# Patient Record
Sex: Male | Born: 1965 | Race: Black or African American | Hispanic: No | Smoking: Current some day smoker
Health system: Southern US, Community
[De-identification: ages and names within clinical notes are randomized; demographics above are authoritative.]

## PROBLEM LIST (undated history)

## (undated) DIAGNOSIS — M109 Gout, unspecified: Secondary | ICD-10-CM

## (undated) DIAGNOSIS — F1010 Alcohol abuse, uncomplicated: Secondary | ICD-10-CM

## (undated) DIAGNOSIS — G4733 Obstructive sleep apnea (adult) (pediatric): Secondary | ICD-10-CM

## (undated) DIAGNOSIS — Z91148 Patient's other noncompliance with medication regimen for other reason: Secondary | ICD-10-CM

## (undated) DIAGNOSIS — I2699 Other pulmonary embolism without acute cor pulmonale: Secondary | ICD-10-CM

## (undated) DIAGNOSIS — I4891 Unspecified atrial fibrillation: Secondary | ICD-10-CM

## (undated) DIAGNOSIS — F411 Generalized anxiety disorder: Secondary | ICD-10-CM

## (undated) DIAGNOSIS — E669 Obesity, unspecified: Secondary | ICD-10-CM

## (undated) DIAGNOSIS — E119 Type 2 diabetes mellitus without complications: Secondary | ICD-10-CM

## (undated) DIAGNOSIS — I5022 Chronic systolic (congestive) heart failure: Secondary | ICD-10-CM

## (undated) DIAGNOSIS — R609 Edema, unspecified: Secondary | ICD-10-CM

## (undated) DIAGNOSIS — R0602 Shortness of breath: Secondary | ICD-10-CM

## (undated) DIAGNOSIS — F99 Mental disorder, not otherwise specified: Secondary | ICD-10-CM

## (undated) DIAGNOSIS — I509 Heart failure, unspecified: Secondary | ICD-10-CM

## (undated) DIAGNOSIS — Z9114 Patient's other noncompliance with medication regimen: Secondary | ICD-10-CM

## (undated) HISTORY — DX: Chronic systolic (congestive) heart failure: I50.22

## (undated) HISTORY — DX: Patient's other noncompliance with medication regimen: Z91.14

## (undated) HISTORY — DX: Alcohol abuse, uncomplicated: F10.10

## (undated) HISTORY — DX: Edema, unspecified: R60.9

## (undated) HISTORY — DX: Obstructive sleep apnea (adult) (pediatric): G47.33

## (undated) HISTORY — DX: Unspecified atrial fibrillation: I48.91

## (undated) HISTORY — DX: Type 2 diabetes mellitus without complications: E11.9

## (undated) HISTORY — DX: Generalized anxiety disorder: F41.1

## (undated) HISTORY — DX: Mental disorder, not otherwise specified: F99

## (undated) HISTORY — DX: Obesity, unspecified: E66.9

## (undated) HISTORY — DX: Patient's other noncompliance with medication regimen for other reason: Z91.148

## (undated) HISTORY — PX: PACEMAKER INSERTION: SHX728

## (undated) HISTORY — DX: Shortness of breath: R06.02

## (undated) HISTORY — PX: TESTICLE SURGERY: SHX794

## (undated) HISTORY — PX: CARDIAC CATHETERIZATION: SHX172

---

## 2002-05-29 ENCOUNTER — Inpatient Hospital Stay (HOSPITAL_COMMUNITY): Admission: AD | Admit: 2002-05-29 | Discharge: 2002-05-30 | Payer: Self-pay | Admitting: General Surgery

## 2002-10-19 ENCOUNTER — Encounter: Payer: Self-pay | Admitting: Emergency Medicine

## 2002-10-19 ENCOUNTER — Observation Stay (HOSPITAL_COMMUNITY): Admission: EM | Admit: 2002-10-19 | Discharge: 2002-10-20 | Payer: Self-pay | Admitting: Emergency Medicine

## 2003-01-08 ENCOUNTER — Emergency Department (HOSPITAL_COMMUNITY): Admission: EM | Admit: 2003-01-08 | Discharge: 2003-01-08 | Payer: Self-pay | Admitting: Internal Medicine

## 2003-01-08 ENCOUNTER — Encounter: Payer: Self-pay | Admitting: Internal Medicine

## 2003-10-06 ENCOUNTER — Emergency Department (HOSPITAL_COMMUNITY): Admission: EM | Admit: 2003-10-06 | Discharge: 2003-10-06 | Payer: Self-pay | Admitting: *Deleted

## 2003-11-01 ENCOUNTER — Ambulatory Visit (HOSPITAL_COMMUNITY): Admission: RE | Admit: 2003-11-01 | Discharge: 2003-11-01 | Payer: Self-pay | Admitting: Family Medicine

## 2004-11-18 ENCOUNTER — Ambulatory Visit: Payer: Self-pay | Admitting: Family Medicine

## 2005-03-13 ENCOUNTER — Ambulatory Visit: Payer: Self-pay | Admitting: Family Medicine

## 2005-03-13 ENCOUNTER — Ambulatory Visit: Payer: Self-pay | Admitting: Gastroenterology

## 2005-03-13 ENCOUNTER — Observation Stay (HOSPITAL_COMMUNITY): Admission: EM | Admit: 2005-03-13 | Discharge: 2005-03-14 | Payer: Self-pay | Admitting: Emergency Medicine

## 2005-04-29 ENCOUNTER — Emergency Department (HOSPITAL_COMMUNITY): Admission: EM | Admit: 2005-04-29 | Discharge: 2005-04-29 | Payer: Self-pay | Admitting: Emergency Medicine

## 2005-06-25 ENCOUNTER — Ambulatory Visit: Payer: Self-pay | Admitting: Cardiology

## 2005-08-27 ENCOUNTER — Ambulatory Visit: Payer: Self-pay | Admitting: Cardiology

## 2005-09-17 ENCOUNTER — Ambulatory Visit: Payer: Self-pay | Admitting: Cardiology

## 2007-02-03 ENCOUNTER — Ambulatory Visit: Payer: Self-pay | Admitting: Cardiology

## 2007-02-09 ENCOUNTER — Ambulatory Visit: Payer: Self-pay | Admitting: Cardiology

## 2007-02-09 ENCOUNTER — Inpatient Hospital Stay (HOSPITAL_BASED_OUTPATIENT_CLINIC_OR_DEPARTMENT_OTHER): Admission: RE | Admit: 2007-02-09 | Discharge: 2007-02-09 | Payer: Self-pay | Admitting: Cardiology

## 2007-02-16 ENCOUNTER — Ambulatory Visit: Payer: Self-pay | Admitting: Cardiology

## 2007-05-23 ENCOUNTER — Encounter: Payer: Self-pay | Admitting: Cardiology

## 2007-05-24 ENCOUNTER — Encounter: Payer: Self-pay | Admitting: Physician Assistant

## 2007-05-24 ENCOUNTER — Ambulatory Visit: Payer: Self-pay | Admitting: Cardiology

## 2007-09-28 ENCOUNTER — Ambulatory Visit: Payer: Self-pay | Admitting: Cardiology

## 2007-10-08 ENCOUNTER — Ambulatory Visit: Payer: Self-pay | Admitting: Cardiology

## 2008-01-20 ENCOUNTER — Emergency Department (HOSPITAL_COMMUNITY): Admission: EM | Admit: 2008-01-20 | Discharge: 2008-01-21 | Payer: Self-pay | Admitting: Emergency Medicine

## 2008-01-27 ENCOUNTER — Ambulatory Visit (HOSPITAL_COMMUNITY): Payer: Self-pay | Admitting: Psychiatry

## 2008-02-03 ENCOUNTER — Ambulatory Visit (HOSPITAL_COMMUNITY): Payer: Self-pay | Admitting: Psychiatry

## 2008-02-09 ENCOUNTER — Ambulatory Visit (HOSPITAL_COMMUNITY): Payer: Self-pay | Admitting: Psychiatry

## 2008-02-10 ENCOUNTER — Ambulatory Visit (HOSPITAL_COMMUNITY): Payer: Self-pay | Admitting: Psychiatry

## 2008-02-16 ENCOUNTER — Ambulatory Visit (HOSPITAL_COMMUNITY): Payer: Self-pay | Admitting: Psychiatry

## 2008-02-23 ENCOUNTER — Ambulatory Visit (HOSPITAL_COMMUNITY): Payer: Self-pay | Admitting: Psychiatry

## 2008-02-24 ENCOUNTER — Ambulatory Visit (HOSPITAL_COMMUNITY): Payer: Self-pay | Admitting: Psychiatry

## 2008-03-01 ENCOUNTER — Ambulatory Visit (HOSPITAL_COMMUNITY): Payer: Self-pay | Admitting: Psychiatry

## 2008-03-23 ENCOUNTER — Ambulatory Visit (HOSPITAL_COMMUNITY): Payer: Self-pay | Admitting: Psychiatry

## 2008-04-06 ENCOUNTER — Ambulatory Visit (HOSPITAL_COMMUNITY): Payer: Self-pay | Admitting: Psychiatry

## 2008-06-20 ENCOUNTER — Encounter: Payer: Self-pay | Admitting: Cardiology

## 2009-02-20 ENCOUNTER — Encounter: Payer: Self-pay | Admitting: Cardiology

## 2009-11-20 ENCOUNTER — Encounter: Payer: Self-pay | Admitting: Family Medicine

## 2010-03-19 ENCOUNTER — Encounter: Payer: Self-pay | Admitting: Cardiology

## 2010-03-19 ENCOUNTER — Emergency Department (HOSPITAL_COMMUNITY): Admission: EM | Admit: 2010-03-19 | Discharge: 2010-03-19 | Payer: Self-pay | Admitting: Emergency Medicine

## 2010-03-20 ENCOUNTER — Inpatient Hospital Stay (HOSPITAL_COMMUNITY): Admission: EM | Admit: 2010-03-20 | Discharge: 2010-03-22 | Payer: Self-pay | Admitting: Emergency Medicine

## 2010-03-20 ENCOUNTER — Encounter: Payer: Self-pay | Admitting: Cardiology

## 2010-03-22 ENCOUNTER — Encounter: Payer: Self-pay | Admitting: Cardiology

## 2010-06-11 DIAGNOSIS — I5042 Chronic combined systolic (congestive) and diastolic (congestive) heart failure: Secondary | ICD-10-CM | POA: Insufficient documentation

## 2010-06-11 DIAGNOSIS — I10 Essential (primary) hypertension: Secondary | ICD-10-CM | POA: Insufficient documentation

## 2010-06-11 DIAGNOSIS — K219 Gastro-esophageal reflux disease without esophagitis: Secondary | ICD-10-CM | POA: Insufficient documentation

## 2010-06-11 DIAGNOSIS — G4733 Obstructive sleep apnea (adult) (pediatric): Secondary | ICD-10-CM | POA: Insufficient documentation

## 2010-06-11 DIAGNOSIS — R079 Chest pain, unspecified: Secondary | ICD-10-CM | POA: Insufficient documentation

## 2010-06-11 DIAGNOSIS — I5040 Unspecified combined systolic (congestive) and diastolic (congestive) heart failure: Secondary | ICD-10-CM | POA: Insufficient documentation

## 2010-06-12 ENCOUNTER — Ambulatory Visit: Payer: Self-pay | Admitting: Cardiology

## 2010-06-12 DIAGNOSIS — I5043 Acute on chronic combined systolic (congestive) and diastolic (congestive) heart failure: Secondary | ICD-10-CM | POA: Insufficient documentation

## 2010-06-12 DIAGNOSIS — F411 Generalized anxiety disorder: Secondary | ICD-10-CM | POA: Insufficient documentation

## 2010-06-14 ENCOUNTER — Telehealth (INDEPENDENT_AMBULATORY_CARE_PROVIDER_SITE_OTHER): Payer: Self-pay | Admitting: *Deleted

## 2010-06-14 ENCOUNTER — Ambulatory Visit: Payer: Self-pay | Admitting: Cardiology

## 2010-06-26 ENCOUNTER — Encounter: Payer: Self-pay | Admitting: Cardiology

## 2010-06-27 ENCOUNTER — Telehealth (INDEPENDENT_AMBULATORY_CARE_PROVIDER_SITE_OTHER): Payer: Self-pay | Admitting: *Deleted

## 2010-06-27 ENCOUNTER — Inpatient Hospital Stay (HOSPITAL_COMMUNITY): Admission: AD | Admit: 2010-06-27 | Discharge: 2010-07-02 | Payer: Self-pay | Admitting: Cardiology

## 2010-06-27 ENCOUNTER — Encounter: Payer: Self-pay | Admitting: Internal Medicine

## 2010-06-27 ENCOUNTER — Ambulatory Visit: Payer: Self-pay | Admitting: Cardiology

## 2010-07-02 ENCOUNTER — Encounter: Payer: Self-pay | Admitting: Internal Medicine

## 2010-07-08 ENCOUNTER — Encounter: Payer: Self-pay | Admitting: Cardiology

## 2010-07-19 ENCOUNTER — Ambulatory Visit: Payer: Self-pay | Admitting: Cardiology

## 2010-07-29 ENCOUNTER — Telehealth (INDEPENDENT_AMBULATORY_CARE_PROVIDER_SITE_OTHER): Payer: Self-pay | Admitting: *Deleted

## 2010-07-31 ENCOUNTER — Encounter: Payer: Self-pay | Admitting: Cardiology

## 2010-08-13 ENCOUNTER — Encounter: Payer: Self-pay | Admitting: Cardiology

## 2010-09-11 ENCOUNTER — Telehealth (INDEPENDENT_AMBULATORY_CARE_PROVIDER_SITE_OTHER): Payer: Self-pay | Admitting: *Deleted

## 2010-09-12 ENCOUNTER — Encounter: Payer: Self-pay | Admitting: Cardiology

## 2010-09-13 ENCOUNTER — Ambulatory Visit: Payer: Self-pay | Admitting: Cardiology

## 2010-09-14 ENCOUNTER — Encounter: Payer: Self-pay | Admitting: Cardiology

## 2010-09-16 ENCOUNTER — Telehealth (INDEPENDENT_AMBULATORY_CARE_PROVIDER_SITE_OTHER): Payer: Self-pay | Admitting: *Deleted

## 2010-09-18 ENCOUNTER — Ambulatory Visit: Payer: Self-pay | Admitting: Cardiology

## 2010-09-18 DIAGNOSIS — I4891 Unspecified atrial fibrillation: Secondary | ICD-10-CM | POA: Insufficient documentation

## 2010-10-30 ENCOUNTER — Ambulatory Visit: Payer: Self-pay | Admitting: Cardiology

## 2010-11-13 ENCOUNTER — Telehealth (INDEPENDENT_AMBULATORY_CARE_PROVIDER_SITE_OTHER): Payer: Self-pay | Admitting: *Deleted

## 2010-12-21 ENCOUNTER — Encounter: Payer: Self-pay | Admitting: Family Medicine

## 2010-12-22 ENCOUNTER — Encounter: Payer: Self-pay | Admitting: Family Medicine

## 2010-12-31 NOTE — Miscellaneous (Signed)
Summary: Orders Update - cardiac rehab  Clinical Lists Changes  Orders: Added new Referral order of Cardiac Rehabilitation (Cardiac Rehab) - Signed 

## 2010-12-31 NOTE — Progress Notes (Signed)
Summary: panic attacks  Phone Note Call from Patient   Summary of Call: States having really bad panic attacks lately over last 2 weeks.  Feelis like heart is racing.  Not sleeping good at night due to this.   Hoover Brunette, LPN  September 11, 2010 3:19 PM   Called back stating he was on his way to ER and wanted to let Dr. Andee Lineman know.  Having chest pain and sweating.  Does not have NTG, but did take ASA.  Wife driving him.  Advised him he was doing the correct thing and that I would let MD know.   Initial call taken by: Hoover Brunette, LPN,  September 12, 2010 3:53 PM  Follow-up for Phone Call        taken care of the hospital Follow-up by: Lewayne Bunting, MD, [facility],  September 13, 2010 11:59 AM

## 2010-12-31 NOTE — Assessment & Plan Note (Signed)
Summary: eph d/c [facility] 8-2   Visit Type:  Follow-up Primary Provider:  [name]   History of Present Illness: the patient was recently admitted to [facility] for increased shortness of breath in the setting of nonischemic cardio myopathy with an ejection fraction of 30%.  The patient was electively admitted for dobutamine infusion.  There were also some minor adjustments made in his medications.  Unfortunately patient has a history of medication nonadherence.  He states it is feeling much better after his recent hospitalization.  He denies any palpitations or syncope.  There is some improvement in his edema. is currently taking torsemide.  The patient wants a referral to cardiac rehab.  He also reports constipation.  Preventive Screening-Counseling & Management  Alcohol-Tobacco     Smoking Status: current     Smoking Cessation Counseling: yes     Packs/Day: 1 PPD  Current Medications (verified): 1)  Amlodipine Besylate 10 Mg Tabs (Amlodipine Besylate) .... Take 1 Tablet By Mouth Once A Day 2)  Aspir-Low 81 Mg Tbec (Aspirin) .... Take 1 Tablet By Mouth Once A Day 3)  Carvedilol 6.25 Mg Tabs (Carvedilol) .... Take 1 Tablet By Mouth Two Times A Day 4)  Diovan 80 Mg Tabs (Valsartan) .... Take 1 Tablet By Mouth Two Times A Day 5)  Excedrin Migraine 250-250-65 Mg Tabs (Aspirin-Acetaminophen-Caffeine) .... As Needed 6)  Pantoprazole Sodium 40 Mg Tbec (Pantoprazole Sodium) .... Take 1 Tablet By Mouth Once A Day 7)  Torsemide 20 Mg Tabs (Torsemide) .... Take 1 Tablet By Mouth Once A Day 8)  Clonazepam 0.5 Mg Tabs (Clonazepam) .... Take 1 Tablet By Mouth Two Times A Day 9)  Spironolactone 25 Mg Tabs (Spironolactone) .... Take 1 Tablet By Mouth Once A Day 10)  Clonazepam 0.5 Mg Tbdp (Clonazepam) .... Take 1 Tablet By Mouth Two Times A Day 11)  Miralax  Powd (Polyethylene Glycol 3350) .... Take 17gm Daily, Mix With 8oz Water  Allergies (verified): 1)  ! Ace Inhibitors 2)  ! *  Buspirone  Comments:  Nurse/Medical Assistant: The patient's medication bottles and allergies were reviewed with the patient and were updated in the Medication and Allergy Lists.  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  The patient denies fatigue, malaise, fever, vision loss, decreased hearing, hoarseness, chest pain, palpitations, prolonged cough, wheezing, sleep apnea, coughing up blood, abdominal pain, blood in stool, nausea, vomiting, diarrhea, heartburn, incontinence, blood in urine, muscle weakness, joint pain, rash, skin lesions, headache, fainting, dizziness, depression, anxiety, enlarged lymph nodes, easy bruising or bleeding, and environmental allergies.    Vital Signs:  Patient profile:   45 year old male Height:      68 inches Weight:      416 pounds Pulse rate:   83 / minute BP sitting:   133 / 83  (left arm) Cuff size:   large  Vitals Entered By: Carlye Grippe (July 19, 2010 1:26 PM)  Physical Exam  Additional Exam:  General: Well-developed, well-nourished in no distress head: Normocephalic and atraumatic eyes PERRLA/EOMI intact, conjunctiva and lids normal nose: No deformity or lesions mouth normal dentition, normal posterior pharynx neck: Supple,  no JVD.  No masses, thyromegaly or abnormal cervical nodes lungs: Normal breath sounds bilaterally without wheezing.  Normal percussion heart: regular rate and rhythm with normal S1 and S2, no S3 or S4.  PMI is normal.  No pathological murmurs abdomen: Normal bowel sounds, abdomen is  soft and nontender without masses, organomegaly or hernias noted.  No hepatosplenomegaly musculoskeletal: Back normal, normal gait muscle strength and tone normal pulsus: Pulse is normal in all 4 extremities Extremities: 2+ peripheral pitting edema neurologic: Alert and oriented x 3 skin: Intact without lesions or rashes cervical nodes: No significant adenopathy psychologic: Normal affect    Impression & Recommendations:  Problem # 1:  ACUTE ON CHRONIC SYSTOLIC HEART FAILURE (ICD-428.23) improved with intravenous dobutamine.  Patient NYHA class two.  Continue medical therapywill have discussions in the future whether the patient could be a candidate for ICD.  However he has history of significant noncompliance and we want to further evaluate improvement ejection fraction of medical therapy His updated medication list for this problem includes:    Amlodipine Besylate 10 Mg Tabs (Amlodipine besylate) .Marland Kitchen... Take 1 tablet by mouth once a day    Aspir-low 81 Mg Tbec (Aspirin) .Marland Kitchen... Take 1 tablet by mouth once a day    Carvedilol 6.25 Mg Tabs (Carvedilol) .Marland Kitchen... Take 1 tablet by mouth two times a day    Diovan 80 Mg Tabs (Valsartan) .Marland Kitchen... Take 1 tablet by mouth two times a day    Torsemide 20 Mg Tabs (Torsemide) .Marland Kitchen... Take 1 tablet by mouth once a day    Spironolactone 25 Mg Tabs (Spironolactone) .Marland Kitchen... Take 1 tablet by mouth once a day  Problem # 2:  UNSPECIFIED ESSENTIAL HYPERTENSION (ICD-401.9) blood pressure is controlled.  Continue current medical therapy His updated medication list for this problem includes:    Amlodipine Besylate 10 Mg Tabs (Amlodipine besylate) .Marland Kitchen... Take 1 tablet by mouth once a day    Aspir-low 81 Mg Tbec (Aspirin) .Marland Kitchen... Take 1 tablet by mouth once a day    Carvedilol 6.25 Mg Tabs (Carvedilol) .Marland Kitchen... Take 1 tablet by mouth two times a day    Diovan 80 Mg Tabs (Valsartan) .Marland Kitchen... Take 1 tablet by mouth two times a day    Torsemide 20 Mg Tabs (Torsemide) .Marland Kitchen... Take 1  tablet by mouth once a day    Spironolactone 25 Mg Tabs (Spironolactone) .Marland Kitchen... Take 1 tablet by mouth once a day  Problem # 3:  MORBID OBESITY (ICD-278.01) we discussed weight reduction.  Problem # 4:  UNSPECIFIED SLEEP APNEA (ICD-780.57) the patient needs to be compliant with his CPAP device.  Explained to him that this is contributing to his heart failure symptoms.  Problem # 5:  ANXIETY STATE, UNSPECIFIED (ICD-300.00) patient a refill on his clonazepam.  Patient Instructions: 1)  Clonazepam 0.5mg  two times a day (60 tabs + 1 refill) 2)  Miralax 17gm daily 3)  Referral to Cardiac Rehab 4)  Follow up in  6 months  Prescriptions: MIRALAX  POWD (POLYETHYLENE GLYCOL 3350) take 17gm daily, mix with 8oz water  #1 bottle x 6   Entered by:   Hoover Brunette, LPN   Authorized by:   Lewayne Bunting, MD, [facility]   Signed by:   Hoover Brunette, LPN on 16/09/9603   Method used:   Electronically to        [facility]  [id number] (retail)       [address]  [place], [place]  [address]       Ph: [phone] or [phone]       Fax: [phone]   RxID:   [id number]   Handout requested. CLONAZEPAM 0.5 MG TBDP (CLONAZEPAM) Take 1 tablet by mouth two times a day  #60 x 1   Entered by:   Hoover Brunette, LPN   Authorized by:   Lewayne Bunting, MD, [facility]   Signed by:   Hoover Brunette, LPN on 66/05/3015   Method used:   Print then Give to Patient   RxID:   [id number]   Handout requested.

## 2010-12-31 NOTE — Assessment & Plan Note (Signed)
Summary: f6w  --agh   Visit Type:  Follow-up Primary Provider:  [name]   History of Present Illness: the patient is a 45 year old male with history of nonischemic cardiomyopathy, ejection fraction approximately 25-30%. The patient was admitted to September 12, 2010 with rapid atrial fibrillation panic attacks. He has a CHADS2 of 2 but has been noncompliant with medical therapy and is not felt to be a particular good candidate for Coumadin. The patient is currently back in normal sinus rhythm.  the patient has been treated also for significant anxiety and has much improved with clonazepam and citalopram. Today however he complains of a severe headache which has been going on for several days on the left side of his face radiating to the back and neck associated with some blurred vision. The patient states that he has had migraine headaches in the past. This episode however typically severe.  The patient remains in normal sinus rhythm on EKG. From a heart failure standpoint is doing much better. He has no chest pain short of breath orthopnea PND.  Preventive Screening-Counseling & Management  Alcohol-Tobacco     Smoking Status: current     Smoking Cessation Counseling: yes     Packs/Day: 1/2 PPD  Current Medications (verified): 1)  Amlodipine Besylate 10 Mg Tabs (Amlodipine Besylate) .... Take 1 Tablet By Mouth Once A Day 2)  Aspir-Low 81 Mg Tbec (Aspirin) .... Take 1 Tablet By Mouth Once A Day 3)  Diovan 80 Mg Tabs (Valsartan) .... Take 1 Tablet By Mouth Two Times A Day 4)  Excedrin Migraine 250-250-65 Mg Tabs (Aspirin-Acetaminophen-Caffeine) .... As Needed 5)  Pantoprazole Sodium 40 Mg Tbec (Pantoprazole Sodium) .... Take 1 Tablet By Mouth Once A Day 6)  Torsemide 20 Mg Tabs (Torsemide) .... Take 1 Tablet By Mouth Once A Day 7)  Spironolactone 25 Mg Tabs (Spironolactone) .... Take 1 Tablet By Mouth Once A Day 8)  Clonazepam 1 Mg Tabs (Clonazepam) .... Take One Tab Three Times A Day 9)   Miralax  Powd (Polyethylene Glycol 3350) .... Take 17gm Daily, Mix With 8oz Water 10)  Metoprolol Succinate 50 Mg Xr24h-Tab (Metoprolol Succinate) .... Take 1 Tablet By Mouth Two Times A Day 11)  Citalopram Hydrobromide 20 Mg Tabs (Citalopram Hydrobromide) .... Take 1 Tablet By Mouth Once A Day 12)  Percocet 7.5-500 Mg Tabs (Oxycodone-Acetaminophen) .... One Tab Every 6 Hours As Needed For Severe Headache  Allergies (verified): 1)  ! Ace Inhibitors 2)  ! * Buspirone  Comments:  Nurse/Medical Assistant: The patient's medication bottles and allergies were reviewed with the patient and were updated in the Medication and Allergy Lists.  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      Social History: Last updated: 06/11/2010 Patient has 3 children and disabled Tobacco Use - Yes.  Alcohol Use - yes  Risk Factors: Smoking Status: current (10/30/2010) Packs/Day: 1/2 PPD (10/30/2010)  Social History: Packs/Day:  1/2 PPD  Review of Systems  The patient complains of weight gain/loss, headache, and anxiety.  The patient denies fatigue, malaise, fever, vision loss, decreased hearing, hoarseness, chest pain, palpitations, shortness of breath, prolonged cough, wheezing, sleep apnea, coughing up blood, abdominal pain, blood in  stool, nausea, vomiting, diarrhea, heartburn, incontinence, blood in urine, muscle weakness, joint pain, leg swelling, rash, skin lesions, fainting, depression, enlarged lymph nodes, easy bruising or bleeding, and environmental allergies.    Vital Signs:  Patient profile:   45 year old male Height:      68 inches Weight:      416 pounds Pulse rate:   66 / minute BP sitting:   135 / 90  (left arm) Cuff size:   large  Vitals Entered By: Carlye Grippe (October 30, 2010 2:12 PM)  Physical Exam  Additional Exam:  General: Well-developed, well-nourished in no distress head: Normocephalic and atraumatic eyes PERRLA/EOMI intact, conjunctiva and lids normal nose: No deformity or lesions mouth normal dentition, normal posterior pharynx neck: Supple, no JVD.  No masses, thyromegaly or abnormal cervical nodes lungs: Normal breath sounds bilaterally without wheezing.  Normal percussion heart: regular rate and rhythm with normal S1 and S2, no S3 or S4.  PMI is normal.  No pathological murmurs abdomen: Normal bowel sounds, abdomen is soft and nontender without masses, organomegaly or hernias noted.  No hepatosplenomegaly musculoskeletal: Back normal, normal gait muscle strength and tone normal pulsus: Pulse is normal in all 4 extremities Extremities: 1+ peripheral pitting edema neurologic: Alert and oriented x 3 skin: Intact without lesions or rashes cervical nodes: No significant adenopathy psychologic: Normal affect    EKG  Procedure date:  10/30/2010  Findings:      normal sinus rhythm. Left ventricle hypertrophy. Nonspecific T-wave changes. Heart rate 63 beats per minute  Impression & Recommendations:  Problem # 1:  ATRIAL FIBRILLATION, PAROXYSMAL (ICD-427.31) patient remains in normal sinus rhythm. If he remains compliant with his current medical regimen which he has been for a while now and we can consider the issue of Coumadin. His updated medication list for this problem  includes:    Aspir-low 81 Mg Tbec (Aspirin) .Marland Kitchen... Take 1 tablet by mouth once a day    Metoprolol Succinate 50 Mg Xr24h-tab (Metoprolol succinate) .Marland Kitchen... Take 1 tablet by mouth two times a day  Orders: EKG w/ Interpretation (93000)  Problem # 2:  ANXIETY STATE, UNSPECIFIED (ICD-300.00) anxiety is markedly improved. The patient will continue clonazepam and citalopram  Problem # 3:  ACUTE ON CHRONIC SYSTOLIC HEART FAILURE (ICD-428.23) ocular symptoms are stable. He is a good medical regimen. If he remains compliant with also readdress the issue of ICD placement. I encouraged the patient meanwhile to exercise and is able to walk 2 miles during several days of the week. His updated medication list for this problem includes:    Amlodipine Besylate 10 Mg Tabs (Amlodipine besylate) .Marland Kitchen... Take 1 tablet by mouth once a day    Aspir-low 81 Mg Tbec (Aspirin) .Marland Kitchen... Take 1 tablet by mouth once a day    Diovan 80 Mg Tabs (Valsartan) .Marland Kitchen... Take 1 tablet by mouth two times a day    Torsemide 20 Mg Tabs (Torsemide) .Marland Kitchen... Take 1 tablet by mouth once a day    Spironolactone 25 Mg Tabs (Spironolactone) .Marland Kitchen... Take 1 tablet by mouth once a day    Metoprolol Succinate 50 Mg Xr24h-tab (Metoprolol succinate) .Marland Kitchen... Take 1 tablet by mouth two times a day  Patient Instructions: 1)  Percocet 7.5/500mg  for migrain 2)  Follow up in  6 months Prescriptions: PERCOCET 7.5-500 MG TABS (OXYCODONE-ACETAMINOPHEN) one tab every 6 hours as needed for severe headache  #10 x 0   Entered by:   Hoover Brunette, LPN   Authorized by:   Lewayne Bunting, MD, [facility]   Signed by:   Hoover Brunette, LPN on 16/09/9603   Method used:   Print then Give to Patient   RxID:   [id number]

## 2010-12-31 NOTE — Progress Notes (Signed)
Summary: Order for cardiac rehab  Phone Note Call from Patient Call back at Home Phone [phone]   Reason for Call: Talk to Nurse Summary of Call: Patient said we were to refer him to cardiac rehab. He called to say they have not recieved the order. Let him know when it is sent. Initial call taken by: Dorise Hiss,  July 29, 2010 9:06 AM  Follow-up for Phone Call        Patient notified.    Hoover Brunette, LPN  July 31, 2010 12:15 PM

## 2010-12-31 NOTE — Consult Note (Signed)
Summary: CARDIOLOGY CONSULT/ [facility]  CARDIOLOGY CONSULT/ [facility]   Imported By: Zachary George 06/11/2010 17:55:04  _____________________________________________________________________  External Attachment:    Type:   Image     Comment:   External Document

## 2010-12-31 NOTE — Assessment & Plan Note (Signed)
Summary: 2 yr fu-was inpt [facility] april 2011   Visit Type:  Follow-up Primary Provider:  [name]   History of Present Illness: the patient is a 45 year old African-American male with history of nonischemic cardiomyopathy.  The patient had a normal prior cardiac catheterization.  He has a history of alcohol use and has been noncompliant in the past.  He also has a component of hypertrophic cardiomyopathy.  Ejection fraction is 30%.  The patient has morbid obesity and marked deconditioning.  He also has obstructive sleep apnea and is on CPAP.  The patient is currently on disability.  He reports significant anxiety.  His increased shortness of breath and evidence of increased volume overload.  However is very worried about his health and has developed episodes of agoraphobia and claustrophobia.  He is ruminating thoughts insomnia.  Reportedly has been tried on multiple medications by his primary care physician including Seroquel, Thorazine as well as citalopram the latter which made suicidal.  The patient now has mainly anxiety.  Cardiovascular standpoint is very short of breath on minimal exertion denies chest pain.  Preventive Screening-Counseling & Management  Alcohol-Tobacco     Smoking Status: current     Smoking Cessation Counseling: yes     Packs/Day: 1 PPD  Current Medications (verified): 1)  Amlodipine Besylate 10 Mg Tabs (Amlodipine Besylate) .... Take 1 Tablet By Mouth Once A Day 2)  Aspir-Low 81 Mg Tbec (Aspirin) .... Take 1 Tablet By Mouth Once A Day 3)  Carvedilol 12.5 Mg Tabs (Carvedilol) .... Take 1 Tablet By Mouth Two Times A Day 4)  Diovan Hct 160-25 Mg Tabs (Valsartan-Hydrochlorothiazide) .... Take 1 Tablet By Mouth Once A Day 5)  Excedrin Migraine 250-250-65 Mg Tabs (Aspirin-Acetaminophen-Caffeine) .... As Needed 6)  Klor-Con M20 20 Meq Cr-Tabs (Potassium Chloride Crys Cr) .... Take 1 Tablet By Mouth Once A Day 7)  Pantoprazole Sodium 40 Mg Tbec (Pantoprazole Sodium) ....  Take 1 Tablet By Mouth Once A Day 8)  Torsemide 20 Mg Tabs (Torsemide) .... Take 1 Tablet By Mouth Once A Day 9)  Clonazepam 0.5 Mg Tabs (Clonazepam) .... Take 1/2 Tab (0.25mg ) Two Times A Day  Allergies (verified): 1)  ! Ace Inhibitors 2)  ! * Buspirone  Comments:  Nurse/Medical Assistant: The patient's medication bottles and allergies were reviewed with the patient and were updated in the Medication and Allergy Lists.  Past History:  Past Medical History: Last updated: 06/11/2010 Severe nonischemic cardiomypathy EF 30% with CWP 19 Medication noncompliance Alcohol abuse Chronic systolic heart failure Hypertension Obstructive sleep apnea Morbid obesity Psychiatric disorder Hypertriglyceridemia chronic lower extremity edema  Past Surgical History: Last updated: 06/11/2010 Cardiac Catheterization 2008  Family History: Last updated: 06/11/2010   There is history of hypertension, stroke, and diabetes   runs in his family.      Social History: Last updated: 06/11/2010 Patient has 3 children and disabled Tobacco Use - Yes.  Alcohol Use - yes  Risk Factors: Smoking Status: current (06/12/2010) Packs/Day: 1 PPD (06/12/2010)  Social History: Packs/Day:  1 PPD  Review of Systems       The patient complains of weight gain/loss, shortness of breath, sleep apnea, and anxiety.  The patient denies fatigue, malaise, fever, vision loss, decreased hearing, hoarseness, chest pain, palpitations, prolonged cough, wheezing, coughing up blood, abdominal pain, blood in stool, nausea, vomiting, diarrhea, heartburn, incontinence, blood in urine, muscle weakness, joint pain, leg swelling, rash, skin lesions, headache, fainting, dizziness, depression, enlarged lymph nodes, easy bruising or bleeding, and environmental  allergies.    Vital Signs:  Patient profile:   45 year old male Height:      68 inches Weight:      415 pounds BMI:     63.33 Pulse rate:   80 / minute BP sitting:    120 / 78  (left arm) Cuff size:   large  Vitals Entered By: Carlye Grippe (June 12, 2010 2:11 PM)  Nutrition Counseling: Patient's BMI is greater than 25 and therefore counseled on weight management options.  Physical Exam  Additional Exam:  General: Well-developed, well-nourished in no distress head: Normocephalic and atraumatic eyes PERRLA/EOMI intact, conjunctiva and lids normal nose: No deformity or lesions mouth normal dentition, normal posterior pharynx neck: Supple, no JVD.  No masses, thyromegaly or abnormal cervical nodes lungs: Normal breath sounds bilaterally without wheezing.  Normal percussion heart: regular rate and rhythm with normal S1 and S2, no S3 or S4.  PMI is normal.  No pathological murmurs abdomen: Normal bowel sounds, abdomen is soft and nontender without masses, organomegaly or hernias noted.  No hepatosplenomegaly musculoskeletal: Back normal, normal gait muscle strength and tone normal pulsus: Pulse is normal in all 4 extremities Extremities: 2+ peripheral pitting edema neurologic: Alert and oriented x 3 skin: Intact without lesions or rashes cervical nodes: No significant adenopathy psychologic: Normal affect    Impression & Recommendations:  Problem # 1:  ACUTE ON CHRONIC SYSTOLIC HEART FAILURE (ICD-428.23) will increase Diovan hydrochlorothiazide and have the patient follow up quickly as he may require intravenous Lasix.  We will also get an echocardiogram to reassess his LV function.  He has not abstained from alcohol he may be a candidate for ICD implantation. His updated medication list for this problem includes:    Amlodipine Besylate 10 Mg Tabs (Amlodipine besylate) .Marland Kitchen... Take 1 tablet by mouth once a day    Aspir-low 81 Mg Tbec (Aspirin) .Marland Kitchen... Take 1 tablet by mouth once a day    Carvedilol 12.5 Mg Tabs (Carvedilol) .Marland Kitchen... Take 1 tablet by mouth two times a day    Diovan Hct 160-25 Mg Tabs (Valsartan-hydrochlorothiazide) .Marland Kitchen... Take 1 tablet by  mouth once a day    Torsemide 20 Mg Tabs (Torsemide) .Marland Kitchen... Take 1 tablet by mouth once a day  Orders: 2-D Echocardiogram (2D Echo) T-Basic Metabolic Panel ([address]-60454) T-BNP  (B Natriuretic Peptide) (09811-91478)  Problem # 2:  MORBID OBESITY (ICD-278.01) I counseled the patient regarding his weight.  Unfortunately continues to smoke also  Problem # 3:  ANXIETY STATE, UNSPECIFIED (ICD-300.00) again the patient a limited prescription for clonazepam.  Short-term only  Patient Instructions: 1)  Clonazepam 0.25mg  two times a day  2)  Increase Diovan to 160/25mg  daily 3)  2D Echo 4)  Labs - can do with Echo  5)  Follow up in  4 weeks. Prescriptions: DIOVAN HCT 160-25 MG TABS (VALSARTAN-HYDROCHLOROTHIAZIDE) Take 1 tablet by mouth once a day  #30 x 6   Entered by:   Hoover Brunette, LPN   Authorized by:   Lewayne Bunting, MD, [facility]   Signed by:   Hoover Brunette, LPN on 29/56/2130   Method used:   [facility]  [address] [address] [id number]       [address]       [place], [place]  [address]       Ph: [phone] or [phone]  Fax: [phone]   RxID:   [id number] CLONAZEPAM 0.5 MG TABS (CLONAZEPAM) take 1/2 tab (0.25mg ) two times a day  #15 x 0   Entered by:   Hoover Brunette, LPN   Authorized by:   Lewayne Bunting, MD, [facility]   Signed by:   Hoover Brunette, LPN on 30/86/5784   Method used:   Print then Give to Patient   RxID:   [id number]   Handout requested.   Appended Document: 2 yr fu-was in[facility] april 2011 due to worsening of the patient's clinical condition and worsening heart failure as well as significant anxiety associated with this and agoraphobia, the patient is unable to be present in public places. At least for the time being until treatment has taken effect. The patient reported to me that he has a court appearance. I feel given his medical condition that this would be contraindicated to be present and the exposed to the stress associated  with this. The patient will first need treatment for his heart failure and agoraphobia. He can be released for his appearance at a later date. This date will need to be determined.

## 2010-12-31 NOTE — Letter (Signed)
Summary: External Correspondence/ FAXED PULMONARY REHAB REFERRAL  External Correspondence/ FAXED PULMONARY REHAB REFERRAL   Imported By: Dorise Hiss 08/13/2010 14:01:07  _____________________________________________________________________  External Attachment:    Type:   Image     Comment:   External Document

## 2010-12-31 NOTE — Progress Notes (Signed)
Summary: request court letter/dictation  Phone Note Call from Patient   Summary of Call: Patient called checking to see if Dr. Andee Lineman had done a letter for court.  Stated that he told him during his office viisit that he would do one.  Advised pt. that his dictation was complete, but no formal letter done.  Will provide copy of dictation for pt.  If not sufficient, he will let us know so we can forward to MD to request formal letter to be dictated.  Patient verbalized understanding.  Hoover Brunette, LPN  June 14, 2010 2:33 PM

## 2010-12-31 NOTE — Letter (Signed)
Summary: External Correspondence/ FAXED [facility]  External Correspondence/ FAXED [facility]   Imported By: Dorise Hiss 07/03/2010 16:38:25  _____________________________________________________________________  External Attachment:    Type:   Image     Comment:   External Document

## 2010-12-31 NOTE — Assessment & Plan Note (Signed)
Summary: clarify meds  --agh   Visit Type:  Follow-up Primary [name]:  [name]   History of Present Illness: the patient is a 45 year old male with history of nonischemic cardiomyopathy, ejection fraction approximately 25-30%. The patient was admitted to September 12, 2010 with rapid atrial fibrillation panic attacks. He has a CHADS2 of 2 but has been noncompliant with medical therapy and is not felt to be a particular good candidate for Coumadin. The patient is currently back in normal sinus rhythm. Several medications adjustments were made during his hospitalization but unfortunately they were not executed upon discharge. The plan was to increase metoprolol increase clonazepam. and add citalopram  for panic attacks. Fortunately the patient has had no more palpitations. He monitors his heart rate with a heart rate monitor. His BNP level during hospitalization was only 61 and clinically he has no congestive heart failure he also has normal renal function. He presents now for followup to straighten out his medical regimen.  Preventive Screening-Counseling & Management  Alcohol-Tobacco     Smoking Status: current     Smoking Cessation Counseling: yes     Packs/Day: 1 PPD  Current Medications (verified): 1)  Amlodipine Besylate 10 Mg Tabs (Amlodipine Besylate) .... Take 1 Tablet By Mouth Once A Day 2)  Aspir-Low 81 Mg Tbec (Aspirin) .... Take 1 Tablet By Mouth Once A Day 3)  Diovan 80 Mg Tabs (Valsartan) .... Take 1 Tablet By Mouth Two Times A Day 4)  Excedrin Migraine 250-250-65 Mg Tabs (Aspirin-Acetaminophen-Caffeine) .... As Needed 5)  Pantoprazole Sodium 40 Mg Tbec (Pantoprazole Sodium) .... Take 1 Tablet By Mouth Once A Day 6)  Torsemide 20 Mg Tabs (Torsemide) .... Take 1 Tablet By Mouth Once A Day 7)  Spironolactone 25 Mg Tabs (Spironolactone) .... Take 1 Tablet By Mouth Once A Day 8)  Clonazepam 1 Mg Tabs (Clonazepam) .... Take One Tab Three Times A Day 9)  Miralax  Powd (Polyethylene  Glycol 3350) .... Take 17gm Daily, Mix With 8oz Water 10)  Metoprolol Succinate 50 Mg Xr24h-Tab (Metoprolol Succinate) .... Take 1 Tablet By Mouth Two Times A Day 11)  Citalopram Hydrobromide 20 Mg Tabs (Citalopram Hydrobromide) .... Take 1 Tablet By Mouth Once A Day  Allergies (verified): 1)  ! Ace Inhibitors 2)  ! * Buspirone  Comments:  Nurse/Medical Assistant: The patient's medication bottles and allergies were reviewed with the patient and were updated in the Medication and Allergy Lists.  Past History:  Past Surgical History: Last updated: 06/11/2010 Cardiac Catheterization 2008  Family History: Last updated: 06/11/2010   There is history of hypertension, stroke, and diabetes   runs in his family.      Social History: Last updated: 06/11/2010 Patient has 3 children and disabled Tobacco Use - Yes.  Alcohol Use - yes  Risk Factors: Smoking Status: current (09/18/2010) Packs/Day: 1 PPD (09/18/2010)  Past Medical History: Severe nonischemic cardiomypathy EF 30% with CWP 19 Medication noncompliance Alcohol abuse Chronic systolic heart failure Hypertension Obstructive sleep apnea Morbid obesity Psychiatric disorder Hypertriglyceridemia chronic lower extremity edema elective admission to [facility] for intravenous dobutamine August 2011 Admission with rapid atrial fibrillation October 2011 at [place], BNP level 61 Creatinine October 2011 1.07  Review of Systems       The patient complains of fatigue, palpitations, and anxiety.  The patient denies malaise, fever, weight gain/loss, vision loss, decreased hearing, hoarseness, chest pain, shortness of breath, prolonged cough, wheezing, sleep apnea, coughing up blood, abdominal pain, blood in stool, nausea, vomiting,  diarrhea, heartburn, incontinence, blood in urine, muscle weakness, joint pain, leg swelling, rash, skin lesions, headache, fainting, dizziness, depression, enlarged lymph nodes, easy bruising or  bleeding, and environmental allergies.    Vital Signs:  Patient profile:   45 year old male Height:      68 inches Weight:      416 pounds Pulse rate:   63 / minute BP sitting:   129 / 85  (left arm) Cuff size:   large  Vitals Entered By: Carlye Grippe (September 18, 2010 9:53 AM)  Physical Exam  Additional Exam:  General: Well-developed, well-nourished in no distress head: Normocephalic and atraumatic eyes PERRLA/EOMI intact, conjunctiva and lids normal nose: No deformity or lesions mouth normal dentition, normal posterior pharynx neck: Supple, no JVD.  No masses, thyromegaly or abnormal cervical nodes lungs: Normal breath sounds bilaterally without wheezing.  Normal percussion heart: regular rate and rhythm with normal S1 and S2, no S3 or S4.  PMI is normal.  No pathological murmurs abdomen: Normal bowel sounds, abdomen is soft and nontender without masses, organomegaly or hernias noted.  No hepatosplenomegaly musculoskeletal: Back normal, normal gait muscle strength and tone normal pulsus: Pulse is normal in all 4 extremities Extremities: 1+ peripheral pitting edema neurologic: Alert and oriented x 3 skin: Intact without lesions or rashes cervical nodes: No significant adenopathy psychologic: Normal affect    Impression & Recommendations:  Problem # 1:  ANXIETY STATE, UNSPECIFIED (ICD-300.00) the patient still has significant anxiety. Unfortunately he's never been started on a medical regimen. We will increase clonazepam to 1 mg p.o. t.i.d. and since start citalopram 20 minutes p.o. q. daily. We will also increase his Toprol to 50 minutes p.o. b.i.d. which could help also with anxiety. We will have to monitor his heart rate and he does not tolerate this regimen then we can add digoxin  Problem # 2:  ATRIAL FIBRILLATION, PAROXYSMAL (ICD-427.31) the patient has paroxysmal atrial ablation and ideally could benefit from Coumadin. However we've already instituted several  medication changes and the patient has a history of noncompliance. We make a definite decision during his next clinic visit in 4-6 weeks we need to start Coumadin. His CHADS2 is 2 The following medications were removed from the medication list:    Carvedilol 6.25 Mg Tabs (Carvedilol) .Marland Kitchen... Take 1 tablet by mouth two times a day His updated medication list for this problem includes:    Aspir-low 81 Mg Tbec (Aspirin) .Marland Kitchen... Take 1 tablet by mouth once a day    Metoprolol Succinate 50 Mg Xr24h-tab (Metoprolol succinate) .Marland Kitchen... Take 1 tablet by mouth two times a day  Problem # 3:  ACUTE ON CHRONIC SYSTOLIC HEART FAILURE (ICD-428.23) patient has chronic systolic heart failure. He is currently not volume overloaded. He has very little peripheral edema and his BNP level is 61. The following medications were removed from the medication list:    Carvedilol 6.25 Mg Tabs (Carvedilol) .Marland Kitchen... Take 1 tablet by mouth two times a day His updated medication list for this problem includes:    Amlodipine Besylate 10 Mg Tabs (Amlodipine besylate) .Marland Kitchen... Take 1 tablet by mouth once a day    Aspir-low 81 Mg Tbec (Aspirin) .Marland Kitchen... Take 1 tablet by mouth once a day    Diovan 80 Mg Tabs (Valsartan) .Marland Kitchen... Take 1 tablet by mouth two times a day    Torsemide 20 Mg Tabs (Torsemide) .Marland Kitchen... Take 1 tablet by mouth once a day    Spironolactone 25 Mg Tabs (Spironolactone) .Marland KitchenMarland KitchenMarland KitchenMarland Kitchen  Take 1 tablet by mouth once a day    Metoprolol Succinate 50 Mg Xr24h-tab (Metoprolol succinate) .Marland Kitchen... Take 1 tablet by mouth two times a day  Patient Instructions: 1)  Increase Metoprolol ER to 50mg  two times a day  2)  Citalopram 20mg  daily 3)  Increase Clonazepam to 1mg  to three times a day  4)  Follow up in  6 wks Prescriptions: CLONAZEPAM 1 MG TABS (CLONAZEPAM) take one tab three times a day  #90 x 2   Entered by:   Hoover Brunette, LPN   Authorized by:   Lewayne Bunting, MD, [facility]   Signed by:   Hoover Brunette, LPN on 16/09/9603   Method used:   Print then Give  to Patient   RxID:   [id number] CITALOPRAM HYDROBROMIDE 20 MG TABS (CITALOPRAM HYDROBROMIDE) Take 1 tablet by mouth once a day  #30 x 2   Entered by:   Hoover Brunette, LPN   Authorized by:   Lewayne Bunting, MD, [facility]   Signed by:   Hoover Brunette, LPN on 21/30/8657   Method used:   [facility]  [address] [facility]       [address]       [place], [place]  [address]       Ph: [phone] or [phone]       Fax: [phone]   RxID:   [id number] METOPROLOL SUCCINATE 50 MG XR24H-TAB (METOPROLOL SUCCINATE) Take 1 tablet by mouth two times a day  #60 x 6   Entered by:   Hoover Brunette, LPN   Authorized by:   Lewayne Bunting, MD, [facility]   Signed by:   Hoover Brunette, LPN on 84/16/6063   Method used:   [facility]  [address] [facility]       [address]       [place], [place]  [address]       Ph: [phone] or [phone]       Fax: [phone]   RxID:   [id number]

## 2010-12-31 NOTE — Consult Note (Signed)
Summary: Consultation Report/ CARDIOLOGY  Consultation Report/ CARDIOLOGY   Imported By: Dorise Hiss 09/16/2010 16:20:44  _____________________________________________________________________  External Attachment:    Type:   Image     Comment:   External Document

## 2010-12-31 NOTE — Miscellaneous (Signed)
Summary: Rehab Report/ FAXED [facility] REFERRAL  Rehab Report/ FAXED REHAB REFERRAL   Imported By: Dorise Hiss 08/13/2010 10:37:39  _____________________________________________________________________  External Attachment:    Type:   Image     Comment:   External Document

## 2010-12-31 NOTE — Progress Notes (Signed)
Summary: UNABLE TO HOLD BED PAST 4:00PM  Phone Note From Other Clinic Call back at [phone]   Caller: Shawn Call For: NURSE Summary of Call: called to inform office that patient still not there for admission. nurse call and left messgage on voicemail and also spoke with mother and she stated that patient was at hopsital at this time for admission. Shawn informed. Carlye Grippe  June 27, 2010 3:24 PM  Initial call taken by: Carlye Grippe,  June 27, 2010 3:09 PM

## 2010-12-31 NOTE — Progress Notes (Signed)
Summary: Pt did not go to [facility] for admission  Phone Note Other Incoming   Summary of Call: Call from Trish at [facility] that pt was not admitted to [facility] last evening as planned. Called pt but no answer. Called bed control. They will try to find out if pt was called for bed placement or what the situation is and contact our office back.   Initial call taken by: Cyril Loosen, RN, BSN,  June 27, 2010 9:49 AM  Follow-up for Phone Call        Shawn with Bed Control at [facility] called back today stating they attempted to reach pt yesterday evening but did not get an answer.  Spoke with pt who states he never received a call from [facility]. Will contact pt once bed is available today.  Follow-up by: Cyril Loosen, RN, BSN,  June 27, 2010 10:06 AM  Additional Follow-up for Phone Call Additional follow up Details #1::        Spoke with Shawn at bed control. Pt has bed available now. Pt notified. He is aware to go to [facility] now for admission. Trish notified. Additional Follow-up by: Cyril Loosen, RN, BSN,  June 27, 2010 10:17 AM

## 2010-12-31 NOTE — Progress Notes (Signed)
Summary: clarify meds  Phone Note Call from Patient   Summary of Call: Saw Dr. Andee Lineman on Friday while in hosp at [facility].  Went home on Saturday.   Said that GD was going to give him two new meds, but did not get scripts.  Dr. Olena Leatherwood d/c on Saturday.  Cardiology consult note states change Coreg to Metoprolol 50mg  two times a day, but was given rx for once daily.  Amlodipine 10mg  once daily, Digoxin - was not given, Citalopram - not given.  Clonazepam - no script.   Please review med list and advise what he needs to be on.  When do you want to see in office again?  ([facility]   Initial call taken by: Hoover Brunette, LPN,  September 16, 2010 4:12 PM  Follow-up for Phone Call        This is infuriating - he need to be on all meds from my consult note. Have him come tomorrow in the AM for me to see for 5 min and get clear on everything.  Follow-up by: Lewayne Bunting, MD, [facility],  September 17, 2010 1:23 PM  Additional Follow-up for Phone Call Additional follow up Details #1::        Patient notified.   Scheduled OV for Wednesday, 10/19 at 10:00.  Advised him to bring all meds and discharge papers given from last hosp. visit.    Additional Follow-up by: Hoover Brunette, LPN,  September 17, 2010 2:33 PM

## 2010-12-31 NOTE — Letter (Signed)
Summary: Discharge Summary  Discharge Summary   Imported By: Dorise Hiss 06/12/2010 09:19:51  _____________________________________________________________________  External Attachment:    Type:   Image     Comment:   External Document

## 2010-12-31 NOTE — Letter (Signed)
Summary: Discharge Summary/ [facility]  Discharge Summary/ [facility]   Imported By: Dorise Hiss 09/16/2010 16:22:01  _____________________________________________________________________  External Attachment:    Type:   Image     Comment:   External Document

## 2011-01-02 NOTE — Progress Notes (Signed)
Summary: cramping in legs  Phone Note Call from Patient   Caller: VOICEMAIL MESSAGE Summary of Call: Complain of having cramps in thighs and legs.  Feels like a charlie horse cramp.  Also, in abdomen last night.  Wonders if he needs to go back on his potassium .  Looks like it was stopped in August.   Hoover Brunette, LPN  November 13, 2010 12:17 PM   Follow-up for Phone Call        Check BMET ASAP, then will decide.  Follow-up by: Lewayne Bunting, MD, [facility],  November 19, 2010 5:07 AM  Additional Follow-up for Phone Call Additional follow up Details #1::        Patient notified.   Will go today.   Hoover Brunette, LPN  November 19, 2010 10:13 AM

## 2011-02-14 LAB — BASIC METABOLIC PANEL
BUN: 12 mg/dL (ref 6–23)
BUN: 14 mg/dL (ref 6–23)
Chloride: 104 mEq/L (ref 96–112)
Chloride: 107 mEq/L (ref 96–112)
Creatinine, Ser: 1.12 mg/dL (ref 0.4–1.5)
GFR calc Af Amer: >60 mL/min (ref >60)
GFR calc Af Amer: >60 mL/min (ref >60)
Glucose, Bld: 131 mg/dL — ABNORMAL HIGH (ref 70–99)
Potassium: 3.7 mEq/L (ref 3.5–5.1)
Sodium: 140 mEq/L (ref 135–145)

## 2011-02-14 LAB — CBC
MCH: 29.3 pg (ref 26.0–34.0)
MCHC: 34.3 g/dL (ref 30.0–36.0)
RBC: 5.08 MIL/uL (ref 4.22–5.81)
RDW: 14.8 % (ref 11.5–15.5)
WBC: 7.9 10*3/uL (ref 4.0–10.5)

## 2011-02-15 LAB — BASIC METABOLIC PANEL
BUN: 13 mg/dL (ref 6–23)
CO2: 27 mEq/L (ref 19–32)
Calcium: 8.6 mg/dL (ref 8.4–10.5)
Chloride: 102 mEq/L (ref 96–112)
Chloride: 103 mEq/L (ref 96–112)
Chloride: 105 mEq/L (ref 96–112)
GFR calc Af Amer: >60 mL/min (ref >60)
GFR calc Af Amer: >60 mL/min (ref >60)
GFR calc non Af Amer: >60 mL/min (ref >60)
GFR calc non Af Amer: >60 mL/min (ref >60)
Glucose, Bld: 123 mg/dL — ABNORMAL HIGH (ref 70–99)
Glucose, Bld: 90 mg/dL (ref 70–99)
Glucose, Bld: 93 mg/dL (ref 70–99)
Potassium: 3.4 mEq/L — ABNORMAL LOW (ref 3.5–5.1)
Potassium: 3.7 mEq/L (ref 3.5–5.1)
Potassium: 3.7 mEq/L (ref 3.5–5.1)
Sodium: 139 mEq/L (ref 135–145)
Sodium: 139 mEq/L (ref 135–145)
Sodium: 140 mEq/L (ref 135–145)
Sodium: 144 mEq/L (ref 135–145)

## 2011-02-15 LAB — CARDIAC PANEL(CRET KIN+CKTOT+MB+TROPI)
CK, MB: 1.2 ng/mL (ref 0.3–4.0)
CK, MB: 1.5 ng/mL (ref 0.3–4.0)
Relative Index: 0.7 (ref 0.0–2.5)
Relative Index: 0.8 (ref 0.0–2.5)
Troponin I: 0.07 ng/mL — ABNORMAL HIGH (ref 0.00–0.06)

## 2011-02-15 LAB — CBC
HCT: 44 % (ref 39.0–52.0)
HCT: 44.3 % (ref 39.0–52.0)
Hemoglobin: 14.7 g/dL (ref 13.0–17.0)
MCH: 28.9 pg (ref 26.0–34.0)
MCHC: 33.7 g/dL (ref 30.0–36.0)
MCV: 85.5 fL (ref 78.0–100.0)
MCV: 85.8 fL (ref 78.0–100.0)
Platelets: 209 10*3/uL (ref 150–400)
RBC: 5.12 MIL/uL (ref 4.22–5.81)
RBC: 5.19 MIL/uL (ref 4.22–5.81)
WBC: 8.4 10*3/uL (ref 4.0–10.5)

## 2011-02-15 LAB — COMPREHENSIVE METABOLIC PANEL
ALT: 21 U/L (ref 0–53)
CO2: 25 mEq/L (ref 19–32)
Chloride: 102 mEq/L (ref 96–112)
Creatinine, Ser: 1.16 mg/dL (ref 0.4–1.5)
GFR calc Af Amer: >60 mL/min (ref >60)
Potassium: 3.2 mEq/L — ABNORMAL LOW (ref 3.5–5.1)
Total Protein: 7 g/dL (ref 6.0–8.3)

## 2011-02-15 LAB — BRAIN NATRIURETIC PEPTIDE: Pro B Natriuretic peptide (BNP): 118 pg/mL — ABNORMAL HIGH (ref 0.0–100.0)

## 2011-02-15 LAB — GLUCOSE, CAPILLARY: Glucose-Capillary: 94 mg/dL (ref 70–99)

## 2011-02-15 LAB — PROTIME-INR: INR: 1.03 (ref 0.00–1.49)

## 2011-02-18 LAB — DIFFERENTIAL
Basophils Absolute: 0 10*3/uL (ref 0.0–0.1)
Basophils Relative: 1 % (ref 0–1)
Eosinophils Relative: 2 % (ref 0–5)
Lymphocytes Relative: 28 % (ref 12–46)
Lymphocytes Relative: 42 % (ref 12–46)
Lymphocytes Relative: 48 % — ABNORMAL HIGH (ref 12–46)
Lymphs Abs: 4 10*3/uL (ref 0.7–4.0)
Lymphs Abs: 4.6 10*3/uL — ABNORMAL HIGH (ref 0.7–4.0)
Monocytes Absolute: 0.7 10*3/uL (ref 0.1–1.0)
Monocytes Absolute: 0.8 10*3/uL (ref 0.1–1.0)
Monocytes Absolute: 0.8 10*3/uL (ref 0.1–1.0)
Monocytes Relative: 6 % (ref 3–12)
Monocytes Relative: 7 % (ref 3–12)
Monocytes Relative: 8 % (ref 3–12)
Neutro Abs: 4.3 10*3/uL (ref 1.7–7.7)
Neutro Abs: 7.8 10*3/uL — ABNORMAL HIGH (ref 1.7–7.7)
Neutrophils Relative %: 41 % — ABNORMAL LOW (ref 43–77)

## 2011-02-18 LAB — COMPREHENSIVE METABOLIC PANEL
Albumin: 3.7 g/dL (ref 3.5–5.2)
Alkaline Phosphatase: 81 U/L (ref 39–117)
BUN: 17 mg/dL (ref 6–23)
Calcium: 8.4 mg/dL (ref 8.4–10.5)
Potassium: 3.3 mEq/L — ABNORMAL LOW (ref 3.5–5.1)
Sodium: 135 mEq/L (ref 135–145)
Total Protein: 7.2 g/dL (ref 6.0–8.3)

## 2011-02-18 LAB — POCT I-STAT, CHEM 8
BUN: 19 mg/dL (ref 6–23)
Calcium, Ion: 0.98 mmol/L — ABNORMAL LOW (ref 1.12–1.32)
Chloride: 110 mEq/L (ref 96–112)
Glucose, Bld: 90 mg/dL (ref 70–99)
TCO2: 22 mmol/L (ref 0–100)

## 2011-02-18 LAB — URINE DRUGS OF ABUSE SCREEN W ALC, ROUTINE (REF LAB)
Barbiturate Quant, Ur: NEGATIVE
Cocaine Metabolites: NEGATIVE
Methadone: NEGATIVE
Phencyclidine (PCP): NEGATIVE
Propoxyphene: NEGATIVE

## 2011-02-18 LAB — URINE CULTURE: Culture: NO GROWTH

## 2011-02-18 LAB — BASIC METABOLIC PANEL
BUN: 13 mg/dL (ref 6–23)
CO2: 25 mEq/L (ref 19–32)
Chloride: 101 mEq/L (ref 96–112)
GFR calc Af Amer: >60 mL/min (ref >60)
GFR calc non Af Amer: >60 mL/min (ref >60)
Glucose, Bld: 100 mg/dL — ABNORMAL HIGH (ref 70–99)
Potassium: 3.6 mEq/L (ref 3.5–5.1)
Potassium: 3.7 mEq/L (ref 3.5–5.1)
Sodium: 138 mEq/L (ref 135–145)
Sodium: 139 mEq/L (ref 135–145)

## 2011-02-18 LAB — CBC
HCT: 43.1 % (ref 39.0–52.0)
HCT: 45.3 % (ref 39.0–52.0)
HCT: 47.6 % (ref 39.0–52.0)
Hemoglobin: 14.9 g/dL (ref 13.0–17.0)
Hemoglobin: 15.4 g/dL (ref 13.0–17.0)
Hemoglobin: 15.5 g/dL (ref 13.0–17.0)
MCHC: 34 g/dL (ref 30.0–36.0)
MCHC: 34.5 g/dL (ref 30.0–36.0)
MCV: 85.4 fL (ref 78.0–100.0)
MCV: 86.4 fL (ref 78.0–100.0)
Platelets: 210 10*3/uL (ref 150–400)
Platelets: 235 10*3/uL (ref 150–400)
RBC: 5.04 MIL/uL (ref 4.22–5.81)
RBC: 5.31 MIL/uL (ref 4.22–5.81)
RBC: 5.31 MIL/uL (ref 4.22–5.81)
RDW: 14.8 % (ref 11.5–15.5)
RDW: 15 % (ref 11.5–15.5)
RDW: 15.6 % — ABNORMAL HIGH (ref 11.5–15.5)
WBC: 7.7 10*3/uL (ref 4.0–10.5)
WBC: 9.6 10*3/uL (ref 4.0–10.5)

## 2011-02-18 LAB — LIPID PANEL
Triglycerides: 196 mg/dL — ABNORMAL HIGH (ref <150)
VLDL: 39 mg/dL (ref 0–40)

## 2011-02-18 LAB — PHOSPHORUS: Phosphorus: 3.1 mg/dL (ref 2.3–4.6)

## 2011-02-18 LAB — PROTIME-INR
INR: 1.01 (ref 0.00–1.49)
Prothrombin Time: 13.2 seconds (ref 11.6–15.2)

## 2011-02-18 LAB — TROPONIN I
Troponin I: 0.1 ng/mL — ABNORMAL HIGH (ref 0.00–0.06)
Troponin I: 0.1 ng/mL — ABNORMAL HIGH (ref 0.00–0.06)
Troponin I: 0.1 ng/mL — ABNORMAL HIGH (ref 0.00–0.06)
Troponin I: 0.11 ng/mL — ABNORMAL HIGH (ref 0.00–0.06)

## 2011-02-18 LAB — POCT CARDIAC MARKERS
CKMB, poc: <1 ng/mL — ABNORMAL LOW (ref 1.0–8.0)
Troponin i, poc: <0.05 ng/mL (ref 0.00–0.09)

## 2011-02-18 LAB — URINALYSIS, ROUTINE W REFLEX MICROSCOPIC
Ketones, ur: NEGATIVE mg/dL
Nitrite: NEGATIVE
Specific Gravity, Urine: 1.019 (ref 1.005–1.030)
Urobilinogen, UA: 0.2 mg/dL (ref 0.0–1.0)
pH: 5 (ref 5.0–8.0)

## 2011-02-18 LAB — BRAIN NATRIURETIC PEPTIDE: Pro B Natriuretic peptide (BNP): 308 pg/mL — ABNORMAL HIGH (ref 0.0–100.0)

## 2011-02-18 LAB — APTT: aPTT: 24 seconds (ref 24–37)

## 2011-02-18 LAB — CK TOTAL AND CKMB (NOT AT ARMC)
CK, MB: 1.2 ng/mL (ref 0.3–4.0)
Relative Index: 1 (ref 0.0–2.5)
Relative Index: 1 (ref 0.0–2.5)
Relative Index: 1.4 (ref 0.0–2.5)
Total CK: 100 U/L (ref 7–232)
Total CK: 116 U/L (ref 7–232)
Total CK: 122 U/L (ref 7–232)

## 2011-02-20 ENCOUNTER — Telehealth: Payer: Self-pay | Admitting: *Deleted

## 2011-02-20 NOTE — Telephone Encounter (Signed)
Patient wanting to know if okay to take the following to help lose weight:  Herbal Life Formula Healthy Meal Herbal Aloe Concentrate Herbal Tea Thermo Bond

## 2011-02-28 ENCOUNTER — Telehealth: Payer: Self-pay | Admitting: *Deleted

## 2011-02-28 DIAGNOSIS — F419 Anxiety disorder, unspecified: Secondary | ICD-10-CM

## 2011-02-28 NOTE — Telephone Encounter (Signed)
[facility] on Clonazepam 1mg  - take one tablet three times a day.

## 2011-03-04 MED ORDER — CLONAZEPAM 1 MG PO TABS: ORAL_TABLET | ORAL | Status: DC

## 2011-03-04 NOTE — Telephone Encounter (Signed)
Patient notified of okay on Clonazepam.  6 month f/u scheduled.

## 2011-03-04 NOTE — Telephone Encounter (Signed)
Okay to fill Clonazepam per Dr. Andee Lineman - 1 month + 1 refill.  Spoke with Rocky Link at [facility] - give verbal rx.

## 2011-03-11 NOTE — Telephone Encounter (Signed)
Yes should be ok

## 2011-03-15 NOTE — Telephone Encounter (Signed)
Letter mailed notifying pt okay per GD.  Number on file not in service.

## 2011-04-15 NOTE — Assessment & Plan Note (Signed)
[facility]                                 ON-CALL NOTE   NAME:[name], [name]                       MRN:          [id number]  DATE:09/26/2007                            DOB:          31-Aug-1966    CARDIOLOGIST:  Dr. Lewayne Bunting   TELEPHONE NUMBER:  [phone]   HISTORY:  [name] is a 45 year old male with non-ischemic  cardiomyopathy with an EF in 30% range by previous notes that has a  CardioNet event monitor on at this time. The monitoring tech called the  answering service because there was a 13 beat run of non-sustained  ventricular tachycardia. According to the monitoring tech, the patient  was asymptomatic. He did return to normal sinus rhythm. I tried to  contact the patient at home, but he is not available.   PLAN:  I explained to his wife that if he is having any problems, he is  to call our service and have me paged so that I can speak with him.  Otherwise, we will review his chart tomorrow in the office and decide on  appropriate follow up. I suspect that we will probably see him in the  next few days in the office.   DISPOSITION:  As noted above.      Tereso Newcomer, PA-C  Electronically Signed      Rollene Rotunda, MD, [facility]  Electronically Signed   SW/MedQ  DD: 09/26/2007  DT: 09/27/2007  Job #: [id number]

## 2011-04-15 NOTE — Assessment & Plan Note (Signed)
[facility]                          [facility] NOTE   NAME:Drzewiecki, GWENDOLYN NISHI                       MRN:          [id number]  DATE:09/28/2007                            DOB:          [birth year]    REFERRING PHYSICIAN:  Lia Hopping   HISTORY OF PRESENT ILLNESS:  The patient is a 45 year old male with a  history of nonischemic cardiomyopathy.  The patient underwent a  catheterization earlier this year and had normal coronary arteries.  He  had severe LV dysfunction, with an ejection fraction of 30%.  He also  had elevated PA pressures only in the moderate range.  Pulmonary wedge  pressure was 90 mmHg.  The patient's prior echocardiographic studies did  demonstrate morphological evidence for hypertrophic cardiomyopathy, with  at one point septal thickness of 3 cm.  This was recorded in September  2006.  The patient has been actively participating in his cardiac care.  He actually goes to cardiac rehab, although he states lately he has not  been able to go due to worsening fatigue and weakness.  He states that  he is quite depressed and is under significant social stress.  He was  recently hospitalized in June 2008 with substernal chest pain.  The  patient ruled out for myocardial infarction.  In retrospect, this seems  more GI in nature.  His BNP is also low at 18.  The patient states that  he feels drained most of the time.  He also feels that he urinates quite  a bit, but at the same time he also drinks quite a bit due to his  consistent sensation of dryness.  However, the patient did not follow my  recommendations from March 2008 when I told him to cut back on his  torsemide to 10 mg a day.  He has continued to take 20 mg a day.  The  patient also reports frequent dizziness, particularly on  exertion.  Sometimes, he states that he talks to his mother on the phone night, and  then he passes out, although it appears that he just falls asleep  at  that time.  He then also forgets to use his BiPAP on a regular basis.  Due to complaints of tachypalpitations in June 2008, the patient had a  CardioNet monitor applied.  This demonstrated frequent episodes of  nonsustained ventricular tachycardia.  He remains complaint with his  medical regimen, although he still drinks occasional alcohol and reports  some ongoing tobacco use.   MEDICATIONS:  1. Aspirin 81 mg a day.  2. Norvasc 10 mg a day.  3. Coreg 12.5 mg p.o. daily.  4. Torsemide 20 mg p.o. daily.  5. Protonix 40 mg a day.  6. Diovan/hydrochlorothiazide 80/12.5 daily.  7. Potassium supplement 20 mEq p.o. daily.   PHYSICAL EXAMINATION:  VITAL SIGNS:  Blood pressure 137/84, heart rate  76 beats per minute, weight not obtained, pulse oximetry 98%.  GENERAL:  Markedly obese African American male in no distress.  HEENT:  Pupils isocoric.  Conjunctiva clear.  NECK:  Supple.  Normal carotid  upstroke.  No carotid bruits.  LUNGS:  Clear breath sounds bilaterally.  HEART:  Regular rate and rhythm.  Normal S1, S2.  No evidence of  pathological outflow murmur.  ABDOMEN:  Soft, nontender. No rebound or guarding.  Good bowel sounds.  EXTREMITIES:  No cyanosis, clubbing, or edema.  NEUROLOGIC:  The patient is alert, oriented, and grossly nonfocal.   PROBLEM LIST:  1. Dyspnea.      a.     Nonischemic cardiomyopathy.      b.     History of alcohol use.      c.     Component of hypertrophic cardiomyopathy, with increased       septal thickness.  Rule out hypertrophic cardiomyopathy, but       without resting outflow gradient.      d.     Left ventricular dysfunction.  Ejection fraction 30%.  2. New York Heart Association class II-B/III.  3. Palpitations, with documented nonsustained ventricular tachycardia.  4. Alcohol use.  5. History of noncompliance.  6. Atypical chest pain, but normal coronary arteries.  7. Morbid obesity and marked deconditioning.  8. Obstructive sleep apnea,  on bilevel positive airway pressure, but      noncompliant.  9. Hypertension, controlled.   PLAN:  1. It is very hard to piece out the patient's symptoms.  He clearly is      depressed, has ongoing fatigue, and  feels he has no energy.  I      told him part of the problem is that he is dehydrated.  His BNP was      low during his recent admission, and I have asked him to cut his      torsemide to 10 mg p.o. daily, which I had already recommended in      March 2008.  2. I will also ask the patient to increase potassium to 20 mg p.o.      b.i.d.  3. Will report an echocardiographic study to reassess his ejection      fraction and wall thickness.  This is also in anticipation of the      patient seeing Dr. Graciela Husbands to evaluate the nonsustained ventricular      tachycardia in the setting of his nonischemic cardiomyopathy      (albeit with a component of significant hypertrophy).  I am not      sure that the patient really has an indication for a defibrillator      implant at this point in time, but will ask Dr. Graciela Husbands to provide [place]      with a second opinion.  4. The patient also is noncompliant with his CPAP but wants a second      opinion and help with management of his device, and I have referred      him to Dr. Benson Setting.  5. The patient will follow up with me in 3 months, and we will arrange      an appointment with Dr. Graciela Husbands in the interim.     Learta Codding, MD,FACC  Electronically Signed    GED/MedQ  DD: 09/28/2007  DT: 09/29/2007  Job #: [id number]   cc:   Lia Hopping

## 2011-04-18 NOTE — Assessment & Plan Note (Signed)
[facility]                          [facility] NOTE   NAME:[name], [name]                       MRN:          [id number]  DATE:02/03/2007                            DOB:          11-19-1966    REFERRING PHYSICIAN:  [facility]   HISTORY OF PRESENT ILLNESS:  The patient is a 45 year old black male  with a history of presumed nonischemic cardiomyopathy. The patient was  last seen in this office on September 29, 2005. He also had a  hospitalization at [facility] in September 2006. He presented  with significant dyspnea and he was a NYHA Class IV. The patient was  felt to have a nonischemic cardiomyopathy with an ejection fraction of  30 to 40%. It was not clear whether this was secondary to history of  alcohol use versus hypertensive heart disease. An echocardiogram,  however, done at that time, demonstrated the patient had a hypertrophic  cardiomyopathy with a septal thickness of 3 cm. He had mildly severe to  LV systolic dysfunction with an ejection fraction of 30%. This was  difficult to determine, however, even with Definity contrast. He had  mild mitral regurgitation and had evidence of significant elevation left  atrial pressure as evidenced by bowing of the atrial septum from left to  right. The patient, at that time, was diuresed and he was started on an  appropriate medical regimen for heart failure.  The patient does,  however, have a history of noncompliance. He did follow up a month later  in our office, on September 29, 2005. At that time, he was an NYHA Class  II to III. He complained of ongoing dyspnea but his blood pressure was  under better control. He also had ongoing tobacco and alcohol use.  Further optimization in medical therapy was attempted. The patient,  however, did not follow up for several months. He than was seen again on  February 03, 2007. The patient is now seen again today in the office. He  still complains of significant dyspnea on minimal exertion. The patient  also has chest pain on exertion. He feels that he has tightening of his  back upon exertion. He does report some pain radiating to the left arm.  He also reports chest pain when he gets agitated.  The patient states  that he is frustrated with his level of dyspnea and also with his  substernal chest pain. He states I want to get better. He also  complains of generalized weakness. He states that he has lots of gas  which makes him very uncomfortable.  The patient has a history of sleep  apnea and has been using is CPAP. His EKG today in the office  demonstrates normal sinus rhythm with significant left ventricular  hypertrophy and widened QRS.   MEDICATIONS:  Norvasc 10 mg p.o. daily, aspirin 81 mg daily, Coreg 12.5  b.i.d., K-Dur 20 mg p.o. b.i.d., torsemide 20 mg p.o. daily, Protonix 40  mg a day, Diovan hydrochlorothiazide 80/12.5 daily, and BiPAP nocturnal  treatment.   PHYSICAL EXAMINATION:  VITAL SIGNS:  Blood  pressure 157/95, heart rate  is 75 beats per minute, weight is 435 pounds.  IN GENERAL:  Obese African American male but in no apparent distress.  HEENT:  Pupils isochoric. Conjunctivae clear.  NECK:  Supple. Normal carotid upstroke and no carotid bruits.  LUNGS:  Clear, breath sounds bilaterally.  HEART:  Regular rate and rhythm with S3 and no pathological murmurs.  ABDOMEN:  Soft, nontender, no rebound or guarding, good bowel sounds.  EXTREMITY EXAM:  No clubbing, cyanosis. There is 1+ peripheral pitting  edema.   A 12-lead electrocardiogram is outlined above.   Creatinine 1.3, glucose 165, BUN is 90, potassium is 3.2, anion gap is  9.2, white count is 8.9, hemoglobin is 15, platelet count is 277.   A chest x-ray demonstrates no acute process with cardiomegaly.   PROBLEM LIST:  1. Dyspnea.      a.     Presumed nonischemic cardiomyopathy.      b.     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      c.     Hypertensive heart disease with marked increase in septal       thickness.      d.     LV dysfunction. Ejection fraction at 30%.      e.     NYHA Class III.  2. History of ventricular ectopy.  3. Substernal chest pain, rule out ischemic heart disease.  4. History of tobacco use.  5. History of sleep apnea, rule out pulmonary hypertension.  6. ACE INHIBITOR ALLERGY.  7. Obesity.  8. Psychiatric disorder, depression versus schizoaffective disorder.  9. Gastroesophageal reflux disease.  10.Severe noncompliance.  11.Left leg pain. Rule out neuropathy versus claudication (latter      unlikely).   PLAN:  1. The patient has never undergone cardiac catheterization before. He      continued to be rather dyspneic and now he is also complaining of      chest pain, the latter albeit atypical.  2. I have recommended the patient to proceed with a left and right      heart catheterization to define his coronary anatomy and also to      rule out pulmonary hypertension in and assess his intracardiac      pressures.  3. The patient has agreed and we will proceed with a JV      catheterization next week.  4. In the interim I have also increased the patient's Coreg as well as      upped his torsemide for further diuresis.  5. The blood pressure is poorly controlled and I have made above      measure as outlined above.  6. I counseled the patient regarding his tobacco use again.     Learta Codding, MD,FACC  Electronically Signed    GED/MedQ  DD: 02/09/2007  DT: 02/09/2007  Job #: [id number]

## 2011-04-18 NOTE — Discharge Summary (Signed)
[name], [name]                          ACCOUNT NO.:  [id number]   MEDICAL RECORD NO.:  [id number]                   PATIENT TYPE:  NP   LOCATION:  A214                                 FACILITY:  [facility]   PHYSICIAN:  Dirk Dress. Katrinka Blazing, M.D.                DATE OF BIRTH:  03-Jun-1966   DATE OF ADMISSION:  05/29/2002  DATE OF DISCHARGE:  05/30/2002                                 DISCHARGE SUMMARY   DISCHARGE DIAGNOSES:  1. Atypical chest pain probably musculoskeletal.  2. History of congestive heart failure.  3. Nonischemic cardiomyopathy.  4. Morbid obesity.  5. Hypertension.  6. History of sleep apnea.   DISPOSITION:  The patient is discharged home and will have a followup in  cardiac rehab.   DISCHARGE MEDICATIONS:  1. Advair one or two every 4 hours as needed.  2. Coreg 6.25 mg t.i.d.  3. Maxzide 25 q.d.  4. Demadex 20 mg q.d.  5. Norvasc 10 mg q.d.  6. Aspirin 81 mg q.d.  7. Docusate 30 mg q.d.   FOLLOW UP:  The patient will have follow up with Dr. Daleen Squibb and Dr. Lodema Hong on  July 10 and June 13, 2002, respectively.   HISTORY OF PRESENT ILLNESS:  The patient is a 45 year old male with a  history of acute onset of left-sided chest pain while at church.  He states  that the pain was in his left upper chest and subclavicular area radiating  through to his neck and down his left arm.  He had numbness in his arm.  There is a questionable history of altered mental status and loss of memory  though he did not have a syncopal episode.  He was taken to the emergency  room at [facility] where he was found to be in no acute distress.  Cardiac enzymes were negative for myocardial injury.  Chest x-ray showed  cardiomegaly with interstitial changes suggestive of early interstitial  edema.  The patient was treated and his symptoms improved but he still  complained of chest pain.  He was transferred to this hospital because of  need for continued care with his primary care  physician.  He was admitted  and plans were made for followup treatment.   PAST MEDICAL HISTORY:  History is significant for nonischemic cardiomyopathy  with ejection fraction of 35%.   SOCIAL HISTORY:  He is disabled because of massive obesity and his  cardiomyopathy.  He has a history of alcohol abuse at an early age.   PHYSICAL EXAMINATION:  GENERAL: He is in no acute distress.  VITAL SIGNS: Blood pressure 130/70, pulse 70, respirations 22, temperature  98, oxygen saturation 98%.  CHEST: Moderate tenderness of the left anterior chest over the pectoralis  major extending into the subclavicular area out to the area of  acromioclavicular joint.  LUNGS: Bibasilar rales but good movement.  EXTREMITIES: 2+ edema of the  lower extremities.   HOSPITAL COURSE:  It was felt that he had atypical musculoskeletal pain.  He  was admitted.  He had sleep apnea so he was continued on CPAP machine.  Followup cardiac enzymes were done and were negative.  He was started on  Indocin 50 mg three times a day and he was given Pepcid for ulcer  prophylaxis.  He was seen in consultation by Dr. Daleen Squibb who felt that his pain  actually was musculoskeletal in origin.  With his history of no previous  documentation of coronary artery disease, Dr. Daleen Squibb felt that Cardiolite  study was not indicated.  The patient was counseled and plans were made for  followup.  He remained stable overnight and the following afternoon.  He was  discharged home after he was evaluated by Dr. Daleen Squibb in satisfactory  condition.                                               Dirk Dress. Katrinka Blazing, M.D.    LCS/MEDQ  D:  07/09/2002  T:  07/14/2002  Job:  [id number]

## 2011-04-18 NOTE — H&P (Signed)
NAMEDESMAN, POLAK NO.:  [id number]   MEDICAL RECORD NO.:  [id number]          PATIENT TYPE:  EMS   LOCATION:  ED                            FACILITY:  [facility]   PHYSICIAN:  Vania Rea, M.D. DATE OF BIRTH:  03/06/1966   DATE OF ADMISSION:  03/13/2005  DATE OF DISCHARGE:  LH                                HISTORY & PHYSICAL   PRIMARY CARE PHYSICIAN:  Milus Mallick. Lodema Hong, M.D.  Cardiologist, Dr. Dani Gobble.   CHIEF COMPLAINT:  Chest tightness for the past 2 weeks.  Chest pains for the  past few months.  Feeling sick chronically.   HISTORY OF PRESENT ILLNESS:  This is a 45 year old, morbidly obese, African-  American man with a history of nonischemic cardiomyopathy, hypertension,  sleep apnea and GERD who for the past few months has been noticing a  progression of his respiratory symptoms from his baseline orthopnea,  increasing PND and increasing swelling of the lower extremities.  The  patient is on multiple medications, but is mostly noncompliant because of  financial issues and also because he says his Demodex, in particular, causes  muscle cramping and interference with his sex life.  The patient gives a  history of chest pain with getting gradually worse over the past 1-2 weeks  and now severe tightness in his chest for the past 1-2 days.  He feels the  pain is aggravated by increasing amounts of green leafy vegetables which he  is eating in his diet in order to lose weight.  The patient drinks alcohol  episodically and admits to drinking about 1/2 gallon of spirits over the  weekend.  He says because of feeling down about his medical problems and his  failure to lose weight is the cause.  The patient says he feels the world is  caving in on him.  He often feels that he wants to cry and often finds  himself crying spontaneously.  He was referred to a psychiatrist about 1-2  years ago, was treated for depression, but could only afford one visit  because of financial issues.  He says he is willing to be seen at the  [facility].   The patient describes his pain as being moving around his chest and abdomen  associated with shortness of breath, nausea and feeling as if he is going to  faint.   PAST MEDICAL HISTORY:  1.  Morbid obesity.  2.  Nonischemic cardiomyopathy.  3.  Hypertension.  4.  Sleep apnea.  5.  GERD.  6.  Depression.  7.  Hyperlipidemia.   MEDICATIONS:  1.  Maxzide 50 mg daily.  2.  Coreg 6.25 mg twice daily.  3.  K-Dur 20 mEq daily.  4.  Prevacid p.r.n.  5.  Reglan p.r.n.  6.  Demodex 1/4 to 1/2 of a 20 mg tablet daily.   The patient is very noncompliant with these medications partly because he  says the Demodex causes cramping and partly because he says he cannot afford  these medications since he lost Medicaid.   ALLERGIES:  ACE INHIBITORS cause angioedema and swelling of his throat.   SOCIAL HISTORY:  Smokes one pack a day of tobacco.  Binge drinks spirits on  occasion.  Last use the past weekend.  Denies illicit drug use.  He is  presently on Disability.   FAMILY HISTORY:  Both parents are living.  Mother with hypertension.  Father  with stroke and diabetes.  He has four sisters and eight brothers.  He has  twin brother and sister with diabetes and a younger brother with mental  health problems.  He has two teenage children who are in good healthy.   REVIEW OF SYSTEMS:  On a 10-point review of systems, other than noted in the  admission History and Physical, is positive only for headaches.   PHYSICAL EXAMINATION:  GENERAL:  Morbidly obese, African-American man lying  in bed, very talkative and rambling.  He displays signs of anxiety.  He does  not really want to be admitted.  VITAL SIGNS:  Temperature 97.9, pulse 84, blood pressure 151/90,  respirations 16.  Pain level is 6/10.  HEENT:  Pupils equal round and reactive.  Mucous membranes are moist.  CHEST:  Clear to auscultation  bilaterally.  CARDIAC:  Regular rate and rhythm.  ABDOMEN:  Obese, soft, nontender.  EXTREMITIES:  He has 1+ edema bilaterally.  NEUROLOGIC:  He is alert and oriented x3.  Has no focal neurological  deficits.   LABORATORY DATA AND X-RAY FINDINGS:  CBC is completely unremarkable with a  white count of 10.2, hemoglobin 15.4 and normal differential, platelets 286.  Serum chemistry is likewise remarkable only for a potassium of 3.5, BUN 14,  creatinine 1.1, glucose 92, calcium 8.7.  He has had two sets of points of  care enzymes which are completely normal.  His D-dimer is 0.62 and his B-  type natriuretic peptide is slightly elevated at 248.   His chest x-ray shows lower lung volumes, cardiomegaly, vascular congestion  with a question of mild edema.   ASSESSMENT:  Chest pain, question of cardiac origin, more likely gastric.   PLAN:  Will give him GI cocktail, but admit him on a rule out MI protocol.  Get GI consult and will probably benefit from endoscopy.  Will do an  ultrasound of his abdomen to rule out gallbladder disease and get liver  function tests.  Other plans as per orders.      LC/MEDQ  D:  03/13/2005  T:  03/13/2005  Job:  [id number]

## 2011-04-18 NOTE — H&P (Signed)
NAMEELY, BALLEN                          ACCOUNT NO.:  [id number]   MEDICAL RECORD NO.:  [id number]                   PATIENT TYPE:  OBV   LOCATION:  A207                                 FACILITY:  [facility]   PHYSICIAN:  Annia Friendly. Loleta Chance, M.D.                DATE OF BIRTH:  February 22, 1966   DATE OF ADMISSION:  10/19/2002  DATE OF DISCHARGE:                                HISTORY & PHYSICAL   The patient is a 45 year old married disabled black male from [place], [place].   HOSPITAL COURSE:  Chest pain.   HISTORY OF PRESENT ILLNESS:  Chest pain started around 2100 on October 18, 2002.  Pain was confined to left chest area.  Pain was described as dull.  Duration of pain was 10 minutes.  He experienced repeat episode of left  chest pain after lunch on the day of admission.  Home health nurse noticed  irregular heartbeat with complaint of shortness of breath and complaint of  chest pain.  Therefore, the nurse contacted his primary care taker, Dr.  Lodema Hong, about patient's symptoms.  Dr. Lodema Hong suggested that patient go to  the emergency room for evaluation.  History is also positive for nausea and  regurgitation of white phlegm within the past 24 hours.  The patient denies  syncope, left arm pain, neck pain with chest pain.   PAST MEDICAL HISTORY:  Positive for morbid obesity, nonischemic  cardiomyopathy, hypertension, sleep apnea, and GE reflux disease.  Medical  history is negative for diabetes, tuberculosis, cancer, sickle cell, asthma,  and seizure disorder.  Positive for hospitalization for atypical chest pain,  positive musculoskeletal in August 2003 at  [facility].  Surgery  for torsion of right testicle (status post orchiopexy) and hospitalization  for congestive heart failure February 2001.   HABITS:  Positive for cigarette smoking (one-half pack per day, started at  age 89) and ethanol use (former beer use, but current consumption of gin one  pint two to three  times per month).  The patient denied use of street drugs.  Sexually transmitted disease history is positive for gonorrhea infections x2  (treated at the emergency room at [facility] and at private physician  office).  Sexually transmitted disease history is negative for syphilis, HIV  infection, and herpes.   ALLERGIES:  The patient is not allergic to any known medication.   MEDICATIONS:  1. Demadex 20 mg p.o. q.d.  2. Norvasc 10 mg p.o. q.d.  3. Maxzide 25 mg p.o. q.d.  4. K-Dur 20 mEq p.o. q.d.  5. Prevacid 30 mg p.o. b.i.d.  6. One aspirin (325 mg) p.o. q.d.  7. Reglan 10 mg p.o. before meals and bedtime.   REVIEW OF SYSTEMS:  Positive for orthopnea, episodic swelling of leg,  chronic episodic chest wall pain, snoring, chronic episodic heartburn, and  chronic episodic regurgitation.  Review of systems is  negative for nose  bleed, bleeding gum, hematemesis, wheezing, melena, diarrhea, constipation,  gross hematuria, dysuria, leg ulcer, unexplained fever, hemoptysis, etc.   FAMILY HISTORY:  Revealed mother living age 19 with history of hypertension.  Father living age 60 history of stroke and diabetes.  Four sisters living,  age 23 good health, age 24 good health, age 43 good health, age 46 good  health.  Eight brothers living age 23 good health, age 40 with history of  diabetes mellitus, age 58 good health, age 47 with history of seizure  disorder, age 32 good health, age 22 good health, age 30 good health.   PHYSICAL EXAMINATION:  GENERAL:  General appearance revealed an overweight  middle aged, medium height, alert black male in no apparent respiratory  distress.  VITAL SIGNS:  Temperature 98.6, pulse 88, respirations 22, blood pressure  128/66.  SKIN:  Warm and dry.  HEAD:  Normocephalic.  Ears:  Normal auricle.  Right external canal some  cerumen.  Tympanic membrane pearly grey.  Eyes, lids:  Negative for ptosis.  Sclerae white.  Right eyeball positive slight right  lateral deviation.  Pupils round and equal.  Extraocular movement intact.  No negative  discharge.  Mouth:  No oral lesion.  No bleeding gum.  Posterior pharynx  benign.  NECK:  Negative lymphadenopathy or thyromegaly.  LUNGS:  Clear.  HEART:  Audible S1, S2 without murmur, rub, or gallop.  Regular rate and  rhythm.  BREASTS:  Positive for gynecomastia bilaterally due to obesity.  CHEST WALL:  No lesion.  Positive tenderness on palpation of left pectoral  area.  ABDOMEN:  Obese.  Negative for hepatosplenomegaly.  Positive for mild mid  epigastric tenderness.  GENITALIA:  Penis uncircumcised.  No penile lesion or discharge.  Scrotum:  Palpable testicle bilaterally.  Right slightly larger than left.  RECTAL:  Deferred.  EXTREMITIES:  Both arms positive for tattoos.  Lower extremity positive for  mild tibial edema.  Soles of feet are positive for cracks bilaterally.  Palpable dorsalis pedis bilaterally.  NEUROLOGIC:  Alert and oriented to person, place, and time.  Cranial nerves  II-XII appeared intact.   LABORATORIES:  EKG demonstrated normal sinus rhythm with rate of 88, PR  interval of 0.16, nonspecific inferior T-wave abnormality.  Chest x-ray:  No  active disease.  Total CPK 224, CK-MB 1.4, troponin I 0.03.  Potassium 3.2,  sodium 137, chloride 101, CO2 27, glucose 175, BUN 18, creatinine 1.3,  calcium 9.8.  Echocardiogram on May 30, 2002 was read as mild left atrial  enlargement, mild to moderate left ventricular enlargement with improvement  in left ventricular systolic function since previous echocardiogram,  ejection fraction of 45-50%, moderate left ventricular hypertrophy, normal  aortic valve, normal right-sided structure and function, no obvious  pericardial effusion as read by Dr. Jesse Sans. Wall.   IMPRESSION:  Primary acute chest pain.   SECONDARY DIAGNOSES:  1. Morbid obesity.  2. Mild hypokalemia. 3. Hypertension.  4. Moderate left ventricular hypertrophy.  5.  Chronic tobacco use.  6. Sleep apnea.  7. Gastroesophageal reflux disease.   PLAN:  Admit for observation, telemetry x24 hours.  Hep-Lock.  O2 saturation  q.6h. x4.  Fasting lipid profile.  Diet:  1800 ADA 2 g sodium.  Repeat MET-7  in a.m.  Urinalysis, urine drug screen, thyroid laboratories.   DISCHARGE MEDICATIONS:  1. Demadex 20 mg p.o. q.d.  2. Norvasc 10 mg p.o. q.d.  3. Maxzide 25 mg p.o. q.d.  4. K-Dur 20 mEq p.o. now and q.d.  5. Prevacid 30 mg p.o. b.i.d.  6. Aspirin 325 mg p.o. q.d.  7. Reglan 10 mg p.o. 30 minutes before meals and bedtime.                                               Annia Friendly. Loleta Chance, M.D.    Levonne Hubert  D:  10/19/2002  T:  10/19/2002  Job:  [id number]

## 2011-04-18 NOTE — Discharge Summary (Signed)
NAMESEIBERT, [name] NO.:  [id number]   MEDICAL RECORD NO.:  [id number]          PATIENT TYPE:  OBV   LOCATION:  A208                          FACILITY:  [facility]   PHYSICIAN:  Vania Rea, M.D. DATE OF BIRTH:  10-26-1966   DATE OF ADMISSION:  03/13/2005  DATE OF DISCHARGE:  04/14/2006LH                                 DISCHARGE SUMMARY   PCP: Syliva Overman, MD   Cardiologist: Dani Gobble, MD   DISCHARGE DIAGNOSIS:  1.  Chest pain, myocardial infarction ruled out.  2.  Congestive heart failure, compensated.  3.  Hypertension uncontrolled.  4.  Morbid obesity.  5.  Depression versus bipolar disorder.  6.  Hypokalemia, treated.  7.  History of gastroesophageal reflux disease, refusing upper endoscopy.  8.  History of nonischemic cardiomyopathy.  9.  History of sleep apnea.   DISPOSITION:  Discharged to home.   CONDITION ON DISCHARGE:  Stable.   DISCHARGE MEDICATIONS:  1.  Lexapro 10 mg daily (new).  2.  Coreg 6.25 mg twice daily.  3.  Prevacid 30 mg or Prilosec twice daily.  4.  Demadex 10 mg daily.  5.  K-Dur 40 mEq daily, with water.  6.  Cardizem LA 360 mg daily.   HOSPITAL COURSE:  Please refer to admission history and physical dictated  yesterday.  This is a 45 year old morbidly obese African-American man with a  history of nonischemic cardiomyopathy, possibly related to alcohol abuse,  who came in with a history of binge drinking about a week before and chest  pain and tightness.  The patient was admitted to rule out myocardial  infarction.  Had three sets of cardiac enzymes, which were not typical of  ischemia.  However, he also was noted to have a slightly BNP and pulmonary  edema on chest x-ray.  He was diuresed.  This morning he said he passed a  lot of urine, and he feels much better.  The pain is gone and that in the  past, whenever he has become fluid overloaded, he has developed similar  chest pains.  When he is diuresed, the pain  goes away.   During the admission process, the patient displayed severe pressure of  thought and also described himself as feeling very depressed and tearful  sometimes.  He admits that his doctors have attempted to treat him with  antidepressants, but he has been reluctant to take them.  He has made one  visit to a psychiatrist, Dr. Omelia Blackwater but did not return.  This morning,  patient also revealed that, in childhood, he was treated with Thorazine, for  psychosis, involving hallucinations and had to be confined to an  institution.   The patient was started on Lexapro and has been recommended to the mental  health clinic or to return to Dr. Omelia Blackwater.  The implications of his  depression versus bipolar disorder on his overall care was discussed.  Patient continues to drink occasionally and to smoke, and patient was also  recommended to discontinue both.  Patient, because of his mental illness and  also partly because of  finances, according to him, has not been very  compliant with the medication.  The importance of compliance with his  medication was discussed.  Because of reluctance to take Demadex, because of  excess of urination, the Demadex dose was reduced to 10 mg, and patient has  been advised to adopt a strict fluid restriction.   The patient's blood pressure was uncontrolled this admission, on Coreg.  He  does have samples of Cardizem LA, and we have elected to let him use this  and continue Coreg at 6.25 mg twice daily, in order to help him out  financially.   The patient was referred to Dr. Jena Gauss, for possible upper endoscopy, for his  chest pain.  Patient refused endoscopy because the pain had cleared.  However, they recommended that he have Prilosec twice daily and that he  comes to see them as an outpatient, if he deems it necessary.   The patient complained, during the admission process, that he suffers severe  muscle cramps when he takes Demadex.  He was found to be  hypokalemic this  morning, and his potassium dose has been increased.   Patient gives a history of sleep apnea and that he was transitioned from  CPAP to BiPAP.  He has not been using his BiPAP for some months.  An ABG was  checked, and he pCO2 was low at 35.  We suggested to him that he use the  CPAP rather than BiPAP, which is what he prefers.  This morning the patient  is alert and oriented, very talkative, displaying some pressure of thought  but is not suicidal.   OBJECTIVE:  VITALS:  Temp is 97.4, pulse 70, respirations 20, blood pressure  150/98.  His weight is recorded as 427.7 pounds.  CHEST:  Clear to auscultation bilaterally.  CARDIOVASCULAR:  Regular rhythm.  ABDOMEN:  Obese.  Soft.  Nontender.  EXTREMITIES:  He has 1+ edema bilaterally.  He is morbidly obese; it makes  it difficult to assess edema.   LABORATORY DATA:  ABG, on room air on admission, pH was 7.43, pCO2 35, pO2  93, saturating at 97%.  His white count was 10.  His hemoglobin 15.  His  platelets 286.  This morning, his sodium is 135, potassium 3.2, chloride  105, CO2 24, glucose 119, BUN 13, creatinine 1.1.  His calcium was 8.5.  His  liver function tests were unremarkable.  His BNP, on admission yesterday,  was 248.      LC/MEDQ  D:  03/14/2005  T:  03/14/2005  Job:  [id number]

## 2011-04-18 NOTE — Consult Note (Signed)
[facility]  Patient:    [name], [name] Visit Number: [id number] MRN: [id number]          Service Type: MED Location: 2A A214 01 Attending Physician:  Dessa Phi Dictated by:   Valera Castle, M.D. Proc. Date: 05/30/02 Admit Date:  05/29/2002 Discharge Date: 05/30/2002   CC:         Syliva Overman, M.D.   Consultation Report  We were asked by Dr. Lodema Hong to evaluate [name], patient well known to me for some chest discomfort in the left upper chest.  [name] is a 45 year old black male with a history of a dilated cardiomyopathy, presumably alcohol related, hypertension, morbid obesity, obstructive sleep apnea on CPAP, hyperlipidemia, and tobacco use.  He also has a history of noncompliance, but recently has been very compliant with his medications per his family and him based on the fact that he has full coverage with medicare and medicaid.  He presented to the [facility] ER with left anterior chest pain and shortness of breath.  Chest x-ray showed cardiomegaly with a question of some heart failure.  He was given 40 mg of IV Lasix and diuresed 1 L.  His cardiac enzymes have been negative.  His other blood work has been unremarkable.  He remembers no chest trauma.  He is disabled.  MEDICATIONS PRIOR TO ADMISSION: 1. Maxzide 25 mg q.d. 2. Coreg 6.25 b.i.d. 3. Norvasc 10 mg q.d. 4. Aspirin 325 q.d. 5. Tricor 160 q.d. 6. K-Dur 20 mEq q.d. 7. Demadex 20 mg p.o. q.a.m. p.r.n. 8. CPAP at night.  PAST MEDICAL HISTORY:  Also significant for tobacco use smoking 10-15 cigarettes a day.  He is not drinking any alcohol at present.  Lives with his wife in [place] with two children.  FAMILY HISTORY:  Noncontributory except for a father who may have had a stroke, but unknown age.  REVIEW OF SYSTEMS:  Unremarkable except for the HPI.  PHYSICAL EXAMINATION  VITAL SIGNS:  Blood pressure 145/69, pulse 58 in sinus bradycardia, respiratory rate 20  and unlabored, temperature 98.7.  GENERAL:  He is in great spirits.  NEUROLOGIC:  Grossly intact.  He is in no acute distress.  HEENT:  Unremarkable.  NECK:  Good carotid upstrokes without bruits.  Could not assess JVD.  Thyroid was difficult to assess as well.  LUNGS:  Clear to auscultation and percussion except for decreased breath sounds in the bases.  HEART:  Regular rate and rhythm without gallop.  There is no rub.  He has obvious chest wall tenderness to palpation of the left upper chest under the shoulder and clavicle.  It does not radiate.  ABDOMEN:  Protuberant.  Good bowel sounds.  Organomegaly could not be assessed.  EXTREMITIES:  Trace pedal edema, good pulses.  LABORATORIES:  EKG shows sinus bradycardia with LVH and strain pattern.  He has ST segment changes inferiorly.  Comparison with previous ECG:  There has been no significant change.  Troponins x2 are negative.  ASSESSMENT AND PLAN: 1. Chest wall pain probably musculoskeletal reproducible by palpation.  The    patient has no history of coronary artery disease, though does have risk    for such.  EKG is unchanged and he has negative cardiac enzymes x2. 2. Dilated cardiomyopathy, presumably alcohol related.  Hypertension probably    also plays a major role as well as his obesity. 3. Morbid obesity. 4. Severe obstructive sleep apnea. 5. Hypertension which is under great control now that he is  taking his    medications. 6. Tobacco use. 7. History of noncompliance.  RECOMMENDATIONS:  A 2-D echocardiogram today which has been done.  I will review this and if it is stable he can go home this afternoon.  I would give him a short course of nonsteroidals and heat with no exacerbating activities for his chest wall pain.  Will plan on seeing him back in the clinic as scheduled in July.  Thank you very much for the consultation. Dictated by:   Valera Castle, M.D. Attending Physician:  Dessa Phi DD:   05/30/02 TD:  05/31/02 Job: [id number]

## 2011-04-18 NOTE — Assessment & Plan Note (Signed)
[facility]                          [facility] OFFICE NOTE   NAME:[name], [name]                       MRN:          [id number]  DATE:02/16/2007                            DOB:          1966-05-02    HISTORY OF PRESENT ILLNESS:  The patient is a 45 year old African  American male with a history of nonischemic cardiomyopathy.  The patient  was recently referred for cardiac catheterization.  The patient had  normal coronaries.  The patient has severe LV dysfunction with an  ejection fraction of 30%.  The patient also had elevated PA pressures.  Today in the office the patient remains hypertensive with a blood  pressure 134/90.  The patient stated that he has got charlie horses  which he attributed to low potassium.  Indeed, prior to his  catheterization potassium was 3.2 and was not supplemented.  Overall the  patient states that he is an NYHA Class 2B/3 which is unchanged.  Dr.  Regino Schultze recommendation from his last cardiac catheterization was to  increase the patient's diuretic treatment and pursue further aggressive  medical treatment.   MEDICATIONS:  1. Aspirin 81 mg a day.  2. Norvasc 10 mg p.o. daily.  3. Coreg 12.5 b.i.d.  4. Potassium 20 mEq p.o. b.i.d.  5. Torsemide 20 mg p.o. daily.  6. Protonix 40 mg a day.  7. Diovan HCTZ 80/12.5 daily.  8. Oxygen with BiPAP at night.   PHYSICAL EXAMINATION:  VITAL SIGNS:  Blood pressure 134/90, heart rate  71 beats per minute.  GENERAL:  Overweight African American male but in no distress.  NECK:  Normal carotid upstroke, no carotid bruits.  LUNGS:  Diminished breath sounds bilaterally but no crackles.  HEART:  Regular rate and rhythm, normal S1, S2, very distant heart  sounds.  EXTREMITY:  No cyanosis, clubbing, there is 1+ peripheral pitting edema.   PROBLEM LIST:  1. Dyspnea (stable).      a.     Nonischemic cardiomyopathy.      b.     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      c.     Hypertensive heart disease with marked increase in septal       thickness.      d.     Left ventricular dysfunction, ejection fraction 30% by echo       and by cath recently.      e.     NYHA Class 2B/3.  2. History of __________.  3. Substernal chest pain, ruled out for ischemic heart disease.  4. Some tobacco use.  5. Sleep apnea, no definite pulmonary hypertension by right heart      cath,  6. ACE INHIBITOR ALLERGY.  7. Obesity.  8. Psychiatric disorder.  9. Gastroesophageal reflux disease.  10.Noncompliance.  11.Muscle cramps, rule out hypokalemia.   PLAN:  1. I have asked the patient to decrease Torsemide to 10 mg a day.  As      a matter of fact, the patient is not compliant with the 20 mg and  only takes this as needed.  Therefore I have asked him please to      take a daily dose of Torsemide and be compliant with his regimen.  2. I will increase Diovan HCTZ 160/25 mg p.o. daily.  This represents      2 tablets of his current dose.  3. I have also increased the patient's potassium to 40 mEq p.o. twice      a day.  4. The patient can follow up with [place] in the next couple of week and we      will draw a BMET in one week.  Will continue to pursue medical      therapy and if the patient is compliant will continue to monitor      his ejection fraction.  If it remains low we will need to consider      him for implantable cardioverter defibrillator implant in the      future.     Learta Codding, MD,FACC  Electronically Signed    GED/MedQ  DD: 02/16/2007  DT: 02/17/2007  Job #: [id number]

## 2011-04-18 NOTE — Cardiovascular Report (Signed)
[name], Adam                ACCOUNT NO.:  [id number]   MEDICAL RECORD NO.:  [id number]          PATIENT TYPE:  OIB   LOCATION:  1962                         FACILITY:  [facility]   PHYSICIAN:  Bruce R. Juanda Chance, MD, FACCDATE OF BIRTH:  [birth year]   DATE OF PROCEDURE:  02/09/2007  DATE OF DISCHARGE:                            CARDIAC CATHETERIZATION   PROCEDURE:  Right and left heart catheterization and coronary  angiography.   CLINICAL HISTORY:  [name] is 45 years old and has a history of cardiac  cardiomyopathy felt to probably be nonischemic with an ejection fraction  of 30% by echo and associated with LVH.  He has a history of  hypertension and excess weight.  He also has continued cigarette use and  sleep apnea treated with CPAP.  Dr. Andee Lineman saw him recently with  increasing symptoms of shortness of breath and volume overload, and  increase in his torsemide and put him on Coreg.  He scheduled him for  evaluation with angiography to rule out ischemic heart disease and also  to evaluate his pulmonary artery pressures.   PROCEDURE:  Right heart catheterization was performed percutaneously via  the right femoral vein using a venous sheath and Swan-Ganz  thermodilution catheter.  Left heart catheterization was performed  percutaneously via the right femoral artery using arterial sheath and 5-  [place] preformed coronary catheters.  We had to use an FL-5 to  selectively engage the left coronary artery.  The patient tolerated the  procedure well and left the laboratory in satisfactory condition.   RESULTS:  Left main coronary artery:  The left main coronary artery was  free of significant disease.   Left anterior descending artery:  The left anterior descending artery  gave rise to 2 diagonal branches and 3 septal perforators.  These and  the LAD proper were free of significant disease.   Circumflex artery:  The circumflex artery gave rise to a ramus branch,  an atrial branch  and a posterolateral branch.  These vessels were free  of significant disease.   Right coronary artery:  The right coronary was a moderately large vessel  that gave rise to a conus branch, a right ventricular branch, a  posterior descending branch, and a posterolateral branch.  These vessels  were free of significant disease.   LEFT VENTRICULOGRAM:  The left ventriculogram performed in the RAO  projection showed global hypokinesis with an estimated ejection fraction  of 30%.   HEMODYNAMIC DATA:  The right atrial pressure was 9 mean.  The pulmonary  artery pressure was 45/22 with a mean of 33.  The pulmonary wedge  pressure was 19 mean.  The left ventricular pressure is 145/32.  Aortic  pressure 145/93 with a mean of 115.  Cardiac output/cardiac index were  10.9 and 2.2 L per sq m by Fick.   CONCLUSION:  1. Normal coronary angiography.  2. Severe left ventricular dysfunction with an ejection fraction of      30% and a pulmonary wedge pressure of 19, indicative of a      nonischemic cardiomyopathy.  3. Moderate  elevation of pulmonary artery pressures.   RECOMMENDATIONS:  The patient has a nonischemic cardiomyopathy and we  will plan continued medical therapy.  We will encourage him with  compliance regarding cigarettes, weight reduction and a low-salt diet  and compliance with his medications as prescribed by Dr. Andee Lineman.  He  might have slight volume overload and I will defer further adjustments  in his diuretics and other medications to Dr. Andee Lineman in followup.      Bruce Elvera Lennox Juanda Chance, MD, [facility]  Electronically Signed     BRB/MEDQ  D:  02/09/2007  T:  02/11/2007  Job:  [id number]   cc:   Learta Codding, MD,FACC  [facility], [place], [place]  [place], [place] Wende Crease MD

## 2011-04-18 NOTE — Consult Note (Signed)
[name], [name] NO.:  [id number]   MEDICAL RECORD NO.:  [id number]          PATIENT TYPE:  EMS   LOCATION:  ED                            FACILITY:  [facility]   PHYSICIAN:  Vania Rea, M.D. DATE OF BIRTH:  1966/09/30   DATE OF CONSULTATION:  DATE OF DISCHARGE:                                   CONSULTATION   PHYSICIAN REQUEST THE CONSULT:  Rhae Lerner. Margretta Ditty, M.D.   PRIMARY CARE PHYSICIAN:  Milus Mallick. Lodema Hong, M.D.   CARDIOLOGIST:  Dani Gobble, M.D.   REASON FOR CONSULT:  Shortness of breath.   HISTORY OF PRESENT ILLNESS:  This is a 45 year old, morbidly obese, African  American man, discharged from this service about a month ago after being  admitted for chest pain and shortness of breath. He was treated for  uncontrolled hypertension, congestive heart failure,bipolar disorder versus  schizoaffective disorder; myocardial infarction was ruled out.  His chest  pain was felt to be gastroesophageal, nut he refused endoscopy. At the time  of discharge, the patient was advised to follow up with the mental health  service.  He was also advised to follow up with his primary care physician,  cardiologist and the gastrointestinal service.  To date, the patient has  only followed up with mental health.  His second appointment last week was  postponed by that service.  The patient apparently has not been taking  Lexapro, which was prescribed, but has been taken Maxzide along with other  prescribed medications.  The patient apparently had a quarrel with his wife  today.  His wife reports that he has been acting strange for some time,  worse today, being very argumentative.  He came to the emergency room and  complained of shortness of breath.  He has had blood work and chest x-ray.  He does not appear to be severely decompensated.  His BNP is only 240.  On  approaching this patient to take a history, he is accusing me of being  against him like everybody else,  including his wife.  He is currently  refusing to talk to his mother, who is trying to get him on the phone, and  generally acting very belligerent.  He seems to be displaying some  delusional activity. When discussing his medications, he seems to recall my  telling him that I was taking antipsychotic medication.   he describes a GERD-like chest pain, similar to his previous visit   PAST MEDICAL HISTORY:  1. Congestive heart failure.  2. Hypertension.  3. Morbid obesity.  4. 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  5. Recurrent hypokalemia.  6. History of GERD.  Refused endoscopy on the last admission.  7. History of nonischemic cardiomyopathy.  8. History of sleep apnea.     MEDICATIONS:  Prescribed:  1. Lexapro 10 mg daily.  2. Coreg 6.25 mg twice daily.  3. Prevacid or Prilosec twice daily.  4. Demadex 10 mg daily.  5. K-Dur 40 mEq daily.  6. Cardizem LA 360 mg daily.   The  patient appears in addition to be taking Maxzide, but apparently is not  taking Lexapro.   ALLERGIES:  ACE INHIBITORS cause angioedema.   SOCIAL HISTORY:  Smokes one pack a day.  Binge drinks alcohol on occasion.  Denies illicit drug use.  Presently on disability.   FAMILY HISTORY:  Significant for mother with hypertension, father with  diabetes and stroke and a younger brother with mental health problems.  He  has two teenage children who are in good health.   REVIEW OF SYSTEMS:  The 10-point review of systems is positive for episodic  chest pains, fullness, nausea, anorexia, dyspnea on exertion and feeling  sick all of the time.  PSYCHIATRIC:  He feels as if everybody is against him  and feels as if he would just end everything by killing himself.   PHYSICAL EXAMINATION:  GENERAL APPEARANCE:  A morbidly obese African  American gentleman sitting up in bed, very talkative.  His mood swings.  Sometimes he is friendly and sometimes he appears to be  very aggressive and  irritable.  VITAL SIGNS:  His temperature is 97 degrees, his pulse is 74, blood pressure  146/96, his respiratory rate is 20 and his pulse oximetry is 95% on room  air.  CHEST:  Clear to auscultation bilaterally.  CARDIOVASCULAR:  Regular rhythm.  ABDOMEN:  Obese, soft and nontender.  EXTREMITIES:  He has trace edema bilaterally.   LABORATORIES:  His white count is 8.4, hemoglobin 14.3, MCV 80 and platelets  283.  His sodium is 137, potassium 3.3, chloride 108, CO2 23, glucose 97,  BUN 13 creatinine 1.2, calcium 8.4, total protein 6.9, albumin 3.3, AST 14,  ALT 19, alkaline phosphatase 74 and total bilirubin 1.0.  Beta natruretic  peptide 241.  His urinalysis is remarkable only for proteins of 30.  It is  otherwise bland.  He has received 40 mg of Lasix intravenously and passed  out 650 mL of urine.  His chest x-ray shows cardiomegaly without acute  infiltrate or vascular congestion.  His EKG shows normal sinus rhythm.   ASSESSMENT:  1. Psychotic disorder with mild decompensation, and delusional ideation  2. Congestive heart failure with mild decompensation.  Can be managed as      an outpatient.  3. Persistent epigastric pain, probably needs endoscopy.  4. Hypertension, uncontrolled.  5. Noncompliance with medical followup.     PLAN:  Will recommend urine drug screen.  Will recommend evaluation by the  behavioral health team with a view to getting an early appointment.  Will  uptitrate his Demadex, as well as increase his potassium.  Will refer him  back to his cardiologist for further up titration of his antihypertensive  medications.  Will recommend once again that he goes to see Drs. Rehman or  Rourk for endoscopy.  Will emphasize the importance of continued PPI use.      LC/MEDQ  D:  04/29/2005  T:  04/29/2005  Job:  [id number]

## 2011-04-18 NOTE — H&P (Signed)
[facility]  Patient:    [name], [name] Visit Number: [id number] MRN: [id number]          Service Type: MED Location: 2A A214 01 Attending Physician:  Dessa Phi Dictated by:   Elpidio Anis, M.D. Admit Date:  05/29/2002                           History and Physical  HISTORY OF PRESENT ILLNESS:  This is a 45 year old male with history of acute onset of left-sided chest pain while in church.  He states that pain was in his left upper chest and subclavicular area radiating through to his neck and down his left arm.  He states that his arm became numb.  He had some altered mental status and loss of memory during this time.  He did not have a syncopal episode.  He was taken to the emergency room at [facility] where on evaluation, he was found to be in no acute distress.  He was treated with nitrates and given Lasix.  Cardiac enzymes were done and were negative for myocardial injury showing no acute changes with increased QRS pattern with LVH.  Chest x-ray showed cardiomegaly with some interstitial changes suggestive of early interstitial edema.  The patient was treated and symptoms improved.  He still complained of pain in his left chest.  He was transferred here because of his need for continued care was felt to be better facilitated because he has been followed here throughout his illness.  The patient is in no acute distress at this time, but he will be admitted and followed up with echocardiogram, stress test and cardiac enzymes.  PAST MEDICAL HISTORY: 1. Nonischemic cardiomyopathy which was documented in 2001.  Ejection fraction    of 35% at that time.  It was felt that the source of his cardiomyopathy is    probably due to long-term alcohol use, massive obesity and untreated    hypertension. 2. He is disabled because of his massive obesity and his cardiomyopathy. 3. History of testicular torsion, status post orchiopexy. 4. History of  alcohol abuse at an early age and only stopped about a year or    two ago.  There is no documented history of drug use.  MEDICATIONS: 1. Coreg 6.25 mg b.i.d. 2. Maxzide 25 mg q.d. 3. Demodex 20 mg q.d. 4. Norvasc 10 mg q.d. 5. Aspirin 325 mg q.d.  FAMILY HISTORY:  Positive for hypertension and stroke.  PHYSICAL EXAMINATION:  GENERAL:  Morbidly obese male who is in no acute distress.  VITAL SIGNS:  Blood pressure 130/70, pulse 70, respiratory rate 22, temperature 98, O2 saturations 98% on 2 L.  HEENT:  Unremarkable.  NECK:  Supple.  There is no tenderness of his neck, although there is some tenderness of the left supraclavicular space.  CHEST:  Moderate tenderness of the left anterior chest over the pectoralis major extending into the subclavicular area out to the acromioclavicular joint.  Pain with motion of the left shoulder is unremarkable.  LUNGS:  Good air movement, but with bibasilar rales.  HEART:  Regular rate and rhythm, no murmurs, rubs or gallops.  ABDOMEN:  Obese, soft, nontender, no masses.  EXTREMITIES:  2+ edema in the left lower extremity with less edema in the right lower extremity.  No major joint deformity.  NEUROLOGIC:  No focal motor or sensory deficits.  IMPRESSION: 1. Recurrent chest pain, possibly musculoskeletal in origin, but must  rule out    ischemic etiology. 2. Nonischemic cardiomyopathy with possible mild congestive heart failure. 3. Morbid obesity. 4. Hypertension.  PLAN: 1. The patient will be continued on his regular medications. 2. He will use his CPAP machine from home. 3. Will get an echocardiogram since he has not had one in 2-1/2 years. 4. He will have repeat stress test to rule out inducible ischemia. 5. Cardiac enzymes with troponin will be repeated. 6. Followup EKG will be repeated. 7. He will receive Indocin 50 mg three times a day for tenderness of his left    chest wall. 8. We will give him Pepcid for ulcer  prophylaxis. 9. Sedimentation rate will be checked and we will have cardiology evaluate him    in the morning. Dictated by:   Elpidio Anis, M.D. Attending Physician:  Dessa Phi DD:  05/29/02 TD:  05/29/02 Job: [id number]

## 2011-05-05 ENCOUNTER — Other Ambulatory Visit: Payer: Self-pay | Admitting: *Deleted

## 2011-05-05 MED ORDER — PANTOPRAZOLE SODIUM 40 MG PO TBEC: 40.0000 mg | DELAYED_RELEASE_TABLET | Freq: Every day | ORAL | Status: DC

## 2011-05-07 ENCOUNTER — Encounter: Payer: Self-pay | Admitting: *Deleted

## 2011-05-08 ENCOUNTER — Ambulatory Visit (INDEPENDENT_AMBULATORY_CARE_PROVIDER_SITE_OTHER): Payer: Medicare Other | Admitting: Cardiology

## 2011-05-08 ENCOUNTER — Encounter: Payer: Self-pay | Admitting: Cardiology

## 2011-05-08 VITALS — BP 176/118 | HR 65 | Ht 68.0 in | Wt >= 6400 oz

## 2011-05-08 DIAGNOSIS — F329 Major depressive disorder, single episode, unspecified: Secondary | ICD-10-CM | POA: Insufficient documentation

## 2011-05-08 DIAGNOSIS — I5023 Acute on chronic systolic (congestive) heart failure: Secondary | ICD-10-CM

## 2011-05-08 DIAGNOSIS — G4730 Sleep apnea, unspecified: Secondary | ICD-10-CM

## 2011-05-08 DIAGNOSIS — I10 Essential (primary) hypertension: Secondary | ICD-10-CM

## 2011-05-08 DIAGNOSIS — I5022 Chronic systolic (congestive) heart failure: Secondary | ICD-10-CM

## 2011-05-08 DIAGNOSIS — I509 Heart failure, unspecified: Secondary | ICD-10-CM

## 2011-05-08 DIAGNOSIS — F32A Depression, unspecified: Secondary | ICD-10-CM

## 2011-05-08 DIAGNOSIS — G4700 Insomnia, unspecified: Secondary | ICD-10-CM

## 2011-05-08 DIAGNOSIS — I4891 Unspecified atrial fibrillation: Secondary | ICD-10-CM

## 2011-05-08 MED ORDER — TRAZODONE HCL 100 MG PO TABS: ORAL_TABLET | ORAL | Status: DC

## 2011-05-08 MED ORDER — DIAZEPAM 10 MG PO TABS: ORAL_TABLET | ORAL | Status: DC

## 2011-05-08 MED ORDER — CITALOPRAM HYDROBROMIDE 20 MG PO TABS: ORAL_TABLET | ORAL | Status: DC

## 2011-05-08 NOTE — Assessment & Plan Note (Signed)
Noncompliance: Plans for ICD put on hold. History of elective admission to [facility] 2011 for intravenous dobutamine. At this point unless the patient's problems with his mental health are straightened out as well as as his noncompliance and tobacco use as well as alcohol use I do not consider him a candidate for ICD treatment

## 2011-05-08 NOTE — Progress Notes (Signed)
HPI the patient is a 45 year old male with history of nonischemic cardiomyopathy, ejection fraction approximately 25-30%. The patient was admitted to September 12, 2010 with rapid atrial fibrillation and  panic attacks. He has a CHADS2 of 2 but has been noncompliant with medical therapy and is not felt to be a particular good candidate for Coumadin. The patient is currently back in normal sinus rhythm.  the patient has been treated also for significant anxiety and has much improved with clonazepam and citalopram. The patient today complains of a lot of fatigue. He feels depressed. He states that he always nonsustained house and has now developed agoraphobia. He also has an crease distress eating. Has frequent palpitations he states that he can't focus and has lost interest in even the smallest things in life. He states that he just slipped through the channels on his TV but that doesn't even attention to anything that's going on. He has severe insomnia. He also has some difficulty with BiPAP waking him up at times. He states that many nights he stays up until 5:00 in the morning only to fall asleep and for a few hours. He states that he feels useless and without purpose in his life. He doesn't feel that his medications aren't working. Unfortunately he remains noncompliance and sometimes takes his clonazepam at other times he doesn't and then he goes through withdrawal. Also his Celexa is not taking always regularly and I warned him about withdrawal symptoms with this drug also making the situation much worse. From a cardiac perspective he has gained a lot of fluid again. We will ask him to increase his torsemide 20 mg twice a day twice a day to take for 7 days. I also asked him not to use any alcohol particularly in conjunction with his benzodiazepines which again will also make him more depressed. He also continues to smoke and asked him also to discontinue this. He reports palpitations tightness which seems to  be related to anxiety and depression. EKG normal sinus rhythm. Left ventricular hypertrophy nonspecific T-wave changes.  Allergies  Allergen Reactions  . Ace Inhibitors     REACTION: throat swelling  . Buspirone     dizziness    Current Outpatient Prescriptions on File Prior to Visit  Medication Sig Dispense Refill  . amLODipine (NORVASC) 10 MG tablet Take 10 mg by mouth daily.        Marland Kitchen aspirin 81 MG tablet Take 81 mg by mouth daily.        . clonazePAM (KLONOPIN) 1 MG tablet Take one table three times per day  90 tablet  1  . metoprolol (TOPROL-XL) 50 MG 24 hr tablet Take 50 mg by mouth 2 (two) times daily.        . pantoprazole (PROTONIX) 40 MG tablet Take 1 tablet (40 mg total) by mouth daily.  30 tablet  6  . spironolactone (ALDACTONE) 25 MG tablet Take 25 mg by mouth daily.        Marland Kitchen torsemide (DEMADEX) 20 MG tablet Take 20 mg by mouth daily.        . valsartan (DIOVAN) 80 MG tablet Take 80 mg by mouth 2 (two) times daily.        Marland Kitchen DISCONTD: citalopram (CELEXA) 20 MG tablet Take 20 mg by mouth daily.        Marland Kitchen DISCONTD: aspirin-acetaminophen-caffeine (EXCEDRIN MIGRAINE) 250-250-65 MG per tablet Take 1 tablet by mouth every 6 (six) hours as needed.        Marland Kitchen  DISCONTD: oxyCODONE-acetaminophen (PERCOCET) 7.5-500 MG per tablet Take 1 tablet by mouth every 6 (six) hours as needed.        Marland Kitchen DISCONTD: polyethylene glycol (MIRALAX / GLYCOLAX) packet Take 17 g by mouth daily.          Past Medical History  Diagnosis Date  . Chronic systolic heart failure   . Edema   . Atrial fibrillation   . Obesity, unspecified   . Anxiety state, unspecified   . Psychiatric disorder   . Obstructive sleep apnea   . History of medication noncompliance   . Alcohol abuse   . Shortness of breath     No past surgical history on file.  Family History  Problem Relation Age of Onset  . Hypertension      Family History  . Stroke      Family History  . Diabetes      Family History    History    Social History  . Marital Status: Married    Spouse Name: N/A    Number of Children: 3  . Years of Education: N/A   Occupational History  . DISABLED    Social History Main Topics  . Smoking status: Current Everyday Smoker -- 1.0 packs/day for 30 years    Types: Cigarettes  . Smokeless tobacco: Never Used   Comment:    Packs/Day: 1/2 PPD  . Alcohol Use: Yes  . Drug Use: No  . Sexually Active: Not on file   Other Topics Concern  . Not on file   Social History Narrative    He smokes about a pack per day and he has been smoking  since he was 45 years of age.  He drinks alcohol occasionally, but he  denies any illicit drug abuse.  He is presently on disability.      UEA:[id number] positives as outlined above. The remainder of the 18  point review of systems is negative   PHYSICAL EXAM BP 176/118  Pulse 65  Ht 5\' 8"  (1.727 m)  Wt 433 lb (196.408 kg)  BMI 65.84 kg/m2  General: Obese African American male  Head: Normocephalic and atraumatic Eyes:PERRLA/EOMI intact, conjunctiva and lids normal Ears: No deformity or lesions Mouth:normal dentition, normal posterior pharynx Neck: Supple, no JVD.  No masses, thyromegaly or abnormal cervical nodes Lungs: Normal breath sounds bilaterally without wheezing.  Normal percussion Cardiac: regular rate and rhythm with normal S1 and S2, no S3 or S4.  PMI is normal.  No pathological murmurs Abdomen: Normal bowel sounds, abdomen is soft and nontender without masses, organomegaly or hernias noted.  No hepatosplenomegaly MSK: Back normal, normal gait muscle strength and tone normal Vascular: Pulse is normal in all 4 extremities Extremities: 3+ pitting edema Neurologic: Alert and oriented x 3 Skin: Intact without lesions or rashes Lymphatics: No significant adenopathy Psychologic: Extremely depressed affect   ECG: Not available  ASSESSMENT AND PLAN

## 2011-05-08 NOTE — Assessment & Plan Note (Signed)
The patient remains in normal sinus rhythm.

## 2011-05-08 NOTE — Assessment & Plan Note (Signed)
Acute on chronic systolic heart failure: Ejection fraction 30%/nonischemic cardiomyopathy, will increase torsemide 20 mg by mouth twice a day x7 days then he can resume 20 mg a day.

## 2011-05-08 NOTE — Patient Instructions (Signed)
   Increase Citalopram to 30mg  daily  Trazodone 100mg  - may begin with 1/2 tab, can increase to whole tab if still have insomnia after 2-3 days  Increase Torsemide to twice a day  X 7 days, then resume previous dose  Finish Clonazepam, then begin Valium 20mg  three times per day  Your physician wants you to follow up in:  3 months.  You will receive a reminder letter in the mail one-two months in advance.  If you don't receive a letter, please call our office to schedule the follow up appointment.

## 2011-05-08 NOTE — Assessment & Plan Note (Signed)
Patient's blood pressure is poorly controlled but the patient did not take all his medications. He actually stopped his Norvasc but states that he will resume it.

## 2011-05-08 NOTE — Assessment & Plan Note (Signed)
Patient only uses intermittently his CPAP mask. He continues to have difficulty sleeping. I gave him a prescription of trazodone which should help with this depression at 50 mg by mouth each bedtime to increase to 100 mg by mouth each bedtime if needed.

## 2011-05-08 NOTE — Assessment & Plan Note (Signed)
Anxiety/depression. The patient has profound symptoms of depression. We will increase his Celexa to 30 mg a day and add trazodone as augmentation therapy up to 100 mg by mouth each bedtime also to help him with insomnia. I told the patient that I would really like for him to see a psychiatrist but he cannot afford this. He also does not want to go to mental health because he states that he doesn't feel that he is being help there. He has been given Thorazine and Seroquel before there and he states that her he did very poorly with his medications. He is also trying to look for new primary care physician. I also switched his clonazepam at equivalent dose of Valium 20  mg 3 times a day as this turned out to be $13 cheaper than his clonazepam.

## 2011-06-11 ENCOUNTER — Telehealth: Payer: Self-pay | Admitting: *Deleted

## 2011-06-11 DIAGNOSIS — F419 Anxiety disorder, unspecified: Secondary | ICD-10-CM

## 2011-06-11 NOTE — Telephone Encounter (Signed)
Patient left message on voicemail that he was unable to take the Valium & had to go back to his Clonazepam.  Also, request that we sign script for Pos-T-Vac System.    Discussed above with patient.  States that the Valium actually cost more than the Clonazepam so he only was able to buy half the prescription.  [place] Spaniel it just hit him too hard.  Made him feel nauseated and sick.  Is requesting refill on this.  Advised him that you would not be back in the office till 7/16.  Also, was requesting that we sign script for vacuum device for erectile dysfunction.  Strongly encouraged patient to establish with PMD to treat his issues of depression & erectile dysfunction.  Script for device was declined.  Advised patient that I would send message to MD to see if he would give refill till establish with PMD.

## 2011-06-16 MED ORDER — CLONAZEPAM 1 MG PO TABS: ORAL_TABLET | ORAL | Status: DC

## 2011-06-16 NOTE — Telephone Encounter (Signed)
Patient get a refill on his prior dosing regimen of clonazepam for 30 days. He needs to find a primary care physician however. I am giving him this prescription so that he does not develop withdrawal symptoms including seizures can happen at this high dose.

## 2011-06-16 NOTE — Telephone Encounter (Signed)
Patient notified of below.  Will print script for Clonazepam & he will pick up tomorrow afternoon.  Stated he did find PMD in [place].  Dr. Milinda Antis will be joining Dr. Syliva Overman in August.  States he has appointment scheduled for August 10.

## 2011-07-11 ENCOUNTER — Encounter: Payer: Self-pay | Admitting: Family Medicine

## 2011-07-11 ENCOUNTER — Ambulatory Visit (INDEPENDENT_AMBULATORY_CARE_PROVIDER_SITE_OTHER): Payer: Medicare Other | Admitting: Family Medicine

## 2011-07-11 VITALS — BP 120/90 | HR 63 | Ht 68.5 in | Wt >= 6400 oz

## 2011-07-11 DIAGNOSIS — G4700 Insomnia, unspecified: Secondary | ICD-10-CM | POA: Insufficient documentation

## 2011-07-11 DIAGNOSIS — F32A Depression, unspecified: Secondary | ICD-10-CM

## 2011-07-11 DIAGNOSIS — N529 Male erectile dysfunction, unspecified: Secondary | ICD-10-CM | POA: Insufficient documentation

## 2011-07-11 DIAGNOSIS — F329 Major depressive disorder, single episode, unspecified: Secondary | ICD-10-CM

## 2011-07-11 DIAGNOSIS — I509 Heart failure, unspecified: Secondary | ICD-10-CM

## 2011-07-11 DIAGNOSIS — I10 Essential (primary) hypertension: Secondary | ICD-10-CM

## 2011-07-11 MED ORDER — METOPROLOL SUCCINATE ER 50 MG PO TB24: 50.0000 mg | ORAL_TABLET | Freq: Two times a day (BID) | ORAL | Status: DC

## 2011-07-11 MED ORDER — CITALOPRAM HYDROBROMIDE 20 MG PO TABS: ORAL_TABLET | ORAL | Status: DC

## 2011-07-11 MED ORDER — AMLODIPINE BESYLATE 10 MG PO TABS: 10.0000 mg | ORAL_TABLET | Freq: Every day | ORAL | Status: DC

## 2011-07-11 MED ORDER — VALSARTAN 80 MG PO TABS: 80.0000 mg | ORAL_TABLET | Freq: Two times a day (BID) | ORAL | Status: DC

## 2011-07-11 MED ORDER — TORSEMIDE 20 MG PO TABS: 20.0000 mg | ORAL_TABLET | Freq: Every day | ORAL | Status: DC

## 2011-07-11 NOTE — Assessment & Plan Note (Signed)
Pt wants to loose weight, will work with him, discussed use of gym and pool, he states he is often afraid he will get sick while out walking

## 2011-07-11 NOTE — Assessment & Plan Note (Signed)
He appears to have severe depression which has been ongoing for some time. He truly needs a psychiatrist secondary to the multiple medications he has tried and with his cardiac complications medications would need to be followed very closely. I refilled his clonazepam for today. As well as the Celexa. I have advised him to use the trazodone for insomnia. He does not appear suicidal at today's visit. Our for him to psychiatry.

## 2011-07-11 NOTE — Progress Notes (Signed)
  Subjective:    Patient ID: [name], male    DOB: [birth year], 45 y.o.   MRN: [id number]  HPI Patient is here to establish care. He has multiple concerns and multiple comorbidities. He also needs his medications refilled today.  CHF- history of CHF and cardiomyopathy. His EF is 25-30% per his last cardiology note. He is currently maintained on Demadex but states he does not take it every day like he should. He also has not been taking spironolactone as prescribed. He gets swelling in his lower extremities worse on days when he has missed his medications. He he denies chest pain at this time but has shortness of breath with minimal exertion.  Hypertension- he's not taken most of his meds today. He knows he supposed to be on Norvasc but has not taken some time. His cardiologist has her regimen for him but he states sometimes he is scared to take all of his medications. He has not had his yearly blood work done by his cardiologist ordered.  A.fib- history of A. fib. He states he feels his heart going fast at times when he is panic attacks. His rate is being controlled with metoprolol. He is not on any blood thinner secondary to noncompliance from the previous cardiologist's note.  Depression/anxiety- patient has severe depression and anxiety. He also gives history of panic attacks. His cardiologist has been maintaining his medications. He has started him on Celexa at 30 mg total daily. He also has him on Klonopin 3 times a day. He has been on multiple medications in the past and has seen psychiatry in the past. His last cardiology note states that he is recommended psychiatrist for him again. He is having difficulty with his wife currently and is also undergoing a custody battle with his previous partner. He states he does not like living here in the [place] and will apply to return home but is unable to. He is on disability secondary to his sleep apnea and cardiovascular disease. He is at his heaviest  weight over 400 pounds. He is also noted that he has problems with libido and erectile dysfunction. He did bring in paper for a vacuum pump and asked me about this. He has difficulty sleeping. He was prescribed trazodone however he has not taken this yet. Of note his cardiologist did try him on thallium however this was too expensive and he states it made him drunk and wore off to quick.   Review of Systems GEN- + fatigue, fever, weight loss,weakness, recent illness HEENT- denies eye drainage, change in vision, nasal discharge, CVS- denies chest pain, +palpitations RESP- +SOB w/exertion, denies cough, wheeze ABD- denies N/V, change in stools, abd pain GU- denies dysuria, hematuria, dribbling, incontinence MSK- + joint pain, muscle aches, injury Neuro- + headache,denies dizziness, syncope, seizure activity       Objective:   Physical Exam GEN- NAD, alert and oriented x3, morbidly obese HEENT- PERRL, EOMI, non injected sclera, pink conjunctiva, MMM, oropharynx clear Neck- Supple, no bruit, unable to appreciate JVD (large neck) CVS- RRR, no murmur RESP-CTAB, decreased at bases and overall lung volume EXT- 1+ edema Pulses- Radial, DP- 2+ Psych- depressed appearing, not axious appearing, flat affect, good eye contact, no apparent hallucinations       Assessment & Plan:

## 2011-07-11 NOTE — Assessment & Plan Note (Signed)
We will discuss this in further detail next visit. I did state that his depression and anxiety so severe that this is likely the main cause for his erectile dysfunction. His libido is very low. I will obtain a testosterone level. His antidepressants also likely contributing but these are needed at this time.

## 2011-07-11 NOTE — Patient Instructions (Signed)
For your blood pressure take all medications as prescribed  I will send you to a psychiatrist  Start the trazodone 50mg , take 1 hour before bedtime I advise you to quit smoking  Try to look at a gym membership with a swimming pool Get your labs done before our next visit  Follow-up  in 4 weeks

## 2011-07-11 NOTE — Assessment & Plan Note (Signed)
Patient is willing to try the trazodone. I will discontinue his bedtime dose of clonazepam at this time. It started the trazodone at bedtime. Patient is very afraid of medications and therefore we'll be able to see how he does with the trazodone.

## 2011-07-11 NOTE — Assessment & Plan Note (Signed)
We'll on discussion on the importance of the medication somewhat they are for. He's had this discussion with his cardiologist as well. He currently appears to be compensated the best he can. I stressed the importance of his diuretics. I will obtain overdue for labs today including lipid panel.

## 2011-07-12 LAB — COMPREHENSIVE METABOLIC PANEL
ALT: 14 U/L (ref 0–53)
CO2: 25 mEq/L (ref 19–32)
Calcium: 9.2 mg/dL (ref 8.4–10.5)
Chloride: 107 mEq/L (ref 96–112)
Creat: 1.09 mg/dL (ref 0.50–1.35)
Glucose, Bld: 92 mg/dL (ref 70–99)
Total Bilirubin: 0.9 mg/dL (ref 0.3–1.2)

## 2011-07-12 LAB — TSH: TSH: 1.266 u[IU]/mL (ref 0.350–4.500)

## 2011-07-12 LAB — LIPID PANEL
Cholesterol: 205 mg/dL — ABNORMAL HIGH (ref 0–200)
HDL: 31 mg/dL — ABNORMAL LOW (ref >39)
LDL Cholesterol: 139 mg/dL — ABNORMAL HIGH (ref 0–99)
Triglycerides: 175 mg/dL — ABNORMAL HIGH (ref <150)
VLDL: 35 mg/dL (ref 0–40)

## 2011-07-12 LAB — CBC
HCT: 44.2 % (ref 39.0–52.0)
Hemoglobin: 15.5 g/dL (ref 13.0–17.0)
MCHC: 35.1 g/dL (ref 30.0–36.0)
RDW: 14.3 % (ref 11.5–15.5)
WBC: 7.5 10*3/uL (ref 4.0–10.5)

## 2011-07-14 LAB — TESTOSTERONE, FREE, TOTAL, SHBG
Testosterone, Free: 81.9 pg/mL (ref 47.0–244.0)
Testosterone-% Free: 2 % (ref 1.6–2.9)

## 2011-07-18 ENCOUNTER — Other Ambulatory Visit: Payer: Self-pay | Admitting: Family Medicine

## 2011-07-18 DIAGNOSIS — F329 Major depressive disorder, single episode, unspecified: Secondary | ICD-10-CM

## 2011-07-18 DIAGNOSIS — F419 Anxiety disorder, unspecified: Secondary | ICD-10-CM

## 2011-07-18 DIAGNOSIS — F32A Depression, unspecified: Secondary | ICD-10-CM

## 2011-07-18 DIAGNOSIS — F410 Panic disorder [episodic paroxysmal anxiety] without agoraphobia: Secondary | ICD-10-CM

## 2011-08-08 ENCOUNTER — Ambulatory Visit (INDEPENDENT_AMBULATORY_CARE_PROVIDER_SITE_OTHER): Payer: Medicare Other | Admitting: Family Medicine

## 2011-08-08 ENCOUNTER — Encounter: Payer: Self-pay | Admitting: Family Medicine

## 2011-08-08 DIAGNOSIS — F411 Generalized anxiety disorder: Secondary | ICD-10-CM

## 2011-08-08 DIAGNOSIS — G43909 Migraine, unspecified, not intractable, without status migrainosus: Secondary | ICD-10-CM

## 2011-08-08 DIAGNOSIS — F419 Anxiety disorder, unspecified: Secondary | ICD-10-CM

## 2011-08-08 DIAGNOSIS — F329 Major depressive disorder, single episode, unspecified: Secondary | ICD-10-CM

## 2011-08-08 MED ORDER — CLONAZEPAM 1 MG PO TABS: ORAL_TABLET | ORAL | Status: DC

## 2011-08-08 MED ORDER — BUTALBITAL-APAP-CAFFEINE 50-325-40 MG PO TABS: ORAL_TABLET | ORAL | Status: DC

## 2011-08-08 MED ORDER — PRAVASTATIN SODIUM 40 MG PO TABS: 40.0000 mg | ORAL_TABLET | Freq: Every day | ORAL | Status: DC

## 2011-08-08 NOTE — Progress Notes (Signed)
  Subjective:    Patient ID: [name], male    DOB: [birth year], 45 y.o.   MRN: [id number]  HPI  Migraine- has history of migraine, has a severe left sided headed, took CVS brand of migraine headache, feels a little disoriented, occ photophobia, no nasuea, no change in vision, has not had a migraine in 2 weeks, when he gets them they can last a few days, no specific aura  OSA- using BIpap but still feels sleepy  Stress- a lot of stress with his son's mother, had court appearance last week, wants to get full custody  Depression- stopped taking Celexa secondary to feeling cloudy and afraid to take the medication, has has SI in the past on this medication, last  Week he has some harmful thoughts, no plan, he is sure it was the medication, he has not heard back from psych,He feels his mood, concerns of Erectile dysfunction and cloudiness is all atributed to taking anti-depressants and at this time does not want to take anything else. He does feel his klonopin helps some .  Has not tried Trazadone  Review of Systems  Per above   GEN- no fever, no change in vision   CVS- no CP, Palpitations   Nuero- no weakness    Psych- denies current SI, denies hallucinations    Objective:   Physical Exam  GEN- NAD, alert and oriented x3, morbidly obese HEENT- PERRL, EOMI,  MMM, oropharynx clear, fundoscopic exam benign Neck- Supple,  CVS- RRR, no murmur Neuro- CNII-XII grossly in tact, no focal neurological deficits Psych- flat affect, depressed appearing, not anxious appearing, normal speech, no apparent hallucinations        Assessment & Plan:

## 2011-08-08 NOTE — Patient Instructions (Addendum)
Continue your current  Heart medications Continue your Klonopin Try the Fiorocet for your headaches Start the cholesterol pill F/U after you see the psychiatrist

## 2011-08-10 ENCOUNTER — Encounter: Payer: Self-pay | Admitting: Family Medicine

## 2011-08-10 DIAGNOSIS — G43909 Migraine, unspecified, not intractable, without status migrainosus: Secondary | ICD-10-CM | POA: Insufficient documentation

## 2011-08-10 NOTE — Assessment & Plan Note (Addendum)
Trial of Fiorect, currently on beta blocker, would not start topamax or TCA in him because of above with mental illness. Would prefer to avoid NSAIDS with CHF history Stress is a major trigger

## 2011-08-10 NOTE — Assessment & Plan Note (Signed)
Refilled meds,, f/u psych

## 2011-08-10 NOTE — Assessment & Plan Note (Signed)
Pt denies SI today, He needs some anti-depressant medication, with his fear of meds and history of multiple meds from even his childhood, I will await his visit with his psychiatrist and not start any new meds. Refilled the Klonopin.

## 2011-08-22 LAB — BASIC METABOLIC PANEL
BUN: 11
CO2: 29
Chloride: 105
Creatinine, Ser: 1.13
Glucose, Bld: 92
Potassium: 3.3 — ABNORMAL LOW

## 2011-08-22 LAB — CBC
HCT: 44.3
MCHC: 34.6
MCV: 83.3
Platelets: 295
RDW: 15.3
WBC: 11.1 — ABNORMAL HIGH

## 2011-08-22 LAB — DIFFERENTIAL
Basophils Absolute: 0.1
Basophils Relative: 1
Eosinophils Absolute: 0.2
Eosinophils Relative: 1
Neutrophils Relative %: 49

## 2011-08-22 LAB — RAPID URINE DRUG SCREEN, HOSP PERFORMED
Amphetamines: NOT DETECTED
Barbiturates: NOT DETECTED

## 2011-10-06 ENCOUNTER — Telehealth: Payer: Self-pay | Admitting: Internal Medicine

## 2011-10-10 ENCOUNTER — Telehealth: Payer: Self-pay | Admitting: Family Medicine

## 2011-10-17 NOTE — Telephone Encounter (Signed)
pls sched appt in office one day next week as a work in, he needs to bring his meds

## 2011-10-17 NOTE — Telephone Encounter (Signed)
Had a bad rash aon his bottom, legs and abdomen. He went to ER and they gave him steroids and itch pill and not helping, has been scratching so bad he is bleeding. He got a refill of klonopin and it was a different color and doesn't know if that had anything to do with it. Prednisone didn't help. Has been having bad heartburn also. Eczema runs in his family but he doesn't think that's what it is. He needs something done now because he said it looks like some kind of disease. Has been under a lot of stress. Please call him asap. Has been going on longer than 2 weeks

## 2011-10-17 NOTE — Telephone Encounter (Signed)
Has appt on Monday °

## 2011-10-17 NOTE — Telephone Encounter (Signed)
Luann to schedule  

## 2011-10-17 NOTE — Telephone Encounter (Signed)
Appointment  11.19.12 @ 9:45

## 2011-10-20 ENCOUNTER — Encounter: Payer: Self-pay | Admitting: Family Medicine

## 2011-10-20 ENCOUNTER — Ambulatory Visit (INDEPENDENT_AMBULATORY_CARE_PROVIDER_SITE_OTHER): Payer: Medicare Other | Admitting: Family Medicine

## 2011-10-20 VITALS — BP 140/80 | HR 70 | Resp 16 | Ht 68.0 in | Wt >= 6400 oz

## 2011-10-20 DIAGNOSIS — I10 Essential (primary) hypertension: Secondary | ICD-10-CM

## 2011-10-20 DIAGNOSIS — Z23 Encounter for immunization: Secondary | ICD-10-CM

## 2011-10-20 DIAGNOSIS — F419 Anxiety disorder, unspecified: Secondary | ICD-10-CM

## 2011-10-20 DIAGNOSIS — F411 Generalized anxiety disorder: Secondary | ICD-10-CM

## 2011-10-20 DIAGNOSIS — B369 Superficial mycosis, unspecified: Secondary | ICD-10-CM

## 2011-10-20 DIAGNOSIS — E669 Obesity, unspecified: Secondary | ICD-10-CM

## 2011-10-20 DIAGNOSIS — R7301 Impaired fasting glucose: Secondary | ICD-10-CM

## 2011-10-20 LAB — HEMOGLOBIN A1C
Hgb A1c MFr Bld: 6 % — ABNORMAL HIGH (ref <5.7)
Mean Plasma Glucose: 126 mg/dL — ABNORMAL HIGH (ref <117)

## 2011-10-20 MED ORDER — CLONAZEPAM 1 MG PO TABS: ORAL_TABLET | ORAL | Status: DC

## 2011-10-20 MED ORDER — KETOCONAZOLE 2 % EX SHAM: MEDICATED_SHAMPOO | CUTANEOUS | Status: DC

## 2011-10-20 MED ORDER — CEPHALEXIN 250 MG PO CAPS: 250.0000 mg | ORAL_CAPSULE | Freq: Three times a day (TID) | ORAL | Status: AC

## 2011-10-20 MED ORDER — KETOCONAZOLE 200 MG PO TABS: 200.0000 mg | ORAL_TABLET | Freq: Every day | ORAL | Status: DC

## 2011-10-20 MED ORDER — INFLUENZA VAC TYPES A & B PF IM SUSP: 0.5000 mL | Freq: Once | INTRAMUSCULAR | Status: DC

## 2011-10-20 NOTE — Assessment & Plan Note (Signed)
Unchanged. Patient re-educated about  the importance of commitment to a  minimum of 150 minutes of exercise per week. The importance of healthy food choices with portion control discussed. Encouraged to start a food diary, count calories and to consider  joining a support group. Sample diet sheets offered. Goals set by the patient for the next several months.    

## 2011-10-20 NOTE — Assessment & Plan Note (Signed)
Increased anxiety , due to social situation, medication as before

## 2011-10-20 NOTE — Patient Instructions (Addendum)
F/U with Dr Jeanice Lim in 3 months.  Hba1C today.  TdaP and flu vaccines today  You are being referred to dermatology regarding the rash.   Med sent in shampoo and 2 different tablets for therash.  Please use moisturizing lotion on your skin, eg cetaphil

## 2011-10-20 NOTE — Assessment & Plan Note (Signed)
Controlled, no change in medication  

## 2011-10-20 NOTE — Progress Notes (Signed)
  Subjective:    Patient ID: [name], male    DOB: 07/21/1966, 45 y.o.   MRN: [id number]  HPI 6 week h/o dry pruritic rash involving trunk and extremities, which is worsening. Pt was seen in the Ed 10/07, had steroids and ant itch pills situation has worsened. Thinks it may be due to the new klonopin he has, though this does not look at all like an allergic reaction. Denies fever , chills or purulent drainage form the rash, though high risk f bacterial superinfection due to excessive scratching. C/o excessive stress, trying to locate his 6 year old son who was last seen with the child's mother several weeks ago who has been missing ever since    Review of Systems See HPI Denies recent fever or chills. Denies sinus pressure, nasal congestion, ear pain or sore throat. Denies chest congestion, productive cough or wheezing. Denies chest pains, palpitations and leg swelling Denies abdominal pain, nausea, vomiting,diarrhea or constipation.   Denies dysuria, frequency, hesitancy or incontinence. Denies joint pain, swelling and limitation in mobility. Denies headaches, seizures, numbness, or tingling.     Objective:   Physical Exam  Patient alert and oriented and in no cardiopulmonary distress.  HEENT: No facial asymmetry, EOMI, no sinus tenderness,  oropharynx pink and moist.  Neck supple no adenopathy.  Chest: Clear to auscultation bilaterally.  CVS: S1, S2 no murmurs, no S3.  ABD: Soft non tender. Bowel sounds normal.  Ext: No edema  MS: Adequate ROM spine, shoulders, hips and knees.  Skin: Skin is dry,multiple scratch marks involving trunk and extremities  Psych: Good eye contact, normal affect. Memory intact, anxious not  depressed appearing.  CNS: CN 2-12 intact, power, tone and sensation normal throughout.       Assessment & Plan:

## 2011-12-08 ENCOUNTER — Other Ambulatory Visit: Payer: Self-pay | Admitting: Family Medicine

## 2011-12-09 ENCOUNTER — Ambulatory Visit: Payer: Medicare Other | Admitting: Cardiology

## 2011-12-18 ENCOUNTER — Encounter: Payer: Self-pay | Admitting: Family Medicine

## 2011-12-18 ENCOUNTER — Telehealth: Payer: Self-pay | Admitting: Family Medicine

## 2011-12-18 ENCOUNTER — Ambulatory Visit (INDEPENDENT_AMBULATORY_CARE_PROVIDER_SITE_OTHER): Payer: Medicare Other | Admitting: Family Medicine

## 2011-12-18 VITALS — BP 132/74 | HR 78 | Resp 18 | Ht 68.0 in | Wt >= 6400 oz

## 2011-12-18 DIAGNOSIS — R21 Rash and other nonspecific skin eruption: Secondary | ICD-10-CM | POA: Diagnosis not present

## 2011-12-18 DIAGNOSIS — R609 Edema, unspecified: Secondary | ICD-10-CM

## 2011-12-18 DIAGNOSIS — R600 Localized edema: Secondary | ICD-10-CM

## 2011-12-18 DIAGNOSIS — I509 Heart failure, unspecified: Secondary | ICD-10-CM

## 2011-12-18 DIAGNOSIS — I10 Essential (primary) hypertension: Secondary | ICD-10-CM

## 2011-12-18 DIAGNOSIS — I5022 Chronic systolic (congestive) heart failure: Secondary | ICD-10-CM

## 2011-12-18 DIAGNOSIS — F411 Generalized anxiety disorder: Secondary | ICD-10-CM

## 2011-12-18 DIAGNOSIS — F419 Anxiety disorder, unspecified: Secondary | ICD-10-CM

## 2011-12-18 DIAGNOSIS — F329 Major depressive disorder, single episode, unspecified: Secondary | ICD-10-CM | POA: Diagnosis not present

## 2011-12-18 DIAGNOSIS — G4730 Sleep apnea, unspecified: Secondary | ICD-10-CM

## 2011-12-18 DIAGNOSIS — L42 Pityriasis rosea: Secondary | ICD-10-CM | POA: Diagnosis not present

## 2011-12-18 MED ORDER — POTASSIUM CHLORIDE ER 10 MEQ PO CPCR: 10.0000 meq | ORAL_CAPSULE | Freq: Two times a day (BID) | ORAL | Status: DC

## 2011-12-18 MED ORDER — SULFAMETHOXAZOLE-TRIMETHOPRIM 800-160 MG PO TABS: 1.0000 | ORAL_TABLET | Freq: Two times a day (BID) | ORAL | Status: AC

## 2011-12-18 NOTE — Patient Instructions (Addendum)
For your leg swelling increase your Demadex to 20mg  twice a day Continue the spironolactone  For your skin- take the antibiotics as prescribed  Restart the celexa at 20mg  daily  Call the dermatologist back for a recheck  Take potassium pills  F/U in 1 week I will send a new prescription to [facility] for your sleep apnea machine

## 2011-12-18 NOTE — Telephone Encounter (Signed)
Noted  

## 2011-12-21 ENCOUNTER — Encounter: Payer: Self-pay | Admitting: Family Medicine

## 2011-12-21 DIAGNOSIS — R600 Localized edema: Secondary | ICD-10-CM | POA: Insufficient documentation

## 2011-12-21 NOTE — Assessment & Plan Note (Signed)
I am concerned about the pustular lesions and the erythema surrounding the biopsy site, will start Bactrim to cover MRSA, he will schedule f/u with dermatology

## 2011-12-21 NOTE — Progress Notes (Signed)
  Subjective:    Patient ID: [name], male    DOB: [birth year], 46 y.o.   MRN: [id number]  HPI  Pt here to f/u on his chronic medical problems, he is concerned about a rash that has been persistent for the past few weeks.  Rash- seen by Derm, treated with antibiotics and antifungals, prior to derm appt, biopsy done, path reveals non specific cells contact dermatitis vs eczematous rash, he is not sure what the dermatologist told him, using TAC 1% cream which has not helped a lot, seen in ED 2 weeks ago treated for scabies, rash over trunk and lower ext. Denies fever, peeling skin, muscle pain  Depression/anxiety- severe depression, his child is now on the missing persons list as he ex-wife took off with him after he recently gained custody, he knows he needs meds but fearful of taking them, has not heard back from psych and he did not call to let us know. Uses Klonopin  HTN- tolearting BP meds, no concerns, occ CP, difficulty breathing,  OSA- feels mask not helping very much, he is to have it serviced, continues to smoke  CHF- lower ext edema worse than normal, he does not take his Demadex on a regular basis  ABd pain a few days ago, no change in bowel or bladder, no blood in stool, no change with food, denies N/V  Review of Systems    GEN- +fatigue, fever, weight loss,weakness, recent illness HEENT- denies eye drainage, change in vision, nasal discharge, CVS- Occ chest pain, palpitations RESP- + SOB, denies cough, wheeze ABD- denies N/V, change in stools,+ abd pain GU- denies dysuria, hematuria, dribbling, incontinence MSK- denies joint pain, muscle aches, injury Neuro- denies headache, dizziness, syncope, seizure activity      Objective:   Physical Exam  GEN- NAD, alert and oriented x3, morbidly obese HEENT- PERRL, EOMI,  MMM, oropharynx clear,  Neck- Supple,  CVS- RRR, no murmur ABD- NABS, Soft, NT, no apparent distension, large pannus Skin- multiple dry patchy regions,  eczematous rash on back, scattered pustular lesions on sides, biopsy site with hyperpigmented keloid, open scab with surrounding erythema approx 0.5cm, no fluctuance, no pus expressed Psych- flat affect, depressed appearing, not anxious appearing, normal speech, no apparent hallucinations  Ext- 2+ pitting edema         Assessment & Plan:

## 2011-12-21 NOTE — Assessment & Plan Note (Signed)
Mood continues to deteriorate in setting of recent events with his son Pt agreed to restart Celexa at 20mg , he is very fearful of psych meds and has been on a couple in the past Refer to psych again, I am not sure what has happened with his referral.

## 2011-12-21 NOTE — Assessment & Plan Note (Addendum)
Continue use of BiPAP for OSA therapy,Will send a new script as his machine is out of date and he will continue to benefit from its use. His blood pressure is well controlled

## 2011-12-21 NOTE — Assessment & Plan Note (Signed)
Pt to take diuretics as prescribed

## 2011-12-21 NOTE — Assessment & Plan Note (Signed)
Increase Demadex to twice a day, add potassium, recheck in 1 week, labs to be done

## 2011-12-21 NOTE — Assessment & Plan Note (Signed)
Well controlled, his is to reschedule with Cardiology

## 2011-12-21 NOTE — Assessment & Plan Note (Signed)
Refer to psych, continue Klonopin prn

## 2011-12-21 NOTE — Assessment & Plan Note (Signed)
He continues to gain weight, this is worsened by his depression. We discussed changing diet, exercise which may help his mood as well, he has very critical medical problems for his young age

## 2011-12-22 ENCOUNTER — Telehealth: Payer: Self-pay

## 2011-12-22 NOTE — Telephone Encounter (Signed)
Said that the way you worded the notes (Encourages him to use his bipap) sounds like he hasn't been using it and insurance won't pay. Said to reword it to say He continues to use bipap and benefits from it and refax.

## 2011-12-23 NOTE — Telephone Encounter (Signed)
Noted  

## 2011-12-25 ENCOUNTER — Ambulatory Visit: Payer: Medicare Other | Admitting: Family Medicine

## 2011-12-30 ENCOUNTER — Other Ambulatory Visit: Payer: Self-pay | Admitting: Family Medicine

## 2011-12-30 ENCOUNTER — Ambulatory Visit: Payer: Medicare Other | Admitting: Family Medicine

## 2011-12-30 DIAGNOSIS — I10 Essential (primary) hypertension: Secondary | ICD-10-CM | POA: Diagnosis not present

## 2011-12-30 DIAGNOSIS — R7309 Other abnormal glucose: Secondary | ICD-10-CM | POA: Diagnosis not present

## 2011-12-30 LAB — COMPREHENSIVE METABOLIC PANEL
ALT: 21 U/L (ref 0–53)
AST: 14 U/L (ref 0–37)
BUN: 15 mg/dL (ref 6–23)
Calcium: 9.5 mg/dL (ref 8.4–10.5)
Chloride: 106 mEq/L (ref 96–112)
Creat: 1.03 mg/dL (ref 0.50–1.35)
Total Bilirubin: 0.6 mg/dL (ref 0.3–1.2)

## 2011-12-31 LAB — CBC
HCT: 41.4 % (ref 39.0–52.0)
Hemoglobin: 14.4 g/dL (ref 13.0–17.0)
MCHC: 34.8 g/dL (ref 30.0–36.0)
MCV: 82.1 fL (ref 78.0–100.0)
RDW: 13.9 % (ref 11.5–15.5)

## 2012-01-02 ENCOUNTER — Ambulatory Visit (INDEPENDENT_AMBULATORY_CARE_PROVIDER_SITE_OTHER): Payer: Medicare Other | Admitting: Family Medicine

## 2012-01-02 ENCOUNTER — Encounter: Payer: Self-pay | Admitting: Family Medicine

## 2012-01-02 DIAGNOSIS — F329 Major depressive disorder, single episode, unspecified: Secondary | ICD-10-CM | POA: Diagnosis not present

## 2012-01-02 DIAGNOSIS — I5022 Chronic systolic (congestive) heart failure: Secondary | ICD-10-CM

## 2012-01-02 DIAGNOSIS — R079 Chest pain, unspecified: Secondary | ICD-10-CM | POA: Diagnosis not present

## 2012-01-02 DIAGNOSIS — K219 Gastro-esophageal reflux disease without esophagitis: Secondary | ICD-10-CM | POA: Diagnosis not present

## 2012-01-02 DIAGNOSIS — F411 Generalized anxiety disorder: Secondary | ICD-10-CM

## 2012-01-02 DIAGNOSIS — R600 Localized edema: Secondary | ICD-10-CM

## 2012-01-02 DIAGNOSIS — R609 Edema, unspecified: Secondary | ICD-10-CM

## 2012-01-02 DIAGNOSIS — I10 Essential (primary) hypertension: Secondary | ICD-10-CM

## 2012-01-02 DIAGNOSIS — R7309 Other abnormal glucose: Secondary | ICD-10-CM

## 2012-01-02 DIAGNOSIS — R7303 Prediabetes: Secondary | ICD-10-CM

## 2012-01-02 DIAGNOSIS — G4700 Insomnia, unspecified: Secondary | ICD-10-CM

## 2012-01-02 DIAGNOSIS — I509 Heart failure, unspecified: Secondary | ICD-10-CM

## 2012-01-02 LAB — HEMOGLOBIN A1C: Mean Plasma Glucose: 140 mg/dL — ABNORMAL HIGH (ref <117)

## 2012-01-02 MED ORDER — SERTRALINE HCL 50 MG PO TABS: 50.0000 mg | ORAL_TABLET | Freq: Every day | ORAL | Status: DC

## 2012-01-02 MED ORDER — LORAZEPAM 1 MG PO TABS: 1.0000 mg | ORAL_TABLET | Freq: Three times a day (TID) | ORAL | Status: DC | PRN

## 2012-01-02 NOTE — Assessment & Plan Note (Signed)
Unchanged. Patient has not started the trazodone as previously prescribed. At this time we will hold off since we are switching to Zoloft.

## 2012-01-02 NOTE — Assessment & Plan Note (Signed)
Blood pressure at goal

## 2012-01-02 NOTE — Patient Instructions (Addendum)
Continue your blood pressure medications Increase your protonix to 40mg  twice a day if needed for acid reflux I will call about your diabetes lab Start the zoloft, take 50mg  at bedtime x 1 week, then increase to 100mg   I will change the Klonopin to lorazepam Your EKG was okay F/U in 2 weeks

## 2012-01-02 NOTE — Assessment & Plan Note (Signed)
Encouraged walking 

## 2012-01-02 NOTE — Progress Notes (Signed)
  Subjective:    Patient ID: [name], male    DOB: [birth year], 46 y.o.   MRN: [id number]  HPI Patient presents to followup his mood as well as lab results. His depression continues to worsen. He has many complaints. He states he has chest pain, difficulty breathing and tightness in the chest at different times during the day. He does not feel his CPAP machine works well ago he has a new one has already been ordered. He has abdominal pain he has increased heartburn. He has leg swelling that has improved however he does not want to continue taking the diuretic. He gets headaches and he is not sleeping well. He states he's not able to afford psychiatry at this time.   Review of Systems  GEN- +fatigue, fever, weight loss,weakness, recent illness HEENT- denies eye drainage, change in vision, nasal discharge, CVS- Occ chest pain, palpitations RESP- + SOB, denies cough, wheeze ABD- denies N/V, change in stools,+ abd pain GU- denies dysuria, hematuria, dribbling, incontinence MSK- denies joint pain, muscle aches, injury Neuro- denies headache, dizziness, syncope, seizure activ      Objective:   Physical Exam GEN- NAD, alert and oriented x3, morbidly obese HEENT- PERRL, EOMI,  MMM, oropharynx clear,  Neck- Supple,  CVS- RRR, no murmur Psych- flat affect, depressed appearing, not anxious appearing, normal speech, no apparent hallucinations  Ext- 1+ pitting edema   EKG- NSR,LVH, non specific T waves, no change from June 2012 comparison     Assessment & Plan:

## 2012-01-02 NOTE — Assessment & Plan Note (Signed)
Increase protonix. to twice a day as needed.

## 2012-01-02 NOTE — Assessment & Plan Note (Signed)
His chest pain is atypical, although he is high risk for CAD, he is compensated regarding his CHF, his has high anxiety and depression as well as increased GI symptoms EKG reassuring

## 2012-01-02 NOTE — Assessment & Plan Note (Signed)
Obtain repeat A1c. I'm concerned that his complaints of polydipsia, weight gain may be associated with a conversion to diabetes.

## 2012-01-02 NOTE — Assessment & Plan Note (Signed)
He is currently compensated although he complains of some chest discomfort. His EKG is unchanged.

## 2012-01-02 NOTE — Assessment & Plan Note (Signed)
[name] has severe depression- in setting of recent events with his son.Marland Kitchen He continues to deteriorate and I think this is overall effectiveness health. He continues to gain weight and has many somatic complaints. He states he is unable to afford to go to a psychiatrist at another location as he has not been accepted locally. This situation is very difficult as he is averse to taking medications. At this time I would change him to Zoloft 50 mg and increase to 100 mg daily. His Klonopin will be changed to Ativan if he feels this does not work for him. He will followup in 2 weeks

## 2012-01-02 NOTE — Assessment & Plan Note (Signed)
Discontinue Klonopin, start Ativan 3 times a day as needed

## 2012-01-02 NOTE — Assessment & Plan Note (Signed)
Leg edema is improved the patient states that the increased dose of demadex makes  in sick therefore he is back to 20 mg daily. He does note that he does not always take the diuretic as prescribed.

## 2012-01-13 ENCOUNTER — Encounter: Payer: Self-pay | Admitting: Family Medicine

## 2012-01-13 NOTE — Progress Notes (Signed)
Patient ID: [name], male   DOB: 30-Mar-1966, 46 y.o.   MRN: [id number] I received a letter from patient insurance. They do not cover lorazepam. The only cover Valium and Klonopin. He will have to change back to Klonopin as his anxiolytic.

## 2012-01-16 ENCOUNTER — Ambulatory Visit: Payer: Medicare Other | Admitting: Family Medicine

## 2012-01-20 ENCOUNTER — Ambulatory Visit (INDEPENDENT_AMBULATORY_CARE_PROVIDER_SITE_OTHER): Payer: Medicare Other | Admitting: Family Medicine

## 2012-01-20 ENCOUNTER — Encounter: Payer: Self-pay | Admitting: Family Medicine

## 2012-01-20 VITALS — BP 130/90 | HR 93 | Resp 16 | Ht 68.0 in | Wt >= 6400 oz

## 2012-01-20 DIAGNOSIS — I5022 Chronic systolic (congestive) heart failure: Secondary | ICD-10-CM

## 2012-01-20 DIAGNOSIS — F329 Major depressive disorder, single episode, unspecified: Secondary | ICD-10-CM

## 2012-01-20 DIAGNOSIS — F411 Generalized anxiety disorder: Secondary | ICD-10-CM

## 2012-01-20 DIAGNOSIS — I509 Heart failure, unspecified: Secondary | ICD-10-CM | POA: Diagnosis not present

## 2012-01-20 NOTE — Patient Instructions (Signed)
For your mood take the Zoloft 50mg  at bedtime and stay at this dose Take 1/2 tablet of your water pill twice a day Take all of your blood pressure medications Walk as much as you can Think hard about your life and what you want to accomplish  Get a pill box F/U with Dr. Natasha Bence  F/U in 1 month

## 2012-01-21 NOTE — Assessment & Plan Note (Signed)
Encouraged walking, discussed heart attack signs Also discussed importance of his medications

## 2012-01-21 NOTE — Assessment & Plan Note (Signed)
We had a long discussion > 15 minutes about his mental health, he believes things are better. Getting his son has improved his mood and overall outlook based on today's discussion. I hope he continues on a path to improve his health both mentally and physically. He will restart zoloft and stay at the low dose for now.

## 2012-01-21 NOTE — Assessment & Plan Note (Signed)
He will have to restart klonopin, he seems to go back in forth about what meds help, I wonder if he is taking his meds at all

## 2012-01-21 NOTE — Progress Notes (Signed)
  Subjective:    Patient ID: [name], male    DOB: 1966/03/28, 46 y.o.   MRN: [id number]  HPI Pt here to f/u depression and anxiety. He is in good spirits today as he found his son in [place] and now has custody. He only took the zoloft for a few days and did not titrate up. His insurance would not cover the change to Ativan. He states he wants to do better and get his life under control now that he has his son, he knows he needs medications but has fear of taking them.  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   Obesity-  He wants to try to loose weight, he has his son who is also overweight and daughter.      Review of Systems     Objective:   Physical Exam GEN- NAD, alert and oriented x 3, obese Psych- Happy and smiling today, but still has depressed affect underlying, not anxious appearing, normal speech, no apparent hallucinations        Assessment & Plan:

## 2012-01-21 NOTE — Assessment & Plan Note (Signed)
We discussed again today, his need to start walking and change his diet, both of his children are obese and this can be a family.

## 2012-02-07 ENCOUNTER — Other Ambulatory Visit: Payer: Self-pay | Admitting: Family Medicine

## 2012-02-17 ENCOUNTER — Ambulatory Visit (INDEPENDENT_AMBULATORY_CARE_PROVIDER_SITE_OTHER): Payer: Medicare Other | Admitting: Family Medicine

## 2012-02-17 ENCOUNTER — Encounter: Payer: Self-pay | Admitting: Family Medicine

## 2012-02-17 ENCOUNTER — Ambulatory Visit: Payer: Medicare Other | Admitting: Family Medicine

## 2012-02-17 VITALS — BP 120/80 | HR 70 | Resp 16 | Ht 68.0 in | Wt >= 6400 oz

## 2012-02-17 DIAGNOSIS — I10 Essential (primary) hypertension: Secondary | ICD-10-CM

## 2012-02-17 DIAGNOSIS — F17200 Nicotine dependence, unspecified, uncomplicated: Secondary | ICD-10-CM

## 2012-02-17 DIAGNOSIS — F411 Generalized anxiety disorder: Secondary | ICD-10-CM

## 2012-02-17 DIAGNOSIS — R0602 Shortness of breath: Secondary | ICD-10-CM

## 2012-02-17 DIAGNOSIS — F329 Major depressive disorder, single episode, unspecified: Secondary | ICD-10-CM

## 2012-02-17 DIAGNOSIS — I5022 Chronic systolic (congestive) heart failure: Secondary | ICD-10-CM

## 2012-02-17 DIAGNOSIS — Z720 Tobacco use: Secondary | ICD-10-CM

## 2012-02-17 DIAGNOSIS — I509 Heart failure, unspecified: Secondary | ICD-10-CM

## 2012-02-17 DIAGNOSIS — R109 Unspecified abdominal pain: Secondary | ICD-10-CM

## 2012-02-17 MED ORDER — CLONAZEPAM 1 MG PO TABS: 1.0000 mg | ORAL_TABLET | Freq: Three times a day (TID) | ORAL | Status: DC | PRN

## 2012-02-17 NOTE — Patient Instructions (Signed)
I will set you up for a CT scan and Chest x-ray I will refill Klonopin today Get the labs done before Friday  I will send a letter for the [facility] F/U 2 months

## 2012-02-18 ENCOUNTER — Telehealth: Payer: Self-pay | Admitting: Family Medicine

## 2012-02-18 DIAGNOSIS — R0602 Shortness of breath: Secondary | ICD-10-CM | POA: Insufficient documentation

## 2012-02-18 DIAGNOSIS — Z720 Tobacco use: Secondary | ICD-10-CM | POA: Insufficient documentation

## 2012-02-18 NOTE — Assessment & Plan Note (Signed)
Unchanged, at this time I will not attempt any new meds, he continues to have excuses for why he can not see psychiatry. He can use the klonopin at least for his panic attacks

## 2012-02-18 NOTE — Assessment & Plan Note (Signed)
He continues to wax and wane with symptoms, see above regarding anxiety,no change to meds

## 2012-02-18 NOTE — Assessment & Plan Note (Signed)
He has been using his diruretics, his weight is improving, will obtain labs, he has f/u with cardiology on Friday, I will obtain a CXR for the SOB but he looks compensated today.

## 2012-02-18 NOTE — Telephone Encounter (Signed)
Pt aware.

## 2012-02-18 NOTE — Assessment & Plan Note (Signed)
He is very difficult to exam because of habitus, will obtain CT abdomen/pelvis as he continue to complain. Will obtain lipase and H pylori as well because of epigastric pain though he is on PPI

## 2012-02-18 NOTE — Progress Notes (Signed)
  Subjective:    Patient ID: [name], male    DOB: 1966/11/30, 46 y.o.   MRN: [id number]  HPI  Abdominal pain- he has persistent abdominal pain in upper parts that sometimes moves all over, he also has bloating and heartburn. He is taking Protonix. Had loose stools a few times, no blood in stools. He also continues to catch cramps in his abdomen and legs  SOB- continues to have SOB, with little exertion, but wants to go back to cardiac rehab, has an appt with his cardiologist this Friday. +smoker  Depression- He stopped the zoloft and is only taking klonopin which he states helps with his panic attacks but not his anxiety, he doesn't think any meds work good for him. His father passed away last week, but feels okay, because he was surrounded with family   Review of Systems    GEN- +fatigue, fever,+ weight loss,weakness, recent illness HEENT- denies eye drainage, change in vision, nasal discharge, CVS- Occ chest pain, palpitations RESP- + SOB, denies cough, wheeze ABD- denies N/V, change in stools,+ abd pain GU- denies dysuria, hematuria, dribbling, incontinence MSK- denies joint pain, muscle aches, injury Neuro- denies headache, dizziness, syncope, seizure activ     Objective:   Physical Exam GEN- NAD, alert and oriented x3, morbidly obese weight down 5lbs HEENT- PERRL, EOMI,  MMM, oropharynx clear,  Neck- Supple,  CVS- RRR, no murmur RESP- CTAB ABD-NABS,large pannis, TTP RUQ, epigastrium, no rebound,no guarding, no CVA tenderness Psych- flat affect, depressed appearing,  Ext- 1+ pitting edema       Assessment & Plan:

## 2012-02-18 NOTE — Assessment & Plan Note (Signed)
Multifactorial- I believe, will obtain CXR

## 2012-02-18 NOTE — Telephone Encounter (Signed)
He can hold the norvasc today, then take tomorrow.

## 2012-02-20 ENCOUNTER — Encounter: Payer: Self-pay | Admitting: Cardiology

## 2012-02-20 ENCOUNTER — Telehealth: Payer: Self-pay | Admitting: Family Medicine

## 2012-02-20 ENCOUNTER — Ambulatory Visit (INDEPENDENT_AMBULATORY_CARE_PROVIDER_SITE_OTHER): Payer: Medicare Other | Admitting: Cardiology

## 2012-02-20 VITALS — BP 149/94 | HR 75 | Ht 68.0 in | Wt >= 6400 oz

## 2012-02-20 DIAGNOSIS — I509 Heart failure, unspecified: Secondary | ICD-10-CM

## 2012-02-20 DIAGNOSIS — F411 Generalized anxiety disorder: Secondary | ICD-10-CM

## 2012-02-20 DIAGNOSIS — I10 Essential (primary) hypertension: Secondary | ICD-10-CM | POA: Diagnosis not present

## 2012-02-20 DIAGNOSIS — I4891 Unspecified atrial fibrillation: Secondary | ICD-10-CM | POA: Diagnosis not present

## 2012-02-20 DIAGNOSIS — I5022 Chronic systolic (congestive) heart failure: Secondary | ICD-10-CM

## 2012-02-20 MED ORDER — AMLODIPINE BESYLATE 10 MG PO TABS: 10.0000 mg | ORAL_TABLET | Freq: Every day | ORAL | Status: DC

## 2012-02-20 NOTE — Patient Instructions (Signed)
Your physician wants you to follow-up in: 6 months. You will receive a reminder letter in the mail one-two months in advance. If you don't receive a letter, please call our office to schedule the follow-up appointment. Your physician recommends that you continue on your current medications as directed. Please refer to the Current Medication list given to you today. (Hold your amlodpine when top number of BP (SBP) </= 110.) Cardiac Rehab at [facility] will contact you.

## 2012-02-20 NOTE — Telephone Encounter (Signed)
FYI

## 2012-02-21 ENCOUNTER — Other Ambulatory Visit: Payer: Self-pay | Admitting: Family Medicine

## 2012-02-25 ENCOUNTER — Ambulatory Visit (HOSPITAL_COMMUNITY)
Admission: RE | Admit: 2012-02-25 | Discharge: 2012-02-25 | Disposition: A | Discharge status: Home / Self Care | Payer: Medicare Other | Source: Ambulatory Visit | Attending: Family Medicine | Admitting: Family Medicine

## 2012-02-25 ENCOUNTER — Other Ambulatory Visit (HOSPITAL_COMMUNITY): Payer: Medicare Other

## 2012-02-25 DIAGNOSIS — R109 Unspecified abdominal pain: Secondary | ICD-10-CM | POA: Diagnosis not present

## 2012-02-25 DIAGNOSIS — I10 Essential (primary) hypertension: Secondary | ICD-10-CM | POA: Diagnosis not present

## 2012-02-26 ENCOUNTER — Telehealth: Payer: Self-pay | Admitting: Cardiology

## 2012-02-26 LAB — COMPREHENSIVE METABOLIC PANEL
ALT: 17 U/L (ref 0–53)
CO2: 26 mEq/L (ref 19–32)
Calcium: 9.1 mg/dL (ref 8.4–10.5)
Chloride: 108 mEq/L (ref 96–112)
Glucose, Bld: 126 mg/dL — ABNORMAL HIGH (ref 70–99)
Sodium: 141 mEq/L (ref 135–145)
Total Protein: 6.7 g/dL (ref 6.0–8.3)

## 2012-02-26 LAB — H. PYLORI ANTIBODY, IGG: H Pylori IgG: 0.93 {ISR} — ABNORMAL HIGH

## 2012-02-26 LAB — CBC WITH DIFFERENTIAL/PLATELET
Basophils Absolute: 0 10*3/uL (ref 0.0–0.1)
Lymphocytes Relative: 41 % (ref 12–46)
Lymphs Abs: 3.5 10*3/uL (ref 0.7–4.0)
Neutro Abs: 3.8 10*3/uL (ref 1.7–7.7)
Neutrophils Relative %: 45 % (ref 43–77)
Platelets: 249 10*3/uL (ref 150–400)
RBC: 4.97 MIL/uL (ref 4.22–5.81)
RDW: 14 % (ref 11.5–15.5)
WBC: 8.4 10*3/uL (ref 4.0–10.5)

## 2012-02-26 LAB — LIPASE: Lipase: 47 U/L (ref 0–75)

## 2012-02-26 MED ORDER — CLARITHROMYCIN 500 MG PO TABS: 500.0000 mg | ORAL_TABLET | Freq: Two times a day (BID) | ORAL | Status: AC

## 2012-02-26 MED ORDER — AMOXICILLIN 500 MG PO CAPS: 1000.0000 mg | ORAL_CAPSULE | Freq: Two times a day (BID) | ORAL | Status: AC

## 2012-02-26 NOTE — Telephone Encounter (Signed)
Needs pulmonary test sent asap so that he can get into cardiac rehab -srs

## 2012-02-26 NOTE — Progress Notes (Signed)
Addended by: Milinda Antis F on: 02/26/2012 05:25 PM   Modules accepted: Orders

## 2012-03-02 ENCOUNTER — Other Ambulatory Visit: Payer: Self-pay | Admitting: *Deleted

## 2012-03-02 MED ORDER — PANTOPRAZOLE SODIUM 40 MG PO TBEC: 40.0000 mg | DELAYED_RELEASE_TABLET | Freq: Every day | ORAL | Status: DC

## 2012-03-12 ENCOUNTER — Other Ambulatory Visit: Payer: Self-pay | Admitting: Cardiology

## 2012-03-12 NOTE — Telephone Encounter (Signed)
Patient called to check on status of cardiac rehab referral. Patient state's that the order sent to them made mention of sob,therefore requiring additional results needing to be sent before rehab can be started. Nurse advised patient that cardiac rehab would be contacted to try and resolve issue.

## 2012-03-12 NOTE — Telephone Encounter (Signed)
Spoke with patient and informed him that referral sent to rehab weren't covered by insurance. MD would be informed upon arrival to office for advise. Patient verbalized understanding of plan.

## 2012-03-12 NOTE — Telephone Encounter (Signed)
Called Cardiac Rehab and spoke with Chales Abrahams, she stated that original order sent had CHF as diagnosis that would not cover through insurance. A second order was sent for pulmonary rehab, it too did not go through for insurance to cover due to diagnosis. Nurse called patient and left message for him to call office to discuss this.

## 2012-03-22 NOTE — Assessment & Plan Note (Signed)
Continues to be a significant problem.

## 2012-03-22 NOTE — Progress Notes (Signed)
Adam Bottoms, MD, [facility] ABIM Board Certified in Adult Cardiovascular Medicine,Internal Medicine and Critical Care Medicine    CC:   HPI:      PMH: reviewed and listed in Problem List in Electronic Records (and see below) Past Medical History  Diagnosis Date  . Chronic systolic heart failure   . Edema   . Atrial fibrillation   . Obesity, unspecified   . Anxiety state, unspecified   . Psychiatric disorder   . Obstructive sleep apnea   . History of medication noncompliance   . Alcohol abuse   . Shortness of breath   . Migraine    Past Surgical History  Procedure Date  . Testicle surgery   . Cardiac catheterization     Allergies/SH/FHX : available in Electronic Records for review  Allergies  Allergen Reactions  . Ace Inhibitors     REACTION: throat swelling  . Buspirone     dizziness   History   Social History  . Marital Status: Married    Spouse Name: N/A    Number of Children: 3  . Years of Education: N/A   Occupational History  . DISABLED    Social History Main Topics  . Smoking status: Current Everyday Smoker -- 0.5 packs/day for 30 years    Types: Cigarettes  . Smokeless tobacco: Never Used   Comment:    Packs/Day: 1/2 PPD  . Alcohol Use: Yes  . Drug Use: No  . Sexually Active: Not on file   Other Topics Concern  . Not on file   Social History Narrative    He smokes about a pack per day and he has been smoking  since he was 46 years of age.  He drinks alcohol occasionally, but he  denies any illicit drug abuse.  He is presently on disability.      Family History  Problem Relation Age of Onset  . Hypertension      Family History  . Stroke      Family History  . Diabetes      Family History  . Cancer Mother     brain tumor  . Hypertension Mother   . Diabetes Father   . Heart disease Maternal Grandmother     Medications: Current Outpatient Prescriptions  Medication Sig Dispense Refill  . amLODipine (NORVASC) 10 MG tablet Take 1  tablet (10 mg total) by mouth daily. Hold for SBP </= 110  30 tablet  3  . aspirin 81 MG tablet Take 81 mg by mouth daily.        . clonazePAM (KLONOPIN) 1 MG tablet Take 1 tablet (1 mg total) by mouth 3 (three) times daily as needed.  90 tablet  1  . metoprolol succinate (TOPROL-XL) 50 MG 24 hr tablet TAKE 1 TABLET TWICE A DAY  60 tablet  3  . potassium chloride (MICRO-K) 10 MEQ CR capsule Take 10 mEq by mouth as needed.      . torsemide (DEMADEX) 20 MG tablet Take 20 mg by mouth daily.       Marland Kitchen DIOVAN 80 MG tablet TAKE 1 TABLET TWICE A DAY  60 tablet  3  . pantoprazole (PROTONIX) 40 MG tablet Take 1 tablet (40 mg total) by mouth daily.  30 tablet  0  . spironolactone (ALDACTONE) 25 MG tablet TAKE 1 TABLET EVERY DAY  90 tablet  3  . DISCONTD: pravastatin (PRAVACHOL) 40 MG tablet Take 40 mg by mouth daily.      Marland Kitchen  DISCONTD: sertraline (ZOLOFT) 50 MG tablet Take 1 tablet (50 mg total) by mouth daily. Take  50mg  daily for 1 week then increase to 100mg  daily  60 tablet  2   Current Facility-Administered Medications  Medication Dose Route Frequency Provider Last Rate Last Dose  . Influenza (>/= 3 years) inactive virus vaccine (FLVIRIN/FLUZONE) injection SUSP 0.5 mL  0.5 mL Intramuscular Once Kerri Perches, MD        ROS: No nausea or vomiting. No fever or chills.No melena or hematochezia.No bleeding.No claudication  Physical Exam: BP 149/94  Pulse 75  Ht 5\' 8"  (1.727 m)  Wt 446 lb (202.304 kg)  BMI 67.81 kg/m2 General:overweight African American male in no distress Neck:normal carotid upstroke no carotid bruits.  No thyromegaly non-nodular thyroid.  JVP difficult to evaluate Lungs:clear breath sounds bilaterally without wheezing. Cardiac:regular rate and rhythm with normal S1-S2 no murmur or gallops Vascular:1+ pitting edema edema.  Normal distal pulses Skin:warm and dry Physcologic:normal affect  12lead ECG:not obtained Limited bedside ECHO:N/A No images are attached to the  encounter.   Assessment and Plan  UNSPECIFIED ESSENTIAL HYPERTENSION Blood pressure somewhat low at times.  ATRIAL FIBRILLATION, PAROXYSMAL Patient remains in normal sinus rhythm.  Systolic CHF, chronic volume status stable.  I made no changes in his medical regimen.patient will be enrolled in cardiac rehabilitation and be put in order.  ANXIETY STATE, UNSPECIFIED Continues to be a significant problem.    Patient Active Problem List  Diagnoses  . MORBID OBESITY  . ANXIETY STATE, UNSPECIFIED  . UNSPECIFIED ESSENTIAL HYPERTENSION  . ATRIAL FIBRILLATION, PAROXYSMAL  . Systolic CHF, chronic  . ESOPHAGEAL REFLUX  . UNSPECIFIED SLEEP APNEA  . Major depression  . ED (erectile dysfunction)  . Insomnia  . Migraine  . Rash and nonspecific skin eruption  . Bilateral leg edema  . Pre-diabetes  . Chest pain  . Abdominal pain  . SOB (shortness of breath)  . Tobacco user

## 2012-03-22 NOTE — Assessment & Plan Note (Addendum)
volume status stable.  I made no changes in his medical regimen.patient will be enrolled in cardiac rehabilitation and be put in order.

## 2012-03-22 NOTE — Assessment & Plan Note (Signed)
Patient remains in normal sinus rhythm. 

## 2012-03-22 NOTE — Assessment & Plan Note (Signed)
Blood pressure somewhat low at times.

## 2012-03-24 NOTE — Telephone Encounter (Signed)
I am not sure there are any there options.

## 2012-03-25 NOTE — Telephone Encounter (Signed)
[name] is requesting Dr. Andee Lineman to write a note saying it is OK for him to exercise. [name] was told that Medicare will not cover the expense so he is saying that if he has a note he will set up time with [facility] and pay out of pocket.

## 2012-04-12 ENCOUNTER — Telehealth: Payer: Self-pay | Admitting: Family Medicine

## 2012-04-12 ENCOUNTER — Ambulatory Visit (INDEPENDENT_AMBULATORY_CARE_PROVIDER_SITE_OTHER): Payer: Medicare Other | Admitting: Family Medicine

## 2012-04-12 ENCOUNTER — Encounter: Payer: Self-pay | Admitting: Family Medicine

## 2012-04-12 DIAGNOSIS — I10 Essential (primary) hypertension: Secondary | ICD-10-CM

## 2012-04-12 DIAGNOSIS — R600 Localized edema: Secondary | ICD-10-CM

## 2012-04-12 DIAGNOSIS — R7309 Other abnormal glucose: Secondary | ICD-10-CM | POA: Diagnosis not present

## 2012-04-12 DIAGNOSIS — E119 Type 2 diabetes mellitus without complications: Secondary | ICD-10-CM | POA: Diagnosis not present

## 2012-04-12 DIAGNOSIS — M549 Dorsalgia, unspecified: Secondary | ICD-10-CM

## 2012-04-12 DIAGNOSIS — R202 Paresthesia of skin: Secondary | ICD-10-CM

## 2012-04-12 DIAGNOSIS — R109 Unspecified abdominal pain: Secondary | ICD-10-CM

## 2012-04-12 DIAGNOSIS — F411 Generalized anxiety disorder: Secondary | ICD-10-CM | POA: Diagnosis not present

## 2012-04-12 DIAGNOSIS — R209 Unspecified disturbances of skin sensation: Secondary | ICD-10-CM

## 2012-04-12 DIAGNOSIS — F329 Major depressive disorder, single episode, unspecified: Secondary | ICD-10-CM

## 2012-04-12 DIAGNOSIS — M79609 Pain in unspecified limb: Secondary | ICD-10-CM

## 2012-04-12 DIAGNOSIS — R7303 Prediabetes: Secondary | ICD-10-CM

## 2012-04-12 DIAGNOSIS — R609 Edema, unspecified: Secondary | ICD-10-CM | POA: Diagnosis not present

## 2012-04-12 LAB — BASIC METABOLIC PANEL
CO2: 25 mEq/L (ref 19–32)
Chloride: 106 mEq/L (ref 96–112)
Creat: 1.05 mg/dL (ref 0.50–1.35)
Glucose, Bld: 230 mg/dL — ABNORMAL HIGH (ref 70–99)
Sodium: 138 mEq/L (ref 135–145)

## 2012-04-12 NOTE — Telephone Encounter (Signed)
Patient decided to come see Dr. Jeanice Lim

## 2012-04-12 NOTE — Patient Instructions (Addendum)
I will set you up for your CT scan of your abdomen and your MRI of your back Get the labs drawn for your diabetes You can start the exercise program  I will set you up for a nutritionist  Increase your demadex to twice a day for the next 5 days, take an extra potassium with the second pill in the evening F/U 2 months

## 2012-04-13 ENCOUNTER — Telehealth: Payer: Self-pay | Admitting: Family Medicine

## 2012-04-13 ENCOUNTER — Other Ambulatory Visit: Payer: Self-pay | Admitting: Family Medicine

## 2012-04-13 DIAGNOSIS — R202 Paresthesia of skin: Secondary | ICD-10-CM | POA: Insufficient documentation

## 2012-04-13 DIAGNOSIS — E118 Type 2 diabetes mellitus with unspecified complications: Secondary | ICD-10-CM | POA: Insufficient documentation

## 2012-04-13 DIAGNOSIS — M79609 Pain in unspecified limb: Secondary | ICD-10-CM | POA: Insufficient documentation

## 2012-04-13 DIAGNOSIS — M549 Dorsalgia, unspecified: Secondary | ICD-10-CM | POA: Insufficient documentation

## 2012-04-13 MED ORDER — METFORMIN HCL 500 MG PO TABS: 500.0000 mg | ORAL_TABLET | Freq: Two times a day (BID) | ORAL | Status: DC

## 2012-04-13 NOTE — Assessment & Plan Note (Signed)
Continues to gain weight, discussed proper diet, given letter to start exercise

## 2012-04-13 NOTE — Assessment & Plan Note (Signed)
It is quite difficult to examine him but he continues to complain about abdominal , I will set him up for CT at an imaging place that can hold is weight

## 2012-04-13 NOTE — Assessment & Plan Note (Signed)
Unchanged, he is reluctant to use other medications and does not want to see psych

## 2012-04-13 NOTE — Assessment & Plan Note (Signed)
At goal.  

## 2012-04-13 NOTE — Assessment & Plan Note (Signed)
With his paresthesia of lower ext, and weight it is very difficult to examine him for true pathology, will send for MRI

## 2012-04-13 NOTE — Assessment & Plan Note (Signed)
Pt now diabetic, will start Metformin twice a day.

## 2012-04-13 NOTE — Assessment & Plan Note (Signed)
Unchanged, this continues to be a problem but pt not willing to seek care

## 2012-04-13 NOTE — Progress Notes (Signed)
  Subjective:    Patient ID: [name], male    DOB: Apr 28, 1966, 46 y.o.   MRN: [id number]  HPI   Pt here with multiple concerns, continues to have SOB, seen by cardiology no acute issues noted, he takes 1/2 tab of his demadex on regular basis, but has increased fluid in his legs. Continues to have abdominal pain that did not resolve after treatment of H.Pylori, he is willing to go to [facility] to have scan done.  He has also had increased pain and numbness in his lower ext particulary his thighs, he also admits to radiation of pain to legs.He has had increased thrist, and polyuria. He is upset that he continues to gain weight and states he does not eat much or the wrong foods- 24 hour recall shows a gallon of pomagrante juice, nachos, shrimp and pepper, bacon and egg biscuit. He would like a letter today to allow him to work-out at the gym.   Review of Systems- per above    GEN- denies fatigue, fever, weight loss,weakness, recent illness HEENT- denies eye drainage, change in vision, nasal discharge, CVS- denies chest pain, palpitations RESP- + SOB, cough, wheeze ABD- denies N/V, +change in stools,+ abd pain GU- denies dysuria, hematuria, dribbling, incontinence MSK- +joint pain, muscle aches, injury Neuro- denies headache, dizziness, syncope, seizure activity      Objective:   Physical Exam  GEN- NAD, alert and oriented x3, morbidly obese +weight gain HEENT- PERRL, EOMI,  MMM, oropharynx clear,  Neck- Supple,  CVS- RRR, no murmur RESP- CTAB ABD-NABS,large pannis, TTP upper quadrants, no rebound,no guarding, no CVA tenderness Back- TTP lumbar region, SLR equivocal Neuro- decreased DTR bilat, sensation grossly in tact  Psych- flat affect, depressed appearing,   Ext- 2+ pitting edema      Assessment & Plan:

## 2012-04-13 NOTE — Assessment & Plan Note (Signed)
He is to increase his diuretic to twice a day, he often states he can not tolerate this but is willing to try

## 2012-04-14 NOTE — Telephone Encounter (Signed)
Sent!

## 2012-04-15 ENCOUNTER — Other Ambulatory Visit: Payer: Self-pay

## 2012-04-15 DIAGNOSIS — R209 Unspecified disturbances of skin sensation: Secondary | ICD-10-CM | POA: Diagnosis not present

## 2012-04-15 DIAGNOSIS — M47817 Spondylosis without myelopathy or radiculopathy, lumbosacral region: Secondary | ICD-10-CM | POA: Diagnosis not present

## 2012-04-15 MED ORDER — ONETOUCH DELICA LANCETS MISC: Status: DC

## 2012-04-15 MED ORDER — GLUCOSE BLOOD VI STRP: ORAL_STRIP | Status: DC

## 2012-04-16 ENCOUNTER — Telehealth: Payer: Self-pay | Admitting: Family Medicine

## 2012-04-16 NOTE — Telephone Encounter (Signed)
Spoke with pt, given MRI results, mild DDD an facet DKD, mild narrowing no large disc protrusion or nerve involvement. I do not see anything on the MRI to suggest his symptoms, I think this is more of his weight and his DM.  No further intervention at this time

## 2012-04-27 ENCOUNTER — Ambulatory Visit: Payer: Medicare Other | Admitting: Family Medicine

## 2012-04-27 ENCOUNTER — Encounter: Payer: Self-pay | Admitting: Family Medicine

## 2012-04-28 ENCOUNTER — Telehealth: Payer: Self-pay | Admitting: Family Medicine

## 2012-04-28 DIAGNOSIS — R635 Abnormal weight gain: Secondary | ICD-10-CM | POA: Diagnosis not present

## 2012-04-28 DIAGNOSIS — E119 Type 2 diabetes mellitus without complications: Secondary | ICD-10-CM | POA: Diagnosis not present

## 2012-04-28 DIAGNOSIS — Z713 Dietary counseling and surveillance: Secondary | ICD-10-CM | POA: Diagnosis not present

## 2012-04-29 MED ORDER — GLUCOSE BLOOD VI STRP: ORAL_STRIP | Status: DC

## 2012-04-29 MED ORDER — ONETOUCH DELICA LANCETS MISC: Status: DC

## 2012-04-29 NOTE — Telephone Encounter (Signed)
[name] has been in the 200's when he checks it (has been checking 30 mins after meals) Reminded him 2 hours after meals. Also he said he hasn't been taking the metformin because for about 30 mins after he takes it he feels dizzy, SOB and like he is going to pass out. Doesn't like the way it makes him feel. Wanted to know what he needed to do?

## 2012-04-29 NOTE — Telephone Encounter (Signed)
Please tell him to take 1/2 tablet twice a day instead, then try to increase to 1 tablet twice a day in 2 weeks

## 2012-04-30 NOTE — Telephone Encounter (Signed)
Called patient and left message for them to return call at the office   

## 2012-04-30 NOTE — Telephone Encounter (Signed)
Pt aware.

## 2012-05-26 ENCOUNTER — Telehealth: Payer: Self-pay | Admitting: Family Medicine

## 2012-05-26 NOTE — Telephone Encounter (Signed)
Hasn't been able to get his strips or lancets covered. Called [facility] and advised him to take his insurance info to them

## 2012-06-19 DIAGNOSIS — Z79899 Other long term (current) drug therapy: Secondary | ICD-10-CM | POA: Diagnosis not present

## 2012-06-19 DIAGNOSIS — I10 Essential (primary) hypertension: Secondary | ICD-10-CM | POA: Diagnosis not present

## 2012-06-19 DIAGNOSIS — J209 Acute bronchitis, unspecified: Secondary | ICD-10-CM | POA: Diagnosis not present

## 2012-06-19 DIAGNOSIS — Z7982 Long term (current) use of aspirin: Secondary | ICD-10-CM | POA: Diagnosis not present

## 2012-06-19 DIAGNOSIS — F17200 Nicotine dependence, unspecified, uncomplicated: Secondary | ICD-10-CM | POA: Diagnosis not present

## 2012-06-19 DIAGNOSIS — R0602 Shortness of breath: Secondary | ICD-10-CM | POA: Diagnosis not present

## 2012-06-19 DIAGNOSIS — I517 Cardiomegaly: Secondary | ICD-10-CM | POA: Diagnosis not present

## 2012-06-19 DIAGNOSIS — R509 Fever, unspecified: Secondary | ICD-10-CM | POA: Diagnosis not present

## 2012-09-01 ENCOUNTER — Other Ambulatory Visit: Payer: Self-pay | Admitting: Family Medicine

## 2012-09-02 ENCOUNTER — Other Ambulatory Visit: Payer: Self-pay | Admitting: Family Medicine

## 2012-09-06 ENCOUNTER — Ambulatory Visit (INDEPENDENT_AMBULATORY_CARE_PROVIDER_SITE_OTHER): Payer: Medicare Other | Admitting: Cardiology

## 2012-09-06 ENCOUNTER — Encounter: Payer: Self-pay | Admitting: Cardiology

## 2012-09-06 VITALS — BP 145/85 | HR 67 | Ht 68.5 in | Wt >= 6400 oz

## 2012-09-06 DIAGNOSIS — I509 Heart failure, unspecified: Secondary | ICD-10-CM

## 2012-09-06 DIAGNOSIS — Z720 Tobacco use: Secondary | ICD-10-CM

## 2012-09-06 DIAGNOSIS — F17200 Nicotine dependence, unspecified, uncomplicated: Secondary | ICD-10-CM | POA: Diagnosis not present

## 2012-09-06 DIAGNOSIS — I4891 Unspecified atrial fibrillation: Secondary | ICD-10-CM

## 2012-09-06 DIAGNOSIS — I5022 Chronic systolic (congestive) heart failure: Secondary | ICD-10-CM | POA: Diagnosis not present

## 2012-09-06 DIAGNOSIS — I480 Paroxysmal atrial fibrillation: Secondary | ICD-10-CM

## 2012-09-06 DIAGNOSIS — G4733 Obstructive sleep apnea (adult) (pediatric): Secondary | ICD-10-CM

## 2012-09-06 NOTE — Patient Instructions (Addendum)
Your physician recommends that you schedule a follow-up appointment in: 2 months. Your physician has recommended you make the following change in your medication: Stop norvasc(amlodipine) All other medications will remain the same.  Your physician has requested that you have an echocardiogram. Echocardiography is a painless test that uses sound waves to create images of your heart. It provides your doctor with information about the size and shape of your heart and how well your heart's chambers and valves are working. This procedure takes approximately one hour. There are no restrictions for this procedure. You have been referred to Dr. Shelle Iron for sleep apnea.

## 2012-09-06 NOTE — Progress Notes (Signed)
HPI This is the first time I meeting this gentleman with a history of cardiomyopathy. He thinks it was related to alcohol which he was drinking in the past but no longer does. He also has had hypertension and sleep apnea. He has morbid obesity. However, he seems to be a quite intelligent and motivated gentleman who has been exercising routinely recently. He's lost quite a bit of weight. He's describing no new shortness of breath, PND or orthopnea. He will get dyspneic when he is exercising. However, he is more concerned because he is limited by leg weakness which happens after he's been walking for a while. He's apparently had some imaging of his back demonstrating no acute abnormality. He does have some back pain. He denies any chest pressure, neck or arm discomfort. He's had no palpitations consistent with previous fibrillation. He says his blood pressure has been running low he doesn't take his Norvasc. He was taking his spironolactone only when necessary. He was recently put on metformin but hasn't been taking this because his sugars have gone down. He does say that he has sleepiness and doesn't think his CPAP is working.  Allergies  Allergen Reactions  . Ace Inhibitors     REACTION: throat swelling  . Buspirone     dizziness    Current Outpatient Prescriptions  Medication Sig Dispense Refill  . aspirin 81 MG tablet Take 81 mg by mouth daily.        Marland Kitchen aspirin-acetaminophen-caffeine (EXCEDRIN MIGRAINE) 250-250-65 MG per tablet Take 1 tablet by mouth every 6 (six) hours as needed.      . clonazePAM (KLONOPIN) 1 MG tablet TAKE 1 TABLET BY MOUTH THREE TIMES DAILY AS NEEDED  90 tablet  1  . DIOVAN 80 MG tablet TAKE 1 TABLET TWICE A DAY  60 tablet  3  . glucose blood test strip Use as instructed once daily  DX 250.00  50 each  5  . metoprolol succinate (TOPROL-XL) 50 MG 24 hr tablet TAKE 1 TABLET TWICE A DAY  60 tablet  3  . ONETOUCH DELICA LANCETS MISC To use with one touch ultra meter for  once daily testing  DX 250.00  100 each  5  . pantoprazole (PROTONIX) 40 MG tablet TAKE 1 TABLET BY MOUTH DAILY  30 tablet  3  . potassium chloride (MICRO-K) 10 MEQ CR capsule Take 10 mEq by mouth as needed.      Marland Kitchen spironolactone (ALDACTONE) 25 MG tablet TAKE 1 TABLET EVERY DAY  90 tablet  3  . torsemide (DEMADEX) 20 MG tablet TAKE 1 TABLET EVERY DAY  30 tablet  3  . amLODipine (NORVASC) 10 MG tablet Take 1 tablet (10 mg total) by mouth daily. Hold for SBP </= 110  30 tablet  3  . metFORMIN (GLUCOPHAGE) 500 MG tablet Take 1 tablet (500 mg total) by mouth 2 (two) times daily with a meal.  60 tablet  3  . DISCONTD: pravastatin (PRAVACHOL) 40 MG tablet Take 40 mg by mouth daily.      Marland Kitchen DISCONTD: sertraline (ZOLOFT) 50 MG tablet Take 1 tablet (50 mg total) by mouth daily. Take  50mg  daily for 1 week then increase to 100mg  daily  60 tablet  2  . DISCONTD: torsemide (DEMADEX) 20 MG tablet Take 20 mg by mouth daily.        Current Facility-Administered Medications  Medication Dose Route Frequency Provider Last Rate Last Dose  . Influenza (>/= 3 years) inactive virus vaccine (FLVIRIN/FLUZONE)  injection SUSP 0.5 mL  0.5 mL Intramuscular Once Kerri Perches, MD        Past Medical History  Diagnosis Date  . Chronic systolic heart failure   . Edema   . Atrial fibrillation   . Obesity, unspecified   . Anxiety state, unspecified   . Psychiatric disorder   . Obstructive sleep apnea   . History of medication noncompliance   . Alcohol abuse   . Shortness of breath   . Migraine     Past Surgical History  Procedure Date  . Testicle surgery   . Cardiac catheterization     ROS:   As stated in the HPI and negative for all other systems.  PHYSICAL EXAM BP 145/85  Pulse 67  Ht 5' 8.5" (1.74 m)  Wt 200.399 kg (441 lb 12.8 oz)  BMI 66.20 kg/m2 GENERAL:  Well appearing HEENT:  Pupils equal round and reactive, fundi not visualized, oral mucosa unremarkable NECK:  No jugular venous  distention, waveform within normal limits, carotid upstroke brisk and symmetric, no bruits, no thyromegaly LYMPHATICS:  No cervical, inguinal adenopathy LUNGS:  Clear to auscultation bilaterally BACK:  No CVA tenderness CHEST:  Unremarkable HEART:  PMI not displaced or sustained,S1 and S2 within normal limits, no S3, no S4, no clicks, no rubs, no murmurs ABD:  Flat, positive bowel sounds normal in frequency in pitch, no bruits, no rebound, no guarding, no midline pulsatile mass, no hepatomegaly, no splenomegaly EXT:  2 plus pulses throughout, no edema, no cyanosis no clubbing SKIN:  No rashes no nodules NEURO:  Cranial nerves II through XII grossly intact, motor grossly intact throughout PSYCH:  Cognitively intact, oriented to person place and time  EKG:  Sinus rhythm, rate 67, axis within normal limits, intervals within normal limits, no acute ST-T wave changes.  09/06/2012   ASSESSMENT AND PLAN  HYPERTENSION  Blood pressure somewhat low at times. He has not been taking his Norvasc and I think he can stay off of this.  ATRIAL FIBRILLATION, PAROXYSMAL  He has no paroxysms of atrial fibrillation. No change in therapy is indicated.  Systolic CHF, chronic  He seems to be euvolemic. I reviewed his medications and encouraged him to take the spironolactone every day which she's not been doing. He can continue taking the beta blocker and ARB as he has been doing.  He will take his Demadex when necessary and he's quite educated about this. He's avoiding salt. I will check another echocardiogram as I do not see one since 2011.   Obesity I applauded his weight loss we discussed this at length and I encourage more of the same.  Sleep apnea He does not think that his mask isn't working any longer. He's motivated to have this treated. I will have him see Dr. Shelle Iron as a new patient.   Diabetes He has not been taking his metformin because his sugars have been low as he has exercised and lost  weight. He will stay off of this and I encouraged followup with his primary provider.

## 2012-09-09 ENCOUNTER — Other Ambulatory Visit: Payer: Self-pay

## 2012-09-09 ENCOUNTER — Other Ambulatory Visit (INDEPENDENT_AMBULATORY_CARE_PROVIDER_SITE_OTHER): Payer: Medicare Other

## 2012-09-09 DIAGNOSIS — I5022 Chronic systolic (congestive) heart failure: Secondary | ICD-10-CM | POA: Diagnosis not present

## 2012-09-09 DIAGNOSIS — I4891 Unspecified atrial fibrillation: Secondary | ICD-10-CM

## 2012-09-09 DIAGNOSIS — I480 Paroxysmal atrial fibrillation: Secondary | ICD-10-CM

## 2012-09-09 DIAGNOSIS — I509 Heart failure, unspecified: Secondary | ICD-10-CM | POA: Diagnosis not present

## 2012-09-13 ENCOUNTER — Telehealth: Payer: Self-pay | Admitting: *Deleted

## 2012-09-13 NOTE — Telephone Encounter (Signed)
Patient informed and copy sent to PCP. 

## 2012-09-13 NOTE — Telephone Encounter (Signed)
Message copied by Eustace Moore on Mon Sep 13, 2012 11:08 AM ------      Message from: Rollene Rotunda      Created: Mon Sep 13, 2012  1:01 AM       The EF is about 35% which is better than the 25% previously recorded.  Continue current plans.  Call Mr. Guagliardo with the results and send results to Milinda Antis, MD

## 2012-09-17 ENCOUNTER — Institutional Professional Consult (permissible substitution): Payer: Medicare Other | Admitting: Pulmonary Disease

## 2012-10-14 ENCOUNTER — Encounter: Payer: Self-pay | Admitting: Pulmonary Disease

## 2012-10-14 ENCOUNTER — Ambulatory Visit (INDEPENDENT_AMBULATORY_CARE_PROVIDER_SITE_OTHER): Payer: Medicare Other | Admitting: Pulmonary Disease

## 2012-10-14 VITALS — BP 134/88 | HR 70 | Temp 97.4°F | Ht 68.5 in | Wt >= 6400 oz

## 2012-10-14 DIAGNOSIS — G4730 Sleep apnea, unspecified: Secondary | ICD-10-CM

## 2012-10-14 NOTE — Progress Notes (Signed)
Subjective:    Patient ID: [name], male    DOB: Sep 30, 1966, 46 y.o.   MRN: [id number]  HPI The patient is a 46 year old morbidly obese male who been asked to see for management of obstructive sleep apnea.  He apparently was diagnosed in 2001, and I do not have his sleep study available.  He was initially started on CPAP, and then changed over to bilevel because of intolerance.  He has been doing fairly well with this and told most recently, when he began to notice frequent awakenings at night as well as nonrestorative sleep.  He also has been having significant daytime sleepiness with periods of inactivity.  The patient states that his bilevel machine is more than 44 years old, but apparently his equipment company has checked the device and feels that it is in working order.  He uses a nasal CPAP mask, but it is due for replacement.  He denies any issues with mouth opening.  The patient states that his pressure has not been adjusted and years, but his weight has increased at least 25 pounds over the last one year.  The patient also wears oxygen at night.  His last set of blood work does not show a significantly elevated serum bicarbonate level.  His Epworth score today is 14.  Sleep Questionnaire: What time do you typically go to bed?( Between what hours) VARIES 11PM TO 4 AM How long does it take you to fall asleep? MOST OF THE TIME 5 HOURS OR MORE How many times during the night do you wake up? 1 What time do you get out of bed to start your day? Do you drive or operate heavy machinery in your occupation? No How much has your weight changed (up or down) over the past two years? (In pounds) 5 lb (2.268 kg) Have you ever had a sleep study before? Yes If yes, location of study? NOT SURE If yes, date of study? NOT SURE Do you currently use CPAP? Yes If so, what pressure? 13/10 14/10 Do you wear oxygen at any time? Yes O2 Flow Rate (L/min) 2 L/min     Review of Systems  Constitutional: Positive for  unexpected weight change. Negative for fever.  HENT: Negative for ear pain, nosebleeds, congestion, sore throat, rhinorrhea, sneezing, trouble swallowing, dental problem, postnasal drip and sinus pressure.   Eyes: Negative for redness and itching.  Respiratory: Negative for cough, chest tightness, shortness of breath and wheezing.   Cardiovascular: Positive for palpitations. Negative for leg swelling.  Gastrointestinal: Negative for nausea and vomiting.  Genitourinary: Negative for dysuria.  Musculoskeletal: Negative for joint swelling.  Skin: Negative for rash.  Neurological: Headaches:  EVERY DAY .NOTHING HELPING   Hematological: Does not bruise/bleed easily.  Psychiatric/Behavioral: Positive for dysphoric mood. The patient is nervous/anxious.        Objective:   Physical Exam Constitutional:  Morbidly obese male, no acute distress  HENT:  Nares patent without discharge, but mildly narrowed on left  Oropharynx without exudate, palate and uvula are significantly elongated.   Eyes:  Perrla, eomi, no scleral icterus.  +disconjugate gaze.   Neck:  No JVD, no TMG  Cardiovascular:  Normal rate, regular rhythm, no rubs or gallops.  No murmurs        Intact distal pulses  Pulmonary :  Normal breath sounds, no stridor or respiratory distress   No rales, rhonchi, or wheezing  Abdominal:  Soft, nondistended, bowel sounds present.  No tenderness noted.   Musculoskeletal:  1+ lower extremity edema noted.  Lymph Nodes:  No cervical lymphadenopathy noted  Skin:  No cyanosis noted  Neurologic:  Alert, appropriate, moves all 4 extremities without obvious deficit.         Assessment & Plan:

## 2012-10-14 NOTE — Patient Instructions (Addendum)
Will have your DME put you on a loaner bipap auto machine for the next 3 weeks to re- calibrated your pressure.  Will call you with results. You need to work on weight reduction. Will arrange followup with me once I see your download.

## 2012-10-14 NOTE — Assessment & Plan Note (Signed)
The patient has a history of obstructive sleep apnea for which he has been on bilevel.  He is now having significant issues with sleep disruption, nonrestorative sleep, and significant daytime sleepiness.  It is unclear whether his machine is functioning properly, whether his pressure may be at an adequate level, or whether his mask is fitting properly and not leaking.  Also had some concerns whether he may be developing obesity hypoventilation with chronic daytime hypercarbia.  However, his recent blood work in May did not show an elevated serum bicarbonate.  At this point, I would like to re\re optimize his pressure, and also make sure that his bilevel machine isn't working order.  We may also have to consider changing to a full face mask depending upon how he responds.  Finally, I have stressed to the patient the importance of aggressive weight loss.  If he is not able to do this, and continues to gain weight, he is probably going to need a tracheostomy with possible chronic nocturnal ventilation.

## 2012-11-03 ENCOUNTER — Other Ambulatory Visit: Payer: Self-pay | Admitting: Family Medicine

## 2012-11-08 ENCOUNTER — Other Ambulatory Visit: Payer: Self-pay | Admitting: Pulmonary Disease

## 2012-11-08 ENCOUNTER — Telehealth: Payer: Self-pay | Admitting: Pulmonary Disease

## 2012-11-08 ENCOUNTER — Encounter: Payer: Self-pay | Admitting: Pulmonary Disease

## 2012-11-08 DIAGNOSIS — G4733 Obstructive sleep apnea (adult) (pediatric): Secondary | ICD-10-CM

## 2012-11-08 NOTE — Telephone Encounter (Signed)
Pt is aware of BiPap settings for optimal pressure and will take his machine to [facility] to have this changed to the new setting. He also stated that his mask is probably a year old and he will get a new one when there. Pt will see KC back in 6 months unless having problems before then.

## 2012-11-08 NOTE — Telephone Encounter (Signed)
Still unable to contact the pt at numbers we have. I have called the pt's mother and she will get a msg to the pt and ask that he call [facility] Fiscal.

## 2012-11-08 NOTE — Telephone Encounter (Signed)
ATC x 3. NA. Voicemail on cell has not been set up. On home phone it sounded like someone picked up and then hung up. WCB.

## 2012-11-08 NOTE — Telephone Encounter (Signed)
BiPap titration report that was requested last OV is in folder for review.   Dr Shelle Iron please advise. Thanks.

## 2012-11-08 NOTE — Telephone Encounter (Signed)
Please let pt know that his optimal pressure appears to be 16/12.  His mask leak is a little above where I would like to see it, and make sure the seals are new/up to date.  He needs an ov with me in 6mos, but call if he is not doing well on this pressure.  An order has been sent to his dme to set his old machine.

## 2012-11-11 ENCOUNTER — Ambulatory Visit (INDEPENDENT_AMBULATORY_CARE_PROVIDER_SITE_OTHER): Payer: Medicare Other | Admitting: Cardiology

## 2012-11-11 ENCOUNTER — Encounter: Payer: Self-pay | Admitting: Cardiology

## 2012-11-11 VITALS — BP 143/91 | HR 75 | Ht 68.0 in | Wt >= 6400 oz

## 2012-11-11 DIAGNOSIS — R0602 Shortness of breath: Secondary | ICD-10-CM

## 2012-11-11 DIAGNOSIS — M549 Dorsalgia, unspecified: Secondary | ICD-10-CM

## 2012-11-11 DIAGNOSIS — E119 Type 2 diabetes mellitus without complications: Secondary | ICD-10-CM

## 2012-11-11 DIAGNOSIS — I480 Paroxysmal atrial fibrillation: Secondary | ICD-10-CM

## 2012-11-11 DIAGNOSIS — I4891 Unspecified atrial fibrillation: Secondary | ICD-10-CM

## 2012-11-11 DIAGNOSIS — Z79899 Other long term (current) drug therapy: Secondary | ICD-10-CM | POA: Diagnosis not present

## 2012-11-11 DIAGNOSIS — I509 Heart failure, unspecified: Secondary | ICD-10-CM

## 2012-11-11 DIAGNOSIS — R209 Unspecified disturbances of skin sensation: Secondary | ICD-10-CM

## 2012-11-11 DIAGNOSIS — F17200 Nicotine dependence, unspecified, uncomplicated: Secondary | ICD-10-CM

## 2012-11-11 DIAGNOSIS — M79609 Pain in unspecified limb: Secondary | ICD-10-CM

## 2012-11-11 DIAGNOSIS — Z720 Tobacco use: Secondary | ICD-10-CM

## 2012-11-11 DIAGNOSIS — R202 Paresthesia of skin: Secondary | ICD-10-CM

## 2012-11-11 DIAGNOSIS — I5022 Chronic systolic (congestive) heart failure: Secondary | ICD-10-CM

## 2012-11-11 MED ORDER — VALSARTAN 80 MG PO TABS: 120.0000 mg | ORAL_TABLET | Freq: Two times a day (BID) | ORAL | Status: DC

## 2012-11-11 MED ORDER — BUPROPION HCL ER (SR) 150 MG PO TB12: 150.0000 mg | ORAL_TABLET | Freq: Two times a day (BID) | ORAL | Status: DC

## 2012-11-11 NOTE — Patient Instructions (Addendum)
   Increase Diovan to 120mg  twice a day    Wellbutrin - only use if decide to stop smoking  Continue all other current medications. Labs - due in 1 week (around December 19) for BMET, BNP, HgA1C Office will contact with results Follow up in  2 months

## 2012-11-11 NOTE — Progress Notes (Signed)
Patient ID: [name], male   DOB: 1965/12/08, 46 y.o.   MRN: [id number] PCP: Dr. Jeanice Lim  46 yo with history of nonischemic cardiomyopathy presents for cardiology followup.  His last echo showed EF 35-40%, which is improved.  The cardiomyopathy is likely due to ETOH abuse, he has quit.  Past cath showed no significant coronary disease.  He is morbidly obese but has been working to lose weight.  He is dieting and going to cardiac rehab.  Weight is down 7 lbs compared to prior appointment.  He is walking about 4 miles a day on the treadmill and using the Nustep.  He is short of breath walking up steps but does well in general on flat ground.  Some fatigue.  He is still smoking and wants to quit.   ECG: NSR, IVCD, poor anterior R wave progression.   Labs (5/13): K 4.1, creatinine 1.05  PMH: 1. Nonischemic cardiomyopathy: Prior cath with no significant CAD.  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  Prior echoes with EF as low as 25%.  Last echo (9/13) with EF 35-40%, moderate to severe LV dilation, diffuse hypokinesis, mild MR.  2. HTN 3. OSA: on Bipap 4. Morbid obesity 5. Paroxysmal atrial fibrillation: Not documented recently.  He is not on anticoagulation.  6. Smoker.   SH: On disability, lives in [place], prior ETOH abuse but now dose not drink, no drugs, smokes 1 ppd.  Married with a son.   FH: No premature CAD.    ROS: All systems reviewed and negative except as per HPI.   BP 143/91  Pulse 75  Ht 5\' 8"  (1.727 m)  Wt 434 lb (196.861 kg)  BMI 65.99 kg/m2 General: NAD, obese.  Neck: Thick, no JVD, no thyromegaly or thyroid nodule.  Lungs: Clear to auscultation bilaterally with normal respiratory effort. CV: Nondisplaced PMI.  Heart regular S1/S2, no S3/S4, no murmur.  Trace ankle edema.  No carotid bruit.  Normal pedal pulses.  Abdomen: Soft, nontender, no hepatosplenomegaly, no distention.  Neurologic: Alert and oriented x 3.  Psych:  Normal affect. Extremities: No clubbing or cyanosis.   Assessment/Plan: 1. Chronic systolic CHF: NYHA class II symptoms.  Exam is difficult for volume but I do not think he is particularly volume overloaded.   - Continue current Toprol XL, spironolactone, and torsemide. - Increase valsartan to 120 mg bid.  - After Toprol and valsartan optimized, next step will be Bidil. - EF not in range for ICD at this point (not < 35%).  - BMET/BNP in 1 week.  - Continue to avoid ETOH.  2. Obesity: Working on weight loss.  Continue diet, cardiac rehab.  3. Smoking: Wants to quit.  I gave him a prescription for Wellbutrin today.   Marca Ancona 11/11/2012 1:08 PM

## 2012-11-29 ENCOUNTER — Telehealth: Payer: Self-pay | Admitting: Cardiology

## 2012-11-29 NOTE — Telephone Encounter (Signed)
[facility]

## 2012-12-02 ENCOUNTER — Ambulatory Visit: Payer: Medicare Other | Admitting: Cardiology

## 2012-12-03 ENCOUNTER — Telehealth: Payer: Self-pay | Admitting: Pulmonary Disease

## 2012-12-03 DIAGNOSIS — G4733 Obstructive sleep apnea (adult) (pediatric): Secondary | ICD-10-CM

## 2012-12-03 NOTE — Telephone Encounter (Signed)
No answer

## 2012-12-03 NOTE — Telephone Encounter (Signed)
We can try setting the ramp to the longest time period if he has not done this yet.   This will give him before it gets to the highest pressure.  The other option is to set his pressure between the old and new, and get him to work on weight loss.  As his weight comes down, his pressure needs come down.

## 2012-12-03 NOTE — Telephone Encounter (Signed)
I spoke with pt he states when his machine ramps up it makes him feel like he is choking and causes his heart to beat fast. He stated sometimes it causes him to have CP. He stated he has tried using the cpap without oxygen and it still happened. He states he is having to pull the mask off bc he can't breathe. He has tried just using the oxygen but can tell it's not helping either. Pt is currently bilevel 16/12 and before he was 13/10 and it was not enough. Pt aware this will be taking care of Monday since DME are closed. He was fine with this. Please advise Dr. Shelle Iron thanks

## 2012-12-03 NOTE — Telephone Encounter (Signed)
Informed patient med was not Chantix.  [facility]  Also, c/o some problems with new titration on his c-pap machine.  Advised him to call MD managing this (Dr. Shelle Iron).  Patient verbalized understanding.

## 2012-12-06 NOTE — Telephone Encounter (Signed)
[email or web address].Jennifer Castillo, CMA  

## 2012-12-07 NOTE — Telephone Encounter (Signed)
[email or web address]  

## 2012-12-07 NOTE — Telephone Encounter (Signed)
Pt returned triage's call.  Holly D Pryor ° °

## 2012-12-07 NOTE — Telephone Encounter (Signed)
Called, spoke with pt.  Informed him order has been placed to have bipap pressure changed to 14/11.  He verbalized understanding of this and states he will call back in a few weeks with feedback; sooner if needed.

## 2012-12-07 NOTE — Telephone Encounter (Signed)
Called, spoke with pt.  Informed him of below recs per Dr. Shelle Iron.  Pt states he is working on wt loss.  States he doesn't have a problem while it is ramping up but only has a problem when it gets to the full pressure.  He would like to try having the pressure changed.  He feels the current pressure is too high, but the old pressure was too low.  Dr. Shelle Iron, pls advise on the setting.  Thank you.  DME - [facility]

## 2012-12-07 NOTE — Telephone Encounter (Signed)
LMTCB x 1 

## 2012-12-07 NOTE — Telephone Encounter (Signed)
Lets try 14/11 and have him give some feedback in a few weeks.

## 2012-12-07 NOTE — Telephone Encounter (Signed)
Order placed -- [email or web address]

## 2012-12-07 NOTE — Telephone Encounter (Signed)
RETURNING CALL [phone]

## 2012-12-07 NOTE — Telephone Encounter (Signed)
Returning call can be reached at [phone].[name] ° °

## 2012-12-08 ENCOUNTER — Encounter: Payer: Self-pay | Admitting: *Deleted

## 2012-12-17 DIAGNOSIS — Z5181 Encounter for therapeutic drug level monitoring: Secondary | ICD-10-CM | POA: Diagnosis not present

## 2012-12-17 DIAGNOSIS — R0602 Shortness of breath: Secondary | ICD-10-CM | POA: Diagnosis not present

## 2012-12-17 DIAGNOSIS — E119 Type 2 diabetes mellitus without complications: Secondary | ICD-10-CM | POA: Diagnosis not present

## 2012-12-17 DIAGNOSIS — Z79899 Other long term (current) drug therapy: Secondary | ICD-10-CM | POA: Diagnosis not present

## 2012-12-31 ENCOUNTER — Other Ambulatory Visit: Payer: Self-pay | Admitting: Family Medicine

## 2013-01-11 ENCOUNTER — Telehealth: Payer: Self-pay | Admitting: Cardiology

## 2013-01-11 ENCOUNTER — Other Ambulatory Visit: Payer: Self-pay | Admitting: Family Medicine

## 2013-01-11 NOTE — Telephone Encounter (Signed)
Patient called requesting refill of Klonopin.  Advised patient this was handled by Dr. Jeanice Lim office (see was called in on 12-31-12).  Patient states he did not know why it was sent to Dr. Jeanice Lim.  Advised this is not a medication prescribed by cardiology. Advised I would ask nurse to contact him regarding this medication and explain if primary care physician would handled refills of this.  Patient claims he is out of this medication.

## 2013-01-11 NOTE — Telephone Encounter (Addendum)
States he has contacted PMD regarding below.  Confirmed that info below was correct.  States they did send it to pharmacy for him on 1/31, but pharm did not receive (needed to have rx faxed or phoned in).  Advised patient to follow up with PMD regarding filling of this medication.

## 2013-01-17 ENCOUNTER — Ambulatory Visit (INDEPENDENT_AMBULATORY_CARE_PROVIDER_SITE_OTHER): Payer: Medicare Other | Admitting: Cardiology

## 2013-01-17 ENCOUNTER — Encounter: Payer: Self-pay | Admitting: Family Medicine

## 2013-01-17 ENCOUNTER — Encounter: Payer: Self-pay | Admitting: Cardiology

## 2013-01-17 VITALS — BP 138/85 | Ht 68.5 in | Wt >= 6400 oz

## 2013-01-17 DIAGNOSIS — F411 Generalized anxiety disorder: Secondary | ICD-10-CM

## 2013-01-17 DIAGNOSIS — R109 Unspecified abdominal pain: Secondary | ICD-10-CM

## 2013-01-17 DIAGNOSIS — I517 Cardiomegaly: Secondary | ICD-10-CM | POA: Diagnosis not present

## 2013-01-17 DIAGNOSIS — I509 Heart failure, unspecified: Secondary | ICD-10-CM

## 2013-01-17 DIAGNOSIS — F17200 Nicotine dependence, unspecified, uncomplicated: Secondary | ICD-10-CM

## 2013-01-17 DIAGNOSIS — R0602 Shortness of breath: Secondary | ICD-10-CM | POA: Diagnosis not present

## 2013-01-17 DIAGNOSIS — I480 Paroxysmal atrial fibrillation: Secondary | ICD-10-CM

## 2013-01-17 DIAGNOSIS — Z79899 Other long term (current) drug therapy: Secondary | ICD-10-CM

## 2013-01-17 DIAGNOSIS — I4891 Unspecified atrial fibrillation: Secondary | ICD-10-CM | POA: Diagnosis not present

## 2013-01-17 DIAGNOSIS — I5022 Chronic systolic (congestive) heart failure: Secondary | ICD-10-CM

## 2013-01-17 DIAGNOSIS — Z720 Tobacco use: Secondary | ICD-10-CM

## 2013-01-17 MED ORDER — CLONAZEPAM 1 MG PO TABS: 1.0000 mg | ORAL_TABLET | Freq: Three times a day (TID) | ORAL | Status: DC | PRN

## 2013-01-17 MED ORDER — TORSEMIDE 20 MG PO TABS: 40.0000 mg | ORAL_TABLET | Freq: Every day | ORAL | Status: DC

## 2013-01-17 MED ORDER — POTASSIUM CHLORIDE CRYS ER 20 MEQ PO TBCR: 20.0000 meq | EXTENDED_RELEASE_TABLET | Freq: Every day | ORAL | Status: DC

## 2013-01-17 MED ORDER — VALSARTAN 80 MG PO TABS: ORAL_TABLET | ORAL | Status: DC

## 2013-01-17 NOTE — Patient Instructions (Addendum)
   Increase Valsartan to 120mg  twice a day (new sent to pharm)  Increase Torsemide to 40mg  daily (new sent to pharm)  Increase Potassium to daily (new sent to pharm)  One time refill given on Clonazepam (script printed, must hand deliver to pharm)  TODAY - chest x-ray & labs for BNP, CBC, BMET, LFT  Repeat labs just prior to next office visit for BMET, BNP (orders given today)  Office will notify of results   Follow up in one week

## 2013-01-17 NOTE — Progress Notes (Signed)
Patient ID: [name], male   DOB: 12-13-1965, 47 y.o.   MRN: [id number] I received a message from patient's cardiologist that he refilled his Klonopin for 30 day supply. Of note I sent in Ativan on February 1 #60 tablets. [name] has not been in my office since May.  He will need to schedule appointment for before further refills of medication. Of note [facility] and [facility] had not filled either the Ativan or the Klonopin since December which was his old prescription. There is nothing on narcotic data base since December

## 2013-01-17 NOTE — Progress Notes (Signed)
Patient ID: [name], male   DOB: 11-21-1966, 47 y.o.   MRN: [id number] PCP: Dr. Jeanice Lim  47 yo with history of nonischemic cardiomyopathy presents for cardiology followup.  His last echo showed EF 35-40% in 9/13, which was improved.  The cardiomyopathy is likely due to ETOH abuse, he has quit.  Past cath showed no significant coronary disease.  He is morbidly obese but has been working to lose weight. He had been in cardiac rehab but has not gone since December.  Patient is not doing as well as at last appointment.  Over the last week, he feels like his abdomen has become distended and tight.  He is not constipated.  He has no abdominal pain.  His appetite has been poor.  He has had significantly increased dyspnea recently.  He is short of breath just walking around his house.  Weight is up 1 lb only since last appointment (last weight was 427 - reading in the computer for that day is inaccurate - and today is 428).  No fever, cough, or congestion.  He has not been smoking because he does not feel good.  Never started Wellbutrin.  He also has been having "panic attacks" and has run out of his Klonopin.   Of note, he never increased his valsartan after last appointment and is still taking 80 mg bid.  He also is not taking metformin.    ECG: NSR, IVCD  Labs (5/13): K 4.1, creatinine 1.05 Labs (1/14): K 3.8, creatinine 1.16, BNP 54  PMH: 1. Nonischemic cardiomyopathy: Prior cath with no significant CAD.  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  Prior echoes with EF as low as 25%.  Last echo (9/13) with EF 35-40%, moderate to severe LV dilation, diffuse hypokinesis, mild MR.  2. HTN 3. OSA: on Bipap 4. Morbid obesity 5. Paroxysmal atrial fibrillation: Not documented recently.  He is not on anticoagulation.  6. Smoker.  7. Anxiety/panic attacks  SH: On disability, lives in [place], prior ETOH abuse but now dose not drink, no drugs, smokes 1 ppd.  Married  with a son.   FH: No premature CAD.    ROS: All systems reviewed and negative except as per HPI.   BP 138/85  Ht 5' 8.5" (1.74 m)  Wt 428 lb 6.4 oz (194.321 kg)  BMI 64.18 kg/m2 General: NAD, obese.  Neck: Thick, JVP difficult, possibly 8-9 cm, no thyromegaly or thyroid nodule.  Lungs: Clear to auscultation bilaterally with normal respiratory effort. CV: Nondisplaced PMI.  Heart regular S1/S2, no S3/S4, no murmur.  1+ edema to knees bilaterally.  No carotid bruit.  Normal pedal pulses.  Abdomen: Soft, nontender, unable to palpate liver edge, mild distention.  Neurologic: Alert and oriented x 3.  Psych: Normal affect. Extremities: No clubbing or cyanosis.   Assessment/Plan: 1. Chronic systolic CHF: NYHA class IIIb symptoms, worse than in the past for about a week.  He has abdominal distention and a bloating sensation.  Exam is difficult for volume but he does appear volume up compared to last appointment.  He says he has been taking all his medications.  Labs looked ok in 1/14.  - Continue current Toprol XL and spironolactone.  - Increase valsartan to 120 mg bid today.  - I will also increase torsemide to 40 mg daily for increased diuresis and add KCl 20 daily.  - Given dyspnea and abdominal bloating, will get CBC (for white count), LFTs, BMET, and  BNP today.  I am also going to get a CXR to make sure that he does not have PNA.   - EF not in range for ICD at this point (not < 35%).  - He will need followup appointment with BNP and BMET in 1 week.   - Continue to avoid ETOH.  2. Obesity: Continue efforts at weight loss.  When he is feeling better, needs to get back to cardiac rehab.   3. Smoking: Has not been smoking but says it is because he feels "bad."  He is going to try the Wellbutrin to hold down cravings.   4. Anxiety/Panic: He has run out of Klonopin and has been having episodes consistent with prior panic attacks.  I will arrange a one-time refill but would like him to get from  Dr. Jeanice Lim in the future.  5. Paroxysmal atrial fibrillation: No recent atrial fibrillation noted.  If has recurrence, would need anticoagulation.   Marca Ancona 01/17/2013 10:01 AM

## 2013-01-17 NOTE — Addendum Note (Signed)
Addended by: Lesle Chris on: 01/17/2013 10:09 AM   Modules accepted: Orders

## 2013-01-18 ENCOUNTER — Ambulatory Visit (INDEPENDENT_AMBULATORY_CARE_PROVIDER_SITE_OTHER): Payer: Medicare Other | Admitting: Family Medicine

## 2013-01-18 ENCOUNTER — Encounter: Payer: Self-pay | Admitting: Family Medicine

## 2013-01-18 ENCOUNTER — Telehealth: Payer: Self-pay | Admitting: Family Medicine

## 2013-01-18 VITALS — BP 140/94 | HR 82 | Resp 16 | Wt >= 6400 oz

## 2013-01-18 DIAGNOSIS — I10 Essential (primary) hypertension: Secondary | ICD-10-CM

## 2013-01-18 DIAGNOSIS — G43909 Migraine, unspecified, not intractable, without status migrainosus: Secondary | ICD-10-CM

## 2013-01-18 DIAGNOSIS — I509 Heart failure, unspecified: Secondary | ICD-10-CM

## 2013-01-18 DIAGNOSIS — I5022 Chronic systolic (congestive) heart failure: Secondary | ICD-10-CM

## 2013-01-18 DIAGNOSIS — R0602 Shortness of breath: Secondary | ICD-10-CM

## 2013-01-18 DIAGNOSIS — E119 Type 2 diabetes mellitus without complications: Secondary | ICD-10-CM

## 2013-01-18 DIAGNOSIS — F329 Major depressive disorder, single episode, unspecified: Secondary | ICD-10-CM

## 2013-01-18 DIAGNOSIS — M25519 Pain in unspecified shoulder: Secondary | ICD-10-CM | POA: Insufficient documentation

## 2013-01-18 DIAGNOSIS — F411 Generalized anxiety disorder: Secondary | ICD-10-CM

## 2013-01-18 MED ORDER — LOSARTAN POTASSIUM 50 MG PO TABS: ORAL_TABLET | ORAL | Status: DC

## 2013-01-18 MED ORDER — SPIRONOLACTONE 25 MG PO TABS: ORAL_TABLET | ORAL | Status: DC

## 2013-01-18 MED ORDER — CLONAZEPAM 1 MG PO TABS: 1.0000 mg | ORAL_TABLET | Freq: Three times a day (TID) | ORAL | Status: DC | PRN

## 2013-01-18 MED ORDER — PANTOPRAZOLE SODIUM 40 MG PO TBEC: 40.0000 mg | DELAYED_RELEASE_TABLET | Freq: Every day | ORAL | Status: DC

## 2013-01-18 NOTE — Assessment & Plan Note (Signed)
No current meds, he truly was non complaint when meds were prescribed in the past, he had recent A1C done, I will check this before deciding on therapy

## 2013-01-18 NOTE — Telephone Encounter (Signed)
Patient given results from yesterday his chest x-ray was normal there was no sign of volume overload or pneumonia. His blood work showed normal renal function he had an elevated blood sugar of 130 but he had had something to eat. His BNP was normal at 28 overall his labs looked pretty good I advised him to continue on the regimen that his heart doctor . set forth for him as we discussed at today's office visit he will have , labs rechecked next week with repeat labs by his heart doctor,  His blood pressure and LE could benefit from the increased diuretic

## 2013-01-18 NOTE — Patient Instructions (Addendum)
Continue the klonopin  Increase the Demadex to 40mg  a day New blood pressure medicine losartan  Take acetaminophen twice a day as needed Continue working out! Congratulations on the weight loss! Stop the excedrin  I will check labs for your diabetes  Referral to podiatrist  Xray to be obtained from [facility]  F/U 3 month

## 2013-01-18 NOTE — Assessment & Plan Note (Signed)
See chart, he never received ativan, will continue the xanax

## 2013-01-18 NOTE — Assessment & Plan Note (Signed)
He has lost some weight, he still needs considerable weight loss, advised to restart his exercise program

## 2013-01-18 NOTE — Assessment & Plan Note (Signed)
Untreated, he would benefit from wellbutrin if he were to start it

## 2013-01-18 NOTE — Assessment & Plan Note (Signed)
During her visit he does not appear to be in any, his dyspnea is likely multifactorial he's been followed by his heart doctor he had an chest x-ray and some blood work we will followup with this Obesity, OSA, CHF

## 2013-01-18 NOTE — Assessment & Plan Note (Signed)
No particular injury advised against NSAIDs because of his current heart and fluid status acetaminophen he is fearful  Of taking any other medication

## 2013-01-18 NOTE — Assessment & Plan Note (Signed)
Chronic history of headache and migraine, advised him to stop the Excedrin and take regular tylenol,   Often he is non compliant with suggestions given and has fear of taking medications which makes treating him very difficult

## 2013-01-18 NOTE — Assessment & Plan Note (Signed)
Discussed the importance of following through with his medication regimen by his heart Doctor He should take Demadex 40 mg daily continue the spironolactone

## 2013-01-18 NOTE — Assessment & Plan Note (Signed)
Blood pressure little elevated today he had recent medication changes however he has not been taking the increased dose of Diovan. When this runs out he will need to start losartan as his insurance will not cover Diovan. I did discuss this with his cardiologist

## 2013-01-18 NOTE — Progress Notes (Signed)
  Subjective:    Patient ID: [name], male    DOB: 1966-05-24, 47 y.o.   MRN: [id number]  HPI  Pt here to f/u chronic medical problems,he has multiple questions and concerns about his medications Followed by cardiology, has not been in the office since May of 2013 Heart failure he was seen by cardiologist yesterday there was concern about increased swelling in his abdomen and shortness of breath he had chest x-ray done which does not have the results of. He was instructed to increase his Diovan as well as his Demadex however he has not done this. He states he did take 1 and 1/2 tablets of his Demadex but thought the dose was too strong that was recommended. Diabetes mellitus he never took the metformin he states he had another A1c done by the heart Dr. which was 6 something however I do not have the results of this he has lost about 25 pounds or more since my last visit He continues to have increased anxiety and depressive symptoms he was given Wellbutrin but has not started this because he knows it is for depression as well as smoking cessation. He does uses Klonopin but states he does not sleep as well at bedtime Left shoulder pain on and off for the past few weeks denies any particular injury the pain is mostly under his shoulder blade, he also gets headaches with this    Review of Systems  GEN- denies fatigue, fever, weight loss,weakness, recent illness HEENT- denies eye drainage, change in vision, nasal discharge, CVS- denies chest pain, palpitations RESP- denies SOB, cough, wheeze ABD- denies N/V, change in stools, abd pain GU- denies dysuria, hematuria, dribbling, incontinence MSK- + joint pain, muscle aches, injury Neuro- + headache, dizziness, syncope, seizure activity      Objective:   Physical Exam GEN- NAD, alert and oriented x3, morbidly obese  HEENT- PERRL, EOMI,  MMM, oropharynx clear,  Neck- Supple, normal ROM CVS- RRR, no murmur RESP- CTAB ABD-NABS,large  pannis, NT,ND,no rebound Psych- not depresed or overly anxious  Ext- 1+ edema billat MSK- Rotator cuff in tact, biceps in tact, mild TTP above lateral olecranon, +spasm and tenderness beneath left shoulder blade, strength equal bilat UE Diabetic foot exam below       Assessment & Plan:

## 2013-01-21 ENCOUNTER — Telehealth: Payer: Self-pay | Admitting: Family Medicine

## 2013-01-25 DIAGNOSIS — R109 Unspecified abdominal pain: Secondary | ICD-10-CM | POA: Diagnosis not present

## 2013-01-25 DIAGNOSIS — R0602 Shortness of breath: Secondary | ICD-10-CM | POA: Diagnosis not present

## 2013-01-25 DIAGNOSIS — I4891 Unspecified atrial fibrillation: Secondary | ICD-10-CM | POA: Diagnosis not present

## 2013-01-25 DIAGNOSIS — Z79899 Other long term (current) drug therapy: Secondary | ICD-10-CM | POA: Diagnosis not present

## 2013-01-25 NOTE — Telephone Encounter (Signed)
I see where you said in discharge summary that you were going to check for diabetes but I don't see any lab results

## 2013-01-25 NOTE — Telephone Encounter (Signed)
Please let him know A1C is good at 6.1%, no diabetic medications needed, continue with the weight loss Take only 50mg  of the losartan once a day for his blood pressure

## 2013-01-26 ENCOUNTER — Other Ambulatory Visit: Payer: Self-pay | Admitting: Cardiology

## 2013-01-26 ENCOUNTER — Encounter: Payer: Self-pay | Admitting: Physician Assistant

## 2013-01-26 ENCOUNTER — Ambulatory Visit (INDEPENDENT_AMBULATORY_CARE_PROVIDER_SITE_OTHER): Payer: Medicare Other | Admitting: Physician Assistant

## 2013-01-26 VITALS — BP 118/71 | HR 86 | Ht 68.5 in | Wt >= 6400 oz

## 2013-01-26 DIAGNOSIS — I5022 Chronic systolic (congestive) heart failure: Secondary | ICD-10-CM | POA: Diagnosis not present

## 2013-01-26 DIAGNOSIS — Z79899 Other long term (current) drug therapy: Secondary | ICD-10-CM

## 2013-01-26 DIAGNOSIS — I428 Other cardiomyopathies: Secondary | ICD-10-CM

## 2013-01-26 DIAGNOSIS — I4891 Unspecified atrial fibrillation: Secondary | ICD-10-CM | POA: Diagnosis not present

## 2013-01-26 DIAGNOSIS — I10 Essential (primary) hypertension: Secondary | ICD-10-CM

## 2013-01-26 DIAGNOSIS — I509 Heart failure, unspecified: Secondary | ICD-10-CM

## 2013-01-26 MED ORDER — TORSEMIDE 20 MG PO TABS: 20.0000 mg | ORAL_TABLET | Freq: Two times a day (BID) | ORAL | Status: DC

## 2013-01-26 NOTE — Patient Instructions (Addendum)
   Change Demadex to 20mg  twice a day    Stop K-Dur (potassium) Continue all other current medications. Lab for BMET (Monday) 01/31/2013 Office will notify of results  Follow up in  1 month

## 2013-01-26 NOTE — Progress Notes (Signed)
Primary Cardiologist: Marca Ancona, MD   HPI: Scheduled 1 week followup.  When recently seen on February 17, Dr. Shirlee Latch adjust medications with increase in valsartan to 120 twice a day, torsemide to 40 daily, and K. Dur to 20 daily. She also allowed a one-time refill of Klonopin. Baseline BMET and repeat in one week essentially unchanged, as noted below:   - BUN 15, creatinine 1.1, and potassium 3.7. Slightly elevated baseline Indirect bilirubin 0.9, otherwise normal LFTs. BNP unchanged at 28. Baseline NL CBC.   - Baseline CXR: CM; no acute abnormality  Clinically, he reports improvement in his abdominal girth, following the recent increase in the Demadex. However, he also cited a recent BP of 117/57 at home, prompting him to call his primary M.D. He reported feeling sleepy, tired, and weak, and she advised that he cut back on the valsartan to 120 mg daily. He started this reduced dose today.  He otherwise reports compliance with his medications, refrains from added salt in his diet, and has even begun sessions with cardiac rehabilitation, [facility] at [facility].  Allergies  Allergen Reactions  . Ace Inhibitors     REACTION: throat swelling  . Buspirone     dizziness    Current Outpatient Prescriptions  Medication Sig Dispense Refill  . aspirin 81 MG tablet Take 81 mg by mouth daily.        Marland Kitchen aspirin-acetaminophen-caffeine (EXCEDRIN MIGRAINE) 250-250-65 MG per tablet Take 1 tablet by mouth every 6 (six) hours as needed.      Marland Kitchen buPROPion (WELLBUTRIN SR) 150 MG 12 hr tablet Take 1 tablet (150 mg total) by mouth 2 (two) times daily.  60 tablet  1  . clonazePAM (KLONOPIN) 1 MG tablet Take 1 tablet (1 mg total) by mouth 3 (three) times daily as needed for anxiety.  90 tablet  2  . glucose blood test strip Use as instructed once daily  DX 250.00  50 each  5  . metoprolol succinate (TOPROL-XL) 50 MG 24 hr tablet TAKE 1 TABLET TWICE A DAY  60 tablet  3  . NON FORMULARY Bipap, pressure  16/12      . ONETOUCH DELICA LANCETS MISC To use with one touch ultra meter for once daily testing  DX 250.00  100 each  5  . pantoprazole (PROTONIX) 40 MG tablet Take 1 tablet (40 mg total) by mouth daily.  30 tablet  6  . spironolactone (ALDACTONE) 25 MG tablet TAKE 1 TABLET EVERY DAY  90 tablet  1  . torsemide (DEMADEX) 20 MG tablet Take 1 tablet (20 mg total) by mouth 2 (two) times daily.      . valsartan (DIOVAN) 80 MG tablet Take 120 mg by mouth 2 (two) times daily.      . [DISCONTINUED] pravastatin (PRAVACHOL) 40 MG tablet Take 40 mg by mouth daily.      . [DISCONTINUED] sertraline (ZOLOFT) 50 MG tablet Take 1 tablet (50 mg total) by mouth daily. Take  50mg  daily for 1 week then increase to 100mg  daily  60 tablet  2   Current Facility-Administered Medications  Medication Dose Route Frequency Provider Last Rate Last Dose  . Influenza (>/= 3 years) inactive virus vaccine (FLVIRIN/FLUZONE) injection SUSP 0.5 mL  0.5 mL Intramuscular Once Kerri Perches, MD        Past Medical History  Diagnosis Date  . Chronic systolic heart failure   . Edema   . Atrial fibrillation   . Obesity, unspecified   .  Anxiety state, unspecified   . Psychiatric disorder   . Obstructive sleep apnea   . History of medication noncompliance   . Alcohol abuse   . Shortness of breath   . Migraine     Past Surgical History  Procedure Laterality Date  . Testicle surgery    . Cardiac catheterization      History   Social History  . Marital Status: Married    Spouse Name: N/A    Number of Children: 3  . Years of Education: N/A   Occupational History  . DISABLED    Social History Main Topics  . Smoking status: Current Every Day Smoker -- 1.00 packs/day for 30 years    Types: Cigarettes  . Smokeless tobacco: Never Used     Comment:    Packs/Day: 1/2 PPD  . Alcohol Use: Yes  . Drug Use: No  . Sexually Active: Not on file   Other Topics Concern  . Not on file   Social History Narrative     He smokes about a pack per day and he has been smoking     since he was 47 years of age.  He drinks alcohol occasionally, but he     denies any illicit drug abuse.  He is presently on disability.         Social History Narrative    He smokes about a pack per day and he has been smoking     since he was 47 years of age.  He drinks alcohol occasionally, but he     denies any illicit drug abuse.  He is presently on disability.          Problem Relation Age of Onset  . Hypertension      Family History  . Stroke      Family History  . Diabetes      Family History  . Cancer Mother     brain tumor  . Hypertension Mother   . Diabetes Father   . Heart disease Maternal Grandmother     ROS: no nausea, vomiting; no fever, chills; no melena, hematochezia; no claudication  PHYSICAL EXAM: BP 118/71  Pulse 86  Ht 5' 8.5" (1.74 m)  Wt 429 lb (194.593 kg)  BMI 64.27 kg/m2 GENERAL: 47 year-old male, morbidly obese; NAD HEENT: NCAT, PERRLA, EOMI; sclera clear; no xanthelasma NECK: palpable bilateral carotid pulses, no bruits; no JVD; no TM LUNGS: CTA bilaterally CARDIAC: RRR (S1, S2); no significant murmurs; no rubs or gallops ABDOMEN: soft, protuberant EXTREMETIES: 1+ bilateral, nonpitting peripheral edema SKIN: warm/dry; no obvious rash/lesions MUSCULOSKELETAL: no joint deformity NEURO: no focal deficit; NL affect   EKG:   ASSESSMENT & PLAN:  Systolic CHF, chronic Patient appears euvolemic by history and exam. Results of recent blood work were reviewed, including CXR and BNP levels which were negative for evidence of CHF. The patient is on BiPAP at night, is watching his sodium intake, and is even trying to increase his exercise tolerance. Recommend continuing the current medication regimen, but changing Demadex to 20 twice a day, for more even distribution. Will discontinue supplemental K., given that he is on Aldactone, and check followup BMET on Monday of next week. I also agree  with recent recommendation to decrease valsartan, given his improved BP, and mild associated symptoms of fatigue with the lower readings.  ATRIAL FIBRILLATION, PAROXYSMAL Quiescent by history and exam. Continue low-dose ASA, but with recommendation to initiate anticoagulation therapy, if he were to have recurrent  PAF.  UNSPECIFIED ESSENTIAL HYPERTENSION Much improved. Continue current antihypertensive regimen, but with decrease in valsartan dose to 120 mg daily, as outlined above.  Nonischemic cardiomyopathy EF improved to 35-40%, by echocardiogram, 08/2012 (history of EF 25%). Presumed secondary to EtOH abuse.    Gene Marcelus Dubberly, PAC

## 2013-01-26 NOTE — Telephone Encounter (Signed)
Patient aware.

## 2013-01-26 NOTE — Assessment & Plan Note (Signed)
EF improved to 35-40%, by echocardiogram, 08/2012 (history of EF 25%). Presumed secondary to EtOH abuse.

## 2013-01-26 NOTE — Assessment & Plan Note (Signed)
Much improved. Continue current antihypertensive regimen, but with decrease in valsartan dose to 120 mg daily, as outlined above.

## 2013-01-26 NOTE — Assessment & Plan Note (Signed)
Quiescent by history and exam. Continue low-dose ASA, but with recommendation to initiate anticoagulation therapy, if he were to have recurrent PAF.

## 2013-01-26 NOTE — Assessment & Plan Note (Signed)
Patient appears euvolemic by history and exam. Results of recent blood work were reviewed, including CXR and BNP levels which were negative for evidence of CHF. The patient is on BiPAP at night, is watching his sodium intake, and is even trying to increase his exercise tolerance. Recommend continuing the current medication regimen, but changing Demadex to 20 twice a day, for more even distribution. Will discontinue supplemental K., given that he is on Aldactone, and check followup BMET on Monday of next week. I also agree with recent recommendation to decrease valsartan, given his improved BP, and mild associated symptoms of fatigue with the lower readings.

## 2013-01-27 ENCOUNTER — Encounter: Payer: Self-pay | Admitting: *Deleted

## 2013-02-09 ENCOUNTER — Encounter: Payer: Self-pay | Admitting: Cardiology

## 2013-02-16 ENCOUNTER — Ambulatory Visit: Payer: Medicare Other | Admitting: Cardiology

## 2013-02-18 ENCOUNTER — Telehealth: Payer: Self-pay | Admitting: Family Medicine

## 2013-02-18 DIAGNOSIS — IMO0001 Reserved for inherently not codable concepts without codable children: Secondary | ICD-10-CM

## 2013-02-18 DIAGNOSIS — E119 Type 2 diabetes mellitus without complications: Secondary | ICD-10-CM

## 2013-02-21 NOTE — Telephone Encounter (Signed)
Referral sent 

## 2013-02-23 ENCOUNTER — Telehealth: Payer: Self-pay | Admitting: Family Medicine

## 2013-02-23 NOTE — Telephone Encounter (Signed)
Called and left message for patient to return call.  

## 2013-02-25 NOTE — Telephone Encounter (Signed)
Patient never returned call  

## 2013-04-01 DIAGNOSIS — B351 Tinea unguium: Secondary | ICD-10-CM | POA: Diagnosis not present

## 2013-04-01 DIAGNOSIS — M79609 Pain in unspecified limb: Secondary | ICD-10-CM | POA: Diagnosis not present

## 2013-04-13 ENCOUNTER — Other Ambulatory Visit: Payer: Self-pay | Admitting: Family Medicine

## 2013-04-13 ENCOUNTER — Encounter: Payer: Self-pay | Admitting: Cardiology

## 2013-04-13 ENCOUNTER — Ambulatory Visit (INDEPENDENT_AMBULATORY_CARE_PROVIDER_SITE_OTHER): Payer: Medicare Other | Admitting: Cardiology

## 2013-04-13 VITALS — BP 112/71 | HR 76 | Ht 68.5 in | Wt >= 6400 oz

## 2013-04-13 DIAGNOSIS — I4891 Unspecified atrial fibrillation: Secondary | ICD-10-CM

## 2013-04-13 DIAGNOSIS — I5022 Chronic systolic (congestive) heart failure: Secondary | ICD-10-CM | POA: Diagnosis not present

## 2013-04-13 DIAGNOSIS — F411 Generalized anxiety disorder: Secondary | ICD-10-CM | POA: Diagnosis not present

## 2013-04-13 DIAGNOSIS — I509 Heart failure, unspecified: Secondary | ICD-10-CM

## 2013-04-13 DIAGNOSIS — I10 Essential (primary) hypertension: Secondary | ICD-10-CM

## 2013-04-13 MED ORDER — LOSARTAN POTASSIUM 25 MG PO TABS: 25.0000 mg | ORAL_TABLET | Freq: Two times a day (BID) | ORAL | Status: DC

## 2013-04-13 NOTE — Patient Instructions (Signed)
   Decrease Losartan to 25mg  twice a day   Continue all other current medications. Follow up in  3 months

## 2013-04-14 NOTE — Progress Notes (Signed)
Patient ID: [name], male   DOB: Jul 25, 1966, 47 y.o.   MRN: [id number] PCP: Dr. Jeanice Lim  47 yo with history of nonischemic cardiomyopathy presents for cardiology followup.  His last echo showed EF 35-40% in 9/13, which was improved.  The cardiomyopathy is likely due to ETOH abuse, he has quit.  Past cath showed no significant coronary disease.  He is morbidly obese.  Main complaint today is that patient is "agitated."  He has been having panic attacks.  Klonopin is not helping as well as in the past.  He is very anxious.  He is tired and fatigued with no energy.  He has not been going to the gym.  No chest pain.  No dyspnea, just gets tired.  He is not taking losartan because he feels like his BP has been too low.  He denies lightheadedness.  He is still smoking.   ECG: NSR, borderline LBBB  Labs (5/13): K 4.1, creatinine 1.05 Labs (1/14): K 3.8, creatinine 1.16, BNP 54 Labs (2/14): K 3.7, creatinine 1.11, BNP 28  PMH: 1. Nonischemic cardiomyopathy: Prior cath with no significant CAD.  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  Prior echoes with EF as low as 25%.  Last echo (9/13) with EF 35-40%, moderate to severe LV dilation, diffuse hypokinesis, mild MR.  2. HTN 3. OSA: on Bipap 4. Morbid obesity 5. Paroxysmal atrial fibrillation: Not documented recently.  He is not on anticoagulation.  6. Smoker.  7. Anxiety/panic attacks  SH: On disability, lives in [place], prior ETOH abuse but now dose not drink, no drugs, smokes 1 ppd.  Married with a son.   FH: No premature CAD.    ROS: All systems reviewed and negative except as per HPI.   BP 112/71  Pulse 76  Ht 5' 8.5" (1.74 m)  Wt 432 lb 1.3 oz (195.99 kg)  BMI 64.73 kg/m2 General: NAD, obese.  Neck: Thick, JVP difficult, no thyromegaly or thyroid nodule.  Lungs: Clear to auscultation bilaterally with normal respiratory effort. CV: Nondisplaced PMI.  Heart regular S1/S2, no S3/S4, no murmur.   Trace ankle edema.  No carotid bruit.  Normal pedal pulses.  Abdomen: Soft, nontender, unable to palpate liver edge, mild distention.  Neurologic: Alert and oriented x 3.  Psych: Normal affect. Extremities: No clubbing or cyanosis.   Assessment/Plan: 1. Chronic systolic CHF: Stable volume on exam, stable weight.  He denies dyspnea but tires easily.  He is concerned that his BP runs too low.  It is reasonable in the office today. - Continue current Toprol XL and spironolactone.  - Instead of taking losartan 50 mg once daily, I will have him take 25 mg bid to minimize BP fluctuations.  - Continue current torsemide and KCl.   - EF not in range for ICD at this point (not < 35%).   - Continue to avoid ETOH.  2. Obesity: Continue efforts at weight loss.   3. Smoking: Still smoking.  Not using Wellbutrin. 4. Anxiety/Panic: This seems to be his major problem at this time.  He will followup with his PCP.  5. Paroxysmal atrial fibrillation: No recent atrial fibrillation noted.  If has recurrence, would need anticoagulation.   Marca Ancona 04/14/2013

## 2013-04-15 DIAGNOSIS — B351 Tinea unguium: Secondary | ICD-10-CM | POA: Diagnosis not present

## 2013-04-15 DIAGNOSIS — M79609 Pain in unspecified limb: Secondary | ICD-10-CM | POA: Diagnosis not present

## 2013-04-19 ENCOUNTER — Ambulatory Visit (INDEPENDENT_AMBULATORY_CARE_PROVIDER_SITE_OTHER): Payer: Medicare Other | Admitting: Family Medicine

## 2013-04-19 ENCOUNTER — Encounter: Payer: Self-pay | Admitting: Family Medicine

## 2013-04-19 VITALS — BP 140/84 | HR 80 | Resp 18 | Ht 68.0 in | Wt >= 6400 oz

## 2013-04-19 DIAGNOSIS — M549 Dorsalgia, unspecified: Secondary | ICD-10-CM

## 2013-04-19 DIAGNOSIS — F39 Unspecified mood [affective] disorder: Secondary | ICD-10-CM

## 2013-04-19 DIAGNOSIS — F17200 Nicotine dependence, unspecified, uncomplicated: Secondary | ICD-10-CM

## 2013-04-19 DIAGNOSIS — F329 Major depressive disorder, single episode, unspecified: Secondary | ICD-10-CM

## 2013-04-19 DIAGNOSIS — I10 Essential (primary) hypertension: Secondary | ICD-10-CM

## 2013-04-19 DIAGNOSIS — Z720 Tobacco use: Secondary | ICD-10-CM

## 2013-04-19 DIAGNOSIS — F411 Generalized anxiety disorder: Secondary | ICD-10-CM

## 2013-04-19 DIAGNOSIS — E119 Type 2 diabetes mellitus without complications: Secondary | ICD-10-CM | POA: Diagnosis not present

## 2013-04-19 MED ORDER — VENLAFAXINE HCL ER 37.5 MG PO CP24: 37.5000 mg | ORAL_CAPSULE | Freq: Every day | ORAL | Status: DC

## 2013-04-19 NOTE — Progress Notes (Signed)
  Subjective:    Patient ID: [name], male    DOB: 1966-01-24, 47 y.o.   MRN: [id number]  HPI  Patient here to follow chronic medical problems. He was sent to my office by his cardiologist because of his anxiety. He states he is more anxious and having more panic attacks he is very angry when asked about his wife he cursed and states that he doesn't care about her and they pretty much lives separate lives. His son is going through a lot of testing secondary to behavioral problems as well as delay and he is currently 45 years old. He's not want to get up and do anything he has not been exercising and that makes him more depressed. He is using Klonopin upwards of 3 times a day and he is upset about this. He tells me in the past he was on Thorazine and what sounds benztropine and some other medication since childhood he also recalls he was given this medication when he was in the mental observation when he was incarcerated in the past. He tells me that he was arrested multiple times for drug dealing and quit when he received custody of his son about a year and half ago.  Review of Systems   GEN- denies fatigue, fever, weight loss,weakness, recent illness HEENT- denies eye drainage, change in vision, nasal discharge, CVS- denies chest pain, palpitations RESP- denies SOB, cough, wheeze ABD- denies N/V, change in stools, abd pain GU- denies dysuria, hematuria, dribbling, incontinence MSK- + joint pain, muscle aches, injury Neuro- denies headache, dizziness, syncope, seizure activity      Objective:   Physical Exam  GEN- NAD, alert and oriented x3, morbidly obese HEENT- PERRL, EOMI, non injected sclera, pink conjunctiva, MMM, oropharynx clear Neck- Supple, good ROM CVS- RRR, no murmur RESP-CTAB Back- Spine NT, mild spasm thoracic region. Neg SLR, + lipoma center of back EXT-1+ pitting  edema Pulses- Radial, DP- 2+ Psych- Depressed affect, not overly anxious, no SI, good eye  contact        Assessment & Plan:

## 2013-04-19 NOTE — Assessment & Plan Note (Signed)
Needs to work on tobacco cessation

## 2013-04-19 NOTE — Patient Instructions (Signed)
Start effexor daily for anxiety Continue klonopin Continue all other medications Referral to psychiatry  F/U 4 weeks [employer]

## 2013-04-19 NOTE — Assessment & Plan Note (Signed)
Last A1c shows diet control in February 6.1%

## 2013-04-19 NOTE — Assessment & Plan Note (Signed)
He continues to have back and neck pain along with headaches. He declined any medications today we'll use a heating pad

## 2013-04-19 NOTE — Assessment & Plan Note (Signed)
Currently unmotivated to exercise or change his eating habits. He has gained weight

## 2013-04-19 NOTE — Assessment & Plan Note (Signed)
Per above, start effexor

## 2013-04-19 NOTE — Assessment & Plan Note (Signed)
Blood pressure looks okay today he is a little elevated however he has not taken any medication he had recent changes by his cardiologist

## 2013-04-19 NOTE — Assessment & Plan Note (Addendum)
He has a lot of anxiety describes panic attacks is also depressed. Question his history he may have some actual personality disorder as well with his anger. Will start him on Effexor we will have to go very slow with the medication and he often will stop his medications and take them how he sees fit. I will send him to psychiatry think he needs a complete evaluation for more proper diagnosis.He agrees to go to psychiatry and participate in therapy

## 2013-04-22 ENCOUNTER — Telehealth: Payer: Self-pay | Admitting: Pulmonary Disease

## 2013-04-22 NOTE — Telephone Encounter (Signed)
[name], pt needs ov with me to troubleshoot his bipap device. Have pt bring his machine with him for download.

## 2013-06-25 DIAGNOSIS — I509 Heart failure, unspecified: Secondary | ICD-10-CM | POA: Diagnosis not present

## 2013-06-25 DIAGNOSIS — M19079 Primary osteoarthritis, unspecified ankle and foot: Secondary | ICD-10-CM | POA: Diagnosis not present

## 2013-06-25 DIAGNOSIS — Z79899 Other long term (current) drug therapy: Secondary | ICD-10-CM | POA: Diagnosis not present

## 2013-06-25 DIAGNOSIS — F17200 Nicotine dependence, unspecified, uncomplicated: Secondary | ICD-10-CM | POA: Diagnosis not present

## 2013-06-25 DIAGNOSIS — M109 Gout, unspecified: Secondary | ICD-10-CM | POA: Diagnosis not present

## 2013-06-25 DIAGNOSIS — I10 Essential (primary) hypertension: Secondary | ICD-10-CM | POA: Diagnosis not present

## 2013-06-29 ENCOUNTER — Encounter: Payer: Self-pay | Admitting: Family Medicine

## 2013-06-29 ENCOUNTER — Inpatient Hospital Stay: Payer: Self-pay | Admitting: Family Medicine

## 2013-06-29 ENCOUNTER — Ambulatory Visit (INDEPENDENT_AMBULATORY_CARE_PROVIDER_SITE_OTHER): Payer: Medicare Other | Admitting: Family Medicine

## 2013-06-29 VITALS — BP 132/88 | HR 70 | Temp 97.1°F | Resp 18 | Wt >= 6400 oz

## 2013-06-29 DIAGNOSIS — F329 Major depressive disorder, single episode, unspecified: Secondary | ICD-10-CM

## 2013-06-29 DIAGNOSIS — F411 Generalized anxiety disorder: Secondary | ICD-10-CM

## 2013-06-29 DIAGNOSIS — E781 Pure hyperglyceridemia: Secondary | ICD-10-CM | POA: Insufficient documentation

## 2013-06-29 DIAGNOSIS — M109 Gout, unspecified: Secondary | ICD-10-CM | POA: Diagnosis not present

## 2013-06-29 DIAGNOSIS — I10 Essential (primary) hypertension: Secondary | ICD-10-CM | POA: Diagnosis not present

## 2013-06-29 MED ORDER — HYDROCODONE-ACETAMINOPHEN 5-325 MG PO TABS: 1.0000 | ORAL_TABLET | Freq: Four times a day (QID) | ORAL | Status: DC | PRN

## 2013-06-29 MED ORDER — VENLAFAXINE HCL ER 75 MG PO CP24: 150.0000 mg | ORAL_CAPSULE | Freq: Every day | ORAL | Status: DC

## 2013-06-29 MED ORDER — CLONAZEPAM 1 MG PO TABS: 1.0000 mg | ORAL_TABLET | Freq: Three times a day (TID) | ORAL | Status: DC | PRN

## 2013-06-29 NOTE — Assessment & Plan Note (Signed)
He has stopped ARB Will alert cardiology  For now continue BB, diuretic combination. He is often non compliant with medications. I think treating his anxiety would help this but he does not follow up on regular basis in order to do so

## 2013-06-29 NOTE — Progress Notes (Signed)
  Subjective:    Patient ID: [name], male    DOB: Jan 16, 1966, 47 y.o.   MRN: [id number]  HPI Pt here to f/u ER visit to [facility] for Gout prescribed naprosyn and percocet, has not taken naprosyn, took a few of the percocet he was afraid naprosyn would worsen his heart failure or cause Heart attack HTN/CHF- not taking losartan states it makes him nauseous and feel funny so stopped a few months ago. He is taking all other medications Headaches- Has tension headache past few days, he is separating from wife, also dealing with gout pain and his son is having some learning difficulties. No change in vision, no N/V associated Anxiety- unchanged, did not f/u after last visit, currently on effexor 37.5mg , took last pill yesterday , has not seen psychiatrist states he did not hear back   Review of Systems  GEN- denies fatigue, fever, weight loss,weakness, recent illness HEENT- denies eye drainage, change in vision, nasal discharge, CVS- denies chest pain, palpitations RESP- denies SOB, cough, wheeze ABD- denies N/V, change in stools, abd pain GU- denies dysuria, hematuria, dribbling, incontinence MSK- + joint pain, muscle aches, injury Neuro- + headache, dizziness, syncope, seizure activity      Objective:   Physical Exam GEN- NAD, alert and oriented x3, morbidly obese HEENT- PERRL, EOMI, non injected sclera, pink conjunctiva, MMM, oropharynx clear, no nystagmus, fundus benign Neck- Supple, good ROM CVS- RRR, no murmur RESP-CTAB EXT-1+ pitting  Edema, swelling and tenderness Left Great toe, no erythema of toe, decreased ROM Pulses- Radial, DP- 2+ Psych- Depressed affect, not overly anxious, no SI, well groomed Neuro- CNII-XII in tact,        Assessment & Plan:

## 2013-06-29 NOTE — Patient Instructions (Signed)
Refer to psychiatry  Increase effexor to 75mg  - take 1 tablet daily  Take the naprosyn twice a day  Take the pain medication New referral to psychiatry  F/U 6 weeks

## 2013-06-29 NOTE — Assessment & Plan Note (Signed)
Obtain records from [facility] the naprosyn BID, hydrocodone refilled short term He has been eating increase on red meats- discussed foods to avoid, avoid ETOH

## 2013-06-29 NOTE — Assessment & Plan Note (Signed)
Increase effexor to 75mg  ER daily , plan to taper up to 150mg , he is very reluctant to take medications, so I am trending upward slowly He still needs psychiatry and therapy, [facility] in [place] is no longer taking pt due to physician leaving, so I will send a new referral

## 2013-06-29 NOTE — Assessment & Plan Note (Signed)
Per above increase effexor

## 2013-07-05 ENCOUNTER — Telehealth: Payer: Self-pay | Admitting: Family Medicine

## 2013-07-05 MED ORDER — METOPROLOL SUCCINATE ER 50 MG PO TB24: ORAL_TABLET | ORAL | Status: DC

## 2013-07-05 MED ORDER — PANTOPRAZOLE SODIUM 40 MG PO TBEC: 40.0000 mg | DELAYED_RELEASE_TABLET | Freq: Every day | ORAL | Status: DC

## 2013-07-05 NOTE — Telephone Encounter (Signed)
Called pharmacy and pharmacist stated the reason it was not covered is because he is taking to capsules a day instead of one..pharmacist said that insurance would cover if it was 150mg  one capsule instead of two, I overrode and told him to do 150mg  1 capsule daily instead of two.

## 2013-07-05 NOTE — Telephone Encounter (Signed)
Meds refilled.

## 2013-07-05 NOTE — Telephone Encounter (Signed)
Please call pharmacy, we should not have any problem with his effexor being covered He should not start the wellbutrin

## 2013-07-06 ENCOUNTER — Other Ambulatory Visit: Payer: Self-pay | Admitting: *Deleted

## 2013-07-06 ENCOUNTER — Telehealth: Payer: Self-pay | Admitting: Family Medicine

## 2013-07-06 MED ORDER — TORSEMIDE 20 MG PO TABS: 20.0000 mg | ORAL_TABLET | Freq: Every day | ORAL | Status: DC

## 2013-07-06 NOTE — Telephone Encounter (Signed)
This was changed to 150 mg 1 QD. This was called to the pharmacy 07/05/13.

## 2013-07-27 DIAGNOSIS — F331 Major depressive disorder, recurrent, moderate: Secondary | ICD-10-CM | POA: Diagnosis not present

## 2013-08-10 ENCOUNTER — Ambulatory Visit (INDEPENDENT_AMBULATORY_CARE_PROVIDER_SITE_OTHER): Payer: Medicare Other | Admitting: Family Medicine

## 2013-08-10 ENCOUNTER — Encounter: Payer: Self-pay | Admitting: Family Medicine

## 2013-08-10 VITALS — BP 130/80 | HR 80 | Temp 97.9°F | Resp 18

## 2013-08-10 DIAGNOSIS — F329 Major depressive disorder, single episode, unspecified: Secondary | ICD-10-CM

## 2013-08-10 DIAGNOSIS — F411 Generalized anxiety disorder: Secondary | ICD-10-CM | POA: Diagnosis not present

## 2013-08-10 DIAGNOSIS — M109 Gout, unspecified: Secondary | ICD-10-CM

## 2013-08-10 DIAGNOSIS — I10 Essential (primary) hypertension: Secondary | ICD-10-CM | POA: Diagnosis not present

## 2013-08-10 DIAGNOSIS — F1010 Alcohol abuse, uncomplicated: Secondary | ICD-10-CM | POA: Insufficient documentation

## 2013-08-10 LAB — COMPREHENSIVE METABOLIC PANEL
Albumin: 3.9 g/dL (ref 3.5–5.2)
BUN: 15 mg/dL (ref 6–23)
Calcium: 9.1 mg/dL (ref 8.4–10.5)
Chloride: 105 mEq/L (ref 96–112)
Glucose, Bld: 111 mg/dL — ABNORMAL HIGH (ref 70–99)
Potassium: 3.9 mEq/L (ref 3.5–5.3)

## 2013-08-10 LAB — CBC WITH DIFFERENTIAL/PLATELET
HCT: 44.1 % (ref 39.0–52.0)
Hemoglobin: 15.5 g/dL (ref 13.0–17.0)
Lymphocytes Relative: 49 % — ABNORMAL HIGH (ref 12–46)
Lymphs Abs: 4.4 10*3/uL — ABNORMAL HIGH (ref 0.7–4.0)
Monocytes Absolute: 0.7 10*3/uL (ref 0.1–1.0)
Monocytes Relative: 8 % (ref 3–12)
Neutro Abs: 3.7 10*3/uL (ref 1.7–7.7)
WBC: 9.1 10*3/uL (ref 4.0–10.5)

## 2013-08-10 LAB — URIC ACID: Uric Acid, Serum: 8.8 mg/dL — ABNORMAL HIGH (ref 4.0–7.8)

## 2013-08-10 NOTE — Progress Notes (Signed)
  Subjective:    Patient ID: ESPN ZEMAN, male    DOB: Feb 05, 1966, 48 y.o.   MRN: [id number]  HPI  Patient here for 6 week followup. His Effexor was increased to 75 mg daily he was then given appointment with psychiatry and he was increased to 150 mg but he never did to the step. He is very upset with a psychiatrist he is seeing states that she did not listen to him or given any diagnosis. He also had his therapy appointment rescheduled. He tells me that the medication makes him feel worse and he is crazy thoughts and it caused him to drink a significant amount of alcohol this weekend as well as last night. Note that he did not tell the psychiatrist any of these side effects when he was at the appointment a few weeks ago.  He also continues to have left foot pain diagnosed with gout. He was seen by podiatry who prescribed him Medrol Dosepak one month ago however he still has not taken but continues to complain of pain.   Review of Systems  GEN- denies fatigue, fever, weight loss,weakness, recent illness HEENT- denies eye drainage, change in vision, nasal discharge, CVS- denies chest pain, palpitations RESP- denies SOB, cough, wheeze ABD- denies N/V, change in stools, abd pain MSK- + joint pain, muscle aches, injury Neuro- denies headache, dizziness, syncope, seizure activity      Objective:   Physical Exam GEN- NAD, alert and oriented x3, morbidly obese CVS- RRR, no murmur RESP-CTAB EXT-pedal  Edema, swelling and tenderness Left Great toe, no erythema of toe, Pulses- Radial, DP- 2+ Psych- Depressed affect, not  anxious, no SI,          Assessment & Plan:

## 2013-08-10 NOTE — Assessment & Plan Note (Signed)
Blood pressure is stable today no change the medication

## 2013-08-10 NOTE — Assessment & Plan Note (Signed)
Severe anxiety and depression I think that he has bipolar disease. I will speak with her psychiatrist regarding his recommendations and sees a she will start him on a mood stabilizer. Due to the side effects he is explaining I will go ahead and just stopped the Effexor

## 2013-08-10 NOTE — Assessment & Plan Note (Signed)
I've advised him  to take the prednisone given by podiatry whom he just saw 4 weeks ago. His ear gas will be checked again today

## 2013-08-10 NOTE — Assessment & Plan Note (Signed)
I discussed with him the importance of not drinking alcohol with his cardiomyopathy as well as his medications and his and treated mood disorder. He was given multiple excuses to why he was drinking also did describes situations where he was trying to get himself into trouble and driving while intoxicated. I honestly do not think I can truly reason with him

## 2013-08-10 NOTE — Assessment & Plan Note (Signed)
Uncontrolled, stop effexor, he is giving many excuses for his health and why he does not want to take meds and his behaviors.

## 2013-08-10 NOTE — Patient Instructions (Signed)
Start the prednisone Stop the effexor  Do not DRINK ALCOHOL We will call with lab results I will call Dr. Elba Barman F/U 3 months

## 2013-08-16 ENCOUNTER — Encounter: Payer: Self-pay | Admitting: Cardiology

## 2013-08-16 ENCOUNTER — Ambulatory Visit (INDEPENDENT_AMBULATORY_CARE_PROVIDER_SITE_OTHER): Payer: Medicare Other | Admitting: Cardiology

## 2013-08-16 VITALS — BP 127/85 | HR 70 | Ht 68.0 in | Wt >= 6400 oz

## 2013-08-16 DIAGNOSIS — I5022 Chronic systolic (congestive) heart failure: Secondary | ICD-10-CM

## 2013-08-16 DIAGNOSIS — I4891 Unspecified atrial fibrillation: Secondary | ICD-10-CM | POA: Diagnosis not present

## 2013-08-16 NOTE — Progress Notes (Signed)
Clinical Summary [name] is a 47 y.o.male   1. NICM: thought to be EtOH related. Previous cath w/ patent coronaries - Last echo 9/13 LVEF 35-40%, which was improved (previously 25%). - previously stopped taking his losartan b/c of low bps, at last visit was recommended to take losartan 25mg  bid  - denies any DOE. Was walking on treadmill, could walk 30 minutes. Chronic 3-4 pillow orthopnea, no PND. Occas LE edema - reports he started drinking again due to worsening depression - limiting sodium intake, avoiding NSAIDs - compliant w/ meds: ASA, Toprol (taking once a day though prescribed twice a day), spironlactione. Tried losartan 25mg  qday w/ HAs, stopped.     2. Afib - seems to have been isolated episode, review of clinic notes report no recurrence, has not been on anticoag.  - had an episode last week and today. Feels like heart is racing, w/ SOB, + lightheaded. Notes increase in frequency and severity over the last few weeks.   3. OSA: reports compliant w/ his bipap machine   Past Medical History  Diagnosis Date  . Chronic systolic heart failure   . Edema   . Atrial fibrillation   . Obesity, unspecified   . Anxiety state, unspecified   . Psychiatric disorder   . Obstructive sleep apnea   . History of medication noncompliance   . Alcohol abuse   . Shortness of breath   . Migraine      Allergies  Allergen Reactions  . Ace Inhibitors     REACTION: throat swelling  . Buspirone     dizziness     Current Outpatient Prescriptions  Medication Sig Dispense Refill  . aspirin 81 MG tablet Take 81 mg by mouth daily.        . clonazePAM (KLONOPIN) 1 MG tablet Take 1 tablet (1 mg total) by mouth 3 (three) times daily as needed for anxiety.  90 tablet  2  . HYDROcodone-acetaminophen (NORCO) 5-325 MG per tablet Take 1 tablet by mouth every 6 (six) hours as needed for pain.  30 tablet  0  . losartan (COZAAR) 25 MG tablet Take 1 tablet (25 mg total) by mouth 2 (two) times  daily.  60 tablet  6  . methylPREDNISolone (MEDROL) 4 MG tablet Take 4 mg by mouth daily.      . metoprolol succinate (TOPROL-XL) 50 MG 24 hr tablet TAKE 1 TABLET TWICE A DAY  180 tablet  2  . NON FORMULARY Bipap, pressure 16/12      . pantoprazole (PROTONIX) 40 MG tablet Take 1 tablet (40 mg total) by mouth daily.  90 tablet  1  . spironolactone (ALDACTONE) 25 MG tablet TAKE 1 TABLET EVERY DAY  90 tablet  1  . torsemide (DEMADEX) 20 MG tablet Take 1 tablet (20 mg total) by mouth daily.  90 tablet  3  . [DISCONTINUED] pravastatin (PRAVACHOL) 40 MG tablet Take 40 mg by mouth daily.      . [DISCONTINUED] sertraline (ZOLOFT) 50 MG tablet Take 1 tablet (50 mg total) by mouth daily. Take  50mg  daily for 1 week then increase to 100mg  daily  60 tablet  2   Current Facility-Administered Medications  Medication Dose Route Frequency Provider Last Rate Last Dose  . Influenza (>/= 3 years) inactive virus vaccine (FLVIRIN/FLUZONE) injection SUSP 0.5 mL  0.5 mL Intramuscular Once Kerri Perches, MD         Past Surgical History  Procedure Laterality Date  . Testicle surgery    .  Cardiac catheterization       Allergies  Allergen Reactions  . Ace Inhibitors     REACTION: throat swelling  . Buspirone     dizziness      Family History  Problem Relation Age of Onset  . Hypertension      Family History  . Stroke      Family History  . Diabetes      Family History  . Cancer Mother     brain tumor  . Hypertension Mother   . Diabetes Father   . Heart disease Maternal Grandmother      Social History [name] reports that he has been smoking Cigarettes.  He has a 30 pack-year smoking history. He has never used smokeless tobacco. [name] reports that  drinks alcohol.   Review of Systems 12 point ROS negative other than reported in HPI  Physical Examination p 70 bp 127/85  Gen: resting comfortably, NAD HEENT: no scleral icterus, pupils equal round and reactive, no palptable  cervical adenopathy CV: RRR, no m/r/g, no JVD, no carotid bruits Pulm: CTAB Abd: obese, soft, NT, ND NABS, no hepatosplenomegaly Ext: warm, no edema.  Skin: warm, no rash Neuro: A&Ox3, no focal deficits    Diagnostic Studies Echo 08/2012: LVEF 35-40%, moderate LVH, mild MR, mode LAE,     Assessment and Plan  1. NICM: thought to be possibly EtOH induced, LVEF initially increased by still mildly reduced. - patient endorses recent binge drinking episode due to worsening depression, this is being worked on by his PCP. Currently only having 1-2 beers per day - counseled extensively on risks of EtOH on his heart - counseled extensively on medication compliance, including taking his Toprol XL (taking bid to ease sideffects). He was not able to tolerate an ARB due to HAs per his report, and he has an ACE-I listed allergy - will follow his symptoms after increased compliance w/ beta blocker, goal would be to further titrate Toprol XL and consider starting bidil pending his medication tolerance  2. Afib - per notes and his report had prior episodes some years ago, but seems to have resolved. Not sure if was related to EtOH or not. Currently reporting palpitation episodes concerning for afib, could be exacerbated by recent EtOH and not taking his Toprol XL as prescribed -14 day event monitor to look for afib, if present will need anticoagulation and further titration of beta blocker  3. Obestiry: counseled on importance of exercise and aerobic exercise, less emphasis on strength training.  4. OSA: continue bipap  F/u 1 month   Antoine Poche, M.D., F.A.C.C.

## 2013-08-16 NOTE — Patient Instructions (Signed)
Your physician recommends that you schedule a follow-up appointment in: 1 month with Dr. Wyline Mood. This appointment will be scheduled today before you leave.  Your physician recommends that you continue on your current medications as directed. Please refer to the Current Medication list given to you today.  Your physician has recommended that you wear an event monitor for 14 days. Event monitors are medical devices that record the heart's electrical activity. Doctors most often [place] these monitors to diagnose arrhythmias. Arrhythmias are problems with the speed or rhythm of the heartbeat. The monitor is a small, portable device. You can wear one while you do your normal daily activities. This is usually used to diagnose what is causing palpitations/syncope (passing out). This monitor will be mailed to you at your home address.

## 2013-08-16 NOTE — Progress Notes (Signed)
Going to [employer] (therapist) in [place], referred by Dr. Jeanice Lim.

## 2013-08-17 DIAGNOSIS — F331 Major depressive disorder, recurrent, moderate: Secondary | ICD-10-CM | POA: Diagnosis not present

## 2013-08-18 ENCOUNTER — Telehealth: Payer: Self-pay | Admitting: Cardiology

## 2013-08-18 DIAGNOSIS — I4891 Unspecified atrial fibrillation: Secondary | ICD-10-CM

## 2013-08-18 NOTE — Telephone Encounter (Signed)
[name] needs to discuss a new medication that was given to him yesterday by another Doctor. Risperidone 1 mg . Wants to verify with Dr. Wyline Mood if it is ok to take this medication With his heart conditions.

## 2013-08-18 NOTE — Telephone Encounter (Signed)
Please advise 

## 2013-08-22 NOTE — Telephone Encounter (Signed)
Risperdal is ok to take from a heart standpoint. Please inform patient

## 2013-08-22 NOTE — Telephone Encounter (Signed)
Pt informed

## 2013-08-23 ENCOUNTER — Telehealth: Payer: Self-pay | Admitting: Cardiology

## 2013-08-23 DIAGNOSIS — F331 Major depressive disorder, recurrent, moderate: Secondary | ICD-10-CM | POA: Diagnosis not present

## 2013-08-23 NOTE — Telephone Encounter (Signed)
Received notice from [facility] for arrhythmia event 08/12/13 at 3pm, patient had 8 beat run of NSVT. He has a known history of non-ischemic cardiomyopathy. Called patient, he reports he has not been taking his Toprol bid as prescribed. Counseled him to take bid to help suppress these abnormal rhythms we are seeing. He states he will, and will continue event monitor.       Dina Rich MD

## 2013-09-02 ENCOUNTER — Telehealth: Payer: Self-pay | Admitting: Family Medicine

## 2013-09-02 MED ORDER — SPIRONOLACTONE 25 MG PO TABS: ORAL_TABLET | ORAL | Status: DC

## 2013-09-02 MED ORDER — TORSEMIDE 20 MG PO TABS: 20.0000 mg | ORAL_TABLET | Freq: Every day | ORAL | Status: DC

## 2013-09-02 MED ORDER — METOPROLOL SUCCINATE ER 50 MG PO TB24: ORAL_TABLET | ORAL | Status: DC

## 2013-09-02 NOTE — Telephone Encounter (Signed)
Pt is picking up script at nearby pharmacy

## 2013-09-02 NOTE — Telephone Encounter (Signed)
Pt called back he is at hospital with his mother in [place], he has left ALL his medications at home and wants to know if you would be ok with calling in 1 pill each to pharmacy where he is and he will pay out of pocket for them. Klonopin,,metoprolol, fluid pill, trazadone.

## 2013-09-02 NOTE — Telephone Encounter (Signed)
Left message with pt to return my call °

## 2013-09-02 NOTE — Telephone Encounter (Signed)
You will need to call which ever pharmacy he has close to him and see if they can do this for him.  You can go ahead and give him the supply

## 2013-09-07 DIAGNOSIS — F331 Major depressive disorder, recurrent, moderate: Secondary | ICD-10-CM | POA: Diagnosis not present

## 2013-09-16 DIAGNOSIS — M109 Gout, unspecified: Secondary | ICD-10-CM | POA: Diagnosis not present

## 2013-09-21 ENCOUNTER — Ambulatory Visit: Payer: Medicare Other | Admitting: Cardiology

## 2013-09-26 ENCOUNTER — Ambulatory Visit (INDEPENDENT_AMBULATORY_CARE_PROVIDER_SITE_OTHER): Payer: Medicare Other | Admitting: Cardiology

## 2013-09-26 ENCOUNTER — Encounter: Payer: Self-pay | Admitting: Cardiology

## 2013-09-26 VITALS — BP 117/79 | HR 71 | Ht 68.0 in | Wt >= 6400 oz

## 2013-09-26 DIAGNOSIS — I472 Ventricular tachycardia: Secondary | ICD-10-CM

## 2013-09-26 DIAGNOSIS — I428 Other cardiomyopathies: Secondary | ICD-10-CM

## 2013-09-26 DIAGNOSIS — R002 Palpitations: Secondary | ICD-10-CM | POA: Diagnosis not present

## 2013-09-26 MED ORDER — NICOTINE 21 MG/24HR TD PT24: 1.0000 | MEDICATED_PATCH | TRANSDERMAL | Status: DC

## 2013-09-26 MED ORDER — METOPROLOL SUCCINATE ER 50 MG PO TB24: ORAL_TABLET | ORAL | Status: DC

## 2013-09-26 MED ORDER — NICOTINE 7 MG/24HR TD PT24: 1.0000 | MEDICATED_PATCH | TRANSDERMAL | Status: DC

## 2013-09-26 MED ORDER — NICOTINE 14 MG/24HR TD PT24: 1.0000 | MEDICATED_PATCH | TRANSDERMAL | Status: DC

## 2013-09-26 MED ORDER — METOPROLOL SUCCINATE ER 25 MG PO TB24: 50.0000 mg | ORAL_TABLET | Freq: Two times a day (BID) | ORAL | Status: DC

## 2013-09-26 MED ORDER — METOPROLOL SUCCINATE ER 50 MG PO TB24: 75.0000 mg | ORAL_TABLET | Freq: Two times a day (BID) | ORAL | Status: DC

## 2013-09-26 NOTE — Patient Instructions (Signed)
Your physician recommends that you schedule a follow-up appointment in: 1 month fu with Dr. Wyline Mood. This appointment will be scheduled today before you leave.  Your physician has recommended you make the following change in your medication:  Start Nicotine patch. Step 1: 21 MG patch 1 patch every day for 6 weeks. Step 2: 14 MG patch 1 patch every day for 2 weeks. Step 3: 7 MG patch 1 patch every day for 2 weeks.  Increase Toprol XL 75 MG twice daily  Continue all other medications the same.

## 2013-09-26 NOTE — Progress Notes (Signed)
Clinical Summary Mr. Brumett is a 47 y.o.male seen today for follow up for the following problems.   1. NICM: thought to be EtOH related. Previous cath w/ patent coronaries  - Last echo 9/13 LVEF 35-40%, which was improved (previously 25%).  - previously stopped taking his losartan b/c of low bps, at last visit was recommended to take losartan 25mg  bid but caused headaches so stopped taking. Headaches resolved. Reports was valsartan and tolerated ok, but states insurance won't cover.  - mixed compliance with Toprol XL, now taking daily.   2. Afib  - seems to have been isolated episode, review of clinic notes report no recurrence, has not been on anticoag.  - no symptomatic episodes noted on recent event monitor  3. OSA: reports compliant w/ his bipap machine   4. Palpitations - monitor showed symptomatic PVCs, one 3 beat run and one 7 beat run of SVT. - no syncope or presyncope     Past Medical History  Diagnosis Date  . Chronic systolic heart failure   . Edema   . Atrial fibrillation   . Obesity, unspecified   . Anxiety state, unspecified   . Psychiatric disorder   . Obstructive sleep apnea   . History of medication noncompliance   . Alcohol abuse   . Shortness of breath   . Migraine      Allergies  Allergen Reactions  . Ace Inhibitors     REACTION: throat swelling  . Buspirone     dizziness     Current Outpatient Prescriptions  Medication Sig Dispense Refill  . aspirin 81 MG tablet Take 81 mg by mouth daily.        . clonazePAM (KLONOPIN) 1 MG tablet Take 1 tablet (1 mg total) by mouth 3 (three) times daily as needed for anxiety.  90 tablet  2  . HYDROcodone-acetaminophen (NORCO) 5-325 MG per tablet Take 1 tablet by mouth every 6 (six) hours as needed for pain.  30 tablet  0  . methylPREDNISolone (MEDROL) 4 MG tablet Take 4 mg by mouth daily.      . metoprolol succinate (TOPROL-XL) 50 MG 24 hr tablet TAKE 1 TABLET TWICE A DAY  1 tablet  0  . NON FORMULARY  Bipap, pressure 16/12      . pantoprazole (PROTONIX) 40 MG tablet Take 1 tablet (40 mg total) by mouth daily.  90 tablet  1  . spironolactone (ALDACTONE) 25 MG tablet TAKE 1 TABLET EVERY DAY  1 tablet  0  . torsemide (DEMADEX) 20 MG tablet Take 1 tablet (20 mg total) by mouth daily.  1 tablet  0  . [DISCONTINUED] pravastatin (PRAVACHOL) 40 MG tablet Take 40 mg by mouth daily.      . [DISCONTINUED] sertraline (ZOLOFT) 50 MG tablet Take 1 tablet (50 mg total) by mouth daily. Take  50mg  daily for 1 week then increase to 100mg  daily  60 tablet  2   Current Facility-Administered Medications  Medication Dose Route Frequency Provider Last Rate Last Dose  . Influenza (>/= 3 years) inactive virus vaccine (FLVIRIN/FLUZONE) injection SUSP 0.5 mL  0.5 mL Intramuscular Once Kerri Perches, MD         Past Surgical History  Procedure Laterality Date  . Testicle surgery    . Cardiac catheterization       Allergies  Allergen Reactions  . Ace Inhibitors     REACTION: throat swelling  . Buspirone     dizziness  Family History  Problem Relation Age of Onset  . Hypertension      Family History  . Stroke      Family History  . Diabetes      Family History  . Cancer Mother     brain tumor  . Hypertension Mother   . Diabetes Father   . Heart disease Maternal Grandmother      Social History Mr. Sartin reports that he has been smoking Cigarettes.  He has a 30 pack-year smoking history. He has never used smokeless tobacco. Mr. Blew reports that he drinks alcohol.   Review of Systems CONSTITUTIONAL: No weight loss, fever, chills, weakness or fatigue.  HEENT: Eyes: No visual loss, blurred vision, double vision or yellow sclerae.No hearing loss, sneezing, congestion, runny nose or sore throat.  SKIN: No rash or itching.  CARDIOVASCULAR: per HPI RESPIRATORY: No shortness of breath, cough or sputum.  GASTROINTESTINAL: No anorexia, nausea, vomiting or diarrhea. No abdominal pain or  blood.  GENITOURINARY: No burning on urination, no polyuria NEUROLOGICAL: No headache, dizziness, syncope, paralysis, ataxia, numbness or tingling in the extremities. No change in bowel or bladder control.  MUSCULOSKELETAL:back pain LYMPHATICS: No enlarged nodes. No history of splenectomy.  PSYCHIATRIC: No history of depression or anxiety.  ENDOCRINOLOGIC: No reports of sweating, cold or heat intolerance. No polyuria or polydipsia.  Marland Kitchen   Physical Examination p 71 bp 117/79 Wt 437 BMI 66 Gen: resting comfortably, no acute distress HEENT: no scleral icterus, pupils equal round and reactive, no palptable cervical adenopathy,  CV: RRR, no m/r/g, no JVD, no carotid bruits Resp: Clear to auscultation bilaterally GI: abdomen is soft, non-tender, non-distended, normal bowel sounds, no hepatosplenomegaly MSK: extremities are warm, no edema.  Skin: warm, no rash Neuro:  no focal deficits Psych: appropriate affect   Diagnostic Studies Echo 08/2012: LVEF 35-40%, moderate LVH, mild MR, mode LAE,   Event monitor 08/2013: PVCs, symptomatic 3 beat run of NSVT. Asymptomatic 8 beat run of NSVT. No symptomatic afib.   Assessment and Plan  1. NICM - last LVEF 35-40% 08/2012, up from previous studies - continuing to optimize medical therapy, has been limited by medication side effects and low blood pressures.  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 Tolerated valsartan but insurance no longer covers and not taking. Given potential risk for crossover reaction with angioedema on ARBs and the side effects he as already had, will not try to get him back on these meds - will increase Toprol XL, seems to tolerate bid dosing better than qday. Counseled to increased to 75mg  bid, if tolerates well without side effects after 2 weeks increase to 100mg  bid.  - will need repeat echo soon to reevaluate LVEF and establish any indications for ICD  2. Afib - remote unclear  history in the past, no repeat episodes documented. No symptomatic events noted on recent event monitor - continue to follow clinically. Does not have clear indication for anticoagulation at this time  3. Palpitations - symptoms correlated with PVCs, short runs of NSVT - continue to uptitrate beta blocker  4. OSA - continue home bipap   Antoine Poche, M.D., F.A.C.C.

## 2013-09-27 ENCOUNTER — Telehealth: Payer: Self-pay | Admitting: Cardiology

## 2013-09-27 NOTE — Telephone Encounter (Signed)
Message copied by Burnice Logan on Tue Sep 27, 2013 11:07 AM ------      Message from: [place] F      Created: Fri Sep 23, 2013  2:15 PM       There are 3 different patient's monitors scanned in under this result ------

## 2013-09-27 NOTE — Telephone Encounter (Signed)
Informed [name] of this error. She is going to contact necessary people to get this error fixed.

## 2013-10-24 ENCOUNTER — Telehealth: Payer: Self-pay | Admitting: Family Medicine

## 2013-10-24 ENCOUNTER — Ambulatory Visit (INDEPENDENT_AMBULATORY_CARE_PROVIDER_SITE_OTHER): Payer: Medicare Other | Admitting: Cardiology

## 2013-10-24 ENCOUNTER — Encounter: Payer: Self-pay | Admitting: Cardiology

## 2013-10-24 VITALS — BP 135/84 | HR 69 | Ht 68.0 in | Wt >= 6400 oz

## 2013-10-24 DIAGNOSIS — G4733 Obstructive sleep apnea (adult) (pediatric): Secondary | ICD-10-CM

## 2013-10-24 DIAGNOSIS — I428 Other cardiomyopathies: Secondary | ICD-10-CM | POA: Diagnosis not present

## 2013-10-24 NOTE — Patient Instructions (Signed)
Your physician recommends that you schedule a follow-up appointment in: 2 months with Dr. Wyline Mood. This appointment will be scheduled today before you leave.  Your physician recommends that you continue on your current medications as directed. Please refer to the Current Medication list given to you today.

## 2013-10-24 NOTE — Telephone Encounter (Signed)
[facility] has dropped him from the program because they changed apt and didn't get a letter about apt.  Call back number [phone]

## 2013-10-24 NOTE — Progress Notes (Signed)
Clinical Summary [name] is a 47 y.o.male seen today for follow up of the following medical problems.    1. NICM: thought to be EtOH related. Previous cath w/ patent coronaries  - Last echo 9/13 LVEF 35-40%, which was improved (previously 25%).  - his medication regimen has been limited due to side effects. He developed angioedema on ACE-I, and headaches on losartan.  - last visit tried to increase his Toprol XL, however he notes significant fatigue and sleepiness and thus has stayed on 50mg  bid, he is on bid dosing because he seems to tolerate it better. - he is working out at [employer], able to walk on the treadmill for 1/2 hour or so. Denies any orthopnea or PND, does have LE edema.   2. Afib  - seems to have been isolated episode, review of clinic notes report no recurrence, has not been on anticoag.  - no symptomatic episodes noted on recent event monitor  - no current symptoms  3. OSA: reports compliant w/ his bipap machine  - reports he feels his machine is old and worn out, concerned about function. Reports he has not followed up with sleep medicine in some time.   4. Depression - has had scheduling problems with his most recent psychiatrist, reports he was asked not to come back because he had missed too many appointments. - reports he is waiting for a follow up appointment with Dr Jeanice Lim to discuss a new referral.   Past Medical History  Diagnosis Date  . Chronic systolic heart failure   . Edema   . Atrial fibrillation   . Obesity, unspecified   . Anxiety state, unspecified   . Psychiatric disorder   . Obstructive sleep apnea   . History of medication noncompliance   . Alcohol abuse   . Shortness of breath   . Migraine      Allergies  Allergen Reactions  . Ace Inhibitors     REACTION: throat swelling  . Buspirone     dizziness     Current Outpatient Prescriptions  Medication Sig Dispense Refill  . aspirin 81 MG tablet Take 81 mg by mouth daily.          . clonazePAM (KLONOPIN) 1 MG tablet Take 1 tablet (1 mg total) by mouth 3 (three) times daily as needed for anxiety.  90 tablet  2  . HYDROcodone-acetaminophen (NORCO) 5-325 MG per tablet Take 1 tablet by mouth every 6 (six) hours as needed for pain.  30 tablet  0  . metoprolol succinate (TOPROL-XL) 50 MG 24 hr tablet Take 1 1/2 tablets by mouth twice daily  90 tablet  3  . nicotine (NICODERM CQ - DOSED IN MG/24 HOURS) 14 mg/24hr patch Place 1 patch onto the skin daily. For 2 weeks.  14 patch  0  . nicotine (NICODERM CQ - DOSED IN MG/24 HOURS) 21 mg/24hr patch Place 1 patch onto the skin daily. For 6 weeks.  42 patch  0  . nicotine (NICODERM CQ - DOSED IN MG/24 HR) 7 mg/24hr patch Place 1 patch onto the skin daily. For 2 weeks.  14 patch  0  . NON FORMULARY Bipap, pressure 16/12      . pantoprazole (PROTONIX) 40 MG tablet Take 1 tablet (40 mg total) by mouth daily.  90 tablet  1  . spironolactone (ALDACTONE) 25 MG tablet TAKE 1 TABLET EVERY DAY  1 tablet  0  . torsemide (DEMADEX) 20 MG tablet Take  1 tablet (20 mg total) by mouth daily.  1 tablet  0  . [DISCONTINUED] pravastatin (PRAVACHOL) 40 MG tablet Take 40 mg by mouth daily.      . [DISCONTINUED] sertraline (ZOLOFT) 50 MG tablet Take 1 tablet (50 mg total) by mouth daily. Take  50mg  daily for 1 week then increase to 100mg  daily  60 tablet  2   Current Facility-Administered Medications  Medication Dose Route Frequency Provider Last Rate Last Dose  . Influenza (>/= 3 years) inactive virus vaccine (FLVIRIN/FLUZONE) injection SUSP 0.5 mL  0.5 mL Intramuscular Once Kerri Perches, MD         Past Surgical History  Procedure Laterality Date  . Testicle surgery    . Cardiac catheterization       Allergies  Allergen Reactions  . Ace Inhibitors     REACTION: throat swelling  . Buspirone     dizziness      Family History  Problem Relation Age of Onset  . Hypertension      Family History  . Stroke      Family History  .  Diabetes      Family History  . Cancer Mother     brain tumor  . Hypertension Mother   . Diabetes Father   . Heart disease Maternal Grandmother      Social History [name] reports that he has been smoking Cigarettes.  He has a 30 pack-year smoking history. He has never used smokeless tobacco. [name] reports that he drinks alcohol.   Review of Systems CONSTITUTIONAL: No weight loss, fever, chills, weakness or fatigue.  HEENT: Eyes: No visual loss, blurred vision, double vision or yellow sclerae.No hearing loss, sneezing, congestion, runny nose or sore throat.  SKIN: No rash or itching.  CARDIOVASCULAR: per HPI RESPIRATORY: per HPI  GASTROINTESTINAL: No anorexia, nausea, vomiting or diarrhea. No abdominal pain or blood.  GENITOURINARY: No burning on urination, no polyuria NEUROLOGICAL: No headache, dizziness, syncope, paralysis, ataxia, numbness or tingling in the extremities. No change in bowel or bladder control.  MUSCULOSKELETAL: No muscle, back pain, joint pain or stiffness.  LYMPHATICS: No enlarged nodes. No history of splenectomy.  PSYCHIATRIC: Depression. No SI or HI. ENDOCRINOLOGIC: No reports of sweating, cold or heat intolerance. No polyuria or polydipsia.  Marland Kitchen   Physical Examination p 69 bp 135/84 Wt 439 lbs BMI 67 Gen: resting comfortably, no acute distress HEENT: no scleral icterus, pupils equal round and reactive, no palptable cervical adenopathy,  CV: RRR, no m/r/g, no JVD, no carotid bruits Resp: Clear to auscultation bilaterally GI: abdomen is soft, non-tender, non-distended, normal bowel sounds, no hepatosplenomegaly MSK: extremities are warm, 1+ bilateral edema Skin: warm, no rash Neuro:  no focal deficits Psych: appropriate affect   Diagnostic Studies Echo 08/2012: LVEF 35-40%, moderate LVH, mild MR, mode LAE,   Event monitor 08/2013: PVCs, symptomatic 3 beat run of NSVT. Asymptomatic 8 beat run of NSVT. No symptomatic afib.      Assessment and  Plan  1. NICM  - last LVEF 35-40% 08/2012, up from previous studies  - continuing to optimize medical therapy, has been limited by medication side effects and low blood pressures.  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 Tolerated valsartan but insurance no longer covers and not taking. Given potential risk for crossover reaction with angioedema on ARBs and the side effects he as already had, will not try to get him back on these meds.  The recent fatigue and somnolence he is having I am not sure is related to the beta blocker, he reports insomnia going to bed at 3AM, reports problems with his bipap machine, and also has depression which is currently not controlled. Continue beta blocker at current dose, consider attempting titrating again once other issues better controlled.  - will need repeat echo soon to reevaluate LVEF and establish any indications for ICD   2. Afib  - remote unclear history in the past, no repeat episodes documented. No symptomatic events noted on recent event monitor  - continue to follow clinically. Does not have clear indication for anticoagulation at this time   3. OSA  - continue home bipap, will review clinic notes and see if we can get him back into clinic for this.  4. Depression - denies any SI or HI, he reports he has a follow up with his PCP and at that time will discuss a new referral to psych.       Antoine Poche, M.D., F.A.C.C.

## 2013-10-26 NOTE — Telephone Encounter (Signed)
LMTRC

## 2013-11-09 ENCOUNTER — Ambulatory Visit (INDEPENDENT_AMBULATORY_CARE_PROVIDER_SITE_OTHER): Payer: Medicare Other | Admitting: Family Medicine

## 2013-11-09 ENCOUNTER — Encounter: Payer: Self-pay | Admitting: Family Medicine

## 2013-11-09 VITALS — BP 128/78 | HR 80 | Temp 97.3°F | Resp 18

## 2013-11-09 DIAGNOSIS — F411 Generalized anxiety disorder: Secondary | ICD-10-CM

## 2013-11-09 DIAGNOSIS — I10 Essential (primary) hypertension: Secondary | ICD-10-CM | POA: Diagnosis not present

## 2013-11-09 DIAGNOSIS — F329 Major depressive disorder, single episode, unspecified: Secondary | ICD-10-CM

## 2013-11-09 DIAGNOSIS — E119 Type 2 diabetes mellitus without complications: Secondary | ICD-10-CM | POA: Diagnosis not present

## 2013-11-09 DIAGNOSIS — G4733 Obstructive sleep apnea (adult) (pediatric): Secondary | ICD-10-CM

## 2013-11-09 DIAGNOSIS — F39 Unspecified mood [affective] disorder: Secondary | ICD-10-CM

## 2013-11-09 LAB — BASIC METABOLIC PANEL
CO2: 28 mEq/L (ref 19–32)
Calcium: 9.3 mg/dL (ref 8.4–10.5)
Creat: 1.04 mg/dL (ref 0.50–1.35)
Glucose, Bld: 123 mg/dL — ABNORMAL HIGH (ref 70–99)
Sodium: 140 mEq/L (ref 135–145)

## 2013-11-09 LAB — CBC
Hemoglobin: 15.5 g/dL (ref 13.0–17.0)
MCH: 29.2 pg (ref 26.0–34.0)
MCV: 82.1 fL (ref 78.0–100.0)
Platelets: 283 10*3/uL (ref 150–400)
RBC: 5.31 MIL/uL (ref 4.22–5.81)
RDW: 14.6 % (ref 11.5–15.5)
WBC: 8.7 10*3/uL (ref 4.0–10.5)

## 2013-11-09 LAB — HEMOGLOBIN A1C
Hgb A1c MFr Bld: 6.6 % — ABNORMAL HIGH (ref <5.7)
Mean Plasma Glucose: 143 mg/dL — ABNORMAL HIGH (ref <117)

## 2013-11-09 LAB — LIPID PANEL
Cholesterol: 173 mg/dL (ref 0–200)
HDL: 34 mg/dL — ABNORMAL LOW (ref >39)

## 2013-11-09 MED ORDER — PANTOPRAZOLE SODIUM 40 MG PO TBEC: 40.0000 mg | DELAYED_RELEASE_TABLET | Freq: Every day | ORAL | Status: DC

## 2013-11-09 MED ORDER — CLONAZEPAM 1 MG PO TABS: 1.0000 mg | ORAL_TABLET | Freq: Three times a day (TID) | ORAL | Status: DC | PRN

## 2013-11-09 MED ORDER — ESCITALOPRAM OXALATE 10 MG PO TABS: 10.0000 mg | ORAL_TABLET | Freq: Every day | ORAL | Status: DC

## 2013-11-09 NOTE — Patient Instructions (Addendum)
Referral to psychiatry Referral back to pulmonary  Labs done  Start lexapro  F/U 3 months

## 2013-11-10 ENCOUNTER — Encounter: Payer: Self-pay | Admitting: Family Medicine

## 2013-11-10 MED ORDER — METFORMIN HCL ER 500 MG PO TB24: 500.0000 mg | ORAL_TABLET | Freq: Every day | ORAL | Status: DC

## 2013-11-10 NOTE — Assessment & Plan Note (Signed)
This is an ongoing problem for patient. He is often noncompliant with medications. He declines returning to day [facility] mental health will try to see if there is another option with his Medicare. He agrees to start Lexapro 10 mg we'll continue Klonopin

## 2013-11-10 NOTE — Progress Notes (Signed)
   Subjective:    Patient ID: [name], male    DOB: 1966/07/14, 47 y.o.   MRN: [id number]  HPI Patient here to followup chronic medical problems. He has history of worsening depression and anxiety. He was being followed by [facility] but states that he was dismissed from the practice because he missed a couple of appointments. The only thing he is taking currently his Klonopin which is not controlling his symptoms.  Obstructive sleep apnea-he continues to have difficulty with his machine and take that the settings are incorrect. He has not followed up with his pulmonologist regarding his sleep apnea in greater than one year. He was recently evaluated by cardiology and there was some medication changes. He is taking his medications on a regular basis.  Morbid obesity-we are unable to weigh him in the office but he states he has been trying to go to the gym other he's been doing more weeks and has been very sore. He also did a 3 mile walk and is been having pain in his legs over the past 2 days.   Review of Systems   GEN- denies fatigue, fever, weight loss,weakness, recent illness HEENT- denies eye drainage, change in vision, nasal discharge, CVS- denies chest pain, palpitations RESP- denies SOB, cough, wheeze ABD- denies N/V, change in stools, abd pain GU- denies dysuria, hematuria, dribbling, incontinence MSK- denies joint pain, +muscle aches, injury Neuro- denies headache, dizziness, syncope, seizure activity      Objective:   Physical Exam GEN- NAD, alert and oriented x3, morbidly obese HEENT- PERRL, EOMI, non injected sclera, pink conjunctiva, MMM, oropharynx clear, CVS- RRR, no murmur RESP-CTAB EXT-1+ pitting  Edema,  Pulses- Radial, DP- 2+ Psych- Depressed affect, not overly anxious, no SI, well groomed, gets very upset when discussing the psychiatrist         Assessment & Plan:

## 2013-11-10 NOTE — Assessment & Plan Note (Addendum)
Recheck A1c he is currently diet controlled. He is on aspirin he is a allergic to ACE inhibitors He also needs lipid panel checked. Getting him to take medications on a regular basis is very difficult dECLINES IMMUNIZATIONS

## 2013-11-10 NOTE — Assessment & Plan Note (Signed)
Referral back to pulmonology to have his CPAP titrated

## 2013-11-10 NOTE — Assessment & Plan Note (Signed)
Per above we'll start Lexapro we'll try to find a different psychiatrist

## 2013-11-10 NOTE — Assessment & Plan Note (Signed)
Blood pressure is well controlled on current medication.

## 2013-11-11 ENCOUNTER — Telehealth: Payer: Self-pay | Admitting: Family Medicine

## 2013-11-11 NOTE — Telephone Encounter (Signed)
Pt wants to know about his A1C and his HIV test Call back number is [phone]

## 2013-11-15 DIAGNOSIS — F3110 Bipolar disorder, current episode manic without psychotic features, unspecified: Secondary | ICD-10-CM | POA: Diagnosis not present

## 2013-11-15 DIAGNOSIS — F319 Bipolar disorder, unspecified: Secondary | ICD-10-CM | POA: Diagnosis not present

## 2013-11-16 NOTE — Telephone Encounter (Signed)
Pt is aware of results. 

## 2013-11-24 DIAGNOSIS — J329 Chronic sinusitis, unspecified: Secondary | ICD-10-CM | POA: Diagnosis not present

## 2013-11-24 DIAGNOSIS — R059 Cough, unspecified: Secondary | ICD-10-CM | POA: Diagnosis not present

## 2013-11-24 DIAGNOSIS — G4730 Sleep apnea, unspecified: Secondary | ICD-10-CM | POA: Diagnosis not present

## 2013-11-24 DIAGNOSIS — R05 Cough: Secondary | ICD-10-CM | POA: Diagnosis not present

## 2013-11-24 DIAGNOSIS — F17200 Nicotine dependence, unspecified, uncomplicated: Secondary | ICD-10-CM | POA: Diagnosis not present

## 2013-11-24 DIAGNOSIS — R509 Fever, unspecified: Secondary | ICD-10-CM | POA: Diagnosis not present

## 2013-11-24 DIAGNOSIS — Z79899 Other long term (current) drug therapy: Secondary | ICD-10-CM | POA: Diagnosis not present

## 2013-11-24 DIAGNOSIS — Z7982 Long term (current) use of aspirin: Secondary | ICD-10-CM | POA: Diagnosis not present

## 2013-11-24 DIAGNOSIS — R42 Dizziness and giddiness: Secondary | ICD-10-CM | POA: Diagnosis not present

## 2013-11-24 DIAGNOSIS — R0789 Other chest pain: Secondary | ICD-10-CM | POA: Diagnosis not present

## 2013-11-24 DIAGNOSIS — I10 Essential (primary) hypertension: Secondary | ICD-10-CM | POA: Diagnosis not present

## 2013-12-05 ENCOUNTER — Ambulatory Visit (INDEPENDENT_AMBULATORY_CARE_PROVIDER_SITE_OTHER): Payer: Medicare Other | Admitting: Pulmonary Disease

## 2013-12-05 ENCOUNTER — Other Ambulatory Visit: Payer: Self-pay | Admitting: Pulmonary Disease

## 2013-12-05 ENCOUNTER — Encounter: Payer: Self-pay | Admitting: Pulmonary Disease

## 2013-12-05 VITALS — BP 132/88 | HR 68 | Temp 97.5°F | Ht 68.5 in | Wt >= 6400 oz

## 2013-12-05 DIAGNOSIS — G4733 Obstructive sleep apnea (adult) (pediatric): Secondary | ICD-10-CM

## 2013-12-05 NOTE — Patient Instructions (Signed)
Will get you a new bipap machine and supplies. Will get you referred to a new home care company Work on weight loss followup with me in one year if doing well.

## 2013-12-05 NOTE — Progress Notes (Signed)
   Subjective:    Patient ID: [name], male    DOB: Dec 29, 1965, 48 y.o.   MRN: [id number]  HPI The patient comes in today for followup of his known obstructive sleep apnea. He is been wearing BiPAP compliantly, but his machine is not working properly, and his mask is leaking. His current home care company no longer takes his insurance, and therefore he has had to pay for his mask and supplies out of pocket. He cannot afford this, and therefore has been using his same mask for years which he has taped. He is having poor sleep and daytime sleepiness because of his BiPAP issues.   Review of Systems  Constitutional: Positive for fever and fatigue. Negative for unexpected weight change.  HENT: Positive for congestion, postnasal drip, rhinorrhea and sore throat. Negative for dental problem, ear pain, nosebleeds, sinus pressure, sneezing and trouble swallowing.   Eyes: Negative for redness and itching.  Respiratory: Positive for cough. Negative for chest tightness, shortness of breath and wheezing.   Cardiovascular: Negative for palpitations and leg swelling.  Gastrointestinal: Negative for nausea and vomiting.  Genitourinary: Negative for dysuria.  Musculoskeletal: Negative for joint swelling.  Skin: Negative for rash.  Neurological: Negative for headaches.  Hematological: Does not bruise/bleed easily.  Psychiatric/Behavioral: Negative for dysphoric mood. The patient is not nervous/anxious.        Objective:   Physical Exam Morbidly obese male in no acute distress Nose without purulence or discharge noted No skin breakdown or pressure necrosis from the CPAP mask Neck without lymphadenopathy or thyromegaly Lower extremities with edema noted, no cyanosis Alert and oriented, moves all 4 extremities.       Assessment & Plan:

## 2013-12-05 NOTE — Assessment & Plan Note (Signed)
The patient has been wearing his BiPAP device compliantly, however the machine is not functioning properly, and he is using a mask that is taped up and continues to leak. His current home care company does not take Medicare or Medicaid, and he wishes to be referred to a new durable medical equipment company. I have also encouraged him to work aggressively on weight loss.

## 2013-12-13 DIAGNOSIS — F319 Bipolar disorder, unspecified: Secondary | ICD-10-CM | POA: Diagnosis not present

## 2013-12-13 DIAGNOSIS — F3110 Bipolar disorder, current episode manic without psychotic features, unspecified: Secondary | ICD-10-CM | POA: Diagnosis not present

## 2013-12-20 ENCOUNTER — Telehealth: Payer: Self-pay | Admitting: Pulmonary Disease

## 2013-12-20 DIAGNOSIS — G4733 Obstructive sleep apnea (adult) (pediatric): Secondary | ICD-10-CM

## 2013-12-20 NOTE — Telephone Encounter (Signed)
Did he get a new machine and new mask?? If so, let him know we set his device from the auto study done last year.  If he feels this is uncomfortable, can put his bipap machine on auto.  Set on pressure 5-20cm with pressure support of 4.  He is to call us in 2-3 weeks with response to the pressure change.

## 2013-12-20 NOTE — Telephone Encounter (Signed)
Called and spoke with pt. Pt received his machine last Monday. He reports he is waking up all through the night. He is feeling tired and sleepy during the day. He does not feel like the pressure may be high enough. He reports he is wearing this every night and wearing it all night. Please advise [place] thanks

## 2013-12-20 NOTE — Telephone Encounter (Signed)
Spoke with pt. He did get a new machine and mask. He would like his machine changed to auto. Order placed. Pt will call with update

## 2013-12-21 ENCOUNTER — Ambulatory Visit (INDEPENDENT_AMBULATORY_CARE_PROVIDER_SITE_OTHER): Payer: Medicaid Other | Admitting: Cardiology

## 2013-12-21 ENCOUNTER — Encounter: Payer: Self-pay | Admitting: Cardiology

## 2013-12-21 VITALS — BP 132/84 | HR 71 | Ht 68.0 in | Wt >= 6400 oz

## 2013-12-21 DIAGNOSIS — I428 Other cardiomyopathies: Secondary | ICD-10-CM

## 2013-12-21 DIAGNOSIS — I4891 Unspecified atrial fibrillation: Secondary | ICD-10-CM

## 2013-12-21 MED ORDER — HYDRALAZINE HCL 25 MG PO TABS: 25.0000 mg | ORAL_TABLET | Freq: Three times a day (TID) | ORAL | Status: DC

## 2013-12-21 MED ORDER — ISOSORBIDE MONONITRATE ER 30 MG PO TB24: 30.0000 mg | ORAL_TABLET | Freq: Every day | ORAL | Status: DC

## 2013-12-21 NOTE — Progress Notes (Signed)
Clinical Summary [name] is a 48 y.o.male seen today for follow up of the following medical problems.   1. NICM: thought to be EtOH related. Previous cath w/ patent coronaries  - Last echo 9/13 LVEF 35-40%, which was improved (previously 25%).  - his medication regimen has been limited due to side effects. He developed angioedema on ACE-I, and headaches on losartan.  - previously tried to increase his Toprol XL, however he notes significant fatigue and sleepiness and thus has stayed on 50mg  bid, he is on bid dosing because he seems to tolerate it better.  - he is working out at [employer], able to walk on the treadmill for 1/2 hour or so. Denies any orthopnea or PND, does have LE edema.   2. Afib  - seems to have been isolated episode, review of clinic notes report no recurrence, has not been on anticoag.  - no symptomatic episodes noted on recent event monitor  - no current symptoms   3. OSA - new machine recently, working on settings with Dr Gwenette Greet. - still has significant day time somnolence.   4. Depression  - started on lexapro by PCP, established with new therapist just recently    Past Medical History  Diagnosis Date  . Chronic systolic heart failure   . Edema   . Atrial fibrillation   . Obesity, unspecified   . Anxiety state, unspecified   . Psychiatric disorder   . Obstructive sleep apnea   . History of medication noncompliance   . Alcohol abuse   . Shortness of breath   . Migraine      Allergies  Allergen Reactions  . Ace Inhibitors     REACTION: throat swelling  . Buspirone     dizziness     Current Outpatient Prescriptions  Medication Sig Dispense Refill  . aspirin 81 MG tablet Take 81 mg by mouth daily.        . clonazePAM (KLONOPIN) 1 MG tablet Take 1 tablet (1 mg total) by mouth 3 (three) times daily as needed for anxiety.  90 tablet  2  . escitalopram (LEXAPRO) 10 MG tablet Take 1 tablet (10 mg total) by mouth daily.  30 tablet  1  .  HYDROcodone-acetaminophen (NORCO) 5-325 MG per tablet Take 1 tablet by mouth every 6 (six) hours as needed for pain.  30 tablet  0  . metFORMIN (GLUCOPHAGE XR) 500 MG 24 hr tablet Take 1 tablet (500 mg total) by mouth daily with breakfast.  30 tablet  6  . metoprolol succinate (TOPROL-XL) 50 MG 24 hr tablet Take 50 mg by mouth 2 (two) times daily.      . NON FORMULARY Bipap, pressure 16/12      . pantoprazole (PROTONIX) 40 MG tablet Take 1 tablet (40 mg total) by mouth daily.  90 tablet  1  . spironolactone (ALDACTONE) 25 MG tablet TAKE 1 TABLET EVERY DAY  1 tablet  0  . torsemide (DEMADEX) 20 MG tablet Take 20 mg by mouth daily. States is only taking as needed.      . [DISCONTINUED] pravastatin (PRAVACHOL) 40 MG tablet Take 40 mg by mouth daily.      . [DISCONTINUED] sertraline (ZOLOFT) 50 MG tablet Take 1 tablet (50 mg total) by mouth daily. Take  50mg  daily for 1 week then increase to 100mg  daily  60 tablet  2   No current facility-administered medications for this visit.     Past Surgical History  Procedure Laterality Date  . Testicle surgery    . Cardiac catheterization       Allergies  Allergen Reactions  . Ace Inhibitors     REACTION: throat swelling  . Buspirone     dizziness      Family History  Problem Relation Age of Onset  . Hypertension      Family History  . Stroke      Family History  . Diabetes      Family History  . Cancer Mother     brain tumor  . Hypertension Mother   . Diabetes Father   . Heart disease Maternal Grandmother      Social History [name] reports that he has been smoking Cigarettes.  He has a 30 pack-year smoking history. He has never used smokeless tobacco. [name] reports that he drinks alcohol.   Review of Systems CONSTITUTIONAL: No weight loss, fever, chills, weakness or fatigue.  HEENT: Eyes: No visual loss, blurred vision, double vision or yellow sclerae.No hearing loss, sneezing, congestion, runny nose or sore throat.    SKIN: No rash or itching.  CARDIOVASCULAR: per HPI RESPIRATORY: No shortness of breath, cough or sputum.  GASTROINTESTINAL: No anorexia, nausea, vomiting or diarrhea. No abdominal pain or blood.  GENITOURINARY: No burning on urination, no polyuria NEUROLOGICAL: No headache, dizziness, syncope, paralysis, ataxia, numbness or tingling in the extremities. No change in bowel or bladder control.  MUSCULOSKELETAL: No muscle, back pain, joint pain or stiffness.  LYMPHATICS: No enlarged nodes. No history of splenectomy.  PSYCHIATRIC: Depression ENDOCRINOLOGIC: No reports of sweating, cold or heat intolerance. No polyuria or polydipsia.  Marland Kitchen   Physical Examination p 71 bp 132/84 Wt 423 lbs BMI 64 Gen: resting comfortably, no acute distress HEENT: no scleral icterus, pupils equal round and reactive, no palptable cervical adenopathy,  CV: RRR, no m/r/g, no JVD, no carotid bruit Resp: Clear to auscultation bilaterally GI: abdomen is soft, non-tender, non-distended, normal bowel sounds, no hepatosplenomegaly MSK: extremities are warm, no edema.  Skin: warm, no rash Neuro:  no focal deficits Psych: appropriate affect   Diagnostic Studies Echo 08/2012: LVEF 35-40%, moderate LVH, mild MR, mode LAE,   Event monitor 08/2013: PVCs, symptomatic 3 beat run of NSVT. Asymptomatic 8 beat run of NSVT. No symptomatic afib.      Assessment and Plan  1. NICM  - last LVEF 35-40% 08/2012, up from previous studies  - continuing to optimize medical therapy, has been limited by medication side effects and low blood pressures as described above - will try starting low dose hydral/imdur - will need repeat echo soon to reevaluate LVEF and establish any indications for ICD    2. Afib  - remote unclear history in the past, no repeat episodes documented. No symptomatic events noted on recent event monitor  - continue to follow clinically. Does not have clear indication for anticoagulation at this time   3.  OSA  - continue home bipap, he has a new machine now that seems to work better  4. Depression  - recently established with new therapist, initiated on lexapro by pcp   Follow up 1 month    Arnoldo Lenis, M.D., F.A.C.C.

## 2013-12-21 NOTE — Patient Instructions (Signed)
   Begin Hydralazine 25mg  three times per day - new sent to pharm  Begin Imdur 30mg  daily - new sent to pharm Continue all other medications.   Follow up in  1 month

## 2013-12-27 ENCOUNTER — Telehealth: Payer: Self-pay | Admitting: Pulmonary Disease

## 2013-12-27 DIAGNOSIS — G4733 Obstructive sleep apnea (adult) (pediatric): Secondary | ICD-10-CM

## 2013-12-27 NOTE — Telephone Encounter (Signed)
I just spoke with him earlier in the month when he was on 14/11, and he felt he was not getting enough pressure and was still having apnea (see prior phone note).  Is the reason he is not tolerating the bipap on auto because of coughing and chest congestion such as a cold coming on??  We don't want to change bipap if he is getting a chest cold.  He may simply have to not use for a few nights until it improves??.  Find out what is going on with pt rather than going thru dme 3rd party.  Thanks.

## 2013-12-27 NOTE — Telephone Encounter (Signed)
Spoke with Yulee at [facility]. States that the pt called in the morning and let them know that he could not use his BiPap last night due to coughing and chest discomfort. Pt is currently on auto 5/20. He is original setting was 14/11 and was changed to 5/20 with support of 4. According to Corene Cornea he could not tolerated this setting and was changed to auto 5/20. [facility] is wondering if pt needs his pressure changed.  [place] - please advise. Thanks.

## 2013-12-28 NOTE — Telephone Encounter (Signed)
ATC call home # NA, no voicemail. LMTCBx1 on cell number. Sheridan Bing, CMA

## 2013-12-29 NOTE — Telephone Encounter (Signed)
LMTCBx2 on cell number. Called home number it rings twice, sounds like someone picks up, but then call is disconnected. Ferron Bing, CMA

## 2013-12-30 NOTE — Telephone Encounter (Signed)
Spoke with the pt and notified of recs per [facility]  I sent order to [facility] to have [place] work with him on mask fit  Will forward to [facility] so he is aware

## 2013-12-30 NOTE — Telephone Encounter (Signed)
Pt called back. He reports he is not getting a chest cold. He reports it is a mix up in the message that was delivered. He reports when he was on setting 14/11 it wasn't doing anything for him, he couldn't sleep and feeling tired all day. He is on auto now and finally last Thursday night and last night he slept great, did not wake up tired, woke up at 7:30 AM feeling great. I have printed off download for [facility] to review.please advise thanks

## 2013-12-30 NOTE — Telephone Encounter (Signed)
ATC x 2- someone picks up the phone and hangs up  [facility]

## 2013-12-30 NOTE — Telephone Encounter (Signed)
Here we go again.  The download shows that 15/11 is the optimal pressure.  Essentially the same as 14/11 that he says did nothing for him.  He did have a lot of mask leak as well.  I would recommend staying on auto vs going to 15/11.  He also needs to work on mask fit since he has excessive leak. I am ok with whatever he chooses.

## 2013-12-30 NOTE — Telephone Encounter (Signed)
ATC PT NA. He did mention he is having phone problems earlier. wcb

## 2013-12-30 NOTE — Telephone Encounter (Signed)
LMTCB

## 2013-12-30 NOTE — Telephone Encounter (Signed)
Pt is returning call & can be reached at [phone].  [name]

## 2013-12-30 NOTE — Telephone Encounter (Signed)
Called cell LMTCB

## 2013-12-30 NOTE — Telephone Encounter (Signed)
Returning call can be reached at [phone].Elnita Maxwell

## 2014-01-02 ENCOUNTER — Telehealth: Payer: Self-pay | Admitting: *Deleted

## 2014-01-02 MED ORDER — SPIRONOLACTONE 25 MG PO TABS: ORAL_TABLET | ORAL | Status: DC

## 2014-01-02 NOTE — Telephone Encounter (Signed)
Meds refilled.

## 2014-01-24 ENCOUNTER — Encounter: Payer: Medicare Other | Admitting: Cardiology

## 2014-01-24 NOTE — Progress Notes (Signed)
ERROR

## 2014-01-27 ENCOUNTER — Ambulatory Visit: Payer: Medicare Other | Admitting: Cardiology

## 2014-01-30 ENCOUNTER — Encounter: Payer: Self-pay | Admitting: Cardiology

## 2014-02-07 ENCOUNTER — Ambulatory Visit (INDEPENDENT_AMBULATORY_CARE_PROVIDER_SITE_OTHER): Payer: Medicare Other | Admitting: Family Medicine

## 2014-02-07 ENCOUNTER — Encounter: Payer: Self-pay | Admitting: Family Medicine

## 2014-02-07 ENCOUNTER — Other Ambulatory Visit: Payer: Self-pay | Admitting: *Deleted

## 2014-02-07 VITALS — BP 138/90 | HR 90 | Resp 18 | Ht 68.5 in | Wt >= 6400 oz

## 2014-02-07 DIAGNOSIS — Z9119 Patient's noncompliance with other medical treatment and regimen: Secondary | ICD-10-CM

## 2014-02-07 DIAGNOSIS — E119 Type 2 diabetes mellitus without complications: Secondary | ICD-10-CM | POA: Diagnosis not present

## 2014-02-07 DIAGNOSIS — I10 Essential (primary) hypertension: Secondary | ICD-10-CM

## 2014-02-07 DIAGNOSIS — Z91148 Patient's other noncompliance with medication regimen for other reason: Secondary | ICD-10-CM

## 2014-02-07 DIAGNOSIS — Z9114 Patient's other noncompliance with medication regimen: Secondary | ICD-10-CM

## 2014-02-07 DIAGNOSIS — Z91199 Patient's noncompliance with other medical treatment and regimen due to unspecified reason: Secondary | ICD-10-CM

## 2014-02-07 DIAGNOSIS — F411 Generalized anxiety disorder: Secondary | ICD-10-CM

## 2014-02-07 DIAGNOSIS — F329 Major depressive disorder, single episode, unspecified: Secondary | ICD-10-CM

## 2014-02-07 DIAGNOSIS — E785 Hyperlipidemia, unspecified: Secondary | ICD-10-CM | POA: Diagnosis not present

## 2014-02-07 DIAGNOSIS — Z20828 Contact with and (suspected) exposure to other viral communicable diseases: Secondary | ICD-10-CM

## 2014-02-07 LAB — MICROALBUMIN / CREATININE URINE RATIO
CREATININE, URINE: 352.6 mg/dL
Microalb Creat Ratio: 11 mg/g (ref 0.0–30.0)
Microalb, Ur: 3.88 mg/dL — ABNORMAL HIGH (ref 0.00–1.89)

## 2014-02-07 LAB — CBC WITH DIFFERENTIAL/PLATELET
Basophils Absolute: 0 10*3/uL (ref 0.0–0.1)
Basophils Relative: 0 % (ref 0–1)
Eosinophils Absolute: 0.2 10*3/uL (ref 0.0–0.7)
Eosinophils Relative: 3 % (ref 0–5)
HCT: 43.4 % (ref 39.0–52.0)
HEMOGLOBIN: 15.2 g/dL (ref 13.0–17.0)
LYMPHS ABS: 3.6 10*3/uL (ref 0.7–4.0)
LYMPHS PCT: 45 % (ref 12–46)
MCH: 28.8 pg (ref 26.0–34.0)
MCHC: 35 g/dL (ref 30.0–36.0)
MCV: 82.4 fL (ref 78.0–100.0)
Monocytes Absolute: 0.7 10*3/uL (ref 0.1–1.0)
Monocytes Relative: 9 % (ref 3–12)
NEUTROS PCT: 43 % (ref 43–77)
Neutro Abs: 3.4 10*3/uL (ref 1.7–7.7)
Platelets: 281 10*3/uL (ref 150–400)
RBC: 5.27 MIL/uL (ref 4.22–5.81)
RDW: 15.2 % (ref 11.5–15.5)
WBC: 7.9 10*3/uL (ref 4.0–10.5)

## 2014-02-07 LAB — HEMOGLOBIN A1C
Hgb A1c MFr Bld: 5.7 % — ABNORMAL HIGH (ref <5.7)
Mean Plasma Glucose: 117 mg/dL — ABNORMAL HIGH (ref <117)

## 2014-02-07 LAB — COMPREHENSIVE METABOLIC PANEL
ALBUMIN: 3.8 g/dL (ref 3.5–5.2)
ALK PHOS: 69 U/L (ref 39–117)
ALT: 16 U/L (ref 0–53)
AST: 11 U/L (ref 0–37)
BUN: 14 mg/dL (ref 6–23)
CO2: 27 meq/L (ref 19–32)
Calcium: 8.7 mg/dL (ref 8.4–10.5)
Chloride: 105 mEq/L (ref 96–112)
Creat: 1.05 mg/dL (ref 0.50–1.35)
GLUCOSE: 99 mg/dL (ref 70–99)
Potassium: 4 mEq/L (ref 3.5–5.3)
SODIUM: 140 meq/L (ref 135–145)
TOTAL PROTEIN: 6.9 g/dL (ref 6.0–8.3)
Total Bilirubin: 0.6 mg/dL (ref 0.2–1.2)

## 2014-02-07 LAB — LIPID PANEL
CHOL/HDL RATIO: 5.9 ratio
Cholesterol: 171 mg/dL (ref 0–200)
HDL: 29 mg/dL — AB (ref >39)
LDL Cholesterol: 107 mg/dL — ABNORMAL HIGH (ref 0–99)
TRIGLYCERIDES: 177 mg/dL — AB (ref <150)
VLDL: 35 mg/dL (ref 0–40)

## 2014-02-07 LAB — HIV ANTIBODY (ROUTINE TESTING W REFLEX): HIV: NONREACTIVE

## 2014-02-07 MED ORDER — SPIRONOLACTONE 25 MG PO TABS: ORAL_TABLET | ORAL | Status: DC

## 2014-02-07 MED ORDER — PANTOPRAZOLE SODIUM 40 MG PO TBEC: 40.0000 mg | DELAYED_RELEASE_TABLET | Freq: Every day | ORAL | Status: DC

## 2014-02-07 MED ORDER — ESCITALOPRAM OXALATE 10 MG PO TABS: 10.0000 mg | ORAL_TABLET | Freq: Every day | ORAL | Status: DC

## 2014-02-07 MED ORDER — ESCITALOPRAM OXALATE 20 MG PO TABS: 20.0000 mg | ORAL_TABLET | Freq: Every day | ORAL | Status: DC

## 2014-02-07 MED ORDER — METOPROLOL SUCCINATE ER 100 MG PO TB24: 100.0000 mg | ORAL_TABLET | Freq: Every day | ORAL | Status: DC

## 2014-02-07 NOTE — Assessment & Plan Note (Signed)
Blood pressure is uncontrolled due to his noncompliance. He can stop the indoor as a cause his symptoms though he typically has difficulties with most medications that we start him on. I have changed his metoprolol to the extended release to help with compliance. I've advised him to take this for 2 weeks straight if he does not have any difficulties with the extended release he can start the hydralazine that was prescribed by cardiology

## 2014-02-07 NOTE — Assessment & Plan Note (Signed)
Continue current meds, needs to f/u with psychiatry as I have him advised him

## 2014-02-07 NOTE — Patient Instructions (Signed)
Continue lexapro Call the psychiatrist for your appointment Toprol 100mg  extended release We will call with lab results F/U 3 months

## 2014-02-07 NOTE — Assessment & Plan Note (Signed)
Check fasting lipid panel 

## 2014-02-07 NOTE — Progress Notes (Signed)
Patient ID: [name], male   DOB: 1965-12-30, 48 y.o.   MRN: [id number]   Subjective:    Patient ID: [name], male    DOB: May 02, 1966, 48 y.o.   MRN: [id number]  Patient presents for 3 month F/U  patient to follow chronic medical problems. He has a history of noncompliance with his medications which continues. He was seen by cardiology secondary to hypertension as well as cardiomyopathy and CHF. He was started on Imdur were 30 mg daily he took 2 doses of this and states that he had a severe headache both times and he felt very ill therefore he stopped taking it. He never started hydralazine 25 mg 3 times a day. He has been taking metoprolol 50 mg which is dosed as twice a day however he forgets to take the evening dose. He does take his water pill only when he swells.  Anxiety and mood disorder-he was referred to a new psychiatrist at the last visit has I'm concerned for underlying bipolar with his mood disorder. He was seen by the psychiatrist once in the therapist [facility] recommended that he come off of the Klonopin but no other medications were given and he missed his followup appointment. He has been taking the Lexapro once a day initially states that he thought it was making him gain weight but then thinks that it may be helping after all. He also continues to take his Klonopin as needed    Review Of Systems:  GEN- denies fatigue, fever, weight loss,weakness, recent illness HEENT- denies eye drainage, change in vision, nasal discharge, CVS- denies chest pain, palpitations RESP- denies SOB, cough, wheeze ABD- denies N/V, change in stools, abd pain GU- denies dysuria, hematuria, dribbling, incontinence MSK- denies joint pain, muscle aches, injury Neuro- denies headache, dizziness, syncope, seizure activity       Objective:    BP 138/90  Pulse 90  Resp 18  Ht 5' 8.5" (1.74 m)  Wt 420 lb (190.511 kg)  BMI 62.92 kg/m2 GEN- NAD, alert and oriented x3, morbidly obese HEENT-  PERRL, EOMI, non injected sclera, pink conjunctiva, MMM, oropharynx clear CVS- RRR, no murmur RESP-CTAB EXT- trace edema Pulses- Radial, DP- 2+ Psych- depressed affect, not anxious, no SI, no hallucinations   Request HIV test     Assessment & Plan:      Problem List Items Addressed This Visit   UNSPECIFIED ESSENTIAL HYPERTENSION - Primary     Blood pressure is uncontrolled due to his noncompliance. He can stop the indoor as a cause his symptoms though he typically has difficulties with most medications that we start him on. I have changed his metoprolol to the extended release to help with compliance. I've advised him to take this for 2 weeks straight if he does not have any difficulties with the extended release he can start the hydralazine that was prescribed by cardiology    Relevant Medications      metoprolol succinate (TOPROL-XL) 24 hr tablet      spironolactone (ALDACTONE) tablet   Other and unspecified hyperlipidemia     Check fasting lipid panel    Relevant Orders      Lipid panel (Completed)      Microalbumin / creatinine urine ratio (Completed)   Non compliance w medication regimen   Major depression (Chronic)     Continue lexapro    Diabetes mellitus     Currently diet controlled given him a prescription for new glucometer check fasting glucose Urine micro-done  He declines immunizations    Relevant Orders      Hemoglobin A1c (Completed)      Comprehensive metabolic panel (Completed)      CBC with Differential (Completed)      HM DIABETES FOOT EXAM (Completed)   ANXIETY STATE, UNSPECIFIED     Continue current meds, needs to f/u with psychiatry as I have him advised him     Other Visit Diagnoses   Contact with or exposure to viral disease        Relevant Orders       HIV antibody (Completed)       Note: This dictation was prepared with Dragon dictation along with smaller phrase technology. Any transcriptional errors that result from this process are  unintentional.

## 2014-02-07 NOTE — Assessment & Plan Note (Signed)
Continue lexapro  ?

## 2014-02-07 NOTE — Assessment & Plan Note (Signed)
Currently diet controlled given him a prescription for new glucometer check fasting glucose Urine micro-done He declines immunizations

## 2014-02-07 NOTE — Telephone Encounter (Signed)
Refill appropriate and filled per protocol. 

## 2014-04-11 ENCOUNTER — Telehealth: Payer: Self-pay | Admitting: Pulmonary Disease

## 2014-04-11 NOTE — Telephone Encounter (Signed)
I spoke with the pt and he states he has been waking up feeling tired in the mornings and feeling very fatigued during the day. He states he is falling asleep during the day at random times and doesn't realize the he is doing it. Looks like at last OV bipap was set on auto. I advised the pt that I will get a download and send message to [facility] to advise on his return tomorrow. Pt states understanding. I called and requested download from [facility]. Will await fax. Chester Bing, CMA

## 2014-04-11 NOTE — Telephone Encounter (Signed)
[email or web address].Jennifer Castillo, CMA  

## 2014-04-12 NOTE — Telephone Encounter (Signed)
Download received and placed in [place] green folder. Please advise on message below, thanks. Atlas Bing, CMA

## 2014-04-13 NOTE — Telephone Encounter (Signed)
Spoke with pt and advised of Download results.  Pt  Does report having a tear in the rubber pice on his mask.  I told him we would contact [place] regarding this and check for a leak.  Pt states that he still feels extremely fatigued and still feels like he is not sleeping well at all.  The only med change was changing Metoprolol 50 mg bid to 100mg  QD.  Pt does have some increased stress due to moving.  Please advise if anything else to try.

## 2014-04-13 NOTE — Telephone Encounter (Signed)
Let pt know that his download shows good compliance with his bipap, and his sleep apnea is WELL controlled.  The only other thing is that he appears to be having a lot of mask leak.  We can have his dme work with him on fit if he thinks this is a problem.   His new symptoms may have nothing to do with his sleep apnea.  Is he getting enough sleep, has there been more stress or anxiety recently that can interfere with sleep .  Has he had medication changes??  Would not do anything different with bipap except check mask fit.

## 2014-04-13 NOTE — Telephone Encounter (Signed)
I feel confident this is not related to his sleep apnea if it does not improve with changing masks.  Needs to keep working on maintaining a regular sleep schedule, no napping during day.  Beta blockers such as metoprolol can make him feel tired and sleepy at higher doses.  If his symptoms occurred about the same time as med increase, he should consider this.

## 2014-04-14 NOTE — Telephone Encounter (Signed)
lmomtcb x1 for pt 

## 2014-04-18 NOTE — Telephone Encounter (Signed)
LMTCB again

## 2014-04-18 NOTE — Telephone Encounter (Signed)
Pt retuening call.[name] ° °

## 2014-04-18 NOTE — Telephone Encounter (Signed)
I spoke with the [name] and notified of recs per [facility]  [name] verbalized understanding  Nothing further needed

## 2014-04-18 NOTE — Telephone Encounter (Signed)
LMTCB

## 2014-04-26 DIAGNOSIS — E669 Obesity, unspecified: Secondary | ICD-10-CM | POA: Diagnosis not present

## 2014-04-26 DIAGNOSIS — R079 Chest pain, unspecified: Secondary | ICD-10-CM | POA: Diagnosis not present

## 2014-04-26 DIAGNOSIS — F411 Generalized anxiety disorder: Secondary | ICD-10-CM | POA: Diagnosis present

## 2014-04-26 DIAGNOSIS — Z91199 Patient's noncompliance with other medical treatment and regimen due to unspecified reason: Secondary | ICD-10-CM | POA: Diagnosis not present

## 2014-04-26 DIAGNOSIS — R0602 Shortness of breath: Secondary | ICD-10-CM | POA: Diagnosis not present

## 2014-04-26 DIAGNOSIS — J449 Chronic obstructive pulmonary disease, unspecified: Secondary | ICD-10-CM | POA: Diagnosis present

## 2014-04-26 DIAGNOSIS — Z6841 Body Mass Index (BMI) 40.0 and over, adult: Secondary | ICD-10-CM | POA: Diagnosis not present

## 2014-04-26 DIAGNOSIS — Z888 Allergy status to other drugs, medicaments and biological substances status: Secondary | ICD-10-CM | POA: Diagnosis not present

## 2014-04-26 DIAGNOSIS — E119 Type 2 diabetes mellitus without complications: Secondary | ICD-10-CM | POA: Diagnosis present

## 2014-04-26 DIAGNOSIS — J189 Pneumonia, unspecified organism: Secondary | ICD-10-CM | POA: Diagnosis not present

## 2014-04-26 DIAGNOSIS — Z79899 Other long term (current) drug therapy: Secondary | ICD-10-CM | POA: Diagnosis not present

## 2014-04-26 DIAGNOSIS — F329 Major depressive disorder, single episode, unspecified: Secondary | ICD-10-CM | POA: Diagnosis present

## 2014-04-26 DIAGNOSIS — F17200 Nicotine dependence, unspecified, uncomplicated: Secondary | ICD-10-CM | POA: Diagnosis present

## 2014-04-26 DIAGNOSIS — M79609 Pain in unspecified limb: Secondary | ICD-10-CM | POA: Diagnosis not present

## 2014-04-26 DIAGNOSIS — Z7982 Long term (current) use of aspirin: Secondary | ICD-10-CM | POA: Diagnosis not present

## 2014-04-26 DIAGNOSIS — I509 Heart failure, unspecified: Secondary | ICD-10-CM | POA: Diagnosis present

## 2014-04-26 DIAGNOSIS — F3289 Other specified depressive episodes: Secondary | ICD-10-CM | POA: Diagnosis present

## 2014-04-26 DIAGNOSIS — I2699 Other pulmonary embolism without acute cor pulmonale: Secondary | ICD-10-CM | POA: Diagnosis not present

## 2014-04-26 DIAGNOSIS — F410 Panic disorder [episodic paroxysmal anxiety] without agoraphobia: Secondary | ICD-10-CM | POA: Diagnosis present

## 2014-04-26 DIAGNOSIS — G4733 Obstructive sleep apnea (adult) (pediatric): Secondary | ICD-10-CM | POA: Diagnosis present

## 2014-04-26 DIAGNOSIS — Z883 Allergy status to other anti-infective agents status: Secondary | ICD-10-CM | POA: Diagnosis not present

## 2014-04-26 DIAGNOSIS — I10 Essential (primary) hypertension: Secondary | ICD-10-CM | POA: Diagnosis present

## 2014-04-26 DIAGNOSIS — R0789 Other chest pain: Secondary | ICD-10-CM | POA: Diagnosis not present

## 2014-04-26 DIAGNOSIS — I426 Alcoholic cardiomyopathy: Secondary | ICD-10-CM | POA: Diagnosis present

## 2014-04-26 DIAGNOSIS — Z9119 Patient's noncompliance with other medical treatment and regimen: Secondary | ICD-10-CM | POA: Diagnosis not present

## 2014-04-26 DIAGNOSIS — I4891 Unspecified atrial fibrillation: Secondary | ICD-10-CM | POA: Diagnosis present

## 2014-04-26 DIAGNOSIS — R748 Abnormal levels of other serum enzymes: Secondary | ICD-10-CM | POA: Diagnosis not present

## 2014-04-26 DIAGNOSIS — I5022 Chronic systolic (congestive) heart failure: Secondary | ICD-10-CM | POA: Diagnosis not present

## 2014-04-26 DIAGNOSIS — J441 Chronic obstructive pulmonary disease with (acute) exacerbation: Secondary | ICD-10-CM | POA: Diagnosis not present

## 2014-04-27 ENCOUNTER — Telehealth: Payer: Self-pay | Admitting: *Deleted

## 2014-04-27 DIAGNOSIS — R0789 Other chest pain: Secondary | ICD-10-CM | POA: Diagnosis not present

## 2014-04-27 DIAGNOSIS — R079 Chest pain, unspecified: Secondary | ICD-10-CM | POA: Diagnosis not present

## 2014-04-27 DIAGNOSIS — I2699 Other pulmonary embolism without acute cor pulmonale: Secondary | ICD-10-CM | POA: Diagnosis not present

## 2014-04-27 DIAGNOSIS — I5022 Chronic systolic (congestive) heart failure: Secondary | ICD-10-CM | POA: Diagnosis not present

## 2014-04-27 DIAGNOSIS — M79609 Pain in unspecified limb: Secondary | ICD-10-CM | POA: Diagnosis not present

## 2014-04-27 DIAGNOSIS — R748 Abnormal levels of other serum enzymes: Secondary | ICD-10-CM | POA: Diagnosis not present

## 2014-04-27 NOTE — Telephone Encounter (Signed)
Message copied by Sheral Flow on Thu Apr 27, 2014  8:22 AM ------      Message from: Devoria Glassing      Created: Thu Apr 27, 2014  8:00 AM       Patient is in the hospital for blood clots and would like dr Buelah Manis to call him at [phone] ------

## 2014-04-27 NOTE — Telephone Encounter (Signed)
Call returned to patient.   Patient reports that he is currently admitted to [facility]. States that he went to ER last night with chest pain that radiated to his back, which he thought was pneumonia. CXR revealed that he has clots in lungs.  MD to be made aware.

## 2014-04-27 NOTE — Telephone Encounter (Signed)
Noted, he can make hospital f/u

## 2014-04-28 DIAGNOSIS — R079 Chest pain, unspecified: Secondary | ICD-10-CM | POA: Diagnosis not present

## 2014-05-01 ENCOUNTER — Ambulatory Visit (INDEPENDENT_AMBULATORY_CARE_PROVIDER_SITE_OTHER): Payer: Medicare Other | Admitting: Family Medicine

## 2014-05-01 ENCOUNTER — Encounter: Payer: Self-pay | Admitting: Family Medicine

## 2014-05-01 VITALS — BP 132/74 | HR 64 | Temp 97.6°F | Resp 18 | Ht 68.5 in | Wt >= 6400 oz

## 2014-05-01 DIAGNOSIS — I2699 Other pulmonary embolism without acute cor pulmonale: Secondary | ICD-10-CM | POA: Insufficient documentation

## 2014-05-01 DIAGNOSIS — I509 Heart failure, unspecified: Secondary | ICD-10-CM

## 2014-05-01 DIAGNOSIS — Z9114 Patient's other noncompliance with medication regimen: Secondary | ICD-10-CM

## 2014-05-01 DIAGNOSIS — Z9119 Patient's noncompliance with other medical treatment and regimen: Secondary | ICD-10-CM | POA: Diagnosis not present

## 2014-05-01 DIAGNOSIS — Z91148 Patient's other noncompliance with medication regimen for other reason: Secondary | ICD-10-CM

## 2014-05-01 DIAGNOSIS — I10 Essential (primary) hypertension: Secondary | ICD-10-CM

## 2014-05-01 DIAGNOSIS — Z91199 Patient's noncompliance with other medical treatment and regimen due to unspecified reason: Secondary | ICD-10-CM | POA: Diagnosis not present

## 2014-05-01 DIAGNOSIS — I5022 Chronic systolic (congestive) heart failure: Secondary | ICD-10-CM

## 2014-05-01 DIAGNOSIS — J189 Pneumonia, unspecified organism: Secondary | ICD-10-CM

## 2014-05-01 DIAGNOSIS — I4891 Unspecified atrial fibrillation: Secondary | ICD-10-CM

## 2014-05-01 DIAGNOSIS — I428 Other cardiomyopathies: Secondary | ICD-10-CM

## 2014-05-01 LAB — CBC WITH DIFFERENTIAL/PLATELET
Basophils Absolute: 0 10*3/uL (ref 0.0–0.1)
Basophils Relative: 0 % (ref 0–1)
EOS PCT: 4 % (ref 0–5)
Eosinophils Absolute: 0.3 10*3/uL (ref 0.0–0.7)
HCT: 41.5 % (ref 39.0–52.0)
HEMOGLOBIN: 14.9 g/dL (ref 13.0–17.0)
Lymphocytes Relative: 41 % (ref 12–46)
Lymphs Abs: 3.6 10*3/uL (ref 0.7–4.0)
MCH: 29.2 pg (ref 26.0–34.0)
MCHC: 35.9 g/dL (ref 30.0–36.0)
MCV: 81.4 fL (ref 78.0–100.0)
MONOS PCT: 8 % (ref 3–12)
Monocytes Absolute: 0.7 10*3/uL (ref 0.1–1.0)
NEUTROS ABS: 4.1 10*3/uL (ref 1.7–7.7)
Neutrophils Relative %: 47 % (ref 43–77)
Platelets: 290 10*3/uL (ref 150–400)
RBC: 5.1 MIL/uL (ref 4.22–5.81)
RDW: 14.7 % (ref 11.5–15.5)
WBC: 8.7 10*3/uL (ref 4.0–10.5)

## 2014-05-01 LAB — COMPREHENSIVE METABOLIC PANEL
ALBUMIN: 3.8 g/dL (ref 3.5–5.2)
ALT: 24 U/L (ref 0–53)
AST: 17 U/L (ref 0–37)
Alkaline Phosphatase: 78 U/L (ref 39–117)
BUN: 19 mg/dL (ref 6–23)
CALCIUM: 9.4 mg/dL (ref 8.4–10.5)
CHLORIDE: 106 meq/L (ref 96–112)
CO2: 25 mEq/L (ref 19–32)
CREATININE: 1.15 mg/dL (ref 0.50–1.35)
GLUCOSE: 76 mg/dL (ref 70–99)
POTASSIUM: 4.2 meq/L (ref 3.5–5.3)
Sodium: 139 mEq/L (ref 135–145)
Total Bilirubin: 0.5 mg/dL (ref 0.2–1.2)
Total Protein: 7.3 g/dL (ref 6.0–8.3)

## 2014-05-01 NOTE — Assessment & Plan Note (Signed)
Complete Levaquin obtain records

## 2014-05-01 NOTE — Patient Instructions (Signed)
Continue current medication Take the blood thinner F/U as previous

## 2014-05-01 NOTE — Assessment & Plan Note (Signed)
There is a question if he has an acute pulmonary embolism. I will obtain the records it seems like a mattress cover him for PE if this cannot be ruled out completely.

## 2014-05-01 NOTE — Assessment & Plan Note (Signed)
He continues to have noncompliant to some of his medication regimen. He will be referred to a new cardiologist secondary to him  moving to the [place] area. His electrolytes were rechecked today. Advised him to take his diuretic as he has gained some fluid

## 2014-05-01 NOTE — Progress Notes (Signed)
Patient ID: [name], male   DOB: 1965-12-06, 48 y.o.   MRN: [id number]   Subjective:    Patient ID: [name], male    DOB: 1966/05/21, 48 y.o.   MRN: [id number]  Patient presents for hospital F/U  patient here for hospital followup. He was admitted to [facility] secondary to some shortness of breath and left-sided chest pain. States that he was diagnosed with pneumonia but there was possibility of blood clot therefore he was started on blood thinners. I do not have any discharge summary. He was seen by cardiology during the visit and they wanted to change some of his medications however he declined but needs a new cardiologist in [place] as he is now moved from [facility]. He's not had any further chest pain and no changes shortness of breath. Of note he had not been taking his Xarelto as prescribed since he was discharged because he was fearful of the medication which is always been a problem with this and his compliance with medications. He is completing his antibiotics regarding the pneumonia.  He tells me that he did continue taking the hydralazine as it was initially given to him in the hospital he did not have any difficulties he had not started this medication prior to being admitted. His EF was 35% per report    Review Of Systems:  GEN- denies fatigue, fever, weight loss,weakness, recent illness HEENT- denies eye drainage, change in vision, nasal discharge, CVS- denies chest pain, palpitations RESP- +SOB, cough, wheeze ABD- denies N/V, change in stools, abd pain GU- denies dysuria, hematuria, dribbling, incontinence MSK- denies joint pain, muscle aches, injury Neuro- denies headache, dizziness, syncope, seizure activity       Objective:    BP 132/74  Pulse 64  Temp(Src) 97.6 F (36.4 C) (Oral)  Resp 18  Ht 5' 8.5" (1.74 m)  Wt 409 lb (185.521 kg)  BMI 61.28 kg/m2 GEN- NAD, alert and oriented x3, obese  HEENT- PERRL, EOMI, non injected  sclera, pink conjunctiva, MMM, oropharynx clear CVS- RRR, no murmur RESP-decreased at bases, mild crackles right LL Skin- ecchymosis at sites of lovenex on abdomen EXT- Pedal edema Pulses- Radial 2+        Assessment & Plan:      Problem List Items Addressed This Visit   UNSPECIFIED ESSENTIAL HYPERTENSION   Relevant Medications      Rivaroxaban (XARELTO) 15 MG TABS tablet   Other Relevant Orders      CBC with Differential (Completed)      Comprehensive metabolic panel (Completed)   CAP (community acquired pneumonia) - Primary   Relevant Medications      levofloxacin (LEVAQUIN) 750 MG tablet   Other Relevant Orders      CBC with Differential (Completed)      Comprehensive metabolic panel (Completed)      Note: This dictation was prepared with Dragon dictation along with smaller phrase technology. Any transcriptional errors that result from this process are unintentional.

## 2014-05-05 ENCOUNTER — Other Ambulatory Visit: Payer: Self-pay | Admitting: *Deleted

## 2014-05-05 MED ORDER — RIVAROXABAN 20 MG PO TABS: 20.0000 mg | ORAL_TABLET | Freq: Every day | ORAL | Status: DC

## 2014-05-10 ENCOUNTER — Ambulatory Visit (INDEPENDENT_AMBULATORY_CARE_PROVIDER_SITE_OTHER): Payer: Medicare Other | Admitting: Family Medicine

## 2014-05-10 ENCOUNTER — Encounter: Payer: Self-pay | Admitting: Family Medicine

## 2014-05-10 VITALS — BP 136/82 | HR 67 | Temp 98.3°F | Resp 18

## 2014-05-10 DIAGNOSIS — F411 Generalized anxiety disorder: Secondary | ICD-10-CM | POA: Diagnosis not present

## 2014-05-10 DIAGNOSIS — G43909 Migraine, unspecified, not intractable, without status migrainosus: Secondary | ICD-10-CM

## 2014-05-10 DIAGNOSIS — J189 Pneumonia, unspecified organism: Secondary | ICD-10-CM

## 2014-05-10 DIAGNOSIS — F329 Major depressive disorder, single episode, unspecified: Secondary | ICD-10-CM

## 2014-05-10 DIAGNOSIS — I2699 Other pulmonary embolism without acute cor pulmonale: Secondary | ICD-10-CM

## 2014-05-10 MED ORDER — HYDROCODONE-ACETAMINOPHEN 5-325 MG PO TABS: 1.0000 | ORAL_TABLET | Freq: Four times a day (QID) | ORAL | Status: DC | PRN

## 2014-05-10 NOTE — Assessment & Plan Note (Signed)
With elevated d-dimer as well as VQ scan we'll treat for pulmonary embolism for 6 months on blood thinners

## 2014-05-10 NOTE — Assessment & Plan Note (Signed)
He has pain meds, declined other meds in past

## 2014-05-10 NOTE — Patient Instructions (Signed)
Take the klonopin once a day  After 3 weeks , start xarelto 20mg  once a day  F/U 3 months

## 2014-05-10 NOTE — Assessment & Plan Note (Signed)
Impression is unchanged he's been a multiple psychiatrist does not followup he has been taking his Lexapro I have advised him to use his Klonopin a least once a day to help with anxiety

## 2014-05-10 NOTE — Progress Notes (Signed)
Patient ID: [name], male   DOB: Nov 14, 1966, 48 y.o.   MRN: [id number]   Subjective:    Patient ID: [name], male    DOB: 10-06-1966, 48 y.o.   MRN: [id number]  Patient presents for 1 week F/U  patient here for interim followup on his medications and recent hospitalization. I reviewed his hospital discharge he elevated d-dimer as well as high risk VQ scan therefore is currently on treatment for pulmonary embolism and I agree with this. He will start  Xarelto 20 mg after he completes a 15 mg twice a day for a total of 3 weeks.  Pneumonia he will complete his Levaquin today. He continues to have some cough but is much improved she's not had any fever. He does continue to get some soreness on the right side of his chest which is when the reason that brought him into the hospital. He hasn't taken his hydrocodone for that as well as a headache and request a refill on this medication.  Panic attacks he's had panic attacks in the past couple weeks. He's been under a lot of stress when the hospitalization to he moved into a new home and has been trying to maintain work. He has been taking his Lexapro however taking his Klonopin only as needed. When he took the Klonopin during his last attack this week and all his symptoms resolved quickly   No meds taken today  Review Of Systems:  GEN- + fatigue, fever, weight loss,weakness, recent illness HEENT- denies eye drainage, change in vision, nasal discharge, CVS- + chest pain, palpitations RESP- denies SOB, cough, wheeze ABD- denies N/V, change in stools, abd pain GU- denies dysuria, hematuria, dribbling, incontinence MSK- denies joint pain, muscle aches, injury Neuro-+ headache, dizziness, syncope, seizure activity       Objective:    BP 136/82  Pulse 67  Temp(Src) 98.3 F (36.8 C) (Oral)  Resp 18  SpO2 98% GEN- NAD, alert and oriented x3, obese  HEENT- PERRL, EOMI, non injected sclera, pink conjunctiva, MMM, oropharynx  clear CVS- RRR, no murmur Chest wall- TTP lower right chest wall, no bruising noted RESP-decreased at bases,CTAB Psych- a little anxious appearing, not overly depressed, well grommed EXT- Pedal edema Pulses- Radial 2+         Assessment & Plan:      Problem List Items Addressed This Visit   None      Note: This dictation was prepared with Dragon dictation along with smaller phrase technology. Any transcriptional errors that result from this process are unintentional.

## 2014-05-10 NOTE — Assessment & Plan Note (Addendum)
His symptoms are much improved. Will complete antibiotics. I did check his labs last week his white count is normal Pain medication refilled #20 tablets for chest wall pain

## 2014-05-17 ENCOUNTER — Telehealth: Payer: Self-pay | Admitting: *Deleted

## 2014-05-17 NOTE — Telephone Encounter (Signed)
Patient called office in regards to blood noted in urine.   States that he has had no injury to area, and it is not painful to urinate.   Reports that he awoke from nap on couch and noted that penis was discolored and was darker than usual. States moderate amount bright red blood noted in urine. Denied c/o pain or discomfort.   Advised to go to ER to Urgent Care.   MD to be made aware.

## 2014-05-17 NOTE — Telephone Encounter (Signed)
Message copied by Sheral Flow on Wed May 17, 2014  4:46 PM ------      Message from: Devoria Glassing      Created: Wed May 17, 2014  3:56 PM       Patient is calling to speak to you about blood in urine       [phone] ------

## 2014-05-17 NOTE — Telephone Encounter (Signed)
Noted   , to ER

## 2014-05-20 ENCOUNTER — Other Ambulatory Visit: Payer: Self-pay | Admitting: Family Medicine

## 2014-05-22 ENCOUNTER — Encounter: Payer: Medicare Other | Admitting: Cardiology

## 2014-05-22 ENCOUNTER — Encounter: Payer: Self-pay | Admitting: Cardiology

## 2014-05-22 NOTE — Telephone Encounter (Signed)
Refill appropriate and filled per protocol. 

## 2014-05-22 NOTE — Progress Notes (Signed)
ERROR

## 2014-05-23 ENCOUNTER — Emergency Department (HOSPITAL_COMMUNITY): Payer: Medicare Other

## 2014-05-23 ENCOUNTER — Encounter (HOSPITAL_COMMUNITY): Payer: Self-pay | Admitting: Emergency Medicine

## 2014-05-23 DIAGNOSIS — J069 Acute upper respiratory infection, unspecified: Secondary | ICD-10-CM | POA: Insufficient documentation

## 2014-05-23 DIAGNOSIS — E669 Obesity, unspecified: Secondary | ICD-10-CM | POA: Insufficient documentation

## 2014-05-23 DIAGNOSIS — F17200 Nicotine dependence, unspecified, uncomplicated: Secondary | ICD-10-CM | POA: Insufficient documentation

## 2014-05-23 DIAGNOSIS — Z792 Long term (current) use of antibiotics: Secondary | ICD-10-CM | POA: Diagnosis not present

## 2014-05-23 DIAGNOSIS — R369 Urethral discharge, unspecified: Secondary | ICD-10-CM | POA: Diagnosis not present

## 2014-05-23 DIAGNOSIS — R0602 Shortness of breath: Secondary | ICD-10-CM | POA: Diagnosis not present

## 2014-05-23 DIAGNOSIS — I5022 Chronic systolic (congestive) heart failure: Secondary | ICD-10-CM | POA: Insufficient documentation

## 2014-05-23 DIAGNOSIS — F411 Generalized anxiety disorder: Secondary | ICD-10-CM | POA: Insufficient documentation

## 2014-05-23 DIAGNOSIS — R911 Solitary pulmonary nodule: Secondary | ICD-10-CM | POA: Diagnosis not present

## 2014-05-23 DIAGNOSIS — Z86711 Personal history of pulmonary embolism: Secondary | ICD-10-CM | POA: Insufficient documentation

## 2014-05-23 DIAGNOSIS — Z9889 Other specified postprocedural states: Secondary | ICD-10-CM | POA: Insufficient documentation

## 2014-05-23 DIAGNOSIS — Z79899 Other long term (current) drug therapy: Secondary | ICD-10-CM | POA: Diagnosis not present

## 2014-05-23 LAB — CBC WITH DIFFERENTIAL/PLATELET
Basophils Absolute: 0 10*3/uL (ref 0.0–0.1)
Basophils Relative: 0 % (ref 0–1)
EOS ABS: 0.4 10*3/uL (ref 0.0–0.7)
EOS PCT: 4 % (ref 0–5)
HCT: 40.3 % (ref 39.0–52.0)
HEMOGLOBIN: 14.3 g/dL (ref 13.0–17.0)
LYMPHS ABS: 4.9 10*3/uL — AB (ref 0.7–4.0)
LYMPHS PCT: 46 % (ref 12–46)
MCH: 29 pg (ref 26.0–34.0)
MCHC: 35.5 g/dL (ref 30.0–36.0)
MCV: 81.7 fL (ref 78.0–100.0)
MONOS PCT: 8 % (ref 3–12)
Monocytes Absolute: 0.8 10*3/uL (ref 0.1–1.0)
Neutro Abs: 4.4 10*3/uL (ref 1.7–7.7)
Neutrophils Relative %: 42 % — ABNORMAL LOW (ref 43–77)
PLATELETS: 232 10*3/uL (ref 150–400)
RBC: 4.93 MIL/uL (ref 4.22–5.81)
RDW: 14 % (ref 11.5–15.5)
WBC: 10.4 10*3/uL (ref 4.0–10.5)

## 2014-05-23 LAB — PROTIME-INR
INR: 1.31 (ref 0.00–1.49)
Prothrombin Time: 16 seconds — ABNORMAL HIGH (ref 11.6–15.2)

## 2014-05-23 LAB — APTT: APTT: 32 s (ref 24–37)

## 2014-05-23 NOTE — ED Notes (Signed)
Pt states that he has been SOB and gaining and loosing weight for the past month since the new medication of torsemide. Pt states that he has gained 10lbs in 10 days. Pt also states that he has known PE's and was treated for pneumonia (levofloxin) pt states that he is also having penile discharge since Friday.

## 2014-05-24 ENCOUNTER — Emergency Department (HOSPITAL_COMMUNITY)
Admission: EM | Admit: 2014-05-24 | Discharge: 2014-05-24 | Disposition: A | Discharge status: Home / Self Care | Payer: Medicare Other | Attending: Emergency Medicine | Admitting: Emergency Medicine

## 2014-05-24 DIAGNOSIS — R369 Urethral discharge, unspecified: Secondary | ICD-10-CM

## 2014-05-24 DIAGNOSIS — J069 Acute upper respiratory infection, unspecified: Secondary | ICD-10-CM

## 2014-05-24 HISTORY — DX: Other pulmonary embolism without acute cor pulmonale: I26.99

## 2014-05-24 HISTORY — DX: Heart failure, unspecified: I50.9

## 2014-05-24 LAB — URINALYSIS, ROUTINE W REFLEX MICROSCOPIC
BILIRUBIN URINE: NEGATIVE
Glucose, UA: NEGATIVE mg/dL
KETONES UR: NEGATIVE mg/dL
NITRITE: NEGATIVE
PROTEIN: NEGATIVE mg/dL
Specific Gravity, Urine: 1.015 (ref 1.005–1.030)
UROBILINOGEN UA: 0.2 mg/dL (ref 0.0–1.0)
pH: 6 (ref 5.0–8.0)

## 2014-05-24 LAB — COMPREHENSIVE METABOLIC PANEL
ALK PHOS: 88 U/L (ref 39–117)
ALT: 18 U/L (ref 0–53)
AST: 16 U/L (ref 0–37)
Albumin: 3.3 g/dL — ABNORMAL LOW (ref 3.5–5.2)
BUN: 15 mg/dL (ref 6–23)
CO2: 25 meq/L (ref 19–32)
Calcium: 9.2 mg/dL (ref 8.4–10.5)
Chloride: 102 mEq/L (ref 96–112)
Creatinine, Ser: 1.09 mg/dL (ref 0.50–1.35)
GFR, EST NON AFRICAN AMERICAN: 80 mL/min — AB (ref >90)
GLUCOSE: 90 mg/dL (ref 70–99)
Potassium: 3.7 mEq/L (ref 3.7–5.3)
SODIUM: 141 meq/L (ref 137–147)
Total Bilirubin: 0.2 mg/dL — ABNORMAL LOW (ref 0.3–1.2)
Total Protein: 7.2 g/dL (ref 6.0–8.3)

## 2014-05-24 LAB — URINE MICROSCOPIC-ADD ON

## 2014-05-24 LAB — GC/CHLAMYDIA PROBE AMP
CT PROBE, AMP APTIMA: NEGATIVE
GC Probe RNA: NEGATIVE

## 2014-05-24 LAB — PRO B NATRIURETIC PEPTIDE: Pro B Natriuretic peptide (BNP): 451.6 pg/mL — ABNORMAL HIGH (ref 0–125)

## 2014-05-24 MED ORDER — CEFTRIAXONE SODIUM 250 MG IJ SOLR
250.0000 mg | Freq: Once | INTRAMUSCULAR | Status: AC
  Administered 2014-05-24: 250 mg via INTRAMUSCULAR
  Filled 2014-05-24: qty 250

## 2014-05-24 MED ORDER — LIDOCAINE HCL (PF) 1 % IJ SOLN
INTRAMUSCULAR | Status: AC
  Filled 2014-05-24: qty 5

## 2014-05-24 MED ORDER — DOXYCYCLINE HYCLATE 100 MG PO CAPS: 100.0000 mg | ORAL_CAPSULE | Freq: Two times a day (BID) | ORAL | Status: DC

## 2014-05-24 MED ORDER — BENZONATATE 100 MG PO CAPS: 100.0000 mg | ORAL_CAPSULE | Freq: Three times a day (TID) | ORAL | Status: DC

## 2014-05-24 NOTE — ED Notes (Signed)
Pt reports copious amounts of penile discharge that is blood tinged, pt admits to possibility of unprotected sexual intercourse - pt admits to some "tingling" w/ urination. Pt admits to recently being treated for PE w/ anticoagulation therapy. Pt is A&Ox4, no acute distress, speaking complete sentences.

## 2014-05-24 NOTE — Discharge Instructions (Signed)
Upper Respiratory Infection, Adult An upper respiratory infection (URI) is also sometimes known as the common cold. The upper respiratory tract includes the nose, sinuses, throat, trachea, and bronchi. Bronchi are the airways leading to the lungs. Most people improve within 1 week, but symptoms can last up to 2 weeks. A residual cough may last even longer.  CAUSES Many different viruses can infect the tissues lining the upper respiratory tract. The tissues become irritated and inflamed and often become very moist. Mucus production is also common. A cold is contagious. You can easily spread the virus to others by oral contact. This includes kissing, sharing a glass, coughing, or sneezing. Touching your mouth or nose and then touching a surface, which is then touched by another person, can also spread the virus. SYMPTOMS  Symptoms typically develop 1 to 3 days after you come in contact with a cold virus. Symptoms vary from person to person. They may include:  Runny nose.  Sneezing.  Nasal congestion.  Sinus irritation.  Sore throat.  Loss of voice (laryngitis).  Cough.  Fatigue.  Muscle aches.  Loss of appetite.  Headache.  Low-grade fever. DIAGNOSIS  You might diagnose your own cold based on familiar symptoms, since most people get a cold 2 to 3 times a year. Your caregiver can confirm this based on your exam. Most importantly, your caregiver can check that your symptoms are not due to another disease such as strep throat, sinusitis, pneumonia, asthma, or epiglottitis. Blood tests, throat tests, and X-rays are not necessary to diagnose a common cold, but they may sometimes be helpful in excluding other more serious diseases. Your caregiver will decide if any further tests are required. RISKS AND COMPLICATIONS  You may be at risk for a more severe case of the common cold if you smoke cigarettes, have chronic heart disease (such as heart failure) or lung disease (such as asthma), or if  you have a weakened immune system. The very young and very old are also at risk for more serious infections. Bacterial sinusitis, middle ear infections, and bacterial pneumonia can complicate the common cold. The common cold can worsen asthma and chronic obstructive pulmonary disease (COPD). Sometimes, these complications can require emergency medical care and may be life-threatening. PREVENTION  The best way to protect against getting a cold is to practice good hygiene. Avoid oral or hand contact with people with cold symptoms. Wash your hands often if contact occurs. There is no clear evidence that vitamin C, vitamin E, echinacea, or exercise reduces the chance of developing a cold. However, it is always recommended to get plenty of rest and practice good nutrition. TREATMENT  Treatment is directed at relieving symptoms. There is no cure. Antibiotics are not effective, because the infection is caused by a virus, not by bacteria. Treatment may include:  Increased fluid intake. Sports drinks offer valuable electrolytes, sugars, and fluids.  Breathing heated mist or steam (vaporizer or shower).  Eating chicken soup or other clear broths, and maintaining good nutrition.  Getting plenty of rest.  Using gargles or lozenges for comfort.  Controlling fevers with ibuprofen or acetaminophen as directed by your caregiver.  Increasing usage of your inhaler if you have asthma. Zinc gel and zinc lozenges, taken in the first 24 hours of the common cold, can shorten the duration and lessen the severity of symptoms. Pain medicines may help with fever, muscle aches, and throat pain. A variety of non-prescription medicines are available to treat congestion and runny nose. Your caregiver  can make recommendations and may suggest nasal or lung inhalers for other symptoms.  HOME CARE INSTRUCTIONS   Only take over-the-counter or prescription medicines for pain, discomfort, or fever as directed by your  caregiver.  Use a warm mist humidifier or inhale steam from a shower to increase air moisture. This may keep secretions moist and make it easier to breathe.  Drink enough water and fluids to keep your urine clear or pale yellow.  Rest as needed.  Return to work when your temperature has returned to normal or as your caregiver advises. You may need to stay home longer to avoid infecting others. You can also use a face mask and careful hand washing to prevent spread of the virus. SEEK MEDICAL CARE IF:   After the first few days, you feel you are getting worse rather than better.  You need your caregiver's advice about medicines to control symptoms.  You develop chills, worsening shortness of breath, or brown or red sputum. These may be signs of pneumonia.  You develop yellow or brown nasal discharge or pain in the face, especially when you bend forward. These may be signs of sinusitis.  You develop a fever, swollen neck glands, pain with swallowing, or white areas in the back of your throat. These may be signs of strep throat. SEEK IMMEDIATE MEDICAL CARE IF:   You have a fever.  You develop severe or persistent headache, ear pain, sinus pain, or chest pain.  You develop wheezing, a prolonged cough, cough up blood, or have a change in your usual mucus (if you have chronic lung disease).  You develop sore muscles or a stiff neck. Document Released: 05/13/2001 Document Revised: 02/09/2012 Document Reviewed: 03/21/2011 [facility] Patient Information 2015 [place], [place]. This information is not intended to replace advice given to you by your health care provider. Make sure you discuss any questions you have with your health care provider. Gonorrhea Gonorrhea is an infection that can cause serious problems. If left untreated, may   Damage the male or male organs.   Cause women to be unable to have children (sterility).   Harm a fetus, if the infected woman is pregnant.  It is  important to get treatment for gonorrhea as soon as possible. It is also necessary that all your sexual partners be tested for the infection.  CAUSES  Gonorrhea is caused by bacteria called Neisseria gonorrhoeae. The infection is spread from person to person, usually by sexual contact (such as by anal, vaginal, or oral means). A newborn can contract the infection from his or her mother during birth.  SYMPTOMS  Some people with gonorrhea do not have symptoms. Symptoms may be different in females and males.  Females The most common symptoms are:   Pain in the lower abdomen.   Fever with or without chills.  Other symptoms include:   Abnormal vaginal discharge.   Painful intercourse.   Burning or itching of the vagina or lips of the vagina.   Abnormal vaginal bleeding.   Pain when urinating.   Long-lasting (chronic) pain in the lower abdomen, especially during menstruation or intercourse.   Inability to become pregnant.   Going into premature labor.   Irritation, pain, bleeding, or discharge from the rectum. This may occur if the infection was spread by anal sex.   Sore throat or swollen neck lymph nodes. This may occur if the infection was spread by oral sex.  Males The most common symptoms are:   Discharge from the penis.  Pain or burning during urination.   Pain or swelling in the testicles. Other symptoms may include:   Irritation, pain, bleeding, or discharge from the rectum. This may occur if the infection was spread by anal sex.   Sore throat, fever, or swollen neck lymph nodes. This may occur if the infection was spread by oral sex.  DIAGNOSIS  A diagnosis is made after a physical exam is done and a sample of discharge is examined under a microscope for the presence of the bacteria. The discharge may be taken from the urethra, cervix, throat, or rectum.  TREATMENT  Gonorrhea is treated with antibiotic medicines. It is important for treatment to  begin as soon as possible. Early treatment may prevent some problems from developing.  HOME CARE INSTRUCTIONS   Only take over-the-counter or prescription medicines for pain, fever, or discomfort as directed by your health care provider.   Take antibiotics as directed. Make sure you finish them even if you start to feel better. Incomplete treatment will put you at risk for continued infection.   Do not have sex until treatment is complete or as directed by your health care provider.   Follow up with your health care provider as directed.   Not all test results are available during your visit. If your test results are not back during the visit, make an appointment with your health care provider to find out the results. Do not assume everything is normal if you have not heard from your health care provider or the medical facility. It is important for you to follow up on all of your test results.   If you test positive for gonorrhea, inform your recent sexual partners. They need to be checked for gonorrhea even if they do not have symptoms. They may need treatment, even if they test negative for gonorrhea.  SEEK MEDICAL CARE IF:   You develop any bad reaction to the medicine you were prescribed. This may include:   A rash.   Nausea.   Vomiting.   Diarrhea.   Your symptoms do not improve after a few days of taking antibiotics.   Your symptoms get worse.   You develop increased pain, such as in the testicles (for males) or in the abdomen (for females).  SEEK IMMEDIATE MEDICAL CARE IF:  You have a fever or persistent symptoms for more than 2-3 days.   You have a fever and your symptoms suddenly get worse.  MAKE SURE YOU:   Understand these instructions.  Will watch your condition.  Will get help right away if you are not doing well or get worse. Document Released: 11/14/2000 Document Revised: 09/07/2013 Document Reviewed: 05/25/2013 [facility] Patient Information  2015 [place], [place]. This information is not intended to replace advice given to you by your health care provider. Make sure you discuss any questions you have with your health care provider.

## 2014-05-31 NOTE — ED Provider Notes (Signed)
CSN: [id number]     Arrival date & time 05/23/14  2314 History   First MD Initiated Contact with Patient 05/24/14 0252     Chief Complaint  Patient presents with  . Shortness of Breath  . URI  . Penile Discharge     (Consider location/radiation/quality/duration/timing/severity/associated sxs/prior Treatment) HPI Patient presents with penile discharge for several days. He denies any testicular pain or swelling. Has a history of gonorrhea. Denies any abdominal pain nausea or vomiting. Patient also complains of cough, mild shortness of breath and wheezing. Denies any chest pain. No lower extremity swelling or pain. No fever or chills Past Medical History  Diagnosis Date  . Chronic systolic heart failure   . Edema   . Atrial fibrillation   . Obesity, unspecified   . Anxiety state, unspecified   . Psychiatric disorder   . Obstructive sleep apnea   . History of medication noncompliance   . Alcohol abuse   . Shortness of breath   . Migraine   . Pulmonary embolism   . CHF (congestive heart failure)    Past Surgical History  Procedure Laterality Date  . Testicle surgery    . Cardiac catheterization     Family History  Problem Relation Age of Onset  . Hypertension      Family History  . Stroke      Family History  . Diabetes      Family History  . Cancer Mother     brain tumor  . Hypertension Mother   . Diabetes Father   . Heart disease Maternal Grandmother    History  Substance Use Topics  . Smoking status: Current Every Day Smoker -- 1.00 packs/day for 30 years    Types: Cigarettes  . Smokeless tobacco: Never Used     Comment: 8 ciggs per day +  . Alcohol Use: Yes     Comment: nothing to drink since last OV with Dr. Harl Bowie per patient    Review of Systems  Constitutional: Negative for fever and chills.  HENT: Positive for congestion and rhinorrhea. Negative for sinus pressure and sore throat.   Respiratory: Positive for cough, shortness of breath and wheezing.    Cardiovascular: Negative for chest pain, palpitations and leg swelling.  Gastrointestinal: Negative for nausea, vomiting, abdominal pain and diarrhea.  Genitourinary: Positive for discharge. Negative for penile swelling, scrotal swelling, penile pain and testicular pain.  Musculoskeletal: Negative for back pain, myalgias, neck pain and neck stiffness.  Skin: Negative for rash and wound.  Neurological: Negative for dizziness, weakness, light-headedness, numbness and headaches.  All other systems reviewed and are negative.     Allergies  Ace inhibitors and Buspirone  Home Medications   Prior to Admission medications   Medication Sig Start Date End Date Taking? Authorizing Provider  clonazePAM (KLONOPIN) 1 MG tablet Take 1 tablet (1 mg total) by mouth 3 (three) times daily as needed for anxiety. 11/09/13  Yes Alycia Rossetti, MD  escitalopram (LEXAPRO) 10 MG tablet Take 10 mg by mouth daily.   Yes Historical Provider, MD  hydrALAZINE (APRESOLINE) 25 MG tablet Take 1 tablet (25 mg total) by mouth 3 (three) times daily. 12/21/13  Yes Arnoldo Lenis, MD  HYDROcodone-acetaminophen (NORCO/VICODIN) 5-325 MG per tablet Take 1 tablet by mouth every 6 (six) hours as needed for moderate pain. 05/10/14  Yes Alycia Rossetti, MD  levofloxacin (LEVAQUIN) 750 MG tablet Take 750 mg by mouth daily.   Yes Historical Provider, MD  metoprolol succinate (  TOPROL XL) 100 MG 24 hr tablet Take 1 tablet (100 mg total) by mouth daily. Take with or immediately following a meal. 02/07/14  Yes Alycia Rossetti, MD  pantoprazole (PROTONIX) 40 MG tablet Take 1 tablet (40 mg total) by mouth daily. 02/07/14  Yes Alycia Rossetti, MD  Rivaroxaban (XARELTO) 15 MG TABS tablet Take 15 mg by mouth 2 (two) times daily with a meal.   Yes Historical Provider, MD  spironolactone (ALDACTONE) 25 MG tablet Take 25 mg by mouth daily.   Yes Historical Provider, MD  torsemide (DEMADEX) 20 MG tablet Take 20 mg by mouth daily.  09/02/13   Yes Alycia Rossetti, MD  benzonatate (TESSALON) 100 MG capsule Take 1 capsule (100 mg total) by mouth every 8 (eight) hours. 05/24/14   Julianne Rice, MD  doxycycline (VIBRAMYCIN) 100 MG capsule Take 1 capsule (100 mg total) by mouth 2 (two) times daily. One po bid x 7 days 05/24/14   Julianne Rice, MD  NON FORMULARY Bipap, pressure 16/12    Historical Provider, MD   BP 131/87  Pulse 63  Temp(Src) 97.7 F (36.5 C) (Oral)  Resp 24  Wt 414 lb (187.789 kg)  SpO2 100% Physical Exam  Nursing note and vitals reviewed. Constitutional: He is oriented to person, place, and time. He appears well-developed and well-nourished. No distress.  HENT:  Head: Normocephalic and atraumatic.  Mouth/Throat: Oropharynx is clear and moist.  Eyes: EOM are normal. Pupils are equal, round, and reactive to light.  Neck: Normal range of motion. Neck supple.  Cardiovascular: Normal rate and regular rhythm.   Pulmonary/Chest: Effort normal. No respiratory distress. He has wheezes (very mild end expiratory wheezing). He has no rales.  Speaking in full sentences. In no respiratory distress.  Abdominal: Soft. Bowel sounds are normal. He exhibits no distension and no mass. There is no tenderness. There is no rebound and no guarding.  Genitourinary:  Purulent penile discharge. No testicular tenderness. No testicular masses  Musculoskeletal: Normal range of motion. He exhibits no edema and no tenderness.  No calf swelling or tenderness.  Neurological: He is alert and oriented to person, place, and time.  Skin: Skin is warm and dry. No rash noted. No erythema.  Psychiatric: He has a normal mood and affect. His behavior is normal.    ED Course  Procedures (including critical care time) Labs Review Labs Reviewed  PROTIME-INR - Abnormal; Notable for the following:    Prothrombin Time 16.0 (*)    All other components within normal limits  CBC WITH DIFFERENTIAL - Abnormal; Notable for the following:    Neutrophils  Relative % 42 (*)    Lymphs Abs 4.9 (*)    All other components within normal limits  COMPREHENSIVE METABOLIC PANEL - Abnormal; Notable for the following:    Albumin 3.3 (*)    Total Bilirubin 0.2 (*)    GFR calc non Af Amer 80 (*)    All other components within normal limits  URINALYSIS, ROUTINE W REFLEX MICROSCOPIC - Abnormal; Notable for the following:    APPearance CLOUDY (*)    Hgb urine dipstick TRACE (*)    Leukocytes, UA LARGE (*)    All other components within normal limits  PRO B NATRIURETIC PEPTIDE - Abnormal; Notable for the following:    Pro B Natriuretic peptide (BNP) 451.6 (*)    All other components within normal limits  GC/CHLAMYDIA PROBE AMP  APTT  URINE MICROSCOPIC-ADD ON    Imaging Review No  results found.   EKG Interpretation   Date/Time:  Tuesday May 23 2014 23:22:48 EDT Ventricular Rate:  70 PR Interval:  170 QRS Duration: 120 QT Interval:  420 QTC Calculation: 453 R Axis:   4 Text Interpretation:  Sinus rhythm with Premature atrial complexes  Incomplete left bundle branch block Nonspecific T wave abnormality  Abnormal ECG ED PHYSICIAN INTERPRETATION AVAILABLE IN [facility]  Confirmed by TEST, Record ([id number]) on 05/25/2014 7:03:37 AM      MDM   Final diagnoses:  Penile discharge  URI (upper respiratory infection)    Patient's stress test is improved. Question URI. Patient is also treated for STD. He is advised to have all sexual partners treated. Return precautions given    Julianne Rice, MD 05/31/14 [phone]

## 2014-06-12 ENCOUNTER — Telehealth: Payer: Self-pay | Admitting: Family Medicine

## 2014-06-12 MED ORDER — CLONAZEPAM 1 MG PO TABS: 1.0000 mg | ORAL_TABLET | Freq: Three times a day (TID) | ORAL | Status: DC | PRN

## 2014-06-12 NOTE — Telephone Encounter (Signed)
Patient is calling to

## 2014-06-12 NOTE — Telephone Encounter (Signed)
ok 

## 2014-06-12 NOTE — Telephone Encounter (Signed)
Received call from patient.   Requested refill on Clonazepam.   Ok to refill??  Last office visit 05/10/2014.  Last refill 11/09/2013, #2 refills.  [facility], [place]

## 2014-06-12 NOTE — Telephone Encounter (Signed)
Medication called to pharmacy.  Call placed to patient and patient made aware.  

## 2014-06-27 ENCOUNTER — Other Ambulatory Visit: Payer: Self-pay | Admitting: Family Medicine

## 2014-06-27 NOTE — Telephone Encounter (Signed)
Refill appropriate and filled per protocol. 

## 2014-07-18 ENCOUNTER — Telehealth: Payer: Self-pay | Admitting: Family Medicine

## 2014-07-18 NOTE — Telephone Encounter (Signed)
Call returned to patient.   VM full.

## 2014-07-18 NOTE — Telephone Encounter (Signed)
[phone]  Pt is calling wanting to know about some test results

## 2014-07-19 NOTE — Telephone Encounter (Signed)
Call placed to patient.   VM full.

## 2014-07-19 NOTE — Telephone Encounter (Signed)
Call placed to patient.   Patient states that he has questions about getting son into practice.   MD states that patient was approved, we just need records to abstract and to schedule.   Father made aware.

## 2014-08-08 ENCOUNTER — Telehealth: Payer: Self-pay | Admitting: Cardiology

## 2014-08-08 NOTE — Telephone Encounter (Signed)
Moved. Per patient [place] office would be more convenient for him

## 2014-08-11 ENCOUNTER — Encounter: Payer: Self-pay | Admitting: Family Medicine

## 2014-08-11 ENCOUNTER — Ambulatory Visit (INDEPENDENT_AMBULATORY_CARE_PROVIDER_SITE_OTHER): Payer: Medicare Other | Admitting: Family Medicine

## 2014-08-11 VITALS — BP 138/74 | HR 78 | Temp 97.7°F | Resp 16

## 2014-08-11 DIAGNOSIS — I2699 Other pulmonary embolism without acute cor pulmonale: Secondary | ICD-10-CM

## 2014-08-11 DIAGNOSIS — E119 Type 2 diabetes mellitus without complications: Secondary | ICD-10-CM | POA: Diagnosis not present

## 2014-08-11 DIAGNOSIS — F411 Generalized anxiety disorder: Secondary | ICD-10-CM

## 2014-08-11 DIAGNOSIS — F331 Major depressive disorder, recurrent, moderate: Secondary | ICD-10-CM

## 2014-08-11 DIAGNOSIS — I10 Essential (primary) hypertension: Secondary | ICD-10-CM | POA: Diagnosis not present

## 2014-08-11 DIAGNOSIS — G43709 Chronic migraine without aura, not intractable, without status migrainosus: Secondary | ICD-10-CM

## 2014-08-11 DIAGNOSIS — Z23 Encounter for immunization: Secondary | ICD-10-CM

## 2014-08-11 LAB — CBC WITH DIFFERENTIAL/PLATELET
BASOS PCT: 0 % (ref 0–1)
Basophils Absolute: 0 10*3/uL (ref 0.0–0.1)
Eosinophils Absolute: 0.4 10*3/uL (ref 0.0–0.7)
Eosinophils Relative: 5 % (ref 0–5)
HCT: 41.6 % (ref 39.0–52.0)
HEMOGLOBIN: 14.8 g/dL (ref 13.0–17.0)
LYMPHS PCT: 47 % — AB (ref 12–46)
Lymphs Abs: 4 10*3/uL (ref 0.7–4.0)
MCH: 28.8 pg (ref 26.0–34.0)
MCHC: 35.6 g/dL (ref 30.0–36.0)
MCV: 80.9 fL (ref 78.0–100.0)
MONOS PCT: 8 % (ref 3–12)
Monocytes Absolute: 0.7 10*3/uL (ref 0.1–1.0)
NEUTROS ABS: 3.4 10*3/uL (ref 1.7–7.7)
Neutrophils Relative %: 40 % — ABNORMAL LOW (ref 43–77)
Platelets: 271 10*3/uL (ref 150–400)
RBC: 5.14 MIL/uL (ref 4.22–5.81)
RDW: 14.6 % (ref 11.5–15.5)
WBC: 8.6 10*3/uL (ref 4.0–10.5)

## 2014-08-11 LAB — COMPREHENSIVE METABOLIC PANEL
ALBUMIN: 3.9 g/dL (ref 3.5–5.2)
ALT: 15 U/L (ref 0–53)
AST: 12 U/L (ref 0–37)
Alkaline Phosphatase: 78 U/L (ref 39–117)
BUN: 14 mg/dL (ref 6–23)
CHLORIDE: 106 meq/L (ref 96–112)
CO2: 24 mEq/L (ref 19–32)
Calcium: 8.6 mg/dL (ref 8.4–10.5)
Creat: 1.11 mg/dL (ref 0.50–1.35)
GLUCOSE: 86 mg/dL (ref 70–99)
POTASSIUM: 4.1 meq/L (ref 3.5–5.3)
SODIUM: 137 meq/L (ref 135–145)
TOTAL PROTEIN: 7.1 g/dL (ref 6.0–8.3)
Total Bilirubin: 0.7 mg/dL (ref 0.2–1.2)

## 2014-08-11 LAB — HEMOGLOBIN A1C
Hgb A1c MFr Bld: 5.6 % (ref <5.7)
MEAN PLASMA GLUCOSE: 114 mg/dL (ref <117)

## 2014-08-11 MED ORDER — HYDROCODONE-ACETAMINOPHEN 5-325 MG PO TABS: 1.0000 | ORAL_TABLET | Freq: Four times a day (QID) | ORAL | Status: DC | PRN

## 2014-08-11 MED ORDER — ESCITALOPRAM OXALATE 20 MG PO TABS: 20.0000 mg | ORAL_TABLET | Freq: Every day | ORAL | Status: DC

## 2014-08-11 NOTE — Patient Instructions (Addendum)
Get the xray of your neck  Lexapro increased to 20mg  once a day  Continue klonopin Referral to psychiatry in [place]  Mucinex one tablet twice a day  We will call with lab results  F/U 6 weeks

## 2014-08-11 NOTE — Progress Notes (Signed)
Patient ID: [name], male   DOB: 23-May-1966, 48 y.o.   MRN: [id number]   Subjective:    Patient ID: [name], male    DOB: [birth year], 48 y.o.   MRN: [id number]  Patient presents for 3 month F/U, Insomnia, Low Back Pain, Fatigue, Congestion, Discuss Xarelto and Stress/ Anxiety  Patient here with multiple concerns. He's not been compliant with his Xarelto which was being used for probable pulmonary embolism found that his hospital visit back in May. He states that he saw a lot of things on TV and he was concerned about taking the medication therefore he's only been taking it maybe once or twice a week for the past couple months. He states he is fatigued all the time and he is stressed out he is having difficulties with his finances at home. He then went into multiple other complaints from congestion, headaches to back pain 2 insomnia foot and leg pain at times  Medications were reviewed    Review Of Systems:  GEN- denies fatigue, fever, weight loss,weakness, recent illness HEENT- denies eye drainage, change in vision, nasal discharge, CVS- denies chest pain, palpitations RESP- denies SOB, cough, wheeze ABD- denies N/V, change in stools, abd pain GU- denies dysuria, hematuria, dribbling, incontinence MSK- denies joint pain, muscle aches, injury Neuro- denies headache, dizziness, syncope, seizure activity       Objective:    BP 138/74  Pulse 78  Temp(Src) 97.7 F (36.5 C) (Oral)  Resp 16 GEN- NAD, alert and oriented x3, obese  HEENT- PERRL, EOMI, non injected sclera, pink conjunctiva, MMM, oropharynx clear CVS- RRR, no murmur RESP-decreased at bases,CTAB Psych- +anxious appearing, not overly depressed, well groomed, no SI, normal speech EXT- Pedal edema Pulses- Radial 2+      Assessment & Plan:      Problem List Items Addressed This Visit   UNSPECIFIED ESSENTIAL HYPERTENSION   Relevant Orders      Pneumococcal polysaccharide vaccine 23-valent greater than or  equal to 2yo subcutaneous/IM (Completed)   MORBID OBESITY   Relevant Orders      Pneumococcal polysaccharide vaccine 23-valent greater than or equal to 2yo subcutaneous/IM (Completed)   Major depression - Primary (Chronic)   Relevant Medications      escitalopram (LEXAPRO) tablet   Other Relevant Orders      Pneumococcal polysaccharide vaccine 23-valent greater than or equal to 2yo subcutaneous/IM (Completed)   Diabetes mellitus   Relevant Orders      CBC with Differential (Completed)      Comprehensive metabolic panel (Completed)      Hemoglobin A1c (Completed)      Pneumococcal polysaccharide vaccine 23-valent greater than or equal to 2yo subcutaneous/IM (Completed)   ANXIETY STATE, UNSPECIFIED   Relevant Medications      escitalopram (LEXAPRO) tablet   Other Relevant Orders      Pneumococcal polysaccharide vaccine 23-valent greater than or equal to 2yo subcutaneous/IM (Completed)    Other Visit Diagnoses   Need for prophylactic vaccination against Streptococcus pneumoniae (pneumococcus)        Relevant Orders       Pneumococcal polysaccharide vaccine 23-valent greater than or equal to 2yo subcutaneous/IM (Completed)       Note: This dictation was prepared with Dragon dictation along with smaller phrase technology. Any transcriptional errors that result from this process are unintentional.

## 2014-08-12 ENCOUNTER — Encounter: Payer: Self-pay | Admitting: Family Medicine

## 2014-08-12 NOTE — Assessment & Plan Note (Signed)
Overall looks okay, note he is only taking hydralazine once a day, advised this is to be taken three times a day Cardiology f/u coming up

## 2014-08-12 NOTE — Assessment & Plan Note (Signed)
Declines new meds, has chronic neck pain, migraine, back pain, his obesity, MDD, Anxiety is at the center of all of this Refilled hydrocodone, he rarely takes unless severe

## 2014-08-12 NOTE — Assessment & Plan Note (Signed)
Discussed blood thinners and his concerns about monitoring, plan was for 6 months of treatment He agrees to treat for another 2 months, then we will d/c Xarelto

## 2014-08-12 NOTE — Assessment & Plan Note (Signed)
Given reassurance he can start to exercise again, needs signifcant weight loss

## 2014-08-12 NOTE — Assessment & Plan Note (Signed)
Refer to a new psychiatrist in [place] Multiple meds tried, difficulty with compliance Increase lexapro to 20mg 

## 2014-08-12 NOTE — Assessment & Plan Note (Signed)
Diet controlled, recheck A1C  

## 2014-08-28 ENCOUNTER — Encounter: Payer: Self-pay | Admitting: Cardiology

## 2014-08-28 ENCOUNTER — Ambulatory Visit (INDEPENDENT_AMBULATORY_CARE_PROVIDER_SITE_OTHER): Payer: Medicare Other | Admitting: Cardiology

## 2014-08-28 VITALS — BP 140/84 | HR 58 | Ht 68.0 in | Wt >= 6400 oz

## 2014-08-28 DIAGNOSIS — G4733 Obstructive sleep apnea (adult) (pediatric): Secondary | ICD-10-CM | POA: Diagnosis not present

## 2014-08-28 DIAGNOSIS — I428 Other cardiomyopathies: Secondary | ICD-10-CM

## 2014-08-28 DIAGNOSIS — I4891 Unspecified atrial fibrillation: Secondary | ICD-10-CM | POA: Diagnosis not present

## 2014-08-28 DIAGNOSIS — Z86711 Personal history of pulmonary embolism: Secondary | ICD-10-CM

## 2014-08-28 DIAGNOSIS — I5022 Chronic systolic (congestive) heart failure: Secondary | ICD-10-CM | POA: Diagnosis not present

## 2014-08-28 MED ORDER — ISOSORBIDE DINITRATE 5 MG PO TABS: 5.0000 mg | ORAL_TABLET | Freq: Three times a day (TID) | ORAL | Status: DC

## 2014-08-28 MED ORDER — HYDRALAZINE HCL 25 MG PO TABS: 25.0000 mg | ORAL_TABLET | Freq: Three times a day (TID) | ORAL | Status: DC

## 2014-08-28 MED ORDER — ISOSORB DINITRATE-HYDRALAZINE 20-37.5 MG PO TABS: 1.0000 | ORAL_TABLET | Freq: Three times a day (TID) | ORAL | Status: DC

## 2014-08-28 NOTE — Patient Instructions (Addendum)
Your physician recommends that you schedule a follow-up appointment in: 1 month     Your physician has recommended you make the following change in your medication:     Add medication Isordil 5 mg three times a day     Continue hydralazine 25 mg three times a day          Thank you for choosing [facility] !

## 2014-08-28 NOTE — Addendum Note (Signed)
Addended by: Barbarann Ehlers A on: 08/28/2014 02:29 PM   Modules accepted: Orders, Medications

## 2014-08-28 NOTE — Progress Notes (Signed)
Clinical Summary Mr. Boen is a 48 y.o.male seen today for follow up of the following medical problems.   1. NICM: thought to be EtOH related. Previous cath w/ patent coronaries  - Last echo 03/2014  LVEF 30-35% - his medication regimen has been limited due to side effects and mixed compliance. He has missed his last few appointments with [place]. He reports he often forgets to take his medications. He developed angioedema on ACE-I, and headaches on losartan.  - previously tried to increase his Toprol XL, however he notes significant fatigue and sleepiness and thus has stayed on 100mg  daily -Denies any orthopnea or PND, does have LE edema.   2. Afib  - seems to have been isolated episode, review of clinic notes report no recurrence, has not been on anticoag.  - no symptomatic episodes noted on recent event monitor  - no current symptoms   3. OSA  - new machine recently, working on settings with Dr Gwenette Greet.  - still has significant day time somnolence.   4. Depression  - referred to new pyschiatrist by pcp, lexapro recently increased to 20mg  daily  5. PE  - diagnosed during recent admission at [facility] 03/2014 - recent VQ scan 03/2014 high probability PE, on anticoag by pcp - from pcp notes he has not been compliant with his xarelto   Past Medical History  Diagnosis Date  . Chronic systolic heart failure   . Edema   . Atrial fibrillation   . Obesity, unspecified   . Anxiety state, unspecified   . Psychiatric disorder   . Obstructive sleep apnea   . History of medication noncompliance   . Alcohol abuse   . Shortness of breath   . Migraine   . Pulmonary embolism   . CHF (congestive heart failure)      Allergies  Allergen Reactions  . Ace Inhibitors     REACTION: throat swelling  . Buspirone     dizziness     Current Outpatient Prescriptions  Medication Sig Dispense Refill  . clonazePAM (KLONOPIN) 1 MG tablet Take 1 tablet (1 mg total) by mouth 3 (three)  times daily as needed for anxiety.  90 tablet  2  . escitalopram (LEXAPRO) 20 MG tablet Take 1 tablet (20 mg total) by mouth daily.  30 tablet  3  . hydrALAZINE (APRESOLINE) 25 MG tablet Take 1 tablet (25 mg total) by mouth 3 (three) times daily.  90 tablet  6  . HYDROcodone-acetaminophen (NORCO/VICODIN) 5-325 MG per tablet Take 1 tablet by mouth every 6 (six) hours as needed for moderate pain.  20 tablet  0  . metoprolol succinate (TOPROL-XL) 100 MG 24 hr tablet TAKE 1 TABLET BY MOUTH DAILY WITH OR IMMEDIATELY FOLLOWING A MEAL **DOSE CHANGE**  30 tablet  3  . NON FORMULARY Bipap, pressure 16/12      . pantoprazole (PROTONIX) 40 MG tablet Take 1 tablet (40 mg total) by mouth daily.  90 tablet  1  . spironolactone (ALDACTONE) 25 MG tablet Take 25 mg by mouth daily.      Marland Kitchen torsemide (DEMADEX) 20 MG tablet Take 20 mg by mouth daily.       . [DISCONTINUED] pravastatin (PRAVACHOL) 40 MG tablet Take 40 mg by mouth daily.      . [DISCONTINUED] sertraline (ZOLOFT) 50 MG tablet Take 1 tablet (50 mg total) by mouth daily. Take  50mg  daily for 1 week then increase to 100mg  daily  60 tablet  2   No current facility-administered medications for this visit.     Past Surgical History  Procedure Laterality Date  . Testicle surgery    . Cardiac catheterization       Allergies  Allergen Reactions  . Ace Inhibitors     REACTION: throat swelling  . Buspirone     dizziness      Family History  Problem Relation Age of Onset  . Hypertension      Family History  . Stroke      Family History  . Diabetes      Family History  . Cancer Mother     brain tumor  . Hypertension Mother   . Diabetes Father   . Heart disease Maternal Grandmother      Social History Mr. Ratz reports that he has been smoking Cigarettes.  He has a 30 pack-year smoking history. He has never used smokeless tobacco. Mr. Limb reports that he drinks alcohol.   Review of Systems CONSTITUTIONAL: No weight loss, fever,  chills, weakness or fatigue.  HEENT: Eyes: No visual loss, blurred vision, double vision or yellow sclerae.No hearing loss, sneezing, congestion, runny nose or sore throat.  SKIN: No rash or itching.  CARDIOVASCULAR: per HPI RESPIRATORY: No shortness of breath, cough or sputum.  GASTROINTESTINAL: No anorexia, nausea, vomiting or diarrhea. No abdominal pain or blood.  GENITOURINARY: No burning on urination, no polyuria NEUROLOGICAL: No headache, dizziness, syncope, paralysis, ataxia, numbness or tingling in the extremities. No change in bowel or bladder control.  MUSCULOSKELETAL: No muscle, back pain, joint pain or stiffness.  LYMPHATICS: No enlarged nodes. No history of splenectomy.  PSYCHIATRIC: No history of depression or anxiety.  ENDOCRINOLOGIC: No reports of sweating, cold or heat intolerance. No polyuria or polydipsia.  Marland Kitchen   Physical Examination p 58 bp 140/84 Wt 407 lbs BMI 62 Gen: resting comfortably, no acute distress HEENT: no scleral icterus, pupils equal round and reactive, no palptable cervical adenopathy,  CV: RRR, no m/r/g, no JVD, no carotid bruits Resp: Clear to auscultation bilaterally GI: abdomen is soft, non-tender, non-distended, normal bowel sounds, no hepatosplenomegaly MSK: extremities are warm, no edema.  Skin: warm, no rash Neuro:  no focal deficits Psych: appropriate affect   Diagnostic Studies Echo 08/2012: LVEF 35-40%, moderate LVH, mild MR, mode LAE,   Event monitor 08/2013: PVCs, symptomatic 3 beat run of NSVT. Asymptomatic 8 beat run of NSVT. No symptomatic afib.   Echo 03/2014  LVEF 30-35%, abnormal diastolic function grade not described, normal RV   VQ scan 03/2014  Multiple perfusion defects right lung concerning for PE     Assessment and Plan  1. NICM/Chronic systolic hF - last 44-03% by echo 03/2014  - medical therapy has been limited by side effects and mixed compliance - will stop hydral, start bidil tid - encouraged him to get a pill  box to help him organize and remember to take his meds. Emphasized importance about complinance, and that ultimately pending response to medical therapy we will need to consider an ICD  2. Afib  - remote unclear history in the past, no repeat episodes documented. No symptomatic events noted on recent event monitor  - continue to follow clinically. Does not have clear indication for anticoagulation at this time   3. OSA  - continue home bipap, he has a new machine now that seems to work better   4. Depression  - followed by pcp, recently referred to new psych in [place].   5.  PE - will request records from [facility], at this time not clear if thought to be provoked or unprovoked PE. He has been on xarelto managed by his pcp    F/ 1 month    Arnoldo Lenis, M.D.

## 2014-09-04 ENCOUNTER — Other Ambulatory Visit: Payer: Self-pay | Admitting: *Deleted

## 2014-09-04 MED ORDER — TORSEMIDE 20 MG PO TABS: 20.0000 mg | ORAL_TABLET | Freq: Every day | ORAL | Status: DC | PRN

## 2014-09-04 NOTE — Telephone Encounter (Signed)
Received fax requesting refill on torsemide.   Prescription sent to pharmacy.

## 2014-09-19 ENCOUNTER — Ambulatory Visit (INDEPENDENT_AMBULATORY_CARE_PROVIDER_SITE_OTHER): Payer: Medicare Other | Admitting: Family Medicine

## 2014-09-19 ENCOUNTER — Encounter: Payer: Self-pay | Admitting: Family Medicine

## 2014-09-19 VITALS — BP 138/80 | HR 88 | Temp 98.2°F | Resp 16

## 2014-09-19 DIAGNOSIS — J069 Acute upper respiratory infection, unspecified: Secondary | ICD-10-CM

## 2014-09-19 MED ORDER — CLONAZEPAM 1 MG PO TABS: 1.0000 mg | ORAL_TABLET | Freq: Three times a day (TID) | ORAL | Status: DC | PRN

## 2014-09-19 MED ORDER — ESCITALOPRAM OXALATE 20 MG PO TABS: 20.0000 mg | ORAL_TABLET | Freq: Every day | ORAL | Status: DC

## 2014-09-19 NOTE — Progress Notes (Signed)
Patient ID: NAEL PETROSYAN, male   DOB: 08/06/1966, 48 y.o.   MRN: [id number]   Subjective:    Patient ID: LELON IKARD, male    DOB: [birth year], 48 y.o.   MRN: [id number]  Patient presents for Illness  patient here with scratchy throat cough nasal drainage and chills that started last night. Positive sick contact with his wife who has a cold. No fever. No shortness of breath no chest pain. He has not taken any over-the-counter medications.  Of note on medication review he stopped his isosorbide secondary to worsening headaches with the medications he is supposed to take this 3 times a day. He is taking all of the other medicines per report.    Review Of Systems:  GEN- + fatigue, fever, weight loss,weakness, recent illness HEENT- denies eye drainage, change in vision,+ nasal discharge, CVS- denies chest pain, palpitations RESP- denies SOB, +cough, wheeze ABD- denies N/V, change in stools, abd pain Neuro- denies headache, +dizziness, syncope, seizure activity       Objective:    BP 138/80  Pulse 88  Temp(Src) 98.2 F (36.8 C) (Oral)  Resp 16  SpO2 98% GEN- NAD, alert and oriented x3, non toxic appearing HEENT- PERRL, EOMI, non injected sclera, pink conjunctiva, MMM, oropharynx mild injection, TM clear bilat no effusion, no maxillary sinus tenderness,,  + clear Nasal drainage  Neck- Supple, no LAD CVS- RRR, no murmur RESP-clear no wheeze, no rales, no rhonchi, decreased at bases  EXT- trace pedal edema Pulses- Radial 2+         Assessment & Plan:      Problem List Items Addressed This Visit   None    Visit Diagnoses   Viral URI    -  Primary    Viral symptoms less than 24 hours, discussed course of illness, no antibiotics needed at this time, take mucinex DM, nasal saline, no decongestants       Note: This dictation was prepared with Dragon dictation along with smaller phrase technology. Any transcriptional errors that result from this process are unintentional.

## 2014-09-19 NOTE — Addendum Note (Signed)
Addended by: Vic Blackbird F on: 09/19/2014 12:25 PM   Modules accepted: Orders

## 2014-09-19 NOTE — Patient Instructions (Signed)
Mucinex DM Nasal saline Rest and fluids Call if not better F/U as previous

## 2014-09-25 ENCOUNTER — Ambulatory Visit: Payer: Medicare Other | Admitting: Family Medicine

## 2014-09-29 ENCOUNTER — Ambulatory Visit: Payer: Medicare Other | Admitting: Family Medicine

## 2014-10-10 ENCOUNTER — Ambulatory Visit (INDEPENDENT_AMBULATORY_CARE_PROVIDER_SITE_OTHER): Payer: Medicare Other | Admitting: Cardiology

## 2014-10-10 ENCOUNTER — Encounter: Payer: Self-pay | Admitting: Cardiology

## 2014-10-10 VITALS — BP 100/68 | HR 64 | Ht 68.0 in | Wt >= 6400 oz

## 2014-10-10 DIAGNOSIS — I5022 Chronic systolic (congestive) heart failure: Secondary | ICD-10-CM | POA: Diagnosis not present

## 2014-10-10 MED ORDER — FLUTICASONE PROPIONATE 50 MCG/ACT NA SUSP: 2.0000 | Freq: Every day | NASAL | Status: DC

## 2014-10-10 NOTE — Patient Instructions (Addendum)
Your physician recommends that you schedule a follow-up appointment in: 1 month   STOP Hydralazine  Take Flonase as directed   You have been referred to nutritionist   Your physician has requested that you have an echocardiogram. Echocardiography is a painless test that uses sound waves to create images of your heart. It provides your doctor with information about the size and shape of your heart and how well your heart's chambers and valves are working. This procedure takes approximately one hour. There are no restrictions for this procedure.             Thank you for choosing [facility] !

## 2014-10-10 NOTE — Progress Notes (Signed)
Clinical Summary Mr. Anglin is a 48 y.o.male seen today for follow up of the following medical problems. This is a focused visit on his chronic systolic heart failure, for more detailed medical history refer to prior clinic notes  1. NICM: thought to be EtOH related. Previous cath w/ patent coronaries  - Last echo 03/2014 LVEF 30-35% - his medication regimen has been limited due to side effects and mixed compliance, he missed some of our appointments which has slowed down our ability to titrate his medications. - He developed angioedema on ACE-I, and headaches on losartan.  - we previously tried to increase his Toprol XL, however he had significant fatigue and dose was cut back to 100mg  dialy. Hydral/nitrates causes headaches and he has stopped taking.  - currently stable and tolerating Toprol XL 100mg  and aldactone 25mg  daily - denies any significant weight gain, no LE edema. Can have some DOE.    Past Medical History  Diagnosis Date  . Chronic systolic heart failure   . Edema   . Atrial fibrillation   . Obesity, unspecified   . Anxiety state, unspecified   . Psychiatric disorder   . Obstructive sleep apnea   . History of medication noncompliance   . Alcohol abuse   . Shortness of breath   . Migraine   . Pulmonary embolism   . CHF (congestive heart failure)      Allergies  Allergen Reactions  . Ace Inhibitors     REACTION: throat swelling  . Buspirone     dizziness     Current Outpatient Prescriptions  Medication Sig Dispense Refill  . clonazePAM (KLONOPIN) 1 MG tablet Take 1 tablet (1 mg total) by mouth 3 (three) times daily as needed for anxiety. 90 tablet 2  . escitalopram (LEXAPRO) 20 MG tablet Take 1 tablet (20 mg total) by mouth daily. 30 tablet 3  . hydrALAZINE (APRESOLINE) 25 MG tablet Take 1 tablet (25 mg total) by mouth 3 (three) times daily. 270 tablet 3  . HYDROcodone-acetaminophen (NORCO/VICODIN) 5-325 MG per tablet Take 1 tablet by mouth every 6  (six) hours as needed for moderate pain. 20 tablet 0  . metoprolol succinate (TOPROL-XL) 100 MG 24 hr tablet TAKE 1 TABLET BY MOUTH DAILY WITH OR IMMEDIATELY FOLLOWING A MEAL **DOSE CHANGE** 30 tablet 3  . NON FORMULARY Bipap, pressure 16/12    . pantoprazole (PROTONIX) 40 MG tablet Take 1 tablet (40 mg total) by mouth daily. 90 tablet 1  . rivaroxaban (XARELTO) 20 MG TABS tablet Take 20 mg by mouth daily with supper.    Marland Kitchen spironolactone (ALDACTONE) 25 MG tablet Take 25 mg by mouth daily.    Marland Kitchen torsemide (DEMADEX) 20 MG tablet Take 1 tablet (20 mg total) by mouth daily as needed. 30 tablet 3  . [DISCONTINUED] pravastatin (PRAVACHOL) 40 MG tablet Take 40 mg by mouth daily.    . [DISCONTINUED] sertraline (ZOLOFT) 50 MG tablet Take 1 tablet (50 mg total) by mouth daily. Take  50mg  daily for 1 week then increase to 100mg  daily 60 tablet 2   No current facility-administered medications for this visit.     Past Surgical History  Procedure Laterality Date  . Testicle surgery    . Cardiac catheterization       Allergies  Allergen Reactions  . Ace Inhibitors     REACTION: throat swelling  . Buspirone     dizziness      Family History  Problem Relation Age of  Onset  . Hypertension      Family History  . Stroke      Family History  . Diabetes      Family History  . Cancer Mother     brain tumor  . Hypertension Mother   . Diabetes Father   . Heart disease Maternal Grandmother      Social History Mr. Dilauro reports that he has been smoking Cigarettes.  He has a 30 pack-year smoking history. He has never used smokeless tobacco. Mr. Egnor reports that he drinks alcohol.   Review of Systems CONSTITUTIONAL: No weight loss, fever, chills, weakness or fatigue.  HEENT: Eyes: No visual loss, blurred vision, double vision or yellow sclerae.No hearing loss, sneezing, congestion, runny nose or sore throat.  SKIN: No rash or itching.  CARDIOVASCULAR: per HPI RESPIRATORY: No shortness of  breath, cough or sputum.  GASTROINTESTINAL: No anorexia, nausea, vomiting or diarrhea. No abdominal pain or blood.  GENITOURINARY: No burning on urination, no polyuria NEUROLOGICAL: + headaches MUSCULOSKELETAL: No muscle, back pain, joint pain or stiffness.  LYMPHATICS: No enlarged nodes. No history of splenectomy.  PSYCHIATRIC: No history of depression or anxiety.  ENDOCRINOLOGIC: No reports of sweating, cold or heat intolerance. No polyuria or polydipsia.  Marland Kitchen   Physical Examination p 64 bp 100/68 Wt 406 lbs BMI 62 Gen: resting comfortably, no acute distress HEENT: no scleral icterus, pupils equal round and reactive, no palptable cervical adenopathy,  CV: RRR, no m/r/g, no JVD, no carotid bruits Resp: Clear to auscultation bilaterally GI: abdomen is soft, non-tender, non-distended, normal bowel sounds, no hepatosplenomegaly MSK: extremities are warm, no edema.  Skin: warm, no rash Neuro:  no focal deficits Psych: appropriate affect   Diagnostic Studies Echo 08/2012: LVEF 35-40%, moderate LVH, mild MR, mode LAE,   Event monitor 08/2013: PVCs, symptomatic 3 beat run of NSVT. Asymptomatic 8 beat run of NSVT. No symptomatic afib.   Echo 03/2014  LVEF 30-35%, abnormal diastolic function grade not described, normal RV   VQ scan 03/2014  Multiple perfusion defects right lung concerning for PE    Assessment and Plan   1. NICM/Chronic systolic hF - last 84-03% by echo 03/2014  - medical therapy has been limited by side effects and mixed compliance as described in detail above - he is on his maxmially tolerated medical therapy with Toprol XL 100mg  daily and aldactone 25mg  daily. Will repeat echo and consider referral for ICD pending results   2. Obesity - referred to nutrition   F/u 1 months       Arnoldo Lenis, M.D.

## 2014-10-18 ENCOUNTER — Ambulatory Visit (INDEPENDENT_AMBULATORY_CARE_PROVIDER_SITE_OTHER): Payer: Medicare Other | Admitting: Family Medicine

## 2014-10-18 ENCOUNTER — Other Ambulatory Visit: Payer: Self-pay | Admitting: Family Medicine

## 2014-10-18 ENCOUNTER — Encounter: Payer: Self-pay | Admitting: Family Medicine

## 2014-10-18 VITALS — BP 132/84 | HR 76 | Temp 98.2°F | Resp 16

## 2014-10-18 DIAGNOSIS — Z720 Tobacco use: Secondary | ICD-10-CM | POA: Diagnosis not present

## 2014-10-18 DIAGNOSIS — I10 Essential (primary) hypertension: Secondary | ICD-10-CM

## 2014-10-18 DIAGNOSIS — M546 Pain in thoracic spine: Secondary | ICD-10-CM

## 2014-10-18 DIAGNOSIS — F331 Major depressive disorder, recurrent, moderate: Secondary | ICD-10-CM

## 2014-10-18 DIAGNOSIS — F411 Generalized anxiety disorder: Secondary | ICD-10-CM | POA: Diagnosis not present

## 2014-10-18 MED ORDER — NICOTINE 7 MG/24HR TD PT24: 7.0000 mg | MEDICATED_PATCH | Freq: Every day | TRANSDERMAL | Status: DC

## 2014-10-18 MED ORDER — NICOTINE 14 MG/24HR TD PT24: 14.0000 mg | MEDICATED_PATCH | Freq: Every day | TRANSDERMAL | Status: DC

## 2014-10-18 MED ORDER — CLONAZEPAM 1 MG PO TABS: 1.0000 mg | ORAL_TABLET | Freq: Three times a day (TID) | ORAL | Status: DC | PRN

## 2014-10-18 NOTE — Assessment & Plan Note (Signed)
Symptoms unchanged, he has not followed through with psychiatry No change to meds, he is not taking lexapro on regular basis

## 2014-10-18 NOTE — Assessment & Plan Note (Signed)
BP improved, without hydralazine, no hypotension noted

## 2014-10-18 NOTE — Assessment & Plan Note (Signed)
Nicotine patches given

## 2014-10-18 NOTE — Progress Notes (Signed)
Patient ID: YERIEL MINEO, male   DOB: 04/21/1966, 48 y.o.   MRN: [id number]   Subjective:    Patient ID: DAMARIS GEERS, male    DOB: Dec 21, 1965, 48 y.o.   MRN: [id number]  Patient presents for Medication Review/ Refill  patient here to follow medications. He was seen by his cardiologist he already stopped the isosorbide hydralazine was also discontinued as his blood pressures on the lower in and it was not improving his symptoms. He is still taking his metoprolol and his spironolactone. Of note the psychiatrist was called multiple times he states he has been too busy to return the call but knows that he needs to help with his stress and anxiety. He has not been taking his Lexapro to regular basis. He states his Klonopin does not work as well but he still takes it.  He will like to quit smoking and he's been using nicotine patches and is now on the 14 mg patch and requests refills on this.  He also complains of mid back pain states that he feels knots which are tender today felt better when they are massage or he uses heating pad on them.    Review Of Systems:  GEN- denies fatigue, fever, weight loss,weakness, recent illness HEENT- denies eye drainage, change in vision, nasal discharge, CVS- denies chest pain, palpitations RESP- denies SOB, cough, wheeze ABD- denies N/V, change in stools, abd pain GU- denies dysuria, hematuria, dribbling, incontinence MSK- denies joint pain, muscle aches, injury Neuro- denies headache, dizziness, syncope, seizure activity       Objective:    BP 132/84 mmHg  Pulse 76  Temp(Src) 98.2 F (36.8 C) (Oral)  Resp 16 GEN- NAD, alert and oriented x3, obese CVS- RRR, no murmur RESP-CTAB MSK- Spine NT, spasm noted paraspinals in thoracic area, spasm of trapezius, fair ROm due to habitus EXT- trace edema Pulses- Radial 2+        Assessment & Plan:      Problem List Items Addressed This Visit    None      Note: This dictation was prepared  with Dragon dictation along with smaller phrase technology. Any transcriptional errors that result from this process are unintentional.

## 2014-10-18 NOTE — Patient Instructions (Addendum)
[facility] - phone number Nicotine patches as prescribed Continue current medications Use patches for your back or topical creams F/U 3 months

## 2014-10-18 NOTE — Assessment & Plan Note (Signed)
Muscle spasm, due to tension, difficult to evaluate spine no red flags, declines oral meds, topical OTC meds, salon patches

## 2014-10-18 NOTE — Telephone Encounter (Signed)
Refill appropriate and filled per protocol. 

## 2014-11-04 ENCOUNTER — Other Ambulatory Visit: Payer: Self-pay | Admitting: Family Medicine

## 2014-11-06 NOTE — Telephone Encounter (Signed)
Refill appropriate and filled per protocol. 

## 2014-11-08 ENCOUNTER — Other Ambulatory Visit: Payer: Self-pay | Admitting: *Deleted

## 2014-11-08 MED ORDER — PANTOPRAZOLE SODIUM 40 MG PO TBEC: 40.0000 mg | DELAYED_RELEASE_TABLET | Freq: Every day | ORAL | Status: DC

## 2014-11-08 NOTE — Telephone Encounter (Signed)
Patient in office with son.   Reports that he requires refill on Protonix.   Prescription sent to pharmacy.

## 2014-11-09 ENCOUNTER — Encounter: Payer: Medicare Other | Admitting: Adult Health

## 2014-11-15 ENCOUNTER — Ambulatory Visit: Payer: Medicare Other | Admitting: Dietician

## 2014-11-28 ENCOUNTER — Encounter: Payer: Self-pay | Admitting: *Deleted

## 2014-12-05 ENCOUNTER — Encounter: Payer: Self-pay | Admitting: Pulmonary Disease

## 2014-12-05 ENCOUNTER — Ambulatory Visit (INDEPENDENT_AMBULATORY_CARE_PROVIDER_SITE_OTHER): Payer: Medicare Other | Admitting: Pulmonary Disease

## 2014-12-05 VITALS — BP 138/80 | HR 66 | Temp 97.0°F | Ht 68.5 in | Wt >= 6400 oz

## 2014-12-05 DIAGNOSIS — G4733 Obstructive sleep apnea (adult) (pediatric): Secondary | ICD-10-CM | POA: Diagnosis not present

## 2014-12-05 NOTE — Assessment & Plan Note (Signed)
The patient has gotten his new BiPAP device, and feels that he is doing well with this overall. His download shows excellent compliance, and good control of his AHI. He is having a lot of mask leak, but just got a new mask from his home care company.  I have asked him to keep up with his mask cushion changes, and to continue working on weight loss. He is definitely making progress in his weight.

## 2014-12-05 NOTE — Patient Instructions (Signed)
Continue on bipap, and keep up with mask changes and supplies. Will check your oxygen level overnight on bipap but off your oxygen.  Will call you with results. Keep working on weight loss.  You are making progress. followup with me again in one year if you are doing well.

## 2014-12-05 NOTE — Progress Notes (Signed)
   Subjective:    Patient ID: [name], male    DOB: Jan 18, 1966, 49 y.o.   MRN: [id number]  HPI The patient comes in today for follow-up of his obstructive sleep apnea. He is wearing his bilevel compliantly by the download, with excellent control of his AHI. He is having a lot of mask leak issues, but just got a new mask from his home care company. I have reminded him that he needs to replace his mask cushion every 3 months. Overall he feels that he is doing well with the device. He is also lost 11 pounds since last visit.   Review of Systems  Constitutional: Negative for fever and unexpected weight change.  HENT: Negative for congestion, dental problem, ear pain, nosebleeds, postnasal drip, rhinorrhea, sinus pressure, sneezing, sore throat and trouble swallowing.   Eyes: Negative for redness and itching.  Respiratory: Negative for cough, chest tightness, shortness of breath and wheezing.   Cardiovascular: Negative for palpitations and leg swelling.  Gastrointestinal: Negative for nausea and vomiting.  Genitourinary: Negative for dysuria.  Musculoskeletal: Negative for joint swelling.  Skin: Negative for rash.  Neurological: Negative for headaches.  Hematological: Does not bruise/bleed easily.  Psychiatric/Behavioral: Negative for dysphoric mood. The patient is not nervous/anxious.        Objective:   Physical Exam Morbidly obese male in no acute distress Nose without purulence or discharge noted No skin breakdown or pressure necrosis from the C Pap mask Neck without lymphadenopathy or thyromegaly Lower extremities with edema noted, no cyanosis Alert and oriented, moves all 4 extremities.       Assessment & Plan:

## 2014-12-08 DIAGNOSIS — G4733 Obstructive sleep apnea (adult) (pediatric): Secondary | ICD-10-CM | POA: Diagnosis not present

## 2014-12-12 DIAGNOSIS — F332 Major depressive disorder, recurrent severe without psychotic features: Secondary | ICD-10-CM | POA: Diagnosis not present

## 2014-12-12 DIAGNOSIS — F250 Schizoaffective disorder, bipolar type: Secondary | ICD-10-CM | POA: Diagnosis not present

## 2014-12-13 DIAGNOSIS — F410 Panic disorder [episodic paroxysmal anxiety] without agoraphobia: Secondary | ICD-10-CM | POA: Diagnosis not present

## 2014-12-13 DIAGNOSIS — F250 Schizoaffective disorder, bipolar type: Secondary | ICD-10-CM | POA: Diagnosis not present

## 2014-12-14 ENCOUNTER — Telehealth: Payer: Self-pay | Admitting: *Deleted

## 2014-12-14 DIAGNOSIS — E119 Type 2 diabetes mellitus without complications: Secondary | ICD-10-CM | POA: Diagnosis not present

## 2014-12-14 LAB — HM DIABETES EYE EXAM

## 2014-12-14 NOTE — Telephone Encounter (Signed)
Pt called stating went to [facility] and was prescribed Gabapentin 600mg  takes X4/day and wants to know how you feel about the medication (says you know how he is), pt will not take medications until he hears from you what your thoughts were. Also wanted you to know the counselor there told him to wean back on the klonopin1mg  to take 1-2 tablets daily instead of three esp since he is taking the gabapentin.   Pt would like a call back from you please call.

## 2014-12-15 NOTE — Telephone Encounter (Signed)
Decrease klonopin to twice a day Gabapentin is fine to take, he needs to start with just twice a day with gabapentin and work his self up though, 600mg  four times a day is a lot to start with.

## 2014-12-15 NOTE — Telephone Encounter (Signed)
Call placed to patient and patient made aware.  

## 2014-12-27 ENCOUNTER — Emergency Department (HOSPITAL_COMMUNITY): Payer: Medicare Other

## 2014-12-27 ENCOUNTER — Observation Stay (HOSPITAL_COMMUNITY)
Admission: EM | Admit: 2014-12-27 | Discharge: 2014-12-27 | Disposition: A | Discharge status: Home / Self Care | Payer: Medicare Other | Attending: Emergency Medicine | Admitting: Emergency Medicine

## 2014-12-27 ENCOUNTER — Encounter (HOSPITAL_COMMUNITY): Payer: Self-pay | Admitting: Emergency Medicine

## 2014-12-27 DIAGNOSIS — I480 Paroxysmal atrial fibrillation: Secondary | ICD-10-CM | POA: Insufficient documentation

## 2014-12-27 DIAGNOSIS — Z720 Tobacco use: Secondary | ICD-10-CM | POA: Diagnosis present

## 2014-12-27 DIAGNOSIS — F1010 Alcohol abuse, uncomplicated: Secondary | ICD-10-CM | POA: Insufficient documentation

## 2014-12-27 DIAGNOSIS — F17210 Nicotine dependence, cigarettes, uncomplicated: Secondary | ICD-10-CM | POA: Insufficient documentation

## 2014-12-27 DIAGNOSIS — Z9114 Patient's other noncompliance with medication regimen: Secondary | ICD-10-CM | POA: Insufficient documentation

## 2014-12-27 DIAGNOSIS — Z7901 Long term (current) use of anticoagulants: Secondary | ICD-10-CM

## 2014-12-27 DIAGNOSIS — M25569 Pain in unspecified knee: Secondary | ICD-10-CM | POA: Diagnosis not present

## 2014-12-27 DIAGNOSIS — M1711 Unilateral primary osteoarthritis, right knee: Secondary | ICD-10-CM | POA: Diagnosis not present

## 2014-12-27 DIAGNOSIS — R9431 Abnormal electrocardiogram [ECG] [EKG]: Secondary | ICD-10-CM

## 2014-12-27 DIAGNOSIS — R609 Edema, unspecified: Secondary | ICD-10-CM | POA: Diagnosis not present

## 2014-12-27 DIAGNOSIS — F13930 Sedative, hypnotic or anxiolytic use, unspecified with withdrawal, uncomplicated: Secondary | ICD-10-CM

## 2014-12-27 DIAGNOSIS — Z86711 Personal history of pulmonary embolism: Secondary | ICD-10-CM | POA: Diagnosis not present

## 2014-12-27 DIAGNOSIS — F13230 Sedative, hypnotic or anxiolytic dependence with withdrawal, uncomplicated: Secondary | ICD-10-CM

## 2014-12-27 DIAGNOSIS — R0602 Shortness of breath: Principal | ICD-10-CM | POA: Insufficient documentation

## 2014-12-27 DIAGNOSIS — I5022 Chronic systolic (congestive) heart failure: Secondary | ICD-10-CM | POA: Diagnosis not present

## 2014-12-27 DIAGNOSIS — F419 Anxiety disorder, unspecified: Secondary | ICD-10-CM | POA: Diagnosis not present

## 2014-12-27 DIAGNOSIS — G4733 Obstructive sleep apnea (adult) (pediatric): Secondary | ICD-10-CM | POA: Diagnosis not present

## 2014-12-27 DIAGNOSIS — Z79899 Other long term (current) drug therapy: Secondary | ICD-10-CM | POA: Diagnosis not present

## 2014-12-27 DIAGNOSIS — I5042 Chronic combined systolic (congestive) and diastolic (congestive) heart failure: Secondary | ICD-10-CM | POA: Diagnosis present

## 2014-12-27 DIAGNOSIS — I428 Other cardiomyopathies: Secondary | ICD-10-CM

## 2014-12-27 DIAGNOSIS — Z888 Allergy status to other drugs, medicaments and biological substances status: Secondary | ICD-10-CM | POA: Diagnosis not present

## 2014-12-27 DIAGNOSIS — M170 Bilateral primary osteoarthritis of knee: Secondary | ICD-10-CM | POA: Diagnosis present

## 2014-12-27 DIAGNOSIS — R079 Chest pain, unspecified: Secondary | ICD-10-CM | POA: Diagnosis not present

## 2014-12-27 DIAGNOSIS — I10 Essential (primary) hypertension: Secondary | ICD-10-CM | POA: Diagnosis present

## 2014-12-27 DIAGNOSIS — F13939 Sedative, hypnotic or anxiolytic use, unspecified with withdrawal, unspecified: Secondary | ICD-10-CM | POA: Insufficient documentation

## 2014-12-27 DIAGNOSIS — F13239 Sedative, hypnotic or anxiolytic dependence with withdrawal, unspecified: Secondary | ICD-10-CM | POA: Diagnosis present

## 2014-12-27 DIAGNOSIS — R072 Precordial pain: Secondary | ICD-10-CM

## 2014-12-27 DIAGNOSIS — M25862 Other specified joint disorders, left knee: Secondary | ICD-10-CM | POA: Diagnosis not present

## 2014-12-27 LAB — BASIC METABOLIC PANEL
Anion gap: 7 (ref 5–15)
BUN: 15 mg/dL (ref 6–23)
CHLORIDE: 107 mmol/L (ref 96–112)
CO2: 26 mmol/L (ref 19–32)
CREATININE: 1.09 mg/dL (ref 0.50–1.35)
Calcium: 9 mg/dL (ref 8.4–10.5)
GFR, EST NON AFRICAN AMERICAN: 80 mL/min — AB (ref >90)
GLUCOSE: 120 mg/dL — AB (ref 70–99)
Potassium: 3.8 mmol/L (ref 3.5–5.1)
Sodium: 140 mmol/L (ref 135–145)

## 2014-12-27 LAB — CBC
HCT: 39.8 % (ref 39.0–52.0)
Hemoglobin: 14.1 g/dL (ref 13.0–17.0)
MCH: 28.7 pg (ref 26.0–34.0)
MCHC: 35.4 g/dL (ref 30.0–36.0)
MCV: 81.1 fL (ref 78.0–100.0)
Platelets: 228 10*3/uL (ref 150–400)
RBC: 4.91 MIL/uL (ref 4.22–5.81)
RDW: 13.9 % (ref 11.5–15.5)
WBC: 8.4 10*3/uL (ref 4.0–10.5)

## 2014-12-27 LAB — I-STAT TROPONIN, ED: TROPONIN I, POC: 0.05 ng/mL (ref 0.00–0.08)

## 2014-12-27 LAB — BRAIN NATRIURETIC PEPTIDE: B NATRIURETIC PEPTIDE 5: 64 pg/mL (ref 0.0–100.0)

## 2014-12-27 MED ORDER — TORSEMIDE 20 MG PO TABS
40.0000 mg | ORAL_TABLET | Freq: Every day | ORAL | Status: DC
  Filled 2014-12-27 (×2): qty 2

## 2014-12-27 MED ORDER — LOSARTAN POTASSIUM 25 MG PO TABS: 12.5000 mg | ORAL_TABLET | Freq: Every day | ORAL | Status: DC

## 2014-12-27 MED ORDER — TORSEMIDE 20 MG PO TABS: 40.0000 mg | ORAL_TABLET | Freq: Every day | ORAL | Status: DC

## 2014-12-27 MED ORDER — POTASSIUM CHLORIDE CRYS ER 20 MEQ PO TBCR: 20.0000 meq | EXTENDED_RELEASE_TABLET | Freq: Every day | ORAL | Status: DC

## 2014-12-27 MED ORDER — LORAZEPAM 1 MG PO TABS: ORAL_TABLET | ORAL | Status: DC

## 2014-12-27 MED ORDER — TORSEMIDE 20 MG PO TABS: 20.0000 mg | ORAL_TABLET | Freq: Every day | ORAL | Status: DC

## 2014-12-27 MED ORDER — LORAZEPAM 1 MG PO TABS
2.0000 mg | ORAL_TABLET | Freq: Once | ORAL | Status: AC
  Administered 2014-12-27: 2 mg via ORAL
  Filled 2014-12-27: qty 2

## 2014-12-27 MED ORDER — LOSARTAN POTASSIUM 25 MG PO TABS
12.5000 mg | ORAL_TABLET | Freq: Every day | ORAL | Status: DC
  Administered 2014-12-27: 12.5 mg via ORAL
  Filled 2014-12-27: qty 0.5

## 2014-12-27 MED ORDER — POTASSIUM CHLORIDE CRYS ER 20 MEQ PO TBCR
20.0000 meq | EXTENDED_RELEASE_TABLET | Freq: Every day | ORAL | Status: DC
Start: 2014-12-27 — End: 2015-01-22

## 2014-12-27 MED ORDER — TORSEMIDE 20 MG PO TABS
40.0000 mg | ORAL_TABLET | ORAL | Status: AC
  Administered 2014-12-27: 40 mg via ORAL
  Filled 2014-12-27: qty 2

## 2014-12-27 MED ORDER — POTASSIUM CHLORIDE CRYS ER 20 MEQ PO TBCR
20.0000 meq | EXTENDED_RELEASE_TABLET | Freq: Once | ORAL | Status: AC
  Administered 2014-12-27: 20 meq via ORAL
  Filled 2014-12-27: qty 1

## 2014-12-27 MED ORDER — HYDROCODONE-ACETAMINOPHEN 5-325 MG PO TABS: 2.0000 | ORAL_TABLET | ORAL | Status: DC | PRN

## 2014-12-27 MED ORDER — FUROSEMIDE 10 MG/ML IJ SOLN
40.0000 mg | Freq: Once | INTRAMUSCULAR | Status: AC
  Administered 2014-12-27: 40 mg via INTRAVENOUS
  Filled 2014-12-27: qty 4

## 2014-12-27 MED ORDER — HYDROCODONE-ACETAMINOPHEN 5-325 MG PO TABS
1.0000 | ORAL_TABLET | Freq: Once | ORAL | Status: AC
  Administered 2014-12-27: 1 via ORAL
  Filled 2014-12-27: qty 1

## 2014-12-27 NOTE — Consult Note (Signed)
[facility] Medical Consultation  [name] HWK:[id number] DOB: 1965/12/14 DOA: 12/27/2014 PCP: Vic Blackbird, MD   Requesting physician: Maryan Rued Date of consultation: 12/27/14 Reason for consultation: chest pain  Impression/Recommendations Principal Problem:   Chest pain: already evaluated by cardiology who feels patient does not need to be admitted from a cardiac perspective. Increase torsemide to 40 mg daily and resume cozaar 25 mg daily. F/u cardiology as outpatient  Active Problems:   Withdrawal from benzodiazepine:  Better after 2 mg ativan.  Will give rx for ativan taper    Osteoarthritis of both knees:  May f/u orthopedics. Xray without fracture, significant effusion    Essential hypertension    Systolic CHF, chronic, compensated  Patient feels much better and wants to go home. Already cleared by cardiology. No need for admission  Chief Complaint: chest pain, shaking, right greater than left knee pain  HPI:  49 yo black male with morbid obesity, chronic systolic heart failure, history of PE on xarelto presents to ED with multiple complaints as above.  Cardiology consulted by EDP and has already seen the patient.  Does not require admission from cardiac perspective.  Stopped klonipin "cold [place]" a few weeks ago. Felt tremulous, dyspneic, chest pain. Received 2 mg ativan and symptoms resolved.  Right greater than left knee pain. Patient unable to say when it started.  Review of Systems:  As per HPI. Complete Systems review otherwise negative  Past Medical History  Diagnosis Date  . Chronic systolic heart failure   . Edema   . Atrial fibrillation   . Obesity, unspecified   . Anxiety state, unspecified   . Psychiatric disorder   . Obstructive sleep apnea   . History of medication noncompliance   . Alcohol abuse   . Shortness of breath   . Migraine   . Pulmonary embolism   . CHF (congestive heart failure)    Past Surgical History  Procedure  Laterality Date  . Testicle surgery    . Cardiac catheterization     Social History:  reports that he has been smoking Cigarettes.  He has a 15 pack-year smoking history. He has never used smokeless tobacco. He reports that he drinks alcohol. He reports that he does not use illicit drugs.  Allergies  Allergen Reactions  . Ace Inhibitors Anaphylaxis  . Buspirone Other (See Comments)    dizziness   Family History  Problem Relation Age of Onset  . Hypertension      Family History  . Stroke      Family History  . Diabetes      Family History  . Cancer Mother     brain tumor  . Hypertension Mother   . Diabetes Father   . Heart disease Maternal Grandmother     Prior to Admission medications   Medication Sig Start Date End Date Taking? Authorizing Provider  fluticasone (FLONASE) 50 MCG/ACT nasal spray Place 2 sprays into both nostrils daily. Patient taking differently: Place 2 sprays into both nostrils as needed.  10/10/14  Yes Arnoldo Lenis, MD  gabapentin (NEURONTIN) 600 MG tablet Take 600 mg by mouth 4 (four) times daily.   Yes Historical Provider, MD  metoprolol succinate (TOPROL-XL) 100 MG 24 hr tablet TAKE 1 TABLET BY MOUTH DAILY WITH OR IMMEDIATELY FOLLOWING A MEAL 11/06/14  Yes Alycia Rossetti, MD  NON FORMULARY Bipap, pressure 16/12 with 2L of O2   Yes Historical Provider, MD  pantoprazole (PROTONIX) 40 MG tablet Take 1 tablet (40  mg total) by mouth daily. 11/08/14  Yes Alycia Rossetti, MD  rivaroxaban (XARELTO) 20 MG TABS tablet Take 20 mg by mouth daily with supper.   Yes Historical Provider, MD  spironolactone (ALDACTONE) 25 MG tablet TAKE 1 TABLET BY MOUTH DAILY 10/18/14  Yes Alycia Rossetti, MD  torsemide (DEMADEX) 20 MG tablet Take 20 mg by mouth daily. Can take another tablet as needed for edema   Yes Historical Provider, MD  clonazePAM (KLONOPIN) 1 MG tablet Take 1 tablet (1 mg total) by mouth 3 (three) times daily as needed for anxiety. Patient not taking:  Reported on 12/27/2014 10/18/14   Alycia Rossetti, MD  LORazepam (ATIVAN) 1 MG tablet 1 tablet daily for a week, then 1 tablet every other day for a week, then 1 tablet as needed 12/27/14   Delfina Redwood, MD  losartan (COZAAR) 25 MG tablet Take 0.5 tablets (12.5 mg total) by mouth daily. 12/27/14   Delfina Redwood, MD  potassium chloride SA (K-DUR,KLOR-CON) 20 MEQ tablet Take 1 tablet (20 mEq total) by mouth daily. 12/28/14 12/31/14  Isaiah Serge, NP  torsemide (DEMADEX) 20 MG tablet Take 1 tablet (20 mg total) by mouth daily. 12/31/14   Isaiah Serge, NP  torsemide (DEMADEX) 20 MG tablet Take 2 tablets (40 mg total) by mouth daily. 12/28/14 12/31/14  Isaiah Serge, NP   Physical Exam: Blood pressure 106/71, pulse 70, temperature 98.3 F (36.8 C), temperature source Oral, resp. rate 17, height 5' 8.5" (1.74 m), weight 190.511 kg (420 lb), SpO2 96 %. Filed Vitals:   12/27/14 1707  BP: 106/71  Pulse: 70  Temp:   Resp: 17   BP 108/63 mmHg  Pulse 69  Temp(Src) 98.3 F (36.8 C) (Oral)  Resp 21  Ht 5' 8.5" (1.74 m)  Wt 190.511 kg (420 lb)  BMI 62.92 kg/m2  SpO2 96%  General Appearance:    Morbidly obese AA male. Talkative. NAD  Head:    Normocephalic, without obvious abnormality, atraumatic  Eyes:    PERRL, conjunctiva/corneas clear, EOM's intact,        Nose:   Nares normal, septum midline, mucosa normal, no drainage   or sinus tenderness  Throat:   Lips, mucosa, and tongue normal; teeth and gums normal  Neck:   Supple, symmetrical, trachea midline, no adenopathy;       thyroid:  No enlargement/tenderness/nodules; no carotid   bruit or JVD  Back:     Symmetric, no curvature, ROM normal, no CVA tenderness  Lungs:     Clear to auscultation bilaterally, respirations unlabored  Chest wall:    No tenderness or deformity  Heart:    Regular rate and rhythm, S1 and S2 normal, no murmur, rub   or gallop  Abdomen:     Soft, non-tender, bowel sounds active. obese  Genitalia:     deferred  Rectal:    deferred  Extremities:   Extremities normal, atraumatic, no cyanosis or edema. Knees minimally tender. Not hot or red. Full ROM  Pulses:   2+ and symmetric all extremities  Skin:   Skin color, texture, turgor normal, no rashes or lesions  Lymph nodes:   Cervical, supraclavicular, and axillary nodes normal  Neurologic:   CNII-XII intact. Normal strength, sensation and reflexes      Throughout. No tremor    Psych: cooperative.   Labs on Admission:  Basic Metabolic Panel:  Recent Labs Lab 12/27/14 1131  NA 140  K 3.8  CL 107  CO2 26  GLUCOSE 120*  BUN 15  CREATININE 1.09  CALCIUM 9.0   Liver Function Tests: No results for input(s): AST, ALT, ALKPHOS, BILITOT, PROT, ALBUMIN in the last 168 hours. No results for input(s): LIPASE, AMYLASE in the last 168 hours. No results for input(s): AMMONIA in the last 168 hours. CBC:  Recent Labs Lab 12/27/14 1131  WBC 8.4  HGB 14.1  HCT 39.8  MCV 81.1  PLT 228   Cardiac Enzymes: No results for input(s): CKTOTAL, CKMB, CKMBINDEX, TROPONINI in the last 168 hours. BNP: Invalid input(s): POCBNP CBG: No results for input(s): GLUCAP in the last 168 hours.  Radiological Exams on Admission: Dg Chest 2 View  12/27/2014   CLINICAL DATA:  Chest pain, bilateral knee swelling  EXAM: CHEST  2 VIEW  COMPARISON:  05/23/2014  FINDINGS: Cardiomediastinal silhouette is stable. No acute infiltrate or pulmonary edema. Previous nodule in right midlung has resolved. Degenerative changes thoracic spine.  IMPRESSION: No active cardiopulmonary disease. Degenerative changes thoracic spine.   Electronically Signed   By: Lahoma Crocker M.D.   On: 12/27/2014 13:13   Dg Knee Complete 4 Views Left  12/27/2014   CLINICAL DATA:  Bilateral knee pain starting yesterday morning  EXAM: LEFT KNEE - COMPLETE 4+ VIEW  COMPARISON:  None.  FINDINGS: Four views of the left knee submitted. No acute fracture or subluxation. Diffuse mild narrowing of joint  space. No joint effusion.  IMPRESSION: No acute fracture or subluxation. Diffuse mild narrowing of joint space. No joint effusion.   Electronically Signed   By: Lahoma Crocker M.D.   On: 12/27/2014 13:20   Dg Knee Complete 4 Views Right  12/27/2014   CLINICAL DATA:  Bilateral knee pain starting yesterday morning  EXAM: RIGHT KNEE - COMPLETE 4+ VIEW  COMPARISON:  None.  FINDINGS: Four views of the right knee submitted. No acute fracture or subluxation. There is diffuse mild narrowing of joint space. Narrowing of patellofemoral joint space. Spurring of patella.  IMPRESSION: No acute fracture or subluxation. Osteoarthritic changes as described above.   Electronically Signed   By: Lahoma Crocker M.D.   On: 12/27/2014 13:21    EKG:  Tracing reviewed. NSR, IVCD.  [place] L [facility]   If 7PM-7AM, please contact night-coverage [email or web address] Password [facility] 12/27/2014, 6:06 PM

## 2014-12-27 NOTE — Discharge Instructions (Signed)
We would like your take 40 mg of torsemide for the 28th, 29th and 30th then beginning on the 31st go back to 20 mg daily  We added Kdur 20 meq 1 daily on the 28th, 29th and 30th only.  We added losartan 12.5 mg daily (half of 25 mg tablet) to your meds.  Other meds as before.  Watch salt in your diet.  Weigh daily Call [phone] if weight climbs more than 3 pounds in a day or 5 pounds in a week. No salt to very little salt in your diet.  No more than 2000 mg in a day. Call if increased shortness of breath or increased swelling.

## 2014-12-27 NOTE — Consult Note (Addendum)
Reason for Consult: chest pressure,  hx of NICM, SOB   Referring Physician: ER MD   PCP:  Vic Blackbird, MD  Primary Cardiologist:Dr. Pilar Grammes is an 49 y.o. male.    Chief Complaint: knee pain and with movement he becomes SOB   HPI: 49 year old male with hx of NICM with cath in 2008 with patent coronary arteries.  He has HTN, OSA with BiPap at night.  Previously on event monitor in 2014 he had brief runs of NSVT,  Pt was to have repeat echo but not yet done.   Last echo 03/2014 LVEF 30-35%  - his medication regimen has been limited due to side effects and mixed compliance, he developed angioedema on ACE-I, and headaches on losartan. We previously tried to increase his Toprol XL, however he had significant fatigue and dose was cut back to 188m dialy. Hydral/nitrates causes headaches and he has stopped taking.    He complains of palpitations 3 X week, sometimes it awakens from sleep.  He has some chest tightness he associates with a knot in his back that improves with massage.  Over last 2 days he has had trouble walking, due to knee pain.  When he attempts to walk he does become SOB but seems to be related to pain.  Difficult to get full picture because he changes subjects frequently.  He recently was [facility] and stopped [place]lon[place] TID.    Currently with chest tightness.  Wife is with pt in room.  Past Medical History  Diagnosis Date  . Chronic systolic heart failure   . Edema   . Atrial fibrillation   . Obesity, unspecified   . Anxiety state, unspecified   . Psychiatric disorder   . Obstructive sleep apnea   . History of medication noncompliance   . Alcohol abuse   . Shortness of breath   . Migraine   . Pulmonary embolism   . CHF (congestive heart failure)     Past Surgical History  Procedure Laterality Date  . Testicle surgery    . Cardiac catheterization      Family  History  Problem Relation Age of Onset  . Hypertension      Family History  . Stroke      Family History  . Diabetes      Family History  . Cancer Mother     brain tumor  . Hypertension Mother   . Diabetes Father   . Heart disease Maternal Grandmother    Social History:  reports that he has been smoking Cigarettes.  He has a 15 pack-year smoking history. He has never used smokeless tobacco. He reports that he drinks alcohol. He reports that he does not use illicit drugs.  He has not had alcohol since Nov.  He is married and has 2 children and 5 grandchildren.  Allergies:  Allergies  Allergen Reactions  . Ace Inhibitors     REACTION: throat swelling  . Buspirone     dizziness    OUTPATIENT Medications: Klonopin 1 mg TID with last dose 12/14/14 Torsemide 20 mg daily Spironolactone 25 mg daily toprol XL 100 mg daily  Xarelto 20 mg daily protonix 40 mg dialuy Gabapentin 600 mg Qid    Results for orders placed or performed during the hospital encounter of 12/27/14 (from the past 48 hour(s))  CBC  Status: None   Collection Time: 12/27/14 11:31 AM  Result Value Ref Range   WBC 8.4 4.0 - 10.5 K/uL   RBC 4.91 4.22 - 5.81 MIL/uL   Hemoglobin 14.1 13.0 - 17.0 g/dL   HCT 39.8 39.0 - 52.0 %   MCV 81.1 78.0 - 100.0 fL   MCH 28.7 26.0 - 34.0 pg   MCHC 35.4 30.0 - 36.0 g/dL   RDW 13.9 11.5 - 15.5 %   Platelets 228 150 - 400 K/uL  Basic metabolic panel     Status: Abnormal   Collection Time: 12/27/14 11:31 AM  Result Value Ref Range   Sodium 140 135 - 145 mmol/L   Potassium 3.8 3.5 - 5.1 mmol/L   Chloride 107 96 - 112 mmol/L   CO2 26 19 - 32 mmol/L   Glucose, Bld 120 (H) 70 - 99 mg/dL   BUN 15 6 - 23 mg/dL   Creatinine, Ser 1.09 0.50 - 1.35 mg/dL   Calcium 9.0 8.4 - 10.5 mg/dL   GFR calc non Af Amer 80 (L) >90 mL/min   GFR calc Af Amer >90 >90 mL/min    Comment: (NOTE) The eGFR has been calculated using the CKD EPI equation. This calculation has not been validated in  all clinical situations. eGFR's persistently <90 mL/min signify possible Chronic Kidney Disease.    Anion gap 7 5 - 15  BNP (order ONLY if patient complains of dyspnea/SOB AND you have documented it for THIS visit)     Status: None   Collection Time: 12/27/14 11:32 AM  Result Value Ref Range   B Natriuretic Peptide 64.0 0.0 - 100.0 pg/mL  I-stat troponin, ED (not at [facility])     Status: None   Collection Time: 12/27/14 11:50 AM  Result Value Ref Range   Troponin i, poc 0.05 0.00 - 0.08 ng/mL   Comment 3            Comment: Due to the release kinetics of cTnI, a negative result within the first hours of the onset of symptoms does not rule out myocardial infarction with certainty. If myocardial infarction is still suspected, repeat the test at appropriate intervals.    Dg Chest 2 View  12/27/2014   CLINICAL DATA:  Chest pain, bilateral knee swelling  EXAM: CHEST  2 VIEW  COMPARISON:  05/23/2014  FINDINGS: Cardiomediastinal silhouette is stable. No acute infiltrate or pulmonary edema. Previous nodule in right midlung has resolved. Degenerative changes thoracic spine.  IMPRESSION: No active cardiopulmonary disease. Degenerative changes thoracic spine.   Electronically Signed   By: Lahoma Crocker M.D.   On: 12/27/2014 13:13   Dg Knee Complete 4 Views Left  12/27/2014   CLINICAL DATA:  Bilateral knee pain starting yesterday morning  EXAM: LEFT KNEE - COMPLETE 4+ VIEW  COMPARISON:  None.  FINDINGS: Four views of the left knee submitted. No acute fracture or subluxation. Diffuse mild narrowing of joint space. No joint effusion.  IMPRESSION: No acute fracture or subluxation. Diffuse mild narrowing of joint space. No joint effusion.   Electronically Signed   By: Lahoma Crocker M.D.   On: 12/27/2014 13:20   Dg Knee Complete 4 Views Right  12/27/2014   CLINICAL DATA:  Bilateral knee pain starting yesterday morning  EXAM: RIGHT KNEE - COMPLETE 4+ VIEW  COMPARISON:  None.  FINDINGS: Four views of the right knee  submitted. No acute fracture or subluxation. There is diffuse mild narrowing of joint space. Narrowing of patellofemoral joint  space. Spurring of patella.  IMPRESSION: No acute fracture or subluxation. Osteoarthritic changes as described above.   Electronically Signed   By: Lahoma Crocker M.D.   On: 12/27/2014 13:21    ROS: General:no colds or fevers, no weight changes Skin:no rashes or ulcers HEENT:no blurred vision, no congestion CV:see HPI PUL:see HPI GI:no diarrhea constipation or melena, no indigestion GU:no hematuria, no dysuria MS:no joint pain, no claudication Neuro:no syncope, no lightheadedness Endo:no diabetes, no thyroid disease   Blood pressure 143/94, pulse 74, temperature 98.3 F (36.8 C), temperature source Oral, resp. rate 17, height 5' 8.5" (1.74 m), weight 420 lb (190.511 kg), SpO2 99 %.  Wt Readings from Last 3 Encounters:  12/27/14 420 lb (190.511 kg)  12/05/14 426 lb (193.232 kg)  10/10/14 406 lb (184.16 kg)    PE: General:Pleasant affect, NAD Skin:Warm and dry, brisk capillary refill HEENT:normocephalic, sclera clear, mucus membranes moist Neck: Thick, JVP 8 cm, no bruits  Heart:S1S2 RRR without murmur, gallup, rub or click Lungs:clear without rales, rhonchi, or wheezes MAU:QJFH, non tender, + BS, do not palpate liver spleen or masses Ext:no lower ext edema, 2+ pedal pulses, 2+ radial pulses Neuro:alert and oriented, MAE, follows commands, + facial symmetry    Assessment/Plan See below attending thoughts   [facility]  Nurse Practitioner Certified [place] Pager [phone] or after 5pm or weekends call [phone] 12/27/2014, 4:14 PM   Patient seen with NP, agree with the above note.  Patient has long histroy of nonischemic cardiomyopathy with NSVT, 1 prior episode of atrial fibrillation, and PE in 5/15.  He was admitted for knee pain and dyspnea.  The knee pain began acutely and seems to be his chief complaint.  He also has had  dyspnea and anxiety since stopping Klonopin abruptly and starting gabapentin earlier this month.  1. Dyspnea: He is not markedly volume overloaded on exam and BNP normal (though could be false normal given obesity).  His symptoms may be more related to benzo withdrawal than anything else.   2. Chronic systolic CHF: EF 38-93% on echo in 5/15, nonischemic cardiomyopathy.  Cannot rule out mild volume overload contributing to dyspnea.  But as above, think anxiety after stopping Klonopin abruptly may be contributing to this.  - Will give Lasix 40 mg IV x 1.  - Will increase torsemide to 40 mg daily x 3 days then back to 20 mg daily.  He will also take KDur 20 mEq daily x 3 days only.   - Continue Toprol XL and spironolactone.  - Angioedema with ACEI, headaches with Bidi.  ?headaches with losartan.  He wants to try losartan again, which I think is reasonable.  This risk of cross-reactivity in terms of angioedema between ACEI and ARB is not large.  Will start losartan 12.5 mg daily.  - Will need repeat echo, either done in the hospital now or as outpatient to determine candidacy for ICD.  QRS is not wide enough that CRT would be likely to be of any benefit.  - If he goes home today, followup in 1 week in CHF clinic.  He no longer lives in [place] and wants to be seen by cardiology in [place].  3. PE: He is on Xarelto.  Uncertain if this was provoked or not.  Occurred in 5/15.  Would plan on long-term anticoagulation.  4. Atrial fibrillation: Paroxysmal, only 1 documented episode.  5. NSVT: Patient has had some palpitations, follow on telemetry.   I do not think  he needs admission for cardiac issues.  Will let ER MD determine further disposition.   [name] 12/27/2014 5:05 PM

## 2014-12-27 NOTE — ED Notes (Addendum)
Pt c/o shortness of breath, chest pain, knee pain, both arms feel "like they have needles in them" -- uses BIPAP with O2 at night, woke up with chills. Pt states has a history of PE> taking xeralto for this

## 2014-12-27 NOTE — ED Provider Notes (Signed)
Cards has cleared pt for d/c, please see Dr. Rowe Pavy note.   Ernestina Patches, MD 12/28/14 1251

## 2014-12-27 NOTE — ED Provider Notes (Signed)
CSN: [id number]     Arrival date & time 12/27/14  1120 History   First MD Initiated Contact with Patient 12/27/14 1155     Chief Complaint  Patient presents with  . Shortness of Breath  . Knee Pain     (Consider location/radiation/quality/duration/timing/severity/associated sxs/prior Treatment) HPI Comments: Pt recently stopped clonipin and started gabapentin.  Stopped klonopin cold [place] and since that time has had worsening leg and arm cramps, feeling shaky and more chest tightness which he states he gets with his heart and also with his anxiety attacks.  Patient is a 49 y.o. male presenting with shortness of breath and knee pain. The history is provided by the patient.  Shortness of Breath Severity:  Moderate Onset quality:  Gradual Duration:  3 days Timing:  Constant Progression:  Worsening Chronicity:  Recurrent Context: activity   Context comment:  Lying down flat Relieved by: The BiPAP mask he wears at night he's been wearing during the day as well to catch his breath. Worsened by:  Activity Ineffective treatments:  Diuretics and oxygen Associated symptoms: chest pain and cough   Associated symptoms: no abdominal pain, no diaphoresis, no fever, no sputum production, no vomiting and no wheezing   Associated symptoms comment:  Chills Risk factors: alcohol use   Risk factors comment:  Recently stopped Klonopin and went on gabapentin Knee Pain Associated symptoms: no fever     Past Medical History  Diagnosis Date  . Chronic systolic heart failure   . Edema   . Atrial fibrillation   . Obesity, unspecified   . Anxiety state, unspecified   . Psychiatric disorder   . Obstructive sleep apnea   . History of medication noncompliance   . Alcohol abuse   . Shortness of breath   . Migraine   . Pulmonary embolism   . CHF (congestive heart failure)    Past Surgical History  Procedure Laterality Date  . Testicle surgery    . Cardiac catheterization     Family History   Problem Relation Age of Onset  . Hypertension      Family History  . Stroke      Family History  . Diabetes      Family History  . Cancer Mother     brain tumor  . Hypertension Mother   . Diabetes Father   . Heart disease Maternal Grandmother    History  Substance Use Topics  . Smoking status: Current Every Day Smoker -- 0.50 packs/day for 30 years    Types: Cigarettes  . Smokeless tobacco: Never Used     Comment: 10/2014- using patches  . Alcohol Use: 0.0 oz/week    0 Not specified per week     Comment: nothing to drink since last OV with Dr. Harl Bowie per patient    Review of Systems  Constitutional: Negative for fever and diaphoresis.  Respiratory: Positive for cough and shortness of breath. Negative for sputum production and wheezing.   Cardiovascular: Positive for chest pain.  Gastrointestinal: Negative for vomiting and abdominal pain.  Musculoskeletal:       New bilateral knee pain and right knee swelling  Neurological: Positive for numbness.       Tingling   All other systems reviewed and are negative.     Allergies  Ace inhibitors and Buspirone  Home Medications   Prior to Admission medications   Medication Sig Start Date End Date Taking? Authorizing Provider  clonazePAM (KLONOPIN) 1 MG tablet Take 1 tablet (1 mg  total) by mouth 3 (three) times daily as needed for anxiety. 10/18/14   Alycia Rossetti, MD  fluticasone (FLONASE) 50 MCG/ACT nasal spray Place 2 sprays into both nostrils daily. Patient taking differently: Place 2 sprays into both nostrils as needed.  10/10/14   Arnoldo Lenis, MD  metoprolol succinate (TOPROL-XL) 100 MG 24 hr tablet TAKE 1 TABLET BY MOUTH DAILY WITH OR IMMEDIATELY FOLLOWING A MEAL 11/06/14   Alycia Rossetti, MD  NON FORMULARY Bipap, pressure 16/12    Historical Provider, MD  pantoprazole (PROTONIX) 40 MG tablet Take 1 tablet (40 mg total) by mouth daily. 11/08/14   Alycia Rossetti, MD  rivaroxaban (XARELTO) 20 MG TABS tablet  Take 20 mg by mouth daily with supper.    Historical Provider, MD  spironolactone (ALDACTONE) 25 MG tablet TAKE 1 TABLET BY MOUTH DAILY 10/18/14   Alycia Rossetti, MD  torsemide (DEMADEX) 20 MG tablet Take 1 tablet (20 mg total) by mouth daily as needed. 09/04/14   Alycia Rossetti, MD   BP 106/71 mmHg  Pulse 56  Temp(Src) 98.3 F (36.8 C) (Oral)  Resp 13  Ht 5' 8.5" (1.74 m)  Wt 420 lb (190.511 kg)  BMI 62.92 kg/m2  SpO2 99% Physical Exam  Constitutional: He is oriented to person, place, and time. He appears well-developed and well-nourished. No distress.  Morbidly obese  HENT:  Head: Normocephalic and atraumatic.  Mouth/Throat: Oropharynx is clear and moist.  Eyes: Conjunctivae and EOM are normal. Pupils are equal, round, and reactive to light.  Neck: Normal range of motion. Neck supple.  Cardiovascular: Normal rate, regular rhythm and intact distal pulses.   No murmur heard. Pulmonary/Chest: Effort normal and breath sounds normal. No respiratory distress. He has no wheezes. He has no rales.  Globally decreased breath sounds which is most likely due to body habitus  Abdominal: Soft. He exhibits no distension. There is no tenderness. There is no rebound and no guarding.  Musculoskeletal: He exhibits no edema.       Right knee: He exhibits decreased range of motion and effusion. He exhibits no erythema. Tenderness found. Medial joint line and lateral joint line tenderness noted.       Left knee: He exhibits decreased range of motion. He exhibits no swelling and no effusion. Tenderness found. Medial joint line and lateral joint line tenderness noted.  Neurological: He is alert and oriented to person, place, and time.  Skin: Skin is warm and dry. No rash noted. No erythema.  Psychiatric: He has a normal mood and affect. His behavior is normal.  Nursing note and vitals reviewed.   ED Course  Procedures (including critical care time) Labs Review Labs Reviewed  BASIC METABOLIC PANEL  - Abnormal; Notable for the following:    Glucose, Bld 120 (*)    GFR calc non Af Amer 80 (*)    All other components within normal limits  CBC  BRAIN NATRIURETIC PEPTIDE  I-STAT TROPOININ, ED    Imaging Review Dg Chest 2 View  12/27/2014   CLINICAL DATA:  Chest pain, bilateral knee swelling  EXAM: CHEST  2 VIEW  COMPARISON:  05/23/2014  FINDINGS: Cardiomediastinal silhouette is stable. No acute infiltrate or pulmonary edema. Previous nodule in right midlung has resolved. Degenerative changes thoracic spine.  IMPRESSION: No active cardiopulmonary disease. Degenerative changes thoracic spine.   Electronically Signed   By: Lahoma Crocker M.D.   On: 12/27/2014 13:13   Dg Knee Complete 4 Views Left  12/27/2014   CLINICAL DATA:  Bilateral knee pain starting yesterday morning  EXAM: LEFT KNEE - COMPLETE 4+ VIEW  COMPARISON:  None.  FINDINGS: Four views of the left knee submitted. No acute fracture or subluxation. Diffuse mild narrowing of joint space. No joint effusion.  IMPRESSION: No acute fracture or subluxation. Diffuse mild narrowing of joint space. No joint effusion.   Electronically Signed   By: Lahoma Crocker M.D.   On: 12/27/2014 13:20   Dg Knee Complete 4 Views Right  12/27/2014   CLINICAL DATA:  Bilateral knee pain starting yesterday morning  EXAM: RIGHT KNEE - COMPLETE 4+ VIEW  COMPARISON:  None.  FINDINGS: Four views of the right knee submitted. No acute fracture or subluxation. There is diffuse mild narrowing of joint space. Narrowing of patellofemoral joint space. Spurring of patella.  IMPRESSION: No acute fracture or subluxation. Osteoarthritic changes as described above.   Electronically Signed   By: Lahoma Crocker M.D.   On: 12/27/2014 13:21     EKG Interpretation   Date/Time:  Wednesday December 27 2014 11:26:47 EST Ventricular Rate:  62 PR Interval:  176 QRS Duration: 118 QT Interval:  412 QTC Calculation: 418 R Axis:   99 Text Interpretation:  Normal sinus rhythm Rightward axis  Non-specific  intra-ventricular conduction delay ST \\T \ T wave abnormality, consider  inferolateral ischemia Abnormal ECG Confirmed by ZAVITZ  MD, JOSHUA ([id number])  on 12/27/2014 11:34:42 AM      MDM   Final diagnoses:  SOB (shortness of breath)  EKG abnormalities  Precordial pain  Benzodiazepine withdrawal, uncomplicated    Patient with multiple medical problems including heart failure, A. fib, morbid obesity, PE on xarelto and chronic benzo use for anxiety who presents today with multiple complaints including worsening shortness of breath, generalized pain and shaking. 12 days ago patient went off of his Klonopin cold [place] and started Neurontin per her doctors. His primary physician cautioned against this and wanted him to wean off the Klonopin but he just stopped it. This is most likely the cause of him feeling jittery and having intermittent muscle cramps and twitching.  Secondly is complaining of shortness of breath and chest pain. He does have a significant past medical history however he is also had a cardiac catheterization showing no acute lesions. Today patient does have swelling in the lower extremities as well as a right knee effusion which is new. No decreased breath sounds on exam. He's been using his BiPAP mask at home to help him catch his breath. He states he is still taking the blood thinner and has not missed any doses.  EKG is unchanged with no evidence of T-wave inversion in the inferior lateral leads. No signs of atrial fibrillation at this time. BMP within normal limits. BMP within normal limits. Initial troponin is 0.05. Patient saw cardiology within the last month and stated he was best to get a repeat echo due to known cardiomyopathy in the past but has not set echo up yet.  1:43 PM Initial labs within normal limits and x-rays without acute findings except for osteoarthritis of the knees. However with no EKG changes feel the patient most likely needs admission for rule  out and repeat echo  Blanchie Dessert, MD 12/27/14 1525

## 2014-12-27 NOTE — ED Notes (Signed)
Cardiology at bedside.

## 2015-01-04 ENCOUNTER — Ambulatory Visit (HOSPITAL_COMMUNITY)
Admission: RE | Admit: 2015-01-04 | Discharge: 2015-01-04 | Disposition: A | Discharge status: Home / Self Care | Payer: Medicare Other | Source: Ambulatory Visit | Attending: Cardiology | Admitting: Cardiology

## 2015-01-04 ENCOUNTER — Telehealth (HOSPITAL_COMMUNITY): Payer: Self-pay | Admitting: Vascular Surgery

## 2015-01-04 ENCOUNTER — Encounter (HOSPITAL_COMMUNITY): Payer: Self-pay

## 2015-01-04 VITALS — BP 138/69 | HR 87 | Resp 18 | Wt >= 6400 oz

## 2015-01-04 DIAGNOSIS — I480 Paroxysmal atrial fibrillation: Secondary | ICD-10-CM | POA: Insufficient documentation

## 2015-01-04 DIAGNOSIS — Z720 Tobacco use: Secondary | ICD-10-CM

## 2015-01-04 DIAGNOSIS — Z79899 Other long term (current) drug therapy: Secondary | ICD-10-CM | POA: Insufficient documentation

## 2015-01-04 DIAGNOSIS — I429 Cardiomyopathy, unspecified: Secondary | ICD-10-CM | POA: Diagnosis not present

## 2015-01-04 DIAGNOSIS — I5022 Chronic systolic (congestive) heart failure: Secondary | ICD-10-CM | POA: Insufficient documentation

## 2015-01-04 DIAGNOSIS — I10 Essential (primary) hypertension: Secondary | ICD-10-CM | POA: Insufficient documentation

## 2015-01-04 DIAGNOSIS — R0602 Shortness of breath: Secondary | ICD-10-CM

## 2015-01-04 DIAGNOSIS — Z7901 Long term (current) use of anticoagulants: Secondary | ICD-10-CM | POA: Diagnosis not present

## 2015-01-04 DIAGNOSIS — Z86711 Personal history of pulmonary embolism: Secondary | ICD-10-CM | POA: Diagnosis not present

## 2015-01-04 DIAGNOSIS — I2699 Other pulmonary embolism without acute cor pulmonale: Secondary | ICD-10-CM

## 2015-01-04 DIAGNOSIS — G4733 Obstructive sleep apnea (adult) (pediatric): Secondary | ICD-10-CM

## 2015-01-04 DIAGNOSIS — F410 Panic disorder [episodic paroxysmal anxiety] without agoraphobia: Secondary | ICD-10-CM | POA: Insufficient documentation

## 2015-01-04 DIAGNOSIS — F17210 Nicotine dependence, cigarettes, uncomplicated: Secondary | ICD-10-CM | POA: Insufficient documentation

## 2015-01-04 LAB — BASIC METABOLIC PANEL
Anion gap: 7 (ref 5–15)
BUN: 13 mg/dL (ref 6–23)
CO2: 25 mmol/L (ref 19–32)
Calcium: 9 mg/dL (ref 8.4–10.5)
Chloride: 106 mmol/L (ref 96–112)
Creatinine, Ser: 1.14 mg/dL (ref 0.50–1.35)
GFR, EST AFRICAN AMERICAN: 88 mL/min — AB (ref >90)
GFR, EST NON AFRICAN AMERICAN: 76 mL/min — AB (ref >90)
Glucose, Bld: 135 mg/dL — ABNORMAL HIGH (ref 70–99)
Potassium: 3.7 mmol/L (ref 3.5–5.1)
SODIUM: 138 mmol/L (ref 135–145)

## 2015-01-04 LAB — BRAIN NATRIURETIC PEPTIDE: B Natriuretic Peptide: 9.7 pg/mL (ref 0.0–100.0)

## 2015-01-04 MED ORDER — LOSARTAN POTASSIUM 25 MG PO TABS: 25.0000 mg | ORAL_TABLET | Freq: Every day | ORAL | Status: DC

## 2015-01-04 NOTE — Telephone Encounter (Signed)
Pt aware referral made, they will call him to schedule

## 2015-01-04 NOTE — Progress Notes (Signed)
Patient ID: [name], male   DOB: March 25, 1966, 49 y.o.   MRN: [id number] PCP: Dr. Buelah Manis  49 yo with history of nonischemic cardiomyopathy presents for cardiology followup.  I saw him in the past when I used to go to [place] but transferred his care to Dr. Harl Bowie when I no longer had office time there.  Recently, he has moved to [place] and wants to followup in [place].  His last echo showed EF 30-35% in 5/15.  The cardiomyopathy may be due to prior ETOH abuse, he now has quit.  Past cath showed no significant coronary disease.  He is morbidly obese.  He smokes 1/2 ppd.  He is on Xarelto for a PE in 5/15.   His dyspnea waxes and wanes.  He is short of breath walking up a flight of steps.  Usually he is able to walk on flat ground without dyspnea but sometimes is short of breath walking longer distances. He also has spells of rest dyspnea that seem to be associated with anxiety.  No chest pain.  No PND.  No tachypalpitations.   ECG: NSR, IVCD (118 msec), inferior and anterolateral T wave inversions, right axis deviation.   Labs (5/13): K 4.1, creatinine 1.05 Labs (1/14): K 3.8, creatinine 1.16, BNP 54 Labs (2/14): K 3.7, creatinine 1.11, BNP 28  PMH: 1. Nonischemic cardiomyopathy: Prior cath with no significant CAD.  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  Prior echoes with EF as low as 25%.  Echo (9/13) with EF 35-40%, moderate to severe LV dilation, diffuse hypokinesis, mild MR. Echo (5/15) with EF 30-35%, moderate to severe LAE, normal RV size and systolic function.  Angioedema with ACEI, headaches with hydralazine/nitrates.  2. HTN: angioedema with ACEI.  3. OSA: on Bipap 4. Morbid obesity 5. Paroxysmal atrial fibrillation: Not documented recently.  He is not on anticoagulation.  6. Smoker.  7. Anxiety/panic attacks 8. PE: 5/15, diagnosed by V/Q scan.  9. NSVT  SH: On disability, lives in [place], prior ETOH abuse but now dose not drink, no  drugs, smokes 1/2 ppd.  Has son and daughter.    FH: No premature CAD.    ROS: All systems reviewed and negative except as per HPI.   BP 138/69 mmHg  Pulse 87  Resp 18  Wt 431 lb 4 oz (195.614 kg)  SpO2 97% General: NAD, obese.  Neck: Thick, no JVD, no thyromegaly or thyroid nodule.  Lungs: Clear to auscultation bilaterally with normal respiratory effort. CV: Nondisplaced PMI.  Heart regular S1/S2, no S3/S4, no murmur.  Trace ankle edema.  No carotid bruit.  Normal pedal pulses.  Abdomen: Soft, nontender, unable to palpate liver edge, mild distention.  Neurologic: Alert and oriented x 3.  Psych: Normal affect. Extremities: No clubbing or cyanosis.   Assessment/Plan: 1. Chronic systolic CHF: He does not appear volume overloaded.  NYHA class II-III symptoms.  I suspect that weight plays a role in his exercise intolerance.  - Continue current Toprol XL and spironolactone.  - Increase losartan to 25 mg daily.  He had angioedema with ACEI, but the risk of angioedema in this situation with ARB use is low.   - Continue current torsemide (20 mg daily) and KCl.  BMET/BNP today.  - Needs repeat echo => ICD if EF < 35%.   - Continue to avoid ETOH.  2. Obesity: Continue efforts at weight loss.   3. Smoking: Still smoking.  I strongly encouraged him  to quit.  4. Anxiety/Panic: This is still an issue for him.  5. Paroxysmal atrial fibrillation: No recent atrial fibrillation noted.  He is on Xarelto for PE. 6. PE: 5/15, diagnosed by V/Q scan.  Now on Xarelto.  Would continue Xarelto long-term.  He is sedentary and obese, think that his risk for recurrent VTE would be significant.  [name] 01/04/2015

## 2015-01-04 NOTE — Patient Instructions (Signed)
INCREASE Losartan to 25mg  (1 tablet) once daily. 90 day supply sent to [facility] with refills.  Follow up 1 month with echocardiogram.  Do the following things EVERYDAY: 1) Weigh yourself in the morning before breakfast. Write it down and keep it in a log. 2) Take your medicines as prescribed 3) Eat low salt foods-Limit salt (sodium) to 2000 mg per day.  4) Stay as active as you can everyday 5) Limit all fluids for the day to less than 2 liters

## 2015-01-04 NOTE — Telephone Encounter (Signed)
Pt  Called he had an appt with [facility] today he want to let [facility] know that he does want to do Cardiac rehab

## 2015-01-22 ENCOUNTER — Encounter: Payer: Self-pay | Admitting: Family Medicine

## 2015-01-22 ENCOUNTER — Ambulatory Visit (INDEPENDENT_AMBULATORY_CARE_PROVIDER_SITE_OTHER): Payer: Medicare Other | Admitting: Family Medicine

## 2015-01-22 VITALS — BP 138/78 | HR 72 | Temp 98.1°F | Resp 16 | Ht 69.0 in | Wt >= 6400 oz

## 2015-01-22 DIAGNOSIS — F331 Major depressive disorder, recurrent, moderate: Secondary | ICD-10-CM

## 2015-01-22 DIAGNOSIS — F411 Generalized anxiety disorder: Secondary | ICD-10-CM | POA: Diagnosis not present

## 2015-01-22 DIAGNOSIS — E785 Hyperlipidemia, unspecified: Secondary | ICD-10-CM

## 2015-01-22 DIAGNOSIS — E119 Type 2 diabetes mellitus without complications: Secondary | ICD-10-CM

## 2015-01-22 DIAGNOSIS — I10 Essential (primary) hypertension: Secondary | ICD-10-CM | POA: Diagnosis not present

## 2015-01-22 DIAGNOSIS — J029 Acute pharyngitis, unspecified: Secondary | ICD-10-CM

## 2015-01-22 LAB — COMPREHENSIVE METABOLIC PANEL
ALK PHOS: 79 U/L (ref 39–117)
ALT: 18 U/L (ref 0–53)
AST: 34 U/L (ref 0–37)
Albumin: 3.6 g/dL (ref 3.5–5.2)
BILIRUBIN TOTAL: 0.7 mg/dL (ref 0.2–1.2)
BUN: 17 mg/dL (ref 6–23)
CHLORIDE: 107 meq/L (ref 96–112)
CO2: 21 mEq/L (ref 19–32)
CREATININE: 1.03 mg/dL (ref 0.50–1.35)
Calcium: 9.1 mg/dL (ref 8.4–10.5)
GLUCOSE: 125 mg/dL — AB (ref 70–99)
Potassium: 4.1 mEq/L (ref 3.5–5.3)
SODIUM: 138 meq/L (ref 135–145)
TOTAL PROTEIN: 7 g/dL (ref 6.0–8.3)

## 2015-01-22 LAB — CBC WITH DIFFERENTIAL/PLATELET
BASOS ABS: 0 10*3/uL (ref 0.0–0.1)
Basophils Relative: 0 % (ref 0–1)
EOS ABS: 0.3 10*3/uL (ref 0.0–0.7)
Eosinophils Relative: 3 % (ref 0–5)
HCT: 40 % (ref 39.0–52.0)
Hemoglobin: 14.1 g/dL (ref 13.0–17.0)
LYMPHS PCT: 45 % (ref 12–46)
Lymphs Abs: 3.8 10*3/uL (ref 0.7–4.0)
MCH: 29 pg (ref 26.0–34.0)
MCHC: 35.3 g/dL (ref 30.0–36.0)
MCV: 82.1 fL (ref 78.0–100.0)
MONOS PCT: 9 % (ref 3–12)
MPV: 10.7 fL (ref 8.6–12.4)
Monocytes Absolute: 0.8 10*3/uL (ref 0.1–1.0)
NEUTROS ABS: 3.6 10*3/uL (ref 1.7–7.7)
NEUTROS PCT: 43 % (ref 43–77)
Platelets: 254 10*3/uL (ref 150–400)
RBC: 4.87 MIL/uL (ref 4.22–5.81)
RDW: 14.1 % (ref 11.5–15.5)
WBC: 8.4 10*3/uL (ref 4.0–10.5)

## 2015-01-22 LAB — RAPID STREP SCREEN (MED CTR MEBANE ONLY): Streptococcus, Group A Screen (Direct): NEGATIVE

## 2015-01-22 LAB — LIPID PANEL
Cholesterol: 166 mg/dL (ref 0–200)
HDL: 28 mg/dL — ABNORMAL LOW (ref >=40)
LDL Cholesterol: 96 mg/dL (ref 0–99)
TRIGLYCERIDES: 208 mg/dL — AB (ref <150)
Total CHOL/HDL Ratio: 5.9 Ratio
VLDL: 42 mg/dL — ABNORMAL HIGH (ref 0–40)

## 2015-01-22 LAB — HEMOGLOBIN A1C
Hgb A1c MFr Bld: 6.5 % — ABNORMAL HIGH (ref <5.7)
Mean Plasma Glucose: 140 mg/dL — ABNORMAL HIGH (ref <117)

## 2015-01-22 MED ORDER — CARBAMIDE PEROXIDE 6.5 % OT SOLN
5.0000 [drp] | Freq: Two times a day (BID) | OTIC | Status: DC
Start: 2015-01-22 — End: 2015-03-26

## 2015-01-22 NOTE — Patient Instructions (Signed)
Continue current medications We will call with lab results  F/U 4 months  

## 2015-01-22 NOTE — Assessment & Plan Note (Signed)
Blood pressure is well-controlled and change in medication

## 2015-01-22 NOTE — Progress Notes (Signed)
Patient ID: [name], male   DOB: May 08, 1966, 49 y.o.   MRN: [id number]   Subjective:    Patient ID: [name], male    DOB: June 04, 1966, 50 y.o.   MRN: [id number]  Patient presents for 3 month F/U  patient here to follow-up medications. He has history of diabetes mellitus which is diet-controlled. He states that he has been trying to work out but he is very sore. He has not had any shortness of breath or chest pain while working out.  Severe anxiety panic attacks and depression. He was seen by one psychiatrist who thought because of his history and the complexity of his medical history and past that he would be better served at [facility]. He was seen by Beverly Sessions they wanted to get him off his Klonopin therefore they put him on gabapentin and took him off of Klonopin within a couple of days he had some rebound effects from this which cause some chest pain and increased anxiety therefore he did go to the emergency room at that time they put him back on Ativan. Ativan did not help therefore he refilled his last Klonopin prescription. He states that the gabapentin gave him weird hallucinations therefore he discontinued it. He has follow-up with his new psychiatrist at [facility] next week      Review Of Systems: per above  GEN- denies fatigue, fever, weight loss,weakness, recent illness HEENT- denies eye drainage, change in vision, nasal discharge, CVS- denies chest pain, palpitations RESP- denies SOB, cough, wheeze ABD- denies N/V, change in stools, abd pain GU- denies dysuria, hematuria, dribbling, incontinence MSK- + joint pain, muscle aches, injury Neuro- denies headache, dizziness, syncope, seizure activity       Objective:    BP 138/78 mmHg  Pulse 72  Temp(Src) 98.1 F (36.7 C) (Oral)  Resp 16  Ht 5\' 9"  (1.753 m)  Wt 421 lb (190.964 kg)  BMI 62.14 kg/m2 GEN- NAD, alert and oriented x3, obese HEENT- PERRL, EOMI, non injected sclera, pink conjunctiva, MMM, oropharynx mild  injection, TM obscured by wax bilaterally, nares clear, no maxillary sinus tenderness CVS- RRR, no murmur RESP-CTAB Psych- anxious mood, not depressed, good eye contact EXT- trace pedal edema Pulses- Radial, DP- 2+        Assessment & Plan:      Problem List Items Addressed This Visit      Unprioritized   Morbid obesity   Major depression (Chronic)    He has uncontrolled anxiety depression mood disorder. He is very reluctant to take any medications we have been do quite a few medications in the past. He is a very difficult and complex patient. He will just follow-up with his psychiatrist next week at this time he will continue on the Klonopin which she takes a half a tablet 2-3 times a day      Hyperlipidemia    Check lipid panel today      Relevant Orders   Lipid panel   Essential hypertension    Blood pressure is well-controlled and change in medication      Relevant Orders   CBC with Differential/Platelet   Comprehensive metabolic panel   Diabetes mellitus    Diet-controlled Will recheck his A1c      Relevant Orders   Hemoglobin A1c   Anxiety state - Primary    Other Visit Diagnoses    Sore throat        At end of visit he said that he had a sore throat  and he had some mild drainage strep was negative he has some sinus medicine but will not take it    Relevant Orders    Rapid Strep Screen (Completed)      cerumen impaction he was given Debox drops    Note: This dictation was prepared with Dragon dictation along with smaller phrase technology. Any transcriptional errors that result from this process are unintentional.

## 2015-01-22 NOTE — Assessment & Plan Note (Signed)
Check lipid panel today 

## 2015-01-22 NOTE — Assessment & Plan Note (Signed)
Diet-controlled Will recheck his A1c

## 2015-01-22 NOTE — Assessment & Plan Note (Signed)
He has uncontrolled anxiety depression mood disorder. He is very reluctant to take any medications we have been do quite a few medications in the past. He is a very difficult and complex patient. He will just follow-up with his psychiatrist next week at this time he will continue on the Klonopin which she takes a half a tablet 2-3 times a day

## 2015-01-30 DIAGNOSIS — F250 Schizoaffective disorder, bipolar type: Secondary | ICD-10-CM | POA: Diagnosis not present

## 2015-01-31 ENCOUNTER — Telehealth: Payer: Self-pay | Admitting: Cardiology

## 2015-01-31 NOTE — Telephone Encounter (Signed)
Numerous attempts to contact patient with recall letters with no success. Called [name] home # with no answer. User: Time: StatusChanda [name] [[id number]] 06/13/2014 10:24 AM New [10]   [name] [[id number]] 06/13/2014 10:25 AM Notification Sent [20]   [name] [[id number]] 08/03/2014 3:10 PM Notification Sent [20]   [name] [[id number]] 09/27/2014 2:28 PM Notification Sent [20]

## 2015-02-01 ENCOUNTER — Ambulatory Visit (HOSPITAL_BASED_OUTPATIENT_CLINIC_OR_DEPARTMENT_OTHER)
Admission: RE | Admit: 2015-02-01 | Discharge: 2015-02-01 | Disposition: A | Discharge status: Home / Self Care | Payer: Medicare Other | Source: Ambulatory Visit | Attending: Cardiology | Admitting: Cardiology

## 2015-02-01 ENCOUNTER — Encounter (HOSPITAL_COMMUNITY): Payer: Self-pay

## 2015-02-01 ENCOUNTER — Ambulatory Visit (HOSPITAL_COMMUNITY)
Admission: RE | Admit: 2015-02-01 | Discharge: 2015-02-01 | Disposition: A | Discharge status: Home / Self Care | Payer: Medicare Other | Source: Ambulatory Visit | Attending: Cardiology | Admitting: Cardiology

## 2015-02-01 VITALS — BP 112/68 | HR 92 | Resp 18 | Wt >= 6400 oz

## 2015-02-01 DIAGNOSIS — I5022 Chronic systolic (congestive) heart failure: Secondary | ICD-10-CM

## 2015-02-01 DIAGNOSIS — I480 Paroxysmal atrial fibrillation: Secondary | ICD-10-CM

## 2015-02-01 DIAGNOSIS — R0600 Dyspnea, unspecified: Secondary | ICD-10-CM

## 2015-02-01 LAB — BASIC METABOLIC PANEL
Anion gap: 7 (ref 5–15)
BUN: 12 mg/dL (ref 6–23)
CO2: 24 mmol/L (ref 19–32)
CREATININE: 1.15 mg/dL (ref 0.50–1.35)
Calcium: 8.8 mg/dL (ref 8.4–10.5)
Chloride: 109 mmol/L (ref 96–112)
GFR calc Af Amer: 87 mL/min — ABNORMAL LOW (ref >90)
GFR calc non Af Amer: 75 mL/min — ABNORMAL LOW (ref >90)
Glucose, Bld: 114 mg/dL — ABNORMAL HIGH (ref 70–99)
Potassium: 4 mmol/L (ref 3.5–5.1)
Sodium: 140 mmol/L (ref 135–145)

## 2015-02-01 LAB — BRAIN NATRIURETIC PEPTIDE: B Natriuretic Peptide: 79.2 pg/mL (ref 0.0–100.0)

## 2015-02-01 MED ORDER — LOSARTAN POTASSIUM 50 MG PO TABS: 50.0000 mg | ORAL_TABLET | Freq: Every day | ORAL | Status: DC

## 2015-02-01 NOTE — Progress Notes (Signed)
  Echocardiogram 2D Echocardiogram has been performed.  Darlina Sicilian M 02/01/2015, 1:31 PM

## 2015-02-01 NOTE — Patient Instructions (Signed)
INCREASE Losartan to 50mg  once daily.  We will refer you to Cardiac Rehab.  Return in 2 weeks for lab work.  Follow up 6 weeks for routine appointment.  Do the following things EVERYDAY: 1) Weigh yourself in the morning before breakfast. Write it down and keep it in a log. 2) Take your medicines as prescribed 3) Eat low salt foods-Limit salt (sodium) to 2000 mg per day.  4) Stay as active as you can everyday 5) Limit all fluids for the day to less than 2 liters

## 2015-02-02 NOTE — Progress Notes (Signed)
Patient ID: [name], male   DOB: 1966/11/03, 49 y.o.   MRN: [id number] PCP: Dr. Buelah Manis  49 yo with history of nonischemic cardiomyopathy presents for cardiology followup.  Echo in 5/15 showed EF 30-35%.  The cardiomyopathy may be due to prior ETOH abuse, he now has quit.  Past cath showed no significant coronary disease.  He is morbidly obese.  He smokes 1/2 ppd.  He is on Xarelto for a PE in 5/15.   His dyspnea waxes and wanes.  He is short of breath walking up a flight of steps.  Usually he is able to walk on flat ground without dyspnea but sometimes is short of breath walking longer distances. He also has spells of rest dyspnea that seem to be associated with anxiety.  No chest pain.  No PND.  Occasional palpitations lasting < 30 seconds.  He has been going to the gym and walks up to 2 miles at a time.  He is using torsemide only prn.  Echo was done today, showing EF 25-30% with severe LV dilation.  ECG: NSR, IVCD (118 msec), inferior and anterolateral T wave inversions, right axis deviation.   Labs (5/13): K 4.1, creatinine 1.05 Labs (1/14): K 3.8, creatinine 1.16, BNP 54 Labs (2/14): K 3.7, creatinine 1.11, BNP 28 Labs (2/16): K 4.1, creatinine 1.03, LDL 96, HCT 40  PMH: 1. Nonischemic cardiomyopathy: Prior cath with no significant CAD.  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  Prior echoes with EF as low as 25%.  Echo (9/13) with EF 35-40%, moderate to severe LV dilation, diffuse hypokinesis, mild MR. Echo (5/15) with EF 30-35%, moderate to severe LAE, normal RV size and systolic function.  Angioedema with ACEI, headaches with hydralazine/nitrates. Echo (3/16) with EF 25-30%, severe LV dilation, normal RV size and systolic function.  2. HTN: angioedema with ACEI.  3. OSA: on Bipap 4. Morbid obesity 5. Paroxysmal atrial fibrillation: Not documented recently.  He is not on anticoagulation.  6. Smoker.  7. Anxiety/panic attacks 8. PE: 5/15,  diagnosed by V/Q scan.  9. NSVT  SH: On disability, lives in [place], prior ETOH abuse but now dose not drink, no drugs, smokes 1/2 ppd.  Has son and daughter.    FH: No premature CAD.    ROS: All systems reviewed and negative except as per HPI.   BP 112/68 mmHg  Pulse 92  Resp 18  Wt 435 lb (197.315 kg)  SpO2 98% General: NAD, obese.  Neck: Thick, no JVD, no thyromegaly or thyroid nodule.  Lungs: Clear to auscultation bilaterally with normal respiratory effort. CV: Nondisplaced PMI.  Heart regular S1/S2, no S3/S4, no murmur.  Trace ankle edema.  No carotid bruit.  Normal pedal pulses.  Abdomen: Soft, nontender, unable to palpate liver edge, mild distention.  Neurologic: Alert and oriented x 3.  Psych: Normal affect. Extremities: No clubbing or cyanosis.   Assessment/Plan: 1. Chronic systolic CHF: He does not appear volume overloaded.  NYHA class II symptoms.  Echo (3/16) with EF 25-30%, severely dilated LV.   - We discussed ICD implantation.  He would not be CRT candidate.  He is not ready for ICD yet, wants to work more on medical management and repeat echo in 6 months.  If EF remains low, he is willing to get ICD.  - Continue current Toprol XL and spironolactone.  - Increase losartan to 50 mg daily.  He had angioedema with ACEI, but the risk of  angioedema in this situation with ARB use is low.   - He can continue to use torsemide prn.   - BMET/BNP in 2 wks.  - Continue to avoid ETOH.  2. Obesity: Continue efforts at weight loss.  I will refer him to cardiac rehab to get more exercise in a controlled setting.  3. Smoking: Still smoking.  I strongly encouraged him to quit. He will consider trying nicotine patches.  4. Anxiety/Panic: This is still an issue for him.  5. Paroxysmal atrial fibrillation: No recent atrial fibrillation noted.  He is on Xarelto for PE. 6. PE: 5/15, diagnosed by V/Q scan.  Now on Xarelto.  Would continue Xarelto long-term.  He is sedentary and obese, think  that his risk for recurrent VTE would be significant.  [name] 02/02/2015

## 2015-02-06 MED FILL — Perflutren Lipid Microsphere IV Susp 1.1 MG/ML: INTRAVENOUS | Qty: 10 | Status: AC

## 2015-02-08 ENCOUNTER — Telehealth (HOSPITAL_COMMUNITY): Payer: Self-pay | Admitting: *Deleted

## 2015-02-14 ENCOUNTER — Other Ambulatory Visit (HOSPITAL_COMMUNITY): Payer: Medicare Other

## 2015-02-22 ENCOUNTER — Encounter (HOSPITAL_COMMUNITY)
Admission: RE | Admit: 2015-02-22 | Discharge: 2015-02-22 | Disposition: A | Discharge status: Home / Self Care | Payer: Medicare Other | Source: Ambulatory Visit | Attending: Cardiology | Admitting: Cardiology

## 2015-02-22 ENCOUNTER — Ambulatory Visit (HOSPITAL_COMMUNITY)
Admission: RE | Admit: 2015-02-22 | Discharge: 2015-02-22 | Disposition: A | Discharge status: Home / Self Care | Payer: Medicare Other | Source: Ambulatory Visit | Attending: Internal Medicine | Admitting: Internal Medicine

## 2015-02-22 DIAGNOSIS — I5022 Chronic systolic (congestive) heart failure: Secondary | ICD-10-CM | POA: Insufficient documentation

## 2015-02-22 DIAGNOSIS — F17210 Nicotine dependence, cigarettes, uncomplicated: Secondary | ICD-10-CM | POA: Insufficient documentation

## 2015-02-22 DIAGNOSIS — Z79899 Other long term (current) drug therapy: Secondary | ICD-10-CM | POA: Insufficient documentation

## 2015-02-22 LAB — BASIC METABOLIC PANEL
Anion gap: 7 (ref 5–15)
BUN: 16 mg/dL (ref 6–23)
CHLORIDE: 106 mmol/L (ref 96–112)
CO2: 27 mmol/L (ref 19–32)
Calcium: 9.4 mg/dL (ref 8.4–10.5)
Creatinine, Ser: 1.13 mg/dL (ref 0.50–1.35)
GFR calc non Af Amer: 76 mL/min — ABNORMAL LOW (ref >90)
GFR, EST AFRICAN AMERICAN: 88 mL/min — AB (ref >90)
Glucose, Bld: 125 mg/dL — ABNORMAL HIGH (ref 70–99)
Potassium: 3.7 mmol/L (ref 3.5–5.1)
SODIUM: 140 mmol/L (ref 135–145)

## 2015-02-22 LAB — BRAIN NATRIURETIC PEPTIDE: B NATRIURETIC PEPTIDE 5: 36.7 pg/mL (ref 0.0–100.0)

## 2015-02-22 NOTE — Progress Notes (Signed)
Pt presents to office for labs\pt wanted to inform provider he has NOT increased losartan to 50 mg daily as prescribed at last office visit Pt states losartan continues to give him HA Pt states he will increase to 37.5 mg daily starting Monday 02/26/15  Weight down x 20 lbs, with some increased fatigue Advised pt to continue current medication regimen and will note provider with lab orders for further instructions if any

## 2015-02-22 NOTE — Progress Notes (Signed)
Cardiac Rehab Medication Review by a Pharmacist  Does the patient  feel that his/her medications are working for him/her?  yes  Has the patient been experiencing any side effects to the medications prescribed?  yes  Does the patient measure his/her own blood pressure or blood glucose at home?  yes   Does the patient have any problems obtaining medications due to transportation or finances?   no  Understanding of regimen: fair Understanding of indications: fair Potential of compliance: poor    Pharmacist comments: 61 YOM presenting to cardiac rehab.  He complains of migraines due to losartan.  He reports episodes of low blood pressure.  He is compliant with his metoprolol he states.  He thinks his potassium is "out of wack".  Of note he is on spironolactone and demedex.  He has cramps after taking his demedex is not compliant with this regimen.  He reports fluid building in his lower extremities however.  He is also not compliant with his Xarelto, due to seeing all the TV ads.  He discussed the importance of anticoagulation.  Measures his BP at home is very anxious about how low his BP gets.  Hassie Bruce, Pharm. D. Clinical Pharmacy Resident Pager: [phone] Ph: [phone] 02/22/2015 9:01 AM

## 2015-02-26 ENCOUNTER — Encounter (HOSPITAL_COMMUNITY): Payer: Medicare Other

## 2015-02-26 ENCOUNTER — Encounter (HOSPITAL_COMMUNITY): Payer: Self-pay

## 2015-02-26 ENCOUNTER — Encounter (HOSPITAL_COMMUNITY)
Admission: RE | Admit: 2015-02-26 | Discharge: 2015-02-26 | Disposition: A | Discharge status: Home / Self Care | Payer: Medicare Other | Source: Ambulatory Visit | Attending: Cardiology | Admitting: Cardiology

## 2015-02-26 DIAGNOSIS — I5022 Chronic systolic (congestive) heart failure: Secondary | ICD-10-CM | POA: Diagnosis not present

## 2015-02-26 DIAGNOSIS — Z79899 Other long term (current) drug therapy: Secondary | ICD-10-CM | POA: Diagnosis not present

## 2015-02-26 DIAGNOSIS — F17210 Nicotine dependence, cigarettes, uncomplicated: Secondary | ICD-10-CM | POA: Diagnosis not present

## 2015-02-26 LAB — GLUCOSE, CAPILLARY
Glucose-Capillary: 208 mg/dL — ABNORMAL HIGH (ref 70–99)
Glucose-Capillary: 127 mg/dL — ABNORMAL HIGH (ref 70–99)

## 2015-02-26 NOTE — Progress Notes (Addendum)
Pt started cardiac rehab today.  Pt tolerated light exercise without difficulty.  VSS, telemetry-NSR with occasional PVC and PAC.  Asymptomatic.  PHQ-11.  Pt is receiving treatment from [facility] with recent medication adjustments.  Pt reports a long time history of depression.  Pt feels satisfied with his current regimen.  Pt reports he has felt more depressed over past few months in which he reports he withdraws by "sheltering" himself at home.  Pt does feel comfortable with social interaction and admits group exercise setting is uncomfortable for him.  However pt had appropriate interaction with staff, volunteers and class members.  Pt is currently smoking 1ppd.  Pt recently quit for 30 days however suffered from anger management issues which he felt would be best controlled by resuming smoking.  Pt quit cold [place] and thought perhaps this method is what triggered his anxiety and anger issues.   Pt would like to quit and is interested in quit smart classes. Handout will be given to pt.  Pt also instructed to call [phone].  Pt immediate goal is to not smoke cigarettes prior to cardiac class and gradually cut back number of cigarettes smoked. Tobacco cessation counseling given. Pt also admits to occasional ETOH use.   Pt offered emotional support and reassurance.  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  Pt has recently moved to this area and closing on his house has been stressful. In addition, pt has 19 yo child at home.  Pt goals for cardiac rehab are to begin weight loss efforts, resume exercise routine, improved blood glucose levels and strengthen heart.  Pt oriented to exercise equipment and routine.  Understanding verbalized.

## 2015-02-28 ENCOUNTER — Encounter (HOSPITAL_COMMUNITY): Payer: Medicare Other

## 2015-02-28 ENCOUNTER — Encounter (HOSPITAL_COMMUNITY)
Admission: RE | Admit: 2015-02-28 | Discharge: 2015-02-28 | Disposition: A | Discharge status: Home / Self Care | Payer: Medicare Other | Source: Ambulatory Visit | Attending: Cardiology | Admitting: Cardiology

## 2015-02-28 DIAGNOSIS — F17210 Nicotine dependence, cigarettes, uncomplicated: Secondary | ICD-10-CM | POA: Diagnosis not present

## 2015-02-28 DIAGNOSIS — Z79899 Other long term (current) drug therapy: Secondary | ICD-10-CM | POA: Diagnosis not present

## 2015-02-28 DIAGNOSIS — I5022 Chronic systolic (congestive) heart failure: Secondary | ICD-10-CM | POA: Diagnosis not present

## 2015-02-28 LAB — GLUCOSE, CAPILLARY
Glucose-Capillary: 143 mg/dL — ABNORMAL HIGH (ref 70–99)
Glucose-Capillary: 168 mg/dL — ABNORMAL HIGH (ref 70–99)

## 2015-02-28 NOTE — Progress Notes (Signed)
PSYCHOSOCIAL ASSESSMENT  Pt psychosocial assessment reveals some barriers to rehab participation, with the greatest barrier being ineffective stress management techniques.  Pt is currently undergoing many areas of situational stress.  Pt is closing on new home.  Pt is single parent of 47 yo child which is stressful for him.  Pt depression sometimes makes it difficult to be in large group settings.  In addition, pt is making efforts to stop smoking and has not smoked any cigarettes today.  Pt does c/o mild nausea and anxiety however able to exercise without difficulty.    Despite the barriers pt does seem committed to working towards making necessary lifestyle modifications to improve his health.  Pt is concerned about his blood sugar readings and is willing to discuss treatment options with his PCP as necessary. Pt given information about upcoming  Diabetes and Obesity  Community education program. Pt also given dates of nutritional and risk factor reduction education classes offered by cardiac rehab. Pt offered emotional support and reassurance.  Pt praised for smoking cessation success today and encouraged to continue.   Interventions:   Diabetes Blitz and Diabetes Q&A  education class participation  Nutrition class participation Smoking cessation class participation Stress management class participation  Goals: Improved stress management  Help pt work toward returning to meaningful activities that improve patient's QOL that are attainable with his heart disease and obesity.

## 2015-03-02 ENCOUNTER — Encounter (HOSPITAL_COMMUNITY): Payer: Medicare Other

## 2015-03-05 ENCOUNTER — Encounter (HOSPITAL_COMMUNITY): Payer: Medicare Other

## 2015-03-07 ENCOUNTER — Encounter (HOSPITAL_COMMUNITY)
Admission: RE | Admit: 2015-03-07 | Discharge: 2015-03-07 | Disposition: A | Discharge status: Home / Self Care | Payer: Medicare Other | Source: Ambulatory Visit | Attending: Internal Medicine | Admitting: Internal Medicine

## 2015-03-07 ENCOUNTER — Encounter (HOSPITAL_COMMUNITY): Payer: Medicare Other

## 2015-03-07 DIAGNOSIS — Z79899 Other long term (current) drug therapy: Secondary | ICD-10-CM | POA: Diagnosis not present

## 2015-03-07 DIAGNOSIS — F17210 Nicotine dependence, cigarettes, uncomplicated: Secondary | ICD-10-CM | POA: Insufficient documentation

## 2015-03-07 DIAGNOSIS — I5022 Chronic systolic (congestive) heart failure: Secondary | ICD-10-CM | POA: Insufficient documentation

## 2015-03-07 LAB — GLUCOSE, CAPILLARY
Glucose-Capillary: 139 mg/dL — ABNORMAL HIGH (ref 70–99)
Glucose-Capillary: 215 mg/dL — ABNORMAL HIGH (ref 70–99)

## 2015-03-07 NOTE — Progress Notes (Signed)
Reviewed home exercise with pt today.  Pt plans to continue going to [employer] for exercise.  Talked about using treadmill, elliptical, and rower (when ready) for equipment.  Reviewed THR, pulse, RPE, sign and symptoms, and when to call 911 or MD.  Also discussed body weight exercises for resistance training to help with the weight loss. Pt voiced understanding. Alberteen Sam, MA, ACSM RCEP

## 2015-03-09 ENCOUNTER — Encounter (HOSPITAL_COMMUNITY)
Admission: RE | Admit: 2015-03-09 | Discharge: 2015-03-09 | Disposition: A | Discharge status: Home / Self Care | Payer: Medicare Other | Source: Ambulatory Visit | Attending: Cardiology | Admitting: Cardiology

## 2015-03-09 ENCOUNTER — Encounter (HOSPITAL_COMMUNITY): Payer: Medicare Other

## 2015-03-09 ENCOUNTER — Other Ambulatory Visit: Payer: Self-pay | Admitting: Family Medicine

## 2015-03-09 DIAGNOSIS — F17210 Nicotine dependence, cigarettes, uncomplicated: Secondary | ICD-10-CM | POA: Diagnosis not present

## 2015-03-09 DIAGNOSIS — I5022 Chronic systolic (congestive) heart failure: Secondary | ICD-10-CM | POA: Diagnosis not present

## 2015-03-09 DIAGNOSIS — Z79899 Other long term (current) drug therapy: Secondary | ICD-10-CM | POA: Diagnosis not present

## 2015-03-09 LAB — GLUCOSE, CAPILLARY
GLUCOSE-CAPILLARY: 143 mg/dL — AB (ref 70–99)
Glucose-Capillary: 119 mg/dL — ABNORMAL HIGH (ref 70–99)

## 2015-03-09 NOTE — Telephone Encounter (Signed)
Medication refilled per protocol. 

## 2015-03-12 ENCOUNTER — Encounter (HOSPITAL_COMMUNITY): Payer: Medicare Other

## 2015-03-12 ENCOUNTER — Encounter (HOSPITAL_COMMUNITY)
Admission: RE | Admit: 2015-03-12 | Discharge: 2015-03-12 | Disposition: A | Discharge status: Home / Self Care | Payer: Medicare Other | Source: Ambulatory Visit | Attending: Cardiology | Admitting: Cardiology

## 2015-03-12 DIAGNOSIS — I5022 Chronic systolic (congestive) heart failure: Secondary | ICD-10-CM | POA: Diagnosis not present

## 2015-03-12 DIAGNOSIS — Z79899 Other long term (current) drug therapy: Secondary | ICD-10-CM | POA: Diagnosis not present

## 2015-03-12 DIAGNOSIS — F17210 Nicotine dependence, cigarettes, uncomplicated: Secondary | ICD-10-CM | POA: Diagnosis not present

## 2015-03-12 LAB — GLUCOSE, CAPILLARY
Glucose-Capillary: 226 mg/dL — ABNORMAL HIGH (ref 70–99)
Glucose-Capillary: 88 mg/dL (ref 70–99)

## 2015-03-14 ENCOUNTER — Encounter (HOSPITAL_COMMUNITY): Payer: Medicare Other

## 2015-03-14 ENCOUNTER — Encounter (HOSPITAL_COMMUNITY)
Admission: RE | Admit: 2015-03-14 | Discharge: 2015-03-14 | Disposition: A | Discharge status: Home / Self Care | Payer: Medicare Other | Source: Ambulatory Visit | Attending: Cardiology | Admitting: Cardiology

## 2015-03-14 DIAGNOSIS — F17210 Nicotine dependence, cigarettes, uncomplicated: Secondary | ICD-10-CM | POA: Diagnosis not present

## 2015-03-14 DIAGNOSIS — Z79899 Other long term (current) drug therapy: Secondary | ICD-10-CM | POA: Diagnosis not present

## 2015-03-14 DIAGNOSIS — I5022 Chronic systolic (congestive) heart failure: Secondary | ICD-10-CM | POA: Diagnosis not present

## 2015-03-14 LAB — GLUCOSE, CAPILLARY
GLUCOSE-CAPILLARY: 165 mg/dL — AB (ref 70–99)
Glucose-Capillary: 85 mg/dL (ref 70–99)

## 2015-03-15 ENCOUNTER — Other Ambulatory Visit: Payer: Self-pay | Admitting: Family Medicine

## 2015-03-15 NOTE — Telephone Encounter (Signed)
Refill appropriate and filled per protocol. 

## 2015-03-16 ENCOUNTER — Encounter (HOSPITAL_COMMUNITY): Payer: Medicare Other

## 2015-03-16 ENCOUNTER — Encounter (HOSPITAL_COMMUNITY)
Admission: RE | Admit: 2015-03-16 | Discharge: 2015-03-16 | Disposition: A | Discharge status: Home / Self Care | Payer: Medicare Other | Source: Ambulatory Visit | Attending: Cardiology | Admitting: Cardiology

## 2015-03-16 DIAGNOSIS — I5022 Chronic systolic (congestive) heart failure: Secondary | ICD-10-CM | POA: Diagnosis not present

## 2015-03-16 DIAGNOSIS — F17210 Nicotine dependence, cigarettes, uncomplicated: Secondary | ICD-10-CM | POA: Diagnosis not present

## 2015-03-16 DIAGNOSIS — Z79899 Other long term (current) drug therapy: Secondary | ICD-10-CM | POA: Diagnosis not present

## 2015-03-16 LAB — GLUCOSE, CAPILLARY
GLUCOSE-CAPILLARY: 97 mg/dL (ref 70–99)
Glucose-Capillary: 124 mg/dL — ABNORMAL HIGH (ref 70–99)

## 2015-03-16 NOTE — Progress Notes (Signed)
[name] 49 y.o. male Nutrition Note Spoke with pt. Nutrition Plan reviewed with pt. Pt states he is no longer diabetic because he controls his blood sugar with wt loss and exercise. Pt educated re: diabetes dx. Last A1c indicates blood glucose well-controlled. Pre- and post-prandial recommended CBG ranges reviewed with pt. Pt expressed understanding of the information reviewed. Lab Results  Component Value Date   HGBA1C 6.5* 01/22/2015   Nutrition Diagnosis ? Food-and nutrition-related knowledge deficit related to lack of exposure to information as related to diagnosis of: ? CVD ? DM  ? Obesity related to excessive energy intake as evidenced by a BMI of 59.2  Nutrition RX/ Estimated Daily Nutrition Needs for: wt loss 2950-3200 Kcal, 80-89 gm fat, 19-21 gm sat fat, 2.9-3.2 gm trans-fat, <1500 mg sodium  Nutrition Intervention ? Pt's individual nutrition plan reviewed with pt. ? Benefits of adopting Therapeutic Lifestyle Changes discussed when Medficts reviewed. ? Pt to attend the Portion Distortion class ? Pt to attend the Diabetes Q & A class ? Pt to attend the   ? Nutrition I class                  ? Nutrition II class     ? Diabetes Blitz class    ? Pt given handouts for: ? Blood Glucose Goals and Hypo-/Hyperglycemia treatment ? 2900 kcal, 5 day menu ideas ? Continue client-centered nutrition education by RD, as part of interdisciplinary care. Goal(s) ? Pt to identify and limit food sources of saturated fat, trans fat, and cholesterol ? Pt to identify food quantities necessary to achieve: ? wt loss to a goal wt of 400-418 lb (181.7-189.9 kg) at graduation from cardiac rehab.  ? CBG concentrations in the normal range or as close to normal as is safely possible. Monitor and Evaluate progress toward nutrition goal with team. Derek Mound, M.Ed, RD, LDN, CDE 03/16/2015 12:15 PM

## 2015-03-16 NOTE — Progress Notes (Signed)
Quality of Life Survey Results - Pt completed Quality of Life survey as a participant in Cardiac Rehab.  Scores 21.0 or below are considered low.  Pt score very low in several areas Overall 15.31, Health and Function 10.40, physiological and spiritual 18.00, family 11.80. Pt is under the care of primary MD who manages his mental illness.  Pt is also in the process of seeing someone at [facility] who prescribed his medication.  He has not yet started counseling. Faxed Quality of Life survey for review by MD.  Pt asked if he would like to see someone here.  Pt declined and wished to pursue [facility] because he could get his medications there.  Continue to check in with pt for the establishment of counseling therapy.  Continue to emotional support pt and provide encouragement. Cherre Huger, BSN

## 2015-03-19 ENCOUNTER — Encounter (HOSPITAL_COMMUNITY): Payer: Medicare Other

## 2015-03-19 ENCOUNTER — Encounter (HOSPITAL_COMMUNITY)
Admission: RE | Admit: 2015-03-19 | Discharge: 2015-03-19 | Disposition: A | Discharge status: Home / Self Care | Payer: Medicare Other | Source: Ambulatory Visit | Attending: Cardiology | Admitting: Cardiology

## 2015-03-19 DIAGNOSIS — Z79899 Other long term (current) drug therapy: Secondary | ICD-10-CM | POA: Diagnosis not present

## 2015-03-19 DIAGNOSIS — F17210 Nicotine dependence, cigarettes, uncomplicated: Secondary | ICD-10-CM | POA: Diagnosis not present

## 2015-03-19 DIAGNOSIS — I5022 Chronic systolic (congestive) heart failure: Secondary | ICD-10-CM | POA: Diagnosis not present

## 2015-03-19 NOTE — Progress Notes (Signed)
Weight elevation noted 1.9kg from his last day of exercise - Friday 4/15.  Pt did not take his medication on Saturday due to feeling "depressed"  Pt didn't leave his home all weekend.  He had plans that fell through and he was upset about this.  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  Pt verbalized that he took his meds on Sunday and again today but did not take his diuretic medications until after exercise.  Pt will have follow up on May 2 in the heart failure clinic.  Will continue to monitor and encourage pt to be compliant with his medications.  Received phone call back from Dr. Buelah Manis office regarding his blood glucose.  Instructed him to eat protein prior to exercise to help with the post decreased blood glucose.  Pt presently is not on any medications for DM.  Pt has received long time care for depression and anxiety.  MD aware of his many complex issues. MD will continue to monitor pt closely. Continue to offer emotional support and encouragement. Cherre Huger, BSN

## 2015-03-21 ENCOUNTER — Encounter (HOSPITAL_COMMUNITY): Payer: Medicare Other

## 2015-03-21 ENCOUNTER — Encounter (HOSPITAL_COMMUNITY)
Admission: RE | Admit: 2015-03-21 | Discharge: 2015-03-21 | Disposition: A | Discharge status: Home / Self Care | Payer: Medicare Other | Source: Ambulatory Visit | Attending: Cardiology | Admitting: Cardiology

## 2015-03-21 DIAGNOSIS — Z79899 Other long term (current) drug therapy: Secondary | ICD-10-CM | POA: Diagnosis not present

## 2015-03-21 DIAGNOSIS — I5022 Chronic systolic (congestive) heart failure: Secondary | ICD-10-CM | POA: Diagnosis not present

## 2015-03-21 DIAGNOSIS — F17210 Nicotine dependence, cigarettes, uncomplicated: Secondary | ICD-10-CM | POA: Diagnosis not present

## 2015-03-21 LAB — GLUCOSE, CAPILLARY: Glucose-Capillary: 105 mg/dL — ABNORMAL HIGH (ref 70–99)

## 2015-03-23 ENCOUNTER — Encounter (HOSPITAL_COMMUNITY)
Admission: RE | Admit: 2015-03-23 | Discharge: 2015-03-23 | Disposition: A | Discharge status: Home / Self Care | Payer: Medicare Other | Source: Ambulatory Visit | Attending: Cardiology | Admitting: Cardiology

## 2015-03-23 ENCOUNTER — Encounter (HOSPITAL_COMMUNITY): Payer: Medicare Other

## 2015-03-23 DIAGNOSIS — I5022 Chronic systolic (congestive) heart failure: Secondary | ICD-10-CM | POA: Diagnosis not present

## 2015-03-23 DIAGNOSIS — F17210 Nicotine dependence, cigarettes, uncomplicated: Secondary | ICD-10-CM | POA: Diagnosis not present

## 2015-03-23 DIAGNOSIS — Z79899 Other long term (current) drug therapy: Secondary | ICD-10-CM | POA: Diagnosis not present

## 2015-03-23 NOTE — Progress Notes (Signed)
Rehab report faxed to heart failure clinic for review.  Upcoming appointment on Monday morning.  Elevation in weights noted on rehab report. Cherre Huger, BSN

## 2015-03-26 ENCOUNTER — Encounter (HOSPITAL_COMMUNITY): Payer: Medicare Other

## 2015-03-26 ENCOUNTER — Ambulatory Visit (HOSPITAL_COMMUNITY)
Admission: RE | Admit: 2015-03-26 | Discharge: 2015-03-26 | Disposition: A | Discharge status: Home / Self Care | Payer: Medicare Other | Source: Ambulatory Visit | Attending: Internal Medicine | Admitting: Internal Medicine

## 2015-03-26 VITALS — BP 118/74 | HR 63 | Wt >= 6400 oz

## 2015-03-26 DIAGNOSIS — Z7902 Long term (current) use of antithrombotics/antiplatelets: Secondary | ICD-10-CM | POA: Insufficient documentation

## 2015-03-26 DIAGNOSIS — Z86711 Personal history of pulmonary embolism: Secondary | ICD-10-CM | POA: Diagnosis not present

## 2015-03-26 DIAGNOSIS — Z720 Tobacco use: Secondary | ICD-10-CM | POA: Diagnosis not present

## 2015-03-26 DIAGNOSIS — E119 Type 2 diabetes mellitus without complications: Secondary | ICD-10-CM | POA: Diagnosis not present

## 2015-03-26 DIAGNOSIS — I429 Cardiomyopathy, unspecified: Secondary | ICD-10-CM | POA: Diagnosis not present

## 2015-03-26 DIAGNOSIS — I10 Essential (primary) hypertension: Secondary | ICD-10-CM | POA: Diagnosis not present

## 2015-03-26 DIAGNOSIS — G629 Polyneuropathy, unspecified: Secondary | ICD-10-CM

## 2015-03-26 DIAGNOSIS — E0821 Diabetes mellitus due to underlying condition with diabetic nephropathy: Secondary | ICD-10-CM

## 2015-03-26 DIAGNOSIS — F17210 Nicotine dependence, cigarettes, uncomplicated: Secondary | ICD-10-CM | POA: Diagnosis not present

## 2015-03-26 DIAGNOSIS — Z79899 Other long term (current) drug therapy: Secondary | ICD-10-CM | POA: Insufficient documentation

## 2015-03-26 DIAGNOSIS — R319 Hematuria, unspecified: Secondary | ICD-10-CM | POA: Diagnosis not present

## 2015-03-26 DIAGNOSIS — I480 Paroxysmal atrial fibrillation: Secondary | ICD-10-CM | POA: Insufficient documentation

## 2015-03-26 DIAGNOSIS — I5022 Chronic systolic (congestive) heart failure: Secondary | ICD-10-CM | POA: Insufficient documentation

## 2015-03-26 DIAGNOSIS — Z7901 Long term (current) use of anticoagulants: Secondary | ICD-10-CM

## 2015-03-26 DIAGNOSIS — M79609 Pain in unspecified limb: Secondary | ICD-10-CM

## 2015-03-26 DIAGNOSIS — R202 Paresthesia of skin: Secondary | ICD-10-CM | POA: Insufficient documentation

## 2015-03-26 LAB — VITAMIN B12: Vitamin B-12: 808 pg/mL (ref 211–911)

## 2015-03-26 MED ORDER — LOSARTAN POTASSIUM 50 MG PO TABS: 75.0000 mg | ORAL_TABLET | Freq: Every evening | ORAL | Status: DC

## 2015-03-26 NOTE — Progress Notes (Signed)
Patient ID: [name], male   DOB: Apr 26, 1966, 49 y.o.   MRN: [id number] PCP: Dr. Buelah Manis  49 yo with history of nonischemic cardiomyopathy presents for cardiology followup.  Echo in 5/15 showed EF 30-35%.  The cardiomyopathy may be due to prior ETOH abuse, he now has quit.  Past cath showed no significant coronary disease.  He is morbidly obese.  He smokes 1/2 ppd.  He is on Xarelto for a PE in 5/15. Repeat echo in 3/16 showed EF 25-30%.   [name] is now doing cardiac rehab.  He has not been getting significantly short of breath with exertion, but he does feel lightheaded and fatigued after taking his meds.  He takes all his medications together once a day.  He has been having some hematuria so is not taking his Xarelto regularly.  When he does not take Xarelto, he does not have hematuria.  He has been having leg weakness and tingling from the toes up to the thighs. He has also been getting tingling in his hands.  Diabetes has been well-controlled recently.  Weight is down 15 lbs.   ECG: NSR, IVCD (118 msec), inferior and anterolateral T wave inversions, right axis deviation.   Labs (5/13): K 4.1, creatinine 1.05 Labs (1/14): K 3.8, creatinine 1.16, BNP 54 Labs (2/14): K 3.7, creatinine 1.11, BNP 28 Labs (2/16): K 4.1, creatinine 1.03, LDL 96, HCT 40 Labs (3/16): K 3.7, K 1.13, BNP 367  PMH: 1. Nonischemic cardiomyopathy: Prior cath with no significant CAD.  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  Prior echoes with EF as low as 25%.  Echo (9/13) with EF 35-40%, moderate to severe LV dilation, diffuse hypokinesis, mild Adam. Echo (5/15) with EF 30-35%, moderate to severe LAE, normal RV size and systolic function.  Angioedema with ACEI, headaches with hydralazine/nitrates. Echo (3/16) with EF 25-30%, severe LV dilation, normal RV size and systolic function.  2. HTN: angioedema with ACEI.  3. OSA: on Bipap 4. Morbid obesity 5. Paroxysmal atrial  fibrillation: Not documented recently.   6. Smoker.  7. Anxiety/panic attacks 8. PE: 5/15, diagnosed by V/Q scan.  9. NSVT 10. Hematuria  SH: On disability, lives in [place], prior ETOH abuse but now dose not drink, no drugs, smokes 1/2 ppd.  Has son and daughter.    FH: No premature CAD.    ROS: All systems reviewed and negative except as per HPI.   BP 118/74 mmHg  Pulse 63  Wt 420 lb 8 oz (190.738 kg)  SpO2 97% General: NAD, obese.  Neck: Thick, no JVD, no thyromegaly or thyroid nodule.  Lungs: Clear to auscultation bilaterally with normal respiratory effort. CV: Nondisplaced PMI.  Heart regular S1/S2, no S3/S4, no murmur.  Trace ankle edema.  No carotid bruit.  Normal pedal pulses.  Abdomen: Soft, nontender, unable to palpate liver edge, mild distention.  Neurologic: Alert and oriented x 3.  Psych: Normal affect. Extremities: No clubbing or cyanosis.   Assessment/Plan: 1. Chronic systolic CHF: He does not appear volume overloaded.  NYHA class II symptoms.  Echo (3/16) with EF 25-30%, severely dilated LV.   - We have discussed ICD implantation.  He would not be CRT candidate.  He is not ready for ICD yet, wants to work more on medical management and repeat echo in 9/16.  If EF remains low, he is willing to get ICD.  - Continue current Toprol XL and spironolactone.  - Increase losartan to  75 mg daily.  He had angioedema with ACEI, but the risk of angioedema in this situation with ARB use is low.  BMET in 10 days.  - Continue torsemide 20 daily.    - Continue to avoid ETOH.  - He feels "bad" right after taking all his meds at once.  We discussed spacing out his medications.  2. Obesity: Weight coming down, seems to be doing well with cardiac rehab.  3. Smoking: Still smoking.  I strongly encouraged him to quit. He will consider trying nicotine patches.  4. Anxiety/Panic: This is still an issue for him.  5. Paroxysmal atrial fibrillation: No recent atrial fibrillation noted.  He is  on Xarelto for PE. 6. PE: 5/15, diagnosed by V/Q scan.  Now on Xarelto but not taking regularly because of hematuria.  Would like to continue Xarelto long-term as he is sedentary and obese, and I think that his risk for recurrent VTE would be significant.  I will get a UA today and will refer him to urology for hematuria evaluation.  7. Neuropathy: Tingling and weakness in legs from feet up and in hands is suggestive of peripheral neuropathy.  Surprising that this is worsening as his diabetes now seems to be under reasonable control.  This is quite bothersome.  - I will check B12 and hgbA1c today.  - Arrange for evaluation by neurology.   Followup in 6 wks.   [name] 03/26/2015

## 2015-03-26 NOTE — Patient Instructions (Signed)
Increase Losartan to 75 mg (1 and 1/2 tabs) daily, every evening  Labs today  Labs in 10 days (bmet)  You have been referred to [facility] for hematuria  You have been referred to [facility] for neuropathy  Your physician recommends that you schedule a follow-up appointment in: 6 weeks

## 2015-03-27 ENCOUNTER — Telehealth (HOSPITAL_COMMUNITY): Payer: Self-pay | Admitting: *Deleted

## 2015-03-27 ENCOUNTER — Telehealth (HOSPITAL_COMMUNITY): Payer: Self-pay | Admitting: Vascular Surgery

## 2015-03-27 LAB — HEMOGLOBIN A1C
Hgb A1c MFr Bld: 6.2 % — ABNORMAL HIGH (ref 4.8–5.6)
Mean Plasma Glucose: 131 mg/dL

## 2015-03-27 NOTE — Telephone Encounter (Signed)
-----   Message from [name], MD sent at 03/27/2015 10:11 AM EDT ----- Regarding: RE: High Intensity Training Change to Exercise Prescription That would be fine ----- Message -----    From: Clotilde Dieter    Sent: 03/27/2015   8:33 AM      To: [name], MD Subject: High Intensity Training Change to Exercise P#  Dr. Aundra Dubin,  Pt is interested in doing high intensity interval training (HIIT) in Cardiac Rehab.  They have been in program for 4 weeks and have been doing great.  We would like to change their exercise prescription to include HIIT.  Their current THR is 87-139 (50-80%) and we would like to increase it to 165 max (95 %) for HIIT.  Their RPE levels for the HIIT would reach up to 16-17 and active rest would be 11-13.  They would start at 2 min of active rest and 30 sec of high intensity and progress as tolerated. If you are agreeable to this change in exercise prescription please let us know.  Thanks so much for your help!  Alberteen Sam, MA, ACSM RCEP

## 2015-03-28 ENCOUNTER — Encounter (HOSPITAL_COMMUNITY): Payer: Medicare Other

## 2015-03-30 ENCOUNTER — Encounter (HOSPITAL_COMMUNITY): Payer: Medicare Other

## 2015-04-02 ENCOUNTER — Encounter (HOSPITAL_COMMUNITY): Payer: Medicare Other

## 2015-04-04 ENCOUNTER — Encounter (HOSPITAL_COMMUNITY)
Admission: RE | Admit: 2015-04-04 | Discharge: 2015-04-04 | Disposition: A | Discharge status: Home / Self Care | Payer: Medicare Other | Source: Ambulatory Visit | Attending: Internal Medicine | Admitting: Internal Medicine

## 2015-04-04 ENCOUNTER — Encounter (HOSPITAL_COMMUNITY): Payer: Medicare Other

## 2015-04-04 ENCOUNTER — Other Ambulatory Visit (HOSPITAL_COMMUNITY): Payer: Medicare Other

## 2015-04-04 DIAGNOSIS — Z79899 Other long term (current) drug therapy: Secondary | ICD-10-CM | POA: Insufficient documentation

## 2015-04-04 DIAGNOSIS — F17210 Nicotine dependence, cigarettes, uncomplicated: Secondary | ICD-10-CM | POA: Insufficient documentation

## 2015-04-04 DIAGNOSIS — I5022 Chronic systolic (congestive) heart failure: Secondary | ICD-10-CM | POA: Diagnosis not present

## 2015-04-04 NOTE — Progress Notes (Signed)
Pt returned to exercise after absence from cardiac rehab since 03/23/15.  Pt weight elevated 1.9 kg (4.28 pounds).  Pt has not taken his diuretic therapy due to traveling to [place] for family funeral. Pt has not made the changes in his medications as indicated by [name] on his office visit on 4/25.  Pt did not have lab work drawn since he decided not to increase the medication as advised.  Pt with history of non compliance with medical therapy. Pt indicated the reason he did not take his medication because he was traveling and did not want to stop. Strongly advised pt that he must be compliant with his medically therapy to continue in cardiac rehab.  Pt would like to participate in high intensity training.  Will hold on this until pt is successful with attendance and with compliance to medication.  Pt reluctantly agreed after arguing regarding his attendence. Will closely monitor pt for adherence to agreement. Cherre Huger, BSN

## 2015-04-06 ENCOUNTER — Encounter (HOSPITAL_COMMUNITY): Payer: Medicare Other

## 2015-04-09 ENCOUNTER — Encounter (HOSPITAL_COMMUNITY): Payer: Medicare Other

## 2015-04-09 ENCOUNTER — Encounter (HOSPITAL_COMMUNITY)
Admission: RE | Admit: 2015-04-09 | Discharge: 2015-04-09 | Disposition: A | Discharge status: Home / Self Care | Payer: Medicare Other | Source: Ambulatory Visit | Attending: Cardiology | Admitting: Cardiology

## 2015-04-09 DIAGNOSIS — I5022 Chronic systolic (congestive) heart failure: Secondary | ICD-10-CM | POA: Diagnosis not present

## 2015-04-09 DIAGNOSIS — F17210 Nicotine dependence, cigarettes, uncomplicated: Secondary | ICD-10-CM | POA: Diagnosis not present

## 2015-04-09 DIAGNOSIS — Z79899 Other long term (current) drug therapy: Secondary | ICD-10-CM | POA: Diagnosis not present

## 2015-04-11 ENCOUNTER — Encounter (HOSPITAL_COMMUNITY): Payer: Medicare Other

## 2015-04-11 ENCOUNTER — Encounter (HOSPITAL_COMMUNITY)
Admission: RE | Admit: 2015-04-11 | Discharge: 2015-04-11 | Disposition: A | Discharge status: Home / Self Care | Payer: Medicare Other | Source: Ambulatory Visit | Attending: Cardiology | Admitting: Cardiology

## 2015-04-11 DIAGNOSIS — F17210 Nicotine dependence, cigarettes, uncomplicated: Secondary | ICD-10-CM | POA: Diagnosis not present

## 2015-04-11 DIAGNOSIS — Z79899 Other long term (current) drug therapy: Secondary | ICD-10-CM | POA: Diagnosis not present

## 2015-04-11 DIAGNOSIS — I5022 Chronic systolic (congestive) heart failure: Secondary | ICD-10-CM | POA: Diagnosis not present

## 2015-04-13 ENCOUNTER — Encounter (HOSPITAL_COMMUNITY): Payer: Medicare Other

## 2015-04-16 ENCOUNTER — Encounter (HOSPITAL_COMMUNITY): Payer: Medicare Other

## 2015-04-16 ENCOUNTER — Telehealth (HOSPITAL_COMMUNITY): Payer: Self-pay

## 2015-04-16 NOTE — Telephone Encounter (Signed)
Drop losartan back to 50 mg daily.  Would stay off knee for now, if no improvement may need injection.

## 2015-04-16 NOTE — Telephone Encounter (Signed)
Patient called stating he never went up on his Losartan per Dr. Aundra Dubin from 50mg  once daily to 75mg  once daily.  States he tried it for a few days and felt horrible, and that his BP is low, especially during cardiac rehab (could not give me specific readings on BP, but states it runs low 100s).  Wants to stay on Losartan 50 once daily.  Also, patient is having severe swelling and pain in his knee and in unable to participate in cardiac rehab.  Wants to know from Dr. Aundra Dubin if anything can be done.  States when he took gabapentin for awhile it made his knee "act up", so he does not want to try this anymore, and wants to know if ordering a brace would help.  Will forward this to Dr. Aundra Dubin for review and instruction.  Renee Pain

## 2015-04-16 NOTE — Telephone Encounter (Signed)
Per Dr. Aundra Dubin, patient ok to decrease Losartan back down to 50mg  once daily.  Advised to stay off knee and refrain from Cardiac Rehab x 1 week.  If not much better will need to arrange to have injection in knee..  Patient aware and agreeable to plan.  Renee Pain

## 2015-04-18 ENCOUNTER — Encounter (HOSPITAL_COMMUNITY): Payer: Medicare Other

## 2015-04-19 ENCOUNTER — Encounter: Payer: Self-pay | Admitting: Cardiology

## 2015-04-20 ENCOUNTER — Encounter (HOSPITAL_COMMUNITY): Payer: Medicare Other

## 2015-04-23 ENCOUNTER — Encounter (HOSPITAL_COMMUNITY): Payer: Medicare Other

## 2015-04-25 ENCOUNTER — Encounter (HOSPITAL_COMMUNITY): Payer: Medicare Other

## 2015-04-26 ENCOUNTER — Telehealth (HOSPITAL_COMMUNITY): Payer: Self-pay | Admitting: *Deleted

## 2015-04-26 NOTE — Telephone Encounter (Signed)
Pt absent from cardiac rehab several sessions. Pt last exercise date was 5/11.  Pt plans to return to exercise on 5/27. "dealing with some things trying to get my head right." Plan to follow up with pt in person on tomorrow. Cherre Huger, BSN

## 2015-04-27 ENCOUNTER — Encounter (HOSPITAL_COMMUNITY)
Admission: RE | Admit: 2015-04-27 | Payer: Medicare Other | Source: Ambulatory Visit | Attending: Cardiology | Admitting: Cardiology

## 2015-04-27 ENCOUNTER — Encounter (HOSPITAL_COMMUNITY): Payer: Medicare Other

## 2015-04-30 ENCOUNTER — Encounter (HOSPITAL_COMMUNITY): Payer: Medicare Other

## 2015-05-02 ENCOUNTER — Encounter (HOSPITAL_COMMUNITY): Payer: Medicare Other

## 2015-05-02 ENCOUNTER — Encounter (HOSPITAL_COMMUNITY)
Admission: RE | Admit: 2015-05-02 | Discharge: 2015-05-02 | Disposition: A | Discharge status: Home / Self Care | Payer: Medicare Other | Source: Ambulatory Visit | Attending: Internal Medicine | Admitting: Internal Medicine

## 2015-05-02 ENCOUNTER — Other Ambulatory Visit: Payer: Self-pay | Admitting: Family Medicine

## 2015-05-02 DIAGNOSIS — F17210 Nicotine dependence, cigarettes, uncomplicated: Secondary | ICD-10-CM | POA: Insufficient documentation

## 2015-05-02 DIAGNOSIS — I5022 Chronic systolic (congestive) heart failure: Secondary | ICD-10-CM | POA: Insufficient documentation

## 2015-05-02 DIAGNOSIS — Z79899 Other long term (current) drug therapy: Secondary | ICD-10-CM | POA: Diagnosis not present

## 2015-05-02 NOTE — Progress Notes (Addendum)
Returned to exercise today.  Tolerated well.  Unclear of the compliance of pt medication.  Pt with multiple complaints regarding his medication and the "need to take them.  Advised pt to talk extensively with his MD on his next visit.  Discussed the medications an the indications for them. Pt continued to joke around and not stay on task.  Continue to monitor closely. 30 day Psychosocial assessment - Pt with sporadic attendance.  Pt did not follow through with the follow up appt at with [facility].  Pt remains closed off regarding how is doing mentally. Continue to try different approaches with pt to understanding how mentally he is doing.  Pt with upcoming appt with primary MD who is very familiar with his history later this month.  Continue to support and encourage pt. Cherre Huger, BSN

## 2015-05-03 ENCOUNTER — Other Ambulatory Visit: Payer: Self-pay | Admitting: Neurology

## 2015-05-03 ENCOUNTER — Encounter: Payer: Self-pay | Admitting: Neurology

## 2015-05-03 ENCOUNTER — Other Ambulatory Visit (INDEPENDENT_AMBULATORY_CARE_PROVIDER_SITE_OTHER): Payer: Medicare Other

## 2015-05-03 ENCOUNTER — Other Ambulatory Visit: Payer: Self-pay | Admitting: *Deleted

## 2015-05-03 ENCOUNTER — Ambulatory Visit (INDEPENDENT_AMBULATORY_CARE_PROVIDER_SITE_OTHER): Payer: Medicare Other | Admitting: Neurology

## 2015-05-03 VITALS — BP 124/78 | HR 54 | Ht 69.0 in | Wt >= 6400 oz

## 2015-05-03 DIAGNOSIS — R7303 Prediabetes: Secondary | ICD-10-CM

## 2015-05-03 DIAGNOSIS — R200 Anesthesia of skin: Secondary | ICD-10-CM

## 2015-05-03 DIAGNOSIS — M79604 Pain in right leg: Secondary | ICD-10-CM

## 2015-05-03 DIAGNOSIS — R7309 Other abnormal glucose: Secondary | ICD-10-CM | POA: Diagnosis not present

## 2015-05-03 DIAGNOSIS — R29898 Other symptoms and signs involving the musculoskeletal system: Secondary | ICD-10-CM

## 2015-05-03 DIAGNOSIS — Z720 Tobacco use: Secondary | ICD-10-CM

## 2015-05-03 DIAGNOSIS — R202 Paresthesia of skin: Secondary | ICD-10-CM

## 2015-05-03 DIAGNOSIS — F17200 Nicotine dependence, unspecified, uncomplicated: Secondary | ICD-10-CM

## 2015-05-03 DIAGNOSIS — M79605 Pain in left leg: Secondary | ICD-10-CM

## 2015-05-03 DIAGNOSIS — R209 Unspecified disturbances of skin sensation: Secondary | ICD-10-CM | POA: Diagnosis not present

## 2015-05-03 DIAGNOSIS — I739 Peripheral vascular disease, unspecified: Secondary | ICD-10-CM

## 2015-05-03 LAB — SEDIMENTATION RATE: Sed Rate: 32 mm/hr — ABNORMAL HIGH (ref 0–22)

## 2015-05-03 LAB — C-REACTIVE PROTEIN: CRP: 0.8 mg/dL (ref 0.5–20.0)

## 2015-05-03 LAB — RHEUMATOID FACTOR: Rhuematoid fact SerPl-aCnc: <10 IU/mL (ref <=14)

## 2015-05-03 MED ORDER — NORTRIPTYLINE HCL 10 MG PO CAPS: ORAL_CAPSULE | ORAL | Status: DC

## 2015-05-03 NOTE — Progress Notes (Signed)
[name]   Date: 05/03/2015   [name] MRN: [id number] DOB: Jun [name], [name]   Dear Dr. Aundra Dubin:  Thank you for your kind referral of [name] for consultation of bilateral leg paresthesias and weakness. Although his history is well known to you, please allow [place] to reiterate it for the purpose of our medical record. The patient was accompanied to the clinic by wife who also provides collateral information.     History of Present Illness: [name] is a 49 y.o. right-handed African American male with heart failure, atrial fibrillation, PE on xeralto, OSA on BiPAP with oxygen, tobacco use, prior alcohol use, hypertension, morbid obesity, and depression presenting for evaluation of bilateral leg paresthesias and weakness.    Starting around 2010, he started having tingling and numbness of the entire legs which is constant. He recalls having burning sensation which started in the side of his thighs.  It is worse with walking and improved with rest.  He is actively trying to loose weight and has already lost 40lb, but when he walks for greater than 10 minutes, his legs fall asleep and become weak.  Even when shopping at [employer], he recalls his legs getting fatigued.  During these spells of weakness, he says that he cannot even lift his legs.  On one occasion, his wife said that he was unable to climb the stairs down, so slid down them.    He also complains of bilateral knee pain and buckling sensation.  He saw Dr. Merlene Laughter who performed and NCS/EMG of the legs (results not available and unknown to patient) in [place]. He did not follow-up again.  Because of cocomitent problems with depression/anxiety, his psychiatry was hoping to taper his clonazepam and started high dose gabapentin 2443m daily, but he developed swelling of the legs and worsening knee pain, so this was stopped.  He has not tried any other medication for his  pain.   He reports falling about once every 281-month and is able to get up himself.  He has occasional numbness/tingling of the hands, but nothing quite as severe as his legs.    Previously drinking heavily for [name] which includes a fifth of liquor daily in addition to 4-5 beer daily, but quit in 2000.  He has prediabetes which is being managed by diet and exercise.    Out-side paper records, electronic medical record, and images have been reviewed where available and summarized as:  Labs 03/26/2015:  HbA1c 6.2, vitamin B12 808 Labs 02/07/2014:  HIV NR MRI lumbar spine wo contrast 04/16/2015:  Lower lumbar degenerative disc disease and facet joint DJD.  Mild bilateral L4-L5 foraminal narrowing.  No significant disc protrusion.   Past Medical History  Diagnosis Date  . Chronic systolic heart failure   . Edema   . Atrial fibrillation   . Obesity, unspecified   . Anxiety state, unspecified   . Psychiatric disorder   . Obstructive sleep apnea   . History of medication noncompliance   . Alcohol abuse   . Shortness of breath   . Migraine   . Pulmonary embolism   . CHF (congestive heart failure)     Past Surgical History  Procedure Laterality Date  . Testicle surgery    . Cardiac catheterization       Medications:  Current Outpatient Prescriptions on File Prior to [name]  Medication Sig Dispense Refill  . clonazePAM (KLONOPIN) 1 MG tablet Take 1 mg  by mouth 3 (three) times daily.     Marland Kitchen escitalopram (LEXAPRO) 10 MG tablet Take 10 mg by mouth daily.    . fluticasone (FLONASE) 50 MCG/ACT nasal spray Place 2 sprays into both nostrils daily. (Patient taking differently: Place 2 sprays into both nostrils as needed. ) 16 g 2  . losartan (COZAAR) 50 MG tablet Take 1.5 tablets (75 mg total) by mouth every evening. 45 tablet 3  . metoprolol succinate (TOPROL-XL) 100 MG 24 hr tablet TAKE 1 TABLET BY MOUTH DAILY WITH OR IMMEDIATELY FOLLOWING A MEAL 30 tablet 3  . NON FORMULARY Bipap,  pressure 16/12 with 2L of O2    . pantoprazole (PROTONIX) 40 MG tablet TAKE 1 TABLET (40 MG TOTAL) BY MOUTH DAILY. 90 tablet 1  . spironolactone (ALDACTONE) 25 MG tablet TAKE 1 TABLET BY MOUTH DAILY 90 tablet 6  . torsemide (DEMADEX) [name] MG tablet Take 1 tablet ([name] mg total) by mouth daily. 30 tablet 3  . XARELTO [name] MG TABS tablet TAKE 1 TABLET BY MOUTH DAILY WITH SUPPER 30 tablet 5  . [DISCONTINUED] pravastatin (PRAVACHOL) 40 MG tablet Take 40 mg by mouth daily.    . [DISCONTINUED] sertraline (ZOLOFT) 50 MG tablet Take 1 tablet (50 mg total) by mouth daily. Take  85m daily for 1 week then increase to 1069mdaily 60 tablet 2   No current facility-administered medications on file prior to [name].    Allergies:  Allergies  Allergen Reactions  . Ace Inhibitors Anaphylaxis and Swelling  . Buspirone Other (See Comments)    dizziness    Family History: Family History  Problem Relation Age of Onset  . Hypertension      Family History  . Stroke      Family History  . Diabetes      Family History  . Cancer Mother     brain tumor  . Hypertension Mother   . Diabetes Father   . Heart disease Maternal Grandmother     Social History: History   Social History  . Marital Status: Married    Spouse Name: N/A  . Number of Children: 3  . Years of Education: N/A   Occupational History  . DISABLED    Social History Main Topics  . Smoking status: Current Every Day Smoker -- 0.50 packs/day for 30 years    Types: Cigarettes  . Smokeless tobacco: Never Used     Comment: 10/2014- using patches  . Alcohol Use: 0.0 oz/week    0 Standard drinks or equivalent per week     Comment: nothing to drink since last OV with Dr. BrHarl Bowieer patient  . Drug Use: No  . Sexual Activity: Yes   Other Topics Concern  . Not on file   Social History Narrative    He smokes about a pack per day and he has been smoking     since he was [age]ears of age.  He drinks alcohol occasionally, but he     denies  any illicit drug abuse.  He is presently on disability.    Lives with wife in a 2 story home.  Has 2 children.         Review of Systems:  CONSTITUTIONAL: No fevers, chills, night sweats, or weight loss.   EYES: No visual changes or eye pain ENT: No hearing changes.  No history of nose bleeds.   RESPIRATORY: No cough, wheezing and shortness of breath.   CARDIOVASCULAR: Negative for chest pain, and palpitations.  GI: Negative for abdominal discomfort, blood in stools or black stools.  No recent change in bowel habits.   GU:  No history of incontinence.   MUSCLOSKELETAL: +No history of joint pain or swelling.  No myalgias.   SKIN: Negative for lesions, rash, and itching.   HEMATOLOGY/ONCOLOGY: Negative for prolonged bleeding, bruising easily, and swollen nodes.  No history of cancer.   ENDOCRINE: Negative for cold or heat intolerance, polydipsia or goiter.   PSYCH:  +depression or anxiety symptoms.   NEURO: As Above.   Vital Signs:  BP 124/78 mmHg  Pulse 54  Ht _0  (1.753 m)  Wt 421 lb 4 oz (191.078 kg)  BMI 62.18 kg/m2  SpO2 96%   General Medical Exam:   General:  Obese, comfortable appearing.   Eyes/ENT: see cranial nerve examination.   Neck: No masses appreciated.  Full range of motion without tenderness.  No carotid bruits. Respiratory:  Clear to auscultation, good air entry bilaterally.   Cardiac:  Regular rate and rhythm, no murmur.   Extremities:  No deformities, edema, or skin discoloration.  Skin:  No rashes or lesions.  Neurological Exam: MENTAL STATUS including orientation to time, place, person, recent and remote memory, attention span and concentration, language, and fund of knowledge is normal.  Speech is not dysarthric.  CRANIAL NERVES: II:  No visual field defects.  Unremarkable fundi.   III-IV-VI: Small pupils bilaterally, equal round and reactive to light.  Exotropia bilateral eyes (old), extra-ocular eye movements in all directions of gaze.  No  nystagmus.  No ptosis.   V:  Normal facial sensation.     VII:  Normal facial symmetry and movements.  No pathologic facial reflexes.  VIII:  Normal hearing and vestibular function.   IX-X:  Normal palatal movement.   XI:  Normal shoulder shrug and head rotation.   XII:  Normal tongue strength and range of motion, no deviation or fasciculation.  MOTOR:  No atrophy, fasciculations or abnormal movements.  No pronator drift.  Tone is normal.    Right Upper Extremity:    Left Upper Extremity:    Deltoid  5/5   Deltoid  5/5   Biceps  5/5   Biceps  5/5   Triceps  5/5   Triceps  5/5   Wrist extensors  5/5   Wrist extensors  5/5   Wrist flexors  5/5   Wrist flexors  5/5   Finger extensors  5/5   Finger extensors  5/5   Finger flexors  5/5   Finger flexors  5/5   Dorsal interossei  5/5   Dorsal interossei  5/5   Abductor pollicis  5/5   Abductor pollicis  5/5   Tone (Ashworth scale)  0  Tone (Ashworth scale)  0   Right Lower Extremity:    Left Lower Extremity:    Hip flexors  5/5   Hip flexors  5/5   Hip extensors  5/5   Hip extensors  5/5   Knee flexors  5/5   Knee flexors  5/5   Knee extensors  5/5   Knee extensors  5/5   Dorsiflexors  5/5   Dorsiflexors  5/5   Plantarflexors  5/5   Plantarflexors  5/5   Toe extensors  5-/5   Toe extensors  5-/5   Toe flexors  5/5   Toe flexors  5/5   Tone (Ashworth scale)  0  Tone (Ashworth scale)  0   MSRs:  Right  Left brachioradialis 2+  brachioradialis 2+  biceps 2+  biceps 2+  triceps 2+  triceps 2+  patellar 2+  patellar 2+  ankle jerk 0  ankle jerk 0  Hoffman no  Hoffman no  plantar response mute  plantar response mute   SENSORY:  Vibration markedly reduced at the left knee and barely present at the ankles bilaterally.  Pin prick and temperature reduced at the feet.  Impaired proprioception at the great toe. Romberg's sign shows mild sway.   COORDINATION/GAIT: Normal  finger-to- nose-finger and heel-to-shin.  Intact rapid alternating movements bilaterally.  Wide-based gait due to body habitus.  He is able to walk on heels, but unable to walk on toes.  Too unsteady to perform tandem gait.   IMPRESSION: [name] is a 49 year-old gentleman with a number of medicalcomorbidities as noted above presenting for evaluation of bilateral leg paresthesias and episodic weakness with exertion.  Based on his history, neuropathy although may account for his distal paresthesias, would not manifest with entire leg numbness/tingling and severe weakness.  Therefore, I want to be sure that circulation to his lower extremities is intact, especially as his bilateral leg weakness is triggered by exercise.  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  I will request his prior EMG results to review.  His MRI report of the lumbar spine from 2013 which does not show any nerve impingement to account for symptoms.   To further investigate, I will check for treatable causes of neuropathy, although his long history of alcohol use is most likely culprit.  Also, arterial studies to evaluate for peripheral vascular disease will be ordered.   Going forward, repeat NCS/EMG may be considered, but I will wait to see what his previous study showed.   PLAN/RECOMMENDATIONS:  1.  Check ESR, CRP, vitamin B1, copper, ceruloplasmin, zinc, SPEP/UPEP with IFE, ANA, ENA, RF 2.  Check arterial studies of the legs 3.  Prior EMG has been requested for review 4.  Start nortriptyline 34m at bedtime for 2 weeks, then increase to 2 tablets at bedtime 5.  Water exercises recommended for weight loss  6.  Smoking cessation instruction/counseling given:  counseled patient on the dangers of tobacco use, advised patient to stop smoking, and reviewed strategies to maximize success  7.  Return  to clinic in 2 months   The duration of this appointment [name] was 50 minutes of face-to-face time with the patient.  Greater than 50% of this time was spent in counseling, explanation of diagnosis, planning of further management, and coordination of care.   Thank you for allowing me to participate in patient's care.  If I can answer any additional questions, I would be pleased to do so.    Sincerely,    [name] K. PPosey Pronto DO

## 2015-05-03 NOTE — Patient Instructions (Signed)
1.  Check blood work 2.  Check arterial studies of the legs 3.  Your prior EMG has been requested and we will let you know if this needs to be repeated 4.  Start nortriptyline 10mg  at bedtime for 2 weeks, then increase to 2 tablets at bedtime 5.  Return to clinic in 2 months

## 2015-05-04 ENCOUNTER — Ambulatory Visit (HOSPITAL_COMMUNITY): Payer: Medicare Other | Attending: Internal Medicine

## 2015-05-04 ENCOUNTER — Encounter (HOSPITAL_COMMUNITY): Payer: Medicare Other

## 2015-05-04 DIAGNOSIS — R202 Paresthesia of skin: Secondary | ICD-10-CM | POA: Diagnosis not present

## 2015-05-04 DIAGNOSIS — R531 Weakness: Secondary | ICD-10-CM | POA: Insufficient documentation

## 2015-05-04 DIAGNOSIS — I739 Peripheral vascular disease, unspecified: Secondary | ICD-10-CM | POA: Diagnosis not present

## 2015-05-04 LAB — ENA 9 PANEL
CENTROMERE AB SCREEN: NEGATIVE
ENA SM AB SER-ACNC: NEGATIVE
JO-1 ANTIBODY, IGG: NEGATIVE
Ribosomal P Protein Ab: <1
SM/RNP: NEGATIVE
SSA (Ro) (ENA) Antibody, IgG: <1
SSB (La) (ENA) Antibody, IgG: <1
Scleroderma (Scl-70) (ENA) Antibody, IgG: <1
ds DNA Ab: 2 IU/mL

## 2015-05-04 LAB — ANA: Anti Nuclear Antibody(ANA): NEGATIVE

## 2015-05-05 ENCOUNTER — Other Ambulatory Visit: Payer: Self-pay | Admitting: Family Medicine

## 2015-05-07 ENCOUNTER — Encounter (HOSPITAL_COMMUNITY): Payer: Medicare Other

## 2015-05-07 ENCOUNTER — Telehealth: Payer: Self-pay | Admitting: Neurology

## 2015-05-07 LAB — SPEP & IFE WITH QIG
ALPHA-1-GLOBULIN: 0.3 g/dL (ref 0.2–0.3)
ALPHA-2-GLOBULIN: 0.6 g/dL (ref 0.5–0.9)
Albumin ELP: 3.6 g/dL — ABNORMAL LOW (ref 3.8–4.8)
BETA GLOBULIN: 0.5 g/dL (ref 0.4–0.6)
Beta 2: 0.5 g/dL (ref 0.2–0.5)
Gamma Globulin: 1.4 g/dL (ref 0.8–1.7)
IGA: 470 mg/dL — AB (ref 68–379)
IGG (IMMUNOGLOBIN G), SERUM: 1720 mg/dL — AB (ref 650–1600)
IgM, Serum: 119 mg/dL (ref 41–251)
TOTAL PROTEIN, SERUM ELECTROPHOR: 6.9 g/dL (ref 6.1–8.1)

## 2015-05-07 LAB — UIFE/LIGHT CHAINS/TP QN, 24-HR UR
ALPHA 1 UR: DETECTED — AB
Albumin, U: DETECTED
Alpha 2, Urine: DETECTED — AB
BETA UR: DETECTED — AB
GAMMA UR: DETECTED — AB
TOTAL PROTEIN, URINE-UPE24: 9 mg/dL (ref 5–25)

## 2015-05-07 LAB — CERULOPLASMIN: CERULOPLASMIN: 30 mg/dL (ref 18–36)

## 2015-05-07 NOTE — Telephone Encounter (Signed)
Okay to refill? 

## 2015-05-07 NOTE — Telephone Encounter (Signed)
Pt started on Nortriptyline HCL 10mg  at appt. Pt has read that this is an anti-depressant. He is concerned b/c he currently takes Lexapro 10mg  and Klonopin. Wants to confirm that he is to take both and to see what we are using the Nortriptyline for. Also wants lab results. Please call back at [phone] / Sherri S.

## 2015-05-07 NOTE — Telephone Encounter (Signed)
Informed patient that labs are not in yet and it is ok to take the nortriptyline.

## 2015-05-07 NOTE — Telephone Encounter (Signed)
Ok to refill??  Last office visit/ refill 01/22/2015.

## 2015-05-08 DIAGNOSIS — R310 Gross hematuria: Secondary | ICD-10-CM | POA: Diagnosis not present

## 2015-05-08 LAB — COPPER, SERUM: Copper: 134 ug/dL (ref 70–175)

## 2015-05-08 LAB — VITAMIN B1: Vitamin B1 (Thiamine): 7 nmol/L — ABNORMAL LOW (ref 8–30)

## 2015-05-08 LAB — ZINC: ZINC: 70 ug/dL (ref 60–130)

## 2015-05-08 NOTE — Telephone Encounter (Signed)
Script called in to pharmacy  

## 2015-05-09 ENCOUNTER — Encounter (HOSPITAL_COMMUNITY): Payer: Medicare Other

## 2015-05-10 NOTE — Telephone Encounter (Signed)
Open in error

## 2015-05-11 ENCOUNTER — Encounter (HOSPITAL_COMMUNITY): Payer: Medicare Other

## 2015-05-14 ENCOUNTER — Encounter (HOSPITAL_COMMUNITY): Payer: Medicare Other

## 2015-05-16 ENCOUNTER — Encounter (HOSPITAL_COMMUNITY): Payer: Medicare Other

## 2015-05-16 ENCOUNTER — Encounter (HOSPITAL_COMMUNITY)
Admission: RE | Admit: 2015-05-16 | Payer: Medicare Other | Source: Ambulatory Visit | Attending: Cardiology | Admitting: Cardiology

## 2015-05-17 ENCOUNTER — Telehealth (HOSPITAL_COMMUNITY): Payer: Self-pay | Admitting: *Deleted

## 2015-05-17 NOTE — Telephone Encounter (Signed)
Pt absent for several sessions. No calls received.  Pt with prior sporadic attendance.  Mailed letter to pt indicating he will need to return parking badge issued in February for new patients in the program to receive the benefit of reserved parking. Cherre Huger, BSN

## 2015-05-18 ENCOUNTER — Encounter (HOSPITAL_COMMUNITY): Payer: Medicare Other

## 2015-05-18 ENCOUNTER — Encounter (HOSPITAL_COMMUNITY)
Admission: RE | Admit: 2015-05-18 | Payer: Medicare Other | Source: Ambulatory Visit | Attending: Cardiology | Admitting: Cardiology

## 2015-05-21 ENCOUNTER — Encounter (HOSPITAL_COMMUNITY): Payer: Medicare Other

## 2015-05-23 ENCOUNTER — Ambulatory Visit (INDEPENDENT_AMBULATORY_CARE_PROVIDER_SITE_OTHER): Payer: Medicare Other | Admitting: Family Medicine

## 2015-05-23 ENCOUNTER — Encounter: Payer: Self-pay | Admitting: Family Medicine

## 2015-05-23 VITALS — BP 132/78 | HR 78 | Temp 97.9°F | Resp 16

## 2015-05-23 DIAGNOSIS — I10 Essential (primary) hypertension: Secondary | ICD-10-CM | POA: Diagnosis not present

## 2015-05-23 DIAGNOSIS — E785 Hyperlipidemia, unspecified: Secondary | ICD-10-CM | POA: Diagnosis not present

## 2015-05-23 DIAGNOSIS — F411 Generalized anxiety disorder: Secondary | ICD-10-CM

## 2015-05-23 DIAGNOSIS — E0821 Diabetes mellitus due to underlying condition with diabetic nephropathy: Secondary | ICD-10-CM | POA: Diagnosis not present

## 2015-05-23 DIAGNOSIS — I5022 Chronic systolic (congestive) heart failure: Secondary | ICD-10-CM | POA: Diagnosis not present

## 2015-05-23 DIAGNOSIS — F331 Major depressive disorder, recurrent, moderate: Secondary | ICD-10-CM

## 2015-05-23 DIAGNOSIS — J069 Acute upper respiratory infection, unspecified: Secondary | ICD-10-CM | POA: Diagnosis not present

## 2015-05-23 MED ORDER — AMOXICILLIN 875 MG PO TABS: 875.0000 mg | ORAL_TABLET | Freq: Two times a day (BID) | ORAL | Status: DC

## 2015-05-23 NOTE — Patient Instructions (Addendum)
Release of records- Dr. Merlene Laughter- Nerve Conduction Test from about 10 years ago  Increase nortriptyline to 2 tablets at bedtime Take the demadex- take 2 tablets for next 3 days  Mucinex DM- twice a day  Take the antibiotics if not better  Use nasal spray  Psychiatrist- referral F/u 4 MONTHS

## 2015-05-24 NOTE — Assessment & Plan Note (Signed)
Restart diuretics, does not appear to be decompensated but this can quickly change

## 2015-05-24 NOTE — Assessment & Plan Note (Signed)
Repeat A1C down to 6.2%, no meds at this time Note he will f/u with urology I recommend he have cystoscopy done Will also titrate his pamelor to 20mg  as recommended by neurology, we will attempt to find the old nerve conduction

## 2015-05-24 NOTE — Assessment & Plan Note (Signed)
Continue current meds, I query a diagnosis of bipolar in him- send to new psychiatrist he wants to see someone out in [place]

## 2015-05-24 NOTE — Progress Notes (Signed)
Patient ID: CHESLEY VEASEY, male   DOB: 1966-09-29, 49 y.o.   MRN: [id number]   Subjective:    Patient ID: DAYN BARICH, male    DOB: Jan 07, 1966, 49 y.o.   MRN: [id number]  Patient presents for 4 month F/U and Abdominal Bloating  Reviewed notes from cardiology, note has not had fluid pill in a few days feels bloated, unable to weight on our scale. Denies SOB, but has had cough with production, sore throat, nasla drainage for past week. Taking OTC mucinex D max. No fever.  Upset about urology appt because they did a rectal exam, now he does not want to have cystoscopy done for his hematuria.  No longer going to psychiatrist at [facility], states they would not call him back or give him appointments, taking most meds as prescribed  Neuropathy- seen by neurology, they recommended Pamelor titrated up to 20mg , he is only taking 10mg , trying to find his old nerve conduction because he does  Not want this test done again.       Review Of Systems:  GEN- denies fatigue, fever, weight loss,weakness, recent illness HEENT- denies eye drainage, change in vision,+ nasal discharge, CVS- denies chest pain, palpitations RESP- denies SOB, +cough, wheeze ABD- denies N/V, change in stools, abd pain GU- denies dysuria, hematuria, dribbling, incontinence MSK-+ joint pain, muscle aches, injury Neuro- denies headache, dizziness, syncope, seizure activity       Objective:    BP 132/78 mmHg  Pulse 78  Temp(Src) 97.9 F (36.6 C) (Oral)  Resp 16 GEN- NAD, alert and oriented x3 HEENT- PERRL, EOMI, non injected sclera, pink conjunctiva, MMM, oropharynx mild erythema, no exudates, nasal drainge noted, TM clear no effusion, no maxillary sinus tenderness Neck- Supple, no LAD CVS- RRR, no murmur RESP-CTAB, upper airway nasal congestion heard ABD-NABS,soft,NT,ND EXT- pedal edema Pulses- Radial  2+        Assessment & Plan:      Problem List Items Addressed This Visit    Systolic CHF, chronic   Restart diuretics, does not appear to be decompensated but this can quickly change      Morbid obesity   Major depression (Chronic)   Hyperlipidemia   Essential hypertension    Controlled no change to meds      Diabetes mellitus    Repeat A1C down to 6.2%, no meds at this time Note he will f/u with urology I recommend he have cystoscopy done Will also titrate his pamelor to 20mg  as recommended by neurology, we will attempt to find the old nerve conduction      Anxiety state    Continue current meds, I query a diagnosis of bipolar in him- send to new psychiatrist he wants to see someone out in [place]       Other Visit Diagnoses    Acute URI    -  Primary    Chane to mucinex DM, no decongestant due to cardiac history, nasal spray, if not improved then start amox this weekend       Note: This dictation was prepared with Dragon dictation along with smaller phrase technology. Any transcriptional errors that result from this process are unintentional.

## 2015-05-24 NOTE — Assessment & Plan Note (Signed)
Controlled no change to meds 

## 2015-05-28 ENCOUNTER — Ambulatory Visit (HOSPITAL_COMMUNITY): Payer: Medicare Other

## 2015-05-29 ENCOUNTER — Telehealth: Payer: Self-pay | Admitting: Neurology

## 2015-05-29 NOTE — Telephone Encounter (Signed)
Noted. I would recommend EMG of the legs as we discussed, if he is agreeable.

## 2015-05-29 NOTE — Telephone Encounter (Signed)
FYI

## 2015-05-29 NOTE — Telephone Encounter (Signed)
Pt old neuro office called and stated that he no showed the EMG so they dont have any test results to send

## 2015-05-30 ENCOUNTER — Ambulatory Visit (HOSPITAL_COMMUNITY): Payer: Medicare Other

## 2015-05-31 NOTE — Telephone Encounter (Signed)
Called patient and he would like a sooner appointment to discuss knot in the middle of his back and the EMG.

## 2015-05-31 NOTE — Telephone Encounter (Signed)
Patient coming in tomorrow at 3:30.

## 2015-06-01 ENCOUNTER — Ambulatory Visit: Payer: Medicare Other | Admitting: Neurology

## 2015-06-01 ENCOUNTER — Ambulatory Visit (HOSPITAL_COMMUNITY): Payer: Medicare Other

## 2015-06-01 ENCOUNTER — Ambulatory Visit (INDEPENDENT_AMBULATORY_CARE_PROVIDER_SITE_OTHER): Payer: Medicare Other | Admitting: Neurology

## 2015-06-01 ENCOUNTER — Encounter: Payer: Self-pay | Admitting: Neurology

## 2015-06-01 VITALS — BP 140/90 | HR 73 | Ht 69.0 in | Wt >= 6400 oz

## 2015-06-01 DIAGNOSIS — M79605 Pain in left leg: Secondary | ICD-10-CM | POA: Diagnosis not present

## 2015-06-01 DIAGNOSIS — R202 Paresthesia of skin: Secondary | ICD-10-CM | POA: Diagnosis not present

## 2015-06-01 DIAGNOSIS — G5710 Meralgia paresthetica, unspecified lower limb: Secondary | ICD-10-CM | POA: Diagnosis not present

## 2015-06-01 DIAGNOSIS — M79604 Pain in right leg: Secondary | ICD-10-CM | POA: Diagnosis not present

## 2015-06-01 DIAGNOSIS — Z720 Tobacco use: Secondary | ICD-10-CM | POA: Diagnosis not present

## 2015-06-01 DIAGNOSIS — F17200 Nicotine dependence, unspecified, uncomplicated: Secondary | ICD-10-CM

## 2015-06-01 NOTE — Progress Notes (Signed)
Follow-up Visit   Date: 06/01/2015    [name] MRN: [id number] DOB: 10-23-1966   Interim History: [name] is a 49 y.o. right-handed African American male with heart failure, atrial fibrillation, PE on xeralto, OSA on BiPAP with oxygen, tobacco use, prior alcohol use, hypertension, morbid obesity, and depression returning for follow-up of bilateral leg paresthesias and weakness.   History of present illness: Starting around 2010, he started having tingling and numbness of the entire legs which is constant. He recalls having burning sensation which started in the side of his thighs. It is worse with walking and improved with rest. He is actively trying to loose weight and has already lost 40lb, but when he walks for greater than 10 minutes, his legs fall asleep and become weak. Even when shopping at [employer], he recalls his legs getting fatigued. During these spells of weakness, he says that he cannot even lift his legs. On one occasion, his wife said that he was unable to climb the stairs down, so slid down them.   He also complains of bilateral knee pain and buckling sensation. He saw Dr. Merlene Laughter who performed and NCS/EMG of the legs (results not available and unknown to patient) in [place]. He did not follow-up again. Because of cocomitent problems with depression/anxiety, his psychiatry was hoping to taper his clonazepam and started high dose gabapentin 2464m daily, but he developed swelling of the legs and worsening knee pain, so this was stopped. He has not tried any other medication for his pain.   He reports falling about once every 219-month and is able to get up himself. He has occasional numbness/tingling of the hands, but nothing quite as severe as his legs.   Previously drinking heavily for 20 years which includes a fifth of liquor daily in addition to 4-5 beer daily, but quit in 2000. He has prediabetes which is being managed by diet and  exercise.   UPDATE 06/01/2015:  He called and scheduled sooner appointment because he started experiencing a "knot" in his back.  This is improved with massage and stretching, but he is concerned that something may be growing.  He continues to have bilateral leg weakness and numbness, especially when standing.  The numbness and pain is mostly involves his lateral thigh.  Arterial studies shows normal vessels. EMG report was requested from Dr. DoFreddie Apleyffice but we were told patient no-showed to the appointment, however patient is able to describe the test very well.  He started taking nortriptyline 1038mntermittently.  No side effects.    Medications:  Current Outpatient Prescriptions on File Prior to Visit  Medication Sig Dispense Refill  . clonazePAM (KLONOPIN) 1 MG tablet TAKE 1 TABLET BY MOUTH 3 TIMES A DAY AS NEEDED ANXIETY 90 tablet 0  . escitalopram (LEXAPRO) 10 MG tablet Take 10 mg by mouth daily.    . fluticasone (FLONASE) 50 MCG/ACT nasal spray Place 2 sprays into both nostrils daily. (Patient taking differently: Place 2 sprays into both nostrils as needed. ) 16 g 2  . losartan (COZAAR) 50 MG tablet Take 1.5 tablets (75 mg total) by mouth every evening. 45 tablet 3  . metoprolol succinate (TOPROL-XL) 100 MG 24 hr tablet TAKE 1 TABLET BY MOUTH DAILY WITH OR IMMEDIATELY FOLLOWING A MEAL 30 tablet 3  . NON FORMULARY Bipap, pressure 16/12 with 2L of O2    . nortriptyline (PAMELOR) 10 MG capsule Take 1 tablet at bedtime for 2 weeks, then increase to 2  tablets daily. 60 capsule 5  . pantoprazole (PROTONIX) 40 MG tablet TAKE 1 TABLET (40 MG TOTAL) BY MOUTH DAILY. 90 tablet 1  . Phenylephrine-APAP-Guaifenesin (MUCINEX FAST-MAX COLD & SINUS) 10-650-400 MG/20ML LIQD Take by mouth.    . spironolactone (ALDACTONE) 25 MG tablet TAKE 1 TABLET BY MOUTH DAILY 90 tablet 6  . thiamine (VITAMIN B-1) 100 MG tablet Take 100 mg by mouth daily.    Marland Kitchen torsemide (DEMADEX) 20 MG tablet Take 1 tablet (20 mg  total) by mouth daily. 30 tablet 3  . XARELTO 20 MG TABS tablet TAKE 1 TABLET BY MOUTH DAILY WITH SUPPER 30 tablet 5  . [DISCONTINUED] pravastatin (PRAVACHOL) 40 MG tablet Take 40 mg by mouth daily.    . [DISCONTINUED] sertraline (ZOLOFT) 50 MG tablet Take 1 tablet (50 mg total) by mouth daily. Take  69m daily for 1 week then increase to 1044mdaily 60 tablet 2   No current facility-administered medications on file prior to visit.    Allergies:  Allergies  Allergen Reactions  . Ace Inhibitors Anaphylaxis and Swelling  . Buspirone Other (See Comments)    dizziness    Review of Systems:  CONSTITUTIONAL: No fevers, chills, night sweats, or weight loss.  EYES: No visual changes or eye pain ENT: No hearing changes.  No history of nose bleeds.   RESPIRATORY: No cough, wheezing and shortness of breath.   CARDIOVASCULAR: Negative for chest pain, and palpitations.   GI: Negative for abdominal discomfort, blood in stools or black stools.  No recent change in bowel habits.   GU:  No history of incontinence.   MUSCLOSKELETAL: No history of joint pain or swelling.  No myalgias.   SKIN: Negative for lesions, rash, and itching.   ENDOCRINE: Negative for cold or heat intolerance, polydipsia or goiter.   PSYCH:  No depression or anxiety symptoms.   NEURO: As Above.   Vital Signs:  BP 140/90 mmHg  Pulse 73  Ht _0  (1.753 m)  Wt 426 lb 5 oz (193.374 kg)  BMI 62.93 kg/m2  SpO2 98%  Neurological Exam: MENTAL STATUS including orientation to time, place, person, recent and remote memory, attention span and concentration, language, and fund of knowledge is normal.  Speech is not dysarthric.  CRANIAL NERVES: No visual field defects. Pupils equal round and reactive to light.  Normal conjugate, extra-ocular eye movements in all directions of gaze. 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  No ptosis. Normal facial sensation.  Face is symmetric. Palate elevates symmetrically.  Tongue is midline.  MOTOR:   Motor strength is 5/5 in all extremities, except toe extensors are 5-/5.  No atrophy, fasciculations or abnormal movements.  No pronator drift.  Tone is normal.    MSRs:  Reflexes are 2+/4 throughout, except absent Achilles bilaterally.  SENSORY:  Reduced light touch over the anterolateral thigh bilaterally  COORDINATION/GAIT:  Wide-based gait  Data: Labs 05/03/2015:  ANA neg, copper 134, copper 30, ESR 32*, CRP 0.8, vitamin B1 7*, zinc 70, SPEP with IFE no M protein *, ENA neg, RF <10 Labs 03/26/2015: HbA1c 6.2, vitamin B12 808 Labs 02/07/2014: HIV NR  MRI lumbar spine wo contrast 04/15/2012: Lower lumbar degenerative disc disease and facet joint DJD. Mild bilateral L4-L5 foraminal narrowing. No significant disc protrusion.  Arterial studies of the legs:  Normal  IMPRESSION/PLAN: 1.  Bilateral leg paresthesias, most consistent with meralgia paresthetica  - Increase nortriptyline to 2025maily  - Previously tried:  neurontin  - Declined lidocaine ointment, offered OTC  Aspercream as an alternative  - Avoid tight clothing, weight loss encouraged and he is going to the gym daily  2.  Alcoholic peripheral neuropathy  - EMG of the right side  - Stressed alcohol cessation  3.  Episodic weakness with exertion  - Patent circulation as evidenced by normal arterial studies  - MRI report of the lumbar spine from 2013 which does not show any nerve impingement to account for symptoms  - Neuropathy labs are normal  - NCS/EMG of the right arm and leg  - Start PT   4.  Muscle spasm most likely represents the "knot" he experiences  - Encouraged him to use soft tissue massage, heat, or topical analgesic  - Consider muscle relaxant if worsens .  Return to clinic in 4-8 weeks   The duration of this appointment visit was 35 minutes of face-to-face time with the patient.  Greater than 50% of this time was spent in counseling, explanation of diagnosis, planning of further management, and  coordination of care.   Thank you for allowing me to participate in patient's care.  If I can answer any additional questions, I would be pleased to do so.    Sincerely,    Donika K. Posey Pronto, DO

## 2015-06-01 NOTE — Patient Instructions (Addendum)
Start taking nortriptyline 20mg  at bedime for two weeks, then increase to 3 tablets Asper cream is another over the counter cream you can apply Start physical therapy  EMG of the right arm and leg Return to clinic after EMG

## 2015-06-06 ENCOUNTER — Ambulatory Visit (HOSPITAL_COMMUNITY): Payer: Medicare Other

## 2015-06-08 ENCOUNTER — Ambulatory Visit (HOSPITAL_COMMUNITY): Payer: Medicare Other

## 2015-06-11 ENCOUNTER — Ambulatory Visit (HOSPITAL_COMMUNITY): Payer: Medicare Other

## 2015-06-12 DIAGNOSIS — R310 Gross hematuria: Secondary | ICD-10-CM | POA: Diagnosis not present

## 2015-06-12 DIAGNOSIS — D3502 Benign neoplasm of left adrenal gland: Secondary | ICD-10-CM | POA: Diagnosis not present

## 2015-06-12 DIAGNOSIS — K429 Umbilical hernia without obstruction or gangrene: Secondary | ICD-10-CM | POA: Diagnosis not present

## 2015-06-13 ENCOUNTER — Ambulatory Visit (HOSPITAL_COMMUNITY): Payer: Medicare Other

## 2015-06-15 ENCOUNTER — Ambulatory Visit (HOSPITAL_COMMUNITY): Payer: Medicare Other

## 2015-06-18 ENCOUNTER — Ambulatory Visit (HOSPITAL_COMMUNITY): Payer: Medicare Other

## 2015-06-20 ENCOUNTER — Ambulatory Visit (HOSPITAL_COMMUNITY): Payer: Medicare Other

## 2015-06-21 ENCOUNTER — Other Ambulatory Visit: Payer: Self-pay | Admitting: Family Medicine

## 2015-06-21 NOTE — Telephone Encounter (Signed)
LRF 05/08/15 #90  LOV 05/23/15  OK refill?

## 2015-06-22 ENCOUNTER — Ambulatory Visit (HOSPITAL_COMMUNITY): Payer: Medicare Other

## 2015-06-22 NOTE — Telephone Encounter (Signed)
okay

## 2015-06-22 NOTE — Telephone Encounter (Signed)
Medication refilled per protocol. 

## 2015-06-25 ENCOUNTER — Ambulatory Visit (HOSPITAL_COMMUNITY): Payer: Medicare Other

## 2015-06-26 ENCOUNTER — Encounter: Payer: Medicare Other | Admitting: Neurology

## 2015-06-27 ENCOUNTER — Ambulatory Visit (HOSPITAL_COMMUNITY): Payer: Medicare Other

## 2015-06-28 ENCOUNTER — Telehealth: Payer: Self-pay | Admitting: *Deleted

## 2015-06-28 ENCOUNTER — Encounter: Payer: Self-pay | Admitting: *Deleted

## 2015-06-28 NOTE — Telephone Encounter (Signed)
No show letter sent for missed EMG on 06/26/15

## 2015-06-29 ENCOUNTER — Ambulatory Visit (HOSPITAL_COMMUNITY): Payer: Medicare Other

## 2015-07-02 ENCOUNTER — Ambulatory Visit (HOSPITAL_COMMUNITY): Payer: Medicare Other

## 2015-07-04 ENCOUNTER — Ambulatory Visit (HOSPITAL_COMMUNITY): Payer: Medicare Other

## 2015-07-06 ENCOUNTER — Ambulatory Visit: Payer: Medicare Other | Admitting: Neurology

## 2015-07-06 ENCOUNTER — Ambulatory Visit (HOSPITAL_COMMUNITY): Payer: Medicare Other

## 2015-07-09 ENCOUNTER — Ambulatory Visit (HOSPITAL_COMMUNITY): Payer: Medicare Other

## 2015-07-11 ENCOUNTER — Ambulatory Visit (HOSPITAL_COMMUNITY): Payer: Medicare Other

## 2015-07-13 ENCOUNTER — Ambulatory Visit (HOSPITAL_COMMUNITY): Payer: Medicare Other

## 2015-07-16 ENCOUNTER — Ambulatory Visit (HOSPITAL_COMMUNITY): Payer: Medicare Other

## 2015-07-18 ENCOUNTER — Ambulatory Visit (HOSPITAL_COMMUNITY): Payer: Medicare Other

## 2015-07-20 ENCOUNTER — Other Ambulatory Visit: Payer: Self-pay | Admitting: Family Medicine

## 2015-07-20 ENCOUNTER — Ambulatory Visit (HOSPITAL_COMMUNITY): Payer: Medicare Other

## 2015-07-20 NOTE — Telephone Encounter (Signed)
Medication refilled per protocol. 

## 2015-07-23 ENCOUNTER — Ambulatory Visit (HOSPITAL_COMMUNITY): Payer: Medicare Other

## 2015-07-25 ENCOUNTER — Ambulatory Visit (HOSPITAL_COMMUNITY): Payer: Medicare Other

## 2015-07-27 ENCOUNTER — Telehealth (HOSPITAL_COMMUNITY): Payer: Self-pay | Admitting: Cardiology

## 2015-07-27 ENCOUNTER — Emergency Department (HOSPITAL_COMMUNITY): Payer: Medicare Other

## 2015-07-27 ENCOUNTER — Ambulatory Visit (HOSPITAL_COMMUNITY): Payer: Medicare Other

## 2015-07-27 ENCOUNTER — Telehealth (HOSPITAL_COMMUNITY): Payer: Self-pay | Admitting: Internal Medicine

## 2015-07-27 ENCOUNTER — Emergency Department (HOSPITAL_COMMUNITY)
Admission: EM | Admit: 2015-07-27 | Discharge: 2015-07-28 | Discharge status: Against Medical Advice | Payer: Medicare Other | Attending: Emergency Medicine | Admitting: Emergency Medicine

## 2015-07-27 ENCOUNTER — Encounter (HOSPITAL_COMMUNITY): Payer: Self-pay | Admitting: Emergency Medicine

## 2015-07-27 DIAGNOSIS — R1084 Generalized abdominal pain: Secondary | ICD-10-CM | POA: Insufficient documentation

## 2015-07-27 DIAGNOSIS — Z79899 Other long term (current) drug therapy: Secondary | ICD-10-CM | POA: Diagnosis not present

## 2015-07-27 DIAGNOSIS — Z8669 Personal history of other diseases of the nervous system and sense organs: Secondary | ICD-10-CM | POA: Insufficient documentation

## 2015-07-27 DIAGNOSIS — G43909 Migraine, unspecified, not intractable, without status migrainosus: Secondary | ICD-10-CM | POA: Insufficient documentation

## 2015-07-27 DIAGNOSIS — Z7901 Long term (current) use of anticoagulants: Secondary | ICD-10-CM | POA: Insufficient documentation

## 2015-07-27 DIAGNOSIS — R0602 Shortness of breath: Secondary | ICD-10-CM | POA: Diagnosis not present

## 2015-07-27 DIAGNOSIS — Z9889 Other specified postprocedural states: Secondary | ICD-10-CM | POA: Diagnosis not present

## 2015-07-27 DIAGNOSIS — E669 Obesity, unspecified: Secondary | ICD-10-CM | POA: Insufficient documentation

## 2015-07-27 DIAGNOSIS — F419 Anxiety disorder, unspecified: Secondary | ICD-10-CM | POA: Diagnosis not present

## 2015-07-27 DIAGNOSIS — I4891 Unspecified atrial fibrillation: Secondary | ICD-10-CM | POA: Insufficient documentation

## 2015-07-27 DIAGNOSIS — R778 Other specified abnormalities of plasma proteins: Secondary | ICD-10-CM

## 2015-07-27 DIAGNOSIS — Z720 Tobacco use: Secondary | ICD-10-CM | POA: Insufficient documentation

## 2015-07-27 DIAGNOSIS — R7989 Other specified abnormal findings of blood chemistry: Secondary | ICD-10-CM

## 2015-07-27 DIAGNOSIS — I5022 Chronic systolic (congestive) heart failure: Secondary | ICD-10-CM | POA: Insufficient documentation

## 2015-07-27 DIAGNOSIS — R042 Hemoptysis: Secondary | ICD-10-CM | POA: Insufficient documentation

## 2015-07-27 DIAGNOSIS — R740 Nonspecific elevation of levels of transaminase and lactic acid dehydrogenase [LDH]: Secondary | ICD-10-CM | POA: Diagnosis not present

## 2015-07-27 DIAGNOSIS — Z9119 Patient's noncompliance with other medical treatment and regimen: Secondary | ICD-10-CM | POA: Insufficient documentation

## 2015-07-27 DIAGNOSIS — Z86711 Personal history of pulmonary embolism: Secondary | ICD-10-CM | POA: Insufficient documentation

## 2015-07-27 DIAGNOSIS — R079 Chest pain, unspecified: Secondary | ICD-10-CM

## 2015-07-27 DIAGNOSIS — I517 Cardiomegaly: Secondary | ICD-10-CM | POA: Diagnosis not present

## 2015-07-27 LAB — CBC
HEMATOCRIT: 40.7 % (ref 39.0–52.0)
HEMOGLOBIN: 14.3 g/dL (ref 13.0–17.0)
MCH: 29.1 pg (ref 26.0–34.0)
MCHC: 35.1 g/dL (ref 30.0–36.0)
MCV: 82.7 fL (ref 78.0–100.0)
Platelets: 233 10*3/uL (ref 150–400)
RBC: 4.92 MIL/uL (ref 4.22–5.81)
RDW: 14.1 % (ref 11.5–15.5)
WBC: 9.4 10*3/uL (ref 4.0–10.5)

## 2015-07-27 LAB — BASIC METABOLIC PANEL
ANION GAP: 7 (ref 5–15)
BUN: 16 mg/dL (ref 6–20)
CO2: 27 mmol/L (ref 22–32)
Calcium: 9.2 mg/dL (ref 8.9–10.3)
Chloride: 106 mmol/L (ref 101–111)
Creatinine, Ser: 1.01 mg/dL (ref 0.61–1.24)
GLUCOSE: 122 mg/dL — AB (ref 65–99)
POTASSIUM: 3.5 mmol/L (ref 3.5–5.1)
Sodium: 140 mmol/L (ref 135–145)

## 2015-07-27 LAB — TROPONIN I: TROPONIN I: 0.07 ng/mL — AB (ref <0.031)

## 2015-07-27 MED ORDER — ASPIRIN 81 MG PO CHEW
324.0000 mg | CHEWABLE_TABLET | Freq: Once | ORAL | Status: AC
  Administered 2015-07-28: 324 mg via ORAL
  Filled 2015-07-27: qty 4

## 2015-07-27 MED ORDER — NITROGLYCERIN 2 % TD OINT
1.0000 [in_us] | TOPICAL_OINTMENT | Freq: Once | TRANSDERMAL | Status: AC
  Administered 2015-07-28: 1 [in_us] via TOPICAL
  Filled 2015-07-27: qty 1

## 2015-07-27 MED ORDER — IOHEXOL 350 MG/ML SOLN
100.0000 mL | Freq: Once | INTRAVENOUS | Status: AC | PRN
  Administered 2015-07-28: 100 mL via INTRAVENOUS

## 2015-07-27 NOTE — Telephone Encounter (Signed)
Patient called with concerns regarding low b/p  Pt states he took his b/p this morning before taking his meds - 116/54 and 108/71  Please advsie Pt does have increased fatigue and dizziness

## 2015-07-27 NOTE — Telephone Encounter (Signed)
I called patient. He feels fatigued and occasionally lightheaded. Says he has been sleeping a lot. SBP 116.   I told him if symptoms persist to cut his Toprol ain half. We will see him in clinic next week. If worse over weekend can come to ER.  Bensimhon, Daniel,MD 5:59 PM

## 2015-07-27 NOTE — ED Notes (Signed)
Patient complaining of chest pain and shortness of breath starting today at 1400.

## 2015-07-27 NOTE — ED Provider Notes (Signed)
CSN: [id number]     Arrival date & time 07/27/15  2038 History  This chart was scribed for Adam Porter, MD by Rayna Sexton, ED scribe. This patient was seen in room APA06/APA06 and the patient's care was started at 11:14 PM.   Chief Complaint  Patient presents with  . Chest Pain   The history is provided by the patient. No language interpreter was used.    HPI Comments: [name] is a 49 y.o. male who presents to the Emergency Department complaining of constant, moderate, SOB with onset at 2:30 PM which alleviated for some time and occurred again at 7:30 PM. He notes a sudden onset of his SOB with associated dizziness and chest pain he describes as a tightness that lasted for 20 minutes and alleviated until 7:30 PM. With his second episode he noticed a sudden onset of the same symptoms for the same duration of time while driving which caused him to come to the ED. He notes a hx of similar chest tightness that was dx was "fluid". He notes a hx of PE and during this event notes a feeling of SOB as well as coughing up a small amount of blood. Pt also notes a hx of mild swelling to his bilateral legs. He notes checking his BP initially which read 116/52. He notes taking his medications as prescribed and denies not have been adhering to his dietary guidelines. Pt denies any history of MI.  Pt also notes constant, moderate, diffuse, abd pain with onset 1 day ago. He describes the pain as a "tightness". He denies nausea, vomiting and diaphoresis. The chest tightness did not radiate. He states he still feels weak after the episodes. He denies feeling dizzy. He also complains of some right-sided sharp chest pains that accompanied the chest tightness and also last 20-25 minutes.  PCP Dr [facility] Cardiology [name]  Past Medical History  Diagnosis Date  . Chronic systolic heart failure   . Edema   . Atrial fibrillation   . Obesity, unspecified   . Anxiety state, unspecified   . Psychiatric disorder    . Obstructive sleep apnea   . History of medication noncompliance   . Alcohol abuse   . Shortness of breath   . Migraine   . Pulmonary embolism   . CHF (congestive heart failure)    Past Surgical History  Procedure Laterality Date  . Testicle surgery    . Cardiac catheterization     Family History  Problem Relation Age of Onset  . Hypertension      Family History  . Stroke      Family History  . Diabetes      Family History  . Cancer Mother     brain tumor  . Hypertension Mother   . Diabetes Father     Deceased, 18  . Heart disease Maternal Grandmother   . Diabetes Daughter    Social History  Substance Use Topics  . Smoking status: Current Every Day Smoker -- 0.50 packs/day for 30 years    Types: Cigarettes  . Smokeless tobacco: Never Used     Comment: 10/2014- using patches  . Alcohol Use: 0.0 oz/week    0 Standard drinks or equivalent per week     Comment: nothing to drink since last OV with Dr. Harl Bowie per patient    Review of Systems  Respiratory: Positive for chest tightness and shortness of breath.   Cardiovascular: Positive for chest pain.  Gastrointestinal: Positive for  abdominal pain.  Neurological: Positive for dizziness.  All other systems reviewed and are negative.  Allergies  Ace inhibitors and Buspirone  Home Medications   Prior to Admission medications   Medication Sig Start Date End Date Taking? Authorizing Provider  clonazePAM (KLONOPIN) 1 MG tablet TAKE 1 TABLET BY MOUTH 3 TIMES DAILY AS NEEDED 06/22/15   Alycia Rossetti, MD  losartan (COZAAR) 50 MG tablet Take 1.5 tablets (75 mg total) by mouth every evening. 03/26/15   [name], MD  metoprolol succinate (TOPROL-XL) 100 MG 24 hr tablet TAKE 1 TABLET BY MOUTH DAILY WITH OR IMMEDIATELY FOLLOWING A MEAL 07/20/15   Alycia Rossetti, MD  NON FORMULARY Bipap, pressure 16/12 with 2L of O2    Historical Provider, MD  nortriptyline (PAMELOR) 10 MG capsule Take 1 tablet at bedtime for 2 weeks,  then increase to 2 tablets daily. 05/03/15   Donika K Patel, DO  pantoprazole (PROTONIX) 40 MG tablet TAKE 1 TABLET (40 MG TOTAL) BY MOUTH DAILY. 05/02/15   Alycia Rossetti, MD  spironolactone (ALDACTONE) 25 MG tablet TAKE 1 TABLET BY MOUTH DAILY 10/18/14   Alycia Rossetti, MD  thiamine (VITAMIN B-1) 100 MG tablet Take 100 mg by mouth daily.    Historical Provider, MD  torsemide (DEMADEX) 20 MG tablet Take 1 tablet (20 mg total) by mouth daily. 12/31/14   Isaiah Serge, NP  XARELTO 20 MG TABS tablet TAKE 1 TABLET BY MOUTH DAILY WITH SUPPER 03/09/15   Alycia Rossetti, MD   BP 134/98 mmHg  Pulse 69  Temp(Src) 97.6 F (36.4 C) (Oral)  Resp 24  Ht 5\' 8"  (1.727 m)  Wt 429 lb (194.593 kg)  BMI 65.24 kg/m2  SpO2 100%  Vital signs normal   Physical Exam  Constitutional: He is oriented to person, place, and time. He appears well-developed and well-nourished.  Non-toxic appearance. He does not appear ill. No distress.  Morbidly obese  HENT:  Head: Normocephalic and atraumatic.  Right Ear: External ear normal.  Left Ear: External ear normal.  Nose: Nose normal. No mucosal edema or rhinorrhea.  Mouth/Throat: Oropharynx is clear and moist and mucous membranes are normal. No dental abscesses or uvula swelling.  Eyes: Conjunctivae and EOM are normal. Pupils are equal, round, and reactive to light.  Dysconjugate; left eye deviates laterally  Neck: Normal range of motion and full passive range of motion without pain. Neck supple.  Cardiovascular: Normal rate and regular rhythm.  Exam reveals no gallop and no friction rub.   No murmur heard. Heart tones hard to hear because of chest wall thickness  Pulmonary/Chest: Effort normal. No respiratory distress. He has no wheezes. He has no rhonchi. He has no rales. He exhibits no tenderness and no crepitus.  Breath sounds hard to hear due to chest wall thickness  Abdominal: Soft. Normal appearance and bowel sounds are normal. He exhibits no distension. There  is no tenderness. There is no rebound and no guarding.  Musculoskeletal: Normal range of motion. He exhibits no edema or tenderness.  Moves all extremities well.   Neurological: He is alert and oriented to person, place, and time. He has normal strength. No cranial nerve deficit.  Skin: Skin is warm, dry and intact. No rash noted. No erythema. No pallor.  Psychiatric: He has a normal mood and affect. His speech is normal and behavior is normal. His mood appears not anxious.  Nursing note and vitals reviewed.  ED Course  Procedures  Medications  nitroGLYCERIN (NITROGLYN) 2 % ointment 1 inch (1 inch Topical Given 07/28/15 0039)  aspirin chewable tablet 324 mg (324 mg Oral Given 07/28/15 0038)  iohexol (OMNIPAQUE) 350 MG/ML injection 100 mL (100 mLs Intravenous Contrast Given 07/28/15 0019)    DIAGNOSTIC STUDIES: Oxygen Saturation is 100% on RA, normal by my interpretation.    COORDINATION OF CARE: 11:25 PM Discussed treatment plan with pt at bedside and pt agreed to plan. We discussed getting a second troponin to see if his elevation of his troponin is stable or if it is rising. He was given nitroglycerin ointment and an aspirin to chew.  Patient was given his second troponin result. I told him I was going to talk to the cardiologist and decide what we need to do. He denies any more chest pain while in the ED. He c/o a left sided abdominal pain.   1:49 AM after reviewing patient's stable second troponin he was discussed with Dr.End, cardiology on-call at [facility]. We reviewed his echocardiogram which was done in March. He had a ejection fraction of 25-30%. He had severe systolic dysfunction and a grade 2 diastolic dysfunction. He also had a normal cardiac cath in 2012. Dr.End felt the patient should be admitted. There is no cardiology service here until August 29. He felt the hospitalists could admit him at [facility] and consult cardiology as needed.  Patient was given the recommendation by  cardiologist. He states he lives in [place] and he just happened to be driving up here with his son. He states he does not want to be transported by ambulance. He states "I will just go to [facility] myself". He was advised that he should be transported by ambulance. However he is insistent that he is going to leave AMA and drive himself to [place].  Labs Review Results for orders placed or performed during the hospital encounter of 70/62/37  Basic metabolic panel  Result Value Ref Range   Sodium 140 135 - 145 mmol/L   Potassium 3.5 3.5 - 5.1 mmol/L   Chloride 106 101 - 111 mmol/L   CO2 27 22 - 32 mmol/L   Glucose, Bld 122 (H) 65 - 99 mg/dL   BUN 16 6 - 20 mg/dL   Creatinine, Ser 1.01 0.61 - 1.24 mg/dL   Calcium 9.2 8.9 - 10.3 mg/dL   GFR calc non Af Amer >60 >60 mL/min   GFR calc Af Amer >60 >60 mL/min   Anion gap 7 5 - 15  CBC  Result Value Ref Range   WBC 9.4 4.0 - 10.5 K/uL   RBC 4.92 4.22 - 5.81 MIL/uL   Hemoglobin 14.3 13.0 - 17.0 g/dL   HCT 40.7 39.0 - 52.0 %   MCV 82.7 78.0 - 100.0 fL   MCH 29.1 26.0 - 34.0 pg   MCHC 35.1 30.0 - 36.0 g/dL   RDW 14.1 11.5 - 15.5 %   Platelets 233 150 - 400 K/uL  Troponin I  Result Value Ref Range   Troponin I 0.07 (H) <0.031 ng/mL  Troponin I  Result Value Ref Range   Troponin I 0.06 (H) <0.031 ng/mL  Brain natriuretic peptide  Result Value Ref Range   B Natriuretic Peptide 43.0 0.0 - 100.0 pg/mL  Lipase, blood  Result Value Ref Range   Lipase 28 22 - 51 U/L   Laboratory interpretation all normal except stable + delta troponins  Imaging Review Ct Angio Chest Pe W/cm &/or Wo Cm  07/28/2015  CLINICAL DATA:  Shortness of breath and chest pain today. History of pulmonary embolus.  EXAM: CT ANGIOGRAPHY CHEST WITH CONTRAST  TECHNIQUE: Multidetector CT imaging of the chest was performed using the standard protocol during bolus administration of intravenous contrast. Multiplanar CT image reconstructions and MIPs were obtained to evaluate  the vascular anatomy.  CONTRAST:  122mL OMNIPAQUE IOHEXOL 350 MG/ML SOLN  COMPARISON:  Ventilation perfusion scan 04/26/2014. No prior chest CT.  FINDINGS: No filling defects in the central pulmonary arteries to suggest pulmonary embolus. Detailed evaluation of the distal segmental, subsegmental branches particularly in the lower lobes is limited due to soft tissue attenuation from body habitus and breathing motion artifact.  Cardiomegaly with left ventricular hypertrophy no pericardial effusion. The thoracic aorta is normal in caliber. There is no pleural effusion. No mediastinal adenopathy.  No confluent airspace disease. No pulmonary mass. Detailed parenchymal evaluation of the lungs is limited by soft tissue attenuation.  No definite acute abnormality in the included upper abdomen.  Degenerative change throughout the thoracic spine.  Review of the MIP images confirms the above findings.  IMPRESSION: 1. No central pulmonary embolus. 2. Cardiomegaly with left ventricular hypertrophy.   Electronically Signed   By: Jeb Levering M.D.   On: 07/28/2015 00:46   Dg Chest Port 1 View  07/27/2015   CLINICAL DATA:  Chest pain and shortness of breath since 2 p.m. History of CHF, atrial fibrillation, PE.  EXAM: PORTABLE CHEST - 1 VIEW  COMPARISON:  12/27/2014  FINDINGS: Shallow inspiration. Atelectasis in the lung bases. Borderline heart size with normal pulmonary vascularity. Mediastinal contours appear intact. No focal consolidation in the lungs. No pneumothorax.  IMPRESSION: Shallow inspiration with atelectasis in the lung bases. Mild cardiac enlargement.   Electronically Signed   By: Lucienne Capers M.D.   On: 07/27/2015 22:08    I have personally reviewed and evaluated these images and lab results as part of my medical decision-making.   EKG Interpretation   Date/Time:  Friday July 27 2015 20:52:20 EDT Ventricular Rate:  67 PR Interval:  170 QRS Duration: 116 QT Interval:  404 QTC Calculation:  426 R Axis:   61 Text Interpretation:  Normal sinus rhythm Nonspecific T wave abnormality  Abnormal ECG No significant change since last tracing Confirmed by  ZACKOWSKI  MD, SCOTT ([id number]) on 07/27/2015 9:04:11 PM      MDM   Final diagnoses:  Chest pain    Pt left AMA  Adam Porter, MD, FACEP   I personally performed the services described in this documentation, which was scribed in my presence. The recorded information has been reviewed and considered.  Adam Porter, MD, Barbette Or, MD 07/28/15 [phone]

## 2015-07-28 DIAGNOSIS — I517 Cardiomegaly: Secondary | ICD-10-CM | POA: Diagnosis not present

## 2015-07-28 DIAGNOSIS — R0602 Shortness of breath: Secondary | ICD-10-CM | POA: Diagnosis not present

## 2015-07-28 DIAGNOSIS — Z86711 Personal history of pulmonary embolism: Secondary | ICD-10-CM | POA: Diagnosis not present

## 2015-07-28 LAB — LIPASE, BLOOD: LIPASE: 28 U/L (ref 22–51)

## 2015-07-28 LAB — BRAIN NATRIURETIC PEPTIDE: B NATRIURETIC PEPTIDE 5: 43 pg/mL (ref 0.0–100.0)

## 2015-07-28 LAB — TROPONIN I: TROPONIN I: 0.06 ng/mL — AB (ref <0.031)

## 2015-07-28 NOTE — ED Notes (Addendum)
Patient is refusing to be transferred by ambulance to [place] he wants to sign out AMA and drive to [facility] himself. States "I'm a single parent and I need to get my son somewhere before I go." Patient's son at bedside. Advised patient of risks of leaving AMA. Patient verbalized understanding.

## 2015-07-30 ENCOUNTER — Ambulatory Visit (HOSPITAL_COMMUNITY): Payer: Medicare Other

## 2015-08-01 ENCOUNTER — Encounter (HOSPITAL_COMMUNITY): Payer: Self-pay | Admitting: *Deleted

## 2015-08-01 ENCOUNTER — Encounter (HOSPITAL_COMMUNITY): Payer: Self-pay

## 2015-08-01 ENCOUNTER — Ambulatory Visit (HOSPITAL_COMMUNITY): Payer: Medicare Other

## 2015-08-01 ENCOUNTER — Ambulatory Visit (HOSPITAL_COMMUNITY)
Admission: RE | Admit: 2015-08-01 | Discharge: 2015-08-01 | Disposition: A | Discharge status: Home / Self Care | Payer: Medicare Other | Source: Ambulatory Visit | Attending: Cardiology | Admitting: Cardiology

## 2015-08-01 ENCOUNTER — Telehealth: Payer: Self-pay | Admitting: Pulmonary Disease

## 2015-08-01 ENCOUNTER — Other Ambulatory Visit: Payer: Self-pay | Admitting: *Deleted

## 2015-08-01 VITALS — BP 124/64 | HR 84 | Wt >= 6400 oz

## 2015-08-01 DIAGNOSIS — I429 Cardiomyopathy, unspecified: Secondary | ICD-10-CM | POA: Diagnosis not present

## 2015-08-01 DIAGNOSIS — G4733 Obstructive sleep apnea (adult) (pediatric): Secondary | ICD-10-CM | POA: Insufficient documentation

## 2015-08-01 DIAGNOSIS — I5022 Chronic systolic (congestive) heart failure: Secondary | ICD-10-CM | POA: Insufficient documentation

## 2015-08-01 DIAGNOSIS — G629 Polyneuropathy, unspecified: Secondary | ICD-10-CM | POA: Diagnosis not present

## 2015-08-01 DIAGNOSIS — Z720 Tobacco use: Secondary | ICD-10-CM

## 2015-08-01 DIAGNOSIS — I10 Essential (primary) hypertension: Secondary | ICD-10-CM | POA: Diagnosis not present

## 2015-08-01 DIAGNOSIS — F17210 Nicotine dependence, cigarettes, uncomplicated: Secondary | ICD-10-CM | POA: Insufficient documentation

## 2015-08-01 DIAGNOSIS — R5383 Other fatigue: Secondary | ICD-10-CM | POA: Insufficient documentation

## 2015-08-01 DIAGNOSIS — F410 Panic disorder [episodic paroxysmal anxiety] without agoraphobia: Secondary | ICD-10-CM | POA: Insufficient documentation

## 2015-08-01 DIAGNOSIS — R079 Chest pain, unspecified: Secondary | ICD-10-CM

## 2015-08-01 MED ORDER — ESCITALOPRAM OXALATE 10 MG PO TABS: 10.0000 mg | ORAL_TABLET | Freq: Every day | ORAL | Status: DC

## 2015-08-01 MED ORDER — POTASSIUM CHLORIDE CRYS ER 20 MEQ PO TBCR: 20.0000 meq | EXTENDED_RELEASE_TABLET | Freq: Every day | ORAL | Status: DC

## 2015-08-01 MED ORDER — MAGNESIUM OXIDE -MG SUPPLEMENT 200 MG PO TABS: 200.0000 mg | ORAL_TABLET | Freq: Every day | ORAL | Status: DC

## 2015-08-01 MED ORDER — ASPIRIN EC 81 MG PO TBEC: 81.0000 mg | DELAYED_RELEASE_TABLET | Freq: Every day | ORAL | Status: DC

## 2015-08-01 MED ORDER — TORSEMIDE 20 MG PO TABS: 40.0000 mg | ORAL_TABLET | Freq: Every day | ORAL | Status: DC

## 2015-08-01 NOTE — Patient Instructions (Addendum)
Increase Torsemide to 40 mg (2 tabs) daily  Start Potassium 20 meq daily  Start mag-ox 200 mg daily  Start Lexapro 10 mg daily  Start Aspirin 81 mg daily  Your physician has requested that you have an echocardiogram. Echocardiography is a painless test that uses sound waves to create images of your heart. It provides your doctor with information about the size and shape of your heart and how well your heart's chambers and valves are working. This procedure takes approximately one hour. There are no restrictions for this procedure.  Heart Catheterization is scheduled for 9/8, see instruction sheet  Your physician recommends that you schedule a follow-up appointment in: 3 weeks

## 2015-08-01 NOTE — Telephone Encounter (Signed)
Called and spoke with pt Pt stated that he is unhappy with the care that he has received at [facility] and wishes to switch all of his BIPAP supplies to [facility] Informed pt that since Dr Gwenette Greet is not with practice that message would be routed to another physician in office Pt ok with that  Pt last seen in office 12/2014 with Dr Gwenette Greet writing for yearly f/u appts  Dr Halford Chessman, are you ok with order for pt's BIPAP supplies being sent under your order?  Please advise, thank you

## 2015-08-01 NOTE — Progress Notes (Addendum)
Advanced Heart Failure Medication Review by a Pharmacist  Does the patient  feel that his/her medications are working for him/her?  yes  Has the patient been experiencing any side effects to the medications prescribed?  no  Does the patient measure his/her own blood pressure or blood glucose at home?  no   Does the patient have any problems obtaining medications due to transportation or finances?   no  Understanding of regimen: good Understanding of indications: good Potential of compliance: fair    Pharmacist comments:  [name] is a 49 yo M presenting without a medication list. He is complaining of increased fatigue and panic attacks. He does state that he ran out of his Lexapro about a month ago so it was discontinued abruptly. I encouraged him to make an appointment with the MD who initially prescribed his Lexapro and clonazepam. He also reports not taking his Xarelto x about one month since he states he no longer has a clot. He states good compliance with his medications and takes them daily in the early afternoon. All of his medications were reconciled.   [name], PharmD, BCPS, CPP Clinical Pharmacist Pager: [phone] Phone: [phone] 08/01/2015 10:36 AM

## 2015-08-01 NOTE — Progress Notes (Signed)
Patient ID: [name], male   DOB: 02/26/1966, 49 y.o.   MRN: [id number] PCP: Dr. Buelah Manis  49 yo with history of nonischemic cardiomyopathy presents for cardiology followup.  Echo in 5/15 showed EF 30-35%.  The cardiomyopathy may be due to prior ETOH abuse, he now has quit.  Past cath showed no significant coronary disease.  He is morbidly obese.  He is now smoking 1 cigarette/day.  He was on Xarelto for a PE in 5/15 for about a year and is now off. Repeat echo in 3/16 showed EF 25-30%.   [name] saw urology for hematuria, says that he has not been given any specific diagnosis.  Now off Xarelto. He has not been doing well generally.  Severe fatigue.  He has been using his Bipap with oxygen at night. He is more short of breath than in the past, now just with walking around the house.  No lightheadedness.  +Orthopnea, no PND.  Weight is up 6 lbs.  He went to the ER on 07/27/15 after developed upper abdominal and chest pain.  Troponin was mildly elevated at 0.06, BNP was not elevated.   CTA chest showed no PE.  Admission was recommended, but he left AMA.  He has had no further chest pain.    Labs (5/13): K 4.1, creatinine 1.05 Labs (1/14): K 3.8, creatinine 1.16, BNP 54 Labs (2/14): K 3.7, creatinine 1.11, BNP 28 Labs (2/16): K 4.1, creatinine 1.03, LDL 96, HCT 40 Labs (3/16): K 3.7, K 1.13, BNP 367 Labs (8/16): BNP 43, K 3.5, creatinine 1.01  ECG: NSR, narrow QRS, nonspecific T wave changes  PMH: 1. Nonischemic cardiomyopathy: Prior cath with no significant CAD.  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  Prior echoes with EF as low as 25%.  Echo (9/13) with EF 35-40%, moderate to severe LV dilation, diffuse hypokinesis, mild Adam. Echo (5/15) with EF 30-35%, moderate to severe LAE, normal RV size and systolic function.  Angioedema with ACEI, headaches with hydralazine/nitrates. Echo (3/16) with EF 25-30%, severe LV dilation, normal RV size and systolic  function.  2. HTN: angioedema with ACEI.  3. OSA: on Bipap 4. Morbid obesity 5. Paroxysmal atrial fibrillation: Not documented recently.   6. Smoker.  7. Anxiety/panic attacks 8. PE: 5/15, diagnosed by V/Q scan. CTA chest 8/16 negative for PE.  9. NSVT 10. Hematuria: Apparently had negative workup by urology.  11. ABIs (6/16) were normal 12. 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   SH: On disability, lives in [place], prior ETOH abuse but now dose not drink, no drugs, smokes 1 cig/day.  Has son and daughter.    FH: No premature CAD.    ROS: All systems reviewed and negative except as per HPI.   BP 124/64 mmHg  Pulse 84  Wt 426 lb (193.232 kg)  SpO2 100% General: NAD, obese.  Neck: Thick, JVP 8 cm, no thyromegaly or thyroid nodule.  Lungs: Clear to auscultation bilaterally with normal respiratory effort. CV: Nondisplaced PMI.  Heart regular S1/S2, no S3/S4, no murmur.  1+ ankle edema.  No carotid bruit.  Normal pedal pulses.  Abdomen: Soft, nontender, unable to palpate liver edge, mild distention.  Neurologic: Alert and oriented x 3.  Psych: Normal affect. Extremities: No clubbing or cyanosis.   Assessment/Plan: 1. Chronic systolic CHF: He is volume overloaded on exam and more symptomatic, NYHA class III.  Echo (3/16) with EF 25-30%, severely dilated LV.   - We  have discussed ICD implantation.  He would not be CRT candidate.  He has been ambivalent about ICD in the past.  I am going to repeat echo in 9/16.  If EF remains low, he is willing to get ICD.  - Continue current Toprol XL and spironolactone.  - Continue losartan 50 mg daily.  He had angioedema with ACEI, but the risk of angioedema in this situation with ARB use is low.  - Increase torsemide to 40 mg dailiy with KCl 20 daily.  BMET 2 wks.   - Continue to avoid ETOH.  - Given chest pain and mildly elevated troponin in the ER in 8/16 as well as volume overload on exam and profound fatigue, I will arrange for RHC/LHC to  rule out coronary disease, assess filling pressures, and assess cardiac output.  We discussed risks/benefits today and he agrees to proceed with the test.  He will take ASA 81 until we rule out CAD.  2. Obesity: Needs to continue to work on diet/exercise for weight loss.  3. Smoking: Still smoking but much less than in the past.  I strongly encouraged him to quit.  4. Anxiety/Panic: This is still an issue for him. I will refill his Lexapro. 5. Paroxysmal atrial fibrillation: Remote.  No recent atrial fibrillation noted.  He is no longer anticoagulated.  6. PE: 5/15, diagnosed by V/Q scan.  He was on Xarelto for about a year but stopped not long ago in the setting of hematuria.  CTA chest in 8/16 did not show a PE.   Followup 2 wks after cath.    [name] 08/01/2015

## 2015-08-02 NOTE — Telephone Encounter (Signed)
Called spoke with pt. Aware order placed. Nothing further needed 

## 2015-08-02 NOTE — Telephone Encounter (Signed)
Okay to send order to change DME to [facility] for BiPAP supplies.

## 2015-08-03 ENCOUNTER — Ambulatory Visit (HOSPITAL_COMMUNITY): Payer: Medicare Other

## 2015-08-08 ENCOUNTER — Ambulatory Visit (HOSPITAL_COMMUNITY): Payer: Medicare Other

## 2015-08-09 ENCOUNTER — Ambulatory Visit (HOSPITAL_COMMUNITY): Admission: RE | Admit: 2015-08-09 | Payer: Medicare Other | Source: Ambulatory Visit | Admitting: Cardiology

## 2015-08-09 ENCOUNTER — Telehealth (HOSPITAL_COMMUNITY): Payer: Self-pay | Admitting: *Deleted

## 2015-08-09 ENCOUNTER — Encounter (HOSPITAL_COMMUNITY): Admission: RE | Payer: Self-pay | Source: Ambulatory Visit

## 2015-08-09 SURGERY — RIGHT/LEFT HEART CATH AND CORONARY ANGIOGRAPHY

## 2015-08-09 NOTE — Telephone Encounter (Signed)
Spoke with pt and confirmed his L&R Heart cath 08/13/15 at 9am pt to arrive at main entrance at 7am

## 2015-08-10 ENCOUNTER — Ambulatory Visit (HOSPITAL_COMMUNITY): Payer: Medicare Other

## 2015-08-10 ENCOUNTER — Telehealth: Payer: Self-pay | Admitting: Pulmonary Disease

## 2015-08-10 DIAGNOSIS — G4733 Obstructive sleep apnea (adult) (pediatric): Secondary | ICD-10-CM

## 2015-08-10 NOTE — Telephone Encounter (Signed)
On 08/01/15 VS okay'd order for pt to switch to [facility] for bipap and supplies Called pt and he is now also wanting to change his O2 to them as well. I have placed order. Nothing further needed

## 2015-08-10 NOTE — Telephone Encounter (Signed)
Addressed at most recent office visit with CHF clinic Patient scheduled for L and R heart cath Cpt code [phone] With patients current insurance- medicaid/medicare [employer]

## 2015-08-13 ENCOUNTER — Ambulatory Visit (HOSPITAL_COMMUNITY): Payer: Medicare Other

## 2015-08-13 ENCOUNTER — Ambulatory Visit (HOSPITAL_COMMUNITY)
Admission: RE | Admit: 2015-08-13 | Discharge: 2015-08-13 | Disposition: A | Discharge status: Home / Self Care | Payer: Medicare Other | Source: Ambulatory Visit | Attending: Cardiology | Admitting: Cardiology

## 2015-08-13 ENCOUNTER — Encounter (HOSPITAL_COMMUNITY)
Admission: RE | Disposition: A | Discharge status: Home / Self Care | Payer: Self-pay | Source: Ambulatory Visit | Attending: Cardiology

## 2015-08-13 DIAGNOSIS — Z6841 Body Mass Index (BMI) 40.0 and over, adult: Secondary | ICD-10-CM | POA: Diagnosis not present

## 2015-08-13 DIAGNOSIS — R0789 Other chest pain: Secondary | ICD-10-CM | POA: Diagnosis not present

## 2015-08-13 DIAGNOSIS — Z7901 Long term (current) use of anticoagulants: Secondary | ICD-10-CM | POA: Insufficient documentation

## 2015-08-13 DIAGNOSIS — I480 Paroxysmal atrial fibrillation: Secondary | ICD-10-CM | POA: Insufficient documentation

## 2015-08-13 DIAGNOSIS — I429 Cardiomyopathy, unspecified: Secondary | ICD-10-CM | POA: Insufficient documentation

## 2015-08-13 DIAGNOSIS — I10 Essential (primary) hypertension: Secondary | ICD-10-CM | POA: Diagnosis not present

## 2015-08-13 DIAGNOSIS — F17210 Nicotine dependence, cigarettes, uncomplicated: Secondary | ICD-10-CM | POA: Insufficient documentation

## 2015-08-13 DIAGNOSIS — I5022 Chronic systolic (congestive) heart failure: Secondary | ICD-10-CM | POA: Insufficient documentation

## 2015-08-13 DIAGNOSIS — I2782 Chronic pulmonary embolism: Secondary | ICD-10-CM | POA: Diagnosis not present

## 2015-08-13 DIAGNOSIS — G4733 Obstructive sleep apnea (adult) (pediatric): Secondary | ICD-10-CM | POA: Diagnosis not present

## 2015-08-13 DIAGNOSIS — R079 Chest pain, unspecified: Secondary | ICD-10-CM | POA: Diagnosis not present

## 2015-08-13 HISTORY — PX: CARDIAC CATHETERIZATION: SHX172

## 2015-08-13 LAB — POCT I-STAT 3, VENOUS BLOOD GAS (G3P V)
BICARBONATE: 24.9 meq/L — AB (ref 20.0–24.0)
BICARBONATE: 25.9 meq/L — AB (ref 20.0–24.0)
O2 SAT: 62 %
O2 Saturation: 60 %
PCO2 VEN: 41.6 mmHg — AB (ref 45.0–50.0)
PH VEN: 7.38 — AB (ref 7.250–7.300)
PH VEN: 7.386 — AB (ref 7.250–7.300)
PO2 VEN: 32 mmHg (ref 30.0–45.0)
TCO2: 26 mmol/L (ref 0–100)
TCO2: 27 mmol/L (ref 0–100)
pCO2, Ven: 43.9 mmHg — ABNORMAL LOW (ref 45.0–50.0)
pO2, Ven: 33 mmHg (ref 30.0–45.0)

## 2015-08-13 LAB — CBC
HEMATOCRIT: 41.3 % (ref 39.0–52.0)
Hemoglobin: 14.5 g/dL (ref 13.0–17.0)
MCH: 29.2 pg (ref 26.0–34.0)
MCHC: 35.1 g/dL (ref 30.0–36.0)
MCV: 83.1 fL (ref 78.0–100.0)
PLATELETS: 221 10*3/uL (ref 150–400)
RBC: 4.97 MIL/uL (ref 4.22–5.81)
RDW: 14.2 % (ref 11.5–15.5)
WBC: 9.5 10*3/uL (ref 4.0–10.5)

## 2015-08-13 LAB — BASIC METABOLIC PANEL
Anion gap: 9 (ref 5–15)
BUN: 18 mg/dL (ref 6–20)
CHLORIDE: 105 mmol/L (ref 101–111)
CO2: 24 mmol/L (ref 22–32)
CREATININE: 1.18 mg/dL (ref 0.61–1.24)
Calcium: 8.6 mg/dL — ABNORMAL LOW (ref 8.9–10.3)
GFR calc Af Amer: >60 mL/min (ref >60)
GFR calc non Af Amer: >60 mL/min (ref >60)
Glucose, Bld: 109 mg/dL — ABNORMAL HIGH (ref 65–99)
POTASSIUM: 3.7 mmol/L (ref 3.5–5.1)
Sodium: 138 mmol/L (ref 135–145)

## 2015-08-13 LAB — PROTIME-INR
INR: 1.06 (ref 0.00–1.49)
Prothrombin Time: 14 seconds (ref 11.6–15.2)

## 2015-08-13 LAB — POCT ACTIVATED CLOTTING TIME: Activated Clotting Time: 146 seconds

## 2015-08-13 SURGERY — RIGHT/LEFT HEART CATH AND CORONARY ANGIOGRAPHY
Anesthesia: LOCAL

## 2015-08-13 MED ORDER — VERAPAMIL HCL 2.5 MG/ML IV SOLN
INTRAVENOUS | Status: AC
  Filled 2015-08-13: qty 2

## 2015-08-13 MED ORDER — IOHEXOL 350 MG/ML SOLN
INTRAVENOUS | Status: DC | PRN
  Administered 2015-08-13: 100 mL via INTRA_ARTERIAL

## 2015-08-13 MED ORDER — SODIUM CHLORIDE 0.9 % IV SOLN: INTRAVENOUS | Status: DC

## 2015-08-13 MED ORDER — HYDROMORPHONE HCL 1 MG/ML IJ SOLN
INTRAMUSCULAR | Status: AC
  Filled 2015-08-13: qty 1

## 2015-08-13 MED ORDER — SODIUM CHLORIDE 0.9 % IV SOLN: 250.0000 mL | INTRAVENOUS | Status: DC | PRN

## 2015-08-13 MED ORDER — FENTANYL CITRATE (PF) 100 MCG/2ML IJ SOLN
INTRAMUSCULAR | Status: AC
  Filled 2015-08-13: qty 4

## 2015-08-13 MED ORDER — MIDAZOLAM HCL 2 MG/2ML IJ SOLN
INTRAMUSCULAR | Status: DC | PRN
  Administered 2015-08-13 (×8): 1 mg via INTRAVENOUS

## 2015-08-13 MED ORDER — VERAPAMIL HCL 2.5 MG/ML IV SOLN
INTRAVENOUS | Status: DC | PRN
  Administered 2015-08-13 (×3): via INTRA_ARTERIAL

## 2015-08-13 MED ORDER — MIDAZOLAM HCL 2 MG/2ML IJ SOLN
INTRAMUSCULAR | Status: AC
  Filled 2015-08-13: qty 4

## 2015-08-13 MED ORDER — SODIUM CHLORIDE 0.9 % IJ SOLN: 3.0000 mL | Freq: Two times a day (BID) | INTRAMUSCULAR | Status: DC

## 2015-08-13 MED ORDER — HYDROMORPHONE HCL 1 MG/ML IJ SOLN
INTRAMUSCULAR | Status: DC | PRN
  Administered 2015-08-13 (×3): 0.5 mg via INTRAVENOUS

## 2015-08-13 MED ORDER — SODIUM CHLORIDE 0.9 % WEIGHT BASED INFUSION: 1.0000 mL/kg/h | INTRAVENOUS | Status: AC

## 2015-08-13 MED ORDER — ONDANSETRON HCL 4 MG/2ML IJ SOLN: 4.0000 mg | Freq: Four times a day (QID) | INTRAMUSCULAR | Status: DC | PRN

## 2015-08-13 MED ORDER — ACETAMINOPHEN 325 MG PO TABS: 650.0000 mg | ORAL_TABLET | ORAL | Status: DC | PRN

## 2015-08-13 MED ORDER — HEPARIN (PORCINE) IN NACL 2-0.9 UNIT/ML-% IJ SOLN
INTRAMUSCULAR | Status: AC
  Filled 2015-08-13: qty 1000

## 2015-08-13 MED ORDER — HEPARIN SODIUM (PORCINE) 1000 UNIT/ML IJ SOLN
INTRAMUSCULAR | Status: DC | PRN
  Administered 2015-08-13: 6000 [IU] via INTRAVENOUS

## 2015-08-13 MED ORDER — SODIUM CHLORIDE 0.9 % IJ SOLN: 3.0000 mL | INTRAMUSCULAR | Status: DC | PRN

## 2015-08-13 MED ORDER — LIDOCAINE HCL (PF) 1 % IJ SOLN
INTRAMUSCULAR | Status: AC
  Filled 2015-08-13: qty 30

## 2015-08-13 MED ORDER — FENTANYL CITRATE (PF) 100 MCG/2ML IJ SOLN
INTRAMUSCULAR | Status: DC | PRN
  Administered 2015-08-13 (×5): 25 ug via INTRAVENOUS

## 2015-08-13 MED ORDER — NITROGLYCERIN 1 MG/10 ML FOR IR/CATH LAB
INTRA_ARTERIAL | Status: AC
  Filled 2015-08-13: qty 10

## 2015-08-13 MED ORDER — NITROGLYCERIN 1 MG/10 ML FOR IR/CATH LAB
INTRA_ARTERIAL | Status: DC | PRN
  Administered 2015-08-13 (×3): 200 ug via INTRA_ARTERIAL

## 2015-08-13 MED ORDER — HEPARIN SODIUM (PORCINE) 1000 UNIT/ML IJ SOLN
INTRAMUSCULAR | Status: AC
  Filled 2015-08-13: qty 1

## 2015-08-13 MED ORDER — LIDOCAINE HCL (PF) 1 % IJ SOLN
INTRAMUSCULAR | Status: DC | PRN
  Administered 2015-08-13: 11:00:00

## 2015-08-13 SURGICAL SUPPLY — 18 items
CATH BALLN WEDGE 5F 110CM (CATHETERS) ×2 IMPLANT
CATH INFINITI 5 FR JL3.5 (CATHETERS) ×2 IMPLANT
CATH INFINITI 5FR ANG PIGTAIL (CATHETERS) ×2 IMPLANT
CATH INFINITI 5FR JL4 (CATHETERS) ×2 IMPLANT
CATH INFINITI JR4 5F (CATHETERS) ×2 IMPLANT
DEVICE RAD COMP TR BAND LRG (VASCULAR PRODUCTS) ×2 IMPLANT
GLIDESHEATH SLEND SS 6F .021 (SHEATH) ×2 IMPLANT
KIT HEART LEFT (KITS) ×2 IMPLANT
KIT HEART RIGHT NAMIC (KITS) ×2 IMPLANT
PACK CARDIAC CATHETERIZATION (CUSTOM PROCEDURE TRAY) ×2 IMPLANT
SHEATH FAST CATH BRACH 5F 5CM (SHEATH) ×2 IMPLANT
SHEATH PINNACLE 5F 10CM (SHEATH) ×2 IMPLANT
SYR MEDRAD MARK V 150ML (SYRINGE) ×2 IMPLANT
TRANSDUCER W/STOPCOCK (MISCELLANEOUS) ×4 IMPLANT
TUBING CIL FLEX 10 FLL-RA (TUBING) ×2 IMPLANT
WIRE EMERALD 3MM-J .035X150CM (WIRE) ×2 IMPLANT
WIRE HI TORQ VERSACORE-J 145CM (WIRE) ×2 IMPLANT
WIRE SAFE-T 1.5MM-J .035X260CM (WIRE) ×2 IMPLANT

## 2015-08-13 NOTE — Progress Notes (Signed)
Site area: rt ac venous sheath Site Prior to Removal:  Level 0 Pressure Applied For:  10 minutes Manual:   yes Patient Status During Pull:  stable Post Pull Site:  Level  0 Post Pull Instructions Given:  yes Post Pull Pulses Present: yes Dressing Applied:  Small tegaderm Bedrest begins @  Comments:   

## 2015-08-13 NOTE — Progress Notes (Signed)
Rt groin site level 0

## 2015-08-13 NOTE — H&P (View-Only) (Signed)
Patient ID: [name], male   DOB: Jan 18, 1966, 49 y.o.   MRN: [id number] PCP: Dr. Buelah Manis  49 yo with history of nonischemic cardiomyopathy presents for cardiology followup.  Echo in 5/15 showed EF 30-35%.  The cardiomyopathy may be due to prior ETOH abuse, he now has quit.  Past cath showed no significant coronary disease.  He is morbidly obese.  He is now smoking 1 cigarette/day.  He was on Xarelto for a PE in 5/15 for about a year and is now off. Repeat echo in 3/16 showed EF 25-30%.   [name] saw urology for hematuria, says that he has not been given any specific diagnosis.  Now off Xarelto. He has not been doing well generally.  Severe fatigue.  He has been using his Bipap with oxygen at night. He is more short of breath than in the past, now just with walking around the house.  No lightheadedness.  +Orthopnea, no PND.  Weight is up 6 lbs.  He went to the ER on 07/27/15 after developed upper abdominal and chest pain.  Troponin was mildly elevated at 0.06, BNP was not elevated.   CTA chest showed no PE.  Admission was recommended, but he left AMA.  He has had no further chest pain.    Labs (5/13): K 4.1, creatinine 1.05 Labs (1/14): K 3.8, creatinine 1.16, BNP 54 Labs (2/14): K 3.7, creatinine 1.11, BNP 28 Labs (2/16): K 4.1, creatinine 1.03, LDL 96, HCT 40 Labs (3/16): K 3.7, K 1.13, BNP 367 Labs (8/16): BNP 43, K 3.5, creatinine 1.01  ECG: NSR, narrow QRS, nonspecific T wave changes  PMH: 1. Nonischemic cardiomyopathy: Prior cath with no significant CAD.  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  Prior echoes with EF as low as 25%.  Echo (9/13) with EF 35-40%, moderate to severe LV dilation, diffuse hypokinesis, mild Adam. Echo (5/15) with EF 30-35%, moderate to severe LAE, normal RV size and systolic function.  Angioedema with ACEI, headaches with hydralazine/nitrates. Echo (3/16) with EF 25-30%, severe LV dilation, normal RV size and systolic  function.  2. HTN: angioedema with ACEI.  3. OSA: on Bipap 4. Morbid obesity 5. Paroxysmal atrial fibrillation: Not documented recently.   6. Smoker.  7. Anxiety/panic attacks 8. PE: 5/15, diagnosed by V/Q scan. CTA chest 8/16 negative for PE.  9. NSVT 10. Hematuria: Apparently had negative workup by urology.  11. ABIs (6/16) were normal 12. 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   SH: On disability, lives in [place], prior ETOH abuse but now dose not drink, no drugs, smokes 1 cig/day.  Has son and daughter.    FH: No premature CAD.    ROS: All systems reviewed and negative except as per HPI.   BP 124/64 mmHg  Pulse 84  Wt 426 lb (193.232 kg)  SpO2 100% General: NAD, obese.  Neck: Thick, JVP 8 cm, no thyromegaly or thyroid nodule.  Lungs: Clear to auscultation bilaterally with normal respiratory effort. CV: Nondisplaced PMI.  Heart regular S1/S2, no S3/S4, no murmur.  1+ ankle edema.  No carotid bruit.  Normal pedal pulses.  Abdomen: Soft, nontender, unable to palpate liver edge, mild distention.  Neurologic: Alert and oriented x 3.  Psych: Normal affect. Extremities: No clubbing or cyanosis.   Assessment/Plan: 1. Chronic systolic CHF: He is volume overloaded on exam and more symptomatic, NYHA class III.  Echo (3/16) with EF 25-30%, severely dilated LV.   - We  have discussed ICD implantation.  He would not be CRT candidate.  He has been ambivalent about ICD in the past.  I am going to repeat echo in 9/16.  If EF remains low, he is willing to get ICD.  - Continue current Toprol XL and spironolactone.  - Continue losartan 50 mg daily.  He had angioedema with ACEI, but the risk of angioedema in this situation with ARB use is low.  - Increase torsemide to 40 mg dailiy with KCl 20 daily.  BMET 2 wks.   - Continue to avoid ETOH.  - Given chest pain and mildly elevated troponin in the ER in 8/16 as well as volume overload on exam and profound fatigue, I will arrange for RHC/LHC to  rule out coronary disease, assess filling pressures, and assess cardiac output.  We discussed risks/benefits today and he agrees to proceed with the test.  He will take ASA 81 until we rule out CAD.  2. Obesity: Needs to continue to work on diet/exercise for weight loss.  3. Smoking: Still smoking but much less than in the past.  I strongly encouraged him to quit.  4. Anxiety/Panic: This is still an issue for him. I will refill his Lexapro. 5. Paroxysmal atrial fibrillation: Remote.  No recent atrial fibrillation noted.  He is no longer anticoagulated.  6. PE: 5/15, diagnosed by V/Q scan.  He was on Xarelto for about a year but stopped not long ago in the setting of hematuria.  CTA chest in 8/16 did not show a PE.   Followup 2 wks after cath.    [name] 08/01/2015

## 2015-08-13 NOTE — Discharge Instructions (Signed)
Venogram, Care After °Refer to this sheet in the next few weeks. These instructions provide you with information on caring for yourself after your procedure. Your health care provider may also give you more specific instructions. Your treatment has been planned according to current medical practices, but problems sometimes occur. Call your health care provider if you have any problems or questions after your procedure. °WHAT TO EXPECT AFTER THE PROCEDURE °After your procedure, it is typical to have the following sensations: °· Mild discomfort at the catheter insertion site. °HOME CARE INSTRUCTIONS  °· Take all medicines exactly as directed. °· Follow any prescribed diet. °· Follow instructions regarding both rest and physical activity. °· Drink more fluids for the first several days after the procedure in order to help flush dye from your kidneys. °SEEK MEDICAL CARE IF: °· You develop a rash. °· You have fever not controlled by medicine. °SEEK IMMEDIATE MEDICAL CARE IF: °· There is pain, drainage, bleeding, redness, swelling, warmth or a red streak at the site of the IV tube. °· The extremity where your IV tube was placed becomes discolored, numb, or cool. °· You have difficulty breathing or shortness of breath. °· You develop chest pain. °· You have excessive dizziness or fainting. °Document Released: 09/07/2013 Document Revised: 11/22/2013 Document Reviewed: 09/07/2013 °ExitCare® Patient Information ©2015 [employer]. This information is not intended to replace advice given to you by your health care provider. Make sure you discuss any questions you have with your health care provider. °Radial Site Care °Refer to this sheet in the next few weeks. These instructions provide you with information on caring for yourself after your procedure. Your caregiver may also give you more specific instructions. Your treatment has been planned according to current medical practices, but problems sometimes occur. Call your  caregiver if you have any problems or questions after your procedure. °HOME CARE INSTRUCTIONS °· You may shower the day after the procedure. Remove the bandage (dressing) and gently wash the site with plain soap and water. Gently pat the site dry. °· Do not apply powder or lotion to the site. °· Do not submerge the affected site in water for 3 to 5 days. °· Inspect the site at least twice daily. °· Do not flex or bend the affected arm for 24 hours. °· No lifting over 5 pounds (2.3 kg) for 5 days after your procedure. °· Do not drive home if you are discharged the same day of the procedure. Have someone else drive you. °· You may drive 24 hours after the procedure unless otherwise instructed by your caregiver. °· Do not operate machinery or power tools for 24 hours. °· A responsible adult should be with you for the first 24 hours after you arrive home. °What to expect: °· Any bruising will usually fade within 1 to 2 weeks. °· Blood that collects in the tissue (hematoma) may be painful to the touch. It should usually decrease in size and tenderness within 1 to 2 weeks. °SEEK IMMEDIATE MEDICAL CARE IF: °· You have unusual pain at the radial site. °· You have redness, warmth, swelling, or pain at the radial site. °· You have drainage (other than a small amount of blood on the dressing). °· You have chills. °· You have a fever or persistent symptoms for more than 72 hours. °· You have a fever and your symptoms suddenly get worse. °· Your arm becomes pale, cool, tingly, or numb. °· You have heavy bleeding from the site. Hold pressure on the site. °  Document Released: 12/20/2010 Document Revised: 02/09/2012 Document Reviewed: 12/20/2010 [facility] Patient Information 2015 [place], [place]. This information is not intended to replace advice given to you by your health care provider. Make sure you discuss any questions you have with your health care provider. Angiogram, Care After Refer to this sheet in the next few weeks.  These instructions provide you with information on caring for yourself after your procedure. Your health care provider may also give you more specific instructions. Your treatment has been planned according to current medical practices, but problems sometimes occur. Call your health care provider if you have any problems or questions after your procedure.  WHAT TO EXPECT AFTER THE PROCEDURE After your procedure, it is typical to have the following sensations:  Minor discomfort or tenderness and a small bump at the catheter insertion site. The bump should usually decrease in size and tenderness within 1 to 2 weeks.  Any bruising will usually fade within 2 to 4 weeks. HOME CARE INSTRUCTIONS   You may need to keep taking blood thinners if they were prescribed for you. Take medicines only as directed by your health care provider.  Do not apply powder or lotion to the site.  Do not take baths, swim, or use a hot tub until your health care provider approves.  You may shower 24 hours after the procedure. Remove the bandage (dressing) and gently wash the site with plain soap and water. Gently pat the site dry.  Inspect the site at least twice daily.  Limit your activity for the first 48 hours. Do not bend, squat, or lift anything over 20 lb (9 kg) or as directed by your health care provider.  Plan to have someone take you home after the procedure. Follow instructions about when you can drive or return to work. SEEK MEDICAL CARE IF:  You get light-headed when standing up.  You have drainage (other than a small amount of blood on the dressing).  You have chills.  You have a fever.  You have redness, warmth, swelling, or pain at the insertion site. SEEK IMMEDIATE MEDICAL CARE IF:   You develop chest pain or shortness of breath, feel faint, or pass out.  You have bleeding, swelling larger than a walnut, or drainage from the catheter insertion site.  You develop pain, discoloration,  coldness, or severe bruising in the leg or arm that held the catheter.  You develop bleeding from any other place, such as the bowels. You may see bright red blood in your urine or stools, or your stools may appear black and tarry.  You have heavy bleeding from the site. If this happens, hold pressure on the site. MAKE SURE YOU:  Understand these instructions.  Will watch your condition.  Will get help right away if you are not doing well or get worse. Document Released: 06/05/2005 Document Revised: 04/03/2014 Document Reviewed: 04/11/2013 [facility] Patient Information 2015 [place], [place]. This information is not intended to replace advice given to you by your health care provider. Make sure you discuss any questions you have with your health care provider.             Excuse from Work    __________________________________________________ needs to be excused from: __x___ Work _____ [employer] _____ Physical activity   Beginning 08/13/15 and through the following date: _____09/13/16_______________ to care for her husband who can not be left alone for 24 hours.    Caregiver's signature: ________________________________________  Date: _________________09/12/16_____________________________________ Document Released: 05/13/2001 Document Revised: 02/09/2012 Document  Reviewed: 11/17/2005 Exit[employer] Patient Information 2015 [place], [place]. This information is not intended to replace advice given to you by your health care provider. Make sure you discuss any questions you have with your health care provider.

## 2015-08-13 NOTE — Progress Notes (Signed)
Site area: rt groin fa sheath Site Prior to Removal:  Level 0. Sheath removed and pressure held 20 minutes by Alison Murray Pressure Applied For:  20 minutes Manual:   yes Patient Status During Pull:  stable Post Pull Site:  Level  yes Post Pull Instructions Given:  yes Post Pull Pulses Present: yes Dressing Applied:  yes Bedrest begins @ 6759 Comments:  0

## 2015-08-13 NOTE — Interval H&P Note (Signed)
Cath Lab Visit (complete for each Cath Lab visit)  Clinical Evaluation Leading to the Procedure:   ACS: No.  Non-ACS:    Anginal Classification: CCS III  Anti-ischemic medical therapy: Minimal Therapy (1 class of medications)  Non-Invasive Test Results: No non-invasive testing performed  Prior CABG: No previous CABG      History and Physical Interval Note:  08/13/2015 9:19 AM  [name]  has presented today for surgery, with the diagnosis of chf  The various methods of treatment have been discussed with the patient and family. After consideration of risks, benefits and other options for treatment, the patient has consented to  Procedure(s): Right/Left Heart Cath and Coronary Angiography (N/A) as a surgical intervention .  The patient's history has been reviewed, patient examined, no change in status, stable for surgery.  I have reviewed the patient's chart and labs.  Questions were answered to the patient's satisfaction.     [name] [employer]

## 2015-08-13 NOTE — Progress Notes (Signed)
Rt ac venous sheath level 0. Rt fa sheath level 0

## 2015-08-14 ENCOUNTER — Encounter (HOSPITAL_COMMUNITY): Payer: Self-pay | Admitting: Cardiology

## 2015-08-15 ENCOUNTER — Ambulatory Visit (HOSPITAL_COMMUNITY): Payer: Medicare Other

## 2015-08-17 ENCOUNTER — Ambulatory Visit (HOSPITAL_COMMUNITY): Payer: Medicare Other

## 2015-08-20 ENCOUNTER — Ambulatory Visit (HOSPITAL_COMMUNITY): Payer: Medicare Other

## 2015-08-21 ENCOUNTER — Ambulatory Visit (HOSPITAL_COMMUNITY)
Admission: RE | Admit: 2015-08-21 | Discharge: 2015-08-21 | Disposition: A | Discharge status: Home / Self Care | Payer: Medicare Other | Source: Ambulatory Visit | Attending: Cardiology | Admitting: Cardiology

## 2015-08-21 VITALS — BP 128/78 | HR 65 | Wt >= 6400 oz

## 2015-08-21 DIAGNOSIS — I5022 Chronic systolic (congestive) heart failure: Secondary | ICD-10-CM | POA: Diagnosis not present

## 2015-08-21 DIAGNOSIS — Z7982 Long term (current) use of aspirin: Secondary | ICD-10-CM | POA: Diagnosis not present

## 2015-08-21 DIAGNOSIS — G629 Polyneuropathy, unspecified: Secondary | ICD-10-CM | POA: Insufficient documentation

## 2015-08-21 DIAGNOSIS — F17210 Nicotine dependence, cigarettes, uncomplicated: Secondary | ICD-10-CM | POA: Insufficient documentation

## 2015-08-21 DIAGNOSIS — Z86711 Personal history of pulmonary embolism: Secondary | ICD-10-CM | POA: Diagnosis not present

## 2015-08-21 DIAGNOSIS — G4733 Obstructive sleep apnea (adult) (pediatric): Secondary | ICD-10-CM | POA: Insufficient documentation

## 2015-08-21 DIAGNOSIS — I428 Other cardiomyopathies: Secondary | ICD-10-CM | POA: Diagnosis not present

## 2015-08-21 DIAGNOSIS — Z7902 Long term (current) use of antithrombotics/antiplatelets: Secondary | ICD-10-CM | POA: Insufficient documentation

## 2015-08-21 DIAGNOSIS — Z79899 Other long term (current) drug therapy: Secondary | ICD-10-CM | POA: Insufficient documentation

## 2015-08-21 DIAGNOSIS — F410 Panic disorder [episodic paroxysmal anxiety] without agoraphobia: Secondary | ICD-10-CM | POA: Insufficient documentation

## 2015-08-21 DIAGNOSIS — I480 Paroxysmal atrial fibrillation: Secondary | ICD-10-CM | POA: Diagnosis not present

## 2015-08-21 DIAGNOSIS — Z720 Tobacco use: Secondary | ICD-10-CM | POA: Diagnosis not present

## 2015-08-21 DIAGNOSIS — I10 Essential (primary) hypertension: Secondary | ICD-10-CM | POA: Diagnosis not present

## 2015-08-21 MED ORDER — TORSEMIDE 20 MG PO TABS: 20.0000 mg | ORAL_TABLET | Freq: Every day | ORAL | Status: DC

## 2015-08-21 MED ORDER — DIGOXIN 125 MCG PO TABS: 0.1250 mg | ORAL_TABLET | Freq: Every day | ORAL | Status: DC

## 2015-08-21 MED ORDER — POTASSIUM CHLORIDE CRYS ER 20 MEQ PO TBCR: 20.0000 meq | EXTENDED_RELEASE_TABLET | Freq: Every day | ORAL | Status: DC

## 2015-08-21 NOTE — Progress Notes (Signed)
Advanced HF Clinic Note  Patient ID: [name], male   DOB: January 21, 1966, 49 y.o.   MRN: [id number] PCP: Dr. Buelah Manis HF Cardiology: [name]  49 yo with history of nonischemic cardiomyopathy presents for cardiology followup.  Echo in 5/15 showed EF 30-35%.  The cardiomyopathy may be due to prior ETOH abuse, he now has quit.  Past cath showed no significant coronary disease.  He is morbidly obese.  He is now smoking 1 cigarette/day.  He was on Xarelto for a PE in 5/15 for about a year and is now off. Repeat echo in 3/16 showed EF 25-30%.   He presents today for HF follow up s/p cath. He was started on digoxin 0.125 mg daily after cath. Cath showed no CAD and normal filling pressures. Fick CO slightly down but questionable due to pt size. He is down 7 lbs from last visit.  He is having pain in his L thigh, though R site was used for cath. Only taking part of half of his torsemide and is only taking potassium when he takes more. Not taking digoxin. Energy level has slightly improved, he felt bad for several days after his cath but feels better now. Using BiPap. Breathing has been fine. He can now make it up steps. No lightheadedness or dizziness.  No orthopnea or PND.  Weight at home 412-415. Still smoking.  Labs (5/13): K 4.1, creatinine 1.05 Labs (1/14): K 3.8, creatinine 1.16, BNP 54 Labs (2/14): K 3.7, creatinine 1.11, BNP 28 Labs (2/16): K 4.1, creatinine 1.03, LDL 96, HCT 40 Labs (3/16): K 3.7, K 1.13, BNP 367 Labs (8/16): BNP 43, K 3.5, creatinine 1.01 Labs (08/13/15): K 3.7, creatinine 1.18, HCT 41.3  PMH: 1. Nonischemic cardiomyopathy: Prior cath with no significant CAD.  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  Prior echoes with EF as low as 25%.  Echo (9/13) with EF 35-40%, moderate to severe LV dilation, diffuse hypokinesis, mild MR. Echo (5/15) with EF 30-35%, moderate to severe LAE, normal RV size and systolic function.  Angioedema with  ACEI, headaches with hydralazine/nitrates. Echo (3/16) with EF 25-30%, severe LV dilation, normal RV size and systolic function.  [facility] 08/13/15 showed no significant coronary disease; RA mean 6, PA 33/11 mean 23, PCWP mean 13, Fick CO/CI 4.75 /1.68 (difficult study, radial artery spasm, if needs future cath would use groin).  2. HTN: angioedema with ACEI.  3. OSA: on Bipap 4. Morbid obesity 5. Paroxysmal atrial fibrillation: Not documented recently.   6. Smoker.  7. Anxiety/panic attacks 8. PE: 5/15, diagnosed by V/Q scan. CTA chest 8/16 negative for PE.  9. NSVT 10. Hematuria: Apparently had negative workup by urology.  11. ABIs (6/16) were normal 12. 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   SH: On disability, lives in [place], prior ETOH abuse but now dose not drink, no drugs, smokes 1 cig/day.  Has son and daughter.    FH: No premature CAD.    ROS: All systems reviewed and negative except as per HPI.   Current Outpatient Prescriptions  Medication Sig Dispense Refill  . aspirin EC 81 MG tablet Take 1 tablet (81 mg total) by mouth daily. 90 tablet 3  . clonazePAM (KLONOPIN) 1 MG tablet Take 1 mg by mouth 3 (three) times daily as needed for anxiety.    Marland Kitchen escitalopram (LEXAPRO) 10 MG tablet Take 1 tablet (10 mg total) by mouth daily. 30 tablet 3  . losartan (COZAAR) 50  MG tablet Take 50 mg by mouth daily.    . Magnesium Oxide 200 MG TABS Take 1 tablet (200 mg total) by mouth daily. 30 tablet 3  . metoprolol succinate (TOPROL-XL) 100 MG 24 hr tablet TAKE 1 TABLET BY MOUTH DAILY WITH OR IMMEDIATELY FOLLOWING A MEAL 30 tablet 2  . NON FORMULARY Bipap, pressure 16/12 with 2L of O2    . nortriptyline (PAMELOR) 10 MG capsule Take 10 mg by mouth at bedtime.    . pantoprazole (PROTONIX) 40 MG tablet TAKE 1 TABLET (40 MG TOTAL) BY MOUTH DAILY. 90 tablet 1  . potassium chloride SA (K-DUR,KLOR-CON) 20 MEQ tablet Take 1 tablet (20 mEq total) by mouth daily.    Marland Kitchen spironolactone (ALDACTONE) 25 MG  tablet TAKE 1 TABLET BY MOUTH DAILY 90 tablet 6  . thiamine (VITAMIN B-1) 100 MG tablet Take 100 mg by mouth daily.    Marland Kitchen torsemide (DEMADEX) 20 MG tablet Take 1 tablet (20 mg total) by mouth daily.    . digoxin (LANOXIN) 0.125 MG tablet Take 1 tablet (0.125 mg total) by mouth daily. 90 tablet 3  . [DISCONTINUED] pravastatin (PRAVACHOL) 40 MG tablet Take 40 mg by mouth daily.    . [DISCONTINUED] sertraline (ZOLOFT) 50 MG tablet Take 1 tablet (50 mg total) by mouth daily. Take  50mg  daily for 1 week then increase to 100mg  daily 60 tablet 2   No current facility-administered medications for this encounter.     BP 128/78 mmHg  Pulse 65  Wt 419 lb 4 oz (190.171 kg)  SpO2 97%   General: NAD, obese.  Neck: Thick, JVP 7 cm, no thyromegaly or thyroid nodule.  Lungs: Clear to auscultation bilaterally with normal respiratory effort. CV: Nondisplaced PMI.  Heart regular S1/S2, no S3/S4, no murmur.  Trace ankle edema.  No carotid bruit.  Normal pedal pulses.  Abdomen: Soft, nontender, unable to palpate liver edge, mild distention.  Neurologic: Alert and oriented x 3.  Psych: Normal affect. Extremities: No clubbing or cyanosis. Right radial artery 2+ pulse.   Assessment/Plan: 1. Chronic systolic CHF: Nonischemic cardiomyopathy.  NYHA class III.  Echo (3/16) with EF 25-30%, severely dilated LV. Recent RHC showed normal filling pressures but decreased Fick cardiac output.  I am not sure the cardiac output was accurate. Volume status looks ok on exam.  NYHA class II symptoms.  - Echo scheduled 08/24/15. Willing to get ICD if EF remains low.  - Continue current Toprol XL and spironolactone.  - Continue losartan 50 mg daily.  He had angioedema with ACEI, but the risk of angioedema in this situation with ARB use is low.  - OK to continue taking Torsemide 20 daily as he has been doing.  Add back KCl 20 daily. BMET today.   - Continue to avoid ETOH.  - Will try and restart Digoxin 0.125, check level in a  week. 2. Obesity: Needs to continue to work on diet/exercise for weight loss.  3. Smoking:  Smoking more, says he has increased in the past few weeks with stress.  Smoking between 1/2 - 1 ppd. Strongly advised to quit.  4. Anxiety/Panic 5. Paroxysmal atrial fibrillation: Remote. He is no longer anticoagulated.  6. PE: 5/15, diagnosed by V/Q scan.  He was on Xarelto for about a year but stopped not long ago in the setting of hematuria.  CTA chest in 8/16 did not show a PE.   Followup 6 weeks  Shirley Friar PA-C 08/21/2015   Patient seen  with PA, agree with the above note. Recent cath showed normal coronaries and normal right and left heart filling pressures.  Cardiac output was low, but I am not sure how accurate Fick reading was in this patient.   - Add back digoxin 0.125 mg daily. - Echo for ?ICD.  - Quit smoking.  [name] 08/21/2015

## 2015-08-21 NOTE — Patient Instructions (Signed)
Restart Digoxin 0.125 mg daily  Take Torsemide 20 mg daily  Take Potassium 20 meq daily  Your physician recommends that you schedule a follow-up appointment in: 6 weeks

## 2015-08-22 ENCOUNTER — Ambulatory Visit (HOSPITAL_COMMUNITY): Payer: Medicare Other

## 2015-08-24 ENCOUNTER — Ambulatory Visit (HOSPITAL_COMMUNITY): Payer: Medicare Other | Attending: Cardiology

## 2015-08-24 ENCOUNTER — Ambulatory Visit (HOSPITAL_COMMUNITY): Payer: Medicare Other

## 2015-08-24 ENCOUNTER — Other Ambulatory Visit: Payer: Self-pay

## 2015-08-24 DIAGNOSIS — Z6841 Body Mass Index (BMI) 40.0 and over, adult: Secondary | ICD-10-CM | POA: Diagnosis not present

## 2015-08-24 DIAGNOSIS — I509 Heart failure, unspecified: Secondary | ICD-10-CM | POA: Diagnosis not present

## 2015-08-24 DIAGNOSIS — E785 Hyperlipidemia, unspecified: Secondary | ICD-10-CM | POA: Diagnosis not present

## 2015-08-24 DIAGNOSIS — E119 Type 2 diabetes mellitus without complications: Secondary | ICD-10-CM | POA: Insufficient documentation

## 2015-08-24 DIAGNOSIS — I517 Cardiomegaly: Secondary | ICD-10-CM | POA: Diagnosis not present

## 2015-08-24 DIAGNOSIS — I5022 Chronic systolic (congestive) heart failure: Secondary | ICD-10-CM | POA: Diagnosis not present

## 2015-08-24 DIAGNOSIS — I351 Nonrheumatic aortic (valve) insufficiency: Secondary | ICD-10-CM | POA: Diagnosis not present

## 2015-08-24 DIAGNOSIS — I10 Essential (primary) hypertension: Secondary | ICD-10-CM | POA: Insufficient documentation

## 2015-08-24 DIAGNOSIS — F17200 Nicotine dependence, unspecified, uncomplicated: Secondary | ICD-10-CM | POA: Insufficient documentation

## 2015-08-24 DIAGNOSIS — I77810 Thoracic aortic ectasia: Secondary | ICD-10-CM | POA: Diagnosis not present

## 2015-08-24 DIAGNOSIS — G4733 Obstructive sleep apnea (adult) (pediatric): Secondary | ICD-10-CM | POA: Diagnosis not present

## 2015-08-24 DIAGNOSIS — K219 Gastro-esophageal reflux disease without esophagitis: Secondary | ICD-10-CM | POA: Insufficient documentation

## 2015-08-24 DIAGNOSIS — I5189 Other ill-defined heart diseases: Secondary | ICD-10-CM | POA: Insufficient documentation

## 2015-08-24 NOTE — Progress Notes (Signed)
Patient did not want Definity. 08/24/2015

## 2015-08-27 ENCOUNTER — Ambulatory Visit (HOSPITAL_COMMUNITY): Payer: Medicare Other

## 2015-08-27 ENCOUNTER — Other Ambulatory Visit: Payer: Self-pay | Admitting: Family Medicine

## 2015-08-27 NOTE — Telephone Encounter (Signed)
Okay to refill give 3 

## 2015-08-27 NOTE — Telephone Encounter (Signed)
Medication refilled per protocol. 

## 2015-08-27 NOTE — Telephone Encounter (Signed)
Ok to refill??  Last office visit 06/22/2015.  Last refill 05/23/2015.

## 2015-08-29 ENCOUNTER — Ambulatory Visit (HOSPITAL_COMMUNITY): Payer: Medicare Other

## 2015-08-30 ENCOUNTER — Other Ambulatory Visit (HOSPITAL_COMMUNITY): Payer: Self-pay | Admitting: Cardiology

## 2015-08-30 DIAGNOSIS — I5022 Chronic systolic (congestive) heart failure: Secondary | ICD-10-CM

## 2015-08-31 ENCOUNTER — Ambulatory Visit (HOSPITAL_COMMUNITY): Payer: Medicare Other

## 2015-09-07 NOTE — Progress Notes (Signed)
ELECTROPHYSIOLOGY CONSULT NOTE  Patient ID: [name], MRN: [id number], DOB/AGE: 47/14/1967 47 y.o. Admit date: (Not on file) Date of Consult: 09/11/2015  Primary Physician: Vic Blackbird, MD Primary Cardiologist: DM  Chief Complaint: ICD    HPI [name] is a 49 y.o. male  \ Seen for consideration of an ICD  Hx on NICM  With depressed EF initially diagnosed 2013. There was moderate-severe LV dilatation at that point. Multiple echoes have confirmed persistent LV dysfunction most recently echo 9/16 demonstrated ejection fraction of 20-25% with moderate LAE  9/16   Cath >> normal filling pressures and no CAD   Echo 9/16  EF 20-25% with Mod LAE ECG QRS LVH with QRS widening at 120 msec  Difficulties with meds  Angioedema with ACE and HA with Hydral.nitrates Echo 9/16>> EF 20-25% with RV dysfunction and moderate LAE.  Hx of remote AFib not on anticoagulation   Also hx of PE for which took Rivaroxaban for some time  He has moderate ongoing problems with shortness of breath. He has some peripheral edema. He has sleep apnea on oxygen supported BiPAP.  He is reluctantly acknowledges at [facility]. ALTHOUGH HE ADMITS WANTING TO STAY ALIVE. HIS WIFE NOTES THAT HE HAS STRUGGLED WITH DEPRESSION ESPECIALLY WHEN HE HAS BEEN INACTIVE   Past Medical History  Diagnosis Date  . Chronic systolic heart failure ([place])   . Edema   . Atrial fibrillation ([place])   . Obesity, unspecified   . Anxiety state, unspecified   . Psychiatric disorder   . Obstructive sleep apnea   . History of medication noncompliance   . Alcohol abuse   . Shortness of breath   . Migraine   . Pulmonary embolism ([place])   . CHF (congestive heart failure) [facility])       Surgical History:  Past Surgical History  Procedure Laterality Date  . Testicle surgery    . Cardiac catheterization    . Cardiac catheterization N/A 08/13/2015    Procedure: Right/Left Heart Cath and  Coronary Angiography;  Surgeon: [name], MD;  Location: [facility];  Service: Cardiovascular;  Laterality: N/A;     Home Meds: Prior to Admission medications   Medication Sig Start Date End Date Taking? Authorizing Provider  aspirin EC 81 MG tablet Take 1 tablet (81 mg total) by mouth daily. 08/01/15   [name], MD  clonazePAM (KLONOPIN) 1 MG tablet TAKE 1 TABLET BY MOUTH 3 TIMES DAILY AS NEEDED 08/27/15   Alycia Rossetti, MD  digoxin (LANOXIN) 0.125 MG tablet Take 1 tablet (0.125 mg total) by mouth daily. 08/21/15   [name], MD  escitalopram (LEXAPRO) 10 MG tablet Take 1 tablet (10 mg total) by mouth daily. 08/01/15   [name], MD  losartan (COZAAR) 50 MG tablet Take 50 mg by mouth daily.    Historical Provider, MD  Magnesium Oxide 200 MG TABS Take 1 tablet (200 mg total) by mouth daily. 08/01/15   [name], MD  metoprolol succinate (TOPROL-XL) 100 MG 24 hr tablet TAKE 1 TABLET BY MOUTH DAILY WITH OR IMMEDIATELY FOLLOWING A MEAL 07/20/15   Alycia Rossetti, MD  NON FORMULARY Bipap, pressure 16/12 with 2L of O2    Historical Provider, MD  nortriptyline (PAMELOR) 10 MG capsule Take 10 mg by mouth at bedtime.    Historical Provider, MD  pantoprazole (PROTONIX) 40 MG tablet TAKE 1 TABLET (40  MG TOTAL) BY MOUTH DAILY. 05/02/15   Alycia Rossetti, MD  potassium chloride SA (K-DUR,KLOR-CON) 20 MEQ tablet Take 1 tablet (20 mEq total) by mouth daily. 08/21/15   [name], MD  spironolactone (ALDACTONE) 25 MG tablet TAKE 1 TABLET BY MOUTH DAILY 10/18/14   Alycia Rossetti, MD  thiamine (VITAMIN B-1) 100 MG tablet Take 100 mg by mouth daily.    Historical Provider, MD  torsemide (DEMADEX) 20 MG tablet Take 1 tablet (20 mg total) by mouth daily. 08/21/15   [name], MD    Allergies:  Allergies  Allergen Reactions  . Ace Inhibitors Anaphylaxis and Swelling  . Buspirone Other (See Comments)    dizziness    Social History   Social History  . Marital  Status: Married    Spouse Name: N/A  . Number of Children: 3  . Years of Education: N/A   Occupational History  . DISABLED    Social History Main Topics  . Smoking status: Current Every Day Smoker -- 0.50 packs/day for 30 years    Types: Cigarettes  . Smokeless tobacco: Never Used     Comment: 10/2014- using patches  . Alcohol Use: 0.0 oz/week    0 Standard drinks or equivalent per week     Comment: nothing to drink since last OV with Dr. Harl Bowie per patient  . Drug Use: No  . Sexual Activity: Yes   Other Topics Concern  . Not on file   Social History Narrative   He smokes about a pack per day and he has been smoking since he was 49 years of age.  He drinks alcohol occasionally, but he denies any illicit drug abuse.  He is presently on disability.    Lives with wife in a 2 story home.  Has 2 children.   Previously worked in [employer], last worked in 1998.   Highest level of education:  11th grade             Family History  Problem Relation Age of Onset  . Hypertension      Family History  . Stroke      Family History  . Diabetes      Family History  . Cancer Mother     brain tumor  . Hypertension Mother   . Diabetes Father     Deceased, 12  . Heart disease Maternal Grandmother   . Diabetes Daughter      ROS:  Please see the history of present illness.     All other systems reviewed and negative.    Physical Exam: Blood pressure 140/86, pulse 63, height 5' 8.5" (1.74 m), weight 414 lb (187.789 kg). General: Well developed, Morbidly obese Age appearing African-American  male in no acute distress. Head: Normocephalic, atraumatic, sclera non-icteric, no xanthomas, nares are without discharge. EENT: normal  Lymph Nodes:  none Neck: Negative for carotid bruits. JVD not elevated. Back:without scoliosis kyphosis Lungs: Clear bilaterally to auscultation without wheezes, rales, or rhonchi. Breathing is unlabored. Heart: RRR with S1 S2.  2/6 systolic  murmur . No  rubs, or gallops appreciated. Abdomen: Soft, non-tender, non-distended with normoactive bowel sounds. No hepatomegaly. No rebound/guarding. No obvious abdominal masses. Msk:  Strength and tone appear normal for age. Extremities: No clubbing or cyanosis.  1+  edema.  Distal pedal pulses are 2+ and equal bilaterally. Skin: Warm and Dry Neuro: Alert and oriented X 3. CN III-XII intact except esotropia Grossly normal sensory and motor function .  Psych:  Responds to questions appropriately with a normal affect.      Labs: Cardiac Enzymes No results for input(s): CKTOTAL, CKMB, TROPONINI in the last 72 hours. CBC Lab Results  Component Value Date   WBC 9.5 08/13/2015   HGB 14.5 08/13/2015   HCT 41.3 08/13/2015   MCV 83.1 08/13/2015   PLT 221 08/13/2015   PROTIME: No results for input(s): LABPROT, INR in the last 72 hours. Chemistry No results for input(s): NA, K, CL, CO2, BUN, CREATININE, CALCIUM, PROT, BILITOT, ALKPHOS, ALT, AST, GLUCOSE in the last 168 hours.  Invalid input(s): LABALBU Lipids Lab Results  Component Value Date   CHOL 166 01/22/2015   HDL 28* 01/22/2015   LDLCALC 96 01/22/2015   TRIG 208* 01/22/2015   BNP PRO B NATRIURETIC PEPTIDE (BNP)  Date/Time Value Ref Range Status  05/24/2014 03:46 AM 451.6* 0 - 125 pg/mL Final  06/27/2010 07:46 PM 118.0* 0.0 - 100.0 pg/mL Final  03/20/2010 05:23 PM 131.0* 0.0 - 100.0 pg/mL Final  03/19/2010 06:32 AM 308.0* 0.0 - 100.0 pg/mL Final   Thyroid Function Tests: No results for input(s): TSH, T4TOTAL, T3FREE, THYROIDAB in the last 72 hours.  Invalid input(s): FREET3 Miscellaneous No results found for: DDIMER  Radiology/Studies:  No results found.  EKG: Sinus at 63 Interval 17/13/43 Left bundle branch block   Assessment and Plan:  Nonischemic cardio myopathy  Congestive heart failure class III  Morbid obesity  Depression  Obstructive sleep apnea  Benzodiazepine use  Paroxysmal atrial  fibrillation  Nonsustained ventricular tachycardia   The patient has a persistent nonischemic cardiomyopathy despite guidelines directed medical therapy. He has class III symptoms. In the context of the [place] trial, he remains a reasonable thing in this young man to consider ICD implantation. I've reviewed the benefits and risks with him and his wife. He had this point remains reluctant to consider ICD implant.  Nonsustained ventricular tachycardia particularly in a group of nonischemic cardiomyopathy patient's his Ace predictor of increased overall risk but not an increase of sudden death risk. Is not particularly helpful as a risk stratify her for the use of ICD  He will continue on guideline directed therapy; it is noteworthy that he was changed from carvedilol to metoprolol following an episode of cold water-induced atrial fibrillation some years ago. It is his impression that fatigue has been considerably worse since his strokes which was made. He is currently also on losartan with considerations towards moving towards Entresto the drug intolerances been an issue.  I've encouraged him to continue exercising; he has lost 60 pounds (12-15%)  He is not on anticoagulation; I will defer this to his primary cardiology team.      Virl Axe

## 2015-09-11 ENCOUNTER — Encounter: Payer: Self-pay | Admitting: *Deleted

## 2015-09-11 ENCOUNTER — Encounter: Payer: Self-pay | Admitting: Internal Medicine

## 2015-09-11 ENCOUNTER — Ambulatory Visit (INDEPENDENT_AMBULATORY_CARE_PROVIDER_SITE_OTHER): Payer: Medicare Other | Admitting: Internal Medicine

## 2015-09-11 VITALS — BP 140/86 | HR 63 | Ht 68.5 in | Wt >= 6400 oz

## 2015-09-11 DIAGNOSIS — I5022 Chronic systolic (congestive) heart failure: Secondary | ICD-10-CM

## 2015-09-11 NOTE — Patient Instructions (Signed)
Medication Instructions: - no changes  Labwork: - none  Procedures/Testing: - none  Follow-Up: - Dr. Klein will see you back on an as needed basis.  Any Additional Special Instructions Will Be Listed Below (If Applicable). - none  

## 2015-09-26 ENCOUNTER — Telehealth (HOSPITAL_COMMUNITY): Payer: Self-pay | Admitting: *Deleted

## 2015-09-26 NOTE — Telephone Encounter (Signed)
Pt called stated he has a "bad cold" currently taking mucinex but was prescribed an antibiotic and was told to take that if his cold got worse. Pt stated his bp was low 90\52 ... Dr.McLean advised pt to stop all meds except mucinex for one day. Pt said he would stop mucinex and start antibiotics tomorrow. He has a follow up appt 10/28

## 2015-09-28 ENCOUNTER — Encounter (HOSPITAL_COMMUNITY): Payer: Medicare Other

## 2015-10-22 ENCOUNTER — Telehealth: Payer: Self-pay | Admitting: Internal Medicine

## 2015-10-22 ENCOUNTER — Other Ambulatory Visit: Payer: Self-pay | Admitting: Family Medicine

## 2015-10-22 NOTE — Telephone Encounter (Signed)
NewMessage  Pt calling to speakw / RN about device procedure. Please call back and discuss.

## 2015-10-22 NOTE — Telephone Encounter (Signed)
Medication filled x1 with no refills.   Requires office visit before any further refills can be given.   Letter sent.  

## 2015-10-22 NOTE — Telephone Encounter (Signed)
I called and spoke with the patient. He states he mixed up his MD appointments and missed his CHF clinic appointment with Dr. Aundra Dubin. He called to the CHF clinic to reschedule, but they have nothing until 12/30 per his report. He was advised to call here and have [place] call over to the CHF clinic to get him in sooner.  He is leaning toward getting an ICD, but needed to follow up with CHF clinic first.  I advised I will touch base with the CHF clinic and they will be in touch with him to schedule after I review with [name]. He is voices understanding.

## 2015-10-23 NOTE — Telephone Encounter (Signed)
Late entry- reviewed with [name] last night- he placed a call to Dr. Aundra Dubin who states he will call the patient. Per [name]- patient may be a candidate for S-ICD- he will need to speak with the patient after Dr. Aundra Dubin does.

## 2015-10-26 ENCOUNTER — Other Ambulatory Visit: Payer: Self-pay | Admitting: Family Medicine

## 2015-10-29 NOTE — Telephone Encounter (Signed)
Medication filled x1 with no refills.   Requires office visit before any further refills can be given.  

## 2015-10-29 NOTE — Telephone Encounter (Signed)
Have tried to call, have not gotten hold of him yet.  Will try again.

## 2015-10-29 NOTE — Telephone Encounter (Signed)
Dr. Aundra Dubin,  This is the patient [name] called you about. Just let me know if you speak with him and what he decides about an ICD.  Thanks!

## 2015-11-06 NOTE — Telephone Encounter (Signed)
I talked to [name].  He is ready to get the ICD implanted.  Will await a call from EP.

## 2015-11-08 NOTE — Telephone Encounter (Signed)
Reviewed again with [name]- he would like to see the patient back in the office prior to implant as he may be a candidate for S-ICD. Will notify patient and schedule for followup.

## 2015-11-09 NOTE — Telephone Encounter (Signed)
Scheduler called patient to arrange.   OV scheduled for 12/14.

## 2015-11-12 ENCOUNTER — Observation Stay (HOSPITAL_COMMUNITY)
Admission: EM | Admit: 2015-11-12 | Discharge: 2015-11-14 | Disposition: A | Discharge status: Home / Self Care | Payer: Medicare Other | Attending: Cardiovascular Disease | Admitting: Cardiovascular Disease

## 2015-11-12 ENCOUNTER — Emergency Department (HOSPITAL_COMMUNITY): Payer: Medicare Other

## 2015-11-12 ENCOUNTER — Encounter (HOSPITAL_COMMUNITY): Payer: Self-pay | Admitting: Emergency Medicine

## 2015-11-12 ENCOUNTER — Telehealth: Payer: Self-pay | Admitting: Internal Medicine

## 2015-11-12 DIAGNOSIS — Z79899 Other long term (current) drug therapy: Secondary | ICD-10-CM | POA: Insufficient documentation

## 2015-11-12 DIAGNOSIS — F419 Anxiety disorder, unspecified: Secondary | ICD-10-CM | POA: Insufficient documentation

## 2015-11-12 DIAGNOSIS — R42 Dizziness and giddiness: Secondary | ICD-10-CM

## 2015-11-12 DIAGNOSIS — I493 Ventricular premature depolarization: Secondary | ICD-10-CM | POA: Insufficient documentation

## 2015-11-12 DIAGNOSIS — I429 Cardiomyopathy, unspecified: Secondary | ICD-10-CM

## 2015-11-12 DIAGNOSIS — I447 Left bundle-branch block, unspecified: Secondary | ICD-10-CM | POA: Diagnosis not present

## 2015-11-12 DIAGNOSIS — Z86711 Personal history of pulmonary embolism: Secondary | ICD-10-CM | POA: Insufficient documentation

## 2015-11-12 DIAGNOSIS — E781 Pure hyperglyceridemia: Secondary | ICD-10-CM | POA: Diagnosis not present

## 2015-11-12 DIAGNOSIS — I480 Paroxysmal atrial fibrillation: Secondary | ICD-10-CM | POA: Diagnosis not present

## 2015-11-12 DIAGNOSIS — G4733 Obstructive sleep apnea (adult) (pediatric): Secondary | ICD-10-CM | POA: Diagnosis not present

## 2015-11-12 DIAGNOSIS — R002 Palpitations: Secondary | ICD-10-CM | POA: Diagnosis not present

## 2015-11-12 DIAGNOSIS — Z9114 Patient's other noncompliance with medication regimen: Secondary | ICD-10-CM | POA: Diagnosis not present

## 2015-11-12 DIAGNOSIS — F17210 Nicotine dependence, cigarettes, uncomplicated: Secondary | ICD-10-CM | POA: Insufficient documentation

## 2015-11-12 DIAGNOSIS — Z6841 Body Mass Index (BMI) 40.0 and over, adult: Secondary | ICD-10-CM | POA: Insufficient documentation

## 2015-11-12 DIAGNOSIS — I422 Other hypertrophic cardiomyopathy: Secondary | ICD-10-CM | POA: Diagnosis not present

## 2015-11-12 DIAGNOSIS — F1010 Alcohol abuse, uncomplicated: Secondary | ICD-10-CM | POA: Insufficient documentation

## 2015-11-12 DIAGNOSIS — Z7982 Long term (current) use of aspirin: Secondary | ICD-10-CM | POA: Diagnosis not present

## 2015-11-12 DIAGNOSIS — R0602 Shortness of breath: Secondary | ICD-10-CM | POA: Diagnosis not present

## 2015-11-12 DIAGNOSIS — I5022 Chronic systolic (congestive) heart failure: Secondary | ICD-10-CM | POA: Insufficient documentation

## 2015-11-12 DIAGNOSIS — Z8679 Personal history of other diseases of the circulatory system: Secondary | ICD-10-CM

## 2015-11-12 LAB — BASIC METABOLIC PANEL
Anion gap: 6 (ref 5–15)
BUN: 12 mg/dL (ref 6–20)
CALCIUM: 9.1 mg/dL (ref 8.9–10.3)
CO2: 28 mmol/L (ref 22–32)
CREATININE: 1.09 mg/dL (ref 0.61–1.24)
Chloride: 105 mmol/L (ref 101–111)
GFR calc non Af Amer: >60 mL/min (ref >60)
GLUCOSE: 189 mg/dL — AB (ref 65–99)
Potassium: 3.8 mmol/L (ref 3.5–5.1)
Sodium: 139 mmol/L (ref 135–145)

## 2015-11-12 LAB — I-STAT TROPONIN, ED: Troponin i, poc: 0.05 ng/mL (ref 0.00–0.08)

## 2015-11-12 LAB — CBC
HEMATOCRIT: 41.1 % (ref 39.0–52.0)
Hemoglobin: 14.4 g/dL (ref 13.0–17.0)
MCH: 29.1 pg (ref 26.0–34.0)
MCHC: 35 g/dL (ref 30.0–36.0)
MCV: 83.2 fL (ref 78.0–100.0)
PLATELETS: 216 10*3/uL (ref 150–400)
RBC: 4.94 MIL/uL (ref 4.22–5.81)
RDW: 13.9 % (ref 11.5–15.5)
WBC: 8.1 10*3/uL (ref 4.0–10.5)

## 2015-11-12 LAB — D-DIMER, QUANTITATIVE: D-Dimer, Quant: 0.69 ug/mL-FEU — ABNORMAL HIGH (ref 0.00–0.50)

## 2015-11-12 LAB — TSH: TSH: 1.788 u[IU]/mL (ref 0.350–4.500)

## 2015-11-12 LAB — BRAIN NATRIURETIC PEPTIDE: B Natriuretic Peptide: 77.8 pg/mL (ref 0.0–100.0)

## 2015-11-12 LAB — DIGOXIN LEVEL: Digoxin Level: <0.2 ng/mL — ABNORMAL LOW (ref 0.8–2.0)

## 2015-11-12 MED ORDER — SPIRONOLACTONE 25 MG PO TABS
25.0000 mg | ORAL_TABLET | Freq: Every day | ORAL | Status: DC
  Administered 2015-11-13 – 2015-11-14 (×2): 25 mg via ORAL
  Filled 2015-11-12 (×2): qty 1

## 2015-11-12 MED ORDER — LOSARTAN POTASSIUM 50 MG PO TABS
50.0000 mg | ORAL_TABLET | Freq: Every day | ORAL | Status: DC
  Administered 2015-11-12 – 2015-11-13 (×2): 50 mg via ORAL
  Filled 2015-11-12 (×2): qty 1

## 2015-11-12 MED ORDER — ASPIRIN EC 81 MG PO TBEC
81.0000 mg | DELAYED_RELEASE_TABLET | Freq: Every day | ORAL | Status: DC
  Administered 2015-11-13 – 2015-11-14 (×2): 81 mg via ORAL
  Filled 2015-11-12 (×2): qty 1

## 2015-11-12 MED ORDER — METOPROLOL SUCCINATE ER 100 MG PO TB24
100.0000 mg | ORAL_TABLET | Freq: Every day | ORAL | Status: DC
  Administered 2015-11-13 – 2015-11-14 (×2): 100 mg via ORAL
  Filled 2015-11-12 (×2): qty 1

## 2015-11-12 MED ORDER — VITAMIN B-1 100 MG PO TABS
100.0000 mg | ORAL_TABLET | Freq: Every day | ORAL | Status: DC
  Administered 2015-11-13 – 2015-11-14 (×2): 100 mg via ORAL
  Filled 2015-11-12 (×2): qty 1

## 2015-11-12 MED ORDER — TORSEMIDE 20 MG PO TABS
20.0000 mg | ORAL_TABLET | Freq: Every day | ORAL | Status: DC
  Administered 2015-11-13: 20 mg via ORAL
  Filled 2015-11-12 (×2): qty 1

## 2015-11-12 MED ORDER — PANTOPRAZOLE SODIUM 40 MG PO TBEC
40.0000 mg | DELAYED_RELEASE_TABLET | Freq: Every day | ORAL | Status: DC
  Administered 2015-11-13 – 2015-11-14 (×2): 40 mg via ORAL
  Filled 2015-11-12 (×3): qty 1

## 2015-11-12 MED ORDER — ONDANSETRON HCL 4 MG/2ML IJ SOLN: 4.0000 mg | Freq: Four times a day (QID) | INTRAMUSCULAR | Status: DC | PRN

## 2015-11-12 MED ORDER — POTASSIUM CHLORIDE CRYS ER 20 MEQ PO TBCR
20.0000 meq | EXTENDED_RELEASE_TABLET | Freq: Every day | ORAL | Status: DC
  Administered 2015-11-12 – 2015-11-14 (×3): 20 meq via ORAL
  Filled 2015-11-12 (×3): qty 1

## 2015-11-12 MED ORDER — DIGOXIN 125 MCG PO TABS: 0.1250 mg | ORAL_TABLET | Freq: Every day | ORAL | Status: DC

## 2015-11-12 MED ORDER — MAGNESIUM OXIDE 400 (241.3 MG) MG PO TABS
200.0000 mg | ORAL_TABLET | Freq: Every day | ORAL | Status: DC
  Administered 2015-11-13 – 2015-11-14 (×2): 200 mg via ORAL
  Filled 2015-11-12 (×2): qty 1

## 2015-11-12 MED ORDER — ACETAMINOPHEN 325 MG PO TABS: 650.0000 mg | ORAL_TABLET | ORAL | Status: DC | PRN

## 2015-11-12 MED ORDER — CLONAZEPAM 0.5 MG PO TABS
0.5000 mg | ORAL_TABLET | Freq: Every day | ORAL | Status: DC
  Administered 2015-11-12 – 2015-11-13 (×2): 0.5 mg via ORAL
  Filled 2015-11-12 (×2): qty 1

## 2015-11-12 MED ORDER — ESCITALOPRAM OXALATE 10 MG PO TABS
10.0000 mg | ORAL_TABLET | Freq: Every day | ORAL | Status: DC
  Administered 2015-11-13 – 2015-11-14 (×2): 10 mg via ORAL
  Filled 2015-11-12 (×2): qty 1

## 2015-11-12 MED ORDER — NORTRIPTYLINE HCL 10 MG PO CAPS: 10.0000 mg | ORAL_CAPSULE | Freq: Every day | ORAL | Status: DC

## 2015-11-12 NOTE — H&P (Addendum)
Patient ID: KOLBY GARRON MRN: [id number] DOB/AGE: 1966-05-01 49 y.o.  Admit date: 11/12/2015  Primary Physician   Vic Blackbird, MD Primary Cardiologist   Dr. Aundra Dubin   HPI: WALFRED FARSON is a 49 y.o. male with a history of NCIM, remote hx PAF, hx of PE 03/2014 for which took Rivaroxaban for year (now off), current tobacco smoker, OSA, who came to ED for evaluation of palpitations.   Multiple echoes have confirmed persistent LV dysfunction most recently echo 9/16 demonstrated ejection fraction of 20-25% with moderate LAE. Cath 08/2015 showed normal filling pressures and no CAD. He was evaluated by [name] for possible ICD placement, the patient has initially declined however called office 2 days ago and has a upcoming appointment with [name] 12/14 for reconsideration of ICD placement. Previously it was felt that his cardiomyopathy may be due to alcohol abuse, he has quit. However, used 2 beers and 3 shots of liquor 2 days ago.  Patient was presented with a one-day history of palpitations. The palpitation lasts for a few seconds that associated with severe shortness of breath, dizziness and somewhat blurred vision. Now improved becoming more frequent that is why he came for further evaluation. His wife was rubbing his back today  and he got a sudden onset of racing and fluttering of his heart. He took 100 mg dose of metoprolol and aspirin.   EKG showed normal sinus rhythm with incomplete left bundle branch block which appears chronic. Chest x-ray showed cardiomegaly without failure. Pending other labs.  Patient recently evaluated by urology because of urinary stream. He recommended cystoscopy was a however patient denied. Difficulties with meds   Angioedema with ACE and HA with Hydral.nitrates. Now tolerating ARB. The patient also complaining of left mid to lower back bump which he is thinking that it might brought of his palpitation.  Past Medical History  Diagnosis Date  .  Chronic systolic heart failure ([place])   . Edema   . Atrial fibrillation ([place])   . Obesity, unspecified   . Anxiety state, unspecified   . Psychiatric disorder   . Obstructive sleep apnea   . History of medication noncompliance   . Alcohol abuse   . Shortness of breath   . Migraine   . Pulmonary embolism ([place])   . CHF (congestive heart failure) [facility])      Past Surgical History  Procedure Laterality Date  . Testicle surgery    . Cardiac catheterization    . Cardiac catheterization N/A 08/13/2015    Procedure: Right/Left Heart Cath and Coronary Angiography;  Surgeon: [name], MD;  Location: [place];  Service: Cardiovascular;  Laterality: N/A;    Allergies  Allergen Reactions  . Ace Inhibitors Anaphylaxis and Swelling  . Buspirone Other (See Comments)    dizziness    I have reviewed the patient's current medications     Prior to Admission medications   Medication Sig Start Date End Date Taking? Authorizing Provider  aspirin EC 81 MG tablet Take 1 tablet (81 mg total) by mouth daily. 08/01/15  Yes [name], MD  clonazePAM (KLONOPIN) 1 MG tablet TAKE 1 TABLET BY MOUTH 3 TIMES DAILY AS NEEDED 08/27/15  Yes Alycia Rossetti, MD  escitalopram (LEXAPRO) 10 MG tablet Take 1 tablet (10 mg total) by mouth daily. 08/01/15  Yes [name], MD  losartan (COZAAR) 50 MG tablet Take 50 mg by mouth at bedtime.    Yes Historical  Provider, MD  Magnesium Oxide 200 MG TABS Take 1 tablet (200 mg total) by mouth daily. 08/01/15  Yes [name], MD  metoprolol succinate (TOPROL-XL) 100 MG 24 hr tablet TAKE 1 TABLET BY MOUTH DAILY WITH OR IMMEDIATELY FOLLOWING A MEAL 10/22/15  Yes Alycia Rossetti, MD  NON FORMULARY Bipap, pressure 16/12 with 2L of O2   Yes Historical Provider, MD  pantoprazole (PROTONIX) 40 MG tablet TAKE 1 TABLET (40 MG TOTAL) BY MOUTH DAILY. 10/29/15  Yes Alycia Rossetti, MD  potassium chloride SA (K-DUR,KLOR-CON) 20 MEQ tablet Take 1 tablet (20 mEq  total) by mouth daily. 08/21/15  Yes [name], MD  spironolactone (ALDACTONE) 25 MG tablet TAKE 1 TABLET BY MOUTH DAILY 10/18/14  Yes Alycia Rossetti, MD  thiamine (VITAMIN B-1) 100 MG tablet Take 100 mg by mouth daily.   Yes Historical Provider, MD  torsemide (DEMADEX) 20 MG tablet Take 1 tablet (20 mg total) by mouth daily. 08/21/15  Yes [name], MD  digoxin (LANOXIN) 0.125 MG tablet Take 1 tablet (0.125 mg total) by mouth daily. Patient not taking: Reported on 11/12/2015 08/21/15   [name], MD  nortriptyline (PAMELOR) 10 MG capsule Take 10 mg by mouth at bedtime.    Historical Provider, MD     Social History   Social History  . Marital Status: Married    Spouse Name: N/A  . Number of Children: 3  . Years of Education: N/A   Occupational History  . DISABLED    Social History Main Topics  . Smoking status: Current Every Day Smoker -- 0.50 packs/day for 30 years    Types: Cigarettes  . Smokeless tobacco: Never Used     Comment: 10/2014- using patches  . Alcohol Use: 0.0 oz/week    0 Standard drinks or equivalent per week     Comment: nothing to drink since last OV with Dr. Harl Bowie per patient  . Drug Use: No  . Sexual Activity: Yes   Other Topics Concern  . Not on file   Social History Narrative   He smokes about a pack per day and he has been smoking since he was 49 years of age.  He drinks alcohol occasionally, but he denies any illicit drug abuse.  He is presently on disability.    Lives with wife in a 2 story home.  Has 2 children.   Previously worked in [employer], last worked in 1998.   Highest level of education:  11th grade            Family Status  Relation Status Death Age  . Mother Alive   . Father Alive   . Maternal Grandmother Alive    Family History  Problem Relation Age of Onset  . Hypertension      Family History  . Stroke      Family History  . Diabetes      Family History  . Cancer Mother     brain tumor  . Hypertension  Mother   . Diabetes Father     Deceased, 47  . Heart disease Maternal Grandmother   . Diabetes Daughter        ROS:  Full 14 point review of systems complete and found to be negative unless listed above.  Physical Exam: Blood pressure 109/58, pulse 65, temperature 98.6 F (37 C), temperature source Oral, resp. rate 23, height 5\' 8"  (1.727 m), weight 427 lb (193.686 kg), SpO2 98 %.  General: Well developed, well nourished, severely obese  male in no acute distress Head: Eyes PERRLA, No xanthomas. Normocephalic and atraumatic, oropharynx without edema or exudate.  Lungs: Resp regular and unlabored. Diminished breath sound.  Heart: RRR no s3, s4, or murmurs..   Neck: No carotid bruits. No lymphadenopathy.  No JVD. Abdomen: Bowel sounds present, abdomen soft and non-tender without masses or hernias noted. Msk:  No spine or cva tenderness. No weakness, no joint deformities or effusions. Extremities: No clubbing, cyanosis or edema. DP/PT/Radials 2+ and equal bilaterally. Mid lower back has a mobile lump.  Neuro: Alert and oriented X 3. No focal deficits noted. Psych:  Good affect, responds appropriately Skin: No rashes or lesions noted.  Labs:   Lab Results  Component Value Date   WBC 9.5 08/13/2015   HGB 14.5 08/13/2015   HCT 41.3 08/13/2015   MCV 83.1 08/13/2015   PLT 221 08/13/2015   PRO B NATRIURETIC PEPTIDE (BNP)  Date/Time Value Ref Range Status  05/24/2014 03:46 AM 451.6* 0 - 125 pg/mL Final  06/27/2010 07:46 PM 118.0* 0.0 - 100.0 pg/mL Final   Lab Results  Component Value Date   CHOL 166 01/22/2015   HDL 28* 01/22/2015   LDLCALC 96 01/22/2015   TRIG 208* 01/22/2015    Echo: LV EF: 20% -  25%  ------------------------------------------------------------------- Indications:   CHF (I50.22).  ------------------------------------------------------------------- History:  PMH:  Atrial fibrillation. Congestive heart failure. Hypertrophic cardiomyopathy. Risk  factors: Migraine. NSVT. Pulmonary embolus. GERD. OSA on CPAP. Depression. Anxiety. Insomnia. Gout. ETOH. Anemia. Current tobacco use. Hypertension. Diabetes mellitus. Morbidly obese. Dyslipidemia.  ------------------------------------------------------------------- Study Conclusions  - Left ventricle: The cavity size was severely dilated. Wall thickness was increased in a pattern of mild LVH. There was mild focal basal hypertrophy of the septum. Systolic function was severely reduced. The estimated ejection fraction was in the range of 20% to 25%. Diffuse hypokinesis. Doppler parameters are consistent with abnormal left ventricular relaxation (grade 1 diastolic dysfunction). - Aortic valve: There was trivial regurgitation. - Aortic root: The aortic root was mildly dilated. - Ascending aorta: The ascending aorta was mildly dilated. - Left atrium: The atrium was moderately dilated. - Right ventricle: Systolic function was mildly reduced.  Impressions:  - Technically difficult; patient declined definity; severe global reduction in LV function; mild LVH with proximal septal thickening; severe LVE; grade 1 diastolic dysfunction; moderate LAE; trace AI and MR; mildly reduced RV function.  ECG:   Vent. rate 71 BPM PR interval 165 ms QRS duration 119 ms QT/QTc 449/488 ms P-R-T axes 58 47 6  Radiology:  Dg Chest 2 View  11/12/2015  CLINICAL DATA:  New onset of palpitations and chest pain. Shortness of breath. EXAM: CHEST - 2 VIEW COMPARISON:  CTA chest 07/27/2015 FINDINGS: The heart is mildly enlarged. There is no edema or effusion to suggest failure. Minimal bibasilar airspace disease likely reflects atelectasis. Multilevel degenerate changes are again noted within the thoracic spine. Fused anterior osteophytes compatible with DISH. IMPRESSION: 1. Cardiomegaly without failure. 2. Mild bibasilar airspace disease likely reflects atelectasis. Electronically Signed   By:  San Morelle M.D.   On: 11/12/2015 15:15    ASSESSMENT AND PLAN:     1. Chronic systolic heart failure/nonischemic cardiomyopathy - most recent echo 9/16 demonstrated ejection fraction of 20-25% with moderate LAE. Cath 08/2015 showed normal filling pressures and no CAD. He was evaluated by [name] for possible ICD placement, the patient has initially declined however called office 2 days ago and  has a upcoming appointment with [name] 12/14 for reconsideration of ICD placement. Previously it was felt that his cardiomyopathy may be due to alcohol abuse, he has quit. However, used 2 beers and 3 shots of liquor 2 days ago. - Will check BNP. He appears euvolemic. Will get EP evaluation during admission for ICD placement tomorrow.  - Continue digoxin, losartan, metoprolol, spironolactone and torsemid  2. Hx of PAF/flutter - CHADSVASCS score of 1 for CHF.  - Presented with the palpitation, no documented A. fib/A flutter.  Will admit him on a telemetry. May need event monitor at discharge. Will place him on DVT prophylaxis dose of Lovenox for now due to  possible ICD placement.  - Continue beta blocker  3. Tobacco abuse - Encouraged cessation. Education given.   4.Alcohol abuse -  previoulsy quit.   5. Urinary stream issue - Follow-up with urology as outpatient.  No hematuria per patient. States urinary steam in all direction.   6. History of pulmonary embolism 03/2014 - diagnosed by V/Q scan. He was on Xarelto for about a year but stopped  in the setting of hematuria. CTA chest in 8/16 did not show a PE.  - Will get D-dimer.    SignedLeanor Kail, [facility] 11/12/2015, 4:29 PM Pager [phone]  Co-Sign MD  Patient seen and examined. Agree with assessment and plan.  Mr. Renwick Piere is a 49 year old morbidly obese African-American gentleman who has a history of severe nonischemic cardiomyopathy.  A recent right and left heart catheterization in September 2016 revealed severe  radial artery spasm and no obstructive CAD.  The patient has a history of paroxysmal atrial fibrillation as well as remote history of PE in May 2015 for which he was on Xarelto for anticoagulation.  This ultimately was stopped secondary to development of hematuria.  A subsequent chest CT in August 2016 did not show any residual abnormality.  The patient had seen Dr. Virl Axe in October 2016 for consideration of possible ICD implantation  but at the time the patient was against an ICD.  He recently has begun to reconsider.  Today he experienced an episode where his heart began to race.  This was associated with dizziness and shortness of breath.  He believes it may have lasted several minutes and ultimately resolved spontaneously.  We do not have any documentation of what he had experienced, but it is suggestive of possible nonsustained ventricular tachycardia.  The patient has a prior significant EtOH history and had stopped, but did imbibe with several drinks 2 days ago.  His last echo Doppler study has shown an ejection fraction of approximately 20-25% with diffuse hypokinesis.  In the past he developed angioedema secondary to ACE-inhibition, but apparently has been able to tolerate ARB therapy with losartan without compromise.  He may be able to benefit from possible initiation of Entresto in the future.  I have discussed the benefits of ICD implantation in particular with reference to prophylaxis against sudden cardiac death.  We will admit the patient to the hospital, monitor his heart rhythm, and obtain admission laboratory including Mg, BMP and d-dimer.  Will  ask EP to reevaluate for reconsideration of ICD implantation during his hospitalization.  Will potentially keep NPO in the a.m. in the event this can be done tomorrow.  We will hold off initiation of anticoagulation presently.     Troy Sine, MD, [facility] 11/12/2015 5:28 PM

## 2015-11-12 NOTE — Telephone Encounter (Signed)
Patient wants [name] know that he is in the ED today complaining with a bout of A Flutter

## 2015-11-12 NOTE — ED Notes (Signed)
Attempted report to 2W °

## 2015-11-12 NOTE — Progress Notes (Signed)
RT set up non invasive for pt. Auto titration mode with 2L of oxygen. Pt. Places on mask when ready for bed.

## 2015-11-12 NOTE — Progress Notes (Signed)
Pt arrived to unit alert/oriented.  Pt oriented to unit, CCMD notified.  Pt resting comfortably.  Will cont to monitor pt closely.  Claudette Stapler, RN

## 2015-11-12 NOTE — Telephone Encounter (Signed)
New Message  Pt wanted to make office aware that he is in ED- after bout of AFlutter. Please call back and discuss.

## 2015-11-12 NOTE — ED Provider Notes (Addendum)
CSN: [id number]     Arrival date & time 11/12/15  1444 History   First MD Initiated Contact with Patient 11/12/15 1448     Chief Complaint  Patient presents with  . Palpitations     (Consider location/radiation/quality/duration/timing/severity/associated sxs/prior Treatment) HPI Patient reports that his wife was rubbing his back and he got a sudden onset of racing and fluttering of his heart. He has known heart disease and reports that he is due to get a defibrillator placed this week. He called his cardiologist and reports they instructed him to come the emergency department. He feels that episodes are triggered by certain point in his back as well as gaseous distention of his abdomen. These episodes have come on at rest. He reports that his heart was racing pretty fast and he felt a little lightheaded but did not have a syncopal episode. At this time the symptoms have improved and his heart is no longer racing. He reports that he took 100 mg dose of metoprolol and aspirin. Past Medical History  Diagnosis Date  . Chronic systolic heart failure ([place])   . Edema   . Atrial fibrillation ([place])   . Obesity, unspecified   . Anxiety state, unspecified   . Psychiatric disorder   . Obstructive sleep apnea   . History of medication noncompliance   . Alcohol abuse   . Shortness of breath   . Migraine   . Pulmonary embolism ([place])   . CHF (congestive heart failure) [facility])    Past Surgical History  Procedure Laterality Date  . Testicle surgery    . Cardiac catheterization    . Cardiac catheterization N/A 08/13/2015    Procedure: Right/Left Heart Cath and Coronary Angiography;  Surgeon: [name], MD;  Location: [facility];  Service: Cardiovascular;  Laterality: N/A;   Family History  Problem Relation Age of Onset  . Hypertension      Family History  . Stroke      Family History  . Diabetes      Family History  . Cancer Mother     brain tumor  . Hypertension Mother   .  Diabetes Father     Deceased, 97  . Heart disease Maternal Grandmother   . Diabetes Daughter    Social History  Substance Use Topics  . Smoking status: Current Every Day Smoker -- 0.50 packs/day for 30 years    Types: Cigarettes  . Smokeless tobacco: Never Used     Comment: 10/2014- using patches  . Alcohol Use: 0.0 oz/week    0 Standard drinks or equivalent per week     Comment: nothing to drink since last OV with Dr. Harl Bowie per patient    Review of Systems  10 Systems reviewed and are negative for acute change except as noted in the HPI.   Allergies  Ace inhibitors and Buspirone  Home Medications   Prior to Admission medications   Medication Sig Start Date End Date Taking? Authorizing Provider  aspirin EC 81 MG tablet Take 1 tablet (81 mg total) by mouth daily. 08/01/15  Yes [name], MD  clonazePAM (KLONOPIN) 1 MG tablet TAKE 1 TABLET BY MOUTH 3 TIMES DAILY AS NEEDED 08/27/15  Yes Alycia Rossetti, MD  escitalopram (LEXAPRO) 10 MG tablet Take 1 tablet (10 mg total) by mouth daily. 08/01/15  Yes [name], MD  losartan (COZAAR) 50 MG tablet Take 50 mg by mouth at bedtime.    Yes Historical Provider, MD  Magnesium Oxide 200 MG TABS Take 1 tablet (200 mg total) by mouth daily. 08/01/15  Yes [name], MD  metoprolol succinate (TOPROL-XL) 100 MG 24 hr tablet TAKE 1 TABLET BY MOUTH DAILY WITH OR IMMEDIATELY FOLLOWING A MEAL 10/22/15  Yes Alycia Rossetti, MD  NON FORMULARY Bipap, pressure 16/12 with 2L of O2   Yes Historical Provider, MD  pantoprazole (PROTONIX) 40 MG tablet TAKE 1 TABLET (40 MG TOTAL) BY MOUTH DAILY. 10/29/15  Yes Alycia Rossetti, MD  potassium chloride SA (K-DUR,KLOR-CON) 20 MEQ tablet Take 1 tablet (20 mEq total) by mouth daily. 08/21/15  Yes [name], MD  spironolactone (ALDACTONE) 25 MG tablet TAKE 1 TABLET BY MOUTH DAILY 10/18/14  Yes Alycia Rossetti, MD  thiamine (VITAMIN B-1) 100 MG tablet Take 100 mg by mouth daily.   Yes Historical  Provider, MD  torsemide (DEMADEX) 20 MG tablet Take 1 tablet (20 mg total) by mouth daily. 08/21/15  Yes [name], MD  digoxin (LANOXIN) 0.125 MG tablet Take 1 tablet (0.125 mg total) by mouth daily. Patient not taking: Reported on 11/12/2015 08/21/15   [name], MD  nortriptyline (PAMELOR) 10 MG capsule Take 10 mg by mouth at bedtime.    Historical Provider, MD   BP 109/58 mmHg  Pulse 65  Temp(Src) 98.6 F (37 C) (Oral)  Resp 23  Ht 5\' 8"  (1.727 m)  Wt 427 lb (193.686 kg)  BMI 64.94 kg/m2  SpO2 98% Physical Exam  Constitutional: He is oriented to person, place, and time.  Patient is morbidly obese. He is alert and nontoxic. No respiratory distress at rest.  HENT:  Head: Normocephalic and atraumatic.  Eyes: EOM are normal.  Neck: Neck supple.  Cardiovascular: Normal rate, regular rhythm, normal heart sounds and intact distal pulses.   Pulmonary/Chest: Effort normal and breath sounds normal.  Abdominal: Soft. Bowel sounds are normal. He exhibits no distension. There is no tenderness.  Musculoskeletal: Normal range of motion. He exhibits edema. He exhibits no tenderness.  Patient's lower extremity is a morbidly obese. There is some pitting present.  Neurological: He is alert and oriented to person, place, and time. He has normal strength. He exhibits normal muscle tone. Coordination normal. GCS eye subscore is 4. GCS verbal subscore is 5. GCS motor subscore is 6.  Skin: Skin is warm, dry and intact.  Psychiatric: He has a normal mood and affect.    ED Course  Procedures (including critical care time) Labs Review Labs Reviewed  CBC  BASIC METABOLIC PANEL  Randolm Idol, ED    Imaging Review Dg Chest 2 View  11/12/2015  CLINICAL DATA:  New onset of palpitations and chest pain. Shortness of breath. EXAM: CHEST - 2 VIEW COMPARISON:  CTA chest 07/27/2015 FINDINGS: The heart is mildly enlarged. There is no edema or effusion to suggest failure. Minimal bibasilar  airspace disease likely reflects atelectasis. Multilevel degenerate changes are again noted within the thoracic spine. Fused anterior osteophytes compatible with DISH. IMPRESSION: 1. Cardiomegaly without failure. 2. Mild bibasilar airspace disease likely reflects atelectasis. Electronically Signed   By: San Morelle M.D.   On: 11/12/2015 15:15   I have personally reviewed and evaluated these images and lab results as part of my medical decision-making.   EKG Interpretation   Date/Time:  Monday November 12 2015 14:48:08 EST Ventricular Rate:  71 PR Interval:  165 QRS Duration: 119 QT Interval:  449 QTC Calculation: 488 R Axis:   47 Text  Interpretation:  Sinus rhythm Incomplete left bundle branch block  Confirmed by Johnney Killian, MD, Jeannie Done [phone]) on 11/12/2015 4:51:59 PM     Consult: Cardiology has seen the patient and plans to admit for monitoring and observation. MDM   Final diagnoses:  Paroxysmal atrial fibrillation (HCC)  Palpitations  H/O ventricular tachycardia   Patient describes an episode of rapid heart rate at home. This is resolved prior to arriving in the emergency department. Patient does however have significant history of cardiomyopathy with prior episodes of nonsustained VT as well as atrial fibrillation. Cardiology will evaluate the patient in the emergency department for ongoing management. Patient states he is scheduled for defibrillator placement in 2 days.    Charlesetta Shanks, MD 11/12/15 1655  Charlesetta Shanks, MD 11/12/15 [phone]

## 2015-11-12 NOTE — ED Notes (Signed)
Onset today palpitations, chest pain, and shortness of breath. When patient felt palpitations took metoprolol 100 mg and one tab of 81 mg and EMS provided 3 tabs of 81 mg. Currently chest pain 3/10 soreness.

## 2015-11-13 ENCOUNTER — Encounter: Payer: Self-pay | Admitting: Nurse Practitioner

## 2015-11-13 ENCOUNTER — Ambulatory Visit (HOSPITAL_COMMUNITY): Payer: Medicare Other

## 2015-11-13 ENCOUNTER — Telehealth: Payer: Self-pay | Admitting: Cardiology

## 2015-11-13 DIAGNOSIS — I472 Ventricular tachycardia: Secondary | ICD-10-CM

## 2015-11-13 DIAGNOSIS — R0602 Shortness of breath: Secondary | ICD-10-CM

## 2015-11-13 DIAGNOSIS — R002 Palpitations: Secondary | ICD-10-CM | POA: Insufficient documentation

## 2015-11-13 DIAGNOSIS — I480 Paroxysmal atrial fibrillation: Secondary | ICD-10-CM | POA: Diagnosis not present

## 2015-11-13 DIAGNOSIS — I493 Ventricular premature depolarization: Secondary | ICD-10-CM | POA: Diagnosis not present

## 2015-11-13 DIAGNOSIS — I429 Cardiomyopathy, unspecified: Secondary | ICD-10-CM | POA: Diagnosis not present

## 2015-11-13 DIAGNOSIS — R000 Tachycardia, unspecified: Secondary | ICD-10-CM | POA: Diagnosis not present

## 2015-11-13 LAB — BASIC METABOLIC PANEL
Anion gap: 11 (ref 5–15)
BUN: 15 mg/dL (ref 6–20)
CALCIUM: 8.8 mg/dL — AB (ref 8.9–10.3)
CO2: 25 mmol/L (ref 22–32)
Chloride: 103 mmol/L (ref 101–111)
Creatinine, Ser: 1.14 mg/dL (ref 0.61–1.24)
GFR calc Af Amer: >60 mL/min (ref >60)
GLUCOSE: 174 mg/dL — AB (ref 65–99)
Potassium: 3.7 mmol/L (ref 3.5–5.1)
Sodium: 139 mmol/L (ref 135–145)

## 2015-11-13 LAB — CBC
HEMATOCRIT: 39 % (ref 39.0–52.0)
Hemoglobin: 13.4 g/dL (ref 13.0–17.0)
MCH: 28.8 pg (ref 26.0–34.0)
MCHC: 34.4 g/dL (ref 30.0–36.0)
MCV: 83.7 fL (ref 78.0–100.0)
PLATELETS: 221 10*3/uL (ref 150–400)
RBC: 4.66 MIL/uL (ref 4.22–5.81)
RDW: 13.9 % (ref 11.5–15.5)
WBC: 8.7 10*3/uL (ref 4.0–10.5)

## 2015-11-13 LAB — LIPID PANEL
Cholesterol: 170 mg/dL (ref 0–200)
HDL: 25 mg/dL — ABNORMAL LOW (ref >40)
LDL Cholesterol: UNDETERMINED mg/dL (ref 0–99)
Total CHOL/HDL Ratio: 6.8 RATIO
Triglycerides: 495 mg/dL — ABNORMAL HIGH (ref <150)
VLDL: UNDETERMINED mg/dL (ref 0–40)

## 2015-11-13 MED ORDER — TECHNETIUM TO 99M ALBUMIN AGGREGATED
4.0000 | Freq: Once | INTRAVENOUS | Status: AC | PRN
  Administered 2015-11-13: 4 via INTRAVENOUS

## 2015-11-13 MED ORDER — AMIODARONE HCL 200 MG PO TABS
200.0000 mg | ORAL_TABLET | Freq: Two times a day (BID) | ORAL | Status: DC
  Administered 2015-11-13 – 2015-11-14 (×2): 200 mg via ORAL
  Filled 2015-11-13 (×2): qty 1

## 2015-11-13 MED ORDER — TECHNETIUM TC 99M DIETHYLENETRIAME-PENTAACETIC ACID: 30.0000 | Freq: Once | INTRAVENOUS | Status: DC | PRN

## 2015-11-13 NOTE — Discharge Instructions (Signed)

## 2015-11-13 NOTE — Telephone Encounter (Signed)
°  New Prob   Pt is currently in the hospital and requesting to speak to a nurse regarding getting a work note for his wife. Please call.

## 2015-11-13 NOTE — Progress Notes (Signed)
  Patient seen by EP for consideration for ICD. Not a candidate at this time given his obesity. Patient would be at high risk for complication. EP recommended treatment of PVCs with amiodarone. I've discussed with Dr. Claiborne Billings, rounding MD, and Dr. Aundra Dubin, patient's primary cardiologist, regarding abnormal d-dimer and discharge timing. Per Dr. Aundra Dubin, his d-dimer is minimally elevated. As he is not having worrisome symptoms of recurrent PE, will not plan to anticoagulate at this time. We will plan to start PO amiodarone as advised by EP. Will monitor overnight. Dr. Aundra Dubin to round on tomorrow morning and will determine further plan of care.   SIMMONS, BRITTAINY

## 2015-11-13 NOTE — Consult Note (Signed)
ELECTROPHYSIOLOGY CONSULT NOTE    Patient ID: [name] MRN: [id number], DOB/AGE: 47-May-1967 47 y.o.  Admit date: 11/12/2015 Date of Consult: 11/13/2015  Primary Physician: Vic Blackbird, MD Primary Cardiologist: Aundra Dubin Electrophysiologist: Caryl Comes  Reason for Consultation: evaluate for ICD placement  HPI:  [name] is a 49 y.o. male with a past medical history significant for morbid obesity, non ischemic cardiomyopathy, paroxysmal atrial fibrillation, prior PE 03/2014, tobacco abuse and OSA (on BiPaP).  On the day of admission, he developed tachypalpitations associated with dizziness while sitting. He states that he felt "gassy" and this usually brings on his palpitations. He is unable to tell if they were like his previous episodes of atrial fibrillation.  He presented to the ER for evaluation and was in SR. Telemetry has demonstrated SR with occasional PVC's.  EP has been asked to evaluate for treatment options.  Last echo 08/2015 demonstrated EF 20-25%, mild LVH, grade 1 diastolic dysfunction, LA 54.   He currently states that he is feeling occasional PVC's. He denies recent chest pain, syncope, fevers, chills, nausea or vomiting.  He has stable shortness of breath  He is very sedentary at home and asks about a natural diet supplement to help lose weight. He is willing to consider bariatric surgery.   Past Medical History  Diagnosis Date  . Chronic systolic heart failure ([place])   . Edema   . Atrial fibrillation ([place])   . Obesity, unspecified   . Anxiety state, unspecified   . Psychiatric disorder   . Obstructive sleep apnea   . History of medication noncompliance   . Alcohol abuse   . Shortness of breath   . Migraine   . Pulmonary embolism ([place])   . CHF (congestive heart failure) [facility])      Surgical History:  Past Surgical History  Procedure Laterality Date  . Testicle surgery    . Cardiac catheterization    . Cardiac catheterization N/A 08/13/2015   Procedure: Right/Left Heart Cath and Coronary Angiography;  Surgeon: [name], MD;  Location: [place] [facility];  Service: Cardiovascular;  Laterality: N/A;     Prescriptions prior to admission  Medication Sig Dispense Refill Last Dose  . aspirin EC 81 MG tablet Take 1 tablet (81 mg total) by mouth daily. 90 tablet 3 11/12/2015 at Unknown time  . clonazePAM (KLONOPIN) 1 MG tablet TAKE 1 TABLET BY MOUTH 3 TIMES DAILY AS NEEDED 90 tablet 2 11/12/2015 at Unknown time  . escitalopram (LEXAPRO) 10 MG tablet Take 1 tablet (10 mg total) by mouth daily. 30 tablet 3 11/12/2015 at Unknown time  . losartan (COZAAR) 50 MG tablet Take 50 mg by mouth at bedtime.    11/11/2015 at Unknown time  . Magnesium Oxide 200 MG TABS Take 1 tablet (200 mg total) by mouth daily. 30 tablet 3 11/12/2015 at Unknown time  . metoprolol succinate (TOPROL-XL) 100 MG 24 hr tablet TAKE 1 TABLET BY MOUTH DAILY WITH OR IMMEDIATELY FOLLOWING A MEAL 30 tablet 0 11/12/2015 at 1200  . NON FORMULARY Bipap, pressure 16/12 with 2L of O2   unknown at unknown  . pantoprazole (PROTONIX) 40 MG tablet TAKE 1 TABLET (40 MG TOTAL) BY MOUTH DAILY. 30 tablet 0 11/12/2015 at Unknown time  . potassium chloride SA (K-DUR,KLOR-CON) 20 MEQ tablet Take 1 tablet (20 mEq total) by mouth daily.   11/12/2015 at Unknown time  . spironolactone (ALDACTONE) 25 MG tablet TAKE 1 TABLET BY MOUTH DAILY 90 tablet 6 11/12/2015  at Unknown time  . thiamine (VITAMIN B-1) 100 MG tablet Take 100 mg by mouth daily.   Past Week at Unknown time  . torsemide (DEMADEX) 20 MG tablet Take 1 tablet (20 mg total) by mouth daily.   11/12/2015 at Unknown time  . digoxin (LANOXIN) 0.125 MG tablet Take 1 tablet (0.125 mg total) by mouth daily. (Patient not taking: Reported on 11/12/2015) 90 tablet 3 Not Taking at Unknown time  . nortriptyline (PAMELOR) 10 MG capsule Take 10 mg by mouth at bedtime.   Not Taking at Unknown time    Inpatient Medications:  . aspirin EC  81 mg Oral  Daily  . clonazePAM  0.5 mg Oral QHS  . escitalopram  10 mg Oral Daily  . losartan  50 mg Oral QHS  . magnesium oxide  200 mg Oral Daily  . metoprolol succinate  100 mg Oral Daily  . pantoprazole  40 mg Oral Daily  . potassium chloride SA  20 mEq Oral Daily  . spironolactone  25 mg Oral Daily  . thiamine  100 mg Oral Daily  . torsemide  20 mg Oral Daily    Allergies:  Allergies  Allergen Reactions  . Ace Inhibitors Anaphylaxis and Swelling  . Buspirone Other (See Comments)    dizziness    Social History   Social History  . Marital Status: Married    Spouse Name: N/A  . Number of Children: 3  . Years of Education: N/A   Occupational History  . DISABLED    Social History Main Topics  . Smoking status: Current Every Day Smoker -- 0.50 packs/day for 30 years    Types: Cigarettes  . Smokeless tobacco: Never Used     Comment: 10/2014- using patches  . Alcohol Use: 0.0 oz/week    0 Standard drinks or equivalent per week     Comment: nothing to drink since last OV with Dr. Harl Bowie per patient  . Drug Use: No  . Sexual Activity: Yes   Other Topics Concern  . Not on file   Social History Narrative   He smokes about a pack per day and he has been smoking since he was 49 years of age.  He drinks alcohol occasionally, but he denies any illicit drug abuse.  He is presently on disability.    Lives with wife in a 2 story home.  Has 2 children.   Previously worked in [employer], last worked in 1998.   Highest level of education:  11th grade             Family History  Problem Relation Age of Onset  . Hypertension      Family History  . Stroke      Family History  . Diabetes      Family History  . Cancer Mother     brain tumor  . Hypertension Mother   . Diabetes Father     Deceased, 60  . Heart disease Maternal Grandmother   . Diabetes Daughter      Review of Systems: All other systems reviewed and are otherwise negative except as noted above.  Physical  Exam: Filed Vitals:   11/12/15 1845 11/12/15 1945 11/12/15 2256 11/13/15 0456  BP: 131/80 127/71  114/67  Pulse: 64 66  63  Temp:  98.3 F (36.8 C)  98 F (36.7 C)  TempSrc:  Oral  Oral  Resp: 15 18 18 18   Height:  5\' 8"  (1.727 m)    Weight:  425 lb 3.2 oz (192.869 kg)  420 lb (190.511 kg)  SpO2: 98% 100%  98%    GEN- The patient is massively obese, alert and oriented x 3 today.   HEENT: normocephalic, atraumatic; sclera clear, conjunctiva pink; hearing intact; oropharynx clear; neck supple  Lungs- Clear to ausculation bilaterally, normal work of breathing.  No wheezes, rales, rhonchi Heart- Regular rate and rhythm, distant heart sounds GI- soft, non-tender, non-distended, bowel sounds present  Extremities- no clubbing, cyanosis, or edema  MS- no significant deformity or atrophy Skin- warm and dry, no rash or lesion Psych- euthymic mood, full affect Neuro- strength and sensation are intact  Labs:   Lab Results  Component Value Date   WBC 8.7 11/13/2015   HGB 13.4 11/13/2015   HCT 39.0 11/13/2015   MCV 83.7 11/13/2015   PLT 221 11/13/2015    Recent Labs Lab 11/13/15 0526  NA 139  K 3.7  CL 103  CO2 25  BUN 15  CREATININE 1.14  CALCIUM 8.8*  GLUCOSE 174*      Radiology/Studies: Dg Chest 2 View 11/12/2015  CLINICAL DATA:  New onset of palpitations and chest pain. Shortness of breath. EXAM: CHEST - 2 VIEW COMPARISON:  CTA chest 07/27/2015 FINDINGS: The heart is mildly enlarged. There is no edema or effusion to suggest failure. Minimal bibasilar airspace disease likely reflects atelectasis. Multilevel degenerate changes are again noted within the thoracic spine. Fused anterior osteophytes compatible with DISH. IMPRESSION: 1. Cardiomegaly without failure. 2. Mild bibasilar airspace disease likely reflects atelectasis. Electronically Signed   By: San Morelle M.D.   On: 11/12/2015 15:15    [id number] rhythm, rate 71  TELEMETRY: sinus rhythm, occasional  PVC's  Assessment/Plan: 1.  Non ischemic cardiomyopathy The patient has a non ischemic cardiomyopathy despite guideline directed therapy.   While he qualifies for primary prevention ICD, his morbid obesity makes procedures prohibitive at this time.  2.  Palpitations Could be related to PVC's or atrial fibrillation. We only have PVC's documented this admission.  Could consider uptitration of BB or addition of amiodarone for suppression  3.  Morbid obesity Body mass index is 63.88 kg/(m^2). He is willing to consider bariatric surgery and understands that without significant lifestyle changes and weight loss his long term prognosis is very poor Will discuss with [name]  4.  Paroxysmal atrial fibrillation CHADS2VASC is 1 Not currently on [facility]  Dr Rayann Heman to see later today  Signed, Chanetta Marshall, NP 11/13/2015 11:17 AM  I have seen, examined the patient, and reviewed the above assessment and plan.  On exam, morbidly obese, NAD.  Changes to above are made where necessary.  Very difficult situation.  With weight of 425 lbs, he is at very high risks of any procedures.  I do not feel that I could safely sedate patient or implant an ICD.  Data for ICD implantation in the massively obese population is lacking.  I have advised major lifestyle modification.  I have also recommended that he establish with Dr Hassell Done in the bariatric surgery program.  Without significant weight reduction and lifestyle modification I feel that his mortality is very high.  I do not feel that this overall mortality would be reduced with a defibrillator implanted at this time.   Will add amiodarone for afib/ NSVT management.  Follow-up with Dr Caryl Comes in the office for further EP management.  Electrophysiology team to see as needed while here. Please call with questions.   Co Sign: Thompson Grayer, MD 11/13/2015  2:49 PM

## 2015-11-13 NOTE — Progress Notes (Signed)
Patient is up in the bedside reclining chair, his 47 year old son is asleep in the patient's bariatric bed. Asked the patient if there is a family member who could be contacted to come and be with is son - that he/ the patient will be leaving the floor soon for a procedure. He stated family was suppose to be here by now. Stated he would call "them" again. Stated he had told his son he would not need to go to school today. Was dialing with his cell phone when this RN left the patient's room.

## 2015-11-13 NOTE — Care Management Obs Status (Signed)
[place] NOTIFICATION   Patient Details  Name: [name] MRN: [id number] Date of Birth: 1966-01-12   Medicare Observation Status Notification Given:  Yes    Maryclare Labrador, RN 11/13/2015, 4:14 PM

## 2015-11-13 NOTE — Progress Notes (Addendum)
Patient Profile: [name] is a 49 y.o. male with a history of [place] with EF of 20-25% (initialy turned down ICD), remote hx PAF, hx of PE 03/2014 for which he took Rivaroxaban for a year (now off), current tobacco smoker and h/o OSA, who came to ED for evaluation of tachy palpitations.   Subjective: Still with intermittent tachy palpitations, lasting only a few seconds. Also with occasional "indigestion". No significant dyspnea.  Currently asymptomatic. Denies syncope/ near syncope.   Objective: Vital signs in last 24 hours: Temp:  [98 F (36.7 C)-98.6 F (37 C)] 98 F (36.7 C) (12/13 0456) Pulse Rate:  [62-71] 63 (12/13 0456) Resp:  [12-23] 18 (12/13 0456) BP: (109-131)/(58-89) 114/67 mmHg (12/13 0456) SpO2:  [97 %-100 %] 98 % (12/13 0456) Weight:  [420 lb (190.511 kg)-427 lb (193.686 kg)] 420 lb (190.511 kg) (12/13 0456) Last BM Date: 11/12/15  Intake/Output from previous day: 12/12 0701 - 12/13 0700 In: -  Out: 750 [Urine:750] Intake/Output this shift:    Medications Current Facility-Administered Medications  Medication Dose Route Frequency Provider Last Rate Last Dose  . acetaminophen (TYLENOL) tablet 650 mg  650 mg Oral Q4H PRN Bhavinkumar Bhagat, PA      . aspirin EC tablet 81 mg  81 mg Oral Daily Bhavinkumar Bhagat, PA      . clonazePAM (KLONOPIN) tablet 0.5 mg  0.5 mg Oral QHS Bhavinkumar Bhagat, PA   0.5 mg at 11/12/15 2210  . escitalopram (LEXAPRO) tablet 10 mg  10 mg Oral Daily Bhavinkumar Bhagat, PA      . losartan (COZAAR) tablet 50 mg  50 mg Oral QHS Bhavinkumar Bhagat, PA   50 mg at 11/12/15 2210  . magnesium oxide (MAG-OX) tablet 200 mg  200 mg Oral Daily Bhavinkumar Bhagat, PA      . metoprolol succinate (TOPROL-XL) 24 hr tablet 100 mg  100 mg Oral Daily Bhavinkumar Bhagat, PA      . ondansetron (ZOFRAN) injection 4 mg  4 mg Intravenous Q6H PRN Bhavinkumar Bhagat, PA      . pantoprazole (PROTONIX) EC tablet 40 mg  40 mg Oral Daily Bhavinkumar Bhagat, PA       . potassium chloride SA (K-DUR,KLOR-CON) CR tablet 20 mEq  20 mEq Oral Daily Bhavinkumar Bhagat, PA   20 mEq at 11/12/15 2210  . spironolactone (ALDACTONE) tablet 25 mg  25 mg Oral Daily Bhavinkumar Bhagat, PA      . thiamine (VITAMIN B-1) tablet 100 mg  100 mg Oral Daily Bhavinkumar Bhagat, PA      . torsemide (DEMADEX) tablet 20 mg  20 mg Oral Daily Bhavinkumar Bhagat, PA        PE: General appearance: alert, cooperative, no distress and morbidly obese Neck: no carotid bruit and no JVD Lungs: clear to auscultation bilaterally Heart: regular rate and rhythm, S1, S2 normal, no murmur, click, rub or gallop Extremities: no LEE Pulses: 2+ and symmetric Skin: Skin color, texture, turgor normal. No rashes or lesions Neurologic: Grossly normal  Lab Results:   Recent Labs  11/12/15 1633 11/13/15 0526  WBC 8.1 8.7  HGB 14.4 13.4  HCT 41.1 39.0  PLT 216 221   BMET  Recent Labs  11/12/15 1633 11/13/15 0526  NA 139 139  K 3.8 3.7  CL 105 103  CO2 28 25  GLUCOSE 189* 174*  BUN 12 15  CREATININE 1.09 1.14  CALCIUM 9.1 8.8*   PT/INR No results for input(s): LABPROT, INR in the  last 72 hours. Cholesterol  Recent Labs  11/13/15 0526  CHOL 170   Cardiac Panel (last 3 results) No results for input(s): CKTOTAL, CKMB, TROPONINI, RELINDX in the last 72 hours.  D-dimer 0.69  BNP    Component Value Date/Time   BNP 77.8 11/12/2015 2041    ProBNP    Component Value Date/Time   PROBNP 451.6* 05/24/2014 0346     Assessment/Plan  Active Problems:   Palpitation   1. Chronic systolic heart failure/nonischemic cardiomyopathy - most recent echo 9/16 demonstrated ejection fraction of 20-25% with moderate LAE. Cath 08/2015 showed normal filling pressures and no CAD. He was evaluated by [name] for possible ICD placement, the patient has initially declined however called office 2 days ago and has a upcoming appointment with [name] 12/14 for reconsideration of ICD  placement. Previously it was felt that his cardiomyopathy may be due to alcohol abuse, he has quit. However, used 2 beers and 3 shots of liquor 2 days ago. -  BNP is WNL. He appears euvolemic. Will get EP evaluation during admission for ICD placement. - Continue losartan, metoprolol, spironolactone and torsemide  2. Hx of PAF/flutter - CHADSVASCS score of 1 for CHF.  - Presented with tachy palpitationas, no documented A. fib/A flutter. PVCs noted on telemetry.  - Continue beta blocker  3. Tobacco abuse - Encouraged cessation. Education given.   4.Alcohol abuse - previoulsy quit.   6. History of pulmonary embolism 03/2014 - diagnosed by V/Q scan. He was on Xarelto for about a year but stopped in the setting of hematuria. CTA of chest in 8/16 did not show a PE. D-dimer now elevated at 0.69. ? Repeat imaging study. Will defer to MD.  Will hold restarting anticoagulation until seen by EP, as he may receive ICD implant this admission.    Brittainy M. Ladoris Gene 11/13/2015 8:41 AM   Patient seen and examined. Agree with assessment and plan. Feels better but notes vague chest sensation. PVC's on telemetry.  Pt developed a hematoma left fore-arm with iv ? Cause of minimal d-dimer elevation with mild clot. I notified [name] (who had seen the patient in October) last night of patient's admission. I again discussed ICD rationale. EP to see this am.    Troy Sine, MD, [facility] 11/13/2015 9:01 AM

## 2015-11-13 NOTE — Care Management Note (Signed)
Case Management Note  Patient Details  Name: [name] MRN: [id number] Date of Birth: [birth year]  Subjective/Objective:    Pt admitted with palpitation                Action/Plan:  Pt is independent from home with wife.  CM will continue to monitor for disposition needs   Expected Discharge Date:                  Expected Discharge Plan:  Home/Self Care  In-House Referral:     Discharge planning Services  CM Consult  Post Acute Care Choice:    Choice offered to:     DME Arranged:    DME Agency:     HH Arranged:    HH Agency:     Status of Service:  In process, will continue to follow  Medicare Important Message Given:    Date Medicare IM Given:    Medicare IM give by:    Date Additional Medicare IM Given:    Additional Medicare Important Message give by:     If discussed at [place] of Stay Meetings, dates discussed:    Additional Comments:  Maryclare Labrador, RN 11/13/2015, 3:37 PM

## 2015-11-13 NOTE — Progress Notes (Addendum)
MD Meda Coffee notified of pt ddimer results.  Pt also requests bipap for sleep and says he uses it at home every night.  New orders received.  Will cont to monitor pt closely.  Claudette Stapler, RN

## 2015-11-14 ENCOUNTER — Ambulatory Visit: Payer: Medicare Other | Admitting: Internal Medicine

## 2015-11-14 ENCOUNTER — Telehealth (HOSPITAL_COMMUNITY): Payer: Self-pay | Admitting: *Deleted

## 2015-11-14 ENCOUNTER — Encounter: Payer: Self-pay | Admitting: *Deleted

## 2015-11-14 ENCOUNTER — Other Ambulatory Visit (HOSPITAL_COMMUNITY): Payer: Self-pay | Admitting: Student

## 2015-11-14 ENCOUNTER — Ambulatory Visit (INDEPENDENT_AMBULATORY_CARE_PROVIDER_SITE_OTHER): Payer: Medicare Other

## 2015-11-14 DIAGNOSIS — I493 Ventricular premature depolarization: Secondary | ICD-10-CM

## 2015-11-14 DIAGNOSIS — I5022 Chronic systolic (congestive) heart failure: Secondary | ICD-10-CM

## 2015-11-14 DIAGNOSIS — I480 Paroxysmal atrial fibrillation: Secondary | ICD-10-CM | POA: Diagnosis not present

## 2015-11-14 DIAGNOSIS — R002 Palpitations: Secondary | ICD-10-CM | POA: Diagnosis not present

## 2015-11-14 DIAGNOSIS — R42 Dizziness and giddiness: Secondary | ICD-10-CM | POA: Diagnosis not present

## 2015-11-14 MED ORDER — AMIODARONE HCL 200 MG PO TABS: ORAL_TABLET | ORAL | Status: DC

## 2015-11-14 MED ORDER — FENOFIBRATE 54 MG PO TABS: 54.0000 mg | ORAL_TABLET | Freq: Every day | ORAL | Status: DC

## 2015-11-14 MED ORDER — LOSARTAN POTASSIUM 25 MG PO TABS: 75.0000 mg | ORAL_TABLET | Freq: Every day | ORAL | Status: DC

## 2015-11-14 MED ORDER — FENOFIBRATE 54 MG PO TABS
54.0000 mg | ORAL_TABLET | Freq: Every day | ORAL | Status: DC
  Filled 2015-11-14: qty 1

## 2015-11-14 NOTE — Discharge Summary (Signed)
Advanced Heart Failure Team  Discharge Summary   Patient ID: [name] MRN: [id number], DOB/AGE: 47-Jul-1967 47 y.o. Admit date: 11/12/2015 D/C date:     11/14/2015   Primary Discharge Diagnoses:  1. Palpitations - started on amio for possible VT vs afib. Will get 30 day event monitor 2. Morbid obesity - refer to bariatric clinic at [facility] long 3. Chronic systolic CHF EF 0000000 - stable. Morbid obesity currently prohibitive for ICD placement. 4. Paroxysmal afib - none documented on tele this admission.  5. Hx of PE - Off Xarelto with hematuria.  6. Hypertriglyceridemia  Consults: EP 11/13/15  Hospital Course:   [name] is a 49 y.o. male with hx of NCIM, remote hx PAF, hx of PE 03/2014 for which took Rivaroxaban for year (now off), current tobacco smoker, OSA, who presented to [facility] for evaluation of palpitations x 1 day.  Of note, he is followed in the HF clinic and at last visit had refused ICD placement for low EF, but is scheduled for follow up with Dr Caryl Comes for reconsideration.    He did report alcohol abuse (previously had reported cessation) in the days leading up to his palpitations, but no other known inciting factors. 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 Placed on telemetry which showed only PVCs for the duration of his stay.  Pt also started on amiodarone for ?VT vs afib at home.  No anticoagulation started with no documented afib. Pt has distant hx of afib and was also on Xarelto for a previous PE, though had to stop 2/2 hematuria.  No PE by CTA in 07/2015 and no suggestive symptoms this admission.  He will be placed on a 30 day event monitor on discharge.   He was seen by Dr Rayann Heman 11/13/15 for further evaluation for ICD, but unfortunately his marked obesity is prohibitive at this time.  Has agreed to follow up in bariatric clinic.   Volume status was stable.  No changes were made to his home diuretic regimen.  His triglycerides were noted to  be markedly elevated this admission, so he will be started on Fenofibrate. He had not been taking digoxin with side effects, so will keep off.   Overall, weight showed up 3 lbs despite 1.1 L diuresis on home torsemide.  He will be discharged to home in stable condition with follow up in the HF clinic as below.  He will also see Dr Caryl Comes in January.  Pt will stop at [facility] after discharge to have 30 day event monitor placed.   Discharge Weight : 428 lbs Discharge Vitals: Blood pressure 117/87, pulse 54, temperature 97.7 F (36.5 C), temperature source Oral, resp. rate 20, height 5\' 8"  (1.727 m), weight 428 lb 5.7 oz (194.3 kg), SpO2 100 %.  Labs: Lab Results  Component Value Date   WBC 8.7 11/13/2015   HGB 13.4 11/13/2015   HCT 39.0 11/13/2015   MCV 83.7 11/13/2015   PLT 221 11/13/2015    Recent Labs Lab 11/13/15 0526  NA 139  K 3.7  CL 103  CO2 25  BUN 15  CREATININE 1.14  CALCIUM 8.8*  GLUCOSE 174*   Lab Results  Component Value Date   CHOL 170 11/13/2015   HDL 25* 11/13/2015   LDLCALC UNABLE TO CALCULATE IF TRIGLYCERIDE OVER 400 mg/dL 11/13/2015   TRIG 495* 11/13/2015   BNP (last 3 results)  Recent Labs  02/22/15 1100 07/27/15 2329 11/12/15 2041  BNP 36.7 43.0 77.8    ProBNP (last 3 results) No results for input(s): PROBNP in the last 8760 hours.   Diagnostic Studies/Procedures   Dg Chest 2 View  11/12/2015  CLINICAL DATA:  New onset of palpitations and chest pain. Shortness of breath. EXAM: CHEST - 2 VIEW COMPARISON:  CTA chest 07/27/2015 FINDINGS: The heart is mildly enlarged. There is no edema or effusion to suggest failure. Minimal bibasilar airspace disease likely reflects atelectasis. Multilevel degenerate changes are again noted within the thoracic spine. Fused anterior osteophytes compatible with DISH. IMPRESSION: 1. Cardiomegaly without failure. 2. Mild bibasilar airspace disease likely reflects atelectasis. Electronically Signed   By:  San Morelle M.D.   On: 11/12/2015 15:15   Nm Pulmonary Perf And Vent  11/13/2015  CLINICAL DATA:  Tachycardia. History of pulmonary embolus and May 2015. Current smoker. EXAM: NUCLEAR MEDICINE VENTILATION - PERFUSION LUNG SCAN TECHNIQUE: Ventilation images were obtained in multiple projections using inhaled aerosol Tc-106m DTPA. Perfusion images were obtained in multiple projections after intravenous injection of Tc-31m MAA. RADIOPHARMACEUTICALS:  4.1 millicuries of AB-123456789 DTPA aerosol inhalation and 123456 millicuries of AB-[id number] MAA IV COMPARISON:  Chest radiograph, 11/12/2015.  Chest CTA, 07/27/2015. FINDINGS: Ventilation: No focal ventilation defect. Perfusion: No wedge shaped peripheral perfusion defects to suggest acute pulmonary embolism. IMPRESSION: Normal ventilation-perfusion study. No evidence of pulmonary thromboembolism. Electronically Signed   By: Lajean Manes M.D.   On: 11/13/2015 14:13    Discharge Medications     Medication List    STOP taking these medications        digoxin 0.125 MG tablet  Commonly known as:  LANOXIN      TAKE these medications        amiodarone 200 MG tablet  Commonly known as:  PACERONE  Take 1 tablet (200 mg total) twice daily for 1 week, then decrease to 1 tablet (200 mg) daily on 11/21/15     aspirin EC 81 MG tablet  Take 1 tablet (81 mg total) by mouth daily.     clonazePAM 1 MG tablet  Commonly known as:  KLONOPIN  TAKE 1 TABLET BY MOUTH 3 TIMES DAILY AS NEEDED     escitalopram 10 MG tablet  Commonly known as:  LEXAPRO  Take 1 tablet (10 mg total) by mouth daily.     fenofibrate 54 MG tablet  Take 1 tablet (54 mg total) by mouth daily.     losartan 25 MG tablet  Commonly known as:  COZAAR  Take 3 tablets (75 mg total) by mouth at bedtime.     Magnesium Oxide 200 MG Tabs  Take 1 tablet (200 mg total) by mouth daily.     metoprolol succinate 100 MG 24 hr tablet  Commonly known as:  TOPROL-XL  TAKE 1 TABLET  BY MOUTH DAILY WITH OR IMMEDIATELY FOLLOWING A MEAL     NON FORMULARY  Bipap, pressure 16/12 with 2L of O2     nortriptyline 10 MG capsule  Commonly known as:  PAMELOR  Take 10 mg by mouth at bedtime.     pantoprazole 40 MG tablet  Commonly known as:  PROTONIX  TAKE 1 TABLET (40 MG TOTAL) BY MOUTH DAILY.     potassium chloride SA 20 MEQ tablet  Commonly known as:  K-DUR,KLOR-CON  Take 1 tablet (20 mEq total) by mouth daily.     spironolactone 25 MG tablet  Commonly known as:  ALDACTONE  TAKE 1 TABLET BY MOUTH DAILY  thiamine 100 MG tablet  Commonly known as:  VITAMIN B-1  Take 100 mg by mouth daily.     torsemide 20 MG tablet  Commonly known as:  DEMADEX  Take 1 tablet (20 mg total) by mouth daily.        Disposition   The patient will be discharged in stable condition to home.     Discharge Instructions    Diet - low sodium heart healthy    Complete by:  As directed      Heart Failure patients record your daily weight using the same scale at the same time of day    Complete by:  As directed      Increase activity slowly    Complete by:  As directed           Follow-up Information    Follow up with Virl Axe, MD On 12/05/2015.   Specialty:  Cardiology   Why:  at 9:45AM   Contact information:   [address] [address] [phone]       Follow up with [name], MD On 12/10/2015.   Specialty:  Cardiology   Why:  at 1120 for post hospital appointment. Please bring all of your medications to your visit.  The code for pt parking is 1000.   Contact information:   [place] [address] [phone]       Follow up with [facility] Today.   Specialty:  Cardiology   Why:  at 1230 for Event monitor placement.    Contact information:   [address], [place] [phone]        Duration of Discharge Encounter: Greater than 35  minutes   Signed, Shirley Friar PA-C 11/14/2015, 10:11 AM

## 2015-11-14 NOTE — Progress Notes (Signed)
Patient ID: [name], male   DOB: [birth year], 49 y.o.   MRN: [id number] Patient rescheduled for 30 day cardiac event monitor to 4:00 PM 11/14/2015.

## 2015-11-14 NOTE — Progress Notes (Signed)
Patient ID: [name], male   DOB: 1966/09/27, 49 y.o.   MRN: [id number]   SUBJECTIVE: Occasional palpitations, otherwise ok.  Will not be getting ICD.  Scheduled Meds: . amiodarone  200 mg Oral BID  . aspirin EC  81 mg Oral Daily  . clonazePAM  0.5 mg Oral QHS  . escitalopram  10 mg Oral Daily  . losartan  75 mg Oral QHS  . magnesium oxide  200 mg Oral Daily  . metoprolol succinate  100 mg Oral Daily  . pantoprazole  40 mg Oral Daily  . potassium chloride SA  20 mEq Oral Daily  . spironolactone  25 mg Oral Daily  . thiamine  100 mg Oral Daily  . torsemide  20 mg Oral Daily   Continuous Infusions:  PRN Meds:.acetaminophen, ondansetron (ZOFRAN) IV, technetium TC 49M diethylenetriame-pentaacetic acid    Filed Vitals:   11/13/15 0456 11/13/15 1612 11/13/15 2137 11/14/15 0444  BP: 114/67 140/59 138/71 117/87  Pulse: 63 63 65 54  Temp: 98 F (36.7 C) 98.1 F (36.7 C) 98.6 F (37 C) 97.7 F (36.5 C)  TempSrc: Oral Oral Oral Oral  Resp: 18 18 18 20   Height:      Weight: 420 lb (190.511 kg)   428 lb 5.7 oz (194.3 kg)  SpO2: 98% 100% 100% 100%    Intake/Output Summary (Last 24 hours) at 11/14/15 0902 Last data filed at 11/13/15 1630  Gross per 24 hour  Intake    720 ml  Output   1150 ml  Net   -430 ml    LABS: Basic Metabolic Panel:  Recent Labs  11/12/15 1633 11/13/15 0526  NA 139 139  K 3.8 3.7  CL 105 103  CO2 28 25  GLUCOSE 189* 174*  BUN 12 15  CREATININE 1.09 1.14  CALCIUM 9.1 8.8*   Liver Function Tests: No results for input(s): AST, ALT, ALKPHOS, BILITOT, PROT, ALBUMIN in the last 72 hours. No results for input(s): LIPASE, AMYLASE in the last 72 hours. CBC:  Recent Labs  11/12/15 1633 11/13/15 0526  WBC 8.1 8.7  HGB 14.4 13.4  HCT 41.1 39.0  MCV 83.2 83.7  PLT 216 221   Cardiac Enzymes: No results for input(s): CKTOTAL, CKMB, CKMBINDEX, TROPONINI in the last 72 hours. BNP: Invalid input(s): POCBNP D-Dimer:  Recent Labs   11/12/15 2041  DDIMER 0.69*   Hemoglobin A1C: No results for input(s): HGBA1C in the last 72 hours. Fasting Lipid Panel:  Recent Labs  11/13/15 0526  CHOL 170  HDL 25*  LDLCALC UNABLE TO CALCULATE IF TRIGLYCERIDE OVER 400 mg/dL  TRIG 495*  CHOLHDL 6.8   Thyroid Function Tests:  Recent Labs  11/12/15 2041  TSH 1.788   Anemia Panel: No results for input(s): VITAMINB12, FOLATE, FERRITIN, TIBC, IRON, RETICCTPCT in the last 72 hours.  RADIOLOGY: Dg Chest 2 View  11/12/2015  CLINICAL DATA:  New onset of palpitations and chest pain. Shortness of breath. EXAM: CHEST - 2 VIEW COMPARISON:  CTA chest 07/27/2015 FINDINGS: The heart is mildly enlarged. There is no edema or effusion to suggest failure. Minimal bibasilar airspace disease likely reflects atelectasis. Multilevel degenerate changes are again noted within the thoracic spine. Fused anterior osteophytes compatible with DISH. IMPRESSION: 1. Cardiomegaly without failure. 2. Mild bibasilar airspace disease likely reflects atelectasis. Electronically Signed   By: San Morelle M.D.   On: 11/12/2015 15:15   Nm Pulmonary Perf And Vent  11/13/2015  CLINICAL DATA:  Tachycardia. History  of pulmonary embolus and May 2015. Current smoker. EXAM: NUCLEAR MEDICINE VENTILATION - PERFUSION LUNG SCAN TECHNIQUE: Ventilation images were obtained in multiple projections using inhaled aerosol Tc-84m DTPA. Perfusion images were obtained in multiple projections after intravenous injection of Tc-58m MAA. RADIOPHARMACEUTICALS:  4.1 millicuries of AB-123456789 DTPA aerosol inhalation and 123456 millicuries of [id number] MAA IV COMPARISON:  Chest radiograph, 11/12/2015.  Chest CTA, 07/27/2015. FINDINGS: Ventilation: No focal ventilation defect. Perfusion: No wedge shaped peripheral perfusion defects to suggest acute pulmonary embolism. IMPRESSION: Normal ventilation-perfusion study. No evidence of pulmonary thromboembolism. Electronically Signed   By:  Lajean Manes M.D.   On: 11/13/2015 14:13    PHYSICAL EXAM General: NAD, obese Neck: No JVD, no thyromegaly or thyroid nodule.  Lungs: Clear to auscultation bilaterally with normal respiratory effort. CV: Nondisplaced PMI.  Heart regular S1/S2, no S3/S4, no murmur. Trace ankle edema.   Abdomen: Soft, nontender, no hepatosplenomegaly, no distention.  Neurologic: Alert and oriented x 3.  Psych: Normal affect. Extremities: No clubbing or cyanosis.   TELEMETRY: Reviewed telemetry pt in NSR with PVCs  ASSESSMENT AND PLAN: 49 yo with history of nonischemic cardiomyopathy was admitted with palpitations.  1. Palpitations:  Prolonged episodes at home.  Telemetry here has shown only PVCs.  He has remote history of atrial fibrillation but this has not been detected.  He was seen by EP.  EF is low, there was initial plan for ICD but was seen by Dr Rayann Heman and given marked obesity, decided against ICD (doubted it would be of significant benefit) . He was started on amiodarone for ?VT versus atrial fibrillation at home. - Continue amiodarone 200 mg bid x 1 week then 200 mg daily.  Will need to follow LFTs/TSH. - He was not anticoagulated as no atrial fibrillation has been documented yet.  Would have him wear 30 day monitor at discharge.  2. Morbid obesity: Discussion about this today.  Will refer him to bariatric program at [facility].  3. Chronic systolic CHF: Nonischemic cardiomyopathy. NYHA class III. Echo (9/16) with EF 20-25%. He does not appear volume overloaded today. - Continue current Toprol XL and spironolactone.  - Increase losartan to 75 mg daily. He had angioedema with ACEI, but the risk of angioedema in this situation with ARB use is low and he has tolerated it so far.  - OK to continue taking Torsemide 20 daily as he has been doing. Continue KCl 20 daily.  - Not taking digoxin due to side effects. 4. Paroxysmal atrial fibrillation: Remote. He is no longer anticoagulated. Will  restart anticoagulation if atrial fibrillation noted on 30 day monitor.  5. PE: 5/15, diagnosed by V/Q scan. He was on Xarelto for about a year but stopped it due to hematuria. CTA chest in 8/16 did not show a PE.  D dimer was mildly elevated this admission but no symptoms suggestive of PE.  6. Hypertriglyceridemia: TGs very high this admission, add fenofibrate 54 mg daily.  7. Disposition: Home today.  He will need 30 day monitor. Provide him with information on bariatric program.  Followup CHF clinic 2-3 weeks.  Meds: Amiodarone 200 mg bid x 1 week then 200 mg daily, Toprol XL 100 daily, torsemide 20 daily, KCl 20 daily, spironolactone 25 daily, losartan 75 daily, fenofibrate 54 daily.   [name] 11/14/2015 9:11 AM

## 2015-11-14 NOTE — Progress Notes (Signed)
Patient ID: [name], male   DOB: December 11, 1965, 49 y.o.   MRN: [id number] Patient did not show up for 11/14/2015 12:30 PM appointment to have a 30 day cardiac event monitor applied.

## 2015-11-14 NOTE — Telephone Encounter (Signed)
This was addressed in the hospital

## 2015-11-14 NOTE — Care Management Note (Signed)
Case Management Note  Patient Details  Name: [name] MRN: [id number] Date of Birth: [birth year]  Subjective/Objective:    Pt admitted with palpitation                Action/Plan:  Pt is independent from home with wife.  CM will continue to monitor for disposition needs   Expected Discharge Date:                  Expected Discharge Plan:  Home/Self Care  In-House Referral:     Discharge planning Services  CM Consult  Post Acute Care Choice:    Choice offered to:     DME Arranged:    DME Agency:     HH Arranged:    [place] Agency:     Status of Service:  Complete, will sign off  Medicare Important Message Given:    Date Medicare IM Given:    Medicare IM give by:    Date Additional Medicare IM Given:    Additional Medicare Important Message give by:     If discussed at [place] of Stay Meetings, dates discussed:    Additional Comments: Pt will discharge home today 11/14/15 Maryclare Labrador, RN 11/14/2015, 10:14 AM

## 2015-11-14 NOTE — Telephone Encounter (Signed)
Pt called very upset and concerned about his Amiodarone.  He states he was suppose to be started on this in the hospital last night to be monitored while starting as it is a new med for him.  However he states today before he left the hospital the RN came in to give him the Amio and he states it was a big, round yellow pill and that he had never been given this pill the day before.  He states he even called the charge nurse in to discuss this and is absolutely adamant that he had never been given this med on 12/13 and he was told they would notify [name].    I discussed this issue with [name], he states he did want this med started while pt was being monitored at the hospital and he was never made aware of this incident prior to now.  Since pt has already been d/c'd home is currently at [facility] office getting Monitor placed he is ok with pt just getting medication from pharmacy and starting it tonight.  Pt is aware and agreeable, if he has any issues with Amio he will let us know

## 2015-11-16 ENCOUNTER — Telehealth (HOSPITAL_COMMUNITY): Payer: Self-pay | Admitting: Pharmacist

## 2015-11-16 NOTE — Telephone Encounter (Signed)
Losartan 75 mg PA approved by [employer] through 11/30/2016.   Ruta Hinds. Velva Harman, PharmD, BCPS, CPP Clinical Pharmacist Pager: [phone] Phone: [phone] 11/16/2015 4:10 PM

## 2015-11-19 ENCOUNTER — Other Ambulatory Visit (HOSPITAL_COMMUNITY): Payer: Self-pay | Admitting: Cardiology

## 2015-11-19 ENCOUNTER — Other Ambulatory Visit: Payer: Self-pay | Admitting: Family Medicine

## 2015-11-19 NOTE — Progress Notes (Signed)
Late entry post discharge: Gave the patient's Pacerone 200 mg dose to patient and he refused to take the dose. Stated he wanted to talk to someone in charge before he took his Pacerone. This RN requested the charge nurse go to room 14 and talk with the patient, that he wanted to talk with someone in charge. Per later report from the CN, the patient refused to take the medication and then discharged via w/c with his wife.

## 2015-12-05 ENCOUNTER — Ambulatory Visit: Payer: Medicare Other | Admitting: Internal Medicine

## 2015-12-06 ENCOUNTER — Ambulatory Visit: Payer: Medicare Other | Admitting: Pulmonary Disease

## 2015-12-10 ENCOUNTER — Inpatient Hospital Stay (HOSPITAL_COMMUNITY): Admit: 2015-12-10 | Payer: Medicare Other

## 2015-12-12 ENCOUNTER — Other Ambulatory Visit: Payer: Self-pay | Admitting: Family Medicine

## 2015-12-12 ENCOUNTER — Encounter: Payer: Self-pay | Admitting: *Deleted

## 2015-12-12 NOTE — Telephone Encounter (Signed)
Medication filled x1 with no refills.   Requires office visit before any further refills can be given.   Letter sent.  

## 2015-12-18 ENCOUNTER — Ambulatory Visit: Payer: Medicare Other | Admitting: Pulmonary Disease

## 2015-12-25 ENCOUNTER — Encounter: Payer: Self-pay | Admitting: Family Medicine

## 2015-12-25 ENCOUNTER — Ambulatory Visit (INDEPENDENT_AMBULATORY_CARE_PROVIDER_SITE_OTHER): Payer: Medicare Other | Admitting: Family Medicine

## 2015-12-25 ENCOUNTER — Other Ambulatory Visit: Payer: Self-pay | Admitting: Family Medicine

## 2015-12-25 VITALS — BP 128/82 | HR 80 | Temp 97.7°F | Resp 16

## 2015-12-25 DIAGNOSIS — E785 Hyperlipidemia, unspecified: Secondary | ICD-10-CM

## 2015-12-25 DIAGNOSIS — F411 Generalized anxiety disorder: Secondary | ICD-10-CM

## 2015-12-25 DIAGNOSIS — E0821 Diabetes mellitus due to underlying condition with diabetic nephropathy: Secondary | ICD-10-CM | POA: Diagnosis not present

## 2015-12-25 DIAGNOSIS — I5022 Chronic systolic (congestive) heart failure: Secondary | ICD-10-CM | POA: Diagnosis not present

## 2015-12-25 DIAGNOSIS — I10 Essential (primary) hypertension: Secondary | ICD-10-CM

## 2015-12-25 DIAGNOSIS — F332 Major depressive disorder, recurrent severe without psychotic features: Secondary | ICD-10-CM | POA: Diagnosis not present

## 2015-12-25 LAB — HEMOGLOBIN A1C, FINGERSTICK: Hgb A1C (fingerstick): 6.9 % — ABNORMAL HIGH (ref <5.7)

## 2015-12-25 MED ORDER — PANTOPRAZOLE SODIUM 40 MG PO TBEC: DELAYED_RELEASE_TABLET | ORAL | Status: DC

## 2015-12-25 MED ORDER — ESCITALOPRAM OXALATE 20 MG PO TABS: 20.0000 mg | ORAL_TABLET | Freq: Every day | ORAL | Status: DC

## 2015-12-25 MED ORDER — POTASSIUM CHLORIDE CRYS ER 20 MEQ PO TBCR: 20.0000 meq | EXTENDED_RELEASE_TABLET | Freq: Every day | ORAL | Status: DC

## 2015-12-25 MED ORDER — METOPROLOL SUCCINATE ER 100 MG PO TB24: 100.0000 mg | ORAL_TABLET | Freq: Every day | ORAL | Status: DC

## 2015-12-25 MED ORDER — SPIRONOLACTONE 25 MG PO TABS: 25.0000 mg | ORAL_TABLET | Freq: Every day | ORAL | Status: DC

## 2015-12-25 NOTE — Patient Instructions (Addendum)
Take your cholesterol medication- Fenofibrate  Referral to bariatric  Go back to [facility]- referral  Lexapro increased to 20mg   Give Release of records- for his Son- [name] F/U 6 weeks

## 2015-12-25 NOTE — Progress Notes (Signed)
Patient ID: [name], male   DOB: 04-28-1966, 50 y.o.   MRN: [id number]   Subjective:    Patient ID: [name], male    DOB: May 07, 1966, 50 y.o.   MRN: [id number]  Patient presents for F/U   she here for follow-up. He was admitted to the hospital in December secondary to recurrent palpitations concerning for ventricular tachycardia versus atrial fibrillation. He was admitted by his cardiologist. He also has chronic systolic congestive heart failure with an ejection fraction of 20-25% which she would benefit from defibrillator however he is reluctant to do this.  review of discharge summary also notes that he been abusing alcohol when he was having a palpitations as well.   he is still noncompliant with his medications. He is only taking losartan 50 mg he takes his to Ro some mild once a day. He never started the amiodarone prescribed after his last hospitalization he states that the medication label states that it can cause death. He was taken off the digoxin in order to start him on amiodarone. He is also not taking the fenofibrate which was prescribed for his significant hypertriglyceridemia    With regards to his mood disorder/major depression he is supposed to be on Lexapro and clonazepam as needed however he states he is always depressed, he is now in 2 relationships with women, he is not following with psychiatrist    Diabetes mellitus he has neuropathy that is not taking amitriptyline he is now using vitamin B complex.  His last A1c was 6.2% in April 2016 his cholesterol was recently done which showed significantly high triglycerides and unable to calculate his LDL.  His renal function is fairly well preserved.  his weight is up from previous he is now at 428 pounds per the hospital   Review Of Systems:  GEN- denies fatigue, fever, weight loss,weakness, recent illness HEENT- denies eye drainage, change in vision, nasal discharge, CVS- denies chest pain, palpitations RESP-  denies SOB, cough, wheeze ABD- denies N/V, change in stools, abd pain GU- denies dysuria, hematuria, dribbling, incontinence MSK- denies joint pain, muscle aches, injury Neuro- denies headache, dizziness, syncope, seizure activity       Objective:    BP 128/82 mmHg  Pulse 80  Temp(Src) 97.7 F (36.5 C) (Oral)  Resp 16 GEN- NAD, alert and oriented x3 HEENT- PERRL, EOMI, non injected sclera, pink conjunctiva, MMM, oropharynx clear,obese  Neck- Supple,  CVS- RRR, no murmur RESP-CTAB EXT- No edema Psych- normal affect and mood Pulses- Radial - 2+        Assessment & Plan:      Problem List Items Addressed This Visit    None      Note: This dictation was prepared with Dragon dictation along with smaller phrase technology. Any transcriptional errors that result from this process are unintentional.

## 2015-12-26 ENCOUNTER — Encounter: Payer: Self-pay | Admitting: Family Medicine

## 2015-12-26 NOTE — Assessment & Plan Note (Signed)
Reiterated need for his cholesterol medication he agrees to start the fenofibrate

## 2015-12-26 NOTE — Assessment & Plan Note (Signed)
Referring into bariatric surgery for consultation however I do think that he is high risk for surgery with his current cardiac status.

## 2015-12-26 NOTE — Assessment & Plan Note (Signed)
Controlled despite varying doses of medications

## 2015-12-26 NOTE — Assessment & Plan Note (Signed)
Increase lexapro to 20mg  Re-establish with [facility] center

## 2015-12-26 NOTE — Assessment & Plan Note (Signed)
He has a very poor ejection fraction and a home in underlying arrhythmia. Discussed the importance of him taking his medicines per the cardiologist's orders he'll have had this conversation with him multiple times. Had a frank conversation with him if he does not want to die early from some type of cardiovascular event then he needs to get on board with change in his lifestyle following through with recommendations from his specialist. He agrees to start the amiodarone one tablet a day.

## 2015-12-26 NOTE — Assessment & Plan Note (Signed)
A1C is up a little, but with his non compliance and reluctance to take meds before I will not start any today  If he will commit to his health and weight loss, things would improve significantly

## 2016-01-02 ENCOUNTER — Encounter: Payer: Self-pay | Admitting: Internal Medicine

## 2016-01-02 ENCOUNTER — Ambulatory Visit (INDEPENDENT_AMBULATORY_CARE_PROVIDER_SITE_OTHER): Payer: Medicare Other | Admitting: Internal Medicine

## 2016-01-02 VITALS — BP 126/82 | HR 80 | Ht 68.5 in | Wt >= 6400 oz

## 2016-01-02 DIAGNOSIS — I5022 Chronic systolic (congestive) heart failure: Secondary | ICD-10-CM | POA: Diagnosis not present

## 2016-01-02 DIAGNOSIS — I428 Other cardiomyopathies: Secondary | ICD-10-CM

## 2016-01-02 DIAGNOSIS — I429 Cardiomyopathy, unspecified: Secondary | ICD-10-CM | POA: Diagnosis not present

## 2016-01-02 MED ORDER — MAGNESIUM OXIDE 400 MG PO TABS: 400.0000 mg | ORAL_TABLET | Freq: Every day | ORAL | Status: DC

## 2016-01-02 NOTE — Patient Instructions (Addendum)
Medication Instructions: 1) Increase Magnesium oxide to 400 mg one tablet by mouth once daily  Labwork: - none  Procedures/Testing: - none  Follow-Up: - [name] will see you back on an as needed basis.  Any Additional Special Instructions Will Be Listed Below (If Applicable).     If you need a refill on your cardiac medications before your next appointment, please call your pharmacy.

## 2016-01-02 NOTE — Progress Notes (Signed)
.      Patient Care Team: Alycia Rossetti, MD as PCP - General (Family Medicine) Arnoldo Lenis, MD as Consulting Physician (Cardiology)   HPI  [name] is a 50 y.o. male Seen in followup for NICM and prior discussions of ICD; they have been reluctant to proceed.  On Guideline directed medical therapy   He was recently hospitalized with tachycardia. He was seen by Dr. Greggory Brandy. The feeling was that his weight was preclusive for an ICD. He had nonsustained VT in the put on amiodarone which he has not filled. They switched carvedilol--metoprolol.for rate control  He has not started taking amio  He has many questions about prog  He admits depression  Weight issues remain a big problem  Records and Results [facility] hospital records  Past Medical History  Diagnosis Date  . Chronic systolic heart failure ([place])   . Edema   . Atrial fibrillation ([place])   . Obesity, unspecified   . Anxiety state, unspecified   . Psychiatric disorder   . Obstructive sleep apnea   . History of medication noncompliance   . Alcohol abuse   . Shortness of breath   . Migraine   . Pulmonary embolism ([place])   . CHF (congestive heart failure) [facility])     Past Surgical History  Procedure Laterality Date  . Testicle surgery    . Cardiac catheterization    . Cardiac catheterization N/A 08/13/2015    Procedure: Right/Left Heart Cath and Coronary Angiography;  Surgeon: [name], MD;  Location: [facility];  Service: Cardiovascular;  Laterality: N/A;    Current Outpatient Prescriptions  Medication Sig Dispense Refill  . aspirin EC 81 MG tablet Take 1 tablet (81 mg total) by mouth daily. 90 tablet 3  . clonazePAM (KLONOPIN) 1 MG tablet TAKE 1 TABLET BY MOUTH 3 TIMES DAILY AS NEEDED 90 tablet 2  . escitalopram (LEXAPRO) 20 MG tablet Take 1 tablet (20 mg total) by mouth daily. 30 tablet 6  . fenofibrate 54 MG tablet Take 1 tablet (54 mg total) by mouth daily. 90 tablet 3  . losartan  (COZAAR) 25 MG tablet Take 3 tablets (75 mg total) by mouth at bedtime. 270 tablet 3  . Magnesium Oxide 200 MG TABS Take 1 tablet (200 mg total) by mouth daily. 30 tablet 3  . metoprolol succinate (TOPROL-XL) 100 MG 24 hr tablet Take 1 tablet (100 mg total) by mouth daily. 30 tablet 6  . NON FORMULARY Bipap, pressure 16/12 with 2L of O2    . pantoprazole (PROTONIX) 40 MG tablet TAKE 1 TABLET (40 MG TOTAL) BY MOUTH DAILY. 30 tablet 6  . potassium chloride SA (K-DUR,KLOR-CON) 20 MEQ tablet Take 1 tablet (20 mEq total) by mouth daily. 30 tablet 6  . spironolactone (ALDACTONE) 25 MG tablet Take 1 tablet (25 mg total) by mouth daily. 30 tablet 6  . thiamine (VITAMIN B-1) 100 MG tablet Take 100 mg by mouth daily.    Marland Kitchen torsemide (DEMADEX) 20 MG tablet Take 1 tablet (20 mg total) by mouth daily.    Marland Kitchen amiodarone (PACERONE) 200 MG tablet Take 1 tablet (200 mg total) twice daily for 1 week, then decrease to 1 tablet (200 mg) daily on 11/21/15 105 tablet 3  . [DISCONTINUED] pravastatin (PRAVACHOL) 40 MG tablet Take 40 mg by mouth daily.    . [DISCONTINUED] sertraline (ZOLOFT) 50 MG tablet Take 1 tablet (50 mg total) by mouth daily. Take  50mg  daily for  1 week then increase to 100mg  daily 60 tablet 2   No current facility-administered medications for this visit.    Allergies  Allergen Reactions  . Ace Inhibitors Anaphylaxis and Swelling  . Buspirone Other (See Comments)    dizziness      Review of Systems negative except from HPI and PMH  Physical Exam BP 126/82 mmHg  Pulse 80  Ht 5' 8.5" (1.74 m)  Wt 415 lb (188.243 kg)  BMI 62.18 kg/m2 Well developed and well nourished in no acute distress HENT normal E scleral and icterus clear Neck Supple JVP flat; carotids brisk and full Clear to ausculation  Regular rate and rhythm, no murmurs gallops or rub Soft with active bowel sounds No clubbing cyanosis  Edema Alert and oriented, grossly normal motor and sensory function Skin Warm and  Dry  Sinus rhythm at 98 18/09/36 Axis left -60  Event recorder was reviewed and demonstrated a number of runs of nonsustained ventricular tachycardia  Assessment and  Plan  Nonischemic cardiomyopathy  Nonsustained ventricular tachycardia  Congestive heart failure-chronic-systolic  Obstructive sleep apnea  Narcolepsy  Morbidly obese     Given the modest benefit associated with ICD implantation and nonischemic patient's. We have spent a long time today reviewing his medications and the importance of weight loss.  He is now on metoprolol. He'll continue his losartan and his Aldactone. I would wonder whether he wouldn't be a candidate for switch to [facility] and the addition of BiDil. I will defer these decisions to Dr. DM.  We have also discussed the role of a low-carb diet. I've given the [employer]. I've encouraged him to continue his exercise. He started working on this.  He has not followed up with his sleep apnea doctor following the disappearance of Dr. Gwenette Greet. We will talk to Dr. Radford Pax as to whether she might be able to follow him along with his CPAP.   More than 50% of 45 min was spent in counseling related to the above

## 2016-01-05 ENCOUNTER — Other Ambulatory Visit: Payer: Self-pay | Admitting: Family Medicine

## 2016-01-07 ENCOUNTER — Other Ambulatory Visit: Payer: Self-pay | Admitting: *Deleted

## 2016-01-07 ENCOUNTER — Ambulatory Visit (INDEPENDENT_AMBULATORY_CARE_PROVIDER_SITE_OTHER): Payer: Medicare Other | Admitting: Family Medicine

## 2016-01-07 ENCOUNTER — Encounter: Payer: Self-pay | Admitting: Family Medicine

## 2016-01-07 VITALS — BP 132/82 | HR 88 | Temp 98.6°F | Resp 16 | Ht 68.5 in

## 2016-01-07 DIAGNOSIS — M109 Gout, unspecified: Secondary | ICD-10-CM

## 2016-01-07 DIAGNOSIS — M10071 Idiopathic gout, right ankle and foot: Secondary | ICD-10-CM | POA: Diagnosis not present

## 2016-01-07 MED ORDER — OXYCODONE-ACETAMINOPHEN 5-325 MG PO TABS: 1.0000 | ORAL_TABLET | Freq: Three times a day (TID) | ORAL | Status: DC | PRN

## 2016-01-07 MED ORDER — PREDNISONE 20 MG PO TABS: ORAL_TABLET | ORAL | Status: DC

## 2016-01-07 NOTE — Telephone Encounter (Signed)
Medication called to pharmacy. 

## 2016-01-07 NOTE — Patient Instructions (Signed)
Take prednisone -start today with taper Take pain medication as prescribed F/U as previous  Gout Gout is when your joints become red, sore, and swell (inflamed). This is caused by the buildup of uric acid crystals in the joints. Uric acid is a chemical that is normally in the blood. If the level of uric acid gets too high in the blood, these crystals form in your joints and tissues. Over time, these crystals can form into masses near the joints and tissues. These masses can destroy bone and cause the bone to look misshapen (deformed). HOME CARE   Do not take aspirin for pain.  Only take medicine as told by your doctor.  Rest the joint as much as you can. When in bed, keep sheets and blankets off painful areas.  Keep the sore joints raised (elevated).  Put warm or cold packs on painful joints. Use of warm or cold packs depends on which works best for you.  Use crutches if the painful joint is in your leg.  Drink enough fluids to keep your pee (urine) clear or pale yellow. Limit alcohol, sugary drinks, and drinks with fructose in them.  Follow your diet instructions. Pay careful attention to how much protein you eat. Include fruits, vegetables, whole grains, and fat-free or low-fat milk products in your daily diet. Talk to your doctor or dietitian about the use of coffee, vitamin C, and cherries. These may help lower uric acid levels.  Keep a healthy body weight. GET HELP RIGHT AWAY IF:   You have watery poop (diarrhea), throw up (vomit), or have any side effects from medicines.  You do not feel better in 24 hours, or you are getting worse.  Your joint becomes suddenly more tender, and you have chills or a fever. MAKE SURE YOU:   Understand these instructions.  Will watch your condition.  Will get help right away if you are not doing well or get worse.   This information is not intended to replace advice given to you by your health care provider. Make sure you discuss any  questions you have with your health care provider.   Document Released: 08/26/2008 Document Revised: 12/08/2014 Document Reviewed: 06/30/2012 Elsevier Interactive Patient Education [employer].

## 2016-01-07 NOTE — Telephone Encounter (Signed)
okay

## 2016-01-07 NOTE — Progress Notes (Signed)
Patient ID: JENNIS BOTTE, male   DOB: 1966-06-14, 50 y.o.   MRN: [id number]    Subjective:    Patient ID: DRACEN FLUHARTY, male    DOB: [birth year], 50 y.o.   MRN: [id number]  Patient presents for R Great Toe Pain   Pt here with pain in right great toe, similar to previous gout flares, started 2 days ago. Has increased fish and other purines in diet, trying to eat healthy. Has tried OTC meds and Voltaren gel  no improvement, Soaks as well. No injury to toe    Review Of Systems:  GEN- denies fatigue, fever, weight loss,weakness, recent illness, CVS- denies chest pain, palpitations RESP- denies SOB, cough, wheeze MSK- +joint pain, muscle aches, injury       Objective:    BP 132/82 mmHg  Pulse 88  Temp(Src) 98.6 F (37 C) (Oral)  Resp 16  Ht 5' 8.5" (1.74 m) GEN- NAD, alert and oriented x3 EXT- No edema MSK- swelling of Right great toe, mild erythema , TTP at PIP, pain with ROM and to mild touch  Pulses- Radial, DP- 2+        Assessment & Plan:      Problem List Items Addressed This Visit    None    Visit Diagnoses    Acute gout of right foot, unspecified cause    -  Primary    With Heart failure, other comorboidites, treat with prednisone taper, Percocet for pain    Relevant Medications    predniSONE (DELTASONE) 20 MG tablet    oxyCODONE-acetaminophen (ROXICET) 5-325 MG tablet       Note: This dictation was prepared with Dragon dictation along with smaller phrase technology. Any transcriptional errors that result from this process are unintentional.

## 2016-01-07 NOTE — Telephone Encounter (Signed)
Ok to refill??  Last office visit 12/25/2015.  Last refill 08/27/2015, #2 refills.

## 2016-01-11 ENCOUNTER — Ambulatory Visit (HOSPITAL_COMMUNITY)
Admission: RE | Admit: 2016-01-11 | Discharge: 2016-01-11 | Disposition: A | Discharge status: Home / Self Care | Payer: Medicare Other | Source: Ambulatory Visit | Attending: Cardiology | Admitting: Cardiology

## 2016-01-11 ENCOUNTER — Telehealth (HOSPITAL_COMMUNITY): Payer: Self-pay | Admitting: Cardiology

## 2016-01-11 ENCOUNTER — Encounter (HOSPITAL_COMMUNITY): Payer: Self-pay

## 2016-01-11 VITALS — BP 126/76 | HR 62 | Wt >= 6400 oz

## 2016-01-11 DIAGNOSIS — I5022 Chronic systolic (congestive) heart failure: Secondary | ICD-10-CM | POA: Insufficient documentation

## 2016-01-11 DIAGNOSIS — R002 Palpitations: Secondary | ICD-10-CM

## 2016-01-11 DIAGNOSIS — F410 Panic disorder [episodic paroxysmal anxiety] without agoraphobia: Secondary | ICD-10-CM | POA: Diagnosis not present

## 2016-01-11 DIAGNOSIS — F17210 Nicotine dependence, cigarettes, uncomplicated: Secondary | ICD-10-CM | POA: Insufficient documentation

## 2016-01-11 DIAGNOSIS — G4733 Obstructive sleep apnea (adult) (pediatric): Secondary | ICD-10-CM | POA: Insufficient documentation

## 2016-01-11 DIAGNOSIS — I480 Paroxysmal atrial fibrillation: Secondary | ICD-10-CM | POA: Insufficient documentation

## 2016-01-11 DIAGNOSIS — Z79899 Other long term (current) drug therapy: Secondary | ICD-10-CM | POA: Insufficient documentation

## 2016-01-11 DIAGNOSIS — N529 Male erectile dysfunction, unspecified: Secondary | ICD-10-CM | POA: Diagnosis not present

## 2016-01-11 DIAGNOSIS — G629 Polyneuropathy, unspecified: Secondary | ICD-10-CM | POA: Diagnosis not present

## 2016-01-11 DIAGNOSIS — I428 Other cardiomyopathies: Secondary | ICD-10-CM | POA: Diagnosis not present

## 2016-01-11 DIAGNOSIS — Z7982 Long term (current) use of aspirin: Secondary | ICD-10-CM | POA: Diagnosis not present

## 2016-01-11 DIAGNOSIS — I110 Hypertensive heart disease with heart failure: Secondary | ICD-10-CM | POA: Diagnosis not present

## 2016-01-11 LAB — LIPID PANEL
Cholesterol: 186 mg/dL (ref 0–200)
HDL: 35 mg/dL — ABNORMAL LOW (ref >40)
LDL CALC: 107 mg/dL — AB (ref 0–99)
TRIGLYCERIDES: 222 mg/dL — AB (ref <150)
Total CHOL/HDL Ratio: 5.3 RATIO
VLDL: 44 mg/dL — AB (ref 0–40)

## 2016-01-11 LAB — BASIC METABOLIC PANEL
ANION GAP: 12 (ref 5–15)
BUN: 18 mg/dL (ref 6–20)
CHLORIDE: 104 mmol/L (ref 101–111)
CO2: 25 mmol/L (ref 22–32)
Calcium: 9.5 mg/dL (ref 8.9–10.3)
Creatinine, Ser: 1.14 mg/dL (ref 0.61–1.24)
Glucose, Bld: 104 mg/dL — ABNORMAL HIGH (ref 65–99)
POTASSIUM: 3.9 mmol/L (ref 3.5–5.1)
SODIUM: 141 mmol/L (ref 135–145)

## 2016-01-11 LAB — URIC ACID: URIC ACID, SERUM: 7.2 mg/dL (ref 4.4–7.6)

## 2016-01-11 LAB — BRAIN NATRIURETIC PEPTIDE: B Natriuretic Peptide: 44.8 pg/mL (ref 0.0–100.0)

## 2016-01-11 MED ORDER — LOSARTAN POTASSIUM 50 MG PO TABS: 50.0000 mg | ORAL_TABLET | Freq: Two times a day (BID) | ORAL | Status: DC

## 2016-01-11 MED ORDER — SILDENAFIL CITRATE 25 MG PO TABS: 25.0000 mg | ORAL_TABLET | Freq: Every day | ORAL | Status: DC | PRN

## 2016-01-11 MED ORDER — COLCHICINE 0.6 MG PO TABS: 0.6000 mg | ORAL_TABLET | Freq: Every day | ORAL | Status: DC | PRN

## 2016-01-11 NOTE — Patient Instructions (Signed)
INCREASE Losartan to 50mg  twice daily.  START Colchicine 0.6mg  tablet once daily as needed for gout pain.  START Viagra 25 mg tablet once daily as needed for erectile dysfunction.  Routine lab work today. Will notify you of abnormal results, otherwise no news is good news!  Return in 2 weeks for labs.  Follow up 2 months.  Do the following things EVERYDAY: 1) Weigh yourself in the morning before breakfast. Write it down and keep it in a log. 2) Take your medicines as prescribed 3) Eat low salt foods-Limit salt (sodium) to 2000 mg per day.  4) Stay as active as you can everyday 5) Limit all fluids for the day to less than 2 liters

## 2016-01-11 NOTE — Telephone Encounter (Signed)
Patient called to inquire about PAP for sidenifil Advised CHF pharmD not available will have her look into this and return call asap

## 2016-01-13 NOTE — Progress Notes (Signed)
Patient ID: [name], male   DOB: [birth year], 50 y.o.   MRN: [id number]   Advanced HF Clinic Note PCP: Dr. Buelah Manis HF Cardiology: [name]  50 yo with history of nonischemic cardiomyopathy presents for cardiology followup.  Echo in 5/15 showed EF 30-35%.  The cardiomyopathy may be due to prior ETOH abuse, he now has quit.  Past cath showed no significant coronary disease.  He is morbidly obese.  He was on Xarelto for a PE in 5/15 for about a year and is now off. Repeat echo in 9/16 showed EF 20-25%.   He was admitted in 12/16 with palpitations => he was found to have PVCs. Initially, it was recommended that he start on amiodarone but he did not want to take it and is having only rare PVCs now.   He is still smoking rare cigarettes but not drinking ETOH.  He has seen EP, have decided against ICD in setting of nonischemic CMP and morbid obesity.  He is short of breath walking up hills but no problems on flat ground.  No chest pain.  No orthopnea/PND.  He has a gout flare in his right 1st MTP joint. He is on prednisone but says that it isn't helping much.   ECG: NSR at 54, QRS 120 msec (iLBBB), nonspecific T wave flattening.   Labs (5/13): K 4.1, creatinine 1.05 Labs (1/14): K 3.8, creatinine 1.16, BNP 54 Labs (2/14): K 3.7, creatinine 1.11, BNP 28 Labs (2/16): K 4.1, creatinine 1.03, LDL 96, HCT 40 Labs (3/16): K 3.7, K 1.13, BNP 367 Labs (8/16): BNP 43, K 3.5, creatinine 1.01 Labs (08/13/15): K 3.7, creatinine 1.18, HCT 41.3 Labs (12/16): K 3.7, creatinine 1.14, TGs 495, digoxin < 0.2  PMH: 1. Nonischemic cardiomyopathy: Prior cath with no significant CAD.  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  Prior echoes with EF as low as 25%.  Echo (9/13) with EF 35-40%, moderate to severe LV dilation, diffuse hypokinesis, mild MR. Echo (5/15) with EF 30-35%, moderate to severe LAE, normal RV size and systolic function.  Angioedema with ACEI, headaches with  hydralazine/nitrates. Echo (3/16) with EF 25-30%, severe LV dilation, normal RV size and systolic function.  [facility] 08/13/15 showed no significant coronary disease; RA mean 6, PA 33/11 mean 23, PCWP mean 13, Fick CO/CI 4.75 /1.68 (difficult study, radial artery spasm, if needs future cath would use groin).  Echo (9/16) showed EF 20-25%.   2. HTN: angioedema with ACEI.  3. OSA: on Bipap 4. Morbid obesity 5. Paroxysmal atrial fibrillation: Not documented recently.   6. Smoker.  7. Anxiety/panic attacks 8. PE: 5/15, diagnosed by V/Q scan. CTA chest 8/16 negative for PE.  9. NSVT, PVCs: 30 day monitor (12/16) with PVCs, PACs, no atrial fibrillation.  10. Hematuria: Apparently had negative workup by urology.  11. ABIs (6/16) were normal 12. 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   SH: On disability, lives in [place], prior ETOH abuse but now dose not drink, no drugs, smokes 1 cig/day.  Has son and daughter.    FH: No premature CAD.    ROS: All systems reviewed and negative except as per HPI.   Current Outpatient Prescriptions  Medication Sig Dispense Refill  . aspirin EC 81 MG tablet Take 1 tablet (81 mg total) by mouth daily. 90 tablet 3  . clonazePAM (KLONOPIN) 1 MG tablet TAKE 1 TABLET BY MOUTH 3 TIMES DAILY AS NEEDED 90 tablet 2  . escitalopram (  LEXAPRO) 20 MG tablet Take 1 tablet (20 mg total) by mouth daily. 30 tablet 6  . fenofibrate 54 MG tablet Take 1 tablet (54 mg total) by mouth daily. 90 tablet 3  . losartan (COZAAR) 50 MG tablet Take 1 tablet (50 mg total) by mouth 2 (two) times daily. 60 tablet 6  . magnesium oxide (MAG-OX) 400 MG tablet Take 1 tablet (400 mg total) by mouth daily. 30 tablet 11  . metoprolol succinate (TOPROL-XL) 100 MG 24 hr tablet Take 1 tablet (100 mg total) by mouth daily. 30 tablet 6  . NON FORMULARY Bipap, pressure 16/12 with 2L of O2    . oxyCODONE-acetaminophen (ROXICET) 5-325 MG tablet Take 1 tablet by mouth every 8 (eight) hours as needed for severe  pain. 20 tablet 0  . pantoprazole (PROTONIX) 40 MG tablet TAKE 1 TABLET (40 MG TOTAL) BY MOUTH DAILY. 30 tablet 6  . potassium chloride SA (K-DUR,KLOR-CON) 20 MEQ tablet Take 1 tablet (20 mEq total) by mouth daily. 30 tablet 6  . predniSONE (DELTASONE) 20 MG tablet Take 40mg  x 2 days, then 20mg  x 2 days, then 10 days x 2 day 7 tablet 0  . spironolactone (ALDACTONE) 25 MG tablet Take 1 tablet (25 mg total) by mouth daily. 30 tablet 6  . thiamine (VITAMIN B-1) 100 MG tablet Take 100 mg by mouth daily.    Marland Kitchen torsemide (DEMADEX) 20 MG tablet Take 1 tablet (20 mg total) by mouth daily.    . colchicine 0.6 MG tablet Take 1 tablet (0.6 mg total) by mouth daily as needed. 30 tablet 1  . sildenafil (VIAGRA) 25 MG tablet Take 1 tablet (25 mg total) by mouth daily as needed for erectile dysfunction. 30 tablet 1  . [DISCONTINUED] pravastatin (PRAVACHOL) 40 MG tablet Take 40 mg by mouth daily.    . [DISCONTINUED] sertraline (ZOLOFT) 50 MG tablet Take 1 tablet (50 mg total) by mouth daily. Take  50mg  daily for 1 week then increase to 100mg  daily 60 tablet 2   No current facility-administered medications for this encounter.     BP 126/76 mmHg  Pulse 62  Wt 418 lb 1.9 oz (189.658 kg)  SpO2 97%   General: NAD, obese.  Neck: Thick, JVP 7 cm, no thyromegaly or thyroid nodule.  Lungs: Clear to auscultation bilaterally with normal respiratory effort. CV: Nondisplaced PMI.  Heart regular S1/S2, no S3/S4, no murmur.  Trace ankle edema.  No carotid bruit.  Normal pedal pulses.  Abdomen: Soft, nontender, unable to palpate liver edge, mild distention.  Neurologic: Alert and oriented x 3.  Psych: Normal affect. Extremities: No clubbing or cyanosis. R 1st MTP warm/tender.   Assessment/Plan: 1. Chronic systolic CHF: Nonischemic cardiomyopathy. Echo (9/16) with EF 20-25%. RHC in 9/16 showed normal filling pressures but decreased Fick cardiac output.  I am not sure the cardiac output was accurate. Volume status looks  ok on exam.  NYHA class II symptoms.  He was seen by EP and decided against ICD given marked obesity.  QRS not wide enough for CRT.   - Continue current Toprol XL and spironolactone.  - Increase losartan to 50 mg bid.  He had angioedema with ACEI, but the risk of angioedema in this situation with ARB use is low.  I think risk of recurrent angioedema with Delene Loll would be higher, so will not switch ARB to entresto.  - OK to continue taking Torsemide 20 daily as he has been doing.  BMET today and again in  2 wks with increase in losartan. - Continue to avoid ETOH.  - Had side effects from digoxin, does not want to take. - Headaches with Bidil, cannot take.  2. Obesity: Needs to continue to work on diet/exercise for weight loss.  3. Smoking:  Needs to quit smoking altogether.   4. Anxiety/Panic 5. Paroxysmal atrial fibrillation: Remote. He is no longer anticoagulated. Event monitor in 12/16 showed no atrial fibrillation.  6. PE: 5/15, diagnosed by V/Q scan.  He was on Xarelto for about a year but stopped in the setting of hematuria.  CTA chest in 8/16 did not show a PE.  7. Erectile dysfunction: May try low dose Viagra.  He is not on nitrates.   [name] 01/13/2016

## 2016-01-21 ENCOUNTER — Encounter (HOSPITAL_COMMUNITY): Payer: Self-pay | Admitting: *Deleted

## 2016-01-25 ENCOUNTER — Ambulatory Visit (HOSPITAL_COMMUNITY)
Admission: RE | Admit: 2016-01-25 | Discharge: 2016-01-25 | Disposition: A | Discharge status: Home / Self Care | Payer: Medicare Other | Source: Ambulatory Visit | Attending: Internal Medicine | Admitting: Internal Medicine

## 2016-01-25 DIAGNOSIS — R9431 Abnormal electrocardiogram [ECG] [EKG]: Secondary | ICD-10-CM | POA: Diagnosis not present

## 2016-01-25 DIAGNOSIS — I5022 Chronic systolic (congestive) heart failure: Secondary | ICD-10-CM | POA: Diagnosis not present

## 2016-01-25 LAB — BASIC METABOLIC PANEL
Anion gap: 10 (ref 5–15)
BUN: 13 mg/dL (ref 6–20)
CALCIUM: 8.9 mg/dL (ref 8.9–10.3)
CO2: 22 mmol/L (ref 22–32)
Chloride: 108 mmol/L (ref 101–111)
Creatinine, Ser: 0.99 mg/dL (ref 0.61–1.24)
GFR calc Af Amer: >60 mL/min (ref >60)
GLUCOSE: 163 mg/dL — AB (ref 65–99)
Potassium: 3.8 mmol/L (ref 3.5–5.1)
Sodium: 140 mmol/L (ref 135–145)

## 2016-01-25 NOTE — Addendum Note (Signed)
Encounter addended by: Harvie Junior, CMA on: 01/25/2016 10:20 AM<BR>     Documentation filed: [employer], Orders

## 2016-02-08 ENCOUNTER — Ambulatory Visit: Payer: Medicare Other | Admitting: Family Medicine

## 2016-02-11 ENCOUNTER — Encounter: Payer: Self-pay | Admitting: Family Medicine

## 2016-02-12 ENCOUNTER — Telehealth (HOSPITAL_COMMUNITY): Payer: Self-pay

## 2016-02-12 ENCOUNTER — Ambulatory Visit: Payer: Medicare Other | Admitting: Family Medicine

## 2016-02-12 ENCOUNTER — Emergency Department (HOSPITAL_COMMUNITY): Payer: Medicare Other

## 2016-02-12 ENCOUNTER — Encounter (HOSPITAL_COMMUNITY): Payer: Self-pay

## 2016-02-12 ENCOUNTER — Emergency Department (HOSPITAL_COMMUNITY)
Admission: EM | Admit: 2016-02-12 | Discharge: 2016-02-12 | Disposition: A | Discharge status: Home / Self Care | Payer: Medicare Other | Attending: Emergency Medicine | Admitting: Emergency Medicine

## 2016-02-12 DIAGNOSIS — M19071 Primary osteoarthritis, right ankle and foot: Secondary | ICD-10-CM | POA: Insufficient documentation

## 2016-02-12 DIAGNOSIS — B353 Tinea pedis: Secondary | ICD-10-CM | POA: Diagnosis not present

## 2016-02-12 DIAGNOSIS — Z7982 Long term (current) use of aspirin: Secondary | ICD-10-CM | POA: Insufficient documentation

## 2016-02-12 DIAGNOSIS — M79674 Pain in right toe(s): Secondary | ICD-10-CM | POA: Diagnosis not present

## 2016-02-12 DIAGNOSIS — Z9114 Patient's other noncompliance with medication regimen: Secondary | ICD-10-CM | POA: Diagnosis not present

## 2016-02-12 DIAGNOSIS — G43909 Migraine, unspecified, not intractable, without status migrainosus: Secondary | ICD-10-CM | POA: Insufficient documentation

## 2016-02-12 DIAGNOSIS — I4891 Unspecified atrial fibrillation: Secondary | ICD-10-CM | POA: Diagnosis not present

## 2016-02-12 DIAGNOSIS — Z9889 Other specified postprocedural states: Secondary | ICD-10-CM | POA: Diagnosis not present

## 2016-02-12 DIAGNOSIS — Z86711 Personal history of pulmonary embolism: Secondary | ICD-10-CM | POA: Insufficient documentation

## 2016-02-12 DIAGNOSIS — M19079 Primary osteoarthritis, unspecified ankle and foot: Secondary | ICD-10-CM

## 2016-02-12 DIAGNOSIS — I5022 Chronic systolic (congestive) heart failure: Secondary | ICD-10-CM | POA: Diagnosis not present

## 2016-02-12 DIAGNOSIS — Z79899 Other long term (current) drug therapy: Secondary | ICD-10-CM | POA: Diagnosis not present

## 2016-02-12 DIAGNOSIS — M79671 Pain in right foot: Secondary | ICD-10-CM | POA: Diagnosis present

## 2016-02-12 DIAGNOSIS — Z8669 Personal history of other diseases of the nervous system and sense organs: Secondary | ICD-10-CM | POA: Diagnosis not present

## 2016-02-12 DIAGNOSIS — F419 Anxiety disorder, unspecified: Secondary | ICD-10-CM | POA: Insufficient documentation

## 2016-02-12 DIAGNOSIS — F17210 Nicotine dependence, cigarettes, uncomplicated: Secondary | ICD-10-CM | POA: Insufficient documentation

## 2016-02-12 NOTE — Discharge Instructions (Signed)

## 2016-02-12 NOTE — ED Notes (Signed)
Patient here with ongoing right toe pain since walking on treadmill, has been treated for gout but no better. Is uric acid was reported normal

## 2016-02-12 NOTE — ED Provider Notes (Signed)
CSN: [id number]     Arrival date & time 02/12/16  1123 History   First MD Initiated Contact with Patient 02/12/16 1243     Chief Complaint  Patient presents with  . toe pain    HPI Comments: 50 year old male presents with R great toe pain for the past month and a half. He was walking on a treadmill at the onset. He states he went to his PCP who prescribed him a 7 day course of steroids for presumed gout flare with no relief. He then saw his cardiologist who prescribed him Colchicine which he is currently taking and has been on for the past month. The pain is sharp and stabbing, worse with movement. Associated skin and nail changes. Denies numbness and tingling. The history is provided by the patient.    Past Medical History  Diagnosis Date  . Chronic systolic heart failure ([place])   . Edema   . Atrial fibrillation ([place])   . Obesity, unspecified   . Anxiety state, unspecified   . Psychiatric disorder   . Obstructive sleep apnea   . History of medication noncompliance   . Alcohol abuse   . Shortness of breath   . Migraine   . Pulmonary embolism ([place])   . CHF (congestive heart failure) [facility])    Past Surgical History  Procedure Laterality Date  . Testicle surgery    . Cardiac catheterization    . Cardiac catheterization N/A 08/13/2015    Procedure: Right/Left Heart Cath and Coronary Angiography;  Surgeon: [name], MD;  Location: [facility];  Service: Cardiovascular;  Laterality: N/A;   Family History  Problem Relation Age of Onset  . Hypertension      Family History  . Stroke      Family History  . Diabetes      Family History  . Cancer Mother     brain tumor  . Hypertension Mother   . Diabetes Father     Deceased, 92  . Heart disease Maternal Grandmother   . Diabetes Daughter    Social History  Substance Use Topics  . Smoking status: Current Every Day Smoker -- 0.50 packs/day for 30 years    Types: Cigarettes  . Smokeless tobacco: Never Used   Comment: 10/2014- using patches  . Alcohol Use: 0.0 oz/week    0 Standard drinks or equivalent per week     Comment: nothing to drink since last OV with Dr. Harl Bowie per patient    Review of Systems  Constitutional: Negative.   Musculoskeletal: Positive for joint swelling, arthralgias and gait problem.  Skin: Positive for color change.    Allergies  Ace inhibitors and Buspirone  Home Medications   Prior to Admission medications   Medication Sig Start Date End Date Taking? Authorizing Provider  aspirin EC 81 MG tablet Take 1 tablet (81 mg total) by mouth daily. 08/01/15   [name], MD  clonazePAM (KLONOPIN) 1 MG tablet TAKE 1 TABLET BY MOUTH 3 TIMES DAILY AS NEEDED 01/07/16   Alycia Rossetti, MD  colchicine 0.6 MG tablet Take 1 tablet (0.6 mg total) by mouth daily as needed. 01/11/16   [name], MD  escitalopram (LEXAPRO) 20 MG tablet Take 1 tablet (20 mg total) by mouth daily. 12/25/15   Alycia Rossetti, MD  fenofibrate 54 MG tablet Take 1 tablet (54 mg total) by mouth daily. 11/14/15   Shirley Friar, PA-C  losartan (COZAAR) 50 MG tablet Take  1 tablet (50 mg total) by mouth 2 (two) times daily. 01/11/16   [name], MD  magnesium oxide (MAG-OX) 400 MG tablet Take 1 tablet (400 mg total) by mouth daily. 01/02/16   Deboraha Sprang, MD  metoprolol succinate (TOPROL-XL) 100 MG 24 hr tablet Take 1 tablet (100 mg total) by mouth daily. 12/25/15   Alycia Rossetti, MD  NON FORMULARY Bipap, pressure 16/12 with 2L of O2    Historical Provider, MD  oxyCODONE-acetaminophen (ROXICET) 5-325 MG tablet Take 1 tablet by mouth every 8 (eight) hours as needed for severe pain. 01/07/16   Alycia Rossetti, MD  pantoprazole (PROTONIX) 40 MG tablet TAKE 1 TABLET (40 MG TOTAL) BY MOUTH DAILY. 12/25/15   Alycia Rossetti, MD  potassium chloride SA (K-DUR,KLOR-CON) 20 MEQ tablet Take 1 tablet (20 mEq total) by mouth daily. 12/25/15   Alycia Rossetti, MD  predniSONE (DELTASONE) 20 MG tablet Take  40mg  x 2 days, then 20mg  x 2 days, then 10 days x 2 day 01/07/16   Alycia Rossetti, MD  sildenafil (VIAGRA) 25 MG tablet Take 1 tablet (25 mg total) by mouth daily as needed for erectile dysfunction. 01/11/16   [name], MD  spironolactone (ALDACTONE) 25 MG tablet Take 1 tablet (25 mg total) by mouth daily. 12/25/15   Alycia Rossetti, MD  thiamine (VITAMIN B-1) 100 MG tablet Take 100 mg by mouth daily.    Historical Provider, MD  torsemide (DEMADEX) 20 MG tablet Take 1 tablet (20 mg total) by mouth daily. 08/21/15   [name], MD   BP 117/71 mmHg  Pulse 82  Temp(Src) 98.9 F (37.2 C) (Oral)  Resp 18  SpO2 98% Physical Exam  Constitutional: He is oriented to person, place, and time. No distress.  Morbidly obese  HENT:  Head: Normocephalic and atraumatic.  Eyes: Pupils are equal, round, and reactive to light.  Neurological: He is alert and oriented to person, place, and time.  Skin: Skin is warm and dry.  Circumferential tenderness to palpation of the R first metatarsal joint Decreased ROM Fungal infection of all toes of the R foot.   Vitals reviewed.   ED Course  Procedures (including critical care time) Labs Review Labs Reviewed - No data to display  Imaging Review Dg Toe Great Right  02/12/2016  CLINICAL DATA:  Great toe pain for 1 month. EXAM: RIGHT GREAT TOE COMPARISON:  None. FINDINGS: There are moderate degenerative changes involving the first metatarsal phalangeal joint and interphalangeal joint with joint space narrowing and osteophytic spurring. No obvious erosive changes or soft tissue calcifications. No acute fracture. IMPRESSION: Degenerative changes but no obvious plain film findings for gout. No acute fracture. Electronically Signed   By: Marijo Sanes M.D.   On: 02/12/2016 11:57   I have personally reviewed and evaluated these images and lab results as part of my medical decision-making.   MDM   Final diagnoses:  Arthritis of big toe   Discussed with  patient that his symptoms are most likely coming from arthritis due to findings on xray, lack of redness, swelling, and minimal relief from steroids and colchicine. Advised pt to stop colchicine and to follow up with his PCP for management. Patient informed of clinical course, understand medical decision-making process, and agree with plan.   Recardo Evangelist, PA-C 02/12/16 1405  Sherwood Gambler, MD 02/16/16 [phone]

## 2016-02-12 NOTE — Telephone Encounter (Signed)
Patient called complaining of continued toe pain.  States this has gone on for a month, and Dr. Aundra Dubin prescribed prednisone taper after a recent CHF OV for possible gout flare.  Patient states he does not think this is gout.  Foot is not swollen, and only single toe has sharp pain with bruising.  Advised this may be a toe fracture and to contact PCP for xray/eval.  Renee Pain

## 2016-02-25 ENCOUNTER — Encounter: Payer: Self-pay | Admitting: Family Medicine

## 2016-02-25 ENCOUNTER — Ambulatory Visit (INDEPENDENT_AMBULATORY_CARE_PROVIDER_SITE_OTHER): Payer: Medicare Other | Admitting: Family Medicine

## 2016-02-25 ENCOUNTER — Other Ambulatory Visit: Payer: Self-pay | Admitting: Family Medicine

## 2016-02-25 VITALS — BP 128/78 | HR 80 | Temp 98.9°F | Resp 16

## 2016-02-25 DIAGNOSIS — M19079 Primary osteoarthritis, unspecified ankle and foot: Secondary | ICD-10-CM | POA: Insufficient documentation

## 2016-02-25 DIAGNOSIS — I5022 Chronic systolic (congestive) heart failure: Secondary | ICD-10-CM

## 2016-02-25 DIAGNOSIS — M19071 Primary osteoarthritis, right ankle and foot: Secondary | ICD-10-CM

## 2016-02-25 DIAGNOSIS — M10071 Idiopathic gout, right ankle and foot: Secondary | ICD-10-CM | POA: Diagnosis not present

## 2016-02-25 DIAGNOSIS — R300 Dysuria: Secondary | ICD-10-CM | POA: Diagnosis not present

## 2016-02-25 LAB — URINALYSIS, MICROSCOPIC ONLY
CRYSTALS: NONE SEEN [HPF]
Casts: NONE SEEN [LPF]
YEAST: NONE SEEN [HPF]

## 2016-02-25 LAB — URINALYSIS, ROUTINE W REFLEX MICROSCOPIC
BILIRUBIN URINE: NEGATIVE
Glucose, UA: NEGATIVE
Hgb urine dipstick: NEGATIVE
Ketones, ur: NEGATIVE
Nitrite: NEGATIVE
PH: 6 (ref 5.0–8.0)
Protein, ur: NEGATIVE
Specific Gravity, Urine: 1.02 (ref 1.001–1.035)

## 2016-02-25 MED ORDER — OXYCODONE-ACETAMINOPHEN 5-325 MG PO TABS: 1.0000 | ORAL_TABLET | Freq: Three times a day (TID) | ORAL | Status: DC | PRN

## 2016-02-25 NOTE — Progress Notes (Signed)
Patient ID: [name], male   DOB: [birth year], 50 y.o.   MRN: [id number]    Subjective:    Patient ID: [name], male    DOB: 1966-05-15, 50 y.o.   MRN: [id number]  Patient presents for Toe Pain  Patient here with recurrent right great toe pain. I diagnosed him with gout in the past his uric acid was a little elevated at 7.2 upper normal limits. He went to the emergency room a few days ago x-ray was done which showed arthritis of the toe. He cannot take any anti-inflammatories because of his CHF and his medications. I did treat him back in February with prednisone he was then switched the culture seen by his cardiologist but none of these have completely taken away the pain. He has been up on his feet for the past 3 days with his job. Note he also has not been taking his Lasix as prescribed.  On review of medications he is only taking losartan once a day. There is also question about his potassium. He's had hypokalemia despite his spironolactone and his losartan in the past. He takes his along with his diuretic. His possible that his last hospitalization with arrhythmia was due to hypokalemia. His cardiologist actually recommended that his losartan be increased to 50 mg twice a day but he has not started this yet.  At the end of the visit he he stated he had some burning with urination. He also admits to being sexually active with more than one partner. Denies penile discharge   Review Of Systems:  GEN- denies fatigue, fever, weight loss,weakness, recent illness HEENT- denies eye drainage, change in vision, nasal discharge, CVS- denies chest pain, palpitations RESP- denies SOB, cough, wheeze ABD- denies N/V, change in stools, abd pain GU- denies dysuria, hematuria, dribbling, incontinence MSK+ joint pain, muscle aches, injury Neuro- denies headache, dizziness, syncope, seizure activity       Objective:    BP 128/78 mmHg  Pulse 80  Temp(Src) 98.9 F (37.2 C) (Oral)  Resp  16 GEN- NAD, alert and oriented x3 HEENT- PERRL, EOMI, non injected sclera, pink conjunctiva, MMM, oropharynx clear CVS- RRR, no murmur RESP-CTAB Ext- 1 + edema  MSK- swelling of Right great toe peeling skin on feet , TTP at PIP Pulses- Radial, DP- 2+        Assessment & Plan:    With regards to the hypokalemia and his medications. He is on potassium sparing drugs and has had hypokalemia despite this however true compliance of medications has been out ongoing issue. I'm making the assumption that when his cardiologist was increase in the losartan that this was also to help with his potassium along with this aldactone.  I will advise him to decrease his potassium to one half tablet 83meq the lens along with his diuretic. He is also to take his diuretic as prescribed. He then was to wait a few days before starting the twice a day losartan. Once he started twice a day was started nothing he could stop the potassium and his levels can be rechecked at his cardiology visit on the 18th   Problem List Items Addressed This Visit    Osteoarthritis of toe joint   Relevant Medications   oxyCODONE-acetaminophen (ROXICET) 5-325 MG tablet    Other Visit Diagnoses    Dysuria    -  Primary    Check UA and urine GC, he is sexally active multiple partners    Relevant Orders  GC/chlamydia probe amp, urine    Urinalysis, Routine w reflex microscopic (not at [facility]) (Completed)    Urine culture    Acute idiopathic gout of right foot        Based on his history of think this is gout he does have some arthritis that is showing up ON xray but this flares on and off. Due to cardiac history no NSAIDS, He is also diabetic so I like to avoid prednisone unless truly necessary. I will send him to podiatry so they can evaluate him and help was maybe distinct which between this and the arthritis and the best treatment plan.     Relevant Medications    oxyCODONE-acetaminophen (ROXICET) 5-325 MG tablet       Note:  This dictation was prepared with Dragon dictation along with smaller phrase technology. Any transcriptional errors that result from this process are unintentional.

## 2016-02-25 NOTE — Patient Instructions (Signed)
Referral to foot doctor Take pain medication  Take your water pill Take 1/2 tablet potassium with water pill  F/U 4 months

## 2016-02-26 LAB — GC/CHLAMYDIA PROBE AMP
CT PROBE, AMP APTIMA: NOT DETECTED
GC PROBE AMP APTIMA: NOT DETECTED

## 2016-02-27 LAB — URINE CULTURE: Colony Count: 50000

## 2016-03-18 ENCOUNTER — Encounter: Payer: Self-pay | Admitting: Sports Medicine

## 2016-03-18 ENCOUNTER — Ambulatory Visit (HOSPITAL_COMMUNITY)
Admission: RE | Admit: 2016-03-18 | Discharge: 2016-03-18 | Disposition: A | Discharge status: Home / Self Care | Payer: Medicare Other | Source: Ambulatory Visit | Attending: Adult Health | Admitting: Adult Health

## 2016-03-18 ENCOUNTER — Ambulatory Visit (INDEPENDENT_AMBULATORY_CARE_PROVIDER_SITE_OTHER): Payer: Medicare Other | Admitting: Sports Medicine

## 2016-03-18 VITALS — Ht 68.5 in | Wt >= 6400 oz

## 2016-03-18 VITALS — BP 114/76 | HR 73 | Wt >= 6400 oz

## 2016-03-18 DIAGNOSIS — I110 Hypertensive heart disease with heart failure: Secondary | ICD-10-CM | POA: Diagnosis not present

## 2016-03-18 DIAGNOSIS — Z79899 Other long term (current) drug therapy: Secondary | ICD-10-CM | POA: Insufficient documentation

## 2016-03-18 DIAGNOSIS — Z720 Tobacco use: Secondary | ICD-10-CM

## 2016-03-18 DIAGNOSIS — G629 Polyneuropathy, unspecified: Secondary | ICD-10-CM | POA: Diagnosis not present

## 2016-03-18 DIAGNOSIS — F419 Anxiety disorder, unspecified: Secondary | ICD-10-CM | POA: Insufficient documentation

## 2016-03-18 DIAGNOSIS — B353 Tinea pedis: Secondary | ICD-10-CM | POA: Diagnosis not present

## 2016-03-18 DIAGNOSIS — M2141 Flat foot [pes planus] (acquired), right foot: Secondary | ICD-10-CM | POA: Diagnosis not present

## 2016-03-18 DIAGNOSIS — Z86718 Personal history of other venous thrombosis and embolism: Secondary | ICD-10-CM | POA: Diagnosis not present

## 2016-03-18 DIAGNOSIS — Z7982 Long term (current) use of aspirin: Secondary | ICD-10-CM | POA: Diagnosis not present

## 2016-03-18 DIAGNOSIS — G4733 Obstructive sleep apnea (adult) (pediatric): Secondary | ICD-10-CM | POA: Insufficient documentation

## 2016-03-18 DIAGNOSIS — I480 Paroxysmal atrial fibrillation: Secondary | ICD-10-CM | POA: Diagnosis not present

## 2016-03-18 DIAGNOSIS — M2142 Flat foot [pes planus] (acquired), left foot: Secondary | ICD-10-CM | POA: Diagnosis not present

## 2016-03-18 DIAGNOSIS — F17210 Nicotine dependence, cigarettes, uncomplicated: Secondary | ICD-10-CM | POA: Insufficient documentation

## 2016-03-18 DIAGNOSIS — I428 Other cardiomyopathies: Secondary | ICD-10-CM | POA: Diagnosis not present

## 2016-03-18 DIAGNOSIS — I5022 Chronic systolic (congestive) heart failure: Secondary | ICD-10-CM | POA: Insufficient documentation

## 2016-03-18 DIAGNOSIS — B351 Tinea unguium: Secondary | ICD-10-CM

## 2016-03-18 DIAGNOSIS — M79673 Pain in unspecified foot: Secondary | ICD-10-CM | POA: Diagnosis not present

## 2016-03-18 LAB — BASIC METABOLIC PANEL
Anion gap: 11 (ref 5–15)
BUN: 21 mg/dL — ABNORMAL HIGH (ref 6–20)
CHLORIDE: 107 mmol/L (ref 101–111)
CO2: 22 mmol/L (ref 22–32)
CREATININE: 1.19 mg/dL (ref 0.61–1.24)
Calcium: 9.2 mg/dL (ref 8.9–10.3)
Glucose, Bld: 94 mg/dL (ref 65–99)
POTASSIUM: 3.8 mmol/L (ref 3.5–5.1)
SODIUM: 140 mmol/L (ref 135–145)

## 2016-03-18 LAB — BRAIN NATRIURETIC PEPTIDE: B NATRIURETIC PEPTIDE 5: 27.4 pg/mL (ref 0.0–100.0)

## 2016-03-18 MED ORDER — POTASSIUM CHLORIDE CRYS ER 20 MEQ PO TBCR: 40.0000 meq | EXTENDED_RELEASE_TABLET | Freq: Every day | ORAL | Status: DC

## 2016-03-18 MED ORDER — TORSEMIDE 20 MG PO TABS: 40.0000 mg | ORAL_TABLET | Freq: Every day | ORAL | Status: DC

## 2016-03-18 MED ORDER — CLOTRIMAZOLE 1 % EX CREA: 1.0000 "application " | TOPICAL_CREAM | Freq: Two times a day (BID) | CUTANEOUS | Status: DC

## 2016-03-18 MED ORDER — LOSARTAN POTASSIUM 50 MG PO TABS: ORAL_TABLET | ORAL | Status: DC

## 2016-03-18 NOTE — Progress Notes (Signed)
Patient ID: [name], male   DOB: 1966-10-14, 50 y.o.   MRN: [id number] Subjective: [name] is a 49 y.o. male patient seen today in office with complaint of painful thickened and discolored nails. Patient is desiring treatment for nail changes; has tried OTC topicals in the past with no improvement. Reports that nails are becoming difficult to manage because of the thickness. Patient also is concerned about the dry skin at both feet and heels; patient has no other pedal complaints at this time.   Patient Active Problem List   Diagnosis Date Noted  . Osteoarthritis of toe joint 02/25/2016  . Palpitations   . Paroxysmal atrial fibrillation (HCC)   . Morbid obesity due to excess calories ([place])   . Palpitation 11/12/2015  . Chronic anticoagulation, secondary to PE 03/2014 12/27/2014  . Osteoarthritis of both knees 12/27/2014  . Chronic systolic CHF (congestive heart failure) ([place])   . Acute pulmonary embolism ([place]) 05/01/2014  . Hyperlipidemia 02/07/2014  . Non compliance w medication regimen 02/07/2014  . NSVT (nonsustained ventricular tachycardia) ([place]) 09/26/2013  . Alcohol abuse 08/10/2013  . Gout attack 06/29/2013  . Nonischemic cardiomyopathy ([place]) 01/26/2013  . Pain in joint, shoulder region 01/18/2013  . Diabetes mellitus ([place]) 04/13/2012  . Paresthesia and pain of extremity 04/13/2012  . Back pain 04/13/2012  . SOB (shortness of breath) 02/18/2012  . Tobacco user 02/18/2012  . Bilateral leg edema 12/21/2011  . Migraine 08/10/2011  . ED (erectile dysfunction) 07/11/2011  . Insomnia 07/11/2011  . Major depression ([place]) 05/08/2011  . ATRIAL FIBRILLATION, PAROXYSMAL 09/18/2010  . Anxiety state 06/12/2010  . Morbid obesity ([place]) 06/11/2010  . Essential hypertension 06/11/2010  . Systolic CHF, chronic ([place]) 06/11/2010  . ESOPHAGEAL REFLUX 06/11/2010  . OSA (obstructive sleep apnea) with BiPap and oxygen 06/11/2010    Current Outpatient Prescriptions on File Prior to  Visit  Medication Sig Dispense Refill  . aspirin EC 81 MG tablet Take 1 tablet (81 mg total) by mouth daily. 90 tablet 3  . clonazePAM (KLONOPIN) 1 MG tablet Take 1 mg by mouth 3 (three) times daily as needed for anxiety.    . fenofibrate 54 MG tablet Take 1 tablet (54 mg total) by mouth daily. 90 tablet 3  . losartan (COZAAR) 50 MG tablet Take 1 tab in AM and 1/2 tab in PM 45 tablet 3  . magnesium oxide (MAG-OX) 400 MG tablet Take 1 tablet (400 mg total) by mouth daily. 30 tablet 11  . metoprolol succinate (TOPROL-XL) 100 MG 24 hr tablet Take 1 tablet (100 mg total) by mouth daily. 30 tablet 6  . NON FORMULARY Bipap, pressure 16/12 with 2L of O2    . oxyCODONE-acetaminophen (ROXICET) 5-325 MG tablet Take 1 tablet by mouth every 8 (eight) hours as needed for severe pain. 20 tablet 0  . pantoprazole (PROTONIX) 40 MG tablet TAKE 1 TABLET (40 MG TOTAL) BY MOUTH DAILY. 30 tablet 6  . potassium chloride SA (K-DUR,KLOR-CON) 20 MEQ tablet Take 2 tablets (40 mEq total) by mouth daily. 60 tablet 6  . sildenafil (VIAGRA) 25 MG tablet Take 1 tablet (25 mg total) by mouth daily as needed for erectile dysfunction. (Patient not taking: Reported on 03/18/2016) 30 tablet 1  . spironolactone (ALDACTONE) 25 MG tablet Take 1 tablet (25 mg total) by mouth daily. 30 tablet 6  . thiamine (VITAMIN B-1) 100 MG tablet Take 100 mg by mouth daily.    Marland Kitchen torsemide (DEMADEX) 20 MG tablet Take 2  tablets (40 mg total) by mouth daily. 60 tablet 3  . [DISCONTINUED] pravastatin (PRAVACHOL) 40 MG tablet Take 40 mg by mouth daily.    . [DISCONTINUED] sertraline (ZOLOFT) 50 MG tablet Take 1 tablet (50 mg total) by mouth daily. Take  50mg  daily for 1 week then increase to 100mg  daily 60 tablet 2   No current facility-administered medications on file prior to visit.    Allergies  Allergen Reactions  . Ace Inhibitors Anaphylaxis and Swelling  . Buspirone Other (See Comments)    dizziness    Objective: Physical Exam  General:  Well developed, nourished, no acute distress, awake, alert and oriented x 3  Vascular: Dorsalis pedis artery 2/4 bilateral, Posterior tibial artery 1/4 bilateral, skin temperature warm to warm proximal to distal bilateral lower extremities, no varicosities, pedal hair present bilateral.  Neurological: Gross sensation present via light touch bilateral.   Dermatological: Skin is warm, dry, and supple bilateral, Nails 1-10 are tender, short thick, and discolored with moderate subungal debris, no webspace macerations present bilateral, no open lesions present bilateral, no callus/corns/hyperkeratotic tissue present bilateral. + scaly skin in annular fashion and dry skin at heels with no acute signs of infection bilateral.  Musculoskeletal:Pes planus foot type noted bilateral. Muscular strength within normal limits without painon range of motion. No pain with calf compression bilateral.  Assessment and Plan:  Problem List Items Addressed This Visit    None    Visit Diagnoses    Dermatophytosis of nail    -  Primary    Relevant Medications    clotrimazole (LOTRIMIN) 1 % cream    Other Relevant Orders    Fungus Culture with Smear    Foot pain, unspecified laterality        Pes planus of both feet        Tinea pedis of both feet        Relevant Medications    clotrimazole (LOTRIMIN) 1 % cream       -Examined patient -Discussed treatment options for painful dystrophic nails and tinea pedis  -Fungal culture was obtained and sent to [facility] lab -Rx Clotrimazole cream to use daily and recommended Okeeffe's healthy feet to heels -Patient to return in 4 weeks for follow up evaluation and discussion of fungal culture results or sooner if symptoms worsen.  Landis Martins, DPM

## 2016-03-18 NOTE — Progress Notes (Signed)
Patient ID: [name], male   DOB: 07/26/1966, 50 y.o.   MRN: [id number]   Advanced HF Clinic Note PCP: Dr. Buelah Manis Cardiology: [name]  50 yo with history of nonischemic cardiomyopathy presents for cardiology followup.  Echo in 5/15 showed EF 30-35%.  The cardiomyopathy may be due to prior ETOH abuse, he now has quit.  Past cath showed no significant coronary disease.  He is morbidly obese.  He was on Xarelto for a PE in 5/15 for about a year and is now off. Repeat echo in 9/16 showed EF 20-25%.   He was admitted in 12/16 with palpitations => he was found to have PVCs. Initially, it was recommended that he start on amiodarone but he did not want to take it and is having only rare PVCs now.   He is still smoking rare cigarettes but not drinking ETOH.  He has seen EP, have decided against ICD in setting of nonischemic CMP and morbid obesity.    He feels like his breathing has been getting worse for the last few weeks.  Weight is up 6 lbs.  He is only taking losartan 50 mg once daily. He is short of breath walking 100-200 feet or going up an incline. He is using CPAP at night.  No chest pain.  Having a lot of low back pain.  Also with pain in his left foot, going to see podiatry soon.  Noting right > left leg edema.    Labs (5/13): K 4.1, creatinine 1.05 Labs (1/14): K 3.8, creatinine 1.16, BNP 54 Labs (2/14): K 3.7, creatinine 1.11, BNP 28 Labs (2/16): K 4.1, creatinine 1.03, LDL 96, HCT 40 Labs (3/16): K 3.7, K 1.13, BNP 367 Labs (8/16): BNP 43, K 3.5, creatinine 1.01 Labs (08/13/15): K 3.7, creatinine 1.18, HCT 41.3 Labs (12/16): K 3.7, creatinine 1.14, TGs 495, digoxin < 0.2 Labs (2/17): K 3.8, creatinine 0.99, LDL 107, TGs 222  PMH: 1. Nonischemic cardiomyopathy: Prior cath with no significant CAD.  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  Prior echoes with EF as low as 25%.  Echo (9/13) with EF 35-40%, moderate to severe LV dilation, diffuse  hypokinesis, mild MR. Echo (5/15) with EF 30-35%, moderate to severe LAE, normal RV size and systolic function.  Angioedema with ACEI, headaches with hydralazine/nitrates. Echo (3/16) with EF 25-30%, severe LV dilation, normal RV size and systolic function.  [facility] 08/13/15 showed no significant coronary disease; RA mean 6, PA 33/11 mean 23, PCWP mean 13, Fick CO/CI 4.75 /1.68 (difficult study, radial artery spasm, if needs future cath would use groin).  Echo (9/16) showed EF 20-25%.   2. HTN: angioedema with ACEI.  3. OSA: on Bipap 4. Morbid obesity 5. Paroxysmal atrial fibrillation: Not documented recently.   6. Smoker.  7. Anxiety/panic attacks 8. PE: 5/15, diagnosed by V/Q scan. CTA chest 8/16 negative for PE.  9. NSVT, PVCs: 30 day monitor (12/16) with PVCs, PACs, no atrial fibrillation.  10. Hematuria: Apparently had negative workup by urology.  11. ABIs (6/16) were normal 12. 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  13. Gout 14. Low back pain.   SH: On disability, lives in [place], prior ETOH abuse but now dose not drink, no drugs, smokes 1 cig/day.  Has son and daughter.    FH: No premature CAD.    ROS: All systems reviewed and negative except as per HPI.   Current Outpatient Prescriptions  Medication Sig Dispense Refill  .  aspirin EC 81 MG tablet Take 1 tablet (81 mg total) by mouth daily. 90 tablet 3  . clonazePAM (KLONOPIN) 1 MG tablet Take 1 mg by mouth 3 (three) times daily as needed for anxiety.    . fenofibrate 54 MG tablet Take 1 tablet (54 mg total) by mouth daily. 90 tablet 3  . losartan (COZAAR) 50 MG tablet Take 1 tab in AM and 1/2 tab in PM 45 tablet 3  . magnesium oxide (MAG-OX) 400 MG tablet Take 1 tablet (400 mg total) by mouth daily. 30 tablet 11  . metoprolol succinate (TOPROL-XL) 100 MG 24 hr tablet Take 1 tablet (100 mg total) by mouth daily. 30 tablet 6  . NON FORMULARY Bipap, pressure 16/12 with 2L of O2    . oxyCODONE-acetaminophen (ROXICET) 5-325 MG tablet  Take 1 tablet by mouth every 8 (eight) hours as needed for severe pain. 20 tablet 0  . pantoprazole (PROTONIX) 40 MG tablet TAKE 1 TABLET (40 MG TOTAL) BY MOUTH DAILY. 30 tablet 6  . potassium chloride SA (K-DUR,KLOR-CON) 20 MEQ tablet Take 2 tablets (40 mEq total) by mouth daily. 60 tablet 6  . spironolactone (ALDACTONE) 25 MG tablet Take 1 tablet (25 mg total) by mouth daily. 30 tablet 6  . thiamine (VITAMIN B-1) 100 MG tablet Take 100 mg by mouth daily.    Marland Kitchen torsemide (DEMADEX) 20 MG tablet Take 2 tablets (40 mg total) by mouth daily. 60 tablet 3  . clotrimazole (LOTRIMIN) 1 % cream Apply 1 application topically 2 (two) times daily. 30 g 0  . sildenafil (VIAGRA) 25 MG tablet Take 1 tablet (25 mg total) by mouth daily as needed for erectile dysfunction. (Patient not taking: Reported on 03/18/2016) 30 tablet 1  . [DISCONTINUED] pravastatin (PRAVACHOL) 40 MG tablet Take 40 mg by mouth daily.    . [DISCONTINUED] sertraline (ZOLOFT) 50 MG tablet Take 1 tablet (50 mg total) by mouth daily. Take  50mg  daily for 1 week then increase to 100mg  daily 60 tablet 2   No current facility-administered medications for this encounter.     BP 114/76 mmHg  Pulse 73  Wt 424 lb (192.325 kg)  SpO2 95%   General: NAD, obese.  Neck: Thick, JVP 8 cm, no thyromegaly or thyroid nodule.  Lungs: Clear to auscultation bilaterally with normal respiratory effort. CV: Nondisplaced PMI.  Heart regular S1/S2, no S3/S4, no murmur.  1+ edema lower legs R>L.  No carotid bruit.  Normal pedal pulses.  Abdomen: Soft, nontender, unable to palpate liver edge, mild distention.  Neurologic: Alert and oriented x 3.  Psych: Normal affect. Extremities: No clubbing or cyanosis. R 1st MTP warm/tender.   Assessment/Plan: 1. Chronic systolic CHF: Nonischemic cardiomyopathy. Echo (9/16) with EF 20-25%. He was seen by EP and decided against ICD given marked obesity.  QRS not wide enough for CRT.  Feeling worse over the last few weeks.   NYHA class III symptoms.  Probably volume overloaded on exam (difficult exam) and weight is up 6 lbs.  - Increase torsemide to 40 mg daily.  Increase KCl to 40 daily. BMET today and in 10 days.  - Continue current Toprol XL and spironolactone.  - Increase losartan to 50 mg qam/25 mg qpm.  He had angioedema with ACEI, but the risk of angioedema in this situation with ARB use is low.  I think risk of recurrent angioedema with Delene Loll would be higher, so will not switch ARB to entresto.  - Continue to avoid ETOH.  -  Had side effects from digoxin, does not want to take. - Headaches with Bidil, cannot take.  2. Obesity: Needs to continue to work on diet/exercise for weight loss.  3. Smoking:  Needs to quit smoking altogether.   4. OSA: Continue CPAP.  5. Paroxysmal atrial fibrillation: Remote. He is no longer anticoagulated. Event monitor in 12/16 showed no atrial fibrillation.  6. PE: 5/15, diagnosed by V/Q scan.  He was on Xarelto for about a year but stopped in the setting of hematuria.  CTA chest in 8/16 did not show a PE.  Given history of venous thromboembolism and current R>L lower leg swelling, he should have a lower extremity venous doppler to r/o DVT.  I will arrange.  7. Low back pain: Avoid NSAIDs.  I will give him a small supply of tramadol for use until he can get back in with his PCP.   [name] 03/18/2016

## 2016-03-18 NOTE — Patient Instructions (Signed)
Okeeffe's healthy feet cream daily

## 2016-03-18 NOTE — Patient Instructions (Signed)
Increase Losartan to 50 mg (1 tab) in AM and 25 mg (1/2 tab) in PM  Increase Torsemide to 40 mg (2 tabs) daily  Increase Potassium to 40 meq (2 tabs) daily  Labs today  Labs in 10 days  Your physician has requested that you have a lower or upper extremity venous duplex. This test is an ultrasound of the veins in the legs or arms. It looks at venous blood flow that carries blood from the heart to the legs or arms. Allow one hour for a Lower Venous exam. Allow thirty minutes for an Upper Venous exam. There are no restrictions or special instructions.  Your physician recommends that you schedule a follow-up appointment in: 3 weeks

## 2016-03-18 NOTE — Progress Notes (Signed)
Advanced Heart Failure Medication Review by a Pharmacist  Does the patient  feel that his/her medications are working for him/her?  yes  Has the patient been experiencing any side effects to the medications prescribed?  no  Does the patient measure his/her own blood pressure or blood glucose at home?  no   Does the patient have any problems obtaining medications due to transportation or finances?   no  Understanding of regimen: fair Understanding of indications: fair Potential of compliance: fair Patient understands to avoid NSAIDs. Patient understands to avoid decongestants.  Issues to address at subsequent visits: None   Pharmacist comments: Mr. Tomson is a pleasant 50 yo M presenting without a medication list. He reports fair compliance with his medications. He also asked about assistance with the cost of his Viagra. Unfortunately, most insurance companies will not cover ED meds so have provided him with a coupon card.   Ruta Hinds. Velva Harman, PharmD, BCPS, CPP Clinical Pharmacist Pager: [phone] Phone: [phone] 03/18/2016 12:20 PM      Time with patient: 10 minutes Preparation and documentation time: 2 minutes Total time: 12 minutes

## 2016-03-19 ENCOUNTER — Ambulatory Visit (HOSPITAL_COMMUNITY)
Admission: RE | Admit: 2016-03-19 | Discharge: 2016-03-19 | Disposition: A | Discharge status: Home / Self Care | Payer: Medicare Other | Source: Ambulatory Visit | Attending: Cardiology | Admitting: Cardiology

## 2016-03-19 ENCOUNTER — Telehealth: Payer: Self-pay | Admitting: Cardiology

## 2016-03-19 DIAGNOSIS — L603 Nail dystrophy: Secondary | ICD-10-CM | POA: Diagnosis not present

## 2016-03-19 DIAGNOSIS — I5022 Chronic systolic (congestive) heart failure: Secondary | ICD-10-CM | POA: Diagnosis not present

## 2016-03-19 DIAGNOSIS — R609 Edema, unspecified: Secondary | ICD-10-CM | POA: Diagnosis not present

## 2016-03-19 DIAGNOSIS — L608 Other nail disorders: Secondary | ICD-10-CM | POA: Diagnosis not present

## 2016-03-19 NOTE — Telephone Encounter (Signed)
PER HELENE WITH [facility], TEST WAS NEGATIVE FOR DVT/CLOT

## 2016-03-19 NOTE — Progress Notes (Signed)
VASCULAR LAB PRELIMINARY  PRELIMINARY  PRELIMINARY  PRELIMINARY  Right lower extremity venous duplex  completed.    Preliminary report:  Right:  No evidence of DVT, superficial thrombosis, or Baker's cyst. Called doctor's office @3 :00pm  Vianca Bracher, RVT, RDMS 03/19/2016, 3:00 PM

## 2016-03-19 NOTE — Telephone Encounter (Signed)
[name] with the vascular lab at [facility] calling for results of LE-pls call

## 2016-03-28 ENCOUNTER — Other Ambulatory Visit (HOSPITAL_COMMUNITY): Payer: Medicare Other

## 2016-03-31 ENCOUNTER — Ambulatory Visit (HOSPITAL_COMMUNITY)
Admission: RE | Admit: 2016-03-31 | Discharge: 2016-03-31 | Disposition: A | Discharge status: Home / Self Care | Payer: Medicare Other | Source: Ambulatory Visit | Attending: Internal Medicine | Admitting: Internal Medicine

## 2016-03-31 DIAGNOSIS — I5022 Chronic systolic (congestive) heart failure: Secondary | ICD-10-CM | POA: Diagnosis not present

## 2016-03-31 DIAGNOSIS — M109 Gout, unspecified: Secondary | ICD-10-CM | POA: Diagnosis not present

## 2016-03-31 LAB — BASIC METABOLIC PANEL
ANION GAP: 11 (ref 5–15)
BUN: 21 mg/dL — ABNORMAL HIGH (ref 6–20)
CALCIUM: 9 mg/dL (ref 8.9–10.3)
CHLORIDE: 106 mmol/L (ref 101–111)
CO2: 23 mmol/L (ref 22–32)
CREATININE: 1.47 mg/dL — AB (ref 0.61–1.24)
GFR calc non Af Amer: 55 mL/min — ABNORMAL LOW (ref >60)
Glucose, Bld: 139 mg/dL — ABNORMAL HIGH (ref 65–99)
Potassium: 4 mmol/L (ref 3.5–5.1)
SODIUM: 140 mmol/L (ref 135–145)

## 2016-03-31 LAB — URIC ACID: URIC ACID, SERUM: 8.8 mg/dL — AB (ref 4.4–7.6)

## 2016-03-31 NOTE — Addendum Note (Signed)
Encounter addended by: Harvie Junior, CMA on: 03/31/2016 10:27 AM<BR>     Documentation filed: Visit Diagnoses, Dx Association, Orders

## 2016-04-01 LAB — HEMOGLOBIN A1C
Hgb A1c MFr Bld: 6.7 % — ABNORMAL HIGH (ref 4.8–5.6)
Mean Plasma Glucose: 146 mg/dL

## 2016-04-08 ENCOUNTER — Encounter (HOSPITAL_COMMUNITY): Payer: Self-pay

## 2016-04-08 ENCOUNTER — Ambulatory Visit (HOSPITAL_COMMUNITY)
Admission: RE | Admit: 2016-04-08 | Discharge: 2016-04-08 | Disposition: A | Discharge status: Home / Self Care | Payer: Medicare Other | Source: Ambulatory Visit | Attending: Cardiology | Admitting: Cardiology

## 2016-04-08 VITALS — BP 134/84 | HR 70 | Wt >= 6400 oz

## 2016-04-08 DIAGNOSIS — I5022 Chronic systolic (congestive) heart failure: Secondary | ICD-10-CM | POA: Diagnosis not present

## 2016-04-08 DIAGNOSIS — I110 Hypertensive heart disease with heart failure: Secondary | ICD-10-CM | POA: Diagnosis not present

## 2016-04-08 DIAGNOSIS — G4733 Obstructive sleep apnea (adult) (pediatric): Secondary | ICD-10-CM | POA: Insufficient documentation

## 2016-04-08 DIAGNOSIS — F17210 Nicotine dependence, cigarettes, uncomplicated: Secondary | ICD-10-CM | POA: Insufficient documentation

## 2016-04-08 DIAGNOSIS — Z79899 Other long term (current) drug therapy: Secondary | ICD-10-CM | POA: Insufficient documentation

## 2016-04-08 DIAGNOSIS — Z7982 Long term (current) use of aspirin: Secondary | ICD-10-CM | POA: Insufficient documentation

## 2016-04-08 DIAGNOSIS — I480 Paroxysmal atrial fibrillation: Secondary | ICD-10-CM | POA: Diagnosis not present

## 2016-04-08 DIAGNOSIS — G629 Polyneuropathy, unspecified: Secondary | ICD-10-CM | POA: Diagnosis not present

## 2016-04-08 DIAGNOSIS — M545 Low back pain: Secondary | ICD-10-CM | POA: Diagnosis not present

## 2016-04-08 DIAGNOSIS — I428 Other cardiomyopathies: Secondary | ICD-10-CM | POA: Diagnosis not present

## 2016-04-08 LAB — BASIC METABOLIC PANEL
ANION GAP: 11 (ref 5–15)
BUN: 18 mg/dL (ref 6–20)
CALCIUM: 9.5 mg/dL (ref 8.9–10.3)
CO2: 23 mmol/L (ref 22–32)
CREATININE: 1.3 mg/dL — AB (ref 0.61–1.24)
Chloride: 106 mmol/L (ref 101–111)
GFR calc non Af Amer: >60 mL/min (ref >60)
Glucose, Bld: 128 mg/dL — ABNORMAL HIGH (ref 65–99)
Potassium: 3.9 mmol/L (ref 3.5–5.1)
SODIUM: 140 mmol/L (ref 135–145)

## 2016-04-08 LAB — BRAIN NATRIURETIC PEPTIDE: B NATRIURETIC PEPTIDE 5: 24.5 pg/mL (ref 0.0–100.0)

## 2016-04-08 MED ORDER — METOPROLOL SUCCINATE ER 100 MG PO TB24: ORAL_TABLET | ORAL | Status: DC

## 2016-04-08 MED ORDER — LOSARTAN POTASSIUM 50 MG PO TABS: 50.0000 mg | ORAL_TABLET | Freq: Every day | ORAL | Status: DC

## 2016-04-08 MED ORDER — TORSEMIDE 20 MG PO TABS: 30.0000 mg | ORAL_TABLET | Freq: Every day | ORAL | Status: DC

## 2016-04-08 NOTE — Progress Notes (Signed)
Patient ID: [name], male   DOB: 03-01-1966, 50 y.o.   MRN: [id number]   Advanced HF Clinic Note PCP: Dr. Buelah Manis Cardiology: [name]  50 yo with history of nonischemic cardiomyopathy presents for cardiology followup.  Echo in 5/15 showed EF 30-35%.  The cardiomyopathy may be due to prior ETOH abuse, he now has quit.  Past cath showed no significant coronary disease.  He is morbidly obese.  He was on Xarelto for a PE in 5/15 for about a year and is now off. Repeat echo in 9/16 showed EF 20-25%.   He was admitted in 12/16 with palpitations => he was found to have PVCs. Initially, it was recommended that he start on amiodarone but he did not want to take it and is having only rare PVCs now.   He is still smoking rare cigarettes but not drinking ETOH.  He has seen EP, have decided against ICD in setting of nonischemic CMP and morbid obesity.    At last appointment, I thought he was volume overloaded.  I increased torsemide to 40 mg daily, but he is only taking 30 mg daily.  Weight is down 5 lbs.  He also did not increase losartan and is still taking 50 mg daily.  He is going to the gym most days.  He walks on treadmill for 30 minutes and uses weights.  Breathing is getting better, can walk on flat ground without dyspnea now.  No orthopnea/PND.     Labs (5/13): K 4.1, creatinine 1.05 Labs (1/14): K 3.8, creatinine 1.16, BNP 54 Labs (2/14): K 3.7, creatinine 1.11, BNP 28 Labs (2/16): K 4.1, creatinine 1.03, LDL 96, HCT 40 Labs (3/16): K 3.7, K 1.13, BNP 367 Labs (8/16): BNP 43, K 3.5, creatinine 1.01 Labs (08/13/15): K 3.7, creatinine 1.18, HCT 41.3 Labs (12/16): K 3.7, creatinine 1.14, TGs 495, digoxin < 0.2 Labs (2/17): K 3.8, creatinine 0.99, LDL 107, TGs 222 Labs (5/17): K 4, creatinine 1.47  PMH: 1. Nonischemic cardiomyopathy: Prior cath with no significant CAD.  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  Prior echoes with EF as low as 25%.   Echo (9/13) with EF 35-40%, moderate to severe LV dilation, diffuse hypokinesis, mild MR. Echo (5/15) with EF 30-35%, moderate to severe LAE, normal RV size and systolic function.  Angioedema with ACEI, headaches with hydralazine/nitrates. Echo (3/16) with EF 25-30%, severe LV dilation, normal RV size and systolic function.  [facility] 08/13/15 showed no significant coronary disease; RA mean 6, PA 33/11 mean 23, PCWP mean 13, Fick CO/CI 4.75 /1.68 (difficult study, radial artery spasm, if needs future cath would use groin).  Echo (9/16) showed EF 20-25%.   2. HTN: angioedema with ACEI.  3. OSA: on Bipap 4. Morbid obesity 5. Paroxysmal atrial fibrillation: Not documented recently.   6. Smoker.  7. Anxiety/panic attacks 8. PE: 5/15, diagnosed by V/Q scan. CTA chest 8/16 negative for PE.  9. NSVT, PVCs: 30 day monitor (12/16) with PVCs, PACs, no atrial fibrillation.  10. Hematuria: Apparently had negative workup by urology.  11. ABIs (6/16) were normal 12. 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  13. Gout 14. Low back pain.   SH: On disability, lives in [place], prior ETOH abuse but now dose not drink, no drugs, smokes 1 cig/day.  Has son and daughter.    FH: No premature CAD.    ROS: All systems reviewed and negative except as per HPI.   Current  Outpatient Prescriptions  Medication Sig Dispense Refill  . aspirin EC 81 MG tablet Take 1 tablet (81 mg total) by mouth daily. 90 tablet 3  . clonazePAM (KLONOPIN) 1 MG tablet Take 1 mg by mouth 3 (three) times daily as needed for anxiety.    . clotrimazole (LOTRIMIN) 1 % cream Apply 1 application topically 2 (two) times daily. 30 g 0  . fenofibrate 54 MG tablet Take 1 tablet (54 mg total) by mouth daily. 90 tablet 3  . losartan (COZAAR) 50 MG tablet Take 1 tablet (50 mg total) by mouth daily. Take 1 tab in AM and 1/2 tab in PM 30 tablet 3  . magnesium oxide (MAG-OX) 400 MG tablet Take 1 tablet (400 mg total) by mouth daily. 30 tablet 11  . metoprolol  succinate (TOPROL-XL) 100 MG 24 hr tablet Take 100mg  (1 tablet) in the morning and 50mg  (1/2) tablet in the evening 45 tablet 6  . NON FORMULARY Bipap, pressure 16/12 with 2L of O2    . oxyCODONE-acetaminophen (ROXICET) 5-325 MG tablet Take 1 tablet by mouth every 8 (eight) hours as needed for severe pain. 20 tablet 0  . pantoprazole (PROTONIX) 40 MG tablet TAKE 1 TABLET (40 MG TOTAL) BY MOUTH DAILY. 30 tablet 6  . potassium chloride SA (K-DUR,KLOR-CON) 20 MEQ tablet Take 2 tablets (40 mEq total) by mouth daily. 60 tablet 6  . sildenafil (VIAGRA) 25 MG tablet Take 1 tablet (25 mg total) by mouth daily as needed for erectile dysfunction. 30 tablet 1  . spironolactone (ALDACTONE) 25 MG tablet Take 1 tablet (25 mg total) by mouth daily. 30 tablet 6  . thiamine (VITAMIN B-1) 100 MG tablet Take 100 mg by mouth daily.    Marland Kitchen torsemide (DEMADEX) 20 MG tablet Take 1.5 tablets (30 mg total) by mouth daily. 60 tablet 3  . [DISCONTINUED] pravastatin (PRAVACHOL) 40 MG tablet Take 40 mg by mouth daily.    . [DISCONTINUED] sertraline (ZOLOFT) 50 MG tablet Take 1 tablet (50 mg total) by mouth daily. Take  50mg  daily for 1 week then increase to 100mg  daily 60 tablet 2   No current facility-administered medications for this encounter.     BP 134/84 mmHg  Pulse 70  Wt 417 lb 8 oz (189.377 kg)  SpO2 99%   General: NAD, obese.  Neck: Thick, JVP 7 cm, no thyromegaly or thyroid nodule.  Lungs: Clear to auscultation bilaterally with normal respiratory effort. CV: Nondisplaced PMI.  Heart regular S1/S2, no S3/S4, no murmur.  No edema.  No carotid bruit.  Normal pedal pulses.  Abdomen: Soft, nontender, unable to palpate liver edge, mild distention.  Neurologic: Alert and oriented x 3.  Psych: Normal affect. Extremities: No clubbing or cyanosis. R 1st MTP warm/tender.   Assessment/Plan: 1. Chronic systolic CHF: Nonischemic cardiomyopathy. Echo (9/16) with EF 20-25%. He was seen by EP and decided against ICD given  marked obesity.  QRS not wide enough for CRT.  Seems to be doing better overall.  NYHA class II symptoms.  Weight down.  Working out daily.  Not volume overloaded on exam.  - Can continue torsemide at 30 mg daily. BMET/BNP today.  - Continue current spironolactone and continue losartan at 50 mg daily.  I will no increase given rise in creatinine when most recently checked. He had angioedema with ACEI, but the risk of angioedema in this situation with ARB use is low.  I think risk of recurrent angioedema with Delene Loll would be higher, so will  not switch ARB to entresto.  - Increase Toprol XL to 100 qam, 50 qpm.  - Continue to avoid ETOH.  - Had side effects from digoxin, does not want to take. - Headaches with Bidil, cannot take.  2. Obesity: Needs to continue to work on diet/exercise for weight loss.  3. Smoking:  Needs to quit smoking altogether.   4. OSA: Continue CPAP.  5. Paroxysmal atrial fibrillation: Remote. He is no longer anticoagulated. Event monitor in 12/16 showed no atrial fibrillation.  6. PE: 5/15, diagnosed by V/Q scan.  He was on Xarelto for about a year but stopped in the setting of hematuria.  CTA chest in 8/16 did not show a PE.  Lower extremity venous US in 4/17 when his legs were swelling did not show a DVT.   [name] 04/08/2016

## 2016-04-08 NOTE — Patient Instructions (Signed)
Take Metoprolol 71mmg (1 tablet) in the morning and 50mg  (1/2 tablet) in the evening.  Take Losartan 50mg  daily.  Take Torsemide 30mg  daily.  Routine lab work today. Will notify you of abnormal results  Follow up with Dr.McLean in 3 months

## 2016-04-09 ENCOUNTER — Encounter: Payer: Self-pay | Admitting: Pulmonary Disease

## 2016-04-15 ENCOUNTER — Telehealth: Payer: Self-pay | Admitting: *Deleted

## 2016-04-15 ENCOUNTER — Ambulatory Visit (INDEPENDENT_AMBULATORY_CARE_PROVIDER_SITE_OTHER): Payer: Medicare Other | Admitting: Sports Medicine

## 2016-04-15 ENCOUNTER — Telehealth (HOSPITAL_COMMUNITY): Payer: Self-pay | Admitting: *Deleted

## 2016-04-15 ENCOUNTER — Encounter: Payer: Self-pay | Admitting: Sports Medicine

## 2016-04-15 DIAGNOSIS — B353 Tinea pedis: Secondary | ICD-10-CM | POA: Diagnosis not present

## 2016-04-15 DIAGNOSIS — M79673 Pain in unspecified foot: Secondary | ICD-10-CM

## 2016-04-15 DIAGNOSIS — L603 Nail dystrophy: Secondary | ICD-10-CM

## 2016-04-15 DIAGNOSIS — M2142 Flat foot [pes planus] (acquired), left foot: Secondary | ICD-10-CM | POA: Diagnosis not present

## 2016-04-15 DIAGNOSIS — M2141 Flat foot [pes planus] (acquired), right foot: Secondary | ICD-10-CM

## 2016-04-15 MED ORDER — NUVAIL EX SOLN: 1.0000 [drp] | Freq: Every day | CUTANEOUS | Status: DC

## 2016-04-15 NOTE — Telephone Encounter (Signed)
Pt called to report increased SOB, he states his scale is broke so he can not weight himself but does not feel like he is swollen any.  He states this started yesterday and has really been bad today, he was at the gym and had to stop treadmil to catch his breath which is unlike him.  He states he is also stressed and feels like he is on edge of panic attack, he has taken Klonopin to help this.  He will go ahead and take 2 Torsemide tabs instead of his 1.5 tabs today and will call back in AM with update

## 2016-04-15 NOTE — Patient Instructions (Signed)
Okeeffe's healthy feet cream daily to tops and bottom of both feet  Will send Nuvail topical to apply to all nails daily; after 1 week fine with nail file

## 2016-04-15 NOTE — Telephone Encounter (Addendum)
-----   Message from Landis Martins, [place] sent at 04/15/2016  2:49 PM EDT ----- Regarding: Nuvail Please Rx Nuvail  Thanks Dr. Cannon Kettle. Orders to [facility] ofr Nuvail.

## 2016-04-15 NOTE — Progress Notes (Signed)
Patient ID: [name], male   DOB: [birth year], 50 y.o.   MRN: [id number] Subjective: [name] is a 50 y.o. male patient seen today in office for fungal culture results. Patient has no other pedal complaints at this time.   Patient Active Problem List   Diagnosis Date Noted  . Osteoarthritis of toe joint 02/25/2016  . Palpitations   . Paroxysmal atrial fibrillation (HCC)   . Morbid obesity due to excess calories ([place])   . Palpitation 11/12/2015  . Chronic anticoagulation, secondary to PE 03/2014 12/27/2014  . Osteoarthritis of both knees 12/27/2014  . Chronic systolic CHF (congestive heart failure) ([place])   . Acute pulmonary embolism ([place]) 05/01/2014  . Hyperlipidemia 02/07/2014  . Non compliance w medication regimen 02/07/2014  . NSVT (nonsustained ventricular tachycardia) ([place]) 09/26/2013  . Alcohol abuse 08/10/2013  . Gout attack 06/29/2013  . Nonischemic cardiomyopathy ([place]) 01/26/2013  . Pain in joint, shoulder region 01/18/2013  . Diabetes mellitus ([place]) 04/13/2012  . Paresthesia and pain of extremity 04/13/2012  . Back pain 04/13/2012  . SOB (shortness of breath) 02/18/2012  . Tobacco user 02/18/2012  . Bilateral leg edema 12/21/2011  . Migraine 08/10/2011  . ED (erectile dysfunction) 07/11/2011  . Insomnia 07/11/2011  . Major depression ([place]) 05/08/2011  . ATRIAL FIBRILLATION, PAROXYSMAL 09/18/2010  . Anxiety state 06/12/2010  . Morbid obesity ([place]) 06/11/2010  . Essential hypertension 06/11/2010  . Systolic CHF, chronic ([place]) 06/11/2010  . ESOPHAGEAL REFLUX 06/11/2010  . OSA (obstructive sleep apnea) with BiPap and oxygen 06/11/2010    Current Outpatient Prescriptions on File Prior to Visit  Medication Sig Dispense Refill  . aspirin EC 81 MG tablet Take 1 tablet (81 mg total) by mouth daily. 90 tablet 3  . clonazePAM (KLONOPIN) 1 MG tablet Take 1 mg by mouth 3 (three) times daily as needed for anxiety.    . clotrimazole (LOTRIMIN) 1 % cream Apply 1  application topically 2 (two) times daily. 30 g 0  . fenofibrate 54 MG tablet Take 1 tablet (54 mg total) by mouth daily. 90 tablet 3  . losartan (COZAAR) 50 MG tablet Take 1 tablet (50 mg total) by mouth daily. Take 1 tab in AM and 1/2 tab in PM 30 tablet 3  . magnesium oxide (MAG-OX) 400 MG tablet Take 1 tablet (400 mg total) by mouth daily. 30 tablet 11  . metoprolol succinate (TOPROL-XL) 100 MG 24 hr tablet Take 100mg  (1 tablet) in the morning and 50mg  (1/2) tablet in the evening 45 tablet 6  . NON FORMULARY Bipap, pressure 16/12 with 2L of O2    . oxyCODONE-acetaminophen (ROXICET) 5-325 MG tablet Take 1 tablet by mouth every 8 (eight) hours as needed for severe pain. 20 tablet 0  . pantoprazole (PROTONIX) 40 MG tablet TAKE 1 TABLET (40 MG TOTAL) BY MOUTH DAILY. 30 tablet 6  . potassium chloride SA (K-DUR,KLOR-CON) 20 MEQ tablet Take 2 tablets (40 mEq total) by mouth daily. 60 tablet 6  . sildenafil (VIAGRA) 25 MG tablet Take 1 tablet (25 mg total) by mouth daily as needed for erectile dysfunction. 30 tablet 1  . spironolactone (ALDACTONE) 25 MG tablet Take 1 tablet (25 mg total) by mouth daily. 30 tablet 6  . thiamine (VITAMIN B-1) 100 MG tablet Take 100 mg by mouth daily.    Marland Kitchen torsemide (DEMADEX) 20 MG tablet Take 1.5 tablets (30 mg total) by mouth daily. 60 tablet 3  . [DISCONTINUED] pravastatin (PRAVACHOL) 40 MG tablet Take 40  mg by mouth daily.    . [DISCONTINUED] sertraline (ZOLOFT) 50 MG tablet Take 1 tablet (50 mg total) by mouth daily. Take  50mg  daily for 1 week then increase to 100mg  daily 60 tablet 2   No current facility-administered medications on file prior to visit.    Allergies  Allergen Reactions  . Ace Inhibitors Anaphylaxis and Swelling  . Buspirone Other (See Comments)    dizziness    Objective: Physical Exam  General: Well developed, nourished, no acute distress, awake, alert and oriented x 3  Vascular: Dorsalis pedis artery 2/4 bilateral, Posterior tibial  artery 1/4 bilateral, skin temperature warm to warm proximal to distal bilateral lower extremities, no varicosities, pedal hair present bilateral.  Neurological: Gross sensation present via light touch bilateral.   Dermatological: Skin is warm, dry, and supple bilateral, Nails 1-10 are tender, short thick, and discolored with mild subungal debris, no webspace macerations present bilateral, + scaly skin in annular fashion improved in nature and dry skin to heels, no open lesions present bilateral, no callus/corns/hyperkeratotic tissue present bilateral. No signs of infection bilateral.  Musculoskeletal: Pes planus foot type noted bilateral. Muscular strength within normal limits without painon range of motion. No pain with calf compression bilateral.  Fungal culture: microtrauma  Assessment and Plan:  Problem List Items Addressed This Visit    None    Visit Diagnoses    Nail dystrophy    -  Primary    Fungal culture: microtrauma    Tinea pedis of both feet        Pes planus of both feet        Foot pain, unspecified laterality           -Examined patient -Discussed treatment options for painful dystrophic nails -Patient opt for Nuvail; Rx requested -Advised to make sure shoes are properly fitting to prevent bio-mechancial microtrauma to nails -Advised patient to cont with Lotrimin cream daily -Advised good hygiene habits -Encouraged patient to use Okeeffe healthy feet cream to both heels -Patient to return as needed for follow up evaluation or sooner if symptoms worsen.  Landis Martins, DPM

## 2016-04-22 ENCOUNTER — Encounter: Payer: Self-pay | Admitting: Sports Medicine

## 2016-05-03 ENCOUNTER — Other Ambulatory Visit: Payer: Self-pay | Admitting: Family Medicine

## 2016-05-05 NOTE — Telephone Encounter (Signed)
Clonazepam refill request.  Last seen 02/25/2016, last filled 01/07/2016.  Please advise.

## 2016-05-05 NOTE — Telephone Encounter (Signed)
Okay to refill? 

## 2016-06-13 ENCOUNTER — Other Ambulatory Visit: Payer: Self-pay | Admitting: *Deleted

## 2016-06-13 MED ORDER — METOPROLOL SUCCINATE ER 100 MG PO TB24: ORAL_TABLET | ORAL | Status: DC

## 2016-06-13 NOTE — Telephone Encounter (Signed)
Received fax requesting refill on Metoprolol.   Refill appropriate and filled per protocol. 

## 2016-06-27 ENCOUNTER — Telehealth (HOSPITAL_COMMUNITY): Payer: Self-pay | Admitting: *Deleted

## 2016-06-27 ENCOUNTER — Other Ambulatory Visit: Payer: Self-pay | Admitting: *Deleted

## 2016-06-27 MED ORDER — METOPROLOL SUCCINATE ER 100 MG PO TB24: ORAL_TABLET | ORAL | 6 refills | Status: DC

## 2016-06-27 MED ORDER — SILDENAFIL CITRATE 20 MG PO TABS: 20.0000 mg | ORAL_TABLET | Freq: Every day | ORAL | 0 refills | Status: DC | PRN

## 2016-06-27 NOTE — Telephone Encounter (Signed)
Pt called to ask if we could change his viagra rx to generic 20 mg per his pharmacy it would be cheaper.  Ask Amy NP and she said its ok.  RX in epic and sent over to [facility]. Pt aware, only gave 15 tablets.  No refills.

## 2016-06-27 NOTE — Telephone Encounter (Signed)
Received fax requesting refill on Toprol.   Refill appropriate and filled per protocol. 

## 2016-07-16 ENCOUNTER — Ambulatory Visit (INDEPENDENT_AMBULATORY_CARE_PROVIDER_SITE_OTHER): Payer: Medicare Other | Admitting: Family Medicine

## 2016-07-16 ENCOUNTER — Encounter: Payer: Self-pay | Admitting: Family Medicine

## 2016-07-16 VITALS — BP 130/80 | HR 80 | Temp 98.5°F | Resp 22

## 2016-07-16 DIAGNOSIS — R229 Localized swelling, mass and lump, unspecified: Secondary | ICD-10-CM

## 2016-07-16 DIAGNOSIS — E0821 Diabetes mellitus due to underlying condition with diabetic nephropathy: Secondary | ICD-10-CM

## 2016-07-16 DIAGNOSIS — G609 Hereditary and idiopathic neuropathy, unspecified: Secondary | ICD-10-CM | POA: Diagnosis not present

## 2016-07-16 DIAGNOSIS — R5383 Other fatigue: Secondary | ICD-10-CM | POA: Diagnosis not present

## 2016-07-16 DIAGNOSIS — I10 Essential (primary) hypertension: Secondary | ICD-10-CM

## 2016-07-16 DIAGNOSIS — Z720 Tobacco use: Secondary | ICD-10-CM

## 2016-07-16 NOTE — Assessment & Plan Note (Signed)
Continues to smoke, not interested in quittting

## 2016-07-16 NOTE — Assessment & Plan Note (Signed)
I think his morbid obesity that the center of all of his chronic medical prostate along with his mood disorder and anxiety which she does not want treated. He has not taken any ownership of his medical problems as well as his noncompliance with his medical regimen. Chronically do not the house I truly can't help him if he is not considered changing any of his legs. I'll recheck his levels today with regards to the diabetes mellitus in his renal function. I do not see any reason to get a vitamin D level advised him that this may not be covered. 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 We'll check a B-12 level as well.  The importance of him changing his diet and trying to lose weight I do not think that he is interested in either of these. He is also back to drinking again which he seemed boastful about

## 2016-07-16 NOTE — Patient Instructions (Addendum)
Ultrasound to be done on your back We will call with lab results  Take topical cortisone over the counter 1%  F/U 4 months
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
## 2016-07-16 NOTE — Assessment & Plan Note (Signed)
Bp looks okay

## 2016-07-17 LAB — CBC WITH DIFFERENTIAL/PLATELET
Basophils Absolute: 0 cells/uL (ref 0–200)
Basophils Relative: 0 %
EOS PCT: 4 %
Eosinophils Absolute: 328 cells/uL (ref 15–500)
HCT: 42.5 % (ref 38.5–50.0)
HEMOGLOBIN: 14.4 g/dL (ref 13.0–17.0)
LYMPHS ABS: 4100 {cells}/uL — AB (ref 850–3900)
Lymphocytes Relative: 50 %
MCH: 28.9 pg (ref 27.0–33.0)
MCHC: 33.9 g/dL (ref 32.0–36.0)
MCV: 85.3 fL (ref 80.0–100.0)
MONOS PCT: 8 %
MPV: 10.1 fL (ref 7.5–12.5)
Monocytes Absolute: 656 cells/uL (ref 200–950)
NEUTROS ABS: 3116 {cells}/uL (ref 1500–7800)
NEUTROS PCT: 38 %
PLATELETS: 268 10*3/uL (ref 140–400)
RBC: 4.98 MIL/uL (ref 4.20–5.80)
RDW: 14.5 % (ref 11.0–15.0)
WBC: 8.2 10*3/uL (ref 3.8–10.8)

## 2016-07-17 LAB — COMPREHENSIVE METABOLIC PANEL
ALBUMIN: 3.7 g/dL (ref 3.6–5.1)
ALT: 18 U/L (ref 9–46)
AST: 14 U/L (ref 10–40)
Alkaline Phosphatase: 62 U/L (ref 40–115)
BILIRUBIN TOTAL: 0.6 mg/dL (ref 0.2–1.2)
BUN: 17 mg/dL (ref 7–25)
CO2: 22 mmol/L (ref 20–31)
CREATININE: 1.27 mg/dL (ref 0.60–1.35)
Calcium: 8.9 mg/dL (ref 8.6–10.3)
Chloride: 107 mmol/L (ref 98–110)
Glucose, Bld: 144 mg/dL — ABNORMAL HIGH (ref 70–99)
Potassium: 3.8 mmol/L (ref 3.5–5.3)
SODIUM: 139 mmol/L (ref 135–146)
Total Protein: 7.1 g/dL (ref 6.1–8.1)

## 2016-07-17 LAB — VITAMIN B12: Vitamin B-12: 1088 pg/mL (ref 200–1100)

## 2016-07-17 LAB — HEMOGLOBIN A1C
Hgb A1c MFr Bld: 6.1 % — ABNORMAL HIGH (ref <5.7)
MEAN PLASMA GLUCOSE: 128 mg/dL

## 2016-07-19 ENCOUNTER — Other Ambulatory Visit (HOSPITAL_COMMUNITY): Payer: Self-pay | Admitting: Cardiology

## 2016-07-30 ENCOUNTER — Other Ambulatory Visit (HOSPITAL_COMMUNITY): Payer: Self-pay | Admitting: Adult Health

## 2016-07-31 ENCOUNTER — Ambulatory Visit
Admission: RE | Admit: 2016-07-31 | Discharge: 2016-07-31 | Disposition: A | Discharge status: Home / Self Care | Payer: Medicare Other | Source: Ambulatory Visit | Attending: Family Medicine | Admitting: Family Medicine

## 2016-07-31 DIAGNOSIS — R222 Localized swelling, mass and lump, trunk: Secondary | ICD-10-CM | POA: Diagnosis not present

## 2016-07-31 DIAGNOSIS — R229 Localized swelling, mass and lump, unspecified: Secondary | ICD-10-CM

## 2016-08-07 ENCOUNTER — Other Ambulatory Visit: Payer: Self-pay | Admitting: Family Medicine

## 2016-08-07 ENCOUNTER — Telehealth: Payer: Self-pay | Admitting: Family Medicine

## 2016-08-07 DIAGNOSIS — R222 Localized swelling, mass and lump, trunk: Secondary | ICD-10-CM

## 2016-08-07 NOTE — Telephone Encounter (Signed)
Pt aware of ultrasound result.  MRI has been ordered.

## 2016-08-07 NOTE — Telephone Encounter (Signed)
-----   Message from Susy Frizzle, MD sent at 08/01/2016  7:29 AM EDT ----- Ultrasound does not confirm lipoma. They are not sure what the mass is. They recommend MRI with contrast of the chest wall. Please schedule

## 2016-08-21 ENCOUNTER — Ambulatory Visit
Admission: RE | Admit: 2016-08-21 | Discharge: 2016-08-21 | Disposition: A | Discharge status: Home / Self Care | Payer: Medicare Other | Source: Ambulatory Visit | Attending: Family Medicine | Admitting: Family Medicine

## 2016-08-21 DIAGNOSIS — R222 Localized swelling, mass and lump, trunk: Secondary | ICD-10-CM

## 2016-08-21 MED ORDER — GADOBENATE DIMEGLUMINE 529 MG/ML IV SOLN
20.0000 mL | Freq: Once | INTRAVENOUS | Status: AC | PRN
  Administered 2016-08-21: 20 mL via INTRAVENOUS

## 2016-08-22 ENCOUNTER — Ambulatory Visit (INDEPENDENT_AMBULATORY_CARE_PROVIDER_SITE_OTHER): Payer: Medicare Other | Admitting: Family Medicine

## 2016-08-22 VITALS — BP 142/68 | HR 88 | Temp 98.6°F | Resp 16

## 2016-08-22 DIAGNOSIS — D171 Benign lipomatous neoplasm of skin and subcutaneous tissue of trunk: Secondary | ICD-10-CM | POA: Diagnosis not present

## 2016-08-22 NOTE — Progress Notes (Signed)
   Subjective:    Patient ID: [name], male    DOB: [birth year], 50 y.o.   MRN: [id number]  Patient presents for Follow-up (MRI results) For follow-up on his MRI. He had a palpable subcutaneous mass to the right of his thoracic spine ultrasound was done as I was concerned for lipoma the read was Soft tissue US IMPRESSION: 3.7 x 1.6 x 3.4 cm rounded isoechoic mass in the region of palpable abnormality to the right spine. This does not have the characteristic hyperechoic pattern seen with lipomas. To further evaluate gadolinium-enhanced MRI of the chest wall should be considered.  It was then recommended that an MRI was done IMPRESSION: 1. No soft tissue mass at the site of clinical palpable abnormality as marked by skin markers.  I actually called and spoke with the radiologist and had repeated both images. He states that the calories were placed around what look like a fatty lobule and there is no evidence of any mass or abnormality just fat/subcutaneous tissue   He was told this on the phone but was anxious and wanted to come in  Review Of Systems:  GEN- denies fatigue, fever, weight loss,weakness, recent illness HEENT- denies eye drainage, change in vision, nasal discharge, CVS- denies chest pain, palpitations RESP- denies SOB, cough, wheeze Neuro- denies headache, dizziness, syncope, seizure activity       Objective:    BP (!) 142/68 (BP Location: Left Arm, Patient Position: Sitting, Cuff Size: Large)   Pulse 88   Temp 98.6 F (37 C) (Oral)   Resp 16  GEN- NAD, alert and oriented x3         Assessment & Plan:      Problem List Items Addressed This Visit    Morbid obesity ([place])    Note he was given info about bariatric clinic at [facility] again, with number to call, seminar to schedule        Other Visit Diagnoses    Lipoma of back    -  Primary   Fatty lobule noted on MRI/ultrasound benign       Note: This dictation was prepared with Dragon dictation  along with smaller phrase technology. Any transcriptional errors that result from this process are unintentional.

## 2016-08-22 NOTE — Patient Instructions (Addendum)
F/U as previous [place] for a weight-loss seminar in [place], or watch our online webinar; for more information, call [phone]. [facility], [address] [place] [place] [address], [place] [address]  Main: [phone]

## 2016-08-24 NOTE — Assessment & Plan Note (Signed)
Note he was given info about bariatric clinic at [facility] again, with number to call, seminar to schedule

## 2016-09-04 ENCOUNTER — Encounter (HOSPITAL_COMMUNITY): Payer: Self-pay | Admitting: Emergency Medicine

## 2016-09-04 ENCOUNTER — Emergency Department (HOSPITAL_COMMUNITY)
Admission: EM | Admit: 2016-09-04 | Discharge: 2016-09-04 | Disposition: A | Discharge status: Home / Self Care | Payer: Medicare Other | Attending: Emergency Medicine | Admitting: Emergency Medicine

## 2016-09-04 ENCOUNTER — Emergency Department (HOSPITAL_COMMUNITY): Payer: Medicare Other

## 2016-09-04 DIAGNOSIS — Z7901 Long term (current) use of anticoagulants: Secondary | ICD-10-CM | POA: Insufficient documentation

## 2016-09-04 DIAGNOSIS — R079 Chest pain, unspecified: Secondary | ICD-10-CM

## 2016-09-04 DIAGNOSIS — F17210 Nicotine dependence, cigarettes, uncomplicated: Secondary | ICD-10-CM | POA: Insufficient documentation

## 2016-09-04 DIAGNOSIS — R0600 Dyspnea, unspecified: Secondary | ICD-10-CM | POA: Diagnosis present

## 2016-09-04 DIAGNOSIS — E119 Type 2 diabetes mellitus without complications: Secondary | ICD-10-CM | POA: Diagnosis not present

## 2016-09-04 DIAGNOSIS — R0789 Other chest pain: Secondary | ICD-10-CM | POA: Diagnosis not present

## 2016-09-04 DIAGNOSIS — I5022 Chronic systolic (congestive) heart failure: Secondary | ICD-10-CM | POA: Diagnosis not present

## 2016-09-04 DIAGNOSIS — I110 Hypertensive heart disease with heart failure: Secondary | ICD-10-CM | POA: Insufficient documentation

## 2016-09-04 DIAGNOSIS — Z7982 Long term (current) use of aspirin: Secondary | ICD-10-CM | POA: Diagnosis not present

## 2016-09-04 DIAGNOSIS — R0602 Shortness of breath: Secondary | ICD-10-CM | POA: Diagnosis not present

## 2016-09-04 DIAGNOSIS — R072 Precordial pain: Secondary | ICD-10-CM | POA: Diagnosis not present

## 2016-09-04 LAB — BASIC METABOLIC PANEL
ANION GAP: 9 (ref 5–15)
BUN: 12 mg/dL (ref 6–20)
CALCIUM: 8.8 mg/dL — AB (ref 8.9–10.3)
CO2: 26 mmol/L (ref 22–32)
CREATININE: 1.2 mg/dL (ref 0.61–1.24)
Chloride: 105 mmol/L (ref 101–111)
Glucose, Bld: 117 mg/dL — ABNORMAL HIGH (ref 65–99)
Potassium: 3.7 mmol/L (ref 3.5–5.1)
SODIUM: 140 mmol/L (ref 135–145)

## 2016-09-04 LAB — BRAIN NATRIURETIC PEPTIDE: B NATRIURETIC PEPTIDE 5: 41.8 pg/mL (ref 0.0–100.0)

## 2016-09-04 LAB — I-STAT ARTERIAL BLOOD GAS, ED
ACID-BASE DEFICIT: 2 mmol/L (ref 0.0–2.0)
Bicarbonate: 22 mmol/L (ref 20.0–28.0)
O2 SAT: 99 %
PH ART: 7.419 (ref 7.350–7.450)
PO2 ART: 128 mmHg — AB (ref 83.0–108.0)
TCO2: 23 mmol/L (ref 0–100)
pCO2 arterial: 34 mmHg (ref 32.0–48.0)

## 2016-09-04 LAB — CBC
HCT: 41.7 % (ref 39.0–52.0)
HEMOGLOBIN: 14.4 g/dL (ref 13.0–17.0)
MCH: 29.2 pg (ref 26.0–34.0)
MCHC: 34.5 g/dL (ref 30.0–36.0)
MCV: 84.6 fL (ref 78.0–100.0)
PLATELETS: 242 10*3/uL (ref 150–400)
RBC: 4.93 MIL/uL (ref 4.22–5.81)
RDW: 13.9 % (ref 11.5–15.5)
WBC: 13.2 10*3/uL — AB (ref 4.0–10.5)

## 2016-09-04 LAB — I-STAT TROPONIN, ED
TROPONIN I, POC: 0.05 ng/mL (ref 0.00–0.08)
TROPONIN I, POC: 0.04 ng/mL (ref 0.00–0.08)

## 2016-09-04 MED ORDER — FUROSEMIDE 10 MG/ML IJ SOLN
40.0000 mg | Freq: Once | INTRAMUSCULAR | Status: AC
  Administered 2016-09-04: 40 mg via INTRAVENOUS
  Filled 2016-09-04: qty 4

## 2016-09-04 MED ORDER — LORAZEPAM 2 MG/ML IJ SOLN
1.0000 mg | Freq: Once | INTRAMUSCULAR | Status: AC
  Administered 2016-09-04: 1 mg via INTRAVENOUS
  Filled 2016-09-04: qty 1

## 2016-09-04 MED ORDER — POTASSIUM CHLORIDE CRYS ER 20 MEQ PO TBCR
20.0000 meq | EXTENDED_RELEASE_TABLET | Freq: Once | ORAL | Status: AC
  Administered 2016-09-04: 20 meq via ORAL
  Filled 2016-09-04: qty 2

## 2016-09-04 MED ORDER — KETOROLAC TROMETHAMINE 30 MG/ML IJ SOLN
30.0000 mg | Freq: Once | INTRAMUSCULAR | Status: AC
  Administered 2016-09-04: 30 mg via INTRAVENOUS
  Filled 2016-09-04: qty 1

## 2016-09-04 MED ORDER — IOPAMIDOL (ISOVUE-370) INJECTION 76%
INTRAVENOUS | Status: AC
  Administered 2016-09-04: 100 mL
  Filled 2016-09-04: qty 100

## 2016-09-04 NOTE — Discharge Instructions (Signed)
Take ibuprofen 600 mg every 6 hours as needed for pain.  Return to the emergency department if you develop worsening pain, difficulty breathing, high fever, or other new and concerning symptoms. 

## 2016-09-04 NOTE — ED Notes (Signed)
Portable xray at bedside.

## 2016-09-04 NOTE — ED Notes (Signed)
Pt is on 2L [place] at this time. BiPap removed at this time. Pt tolerating well. o2 sat's 100% respirations 20. Breathing easily.

## 2016-09-04 NOTE — ED Provider Notes (Signed)
[facility] DEPT Provider Note   CSN: [id number] Arrival date & time: 09/04/16  [id number]     History   Chief Complaint Chief Complaint  Patient presents with  . Respiratory Distress    HPI [name] is a 50 y.o. male.  Patient is a 50 year old male with extensive past medical history including cardiomyopathy with CHF, prior pulmonary embolism, obesity, obstructive sleep apnea, and diabetes. He presents for evaluation of difficulty breathing that started during the night. He reports using BiPAP at night due to his OSA. He reports having difficulty taking a deep breath secondary to pain in the left side of his chest and left shoulder blade. He denies any fevers, chills, or productive cough. He does admit to working under his truck trying to change the oil yesterday, but denies any specific injury.  He has been off of his Xarelto since earlier this year, he believes January.    Shortness of Breath  This is a new problem. The average episode lasts 8 hours. The problem occurs continuously.The problem has been gradually worsening. Associated symptoms include chest pain. Pertinent negatives include no fever, no cough and no sputum production. He has tried nothing for the symptoms. He has had prior hospitalizations. Associated medical issues include PE and heart failure.    Past Medical History:  Diagnosis Date  . Alcohol abuse   . Anxiety state, unspecified   . Atrial fibrillation ([place])   . CHF (congestive heart failure) ([place])   . Chronic systolic heart failure ([place])   . Edema   . History of medication noncompliance   . Migraine   . Obesity, unspecified   . Obstructive sleep apnea   . Psychiatric disorder   . Pulmonary embolism ([place])   . Shortness of breath     Patient Active Problem List   Diagnosis Date Noted  . Fatigue 07/16/2016  . Hereditary and idiopathic peripheral neuropathy 07/16/2016  . Osteoarthritis of toe joint 02/25/2016  . Palpitations   . Paroxysmal  atrial fibrillation (H[facility])   . Morbid obesity due to excess calories ([place])   . Palpitation 11/12/2015  . Chronic anticoagulation, secondary to PE 03/2014 12/27/2014  . Osteoarthritis of both knees 12/27/2014  . Chronic systolic CHF (congestive heart failure) ([place])   . Acute pulmonary embolism ([place]) 05/01/2014  . Hyperlipidemia 02/07/2014  . Non compliance w medication regimen 02/07/2014  . NSVT (nonsustained ventricular tachycardia) ([place]) 09/26/2013  . Alcohol abuse 08/10/2013  . Gout attack 06/29/2013  . Nonischemic cardiomyopathy ([place]) 01/26/2013  . Pain in joint, shoulder region 01/18/2013  . Diabetes mellitus ([place]) 04/13/2012  . Paresthesia and pain of extremity 04/13/2012  . Back pain 04/13/2012  . SOB (shortness of breath) 02/18/2012  . Tobacco user 02/18/2012  . Bilateral leg edema 12/21/2011  . Migraine 08/10/2011  . ED (erectile dysfunction) 07/11/2011  . Insomnia 07/11/2011  . Major depression 05/08/2011  . ATRIAL FIBRILLATION, PAROXYSMAL 09/18/2010  . Anxiety state 06/12/2010  . Morbid obesity ([place]) 06/11/2010  . Essential hypertension 06/11/2010  . Systolic CHF, chronic ([place]) 06/11/2010  . ESOPHAGEAL REFLUX 06/11/2010  . OSA (obstructive sleep apnea) with BiPap and oxygen 06/11/2010    Past Surgical History:  Procedure Laterality Date  . CARDIAC CATHETERIZATION    . CARDIAC CATHETERIZATION N/A 08/13/2015   Procedure: Right/Left Heart Cath and Coronary Angiography;  Surgeon: [name], MD;  Location: [facility];  Service: Cardiovascular;  Laterality: N/A;  . TESTICLE SURGERY  Home Medications    Prior to Admission medications   Medication Sig Start Date End Date Taking? Authorizing Provider  aspirin EC 81 MG tablet Take 1 tablet (81 mg total) by mouth daily. 08/01/15   [name], MD  clonazePAM (KLONOPIN) 1 MG tablet TAKE 1 TABLET BY MOUTH 3 TIMES A DAY AS NEEDED FOR ANXIETY** MUST LAST 30 DAYS** 05/05/16   Alycia Rossetti, MD    clotrimazole (LOTRIMIN) 1 % cream Apply 1 application topically 2 (two) times daily. 03/18/16   Titorya Stover, DPM  fenofibrate 54 MG tablet Take 1 tablet (54 mg total) by mouth daily. 11/14/15   Shirley Friar, PA-C  losartan (COZAAR) 50 MG tablet TAKE 1 TABLET BY MOUTH IN THE MORNING AND 1/2 TABLET BY MOUTH IN THE EVENING 07/21/16   [name], MD  magnesium oxide (MAG-OX) 400 MG tablet Take 1 tablet (400 mg total) by mouth daily. 01/02/16   Deboraha Sprang, MD  metoprolol succinate (TOPROL-XL) 100 MG 24 hr tablet Take 100mg  (1 tablet) in the morning and 50mg  (1/2) tablet in the evening 06/27/16   Alycia Rossetti, MD  NON FORMULARY Bipap, pressure 16/12 with 2L of O2    Historical Provider, MD  oxyCODONE-acetaminophen (ROXICET) 5-325 MG tablet Take 1 tablet by mouth every 8 (eight) hours as needed for severe pain. 02/25/16   Alycia Rossetti, MD  pantoprazole (PROTONIX) 40 MG tablet TAKE 1 TABLET (40 MG TOTAL) BY MOUTH DAILY. 12/25/15   Alycia Rossetti, MD  potassium chloride SA (K-DUR,KLOR-CON) 20 MEQ tablet Take 2 tablets (40 mEq total) by mouth daily. 03/18/16   [name], MD  sildenafil (REVATIO) 20 MG tablet TAKE 1 TABLET (20 MG TOTAL) BY MOUTH DAILY AS NEEDED. 07/30/16   [name], NP  spironolactone (ALDACTONE) 25 MG tablet Take 1 tablet (25 mg total) by mouth daily. 12/25/15   Alycia Rossetti, MD  thiamine (VITAMIN B-1) 100 MG tablet Take 100 mg by mouth daily.    Historical Provider, MD  torsemide (DEMADEX) 20 MG tablet Take 1.5 tablets (30 mg total) by mouth daily. 04/08/16   [name], MD    Family History Family History  Problem Relation Age of Onset  . Cancer Mother     brain tumor  . Hypertension Mother   . Diabetes Father     Deceased, 52  . Heart disease Maternal Grandmother   . Hypertension      Family History  . Stroke      Family History  . Diabetes      Family History  . Diabetes Daughter     Social History Social History  Substance Use  Topics  . Smoking status: Current Every Day Smoker    Packs/day: 0.50    Years: 30.00    Types: Cigarettes  . Smokeless tobacco: Never Used     Comment: 10/2014- using patches  . Alcohol use 0.0 oz/week     Comment: nothing to drink since last OV with Dr. Harl Bowie per patient     Allergies   Ace inhibitors and Buspirone   Review of Systems Review of Systems  Constitutional: Negative for fever.  Respiratory: Positive for shortness of breath. Negative for cough and sputum production.   Cardiovascular: Positive for chest pain.  All other systems reviewed and are negative.    Physical Exam Updated Vital Signs BP 133/86 (BP Location: Left Arm)   Pulse 76   Temp 98.3 F (36.8  C) (Axillary)   Resp 22   SpO2 100%   Physical Exam  Constitutional: He is oriented to person, place, and time. He appears well-developed and well-nourished. No distress.  HENT:  Head: Normocephalic and atraumatic.  Mouth/Throat: Oropharynx is clear and moist.  Neck: Normal range of motion. Neck supple.  Cardiovascular: Normal rate and regular rhythm.  Exam reveals no friction rub.   No murmur heard. Pulmonary/Chest: Effort normal and breath sounds normal. No respiratory distress. He has no wheezes. He has no rales.  Abdominal: Soft. Bowel sounds are normal. He exhibits no distension. There is no tenderness.  Musculoskeletal: Normal range of motion. He exhibits edema.  Patient appears of chronic lymphedema.  Neurological: He is alert and oriented to person, place, and time. Coordination normal.  Skin: Skin is warm and dry. He is not diaphoretic.  Nursing note and vitals reviewed.    ED Treatments / Results  Labs (all labs ordered are listed, but only abnormal results are displayed) Labs Reviewed  BASIC METABOLIC PANEL  CBC  BRAIN NATRIURETIC PEPTIDE  BLOOD GAS, ARTERIAL  I-STAT [place], ED    EKG  EKG Interpretation  Date/Time:  Thursday September 04 2016 07:20:00 EDT Ventricular Rate:    77 PR Interval:    QRS Duration: 116 QT Interval:  376 QTC Calculation: 426 R Axis:   72 Text Interpretation:  Sinus rhythm Incomplete left bundle branch block Confirmed by Dezi Schaner  MD, Crystal Scarberry ([id number]) on 09/04/2016 7:26:45 AM       Radiology No results found.  Procedures Procedures (including critical care time)  Medications Ordered in ED Medications  furosemide (LASIX) injection 40 mg (not administered)     Initial Impression / Assessment and Plan / ED Course  I have reviewed the triage vital signs and the nursing notes.  Pertinent labs & imaging results that were available during my care of the patient were reviewed by me and considered in my medical decision making (see chart for details).  Clinical Course    Patient is a 50 year old male with the above medical history. He presents with pain in his left chest that began in the night. It feels sharp, but also feels as though something is sitting on his chest. It is worse with movement, palpation, and change in position. His pain is also worse with deep breathing, however there is no fever or cough.  His initial EKG is unchanged and troponin 2 are both negative. A CT scan of the chest to rule out pulmonary embolism was obtained. This revealed no evidence for clot within the major pulmonary arteries. There was suboptimal visualization of the more distal arteries. There is no hypoxia and I doubt pulmonary embolism. He will be discharged with instructions to take ibuprofen, rest, and return as needed.  His oxygen saturations while I have interacted with him have been 100% and respiratory rate is normal. There is no tachycardia.   He does understand to return if his symptoms worsen or change.  Final Clinical Impressions(s) / ED Diagnoses   Final diagnoses:  None    New Prescriptions New Prescriptions   No medications on file     Veryl Speak, MD 09/04/16 1209

## 2016-09-04 NOTE — ED Notes (Signed)
Transported pt to CT with RT. Pt back in room in ED at this time with wife at bedside.

## 2016-09-04 NOTE — ED Triage Notes (Signed)
Pt from home by [facility] after waking up at 630am with substernal chest back radiating into his back and down left arm. Pt wears BiPap at night and was still experiencing sob and chest pain. Pt took 324mg  ASA at home and ems gave 1 nitro tablet and pt arrives to ED on Cpap. Respiratory at bedside and placed pt on bipap on arrival. Pt is able to speak in complete sentences on bipap.

## 2016-09-04 NOTE — ED Notes (Signed)
MD at bedside. [name]

## 2016-09-06 NOTE — ED Notes (Signed)
Ativan wasted in sink with [facility] RN 1mg . Medication accidentally not wasted by this RN on day of pt care.

## 2016-09-14 ENCOUNTER — Other Ambulatory Visit (HOSPITAL_COMMUNITY): Payer: Self-pay | Admitting: Cardiology

## 2016-09-14 ENCOUNTER — Other Ambulatory Visit: Payer: Self-pay | Admitting: Family Medicine

## 2016-09-16 ENCOUNTER — Other Ambulatory Visit: Payer: Self-pay | Admitting: Family Medicine

## 2016-09-16 NOTE — Telephone Encounter (Signed)
Ok to refill??  Last office visit 08/22/2016.  Last refill 05/05/2016, #2 refills.

## 2016-09-16 NOTE — Telephone Encounter (Signed)
okay

## 2016-09-17 NOTE — Telephone Encounter (Signed)
Medication called to pharmacy. 

## 2016-09-20 ENCOUNTER — Other Ambulatory Visit: Payer: Self-pay | Admitting: Family Medicine

## 2017-01-13 ENCOUNTER — Telehealth (HOSPITAL_COMMUNITY): Payer: Self-pay | Admitting: Vascular Surgery

## 2017-01-13 NOTE — Telephone Encounter (Signed)
Returned pt call to make f/u appt 

## 2017-01-15 ENCOUNTER — Ambulatory Visit (INDEPENDENT_AMBULATORY_CARE_PROVIDER_SITE_OTHER): Payer: Medicare Other | Admitting: Family Medicine

## 2017-01-15 ENCOUNTER — Encounter: Payer: Self-pay | Admitting: Family Medicine

## 2017-01-15 VITALS — BP 148/90 | HR 74 | Temp 98.3°F | Resp 18 | Ht 68.5 in | Wt >= 6400 oz

## 2017-01-15 DIAGNOSIS — F430 Acute stress reaction: Secondary | ICD-10-CM

## 2017-01-15 DIAGNOSIS — F331 Major depressive disorder, recurrent, moderate: Secondary | ICD-10-CM

## 2017-01-15 DIAGNOSIS — I10 Essential (primary) hypertension: Secondary | ICD-10-CM | POA: Diagnosis not present

## 2017-01-15 DIAGNOSIS — E0821 Diabetes mellitus due to underlying condition with diabetic nephropathy: Secondary | ICD-10-CM | POA: Diagnosis not present

## 2017-01-15 DIAGNOSIS — F410 Panic disorder [episodic paroxysmal anxiety] without agoraphobia: Secondary | ICD-10-CM | POA: Diagnosis not present

## 2017-01-15 DIAGNOSIS — F411 Generalized anxiety disorder: Secondary | ICD-10-CM | POA: Diagnosis not present

## 2017-01-15 DIAGNOSIS — J069 Acute upper respiratory infection, unspecified: Secondary | ICD-10-CM

## 2017-01-15 DIAGNOSIS — I5022 Chronic systolic (congestive) heart failure: Secondary | ICD-10-CM

## 2017-01-15 LAB — CBC WITH DIFFERENTIAL/PLATELET
Basophils Absolute: 86 cells/uL (ref 0–200)
Basophils Relative: 1 %
EOS ABS: 258 {cells}/uL (ref 15–500)
Eosinophils Relative: 3 %
HCT: 43.8 % (ref 38.5–50.0)
HEMOGLOBIN: 15.1 g/dL (ref 13.0–17.0)
LYMPHS ABS: 3956 {cells}/uL — AB (ref 850–3900)
Lymphocytes Relative: 46 %
MCH: 29.2 pg (ref 27.0–33.0)
MCHC: 34.5 g/dL (ref 32.0–36.0)
MCV: 84.6 fL (ref 80.0–100.0)
MONOS PCT: 10 %
MPV: 10.2 fL (ref 7.5–12.5)
Monocytes Absolute: 860 cells/uL (ref 200–950)
NEUTROS ABS: 3440 {cells}/uL (ref 1500–7800)
Neutrophils Relative %: 40 %
Platelets: 261 10*3/uL (ref 140–400)
RBC: 5.18 MIL/uL (ref 4.20–5.80)
RDW: 15 % (ref 11.0–15.0)
WBC: 8.6 10*3/uL (ref 3.8–10.8)

## 2017-01-15 LAB — COMPREHENSIVE METABOLIC PANEL
ALBUMIN: 3.7 g/dL (ref 3.6–5.1)
ALT: 17 U/L (ref 9–46)
AST: 14 U/L (ref 10–40)
Alkaline Phosphatase: 75 U/L (ref 40–115)
BUN: 15 mg/dL (ref 7–25)
CALCIUM: 8.6 mg/dL (ref 8.6–10.3)
CHLORIDE: 108 mmol/L (ref 98–110)
CO2: 22 mmol/L (ref 20–31)
Creat: 1.06 mg/dL (ref 0.60–1.35)
Glucose, Bld: 109 mg/dL — ABNORMAL HIGH (ref 70–99)
POTASSIUM: 3.9 mmol/L (ref 3.5–5.3)
Sodium: 139 mmol/L (ref 135–146)
TOTAL PROTEIN: 7.2 g/dL (ref 6.1–8.1)
Total Bilirubin: 0.7 mg/dL (ref 0.2–1.2)

## 2017-01-15 LAB — LIPID PANEL
CHOLESTEROL: 150 mg/dL (ref <200)
HDL: 28 mg/dL — ABNORMAL LOW (ref >40)
LDL Cholesterol: 89 mg/dL (ref <100)
Total CHOL/HDL Ratio: 5.4 Ratio — ABNORMAL HIGH (ref <5.0)
Triglycerides: 163 mg/dL — ABNORMAL HIGH (ref <150)
VLDL: 33 mg/dL — ABNORMAL HIGH (ref <30)

## 2017-01-15 LAB — TSH: TSH: 1.76 mIU/L (ref 0.40–4.50)

## 2017-01-15 MED ORDER — CLONAZEPAM 1 MG PO TABS: ORAL_TABLET | ORAL | 2 refills | Status: DC

## 2017-01-15 MED ORDER — AZITHROMYCIN 250 MG PO TABS: ORAL_TABLET | ORAL | 0 refills | Status: DC

## 2017-01-15 MED ORDER — ESCITALOPRAM OXALATE 20 MG PO TABS: 20.0000 mg | ORAL_TABLET | Freq: Every day | ORAL | 6 refills | Status: DC

## 2017-01-15 NOTE — Assessment & Plan Note (Signed)
Currently diet-controlled check A1c

## 2017-01-15 NOTE — Assessment & Plan Note (Signed)
BP elevated. He states he lives with his medications. I will obtain his fasting labs today will schedule appointment with his cardiologist. He appears to be compensated with regards to the heart failure. He has been intentionally trying to lose weight
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
## 2017-01-15 NOTE — Patient Instructions (Addendum)
Increase Lexparo  Take antibiotics  Call Dr. Aundra Dubin for appointment  Use mucinex DM F/U 2 months

## 2017-01-16 ENCOUNTER — Other Ambulatory Visit (HOSPITAL_COMMUNITY): Payer: Self-pay | Admitting: Cardiology

## 2017-01-16 ENCOUNTER — Ambulatory Visit (HOSPITAL_COMMUNITY)
Admission: RE | Admit: 2017-01-16 | Discharge: 2017-01-16 | Disposition: A | Discharge status: Home / Self Care | Payer: Medicare Other | Source: Ambulatory Visit | Attending: Cardiology | Admitting: Cardiology

## 2017-01-16 DIAGNOSIS — G4733 Obstructive sleep apnea (adult) (pediatric): Secondary | ICD-10-CM | POA: Diagnosis not present

## 2017-01-16 DIAGNOSIS — T783XXA Angioneurotic edema, initial encounter: Secondary | ICD-10-CM | POA: Diagnosis not present

## 2017-01-16 DIAGNOSIS — I110 Hypertensive heart disease with heart failure: Secondary | ICD-10-CM | POA: Insufficient documentation

## 2017-01-16 DIAGNOSIS — F17210 Nicotine dependence, cigarettes, uncomplicated: Secondary | ICD-10-CM | POA: Diagnosis not present

## 2017-01-16 DIAGNOSIS — Z7982 Long term (current) use of aspirin: Secondary | ICD-10-CM | POA: Diagnosis not present

## 2017-01-16 DIAGNOSIS — F419 Anxiety disorder, unspecified: Secondary | ICD-10-CM | POA: Diagnosis not present

## 2017-01-16 DIAGNOSIS — I5022 Chronic systolic (congestive) heart failure: Secondary | ICD-10-CM

## 2017-01-16 DIAGNOSIS — I480 Paroxysmal atrial fibrillation: Secondary | ICD-10-CM | POA: Insufficient documentation

## 2017-01-16 DIAGNOSIS — R51 Headache: Secondary | ICD-10-CM | POA: Insufficient documentation

## 2017-01-16 DIAGNOSIS — I429 Cardiomyopathy, unspecified: Secondary | ICD-10-CM | POA: Insufficient documentation

## 2017-01-16 LAB — HEMOGLOBIN A1C
HEMOGLOBIN A1C: 6.1 % — AB (ref <5.7)
MEAN PLASMA GLUCOSE: 128 mg/dL

## 2017-01-16 MED ORDER — LOSARTAN POTASSIUM 50 MG PO TABS: 50.0000 mg | ORAL_TABLET | Freq: Every day | ORAL | 6 refills | Status: DC

## 2017-01-16 MED ORDER — TORSEMIDE 20 MG PO TABS: 20.0000 mg | ORAL_TABLET | Freq: Every day | ORAL | 6 refills | Status: DC

## 2017-01-16 NOTE — Patient Instructions (Signed)
Take Toprol XL 100 mg (1 tab) in am and 50 mg (1/2 tab) in pm.  Take Losartan 50 mg tablet once daily at bedtime.  Take Torsemide 20 mg tablet once every morning.  Will schedule you for an echocardiogram at [facility]. Address: [address] ([place]), [place], [place] [address]  Phone: [phone]  _____________________________________________________  _____________________________________________________  Follow up with Dr. Aundra Dubin in 2 months.  Do the following things EVERYDAY: 1) Weigh yourself in the morning before breakfast. Write it down and keep it in a log. 2) Take your medicines as prescribed 3) Eat low salt foods-Limit salt (sodium) to 2000 mg per day.  4) Stay as active as you can everyday 5) Limit all fluids for the day to less than 2 liters

## 2017-01-18 NOTE — Progress Notes (Signed)
Patient ID: [name], male   DOB: [birth year], 51 y.o.   MRN: [id number]   Advanced HF Clinic Note PCP: Dr. Buelah Manis Cardiology: [name]  51 yo with history of nonischemic cardiomyopathy presents for cardiology followup.  Echo in 5/15 showed EF 30-35%.  The cardiomyopathy may be due to prior ETOH abuse, he now has quit.  Past cath showed no significant coronary disease.  He is morbidly obese.  He was on Xarelto for a PE in 5/15 for about a year and is now off. Repeat echo in 9/16 showed EF 20-25%.   He was admitted in 12/16 with palpitations => he was found to have PVCs. Initially, it was recommended that he start on amiodarone but he did not want to take it and is having only rare PVCs now.   He is still smoking rare cigarettes but not drinking ETOH.  He has seen EP, have decided against ICD in setting of nonischemic CMP and morbid obesity.    He is not fully compliant with meds.  Not taking torsemide regularly.  Says it makes him urinate too much.  Not always taking the pm dose of Toprol XL.  Using Bipap every night for OSA.  He has been under a lot of stress recently.  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  He has significant anxiety.  Lexapro was recently increased.  He gets chest pain when he gets anxious. Weight is actually down about 2 lbs compared to prior appointment here. He gets short of breath but seems more related to stress/anxiety than exertion.    Labs (5/13): K 4.1, creatinine 1.05 Labs (1/14): K 3.8, creatinine 1.16, BNP 54 Labs (2/14): K 3.7, creatinine 1.11, BNP 28 Labs (2/16): K 4.1, creatinine 1.03, LDL 96, HCT 40 Labs (3/16): K 3.7, K 1.13, BNP 367 Labs (8/16): BNP 43, K 3.5, creatinine 1.01 Labs (08/13/15): K 3.7, creatinine 1.18, HCT 41.3 Labs (12/16): K 3.7, creatinine 1.14, TGs 495, digoxin < 0.2 Labs (2/17): K 3.8, creatinine 0.99, LDL 107, TGs 222 Labs (5/17): K 4, creatinine 1.47 Labs (2/18): K 3.9, creatinine 1.06, hgb 15.1, TGs  163, LDL 89, HDL 28, TSH normal   PMH: 1. Nonischemic cardiomyopathy: Prior cath with no significant CAD.  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  Prior echoes with EF as low as 25%.  Echo (9/13) with EF 35-40%, moderate to severe LV dilation, diffuse hypokinesis, mild MR. Echo (5/15) with EF 30-35%, moderate to severe LAE, normal RV size and systolic function.  Angioedema with ACEI, headaches with hydralazine/nitrates. Echo (3/16) with EF 25-30%, severe LV dilation, normal RV size and systolic function.  [facility] 08/13/15 showed no significant coronary disease; RA mean 6, PA 33/11 mean 23, PCWP mean 13, Fick CO/CI 4.75 /1.68 (difficult study, radial artery spasm, if needs future cath would use groin).  Echo (9/16) showed EF 20-25%.   2. HTN: angioedema with ACEI.  3. OSA: on Bipap 4. Morbid obesity 5. Paroxysmal atrial fibrillation: Not documented recently.   6. Smoker.  7. Anxiety/panic attacks 8. PE: 5/15, diagnosed by V/Q scan. CTA chest 8/16 negative for PE.  9. NSVT, PVCs: 30 day monitor (12/16) with PVCs, PACs, no atrial fibrillation.  10. Hematuria: Apparently had negative workup by urology.  11. ABIs (6/16) were normal 12. 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  13. Gout 14. Low back pain.   SH: On disability, lives in  [place], prior ETOH abuse but now dose not drink, no drugs, smokes 1 cig/day.  Has son and daughter.    FH: No premature CAD.    ROS: All systems reviewed and negative except as per HPI.   Current Outpatient Prescriptions  Medication Sig Dispense Refill  . aspirin EC 81 MG tablet Take 1 tablet (81 mg total) by mouth daily. 90 tablet 3  . azithromycin (ZITHROMAX) 250 MG tablet Take 2 tablets x 1 day, then 1 tab daily for 4 days 6 tablet 0  . clonazePAM (KLONOPIN) 1 MG tablet TAKE 1 TABLET BY MOUTH 3 TIMES DAILY AS NEEDED FOR ANXIETY 90 tablet 2  . clotrimazole (LOTRIMIN) 1 % cream Apply 1 application topically 2 (two) times  daily. 30 g 0  . escitalopram (LEXAPRO) 20 MG tablet Take 1 tablet (20 mg total) by mouth daily. 30 tablet 6  . fenofibrate 54 MG tablet Take 1 tablet (54 mg total) by mouth daily. 90 tablet 3  . magnesium oxide (MAG-OX) 400 MG tablet Take 1 tablet (400 mg total) by mouth daily. (Patient taking differently: Take 400 mg by mouth daily as needed (supplement). ) 30 tablet 11  . metoprolol succinate (TOPROL-XL) 100 MG 24 hr tablet Take 100mg  (1 tablet) in the morning and 50mg  (1/2) tablet in the evening 45 tablet 6  . NON FORMULARY Bipap, pressure 16/12 with 2L of O2    . oxyCODONE-acetaminophen (ROXICET) 5-325 MG tablet Take 1 tablet by mouth every 8 (eight) hours as needed for severe pain. 20 tablet 0  . pantoprazole (PROTONIX) 40 MG tablet TAKE 1 TABLET (40 MG TOTAL) BY MOUTH DAILY. 30 tablet 6  . potassium chloride SA (K-DUR,KLOR-CON) 20 MEQ tablet Take 20 mEq by mouth daily.    . sildenafil (REVATIO) 20 MG tablet TAKE 1 TABLET (20 MG TOTAL) BY MOUTH DAILY AS NEEDED. (Patient taking differently: Take 20 mg by mouth daily as needed (ED). ) 15 tablet 3  . spironolactone (ALDACTONE) 25 MG tablet TAKE 1 TABLET (25 MG TOTAL) BY MOUTH DAILY. 30 tablet 6  . thiamine (VITAMIN B-1) 100 MG tablet Take 100 mg by mouth daily.    Marland Kitchen torsemide (DEMADEX) 20 MG tablet Take 1 tablet (20 mg total) by mouth daily. 30 tablet 6  . losartan (COZAAR) 50 MG tablet Take 1 tablet (50 mg total) by mouth at bedtime. 30 tablet 6   No current facility-administered medications for this encounter.      There were no vitals taken for this visit.   General: NAD, obese.  Neck: Thick, JVP 8 cm, no thyromegaly or thyroid nodule.  Lungs: Clear to auscultation bilaterally with normal respiratory effort. CV: Nondisplaced PMI.  Heart regular S1/S2, no S3/S4, no murmur.  Trace ankle edema.  No carotid bruit.  Normal pedal pulses.  Abdomen: Soft, nontender, unable to palpate liver edge, mild distention.  Neurologic: Alert and oriented  x 3.  Psych: Normal affect. Extremities: No clubbing or cyanosis.    Assessment/Plan: 1. Chronic systolic CHF: Nonischemic cardiomyopathy. Echo (9/16) with EF 20-25%. He was seen by EP and decided against ICD given marked obesity.  QRS not wide enough for CRT.  NYHA class II symptoms. Anxiety seems to play a major role in his symptoms.  Exam difficult for volume but suspect mild volume overload. - I would like him to take torsemide daily, will have him take 20 mg daily rather than the 30 mg that was prescribed before.  BMET in 2 wks.  -  Continue current spironolactone and continue losartan at 50 mg daily.  I will no increase given rise in creatinine when most recently checked. He had angioedema with ACEI, but the risk of angioedema in this situation with ARB use is low.  I think risk of recurrent angioedema with Delene Loll would be higher, so will not switch ARB to entresto.  - I asked him to try to take Toprol XL as ordered, 100 qam, 50 qpm.  - Continue to avoid ETOH.  - Had side effects from digoxin, does not want to take. - Headaches with Bidil, cannot take.  - Due for repeat echo, will arrange.  2. Obesity: Needs to continue to work on diet/exercise for weight loss.  3. Smoking:  Needs to quit smoking altogether.   4. OSA: Continue Bipap.  5. Paroxysmal atrial fibrillation: Remote. He is no longer anticoagulated. Event monitor in 12/16 showed no atrial fibrillation.  6. PE: 5/15, diagnosed by V/Q scan.  He was on Xarelto for about a year but stopped in the setting of hematuria.  CTA chest in 8/16 did not show a PE.  Lower extremity venous US in 4/17 when his legs were swelling did not show a DVT.   Followup in 2 months.   [name] 01/18/2017

## 2017-01-26 ENCOUNTER — Other Ambulatory Visit (HOSPITAL_COMMUNITY): Payer: Self-pay | Admitting: Student

## 2017-02-06 ENCOUNTER — Other Ambulatory Visit (HOSPITAL_COMMUNITY): Payer: Self-pay | Admitting: Cardiology

## 2017-02-11 ENCOUNTER — Ambulatory Visit (HOSPITAL_COMMUNITY): Payer: Medicare Other

## 2017-02-25 ENCOUNTER — Ambulatory Visit (HOSPITAL_COMMUNITY): Payer: Medicare Other | Attending: Cardiovascular Disease

## 2017-02-25 ENCOUNTER — Other Ambulatory Visit: Payer: Self-pay

## 2017-02-25 DIAGNOSIS — E119 Type 2 diabetes mellitus without complications: Secondary | ICD-10-CM | POA: Diagnosis not present

## 2017-02-25 DIAGNOSIS — Z720 Tobacco use: Secondary | ICD-10-CM | POA: Insufficient documentation

## 2017-02-25 DIAGNOSIS — E669 Obesity, unspecified: Secondary | ICD-10-CM | POA: Diagnosis not present

## 2017-02-25 DIAGNOSIS — I071 Rheumatic tricuspid insufficiency: Secondary | ICD-10-CM | POA: Insufficient documentation

## 2017-02-25 DIAGNOSIS — I5022 Chronic systolic (congestive) heart failure: Secondary | ICD-10-CM | POA: Diagnosis not present

## 2017-02-25 DIAGNOSIS — Z8249 Family history of ischemic heart disease and other diseases of the circulatory system: Secondary | ICD-10-CM | POA: Insufficient documentation

## 2017-02-25 DIAGNOSIS — G4733 Obstructive sleep apnea (adult) (pediatric): Secondary | ICD-10-CM | POA: Insufficient documentation

## 2017-02-25 DIAGNOSIS — I371 Nonrheumatic pulmonary valve insufficiency: Secondary | ICD-10-CM | POA: Diagnosis not present

## 2017-02-25 DIAGNOSIS — I5043 Acute on chronic combined systolic (congestive) and diastolic (congestive) heart failure: Secondary | ICD-10-CM

## 2017-02-25 DIAGNOSIS — I4891 Unspecified atrial fibrillation: Secondary | ICD-10-CM | POA: Insufficient documentation

## 2017-02-25 MED ORDER — PERFLUTREN LIPID MICROSPHERE
1.0000 mL | INTRAVENOUS | Status: AC | PRN
  Administered 2017-02-25: 1.5 mL via INTRAVENOUS

## 2017-03-16 ENCOUNTER — Ambulatory Visit: Payer: Medicare Other | Admitting: Family Medicine

## 2017-03-18 ENCOUNTER — Ambulatory Visit (HOSPITAL_COMMUNITY)
Admission: RE | Admit: 2017-03-18 | Discharge: 2017-03-18 | Disposition: A | Discharge status: Home / Self Care | Payer: Medicare Other | Source: Ambulatory Visit | Attending: Cardiology | Admitting: Cardiology

## 2017-03-18 ENCOUNTER — Encounter (HOSPITAL_COMMUNITY): Payer: Self-pay

## 2017-03-18 VITALS — BP 130/82 | HR 74 | Wt >= 6400 oz

## 2017-03-18 DIAGNOSIS — Z6841 Body Mass Index (BMI) 40.0 and over, adult: Secondary | ICD-10-CM | POA: Diagnosis not present

## 2017-03-18 DIAGNOSIS — Z7982 Long term (current) use of aspirin: Secondary | ICD-10-CM | POA: Diagnosis not present

## 2017-03-18 DIAGNOSIS — F17200 Nicotine dependence, unspecified, uncomplicated: Secondary | ICD-10-CM | POA: Diagnosis not present

## 2017-03-18 DIAGNOSIS — G4733 Obstructive sleep apnea (adult) (pediatric): Secondary | ICD-10-CM | POA: Diagnosis not present

## 2017-03-18 DIAGNOSIS — F419 Anxiety disorder, unspecified: Secondary | ICD-10-CM | POA: Insufficient documentation

## 2017-03-18 DIAGNOSIS — I429 Cardiomyopathy, unspecified: Secondary | ICD-10-CM | POA: Insufficient documentation

## 2017-03-18 DIAGNOSIS — I480 Paroxysmal atrial fibrillation: Secondary | ICD-10-CM | POA: Insufficient documentation

## 2017-03-18 DIAGNOSIS — I5022 Chronic systolic (congestive) heart failure: Secondary | ICD-10-CM | POA: Diagnosis not present

## 2017-03-18 DIAGNOSIS — R5383 Other fatigue: Secondary | ICD-10-CM | POA: Insufficient documentation

## 2017-03-18 DIAGNOSIS — I428 Other cardiomyopathies: Secondary | ICD-10-CM

## 2017-03-18 DIAGNOSIS — I110 Hypertensive heart disease with heart failure: Secondary | ICD-10-CM | POA: Insufficient documentation

## 2017-03-18 DIAGNOSIS — T783XXA Angioneurotic edema, initial encounter: Secondary | ICD-10-CM | POA: Diagnosis not present

## 2017-03-18 DIAGNOSIS — R51 Headache: Secondary | ICD-10-CM | POA: Insufficient documentation

## 2017-03-18 LAB — CBC
HEMATOCRIT: 43.5 % (ref 39.0–52.0)
HEMOGLOBIN: 15.4 g/dL (ref 13.0–17.0)
MCH: 29.3 pg (ref 26.0–34.0)
MCHC: 35.4 g/dL (ref 30.0–36.0)
MCV: 82.7 fL (ref 78.0–100.0)
Platelets: 234 10*3/uL (ref 150–400)
RBC: 5.26 MIL/uL (ref 4.22–5.81)
RDW: 14.7 % (ref 11.5–15.5)
WBC: 8.3 10*3/uL (ref 4.0–10.5)

## 2017-03-18 LAB — TSH: TSH: 2.542 u[IU]/mL (ref 0.350–4.500)

## 2017-03-18 LAB — BRAIN NATRIURETIC PEPTIDE: B Natriuretic Peptide: 52.6 pg/mL (ref 0.0–100.0)

## 2017-03-18 LAB — BASIC METABOLIC PANEL
Anion gap: 9 (ref 5–15)
BUN: 14 mg/dL (ref 6–20)
CALCIUM: 8.8 mg/dL — AB (ref 8.9–10.3)
CHLORIDE: 105 mmol/L (ref 101–111)
CO2: 26 mmol/L (ref 22–32)
CREATININE: 1.11 mg/dL (ref 0.61–1.24)
GFR calc Af Amer: >60 mL/min (ref >60)
GFR calc non Af Amer: >60 mL/min (ref >60)
GLUCOSE: 118 mg/dL — AB (ref 65–99)
Potassium: 3.5 mmol/L (ref 3.5–5.1)
Sodium: 140 mmol/L (ref 135–145)

## 2017-03-18 MED ORDER — METOPROLOL SUCCINATE ER 100 MG PO TB24
100.0000 mg | ORAL_TABLET | Freq: Two times a day (BID) | ORAL | 6 refills | Status: DC
Start: 2017-03-18 — End: 2017-04-15

## 2017-03-18 NOTE — Patient Instructions (Signed)
Routine lab work today. Will notify you of abnormal results, otherwise no news is good news!  INCREASE Toprol XL to 100 mg (1 tablet) twice daily.  Will refer you to [facility] at [facility] for new provider appointment. [address], [place] Phone: [phone]  _________________________________________________  _________________________________________________   Follow up with Dr. Aundra Dubin in 3 months.  Do the following things EVERYDAY: 1) Weigh yourself in the morning before breakfast. Write it down and keep it in a log. 2) Take your medicines as prescribed 3) Eat low salt foods-Limit salt (sodium) to 2000 mg per day.  4) Stay as active as you can everyday 5) Limit all fluids for the day to less than 2 liters

## 2017-03-19 NOTE — Progress Notes (Signed)
Patient ID: [name], male   DOB: [birth year], 51 y.o.   MRN: [id number]   Advanced HF Clinic Note PCP: Dr. Buelah Manis Cardiology: [name]  51 yo with history of nonischemic cardiomyopathy presents for cardiology followup.  Echo in 5/15 showed EF 30-35%.  The cardiomyopathy may be due to prior ETOH abuse, he now has quit.  Past cath showed no significant coronary disease.  He is morbidly obese.  He was on Xarelto for a PE in 5/15 for about a year and is now off. Repeat echo in 9/16 showed EF 20-25%.   He was admitted in 12/16 with palpitations => he was found to have PVCs. Initially, it was recommended that he start on amiodarone but he did not want to take it and is having only rare PVCs now.   He is still smoking rare cigarettes and unfortunately has started drinking more.  He has seen EP, have decided against ICD in setting of nonischemic CMP and morbid obesity.   Echo (3/18) with EF 25-30%.   He is now taking all his meds.  Using Bipap every night for OSA.  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  He has significant anxiety, this seems to be driving his increased ETOH intake.  No chest pain.  Weight is actually down about 4 lbs compared to prior appointment here. Dyspnea with stairs, ok on flat ground.   Labs (5/13): K 4.1, creatinine 1.05 Labs (1/14): K 3.8, creatinine 1.16, BNP 54 Labs (2/14): K 3.7, creatinine 1.11, BNP 28 Labs (2/16): K 4.1, creatinine 1.03, LDL 96, HCT 40 Labs (3/16): K 3.7, K 1.13, BNP 367 Labs (8/16): BNP 43, K 3.5, creatinine 1.01 Labs (08/13/15): K 3.7, creatinine 1.18, HCT 41.3 Labs (12/16): K 3.7, creatinine 1.14, TGs 495, digoxin < 0.2 Labs (2/17): K 3.8, creatinine 0.99, LDL 107, TGs 222 Labs (5/17): K 4, creatinine 1.47 Labs (2/18): K 3.9, creatinine 1.06, hgb 15.1, TGs 163, LDL 89, HDL 28, TSH normal   PMH: 1. Nonischemic cardiomyopathy: Prior cath with no significant CAD.  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  Prior echoes with EF as low as 25%.  Echo (9/13) with EF 35-40%, moderate to severe LV dilation, diffuse hypokinesis, mild MR. Echo (5/15) with EF 30-35%, moderate to severe LAE, normal RV size and systolic function.  Angioedema with ACEI, headaches with hydralazine/nitrates. Echo (3/16) with EF 25-30%, severe LV dilation, normal RV size and systolic function.  [facility] 08/13/15 showed no significant coronary disease; RA mean 6, PA 33/11 mean 23, PCWP mean 13, Fick CO/CI 4.75 /1.68 (difficult study, radial artery spasm, if needs future cath would use groin).  Echo (9/16) showed EF 20-25%.   - Echo (3/18): EF 25-30%, moderate LAE 2. HTN: angioedema with ACEI.  3. OSA: on Bipap 4. Morbid obesity 5. Paroxysmal atrial fibrillation: Not documented recently.   6. Smoker.  7. Anxiety/panic attacks 8. PE: 5/15, diagnosed by V/Q scan. CTA chest 8/16 negative for PE.  9. NSVT, PVCs: 30 day monitor (12/16) with PVCs, PACs, no atrial fibrillation.  10. Hematuria: Apparently had negative workup by urology.  11. ABIs (6/16) were normal 12. 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  13. Gout 14. Low back pain.   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  Has son and daughter.    FH: No premature CAD.  ROS: All systems reviewed and negative except as per HPI.   Current Outpatient Prescriptions  Medication Sig Dispense Refill  . aspirin EC 81 MG tablet Take 1 tablet (81 mg total) by mouth daily. 90 tablet 3  . clonazePAM (KLONOPIN) 1 MG tablet TAKE 1 TABLET BY MOUTH 3 TIMES DAILY AS NEEDED FOR ANXIETY 90 tablet 2  . clotrimazole (LOTRIMIN) 1 % cream Apply 1 application topically 2 (two) times daily. 30 g 0  . escitalopram (LEXAPRO) 20 MG tablet Take 1 tablet (20 mg total) by mouth daily. 30 tablet 6  . fenofibrate 54 MG tablet TAKE 1 TABLET (54 MG TOTAL) BY MOUTH DAILY. 90 tablet 3  . losartan (COZAAR) 50 MG tablet Take 1  tablet (50 mg total) by mouth at bedtime. 30 tablet 6  . magnesium oxide (MAG-OX) 400 MG tablet Take 1 tablet (400 mg total) by mouth daily. (Patient taking differently: Take 400 mg by mouth daily as needed (supplement). ) 30 tablet 11  . metoprolol succinate (TOPROL-XL) 100 MG 24 hr tablet Take 1 tablet (100 mg total) by mouth 2 (two) times daily. 60 tablet 6  . NON FORMULARY Bipap, pressure 16/12 with 2L of O2    . oxyCODONE-acetaminophen (ROXICET) 5-325 MG tablet Take 1 tablet by mouth every 8 (eight) hours as needed for severe pain. 20 tablet 0  . pantoprazole (PROTONIX) 40 MG tablet TAKE 1 TABLET (40 MG TOTAL) BY MOUTH DAILY. 30 tablet 6  . potassium chloride SA (K-DUR,KLOR-CON) 20 MEQ tablet Take 20 mEq by mouth daily.    . sildenafil (REVATIO) 20 MG tablet TAKE 1 TABLET (20 MG TOTAL) BY MOUTH DAILY AS NEEDED. (Patient taking differently: Take 20 mg by mouth daily as needed (ED). ) 15 tablet 3  . spironolactone (ALDACTONE) 25 MG tablet TAKE 1 TABLET (25 MG TOTAL) BY MOUTH DAILY. 30 tablet 6  . thiamine (VITAMIN B-1) 100 MG tablet Take 100 mg by mouth daily.    Marland Kitchen torsemide (DEMADEX) 20 MG tablet Take 1 tablet (20 mg total) by mouth daily. 30 tablet 6  . torsemide (DEMADEX) 20 MG tablet Take 1 tablet (20 mg total) by mouth daily. 90 tablet 3   No current facility-administered medications for this encounter.      BP 130/82 (BP Location: Right Wrist, Patient Position: Sitting, Cuff Size: Normal)   Pulse 74   Wt (!) 411 lb 8 oz (186.7 kg)   SpO2 98%   BMI 61.66 kg/m    General: NAD, obese.  Neck: Thick, JVP 7 cm, no thyromegaly or thyroid nodule.  Lungs: Clear to auscultation bilaterally with normal respiratory effort. CV: Nondisplaced PMI.  Heart regular S1/S2, no S3/S4, no murmur.  Trace ankle edema with lower leg varicosities.  No carotid bruit.  Normal pedal pulses.  Abdomen: Soft, nontender, no HSM, nondistended.  Neurologic: Alert and oriented x 3.  Psych: Normal  affect. Extremities: No clubbing or cyanosis.    Assessment/Plan: 1. Chronic systolic CHF: Nonischemic cardiomyopathy. Echo (3/18) with EF 25-30%. He was seen by EP and decided against ICD given marked obesity.  QRS not wide enough for CRT.  NYHA class II symptoms. Anxiety seems to play a major role in his symptoms.  Exam difficult for volume but suspect volume status is ok.   - Needs to cut back on ETOH.  - Continue torsemide 20 daily.  BMET/BNP today. - Continue current spironolactone and continue losartan at 50 mg daily. He had angioedema with ACEI, but the risk of  angioedema in this situation with ARB use is low.  Risk of recurrent angioedema with Delene Loll would be higher, so will not switch ARB to entresto.  - Increase Toprol XL to 100 mg bid. - Had side effects from digoxin, does not want to take. - Headaches with Bidil, cannot take.  2. Obesity: Needs to continue to work on diet/exercise for weight loss.  3. Smoking:  Needs to quit smoking altogether.   4. OSA: Continue Bipap.  5. Paroxysmal atrial fibrillation: Remote. He is no longer anticoagulated. Event monitor in 12/16 showed no atrial fibrillation.  6. PE: 5/15, diagnosed by V/Q scan.  He was on Xarelto for about a year but stopped in the setting of hematuria.  CTA chest in 8/16 did not show a PE.  Lower extremity venous US in 4/17 when his legs were swelling did not show a DVT.  7. Fatigue: Think this is due to lack of sleep and stress.  Will check CBC and TSH.   Followup in 3 months.   [name] 03/19/2017

## 2017-03-25 ENCOUNTER — Encounter: Payer: Self-pay | Admitting: Family Medicine

## 2017-04-02 ENCOUNTER — Other Ambulatory Visit: Payer: Self-pay | Admitting: Family Medicine

## 2017-04-12 ENCOUNTER — Inpatient Hospital Stay (HOSPITAL_COMMUNITY)
Admission: EM | Admit: 2017-04-12 | Discharge: 2017-04-15 | DRG: 175 | Disposition: A | Discharge status: Home / Self Care | Payer: Medicare Other | Attending: Family Medicine | Admitting: Family Medicine

## 2017-04-12 ENCOUNTER — Encounter (HOSPITAL_COMMUNITY): Payer: Self-pay | Admitting: *Deleted

## 2017-04-12 ENCOUNTER — Emergency Department (HOSPITAL_COMMUNITY): Payer: Medicare Other

## 2017-04-12 DIAGNOSIS — K219 Gastro-esophageal reflux disease without esophagitis: Secondary | ICD-10-CM | POA: Diagnosis not present

## 2017-04-12 DIAGNOSIS — I428 Other cardiomyopathies: Secondary | ICD-10-CM | POA: Diagnosis present

## 2017-04-12 DIAGNOSIS — F17210 Nicotine dependence, cigarettes, uncomplicated: Secondary | ICD-10-CM | POA: Diagnosis present

## 2017-04-12 DIAGNOSIS — I110 Hypertensive heart disease with heart failure: Secondary | ICD-10-CM | POA: Diagnosis present

## 2017-04-12 DIAGNOSIS — Z6841 Body Mass Index (BMI) 40.0 and over, adult: Secondary | ICD-10-CM

## 2017-04-12 DIAGNOSIS — I2699 Other pulmonary embolism without acute cor pulmonale: Secondary | ICD-10-CM | POA: Diagnosis not present

## 2017-04-12 DIAGNOSIS — R7989 Other specified abnormal findings of blood chemistry: Secondary | ICD-10-CM | POA: Diagnosis not present

## 2017-04-12 DIAGNOSIS — Z720 Tobacco use: Secondary | ICD-10-CM | POA: Diagnosis present

## 2017-04-12 DIAGNOSIS — E785 Hyperlipidemia, unspecified: Secondary | ICD-10-CM | POA: Diagnosis present

## 2017-04-12 DIAGNOSIS — R0789 Other chest pain: Secondary | ICD-10-CM | POA: Diagnosis not present

## 2017-04-12 DIAGNOSIS — F329 Major depressive disorder, single episode, unspecified: Secondary | ICD-10-CM | POA: Diagnosis present

## 2017-04-12 DIAGNOSIS — F1010 Alcohol abuse, uncomplicated: Secondary | ICD-10-CM | POA: Diagnosis present

## 2017-04-12 DIAGNOSIS — R600 Localized edema: Secondary | ICD-10-CM | POA: Diagnosis not present

## 2017-04-12 DIAGNOSIS — N179 Acute kidney failure, unspecified: Secondary | ICD-10-CM | POA: Diagnosis present

## 2017-04-12 DIAGNOSIS — R0602 Shortness of breath: Secondary | ICD-10-CM | POA: Diagnosis not present

## 2017-04-12 DIAGNOSIS — E119 Type 2 diabetes mellitus without complications: Secondary | ICD-10-CM | POA: Diagnosis present

## 2017-04-12 DIAGNOSIS — M170 Bilateral primary osteoarthritis of knee: Secondary | ICD-10-CM | POA: Diagnosis present

## 2017-04-12 DIAGNOSIS — Z888 Allergy status to other drugs, medicaments and biological substances status: Secondary | ICD-10-CM

## 2017-04-12 DIAGNOSIS — I10 Essential (primary) hypertension: Secondary | ICD-10-CM | POA: Diagnosis present

## 2017-04-12 DIAGNOSIS — F410 Panic disorder [episodic paroxysmal anxiety] without agoraphobia: Secondary | ICD-10-CM | POA: Diagnosis present

## 2017-04-12 DIAGNOSIS — F411 Generalized anxiety disorder: Secondary | ICD-10-CM | POA: Diagnosis present

## 2017-04-12 DIAGNOSIS — Z79899 Other long term (current) drug therapy: Secondary | ICD-10-CM

## 2017-04-12 DIAGNOSIS — R778 Other specified abnormalities of plasma proteins: Secondary | ICD-10-CM

## 2017-04-12 DIAGNOSIS — R079 Chest pain, unspecified: Secondary | ICD-10-CM | POA: Diagnosis not present

## 2017-04-12 DIAGNOSIS — Z7901 Long term (current) use of anticoagulants: Secondary | ICD-10-CM

## 2017-04-12 DIAGNOSIS — I214 Non-ST elevation (NSTEMI) myocardial infarction: Secondary | ICD-10-CM

## 2017-04-12 DIAGNOSIS — Z9114 Patient's other noncompliance with medication regimen: Secondary | ICD-10-CM

## 2017-04-12 DIAGNOSIS — Z7982 Long term (current) use of aspirin: Secondary | ICD-10-CM

## 2017-04-12 DIAGNOSIS — E118 Type 2 diabetes mellitus with unspecified complications: Secondary | ICD-10-CM

## 2017-04-12 DIAGNOSIS — I5043 Acute on chronic combined systolic (congestive) and diastolic (congestive) heart failure: Secondary | ICD-10-CM | POA: Diagnosis present

## 2017-04-12 DIAGNOSIS — Z9111 Patient's noncompliance with dietary regimen: Secondary | ICD-10-CM

## 2017-04-12 DIAGNOSIS — Z86718 Personal history of other venous thrombosis and embolism: Secondary | ICD-10-CM

## 2017-04-12 DIAGNOSIS — I5022 Chronic systolic (congestive) heart failure: Secondary | ICD-10-CM | POA: Diagnosis present

## 2017-04-12 DIAGNOSIS — I480 Paroxysmal atrial fibrillation: Secondary | ICD-10-CM | POA: Diagnosis present

## 2017-04-12 DIAGNOSIS — G4733 Obstructive sleep apnea (adult) (pediatric): Secondary | ICD-10-CM | POA: Diagnosis present

## 2017-04-12 DIAGNOSIS — I447 Left bundle-branch block, unspecified: Secondary | ICD-10-CM | POA: Diagnosis present

## 2017-04-12 DIAGNOSIS — I313 Pericardial effusion (noninflammatory): Secondary | ICD-10-CM | POA: Diagnosis present

## 2017-04-12 DIAGNOSIS — M542 Cervicalgia: Secondary | ICD-10-CM | POA: Diagnosis present

## 2017-04-12 LAB — BRAIN NATRIURETIC PEPTIDE: B Natriuretic Peptide: 158.1 pg/mL — ABNORMAL HIGH (ref 0.0–100.0)

## 2017-04-12 LAB — BASIC METABOLIC PANEL
ANION GAP: 8 (ref 5–15)
BUN: 17 mg/dL (ref 6–20)
CALCIUM: 9.1 mg/dL (ref 8.9–10.3)
CHLORIDE: 104 mmol/L (ref 101–111)
CO2: 26 mmol/L (ref 22–32)
Creatinine, Ser: 1.34 mg/dL — ABNORMAL HIGH (ref 0.61–1.24)
GFR calc Af Amer: >60 mL/min (ref >60)
GFR calc non Af Amer: >60 mL/min (ref >60)
Glucose, Bld: 108 mg/dL — ABNORMAL HIGH (ref 65–99)
Potassium: 3.6 mmol/L (ref 3.5–5.1)
Sodium: 138 mmol/L (ref 135–145)

## 2017-04-12 LAB — CBC
HEMATOCRIT: 42.5 % (ref 39.0–52.0)
HEMOGLOBIN: 15.1 g/dL (ref 13.0–17.0)
MCH: 29.6 pg (ref 26.0–34.0)
MCHC: 35.5 g/dL (ref 30.0–36.0)
MCV: 83.3 fL (ref 78.0–100.0)
PLATELETS: 220 10*3/uL (ref 150–400)
RBC: 5.1 MIL/uL (ref 4.22–5.81)
RDW: 14.9 % (ref 11.5–15.5)
WBC: 11.5 10*3/uL — ABNORMAL HIGH (ref 4.0–10.5)

## 2017-04-12 LAB — I-STAT TROPONIN, ED: Troponin i, poc: 0.05 ng/mL (ref 0.00–0.08)

## 2017-04-12 LAB — RAPID URINE DRUG SCREEN, HOSP PERFORMED
AMPHETAMINES: NOT DETECTED
BARBITURATES: NOT DETECTED
Benzodiazepines: NOT DETECTED
Cocaine: NOT DETECTED
Opiates: POSITIVE — AB
Tetrahydrocannabinol: NOT DETECTED

## 2017-04-12 LAB — TROPONIN I
Troponin I: 0.05 ng/mL (ref <0.03)
Troponin I: 0.29 ng/mL (ref <0.03)

## 2017-04-12 LAB — D-DIMER, QUANTITATIVE: D-Dimer, Quant: 1.16 ug/mL-FEU — ABNORMAL HIGH (ref 0.00–0.50)

## 2017-04-12 LAB — HEPARIN LEVEL (UNFRACTIONATED): Heparin Unfractionated: 0.26 IU/mL — ABNORMAL LOW (ref 0.30–0.70)

## 2017-04-12 MED ORDER — ONDANSETRON HCL 4 MG/2ML IJ SOLN
4.0000 mg | Freq: Once | INTRAMUSCULAR | Status: AC
  Administered 2017-04-12: 4 mg via INTRAVENOUS
  Filled 2017-04-12: qty 2

## 2017-04-12 MED ORDER — ACETAMINOPHEN 325 MG PO TABS
650.0000 mg | ORAL_TABLET | Freq: Four times a day (QID) | ORAL | Status: DC | PRN
  Administered 2017-04-12: 650 mg via ORAL
  Filled 2017-04-12: qty 2

## 2017-04-12 MED ORDER — ESCITALOPRAM OXALATE 10 MG PO TABS
20.0000 mg | ORAL_TABLET | Freq: Every day | ORAL | Status: DC
  Administered 2017-04-12 – 2017-04-15 (×4): 20 mg via ORAL
  Filled 2017-04-12 (×4): qty 2

## 2017-04-12 MED ORDER — HYDROMORPHONE HCL 1 MG/ML IJ SOLN
1.0000 mg | Freq: Once | INTRAMUSCULAR | Status: AC
  Administered 2017-04-12: 1 mg via INTRAVENOUS
  Filled 2017-04-12: qty 1

## 2017-04-12 MED ORDER — HEPARIN (PORCINE) IN NACL 100-0.45 UNIT/ML-% IJ SOLN
2550.0000 [IU]/h | INTRAMUSCULAR | Status: DC
  Administered 2017-04-12: 1950 [IU]/h via INTRAVENOUS
  Administered 2017-04-12: 2150 [IU]/h via INTRAVENOUS
  Administered 2017-04-13: 2400 [IU]/h via INTRAVENOUS
  Administered 2017-04-13: 2250 [IU]/h via INTRAVENOUS
  Filled 2017-04-12 (×4): qty 250

## 2017-04-12 MED ORDER — IOPAMIDOL (ISOVUE-370) INJECTION 76%
INTRAVENOUS | Status: AC
  Administered 2017-04-12: 100 mL
  Filled 2017-04-12: qty 100

## 2017-04-12 MED ORDER — FOLIC ACID 1 MG PO TABS
1.0000 mg | ORAL_TABLET | Freq: Every day | ORAL | Status: DC
  Administered 2017-04-12 – 2017-04-15 (×4): 1 mg via ORAL
  Filled 2017-04-12 (×4): qty 1

## 2017-04-12 MED ORDER — SODIUM CHLORIDE 0.9% FLUSH
3.0000 mL | Freq: Two times a day (BID) | INTRAVENOUS | Status: DC
  Administered 2017-04-12 – 2017-04-15 (×5): 3 mL via INTRAVENOUS

## 2017-04-12 MED ORDER — SPIRONOLACTONE 25 MG PO TABS
25.0000 mg | ORAL_TABLET | Freq: Every day | ORAL | Status: DC
  Administered 2017-04-12 – 2017-04-15 (×3): 25 mg via ORAL
  Filled 2017-04-12 (×4): qty 1

## 2017-04-12 MED ORDER — GI COCKTAIL ~~LOC~~
30.0000 mL | Freq: Once | ORAL | Status: AC
  Administered 2017-04-12: 30 mL via ORAL
  Filled 2017-04-12: qty 30

## 2017-04-12 MED ORDER — LORAZEPAM 2 MG/ML IJ SOLN: 1.0000 mg | Freq: Four times a day (QID) | INTRAMUSCULAR | Status: AC | PRN

## 2017-04-12 MED ORDER — PANTOPRAZOLE SODIUM 40 MG PO TBEC
40.0000 mg | DELAYED_RELEASE_TABLET | Freq: Every day | ORAL | Status: DC
  Administered 2017-04-12 – 2017-04-15 (×4): 40 mg via ORAL
  Filled 2017-04-12 (×4): qty 1

## 2017-04-12 MED ORDER — VITAMIN B-1 100 MG PO TABS
100.0000 mg | ORAL_TABLET | Freq: Every day | ORAL | Status: DC
  Administered 2017-04-12 – 2017-04-15 (×3): 100 mg via ORAL
  Filled 2017-04-12 (×4): qty 1

## 2017-04-12 MED ORDER — HEPARIN BOLUS VIA INFUSION
6000.0000 [IU] | Freq: Once | INTRAVENOUS | Status: AC
  Administered 2017-04-12: 6000 [IU] via INTRAVENOUS
  Filled 2017-04-12: qty 6000

## 2017-04-12 MED ORDER — ADULT MULTIVITAMIN W/MINERALS CH
1.0000 | ORAL_TABLET | Freq: Every day | ORAL | Status: DC
  Administered 2017-04-12 – 2017-04-15 (×4): 1 via ORAL
  Filled 2017-04-12 (×4): qty 1

## 2017-04-12 MED ORDER — NICOTINE 14 MG/24HR TD PT24
14.0000 mg | MEDICATED_PATCH | Freq: Every day | TRANSDERMAL | Status: DC
  Administered 2017-04-12 – 2017-04-15 (×4): 14 mg via TRANSDERMAL
  Filled 2017-04-12 (×4): qty 1

## 2017-04-12 MED ORDER — LOSARTAN POTASSIUM 50 MG PO TABS
50.0000 mg | ORAL_TABLET | Freq: Every day | ORAL | Status: DC
  Administered 2017-04-12 – 2017-04-13 (×2): 50 mg via ORAL
  Filled 2017-04-12 (×3): qty 1

## 2017-04-12 MED ORDER — ASPIRIN EC 81 MG PO TBEC
81.0000 mg | DELAYED_RELEASE_TABLET | Freq: Every day | ORAL | Status: DC
  Administered 2017-04-12 – 2017-04-15 (×4): 81 mg via ORAL
  Filled 2017-04-12 (×4): qty 1

## 2017-04-12 MED ORDER — MORPHINE SULFATE (PF) 4 MG/ML IV SOLN
4.0000 mg | Freq: Once | INTRAVENOUS | Status: AC
Start: 2017-04-12 — End: 2017-04-12
  Administered 2017-04-12: 4 mg via INTRAVENOUS
  Filled 2017-04-12: qty 1

## 2017-04-12 MED ORDER — HEPARIN SODIUM (PORCINE) 5000 UNIT/ML IJ SOLN: 60.0000 [IU]/kg | Freq: Once | INTRAMUSCULAR | Status: DC

## 2017-04-12 MED ORDER — HEPARIN BOLUS VIA INFUSION
4000.0000 [IU] | Freq: Once | INTRAVENOUS | Status: AC
  Administered 2017-04-12: 4000 [IU] via INTRAVENOUS
  Filled 2017-04-12: qty 4000

## 2017-04-12 MED ORDER — LORAZEPAM 1 MG PO TABS
1.0000 mg | ORAL_TABLET | Freq: Four times a day (QID) | ORAL | Status: AC | PRN
Start: 2017-04-12 — End: 2017-04-15

## 2017-04-12 MED ORDER — CLONAZEPAM 0.5 MG PO TABS
1.0000 mg | ORAL_TABLET | Freq: Three times a day (TID) | ORAL | Status: DC | PRN
  Administered 2017-04-13 – 2017-04-14 (×5): 1 mg via ORAL
  Filled 2017-04-12 (×5): qty 2

## 2017-04-12 MED ORDER — THIAMINE HCL 100 MG/ML IJ SOLN
100.0000 mg | Freq: Every day | INTRAMUSCULAR | Status: DC
  Administered 2017-04-14: 100 mg via INTRAVENOUS
  Filled 2017-04-12 (×2): qty 2

## 2017-04-12 MED ORDER — TORSEMIDE 20 MG PO TABS
20.0000 mg | ORAL_TABLET | Freq: Every day | ORAL | Status: DC
  Administered 2017-04-12 – 2017-04-15 (×3): 20 mg via ORAL
  Filled 2017-04-12 (×4): qty 1

## 2017-04-12 MED ORDER — METOPROLOL SUCCINATE ER 100 MG PO TB24
100.0000 mg | ORAL_TABLET | Freq: Two times a day (BID) | ORAL | Status: DC
  Administered 2017-04-12 – 2017-04-13 (×3): 100 mg via ORAL
  Filled 2017-04-12 (×6): qty 1

## 2017-04-12 NOTE — Progress Notes (Signed)
[facility] for Heparin Indication: pulmonary embolus  Allergies  Allergen Reactions  . Ace Inhibitors Anaphylaxis and Swelling  . Buspirone Other (See Comments)    dizziness    Patient Measurements: Height: 5\' 9"  (175.3 cm) Weight: (!) 410 lb 11.2 oz (186.3 kg) IBW/kg (Calculated) : 70.7  Ideal body weight: 69.6 kg Heparin Dosing Weight: ~115 kg  Vital Signs: BP: 128/81 (05/13 1257) Pulse Rate: 81 (05/13 1257)  Labs:  Recent Labs  04/12/17 0553 04/12/17 1322 04/12/17 1813  HGB 15.1  --   --   HCT 42.5  --   --   PLT 220  --   --   HEPARINUNFRC  --   --  0.26*  CREATININE 1.34*  --   --   TROPONINI  --  0.05*  --     Estimated Creatinine Clearance: 111.5 mL/min (A) (by C-G formula based on SCr of 1.34 mg/dL (H)).  Assessment: 51 year old male with history of PE and now probable new PE in setting of elevated D-dimer and inconclusive CT Angio (unable to rule out segmental PE), awaiting dopplers.  States has not been on anticoagulants recently but did receive Xarelto with prior PE a couple of years ago.  Started on IV heparin- initial level slightly low at 0.26 units/mL. No bleeding noted.   Goal of Therapy:  Heparin level 0.3-0.7 units/ml Monitor platelets by anticoagulation protocol: Yes   Plan:  -Rebolus with Heparin 4000 unit IV x1 (~35units/kg), then increase infusion to 2150 units/hr (~3unit/kh/hr increase) -Heparin level in 6 hours -Daily heparin level and CBC while on therapy  Marrio Scribner D. Vannary Greening, PharmD, BCPS Clinical Pharmacist Pager: [phone] 04/12/2017 7:09 PM

## 2017-04-12 NOTE — ED Notes (Signed)
Spoke to floor and informed them the need for bariatric bed

## 2017-04-12 NOTE — Progress Notes (Signed)
CRITICAL VALUE ALERT  Critical value received:  Troponin 0.05  Date of notification:  04/12/17   Time of notification:  9532  Critical value read back:yes  Nurse who received alert:  Glean Hess  MD notified (1st page):  Family Medicine  Time of first page:  65  MD notified (2nd page):  Time of second page:  Responding MD:    Time MD responded:

## 2017-04-12 NOTE — ED Provider Notes (Signed)
[facility] DEPT Provider Note   CSN: [id number] Arrival date & time: 04/12/17  0539     History   Chief Complaint Chief Complaint  Patient presents with  . Chest Pain  . Shortness of Breath    HPI [name] is a 51 y.o. male.  HPI  51 y.o. male with a hx of CHF, Paroxysmal A Fib, Obesity, presents to the Emergency Department today complaining of increasing shortness of breath and chest pain x 3 hours ago. Pt states that he was standing outside of his club when he felt a pressure sensation mid sternum that radiates into back. Notes ongoing pressure without relief from his Demadex 30mg , which he was told to take if he felt that he was fluid overloaded. Normally is 20mg  QD. Pt states that he never felt a pain like this before. Has worsening symptoms lying flat on his back. Notes no N/V. No diaphoresis. No fevers. No cough or URI symptoms. Took BP medications this AM thinking it would help symptoms, but it hasn't. Noted hx of PE last year, but has been off of anticoagulation for a while now. No other symptoms noted.   Cardiology- Dr. Aundra Dubin  Echocardiogram 02-25-17 LV EF: 25-30%  Cath 08-13-15 1. No angiographic coronary disease.  2. Normal filling pressures.  3. Low cardiac output by Fick, may not be accurate with body size and fluctuating arterial saturation.  4. He should not have cath by radial approach in the future given severe radial vasospasm.   Past Medical History:  Diagnosis Date  . Alcohol abuse   . Anxiety state, unspecified   . Atrial fibrillation ([place])   . CHF (congestive heart failure) ([place])   . Chronic systolic heart failure ([place])   . Edema   . History of medication noncompliance   . Migraine   . Obesity, unspecified   . Obstructive sleep apnea   . Psychiatric disorder   . Pulmonary embolism ([place])   . Shortness of breath     Patient Active Problem List   Diagnosis Date Noted  . Panic attack as reaction to stress 01/15/2017  . Fatigue 07/16/2016   . Hereditary and idiopathic peripheral neuropathy 07/16/2016  . Osteoarthritis of toe joint 02/25/2016  . Palpitations   . Paroxysmal atrial fibrillation (H[facility])   . Morbid obesity due to excess calories ([place])   . Palpitation 11/12/2015  . Chronic anticoagulation, secondary to PE 03/2014 12/27/2014  . Osteoarthritis of both knees 12/27/2014  . Chronic systolic CHF (congestive heart failure) ([place])   . Acute pulmonary embolism ([place]) 05/01/2014  . Hyperlipidemia 02/07/2014  . Non compliance w medication regimen 02/07/2014  . NSVT (nonsustained ventricular tachycardia) ([place]) 09/26/2013  . Alcohol abuse 08/10/2013  . Gout attack 06/29/2013  . Nonischemic cardiomyopathy ([place]) 01/26/2013  . Pain in joint, shoulder region 01/18/2013  . Diabetes mellitus ([place]) 04/13/2012  . Paresthesia and pain of extremity 04/13/2012  . Back pain 04/13/2012  . SOB (shortness of breath) 02/18/2012  . Tobacco user 02/18/2012  . Bilateral leg edema 12/21/2011  . Migraine 08/10/2011  . ED (erectile dysfunction) 07/11/2011  . Insomnia 07/11/2011  . Major depression 05/08/2011  . ATRIAL FIBRILLATION, PAROXYSMAL 09/18/2010  . Anxiety state 06/12/2010  . Morbid obesity ([place]) 06/11/2010  . Essential hypertension 06/11/2010  . Systolic CHF, chronic ([place]) 06/11/2010  . ESOPHAGEAL REFLUX 06/11/2010  . OSA (obstructive sleep apnea) with BiPap and oxygen 06/11/2010    Past Surgical History:  Procedure Laterality Date  . CARDIAC CATHETERIZATION    .  CARDIAC CATHETERIZATION N/A 08/13/2015   Procedure: Right/Left Heart Cath and Coronary Angiography;  Surgeon: [name], MD;  Location: [facility];  Service: Cardiovascular;  Laterality: N/A;  . TESTICLE SURGERY         Home Medications    Prior to Admission medications   Medication Sig Start Date End Date Taking? Authorizing Provider  aspirin EC 81 MG tablet Take 1 tablet (81 mg total) by mouth daily. 08/01/15   [name], MD  clonazePAM  (KLONOPIN) 1 MG tablet TAKE 1 TABLET BY MOUTH 3 TIMES DAILY AS NEEDED FOR ANXIETY 01/15/17   Alycia Rossetti, MD  clotrimazole (LOTRIMIN) 1 % cream Apply 1 application topically 2 (two) times daily. 03/18/16   Landis Martins, DPM  escitalopram (LEXAPRO) 20 MG tablet Take 1 tablet (20 mg total) by mouth daily. 01/15/17   Destrehan, Modena Nunnery, MD  fenofibrate 54 MG tablet TAKE 1 TABLET (54 MG TOTAL) BY MOUTH DAILY. 01/27/17   Shirley Friar, PA-C  KLOR-CON M20 20 MEQ tablet TAKE 1 TABLET (20 MEQ TOTAL) BY MOUTH DAILY. 04/02/17   Alycia Rossetti, MD  losartan (COZAAR) 50 MG tablet Take 1 tablet (50 mg total) by mouth at bedtime. 01/16/17   [name], MD  magnesium oxide (MAG-OX) 400 MG tablet Take 1 tablet (400 mg total) by mouth daily. Patient taking differently: Take 400 mg by mouth daily as needed (supplement).  01/02/16   Deboraha Sprang, MD  metoprolol succinate (TOPROL-XL) 100 MG 24 hr tablet Take 1 tablet (100 mg total) by mouth 2 (two) times daily. 03/18/17   [name], MD  NON FORMULARY Bipap, pressure 16/12 with 2L of O2    [provider]  oxyCODONE-acetaminophen (ROXICET) 5-325 MG tablet Take 1 tablet by mouth every 8 (eight) hours as needed for severe pain. 02/25/16   [place], Modena Nunnery, MD  pantoprazole (PROTONIX) 40 MG tablet TAKE 1 TABLET (40 MG TOTAL) BY MOUTH DAILY. 09/15/16   [place], Modena Nunnery, MD  potassium chloride SA (K-DUR,KLOR-CON) 20 MEQ tablet Take 20 mEq by mouth daily.    [provider]  sildenafil (REVATIO) 20 MG tablet TAKE 1 TABLET (20 MG TOTAL) BY MOUTH DAILY AS NEEDED. Patient taking differently: Take 20 mg by mouth daily as needed (ED).  07/30/16   Clegg, Amy D, NP  spironolactone (ALDACTONE) 25 MG tablet TAKE 1 TABLET (25 MG TOTAL) BY MOUTH DAILY. 09/22/16   Alycia Rossetti, MD  thiamine (VITAMIN B-1) 100 MG tablet Take 100 mg by mouth daily.    [provider]  torsemide (DEMADEX) 20 MG tablet Take 1 tablet (20 mg total) by  mouth daily. 01/16/17   [name], MD  torsemide (DEMADEX) 20 MG tablet Take 1 tablet (20 mg total) by mouth daily. 02/06/17   Bensimhon, Shaune Pascal, MD    Family History Family History  Problem Relation Age of Onset  . Cancer Mother        brain tumor  . Hypertension Mother   . Diabetes Father        Deceased, 31  . Heart disease Maternal Grandmother   . Hypertension Unknown        Family History  . Stroke Unknown        Family History  . Diabetes Unknown        Family History  . Diabetes Daughter     Social History Social History  Substance Use Topics  . Smoking  status: Current Every Day Smoker    Packs/day: 0.50    Years: 30.00    Types: Cigarettes  . Smokeless tobacco: Never Used     Comment: 10/2014- using patches  . Alcohol use 0.0 oz/week     Comment: nothing to drink since last OV with Dr. Harl Bowie per patient     Allergies   Ace inhibitors and Buspirone   Review of Systems Review of Systems ROS reviewed and all are negative for acute change except as noted in the HPI.  Physical Exam Updated Vital Signs BP 121/78 (BP Location: Left Arm)   Pulse 80   Temp 99.6 F (37.6 C) (Oral)   Resp (!) 26   SpO2 99%   Physical Exam  Constitutional: He is oriented to person, place, and time. Vital signs are normal. He appears well-developed and well-nourished.  Appears uncomfortable. Sitting uprightt   HENT:  Head: Normocephalic and atraumatic.  Right Ear: Hearing normal.  Left Ear: Hearing normal.  Eyes: Conjunctivae and EOM are normal. Pupils are equal, round, and reactive to light.  Neck: Normal range of motion. Neck supple.  Cardiovascular: Normal rate, regular rhythm, normal heart sounds and intact distal pulses.   BP equal BUE. Distal pulses appreciated   Pulmonary/Chest: Effort normal and breath sounds normal.  Abdominal: Soft. There is no tenderness.  Musculoskeletal: Normal range of motion.  Neurological: He is alert and oriented to person, place,  and time.  Skin: Skin is warm and dry.  Psychiatric: He has a normal mood and affect. His speech is normal and behavior is normal. Thought content normal.  Nursing note and vitals reviewed.  ED Treatments / Results  Labs (all labs ordered are listed, but only abnormal results are displayed) Labs Reviewed  BASIC METABOLIC PANEL - Abnormal; Notable for the following:       Result Value   Glucose, Bld 108 (*)    Creatinine, Ser 1.34 (*)    All other components within normal limits  CBC - Abnormal; Notable for the following:    WBC 11.5 (*)    All other components within normal limits  BRAIN NATRIURETIC PEPTIDE - Abnormal; Notable for the following:    B Natriuretic Peptide 158.1 (*)    All other components within normal limits  D-DIMER, QUANTITATIVE (NOT AT [facility]) - Abnormal; Notable for the following:    D-Dimer, Quant 1.16 (*)    All other components within normal limits  I-STAT TROPOININ, ED    EKG  EKG Interpretation  Date/Time:  Sunday Apr 12 2017 05:48:18 EDT Ventricular Rate:  78 PR Interval:  160 QRS Duration: 118 QT Interval:  390 QTC Calculation: 444 R Axis:   36 Text Interpretation:  Normal sinus rhythm Non-specific intra-ventricular conduction delay Borderline ECG No significant change was found Confirmed by CAMPOS  MD, Lennette Bihari ([id number]) on 04/12/2017 6:43:46 AM       Radiology Dg Chest 2 View  Result Date: 04/12/2017 CLINICAL DATA:  51 y/o  M; chest pain and shortness of breath. EXAM: CHEST  2 VIEW COMPARISON:  09/04/2016 chest radiograph FINDINGS: Stable mild cardiomegaly. Stable pulmonary venous hypertension. No focal consolidation, effusion, or pneumothorax identified. No acute osseous abnormality. IMPRESSION: Stable mild cardiomegaly and pulmonary venous hypertension. Electronically Signed   By: Kristine Garbe M.D.   On: 04/12/2017 06:14   Ct Angio Chest Pe W Or Wo Contrast  Result Date: 04/12/2017 CLINICAL DATA:  Pt c/o increasing shortness of breath  and chest pain. Patient states that the  pain is nothing like he has ever felt before. Patient describes the pain that radiates all around chest, into neck and into back. History of CHF. EXAM: CT ANGIOGRAPHY CHEST WITH CONTRAST TECHNIQUE: Multidetector CT imaging of the chest was performed using the standard protocol during bolus administration of intravenous contrast. Multiplanar CT image reconstructions and MIPs were obtained to evaluate the vascular anatomy. CONTRAST:  100 cc Isovue 370 COMPARISON:  CTA chest dated 09/04/2016. FINDINGS: Cardiovascular: Characterization of the peripheral segmental and subsegmental pulmonary arteries is markedly limited due to body habitus and associated quantum mottle. There is no central obstructing pulmonary embolism within the main, lobar or central segmental pulmonary arteries bilaterally. Thoracic aorta is normal in caliber and configuration. No aortic aneurysm or dissection. Cardiomegaly.  No pericardial effusion. Mediastinum/Nodes: No mass or enlarged lymph nodes within the mediastinum or perihilar regions. Esophagus appears normal. Trachea and central bronchi are unremarkable. Lungs/Pleura: Lungs are clear.  No pleural effusion or pneumothorax. Upper Abdomen: Limited images of the upper abdomen are unremarkable. Musculoskeletal: Mild degenerative change throughout the thoracic spine. No acute or suspicious osseous finding. Probable bilateral gynecomastia. Review of the MIP images confirms the above findings. IMPRESSION: 1. No central obstructing pulmonary embolism. More peripheral segmental and subsegmental pulmonary artery branches cannot be confidently evaluated due to patient body habitus and associated quantum mottle. As such, small peripheral nonocclusive pulmonary embolism cannot be excluded on this exam. 2. Cardiomegaly.  No pericardial effusion. 3. No aortic aneurysm or dissection. 4. Lungs are clear. No pneumonia or pulmonary edema. No pneumothorax. Electronically  Signed   By: Franki Cabot M.D.   On: 04/12/2017 08:06    Procedures Procedures (including critical care time)  Medications Ordered in ED Medications  morphine 4 MG/ML injection 4 mg (4 mg Intravenous Given 04/12/17 0649)  iopamidol (ISOVUE-370) 76 % injection (100 mLs  Contrast Given 04/12/17 0732)  ondansetron (ZOFRAN) injection 4 mg (4 mg Intravenous Given 04/12/17 0726)  gi cocktail (Maalox,Lidocaine,Donnatal) (30 mLs Oral Given 04/12/17 0804)  HYDROmorphone (DILAUDID) injection 1 mg (1 mg Intravenous Given 04/12/17 0815)    Initial Impression / Assessment and Plan / ED Course  I have reviewed the triage vital signs and the nursing notes.  Pertinent labs & imaging results that were available during my care of the patient were reviewed by me and considered in my medical decision making (see chart for details).  Final Clinical Impressions(s) / ED Diagnoses  {I have reviewed and evaluated the relevant laboratory values. {I have reviewed and evaluated the relevant imaging studies. {I have interpreted the relevant EKG. {I have reviewed the relevant previous healthcare records.  {I obtained HPI from historian. {Patient discussed with supervising physician.  ED Course:  Assessment: Pt is a 51 y.o. male presents with CP x 3 hours. Constant pressure in chest with radiation into back. No N/V. No diaphoresis. Risk Factors for ACS: HTN, Obesity. Given analegsia in ED. Patient is to be discharged with recommendation to follow up with Cardiology in regards to today's hospital visit. Chest pain is not likely of cardiac or pulmonary etiology d/t presentation VSS, no tracheal deviation, no JVD or new murmur, RRR, breath sounds equal bilaterally, EKG without acute abnormalities, negative troponin x2, and negative CXR. Due to hx of PE, ordered a CT angio, which showed no central obstructing pulmonary embolism. More segmental branches could not be excluded due to body habitus. Discussed with attending physician.  Well's Score 4.5. Will add on D Dimer. If elevated, will likely require VQ scan.  10:29 AM- Dimer elevated. Likely PE. Will plan on admission to family medicine for follow up VQ scan and start on Heparin.   Disposition/Plan:  Admit Pt acknowledges and agrees with plan  Supervising Physician Forde Dandy, MD  Final diagnoses:  Chest pressure  Other acute pulmonary embolism without acute cor pulmonale [facility])    New Prescriptions New Prescriptions   No medications on file     Shary Decamp, Hershal Coria 04/12/17 [facility]    Forde Dandy, MD 04/12/17 [phone]

## 2017-04-12 NOTE — H&P (Signed)
[facility] Admission History and Physical Service Pager: [phone]  Patient name: [name] Medical record number: [id number] Date of birth: 1966-05-22 Age: 51 y.o. Gender: male  Primary Care Provider: Buelah Manis, Modena Nunnery, MD Consultants: None Code Status: FULL   Chief Complaint: chest tightness, neck pain   Assessment and Plan: [name] is a 51 y.o. male presenting with chest tightness radiating to neck.  PMH is significant for HTN, CHF, T2DM, sleep apnea, tobacco use, alcohol use, history of PE, panic attacks and depression.   Chest tightness.  Currently stable on RA w/ normal VS.  Radiating to back of neck and worse when lying on his back. No associated nausea, vomiting or diaphoresis.   Patient presented to ED after no improvement in symptoms with taking his home Demadex or [place]. In ED received morphine, dilaudid and GI cocktail for pain.  istat troponin in ED negative and EKG with normal findings.  BNP elevated to 158.1 and white count mildly elevated to 11.5.  CXR showed stable mild cardiomegaly and pulmonary venous hypertension.  Differentials include ACS, PE, vs MSK etiology.  ACS considered iTrop negative but sTrop 0.05.  However, slightly elevated trop more likely related to respiratory status, as EKG without evidence of ischemia.  D-dimer elevated to 1.16. No history of recent travel or immobility however given large habitus and sedentary lifestyle, PE must be considered.   In setting of elevated d-dimer, CTA ordered which revealed no central obstructing PE, however more peripheral segmental and subsegmental pulmonary artery branches cannot be confidently evaluated due to patient body habitus, cannot exclude small peripheral nonocclusive PE.  Patient started on heparin drip to treat for presumed PE.  Chest and neck pain reproducible on palpation, suggestive of some possible MSK component vs referred pain 2/2 presumed PE.   -Admit to FPTS, attending  Dr. Nori Riis -continuous cardiac monitoring  -continuous pulse ox  -Heparin gtt for presumed PE >> possible transition to Xarelto 5/14  -Consider V/Q scan. -Trend troponins   -AM EKG -Vasc doppler LE [place] ordered  -AM BMET, CBC -HIV pending  -UDS pending  -vitals per unit routine   HTN.  Stable. Normotensive on admission with BP 133/75.  At home on Losartan 50 mg, Metoprolol XL 100 mg BID.  -Resume home meds   CHF.  Follows with Cardiology, Dr. Aundra Dubin.  Last Echo 01/2017 with EF 25-30% and diffuse hypokinesis.  Per chart review, cath in 2106 essentially normal.  On admission, BNP elevated to 158.1.  With no signs of fluid overload on exam.  Lung exam clear with no crackles appreciated and no significant LE edema noted.  At home on Metoprolol XL 100 mg BID,  Spironolactone 25 mg daily, and Demadex 20 mg.   -Discussed with cards on admission, ok to continue Metoprolol XL BID -Could consider formal consult in am given slight bump in Trop 0.05. -Continue home Torsemide  -I's and O's  -daily weights   T2DM.  Stable.  Last A1c in 01/2017 was 6.1%.  Reports not taking Metformin as his A1c is well controlled.  -A1c pending  -will monitor CBGs with daily labs  Sleep apnea.  Reports compliance with home Bipap with 2L at night.   -Continue Bipap nightly   Depression/Anxiety.  Stable.  At home on Lexapro 20 mg daily and Klonopin 1 mg TID.  -Continue home Lexapro -Klonopin 1 mg TID PRN for anxiety  Morbid obesity.  Weight 410 lbs.   -Heart healthy-carb modified diet -Encourage  weight loss   Tobacco use.  Patient is a current everyday smoker. Smokes about 1/3 to 1 PPD.   -Nicotine 14 mg transdermal patch daily  -smoking cessation counseling  Alcohol use.  Reports occasional use however he notes he binges when he does drink.  Reports drinking up to 14 shots of liquor during binges.  No drug use.  Placed on CIWA protocol -UDS pending  -monitor on CIWA    FEN/GI: HH Carb modified  Prophylaxis:  Heparin gtt (treatment dose for PE), Protonix  Disposition: Admit to FPTS, attending Dr. Nori Riis    History of Present Illness:  [name] is a 51 y.o. male presenting with chest tightness/pain. Reports chest tightness, onset yesterday evening. Later on worsening CP with lying down.  He notes he took his Demadex with no improvement.  He used TUMS with no improvement.  He reports CP radiates from left upper chest to left back.  He reports pleuritic pain.   He came to hospital because symptoms did not improve.  He reports PE previously was asymptomatic, he did have some hemoptysis at that time but does not currently.  Last PE was in 03/2014.  He reports he discontinued Xarelto a couple of years ago, per cardiology.   He reports associated SOB and fatigue.  He reports LE edema.  Patient reports compliance with BiPAP w. 2L at night.   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  He smokes 1/3-1 ppd.  He reports up to 14 shots of liquor regularly. No drug use.  Review Of Systems: Per HPI with the following additions:   Review of Systems  Constitutional: Negative for chills and fever.  HENT: Negative for congestion.   Respiratory: Positive for shortness of breath. Negative for cough, hemoptysis, sputum production and wheezing.   Cardiovascular: Positive for chest pain and leg swelling. Negative for palpitations and orthopnea.  Gastrointestinal: Negative for abdominal pain, diarrhea, heartburn, nausea and vomiting.  Genitourinary: Negative for dysuria, frequency and urgency.  Musculoskeletal: Positive for neck pain.  Neurological: Negative for weakness.   Patient Active Problem List   Diagnosis Date Noted  . Pulmonary emboli ([facility]) 04/12/2017  . Elevated serum creatinine 04/12/2017  . Chest pressure   . Panic attack as reaction to stress 01/15/2017  . Fatigue 07/16/2016  . Hereditary and idiopathic peripheral neuropathy 07/16/2016  . Osteoarthritis of toe joint 02/25/2016  . Palpitations   . Paroxysmal  atrial fibrillation (HCC)   . Morbid obesity due to excess calories ([place])   . Palpitation 11/12/2015  . Chronic anticoagulation, secondary to PE 03/2014 12/27/2014  . Osteoarthritis of both knees 12/27/2014  . Chronic systolic CHF (congestive heart failure) ([place])   . Acute pulmonary embolism ([place]) 05/01/2014  . Hyperlipidemia 02/07/2014  . Non compliance w medication regimen 02/07/2014  . NSVT (nonsustained ventricular tachycardia) ([place]) 09/26/2013  . Alcohol abuse 08/10/2013  . Gout attack 06/29/2013  . Nonischemic cardiomyopathy ([place]) 01/26/2013  . Pain in joint, shoulder region 01/18/2013  . Diabetes mellitus ([place]) 04/13/2012  . Paresthesia and pain of extremity 04/13/2012  . Back pain 04/13/2012  . SOB (shortness of breath) 02/18/2012  . Tobacco user 02/18/2012  . Bilateral leg edema 12/21/2011  . Migraine 08/10/2011  . ED (erectile dysfunction) 07/11/2011  . Insomnia 07/11/2011  . Major depression 05/08/2011  . ATRIAL FIBRILLATION, PAROXYSMAL 09/18/2010  . Anxiety state 06/12/2010  . Morbid obesity ([place]) 06/11/2010  . Essential hypertension 06/11/2010  . Systolic CHF, chronic ([place]) 06/11/2010  . Esophageal reflux  06/11/2010  . OSA (obstructive sleep apnea) with BiPap and oxygen 06/11/2010   Past Medical History: Past Medical History:  Diagnosis Date  . Alcohol abuse   . Anxiety state, unspecified   . Atrial fibrillation ([place])   . CHF (congestive heart failure) ([place])   . Chronic systolic heart failure ([place])   . Edema   . History of medication noncompliance   . Migraine   . Obesity, unspecified   . Obstructive sleep apnea   . Psychiatric disorder   . Pulmonary embolism ([place])   . Shortness of breath    Past Surgical History: Past Surgical History:  Procedure Laterality Date  . CARDIAC CATHETERIZATION    . CARDIAC CATHETERIZATION N/A 08/13/2015   Procedure: Right/Left Heart Cath and Coronary Angiography;  Surgeon: [name], MD;  Location: [facility];  Service: Cardiovascular;  Laterality: N/A;  . TESTICLE SURGERY     Social History: Social History  Substance Use Topics  . Smoking status: Current Every Day Smoker    Packs/day: 0.50    Years: 30.00    Types: Cigarettes  . Smokeless tobacco: Never Used     Comment: 10/2014- using patches  . Alcohol use 0.0 oz/week     Comment: nothing to drink since last OV with Dr. 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  Denies alcohol use, denies drug use.  Please also refer to relevant sections of EMR.  Family History: Family History  Problem Relation Age of Onset  . Cancer Mother        brain tumor  . Hypertension Mother   . Diabetes Father        Deceased, 45  . Heart disease Maternal Grandmother   . Hypertension Unknown        Family History  . Stroke Unknown        Family History  . Diabetes Unknown        Family History  . Diabetes Daughter    Allergies and Medications: Allergies  Allergen Reactions  . Ace Inhibitors Anaphylaxis and Swelling  . Buspirone Other (See Comments)    dizziness   No current facility-administered medications on file prior to encounter.    Current Outpatient Prescriptions on File Prior to Encounter  Medication Sig Dispense Refill  . aspirin EC 81 MG tablet Take 1 tablet (81 mg total) by mouth daily. 90 tablet 3  . clonazePAM (KLONOPIN) 1 MG tablet TAKE 1 TABLET BY MOUTH 3 TIMES DAILY AS NEEDED FOR ANXIETY (Patient taking differently: Take 1 mg by mouth 3 (three) times daily as needed for anxiety. ) 90 tablet 2  . escitalopram (LEXAPRO) 20 MG tablet Take 1 tablet (20 mg total) by mouth daily. 30 tablet 6  . fenofibrate 54 MG tablet TAKE 1 TABLET (54 MG TOTAL) BY MOUTH DAILY. 90 tablet 3  . KLOR-CON M20 20 MEQ tablet TAKE 1 TABLET (20 MEQ TOTAL) BY MOUTH DAILY. (Patient taking differently: Take 20 mEq by mouth daily as needed (for cramps). ) 30 tablet 1  . losartan (COZAAR) 50 MG tablet Take 1 tablet (50  mg total) by mouth at bedtime. (Patient taking differently: Take 50 mg by mouth daily. ) 30 tablet 6  . magnesium oxide (MAG-OX) 400 MG tablet Take 1 tablet (400 mg total) by mouth daily. (Patient taking differently: Take 400 mg by mouth daily as needed (supplement). ) 30 tablet 11  . metoprolol succinate (TOPROL-XL) 100 MG 24  hr tablet Take 1 tablet (100 mg total) by mouth 2 (two) times daily. (Patient taking differently: Take 150 mg by mouth 2 (two) times daily. ) 60 tablet 6  . NON FORMULARY Bipap, pressure 16/12 with 2L of O2    . pantoprazole (PROTONIX) 40 MG tablet TAKE 1 TABLET (40 MG TOTAL) BY MOUTH DAILY. 30 tablet 6  . spironolactone (ALDACTONE) 25 MG tablet TAKE 1 TABLET (25 MG TOTAL) BY MOUTH DAILY. 30 tablet 6  . thiamine (VITAMIN B-1) 100 MG tablet Take 100 mg by mouth daily.    Marland Kitchen torsemide (DEMADEX) 20 MG tablet Take 1 tablet (20 mg total) by mouth daily. (Patient taking differently: Take 10-20 mg by mouth daily as needed (for fluid). ) 30 tablet 6  . clotrimazole (LOTRIMIN) 1 % cream Apply 1 application topically 2 (two) times daily. (Patient not taking: Reported on 04/12/2017) 30 g 0  . sildenafil (REVATIO) 20 MG tablet TAKE 1 TABLET (20 MG TOTAL) BY MOUTH DAILY AS NEEDED. (Patient taking differently: Take 20 mg by mouth daily as needed (ED). ) 15 tablet 3  . torsemide (DEMADEX) 20 MG tablet Take 1 tablet (20 mg total) by mouth daily. (Patient not taking: Reported on 04/12/2017) 90 tablet 3  . [DISCONTINUED] pravastatin (PRAVACHOL) 40 MG tablet Take 40 mg by mouth daily.    . [DISCONTINUED] sertraline (ZOLOFT) 50 MG tablet Take 1 tablet (50 mg total) by mouth daily. Take  50mg  daily for 1 week then increase to 100mg  daily 60 tablet 2   Objective: BP 128/81 (BP Location: Left Arm)   Pulse 81   Temp 99.4 F (37.4 C) (Oral)   Resp 16   Ht 5\' 9"  (1.753 m)   Wt (!) 410 lb 11.2 oz (186.3 kg)   SpO2 99%   BMI 60.65 kg/m  Exam: General: 51 yo M, morbidly obese, sitting on edge of  bed, NAD  Eyes: EOMI, PERRL, sclera white ENTM: MMM, o/p clear  Neck: supple, no JVD  Cardiovascular: RRR no MRG, palpable pulses Respiratory: CTAB, comfortable work of breathing on room air, no wheezing or crackles appreciated  Gastrointestinal: soft, NTND, +bs  MSK: mild pedal edema bilaterally, no tenderness noted  Derm: skin is warm and dry  Neuro: AOx3, no focal deficits; follows commands Psych: normal mood and affect; comments somewhat socially inappropriate.  However, patient non-combative.   Labs and Imaging: CBC BMET   Recent Labs Lab 04/12/17 0553  WBC 11.5*  HGB 15.1  HCT 42.5  PLT 220    Recent Labs Lab 04/12/17 0553  NA 138  K 3.6  CL 104  CO2 26  BUN 17  CREATININE 1.34*  GLUCOSE 108*  CALCIUM 9.1     Dg Chest 2 View  Result Date: 04/12/2017 CLINICAL DATA:  51 y/o  M; chest pain and shortness of breath. EXAM: CHEST  2 VIEW COMPARISON:  09/04/2016 chest radiograph FINDINGS: Stable mild cardiomegaly. Stable pulmonary venous hypertension. No focal consolidation, effusion, or pneumothorax identified. No acute osseous abnormality. IMPRESSION: Stable mild cardiomegaly and pulmonary venous hypertension. Electronically Signed   By: Kristine Garbe M.D.   On: 04/12/2017 06:14   Ct Angio Chest Pe W Or Wo Contrast  Result Date: 04/12/2017 CLINICAL DATA:  Pt c/o increasing shortness of breath and chest pain. Patient states that the pain is nothing like he has ever felt before. Patient describes the pain that radiates all around chest, into neck and into back. History of CHF. EXAM: CT ANGIOGRAPHY CHEST WITH  CONTRAST TECHNIQUE: Multidetector CT imaging of the chest was performed using the standard protocol during bolus administration of intravenous contrast. Multiplanar CT image reconstructions and MIPs were obtained to evaluate the vascular anatomy. CONTRAST:  100 cc Isovue 370 COMPARISON:  CTA chest dated 09/04/2016. FINDINGS: Cardiovascular: Characterization of  the peripheral segmental and subsegmental pulmonary arteries is markedly limited due to body habitus and associated [place]ottle. There is no central obstructing pulmonary embolism within the main, lobar or central segmental pulmonary arteries bilaterally. Thoracic aorta is normal in caliber and configuration. No aortic aneurysm or dissection. Cardiomegaly.  No pericardial effusion. Mediastinum/Nodes: No mass or enlarged lymph nodes within the mediastinum or perihilar regions. Esophagus appears normal. Trachea and central bronchi are unremarkable. Lungs/Pleura: Lungs are clear.  No pleural effusion or pneumothorax. Upper Abdomen: Limited images of the upper abdomen are unremarkable. Musculoskeletal: Mild degenerative change throughout the thoracic spine. No acute or suspicious osseous finding. Probable bilateral gynecomastia. Review of the MIP images confirms the above findings. IMPRESSION: 1. No central obstructing pulmonary embolism. More peripheral segmental and subsegmental pulmonary artery branches cannot be confidently evaluated due to patient body habitus and associated [place]ottle. As such, small peripheral nonocclusive pulmonary embolism cannot be excluded on this exam. 2. Cardiomegaly.  No pericardial effusion. 3. No aortic aneurysm or dissection. 4. Lungs are clear. No pneumonia or pulmonary edema. No pneumothorax. Electronically Signed   By: Franki Cabot M.D.   On: 04/12/2017 08:06    Lovenia Kim, MD 04/12/2017, 5:28 PM PGY-1, [facility] pager: [phone], text pages welcome  I have separately seen and examined the patient. I have discussed the findings and exam with Dr Reesa Chew and agree with the above note.  My changes/additions are outlined in BLUE.    Ashly M. Lajuana Ripple, DO PGY-3, [facility]

## 2017-04-12 NOTE — ED Notes (Signed)
Pt states he started to get chills

## 2017-04-12 NOTE — Progress Notes (Signed)
Pt states he is not sure right now if he wants to wear BIPAP QHS. Pt agreed to call RT if he changes his mind.

## 2017-04-12 NOTE — ED Notes (Signed)
Patient transported to CT 

## 2017-04-12 NOTE — Progress Notes (Signed)
ANTICOAGULATION CONSULT NOTE - Initial Consult  Pharmacy Consult for Heparin Indication: pulmonary embolus  Allergies  Allergen Reactions  . Ace Inhibitors Anaphylaxis and Swelling  . Buspirone Other (See Comments)    dizziness    Patient Measurements: Weight: (!) 400 lb (181.4 kg) (verbal per patient - will need to reweigh)  Ideal body weight: 69.6 kg Heparin Dosing Weight: ~115 kg  Vital Signs: Temp: 99.4 F (37.4 C) (05/13 0650) Temp Source: Oral (05/13 0650) BP: 129/86 (05/13 1030) Pulse Rate: 73 (05/13 1030)  Labs:  Recent Labs  04/12/17 0553  HGB 15.1  HCT 42.5  PLT 220  CREATININE 1.34*    Estimated Creatinine Clearance: 109 mL/min (A) (by C-G formula based on SCr of 1.34 mg/dL (H)).   Medical History: Past Medical History:  Diagnosis Date  . Alcohol abuse   . Anxiety state, unspecified   . Atrial fibrillation ([place])   . CHF (congestive heart failure) ([place])   . Chronic systolic heart failure ([place])   . Edema   . History of medication noncompliance   . Migraine   . Obesity, unspecified   . Obstructive sleep apnea   . Psychiatric disorder   . Pulmonary embolism ([place])   . Shortness of breath     Assessment: 51 year old male with history of PE and now probable new PE in setting of elevated D-dimer and inconclusive CT Angio (unable to rule out segmental PE) to start IV heparin per pharmacy consult.   Patient reports weight of 400 lbs (181kg ) so will base calculations of heparin dosing wight off of this for now and request RN to re-weigh when able. CBC is within normal limits. Trop is mildly up at 0.05. INR at baseline. States has not been on anticoagulants recently but did receive Xarelto with prior PE a couple of years ago. Patient is agreeable to heparin.   Goal of Therapy:  Heparin level 0.3-0.7 units/ml Monitor platelets by anticoagulation protocol: Yes   Plan:  Heparin 6000 unit IV x1 Then Heparin drip at 1950 units/hr. Heparin level in 6  hours.  Daily heparin level and CBC while on therapy.   Sloan Leiter, PharmD, BCPS Clinical Pharmacist Clinical phone 04/12/2017 until 3:30 PM- [phone] After hours, please call [phone] 04/12/2017,11:07 AM

## 2017-04-12 NOTE — ED Triage Notes (Signed)
Patient with increasing shortness of breath and chest pain.  Patient took 30 mg of Demadex around 0330 this morning thinking that he had a fluid build up.  Patient states that the pain is nothing like he has ever felt before.  Patient describes the pain that radiates all around chest, into neck and into back.

## 2017-04-12 NOTE — ED Notes (Signed)
Lunch tray ordered; heart healthy, carb modified

## 2017-04-13 ENCOUNTER — Other Ambulatory Visit (HOSPITAL_COMMUNITY): Payer: Medicare Other

## 2017-04-13 ENCOUNTER — Observation Stay (HOSPITAL_COMMUNITY): Payer: Medicare Other

## 2017-04-13 ENCOUNTER — Observation Stay (HOSPITAL_BASED_OUTPATIENT_CLINIC_OR_DEPARTMENT_OTHER): Payer: Medicare Other

## 2017-04-13 DIAGNOSIS — R0602 Shortness of breath: Secondary | ICD-10-CM | POA: Diagnosis not present

## 2017-04-13 DIAGNOSIS — R0789 Other chest pain: Secondary | ICD-10-CM | POA: Diagnosis not present

## 2017-04-13 DIAGNOSIS — I2699 Other pulmonary embolism without acute cor pulmonale: Secondary | ICD-10-CM | POA: Diagnosis not present

## 2017-04-13 DIAGNOSIS — Z9111 Patient's noncompliance with dietary regimen: Secondary | ICD-10-CM | POA: Diagnosis not present

## 2017-04-13 DIAGNOSIS — K219 Gastro-esophageal reflux disease without esophagitis: Secondary | ICD-10-CM | POA: Diagnosis not present

## 2017-04-13 DIAGNOSIS — R7989 Other specified abnormal findings of blood chemistry: Secondary | ICD-10-CM | POA: Diagnosis not present

## 2017-04-13 DIAGNOSIS — R079 Chest pain, unspecified: Secondary | ICD-10-CM | POA: Diagnosis not present

## 2017-04-13 DIAGNOSIS — I313 Pericardial effusion (noninflammatory): Secondary | ICD-10-CM | POA: Diagnosis not present

## 2017-04-13 DIAGNOSIS — I214 Non-ST elevation (NSTEMI) myocardial infarction: Secondary | ICD-10-CM | POA: Diagnosis not present

## 2017-04-13 DIAGNOSIS — E0821 Diabetes mellitus due to underlying condition with diabetic nephropathy: Secondary | ICD-10-CM | POA: Diagnosis not present

## 2017-04-13 DIAGNOSIS — M542 Cervicalgia: Secondary | ICD-10-CM | POA: Diagnosis present

## 2017-04-13 DIAGNOSIS — I5043 Acute on chronic combined systolic (congestive) and diastolic (congestive) heart failure: Secondary | ICD-10-CM | POA: Diagnosis not present

## 2017-04-13 DIAGNOSIS — I428 Other cardiomyopathies: Secondary | ICD-10-CM | POA: Diagnosis not present

## 2017-04-13 DIAGNOSIS — I5022 Chronic systolic (congestive) heart failure: Secondary | ICD-10-CM | POA: Diagnosis not present

## 2017-04-13 DIAGNOSIS — I5021 Acute systolic (congestive) heart failure: Secondary | ICD-10-CM | POA: Diagnosis not present

## 2017-04-13 DIAGNOSIS — F17210 Nicotine dependence, cigarettes, uncomplicated: Secondary | ICD-10-CM | POA: Diagnosis present

## 2017-04-13 DIAGNOSIS — F410 Panic disorder [episodic paroxysmal anxiety] without agoraphobia: Secondary | ICD-10-CM | POA: Diagnosis present

## 2017-04-13 DIAGNOSIS — I447 Left bundle-branch block, unspecified: Secondary | ICD-10-CM | POA: Diagnosis not present

## 2017-04-13 DIAGNOSIS — I110 Hypertensive heart disease with heart failure: Secondary | ICD-10-CM | POA: Diagnosis not present

## 2017-04-13 DIAGNOSIS — E119 Type 2 diabetes mellitus without complications: Secondary | ICD-10-CM | POA: Diagnosis present

## 2017-04-13 DIAGNOSIS — I10 Essential (primary) hypertension: Secondary | ICD-10-CM | POA: Diagnosis not present

## 2017-04-13 DIAGNOSIS — F1010 Alcohol abuse, uncomplicated: Secondary | ICD-10-CM | POA: Diagnosis not present

## 2017-04-13 DIAGNOSIS — Z7982 Long term (current) use of aspirin: Secondary | ICD-10-CM | POA: Diagnosis not present

## 2017-04-13 DIAGNOSIS — E785 Hyperlipidemia, unspecified: Secondary | ICD-10-CM | POA: Diagnosis present

## 2017-04-13 DIAGNOSIS — F411 Generalized anxiety disorder: Secondary | ICD-10-CM | POA: Diagnosis not present

## 2017-04-13 DIAGNOSIS — Z7901 Long term (current) use of anticoagulants: Secondary | ICD-10-CM | POA: Diagnosis not present

## 2017-04-13 DIAGNOSIS — Z888 Allergy status to other drugs, medicaments and biological substances status: Secondary | ICD-10-CM | POA: Diagnosis not present

## 2017-04-13 DIAGNOSIS — Z720 Tobacco use: Secondary | ICD-10-CM | POA: Diagnosis not present

## 2017-04-13 DIAGNOSIS — F329 Major depressive disorder, single episode, unspecified: Secondary | ICD-10-CM | POA: Diagnosis present

## 2017-04-13 DIAGNOSIS — Z6841 Body Mass Index (BMI) 40.0 and over, adult: Secondary | ICD-10-CM | POA: Diagnosis not present

## 2017-04-13 DIAGNOSIS — Z79899 Other long term (current) drug therapy: Secondary | ICD-10-CM | POA: Diagnosis not present

## 2017-04-13 DIAGNOSIS — Z86718 Personal history of other venous thrombosis and embolism: Secondary | ICD-10-CM | POA: Diagnosis not present

## 2017-04-13 DIAGNOSIS — N179 Acute kidney failure, unspecified: Secondary | ICD-10-CM | POA: Diagnosis not present

## 2017-04-13 DIAGNOSIS — R600 Localized edema: Secondary | ICD-10-CM | POA: Diagnosis not present

## 2017-04-13 LAB — CBC
HCT: 41.5 % (ref 39.0–52.0)
Hemoglobin: 14.5 g/dL (ref 13.0–17.0)
MCH: 29.1 pg (ref 26.0–34.0)
MCHC: 34.9 g/dL (ref 30.0–36.0)
MCV: 83.3 fL (ref 78.0–100.0)
PLATELETS: 192 10*3/uL (ref 150–400)
RBC: 4.98 MIL/uL (ref 4.22–5.81)
RDW: 14.8 % (ref 11.5–15.5)
WBC: 10.2 10*3/uL (ref 4.0–10.5)

## 2017-04-13 LAB — PROTIME-INR
INR: 1.1
PROTHROMBIN TIME: 14.2 s (ref 11.4–15.2)

## 2017-04-13 LAB — BASIC METABOLIC PANEL
ANION GAP: 9 (ref 5–15)
BUN: 22 mg/dL — ABNORMAL HIGH (ref 6–20)
CHLORIDE: 102 mmol/L (ref 101–111)
CO2: 28 mmol/L (ref 22–32)
CREATININE: 1.5 mg/dL — AB (ref 0.61–1.24)
Calcium: 8.8 mg/dL — ABNORMAL LOW (ref 8.9–10.3)
GFR calc non Af Amer: 53 mL/min — ABNORMAL LOW (ref >60)
Glucose, Bld: 111 mg/dL — ABNORMAL HIGH (ref 65–99)
Potassium: 3.6 mmol/L (ref 3.5–5.1)
SODIUM: 139 mmol/L (ref 135–145)

## 2017-04-13 LAB — HEMOGLOBIN A1C
HEMOGLOBIN A1C: 6.1 % — AB (ref 4.8–5.6)
Mean Plasma Glucose: 128 mg/dL

## 2017-04-13 LAB — HEPARIN LEVEL (UNFRACTIONATED)
HEPARIN UNFRACTIONATED: 0.29 [IU]/mL — AB (ref 0.30–0.70)
HEPARIN UNFRACTIONATED: 0.35 [IU]/mL (ref 0.30–0.70)

## 2017-04-13 LAB — GLUCOSE, CAPILLARY: Glucose-Capillary: 118 mg/dL — ABNORMAL HIGH (ref 65–99)

## 2017-04-13 LAB — SEDIMENTATION RATE: Sed Rate: 25 mm/hr — ABNORMAL HIGH (ref 0–16)

## 2017-04-13 LAB — TROPONIN I
TROPONIN I: 3.28 ng/mL — AB (ref <0.03)
Troponin I: 1.08 ng/mL (ref <0.03)
Troponin I: 1.99 ng/mL (ref <0.03)
Troponin I: 3.37 ng/mL (ref <0.03)

## 2017-04-13 LAB — HIV ANTIBODY (ROUTINE TESTING W REFLEX): HIV SCREEN 4TH GENERATION: NONREACTIVE

## 2017-04-13 MED ORDER — SODIUM CHLORIDE 0.9% FLUSH
3.0000 mL | Freq: Two times a day (BID) | INTRAVENOUS | Status: DC
  Administered 2017-04-13: 3 mL via INTRAVENOUS

## 2017-04-13 MED ORDER — SODIUM CHLORIDE 0.9% FLUSH: 3.0000 mL | INTRAVENOUS | Status: DC | PRN

## 2017-04-13 MED ORDER — SODIUM CHLORIDE 0.9 % IV SOLN: 250.0000 mL | INTRAVENOUS | Status: DC | PRN

## 2017-04-13 MED ORDER — SODIUM CHLORIDE 0.9 % IV SOLN
INTRAVENOUS | Status: DC
  Administered 2017-04-14: 07:00:00 via INTRAVENOUS

## 2017-04-13 MED ORDER — TECHNETIUM TO 99M ALBUMIN AGGREGATED
4.0000 | Freq: Once | INTRAVENOUS | Status: AC | PRN
  Administered 2017-04-13: 4 via INTRAVENOUS

## 2017-04-13 MED ORDER — NITROGLYCERIN 0.4 MG SL SUBL: 0.4000 mg | SUBLINGUAL_TABLET | SUBLINGUAL | Status: DC | PRN

## 2017-04-13 MED ORDER — ASPIRIN 81 MG PO CHEW
81.0000 mg | CHEWABLE_TABLET | ORAL | Status: AC
  Administered 2017-04-14: 81 mg via ORAL
  Filled 2017-04-13: qty 1

## 2017-04-13 MED ORDER — TECHNETIUM TC 99M DIETHYLENETRIAME-PENTAACETIC ACID: 30.0000 | Freq: Once | INTRAVENOUS | Status: DC | PRN

## 2017-04-13 NOTE — Progress Notes (Signed)
VASCULAR LAB PRELIMINARY  PRELIMINARY  PRELIMINARY  PRELIMINARY  Bilateral lower extremity venous duplex completed.    Preliminary report:  There is no obvious evidence of DVT or SVT noted in the bilateral lower extremities.   Neldon Shepard, RVT 04/13/2017, 11:46 AM

## 2017-04-13 NOTE — Progress Notes (Addendum)
Notified attending physicians that wife of pt has requested a letter stating that pt is here in hospital.  [phone] Completed and signed letter has been printed and given to pt.

## 2017-04-13 NOTE — Progress Notes (Signed)
Bipap was set up at bedside for patient with 2L bled in and water in chamber. Patient is set on Bipap auto 20/6. Patient states he can place himself on when ready. RT will continue to monitor.

## 2017-04-13 NOTE — Progress Notes (Signed)
ANTICOAGULATION CONSULT NOTE - Follow Up Consult  Pharmacy Consult for Heparin Indication: pulmonary embolus, probable  Allergies  Allergen Reactions  . Ace Inhibitors Anaphylaxis and Swelling  . Buspirone Other (See Comments)    dizziness    Patient Measurements: Height: 5\' 9"  (175.3 cm) Weight: (!) 410 lb 11.2 oz (186.3 kg) IBW/kg (Calculated) : 70.7 Vital Signs: Temp: 97.9 F (36.6 C) (05/13 2357) Temp Source: Oral (05/13 2357) BP: 118/48 (05/13 2357) Pulse Rate: 66 (05/13 2357)  Labs:  Recent Labs  04/12/17 0553 04/12/17 1322 04/12/17 1813 04/13/17 0035  HGB 15.1  --   --  14.5  HCT 42.5  --   --  41.5  PLT 220  --   --  192  HEPARINUNFRC  --   --  0.26* 0.35  CREATININE 1.34*  --   --  1.50*  TROPONINI  --  0.05* 0.29* 1.08*    Estimated Creatinine Clearance: 99.6 mL/min (A) (by C-G formula based on SCr of 1.5 mg/dL (H)).    Assessment: Hx of PE, probable new PE, awaiting dopplers, heparin level on low end of therapeutic range after rate increase  Goal of Therapy:  Heparin level 0.3-0.7 units/ml Monitor platelets by anticoagulation protocol: Yes   Plan:  -Inc heparin to 2250 units/hr to prevent sub-therapeutic level in setting of possible PE -1000 HL  Narda Bonds 04/13/2017,1:52 AM

## 2017-04-13 NOTE — Progress Notes (Addendum)
On 04/12/17, I reported off to PM nurse to secure a BiPap HS for pt.  Presently, pt has NOT received BiPap.  I will report off again to PM nurse of same.  Spoke with Angela Nevin, Respiratory, and requested for her to report to PM respiratory staff to make sure that pt received his BiPap tonight.  She assures me that it will get done.

## 2017-04-13 NOTE — Progress Notes (Signed)
Family Medicine Teaching Service Daily Progress Note Intern Pager: [phone]  Patient name: [name] Medical record number: [id number] Date of birth: 11/08/1966 Age: 51 y.o. Gender: male  Primary Care Provider: Alycia Rossetti, MD Consultants: Cardiology Code Status: Full  Pt Overview and Major Events to Date:  5/13 Admit FPTS for likely repeat PE  Assessment and Plan: [name] is a 51 y.o. male presenting with chest tightness radiating to neck.  PMH is significant for HTN, HFrEF (Last Echo 01/2017 with EF 25-30% and diffuse hypokinesis.), T2DM ( Last A1c in 01/2017 was 6.1%), sleep apnea, tobacco use, alcohol use, history of PE, panic attacks and depression.   Chest tightness/dyspnea  - Considering PE, ACS, MSK as differential. No associated nausea, vomiting or diaphoresis and initial EKG without evidence of ischemia, however troponins elevated from 0.05>0.29>1.08 overnight and repeat EKG notable for ST-T wave changes. Initially treated as PE with heparin gtt due to history of PE, elevated D-dimer, and CTA inconclusive/could not rule out a peripheral segmental or subsegmental PE due to large body habitus. Now with elevating troponin and EKG must consider ACS component. Cardiology on board. - follow up cards recs - continue telemetry and continuous pulse ox - heparin gtt for presumed PE (consider xarelto at DC as he has tolerated this in past for previous PE) - continue to trend troponin - CXR, repeat EKG given rise in troponin - Vasc doppler LE [place] ordered  - Consider V/Q scan - will make NPO pending cardiology recs   AKI - Cr 1.5 from 1.33 on admission.  Can consider holding torsemide but will follow up cardiology recs. Monitor kidney function carefully in particular if cardiology recommends cardiac cath - AM BMP - follow up cards recs  HTN,  Stable. Continues to be normotensive.  At home on Losartan 50 mg, Metoprolol XL 100 mg BID, torsemeide 20 mg PO daily - Continue  home meds   HFrEF, stable. Follows with Cardiology, Dr. Aundra Dubin.  On admission, BNP elevated to 158.1, on exam euvolemic.  At home on Metoprolol XL 100 mg BID,  Spironolactone 25 mg daily, and Demadex 20 mg.   -Discussed with cards on admission, ok to continue Metoprolol XL BID -Continue home Torsemide  -I's and O's  -daily weights  - reaching out to cards this AM given rise in troponin  T2DM.  Stable.  Last A1c in 01/2017 was 6.1%.  Reports not taking Metformin as his A1c is well controlled.  - A1c pending  - will monitor CBGs with daily labs  Sleep apnea.  Reports compliance with home Bipap with 2L at night.   -Continue Bipap nightly   Depression/Anxiety.  Stable.  At home on Lexapro 20 mg daily and Klonopin 1 mg TID.  - Continue home Lexapro - Klonopin 1 mg TID PRN for anxiety  Morbid obesity.  Weight 410 lbs.   - Heart healthy-carb modified diet - Encourage weight loss   Tobacco use.  Patient is a current everyday smoker. Smokes about 1/3 to 1 PPD.   -Nicotine 14 mg transdermal patch daily  -smoking cessation counseling  Alcohol use.  Reports occasional use however he notes he binges when he does drink.  Denies drug use. Placed on CIWA protocol - UDS pending  - monitor on CIWA   FEN/GI: HH Carb modified  Prophylaxis: Heparin gtt (treatment dose for PE), Protonix  Disposition: ?Home pending workup for dyspnea  Subjective:  Patient sitting up in chair. Endorsing central chest pain worse with  deep breathing, also endorses tightness in the back of his neck and radiation to neck. States pain is unchanged from admission.  Objective: Temp:  [97.6 F (36.4 C)-99 F (37.2 C)] 98.9 F (37.2 C) (05/14 9150) Pulse Rate:  [65-81] 73 (05/14 0632) Resp:  [13-18] 18 (05/14 0454) BP: (118-148)/(48-89) 128/55 (05/14 0454) SpO2:  [91 %-99 %] 96 % (05/14 0632) Weight:  [181.4 kg (400 lb)-186.3 kg (410 lb 11.2 oz)] 184.8 kg (407 lb 8 oz) (05/14 0454) Physical Exam: General:  NAD, sits in chair, nontoxic appearing Cardiovascular:  RRR, no m/r/g, +tenderness to palpation over left chest wall Respiratory: CTA bil, distant breath sounds throughout due to body habitus Abdomen: soft and nontender Extremities: no LE edema bil  Laboratory:  UDS with opiates (rec'd dilaudid) Troponin trend 0.05>0.29>1.08 >1.99 EKG on admission NSR without ST-T changes EKG 5/14 with ST changes in lateral leads, cards on board   Recent Labs Lab 04/12/17 0553 04/13/17 0035  WBC 11.5* 10.2  HGB 15.1 14.5  HCT 42.5 41.5  PLT 220 192    Recent Labs Lab 04/12/17 0553 04/13/17 0035  NA 138 139  K 3.6 3.6  CL 104 102  CO2 26 28  BUN 17 22*  CREATININE 1.34* 1.50*  CALCIUM 9.1 8.8*  GLUCOSE 108* 111*    Imaging/Diagnostic Tests: Ct Angio Chest Pe W Or Wo Contrast 04/12/2017 FINDINGS: Cardiovascular: Characterization of the peripheral segmental and subsegmental pulmonary arteries is markedly limited due to body habitus and associated quantum mottle. There is no central obstructing pulmonary embolism within the main, lobar or central segmental pulmonary arteries bilaterally. Thoracic aorta is normal in caliber and configuration. No aortic aneurysm or dissection. Cardiomegaly.  No pericardial effusion. Mediastinum/Nodes: No mass or enlarged lymph nodes within the mediastinum or perihilar regions. Esophagus appears normal. Trachea and central bronchi are unremarkable. Lungs/Pleura: Lungs are clear.  No pleural effusion or pneumothorax. Upper Abdomen: Limited images of the upper abdomen are unremarkable. Musculoskeletal: Mild degenerative change throughout the thoracic spine. No acute or suspicious osseous finding. Probable bilateral gynecomastia. Review of the MIP images confirms the above findings. IMPRESSION:  1. No central obstructing pulmonary embolism. More peripheral segmental and subsegmental pulmonary artery branches cannot be confidently evaluated due to patient body habitus  and associated quantum mottle. As such, small peripheral nonocclusive pulmonary embolism cannot be excluded on this exam.  2. Cardiomegaly.  No pericardial effusion.  3. No aortic aneurysm or dissection.  4. Lungs are clear. No pneumonia or pulmonary edema. No pneumothorax.  CXR 5/14 FINDINGS: Stable cardiomediastinal silhouette and central pulmonary vascular congestion is noted. No pneumothorax or pleural effusion is noted. Bony thorax is unremarkable. No consolidative process is noted. IMPRESSION: Stable central pulmonary vascular congestion.    [name], MD 04/13/2017, 9:19 AM PGY-1, [place] Intern pager: [phone], text pages welcome

## 2017-04-13 NOTE — Progress Notes (Signed)
VQ scan negative for PE, will plan for [facility] tomorrow. Continue heparin gtt. Pre cath orders placed.

## 2017-04-13 NOTE — Progress Notes (Signed)
[place] for Heparin Indication: pulmonary embolus  Allergies  Allergen Reactions  . Ace Inhibitors Anaphylaxis and Swelling  . Buspirone Other (See Comments)    dizziness    Patient Measurements: Height: 5\' 9"  (175.3 cm) Weight: (!) 407 lb 8 oz (184.8 kg) IBW/kg (Calculated) : 70.7  Ideal body weight: 69.6 kg Heparin Dosing Weight: ~115 kg  Vital Signs: Temp: 98.6 F (37 C) (05/14 0900) Temp Source: Oral (05/14 0900) BP: 112/67 (05/14 0900) Pulse Rate: 72 (05/14 0900)  Labs:  Recent Labs  04/12/17 0553  04/12/17 1813 04/13/17 0035 04/13/17 0631 04/13/17 0945  HGB 15.1  --   --  14.5  --   --   HCT 42.5  --   --  41.5  --   --   PLT 220  --   --  192  --   --   HEPARINUNFRC  --   --  0.26* 0.35  --  0.29*  CREATININE 1.34*  --   --  1.50*  --   --   TROPONINI  --   < > 0.29* 1.08* 1.99*  --   < > = values in this interval not displayed.  Estimated Creatinine Clearance: 99.1 mL/min (A) (by C-G formula based on SCr of 1.5 mg/dL (H)).  Assessment: 51 year old male with history of PE and now probable new PE in setting of elevated D-dimer and inconclusive CT Angio (unable to rule out segmental PE), awaiting dopplers and possible VQ scan.  States has not been on anticoagulants recently but did receive Xarelto with prior PE a couple of years ago.  Started on IV heparin drip 2250 units/hr with heparin level 0.35 last pm but fell to 0.29 this am. No bleeding noted. CBC ok   Goal of Therapy:  Heparin level 0.3-0.7 units/ml Monitor platelets by anticoagulation protocol: Yes   Plan:  Increase heparin drip slightly 2400 uts/hr -Daily heparin level and CBC while on therapy  Bonnita Nasuti Pharm.D. CPP, BCPS Clinical Pharmacist [phone] 04/13/2017 11:38 AM

## 2017-04-13 NOTE — Progress Notes (Signed)
Notified Dr. Aundra Dubin of most recent troponin = 3.37.

## 2017-04-13 NOTE — Consult Note (Signed)
Advanced Heart Failure Team Consult Note   Primary Physician: Dr. Buelah [name], Adam Nunnery, MD Primary Cardiologist:  Dr. Aundra [name]   Reason for Consultation: acute on chronic combined systolic and diastolic CHF.  HPI:    [name] is seen today for evaluation of acute on chronic combined systolic and diastolic CHF. at the request of Dr. Nori [name] .   [name] is a 51 year old male with a past medical history of NICM, EF 25-30% in March 2018, felt to be related to prior ETOH abuse. He also has a history of PE 03/2014 completed a years course of Xarelto, morbid obesity, OSA, and tobacco abuse.   He was admitted 11/12/15-11/14/15 with acute on chronic systolic CHF and palpitations. He wore a 30 day event monitor at discharge as he had frequent PVC's and questionable Afib on telemetry. Also with some NSVT, so he was started on Amiodaone, but at follow up had not started taking it. He had previously refused ICD and was seen inpatient by Dr. Rayann [name] who felt that his morbid obesity was a prohibitive factor.   He has followed up in the CHF clinic, last seen last month in April 2018. He had started drinking ETOH again. Volume status was stable, he is not an Entresto candidate due to angioedema with lisinopril. Weight was 411 pounds.   Admitted 04/12/17 with SOB, chest pain. D- dimer was 1.16, chest CT without central obstructing PE, however more peripheral and subsegmental pulmonary artery branches were not confidently evaluated due to his body habitus. He was started on a heparin gtt for presumed PE. Pertinent admission labs - creatinine 1.50, BUN 22. Troponin 1.08 ->1.40.   [name] drank heavily on Friday night 04/10/17, ate high salt meal of chicken wings and fries.  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 He ate spicy chicken for dinner that night. Around 3 am He  developed chest pain that radiated to his back, diaphoresis and acute SOB. He took 76m of torsemide around 3 am, he had moderate urine output but not like he usually does when he takes torsemide. Noted increased orthopnea and diffuse diaphoresis.     Review of Systems: [y] = yes, [ ]  = no   General: Weight gain [Blue.Reese]; Weight loss [ ] ; Anorexia [ ] ; Fatigue [Blue.Reese]; Fever [ ] ; Chills [ ] ; Weakness [ ]   Cardiac: Chest pain/pressure [ ] ; Resting SOB [ y]; Exertional SOB [Blue.Reese]; Orthopnea [Blue.Reese]; Pedal Edema [Blue.Reese]; Palpitations [ ] ; Syncope [ ] ; Presyncope [ ] ; Paroxysmal nocturnal dyspnea[ ]   Pulmonary: Cough [ ] ; Wheezing[ ] ; Hemoptysis[ ] ; Sputum [ ] ; Snoring [ ]   GI: Vomiting[ ] ; Dysphagia[ ] ; Melena[ ] ; Hematochezia [ ] ; Heartburn[ ] ; Abdominal pain [ ] ; Constipation [ ] ; Diarrhea [ ] ; BRBPR [ ]   GU: Hematuria[ ] ; Dysuria [ ] ; Nocturia[ ]   Vascular: Pain in legs with walking [ ] ; Pain in feet with lying flat [ ] ; Non-healing sores [ ] ; Stroke [ ] ; TIA [ ] ; Slurred speech [ ] ;  Neuro: Headaches[y ]; Vertigo[ ] ; Seizures[ ] ; Paresthesias[ ] ;Blurred vision [ ] ; Diplopia [ ] ; Vision changes [ ]   Ortho/Skin: Arthritis [ ] ; Joint pain [ ] ; Muscle pain [ ] ; Joint swelling [ ] ; Back Pain [ ] ; Rash [ ]   Psych: Depression[ ] ; Anxiety[ ]   Heme: Bleeding problems [ ] ; Clotting disorders [ ] ; Anemia [ ]   Endocrine: Diabetes [ ] ; Thyroid dysfunction[ ]   Home Medications Prior to Admission medications   Medication Sig Start Date End Date Taking? Authorizing Provider  aspirin EC 81 MG tablet Take 1 tablet (81 mg total) by mouth daily. 08/01/15  Yes [name], MD  clonazePAM (KLONOPIN) 1 MG tablet TAKE 1 TABLET BY MOUTH 3 TIMES DAILY AS NEEDED FOR ANXIETY Patient taking differently: Take 1 mg by mouth 3 (three) times daily as needed for anxiety.  01/15/17  Yes [place], Adam Nunnery, MD  escitalopram (LEXAPRO) 20 MG tablet Take 1 tablet (20 mg total) by mouth daily. 01/15/17  Yes [place], Adam Nunnery, MD  fenofibrate  54 MG tablet TAKE 1 TABLET (54 MG TOTAL) BY MOUTH DAILY. 01/27/17  Yes Shirley Friar, PA-C  KLOR-CON M20 20 MEQ tablet TAKE 1 TABLET (20 MEQ TOTAL) BY MOUTH DAILY. Patient taking differently: Take 20 mEq by mouth daily as needed (for cramps).  04/02/17  Yes [place], Adam Nunnery, MD  losartan (COZAAR) 50 MG tablet Take 1 tablet (50 mg total) by mouth at bedtime. Patient taking differently: Take 50 mg by mouth daily.  01/16/17  Yes [name], MD  magnesium oxide (MAG-OX) 400 MG tablet Take 1 tablet (400 mg total) by mouth daily. Patient taking differently: Take 400 mg by mouth daily as needed (supplement).  01/02/16  Yes Deboraha Sprang, MD  metoprolol succinate (TOPROL-XL) 100 MG 24 hr tablet Take 1 tablet (100 mg total) by mouth 2 (two) times daily. Patient taking differently: Take 150 mg by mouth 2 (two) times daily.  03/18/17  Yes [name], MD  Multiple Vitamins-Minerals (MULTIVITAMIN PO) Take 1 tablet by mouth daily.   Yes [provider]  NON FORMULARY Bipap, pressure 16/12 with 2L of O2   Yes [provider]  pantoprazole (PROTONIX) 40 MG tablet TAKE 1 TABLET (40 MG TOTAL) BY MOUTH DAILY. 09/15/16  Yes [place], Adam Nunnery, MD  spironolactone (ALDACTONE) 25 MG tablet TAKE 1 TABLET (25 MG TOTAL) BY MOUTH DAILY. 09/22/16  Yes [place], Adam Nunnery, MD  thiamine (VITAMIN B-1) 100 MG tablet Take 100 mg by mouth daily.   Yes [provider]  torsemide (DEMADEX) 20 MG tablet Take 1 tablet (20 mg total) by mouth daily. Patient taking differently: Take 10-20 mg by mouth daily as needed (for fluid).  01/16/17  Yes [name], MD  clotrimazole (LOTRIMIN) 1 % cream Apply 1 application topically 2 (two) times daily. Patient not taking: Reported on 04/12/2017 03/18/16   Landis Martins, DPM  sildenafil (REVATIO) 20 MG tablet TAKE 1 TABLET (20 MG TOTAL) BY MOUTH DAILY AS NEEDED. Patient taking differently: Take 20 mg by mouth daily as needed (ED).  07/30/16   Clegg, Amy  D, NP  torsemide (DEMADEX) 20 MG tablet Take 1 tablet (20 mg total) by mouth daily. Patient not taking: Reported on 04/12/2017 02/06/17   Bensimhon, Shaune Pascal, MD    Past Medical History: Past Medical History:  Diagnosis Date  . Alcohol abuse   . Anxiety state, unspecified   . Atrial fibrillation ([place])   . CHF (congestive heart failure) ([place])   . Chronic systolic heart failure ([place])   . Edema   . History of medication noncompliance   . Migraine   . Obesity, unspecified   . Obstructive sleep apnea   . Psychiatric disorder   . Pulmonary embolism ([place])   .  Shortness of breath     Past Surgical History: Past Surgical History:  Procedure Laterality Date  . CARDIAC CATHETERIZATION    . CARDIAC CATHETERIZATION N/A 08/13/2015   Procedure: Right/Left Heart Cath and Coronary Angiography;  Surgeon: [name], MD;  Location: [place] [facility];  Service: Cardiovascular;  Laterality: N/A;  . TESTICLE SURGERY      Family History: Family History  Problem Relation Age of Onset  . Cancer Mother        brain tumor  . Hypertension Mother   . Diabetes Father        Deceased, 46  . Heart disease Maternal Grandmother   . Hypertension Unknown        Family History  . Stroke Unknown        Family History  . Diabetes Unknown        Family History  . Diabetes Daughter     Social History: Social History   Social History  . Marital status: Married    Spouse name: N/A  . Number of children: 3  . Years of education: N/A   Occupational History  . DISABLED    Social History Main Topics  . Smoking status: Current Every Day Smoker    Packs/day: 0.50    Years: 30.00    Types: Cigarettes  . Smokeless tobacco: Never Used     Comment: 10/2014- using patches  . Alcohol use 0.0 oz/week     Comment: nothing to drink since last OV with Dr. Harl Bowie per patient  . Drug use: No  . Sexual activity: Yes   Other Topics Concern  . None   Social History Narrative   He smokes about a pack  per day and he has been smoking since he was 51 years of age.  He drinks alcohol occasionally, but he denies any illicit drug abuse.  He is presently on disability.    Lives with wife in a 2 story home.  Has 2 children.   Previously worked in [employer], last worked in 1998.   Highest level of education:  11th grade            Allergies:  Allergies  Allergen Reactions  . Ace Inhibitors Anaphylaxis and Swelling  . Buspirone Other (See Comments)    dizziness    Objective:    Vital Signs:   Temp:  [97.6 F (36.4 C)-99 F (37.2 C)] 98.9 F (37.2 C) (05/14 4287) Pulse Rate:  [65-81] 73 (05/14 0632) Resp:  [13-18] 18 (05/14 0454) BP: (118-148)/(48-89) 128/55 (05/14 0454) SpO2:  [91 %-99 %] 96 % (05/14 6811) Weight:  [400 lb (181.4 kg)-410 lb 11.2 oz (186.3 kg)] 407 lb 8 oz (184.8 kg) (05/14 0454) Last BM Date: 04/11/17  Weight change: Filed Weights   04/12/17 1100 04/12/17 1620 04/13/17 0454  Weight: (!) 400 lb (181.4 kg) (!) 410 lb 11.2 oz (186.3 kg) (!) 407 lb 8 oz (184.8 kg)    Intake/Output:   Intake/Output Summary (Last 24 hours) at 04/13/17 0858 Last data filed at 04/13/17 0856  Gross per 24 hour  Intake          1531.87 ml  Output             2475 ml  Net          -943.13 ml     Physical Exam: General:  Obese male, NAD. Resting in bed.  HEENT: normal Neck: supple. JVP to jaw . Carotids 2+ bilat; no bruits.  No lymphadenopathy or thyromegaly appreciated. Cor: PMI nondisplaced. Regular rate & rhythm. No rubs, gallops or murmurs. Lungs: Diminished throughout.  Abdomen: soft, nontender, nondistended. No hepatosplenomegaly. No bruits or masses. Good bowel sounds. Extremities: no cyanosis, clubbing, rash. 1+ pedal and pretibial edema.  Neuro: alert & orientedx3, cranial nerves grossly intact. moves all 4 extremities w/o difficulty. Affect pleasant  Telemetry: NSR   Labs: Basic Metabolic Panel:  Recent Labs Lab 04/12/17 0553 04/13/17 0035  NA 138 139  K 3.6  3.6  CL 104 102  CO2 26 28  GLUCOSE 108* 111*  BUN 17 22*  CREATININE 1.34* 1.50*  CALCIUM 9.1 8.8*    Liver Function Tests: No results for input(s): AST, ALT, ALKPHOS, BILITOT, PROT, ALBUMIN in the last 168 hours. No results for input(s): LIPASE, AMYLASE in the last 168 hours. No results for input(s): AMMONIA in the last 168 hours.  CBC:  Recent Labs Lab 04/12/17 0553 04/13/17 0035  WBC 11.5* 10.2  HGB 15.1 14.5  HCT 42.5 41.5  MCV 83.3 83.3  PLT 220 192    Cardiac Enzymes:  Recent Labs Lab 04/12/17 1322 04/12/17 1813 04/13/17 0035 04/13/17 0631  TROPONINI 0.05* 0.29* 1.08* 1.99*    BNP: BNP (last 3 results)  Recent Labs  09/04/16 0726 03/18/17 1130 04/12/17 0553  BNP 41.8 52.6 158.1*    ProBNP (last 3 results) No results for input(s): PROBNP in the last 8760 hours.   CBG:  Recent Labs Lab 04/13/17 0817  GLUCAP 118*    Coagulation Studies: No results for input(s): LABPROT, INR in the last 72 hours.  Other results: EKG: normal sinus rhythm, T wave inversion in V5-V6, consistent with prior EKG.   Imaging: Dg Chest 1 View  Result Date: 04/13/2017 CLINICAL DATA:  Shortness of breath. EXAM: CHEST 1 VIEW COMPARISON:  Radiographs of Apr 12, 2017. FINDINGS: Stable cardiomediastinal silhouette and central pulmonary vascular congestion is noted. No pneumothorax or pleural effusion is noted. Bony thorax is unremarkable. No consolidative process is noted. IMPRESSION: Stable central pulmonary vascular congestion. Electronically Signed   By: Marijo Conception, M.D.   On: 04/13/2017 07:59      Medications:     Current Medications: . aspirin EC  81 mg Oral Daily  . escitalopram  20 mg Oral Daily  . folic acid  1 mg Oral Daily  . losartan  50 mg Oral QHS  . metoprolol succinate  100 mg Oral BID  . multivitamin with minerals  1 tablet Oral Daily  . nicotine  14 mg Transdermal Daily  . pantoprazole  40 mg Oral Daily  . sodium chloride flush  3 mL  Intravenous Q12H  . spironolactone  25 mg Oral Daily  . thiamine  100 mg Oral Daily   Or  . thiamine  100 mg Intravenous Daily  . torsemide  20 mg Oral Daily     Infusions: . heparin 2,250 Units/hr (04/13/17 0448)      Assessment/Plan   1. Acute on chronic combined systolic and diastolic CHF: EF 09-81% in April 2018, NICM. ETOH vs. OSA.  - NYHA II-III - Recent ETOH abuse and dietary noncompliance.  - Weight stable from last office visit, had 3L out with po torsemide yesterday.  - Continue po torsemide 58m daily. He has intermittent compliance with this at home. We talked about medication compliance.  - Continue spiro 277mdaily - Continue losartan  - Not a candidate for Entresto with angioedema with lisinopril, he has tolerated losartan without  issues.  - Continue metoprolol XL 100 mg BID.  - Has not tolerated digoxin or Bidil in the past.  2. Acute PE: With history of PE in 2015, completed Xarelto. Will probably need lifelong anticoagulation.  - Hemodynamically stable.  - VQ scan pending.  - Continue heparin gtt - lower extremity dopplers pending.  3. Chest pain:  - Normal cors in 2016 - Cannot rule out ACS, if VQ scan is negative for PE will do LHC tomorrow.  4. AKI:  - Creatinine 1.34->1.50.  - Follow with [facility] 5. ETOH abuse - Discussion today about stopping ETOH completely.  6. Tobacco abuse.   Length of Stay: [place], NP  04/13/2017, 8:58 AM Arbutus Leas, NP-C  Pager [phone] M-F 7am-4pm.  Please contact [facility] for night-coverage after hours (4p -7a ) and weekends on [email or web address]  Patient seen with NP, agree with the above note.  1. Chest pain with some pleuritic character, diaphoresis, dyspnea: I am not totally clear what the etiology of his symptoms was. His ECG really does not look markedly different from 10/17.  His D dimer was elevated but peripheral venous dopplers showed no DVT and CTA chest showed no PE (though study not technically  adequate to comment on a more peripheral PE).  He was diagnosed with possible PE apparently at [facility] in 5/15, but from looking at the notes, I am not sure that it was actually a certain diagnosis.  On exam, he does not appear particularly volume overloaded today and his weight is lower than at last appointment.  Troponin is up to 1.99.  He had a cath in 2016 without significant coronary disease but he continues to smoke. Finally, cannot rule out acute pericarditis with his pleuritic symptoms.  - Will get V/Q scan.  If this does not show a PE, I think he will need cardiac cath with rise in troponin.  We discussed this, will arrange left and right heart cath (use right groin for LHC given prior severe vasospasm with cath through arm) if V/Q does not show a PE.  - Continue heparin gtt for now.  - Will get echo and ESR given pleuritic symptoms, ?pericardial effusion/pericarditis.  - As above, he does not seem significantly volume overloaded today.  He diuresed well with torsemide 20 mg daily, will continue.  2. Chronic systolic CHF: As above, he looks fairly optimized (though difficult exam).  His weight is lower.  Continue current HF meds.   3. ETOH abuse: Needs to stop, discussed.  4. Smoking: Needs to stop, discussed.   [name] 04/13/2017 1:01 PM

## 2017-04-13 NOTE — Progress Notes (Signed)
During AM med pass, educated pt to only take "sips with meds;"  However, pt continued to intake apprx 180mL, even though he is NPO.

## 2017-04-14 ENCOUNTER — Inpatient Hospital Stay (HOSPITAL_COMMUNITY): Payer: Medicare Other

## 2017-04-14 ENCOUNTER — Inpatient Hospital Stay (HOSPITAL_COMMUNITY)
Admission: EM | Disposition: A | Discharge status: Home / Self Care | Payer: Self-pay | Source: Home / Self Care | Attending: Family Medicine

## 2017-04-14 DIAGNOSIS — E785 Hyperlipidemia, unspecified: Secondary | ICD-10-CM

## 2017-04-14 DIAGNOSIS — E0821 Diabetes mellitus due to underlying condition with diabetic nephropathy: Secondary | ICD-10-CM

## 2017-04-14 DIAGNOSIS — I5043 Acute on chronic combined systolic (congestive) and diastolic (congestive) heart failure: Secondary | ICD-10-CM

## 2017-04-14 DIAGNOSIS — I5022 Chronic systolic (congestive) heart failure: Secondary | ICD-10-CM

## 2017-04-14 DIAGNOSIS — I2699 Other pulmonary embolism without acute cor pulmonale: Principal | ICD-10-CM

## 2017-04-14 DIAGNOSIS — R079 Chest pain, unspecified: Secondary | ICD-10-CM

## 2017-04-14 HISTORY — PX: RIGHT/LEFT HEART CATH AND CORONARY ANGIOGRAPHY: CATH118266

## 2017-04-14 LAB — GLUCOSE, CAPILLARY: Glucose-Capillary: 107 mg/dL — ABNORMAL HIGH (ref 65–99)

## 2017-04-14 LAB — CBC
HCT: 40.5 % (ref 39.0–52.0)
HEMOGLOBIN: 14.3 g/dL (ref 13.0–17.0)
MCH: 29.5 pg (ref 26.0–34.0)
MCHC: 35.3 g/dL (ref 30.0–36.0)
MCV: 83.5 fL (ref 78.0–100.0)
PLATELETS: 214 10*3/uL (ref 150–400)
RBC: 4.85 MIL/uL (ref 4.22–5.81)
RDW: 14.4 % (ref 11.5–15.5)
WBC: 9.3 10*3/uL (ref 4.0–10.5)

## 2017-04-14 LAB — BASIC METABOLIC PANEL
ANION GAP: 9 (ref 5–15)
BUN: 24 mg/dL — ABNORMAL HIGH (ref 6–20)
CALCIUM: 8.7 mg/dL — AB (ref 8.9–10.3)
CO2: 28 mmol/L (ref 22–32)
Chloride: 103 mmol/L (ref 101–111)
Creatinine, Ser: 1.32 mg/dL — ABNORMAL HIGH (ref 0.61–1.24)
Glucose, Bld: 112 mg/dL — ABNORMAL HIGH (ref 65–99)
POTASSIUM: 3.4 mmol/L — AB (ref 3.5–5.1)
Sodium: 140 mmol/L (ref 135–145)

## 2017-04-14 LAB — POCT I-STAT 3, ART BLOOD GAS (G3+)
ACID-BASE EXCESS: 2 mmol/L (ref 0.0–2.0)
BICARBONATE: 25.1 mmol/L (ref 20.0–28.0)
BICARBONATE: 27.1 mmol/L (ref 20.0–28.0)
O2 SAT: 65 %
O2 Saturation: 68 %
PCO2 ART: 40.4 mmHg (ref 32.0–48.0)
PH ART: 7.402 (ref 7.350–7.450)
PH ART: 7.409 (ref 7.350–7.450)
TCO2: 26 mmol/L (ref 0–100)
TCO2: 28 mmol/L (ref 0–100)
pCO2 arterial: 42.8 mmHg (ref 32.0–48.0)
pO2, Arterial: 34 mmHg — CL (ref 83.0–108.0)
pO2, Arterial: 35 mmHg — CL (ref 83.0–108.0)

## 2017-04-14 LAB — HEPARIN LEVEL (UNFRACTIONATED): HEPARIN UNFRACTIONATED: 0.28 [IU]/mL — AB (ref 0.30–0.70)

## 2017-04-14 LAB — POCT ACTIVATED CLOTTING TIME: Activated Clotting Time: 125 seconds

## 2017-04-14 SURGERY — RIGHT/LEFT HEART CATH AND CORONARY ANGIOGRAPHY
Anesthesia: LOCAL

## 2017-04-14 MED ORDER — MIDAZOLAM HCL 2 MG/2ML IJ SOLN
INTRAMUSCULAR | Status: DC | PRN
  Administered 2017-04-14 (×4): 1 mg via INTRAVENOUS

## 2017-04-14 MED ORDER — MIDAZOLAM HCL 2 MG/2ML IJ SOLN
INTRAMUSCULAR | Status: AC
  Filled 2017-04-14: qty 2

## 2017-04-14 MED ORDER — SODIUM CHLORIDE 0.9% FLUSH
3.0000 mL | Freq: Two times a day (BID) | INTRAVENOUS | Status: DC
  Administered 2017-04-14 – 2017-04-15 (×3): 3 mL via INTRAVENOUS

## 2017-04-14 MED ORDER — SODIUM CHLORIDE 0.9 % IV SOLN: 250.0000 mL | INTRAVENOUS | Status: DC | PRN

## 2017-04-14 MED ORDER — ACETAMINOPHEN 325 MG PO TABS: 650.0000 mg | ORAL_TABLET | ORAL | Status: DC | PRN

## 2017-04-14 MED ORDER — ROSUVASTATIN CALCIUM 10 MG PO TABS
5.0000 mg | ORAL_TABLET | Freq: Every day | ORAL | Status: DC
  Administered 2017-04-14 – 2017-04-15 (×2): 5 mg via ORAL
  Filled 2017-04-14 (×2): qty 1

## 2017-04-14 MED ORDER — HEPARIN SODIUM (PORCINE) 5000 UNIT/ML IJ SOLN
5000.0000 [IU] | Freq: Three times a day (TID) | INTRAMUSCULAR | Status: DC
  Administered 2017-04-14: 5000 [IU] via SUBCUTANEOUS
  Filled 2017-04-14 (×3): qty 1

## 2017-04-14 MED ORDER — POTASSIUM CHLORIDE CRYS ER 20 MEQ PO TBCR
40.0000 meq | EXTENDED_RELEASE_TABLET | Freq: Once | ORAL | Status: AC
  Administered 2017-04-14: 40 meq via ORAL
  Filled 2017-04-14: qty 2

## 2017-04-14 MED ORDER — NITROGLYCERIN 1 MG/10 ML FOR IR/CATH LAB
INTRA_ARTERIAL | Status: AC
  Filled 2017-04-14: qty 10

## 2017-04-14 MED ORDER — SODIUM CHLORIDE 0.9% FLUSH: 3.0000 mL | INTRAVENOUS | Status: DC | PRN

## 2017-04-14 MED ORDER — IOPAMIDOL (ISOVUE-370) INJECTION 76%
INTRAVENOUS | Status: DC | PRN
  Administered 2017-04-14: 90 mL via INTRA_ARTERIAL

## 2017-04-14 MED ORDER — LIDOCAINE HCL 1 % IJ SOLN
INTRAMUSCULAR | Status: AC
  Filled 2017-04-14: qty 20

## 2017-04-14 MED ORDER — FENTANYL CITRATE (PF) 100 MCG/2ML IJ SOLN
INTRAMUSCULAR | Status: AC
  Filled 2017-04-14: qty 2

## 2017-04-14 MED ORDER — LIDOCAINE HCL (PF) 1 % IJ SOLN
INTRAMUSCULAR | Status: DC | PRN
  Administered 2017-04-14: 2 mL via INTRADERMAL
  Administered 2017-04-14: 15 mL via INTRADERMAL

## 2017-04-14 MED ORDER — HEPARIN (PORCINE) IN NACL 2-0.9 UNIT/ML-% IJ SOLN
INTRAMUSCULAR | Status: AC
  Filled 2017-04-14: qty 1000

## 2017-04-14 MED ORDER — HEPARIN (PORCINE) IN NACL 2-0.9 UNIT/ML-% IJ SOLN
INTRAMUSCULAR | Status: AC | PRN
  Administered 2017-04-14: 1000 mL

## 2017-04-14 MED ORDER — FENTANYL CITRATE (PF) 100 MCG/2ML IJ SOLN
INTRAMUSCULAR | Status: DC | PRN
  Administered 2017-04-14 (×4): 25 ug via INTRAVENOUS

## 2017-04-14 MED ORDER — ONDANSETRON HCL 4 MG/2ML IJ SOLN: 4.0000 mg | Freq: Four times a day (QID) | INTRAMUSCULAR | Status: DC | PRN

## 2017-04-14 SURGICAL SUPPLY — 11 items
CATH BALLN WEDGE 5F 110CM (CATHETERS) ×2 IMPLANT
CATH INFINITI 5FR MULTPACK ANG (CATHETERS) ×2 IMPLANT
GUIDEWIRE 3MM J TIP .035 145 (WIRE) ×2 IMPLANT
HOVERMATT SINGLE USE (MISCELLANEOUS) ×2 IMPLANT
KIT HEART LEFT (KITS) ×2 IMPLANT
PACK CARDIAC CATHETERIZATION (CUSTOM PROCEDURE TRAY) ×2 IMPLANT
SHEATH GLIDE SLENDER 4/5FR (SHEATH) ×2 IMPLANT
SHEATH PINNACLE 5F 10CM (SHEATH) ×2 IMPLANT
SYR MEDRAD MARK V 150ML (SYRINGE) ×2 IMPLANT
TRANSDUCER W/STOPCOCK (MISCELLANEOUS) ×2 IMPLANT
TUBING CIL FLEX 10 FLL-RA (TUBING) ×2 IMPLANT

## 2017-04-14 NOTE — Progress Notes (Signed)
Pt is alert and oriented with daughter with wife and spouse at bedside walking in room pt started to feel tightening and in back and then chest. Perform a EKG.  And  Transportation arrived to go to cath Lab. Stated to he got upset with spouse and would usually take a clonopin  When he gets anxious but wanted to wait until cath Lab to get something to relax.

## 2017-04-14 NOTE — Discharge Summary (Signed)
[facility] Discharge Summary  Patient name: [name] Medical record number: [id number] Date of birth: [birth year] Age: 51 y.o. Gender: male Date of Admission: 04/12/2017  Date of Discharge: 04/15/2017 Admitting Physician: Dickie La, MD  Primary Care Provider: Alycia Rossetti, MD Consultants: cardiology  Indication for Hospitalization: chest pain and dyspnea  Discharge Diagnoses/Problem List:  has Morbid obesity ([place]); Anxiety state; Essential hypertension; ATRIAL FIBRILLATION, PAROXYSMAL; Systolic CHF, chronic ([place]); Acute on chronic combined systolic and diastolic CHF (congestive heart failure) ([place]); Esophageal reflux; OSA (obstructive sleep apnea) with BiPap and oxygen; Major depression; ED (erectile dysfunction); Insomnia; Migraine; Bilateral leg edema; SOB (shortness of breath); Tobacco user; Diabetes mellitus ([place]); Paresthesia and pain of extremity; Back pain; Pain in joint, shoulder region; Nonischemic cardiomyopathy ([place]); Gout attack; Alcohol abuse; NSVT (nonsustained ventricular tachycardia) ([place]); Hyperlipidemia; Non compliance w medication regimen; Acute pulmonary embolism ([place]); Chronic systolic CHF (congestive heart failure) ([place]); Chronic anticoagulation, secondary to PE 03/2014; Osteoarthritis of both knees; Palpitation; Palpitations; Paroxysmal atrial fibrillation ([place]); Morbid obesity due to excess calories ([place]); Osteoarthritis of toe joint; Fatigue; Hereditary and idiopathic peripheral neuropathy; Panic attack as reaction to stress; Chest pressure; PE (pulmonary thromboembolism) ([place]); Elevated serum creatinine; Non-ST elevation (NSTEMI) myocardial infarction ([place]); and Troponin I above reference range on his problem list.   Disposition: Home   Discharge Condition: Guarded   Discharge Exam:  General: NAD, sits next to the bed eating breakfast, nontoxic Cardiovascular:  RRR, no m/r/g Respiratory: CTA bil, distant breath sounds throughout  due to body habitus Abdomen: soft and nontender Extremities: no LE edema bil  Brief Hospital Course:  [name] a 50 y.o.malepresenting with chest tightness radiating to neck. PMH is significant for HTN, HFrEF (Last Echo 01/2017 with EF 25-30% and diffuse hypokinesis.), T2DM (Last A1c in 01/2017 was 6.1%), sleep apnea, tobacco use, alcohol use, history of PE, panic attacks and depression.   For the patient's presentation with chest tightness and dyspnea, initially PE was at the top of the differential. Patient had a history of PE in the past and CTA was inconclusive due to body habitus and could not rule out subsegemental PE. (Was negative for large central PE.) Patient was started initially on heparin gtt for presumed PE. Troponin was collected and found to be elevated, so was trended. Troponins trended up to 3.37 and there was concern that perhaps ACS was the primary cause of chest tightness and dyspnea.  Cardiology was consulted and patient was taken for cardiac cath which showed no significant CAD, mildly elevated R heart filling pressure and low but not markedly low cardiac output.  Cardiology recommended continuing torsemide, spiro, losartan. Cardiology also recommended decreasing metoprolol to 50 mg once nightly as multiple doses of metoprolol were causing the patient anxiety.   There was concern for pericarditis as cause for the patient's presentation and so cardiac echo was ordered, which showed severely dilated LV with EF 15%. Moderate diastolic dysfunction. Normal RV size with mildly decreased systolic function. No significant valvular abnormalities. This was a change from patient's previous ECHO which showed EF of 25-30%. Discussed with cardiology, Dr. Aundra Dubin who stated that this heart function was within expected range and there was no further needed for hospitalization. Patient was discharged with plans to continue outpatient follow up with cardiology.   Issues for Follow Up:   1. At follow up, consider adding buspar to current lexapro regimen for anxiety to help titrate down benzo 2. DC with nicotine patch  Significant Procedures:  ECHO 03/2017  - Left ventricle: The cavity size was severely dilated. Wall   thickness was increased in a pattern of mild LVH. The estimated   ejection fraction was 15%. Diffuse hypokinesis. Features are   consistent with a pseudonormal left ventricular filling pattern,   with concomitant abnormal relaxation and increased filling   pressure (grade 2 diastolic dysfunction). - Aortic valve: There was no stenosis. There was trivial   regurgitation. - Mitral valve: There was trivial regurgitation. - Left atrium: The atrium was mildly to moderately dilated. - Right ventricle: Poorly visualized. The cavity size was normal.   Systolic function was mildly reduced. - Right atrium: The atrium was mildly dilated. - Pulmonary arteries: No complete TR doppler jet so unable to   estimate PA systolic pressure. - Systemic veins: IVC measured 2.3 cm with > 50% respirophasic   variation, suggesting RA pressure 8 mmHg. - Pericardium, extracardiac: There was no pericardial effusion.  Significant Labs and Imaging:   Recent Labs Lab 04/14/17 0403 04/15/17 0525  WBC 9.3 7.4  HGB 14.3 14.7  HCT 40.5 41.7  PLT 214 205    Recent Labs Lab 04/14/17 0403 04/15/17 0525  NA 140 138  K 3.4* 3.9  CL 103 106  CO2 28 24  GLUCOSE 112* 118*  BUN 24* 18  CREATININE 1.32* 1.12  CALCIUM 8.7* 8.9    Results/Tests Pending at Time of Discharge: None  Discharge Medications:  Allergies as of 04/15/2017      Reactions   Ace Inhibitors Anaphylaxis, Swelling   Buspirone Other (See Comments)   dizziness      Medication List    STOP taking these medications   clotrimazole 1 % cream Commonly known as:  LOTRIMIN     TAKE these medications   aspirin EC 81 MG tablet Take 1 tablet (81 mg total) by mouth daily.   clonazePAM 1 MG tablet Commonly  known as:  KLONOPIN TAKE 1 TABLET BY MOUTH 3 TIMES DAILY AS NEEDED FOR ANXIETY What changed:  how much to take  how to take this  when to take this  reasons to take this  additional instructions   escitalopram 20 MG tablet Commonly known as:  LEXAPRO Take 1 tablet (20 mg total) by mouth daily.   fenofibrate 54 MG tablet TAKE 1 TABLET (54 MG TOTAL) BY MOUTH DAILY.   KLOR-CON M20 20 MEQ tablet Generic drug:  potassium chloride SA TAKE 1 TABLET (20 MEQ TOTAL) BY MOUTH DAILY. What changed:  when to take this  reasons to take this   losartan 50 MG tablet Commonly known as:  COZAAR Take 1 tablet (50 mg total) by mouth at bedtime. What changed:  when to take this   magnesium oxide 400 MG tablet Commonly known as:  MAG-OX Take 1 tablet (400 mg total) by mouth daily. What changed:  when to take this  reasons to take this   metoprolol succinate 100 MG 24 hr tablet Commonly known as:  TOPROL-XL Take 100 mg in the AM and 50 mg in the PM What changed:  how much to take  how to take this  when to take this  additional instructions   MULTIVITAMIN PO Take 1 tablet by mouth daily.   NON FORMULARY Bipap, pressure 16/12 with 2L of O2   pantoprazole 40 MG tablet Commonly known as:  PROTONIX TAKE 1 TABLET (40 MG TOTAL) BY MOUTH DAILY.   rosuvastatin 5 MG tablet Commonly known as:  CRESTOR Take 1 tablet (  5 mg total) by mouth daily at 6 PM.   sildenafil 20 MG tablet Commonly known as:  REVATIO TAKE 1 TABLET (20 MG TOTAL) BY MOUTH DAILY AS NEEDED. What changed:  reasons to take this   spironolactone 25 MG tablet Commonly known as:  ALDACTONE TAKE 1 TABLET (25 MG TOTAL) BY MOUTH DAILY.   thiamine 100 MG tablet Commonly known as:  VITAMIN B-1 Take 100 mg by mouth daily.   torsemide 20 MG tablet Commonly known as:  DEMADEX Take 1 tablet (20 mg total) by mouth daily. What changed:  how much to take  when to take this  reasons to take this  Another  medication with the same name was removed. Continue taking this medication, and follow the directions you see here.       Discharge Instructions: Please refer to Patient Instructions section of EMR for full details.  Patient was counseled important signs and symptoms that should prompt return to medical care, changes in medications, dietary instructions, activity restrictions, and follow up appointments.   Follow-Up Appointments: Follow-up Information    [facility] Follow up on 04/23/2017.   Specialty:  Cardiology Why:  at 12:30 pm for hospital follow up with Dr. Claris Gladden NP.  Garage code [id number].  Contact information: [address] [id number] [place]ice G[place] [address] [phone]       Alycia Rossetti, MD. Schedule an appointment as soon as possible for a visit in 1 week(s).   Specialty:  Family Medicine Contact information: [address] [place] [address] [phone]           Tonette Bihari, MD 04/20/2017, 8:10 AM PGY-2, [place]

## 2017-04-14 NOTE — H&P (View-Only) (Signed)
Advanced Heart Failure Rounding Note  PCP: Dr. Buelah Manis, Modena Nunnery, MD Primary Cardiologist: Dr. Aundra Dubin   Subjective:    Feels well today, lying mostly flat without orthopnea. Denies chest pain and SOB.  Troponin peaked at 3.37.   For R/L heart cath today.    Objective:   Weight Range: (!) 405 lb 4.8 oz (183.8 kg) Body mass index is 59.85 kg/m.   Vital Signs:   Temp:  [97.4 F (36.3 C)-98.7 F (37.1 C)] 97.8 F (36.6 C) (05/15 0655) Pulse Rate:  [59-73] 59 (05/15 0655) Resp:  [18-20] 20 (05/15 0655) BP: (109-124)/(52-76) 109/52 (05/15 0655) SpO2:  [96 %-100 %] 100 % (05/15 0655) Weight:  [405 lb 4.8 oz (183.8 kg)] 405 lb 4.8 oz (183.8 kg) (05/15 0655) Last BM Date: 04/12/17  Weight change: Filed Weights   04/12/17 1620 04/13/17 0454 04/14/17 0655  Weight: (!) 410 lb 11.2 oz (186.3 kg) (!) 407 lb 8 oz (184.8 kg) (!) 405 lb 4.8 oz (183.8 kg)    Intake/Output:   Intake/Output Summary (Last 24 hours) at 04/14/17 0734 Last data filed at 04/14/17 0700  Gross per 24 hour  Intake          1157.93 ml  Output             2900 ml  Net         -1742.07 ml     Physical Exam: General:  Obese male, NAD. Lying in bed.  HEENT: normal Neck: supple. JVP 6 cm.  . Carotids 2+ bilat; no bruits. No lymphadenopathy or thyromegaly appreciated. Cor: PMI nondisplaced. Regular rate & rhythm. No rubs, gallops or murmurs. Lungs: clear in upper lobes, diminished in bases.  Abdomen: soft, nontender, nondistended. No hepatosplenomegaly. No bruits or masses. Good bowel sounds. Extremities: no cyanosis, clubbing, rash, edema Neuro: alert & orientedx3, cranial nerves grossly intact. moves all 4 extremities w/o difficulty. Affect pleasant   Telemetry: NSR  Labs: CBC  Recent Labs  04/13/17 0035 04/14/17 0403  WBC 10.2 9.3  HGB 14.5 14.3  HCT 41.5 40.5  MCV 83.3 83.5  PLT 192 349   Basic Metabolic Panel  Recent Labs  04/13/17 0035 04/14/17 0403  NA 139 140  K 3.6 3.4*  CL  102 103  CO2 28 28  GLUCOSE 111* 112*  BUN 22* 24*  CREATININE 1.50* 1.32*  CALCIUM 8.8* 8.7*   Cardiac Enzymes  Recent Labs  04/13/17 0631 04/13/17 1229 04/13/17 1839  TROPONINI 1.99* 3.37* 3.28*    BNP: BNP (last 3 results)  Recent Labs  09/04/16 0726 03/18/17 1130 04/12/17 0553  BNP 41.8 52.6 158.1*    ProBNP (last 3 results) No results for input(s): PROBNP in the last 8760 hours.   D-Dimer  Recent Labs  04/12/17 0830  DDIMER 1.16*   Hemoglobin A1C  Recent Labs  04/12/17 1521  HGBA1C 6.1*   Fasting Lipid Panel No results for input(s): CHOL, HDL, LDLCALC, TRIG, CHOLHDL, LDLDIRECT in the last 72 hours. Thyroid Function Tests No results for input(s): TSH, T4TOTAL, T3FREE, THYROIDAB in the last 72 hours.  Invalid input(s): FREET3  Other results: EXAM: NUCLEAR MEDICINE VENTILATION - PERFUSION LUNG SCAN IMPRESSION: Negative VQ scan. No mismatched perfusion defects to suggest acute pulmonary embolism identified.     Imaging/Studies:  Dg Chest 1 View  Result Date: 04/13/2017 CLINICAL DATA:  Shortness of breath. EXAM: CHEST 1 VIEW COMPARISON:  Radiographs of Apr 12, 2017. FINDINGS: Stable cardiomediastinal silhouette and central pulmonary vascular congestion  is noted. No pneumothorax or pleural effusion is noted. Bony thorax is unremarkable. No consolidative process is noted. IMPRESSION: Stable central pulmonary vascular congestion. Electronically Signed   By: Marijo Conception, M.D.   On: 04/13/2017 07:59   Nm Pulmonary Perf And Vent  Result Date: 04/13/2017 CLINICAL DATA:  Initial evaluation for anterior chest pain with reading for 3 days. EXAM: NUCLEAR MEDICINE VENTILATION - PERFUSION LUNG SCAN TECHNIQUE: Ventilation images were obtained in multiple projections using inhaled aerosol Tc-35mDTPA. Perfusion images were obtained in multiple projections after intravenous injection of Tc-922mAA. RADIOPHARMACEUTICALS:  31.5 mCi Technetium-9962mPA  aerosol inhalation and 4.3 mCi Technetium-33m33m IV COMPARISON:  Comparison made with chest radiograph from earlier the same day. FINDINGS: Ventilation: No focal ventilation defect. Perfusion: No wedge shaped peripheral perfusion defects to suggest acute pulmonary embolism. Please note study is somewhat limited due to patient body habitus. Additionally, patient could only be scan with a single head camera due to large body size. IMPRESSION: Negative VQ scan. No mismatched perfusion defects to suggest acute pulmonary embolism identified. Electronically Signed   By: BenjJeannine Boga.   On: 04/13/2017 16:38       Medications:     Scheduled Medications: . aspirin EC  81 mg Oral Daily  . escitalopram  20 mg Oral Daily  . folic acid  1 mg Oral Daily  . losartan  50 mg Oral QHS  . metoprolol succinate  100 mg Oral BID  . multivitamin with minerals  1 tablet Oral Daily  . nicotine  14 mg Transdermal Daily  . pantoprazole  40 mg Oral Daily  . sodium chloride flush  3 mL Intravenous Q12H  . sodium chloride flush  3 mL Intravenous Q12H  . spironolactone  25 mg Oral Daily  . thiamine  100 mg Oral Daily   Or  . thiamine  100 mg Intravenous Daily  . torsemide  20 mg Oral Daily     Infusions: . sodium chloride    . sodium chloride 10 mL/hr at 04/14/17 0645  . heparin 2,400 Units/hr (04/13/17 2204)     PRN Medications:  sodium chloride, acetaminophen, clonazePAM, LORazepam **OR** LORazepam, nitroGLYCERIN, sodium chloride flush, technetium TC 26M diethylenetriame-pentaacetic acid   Assessment/Plan   1. Acute on chronic combined systolic and diastolic CHF: EF 25-376-28%April 2018, NICM. ETOH vs. OSA.  - NYHA II-III - Recent ETOH abuse and dietary noncompliance.  - Weight down 2 pounds.  -3.3L - Continue po torsemide 20mg64mly.  - Will give 40mEq29m today.  - Continue spiro 25mg d43m - Continue losartan  - Not a candidate for Entresto with angioedema with lisinopril, he has  tolerated losartan without issues.  - Continue metoprolol XL 100 mg BID.  - Has not tolerated digoxin or Bidil in the past.  2. NSTEMI: troponin 1.99->3.37->3.28 - continue heparin gtt - For R and L heart cath today.  - Continue ASA - LDL 89 in Feb. 2018  - ASCVD risk 11.2%, start Crestor 10mg  -42mst pain free 3. AKI  - creatinine improved today.   Length of Stay: 1  Erin Madison15/2018, 7:34 AM  Advanced Heart Failure Team Pager [phone] - 4p)  Please contact [facility] for night-coverage after hours (4p -7a ) and weekends on [email or web address]  Patient seen with NP, agree with the above note.  1. Chest pain with some pleuritic character, diaphoresis, dyspnea: I am not totally clear what the  etiology of his symptoms was. His ECG really does not look markedly different from 10/17.  His D dimer was elevated but peripheral venous dopplers showed no DVT, CTA chest showed no PE (though study not technically adequate to comment on a more peripheral PE), and V/Q scan was negative => doubt PE.  On exam, he has not appeared markedly volume overloaded but has diuresed some with po torsemide and is breathing better.  Troponin peaked at 3.37.  He had a cath in 2016 without significant coronary disease but he continues to smoke, so NSTEMI is definitely a concern. Finally, cannot rule out acute pericarditis with his pleuritic symptoms.  - Will plan R/LHC today.  Will use brachial access for RHC and femoral for left (had severe radial vasospasm with previous cath).  Discussed risks/benefits of procedure and he agrees to it.  - Continue heparin gtt for now.  - Will get echo given pleuritic symptoms, ?pericardial effusion/pericarditis.  ESR mildly elevated.   2. ?Acute on chronic systolic CHF: As above, he looks fairly optimized (though difficult exam).  His weight is lower.  Continue po torsemide but will assess filling pressures on RHC.  3. ETOH abuse: Needs to stop, discussed.  4.  Smoking: Needs to stop, discussed. Wearing nicotine patch.   [name] 04/14/2017 7:58 AM

## 2017-04-14 NOTE — Progress Notes (Signed)
Pt is alert and oriented with no complications with Cath, [place] Md Ordered to Stop Heparin.

## 2017-04-14 NOTE — Progress Notes (Signed)
RT placed patient on CPAP with 2L O2 bled into circuit. Patient tolerating well at this time. RT will monitor as needed. 

## 2017-04-14 NOTE — Interval H&P Note (Signed)
History and Physical Interval Note:  04/14/2017 1:36 PM  [name]  has presented today for surgery, with the diagnosis of chf, elevated troponin  The various methods of treatment have been discussed with the patient and family. After consideration of risks, benefits and other options for treatment, the patient has consented to  Procedure(s): Right/Left Heart Cath and Coronary Angiography (N/A) as a surgical intervention .  The patient's history has been reviewed, patient examined, no change in status, stable for surgery.  I have reviewed the patient's chart and labs.  Questions were answered to the patient's satisfaction.     [place] [employer]

## 2017-04-14 NOTE — Progress Notes (Signed)
Advanced Heart Failure Rounding Note  PCP: Dr. Buelah Manis, Modena Nunnery, MD Primary Cardiologist: Dr. Aundra Dubin   Subjective:    Feels well today, lying mostly flat without orthopnea. Denies chest pain and SOB.  Troponin peaked at 3.37.   For R/L heart cath today.    Objective:   Weight Range: (!) 405 lb 4.8 oz (183.8 kg) Body mass index is 59.85 kg/m.   Vital Signs:   Temp:  [97.4 F (36.3 C)-98.7 F (37.1 C)] 97.8 F (36.6 C) (05/15 0655) Pulse Rate:  [59-73] 59 (05/15 0655) Resp:  [18-20] 20 (05/15 0655) BP: (109-124)/(52-76) 109/52 (05/15 0655) SpO2:  [96 %-100 %] 100 % (05/15 0655) Weight:  [405 lb 4.8 oz (183.8 kg)] 405 lb 4.8 oz (183.8 kg) (05/15 0655) Last BM Date: 04/12/17  Weight change: Filed Weights   04/12/17 1620 04/13/17 0454 04/14/17 0655  Weight: (!) 410 lb 11.2 oz (186.3 kg) (!) 407 lb 8 oz (184.8 kg) (!) 405 lb 4.8 oz (183.8 kg)    Intake/Output:   Intake/Output Summary (Last 24 hours) at 04/14/17 0734 Last data filed at 04/14/17 0700  Gross per 24 hour  Intake          1157.93 ml  Output             2900 ml  Net         -1742.07 ml     Physical Exam: General:  Obese male, NAD. Lying in bed.  HEENT: normal Neck: supple. JVP 6 cm.  . Carotids 2+ bilat; no bruits. No lymphadenopathy or thyromegaly appreciated. Cor: PMI nondisplaced. Regular rate & rhythm. No rubs, gallops or murmurs. Lungs: clear in upper lobes, diminished in bases.  Abdomen: soft, nontender, nondistended. No hepatosplenomegaly. No bruits or masses. Good bowel sounds. Extremities: no cyanosis, clubbing, rash, edema Neuro: alert & orientedx3, cranial nerves grossly intact. moves all 4 extremities w/o difficulty. Affect pleasant   Telemetry: NSR  Labs: CBC  Recent Labs  04/13/17 0035 04/14/17 0403  WBC 10.2 9.3  HGB 14.5 14.3  HCT 41.5 40.5  MCV 83.3 83.5  PLT 192 349   Basic Metabolic Panel  Recent Labs  04/13/17 0035 04/14/17 0403  NA 139 140  K 3.6 3.4*  CL  102 103  CO2 28 28  GLUCOSE 111* 112*  BUN 22* 24*  CREATININE 1.50* 1.32*  CALCIUM 8.8* 8.7*   Cardiac Enzymes  Recent Labs  04/13/17 0631 04/13/17 1229 04/13/17 1839  TROPONINI 1.99* 3.37* 3.28*    BNP: BNP (last 3 results)  Recent Labs  09/04/16 0726 03/18/17 1130 04/12/17 0553  BNP 41.8 52.6 158.1*    ProBNP (last 3 results) No results for input(s): PROBNP in the last 8760 hours.   D-Dimer  Recent Labs  04/12/17 0830  DDIMER 1.16*   Hemoglobin A1C  Recent Labs  04/12/17 1521  HGBA1C 6.1*   Fasting Lipid Panel No results for input(s): CHOL, HDL, LDLCALC, TRIG, CHOLHDL, LDLDIRECT in the last 72 hours. Thyroid Function Tests No results for input(s): TSH, T4TOTAL, T3FREE, THYROIDAB in the last 72 hours.  Invalid input(s): FREET3  Other results: EXAM: NUCLEAR MEDICINE VENTILATION - PERFUSION LUNG SCAN IMPRESSION: Negative VQ scan. No mismatched perfusion defects to suggest acute pulmonary embolism identified.     Imaging/Studies:  Dg Chest 1 View  Result Date: 04/13/2017 CLINICAL DATA:  Shortness of breath. EXAM: CHEST 1 VIEW COMPARISON:  Radiographs of Apr 12, 2017. FINDINGS: Stable cardiomediastinal silhouette and central pulmonary vascular congestion  is noted. No pneumothorax or pleural effusion is noted. Bony thorax is unremarkable. No consolidative process is noted. IMPRESSION: Stable central pulmonary vascular congestion. Electronically Signed   By: Marijo Conception, M.D.   On: 04/13/2017 07:59   Nm Pulmonary Perf And Vent  Result Date: 04/13/2017 CLINICAL DATA:  Initial evaluation for anterior chest pain with reading for 3 days. EXAM: NUCLEAR MEDICINE VENTILATION - PERFUSION LUNG SCAN TECHNIQUE: Ventilation images were obtained in multiple projections using inhaled aerosol Tc-35mDTPA. Perfusion images were obtained in multiple projections after intravenous injection of Tc-922mAA. RADIOPHARMACEUTICALS:  31.5 mCi Technetium-9962mPA  aerosol inhalation and 4.3 mCi Technetium-33m33m IV COMPARISON:  Comparison made with chest radiograph from earlier the same day. FINDINGS: Ventilation: No focal ventilation defect. Perfusion: No wedge shaped peripheral perfusion defects to suggest acute pulmonary embolism. Please note study is somewhat limited due to patient body habitus. Additionally, patient could only be scan with a single head camera due to large body size. IMPRESSION: Negative VQ scan. No mismatched perfusion defects to suggest acute pulmonary embolism identified. Electronically Signed   By: BenjJeannine Boga.   On: 04/13/2017 16:38       Medications:     Scheduled Medications: . aspirin EC  81 mg Oral Daily  . escitalopram  20 mg Oral Daily  . folic acid  1 mg Oral Daily  . losartan  50 mg Oral QHS  . metoprolol succinate  100 mg Oral BID  . multivitamin with minerals  1 tablet Oral Daily  . nicotine  14 mg Transdermal Daily  . pantoprazole  40 mg Oral Daily  . sodium chloride flush  3 mL Intravenous Q12H  . sodium chloride flush  3 mL Intravenous Q12H  . spironolactone  25 mg Oral Daily  . thiamine  100 mg Oral Daily   Or  . thiamine  100 mg Intravenous Daily  . torsemide  20 mg Oral Daily     Infusions: . sodium chloride    . sodium chloride 10 mL/hr at 04/14/17 0645  . heparin 2,400 Units/hr (04/13/17 2204)     PRN Medications:  sodium chloride, acetaminophen, clonazePAM, LORazepam **OR** LORazepam, nitroGLYCERIN, sodium chloride flush, technetium TC 26M diethylenetriame-pentaacetic acid   Assessment/Plan   1. Acute on chronic combined systolic and diastolic CHF: EF 25-376-28%April 2018, NICM. ETOH vs. OSA.  - NYHA II-III - Recent ETOH abuse and dietary noncompliance.  - Weight down 2 pounds.  -3.3L - Continue po torsemide 20mg64mly.  - Will give 40mEq29m today.  - Continue spiro 25mg d43m - Continue losartan  - Not a candidate for Entresto with angioedema with lisinopril, he has  tolerated losartan without issues.  - Continue metoprolol XL 100 mg BID.  - Has not tolerated digoxin or Bidil in the past.  2. NSTEMI: troponin 1.99->3.37->3.28 - continue heparin gtt - For R and L heart cath today.  - Continue ASA - LDL 89 in Feb. 2018  - ASCVD risk 11.2%, start Crestor 10mg  -42mst pain free 3. AKI  - creatinine improved today.   Length of Stay: 1  Erin Madison15/2018, 7:34 AM  Advanced Heart Failure Team Pager [phone] - 4p)  Please contact [facility] for night-coverage after hours (4p -7a ) and weekends on [email or web address]  Patient seen with NP, agree with the above note.  1. Chest pain with some pleuritic character, diaphoresis, dyspnea: I am not totally clear what the  etiology of his symptoms was. His ECG really does not look markedly different from 10/17.  His D dimer was elevated but peripheral venous dopplers showed no DVT, CTA chest showed no PE (though study not technically adequate to comment on a more peripheral PE), and V/Q scan was negative => doubt PE.  On exam, he has not appeared markedly volume overloaded but has diuresed some with po torsemide and is breathing better.  Troponin peaked at 3.37.  He had a cath in 2016 without significant coronary disease but he continues to smoke, so NSTEMI is definitely a concern. Finally, cannot rule out acute pericarditis with his pleuritic symptoms.  - Will plan R/LHC today.  Will use brachial access for RHC and femoral for left (had severe radial vasospasm with previous cath).  Discussed risks/benefits of procedure and he agrees to it.  - Continue heparin gtt for now.  - Will get echo given pleuritic symptoms, ?pericardial effusion/pericarditis.  ESR mildly elevated.   2. ?Acute on chronic systolic CHF: As above, he looks fairly optimized (though difficult exam).  His weight is lower.  Continue po torsemide but will assess filling pressures on RHC.  3. ETOH abuse: Needs to stop, discussed.  4.  Smoking: Needs to stop, discussed. Wearing nicotine patch.   [name] 04/14/2017 7:58 AM

## 2017-04-14 NOTE — Progress Notes (Signed)
[place] for Heparin Indication: pulmonary embolus / ACS  Allergies  Allergen Reactions  . Ace Inhibitors Anaphylaxis and Swelling  . Buspirone Other (See Comments)    dizziness    Patient Measurements: Height: 5\' 9"  (175.3 cm) Weight: (!) 405 lb 4.8 oz (183.8 kg) IBW/kg (Calculated) : 70.7  Ideal body weight: 69.6 kg Heparin Dosing Weight: ~115 kg  Vital Signs: Temp: 97.8 F (36.6 C) (05/15 0655) Temp Source: Oral (05/15 0655) BP: 109/52 (05/15 0655) Pulse Rate: 59 (05/15 0655)  Labs:  Recent Labs  04/12/17 0553  04/13/17 0035 04/13/17 0631 04/13/17 0945 04/13/17 1229 04/13/17 1839 04/14/17 0403  HGB 15.1  --  14.5  --   --   --   --  14.3  HCT 42.5  --  41.5  --   --   --   --  40.5  PLT 220  --  192  --   --   --   --  214  LABPROT  --   --   --   --   --   --  14.2  --   INR  --   --   --   --   --   --  1.10  --   HEPARINUNFRC  --   < > 0.35  --  0.29*  --   --  0.28*  CREATININE 1.34*  --  1.50*  --   --   --   --  1.32*  TROPONINI  --   < > 1.08* 1.99*  --  3.37* 3.28*  --   < > = values in this interval not displayed.  Estimated Creatinine Clearance: 112.2 mL/min (A) (by C-G formula based on SCr of 1.32 mg/dL (H)).  Assessment: 51 year old male with history of PE and now possible new PE in setting of elevated D-dimer and inconclusive CT Angio (unable to rule out segmental PE), LE  Dopplers were negative, and VQ scan low probability.   Plan cath today as Troponin rose to 3.9  States has not been on anticoagulants recently but did receive Xarelto with prior PE a couple of years ago.  Started on IV heparin drip  And increased to 2400 units/hr but  heparin level falling to 0.28  No bleeding noted. CBC ok   Goal of Therapy:  Heparin level 0.3-0.7 units/ml Monitor platelets by anticoagulation protocol: Yes   Plan:  Increase heparin drip slightly 2550 uts/hr -Daily heparin level and CBC while on therapy Follow up  [facility] plan after cath  Bonnita Nasuti Pharm.D. CPP, BCPS Clinical Pharmacist [phone] 04/14/2017 7:35 AM

## 2017-04-14 NOTE — Progress Notes (Signed)
Family Medicine Teaching Service Daily Progress Note Intern Pager: [phone]  Patient name: [name] Medical record number: [id number] Date of birth: 1965-12-10 Age: 51 y.o. Gender: male  Primary Care Provider: Alycia Rossetti, MD Consultants: Cardiology Code Status: Full  Pt Overview and Major Events to Date:  5/13 Admit FPTS for likely repeat PE  Assessment and Plan: [name] is a 51 y.o. male presenting with chest tightness radiating to neck.  PMH is significant for HTN, HFrEF (Last Echo 01/2017 with EF 25-30% and diffuse hypokinesis.), T2DM ( Last A1c in 01/2017 was 6.1%), sleep apnea, tobacco use, alcohol use, history of PE, panic attacks and depression.   Chest tightness/dyspnea  - Most likely ACS. Troponin trend 1.08>>> 3.37. On heparin gtt, cardiology following, cards planning for cath today.  PE less likely with negative CTA (though inconclusive due to body habitus) and negative VQ scan. LE dopplers negative. - cards recs - R/L heart cath today - cardiac echo pending - heparin gtt - NPO for cath  AKI - Cr  Improved at 1.33 from 1.5 yesterday.   - AM BMP - follow up cards recs  HTN,  Stable. Continues to be normotensive.  At home on Losartan 50 mg, Metoprolol XL 100 mg BID, torsemeide 20 mg PO daily - Continue home meds   HFrEF, stable. Follows with Cardiology, Dr. Aundra Dubin.  BNP elevated to 158.1, on exam euvolemic.  At home on Metoprolol XL 100 mg BID,  Spironolactone 25 mg daily, and Demadex 20 mg.   - continue home meds - I's and O's  - daily weights (183.8lb  from 181 on admit) - cards on board  T2DM.  Stable.  Last A1c in 01/2017 was 6.1%.  Reports not taking Metformin as his A1c is well controlled.  - A1c pending  - will monitor CBGs with daily labs  Sleep apnea.  Reports compliance with home Bipap with 2L at night.   -Continue Bipap nightly   Depression/Anxiety.  Stable.  At home on Lexapro 20 mg daily and Klonopin 1 mg TID.  - Continue home  Lexapro - Klonopin 1 mg TID PRN for anxiety  Morbid obesity.  Weight 410 lbs.   - Heart healthy-carb modified diet - Encourage weight loss   Tobacco use.  Patient is a current everyday smoker. Smokes about 1/3 to 1 PPD.   -Nicotine 14 mg transdermal patch daily  -smoking cessation counseling  Alcohol use.  Reports occasional use however he notes he binges when he does drink.  Denies drug use. Placed on CIWA protocol - UDS pending  - monitor on CIWA   FEN/GI: HH Carb modified  Prophylaxis: Heparin gtt (treatment dose for PE), Protonix  Disposition: ?Home pending workup for dyspnea  Subjective:  Patient doing well this AM, denies any chest pain. No events overnight, vitals stable, agreeable to cath today.  Objective: Temp:  [97.4 F (36.3 C)-98.7 F (37.1 C)] 97.8 F (36.6 C) (05/15 0655) Pulse Rate:  [59-73] 59 (05/15 0655) Resp:  [18-20] 20 (05/15 0655) BP: (109-124)/(52-76) 109/52 (05/15 0655) SpO2:  [96 %-100 %] 100 % (05/15 0655) Weight:  [183.8 kg (405 lb 4.8 oz)] 183.8 kg (405 lb 4.8 oz) (05/15 9924) Physical Exam: General: NAD, rests in bed, nontoxic Cardiovascular:  RRR, no m/r/g Respiratory: CTA bil, distant breath sounds throughout due to body habitus Abdomen: soft and nontender Extremities: no LE edema bil  Laboratory:  UDS with opiates (rec'd dilaudid) Troponin trend 0.05>0.29>1.08 >1.99 EKG on admission NSR  without ST-T changes EKG 5/14 with ST changes in lateral leads, cards on board  LE Dopplers Summary: No obvious evidence of deep vein or superficial thrombosis involving the visualized veins of the right lower extremity and left lower extremity.   Recent Labs Lab 04/12/17 0553 04/13/17 0035 04/14/17 0403  WBC 11.5* 10.2 9.3  HGB 15.1 14.5 14.3  HCT 42.5 41.5 40.5  PLT 220 192 214    Recent Labs Lab 04/12/17 0553 04/13/17 0035 04/14/17 0403  NA 138 139 140  K 3.6 3.6 3.4*  CL 104 102 103  CO2 26 28 28   BUN 17 22* 24*   CREATININE 1.34* 1.50* 1.32*  CALCIUM 9.1 8.8* 8.7*  GLUCOSE 108* 111* 112*    Imaging/Diagnostic Tests: Ct Angio Chest Pe W Or Wo Contrast 04/12/2017 FINDINGS: Cardiovascular: Characterization of the peripheral segmental and subsegmental pulmonary arteries is markedly limited due to body habitus and associated quantum mottle. There is no central obstructing pulmonary embolism within the main, lobar or central segmental pulmonary arteries bilaterally. Thoracic aorta is normal in caliber and configuration. No aortic aneurysm or dissection. Cardiomegaly.  No pericardial effusion. Mediastinum/Nodes: No mass or enlarged lymph nodes within the mediastinum or perihilar regions. Esophagus appears normal. Trachea and central bronchi are unremarkable. Lungs/Pleura: Lungs are clear.  No pleural effusion or pneumothorax. Upper Abdomen: Limited images of the upper abdomen are unremarkable. Musculoskeletal: Mild degenerative change throughout the thoracic spine. No acute or suspicious osseous finding. Probable bilateral gynecomastia. Review of the MIP images confirms the above findings. IMPRESSION:  1. No central obstructing pulmonary embolism. More peripheral segmental and subsegmental pulmonary artery branches cannot be confidently evaluated due to patient body habitus and associated quantum mottle. As such, small peripheral nonocclusive pulmonary embolism cannot be excluded on this exam.  2. Cardiomegaly.  No pericardial effusion.  3. No aortic aneurysm or dissection.  4. Lungs are clear. No pneumonia or pulmonary edema. No pneumothorax.  CXR 5/14 FINDINGS: Stable cardiomediastinal silhouette and central pulmonary vascular congestion is noted. No pneumothorax or pleural effusion is noted. Bony thorax is unremarkable. No consolidative process is noted. IMPRESSION: Stable central pulmonary vascular congestion.    Adam Coombe, MD 04/14/2017, 9:41 AM PGY-1, [place] Intern  pager: [phone], text pages welcome

## 2017-04-14 NOTE — Progress Notes (Signed)
Pt in cath lab report received from Cath Lab.

## 2017-04-14 NOTE — Progress Notes (Signed)
10fr sheath aspirated and removed from right venous forearm. Manual pressure applied for 10 minutes , tegaderm applied, site level 0.  54fr sheath aspirated and removed from rfa manual pressure applied for 20 minutes. Groin level 0 , tegaderm dressing applied, bedrest instructions given.   Bilateral dp pulses palpable.  Bedrest begins at 15:45:00

## 2017-04-15 ENCOUNTER — Inpatient Hospital Stay (HOSPITAL_COMMUNITY): Payer: Medicare Other

## 2017-04-15 ENCOUNTER — Other Ambulatory Visit (HOSPITAL_COMMUNITY): Payer: Medicare Other

## 2017-04-15 ENCOUNTER — Encounter (HOSPITAL_COMMUNITY): Payer: Self-pay | Admitting: Cardiology

## 2017-04-15 DIAGNOSIS — R748 Abnormal levels of other serum enzymes: Secondary | ICD-10-CM

## 2017-04-15 DIAGNOSIS — R7989 Other specified abnormal findings of blood chemistry: Secondary | ICD-10-CM

## 2017-04-15 DIAGNOSIS — Z720 Tobacco use: Secondary | ICD-10-CM

## 2017-04-15 DIAGNOSIS — R778 Other specified abnormalities of plasma proteins: Secondary | ICD-10-CM

## 2017-04-15 DIAGNOSIS — I5021 Acute systolic (congestive) heart failure: Secondary | ICD-10-CM

## 2017-04-15 LAB — BASIC METABOLIC PANEL
Anion gap: 8 (ref 5–15)
BUN: 18 mg/dL (ref 6–20)
CALCIUM: 8.9 mg/dL (ref 8.9–10.3)
CO2: 24 mmol/L (ref 22–32)
CREATININE: 1.12 mg/dL (ref 0.61–1.24)
Chloride: 106 mmol/L (ref 101–111)
GFR calc Af Amer: >60 mL/min (ref >60)
Glucose, Bld: 118 mg/dL — ABNORMAL HIGH (ref 65–99)
Potassium: 3.9 mmol/L (ref 3.5–5.1)
SODIUM: 138 mmol/L (ref 135–145)

## 2017-04-15 LAB — CBC
HCT: 41.7 % (ref 39.0–52.0)
HEMOGLOBIN: 14.7 g/dL (ref 13.0–17.0)
MCH: 29.5 pg (ref 26.0–34.0)
MCHC: 35.3 g/dL (ref 30.0–36.0)
MCV: 83.7 fL (ref 78.0–100.0)
Platelets: 205 10*3/uL (ref 150–400)
RBC: 4.98 MIL/uL (ref 4.22–5.81)
RDW: 14.7 % (ref 11.5–15.5)
WBC: 7.4 10*3/uL (ref 4.0–10.5)

## 2017-04-15 LAB — ECHOCARDIOGRAM COMPLETE
HEIGHTINCHES: 69 in
WEIGHTICAEL: 6483.2 [oz_av]

## 2017-04-15 LAB — HEPARIN LEVEL (UNFRACTIONATED)

## 2017-04-15 MED ORDER — METOPROLOL SUCCINATE ER 50 MG PO TB24: 50.0000 mg | ORAL_TABLET | Freq: Every day | ORAL | Status: DC

## 2017-04-15 MED ORDER — METOPROLOL SUCCINATE ER 100 MG PO TB24
100.0000 mg | ORAL_TABLET | Freq: Every day | ORAL | Status: DC
Start: 2017-04-15 — End: 2017-04-15
  Administered 2017-04-15: 100 mg via ORAL
  Filled 2017-04-15: qty 1

## 2017-04-15 MED ORDER — PERFLUTREN LIPID MICROSPHERE
1.0000 mL | INTRAVENOUS | Status: AC | PRN
  Administered 2017-04-15: 2 mL via INTRAVENOUS
  Filled 2017-04-15: qty 10

## 2017-04-15 MED ORDER — ROSUVASTATIN CALCIUM 5 MG PO TABS: 5.0000 mg | ORAL_TABLET | Freq: Every day | ORAL | 0 refills | Status: DC

## 2017-04-15 MED ORDER — METOPROLOL SUCCINATE ER 100 MG PO TB24: ORAL_TABLET | ORAL | 6 refills | Status: DC

## 2017-04-15 MED ORDER — GI COCKTAIL ~~LOC~~
30.0000 mL | Freq: Once | ORAL | Status: AC
  Administered 2017-04-15: 30 mL via ORAL
  Filled 2017-04-15: qty 30

## 2017-04-15 MED ORDER — TORSEMIDE 20 MG PO TABS: 20.0000 mg | ORAL_TABLET | Freq: Every day | ORAL | 6 refills | Status: DC

## 2017-04-15 MED FILL — Nitroglycerin IV Soln 100 MCG/ML in D5W: INTRA_ARTERIAL | Qty: 10 | Status: AC

## 2017-04-15 MED FILL — Lidocaine HCl Local Inj 1%: INTRAMUSCULAR | Qty: 20 | Status: AC

## 2017-04-15 NOTE — Progress Notes (Signed)
Transitions of Care Pharmacy Note  Plan:  Educated on importance of daily weights and fluid control with heart failure, changes to medications (Toprol XL, torsemide), and smoking/alcohol cessation   Addressed concerns regarding: - Dieting and exercise  - Anxiety   Outpatient follow-up: - Trial of different antidepressant for anxiety - Possible benefit from a nutritional consult (patient had many questions)  --------------------------------------------- [name] is an 51 y.o. male who presents with a chief complaint of CP. In anticipation of discharge, pharmacy has reviewed this patient's prior to admission medication history, as well as current inpatient medications listed per the [facility].  Current medication indications, dosing, frequency, and notable side effects reviewed with patient. Patient verbalized understanding of current inpatient medication regimen and is aware that the After Visit Summary when presented, will represent the most accurate medication list at discharge.   [name] expressed concerns regarding: - Poor control of anxiety despite being on Lexapro 20 mg daily for ~3 months with no change in symptoms. He is using Klonopin 1 mg bid-tid pretty consistently. Recommended patient follows up with PCP about trial of a different agent - Dieting and exercising - he is actively trying to lose weight and admits to having a poor appetite at home. We had a lengthy discussion this evening over his multiple questions and interests. I feel that he might benefit from an outpatient nutrition consult.    Assessment: Understanding of regimen: good Understanding of indications: good Potential of compliance: good Barriers to Obtaining Medications: No  Patient instructed to contact inpatient pharmacy team with further questions or concerns if needed.    Time spent preparing for discharge counseling: 10 min  Time spent counseling patient: 45 min   Thank you for allowing pharmacy  to be a part of this patient's care.  Belia Heman, PharmD PGY1 Resident 04/15/2017 5:46 PM

## 2017-04-15 NOTE — Progress Notes (Signed)
  Echocardiogram 2D Echocardiogram has been performed.  [name] 04/15/2017, 1:56 PM

## 2017-04-15 NOTE — Progress Notes (Addendum)
Advanced Heart Failure Rounding Note  PCP: Dr. Buelah Manis, [place] Nunnery, MD Primary Cardiologist: Dr. Aundra Dubin   Subjective:    Anxious, wants to go home. Left heart cath with no CAD, right heart cath with cardiac index 2.0.   Continues to have pain in his back with radiation to his neck. Weight stable.    Objective:   Weight Range: (!) 405 lb 3.2 oz (183.8 kg) Body mass index is 59.84 kg/m.   Vital Signs:   Temp:  [97.8 F (36.6 C)-98.5 F (36.9 C)] 98 F (36.7 C) (05/16 0543) Pulse Rate:  [0-115] 61 (05/16 0543) Resp:  [0-18] 18 (05/16 0543) BP: (84-135)/(49-92) 128/82 (05/16 0543) SpO2:  [0 %-100 %] 97 % (05/16 0543) Weight:  [405 lb 3.2 oz (183.8 kg)] 405 lb 3.2 oz (183.8 kg) (05/16 0543) Last BM Date: 04/11/17  Weight change: Filed Weights   04/13/17 0454 04/14/17 0655 04/15/17 0543  Weight: (!) 407 lb 8 oz (184.8 kg) (!) 405 lb 4.8 oz (183.8 kg) (!) 405 lb 3.2 oz (183.8 kg)    Intake/Output:   Intake/Output Summary (Last 24 hours) at 04/15/17 0750 Last data filed at 04/15/17 0552  Gross per 24 hour  Intake              840 ml  Output                0 ml  Net              840 ml     Physical Exam: General:  Anxious obese male, sitting up in chair.  HEENT: normal Neck: thick, supple. No JVP. Carotids 2+ bilat; no bruits. No lymphadenopathy or thyromegaly appreciated. Cor: PMI nondisplaced. Heart regular rate and rhythm. No rubs, gallops or murmurs. Lungs: Clear in all lobes, no wheeze. Normal effort.  Abdomen: Soft, nontender. nondistended. No hepatosplenomegaly. No bruits or masses. Good bowel sounds. Extremities: no cyanosis, clubbing, rash. No edema.  Neuro: alert & orientedx3, cranial nerves grossly intact. moves all 4 extremities w/o difficulty. Anxious.    Telemetry: NSR, sinus brady 50-60.   Labs: CBC  Recent Labs  04/14/17 0403 04/15/17 0525  WBC 9.3 7.4  HGB 14.3 14.7  HCT 40.5 41.7  MCV 83.5 83.7  PLT 214 545   Basic Metabolic  Panel  Recent Labs  04/14/17 0403 04/15/17 0525  NA 140 138  K 3.4* 3.9  CL 103 106  CO2 28 24  GLUCOSE 112* 118*  BUN 24* 18  CREATININE 1.32* 1.12  CALCIUM 8.7* 8.9   Cardiac Enzymes  Recent Labs  04/13/17 0631 04/13/17 1229 04/13/17 1839  TROPONINI 1.99* 3.37* 3.28*    BNP: BNP (last 3 results)  Recent Labs  09/04/16 0726 03/18/17 1130 04/12/17 0553  BNP 41.8 52.6 158.1*    ProBNP (last 3 results) No results for input(s): PROBNP in the last 8760 hours.   D-Dimer  Recent Labs  04/12/17 0830  DDIMER 1.16*   Hemoglobin A1C  Recent Labs  04/12/17 1521  HGBA1C 6.1*   Fasting Lipid Panel No results for input(s): CHOL, HDL, LDLCALC, TRIG, CHOLHDL, LDLDIRECT in the last 72 hours. Thyroid Function Tests No results for input(s): TSH, T4TOTAL, T3FREE, THYROIDAB in the last 72 hours.  Invalid input(s): FREET3  Other results: EXAM: NUCLEAR MEDICINE VENTILATION - PERFUSION LUNG SCAN IMPRESSION: Negative VQ scan. No mismatched perfusion defects to suggest acute pulmonary embolism identified.  Right/Left Heart Cath and Coronary Angiography 04/14/17 1. No significant coronary  disease.  2. Mildly elevated right heart filling pressure, PWCP normal.  3. Low, but not markedly low, cardiac output  Right Heart Pressures [facility] Procedural Findings: Hemodynamics (mmHg) RA mean 13 RV 28/8 PA 31/13, mean 20 PCWP mean 12 LV 120/17 AO 121/79  Oxygen saturations: PA 66% AO 100%  Cardiac Output (Fick) 5.53  Cardiac Index (Fick) 2.0      Medications:     Scheduled Medications: . aspirin EC  81 mg Oral Daily  . escitalopram  20 mg Oral Daily  . folic acid  1 mg Oral Daily  . heparin  5,000 Units Subcutaneous Q8H  . losartan  50 mg Oral QHS  . metoprolol succinate  100 mg Oral BID  . multivitamin with minerals  1 tablet Oral Daily  . nicotine  14 mg Transdermal Daily  . pantoprazole  40 mg Oral Daily  . rosuvastatin  5 mg Oral q1800  . sodium  chloride flush  3 mL Intravenous Q12H  . sodium chloride flush  3 mL Intravenous Q12H  . spironolactone  25 mg Oral Daily  . thiamine  100 mg Oral Daily   Or  . thiamine  100 mg Intravenous Daily  . torsemide  20 mg Oral Daily    Infusions: . sodium chloride      PRN Medications: sodium chloride, acetaminophen, clonazePAM, LORazepam **OR** LORazepam, nitroGLYCERIN, ondansetron (ZOFRAN) IV, sodium chloride flush, technetium TC 29M diethylenetriame-pentaacetic acid   Assessment/Plan   1. Acute on chronic combined systolic and diastolic CHF: EF 67-59% in April 2018, NICM. ETOH vs. OSA.  - NYHA II  - Recent ETOH abuse and dietary noncompliance.  - Weight stable, continue daily torsemide 68m  - Continue spiro 237mdaily - Continue losartan 5018maily.  - Not a candidate for Entresto with angioedema with lisinopril, he has tolerated losartan without issues.  - He is worried about his HR, he was told last night that his HR was in the 50's and his metoprolol was held, this gives him a great deal of anxiety. He prefers to go back to taking 77m59m night which was a previous home dose. Will order 77mg29mnight.  - Has not tolerated digoxin or Bidil in the past.  2. Elevated troponin/Chest pain: troponin 1.99->3.37->3.28 - Chest pain free - No CAD by cath.  - Continue ASA + statin. - Echo pending, worry about pericarditis with elevated ESR and pleuritic pain.  3. AKI  - Improved.  4. Anxiety:  - Expresses a great deal of anxiety, and this drives his alcohol use. I encouraged him to seek counseling and talk to his PCP about medication adjustments.    Length of Stay: 2  ErLoomis 04/15/2017, 7:50 AM Advanced Heart Failure Team  Pager [phone]-4pm.  Please contact [facility] for night-coverage after hours (4p -7a ) and weekends on [email or web address]  Patient seen with NP, agree with the above note.  Cath yesterday with no significant CAD and normal PCWP.  I do not  have a great explanation for this episode, may be acute on chronic systolic CHF as he has felt better getting torsemide (was not taking regularly at home) though would not have expected such a significant rise in troponin with a mild CHF exacerbation.  No evidence for PE with extensive workup.  He will be getting an echo (?myopericarditis, look for pericardial effusion).  If echo stable, can go home on his prior to admission med regimen, but needs to  make sure to take torsemide regularly (every day).   He needs to cut out ETOH and cigarettes.   [name] 04/15/2017 1:41 PM

## 2017-04-15 NOTE — Progress Notes (Signed)
Family Medicine Teaching Service Daily Progress Note Intern Pager: [phone]  Patient name: [name] Medical record number: [id number] Date of birth: Oct 28, 1966 Age: 51 y.o. Gender: male  Primary Care Provider: Alycia Rossetti, MD Consultants: Cardiology Code Status: Full  Pt Overview and Major Events to Date:  5/13 Admit FPTS for likely repeat PE  Assessment and Plan: [name] is a 51 y.o. male presenting with chest tightness radiating to neck.  PMH is significant for HTN, HFrEF (Last Echo 01/2017 with EF 25-30% and diffuse hypokinesis.), T2DM ( Last A1c in 01/2017 was 6.1%), sleep apnea, tobacco use, alcohol use, history of PE, panic attacks and depression.   Chest tightness/dyspnea  - considered ACS. Troponin trend 1.08>>> 3.37. On heparin gtt, cardiology following, cards planning for cath today.  PE less likely with negative CTA (though inconclusive due to body habitus) and negative VQ scan. LE dopplers negative. Right/left heart cath showed no significatn CAD, mildly elevated R heart filling pressure and low but not markedly low cardiac output. - cards recs - continue torsemide, spiro, losartan. Go to 50 mg metop qnightly - cardiac echo pending - eval for pericarditis  AKI - Cr  Improved at 1.12 from 1.33. - AM BMP - follow up cards recs  HTN,  Stable. Continues to be normotensive.  At home on Losartan 50 mg, Metoprolol XL 100 mg BID, torsemide 20 mg PO daily - Continue home meds   HFrEF, stable. Follows with Cardiology, Dr. Aundra Dubin.  BNP elevated to 158.1, on exam euvolemic.  At home on Metoprolol XL 100 mg BID,  Spironolactone 25 mg daily, and Demadex 20 mg.   - continue home meds - I's and O's  - daily weights - cards on board  T2DM.  Stable.  A1c 6.1% this admission.  Reports not taking Metformin as his A1c is well controlled.   - monitor CBGs with daily labs  Sleep apnea.  Reports compliance with home Bipap with 2L at night.   -Continue Bipap nightly    Depression/Anxiety.  Stable.  At home on Lexapro 20 mg daily and Klonopin 1 mg TID.  - Continue home Lexapro - Klonopin 1 mg TID PRN for anxiety  Morbid obesity.  Weight 410 lbs.   - Heart healthy-carb modified diet - Encourage weight loss   Tobacco use.  Patient is a current everyday smoker. Smokes about 1/3 to 1 PPD.   -Nicotine 14 mg transdermal patch daily  -smoking cessation counseling  Alcohol use.  Reports occasional use however he notes he binges when he does drink.  Denies drug use. Placed on CIWA protocol - UDS pending  - monitor on CIWA   FEN/GI: HH Carb modified  Prophylaxis: Heparin gtt (treatment dose for PE), Protonix  Disposition: ?Home pending workup for dyspnea  Subjective:  Patient doing well this AM. Endorses episodes of chest pain overnight which he notes were associated with anxiety and dyspnea. No chest pain this AM. States he feels well this morning with no current chest pain.  Objective: Temp:  [97.8 F (36.6 C)-98.5 F (36.9 C)] 98 F (36.7 C) (05/16 0543) Pulse Rate:  [0-115] 61 (05/16 0543) Resp:  [0-18] 18 (05/16 0543) BP: (84-135)/(49-92) 128/82 (05/16 0543) SpO2:  [0 %-100 %] 97 % (05/16 0543) Weight:  [183.8 kg (405 lb 3.2 oz)] 183.8 kg (405 lb 3.2 oz) (05/16 0543) Physical Exam:  General: NAD, sits next to the bed eating breakfast, nontoxic Cardiovascular:  RRR, no m/r/g Respiratory: CTA bil, distant breath  sounds throughout due to body habitus Abdomen: soft and nontender Extremities: no LE edema bil  Laboratory: UDS with opiates (rec'd dilaudid) Troponin trend 0.05>0.29>1.08 >1.99 EKG on admission NSR without ST-T changes EKG 5/14 with ST changes in lateral leads, cards on board A1c 6.1   LE Dopplers Summary: No obvious evidence of deep vein or superficial thrombosis involving the visualized veins of the right lower extremity and left lower extremity.   Recent Labs Lab 04/13/17 0035 04/14/17 0403 04/15/17 0525  WBC  10.2 9.3 7.4  HGB 14.5 14.3 14.7  HCT 41.5 40.5 41.7  PLT 192 214 205    Recent Labs Lab 04/13/17 0035 04/14/17 0403 04/15/17 0525  NA 139 140 138  K 3.6 3.4* 3.9  CL 102 103 106  CO2 28 28 24   BUN 22* 24* 18  CREATININE 1.50* 1.32* 1.12  CALCIUM 8.8* 8.7* 8.9  GLUCOSE 111* 112* 118*    Imaging/Diagnostic Tests: Ct Angio Chest Pe W Or Wo Contrast 04/12/2017 FINDINGS: Cardiovascular: Characterization of the peripheral segmental and subsegmental pulmonary arteries is markedly limited due to body habitus and associated quantum mottle. There is no central obstructing pulmonary embolism within the main, lobar or central segmental pulmonary arteries bilaterally. Thoracic aorta is normal in caliber and configuration. No aortic aneurysm or dissection. Cardiomegaly.  No pericardial effusion. Mediastinum/Nodes: No mass or enlarged lymph nodes within the mediastinum or perihilar regions. Esophagus appears normal. Trachea and central bronchi are unremarkable. Lungs/Pleura: Lungs are clear.  No pleural effusion or pneumothorax. Upper Abdomen: Limited images of the upper abdomen are unremarkable. Musculoskeletal: Mild degenerative change throughout the thoracic spine. No acute or suspicious osseous finding. Probable bilateral gynecomastia. Review of the MIP images confirms the above findings. IMPRESSION:  1. No central obstructing pulmonary embolism. More peripheral segmental and subsegmental pulmonary artery branches cannot be confidently evaluated due to patient body habitus and associated quantum mottle. As such, small peripheral nonocclusive pulmonary embolism cannot be excluded on this exam.  2. Cardiomegaly.  No pericardial effusion.  3. No aortic aneurysm or dissection.  4. Lungs are clear. No pneumonia or pulmonary edema. No pneumothorax.  CXR 5/14 FINDINGS: Stable cardiomediastinal silhouette and central pulmonary vascular congestion is noted. No pneumothorax or pleural effusion is  noted. Bony thorax is unremarkable. No consolidative process is noted. IMPRESSION: Stable central pulmonary vascular congestion.   [name], MD 04/15/2017, 11:03 AM PGY-1, [employer] Intern pager: [phone], text pages welcome

## 2017-04-15 NOTE — Discharge Instructions (Signed)
You were seen for your chest pain. We believed you had pericarditis. Please make sure to follow up with your cardiologist as well as your primary care physician. During this admission your were started on a medication for your cholesterol, please continue taking this. Please make sure to take your torsemide every day this will help keep fluid off your heart.

## 2017-04-15 NOTE — Progress Notes (Signed)
Pt given discharge instructions, IV and tele removed. Questions answered. Taken out via wheelchair.

## 2017-04-21 ENCOUNTER — Telehealth: Payer: Self-pay | Admitting: *Deleted

## 2017-04-21 NOTE — Telephone Encounter (Signed)
Send to his cardiologist

## 2017-04-21 NOTE — Telephone Encounter (Signed)
Received fax from pharmacy.   Advised that Crestor 5mg  is not covered by insurance. Formulary alternatives are as follows: Pravastatin 40mg  Simvastatin 20mg  Lovastatin 40mg  Atorvastatin 10mg    Of note, patient recently in hospital for elevated troponin. Requires HFU.

## 2017-04-21 NOTE — Telephone Encounter (Signed)
Can change to atorvastatin 10 mg daily.

## 2017-04-22 MED ORDER — ATORVASTATIN CALCIUM 10 MG PO TABS: 10.0000 mg | ORAL_TABLET | Freq: Every day | ORAL | 3 refills | Status: DC

## 2017-04-22 NOTE — Telephone Encounter (Signed)
Sent Rx for atorvastatin 10 mg daily to pharmacy.   Ruta Hinds. Velva Harman, PharmD, BCPS, CPP Clinical Pharmacist Pager: [phone] Phone: [phone] 04/22/2017 11:21 AM

## 2017-04-22 NOTE — Addendum Note (Signed)
Addended by: Adora Fridge on: 04/22/2017 11:22 AM   Modules accepted: Orders

## 2017-04-23 ENCOUNTER — Ambulatory Visit (HOSPITAL_COMMUNITY)
Admit: 2017-04-23 | Discharge: 2017-04-23 | Disposition: A | Discharge status: Home / Self Care | Payer: Medicare Other | Attending: Internal Medicine | Admitting: Internal Medicine

## 2017-04-23 ENCOUNTER — Encounter (HOSPITAL_COMMUNITY): Payer: Self-pay

## 2017-04-23 VITALS — BP 130/90 | HR 76 | Wt >= 6400 oz

## 2017-04-23 DIAGNOSIS — K219 Gastro-esophageal reflux disease without esophagitis: Secondary | ICD-10-CM

## 2017-04-23 DIAGNOSIS — F419 Anxiety disorder, unspecified: Secondary | ICD-10-CM

## 2017-04-23 DIAGNOSIS — G4733 Obstructive sleep apnea (adult) (pediatric): Secondary | ICD-10-CM | POA: Diagnosis not present

## 2017-04-23 DIAGNOSIS — I429 Cardiomyopathy, unspecified: Secondary | ICD-10-CM | POA: Diagnosis not present

## 2017-04-23 DIAGNOSIS — F17210 Nicotine dependence, cigarettes, uncomplicated: Secondary | ICD-10-CM | POA: Insufficient documentation

## 2017-04-23 DIAGNOSIS — Z720 Tobacco use: Secondary | ICD-10-CM | POA: Diagnosis not present

## 2017-04-23 DIAGNOSIS — I5022 Chronic systolic (congestive) heart failure: Secondary | ICD-10-CM | POA: Diagnosis not present

## 2017-04-23 DIAGNOSIS — I110 Hypertensive heart disease with heart failure: Secondary | ICD-10-CM | POA: Diagnosis not present

## 2017-04-23 DIAGNOSIS — I428 Other cardiomyopathies: Secondary | ICD-10-CM | POA: Diagnosis not present

## 2017-04-23 DIAGNOSIS — I480 Paroxysmal atrial fibrillation: Secondary | ICD-10-CM | POA: Diagnosis not present

## 2017-04-23 DIAGNOSIS — Z86711 Personal history of pulmonary embolism: Secondary | ICD-10-CM | POA: Diagnosis not present

## 2017-04-23 LAB — BASIC METABOLIC PANEL
Anion gap: 9 (ref 5–15)
BUN: 14 mg/dL (ref 6–20)
CHLORIDE: 105 mmol/L (ref 101–111)
CO2: 24 mmol/L (ref 22–32)
Calcium: 9 mg/dL (ref 8.9–10.3)
Creatinine, Ser: 1.07 mg/dL (ref 0.61–1.24)
GFR calc non Af Amer: >60 mL/min (ref >60)
Glucose, Bld: 139 mg/dL — ABNORMAL HIGH (ref 65–99)
Potassium: 3.5 mmol/L (ref 3.5–5.1)
SODIUM: 138 mmol/L (ref 135–145)

## 2017-04-23 MED ORDER — OMEPRAZOLE 20 MG PO CPDR: 20.0000 mg | DELAYED_RELEASE_CAPSULE | Freq: Every day | ORAL | 11 refills | Status: DC

## 2017-04-23 MED ORDER — LOSARTAN POTASSIUM 50 MG PO TABS: 75.0000 mg | ORAL_TABLET | Freq: Every day | ORAL | 6 refills | Status: DC

## 2017-04-23 NOTE — Progress Notes (Signed)
Advanced Heart Failure Clinic Note   Primary Care:Dr. Buelah Manis, Modena Nunnery, MD Primary Cardiologist: Dr. Aundra Dubin   HPI: [name] is a 51 year old male with a past medical history of NICM, EF 25-30% in March 2018, felt to be related to prior ETOH abuse. He also has a history of PE 03/2014 completed a years course of Xarelto, morbid obesity, OSA, and tobacco abuse.   He was admitted 11/12/15-11/14/15 with acute on chronic systolic CHF and palpitations. He wore a 30 day event monitor at discharge as he had frequent PVC's and questionable Afib on telemetry. Also with some NSVT, so he was started on Amiodaone, but at follow up had not started taking it. He had previously refused ICD and was seen inpatient by Dr. Rayann Heman who felt that his morbid obesity was a prohibitive factor.   He was seen in the clinic in April 2018. He had started drinking ETOH again. Volume status was stable, he is not an Entresto candidate due to angioedema with lisinopril. Weight was 411 pounds.   Admitted 04/12/17 with SOB, chest pain. D- dimer was 1.16, chest CT without central obstructing PE, however more peripheral and subsegmental pulmonary artery branches were not confidently evaluated due to his body habitus. He was started on a heparin gtt for presumed PE, however VQ scan showed no PE. Troponin was elevated, peaked at 3.37. LHC showed no CAD. Echo showed an EF of 15%, grade 2 DD, no pericarditis. He was diuresed with IV lasix, and started on torsemide 20mg  at discharge. Discharge weight was 405 pounds.   He returns today for HF follow up. Complaining of acid reflux and headache today. 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 He has been eating high salt foods, mostly potato chips and nuts. He drinks more than 2L a day, he says he has abstained from alcohol since his last hospitalization. He is taking his medications with compliance, although sometimes he takes them at  varying times. He does not weigh himself at home, but notices when his weight is up because his abdomen becomes more swollen. He then takes an extra 20mg  torsemide and it resolves. He has been wearing his CPAP nightly.   Labs (5/13): K 4.1, creatinine 1.05 Labs (1/14): K 3.8, creatinine 1.16, BNP 54 Labs (2/14): K 3.7, creatinine 1.11, BNP 28 Labs (2/16): K 4.1, creatinine 1.03, LDL 96, HCT 40 Labs (3/16): K 3.7, K 1.13, BNP 367 Labs (8/16): BNP 43, K 3.5, creatinine 1.01 Labs (08/13/15): K 3.7, creatinine 1.18, HCT 41.3 Labs (12/16): K 3.7, creatinine 1.14, TGs 495, digoxin < 0.2 Labs (2/17): K 3.8, creatinine 0.99, LDL 107, TGs 222 Labs (5/17): K 4, creatinine 1.47 Labs (2/18): K 3.9, creatinine 1.06, hgb 15.1, TGs 163, LDL 89, HDL 28, TSH normal  Labs (5/18): K 3.8, creatinine 1.12.  PMH: 1. Nonischemic cardiomyopathy: Prior cath with no significant CAD.  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  Prior echoes with EF as low as 25%.  Echo (9/13) with EF 35-40%, moderate to severe LV dilation, diffuse hypokinesis, mild MR. Echo (5/15) with EF 30-35%, moderate to severe LAE, normal RV size and systolic function.  Angioedema with ACEI, headaches with hydralazine/nitrates. Echo (3/16) with EF 25-30%, severe LV dilation, normal RV size and systolic function.  [facility] 08/13/15 showed no significant coronary disease; RA mean 6, PA 33/11 mean 23, PCWP mean 13, Fick CO/CI 4.75 /  1.68 (difficult study, radial artery spasm, if needs future cath would use groin).  Echo (9/16) showed EF 20-25%.   - Echo (3/18): EF 25-30%, moderate LAE, Echo 4/18 EF 15%  2. HTN: angioedema with ACEI.  3. OSA: on Bipap 4. Morbid obesity 5. Paroxysmal atrial fibrillation: Not documented recently.   6. Smoker.  7. Anxiety/panic attacks 8. PE: 5/15, diagnosed by V/Q scan. CTA chest 8/16 negative for PE.  9. NSVT, PVCs: 30 day monitor (12/16) with PVCs, PACs, no atrial fibrillation.  10.  Hematuria: Apparently had negative workup by urology.  11. ABIs (6/16) were normal 12. 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  13. Gout 14. Low back pain.   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  Has son and daughter.    FH: No premature CAD.    ROS: All systems reviewed and negative except as per HPI.   Past Medical History:  Diagnosis Date  . Alcohol abuse   . Anxiety state, unspecified   . Atrial fibrillation ([place])   . CHF (congestive heart failure) ([place])   . Chronic systolic heart failure ([place])   . Edema   . History of medication noncompliance   . Migraine   . Obesity, unspecified   . Obstructive sleep apnea   . Psychiatric disorder   . Pulmonary embolism ([place])   . Shortness of breath     Current Outpatient Prescriptions  Medication Sig Dispense Refill  . aspirin EC 81 MG tablet Take 1 tablet (81 mg total) by mouth daily. 90 tablet 3  . atorvastatin (LIPITOR) 10 MG tablet Take 1 tablet (10 mg total) by mouth daily. 90 tablet 3  . clonazePAM (KLONOPIN) 1 MG tablet TAKE 1 TABLET BY MOUTH 3 TIMES DAILY AS NEEDED FOR ANXIETY 90 tablet 2  . escitalopram (LEXAPRO) 20 MG tablet Take 1 tablet (20 mg total) by mouth daily. 30 tablet 6  . fenofibrate 54 MG tablet TAKE 1 TABLET (54 MG TOTAL) BY MOUTH DAILY. 90 tablet 3  . KLOR-CON M20 20 MEQ tablet TAKE 1 TABLET (20 MEQ TOTAL) BY MOUTH DAILY. (Patient taking differently: Take 20 mEq by mouth daily as needed (for cramps). ) 30 tablet 1  . losartan (COZAAR) 50 MG tablet Take 1 tablet (50 mg total) by mouth at bedtime. (Patient taking differently: Take 50 mg by mouth daily. ) 30 tablet 6  . magnesium oxide (MAG-OX) 400 MG tablet Take 1 tablet (400 mg total) by mouth daily. (Patient taking differently: Take 400 mg by mouth daily as needed (supplement). ) 30 tablet 11  . metoprolol succinate (TOPROL-XL) 100 MG 24 hr tablet Take 100 mg in the AM and 50 mg in the PM 60 tablet 6  .  Multiple Vitamins-Minerals (MULTIVITAMIN PO) Take 1 tablet by mouth daily.    . NON FORMULARY Bipap, pressure 16/12 with 2L of O2    . pantoprazole (PROTONIX) 40 MG tablet TAKE 1 TABLET (40 MG TOTAL) BY MOUTH DAILY. 30 tablet 6  . sildenafil (REVATIO) 20 MG tablet TAKE 1 TABLET (20 MG TOTAL) BY MOUTH DAILY AS NEEDED. (Patient taking differently: Take 20 mg by mouth daily as needed (ED). ) 15 tablet 3  . spironolactone (ALDACTONE) 25 MG tablet TAKE 1 TABLET (25 MG TOTAL) BY MOUTH DAILY. 30 tablet 6  . thiamine (VITAMIN B-1) 100 MG tablet Take 100 mg by mouth daily.    Marland Kitchen torsemide (DEMADEX) 20 MG tablet Take 1 tablet (  20 mg total) by mouth daily. 30 tablet 6   No current facility-administered medications for this encounter.     Allergies  Allergen Reactions  . Ace Inhibitors Anaphylaxis and Swelling  . Buspirone Other (See Comments)    dizziness      Social History   Social History  . Marital status: Married    Spouse name: N/A  . Number of children: 3  . Years of education: N/A   Occupational History  . DISABLED    Social History Main Topics  . Smoking status: Current Every Day Smoker    Packs/day: 0.50    Years: 30.00    Types: Cigarettes  . Smokeless tobacco: Never Used     Comment: 10/2014- using patches  . Alcohol use 0.0 oz/week     Comment: nothing to drink since last OV with Dr. Harl Bowie per patient  . Drug use: No  . Sexual activity: Yes   Other Topics Concern  . Not on file   Social History Narrative   He smokes about a pack per day and he has been smoking since he was 51 years of age.  He drinks alcohol occasionally, but he denies any illicit drug abuse.  He is presently on disability.    Lives with wife in a 2 story home.  Has 2 children.   Previously worked in [employer], last worked in 1998.   Highest level of education:  11th grade              Family History  Problem Relation Age of Onset  . Cancer Mother        brain tumor  . Hypertension Mother     . Diabetes Father        Deceased, 61  . Heart disease Maternal Grandmother   . Hypertension Unknown        Family History  . Stroke Unknown        Family History  . Diabetes Unknown        Family History  . Diabetes Daughter     Vitals:   04/23/17 1208  BP: 130/90  Pulse: 76  SpO2: 95%  Weight: (!) 411 lb 6.4 oz (186.6 kg)     PHYSICAL EXAM: General:  Obese male, NAD.  HEENT: normal Neck: supple. Hard to assess JVD, does not appear elevated. Carotids 2+ bilat; no bruits. No lymphadenopathy or thyromegaly appreciated. Cor: PMI nondisplaced. Regular rate & rhythm. No rubs, gallops or murmurs. Lungs: clear bilaterally. No wheeze. Normal effort.  Abdomen: soft, nontender, nondistended. No hepatosplenomegaly. No bruits or masses. Good bowel sounds. Extremities: no cyanosis, clubbing, rash, edema Neuro: alert & oriented x 3, cranial nerves grossly intact. moves all 4 extremities w/o difficulty. Affect pleasant.    ASSESSMENT & PLAN: 1. Chronic systolic CHF: Nonischemic cardiomyopathy. Echo (3/18) with EF 25-30%. He was seen by EP and decided against ICD given marked obesity.  QRS not wide enough for CRT. Echo 4/18 EF 15%.  - NYHA class II symptoms.  - Volume status stable on exam. Continue torsemide 20mg  daily.  - Continue Spiro 25 mg daily - He had angioedema with ACEI, but the risk of angioedema in this situation with ARB use is low.  Risk of recurrent angioedema with Delene Loll would be higher, so will not switch ARB to entresto.  - Will increase losartan to 75mg  hs.  - Continue Toprol XL 100mg  in the am and 50 mg in the pm. We have tried increasing it in the  past and he did not like the way it made him feel.  - Had side effects from digoxin, does not want to take. - Headaches with Bidil, cannot take.  2. Obesity:  - Encouraged him to start an exercise routine.  - Decrease portion size.   3. Smoking:  - Encouraged cessation.   4. OSA:  - Continue Bipap.  5. Paroxysmal  atrial fibrillation:  - Remote. He is no longer anticoagulated. Event monitor in 12/16 showed no atrial fibrillation.  6. PE: 5/15, diagnosed by V/Q scan.  He was on Xarelto for about a year but stopped in the setting of hematuria.  - Recent hospitalization without PE on CT and VQ scan. Not on anticoagulation.  7. Anxiety - A lot of his symptoms can be attributed to this. He is on Lexapro 20mg  daily, but does not feel like this does much for him. He is going to see his PCP next week and is going to ask for an additional anti-anxiety med or switch to a different med.  - Encouraged him to use exercise and activity as an outlet as opposed to alcohol.  8. GERD - Changed protonix to prilosec to see if this would help.   BMET today. Follow up in 2 weeks to check BP.      Arbutus Leas, NP 04/23/17

## 2017-04-23 NOTE — Patient Instructions (Addendum)
STOP Protonix.  START Prilosec 20 mg tablet once daily.  INCREASE Losartan to 75 mg (1.5 tabs) once daily.  Routine lab work today. Will notify you of abnormal results, otherwise no news is good news!  Follow up 2 weeks.  Do the following things EVERYDAY: 1) Weigh yourself in the morning before breakfast. Write it down and keep it in a log. 2) Take your medicines as prescribed 3) Eat low salt foods-Limit salt (sodium) to 2000 mg per day.  4) Stay as active as you can everyday 5) Limit all fluids for the day to less than 2 liters

## 2017-04-28 ENCOUNTER — Inpatient Hospital Stay: Payer: Medicare Other | Admitting: Family Medicine

## 2017-05-05 ENCOUNTER — Inpatient Hospital Stay (HOSPITAL_COMMUNITY): Admission: RE | Admit: 2017-05-05 | Payer: Medicare Other | Source: Ambulatory Visit

## 2017-05-20 ENCOUNTER — Other Ambulatory Visit: Payer: Self-pay | Admitting: Family Medicine

## 2017-06-03 ENCOUNTER — Other Ambulatory Visit: Payer: Self-pay | Admitting: Family Medicine

## 2017-06-15 ENCOUNTER — Telehealth: Payer: Self-pay | Admitting: *Deleted

## 2017-06-15 NOTE — Telephone Encounter (Signed)
Patient spouse, Hoyle Sauer in office. [phone].  States that a letter for [facility] is required for [place]. The letter is required for proof of use for oxygen machine in home.   Copy of request forwarded to MD.

## 2017-06-16 ENCOUNTER — Encounter: Payer: Self-pay | Admitting: Family Medicine

## 2017-06-16 NOTE — Telephone Encounter (Signed)
Pt wife here in person, given letter Advised we need 5 days for forms

## 2017-06-16 NOTE — Telephone Encounter (Signed)
Received call from patient.   States that he is calling to inquire about letter request.   Advised that MD is reviewing request and patient will be made aware of provider recommendations.

## 2017-07-01 DIAGNOSIS — I509 Heart failure, unspecified: Secondary | ICD-10-CM | POA: Diagnosis not present

## 2017-07-01 DIAGNOSIS — I10 Essential (primary) hypertension: Secondary | ICD-10-CM | POA: Diagnosis not present

## 2017-07-01 DIAGNOSIS — G4733 Obstructive sleep apnea (adult) (pediatric): Secondary | ICD-10-CM | POA: Diagnosis not present

## 2017-08-01 DIAGNOSIS — I10 Essential (primary) hypertension: Secondary | ICD-10-CM | POA: Diagnosis not present

## 2017-08-01 DIAGNOSIS — I509 Heart failure, unspecified: Secondary | ICD-10-CM | POA: Diagnosis not present

## 2017-08-01 DIAGNOSIS — G4733 Obstructive sleep apnea (adult) (pediatric): Secondary | ICD-10-CM | POA: Diagnosis not present

## 2017-08-06 ENCOUNTER — Encounter: Payer: Self-pay | Admitting: Family Medicine

## 2017-08-12 ENCOUNTER — Inpatient Hospital Stay (HOSPITAL_COMMUNITY): Admission: RE | Admit: 2017-08-12 | Payer: Medicare Other | Source: Ambulatory Visit | Admitting: Cardiology

## 2017-08-24 ENCOUNTER — Ambulatory Visit (INDEPENDENT_AMBULATORY_CARE_PROVIDER_SITE_OTHER): Payer: Medicare HMO | Admitting: Family Medicine

## 2017-08-24 ENCOUNTER — Encounter: Payer: Self-pay | Admitting: Family Medicine

## 2017-08-24 VITALS — BP 130/78 | HR 64 | Temp 98.4°F | Resp 20 | Wt >= 6400 oz

## 2017-08-24 DIAGNOSIS — J069 Acute upper respiratory infection, unspecified: Secondary | ICD-10-CM | POA: Diagnosis not present

## 2017-08-24 DIAGNOSIS — F411 Generalized anxiety disorder: Secondary | ICD-10-CM

## 2017-08-24 DIAGNOSIS — Z91148 Patient's other noncompliance with medication regimen for other reason: Secondary | ICD-10-CM

## 2017-08-24 DIAGNOSIS — I5022 Chronic systolic (congestive) heart failure: Secondary | ICD-10-CM

## 2017-08-24 DIAGNOSIS — Z9114 Patient's other noncompliance with medication regimen: Secondary | ICD-10-CM

## 2017-08-24 DIAGNOSIS — E0821 Diabetes mellitus due to underlying condition with diabetic nephropathy: Secondary | ICD-10-CM

## 2017-08-24 DIAGNOSIS — R600 Localized edema: Secondary | ICD-10-CM | POA: Diagnosis not present

## 2017-08-24 DIAGNOSIS — R69 Illness, unspecified: Secondary | ICD-10-CM | POA: Diagnosis not present

## 2017-08-24 DIAGNOSIS — F1010 Alcohol abuse, uncomplicated: Secondary | ICD-10-CM

## 2017-08-24 MED ORDER — AZITHROMYCIN 250 MG PO TABS: ORAL_TABLET | ORAL | 0 refills | Status: DC

## 2017-08-24 MED ORDER — CLONAZEPAM 1 MG PO TABS: ORAL_TABLET | ORAL | 2 refills | Status: DC

## 2017-08-24 MED ORDER — SILDENAFIL CITRATE 20 MG PO TABS: 20.0000 mg | ORAL_TABLET | Freq: Every day | ORAL | 3 refills | Status: DC | PRN

## 2017-08-24 NOTE — Patient Instructions (Addendum)
Schedule with Dr. Aundra Dubin  Mucinex DM, take antibiotics  We will call with lab results Referral to psychiatry  F/U  4 months

## 2017-08-24 NOTE — Progress Notes (Signed)
Subjective:    Patient ID: [name], male    DOB: 1966/11/12, 51 y.o.   MRN: [id number]  Patient presents for URI (cough/congestion x 2 weeks)  Pt here with URI symptoms x 2 weeks, cough with production, taking mucinex DM, cough keeps him up at night. He has not had any fever or chills   Note revieed chart he has no showed last 2 cardiology appointments He was admitted back in [facility] for possibble PE, torponoin also elevated, cardiology constulted he had cardaic cath no blockage but continued on medications. Initially also concern for pericarditis but Echo showed his baseline low cardiac function  - Has not been taking his fluid medication regulary His weight is up 7 pounds from May when he was last hospitalized. He states that he soak one yesterday and he did urinate a lot and noticed some swelling going down his thighs.   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 States he is not sleeping because of that. He has still been taking his clonazepam and his Lexapro when he needs it. He was told the past that he needs to follow with psychiatry because of his history his complexity and he often start and stop his medications. He states he will not go back to [facility] or the previous psychiatrist but is willing to go to a different one   Review Of Systems:  GEN- denies fatigue, fever, weight loss,weakness, recent illness HEENT- denies eye drainage, change in vision, nasal discharge, CVS- denies chest pain, palpitations RESP- denies SOB, +cough, wheeze ABD- denies N/V, change in stools, abd pain GU- denies dysuria, hematuria, dribbling, incontinence MSK- denies joint pain, muscle aches, injury Neuro- denies headache, dizziness, syncope, seizure activity       Objective:     BP 130/78 (BP Location: Right Arm, Patient Position: Sitting, Cuff Size: Large)   Pulse 64   Temp 98.4 F (36.9 C) (Oral)   Resp 20   Wt (!) 418 lb 9.6 oz (189.9 kg)   BMI 61.82 kg/m  GEN- NAD, alert and oriented x3 HEENT- PERRL, EOMI, non injected sclera, pink conjunctiva, MMM, oropharynx clear, no maxillary sinus tenderness Neck- Supple, no LAD CVS- RRR, no murmur RESP-distant BS, mild upper aiway congestion, no rales, normal WOB,sat 96% ABD-NABS,soft,NT,ND EXT- Peripheral edema to shins 1+ non pitting  Psych- normal affect and mood  Pulses- Radial  2+        Assessment & Plan:      Problem List Items Addressed This Visit      Unprioritized   Non compliance w medication regimen   Diabetes mellitus ([place])   Relevant Orders   CBC with Differential/Platelet   Comprehensive metabolic panel   Hemoglobin A1c   Bilateral leg edema - Primary   Alcohol abuse   GAD (generalized anxiety disorder)   Chronic systolic CHF (congestive heart failure) ([facility])    Think that he does have upper respiratory infection he denies any smoking but does admit to alcohol abuse. He has had pneumonia and complications in the past on that a cup or him with azithromycin. He can use Mucinex DM. Some of his cough could be due to some fluid as he has not been taking his diuretics. Discussed taking his diuretics as  prescribed also discussed that he is high risk for hospitalization. I will check a BNP. He is to reschedule with cardiology as soon as possible.  For his anxiety this has been a long-standing issues please see the previous multiple notes about his compliance and need to see a psychiatrist and a therapist. I'm not any changes medications we have done this multiple times he is noncompliant. I do not one a stop his benzo diazepam as he has been taking it as I think he will go in withdrawal and with his cardiac history this will cause complications. Discussed importance of not drinking while using his  benzo diazepam. He is taking the Lexapro per her report. I honestly  think that he does not want help most the time. He does not follow any instructions from his specialist or myself.  Recheck his labs for diabetes mellitus well with his weight gain.      Relevant Medications   sildenafil (REVATIO) 20 MG tablet   Other Relevant Orders   Brain natriuretic peptide    Other Visit Diagnoses    Upper respiratory tract infection, unspecified type       Relevant Medications   azithromycin (ZITHROMAX) 250 MG tablet      Note: This dictation was prepared with Dragon dictation along with smaller phrase technology. Any transcriptional errors that result from this process are unintentional.

## 2017-08-24 NOTE — Assessment & Plan Note (Signed)
Think that he does have upper respiratory infection he denies any smoking but does admit to alcohol abuse. He has had pneumonia and complications in the past on that a cup or him with azithromycin. He can use Mucinex DM. Some of his cough could be due to some fluid as he has not been taking his diuretics. Discussed taking his diuretics as prescribed also discussed that he is high risk for hospitalization. I will check a BNP. He is to reschedule with cardiology as soon as possible.  For his anxiety this has been a long-standing issues please see the previous multiple notes about his compliance and need to see a psychiatrist and a therapist. I'm not any changes medications we have done this multiple times he is noncompliant. I do not one a stop his benzo diazepam as he has been taking it as I think he will go in withdrawal and with his cardiac history this will cause complications. Discussed importance of not drinking while using his benzo diazepam. He is taking the Lexapro per her report. I honestly  think that he does not want help most the time. He does not follow any instructions from his specialist or myself.  Recheck his labs for diabetes mellitus well with his weight gain.

## 2017-08-25 LAB — CBC WITH DIFFERENTIAL/PLATELET
Basophils Absolute: 42 cells/uL (ref 0–200)
Basophils Relative: 0.5 %
Eosinophils Absolute: 332 cells/uL (ref 15–500)
Eosinophils Relative: 4 %
HCT: 43.3 % (ref 38.5–50.0)
Hemoglobin: 14.8 g/dL (ref 13.2–17.1)
Lymphs Abs: 3735 cells/uL (ref 850–3900)
MCH: 28.7 pg (ref 27.0–33.0)
MCHC: 34.2 g/dL (ref 32.0–36.0)
MCV: 83.9 fL (ref 80.0–100.0)
MPV: 10.9 fL (ref 7.5–12.5)
Monocytes Relative: 9 %
Neutro Abs: 3445 cells/uL (ref 1500–7800)
Neutrophils Relative %: 41.5 %
Platelets: 246 10*3/uL (ref 140–400)
RBC: 5.16 10*6/uL (ref 4.20–5.80)
RDW: 14 % (ref 11.0–15.0)
Total Lymphocyte: 45 %
WBC mixed population: 747 cells/uL (ref 200–950)
WBC: 8.3 10*3/uL (ref 3.8–10.8)

## 2017-08-25 LAB — COMPREHENSIVE METABOLIC PANEL
AG RATIO: 1.1 (calc) (ref 1.0–2.5)
ALT: 15 U/L (ref 9–46)
AST: 12 U/L (ref 10–40)
Albumin: 3.7 g/dL (ref 3.6–5.1)
Alkaline phosphatase (APISO): 79 U/L (ref 40–115)
BUN: 18 mg/dL (ref 7–25)
CO2: 25 mmol/L (ref 20–32)
Calcium: 8.8 mg/dL (ref 8.6–10.3)
Chloride: 107 mmol/L (ref 98–110)
Creat: 1.18 mg/dL (ref 0.60–1.35)
GLUCOSE: 135 mg/dL — AB (ref 65–99)
Globulin: 3.3 g/dL (calc) (ref 1.9–3.7)
Potassium: 3.7 mmol/L (ref 3.5–5.3)
SODIUM: 139 mmol/L (ref 135–146)
TOTAL PROTEIN: 7 g/dL (ref 6.1–8.1)
Total Bilirubin: 0.7 mg/dL (ref 0.2–1.2)

## 2017-08-25 LAB — BRAIN NATRIURETIC PEPTIDE: Brain Natriuretic Peptide: 73 pg/mL (ref <100)

## 2017-08-25 LAB — HEMOGLOBIN A1C
Hgb A1c MFr Bld: 6 % of total Hgb — ABNORMAL HIGH (ref <5.7)
Mean Plasma Glucose: 126 (calc)
eAG (mmol/L): 7 (calc)

## 2017-08-31 ENCOUNTER — Ambulatory Visit (HOSPITAL_COMMUNITY): Payer: Medicare Other | Admitting: Licensed Clinical Social Worker

## 2017-08-31 DIAGNOSIS — G4733 Obstructive sleep apnea (adult) (pediatric): Secondary | ICD-10-CM | POA: Diagnosis not present

## 2017-08-31 DIAGNOSIS — I509 Heart failure, unspecified: Secondary | ICD-10-CM | POA: Diagnosis not present

## 2017-08-31 DIAGNOSIS — I10 Essential (primary) hypertension: Secondary | ICD-10-CM | POA: Diagnosis not present

## 2017-09-15 ENCOUNTER — Encounter: Payer: Self-pay | Admitting: Family Medicine

## 2017-09-18 DIAGNOSIS — G4733 Obstructive sleep apnea (adult) (pediatric): Secondary | ICD-10-CM | POA: Diagnosis not present

## 2017-09-20 ENCOUNTER — Other Ambulatory Visit: Payer: Self-pay | Admitting: Family Medicine

## 2017-10-01 DIAGNOSIS — I10 Essential (primary) hypertension: Secondary | ICD-10-CM | POA: Diagnosis not present

## 2017-10-01 DIAGNOSIS — G4733 Obstructive sleep apnea (adult) (pediatric): Secondary | ICD-10-CM | POA: Diagnosis not present

## 2017-10-01 DIAGNOSIS — I509 Heart failure, unspecified: Secondary | ICD-10-CM | POA: Diagnosis not present

## 2017-10-18 ENCOUNTER — Other Ambulatory Visit: Payer: Self-pay | Admitting: Family Medicine

## 2017-10-31 DIAGNOSIS — I10 Essential (primary) hypertension: Secondary | ICD-10-CM | POA: Diagnosis not present

## 2017-10-31 DIAGNOSIS — G4733 Obstructive sleep apnea (adult) (pediatric): Secondary | ICD-10-CM | POA: Diagnosis not present

## 2017-10-31 DIAGNOSIS — I509 Heart failure, unspecified: Secondary | ICD-10-CM | POA: Diagnosis not present

## 2017-12-01 DIAGNOSIS — I10 Essential (primary) hypertension: Secondary | ICD-10-CM | POA: Diagnosis not present

## 2017-12-01 DIAGNOSIS — I509 Heart failure, unspecified: Secondary | ICD-10-CM | POA: Diagnosis not present

## 2017-12-01 DIAGNOSIS — G4733 Obstructive sleep apnea (adult) (pediatric): Secondary | ICD-10-CM | POA: Diagnosis not present

## 2017-12-07 ENCOUNTER — Other Ambulatory Visit: Payer: Self-pay | Admitting: Family Medicine

## 2017-12-14 ENCOUNTER — Ambulatory Visit (INDEPENDENT_AMBULATORY_CARE_PROVIDER_SITE_OTHER): Payer: Medicare HMO | Admitting: Psychiatry

## 2017-12-14 ENCOUNTER — Encounter (HOSPITAL_COMMUNITY): Payer: Self-pay | Admitting: Psychiatry

## 2017-12-14 DIAGNOSIS — F17210 Nicotine dependence, cigarettes, uncomplicated: Secondary | ICD-10-CM

## 2017-12-14 DIAGNOSIS — G4700 Insomnia, unspecified: Secondary | ICD-10-CM

## 2017-12-14 DIAGNOSIS — R69 Illness, unspecified: Secondary | ICD-10-CM | POA: Diagnosis not present

## 2017-12-14 DIAGNOSIS — M549 Dorsalgia, unspecified: Secondary | ICD-10-CM

## 2017-12-14 DIAGNOSIS — F3162 Bipolar disorder, current episode mixed, moderate: Secondary | ICD-10-CM | POA: Diagnosis not present

## 2017-12-14 DIAGNOSIS — R450 Nervousness: Secondary | ICD-10-CM

## 2017-12-14 DIAGNOSIS — F4010 Social phobia, unspecified: Secondary | ICD-10-CM | POA: Diagnosis not present

## 2017-12-14 DIAGNOSIS — F411 Generalized anxiety disorder: Secondary | ICD-10-CM

## 2017-12-14 DIAGNOSIS — M2550 Pain in unspecified joint: Secondary | ICD-10-CM | POA: Diagnosis not present

## 2017-12-14 DIAGNOSIS — F6089 Other specific personality disorders: Secondary | ICD-10-CM

## 2017-12-14 DIAGNOSIS — F1910 Other psychoactive substance abuse, uncomplicated: Secondary | ICD-10-CM | POA: Diagnosis not present

## 2017-12-14 DIAGNOSIS — F609 Personality disorder, unspecified: Secondary | ICD-10-CM

## 2017-12-14 MED ORDER — LURASIDONE HCL 40 MG PO TABS: 40.0000 mg | ORAL_TABLET | Freq: Every day | ORAL | 1 refills | Status: DC

## 2017-12-14 MED ORDER — CLONAZEPAM 1 MG PO TABS: ORAL_TABLET | ORAL | 2 refills | Status: DC
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
## 2017-12-14 NOTE — Patient Instructions (Signed)
START latuda daily Continue clonazepam

## 2017-12-22 ENCOUNTER — Other Ambulatory Visit: Payer: Self-pay | Admitting: Family Medicine

## 2017-12-23 ENCOUNTER — Other Ambulatory Visit (HOSPITAL_COMMUNITY): Payer: Self-pay | Admitting: *Deleted

## 2017-12-24 ENCOUNTER — Telehealth (HOSPITAL_COMMUNITY): Payer: Self-pay

## 2017-12-24 MED ORDER — RISPERIDONE 1 MG PO TABS: ORAL_TABLET | ORAL | 2 refills | Status: DC

## 2017-12-24 NOTE — Telephone Encounter (Signed)
We can start him on risperidone 1 mg nightly, and increase to 2 mg nightly in about 2 weeks. It is in the same class as latuda and has the associated risks/benefits that I previously discussed with him

## 2017-12-24 NOTE — Telephone Encounter (Signed)
I sent new order to the pharmacy and called the patient and let him know.

## 2017-12-24 NOTE — Telephone Encounter (Signed)
Patient is calling to let you know that the Anette Guarneri is too expensive, he has medicare so the copay card does not work. Please review and advise, thank you

## 2017-12-25 ENCOUNTER — Ambulatory Visit (INDEPENDENT_AMBULATORY_CARE_PROVIDER_SITE_OTHER): Payer: Medicare HMO | Admitting: Licensed Clinical Social Worker

## 2017-12-25 ENCOUNTER — Ambulatory Visit: Payer: Medicare HMO | Admitting: Family Medicine

## 2017-12-25 DIAGNOSIS — F3162 Bipolar disorder, current episode mixed, moderate: Secondary | ICD-10-CM

## 2017-12-25 DIAGNOSIS — F6089 Other specific personality disorders: Secondary | ICD-10-CM

## 2017-12-25 DIAGNOSIS — F609 Personality disorder, unspecified: Secondary | ICD-10-CM

## 2017-12-25 DIAGNOSIS — R69 Illness, unspecified: Secondary | ICD-10-CM | POA: Diagnosis not present

## 2017-12-25 DIAGNOSIS — F411 Generalized anxiety disorder: Secondary | ICD-10-CM | POA: Diagnosis not present

## 2017-12-29 ENCOUNTER — Encounter (HOSPITAL_COMMUNITY): Payer: Self-pay | Admitting: Licensed Clinical Social Worker
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
## 2017-12-30 ENCOUNTER — Encounter (HOSPITAL_COMMUNITY): Payer: Self-pay

## 2017-12-30 ENCOUNTER — Ambulatory Visit (HOSPITAL_COMMUNITY)
Admission: RE | Admit: 2017-12-30 | Discharge: 2017-12-30 | Disposition: A | Discharge status: Home / Self Care | Payer: Medicare HMO | Source: Ambulatory Visit | Attending: Cardiology | Admitting: Cardiology

## 2017-12-30 VITALS — BP 126/76 | HR 81 | Wt >= 6400 oz

## 2017-12-30 DIAGNOSIS — I4891 Unspecified atrial fibrillation: Secondary | ICD-10-CM

## 2017-12-30 DIAGNOSIS — I5022 Chronic systolic (congestive) heart failure: Secondary | ICD-10-CM | POA: Diagnosis not present

## 2017-12-30 DIAGNOSIS — K219 Gastro-esophageal reflux disease without esophagitis: Secondary | ICD-10-CM | POA: Insufficient documentation

## 2017-12-30 DIAGNOSIS — Z823 Family history of stroke: Secondary | ICD-10-CM | POA: Insufficient documentation

## 2017-12-30 DIAGNOSIS — Z7982 Long term (current) use of aspirin: Secondary | ICD-10-CM | POA: Diagnosis not present

## 2017-12-30 DIAGNOSIS — I428 Other cardiomyopathies: Secondary | ICD-10-CM | POA: Diagnosis not present

## 2017-12-30 DIAGNOSIS — F17210 Nicotine dependence, cigarettes, uncomplicated: Secondary | ICD-10-CM | POA: Insufficient documentation

## 2017-12-30 DIAGNOSIS — Z86711 Personal history of pulmonary embolism: Secondary | ICD-10-CM | POA: Insufficient documentation

## 2017-12-30 DIAGNOSIS — Z888 Allergy status to other drugs, medicaments and biological substances status: Secondary | ICD-10-CM | POA: Insufficient documentation

## 2017-12-30 DIAGNOSIS — G4733 Obstructive sleep apnea (adult) (pediatric): Secondary | ICD-10-CM | POA: Diagnosis not present

## 2017-12-30 DIAGNOSIS — G43909 Migraine, unspecified, not intractable, without status migrainosus: Secondary | ICD-10-CM | POA: Diagnosis not present

## 2017-12-30 DIAGNOSIS — Z6841 Body Mass Index (BMI) 40.0 and over, adult: Secondary | ICD-10-CM | POA: Diagnosis not present

## 2017-12-30 DIAGNOSIS — Z833 Family history of diabetes mellitus: Secondary | ICD-10-CM | POA: Insufficient documentation

## 2017-12-30 DIAGNOSIS — Z8249 Family history of ischemic heart disease and other diseases of the circulatory system: Secondary | ICD-10-CM | POA: Diagnosis not present

## 2017-12-30 DIAGNOSIS — I110 Hypertensive heart disease with heart failure: Secondary | ICD-10-CM | POA: Diagnosis not present

## 2017-12-30 DIAGNOSIS — Z808 Family history of malignant neoplasm of other organs or systems: Secondary | ICD-10-CM | POA: Diagnosis not present

## 2017-12-30 DIAGNOSIS — I429 Cardiomyopathy, unspecified: Secondary | ICD-10-CM | POA: Diagnosis not present

## 2017-12-30 DIAGNOSIS — I472 Ventricular tachycardia: Secondary | ICD-10-CM | POA: Insufficient documentation

## 2017-12-30 DIAGNOSIS — Z720 Tobacco use: Secondary | ICD-10-CM

## 2017-12-30 DIAGNOSIS — R69 Illness, unspecified: Secondary | ICD-10-CM | POA: Diagnosis not present

## 2017-12-30 DIAGNOSIS — F1011 Alcohol abuse, in remission: Secondary | ICD-10-CM | POA: Insufficient documentation

## 2017-12-30 DIAGNOSIS — F419 Anxiety disorder, unspecified: Secondary | ICD-10-CM | POA: Insufficient documentation

## 2017-12-30 DIAGNOSIS — Z79899 Other long term (current) drug therapy: Secondary | ICD-10-CM | POA: Insufficient documentation

## 2017-12-30 DIAGNOSIS — I480 Paroxysmal atrial fibrillation: Secondary | ICD-10-CM

## 2017-12-30 LAB — BASIC METABOLIC PANEL
ANION GAP: 9 (ref 5–15)
BUN: 17 mg/dL (ref 6–20)
CHLORIDE: 108 mmol/L (ref 101–111)
CO2: 23 mmol/L (ref 22–32)
Calcium: 8.7 mg/dL — ABNORMAL LOW (ref 8.9–10.3)
Creatinine, Ser: 1.25 mg/dL — ABNORMAL HIGH (ref 0.61–1.24)
GFR calc non Af Amer: >60 mL/min (ref >60)
GLUCOSE: 142 mg/dL — AB (ref 65–99)
POTASSIUM: 3.8 mmol/L (ref 3.5–5.1)
Sodium: 140 mmol/L (ref 135–145)

## 2017-12-30 LAB — BRAIN NATRIURETIC PEPTIDE: B Natriuretic Peptide: 125.7 pg/mL — ABNORMAL HIGH (ref 0.0–100.0)

## 2017-12-30 MED ORDER — TORSEMIDE 20 MG PO TABS: 40.0000 mg | ORAL_TABLET | Freq: Every day | ORAL | 11 refills | Status: DC

## 2017-12-30 MED ORDER — PANTOPRAZOLE SODIUM 40 MG PO TBEC: DELAYED_RELEASE_TABLET | ORAL | 3 refills | Status: DC

## 2017-12-30 MED ORDER — SILDENAFIL CITRATE 20 MG PO TABS: 20.0000 mg | ORAL_TABLET | Freq: Every day | ORAL | 11 refills | Status: DC | PRN

## 2017-12-30 NOTE — Progress Notes (Signed)
Advanced Heart Failure Clinic Note   Primary Care:Dr. Buelah Manis, Modena Nunnery, MD Primary Cardiologist: Dr. Aundra Dubin   HPI: [name] is a 52 year old male with a past medical history of NICM, EF 25-30% in March 2018, felt to be related to prior ETOH abuse. He also has a history of PE 03/2014 completed a years course of Xarelto, morbid obesity, OSA, and tobacco abuse.   He was admitted 11/12/15-11/14/15 with acute on chronic systolic CHF and palpitations. He wore a 30 day event monitor at discharge as he had frequent PVC's and questionable Afib on telemetry. Also with some NSVT, so he was started on Amiodaone, but at follow up had not started taking it. He had previously refused ICD and was seen inpatient by Dr. Rayann Heman who felt that his morbid obesity was a prohibitive factor.   He was seen in the clinic in April 2018. He had started drinking ETOH again. Volume status was stable, he is not an Entresto candidate due to angioedema with lisinopril. Weight was 411 pounds.   Admitted 04/12/17 with SOB, chest pain. D- dimer was 1.16, chest CT without central obstructing PE, however more peripheral and subsegmental pulmonary artery branches were not confidently evaluated due to his body habitus. He was started on a heparin gtt for presumed PE, however VQ scan showed no PE. Troponin was elevated, peaked at 3.37. LHC showed no CAD. Echo showed an EF of 15%, grade 2 DD, no pericarditis. He was diuresed with IV lasix, and started on torsemide 20mg  at discharge. Discharge weight was 405 pounds.   Today he returns for HF follow up. He has not been seen in over 6 months. SOB with exertion. + Orthopnea. Denies PND. Drinking >2 liters per day. Eating out every day. Drinking 4 days a week multiple liquor drinks. Using Bipap. Continues to work at [employer]. Smokes 1 PPD .  Labs (5/13): K 4.1, creatinine 1.05 Labs (1/14): K 3.8, creatinine 1.16, BNP 54 Labs (2/14): K 3.7, creatinine 1.11, BNP 28 Labs (2/16): K 4.1,  creatinine 1.03, LDL 96, HCT 40 Labs (3/16): K 3.7, K 1.13, BNP 367 Labs (8/16): BNP 43, K 3.5, creatinine 1.01 Labs (08/13/15): K 3.7, creatinine 1.18, HCT 41.3 Labs (12/16): K 3.7, creatinine 1.14, TGs 495, digoxin < 0.2 Labs (2/17): K 3.8, creatinine 0.99, LDL 107, TGs 222 Labs (5/17): K 4, creatinine 1.47 Labs (2/18): K 3.9, creatinine 1.06, hgb 15.1, TGs 163, LDL 89, HDL 28, TSH normal  Labs (5/18): K 3.8, creatinine 1.12.  PMH: 1. Nonischemic cardiomyopathy: Prior cath with no significant CAD.  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  Prior echoes with EF as low as 25%.  Echo (9/13) with EF 35-40%, moderate to severe LV dilation, diffuse hypokinesis, mild MR. Echo (5/15) with EF 30-35%, moderate to severe LAE, normal RV size and systolic function.  Angioedema with ACEI, headaches with hydralazine/nitrates. Echo (3/16) with EF 25-30%, severe LV dilation, normal RV size and systolic function.  [facility] 08/13/15 showed no significant coronary disease; RA mean 6, PA 33/11 mean 23, PCWP mean 13, Fick CO/CI 4.75 /1.68 (difficult study, radial artery spasm, if needs future cath would use groin).  Echo (9/16) showed EF 20-25%.   - Echo (3/18): EF 25-30%, moderate LAE, Echo 4/18 EF 15%  2. HTN: angioedema with ACEI.  3. OSA: on Bipap 4. Morbid obesity 5. Paroxysmal atrial fibrillation: Not documented recently.   6. Smoker.  7. Anxiety/panic attacks 8. PE: 5/15,  diagnosed by V/Q scan. CTA chest 8/16 negative for PE.  9. NSVT, PVCs: 30 day monitor (12/16) with PVCs, PACs, no atrial fibrillation.  10. Hematuria: Apparently had negative workup by urology.  11. ABIs (6/16) were normal 12. 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  13. Gout 14. Low back pain.   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  Has son and daughter.    FH: No premature CAD.    ROS: All systems reviewed and negative except as per  HPI.   Past Medical History:  Diagnosis Date  . Alcohol abuse   . Anxiety state, unspecified   . Atrial fibrillation ([place])   . CHF (congestive heart failure) ([place])   . Chronic systolic heart failure ([place])   . Edema   . History of medication noncompliance   . Migraine   . Obesity, unspecified   . Obstructive sleep apnea   . Psychiatric disorder   . Pulmonary embolism ([place])   . Shortness of breath     Current Outpatient Medications  Medication Sig Dispense Refill  . aspirin EC 81 MG tablet Take 1 tablet (81 mg total) by mouth daily. 90 tablet 3  . clonazePAM (KLONOPIN) 1 MG tablet TAKE 1 TABLET BY MOUTH 3 TIMES DAILY AS NEEDED FOR ANXIETY 90 tablet 2  . losartan (COZAAR) 50 MG tablet Take 50 mg by mouth daily.    . magnesium oxide (MAG-OX) 400 MG tablet Take 400 mg by mouth daily as needed (cramping).    . metoprolol succinate (TOPROL-XL) 100 MG 24 hr tablet Take 100 mg by mouth daily. Take with or immediately following a meal.    . NON FORMULARY Bipap, pressure 16/12 with 2L of O2    . potassium chloride SA (K-DUR,KLOR-CON) 20 MEQ tablet Take 20 mEq by mouth daily as needed (cramping).    . risperiDONE (RISPERDAL) 1 MG tablet Take one tablet (1 mg) by mouth at bedtime for 2 weeks then increase to two tablets (2 mg) at bedtime 60 tablet 2  . spironolactone (ALDACTONE) 25 MG tablet TAKE 1 TABLET (25 MG TOTAL) BY MOUTH DAILY. 30 tablet 6  . torsemide (DEMADEX) 20 MG tablet Take 1 tablet (20 mg total) by mouth daily. 30 tablet 6  . pantoprazole (PROTONIX) 40 MG tablet TAKE 1 TABLET (40 MG TOTAL) BY MOUTH DAILY. (Patient not taking: Reported on 12/30/2017) 30 tablet 5   No current facility-administered medications for this encounter.     Allergies  Allergen Reactions  . Ace Inhibitors Anaphylaxis and Swelling  . Buspirone Other (See Comments)    dizziness      Social History   Socioeconomic History  . Marital status: Married    Spouse name: Not on file  . Number of  children: 3  . Years of education: Not on file  . Highest education level: Not on file  Social Needs  . Financial resource strain: Not on file  . Food insecurity - worry: Not on file  . Food insecurity - inability: Not on file  . Transportation needs - medical: Not on file  . Transportation needs - non-medical: Not on file  Occupational History  . Occupation: DISABLED  Tobacco Use  . Smoking status: Current Every Day Smoker    Packs/day: 0.50    Years: 30.00    Pack years: 15.00    Types: Cigarettes  . Smokeless tobacco: Never Used  . Tobacco comment: 10/2014- using patches  Substance and Sexual Activity  . Alcohol use: Yes    Alcohol/week: 0.0 oz    Comment: nothing to drink since last OV with Dr. Harl Bowie per patient  . Drug use: No  . Sexual activity: Yes  Other Topics Concern  . Not on file  Social History Narrative   He smokes about a pack per day and he has been smoking since he was 52 years of age.  He drinks alcohol occasionally, but he denies any illicit drug abuse.  He is presently on disability.    Lives with wife in a 2 story home.  Has 2 children.   Previously worked in [employer], last worked in 1998.   Highest level of education:  11th grade              Family History  Problem Relation Age of Onset  . Cancer Mother        brain tumor  . Hypertension Mother   . Diabetes Father        Deceased, 25  . Heart disease Maternal Grandmother   . Hypertension Unknown        Family History  . Stroke Unknown        Family History  . Diabetes Unknown        Family History  . Diabetes Daughter     Vitals:   12/30/17 0908  BP: 126/76  Pulse: 81  SpO2: 97%  Weight: (!) 430 lb (195 kg)   Filed Weights   12/30/17 0908  Weight: (!) 430 lb (195 kg)      PHYSICAL EXAM: General:  Appears fatigued.  No resp difficulty HEENT: normal Neck: supple.JVP 10-11. Carotids 2+ bilat; no bruits. No lymphadenopathy or thryomegaly appreciated. Cor: PMI nondisplaced.  Regular rate & rhythm. No rubs, gallops or murmurs. Lungs: clear Abdomen: obese, soft, nontender, nondistended. No hepatosplenomegaly. No bruits or masses. Good bowel sounds. Extremities: no cyanosis, clubbing, rash, R and LLE 1+ edema Neuro: alert & orientedx3, cranial nerves grossly intact. moves all 4 extremities w/o difficulty. Affect pleasant  EKG: NSR 71 bpm   ASSESSMENT & PLAN: 1. Chronic systolic CHF: Nonischemic cardiomyopathy. Echo (3/18) with EF 25-30%. He was seen by EP and decided against ICD given marked obesity.  QRS not wide enough for CRT. Echo 4/18 EF 15%. NYHA IIIb. Volume status elevated in the setting of high salt diet and excessive fluid intake. . Increase torsemide 40 mg daily.   - Continue Spiro 25 mg daily -- Continue Toprol XL 100mg -  Had side effects from digoxin, does not want to take. - Headaches with Bidil, cannot take.  2. Obesity:  Body mass index is 63.5 kg/m. Discussed portion control.  3. Smoking:  - discussed smoking cessation.  4. OSA:  - Continue nightly Bipap 5. Paroxysmal atrial fibrillation:  - Maintiaining NSR.  Has not been anticoagulated in 3 years. .  6. PE: 5/15, diagnosed by V/Q scan.  He was on Xarelto for about a year but stopped in the setting of hematuria.  - Recent hospitalization without PE on CT and VQ scan. Not on anticoagulation.  7. Anxiety - Has follow up with psychiatry.   8. GERD  Greater than 50% of the (total minutes 26) visit spent in counseling/coordination of care regarding medication changes, tobacco cessation, limiting fluid intake to < 2 liters per day, and limiting high salt food choices.      Follow up in 2 weeks.   [name], NP 12/30/17

## 2017-12-30 NOTE — Patient Instructions (Signed)
INCREASE Torsemide to 40 mg (2 tabs) once daily.  Refills sent for protonix and sildenafil.  Routine lab work today. Will notify you of abnormal results, otherwise no news is good news!  Follow up 2 weeks with Amy Clegg NP-C.  Take all medication as prescribed the day of your appointment. Bring all medications with you to your appointment.  Do the following things EVERYDAY: 1) Weigh yourself in the morning before breakfast. Write it down and keep it in a log. 2) Take your medicines as prescribed 3) Eat low salt foods-Limit salt (sodium) to 2000 mg per day.  4) Stay as active as you can everyday 5) Limit all fluids for the day to less than 2 liters

## 2018-01-01 DIAGNOSIS — I10 Essential (primary) hypertension: Secondary | ICD-10-CM | POA: Diagnosis not present

## 2018-01-01 DIAGNOSIS — G4733 Obstructive sleep apnea (adult) (pediatric): Secondary | ICD-10-CM | POA: Diagnosis not present

## 2018-01-01 DIAGNOSIS — I509 Heart failure, unspecified: Secondary | ICD-10-CM | POA: Diagnosis not present

## 2018-01-12 ENCOUNTER — Ambulatory Visit (HOSPITAL_COMMUNITY): Payer: Self-pay | Admitting: Licensed Clinical Social Worker

## 2018-01-14 ENCOUNTER — Encounter (HOSPITAL_COMMUNITY): Payer: Self-pay

## 2018-01-14 ENCOUNTER — Ambulatory Visit (HOSPITAL_COMMUNITY)
Admission: RE | Admit: 2018-01-14 | Discharge: 2018-01-14 | Disposition: A | Discharge status: Home / Self Care | Payer: Medicare HMO | Source: Ambulatory Visit | Attending: Internal Medicine | Admitting: Internal Medicine

## 2018-01-14 VITALS — BP 144/88 | HR 75 | Wt >= 6400 oz

## 2018-01-14 DIAGNOSIS — F1021 Alcohol dependence, in remission: Secondary | ICD-10-CM | POA: Insufficient documentation

## 2018-01-14 DIAGNOSIS — F419 Anxiety disorder, unspecified: Secondary | ICD-10-CM | POA: Diagnosis not present

## 2018-01-14 DIAGNOSIS — F17200 Nicotine dependence, unspecified, uncomplicated: Secondary | ICD-10-CM | POA: Diagnosis not present

## 2018-01-14 DIAGNOSIS — I429 Cardiomyopathy, unspecified: Secondary | ICD-10-CM | POA: Insufficient documentation

## 2018-01-14 DIAGNOSIS — I5022 Chronic systolic (congestive) heart failure: Secondary | ICD-10-CM | POA: Diagnosis not present

## 2018-01-14 DIAGNOSIS — I480 Paroxysmal atrial fibrillation: Secondary | ICD-10-CM | POA: Insufficient documentation

## 2018-01-14 DIAGNOSIS — Z6841 Body Mass Index (BMI) 40.0 and over, adult: Secondary | ICD-10-CM | POA: Diagnosis not present

## 2018-01-14 DIAGNOSIS — Z86711 Personal history of pulmonary embolism: Secondary | ICD-10-CM | POA: Insufficient documentation

## 2018-01-14 DIAGNOSIS — F17210 Nicotine dependence, cigarettes, uncomplicated: Secondary | ICD-10-CM | POA: Insufficient documentation

## 2018-01-14 DIAGNOSIS — Z7982 Long term (current) use of aspirin: Secondary | ICD-10-CM | POA: Insufficient documentation

## 2018-01-14 DIAGNOSIS — I110 Hypertensive heart disease with heart failure: Secondary | ICD-10-CM | POA: Insufficient documentation

## 2018-01-14 DIAGNOSIS — G4733 Obstructive sleep apnea (adult) (pediatric): Secondary | ICD-10-CM | POA: Diagnosis not present

## 2018-01-14 DIAGNOSIS — R69 Illness, unspecified: Secondary | ICD-10-CM | POA: Diagnosis not present

## 2018-01-14 DIAGNOSIS — Z79899 Other long term (current) drug therapy: Secondary | ICD-10-CM | POA: Insufficient documentation

## 2018-01-14 DIAGNOSIS — R51 Headache: Secondary | ICD-10-CM | POA: Diagnosis present

## 2018-01-14 DIAGNOSIS — I428 Other cardiomyopathies: Secondary | ICD-10-CM

## 2018-01-14 MED ORDER — TORSEMIDE 20 MG PO TABS: 40.0000 mg | ORAL_TABLET | Freq: Every day | ORAL | 11 refills | Status: DC

## 2018-01-14 MED ORDER — POTASSIUM CHLORIDE CRYS ER 20 MEQ PO TBCR: 20.0000 meq | EXTENDED_RELEASE_TABLET | Freq: Every day | ORAL | 11 refills | Status: DC

## 2018-01-14 NOTE — Patient Instructions (Addendum)
INCREASE Torsemide to 40 mg (2 tabs) once daily.  START Potassium 20 meq (1 tab) once daily.  Will refer you to our Heart Failure Paramedicine Program. This program is designed to help support you at home with medication management, vital sign and weight measurements, education about heart failure, and any other needs to support you. A certified EMT will call you to set up your initial appointment in the home, and will be available to you at a weekly basis, free of charge.  Follow up 2 weeks with Amy Clegg NP-C.  ______________________________________________________________ [name] Code:  Take all medication as prescribed the day of your appointment. Bring all medications with you to your appointment.  Do the following things EVERYDAY: 1) Weigh yourself in the morning before breakfast. Write it down and keep it in a log. 2) Take your medicines as prescribed 3) Eat low salt foods-Limit salt (sodium) to 2000 mg per day.  4) Stay as active as you can everyday 5) Limit all fluids for the day to less than 2 liters

## 2018-01-14 NOTE — Progress Notes (Signed)
Advanced Heart Failure Clinic Note   Primary Care:Dr. Buelah Manis, [place] Nunnery, MD Primary Cardiologist: Dr. Aundra Dubin   HPI: [name] is a 52 year old male with a past medical history of NICM, EF 25-30% in March 2018, felt to be related to prior ETOH abuse. He also has a history of PE 03/2014 completed a years course of Xarelto, morbid obesity, OSA, and tobacco abuse.   He was admitted 11/12/15-11/14/15 with acute on chronic systolic CHF and palpitations. He wore a 30 day event monitor at discharge as he had frequent PVC's and questionable Afib on telemetry. Also with some NSVT, so he was started on Amiodaone, but at follow up had not started taking it. He had previously refused ICD and was seen inpatient by Dr. Rayann Heman who felt that his morbid obesity was a prohibitive factor.   He was seen in the clinic in April 2018. He had started drinking ETOH again. Volume status was stable, he is not an Entresto candidate due to angioedema with lisinopril. Weight was 411 pounds.   Admitted 04/12/17 with SOB, chest pain. D- dimer was 1.16, chest CT without central obstructing PE, however more peripheral and subsegmental pulmonary artery branches were not confidently evaluated due to his body habitus. He was started on a heparin gtt for presumed PE, however VQ scan showed no PE. Troponin was elevated, peaked at 3.37. LHC showed no CAD. Echo showed an EF of 15%, grade 2 DD, no pericarditis. He was diuresed with IV lasix, and started on torsemide 20mg  at discharge. Discharge weight was 405 pounds.   Today he returns for HF follow up . Last visit torsemide was increased to 40 mg daily however he has been taking 30 mg daily. Overall feeling fine. SOB min activity. Denies PND/Orthopnea. Appetite ok. Has a large appetite. Not following low salt diet. Trying to cut back alcohol. No fever or chills. Not weighing at home. Smoking 1PPD. Taking all medications but did not take medication before he came to the appointment.    Labs  (5/13): K 4.1, creatinine 1.05 Labs (1/14): K 3.8, creatinine 1.16, BNP 54 Labs (2/14): K 3.7, creatinine 1.11, BNP 28 Labs (2/16): K 4.1, creatinine 1.03, LDL 96, HCT 40 Labs (3/16): K 3.7, K 1.13, BNP 367 Labs (8/16): BNP 43, K 3.5, creatinine 1.01 Labs (08/13/15): K 3.7, creatinine 1.18, HCT 41.3 Labs (12/16): K 3.7, creatinine 1.14, TGs 495, digoxin < 0.2 Labs (2/17): K 3.8, creatinine 0.99, LDL 107, TGs 222 Labs (5/17): K 4, creatinine 1.47 Labs (2/18): K 3.9, creatinine 1.06, hgb 15.1, TGs 163, LDL 89, HDL 28, TSH normal  Labs (5/18): K 3.8, creatinine 1.12.  Labs (12/30/2017): K 3.8 Creatinine 1.25   PMH: 1. Nonischemic cardiomyopathy: Prior cath with no significant CAD.  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  Prior echoes with EF as low as 25%.  Echo (9/13) with EF 35-40%, moderate to severe LV dilation, diffuse hypokinesis, mild MR. Echo (5/15) with EF 30-35%, moderate to severe LAE, normal RV size and systolic function.  Angioedema with ACEI, headaches with hydralazine/nitrates. Echo (3/16) with EF 25-30%, severe LV dilation, normal RV size and systolic function.  [facility] 08/13/15 showed no significant coronary disease; RA mean 6, PA 33/11 mean 23, PCWP mean 13, Fick CO/CI 4.75 /1.68 (difficult study, radial artery spasm, if needs future cath would use groin).  Echo (9/16) showed EF 20-25%.   - Echo (3/18): EF 25-30%, moderate LAE, Echo 4/18 EF 15%  2. HTN: angioedema with ACEI.  3. OSA: on Bipap 4. Morbid obesity 5. Paroxysmal atrial fibrillation: Not documented recently.   6. Smoker.  7. Anxiety/panic attacks 8. PE: 5/15, diagnosed by V/Q scan. CTA chest 8/16 negative for PE.  9. NSVT, PVCs: 30 day monitor (12/16) with PVCs, PACs, no atrial fibrillation.  10. Hematuria: Apparently had negative workup by urology.  11. ABIs (6/16) were normal 12. 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  13. Gout 14. Low back pain.   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  Has son and daughter.    FH: No premature CAD.    ROS: All systems reviewed and negative except as per HPI.   Past Medical History:  Diagnosis Date  . Alcohol abuse   . Anxiety state, unspecified   . Atrial fibrillation ([place])   . CHF (congestive heart failure) ([place])   . Chronic systolic heart failure ([place])   . Edema   . History of medication noncompliance   . Migraine   . Obesity, unspecified   . Obstructive sleep apnea   . Psychiatric disorder   . Pulmonary embolism ([place])   . Shortness of breath     Current Outpatient Medications  Medication Sig Dispense Refill  . aspirin EC 81 MG tablet Take 1 tablet (81 mg total) by mouth daily. 90 tablet 3  . clonazePAM (KLONOPIN) 1 MG tablet TAKE 1 TABLET BY MOUTH 3 TIMES DAILY AS NEEDED FOR ANXIETY 90 tablet 2  . losartan (COZAAR) 50 MG tablet Take 50 mg by mouth daily.    . magnesium oxide (MAG-OX) 400 MG tablet Take 400 mg by mouth daily as needed (cramping).    . metoprolol succinate (TOPROL-XL) 100 MG 24 hr tablet Take 100 mg by mouth daily. Take with or immediately following a meal.    . NON FORMULARY Bipap, pressure 16/12 with 2L of O2    . pantoprazole (PROTONIX) 40 MG tablet TAKE 1 TABLET (40 MG TOTAL) BY MOUTH DAILY. 90 tablet 3  . potassium chloride SA (K-DUR,KLOR-CON) 20 MEQ tablet Take 20 mEq by mouth daily as needed (cramping).    . risperiDONE (RISPERDAL) 1 MG tablet Take one tablet (1 mg) by mouth at bedtime for 2 weeks then increase to two tablets (2 mg) at bedtime 60 tablet 2  . sildenafil (REVATIO) 20 MG tablet Take 1 tablet (20 mg total) by mouth daily as needed. 15 tablet 11  . spironolactone (ALDACTONE) 25 MG tablet TAKE 1 TABLET (25 MG TOTAL) BY MOUTH DAILY. 30 tablet 6  . torsemide (DEMADEX) 20 MG tablet Take 30 mg by mouth daily.     No current facility-administered medications for this encounter.     Allergies  Allergen Reactions  .  Ace Inhibitors Anaphylaxis and Swelling  . Buspirone Other (See Comments)    dizziness      Social History   Socioeconomic History  . Marital status: Married    Spouse name: Not on file  . Number of children: 3  . Years of education: Not on file  . Highest education level: Not on file  Social Needs  . Financial resource strain: Not on file  . Food insecurity - worry: Not on file  . Food insecurity - inability: Not on file  . Transportation needs - medical: Not on file  . Transportation needs - non-medical: Not on file  Occupational History  . Occupation: DISABLED  Tobacco Use  .  Smoking status: Current Every Day Smoker    Packs/day: 0.50    Years: 30.00    Pack years: 15.00    Types: Cigarettes  . Smokeless tobacco: Never Used  . Tobacco comment: 10/2014- using patches  Substance and Sexual Activity  . Alcohol use: Yes    Alcohol/week: 0.0 oz    Comment: nothing to drink since last OV with Dr. Harl Bowie per patient  . Drug use: No  . Sexual activity: Yes  Other Topics Concern  . Not on file  Social History Narrative   He smokes about a pack per day and he has been smoking since he was 52 years of age.  He drinks alcohol occasionally, but he denies any illicit drug abuse.  He is presently on disability.    Lives with wife in a 2 story home.  Has 2 children.   Previously worked in [employer], last worked in 1998.   Highest level of education:  11th grade              Family History  Problem Relation Age of Onset  . Cancer Mother        brain tumor  . Hypertension Mother   . Diabetes Father        Deceased, 69  . Heart disease Maternal Grandmother   . Hypertension Unknown        Family History  . Stroke Unknown        Family History  . Diabetes Unknown        Family History  . Diabetes Daughter     Vitals:   01/14/18 1144  BP: (!) 144/88  Pulse: 75  SpO2: 99%  Weight: (!) 437 lb 12.8 oz (198.6 kg)   Filed Weights   01/14/18 1144  Weight: (!) 437 lb  12.8 oz (198.6 kg)      PHYSICAL EXAM: General:  Well appearing. No resp difficulty HEENT: normal Neck: supple. JVP ~10 no JVD. Carotids 2+ bilat; no bruits. No lymphadenopathy or thryomegaly appreciated. Cor: PMI nondisplaced. Regular rate & rhythm. No rubs, gallops or murmurs. Lungs: clear Abdomen: obese, soft, nontender, nondistended. No hepatosplenomegaly. No bruits or masses. Good bowel sounds. Extremities: no cyanosis, clubbing, rash, R and LLE 1+ edema Neuro: alert & orientedx3, cranial nerves grossly intact. moves all 4 extremities w/o difficulty. Affect pleasant  ASSESSMENT & PLAN: 1. Chronic systolic CHF: Nonischemic cardiomyopathy. Echo (3/18) with EF 25-30%. He was seen by EP and decided against ICD given marked obesity.  QRS not wide enough for CRT. Echo 4/18 EF 15%. NYHA IIIb. Volume status elevated. Increase torsemide 40 mg daily.  Add 20 meq potassium.  - Continue Spiro 25 mg daily -- Continue Toprol XL 100mg  Had side effects from digoxin, does not want to take. - Headaches with Bidil, cannot take.  2. Obesity:  Body mass index is 64.65 kg/m.  Discussed portion control.  3. Smoking:  - discussed smoking cessation.  4. OSA:  - Continue nightly Bipap 5. Paroxysmal atrial fibrillation:  - Maintiaining NSR.  Has not been anticoagulated in 3 years.  6. PE: 5/15, diagnosed by V/Q scan.  He was on Xarelto for about a year but stopped in the setting of hematuria.  - Recent hospitalization without PE on CT and VQ scan. Not on anticoagulation. Does not want to  7. Anxiety - Has follow up with psychiatry.   8. GERD  Follow up in 2 weeks. Check BMET today.  Greater than 50% of the (total minutes  25) visit spent in counseling/coordination of care regarding low salt food choices and medication changes. Refer to Paramedicine.     [name], NP 01/14/18

## 2018-01-19 ENCOUNTER — Ambulatory Visit (HOSPITAL_COMMUNITY): Payer: Self-pay | Admitting: Licensed Clinical Social Worker

## 2018-01-20 ENCOUNTER — Telehealth: Payer: Self-pay | Admitting: Internal Medicine

## 2018-01-20 NOTE — Telephone Encounter (Signed)
[name], would you be okay seeing this patient to establish care?

## 2018-01-20 NOTE — Telephone Encounter (Signed)
Yes, I will see him.

## 2018-01-20 NOTE — Telephone Encounter (Signed)
Copied from [place] [id number]. Topic: Appointment Scheduling - Prior Auth Required for Appointment >> Jan 20, 2018  1:06 PM [name], [place] C wrote: No appointment has been scheduled. Patient is requesting to establish care with Dr. Ronnald Ramp if possible. Pt says that he sees pulmonary in the office and also behavioral medicine in the [place] building. Pt says that he think that Dr. Ronnald Ramp would be a good fit. Please advise on scheduling.    Per scheduling protocol, this appointment requires a prior authorization prior to scheduling.  Route to department's PEC pool.

## 2018-01-20 NOTE — Telephone Encounter (Signed)
Appointment scheduled.

## 2018-01-28 ENCOUNTER — Encounter (HOSPITAL_COMMUNITY): Payer: Self-pay

## 2018-01-28 ENCOUNTER — Ambulatory Visit (HOSPITAL_COMMUNITY)
Admission: RE | Admit: 2018-01-28 | Discharge: 2018-01-28 | Disposition: A | Discharge status: Home / Self Care | Payer: Medicare HMO | Source: Ambulatory Visit | Attending: Internal Medicine | Admitting: Internal Medicine

## 2018-01-28 VITALS — BP 120/76 | HR 78 | Wt >= 6400 oz

## 2018-01-28 DIAGNOSIS — I428 Other cardiomyopathies: Secondary | ICD-10-CM

## 2018-01-28 DIAGNOSIS — R0602 Shortness of breath: Secondary | ICD-10-CM | POA: Diagnosis not present

## 2018-01-28 DIAGNOSIS — G4733 Obstructive sleep apnea (adult) (pediatric): Secondary | ICD-10-CM | POA: Diagnosis not present

## 2018-01-28 DIAGNOSIS — R51 Headache: Secondary | ICD-10-CM | POA: Insufficient documentation

## 2018-01-28 DIAGNOSIS — I472 Ventricular tachycardia: Secondary | ICD-10-CM | POA: Diagnosis not present

## 2018-01-28 DIAGNOSIS — R69 Illness, unspecified: Secondary | ICD-10-CM | POA: Diagnosis not present

## 2018-01-28 DIAGNOSIS — I429 Cardiomyopathy, unspecified: Secondary | ICD-10-CM | POA: Diagnosis present

## 2018-01-28 DIAGNOSIS — I110 Hypertensive heart disease with heart failure: Secondary | ICD-10-CM | POA: Insufficient documentation

## 2018-01-28 DIAGNOSIS — F17210 Nicotine dependence, cigarettes, uncomplicated: Secondary | ICD-10-CM | POA: Diagnosis not present

## 2018-01-28 DIAGNOSIS — Z79899 Other long term (current) drug therapy: Secondary | ICD-10-CM | POA: Insufficient documentation

## 2018-01-28 DIAGNOSIS — F17200 Nicotine dependence, unspecified, uncomplicated: Secondary | ICD-10-CM

## 2018-01-28 DIAGNOSIS — I480 Paroxysmal atrial fibrillation: Secondary | ICD-10-CM

## 2018-01-28 DIAGNOSIS — Z6841 Body Mass Index (BMI) 40.0 and over, adult: Secondary | ICD-10-CM | POA: Insufficient documentation

## 2018-01-28 DIAGNOSIS — Z86711 Personal history of pulmonary embolism: Secondary | ICD-10-CM | POA: Insufficient documentation

## 2018-01-28 DIAGNOSIS — Z720 Tobacco use: Secondary | ICD-10-CM | POA: Diagnosis not present

## 2018-01-28 DIAGNOSIS — F419 Anxiety disorder, unspecified: Secondary | ICD-10-CM | POA: Diagnosis not present

## 2018-01-28 DIAGNOSIS — Z7982 Long term (current) use of aspirin: Secondary | ICD-10-CM | POA: Insufficient documentation

## 2018-01-28 DIAGNOSIS — F1010 Alcohol abuse, uncomplicated: Secondary | ICD-10-CM | POA: Diagnosis not present

## 2018-01-28 DIAGNOSIS — F410 Panic disorder [episodic paroxysmal anxiety] without agoraphobia: Secondary | ICD-10-CM | POA: Insufficient documentation

## 2018-01-28 DIAGNOSIS — I5022 Chronic systolic (congestive) heart failure: Secondary | ICD-10-CM

## 2018-01-28 LAB — BASIC METABOLIC PANEL
ANION GAP: 10 (ref 5–15)
BUN: 12 mg/dL (ref 6–20)
CALCIUM: 8.9 mg/dL (ref 8.9–10.3)
CO2: 23 mmol/L (ref 22–32)
Chloride: 103 mmol/L (ref 101–111)
Creatinine, Ser: 1.08 mg/dL (ref 0.61–1.24)
Glucose, Bld: 188 mg/dL — ABNORMAL HIGH (ref 65–99)
Potassium: 3.8 mmol/L (ref 3.5–5.1)
Sodium: 136 mmol/L (ref 135–145)

## 2018-01-28 LAB — BRAIN NATRIURETIC PEPTIDE: B Natriuretic Peptide: 69.6 pg/mL (ref 0.0–100.0)

## 2018-01-28 MED ORDER — TORSEMIDE 20 MG PO TABS: 40.0000 mg | ORAL_TABLET | Freq: Every day | ORAL | 11 refills | Status: DC

## 2018-01-28 MED ORDER — LOSARTAN POTASSIUM 50 MG PO TABS: 50.0000 mg | ORAL_TABLET | Freq: Two times a day (BID) | ORAL | 11 refills | Status: DC

## 2018-01-28 NOTE — Progress Notes (Signed)
Advanced Heart Failure Clinic Note   Primary Care:[name], Adam Nunnery, [name] Primary Cardiologist: [name]   HPI: [name] is a 52 year old male with a past medical history of NICM, EF 25-30% in March 2018, felt to be related to prior ETOH abuse. He also has a history of PE 03/2014 completed a years course of Xarelto, morbid obesity, OSA, and tobacco abuse.   He was admitted 11/12/15-11/14/15 with acute on chronic systolic CHF and palpitations. He wore a 30 day event monitor at discharge as he had frequent PVC's and questionable Afib on telemetry. Also with some NSVT, so he was started on Amiodaone, but at follow up had not started taking it. He had previously refused ICD and was seen inpatient by [name] who felt that his morbid obesity was a prohibitive factor.   He was seen in the clinic in April 2018. He had started drinking ETOH again. Volume status was stable, he is not an Entresto candidate due to angioedema with lisinopril. Weight was 411 pounds.   Admitted 04/12/17 with SOB, chest pain. D- dimer was 1.16, chest CT without central obstructing PE, however more peripheral and subsegmental pulmonary artery branches were not confidently evaluated due to his body habitus. He was started on a heparin gtt for presumed PE, however VQ scan showed no PE. Troponin was elevated, peaked at 3.37. LHC showed no CAD. Echo showed an EF of 15%, grade 2 DD, no pericarditis. He was diuresed with IV lasix, and started on torsemide 20mg  at discharge. Discharge weight was 405 pounds.   Today he returns for HF follow up. Overall feeling fair. Complaining of fatigue. Says he is not moving around much. SOB with exertion. Denies PND. + Orthopnea. Eating fast food and drinking extra fluids.  No fever or chills. Not weighing at home. Taking all medications but rarely take torsemide as ordered. Says he takes 1 torsemide or 1 1/2 daily.Not using bipap.  Has not been drinking alcohol.    Labs (5/13): K 4.1,  creatinine 1.05 Labs (1/14): K 3.8, creatinine 1.16, BNP 54 Labs (2/14): K 3.7, creatinine 1.11, BNP 28 Labs (2/16): K 4.1, creatinine 1.03, LDL 96, HCT 40 Labs (3/16): K 3.7, K 1.13, BNP 367 Labs (8/16): BNP 43, K 3.5, creatinine 1.01 Labs (08/13/15): K 3.7, creatinine 1.18, HCT 41.3 Labs (12/16): K 3.7, creatinine 1.14, TGs 495, digoxin < 0.2 Labs (2/17): K 3.8, creatinine 0.99, LDL 107, TGs 222 Labs (5/17): K 4, creatinine 1.47 Labs (2/18): K 3.9, creatinine 1.06, hgb 15.1, TGs 163, LDL 89, HDL 28, TSH normal  Labs (5/18): K 3.8, creatinine 1.12.  Labs (12/30/2017): K 3.8 Creatinine 1.25   PMH: 1. Nonischemic cardiomyopathy: Prior cath with no significant CAD.  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  Prior echoes with EF as low as 25%.  Echo (9/13) with EF 35-40%, moderate to severe LV dilation, diffuse hypokinesis, mild MR. Echo (5/15) with EF 30-35%, moderate to severe LAE, normal RV size and systolic function.  Angioedema with ACEI, headaches with hydralazine/nitrates. Echo (3/16) with EF 25-30%, severe LV dilation, normal RV size and systolic function.  [name] 08/13/15 showed no significant coronary disease; RA mean 6, PA 33/11 mean 23, PCWP mean 13, Fick CO/CI 4.75 /1.68 (difficult study, radial artery spasm, if needs future cath would use groin).  Echo (9/16) showed EF 20-25%.   - Echo (3/18): EF 25-30%, moderate LAE, Echo 4/18 EF 15%  2. HTN: angioedema with  ACEI.  3. OSA: on Bipap 4. Morbid obesity 5. Paroxysmal atrial fibrillation: Not documented recently.   6. Smoker.  7. Anxiety/panic attacks 8. PE: 5/15, diagnosed by V/Q scan. CTA chest 8/16 negative for PE.  9. NSVT, PVCs: 30 day monitor (12/16) with PVCs, PACs, no atrial fibrillation.  10. Hematuria: Apparently had negative workup by urology.  11. ABIs (6/16) were normal 12. 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  13. Gout 14. Low back pain.   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  Has son and daughter.    FH: No premature CAD.    ROS: All systems reviewed and negative except as per HPI.   Past Medical History:  Diagnosis Date  . Alcohol abuse   . Anxiety state, unspecified   . Atrial fibrillation ([name])   . CHF (congestive heart failure) ([name])   . Chronic systolic heart failure ([name])   . Edema   . History of medication noncompliance   . Migraine   . Obesity, unspecified   . Obstructive sleep apnea   . Psychiatric disorder   . Pulmonary embolism ([name])   . Shortness of breath     Current Outpatient Medications  Medication Sig Dispense Refill  . aspirin EC 81 MG tablet Take 1 tablet (81 mg total) by mouth daily. 90 tablet 3  . clonazePAM (KLONOPIN) 1 MG tablet TAKE 1 TABLET BY MOUTH 3 TIMES DAILY AS NEEDED FOR ANXIETY 90 tablet 2  . losartan (COZAAR) 50 MG tablet Take 50 mg by mouth daily.    . magnesium oxide (MAG-OX) 400 MG tablet Take 400 mg by mouth daily as needed (cramping).    . metoprolol succinate (TOPROL-XL) 100 MG 24 hr tablet Take 100 mg by mouth daily. Take with or immediately following a meal.    . NON FORMULARY Bipap, pressure 16/12 with 2L of O2    . pantoprazole (PROTONIX) 40 MG tablet TAKE 1 TABLET (40 MG TOTAL) BY MOUTH DAILY. 90 tablet 3  . potassium chloride SA (K-DUR,KLOR-CON) 20 MEQ tablet Take 20 mEq by mouth daily as needed (cramping).    . risperiDONE (RISPERDAL) 1 MG tablet Take one tablet (1 mg) by mouth at bedtime for 2 weeks then increase to two tablets (2 mg) at bedtime 60 tablet 2  . sildenafil (REVATIO) 20 MG tablet Take 1 tablet (20 mg total) by mouth daily as needed. 15 tablet 11  . spironolactone (ALDACTONE) 25 MG tablet TAKE 1 TABLET (25 MG TOTAL) BY MOUTH DAILY. 30 tablet 6  . torsemide (DEMADEX) 20 MG tablet Take 2 tablets (40 mg total) by mouth daily. 60 tablet 11   No current facility-administered medications for this encounter.     Allergies  Allergen  Reactions  . Ace Inhibitors Anaphylaxis and Swelling  . Buspirone Other (See Comments)    dizziness      Social History   Socioeconomic History  . Marital status: Married    Spouse name: Not on file  . Number of children: 3  . Years of education: Not on file  . Highest education level: Not on file  Social Needs  . Financial resource strain: Not on file  . Food insecurity - worry: Not on file  . Food insecurity - inability: Not on file  . Transportation needs - medical: Not on file  . Transportation needs - non-medical: Not on file  Occupational History  . Occupation: DISABLED  Tobacco Use  .  Smoking status: Current Every Day Smoker    Packs/day: 0.50    Years: 30.00    Pack years: 15.00    Types: Cigarettes  . Smokeless tobacco: Never Used  . Tobacco comment: 10/2014- using patches  Substance and Sexual Activity  . Alcohol use: Yes    Alcohol/week: 0.0 oz    Comment: nothing to drink since last OV with Dr. Harl Bowie per patient  . Drug use: No  . Sexual activity: Yes  Other Topics Concern  . Not on file  Social History Narrative   He smokes about a pack per day and he has been smoking since he was 52 years of age.  He drinks alcohol occasionally, but he denies any illicit drug abuse.  He is presently on disability.    Lives with wife in a 2 story home.  Has 2 children.   Previously worked in [employer], last worked in 1998.   Highest level of education:  11th grade              Family History  Problem Relation Age of Onset  . Cancer Mother        brain tumor  . Hypertension Mother   . Diabetes Father        Deceased, 30  . Heart disease Maternal Grandmother   . Hypertension Unknown        Family History  . Stroke Unknown        Family History  . Diabetes Unknown        Family History  . Diabetes Daughter     Vitals:   01/28/18 0943  BP: 120/76  Pulse: 78  SpO2: 98%  Weight: (!) 437 lb 9.6 oz (198.5 kg)   Filed Weights   01/28/18 0943  Weight: (!)  437 lb 9.6 oz (198.5 kg)      PHYSICAL EXAM: General:  No resp difficulty HEENT: normal Neck: supple. JVP ~10. Carotids 2+ bilat; no bruits. No lymphadenopathy or thryomegaly appreciated. Cor: PMI nondisplaced. Regular rate & rhythm. No rubs, gallops or murmurs. Lungs: clear Abdomen: obese, soft, nontender, nondistended. No hepatosplenomegaly. No bruits or masses. Good bowel sounds. Extremities: no cyanosis, clubbing, rash, R and LLE 1+ edema Neuro: alert & orientedx3, cranial nerves grossly intact. moves all 4 extremities w/o difficulty. Affect pleasant  ASSESSMENT & PLAN: 1. Chronic systolic CHF: Nonischemic cardiomyopathy. Echo (3/18) with EF 25-30%. He was seen by EP and decided against ICD given marked obesity.  QRS not wide enough for CRT. Echo 4/18 EF 15%. NYHA IIIb. Volume status elevated In the setting of medications and diet.  -Increase torsemide to 40 mg daily.  - Continue Spiro 25 mg daily - Continue Toprol XL 100mg  -Increase losartan to 100 mg daily  Had side effects from digoxin, does not want to take. - Headaches with Bidil, cannot take.  2. Obesity:  Body mass index is 64.62 kg/m.  Discussed portion control. He is considering weight loss surgery and would like additional information. He is going to set up a meeting.  3. Smoking:  - discussed smoking cessation.  4. OSA:  - Reinforced daily use.  5. Paroxysmal atrial fibrillation:  - Maintiaining NSR.  Has not been anticoagulated in 3 years.  6. PE: 5/15, diagnosed by V/Q scan.  He was on Xarelto for about a year but stopped in the setting of hematuria.  - Recent hospitalization without PE on CT and VQ scan. Not on anticoagulation. Does not want to anticoagulants.  7. Anxiety -  Has follow up with psychiatry.   8. GERD  Follow up in 3-4 weeks. He was referred to paramedicine. Check BMET in 10 days.   [name], NP 01/28/18

## 2018-01-28 NOTE — Patient Instructions (Addendum)
Routine lab work today. Will notify you of abnormal results, otherwise no news is good news!  INCREASE Losartan to 50 mg TWICE daily.  INCREASE Torsemide to 40 mg (2 tabs) once daily.  Follow up 4-6 weeks with Amy Clegg NP-C. MARCH 28TH AT 10:30 GARAGE CODE: 9002  Visit the following website on information and registration for our Bariatric Program! [email or web address]  Take all medication as prescribed the day of your appointment. Bring all medications with you to your appointment.  Do the following things EVERYDAY: 1) Weigh yourself in the morning before breakfast. Write it down and keep it in a log. 2) Take your medicines as prescribed 3) Eat low salt foods-Limit salt (sodium) to 2000 mg per day.  4) Stay as active as you can everyday 5) Limit all fluids for the day to less than 2 liters

## 2018-01-29 ENCOUNTER — Telehealth (HOSPITAL_COMMUNITY): Payer: Self-pay | Admitting: Surgery

## 2018-01-29 DIAGNOSIS — G4733 Obstructive sleep apnea (adult) (pediatric): Secondary | ICD-10-CM | POA: Diagnosis not present

## 2018-01-29 DIAGNOSIS — I509 Heart failure, unspecified: Secondary | ICD-10-CM | POA: Diagnosis not present

## 2018-01-29 DIAGNOSIS — I10 Essential (primary) hypertension: Secondary | ICD-10-CM | POA: Diagnosis not present

## 2018-01-29 NOTE — Telephone Encounter (Signed)
Patient has been referred to the [facility].  The appropriate paperwork has been sent via secure email to the paramedic team. 

## 2018-02-02 ENCOUNTER — Ambulatory Visit (INDEPENDENT_AMBULATORY_CARE_PROVIDER_SITE_OTHER): Payer: Medicare HMO | Admitting: Internal Medicine

## 2018-02-02 ENCOUNTER — Encounter: Payer: Self-pay | Admitting: Internal Medicine

## 2018-02-02 ENCOUNTER — Other Ambulatory Visit (INDEPENDENT_AMBULATORY_CARE_PROVIDER_SITE_OTHER): Payer: Medicare HMO

## 2018-02-02 ENCOUNTER — Ambulatory Visit (INDEPENDENT_AMBULATORY_CARE_PROVIDER_SITE_OTHER)
Admission: RE | Admit: 2018-02-02 | Discharge: 2018-02-02 | Disposition: A | Discharge status: Home / Self Care | Payer: Medicare HMO | Source: Ambulatory Visit | Attending: Internal Medicine | Admitting: Internal Medicine

## 2018-02-02 VITALS — BP 124/60 | HR 71 | Temp 97.5°F | Resp 20 | Ht 69.0 in | Wt >= 6400 oz

## 2018-02-02 DIAGNOSIS — Z0000 Encounter for general adult medical examination without abnormal findings: Secondary | ICD-10-CM

## 2018-02-02 DIAGNOSIS — I480 Paroxysmal atrial fibrillation: Secondary | ICD-10-CM

## 2018-02-02 DIAGNOSIS — E785 Hyperlipidemia, unspecified: Secondary | ICD-10-CM

## 2018-02-02 DIAGNOSIS — R05 Cough: Secondary | ICD-10-CM

## 2018-02-02 DIAGNOSIS — M1000 Idiopathic gout, unspecified site: Secondary | ICD-10-CM

## 2018-02-02 DIAGNOSIS — J988 Other specified respiratory disorders: Secondary | ICD-10-CM

## 2018-02-02 DIAGNOSIS — E118 Type 2 diabetes mellitus with unspecified complications: Secondary | ICD-10-CM | POA: Diagnosis not present

## 2018-02-02 DIAGNOSIS — N400 Enlarged prostate without lower urinary tract symptoms: Secondary | ICD-10-CM | POA: Diagnosis not present

## 2018-02-02 DIAGNOSIS — I10 Essential (primary) hypertension: Secondary | ICD-10-CM | POA: Diagnosis not present

## 2018-02-02 DIAGNOSIS — R8281 Pyuria: Secondary | ICD-10-CM

## 2018-02-02 DIAGNOSIS — R059 Cough, unspecified: Secondary | ICD-10-CM

## 2018-02-02 DIAGNOSIS — R0602 Shortness of breath: Secondary | ICD-10-CM | POA: Diagnosis not present

## 2018-02-02 DIAGNOSIS — N390 Urinary tract infection, site not specified: Secondary | ICD-10-CM | POA: Diagnosis not present

## 2018-02-02 LAB — CBC WITH DIFFERENTIAL/PLATELET
BASOS PCT: 1.4 % (ref 0.0–3.0)
Basophils Absolute: 0.1 10*3/uL (ref 0.0–0.1)
EOS PCT: 2.7 % (ref 0.0–5.0)
Eosinophils Absolute: 0.3 10*3/uL (ref 0.0–0.7)
HEMATOCRIT: 41.9 % (ref 39.0–52.0)
HEMOGLOBIN: 14.6 g/dL (ref 13.0–17.0)
Lymphocytes Relative: 46.2 % — ABNORMAL HIGH (ref 12.0–46.0)
Lymphs Abs: 4.4 10*3/uL — ABNORMAL HIGH (ref 0.7–4.0)
MCHC: 34.8 g/dL (ref 30.0–36.0)
MCV: 83.6 fl (ref 78.0–100.0)
MONOS PCT: 9 % (ref 3.0–12.0)
Monocytes Absolute: 0.9 10*3/uL (ref 0.1–1.0)
NEUTROS ABS: 3.9 10*3/uL (ref 1.4–7.7)
Neutrophils Relative %: 40.7 % — ABNORMAL LOW (ref 43.0–77.0)
Platelets: 248 10*3/uL (ref 150.0–400.0)
RBC: 5.02 Mil/uL (ref 4.22–5.81)
RDW: 14.7 % (ref 11.5–15.5)
WBC: 9.6 10*3/uL (ref 4.0–10.5)

## 2018-02-02 LAB — LDL CHOLESTEROL, DIRECT: Direct LDL: 107 mg/dL

## 2018-02-02 LAB — MICROALBUMIN / CREATININE URINE RATIO
Creatinine,U: 218.3 mg/dL
Microalb Creat Ratio: 0.3 mg/g (ref 0.0–30.0)
Microalb, Ur: 0.8 mg/dL (ref 0.0–1.9)

## 2018-02-02 LAB — URINALYSIS, ROUTINE W REFLEX MICROSCOPIC
BILIRUBIN URINE: NEGATIVE
HGB URINE DIPSTICK: NEGATIVE
KETONES UR: NEGATIVE
NITRITE: NEGATIVE
PH: 6 (ref 5.0–8.0)
RBC / HPF: NONE SEEN (ref 0–?)
Specific Gravity, Urine: 1.025 (ref 1.000–1.030)
TOTAL PROTEIN, URINE-UPE24: NEGATIVE
UROBILINOGEN UA: 1 (ref 0.0–1.0)
Urine Glucose: NEGATIVE

## 2018-02-02 LAB — LIPID PANEL
CHOLESTEROL: 173 mg/dL (ref 0–200)
HDL: 33.6 mg/dL — ABNORMAL LOW (ref >39.00)
NonHDL: 139.22
Total CHOL/HDL Ratio: 5
Triglycerides: 224 mg/dL — ABNORMAL HIGH (ref 0.0–149.0)
VLDL: 44.8 mg/dL — ABNORMAL HIGH (ref 0.0–40.0)

## 2018-02-02 LAB — URIC ACID: URIC ACID, SERUM: 8.8 mg/dL — AB (ref 4.0–7.8)

## 2018-02-02 LAB — HEMOGLOBIN A1C: HEMOGLOBIN A1C: 7.3 % — AB (ref 4.6–6.5)

## 2018-02-02 LAB — PSA: PSA: 0.42 ng/mL (ref 0.10–4.00)

## 2018-02-02 MED ORDER — AMOXICILLIN-POT CLAVULANATE 875-125 MG PO TABS: 1.0000 | ORAL_TABLET | Freq: Two times a day (BID) | ORAL | 0 refills | Status: AC

## 2018-02-02 NOTE — Patient Instructions (Signed)

## 2018-02-02 NOTE — Progress Notes (Signed)
Subjective:  Patient ID: [name], male    DOB: 20-Oct-1966  Age: 52 y.o. MRN: [id number]  CC: Annual Exam; Hyperlipidemia; Diabetes; and Hypertension  NEW TO ME  HPI [name] presents for a CPX.  He complains of a 3-week history of cough that is productive of thick yellow phlegm.  He has chronic shortness of breath.  He denies chest pain, hemoptysis, night sweats, fever, or chills.  He has chronic but non-worsening lower extremity edema.  He complains of weight gain and dry skin.  He has a history of prediabetes but does not monitor his blood sugar.  He denies any recent episodes of polyuria, polydipsia, or polyphagia.  He also has a history of gout which is not being treated.  History [name] has a past medical history of Alcohol abuse, Anxiety state, unspecified, Atrial fibrillation ([place]), CHF (congestive heart failure) ([place]), Chronic systolic heart failure ([place]), Edema, History of medication noncompliance, Migraine, Obesity, unspecified, Obstructive sleep apnea, Psychiatric disorder, Pulmonary embolism ([place]), and Shortness of breath.   He has a past surgical history that includes Testicle surgery; Cardiac catheterization; Cardiac catheterization (N/A, 08/13/2015); and RIGHT/LEFT HEART CATH AND CORONARY ANGIOGRAPHY (N/A, 04/14/2017).   His family history includes Cancer in his mother; Diabetes in his daughter, father, and unknown relative; Heart disease in his maternal grandmother; Hypertension in his mother and unknown relative; Stroke in his unknown relative.He reports that he has been smoking cigarettes.  He has a 15.00 pack-year smoking history. he has never used smokeless tobacco. He reports that he drinks alcohol. He reports that he does not use drugs.  Outpatient Medications Prior to Visit  Medication Sig Dispense Refill  . aspirin EC 81 MG tablet Take 1 tablet (81 mg total) by mouth daily. 90 tablet 3  . clonazePAM (KLONOPIN) 1 MG tablet TAKE 1 TABLET BY MOUTH 3 TIMES  DAILY AS NEEDED FOR ANXIETY 90 tablet 2  . losartan (COZAAR) 50 MG tablet Take 1 tablet (50 mg total) by mouth 2 (two) times daily. 60 tablet 11  . magnesium oxide (MAG-OX) 400 MG tablet Take 400 mg by mouth daily as needed (cramping).    . metoprolol succinate (TOPROL-XL) 100 MG 24 hr tablet Take 100 mg by mouth daily. Take with or immediately following a meal.    . NON FORMULARY Bipap, pressure 16/12 with 2L of O2    . pantoprazole (PROTONIX) 40 MG tablet TAKE 1 TABLET (40 MG TOTAL) BY MOUTH DAILY. 90 tablet 3  . potassium chloride SA (K-DUR,KLOR-CON) 20 MEQ tablet Take 20 mEq by mouth daily as needed (cramping).    . risperiDONE (RISPERDAL) 1 MG tablet Take one tablet (1 mg) by mouth at bedtime for 2 weeks then increase to two tablets (2 mg) at bedtime 60 tablet 2  . sildenafil (REVATIO) 20 MG tablet Take 1 tablet (20 mg total) by mouth daily as needed. 15 tablet 11  . spironolactone (ALDACTONE) 25 MG tablet TAKE 1 TABLET (25 MG TOTAL) BY MOUTH DAILY. 30 tablet 6  . torsemide (DEMADEX) 20 MG tablet Take 2 tablets (40 mg total) by mouth daily. 60 tablet 11   No facility-administered medications prior to visit.     ROS Review of Systems  Constitutional: Positive for unexpected weight change. Negative for appetite change, diaphoresis and fatigue.  HENT: Negative.  Negative for facial swelling, sinus pressure, sore throat and trouble swallowing.   Eyes: Negative.  Negative for visual disturbance.  Respiratory: Positive for apnea, cough and shortness of  breath. Negative for chest tightness.   Cardiovascular: Negative for chest pain, palpitations and leg swelling.  Gastrointestinal: Negative for abdominal pain, constipation, diarrhea, nausea and vomiting.  Endocrine: Negative.  Negative for polydipsia, polyphagia and polyuria.  Genitourinary: Negative.  Negative for decreased urine volume, difficulty urinating, dysuria, flank pain, frequency, hematuria and urgency.  Musculoskeletal: Negative.   Negative for back pain, myalgias and neck pain.  Skin: Negative for color change.  Allergic/Immunologic: Negative.   Neurological: Negative.  Negative for dizziness, weakness and light-headedness.  Hematological: Negative for adenopathy. Does not bruise/bleed easily.  Psychiatric/Behavioral: Negative.     Objective:  BP 124/60 (BP Location: Left Arm, Patient Position: Sitting, Cuff Size: Large)   Pulse 71   Temp (!) 97.5 F (36.4 C) (Oral)   Resp 20   Ht 5\' 9"  (1.753 m)   Wt (!) 444 lb 4 oz (201.5 kg)   SpO2 94%   BMI 65.60 kg/m   Physical Exam  Constitutional: He is oriented to person, place, and time. No distress.  HENT:  Mouth/Throat: Oropharynx is clear and moist. No oropharyngeal exudate.  Eyes: Conjunctivae are normal. Left eye exhibits no discharge. No scleral icterus.  Neck: Normal range of motion. Neck supple. No JVD present. No thyromegaly present.  Cardiovascular: Normal rate, regular rhythm and intact distal pulses. Exam reveals no gallop and no friction rub.  No murmur heard. Pulmonary/Chest: Effort normal and breath sounds normal. No respiratory distress. He has no wheezes. He has no rales.  Abdominal: Soft. Bowel sounds are normal. He exhibits no distension and no mass. There is no tenderness. There is no guarding. Hernia confirmed negative in the right inguinal area and confirmed negative in the left inguinal area.  Genitourinary: Testes normal and penis normal. Rectal exam shows no external hemorrhoid, no internal hemorrhoid, no fissure, no mass, no tenderness, anal tone normal and guaiac negative stool. Prostate is enlarged (1+ smooth symm BPH). Prostate is not tender. Right testis shows no mass, no swelling and no tenderness. Left testis shows no mass, no swelling and no tenderness. Circumcised. No penile erythema or penile tenderness. No discharge found.  Genitourinary Comments: The left testicle is smaller than the right  Musculoskeletal: Normal range of motion.  He exhibits edema (trace pitting edema in BLE). He exhibits no tenderness or deformity.  Lymphadenopathy:    He has no cervical adenopathy.       Right: No inguinal adenopathy present.       Left: No inguinal adenopathy present.  Neurological: He is alert and oriented to person, place, and time.  Skin: Skin is warm and dry. No rash noted. He is not diaphoretic. No erythema. No pallor.  Psychiatric: He has a normal mood and affect. His behavior is normal. Judgment and thought content normal.  Vitals reviewed.   Lab Results  Component Value Date   WBC 9.6 02/02/2018   HGB 14.6 02/02/2018   HCT 41.9 02/02/2018   PLT 248.0 02/02/2018   GLUCOSE 188 (H) 01/28/2018   CHOL 173 02/02/2018   TRIG 224.0 (H) 02/02/2018   HDL 33.60 (L) 02/02/2018   LDLDIRECT 107.0 02/02/2018   LDLCALC 89 01/15/2017   ALT 15 08/24/2017   AST 12 08/24/2017   NA 136 01/28/2018   K 3.8 01/28/2018   CL 103 01/28/2018   CREATININE 1.08 01/28/2018   BUN 12 01/28/2018   CO2 23 01/28/2018   TSH 1.89 02/02/2018   PSA 0.42 02/02/2018   INR 1.10 04/13/2017   HGBA1C 7.3 (H)  02/02/2018   MICROALBUR 0.8 02/02/2018    Assessment & Plan:   Carzell was seen today for annual exam, hyperlipidemia, diabetes and hypertension.  Diagnoses and all orders for this visit:  Paroxysmal atrial fibrillation ([place])- He has good rate and rhythm control.  He tells me his cardiologist told him that he did not need to be anticoagulated. -     Thyroid Panel With TSH; Future  Essential hypertension- His blood pressure is adequately well controlled.  Electrolytes and renal function are normal. -     CBC with Differential/Platelet; Future -     Urinalysis, Routine w reflex microscopic; Future  Type 2 diabetes mellitus with complication, without long-term current use of insulin ([place])- His A1c is up to 7.3%.  Will start treating this with metformin. -     Hemoglobin A1c; Future -     Urinalysis, Routine w reflex microscopic; Future -      Ambulatory referral to Ophthalmology -     Microalbumin / creatinine urine ratio; Future  Hyperlipidemia with target LDL less than 100- He has achieved his LDL goal and is doing well on the statin. -     Lipid panel; Future  Idiopathic gout, unspecified chronicity, unspecified site- His uric acid is elevated at 8.8.  He is at risk of having a gout attack.  Will start allopurinol to reduce the risk of gouty arthropathy. -     Uric acid; Future  Cough- His chest x-ray is negative for mass or infiltrate. -     Cancel: DG Chest 2 View; Future -     Cancel: DG Chest 2 View; Future -     DG Chest 2 View; Future  RTI (respiratory tract infection)- I will treat the infection with Augmentin. -     amoxicillin-clavulanate (AUGMENTIN) 875-125 MG tablet; Take 1 tablet by mouth 2 (two) times daily for 10 days.  Benign prostatic hyperplasia without lower urinary tract symptoms- His PSA is low which is reassuring that he does not prostate cancer.  He has no symptoms that need to be treated. -     PSA; Future  Pyuria- His labs show mild pyuria for several years.  He is asymptomatic.  Will screen for gonorrhea and chlamydia. -     GC/Chlamydia Probe Amp; Future   I am having [name] start on amoxicillin-clavulanate, metFORMIN, and allopurinol. I am also having him maintain his NON FORMULARY, aspirin EC, potassium chloride SA, magnesium oxide, spironolactone, clonazePAM, risperiDONE, metoprolol succinate, sildenafil, pantoprazole, losartan, and torsemide.  Meds ordered this encounter  Medications  . amoxicillin-clavulanate (AUGMENTIN) 875-125 MG tablet    Sig: Take 1 tablet by mouth 2 (two) times daily for 10 days.    Dispense:  20 tablet    Refill:  0  . metFORMIN (GLUCOPHAGE-XR) 750 MG 24 hr tablet    Sig: Take 1 tablet (750 mg total) by mouth daily with breakfast.    Dispense:  180 tablet    Refill:  1  . allopurinol (ZYLOPRIM) 100 MG tablet    Sig: Take 1 tablet (100 mg total) by  mouth daily.    Dispense:  90 tablet    Refill:  1     Follow-up: Return in about 4 months (around 06/04/2018).  Scarlette Calico, MD

## 2018-02-03 ENCOUNTER — Encounter: Payer: Self-pay | Admitting: Internal Medicine

## 2018-02-03 ENCOUNTER — Telehealth (HOSPITAL_COMMUNITY): Payer: Self-pay

## 2018-02-03 DIAGNOSIS — Z0000 Encounter for general adult medical examination without abnormal findings: Secondary | ICD-10-CM | POA: Insufficient documentation

## 2018-02-03 LAB — THYROID PANEL WITH TSH
Free Thyroxine Index: 2.3 (ref 1.4–3.8)
T3 UPTAKE: 31 % (ref 22–35)
T4, Total: 7.3 ug/dL (ref 4.9–10.5)
TSH: 1.89 mIU/L (ref 0.40–4.50)

## 2018-02-03 MED ORDER — ALLOPURINOL 100 MG PO TABS: 100.0000 mg | ORAL_TABLET | Freq: Every day | ORAL | 1 refills | Status: DC

## 2018-02-03 MED ORDER — METFORMIN HCL ER 750 MG PO TB24: 750.0000 mg | ORAL_TABLET | Freq: Every day | ORAL | 1 refills | Status: DC

## 2018-02-03 NOTE — Assessment & Plan Note (Signed)

## 2018-02-03 NOTE — Telephone Encounter (Signed)
I called [name] to schedule an appointment. He did not answer so I left a voicemail stating my name and purpose, purpose for calling and requested that he call me back.

## 2018-02-04 ENCOUNTER — Other Ambulatory Visit: Payer: Medicare HMO

## 2018-02-04 ENCOUNTER — Encounter (HOSPITAL_COMMUNITY): Payer: Self-pay | Admitting: Licensed Clinical Social Worker

## 2018-02-04 ENCOUNTER — Ambulatory Visit (INDEPENDENT_AMBULATORY_CARE_PROVIDER_SITE_OTHER): Payer: Medicare HMO | Admitting: Licensed Clinical Social Worker

## 2018-02-04 DIAGNOSIS — F3162 Bipolar disorder, current episode mixed, moderate: Secondary | ICD-10-CM

## 2018-02-04 DIAGNOSIS — F411 Generalized anxiety disorder: Secondary | ICD-10-CM

## 2018-02-04 DIAGNOSIS — F609 Personality disorder, unspecified: Secondary | ICD-10-CM

## 2018-02-04 DIAGNOSIS — R8281 Pyuria: Secondary | ICD-10-CM

## 2018-02-04 DIAGNOSIS — N390 Urinary tract infection, site not specified: Secondary | ICD-10-CM | POA: Diagnosis not present

## 2018-02-04 DIAGNOSIS — R69 Illness, unspecified: Secondary | ICD-10-CM | POA: Diagnosis not present

## 2018-02-04 DIAGNOSIS — F6089 Other specific personality disorders: Secondary | ICD-10-CM
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
## 2018-02-04 NOTE — Addendum Note (Signed)
Addended by: Trenda Moots on: 0/12/7492 10:32 AM   Modules accepted: Orders

## 2018-02-05 ENCOUNTER — Other Ambulatory Visit: Payer: Self-pay | Admitting: Family Medicine

## 2018-02-07 LAB — GC/CHLAMYDIA PROBE AMP
CHLAMYDIA, DNA PROBE: NEGATIVE
Neisseria gonorrhoeae by PCR: NEGATIVE

## 2018-02-08 ENCOUNTER — Encounter: Payer: Self-pay | Admitting: Internal Medicine

## 2018-02-11 ENCOUNTER — Telehealth (HOSPITAL_COMMUNITY): Payer: Self-pay

## 2018-02-12 ENCOUNTER — Other Ambulatory Visit (HOSPITAL_COMMUNITY): Payer: Self-pay

## 2018-02-12 ENCOUNTER — Telehealth (HOSPITAL_COMMUNITY): Payer: Self-pay

## 2018-02-12 NOTE — Telephone Encounter (Signed)
I called [name] today to try and come out to see him. He was not home and did not know how soon he could be back. We decided to wait until next week to meet.

## 2018-02-12 NOTE — Progress Notes (Signed)
I went to Mr Boley home for our scheduled meeting. He stated he had forgotten I was coming and was not prepared to have the visit. He requested I contact him on Friday and we would meet then.

## 2018-02-12 NOTE — Telephone Encounter (Signed)
I called [name] to schedule an appointment. He stated he could meet Thursday at 16:00.

## 2018-02-18 ENCOUNTER — Ambulatory Visit: Payer: Self-pay

## 2018-02-18 NOTE — Telephone Encounter (Signed)
Pt calling with c/o of severe heartburn. Pt stated that he took 450 mg of apple cider vinegar capsule yesterday and had no problems. Pt stated that he took another apple cider capsule at 0200 and he immediately started having severe heartburn. Pt states he drank some milk and he drank some orange juice and that made it worse. Pt advised to take any tums while on the phone. Advised pt to take milk of magnesia to see if can get any relief. Within 5 minutes he stated that his heartburn was getting better and he began burping. Called and spoke to Sam at his PCP office. She agreed that if pt is not better to send to [facility] or ED. Plan to call pt back to check on condition. Additional Information . Commented on: [1] Patient claims chest pain is same as previously diagnosed "heartburn" AND [2] describes burning in chest AND [3] accompanying sour taste in mouth    Took 450 mg Apple cider vinegar and after wards having moderate heart burn and has pain to the base of neck to the top breast bone.  Answer Assessment - Initial Assessment Questions 1. LOCATION: "Where does it hurt?"      Throat and top of breast bone 2. RADIATION: "Does the pain go anywhere else?" (e.g., into neck, jaw, arms, back)     no 3. ONSET: "When did the chest pain begin?" (Minutes, hours or days)      This am its a burning pain 4. PATTERN "Does the pain come and go, or has it been constant since it started?"  "Does it get worse with exertion?"      Comes and goes- drank milk and it came back after drinking orange juice 5. DURATION: "How long does it last" (e.g., seconds, minutes, hours)     The burning pain is continuous 6. SEVERITY: "How bad is the pain?"  (e.g., Scale 1-10; mild, moderate, or severe)    - MILD (1-3): doesn't interfere with normal activities     - MODERATE (4-7): interferes with normal activities or awakens from sleep    - SEVERE (8-10): excruciating pain, unable to do any normal activities       moderate 7. CARDIAC  RISK FACTORS: "Do you have any history of heart problems or risk factors for heart disease?" (e.g., prior heart attack, angina; high blood pressure, diabetes, being overweight, high cholesterol, smoking, or strong family history of heart disease)     CHF, diabetes, overweight, smoking 8. PULMONARY RISK FACTORS: "Do you have any history of lung disease?"  (e.g., blood clots in lung, asthma, emphysema, birth control pills)   H/o blood clots to lungs 9. CAUSE: "What do you think is causing the chest pain?"     Apple cider vinegar capsules 10. OTHER SYMPTOMS: "Do you have any other symptoms?" (e.g., dizziness, nausea, vomiting, sweating, fever, difficulty breathing, cough)       cough 11. PREGNANCY: "Is there any chance you are pregnant?" "When was your last menstrual period?"       n/a  Protocols used: CHEST PAIN-A-AH

## 2018-02-18 NOTE — Telephone Encounter (Signed)
Called pt back and he stated he is much better. Advised pt to eat a light dinner no fried foods or salads. Wife is out to get him Maalox or Mylanta and told pt no more apple cider vinegar capsules.

## 2018-02-24 ENCOUNTER — Telehealth (HOSPITAL_COMMUNITY): Payer: Self-pay

## 2018-02-24 NOTE — Telephone Encounter (Signed)
CHF Clinic appointment reminder call placed to patient.  Does understand purpose of this appointment and where CHF Clinic is located? Yes  How is patient feeling? C/o more weakness in legs walking around [place]. States weight stable and no other s/s.  Taking meds as prescribed and sticking with diet and fluid restrictions.  Will place note on appt note to f/u and make provider aware.  Does patient have all of their medications? Yes  Patient also reminded to take all medications as prescribed on the day of his/her appointment and to bring all medications to this appointment. Advised to call our office for tardiness or cancellations/rescheduling needs.     (For clinic use)   ___________ Completed appointment   ___________ Evelina Bucy medications as prescribed before coming to appointment   ___________ Brought medications and/or medication list (whichever they go by) to their appointment    Vilinda Blanks, [place], BSN, CHFN

## 2018-02-25 ENCOUNTER — Ambulatory Visit (HOSPITAL_COMMUNITY)
Admission: RE | Admit: 2018-02-25 | Discharge: 2018-02-25 | Disposition: A | Discharge status: Home / Self Care | Payer: Medicare HMO | Source: Ambulatory Visit | Attending: Cardiology | Admitting: Cardiology

## 2018-02-25 ENCOUNTER — Other Ambulatory Visit (HOSPITAL_COMMUNITY): Payer: Self-pay

## 2018-02-25 VITALS — BP 92/68 | HR 91 | Wt >= 6400 oz

## 2018-02-25 DIAGNOSIS — Z79899 Other long term (current) drug therapy: Secondary | ICD-10-CM | POA: Diagnosis not present

## 2018-02-25 DIAGNOSIS — Z720 Tobacco use: Secondary | ICD-10-CM | POA: Diagnosis not present

## 2018-02-25 DIAGNOSIS — Z86711 Personal history of pulmonary embolism: Secondary | ICD-10-CM | POA: Diagnosis not present

## 2018-02-25 DIAGNOSIS — I428 Other cardiomyopathies: Secondary | ICD-10-CM

## 2018-02-25 DIAGNOSIS — I5022 Chronic systolic (congestive) heart failure: Secondary | ICD-10-CM | POA: Insufficient documentation

## 2018-02-25 DIAGNOSIS — Z7982 Long term (current) use of aspirin: Secondary | ICD-10-CM | POA: Diagnosis not present

## 2018-02-25 DIAGNOSIS — I110 Hypertensive heart disease with heart failure: Secondary | ICD-10-CM | POA: Diagnosis present

## 2018-02-25 DIAGNOSIS — Z833 Family history of diabetes mellitus: Secondary | ICD-10-CM | POA: Insufficient documentation

## 2018-02-25 DIAGNOSIS — Z8249 Family history of ischemic heart disease and other diseases of the circulatory system: Secondary | ICD-10-CM | POA: Diagnosis not present

## 2018-02-25 DIAGNOSIS — I429 Cardiomyopathy, unspecified: Secondary | ICD-10-CM | POA: Diagnosis not present

## 2018-02-25 DIAGNOSIS — F17210 Nicotine dependence, cigarettes, uncomplicated: Secondary | ICD-10-CM | POA: Insufficient documentation

## 2018-02-25 DIAGNOSIS — Z888 Allergy status to other drugs, medicaments and biological substances status: Secondary | ICD-10-CM | POA: Insufficient documentation

## 2018-02-25 DIAGNOSIS — I493 Ventricular premature depolarization: Secondary | ICD-10-CM | POA: Diagnosis not present

## 2018-02-25 DIAGNOSIS — Z6841 Body Mass Index (BMI) 40.0 and over, adult: Secondary | ICD-10-CM | POA: Insufficient documentation

## 2018-02-25 DIAGNOSIS — R51 Headache: Secondary | ICD-10-CM | POA: Diagnosis not present

## 2018-02-25 DIAGNOSIS — G4733 Obstructive sleep apnea (adult) (pediatric): Secondary | ICD-10-CM | POA: Diagnosis not present

## 2018-02-25 DIAGNOSIS — K219 Gastro-esophageal reflux disease without esophagitis: Secondary | ICD-10-CM | POA: Diagnosis not present

## 2018-02-25 DIAGNOSIS — R69 Illness, unspecified: Secondary | ICD-10-CM | POA: Diagnosis not present

## 2018-02-25 DIAGNOSIS — F419 Anxiety disorder, unspecified: Secondary | ICD-10-CM | POA: Insufficient documentation

## 2018-02-25 DIAGNOSIS — Z09 Encounter for follow-up examination after completed treatment for conditions other than malignant neoplasm: Secondary | ICD-10-CM | POA: Insufficient documentation

## 2018-02-25 DIAGNOSIS — R42 Dizziness and giddiness: Secondary | ICD-10-CM | POA: Diagnosis not present

## 2018-02-25 DIAGNOSIS — I472 Ventricular tachycardia: Secondary | ICD-10-CM | POA: Insufficient documentation

## 2018-02-25 DIAGNOSIS — I480 Paroxysmal atrial fibrillation: Secondary | ICD-10-CM | POA: Diagnosis not present

## 2018-02-25 DIAGNOSIS — Z823 Family history of stroke: Secondary | ICD-10-CM | POA: Diagnosis not present

## 2018-02-25 DIAGNOSIS — Z7984 Long term (current) use of oral hypoglycemic drugs: Secondary | ICD-10-CM | POA: Diagnosis not present

## 2018-02-25 DIAGNOSIS — Z808 Family history of malignant neoplasm of other organs or systems: Secondary | ICD-10-CM | POA: Insufficient documentation

## 2018-02-25 DIAGNOSIS — R0602 Shortness of breath: Secondary | ICD-10-CM | POA: Insufficient documentation

## 2018-02-25 LAB — BASIC METABOLIC PANEL
Anion gap: 8 (ref 5–15)
BUN: 15 mg/dL (ref 6–20)
CALCIUM: 8.6 mg/dL — AB (ref 8.9–10.3)
CO2: 23 mmol/L (ref 22–32)
CREATININE: 1.06 mg/dL (ref 0.61–1.24)
Chloride: 106 mmol/L (ref 101–111)
GFR calc Af Amer: >60 mL/min (ref >60)
Glucose, Bld: 261 mg/dL — ABNORMAL HIGH (ref 65–99)
Potassium: 4.1 mmol/L (ref 3.5–5.1)
SODIUM: 137 mmol/L (ref 135–145)

## 2018-02-25 LAB — BRAIN NATRIURETIC PEPTIDE: B NATRIURETIC PEPTIDE 5: 106.5 pg/mL — AB (ref 0.0–100.0)

## 2018-02-25 MED ORDER — TORSEMIDE 20 MG PO TABS: 20.0000 mg | ORAL_TABLET | Freq: Every day | ORAL | 11 refills | Status: DC

## 2018-02-25 MED ORDER — LOSARTAN POTASSIUM 50 MG PO TABS: 50.0000 mg | ORAL_TABLET | Freq: Two times a day (BID) | ORAL | 3 refills | Status: DC

## 2018-02-25 MED ORDER — LOSARTAN POTASSIUM 100 MG PO TABS: 100.0000 mg | ORAL_TABLET | Freq: Every day | ORAL | 11 refills | Status: DC

## 2018-02-25 NOTE — Patient Instructions (Addendum)
Routine lab work today. Will notify you of abnormal results, otherwise no news is good news!  Stay off Metformin.  DECREASE Torsemide to 20 mg once daily.  Follow up 4 weeks with Amy Clegg NP-C.  Take all medication as prescribed the day of your appointment. Bring all medications with you to your appointment.  Do the following things EVERYDAY: 1) Weigh yourself in the morning before breakfast. Write it down and keep it in a log. 2) Take your medicines as prescribed 3) Eat low salt foods-Limit salt (sodium) to 2000 mg per day.  4) Stay as active as you can everyday 5) Limit all fluids for the day to less than 2 liters

## 2018-02-25 NOTE — Progress Notes (Signed)
Advanced Heart Failure Clinic Note   Primary Care:Dr. Buelah Manis, Modena Nunnery, MD Primary Cardiologist: Dr. Aundra Dubin   HPI: [name] is a 52 year old male with a past medical history of NICM, EF 25-30% in March 2018, felt to be related to prior ETOH abuse. He also has a history of PE 03/2014 completed a years course of Xarelto, morbid obesity, OSA, and tobacco abuse.   He was admitted 11/12/15-11/14/15 with acute on chronic systolic CHF and palpitations. He wore a 30 day event monitor at discharge as he had frequent PVC's and questionable Afib on telemetry. Also with some NSVT, so he was started on Amiodaone, but at follow up had not started taking it. He had previously refused ICD and was seen inpatient by Dr. Rayann Heman who felt that his morbid obesity was a prohibitive factor.   He was seen in the clinic in April 2018. He had started drinking ETOH again. Volume status was stable, he is not an Entresto candidate due to angioedema with lisinopril. Weight was 411 pounds.   Admitted 04/12/17 with SOB, chest pain. D- dimer was 1.16, chest CT without central obstructing PE, however more peripheral and subsegmental pulmonary artery branches were not confidently evaluated due to his body habitus. He was started on a heparin gtt for presumed PE, however VQ scan showed no PE. Troponin was elevated, peaked at 3.37. LHC showed no CAD. Echo showed an EF of 15%, grade 2 DD, no pericarditis. He was diuresed with IV lasix, and started on torsemide 20mg  at discharge. Discharge weight was 405 pounds.   Today he returns for HF follow up. Paramedicine trying to establish with him but he has cancelled. Last visit losartan and torsemide increased. He did not increase losartan. Overall feeling fair. Ongoing SOB with exertion. Denies PND/orthopnea.Having some dizziness. Appetite ok. No fever or chills. Weight at home pounds. Smoking 5-6 cigarettes per day. Taking all medications.   Labs (5/13): K 4.1, creatinine 1.05 Labs  (1/14): K 3.8, creatinine 1.16, BNP 54 Labs (2/14): K 3.7, creatinine 1.11, BNP 28 Labs (2/16): K 4.1, creatinine 1.03, LDL 96, HCT 40 Labs (3/16): K 3.7, K 1.13, BNP 367 Labs (8/16): BNP 43, K 3.5, creatinine 1.01 Labs (08/13/15): K 3.7, creatinine 1.18, HCT 41.3 Labs (12/16): K 3.7, creatinine 1.14, TGs 495, digoxin < 0.2 Labs (2/17): K 3.8, creatinine 0.99, LDL 107, TGs 222 Labs (5/17): K 4, creatinine 1.47 Labs (2/18): K 3.9, creatinine 1.06, hgb 15.1, TGs 163, LDL 89, HDL 28, TSH normal  Labs (5/18): K 3.8, creatinine 1.12.  Labs (12/30/2017): K 3.8 Creatinine 1.25  Labs (01/28/2018): K 3.8 Creatinine 1.08  PMH: 1. Nonischemic cardiomyopathy: Prior cath with no significant CAD.  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  Prior echoes with EF as low as 25%.  Echo (9/13) with EF 35-40%, moderate to severe LV dilation, diffuse hypokinesis, mild MR. Echo (5/15) with EF 30-35%, moderate to severe LAE, normal RV size and systolic function.  Angioedema with ACEI, headaches with hydralazine/nitrates. Echo (3/16) with EF 25-30%, severe LV dilation, normal RV size and systolic function.  [facility] 08/13/15 showed no significant coronary disease; RA mean 6, PA 33/11 mean 23, PCWP mean 13, Fick CO/CI 4.75 /1.68 (difficult study, radial artery spasm, if needs future cath would use groin).  Echo (9/16) showed EF 20-25%.   - Echo (3/18): EF 25-30%, moderate LAE, Echo 4/18 EF 15%  2. HTN: angioedema with ACEI.  3. OSA: on Bipap 4.  Morbid obesity 5. Paroxysmal atrial fibrillation: Not documented recently.   6. Smoker.  7. Anxiety/panic attacks 8. PE: 5/15, diagnosed by V/Q scan. CTA chest 8/16 negative for PE.  9. NSVT, PVCs: 30 day monitor (12/16) with PVCs, PACs, no atrial fibrillation.  10. Hematuria: Apparently had negative workup by urology.  11. ABIs (6/16) were normal 12. 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  13. Gout 14. Low back pain.   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  Has son and daughter.    FH: No premature CAD.    ROS: All systems reviewed and negative except as per HPI.   Past Medical History:  Diagnosis Date  . Alcohol abuse   . Anxiety state, unspecified   . Atrial fibrillation ([place])   . CHF (congestive heart failure) ([place])   . Chronic systolic heart failure ([place])   . Edema   . History of medication noncompliance   . Migraine   . Obesity, unspecified   . Obstructive sleep apnea   . Psychiatric disorder   . Pulmonary embolism ([place])   . Shortness of breath     Current Outpatient Medications  Medication Sig Dispense Refill  . allopurinol (ZYLOPRIM) 100 MG tablet Take 1 tablet (100 mg total) by mouth daily. 90 tablet 1  . aspirin EC 81 MG tablet Take 1 tablet (81 mg total) by mouth daily. 90 tablet 3  . clonazePAM (KLONOPIN) 1 MG tablet TAKE 1 TABLET BY MOUTH 3 TIMES DAILY AS NEEDED FOR ANXIETY 90 tablet 2  . losartan (COZAAR) 50 MG tablet Take 1 tablet (50 mg total) by mouth 2 (two) times daily. 60 tablet 11  . magnesium oxide (MAG-OX) 400 MG tablet Take 400 mg by mouth daily as needed (cramping).    . metFORMIN (GLUCOPHAGE-XR) 750 MG 24 hr tablet Take 1 tablet (750 mg total) by mouth daily with breakfast. 180 tablet 1  . metoprolol succinate (TOPROL-XL) 100 MG 24 hr tablet Take 100 mg by mouth daily. Take with or immediately following a meal.    . NON FORMULARY Bipap, pressure 16/12 with 2L of O2    . pantoprazole (PROTONIX) 40 MG tablet TAKE 1 TABLET (40 MG TOTAL) BY MOUTH DAILY. 90 tablet 3  . potassium chloride SA (K-DUR,KLOR-CON) 20 MEQ tablet Take 20 mEq by mouth daily as needed (cramping).    . risperiDONE (RISPERDAL) 1 MG tablet Take one tablet (1 mg) by mouth at bedtime for 2 weeks then increase to two tablets (2 mg) at bedtime 60 tablet 2  . sildenafil (REVATIO) 20 MG tablet Take 1 tablet (20 mg total) by mouth daily as needed. 15 tablet 11  .  spironolactone (ALDACTONE) 25 MG tablet TAKE 1 TABLET (25 MG TOTAL) BY MOUTH DAILY. 30 tablet 6  . torsemide (DEMADEX) 20 MG tablet Take 2 tablets (40 mg total) by mouth daily. 60 tablet 11   No current facility-administered medications for this encounter.     Allergies  Allergen Reactions  . Ace Inhibitors Anaphylaxis and Swelling  . Buspirone Other (See Comments)    dizziness      Social History   Socioeconomic History  . Marital status: Married    Spouse name: Not on file  . Number of children: 3  . Years of education: Not on file  . Highest education level: Not on file  Occupational History  . Occupation: DISABLED  Social Needs  . Financial resource strain: Not  on file  . Food insecurity:    Worry: Not on file    Inability: Not on file  . Transportation needs:    Medical: Not on file    Non-medical: Not on file  Tobacco Use  . Smoking status: Current Every Day Smoker    Packs/day: 0.50    Years: 30.00    Pack years: 15.00    Types: Cigarettes  . Smokeless tobacco: Never Used  . Tobacco comment: 10/2014- using patches  Substance and Sexual Activity  . Alcohol use: Yes    Alcohol/week: 1.8 oz    Types: 3 Cans of beer per week    Comment: nothing to drink since last OV with Dr. Harl Bowie per patient  . Drug use: No  . Sexual activity: Yes  Lifestyle  . Physical activity:    Days per week: Not on file    Minutes per session: Not on file  . Stress: Not on file  Relationships  . Social connections:    Talks on phone: Not on file    Gets together: Not on file    Attends religious service: Not on file    Active member of club or organization: Not on file    Attends meetings of clubs or organizations: Not on file    Relationship status: Not on file  . Intimate partner violence:    Fear of current or ex partner: Not on file    Emotionally abused: Not on file    Physically abused: Not on file    Forced sexual activity: Not on file  Other Topics Concern  . Not  on file  Social History Narrative   He smokes about a pack per day and he has been smoking since he was 52 years of age.  He drinks alcohol occasionally, but he denies any illicit drug abuse.  He is presently on disability.    Lives with wife in a 2 story home.  Has 2 children.   Previously worked in [employer], last worked in 1998.   Highest level of education:  11th grade              Family History  Problem Relation Age of Onset  . Cancer Mother        brain tumor  . Hypertension Mother   . Diabetes Father        Deceased, 16  . Heart disease Maternal Grandmother   . Hypertension Unknown        Family History  . Stroke Unknown        Family History  . Diabetes Unknown        Family History  . Diabetes Daughter     Vitals:   02/25/18 1018 02/25/18 1019  BP: 102/70 92/68  Pulse: 91   SpO2: 97%   Weight: (!) 445 lb (201.9 kg)    Filed Weights   02/25/18 1018  Weight: (!) 445 lb (201.9 kg)      PHYSICAL EXAM: General:  No resp difficulty. Walked slowly in the clinic.  HEENT: normal Neck: supple. JVP 5-6 . Carotids 2+ bilat; no bruits. No lymphadenopathy or thryomegaly appreciated. Cor: PMI nondisplaced. Regular rate & rhythm. No rubs, gallops or murmurs. Lungs: clear Abdomen: obese, soft, nontender, nondistended. No hepatosplenomegaly. No bruits or masses. Good bowel sounds. Extremities: no cyanosis, clubbing, rash, edema Neuro: alert & orientedx3, cranial nerves grossly intact. moves all 4 extremities w/o difficulty. Affect pleasant  ASSESSMENT & PLAN: 1. Chronic systolic CHF: Nonischemic cardiomyopathy.  Echo (3/18) with EF 25-30%. He was seen by EP and decided against ICD given marked obesity.  QRS not wide enough for CRT. Echo 4/18 EF 15%.  NYHA IIIb. Volume status low. Cut back torsemide to 20 mg daily.  - Continue Spiro 25 mg daily - Continue Toprol XL 100mg  will not increase with dizziness.  -Continue losartan 50 mg twice a day. He does not want to take  once a day. No entresto with h/o angioedema.  Had side effects from digoxin, does not want to take. - Headaches with Bidil, cannot take.  2. Obesity:  Body mass index is 65.72 kg/m.  He has information to pursue weight loss surgery. 3. Smoking:  - discussed cessation.   4. OSA:  - Continue nightly 5. Paroxysmal atrial fibrillation:  Regular pulse.  Has not been anticoagulated in 3 years. He does not want start anticoagulatns f 6. PE: 5/15, diagnosed by V/Q scan.  He was on Xarelto for about a year but stopped in the setting of hematuria.  - Recent hospitalization without PE on CT and VQ scan. Not on anticoagulation. Does not want to anticoagulants.  7. Anxiety - Has follow up with psychiatry.   8. GERD   Follow up in 4 weeks. Continue Paramedicine. Set up ECHo at next visit.    [name], NP 02/25/18

## 2018-02-25 NOTE — Progress Notes (Signed)
Paramedicine Encounter   Patient ID: [name] , male,   DOB: 06-04-1966,51 y.o.,  MRN: [id number]  [name] was seen in the clinic today and reported feeling unwell. He reports having dizziness and weakness over the past several days. He stated he had not taken torsemide in the past 2 days. Amy told him to hold off his medications for today and is changing losartan to 2 tablets at bedtime and decreasing torsemide to 20 mg once daily. Due to some scheduling issues, we have been previously unable to get together but he reports that Thursday's are the best time for him to meet. We have scheduled a meeting for next Thursday at 15:00. He has a return appointment at the clinic in one month.  Time spent with patient: 24 minutes  Jacquiline Doe, EMT 02/25/2018   ACTION: Next visit planned for 1 week

## 2018-03-01 DIAGNOSIS — G4733 Obstructive sleep apnea (adult) (pediatric): Secondary | ICD-10-CM | POA: Diagnosis not present

## 2018-03-01 DIAGNOSIS — I10 Essential (primary) hypertension: Secondary | ICD-10-CM | POA: Diagnosis not present

## 2018-03-01 DIAGNOSIS — I509 Heart failure, unspecified: Secondary | ICD-10-CM | POA: Diagnosis not present

## 2018-03-04 ENCOUNTER — Telehealth (HOSPITAL_COMMUNITY): Payer: Self-pay | Admitting: Pharmacist

## 2018-03-04 ENCOUNTER — Telehealth (HOSPITAL_COMMUNITY): Payer: Self-pay

## 2018-03-04 ENCOUNTER — Other Ambulatory Visit (HOSPITAL_COMMUNITY): Payer: Self-pay | Admitting: Pharmacist

## 2018-03-04 ENCOUNTER — Other Ambulatory Visit (HOSPITAL_COMMUNITY): Payer: Self-pay

## 2018-03-04 MED ORDER — TORSEMIDE 20 MG PO TABS: 40.0000 mg | ORAL_TABLET | Freq: Every day | ORAL | 11 refills | Status: DC

## 2018-03-04 NOTE — Progress Notes (Signed)
Paramedicine Encounter    Patient ID: [name], male    DOB: [birth year], 52 y.o.   MRN: [id number]   Patient Care Team: [name], MD as PCP - General (Internal Medicine) [name], MD as Consulting Physician (Cardiology)  Patient Active Problem List   Diagnosis Date Noted  . Routine general medical examination at a health care facility 02/03/2018  . Idiopathic gout 02/02/2018  . Cough 02/02/2018  . RTI (respiratory tract infection) 02/02/2018  . Pyuria 02/02/2018  . PE (pulmonary thromboembolism) ([place]) 04/12/2017  . Hereditary and idiopathic peripheral neuropathy 07/16/2016  . Paroxysmal atrial fibrillation ([facility])   . Chronic anticoagulation, secondary to PE 03/2014 12/27/2014  . Osteoarthritis of both knees 12/27/2014  . Chronic systolic CHF (congestive heart failure) ([place])   . NSVT (nonsustained ventricular tachycardia) ([place]) 09/26/2013  . Alcohol abuse 08/10/2013  . Hyperlipidemia with target LDL less than 100 06/29/2013  . Nonischemic cardiomyopathy ([place]) 01/26/2013  . Type II diabetes mellitus with manifestations ([place]) 04/13/2012  . Tobacco user 02/18/2012  . Migraine 08/10/2011  . ED (erectile dysfunction) 07/11/2011  . GAD (generalized anxiety disorder) 06/12/2010  . Morbid obesity ([place]) 06/11/2010  . Essential hypertension 06/11/2010  . Systolic CHF, chronic ([place]) 06/11/2010  . Esophageal reflux 06/11/2010  . OSA (obstructive sleep apnea) with BiPap and oxygen 06/11/2010    Current Outpatient Medications:  .  allopurinol (ZYLOPRIM) 100 MG tablet, Take 1 tablet (100 mg total) by mouth daily., Disp: 90 tablet, Rfl: 1 .  aspirin EC 81 MG tablet, Take 1 tablet (81 mg total) by mouth daily., Disp: 90 tablet, Rfl: 3 .  clonazePAM (KLONOPIN) 1 MG tablet, TAKE 1 TABLET BY MOUTH 3 TIMES DAILY AS NEEDED FOR ANXIETY, Disp: 90 tablet, Rfl: 2 .  losartan (COZAAR) 50 MG tablet, Take 1 tablet (50 mg total) by mouth 2 (two) times daily., Disp: 60 tablet, Rfl:  3 .  magnesium oxide (MAG-OX) 400 MG tablet, Take 400 mg by mouth daily as needed (cramping)., Disp: , Rfl:  .  metoprolol succinate (TOPROL-XL) 100 MG 24 hr tablet, Take 100 mg by mouth daily. Take with or immediately following a meal., Disp: , Rfl:  .  NON FORMULARY, Bipap, pressure 16/12 with 2L of O2, Disp: , Rfl:  .  pantoprazole (PROTONIX) 40 MG tablet, TAKE 1 TABLET (40 MG TOTAL) BY MOUTH DAILY., Disp: 90 tablet, Rfl: 3 .  potassium chloride SA (K-DUR,KLOR-CON) 20 MEQ tablet, Take 20 mEq by mouth daily as needed (cramping)., Disp: , Rfl:  .  risperiDONE (RISPERDAL) 1 MG tablet, Take one tablet (1 mg) by mouth at bedtime for 2 weeks then increase to two tablets (2 mg) at bedtime, Disp: 60 tablet, Rfl: 2 .  sildenafil (REVATIO) 20 MG tablet, Take 1 tablet (20 mg total) by mouth daily as needed., Disp: 15 tablet, Rfl: 11 .  spironolactone (ALDACTONE) 25 MG tablet, TAKE 1 TABLET (25 MG TOTAL) BY MOUTH DAILY., Disp: 30 tablet, Rfl: 6 .  torsemide (DEMADEX) 20 MG tablet, Take 1 tablet (20 mg total) by mouth daily., Disp: 30 tablet, Rfl: 11 .  metFORMIN (GLUCOPHAGE-XR) 750 MG 24 hr tablet, Take 750 mg by mouth daily with breakfast., Disp: , Rfl:  Allergies  Allergen Reactions  . Ace Inhibitors Anaphylaxis and Swelling  . Buspirone Other (See Comments)    dizziness      Social History   Socioeconomic History  . Marital status: Married    Spouse name: Not on file  .  Number of children: 3  . Years of education: Not on file  . Highest education level: Not on file  Occupational History  . Occupation: DISABLED  Social Needs  . Financial resource strain: Not on file  . Food insecurity:    Worry: Not on file    Inability: Not on file  . Transportation needs:    Medical: Not on file    Non-medical: Not on file  Tobacco Use  . Smoking status: Current Every Day Smoker    Packs/day: 0.50    Years: 30.00    Pack years: 15.00    Types: Cigarettes  . Smokeless tobacco: Never Used  .  Tobacco comment: 10/2014- using patches  Substance and Sexual Activity  . Alcohol use: Yes    Alcohol/week: 1.8 oz    Types: 3 Cans of beer per week    Comment: nothing to drink since last OV with Dr. Harl Bowie per patient  . Drug use: No  . Sexual activity: Yes  Lifestyle  . Physical activity:    Days per week: Not on file    Minutes per session: Not on file  . Stress: Not on file  Relationships  . Social connections:    Talks on phone: Not on file    Gets together: Not on file    Attends religious service: Not on file    Active member of club or organization: Not on file    Attends meetings of clubs or organizations: Not on file    Relationship status: Not on file  . Intimate partner violence:    Fear of current or ex partner: Not on file    Emotionally abused: Not on file    Physically abused: Not on file    Forced sexual activity: Not on file  Other Topics Concern  . Not on file  Social History Narrative   He smokes about a pack per day and he has been smoking since he was 52 years of age.  He drinks alcohol occasionally, but he denies any illicit drug abuse.  He is presently on disability.    Lives with wife in a 2 story home.  Has 2 children.   Previously worked in [employer], last worked in 1998.   Highest level of education:  11th grade            Physical Exam  Constitutional: He is oriented to person, place, and time.  Cardiovascular: Normal rate and regular rhythm.  Pulmonary/Chest: Breath sounds normal. He is in respiratory distress. He has no wheezes. He has no rales.  Abdominal: Soft.  Musculoskeletal: Normal range of motion. He exhibits edema.  Neurological: He is alert and oriented to person, place, and time.  Skin: Skin is warm and dry.        Future Appointments  Date Time Provider [place]  03/08/2018 11:00 AM Daron Offer, [name], MD [facility] None  03/25/2018 11:00 AM [facility] PA/NP [facility] None    BP 130/90 (BP Location: Left Arm, Patient  Position: Sitting, Cuff Size: Large)   Pulse 91   Resp 18   Wt (!) 446 lb (202.3 kg)   SpO2 96%   BMI 65.86 kg/m   Weight yesterday- 433 lb Last visit weight- n/a CBG- 351 mg/dL  [name] was seen at home today and reported feeling tired. He complained of mild SOB and orthopnea but denied headache or dizziness. When asked how many pillows he is sleeping with under his head, he replied "four or five."  He stated he could tell he had gained weight since yesterday because his legs were swollen but had no weighed himself prior to my arrival. Upon weighing himself, his weight had increased 13 lb from what he said it was yesterday. I contacted the clinic who advised to have him take 60 mg or torsemide for the next 3 days and then cut back to 40 mg daily. [name] was agreeable to this. His blood glucose was noted to be elevated as well and he said he had not started his metformin yet but he would begin today. I have contacted the clinic and requested they add this to his medication list. I will follow up next week to see if his weights are improving.   Time spent with patient: 32 minutes  Jacquiline Doe, EMT 03/04/18  ACTION: Home visit completed Next visit planned for 1 week

## 2018-03-04 NOTE — Telephone Encounter (Signed)
Advanced Heart Failure Triage Encounter  Patient Name: [name]  Date of Call: 03/04/18  Problem:  Zack (paramed) called due to pt gaining 13 lbs overnight, Pt has edema and dyspnea. Pt reported not taking torsemide yesterday.   Plan:  Per Jonni Sanger, [place] pt is to take 60 mg (3 tabs) Torsemide for 3 days and then take 40 mg (2 tabs) daily, and call if not better on Monday. Zack aware who will notify patient.   Shirley Muscat, RN

## 2018-03-04 NOTE — Telephone Encounter (Signed)
Received a message from [place] (paramedicine) that patient was started on metformin 750 mg daily. Will add to med list.   Doroteo Bradford K. Velva Harman, PharmD, BCPS, CPP Clinical Pharmacist Phone: [phone] 03/04/2018 4:39 PM

## 2018-03-08 ENCOUNTER — Ambulatory Visit (HOSPITAL_COMMUNITY): Payer: Medicare HMO | Admitting: Psychiatry

## 2018-03-09 ENCOUNTER — Ambulatory Visit (INDEPENDENT_AMBULATORY_CARE_PROVIDER_SITE_OTHER): Payer: Medicare HMO | Admitting: Psychiatry

## 2018-03-09 ENCOUNTER — Encounter (HOSPITAL_COMMUNITY): Payer: Self-pay | Admitting: Psychiatry

## 2018-03-09 VITALS — BP 106/70 | HR 93 | Ht 70.0 in | Wt >= 6400 oz

## 2018-03-09 DIAGNOSIS — F3162 Bipolar disorder, current episode mixed, moderate: Secondary | ICD-10-CM | POA: Diagnosis not present

## 2018-03-09 DIAGNOSIS — F411 Generalized anxiety disorder: Secondary | ICD-10-CM

## 2018-03-09 DIAGNOSIS — F17210 Nicotine dependence, cigarettes, uncomplicated: Secondary | ICD-10-CM

## 2018-03-09 DIAGNOSIS — F4312 Post-traumatic stress disorder, chronic: Secondary | ICD-10-CM | POA: Diagnosis not present

## 2018-03-09 DIAGNOSIS — R69 Illness, unspecified: Secondary | ICD-10-CM | POA: Diagnosis not present

## 2018-03-09 DIAGNOSIS — Z736 Limitation of activities due to disability: Secondary | ICD-10-CM | POA: Diagnosis not present

## 2018-03-09 MED ORDER — CLONAZEPAM 1 MG PO TABS: ORAL_TABLET | ORAL | 2 refills | Status: DC

## 2018-03-09 MED ORDER — SERTRALINE HCL 100 MG PO TABS: ORAL_TABLET | ORAL | 2 refills | Status: DC

## 2018-03-09 NOTE — Progress Notes (Signed)
[facility] MD/PA/NP OP Progress Note  03/09/2018 3:55 PM [name]  MRN:  [id number]  Chief Complaint: med management  HPI: [name] reports risperidone is not helping. Still depressed and feeling discouraged. Denies any aggressive thoughts towards others. Agrees to discontinue risperidone given lack of benefit. Feels that the lexapro was helpful but still not as much as he needed.  We agree to start trial of zoloft and increase to 100 mg in 10 days.  Continues clonazepam 1 mg TID for panic, anxiety, agitation.  Visit Diagnosis:    ICD-10-CM   1. GAD (generalized anxiety disorder) F41.1   2. Bipolar disorder, current episode mixed, moderate (HCC) F31.62 clonazePAM (KLONOPIN) 1 MG tablet  3. Chronic post-traumatic stress disorder (PTSD) F43.12     Past Psychiatric History: See intake H&P for full details. Reviewed, with no updates at this time.  Past Medical History:  Past Medical History:  Diagnosis Date  . Alcohol abuse   . Anxiety state, unspecified   . Atrial fibrillation ([place])   . CHF (congestive heart failure) ([place])   . Chronic systolic heart failure ([place])   . Diabetes mellitus, type II ([place])   . Edema   . History of medication noncompliance   . Migraine   . Obesity, unspecified   . Obstructive sleep apnea   . Psychiatric disorder   . Pulmonary embolism ([place])   . Shortness of breath     Past Surgical History:  Procedure Laterality Date  . CARDIAC CATHETERIZATION    . CARDIAC CATHETERIZATION N/A 08/13/2015   Procedure: Right/Left Heart Cath and Coronary Angiography;  Surgeon: [name], MD;  Location: [facility];  Service: Cardiovascular;  Laterality: N/A;  . RIGHT/LEFT HEART CATH AND CORONARY ANGIOGRAPHY N/A 04/14/2017   Procedure: Right/Left Heart Cath and Coronary Angiography;  Surgeon: [name], MD;  Location: [facility];  Service: Cardiovascular;  Laterality: N/A;  . TESTICLE SURGERY      Family Psychiatric History: See intake H&P  for full details. Reviewed, with no updates at this time.   Family History:  Family History  Problem Relation Age of Onset  . Cancer Mother        brain tumor  . Hypertension Mother   . Diabetes Father        Deceased, 66  . Heart disease Maternal Grandmother   . Hypertension Unknown        Family History  . Stroke Unknown        Family History  . Diabetes Unknown        Family History  . Diabetes Daughter     Social History:  Social History   Socioeconomic History  . Marital status: Married    Spouse name: Not on file  . Number of children: 3  . Years of education: Not on file  . Highest education level: Not on file  Occupational History  . Occupation: DISABLED  Social Needs  . Financial resource strain: Not on file  . Food insecurity:    Worry: Not on file    Inability: Not on file  . Transportation needs:    Medical: Not on file    Non-medical: Not on file  Tobacco Use  . Smoking status: Current Every Day Smoker    Packs/day: 0.50    Years: 30.00    Pack years: 15.00    Types: Cigarettes  . Smokeless tobacco: Never Used  . Tobacco comment: Took information today to call for  help with support to quit   Substance and Sexual Activity  . Alcohol use: Yes    Alcohol/week: 0.0 oz    Comment: Reports none in over a month, trying to quit   . Drug use: No  . Sexual activity: Not Currently  Lifestyle  . Physical activity:    Days per week: Not on file    Minutes per session: Not on file  . Stress: Not on file  Relationships  . Social connections:    Talks on phone: Not on file    Gets together: Not on file    Attends religious service: Not on file    Active member of club or organization: Not on file    Attends meetings of clubs or organizations: Not on file    Relationship status: Not on file  Other Topics Concern  . Not on file  Social History Narrative   He smokes about a pack per day and he has been smoking since he was 52 years of age.  He drinks  alcohol occasionally, but he denies any illicit drug abuse.  He is presently on disability.    Lives with wife in a 2 story home.  Has 2 children.   Previously worked in [employer], last worked in 1998.   Highest level of education:  11th grade            Allergies:  Allergies  Allergen Reactions  . Ace Inhibitors Anaphylaxis and Swelling  . Buspirone Other (See Comments)    dizziness    Metabolic Disorder Labs: Lab Results  Component Value Date   HGBA1C 7.3 (H) 02/02/2018   MPG 126 08/24/2017   MPG 128 04/12/2017   No results found for: PROLACTIN Lab Results  Component Value Date   CHOL 173 02/02/2018   TRIG 224.0 (H) 02/02/2018   HDL 33.60 (L) 02/02/2018   CHOLHDL 5 02/02/2018   VLDL 44.8 (H) 02/02/2018   LDLCALC 89 01/15/2017   LDLCALC 107 (H) 01/11/2016   Lab Results  Component Value Date   TSH 1.89 02/02/2018   TSH 2.542 03/18/2017    Therapeutic Level Labs: No results found for: LITHIUM No results found for: VALPROATE No components found for:  CBMZ  Current Medications: Current Outpatient Medications  Medication Sig Dispense Refill  . allopurinol (ZYLOPRIM) 100 MG tablet Take 1 tablet (100 mg total) by mouth daily. 90 tablet 1  . aspirin EC 81 MG tablet Take 1 tablet (81 mg total) by mouth daily. 90 tablet 3  . clonazePAM (KLONOPIN) 1 MG tablet TAKE 1 TABLET BY MOUTH 3 TIMES DAILY AS NEEDED FOR ANXIETY 90 tablet 2  . losartan (COZAAR) 50 MG tablet Take 1 tablet (50 mg total) by mouth 2 (two) times daily. 60 tablet 3  . magnesium oxide (MAG-OX) 400 MG tablet Take 400 mg by mouth daily as needed (cramping).    . metFORMIN (GLUCOPHAGE-XR) 750 MG 24 hr tablet Take 750 mg by mouth daily with breakfast.    . metoprolol succinate (TOPROL-XL) 100 MG 24 hr tablet Take 100 mg by mouth daily. Take with or immediately following a meal.    . NON FORMULARY Bipap, pressure 16/12 with 2L of O2    . pantoprazole (PROTONIX) 40 MG tablet TAKE 1 TABLET (40 MG TOTAL) BY MOUTH  DAILY. 90 tablet 3  . potassium chloride SA (K-DUR,KLOR-CON) 20 MEQ tablet Take 20 mEq by mouth daily as needed (cramping).    . sildenafil (REVATIO) 20 MG tablet Take 1  tablet (20 mg total) by mouth daily as needed. 15 tablet 11  . spironolactone (ALDACTONE) 25 MG tablet TAKE 1 TABLET (25 MG TOTAL) BY MOUTH DAILY. 30 tablet 6  . torsemide (DEMADEX) 20 MG tablet Take 2 tablets (40 mg total) by mouth daily. 60 tablet 11  . sertraline (ZOLOFT) 100 MG tablet Take 0.5 tablets (50 mg total) by mouth daily for 10 days, THEN 1 tablet (100 mg total) daily. 60 tablet 2   No current facility-administered medications for this visit.      Musculoskeletal: Strength & Muscle Tone: within normal limits Gait & Station: normal Patient leans: N/A  Psychiatric Specialty Exam: ROS  Blood pressure 106/70, pulse 93, height 5\' 10"  (1.778 m), weight (!) 447 lb (202.8 kg).Body mass index is 64.14 kg/m.  General Appearance: Casual and Fairly Groomed  Eye Contact:  Fair  Speech:  Clear and Coherent and Normal Rate  Volume:  Normal  Mood:  Depressed and Dysphoric  Affect:  Appropriate and Congruent  Thought Process:  Goal Directed and Descriptions of Associations: Intact  Orientation:  Full (Time, Place, and Person)  Thought Content: Logical   Suicidal Thoughts:  No  Homicidal Thoughts:  No  Memory:  Immediate;   Fair  Judgement:  Fair  Insight:  Fair  Psychomotor Activity:  Normal  Concentration:  Concentration: Fair  Recall:  [employer] of Knowledge: Fair  Language: Fair  Akathisia:  Negative  Handed:  Right  AIMS (if indicated): not done  Assets:  Communication Skills Desire for Improvement Financial Resources/Insurance Housing  ADL's:  Intact  Cognition: WNL  Sleep:  Fair   Screenings: PHQ2-9     Office Visit from 02/02/2018 in [facility] from 02/26/2015 in [facility]  PHQ-2 Total Score  0  5  PHQ-9  Total Score  -  11       Assessment and Plan:  [name] presents with ongoing anxiety and irritability.  Feels that the Lexapro was more helpful than the risperidone, but still not enough.  We agreed to initiate Zoloft and discontinue risperidone given lack of benefit.  No acute safety issues at this time, he continues on clonazepam for anxiety and agitation.  Spent time with him educating him on PTSD and how this can alter personality characteristics and mood characteristics of an individual.  He was receptive to this education.  1. GAD (generalized anxiety disorder)   2. Bipolar disorder, current episode mixed, moderate ([place])   3. Chronic post-traumatic stress disorder (PTSD)     Status of current problems: unchanged  Labs Ordered: No orders of the defined types were placed in this encounter.   Labs Reviewed: n/a  Collateral Obtained/Records Reviewed: n/a  Plan:  Clonazepam 1 mg 3 times daily Discontinue risperidone given lack of benefit Zoloft 50 mg x 10 days, then increase to 100 mg daily  I spent 20 minutes with the patient in direct face-to-face clinical care.  Greater than 50% of this time was spent in counseling and coordination of care with the patient.    Aundra Dubin, MD 03/09/2018, 3:55 PM

## 2018-03-10 ENCOUNTER — Telehealth (HOSPITAL_COMMUNITY): Payer: Self-pay

## 2018-03-10 NOTE — Telephone Encounter (Signed)
I called [name] to schedule an appointment. He stated that he was at home at the moment but was going to be leaving soo and asked if we could meet tomorrow around 13:45. That time fit my schedule so we agreed to meet then.

## 2018-03-11 ENCOUNTER — Other Ambulatory Visit (HOSPITAL_COMMUNITY): Payer: Self-pay

## 2018-03-11 ENCOUNTER — Other Ambulatory Visit (HOSPITAL_COMMUNITY): Payer: Self-pay | Admitting: Pharmacist

## 2018-03-11 MED ORDER — SPIRONOLACTONE 25 MG PO TABS: 25.0000 mg | ORAL_TABLET | Freq: Every day | ORAL | 6 refills | Status: DC

## 2018-03-11 NOTE — Progress Notes (Signed)
Paramedicine Encounter    Patient ID: [name], male    DOB: 1966-04-28, 52 y.o.   MRN: [id number]   Patient Care Team: [name], MD as PCP - General (Internal Medicine) [name], MD as Consulting Physician (Cardiology)  Patient Active Problem List   Diagnosis Date Noted  . Routine general medical examination at a health care facility 02/03/2018  . Idiopathic gout 02/02/2018  . Cough 02/02/2018  . RTI (respiratory tract infection) 02/02/2018  . Pyuria 02/02/2018  . PE (pulmonary thromboembolism) ([place]) 04/12/2017  . Hereditary and idiopathic peripheral neuropathy 07/16/2016  . Paroxysmal atrial fibrillation (HCC)   . Chronic anticoagulation, secondary to PE 03/2014 12/27/2014  . Osteoarthritis of both knees 12/27/2014  . Chronic systolic CHF (congestive heart failure) ([place])   . NSVT (nonsustained ventricular tachycardia) ([place]) 09/26/2013  . Alcohol abuse 08/10/2013  . Hyperlipidemia with target LDL less than 100 06/29/2013  . Nonischemic cardiomyopathy ([place]) 01/26/2013  . Type II diabetes mellitus with manifestations ([place]) 04/13/2012  . Tobacco user 02/18/2012  . Migraine 08/10/2011  . ED (erectile dysfunction) 07/11/2011  . GAD (generalized anxiety disorder) 06/12/2010  . Morbid obesity ([place]) 06/11/2010  . Essential hypertension 06/11/2010  . Systolic CHF, chronic ([place]) 06/11/2010  . Esophageal reflux 06/11/2010  . OSA (obstructive sleep apnea) with BiPap and oxygen 06/11/2010    Current Outpatient Medications:  .  allopurinol (ZYLOPRIM) 100 MG tablet, Take 1 tablet (100 mg total) by mouth daily., Disp: 90 tablet, Rfl: 1 .  aspirin EC 81 MG tablet, Take 1 tablet (81 mg total) by mouth daily., Disp: 90 tablet, Rfl: 3 .  clonazePAM (KLONOPIN) 1 MG tablet, TAKE 1 TABLET BY MOUTH 3 TIMES DAILY AS NEEDED FOR ANXIETY, Disp: 90 tablet, Rfl: 2 .  losartan (COZAAR) 50 MG tablet, Take 1 tablet (50 mg total) by mouth 2 (two) times daily., Disp: 60 tablet, Rfl:  3 .  magnesium oxide (MAG-OX) 400 MG tablet, Take 400 mg by mouth daily as needed (cramping)., Disp: , Rfl:  .  metFORMIN (GLUCOPHAGE-XR) 750 MG 24 hr tablet, Take 750 mg by mouth daily with breakfast., Disp: , Rfl:  .  metoprolol succinate (TOPROL-XL) 100 MG 24 hr tablet, Take 100 mg by mouth daily. Take with or immediately following a meal., Disp: , Rfl:  .  NON FORMULARY, Bipap, pressure 16/12 with 2L of O2, Disp: , Rfl:  .  pantoprazole (PROTONIX) 40 MG tablet, TAKE 1 TABLET (40 MG TOTAL) BY MOUTH DAILY., Disp: 90 tablet, Rfl: 3 .  potassium chloride SA (K-DUR,KLOR-CON) 20 MEQ tablet, Take 20 mEq by mouth daily as needed (cramping)., Disp: , Rfl:  .  sertraline (ZOLOFT) 100 MG tablet, Take 0.5 tablets (50 mg total) by mouth daily for 10 days, THEN 1 tablet (100 mg total) daily., Disp: 60 tablet, Rfl: 2 .  spironolactone (ALDACTONE) 25 MG tablet, TAKE 1 TABLET (25 MG TOTAL) BY MOUTH DAILY., Disp: 30 tablet, Rfl: 6 .  torsemide (DEMADEX) 20 MG tablet, Take 2 tablets (40 mg total) by mouth daily., Disp: 60 tablet, Rfl: 11 .  sildenafil (REVATIO) 20 MG tablet, Take 1 tablet (20 mg total) by mouth daily as needed. (Patient not taking: Reported on 03/11/2018), Disp: 15 tablet, Rfl: 11 Allergies  Allergen Reactions  . Ace Inhibitors Anaphylaxis and Swelling  . Buspirone Other (See Comments)    dizziness      Social History   Socioeconomic History  . Marital status: Married    Spouse name: Not  on file  . Number of children: 3  . Years of education: Not on file  . Highest education level: Not on file  Occupational History  . Occupation: DISABLED  Social Needs  . Financial resource strain: Not on file  . Food insecurity:    Worry: Not on file    Inability: Not on file  . Transportation needs:    Medical: Not on file    Non-medical: Not on file  Tobacco Use  . Smoking status: Current Every Day Smoker    Packs/day: 0.50    Years: 30.00    Pack years: 15.00    Types: Cigarettes  .  Smokeless tobacco: Never Used  . Tobacco comment: Took information today to call for help with support to quit   Substance and Sexual Activity  . Alcohol use: Yes    Alcohol/week: 0.0 oz    Comment: Reports none in over a month, trying to quit   . Drug use: No  . Sexual activity: Not Currently  Lifestyle  . Physical activity:    Days per week: Not on file    Minutes per session: Not on file  . Stress: Not on file  Relationships  . Social connections:    Talks on phone: Not on file    Gets together: Not on file    Attends religious service: Not on file    Active member of club or organization: Not on file    Attends meetings of clubs or organizations: Not on file    Relationship status: Not on file  . Intimate partner violence:    Fear of current or ex partner: Not on file    Emotionally abused: Not on file    Physically abused: Not on file    Forced sexual activity: Not on file  Other Topics Concern  . Not on file  Social History Narrative   He smokes about a pack per day and he has been smoking since he was 52 years of age.  He drinks alcohol occasionally, but he denies any illicit drug abuse.  He is presently on disability.    Lives with wife in a 2 story home.  Has 2 children.   Previously worked in [employer], last worked in 1998.   Highest level of education:  11th grade            Physical Exam  Constitutional: He is oriented to person, place, and time.  Cardiovascular: Normal rate and regular rhythm.  Pulmonary/Chest: Effort normal.  Abdominal: Soft.  Musculoskeletal: Normal range of motion. He exhibits edema.  Neurological: He is alert and oriented to person, place, and time.  Skin: Skin is warm and dry.  Psychiatric: He has a normal mood and affect.        Future Appointments  Date Time Provider [place]  03/25/2018 11:00 AM [facility] PA/NP [facility] None  03/30/2018 11:00 AM Archie Balboa, LCAS-A [facility] None  05/11/2018 10:30 AM [name], [name], MD [facility] None    BP 110/70 (BP Location: Left Arm, Patient Position: Sitting, Cuff Size: Large)   Pulse 88   Resp 18   Wt (!) 446 lb (202.3 kg)   SpO2 98%   BMI 63.99 kg/m   Weight yesterday- 448 lb Last visit weight- 446 lb  [name] was seen at home today and reported feeling well. He denied headache and dizziness but said he has had a few episodes of SOB. He says he struggles with generalized anxiety disorder  so he thinks these episodes are related to that. He continues to drink copious amounts of water but says he sweats a lot so he needs more water. I considered suggesting that he freeze water bottles and drink them through the day as they melt, however he said that very cold water has put him into SVT three times and a doctor told him to not drink ice cold water. I'm not sure of the full history surrounding these events but I gathered that he would not be receptive to the idea of frozen water bottles. His medications were verified and he has added vitamin B1 to his regimen. When we filled his pillbox, he wanted to fill it in a different way. He put morning medications in the first three rows and his night time meds in the bottom row and said he would refill the night time row weekly but would refill the top three rows every three weeks. I asked if this would be confusing and he said that it makes it simpler to him than refilling everything weekly. He ran out of spironolactone and was out of refills so I contacted [facility] to send in a new Rx. No other changes were noted.   Time spent with patient: 55 minutes  Jacquiline Doe, EMT 03/11/18  ACTION: Home visit completed Next visit planned for 1 week

## 2018-03-17 ENCOUNTER — Telehealth (HOSPITAL_COMMUNITY): Payer: Self-pay

## 2018-03-18 ENCOUNTER — Telehealth (HOSPITAL_COMMUNITY): Payer: Self-pay | Admitting: *Deleted

## 2018-03-18 ENCOUNTER — Other Ambulatory Visit (HOSPITAL_COMMUNITY): Payer: Self-pay

## 2018-03-18 DIAGNOSIS — I5022 Chronic systolic (congestive) heart failure: Secondary | ICD-10-CM

## 2018-03-18 NOTE — Telephone Encounter (Signed)
[name] called to report patient held his Torsemide because he has burning when he urinates patient said he has not been sexually active so does not think he has an STI. Pt weight and edema down. Per Jonni Sanger patient needs bmet/bnp tomorrow and we will make med changes based off of those results. Pt aware and agreeable on lab schedule tomorrow at 11:45am

## 2018-03-18 NOTE — Telephone Encounter (Signed)
I called Mr [name] to  Schedule an appointment. He stated he would be available for a visit tomorrow at 14:00. This time fit my schedule so we agreed to meet then.

## 2018-03-18 NOTE — Progress Notes (Signed)
Paramedicine Encounter    Patient ID: [name], male    DOB: [birth year], 52 y.o.   MRN: [id number]   Patient Care Team: [name], MD as PCP - General (Internal Medicine) [name], MD as Consulting Physician (Cardiology)  Patient Active Problem List   Diagnosis Date Noted  . Routine general medical examination at a health care facility 02/03/2018  . Idiopathic gout 02/02/2018  . Cough 02/02/2018  . RTI (respiratory tract infection) 02/02/2018  . Pyuria 02/02/2018  . PE (pulmonary thromboembolism) ([place]) 04/12/2017  . Hereditary and idiopathic peripheral neuropathy 07/16/2016  . Paroxysmal atrial fibrillation (HCC)   . Chronic anticoagulation, secondary to PE 03/2014 12/27/2014  . Osteoarthritis of both knees 12/27/2014  . Chronic systolic CHF (congestive heart failure) ([place])   . NSVT (nonsustained ventricular tachycardia) ([place]) 09/26/2013  . Alcohol abuse 08/10/2013  . Hyperlipidemia with target LDL less than 100 06/29/2013  . Nonischemic cardiomyopathy ([place]) 01/26/2013  . Type II diabetes mellitus with manifestations ([place]) 04/13/2012  . Tobacco user 02/18/2012  . Migraine 08/10/2011  . ED (erectile dysfunction) 07/11/2011  . GAD (generalized anxiety disorder) 06/12/2010  . Morbid obesity ([place]) 06/11/2010  . Essential hypertension 06/11/2010  . Systolic CHF, chronic ([place]) 06/11/2010  . Esophageal reflux 06/11/2010  . OSA (obstructive sleep apnea) with BiPap and oxygen 06/11/2010    Current Outpatient Medications:  .  allopurinol (ZYLOPRIM) 100 MG tablet, Take 1 tablet (100 mg total) by mouth daily., Disp: 90 tablet, Rfl: 1 .  aspirin EC 81 MG tablet, Take 1 tablet (81 mg total) by mouth daily., Disp: 90 tablet, Rfl: 3 .  clonazePAM (KLONOPIN) 1 MG tablet, TAKE 1 TABLET BY MOUTH 3 TIMES DAILY AS NEEDED FOR ANXIETY, Disp: 90 tablet, Rfl: 2 .  losartan (COZAAR) 50 MG tablet, Take 1 tablet (50 mg total) by mouth 2 (two) times daily., Disp: 60 tablet, Rfl:  3 .  magnesium oxide (MAG-OX) 400 MG tablet, Take 400 mg by mouth daily as needed (cramping)., Disp: , Rfl:  .  metFORMIN (GLUCOPHAGE-XR) 750 MG 24 hr tablet, Take 750 mg by mouth daily with breakfast., Disp: , Rfl:  .  metoprolol succinate (TOPROL-XL) 100 MG 24 hr tablet, Take 100 mg by mouth daily. Take with or immediately following a meal., Disp: , Rfl:  .  NON FORMULARY, Bipap, pressure 16/12 with 2L of O2, Disp: , Rfl:  .  pantoprazole (PROTONIX) 40 MG tablet, TAKE 1 TABLET (40 MG TOTAL) BY MOUTH DAILY., Disp: 90 tablet, Rfl: 3 .  potassium chloride SA (K-DUR,KLOR-CON) 20 MEQ tablet, Take 20 mEq by mouth daily as needed (cramping)., Disp: , Rfl:  .  sertraline (ZOLOFT) 100 MG tablet, Take 0.5 tablets (50 mg total) by mouth daily for 10 days, THEN 1 tablet (100 mg total) daily., Disp: 60 tablet, Rfl: 2 .  sildenafil (REVATIO) 20 MG tablet, Take 1 tablet (20 mg total) by mouth daily as needed. (Patient not taking: Reported on 03/11/2018), Disp: 15 tablet, Rfl: 11 .  spironolactone (ALDACTONE) 25 MG tablet, Take 1 tablet (25 mg total) by mouth daily., Disp: 30 tablet, Rfl: 6 .  torsemide (DEMADEX) 20 MG tablet, Take 2 tablets (40 mg total) by mouth daily., Disp: 60 tablet, Rfl: 11 Allergies  Allergen Reactions  . Ace Inhibitors Anaphylaxis and Swelling  . Buspirone Other (See Comments)    dizziness      Social History   Socioeconomic History  . Marital status: Married    Spouse name: Not  on file  . Number of children: 3  . Years of education: Not on file  . Highest education level: Not on file  Occupational History  . Occupation: DISABLED  Social Needs  . Financial resource strain: Not on file  . Food insecurity:    Worry: Not on file    Inability: Not on file  . Transportation needs:    Medical: Not on file    Non-medical: Not on file  Tobacco Use  . Smoking status: Current Every Day Smoker    Packs/day: 0.50    Years: 30.00    Pack years: 15.00    Types: Cigarettes  .  Smokeless tobacco: Never Used  . Tobacco comment: Took information today to call for help with support to quit   Substance and Sexual Activity  . Alcohol use: Yes    Alcohol/week: 0.0 oz    Comment: Reports none in over a month, trying to quit   . Drug use: No  . Sexual activity: Not Currently  Lifestyle  . Physical activity:    Days per week: Not on file    Minutes per session: Not on file  . Stress: Not on file  Relationships  . Social connections:    Talks on phone: Not on file    Gets together: Not on file    Attends religious service: Not on file    Active member of club or organization: Not on file    Attends meetings of clubs or organizations: Not on file    Relationship status: Not on file  . Intimate partner violence:    Fear of current or ex partner: Not on file    Emotionally abused: Not on file    Physically abused: Not on file    Forced sexual activity: Not on file  Other Topics Concern  . Not on file  Social History Narrative   He smokes about a pack per day and he has been smoking since he was 52 years of age.  He drinks alcohol occasionally, but he denies any illicit drug abuse.  He is presently on disability.    Lives with wife in a 2 story home.  Has 2 children.   Previously worked in [employer], last worked in 1998.   Highest level of education:  11th grade            Physical Exam  Constitutional: He is oriented to person, place, and time.  Cardiovascular: Normal rate and regular rhythm.  Pulmonary/Chest: Effort normal and breath sounds normal.  Abdominal: Soft. He exhibits no distension.  Musculoskeletal: Normal range of motion. He exhibits no edema.  Neurological: He is alert and oriented to person, place, and time.  Skin: Skin is warm and dry.  Psychiatric: He has a normal mood and affect.        Future Appointments  Date Time Provider [place]  03/19/2018 11:45 AM [facility] None  03/25/2018 11:00 AM [facility] PA/NP [facility]  None  03/30/2018 11:00 AM Archie Balboa, LCAS-A [facility] None  05/11/2018 10:30 AM [name], [name], MD [facility] None    BP 114/80 (BP Location: Left Arm, Patient Position: Sitting, Cuff Size: Large)   Pulse 73   Resp 16   Wt (!) 432 lb (196 kg)   SpO2 97%   BMI 61.99 kg/m   Weight yesterday- 434 lb Last visit weight- 446 lb  Mr Towell was see at home today and reported feeling tired, complained of headaches and dizziness. He  stated that he had been taking his medications as prescribed until the last couple of days when the aforementioned symptoms began. He said at that point he started only taking metoprolol and his BH medications. He also stated he was not urinating very much. I contacted the clinic who stated they would like see him tomorrow for blood work. I passed this along to his and he said he would be there.   Time spent with patient: 24 minutes  Jacquiline Doe, EMT 03/18/18  ACTION: Home visit completed Next visit planned for 1 week

## 2018-03-19 ENCOUNTER — Ambulatory Visit (HOSPITAL_COMMUNITY)
Admission: RE | Admit: 2018-03-19 | Discharge: 2018-03-19 | Disposition: A | Discharge status: Home / Self Care | Payer: Medicare HMO | Source: Ambulatory Visit | Attending: Cardiology | Admitting: Cardiology

## 2018-03-19 DIAGNOSIS — I5022 Chronic systolic (congestive) heart failure: Secondary | ICD-10-CM | POA: Diagnosis not present

## 2018-03-19 LAB — BASIC METABOLIC PANEL
ANION GAP: 9 (ref 5–15)
BUN: 13 mg/dL (ref 6–20)
CO2: 23 mmol/L (ref 22–32)
Calcium: 9.1 mg/dL (ref 8.9–10.3)
Chloride: 107 mmol/L (ref 101–111)
Creatinine, Ser: 1.15 mg/dL (ref 0.61–1.24)
GFR calc Af Amer: >60 mL/min (ref >60)
GLUCOSE: 133 mg/dL — AB (ref 65–99)
Potassium: 4.1 mmol/L (ref 3.5–5.1)
Sodium: 139 mmol/L (ref 135–145)

## 2018-03-19 LAB — BRAIN NATRIURETIC PEPTIDE: B NATRIURETIC PEPTIDE 5: 212.1 pg/mL — AB (ref 0.0–100.0)

## 2018-03-25 ENCOUNTER — Other Ambulatory Visit (HOSPITAL_COMMUNITY): Payer: Self-pay

## 2018-03-25 ENCOUNTER — Encounter (HOSPITAL_COMMUNITY): Payer: Self-pay

## 2018-03-25 ENCOUNTER — Encounter (HOSPITAL_COMMUNITY): Payer: Self-pay | Admitting: *Deleted

## 2018-03-25 ENCOUNTER — Ambulatory Visit (HOSPITAL_COMMUNITY)
Admission: RE | Admit: 2018-03-25 | Discharge: 2018-03-25 | Disposition: A | Discharge status: Home / Self Care | Payer: Medicare HMO | Source: Ambulatory Visit | Attending: Internal Medicine | Admitting: Internal Medicine

## 2018-03-25 VITALS — BP 120/80 | HR 72 | Wt >= 6400 oz

## 2018-03-25 DIAGNOSIS — F17210 Nicotine dependence, cigarettes, uncomplicated: Secondary | ICD-10-CM | POA: Insufficient documentation

## 2018-03-25 DIAGNOSIS — E669 Obesity, unspecified: Secondary | ICD-10-CM | POA: Diagnosis not present

## 2018-03-25 DIAGNOSIS — F419 Anxiety disorder, unspecified: Secondary | ICD-10-CM | POA: Diagnosis not present

## 2018-03-25 DIAGNOSIS — Z7984 Long term (current) use of oral hypoglycemic drugs: Secondary | ICD-10-CM | POA: Diagnosis not present

## 2018-03-25 DIAGNOSIS — I5022 Chronic systolic (congestive) heart failure: Secondary | ICD-10-CM | POA: Diagnosis not present

## 2018-03-25 DIAGNOSIS — E119 Type 2 diabetes mellitus without complications: Secondary | ICD-10-CM | POA: Diagnosis not present

## 2018-03-25 DIAGNOSIS — Z6841 Body Mass Index (BMI) 40.0 and over, adult: Secondary | ICD-10-CM | POA: Diagnosis not present

## 2018-03-25 DIAGNOSIS — K219 Gastro-esophageal reflux disease without esophagitis: Secondary | ICD-10-CM | POA: Diagnosis not present

## 2018-03-25 DIAGNOSIS — F17200 Nicotine dependence, unspecified, uncomplicated: Secondary | ICD-10-CM | POA: Diagnosis not present

## 2018-03-25 DIAGNOSIS — I480 Paroxysmal atrial fibrillation: Secondary | ICD-10-CM | POA: Diagnosis not present

## 2018-03-25 DIAGNOSIS — I110 Hypertensive heart disease with heart failure: Secondary | ICD-10-CM | POA: Insufficient documentation

## 2018-03-25 DIAGNOSIS — I429 Cardiomyopathy, unspecified: Secondary | ICD-10-CM | POA: Diagnosis not present

## 2018-03-25 DIAGNOSIS — I428 Other cardiomyopathies: Secondary | ICD-10-CM

## 2018-03-25 DIAGNOSIS — Z7982 Long term (current) use of aspirin: Secondary | ICD-10-CM | POA: Diagnosis not present

## 2018-03-25 DIAGNOSIS — E118 Type 2 diabetes mellitus with unspecified complications: Secondary | ICD-10-CM

## 2018-03-25 DIAGNOSIS — R69 Illness, unspecified: Secondary | ICD-10-CM | POA: Diagnosis not present

## 2018-03-25 DIAGNOSIS — G4733 Obstructive sleep apnea (adult) (pediatric): Secondary | ICD-10-CM | POA: Diagnosis not present

## 2018-03-25 DIAGNOSIS — Z79899 Other long term (current) drug therapy: Secondary | ICD-10-CM | POA: Insufficient documentation

## 2018-03-25 LAB — BASIC METABOLIC PANEL
Anion gap: 11 (ref 5–15)
BUN: 13 mg/dL (ref 6–20)
CHLORIDE: 105 mmol/L (ref 101–111)
CO2: 23 mmol/L (ref 22–32)
CREATININE: 1.14 mg/dL (ref 0.61–1.24)
Calcium: 8.8 mg/dL — ABNORMAL LOW (ref 8.9–10.3)
GFR calc Af Amer: >60 mL/min (ref >60)
GFR calc non Af Amer: >60 mL/min (ref >60)
GLUCOSE: 144 mg/dL — AB (ref 65–99)
POTASSIUM: 3.5 mmol/L (ref 3.5–5.1)
Sodium: 139 mmol/L (ref 135–145)

## 2018-03-25 LAB — HEMOGLOBIN A1C
HEMOGLOBIN A1C: 7.6 % — AB (ref 4.8–5.6)
Mean Plasma Glucose: 171.42 mg/dL

## 2018-03-25 NOTE — Progress Notes (Addendum)
Patient referred to Clinical Exercise Physiologist by [name] for guidance and discussion about safe home exercises and/or starting an exercise program. Exercises were demonstrated and detailed with safety precautions to patient and accompanying caregiver (if present). Patient was presented with an information packet including demonstrations of the exercises discussed. All patient's questions were answered and patient was given contact information for further questions or concerns regarding their exercise.     Adam Martins, MS, ACSM-RCEP Clinical Exercise Physiologist

## 2018-03-25 NOTE — Patient Instructions (Signed)
Routine lab work today. Will notify you of abnormal results, otherwise no news is good news!  Will refer you to Cardiac Rehab at [facility]. They will call you to set up initial appointment.  Follow up 8 weeks with echocardiogram and appointment with Dr. Aundra Dubin.  ______________________________________________________________ [name] Code:  Take all medication as prescribed the day of your appointment. Bring all medications with you to your appointment.  Do the following things EVERYDAY: 1) Weigh yourself in the morning before breakfast. Write it down and keep it in a log. 2) Take your medicines as prescribed 3) Eat low salt foods-Limit salt (sodium) to 2000 mg per day.  4) Stay as active as you can everyday 5) Limit all fluids for the day to less than 2 liters

## 2018-03-25 NOTE — Progress Notes (Signed)
Advanced Heart Failure Clinic Note   Primary Care:Dr. Buelah Manis, Modena Nunnery, MD Primary Cardiologist: Dr. Aundra Dubin   HPI: Mr. Vanwart is a 52 year old male with a past medical history of NICM, EF 25-30% in March 2018, felt to be related to prior ETOH abuse. He also has a history of PE 03/2014 completed a years course of Xarelto, morbid obesity, OSA, and tobacco abuse.   He was admitted 11/12/15-11/14/15 with acute on chronic systolic CHF and palpitations. He wore a 30 day event monitor at discharge as he had frequent PVC's and questionable Afib on telemetry. Also with some NSVT, so he was started on Amiodaone, but at follow up had not started taking it. He had previously refused ICD and was seen inpatient by Dr. Rayann Heman who felt that his morbid obesity was a prohibitive factor.   He was seen in the clinic in April 2018. He had started drinking ETOH again. Volume status was stable, he is not an Entresto candidate due to angioedema with lisinopril. Weight was 411 pounds.   Admitted 04/12/17 with SOB, chest pain. D- dimer was 1.16, chest CT without central obstructing PE, however more peripheral and subsegmental pulmonary artery branches were not confidently evaluated due to his body habitus. He was started on a heparin gtt for presumed PE, however VQ scan showed no PE. Troponin was elevated, peaked at 3.37. LHC showed no CAD. Echo showed an EF of 15%, grade 2 DD, no pericarditis. He was diuresed with IV lasix, and started on torsemide 42m at discharge. Discharge weight was 405 pounds.   Today he returns for HF follow up. Last visit torsemide was cut back to 20 mg daily. Overall feeling better. SOB with exertion. Denies PND/orthopnea.  Appetite ok. Says he is trying eat more salads.No fever or chills. Weight at home has been going down. Taking medications intermittently.   Labs (5/13): K 4.1, creatinine 1.05 Labs (1/14): K 3.8, creatinine 1.16, BNP 54 Labs (2/14): K 3.7, creatinine 1.11, BNP 28 Labs  (2/16): K 4.1, creatinine 1.03, LDL 96, HCT 40 Labs (3/16): K 3.7, K 1.13, BNP 367 Labs (8/16): BNP 43, K 3.5, creatinine 1.01 Labs (08/13/15): K 3.7, creatinine 1.18, HCT 41.3 Labs (12/16): K 3.7, creatinine 1.14, TGs 495, digoxin < 0.2 Labs (2/17): K 3.8, creatinine 0.99, LDL 107, TGs 222 Labs (5/17): K 4, creatinine 1.47 Labs (2/18): K 3.9, creatinine 1.06, hgb 15.1, TGs 163, LDL 89, HDL 28, TSH normal  Labs (5/18): K 3.8, creatinine 1.12.  Labs (12/30/2017): K 3.8 Creatinine 1.25  Labs (01/28/2018): K 3.8 Creatinine 1.08 Labs 03/19/2018: K 4.1 Creatinine 1/15 BNP 212   PMH: 1. Nonischemic cardiomyopathy: Prior cath with no significant CAD.  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  Prior echoes with EF as low as 25%.  Echo (9/13) with EF 35-40%, moderate to severe LV dilation, diffuse hypokinesis, mild MR. Echo (5/15) with EF 30-35%, moderate to severe LAE, normal RV size and systolic function.  Angioedema with ACEI, headaches with hydralazine/nitrates. Echo (3/16) with EF 25-30%, severe LV dilation, normal RV size and systolic function.  [facility]; RA mean 6, PA 33/11 mean 23, PCWP mean 13, Fick CO/CI 4.75 /1.68 (difficult study, radial artery spasm, if needs future cath would use groin).  Echo (9/16) showed EF 20-25%.   - Echo (3/18): EF 25-30%, moderate LAE, Echo 4/18 EF 15%  2. HTN: angioedema with ACEI.  3. OSA: on Bipap 4.  Morbid obesity 5. Paroxysmal atrial fibrillation: Not documented recently.   6. Smoker.  7. Anxiety/panic attacks 8. PE: 5/15, diagnosed by V/Q scan. CTA chest 8/16 negative for PE.  9. NSVT, PVCs: 30 day monitor (12/16) with PVCs, PACs, no atrial fibrillation.  10. Hematuria: Apparently had negative workup by urology.  11. ABIs (6/16) were normal 12. 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  13. Gout 14. Low back pain.   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  Has son and daughter.    FH: No premature CAD.    ROS: All systems reviewed and negative except as per HPI.   Past Medical History:  Diagnosis Date  . Alcohol abuse   . Anxiety state, unspecified   . Atrial fibrillation ([place])   . CHF (congestive heart failure) ([place])   . Chronic systolic heart failure ([place])   . Diabetes mellitus, type II ([place])   . Edema   . History of medication noncompliance   . Migraine   . Obesity, unspecified   . Obstructive sleep apnea   . Psychiatric disorder   . Pulmonary embolism ([place])   . Shortness of breath     Current Outpatient Medications  Medication Sig Dispense Refill  . allopurinol (ZYLOPRIM) 100 MG tablet Take 1 tablet (100 mg total) by mouth daily. 90 tablet 1  . aspirin EC 81 MG tablet Take 1 tablet (81 mg total) by mouth daily. 90 tablet 3  . clonazePAM (KLONOPIN) 1 MG tablet TAKE 1 TABLET BY MOUTH 3 TIMES DAILY AS NEEDED FOR ANXIETY 90 tablet 2  . losartan (COZAAR) 50 MG tablet Take 1 tablet (50 mg total) by mouth 2 (two) times daily. 60 tablet 3  . magnesium oxide (MAG-OX) 400 MG tablet Take 400 mg by mouth daily as needed (cramping).    . metFORMIN (GLUCOPHAGE-XR) 750 MG 24 hr tablet Take 750 mg by mouth daily with breakfast.    . metoprolol succinate (TOPROL-XL) 100 MG 24 hr tablet Take 100 mg by mouth daily. Take with or immediately following a meal.    . NON FORMULARY Bipap, pressure 16/12 with 2L of O2    . pantoprazole (PROTONIX) 40 MG tablet TAKE 1 TABLET (40 MG TOTAL) BY MOUTH DAILY. 90 tablet 3  . potassium chloride SA (K-DUR,KLOR-CON) 20 MEQ tablet Take 20 mEq by mouth daily as needed (cramping).    . sertraline (ZOLOFT) 100 MG tablet Take 0.5 tablets (50 mg total) by mouth daily for 10 days, THEN 1 tablet (100 mg total) daily. 60 tablet 2  . sildenafil (REVATIO) 20 MG tablet Take 1 tablet (20 mg total) by mouth daily as needed. (Patient not taking: Reported on 03/11/2018) 15 tablet 11    . spironolactone (ALDACTONE) 25 MG tablet Take 1 tablet (25 mg total) by mouth daily. 30 tablet 6  . torsemide (DEMADEX) 20 MG tablet Take 2 tablets (40 mg total) by mouth daily. 60 tablet 11   No current facility-administered medications for this encounter.     Allergies  Allergen Reactions  . Ace Inhibitors Anaphylaxis and Swelling  . Buspirone Other (See Comments)    dizziness      Social History   Socioeconomic History  . Marital status: Married    Spouse name: Not on file  . Number of children: 3  . Years of education: Not on file  . Highest education level: Not on file  Occupational History  . Occupation: DISABLED  Social Needs  . Financial resource strain: Not on file  . Food insecurity:    Worry: Not on file    Inability: Not on file  . Transportation needs:    Medical: Not on file    Non-medical: Not on file  Tobacco Use  . Smoking status: Current Every Day Smoker    Packs/day: 0.50    Years: 30.00    Pack years: 15.00    Types: Cigarettes  . Smokeless tobacco: Never Used  . Tobacco comment: Took information today to call for help with support to quit   Substance and Sexual Activity  . Alcohol use: Yes    Alcohol/week: 0.0 oz    Comment: Reports none in over a month, trying to quit   . Drug use: No  . Sexual activity: Not Currently  Lifestyle  . Physical activity:    Days per week: Not on file    Minutes per session: Not on file  . Stress: Not on file  Relationships  . Social connections:    Talks on phone: Not on file    Gets together: Not on file    Attends religious service: Not on file    Active member of club or organization: Not on file    Attends meetings of clubs or organizations: Not on file    Relationship status: Not on file  . Intimate partner violence:    Fear of current or ex partner: Not on file    Emotionally abused: Not on file    Physically abused: Not on file    Forced sexual activity: Not on file  Other Topics Concern  .  Not on file  Social History Narrative   He smokes about a pack per day and he has been smoking since he was 52 years of age.  He drinks alcohol occasionally, but he denies any illicit drug abuse.  He is presently on disability.    Lives with wife in a 2 story home.  Has 2 children.   Previously worked in [employer], last worked in 1998.   Highest level of education:  11th grade              Family History  Problem Relation Age of Onset  . Cancer Mother        brain tumor  . Hypertension Mother   . Diabetes Father        Deceased, 34  . Heart disease Maternal Grandmother   . Hypertension Unknown        Family History  . Stroke Unknown        Family History  . Diabetes Unknown        Family History  . Diabetes Daughter     Vitals:   03/25/18 1106  BP: 120/80  Pulse: 72  SpO2: 96%  Weight: (!) 428 lb 6.4 oz (194.3 kg)   Filed Weights   03/25/18 1106  Weight: (!) 428 lb 6.4 oz (194.3 kg)   Wt Readings from Last 3 Encounters:  03/25/18 (!) 428 lb 6.4 oz (194.3 kg)  03/18/18 (!) 432 lb (196 kg)  03/11/18 (!) 446 lb (202.3 kg)      PHYSICAL EXAM: General:  Well appearing. No resp difficulty> walked in the clinic without difficulty.  HEENT: normal Neck: supple. no JVD. Carotids 2+ bilat; no bruits. No lymphadenopathy or thryomegaly appreciated. Cor: PMI nondisplaced. Regular rate & rhythm. No rubs, gallops or murmurs. Lungs: clear Abdomen: obese, soft, nontender, nondistended. No hepatosplenomegaly. No  bruits or masses. Good bowel sounds. Extremities: no cyanosis, clubbing, rash, edema Neuro: alert & orientedx3, cranial nerves grossly intact. moves all 4 extremities w/o difficulty. Affect pleasant  ASSESSMENT & PLAN: 1. Chronic systolic CHF: Nonischemic cardiomyopathy. Echo (3/18) with EF 25-30%. He was seen by EP and decided against ICD given marked obesity.  QRS not wide enough for CRT. Echo 4/18 EF 15%.  NYHA II-III. Volume status stable. Continue torsemide 20 mg  daily.  - Continue Spiro 25 mg daily - Continue Toprol XL 169m will not increase with dizziness.  -Continue losartan 50 mg twice a day. He does not want to take once a day.  -No entresto with h/o angioedema.  Had side effects from digoxin, does not want to take. - Headaches with Bidil, cannot take.  - Check BMET today.  2. Obesity:  Body mass index is 61.47 kg/m.  He has information to pursue weight loss surgery. Today he met with our exercise physiologist for home exercise program.  3. Smoking:  - discussed cessation.   4. OSA:  - Continue nightly 5. Paroxysmal atrial fibrillation:  Regular pulse.  Has not been anticoagulated in 3 years. He does not want start anticoagulatns f 6. PE: 5/15, diagnosed by V/Q scan.  He was on Xarelto for about a year but stopped in the setting of hematuria.  - Recent hospitalization without PE on CT and VQ scan. Not on anticoagulation. Does not want to anticoagulants.  7. Anxiety - Has follow up with psychiatry.   8. GERD   Follow up in 8 weeks with an ECHO and Dr MAundra Dubin Discussed medication compliance. I did not make medication changes because he is not taking medications as directed. He seems to take intermittently. He is hard to manage.    ADarrick Grinder NP 03/25/18

## 2018-03-25 NOTE — Progress Notes (Signed)
Paramedicine Encounter   Patient ID: [name] , male,   DOB: [birth year],51 y.o.,  MRN: [id number]  Mr Gindlesperger was seen at the HF clinic today and reported feeling well. He stated he is losing weight by eating only salads however he is not compliant with his medications. He stated that he was taking his medicine they way he thinks he should based on how he feels. Amy advised he should stick to taking the medications as prescribed but he seems reluctant to change what he is doing. No changes were made to his medications at todays appointment because it is difficult to see what is working and what need to be chanced since he is altering his medications without consultation.   Time spent with patient: 27 minutes  Jacquiline Doe, EMT 03/25/2018   ACTION: Next visit planned for 1 week

## 2018-03-29 DIAGNOSIS — I509 Heart failure, unspecified: Secondary | ICD-10-CM | POA: Diagnosis not present

## 2018-03-29 DIAGNOSIS — I110 Hypertensive heart disease with heart failure: Secondary | ICD-10-CM | POA: Diagnosis not present

## 2018-03-29 DIAGNOSIS — I4891 Unspecified atrial fibrillation: Secondary | ICD-10-CM | POA: Diagnosis not present

## 2018-03-29 DIAGNOSIS — E119 Type 2 diabetes mellitus without complications: Secondary | ICD-10-CM | POA: Diagnosis not present

## 2018-03-29 DIAGNOSIS — Z6841 Body Mass Index (BMI) 40.0 and over, adult: Secondary | ICD-10-CM | POA: Diagnosis not present

## 2018-03-29 DIAGNOSIS — G4730 Sleep apnea, unspecified: Secondary | ICD-10-CM | POA: Diagnosis not present

## 2018-03-29 DIAGNOSIS — R69 Illness, unspecified: Secondary | ICD-10-CM | POA: Diagnosis not present

## 2018-03-30 ENCOUNTER — Ambulatory Visit (HOSPITAL_COMMUNITY): Payer: Self-pay | Admitting: Licensed Clinical Social Worker

## 2018-03-31 DIAGNOSIS — I10 Essential (primary) hypertension: Secondary | ICD-10-CM | POA: Diagnosis not present

## 2018-03-31 DIAGNOSIS — I509 Heart failure, unspecified: Secondary | ICD-10-CM | POA: Diagnosis not present

## 2018-03-31 DIAGNOSIS — G4733 Obstructive sleep apnea (adult) (pediatric): Secondary | ICD-10-CM | POA: Diagnosis not present

## 2018-04-01 ENCOUNTER — Telehealth (HOSPITAL_COMMUNITY): Payer: Self-pay

## 2018-04-01 DIAGNOSIS — F411 Generalized anxiety disorder: Secondary | ICD-10-CM

## 2018-04-01 MED ORDER — SERTRALINE HCL 100 MG PO TABS: 100.0000 mg | ORAL_TABLET | Freq: Every day | ORAL | 0 refills | Status: DC

## 2018-04-01 NOTE — Telephone Encounter (Signed)
Medication refill request - fax received from [facility]'s [facility] in [place] requesting a 90 day order for patient's prescribed Sertraline 100 mg.

## 2018-04-01 NOTE — Telephone Encounter (Signed)
A new 90 day order of Sertraline 100 mg, one a day e-scribed to [facility]'s [facility] in [place] as authorized by Dr. Daron Offer this date.

## 2018-04-01 NOTE — Telephone Encounter (Signed)
I called [name] to schedule an appointment. He stated that he was not available today but could meet tomorrow morning. We discussed times and agreed upon 11:30 to meet at his house.

## 2018-04-01 NOTE — Telephone Encounter (Signed)
Oh yes that would be fine 

## 2018-04-02 ENCOUNTER — Other Ambulatory Visit (HOSPITAL_COMMUNITY): Payer: Self-pay

## 2018-04-02 NOTE — Progress Notes (Signed)
Paramedicine Encounter    Patient ID: LONG BRIMAGE, male    DOB: September 10, 1966, 52 y.o.   MRN: [id number]   Patient Care Team: [name], MD as PCP - General (Internal Medicine) [name], MD as Consulting Physician (Cardiology)  Patient Active Problem List   Diagnosis Date Noted  . Routine general medical examination at a health care facility 02/03/2018  . Idiopathic gout 02/02/2018  . Cough 02/02/2018  . RTI (respiratory tract infection) 02/02/2018  . Pyuria 02/02/2018  . PE (pulmonary thromboembolism) ([place]) 04/12/2017  . Hereditary and idiopathic peripheral neuropathy 07/16/2016  . Paroxysmal atrial fibrillation ([facility])   . Chronic anticoagulation, secondary to PE 03/2014 12/27/2014  . Osteoarthritis of both knees 12/27/2014  . Chronic systolic CHF (congestive heart failure) ([place])   . NSVT (nonsustained ventricular tachycardia) ([place]) 09/26/2013  . Alcohol abuse 08/10/2013  . Hyperlipidemia with target LDL less than 100 06/29/2013  . Nonischemic cardiomyopathy ([place]) 01/26/2013  . Type II diabetes mellitus with manifestations ([place]) 04/13/2012  . Tobacco user 02/18/2012  . Migraine 08/10/2011  . ED (erectile dysfunction) 07/11/2011  . GAD (generalized anxiety disorder) 06/12/2010  . Morbid obesity ([place]) 06/11/2010  . Essential hypertension 06/11/2010  . Systolic CHF, chronic ([place]) 06/11/2010  . Esophageal reflux 06/11/2010  . OSA (obstructive sleep apnea) with BiPap and oxygen 06/11/2010    Current Outpatient Medications:  .  aspirin EC 81 MG tablet, Take 1 tablet (81 mg total) by mouth daily., Disp: 90 tablet, Rfl: 3 .  clonazePAM (KLONOPIN) 1 MG tablet, TAKE 1 TABLET BY MOUTH 3 TIMES DAILY AS NEEDED FOR ANXIETY, Disp: 90 tablet, Rfl: 2 .  losartan (COZAAR) 50 MG tablet, Take 1 tablet (50 mg total) by mouth 2 (two) times daily., Disp: 60 tablet, Rfl: 3 .  metFORMIN (GLUCOPHAGE-XR) 750 MG 24 hr tablet, Take 750 mg by mouth daily with breakfast., Disp: , Rfl:  .   metoprolol succinate (TOPROL-XL) 100 MG 24 hr tablet, Take 100 mg by mouth daily. Take with or immediately following a meal., Disp: , Rfl:  .  NON FORMULARY, Bipap, pressure 16/12 with 2L of O2, Disp: , Rfl:  .  pantoprazole (PROTONIX) 40 MG tablet, TAKE 1 TABLET (40 MG TOTAL) BY MOUTH DAILY., Disp: 90 tablet, Rfl: 3 .  potassium chloride SA (K-DUR,KLOR-CON) 20 MEQ tablet, Take 20 mEq by mouth daily as needed (cramping)., Disp: , Rfl:  .  sertraline (ZOLOFT) 100 MG tablet, Take 1 tablet (100 mg total) by mouth daily., Disp: 90 tablet, Rfl: 0 .  spironolactone (ALDACTONE) 25 MG tablet, Take 1 tablet (25 mg total) by mouth daily., Disp: 30 tablet, Rfl: 6 .  torsemide (DEMADEX) 20 MG tablet, Take 2 tablets (40 mg total) by mouth daily., Disp: 60 tablet, Rfl: 11 .  allopurinol (ZYLOPRIM) 100 MG tablet, Take 1 tablet (100 mg total) by mouth daily. (Patient not taking: Reported on 04/02/2018), Disp: 90 tablet, Rfl: 1 .  magnesium oxide (MAG-OX) 400 MG tablet, Take 400 mg by mouth daily as needed (cramping)., Disp: , Rfl:  .  sildenafil (REVATIO) 20 MG tablet, Take 1 tablet (20 mg total) by mouth daily as needed. (Patient not taking: Reported on 04/02/2018), Disp: 15 tablet, Rfl: 11 Allergies  Allergen Reactions  . Ace Inhibitors Anaphylaxis and Swelling  . Buspirone Other (See Comments)    dizziness      Social History   Socioeconomic History  . Marital status: Married    Spouse name: Not on file  .  Number of children: 3  . Years of education: Not on file  . Highest education level: Not on file  Occupational History  . Occupation: DISABLED  Social Needs  . Financial resource strain: Not on file  . Food insecurity:    Worry: Not on file    Inability: Not on file  . Transportation needs:    Medical: Not on file    Non-medical: Not on file  Tobacco Use  . Smoking status: Current Every Day Smoker    Packs/day: 0.50    Years: 30.00    Pack years: 15.00    Types: Cigarettes  . Smokeless  tobacco: Never Used  . Tobacco comment: Took information today to call for help with support to quit   Substance and Sexual Activity  . Alcohol use: Yes    Alcohol/week: 0.0 oz    Comment: Reports none in over a month, trying to quit   . Drug use: No  . Sexual activity: Not Currently  Lifestyle  . Physical activity:    Days per week: Not on file    Minutes per session: Not on file  . Stress: Not on file  Relationships  . Social connections:    Talks on phone: Not on file    Gets together: Not on file    Attends religious service: Not on file    Active member of club or organization: Not on file    Attends meetings of clubs or organizations: Not on file    Relationship status: Not on file  . Intimate partner violence:    Fear of current or ex partner: Not on file    Emotionally abused: Not on file    Physically abused: Not on file    Forced sexual activity: Not on file  Other Topics Concern  . Not on file  Social History Narrative   He smokes about a pack per day and he has been smoking since he was 52 years of age.  He drinks alcohol occasionally, but he denies any illicit drug abuse.  He is presently on disability.    Lives with wife in a 2 story home.  Has 2 children.   Previously worked in [employer], last worked in 1998.   Highest level of education:  11th grade            Physical Exam  Constitutional: He is oriented to person, place, and time.  Cardiovascular: Normal rate and regular rhythm.  Pulmonary/Chest: Effort normal and breath sounds normal.  Musculoskeletal: Normal range of motion. He exhibits edema.  Neurological: He is alert and oriented to person, place, and time.  Skin: Skin is warm and dry.  Psychiatric: He has a normal mood and affect.        Future Appointments  Date Time Provider [place]  05/11/2018 10:30 AM Daron Offer, [name], MD [facility] None  06/07/2018 11:00 AM [facility] ECHO 1-BU[facility]  06/07/2018 12:00 PM [name], MD  [facility] None    BP 106/78 (BP Location: Left Arm, Patient Position: Sitting, Cuff Size: Large)   Pulse 75   Resp 16   Wt (!) 435 lb (197.3 kg)   SpO2 98%   BMI 62.42 kg/m   Weight yesterday- 435 lb Last visit weight- 428 lb  Mr Laskaris was seen at home today and reported feeling SOB and tired. He said he woke up this morning and felt like he was not able to get a full breath. He reported 10 lb  weight gain since last week and said he is only taking his torsemide if he thinks he needs to. He said prior to this morning, the last time he took a torsemide was Monday and only took 20 mg at that time. He said he tool it today when he woke up because of his SOB but again only took 20 mg. He said he is also having intermittent episodes where his "heart flutters" and when this happens he takes a potassium tablet. He also reported that he has only been taking Losartan once daily instead of twice daily as prescribed. I stressed the importance of taking his medications as prescribed and broke down how each of his medications work to explain why they are so important. He said that he feels "weak on the medicine" and wasn't able to lift his speakers. I then explained to him that he is likely weak because his heart is so sick and that his heart cannot take much more stress. I explained the seriousness of his disease to the best of my ability and stressed to him that an early death is the likely outcome if he does not start taking the medications. He asked me to speak with his wife about this so I gave her the same information. Both said the understood. I pleaded with him to take his medications every day so we could hope for an improved EF in July when he has his next echocardiogram.   Time spent with patient: 76 minutes  Jacquiline Doe, EMT 04/02/18  ACTION: Home visit completed Next visit planned for 1 week

## 2018-04-08 ENCOUNTER — Telehealth (HOSPITAL_COMMUNITY): Payer: Self-pay

## 2018-04-08 NOTE — Telephone Encounter (Signed)
I called [name] to set up an appointment for today or tomorrow. He stated he was about to leave his house for a funeral but would be available tomorrow. We agreed to meet at 11:00 tomorrow at his house.

## 2018-04-09 ENCOUNTER — Telehealth (HOSPITAL_COMMUNITY): Payer: Self-pay

## 2018-04-09 NOTE — Telephone Encounter (Signed)
I called [name] while standing on his front step because he was not answering the door after I knocked and rang the doorbell. We had a scheduled appointment for this morning at 11:00, however it is not unlike him to miss our appointments. He did not answer the phone so I left a voicemail requesting he call me and let me know what happened and when he could make up the appointment.

## 2018-04-15 ENCOUNTER — Telehealth (HOSPITAL_COMMUNITY): Payer: Self-pay

## 2018-04-15 NOTE — Telephone Encounter (Signed)
I called [name] to schedule an appointment. He did not answer the phone so I left a voicemail expressing concern for his wellbeing considering our missed appointment and that he has not returned my phone call. I requested he call me back so the we can reschedule a visit ASAP.

## 2018-04-21 ENCOUNTER — Encounter (HOSPITAL_COMMUNITY): Payer: Self-pay

## 2018-04-21 ENCOUNTER — Other Ambulatory Visit: Payer: Self-pay

## 2018-04-21 ENCOUNTER — Ambulatory Visit (INDEPENDENT_AMBULATORY_CARE_PROVIDER_SITE_OTHER): Payer: Medicare HMO | Admitting: Family

## 2018-04-21 ENCOUNTER — Ambulatory Visit: Payer: Self-pay | Admitting: *Deleted

## 2018-04-21 ENCOUNTER — Encounter: Payer: Self-pay | Admitting: Family

## 2018-04-21 ENCOUNTER — Inpatient Hospital Stay (HOSPITAL_COMMUNITY)
Admission: EM | Admit: 2018-04-21 | Discharge: 2018-04-24 | DRG: 445 | Disposition: A | Discharge status: Home / Self Care | Payer: Medicare HMO | Attending: Internal Medicine | Admitting: Internal Medicine

## 2018-04-21 ENCOUNTER — Other Ambulatory Visit (INDEPENDENT_AMBULATORY_CARE_PROVIDER_SITE_OTHER): Payer: Medicare HMO

## 2018-04-21 VITALS — BP 120/88 | HR 66 | Temp 97.7°F | Ht 70.0 in | Wt >= 6400 oz

## 2018-04-21 DIAGNOSIS — I5042 Chronic combined systolic (congestive) and diastolic (congestive) heart failure: Secondary | ICD-10-CM | POA: Diagnosis not present

## 2018-04-21 DIAGNOSIS — Z9114 Patient's other noncompliance with medication regimen: Secondary | ICD-10-CM

## 2018-04-21 DIAGNOSIS — R1110 Vomiting, unspecified: Secondary | ICD-10-CM | POA: Diagnosis present

## 2018-04-21 DIAGNOSIS — R935 Abnormal findings on diagnostic imaging of other abdominal regions, including retroperitoneum: Secondary | ICD-10-CM | POA: Diagnosis not present

## 2018-04-21 DIAGNOSIS — E1122 Type 2 diabetes mellitus with diabetic chronic kidney disease: Secondary | ICD-10-CM | POA: Diagnosis present

## 2018-04-21 DIAGNOSIS — E790 Hyperuricemia without signs of inflammatory arthritis and tophaceous disease: Secondary | ICD-10-CM

## 2018-04-21 DIAGNOSIS — E118 Type 2 diabetes mellitus with unspecified complications: Secondary | ICD-10-CM | POA: Diagnosis not present

## 2018-04-21 DIAGNOSIS — Z7982 Long term (current) use of aspirin: Secondary | ICD-10-CM

## 2018-04-21 DIAGNOSIS — R945 Abnormal results of liver function studies: Secondary | ICD-10-CM

## 2018-04-21 DIAGNOSIS — N179 Acute kidney failure, unspecified: Secondary | ICD-10-CM | POA: Diagnosis present

## 2018-04-21 DIAGNOSIS — R17 Unspecified jaundice: Secondary | ICD-10-CM | POA: Diagnosis not present

## 2018-04-21 DIAGNOSIS — Z833 Family history of diabetes mellitus: Secondary | ICD-10-CM

## 2018-04-21 DIAGNOSIS — E876 Hypokalemia: Secondary | ICD-10-CM | POA: Diagnosis present

## 2018-04-21 DIAGNOSIS — I4891 Unspecified atrial fibrillation: Secondary | ICD-10-CM | POA: Diagnosis present

## 2018-04-21 DIAGNOSIS — K8020 Calculus of gallbladder without cholecystitis without obstruction: Secondary | ICD-10-CM | POA: Diagnosis present

## 2018-04-21 DIAGNOSIS — I10 Essential (primary) hypertension: Secondary | ICD-10-CM | POA: Diagnosis not present

## 2018-04-21 DIAGNOSIS — R8290 Unspecified abnormal findings in urine: Secondary | ICD-10-CM | POA: Diagnosis not present

## 2018-04-21 DIAGNOSIS — K8051 Calculus of bile duct without cholangitis or cholecystitis with obstruction: Secondary | ICD-10-CM | POA: Diagnosis not present

## 2018-04-21 DIAGNOSIS — I429 Cardiomyopathy, unspecified: Secondary | ICD-10-CM | POA: Diagnosis present

## 2018-04-21 DIAGNOSIS — R5381 Other malaise: Secondary | ICD-10-CM | POA: Diagnosis present

## 2018-04-21 DIAGNOSIS — N182 Chronic kidney disease, stage 2 (mild): Secondary | ICD-10-CM | POA: Diagnosis not present

## 2018-04-21 DIAGNOSIS — Z8249 Family history of ischemic heart disease and other diseases of the circulatory system: Secondary | ICD-10-CM

## 2018-04-21 DIAGNOSIS — R69 Illness, unspecified: Secondary | ICD-10-CM | POA: Diagnosis not present

## 2018-04-21 DIAGNOSIS — R740 Nonspecific elevation of levels of transaminase and lactic acid dehydrogenase [LDH]: Secondary | ICD-10-CM | POA: Diagnosis not present

## 2018-04-21 DIAGNOSIS — L299 Pruritus, unspecified: Secondary | ICD-10-CM

## 2018-04-21 DIAGNOSIS — Z6841 Body Mass Index (BMI) 40.0 and over, adult: Secondary | ICD-10-CM

## 2018-04-21 DIAGNOSIS — D3502 Benign neoplasm of left adrenal gland: Secondary | ICD-10-CM | POA: Diagnosis present

## 2018-04-21 DIAGNOSIS — Z86711 Personal history of pulmonary embolism: Secondary | ICD-10-CM

## 2018-04-21 DIAGNOSIS — Z79899 Other long term (current) drug therapy: Secondary | ICD-10-CM

## 2018-04-21 DIAGNOSIS — K8021 Calculus of gallbladder without cholecystitis with obstruction: Secondary | ICD-10-CM

## 2018-04-21 DIAGNOSIS — F411 Generalized anxiety disorder: Secondary | ICD-10-CM | POA: Diagnosis present

## 2018-04-21 DIAGNOSIS — I2699 Other pulmonary embolism without acute cor pulmonale: Secondary | ICD-10-CM | POA: Diagnosis present

## 2018-04-21 DIAGNOSIS — K828 Other specified diseases of gallbladder: Secondary | ICD-10-CM | POA: Diagnosis present

## 2018-04-21 DIAGNOSIS — F1010 Alcohol abuse, uncomplicated: Secondary | ICD-10-CM | POA: Diagnosis present

## 2018-04-21 DIAGNOSIS — G4733 Obstructive sleep apnea (adult) (pediatric): Secondary | ICD-10-CM | POA: Diagnosis present

## 2018-04-21 DIAGNOSIS — F17210 Nicotine dependence, cigarettes, uncomplicated: Secondary | ICD-10-CM | POA: Diagnosis present

## 2018-04-21 DIAGNOSIS — K8081 Other cholelithiasis with obstruction: Secondary | ICD-10-CM | POA: Diagnosis not present

## 2018-04-21 DIAGNOSIS — R7989 Other specified abnormal findings of blood chemistry: Secondary | ICD-10-CM

## 2018-04-21 DIAGNOSIS — I130 Hypertensive heart and chronic kidney disease with heart failure and stage 1 through stage 4 chronic kidney disease, or unspecified chronic kidney disease: Secondary | ICD-10-CM | POA: Diagnosis present

## 2018-04-21 DIAGNOSIS — Z888 Allergy status to other drugs, medicaments and biological substances status: Secondary | ICD-10-CM

## 2018-04-21 LAB — COMPREHENSIVE METABOLIC PANEL
ALT: 178 U/L — ABNORMAL HIGH (ref 0–53)
AST: 84 U/L — ABNORMAL HIGH (ref 0–37)
Albumin: 4.1 g/dL (ref 3.5–5.2)
Alkaline Phosphatase: 356 U/L — ABNORMAL HIGH (ref 39–117)
BUN: 15 mg/dL (ref 6–23)
CALCIUM: 9.6 mg/dL (ref 8.4–10.5)
CO2: 24 mEq/L (ref 19–32)
Chloride: 102 mEq/L (ref 96–112)
Creatinine, Ser: 1.06 mg/dL (ref 0.40–1.50)
GFR: 95.27 mL/min (ref >60.00)
GLUCOSE: 230 mg/dL — AB (ref 70–99)
POTASSIUM: 3.6 meq/L (ref 3.5–5.1)
Sodium: 138 mEq/L (ref 135–145)
TOTAL PROTEIN: 8.1 g/dL (ref 6.0–8.3)
Total Bilirubin: 4.6 mg/dL — ABNORMAL HIGH (ref 0.2–1.2)

## 2018-04-21 LAB — CBC WITH DIFFERENTIAL/PLATELET
Basophils Absolute: 0.1 10*3/uL (ref 0.0–0.1)
Basophils Relative: 1.1 % (ref 0.0–3.0)
EOS PCT: 4.2 % (ref 0.0–5.0)
Eosinophils Absolute: 0.3 10*3/uL (ref 0.0–0.7)
HEMATOCRIT: 43.8 % (ref 39.0–52.0)
HEMOGLOBIN: 15.4 g/dL (ref 13.0–17.0)
LYMPHS PCT: 44.3 % (ref 12.0–46.0)
Lymphs Abs: 3.1 10*3/uL (ref 0.7–4.0)
MCHC: 35 g/dL (ref 30.0–36.0)
MCV: 85.7 fl (ref 78.0–100.0)
MONOS PCT: 11.1 % (ref 3.0–12.0)
Monocytes Absolute: 0.8 10*3/uL (ref 0.1–1.0)
Neutro Abs: 2.7 10*3/uL (ref 1.4–7.7)
Neutrophils Relative %: 39.3 % — ABNORMAL LOW (ref 43.0–77.0)
Platelets: 280 10*3/uL (ref 150.0–400.0)
RBC: 5.11 Mil/uL (ref 4.22–5.81)
RDW: 15.3 % (ref 11.5–15.5)
WBC: 6.9 10*3/uL (ref 4.0–10.5)

## 2018-04-21 LAB — URINALYSIS, ROUTINE W REFLEX MICROSCOPIC
BILIRUBIN URINE: NEGATIVE
Hgb urine dipstick: NEGATIVE
Ketones, ur: NEGATIVE
NITRITE: NEGATIVE
PH: 6 (ref 5.0–8.0)
RBC / HPF: NONE SEEN (ref 0–?)
Specific Gravity, Urine: 1.01 (ref 1.000–1.030)
Total Protein, Urine: NEGATIVE
UROBILINOGEN UA: 1 (ref 0.0–1.0)
Urine Glucose: NEGATIVE

## 2018-04-21 LAB — URIC ACID: URIC ACID, SERUM: 8.9 mg/dL — AB (ref 4.0–7.8)

## 2018-04-21 LAB — VITAMIN B12: VITAMIN B 12: 1198 pg/mL — AB (ref 211–911)

## 2018-04-21 LAB — PSA: PSA: 0.38 ng/mL (ref 0.10–4.00)

## 2018-04-21 MED ORDER — AMMONIUM LACTATE 12 % EX CREA: TOPICAL_CREAM | CUTANEOUS | 0 refills | Status: DC | PRN

## 2018-04-21 MED ORDER — HYDROXYZINE HCL 25 MG PO TABS: 25.0000 mg | ORAL_TABLET | Freq: Three times a day (TID) | ORAL | 0 refills | Status: DC | PRN

## 2018-04-21 NOTE — Telephone Encounter (Addendum)
Pt called with complaints of itching all over for a week; thought it was since his laundry detergent changed; he states that he started metformin but stopped it on Friday 5/ and he has been feeling sick and his blood sugar has been in the 110s; the pt says it a deep itch; on Sunday his blood sugar was 128; he also states that he has lost 30 pounds; he says that he had 1 episode of emesis yesterday; the pt also reports that his urine is a dark yellow and it clears up when he takes his demedex;; also his BP 108/64 on 04/20/18 before taking his medication; recommendations made per nurse triage protocol to include seeing a physician within 24 hours;  pt normally sees Dr Scarlette Calico but he has no availability;  discussed with pt the need for 2 separate appointments (1 for itching and 1 for medications and other issues); pt normally  sees pt offered and accepted an appointment today with Jodi Mourning, [facility] to discuss his itching, at 1120 today; he verbalizes understanding; pt also offered and accepted appointment with Dr Scarlette Calico, per [place] at [facility], on Thursday 04/29/18 at 1030; he also verbalizes understanding; will route to office for notification of this upcoming appointment.   Reason for Disposition . [1] MODERATE-SEVERE widespread itching (i.e., interferes with sleep, normal activities or school) AND [2] not improved after 24 hours of itching Care Advice  Answer Assessment - Initial Assessment Questions 1. DESCRIPTION: "Describe the itching you are having."     "deep itch" 2. SEVERITY: "How bad is it?"    - MILD - doesn't interfere with normal activities   - MODERATE-SEVERE: interferes with work, school, sleep, or other activities      severe 3. SCRATCHING: "Are there any scratch marks? Bleeding?"     no 4. ONSET: "When did this begin?"      04/14/18 5. CAUSE: "What do you think is causing the itching?" (ask about swimming pools, pollen, animals, soaps, etc.)     unsure 6. OTHER SYMPTOMS: "Do  you have any other symptoms?"      Family history of excema 7. PREGNANCY: "Is there any chance you are pregnant?" "When was your last menstrual period?"     no  Protocols used: ITCHING [facility]

## 2018-04-21 NOTE — Patient Instructions (Signed)
You can try using Aveeno Oatmeal Baths;

## 2018-04-21 NOTE — Progress Notes (Signed)
Reviewed with patient; due to extreme itching, questionable jaundice and labs, recommend ER evaluation for imaging/ fluids and possible admission.

## 2018-04-21 NOTE — Progress Notes (Signed)
[name] is a 52 y.o. male with the following history as recorded in EpicCare:  Patient Active Problem List   Diagnosis Date Noted  . Routine general medical examination at a health care facility 02/03/2018  . Idiopathic gout 02/02/2018  . Cough 02/02/2018  . RTI (respiratory tract infection) 02/02/2018  . Pyuria 02/02/2018  . PE (pulmonary thromboembolism) ([place]) 04/12/2017  . Hereditary and idiopathic peripheral neuropathy 07/16/2016  . Paroxysmal atrial fibrillation (HCC)   . Chronic anticoagulation, secondary to PE 03/2014 12/27/2014  . Osteoarthritis of both knees 12/27/2014  . Chronic systolic CHF (congestive heart failure) ([place])   . NSVT (nonsustained ventricular tachycardia) ([place]) 09/26/2013  . Alcohol abuse 08/10/2013  . Hyperlipidemia with target LDL less than 100 06/29/2013  . Nonischemic cardiomyopathy ([place]) 01/26/2013  . Type II diabetes mellitus with manifestations ([place]) 04/13/2012  . Tobacco user 02/18/2012  . Migraine 08/10/2011  . ED (erectile dysfunction) 07/11/2011  . GAD (generalized anxiety disorder) 06/12/2010  . Morbid obesity ([place]) 06/11/2010  . Essential hypertension 06/11/2010  . Systolic CHF, chronic ([place]) 06/11/2010  . Esophageal reflux 06/11/2010  . OSA (obstructive sleep apnea) with BiPap and oxygen 06/11/2010    Current Outpatient Medications  Medication Sig Dispense Refill  . allopurinol (ZYLOPRIM) 100 MG tablet Take 1 tablet (100 mg total) by mouth daily. 90 tablet 1  . aspirin EC 81 MG tablet Take 1 tablet (81 mg total) by mouth daily. 90 tablet 3  . clonazePAM (KLONOPIN) 1 MG tablet TAKE 1 TABLET BY MOUTH 3 TIMES DAILY AS NEEDED FOR ANXIETY 90 tablet 2  . losartan (COZAAR) 50 MG tablet Take 1 tablet (50 mg total) by mouth 2 (two) times daily. 60 tablet 3  . magnesium oxide (MAG-OX) 400 MG tablet Take 400 mg by mouth daily as needed (cramping).    . metFORMIN (GLUCOPHAGE-XR) 750 MG 24 hr tablet Take 750 mg by mouth daily with breakfast.     . metoprolol succinate (TOPROL-XL) 100 MG 24 hr tablet Take 100 mg by mouth daily. Take with or immediately following a meal.    . NON FORMULARY Bipap, pressure 16/12 with 2L of O2    . pantoprazole (PROTONIX) 40 MG tablet TAKE 1 TABLET (40 MG TOTAL) BY MOUTH DAILY. 90 tablet 3  . potassium chloride SA (K-DUR,KLOR-CON) 20 MEQ tablet Take 20 mEq by mouth daily as needed (cramping).    . sertraline (ZOLOFT) 100 MG tablet Take 1 tablet (100 mg total) by mouth daily. 90 tablet 0  . sildenafil (REVATIO) 20 MG tablet Take 1 tablet (20 mg total) by mouth daily as needed. 15 tablet 11  . spironolactone (ALDACTONE) 25 MG tablet Take 1 tablet (25 mg total) by mouth daily. 30 tablet 6  . torsemide (DEMADEX) 20 MG tablet Take 2 tablets (40 mg total) by mouth daily. 60 tablet 11  . ammonium lactate (LAC-HYDRIN) 12 % cream Apply topically as needed for dry skin. 385 g 0  . hydrOXYzine (ATARAX/VISTARIL) 25 MG tablet Take 1 tablet (25 mg total) by mouth 3 (three) times daily as needed for itching. 30 tablet 0   No current facility-administered medications for this visit.     Allergies: Ace inhibitors and Buspirone  Past Medical History:  Diagnosis Date  . Alcohol abuse   . Anxiety state, unspecified   . Atrial fibrillation ([place])   . CHF (congestive heart failure) ([place])   . Chronic systolic heart failure ([place])   . Diabetes mellitus, type II ([place])   .  Edema   . History of medication noncompliance   . Migraine   . Obesity, unspecified   . Obstructive sleep apnea   . Psychiatric disorder   . Pulmonary embolism ([place])   . Shortness of breath     Past Surgical History:  Procedure Laterality Date  . CARDIAC CATHETERIZATION    . CARDIAC CATHETERIZATION N/A 08/13/2015   Procedure: Right/Left Heart Cath and Coronary Angiography;  Surgeon: [name], MD;  Location: [facility];  Service: Cardiovascular;  Laterality: N/A;  . RIGHT/LEFT HEART CATH AND CORONARY ANGIOGRAPHY N/A 04/14/2017    Procedure: Right/Left Heart Cath and Coronary Angiography;  Surgeon: [name], MD;  Location: [facility];  Service: Cardiovascular;  Laterality: N/A;  . TESTICLE SURGERY      Family History  Problem Relation Age of Onset  . Cancer Mother        brain tumor  . Hypertension Mother   . Diabetes Father        Deceased, 53  . Heart disease Maternal Grandmother   . Hypertension Unknown        Family History  . Stroke Unknown        Family History  . Diabetes Unknown        Family History  . Diabetes Daughter     Social History   Tobacco Use  . Smoking status: Current Every Day Smoker    Packs/day: 0.50    Years: 30.00    Pack years: 15.00    Types: Cigarettes  . Smokeless tobacco: Never Used  . Tobacco comment: Took information today to call for help with support to quit   Substance Use Topics  . Alcohol use: Yes    Alcohol/week: 0.0 oz    Comment: Reports none in over a month, trying to quit     Subjective:  Patient presents with concerns for progressively worsening "itching" all over; notes symptoms have been present x 1.5 weeks; originally thought it was due to new laundry detergent; however, has changed back to old detergent and re-washed all his clothes with no change in symptoms; denies any new soaps or medications otherwise; has been on Zoloft x 1 month and notes his fluid pill was increased recently; he does have documented gout and is not taking the Allopurinol prescribed by his PCP; denies any abdominal pain or changes in his bowel movements; weight loss noted today is intentional- working on better eating habits; has noticed that his urine looks darker recently and has a "strong ammonia smell." Does have 79 yo son and wife in same home- neither of them have similar symptoms; patient does not have any type of rash on hands, in between fingers;   Objective:  Vitals:   04/21/18 1114  BP: 120/88  Pulse: 66  Temp: 97.7 F (36.5 C)  TempSrc: Oral  SpO2: 96%   Weight: (!) 411 lb (186.4 kg)  Height: 5' 10"  (1.778 m)    General: Well developed, well nourished, in no acute distress; patient is continually scratching during course of exam Skin : Warm and dry. No abnormalities noted between web spacing of hand Head: Normocephalic and atraumatic  Eyes: Sclera and conjunctiva appear cloudy/ yellowish; pupils round and reactive to light; extraocular movements intact  Ears: External normal; canals clear; tympanic membranes normal  Oropharynx: Pink, supple. No suspicious lesions  Neck: Supple without thyromegaly, adenopathy  Lungs: Respirations unlabored; clear to auscultation bilaterally without wheeze, rales, rhonchi  CVS exam: normal rate  and regular rhythm.  Abdomen: Soft; nontender; nondistended; normoactive bowel sounds; no masses or hepatosplenomegaly; unable to assess easily due to body habitus  Musculoskeletal: No deformities; no active joint inflammation  Extremities: No edema, cyanosis, clubbing  Vessels: Symmetric bilaterally  Neurologic: Alert and oriented; speech intact; face symmetrical; moves all extremities well; CNII-XII intact without focal deficit  Assessment:  1. Itching   2. Bad odor of urine   3. Elevated blood uric acid level     Plan:  Am concerned for liver issue as source of symptoms; will update labs and urine today; I did contact patient as soon as labs were received and recommended ER evaluation due to marked elevation in his LFTs and concerns for jaundice; He agrees and will go to [facility]; Follow-up to be determined; No follow-ups on file.  Orders Placed This Encounter  Procedures  . Urine Culture    Standing Status:   Future    Number of Occurrences:   1    Standing Expiration Date:   04/21/2019  . Comp Met (CMET)    Standing Status:   Future    Number of Occurrences:   1    Standing Expiration Date:   04/21/2019  . B12    Standing Status:   Future    Number of Occurrences:   1    Standing Expiration Date:    04/21/2019  . Vitamin B1    Standing Status:   Future    Number of Occurrences:   1    Standing Expiration Date:   04/22/2019  . CBC w/Diff    Standing Status:   Future    Number of Occurrences:   1    Standing Expiration Date:   04/21/2019  . Urinalysis    Standing Status:   Future    Number of Occurrences:   1    Standing Expiration Date:   04/21/2019  . Uric acid    Standing Status:   Future    Number of Occurrences:   1    Standing Expiration Date:   04/21/2019  . PSA    Standing Status:   Future    Number of Occurrences:   1    Standing Expiration Date:   04/21/2019    Requested Prescriptions   Signed Prescriptions Disp Refills  . hydrOXYzine (ATARAX/VISTARIL) 25 MG tablet 30 tablet 0    Sig: Take 1 tablet (25 mg total) by mouth 3 (three) times daily as needed for itching.  Marland Kitchen ammonium lactate (LAC-HYDRIN) 12 % cream 385 g 0    Sig: Apply topically as needed for dry skin.

## 2018-04-21 NOTE — ED Triage Notes (Addendum)
Pt sent by Dr for elevated liver enzymes and left sided abd pain. Pt had episode of vomiting yesterday. Pt originally went to dr for itching and headache x 2 weeks that the pt thought was an allergic reaction to medications he takes. VSS

## 2018-04-21 NOTE — Telephone Encounter (Signed)
FYI

## 2018-04-22 ENCOUNTER — Ambulatory Visit: Payer: Self-pay | Admitting: Family

## 2018-04-22 ENCOUNTER — Inpatient Hospital Stay (HOSPITAL_COMMUNITY): Payer: Medicare HMO

## 2018-04-22 ENCOUNTER — Emergency Department (HOSPITAL_COMMUNITY): Payer: Medicare HMO

## 2018-04-22 ENCOUNTER — Encounter (HOSPITAL_COMMUNITY): Payer: Self-pay | Admitting: Emergency Medicine

## 2018-04-22 DIAGNOSIS — L299 Pruritus, unspecified: Secondary | ICD-10-CM | POA: Diagnosis present

## 2018-04-22 DIAGNOSIS — I4891 Unspecified atrial fibrillation: Secondary | ICD-10-CM | POA: Diagnosis not present

## 2018-04-22 DIAGNOSIS — I5042 Chronic combined systolic (congestive) and diastolic (congestive) heart failure: Secondary | ICD-10-CM

## 2018-04-22 DIAGNOSIS — Z9114 Patient's other noncompliance with medication regimen: Secondary | ICD-10-CM | POA: Diagnosis not present

## 2018-04-22 DIAGNOSIS — R5381 Other malaise: Secondary | ICD-10-CM | POA: Diagnosis present

## 2018-04-22 DIAGNOSIS — Z7982 Long term (current) use of aspirin: Secondary | ICD-10-CM | POA: Diagnosis not present

## 2018-04-22 DIAGNOSIS — R1110 Vomiting, unspecified: Secondary | ICD-10-CM | POA: Diagnosis not present

## 2018-04-22 DIAGNOSIS — F411 Generalized anxiety disorder: Secondary | ICD-10-CM | POA: Diagnosis present

## 2018-04-22 DIAGNOSIS — I130 Hypertensive heart and chronic kidney disease with heart failure and stage 1 through stage 4 chronic kidney disease, or unspecified chronic kidney disease: Secondary | ICD-10-CM | POA: Diagnosis not present

## 2018-04-22 DIAGNOSIS — Z833 Family history of diabetes mellitus: Secondary | ICD-10-CM | POA: Diagnosis not present

## 2018-04-22 DIAGNOSIS — R17 Unspecified jaundice: Secondary | ICD-10-CM | POA: Diagnosis not present

## 2018-04-22 DIAGNOSIS — F1010 Alcohol abuse, uncomplicated: Secondary | ICD-10-CM | POA: Diagnosis present

## 2018-04-22 DIAGNOSIS — I10 Essential (primary) hypertension: Secondary | ICD-10-CM

## 2018-04-22 DIAGNOSIS — G4733 Obstructive sleep apnea (adult) (pediatric): Secondary | ICD-10-CM | POA: Diagnosis not present

## 2018-04-22 DIAGNOSIS — F17210 Nicotine dependence, cigarettes, uncomplicated: Secondary | ICD-10-CM | POA: Diagnosis present

## 2018-04-22 DIAGNOSIS — N182 Chronic kidney disease, stage 2 (mild): Secondary | ICD-10-CM | POA: Diagnosis not present

## 2018-04-22 DIAGNOSIS — R935 Abnormal findings on diagnostic imaging of other abdominal regions, including retroperitoneum: Secondary | ICD-10-CM | POA: Diagnosis not present

## 2018-04-22 DIAGNOSIS — K8051 Calculus of bile duct without cholangitis or cholecystitis with obstruction: Secondary | ICD-10-CM | POA: Diagnosis not present

## 2018-04-22 DIAGNOSIS — E118 Type 2 diabetes mellitus with unspecified complications: Secondary | ICD-10-CM | POA: Diagnosis not present

## 2018-04-22 DIAGNOSIS — K828 Other specified diseases of gallbladder: Secondary | ICD-10-CM | POA: Diagnosis present

## 2018-04-22 DIAGNOSIS — R69 Illness, unspecified: Secondary | ICD-10-CM | POA: Diagnosis not present

## 2018-04-22 DIAGNOSIS — K8020 Calculus of gallbladder without cholecystitis without obstruction: Secondary | ICD-10-CM | POA: Diagnosis not present

## 2018-04-22 DIAGNOSIS — Z86711 Personal history of pulmonary embolism: Secondary | ICD-10-CM | POA: Diagnosis not present

## 2018-04-22 DIAGNOSIS — R740 Nonspecific elevation of levels of transaminase and lactic acid dehydrogenase [LDH]: Secondary | ICD-10-CM | POA: Diagnosis not present

## 2018-04-22 DIAGNOSIS — I429 Cardiomyopathy, unspecified: Secondary | ICD-10-CM | POA: Diagnosis not present

## 2018-04-22 DIAGNOSIS — Z888 Allergy status to other drugs, medicaments and biological substances status: Secondary | ICD-10-CM | POA: Diagnosis not present

## 2018-04-22 DIAGNOSIS — R932 Abnormal findings on diagnostic imaging of liver and biliary tract: Secondary | ICD-10-CM | POA: Diagnosis not present

## 2018-04-22 DIAGNOSIS — Z8249 Family history of ischemic heart disease and other diseases of the circulatory system: Secondary | ICD-10-CM | POA: Diagnosis not present

## 2018-04-22 DIAGNOSIS — N179 Acute kidney failure, unspecified: Secondary | ICD-10-CM | POA: Diagnosis not present

## 2018-04-22 DIAGNOSIS — Z6841 Body Mass Index (BMI) 40.0 and over, adult: Secondary | ICD-10-CM | POA: Diagnosis not present

## 2018-04-22 DIAGNOSIS — R1084 Generalized abdominal pain: Secondary | ICD-10-CM | POA: Diagnosis not present

## 2018-04-22 DIAGNOSIS — E1122 Type 2 diabetes mellitus with diabetic chronic kidney disease: Secondary | ICD-10-CM | POA: Diagnosis present

## 2018-04-22 LAB — URINALYSIS, ROUTINE W REFLEX MICROSCOPIC
BACTERIA UA: NONE SEEN
Glucose, UA: NEGATIVE mg/dL
Hgb urine dipstick: NEGATIVE
KETONES UR: NEGATIVE mg/dL
Nitrite: NEGATIVE
PH: 5 (ref 5.0–8.0)
PROTEIN: NEGATIVE mg/dL
Specific Gravity, Urine: 1.015 (ref 1.005–1.030)

## 2018-04-22 LAB — PROTIME-INR
INR: 1
Prothrombin Time: 13.1 seconds (ref 11.4–15.2)

## 2018-04-22 LAB — CBG MONITORING, ED
Glucose-Capillary: 190 mg/dL — ABNORMAL HIGH (ref 65–99)
Glucose-Capillary: 190 mg/dL — ABNORMAL HIGH (ref 65–99)
Glucose-Capillary: 201 mg/dL — ABNORMAL HIGH (ref 65–99)
Glucose-Capillary: 167 mg/dL — ABNORMAL HIGH (ref 65–99)

## 2018-04-22 LAB — BILIRUBIN, FRACTIONATED(TOT/DIR/INDIR)
BILIRUBIN TOTAL: 4.9 mg/dL — AB (ref 0.3–1.2)
Bilirubin, Direct: 2.5 mg/dL — ABNORMAL HIGH (ref 0.1–0.5)
Indirect Bilirubin: 2.4 mg/dL — ABNORMAL HIGH (ref 0.3–0.9)

## 2018-04-22 LAB — COMPREHENSIVE METABOLIC PANEL
ALT: 182 U/L — AB (ref 17–63)
AST: 96 U/L — AB (ref 15–41)
Albumin: 3.8 g/dL (ref 3.5–5.0)
Alkaline Phosphatase: 335 U/L — ABNORMAL HIGH (ref 38–126)
Anion gap: 11 (ref 5–15)
BUN: 15 mg/dL (ref 6–20)
CHLORIDE: 102 mmol/L (ref 101–111)
CO2: 27 mmol/L (ref 22–32)
CREATININE: 1.32 mg/dL — AB (ref 0.61–1.24)
Calcium: 9.4 mg/dL (ref 8.9–10.3)
GFR calc Af Amer: >60 mL/min (ref >60)
GFR calc non Af Amer: >60 mL/min (ref >60)
Glucose, Bld: 196 mg/dL — ABNORMAL HIGH (ref 65–99)
Potassium: 3.5 mmol/L (ref 3.5–5.1)
SODIUM: 140 mmol/L (ref 135–145)
Total Bilirubin: 5 mg/dL — ABNORMAL HIGH (ref 0.3–1.2)
Total Protein: 8.2 g/dL — ABNORMAL HIGH (ref 6.5–8.1)

## 2018-04-22 LAB — CBC
HCT: 42.7 % (ref 39.0–52.0)
Hemoglobin: 15 g/dL (ref 13.0–17.0)
MCH: 28.7 pg (ref 26.0–34.0)
MCHC: 35.1 g/dL (ref 30.0–36.0)
MCV: 81.6 fL (ref 78.0–100.0)
Platelets: 285 10*3/uL (ref 150–400)
RBC: 5.23 MIL/uL (ref 4.22–5.81)
RDW: 14.6 % (ref 11.5–15.5)
WBC: 9.6 10*3/uL (ref 4.0–10.5)

## 2018-04-22 LAB — GLUCOSE, CAPILLARY
GLUCOSE-CAPILLARY: 156 mg/dL — AB (ref 65–99)
Glucose-Capillary: 200 mg/dL — ABNORMAL HIGH (ref 65–99)
Glucose-Capillary: 158 mg/dL — ABNORMAL HIGH (ref 65–99)

## 2018-04-22 LAB — FERRITIN: FERRITIN: 448 ng/mL — AB (ref 24–336)

## 2018-04-22 LAB — LIPASE, BLOOD: Lipase: 102 U/L — ABNORMAL HIGH (ref 11–51)

## 2018-04-22 MED ORDER — SODIUM CHLORIDE 0.9% FLUSH
3.0000 mL | Freq: Two times a day (BID) | INTRAVENOUS | Status: DC
  Administered 2018-04-22 – 2018-04-23 (×4): 3 mL via INTRAVENOUS

## 2018-04-22 MED ORDER — NICOTINE 21 MG/24HR TD PT24
21.0000 mg | MEDICATED_PATCH | Freq: Every day | TRANSDERMAL | Status: DC
  Administered 2018-04-22 – 2018-04-24 (×3): 21 mg via TRANSDERMAL
  Filled 2018-04-22 (×3): qty 1

## 2018-04-22 MED ORDER — CLONAZEPAM 0.5 MG PO TABS
0.5000 mg | ORAL_TABLET | Freq: Two times a day (BID) | ORAL | Status: DC
  Administered 2018-04-22 – 2018-04-24 (×5): 0.5 mg via ORAL
  Filled 2018-04-22 (×5): qty 1

## 2018-04-22 MED ORDER — SODIUM CHLORIDE 0.9 % IV SOLN
250.0000 mL | INTRAVENOUS | Status: DC | PRN
Start: 2018-04-22 — End: 2018-04-24

## 2018-04-22 MED ORDER — LOPERAMIDE HCL 2 MG PO CAPS: 2.0000 mg | ORAL_CAPSULE | ORAL | Status: DC | PRN

## 2018-04-22 MED ORDER — SODIUM CHLORIDE 0.9% FLUSH: 3.0000 mL | INTRAVENOUS | Status: DC | PRN

## 2018-04-22 MED ORDER — ENOXAPARIN SODIUM 100 MG/ML ~~LOC~~ SOLN
90.0000 mg | Freq: Every day | SUBCUTANEOUS | Status: DC
  Administered 2018-04-22 – 2018-04-24 (×3): 90 mg via SUBCUTANEOUS
  Filled 2018-04-22 (×3): qty 0.9

## 2018-04-22 MED ORDER — ONDANSETRON HCL 4 MG PO TABS: 4.0000 mg | ORAL_TABLET | Freq: Four times a day (QID) | ORAL | Status: DC | PRN

## 2018-04-22 MED ORDER — METOPROLOL SUCCINATE ER 100 MG PO TB24
100.0000 mg | ORAL_TABLET | Freq: Every day | ORAL | Status: DC
  Administered 2018-04-23 – 2018-04-24 (×2): 100 mg via ORAL
  Filled 2018-04-22: qty 1
  Filled 2018-04-22: qty 4
  Filled 2018-04-22 (×2): qty 1

## 2018-04-22 MED ORDER — GADOBENATE DIMEGLUMINE 529 MG/ML IV SOLN
20.0000 mL | Freq: Once | INTRAVENOUS | Status: AC
  Administered 2018-04-22: 20 mL via INTRAVENOUS

## 2018-04-22 MED ORDER — FENTANYL CITRATE (PF) 100 MCG/2ML IJ SOLN: 25.0000 ug | INTRAMUSCULAR | Status: DC | PRN

## 2018-04-22 MED ORDER — INSULIN ASPART 100 UNIT/ML ~~LOC~~ SOLN
0.0000 [IU] | SUBCUTANEOUS | Status: DC
  Administered 2018-04-22 – 2018-04-23 (×5): 2 [IU] via SUBCUTANEOUS
  Administered 2018-04-23: 3 [IU] via SUBCUTANEOUS
  Administered 2018-04-24 (×3): 2 [IU] via SUBCUTANEOUS
  Filled 2018-04-22 (×3): qty 1

## 2018-04-22 MED ORDER — HYDROXYZINE HCL 25 MG PO TABS
25.0000 mg | ORAL_TABLET | Freq: Four times a day (QID) | ORAL | Status: DC | PRN
  Administered 2018-04-23 (×2): 25 mg via ORAL
  Filled 2018-04-22 (×2): qty 1

## 2018-04-22 MED ORDER — ONDANSETRON HCL 4 MG/2ML IJ SOLN: 4.0000 mg | Freq: Four times a day (QID) | INTRAMUSCULAR | Status: DC | PRN

## 2018-04-22 NOTE — ED Notes (Signed)
MD at [facility]

## 2018-04-22 NOTE — Consult Note (Addendum)
Cardiology Consultation:   Patient ID: DREAM NODAL; [id number]; 1966-09-13   Admit date: 04/21/2018 Date of Consult: 04/22/2018  Primary Care Provider: [name], MD Primary Cardiologist: [name], MD  Primary Electrophysiologist:  [name]   Patient Profile:   [name] is a 52 y.o. male with a hx of NICM with EF 15% in 03/2017, felt to be related to prior ETOH abuse, PE in 2015 (complete a year of Xarelto), morbid obesity, OSA and tobacco abuse who is being seen today for preop evaluation at the request of Dr. [name] Labs .  History of Present Illness:   [name] has chronic systolic heart failure with EF 25-30% in 01/2017 and 15% in 03/2017. A L&R heart cath done in 03/2017 showed no significant CAD, mildly elevated right heart filling pressure, PWCP normal, low but not markedly low cardiac output.   With his admission in 03/2017 he had SOB and chest pain. Troponin was elevated but LHC showed no CAD. He was discharged on Torsemide. He is not on Entresto due to angioedema with lisinopril.  He was last seen in the heart failure clinic on 03/25/18 by [name], NP. He was doing well but only taking his meds intermittently. Today he says that he has now been taking his diuretic regularly. He has also been eating  Mostly salads and water. He says he has lost about 30 pounds. At his visit on 4/25 his wt was 428 lbs. Today he is 409.5 lbs. He denies any chest pain, dyspnea, orthopnea, edema or PND. He has been trying to be more active. He push mowed his front yard and washed his car this week. He was fatigued from this but not short of breath.   He has been considered for ICD in the past by Dr. Rayann Heman, but pt refused and he was felt not to be a good candidate due to his morbid obesity.   The patient has been found to have elevated liver enzymes and gallstone with itching. He is being considered for possible gallbladder surgery. The patient states that he really does not want surgery.     Past Medical History:  Diagnosis Date  . Alcohol abuse   . Anxiety state, unspecified   . Atrial fibrillation ([name])   . CHF (congestive heart failure) ([place])   . Chronic systolic heart failure ([place])   . Diabetes mellitus, type II ([name])   . Edema   . History of medication noncompliance   . Migraine   . Obesity, unspecified   . Obstructive sleep apnea   . Psychiatric disorder   . Pulmonary embolism ([name])   . Shortness of breath     Past Surgical History:  Procedure Laterality Date  . CARDIAC CATHETERIZATION    . CARDIAC CATHETERIZATION N/A 08/13/2015   Procedure: Right/Left Heart Cath and Coronary Angiography;  Surgeon: [name], MD;  Location: [facility];  Service: Cardiovascular;  Laterality: N/A;  . RIGHT/LEFT HEART CATH AND CORONARY ANGIOGRAPHY N/A 04/14/2017   Procedure: Right/Left Heart Cath and Coronary Angiography;  Surgeon: [name], MD;  Location: [facility];  Service: Cardiovascular;  Laterality: N/A;  . TESTICLE SURGERY       Home Medications:  Prior to Admission medications   Medication Sig Start Date End Date Taking? Authorizing Provider  allopurinol (ZYLOPRIM) 100 MG tablet Take 1 tablet (100 mg total) by mouth daily. Patient taking differently: Take 100 mg by mouth daily as needed (gout).  02/03/18  Yes [name], MD  ammonium lactate (LAC-HYDRIN) 12 % cream Apply topically as needed for dry skin. Patient taking differently: Apply 1 g topically daily as needed for dry skin.  04/21/18  Yes [name], FNP  aspirin EC 81 MG tablet Take 1 tablet (81 mg total) by mouth daily. 08/01/15  Yes [name], MD  clonazePAM (KLONOPIN) 1 MG tablet TAKE 1 TABLET BY MOUTH 3 TIMES DAILY AS NEEDED FOR ANXIETY Patient taking differently: Take 1 mg by mouth 2 (two) times daily. TAKE 1 TABLET BY MOUTH 2 TIMES DAILY CAN TAKE ANOTHER TABLET AS NEEDED FOR ANXIETY 03/09/18  Yes [name], [name], MD  hydrOXYzine (ATARAX/VISTARIL) 25  MG tablet Take 1 tablet (25 mg total) by mouth 3 (three) times daily as needed for itching. 04/21/18  Yes [name], FNP  losartan (COZAAR) 50 MG tablet Take 1 tablet (50 mg total) by mouth 2 (two) times daily. Patient taking differently: Take 50 mg by mouth daily.  02/25/18  Yes Clegg, Amy D, NP  magnesium oxide (MAG-OX) 400 MG tablet Take 400 mg by mouth daily as needed (cramping).   Yes [provider]  metoprolol succinate (TOPROL-XL) 100 MG 24 hr tablet Take 100 mg by mouth daily. Take with or immediately following a meal.   Yes [provider]  NON FORMULARY Bipap, pressure 16/12 with 2L of O2   Yes [provider]  pantoprazole (PROTONIX) 40 MG tablet TAKE 1 TABLET (40 MG TOTAL) BY MOUTH DAILY. 12/30/17  Yes Clegg, Amy D, NP  potassium chloride SA (K-DUR,KLOR-CON) 20 MEQ tablet Take 20 mEq by mouth daily as needed (cramping).   Yes [provider]  sertraline (ZOLOFT) 100 MG tablet Take 1 tablet (100 mg total) by mouth daily. 04/01/18  Yes [name], [name], MD  sildenafil (REVATIO) 20 MG tablet Take 1 tablet (20 mg total) by mouth daily as needed. Patient taking differently: Take 20 mg by mouth daily as needed (ED).  12/30/17  Yes Clegg, Amy D, NP  spironolactone (ALDACTONE) 25 MG tablet Take 1 tablet (25 mg total) by mouth daily. 03/11/18  Yes [name], MD  torsemide (DEMADEX) 20 MG tablet Take 2 tablets (40 mg total) by mouth daily. 03/04/18  Yes [name], MD  pravastatin (PRAVACHOL) 40 MG tablet Take 40 mg by mouth daily.  02/20/12  [provider]    Inpatient Medications: Scheduled Meds: . clonazePAM  0.5 mg Oral BID  . enoxaparin (LOVENOX) injection  90 mg Subcutaneous Daily  . insulin aspart  0-9 Units Subcutaneous Q4H  . metoprolol succinate  100 mg Oral Daily  . nicotine  21 mg Transdermal Daily  . sodium chloride flush  3 mL Intravenous Q12H   Continuous Infusions: . sodium chloride     PRN Meds: sodium  chloride, fentaNYL (SUBLIMAZE) injection, hydrOXYzine, ondansetron **OR** ondansetron (ZOFRAN) IV, sodium chloride flush  Allergies:    Allergies  Allergen Reactions  . Ace Inhibitors Anaphylaxis and Swelling  . Buspirone Other (See Comments)    dizziness    Social History:   Social History   Socioeconomic History  . Marital status: Married    Spouse name: Not on file  . Number of children: 3  . Years of education: Not on file  . Highest education level: Not on file  Occupational History  . Occupation: DISABLED  Social Needs  . Financial resource strain: Not on file  . Food insecurity:    Worry:  Not on file    Inability: Not on file  . Transportation needs:    Medical: Not on file    Non-medical: Not on file  Tobacco Use  . Smoking status: Current Every Day Smoker    Packs/day: 0.50    Years: 30.00    Pack years: 15.00    Types: Cigarettes  . Smokeless tobacco: Never Used  . Tobacco comment: Took information today to call for help with support to quit   Substance and Sexual Activity  . Alcohol use: Yes    Alcohol/week: 0.0 oz    Comment: Reports none in over a month, trying to quit   . Drug use: No  . Sexual activity: Not Currently  Lifestyle  . Physical activity:    Days per week: Not on file    Minutes per session: Not on file  . Stress: Not on file  Relationships  . Social connections:    Talks on phone: Not on file    Gets together: Not on file    Attends religious service: Not on file    Active member of club or organization: Not on file    Attends meetings of clubs or organizations: Not on file    Relationship status: Not on file  . Intimate partner violence:    Fear of current or ex partner: Not on file    Emotionally abused: Not on file    Physically abused: Not on file    Forced sexual activity: Not on file  Other Topics Concern  . Not on file  Social History Narrative   He smokes about a pack per day and he has been smoking since he was 51  years of age.  He drinks alcohol occasionally, but he denies any illicit drug abuse.  He is presently on disability.    Lives with wife in a 2 story home.  Has 2 children.   Previously worked in [employer], last worked in 1998.   Highest level of education:  11th grade            Family History:    Family History  Problem Relation Age of Onset  . Cancer Mother        brain tumor  . Hypertension Mother   . Diabetes Father        Deceased, 68  . Heart disease Maternal Grandmother   . Hypertension Unknown        Family History  . Stroke Unknown        Family History  . Diabetes Unknown        Family History  . Diabetes Daughter      ROS:  Please see the history of present illness.   All other ROS reviewed and negative.     Physical Exam/Data:   Vitals:   04/22/18 1124 04/22/18 1130 04/22/18 1236 04/22/18 1456  BP:  100/85 114/78 113/70  Pulse:   (!) 57 61  Resp:  16 17 18   Temp: 97.8 F (36.6 C) 97.8 F (36.6 C) 97.7 F (36.5 C) 98.7 F (37.1 C)  TempSrc: Oral Oral Oral Oral  SpO2:  97% 97% 96%  Weight:      Height:        Intake/Output Summary (Last 24 hours) at 04/22/2018 1651 Last data filed at 04/22/2018 1129 Gross per 24 hour  Intake 3 ml  Output 200 ml  Net -197 ml   Filed Weights   04/21/18 1807  Weight: (!) 411 lb (186.4  kg)   Body mass index is 61.58 kg/m.  General:  Well nourished, well developed, in no acute distress HEENT: normal Lymph: no adenopathy Neck: no JVD Endocrine:  No thryomegaly Vascular: No carotid bruits; FA pulses 2+ bilaterally without bruits  Cardiac:  normal S1, S2; RRR; no murmur  Lungs:  clear to auscultation bilaterally, no wheezing, rhonchi or rales  Abd: soft, nontender, no hepatomegaly  Ext: no edema Musculoskeletal:  No deformities, BUE and BLE strength normal and equal Skin: warm and dry  Neuro:  CNs 2-12 intact, no focal abnormalities noted Psych:  Normal affect   EKG:  Not done Telemetry:   Non-tele  Relevant CV Studies:  Echo 04/15/17 Study Conclusions - Left ventricle: The cavity size was severely dilated. Wall   thickness was increased in a pattern of mild LVH. The estimated   ejection fraction was 15%. Diffuse hypokinesis. Features are   consistent with a pseudonormal left ventricular filling pattern,   with concomitant abnormal relaxation and increased filling   pressure (grade 2 diastolic dysfunction). - Aortic valve: There was no stenosis. There was trivial   regurgitation. - Mitral valve: There was trivial regurgitation. - Left atrium: The atrium was mildly to moderately dilated. - Right ventricle: Poorly visualized. The cavity size was normal.   Systolic function was mildly reduced. - Right atrium: The atrium was mildly dilated. - Pulmonary arteries: No complete TR doppler jet so unable to   estimate PA systolic pressure. - Systemic veins: IVC measured 2.3 cm with > 50% respirophasic   variation, suggesting RA pressure 8 mmHg. - Pericardium, extracardiac: There was no pericardial effusion.  Impressions: - Severely dilated LV with EF 15%. Moderate diastolic dysfunction.   Normal RV size with mildly decreased systolic function. No   significant valvular abnormalities.  Right/Left Heart Cath and Coronary Angiography 04/14/17  Conclusion   1. No significant coronary disease.  2. Mildly elevated right heart filling pressure, PWCP normal.  3. Low, but not markedly low, cardiac output.    Echo 02/25/17: EF 25-30%, diffuse hypokinesis  Laboratory Data:  Chemistry Recent Labs  Lab 04/21/18 1150 04/22/18 0242  NA 138 140  K 3.6 3.5  CL 102 102  CO2 24 27  GLUCOSE 230* 196*  BUN 15 15  CREATININE 1.06 1.32*  CALCIUM 9.6 9.4  GFRNONAA  --  >60  GFRAA  --  >60  ANIONGAP  --  11    Recent Labs  Lab 04/21/18 1150 04/22/18 0242 04/22/18 0623  PROT 8.1 8.2*  --   ALBUMIN 4.1 3.8  --   AST 84* 96*  --   ALT 178* 182*  --   ALKPHOS 356* 335*  --    BILITOT 4.6* 5.0* 4.9*   Hematology Recent Labs  Lab 04/21/18 1150 04/22/18 0242  WBC 6.9 9.6  RBC 5.11 5.23  HGB 15.4 15.0  HCT 43.8 42.7  MCV 85.7 81.6  MCH  --  28.7  MCHC 35.0 35.1  RDW 15.3 14.6  PLT 280.0 285   Cardiac EnzymesNo results for input(s): TROPONINI in the last 168 hours. No results for input(s): TROPIPOC in the last 168 hours.  BNPNo results for input(s): BNP, PROBNP in the last 168 hours.  DDimer No results for input(s): DDIMER in the last 168 hours.  Radiology/Studies:  US Abdomen Complete  Result Date: 04/22/2018 CLINICAL DATA:  Elevated liver function tests, nausea and postprandial pain. EXAM: ABDOMEN ULTRASOUND COMPLETE COMPARISON:  None. FINDINGS: Limited assessment due to large body  habitus. Gallbladder: Multiple echogenic gallstones measuring to 18 mm with acoustic shadowing. Gallbladder sludge. No gallbladder wall thickening or pericholecystic fluid. No sonographic Murphy's sign elicited. Common bile duct: Diameter: 3 mm Liver: No focal lesion identified. Increased parenchymal echogenicity. Portal vein is patent on color Doppler imaging with normal direction of blood flow towards the liver. IVC: No abnormality visualized. Pancreas: Pancreatic head and included body are normal. Spleen: Limited evaluation, no splenomegaly. Right Kidney: Length: 10.6 cm. Echogenicity within normal limits. No mass or hydronephrosis visualized. Left Kidney: Not sonographically identified. Abdominal aorta: No aneurysm visualized. Other findings: None. IMPRESSION: 1. Habitus limited examination. 2. Cholelithiasis without sonographic findings of acute cholecystitis. 3. Hepatic steatosis/hepatocellular disease. Electronically Signed   By: Elon Alas M.D.   On: 04/22/2018 04:47   Mr 3d Recon At Scanner  Result Date: 04/22/2018 CLINICAL DATA:  Postprandial nausea and vomiting. Congestive heart failure. Poor appetite. Vomiting. Dark urine and abnormal blood work. Malaise. Abnormal  elevated liver function tests. Cholelithiasis. Hepatic steatosis suspected on ultrasound. EXAM: MRI ABDOMEN WITHOUT AND WITH CONTRAST (INCLUDING MRCP) TECHNIQUE: Multiplanar multisequence MR imaging of the abdomen was performed both before and after the administration of intravenous contrast. Heavily T2-weighted images of the biliary and pancreatic ducts were obtained, and three-dimensional MRCP images were rendered by post processing. CONTRAST:  70mL MULTIHANCE GADOBENATE DIMEGLUMINE 529 MG/ML IV SOLN COMPARISON:  Multiple exams, including 04/22/2018 and 06/12/2015 FINDINGS: Despite efforts by the technologist and patient, motion artifact is present on today's exam and could not be eliminated. This reduces exam sensitivity and specificity. Body habitus reduces diagnostic sensitivity and specificity. Lower chest: Moderate cardiomegaly. Hepatobiliary: 1.1 cm gallstone in the gallbladder. No appreciable degree of steatosis. No focal abnormal hepatic lesion. No biliary dilatation or significant irregularity or normal enhancement along the biliary tree. Pancreas:  Unremarkable Spleen:  Unremarkable Adrenals/Urinary Tract: 1.5 by 1.7 cm nodule of the left adrenal gland appears to demonstrates signal dropout on out of phase images favoring adenoma. This is not appreciably changed in size from 06/12/2015. Several tiny T2 hyperintense renal lesions are likely cysts but technically too small to characterize given the degree of motion artifact. Stomach/Bowel: Unremarkable Vascular/Lymphatic:  Unremarkable Other:  No supplemental non-categorized findings. Musculoskeletal: Unremarkable IMPRESSION: 1. There is 1.1 cm gallstone in the gallbladder, but no appreciable hepatic steatosis or focal hepatic lesion to further explain the patient's abnormal liver enzymes. 2. Small chronically stable left adrenal nodule has an appearance favoring adrenal adenoma. 3. Cardiomegaly. Electronically Signed   By: Van Clines M.D.   On:  04/22/2018 15:16   Mr Abdomen Mrcp Moise Boring Contast  Result Date: 04/22/2018 CLINICAL DATA:  Postprandial nausea and vomiting. Congestive heart failure. Poor appetite. Vomiting. Dark urine and abnormal blood work. Malaise. Abnormal elevated liver function tests. Cholelithiasis. Hepatic steatosis suspected on ultrasound. EXAM: MRI ABDOMEN WITHOUT AND WITH CONTRAST (INCLUDING MRCP) TECHNIQUE: Multiplanar multisequence MR imaging of the abdomen was performed both before and after the administration of intravenous contrast. Heavily T2-weighted images of the biliary and pancreatic ducts were obtained, and three-dimensional MRCP images were rendered by post processing. CONTRAST:  68mL MULTIHANCE GADOBENATE DIMEGLUMINE 529 MG/ML IV SOLN COMPARISON:  Multiple exams, including 04/22/2018 and 06/12/2015 FINDINGS: Despite efforts by the technologist and patient, motion artifact is present on today's exam and could not be eliminated. This reduces exam sensitivity and specificity. Body habitus reduces diagnostic sensitivity and specificity. Lower chest: Moderate cardiomegaly. Hepatobiliary: 1.1 cm gallstone in the gallbladder. No appreciable degree of steatosis. No focal abnormal hepatic  lesion. No biliary dilatation or significant irregularity or normal enhancement along the biliary tree. Pancreas:  Unremarkable Spleen:  Unremarkable Adrenals/Urinary Tract: 1.5 by 1.7 cm nodule of the left adrenal gland appears to demonstrates signal dropout on out of phase images favoring adenoma. This is not appreciably changed in size from 06/12/2015. Several tiny T2 hyperintense renal lesions are likely cysts but technically too small to characterize given the degree of motion artifact. Stomach/Bowel: Unremarkable Vascular/Lymphatic:  Unremarkable Other:  No supplemental non-categorized findings. Musculoskeletal: Unremarkable IMPRESSION: 1. There is 1.1 cm gallstone in the gallbladder, but no appreciable hepatic steatosis or focal hepatic  lesion to further explain the patient's abnormal liver enzymes. 2. Small chronically stable left adrenal nodule has an appearance favoring adrenal adenoma. 3. Cardiomegaly. Electronically Signed   By: Van Clines M.D.   On: 04/22/2018 15:16    Assessment and Plan:   Chronic systolic CHF, non-ischemic CM -EF 15% by echo in 03/2017. No CAD by cath in 2018. Likely related to prior heavy alcohol use. He was seen by EP and decided against ICD given marked obesity. QRS not wide enough for CRT. -Medical therapy includes Spironolacotne 25 mg daily, Toprol XL 100 mg, losartan 50 mg BID. No Entresto due to angioedema with ACE-I. He had side effects from digoxin in the past and does not want to take. Headaches with Bidil and does not want to take.  -Today he is stable with no dyspnea or edema. He has actually been losing wt.   Obesity -Body mass index is 61.33 kg/m. Wt is down from 428 lbs at office visit on 03/25/18 to 409.5 today. He is eating lots of salads and is taking his diuretics regularly (he was taking sporadically before).  -He has been considering bariatric surgery, but feels that he is doing well with dietary wt loss for now.   Smoking -Advise cessation   OSA -Uses CPAP  Hx Afib -No known afib recently. Has not been anticoagulated in 3 years and reportedly does not want to be.   Pre-operative assessment: According to the RCRI this patient is a class II risk with 0.9% risk of major cardiac event peri-operatively, however he has a slightly elevated risk due to his severe cardiomyopathy. He has been active lately at about 4 mets. He has been stable from a heart failure standpoint for several years. He had no ACD by cath in 2018. Therefore, he is at acceptable risk without need for further cardiac testing.  He will need to be monitored closely and avoid volume overload.   For questions or updates, please contact [place] Please consult [email or web address] for contact info under  Cardiology/STEMI.   Signed, Daune Perch, NP  04/22/2018 4:51 PM  Patient seen, examined. Available data reviewed. Agree with findings, assessment, and plan as outlined by Daune Perch, NP.   The patient is independently interviewed and examined.  He is a very nice, morbidly obese male in no distress.  JVP is normal, lungs are clear, HEENT is normal, heart is regular rate and rhythm with no murmur or gallop, abdomen is soft, obese, nontender.  Extremities have trace pretibial edema.  Skin is warm and dry without rash.  The patient is here with symptomatic cholelithiasis and elevated liver function tests.  He has been evaluated by general surgery and gastroenterology and there is consideration for cholecystectomy.  While he has severe LV systolic dysfunction with LVEF 15%, he appears very well compensated on exam with minimal cardiac symptoms.  He has normal coronary  arteries by catheterization last year.  His overall risk of anesthesia and gallbladder surgery is mildly increased based on his severe LV dysfunction, but his risk is certainly not prohibitive.  He can proceed with surgery if needed without further cardiac evaluation at this time.  Recommend continuing his cardiac medicines without change.  Please call if any acute cardiac or perioperative problems arise.   Sherren Mocha, M.D. 04/22/2018 6:40 PM

## 2018-04-22 NOTE — ED Notes (Signed)
Patient NPO at this time, No Diet was ordered for Lunch. 

## 2018-04-22 NOTE — ED Notes (Signed)
Pt given blanket and socks 

## 2018-04-22 NOTE — ED Notes (Signed)
Dr. Thomes Dinning at bedside

## 2018-04-22 NOTE — ED Notes (Addendum)
Pt returns from MRI and sits in chair beside bed. Pt  requesting [employer]. Ice chips given.Pt reqwuest to wait for lovenox injection.

## 2018-04-22 NOTE — Progress Notes (Signed)
Patient does not know what his home BIPAP settings are. The BIPAP was set to autotitrating mode at 20/6. The patient complained that the pressure is not high enough. The lower limit was increased to 13 as the patient thinks this is his home setting.

## 2018-04-22 NOTE — ED Notes (Signed)
Taken to US.

## 2018-04-22 NOTE — Consult Note (Signed)
Reason for Consult:Increased liver test test Referring Physician: Hospital team  [name] is an 52 y.o. male.  HPI: patient seen and examined and hospital computer chart reviewed and he's only had one day of some mild nausea and mild upper abdominal discomfort and lately he's been watching his diet and trying to lose weight and he does not drink every day and he has never been told he had a liver problem but about a month ago he was started on antibiotics metformin and Zoloft and about 2 weeks ago began itching and having dark urine and his liver tests were elevated and he was recommended to come to the emergency room and he has not had any fever and other than one bout of constipation about a week ago has had no lower bowel complaints and his never donated blood and he  never used IV drugs but does have tattoos and his family history is negative for any liver disease and currently he is hungry and wants to eat and his ultrasound and MRI were reviewed as well  Past Medical History:  Diagnosis Date  . Alcohol abuse   . Anxiety state, unspecified   . Atrial fibrillation ([place])   . CHF (congestive heart failure) ([place])   . Chronic systolic heart failure ([place])   . Diabetes mellitus, type II ([place])   . Edema   . History of medication noncompliance   . Migraine   . Obesity, unspecified   . Obstructive sleep apnea   . Psychiatric disorder   . Pulmonary embolism ([place])   . Shortness of breath     Past Surgical History:  Procedure Laterality Date  . CARDIAC CATHETERIZATION    . CARDIAC CATHETERIZATION N/A 08/13/2015   Procedure: Right/Left Heart Cath and Coronary Angiography;  Surgeon: [name], MD;  Location: [facility];  Service: Cardiovascular;  Laterality: N/A;  . RIGHT/LEFT HEART CATH AND CORONARY ANGIOGRAPHY N/A 04/14/2017   Procedure: Right/Left Heart Cath and Coronary Angiography;  Surgeon: [name], MD;  Location: [facility];  Service: Cardiovascular;   Laterality: N/A;  . TESTICLE SURGERY      Family History  Problem Relation Age of Onset  . Cancer Mother        brain tumor  . Hypertension Mother   . Diabetes Father        Deceased, 22  . Heart disease Maternal Grandmother   . Hypertension Unknown        Family History  . Stroke Unknown        Family History  . Diabetes Unknown        Family History  . Diabetes Daughter     Social History:  reports that he has been smoking cigarettes.  He has a 15.00 pack-year smoking history. He has never used smokeless tobacco. He reports that he drinks alcohol. He reports that he does not use drugs.  Allergies:  Allergies  Allergen Reactions  . Ace Inhibitors Anaphylaxis and Swelling  . Buspirone Other (See Comments)    dizziness    Medications: I have reviewed the patient's current medications.  Results for orders placed or performed during the hospital encounter of 04/21/18 (from the past 48 hour(s))  CBC     Status: None   Collection Time: 04/22/18  2:42 AM  Result Value Ref Range   WBC 9.6 4.0 - 10.5 K/uL   RBC 5.23 4.22 - 5.81 MIL/uL   Hemoglobin 15.0 13.0 - 17.0 g/dL  HCT 42.7 39.0 - 52.0 %   MCV 81.6 78.0 - 100.0 fL   MCH 28.7 26.0 - 34.0 pg   MCHC 35.1 30.0 - 36.0 g/dL   RDW 14.6 11.5 - 15.5 %   Platelets 285 150 - 400 K/uL    Comment: Performed at [facility], [place] [address], [place], [place] [address]  Comprehensive metabolic panel     Status: Abnormal   Collection Time: 04/22/18  2:42 AM  Result Value Ref Range   Sodium 140 135 - 145 mmol/L   Potassium 3.5 3.5 - 5.1 mmol/L   Chloride 102 101 - 111 mmol/L   CO2 27 22 - 32 mmol/L   Glucose, Bld 196 (H) 65 - 99 mg/dL   BUN 15 6 - 20 mg/dL   Creatinine, Ser 1.32 (H) 0.61 - 1.24 mg/dL   Calcium 9.4 8.9 - 10.3 mg/dL   Total Protein 8.2 (H) 6.5 - 8.1 g/dL   Albumin 3.8 3.5 - 5.0 g/dL   AST 96 (H) 15 - 41 U/L   ALT 182 (H) 17 - 63 U/L   Alkaline Phosphatase 335 (H) 38 - 126 U/L   Total Bilirubin 5.0 (H)  0.3 - 1.2 mg/dL   GFR calc non Af Amer >60 >60 mL/min   GFR calc Af Amer >60 >60 mL/min    Comment: (NOTE) The eGFR has been calculated using the CKD EPI equation. This calculation has not been validated in all clinical situations. eGFR's persistently <60 mL/min signify possible Chronic Kidney Disease.    Anion gap 11 5 - 15    Comment: Performed at [facility] [address] [place], [place] [address]  Lipase, blood     Status: Abnormal   Collection Time: 04/22/18  2:42 AM  Result Value Ref Range   Lipase 102 (H) 11 - 51 U/L    Comment: Performed at [facility], [place] [address], [place], [place] [address]  Urinalysis, Routine w reflex microscopic     Status: Abnormal   Collection Time: 04/22/18  3:45 AM  Result Value Ref Range   Color, Urine AMBER (A) YELLOW    Comment: BIOCHEMICALS MAY BE AFFECTED BY COLOR   APPearance HAZY (A) CLEAR   Specific Gravity, Urine 1.015 1.005 - 1.030   pH 5.0 5.0 - 8.0   Glucose, UA NEGATIVE NEGATIVE mg/dL   Hgb urine dipstick NEGATIVE NEGATIVE   Bilirubin Urine MODERATE (A) NEGATIVE   Ketones, ur NEGATIVE NEGATIVE mg/dL   Protein, ur NEGATIVE NEGATIVE mg/dL   Nitrite NEGATIVE NEGATIVE   Leukocytes, UA SMALL (A) NEGATIVE   RBC / HPF 0-5 0 - 5 RBC/hpf   WBC, UA 21-50 0 - 5 WBC/hpf   Bacteria, UA NONE SEEN NONE SEEN   Squamous Epithelial / LPF 0-5 0 - 5   Mucus PRESENT    Hyaline Casts, UA PRESENT     Comment: Performed at [facility], [place] [address] [place], [place] [address]  Protime-INR     Status: None   Collection Time: 04/22/18  6:23 AM  Result Value Ref Range   Prothrombin Time 13.1 11.4 - 15.2 seconds   INR 1.00     Comment: Performed at [facility] [address], [place], [place] [address]  Bilirubin, fractionated(tot/dir/indir)     Status: Abnormal   Collection Time: 04/22/18  6:23 AM  Result Value Ref Range   Total Bilirubin 4.9 (H) 0.3 - 1.2 mg/dL  Bilirubin, Direct 2.5 (H) 0.1 - 0.5 mg/dL    Indirect Bilirubin 2.4 (H) 0.3 - 0.9 mg/dL    Comment: Performed at [facility] [address], [place], [place] [address]  CBG monitoring, ED     Status: Abnormal   Collection Time: 04/22/18  7:15 AM  Result Value Ref Range   Glucose-Capillary 190 (H) 65 - 99 mg/dL  CBG monitoring, ED     Status: Abnormal   Collection Time: 04/22/18 11:35 AM  Result Value Ref Range   Glucose-Capillary 190 (H) 65 - 99 mg/dL  CBG monitoring, ED     Status: Abnormal   Collection Time: 04/22/18  1:13 PM  Result Value Ref Range   Glucose-Capillary 201 (H) 65 - 99 mg/dL  CBG monitoring, ED     Status: Abnormal   Collection Time: 04/22/18  4:13 PM  Result Value Ref Range   Glucose-Capillary 167 (H) 65 - 99 mg/dL    US Abdomen Complete  Result Date: 04/22/2018 CLINICAL DATA:  Elevated liver function tests, nausea and postprandial pain. EXAM: ABDOMEN ULTRASOUND COMPLETE COMPARISON:  None. FINDINGS: Limited assessment due to large body habitus. Gallbladder: Multiple echogenic gallstones measuring to 18 mm with acoustic shadowing. Gallbladder sludge. No gallbladder wall thickening or pericholecystic fluid. No sonographic Murphy's sign elicited. Common bile duct: Diameter: 3 mm Liver: No focal lesion identified. Increased parenchymal echogenicity. Portal vein is patent on color Doppler imaging with normal direction of blood flow towards the liver. IVC: No abnormality visualized. Pancreas: Pancreatic head and included body are normal. Spleen: Limited evaluation, no splenomegaly. Right Kidney: Length: 10.6 cm. Echogenicity within normal limits. No mass or hydronephrosis visualized. Left Kidney: Not sonographically identified. Abdominal aorta: No aneurysm visualized. Other findings: None. IMPRESSION: 1. Habitus limited examination. 2. Cholelithiasis without sonographic findings of acute cholecystitis. 3. Hepatic steatosis/hepatocellular disease. Electronically Signed   By: Elon Alas M.D.   On: 04/22/2018  04:47   Mr 3d Recon At Scanner  Result Date: 04/22/2018 CLINICAL DATA:  Postprandial nausea and vomiting. Congestive heart failure. Poor appetite. Vomiting. Dark urine and abnormal blood work. Malaise. Abnormal elevated liver function tests. Cholelithiasis. Hepatic steatosis suspected on ultrasound. EXAM: MRI ABDOMEN WITHOUT AND WITH CONTRAST (INCLUDING MRCP) TECHNIQUE: Multiplanar multisequence MR imaging of the abdomen was performed both before and after the administration of intravenous contrast. Heavily T2-weighted images of the biliary and pancreatic ducts were obtained, and three-dimensional MRCP images were rendered by post processing. CONTRAST:  65m MULTIHANCE GADOBENATE DIMEGLUMINE 529 MG/ML IV SOLN COMPARISON:  Multiple exams, including 04/22/2018 and 06/12/2015 FINDINGS: Despite efforts by the technologist and patient, motion artifact is present on today's exam and could not be eliminated. This reduces exam sensitivity and specificity. Body habitus reduces diagnostic sensitivity and specificity. Lower chest: Moderate cardiomegaly. Hepatobiliary: 1.1 cm gallstone in the gallbladder. No appreciable degree of steatosis. No focal abnormal hepatic lesion. No biliary dilatation or significant irregularity or normal enhancement along the biliary tree. Pancreas:  Unremarkable Spleen:  Unremarkable Adrenals/Urinary Tract: 1.5 by 1.7 cm nodule of the left adrenal gland appears to demonstrates signal dropout on out of phase images favoring adenoma. This is not appreciably changed in size from 06/12/2015. Several tiny T2 hyperintense renal lesions are likely cysts but technically too small to characterize given the degree of motion artifact. Stomach/Bowel: Unremarkable Vascular/Lymphatic:  Unremarkable Other:  No supplemental non-categorized findings. Musculoskeletal: Unremarkable IMPRESSION: 1. There is 1.1 cm gallstone in the gallbladder, but no appreciable hepatic steatosis or focal hepatic lesion to further  explain the patient's abnormal liver enzymes. 2. Small chronically stable left adrenal nodule has an appearance favoring adrenal adenoma. 3. Cardiomegaly. Electronically Signed   By: Van Clines M.D.   On: 04/22/2018 15:16   Mr Abdomen Mrcp Moise Boring Contast  Result Date: 04/22/2018 CLINICAL DATA:  Postprandial nausea and vomiting. Congestive heart failure. Poor appetite. Vomiting. Dark urine and abnormal blood work. Malaise. Abnormal elevated liver function tests. Cholelithiasis. Hepatic steatosis suspected on ultrasound. EXAM: MRI ABDOMEN WITHOUT AND WITH CONTRAST (INCLUDING MRCP) TECHNIQUE: Multiplanar multisequence MR imaging of the abdomen was performed both before and after the administration of intravenous contrast. Heavily T2-weighted images of the biliary and pancreatic ducts were obtained, and three-dimensional MRCP images were rendered by post processing. CONTRAST:  74m MULTIHANCE GADOBENATE DIMEGLUMINE 529 MG/ML IV SOLN COMPARISON:  Multiple exams, including 04/22/2018 and 06/12/2015 FINDINGS: Despite efforts by the technologist and patient, motion artifact is present on today's exam and could not be eliminated. This reduces exam sensitivity and specificity. Body habitus reduces diagnostic sensitivity and specificity. Lower chest: Moderate cardiomegaly. Hepatobiliary: 1.1 cm gallstone in the gallbladder. No appreciable degree of steatosis. No focal abnormal hepatic lesion. No biliary dilatation or significant irregularity or normal enhancement along the biliary tree. Pancreas:  Unremarkable Spleen:  Unremarkable Adrenals/Urinary Tract: 1.5 by 1.7 cm nodule of the left adrenal gland appears to demonstrates signal dropout on out of phase images favoring adenoma. This is not appreciably changed in size from 06/12/2015. Several tiny T2 hyperintense renal lesions are likely cysts but technically too small to characterize given the degree of motion artifact. Stomach/Bowel: Unremarkable  Vascular/Lymphatic:  Unremarkable Other:  No supplemental non-categorized findings. Musculoskeletal: Unremarkable IMPRESSION: 1. There is 1.1 cm gallstone in the gallbladder, but no appreciable hepatic steatosis or focal hepatic lesion to further explain the patient's abnormal liver enzymes. 2. Small chronically stable left adrenal nodule has an appearance favoring adrenal adenoma. 3. Cardiomegaly. Electronically Signed   By: WVan ClinesM.D.   On: 04/22/2018 15:16    ROSnegative except above Blood pressure 113/70, pulse 61, temperature 98.7 F (37.1 C), temperature source Oral, resp. rate 18, height 5' 8.5" (1.74 m), weight (!) 186.4 kg (411 lb), SpO2 96 %. Physical Exam no acute distress lying comfortably in the bed vital signs stable afebrile lungs are clear regular rate and rhythm abdomen is soft nontender labs reviewed platelets normal coags normal Assessment/Plan: Elevated liver tests and gallstones and itching doubt CBD stones probably does have a liver process causing his problems Plan: I think one of his medicines is the most likely cause But we'll work him up for chronic liver disease with the customary serologies and labs and will stop all unnecessary medicines particularly the new onset and allow him to eat tonight and we will check on tomorrow  [facility] 04/22/2018, 5:15 PM

## 2018-04-22 NOTE — ED Notes (Signed)
Clear Liquid Diet was ordered for Dinner.

## 2018-04-22 NOTE — ED Notes (Signed)
Patient transported to Ultrasound 

## 2018-04-22 NOTE — ED Notes (Signed)
Patient transported to MRI 

## 2018-04-22 NOTE — Progress Notes (Signed)
PROGRESS NOTE                                                                                                                                                                                                             Patient Demographics:    [name], is a 52 y.o. male, DOB - 1966-10-20, EQA:[id number]  Admit date - 04/21/2018   Admitting Physician [name], MD  Outpatient Primary MD for the patient is [name], MD  LOS - 0   Chief Complaint  Patient presents with  . Abnormal Lab       Brief Narrative   52 y.o. 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  His work-up significant for Cholelithiasis    Subjective:    [name] today has, No headache, No chest pain, denies any nausea, vomiting or abdominal pain since admission .   Assessment  & Plan :    Principal Problem:   Jaundice Active Problems:   Morbid obesity (HCC)   GAD (generalized anxiety disorder)   Essential hypertension   Chronic combined systolic and diastolic CHF (congestive heart failure) (HCC)   OSA (obstructive sleep apnea) with BiPap and oxygen   Type II diabetes mellitus with manifestations (HCC)   Alcohol abuse   PE (pulmonary thromboembolism) (HCC)   Cholelithiasis   CKD (chronic kidney disease), stage II   Jaundice   - Presents with several weeks of postprandial nausea and more recent pruritis, dark urine, and N/V  - Found to have alk phos 335, AST 96, ALT 182, and total bili 5.0  - Abdomen mildly tender in epigastrium; no fever or leukocytosis  - Abd [place] with cholelithiasis, but no evidence for  cholecystitis and no CBD dilation , MRCP with 1.1 cm stone, but with dense of choledocholithiasis. -We will repeat LFT, BMP in a.m., this is most likely due to passed stone.  Chronic combined systolic & diastolic CHF  - Appears well-compensated  - EF only 15% on TTE from one yr ago, follows with cardiology, refused ICD  -  Hold diuretics initially given recent anorexia and vomiting  - Follow daily wt and I/O's, SLIV, continue beta-blocker as tolerated and hold losartan in light of increased creatinine    Hypertension  - BP at goal  - Continue Toprol as tolerated, hold losartan in light increased creatinine    Type II DM  - A1c was 7.6% in September  - Diet-controlled currently  - Follow CBG's and use SSI with Novolog as needed    Generalized anxiety disorder  - Stable, continue Klonopin with dose-reduction and hold Zoloft in light of #1     OSA  - Continue BiPAP qHS         Code Status : Full code  Family Communication  : None at bedside  Disposition Plan  : Home once stable  Consults  : GI, general surgery  Procedures  : None  DVT Prophylaxis  : Subcu Lovenox  Lab Results  Component Value Date   PLT 285 04/22/2018    Antibiotics  :    Anti-infectives (From admission, onward)   None        Objective:   Vitals:   04/22/18 1124 04/22/18 1130 04/22/18 1236 04/22/18 1456  BP:  100/85 114/78 113/70  Pulse:   (!) 57 61  Resp:  16 17 18   Temp: 97.8 F (36.6 C) 97.8 F (36.6 C) 97.7 F (36.5 C) 98.7 F (37.1 C)  TempSrc: Oral Oral Oral Oral  SpO2:  97% 97% 96%  Weight:      Height:        Wt Readings from Last 3 Encounters:  04/21/18 (!) 186.4 kg (411 lb)  04/21/18 (!) 186.4 kg (411 lb)  04/02/18 (!) 197.3 kg (435 lb)     Intake/Output Summary (Last 24 hours) at 04/22/2018 1550 Last data filed at 04/22/2018 1129 Gross per 24 hour  Intake 3 ml  Output 200 ml  Net -197 ml     Physical Exam  Awake Alert, Oriented X 3, No new F.N  deficits, Normal affect  Symmetrical Chest wall movement, Good air movement bilaterally, CTAB RRR,No Gallops,Rubs or new Murmurs, No Parasternal Heave +ve B.Sounds, Abd Soft, some epigastric tenderness to palpation no rebound - guarding or rigidity. No Cyanosis, Clubbing or edema, No new Rash or bruise      Data Review:    CBC Recent Labs  Lab 04/21/18 1150 04/22/18 0242  WBC 6.9 9.6  HGB 15.4 15.0  HCT 43.8 42.7  PLT 280.0 285  MCV 85.7 81.6  MCH  --  28.7  MCHC 35.0 35.1  RDW 15.3 14.6  LYMPHSABS 3.1  --   MONOABS 0.8  --   EOSABS 0.3  --   BASOSABS 0.1  --     Chemistries  Recent Labs  Lab 04/21/18 1150 04/22/18 0242 04/22/18 0623  NA 138 140  --   K 3.6 3.5  --   CL 102 102  --   CO2 24 27  --   GLUCOSE 230* 196*  --   BUN 15 15  --   CREATININE 1.06 1.32*  --   CALCIUM 9.6 9.4  --   AST 84* 96*  --   ALT 178* 182*  --   ALKPHOS 356* 335*  --   BILITOT 4.6* 5.0* 4.9*   ------------------------------------------------------------------------------------------------------------------ No results for input(s): CHOL, HDL, LDLCALC, TRIG, CHOLHDL, LDLDIRECT in the last 72 hours.  Lab Results  Component Value Date   HGBA1C 7.6 (H) 03/25/2018   ------------------------------------------------------------------------------------------------------------------ No  results for input(s): TSH, T4TOTAL, T3FREE, THYROIDAB in the last 72 hours.  Invalid input(s): FREET3 ------------------------------------------------------------------------------------------------------------------ Recent Labs    04/21/18 1150  VITAMINB12 1,198*    Coagulation profile Recent Labs  Lab 04/22/18 0623  INR 1.00    No results for input(s): DDIMER in the last 72 hours.  Cardiac Enzymes No results for input(s): CKMB, TROPONINI, MYOGLOBIN in the last 168 hours.  Invalid input(s):  CK ------------------------------------------------------------------------------------------------------------------    Component Value Date/Time   BNP 212.1 (H) 03/19/2018 1411   BNP 73 08/24/2017 1059    Inpatient Medications  Scheduled Meds: . clonazePAM  0.5 mg Oral BID  . enoxaparin (LOVENOX) injection  90 mg Subcutaneous Daily  . insulin aspart  0-9 Units Subcutaneous Q4H  . metoprolol succinate  100 mg Oral Daily  . nicotine  21 mg Transdermal Daily  . sodium chloride flush  3 mL Intravenous Q12H   Continuous Infusions: . sodium chloride     PRN Meds:.sodium chloride, fentaNYL (SUBLIMAZE) injection, hydrOXYzine, ondansetron **OR** ondansetron (ZOFRAN) IV, sodium chloride flush  Micro Results No results found for this or any previous visit (from the past 240 hour(s)).  Radiology Reports US Abdomen Complete  Result Date: 04/22/2018 CLINICAL DATA:  Elevated liver function tests, nausea and postprandial pain. EXAM: ABDOMEN ULTRASOUND COMPLETE COMPARISON:  None. FINDINGS: Limited assessment due to large body habitus. Gallbladder: Multiple echogenic gallstones measuring to 18 mm with acoustic shadowing. Gallbladder sludge. No gallbladder wall thickening or pericholecystic fluid. No sonographic Murphy's sign elicited. Common bile duct: Diameter: 3 mm Liver: No focal lesion identified. Increased parenchymal echogenicity. Portal vein is patent on color Doppler imaging with normal direction of blood flow towards the liver. IVC: No abnormality visualized. Pancreas: Pancreatic head and included body are normal. Spleen: Limited evaluation, no splenomegaly. Right Kidney: Length: 10.6 cm. Echogenicity within normal limits. No mass or hydronephrosis visualized. Left Kidney: Not sonographically identified. Abdominal aorta: No aneurysm visualized. Other findings: None. IMPRESSION: 1. Habitus limited examination. 2. Cholelithiasis without sonographic findings of acute cholecystitis. 3. Hepatic  steatosis/hepatocellular disease. Electronically Signed   By: Elon Alas M.D.   On: 04/22/2018 04:47   Mr 3d Recon At Scanner  Result Date: 04/22/2018 CLINICAL DATA:  Postprandial nausea and vomiting. Congestive heart failure. Poor appetite. Vomiting. Dark urine and abnormal blood work. Malaise. Abnormal elevated liver function tests. Cholelithiasis. Hepatic steatosis suspected on ultrasound. EXAM: MRI ABDOMEN WITHOUT AND WITH CONTRAST (INCLUDING MRCP) TECHNIQUE: Multiplanar multisequence MR imaging of the abdomen was performed both before and after the administration of intravenous contrast. Heavily T2-weighted images of the biliary and pancreatic ducts were obtained, and three-dimensional MRCP images were rendered by post processing. CONTRAST:  37m MULTIHANCE GADOBENATE DIMEGLUMINE 529 MG/ML IV SOLN COMPARISON:  Multiple exams, including 04/22/2018 and 06/12/2015 FINDINGS: Despite efforts by the technologist and patient, motion artifact is present on today's exam and could not be eliminated. This reduces exam sensitivity and specificity. Body habitus reduces diagnostic sensitivity and specificity. Lower chest: Moderate cardiomegaly. Hepatobiliary: 1.1 cm gallstone in the gallbladder. No appreciable degree of steatosis. No focal abnormal hepatic lesion. No biliary dilatation or significant irregularity or normal enhancement along the biliary tree. Pancreas:  Unremarkable Spleen:  Unremarkable Adrenals/Urinary Tract: 1.5 by 1.7 cm nodule of the left adrenal gland appears to demonstrates signal dropout on out of phase images favoring adenoma. This is not appreciably changed in size from 06/12/2015. Several tiny T2 hyperintense renal lesions are likely cysts but technically too small to characterize given the degree of motion  artifact. Stomach/Bowel: Unremarkable Vascular/Lymphatic:  Unremarkable Other:  No supplemental non-categorized findings. Musculoskeletal: Unremarkable IMPRESSION: 1. There is 1.1 cm  gallstone in the gallbladder, but no appreciable hepatic steatosis or focal hepatic lesion to further explain the patient's abnormal liver enzymes. 2. Small chronically stable left adrenal nodule has an appearance favoring adrenal adenoma. 3. Cardiomegaly. Electronically Signed   By: Van Clines M.D.   On: 04/22/2018 15:16   Mr Abdomen Mrcp Moise Boring Contast  Result Date: 04/22/2018 CLINICAL DATA:  Postprandial nausea and vomiting. Congestive heart failure. Poor appetite. Vomiting. Dark urine and abnormal blood work. Malaise. Abnormal elevated liver function tests. Cholelithiasis. Hepatic steatosis suspected on ultrasound. EXAM: MRI ABDOMEN WITHOUT AND WITH CONTRAST (INCLUDING MRCP) TECHNIQUE: Multiplanar multisequence MR imaging of the abdomen was performed both before and after the administration of intravenous contrast. Heavily T2-weighted images of the biliary and pancreatic ducts were obtained, and three-dimensional MRCP images were rendered by post processing. CONTRAST:  26m MULTIHANCE GADOBENATE DIMEGLUMINE 529 MG/ML IV SOLN COMPARISON:  Multiple exams, including 04/22/2018 and 06/12/2015 FINDINGS: Despite efforts by the technologist and patient, motion artifact is present on today's exam and could not be eliminated. This reduces exam sensitivity and specificity. Body habitus reduces diagnostic sensitivity and specificity. Lower chest: Moderate cardiomegaly. Hepatobiliary: 1.1 cm gallstone in the gallbladder. No appreciable degree of steatosis. No focal abnormal hepatic lesion. No biliary dilatation or significant irregularity or normal enhancement along the biliary tree. Pancreas:  Unremarkable Spleen:  Unremarkable Adrenals/Urinary Tract: 1.5 by 1.7 cm nodule of the left adrenal gland appears to demonstrates signal dropout on out of phase images favoring adenoma. This is not appreciably changed in size from 06/12/2015. Several tiny T2 hyperintense renal lesions are likely cysts but technically too  small to characterize given the degree of motion artifact. Stomach/Bowel: Unremarkable Vascular/Lymphatic:  Unremarkable Other:  No supplemental non-categorized findings. Musculoskeletal: Unremarkable IMPRESSION: 1. There is 1.1 cm gallstone in the gallbladder, but no appreciable hepatic steatosis or focal hepatic lesion to further explain the patient's abnormal liver enzymes. 2. Small chronically stable left adrenal nodule has an appearance favoring adrenal adenoma. 3. Cardiomegaly. Electronically Signed   By: WVan ClinesM.D.   On: 04/22/2018 15:16    Time Spent in minutes  No CHARGE   DPhillips ClimesM.D on 04/22/2018 at 3:50 PM  Between 7am to 7pm - Pager - [phone] After 7pm go to [email or web address] - password [email or web address] -  Office  [phone]

## 2018-04-22 NOTE — ED Provider Notes (Signed)
[facility] EMERGENCY DEPARTMENT Provider Note   CSN: [id number] Arrival date & time: 04/21/18  1746     History   Chief Complaint Chief Complaint  Patient presents with  . Abnormal Lab    HPI [name] is a 52 y.o. male.  52yo M w/ PMH including A fib, CHF, T2DM, PE, OSA, morbid obesity, alcohol abuse who p/w abnormal labs.  The patient went to his PCP office today to be evaluated for itching that has been bothering him for the past 1.5 weeks.  His PCP sent blood work and then called him his evening to tell him he should go to the ER due to elevated liver enzymes.  He notes that he has been dieting recently and has intentionally lost some weight.  However, over the past week he has noted nausea and epigastric pain when he tries to eat.  He states that it feels like he has a hangover.  He denies any heavy alcohol use, drink 2 beers yesterday and none today.  No heavy Tylenol use.  He has had occasional vomiting.  He reports constipation, last bowel movement was today. No fevers or SOB.  The history is provided by the patient.  Abnormal Lab    Past Medical History:  Diagnosis Date  . Alcohol abuse   . Anxiety state, unspecified   . Atrial fibrillation ([place])   . CHF (congestive heart failure) ([place])   . Chronic systolic heart failure ([place])   . Diabetes mellitus, type II ([place])   . Edema   . History of medication noncompliance   . Migraine   . Obesity, unspecified   . Obstructive sleep apnea   . Psychiatric disorder   . Pulmonary embolism ([place])   . Shortness of breath     Patient Active Problem List   Diagnosis Date Noted  . Jaundice 04/22/2018  . Cholelithiasis 04/22/2018  . CKD (chronic kidney disease), stage II 04/22/2018  . Routine general medical examination at a health care facility 02/03/2018  . Idiopathic gout 02/02/2018  . Cough 02/02/2018  . RTI (respiratory tract infection) 02/02/2018  . Pyuria 02/02/2018  . PE (pulmonary  thromboembolism) ([place]) 04/12/2017  . Hereditary and idiopathic peripheral neuropathy 07/16/2016  . Paroxysmal atrial fibrillation (HCC)   . Chronic anticoagulation, secondary to PE 03/2014 12/27/2014  . Osteoarthritis of both knees 12/27/2014  . NSVT (nonsustained ventricular tachycardia) ([place]) 09/26/2013  . Alcohol abuse 08/10/2013  . Hyperlipidemia with target LDL less than 100 06/29/2013  . Nonischemic cardiomyopathy ([place]) 01/26/2013  . Type II diabetes mellitus with manifestations ([place]) 04/13/2012  . Tobacco user 02/18/2012  . Migraine 08/10/2011  . ED (erectile dysfunction) 07/11/2011  . GAD (generalized anxiety disorder) 06/12/2010  . Morbid obesity ([place]) 06/11/2010  . Essential hypertension 06/11/2010  . Chronic combined systolic and diastolic CHF (congestive heart failure) ([place]) 06/11/2010  . Esophageal reflux 06/11/2010  . OSA (obstructive sleep apnea) with BiPap and oxygen 06/11/2010    Past Surgical History:  Procedure Laterality Date  . CARDIAC CATHETERIZATION    . CARDIAC CATHETERIZATION N/A 08/13/2015   Procedure: Right/Left Heart Cath and Coronary Angiography;  Surgeon: [name], MD;  Location: [facility];  Service: Cardiovascular;  Laterality: N/A;  . RIGHT/LEFT HEART CATH AND CORONARY ANGIOGRAPHY N/A 04/14/2017   Procedure: Right/Left Heart Cath and Coronary Angiography;  Surgeon: [name], MD;  Location: [facility];  Service: Cardiovascular;  Laterality: N/A;  . TESTICLE SURGERY  Home Medications    Prior to Admission medications   Medication Sig Start Date End Date Taking? Authorizing Provider  allopurinol (ZYLOPRIM) 100 MG tablet Take 1 tablet (100 mg total) by mouth daily. Patient taking differently: Take 100 mg by mouth daily as needed (gout).  02/03/18  Yes [name], MD  ammonium lactate (LAC-HYDRIN) 12 % cream Apply topically as needed for dry skin. Patient taking differently: Apply 1 g topically daily as needed  for dry skin.  04/21/18  Yes [name], FNP  aspirin EC 81 MG tablet Take 1 tablet (81 mg total) by mouth daily. 08/01/15  Yes [name], MD  clonazePAM (KLONOPIN) 1 MG tablet TAKE 1 TABLET BY MOUTH 3 TIMES DAILY AS NEEDED FOR ANXIETY Patient taking differently: Take 1 mg by mouth 2 (two) times daily. TAKE 1 TABLET BY MOUTH 2 TIMES DAILY CAN TAKE ANOTHER TABLET AS NEEDED FOR ANXIETY 03/09/18  Yes [name], [name], MD  hydrOXYzine (ATARAX/VISTARIL) 25 MG tablet Take 1 tablet (25 mg total) by mouth 3 (three) times daily as needed for itching. 04/21/18  Yes [name], FNP  losartan (COZAAR) 50 MG tablet Take 1 tablet (50 mg total) by mouth 2 (two) times daily. Patient taking differently: Take 50 mg by mouth daily.  02/25/18  Yes Clegg, Amy D, NP  magnesium oxide (MAG-OX) 400 MG tablet Take 400 mg by mouth daily as needed (cramping).   Yes [provider]  metoprolol succinate (TOPROL-XL) 100 MG 24 hr tablet Take 100 mg by mouth daily. Take with or immediately following a meal.   Yes [provider]  NON FORMULARY Bipap, pressure 16/12 with 2L of O2   Yes [provider]  pantoprazole (PROTONIX) 40 MG tablet TAKE 1 TABLET (40 MG TOTAL) BY MOUTH DAILY. 12/30/17  Yes Clegg, Amy D, NP  potassium chloride SA (K-DUR,KLOR-CON) 20 MEQ tablet Take 20 mEq by mouth daily as needed (cramping).   Yes [provider]  sertraline (ZOLOFT) 100 MG tablet Take 1 tablet (100 mg total) by mouth daily. 04/01/18  Yes [name], [name], MD  sildenafil (REVATIO) 20 MG tablet Take 1 tablet (20 mg total) by mouth daily as needed. Patient taking differently: Take 20 mg by mouth daily as needed (ED).  12/30/17  Yes Clegg, Amy D, NP  spironolactone (ALDACTONE) 25 MG tablet Take 1 tablet (25 mg total) by mouth daily. 03/11/18  Yes [name], MD  torsemide (DEMADEX) 20 MG tablet Take 2 tablets (40 mg total) by mouth daily. 03/04/18  Yes [name], MD    pravastatin (PRAVACHOL) 40 MG tablet Take 40 mg by mouth daily.  02/20/12  [provider]    Family History Family History  Problem Relation Age of Onset  . Cancer Mother        brain tumor  . Hypertension Mother   . Diabetes Father        Deceased, 69  . Heart disease Maternal Grandmother   . Hypertension Unknown        Family History  . Stroke Unknown        Family History  . Diabetes Unknown        Family History  . Diabetes Daughter     Social History Social History   Tobacco Use  . Smoking status: Current Every Day Smoker    Packs/day: 0.50    Years: 30.00    Pack years: 15.00    Types: Cigarettes  .  Smokeless tobacco: Never Used  . Tobacco comment: Took information today to call for help with support to quit   Substance Use Topics  . Alcohol use: Yes    Alcohol/week: 0.0 oz    Comment: Reports none in over a month, trying to quit   . Drug use: No     Allergies   Ace inhibitors and Buspirone   Review of Systems Review of Systems All other systems reviewed and are negative except that which was mentioned in HPI   Physical Exam Updated Vital Signs BP 121/80 (BP Location: Right Arm)   Pulse 64   Temp 98.5 F (36.9 C) (Oral)   Resp 16   Ht 5' 8.5" (1.74 m)   Wt (!) 186.4 kg (411 lb)   SpO2 99%   BMI 61.58 kg/m   Physical Exam  Constitutional: He is oriented to person, place, and time. He appears well-developed and well-nourished. No distress.  HENT:  Head: Normocephalic and atraumatic.  Moist mucous membranes  Eyes: Pupils are equal, round, and reactive to light. Scleral icterus is present.  Neck: Neck supple.  Cardiovascular: Normal rate, regular rhythm and normal heart sounds.  No murmur heard. Pulmonary/Chest: Effort normal and breath sounds normal.  Abdominal: Soft. Bowel sounds are normal. He exhibits no distension. There is no hepatomegaly. There is tenderness in the epigastric area. There is no rebound, no guarding and  negative Murphy's sign.  Musculoskeletal: He exhibits edema (mild BLE).  Neurological: He is alert and oriented to person, place, and time.  Fluent speech  Skin: Skin is warm and dry.  Psychiatric: He has a normal mood and affect. Judgment normal.  Nursing note and vitals reviewed.    ED Treatments / Results  Labs (all labs ordered are listed, but only abnormal results are displayed) Labs Reviewed  COMPREHENSIVE METABOLIC PANEL - Abnormal; Notable for the following components:      Result Value   Glucose, Bld 196 (*)    Creatinine, Ser 1.32 (*)    Total Protein 8.2 (*)    AST 96 (*)    ALT 182 (*)    Alkaline Phosphatase 335 (*)    Total Bilirubin 5.0 (*)    All other components within normal limits  LIPASE, BLOOD - Abnormal; Notable for the following components:   Lipase 102 (*)    All other components within normal limits  URINALYSIS, ROUTINE W REFLEX MICROSCOPIC - Abnormal; Notable for the following components:   Color, Urine AMBER (*)    APPearance HAZY (*)    Bilirubin Urine MODERATE (*)    Leukocytes, UA SMALL (*)    All other components within normal limits  CBC    EKG None  Radiology US Abdomen Complete  Result Date: 04/22/2018 CLINICAL DATA:  Elevated liver function tests, nausea and postprandial pain. EXAM: ABDOMEN ULTRASOUND COMPLETE COMPARISON:  None. FINDINGS: Limited assessment due to large body habitus. Gallbladder: Multiple echogenic gallstones measuring to 18 mm with acoustic shadowing. Gallbladder sludge. No gallbladder wall thickening or pericholecystic fluid. No sonographic Murphy's sign elicited. Common bile duct: Diameter: 3 mm Liver: No focal lesion identified. Increased parenchymal echogenicity. Portal vein is patent on color Doppler imaging with normal direction of blood flow towards the liver. IVC: No abnormality visualized. Pancreas: Pancreatic head and included body are normal. Spleen: Limited evaluation, no splenomegaly. Right Kidney: Length:  10.6 cm. Echogenicity within normal limits. No mass or hydronephrosis visualized. Left Kidney: Not sonographically identified. Abdominal aorta: No aneurysm visualized. Other findings: None.  IMPRESSION: 1. Habitus limited examination. 2. Cholelithiasis without sonographic findings of acute cholecystitis. 3. Hepatic steatosis/hepatocellular disease. Electronically Signed   By: Elon Alas M.D.   On: 04/22/2018 04:47    Procedures Procedures (including critical care time)  Medications Ordered in ED Medications - No data to display   Initial Impression / Assessment and Plan / ED Course  I have reviewed the triage vital signs and the nursing notes.  Pertinent labs & imaging results that were available during my care of the patient were reviewed by me and considered in my medical decision making (see chart for details).    PT non-toxic on exam w/ reassuring VS. Epigastric tenderness on exam without RUQ tenderness.  Labs show glucose 196, creatinine 1.3, AST 96, ALT 182, alk phos 335, total bilirubin 5 lipase 102.  Obtained abdominal ultrasound which shows cholelithiasis without findings of cholecystitis. AST/ALT ratio argues against alcoholic hepatitis as cause of lab abnormalities. Concern for possible obstruction given elevated bilirubin and lipase in conjunction w/ epigastric tenderness and gallstones. Discussed admission w/ [facility], Dr. Myna Hidalgo, for GI consultation. Pt admitted for further work up and treatment.   Final Clinical Impressions(s) / ED Diagnoses   Final diagnoses:  Hyperbilirubinemia  Calculus of gallbladder with biliary obstruction but without cholecystitis  AKI (acute kidney injury) [facility])    ED Discharge Orders    None       Saraya Tirey, Wenda Overland, MD 04/22/18 0600

## 2018-04-22 NOTE — H&P (Signed)
History and Physical    [name] ZHG:[id number] DOB: [birth year] DOA: 04/21/2018  PCP: [name], MD   Patient coming from: Home  Chief Complaint: Postprandial N/V, pruritis, dark urine   HPI: [name] is a 52 y.o. 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  Patient reports several weeks of general malaise with poor appetite and post prandial nausea.  This has worsened significantly over the past week, over which interval he is also been experiencing intense pruritus and darkened urine.  He reports some mild tenderness in the epigastrium.  Denies fevers or chills.  Denies any significant alcohol use, and denies use of acetaminophen.  He was evaluated by his PCP for these complaints, blood work was obtained, and he was noted to have abnormal LFTs.  ED Course: Upon arrival to the ED, patient is found to be afebrile, saturating well on room air, bradycardic in the mid 50s, and with stable blood pressure.  Chemistry panel is notable for serum creatinine 1.32, slightly higher than recent priors, glucose 196, alkaline phosphatase 335, AST 96, ALT 182, and total bilirubin 5.0.  CBC is unremarkable.  Abdominal ultrasound was performed and notable for cholelithiasis without acute cholecystitis, without CBD dilation, and with findings consistent with hepatic steatosis.  Hospitalist were asked to admit.   Review of Systems:  All other systems reviewed and apart from HPI, are negative.  Past Medical History:  Diagnosis Date  . Alcohol abuse   . Anxiety state, unspecified   . Atrial fibrillation ([place])   . CHF (congestive heart failure) ([place])   .  Chronic systolic heart failure ([place])   . Diabetes mellitus, type II ([place])   . Edema   . History of medication noncompliance   . Migraine   . Obesity, unspecified   . Obstructive sleep apnea   . Psychiatric disorder   . Pulmonary embolism ([place])   . Shortness of breath     Past Surgical History:  Procedure Laterality Date  . CARDIAC CATHETERIZATION    . CARDIAC CATHETERIZATION N/A 08/13/2015   Procedure: Right/Left Heart Cath and Coronary Angiography;  Surgeon: [name], MD;  Location: [facility];  Service: Cardiovascular;  Laterality: N/A;  . RIGHT/LEFT HEART CATH AND CORONARY ANGIOGRAPHY N/A 04/14/2017   Procedure: Right/Left Heart Cath and Coronary Angiography;  Surgeon: [name], MD;  Location: [facility];  Service: Cardiovascular;  Laterality: N/A;  . TESTICLE SURGERY       reports that he has been smoking cigarettes.  He has a 15.00 pack-year smoking history. He has never used smokeless tobacco. He reports that he drinks alcohol. He reports that he does not use drugs.  Allergies  Allergen Reactions  . Ace Inhibitors Anaphylaxis and Swelling  . Buspirone Other (See Comments)    dizziness    Family History  Problem Relation Age of Onset  . Cancer Mother        brain tumor  . Hypertension Mother   . Diabetes Father        Deceased, 61  . Heart disease Maternal Grandmother   . Hypertension Unknown        Family History  . Stroke Unknown  Family History  . Diabetes Unknown        Family History  . Diabetes Daughter      Prior to Admission medications   Medication Sig Start Date End Date Taking? Authorizing Provider  allopurinol (ZYLOPRIM) 100 MG tablet Take 1 tablet (100 mg total) by mouth daily. Patient taking differently: Take 100 mg by mouth daily as needed (gout).  02/03/18  Yes [name], MD  ammonium lactate (LAC-HYDRIN) 12 % cream Apply topically as needed for dry skin. Patient taking differently: Apply 1 g topically  daily as needed for dry skin.  04/21/18  Yes [name], FNP  aspirin EC 81 MG tablet Take 1 tablet (81 mg total) by mouth daily. 08/01/15  Yes [name], MD  clonazePAM (KLONOPIN) 1 MG tablet TAKE 1 TABLET BY MOUTH 3 TIMES DAILY AS NEEDED FOR ANXIETY Patient taking differently: Take 1 mg by mouth 2 (two) times daily. TAKE 1 TABLET BY MOUTH 2 TIMES DAILY CAN TAKE ANOTHER TABLET AS NEEDED FOR ANXIETY 03/09/18  Yes [name], [name], MD  hydrOXYzine (ATARAX/VISTARIL) 25 MG tablet Take 1 tablet (25 mg total) by mouth 3 (three) times daily as needed for itching. 04/21/18  Yes [name], FNP  losartan (COZAAR) 50 MG tablet Take 1 tablet (50 mg total) by mouth 2 (two) times daily. Patient taking differently: Take 50 mg by mouth daily.  02/25/18  Yes Clegg, Amy D, NP  magnesium oxide (MAG-OX) 400 MG tablet Take 400 mg by mouth daily as needed (cramping).   Yes [provider]  metoprolol succinate (TOPROL-XL) 100 MG 24 hr tablet Take 100 mg by mouth daily. Take with or immediately following a meal.   Yes [provider]  NON FORMULARY Bipap, pressure 16/12 with 2L of O2   Yes [provider]  pantoprazole (PROTONIX) 40 MG tablet TAKE 1 TABLET (40 MG TOTAL) BY MOUTH DAILY. 12/30/17  Yes Clegg, Amy D, NP  potassium chloride SA (K-DUR,KLOR-CON) 20 MEQ tablet Take 20 mEq by mouth daily as needed (cramping).   Yes [provider]  sertraline (ZOLOFT) 100 MG tablet Take 1 tablet (100 mg total) by mouth daily. 04/01/18  Yes [name], [name], MD  sildenafil (REVATIO) 20 MG tablet Take 1 tablet (20 mg total) by mouth daily as needed. Patient taking differently: Take 20 mg by mouth daily as needed (ED).  12/30/17  Yes Clegg, Amy D, NP  spironolactone (ALDACTONE) 25 MG tablet Take 1 tablet (25 mg total) by mouth daily. 03/11/18  Yes [name], MD  torsemide (DEMADEX) 20 MG tablet Take 2 tablets (40 mg total) by mouth daily. 03/04/18  Yes [name], MD  pravastatin (PRAVACHOL) 40 MG tablet Take 40 mg by mouth daily.  02/20/12  [provider]    Physical Exam: Vitals:   04/22/18 0400 04/22/18 0415 04/22/18 0430 04/22/18 0540  BP: 119/78 116/85 115/84 121/80  Pulse: (!) 59 61 (!) 53 64  Resp: _0 Temp:      TempSrc:      SpO2: 92% 100% 98% 99%  Weight:      Height:          Constitutional: NAD, calm, obese Eyes: PERTLA, lids and conjunctivae normal ENMT: Mucous membranes are moist. Posterior pharynx clear of any exudate or lesions.   Neck: normal, supple, no masses, no thyromegaly Respiratory: clear to auscultation bilaterally, no wheezing, no crackles. Normal respiratory effort.  Cardiovascular: S1 & S2 heard, regular rate and rhythm. No extremity edema.   Abdomen: No distension, soft, tender in epigastrium and RUQ, no rebound pain or guarding. Bowel sounds active.  Musculoskeletal: no clubbing / cyanosis. No joint deformity upper and lower extremities.    Skin: no significant rashes, lesions, ulcers. Warm, dry, well-perfused. Neurologic: Dysconjugate gaze, CN II-XII intact otherwise. Sensation intact. Strength 5/5 in all 4 limbs.  Psychiatric: Alert and oriented x 3. Calm, cooperative.     Labs on Admission: I have personally reviewed following labs and imaging studies  CBC: Recent Labs  Lab 04/21/18 1150 04/22/18 0242  WBC 6.9 9.6  NEUTROABS 2.7  --   HGB 15.4 15.0  HCT 43.8 42.7  MCV 85.7 81.6  PLT 280.0 665   Basic Metabolic Panel: Recent Labs  Lab 04/21/18 1150 04/22/18 0242  NA 138 140  K 3.6 3.5  CL 102 102  CO2 24 27  GLUCOSE 230* 196*  BUN 15 15  CREATININE 1.06 1.32*  CALCIUM 9.6 9.4   GFR: Estimated Creatinine Clearance: 111.4 mL/min (A) (by C-G formula based on SCr of 1.32 mg/dL (H)). Liver Function Tests: Recent Labs  Lab 04/21/18 1150 04/22/18 0242  AST 84* 96*  ALT 178* 182*  ALKPHOS 356* 335*  BILITOT 4.6* 5.0*  PROT 8.1 8.2*  ALBUMIN 4.1 3.8    Recent Labs  Lab 04/22/18 0242  LIPASE 102*   No results for input(s): AMMONIA in the last 168 hours. Coagulation Profile: No results for input(s): INR, PROTIME in the last 168 hours. Cardiac Enzymes: No results for input(s): CKTOTAL, CKMB, CKMBINDEX, TROPONINI in the last 168 hours. BNP (last 3 results) No results for input(s): PROBNP in the last 8760 hours. HbA1C: No results for input(s): HGBA1C in the last 72 hours. CBG: No results for input(s): GLUCAP in the last 168 hours. Lipid Profile: No results for input(s): CHOL, HDL, LDLCALC, TRIG, CHOLHDL, LDLDIRECT in the last 72 hours. Thyroid Function Tests: No results for input(s): TSH, T4TOTAL, FREET4, T3FREE, THYROIDAB in the last 72 hours. Anemia Panel: Recent Labs    04/21/18 1150  VITAMINB12 1,198*   Urine analysis:    Component Value Date/Time   COLORURINE AMBER (A) 04/22/2018 0345   APPEARANCEUR HAZY (A) 04/22/2018 0345   LABSPEC 1.015 04/22/2018 0345   PHURINE 5.0 04/22/2018 0345   GLUCOSEU NEGATIVE 04/22/2018 0345   GLUCOSEU NEGATIVE 04/21/2018 1150   HGBUR NEGATIVE 04/22/2018 0345   BILIRUBINUR MODERATE (A) 04/22/2018 0345   KETONESUR NEGATIVE 04/22/2018 0345   PROTEINUR NEGATIVE 04/22/2018 0345   UROBILINOGEN 1.0 04/21/2018 1150   NITRITE NEGATIVE 04/22/2018 0345   LEUKOCYTESUR SMALL (A) 04/22/2018 0345   Sepsis Labs: _0 (procalcitonin:4,lacticidven:4) )No results found for this or any previous visit (from the past 240 hour(s)).   Radiological Exams on Admission: US Abdomen Complete  Result Date: 04/22/2018 CLINICAL DATA:  Elevated liver function tests, nausea and postprandial pain. EXAM: ABDOMEN ULTRASOUND COMPLETE COMPARISON:  None. FINDINGS: Limited assessment due to large body habitus. Gallbladder: Multiple echogenic gallstones measuring to 18 mm with acoustic shadowing. Gallbladder sludge. No gallbladder wall thickening or pericholecystic fluid. No sonographic Murphy's sign elicited. Common  bile duct: Diameter: 3 mm Liver: No focal lesion identified. Increased parenchymal echogenicity. Portal vein is patent on color Doppler imaging with normal direction of blood flow towards the liver. IVC: No abnormality visualized. Pancreas: Pancreatic head and included body are normal. Spleen: Limited evaluation, no splenomegaly. Right Kidney: Length: 10.6 cm. Echogenicity within normal limits. No mass  or hydronephrosis visualized. Left Kidney: Not sonographically identified. Abdominal aorta: No aneurysm visualized. Other findings: None. IMPRESSION: 1. Habitus limited examination. 2. Cholelithiasis without sonographic findings of acute cholecystitis. 3. Hepatic steatosis/hepatocellular disease. Electronically Signed   By: Elon Alas M.D.   On: 04/22/2018 04:47    EKG: Not performed.   Assessment/Plan   1. Jaundice   - Presents with several weeks of postprandial nausea and more recent pruritis, dark urine, and N/V  - Found to have alk phos 335, AST 96, ALT 182, and total bili 5.0  - Abdomen mildly tender in epigastrium; no fever or leukocytosis  - Abd [place] with cholelithiasis, but no evidence for cholecystitis and no CBD dilation  - Plan to check INR and fractionate bilirubin as next step in eval  - Continue supportive care   2. Chronic combined systolic & diastolic CHF  - Appears well-compensated  - EF only 15% on TTE from one yr ago, follows with cardiology, refused ICD  - Hold diuretics initially given recent anorexia and vomiting  - Follow daily wt and I/O's, SLIV, continue beta-blocker as tolerated and hold losartan in light of increased creatinine    3. Hypertension  - BP at goal  - Continue Toprol as tolerated, hold losartan in light increased creatinine    4. Type II DM  - A1c was 7.6% in September  - Diet-controlled currently  - Follow CBG's and use SSI with Novolog as needed    5. Generalized anxiety disorder  - Stable, continue Klonopin with dose-reduction and hold  Zoloft in light of #1    6. OSA  - Continue BiPAP qHS     DVT prophylaxis: Lovenox Code Status: Full  Family Communication: Discussed with patient Consults called: None Admission status: Inpatient    [name], MD [facility] Pager [phone]  If 7PM-7AM, please contact night-coverage [email or web address] Password [facility]  04/22/2018, 6:04 AM

## 2018-04-22 NOTE — Consult Note (Addendum)
[facility] Surgery Consult Note  [name] 10/05/1966  [id number].    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 Patient states that 2 weeks ago he started noticing the itching. He was constipated for several days and then yesterday he started having diarrhea. Denies fever or chills. States that he really hasn't had much abdominal pain, but he noticed a suppressed appetite and if he did eat he would get full quickly. Reports 1 episode of n/v. States that he has never felt like this before. Went to PCP who did blood work and advised that he go to the ED due to abnormal LFTs.  Lab work revealed AST 96, ALT 182, alk phos 335, bilirubin 5.0, lipase 102, WBC 9.6, creatinine 1.32. U/s showed gallstones without signs of cholecystitis. MRCP shows 1.1 cm stone but no choledocholithiasis. General surgery asked to see.  Abdominal surgical history: none ECHO 03/31/17: EF 15% Anticoagulants: none Smokes 1/2 to 1 PPD Employment: DJ  ROS: Review of Systems  Constitutional: Positive for malaise/fatigue.  HENT: Negative.   Eyes: Negative.   Cardiovascular: Negative.   Gastrointestinal: Positive for abdominal pain, constipation, diarrhea, nausea and vomiting.  Genitourinary: Negative.   Musculoskeletal: Negative.   Skin: Positive for itching.  Neurological: Negative.    All systems reviewed and otherwise negative except for as above  Family History  Problem Relation Age of Onset  . Cancer Mother        brain tumor  . Hypertension Mother   . Diabetes Father        Deceased, 15  . Heart disease Maternal Grandmother   .  Hypertension Unknown        Family History  . Stroke Unknown        Family History  . Diabetes Unknown        Family History  . Diabetes Daughter     Past Medical History:  Diagnosis Date  . Alcohol abuse   . Anxiety state, unspecified   . Atrial fibrillation ([place])   . CHF (congestive heart failure) ([place])   . Chronic systolic heart failure ([place])   . Diabetes mellitus, type II ([place])   . Edema   . History of medication noncompliance   . Migraine   . Obesity, unspecified   . Obstructive sleep apnea   . Psychiatric disorder   . Pulmonary embolism ([place])   . Shortness of breath     Past Surgical History:  Procedure Laterality Date  . CARDIAC CATHETERIZATION    . CARDIAC CATHETERIZATION N/A 08/13/2015   Procedure: Right/Left Heart Cath and Coronary Angiography;  Surgeon: [name], MD;  Location: [facility];  Service: Cardiovascular;  Laterality: N/A;  . RIGHT/LEFT HEART CATH AND CORONARY ANGIOGRAPHY N/A 04/14/2017   Procedure: Right/Left Heart Cath and Coronary Angiography;  Surgeon: [name], MD;  Location: [facility];  Service: Cardiovascular;  Laterality: N/A;  . TESTICLE SURGERY      Social History:  reports that he has been smoking cigarettes.  He has a 15.00 pack-year smoking history. He has never used smokeless tobacco. He reports that he drinks alcohol. He reports that he does  not use drugs.  Allergies:  Allergies  Allergen Reactions  . Ace Inhibitors Anaphylaxis and Swelling  . Buspirone Other (See Comments)    dizziness     (Not in a hospital admission)  Prior to Admission medications   Medication Sig Start Date End Date Taking? Authorizing Provider  allopurinol (ZYLOPRIM) 100 MG tablet Take 1 tablet (100 mg total) by mouth daily. Patient taking differently: Take 100 mg by mouth daily as needed (gout).  02/03/18  Yes [name], MD  ammonium lactate (LAC-HYDRIN) 12 % cream Apply topically as needed for dry skin. Patient taking  differently: Apply 1 g topically daily as needed for dry skin.  04/21/18  Yes [name], FNP  aspirin EC 81 MG tablet Take 1 tablet (81 mg total) by mouth daily. 08/01/15  Yes [name], MD  clonazePAM (KLONOPIN) 1 MG tablet TAKE 1 TABLET BY MOUTH 3 TIMES DAILY AS NEEDED FOR ANXIETY Patient taking differently: Take 1 mg by mouth 2 (two) times daily. TAKE 1 TABLET BY MOUTH 2 TIMES DAILY CAN TAKE ANOTHER TABLET AS NEEDED FOR ANXIETY 03/09/18  Yes [name], [name], MD  hydrOXYzine (ATARAX/VISTARIL) 25 MG tablet Take 1 tablet (25 mg total) by mouth 3 (three) times daily as needed for itching. 04/21/18  Yes [name], FNP  losartan (COZAAR) 50 MG tablet Take 1 tablet (50 mg total) by mouth 2 (two) times daily. Patient taking differently: Take 50 mg by mouth daily.  02/25/18  Yes Clegg, Amy D, NP  magnesium oxide (MAG-OX) 400 MG tablet Take 400 mg by mouth daily as needed (cramping).   Yes [provider]  metoprolol succinate (TOPROL-XL) 100 MG 24 hr tablet Take 100 mg by mouth daily. Take with or immediately following a meal.   Yes [provider]  NON FORMULARY Bipap, pressure 16/12 with 2L of O2   Yes [provider]  pantoprazole (PROTONIX) 40 MG tablet TAKE 1 TABLET (40 MG TOTAL) BY MOUTH DAILY. 12/30/17  Yes Clegg, Amy D, NP  potassium chloride SA (K-DUR,KLOR-CON) 20 MEQ tablet Take 20 mEq by mouth daily as needed (cramping).   Yes [provider]  sertraline (ZOLOFT) 100 MG tablet Take 1 tablet (100 mg total) by mouth daily. 04/01/18  Yes [name], [name], MD  sildenafil (REVATIO) 20 MG tablet Take 1 tablet (20 mg total) by mouth daily as needed. Patient taking differently: Take 20 mg by mouth daily as needed (ED).  12/30/17  Yes Clegg, Amy D, NP  spironolactone (ALDACTONE) 25 MG tablet Take 1 tablet (25 mg total) by mouth daily. 03/11/18  Yes [name], MD  torsemide (DEMADEX) 20 MG tablet Take 2 tablets (40 mg total) by  mouth daily. 03/04/18  Yes [name], MD  pravastatin (PRAVACHOL) 40 MG tablet Take 40 mg by mouth daily.  02/20/12  [provider]    Blood pressure 113/70, pulse 61, temperature 98.7 F (37.1 C), temperature source Oral, resp. rate 18, height 5' 8.5" (1.74 m), weight (!) 186.4 kg (411 lb), SpO2 96 %. Physical Exam: General: obese AA male who is laying in bed in NAD HEENT: head is normocephalic, atraumatic.  +scleral icterus.  Pupils equal and round.  Ears and nose without any masses or lesions.  Mouth is pink and moist. Dentition fair Heart: regular, rate, and rhythm.  No obvious murmurs, gallops, or rubs noted.  Palpable pedal pulses bilaterally Lungs: CTAB, no wheezes, rhonchi, or rales noted.  Respiratory effort  nonlabored Abd: obese, soft, nondistended, mild TTP RUQ/RLQ without rebound or guarding, +BS, no masses, hernias, or organomegaly MS: all 4 extremities are symmetrical with no cyanosis, clubbing, or edema. Skin: warm and dry with no masses, lesions, or rashes Psych: A&Ox3 with an appropriate affect. Neuro: cranial nerves grossly intact, extremity CSM intact bilaterally, normal speech  Results for orders placed or performed during the hospital encounter of 04/21/18 (from the past 48 hour(s))  CBC     Status: None   Collection Time: 04/22/18  2:42 AM  Result Value Ref Range   WBC 9.6 4.0 - 10.5 K/uL   RBC 5.23 4.22 - 5.81 MIL/uL   Hemoglobin 15.0 13.0 - 17.0 g/dL   HCT 42.7 39.0 - 52.0 %   MCV 81.6 78.0 - 100.0 fL   MCH 28.7 26.0 - 34.0 pg   MCHC 35.1 30.0 - 36.0 g/dL   RDW 14.6 11.5 - 15.5 %   Platelets 285 150 - 400 K/uL    Comment: Performed at [facility], [place] [address] [place], [place] [address]  Comprehensive metabolic panel     Status: Abnormal   Collection Time: 04/22/18  2:42 AM  Result Value Ref Range   Sodium 140 135 - 145 mmol/L   Potassium 3.5 3.5 - 5.1 mmol/L   Chloride 102 101 - 111 mmol/L   CO2 27 22 - 32 mmol/L   Glucose, Bld  196 (H) 65 - 99 mg/dL   BUN 15 6 - 20 mg/dL   Creatinine, Ser 1.32 (H) 0.61 - 1.24 mg/dL   Calcium 9.4 8.9 - 10.3 mg/dL   Total Protein 8.2 (H) 6.5 - 8.1 g/dL   Albumin 3.8 3.5 - 5.0 g/dL   AST 96 (H) 15 - 41 U/L   ALT 182 (H) 17 - 63 U/L   Alkaline Phosphatase 335 (H) 38 - 126 U/L   Total Bilirubin 5.0 (H) 0.3 - 1.2 mg/dL   GFR calc non Af Amer >60 >60 mL/min   GFR calc Af Amer >60 >60 mL/min    Comment: (NOTE) The eGFR has been calculated using the CKD EPI equation. This calculation has not been validated in all clinical situations. eGFR's persistently <60 mL/min signify possible Chronic Kidney Disease.    Anion gap 11 5 - 15    Comment: Performed at [facility] [address] [place], [place] [address]  Lipase, blood     Status: Abnormal   Collection Time: 04/22/18  2:42 AM  Result Value Ref Range   Lipase 102 (H) 11 - 51 U/L    Comment: Performed at [facility], [place] [address], [place], [place] [address]  Urinalysis, Routine w reflex microscopic     Status: Abnormal   Collection Time: 04/22/18  3:45 AM  Result Value Ref Range   Color, Urine AMBER (A) YELLOW    Comment: BIOCHEMICALS MAY BE AFFECTED BY COLOR   APPearance HAZY (A) CLEAR   Specific Gravity, Urine 1.015 1.005 - 1.030   pH 5.0 5.0 - 8.0   Glucose, UA NEGATIVE NEGATIVE mg/dL   Hgb urine dipstick NEGATIVE NEGATIVE   Bilirubin Urine MODERATE (A) NEGATIVE   Ketones, ur NEGATIVE NEGATIVE mg/dL   Protein, ur NEGATIVE NEGATIVE mg/dL   Nitrite NEGATIVE NEGATIVE   Leukocytes, UA SMALL (A) NEGATIVE   RBC / HPF 0-5 0 - 5 RBC/hpf   WBC, UA 21-50 0 - 5 WBC/hpf   Bacteria, UA NONE SEEN NONE SEEN  Squamous Epithelial / LPF 0-5 0 - 5   Mucus PRESENT    Hyaline Casts, UA PRESENT     Comment: Performed at [facility], [place] [address] [place], [place] [address]  Protime-INR     Status: None   Collection Time: 04/22/18  6:23 AM  Result Value Ref Range   Prothrombin Time 13.1 11.4 - 15.2 seconds    INR 1.00     Comment: Performed at [facility] [address], [place], [place] [address]  Bilirubin, fractionated(tot/dir/indir)     Status: Abnormal   Collection Time: 04/22/18  6:23 AM  Result Value Ref Range   Total Bilirubin 4.9 (H) 0.3 - 1.2 mg/dL   Bilirubin, Direct 2.5 (H) 0.1 - 0.5 mg/dL   Indirect Bilirubin 2.4 (H) 0.3 - 0.9 mg/dL    Comment: Performed at [facility] [address], [place], [place] [address]  CBG monitoring, ED     Status: Abnormal   Collection Time: 04/22/18  7:15 AM  Result Value Ref Range   Glucose-Capillary 190 (H) 65 - 99 mg/dL  CBG monitoring, ED     Status: Abnormal   Collection Time: 04/22/18 11:35 AM  Result Value Ref Range   Glucose-Capillary 190 (H) 65 - 99 mg/dL  CBG monitoring, ED     Status: Abnormal   Collection Time: 04/22/18  1:13 PM  Result Value Ref Range   Glucose-Capillary 201 (H) 65 - 99 mg/dL   US Abdomen Complete  Result Date: 04/22/2018 CLINICAL DATA:  Elevated liver function tests, nausea and postprandial pain. EXAM: ABDOMEN ULTRASOUND COMPLETE COMPARISON:  None. FINDINGS: Limited assessment due to large body habitus. Gallbladder: Multiple echogenic gallstones measuring to 18 mm with acoustic shadowing. Gallbladder sludge. No gallbladder wall thickening or pericholecystic fluid. No sonographic Murphy's sign elicited. Common bile duct: Diameter: 3 mm Liver: No focal lesion identified. Increased parenchymal echogenicity. Portal vein is patent on color Doppler imaging with normal direction of blood flow towards the liver. IVC: No abnormality visualized. Pancreas: Pancreatic head and included body are normal. Spleen: Limited evaluation, no splenomegaly. Right Kidney: Length: 10.6 cm. Echogenicity within normal limits. No mass or hydronephrosis visualized. Left Kidney: Not sonographically identified. Abdominal aorta: No aneurysm visualized. Other findings: None. IMPRESSION: 1. Habitus limited examination. 2. Cholelithiasis  without sonographic findings of acute cholecystitis. 3. Hepatic steatosis/hepatocellular disease. Electronically Signed   By: Elon Alas M.D.   On: 04/22/2018 04:47   Mr 3d Recon At Scanner  Result Date: 04/22/2018 CLINICAL DATA:  Postprandial nausea and vomiting. Congestive heart failure. Poor appetite. Vomiting. Dark urine and abnormal blood work. Malaise. Abnormal elevated liver function tests. Cholelithiasis. Hepatic steatosis suspected on ultrasound. EXAM: MRI ABDOMEN WITHOUT AND WITH CONTRAST (INCLUDING MRCP) TECHNIQUE: Multiplanar multisequence MR imaging of the abdomen was performed both before and after the administration of intravenous contrast. Heavily T2-weighted images of the biliary and pancreatic ducts were obtained, and three-dimensional MRCP images were rendered by post processing. CONTRAST:  54m MULTIHANCE GADOBENATE DIMEGLUMINE 529 MG/ML IV SOLN COMPARISON:  Multiple exams, including 04/22/2018 and 06/12/2015 FINDINGS: Despite efforts by the technologist and patient, motion artifact is present on today's exam and could not be eliminated. This reduces exam sensitivity and specificity. Body habitus reduces diagnostic sensitivity and specificity. Lower chest: Moderate cardiomegaly. Hepatobiliary: 1.1 cm gallstone in the gallbladder. No appreciable degree of steatosis. No focal abnormal hepatic lesion. No biliary dilatation or significant irregularity or normal enhancement along the biliary tree. Pancreas:  Unremarkable Spleen:  Unremarkable Adrenals/Urinary Tract: 1.5 by 1.7 cm nodule of the left adrenal gland appears to demonstrates signal dropout on out of phase images favoring adenoma. This is not appreciably changed in size from 06/12/2015. Several tiny T2 hyperintense renal lesions are likely cysts but technically too small to characterize given the degree of motion artifact. Stomach/Bowel: Unremarkable Vascular/Lymphatic:  Unremarkable Other:  No supplemental non-categorized  findings. Musculoskeletal: Unremarkable IMPRESSION: 1. There is 1.1 cm gallstone in the gallbladder, but no appreciable hepatic steatosis or focal hepatic lesion to further explain the patient's abnormal liver enzymes. 2. Small chronically stable left adrenal nodule has an appearance favoring adrenal adenoma. 3. Cardiomegaly. Electronically Signed   By: Van Clines M.D.   On: 04/22/2018 15:16   Mr Abdomen Mrcp Moise Boring Contast  Result Date: 04/22/2018 CLINICAL DATA:  Postprandial nausea and vomiting. Congestive heart failure. Poor appetite. Vomiting. Dark urine and abnormal blood work. Malaise. Abnormal elevated liver function tests. Cholelithiasis. Hepatic steatosis suspected on ultrasound. EXAM: MRI ABDOMEN WITHOUT AND WITH CONTRAST (INCLUDING MRCP) TECHNIQUE: Multiplanar multisequence MR imaging of the abdomen was performed both before and after the administration of intravenous contrast. Heavily T2-weighted images of the biliary and pancreatic ducts were obtained, and three-dimensional MRCP images were rendered by post processing. CONTRAST:  45m MULTIHANCE GADOBENATE DIMEGLUMINE 529 MG/ML IV SOLN COMPARISON:  Multiple exams, including 04/22/2018 and 06/12/2015 FINDINGS: Despite efforts by the technologist and patient, motion artifact is present on today's exam and could not be eliminated. This reduces exam sensitivity and specificity. Body habitus reduces diagnostic sensitivity and specificity. Lower chest: Moderate cardiomegaly. Hepatobiliary: 1.1 cm gallstone in the gallbladder. No appreciable degree of steatosis. No focal abnormal hepatic lesion. No biliary dilatation or significant irregularity or normal enhancement along the biliary tree. Pancreas:  Unremarkable Spleen:  Unremarkable Adrenals/Urinary Tract: 1.5 by 1.7 cm nodule of the left adrenal gland appears to demonstrates signal dropout on out of phase images favoring adenoma. This is not appreciably changed in size from 06/12/2015. Several  tiny T2 hyperintense renal lesions are likely cysts but technically too small to characterize given the degree of motion artifact. Stomach/Bowel: Unremarkable Vascular/Lymphatic:  Unremarkable Other:  No supplemental non-categorized findings. Musculoskeletal: Unremarkable IMPRESSION: 1. There is 1.1 cm gallstone in the gallbladder, but no appreciable hepatic steatosis or focal hepatic lesion to further explain the patient's abnormal liver enzymes. 2. Small chronically stable left adrenal nodule has an appearance favoring adrenal adenoma. 3. Cardiomegaly. Electronically Signed   By: WVan ClinesM.D.   On: 04/22/2018 15:16    Anti-infectives (From admission, onward)   None      Assessment/Plan Obesity (BMI 62) Chronic combined CHF - ECHO 03/31/17: EF 15% - cardiologist Dr. MMarigene EhlersDM HTN H/o PE - no longer on anticoagulation Generalized anxiety disorder AKI - creatinine 1.32  Elevated bilirubin Cholelithiasis ?Choledocholithiasis - AST 96, ALT 182, alk phos 335, bilirubin 5.0, lipase 102 - WBC 9.6, afebril - U/s showed gallstones without signs of cholecystitis - MRCP shows 1.1 cm stone but no choledocholithiasis  ID - none VTE - SCDs, lovenox FEN - IVF, CLD, NPO after MN Foley - none Follow up - TBD  Plan - Patient with cholelithiasis, elevated LFTs, and bilirubin 5 with jaundice. Could have choledocholithiasis, but also concerned that he may have a liver process causing LFT abnormalities. GI to see. Admit to medicine due to multiple comorbidities. I have called cardiology for consult/surgical clearance. [place] today. NPO after midnight. Repeat labs in AM. Will follow.  Michae Grimley  Callie Fielding, [facility] Surgery 04/22/2018, 4:04 PM Pager: [phone] Consults: [phone] Mon-Fri 7:00 am-4:30 pm Sat-Sun 7:00 am-11:30 am

## 2018-04-23 DIAGNOSIS — R740 Nonspecific elevation of levels of transaminase and lactic acid dehydrogenase [LDH]: Secondary | ICD-10-CM

## 2018-04-23 LAB — LIPASE, BLOOD: LIPASE: 90 U/L — AB (ref 11–51)

## 2018-04-23 LAB — BASIC METABOLIC PANEL
Anion gap: 12 (ref 5–15)
BUN: 19 mg/dL (ref 6–20)
CHLORIDE: 102 mmol/L (ref 101–111)
CO2: 25 mmol/L (ref 22–32)
Calcium: 9.1 mg/dL (ref 8.9–10.3)
Creatinine, Ser: 1.22 mg/dL (ref 0.61–1.24)
Glucose, Bld: 201 mg/dL — ABNORMAL HIGH (ref 65–99)
POTASSIUM: 3.3 mmol/L — AB (ref 3.5–5.1)
SODIUM: 139 mmol/L (ref 135–145)

## 2018-04-23 LAB — GLUCOSE, CAPILLARY
GLUCOSE-CAPILLARY: 191 mg/dL — AB (ref 65–99)
GLUCOSE-CAPILLARY: 204 mg/dL — AB (ref 65–99)
Glucose-Capillary: 183 mg/dL — ABNORMAL HIGH (ref 65–99)
Glucose-Capillary: 186 mg/dL — ABNORMAL HIGH (ref 65–99)
Glucose-Capillary: 192 mg/dL — ABNORMAL HIGH (ref 65–99)

## 2018-04-23 LAB — HEPATIC FUNCTION PANEL
ALT: 137 U/L — ABNORMAL HIGH (ref 17–63)
AST: 69 U/L — ABNORMAL HIGH (ref 15–41)
Albumin: 3.2 g/dL — ABNORMAL LOW (ref 3.5–5.0)
Alkaline Phosphatase: 274 U/L — ABNORMAL HIGH (ref 38–126)
Bilirubin, Direct: 2.7 mg/dL — ABNORMAL HIGH (ref 0.1–0.5)
Indirect Bilirubin: 2 mg/dL — ABNORMAL HIGH (ref 0.3–0.9)
TOTAL PROTEIN: 7 g/dL (ref 6.5–8.1)
Total Bilirubin: 4.7 mg/dL — ABNORMAL HIGH (ref 0.3–1.2)

## 2018-04-23 LAB — HEPATITIS PANEL, ACUTE
HEP A IGM: NEGATIVE
HEP B C IGM: NEGATIVE
HEP B S AG: NEGATIVE

## 2018-04-23 LAB — CBC
HCT: 38.5 % — ABNORMAL LOW (ref 39.0–52.0)
HEMOGLOBIN: 13.7 g/dL (ref 13.0–17.0)
MCH: 29.1 pg (ref 26.0–34.0)
MCHC: 35.6 g/dL (ref 30.0–36.0)
MCV: 81.7 fL (ref 78.0–100.0)
PLATELETS: 254 10*3/uL (ref 150–400)
RBC: 4.71 MIL/uL (ref 4.22–5.81)
RDW: 14.3 % (ref 11.5–15.5)
WBC: 6.7 10*3/uL (ref 4.0–10.5)

## 2018-04-23 LAB — HIV ANTIBODY (ROUTINE TESTING W REFLEX): HIV SCREEN 4TH GENERATION: NONREACTIVE

## 2018-04-23 MED ORDER — TORSEMIDE 20 MG PO TABS
40.0000 mg | ORAL_TABLET | Freq: Every day | ORAL | Status: DC
  Administered 2018-04-24: 40 mg via ORAL
  Filled 2018-04-23: qty 2

## 2018-04-23 MED ORDER — LOSARTAN POTASSIUM 50 MG PO TABS
50.0000 mg | ORAL_TABLET | Freq: Two times a day (BID) | ORAL | Status: DC
  Administered 2018-04-23 – 2018-04-24 (×2): 50 mg via ORAL
  Filled 2018-04-23 (×2): qty 1

## 2018-04-23 NOTE — Progress Notes (Signed)
CC: Nausea, vomiting, itching and headache for 2 weeks; elevated LFT's  Subjective: He still feels bad, no abdominal pain. Has not had abdominal pain.    Objective: Vital signs in last 24 hours: Temp:  [97.7 F (36.5 C)-98.7 F (37.1 C)] 97.7 F (36.5 C) (05/24 0450) Pulse Rate:  [52-94] 67 (05/24 0450) Resp:  [14-20] 20 (05/23 2259) BP: (76-146)/(57-96) 120/82 (05/24 0450) SpO2:  [93 %-100 %] 96 % (05/24 0450) Weight:  [185.7 kg (409 lb 5 oz)-187.3 kg (413 lb)] 187.3 kg (413 lb) (05/24 0500) Last BM Date: 04/21/18 PO 240 Urine 200 Afebrile, VSS LFT's stable, Bil still up to 4.7 Normal CBC WBC 6.7 [place]:  Multiple echogenic gallstones measuring to 18 mm with acoustic shadowing. Gallbladder sludge. No gallbladder wall thickening or pericholecystic fluid. No sonographic Murphy's sign Elicited.CBD is normal MRI:  There is 1.1 cm gallstone in the gallbladder, but no appreciable hepatic steatosis or focal hepatic lesion to further explain the patient's abnormal liver enzymes. 2. Small chronically stable left adrenal nodule has an appearance favoring adrenal adenoma. 3. Cardiomegaly   Intake/Output from previous day: 05/23 0701 - 05/24 0700 In: 243 [P.O.:240; I.V.:3] Out: 200 [Urine:200] Intake/Output this shift: No intake/output data recorded.  General appearance: alert, cooperative and no distress GI: soft, non-tender; bowel sounds normal; no masses,  no organomegaly  Lab Results:  Recent Labs    04/22/18 0242 04/23/18 0428  WBC 9.6 6.7  HGB 15.0 13.7  HCT 42.7 38.5*  PLT 285 254    BMET Recent Labs    04/22/18 0242 04/23/18 0428  NA 140 139  K 3.5 3.3*  CL 102 102  CO2 27 25  GLUCOSE 196* 201*  BUN 15 19  CREATININE 1.32* 1.22  CALCIUM 9.4 9.1   PT/INR Recent Labs    04/22/18 0623  LABPROT 13.1  INR 1.00    Recent Labs  Lab 04/21/18 1150 04/22/18 0242 04/22/18 0623 04/23/18 0428  AST 84* 96*  --  69*  ALT 178* 182*  --  137*   ALKPHOS 356* 335*  --  274*  BILITOT 4.6* 5.0* 4.9* 4.7*  PROT 8.1 8.2*  --  7.0  ALBUMIN 4.1 3.8  --  3.2*     Lipase     Component Value Date/Time   LIPASE 90 (H) 04/23/2018 0428     Medications: . clonazePAM  0.5 mg Oral BID  . enoxaparin (LOVENOX) injection  90 mg Subcutaneous Daily  . insulin aspart  0-9 Units Subcutaneous Q4H  . metoprolol succinate  100 mg Oral Daily  . nicotine  21 mg Transdermal Daily  . sodium chloride flush  3 mL Intravenous Q12H    Assessment/Plan Obesity (BMI 62) Chronic combined CHF - ECHO 03/31/17: EF 15% - cardiologist Dr. Marigene Ehlers DM HTN H/o PE - no longer on anticoagulation Generalized anxiety disorder AKI - creatinine 1.32  Elevated bilirubin Cholelithiasis ?Choledocholithiasis - AST 96, ALT 182, alk phos 335, bilirubin 5.0, lipase 102 - WBC 9.6, afebril - U/s showed gallstones without signs of cholecystitis - MRCP shows 1.1 cm stone but no choledocholithiasis  ID - none VTE - SCDs, lovenox FEN - IVF, CLD, NPO after MN Foley - none Follow up - TBD   Plan:  Reviewed with Dr. Watt Climes last PM and he thinks the issue is not his gallbladder at all.  He is working him up for other liver processes that may cause the LFT elevation. No signs of cholecystitis.  He is also  a very poor candidate for surgery.  We will follow with you but he has no surgical needs currently.         LOS: 1 day    Ramaj Frangos 04/23/2018 [phone]

## 2018-04-23 NOTE — Progress Notes (Signed)
[name] 10:02 AM  Subjective: Patient doing fine from a GI standpoint and tolerated regular food last night and his itching is controlled on his medicines and he has no new complaints and case discussed with the surgical team  Objective: Vital signs stable afebrile no acute distress abdomen is soft nontender  Assessment: Abnormal liver tests questionably medicine-induced in patient with gallstones LFTs slightly lower  Plan: Await serologies probably could go home soon and follow-up when results are back with close outpatient follow-up  [facility]  Pager [phone] After 5PM or if no answer call [phone]

## 2018-04-23 NOTE — Progress Notes (Addendum)
Advanced Heart Failure Rounding Note  PCP-Cardiologist: [name], MD   Subjective:    [place] 04/22/18:  Multiple echogenic gallstones measuring to 18 mm with acoustic shadowing. Gallbladder sludge. No gallbladder wall thickening or pericholecystic fluid. No sonographic Murphy's sign Elicited.CBD is normal MR Abdomen 04/22/18:  There is 1.1 cm gallstone in the gallbladder, but no appreciable hepatic steatosis or focal hepatic lesion to further explain the patient's abnormal liver enzymes. 2. Small chronically stable left adrenal nodule has an appearance favoring adrenal adenoma. 3. Cardiomegaly  Denies abdominal pain this am. Appetite slowly improving.  Thinks this was related to change in "manufacturer" of his medicines at the pharmacy.   LFTs and Bili trending down. No abdominal pain. MRCP negative.  Per surgery no concern for acute cholecystitis at this time.   Objective:   Weight Range: (!) 413 lb (187.3 kg) Body mass index is 61.88 kg/m.   Vital Signs:   Temp:  [97.7 F (36.5 C)-98.7 F (37.1 C)] 97.7 F (36.5 C) (05/24 0450) Pulse Rate:  [57-94] 67 (05/24 0450) Resp:  [16-20] 20 (05/23 2259) BP: (100-146)/(66-96) 120/82 (05/24 0450) SpO2:  [93 %-100 %] 96 % (05/24 0450) Weight:  [409 lb 5 oz (185.7 kg)-413 lb (187.3 kg)] 413 lb (187.3 kg) (05/24 0500) Last BM Date: 04/21/18  Weight change: Filed Weights   04/21/18 1807 04/22/18 1721 04/23/18 0500  Weight: (!) 411 lb (186.4 kg) (!) 409 lb 5 oz (185.7 kg) (!) 413 lb (187.3 kg)    Intake/Output:   Intake/Output Summary (Last 24 hours) at 04/23/2018 0957 Last data filed at 04/23/2018 0500 Gross per 24 hour  Intake 243 ml  Output -  Net 243 ml      Physical Exam    General:  Seated in chair. NAD.  HEENT: Normal Neck: Supple. JVP does not appear elevated. Carotids 2+ bilat; no bruits. No lymphadenopathy or thyromegaly appreciated. Cor: PMI nondisplaced. Regular rate & rhythm. No rubs, gallops or murmurs. Lungs:  Clear Abdomen: Soft, nontender, nondistended. No hepatosplenomegaly. No bruits or masses. Good bowel sounds. Extremities: No cyanosis, clubbing, or rash. No edema.  Neuro: Alert & orientedx3, cranial nerves grossly intact. moves all 4 extremities w/o difficulty. Affect pleasant  Telemetry   NSR 60-70s, personally reviewed.   EKG    No new tracings.    Labs    CBC Recent Labs    04/21/18 1150 04/22/18 0242 04/23/18 0428  WBC 6.9 9.6 6.7  NEUTROABS 2.7  --   --   HGB 15.4 15.0 13.7  HCT 43.8 42.7 38.5*  MCV 85.7 81.6 81.7  PLT 280.0 285 440   Basic Metabolic Panel Recent Labs    04/22/18 0242 04/23/18 0428  NA 140 139  K 3.5 3.3*  CL 102 102  CO2 27 25  GLUCOSE 196* 201*  BUN 15 19  CREATININE 1.32* 1.22  CALCIUM 9.4 9.1   Liver Function Tests Recent Labs    04/22/18 0242 04/22/18 0623 04/23/18 0428  AST 96*  --  69*  ALT 182*  --  137*  ALKPHOS 335*  --  274*  BILITOT 5.0* 4.9* 4.7*  PROT 8.2*  --  7.0  ALBUMIN 3.8  --  3.2*   Recent Labs    04/22/18 0242 04/23/18 0428  LIPASE 102* 90*   Cardiac Enzymes No results for input(s): CKTOTAL, CKMB, CKMBINDEX, TROPONINI in the last 72 hours.  BNP: BNP (last 3 results) Recent Labs    01/28/18 1013 02/25/18 1047  03/19/18 1411  BNP 69.6 106.5* 212.1*    ProBNP (last 3 results) No results for input(s): PROBNP in the last 8760 hours.   D-Dimer No results for input(s): DDIMER in the last 72 hours. Hemoglobin A1C No results for input(s): HGBA1C in the last 72 hours. Fasting Lipid Panel No results for input(s): CHOL, HDL, LDLCALC, TRIG, CHOLHDL, LDLDIRECT in the last 72 hours. Thyroid Function Tests No results for input(s): TSH, T4TOTAL, T3FREE, THYROIDAB in the last 72 hours.  Invalid input(s): FREET3  Other results:   Imaging    Mr 3d Recon At Scanner  Result Date: 04/22/2018 CLINICAL DATA:  Postprandial nausea and vomiting. Congestive heart failure. Poor appetite. Vomiting. Dark  urine and abnormal blood work. Malaise. Abnormal elevated liver function tests. Cholelithiasis. Hepatic steatosis suspected on ultrasound. EXAM: MRI ABDOMEN WITHOUT AND WITH CONTRAST (INCLUDING MRCP) TECHNIQUE: Multiplanar multisequence MR imaging of the abdomen was performed both before and after the administration of intravenous contrast. Heavily T2-weighted images of the biliary and pancreatic ducts were obtained, and three-dimensional MRCP images were rendered by post processing. CONTRAST:  6mL MULTIHANCE GADOBENATE DIMEGLUMINE 529 MG/ML IV SOLN COMPARISON:  Multiple exams, including 04/22/2018 and 06/12/2015 FINDINGS: Despite efforts by the technologist and patient, motion artifact is present on today's exam and could not be eliminated. This reduces exam sensitivity and specificity. Body habitus reduces diagnostic sensitivity and specificity. Lower chest: Moderate cardiomegaly. Hepatobiliary: 1.1 cm gallstone in the gallbladder. No appreciable degree of steatosis. No focal abnormal hepatic lesion. No biliary dilatation or significant irregularity or normal enhancement along the biliary tree. Pancreas:  Unremarkable Spleen:  Unremarkable Adrenals/Urinary Tract: 1.5 by 1.7 cm nodule of the left adrenal gland appears to demonstrates signal dropout on out of phase images favoring adenoma. This is not appreciably changed in size from 06/12/2015. Several tiny T2 hyperintense renal lesions are likely cysts but technically too small to characterize given the degree of motion artifact. Stomach/Bowel: Unremarkable Vascular/Lymphatic:  Unremarkable Other:  No supplemental non-categorized findings. Musculoskeletal: Unremarkable IMPRESSION: 1. There is 1.1 cm gallstone in the gallbladder, but no appreciable hepatic steatosis or focal hepatic lesion to further explain the patient's abnormal liver enzymes. 2. Small chronically stable left adrenal nodule has an appearance favoring adrenal adenoma. 3. Cardiomegaly.  Electronically Signed   By: Van Clines M.D.   On: 04/22/2018 15:16   Mr Abdomen Mrcp Moise Boring Contast  Result Date: 04/22/2018 CLINICAL DATA:  Postprandial nausea and vomiting. Congestive heart failure. Poor appetite. Vomiting. Dark urine and abnormal blood work. Malaise. Abnormal elevated liver function tests. Cholelithiasis. Hepatic steatosis suspected on ultrasound. EXAM: MRI ABDOMEN WITHOUT AND WITH CONTRAST (INCLUDING MRCP) TECHNIQUE: Multiplanar multisequence MR imaging of the abdomen was performed both before and after the administration of intravenous contrast. Heavily T2-weighted images of the biliary and pancreatic ducts were obtained, and three-dimensional MRCP images were rendered by post processing. CONTRAST:  72mL MULTIHANCE GADOBENATE DIMEGLUMINE 529 MG/ML IV SOLN COMPARISON:  Multiple exams, including 04/22/2018 and 06/12/2015 FINDINGS: Despite efforts by the technologist and patient, motion artifact is present on today's exam and could not be eliminated. This reduces exam sensitivity and specificity. Body habitus reduces diagnostic sensitivity and specificity. Lower chest: Moderate cardiomegaly. Hepatobiliary: 1.1 cm gallstone in the gallbladder. No appreciable degree of steatosis. No focal abnormal hepatic lesion. No biliary dilatation or significant irregularity or normal enhancement along the biliary tree. Pancreas:  Unremarkable Spleen:  Unremarkable Adrenals/Urinary Tract: 1.5 by 1.7 cm nodule of the left adrenal gland appears to demonstrates signal dropout on  out of phase images favoring adenoma. This is not appreciably changed in size from 06/12/2015. Several tiny T2 hyperintense renal lesions are likely cysts but technically too small to characterize given the degree of motion artifact. Stomach/Bowel: Unremarkable Vascular/Lymphatic:  Unremarkable Other:  No supplemental non-categorized findings. Musculoskeletal: Unremarkable IMPRESSION: 1. There is 1.1 cm gallstone in the  gallbladder, but no appreciable hepatic steatosis or focal hepatic lesion to further explain the patient's abnormal liver enzymes. 2. Small chronically stable left adrenal nodule has an appearance favoring adrenal adenoma. 3. Cardiomegaly. Electronically Signed   By: Van Clines M.D.   On: 04/22/2018 15:16      Medications:     Scheduled Medications: . clonazePAM  0.5 mg Oral BID  . enoxaparin (LOVENOX) injection  90 mg Subcutaneous Daily  . insulin aspart  0-9 Units Subcutaneous Q4H  . metoprolol succinate  100 mg Oral Daily  . nicotine  21 mg Transdermal Daily  . sodium chloride flush  3 mL Intravenous Q12H     Infusions: . sodium chloride       PRN Medications:  sodium chloride, fentaNYL (SUBLIMAZE) injection, hydrOXYzine, ondansetron **OR** ondansetron (ZOFRAN) IV, sodium chloride flush    Patient Profile   DAYVIN ABER is a 52 y.o. male with a past medical history of NICM, EF 25-30% in March 2018, felt to be related to prior ETOH abuse. He also has a history of PE 03/2014 completed a years course of Xarelto, morbid obesity, OSA, and tobacco abuse.   Admitted 04/22/18 with abnormal LFTs, itching, and N/V/D.   Assessment/Plan   1. Transaminitis - T bili trending down. 4.7 today. - LFTs trending down.  - Of note, his weight is down approx 30 lbs in around a month. He states he has made drastic diet changes and ? If this has been contributor  2. Chronic systolic CHF, non-ischemic CM -EF 15% by echo in 03/2017. No CAD by cath in 2018.  - ? ETOH etiology - Pt refused ICD.  - Continue Toprol 100 mg daily - Would resume spiro at 12.5 mg this evening - Would resume losartan this evening if pressures tolerated.  - Would resume torsemide as his appetite improves. Volume status looks OK today.  - Refuses bidil with HAs.   3. Obesity - Body mass index is 61.88 kg/m.  - Dieting and considering bariatric surgery.   4. Smoking - Encouraged complete cessation.     5. OSA - Reports compliance.   6. Hx Afib - Quiescent.  - Refuses AC.  - CHA2DS2/VASc is at least 3   7. Pre-operative assessment:  - Pt is likely at least Moderate Risk to peri-operative event due to severe LV dysfunction.  - Not prohibitive if deemed necessary, but it appears conservative approach without surgery for now.   Would aim to get him back on his PTA cardiac meds for discharge.  Will schedule HF follow up in case he goes over weekend, as it looks like no surgery at this time.   Medication concerns reviewed with patient and pharmacy team. Barriers identified: Compliance  Length of Stay: 1  Shirley Friar, Hershal Coria  04/23/2018, 9:57 AM  Advanced Heart Failure Team Pager [phone] (M-F; 7a - 4p)  Please contact [facility] for night-coverage after hours (4p -7a ) and weekends on [email or web address]  Patient seen with PA, agree with the above note.  He feels much better, back to normal today.  He has eaten breakfast.  Cause of LFT rise is still  somewhat unclear but they are coming down.  GI following.   Volume status looks ok, he can go back on his cardiac meds as prior to admission, start torsemide back tomorrow.   [name] 04/23/2018 12:57 PM

## 2018-04-23 NOTE — Progress Notes (Addendum)
PROGRESS NOTE                                                                                                                                                                                                             Patient Demographics:    [name], is a 52 y.o. male, DOB - 1966/02/11, LFY:[id number]  Admit date - 04/21/2018   Admitting Physician [name], MD  Outpatient Primary MD for the patient is [name], MD  LOS - 1   Chief Complaint  Patient presents with  . Abnormal Lab       Brief Narrative   52 y.o. 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  His work-up significant for Cholelithiasis, no evidence of gallstones and CBD, was seen by general surgery and GI, gallstone thought unlikely cause of his transaminitis.    Subjective:    [name] today has, No headache, No chest pain, denies any nausea, vomiting or abdominal pain since admission .   Assessment  & Plan :    Principal Problem:   Jaundice Active Problems:   Morbid obesity (HCC)   GAD (generalized anxiety disorder)   Essential hypertension   Chronic combined systolic and diastolic CHF (congestive heart failure) (HCC)   OSA (obstructive sleep apnea) with BiPap and oxygen   Type II diabetes mellitus with manifestations (HCC)   Alcohol abuse   PE (pulmonary thromboembolism) (HCC)   Cholelithiasis   CKD (chronic kidney disease), stage II   Dermatitis/jaundice   - Presents with several weeks of postprandial nausea and more recent pruritis, dark urine, and N/V  - Found to have alk phos 335, AST 96, ALT 182, and total  bili 5.0  on admission -Right upper quadrant ultrasound significant for cholelithiasis, MRCP with no evidence of choledocholithiasis. -General surgery input greatly appreciated, unlikely cholecystitis, even his heart condition, and this point not a surgical candidate -GI input greatly appreciated, autoimmune and infectious etiology work-up has been sent, waiting results. 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 -Continue to trend his LFTs -Reports recent alcohol binge where he drank half 1/5 of hard liquor 2 days ago, this is most likely contributing as well  Chronic combined systolic & diastolic CHF  - Appears well-compensated  - EF only 15% on TTE from one yr ago, follows with cardiology, refused ICD  - Hold diuretics initially given recent anorexia and vomiting  - Follow daily wt and I/O's, SLIV, continue beta-blocker as tolerated, blood pressure acceptable, will resume losartan, renal function is stable we will resume on Aldactone.  Hypertension  - BP at goal  - Continue Toprol as tolerated, hold losartan in light increased creatinine    Type II DM  - A1c was 7.6% in September  - Diet-controlled currently  - Follow CBG's and use SSI with Novolog as needed    Generalized anxiety disorder  - Stable, continue Klonopin with dose-reduction and hold Zoloft in light of #1     OSA  - Continue BiPAP qHS         Code Status : Full code  Family Communication  : None at bedside  Disposition Plan  : Home once stable  Consults  : GI, general surgery  Procedures  : None  DVT Prophylaxis  : Subcu Lovenox  Lab Results  Component Value Date   PLT 254 04/23/2018    Antibiotics  :    Anti-infectives (From admission, onward)   None        Objective:   Vitals:   04/22/18 2259 04/23/18 0450 04/23/18 0500 04/23/18 1306  BP:  120/82  117/80    Pulse: 68 67  (!) 59  Resp: 20   18  Temp:  97.7 F (36.5 C)  98.1 F (36.7 C)  TempSrc:  Oral  Oral  SpO2: 98% 96%  98%  Weight:   (!) 187.3 kg (413 lb)   Height:        Wt Readings from Last 3 Encounters:  04/23/18 (!) 187.3 kg (413 lb)  04/21/18 (!) 186.4 kg (411 lb)  04/02/18 (!) 197.3 kg (435 lb)     Intake/Output Summary (Last 24 hours) at 04/23/2018 1401 Last data filed at 04/23/2018 1300 Gross per 24 hour  Intake 740 ml  Output -  Net 740 ml     Physical Exam  Awake Alert, Oriented X 3, No new F.N deficits, Normal affect Symmetrical Chest wall movement, Good air movement bilaterally, CTAB RRR,No Gallops,Rubs or new Murmurs, No Parasternal Heave +ve B.Sounds, Abd Soft, No tenderness,No rebound - guarding or rigidity. No Cyanosis, Clubbing or edema, No new Rash or bruise       Data Review:    CBC Recent Labs  Lab 04/21/18 1150 04/22/18 0242 04/23/18 0428  WBC 6.9 9.6 6.7  HGB 15.4 15.0 13.7  HCT 43.8 42.7 38.5*  PLT 280.0 285 254  MCV 85.7 81.6 81.7  MCH  --  28.7 29.1  MCHC 35.0 35.1 35.6  RDW 15.3 14.6 14.3  LYMPHSABS 3.1  --   --   MONOABS 0.8  --   --   EOSABS 0.3  --   --   BASOSABS 0.1  --   --     Chemistries  Recent Labs  Lab 04/21/18 1150 04/22/18 0242 04/22/18 0623 04/23/18 0428  NA 138 140  --  139  K 3.6 3.5  --  3.3*  CL 102 102  --  102  CO2 24 27  --  25  GLUCOSE 230* 196*  --  201*  BUN 15 15  --  19  CREATININE 1.06 1.32*  --  1.22  CALCIUM 9.6 9.4  --  9.1  AST 84* 96*  --  69*  ALT 178* 182*  --  137*  ALKPHOS 356* 335*  --  274*  BILITOT 4.6* 5.0* 4.9* 4.7*   ------------------------------------------------------------------------------------------------------------------ No results for input(s): CHOL, HDL, LDLCALC, TRIG, CHOLHDL, LDLDIRECT in the last 72 hours.  Lab Results  Component Value Date   HGBA1C 7.6 (H) 03/25/2018    ------------------------------------------------------------------------------------------------------------------ No results for input(s): TSH, T4TOTAL, T3FREE, THYROIDAB in the last 72 hours.  Invalid input(s): FREET3 ------------------------------------------------------------------------------------------------------------------ Recent Labs    04/21/18 1150 04/22/18 1756  VITAMINB12 1,198*  --   FERRITIN  --  448*    Coagulation profile Recent Labs  Lab 04/22/18 0623  INR 1.00    No results for input(s): DDIMER in the last 72 hours.  Cardiac Enzymes No results for input(s): CKMB, TROPONINI, MYOGLOBIN in the last 168 hours.  Invalid input(s): CK ------------------------------------------------------------------------------------------------------------------    Component Value Date/Time   BNP 212.1 (H) 03/19/2018 1411   BNP 73 08/24/2017 1059    Inpatient Medications  Scheduled Meds: . clonazePAM  0.5 mg Oral BID  . enoxaparin (LOVENOX) injection  90 mg Subcutaneous Daily  . insulin aspart  0-9 Units Subcutaneous Q4H  . losartan  50 mg Oral BID  . metoprolol succinate  100 mg Oral Daily  . nicotine  21 mg Transdermal Daily  . sodium chloride flush  3 mL Intravenous Q12H  . [START ON 04/24/2018] torsemide  40 mg Oral Daily   Continuous Infusions: . sodium chloride     PRN Meds:.sodium chloride, fentaNYL (SUBLIMAZE) injection, hydrOXYzine, ondansetron **OR** ondansetron (ZOFRAN) IV, sodium chloride flush  Micro Results Recent Results (from the past 240 hour(s))  Urine Culture     Status: None   Collection Time: 04/21/18 11:50 AM  Result Value Ref Range Status   MICRO NUMBER: [id number]  Final   SPECIMEN QUALITY: ADEQUATE  Final   Sample Source NOT GIVEN  Final   STATUS: FINAL  Final   Result: No Growth  Final    Radiology Reports US Abdomen Complete  Result Date: 04/22/2018 CLINICAL DATA:  Elevated liver function tests, nausea and postprandial pain.  EXAM: ABDOMEN ULTRASOUND COMPLETE COMPARISON:  None. FINDINGS: Limited assessment due to large body habitus. Gallbladder: Multiple echogenic gallstones measuring to 18 mm with acoustic shadowing. Gallbladder sludge. No gallbladder wall thickening or pericholecystic fluid. No sonographic Murphy's sign elicited. Common bile duct: Diameter: 3 mm Liver: No focal lesion identified. Increased parenchymal echogenicity. Portal vein is patent on color Doppler imaging with normal direction of blood flow towards the liver. IVC: No abnormality visualized. Pancreas: Pancreatic head and included body are normal. Spleen: Limited evaluation, no splenomegaly. Right Kidney: Length: 10.6 cm. Echogenicity within normal limits. No mass or hydronephrosis visualized. Left Kidney: Not sonographically identified. Abdominal aorta: No aneurysm visualized. Other findings: None. IMPRESSION: 1. Habitus limited examination. 2. Cholelithiasis without sonographic findings of acute cholecystitis. 3. Hepatic steatosis/hepatocellular disease. Electronically Signed   By: Elon Alas M.D.   On: 04/22/2018 04:47   Mr 3d Recon At Scanner  Result Date: 04/22/2018 CLINICAL DATA:  Postprandial nausea and vomiting. Congestive heart failure. Poor appetite. Vomiting. Dark urine and abnormal blood work. Malaise. Abnormal elevated liver function tests. Cholelithiasis. Hepatic steatosis suspected on ultrasound. EXAM: MRI ABDOMEN WITHOUT AND WITH  CONTRAST (INCLUDING MRCP) TECHNIQUE: Multiplanar multisequence MR imaging of the abdomen was performed both before and after the administration of intravenous contrast. Heavily T2-weighted images of the biliary and pancreatic ducts were obtained, and three-dimensional MRCP images were rendered by post processing. CONTRAST:  49m MULTIHANCE GADOBENATE DIMEGLUMINE 529 MG/ML IV SOLN COMPARISON:  Multiple exams, including 04/22/2018 and 06/12/2015 FINDINGS: Despite efforts by the technologist and patient, motion  artifact is present on today's exam and could not be eliminated. This reduces exam sensitivity and specificity. Body habitus reduces diagnostic sensitivity and specificity. Lower chest: Moderate cardiomegaly. Hepatobiliary: 1.1 cm gallstone in the gallbladder. No appreciable degree of steatosis. No focal abnormal hepatic lesion. No biliary dilatation or significant irregularity or normal enhancement along the biliary tree. Pancreas:  Unremarkable Spleen:  Unremarkable Adrenals/Urinary Tract: 1.5 by 1.7 cm nodule of the left adrenal gland appears to demonstrates signal dropout on out of phase images favoring adenoma. This is not appreciably changed in size from 06/12/2015. Several tiny T2 hyperintense renal lesions are likely cysts but technically too small to characterize given the degree of motion artifact. Stomach/Bowel: Unremarkable Vascular/Lymphatic:  Unremarkable Other:  No supplemental non-categorized findings. Musculoskeletal: Unremarkable IMPRESSION: 1. There is 1.1 cm gallstone in the gallbladder, but no appreciable hepatic steatosis or focal hepatic lesion to further explain the patient's abnormal liver enzymes. 2. Small chronically stable left adrenal nodule has an appearance favoring adrenal adenoma. 3. Cardiomegaly. Electronically Signed   By: WVan ClinesM.D.   On: 04/22/2018 15:16   Mr Abdomen Mrcp WMoise BoringContast  Result Date: 04/22/2018 CLINICAL DATA:  Postprandial nausea and vomiting. Congestive heart failure. Poor appetite. Vomiting. Dark urine and abnormal blood work. Malaise. Abnormal elevated liver function tests. Cholelithiasis. Hepatic steatosis suspected on ultrasound. EXAM: MRI ABDOMEN WITHOUT AND WITH CONTRAST (INCLUDING MRCP) TECHNIQUE: Multiplanar multisequence MR imaging of the abdomen was performed both before and after the administration of intravenous contrast. Heavily T2-weighted images of the biliary and pancreatic ducts were obtained, and three-dimensional MRCP images  were rendered by post processing. CONTRAST:  227mMULTIHANCE GADOBENATE DIMEGLUMINE 529 MG/ML IV SOLN COMPARISON:  Multiple exams, including 04/22/2018 and 06/12/2015 FINDINGS: Despite efforts by the technologist and patient, motion artifact is present on today's exam and could not be eliminated. This reduces exam sensitivity and specificity. Body habitus reduces diagnostic sensitivity and specificity. Lower chest: Moderate cardiomegaly. Hepatobiliary: 1.1 cm gallstone in the gallbladder. No appreciable degree of steatosis. No focal abnormal hepatic lesion. No biliary dilatation or significant irregularity or normal enhancement along the biliary tree. Pancreas:  Unremarkable Spleen:  Unremarkable Adrenals/Urinary Tract: 1.5 by 1.7 cm nodule of the left adrenal gland appears to demonstrates signal dropout on out of phase images favoring adenoma. This is not appreciably changed in size from 06/12/2015. Several tiny T2 hyperintense renal lesions are likely cysts but technically too small to characterize given the degree of motion artifact. Stomach/Bowel: Unremarkable Vascular/Lymphatic:  Unremarkable Other:  No supplemental non-categorized findings. Musculoskeletal: Unremarkable IMPRESSION: 1. There is 1.1 cm gallstone in the gallbladder, but no appreciable hepatic steatosis or focal hepatic lesion to further explain the patient's abnormal liver enzymes. 2. Small chronically stable left adrenal nodule has an appearance favoring adrenal adenoma. 3. Cardiomegaly. Electronically Signed   By: WaVan Clines.D.   On: 04/22/2018 15:16     DaPhillips Climes.D on 04/23/2018 at 2:01 PM  Between 7am to 7pm - Pager - [phone] 7pm go to [email or web address] - password [email or web address]  [facility] -  Office  [phone]

## 2018-04-24 LAB — BASIC METABOLIC PANEL
ANION GAP: 8 (ref 5–15)
BUN: 15 mg/dL (ref 6–20)
CHLORIDE: 105 mmol/L (ref 101–111)
CO2: 26 mmol/L (ref 22–32)
CREATININE: 1.07 mg/dL (ref 0.61–1.24)
Calcium: 9.1 mg/dL (ref 8.9–10.3)
GFR calc non Af Amer: >60 mL/min (ref >60)
Glucose, Bld: 167 mg/dL — ABNORMAL HIGH (ref 65–99)
POTASSIUM: 3.4 mmol/L — AB (ref 3.5–5.1)
SODIUM: 139 mmol/L (ref 135–145)

## 2018-04-24 LAB — HEPATIC FUNCTION PANEL
ALT: 136 U/L — AB (ref 17–63)
AST: 79 U/L — AB (ref 15–41)
Albumin: 3.1 g/dL — ABNORMAL LOW (ref 3.5–5.0)
Alkaline Phosphatase: 261 U/L — ABNORMAL HIGH (ref 38–126)
BILIRUBIN INDIRECT: 1.9 mg/dL — AB (ref 0.3–0.9)
Bilirubin, Direct: 2.5 mg/dL — ABNORMAL HIGH (ref 0.1–0.5)
TOTAL PROTEIN: 7 g/dL (ref 6.5–8.1)
Total Bilirubin: 4.4 mg/dL — ABNORMAL HIGH (ref 0.3–1.2)

## 2018-04-24 LAB — GLUCOSE, CAPILLARY
GLUCOSE-CAPILLARY: 170 mg/dL — AB (ref 65–99)
Glucose-Capillary: 187 mg/dL — ABNORMAL HIGH (ref 65–99)
Glucose-Capillary: 186 mg/dL — ABNORMAL HIGH (ref 65–99)

## 2018-04-24 LAB — CBC
HEMATOCRIT: 38.6 % — AB (ref 39.0–52.0)
HEMOGLOBIN: 13.8 g/dL (ref 13.0–17.0)
MCH: 29 pg (ref 26.0–34.0)
MCHC: 35.8 g/dL (ref 30.0–36.0)
MCV: 81.1 fL (ref 78.0–100.0)
Platelets: 236 10*3/uL (ref 150–400)
RBC: 4.76 MIL/uL (ref 4.22–5.81)
RDW: 14.4 % (ref 11.5–15.5)
WBC: 6.1 10*3/uL (ref 4.0–10.5)

## 2018-04-24 LAB — CERULOPLASMIN: CERULOPLASMIN: 36.7 mg/dL — AB (ref 16.0–31.0)

## 2018-04-24 LAB — URINE CULTURE
MICRO NUMBER: 90622173
Result:: NO GROWTH
SPECIMEN QUALITY: ADEQUATE

## 2018-04-24 LAB — VITAMIN B1: Vitamin B1 (Thiamine): <6 nmol/L — ABNORMAL LOW (ref 8–30)

## 2018-04-24 LAB — ALPHA-1-ANTITRYPSIN: A1 ANTITRYPSIN SER: 143 mg/dL (ref 90–200)

## 2018-04-24 MED ORDER — POTASSIUM CHLORIDE CRYS ER 20 MEQ PO TBCR
40.0000 meq | EXTENDED_RELEASE_TABLET | Freq: Once | ORAL | Status: AC
  Administered 2018-04-24: 40 meq via ORAL
  Filled 2018-04-24: qty 2

## 2018-04-24 NOTE — Progress Notes (Signed)
Patient refused IV insertion

## 2018-04-24 NOTE — Discharge Instructions (Signed)
Follow with Primary MD [name], MD in 7 days   Get CBC, CMP,checked  by Primary MD next visit.    Activity: As tolerated with Full fall precautions use walker/cane & assistance as needed   Disposition Home    Diet: Heart Healthy , with feeding assistance and aspiration precautions.  For Heart failure patients - Check your Weight same time everyday, if you gain over 2 pounds, or you develop in leg swelling, experience more shortness of breath or chest pain, call your Primary MD immediately. Follow Cardiac Low Salt Diet and 1.5 lit/day fluid restriction.   On your next visit with your primary care physician please Get Medicines reviewed and adjusted.   Please request your Prim.MD to go over all Hospital Tests and Procedure/Radiological results at the follow up, please get all Hospital records sent to your Prim MD by signing hospital release before you go home.   If you experience worsening of your admission symptoms, develop shortness of breath, life threatening emergency, suicidal or homicidal thoughts you must seek medical attention immediately by calling 911 or calling your MD immediately  if symptoms less severe.  You Must read complete instructions/literature along with all the possible adverse reactions/side effects for all the Medicines you take and that have been prescribed to you. Take any new Medicines after you have completely understood and accpet all the possible adverse reactions/side effects.   Do not drive, operating heavy machinery, perform activities at heights, swimming or participation in water activities or provide baby sitting services if your were admitted for syncope or siezures until you have seen by Primary MD or a Neurologist and advised to do so again.  Do not drive when taking Pain medications.    Do not take more than prescribed Pain, Sleep and Anxiety Medications  Special Instructions: If you have smoked or chewed Tobacco  in the last 2 yrs please  stop smoking, stop any regular Alcohol  and or any Recreational drug use.  Wear Seat belts while driving.   Please note  You were cared for by a hospitalist during your hospital stay. If you have any questions about your discharge medications or the care you received while you were in the hospital after you are discharged, you can call the unit and asked to speak with the hospitalist on call if the hospitalist that took care of you is not available. Once you are discharged, your primary care physician will handle any further medical issues. Please note that NO REFILLS for any discharge medications will be authorized once you are discharged, as it is imperative that you return to your primary care physician (or establish a relationship with a primary care physician if you do not have one) for your aftercare needs so that they can reassess your need for medications and monitor your lab values.

## 2018-04-24 NOTE — Progress Notes (Addendum)
[name] to be D/C'd Home per MD order.  Discussed prescriptions and follow up appointments with the patient. Prescriptions given to patient, medication list explained in detail. Pt verbalized understanding.  Allergies as of 04/24/2018      Reactions   Ace Inhibitors Anaphylaxis, Swelling   Buspirone Other (See Comments)   dizziness      Medication List    TAKE these medications   allopurinol 100 MG tablet Commonly known as:  ZYLOPRIM Take 1 tablet (100 mg total) by mouth daily. What changed:    when to take this  reasons to take this   ammonium lactate 12 % cream Commonly known as:  LAC-HYDRIN Apply topically as needed for dry skin. What changed:    how much to take  when to take this   aspirin EC 81 MG tablet Take 1 tablet (81 mg total) by mouth daily.   clonazePAM 1 MG tablet Commonly known as:  KLONOPIN TAKE 1 TABLET BY MOUTH 3 TIMES DAILY AS NEEDED FOR ANXIETY What changed:    how much to take  how to take this  when to take this  additional instructions   hydrOXYzine 25 MG tablet Commonly known as:  ATARAX/VISTARIL Take 1 tablet (25 mg total) by mouth 3 (three) times daily as needed for itching.   losartan 50 MG tablet Commonly known as:  COZAAR Take 1 tablet (50 mg total) by mouth 2 (two) times daily. What changed:  when to take this   magnesium oxide 400 MG tablet Commonly known as:  MAG-OX Take 400 mg by mouth daily as needed (cramping).   metoprolol succinate 100 MG 24 hr tablet Commonly known as:  TOPROL-XL Take 100 mg by mouth daily. Take with or immediately following a meal.   NON FORMULARY Bipap, pressure 16/12 with 2L of O2   pantoprazole 40 MG tablet Commonly known as:  PROTONIX TAKE 1 TABLET (40 MG TOTAL) BY MOUTH DAILY.   potassium chloride SA 20 MEQ tablet Commonly known as:  K-DUR,KLOR-CON Take 20 mEq by mouth daily as needed (cramping).   sertraline 100 MG tablet Commonly known as:  ZOLOFT Take 1 tablet (100 mg  total) by mouth daily.   sildenafil 20 MG tablet Commonly known as:  REVATIO Take 1 tablet (20 mg total) by mouth daily as needed. What changed:  reasons to take this   spironolactone 25 MG tablet Commonly known as:  ALDACTONE Take 1 tablet (25 mg total) by mouth daily.   torsemide 20 MG tablet Commonly known as:  DEMADEX Take 2 tablets (40 mg total) by mouth daily.       Vitals:   04/24/18 0939 04/24/18 1246  BP: 119/69 120/89  Pulse: (!) 58 62  Resp:  20  Temp:  97.8 F (36.6 C)  SpO2: 99% 100%    Skin clean, dry and intact without evidence of skin break down, no evidence of skin tears noted. Pt denies pain at this time. No complaints noted.  An After Visit Summary was printed and given to the patient. Patient escorted via [place], and D/C home via private auto.  Emilio Math, RN [facility] Phone [phone]

## 2018-04-24 NOTE — Discharge Summary (Signed)
[name], is a 52 y.o. male  DOB [birth year]  MRN [id number].  Admission date:  04/21/2018  Admitting Physician  [name], MD  Discharge Date:  04/24/2018   Primary MD  [name], MD  Recommendations for primary care physician for things to follow:  -Please follow on final work-up of autoimmune hepatitis -Please check CBC, BMP, teas during next visit to ensure continued improvement of liver function  Admission Diagnosis  Hyperbilirubinemia [E80.6] Elevated LFTs [R94.5] AKI (acute kidney injury) ([place]) [N17.9] Calculus of gallbladder with biliary obstruction but without cholecystitis [K80.21]   Discharge Diagnosis  Hyperbilirubinemia [E80.6] Elevated LFTs [R94.5] AKI (acute kidney injury) ([place]) [N17.9] Calculus of gallbladder with biliary obstruction but without cholecystitis [K80.21]    Principal Problem:   Jaundice Active Problems:   Morbid obesity (HCC)   GAD (generalized anxiety disorder)   Essential hypertension   Chronic combined systolic and diastolic CHF (congestive heart failure) (HCC)   OSA (obstructive sleep apnea) with BiPap and oxygen   Type II diabetes mellitus with manifestations (HCC)   Alcohol abuse   PE (pulmonary thromboembolism) ([place])   Cholelithiasis   CKD (chronic kidney disease), stage II      Past Medical History:  Diagnosis Date  . Alcohol abuse   . Anxiety state, unspecified   . Atrial fibrillation ([place])   . CHF (congestive heart failure) ([place])   . Chronic systolic heart failure ([place])   . Diabetes mellitus, type II ([place])   . Edema   . History of medication noncompliance   . Migraine   . Obesity, unspecified   . Obstructive sleep apnea   . Psychiatric disorder   . Pulmonary embolism ([place])   . Shortness of breath     Past Surgical History:  Procedure Laterality Date  . CARDIAC CATHETERIZATION    . CARDIAC CATHETERIZATION N/A 08/13/2015     Procedure: Right/Left Heart Cath and Coronary Angiography;  Surgeon: [name], MD;  Location: [facility];  Service: Cardiovascular;  Laterality: N/A;  . RIGHT/LEFT HEART CATH AND CORONARY ANGIOGRAPHY N/A 04/14/2017   Procedure: Right/Left Heart Cath and Coronary Angiography;  Surgeon: [name], MD;  Location: [facility];  Service: Cardiovascular;  Laterality: N/A;  . TESTICLE SURGERY         History of present illness and  Hospital Course:     Kindly see H&P for history of present illness and admission details, please review complete Labs, Consult reports and Test reports for all details in brief  HPI  from the history and physical done on the day of admission   HPI: [name] is a 52 y.o. 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  Patient reports  several weeks of general malaise with poor appetite and post prandial nausea.  This has worsened significantly over the past week, over which interval he is also been experiencing intense pruritus and darkened urine.  He reports some mild tenderness in the epigastrium.  Denies fevers or chills.  Denies any significant alcohol use, and denies use of acetaminophen.  He was evaluated by his PCP for these complaints, blood work was obtained, and he was noted to have abnormal LFTs.  ED Course: Upon arrival to the ED, patient is found to be afebrile, saturating well on room air, bradycardic in the mid 50s, and with stable blood pressure.  Chemistry panel is notable for serum creatinine 1.32, slightly higher than recent priors, glucose 196, alkaline phosphatase 335, AST 96, ALT 182,  and total bilirubin 5.0.  CBC is unremarkable.  Abdominal ultrasound was performed and notable for cholelithiasis without acute cholecystitis, without CBD dilation, and with findings consistent with hepatic steatosis.  Hospitalist were asked to admit.      Hospital Course  52 y.o.malewith medical history significant forobesity (BMI 62), chronic combined CHF with EF 15%, type 2 diabetes mellitus, hypertension, history of PE no longer on anticoagulation, andgeneralized anxiety disorder, now presenting to the emergency department for evaluation of poor appetite, postprandial nausea with vomiting, pruritus, darkened urine, and abnormal blood work with his PCP.  His work-up significant for Cholelithiasis, no evidence of gallstones and CBD, was seen by general surgery and GI, gallstone thought unlikely cause of his transaminitis, hepatitis panel is negative, autoimmune work-up remains pending, to follow with GI as an outpatient   Transaminitis/jaundice -Presents with several weeks of postprandial nausea and more recent pruritis, dark urine, and N/V -Found to have alk phos 335, AST 96, ALT 182, and total bili 5.0 on admission, had continued to trend down steadily during hospital stay. -Right upper quadrant ultrasound significant for cholelithiasis, MRCP with no evidence of choledocholithiasis. -General surgery input greatly appreciated, unlikely cholecystitis, as well give his heart condition, and this point he is not a surgical candidate, as well his abnormal liver enzymes felt unlikely related to cholelithiasis as well -GI input greatly appreciated, autoimmune and infectious etiology work-up has been sent, waiting results.  To follow results as an outpatient -LFTs trending down, he is tolerating his diet, no nausea, no vomiting, he will be discharged home today as discussed with GI with outpatient follow-up with Dr. Watt Climes from [place] . -Reports recent alcohol binge where he drank half 1/5 of  hard liquor 2 days ago, this is most likely contributing as well  Chronic combined systolic & diastolic CHF -Appears well-compensated -EF only 15% on TTE from one yr ago, follows with cardiology, refused ICD -Hold diuretics initially given recent anorexia and vomiting -Him Aldactone, beta-blockers and losartan  Hypertension -Resume home meds  Type II DM -A1c was 7.6% in September -Diet-controlled currently  Generalized anxiety disorder -resume home meds  OSA -Continue BiPAP qHS  Hypokalemia -Repleted   Discharge Condition:  stable   Follow UP  Follow-up Information    Clegg, Amy D, NP Follow up on 05/04/2018.   Specialty:  Cardiology Why:  Heart Failure Follow up-12:00-Garage Code: 1300.  Take all am meds and bring all med bottles with you to appointment. Contact information: [address] [place] [address] [phone]        Clarene Essex, MD Follow up in 1 week(s).   Specialty:  Gastroenterology Contact information: [address] [place] [address] [phone]  Discharge Instructions  and  Discharge Medications    Discharge Instructions    Diet - low sodium heart healthy   Complete by:  As directed    Increase activity slowly   Complete by:  As directed      Allergies as of 04/24/2018      Reactions   Ace Inhibitors Anaphylaxis, Swelling   Buspirone Other (See Comments)   dizziness      Medication List    TAKE these medications   allopurinol 100 MG tablet Commonly known as:  ZYLOPRIM Take 1 tablet (100 mg total) by mouth daily. What changed:    when to take this  reasons to take this   ammonium lactate 12 % cream Commonly known as:  LAC-HYDRIN Apply topically as needed for dry skin. What changed:    how much to take  when to take this   aspirin EC 81 MG tablet Take 1 tablet (81 mg total) by mouth daily.   clonazePAM 1 MG tablet Commonly known as:   KLONOPIN TAKE 1 TABLET BY MOUTH 3 TIMES DAILY AS NEEDED FOR ANXIETY What changed:    how much to take  how to take this  when to take this  additional instructions   hydrOXYzine 25 MG tablet Commonly known as:  ATARAX/VISTARIL Take 1 tablet (25 mg total) by mouth 3 (three) times daily as needed for itching.   losartan 50 MG tablet Commonly known as:  COZAAR Take 1 tablet (50 mg total) by mouth 2 (two) times daily. What changed:  when to take this   magnesium oxide 400 MG tablet Commonly known as:  MAG-OX Take 400 mg by mouth daily as needed (cramping).   metoprolol succinate 100 MG 24 hr tablet Commonly known as:  TOPROL-XL Take 100 mg by mouth daily. Take with or immediately following a meal.   NON FORMULARY Bipap, pressure 16/12 with 2L of O2   pantoprazole 40 MG tablet Commonly known as:  PROTONIX TAKE 1 TABLET (40 MG TOTAL) BY MOUTH DAILY.   potassium chloride SA 20 MEQ tablet Commonly known as:  K-DUR,KLOR-CON Take 20 mEq by mouth daily as needed (cramping).   sertraline 100 MG tablet Commonly known as:  ZOLOFT Take 1 tablet (100 mg total) by mouth daily.   sildenafil 20 MG tablet Commonly known as:  REVATIO Take 1 tablet (20 mg total) by mouth daily as needed. What changed:  reasons to take this   spironolactone 25 MG tablet Commonly known as:  ALDACTONE Take 1 tablet (25 mg total) by mouth daily.   torsemide 20 MG tablet Commonly known as:  DEMADEX Take 2 tablets (40 mg total) by mouth daily.         Diet and Activity recommendation: See Discharge Instructions above   Consults obtained -  GI  General surgery   Major procedures and Radiology Reports - PLEASE review detailed and final reports for all details, in brief -    US Abdomen Complete  Result Date: 04/22/2018 CLINICAL DATA:  Elevated liver function tests, nausea and postprandial pain. EXAM: ABDOMEN ULTRASOUND COMPLETE COMPARISON:  None. FINDINGS: Limited assessment due to large  body habitus. Gallbladder: Multiple echogenic gallstones measuring to 18 mm with acoustic shadowing. Gallbladder sludge. No gallbladder wall thickening or pericholecystic fluid. No sonographic Murphy's sign elicited. Common bile duct: Diameter: 3 mm Liver: No focal lesion identified. Increased parenchymal echogenicity. Portal vein is patent on color Doppler imaging with normal direction of blood flow towards the liver. IVC:  No abnormality visualized. Pancreas: Pancreatic head and included body are normal. Spleen: Limited evaluation, no splenomegaly. Right Kidney: Length: 10.6 cm. Echogenicity within normal limits. No mass or hydronephrosis visualized. Left Kidney: Not sonographically identified. Abdominal aorta: No aneurysm visualized. Other findings: None. IMPRESSION: 1. Habitus limited examination. 2. Cholelithiasis without sonographic findings of acute cholecystitis. 3. Hepatic steatosis/hepatocellular disease. Electronically Signed   By: Elon Alas M.D.   On: 04/22/2018 04:47   Mr 3d Recon At Scanner  Result Date: 04/22/2018 CLINICAL DATA:  Postprandial nausea and vomiting. Congestive heart failure. Poor appetite. Vomiting. Dark urine and abnormal blood work. Malaise. Abnormal elevated liver function tests. Cholelithiasis. Hepatic steatosis suspected on ultrasound. EXAM: MRI ABDOMEN WITHOUT AND WITH CONTRAST (INCLUDING MRCP) TECHNIQUE: Multiplanar multisequence MR imaging of the abdomen was performed both before and after the administration of intravenous contrast. Heavily T2-weighted images of the biliary and pancreatic ducts were obtained, and three-dimensional MRCP images were rendered by post processing. CONTRAST:  59m MULTIHANCE GADOBENATE DIMEGLUMINE 529 MG/ML IV SOLN COMPARISON:  Multiple exams, including 04/22/2018 and 06/12/2015 FINDINGS: Despite efforts by the technologist and patient, motion artifact is present on today's exam and could not be eliminated. This reduces exam sensitivity and  specificity. Body habitus reduces diagnostic sensitivity and specificity. Lower chest: Moderate cardiomegaly. Hepatobiliary: 1.1 cm gallstone in the gallbladder. No appreciable degree of steatosis. No focal abnormal hepatic lesion. No biliary dilatation or significant irregularity or normal enhancement along the biliary tree. Pancreas:  Unremarkable Spleen:  Unremarkable Adrenals/Urinary Tract: 1.5 by 1.7 cm nodule of the left adrenal gland appears to demonstrates signal dropout on out of phase images favoring adenoma. This is not appreciably changed in size from 06/12/2015. Several tiny T2 hyperintense renal lesions are likely cysts but technically too small to characterize given the degree of motion artifact. Stomach/Bowel: Unremarkable Vascular/Lymphatic:  Unremarkable Other:  No supplemental non-categorized findings. Musculoskeletal: Unremarkable IMPRESSION: 1. There is 1.1 cm gallstone in the gallbladder, but no appreciable hepatic steatosis or focal hepatic lesion to further explain the patient's abnormal liver enzymes. 2. Small chronically stable left adrenal nodule has an appearance favoring adrenal adenoma. 3. Cardiomegaly. Electronically Signed   By: WVan ClinesM.D.   On: 04/22/2018 15:16   Mr Abdomen Mrcp WMoise BoringContast  Result Date: 04/22/2018 CLINICAL DATA:  Postprandial nausea and vomiting. Congestive heart failure. Poor appetite. Vomiting. Dark urine and abnormal blood work. Malaise. Abnormal elevated liver function tests. Cholelithiasis. Hepatic steatosis suspected on ultrasound. EXAM: MRI ABDOMEN WITHOUT AND WITH CONTRAST (INCLUDING MRCP) TECHNIQUE: Multiplanar multisequence MR imaging of the abdomen was performed both before and after the administration of intravenous contrast. Heavily T2-weighted images of the biliary and pancreatic ducts were obtained, and three-dimensional MRCP images were rendered by post processing. CONTRAST:  290mMULTIHANCE GADOBENATE DIMEGLUMINE 529 MG/ML IV SOLN  COMPARISON:  Multiple exams, including 04/22/2018 and 06/12/2015 FINDINGS: Despite efforts by the technologist and patient, motion artifact is present on today's exam and could not be eliminated. This reduces exam sensitivity and specificity. Body habitus reduces diagnostic sensitivity and specificity. Lower chest: Moderate cardiomegaly. Hepatobiliary: 1.1 cm gallstone in the gallbladder. No appreciable degree of steatosis. No focal abnormal hepatic lesion. No biliary dilatation or significant irregularity or normal enhancement along the biliary tree. Pancreas:  Unremarkable Spleen:  Unremarkable Adrenals/Urinary Tract: 1.5 by 1.7 cm nodule of the left adrenal gland appears to demonstrates signal dropout on out of phase images favoring adenoma. This is not appreciably changed in size from 06/12/2015. Several tiny T2 hyperintense renal  lesions are likely cysts but technically too small to characterize given the degree of motion artifact. Stomach/Bowel: Unremarkable Vascular/Lymphatic:  Unremarkable Other:  No supplemental non-categorized findings. Musculoskeletal: Unremarkable IMPRESSION: 1. There is 1.1 cm gallstone in the gallbladder, but no appreciable hepatic steatosis or focal hepatic lesion to further explain the patient's abnormal liver enzymes. 2. Small chronically stable left adrenal nodule has an appearance favoring adrenal adenoma. 3. Cardiomegaly. Electronically Signed   By: Van Clines M.D.   On: 04/22/2018 15:16    Micro Results     Recent Results (from the past 240 hour(s))  Urine Culture     Status: None   Collection Time: 04/21/18 11:50 AM  Result Value Ref Range Status   MICRO NUMBER: [id number]  Final   SPECIMEN QUALITY: ADEQUATE  Final   Sample Source NOT GIVEN  Final   STATUS: FINAL  Final   Result: No Growth  Final       Today   Subjective:   [name] today has no headache,no chest or abdominal pain, feels much better wants to go home today.   Objective:    Blood pressure 119/69, pulse (!) 58, temperature 97.7 F (36.5 C), temperature source Oral, resp. rate 18, height 5' 8.5" (1.74 m), weight (!) 189.5 kg (417 lb 12.4 oz), SpO2 99 %.   Intake/Output Summary (Last 24 hours) at 04/24/2018 1212 Last data filed at 04/23/2018 1300 Gross per 24 hour  Intake 260 ml  Output -  Net 260 ml    Exam Awake Alert, Oriented x 3, No new F.N deficits, Normal affect Symmetrical Chest wall movement, Good air movement bilaterally, CTAB RRR,No Gallops,Rubs or new Murmurs, No Parasternal Heave +ve B.Sounds, Abd Soft, Non tender, No rebound -guarding or rigidity. No Cyanosis, Clubbing or edema, No new Rash or bruise  Data Review   CBC w Diff:  Lab Results  Component Value Date   WBC 6.1 04/24/2018   HGB 13.8 04/24/2018   HCT 38.6 (L) 04/24/2018   PLT 236 04/24/2018   LYMPHOPCT 44.3 04/21/2018   MONOPCT 11.1 04/21/2018   EOSPCT 4.2 04/21/2018   BASOPCT 1.1 04/21/2018    CMP:  Lab Results  Component Value Date   NA 139 04/24/2018   K 3.4 (L) 04/24/2018   CL 105 04/24/2018   CO2 26 04/24/2018   BUN 15 04/24/2018   CREATININE 1.07 04/24/2018   CREATININE 1.18 08/24/2017   PROT 7.0 04/24/2018   ALBUMIN 3.1 (L) 04/24/2018   BILITOT 4.4 (H) 04/24/2018   ALKPHOS 261 (H) 04/24/2018   AST 79 (H) 04/24/2018   ALT 136 (H) 04/24/2018  .   Total Time in preparing paper work, data evaluation and todays exam - 54 minutes  Phillips Climes M.D on 04/24/2018 at 12:12 PM  [facility]   Office  [phone]

## 2018-04-24 NOTE — Plan of Care (Signed)
  Problem: Education: Goal: Knowledge of General Education information will improve Outcome: Progressing   Problem: Clinical Measurements: Goal: Diagnostic test results will improve Outcome: Progressing   Problem: Clinical Measurements: Goal: Respiratory complications will improve Outcome: Progressing   Problem: Clinical Measurements: Goal: Cardiovascular complication will be avoided Outcome: Progressing

## 2018-04-24 NOTE — Progress Notes (Signed)
Notified MD in person that pt had k=3.4. MD stated he would order supplementation.

## 2018-04-25 LAB — ANTI-SMOOTH MUSCLE ANTIBODY, IGG: F-ACTIN AB IGG: 71 U — AB (ref 0–19)

## 2018-04-25 LAB — MITOCHONDRIAL ANTIBODIES

## 2018-04-26 LAB — ANTINUCLEAR ANTIBODIES, IFA: ANA Ab, IFA: NEGATIVE

## 2018-04-29 ENCOUNTER — Encounter: Payer: Self-pay | Admitting: Internal Medicine

## 2018-04-29 ENCOUNTER — Telehealth (HOSPITAL_COMMUNITY): Payer: Self-pay

## 2018-04-29 ENCOUNTER — Ambulatory Visit (INDEPENDENT_AMBULATORY_CARE_PROVIDER_SITE_OTHER): Payer: Medicare HMO | Admitting: Internal Medicine

## 2018-04-29 ENCOUNTER — Other Ambulatory Visit (INDEPENDENT_AMBULATORY_CARE_PROVIDER_SITE_OTHER): Payer: Medicare HMO

## 2018-04-29 VITALS — BP 110/80 | HR 70 | Temp 98.3°F | Ht 68.5 in | Wt >= 6400 oz

## 2018-04-29 DIAGNOSIS — E519 Thiamine deficiency, unspecified: Secondary | ICD-10-CM | POA: Diagnosis not present

## 2018-04-29 DIAGNOSIS — E118 Type 2 diabetes mellitus with unspecified complications: Secondary | ICD-10-CM

## 2018-04-29 DIAGNOSIS — R17 Unspecified jaundice: Secondary | ICD-10-CM

## 2018-04-29 DIAGNOSIS — I10 Essential (primary) hypertension: Secondary | ICD-10-CM

## 2018-04-29 DIAGNOSIS — Z794 Long term (current) use of insulin: Secondary | ICD-10-CM | POA: Diagnosis not present

## 2018-04-29 LAB — CBC WITH DIFFERENTIAL/PLATELET
BASOS PCT: 1.2 % (ref 0.0–3.0)
Basophils Absolute: 0.1 10*3/uL (ref 0.0–0.1)
EOS ABS: 0.2 10*3/uL (ref 0.0–0.7)
EOS PCT: 3.1 % (ref 0.0–5.0)
HCT: 43.1 % (ref 39.0–52.0)
HEMOGLOBIN: 14.9 g/dL (ref 13.0–17.0)
Lymphocytes Relative: 47.1 % — ABNORMAL HIGH (ref 12.0–46.0)
Lymphs Abs: 3.1 10*3/uL (ref 0.7–4.0)
MCHC: 34.7 g/dL (ref 30.0–36.0)
MCV: 86.4 fl (ref 78.0–100.0)
MONO ABS: 0.8 10*3/uL (ref 0.1–1.0)
Monocytes Relative: 11.9 % (ref 3.0–12.0)
NEUTROS ABS: 2.4 10*3/uL (ref 1.4–7.7)
Neutrophils Relative %: 36.7 % — ABNORMAL LOW (ref 43.0–77.0)
PLATELETS: 244 10*3/uL (ref 150.0–400.0)
RBC: 4.99 Mil/uL (ref 4.22–5.81)
RDW: 14.9 % (ref 11.5–15.5)
WBC: 6.5 10*3/uL (ref 4.0–10.5)

## 2018-04-29 LAB — BASIC METABOLIC PANEL
BUN: 16 mg/dL (ref 6–23)
CALCIUM: 9.5 mg/dL (ref 8.4–10.5)
CO2: 27 mEq/L (ref 19–32)
Chloride: 100 mEq/L (ref 96–112)
Creatinine, Ser: 1.06 mg/dL (ref 0.40–1.50)
GFR: 95.26 mL/min (ref >60.00)
GLUCOSE: 215 mg/dL — AB (ref 70–99)
POTASSIUM: 3.8 meq/L (ref 3.5–5.1)
SODIUM: 135 meq/L (ref 135–145)

## 2018-04-29 LAB — POCT GLUCOSE (DEVICE FOR HOME USE): Glucose Fasting, POC: 218 mg/dL — AB (ref 70–99)

## 2018-04-29 LAB — HEPATIC FUNCTION PANEL
ALT: 123 U/L — AB (ref 0–53)
AST: 54 U/L — ABNORMAL HIGH (ref 0–37)
Albumin: 4.1 g/dL (ref 3.5–5.2)
Alkaline Phosphatase: 254 U/L — ABNORMAL HIGH (ref 39–117)
BILIRUBIN DIRECT: 1.5 mg/dL — AB (ref 0.0–0.3)
BILIRUBIN TOTAL: 3 mg/dL — AB (ref 0.2–1.2)
TOTAL PROTEIN: 8.4 g/dL — AB (ref 6.0–8.3)

## 2018-04-29 LAB — PROTIME-INR
INR: 1 ratio (ref 0.8–1.0)
PROTHROMBIN TIME: 11.6 s (ref 9.6–13.1)

## 2018-04-29 LAB — GAMMA GT: GGT: 656 U/L — AB (ref 7–51)

## 2018-04-29 MED ORDER — INSULIN DEGLUDEC 100 UNIT/ML ~~LOC~~ SOPN: 30.0000 [IU] | PEN_INJECTOR | Freq: Every day | SUBCUTANEOUS | 1 refills | Status: DC

## 2018-04-29 MED ORDER — INSULIN PEN NEEDLE 32G X 6 MM MISC: 1.0000 | Freq: Every day | 1 refills | Status: DC

## 2018-04-29 MED ORDER — FREESTYLE LIBRE 14 DAY READER DEVI: 1.0000 | Freq: Every day | 11 refills | Status: DC

## 2018-04-29 MED ORDER — VITAMIN B-1 100 MG PO TABS: 100.0000 mg | ORAL_TABLET | Freq: Every day | ORAL | 1 refills | Status: DC

## 2018-04-29 MED ORDER — INSULIN DEGLUDEC 200 UNIT/ML ~~LOC~~ SOPN: 30.0000 [IU] | PEN_INJECTOR | Freq: Every day | SUBCUTANEOUS | 1 refills | Status: DC

## 2018-04-29 MED ORDER — FREESTYLE LIBRE 14 DAY SENSOR MISC: 1.0000 | Freq: Every day | 11 refills | Status: DC

## 2018-04-29 NOTE — Patient Instructions (Signed)

## 2018-04-29 NOTE — Progress Notes (Signed)
Subjective:  Patient ID: [name], male    DOB: 09/06/1966  Age: 52 y.o. MRN: [id number]  CC: Anemia and Hypertension   HPI [name] presents for f/up -he was recently admitted for hyperbilirubinemia, significantly elevated LFTs, and acute kidney injury.  His gallbladder was thoroughly evaluated and it was felt that that was not the cause of his abnormal labs.  About a month prior to the symptoms a psychiatrist had started sertraline.  He had also been taking allopurinol for gout.  He tells me he is feeling much better.  He still has mild nausea, early satiety and a decrease in appetite but he denies abdominal pain or vomiting.  His itching has improved.  He has not started taking hydroxyzine or using the ammonium lactate lotion because he thought they were too expensive.  He does admit to alcohol intake.   He was told to stop taking metformin and since then has noticed that his blood sugars have been too high usually around 200.  He complains that his blood pressure is low and he has dizziness and lightheadedness but he denies polys.  Outpatient Medications Prior to Visit  Medication Sig Dispense Refill  . aspirin EC 81 MG tablet Take 1 tablet (81 mg total) by mouth daily. 90 tablet 3  . clonazePAM (KLONOPIN) 1 MG tablet TAKE 1 TABLET BY MOUTH 3 TIMES DAILY AS NEEDED FOR ANXIETY (Patient taking differently: Take 1 mg by mouth 2 (two) times daily. TAKE 1 TABLET BY MOUTH 2 TIMES DAILY CAN TAKE ANOTHER TABLET AS NEEDED FOR ANXIETY) 90 tablet 2  . hydrOXYzine (ATARAX/VISTARIL) 25 MG tablet Take 1 tablet (25 mg total) by mouth 3 (three) times daily as needed for itching. 30 tablet 0  . magnesium oxide (MAG-OX) 400 MG tablet Take 400 mg by mouth daily as needed (cramping).    . metoprolol succinate (TOPROL-XL) 100 MG 24 hr tablet Take 100 mg by mouth daily. Take with or immediately following a meal.    . NON FORMULARY Bipap, pressure 16/12 with 2L of O2    . pantoprazole (PROTONIX) 40 MG  tablet TAKE 1 TABLET (40 MG TOTAL) BY MOUTH DAILY. 90 tablet 3  . potassium chloride SA (K-DUR,KLOR-CON) 20 MEQ tablet Take 20 mEq by mouth daily as needed (cramping).    Marland Kitchen spironolactone (ALDACTONE) 25 MG tablet Take 1 tablet (25 mg total) by mouth daily. 30 tablet 6  . torsemide (DEMADEX) 20 MG tablet Take 2 tablets (40 mg total) by mouth daily. 60 tablet 11  . allopurinol (ZYLOPRIM) 100 MG tablet Take 1 tablet (100 mg total) by mouth daily. (Patient taking differently: Take 100 mg by mouth daily as needed (gout). ) 90 tablet 1  . ammonium lactate (LAC-HYDRIN) 12 % cream Apply topically as needed for dry skin. (Patient taking differently: Apply 1 g topically daily as needed for dry skin. ) 385 g 0  . losartan (COZAAR) 50 MG tablet Take 1 tablet (50 mg total) by mouth 2 (two) times daily. (Patient taking differently: Take 50 mg by mouth daily. ) 60 tablet 3  . sertraline (ZOLOFT) 100 MG tablet Take 1 tablet (100 mg total) by mouth daily. 90 tablet 0  . sildenafil (REVATIO) 20 MG tablet Take 1 tablet (20 mg total) by mouth daily as needed. (Patient taking differently: Take 20 mg by mouth daily as needed (ED). ) 15 tablet 11   No facility-administered medications prior to visit.     ROS Review of Systems  Constitutional: Positive for appetite change, fatigue and unexpected weight change. Negative for activity change, chills, diaphoresis and fever.  HENT: Negative.  Negative for trouble swallowing.   Respiratory: Negative for cough, chest tightness, shortness of breath and wheezing.   Cardiovascular: Negative for chest pain, palpitations and leg swelling.  Gastrointestinal: Positive for nausea. Negative for abdominal pain, blood in stool, constipation, diarrhea, rectal pain and vomiting.  Endocrine: Negative.  Negative for polydipsia, polyphagia and polyuria.  Genitourinary: Negative.  Negative for difficulty urinating.  Musculoskeletal: Negative.  Negative for arthralgias and myalgias.  Skin:  Negative.  Negative for color change, pallor and rash.  Neurological: Positive for dizziness and light-headedness. Negative for facial asymmetry, weakness and numbness.  Hematological: Negative for adenopathy. Does not bruise/bleed easily.  Psychiatric/Behavioral: Negative.     Objective:  BP 110/80 (BP Location: Left Arm, Patient Position: Sitting, Cuff Size: Large)   Pulse 70   Temp 98.3 F (36.8 C) (Oral)   Ht 5' 8.5" (1.74 m)   Wt (!) 413 lb 4 oz (187.4 kg)   SpO2 95%   BMI 61.92 kg/m   BP Readings from Last 3 Encounters:  04/29/18 110/80  04/24/18 120/89  04/21/18 120/88    Wt Readings from Last 3 Encounters:  04/29/18 (!) 413 lb 4 oz (187.4 kg)  04/24/18 (!) 417 lb 12.4 oz (189.5 kg)  04/21/18 (!) 411 lb (186.4 kg)    Physical Exam  Constitutional: He is oriented to person, place, and time. No distress.  HENT:  Mouth/Throat: Oropharynx is clear and moist. No oropharyngeal exudate.  Eyes: Conjunctivae are normal. Scleral icterus (very mild icterus) is present.  Neck: Normal range of motion. Neck supple. No JVD present.  Cardiovascular: Normal rate, regular rhythm and normal heart sounds.  No murmur heard. Pulmonary/Chest: Effort normal and breath sounds normal. No tachypnea. No respiratory distress. He has no decreased breath sounds. He has no wheezes. He has no rhonchi. He has no rales.  Abdominal: Soft. Bowel sounds are normal. He exhibits no distension and no mass. There is no tenderness.  I cannot detect the size of the liver and spleen due to a large abdominal pannus  Lymphadenopathy:    He has no cervical adenopathy.  Neurological: He is alert and oriented to person, place, and time.  Skin: Skin is warm and dry. No rash noted. He is not diaphoretic. No erythema. No pallor.  Skin shows xerosis and a few excoriations but no discrete lesions or ulcers.  Psychiatric: He has a normal mood and affect. His behavior is normal. Judgment and thought content normal.    Vitals reviewed.   Lab Results  Component Value Date   WBC 6.5 04/29/2018   HGB 14.9 04/29/2018   HCT 43.1 04/29/2018   PLT 244.0 04/29/2018   GLUCOSE 215 (H) 04/29/2018   CHOL 173 02/02/2018   TRIG 224.0 (H) 02/02/2018   HDL 33.60 (L) 02/02/2018   LDLDIRECT 107.0 02/02/2018   LDLCALC 89 01/15/2017   ALT 123 (H) 04/29/2018   AST 54 (H) 04/29/2018   NA 135 04/29/2018   K 3.8 04/29/2018   CL 100 04/29/2018   CREATININE 1.06 04/29/2018   BUN 16 04/29/2018   CO2 27 04/29/2018   TSH 1.89 02/02/2018   PSA 0.38 04/21/2018   INR 1.0 04/29/2018   HGBA1C 7.6 (H) 03/25/2018   MICROALBUR 0.8 02/02/2018   F-Actin IgG 0 - 19 Units 71High    Comment: (NOTE)  Negative           0 - 19          Weak positive        20 - 30          Moderate to strong positive   >30     Component 6d ago  ANA Ab, IFA Negative   Comment: (NOTE)    Mitochondrial M2 Ab, IgG 0.0 - 20.0 Units <20.0   Comment: (NOTE)      US Abdomen Complete  Result Date: 04/22/2018 CLINICAL DATA:  Elevated liver function tests, nausea and postprandial pain. EXAM: ABDOMEN ULTRASOUND COMPLETE COMPARISON:  None. FINDINGS: Limited assessment due to large body habitus. Gallbladder: Multiple echogenic gallstones measuring to 18 mm with acoustic shadowing. Gallbladder sludge. No gallbladder wall thickening or pericholecystic fluid. No sonographic Murphy's sign elicited. Common bile duct: Diameter: 3 mm Liver: No focal lesion identified. Increased parenchymal echogenicity. Portal vein is patent on color Doppler imaging with normal direction of blood flow towards the liver. IVC: No abnormality visualized. Pancreas: Pancreatic head and included body are normal. Spleen: Limited evaluation, no splenomegaly. Right Kidney: Length: 10.6 cm. Echogenicity within normal limits. No mass or hydronephrosis visualized. Left Kidney: Not sonographically identified. Abdominal aorta: No aneurysm  visualized. Other findings: None. IMPRESSION: 1. Habitus limited examination. 2. Cholelithiasis without sonographic findings of acute cholecystitis. 3. Hepatic steatosis/hepatocellular disease. Electronically Signed   By: Elon Alas M.D.   On: 04/22/2018 04:47   Mr 3d Recon At Scanner  Result Date: 04/22/2018 CLINICAL DATA:  Postprandial nausea and vomiting. Congestive heart failure. Poor appetite. Vomiting. Dark urine and abnormal blood work. Malaise. Abnormal elevated liver function tests. Cholelithiasis. Hepatic steatosis suspected on ultrasound. EXAM: MRI ABDOMEN WITHOUT AND WITH CONTRAST (INCLUDING MRCP) TECHNIQUE: Multiplanar multisequence MR imaging of the abdomen was performed both before and after the administration of intravenous contrast. Heavily T2-weighted images of the biliary and pancreatic ducts were obtained, and three-dimensional MRCP images were rendered by post processing. CONTRAST:  50m MULTIHANCE GADOBENATE DIMEGLUMINE 529 MG/ML IV SOLN COMPARISON:  Multiple exams, including 04/22/2018 and 06/12/2015 FINDINGS: Despite efforts by the technologist and patient, motion artifact is present on today's exam and could not be eliminated. This reduces exam sensitivity and specificity. Body habitus reduces diagnostic sensitivity and specificity. Lower chest: Moderate cardiomegaly. Hepatobiliary: 1.1 cm gallstone in the gallbladder. No appreciable degree of steatosis. No focal abnormal hepatic lesion. No biliary dilatation or significant irregularity or normal enhancement along the biliary tree. Pancreas:  Unremarkable Spleen:  Unremarkable Adrenals/Urinary Tract: 1.5 by 1.7 cm nodule of the left adrenal gland appears to demonstrates signal dropout on out of phase images favoring adenoma. This is not appreciably changed in size from 06/12/2015. Several tiny T2 hyperintense renal lesions are likely cysts but technically too small to characterize given the degree of motion artifact.  Stomach/Bowel: Unremarkable Vascular/Lymphatic:  Unremarkable Other:  No supplemental non-categorized findings. Musculoskeletal: Unremarkable IMPRESSION: 1. There is 1.1 cm gallstone in the gallbladder, but no appreciable hepatic steatosis or focal hepatic lesion to further explain the patient's abnormal liver enzymes. 2. Small chronically stable left adrenal nodule has an appearance favoring adrenal adenoma. 3. Cardiomegaly. Electronically Signed   By: WVan ClinesM.D.   On: 04/22/2018 15:16   Mr Abdomen Mrcp WMoise BoringContast  Result Date: 04/22/2018 CLINICAL DATA:  Postprandial nausea and vomiting. Congestive heart failure. Poor appetite. Vomiting. Dark urine and abnormal blood work. Malaise. Abnormal elevated liver function tests. Cholelithiasis. Hepatic steatosis suspected on  ultrasound. EXAM: MRI ABDOMEN WITHOUT AND WITH CONTRAST (INCLUDING MRCP) TECHNIQUE: Multiplanar multisequence MR imaging of the abdomen was performed both before and after the administration of intravenous contrast. Heavily T2-weighted images of the biliary and pancreatic ducts were obtained, and three-dimensional MRCP images were rendered by post processing. CONTRAST:  76m MULTIHANCE GADOBENATE DIMEGLUMINE 529 MG/ML IV SOLN COMPARISON:  Multiple exams, including 04/22/2018 and 06/12/2015 FINDINGS: Despite efforts by the technologist and patient, motion artifact is present on today's exam and could not be eliminated. This reduces exam sensitivity and specificity. Body habitus reduces diagnostic sensitivity and specificity. Lower chest: Moderate cardiomegaly. Hepatobiliary: 1.1 cm gallstone in the gallbladder. No appreciable degree of steatosis. No focal abnormal hepatic lesion. No biliary dilatation or significant irregularity or normal enhancement along the biliary tree. Pancreas:  Unremarkable Spleen:  Unremarkable Adrenals/Urinary Tract: 1.5 by 1.7 cm nodule of the left adrenal gland appears to demonstrates signal dropout on out  of phase images favoring adenoma. This is not appreciably changed in size from 06/12/2015. Several tiny T2 hyperintense renal lesions are likely cysts but technically too small to characterize given the degree of motion artifact. Stomach/Bowel: Unremarkable Vascular/Lymphatic:  Unremarkable Other:  No supplemental non-categorized findings. Musculoskeletal: Unremarkable IMPRESSION: 1. There is 1.1 cm gallstone in the gallbladder, but no appreciable hepatic steatosis or focal hepatic lesion to further explain the patient's abnormal liver enzymes. 2. Small chronically stable left adrenal nodule has an appearance favoring adrenal adenoma. 3. Cardiomegaly. Electronically Signed   By: WVan ClinesM.D.   On: 04/22/2018 15:16    Assessment & Plan:   DJesaiahwas seen today for anemia and hypertension.  Diagnoses and all orders for this visit:  Jaundice- All of his LFTs have improved and his INR remains normal which indicates he is not in hepatic failure.  Based on his symptoms and exam he is improving.  Of all the titers that were drawn the only one that is positive is the F-actin IgG which raises suspicion that he may have autoimmune hepatitis or drug injury.  Of the medications that he takes I am most suspicious about allopurinol, sertraline, and metformin so I have asked him to stop taking all of these.  If he needs a new antidepressant he will contact his psychiatrist.  Also, his GGT is significantly elevated which raises concern that this may be related to alcohol abuse.  He was strongly encouraged to abstain from alcohol intake. -     Protime-INR; Future -     Basic metabolic panel; Future -     Gamma GT; Future -     Hepatic function panel; Future -     CBC with Differential/Platelet; Future -     Hepatitis A antibody, total; Future -     Hepatitis B core antibody, total; Future -     Hepatitis B surface antibody; Future  Essential hypertension- His blood pressure is low and he is  symptomatic.  I have asked him to stop taking losartan.  Hyperbilirubinemia - as above -     Protime-INR; Future -     Basic metabolic panel; Future -     Gamma GT; Future -     Hepatic function panel; Future -     CBC with Differential/Platelet; Future -     Hepatitis A antibody, total; Future -     Hepatitis B core antibody, total; Future -     Hepatitis B surface antibody; Future  Type 2 diabetes mellitus with complication, with long-term current use  of insulin ([place])- He has had to stop taking metformin and is having trouble controlling his blood sugar so I have asked him to start using a basal insulin. -     Continuous Blood Gluc Receiver (FREESTYLE LIBRE 14 DAY READER) DEVI; 1 Act by Does not apply route daily. -     Continuous Blood Gluc Sensor (FREESTYLE LIBRE 14 DAY SENSOR) MISC; 1 Act by Does not apply route daily. -     POCT Glucose (Device for Home Use) -     Discontinue: insulin degludec (TRESIBA FLEXTOUCH) 100 UNIT/ML SOPN FlexTouch Pen; Inject 0.3 mLs (30 Units total) into the skin daily at 10 pm. -     Amb Referral to Nutrition and Diabetic E -     Insulin Pen Needle (NOVOFINE) 32G X 6 MM MISC; 1 Act by Does not apply route daily. -     Insulin Degludec (TRESIBA FLEXTOUCH) 200 UNIT/ML SOPN; Inject 30 Units into the skin daily.  Thiamin deficiency- He was encouraged to be compliant with the thiamine replacement therapy. -     thiamine (VITAMIN B-1) 100 MG tablet; Take 1 tablet (100 mg total) by mouth daily.   I have discontinued [name]'s sildenafil, allopurinol, losartan, sertraline, ammonium lactate, and insulin degludec. I am also having him start on FREESTYLE LIBRE 14 DAY READER, FREESTYLE LIBRE 14 DAY SENSOR, thiamine, Insulin Pen Needle, Insulin Degludec, blood glucose meter kit and supplies, glucose blood, and Lancets. Additionally, I am having him maintain his NON FORMULARY, aspirin EC, potassium chloride SA, magnesium oxide, metoprolol succinate, pantoprazole,  torsemide, clonazePAM, spironolactone, and hydrOXYzine.  Meds ordered this encounter  Medications  . Continuous Blood Gluc Receiver (FREESTYLE LIBRE 14 DAY READER) DEVI    Sig: 1 Act by Does not apply route daily.    Dispense:  1 Device    Refill:  11  . Continuous Blood Gluc Sensor (FREESTYLE LIBRE 14 DAY SENSOR) MISC    Sig: 1 Act by Does not apply route daily.    Dispense:  1 each    Refill:  11  . thiamine (VITAMIN B-1) 100 MG tablet    Sig: Take 1 tablet (100 mg total) by mouth daily.    Dispense:  90 tablet    Refill:  1  . DISCONTD: insulin degludec (TRESIBA FLEXTOUCH) 100 UNIT/ML SOPN FlexTouch Pen    Sig: Inject 0.3 mLs (30 Units total) into the skin daily at 10 pm.    Dispense:  9 mL    Refill:  1  . Insulin Pen Needle (NOVOFINE) 32G X 6 MM MISC    Sig: 1 Act by Does not apply route daily.    Dispense:  100 each    Refill:  1  . Insulin Degludec (TRESIBA FLEXTOUCH) 200 UNIT/ML SOPN    Sig: Inject 30 Units into the skin daily.    Dispense:  9 mL    Refill:  1  . blood glucose meter kit and supplies KIT    Sig: Use to check blood sugar. DX E11.9    Dispense:  1 each    Refill:  0    Dispense what insurance will cover.    Order Specific Question:   Number of strips    Answer:   10    Order Specific Question:   Number of lancets    Answer:   10  . glucose blood test strip    Sig: Use as instructed to check sugar daily. DX E11.9  Dispense:  100 each    Refill:  3  . Lancets MISC    Sig: Use to check blood sugar daily. DX E11.9    Dispense:  100 each    Refill:  3    Dispense lancet and/or device available.     Follow-up: Return in about 3 weeks (around 05/20/2018).  Scarlette Calico, MD
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
## 2018-04-30 DIAGNOSIS — R69 Illness, unspecified: Secondary | ICD-10-CM | POA: Diagnosis not present

## 2018-04-30 LAB — HEPATITIS B SURFACE ANTIBODY,QUALITATIVE: HEP B S AB: NONREACTIVE

## 2018-04-30 LAB — HEPATITIS B CORE ANTIBODY, TOTAL: HEP B C TOTAL AB: NONREACTIVE

## 2018-04-30 LAB — HEPATITIS A ANTIBODY, TOTAL: HEPATITIS A AB,TOTAL: NONREACTIVE

## 2018-04-30 MED ORDER — GLUCOSE BLOOD VI STRP: ORAL_STRIP | 3 refills | Status: DC

## 2018-04-30 MED ORDER — BLOOD GLUCOSE MONITOR KIT: PACK | 0 refills | Status: DC

## 2018-04-30 MED ORDER — LANCETS MISC: 3 refills | Status: DC

## 2018-05-01 ENCOUNTER — Encounter: Payer: Self-pay | Admitting: Internal Medicine

## 2018-05-01 DIAGNOSIS — I509 Heart failure, unspecified: Secondary | ICD-10-CM | POA: Diagnosis not present

## 2018-05-01 DIAGNOSIS — G4733 Obstructive sleep apnea (adult) (pediatric): Secondary | ICD-10-CM | POA: Diagnosis not present

## 2018-05-01 DIAGNOSIS — I10 Essential (primary) hypertension: Secondary | ICD-10-CM | POA: Diagnosis not present

## 2018-05-03 ENCOUNTER — Telehealth: Payer: Self-pay

## 2018-05-03 NOTE — Telephone Encounter (Signed)
Copied from [place] [id number]. Topic: General - Other >> Apr 30, 2018 12:21 PM Oneta Rack wrote: Osvaldo Human name: Richmond Campbell  Relation to pt: Pharmacist Pharmacy: [id number] [place], [phone](Phone) [phone](Fax)   Reason for call:  [facility] not indicating brand name but only reflecting "glucose meter", "test strips" and "lancets" kit, please advise  >> Apr 30, 2018 12:29 PM BOneta Rackwrote: COsvaldo Humanname: DRichmond Campbell Relation to pt: Pharmacist Pharmacy: [id number] [place], [phone]) [phone])   Reason for call:  [facility] not indicating brand name but only reflecting "glucose meter", "test strips" and "lancets" kit, please advise

## 2018-05-03 NOTE — Telephone Encounter (Signed)
Rx has been faxed to pharmacy.

## 2018-05-04 ENCOUNTER — Other Ambulatory Visit (HOSPITAL_COMMUNITY): Payer: Self-pay

## 2018-05-04 ENCOUNTER — Encounter (HOSPITAL_COMMUNITY): Payer: Self-pay

## 2018-05-04 ENCOUNTER — Ambulatory Visit (HOSPITAL_COMMUNITY)
Admission: RE | Admit: 2018-05-04 | Discharge: 2018-05-04 | Disposition: A | Discharge status: Home / Self Care | Payer: Medicare HMO | Source: Ambulatory Visit | Attending: Cardiology | Admitting: Cardiology

## 2018-05-04 ENCOUNTER — Other Ambulatory Visit: Payer: Self-pay

## 2018-05-04 VITALS — BP 138/80 | HR 67 | Wt >= 6400 oz

## 2018-05-04 DIAGNOSIS — I429 Cardiomyopathy, unspecified: Secondary | ICD-10-CM | POA: Insufficient documentation

## 2018-05-04 DIAGNOSIS — I5022 Chronic systolic (congestive) heart failure: Secondary | ICD-10-CM | POA: Diagnosis not present

## 2018-05-04 DIAGNOSIS — Z794 Long term (current) use of insulin: Secondary | ICD-10-CM | POA: Insufficient documentation

## 2018-05-04 DIAGNOSIS — Z79899 Other long term (current) drug therapy: Secondary | ICD-10-CM | POA: Insufficient documentation

## 2018-05-04 DIAGNOSIS — Z7982 Long term (current) use of aspirin: Secondary | ICD-10-CM | POA: Diagnosis not present

## 2018-05-04 DIAGNOSIS — I110 Hypertensive heart disease with heart failure: Secondary | ICD-10-CM | POA: Insufficient documentation

## 2018-05-04 DIAGNOSIS — E119 Type 2 diabetes mellitus without complications: Secondary | ICD-10-CM | POA: Insufficient documentation

## 2018-05-04 DIAGNOSIS — F17200 Nicotine dependence, unspecified, uncomplicated: Secondary | ICD-10-CM | POA: Diagnosis not present

## 2018-05-04 DIAGNOSIS — F17210 Nicotine dependence, cigarettes, uncomplicated: Secondary | ICD-10-CM | POA: Diagnosis not present

## 2018-05-04 DIAGNOSIS — E669 Obesity, unspecified: Secondary | ICD-10-CM | POA: Insufficient documentation

## 2018-05-04 DIAGNOSIS — I480 Paroxysmal atrial fibrillation: Secondary | ICD-10-CM | POA: Insufficient documentation

## 2018-05-04 DIAGNOSIS — F419 Anxiety disorder, unspecified: Secondary | ICD-10-CM | POA: Diagnosis not present

## 2018-05-04 DIAGNOSIS — Z6841 Body Mass Index (BMI) 40.0 and over, adult: Secondary | ICD-10-CM | POA: Diagnosis not present

## 2018-05-04 DIAGNOSIS — Z86711 Personal history of pulmonary embolism: Secondary | ICD-10-CM | POA: Diagnosis not present

## 2018-05-04 DIAGNOSIS — K219 Gastro-esophageal reflux disease without esophagitis: Secondary | ICD-10-CM | POA: Diagnosis not present

## 2018-05-04 DIAGNOSIS — G4733 Obstructive sleep apnea (adult) (pediatric): Secondary | ICD-10-CM | POA: Insufficient documentation

## 2018-05-04 DIAGNOSIS — R69 Illness, unspecified: Secondary | ICD-10-CM | POA: Diagnosis not present

## 2018-05-04 NOTE — Patient Instructions (Signed)
Your physician recommends that you schedule a follow-up appointment in: 6 weeks   Do the following things EVERYDAY: 1) Weigh yourself in the morning before breakfast. Write it down and keep it in a log. 2) Take your medicines as prescribed 3) Eat low salt foods-Limit salt (sodium) to 2000 mg per day.  4) Stay as active as you can everyday 5) Limit all fluids for the day to less than 2 liters

## 2018-05-04 NOTE — Progress Notes (Signed)
Advanced Heart Failure Clinic Note   Primary Care:Dr. Scarlette Calico Primary Cardiologist: Dr. Aundra Dubin   HPI: [name] is a 52 year old male with a past medical history of NICM, EF 25-30% in March 2018, felt to be related to prior ETOH abuse. He also has a history of PE 03/2014 completed a years course of Xarelto, morbid obesity, OSA, and tobacco abuse.   He was admitted 11/12/15-11/14/15 with acute on chronic systolic CHF and palpitations. He wore a 30 day event monitor at discharge as he had frequent PVC's and questionable Afib on telemetry. Also with some NSVT, so he was started on Amiodaone, but at follow up had not started taking it. He had previously refused ICD and was seen inpatient by Dr. Rayann Heman who felt that his morbid obesity was a prohibitive factor.   He was seen in the clinic in April 2018. He had started drinking ETOH again. Volume status was stable, he is not an Entresto candidate due to angioedema with lisinopril. Weight was 411 pounds.   Admitted 04/12/17 with SOB, chest pain. D- dimer was 1.16, chest CT without central obstructing PE, however more peripheral and subsegmental pulmonary artery branches were not confidently evaluated due to his body habitus. He was started on a heparin gtt for presumed PE, however VQ scan showed no PE. Troponin was elevated, peaked at 3.37. LHC showed no CAD. Echo showed an EF of 15%, grade 2 DD, no pericarditis. He was diuresed with IV lasix, and started on torsemide 56m at discharge. Discharge weight was 405 pounds.   Admitted 5/22 through 04/24/2018 with abdominal pain, nausea, and vomiting. 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 Did not require surgical intervention. He will have follow up with GI.   Today he returns for HF follow up. He saw PCP on 04/29/2018 and losartan was stopped. He also started insulin. Overall feeling fine. Denies SOB/PND/Orthopnea. Appetite ok. No fever or chills. Weight at home 410 pounds. Taking all medications.  Labs (5/13):  K 4.1, creatinine 1.05 Labs (1/14): K 3.8, creatinine 1.16, BNP 54 Labs (2/14): K 3.7, creatinine 1.11, BNP 28 Labs (2/16): K 4.1, creatinine 1.03, LDL 96, HCT 40 Labs (3/16): K 3.7, K 1.13, BNP 367 Labs (8/16): BNP 43, K 3.5, creatinine 1.01 Labs (08/13/15): K 3.7, creatinine 1.18, HCT 41.3 Labs (12/16): K 3.7, creatinine 1.14, TGs 495, digoxin < 0.2 Labs (2/17): K 3.8, creatinine 0.99, LDL 107, TGs 222 Labs (5/17): K 4, creatinine 1.47 Labs (2/18): K 3.9, creatinine 1.06, hgb 15.1, TGs 163, LDL 89, HDL 28, TSH normal  Labs (5/18): K 3.8, creatinine 1.12.  Labs (12/30/2017): K 3.8 Creatinine 1.25  Labs (01/28/2018): K 3.8 Creatinine 1.08 Labs 03/19/2018: K 4.1 Creatinine 1/15 BNP 212  Labs 04/29/3018: Creatinine 1.1   PMH: 1. Nonischemic cardiomyopathy: Prior cath with no significant CAD.  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  Prior echoes with EF as low as 25%.  Echo (9/13) with EF 35-40%, moderate to severe LV dilation, diffuse hypokinesis, mild MR. Echo (5/15) with EF 30-35%, moderate to severe LAE, normal RV size and systolic function.  Angioedema with ACEI, headaches with hydralazine/nitrates. Echo (3/16) with EF 25-30%, severe LV dilation, normal RV size and systolic function.  [facility]; RA mean 6, PA 33/11 mean 23, PCWP mean 13, Fick CO/CI 4.75 /1.68 (difficult study, radial artery spasm, if needs future cath would use groin).  Echo (9/16) showed EF 20-25%.   - Echo (  3/18): EF 25-30%, moderate LAE, Echo 4/18 EF 15%  2. HTN: angioedema with ACEI.  3. OSA: on Bipap 4. Morbid obesity 5. Paroxysmal atrial fibrillation: Not documented recently.   6. Smoker.  7. Anxiety/panic attacks 8. PE: 5/15, diagnosed by V/Q scan. CTA chest 8/16 negative for PE.  9. NSVT, PVCs: 30 day monitor (12/16) with PVCs, PACs, no atrial fibrillation.  10. Hematuria: Apparently had negative workup by urology.  11. ABIs  (6/16) were normal 12. 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  13. Gout 14. Low back pain.   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  Has son and daughter.    FH: No premature CAD.    ROS: All systems reviewed and negative except as per HPI.   Past Medical History:  Diagnosis Date  . Alcohol abuse   . Anxiety state, unspecified   . Atrial fibrillation ([place])   . CHF (congestive heart failure) ([place])   . Chronic systolic heart failure ([place])   . Diabetes mellitus, type II ([place])   . Edema   . History of medication noncompliance   . Migraine   . Obesity, unspecified   . Obstructive sleep apnea   . Psychiatric disorder   . Pulmonary embolism ([place])   . Shortness of breath     Current Outpatient Medications  Medication Sig Dispense Refill  . aspirin EC 81 MG tablet Take 1 tablet (81 mg total) by mouth daily. 90 tablet 3  . clonazePAM (KLONOPIN) 1 MG tablet TAKE 1 TABLET BY MOUTH 3 TIMES DAILY AS NEEDED FOR ANXIETY (Patient taking differently: Take 1 mg by mouth 2 (two) times daily. TAKE 1 TABLET BY MOUTH 2 TIMES DAILY CAN TAKE ANOTHER TABLET AS NEEDED FOR ANXIETY) 90 tablet 2  . Continuous Blood Gluc Receiver (FREESTYLE LIBRE 14 DAY READER) DEVI 1 Act by Does not apply route daily. 1 Device 11  . Continuous Blood Gluc Sensor (FREESTYLE LIBRE 14 DAY SENSOR) MISC 1 Act by Does not apply route daily. 1 each 11  . glucose blood test strip Use as instructed to check sugar daily. DX E11.9 100 each 3  . hydrOXYzine (ATARAX/VISTARIL) 25 MG tablet Take 1 tablet (25 mg total) by mouth 3 (three) times daily as needed for itching. 30 tablet 0  . Insulin Degludec (TRESIBA FLEXTOUCH) 200 UNIT/ML SOPN Inject 30 Units into the skin daily. 9 mL 1  . Insulin Pen Needle (NOVOFINE) 32G X 6 MM MISC 1 Act by Does not apply route daily. 100 each 1  . Lancets MISC Use to check blood sugar daily. DX E11.9 100 each 3  . magnesium oxide (MAG-OX) 400 MG  tablet Take 400 mg by mouth daily as needed (cramping).    . metoprolol succinate (TOPROL-XL) 100 MG 24 hr tablet Take 100 mg by mouth daily. Take with or immediately following a meal.    . NON FORMULARY Bipap, pressure 16/12 with 2L of O2    . pantoprazole (PROTONIX) 40 MG tablet TAKE 1 TABLET (40 MG TOTAL) BY MOUTH DAILY. 90 tablet 3  . potassium chloride SA (K-DUR,KLOR-CON) 20 MEQ tablet Take 20 mEq by mouth daily as needed (cramping).    Marland Kitchen spironolactone (ALDACTONE) 25 MG tablet Take 1 tablet (25 mg total) by mouth daily. 30 tablet 6  . thiamine (VITAMIN B-1) 100 MG tablet Take 1 tablet (100 mg total) by mouth daily. 90 tablet 1  . torsemide (DEMADEX) 20 MG tablet Take  2 tablets (40 mg total) by mouth daily. (Patient taking differently: Take 20 mg by mouth daily. ) 60 tablet 11  . blood glucose meter kit and supplies KIT Use to check blood sugar. DX E11.9 1 each 0   No current facility-administered medications for this encounter.     Allergies  Allergen Reactions  . Ace Inhibitors Anaphylaxis and Swelling  . Buspirone Other (See Comments)    dizziness      Social History   Socioeconomic History  . Marital status: Married    Spouse name: Not on file  . Number of children: 3  . Years of education: Not on file  . Highest education level: Not on file  Occupational History  . Occupation: DISABLED  Social Needs  . Financial resource strain: Not on file  . Food insecurity:    Worry: Not on file    Inability: Not on file  . Transportation needs:    Medical: Not on file    Non-medical: Not on file  Tobacco Use  . Smoking status: Current Every Day Smoker    Packs/day: 0.50    Years: 30.00    Pack years: 15.00    Types: Cigarettes  . Smokeless tobacco: Never Used  . Tobacco comment: Took information today to call for help with support to quit   Substance and Sexual Activity  . Alcohol use: Yes    Alcohol/week: 0.0 oz    Comment: Reports none in over a month, trying to quit    . Drug use: No  . Sexual activity: Not Currently  Lifestyle  . Physical activity:    Days per week: Not on file    Minutes per session: Not on file  . Stress: Not on file  Relationships  . Social connections:    Talks on phone: Not on file    Gets together: Not on file    Attends religious service: Not on file    Active member of club or organization: Not on file    Attends meetings of clubs or organizations: Not on file    Relationship status: Not on file  . Intimate partner violence:    Fear of current or ex partner: Not on file    Emotionally abused: Not on file    Physically abused: Not on file    Forced sexual activity: Not on file  Other Topics Concern  . Not on file  Social History Narrative   He smokes about a pack per day and he has been smoking since he was 52 years of age.  He drinks alcohol occasionally, but he denies any illicit drug abuse.  He is presently on disability.    Lives with wife in a 2 story home.  Has 2 children.   Previously worked in [employer], last worked in 1998.   Highest level of education:  11th grade              Family History  Problem Relation Age of Onset  . Cancer Mother        brain tumor  . Hypertension Mother   . Diabetes Father        Deceased, 36  . Heart disease Maternal Grandmother   . Hypertension Unknown        Family History  . Stroke Unknown        Family History  . Diabetes Unknown        Family History  . Diabetes Daughter     Vitals:  05/04/18 1154  BP: 138/80  Pulse: 67  SpO2: 98%  Weight: (!) 418 lb 12.8 oz (190 kg)   Filed Weights   05/04/18 1154  Weight: (!) 418 lb 12.8 oz (190 kg)   Wt Readings from Last 3 Encounters:  05/04/18 (!) 418 lb 12.8 oz (190 kg)  04/29/18 (!) 413 lb 4 oz (187.4 kg)  04/24/18 (!) 417 lb 12.4 oz (189.5 kg)      PHYSICAL EXAM: General:  Well appearing. No resp difficulty HEENT: normal Neck: supple. no JVD. Carotids 2+ bilat; no bruits. No lymphadenopathy or  thryomegaly appreciated. Cor: PMI nondisplaced. Regular rate & rhythm. No rubs, gallops or murmurs. Lungs: clear Abdomen: obese, soft, nontender, nondistended. No hepatosplenomegaly. No bruits or masses. Good bowel sounds. Extremities: no cyanosis, clubbing, rash, edema Neuro: alert & orientedx3, cranial nerves grossly intact. moves all 4 extremities w/o difficulty. Affect pleasant  ASSESSMENT & PLAN: 1. Chronic systolic CHF: Nonischemic cardiomyopathy. Echo (3/18) with EF 25-30%. He was seen by EP and decided against ICD given marked obesity.  QRS not wide enough for CRT. Echo 4/18 EF 15%.  NYHA II-III. Volume status stable. Continue torsemide 20 mg daily.  - Continue Spiro 25 mg daily - Continue Toprol XL 183m  -PCP stopped losartan and he refuses to re challenge.  -No entresto with h/o angioedema.  Had side effects from digoxin, does not want to take. - Headaches with Bidil, cannot take.   2. Obesity:  Body mass index is 62.75 kg/m.  He has information to pursue weight loss surgery. Today he met with our exercise physiologist for home exercise program.  3. Smoking:  -discussed smoking cessation.  4. OSA:  - Continue nightly 5. Paroxysmal atrial fibrillation:  Regular pulse.  Has not been anticoagulated in 3 years. He does not want start anticoagulatns f 6. PE: 04/14/2018 , diagnosed by V/Q scan.  He was on Xarelto for about a year but stopped in the setting of hematuria.  - Recent hospitalization without PE on CT and VQ scan. Not on anticoagulation. Does not want to anticoagulants.  7. Anxiety - Has follow up with psychiatry.   8. GERD  He does not want to rechallenge losartan for now. Continue Paramedicine. Difficult to manage due to poor insight.   Follow up in 6 weeks     ADarrick Grinder NP 05/04/18

## 2018-05-04 NOTE — Progress Notes (Signed)
Paramedicine Encounter   Patient ID: [name] , male,   DOB: 08/04/1966,51 y.o.,  MRN: [id number]  Adam [name] was seen at the clinic today with [name], NP. He reported that his PCP has taken him off Losartan because of "elevated liver enzymes." They also stopped his metformin and put him on insulin injections which he stated he was not planning on being complaint with because he does not want to "stick needles in [his] stomach." Amy warned him about non compliance with his diabetic medications and he seemed dismissive. Amy also stated she wanted to get him back on Losartan since it does not have an impact on his liver but [name] advised that he was not going to take it for at least the 6 weeks that his PCP advised him. Amy ensured him that the Losartan was not to blame for the problems with his liver but he insisted that he would not take it.  No changes were able to made during this visit due to [name] reluctance to follow Amy's instructions.   Time spent with patient: 33 minutes  Jacquiline Doe, EMT 05/04/2018   ACTION: Next visit planned for 1 week

## 2018-05-07 DIAGNOSIS — G4733 Obstructive sleep apnea (adult) (pediatric): Secondary | ICD-10-CM | POA: Diagnosis not present

## 2018-05-11 ENCOUNTER — Telehealth (HOSPITAL_COMMUNITY): Payer: Self-pay

## 2018-05-11 ENCOUNTER — Ambulatory Visit (HOSPITAL_COMMUNITY): Payer: Medicare HMO | Admitting: Psychiatry

## 2018-05-12 ENCOUNTER — Telehealth (HOSPITAL_COMMUNITY): Payer: Self-pay

## 2018-05-12 NOTE — Telephone Encounter (Signed)
I called Mr [name] to schedule an appointment. He did not answer and no voicemail was available to leave a message.

## 2018-05-12 NOTE — Telephone Encounter (Signed)
I called [name] to schedule an appointment. He did not answer the phone and no voicemail was available to leave a message.

## 2018-05-13 ENCOUNTER — Telehealth (HOSPITAL_COMMUNITY): Payer: Self-pay

## 2018-05-13 NOTE — Telephone Encounter (Signed)
I called [name] to schedule an appointment. He did not answer and his voicemail box was full so no message could be left.

## 2018-05-17 ENCOUNTER — Telehealth: Payer: Self-pay | Admitting: Internal Medicine

## 2018-05-17 NOTE — Telephone Encounter (Signed)
Copied from [place] [phone]. Topic: Quick Communication - See Telephone Encounter >> May 17, 2018  1:43 PM Synthia Innocent wrote: CRM for notification. See Telephone encounter for: 05/17/18.patient calling and stating that hydrOXYzine (ATARAX/VISTARIL) 25 MG tablet is not working, still itching, more so at night .  Has tried soaps and creams provider suggest, nothing working

## 2018-05-18 ENCOUNTER — Other Ambulatory Visit: Payer: Self-pay | Admitting: Internal Medicine

## 2018-05-18 ENCOUNTER — Other Ambulatory Visit: Payer: Self-pay | Admitting: Family

## 2018-05-18 MED ORDER — CHOLESTYRAMINE 4 GM/DOSE PO POWD: 4.0000 g | Freq: Two times a day (BID) | ORAL | 3 refills | Status: DC

## 2018-05-18 MED ORDER — CHOLESTYRAMINE LIGHT 4 G PO PACK: 4.0000 g | PACK | Freq: Two times a day (BID) | ORAL | 3 refills | Status: DC

## 2018-05-18 NOTE — Telephone Encounter (Signed)
Start cholestyramine RX sent

## 2018-05-18 NOTE — Telephone Encounter (Signed)
Try again

## 2018-05-18 NOTE — Telephone Encounter (Signed)
Is there an alternative medication for itching? Pt is not getting much relief with the hydroxyzine.

## 2018-05-18 NOTE — Telephone Encounter (Signed)
Called pt to inform rx has been sent and pt stated that the rx was too expensive.

## 2018-05-19 ENCOUNTER — Other Ambulatory Visit (HOSPITAL_COMMUNITY): Payer: Self-pay

## 2018-05-19 ENCOUNTER — Ambulatory Visit: Payer: Self-pay | Admitting: Internal Medicine

## 2018-05-19 ENCOUNTER — Telehealth (HOSPITAL_COMMUNITY): Payer: Self-pay

## 2018-05-19 DIAGNOSIS — R748 Abnormal levels of other serum enzymes: Secondary | ICD-10-CM | POA: Diagnosis not present

## 2018-05-19 DIAGNOSIS — R932 Abnormal findings on diagnostic imaging of liver and biliary tract: Secondary | ICD-10-CM | POA: Diagnosis not present

## 2018-05-19 MED ORDER — METOPROLOL SUCCINATE ER 100 MG PO TB24: 100.0000 mg | ORAL_TABLET | Freq: Every day | ORAL | 3 refills | Status: DC

## 2018-05-19 NOTE — Telephone Encounter (Signed)
While agent giving me transfer information he hung up/got disconnected.

## 2018-05-19 NOTE — Telephone Encounter (Signed)
I called [name] to see if he was available for an appointment. He did not answer and his voicemail was full so no message was able to be left.

## 2018-05-19 NOTE — Telephone Encounter (Signed)
I called Mr [name] to schedule an appointment. He stated that today he was going to be busy with a doctor's appointment until 14:00 and I could call him then. He stated he has not been calling me back or answering my calls lately because he was feeling down. He said this is because  When his PCP told him to stop a number of medications, one of them was zoloft, which he had recently started for depression. I asked that he contact his PCP and left him know that he is feeling down and see what they advise regarding his antidepressant. Mr [name] also stated his blood sugar was running high lately so I told him to tell his PCP about this as well.

## 2018-05-19 NOTE — Telephone Encounter (Signed)
Mr Cadden called me back and stated he had fallen asleep after cpoming home from his doctors appointment which is why he missed my phone call. He said he would be free tomorrow so we agreed to meet at 12:30.

## 2018-05-20 ENCOUNTER — Other Ambulatory Visit (HOSPITAL_COMMUNITY): Payer: Self-pay

## 2018-05-20 NOTE — Progress Notes (Signed)
Paramedicine Encounter    Patient ID: [name], male    DOB: 1966/09/25, 52 y.o.   MRN: [id number]   Patient Care Team: [name], MD as PCP - General (Internal Medicine) [name], MD as PCP - Cardiology (Cardiology) [name], MD as Consulting Physician (Cardiology)  Patient Active Problem List   Diagnosis Date Noted  . Thiamin deficiency 04/29/2018  . Jaundice 04/22/2018  . Cholelithiasis 04/22/2018  . CKD (chronic kidney disease), stage II 04/22/2018  . Hyperbilirubinemia   . Routine general medical examination at a health care facility 02/03/2018  . Idiopathic gout 02/02/2018  . PE (pulmonary thromboembolism) ([place]) 04/12/2017  . Hereditary and idiopathic peripheral neuropathy 07/16/2016  . Paroxysmal atrial fibrillation (H[facility])   . Chronic anticoagulation, secondary to PE 03/2014 12/27/2014  . Osteoarthritis of both knees 12/27/2014  . NSVT (nonsustained ventricular tachycardia) ([place]) 09/26/2013  . Alcohol abuse 08/10/2013  . Hyperlipidemia with target LDL less than 100 06/29/2013  . Nonischemic cardiomyopathy ([place]) 01/26/2013  . Type II diabetes mellitus with manifestations ([place]) 04/13/2012  . Tobacco user 02/18/2012  . Migraine 08/10/2011  . ED (erectile dysfunction) 07/11/2011  . GAD (generalized anxiety disorder) 06/12/2010  . Morbid obesity ([place]) 06/11/2010  . Essential hypertension 06/11/2010  . Chronic combined systolic and diastolic CHF (congestive heart failure) ([place]) 06/11/2010  . Esophageal reflux 06/11/2010  . OSA (obstructive sleep apnea) with BiPap and oxygen 06/11/2010    Current Outpatient Medications:  .  aspirin EC 81 MG tablet, Take 1 tablet (81 mg total) by mouth daily., Disp: 90 tablet, Rfl: 3 .  blood glucose meter kit and supplies KIT, Use to check blood sugar. DX E11.9, Disp: 1 each, Rfl: 0 .  clonazePAM (KLONOPIN) 1 MG tablet, TAKE 1 TABLET BY MOUTH 3 TIMES DAILY AS NEEDED FOR ANXIETY (Patient taking differently: Take 1  mg by mouth 2 (two) times daily. TAKE 1 TABLET BY MOUTH 2 TIMES DAILY CAN TAKE ANOTHER TABLET AS NEEDED FOR ANXIETY), Disp: 90 tablet, Rfl: 2 .  glucose blood test strip, Use as instructed to check sugar daily. DX E11.9, Disp: 100 each, Rfl: 3 .  hydrOXYzine (ATARAX/VISTARIL) 25 MG tablet, Take 1 tablet (25 mg total) by mouth 3 (three) times daily as needed for itching., Disp: 30 tablet, Rfl: 0 .  Insulin Degludec (TRESIBA FLEXTOUCH) 200 UNIT/ML SOPN, Inject 30 Units into the skin daily., Disp: 9 mL, Rfl: 1 .  Insulin Pen Needle (NOVOFINE) 32G X 6 MM MISC, 1 Act by Does not apply route daily., Disp: 100 each, Rfl: 1 .  Lancets MISC, Use to check blood sugar daily. DX E11.9, Disp: 100 each, Rfl: 3 .  metoprolol succinate (TOPROL-XL) 100 MG 24 hr tablet, Take 1 tablet (100 mg total) by mouth daily. Take with or immediately following a meal., Disp: 90 tablet, Rfl: 3 .  NON FORMULARY, Bipap, pressure 16/12 with 2L of O2, Disp: , Rfl:  .  pantoprazole (PROTONIX) 40 MG tablet, TAKE 1 TABLET (40 MG TOTAL) BY MOUTH DAILY., Disp: 90 tablet, Rfl: 3 .  potassium chloride SA (K-DUR,KLOR-CON) 20 MEQ tablet, Take 20 mEq by mouth daily as needed (cramping)., Disp: , Rfl:  .  spironolactone (ALDACTONE) 25 MG tablet, Take 1 tablet (25 mg total) by mouth daily., Disp: 30 tablet, Rfl: 6 .  thiamine (VITAMIN B-1) 100 MG tablet, Take 1 tablet (100 mg total) by mouth daily., Disp: 90 tablet, Rfl: 1 .  torsemide (DEMADEX) 20 MG tablet, Take 2 tablets (  40 mg total) by mouth daily. (Patient taking differently: Take 20 mg by mouth daily. ), Disp: 60 tablet, Rfl: 11 .  cholestyramine (QUESTRAN) 4 GM/DOSE powder, Take 1 packet (4 g total) by mouth 2 (two) times daily with a meal. (Patient not taking: Reported on 05/20/2018), Disp: 378 g, Rfl: 3 .  Continuous Blood Gluc Receiver (FREESTYLE LIBRE 14 DAY READER) DEVI, 1 Act by Does not apply route daily., Disp: 1 Device, Rfl: 11 .  Continuous Blood Gluc Sensor (FREESTYLE LIBRE 14  DAY SENSOR) MISC, 1 Act by Does not apply route daily., Disp: 1 each, Rfl: 11 .  magnesium oxide (MAG-OX) 400 MG tablet, Take 400 mg by mouth daily as needed (cramping)., Disp: , Rfl:  Allergies  Allergen Reactions  . Ace Inhibitors Anaphylaxis and Swelling  . Buspirone Other (See Comments)    dizziness      Social History   Socioeconomic History  . Marital status: Married    Spouse name: Not on file  . Number of children: 3  . Years of education: Not on file  . Highest education level: Not on file  Occupational History  . Occupation: DISABLED  Social Needs  . Financial resource strain: Not on file  . Food insecurity:    Worry: Not on file    Inability: Not on file  . Transportation needs:    Medical: Not on file    Non-medical: Not on file  Tobacco Use  . Smoking status: Current Every Day Smoker    Packs/day: 0.50    Years: 30.00    Pack years: 15.00    Types: Cigarettes  . Smokeless tobacco: Never Used  . Tobacco comment: Took information today to call for help with support to quit   Substance and Sexual Activity  . Alcohol use: Yes    Alcohol/week: 0.0 oz    Comment: Reports none in over a month, trying to quit   . Drug use: No  . Sexual activity: Not Currently  Lifestyle  . Physical activity:    Days per week: Not on file    Minutes per session: Not on file  . Stress: Not on file  Relationships  . Social connections:    Talks on phone: Not on file    Gets together: Not on file    Attends religious service: Not on file    Active member of club or organization: Not on file    Attends meetings of clubs or organizations: Not on file    Relationship status: Not on file  . Intimate partner violence:    Fear of current or ex partner: Not on file    Emotionally abused: Not on file    Physically abused: Not on file    Forced sexual activity: Not on file  Other Topics Concern  . Not on file  Social History Narrative   He smokes about a pack per day and he has  been smoking since he was 52 years of age.  He drinks alcohol occasionally, but he denies any illicit drug abuse.  He is presently on disability.    Lives with wife in a 2 story home.  Has 2 children.   Previously worked in [employer], last worked in 1998.   Highest level of education:  11th grade            Physical Exam  Constitutional: He is oriented to person, place, and time.  Cardiovascular: Normal rate and regular rhythm.  Pulmonary/Chest: Effort normal and  breath sounds normal.  Abdominal: Soft.  Musculoskeletal: Normal range of motion. He exhibits edema.  Neurological: He is alert and oriented to person, place, and time.  Skin: Skin is warm and dry.  Psychiatric: He has a normal mood and affect.        Future Appointments  Date Time Provider [place]  05/25/2018 11:00 AM [name], MD [facility]  06/07/2018 11:00 AM [facility] 1-BU[facility]  06/07/2018 12:00 PM [name], MD [facility] None  06/23/2018 11:30 AM [facility] PA/NP [facility] None    BP 110/90 (BP Location: Left Arm, Patient Position: Sitting, Cuff Size: Large)   Pulse 70   Resp 16   Wt (!) 402 lb 12.8 oz (182.7 kg)   SpO2 98%   BMI 60.35 kg/m   Weight yesterday- 405 lb Last visit weight- 418.8 lb  Mr Liddy was seen at home today. Upon my arrival he was sitting in the garage smoking a cigarette and drinking a brown liquid out of a bottle. He stated the liquid was a mixture of ginger root, lemon juice, cinnamon, and apple cider vinegar. I asked why he was drinking it and he said it was to lower his blood pressure. He is still not taking Losartan. He also is not taking metformin or zoloft. He stated his PCP advised him to stop these medications when his liver enzymes were found to be elevated. He told me that he is taking everything else on his medication list but does not want to use a pillbox because he can keep up with his pills without it. He seems to be depressed which I explained was  likely because he was taken off the antidepressant however he could help himself by going outside and sitting in the sun instead of his living room with the shades drawn. He did not find this to be a satisfactory answer to his depression so I encouraged him to speak to his PCP when he sees them next week. He also complained of an uneven urinary stream and incomplete emptying of his bladder. I advised he speak to his PCP about this and request a urology consult. He said he has seen a urologist in the past but "they wanted to stick a tube up [his penis]" so he did not want to go that route. I explained to him that if he has a complaint but is not willing to go through with the diagnostic testing then it is impossible to offer him assistance with said complaint. He said he understood and asked that I write down the questions he should ask his PCP. His medications were verified and he was asked to contact me after he speaks to his doctor next week.   Jacquiline Doe, EMT 05/20/18  ACTION: Home visit completed Next visit planned for 1 week

## 2018-05-20 NOTE — Telephone Encounter (Signed)
Tried to call pt but mailbox is full.

## 2018-05-21 NOTE — Telephone Encounter (Signed)
Pt contacted. Pt stated that the rx is still too expensive but he is not itching as bad.

## 2018-05-25 ENCOUNTER — Ambulatory Visit (INDEPENDENT_AMBULATORY_CARE_PROVIDER_SITE_OTHER): Payer: Medicare HMO | Admitting: Internal Medicine

## 2018-05-25 ENCOUNTER — Encounter: Payer: Self-pay | Admitting: Internal Medicine

## 2018-05-25 VITALS — BP 128/80 | HR 63 | Temp 97.8°F | Resp 16 | Ht 68.5 in | Wt >= 6400 oz

## 2018-05-25 DIAGNOSIS — Z794 Long term (current) use of insulin: Secondary | ICD-10-CM | POA: Diagnosis not present

## 2018-05-25 DIAGNOSIS — B309 Viral conjunctivitis, unspecified: Secondary | ICD-10-CM

## 2018-05-25 DIAGNOSIS — Z23 Encounter for immunization: Secondary | ICD-10-CM

## 2018-05-25 DIAGNOSIS — E118 Type 2 diabetes mellitus with unspecified complications: Secondary | ICD-10-CM | POA: Diagnosis not present

## 2018-05-25 LAB — POCT GLYCOSYLATED HEMOGLOBIN (HGB A1C): HEMOGLOBIN A1C: 8.7 % — AB (ref 4.0–5.6)

## 2018-05-25 LAB — POCT GLUCOSE (DEVICE FOR HOME USE): Glucose Fasting, POC: 287 mg/dL — AB (ref 70–99)

## 2018-05-25 LAB — HEMOGLOBIN A1C
HEMOGLOBIN A1C: 8.7
HEMOGLOBIN A1C: 8.7

## 2018-05-25 LAB — BASIC METABOLIC PANEL: GLUCOSE: 287

## 2018-05-25 MED ORDER — TOBRAMYCIN-DEXAMETHASONE 0.3-0.05 % OP SUSP: 1.0000 [drp] | Freq: Three times a day (TID) | OPHTHALMIC | 0 refills | Status: DC

## 2018-05-25 MED ORDER — SITAGLIP PHOS-METFORMIN HCL ER 50-1000 MG PO TB24: 2.0000 | ORAL_TABLET | Freq: Every day | ORAL | 1 refills | Status: DC

## 2018-05-25 NOTE — Progress Notes (Signed)
Subjective:  Patient ID: [name], male    DOB: 19-Aug-1966  Age: 52 y.o. MRN: [id number]  CC: Diabetes and Conjunctivitis   HPI [name] presents for f/up -  He saw a liver doctor a week ago and was told that his liver enzymes were normalizing, now down into the 56s.  He is feeling much better.  He has had a normal appetite and has gained some weight.  He denies abdominal pain, icterus, nausea, vomiting, fever, or chills.  He said the liver doctor told him he is not sure yet what caused his liver damage from a month ago.  He complains of a one-week history of itchy, red, watery eyes with matting.  He also complains that he is having a hard time keeping his blood sugars down.  He has had a few spikes up into the 300s.  He is using the basal insulin.  Outpatient Medications Prior to Visit  Medication Sig Dispense Refill  . aspirin EC 81 MG tablet Take 1 tablet (81 mg total) by mouth daily. 90 tablet 3  . blood glucose meter kit and supplies KIT Use to check blood sugar. DX E11.9 1 each 0  . clonazePAM (KLONOPIN) 1 MG tablet TAKE 1 TABLET BY MOUTH 3 TIMES DAILY AS NEEDED FOR ANXIETY (Patient taking differently: Take 1 mg by mouth 2 (two) times daily. TAKE 1 TABLET BY MOUTH 2 TIMES DAILY CAN TAKE ANOTHER TABLET AS NEEDED FOR ANXIETY) 90 tablet 2  . glucose blood test strip Use as instructed to check sugar daily. DX E11.9 100 each 3  . Insulin Degludec (TRESIBA FLEXTOUCH) 200 UNIT/ML SOPN Inject 30 Units into the skin daily. 9 mL 1  . Insulin Pen Needle (NOVOFINE) 32G X 6 MM MISC 1 Act by Does not apply route daily. 100 each 1  . Lancets MISC Use to check blood sugar daily. DX E11.9 100 each 3  . magnesium oxide (MAG-OX) 400 MG tablet Take 400 mg by mouth daily as needed (cramping).    . metoprolol succinate (TOPROL-XL) 100 MG 24 hr tablet Take 1 tablet (100 mg total) by mouth daily. Take with or immediately following a meal. 90 tablet 3  . NON FORMULARY Bipap, pressure 16/12 with  2L of O2    . pantoprazole (PROTONIX) 40 MG tablet TAKE 1 TABLET (40 MG TOTAL) BY MOUTH DAILY. 90 tablet 3  . potassium chloride SA (K-DUR,KLOR-CON) 20 MEQ tablet Take 20 mEq by mouth daily as needed (cramping).    Marland Kitchen spironolactone (ALDACTONE) 25 MG tablet Take 1 tablet (25 mg total) by mouth daily. 30 tablet 6  . thiamine (VITAMIN B-1) 100 MG tablet Take 1 tablet (100 mg total) by mouth daily. 90 tablet 1  . torsemide (DEMADEX) 20 MG tablet Take 2 tablets (40 mg total) by mouth daily. (Patient taking differently: Take 20 mg by mouth daily. ) 60 tablet 11  . cholestyramine (QUESTRAN) 4 GM/DOSE powder Take 1 packet (4 g total) by mouth 2 (two) times daily with a meal. (Patient not taking: Reported on 05/20/2018) 378 g 3  . Continuous Blood Gluc Receiver (FREESTYLE LIBRE 14 DAY READER) DEVI 1 Act by Does not apply route daily. 1 Device 11  . Continuous Blood Gluc Sensor (FREESTYLE LIBRE 14 DAY SENSOR) MISC 1 Act by Does not apply route daily. 1 each 11  . hydrOXYzine (ATARAX/VISTARIL) 25 MG tablet Take 1 tablet (25 mg total) by mouth 3 (three) times daily as needed for itching. [place]  tablet 0   No facility-administered medications prior to visit.     ROS Review of Systems  Constitutional: Negative.  Negative for appetite change, chills, diaphoresis and fatigue.  HENT: Negative.   Eyes: Positive for discharge, redness and itching. Negative for photophobia, pain and visual disturbance.  Respiratory: Negative for cough, chest tightness, shortness of breath and wheezing.   Cardiovascular: Negative for chest pain, palpitations and leg swelling.  Gastrointestinal: Negative for abdominal pain, constipation, diarrhea, nausea and vomiting.  Endocrine: Positive for polydipsia and polyphagia. Negative for polyuria.  Genitourinary: Negative.  Negative for difficulty urinating.  Musculoskeletal: Negative.  Negative for arthralgias and myalgias.  Skin: Negative.  Negative for color change and rash.    Neurological: Negative.  Negative for dizziness, weakness and light-headedness.  Hematological: Negative for adenopathy. Does not bruise/bleed easily.  Psychiatric/Behavioral: Negative.     Objective:  BP 128/80 (BP Location: Left Arm, Patient Position: Sitting, Cuff Size: Large)   Pulse 63   Temp 97.8 F (36.6 C) (Oral)   Resp 16   Ht 5' 8.5" (1.74 m)   Wt (!) 411 lb 4 oz (186.5 kg)   SpO2 97%   BMI 61.62 kg/m   BP Readings from Last 3 Encounters:  05/25/18 128/80  05/20/18 110/90  05/04/18 138/80    Wt Readings from Last 3 Encounters:  05/25/18 (!) 411 lb 4 oz (186.5 kg)  05/20/18 (!) 402 lb 12.8 oz (182.7 kg)  05/04/18 (!) 418 lb 12.8 oz (190 kg)    Physical Exam  Constitutional: He is oriented to person, place, and time. No distress.  HENT:  Mouth/Throat: Oropharynx is clear and moist. No oropharyngeal exudate.  Eyes: Lids are normal. Lids are everted and swept, no foreign bodies found. Right eye exhibits no chemosis, no discharge, no exudate and no hordeolum. No foreign body present in the right eye. Left eye exhibits no chemosis, no discharge, no exudate and no hordeolum. No foreign body present in the left eye. Right conjunctiva is injected. Right conjunctiva has no hemorrhage. Left conjunctiva is injected. Left conjunctiva has no hemorrhage. No scleral icterus.    Neck: Normal range of motion. Neck supple. No JVD present. No thyromegaly present.  Cardiovascular: Normal rate, regular rhythm and normal heart sounds. Exam reveals no friction rub.  No murmur heard. Pulmonary/Chest: Effort normal and breath sounds normal. No respiratory distress. He has no wheezes. He has no rales.  Abdominal: Soft. Normal appearance and bowel sounds are normal. He exhibits no mass. There is no hepatosplenomegaly. There is no tenderness. There is no rigidity and no guarding. No hernia.  Musculoskeletal: Normal range of motion. He exhibits no edema, tenderness or deformity.   Lymphadenopathy:    He has no cervical adenopathy.  Neurological: He is alert and oriented to person, place, and time.  Skin: Skin is warm and dry. No rash noted. He is not diaphoretic.  Vitals reviewed.   Lab Results  Component Value Date   WBC 6.5 04/29/2018   HGB 14.9 04/29/2018   HCT 43.1 04/29/2018   PLT 244.0 04/29/2018   GLUCOSE 215 (H) 04/29/2018   CHOL 173 02/02/2018   TRIG 224.0 (H) 02/02/2018   HDL 33.60 (L) 02/02/2018   LDLDIRECT 107.0 02/02/2018   LDLCALC 89 01/15/2017   ALT 123 (H) 04/29/2018   AST 54 (H) 04/29/2018   NA 135 04/29/2018   K 3.8 04/29/2018   CL 100 04/29/2018   CREATININE 1.06 04/29/2018   BUN 16 04/29/2018   CO2 27  04/29/2018   TSH 1.89 02/02/2018   PSA 0.38 04/21/2018   INR 1.0 04/29/2018   HGBA1C 8.7 (A) 05/25/2018   MICROALBUR 0.8 02/02/2018    No results found.  Assessment & Plan:   [name] was seen today for diabetes and conjunctivitis.  Diagnoses and all orders for this visit:  Type 2 diabetes mellitus with complication, with long-term current use of insulin ([place])- His A1c is up to 8.7%.  Will continue the basal insulin.  Will add back a DPP4 inhibitor and metformin as I do not think these contributed to his hepatitis. -     POCT Glucose (Device for Home Use) -     SitaGLIPtin-MetFORMIN HCl (JANUMET XR) 50-1000 MG TB24; Take 2 tablets by mouth daily. -     POCT glycosylated hemoglobin (Hb A1C)  Acute viral conjunctivitis of both eyes -     Tobramycin-Dexamethasone (TOBRADEX ST) 0.3-0.05 % SUSP; Apply 1 drop to eye 3 (three) times daily.  Need for hepatitis A and B vaccination -     Hepatitis A hepatitis B combined vaccine IM   I have discontinued Devontae L. Righetti's hydrOXYzine, FREESTYLE LIBRE 14 DAY READER, FREESTYLE LIBRE 14 DAY SENSOR, and cholestyramine. I am also having him start on Tobramycin-Dexamethasone and SitaGLIPtin-MetFORMIN HCl. Additionally, I am having him maintain his NON FORMULARY, aspirin EC, potassium  chloride SA, magnesium oxide, pantoprazole, torsemide, clonazePAM, spironolactone, thiamine, Insulin Pen Needle, Insulin Degludec, blood glucose meter kit and supplies, glucose blood, Lancets, and metoprolol succinate.  Meds ordered this encounter  Medications  . Tobramycin-Dexamethasone (TOBRADEX ST) 0.3-0.05 % SUSP    Sig: Apply 1 drop to eye 3 (three) times daily.    Dispense:  5 mL    Refill:  0  . SitaGLIPtin-MetFORMIN HCl (JANUMET XR) 50-1000 MG TB24    Sig: Take 2 tablets by mouth daily.    Dispense:  180 tablet    Refill:  1     Follow-up: Return in about 4 months (around 09/24/2018).  Scarlette Calico, MD

## 2018-05-25 NOTE — Patient Instructions (Signed)

## 2018-05-27 ENCOUNTER — Other Ambulatory Visit (HOSPITAL_COMMUNITY): Payer: Self-pay

## 2018-05-27 NOTE — Progress Notes (Signed)
Paramedicine Encounter    Patient ID: [name], male    DOB: 28-Nov-1966, 52 y.o.   MRN: [id number]   Patient Care Team: [name], MD as PCP - General (Internal Medicine) [name], MD as PCP - Cardiology (Cardiology) [name], MD as Consulting Physician (Cardiology)  Patient Active Problem List   Diagnosis Date Noted  . Acute viral conjunctivitis of both eyes 05/25/2018  . Thiamin deficiency 04/29/2018  . Jaundice 04/22/2018  . Cholelithiasis 04/22/2018  . CKD (chronic kidney disease), stage II 04/22/2018  . Hyperbilirubinemia   . Routine general medical examination at a health care facility 02/03/2018  . Idiopathic gout 02/02/2018  . PE (pulmonary thromboembolism) ([place]) 04/12/2017  . Hereditary and idiopathic peripheral neuropathy 07/16/2016  . Paroxysmal atrial fibrillation (HCC)   . Chronic anticoagulation, secondary to PE 03/2014 12/27/2014  . Osteoarthritis of both knees 12/27/2014  . NSVT (nonsustained ventricular tachycardia) ([place]) 09/26/2013  . Alcohol abuse 08/10/2013  . Hyperlipidemia with target LDL less than 100 06/29/2013  . Nonischemic cardiomyopathy ([place]) 01/26/2013  . Type II diabetes mellitus with manifestations ([place]) 04/13/2012  . Tobacco user 02/18/2012  . Migraine 08/10/2011  . ED (erectile dysfunction) 07/11/2011  . GAD (generalized anxiety disorder) 06/12/2010  . Morbid obesity ([place]) 06/11/2010  . Essential hypertension 06/11/2010  . Chronic combined systolic and diastolic CHF (congestive heart failure) ([place]) 06/11/2010  . Esophageal reflux 06/11/2010  . OSA (obstructive sleep apnea) with BiPap and oxygen 06/11/2010    Current Outpatient Medications:  .  aspirin EC 81 MG tablet, Take 1 tablet (81 mg total) by mouth daily., Disp: 90 tablet, Rfl: 3 .  blood glucose meter kit and supplies KIT, Use to check blood sugar. DX E11.9, Disp: 1 each, Rfl: 0 .  clonazePAM (KLONOPIN) 1 MG tablet, TAKE 1 TABLET BY MOUTH 3 TIMES DAILY AS  NEEDED FOR ANXIETY (Patient taking differently: Take 1 mg by mouth 2 (two) times daily. TAKE 1 TABLET BY MOUTH 2 TIMES DAILY CAN TAKE ANOTHER TABLET AS NEEDED FOR ANXIETY), Disp: 90 tablet, Rfl: 2 .  glucose blood test strip, Use as instructed to check sugar daily. DX E11.9, Disp: 100 each, Rfl: 3 .  Insulin Degludec (TRESIBA FLEXTOUCH) 200 UNIT/ML SOPN, Inject 30 Units into the skin daily., Disp: 9 mL, Rfl: 1 .  Insulin Pen Needle (NOVOFINE) 32G X 6 MM MISC, 1 Act by Does not apply route daily., Disp: 100 each, Rfl: 1 .  Lancets MISC, Use to check blood sugar daily. DX E11.9, Disp: 100 each, Rfl: 3 .  magnesium oxide (MAG-OX) 400 MG tablet, Take 400 mg by mouth daily as needed (cramping)., Disp: , Rfl:  .  metoprolol succinate (TOPROL-XL) 100 MG 24 hr tablet, Take 1 tablet (100 mg total) by mouth daily. Take with or immediately following a meal., Disp: 90 tablet, Rfl: 3 .  NON FORMULARY, Bipap, pressure 16/12 with 2L of O2, Disp: , Rfl:  .  pantoprazole (PROTONIX) 40 MG tablet, TAKE 1 TABLET (40 MG TOTAL) BY MOUTH DAILY., Disp: 90 tablet, Rfl: 3 .  potassium chloride SA (K-DUR,KLOR-CON) 20 MEQ tablet, Take 20 mEq by mouth daily as needed (cramping)., Disp: , Rfl:  .  SitaGLIPtin-MetFORMIN HCl (JANUMET XR) 50-1000 MG TB24, Take 2 tablets by mouth daily., Disp: 180 tablet, Rfl: 1 .  spironolactone (ALDACTONE) 25 MG tablet, Take 1 tablet (25 mg total) by mouth daily., Disp: 30 tablet, Rfl: 6 .  thiamine (VITAMIN B-1) 100 MG tablet, Take 1  tablet (100 mg total) by mouth daily., Disp: 90 tablet, Rfl: 1 .  torsemide (DEMADEX) 20 MG tablet, Take 2 tablets (40 mg total) by mouth daily. (Patient taking differently: Take 20 mg by mouth daily. ), Disp: 60 tablet, Rfl: 11 .  Tobramycin-Dexamethasone (TOBRADEX ST) 0.3-0.05 % SUSP, Apply 1 drop to eye 3 (three) times daily. (Patient not taking: Reported on 05/27/2018), Disp: 5 mL, Rfl: 0 Allergies  Allergen Reactions  . Ace Inhibitors Anaphylaxis and Swelling  .  Buspirone Other (See Comments)    dizziness      Social History   Socioeconomic History  . Marital status: Married    Spouse name: Not on file  . Number of children: 3  . Years of education: Not on file  . Highest education level: Not on file  Occupational History  . Occupation: DISABLED  Social Needs  . Financial resource strain: Not on file  . Food insecurity:    Worry: Not on file    Inability: Not on file  . Transportation needs:    Medical: Not on file    Non-medical: Not on file  Tobacco Use  . Smoking status: Current Every Day Smoker    Packs/day: 0.50    Years: 30.00    Pack years: 15.00    Types: Cigarettes  . Smokeless tobacco: Never Used  . Tobacco comment: Took information today to call for help with support to quit   Substance and Sexual Activity  . Alcohol use: Yes    Alcohol/week: 0.0 oz    Comment: Reports none in over a month, trying to quit   . Drug use: No  . Sexual activity: Not Currently  Lifestyle  . Physical activity:    Days per week: Not on file    Minutes per session: Not on file  . Stress: Not on file  Relationships  . Social connections:    Talks on phone: Not on file    Gets together: Not on file    Attends religious service: Not on file    Active member of club or organization: Not on file    Attends meetings of clubs or organizations: Not on file    Relationship status: Not on file  . Intimate partner violence:    Fear of current or ex partner: Not on file    Emotionally abused: Not on file    Physically abused: Not on file    Forced sexual activity: Not on file  Other Topics Concern  . Not on file  Social History Narrative   He smokes about a pack per day and he has been smoking since he was 52 years of age.  He drinks alcohol occasionally, but he denies any illicit drug abuse.  He is presently on disability.    Lives with wife in a 2 story home.  Has 2 children.   Previously worked in [employer], last worked in 1998.    Highest level of education:  11th grade            Physical Exam      Future Appointments  Date Time Provider [place]  06/07/2018 11:00 AM MC ECHO 1-BUZZ [facility]  06/07/2018 12:00 PM [name], MD [facility]SC None  06/23/2018 11:30 AM MC-[facility] PA/NP [facility] None    BP 118/78   Pulse 68   Resp 14   Wt (!) 409 lb (185.5 kg)   SpO2 98%   BMI 61.28 kg/m   Weight yesterday-412 Last visit  weight-405 CBG PTA-219   Pt reports he has been feeling eh---his weight is up 7lbs from last visit. He states he misses a lot of his meds due to the increased urination and he goes out to do a lot and tries to work and cant do what he needs to do while taking the diuretics. He recently was placed on new med janumet. zack has been working with him however I will start seeing him as he is located in [place]. He states he has had some trouble financially obtaining some of his meds and he is going to be switching to [facility] soon.  His magnesium is expired- He takes that as needed along with potassium as needed.  He is taking the janumet one tab daily-he wanted to see how if affects his CBG before he starts the 2. He self-doses with his torsemide. He suffering from depression and the fluid pill keeps him inside and adds to his depression. He is agreeing to start the losartan again and take the 64m BID.  He questions about whether or not he should start the zoloft back---he states his PCP the zoloft due to his liver issues and feels like he needs to be back on it.  I have asked him to try to take the meds at same time daily.  Pt denies any increased sob, most of the time he does ok climbing the stairs but sometimes gets sob when he gets to the top. Still smoking, I encouraged him to quit smoking. SMichela Pitcherhe would try.    KMarylouise Stacks EAmanaCMadonna Rehabilitation Specialty HospitalParamedic  05/27/18

## 2018-05-31 ENCOUNTER — Telehealth: Payer: Self-pay

## 2018-05-31 DIAGNOSIS — G4733 Obstructive sleep apnea (adult) (pediatric): Secondary | ICD-10-CM | POA: Diagnosis not present

## 2018-05-31 DIAGNOSIS — I509 Heart failure, unspecified: Secondary | ICD-10-CM | POA: Diagnosis not present

## 2018-05-31 DIAGNOSIS — I10 Essential (primary) hypertension: Secondary | ICD-10-CM | POA: Diagnosis not present

## 2018-05-31 NOTE — Telephone Encounter (Signed)
Fax from pharmacy asking for a cheaper alternative to [place] [employer]rops. PCP wanted to know if pt is feeling any better.  Called pt, no answer. lvm for pt to call back.   Please ask if he is feeling any better.

## 2018-06-07 ENCOUNTER — Ambulatory Visit (HOSPITAL_COMMUNITY): Admission: RE | Admit: 2018-06-07 | Payer: Medicare HMO | Source: Ambulatory Visit

## 2018-06-07 ENCOUNTER — Encounter (HOSPITAL_COMMUNITY): Payer: Self-pay | Admitting: Cardiology

## 2018-06-22 NOTE — Progress Notes (Addendum)
Advanced Heart Failure Clinic Note   Primary Care:Dr. Scarlette Calico Primary Cardiologist: Dr. Aundra Dubin   HPI: [name] is a 52 year old male with a past medical history of NICM, EF 25-30% in March 2018, felt to be related to prior ETOH abuse. He also has a history of PE 03/2014 completed a years course of Xarelto, morbid obesity, OSA, and tobacco abuse.   He was admitted 11/12/15-11/14/15 with acute on chronic systolic CHF and palpitations. He wore a 30 day event monitor at discharge as he had frequent PVC's and questionable Afib on telemetry. Also with some NSVT, so he was started on Amiodaone, but at follow up had not started taking it. He had previously refused ICD and was seen inpatient by Dr. Rayann Heman who felt that his morbid obesity was a prohibitive factor.   He was seen in the clinic in April 2018. He had started drinking ETOH again. Volume status was stable, he is not an Entresto candidate due to angioedema with lisinopril. Weight was 411 pounds.   Admitted 04/12/17 with SOB, chest pain. D- dimer was 1.16, chest CT without central obstructing PE, however more peripheral and subsegmental pulmonary artery branches were not confidently evaluated due to his body habitus. He was started on a heparin gtt for presumed PE, however VQ scan showed no PE. Troponin was elevated, peaked at 3.37. LHC showed no CAD. Echo showed an EF of 15%, grade 2 DD, no pericarditis. He was diuresed with IV lasix, and started on torsemide 38m at discharge. Discharge weight was 405 pounds.   Admitted 5/22 through 04/24/2018 with abdominal pain, nausea, and vomiting. 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 Did not require surgical intervention. He will have follow up with GI.   Today he returns for HF follow up. Last visit, he declined any changes. Overall doing fine. He decided to start taking losartan 50 mg daily after last visit. He is occasionally SOB when he has extra fluid. Activity is more limited by body stiffness. He has  chronic orthopnea. Wearing BiPAP qHS. Denies edema. He is fatigued at times. Occasional dizziness with rapid standing. Weights fluctuate at home 404-410 lbs. SBP 110s at home. He continues to smoke 1 ppd. Eats high salt foods and drinks a gallon of water most days. Followed by HF paramedicine.   Labs (5/13): K 4.1, creatinine 1.05 Labs (1/14): K 3.8, creatinine 1.16, BNP 54 Labs (2/14): K 3.7, creatinine 1.11, BNP 28 Labs (2/16): K 4.1, creatinine 1.03, LDL 96, HCT 40 Labs (3/16): K 3.7, K 1.13, BNP 367 Labs (8/16): BNP 43, K 3.5, creatinine 1.01 Labs (08/13/15): K 3.7, creatinine 1.18, HCT 41.3 Labs (12/16): K 3.7, creatinine 1.14, TGs 495, digoxin < 0.2 Labs (2/17): K 3.8, creatinine 0.99, LDL 107, TGs 222 Labs (5/17): K 4, creatinine 1.47 Labs (2/18): K 3.9, creatinine 1.06, hgb 15.1, TGs 163, LDL 89, HDL 28, TSH normal  Labs (5/18): K 3.8, creatinine 1.12.  Labs (12/30/2017): K 3.8 Creatinine 1.25  Labs (01/28/2018): K 3.8 Creatinine 1.08 Labs 03/19/2018: K 4.1 Creatinine 1/15 BNP 212  Labs 04/29/3018: Creatinine 1.1   PMH: 1. Nonischemic cardiomyopathy: Prior cath with no significant CAD.  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  Prior echoes with EF as low as 25%.  Echo (9/13) with EF 35-40%, moderate to severe LV dilation, diffuse hypokinesis, mild MR. Echo (5/15) with EF 30-35%, moderate to severe LAE, normal RV size and systolic function.  Angioedema with ACEI, headaches with hydralazine/nitrates. Echo (  3/16) with EF 25-30%, severe LV dilation, normal RV size and systolic function.  [facility] 08/13/15 showed no significant coronary disease; RA mean 6, PA 33/11 mean 23, PCWP mean 13, Fick CO/CI 4.75 /1.68 (difficult study, radial artery spasm, if needs future cath would use groin).  Echo (9/16) showed EF 20-25%.   - Echo (3/18): EF 25-30%, moderate LAE, Echo 4/18 EF 15%  2. HTN: angioedema with ACEI.  3. OSA: on Bipap 4. Morbid obesity 5. Paroxysmal  atrial fibrillation: Not documented recently.   6. Smoker.  7. Anxiety/panic attacks 8. PE: 5/15, diagnosed by V/Q scan. CTA chest 8/16 negative for PE.  9. NSVT, PVCs: 30 day monitor (12/16) with PVCs, PACs, no atrial fibrillation.  10. Hematuria: Apparently had negative workup by urology.  11. ABIs (6/16) were normal 12. 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  13. Gout 14. Low back pain.   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  Has son and daughter.    FH: No premature CAD.    Review of systems complete and found to be negative unless listed in HPI.   Past Medical History:  Diagnosis Date  . Alcohol abuse   . Anxiety state, unspecified   . Atrial fibrillation ([place])   . CHF (congestive heart failure) ([place])   . Chronic systolic heart failure ([place])   . Diabetes mellitus, type II ([place])   . Edema   . History of medication noncompliance   . Migraine   . Obesity, unspecified   . Obstructive sleep apnea   . Psychiatric disorder   . Pulmonary embolism ([place])   . Shortness of breath     Current Outpatient Medications  Medication Sig Dispense Refill  . aspirin EC 81 MG tablet Take 1 tablet (81 mg total) by mouth daily. 90 tablet 3  . blood glucose meter kit and supplies KIT Use to check blood sugar. DX E11.9 1 each 0  . clonazePAM (KLONOPIN) 1 MG tablet TAKE 1 TABLET BY MOUTH 3 TIMES DAILY AS NEEDED FOR ANXIETY (Patient taking differently: Take 1 mg by mouth 2 (two) times daily. TAKE 1 TABLET BY MOUTH 2 TIMES DAILY CAN TAKE ANOTHER TABLET AS NEEDED FOR ANXIETY) 90 tablet 2  . losartan (COZAAR) 50 MG tablet Take 50 mg by mouth daily.    . magnesium oxide (MAG-OX) 400 MG tablet Take 400 mg by mouth daily as needed (cramping).    . metoprolol succinate (TOPROL-XL) 100 MG 24 hr tablet Take 1 tablet (100 mg total) by mouth daily. Take with or immediately following a meal. 90 tablet 3  . NON FORMULARY Bipap, pressure 16/12 with 2L of  O2    . pantoprazole (PROTONIX) 40 MG tablet TAKE 1 TABLET (40 MG TOTAL) BY MOUTH DAILY. 90 tablet 3  . potassium chloride SA (K-DUR,KLOR-CON) 20 MEQ tablet Take 20 mEq by mouth daily as needed (cramping).    [place] Kitchen spironolactone (ALDACTONE) 25 MG tablet Take 1 tablet (25 mg total) by mouth daily. 30 tablet 6  . thiamine (VITAMIN B-1) 100 MG tablet Take 1 tablet (100 mg total) by mouth daily. 90 tablet 1  . Tobramycin-Dexamethasone (TOBRADEX ST) 0.3-0.05 % SUSP Apply 1 drop to eye 3 (three) times daily. 5 mL 0  . torsemide (DEMADEX) 20 MG tablet Take 30 mg by mouth daily.    . SitaGLIPtin-MetFORMIN HCl (JANUMET XR) 50-1000 MG TB24 Take 2 tablets by mouth daily. (Patient not taking: Reported on 06/23/2018)  180 tablet 1   No current facility-administered medications for this encounter.     Allergies  Allergen Reactions  . Ace Inhibitors Anaphylaxis and Swelling  . Buspirone Other (See Comments)    dizziness      Social History   Socioeconomic History  . Marital status: Married    Spouse name: Not on file  . Number of children: 3  . Years of education: Not on file  . Highest education level: Not on file  Occupational History  . Occupation: DISABLED  Social Needs  . Financial resource strain: Not on file  . Food insecurity:    Worry: Not on file    Inability: Not on file  . Transportation needs:    Medical: Not on file    Non-medical: Not on file  Tobacco Use  . Smoking status: Current Every Day Smoker    Packs/day: 0.50    Years: 30.00    Pack years: 15.00    Types: Cigarettes  . Smokeless tobacco: Never Used  . Tobacco comment: Took information today to call for help with support to quit   Substance and Sexual Activity  . Alcohol use: Yes    Alcohol/week: 0.0 oz    Comment: Reports none in over a month, trying to quit   . Drug use: No  . Sexual activity: Not Currently  Lifestyle  . Physical activity:    Days per week: Not on file    Minutes per session: Not on file  .  Stress: Not on file  Relationships  . Social connections:    Talks on phone: Not on file    Gets together: Not on file    Attends religious service: Not on file    Active member of club or organization: Not on file    Attends meetings of clubs or organizations: Not on file    Relationship status: Not on file  . Intimate partner violence:    Fear of current or ex partner: Not on file    Emotionally abused: Not on file    Physically abused: Not on file    Forced sexual activity: Not on file  Other Topics Concern  . Not on file  Social History Narrative   He smokes about a pack per day and he has been smoking since he was 52 years of age.  He drinks alcohol occasionally, but he denies any illicit drug abuse.  He is presently on disability.    Lives with wife in a 2 story home.  Has 2 children.   Previously worked in [employer], last worked in 1998.   Highest level of education:  11th grade              Family History  Problem Relation Age of Onset  . Cancer Mother        brain tumor  . Hypertension Mother   . Diabetes Father        Deceased, 67  . Heart disease Maternal Grandmother   . Hypertension Unknown        Family History  . Stroke Unknown        Family History  . Diabetes Unknown        Family History  . Diabetes Daughter     Vitals:   06/23/18 1132  BP: 128/82  Pulse: 71  SpO2: 96%  Weight: (!) 410 lb 9.6 oz (186.2 kg)   Filed Weights   06/23/18 1132  Weight: (!) 410 lb 9.6 oz (186.2  kg)   Wt Readings from Last 3 Encounters:  06/23/18 (!) 410 lb 9.6 oz (186.2 kg)  05/27/18 (!) 409 lb (185.5 kg)  05/25/18 (!) 411 lb 4 oz (186.5 kg)    PHYSICAL EXAM: General: Obese. No resp difficulty. HEENT: Normal Neck: Supple. JVP ~8. Carotids 2+ bilat; no bruits. No thyromegaly or nodule noted. Cor: PMI nondisplaced. RRR, No M/G/R noted Lungs: CTAB, normal effort. Abdomen: Soft, non-tender, non-distended, no HSM. No bruits or masses. +BS  Extremities: No  cyanosis, clubbing, or rash. R and LLE no edema.  Neuro: Alert & orientedx3, cranial nerves grossly intact. moves all 4 extremities w/o difficulty. Affect pleasant  ASSESSMENT & PLAN: 1. Chronic systolic CHF: Nonischemic cardiomyopathy. Echo (3/18) with EF 25-30%. He was seen by EP and decided against ICD given marked obesity.  QRS not wide enough for CRT. Echo 4/18 EF 15%. Missed echo appointment 03/2018. Will reschedule.  NYHA II-III. Volume status mildly elevated. Continue torsemide 30 mg daily. (ordered for 40 mg daily, but declines increasing). He has not taken torsemide yet today and does not want to take extra. - Continue Spiro 25 mg daily - Continue Toprol XL 19m  - Continue losartan 50 mg daily. He restarted this after last visit.  - No entresto with h/o angioedema.  - Had side effects from digoxin, does not want to take. - Headaches with Bidil, cannot take.  - Refer back to cardiac rehab maintenance program - Discussed limiting fluid and salt intake.  - Paramedicine is signing off because pt refuses to be compliant with medications or diet, despite their efforts.   2. Obesity:  Body mass index is 61.52 kg/m.  - He has information to pursue weight loss surgery. - Last visit, met with our exercise physiologist for home exercise program.   3. Smoking:  - Smoking 1 ppd. Encouraged cessation  4. OSA:  - Continue nightly BiPAP. No change. He is compliant with this.   5. Paroxysmal atrial fibrillation:  - Regular on exam - Refuses anticoagulation. Has not been anticoagulated in 3 years.   6. PE: 04/14/2018 , diagnosed by V/Q scan.  He was on Xarelto for about a year but stopped in the setting of hematuria.  - Recent hospitalization without PE on CT and VQ scan. Not on anticoagulation. Does not want to anticoagulants. No change.  7. Anxiety - Has follow up with psychiatry.    8. GERD  Refuses any medication changes.  CMET today Reschedule echo Follow up 8 weeks Refer to  cardiac rehab maintenance program  AGeorgiana Shore NP 06/23/18   Greater than 50% of the 25 minute visit was spent in counseling/coordination of care regarding disease state education, salt/fluid restriction, sliding scale diuretics, and medication compliance.

## 2018-06-23 ENCOUNTER — Telehealth (HOSPITAL_COMMUNITY): Payer: Self-pay | Admitting: Surgery

## 2018-06-23 ENCOUNTER — Other Ambulatory Visit (HOSPITAL_COMMUNITY): Payer: Self-pay

## 2018-06-23 ENCOUNTER — Ambulatory Visit (HOSPITAL_COMMUNITY)
Admission: RE | Admit: 2018-06-23 | Discharge: 2018-06-23 | Disposition: A | Discharge status: Home / Self Care | Payer: Medicare HMO | Source: Ambulatory Visit | Attending: Internal Medicine | Admitting: Internal Medicine

## 2018-06-23 ENCOUNTER — Encounter (HOSPITAL_COMMUNITY): Payer: Self-pay

## 2018-06-23 VITALS — BP 128/82 | HR 71 | Wt >= 6400 oz

## 2018-06-23 DIAGNOSIS — T783XXA Angioneurotic edema, initial encounter: Secondary | ICD-10-CM | POA: Insufficient documentation

## 2018-06-23 DIAGNOSIS — G4733 Obstructive sleep apnea (adult) (pediatric): Secondary | ICD-10-CM

## 2018-06-23 DIAGNOSIS — F17200 Nicotine dependence, unspecified, uncomplicated: Secondary | ICD-10-CM

## 2018-06-23 DIAGNOSIS — I5042 Chronic combined systolic (congestive) and diastolic (congestive) heart failure: Secondary | ICD-10-CM

## 2018-06-23 DIAGNOSIS — K219 Gastro-esophageal reflux disease without esophagitis: Secondary | ICD-10-CM | POA: Diagnosis not present

## 2018-06-23 DIAGNOSIS — I472 Ventricular tachycardia: Secondary | ICD-10-CM | POA: Diagnosis not present

## 2018-06-23 DIAGNOSIS — Z86711 Personal history of pulmonary embolism: Secondary | ICD-10-CM | POA: Diagnosis not present

## 2018-06-23 DIAGNOSIS — F419 Anxiety disorder, unspecified: Secondary | ICD-10-CM | POA: Insufficient documentation

## 2018-06-23 DIAGNOSIS — Z7982 Long term (current) use of aspirin: Secondary | ICD-10-CM | POA: Insufficient documentation

## 2018-06-23 DIAGNOSIS — Z6841 Body Mass Index (BMI) 40.0 and over, adult: Secondary | ICD-10-CM | POA: Diagnosis not present

## 2018-06-23 DIAGNOSIS — I110 Hypertensive heart disease with heart failure: Secondary | ICD-10-CM | POA: Insufficient documentation

## 2018-06-23 DIAGNOSIS — X58XXXA Exposure to other specified factors, initial encounter: Secondary | ICD-10-CM | POA: Diagnosis not present

## 2018-06-23 DIAGNOSIS — M109 Gout, unspecified: Secondary | ICD-10-CM | POA: Insufficient documentation

## 2018-06-23 DIAGNOSIS — I493 Ventricular premature depolarization: Secondary | ICD-10-CM | POA: Insufficient documentation

## 2018-06-23 DIAGNOSIS — I5022 Chronic systolic (congestive) heart failure: Secondary | ICD-10-CM | POA: Diagnosis not present

## 2018-06-23 DIAGNOSIS — Z79899 Other long term (current) drug therapy: Secondary | ICD-10-CM | POA: Insufficient documentation

## 2018-06-23 DIAGNOSIS — I480 Paroxysmal atrial fibrillation: Secondary | ICD-10-CM | POA: Diagnosis not present

## 2018-06-23 DIAGNOSIS — E119 Type 2 diabetes mellitus without complications: Secondary | ICD-10-CM | POA: Diagnosis not present

## 2018-06-23 DIAGNOSIS — R69 Illness, unspecified: Secondary | ICD-10-CM | POA: Diagnosis not present

## 2018-06-23 DIAGNOSIS — F17210 Nicotine dependence, cigarettes, uncomplicated: Secondary | ICD-10-CM | POA: Diagnosis not present

## 2018-06-23 DIAGNOSIS — I429 Cardiomyopathy, unspecified: Secondary | ICD-10-CM | POA: Insufficient documentation

## 2018-06-23 LAB — COMPREHENSIVE METABOLIC PANEL
ALBUMIN: 3.3 g/dL — AB (ref 3.5–5.0)
ALT: 22 U/L (ref 0–44)
AST: 16 U/L (ref 15–41)
Alkaline Phosphatase: 107 U/L (ref 38–126)
Anion gap: 9 (ref 5–15)
BUN: 17 mg/dL (ref 6–20)
CO2: 25 mmol/L (ref 22–32)
Calcium: 8.8 mg/dL — ABNORMAL LOW (ref 8.9–10.3)
Chloride: 106 mmol/L (ref 98–111)
Creatinine, Ser: 1.15 mg/dL (ref 0.61–1.24)
GFR calc Af Amer: >60 mL/min (ref >60)
GFR calc non Af Amer: >60 mL/min (ref >60)
Glucose, Bld: 151 mg/dL — ABNORMAL HIGH (ref 70–99)
POTASSIUM: 3.6 mmol/L (ref 3.5–5.1)
Sodium: 140 mmol/L (ref 135–145)
Total Bilirubin: 1.2 mg/dL (ref 0.3–1.2)
Total Protein: 7 g/dL (ref 6.5–8.1)

## 2018-06-23 NOTE — Progress Notes (Signed)
Paramedicine Encounter   Patient ID: [name] , male,   DOB: 11/03/1966,51 y.o.,  MRN: [id number]   Met patient in clinic today with provider.  Time spent with patient 45min  Weight @ home-404 Weight @ clinic-410  Pt reports he has been taking his losartan 50mg again since our last visit. Once a day. He reports his CBG's have been in the 130-140 range without taking his diabetic meds. Susie advised him to let his PCP aware of his medicine changes. He reports he has been working out with light weights. Potassium as needed. He self doses a lot.  He missed his last echo and clinic visit--he is going to have to resch his echo.  He reports eating a pot of beans that is seasoned with turkey necks.           Pt is still smoking. He states he plans to quit every day but its hard as he started when he was 17. Unable to lay flat, using bipap sometimes. He states his 02 has been dropping, he has sp02 at home. Blood work today, he will be referred back to cardiac rehab and resch for his echo. Discussed d/c'ing paramedicine with pt as he is independent, does his own meds, he does self dose and doesn't really take heed of much advice. Does not feel like paramedicine is making any changes. Advised him if he decided he needed more support in the future then he could always call back.    , EMT-Paramedic [phone] 06/23/2018   ACTION: Home visit completed      

## 2018-06-23 NOTE — Patient Instructions (Signed)
Routine lab work today. Will notify you of abnormal results, otherwise no news is good news!  Will refer you to Cardiac Rehab at [facility]. They will call you to set up initial appointment.  Echocardiogram at [facility] (same check in you did today).  _________________________________________________________________ [name] Code:   Follow up 8 weeks.  __________________________________________________________________ [name] Code: 1518  Take all medication as prescribed the day of your appointment. Bring all medications with you to your appointment.  Do the following things EVERYDAY: 1) Weigh yourself in the morning before breakfast. Write it down and keep it in a log. 2) Take your medicines as prescribed 3) Eat low salt foods-Limit salt (sodium) to 2000 mg per day.  4) Stay as active as you can everyday 5) Limit all fluids for the day to less than 2 liters

## 2018-06-23 NOTE — Telephone Encounter (Signed)
Patient to be discharged from the HF Community Paramedicine program.  He is successfully independently manages his HF at home.  He will continue to follow in the AHF clinic and is aware of when to contact the HF Clinic with any worsening symptoms.

## 2018-06-23 NOTE — Addendum Note (Signed)
Encounter addended by: Georgiana Shore, NP on: 06/23/2018 12:26 PM  Actions taken: Sign clinical note

## 2018-06-25 ENCOUNTER — Telehealth (HOSPITAL_COMMUNITY): Payer: Self-pay

## 2018-06-25 NOTE — Telephone Encounter (Signed)
Patients insurance is active and benefits verified through [place] - $45.00 co-pay, no deductible, out of pocket amount of $4,200/$943.09 has been met, no co-insurance, and no pre-authorization is required. Passport/reference [phone]

## 2018-06-30 ENCOUNTER — Ambulatory Visit (HOSPITAL_COMMUNITY)
Admission: RE | Admit: 2018-06-30 | Discharge: 2018-06-30 | Disposition: A | Discharge status: Home / Self Care | Payer: Medicare HMO | Source: Ambulatory Visit | Attending: Internal Medicine | Admitting: Internal Medicine

## 2018-06-30 DIAGNOSIS — I5042 Chronic combined systolic (congestive) and diastolic (congestive) heart failure: Secondary | ICD-10-CM | POA: Diagnosis not present

## 2018-06-30 NOTE — Progress Notes (Signed)
  Echocardiogram 2D Echocardiogram has been performed.  Madelaine Etienne 06/30/2018, 11:55 AM

## 2018-07-01 DIAGNOSIS — I509 Heart failure, unspecified: Secondary | ICD-10-CM | POA: Diagnosis not present

## 2018-07-01 DIAGNOSIS — I10 Essential (primary) hypertension: Secondary | ICD-10-CM | POA: Diagnosis not present

## 2018-07-01 DIAGNOSIS — G4733 Obstructive sleep apnea (adult) (pediatric): Secondary | ICD-10-CM | POA: Diagnosis not present

## 2018-07-02 ENCOUNTER — Telehealth (HOSPITAL_COMMUNITY): Payer: Self-pay | Admitting: *Deleted

## 2018-07-26 ENCOUNTER — Other Ambulatory Visit: Payer: Self-pay | Admitting: Internal Medicine

## 2018-07-26 DIAGNOSIS — M1000 Idiopathic gout, unspecified site: Secondary | ICD-10-CM

## 2018-08-01 DIAGNOSIS — I10 Essential (primary) hypertension: Secondary | ICD-10-CM | POA: Diagnosis not present

## 2018-08-01 DIAGNOSIS — G4733 Obstructive sleep apnea (adult) (pediatric): Secondary | ICD-10-CM | POA: Diagnosis not present

## 2018-08-01 DIAGNOSIS — I509 Heart failure, unspecified: Secondary | ICD-10-CM | POA: Diagnosis not present

## 2018-08-23 NOTE — Progress Notes (Signed)
Advanced Heart Failure Clinic Note   Primary Care:Dr. Scarlette Calico Primary Cardiologist: Dr. Aundra Dubin   HPI: [name] is a 52 year old male with a past medical history of NICM, EF 25-30% in March 2018, felt to be related to prior ETOH abuse. He also has a history of PE 03/2014 completed a years course of Xarelto, morbid obesity, OSA, and tobacco abuse.   He was admitted 11/12/15-11/14/15 with acute on chronic systolic CHF and palpitations. He wore a 30 day event monitor at discharge as he had frequent PVC's and questionable Afib on telemetry. Also with some NSVT, so he was started on Amiodaone, but at follow up had not started taking it. He had previously refused ICD and was seen inpatient by Dr. Rayann Heman who felt that his morbid obesity was a prohibitive factor.   He was seen in the clinic in April 2018. He had started drinking ETOH again. Volume status was stable, he is not an Entresto candidate due to angioedema with lisinopril. Weight was 411 pounds.   Admitted 04/12/17 with SOB, chest pain. D- dimer was 1.16, chest CT without central obstructing PE, however more peripheral and subsegmental pulmonary artery branches were not confidently evaluated due to his body habitus. He was started on a heparin gtt for presumed PE, however VQ scan showed no PE. Troponin was elevated, peaked at 3.37. LHC showed no CAD. Echo showed an EF of 15%, grade 2 DD, no pericarditis. He was diuresed with IV lasix, and started on torsemide 88m at discharge. Discharge weight was 405 pounds.   Admitted 5/22 through 04/24/2018 with abdominal pain, nausea, and vomiting. 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 Did not require surgical intervention. He will have follow up with GI.   Today he returns for HF follow up. He refused medication changes last visit. Has been discharged from paramedicine due to noncompliance. Was referred to cardiac rehab last visit. Overall doing poorly. He is having more frequent panic attacks, so he  restarted his zoloft. He has been having dizziness and weight loss since restarting. He also is unable to have an orgasm. He is not having CP or SOB with activity, but is having SOB, CP, and palpitations at rest and associates this with panic attacks. Wears BiPAP qHS. Appetite is poor. He is more fatigued and is very concerned about his weight loss. Weights have gone down from 410 to 388 on his home scale, sometimes dropping 5 lbs in one night. SBP 90s at home. Smoking 1 ppd. Discharged from HF paramedicine due to noncompliance .  Labs (5/13): K 4.1, creatinine 1.05 Labs (1/14): K 3.8, creatinine 1.16, BNP 54 Labs (2/14): K 3.7, creatinine 1.11, BNP 28 Labs (2/16): K 4.1, creatinine 1.03, LDL 96, HCT 40 Labs (3/16): K 3.7, K 1.13, BNP 367 Labs (8/16): BNP 43, K 3.5, creatinine 1.01 Labs (08/13/15): K 3.7, creatinine 1.18, HCT 41.3 Labs (12/16): K 3.7, creatinine 1.14, TGs 495, digoxin < 0.2 Labs (2/17): K 3.8, creatinine 0.99, LDL 107, TGs 222 Labs (5/17): K 4, creatinine 1.47 Labs (2/18): K 3.9, creatinine 1.06, hgb 15.1, TGs 163, LDL 89, HDL 28, TSH normal  Labs (5/18): K 3.8, creatinine 1.12.  Labs (12/30/2017): K 3.8 Creatinine 1.25  Labs (01/28/2018): K 3.8 Creatinine 1.08 Labs 03/19/2018: K 4.1 Creatinine 1/15 BNP 212  Labs 04/29/3018: Creatinine 1.1   PMH: 1. Nonischemic cardiomyopathy: Prior cath with no significant CAD.  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  Prior echoes with EF as  low as 25%.  Echo (9/13) with EF 35-40%, moderate to severe LV dilation, diffuse hypokinesis, mild MR. Echo (5/15) with EF 30-35%, moderate to severe LAE, normal RV size and systolic function.  Angioedema with ACEI, headaches with hydralazine/nitrates. Echo (3/16) with EF 25-30%, severe LV dilation, normal RV size and systolic function.  [facility] 08/13/15 showed no significant coronary disease; RA mean 6, PA 33/11 mean 23, PCWP mean 13, Fick CO/CI 4.75 /1.68 (difficult  study, radial artery spasm, if needs future cath would use groin).  Echo (9/16) showed EF 20-25%.   - Echo (3/18): EF 25-30%, moderate LAE, Echo 4/18 EF 15%, Echo 7/19 EF 20-25% 2. HTN: angioedema with ACEI.  3. OSA: on Bipap 4. Morbid obesity 5. Paroxysmal atrial fibrillation: Not documented recently.   6. Smoker.  7. Anxiety/panic attacks 8. PE: 5/15, diagnosed by V/Q scan. CTA chest 8/16 negative for PE.  9. NSVT, PVCs: 30 day monitor (12/16) with PVCs, PACs, no atrial fibrillation.  10. Hematuria: Apparently had negative workup by urology.  11. ABIs (6/16) were normal 12. 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  13. Gout 14. Low back pain.   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  Has son and daughter.    FH: No premature CAD.    Review of systems complete and found to be negative unless listed in HPI.   Past Medical History:  Diagnosis Date  . Alcohol abuse   . Anxiety state, unspecified   . Atrial fibrillation ([place])   . CHF (congestive heart failure) ([place])   . Chronic systolic heart failure ([place])   . Diabetes mellitus, type II ([place])   . Edema   . History of medication noncompliance   . Migraine   . Obesity, unspecified   . Obstructive sleep apnea   . Psychiatric disorder   . Pulmonary embolism ([place])   . Shortness of breath     Current Outpatient Medications  Medication Sig Dispense Refill  . allopurinol (ZYLOPRIM) 100 MG tablet TAKE 1 TABLET BY MOUTH EVERY DAY 90 tablet 1  . aspirin EC 81 MG tablet Take 1 tablet (81 mg total) by mouth daily. 90 tablet 3  . blood glucose meter kit and supplies KIT Use to check blood sugar. DX E11.9 1 each 0  . clonazePAM (KLONOPIN) 1 MG tablet TAKE 1 TABLET BY MOUTH 3 TIMES DAILY AS NEEDED FOR ANXIETY (Patient taking differently: Take 1 mg by mouth 2 (two) times daily. TAKE 1 TABLET BY MOUTH 2 TIMES DAILY CAN TAKE ANOTHER TABLET AS NEEDED FOR ANXIETY) 90 tablet 2  . losartan  (COZAAR) 50 MG tablet Take 50 mg by mouth daily.    . metoprolol succinate (TOPROL-XL) 100 MG 24 hr tablet Take 1 tablet (100 mg total) by mouth daily. Take with or immediately following a meal. 90 tablet 3  . NON FORMULARY Bipap, pressure 16/12 with 2L of O2    . pantoprazole (PROTONIX) 40 MG tablet TAKE 1 TABLET (40 MG TOTAL) BY MOUTH DAILY. 90 tablet 3  . potassium chloride SA (K-DUR,KLOR-CON) 20 MEQ tablet Take 20 mEq by mouth daily as needed (cramping).    . sertraline (ZOLOFT) 100 MG tablet Take 100 mg by mouth daily.    . SitaGLIPtin-MetFORMIN HCl (JANUMET XR) 50-1000 MG TB24 Take 2 tablets by mouth daily. 180 tablet 1  . spironolactone (ALDACTONE) 25 MG tablet Take 1 tablet (25 mg total) by mouth daily. 30 tablet 6  .  thiamine (VITAMIN B-1) 100 MG tablet Take 1 tablet (100 mg total) by mouth daily. 90 tablet 1  . Tobramycin-Dexamethasone (TOBRADEX ST) 0.3-0.05 % SUSP Apply 1 drop to eye 3 (three) times daily. 5 mL 0  . torsemide (DEMADEX) 20 MG tablet Take 40 mg by mouth daily.    . magnesium oxide (MAG-OX) 400 MG tablet Take 400 mg by mouth daily as needed (cramping).     No current facility-administered medications for this encounter.     Allergies  Allergen Reactions  . Ace Inhibitors Anaphylaxis and Swelling  . Buspirone Other (See Comments)    dizziness      Social History   Socioeconomic History  . Marital status: Married    Spouse name: Not on file  . Number of children: 3  . Years of education: Not on file  . Highest education level: Not on file  Occupational History  . Occupation: DISABLED  Social Needs  . Financial resource strain: Not on file  . Food insecurity:    Worry: Not on file    Inability: Not on file  . Transportation needs:    Medical: Not on file    Non-medical: Not on file  Tobacco Use  . Smoking status: Current Every Day Smoker    Packs/day: 0.50    Years: 30.00    Pack years: 15.00    Types: Cigarettes  . Smokeless tobacco: Never Used    . Tobacco comment: Took information today to call for help with support to quit   Substance and Sexual Activity  . Alcohol use: Yes    Alcohol/week: 0.0 standard drinks    Comment: Reports none in over a month, trying to quit   . Drug use: No  . Sexual activity: Not Currently  Lifestyle  . Physical activity:    Days per week: Not on file    Minutes per session: Not on file  . Stress: Not on file  Relationships  . Social connections:    Talks on phone: Not on file    Gets together: Not on file    Attends religious service: Not on file    Active member of club or organization: Not on file    Attends meetings of clubs or organizations: Not on file    Relationship status: Not on file  . Intimate partner violence:    Fear of current or ex partner: Not on file    Emotionally abused: Not on file    Physically abused: Not on file    Forced sexual activity: Not on file  Other Topics Concern  . Not on file  Social History Narrative   He smokes about a pack per day and he has been smoking since he was 52 years of age.  He drinks alcohol occasionally, but he denies any illicit drug abuse.  He is presently on disability.    Lives with wife in a 2 story home.  Has 2 children.   Previously worked in [employer], last worked in 1998.   Highest level of education:  11th grade              Family History  Problem Relation Age of Onset  . Cancer Mother        brain tumor  . Hypertension Mother   . Diabetes Father        Deceased, 99  . Heart disease Maternal Grandmother   . Hypertension Unknown        Family History  .  Stroke Unknown        Family History  . Diabetes Unknown        Family History  . Diabetes Daughter     Vitals:   08/24/18 1101  BP: 116/72  Pulse: 71  SpO2: 98%  Weight: (!) 178.7 kg (394 lb)   Filed Weights   08/24/18 1101  Weight: (!) 178.7 kg (394 lb)   Wt Readings from Last 3 Encounters:  08/24/18 (!) 178.7 kg (394 lb)  06/23/18 (!) 186.2 kg (410 lb  9.6 oz)  05/27/18 (!) 185.5 kg (409 lb)    PHYSICAL EXAM: General: Obese. No resp difficulty. HEENT: Normal Neck: Supple. JVP flat. Carotids 2+ bilat; no bruits. No thyromegaly or nodule noted. Cor: PMI nondisplaced. RRR, No M/G/R noted Lungs: CTAB, normal effort. +dry cough Abdomen: obese, soft, non-tender, non-distended, no HSM. No bruits or masses. +BS  Extremities: No cyanosis, clubbing, or rash. R and LLE no edema.  Neuro: Alert & orientedx3, cranial nerves grossly intact. moves all 4 extremities w/o difficulty. Affect pleasant   ASSESSMENT & PLAN: 1. Chronic systolic CHF: Nonischemic cardiomyopathy. Echo (3/18) with EF 25-30%. He was seen by EP and decided against ICD given marked obesity.  QRS not wide enough for CRT. Echo 4/18 EF 15%.  - Echo 05/2018: EF 20-25%. Not an ICD candidate with marked obesity. - NYHA II-III.  - Volume status stable. Continue torsemide 40 mg daily. (most days takes 30 mg) - Continue Spiro 25 mg daily - Continue Toprol XL 151m  - Continue losartan 50 mg daily. He restarted this after last visit.  - No entresto with h/o angioedema.  - Had side effects from digoxin, does not want to take. - Headaches with Bidil, cannot take.  - Refer back to cardiac rehab maintenance program. Cannot afford $61/month copay. Plans to start Silver Sneakers - Discussed limiting fluid and salt intake.  - Paramedicine has signed off because pt refuses to be compliant with medications or diet, despite their efforts.   2. Obesity:  Body mass index is 59.04 kg/m.  - He has information to pursue weight loss surgery.  3. Smoking:  - Smoking 1 ppd. Encouraged cessation. No change.  4. OSA:  - Continue nightly BiPAP. Compliant.   5. Paroxysmal atrial fibrillation:  - Regular on exam.  No change. Ziopatch as below. - Refuses anticoagulation. Has not been anticoagulated in 3 years.   6. PE: 04/14/2018 , diagnosed by V/Q scan.  He was on Xarelto for about a year but  stopped in the setting of hematuria.  - Recent hospitalization without PE on CT and VQ scan. Not on anticoagulation. Does not want to anticoagulants. No change.   7. Anxiety/Panic attacks - Now back on zoloft, but having a lot of side effects. - Encouraged him to follow up with pysch. Placed Dr EJoycelyn Schmidnumber on AVS so he can call for follow up.   8. GERD  9. Palpitations - Sounds like this is related to anxiety/panic attacks. - Check 7 day ziopatch. BMET today.  CMET 7 day Ziopatch Follow up in 2 months with Dr MMatilde Bash NP 08/24/18   Greater than 50% of the 25 minute visit was spent in counseling/coordination of care regarding disease state education, salt/fluid restriction, sliding scale diuretics, and medication compliance.  Patient seen with NP, agree with the above note.   Weight is down 22 lbs since last appointment, he does not look volume overloaded on exam.  He has not  tolerated higher medication doses well, says he gets lightheaded.  - Continue torsemide 20 mg daily.  - Continue spironolactone 25 mg daily, Toprol XL 100 mg/day, and losartan 50 mg daily.  - BMET today.   He is not drinking any ETOH but still smokes. I encouraged him eo quit.   He has episodes of heart "racing" when he is under stress/anxious.  I will have him wear a Zio patch for 7 days to investigate.  He does not have an ICD, this was deferred due to his size.   [name] 08/24/2018

## 2018-08-24 ENCOUNTER — Ambulatory Visit (HOSPITAL_COMMUNITY)
Admission: RE | Admit: 2018-08-24 | Discharge: 2018-08-24 | Disposition: A | Discharge status: Home / Self Care | Payer: Medicare HMO | Source: Ambulatory Visit | Attending: Cardiology | Admitting: Cardiology

## 2018-08-24 ENCOUNTER — Encounter (HOSPITAL_COMMUNITY): Payer: Self-pay

## 2018-08-24 ENCOUNTER — Telehealth (HOSPITAL_COMMUNITY): Payer: Self-pay | Admitting: Surgery

## 2018-08-24 VITALS — BP 116/72 | HR 71 | Wt 394.0 lb

## 2018-08-24 DIAGNOSIS — F419 Anxiety disorder, unspecified: Secondary | ICD-10-CM | POA: Diagnosis not present

## 2018-08-24 DIAGNOSIS — Z8249 Family history of ischemic heart disease and other diseases of the circulatory system: Secondary | ICD-10-CM | POA: Insufficient documentation

## 2018-08-24 DIAGNOSIS — I480 Paroxysmal atrial fibrillation: Secondary | ICD-10-CM | POA: Insufficient documentation

## 2018-08-24 DIAGNOSIS — I110 Hypertensive heart disease with heart failure: Secondary | ICD-10-CM | POA: Insufficient documentation

## 2018-08-24 DIAGNOSIS — F17200 Nicotine dependence, unspecified, uncomplicated: Secondary | ICD-10-CM

## 2018-08-24 DIAGNOSIS — E119 Type 2 diabetes mellitus without complications: Secondary | ICD-10-CM | POA: Diagnosis not present

## 2018-08-24 DIAGNOSIS — Z86711 Personal history of pulmonary embolism: Secondary | ICD-10-CM | POA: Insufficient documentation

## 2018-08-24 DIAGNOSIS — R69 Illness, unspecified: Secondary | ICD-10-CM | POA: Diagnosis not present

## 2018-08-24 DIAGNOSIS — K219 Gastro-esophageal reflux disease without esophagitis: Secondary | ICD-10-CM | POA: Insufficient documentation

## 2018-08-24 DIAGNOSIS — I429 Cardiomyopathy, unspecified: Secondary | ICD-10-CM | POA: Diagnosis not present

## 2018-08-24 DIAGNOSIS — Z79899 Other long term (current) drug therapy: Secondary | ICD-10-CM | POA: Insufficient documentation

## 2018-08-24 DIAGNOSIS — I5042 Chronic combined systolic (congestive) and diastolic (congestive) heart failure: Secondary | ICD-10-CM

## 2018-08-24 DIAGNOSIS — G4733 Obstructive sleep apnea (adult) (pediatric): Secondary | ICD-10-CM | POA: Diagnosis not present

## 2018-08-24 DIAGNOSIS — I5022 Chronic systolic (congestive) heart failure: Secondary | ICD-10-CM | POA: Insufficient documentation

## 2018-08-24 DIAGNOSIS — Z7982 Long term (current) use of aspirin: Secondary | ICD-10-CM | POA: Insufficient documentation

## 2018-08-24 DIAGNOSIS — I428 Other cardiomyopathies: Secondary | ICD-10-CM

## 2018-08-24 DIAGNOSIS — F17210 Nicotine dependence, cigarettes, uncomplicated: Secondary | ICD-10-CM | POA: Insufficient documentation

## 2018-08-24 DIAGNOSIS — R002 Palpitations: Secondary | ICD-10-CM

## 2018-08-24 DIAGNOSIS — Z6841 Body Mass Index (BMI) 40.0 and over, adult: Secondary | ICD-10-CM | POA: Diagnosis not present

## 2018-08-24 DIAGNOSIS — E669 Obesity, unspecified: Secondary | ICD-10-CM | POA: Insufficient documentation

## 2018-08-24 LAB — COMPREHENSIVE METABOLIC PANEL
ALBUMIN: 3.4 g/dL — AB (ref 3.5–5.0)
ALT: 125 U/L — AB (ref 0–44)
AST: 102 U/L — AB (ref 15–41)
Alkaline Phosphatase: 232 U/L — ABNORMAL HIGH (ref 38–126)
Anion gap: 10 (ref 5–15)
BUN: 11 mg/dL (ref 6–20)
CHLORIDE: 102 mmol/L (ref 98–111)
CO2: 27 mmol/L (ref 22–32)
CREATININE: 1.16 mg/dL (ref 0.61–1.24)
Calcium: 8.8 mg/dL — ABNORMAL LOW (ref 8.9–10.3)
GFR calc Af Amer: >60 mL/min (ref >60)
GFR calc non Af Amer: >60 mL/min (ref >60)
GLUCOSE: 122 mg/dL — AB (ref 70–99)
Potassium: 3.3 mmol/L — ABNORMAL LOW (ref 3.5–5.1)
SODIUM: 139 mmol/L (ref 135–145)
Total Bilirubin: 1.6 mg/dL — ABNORMAL HIGH (ref 0.3–1.2)
Total Protein: 7.8 g/dL (ref 6.5–8.1)

## 2018-08-24 NOTE — Telephone Encounter (Signed)
I spoke with [name] regarding his upcoming appt with Dr. Aundra Dubin in the [facility] on October 1.  He actually has an appt today in the AHF APP clinic and is happy to cancel next weeks appt with hopes to see Dr. Aundra Dubin briefly today as well.  Plan communicated to Lillia Mountain NP and she is in agreement.

## 2018-08-24 NOTE — Progress Notes (Addendum)
Advanced Heart Failure Clinic Note   Primary Care:Dr. Scarlette Calico Primary Cardiologist: Dr. Aundra Dubin   HPI: [name] is a 52 year old male with a past medical history of NICM, EF 25-30% in March 2018, felt to be related to prior ETOH abuse. He also has a history of PE 03/2014 completed a years course of Xarelto, morbid obesity, OSA, and tobacco abuse.   He was admitted 11/12/15-11/14/15 with acute on chronic systolic CHF and palpitations. He wore a 30 day event monitor at discharge as he had frequent PVC's and questionable Afib on telemetry. Also with some NSVT, so he was started on Amiodaone, but at follow up had not started taking it. He had previously refused ICD and was seen inpatient by Dr. Rayann Heman who felt that his morbid obesity was a prohibitive factor.   He was seen in the clinic in April 2018. He had started drinking ETOH again. Volume status was stable, he is not an Entresto candidate due to angioedema with lisinopril. Weight was 411 pounds.   Admitted 04/12/17 with SOB, chest pain. D- dimer was 1.16, chest CT without central obstructing PE, however more peripheral and subsegmental pulmonary artery branches were not confidently evaluated due to his body habitus. He was started on a heparin gtt for presumed PE, however VQ scan showed no PE. Troponin was elevated, peaked at 3.37. LHC showed no CAD. Echo showed an EF of 15%, grade 2 DD, no pericarditis. He was diuresed with IV lasix, and started on torsemide 88m at discharge. Discharge weight was 405 pounds.   Admitted 5/22 through 04/24/2018 with abdominal pain, nausea, and vomiting. 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 Did not require surgical intervention. He will have follow up with GI.   Today he returns for HF follow up. He refused medication changes last visit. Has been discharged from paramedicine due to noncompliance. Was referred to cardiac rehab last visit. Overall doing poorly. He is having more frequent panic attacks, so he  restarted his zoloft. He has been having dizziness and weight loss since restarting. He also is unable to have an orgasm. He is not having CP or SOB with activity, but is having SOB, CP, and palpitations at rest and associates this with panic attacks. Wears BiPAP qHS. Appetite is poor. He is more fatigued and is very concerned about his weight loss. Weights have gone down from 410 to 388 on his home scale, sometimes dropping 5 lbs in one night. SBP 90s at home. Smoking 1 ppd. Discharged from HF paramedicine due to noncompliance .  Labs (5/13): K 4.1, creatinine 1.05 Labs (1/14): K 3.8, creatinine 1.16, BNP 54 Labs (2/14): K 3.7, creatinine 1.11, BNP 28 Labs (2/16): K 4.1, creatinine 1.03, LDL 96, HCT 40 Labs (3/16): K 3.7, K 1.13, BNP 367 Labs (8/16): BNP 43, K 3.5, creatinine 1.01 Labs (08/13/15): K 3.7, creatinine 1.18, HCT 41.3 Labs (12/16): K 3.7, creatinine 1.14, TGs 495, digoxin < 0.2 Labs (2/17): K 3.8, creatinine 0.99, LDL 107, TGs 222 Labs (5/17): K 4, creatinine 1.47 Labs (2/18): K 3.9, creatinine 1.06, hgb 15.1, TGs 163, LDL 89, HDL 28, TSH normal  Labs (5/18): K 3.8, creatinine 1.12.  Labs (12/30/2017): K 3.8 Creatinine 1.25  Labs (01/28/2018): K 3.8 Creatinine 1.08 Labs 03/19/2018: K 4.1 Creatinine 1/15 BNP 212  Labs 04/29/3018: Creatinine 1.1   PMH: 1. Nonischemic cardiomyopathy: Prior cath with no significant CAD.  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  Prior echoes with EF as  low as 25%.  Echo (9/13) with EF 35-40%, moderate to severe LV dilation, diffuse hypokinesis, mild MR. Echo (5/15) with EF 30-35%, moderate to severe LAE, normal RV size and systolic function.  Angioedema with ACEI, headaches with hydralazine/nitrates. Echo (3/16) with EF 25-30%, severe LV dilation, normal RV size and systolic function.  [facility] 08/13/15 showed no significant coronary disease; RA mean 6, PA 33/11 mean 23, PCWP mean 13, Fick CO/CI 4.75 /1.68 (difficult  study, radial artery spasm, if needs future cath would use groin).  Echo (9/16) showed EF 20-25%.   - Echo (3/18): EF 25-30%, moderate LAE - Echo 4/18 EF 15% - Echo 7/19 EF 20-25% 2. HTN: angioedema with ACEI.  3. OSA: on Bipap 4. Morbid obesity 5. Paroxysmal atrial fibrillation: Not documented recently.   6. Smoker.  7. Anxiety/panic attacks 8. PE: 5/15, diagnosed by V/Q scan. CTA chest 8/16 negative for PE.  9. NSVT, PVCs: 30 day monitor (12/16) with PVCs, PACs, no atrial fibrillation.  10. Hematuria: Apparently had negative workup by urology.  11. ABIs (6/16) were normal 12. 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  13. Gout 14. Low back pain.   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  Has son and daughter.    FH: No premature CAD.    Review of systems complete and found to be negative unless listed in HPI.    Current Outpatient Medications  Medication Sig Dispense Refill  . allopurinol (ZYLOPRIM) 100 MG tablet TAKE 1 TABLET BY MOUTH EVERY DAY 90 tablet 1  . aspirin EC 81 MG tablet Take 1 tablet (81 mg total) by mouth daily. 90 tablet 3  . blood glucose meter kit and supplies KIT Use to check blood sugar. DX E11.9 1 each 0  . clonazePAM (KLONOPIN) 1 MG tablet TAKE 1 TABLET BY MOUTH 3 TIMES DAILY AS NEEDED FOR ANXIETY (Patient taking differently: Take 1 mg by mouth 2 (two) times daily. TAKE 1 TABLET BY MOUTH 2 TIMES DAILY CAN TAKE ANOTHER TABLET AS NEEDED FOR ANXIETY) 90 tablet 2  . losartan (COZAAR) 50 MG tablet Take 50 mg by mouth daily.    . magnesium oxide (MAG-OX) 400 MG tablet Take 400 mg by mouth daily as needed (cramping).    . metoprolol succinate (TOPROL-XL) 100 MG 24 hr tablet Take 1 tablet (100 mg total) by mouth daily. Take with or immediately following a meal. 90 tablet 3  . NON FORMULARY Bipap, pressure 16/12 with 2L of O2    . pantoprazole (PROTONIX) 40 MG tablet TAKE 1 TABLET (40 MG TOTAL) BY MOUTH DAILY. 90  tablet 3  . potassium chloride SA (K-DUR,KLOR-CON) 20 MEQ tablet Take 20 mEq by mouth daily as needed (cramping).    . sertraline (ZOLOFT) 100 MG tablet Take 100 mg by mouth daily.    . SitaGLIPtin-MetFORMIN HCl (JANUMET XR) 50-1000 MG TB24 Take 2 tablets by mouth daily. 180 tablet 1  . spironolactone (ALDACTONE) 25 MG tablet Take 1 tablet (25 mg total) by mouth daily. 30 tablet 6  . thiamine (VITAMIN B-1) 100 MG tablet Take 1 tablet (100 mg total) by mouth daily. 90 tablet 1  . Tobramycin-Dexamethasone (TOBRADEX ST) 0.3-0.05 % SUSP Apply 1 drop to eye 3 (three) times daily. 5 mL 0  . torsemide (DEMADEX) 20 MG tablet Take 40 mg by mouth daily.     No current facility-administered medications for this encounter.     Allergies  Allergen Reactions  .  Ace Inhibitors Anaphylaxis and Swelling  . Buspirone Other (See Comments)    dizziness      Social History   Socioeconomic History  . Marital status: Married    Spouse name: Not on file  . Number of children: 3  . Years of education: Not on file  . Highest education level: Not on file  Occupational History  . Occupation: DISABLED  Social Needs  . Financial resource strain: Not on file  . Food insecurity:    Worry: Not on file    Inability: Not on file  . Transportation needs:    Medical: Not on file    Non-medical: Not on file  Tobacco Use  . Smoking status: Current Every Day Smoker    Packs/day: 0.50    Years: 30.00    Pack years: 15.00    Types: Cigarettes  . Smokeless tobacco: Never Used  . Tobacco comment: Took information today to call for help with support to quit   Substance and Sexual Activity  . Alcohol use: Yes    Alcohol/week: 0.0 standard drinks    Comment: Reports none in over a month, trying to quit   . Drug use: No  . Sexual activity: Not Currently  Lifestyle  . Physical activity:    Days per week: Not on file    Minutes per session: Not on file  . Stress: Not on file  Relationships  . Social  connections:    Talks on phone: Not on file    Gets together: Not on file    Attends religious service: Not on file    Active member of club or organization: Not on file    Attends meetings of clubs or organizations: Not on file    Relationship status: Not on file  . Intimate partner violence:    Fear of current or ex partner: Not on file    Emotionally abused: Not on file    Physically abused: Not on file    Forced sexual activity: Not on file  Other Topics Concern  . Not on file  Social History Narrative   He smokes about a pack per day and he has been smoking since he was 52 years of age.  He drinks alcohol occasionally, but he denies any illicit drug abuse.  He is presently on disability.    Lives with wife in a 2 story home.  Has 2 children.   Previously worked in [employer], last worked in 1998.   Highest level of education:  11th grade              Family History  Problem Relation Age of Onset  . Cancer Mother        brain tumor  . Hypertension Mother   . Diabetes Father        Deceased, 60  . Heart disease Maternal Grandmother   . Hypertension Unknown        Family History  . Stroke Unknown        Family History  . Diabetes Unknown        Family History  . Diabetes Daughter     There were no vitals filed for this visit. There were no vitals filed for this visit. Wt Readings from Last 3 Encounters:  08/24/18 (!) 178.7 kg (394 lb)  06/23/18 (!) 186.2 kg (410 lb 9.6 oz)  05/27/18 (!) 185.5 kg (409 lb)    PHYSICAL EXAM: General: Obese. No resp difficulty. HEENT: Normal Neck: Supple. JVP  flat. Carotids 2+ bilat; no bruits. No thyromegaly or nodule noted. Cor: PMI nondisplaced. RRR, No M/G/R noted Lungs: CTAB, normal effort. +dry cough Abdomen: obese, soft, non-tender, non-distended, no HSM. No bruits or masses. +BS  Extremities: No cyanosis, clubbing, or rash. R and LLE no edema.  Neuro: Alert & orientedx3, cranial nerves grossly intact. moves all 4  extremities w/o difficulty. Affect pleasant   ASSESSMENT & PLAN: 1. Chronic systolic CHF: Nonischemic cardiomyopathy. Echo (3/18) with EF 25-30%. He was seen by EP and decided against ICD given marked obesity.  QRS not wide enough for CRT. Echo 4/18 EF 15%.  - Echo 05/2018: EF 20-25%. Not an ICD candidate with marked obesity. - NYHA II-III.  - Volume status stable. Continue torsemide 40 mg daily. (most days takes 30 mg) - Continue Spiro 25 mg daily - Continue Toprol XL 152m  - Continue losartan 50 mg daily. He restarted this after last visit.  - No entresto with h/o angioedema.  - Had side effects from digoxin, does not want to take. - Headaches with Bidil, cannot take.  - Refer back to cardiac rehab maintenance program. Cannot afford $61/month copay. Plans to start Silver Sneakers - Discussed limiting fluid and salt intake.  - Paramedicine has signed off because pt refuses to be compliant with medications or diet, despite their efforts.   2. Obesity:  There is no height or weight on file to calculate BMI.  - He has information to pursue weight loss surgery.  3. Smoking:  - Smoking 1 ppd. Encouraged cessation. No change.  4. OSA:  - Continue nightly BiPAP. Compliant.   5. Paroxysmal atrial fibrillation:  - Regular on exam.  No change. Ziopatch as below. - Refuses anticoagulation. Has not been anticoagulated in 3 years.   6. PE: 04/14/2018 , diagnosed by V/Q scan.  He was on Xarelto for about a year but stopped in the setting of hematuria.  - Recent hospitalization without PE on CT and VQ scan. Not on anticoagulation. Does not want to anticoagulants. No change.   7. Anxiety/Panic attacks - Now back on zoloft, but having a lot of side effects. - Encouraged him to follow up with pysch. Placed Dr EJoycelyn Schmidnumber on AVS so he can call for follow up.   8. GERD  9. Palpitations - Sounds like this is related to anxiety/panic attacks. - Check 7 day ziopatch. BMET today.  CMET 7 day  Ziopatch Follow up in 2 months with Dr MLinus Mako NP 08/24/2018  Greater than 50% of the 25 minute visit was spent in counseling/coordination of care regarding disease state education, salt/fluid restriction, sliding scale diuretics, and medication compliance.  Patient seen with NP, agree with the above note.   Weight is down 22 lbs since last appointment, he does not look volume overloaded on exam.  He has not tolerated higher medication doses well, says he gets lightheaded.  - Continue torsemide 20 mg daily.  - Continue spironolactone 25 mg daily, Toprol XL 100 mg/day, and losartan 50 mg daily.  - BMET today.   He is not drinking any ETOH but still smokes. I encouraged him eo quit.   He has episodes of heart "racing" when he is under stress/anxious.  I will have him wear a Zio patch for 7 days to investigate.  He does not have an ICD, this was deferred due to his size.   DLoralie Champagne9/24/2019

## 2018-08-24 NOTE — Patient Instructions (Signed)
Routine lab work today. Will notify you of abnormal results, otherwise no news is good news!  Wear Zio patch for 7 days. Remove 08/31/2018 and mail back in provided packaging. We will call you 1-2 weeks after to give results.  Follow up 2 months with Dr. Aundra Dubin.  _________________________________________________________________ [name] Code:  1800  Take all medication as prescribed the day of your appointment. Bring all medications with you to your appointment.  Do the following things EVERYDAY: 1) Weigh yourself in the morning before breakfast. Write it down and keep it in a log. 2) Take your medicines as prescribed 3) Eat low salt foods-Limit salt (sodium) to 2000 mg per day.  4) Stay as active as you can everyday 5) Limit all fluids for the day to less than 2 liters

## 2018-08-31 ENCOUNTER — Encounter (HOSPITAL_COMMUNITY): Payer: Self-pay | Admitting: Cardiology

## 2018-08-31 ENCOUNTER — Telehealth (HOSPITAL_COMMUNITY): Payer: Self-pay | Admitting: Cardiology

## 2018-08-31 DIAGNOSIS — I509 Heart failure, unspecified: Secondary | ICD-10-CM | POA: Diagnosis not present

## 2018-08-31 DIAGNOSIS — I10 Essential (primary) hypertension: Secondary | ICD-10-CM | POA: Diagnosis not present

## 2018-08-31 DIAGNOSIS — G4733 Obstructive sleep apnea (adult) (pediatric): Secondary | ICD-10-CM | POA: Diagnosis not present

## 2018-08-31 NOTE — Telephone Encounter (Signed)
Incoming fax stating, patient must contact [facility] @ [phone] before they can schedule an appointment.  Letter mailed to patient to make him aware.   [facility] referral

## 2018-09-10 ENCOUNTER — Telehealth (HOSPITAL_COMMUNITY): Payer: Self-pay

## 2018-09-10 MED ORDER — METOPROLOL SUCCINATE ER 100 MG PO TB24: ORAL_TABLET | ORAL | 3 refills | Status: DC

## 2018-09-10 NOTE — Telephone Encounter (Signed)
Few short NSVT runs, may cause palpitations. Would try increasing Toprol XL to 100 qam/50 qpm

## 2018-09-14 ENCOUNTER — Other Ambulatory Visit (HOSPITAL_COMMUNITY): Payer: Self-pay | Admitting: Cardiology

## 2018-09-22 ENCOUNTER — Other Ambulatory Visit (HOSPITAL_COMMUNITY): Payer: Self-pay | Admitting: Student

## 2018-09-22 ENCOUNTER — Ambulatory Visit (INDEPENDENT_AMBULATORY_CARE_PROVIDER_SITE_OTHER): Payer: Medicare HMO | Admitting: Internal Medicine

## 2018-09-22 ENCOUNTER — Encounter: Payer: Self-pay | Admitting: Internal Medicine

## 2018-09-22 ENCOUNTER — Encounter

## 2018-09-22 ENCOUNTER — Other Ambulatory Visit (INDEPENDENT_AMBULATORY_CARE_PROVIDER_SITE_OTHER): Payer: Medicare HMO

## 2018-09-22 VITALS — BP 124/78 | HR 69 | Temp 98.2°F | Resp 16 | Ht 68.5 in | Wt >= 6400 oz

## 2018-09-22 DIAGNOSIS — F1010 Alcohol abuse, uncomplicated: Secondary | ICD-10-CM

## 2018-09-22 DIAGNOSIS — E785 Hyperlipidemia, unspecified: Secondary | ICD-10-CM | POA: Diagnosis not present

## 2018-09-22 DIAGNOSIS — E118 Type 2 diabetes mellitus with unspecified complications: Secondary | ICD-10-CM | POA: Diagnosis not present

## 2018-09-22 DIAGNOSIS — M1000 Idiopathic gout, unspecified site: Secondary | ICD-10-CM | POA: Diagnosis not present

## 2018-09-22 DIAGNOSIS — E781 Pure hyperglyceridemia: Secondary | ICD-10-CM

## 2018-09-22 DIAGNOSIS — R945 Abnormal results of liver function studies: Secondary | ICD-10-CM

## 2018-09-22 DIAGNOSIS — R69 Illness, unspecified: Secondary | ICD-10-CM | POA: Diagnosis not present

## 2018-09-22 DIAGNOSIS — L299 Pruritus, unspecified: Secondary | ICD-10-CM

## 2018-09-22 DIAGNOSIS — E519 Thiamine deficiency, unspecified: Secondary | ICD-10-CM

## 2018-09-22 DIAGNOSIS — K760 Fatty (change of) liver, not elsewhere classified: Secondary | ICD-10-CM | POA: Diagnosis not present

## 2018-09-22 DIAGNOSIS — N182 Chronic kidney disease, stage 2 (mild): Secondary | ICD-10-CM | POA: Diagnosis not present

## 2018-09-22 DIAGNOSIS — I10 Essential (primary) hypertension: Secondary | ICD-10-CM | POA: Diagnosis not present

## 2018-09-22 DIAGNOSIS — R7989 Other specified abnormal findings of blood chemistry: Secondary | ICD-10-CM

## 2018-09-22 LAB — COMPREHENSIVE METABOLIC PANEL
ALT: 37 U/L (ref 0–53)
AST: 25 U/L (ref 0–37)
Albumin: 4 g/dL (ref 3.5–5.2)
Alkaline Phosphatase: 233 U/L — ABNORMAL HIGH (ref 39–117)
BUN: 17 mg/dL (ref 6–23)
CHLORIDE: 102 meq/L (ref 96–112)
CO2: 26 mEq/L (ref 19–32)
Calcium: 9.5 mg/dL (ref 8.4–10.5)
Creatinine, Ser: 1.01 mg/dL (ref 0.40–1.50)
GFR: 100.56 mL/min (ref >60.00)
GLUCOSE: 201 mg/dL — AB (ref 70–99)
Potassium: 3.8 mEq/L (ref 3.5–5.1)
Sodium: 136 mEq/L (ref 135–145)
Total Bilirubin: 0.7 mg/dL (ref 0.2–1.2)
Total Protein: 8.4 g/dL — ABNORMAL HIGH (ref 6.0–8.3)

## 2018-09-22 LAB — POCT GLYCOSYLATED HEMOGLOBIN (HGB A1C): Hemoglobin A1C: 7 % — AB (ref 4.0–5.6)

## 2018-09-22 LAB — GAMMA GT: GGT: 378 U/L — ABNORMAL HIGH (ref 7–51)

## 2018-09-22 LAB — AMMONIA: AMMONIA: 47 umol/L — AB (ref 11–35)

## 2018-09-22 LAB — CBC WITH DIFFERENTIAL/PLATELET
BASOS PCT: 0.4 % (ref 0.0–3.0)
Basophils Absolute: 0 10*3/uL (ref 0.0–0.1)
EOS ABS: 0.4 10*3/uL (ref 0.0–0.7)
Eosinophils Relative: 4.2 % (ref 0.0–5.0)
HCT: 44.8 % (ref 39.0–52.0)
Hemoglobin: 15.7 g/dL (ref 13.0–17.0)
Lymphocytes Relative: 41.3 % (ref 12.0–46.0)
Lymphs Abs: 4 10*3/uL (ref 0.7–4.0)
MCHC: 35 g/dL (ref 30.0–36.0)
MCV: 86.4 fl (ref 78.0–100.0)
Monocytes Absolute: 0.8 10*3/uL (ref 0.1–1.0)
Monocytes Relative: 8.4 % (ref 3.0–12.0)
NEUTROS ABS: 4.4 10*3/uL (ref 1.4–7.7)
Neutrophils Relative %: 45.7 % (ref 43.0–77.0)
PLATELETS: 257 10*3/uL (ref 150.0–400.0)
RBC: 5.19 Mil/uL (ref 4.22–5.81)
RDW: 15 % (ref 11.5–15.5)
WBC: 9.7 10*3/uL (ref 4.0–10.5)

## 2018-09-22 LAB — LIPID PANEL
Cholesterol: 241 mg/dL — ABNORMAL HIGH (ref 0–200)
HDL: 28.9 mg/dL — AB (ref >39.00)
NONHDL: 212.4
Total CHOL/HDL Ratio: 8
Triglycerides: 332 mg/dL — ABNORMAL HIGH (ref 0.0–149.0)
VLDL: 66.4 mg/dL — AB (ref 0.0–40.0)

## 2018-09-22 LAB — PROTIME-INR
INR: 1 ratio (ref 0.8–1.0)
Prothrombin Time: 11.9 s (ref 9.6–13.1)

## 2018-09-22 LAB — POCT GLUCOSE (DEVICE FOR HOME USE): GLUCOSE FASTING, POC: 210 mg/dL — AB (ref 70–99)

## 2018-09-22 LAB — LDL CHOLESTEROL, DIRECT: LDL DIRECT: 166 mg/dL

## 2018-09-22 MED ORDER — ATORVASTATIN CALCIUM 40 MG PO TABS: 40.0000 mg | ORAL_TABLET | Freq: Every day | ORAL | 1 refills | Status: DC

## 2018-09-22 MED ORDER — METHYLPREDNISOLONE 4 MG PO TBPK: ORAL_TABLET | ORAL | 0 refills | Status: DC

## 2018-09-22 MED ORDER — HYDROXYZINE HCL 10 MG PO TABS: 10.0000 mg | ORAL_TABLET | Freq: Four times a day (QID) | ORAL | 2 refills | Status: DC | PRN

## 2018-09-22 MED ORDER — ICOSAPENT ETHYL 1 G PO CAPS: 2.0000 | ORAL_CAPSULE | Freq: Two times a day (BID) | ORAL | 1 refills | Status: DC

## 2018-09-22 NOTE — Patient Instructions (Signed)

## 2018-09-22 NOTE — Progress Notes (Signed)
Subjective:  Patient ID: [name], male    DOB: 04-16-1966  Age: 52 y.o. MRN: [id number]  CC: Hypertension; Diabetes; and Hyperlipidemia   HPI CAYETANO MIKITA presents for f/up- He saw his cardiologist about a month ago and had elevated liver enzymes.  He was concerned it might be related to the losartan or sertraline so he stopped taking both.  He also tells me he has abstained from alcohol for several months.  He complains of unexplained weight loss but denies abdominal pain, nausea, vomiting, or icterus.  He does complain of fatigue and itching with dry skin.  He has intermittent lower extremity edema which responds well to diuretics.  He was previously told by GI that he needed to undergo a liver biopsy but he is not willing to do that.  Instead he wants to do an MRI to see what his liver looks like.  Outpatient Medications Prior to Visit  Medication Sig Dispense Refill  . blood glucose meter kit and supplies KIT Use to check blood sugar. DX E11.9 1 each 0  . clonazePAM (KLONOPIN) 1 MG tablet TAKE 1 TABLET BY MOUTH 3 TIMES DAILY AS NEEDED FOR ANXIETY (Patient taking differently: Take 1 mg by mouth 2 (two) times daily. TAKE 1 TABLET BY MOUTH 2 TIMES DAILY CAN TAKE ANOTHER TABLET AS NEEDED FOR ANXIETY) 90 tablet 2  . magnesium oxide (MAG-OX) 400 MG tablet Take 400 mg by mouth daily as needed (cramping).    . metoprolol succinate (TOPROL-XL) 100 MG 24 hr tablet Take 182m in the am and take 50 mg in the pm. Take with or immediately following a meal. 135 tablet 3  . NON FORMULARY Bipap, pressure 16/12 with 2L of O2    . pantoprazole (PROTONIX) 40 MG tablet TAKE 1 TABLET (40 MG TOTAL) BY MOUTH DAILY. 90 tablet 3  . potassium chloride SA (K-DUR,KLOR-CON) 20 MEQ tablet Take 20 mEq by mouth daily as needed (cramping).    . sildenafil (REVATIO) 20 MG tablet Take 20 mg by mouth daily as needed.  3  . spironolactone (ALDACTONE) 25 MG tablet TAKE 1 TABLET BY MOUTH EVERY DAY 90 tablet 3  .  thiamine (VITAMIN B-1) 100 MG tablet Take 1 tablet (100 mg total) by mouth daily. 90 tablet 1  . torsemide (DEMADEX) 20 MG tablet Take 40 mg by mouth daily.    .Marland Kitchenaspirin EC 81 MG tablet Take 1 tablet (81 mg total) by mouth daily. 90 tablet 3  . Tobramycin-Dexamethasone (TOBRADEX ST) 0.3-0.05 % SUSP Apply 1 drop to eye 3 (three) times daily. 5 mL 0  . allopurinol (ZYLOPRIM) 100 MG tablet TAKE 1 TABLET BY MOUTH EVERY DAY (Patient not taking: Reported on 09/22/2018) 90 tablet 1  . losartan (COZAAR) 50 MG tablet Take 50 mg by mouth daily.    . sertraline (ZOLOFT) 100 MG tablet Take 100 mg by mouth daily.    . SitaGLIPtin-MetFORMIN HCl (JANUMET XR) 50-1000 MG TB24 Take 2 tablets by mouth daily. (Patient not taking: Reported on 09/22/2018) 180 tablet 1   No facility-administered medications prior to visit.     ROS Review of Systems  Constitutional: Positive for appetite change, fatigue and unexpected weight change (wt loss). Negative for activity change.  Eyes: Negative for visual disturbance.  Respiratory: Negative for cough, chest tightness, shortness of breath and wheezing.   Cardiovascular: Positive for leg swelling. Negative for chest pain and palpitations.  Gastrointestinal: Negative for abdominal pain, blood in stool, constipation, diarrhea, nausea  and vomiting.  Genitourinary: Negative.  Negative for difficulty urinating.  Musculoskeletal: Positive for arthralgias. Negative for back pain and myalgias.       Pain and swelling in left foot at the base of the great toe. No recent injury.  Skin: Negative.  Negative for color change, pallor and rash.  Neurological: Negative for dizziness, weakness, light-headedness and headaches.  Hematological: Negative for adenopathy. Does not bruise/bleed easily.  Psychiatric/Behavioral: Negative.  Negative for dysphoric mood and sleep disturbance. The patient is not nervous/anxious.     Objective:  BP 124/78 (BP Location: Left Arm, Patient Position:  Sitting, Cuff Size: Normal)   Pulse 69   Temp 98.2 F (36.8 C) (Oral)   Resp 16   Ht 5' 8.5" (1.74 m)   Wt (!) 400 lb (181.4 kg)   SpO2 95%   BMI 59.94 kg/m   BP Readings from Last 3 Encounters:  09/22/18 124/78  08/24/18 116/72  06/23/18 128/82    Wt Readings from Last 3 Encounters:  09/22/18 (!) 400 lb (181.4 kg)  08/24/18 (!) 394 lb (178.7 kg)  06/23/18 (!) 410 lb 9.6 oz (186.2 kg)    Physical Exam  Constitutional: He is oriented to person, place, and time. No distress.  HENT:  Mouth/Throat: Oropharynx is clear and moist.  Eyes: Conjunctivae are normal. No scleral icterus.  Neck: Normal range of motion. Neck supple. No JVD present. No thyromegaly present.  Cardiovascular: Normal rate, regular rhythm and normal heart sounds. Exam reveals no gallop.  No murmur heard. Pulmonary/Chest: Effort normal and breath sounds normal. No respiratory distress. He has no wheezes. He has no rhonchi. He has no rales.  Abdominal: Soft. Bowel sounds are normal. He exhibits no mass. There is no hepatosplenomegaly. There is no tenderness. No hernia.  Musculoskeletal: Normal range of motion. He exhibits no edema, tenderness or deformity.       Feet:  There is no edema today  Neurological: He is alert and oriented to person, place, and time.  Skin: Skin is warm and dry. No rash noted. He is not diaphoretic.  There is xerosis but no epidermal lesions  Psychiatric: He has a normal mood and affect. His behavior is normal. Judgment and thought content normal.  Vitals reviewed.   Lab Results  Component Value Date   WBC 9.7 09/22/2018   HGB 15.7 09/22/2018   HCT 44.8 09/22/2018   PLT 257.0 09/22/2018   GLUCOSE 201 (H) 09/22/2018   CHOL 241 (H) 09/22/2018   TRIG 332.0 (H) 09/22/2018   HDL 28.90 (L) 09/22/2018   LDLDIRECT 166.0 09/22/2018   LDLCALC 89 01/15/2017   ALT 37 09/22/2018   AST 25 09/22/2018   NA 136 09/22/2018   K 3.8 09/22/2018   CL 102 09/22/2018   CREATININE 1.01  09/22/2018   BUN 17 09/22/2018   CO2 26 09/22/2018   TSH 1.89 02/02/2018   PSA 0.38 04/21/2018   INR 1.0 09/22/2018   HGBA1C 7.0 (A) 09/22/2018   MICROALBUR 0.8 02/02/2018    No results found.  Assessment & Plan:   Jettie was seen today for hypertension, diabetes and hyperlipidemia.  Diagnoses and all orders for this visit:  Essential hypertension- His BP is adequately well controlled. -     Comprehensive metabolic panel; Future -     Urine drugs of abuse scrn w alc, routine (Ref Lab); Future  Type II diabetes mellitus with manifestations ([place])- His A1c is at 7% on no medical therapy.  His blood sugars are  adequately well controlled. -     Comprehensive metabolic panel; Future -     Cancel: Hemoglobin A1c; Future -     HM Diabetes Foot Exam -     POCT glycosylated hemoglobin (Hb A1C) -     POCT Glucose (Device for Home Use)  Thiamin deficiency- I will monitor his H&H and his B1 level. -     CBC with Differential/Platelet; Future -     Vitamin B1; Future  Alcohol abuse- His LFT's are better.  He was praised for abstaining from alcohol.  Will check his urine to confirm that there has been no recent alcohol intake. -     Comprehensive metabolic panel; Future -     Gamma GT; Future -     Urine drugs of abuse scrn w alc, routine (Ref Lab); Future  Fatty liver disease, nonalcoholic- His LFTs are much better.  I have asked him to undergo an MRI to confirm the presence of fatty liver disease and to screen for fibrosis, an obstructive process, mass, retained stone. -     Comprehensive metabolic panel; Future -     Gamma GT; Future -     Ammonia; Future -     Protime-INR; Future -     MR LIVER W WO CONTRAST; Future -     Urine drugs of abuse scrn w alc, routine (Ref Lab); Future  Hyperlipidemia with target LDL less than 100- He has a significantly elevated ASCVD risk score.  I have asked him to start taking a statin for CV risk reduction. -     Lipid panel; Future -      atorvastatin (LIPITOR) 40 MG tablet; Take 1 tablet (40 mg total) by mouth daily.  Hypertriglyceridemia- He has a very elevated ASCVD risk score so I have asked him to start taking VASCEPA to reduce the complications of pancreatitis and for CV risk reduction. -     Lipid panel; Future -     Icosapent Ethyl (VASCEPA) 1 g CAPS; Take 2 capsules (2 g total) by mouth 2 (two) times daily.  CKD (chronic kidney disease), stage II- Based on the C-G formula his renal function is normal. -     Urine drugs of abuse scrn w alc, routine (Ref Lab); Future  Elevated LFTs- His LFTs are normal with the exception of a mildly elevated GGT.  His ammonia level is slightly elevated, PT/INR are normal.  He does not appear at this time to have hepatic encephalopathy.  I will screen his urine for alcohol exposure. -     Ammonia; Future -     Protime-INR; Future -     MR LIVER W WO CONTRAST; Future  Acute idiopathic gout, unspecified site -     methylPREDNISolone (MEDROL DOSEPAK) 4 MG TBPK tablet; TAKE AS DIRECTED  Itching -     hydrOXYzine (ATARAX/VISTARIL) 10 MG tablet; Take 1 tablet (10 mg total) by mouth every 6 (six) hours as needed.   I have discontinued Maddoxx L. Finnicum's aspirin EC, Tobramycin-Dexamethasone, SitaGLIPtin-MetFORMIN HCl, losartan, and sertraline. I am also having him start on methylPREDNISolone, hydrOXYzine, atorvastatin, and Icosapent Ethyl. Additionally, I am having him maintain his NON FORMULARY, potassium chloride SA, magnesium oxide, pantoprazole, clonazePAM, thiamine, blood glucose meter kit and supplies, allopurinol, torsemide, metoprolol succinate, spironolactone, and sildenafil.  Meds ordered this encounter  Medications  . methylPREDNISolone (MEDROL DOSEPAK) 4 MG TBPK tablet    Sig: TAKE AS DIRECTED    Dispense:  21 tablet  Refill:  0  . hydrOXYzine (ATARAX/VISTARIL) 10 MG tablet    Sig: Take 1 tablet (10 mg total) by mouth every 6 (six) hours as needed.    Dispense:  100 tablet     Refill:  2  . atorvastatin (LIPITOR) 40 MG tablet    Sig: Take 1 tablet (40 mg total) by mouth daily.    Dispense:  90 tablet    Refill:  1  . Icosapent Ethyl (VASCEPA) 1 g CAPS    Sig: Take 2 capsules (2 g total) by mouth 2 (two) times daily.    Dispense:  360 capsule    Refill:  1     Follow-up: Return in about 1 week (around 09/29/2018).  Scarlette Calico, MD

## 2018-09-23 LAB — URINE DRUGS OF ABUSE SCREEN W ALC, ROUTINE (REF LAB)
Amphetamines, Urine: NEGATIVE ng/mL
Barbiturate Quant, Ur: NEGATIVE ng/mL
Benzodiazepine Quant, Ur: NEGATIVE ng/mL
Cannabinoid Quant, Ur: NEGATIVE ng/mL
Cocaine (Metab.): NEGATIVE ng/mL
Ethanol, Urine: NEGATIVE %
METHADONE SCREEN, URINE: NEGATIVE ng/mL
Opiate Quant, Ur: NEGATIVE ng/mL
PCP QUANT UR: NEGATIVE ng/mL
PROPOXYPHENE: NEGATIVE ng/mL

## 2018-09-24 ENCOUNTER — Encounter: Payer: Self-pay | Admitting: Internal Medicine

## 2018-09-28 LAB — VITAMIN B1: Vitamin B1 (Thiamine): 12 nmol/L (ref 8–30)

## 2018-09-29 ENCOUNTER — Ambulatory Visit (INDEPENDENT_AMBULATORY_CARE_PROVIDER_SITE_OTHER): Payer: Medicare HMO | Admitting: Internal Medicine

## 2018-09-29 ENCOUNTER — Other Ambulatory Visit: Payer: Medicare HMO

## 2018-09-29 ENCOUNTER — Telehealth: Payer: Self-pay | Admitting: Internal Medicine

## 2018-09-29 ENCOUNTER — Encounter: Payer: Self-pay | Admitting: Internal Medicine

## 2018-09-29 VITALS — BP 120/80 | HR 73 | Temp 98.0°F | Resp 16 | Ht 68.5 in | Wt 397.0 lb

## 2018-09-29 DIAGNOSIS — L02412 Cutaneous abscess of left axilla: Secondary | ICD-10-CM | POA: Diagnosis not present

## 2018-09-29 DIAGNOSIS — R69 Illness, unspecified: Secondary | ICD-10-CM | POA: Diagnosis not present

## 2018-09-29 DIAGNOSIS — F411 Generalized anxiety disorder: Secondary | ICD-10-CM

## 2018-09-29 DIAGNOSIS — F3162 Bipolar disorder, current episode mixed, moderate: Secondary | ICD-10-CM

## 2018-09-29 MED ORDER — DOXYCYCLINE HYCLATE 100 MG PO TBEC: 100.0000 mg | DELAYED_RELEASE_TABLET | Freq: Two times a day (BID) | ORAL | 0 refills | Status: DC

## 2018-09-29 MED ORDER — TRAZODONE HCL 50 MG PO TABS: 50.0000 mg | ORAL_TABLET | Freq: Every day | ORAL | 1 refills | Status: DC

## 2018-09-29 MED ORDER — DOXYCYCLINE HYCLATE 100 MG PO TABS: 100.0000 mg | ORAL_TABLET | Freq: Two times a day (BID) | ORAL | 0 refills | Status: DC

## 2018-09-29 MED ORDER — CLONAZEPAM 1 MG PO TABS: ORAL_TABLET | ORAL | 3 refills | Status: DC

## 2018-09-29 NOTE — Telephone Encounter (Signed)
Copied from [place] [id number]. Topic: Quick Communication - Rx Refill/Question >> Sep 29, 2018 11:21 AM Burchel, Abbi R wrote: Medication: doxycycline (DORYX) 100 MG EC tablet  Preferred Pharmacy: [id number] - [place], [place] [address] Phone: [phone] Fax: [phone]  Pt states his insurance will not cover this medication.  Can [name] Substitute?  Pt:  [phone]

## 2018-09-29 NOTE — Progress Notes (Signed)
Subjective:  Patient ID: [name], male    DOB: 10/09/1966  Age: 52 y.o. MRN: [id number]  CC: Recurrent Skin Infections   HPI [name] presents for concerns about an area of pain and swelling in his left armpit that is been present for several days.  Outpatient Medications Prior to Visit  Medication Sig Dispense Refill  . allopurinol (ZYLOPRIM) 100 MG tablet TAKE 1 TABLET BY MOUTH EVERY DAY 90 tablet 1  . atorvastatin (LIPITOR) 40 MG tablet Take 1 tablet (40 mg total) by mouth daily. 90 tablet 1  . blood glucose meter kit and supplies KIT Use to check blood sugar. DX E11.9 1 each 0  . fenofibrate 54 MG tablet TAKE 1 TABLET (54 MG TOTAL) BY MOUTH DAILY. 90 tablet 3  . hydrOXYzine (ATARAX/VISTARIL) 10 MG tablet Take 1 tablet (10 mg total) by mouth every 6 (six) hours as needed. 100 tablet 2  . Icosapent Ethyl (VASCEPA) 1 g CAPS Take 2 capsules (2 g total) by mouth 2 (two) times daily. 360 capsule 1  . magnesium oxide (MAG-OX) 400 MG tablet Take 400 mg by mouth daily as needed (cramping).    . metoprolol succinate (TOPROL-XL) 100 MG 24 hr tablet Take 134m in the am and take 50 mg in the pm. Take with or immediately following a meal. 135 tablet 3  . NON FORMULARY Bipap, pressure 16/12 with 2L of O2    . pantoprazole (PROTONIX) 40 MG tablet TAKE 1 TABLET (40 MG TOTAL) BY MOUTH DAILY. 90 tablet 3  . potassium chloride SA (K-DUR,KLOR-CON) 20 MEQ tablet Take 20 mEq by mouth daily as needed (cramping).    . sildenafil (REVATIO) 20 MG tablet Take 20 mg by mouth daily as needed.  3  . spironolactone (ALDACTONE) 25 MG tablet TAKE 1 TABLET BY MOUTH EVERY DAY 90 tablet 3  . thiamine (VITAMIN B-1) 100 MG tablet Take 1 tablet (100 mg total) by mouth daily. 90 tablet 1  . torsemide (DEMADEX) 20 MG tablet Take 40 mg by mouth daily.    . clonazePAM (KLONOPIN) 1 MG tablet TAKE 1 TABLET BY MOUTH 3 TIMES DAILY AS NEEDED FOR ANXIETY (Patient taking differently: Take 1 mg by mouth 2 (two) times  daily. TAKE 1 TABLET BY MOUTH 2 TIMES DAILY CAN TAKE ANOTHER TABLET AS NEEDED FOR ANXIETY) 90 tablet 2  . methylPREDNISolone (MEDROL DOSEPAK) 4 MG TBPK tablet TAKE AS DIRECTED 21 tablet 0   No facility-administered medications prior to visit.     ROS Review of Systems  Constitutional: Negative for chills and fever.  HENT: Negative.   Eyes: Negative for visual disturbance.  Respiratory: Negative for cough, chest tightness, shortness of breath and wheezing.   Cardiovascular: Negative for chest pain, palpitations and leg swelling.  Gastrointestinal: Negative for abdominal pain, diarrhea, nausea and vomiting.  Genitourinary: Negative.   Musculoskeletal: Negative.  Negative for arthralgias and myalgias.  Skin: Positive for wound.  Hematological: Negative for adenopathy. Does not bruise/bleed easily.  Psychiatric/Behavioral: Positive for sleep disturbance. Negative for confusion, decreased concentration, dysphoric mood, self-injury and suicidal ideas. The patient is nervous/anxious. The patient is not hyperactive.        + panic attacks    Objective:  BP 120/80 (BP Location: Left Arm, Patient Position: Sitting, Cuff Size: Large)   Pulse 73   Temp 98 F (36.7 C) (Oral)   Resp 16   Ht 5' 8.5" (1.74 m)   Wt (!) 397 lb (180.1 kg)  SpO2 96%   BMI 59.49 kg/m   BP Readings from Last 3 Encounters:  09/29/18 120/80  09/22/18 124/78  08/24/18 116/72    Wt Readings from Last 3 Encounters:  09/29/18 (!) 397 lb (180.1 kg)  09/22/18 (!) 400 lb (181.4 kg)  08/24/18 (!) 394 lb (178.7 kg)    Physical Exam  Constitutional: No distress.  HENT:  Mouth/Throat: Oropharynx is clear and moist. No oropharyngeal exudate.  Eyes: Conjunctivae are normal.  Neck: Normal range of motion. Neck supple. No JVD present. No thyromegaly present.  Cardiovascular: Normal rate, regular rhythm and normal heart sounds. Exam reveals no gallop.  No murmur heard. Pulmonary/Chest: Effort normal and breath sounds  normal. No respiratory distress. He has no wheezes. He has no rales.  Abdominal: Soft. Bowel sounds are normal. He exhibits no mass. There is no hepatosplenomegaly. There is no tenderness.  Musculoskeletal: Normal range of motion. He exhibits no edema, tenderness or deformity.  Lymphadenopathy:    He has no cervical adenopathy.  Skin: He is not diaphoretic.  Left axilla, lateral aspect reveals a 1.5 cm area of subcutaneous induration and fluctuance.  Palpation produces a copious amount of thick purulent exudate.  A culture is sent.  Vitals reviewed.   Lab Results  Component Value Date   WBC 9.7 09/22/2018   HGB 15.7 09/22/2018   HCT 44.8 09/22/2018   PLT 257.0 09/22/2018   GLUCOSE 201 (H) 09/22/2018   CHOL 241 (H) 09/22/2018   TRIG 332.0 (H) 09/22/2018   HDL 28.90 (L) 09/22/2018   LDLDIRECT 166.0 09/22/2018   LDLCALC 89 01/15/2017   ALT 37 09/22/2018   AST 25 09/22/2018   NA 136 09/22/2018   K 3.8 09/22/2018   CL 102 09/22/2018   CREATININE 1.01 09/22/2018   BUN 17 09/22/2018   CO2 26 09/22/2018   TSH 1.89 02/02/2018   PSA 0.38 04/21/2018   INR 1.0 09/22/2018   HGBA1C 7.0 (A) 09/22/2018   MICROALBUR 0.8 02/02/2018    No results found.  Assessment & Plan:   [name] was seen today for recurrent skin infections.  Diagnoses and all orders for this visit:  Abscess of left axilla- An adequate I&D was performed.  The cavity is too small to be packed with iodoform.  This looks like MRSA.  Will culture for confirmation and in the meantime empirically treat with doxycycline. -     WOUND CULTURE; Future -     Discontinue: doxycycline (DORYX) 100 MG EC tablet; Take 1 tablet (100 mg total) by mouth 2 (two) times daily for 10 days.  GAD (generalized anxiety disorder) -     clonazePAM (KLONOPIN) 1 MG tablet; TAKE 1 TABLET BY MOUTH 3 TIMES DAILY AS NEEDED FOR ANXIETY -     traZODone (DESYREL) 50 MG tablet; Take 1 tablet (50 mg total) by mouth at bedtime.  Bipolar disorder,  current episode mixed, moderate ([name])   I have discontinued [name]'s methylPREDNISolone. I am also having him start on traZODone. Additionally, I am having him maintain his NON FORMULARY, potassium chloride SA, magnesium oxide, pantoprazole, thiamine, blood glucose meter kit and supplies, allopurinol, torsemide, metoprolol succinate, spironolactone, sildenafil, hydrOXYzine, atorvastatin, Icosapent Ethyl, fenofibrate, and clonazePAM.  Meds ordered this encounter  Medications  . DISCONTD: doxycycline (DORYX) 100 MG EC tablet    Sig: Take 1 tablet (100 mg total) by mouth 2 (two) times daily for 10 days.    Dispense:  20 tablet    Refill:  0  .  clonazePAM (KLONOPIN) 1 MG tablet    Sig: TAKE 1 TABLET BY MOUTH 3 TIMES DAILY AS NEEDED FOR ANXIETY    Dispense:  90 tablet    Refill:  3  . traZODone (DESYREL) 50 MG tablet    Sig: Take 1 tablet (50 mg total) by mouth at bedtime.    Dispense:  90 tablet    Refill:  1     Follow-up: Return if symptoms worsen or fail to improve.  Scarlette Calico, MD

## 2018-09-29 NOTE — Patient Instructions (Signed)

## 2018-09-29 NOTE — Telephone Encounter (Signed)
Contacted pharmacy and confirmed rx was for the extended release doxy (Doryx) and the vibra tabs are the immediate release and insurance will cover that. Erx sent.

## 2018-10-01 DIAGNOSIS — I509 Heart failure, unspecified: Secondary | ICD-10-CM | POA: Diagnosis not present

## 2018-10-01 DIAGNOSIS — I10 Essential (primary) hypertension: Secondary | ICD-10-CM | POA: Diagnosis not present

## 2018-10-01 DIAGNOSIS — G4733 Obstructive sleep apnea (adult) (pediatric): Secondary | ICD-10-CM | POA: Diagnosis not present

## 2018-10-01 LAB — WOUND CULTURE
MICRO NUMBER:: 91306032
SPECIMEN QUALITY: ADEQUATE

## 2018-10-04 ENCOUNTER — Other Ambulatory Visit: Payer: Self-pay | Admitting: Internal Medicine

## 2018-10-04 DIAGNOSIS — R945 Abnormal results of liver function studies: Principal | ICD-10-CM

## 2018-10-04 DIAGNOSIS — R7989 Other specified abnormal findings of blood chemistry: Secondary | ICD-10-CM

## 2018-10-05 ENCOUNTER — Encounter: Payer: Self-pay | Admitting: Gastroenterology

## 2018-10-06 ENCOUNTER — Other Ambulatory Visit: Payer: Self-pay

## 2018-10-13 ENCOUNTER — Other Ambulatory Visit: Payer: Self-pay | Admitting: Family Medicine

## 2018-10-18 ENCOUNTER — Encounter (HOSPITAL_COMMUNITY): Payer: Self-pay | Admitting: Cardiology

## 2018-10-18 ENCOUNTER — Ambulatory Visit (HOSPITAL_COMMUNITY)
Admission: RE | Admit: 2018-10-18 | Discharge: 2018-10-18 | Disposition: A | Discharge status: Home / Self Care | Payer: Medicare HMO | Source: Ambulatory Visit | Attending: Cardiology | Admitting: Cardiology

## 2018-10-18 VITALS — BP 120/90 | HR 66 | Wt >= 6400 oz

## 2018-10-18 DIAGNOSIS — Z79899 Other long term (current) drug therapy: Secondary | ICD-10-CM | POA: Insufficient documentation

## 2018-10-18 DIAGNOSIS — G4733 Obstructive sleep apnea (adult) (pediatric): Secondary | ICD-10-CM | POA: Insufficient documentation

## 2018-10-18 DIAGNOSIS — F419 Anxiety disorder, unspecified: Secondary | ICD-10-CM

## 2018-10-18 DIAGNOSIS — Z7982 Long term (current) use of aspirin: Secondary | ICD-10-CM | POA: Insufficient documentation

## 2018-10-18 DIAGNOSIS — I110 Hypertensive heart disease with heart failure: Secondary | ICD-10-CM | POA: Insufficient documentation

## 2018-10-18 DIAGNOSIS — I428 Other cardiomyopathies: Secondary | ICD-10-CM | POA: Diagnosis not present

## 2018-10-18 DIAGNOSIS — F17210 Nicotine dependence, cigarettes, uncomplicated: Secondary | ICD-10-CM | POA: Diagnosis not present

## 2018-10-18 DIAGNOSIS — I5042 Chronic combined systolic (congestive) and diastolic (congestive) heart failure: Secondary | ICD-10-CM | POA: Diagnosis not present

## 2018-10-18 DIAGNOSIS — I5022 Chronic systolic (congestive) heart failure: Secondary | ICD-10-CM | POA: Diagnosis not present

## 2018-10-18 DIAGNOSIS — F17200 Nicotine dependence, unspecified, uncomplicated: Secondary | ICD-10-CM | POA: Diagnosis not present

## 2018-10-18 DIAGNOSIS — Z86711 Personal history of pulmonary embolism: Secondary | ICD-10-CM | POA: Diagnosis not present

## 2018-10-18 DIAGNOSIS — R69 Illness, unspecified: Secondary | ICD-10-CM | POA: Diagnosis not present

## 2018-10-18 DIAGNOSIS — I480 Paroxysmal atrial fibrillation: Secondary | ICD-10-CM | POA: Insufficient documentation

## 2018-10-18 LAB — COMPREHENSIVE METABOLIC PANEL
ALK PHOS: 109 U/L (ref 38–126)
ALT: 23 U/L (ref 0–44)
AST: 18 U/L (ref 15–41)
Albumin: 3.5 g/dL (ref 3.5–5.0)
Anion gap: 6 (ref 5–15)
BILIRUBIN TOTAL: 0.9 mg/dL (ref 0.3–1.2)
BUN: 13 mg/dL (ref 6–20)
CALCIUM: 9 mg/dL (ref 8.9–10.3)
CHLORIDE: 107 mmol/L (ref 98–111)
CO2: 24 mmol/L (ref 22–32)
CREATININE: 1.02 mg/dL (ref 0.61–1.24)
Glucose, Bld: 149 mg/dL — ABNORMAL HIGH (ref 70–99)
Potassium: 3.9 mmol/L (ref 3.5–5.1)
Sodium: 137 mmol/L (ref 135–145)
TOTAL PROTEIN: 7.4 g/dL (ref 6.5–8.1)

## 2018-10-18 MED ORDER — METOPROLOL SUCCINATE ER 100 MG PO TB24: ORAL_TABLET | ORAL | 3 refills | Status: DC

## 2018-10-18 MED ORDER — LOSARTAN POTASSIUM 50 MG PO TABS: 50.0000 mg | ORAL_TABLET | Freq: Every day | ORAL | 3 refills | Status: DC

## 2018-10-18 MED ORDER — METOPROLOL SUCCINATE ER 50 MG PO TB24: ORAL_TABLET | ORAL | 6 refills | Status: DC

## 2018-10-18 NOTE — Patient Instructions (Signed)
INCREASE Toprol XL 100mg  in the morning and 50mg  in the evening  RESTART Losartan 50mg  daily.   Labs today We will only contact you if something comes back abnormal or we need to make some changes. Otherwise no news is good news!  Your physician recommends that you schedule a follow-up appointment in: 2 months with Dr. Aundra Dubin

## 2018-10-19 ENCOUNTER — Telehealth (HOSPITAL_COMMUNITY): Payer: Self-pay

## 2018-10-19 NOTE — Telephone Encounter (Signed)
Pt called and informed to restart atorvastatin, pt verbalized understanding

## 2018-10-19 NOTE — Progress Notes (Signed)
Advanced Heart Failure Clinic Note   Primary Care:Dr. Scarlette Calico Primary Cardiologist: Dr. Aundra Dubin   HPI: [name] is a 52 y.o. male with a past medical history of NICM, EF 25-30% in March 2018, felt to be related to prior ETOH abuse. He also has a history of PE 03/2014 completed a years course of Xarelto, morbid obesity, OSA, and tobacco abuse.   He was admitted 11/12/15-11/14/15 with acute on chronic systolic CHF and palpitations. He wore a 30 day event monitor at discharge as he had frequent PVC's and questionable Afib on telemetry. Also with some NSVT, so he was started on Amiodaone, but at follow up had not started taking it. He had previously refused ICD and was seen inpatient by Dr. Rayann Heman who felt that his morbid obesity was a prohibitive factor.   He was seen in the clinic in April 2018. He had started drinking ETOH again. Volume status was stable, he is not an Entresto candidate due to angioedema with lisinopril. Weight was 411 pounds.   Admitted 04/12/17 with SOB, chest pain. D- dimer was 1.16, chest CT without central obstructing PE, however more peripheral and subsegmental pulmonary artery branches were not confidently evaluated due to his body habitus. He was started on a heparin gtt for presumed PE, however VQ scan showed no PE. Troponin was elevated, peaked at 3.37. LHC showed no CAD. Echo showed an EF of 15%, grade 2 DD, no pericarditis. He was diuresed with IV lasix, and started on torsemide 6m at discharge. Discharge weight was 405 pounds.   Admitted 5/22 through 04/24/2018 with abdominal pain, nausea, and vomiting. 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 Did not require surgical intervention. He will have follow up with GI.   Today he returns for HF follow up. Had recent rise in LFTs that appears to have resolved on today's labs.  He feels weak at times.  He is short of breath with heavy exertion but not usual ADLs.  Not drinking any ETOH. No orthopnea/PND.  No chest pain. Still  smoking 1 ppd.   ECG (personally reviewed): NSR, IVCD 132 msec  Labs (5/13): K 4.1, creatinine 1.05 Labs (1/14): K 3.8, creatinine 1.16, BNP 54 Labs (2/14): K 3.7, creatinine 1.11, BNP 28 Labs (2/16): K 4.1, creatinine 1.03, LDL 96, HCT 40 Labs (3/16): K 3.7, K 1.13, BNP 367 Labs (8/16): BNP 43, K 3.5, creatinine 1.01 Labs (08/13/15): K 3.7, creatinine 1.18, HCT 41.3 Labs (12/16): K 3.7, creatinine 1.14, TGs 495, digoxin < 0.2 Labs (2/17): K 3.8, creatinine 0.99, LDL 107, TGs 222 Labs (5/17): K 4, creatinine 1.47 Labs (2/18): K 3.9, creatinine 1.06, hgb 15.1, TGs 163, LDL 89, HDL 28, TSH normal  Labs (5/18): K 3.8, creatinine 1.12.  Labs (12/30/2017): K 3.8 Creatinine 1.25  Labs (01/28/2018): K 3.8 Creatinine 1.08 Labs 03/19/2018: K 4.1 Creatinine 1/15 BNP 212  Labs 04/29/3018: Creatinine 1.1  Labs (10/19): LDL 166, K 3.8, creatinine 1.01, AST 102 => 25, ALT 125 => 37, alkaline phosphatase 233  PMH: 1. Nonischemic cardiomyopathy: Prior cath with no significant CAD.  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  Prior echoes with EF as low as 25%.  Echo (9/13) with EF 35-40%, moderate to severe LV dilation, diffuse hypokinesis, mild MR. Echo (5/15) with EF 30-35%, moderate to severe LAE, normal RV size and systolic function.  Angioedema with ACEI, headaches with hydralazine/nitrates. Echo (3/16) with EF 25-30%, severe LV dilation, normal RV size and systolic function.  R/LHC  08/13/15 showed no significant coronary disease; RA mean 6, PA 33/11 mean 23, PCWP mean 13, Fick CO/CI 4.75 /1.68 (difficult study, radial artery spasm, if needs future cath would use groin).  Echo (9/16) showed EF 20-25%.   - Echo (3/18): EF 25-30%, moderate LAE - Echo 4/18 EF 15% - Echo 7/19 EF 20-25% 2. HTN: angioedema with ACEI.  3. OSA: on Bipap 4. Morbid obesity 5. Paroxysmal atrial fibrillation: Not documented recently.   6. Smoker.  7. Anxiety/panic attacks 8. PE: 5/15,  diagnosed by V/Q scan. CTA chest 8/16 negative for PE.  9. NSVT, PVCs: 30 day monitor (12/16) with PVCs, PACs, no atrial fibrillation.  - Zio patch (9/19): few short NSVT runs, no atrial fibrillation, 1.1% PVCs 10. Hematuria: Apparently had negative workup by urology.  11. ABIs (6/16) were normal 12. 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  13. Gout 14. Low back pain.   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  Has son and daughter.    FH: No premature CAD.    Review of systems complete and found to be negative unless listed in HPI.    Current Outpatient Medications  Medication Sig Dispense Refill  . aspirin 81 MG tablet Take 81 mg by mouth daily.    . blood glucose meter kit and supplies KIT Use to check blood sugar. DX E11.9 1 each 0  . clonazePAM (KLONOPIN) 1 MG tablet TAKE 1 TABLET BY MOUTH 3 TIMES DAILY AS NEEDED FOR ANXIETY 90 tablet 3  . magnesium oxide (MAG-OX) 400 MG tablet Take 400 mg by mouth daily as needed (cramping).    . metoprolol succinate (TOPROL-XL) 100 MG 24 hr tablet Take 1 tab in the morning and take 0.5 tab in the evening 45 tablet 3  . NON FORMULARY Bipap, pressure 16/12 with 2L of O2    . pantoprazole (PROTONIX) 40 MG tablet TAKE 1 TABLET (40 MG TOTAL) BY MOUTH DAILY. 90 tablet 3  . potassium chloride SA (K-DUR,KLOR-CON) 20 MEQ tablet Take 20 mEq by mouth daily as needed (cramping).    . sildenafil (REVATIO) 20 MG tablet Take 20 mg by mouth daily as needed.  3  . spironolactone (ALDACTONE) 25 MG tablet TAKE 1 TABLET BY MOUTH EVERY DAY 90 tablet 3  . thiamine (VITAMIN B-1) 100 MG tablet Take 1 tablet (100 mg total) by mouth daily. 90 tablet 1  . torsemide (DEMADEX) 20 MG tablet Take 40 mg by mouth daily. Taking 1 and 1/2 tablet    . traZODone (DESYREL) 50 MG tablet Take 1 tablet (50 mg total) by mouth at bedtime. 90 tablet 1  . atorvastatin (LIPITOR) 40 MG tablet Take 1 tablet (40 mg total) by mouth daily.  (Patient not taking: Reported on 10/18/2018) 90 tablet 1  . losartan (COZAAR) 50 MG tablet Take 1 tablet (50 mg total) by mouth daily. 90 tablet 3   No current facility-administered medications for this encounter.     Allergies  Allergen Reactions  . Ace Inhibitors Anaphylaxis and Swelling  . Buspirone Other (See Comments)    dizziness      Social History   Socioeconomic History  . Marital status: Married    Spouse name: Not on file  . Number of children: 3  . Years of education: Not on file  . Highest education level: Not on file  Occupational History  . Occupation: DISABLED  Social Needs  . Financial resource strain: Not on  file  . Food insecurity:    Worry: Not on file    Inability: Not on file  . Transportation needs:    Medical: Not on file    Non-medical: Not on file  Tobacco Use  . Smoking status: Current Every Day Smoker    Packs/day: 0.50    Years: 30.00    Pack years: 15.00    Types: Cigarettes  . Smokeless tobacco: Never Used  . Tobacco comment: Took information today to call for help with support to quit   Substance and Sexual Activity  . Alcohol use: Yes    Alcohol/week: 0.0 standard drinks    Comment: Reports none in over a month, trying to quit   . Drug use: No  . Sexual activity: Not Currently  Lifestyle  . Physical activity:    Days per week: Not on file    Minutes per session: Not on file  . Stress: Not on file  Relationships  . Social connections:    Talks on phone: Not on file    Gets together: Not on file    Attends religious service: Not on file    Active member of club or organization: Not on file    Attends meetings of clubs or organizations: Not on file    Relationship status: Not on file  . Intimate partner violence:    Fear of current or ex partner: Not on file    Emotionally abused: Not on file    Physically abused: Not on file    Forced sexual activity: Not on file  Other Topics Concern  . Not on file  Social History  Narrative   He smokes about a pack per day and he has been smoking since he was 52 years of age.  He drinks alcohol occasionally, but he denies any illicit drug abuse.  He is presently on disability.    Lives with wife in a 2 story home.  Has 2 children.   Previously worked in [employer], last worked in 1998.   Highest level of education:  11th grade              Family History  Problem Relation Age of Onset  . Cancer Mother        brain tumor  . Hypertension Mother   . Diabetes Father        Deceased, 84  . Heart disease Maternal Grandmother   . Hypertension Unknown        Family History  . Stroke Unknown        Family History  . Diabetes Unknown        Family History  . Diabetes Daughter     Vitals:   10/18/18 1142  BP: 120/90  Pulse: 66  SpO2: 99%  Weight: (!) 182.5 kg (402 lb 6.4 oz)   Filed Weights   10/18/18 1142  Weight: (!) 182.5 kg (402 lb 6.4 oz)   Wt Readings from Last 3 Encounters:  10/18/18 (!) 182.5 kg (402 lb 6.4 oz)  09/29/18 (!) 180.1 kg (397 lb)  09/22/18 (!) 181.4 kg (400 lb)    PHYSICAL EXAM: General: NAD, obese Neck: No JVD, no thyromegaly or thyroid nodule.  Lungs: Clear to auscultation bilaterally with normal respiratory effort. CV: Nondisplaced PMI.  Heart regular S1/S2, no S3/S4, no murmur.  Trace ankle edema.  No carotid bruit.  Normal pedal pulses.  Abdomen: Soft, nontender, no hepatosplenomegaly, no distention.  Skin: Intact without lesions or rashes.  Neurologic: Alert  and oriented x 3.  Psych: Normal affect. Extremities: No clubbing or cyanosis.  HEENT: Normal.   ASSESSMENT & PLAN: 1. Chronic systolic CHF: Nonischemic cardiomyopathy. Echo (3/18) with EF 25-30%. He was seen by EP and decided against ICD given marked obesity.  QRS not wide enough for CRT. Echo 05/2018 with EF 20-25%. NYHA class II-III symptoms. He does not look volume overloaded on exam.  - Continue torsemide 30 mg daily. BMET today.  - Continue Spiro 25 mg daily -  Continue Toprol XL 100 qam/50 qpm.  - Restart losartan 50 mg daily.  He stopped it as he was concerned it was affecting his liver function (not likely).  - No entresto with h/o angioedema.  - Had side effects from digoxin, does not want to take. - Headaches with Bidil, cannot take.  2. Obesity: Continue to work on weight loss.  3. Smoking: Smoking 1 ppd. Encouraged cessation. No change. 4. OSA: Continue nightly BiPAP. 5. Paroxysmal atrial fibrillation: None documented recently.  He has refused anticoagulation.  Would re-address if atrial fibrillation recurs.  6. PE: 04/14/2018, diagnosed by V/Q scan.  He was on Xarelto for about a year but stopped in the setting of hematuria.  7. Anxiety/Panic attacks: Stopped Zoloft as afraid it was affecting his LFTs.  - Followup with PCP. 8. Elevated LFTs: uncertain etiology.  LFTs done today were completely normal.  - He has followup with GI.  - Would recommend restarting on atorvastatin.   [name] 10/19/2018

## 2018-10-19 NOTE — Telephone Encounter (Signed)
-----   Message from [name], MD sent at 10/19/2018  1:43 AM EST ----- Please have Mr Kosik restart his atorvastatin.  His LFTs are completely back to normal.

## 2018-10-31 DIAGNOSIS — I509 Heart failure, unspecified: Secondary | ICD-10-CM | POA: Diagnosis not present

## 2018-10-31 DIAGNOSIS — G4733 Obstructive sleep apnea (adult) (pediatric): Secondary | ICD-10-CM | POA: Diagnosis not present

## 2018-10-31 DIAGNOSIS — I10 Essential (primary) hypertension: Secondary | ICD-10-CM | POA: Diagnosis not present

## 2018-11-02 ENCOUNTER — Telehealth: Payer: Self-pay | Admitting: Gastroenterology

## 2018-11-02 ENCOUNTER — Ambulatory Visit: Payer: Self-pay | Admitting: Gastroenterology

## 2018-11-02 NOTE — Telephone Encounter (Signed)
Cancelled his office appt today

## 2018-11-20 ENCOUNTER — Observation Stay (HOSPITAL_COMMUNITY)
Admission: EM | Admit: 2018-11-20 | Discharge: 2018-11-23 | Disposition: A | Discharge status: Home / Self Care | Payer: Medicare HMO | Attending: Internal Medicine | Admitting: Internal Medicine

## 2018-11-20 ENCOUNTER — Emergency Department (HOSPITAL_COMMUNITY): Payer: Medicare HMO

## 2018-11-20 ENCOUNTER — Encounter (HOSPITAL_COMMUNITY): Payer: Self-pay

## 2018-11-20 DIAGNOSIS — I110 Hypertensive heart disease with heart failure: Secondary | ICD-10-CM | POA: Diagnosis not present

## 2018-11-20 DIAGNOSIS — I480 Paroxysmal atrial fibrillation: Secondary | ICD-10-CM | POA: Diagnosis not present

## 2018-11-20 DIAGNOSIS — G4733 Obstructive sleep apnea (adult) (pediatric): Secondary | ICD-10-CM | POA: Diagnosis not present

## 2018-11-20 DIAGNOSIS — I10 Essential (primary) hypertension: Secondary | ICD-10-CM | POA: Diagnosis not present

## 2018-11-20 DIAGNOSIS — R52 Pain, unspecified: Secondary | ICD-10-CM | POA: Diagnosis not present

## 2018-11-20 DIAGNOSIS — E119 Type 2 diabetes mellitus without complications: Secondary | ICD-10-CM | POA: Insufficient documentation

## 2018-11-20 DIAGNOSIS — I517 Cardiomegaly: Secondary | ICD-10-CM | POA: Diagnosis not present

## 2018-11-20 DIAGNOSIS — R600 Localized edema: Secondary | ICD-10-CM | POA: Diagnosis not present

## 2018-11-20 DIAGNOSIS — R079 Chest pain, unspecified: Secondary | ICD-10-CM | POA: Diagnosis not present

## 2018-11-20 DIAGNOSIS — E118 Type 2 diabetes mellitus with unspecified complications: Secondary | ICD-10-CM | POA: Diagnosis present

## 2018-11-20 DIAGNOSIS — L0390 Cellulitis, unspecified: Secondary | ICD-10-CM | POA: Diagnosis present

## 2018-11-20 DIAGNOSIS — L03115 Cellulitis of right lower limb: Secondary | ICD-10-CM | POA: Diagnosis not present

## 2018-11-20 DIAGNOSIS — M25475 Effusion, left foot: Secondary | ICD-10-CM | POA: Diagnosis not present

## 2018-11-20 DIAGNOSIS — I5042 Chronic combined systolic (congestive) and diastolic (congestive) heart failure: Secondary | ICD-10-CM | POA: Diagnosis not present

## 2018-11-20 DIAGNOSIS — M109 Gout, unspecified: Secondary | ICD-10-CM | POA: Insufficient documentation

## 2018-11-20 HISTORY — DX: Gout, unspecified: M10.9

## 2018-11-20 LAB — COMPREHENSIVE METABOLIC PANEL
ALK PHOS: 108 U/L (ref 38–126)
ALT: 26 U/L (ref 0–44)
AST: 18 U/L (ref 15–41)
Albumin: 3.5 g/dL (ref 3.5–5.0)
Anion gap: 11 (ref 5–15)
BUN: 10 mg/dL (ref 6–20)
CO2: 24 mmol/L (ref 22–32)
Calcium: 9.1 mg/dL (ref 8.9–10.3)
Chloride: 104 mmol/L (ref 98–111)
Creatinine, Ser: 1.2 mg/dL (ref 0.61–1.24)
GFR calc Af Amer: >60 mL/min (ref >60)
GFR calc non Af Amer: >60 mL/min (ref >60)
Glucose, Bld: 120 mg/dL — ABNORMAL HIGH (ref 70–99)
Potassium: 4.2 mmol/L (ref 3.5–5.1)
Sodium: 139 mmol/L (ref 135–145)
Total Bilirubin: 0.7 mg/dL (ref 0.3–1.2)
Total Protein: 8.4 g/dL — ABNORMAL HIGH (ref 6.5–8.1)

## 2018-11-20 LAB — I-STAT CG4 LACTIC ACID, ED: Lactic Acid, Venous: 1.57 mmol/L (ref 0.5–1.9)

## 2018-11-20 LAB — SEDIMENTATION RATE: Sed Rate: 22 mm/hr — ABNORMAL HIGH (ref 0–16)

## 2018-11-20 LAB — CBC
HCT: 45.6 % (ref 39.0–52.0)
HEMOGLOBIN: 15.9 g/dL (ref 13.0–17.0)
MCH: 30 pg (ref 26.0–34.0)
MCHC: 34.9 g/dL (ref 30.0–36.0)
MCV: 86 fL (ref 80.0–100.0)
Platelets: 280 10*3/uL (ref 150–400)
RBC: 5.3 MIL/uL (ref 4.22–5.81)
RDW: 13.1 % (ref 11.5–15.5)
WBC: 12.5 10*3/uL — ABNORMAL HIGH (ref 4.0–10.5)
nRBC: 0 % (ref 0.0–0.2)

## 2018-11-20 LAB — C-REACTIVE PROTEIN: CRP: 5.9 mg/dL — ABNORMAL HIGH (ref <1.0)

## 2018-11-20 LAB — I-STAT TROPONIN, ED: Troponin i, poc: 0.07 ng/mL (ref 0.00–0.08)

## 2018-11-20 LAB — BRAIN NATRIURETIC PEPTIDE: B Natriuretic Peptide: 76 pg/mL (ref 0.0–100.0)

## 2018-11-20 MED ORDER — HYDROMORPHONE HCL 1 MG/ML IJ SOLN
1.0000 mg | Freq: Once | INTRAMUSCULAR | Status: AC
  Administered 2018-11-20: 1 mg via INTRAVENOUS
  Filled 2018-11-20: qty 1

## 2018-11-20 MED ORDER — HYDROMORPHONE HCL 1 MG/ML IJ SOLN
1.0000 mg | Freq: Once | INTRAMUSCULAR | Status: AC
  Administered 2018-11-21: 1 mg via INTRAVENOUS
  Filled 2018-11-20: qty 1

## 2018-11-20 MED ORDER — SODIUM CHLORIDE 0.9 % IV SOLN
2.0000 g | Freq: Once | INTRAVENOUS | Status: AC
  Administered 2018-11-21: 2 g via INTRAVENOUS
  Filled 2018-11-20: qty 20

## 2018-11-20 MED ORDER — VANCOMYCIN HCL 10 G IV SOLR
2000.0000 mg | Freq: Once | INTRAVENOUS | Status: AC
  Administered 2018-11-21: 2000 mg via INTRAVENOUS
  Filled 2018-11-20: qty 2000

## 2018-11-20 MED ORDER — SODIUM CHLORIDE 0.9 % IV BOLUS: 500.0000 mL | Freq: Once | INTRAVENOUS | Status: DC

## 2018-11-20 MED ORDER — IOHEXOL 300 MG/ML  SOLN: 100.0000 mL | Freq: Once | INTRAMUSCULAR | Status: DC | PRN

## 2018-11-20 NOTE — ED Notes (Signed)
Patient transported to CT 

## 2018-11-20 NOTE — ED Provider Notes (Signed)
[facility] Provider Note MRN:  [id number]  Arrival date & time: 11/21/18     Chief Complaint   Gout   History of Present Illness   [name] is a 52 y.o. year-old male with a history of CHF, diabetes, pulmonary balloon presenting to the ED with chief complaint of gout.  Patient has been having significant pain of the left great toe for 2 days, has been improving today.  This morning, began having severe right foot pain, which has progressed and is sending up the leg.  Denies trauma.  Feels feverish today.  Endorsing shortness of breath due to the pain today.  Denies chest pain, no abdominal pain.  Pain is worse with movement or palpation, constant, 10 out of 10 in severity.  Review of Systems  A complete 10 system review of systems was obtained and all systems are negative except as noted in the HPI and PMH.   Patient's Health History    Past Medical History:  Diagnosis Date  . Alcohol abuse   . Anxiety state, unspecified   . Atrial fibrillation ([place])   . CHF (congestive heart failure) ([place])   . Chronic systolic heart failure ([place])   . Diabetes mellitus, type II ([place])   . Edema   . Gout   . History of medication noncompliance   . Migraine   . Obesity, unspecified   . Obstructive sleep apnea   . Psychiatric disorder   . Pulmonary embolism ([place])   . Shortness of breath     Past Surgical History:  Procedure Laterality Date  . CARDIAC CATHETERIZATION    . CARDIAC CATHETERIZATION N/A 08/13/2015   Procedure: Right/Left Heart Cath and Coronary Angiography;  Surgeon: [name], MD;  Location: [place] [facility];  Service: Cardiovascular;  Laterality: N/A;  . RIGHT/LEFT HEART CATH AND CORONARY ANGIOGRAPHY N/A 04/14/2017   Procedure: Right/Left Heart Cath and Coronary Angiography;  Surgeon: [name], MD;  Location: [place] [facility];  Service: Cardiovascular;  Laterality: N/A;  . TESTICLE SURGERY      Family History    Problem Relation Age of Onset  . Cancer Mother        brain tumor  . Hypertension Mother   . Diabetes Father        Deceased, 40  . Heart disease Maternal Grandmother   . Hypertension Other        Family History  . Stroke Other        Family History  . Diabetes Other        Family History  . Diabetes Daughter     Social History   Socioeconomic History  . Marital status: Married    Spouse name: Not on file  . Number of children: 3  . Years of education: Not on file  . Highest education level: Not on file  Occupational History  . Occupation: DISABLED  Social Needs  . Financial resource strain: Not on file  . Food insecurity:    Worry: Not on file    Inability: Not on file  . Transportation needs:    Medical: Not on file    Non-medical: Not on file  Tobacco Use  . Smoking status: Current Every Day Smoker    Packs/day: 0.50    Years: 30.00    Pack years: 15.00    Types: Cigarettes  . Smokeless tobacco: Never Used  . Tobacco comment: Took information today to call for help with  support to quit   Substance and Sexual Activity  . Alcohol use: Not Currently    Alcohol/week: 0.0 standard drinks    Comment: Reports none in over a month, trying to quit   . Drug use: No  . Sexual activity: Not Currently  Lifestyle  . Physical activity:    Days per week: Not on file    Minutes per session: Not on file  . Stress: Not on file  Relationships  . Social connections:    Talks on phone: Not on file    Gets together: Not on file    Attends religious service: Not on file    Active member of club or organization: Not on file    Attends meetings of clubs or organizations: Not on file    Relationship status: Not on file  . Intimate partner violence:    Fear of current or ex partner: Not on file    Emotionally abused: Not on file    Physically abused: Not on file    Forced sexual activity: Not on file  Other Topics Concern  . Not on file  Social History Narrative   He  smokes about a pack per day and he has been smoking since he was 52 years of age.  He drinks alcohol occasionally, but he denies any illicit drug abuse.  He is presently on disability.    Lives with wife in a 2 story home.  Has 2 children.   Previously worked in [employer], last worked in 1998.   Highest level of education:  11th grade             Physical Exam  Vital Signs and Nursing Notes reviewed Vitals:   11/20/18 2200 11/20/18 2230  BP: (!) 141/88 129/88  Pulse: 82 79  Resp: 18 17  Temp:    SpO2: 96% 96%    CONSTITUTIONAL: Well-appearing, NAD NEURO:  Alert and oriented x 3, no focal deficits EYES:  eyes equal and reactive ENT/NECK:  no LAD, no JVD CARDIO: Regular rate, well-perfused, normal S1 and S2 PULM:  CTAB no wheezing or rhonchi GI/GU:  normal bowel sounds, non-distended, non-tender MSK/SPINE:  No gross deformities, moderate tenderness to palpation to the left first MTP joint; diffuse erythema, edema, tenderness palpation to the right foot and right distal tibia/fibula, pain out of proportion SKIN:  no rash, atraumatic PSYCH:  Appropriate speech and behavior  Diagnostic and Interventional Summary    EKG Interpretation  Date/Time:  Saturday November 20 2018 20:31:31 EST Ventricular Rate:  89 PR Interval:    QRS Duration: 123 QT Interval:  368 QTC Calculation: 448 R Axis:   74 Text Interpretation:  Sinus rhythm Nonspecific intraventricular conduction delay Borderline repolarization abnormality ST elevation, consider anterior injury Confirmed by Gerlene Fee [phone]) on 11/20/2018 11:49:25 PM      Labs Reviewed  CBC - Abnormal; Notable for the following components:      Result Value   WBC 12.5 (*)    All other components within normal limits  COMPREHENSIVE METABOLIC PANEL - Abnormal; Notable for the following components:   Glucose, Bld 120 (*)    Total Protein 8.4 (*)    All other components within normal limits  C-REACTIVE PROTEIN - Abnormal; Notable for  the following components:   CRP 5.9 (*)    All other components within normal limits  SEDIMENTATION RATE - Abnormal; Notable for the following components:   Sed Rate 22 (*)    All other components within normal  limits  CULTURE, BLOOD (ROUTINE X 2)  CULTURE, BLOOD (ROUTINE X 2)  BRAIN NATRIURETIC PEPTIDE  I-STAT TROPONIN, ED  I-STAT CG4 LACTIC ACID, ED    CT TIBIA FIBULA RIGHT WO CONTRAST  Final Result    DG Chest Port 1 View  Final Result    CT FOOT RIGHT WO CONTRAST    (Results Pending)    Medications  iohexol (OMNIPAQUE) 300 MG/ML solution 100 mL (100 mLs Intravenous Canceled Entry 11/20/18 2239)  vancomycin (VANCOCIN) 2,000 mg in sodium chloride 0.9 % 500 mL IVPB (has no administration in time range)  cefTRIAXone (ROCEPHIN) 2 g in sodium chloride 0.9 % 100 mL IVPB (has no administration in time range)  HYDROmorphone (DILAUDID) injection 1 mg (has no administration in time range)  HYDROmorphone (DILAUDID) injection 1 mg (1 mg Intravenous Given 11/20/18 2152)     Procedures Critical Care  ED Course and Medical Decision Making  I have reviewed the triage vital signs and the nursing notes.  Pertinent labs & imaging results that were available during my care of the patient were reviewed by me and considered in my medical decision making (see below for details).  Concern for necrotizing infection in this 52 year old male with history of diabetes here with severe right foot pain, erythema, edema, subjective fevers.  Also considering gout, cellulitis, less likely DVT.  CT is pending.  Labs reveal leukocytosis, elevated inflammatory markers.  Patient continues to require an IV opioids for pain control.  CT reveals nonspecific edema of the soft tissues, no evidence of neck rash, no evidence of osteomyelitis, that we were unable to do IV contrasted studies given his kidney function.  Will admit to hospitalist service for cellulitis.  Barth Kirks. Sedonia Small, [place] [email or web address]  Final Clinical Impressions(s) / ED Diagnoses     ICD-10-CM   1. Cellulitis of right foot L03.115   2. Chest pain R07.9 DG Chest [facility] 1 View    DG Chest [facility]    ED Discharge Orders    None         Maudie Flakes, MD 11/21/18 [phone]

## 2018-11-20 NOTE — ED Triage Notes (Signed)
Pt coming by ems due to gout attack to both legs. Pt states it started in the left toe then went to the right toe and slowly went to both feet.

## 2018-11-20 NOTE — ED Notes (Signed)
Pt feet are both swollen and painful to touch.

## 2018-11-21 ENCOUNTER — Other Ambulatory Visit: Payer: Self-pay

## 2018-11-21 DIAGNOSIS — I10 Essential (primary) hypertension: Secondary | ICD-10-CM

## 2018-11-21 DIAGNOSIS — M109 Gout, unspecified: Secondary | ICD-10-CM

## 2018-11-21 DIAGNOSIS — L0390 Cellulitis, unspecified: Secondary | ICD-10-CM | POA: Diagnosis not present

## 2018-11-21 DIAGNOSIS — E118 Type 2 diabetes mellitus with unspecified complications: Secondary | ICD-10-CM

## 2018-11-21 DIAGNOSIS — L03115 Cellulitis of right lower limb: Secondary | ICD-10-CM

## 2018-11-21 LAB — CBC
HCT: 41.9 % (ref 39.0–52.0)
Hemoglobin: 14.4 g/dL (ref 13.0–17.0)
MCH: 28.9 pg (ref 26.0–34.0)
MCHC: 34.4 g/dL (ref 30.0–36.0)
MCV: 84.1 fL (ref 80.0–100.0)
PLATELETS: 244 10*3/uL (ref 150–400)
RBC: 4.98 MIL/uL (ref 4.22–5.81)
RDW: 12.8 % (ref 11.5–15.5)
WBC: 11.1 10*3/uL — ABNORMAL HIGH (ref 4.0–10.5)
nRBC: 0 % (ref 0.0–0.2)

## 2018-11-21 LAB — GLUCOSE, CAPILLARY
Glucose-Capillary: 132 mg/dL — ABNORMAL HIGH (ref 70–99)
Glucose-Capillary: 118 mg/dL — ABNORMAL HIGH (ref 70–99)
Glucose-Capillary: 129 mg/dL — ABNORMAL HIGH (ref 70–99)
Glucose-Capillary: 116 mg/dL — ABNORMAL HIGH (ref 70–99)

## 2018-11-21 LAB — HEMOGLOBIN A1C
Hgb A1c MFr Bld: 6.6 % — ABNORMAL HIGH (ref 4.8–5.6)
Mean Plasma Glucose: 142.72 mg/dL

## 2018-11-21 LAB — URIC ACID
Uric Acid, Serum: 7.4 mg/dL (ref 3.7–8.6)
Uric Acid, Serum: 7.6 mg/dL (ref 3.7–8.6)

## 2018-11-21 MED ORDER — METOPROLOL SUCCINATE ER 100 MG PO TB24
100.0000 mg | ORAL_TABLET | Freq: Every day | ORAL | Status: DC
  Administered 2018-11-21 – 2018-11-23 (×3): 100 mg via ORAL
  Filled 2018-11-21 (×3): qty 1

## 2018-11-21 MED ORDER — TORSEMIDE 20 MG PO TABS
20.0000 mg | ORAL_TABLET | Freq: Once | ORAL | Status: AC
  Administered 2018-11-21: 20 mg via ORAL
  Filled 2018-11-21: qty 1

## 2018-11-21 MED ORDER — ASPIRIN EC 81 MG PO TBEC
81.0000 mg | DELAYED_RELEASE_TABLET | Freq: Every day | ORAL | Status: DC
  Administered 2018-11-21 – 2018-11-22 (×2): 81 mg via ORAL
  Filled 2018-11-21 (×2): qty 1

## 2018-11-21 MED ORDER — OMEGA-3-ACID ETHYL ESTERS 1 G PO CAPS
1000.0000 mg | ORAL_CAPSULE | Freq: Every day | ORAL | Status: DC
  Administered 2018-11-21 – 2018-11-22 (×2): 1000 mg via ORAL
  Filled 2018-11-21 (×2): qty 1

## 2018-11-21 MED ORDER — NAPROXEN 250 MG PO TABS
500.0000 mg | ORAL_TABLET | Freq: Two times a day (BID) | ORAL | Status: DC
  Administered 2018-11-21 (×2): 500 mg via ORAL
  Filled 2018-11-21 (×4): qty 2

## 2018-11-21 MED ORDER — TRAZODONE HCL 50 MG PO TABS: 50.0000 mg | ORAL_TABLET | Freq: Every evening | ORAL | Status: DC | PRN

## 2018-11-21 MED ORDER — ENOXAPARIN SODIUM 40 MG/0.4ML ~~LOC~~ SOLN
40.0000 mg | Freq: Every day | SUBCUTANEOUS | Status: DC
  Administered 2018-11-21 – 2018-11-23 (×3): 40 mg via SUBCUTANEOUS
  Filled 2018-11-21 (×2): qty 0.4

## 2018-11-21 MED ORDER — METOPROLOL SUCCINATE ER 50 MG PO TB24: 50.0000 mg | ORAL_TABLET | ORAL | Status: DC

## 2018-11-21 MED ORDER — HYDROCERIN EX CREA
TOPICAL_CREAM | Freq: Two times a day (BID) | CUTANEOUS | Status: DC
  Administered 2018-11-21 – 2018-11-23 (×5): via TOPICAL
  Filled 2018-11-21: qty 113

## 2018-11-21 MED ORDER — TRAMADOL HCL 50 MG PO TABS
50.0000 mg | ORAL_TABLET | Freq: Four times a day (QID) | ORAL | Status: DC | PRN
  Administered 2018-11-21 – 2018-11-22 (×4): 50 mg via ORAL
  Filled 2018-11-21 (×5): qty 1

## 2018-11-21 MED ORDER — METOPROLOL SUCCINATE ER 50 MG PO TB24
50.0000 mg | ORAL_TABLET | Freq: Every day | ORAL | Status: DC
  Administered 2018-11-21 – 2018-11-22 (×2): 50 mg via ORAL
  Filled 2018-11-21 (×2): qty 1

## 2018-11-21 MED ORDER — CEFAZOLIN SODIUM-DEXTROSE 2-4 GM/100ML-% IV SOLN
2.0000 g | Freq: Three times a day (TID) | INTRAVENOUS | Status: DC
  Administered 2018-11-21 – 2018-11-22 (×5): 2 g via INTRAVENOUS
  Filled 2018-11-21 (×8): qty 100

## 2018-11-21 MED ORDER — CLONAZEPAM 1 MG PO TABS
1.0000 mg | ORAL_TABLET | Freq: Three times a day (TID) | ORAL | Status: DC | PRN
  Administered 2018-11-21 – 2018-11-22 (×6): 1 mg via ORAL
  Filled 2018-11-21 (×6): qty 1

## 2018-11-21 MED ORDER — PANTOPRAZOLE SODIUM 40 MG PO TBEC
40.0000 mg | DELAYED_RELEASE_TABLET | Freq: Every day | ORAL | Status: DC
  Administered 2018-11-21 – 2018-11-22 (×2): 40 mg via ORAL
  Filled 2018-11-21 (×2): qty 1

## 2018-11-21 MED ORDER — MAGNESIUM OXIDE 400 (241.3 MG) MG PO TABS: 400.0000 mg | ORAL_TABLET | Freq: Every day | ORAL | Status: DC | PRN

## 2018-11-21 MED ORDER — INSULIN ASPART 100 UNIT/ML ~~LOC~~ SOLN
0.0000 [IU] | Freq: Three times a day (TID) | SUBCUTANEOUS | Status: DC
  Administered 2018-11-21 – 2018-11-22 (×2): 1 [IU] via SUBCUTANEOUS
  Administered 2018-11-22: 2 [IU] via SUBCUTANEOUS
  Administered 2018-11-23: 5 [IU] via SUBCUTANEOUS

## 2018-11-21 MED ORDER — KETOROLAC TROMETHAMINE 30 MG/ML IJ SOLN
30.0000 mg | Freq: Four times a day (QID) | INTRAMUSCULAR | Status: DC | PRN
  Administered 2018-11-21: 30 mg via INTRAVENOUS
  Filled 2018-11-21: qty 1

## 2018-11-21 MED ORDER — B COMPLEX-C PO TABS
1.0000 | ORAL_TABLET | Freq: Every day | ORAL | Status: DC
  Administered 2018-11-21 – 2018-11-22 (×2): 1 via ORAL
  Filled 2018-11-21 (×3): qty 1

## 2018-11-21 NOTE — Plan of Care (Signed)
  Problem: Health Behavior/Discharge Planning: Goal: Ability to manage health-related needs will improve Outcome: Progressing   Problem: Activity: Goal: Risk for activity intolerance will decrease Outcome: Progressing   

## 2018-11-21 NOTE — H&P (Signed)
History and Physical    [name]:[id number] DOB: 1966/05/03 DOA: 11/20/2018  PCP: [name], MD Patient coming from: Home  Chief Complaint: Foot pain  HPI: [name] is a 52 y.o. male with medical history significant of A. fib, CHF, type 2 diabetes, gout, OSA presenting to the hospital for evaluation of foot pain.  Patient reports having swelling, pain, and burning of the big toe of his left foot for the past 2 months.  He believes symptoms have been stable.  States his doctor previously gave him a course of steroids which did not help.  He was advised to stop using allopurinol a few months ago because of problems with his liver function.  States he does not drink alcohol and does not eat much red meat.  Patient is primarily complaining of pain in his right foot at present.  States today he noticed that his right big toe became painful and swollen.  Pain and swelling then spread to the top of his foot and finally to his ankle.  Denies any skin tear or injury to this foot.  Reports having chills today.  Review of Systems: As per HPI otherwise 10 point review of systems negative.  Past Medical History:  Diagnosis Date  . Alcohol abuse   . Anxiety state, unspecified   . Atrial fibrillation ([place])   . CHF (congestive heart failure) ([place])   . Chronic systolic heart failure ([place])   . Diabetes mellitus, type II ([place])   . Edema   . Gout   . History of medication noncompliance   . Migraine   . Obesity, unspecified   . Obstructive sleep apnea   . Psychiatric disorder   . Pulmonary embolism ([place])   . Shortness of breath     Past Surgical History:  Procedure Laterality Date  . CARDIAC CATHETERIZATION    . CARDIAC CATHETERIZATION N/A 08/13/2015   Procedure: Right/Left Heart Cath and Coronary Angiography;  Surgeon: [name], MD;  Location: [place] [facility];  Service: Cardiovascular;  Laterality: N/A;  . RIGHT/LEFT HEART CATH AND CORONARY ANGIOGRAPHY N/A 04/14/2017     Procedure: Right/Left Heart Cath and Coronary Angiography;  Surgeon: [name], MD;  Location: [place] [facility];  Service: Cardiovascular;  Laterality: N/A;  . TESTICLE SURGERY       reports that he has been smoking cigarettes. He has a 15.00 pack-year smoking history. He has never used smokeless tobacco. He reports previous alcohol use. He reports that he does not use drugs.  Allergies  Allergen Reactions  . Ace Inhibitors Anaphylaxis and Swelling  . Buspirone Other (See Comments)    dizziness    Family History  Problem Relation Age of Onset  . Cancer Mother        brain tumor  . Hypertension Mother   . Diabetes Father        Deceased, 70  . Heart disease Maternal Grandmother   . Hypertension Other        Family History  . Stroke Other        Family History  . Diabetes Other        Family History  . Diabetes Daughter     Prior to Admission medications   Medication Sig Start Date End Date Taking? Authorizing Provider  aspirin EC 81 MG tablet Take 81 mg by mouth daily at 12 noon.   Yes [provider]  b complex vitamins tablet Take 1 tablet by mouth  daily at 12 noon.   Yes [provider]  clonazePAM (KLONOPIN) 1 MG tablet TAKE 1 TABLET BY MOUTH 3 TIMES DAILY AS NEEDED FOR ANXIETY Patient taking differently: Take 1 mg by mouth 3 (three) times daily.  09/29/18  Yes [name], MD  losartan (COZAAR) 50 MG tablet Take 1 tablet (50 mg total) by mouth daily. Patient taking differently: Take 50 mg by mouth daily at 12 noon.  10/18/18 01/16/19 Yes [name], MD  magnesium oxide (MAG-OX) 400 MG tablet Take 400 mg by mouth daily as needed (leg cramps).    Yes [provider]  metoprolol succinate (TOPROL-XL) 100 MG 24 hr tablet Take 1 tab in the morning and take 0.5 tab in the evening Patient taking differently: Take 50-100 mg by mouth See admin instructions. Take one tablet (100 mg) by mouth daily at noon and 1/2 tablet (50 mg) at  night 10/18/18  Yes [name], MD  Misc Natural Products [facility] ADVANCED) CAPS Take 2 capsules by mouth daily at 12 noon. 1 capsule - 1200 mg   Yes [provider]  Omega-3 Fatty Acids (FISH OIL) 1000 MG CAPS Take 1,000 mg by mouth daily at 12 noon.   Yes [provider]  pantoprazole (PROTONIX) 40 MG tablet TAKE 1 TABLET (40 MG TOTAL) BY MOUTH DAILY. Patient taking differently: Take 40 mg by mouth daily at 12 noon.  12/30/17  Yes Clegg, Amy D, NP  potassium chloride SA (K-DUR,KLOR-CON) 20 MEQ tablet Take 20 mEq by mouth daily as needed (cramping).   Yes [provider]  PRESCRIPTION MEDICATION Inhale into the lungs See admin instructions. Bipap, pressure 16/12 with 2L of O2 - use whenever sleeping   Yes [provider]  sildenafil (REVATIO) 20 MG tablet Take 20 mg by mouth daily as needed (erectile dysfunction).  07/21/18  Yes [provider]  spironolactone (ALDACTONE) 25 MG tablet TAKE 1 TABLET BY MOUTH EVERY DAY Patient taking differently: Take 25 mg by mouth daily at 12 noon.  09/14/18  Yes [name], MD  torsemide (DEMADEX) 20 MG tablet Take 20-40 mg by mouth daily at 12 noon.    Yes [provider]  traZODone (DESYREL) 50 MG tablet Take 1 tablet (50 mg total) by mouth at bedtime. Patient taking differently: Take 50 mg by mouth at bedtime as needed for sleep.  09/29/18  Yes [name], MD  atorvastatin (LIPITOR) 40 MG tablet Take 1 tablet (40 mg total) by mouth daily. Patient not taking: Reported on 10/18/2018 09/22/18   [name], MD  blood glucose meter kit and supplies KIT Use to check blood sugar. DX E11.9 04/30/18   [name], FNP  thiamine (VITAMIN B-1) 100 MG tablet Take 1 tablet (100 mg total) by mouth daily. Patient not taking: Reported on 11/20/2018 04/29/18   [name], MD  pravastatin (PRAVACHOL) 40 MG tablet Take 40 mg by mouth daily.  02/20/12  [provider]     Physical Exam: Vitals:   11/20/18 2130 11/20/18 2200 11/20/18 2230 11/21/18 0210  BP: 126/81 (!) 141/88 129/88 125/83  Pulse: 81 82 79 78  Resp: 15 18 17 18   Temp:    99.8 F (37.7 C)  TempSrc:    Oral  SpO2: 98% 96% 96% 97%  Weight:    (!) 179.5 kg  Height:    5' 8"  (1.727 m)    Physical Exam  Constitutional: He is oriented to  person, place, and time. He appears well-developed and well-nourished. No distress.  HENT:  Head: Normocephalic.  Mouth/Throat: Oropharynx is clear and moist.  Eyes: Right eye exhibits no discharge. Left eye exhibits no discharge.  Neck: Neck supple.  Cardiovascular: Normal rate, regular rhythm and intact distal pulses.  Pulmonary/Chest: Effort normal and breath sounds normal. No respiratory distress. He has no wheezes. He has no rales.  Abdominal: Soft. Bowel sounds are normal. He exhibits no distension. There is no abdominal tenderness.  Musculoskeletal:        General: No edema.     Comments: Left foot: First MTP joint appears swollen.  No erythema or increased warmth. Right foot: Edema of first toe and the entire dorsum of the foot.  Skin appears mildly erythematous.  Neurological: He is alert and oriented to person, place, and time.  Skin: Skin is warm and dry. He is not diaphoretic.     Labs on Admission: I have personally reviewed following labs and imaging studies  CBC: Recent Labs  Lab 11/20/18 2131  WBC 12.5*  HGB 15.9  HCT 45.6  MCV 86.0  PLT 976   Basic Metabolic Panel: Recent Labs  Lab 11/20/18 2131  NA 139  K 4.2  CL 104  CO2 24  GLUCOSE 120*  BUN 10  CREATININE 1.20  CALCIUM 9.1   GFR: Estimated Creatinine Clearance: 117.5 mL/min (by C-G formula based on SCr of 1.2 mg/dL). Liver Function Tests: Recent Labs  Lab 11/20/18 2131  AST 18  ALT 26  ALKPHOS 108  BILITOT 0.7  PROT 8.4*  ALBUMIN 3.5   No results for input(s): LIPASE, AMYLASE in the last 168 hours. No results for input(s): AMMONIA in the last  168 hours. Coagulation Profile: No results for input(s): INR, PROTIME in the last 168 hours. Cardiac Enzymes: No results for input(s): CKTOTAL, CKMB, CKMBINDEX, TROPONINI in the last 168 hours. BNP (last 3 results) No results for input(s): PROBNP in the last 8760 hours. HbA1C: No results for input(s): HGBA1C in the last 72 hours. CBG: No results for input(s): GLUCAP in the last 168 hours. Lipid Profile: No results for input(s): CHOL, HDL, LDLCALC, TRIG, CHOLHDL, LDLDIRECT in the last 72 hours. Thyroid Function Tests: No results for input(s): TSH, T4TOTAL, FREET4, T3FREE, THYROIDAB in the last 72 hours. Anemia Panel: No results for input(s): VITAMINB12, FOLATE, FERRITIN, TIBC, IRON, RETICCTPCT in the last 72 hours. Urine analysis:    Component Value Date/Time   COLORURINE AMBER (A) 04/22/2018 0345   APPEARANCEUR HAZY (A) 04/22/2018 0345   LABSPEC 1.015 04/22/2018 0345   PHURINE 5.0 04/22/2018 0345   GLUCOSEU NEGATIVE 04/22/2018 0345   GLUCOSEU NEGATIVE 04/21/2018 1150   HGBUR NEGATIVE 04/22/2018 0345   BILIRUBINUR MODERATE (A) 04/22/2018 0345   KETONESUR NEGATIVE 04/22/2018 0345   PROTEINUR NEGATIVE 04/22/2018 0345   UROBILINOGEN 1.0 04/21/2018 1150   NITRITE NEGATIVE 04/22/2018 0345   LEUKOCYTESUR SMALL (A) 04/22/2018 0345    Radiological Exams on Admission: Ct Tibia Fibula Right Wo Contrast  Result Date: 11/21/2018 CLINICAL DATA:  Lower extremity pain EXAM: CT OF THE LOWER RIGHT EXTREMITY WITHOUT CONTRAST TECHNIQUE: Multidetector CT imaging of the right lower extremity was performed according to the standard protocol. COMPARISON:  None. FINDINGS: Bones/Joint/Cartilage No acute fracture or malalignment. No periostitis or bone destruction. Circumscribed lucent lesion in the talus, possibly a cyst. Deformity of the posterior malleolus of the distal tibia, suspect for old fracture deformity. Distal interosseous membrane calcification. Shallow trochlear groove at the femur with  mild lateral deviation of the patella. Ligaments Suboptimally assessed by CT. Muscles and Tendons No significant muscular atrophy. No focal fluid collections. Medial and lateral ankle tendons grossly normal in position. Soft tissues Mild nonspecific subcutaneous edema. No soft tissue emphysema. Negative for radiopaque foreign body. IMPRESSION: 1. No acute osseous abnormality. Negative for bone destruction or periostitis. Negative for soft tissue emphysema or focal fluid collection allowing for absence of contrast 2. Nonspecific mild edema within the subcutaneous soft tissues of the distal lower leg 3. Probable old fracture deformity of the posterior malleolus of the tibia. Electronically Signed   By: Donavan Foil M.D.   On: 11/21/2018 00:01   Ct Foot Right Wo Contrast  Result Date: 11/21/2018 CLINICAL DATA:  Acute onset of right foot pain. Assess for osteomyelitis or necrotizing fasciitis. EXAM: CT OF THE RIGHT FOOT WITHOUT CONTRAST TECHNIQUE: Multidetector CT imaging of the right foot was performed according to the standard protocol. Multiplanar CT image reconstructions were also generated. COMPARISON:  Right great toe radiographs performed 02/12/2016 FINDINGS: Bones/Joint/Cartilage There is no evidence of fracture or dislocation. No definite osseous erosions are seen to suggest osteomyelitis, though evaluation for osteomyelitis is somewhat suboptimal on CT. Visualized joint spaces are grossly preserved. Chronic cystic change is noted at the mid talus. The subtalar joint is unremarkable in appearance. There is mild chronic deformity about the distal tibia and fibula, reflecting remote injury. The cartilage is not well assessed on CT. A small ankle joint effusion is noted. An os naviculare is noted. An os peroneum is seen. Ligaments Suboptimally assessed by CT. Muscles and Tendons The visualized flexor and extensor tendons are grossly unremarkable. The Achilles tendon is unremarkable in appearance. The  peroneal tendons appear grossly intact. The nodularity of musculature along the dorsum of the midfoot may reflect underlying degenerative change. Soft tissues The soft tissues are otherwise unremarkable in appearance. IMPRESSION: 1. No evidence of fracture or dislocation. No definite osseous erosions seen to suggest osteomyelitis, though evaluation for osteomyelitis is somewhat suboptimal on CT. 2. Small ankle joint effusion noted. 3. Mild chronic deformity about the distal tibia and fibula, reflecting remote injury. Chronic cystic change at the mid talus. Electronically Signed   By: Garald Balding M.D.   On: 11/21/2018 00:11   Dg Chest Port 1 View  Result Date: 11/20/2018 CLINICAL DATA:  Gout within both legs. Diabetes and high blood pressure. EXAM: PORTABLE CHEST 1 VIEW COMPARISON:  02/02/2018 FINDINGS: Mildly degraded exam due to AP portable technique and patient body habitus. Numerous leads and wires project over the chest. Apical lordotic positioning. Midline trachea. Moderate cardiomegaly. No right and no gross left-sided pleural effusion. No pneumothorax. No congestive failure. Clear lungs. IMPRESSION: 1. Cardiomegaly without congestive failure. 2. Decreased sensitivity and specificity exam due to technique related factors. Electronically Signed   By: Abigail Miyamoto M.D.   On: 11/20/2018 21:45    EKG: Independently reviewed.  Sinus rhythm (heart rate 89), nonspecific T wave abnormality.  No acute change.  Assessment/Plan Principal Problem:   Cellulitis Active Problems:   Essential hypertension   Chronic combined systolic and diastolic CHF (congestive heart failure) (HCC)   OSA (obstructive sleep apnea) with BiPap and oxygen   Type II diabetes mellitus with manifestations (HCC)   AF (paroxysmal atrial fibrillation) (H[facility])   Gout flare   Right foot cellulitis, possible gout flare  -Suspect cellulitis of right foot.  Gout is also on the differential. Left foot first MTP joint appears swollen.  Right foot with edema of  first toe and the entire dorsum of the foot; skin appears mildly erythematous. Afebrile. White count 12.5.  Lactic acid negative.  ESR and CRP elevated.  Uric acid 7.4. -CT of right tibia/fibula showing nonspecific mild edema within the subcutaneous soft tissues of the distal lower leg; negative for acute osseous abnormality.  CT of right foot without contrast showing small ankle joint effusion; negative for acute osseous abnormality. -Received a dose of vancomycin and ceftriaxone in the ED. Continue antibiotic coverage with cefazolin. -Patient reports previously taking steroids with no improvement.  Received a dose of Toradol.  Give naproxen 500 mg BID.  -Repeat CBC in a.m.  Type 2 diabetes Blood glucose 120 on admission.  Currently diet controlled.  Patient is refusing carb modified diet.  -Check A1c -Sliding scale insulin sensitive -CBG checks   GERD -Continue PPI  Hypertension -Continue home metoprolol  Chronic combined systolic and diastolic congestive heart failure -BNP normal and chest x-ray without evidence of overt pulmonary edema. -Continue home metoprolol. -Hold home losartan, spironolactone, torsemide at this time.  Paroxysmal A. Fib CHA2DS2VASc 5. Currently in sinus rhythm.   -Per cardiology note from Apr 22, 2018, patient has not been anticoagulated in 3 years and reportedly does not want to be.  Consider discussing this further with the patient during this hospitalization. -Continue home beta-blocker  Anxiety, insomnia -Continue home Klonopin as needed, trazodone as needed   OSA -Continue BiPAP at night  DVT prophylaxis: Lovenox Code Status: Full code Family Communication: Family at bedside. Disposition Plan: Anticipate discharge after clinical improvement. Consults called: None Admission status: Observation   Shela Leff MD [facility] Pager [phone]  If 7PM-7AM, please contact  night-coverage [email or web address] Password [facility]  11/21/2018, 4:55 AM

## 2018-11-21 NOTE — Progress Notes (Signed)
PT Cancellation Note  Patient Details Name: [name] MRN: [id number] DOB: 23-Apr-1966   Cancelled Treatment:    Reason Eval/Treat Not Completed: Other (comment)(Pt does not feel he needs PT)  Lengthy discussion with pt. [place] [facility] [place]. He reports the only reason he is having difficulty now is from the pain in his right foot. He has been getting up in the room by himself and with his friend and has  Walked to [employer] with RW. He does not feel he needs a PT consult and hopes that as pain improves he will be walking much better.  If pt still having significant pain when he discharges he may need a bariatric RW.  If there is a change in status or pt changes his mind please reorder PT.  PT will sign off at this time.  Lavonia Dana, PT   Acute Rehabilitation Services  Pager [phone] Office [phone] 11/21/2018     [name] 11/21/2018, 11:01 AM

## 2018-11-21 NOTE — ED Notes (Signed)
Pt given [place] sandwich and water. EDP Tyrone Nine stated pt can eat and drink.)

## 2018-11-21 NOTE — Progress Notes (Signed)
Patient admitted after midnight, please see H&P.  Here with cellulitis.  Plan to continue IV abx overnight and possible d/c in AM.  A1c: 6.6.  Asked patient to elevated extremity.  B/L dry skin on feet suspect skin cracking is how cellulitis started. Eulogio Bear DO

## 2018-11-22 ENCOUNTER — Observation Stay (HOSPITAL_BASED_OUTPATIENT_CLINIC_OR_DEPARTMENT_OTHER): Payer: Medicare HMO

## 2018-11-22 DIAGNOSIS — I480 Paroxysmal atrial fibrillation: Secondary | ICD-10-CM | POA: Diagnosis not present

## 2018-11-22 DIAGNOSIS — L0390 Cellulitis, unspecified: Secondary | ICD-10-CM | POA: Diagnosis not present

## 2018-11-22 DIAGNOSIS — M7989 Other specified soft tissue disorders: Secondary | ICD-10-CM

## 2018-11-22 DIAGNOSIS — G4733 Obstructive sleep apnea (adult) (pediatric): Secondary | ICD-10-CM

## 2018-11-22 DIAGNOSIS — I5042 Chronic combined systolic (congestive) and diastolic (congestive) heart failure: Secondary | ICD-10-CM | POA: Diagnosis not present

## 2018-11-22 DIAGNOSIS — I10 Essential (primary) hypertension: Secondary | ICD-10-CM | POA: Diagnosis not present

## 2018-11-22 LAB — BASIC METABOLIC PANEL
ANION GAP: 10 (ref 5–15)
BUN: 19 mg/dL (ref 6–20)
CO2: 24 mmol/L (ref 22–32)
Calcium: 8.6 mg/dL — ABNORMAL LOW (ref 8.9–10.3)
Chloride: 103 mmol/L (ref 98–111)
Creatinine, Ser: 1.49 mg/dL — ABNORMAL HIGH (ref 0.61–1.24)
GFR calc Af Amer: >60 mL/min (ref >60)
GFR calc non Af Amer: 54 mL/min — ABNORMAL LOW (ref >60)
Glucose, Bld: 115 mg/dL — ABNORMAL HIGH (ref 70–99)
Potassium: 3.8 mmol/L (ref 3.5–5.1)
Sodium: 137 mmol/L (ref 135–145)

## 2018-11-22 LAB — GLUCOSE, CAPILLARY
GLUCOSE-CAPILLARY: 141 mg/dL — AB (ref 70–99)
GLUCOSE-CAPILLARY: 245 mg/dL — AB (ref 70–99)
Glucose-Capillary: 112 mg/dL — ABNORMAL HIGH (ref 70–99)
Glucose-Capillary: 134 mg/dL — ABNORMAL HIGH (ref 70–99)
Glucose-Capillary: 167 mg/dL — ABNORMAL HIGH (ref 70–99)

## 2018-11-22 MED ORDER — SPIRONOLACTONE 25 MG PO TABS
25.0000 mg | ORAL_TABLET | Freq: Every day | ORAL | Status: DC
  Administered 2018-11-22: 25 mg via ORAL
  Filled 2018-11-22: qty 1

## 2018-11-22 MED ORDER — NICOTINE 21 MG/24HR TD PT24
21.0000 mg | MEDICATED_PATCH | Freq: Every day | TRANSDERMAL | Status: DC
  Filled 2018-11-22 (×2): qty 1

## 2018-11-22 MED ORDER — LOSARTAN POTASSIUM 50 MG PO TABS
50.0000 mg | ORAL_TABLET | Freq: Every day | ORAL | Status: DC
  Administered 2018-11-22: 50 mg via ORAL
  Filled 2018-11-22: qty 1

## 2018-11-22 MED ORDER — METHYLPREDNISOLONE SODIUM SUCC 125 MG IJ SOLR
80.0000 mg | Freq: Every day | INTRAMUSCULAR | Status: AC
  Administered 2018-11-22: 80 mg via INTRAVENOUS
  Filled 2018-11-22: qty 2

## 2018-11-22 NOTE — Progress Notes (Signed)
Bilateral lower extremities venous duplex exam completed. Please see preliminary notes on CV PROC under chart review.  Paulmichael Schreck H Lakelyn Straus(RDMS RVT) 11/22/18 5:03 PM

## 2018-11-22 NOTE — Progress Notes (Addendum)
PROGRESS NOTE        PATIENT DETAILS Name: [name] Age: 52 y.o. Sex: male Date of Birth: Jul 26, 1966 Admit Date: 11/20/2018 Admitting Physician Shela Leff, MD HAL:[id number], Arvid Right, MD  Brief Narrative: Patient is a 52 y.o. male with history of chronic systolic heart failure, gout (claims no longer on allopurinol as LFTs were increased a few months back) DM-2, GERD, PAF-presented to the hospital for evaluation of right great toe pain and swelling-thought to have either soft tissue infection/cellulitis or a acute gouty arthritis-started on empiric IV antimicrobial therapy, and empiric NSAIDs for gout.  Unfortunately even with these measures, patient continues to have significant pain and swelling of the right dorsal foot area/and right ankle-hence trying IV steroids today.  See below for further details  Subjective: Claims continues to have significant pain in the right great toe and adjacent dorsum of the foot area,.  Claims also has pain in the right ankle area.  Claims that the pain is severe-requiring frequent dosing of tramadol.  He is unable to bear weight and ambulate due to pain.  Assessment/Plan: Right foot/ankle pain and swelling: Very hard to discern whether this is infectious etiology (leukocytosis present) or from acute gouty arthritis.  Continues to have significant pain and swelling in spite of IV Ancef.  Patient was placed on naproxen on admission for acute gouty arthritis-without any significant relief.  We will give 1 dose of IV Solu-Medrol-continue empiric antimicrobial therapy-and reassess tomorrow.  If has a dramatic response to IV steroids-may be reasonable to consider discontinuing antimicrobial therapy and discharging home on a short tapering course of prednisone.  If no response to steroids, then may need to change to a alternate antimicrobial agent.  Do I doubt DVT-I suspect it is prudent to get a lower extremity Doppler as well.  On  exam-he does not have any fluctuant area that is suggestive of a surgical issue/abscess.  CT scan and right leg were negative for abscess/osteomyelitis.    Chronic systolic heart failure (EF 20-25% by TTE on July 2019): Well compensated-continue torsemide Aldactone, losartan and beta-blocker-not a candidate for Entresto due to angioedema with lisinopril.  PAF: Sinus rhythm on admit EKG-continue metoprolol.  Reviewed prior outpatient CHF clinic note-has refused anticoagulation in the past.  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  DM-2: CBG stable with SSI-now that patient will be given IV steroids-watch closely.  OSA: Continue BiPAP nightly  Anxiety/insomnia: Stable-continue Klonopin and trazodone as needed.  GERD: Continue PPI  Tobacco use: Counseled-start transdermal nicotine.  DVT Prophylaxis: Prophylactic Lovenox   Code Status: Full code  Family Communication: Significant other at bedside  Disposition Plan: Remain inpatient  Antimicrobial agents: Anti-infectives (From admission, onward)   Start     Dose/Rate Route Frequency Ordered Stop   11/21/18 0600  ceFAZolin (ANCEF) IVPB 2g/100 mL premix     2 g 200 mL/hr over 30 Minutes Intravenous Every 8 hours 11/21/18 0440     11/21/18 0000  vancomycin (VANCOCIN) 2,000 mg in sodium chloride 0.9 % 500 mL IVPB     2,000 mg 250 mL/hr over 120 Minutes Intravenous  Once 11/20/18 2356 11/21/18 0620   11/21/18 0000  cefTRIAXone (ROCEPHIN) 2 g in sodium chloride 0.9 % 100 mL IVPB     2 g 200 mL/hr over 30 Minutes Intravenous  Once 11/20/18 2356  11/21/18 0106      Procedures: None  CONSULTS: None  Time spent: 25- minutes-Greater than 50% of this time was spent in counseling, explanation of diagnosis, planning of further management, and coordination of care.  MEDICATIONS: Scheduled Meds: . aspirin EC  81 mg Oral Q1200  . B-complex with vitamin C  1 tablet Oral Q1200  . enoxaparin  (LOVENOX) injection  40 mg Subcutaneous Daily  . hydrocerin   Topical BID  . insulin aspart  0-9 Units Subcutaneous TID WC  . methylPREDNISolone (SOLU-MEDROL) injection  80 mg Intravenous Daily  . metoprolol succinate  100 mg Oral Daily   And  . metoprolol succinate  50 mg Oral QHS  . omega-3 acid ethyl esters  1,000 mg Oral Q1200  . pantoprazole  40 mg Oral Q1200   Continuous Infusions: .  ceFAZolin (ANCEF) IV 2 g (11/22/18 0503)   PRN Meds:.clonazePAM, iohexol, magnesium oxide, traMADol, traZODone   PHYSICAL EXAM: Vital signs: Vitals:   11/21/18 2141 11/22/18 0130 11/22/18 0552 11/22/18 1001  BP:   113/74 107/75  Pulse: 68 60 (!) 52 66  Resp:  18 18   Temp:   98.1 F (36.7 C)   TempSrc:   Oral   SpO2: 100% 100% 100%   Weight:      Height:       Filed Weights   11/20/18 2033 11/21/18 0210  Weight: (!) 176.9 kg (!) 179.5 kg   Body mass index is 60.17 kg/m.   General appearance :Awake, alert, not in any distress.  Eyes:Pink conjunctiva HEENT: Atraumatic and Normocephalic Neck: supple Resp:Good air entry bilaterally CVS: S1 S2 regular GI: Bowel sounds present, Non tender and not distended with no gaurding, rigidity or rebound.No organomegaly Extremities: B/L Lower Ext shows no edema, both legs are warm to touch.  Right ankle is tender but without any obvious effusion.  Right dorsum foot-anterior aspect is very tender-slightly erythematous.  Significant tenderness to the right great toe area. Neurology:  speech clear,Non focal, sensation is grossly intact. Psychiatric: Normal judgment and insight. Alert and oriented x 3. Normal mood. Musculoskeletal:No digital cyanosis Skin:No Rash, warm and dry Wounds:N/A  I have personally reviewed following labs and imaging studies  LABORATORY DATA: CBC: Recent Labs  Lab 11/20/18 2131 11/21/18 0949  WBC 12.5* 11.1*  HGB 15.9 14.4  HCT 45.6 41.9  MCV 86.0 84.1  PLT 280 161    Basic Metabolic Panel: Recent Labs  Lab  11/20/18 2131 11/22/18 0708  NA 139 137  K 4.2 3.8  CL 104 103  CO2 24 24  GLUCOSE 120* 115*  BUN 10 19  CREATININE 1.20 1.49*  CALCIUM 9.1 8.6*    GFR: Estimated Creatinine Clearance: 94.6 mL/min (A) (by C-G formula based on SCr of 1.49 mg/dL (H)).  Liver Function Tests: Recent Labs  Lab 11/20/18 2131  AST 18  ALT 26  ALKPHOS 108  BILITOT 0.7  PROT 8.4*  ALBUMIN 3.5   No results for input(s): LIPASE, AMYLASE in the last 168 hours. No results for input(s): AMMONIA in the last 168 hours.  Coagulation Profile: No results for input(s): INR, PROTIME in the last 168 hours.  Cardiac Enzymes: No results for input(s): CKTOTAL, CKMB, CKMBINDEX, TROPONINI in the last 168 hours.  BNP (last 3 results) No results for input(s): PROBNP in the last 8760 hours.  HbA1C: Recent Labs    11/21/18 0949  HGBA1C 6.6*    CBG: Recent Labs  Lab 11/21/18 1650 11/21/18 2236 11/22/18 0654  11/22/18 1127 11/22/18 1129  GLUCAP 129* 116* 112* 134* 141*    Lipid Profile: No results for input(s): CHOL, HDL, LDLCALC, TRIG, CHOLHDL, LDLDIRECT in the last 72 hours.  Thyroid Function Tests: No results for input(s): TSH, T4TOTAL, FREET4, T3FREE, THYROIDAB in the last 72 hours.  Anemia Panel: No results for input(s): VITAMINB12, FOLATE, FERRITIN, TIBC, IRON, RETICCTPCT in the last 72 hours.  Urine analysis:    Component Value Date/Time   COLORURINE AMBER (A) 04/22/2018 0345   APPEARANCEUR HAZY (A) 04/22/2018 0345   LABSPEC 1.015 04/22/2018 0345   PHURINE 5.0 04/22/2018 0345   GLUCOSEU NEGATIVE 04/22/2018 0345   GLUCOSEU NEGATIVE 04/21/2018 1150   HGBUR NEGATIVE 04/22/2018 0345   BILIRUBINUR MODERATE (A) 04/22/2018 0345   KETONESUR NEGATIVE 04/22/2018 0345   PROTEINUR NEGATIVE 04/22/2018 0345   UROBILINOGEN 1.0 04/21/2018 1150   NITRITE NEGATIVE 04/22/2018 0345   LEUKOCYTESUR SMALL (A) 04/22/2018 0345    Sepsis Labs: Lactic Acid, Venous    Component Value Date/Time    LATICACIDVEN 1.57 11/20/2018 2150    MICROBIOLOGY: Recent Results (from the past 240 hour(s))  Blood culture (routine x 2)     Status: None (Preliminary result)   Collection Time: 11/20/18  9:35 PM  Result Value Ref Range Status   Specimen Description BLOOD RIGHT ANTECUBITAL  Final   Special Requests   Final    BOTTLES DRAWN AEROBIC AND ANAEROBIC Blood Culture adequate volume   Culture   Final    NO GROWTH 1 DAY Performed at [facility], [place] [address] [place], [place] [address]    Report Status PENDING  Incomplete  Blood culture (routine x 2)     Status: None (Preliminary result)   Collection Time: 11/21/18 12:30 AM  Result Value Ref Range Status   Specimen Description BLOOD LEFT HAND  Final   Special Requests   Final    BOTTLES DRAWN AEROBIC AND ANAEROBIC Blood Culture results may not be optimal due to an inadequate volume of blood received in culture bottles   Culture   Final    NO GROWTH 1 DAY Performed at [facility], [place] [address] [place], [place] [address]    Report Status PENDING  Incomplete    RADIOLOGY STUDIES/RESULTS: Ct Tibia Fibula Right Wo Contrast  Result Date: 11/21/2018 CLINICAL DATA:  Lower extremity pain EXAM: CT OF THE LOWER RIGHT EXTREMITY WITHOUT CONTRAST TECHNIQUE: Multidetector CT imaging of the right lower extremity was performed according to the standard protocol. COMPARISON:  None. FINDINGS: Bones/Joint/Cartilage No acute fracture or malalignment. No periostitis or bone destruction. Circumscribed lucent lesion in the talus, possibly a cyst. Deformity of the posterior malleolus of the distal tibia, suspect for old fracture deformity. Distal interosseous membrane calcification. Shallow trochlear groove at the femur with mild lateral deviation of the patella. Ligaments Suboptimally assessed by CT. Muscles and Tendons No significant muscular atrophy. No focal fluid collections. Medial and lateral ankle tendons grossly normal in position. Soft  tissues Mild nonspecific subcutaneous edema. No soft tissue emphysema. Negative for radiopaque foreign body. IMPRESSION: 1. No acute osseous abnormality. Negative for bone destruction or periostitis. Negative for soft tissue emphysema or focal fluid collection allowing for absence of contrast 2. Nonspecific mild edema within the subcutaneous soft tissues of the distal lower leg 3. Probable old fracture deformity of the posterior malleolus of the tibia. Electronically Signed   By: Donavan Foil M.D.   On: 11/21/2018 00:01   Ct Foot Right Wo Contrast  Result  Date: 11/21/2018 CLINICAL DATA:  Acute onset of right foot pain. Assess for osteomyelitis or necrotizing fasciitis. EXAM: CT OF THE RIGHT FOOT WITHOUT CONTRAST TECHNIQUE: Multidetector CT imaging of the right foot was performed according to the standard protocol. Multiplanar CT image reconstructions were also generated. COMPARISON:  Right great toe radiographs performed 02/12/2016 FINDINGS: Bones/Joint/Cartilage There is no evidence of fracture or dislocation. No definite osseous erosions are seen to suggest osteomyelitis, though evaluation for osteomyelitis is somewhat suboptimal on CT. Visualized joint spaces are grossly preserved. Chronic cystic change is noted at the mid talus. The subtalar joint is unremarkable in appearance. There is mild chronic deformity about the distal tibia and fibula, reflecting remote injury. The cartilage is not well assessed on CT. A small ankle joint effusion is noted. An os naviculare is noted. An os peroneum is seen. Ligaments Suboptimally assessed by CT. Muscles and Tendons The visualized flexor and extensor tendons are grossly unremarkable. The Achilles tendon is unremarkable in appearance. The peroneal tendons appear grossly intact. The nodularity of musculature along the dorsum of the midfoot may reflect underlying degenerative change. Soft tissues The soft tissues are otherwise unremarkable in appearance. IMPRESSION: 1.  No evidence of fracture or dislocation. No definite osseous erosions seen to suggest osteomyelitis, though evaluation for osteomyelitis is somewhat suboptimal on CT. 2. Small ankle joint effusion noted. 3. Mild chronic deformity about the distal tibia and fibula, reflecting remote injury. Chronic cystic change at the mid talus. Electronically Signed   By: Garald Balding M.D.   On: 11/21/2018 00:11   Dg Chest Port 1 View  Result Date: 11/20/2018 CLINICAL DATA:  Gout within both legs. Diabetes and high blood pressure. EXAM: PORTABLE CHEST 1 VIEW COMPARISON:  02/02/2018 FINDINGS: Mildly degraded exam due to AP portable technique and patient body habitus. Numerous leads and wires project over the chest. Apical lordotic positioning. Midline trachea. Moderate cardiomegaly. No right and no gross left-sided pleural effusion. No pneumothorax. No congestive failure. Clear lungs. IMPRESSION: 1. Cardiomegaly without congestive failure. 2. Decreased sensitivity and specificity exam due to technique related factors. Electronically Signed   By: Abigail Miyamoto M.D.   On: 11/20/2018 21:45     LOS: 0 days   Oren Binet, MD  [facility]  If 7PM-7AM, please contact night-coverage  Please page via [email or web address] [email or web address]-click on MD name and type text message  11/22/2018, 2:29 PM

## 2018-11-22 NOTE — Progress Notes (Addendum)
Pt went downstairs to smoke with spouse. MD notified.  0930: Pt returned to floor with spouse.

## 2018-11-23 ENCOUNTER — Ambulatory Visit: Payer: Self-pay | Admitting: Gastroenterology

## 2018-11-23 DIAGNOSIS — I480 Paroxysmal atrial fibrillation: Secondary | ICD-10-CM | POA: Diagnosis not present

## 2018-11-23 DIAGNOSIS — I10 Essential (primary) hypertension: Secondary | ICD-10-CM | POA: Diagnosis not present

## 2018-11-23 DIAGNOSIS — I5042 Chronic combined systolic (congestive) and diastolic (congestive) heart failure: Secondary | ICD-10-CM | POA: Diagnosis not present

## 2018-11-23 DIAGNOSIS — G4733 Obstructive sleep apnea (adult) (pediatric): Secondary | ICD-10-CM | POA: Diagnosis not present

## 2018-11-23 DIAGNOSIS — L0390 Cellulitis, unspecified: Secondary | ICD-10-CM | POA: Diagnosis not present

## 2018-11-23 LAB — GLUCOSE, CAPILLARY
Glucose-Capillary: 266 mg/dL — ABNORMAL HIGH (ref 70–99)
Glucose-Capillary: 263 mg/dL — ABNORMAL HIGH (ref 70–99)
Glucose-Capillary: 247 mg/dL — ABNORMAL HIGH (ref 70–99)

## 2018-11-23 LAB — BASIC METABOLIC PANEL
Anion gap: 10 (ref 5–15)
BUN: 23 mg/dL — AB (ref 6–20)
CO2: 21 mmol/L — ABNORMAL LOW (ref 22–32)
Calcium: 8.9 mg/dL (ref 8.9–10.3)
Chloride: 104 mmol/L (ref 98–111)
Creatinine, Ser: 1.6 mg/dL — ABNORMAL HIGH (ref 0.61–1.24)
GFR calc Af Amer: 57 mL/min — ABNORMAL LOW (ref >60)
GFR calc non Af Amer: 49 mL/min — ABNORMAL LOW (ref >60)
GLUCOSE: 262 mg/dL — AB (ref 70–99)
Potassium: 4.3 mmol/L (ref 3.5–5.1)
Sodium: 135 mmol/L (ref 135–145)

## 2018-11-23 MED ORDER — INSULIN ASPART 100 UNIT/ML FLEXPEN: PEN_INJECTOR | SUBCUTANEOUS | 0 refills | Status: DC

## 2018-11-23 MED ORDER — LOSARTAN POTASSIUM 50 MG PO TABS: 50.0000 mg | ORAL_TABLET | Freq: Every day | ORAL | 3 refills | Status: DC

## 2018-11-23 MED ORDER — METHYLPREDNISOLONE SODIUM SUCC 125 MG IJ SOLR
60.0000 mg | Freq: Once | INTRAMUSCULAR | Status: DC
  Filled 2018-11-23: qty 2

## 2018-11-23 MED ORDER — SPIRONOLACTONE 25 MG PO TABS: 25.0000 mg | ORAL_TABLET | Freq: Every day | ORAL | Status: DC

## 2018-11-23 MED ORDER — PREDNISONE 10 MG PO TABS: ORAL_TABLET | ORAL | 0 refills | Status: DC

## 2018-11-23 MED ORDER — INSULIN PEN NEEDLE 32G X 8 MM MISC: 0 refills | Status: DC

## 2018-11-23 MED ORDER — LOSARTAN POTASSIUM 50 MG PO TABS: 50.0000 mg | ORAL_TABLET | Freq: Every day | ORAL | Status: DC

## 2018-11-23 MED ORDER — METHYLPREDNISOLONE SODIUM SUCC 125 MG IJ SOLR
INTRAMUSCULAR | Status: AC
  Filled 2018-11-23: qty 2

## 2018-11-23 NOTE — Progress Notes (Signed)
Pt given discharge instructions and prescriptions. Instructions gone over with him, answered all questions. All belongings gathered. Awaiting ride.

## 2018-11-23 NOTE — Discharge Summary (Signed)
PATIENT DETAILS Name: [name] Age: 52 y.o. Sex: male Date of Birth: 20-Jan-1966 MRN: [id number]. Admitting Physician: Shela Leff, MD [id number], Arvid Right, MD  Admit Date: 11/20/2018 Discharge date: 11/23/2018  Recommendations for Outpatient Follow-up:  1. Follow up with PCP in 1-2 weeks 2. Please obtain BMP in one week 3. Please obtain a repeat left lower extremity Doppler in 1 week (see below)  Admitted From:  Home  Disposition: [place]: No  Equipment/Devices: None  Discharge Condition: Stable  CODE STATUS: FULL CODE  Diet recommendation:  Heart Healthy / Carb Modified   Brief Summary: See H&P, Labs, Consult and Test reports for all details in brief, Patient is a 52 y.o. male with history of chronic systolic heart failure, gout (claims no longer on allopurinol as LFTs were increased a few months back) DM-2, GERD, PAF-presented to the hospital for evaluation of right great toe pain and swelling-thought to have either soft tissue infection/cellulitis or a acute gouty arthritis-started on empiric IV antimicrobial therapy, and empiric NSAIDs for gout.  Unfortunately even with these measures, patient was then given IV Solu-Medrol on 12/23 with significant improvement of his symptoms on 12/24.  See below for further details.    Brief Hospital Course: Acute gouty arthritis: Initially treated with NSAIDs without any improvement of her symptoms, was also placed on empiric antimicrobial therapy due to concern for soft tissue infection.  However in spite of these measures patient failed to improve, he was subsequently given IV Solu-Medrol on 12/23 with significant improvement in his symptoms-he is now able to ambulate, pain is very minimal-and is anxious to go home.  We will give him another dose of IV Solu-Medrol, and put him on a short tapering course of prednisone.  He previously was on allopurinol-but apparently that was stopped due to concerns of  elevated LFTs.  I have asked him to follow-up with his PCP, and to ask about restarting a alternative anti-uric acid agent when his acute gouty arthritis resolves.  Do not think patient has had infection hence will not be discharged on antimicrobial therapy.  He did have a CT scan of his right foot and right leg that did not show any acute abnormalities.  Mild acute kidney injury: I suspect this is secondary to NSAID use-this has been discontinued as of 12/23.  He will continue on his regular diuretic regimen-we will hold his losartan for a few days-hopefully in a few days his electrolytes will stabilize and will improve.  I have asked him to follow-up with the CHF clinic in the next few days for repeat blood work.  Chronic systolic heart failure (EF 20-25% by TTE on July 2019): Well compensated-continue torsemide Aldactone, losartan and beta-blocker-not a candidate for Entresto due to angioedema with lisinopril.  PAF: Sinus rhythm on admit EKG-continue metoprolol.  Reviewed prior outpatient CHF clinic note-has refused anticoagulation in the past.  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 Lissa Morales surgery on call who reviewed the Doppler images, he does not think the patient has a DVT in the left popliteal vein, things the changes are [name] likely secondary to his body habitus.  He advises to continue on aspirin, and recheck a Doppler within 1 week.  This recommendation was discussed with the patient, I have asked him to follow-up with his primary care practitioner in 1 week.  DM-2: CBG stable with  SSI-CBGs are slightly elevated as the patient is on steroids.  Per patient in the past he was on a oral hypoglycemic agent that was discontinued after his liver enzymes went up.  After discussion with the patient-he is okay with being discharged on sliding  scale insulin for a few days-he will continue to follow his sugars-and once he is off the steroids and if his sugars are still high-he will follow-up with his PCP for further continued care.   OSA: Continue BiPAP nightly  Anxiety/insomnia: Stable-continue Klonopin and trazodone as needed.  GERD: Continue PPI  Tobacco use: Counseled  Procedures/Studies: None  Discharge Diagnoses:  Principal Problem:   Cellulitis Active Problems:   Essential hypertension   Chronic combined systolic and diastolic CHF (congestive heart failure) (HCC)   OSA (obstructive sleep apnea) with BiPap and oxygen   Type II diabetes mellitus with manifestations (HCC)   AF (paroxysmal atrial fibrillation) (HCC)   Gout flare   Discharge Instructions:  Activity:  As tolerated    Discharge Instructions    Diet - low sodium heart healthy   Complete by:  As directed    Discharge instructions   Complete by:  As directed    Follow with Primary MD  [name], MD in 1 week  Follow with the CHF clinic within 1 week for repeat electrolyte check.  Follow with your primary care MD for a repeat left lower extremity Doppler ultrasound  Please get a complete blood count and chemistry panel checked by your Primary MD at your next visit, and again as instructed by your Primary MD.  Get Medicines reviewed and adjusted: Please take all your medications with you for your next visit with your Primary MD  Laboratory/radiological data: Please request your Primary MD to go over all hospital tests and procedure/radiological results at the follow up, please ask your Primary MD to get all Hospital records sent to his/her office.  In some cases, they will be blood work, cultures and biopsy results pending at the time of your discharge. Please request that your primary care M.D. follows up on these results.  Also Note the following: If you experience worsening of your admission symptoms, develop shortness of  breath, life threatening emergency, suicidal or homicidal thoughts you must seek medical attention immediately by calling 911 or calling your MD immediately  if symptoms less severe.  You must read complete instructions/literature along with all the possible adverse reactions/side effects for all the Medicines you take and that have been prescribed to you. Take any new Medicines after you have completely understood and accpet all the possible adverse reactions/side effects.   Do not drive when taking Pain medications or sleeping medications (Benzodaizepines)  Do not take [name] than prescribed Pain, Sleep and Anxiety Medications. It is not advisable to combine anxiety,sleep and pain medications without talking with your primary care practitioner  Special Instructions: If you have smoked or chewed Tobacco  in the last 2 yrs please stop smoking, stop any regular Alcohol  and or any Recreational drug use.  Wear Seat belts while driving.  Please note: You were cared for by a hospitalist during your hospital stay. Once you are discharged, your primary care physician will handle any further medical issues. Please note that NO REFILLS for any discharge medications will be authorized once you are discharged, as it is imperative that you return to your primary care physician (or establish a relationship with a primary care physician if you do not have one) for  your post hospital discharge needs so that they can reassess your need for medications and monitor your lab values.   Increase activity slowly   Complete by:  As directed      Allergies as of 11/23/2018      Reactions   Ace Inhibitors Anaphylaxis, Swelling   Buspirone Other (See Comments)   dizziness      Medication List    TAKE these medications   aspirin EC 81 MG tablet Take 81 mg by mouth daily at 12 noon.   atorvastatin 40 MG tablet Commonly known as:  LIPITOR Take 1 tablet (40 mg total) by mouth daily.   b complex vitamins  tablet Take 1 tablet by mouth daily at 12 noon.   blood glucose meter kit and supplies Kit Use to check blood sugar. DX E11.9   clonazePAM 1 MG tablet Commonly known as:  KLONOPIN TAKE 1 TABLET BY MOUTH 3 TIMES DAILY AS NEEDED FOR ANXIETY What changed:    how much to take  how to take this  when to take this  additional instructions   Fish Oil 1000 MG Caps Take 1,000 mg by mouth daily at 12 noon.   insulin aspart 100 UNIT/ML FlexPen Commonly known as:  NOVOLOG FLEXPEN 0-15 Units, Subcutaneous, 3 times daily with meals CBG < 70: implement hypoglycemia protocol-call MD CBG 70 - 120: 0 units CBG 121 - 150: 2 units CBG 151 - 200: 3 units CBG 201 - 250: 5 units CBG 251 - 300: 8 units CBG 301 - 350: 11 units CBG 351 - 400: 15 units CBG > 400:   Insulin Pen Needle 32G X 8 MM Misc Use as directed   losartan 50 MG tablet Commonly known as:  COZAAR Take 1 tablet (50 mg total) by mouth daily. Start taking on:  November 25, 2018 What changed:  These instructions start on November 25, 2018. If you are unsure what to do until then, ask your doctor or other care provider.   magnesium oxide 400 MG tablet Commonly known as:  MAG-OX Take 400 mg by mouth daily as needed (leg cramps).   metoprolol succinate 100 MG 24 hr tablet Commonly known as:  TOPROL-XL Take 1 tab in the morning and take 0.5 tab in the evening What changed:    how much to take  how to take this  when to take this  additional instructions   pantoprazole 40 MG tablet Commonly known as:  PROTONIX TAKE 1 TABLET (40 MG TOTAL) BY MOUTH DAILY. What changed:    how much to take  how to take this  when to take this  additional instructions   potassium chloride SA 20 MEQ tablet Commonly known as:  K-DUR,KLOR-CON Take 20 mEq by mouth daily as needed (cramping).   predniSONE 10 MG tablet Commonly known as:  DELTASONE Take 4 tablets (40 mg) daily for 1 day, then, Take 3 tablets (30 mg) daily for 1  day, then, Take 2 tablets (20 mg) daily for 1 day, then, Take 1 tablets (10 mg) daily for 1 day, then stop   PRESCRIPTION MEDICATION Inhale into the lungs See admin instructions. Bipap, pressure 16/12 with 2L of O2 - use whenever sleeping   sildenafil 20 MG tablet Commonly known as:  REVATIO Take 20 mg by mouth daily as needed (erectile dysfunction).   spironolactone 25 MG tablet Commonly known as:  ALDACTONE TAKE 1 TABLET BY MOUTH EVERY DAY What changed:  when to take this  TART CHERRY ADVANCED Caps Take 2 capsules by mouth daily at 12 noon. 1 capsule - 1200 mg   thiamine 100 MG tablet Commonly known as:  VITAMIN B-1 Take 1 tablet (100 mg total) by mouth daily.   torsemide 20 MG tablet Commonly known as:  DEMADEX Take 20-40 mg by mouth daily at 12 noon.   traZODone 50 MG tablet Commonly known as:  DESYREL Take 1 tablet (50 mg total) by mouth at bedtime. What changed:    when to take this  reasons to take this      Follow-up Information    [name], MD. Schedule an appointment as soon as possible for a visit in 1 week(s).   Specialty:  Internal Medicine Contact information: [address] [place] [address] [phone]        [name], MD. Schedule an appointment as soon as possible for a visit in 1 week(s).   Specialty:  Cardiology Contact information: [place] [address] [phone]          Allergies  Allergen Reactions  . Ace Inhibitors Anaphylaxis and Swelling  . Buspirone Other (See Comments)    dizziness    Consultations:   None   Other Procedures/Studies: Ct Tibia Fibula Right Wo Contrast  Result Date: 11/21/2018 CLINICAL DATA:  Lower extremity pain EXAM: CT OF THE LOWER RIGHT EXTREMITY WITHOUT CONTRAST TECHNIQUE: Multidetector CT imaging of the right lower extremity was performed according to the standard protocol. COMPARISON:  None. FINDINGS: Bones/Joint/Cartilage No acute  fracture or malalignment. No periostitis or bone destruction. Circumscribed lucent lesion in the talus, possibly a cyst. Deformity of the posterior malleolus of the distal tibia, suspect for old fracture deformity. Distal interosseous membrane calcification. Shallow trochlear groove at the femur with mild lateral deviation of the patella. Ligaments Suboptimally assessed by CT. Muscles and Tendons No significant muscular atrophy. No focal fluid collections. Medial and lateral ankle tendons grossly normal in position. Soft tissues Mild nonspecific subcutaneous edema. No soft tissue emphysema. Negative for radiopaque foreign body. IMPRESSION: 1. No acute osseous abnormality. Negative for bone destruction or periostitis. Negative for soft tissue emphysema or focal fluid collection allowing for absence of contrast 2. Nonspecific mild edema within the subcutaneous soft tissues of the distal lower leg 3. Probable old fracture deformity of the posterior malleolus of the tibia. Electronically Signed   By: Donavan Foil M.D.   On: 11/21/2018 00:01   Ct Foot Right Wo Contrast  Result Date: 11/21/2018 CLINICAL DATA:  Acute onset of right foot pain. Assess for osteomyelitis or necrotizing fasciitis. EXAM: CT OF THE RIGHT FOOT WITHOUT CONTRAST TECHNIQUE: Multidetector CT imaging of the right foot was performed according to the standard protocol. Multiplanar CT image reconstructions were also generated. COMPARISON:  Right great toe radiographs performed 02/12/2016 FINDINGS: Bones/Joint/Cartilage There is no evidence of fracture or dislocation. No definite osseous erosions are seen to suggest osteomyelitis, though evaluation for osteomyelitis is somewhat suboptimal on CT. Visualized joint spaces are grossly preserved. Chronic cystic change is noted at the mid talus. The subtalar joint is unremarkable in appearance. There is mild chronic deformity about the distal tibia and fibula, reflecting remote injury. The cartilage is not  well assessed on CT. A small ankle joint effusion is noted. An os naviculare is noted. An os peroneum is seen. Ligaments Suboptimally assessed by CT. Muscles and Tendons The visualized flexor and extensor tendons are grossly unremarkable. The Achilles tendon is unremarkable in appearance.  The peroneal tendons appear grossly intact. The nodularity of musculature along the dorsum of the midfoot may reflect underlying degenerative change. Soft tissues The soft tissues are otherwise unremarkable in appearance. IMPRESSION: 1. No evidence of fracture or dislocation. No definite osseous erosions seen to suggest osteomyelitis, though evaluation for osteomyelitis is somewhat suboptimal on CT. 2. Small ankle joint effusion noted. 3. Mild chronic deformity about the distal tibia and fibula, reflecting remote injury. Chronic cystic change at the mid talus. Electronically Signed   By: Garald Balding M.D.   On: 11/21/2018 00:11   Dg Chest Port 1 View  Result Date: 11/20/2018 CLINICAL DATA:  Gout within both legs. Diabetes and high blood pressure. EXAM: PORTABLE CHEST 1 VIEW COMPARISON:  02/02/2018 FINDINGS: Mildly degraded exam due to AP portable technique and patient body habitus. Numerous leads and wires project over the chest. Apical lordotic positioning. Midline trachea. Moderate cardiomegaly. No right and no gross left-sided pleural effusion. No pneumothorax. No congestive failure. Clear lungs. IMPRESSION: 1. Cardiomegaly without congestive failure. 2. Decreased sensitivity and specificity exam due to technique related factors. Electronically Signed   By: Abigail Miyamoto M.D.   On: 11/20/2018 21:45   Vas [place] Lower Extremity Venous (dvt)  Result Date: 11/22/2018  Lower Venous Study Indications: Pain, Swelling, Edema, SOB, and history of PE.  Limitations: Body habitus and musculoskeletal features. Comparison Study: Lower extremity venous duplex exam on 04/13/2017 Performing Technologist: Rudell Cobb  Examination  Guidelines: A complete evaluation includes B-mode imaging, spectral Doppler, color Doppler, and power Doppler as needed of all accessible portions of each vessel. Bilateral testing is considered an integral part of a complete examination. Limited examinations for reoccurring indications may be performed as noted.  Right Venous Findings: +--------+---------------+---------+-----------+----------+--------------------+         CompressibilityPhasicitySpontaneityPropertiesSummary              +--------+---------------+---------+-----------+----------+--------------------+ CFV     Full           Yes      Yes                                       +--------+---------------+---------+-----------+----------+--------------------+ SFJ     Full                                                              +--------+---------------+---------+-----------+----------+--------------------+ FV Prox Full                                                              +--------+---------------+---------+-----------+----------+--------------------+ FV Mid  Full                                                              +--------+---------------+---------+-----------+----------+--------------------+ FV      Full  Distal                                                                    +--------+---------------+---------+-----------+----------+--------------------+ PFV     Full                                                              +--------+---------------+---------+-----------+----------+--------------------+ POP     Full           Yes      Yes                                       +--------+---------------+---------+-----------+----------+--------------------+ PTV                                                  very poor                                                                  visualization        +--------+---------------+---------+-----------+----------+--------------------+ PERO                                                 very poor                                                                 visualization        +--------+---------------+---------+-----------+----------+--------------------+  Left Venous Findings: +---------+---------------+---------+-----------+----------+-------------------+          CompressibilityPhasicitySpontaneityPropertiesSummary             +---------+---------------+---------+-----------+----------+-------------------+ CFV      Full           Yes      Yes                                      +---------+---------------+---------+-----------+----------+-------------------+ SFJ      Full                                                             +---------+---------------+---------+-----------+----------+-------------------+  FV Prox  Full                                                             +---------+---------------+---------+-----------+----------+-------------------+ FV Mid   Full                                                             +---------+---------------+---------+-----------+----------+-------------------+ FV DistalFull                                                             +---------+---------------+---------+-----------+----------+-------------------+ PFV      Full                                                             +---------+---------------+---------+-----------+----------+-------------------+ POP                     Yes      Yes                  a short segment                                                           cannot compress     +---------+---------------+---------+-----------+----------+-------------------+ PTV                                                   difficult to see     +---------+---------------+---------+-----------+----------+-------------------+ PERO                                                  difficult to see    +---------+---------------+---------+-----------+----------+-------------------+ GSV      Full                                                             +---------+---------------+---------+-----------+----------+-------------------+ Left popliteal vein has a short segment cannot compress with good flow.    Summary: Bilateral calf veins with very poor visualization, study cannot completely exclude the possibility of calf veins thrombosis. Right: No cystic structure found in the popliteal  fossa. Left: popliteal vein: good flow with a short segment cannot compress, maybe due to body habitus vs questionable thrombosis. No cystic structure found in the popliteal fossa.  *See table(s) above for measurements and observations.    Preliminary      TODAY-DAY OF DISCHARGE:  Subjective:   [name] today has no headache,no chest abdominal pain,no new weakness tingling or numbness, feels much better wants to go home today.   Objective:   Blood pressure 117/67, pulse 74, temperature (!) 95.9 F (35.5 C), temperature source Rectal, resp. rate (!) 22, height 5' 8"  (1.727 m), weight (!) 179.5 kg, SpO2 94 %.  Intake/Output Summary (Last 24 hours) at 11/23/2018 0943 Last data filed at 11/22/2018 1700 Gross per 24 hour  Intake 960 ml  Output 1 ml  Net 959 ml   Filed Weights   11/20/18 2033 11/21/18 0210  Weight: (!) 176.9 kg (!) 179.5 kg    Exam: Awake Alert, Oriented *3, No new F.N deficits, Normal affect [place].AT,PERRAL Supple Neck,No JVD, No cervical lymphadenopathy appriciated.  Symmetrical Chest wall movement, Good air movement bilaterally, CTAB RRR,No Gallops,Rubs or new Murmurs, No Parasternal Heave +ve B.Sounds, Abd Soft, Non tender, No organomegaly appriciated, No rebound -guarding or rigidity. No Cyanosis, Clubbing or  edema, No new Rash or bruise   PERTINENT RADIOLOGIC STUDIES: Ct Tibia Fibula Right Wo Contrast  Result Date: 11/21/2018 CLINICAL DATA:  Lower extremity pain EXAM: CT OF THE LOWER RIGHT EXTREMITY WITHOUT CONTRAST TECHNIQUE: Multidetector CT imaging of the right lower extremity was performed according to the standard protocol. COMPARISON:  None. FINDINGS: Bones/Joint/Cartilage No acute fracture or malalignment. No periostitis or bone destruction. Circumscribed lucent lesion in the talus, possibly a cyst. Deformity of the posterior malleolus of the distal tibia, suspect for old fracture deformity. Distal interosseous membrane calcification. Shallow trochlear groove at the femur with mild lateral deviation of the patella. Ligaments Suboptimally assessed by CT. Muscles and Tendons No significant muscular atrophy. No focal fluid collections. Medial and lateral ankle tendons grossly normal in position. Soft tissues Mild nonspecific subcutaneous edema. No soft tissue emphysema. Negative for radiopaque foreign body. IMPRESSION: 1. No acute osseous abnormality. Negative for bone destruction or periostitis. Negative for soft tissue emphysema or focal fluid collection allowing for absence of contrast 2. Nonspecific mild edema within the subcutaneous soft tissues of the distal lower leg 3. Probable old fracture deformity of the posterior malleolus of the tibia. Electronically Signed   By: Donavan Foil M.D.   On: 11/21/2018 00:01   Ct Foot Right Wo Contrast  Result Date: 11/21/2018 CLINICAL DATA:  Acute onset of right foot pain. Assess for osteomyelitis or necrotizing fasciitis. EXAM: CT OF THE RIGHT FOOT WITHOUT CONTRAST TECHNIQUE: Multidetector CT imaging of the right foot was performed according to the standard protocol. Multiplanar CT image reconstructions were also generated. COMPARISON:  Right great toe radiographs performed 02/12/2016 FINDINGS: Bones/Joint/Cartilage There is no evidence of fracture or  dislocation. No definite osseous erosions are seen to suggest osteomyelitis, though evaluation for osteomyelitis is somewhat suboptimal on CT. Visualized joint spaces are grossly preserved. Chronic cystic change is noted at the mid talus. The subtalar joint is unremarkable in appearance. There is mild chronic deformity about the distal tibia and fibula, reflecting remote injury. The cartilage is not well assessed on CT. A small ankle joint effusion is noted. An os naviculare is noted. An os peroneum is seen. Ligaments Suboptimally assessed by CT. Muscles and Tendons The visualized flexor and extensor tendons are grossly  unremarkable. The Achilles tendon is unremarkable in appearance. The peroneal tendons appear grossly intact. The nodularity of musculature along the dorsum of the midfoot may reflect underlying degenerative change. Soft tissues The soft tissues are otherwise unremarkable in appearance. IMPRESSION: 1. No evidence of fracture or dislocation. No definite osseous erosions seen to suggest osteomyelitis, though evaluation for osteomyelitis is somewhat suboptimal on CT. 2. Small ankle joint effusion noted. 3. Mild chronic deformity about the distal tibia and fibula, reflecting remote injury. Chronic cystic change at the mid talus. Electronically Signed   By: Garald Balding M.D.   On: 11/21/2018 00:11   Dg Chest Port 1 View  Result Date: 11/20/2018 CLINICAL DATA:  Gout within both legs. Diabetes and high blood pressure. EXAM: PORTABLE CHEST 1 VIEW COMPARISON:  02/02/2018 FINDINGS: Mildly degraded exam due to AP portable technique and patient body habitus. Numerous leads and wires project over the chest. Apical lordotic positioning. Midline trachea. Moderate cardiomegaly. No right and no gross left-sided pleural effusion. No pneumothorax. No congestive failure. Clear lungs. IMPRESSION: 1. Cardiomegaly without congestive failure. 2. Decreased sensitivity and specificity exam due to technique related  factors. Electronically Signed   By: Abigail Miyamoto M.D.   On: 11/20/2018 21:45   Vas [place] Lower Extremity Venous (dvt)  Result Date: 11/22/2018  Lower Venous Study Indications: Pain, Swelling, Edema, SOB, and history of PE.  Limitations: Body habitus and musculoskeletal features. Comparison Study: Lower extremity venous duplex exam on 04/13/2017 Performing Technologist: Rudell Cobb  Examination Guidelines: A complete evaluation includes B-mode imaging, spectral Doppler, color Doppler, and power Doppler as needed of all accessible portions of each vessel. Bilateral testing is considered an integral part of a complete examination. Limited examinations for reoccurring indications may be performed as noted.  Right Venous Findings: +--------+---------------+---------+-----------+----------+--------------------+         CompressibilityPhasicitySpontaneityPropertiesSummary              +--------+---------------+---------+-----------+----------+--------------------+ CFV     Full           Yes      Yes                                       +--------+---------------+---------+-----------+----------+--------------------+ SFJ     Full                                                              +--------+---------------+---------+-----------+----------+--------------------+ FV Prox Full                                                              +--------+---------------+---------+-----------+----------+--------------------+ FV Mid  Full                                                              +--------+---------------+---------+-----------+----------+--------------------+ FV      Full  Distal                                                                    +--------+---------------+---------+-----------+----------+--------------------+ PFV     Full                                                               +--------+---------------+---------+-----------+----------+--------------------+ POP     Full           Yes      Yes                                       +--------+---------------+---------+-----------+----------+--------------------+ PTV                                                  very poor                                                                 visualization        +--------+---------------+---------+-----------+----------+--------------------+ PERO                                                 very poor                                                                 visualization        +--------+---------------+---------+-----------+----------+--------------------+  Left Venous Findings: +---------+---------------+---------+-----------+----------+-------------------+          CompressibilityPhasicitySpontaneityPropertiesSummary             +---------+---------------+---------+-----------+----------+-------------------+ CFV      Full           Yes      Yes                                      +---------+---------------+---------+-----------+----------+-------------------+ SFJ      Full                                                             +---------+---------------+---------+-----------+----------+-------------------+  FV Prox  Full                                                             +---------+---------------+---------+-----------+----------+-------------------+ FV Mid   Full                                                             +---------+---------------+---------+-----------+----------+-------------------+ FV DistalFull                                                             +---------+---------------+---------+-----------+----------+-------------------+ PFV      Full                                                              +---------+---------------+---------+-----------+----------+-------------------+ POP                     Yes      Yes                  a short segment                                                           cannot compress     +---------+---------------+---------+-----------+----------+-------------------+ PTV                                                   difficult to see    +---------+---------------+---------+-----------+----------+-------------------+ PERO                                                  difficult to see    +---------+---------------+---------+-----------+----------+-------------------+ GSV      Full                                                             +---------+---------------+---------+-----------+----------+-------------------+ Left popliteal vein has a short segment cannot compress with good flow.    Summary: Bilateral calf veins with very poor visualization, study cannot completely exclude the possibility of calf veins thrombosis. Right: No cystic structure found in the popliteal  fossa. Left: popliteal vein: good flow with a short segment cannot compress, maybe due to body habitus vs questionable thrombosis. No cystic structure found in the popliteal fossa.  *See table(s) above for measurements and observations.    Preliminary      PERTINENT LAB RESULTS: CBC: Recent Labs    11/20/18 2131 11/21/18 0949  WBC 12.5* 11.1*  HGB 15.9 14.4  HCT 45.6 41.9  PLT 280 244   CMET CMP     Component Value Date/Time   NA 135 11/23/2018 0344   K 4.3 11/23/2018 0344   CL 104 11/23/2018 0344   CO2 21 (L) 11/23/2018 0344   GLUCOSE 262 (H) 11/23/2018 0344   BUN 23 (H) 11/23/2018 0344   CREATININE 1.60 (H) 11/23/2018 0344   CREATININE 1.18 08/24/2017 1059   CALCIUM 8.9 11/23/2018 0344   PROT 8.4 (H) 11/20/2018 2131   ALBUMIN 3.5 11/20/2018 2131   AST 18 11/20/2018 2131   ALT 26 11/20/2018 2131   ALKPHOS 108 11/20/2018 2131   BILITOT  0.7 11/20/2018 2131   GFRNONAA 49 (L) 11/23/2018 0344   GFRAA 57 (L) 11/23/2018 0344    GFR Estimated Creatinine Clearance: 88.1 mL/min (A) (by C-G formula based on SCr of 1.6 mg/dL (H)). No results for input(s): LIPASE, AMYLASE in the last 72 hours. No results for input(s): CKTOTAL, CKMB, CKMBINDEX, TROPONINI in the last 72 hours. Invalid input(s): POCBNP No results for input(s): DDIMER in the last 72 hours. Recent Labs    11/21/18 0949  HGBA1C 6.6*   No results for input(s): CHOL, HDL, LDLCALC, TRIG, CHOLHDL, LDLDIRECT in the last 72 hours. No results for input(s): TSH, T4TOTAL, T3FREE, THYROIDAB in the last 72 hours.  Invalid input(s): FREET3 No results for input(s): VITAMINB12, FOLATE, FERRITIN, TIBC, IRON, RETICCTPCT in the last 72 hours. Coags: No results for input(s): INR in the last 72 hours.  Invalid input(s): PT Microbiology: Recent Results (from the past 240 hour(s))  Blood culture (routine x 2)     Status: None (Preliminary result)   Collection Time: 11/20/18  9:35 PM  Result Value Ref Range Status   Specimen Description BLOOD RIGHT ANTECUBITAL  Final   Special Requests   Final    BOTTLES DRAWN AEROBIC AND ANAEROBIC Blood Culture adequate volume   Culture   Final    NO GROWTH 2 DAYS Performed at [facility], [address] [place], [place] [address]    Report Status PENDING  Incomplete  Blood culture (routine x 2)     Status: None (Preliminary result)   Collection Time: 11/21/18 12:30 AM  Result Value Ref Range Status   Specimen Description BLOOD LEFT HAND  Final   Special Requests   Final    BOTTLES DRAWN AEROBIC AND ANAEROBIC Blood Culture results may not be optimal due to an inadequate volume of blood received in culture bottles   Culture   Final    NO GROWTH 2 DAYS Performed at [facility], [address], [place],  [address]    Report Status PENDING  Incomplete    FURTHER DISCHARGE INSTRUCTIONS:  Get Medicines reviewed and  adjusted: Please take all your medications with you for your next visit with your Primary MD  Laboratory/radiological data: Please request your Primary MD to go over all hospital tests and procedure/radiological results at the follow up, please ask your Primary MD to get all Hospital records sent to his/her office.  In some cases, they will be blood work, cultures  and biopsy results pending at the time of your discharge. Please request that your primary care M.D. goes through all the records of your hospital data and follows up on these results.  Also Note the following: If you experience worsening of your admission symptoms, develop shortness of breath, life threatening emergency, suicidal or homicidal thoughts you must seek medical attention immediately by calling 911 or calling your MD immediately  if symptoms less severe.  You must read complete instructions/literature along with all the possible adverse reactions/side effects for all the Medicines you take and that have been prescribed to you. Take any new Medicines after you have completely understood and accpet all the possible adverse reactions/side effects.   Do not drive when taking Pain medications or sleeping medications (Benzodaizepines)  Do not take [name] than prescribed Pain, Sleep and Anxiety Medications. It is not advisable to combine anxiety,sleep and pain medications without talking with your primary care practitioner  Special Instructions: If you have smoked or chewed Tobacco  in the last 2 yrs please stop smoking, stop any regular Alcohol  and or any Recreational drug use.  Wear Seat belts while driving.  Please note: You were cared for by a hospitalist during your hospital stay. Once you are discharged, your primary care physician will handle any further medical issues. Please note that NO REFILLS for any discharge medications will be authorized once you are discharged, as it is imperative that you return to your primary  care physician (or establish a relationship with a primary care physician if you do not have one) for your post hospital discharge needs so that they can reassess your need for medications and monitor your lab values.  Total Time spent coordinating discharge including counseling, education and face to face time equals 35 minutes.  SignedOren Binet 11/23/2018 9:43 AM

## 2018-11-23 NOTE — Progress Notes (Signed)
Marked response to IV Solu-Medrol-right foot pain is 90% better-he is now able to ambulate.  We will give him another shot of Solu-Medrol before discharge him on a short course of prednisone taper.  Have asked patient to follow-up with his PCP to start prophylactic gout treatment after this flare is resolved.  Have asked him to follow-up with his primary cardiologist within 1 week to recheck his electrolytes.  See discharge summary for further details

## 2018-11-25 ENCOUNTER — Encounter: Payer: Self-pay | Admitting: Gastroenterology

## 2018-11-25 ENCOUNTER — Telehealth: Payer: Self-pay

## 2018-11-25 NOTE — Telephone Encounter (Addendum)
Transition Care Management Follow-up Telephone Call   Date discharged? 11/23/18   How have you been since you were released from the hospital? Pain is starting again in great R toe. Pain scale 0/10 he reports 4. Appetite has decreased. Monitoring blood sugars; averaging 118.     Do you understand why you were in the hospital? Yes, pain in great R toe which spread across the foot.   Do you understand the discharge instructions? Yes, take all medications as prescribed. Increase activity as tolerated.  Schedule and keep all appointments. Bring all medications to pcp appointment, repeat Left lower leg doppler ultrasound, CBC and CMP.    Where were you discharged to? Home.    Items Reviewed:  Medications reviewed: Yes.  He has not started the prednisone taper yet, plans to pick up medication today.  Taking all other schedule medications without issues.   Allergies reviewed: Yes.  Dietary changes reviewed:  Yes. Low sodium, heart healthy/carb modified.  Referrals reviewed: Yes. CHF clinic appointment scheduled 12/07/2018.  Repeat electrolyte check.    Functional Questionnaire:   Activities of Daily Living (ADLs):   He states they are independent in the following: Independent in all ADLs.   States they require assistance with the following: Requires no assistance with ADLs. He paces himself when the pain increases.    Any transportation issues/concerns?: No. He is able to drive as long as he is not in pain.    Any patient concerns? Patient requests to discuss restarting alternative anti-uric acid agent when his gouty arthritis resolve and physician to CALL IN same dose of prednisone the hospital prescribed (10mg  taper) to his local pharmacy today so he does not have to physically hand deliver since the pain in great R toe is starting to flare again.    Confirmed importance and date/time of follow-up visits scheduled YES 12/02/18 at 1030.  Provider Appointment booked with Scarlette Calico, MD (pcp).  Confirmed with patient if condition begins to worsen call PCP or go to the ER.  Patient was given the office number and encouraged to call back with question or concerns.  : Yes.

## 2018-11-26 LAB — CULTURE, BLOOD (ROUTINE X 2)
Culture: NO GROWTH
Culture: NO GROWTH
Special Requests: ADEQUATE

## 2018-11-30 ENCOUNTER — Ambulatory Visit (INDEPENDENT_AMBULATORY_CARE_PROVIDER_SITE_OTHER): Payer: Medicare HMO | Admitting: Internal Medicine

## 2018-11-30 ENCOUNTER — Encounter: Payer: Self-pay | Admitting: Internal Medicine

## 2018-11-30 ENCOUNTER — Other Ambulatory Visit: Payer: Medicare HMO

## 2018-11-30 VITALS — BP 128/84 | HR 69 | Temp 98.1°F | Ht 68.0 in | Wt 398.0 lb

## 2018-11-30 DIAGNOSIS — I2699 Other pulmonary embolism without acute cor pulmonale: Secondary | ICD-10-CM

## 2018-11-30 DIAGNOSIS — M19072 Primary osteoarthritis, left ankle and foot: Secondary | ICD-10-CM

## 2018-11-30 DIAGNOSIS — I480 Paroxysmal atrial fibrillation: Secondary | ICD-10-CM | POA: Diagnosis not present

## 2018-11-30 DIAGNOSIS — N182 Chronic kidney disease, stage 2 (mild): Secondary | ICD-10-CM

## 2018-11-30 DIAGNOSIS — E1140 Type 2 diabetes mellitus with diabetic neuropathy, unspecified: Secondary | ICD-10-CM

## 2018-11-30 DIAGNOSIS — M170 Bilateral primary osteoarthritis of knee: Secondary | ICD-10-CM

## 2018-11-30 DIAGNOSIS — M19071 Primary osteoarthritis, right ankle and foot: Secondary | ICD-10-CM

## 2018-11-30 LAB — D-DIMER, QUANTITATIVE: D-Dimer, Quant: 0.8 mcg/mL FEU — ABNORMAL HIGH (ref <0.50)

## 2018-11-30 MED ORDER — RIVAROXABAN 20 MG PO TABS: 20.0000 mg | ORAL_TABLET | Freq: Every day | ORAL | 1 refills | Status: DC

## 2018-11-30 MED ORDER — TRAMADOL HCL 50 MG PO TABS: 50.0000 mg | ORAL_TABLET | Freq: Four times a day (QID) | ORAL | 3 refills | Status: DC | PRN

## 2018-11-30 NOTE — Patient Instructions (Signed)

## 2018-11-30 NOTE — Progress Notes (Signed)
Subjective:  Patient ID: [name], male    DOB: [birth year]  Age: 52 y.o. MRN: [id number]  CC: Osteoarthritis   HPI [name] presents for f/up - He was recently admitted for evaluation and treatment of foot pain.  He was at home and had foot pain that was so severe he called EMS and was taken to the hospital.  He describes the pain as aching in his ankle joints and 1st MTP joints.  He also complains of a stabbing and stinging sensation on the dorsum of his toes and feet on both sides.  His work-up was relatively unremarkable.  A CT of both ankles and feet showed degenerative changes but no evidence of cellulitis, osteomyelitis, or erosive joint disease.  Steroids were prescribed and he says they initially helped for a day or 2 but then his blood sugar spiked up into the 300s so he became concerned and has stopped taking the steroids.  He also had an ultrasound of the lower extremities that raised some concern for thrombosis in his left calf.  He has a history of pulmonary embolus and atrial fibrillation.  To date, he has not been willing to take an anticoagulant.  Outpatient Medications Prior to Visit  Medication Sig Dispense Refill  . atorvastatin (LIPITOR) 40 MG tablet Take 1 tablet (40 mg total) by mouth daily. 90 tablet 1  . b complex vitamins tablet Take 1 tablet by mouth daily at 12 noon.    . blood glucose meter kit and supplies KIT Use to check blood sugar. DX E11.9 1 each 0  . insulin aspart (NOVOLOG FLEXPEN) 100 UNIT/ML FlexPen 0-15 Units, Subcutaneous, 3 times daily with meals CBG < 70: implement hypoglycemia protocol-call MD CBG 70 - 120: 0 units CBG 121 - 150: 2 units CBG 151 - 200: 3 units CBG 201 - 250: 5 units CBG 251 - 300: 8 units CBG 301 - 350: 11 units CBG 351 - 400: 15 units CBG > 400: 15 mL 0  . Insulin Pen Needle 32G X 8 MM MISC Use as directed 100 each 0  . losartan (COZAAR) 50 MG tablet Take 1 tablet (50 mg total) by mouth daily. 90 tablet 3  .  magnesium oxide (MAG-OX) 400 MG tablet Take 400 mg by mouth daily as needed (leg cramps).     . metoprolol succinate (TOPROL-XL) 100 MG 24 hr tablet Take 1 tab in the morning and take 0.5 tab in the evening (Patient taking differently: Take 50-100 mg by mouth See admin instructions. Take one tablet (100 mg) by mouth daily at noon and 1/2 tablet (50 mg) at night) 45 tablet 3  . Misc Natural Products (TART CHERRY ADVANCED) CAPS Take 2 capsules by mouth daily at 12 noon. 1 capsule - 1200 mg    . Omega-3 Fatty Acids (FISH OIL) 1000 MG CAPS Take 1,000 mg by mouth daily at 12 noon.    . pantoprazole (PROTONIX) 40 MG tablet TAKE 1 TABLET (40 MG TOTAL) BY MOUTH DAILY. (Patient taking differently: Take 40 mg by mouth daily at 12 noon. ) 90 tablet 3  . potassium chloride SA (K-DUR,KLOR-CON) 20 MEQ tablet Take 20 mEq by mouth daily as needed (cramping).    Marland Kitchen PRESCRIPTION MEDICATION Inhale into the lungs See admin instructions. Bipap, pressure 16/12 with 2L of O2 - use whenever sleeping    . spironolactone (ALDACTONE) 25 MG tablet TAKE 1 TABLET BY MOUTH EVERY DAY (Patient taking differently: Take 25 mg by mouth  daily at 12 noon. ) 90 tablet 3  . thiamine (VITAMIN B-1) 100 MG tablet Take 1 tablet (100 mg total) by mouth daily. 90 tablet 1  . torsemide (DEMADEX) 20 MG tablet Take 20-40 mg by mouth daily at 12 noon.     . traZODone (DESYREL) 50 MG tablet Take 1 tablet (50 mg total) by mouth at bedtime. (Patient taking differently: Take 50 mg by mouth at bedtime as needed for sleep. ) 90 tablet 1  . aspirin EC 81 MG tablet Take 81 mg by mouth daily at 12 noon.    . clonazePAM (KLONOPIN) 1 MG tablet TAKE 1 TABLET BY MOUTH 3 TIMES DAILY AS NEEDED FOR ANXIETY (Patient taking differently: Take 1 mg by mouth 3 (three) times daily. ) 90 tablet 3  . sildenafil (REVATIO) 20 MG tablet Take 20 mg by mouth daily as needed (erectile dysfunction).   3  . predniSONE (DELTASONE) 10 MG tablet Take 4 tablets (40 mg) daily for 1 day,  then, Take 3 tablets (30 mg) daily for 1 day, then, Take 2 tablets (20 mg) daily for 1 day, then, Take 1 tablets (10 mg) daily for 1 day, then stop (Patient not taking: Reported on 11/30/2018) 10 tablet 0   No facility-administered medications prior to visit.     ROS Review of Systems  Constitutional: Negative.  Negative for appetite change, chills, diaphoresis, fatigue and fever.  HENT: Negative.   Eyes: Negative for visual disturbance.  Respiratory: Negative for cough, chest tightness, shortness of breath and wheezing.   Cardiovascular: Negative for chest pain, palpitations and leg swelling.  Gastrointestinal: Negative for abdominal pain, constipation, diarrhea, nausea and vomiting.  Endocrine: Negative.  Negative for polydipsia, polyphagia and polyuria.  Genitourinary: Negative.  Negative for difficulty urinating.  Musculoskeletal: Positive for arthralgias. Negative for back pain, joint swelling and myalgias.  Skin: Negative.  Negative for color change and pallor.  Neurological: Negative.  Negative for dizziness, weakness, light-headedness and numbness.  Hematological: Negative for adenopathy. Does not bruise/bleed easily.  Psychiatric/Behavioral: Negative.     Objective:  BP 128/84   Pulse 69   Temp 98.1 F (36.7 C) (Oral)   Ht 5' 8"  (1.727 m)   Wt (!) 398 lb (180.5 kg)   SpO2 97%   BMI 60.52 kg/m   BP Readings from Last 3 Encounters:  11/30/18 128/84  11/23/18 117/67  10/18/18 120/90    Wt Readings from Last 3 Encounters:  11/30/18 (!) 398 lb (180.5 kg)  11/21/18 (!) 395 lb 11.6 oz (179.5 kg)  10/18/18 (!) 402 lb 6.4 oz (182.5 kg)    Physical Exam Constitutional:      General: He is not in acute distress.    Appearance: He is obese. He is not ill-appearing, toxic-appearing or diaphoretic.  HENT:     Nose: Nose normal.     Mouth/Throat:     Mouth: Mucous membranes are moist.     Pharynx: No oropharyngeal exudate or posterior oropharyngeal erythema.  Eyes:       Conjunctiva/sclera: Conjunctivae normal.  Neck:     Musculoskeletal: Normal range of motion and neck supple.  Cardiovascular:     Rate and Rhythm: Normal rate and regular rhythm.     Heart sounds: No murmur. No gallop.   Pulmonary:     Effort: Pulmonary effort is normal.     Breath sounds: Normal breath sounds. No stridor. No wheezing, rhonchi or rales.  Abdominal:     General: Bowel sounds are normal.  Palpations: There is no hepatomegaly, splenomegaly or mass.     Tenderness: There is no abdominal tenderness. There is no guarding.     Hernia: No hernia is present.  Musculoskeletal:     Right foot: Normal range of motion.     Left foot: Normal range of motion.  Feet:     Right foot:     Skin integrity: Dry skin present. No ulcer, blister, skin breakdown, erythema, warmth, callus or fissure.     Toenail Condition: Right toenails are normal.     Left foot:     Skin integrity: Dry skin present. No ulcer, blister, skin breakdown, erythema, warmth, callus or fissure.     Toenail Condition: Left toenails are normal.     Lab Results  Component Value Date   WBC 11.1 (H) 11/21/2018   HGB 14.4 11/21/2018   HCT 41.9 11/21/2018   PLT 244 11/21/2018   GLUCOSE 262 (H) 11/23/2018   CHOL 241 (H) 09/22/2018   TRIG 332.0 (H) 09/22/2018   HDL 28.90 (L) 09/22/2018   LDLDIRECT 166.0 09/22/2018   LDLCALC 89 01/15/2017   ALT 26 11/20/2018   AST 18 11/20/2018   NA 135 11/23/2018   K 4.3 11/23/2018   CL 104 11/23/2018   CREATININE 1.60 (H) 11/23/2018   BUN 23 (H) 11/23/2018   CO2 21 (L) 11/23/2018   TSH 1.89 02/02/2018   PSA 0.38 04/21/2018   INR 1.0 09/22/2018   HGBA1C 6.6 (H) 11/21/2018   MICROALBUR 0.8 02/02/2018    Ct Tibia Fibula Right Wo Contrast  Result Date: 11/21/2018 CLINICAL DATA:  Lower extremity pain EXAM: CT OF THE LOWER RIGHT EXTREMITY WITHOUT CONTRAST TECHNIQUE: Multidetector CT imaging of the right lower extremity was performed according to the standard  protocol. COMPARISON:  None. FINDINGS: Bones/Joint/Cartilage No acute fracture or malalignment. No periostitis or bone destruction. Circumscribed lucent lesion in the talus, possibly a cyst. Deformity of the posterior malleolus of the distal tibia, suspect for old fracture deformity. Distal interosseous membrane calcification. Shallow trochlear groove at the femur with mild lateral deviation of the patella. Ligaments Suboptimally assessed by CT. Muscles and Tendons No significant muscular atrophy. No focal fluid collections. Medial and lateral ankle tendons grossly normal in position. Soft tissues Mild nonspecific subcutaneous edema. No soft tissue emphysema. Negative for radiopaque foreign body. IMPRESSION: 1. No acute osseous abnormality. Negative for bone destruction or periostitis. Negative for soft tissue emphysema or focal fluid collection allowing for absence of contrast 2. Nonspecific mild edema within the subcutaneous soft tissues of the distal lower leg 3. Probable old fracture deformity of the posterior malleolus of the tibia. Electronically Signed   By: Donavan Foil M.D.   On: 11/21/2018 00:01   Ct Foot Right Wo Contrast  Result Date: 11/21/2018 CLINICAL DATA:  Acute onset of right foot pain. Assess for osteomyelitis or necrotizing fasciitis. EXAM: CT OF THE RIGHT FOOT WITHOUT CONTRAST TECHNIQUE: Multidetector CT imaging of the right foot was performed according to the standard protocol. Multiplanar CT image reconstructions were also generated. COMPARISON:  Right great toe radiographs performed 02/12/2016 FINDINGS: Bones/Joint/Cartilage There is no evidence of fracture or dislocation. No definite osseous erosions are seen to suggest osteomyelitis, though evaluation for osteomyelitis is somewhat suboptimal on CT. Visualized joint spaces are grossly preserved. Chronic cystic change is noted at the mid talus. The subtalar joint is unremarkable in appearance. There is mild chronic deformity about the  distal tibia and fibula, reflecting remote injury. The cartilage is not well assessed  on CT. A small ankle joint effusion is noted. An os naviculare is noted. An os peroneum is seen. Ligaments Suboptimally assessed by CT. Muscles and Tendons The visualized flexor and extensor tendons are grossly unremarkable. The Achilles tendon is unremarkable in appearance. The peroneal tendons appear grossly intact. The nodularity of musculature along the dorsum of the midfoot may reflect underlying degenerative change. Soft tissues The soft tissues are otherwise unremarkable in appearance. IMPRESSION: 1. No evidence of fracture or dislocation. No definite osseous erosions seen to suggest osteomyelitis, though evaluation for osteomyelitis is somewhat suboptimal on CT. 2. Small ankle joint effusion noted. 3. Mild chronic deformity about the distal tibia and fibula, reflecting remote injury. Chronic cystic change at the mid talus. Electronically Signed   By: Garald Balding M.D.   On: 11/21/2018 00:11   Dg Chest Port 1 View  Result Date: 11/20/2018 CLINICAL DATA:  Gout within both legs. Diabetes and high blood pressure. EXAM: PORTABLE CHEST 1 VIEW COMPARISON:  02/02/2018 FINDINGS: Mildly degraded exam due to AP portable technique and patient body habitus. Numerous leads and wires project over the chest. Apical lordotic positioning. Midline trachea. Moderate cardiomegaly. No right and no gross left-sided pleural effusion. No pneumothorax. No congestive failure. Clear lungs. IMPRESSION: 1. Cardiomegaly without congestive failure. 2. Decreased sensitivity and specificity exam due to technique related factors. Electronically Signed   By: Abigail Miyamoto M.D.   On: 11/20/2018 21:45    Assessment & Plan:   Leor was seen today for osteoarthritis.  Diagnoses and all orders for this visit:  Osteoarthritis of both ankles and feet -     traMADol (ULTRAM) 50 MG tablet; Take 1 tablet (50 mg total) by mouth every 6 (six) hours as  needed.  Diabetic neuropathy, painful (HCC) -     traMADol (ULTRAM) 50 MG tablet; Take 1 tablet (50 mg total) by mouth every 6 (six) hours as needed.  Primary osteoarthritis of both knees -     traMADol (ULTRAM) 50 MG tablet; Take 1 tablet (50 mg total) by mouth every 6 (six) hours as needed.  PE (pulmonary thromboembolism) ([place])- His d-dimer is not significantly elevated so I do not think he has active clotting indicative of a PE or DVT.  He has risk factors for recurrent thrombosis so I have asked him to start taking Xarelto. -     D-dimer, quantitative (not at [facility]); Future -     rivaroxaban (XARELTO) 20 MG TABS tablet; Take 1 tablet (20 mg total) by mouth daily with supper.  CKD (chronic kidney disease), stage II- His creatinine clearance is normal at 88.4 mL/minute.  AF (paroxysmal atrial fibrillation) ([place])- His CHA2DS2-VASc score is 5. Will start anticoagulation with Xarelto. -     rivaroxaban (XARELTO) 20 MG TABS tablet; Take 1 tablet (20 mg total) by mouth daily with supper.   I have discontinued Kahner L. Thelen's sildenafil, clonazePAM, aspirin EC, and predniSONE. I am also having him start on traMADol and rivaroxaban. Additionally, I am having him maintain his potassium chloride SA, magnesium oxide, pantoprazole, thiamine, blood glucose meter kit and supplies, torsemide, spironolactone, atorvastatin, traZODone, metoprolol succinate, Fish Oil, PRESCRIPTION MEDICATION, b complex vitamins, TART CHERRY ADVANCED, losartan, Insulin Pen Needle, and insulin aspart.  Meds ordered this encounter  Medications  . traMADol (ULTRAM) 50 MG tablet    Sig: Take 1 tablet (50 mg total) by mouth every 6 (six) hours as needed.    Dispense:  90 tablet    Refill:  3  .  rivaroxaban (XARELTO) 20 MG TABS tablet    Sig: Take 1 tablet (20 mg total) by mouth daily with supper.    Dispense:  90 tablet    Refill:  1     Follow-up: No follow-ups on file.  Scarlette Calico, MD

## 2018-12-01 ENCOUNTER — Encounter: Payer: Self-pay | Admitting: Internal Medicine

## 2018-12-01 DIAGNOSIS — I10 Essential (primary) hypertension: Secondary | ICD-10-CM | POA: Diagnosis not present

## 2018-12-01 DIAGNOSIS — I509 Heart failure, unspecified: Secondary | ICD-10-CM | POA: Diagnosis not present

## 2018-12-01 DIAGNOSIS — G4733 Obstructive sleep apnea (adult) (pediatric): Secondary | ICD-10-CM | POA: Diagnosis not present

## 2018-12-02 ENCOUNTER — Inpatient Hospital Stay: Payer: Self-pay | Admitting: Internal Medicine

## 2018-12-06 NOTE — Progress Notes (Signed)
Advanced Heart Failure Clinic Note   Primary Care:Dr. Scarlette Calico Primary Cardiologist: Dr. Aundra Dubin   HPI: [name] is a 53 y.o. male with a past medical history of NICM, EF 25-30% in March 2018, felt to be related to prior ETOH abuse. He also has a history of PE 03/2014 completed a years course of Xarelto, morbid obesity, OSA, and tobacco abuse.   He was admitted 11/12/15-11/14/15 with acute on chronic systolic CHF and palpitations. He wore a 30 day event monitor at discharge as he had frequent PVC's and questionable Afib on telemetry. Also with some NSVT, so he was started on Amiodaone, but at follow up had not started taking it. He had previously refused ICD and was seen inpatient by Dr. Rayann Heman who felt that his morbid obesity was a prohibitive factor.   He was seen in the clinic in April 2018. He had started drinking ETOH again. Volume status was stable, he is not an Entresto candidate due to angioedema with lisinopril. Weight was 411 pounds.   Admitted 04/12/17 with SOB, chest pain. D- dimer was 1.16, chest CT without central obstructing PE, however more peripheral and subsegmental pulmonary artery branches were not confidently evaluated due to his body habitus. He was started on a heparin gtt for presumed PE, however VQ scan showed no PE. Troponin was elevated, peaked at 3.37. LHC showed no CAD. Echo showed an EF of 15%, grade 2 DD, no pericarditis. He was diuresed with IV lasix, and started on torsemide 94m at discharge. Discharge weight was 405 pounds.   Admitted 5/22 through 04/24/2018 with abdominal pain, nausea, and vomiting. 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 Did not require surgical intervention. He will have follow up with GI.   Admitted 12/21-12/24/19 with severe foot pain. Uric acid was normal. CT of RLE showed degenerative changes, but no acute abnormalities. Pain improved with IV solu-medrol.  He had mild AKI thought to be secondary to NSAID use. Losartan was held for a few days  and resumed at discharge. He was discharged with prednisone taper.   He saw PCP on 11/30/18 and was started on Xarelto (has previously refused). He was given tramadol for osteoarthritis.  He returns today for post hospital follow up. Last visit atorvastatin was restarted with normalization of LFTs. Overall doing okay. He is feeling depressed about his recent hospitalization. He continues to have pain in his feet. He has not been very active due to pain. Denies SOB. No orthopnea, PND, or edema. No bleeding on Xarelto. No CP. He has palpitations that he says is related to stress. He has not been wearing BiPAP. He has been eating healthier. Weight is down to 391 lbs at home. Taking torsemide 20-30 mg daily. Has been taking all meds other than atorvastatin. He has applied for Medicaid.  ECG (personally reviewed): NSR, IVCD 132 msec  Labs (5/13): K 4.1, creatinine 1.05 Labs (1/14): K 3.8, creatinine 1.16, BNP 54 Labs (2/14): K 3.7, creatinine 1.11, BNP 28 Labs (2/16): K 4.1, creatinine 1.03, LDL 96, HCT 40 Labs (3/16): K 3.7, K 1.13, BNP 367 Labs (8/16): BNP 43, K 3.5, creatinine 1.01 Labs (08/13/15): K 3.7, creatinine 1.18, HCT 41.3 Labs (12/16): K 3.7, creatinine 1.14, TGs 495, digoxin < 0.2 Labs (2/17): K 3.8, creatinine 0.99, LDL 107, TGs 222 Labs (5/17): K 4, creatinine 1.47 Labs (2/18): K 3.9, creatinine 1.06, hgb 15.1, TGs 163, LDL 89, HDL 28, TSH normal  Labs (5/18): K 3.8, creatinine 1.12.  Labs (12/30/2017): K 3.8 Creatinine 1.25  Labs (01/28/2018): K 3.8 Creatinine 1.08 Labs 03/19/2018: K 4.1 Creatinine 1/15 BNP 212  Labs 04/29/3018: Creatinine 1.1  Labs (10/19): LDL 166, K 3.8, creatinine 1.01, AST 102 => 25, ALT 125 => 37, alkaline phosphatase 233  PMH: 1. Nonischemic cardiomyopathy: Prior cath with no significant CAD.  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  Prior echoes with EF as low as 25%.  Echo (9/13) with EF 35-40%, moderate to severe  LV dilation, diffuse hypokinesis, mild MR. Echo (5/15) with EF 30-35%, moderate to severe LAE, normal RV size and systolic function.  Angioedema with ACEI, headaches with hydralazine/nitrates. Echo (3/16) with EF 25-30%, severe LV dilation, normal RV size and systolic function.  [facility] 08/13/15 showed no significant coronary disease; RA mean 6, PA 33/11 mean 23, PCWP mean 13, Fick CO/CI 4.75 /1.68 (difficult study, radial artery spasm, if needs future cath would use groin).  Echo (9/16) showed EF 20-25%.   - Echo (3/18): EF 25-30%, moderate LAE - Echo 4/18 EF 15% - Echo 7/19 EF 20-25% 2. HTN: angioedema with ACEI.  3. OSA: on Bipap 4. Morbid obesity 5. Paroxysmal atrial fibrillation: Not documented recently.   6. Smoker.  7. Anxiety/panic attacks 8. PE: 5/15, diagnosed by V/Q scan. CTA chest 8/16 negative for PE.  9. NSVT, PVCs: 30 day monitor (12/16) with PVCs, PACs, no atrial fibrillation.  - Zio patch (9/19): few short NSVT runs, no atrial fibrillation, 1.1% PVCs 10. Hematuria: Apparently had negative workup by urology.  11. ABIs (6/16) were normal 12. 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  13. Gout 14. Low back pain.   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  Has son and daughter.    FH: No premature CAD.    Review of systems complete and found to be negative unless listed in HPI.    Current Outpatient Medications  Medication Sig Dispense Refill  . atorvastatin (LIPITOR) 40 MG tablet Take 1 tablet (40 mg total) by mouth daily. 90 tablet 1  . b complex vitamins tablet Take 1 tablet by mouth daily at 12 noon.    . blood glucose meter kit and supplies KIT Use to check blood sugar. DX E11.9 1 each 0  . insulin aspart (NOVOLOG FLEXPEN) 100 UNIT/ML FlexPen 0-15 Units, Subcutaneous, 3 times daily with meals CBG < 70: implement hypoglycemia protocol-call MD CBG 70 - 120: 0 units CBG 121 - 150: 2 units CBG 151 - 200: 3 units CBG 201  - 250: 5 units CBG 251 - 300: 8 units CBG 301 - 350: 11 units CBG 351 - 400: 15 units CBG > 400: 15 mL 0  . Insulin Pen Needle 32G X 8 MM MISC Use as directed 100 each 0  . losartan (COZAAR) 50 MG tablet Take 1 tablet (50 mg total) by mouth daily. 90 tablet 3  . magnesium oxide (MAG-OX) 400 MG tablet Take 400 mg by mouth daily as needed (leg cramps).     . metoprolol succinate (TOPROL-XL) 100 MG 24 hr tablet Take 1 tab in the morning and take 0.5 tab in the evening 45 tablet 3  . Misc Natural Products (TART CHERRY ADVANCED) CAPS Take 2 capsules by mouth daily at 12 noon. 1 capsule - 1200 mg    . Omega-3 Fatty Acids (FISH OIL) 1000 MG CAPS Take 1,000 mg by mouth daily at 12 noon.    . pantoprazole (PROTONIX) 40 MG  tablet TAKE 1 TABLET (40 MG TOTAL) BY MOUTH DAILY. 90 tablet 3  . potassium chloride SA (K-DUR,KLOR-CON) 20 MEQ tablet Take 20 mEq by mouth daily as needed (cramping).    Marland Kitchen PRESCRIPTION MEDICATION Inhale into the lungs See admin instructions. Bipap, pressure 16/12 with 2L of O2 - use whenever sleeping    . rivaroxaban (XARELTO) 20 MG TABS tablet Take 1 tablet (20 mg total) by mouth daily with supper. 90 tablet 1  . spironolactone (ALDACTONE) 25 MG tablet TAKE 1 TABLET BY MOUTH EVERY DAY 90 tablet 3  . thiamine (VITAMIN B-1) 100 MG tablet Take 1 tablet (100 mg total) by mouth daily. 90 tablet 1  . torsemide (DEMADEX) 20 MG tablet Take 20-40 mg by mouth daily at 12 noon.     . traMADol (ULTRAM) 50 MG tablet Take 1 tablet (50 mg total) by mouth every 6 (six) hours as needed. 90 tablet 3  . traZODone (DESYREL) 50 MG tablet Take 1 tablet (50 mg total) by mouth at bedtime. 90 tablet 1   No current facility-administered medications for this encounter.     Allergies  Allergen Reactions  . Ace Inhibitors Anaphylaxis and Swelling  . Buspirone Other (See Comments)    dizziness      Social History   Socioeconomic History  . Marital status: Married    Spouse name: Not on file  .  Number of children: 3  . Years of education: Not on file  . Highest education level: Not on file  Occupational History  . Occupation: DISABLED  Social Needs  . Financial resource strain: Not on file  . Food insecurity:    Worry: Not on file    Inability: Not on file  . Transportation needs:    Medical: Not on file    Non-medical: Not on file  Tobacco Use  . Smoking status: Current Every Day Smoker    Packs/day: 0.50    Years: 30.00    Pack years: 15.00    Types: Cigarettes  . Smokeless tobacco: Never Used  . Tobacco comment: Took information today to call for help with support to quit   Substance and Sexual Activity  . Alcohol use: Not Currently    Alcohol/week: 0.0 standard drinks    Comment: Reports none in over a month, trying to quit   . Drug use: No  . Sexual activity: Not Currently  Lifestyle  . Physical activity:    Days per week: Not on file    Minutes per session: Not on file  . Stress: Not on file  Relationships  . Social connections:    Talks on phone: Not on file    Gets together: Not on file    Attends religious service: Not on file    Active member of club or organization: Not on file    Attends meetings of clubs or organizations: Not on file    Relationship status: Not on file  . Intimate partner violence:    Fear of current or ex partner: Not on file    Emotionally abused: Not on file    Physically abused: Not on file    Forced sexual activity: Not on file  Other Topics Concern  . Not on file  Social History Narrative   He smokes about a pack per day and he has been smoking since he was 53 years of age.  He drinks alcohol occasionally, but he denies any illicit drug abuse.  He is presently on disability.  Lives with wife in a 2 story home.  Has 2 children.   Previously worked in [employer], last worked in 1998.   Highest level of education:  11th grade              Family History  Problem Relation Age of Onset  . Cancer Mother        brain  tumor  . Hypertension Mother   . Diabetes Father        Deceased, 53  . Heart disease Maternal Grandmother   . Hypertension Other        Family History  . Stroke Other        Family History  . Diabetes Other        Family History  . Diabetes Daughter     Vitals:   12/07/18 1401  BP: (!) 142/90  Pulse: 75  SpO2: 98%  Weight: (!) 180.2 kg (397 lb 6 oz)   Filed Weights   12/07/18 1401  Weight: (!) 180.2 kg (397 lb 6 oz)   Wt Readings from Last 3 Encounters:  12/07/18 (!) 180.2 kg (397 lb 6 oz)  11/30/18 (!) 180.5 kg (398 lb)  11/21/18 (!) 179.5 kg (395 lb 11.6 oz)    PHYSICAL EXAM: General: Obese. No resp difficulty. HEENT: Normal Neck: Supple. JVP 5-6. Carotids 2+ bilat; no bruits. No thyromegaly or nodule noted. Cor: PMI nondisplaced. RRR, No M/G/R noted Lungs: CTAB, normal effort. Abdomen: Soft, non-tender, non-distended, no HSM. No bruits or masses. +BS  Extremities: No cyanosis, clubbing, or rash. R and LLE no edema.  Neuro: Alert & orientedx3, cranial nerves grossly intact. moves all 4 extremities w/o difficulty. Affect pleasant   ASSESSMENT & PLAN: 1. Chronic systolic CHF: Nonischemic cardiomyopathy. Echo (3/18) with EF 25-30%. He was seen by EP and decided against ICD given marked obesity.  QRS not wide enough for CRT. Echo 05/2018 with EF 20-25%. NYHA class II-III symptoms. Volume stable on exam. Weight down 5 lbs since last visit.  - Continue torsemide 30 mg daily. Some days he does 20 and some days does 40  - Continue Spiro 25 mg daily - Continue Toprol XL 100 qam/50 qpm.  - Continue losartan 50 mg daily.  Check BMET today. - No entresto with h/o angioedema.  - Had side effects from digoxin, does not want to take. - Headaches with Bidil, cannot take.  2. Obesity: Continue to work on weight loss.  3. Smoking: Smoking 1 ppd. Encouraged cessation. No change. 4. OSA: Continue nightly BiPAP. Encouraged compliance.  5. Paroxysmal atrial fibrillation: None  documented recently.  Regular on exam. - Now on Xarelto. Continue. We discussed the reason for him being on Xarelto today.   6. PE: 04/14/2018, diagnosed by V/Q scan.  He was on Xarelto for about a year but stopped in the setting of hematuria.  - Now back on Xarelto. Denies any bleeding.  7. Anxiety/Panic attacks/Depression: Stopped Zoloft as afraid it was affecting his LFTs.  - He has been more depressed lately. Encouraged him to speak to PCP about medications.  8. Elevated LFTs: uncertain etiology.  LFTs normal 11/20/18. - He has followup with GI.  - Okay to resume statin, but he hasn't.  9. Osteoarthritis - Per PCP. Recommended trying the pool at the [facility]. 10. HTN - Elevated today. He declines med adjustments. He will monitor BP at home.  11. Hyperlipidemia - LDL 166 on 09/22/18 - Encouraged him to take atorvastatin. He is taking fish oil and  has made a lot of diet changes. - Recheck lipid panel today. He has not yet eaten.   He is stable from HF standpoint. BMET and lipid panel today. Follow up in 3 months with Dr Gwenith Daily Koren Bound 12/07/2018  Greater than 50% of the 25 minute visit was spent in counseling/coordination of care regarding disease state education, salt/fluid restriction, sliding scale diuretics, and medication compliance.

## 2018-12-07 ENCOUNTER — Encounter (HOSPITAL_COMMUNITY): Payer: Self-pay

## 2018-12-07 ENCOUNTER — Other Ambulatory Visit: Payer: Self-pay

## 2018-12-07 ENCOUNTER — Ambulatory Visit (HOSPITAL_COMMUNITY)
Admission: RE | Admit: 2018-12-07 | Discharge: 2018-12-07 | Disposition: A | Discharge status: Home / Self Care | Payer: Medicare HMO | Source: Ambulatory Visit | Attending: Cardiology | Admitting: Cardiology

## 2018-12-07 VITALS — BP 142/90 | HR 75 | Wt 397.4 lb

## 2018-12-07 DIAGNOSIS — G629 Polyneuropathy, unspecified: Secondary | ICD-10-CM | POA: Insufficient documentation

## 2018-12-07 DIAGNOSIS — Z86711 Personal history of pulmonary embolism: Secondary | ICD-10-CM | POA: Insufficient documentation

## 2018-12-07 DIAGNOSIS — I110 Hypertensive heart disease with heart failure: Secondary | ICD-10-CM | POA: Insufficient documentation

## 2018-12-07 DIAGNOSIS — R69 Illness, unspecified: Secondary | ICD-10-CM | POA: Diagnosis not present

## 2018-12-07 DIAGNOSIS — F410 Panic disorder [episodic paroxysmal anxiety] without agoraphobia: Secondary | ICD-10-CM | POA: Diagnosis not present

## 2018-12-07 DIAGNOSIS — F329 Major depressive disorder, single episode, unspecified: Secondary | ICD-10-CM | POA: Diagnosis not present

## 2018-12-07 DIAGNOSIS — I428 Other cardiomyopathies: Secondary | ICD-10-CM | POA: Insufficient documentation

## 2018-12-07 DIAGNOSIS — I5042 Chronic combined systolic (congestive) and diastolic (congestive) heart failure: Secondary | ICD-10-CM

## 2018-12-07 DIAGNOSIS — I10 Essential (primary) hypertension: Secondary | ICD-10-CM | POA: Diagnosis not present

## 2018-12-07 DIAGNOSIS — F17200 Nicotine dependence, unspecified, uncomplicated: Secondary | ICD-10-CM

## 2018-12-07 DIAGNOSIS — F17210 Nicotine dependence, cigarettes, uncomplicated: Secondary | ICD-10-CM | POA: Insufficient documentation

## 2018-12-07 DIAGNOSIS — Z833 Family history of diabetes mellitus: Secondary | ICD-10-CM | POA: Diagnosis not present

## 2018-12-07 DIAGNOSIS — M109 Gout, unspecified: Secondary | ICD-10-CM | POA: Diagnosis not present

## 2018-12-07 DIAGNOSIS — Z888 Allergy status to other drugs, medicaments and biological substances status: Secondary | ICD-10-CM | POA: Insufficient documentation

## 2018-12-07 DIAGNOSIS — E785 Hyperlipidemia, unspecified: Secondary | ICD-10-CM | POA: Insufficient documentation

## 2018-12-07 DIAGNOSIS — R7989 Other specified abnormal findings of blood chemistry: Secondary | ICD-10-CM | POA: Diagnosis not present

## 2018-12-07 DIAGNOSIS — I480 Paroxysmal atrial fibrillation: Secondary | ICD-10-CM | POA: Diagnosis not present

## 2018-12-07 DIAGNOSIS — I5022 Chronic systolic (congestive) heart failure: Secondary | ICD-10-CM | POA: Insufficient documentation

## 2018-12-07 DIAGNOSIS — M1990 Unspecified osteoarthritis, unspecified site: Secondary | ICD-10-CM | POA: Diagnosis not present

## 2018-12-07 DIAGNOSIS — Z794 Long term (current) use of insulin: Secondary | ICD-10-CM | POA: Diagnosis not present

## 2018-12-07 DIAGNOSIS — G4733 Obstructive sleep apnea (adult) (pediatric): Secondary | ICD-10-CM | POA: Diagnosis not present

## 2018-12-07 DIAGNOSIS — Z79899 Other long term (current) drug therapy: Secondary | ICD-10-CM | POA: Diagnosis not present

## 2018-12-07 DIAGNOSIS — F419 Anxiety disorder, unspecified: Secondary | ICD-10-CM | POA: Diagnosis not present

## 2018-12-07 DIAGNOSIS — Z7901 Long term (current) use of anticoagulants: Secondary | ICD-10-CM | POA: Insufficient documentation

## 2018-12-07 DIAGNOSIS — Z8249 Family history of ischemic heart disease and other diseases of the circulatory system: Secondary | ICD-10-CM | POA: Insufficient documentation

## 2018-12-07 LAB — BASIC METABOLIC PANEL
Anion gap: 8 (ref 5–15)
BUN: 8 mg/dL (ref 6–20)
CALCIUM: 9.2 mg/dL (ref 8.9–10.3)
CO2: 26 mmol/L (ref 22–32)
Chloride: 104 mmol/L (ref 98–111)
Creatinine, Ser: 1.13 mg/dL (ref 0.61–1.24)
GFR calc Af Amer: >60 mL/min (ref >60)
GFR calc non Af Amer: >60 mL/min (ref >60)
Glucose, Bld: 125 mg/dL — ABNORMAL HIGH (ref 70–99)
Potassium: 3.5 mmol/L (ref 3.5–5.1)
Sodium: 138 mmol/L (ref 135–145)

## 2018-12-07 LAB — LIPID PANEL
CHOLESTEROL: 193 mg/dL (ref 0–200)
HDL: 28 mg/dL — ABNORMAL LOW (ref >40)
LDL Cholesterol: 128 mg/dL — ABNORMAL HIGH (ref 0–99)
Total CHOL/HDL Ratio: 6.9 RATIO
Triglycerides: 185 mg/dL — ABNORMAL HIGH (ref <150)
VLDL: 37 mg/dL (ref 0–40)

## 2018-12-07 LAB — MAGNESIUM: Magnesium: 1.9 mg/dL (ref 1.7–2.4)

## 2018-12-07 NOTE — Patient Instructions (Signed)
Labs done today  Follow up with Dr. McLean in 3 months 

## 2018-12-16 ENCOUNTER — Encounter: Payer: Self-pay | Admitting: Internal Medicine

## 2018-12-16 ENCOUNTER — Other Ambulatory Visit (HOSPITAL_COMMUNITY): Payer: Self-pay | Admitting: Adult Health

## 2018-12-16 ENCOUNTER — Ambulatory Visit (INDEPENDENT_AMBULATORY_CARE_PROVIDER_SITE_OTHER): Payer: Medicare HMO | Admitting: Internal Medicine

## 2018-12-16 VITALS — BP 128/70 | HR 78 | Temp 98.3°F | Ht 68.0 in | Wt 384.5 lb

## 2018-12-16 DIAGNOSIS — R69 Illness, unspecified: Secondary | ICD-10-CM | POA: Diagnosis not present

## 2018-12-16 DIAGNOSIS — F333 Major depressive disorder, recurrent, severe with psychotic symptoms: Secondary | ICD-10-CM | POA: Insufficient documentation

## 2018-12-16 DIAGNOSIS — M170 Bilateral primary osteoarthritis of knee: Secondary | ICD-10-CM

## 2018-12-16 DIAGNOSIS — F319 Bipolar disorder, unspecified: Secondary | ICD-10-CM | POA: Diagnosis not present

## 2018-12-16 DIAGNOSIS — E1140 Type 2 diabetes mellitus with diabetic neuropathy, unspecified: Secondary | ICD-10-CM

## 2018-12-16 MED ORDER — DULOXETINE HCL 30 MG PO CPEP: 30.0000 mg | ORAL_CAPSULE | Freq: Every day | ORAL | 0 refills | Status: DC

## 2018-12-16 MED ORDER — CARIPRAZINE HCL 1.5 MG PO CAPS: 1.5000 mg | ORAL_CAPSULE | Freq: Every day | ORAL | 0 refills | Status: DC

## 2018-12-16 NOTE — Progress Notes (Signed)
 Subjective:  Patient ID: [name], male    DOB: 05/24/1966  Age: 53 y.o. MRN: [id number]  CC: Depression   HPI Jourden L Hedlund presents for f/up - He complains of worsening signs and symptoms of depression with anhedonia, ruminations, weight loss, insomnia, anxiety, and loss of appetite.  He denies SI or HI.  He was previously treated for bipolar type I.  He did not get much symptom relief with BuSpar, Lexapro, or Zoloft.  Outpatient Medications Prior to Visit  Medication Sig Dispense Refill  . atorvastatin (LIPITOR) 40 MG tablet Take 1 tablet (40 mg total) by mouth daily. 90 tablet 1  . b complex vitamins tablet Take 1 tablet by mouth daily at 12 noon.    . blood glucose meter kit and supplies KIT Use to check blood sugar. DX E11.9 1 each 0  . insulin aspart (NOVOLOG FLEXPEN) 100 UNIT/ML FlexPen 0-15 Units, Subcutaneous, 3 times daily with meals CBG < 70: implement hypoglycemia protocol-call MD CBG 70 - 120: 0 units CBG 121 - 150: 2 units CBG 151 - 200: 3 units CBG 201 - 250: 5 units CBG 251 - 300: 8 units CBG 301 - 350: 11 units CBG 351 - 400: 15 units CBG > 400: 15 mL 0  . Insulin Pen Needle 32G X 8 MM MISC Use as directed 100 each 0  . losartan (COZAAR) 50 MG tablet Take 1 tablet (50 mg total) by mouth daily. 90 tablet 3  . magnesium oxide (MAG-OX) 400 MG tablet Take 400 mg by mouth daily as needed (leg cramps).     . metoprolol succinate (TOPROL-XL) 100 MG 24 hr tablet Take 1 tab in the morning and take 0.5 tab in the evening 45 tablet 3  . Misc Natural Products (TART CHERRY ADVANCED) CAPS Take 2 capsules by mouth daily at 12 noon. 1 capsule - 1200 mg    . Omega-3 Fatty Acids (FISH OIL) 1000 MG CAPS Take 1,000 mg by mouth daily at 12 noon.    . pantoprazole (PROTONIX) 40 MG tablet TAKE 1 TABLET (40 MG TOTAL) BY MOUTH DAILY. 90 tablet 3  . PRESCRIPTION MEDICATION Inhale into the lungs See admin instructions. Bipap, pressure 16/12 with 2L of O2 - use whenever sleeping    .  rivaroxaban (XARELTO) 20 MG TABS tablet Take 1 tablet (20 mg total) by mouth daily with supper. 90 tablet 1  . spironolactone (ALDACTONE) 25 MG tablet TAKE 1 TABLET BY MOUTH EVERY DAY 90 tablet 3  . thiamine (VITAMIN B-1) 100 MG tablet Take 1 tablet (100 mg total) by mouth daily. 90 tablet 1  . torsemide (DEMADEX) 20 MG tablet Take 20-40 mg by mouth daily at 12 noon.     . traMADol (ULTRAM) 50 MG tablet Take 1 tablet (50 mg total) by mouth every 6 (six) hours as needed. 90 tablet 3  . traZODone (DESYREL) 50 MG tablet Take 1 tablet (50 mg total) by mouth at bedtime. 90 tablet 1  . potassium chloride SA (K-DUR,KLOR-CON) 20 MEQ tablet Take 20 mEq by mouth daily as needed (cramping).     No facility-administered medications prior to visit.     ROS Review of Systems  Constitutional: Positive for appetite change and unexpected weight change. Negative for diaphoresis and fatigue.  HENT: Negative.   Respiratory: Negative.  Negative for cough, chest tightness, shortness of breath and wheezing.   Cardiovascular: Negative for chest pain, palpitations and leg swelling.  Gastrointestinal: Negative for abdominal pain, constipation,   diarrhea, nausea and vomiting.  Endocrine: Negative.   Genitourinary: Negative.  Negative for difficulty urinating.  Musculoskeletal: Positive for arthralgias. Negative for back pain and neck pain.  Skin: Negative.  Negative for color change.  Hematological: Negative for adenopathy. Does not bruise/bleed easily.  Psychiatric/Behavioral: Positive for dysphoric mood and sleep disturbance. Negative for agitation, behavioral problems, confusion, decreased concentration, hallucinations, self-injury and suicidal ideas. The patient is nervous/anxious.     Objective:  BP 128/70 (BP Location: Left Arm, Patient Position: Sitting, Cuff Size: Large)   Pulse 78   Temp 98.3 F (36.8 C) (Oral)   Ht 5' 8" (1.727 m)   Wt (!) 384 lb 8 oz (174.4 kg)   SpO2 96%   BMI 58.46 kg/m   BP  Readings from Last 3 Encounters:  12/16/18 128/70  12/07/18 (!) 142/90  11/30/18 128/84    Wt Readings from Last 3 Encounters:  12/16/18 (!) 384 lb 8 oz (174.4 kg)  12/07/18 (!) 397 lb 6 oz (180.2 kg)  11/30/18 (!) 398 lb (180.5 kg)    Physical Exam Vitals signs reviewed.  Constitutional:      Appearance: He is obese. He is not ill-appearing or diaphoretic.  HENT:     Nose: Nose normal. No congestion or rhinorrhea.     Mouth/Throat:     Mouth: Mucous membranes are moist.     Pharynx: Oropharynx is clear. No oropharyngeal exudate or posterior oropharyngeal erythema.  Eyes:     General: No scleral icterus.    Conjunctiva/sclera: Conjunctivae normal.  Neck:     Musculoskeletal: Normal range of motion and neck supple. No muscular tenderness.  Cardiovascular:     Rate and Rhythm: Normal rate and regular rhythm.     Heart sounds: No murmur. No gallop.   Pulmonary:     Breath sounds: No stridor. No wheezing, rhonchi or rales.  Abdominal:     General: Abdomen is flat. Bowel sounds are normal.     Palpations: There is no hepatomegaly, splenomegaly or mass.     Tenderness: There is no abdominal tenderness. There is no guarding.     Hernia: No hernia is present.  Musculoskeletal: Normal range of motion.        General: No swelling.     Right lower leg: No edema.     Left lower leg: No edema.  Lymphadenopathy:     Cervical: No cervical adenopathy.  Skin:    General: Skin is warm and dry.     Coloration: Skin is not pale.     Findings: No rash.  Neurological:     General: No focal deficit present.     Mental Status: He is oriented to person, place, and time. Mental status is at baseline.  Psychiatric:        Attention and Perception: Attention normal.        Mood and Affect: Mood is depressed. Mood is not anxious or elated. Affect is angry and tearful. Affect is not labile, flat or inappropriate.        Speech: Speech is delayed. Speech is not rapid and pressured, slurred or  tangential.        Behavior: Behavior normal. Behavior is not agitated, slowed, aggressive, withdrawn, hyperactive or combative. Behavior is cooperative.        Thought Content: Thought content normal. Thought content is not paranoid or delusional. Thought content does not include homicidal or suicidal ideation. Thought content does not include homicidal or suicidal plan.          Cognition and Memory: Cognition and memory normal.        Judgment: Judgment normal.     Lab Results  Component Value Date   WBC 11.1 (H) 11/21/2018   HGB 14.4 11/21/2018   HCT 41.9 11/21/2018   PLT 244 11/21/2018   GLUCOSE 125 (H) 12/07/2018   CHOL 193 12/07/2018   TRIG 185 (H) 12/07/2018   HDL 28 (L) 12/07/2018   LDLDIRECT 166.0 09/22/2018   LDLCALC 128 (H) 12/07/2018   ALT 26 11/20/2018   AST 18 11/20/2018   NA 138 12/07/2018   K 3.5 12/07/2018   CL 104 12/07/2018   CREATININE 1.13 12/07/2018   BUN 8 12/07/2018   CO2 26 12/07/2018   TSH 1.89 02/02/2018   PSA 0.38 04/21/2018   INR 1.0 09/22/2018   HGBA1C 6.6 (H) 11/21/2018   MICROALBUR 0.8 02/02/2018    No results found.  Assessment & Plan:   Gumecindo was seen today for depression.  Diagnoses and all orders for this visit:  Severe episode of recurrent major depressive disorder, with psychotic features ([place])- Will start Cymbalta at 30 mg a day.  I anticipate increasing the dose to 60 mg a day in the next 3 to 4 weeks. -     DULoxetine (CYMBALTA) 30 MG capsule; Take 1 capsule (30 mg total) by mouth daily.  Bipolar 1 disorder, depressed (HCC) -     cariprazine (VRAYLAR) capsule; Take 1 capsule (1.5 mg total) by mouth daily.  Primary osteoarthritis of both knees- Will start Cymbalta at 30 mg a day.  The therapeutic dose for this is 60 mg a day so after couple of weeks I anticipate increasing the dose to 60 mg a day. -     DULoxetine (CYMBALTA) 30 MG capsule; Take 1 capsule (30 mg total) by mouth daily.  Diabetic neuropathy, painful ([place])- As  above -     DULoxetine (CYMBALTA) 30 MG capsule; Take 1 capsule (30 mg total) by mouth daily.   I am having Alif L. Altieri start on cariprazine and DULoxetine. I am also having him maintain his magnesium oxide, pantoprazole, thiamine, blood glucose meter kit and supplies, torsemide, spironolactone, atorvastatin, traZODone, metoprolol succinate, Fish Oil, PRESCRIPTION MEDICATION, b complex vitamins, TART CHERRY ADVANCED, losartan, Insulin Pen Needle, insulin aspart, traMADol, and rivaroxaban.  Meds ordered this encounter  Medications  . cariprazine (VRAYLAR) capsule    Sig: Take 1 capsule (1.5 mg total) by mouth daily.    Dispense:  105 capsule    Refill:  0  . DULoxetine (CYMBALTA) 30 MG capsule    Sig: Take 1 capsule (30 mg total) by mouth daily.    Dispense:  30 capsule    Refill:  0     Follow-up: Return in about 4 weeks (around 01/13/2019).  Scarlette Calico, MD

## 2018-12-16 NOTE — Patient Instructions (Signed)
Major Depressive Disorder, Adult  Major depressive disorder (MDD) is a mental health condition. It may also be called clinical depression or unipolar depression. MDD usually causes feelings of sadness, hopelessness, or helplessness. MDD can also cause physical symptoms. It can interfere with work, school, relationships, and other everyday activities. MDD may be mild, moderate, or severe. It may occur once (single episode major depressive disorder) or it may occur multiple times (recurrent major depressive disorder).  What are the causes?  The exact cause of this condition is not known. MDD is most likely caused by a combination of things, which may include:   Genetic factors. These are traits that are passed along from parent to child.   Individual factors. Your personality, your behavior, and the way you handle your thoughts and feelings may contribute to MDD. This includes personality traits and behaviors learned from others.   Physical factors, such as:  ? Differences in the part of your brain that controls emotion. This part of your brain may be different than it is in people who do not have MDD.  ? Long-term (chronic) medical or psychiatric illnesses.   Social factors. Traumatic experiences or major life changes may play a role in the development of MDD.  What increases the risk?  This condition is more likely to develop in women. The following factors may also make you more likely to develop MDD:   A family history of depression.   Troubled family relationships.   Abnormally low levels of certain brain chemicals.   Traumatic events in childhood, especially abuse or the loss of a parent.   Being under a lot of stress, or long-term stress, especially from upsetting life experiences or losses.   A history of:  ? Chronic physical illness.  ? Other mental health disorders.  ? Substance abuse.   Poor living conditions.   Experiencing social exclusion or discrimination on a regular basis.  What are the  signs or symptoms?  The main symptoms of MDD typically include:   Constant depressed or irritable mood.   Loss of interest in things and activities.  MDD symptoms may also include:   Sleeping or eating too much or too little.   Unexplained weight change.   Fatigue or low energy.   Feelings of worthlessness or guilt.   Difficulty thinking clearly or making decisions.   Thoughts of suicide or of harming others.   Physical agitation or weakness.   Isolation.  Severe cases of MDD may also occur with other symptoms, such as:   Delusions or hallucinations, in which you imagine things that are not real (psychotic depression).   Low-level depression that lasts at least a year (chronic depression or persistent depressive disorder).   Extreme sadness and hopelessness (melancholic depression).   Trouble speaking and moving (catatonic depression).  How is this diagnosed?  This condition may be diagnosed based on:   Your symptoms.   Your medical history, including your mental health history. This may involve tests to evaluate your mental health. You may be asked questions about your lifestyle, including any drug and alcohol use, and how long you have had symptoms of MDD.   A physical exam.   Blood tests to rule out other conditions.  You must have a depressed mood and at least four other MDD symptoms most of the day, nearly every day in the same 2-week timeframe before your health care provider can confirm a diagnosis of MDD.  How is this treated?  This condition is   usually treated by mental health professionals, such as psychologists, psychiatrists, and clinical social workers. You may need more than one type of treatment. Treatment may include:   Psychotherapy. This is also called talk therapy or counseling. Types of psychotherapy include:  ? Cognitive behavioral therapy (CBT). This type of therapy teaches you to recognize unhealthy feelings, thoughts, and behaviors, and replace them with positive thoughts  and actions.  ? Interpersonal therapy (IPT). This helps you to improve the way you relate to and communicate with others.  ? Family therapy. This treatment includes members of your family.   Medicine to treat anxiety and depression, or to help you control certain emotions and behaviors.   Lifestyle changes, such as:  ? Limiting alcohol and drug use.  ? Exercising regularly.  ? Getting plenty of sleep.  ? Making healthy eating choices.  ? Spending more time outdoors.    Treatments involving stimulation of the brain can be used in situations with extremely severe symptoms, or when medicine or other therapies do not work over time. These treatments include electroconvulsive therapy, transcranial magnetic stimulation, and vagal nerve stimulation.  Follow these instructions at home:  Activity   Return to your normal activities as told by your health care provider.   Exercise regularly and spend time outdoors as told by your health care provider.  General instructions   Take over-the-counter and prescription medicines only as told by your health care provider.   Do not drink alcohol. If you drink alcohol, limit your alcohol intake to no more than 1 drink a day for nonpregnant women and 2 drinks a day for men. One drink equals 12 oz of beer, 5 oz of wine, or 1 oz of hard liquor. Alcohol can affect any antidepressant medicines you are taking. Talk to your health care provider about your alcohol use.   Eat a healthy diet and get plenty of sleep.   Find activities that you enjoy doing, and make time to do them.   Consider joining a support group. Your health care provider may be able to recommend a support group.   Keep all follow-up visits as told by your health care provider. This is important.  Where to find more information  National Alliance on Mental Illness   www.nami.org  [place] National Institute of Mental Health   www.nimh.nih.gov  National Suicide Prevention Lifeline   1-800-273-TALK (8255). This is  free, 24-hour help.  Contact a health care provider if:   Your symptoms get worse.   You develop new symptoms.  Get help right away if:   You self-harm.   You have serious thoughts about hurting yourself or others.   You see, hear, taste, smell, or feel things that are not present (hallucinate).  This information is not intended to replace advice given to you by your health care provider. Make sure you discuss any questions you have with your health care provider.  Document Released: 03/14/2013 Document Revised: 07/24/2016 Document Reviewed: 05/28/2016  Elsevier Interactive Patient Education  2019 [employer] Inc[employer]

## 2018-12-17 ENCOUNTER — Other Ambulatory Visit: Payer: Self-pay | Admitting: Internal Medicine

## 2018-12-17 DIAGNOSIS — M170 Bilateral primary osteoarthritis of knee: Secondary | ICD-10-CM

## 2018-12-17 DIAGNOSIS — E1140 Type 2 diabetes mellitus with diabetic neuropathy, unspecified: Secondary | ICD-10-CM

## 2018-12-17 DIAGNOSIS — F333 Major depressive disorder, recurrent, severe with psychotic symptoms: Secondary | ICD-10-CM

## 2018-12-21 ENCOUNTER — Other Ambulatory Visit: Payer: Self-pay | Admitting: Internal Medicine

## 2018-12-21 DIAGNOSIS — E1140 Type 2 diabetes mellitus with diabetic neuropathy, unspecified: Secondary | ICD-10-CM

## 2018-12-21 DIAGNOSIS — F333 Major depressive disorder, recurrent, severe with psychotic symptoms: Secondary | ICD-10-CM

## 2018-12-21 DIAGNOSIS — M170 Bilateral primary osteoarthritis of knee: Secondary | ICD-10-CM

## 2018-12-27 ENCOUNTER — Encounter (HOSPITAL_COMMUNITY): Payer: Self-pay | Admitting: Cardiology

## 2019-01-01 DIAGNOSIS — I10 Essential (primary) hypertension: Secondary | ICD-10-CM | POA: Diagnosis not present

## 2019-01-01 DIAGNOSIS — G4733 Obstructive sleep apnea (adult) (pediatric): Secondary | ICD-10-CM | POA: Diagnosis not present

## 2019-01-01 DIAGNOSIS — I509 Heart failure, unspecified: Secondary | ICD-10-CM | POA: Diagnosis not present

## 2019-01-05 ENCOUNTER — Ambulatory Visit: Payer: Self-pay | Admitting: Gastroenterology

## 2019-01-10 ENCOUNTER — Encounter: Payer: Self-pay | Admitting: Internal Medicine

## 2019-01-12 ENCOUNTER — Telehealth: Payer: Self-pay | Admitting: Internal Medicine

## 2019-01-12 ENCOUNTER — Other Ambulatory Visit (HOSPITAL_COMMUNITY): Payer: Self-pay | Admitting: Adult Health

## 2019-01-12 NOTE — Telephone Encounter (Signed)
Copied from [place] [id number]. Topic: Quick Communication - See Telephone Encounter >> Jan 12, 2019  4:18 PM Sheran Luz wrote: CRM for notification. See Telephone encounter for: 01/12/19.   Cliff from [employer] requesting a call back regarding Urine drugs of abuse scrn w alc, routine  lab from 09-22-18, as it was denied by insurance and they need a diagnosis associated.   CB# [phone] ARE#[id number]

## 2019-01-12 NOTE — Telephone Encounter (Signed)
Refills for this medication should be sent to PCP

## 2019-01-14 ENCOUNTER — Other Ambulatory Visit (HOSPITAL_COMMUNITY): Payer: Self-pay | Admitting: Adult Health

## 2019-01-14 NOTE — Telephone Encounter (Signed)
refills for this med should be sent to PCP

## 2019-01-17 NOTE — Telephone Encounter (Signed)
Called the number and they gave me a different number to call. [phone]  Called the new number - they transferred me to patient billing department.   Spoke to rep and they stated that there is a $0 bill for the date of service and to disregard the message.   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

## 2019-01-18 ENCOUNTER — Telehealth (HOSPITAL_COMMUNITY): Payer: Self-pay

## 2019-01-18 NOTE — Telephone Encounter (Signed)
Left VM for pt 

## 2019-01-18 NOTE — Telephone Encounter (Signed)
Have him hold torsemide and potassium for 2-3 days and see if that improves symptoms. Thanks

## 2019-01-18 NOTE — Telephone Encounter (Signed)
Pt called to report dizziness, weakness, and fluttering for the past week. Pt states that his wt is down 12 lbs due to taking torsemide and K. Pt is currently taking torsemide 20 mg (Take 20-40 mg by mouth daily at 12 noon). Please advise.

## 2019-01-19 ENCOUNTER — Encounter: Payer: Self-pay | Admitting: Internal Medicine

## 2019-01-19 ENCOUNTER — Ambulatory Visit (INDEPENDENT_AMBULATORY_CARE_PROVIDER_SITE_OTHER): Payer: Medicare HMO | Admitting: Internal Medicine

## 2019-01-19 ENCOUNTER — Other Ambulatory Visit (INDEPENDENT_AMBULATORY_CARE_PROVIDER_SITE_OTHER): Payer: Medicare HMO

## 2019-01-19 VITALS — BP 120/80 | HR 70 | Temp 97.8°F | Ht 68.0 in | Wt 395.5 lb

## 2019-01-19 DIAGNOSIS — G5712 Meralgia paresthetica, left lower limb: Secondary | ICD-10-CM | POA: Diagnosis not present

## 2019-01-19 DIAGNOSIS — N182 Chronic kidney disease, stage 2 (mild): Secondary | ICD-10-CM

## 2019-01-19 DIAGNOSIS — E118 Type 2 diabetes mellitus with unspecified complications: Secondary | ICD-10-CM | POA: Diagnosis not present

## 2019-01-19 DIAGNOSIS — R69 Illness, unspecified: Secondary | ICD-10-CM | POA: Diagnosis not present

## 2019-01-19 DIAGNOSIS — I10 Essential (primary) hypertension: Secondary | ICD-10-CM | POA: Diagnosis not present

## 2019-01-19 DIAGNOSIS — K219 Gastro-esophageal reflux disease without esophagitis: Secondary | ICD-10-CM | POA: Diagnosis not present

## 2019-01-19 DIAGNOSIS — E785 Hyperlipidemia, unspecified: Secondary | ICD-10-CM

## 2019-01-19 DIAGNOSIS — K760 Fatty (change of) liver, not elsewhere classified: Secondary | ICD-10-CM

## 2019-01-19 DIAGNOSIS — E519 Thiamine deficiency, unspecified: Secondary | ICD-10-CM

## 2019-01-19 DIAGNOSIS — F333 Major depressive disorder, recurrent, severe with psychotic symptoms: Secondary | ICD-10-CM

## 2019-01-19 DIAGNOSIS — E559 Vitamin D deficiency, unspecified: Secondary | ICD-10-CM

## 2019-01-19 LAB — URINALYSIS, ROUTINE W REFLEX MICROSCOPIC
Bilirubin Urine: NEGATIVE
HGB URINE DIPSTICK: NEGATIVE
Ketones, ur: NEGATIVE
NITRITE: NEGATIVE
Specific Gravity, Urine: 1.025 (ref 1.000–1.030)
Total Protein, Urine: NEGATIVE
Urine Glucose: NEGATIVE
Urobilinogen, UA: 1 (ref 0.0–1.0)
pH: 6 (ref 5.0–8.0)

## 2019-01-19 LAB — COMPREHENSIVE METABOLIC PANEL
ALK PHOS: 116 U/L (ref 39–117)
ALT: 25 U/L (ref 0–53)
AST: 14 U/L (ref 0–37)
Albumin: 3.8 g/dL (ref 3.5–5.2)
BUN: 15 mg/dL (ref 6–23)
CO2: 25 mEq/L (ref 19–32)
Calcium: 8.9 mg/dL (ref 8.4–10.5)
Chloride: 104 mEq/L (ref 96–112)
Creatinine, Ser: 1.04 mg/dL (ref 0.40–1.50)
GFR: 91.35 mL/min (ref >60.00)
Glucose, Bld: 92 mg/dL (ref 70–99)
POTASSIUM: 3.7 meq/L (ref 3.5–5.1)
SODIUM: 138 meq/L (ref 135–145)
TOTAL PROTEIN: 7.7 g/dL (ref 6.0–8.3)
Total Bilirubin: 0.5 mg/dL (ref 0.2–1.2)

## 2019-01-19 LAB — CBC WITH DIFFERENTIAL/PLATELET
Basophils Absolute: 0.1 K/uL (ref 0.0–0.1)
Basophils Relative: 1.1 % (ref 0.0–3.0)
Eosinophils Absolute: 0.3 K/uL (ref 0.0–0.7)
Eosinophils Relative: 2.6 % (ref 0.0–5.0)
HCT: 43.9 % (ref 39.0–52.0)
Hemoglobin: 15.2 g/dL (ref 13.0–17.0)
Lymphocytes Relative: 46.6 % — ABNORMAL HIGH (ref 12.0–46.0)
Lymphs Abs: 5.5 K/uL — ABNORMAL HIGH (ref 0.7–4.0)
MCHC: 34.6 g/dL (ref 30.0–36.0)
MCV: 84.6 fl (ref 78.0–100.0)
Monocytes Absolute: 1 K/uL (ref 0.1–1.0)
Monocytes Relative: 8.7 % (ref 3.0–12.0)
Neutro Abs: 4.9 K/uL (ref 1.4–7.7)
Neutrophils Relative %: 41 % — ABNORMAL LOW (ref 43.0–77.0)
Platelets: 266 K/uL (ref 150.0–400.0)
RBC: 5.19 Mil/uL (ref 4.22–5.81)
RDW: 14.7 % (ref 11.5–15.5)
WBC: 11.9 K/uL — ABNORMAL HIGH (ref 4.0–10.5)

## 2019-01-19 LAB — MICROALBUMIN / CREATININE URINE RATIO
Creatinine,U: 212.5 mg/dL
MICROALB UR: 0.9 mg/dL (ref 0.0–1.9)
Microalb Creat Ratio: 0.4 mg/g (ref 0.0–30.0)

## 2019-01-19 LAB — TSH: TSH: 1.54 u[IU]/mL (ref 0.35–4.50)

## 2019-01-19 MED ORDER — DULOXETINE HCL 60 MG PO CPEP: 60.0000 mg | ORAL_CAPSULE | Freq: Every day | ORAL | 1 refills | Status: DC

## 2019-01-19 MED ORDER — PANTOPRAZOLE SODIUM 40 MG PO TBEC: DELAYED_RELEASE_TABLET | ORAL | 1 refills | Status: DC

## 2019-01-19 MED ORDER — BUPROPION HCL 75 MG PO TABS: 75.0000 mg | ORAL_TABLET | Freq: Two times a day (BID) | ORAL | 1 refills | Status: DC

## 2019-01-19 NOTE — Progress Notes (Signed)
Subjective:  Patient ID: [name], male    DOB: 1966-11-03  Age: 53 y.o. MRN: [id number]  CC: Gastroesophageal Reflux; Diabetes; and Depression   HPI [name] presents for f/up - He continues to complain of depression, anhedonia, and feeling helpless and hopeless.  He denies SI or HI.    For several years he has had soreness and numbness on the lateral aspect of his left thigh. He does not experience low back pain.  Outpatient Medications Prior to Visit  Medication Sig Dispense Refill  . b complex vitamins tablet Take 1 tablet by mouth daily at 12 noon.    . blood glucose meter kit and supplies KIT Use to check blood sugar. DX E11.9 1 each 0  . cariprazine (VRAYLAR) capsule Take 1 capsule (1.5 mg total) by mouth daily. 105 capsule 0  . insulin aspart (NOVOLOG FLEXPEN) 100 UNIT/ML FlexPen 0-15 Units, Subcutaneous, 3 times daily with meals CBG < 70: implement hypoglycemia protocol-call MD CBG 70 - 120: 0 units CBG 121 - 150: 2 units CBG 151 - 200: 3 units CBG 201 - 250: 5 units CBG 251 - 300: 8 units CBG 301 - 350: 11 units CBG 351 - 400: 15 units CBG > 400: 15 mL 0  . Insulin Pen Needle 32G X 8 MM MISC Use as directed 100 each 0  . KLOR-CON M20 20 MEQ tablet TAKE 1 TABLET BY MOUTH EVERY DAY 90 tablet 3  . losartan (COZAAR) 50 MG tablet Take 1 tablet (50 mg total) by mouth daily. 90 tablet 3  . magnesium oxide (MAG-OX) 400 MG tablet Take 400 mg by mouth daily as needed (leg cramps).     . metoprolol succinate (TOPROL-XL) 100 MG 24 hr tablet Take 1 tab in the morning and take 0.5 tab in the evening 45 tablet 3  . Omega-3 Fatty Acids (FISH OIL) 1000 MG CAPS Take 1,000 mg by mouth daily at 12 noon.    Marland Kitchen PRESCRIPTION MEDICATION Inhale into the lungs See admin instructions. Bipap, pressure 16/12 with 2L of O2 - use whenever sleeping    . rivaroxaban (XARELTO) 20 MG TABS tablet Take 1 tablet (20 mg total) by mouth daily with supper. 90 tablet 1  . spironolactone (ALDACTONE)  25 MG tablet TAKE 1 TABLET BY MOUTH EVERY DAY 90 tablet 3  . thiamine (VITAMIN B-1) 100 MG tablet Take 1 tablet (100 mg total) by mouth daily. 90 tablet 1  . torsemide (DEMADEX) 20 MG tablet Take 20-40 mg by mouth daily at 12 noon.     . traZODone (DESYREL) 50 MG tablet Take 1 tablet (50 mg total) by mouth at bedtime. 90 tablet 1  . atorvastatin (LIPITOR) 40 MG tablet Take 1 tablet (40 mg total) by mouth daily. 90 tablet 1  . DULoxetine (CYMBALTA) 30 MG capsule Take 1 capsule (30 mg total) by mouth daily. 30 capsule 0  . Misc Natural Products (TART CHERRY ADVANCED) CAPS Take 2 capsules by mouth daily at 12 noon. 1 capsule - 1200 mg    . pantoprazole (PROTONIX) 40 MG tablet TAKE 1 TABLET (40 MG TOTAL) BY MOUTH DAILY. 90 tablet 3  . traMADol (ULTRAM) 50 MG tablet Take 1 tablet (50 mg total) by mouth every 6 (six) hours as needed. 90 tablet 3   No facility-administered medications prior to visit.     ROS Review of Systems  Constitutional: Positive for fatigue and unexpected weight change (wt gain). Negative for appetite change, chills and  diaphoresis.  HENT: Negative.   Eyes: Negative for visual disturbance.  Respiratory: Negative for cough, chest tightness, shortness of breath and wheezing.   Cardiovascular: Negative for chest pain and palpitations.  Gastrointestinal: Negative for abdominal pain, constipation, diarrhea, nausea and vomiting.  Endocrine: Negative for polydipsia, polyphagia and polyuria.  Genitourinary: Negative.  Negative for difficulty urinating, dysuria and hematuria.  Musculoskeletal: Positive for arthralgias. Negative for myalgias.  Skin: Negative for color change, pallor and rash.  Neurological: Negative.  Negative for dizziness, weakness, light-headedness and headaches.  Hematological: Negative for adenopathy. Does not bruise/bleed easily.  Psychiatric/Behavioral: Positive for decreased concentration, dysphoric mood and sleep disturbance. Negative for agitation,  behavioral problems, confusion, hallucinations, self-injury and suicidal ideas. The patient is nervous/anxious. The patient is not hyperactive.     Objective:  BP 120/80 (BP Location: Left Arm, Patient Position: Sitting, Cuff Size: Large)   Pulse 70   Temp 97.8 F (36.6 C) (Oral)   Ht 5' 8"  (1.727 m)   Wt (!) 395 lb 8 oz (179.4 kg)   SpO2 97%   BMI 60.14 kg/m   BP Readings from Last 3 Encounters:  01/19/19 120/80  12/16/18 128/70  12/07/18 (!) 142/90    Wt Readings from Last 3 Encounters:  01/19/19 (!) 395 lb 8 oz (179.4 kg)  12/16/18 (!) 384 lb 8 oz (174.4 kg)  12/07/18 (!) 397 lb 6 oz (180.2 kg)    Physical Exam Vitals signs reviewed.  Constitutional:      Appearance: He is obese. He is not ill-appearing or diaphoretic.  HENT:     Nose: No congestion or rhinorrhea.     Mouth/Throat:     Mouth: Mucous membranes are moist.     Pharynx: Oropharynx is clear. No oropharyngeal exudate.  Eyes:     General: No scleral icterus.    Conjunctiva/sclera: Conjunctivae normal.  Neck:     Musculoskeletal: Normal range of motion and neck supple. No muscular tenderness.  Cardiovascular:     Rate and Rhythm: Normal rate and regular rhythm.     Pulses: Normal pulses.     Heart sounds: No murmur. No gallop.   Pulmonary:     Effort: Pulmonary effort is normal. No respiratory distress.     Breath sounds: No stridor. No wheezing, rhonchi or rales.  Abdominal:     General: Abdomen is flat. Bowel sounds are normal. There is no distension.     Palpations: There is no hepatomegaly, splenomegaly or mass.     Tenderness: There is no abdominal tenderness. There is no guarding.  Musculoskeletal: Normal range of motion.        General: No swelling.     Left upper leg: Normal. He exhibits no tenderness, no bony tenderness, no swelling, no edema, no deformity and no laceration.     Right lower leg: No edema.     Left lower leg: No edema.  Lymphadenopathy:     Cervical: No cervical adenopathy.   Skin:    General: Skin is warm and dry.     Coloration: Skin is not pale.     Findings: No erythema.  Neurological:     General: No focal deficit present.     Mental Status: He is oriented to person, place, and time. Mental status is at baseline.  Psychiatric:        Attention and Perception: Attention normal.        Mood and Affect: Mood is anxious and depressed. Affect is flat.  Speech: Speech normal.        Behavior: Behavior normal. Behavior is not agitated, slowed, aggressive or withdrawn. Behavior is cooperative.        Thought Content: Thought content normal. Thought content is not paranoid or delusional. Thought content does not include homicidal or suicidal ideation.        Cognition and Memory: Cognition normal.        Judgment: Judgment normal.     Lab Results  Component Value Date   WBC 11.9 (H) 01/19/2019   HGB 15.2 01/19/2019   HCT 43.9 01/19/2019   PLT 266.0 01/19/2019   GLUCOSE 92 01/19/2019   CHOL 193 12/07/2018   TRIG 185 (H) 12/07/2018   HDL 28 (L) 12/07/2018   LDLDIRECT 166.0 09/22/2018   LDLCALC 128 (H) 12/07/2018   ALT 25 01/19/2019   AST 14 01/19/2019   NA 138 01/19/2019   K 3.7 01/19/2019   CL 104 01/19/2019   CREATININE 1.04 01/19/2019   BUN 15 01/19/2019   CO2 25 01/19/2019   TSH 1.54 01/19/2019   PSA 0.38 04/21/2018   INR 1.0 09/22/2018   HGBA1C 6.6 (H) 11/21/2018   MICROALBUR 0.9 01/19/2019    No results found.  Assessment & Plan:   Amarion was seen today for gastroesophageal reflux, diabetes and depression.  Diagnoses and all orders for this visit:  Essential hypertension- His blood pressure is adequately well controlled. -     CBC with Differential/Platelet; Future -     TSH; Future -     Vitamin D (25 hydroxy); Future  Fatty liver disease, nonalcoholic - His liver enzymes are normal now. -     Comprehensive metabolic panel; Future  Type II diabetes mellitus with manifestations ([place])- His blood sugars have been well  controlled. -     Comprehensive metabolic panel; Future -     Microalbumin / creatinine urine ratio; Future  CKD (chronic kidney disease), stage II- His renal function is normal now. -     Comprehensive metabolic panel; Future -     Urinalysis, Routine w reflex microscopic; Future  Meralgia paresthetica of left side-I have asked him to start feet PT for this. -     Ambulatory referral to Physical Therapy  Severe episode of recurrent major depressive disorder, with psychotic features ([place])- I recommended that he increase the dose of duloxetine to 60 mg and to start bupropion. -     Discontinue: DULoxetine (CYMBALTA) 60 MG capsule; Take 1 capsule (60 mg total) by mouth daily. -     Discontinue: buPROPion (WELLBUTRIN) 75 MG tablet; Take 1 tablet (75 mg total) by mouth 2 (two) times daily. -     DULoxetine (CYMBALTA) 60 MG capsule; Take 1 capsule (60 mg total) by mouth daily. -     buPROPion (WELLBUTRIN) 75 MG tablet; Take 1 tablet (75 mg total) by mouth 2 (two) times daily.  Thiamin deficiency- I will monitor his thiamine level. -     CBC with Differential/Platelet; Future -     Vitamin B1; Future  Gastroesophageal reflux disease without esophagitis -     pantoprazole (PROTONIX) 40 MG tablet; TAKE 1 TABLET (40 MG TOTAL) BY MOUTH DAILY.  Vitamin D deficiency disease -     Cholecalciferol 1.25 MG (50000 UT) capsule; Take 1 capsule (50,000 Units total) by mouth once a week.  Hyperlipidemia with target LDL less than 100- He has not achieved his LDL goal.  I have asked him to restart the statin. -  atorvastatin (LIPITOR) 40 MG tablet; Take 1 tablet (40 mg total) by mouth daily.   I have discontinued Nygel L. Liskey's TART CHERRY ADVANCED and traMADol. I am also having him start on Cholecalciferol. Additionally, I am having him maintain his magnesium oxide, thiamine, blood glucose meter kit and supplies, torsemide, spironolactone, traZODone, metoprolol succinate, Fish Oil, PRESCRIPTION  MEDICATION, b complex vitamins, losartan, Insulin Pen Needle, insulin aspart, rivaroxaban, KLOR-CON M20, cariprazine, DULoxetine, buPROPion, pantoprazole, and atorvastatin.  Meds ordered this encounter  Medications  . DISCONTD: DULoxetine (CYMBALTA) 60 MG capsule    Sig: Take 1 capsule (60 mg total) by mouth daily.    Dispense:  90 capsule    Refill:  1  . DISCONTD: buPROPion (WELLBUTRIN) 75 MG tablet    Sig: Take 1 tablet (75 mg total) by mouth 2 (two) times daily.    Dispense:  180 tablet    Refill:  1  . DULoxetine (CYMBALTA) 60 MG capsule    Sig: Take 1 capsule (60 mg total) by mouth daily.    Dispense:  90 capsule    Refill:  1  . buPROPion (WELLBUTRIN) 75 MG tablet    Sig: Take 1 tablet (75 mg total) by mouth 2 (two) times daily.    Dispense:  180 tablet    Refill:  1  . pantoprazole (PROTONIX) 40 MG tablet    Sig: TAKE 1 TABLET (40 MG TOTAL) BY MOUTH DAILY.    Dispense:  90 tablet    Refill:  1  . Cholecalciferol 1.25 MG (50000 UT) capsule    Sig: Take 1 capsule (50,000 Units total) by mouth once a week.    Dispense:  12 capsule    Refill:  0  . atorvastatin (LIPITOR) 40 MG tablet    Sig: Take 1 tablet (40 mg total) by mouth daily.    Dispense:  90 tablet    Refill:  1     Follow-up: Return in about 3 months (around 04/19/2019).  Scarlette Calico, MD

## 2019-01-19 NOTE — Patient Instructions (Signed)
Major Depressive Disorder, Adult  Major depressive disorder (MDD) is a mental health condition. It may also be called clinical depression or unipolar depression. MDD usually causes feelings of sadness, hopelessness, or helplessness. MDD can also cause physical symptoms. It can interfere with work, school, relationships, and other everyday activities. MDD may be mild, moderate, or severe. It may occur once (single episode major depressive disorder) or it may occur multiple times (recurrent major depressive disorder).  What are the causes?  The exact cause of this condition is not known. MDD is most likely caused by a combination of things, which may include:   Genetic factors. These are traits that are passed along from parent to child.   Individual factors. Your personality, your behavior, and the way you handle your thoughts and feelings may contribute to MDD. This includes personality traits and behaviors learned from others.   Physical factors, such as:  ? Differences in the part of your brain that controls emotion. This part of your brain may be different than it is in people who do not have MDD.  ? Long-term (chronic) medical or psychiatric illnesses.   Social factors. Traumatic experiences or major life changes may play a role in the development of MDD.  What increases the risk?  This condition is more likely to develop in women. The following factors may also make you more likely to develop MDD:   A family history of depression.   Troubled family relationships.   Abnormally low levels of certain brain chemicals.   Traumatic events in childhood, especially abuse or the loss of a parent.   Being under a lot of stress, or long-term stress, especially from upsetting life experiences or losses.   A history of:  ? Chronic physical illness.  ? Other mental health disorders.  ? Substance abuse.   Poor living conditions.   Experiencing social exclusion or discrimination on a regular basis.  What are the  signs or symptoms?  The main symptoms of MDD typically include:   Constant depressed or irritable mood.   Loss of interest in things and activities.  MDD symptoms may also include:   Sleeping or eating too much or too little.   Unexplained weight change.   Fatigue or low energy.   Feelings of worthlessness or guilt.   Difficulty thinking clearly or making decisions.   Thoughts of suicide or of harming others.   Physical agitation or weakness.   Isolation.  Severe cases of MDD may also occur with other symptoms, such as:   Delusions or hallucinations, in which you imagine things that are not real (psychotic depression).   Low-level depression that lasts at least a year (chronic depression or persistent depressive disorder).   Extreme sadness and hopelessness (melancholic depression).   Trouble speaking and moving (catatonic depression).  How is this diagnosed?  This condition may be diagnosed based on:   Your symptoms.   Your medical history, including your mental health history. This may involve tests to evaluate your mental health. You may be asked questions about your lifestyle, including any drug and alcohol use, and how long you have had symptoms of MDD.   A physical exam.   Blood tests to rule out other conditions.  You must have a depressed mood and at least four other MDD symptoms most of the day, nearly every day in the same 2-week timeframe before your health care provider can confirm a diagnosis of MDD.  How is this treated?  This condition is   usually treated by mental health professionals, such as psychologists, psychiatrists, and clinical social workers. You may need more than one type of treatment. Treatment may include:   Psychotherapy. This is also called talk therapy or counseling. Types of psychotherapy include:  ? Cognitive behavioral therapy (CBT). This type of therapy teaches you to recognize unhealthy feelings, thoughts, and behaviors, and replace them with positive thoughts  and actions.  ? Interpersonal therapy (IPT). This helps you to improve the way you relate to and communicate with others.  ? Family therapy. This treatment includes members of your family.   Medicine to treat anxiety and depression, or to help you control certain emotions and behaviors.   Lifestyle changes, such as:  ? Limiting alcohol and drug use.  ? Exercising regularly.  ? Getting plenty of sleep.  ? Making healthy eating choices.  ? Spending more time outdoors.    Treatments involving stimulation of the brain can be used in situations with extremely severe symptoms, or when medicine or other therapies do not work over time. These treatments include electroconvulsive therapy, transcranial magnetic stimulation, and vagal nerve stimulation.  Follow these instructions at home:  Activity   Return to your normal activities as told by your health care provider.   Exercise regularly and spend time outdoors as told by your health care provider.  General instructions   Take over-the-counter and prescription medicines only as told by your health care provider.   Do not drink alcohol. If you drink alcohol, limit your alcohol intake to no more than 1 drink a day for nonpregnant women and 2 drinks a day for men. One drink equals 12 oz of beer, 5 oz of wine, or 1 oz of hard liquor. Alcohol can affect any antidepressant medicines you are taking. Talk to your health care provider about your alcohol use.   Eat a healthy diet and get plenty of sleep.   Find activities that you enjoy doing, and make time to do them.   Consider joining a support group. Your health care provider may be able to recommend a support group.   Keep all follow-up visits as told by your health care provider. This is important.  Where to find more information  National Alliance on Mental Illness   www.nami.org  [place] National Institute of Mental Health   www.nimh.nih.gov  National Suicide Prevention Lifeline   1-800-273-TALK (8255). This is  free, 24-hour help.  Contact a health care provider if:   Your symptoms get worse.   You develop new symptoms.  Get help right away if:   You self-harm.   You have serious thoughts about hurting yourself or others.   You see, hear, taste, smell, or feel things that are not present (hallucinate).  This information is not intended to replace advice given to you by your health care provider. Make sure you discuss any questions you have with your health care provider.  Document Released: 03/14/2013 Document Revised: 07/24/2016 Document Reviewed: 05/28/2016  Elsevier Interactive Patient Education  2019 [employer] Inc[employer]

## 2019-01-19 NOTE — Progress Notes (Unsigned)
vit

## 2019-01-20 ENCOUNTER — Encounter: Payer: Self-pay | Admitting: Internal Medicine

## 2019-01-20 DIAGNOSIS — E559 Vitamin D deficiency, unspecified: Secondary | ICD-10-CM | POA: Insufficient documentation

## 2019-01-20 LAB — VITAMIN D 25 HYDROXY (VIT D DEFICIENCY, FRACTURES): VITD: 9.05 ng/mL — ABNORMAL LOW (ref 30.00–100.00)

## 2019-01-20 MED ORDER — ATORVASTATIN CALCIUM 40 MG PO TABS: 40.0000 mg | ORAL_TABLET | Freq: Every day | ORAL | 1 refills | Status: DC

## 2019-01-20 MED ORDER — CHOLECALCIFEROL 1.25 MG (50000 UT) PO CAPS: 50000.0000 [IU] | ORAL_CAPSULE | ORAL | 0 refills | Status: DC

## 2019-01-22 LAB — VITAMIN B1: Vitamin B1 (Thiamine): 38 nmol/L — ABNORMAL HIGH (ref 8–30)

## 2019-01-23 ENCOUNTER — Encounter: Payer: Self-pay | Admitting: Internal Medicine

## 2019-01-26 DIAGNOSIS — E119 Type 2 diabetes mellitus without complications: Secondary | ICD-10-CM | POA: Diagnosis not present

## 2019-01-26 LAB — HM DIABETES EYE EXAM

## 2019-01-27 ENCOUNTER — Encounter: Payer: Self-pay | Admitting: Internal Medicine

## 2019-01-30 DIAGNOSIS — I10 Essential (primary) hypertension: Secondary | ICD-10-CM | POA: Diagnosis not present

## 2019-01-30 DIAGNOSIS — I509 Heart failure, unspecified: Secondary | ICD-10-CM | POA: Diagnosis not present

## 2019-01-30 DIAGNOSIS — G4733 Obstructive sleep apnea (adult) (pediatric): Secondary | ICD-10-CM | POA: Diagnosis not present

## 2019-01-31 ENCOUNTER — Ambulatory Visit: Payer: Medicare HMO | Attending: Internal Medicine

## 2019-02-17 ENCOUNTER — Ambulatory Visit: Payer: Medicare HMO

## 2019-02-17 ENCOUNTER — Ambulatory Visit: Payer: Self-pay

## 2019-02-17 NOTE — Telephone Encounter (Signed)
Pt c/o large boil to right groin and pt stated that it spreading to the midline. Pt stated that it sore to touch. No fever. Pt noticed boil last week and stated it has grown larger in size.  Pt stated he has had boils before. No drainage noted. Care advice given and pt verbalized understanding. Pt given appt tomorrow morning with Jodi Mourning NP. Reason for Disposition . Boil > 2 inches across (> 5 cm; larger than a golf ball or ping pong ball)  Answer Assessment - Initial Assessment Questions 1. APPEARANCE of BOIL: "What does the boil look like?"      Raised boil  size  of cigarette pack to right groin and spreading closer to midline 2. LOCATION: "Where is the boil located?"  Right groin 3. NUMBER: "How many boils are there?"    1  4. SIZE: "How big is the boil?" (e.g., inches, cm; compare to size of a coin or other object)   Size of cigarette pack 5. ONSET: "When did the boil start?"  started last week 6. PAIN: "Is there any pain?" If so, ask: "How bad is the pain?"   (Scale 1-10; or mild, moderate, severe)   Soreness to touch and feels tight-mild only to touch 7. FEVER: "Do you have a fever?" If so, ask: "What is it, how was it measured, and when did it start?"      no 8. SOURCE: "Have you been around anyone with boils or other Staph infections?" "Have you ever had boils before?"   No-yes 9. OTHER SYMPTOMS: "Do you have any other symptoms?" (e.g., shaking chills, weakness, rash elsewhere on body)    no 10. PREGNANCY: "Is there any chance you are pregnant?" "When was your last menstrual period?" no  Protocols used: BOIL (SKIN ABSCESS)-A-AH

## 2019-02-18 ENCOUNTER — Encounter: Payer: Self-pay | Admitting: Family

## 2019-02-18 ENCOUNTER — Other Ambulatory Visit: Payer: Self-pay

## 2019-02-18 ENCOUNTER — Ambulatory Visit (INDEPENDENT_AMBULATORY_CARE_PROVIDER_SITE_OTHER): Payer: Medicare HMO | Admitting: Family

## 2019-02-18 VITALS — BP 122/80 | HR 80 | Temp 98.3°F | Ht 68.0 in | Wt >= 6400 oz

## 2019-02-18 DIAGNOSIS — I5042 Chronic combined systolic (congestive) and diastolic (congestive) heart failure: Secondary | ICD-10-CM

## 2019-02-18 DIAGNOSIS — L0292 Furuncle, unspecified: Secondary | ICD-10-CM

## 2019-02-18 DIAGNOSIS — I2699 Other pulmonary embolism without acute cor pulmonale: Secondary | ICD-10-CM

## 2019-02-18 MED ORDER — DOXYCYCLINE HYCLATE 100 MG PO TABS: 100.0000 mg | ORAL_TABLET | Freq: Two times a day (BID) | ORAL | 0 refills | Status: DC

## 2019-02-18 NOTE — Progress Notes (Signed)
[name] is a 53 y.o. male with the following history as recorded in EpicCare:  Patient Active Problem List   Diagnosis Date Noted  . Vitamin D deficiency disease 01/20/2019  . Meralgia paresthetica of left side 01/19/2019  . Severe episode of recurrent major depressive disorder, with psychotic features ([place]) 12/16/2018  . Bipolar 1 disorder, depressed ([place]) 12/16/2018  . Osteoarthritis of both ankles and feet 11/30/2018  . Diabetic neuropathy, painful ([place]) 11/30/2018  . Fatty liver disease, nonalcoholic 35/46/5681  . Thiamin deficiency 04/29/2018  . Cholelithiasis 04/22/2018  . CKD (chronic kidney disease), stage II 04/22/2018  . Hyperbilirubinemia   . Routine general medical examination at a health care facility 02/03/2018  . PE (pulmonary thromboembolism) ([place]) 04/12/2017  . Hereditary and idiopathic peripheral neuropathy 07/16/2016  . AF (paroxysmal atrial fibrillation) ([place])   . Osteoarthritis of both knees 12/27/2014  . NSVT (nonsustained ventricular tachycardia) ([place]) 09/26/2013  . Alcohol abuse 08/10/2013  . Hypertriglyceridemia 06/29/2013  . Nonischemic cardiomyopathy ([place]) 01/26/2013  . Type II diabetes mellitus with manifestations ([place]) 04/13/2012  . Tobacco user 02/18/2012  . ED (erectile dysfunction) 07/11/2011  . Morbid obesity ([place]) 06/11/2010  . Essential hypertension 06/11/2010  . Chronic combined systolic and diastolic CHF (congestive heart failure) ([place]) 06/11/2010  . Esophageal reflux 06/11/2010  . OSA (obstructive sleep apnea) with BiPap and oxygen 06/11/2010    Current Outpatient Medications  Medication Sig Dispense Refill  . atorvastatin (LIPITOR) 40 MG tablet Take 1 tablet (40 mg total) by mouth daily. 90 tablet 1  . b complex vitamins tablet Take 1 tablet by mouth daily at 12 noon.    . blood glucose meter kit and supplies KIT Use to check blood sugar. DX E11.9 1 each 0  . buPROPion (WELLBUTRIN) 75 MG tablet Take 1 tablet (75 mg total) by mouth 2  (two) times daily. 180 tablet 1  . cariprazine (VRAYLAR) capsule Take 1 capsule (1.5 mg total) by mouth daily. 105 capsule 0  . Cholecalciferol 1.25 MG (50000 UT) capsule Take 1 capsule (50,000 Units total) by mouth once a week. 12 capsule 0  . clonazePAM (KLONOPIN) 1 MG tablet     . insulin aspart (NOVOLOG FLEXPEN) 100 UNIT/ML FlexPen 0-15 Units, Subcutaneous, 3 times daily with meals CBG < 70: implement hypoglycemia protocol-call MD CBG 70 - 120: 0 units CBG 121 - 150: 2 units CBG 151 - 200: 3 units CBG 201 - 250: 5 units CBG 251 - 300: 8 units CBG 301 - 350: 11 units CBG 351 - 400: 15 units CBG > 400: 15 mL 0  . Insulin Pen Needle 32G X 8 MM MISC Use as directed 100 each 0  . KLOR-CON M20 20 MEQ tablet TAKE 1 TABLET BY MOUTH EVERY DAY 90 tablet 3  . losartan (COZAAR) 50 MG tablet Take 1 tablet (50 mg total) by mouth daily. 90 tablet 3  . magnesium oxide (MAG-OX) 400 MG tablet Take 400 mg by mouth daily as needed (leg cramps).     . metoprolol succinate (TOPROL-XL) 100 MG 24 hr tablet Take 1 tab in the morning and take 0.5 tab in the evening 45 tablet 3  . Omega-3 Fatty Acids (FISH OIL) 1000 MG CAPS Take 1,000 mg by mouth daily at 12 noon.    . pantoprazole (PROTONIX) 40 MG tablet TAKE 1 TABLET (40 MG TOTAL) BY MOUTH DAILY. 90 tablet 1  . PRESCRIPTION MEDICATION Inhale into the lungs See admin instructions. Bipap, pressure 16/12 with 2L of  O2 - use whenever sleeping    . rivaroxaban (XARELTO) 20 MG TABS tablet Take 1 tablet (20 mg total) by mouth daily with supper. 90 tablet 1  . spironolactone (ALDACTONE) 25 MG tablet TAKE 1 TABLET BY MOUTH EVERY DAY 90 tablet 3  . thiamine (VITAMIN B-1) 100 MG tablet Take 1 tablet (100 mg total) by mouth daily. 90 tablet 1  . torsemide (DEMADEX) 20 MG tablet Take 20-40 mg by mouth daily at 12 noon.     . traZODone (DESYREL) 50 MG tablet Take 1 tablet (50 mg total) by mouth at bedtime. 90 tablet 1  . doxycycline (VIBRA-TABS) 100 MG tablet Take 1 tablet  (100 mg total) by mouth 2 (two) times daily. 20 tablet 0   No current facility-administered medications for this visit.     Allergies: Ace inhibitors and Buspirone  Past Medical History:  Diagnosis Date  . Alcohol abuse   . Anxiety state, unspecified   . Atrial fibrillation ([place])   . CHF (congestive heart failure) ([place])   . Chronic systolic heart failure ([place])   . Diabetes mellitus, type II ([place])   . Edema   . Gout   . History of medication noncompliance   . Migraine   . Obesity, unspecified   . Obstructive sleep apnea   . Psychiatric disorder   . Pulmonary embolism ([place])   . Shortness of breath     Past Surgical History:  Procedure Laterality Date  . CARDIAC CATHETERIZATION    . CARDIAC CATHETERIZATION N/A 08/13/2015   Procedure: Right/Left Heart Cath and Coronary Angiography;  Surgeon: [name], MD;  Location: [facility];  Service: Cardiovascular;  Laterality: N/A;  . RIGHT/LEFT HEART CATH AND CORONARY ANGIOGRAPHY N/A 04/14/2017   Procedure: Right/Left Heart Cath and Coronary Angiography;  Surgeon: [name], MD;  Location: [facility];  Service: Cardiovascular;  Laterality: N/A;  . TESTICLE SURGERY      Family History  Problem Relation Age of Onset  . Cancer Mother        brain tumor  . Hypertension Mother   . Diabetes Father        Deceased, 27  . Heart disease Maternal Grandmother   . Hypertension Other        Family History  . Stroke Other        Family History  . Diabetes Other        Family History  . Diabetes Daughter     Social History   Tobacco Use  . Smoking status: Current Every Day Smoker    Packs/day: 0.50    Years: 30.00    Pack years: 15.00    Types: Cigarettes  . Smokeless tobacco: Never Used  . Tobacco comment: Took information today to call for help with support to quit   Substance Use Topics  . Alcohol use: Not Currently    Alcohol/week: 0.0 standard drinks    Comment: Reports none in over a month, trying to  quit     Subjective:  Patient presents with concerns for suspected boil in right lower abdomen x 1 week; no fever, no drainage; has not been apply moist heat to the area; Also concerned about his weight today- feels like he is carrying extra fluid; admits not taking Metoprolol as prescribed; has only been taking 1 Demadex daily recently- has the option to take 2/ day;  Requesting samples of Xarelto if possible- cost is problematic at this time;   Objective:  Vitals:   02/18/19 0939  BP: 122/80  Pulse: 80  Temp: 98.3 F (36.8 C)  TempSrc: Oral  SpO2: 95%  Weight: (!) 409 lb (185.5 kg)  Height: 5' 8" (1.727 m)    General: Well developed, well nourished, in no acute distress  Skin : Warm and dry. Localized area of erythema in right lower abdomen c/w boil Head: Normocephalic and atraumatic  Eyes: Sclera and conjunctiva clear; pupils round and reactive to light; extraocular movements intact  Ears: External normal; canals clear; tympanic membranes normal  Oropharynx: Pink, supple. No suspicious lesions  Neck: Supple without thyromegaly, adenopathy  Lungs: Respirations unlabored;  Neurologic: Alert and oriented; speech intact; face symmetrical; moves all extremities well; CNII-XII intact without focal deficit   Assessment:  1. Boil   2. Chronic combined systolic and diastolic CHF (congestive heart failure) ([place])   3. PE (pulmonary thromboembolism) ([place])     Plan:  1. Encouraged patient to apply moist heat; Rx for Doxycycline 100 mg bid x 10 days; 2. Encouraged to take medications as prescribed; follow-up with cardiology if weight gain persists; 3. Samples of Xarelto 20 mg given;   No follow-ups on file.  No orders of the defined types were placed in this encounter.   Requested Prescriptions   Signed Prescriptions Disp Refills  . doxycycline (VIBRA-TABS) 100 MG tablet 20 tablet 0    Sig: Take 1 tablet (100 mg total) by mouth 2 (two) times daily.

## 2019-02-24 ENCOUNTER — Telehealth: Payer: Self-pay | Admitting: Internal Medicine

## 2019-02-24 NOTE — Telephone Encounter (Signed)
Copied from [place] [id number]. Topic: Quick Communication - Rx Refill/Question >> Feb 24, 2019  4:44 PM Izola Price, [place] A wrote: Medication: Sildenafil 20mg  (Patient requesting refill on medication. Patient did state that his previous PCP did prescribe this medication.  Has the patient contacted their pharmacy? No    Preferred Pharmacy (with phone number or street name): [facility] [address] [place], [place] [address] Phone: [phone]  Agent: Please be advised that RX refills may take up to 3 business days. We ask that you follow-up with your pharmacy.

## 2019-02-28 ENCOUNTER — Other Ambulatory Visit: Payer: Self-pay | Admitting: Internal Medicine

## 2019-02-28 DIAGNOSIS — N522 Drug-induced erectile dysfunction: Secondary | ICD-10-CM | POA: Insufficient documentation

## 2019-02-28 MED ORDER — SILDENAFIL CITRATE 20 MG PO TABS: 80.0000 mg | ORAL_TABLET | Freq: Every day | ORAL | 5 refills | Status: DC | PRN

## 2019-03-02 DIAGNOSIS — G4733 Obstructive sleep apnea (adult) (pediatric): Secondary | ICD-10-CM | POA: Diagnosis not present

## 2019-03-02 DIAGNOSIS — I509 Heart failure, unspecified: Secondary | ICD-10-CM | POA: Diagnosis not present

## 2019-03-02 DIAGNOSIS — I10 Essential (primary) hypertension: Secondary | ICD-10-CM | POA: Diagnosis not present

## 2019-03-03 ENCOUNTER — Encounter (HOSPITAL_COMMUNITY): Payer: Self-pay

## 2019-03-03 ENCOUNTER — Other Ambulatory Visit (HOSPITAL_COMMUNITY): Payer: Self-pay | Admitting: *Deleted

## 2019-03-03 DIAGNOSIS — N522 Drug-induced erectile dysfunction: Secondary | ICD-10-CM

## 2019-03-03 MED ORDER — SILDENAFIL CITRATE 20 MG PO TABS: 80.0000 mg | ORAL_TABLET | Freq: Every day | ORAL | 5 refills | Status: DC | PRN

## 2019-04-01 DIAGNOSIS — I10 Essential (primary) hypertension: Secondary | ICD-10-CM | POA: Diagnosis not present

## 2019-04-01 DIAGNOSIS — I509 Heart failure, unspecified: Secondary | ICD-10-CM | POA: Diagnosis not present

## 2019-04-01 DIAGNOSIS — G4733 Obstructive sleep apnea (adult) (pediatric): Secondary | ICD-10-CM | POA: Diagnosis not present

## 2019-04-11 ENCOUNTER — Ambulatory Visit: Payer: Self-pay

## 2019-04-11 NOTE — Telephone Encounter (Signed)
Incoming call from Patient with a complain tof having " charley horses"  And swelling in diaphragm area.  Complaint of voiding in different directions.  Stream goes everywhere.  Stomach pain radiates to chest area.  Onset was about 2 weeks ago.  Gradually. Rates it moderate.  Tried Tums  No  relief  Noted. Patient reports that he stays  thirsty.  Reviewed protocol and provided care advice  To Patient.  Patient voiced understanding.  Transferred Patient to [place] off to schedule virtual visit.     Reason for Disposition . [1] MODERATE pain (e.g., interferes with normal activities) AND [2] pain comes and goes (cramps) AND [3] present > 24 hours  (Exception: pain with Vomiting or Diarrhea - see that Guideline)  Answer Assessment - Initial Assessment Questions 1. LOCATION: "Where does it hurt?"      Stomach Pain  2. RADIATION: "Does the pain shoot anywhere else?" (e.g., chest, back)    Above to chest 3. ONSET: "When did the pain begin?" (Minutes, hours or days ago)     2 weeks ago 4. SUDDEN: "Gradual or sudden onset?"     gradual 5. PATTERN "Does the pain come and go, or is it constant?"    - If constant: "Is it getting better, staying the same, or worsening?"      (Note: Constant means the pain never goes away completely; most serious pain is constant and it progresses)     - If intermittent: "How long does it last?" "Do you have pain now?"     (Note: Intermittent means the pain goes away completely between bouts)    Come and goes 6. SEVERITY: "How bad is the pain?"  (e.g., Scale 1-10; mild, moderate, or severe)    - MILD (1-3): doesn't interfere with normal activities, abdomen soft and not tender to touch     - MODERATE (4-7): interferes with normal activities or awakens from sleep, tender to touch     - SEVERE (8-10): excruciating pain, doubled over, unable to do any normal activities       moderate 7. RECURRENT SYMPTOM: "Have you ever had this type of abdominal pain before?" If so, ask: "When  was the last time?" and "What happened that time?"      no 8. CAUSE: "What do you think is causing the abdominal pain?"      9. RELIEVING/AGGRAVATING FACTORS: "What makes it better or worse?" (e.g., movement, antacids, bowel movement)    tums no releif 10. OTHER SYMPTOMS: "Has there been any vomiting, diarrhea, constipation, or urine problems?"       Not urinated full streeam Not a straigt stream   Stay thirsty  Protocols used: ABDOMINAL PAIN - MALE-A-AH

## 2019-04-12 ENCOUNTER — Ambulatory Visit (INDEPENDENT_AMBULATORY_CARE_PROVIDER_SITE_OTHER): Payer: Medicare HMO | Admitting: Internal Medicine

## 2019-04-12 ENCOUNTER — Encounter: Payer: Self-pay | Admitting: Internal Medicine

## 2019-04-12 ENCOUNTER — Ambulatory Visit (INDEPENDENT_AMBULATORY_CARE_PROVIDER_SITE_OTHER)
Admission: RE | Admit: 2019-04-12 | Discharge: 2019-04-12 | Disposition: A | Discharge status: Home / Self Care | Payer: Medicare HMO | Source: Ambulatory Visit | Attending: Internal Medicine | Admitting: Internal Medicine

## 2019-04-12 ENCOUNTER — Other Ambulatory Visit: Payer: Self-pay

## 2019-04-12 ENCOUNTER — Other Ambulatory Visit (INDEPENDENT_AMBULATORY_CARE_PROVIDER_SITE_OTHER): Payer: Medicare HMO

## 2019-04-12 VITALS — BP 116/76 | HR 65 | Temp 98.2°F | Resp 16 | Ht 68.0 in | Wt >= 6400 oz

## 2019-04-12 DIAGNOSIS — R739 Hyperglycemia, unspecified: Secondary | ICD-10-CM

## 2019-04-12 DIAGNOSIS — E559 Vitamin D deficiency, unspecified: Secondary | ICD-10-CM

## 2019-04-12 DIAGNOSIS — R10817 Generalized abdominal tenderness: Secondary | ICD-10-CM | POA: Insufficient documentation

## 2019-04-12 DIAGNOSIS — K760 Fatty (change of) liver, not elsewhere classified: Secondary | ICD-10-CM

## 2019-04-12 DIAGNOSIS — E781 Pure hyperglyceridemia: Secondary | ICD-10-CM | POA: Diagnosis not present

## 2019-04-12 DIAGNOSIS — I5042 Chronic combined systolic (congestive) and diastolic (congestive) heart failure: Secondary | ICD-10-CM

## 2019-04-12 DIAGNOSIS — E118 Type 2 diabetes mellitus with unspecified complications: Secondary | ICD-10-CM

## 2019-04-12 DIAGNOSIS — R1013 Epigastric pain: Secondary | ICD-10-CM | POA: Diagnosis not present

## 2019-04-12 DIAGNOSIS — N182 Chronic kidney disease, stage 2 (mild): Secondary | ICD-10-CM

## 2019-04-12 DIAGNOSIS — I10 Essential (primary) hypertension: Secondary | ICD-10-CM

## 2019-04-12 LAB — COMPREHENSIVE METABOLIC PANEL
ALT: 25 U/L (ref 0–53)
AST: 16 U/L (ref 0–37)
Albumin: 3.9 g/dL (ref 3.5–5.2)
Alkaline Phosphatase: 103 U/L (ref 39–117)
BUN: 24 mg/dL — ABNORMAL HIGH (ref 6–23)
CO2: 27 mEq/L (ref 19–32)
Calcium: 9.1 mg/dL (ref 8.4–10.5)
Chloride: 96 mEq/L (ref 96–112)
Creatinine, Ser: 1.28 mg/dL (ref 0.40–1.50)
GFR: 71.82 mL/min (ref >60.00)
Glucose, Bld: 406 mg/dL — ABNORMAL HIGH (ref 70–99)
Potassium: 3.9 mEq/L (ref 3.5–5.1)
Sodium: 133 mEq/L — ABNORMAL LOW (ref 135–145)
Total Bilirubin: 0.8 mg/dL (ref 0.2–1.2)
Total Protein: 7.9 g/dL (ref 6.0–8.3)

## 2019-04-12 LAB — CBC WITH DIFFERENTIAL/PLATELET
Basophils Absolute: 0.1 10*3/uL (ref 0.0–0.1)
Basophils Relative: 0.8 % (ref 0.0–3.0)
Eosinophils Absolute: 0.2 10*3/uL (ref 0.0–0.7)
Eosinophils Relative: 2.9 % (ref 0.0–5.0)
HCT: 41.6 % (ref 39.0–52.0)
Hemoglobin: 14.8 g/dL (ref 13.0–17.0)
Lymphocytes Relative: 47.4 % — ABNORMAL HIGH (ref 12.0–46.0)
Lymphs Abs: 3.8 10*3/uL (ref 0.7–4.0)
MCHC: 35.6 g/dL (ref 30.0–36.0)
MCV: 84.2 fl (ref 78.0–100.0)
Monocytes Absolute: 0.7 10*3/uL (ref 0.1–1.0)
Monocytes Relative: 8.6 % (ref 3.0–12.0)
Neutro Abs: 3.3 10*3/uL (ref 1.4–7.7)
Neutrophils Relative %: 40.3 % — ABNORMAL LOW (ref 43.0–77.0)
Platelets: 221 10*3/uL (ref 150.0–400.0)
RBC: 4.95 Mil/uL (ref 4.22–5.81)
RDW: 13.6 % (ref 11.5–15.5)
WBC: 8.1 10*3/uL (ref 4.0–10.5)

## 2019-04-12 LAB — URINALYSIS, ROUTINE W REFLEX MICROSCOPIC
Bilirubin Urine: NEGATIVE
Hgb urine dipstick: NEGATIVE
Ketones, ur: NEGATIVE
Leukocytes,Ua: NEGATIVE
Nitrite: NEGATIVE
RBC / HPF: NONE SEEN (ref 0–?)
Specific Gravity, Urine: 1.02 (ref 1.000–1.030)
Total Protein, Urine: NEGATIVE
Urine Glucose: >=1000 — AB
Urobilinogen, UA: 0.2 (ref 0.0–1.0)
pH: 5.5 (ref 5.0–8.0)

## 2019-04-12 LAB — MAGNESIUM: Magnesium: 1.8 mg/dL (ref 1.5–2.5)

## 2019-04-12 LAB — TRIGLYCERIDES: Triglycerides: 750 mg/dL — ABNORMAL HIGH (ref 0.0–149.0)

## 2019-04-12 LAB — PROTIME-INR
INR: 1.1 ratio — ABNORMAL HIGH (ref 0.8–1.0)
Prothrombin Time: 12.5 s (ref 9.6–13.1)

## 2019-04-12 LAB — LIPASE: Lipase: 63 U/L — ABNORMAL HIGH (ref 11.0–59.0)

## 2019-04-12 LAB — VITAMIN D 25 HYDROXY (VIT D DEFICIENCY, FRACTURES): VITD: 48.62 ng/mL (ref 30.00–100.00)

## 2019-04-12 LAB — AMYLASE: Amylase: 28 U/L (ref 27–131)

## 2019-04-12 MED ORDER — ICOSAPENT ETHYL 1 G PO CAPS: 2.0000 | ORAL_CAPSULE | Freq: Two times a day (BID) | ORAL | 1 refills | Status: DC

## 2019-04-12 MED ORDER — CHOLECALCIFEROL 1.25 MG (50000 UT) PO CAPS: 50000.0000 [IU] | ORAL_CAPSULE | ORAL | 0 refills | Status: DC

## 2019-04-12 NOTE — Progress Notes (Signed)
Subjective:  Patient ID: [name], male    DOB: 1966/01/11  Age: 53 y.o. MRN: [id number]  CC: Abdominal Pain   HPI [name] presents for concerns about a 5-day history of abdominal pain primarily in his left upper quadrant but he has also had some discomfort in his epigastrium and left lower quadrant.  He describes it as a burning and spasm sensation with a slight decrease in his appetite.  He denies nausea, vomiting, fever, chills, diarrhea, constipation, melena, bloody stool, dysuria, or hematuria.  He thought his symptoms might be from fluid accumulation so he has recently increased his Demadex from 20 a day to 40 a day.  This has caused dizziness and lightheadedness.  Outpatient Medications Prior to Visit  Medication Sig Dispense Refill  . atorvastatin (LIPITOR) 40 MG tablet Take 1 tablet (40 mg total) by mouth daily. 90 tablet 1  . b complex vitamins tablet Take 1 tablet by mouth daily at 12 noon.    . blood glucose meter kit and supplies KIT Use to check blood sugar. DX E11.9 1 each 0  . buPROPion (WELLBUTRIN) 75 MG tablet Take 1 tablet (75 mg total) by mouth 2 (two) times daily. 180 tablet 1  . cariprazine (VRAYLAR) capsule Take 1 capsule (1.5 mg total) by mouth daily. 105 capsule 0  . clonazePAM (KLONOPIN) 1 MG tablet     . insulin aspart (NOVOLOG FLEXPEN) 100 UNIT/ML FlexPen 0-15 Units, Subcutaneous, 3 times daily with meals CBG < 70: implement hypoglycemia protocol-call MD CBG 70 - 120: 0 units CBG 121 - 150: 2 units CBG 151 - 200: 3 units CBG 201 - 250: 5 units CBG 251 - 300: 8 units CBG 301 - 350: 11 units CBG 351 - 400: 15 units CBG > 400: 15 mL 0  . Insulin Pen Needle 32G X 8 MM MISC Use as directed 100 each 0  . KLOR-CON M20 20 MEQ tablet TAKE 1 TABLET BY MOUTH EVERY DAY 90 tablet 3  . magnesium oxide (MAG-OX) 400 MG tablet Take 400 mg by mouth daily as needed (leg cramps).     . metoprolol succinate (TOPROL-XL) 100 MG 24 hr tablet Take 1 tab in the morning  and take 0.5 tab in the evening 45 tablet 3  . pantoprazole (PROTONIX) 40 MG tablet TAKE 1 TABLET (40 MG TOTAL) BY MOUTH DAILY. 90 tablet 1  . PRESCRIPTION MEDICATION Inhale into the lungs See admin instructions. Bipap, pressure 16/12 with 2L of O2 - use whenever sleeping    . rivaroxaban (XARELTO) 20 MG TABS tablet Take 1 tablet (20 mg total) by mouth daily with supper. 90 tablet 1  . sildenafil (REVATIO) 20 MG tablet Take 4 tablets (80 mg total) by mouth daily as needed. 60 tablet 5  . spironolactone (ALDACTONE) 25 MG tablet TAKE 1 TABLET BY MOUTH EVERY DAY 90 tablet 3  . thiamine (VITAMIN B-1) 100 MG tablet Take 1 tablet (100 mg total) by mouth daily. 90 tablet 1  . torsemide (DEMADEX) 20 MG tablet Take 20-40 mg by mouth daily at 12 noon.     . traZODone (DESYREL) 50 MG tablet Take 1 tablet (50 mg total) by mouth at bedtime. 90 tablet 1  . Cholecalciferol 1.25 MG (50000 UT) capsule Take 1 capsule (50,000 Units total) by mouth once a week. 12 capsule 0  . Omega-3 Fatty Acids (FISH OIL) 1000 MG CAPS Take 1,000 mg by mouth daily at 12 noon.    Marland Kitchen losartan (  COZAAR) 50 MG tablet Take 1 tablet (50 mg total) by mouth daily. 90 tablet 3   No facility-administered medications prior to visit.     ROS Review of Systems  Constitutional: Positive for appetite change and unexpected weight change (wt gain). Negative for chills, diaphoresis, fatigue and fever.  Respiratory: Positive for shortness of breath. Negative for cough, chest tightness, wheezing and stridor.   Cardiovascular: Negative for chest pain, palpitations and leg swelling.  Gastrointestinal: Positive for abdominal pain. Negative for abdominal distention, blood in stool, constipation, diarrhea, nausea and vomiting.  Endocrine: Negative.   Genitourinary: Negative for decreased urine volume, difficulty urinating, discharge, dysuria, flank pain, hematuria and urgency.  Musculoskeletal: Negative.   Skin: Negative.  Negative for rash.   Neurological: Positive for dizziness. Negative for weakness and light-headedness.  Hematological: Negative for adenopathy. Does not bruise/bleed easily.  Psychiatric/Behavioral: Negative.     Objective:  BP 116/76 (BP Location: Left Arm, Patient Position: Sitting, Cuff Size: Large)   Pulse 65   Temp 98.2 F (36.8 C) (Oral)   Resp 16   Ht 5' 8"  (1.727 m)   Wt (!) 411 lb (186.4 kg)   SpO2 95%   BMI 62.49 kg/m   BP Readings from Last 3 Encounters:  04/12/19 116/76  02/18/19 122/80  01/19/19 120/80    Wt Readings from Last 3 Encounters:  04/12/19 (!) 411 lb (186.4 kg)  02/18/19 (!) 409 lb (185.5 kg)  01/19/19 (!) 395 lb 8 oz (179.4 kg)    Physical Exam Constitutional:      General: He is not in acute distress.    Appearance: He is obese. He is not ill-appearing, toxic-appearing or diaphoretic.  Eyes:     General: No scleral icterus. Cardiovascular:     Rate and Rhythm: Normal rate and regular rhythm.     Heart sounds: Normal heart sounds. No murmur. No gallop.   Pulmonary:     Effort: Pulmonary effort is normal. No respiratory distress.     Breath sounds: No stridor. No wheezing, rhonchi or rales.  Abdominal:     General: Abdomen is protuberant. Bowel sounds are decreased. There is no distension.     Palpations: Abdomen is soft. There is no shifting dullness, fluid wave, hepatomegaly, splenomegaly or mass.     Tenderness: There is generalized abdominal tenderness and tenderness in the left upper quadrant and left lower quadrant. There is no guarding or rebound. Negative signs include Murphy's sign and McBurney's sign.     Hernia: No hernia is present.  Neurological:     General: No focal deficit present.  Psychiatric:        Mood and Affect: Mood normal.        Behavior: Behavior normal.     Lab Results  Component Value Date   WBC 8.1 04/12/2019   HGB 14.8 04/12/2019   HCT 41.6 04/12/2019   PLT 221.0 04/12/2019   GLUCOSE 406 (H) 04/12/2019   CHOL 193  12/07/2018   TRIG 750.0 (H) 04/12/2019   HDL 28 (L) 12/07/2018   LDLDIRECT 166.0 09/22/2018   LDLCALC 128 (H) 12/07/2018   ALT 25 04/12/2019   AST 16 04/12/2019   NA 133 (L) 04/12/2019   K 3.9 04/12/2019   CL 96 04/12/2019   CREATININE 1.28 04/12/2019   BUN 24 (H) 04/12/2019   CO2 27 04/12/2019   TSH 1.54 01/19/2019   PSA 0.38 04/21/2018   INR 1.1 (H) 04/12/2019   HGBA1C 11.2 (H) 04/12/2019   MICROALBUR  0.9 01/19/2019    No results found.  Assessment & Plan:   [place] was seen today for abdominal pain.  Diagnoses and all orders for this visit:  Generalized abdominal tenderness without rebound tenderness- His triglycerides are elevated at 750 and he has a very mildly elevated lipase.  Examination of the abdomen is consistent with an acute process and plain films of the abdomen are normal. I am concerned he may have pancreatitis caused by hypertriglyceridemia.  I have asked him to work on his lifestyle modifications but to also start taking a fish oil supplement as well as a basal insulin. -     DG ABD ACUTE 2+V W 1V CHEST; Future -     CBC with Differential/Platelet; Future -     Amylase; Future -     Lipase; Future -     Urinalysis, Routine w reflex microscopic; Future -     Comprehensive metabolic panel; Future  Chronic combined systolic and diastolic CHF (congestive heart failure) ([place])- He has a normal volume status.  His systolic blood pressure is soft and he complains of dizziness on 40 of Demadex per day.  I have therefore asked him to decrease his dose back down to 20 mg a day.  I also think he should start taking for [employer] for CV risk reduction. -     Comprehensive metabolic panel; Future -     dapagliflozin propanediol (FARXIGA) 10 MG TABS tablet; Take 10 mg by mouth daily.  Essential hypertension- His blood pressure is adequately well controlled. -     Magnesium; Future  Fatty liver disease, nonalcoholic- His liver enzymes are normal now. -     Comprehensive  metabolic panel; Future -     Protime-INR; Future  Type II diabetes mellitus with manifestations ([place])- His A1c is up to 11.2%.  His blood sugars are not adequately well controlled.  His renal function is normal.  I have asked him to start using a basal insulin as well as metformin and an SGLT2 inhibitor.  I have also asked him to be seen by diabetic education and endocrinology. -     Hemoglobin A1c; Future -     dapagliflozin propanediol (FARXIGA) 10 MG TABS tablet; Take 10 mg by mouth daily. -     metFORMIN (GLUCOPHAGE-XR) 750 MG 24 hr tablet; Take 2 tablets (1,500 mg total) by mouth daily with breakfast. -     Amb Referral to Nutrition and Diabetic E -     Ambulatory referral to Endocrinology  CKD (chronic kidney disease), stage II  Hypertriglyceridemia -     Triglycerides; Future -     Icosapent Ethyl (VASCEPA) 1 g CAPS; Take 2 capsules (2 g total) by mouth 2 (two) times daily.  Vitamin D deficiency disease -     VITAMIN D 25 Hydroxy (Vit-D Deficiency, Fractures); Future -     Cholecalciferol 1.25 MG (50000 UT) capsule; Take 1 capsule (50,000 Units total) by mouth once a week.   I have discontinued [employer] Dumas's Fish Oil. I am also having him start on Icosapent Ethyl, dapagliflozin propanediol, and metFORMIN. Additionally, I am having him maintain his magnesium oxide, thiamine, blood glucose meter kit and supplies, torsemide, spironolactone, traZODone, metoprolol succinate, PRESCRIPTION MEDICATION, b complex vitamins, losartan, Insulin Pen Needle, insulin aspart, rivaroxaban, [place]-Con M20, cariprazine, buPROPion, pantoprazole, atorvastatin, clonazePAM, sildenafil, and Cholecalciferol.  Meds ordered this encounter  Medications  . Icosapent Ethyl (VASCEPA) 1 g CAPS    Sig: Take 2 capsules (2 g  total) by mouth 2 (two) times daily.    Dispense:  360 capsule    Refill:  1  . Cholecalciferol 1.25 MG (50000 UT) capsule    Sig: Take 1 capsule (50,000 Units total) by mouth once a week.     Dispense:  12 capsule    Refill:  0  . dapagliflozin propanediol (FARXIGA) 10 MG TABS tablet    Sig: Take 10 mg by mouth daily.    Dispense:  90 tablet    Refill:  1  . metFORMIN (GLUCOPHAGE-XR) 750 MG 24 hr tablet    Sig: Take 2 tablets (1,500 mg total) by mouth daily with breakfast.    Dispense:  180 tablet    Refill:  1     Follow-up: Return if symptoms worsen or fail to improve.  Scarlette Calico, MD

## 2019-04-12 NOTE — Patient Instructions (Signed)
Abdominal Pain, Adult  Abdominal pain can be caused by many things. Often, abdominal pain is not serious and it gets better with no treatment or by being treated at home. However, sometimes abdominal pain is serious. Your health care provider will do a medical history and a physical exam to try to determine the cause of your abdominal pain.  Follow these instructions at home:   Take over-the-counter and prescription medicines only as told by your health care provider. Do not take a laxative unless told by your health care provider.   Drink enough fluid to keep your urine clear or pale yellow.   Watch your condition for any changes.   Keep all follow-up visits as told by your health care provider. This is important.  Contact a health care provider if:   Your abdominal pain changes or gets worse.   You are not hungry or you lose weight without trying.   You are constipated or have diarrhea for more than 2-3 days.   You have pain when you urinate or have a bowel movement.   Your abdominal pain wakes you up at night.   Your pain gets worse with meals, after eating, or with certain foods.   You are throwing up and cannot keep anything down.   You have a fever.  Get help right away if:   Your pain does not go away as soon as your health care provider told you to expect.   You cannot stop throwing up.   Your pain is only in areas of the abdomen, such as the right side or the left lower portion of the abdomen.   You have bloody or black stools, or stools that look like tar.   You have severe pain, cramping, or bloating in your abdomen.   You have signs of dehydration, such as:  ? Dark urine, very little urine, or no urine.  ? Cracked lips.  ? Dry mouth.  ? Sunken eyes.  ? Sleepiness.  ? Weakness.  This information is not intended to replace advice given to you by your health care provider. Make sure you discuss any questions you have with your health care provider.  Document Released: 08/27/2005 Document  Revised: 06/06/2016 Document Reviewed: 04/30/2016  Elsevier Interactive Patient Education  2019 [employer]

## 2019-04-13 ENCOUNTER — Ambulatory Visit: Payer: Self-pay | Admitting: Internal Medicine

## 2019-04-13 LAB — HEMOGLOBIN A1C
Hgb A1c MFr Bld: 11.2 % of total Hgb — ABNORMAL HIGH (ref <5.7)
Mean Plasma Glucose: 275 (calc)
eAG (mmol/L): 15.2 (calc)

## 2019-04-13 NOTE — Telephone Encounter (Signed)
Dr. Ronnald Ramp is out of the office.   Patient called and stated that he is concerned about his labs. Mainly the glucose and the triglycericdes. Pt stated that he has Janumet XR 50-1000 mg and Tresiba still at the house. These medications were dc'ed due to the patient stopped taking them. Pt wants to know if he can restart taking the medication.   Please advise in PCP absence.

## 2019-04-13 NOTE — Telephone Encounter (Signed)
Sent a new note to Dr. Quay Burow for assistance while PCP is out of office.

## 2019-04-13 NOTE — Telephone Encounter (Signed)
Pt contacted and informed of same.  

## 2019-04-13 NOTE — Telephone Encounter (Signed)
Pt called in concerned about his lab results that Dr. Ronnald Ramp sent him via Blue. See triage notes.  I transferred his call to [facility] in Dr. Ronnald Ramp' office since that's who he dealt with yesterday while in the office.  I sent these triage notes over to the office.    Reason for Disposition . Blood glucose > 400 mg/dL (22.2 mmol/L)  Answer Assessment - Initial Assessment Questions 1. BLOOD GLUCOSE: "What is your blood glucose level?"      437 this morning.  I did not eat yet.   I saw Dr. Ronnald Ramp yesterday.   He sent me a message that my triglyerides  are in the 700's and my glucose was in the 400's.  I'm off all my medications several months ago.   I was in the hospital on steroids which ran my sugar up.    2. ONSET: "When did you check the blood glucose?"     This morning 3. USUAL RANGE: "What is your glucose level usually?" (e.g., usual fasting morning value, usual evening value)     It's like 118 after I eat. 4. KETONES: "Do you check for ketones (urine or blood test strips)?" If yes, ask: "What does the test show now?"      I have ketones because I can see the flakes in my urine. 5. TYPE 1 or 2:  "Do you know what type of diabetes you have?"  (e.g., Type 1, Type 2, Gestational; doesn't know)      Don't know   Probably type II 6. INSULIN: "Do you take insulin?" "What type of insulin(s) do you use? What is the mode of delivery? (syringe, pen (e.g., injection or  pump)?"      No insulin just while I was in the hospital on steroids.    7. DIABETES PILLS: "Do you take any pills for your diabetes?" If yes, ask: "Have you missed taking any pills recently?"     See chart 8. OTHER SYMPTOMS: "Do you have any symptoms?" (e.g., fever, frequent urination, difficulty breathing, dizziness, weakness, vomiting)     I'm cholesterol level is very high.   I have CHF.    When I saw Dr. Ronnald Ramp yesterday I told him I was dizzy.   9. PREGNANCY: "Is there any chance you are pregnant?" "When was your last  menstrual period?"     N/A  Protocols used: DIABETES - HIGH BLOOD SUGAR-A-AH

## 2019-04-13 NOTE — Telephone Encounter (Signed)
Yes, ok to restart

## 2019-04-14 ENCOUNTER — Encounter: Payer: Self-pay | Admitting: Internal Medicine

## 2019-04-14 MED ORDER — DAPAGLIFLOZIN PROPANEDIOL 10 MG PO TABS: 10.0000 mg | ORAL_TABLET | Freq: Every day | ORAL | 1 refills | Status: DC

## 2019-04-14 MED ORDER — INSULIN DEGLUDEC 200 UNIT/ML ~~LOC~~ SOPN: 50.0000 [IU] | PEN_INJECTOR | Freq: Every day | SUBCUTANEOUS | 1 refills | Status: DC

## 2019-04-14 MED ORDER — METFORMIN HCL ER 750 MG PO TB24: 1500.0000 mg | ORAL_TABLET | Freq: Every day | ORAL | 1 refills | Status: DC

## 2019-04-14 MED ORDER — TORSEMIDE 20 MG PO TABS: 20.0000 mg | ORAL_TABLET | Freq: Every day | ORAL | 1 refills | Status: DC

## 2019-04-15 NOTE — Addendum Note (Signed)
Addended by: [name] on: 04/15/2019 05:15 PM   Modules accepted: Orders

## 2019-04-20 ENCOUNTER — Ambulatory Visit (INDEPENDENT_AMBULATORY_CARE_PROVIDER_SITE_OTHER): Payer: Medicare HMO | Admitting: Internal Medicine

## 2019-04-20 ENCOUNTER — Other Ambulatory Visit: Payer: Self-pay

## 2019-04-20 ENCOUNTER — Other Ambulatory Visit (INDEPENDENT_AMBULATORY_CARE_PROVIDER_SITE_OTHER): Payer: Medicare HMO

## 2019-04-20 ENCOUNTER — Encounter: Payer: Self-pay | Admitting: Internal Medicine

## 2019-04-20 VITALS — BP 116/68 | HR 80 | Temp 98.2°F | Resp 16 | Ht 68.0 in | Wt >= 6400 oz

## 2019-04-20 DIAGNOSIS — I739 Peripheral vascular disease, unspecified: Secondary | ICD-10-CM | POA: Diagnosis not present

## 2019-04-20 DIAGNOSIS — M79661 Pain in right lower leg: Secondary | ICD-10-CM | POA: Insufficient documentation

## 2019-04-20 DIAGNOSIS — E118 Type 2 diabetes mellitus with unspecified complications: Secondary | ICD-10-CM | POA: Diagnosis not present

## 2019-04-20 DIAGNOSIS — M79662 Pain in left lower leg: Secondary | ICD-10-CM

## 2019-04-20 DIAGNOSIS — R10817 Generalized abdominal tenderness: Secondary | ICD-10-CM | POA: Diagnosis not present

## 2019-04-20 LAB — POCT GLUCOSE (DEVICE FOR HOME USE): Glucose Fasting, POC: 189 mg/dL — AB (ref 70–99)

## 2019-04-20 LAB — D-DIMER, QUANTITATIVE: D-Dimer, Quant: 0.41 mcg/mL FEU (ref <0.50)

## 2019-04-20 LAB — BASIC METABOLIC PANEL
BUN: 18 mg/dL (ref 6–23)
CO2: 24 mEq/L (ref 19–32)
Calcium: 8.8 mg/dL (ref 8.4–10.5)
Chloride: 104 mEq/L (ref 96–112)
Creatinine, Ser: 1.18 mg/dL (ref 0.40–1.50)
GFR: 78.88 mL/min (ref >60.00)
Glucose, Bld: 183 mg/dL — ABNORMAL HIGH (ref 70–99)
Potassium: 3.8 mEq/L (ref 3.5–5.1)
Sodium: 137 mEq/L (ref 135–145)

## 2019-04-20 LAB — CK: Total CK: 178 U/L (ref 7–232)

## 2019-04-20 NOTE — Progress Notes (Signed)
Subjective:  Patient ID: [name], male    DOB: 22-May-1966  Age: 53 y.o. MRN: [id number]  CC: Hypertension; Diabetes; Hyperlipidemia; and Abdominal Pain   HPI ARNOL MCGIBBON presents for f/up - He tells me the that his abdominal pain has resolved.  He reveals to me that when I saw him last time and he had abdominal pain, mild pancreatitis, and elevated triglycerides that he had been drinking liquor and wine again.  He tells me he only drinks sporadically and has not had any more alcohol in the last few days.  He denies nausea, vomiting, or loss of appetite.  He has not started taking FARXIGA yet.  Since I last saw him he has developed pain in his calves.  He says the pain radiates into his feet.  There is also some component of claudication.  Outpatient Medications Prior to Visit  Medication Sig Dispense Refill  . atorvastatin (LIPITOR) 40 MG tablet Take 1 tablet (40 mg total) by mouth daily. 90 tablet 1  . b complex vitamins tablet Take 1 tablet by mouth daily at 12 noon.    . blood glucose meter kit and supplies KIT Use to check blood sugar. DX E11.9 1 each 0  . cariprazine (VRAYLAR) capsule Take 1 capsule (1.5 mg total) by mouth daily. 105 capsule 0  . Cholecalciferol 1.25 MG (50000 UT) capsule Take 1 capsule (50,000 Units total) by mouth once a week. 12 capsule 0  . clonazePAM (KLONOPIN) 1 MG tablet     . dapagliflozin propanediol (FARXIGA) 10 MG TABS tablet Take 10 mg by mouth daily. 90 tablet 1  . Icosapent Ethyl (VASCEPA) 1 g CAPS Take 2 capsules (2 g total) by mouth 2 (two) times daily. 360 capsule 1  . Insulin Degludec (TRESIBA FLEXTOUCH) 200 UNIT/ML SOPN Inject 50 Units into the skin daily. 9 mL 1  . Insulin Pen Needle 32G X 8 MM MISC Use as directed 100 each 0  . KLOR-CON M20 20 MEQ tablet TAKE 1 TABLET BY MOUTH EVERY DAY 90 tablet 3  . magnesium oxide (MAG-OX) 400 MG tablet Take 400 mg by mouth daily as needed (leg cramps).     . metoprolol succinate (TOPROL-XL) 100 MG 24  hr tablet Take 1 tab in the morning and take 0.5 tab in the evening 45 tablet 3  . pantoprazole (PROTONIX) 40 MG tablet TAKE 1 TABLET (40 MG TOTAL) BY MOUTH DAILY. 90 tablet 1  . PRESCRIPTION MEDICATION Inhale into the lungs See admin instructions. Bipap, pressure 16/12 with 2L of O2 - use whenever sleeping    . rivaroxaban (XARELTO) 20 MG TABS tablet Take 1 tablet (20 mg total) by mouth daily with supper. 90 tablet 1  . sildenafil (REVATIO) 20 MG tablet Take 4 tablets (80 mg total) by mouth daily as needed. 60 tablet 5  . spironolactone (ALDACTONE) 25 MG tablet TAKE 1 TABLET BY MOUTH EVERY DAY 90 tablet 3  . torsemide (DEMADEX) 20 MG tablet Take 1 tablet (20 mg total) by mouth daily. 90 tablet 1  . losartan (COZAAR) 50 MG tablet Take 1 tablet (50 mg total) by mouth daily. 90 tablet 3  . buPROPion (WELLBUTRIN) 75 MG tablet Take 1 tablet (75 mg total) by mouth 2 (two) times daily. 180 tablet 1  . insulin aspart (NOVOLOG FLEXPEN) 100 UNIT/ML FlexPen 0-15 Units, Subcutaneous, 3 times daily with meals CBG < 70: implement hypoglycemia protocol-call MD CBG 70 - 120: 0 units CBG 121 - 150: 2  units CBG 151 - 200: 3 units CBG 201 - 250: 5 units CBG 251 - 300: 8 units CBG 301 - 350: 11 units CBG 351 - 400: 15 units CBG > 400: 15 mL 0  . metFORMIN (GLUCOPHAGE-XR) 750 MG 24 hr tablet Take 2 tablets (1,500 mg total) by mouth daily with breakfast. 180 tablet 1  . thiamine (VITAMIN B-1) 100 MG tablet Take 1 tablet (100 mg total) by mouth daily. 90 tablet 1  . traZODone (DESYREL) 50 MG tablet Take 1 tablet (50 mg total) by mouth at bedtime. 90 tablet 1   No facility-administered medications prior to visit.     ROS Review of Systems  Constitutional: Negative.  Negative for diaphoresis and fever.  HENT: Negative.   Eyes: Negative.   Respiratory: Negative for cough, chest tightness and wheezing.   Cardiovascular: Negative for chest pain, palpitations and leg swelling.  Gastrointestinal: Negative for  abdominal pain, constipation, diarrhea, nausea and vomiting.  Genitourinary: Negative for difficulty urinating.  Musculoskeletal: Positive for myalgias. Negative for arthralgias.  Skin: Negative.  Negative for color change and pallor.  Neurological: Negative.  Negative for dizziness and light-headedness.  Hematological: Negative for adenopathy. Does not bruise/bleed easily.  Psychiatric/Behavioral: Negative.     Objective:  BP 116/68 (BP Location: Left Arm, Patient Position: Sitting, Cuff Size: Large)   Pulse 80   Temp 98.2 F (36.8 C) (Oral)   Resp 16   Ht 5' 8"  (1.727 m)   Wt (!) 411 lb (186.4 kg)   SpO2 98%   BMI 62.49 kg/m   BP Readings from Last 3 Encounters:  04/20/19 116/68  04/12/19 116/76  02/18/19 122/80    Wt Readings from Last 3 Encounters:  04/20/19 (!) 411 lb (186.4 kg)  04/12/19 (!) 411 lb (186.4 kg)  02/18/19 (!) 409 lb (185.5 kg)    Physical Exam Constitutional:      Appearance: He is obese. He is not ill-appearing or diaphoretic.  HENT:     Mouth/Throat:     Mouth: Mucous membranes are moist.     Pharynx: No oropharyngeal exudate.  Eyes:     General: No scleral icterus. Cardiovascular:     Rate and Rhythm: Normal rate and regular rhythm.     Heart sounds: Normal heart sounds. No murmur. No gallop.   Pulmonary:     Effort: Pulmonary effort is normal. No respiratory distress.     Breath sounds: No stridor. No wheezing, rhonchi or rales.  Abdominal:     General: Abdomen is protuberant. Bowel sounds are normal.     Palpations: Abdomen is soft. There is no hepatomegaly or splenomegaly.     Tenderness: There is no abdominal tenderness.  Musculoskeletal: Normal range of motion.        General: No swelling.     Right lower leg: Normal. He exhibits no swelling and no deformity. No edema.     Left lower leg: Normal. He exhibits no swelling and no deformity. No edema.  Skin:    General: Skin is warm.     Findings: No rash.  Neurological:     General:  No focal deficit present.     Mental Status: He is alert.  Psychiatric:        Mood and Affect: Mood normal.        Behavior: Behavior normal.     Lab Results  Component Value Date   WBC 8.1 04/12/2019   HGB 14.8 04/12/2019   HCT 41.6 04/12/2019  PLT 221.0 04/12/2019   GLUCOSE 183 (H) 04/20/2019   CHOL 193 12/07/2018   TRIG 750.0 (H) 04/12/2019   HDL 28 (L) 12/07/2018   LDLDIRECT 166.0 09/22/2018   LDLCALC 128 (H) 12/07/2018   ALT 25 04/12/2019   AST 16 04/12/2019   NA 137 04/20/2019   K 3.8 04/20/2019   CL 104 04/20/2019   CREATININE 1.18 04/20/2019   BUN 18 04/20/2019   CO2 24 04/20/2019   TSH 1.54 01/19/2019   PSA 0.38 04/21/2018   INR 1.1 (H) 04/12/2019   HGBA1C 11.2 (H) 04/12/2019   MICROALBUR 0.9 01/19/2019    Dg Abd Acute 2+v W 1v Chest  Result Date: 04/12/2019 CLINICAL DATA:  Epigastric pain and bloating. Generalized abdominal tenderness. EXAM: DG ABDOMEN ACUTE W/ 1V CHEST COMPARISON:  Chest x-ray dated 11/20/2018 and MRI of the abdomen dated 04/22/2018 and CT scan of the chest dated 04/12/2017 FINDINGS: There is no evidence of dilated bowel loops or free intraperitoneal air. No radiopaque calculi or other significant radiographic abnormality is seen. Heart size and mediastinal contours are within normal limits. Both lungs are clear except for a tiny area of linear atelectasis at the left lung base laterally. IMPRESSION: Negative abdominal radiographs.  No acute cardiopulmonary disease. Electronically Signed   By: Lorriane Shire M.D.   On: 04/12/2019 11:05    Assessment & Plan:   [facility] was seen today for hypertension, diabetes, hyperlipidemia and abdominal pain.  Diagnoses and all orders for this visit:  Bilateral calf pain- The exam is unremarkable.  His d-dimer is normal, his CK is normal, and there is no evidence of acidosis on his basic metabolic panel.  I am concerned that this may be related to PAD so I have asked him to undergo arterial flow studies. -      Basic metabolic panel; Future -     D-dimer, quantitative (not at [facility]); Future -     CK; Future -     VAS [place] ABI WITH/WO TBI; Future  Claudication of both lower extremities ([place])- He has symptoms and diminished pulses but there is no evidence of limb threatening ischemia. -     VAS [place] ABI WITH/WO TBI; Future  Type II diabetes mellitus with manifestations ([place])- He agrees to be compliant with the prescribed medicines to control his blood sugar. -     POCT Glucose (Device for Home Use) -     HM Diabetes Foot Exam  Generalized abdominal tenderness without rebound tenderness- The pain was most consistent with an episode of mild pancreatitis.  The pain has resolved since he has abstained from alcohol intake and has started treating his high triglycerides and sugar.   I have discontinued Romello L. Cortina's thiamine, traZODone, insulin aspart, buPROPion, and metFORMIN. I am also having him maintain his magnesium oxide, blood glucose meter kit and supplies, spironolactone, metoprolol succinate, PRESCRIPTION MEDICATION, b complex vitamins, losartan, Insulin Pen Needle, rivaroxaban, Klor-Con M20, cariprazine, pantoprazole, atorvastatin, clonazePAM, sildenafil, Icosapent Ethyl, Cholecalciferol, dapagliflozin propanediol, Insulin Degludec, and torsemide.  No orders of the defined types were placed in this encounter.    Follow-up: No follow-ups on file.  Scarlette Calico, MD

## 2019-04-22 ENCOUNTER — Ambulatory Visit (HOSPITAL_COMMUNITY)
Admission: RE | Admit: 2019-04-22 | Discharge: 2019-04-22 | Disposition: A | Discharge status: Home / Self Care | Payer: Medicare HMO | Source: Ambulatory Visit | Attending: Internal Medicine | Admitting: Internal Medicine

## 2019-04-22 ENCOUNTER — Telehealth (HOSPITAL_COMMUNITY): Payer: Self-pay | Admitting: Rehabilitation

## 2019-04-22 ENCOUNTER — Other Ambulatory Visit: Payer: Self-pay

## 2019-04-22 DIAGNOSIS — M79661 Pain in right lower leg: Secondary | ICD-10-CM

## 2019-04-22 DIAGNOSIS — I739 Peripheral vascular disease, unspecified: Secondary | ICD-10-CM

## 2019-04-22 DIAGNOSIS — M79662 Pain in left lower leg: Secondary | ICD-10-CM | POA: Insufficient documentation

## 2019-04-22 NOTE — Telephone Encounter (Signed)
The above patient or their representative was contacted and gave the following answers to these questions:         Do you have any of the following symptoms? No  Fever                    Cough                   Shortness of breath  Do  you have any of the following other symptoms? No   muscle pain         vomiting,        diarrhea        rash         weakness        red eye        abdominal pain         bruising          bruising or bleeding              joint pain           severe headache    Have you been in contact with someone who was or has been sick in the past 2 weeks? No  Yes                 Unsure                         Unable to assess   Does the person that you were in contact with have any of the following symptoms?   Cough         shortness of breath           muscle pain         vomiting,            diarrhea            rash            weakness           fever            red eye           abdominal pain           bruising  or  bleeding                joint pain                severe headache               Have you  or someone you have been in contact with traveled internationally in th last month? No        If yes, which countries?   Have you  or someone you have been in contact with traveled outside [place] in th last month? No         If yes, which state and city?   COMMENTS OR ACTION PLAN FOR THIS PATIENT:          

## 2019-04-27 ENCOUNTER — Other Ambulatory Visit: Payer: Self-pay | Admitting: Internal Medicine

## 2019-04-27 ENCOUNTER — Encounter: Payer: Self-pay | Admitting: Internal Medicine

## 2019-04-28 ENCOUNTER — Telehealth: Payer: Self-pay | Admitting: Internal Medicine

## 2019-04-28 ENCOUNTER — Other Ambulatory Visit: Payer: Self-pay | Admitting: Internal Medicine

## 2019-04-28 ENCOUNTER — Encounter: Payer: Self-pay | Admitting: Internal Medicine

## 2019-04-28 NOTE — Telephone Encounter (Signed)
I don't want him to take this med anymore  [name]

## 2019-04-28 NOTE — Telephone Encounter (Unsigned)
Copied from [place] [id number]. Topic: Quick Communication - Rx Refill/Question >> Apr 28, 2019  3:17 PM [name] wrote: Medication: clonazePAM (KLONOPIN) 1 MG tablet   Has the patient contacted their pharmacy? Yes.  Pt is requesting [name] call back regarding why the medication was denied. Please advise.  (Agent: If no, request that the patient contact the pharmacy for the refill.) (Agent: If yes, when and what did the pharmacy advise?)  Preferred Pharmacy (with phone number or street name): [id number] [place], [address] [place] [address] Phone: [phone] Fax: [phone] Not [name] 24 hour pharmacy; exact hours not known.    Agent: Please be advised that RX refills may take up to 3 business days. We ask that you follow-up with your pharmacy.

## 2019-04-29 ENCOUNTER — Encounter (HOSPITAL_COMMUNITY): Payer: Self-pay

## 2019-04-29 ENCOUNTER — Other Ambulatory Visit: Payer: Self-pay | Admitting: Family

## 2019-04-29 NOTE — Telephone Encounter (Signed)
Pt called regarding this denied medication, I told him I do not know the reason as to why you were taken off, he states he was put on it by his Cardiologists for his panic attacks because he has a heart condition. He said this issue is causing him an attack right now. He said he needs the med, I explained we have to follow Dr. Ronnald Ramp orders and right now he has taken you off of it and he needs to approve for you to take it and he is out of the office today and he will be back on Monday and we could give him a furthur explanation then. He said I need to talk to someone today and I told him this medication had to be approved by Dr. Ronnald Ramp, he said so yall are going to leave me like this and I started to explain he can go to an urgent care or ED if needed and before I could finish the patient hung up on me.

## 2019-05-02 ENCOUNTER — Other Ambulatory Visit: Payer: Self-pay | Admitting: Internal Medicine

## 2019-05-02 ENCOUNTER — Telehealth: Payer: Self-pay | Admitting: Internal Medicine

## 2019-05-02 DIAGNOSIS — G4733 Obstructive sleep apnea (adult) (pediatric): Secondary | ICD-10-CM | POA: Diagnosis not present

## 2019-05-02 DIAGNOSIS — I10 Essential (primary) hypertension: Secondary | ICD-10-CM | POA: Diagnosis not present

## 2019-05-02 DIAGNOSIS — I509 Heart failure, unspecified: Secondary | ICD-10-CM | POA: Diagnosis not present

## 2019-05-02 NOTE — Telephone Encounter (Signed)
I have talked with patient and explicitly explained that patient is at extremely high risk for overdose if he is drinking and taking Klonopin at the same time--patient also has chronic liver condition, fatty liver disease and needs to keep liver from further damage so drinking ANY alcohol at all is not good idea and is the reason [name] prefers he not take this medication---patient stated he was originally put on this medication by his cardiologist for severe panic attacks and since he has not been taking this medication continues to experience severe panic/anxiety attacks every day--states his chest tightens and he feels being too anxious that panic attacks will  worsen his heart and/or cause heart condition-=-patient states he is not drinking and will not drink while taking this medication, but he cannot take care of his son appropriately if he is this anxious and overwhelmed----routing back to [name], please advise, thanks

## 2019-05-02 NOTE — Telephone Encounter (Signed)
Copied from [place] [id number]. Topic: Quick Communication - See Telephone Encounter >> May 02, 2019  9:52 AM Vernona Rieger wrote: CRM for notification. See Telephone encounter for: 05/02/19. Patient would like to know why his clonazePAM (KLONOPIN) 1 MG tablet keeps being denied. He said he has been on them for years. Please Advise

## 2019-05-05 ENCOUNTER — Other Ambulatory Visit: Payer: Self-pay

## 2019-05-05 NOTE — Patient Outreach (Signed)
[facility]) Care Management  05/05/2019  [name] [birth year] [id number]  TELEPHONE SCREENING Referral date: 04/18/19 Referral source: primary MD  Referral reason: diabetes Insurance: [employer]  Attempt #1  Telephone call to patient regarding.primary MD referral. Unable to reach patient. HIPAA compliant voice message left with call back phone number.   PLAN: RNCM will attempt 2nd telephone call to patient within 4 business days.  RNCM will send outreach letter to attempt contact.   Quinn Plowman RN,BSN,CCM [facility] Telephonic  [phone]

## 2019-05-09 ENCOUNTER — Other Ambulatory Visit: Payer: Self-pay

## 2019-05-09 NOTE — Patient Outreach (Signed)
[facility]) Care Management  05/09/2019  [name] [birth year] [id number]  TELEPHONE SCREENING Referral date: 04/18/19 Referral source: primary MD  Referral reason: diabetes Insurance: [employer]  Attempt #2  Telephone call to patient regarding primary MD referral. HIPAA verified with patient. RNCM introduced herself and explained reason for call.  RNCM discussed and offered [facility] care management services. Patient verbally agreed. Patient states he is unable to complete screening at this time due to being involved with something at work.  Patient request return call to complete screening.   PLAN: RNCM will attempt call to patient within 2 business days.   Quinn Plowman RN,BSN,CCM [facility] Telephonic  [phone]

## 2019-05-10 ENCOUNTER — Other Ambulatory Visit: Payer: Self-pay

## 2019-05-10 NOTE — Patient Outreach (Signed)
[facility]) Care Management  05/10/2019  [name] 1966-11-25 [id number]  TELEPHONE SCREENING Referral date:04/18/19 Referral source:primary MD Referral reason:diabetes Insurance:[employer]  Attempt #3  Telephone call to patient regarding primary MD referral. Unable to reach patient. HIPAA compliant voice message left with call back phone number.   PLAN; If no return call will proceed with closure.   Quinn Plowman RN,BSN,CCM [facility] Telephonic  [phone]

## 2019-05-13 ENCOUNTER — Ambulatory Visit: Payer: Self-pay

## 2019-05-18 ENCOUNTER — Other Ambulatory Visit: Payer: Self-pay

## 2019-05-18 NOTE — Patient Outreach (Signed)
[facility] Care Management  05/18/2019  [name] Jun 01, 1966 [id number]  TELEPHONE SCREENING Referral date:04/18/19 Referral source:primary MD Referral reason:diabetes Insurance:[employer]na  Case closure  No response after 3 telephone calls and outreach letter attempt.  PLAN: RNCM will close patient due to being unable to reach.  RNCM will send closure notification to patient's primary MD   Quinn Plowman RN,BSN,CCM [facility] Telephonic  [phone]

## 2019-05-23 ENCOUNTER — Encounter: Payer: Self-pay | Admitting: Internal Medicine

## 2019-05-30 ENCOUNTER — Telehealth: Payer: Self-pay | Admitting: Internal Medicine

## 2019-05-30 NOTE — Telephone Encounter (Signed)
Patient is requesting more of the samples he got for his diabetes back in may from PCP. Patient does not know what the samples were called.  Patient states he really likes the samples PCP gave and does not hurt his stomach like the metformin did.  Patient call back # [phone]   [id number] [place], [place] [phone] (Phone) [phone] (Fax)

## 2019-05-30 NOTE — Telephone Encounter (Addendum)
Pt is requesting to change the Farxiga to Janumet XR 50-1000mg .   Informed pt that the Farixga and Tyler Aas is on his med list and that he should take those medications. I informed that the Janumet is not covered under his insurance and that is why it was changed.

## 2019-06-01 DIAGNOSIS — I10 Essential (primary) hypertension: Secondary | ICD-10-CM | POA: Diagnosis not present

## 2019-06-01 DIAGNOSIS — G4733 Obstructive sleep apnea (adult) (pediatric): Secondary | ICD-10-CM | POA: Diagnosis not present

## 2019-06-01 DIAGNOSIS — I509 Heart failure, unspecified: Secondary | ICD-10-CM | POA: Diagnosis not present

## 2019-06-02 ENCOUNTER — Other Ambulatory Visit (HOSPITAL_COMMUNITY): Payer: Self-pay | Admitting: Adult Health

## 2019-06-02 ENCOUNTER — Other Ambulatory Visit (HOSPITAL_COMMUNITY): Payer: Self-pay | Admitting: Cardiology

## 2019-06-02 DIAGNOSIS — I5042 Chronic combined systolic (congestive) and diastolic (congestive) heart failure: Secondary | ICD-10-CM

## 2019-06-20 DIAGNOSIS — G4733 Obstructive sleep apnea (adult) (pediatric): Secondary | ICD-10-CM | POA: Diagnosis not present

## 2019-06-29 ENCOUNTER — Other Ambulatory Visit: Payer: Self-pay | Admitting: Internal Medicine

## 2019-06-29 DIAGNOSIS — E559 Vitamin D deficiency, unspecified: Secondary | ICD-10-CM

## 2019-07-02 DIAGNOSIS — I10 Essential (primary) hypertension: Secondary | ICD-10-CM | POA: Diagnosis not present

## 2019-07-02 DIAGNOSIS — I509 Heart failure, unspecified: Secondary | ICD-10-CM | POA: Diagnosis not present

## 2019-07-02 DIAGNOSIS — G4733 Obstructive sleep apnea (adult) (pediatric): Secondary | ICD-10-CM | POA: Diagnosis not present

## 2019-07-21 DIAGNOSIS — G4733 Obstructive sleep apnea (adult) (pediatric): Secondary | ICD-10-CM | POA: Diagnosis not present

## 2019-08-02 DIAGNOSIS — I10 Essential (primary) hypertension: Secondary | ICD-10-CM | POA: Diagnosis not present

## 2019-08-02 DIAGNOSIS — G4733 Obstructive sleep apnea (adult) (pediatric): Secondary | ICD-10-CM | POA: Diagnosis not present

## 2019-08-02 DIAGNOSIS — I509 Heart failure, unspecified: Secondary | ICD-10-CM | POA: Diagnosis not present

## 2019-08-16 ENCOUNTER — Other Ambulatory Visit (HOSPITAL_COMMUNITY): Payer: Self-pay | Admitting: Adult Health

## 2019-08-16 DIAGNOSIS — K219 Gastro-esophageal reflux disease without esophagitis: Secondary | ICD-10-CM

## 2019-08-18 ENCOUNTER — Telehealth: Payer: Self-pay | Admitting: Internal Medicine

## 2019-08-18 ENCOUNTER — Other Ambulatory Visit: Payer: Self-pay | Admitting: Internal Medicine

## 2019-08-18 DIAGNOSIS — K219 Gastro-esophageal reflux disease without esophagitis: Secondary | ICD-10-CM

## 2019-08-18 MED ORDER — PANTOPRAZOLE SODIUM 40 MG PO TBEC: DELAYED_RELEASE_TABLET | ORAL | 1 refills | Status: DC

## 2019-08-18 NOTE — Telephone Encounter (Signed)
[facility] [place] Please advise.

## 2019-08-18 NOTE — Telephone Encounter (Signed)
°  Relation to pt: self  Call back number: [phone]  [id number] [place], [place] [phone] (Phone) [phone] (Fax)     Reason for call:  Patient requesting pantoprazole (PROTONIX) 40 MG tablet stating pharmacy contacted the wrong Doctor office, patient completely out, informed patient please allow 48 to 72 hour turn around time.

## 2019-08-21 DIAGNOSIS — G4733 Obstructive sleep apnea (adult) (pediatric): Secondary | ICD-10-CM | POA: Diagnosis not present

## 2019-09-01 DIAGNOSIS — I509 Heart failure, unspecified: Secondary | ICD-10-CM | POA: Diagnosis not present

## 2019-09-01 DIAGNOSIS — I10 Essential (primary) hypertension: Secondary | ICD-10-CM | POA: Diagnosis not present

## 2019-09-01 DIAGNOSIS — G4733 Obstructive sleep apnea (adult) (pediatric): Secondary | ICD-10-CM | POA: Diagnosis not present

## 2019-09-04 ENCOUNTER — Other Ambulatory Visit (HOSPITAL_COMMUNITY): Payer: Self-pay | Admitting: Cardiology

## 2019-09-04 ENCOUNTER — Other Ambulatory Visit: Payer: Self-pay | Admitting: Internal Medicine

## 2019-09-04 DIAGNOSIS — E559 Vitamin D deficiency, unspecified: Secondary | ICD-10-CM

## 2019-10-02 DIAGNOSIS — G4733 Obstructive sleep apnea (adult) (pediatric): Secondary | ICD-10-CM | POA: Diagnosis not present

## 2019-10-02 DIAGNOSIS — I509 Heart failure, unspecified: Secondary | ICD-10-CM | POA: Diagnosis not present

## 2019-10-02 DIAGNOSIS — I10 Essential (primary) hypertension: Secondary | ICD-10-CM | POA: Diagnosis not present

## 2019-10-03 ENCOUNTER — Other Ambulatory Visit: Payer: Self-pay | Admitting: Internal Medicine

## 2019-10-03 DIAGNOSIS — I480 Paroxysmal atrial fibrillation: Secondary | ICD-10-CM

## 2019-10-03 DIAGNOSIS — I2699 Other pulmonary embolism without acute cor pulmonale: Secondary | ICD-10-CM

## 2019-10-11 ENCOUNTER — Ambulatory Visit (HOSPITAL_COMMUNITY)
Admission: RE | Admit: 2019-10-11 | Discharge: 2019-10-11 | Disposition: A | Discharge status: Home / Self Care | Payer: Medicare HMO | Source: Ambulatory Visit | Attending: Cardiology | Admitting: Cardiology

## 2019-10-11 ENCOUNTER — Other Ambulatory Visit: Payer: Self-pay

## 2019-10-11 ENCOUNTER — Encounter (HOSPITAL_COMMUNITY): Payer: Self-pay

## 2019-10-11 VITALS — BP 138/90 | HR 65 | Wt >= 6400 oz

## 2019-10-11 DIAGNOSIS — F419 Anxiety disorder, unspecified: Secondary | ICD-10-CM | POA: Diagnosis not present

## 2019-10-11 DIAGNOSIS — Z79899 Other long term (current) drug therapy: Secondary | ICD-10-CM | POA: Insufficient documentation

## 2019-10-11 DIAGNOSIS — G4733 Obstructive sleep apnea (adult) (pediatric): Secondary | ICD-10-CM | POA: Diagnosis not present

## 2019-10-11 DIAGNOSIS — Z6841 Body Mass Index (BMI) 40.0 and over, adult: Secondary | ICD-10-CM | POA: Diagnosis not present

## 2019-10-11 DIAGNOSIS — Z8249 Family history of ischemic heart disease and other diseases of the circulatory system: Secondary | ICD-10-CM | POA: Insufficient documentation

## 2019-10-11 DIAGNOSIS — Z86711 Personal history of pulmonary embolism: Secondary | ICD-10-CM | POA: Diagnosis not present

## 2019-10-11 DIAGNOSIS — Z794 Long term (current) use of insulin: Secondary | ICD-10-CM | POA: Insufficient documentation

## 2019-10-11 DIAGNOSIS — F17210 Nicotine dependence, cigarettes, uncomplicated: Secondary | ICD-10-CM | POA: Insufficient documentation

## 2019-10-11 DIAGNOSIS — I480 Paroxysmal atrial fibrillation: Secondary | ICD-10-CM | POA: Insufficient documentation

## 2019-10-11 DIAGNOSIS — F1010 Alcohol abuse, uncomplicated: Secondary | ICD-10-CM | POA: Insufficient documentation

## 2019-10-11 DIAGNOSIS — I428 Other cardiomyopathies: Secondary | ICD-10-CM | POA: Insufficient documentation

## 2019-10-11 DIAGNOSIS — R400 Somnolence: Secondary | ICD-10-CM | POA: Diagnosis not present

## 2019-10-11 DIAGNOSIS — I110 Hypertensive heart disease with heart failure: Secondary | ICD-10-CM | POA: Insufficient documentation

## 2019-10-11 DIAGNOSIS — I5042 Chronic combined systolic (congestive) and diastolic (congestive) heart failure: Secondary | ICD-10-CM | POA: Diagnosis not present

## 2019-10-11 DIAGNOSIS — Z7901 Long term (current) use of anticoagulants: Secondary | ICD-10-CM | POA: Insufficient documentation

## 2019-10-11 DIAGNOSIS — R69 Illness, unspecified: Secondary | ICD-10-CM | POA: Diagnosis not present

## 2019-10-11 DIAGNOSIS — I5022 Chronic systolic (congestive) heart failure: Secondary | ICD-10-CM | POA: Insufficient documentation

## 2019-10-11 LAB — COMPREHENSIVE METABOLIC PANEL
ALT: 24 U/L (ref 0–44)
AST: 16 U/L (ref 15–41)
Albumin: 3.1 g/dL — ABNORMAL LOW (ref 3.5–5.0)
Alkaline Phosphatase: 73 U/L (ref 38–126)
Anion gap: 8 (ref 5–15)
BUN: 13 mg/dL (ref 6–20)
CO2: 24 mmol/L (ref 22–32)
Calcium: 9 mg/dL (ref 8.9–10.3)
Chloride: 110 mmol/L (ref 98–111)
Creatinine, Ser: 1.1 mg/dL (ref 0.61–1.24)
GFR calc Af Amer: >60 mL/min (ref >60)
GFR calc non Af Amer: >60 mL/min (ref >60)
Glucose, Bld: 132 mg/dL — ABNORMAL HIGH (ref 70–99)
Potassium: 3.4 mmol/L — ABNORMAL LOW (ref 3.5–5.1)
Sodium: 142 mmol/L (ref 135–145)
Total Bilirubin: 0.7 mg/dL (ref 0.3–1.2)
Total Protein: 6.8 g/dL (ref 6.5–8.1)

## 2019-10-11 LAB — CBC
HCT: 42.7 % (ref 39.0–52.0)
Hemoglobin: 14.8 g/dL (ref 13.0–17.0)
MCH: 28.5 pg (ref 26.0–34.0)
MCHC: 34.7 g/dL (ref 30.0–36.0)
MCV: 82.1 fL (ref 80.0–100.0)
Platelets: 251 10*3/uL (ref 150–400)
RBC: 5.2 MIL/uL (ref 4.22–5.81)
RDW: 14.3 % (ref 11.5–15.5)
WBC: 9.3 10*3/uL (ref 4.0–10.5)
nRBC: 0 % (ref 0.0–0.2)

## 2019-10-11 NOTE — Patient Instructions (Signed)
It was great to see you today! No medication changes are needed at this time.  Labs today We will only contact you if something comes back abnormal or we need to make some changes. Otherwise no news is good news! -Fasting Lipid Panel is needed 10/12/19 at your follow up  You have been referred to [facility]- [name] Address: [id number], [place] [address] Phone: [phone] - they will be in contact with you for an appointment  Your physician recommends that you schedule a follow-up appointment in: 3 months with [name]  Do the following things EVERYDAY: 1) Weigh yourself in the morning before breakfast. Write it down and keep it in a log. 2) Take your medicines as prescribed 3) Eat low salt foods-Limit salt (sodium) to 2000 mg per day.  4) Stay as active as you can everyday 5) Limit all fluids for the day to less than 2 liters   At the [facility], you and your health needs are our priority. As part of our continuing mission to provide you with exceptional heart care, we have created designated Provider Care Teams. These Care Teams include your primary Cardiologist (physician) and Advanced Practice Providers (APPs- Physician Assistants and Nurse Practitioners) who all work together to provide you with the care you need, when you need it.   You may see any of the following providers on your designated Care Team at your next follow up: Marland Kitchen Dr Glori Bickers . Dr [name] . [name], NP . Lyda Jester, PA   Please be sure to bring in all your medications bottles to every appointment.

## 2019-10-11 NOTE — Progress Notes (Signed)
Advanced Heart Failure Clinic Note   Primary Care:Dr. Scarlette Calico Primary Cardiologist: Dr. Aundra Dubin   HPI: [name] is a 53 y.o. male with a past medical history of NICM, EF 25-30% in March 2018, felt to be related to prior ETOH abuse. He also has a history of PE 03/2014 completed a years course of Xarelto, morbid obesity, OSA, and tobacco abuse.   He was admitted 11/12/15-11/14/15 with acute on chronic systolic CHF and palpitations. He wore a 30 day event monitor at discharge as he had frequent PVC's and questionable Afib on telemetry. Also with some NSVT, so he was started on Amiodaone, but at follow up had not started taking it. He had previously refused ICD and was seen inpatient by Dr. Rayann Heman who felt that his morbid obesity was a prohibitive factor.   He was seen in the clinic in April 2018. He had started drinking ETOH again. Volume status was stable, he is not an Entresto candidate due to angioedema with lisinopril. Weight was 411 pounds.   Admitted 04/12/17 with SOB, chest pain. D- dimer was 1.16, chest CT without central obstructing PE, however more peripheral and subsegmental pulmonary artery branches were not confidently evaluated due to his body habitus. He was started on a heparin gtt for presumed PE, however VQ scan showed no PE. Troponin was elevated, peaked at 3.37. LHC showed no CAD. Echo showed an EF of 15%, grade 2 DD, no pericarditis. He was diuresed with IV lasix, and started on torsemide 81m at discharge. Discharge weight was 405 pounds.   Admitted 5/22 through 04/24/2018 with abdominal pain, nausea, and vomiting. 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 Did not require surgical intervention.   Admitted 12/21-12/24/19 with severe foot pain. Uric acid was normal. CT of RLE showed degenerative changes, but no acute abnormalities. Pain improved with IV solu-medrol.  He had mild AKI thought to be secondary to NSAID use. Losartan was held for a few days and resumed at discharge. He was  discharged with prednisone taper.   He saw PCP on 11/30/18 and was started on Xarelto (has previously refused). He was given tramadol for osteoarthritis.  He returns today for post hospital follow up. Last OV was 12/2018. He reports that he has been doing fairly well. His weight has increased since last visit from 396 >>411 lb but he attributes this to being more sedentary due to the pandemic. Has been staying home more and moving less. He has chronic exertional dyspnea with moderate activity but no significant dyspnea w/ ADLs. No LEE. Reports compliance w/ BiPAP but feels drowsy often during the day. Has not seen a sleep medicine provider in many years and thinks that his device needs to be readjusted.   ECG (personally reviewed): NSR 64 bpm  Labs (5/13): K 4.1, creatinine 1.05 Labs (1/14): K 3.8, creatinine 1.16, BNP 54 Labs (2/14): K 3.7, creatinine 1.11, BNP 28 Labs (2/16): K 4.1, creatinine 1.03, LDL 96, HCT 40 Labs (3/16): K 3.7, K 1.13, BNP 367 Labs (8/16): BNP 43, K 3.5, creatinine 1.01 Labs (08/13/15): K 3.7, creatinine 1.18, HCT 41.3 Labs (12/16): K 3.7, creatinine 1.14, TGs 495, digoxin < 0.2 Labs (2/17): K 3.8, creatinine 0.99, LDL 107, TGs 222 Labs (5/17): K 4, creatinine 1.47 Labs (2/18): K 3.9, creatinine 1.06, hgb 15.1, TGs 163, LDL 89, HDL 28, TSH normal  Labs (5/18): K 3.8, creatinine 1.12.  Labs (12/30/2017): K 3.8 Creatinine 1.25  Labs (01/28/2018): K 3.8 Creatinine 1.08 Labs 03/19/2018: K 4.1 Creatinine 1/15 BNP 212  Labs 04/29/3018: Creatinine 1.1  Labs (10/19): LDL 166, K 3.8, creatinine 1.01, AST 102 => 25, ALT 125 => 37, alkaline phosphatase 233  PMH: 1. Nonischemic cardiomyopathy: Prior cath with no significant CAD.  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  Prior echoes with EF as low as 25%.  Echo (9/13) with EF 35-40%, moderate to severe LV dilation, diffuse hypokinesis, mild MR. Echo (5/15) with EF 30-35%, moderate to severe  LAE, normal RV size and systolic function.  Angioedema with ACEI, headaches with hydralazine/nitrates. Echo (3/16) with EF 25-30%, severe LV dilation, normal RV size and systolic function.  [facility] 08/13/15 showed no significant coronary disease; RA mean 6, PA 33/11 mean 23, PCWP mean 13, Fick CO/CI 4.75 /1.68 (difficult study, radial artery spasm, if needs future cath would use groin).  Echo (9/16) showed EF 20-25%.   - Echo (3/18): EF 25-30%, moderate LAE - Echo 4/18 EF 15% - Echo 7/19 EF 20-25% 2. HTN: angioedema with ACEI.  3. OSA: on Bipap 4. Morbid obesity 5. Paroxysmal atrial fibrillation: Not documented recently.   6. Smoker.  7. Anxiety/panic attacks 8. PE: 5/15, diagnosed by V/Q scan. CTA chest 8/16 negative for PE.  9. NSVT, PVCs: 30 day monitor (12/16) with PVCs, PACs, no atrial fibrillation.  - Zio patch (9/19): few short NSVT runs, no atrial fibrillation, 1.1% PVCs 10. Hematuria: Apparently had negative workup by urology.  11. ABIs (6/16) were normal 12. 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  13. Gout 14. Low back pain.   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  Has son and daughter.    FH: No premature CAD.    Review of systems complete and found to be negative unless listed in HPI.    Current Outpatient Medications  Medication Sig Dispense Refill  . b complex vitamins tablet Take 1 tablet by mouth daily at 12 noon.    . blood glucose meter kit and supplies KIT Use to check blood sugar. DX E11.9 1 each 0  . Cholecalciferol (VITAMIN D3) 1.25 MG (50000 UT) CAPS TAKE 1 CAPSULE BY MOUTH ONE TIME PER WEEK 12 capsule 0  . clonazePAM (KLONOPIN) 1 MG tablet TAKE 1 TABLET (1 MG TOTAL) BY MOUTH 2 (TWO) TIMES DAILY AS NEEDED FOR ANXIETY. 60 tablet 3  . dapagliflozin propanediol (FARXIGA) 10 MG TABS tablet Take 10 mg by mouth daily. 90 tablet 1  . Icosapent Ethyl (VASCEPA) 1 g CAPS Take 2 capsules (2 g total) by mouth 2 (two)  times daily. 360 capsule 1  . Insulin Degludec (TRESIBA FLEXTOUCH) 200 UNIT/ML SOPN Inject 50 Units into the skin daily. 9 mL 1  . Insulin Pen Needle 32G X 8 MM MISC Use as directed 100 each 0  . KLOR-CON M20 20 MEQ tablet TAKE 1 TABLET BY MOUTH EVERY DAY 90 tablet 3  . losartan (COZAAR) 50 MG tablet Take 1 tablet (50 mg total) by mouth daily. 90 tablet 3  . magnesium oxide (MAG-OX) 400 MG tablet Take 400 mg by mouth daily as needed (leg cramps).     . metoprolol succinate (TOPROL-XL) 100 MG 24 hr tablet TAKE 1 TABLET (100 MG TOTAL) BY MOUTH DAILY. TAKE WITH OR IMMEDIATELY FOLLOWING A MEAL. 90 tablet 3  . pantoprazole (PROTONIX) 40 MG tablet TAKE 1 TABLET (40 MG TOTAL) BY MOUTH DAILY. 90 tablet 1  . PRESCRIPTION MEDICATION Inhale into the lungs See admin instructions.  Bipap, pressure 16/12 with 2L of O2 - use whenever sleeping    . sildenafil (REVATIO) 20 MG tablet Take 4 tablets (80 mg total) by mouth daily as needed. 60 tablet 5  . spironolactone (ALDACTONE) 25 MG tablet TAKE 1 TABLET BY MOUTH EVERY DAY 90 tablet 3  . torsemide (DEMADEX) 20 MG tablet TAKE 2 TABLETS BY MOUTH EVERY DAY 180 tablet 1  . XARELTO 20 MG TABS tablet TAKE 1 TABLET BY MOUTH EVERY DAY 30 tablet 5   No current facility-administered medications for this encounter.     Allergies  Allergen Reactions  . Ace Inhibitors Anaphylaxis and Swelling  . Buspirone Other (See Comments)    dizziness      Social History   Socioeconomic History  . Marital status: Married    Spouse name: Not on file  . Number of children: 3  . Years of education: Not on file  . Highest education level: Not on file  Occupational History  . Occupation: DISABLED  Social Needs  . Financial resource strain: Not on file  . Food insecurity    Worry: Not on file    Inability: Not on file  . Transportation needs    Medical: Not on file    Non-medical: Not on file  Tobacco Use  . Smoking status: Current Every Day Smoker    Packs/day: 0.50     Years: 30.00    Pack years: 15.00    Types: Cigarettes  . Smokeless tobacco: Never Used  . Tobacco comment: Took information today to call for help with support to quit   Substance and Sexual Activity  . Alcohol use: Not Currently    Alcohol/week: 0.0 standard drinks    Comment: Reports none in over a month, trying to quit   . Drug use: No  . Sexual activity: Not Currently  Lifestyle  . Physical activity    Days per week: Not on file    Minutes per session: Not on file  . Stress: Not on file  Relationships  . Social Herbalist on phone: Not on file    Gets together: Not on file    Attends religious service: Not on file    Active member of club or organization: Not on file    Attends meetings of clubs or organizations: Not on file    Relationship status: Not on file  . Intimate partner violence    Fear of current or ex partner: Not on file    Emotionally abused: Not on file    Physically abused: Not on file    Forced sexual activity: Not on file  Other Topics Concern  . Not on file  Social History Narrative   He smokes about a pack per day and he has been smoking since he was 53 years of age.  He drinks alcohol occasionally, but he denies any illicit drug abuse.  He is presently on disability.    Lives with wife in a 2 story home.  Has 2 children.   Previously worked in [employer], last worked in 1998.   Highest level of education:  11th grade              Family History  Problem Relation Age of Onset  . Cancer Mother        brain tumor  . Hypertension Mother   . Diabetes Father        Deceased, 88  . Heart disease Maternal Grandmother   .  Hypertension Other        Family History  . Stroke Other        Family History  . Diabetes Other        Family History  . Diabetes Daughter     Vitals:   10/11/19 1058  BP: 138/90  Pulse: 65  SpO2: 98%  Weight: (!) 186.7 kg (411 lb 9.6 oz)   Filed Weights   10/11/19 1058  Weight: (!) 186.7 kg (411 lb 9.6 oz)    Wt Readings from Last 3 Encounters:  10/11/19 (!) 186.7 kg (411 lb 9.6 oz)  04/20/19 (!) 186.4 kg (411 lb)  04/12/19 (!) 186.4 kg (411 lb)    PHYSICAL EXAM: PHYSICAL EXAM: General:  Super morbidly obese AAM, Well appearing. No respiratory difficulty HEENT: normal Neck: thick neck. no JVD. Carotids 2+ bilat; no bruits. No lymphadenopathy or thyromegaly appreciated. Cor: PMI nondisplaced. Regular rate & rhythm. No rubs, gallops or murmurs. Lungs: clear Abdomen: soft, nontender, nondistended. No hepatosplenomegaly. No bruits or masses. Good bowel sounds. Extremities: no cyanosis, clubbing, rash, edema Neuro: alert & oriented x 3, cranial nerves grossly intact. moves all 4 extremities w/o difficulty. Affect pleasant.   ASSESSMENT & PLAN: 1. Chronic systolic CHF: Nonischemic cardiomyopathy, felt to be related to prior ETOH abuse. Echo (3/18) with EF 25-30%. He was seen by EP and decided against ICD given marked obesity.  QRS not wide enough for CRT. Echo 05/2018 with EF 20-25%.  - Stable NYHA class II symptoms. He has had progressive wt gain since last OV 10 months ago but due to being more sedentary from covid pandemic. No peripheral edema on exam. Denies resting dyspnea and no exertional dyspnea w basic ADLs. - Continue torsemide 20 mg daily. He knows to adjust dose and take an extra 20 mg tablet if wt gain >3 lb in 24 hrs or > 5 lb in 1 week  - Continue Spiro 25 mg daily - Continue Toprol XL 100 mg daily - Continue losartan 50 mg daily.   - Check BMET today. - No entresto with h/o angioedema.  - Had side effects from digoxin, does not want to take. - Headaches with Bidil, cannot take.  - We discussed low salt diet and daily wts  2. Obesity: Body mass index is 62.58 kg/m.  We discussed increasing physical activity, which he is motivated to try.   3. OSA: has BiPAP at home but notes daytime fatigue. Not followed by sleep specialist currently. May need repeat study and titration of  Bipap. Will refer to [facility] (Dr. Radford Pax or Dr. Claiborne Billings) for treatment  4. Paroxysmal atrial fibrillation: EKG shows NSR. HR controlled w/ metoprolol.   - Continue Xarelto. Check CBC today   5. PE: 04/14/2018, diagnosed by V/Q scan.  reports compliance w/ Xarelto    6. HTN - Controlled on current regimen   7. Hyperlipidemia - LDL 166 on 09/22/18 - Continue atorvastatin 80 mg - he is not fasting today. He will see PCP tomorrow and will plan to get FLP done at visit.    Follow up in 3 months with Dr Esmeralda Links 10/11/2019

## 2019-10-12 ENCOUNTER — Other Ambulatory Visit (INDEPENDENT_AMBULATORY_CARE_PROVIDER_SITE_OTHER): Payer: Medicare HMO

## 2019-10-12 ENCOUNTER — Other Ambulatory Visit: Payer: Self-pay | Admitting: Internal Medicine

## 2019-10-12 ENCOUNTER — Ambulatory Visit (INDEPENDENT_AMBULATORY_CARE_PROVIDER_SITE_OTHER): Payer: Medicare HMO | Admitting: Internal Medicine

## 2019-10-12 ENCOUNTER — Encounter: Payer: Self-pay | Admitting: Internal Medicine

## 2019-10-12 ENCOUNTER — Encounter: Payer: Self-pay | Admitting: Gastroenterology

## 2019-10-12 VITALS — BP 114/70 | HR 72 | Temp 98.1°F | Resp 16 | Ht 68.0 in | Wt >= 6400 oz

## 2019-10-12 DIAGNOSIS — E781 Pure hyperglyceridemia: Secondary | ICD-10-CM

## 2019-10-12 DIAGNOSIS — E519 Thiamine deficiency, unspecified: Secondary | ICD-10-CM | POA: Diagnosis not present

## 2019-10-12 DIAGNOSIS — Z1211 Encounter for screening for malignant neoplasm of colon: Secondary | ICD-10-CM | POA: Insufficient documentation

## 2019-10-12 DIAGNOSIS — E118 Type 2 diabetes mellitus with unspecified complications: Secondary | ICD-10-CM

## 2019-10-12 DIAGNOSIS — L732 Hidradenitis suppurativa: Secondary | ICD-10-CM | POA: Diagnosis not present

## 2019-10-12 DIAGNOSIS — E785 Hyperlipidemia, unspecified: Secondary | ICD-10-CM | POA: Insufficient documentation

## 2019-10-12 DIAGNOSIS — Z0000 Encounter for general adult medical examination without abnormal findings: Secondary | ICD-10-CM

## 2019-10-12 DIAGNOSIS — N400 Enlarged prostate without lower urinary tract symptoms: Secondary | ICD-10-CM | POA: Diagnosis not present

## 2019-10-12 LAB — URINALYSIS, ROUTINE W REFLEX MICROSCOPIC
Bilirubin Urine: NEGATIVE
Hgb urine dipstick: NEGATIVE
Ketones, ur: NEGATIVE
Nitrite: NEGATIVE
Specific Gravity, Urine: 1.025 (ref 1.000–1.030)
Total Protein, Urine: NEGATIVE
Urine Glucose: >=1000 — AB
Urobilinogen, UA: 0.2 (ref 0.0–1.0)
pH: 5.5 (ref 5.0–8.0)

## 2019-10-12 LAB — LIPID PANEL
Cholesterol: 183 mg/dL (ref 0–200)
HDL: 31.8 mg/dL — ABNORMAL LOW (ref >39.00)
NonHDL: 151.17
Total CHOL/HDL Ratio: 6
Triglycerides: 259 mg/dL — ABNORMAL HIGH (ref 0.0–149.0)
VLDL: 51.8 mg/dL — ABNORMAL HIGH (ref 0.0–40.0)

## 2019-10-12 LAB — LDL CHOLESTEROL, DIRECT: Direct LDL: 106 mg/dL

## 2019-10-12 LAB — HEMOGLOBIN A1C: Hgb A1c MFr Bld: 6 % (ref 4.6–6.5)

## 2019-10-12 LAB — PSA: PSA: 0.63 ng/mL (ref 0.10–4.00)

## 2019-10-12 MED ORDER — SAXENDA 18 MG/3ML ~~LOC~~ SOPN: 3.0000 mg | PEN_INJECTOR | Freq: Every day | SUBCUTANEOUS | 5 refills | Status: DC

## 2019-10-12 MED ORDER — DOXYCYCLINE HYCLATE 100 MG PO TABS: 100.0000 mg | ORAL_TABLET | Freq: Two times a day (BID) | ORAL | 0 refills | Status: AC

## 2019-10-12 MED ORDER — TRESIBA FLEXTOUCH 200 UNIT/ML ~~LOC~~ SOPN: 25.0000 [IU] | PEN_INJECTOR | Freq: Every day | SUBCUTANEOUS | 1 refills | Status: DC

## 2019-10-12 NOTE — Progress Notes (Signed)
Subjective:  Patient ID: [name], male    DOB: March 23, 1966  Age: 53 y.o. MRN: [id number]  CC: Annual Exam, Hyperlipidemia, and Diabetes   HPI [name] presents for a CPX.  He complains of boils in both armpits for several weeks.  He complains of fatigue but denies chest pain, shortness of breath, palpitations, or edema.  Outpatient Medications Prior to Visit  Medication Sig Dispense Refill   b complex vitamins tablet Take 1 tablet by mouth daily at 12 noon.     blood glucose meter kit and supplies KIT Use to check blood sugar. DX E11.9 1 each 0   Cholecalciferol (VITAMIN D3) 1.25 MG (50000 UT) CAPS TAKE 1 CAPSULE BY MOUTH ONE TIME PER WEEK 12 capsule 0   clonazePAM (KLONOPIN) 1 MG tablet TAKE 1 TABLET (1 MG TOTAL) BY MOUTH 2 (TWO) TIMES DAILY AS NEEDED FOR ANXIETY. 60 tablet 3   dapagliflozin propanediol (FARXIGA) 10 MG TABS tablet Take 10 mg by mouth daily. 90 tablet 1   Insulin Pen Needle 32G X 8 MM MISC Use as directed 100 each 0   magnesium oxide (MAG-OX) 400 MG tablet Take 400 mg by mouth daily as needed (leg cramps).      metoprolol succinate (TOPROL-XL) 100 MG 24 hr tablet TAKE 1 TABLET (100 MG TOTAL) BY MOUTH DAILY. TAKE WITH OR IMMEDIATELY FOLLOWING A MEAL. 90 tablet 3   pantoprazole (PROTONIX) 40 MG tablet TAKE 1 TABLET (40 MG TOTAL) BY MOUTH DAILY. 90 tablet 1   PRESCRIPTION MEDICATION Inhale into the lungs See admin instructions. Bipap, pressure 16/12 with 2L of O2 - use whenever sleeping     sildenafil (REVATIO) 20 MG tablet Take 4 tablets (80 mg total) by mouth daily as needed. 60 tablet 5   spironolactone (ALDACTONE) 25 MG tablet TAKE 1 TABLET BY MOUTH EVERY DAY 90 tablet 3   torsemide (DEMADEX) 20 MG tablet TAKE 2 TABLETS BY MOUTH EVERY DAY 180 tablet 1   XARELTO 20 MG TABS tablet TAKE 1 TABLET BY MOUTH EVERY DAY 30 tablet 5   Icosapent Ethyl (VASCEPA) 1 g CAPS Take 2 capsules (2 g total) by mouth 2 (two) times daily. 360 capsule 1    Insulin Degludec (TRESIBA FLEXTOUCH) 200 UNIT/ML SOPN Inject 50 Units into the skin daily. 9 mL 1   KLOR-CON M20 20 MEQ tablet TAKE 1 TABLET BY MOUTH EVERY DAY 90 tablet 3   losartan (COZAAR) 50 MG tablet Take 1 tablet (50 mg total) by mouth daily. 90 tablet 3   No facility-administered medications prior to visit.     ROS Review of Systems  Constitutional: Positive for fatigue. Negative for appetite change, chills, fever and unexpected weight change.  HENT: Negative.   Eyes: Negative for visual disturbance.  Respiratory: Positive for apnea. Negative for cough, shortness of breath and wheezing.   Cardiovascular: Negative for chest pain, palpitations and leg swelling.  Gastrointestinal: Negative for abdominal pain, blood in stool, constipation, diarrhea, nausea and vomiting.  Endocrine: Negative.   Genitourinary: Negative.  Negative for difficulty urinating, dysuria, penile swelling, scrotal swelling, testicular pain and urgency.  Musculoskeletal: Negative.  Negative for arthralgias and myalgias.  Skin: Positive for color change. Negative for rash.  Neurological: Negative for dizziness, weakness, light-headedness and headaches.  Hematological: Negative for adenopathy. Does not bruise/bleed easily.  Psychiatric/Behavioral: Positive for sleep disturbance. Negative for confusion, decreased concentration, dysphoric mood, self-injury and suicidal ideas. The patient is nervous/anxious.     Objective:  BP  114/70 (BP Location: Left Arm, Patient Position: Sitting, Cuff Size: Large)    Pulse 72    Temp 98.1 F (36.7 C) (Oral)    Resp 16    Ht 5' 8"  (1.727 m)    Wt (!) 411 lb (186.4 kg)    SpO2 97%    BMI 62.49 kg/m   BP Readings from Last 3 Encounters:  10/12/19 114/70  10/11/19 138/90  04/20/19 116/68    Wt Readings from Last 3 Encounters:  10/12/19 (!) 411 lb (186.4 kg)  10/11/19 (!) 411 lb 9.6 oz (186.7 kg)  04/20/19 (!) 411 lb (186.4 kg)    Physical Exam Vitals signs reviewed.    Constitutional:      General: He is not in acute distress.    Appearance: He is obese. He is ill-appearing. He is not toxic-appearing or diaphoretic.  HENT:     Nose: Nose normal.     Mouth/Throat:     Mouth: Mucous membranes are moist.  Eyes:     General: No scleral icterus.    Conjunctiva/sclera: Conjunctivae normal.  Neck:     Musculoskeletal: Neck supple.  Cardiovascular:     Rate and Rhythm: Normal rate and regular rhythm.     Heart sounds: No murmur.  Pulmonary:     Effort: Pulmonary effort is normal.     Breath sounds: No stridor. No wheezing, rhonchi or rales.  Abdominal:     General: Abdomen is protuberant. Bowel sounds are normal. There is no distension.     Palpations: Abdomen is soft. There is no hepatomegaly or splenomegaly.     Tenderness: There is no abdominal tenderness.     Hernia: There is no hernia in the left inguinal area or right inguinal area.  Genitourinary:    Pubic Area: No rash.      Penis: Normal and circumcised. No discharge, swelling or lesions.      Scrotum/Testes: Normal.        Right: Mass, tenderness or swelling not present.        Left: Mass, tenderness or swelling not present.     Epididymis:     Right: Normal. Not inflamed or enlarged. No mass.     Left: Not inflamed or enlarged. No mass.     Prostate: Enlarged (2+ smooth symm BPH). Not tender and no nodules present.     Rectum: Normal. Guaiac result negative. No mass, tenderness, anal fissure, external hemorrhoid or internal hemorrhoid. Normal anal tone.  Musculoskeletal: Normal range of motion.     Right lower leg: No edema.     Left lower leg: No edema.  Lymphadenopathy:     Cervical: No cervical adenopathy.     Lower Body: No right inguinal adenopathy. No left inguinal adenopathy.  Skin:    General: Skin is warm and dry.     Comments: In both axilla, more prominently on the left than the right, there are tracts of induration, hyperpigmentation, and subcutaneous fluctuance.  Some of  these tracts measure up to several centimeters.  There are no formed areas of abscess.  Neurological:     General: No focal deficit present.  Psychiatric:        Mood and Affect: Mood normal.        Behavior: Behavior normal.     Lab Results  Component Value Date   WBC 9.3 10/11/2019   HGB 14.8 10/11/2019   HCT 42.7 10/11/2019   PLT 251 10/11/2019   GLUCOSE 132 (H) 10/11/2019  CHOL 183 10/12/2019   TRIG 259.0 (H) 10/12/2019   HDL 31.80 (L) 10/12/2019   LDLDIRECT 106.0 10/12/2019   LDLCALC 128 (H) 12/07/2018   ALT 24 10/11/2019   AST 16 10/11/2019   NA 142 10/11/2019   K 3.4 (L) 10/11/2019   CL 110 10/11/2019   CREATININE 1.10 10/11/2019   BUN 13 10/11/2019   CO2 24 10/11/2019   TSH 1.54 01/19/2019   PSA 0.63 10/12/2019   INR 1.1 (H) 04/12/2019   HGBA1C 6.0 10/12/2019   MICROALBUR 0.9 01/19/2019    No results found.  Assessment & Plan:   [name] was seen today for annual exam, hyperlipidemia and diabetes.  Diagnoses and all orders for this visit:  Type II diabetes mellitus with manifestations ([place])- His A1c is down to 6.0%.  He will be using Saxenda to treat obesity so I recommended that he decrease his basal insulin dose by 50%. -     Hemoglobin A1c; Future -     HM Diabetes Foot Exam -     Insulin Degludec (TRESIBA FLEXTOUCH) 200 UNIT/ML SOPN; Inject 26 Units into the skin daily.  Hypertriglyceridemia- Will continue icosapent ethyl and will continue to work on obesity and other lifestyle modifications. -     Cancel: Triglycerides; Future -     Lipid panel; Future -     Insulin Degludec (TRESIBA FLEXTOUCH) 200 UNIT/ML SOPN; Inject 26 Units into the skin daily.  Thiamin deficiency- I will monitor his thiamine level. -     Vitamin B1; Future  Hyperlipidemia with target LDL less than 100- He has achieved his LDL goal and is doing well on the statin. -     Lipid panel; Future  Screen for colon cancer -     Ambulatory referral to  Gastroenterology  Hidradenitis suppurativa of left axilla -     doxycycline (VIBRA-TABS) 100 MG tablet; Take 1 tablet (100 mg total) by mouth 2 (two) times daily for 21 days.  Benign prostatic hyperplasia without lower urinary tract symptoms- His PSA is low which is reassuring that he does not have prostate cancer.  He has no symptoms that need to be treated. -     Urinalysis, Routine w reflex microscopic; Future -     PSA; Future  Morbid obesity (HCC) -     Liraglutide -Weight Management (SAXENDA) 18 MG/3ML SOPN; Inject 3 mg into the skin daily.   I have changed [name]'s Insulin Degludec to [employer]. I am also having him start on doxycycline, Saxenda, and atorvastatin. Additionally, I am having him maintain his magnesium oxide, blood glucose meter kit and supplies, PRESCRIPTION MEDICATION, b complex vitamins, losartan, Insulin Pen Needle, sildenafil, dapagliflozin propanediol, metoprolol succinate, torsemide, pantoprazole, spironolactone, Vitamin D3, clonazePAM, Xarelto, and Vascepa.  Meds ordered this encounter  Medications   doxycycline (VIBRA-TABS) 100 MG tablet    Sig: Take 1 tablet (100 mg total) by mouth 2 (two) times daily for 21 days.    Dispense:  42 tablet    Refill:  0   Liraglutide -Weight Management (SAXENDA) 18 MG/3ML SOPN    Sig: Inject 3 mg into the skin daily.    Dispense:  5 pen    Refill:  5   Insulin Degludec (TRESIBA FLEXTOUCH) 200 UNIT/ML SOPN    Sig: Inject 26 Units into the skin daily.    Dispense:  9 mL    Refill:  1   Icosapent Ethyl (VASCEPA) 1 g CAPS    Sig: Take 2  capsules (2 g total) by mouth 2 (two) times daily.    Dispense:  360 capsule    Refill:  1   atorvastatin (LIPITOR) 40 MG tablet    Sig: Take 1 tablet (40 mg total) by mouth daily.    Dispense:  90 tablet    Refill:  1     Follow-up: Return in about 4 months (around 02/09/2020).  Scarlette Calico, MD

## 2019-10-12 NOTE — Patient Instructions (Signed)

## 2019-10-13 ENCOUNTER — Telehealth (HOSPITAL_COMMUNITY): Payer: Self-pay | Admitting: Cardiology

## 2019-10-13 DIAGNOSIS — I5042 Chronic combined systolic (congestive) and diastolic (congestive) heart failure: Secondary | ICD-10-CM

## 2019-10-13 MED ORDER — POTASSIUM CHLORIDE CRYS ER 20 MEQ PO TBCR: 40.0000 meq | EXTENDED_RELEASE_TABLET | Freq: Every day | ORAL | 3 refills | Status: DC

## 2019-10-13 MED ORDER — ATORVASTATIN CALCIUM 40 MG PO TABS: 40.0000 mg | ORAL_TABLET | Freq: Every day | ORAL | 1 refills | Status: DC

## 2019-10-13 MED ORDER — VASCEPA 1 G PO CAPS: 2.0000 | ORAL_CAPSULE | Freq: Two times a day (BID) | ORAL | 1 refills | Status: DC

## 2019-10-13 NOTE — Telephone Encounter (Signed)
-----   Message from Consuelo Pandy, [place] sent at 10/11/2019  8:15 PM EST ----- K is low. Increase kdur to 40 meq daily. Renal function normal and CBC normal. Plan to repeat BMP in 1 week to recheck K level

## 2019-10-13 NOTE — Telephone Encounter (Signed)
Notes recorded by Kerry Dory, CMA on 10/13/2019 at 9:57 AM EST  Patient aware. Patient voiced understanding   ------   Notes recorded by Consuelo Pandy, PA-C on 10/11/2019 at 8:15 PM EST  K is low. Increase kdur to 40 meq daily. Renal function normal and CBC normal. Plan to repeat BMP in 1 week to recheck K level

## 2019-10-13 NOTE — Assessment & Plan Note (Signed)
Exam completed. Labs reviewed. He refused a flu vaccine and pneumonia vaccine today. He is referred for colon cancer screening. Patient education was given.

## 2019-10-16 LAB — VITAMIN B1: Vitamin B1 (Thiamine): 9 nmol/L (ref 8–30)

## 2019-10-20 ENCOUNTER — Other Ambulatory Visit (HOSPITAL_COMMUNITY): Payer: Self-pay | Admitting: Cardiology

## 2019-10-20 ENCOUNTER — Other Ambulatory Visit: Payer: Self-pay

## 2019-10-20 ENCOUNTER — Ambulatory Visit (HOSPITAL_COMMUNITY)
Admission: RE | Admit: 2019-10-20 | Discharge: 2019-10-20 | Disposition: A | Discharge status: Home / Self Care | Payer: Medicare HMO | Source: Ambulatory Visit | Attending: Internal Medicine | Admitting: Internal Medicine

## 2019-10-20 DIAGNOSIS — I5042 Chronic combined systolic (congestive) and diastolic (congestive) heart failure: Secondary | ICD-10-CM | POA: Insufficient documentation

## 2019-10-20 LAB — BASIC METABOLIC PANEL WITH GFR
Anion gap: 9 (ref 5–15)
BUN: 15 mg/dL (ref 6–20)
CO2: 27 mmol/L (ref 22–32)
Calcium: 9.3 mg/dL (ref 8.9–10.3)
Chloride: 105 mmol/L (ref 98–111)
Creatinine, Ser: 1.21 mg/dL (ref 0.61–1.24)
GFR calc Af Amer: >60 mL/min
GFR calc non Af Amer: >60 mL/min
Glucose, Bld: 152 mg/dL — ABNORMAL HIGH (ref 70–99)
Potassium: 3.3 mmol/L — ABNORMAL LOW (ref 3.5–5.1)
Sodium: 141 mmol/L (ref 135–145)

## 2019-10-25 ENCOUNTER — Other Ambulatory Visit: Payer: Self-pay

## 2019-10-25 ENCOUNTER — Ambulatory Visit (HOSPITAL_COMMUNITY)
Admission: RE | Admit: 2019-10-25 | Discharge: 2019-10-25 | Disposition: A | Discharge status: Home / Self Care | Payer: Medicare HMO | Source: Ambulatory Visit | Attending: Internal Medicine | Admitting: Internal Medicine

## 2019-10-25 ENCOUNTER — Other Ambulatory Visit (HOSPITAL_COMMUNITY): Payer: Self-pay

## 2019-10-25 DIAGNOSIS — I5042 Chronic combined systolic (congestive) and diastolic (congestive) heart failure: Secondary | ICD-10-CM | POA: Diagnosis not present

## 2019-10-25 LAB — BASIC METABOLIC PANEL
Anion gap: 11 (ref 5–15)
BUN: 16 mg/dL (ref 6–20)
CO2: 25 mmol/L (ref 22–32)
Calcium: 9.6 mg/dL (ref 8.9–10.3)
Chloride: 102 mmol/L (ref 98–111)
Creatinine, Ser: 1.32 mg/dL — ABNORMAL HIGH (ref 0.61–1.24)
GFR calc Af Amer: >60 mL/min (ref >60)
GFR calc non Af Amer: >60 mL/min (ref >60)
Glucose, Bld: 106 mg/dL — ABNORMAL HIGH (ref 70–99)
Potassium: 3.7 mmol/L (ref 3.5–5.1)
Sodium: 138 mmol/L (ref 135–145)

## 2019-10-26 DIAGNOSIS — G4733 Obstructive sleep apnea (adult) (pediatric): Secondary | ICD-10-CM | POA: Diagnosis not present

## 2019-11-01 ENCOUNTER — Telehealth: Payer: Self-pay | Admitting: *Deleted

## 2019-11-01 DIAGNOSIS — I10 Essential (primary) hypertension: Secondary | ICD-10-CM | POA: Diagnosis not present

## 2019-11-01 DIAGNOSIS — I509 Heart failure, unspecified: Secondary | ICD-10-CM | POA: Diagnosis not present

## 2019-11-01 DIAGNOSIS — G4733 Obstructive sleep apnea (adult) (pediatric): Secondary | ICD-10-CM | POA: Diagnosis not present

## 2019-11-01 NOTE — Telephone Encounter (Signed)
Patient has an appointment for Friday 11/04/19 at 10 am.

## 2019-11-01 NOTE — Telephone Encounter (Deleted)
3. OSA: has BiPAP at home but notes daytime fatigue. Not followed by sleep specialist currently. May need repeat study and titration of Bipap. Will refer to [facility] (Dr. Radford Pax or Dr. Claiborne Billings) for treatment

## 2019-11-01 NOTE — Telephone Encounter (Signed)
RE: Appointment [name], MD  Freada Bergeron, CMA        Get in with Dr. Claiborne Billings or myself at next available [place]

## 2019-11-01 NOTE — Telephone Encounter (Signed)
-----   Message from Rivka Barbara sent at 10/26/2019 11:03 AM EST ----- Regarding: Appointment Patient has a new patient referral in the system to see Dr. Radford Pax for sleep

## 2019-11-04 ENCOUNTER — Telehealth: Payer: Self-pay

## 2019-11-04 ENCOUNTER — Telehealth: Payer: Self-pay | Admitting: *Deleted

## 2019-11-04 ENCOUNTER — Encounter: Payer: Self-pay | Admitting: Cardiology

## 2019-11-04 ENCOUNTER — Other Ambulatory Visit: Payer: Self-pay

## 2019-11-04 ENCOUNTER — Telehealth (INDEPENDENT_AMBULATORY_CARE_PROVIDER_SITE_OTHER): Payer: Medicare HMO | Admitting: Cardiology

## 2019-11-04 VITALS — BP 127/77 | HR 78 | Ht 68.5 in | Wt 396.0 lb

## 2019-11-04 DIAGNOSIS — G4733 Obstructive sleep apnea (adult) (pediatric): Secondary | ICD-10-CM | POA: Diagnosis not present

## 2019-11-04 DIAGNOSIS — I10 Essential (primary) hypertension: Secondary | ICD-10-CM | POA: Diagnosis not present

## 2019-11-04 NOTE — Telephone Encounter (Signed)
Patient is on O2 at 2L via BiPAP which is managed by [facility] has been cancelled and he has switched his O2 to [employer].    Order has been placed for a new Respironics BiPAP with heated humidity and mask of choice on auto BIPAP with IPAP max 20cm H2O, EPAP min 6 cm H2O and PS of 5cm H2O.  I will get a download in 2 weeks and he will see me back 8-10 weeks

## 2019-11-04 NOTE — Telephone Encounter (Addendum)
-----   Message from [name], MD sent at 11/04/2019 10:23 AM EST ----- [name]  Patient has been using [facility] and his device is > 51 years old.  The SD card is not working there is no way to get a download.  He is also on O2 at 2L via BiPAP which is managed by [employer] but he is very frustrated with them as they do not service his O2 but charge him monthly. He would like to switch his O2 to [facility]  so please call [facility] and cancel his O2 with them and then order 2L O2 via BiPAP through [facility].   I am going to order a new Respironics BiPAP with heated humidity and mask of choice on auto BIPAP with IPAP max 20cm H2O, EPAP min 6 cm H2O and PS of 5cm H2O.  I will get a download in 2 weeks and he will see me back 8-10 weeks after that.  He will call me with any issues in the mean time.

## 2019-11-04 NOTE — Telephone Encounter (Signed)
[facility] will send the forms for patient to sign to his home and once received back they will contact [employer] to switch over the oxygen.

## 2019-11-04 NOTE — Progress Notes (Signed)
Virtual Visit via Telephone Note   This visit type was conducted due to national recommendations for restrictions regarding the COVID-19 Pandemic (e.g. social distancing) in an effort to limit this patient's exposure and mitigate transmission in our community.  Due to his co-morbid illnesses, this patient is at least at moderate risk for complications without adequate follow up.  This format is felt to be most appropriate for this patient at this time.  All issues noted in this document were discussed and addressed.  A limited physical exam was performed with this format.  Please refer to the patient's chart for his consent to telehealth for [facility].   Evaluation Performed:  Follow-up visit  This visit type was conducted due to national recommendations for restrictions regarding the COVID-19 Pandemic (e.g. social distancing).  This format is felt to be most appropriate for this patient at this time.  All issues noted in this document were discussed and addressed.  No physical exam was performed (except for noted visual exam findings with Video Visits).  Please refer to the patient's chart (MyChart message for video visits and phone note for telephone visits) for the patient's consent to telehealth for [facility].  Date:  11/04/2019   ID:  [name], DOB 12-22-1965, MRN [id number]  Patient Location:  Home  Provider location:   [place]  PCP:  [name], MD  Sleep medicine:  Fransico Him, MD (NEW) Electrophysiologist:  None   Chief Complaint:  OSA  History of Present Illness:    [name] is a 53 y.o. male who presents via audio/video conferencing for a telehealth visit today.    This is a 53yo morbidly obese AAM with a hx of NICM suspected to be ETOH related, HTN, PAF and hx of OSA and has been on BiPAP.  He is referred to me to establish sleep care.  I do not have any records of his last sleep study or BiPAP titration.  He tells me that his last sleep study  showed no REM sleep and an AHI of > 70/hr.  He was initially started on CPAP and then changed to BiPAP.  He has had several sleep studies done in [place] and then [place] and he thinks also in [place].   He uses [facility].  He says that he has no modem on the machine to monitor his compliance and his PAP machine is > 39 years old.  He is also on 2L of O2 with his BiPAP.  He recently got a new mask that was not the correct mask and just got another mask in the mail yesterday.  He is also frustrated that his O2 comes from [facility] in [place] and they have not been servicing the O2 and is very frustrated with them.  They charge him but never service his O2 device.  He would like to switch O2 supplier to [facility].  He feels the pressure is not as much as it was before.  Lately he has felt more fatigued in the am and sleepy throughout the day.  He has been waking up with headaches in the am as well.  He occasionally has some mouth.    The patient does not have symptoms concerning for COVID-19 infection (fever, chills, cough, or new shortness of breath).   Prior CV studies:   The following studies were reviewed today:  none  Past Medical History:  Diagnosis Date  . Alcohol abuse   . Anxiety state, unspecified   .  Atrial fibrillation ([place])   . CHF (congestive heart failure) ([place])   . Chronic systolic heart failure ([place])   . Diabetes mellitus, type II ([place])   . Edema   . Gout   . History of medication noncompliance   . Migraine   . Obesity, unspecified   . Obstructive sleep apnea   . Psychiatric disorder   . Pulmonary embolism ([place])   . Shortness of breath    Past Surgical History:  Procedure Laterality Date  . CARDIAC CATHETERIZATION    . CARDIAC CATHETERIZATION N/A 08/13/2015   Procedure: Right/Left Heart Cath and Coronary Angiography;  Surgeon: [name], MD;  Location: [place] [facility];  Service: Cardiovascular;  Laterality: N/A;  . RIGHT/LEFT HEART CATH AND  CORONARY ANGIOGRAPHY N/A 04/14/2017   Procedure: Right/Left Heart Cath and Coronary Angiography;  Surgeon: [name], MD;  Location: [place] [facility];  Service: Cardiovascular;  Laterality: N/A;  . TESTICLE SURGERY       Current Meds  Medication Sig  . atorvastatin (LIPITOR) 40 MG tablet Take 1 tablet (40 mg total) by mouth daily.  Marland Kitchen b complex vitamins tablet Take 1 tablet by mouth daily at 12 noon.  . blood glucose meter kit and supplies KIT Use to check blood sugar. DX E11.9  . Cholecalciferol (VITAMIN D3) 1.25 MG (50000 UT) CAPS TAKE 1 CAPSULE BY MOUTH ONE TIME PER WEEK  . clonazePAM (KLONOPIN) 1 MG tablet TAKE 1 TABLET (1 MG TOTAL) BY MOUTH 2 (TWO) TIMES DAILY AS NEEDED FOR ANXIETY.  . dapagliflozin propanediol (FARXIGA) 10 MG TABS tablet Take 10 mg by mouth daily.  Vanessa Kick Ethyl (VASCEPA) 1 g CAPS Take 2 capsules (2 g total) by mouth 2 (two) times daily.  . Insulin Degludec (TRESIBA FLEXTOUCH) 200 UNIT/ML SOPN Inject 26 Units into the skin daily. (Patient taking differently: Inject 25 Units into the skin daily as needed. )  . Insulin Pen Needle 32G X 8 MM MISC Use as directed  . losartan (COZAAR) 50 MG tablet TAKE 1 TABLET BY MOUTH EVERY DAY  . magnesium oxide (MAG-OX) 400 MG tablet Take 400 mg by mouth daily as needed (leg cramps).   . metoprolol succinate (TOPROL-XL) 100 MG 24 hr tablet TAKE 1 TABLET (100 MG TOTAL) BY MOUTH DAILY. TAKE WITH OR IMMEDIATELY FOLLOWING A MEAL.  . pantoprazole (PROTONIX) 40 MG tablet TAKE 1 TABLET (40 MG TOTAL) BY MOUTH DAILY.  Marland Kitchen potassium chloride SA (KLOR-CON M20) 20 MEQ tablet Take 2 tablets (40 mEq total) by mouth daily.  Marland Kitchen PRESCRIPTION MEDICATION Inhale into the lungs See admin instructions. Bipap, pressure 16/12 with 2L of O2 - use whenever sleeping  . sildenafil (REVATIO) 20 MG tablet Take 4 tablets (80 mg total) by mouth daily as needed.  Marland Kitchen spironolactone (ALDACTONE) 25 MG tablet TAKE 1 TABLET BY MOUTH EVERY DAY  . torsemide (DEMADEX) 20  MG tablet TAKE 2 TABLETS BY MOUTH EVERY DAY (Patient taking differently: As directed)  . XARELTO 20 MG TABS tablet TAKE 1 TABLET BY MOUTH EVERY DAY     Allergies:   Ace inhibitors and Buspirone   Social History   Tobacco Use  . Smoking status: Current Every Day Smoker    Packs/day: 0.50    Years: 30.00    Pack years: 15.00    Types: Cigarettes  . Smokeless tobacco: Never Used  . Tobacco comment: Took information today to call for help with support to quit   Substance Use Topics  .  Alcohol use: Not Currently    Alcohol/week: 0.0 standard drinks    Comment: Reports none in over a month, trying to quit   . Drug use: No     Family Hx: The patient's family history includes Cancer in his mother; Diabetes in his daughter, father, and another family member; Heart disease in his maternal grandmother; Hypertension in his mother and another family member; Stroke in an other family member.  ROS:   Please see the history of present illness.     All other systems reviewed and are negative.   Labs/Other Tests and Data Reviewed:    Recent Labs: 11/20/2018: B Natriuretic Peptide 76.0 01/19/2019: TSH 1.54 04/12/2019: Magnesium 1.8 10/11/2019: ALT 24; Hemoglobin 14.8; Platelets 251 10/25/2019: BUN 16; Creatinine, Ser 1.32; Potassium 3.7; Sodium 138   Recent Lipid Panel Lab Results  Component Value Date/Time   CHOL 183 10/12/2019 11:58 AM   TRIG 259.0 (H) 10/12/2019 11:58 AM   HDL 31.80 (L) 10/12/2019 11:58 AM   CHOLHDL 6 10/12/2019 11:58 AM   LDLCALC 128 (H) 12/07/2018 04:14 PM   LDLDIRECT 106.0 10/12/2019 11:58 AM    Wt Readings from Last 3 Encounters:  11/04/19 (!) 396 lb (179.6 kg)  10/12/19 (!) 411 lb (186.4 kg)  10/11/19 (!) 411 lb 9.6 oz (186.7 kg)     Objective:    Vital Signs:  BP 127/77   Pulse 78   Ht 5' 8.5" (1.74 m)   Wt (!) 396 lb (179.6 kg)   BMI 59.34 kg/m    ASSESSMENT & PLAN:    1.  OSA -he has a hx of severe OSA with an AHI > 70/hr.  He has been on  BiPAP for years and was followed by Dr. Gwenette Greet until 2016 when Dr. Gwenette Greet left.  He has been using [facility] and his device is > 53 years old.  The SD card is not working there is no way to get a download.  He is also on O2 at 2L via BiPAP which is managed by [employer] but he is very frustrated with them as they do not service his O2 but charge him monthly. He would like to switch his O2 to [facility] which we will arrange.  I am going to order a new Respironics BiPAP with heated humidity and mask of choice on auto BIPAP with IPAP max 20cm H2O, EPAP min 6 cm H2O and PS of 5cm H2O.  I will get a download in 2 weeks and he will see me back 8-10 weeks after that.  He will call me with any issues in the mean time.   2.  HTN -BP controlled -continue Losartan 65m daily, Toprol XL 1028mdaily and spiro 2556maily  3.  Morbid Obesity -his weight is down and he is working on diet and exercise -he has lost 15lbs since November OV in HF clinic  COVID-19 Education: The signs and symptoms of COVID-19 were discussed with the patient and how to seek care for testing (follow up with PCP or arrange E-visit).  The importance of social distancing was discussed today.  Patient Risk:   After full review of this patient's clinical status, I feel that they are at least moderate risk at this time.  Time:   Today, I have spent 20 minutes directly with the patient on telemedicine discussing medical problems including OSA, HTN, .  We also reviewed the symptoms of COVID 19 and the ways to protect against contracting the virus with  Hospital doctor.  I spent an additional 5 minutes reviewing patient's chart including OSA, morbid obesity and HTN.  Medication Adjustments/Labs and Tests Ordered: Current medicines are reviewed at length with the patient today.  Concerns regarding medicines are outlined above.  Tests Ordered: No orders of the defined types were placed in this encounter.  Medication Changes: No  orders of the defined types were placed in this encounter.   Disposition:  Follow up in 10 week(s) from start of new PAP device  Signed, Fransico Him, MD  11/04/2019 10:03 AM    [place]

## 2019-11-04 NOTE — Patient Instructions (Addendum)
Medication Instructions:  Your physician recommends that you continue on your current medications as directed. Please refer to the Current Medication list given to you today.  *If you need a refill on your cardiac medications before your next appointment, please call your pharmacy*  Follow-Up: At [facility], you and your health needs are our priority.  As part of our continuing mission to provide you with exceptional heart care, we have created designated Provider Care Teams.  These Care Teams include your primary Cardiologist (physician) and Advanced Practice Providers (APPs -  Physician Assistants and Nurse Practitioners) who all work together to provide you with the care you need, when you need it.  Follow up with Dr. Radford Pax 10 weeks after starting new PAP device.   Other Instructions Dr. Theodosia Blender assistant, Gae Bon, will be in contact with you regarding new orders for PAP and follow-up.

## 2019-11-04 NOTE — Telephone Encounter (Signed)

## 2019-11-15 ENCOUNTER — Encounter: Payer: Self-pay | Admitting: Gastroenterology

## 2019-11-15 ENCOUNTER — Ambulatory Visit (INDEPENDENT_AMBULATORY_CARE_PROVIDER_SITE_OTHER): Payer: Medicare HMO | Admitting: Gastroenterology

## 2019-11-15 VITALS — BP 104/68 | HR 72 | Ht 71.0 in | Wt >= 6400 oz

## 2019-11-15 DIAGNOSIS — I428 Other cardiomyopathies: Secondary | ICD-10-CM | POA: Diagnosis not present

## 2019-11-15 DIAGNOSIS — Z1211 Encounter for screening for malignant neoplasm of colon: Secondary | ICD-10-CM

## 2019-11-15 NOTE — Telephone Encounter (Signed)
Patient DOES NOT want to stay with the same DME for his 02.

## 2019-11-15 NOTE — Patient Instructions (Signed)
If you are age 51 or older, your body mass index should be between 23-30. Your Body mass index is 56.07 kg/m. If this is out of the aforementioned range listed, please consider follow up with your Primary Care Provider.  If you are age 46 or younger, your body mass index should be between 19-25. Your Body mass index is 56.07 kg/m. If this is out of the aformentioned range listed, please consider follow up with your Primary Care Provider.   It was a pleasure to see you today!  Dr. Loletha Carrow

## 2019-11-15 NOTE — Telephone Encounter (Signed)
Pleaese find out if he needs an overnight oximetry on CPAP with no O2 before we can order O2

## 2019-11-15 NOTE — Telephone Encounter (Signed)
RE: Bipap and 02 order sent to [facility], Eber Hong, MD  Freada Bergeron, CMA  Please order a split night sleep study and we will go from there. Please ask patient if he wants to keep same DME for his O2   Traci

## 2019-11-15 NOTE — Telephone Encounter (Signed)
[name], the only study that I can find in Epic is a baseline sleep study from 2001. There is nothing that qualifies the patient for a BIPAP (titration study). He will need to have a study done starting with CPAP, failing that and then moving to BIPAP or he will need to stay with the current company he is with for the Replacement BIPAP, unless he is willing to private pay.

## 2019-11-15 NOTE — Telephone Encounter (Signed)
Split night study sent to sleep pool.

## 2019-11-15 NOTE — Progress Notes (Signed)
z        [facility] Consult Note:  History: [name] 11/15/2019  Referring provider: [name], MD  Reason for consult/chief complaint: Colon Cancer Screening (patient is on Xarelto) and Bloated   Subjective  HPI:  This is a 53 year old man referred by primary care for consideration of colon cancer screening. He has no family history of colon cancer and has had no prior screening. Extensive review of chart was performed given his multiple complex medical issues.  Reviewed primary care and cardiology notes and test results.  Recent Cardiology office note reviewed - outlines this patient's complex cardiac condition. Essentially, he has severe nonischemic cardiomyopathy felt likely to be from prior alcohol abuse.  Most recent ejection fraction 25%.  Patient was considered for AICD due to low EF, but apparently not a candidate for that due to his morbid obesity. He has obstructive sleep apnea requiring BiPAP and supplemental oxygen at night. A. fib on chronic oral anticoagulation.  He denies chronic abdominal pain, constipation or rectal bleeding.  Intermittent bloating and gas that he feels are probably diet related.  ROS:  Review of Systems  Constitutional: Positive for fatigue. Negative for appetite change and unexpected weight change.  HENT: Negative for mouth sores and voice change.   Eyes: Negative for pain and redness.  Respiratory: Positive for shortness of breath. Negative for cough.   Cardiovascular: Positive for leg swelling. Negative for chest pain and palpitations.  Genitourinary: Negative for dysuria and hematuria.  Musculoskeletal: Negative for arthralgias and myalgias.  Skin: Negative for pallor and rash.  Neurological: Negative for weakness and headaches.  Hematological: Negative for adenopathy.  Psychiatric/Behavioral:       Anxiety     Past Medical History: Past Medical History:  Diagnosis Date  . Alcohol abuse   . Anxiety  state, unspecified   . Atrial fibrillation ([place])   . CHF (congestive heart failure) ([place])   . Chronic systolic heart failure ([place])   . Diabetes mellitus, type II ([place])   . Edema   . Gout   . History of medication noncompliance   . Migraine   . Obesity, unspecified   . Obstructive sleep apnea   . Psychiatric disorder   . Pulmonary embolism ([place])   . Shortness of breath      Past Surgical History: Past Surgical History:  Procedure Laterality Date  . CARDIAC CATHETERIZATION    . CARDIAC CATHETERIZATION N/A 08/13/2015   Procedure: Right/Left Heart Cath and Coronary Angiography;  Surgeon: [name], MD;  Location: [facility];  Service: Cardiovascular;  Laterality: N/A;  . RIGHT/LEFT HEART CATH AND CORONARY ANGIOGRAPHY N/A 04/14/2017   Procedure: Right/Left Heart Cath and Coronary Angiography;  Surgeon: [name], MD;  Location: [facility];  Service: Cardiovascular;  Laterality: N/A;  . TESTICLE SURGERY       Family History: Family History  Problem Relation Age of Onset  . Cancer Mother        brain tumor  . Hypertension Mother   . Diabetes Father        Deceased, 21  . Heart disease Maternal Grandmother   . Hypertension Other        Family History  . Stroke Other        Family History  . Diabetes Other        Family History  . Diabetes Daughter     Social History: Social History   Socioeconomic History  . Marital  status: Married    Spouse name: Not on file  . Number of children: 3  . Years of education: Not on file  . Highest education level: Not on file  Occupational History  . Occupation: DISABLED  Tobacco Use  . Smoking status: Current Every Day Smoker    Packs/day: 0.50    Years: 30.00    Pack years: 15.00    Types: Cigarettes  . Smokeless tobacco: Never Used  Substance and Sexual Activity  . Alcohol use: Not Currently    Alcohol/week: 0.0 standard drinks  . Drug use: No  . Sexual activity: Not Currently  Other Topics  Concern  . Not on file  Social History Narrative   He smokes about a pack per day and he has been smoking since he was 53 years of age.  He drinks alcohol occasionally, but he denies any illicit drug abuse.  He is presently on disability.    Lives with wife in a 2 story home.  Has 2 children.   Previously worked in [employer], last worked in 1998.   Highest level of education:  11th grade           Social Determinants of Health   Financial Resource Strain:   . Difficulty of Paying Living Expenses: Not on file  Food Insecurity:   . Worried About Charity fundraiser in the Last Year: Not on file  . Ran Out of Food in the Last Year: Not on file  Transportation Needs:   . Lack of Transportation (Medical): Not on file  . Lack of Transportation (Non-Medical): Not on file  Physical Activity:   . Days of Exercise per Week: Not on file  . Minutes of Exercise per Session: Not on file  Stress:   . Feeling of Stress : Not on file  Social Connections:   . Frequency of Communication with Friends and Family: Not on file  . Frequency of Social Gatherings with Friends and Family: Not on file  . Attends Religious Services: Not on file  . Active Member of Clubs or Organizations: Not on file  . Attends Archivist Meetings: Not on file  . Marital Status: Not on file    Allergies: Allergies  Allergen Reactions  . Ace Inhibitors Anaphylaxis and Swelling  . Buspirone Other (See Comments)    dizziness    Outpatient Meds: Current Outpatient Medications  Medication Sig Dispense Refill  . atorvastatin (LIPITOR) 40 MG tablet Take 1 tablet (40 mg total) by mouth daily. 90 tablet 1  . b complex vitamins tablet Take 1 tablet by mouth daily at 12 noon.    . blood glucose meter kit and supplies KIT Use to check blood sugar. DX E11.9 1 each 0  . Cholecalciferol (VITAMIN D3) 1.25 MG (50000 UT) CAPS TAKE 1 CAPSULE BY MOUTH ONE TIME PER WEEK 12 capsule 0  . clonazePAM (KLONOPIN) 1 MG tablet TAKE 1  TABLET (1 MG TOTAL) BY MOUTH 2 (TWO) TIMES DAILY AS NEEDED FOR ANXIETY. 60 tablet 3  . dapagliflozin propanediol (FARXIGA) 10 MG TABS tablet Take 10 mg by mouth daily. 90 tablet 1  . Icosapent Ethyl (VASCEPA) 1 g CAPS Take 2 capsules (2 g total) by mouth 2 (two) times daily. (Patient taking differently: Take 2 capsules by mouth daily. ) 360 capsule 1  . Insulin Degludec (TRESIBA FLEXTOUCH) 200 UNIT/ML SOPN Inject 26 Units into the skin daily. (Patient taking differently: Inject 25 Units into the skin as needed. ) 9  mL 1  . Insulin Pen Needle 32G X 8 MM MISC Use as directed 100 each 0  . losartan (COZAAR) 50 MG tablet TAKE 1 TABLET BY MOUTH EVERY DAY 90 tablet 3  . metoprolol succinate (TOPROL-XL) 100 MG 24 hr tablet TAKE 1 TABLET (100 MG TOTAL) BY MOUTH DAILY. TAKE WITH OR IMMEDIATELY FOLLOWING A MEAL. 90 tablet 3  . pantoprazole (PROTONIX) 40 MG tablet TAKE 1 TABLET (40 MG TOTAL) BY MOUTH DAILY. 90 tablet 1  . potassium chloride SA (KLOR-CON M20) 20 MEQ tablet Take 2 tablets (40 mEq total) by mouth daily. (Patient taking differently: Take 40 mEq by mouth daily as needed. ) 60 tablet 3  . PRESCRIPTION MEDICATION Inhale into the lungs See admin instructions. Bipap, pressure 16/12 with 2L of O2 - use whenever sleeping    . sildenafil (REVATIO) 20 MG tablet Take 4 tablets (80 mg total) by mouth daily as needed. 60 tablet 5  . spironolactone (ALDACTONE) 25 MG tablet TAKE 1 TABLET BY MOUTH EVERY DAY 90 tablet 3  . torsemide (DEMADEX) 20 MG tablet Take 20 mg by mouth daily.    Alveda Reasons 20 MG TABS tablet TAKE 1 TABLET BY MOUTH EVERY DAY 30 tablet 5   No current facility-administered medications for this visit.      ___________________________________________________________________ Objective   Exam:  BP 104/68 (BP Location: Right Arm, Patient Position: Sitting, Cuff Size: Large)   Pulse 72   Ht _0  (1.803 m)   Wt (!) 402 lb (182.3 kg)   BMI 56.07 kg/m    General: Morbidly obese man,  ambulates, gets on exam table by himself.  Alert, conversational, pleasant  Eyes: sclera anicteric, no redness.  Right-sided lateral strabismus  ENT: oral mucosa moist without lesions, no cervical or supraclavicular lymphadenopathy  CV: RRR without murmur, heart sounds distant, no JVD, + peripheral edema  Resp: clear to auscultation bilaterally, normal RR and effort noted  GI: soft, no tenderness, with active bowel sounds.  Cannot assess hepatosplenomegaly or mass due to abdominal girth and large pannus  Skin; warm and dry, no rash or jaundice noted  Neuro: awake, alert and oriented x 3. Normal gross motor function and fluent speech  Labs:  CBC Latest Ref Rng & Units 10/11/2019 04/12/2019 01/19/2019  WBC 4.0 - 10.5 K/uL 9.3 8.1 11.9(H)  Hemoglobin 13.0 - 17.0 g/dL 14.8 14.8 15.2  Hematocrit 39.0 - 52.0 % 42.7 41.6 43.9  Platelets 150 - 400 K/uL 251 221.0 266.0   CMP Latest Ref Rng & Units 10/25/2019 10/20/2019 10/11/2019  Glucose 70 - 99 mg/dL 106(H) 152(H) 132(H)  BUN 6 - 20 mg/dL _1 Creatinine 0.61 - 1.24 mg/dL 1.32(H) 1.21 1.10  Sodium 135 - 145 mmol/L 138 141 142  Potassium 3.5 - 5.1 mmol/L 3.7 3.3(L) 3.4(L)  Chloride 98 - 111 mmol/L 102 105 110  CO2 22 - 32 mmol/L _2 Calcium 8.9 - 10.3 mg/dL 9.6 9.3 9.0  Total Protein 6.5 - 8.1 g/dL - - 6.8  Total Bilirubin 0.3 - 1.2 mg/dL - - 0.7  Alkaline Phos 38 - 126 U/L - - 73  AST 15 - 41 U/L - - 16  ALT 0 - 44 U/L - - 24     Radiologic Studies:  Recent cardiac echo reviewed  Assessment: Encounter Diagnoses  Name Primary?  . Special screening for malignant neoplasms, colon Yes  . Nonischemic cardiomyopathy ([place])   . Morbid obesity ([place])   Obstructive sleep  apnea  Mr. Brem and I discussed colon cancer screening at length, nature of available testing along with risks and benefits.  While colon cancer screening is advised in appropriate patients in order to decrease the risk of colorectal cancer, it still may  not be advisable in some patients. It is not clear to me that he has a 10-year life expectancy, which is a prerequisite for any colon cancer screening.  This patient's multiple severe chronic medical conditions put him at very high risk for complications related to endoscopic procedures.  As such, I think the risks of screening colonoscopy outweigh the expected benefits.   As such, I would not pursue alternate testing such as Cologuard at this point since it could be positive from microscopic occult GI blood while he is on anticoagulation, and if positive, he would still be at very high risk for this endoscopic procedure.  Should he develop symptoms of concern, or his health status improves enough that this risk-benefit ratio changes, then he can be referred back to [place].  Total 30-minute visit, over half spent in discussion with patient.  Thank you for the courtesy of this consult.  Please call me with any questions or concerns.  Nelida Meuse III  CC: Referring provider noted above

## 2019-11-15 NOTE — Addendum Note (Signed)
Addended by: Freada Bergeron on: 11/15/2019 03:20 PM   Modules accepted: Orders

## 2019-11-16 ENCOUNTER — Telehealth: Payer: Self-pay | Admitting: *Deleted

## 2019-11-16 NOTE — Telephone Encounter (Signed)
PA submitted to [facility] for split night titration.

## 2019-11-17 NOTE — Addendum Note (Signed)
Addended by: Freada Bergeron on: 11/17/2019 01:41 PM   Modules accepted: Orders

## 2019-11-19 ENCOUNTER — Other Ambulatory Visit: Payer: Self-pay | Admitting: Internal Medicine

## 2019-11-19 DIAGNOSIS — L732 Hidradenitis suppurativa: Secondary | ICD-10-CM

## 2019-11-20 ENCOUNTER — Other Ambulatory Visit: Payer: Self-pay | Admitting: Internal Medicine

## 2019-11-20 DIAGNOSIS — I5042 Chronic combined systolic (congestive) and diastolic (congestive) heart failure: Secondary | ICD-10-CM

## 2019-11-20 DIAGNOSIS — E118 Type 2 diabetes mellitus with unspecified complications: Secondary | ICD-10-CM

## 2019-11-23 ENCOUNTER — Telehealth: Payer: Self-pay | Admitting: *Deleted

## 2019-11-23 NOTE — Telephone Encounter (Signed)
Staff message sent to [facility] ok to schedule sleep study. Holli Humbles received. Auth # [id number]. Valid dates 11/19/19 to 05/17/20.

## 2019-11-25 DIAGNOSIS — G4733 Obstructive sleep apnea (adult) (pediatric): Secondary | ICD-10-CM | POA: Diagnosis not present

## 2019-12-01 ENCOUNTER — Telehealth: Payer: Self-pay | Admitting: *Deleted

## 2019-12-01 NOTE — Telephone Encounter (Signed)
Patient is scheduled for BiPAP Titration on 12/19/19. Pt is scheduled for COVID screening on 12/16/19 2:30 prior to titration.   Patient understands his titration study will be done at [facility] sleep lab. Patient understands he will receive a letter in a week or so detailing appointment, date, time, and location. Patient understands to call if he does not receive the letter  in a timely manner. Patient agrees with treatment and thanked me for call.

## 2019-12-01 NOTE — Telephone Encounter (Signed)
-----   Message from Lauralee Evener, CMA sent at 11/23/2019 10:48 AM EST ----- Received [facility] Auth.for split night. OK TO SCHEDULE. AUTH # [id number]. Valid dates 11/19/19 to 05/17/20.

## 2019-12-02 DIAGNOSIS — I509 Heart failure, unspecified: Secondary | ICD-10-CM | POA: Diagnosis not present

## 2019-12-02 DIAGNOSIS — I10 Essential (primary) hypertension: Secondary | ICD-10-CM | POA: Diagnosis not present

## 2019-12-02 DIAGNOSIS — G4733 Obstructive sleep apnea (adult) (pediatric): Secondary | ICD-10-CM | POA: Diagnosis not present

## 2019-12-06 ENCOUNTER — Other Ambulatory Visit (HOSPITAL_COMMUNITY): Payer: Self-pay | Admitting: Cardiology

## 2019-12-06 DIAGNOSIS — I5042 Chronic combined systolic (congestive) and diastolic (congestive) heart failure: Secondary | ICD-10-CM

## 2019-12-12 IMAGING — CT CT FOOT*R* W/O CM
3 series · 12 of 35 positions shown, 14 images · non-contrast
Comparison: Right great toe radiographs performed 02/12/2016

CLINICAL DATA: Acute onset of right foot pain. Assess for
osteomyelitis or necrotizing fasciitis.

EXAM:
CT OF THE RIGHT FOOT WITHOUT CONTRAST
TECHNIQUE: Multidetector CT imaging of the right foot was performed according
to the standard protocol. Multiplanar CT image reconstructions were
also generated.

[Series 4: lower ext 1.5 st · axial · 0.64mm/px · z∈[+26,+161]mm · 4 of 132 slices shown, 5 images]
[im 21/132  soft-tissue]
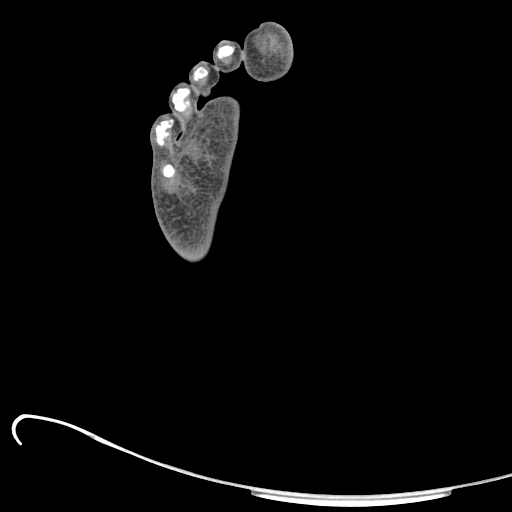
[im 21/132  bone]
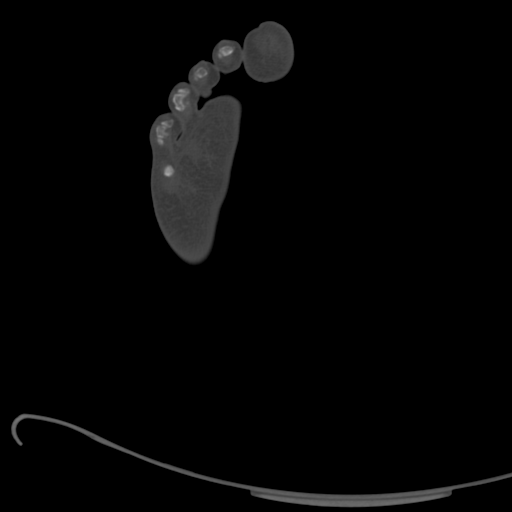
[im 51/132  bone]
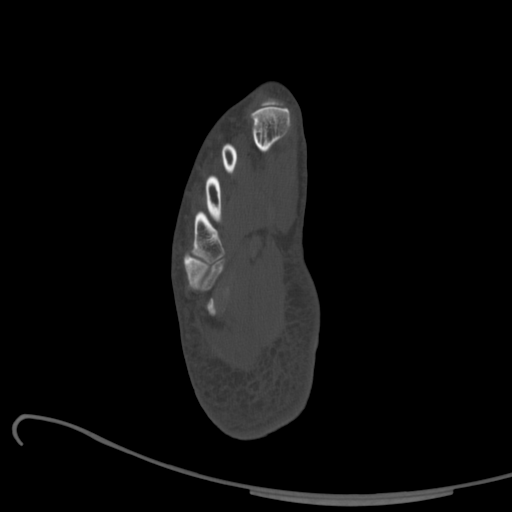
[im 81/132  bone]
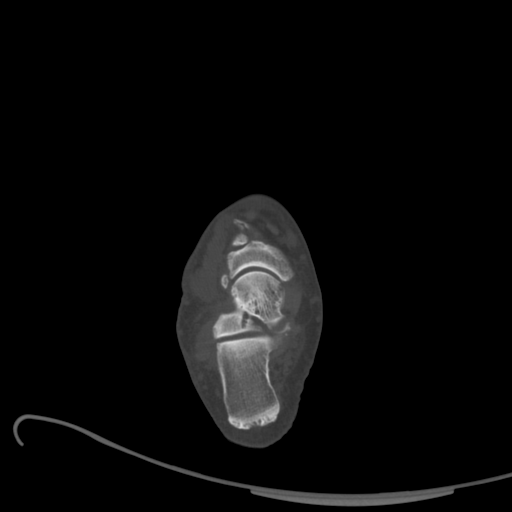
[im 111/132  bone]
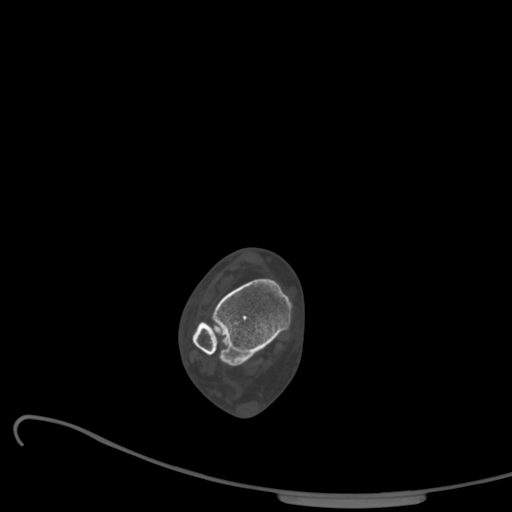

[Series 9: lower ext cor st · coronal · 0.30mm/px · 3 of 191 slices shown]
[im 51/191  bone]
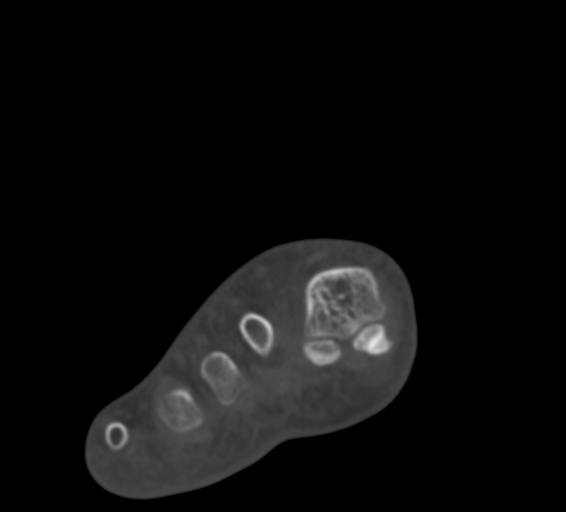
[im 81/191  bone]
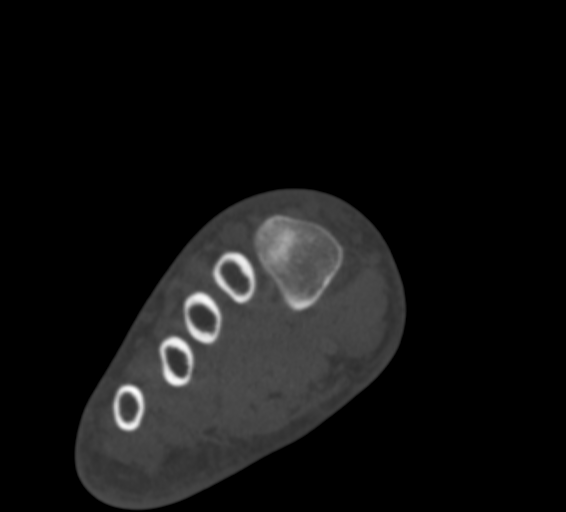
[im 111/191  bone]
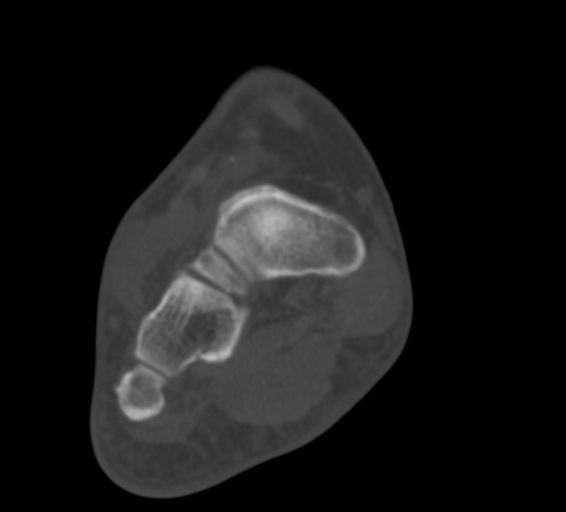

[Series 10: lower ext sag st · sagittal · 0.33mm/px · 5 of 84 slices shown, 6 images]
[im 28/84  bone]
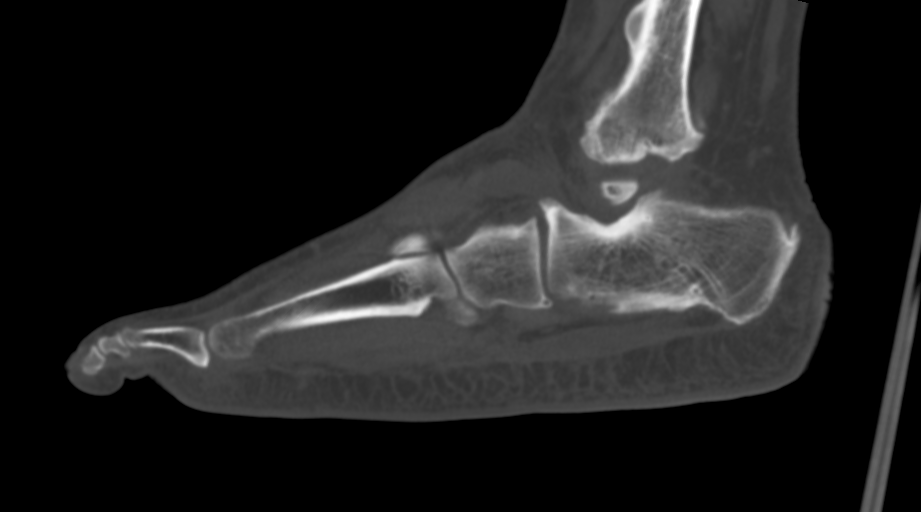
[im 35/84  bone]
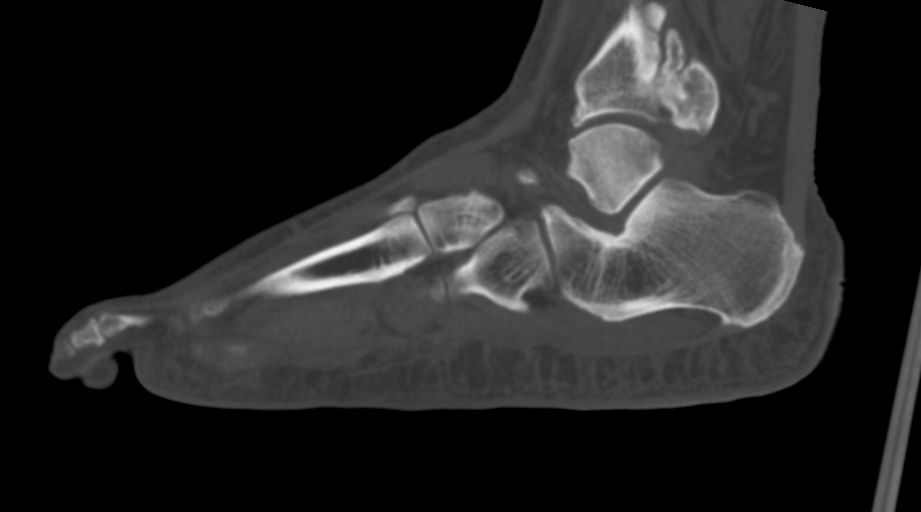
[im 42/84  soft-tissue]
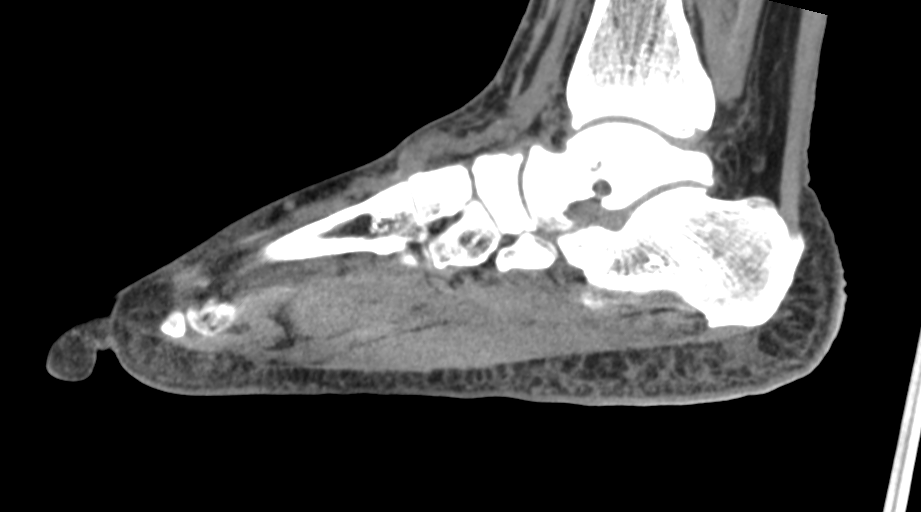
[im 42/84  bone]
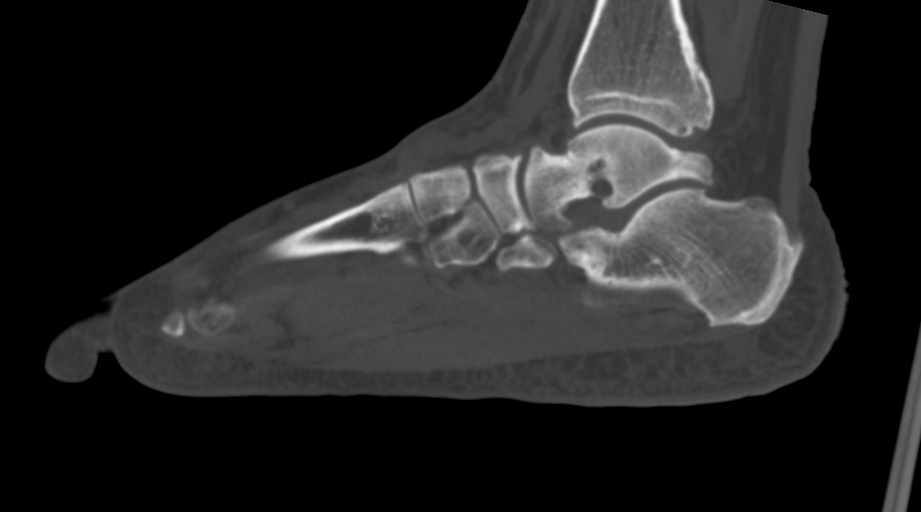
[im 49/84  bone]
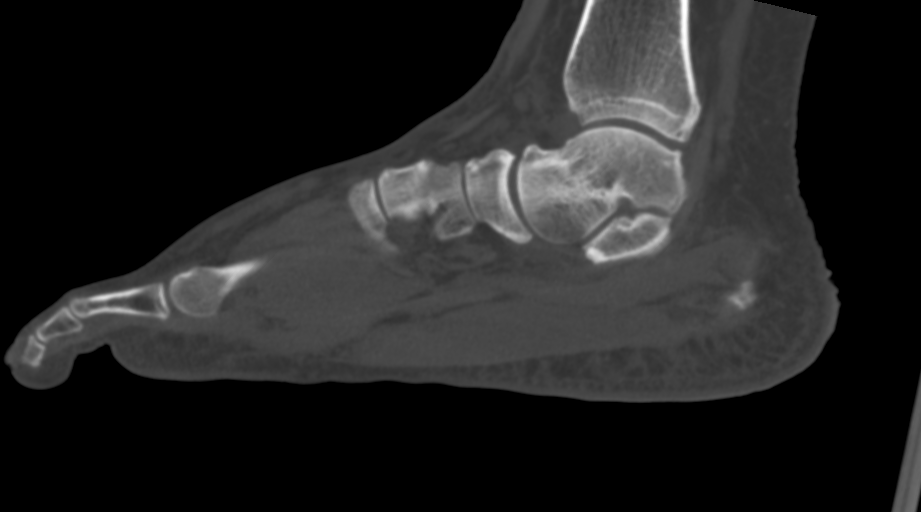
[im 56/84  bone]
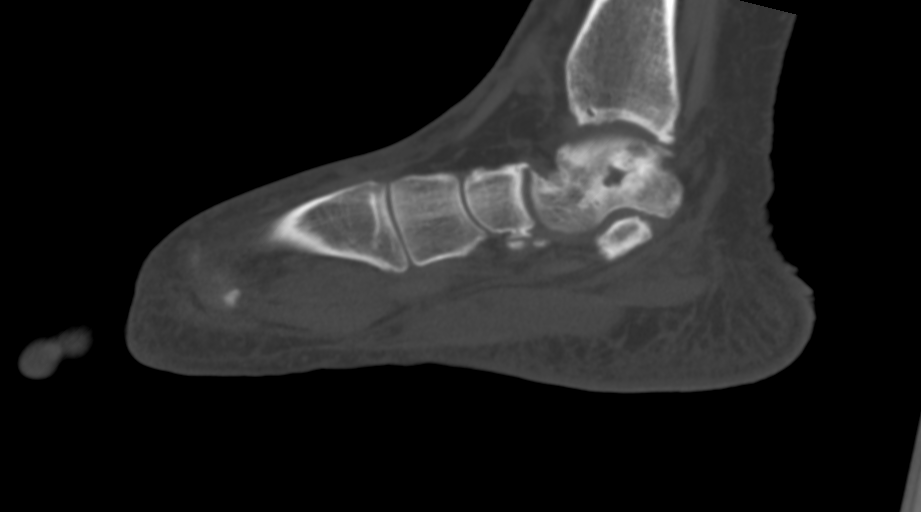

[12 of 35 positions shown; findings below may reference images not displayed]

FINDINGS: Bones/Joint/Cartilage

There is no evidence of fracture or dislocation. No definite osseous
erosions are seen to suggest osteomyelitis, though evaluation for
osteomyelitis is somewhat suboptimal on CT. Visualized joint spaces
are grossly preserved.

Chronic cystic change is noted at the mid talus. The subtalar joint
is unremarkable in appearance. There is mild chronic deformity about
the distal tibia and fibula, reflecting remote injury.

The cartilage is not well assessed on CT. A small ankle joint
effusion is noted. An os naviculare is noted. An os peroneum is
seen.

Ligaments

Suboptimally assessed by CT.

Muscles and Tendons

The visualized flexor and extensor tendons are grossly unremarkable.
The Achilles tendon is unremarkable in appearance. The peroneal
tendons appear grossly intact. The nodularity of musculature along
the dorsum of the midfoot may reflect underlying degenerative
change.

Soft tissues

The soft tissues are otherwise unremarkable in appearance.
IMPRESSION: 1. No evidence of fracture or dislocation. No definite osseous
erosions seen to suggest osteomyelitis, though evaluation for
osteomyelitis is somewhat suboptimal on CT.
2. Small ankle joint effusion noted.
3. Mild chronic deformity about the distal tibia and fibula,
reflecting remote injury. Chronic cystic change at the mid talus.

## 2019-12-16 ENCOUNTER — Other Ambulatory Visit (HOSPITAL_COMMUNITY)
Admission: RE | Admit: 2019-12-16 | Discharge: 2019-12-16 | Disposition: A | Discharge status: Home / Self Care | Payer: Medicare HMO | Source: Ambulatory Visit | Attending: Cardiology | Admitting: Cardiology

## 2019-12-16 DIAGNOSIS — Z01812 Encounter for preprocedural laboratory examination: Secondary | ICD-10-CM | POA: Insufficient documentation

## 2019-12-16 DIAGNOSIS — Z20822 Contact with and (suspected) exposure to covid-19: Secondary | ICD-10-CM | POA: Diagnosis not present

## 2019-12-16 LAB — SARS CORONAVIRUS 2 (TAT 6-24 HRS): SARS Coronavirus 2: NEGATIVE

## 2019-12-19 ENCOUNTER — Other Ambulatory Visit: Payer: Self-pay

## 2019-12-19 ENCOUNTER — Ambulatory Visit (HOSPITAL_BASED_OUTPATIENT_CLINIC_OR_DEPARTMENT_OTHER): Payer: Medicare HMO | Attending: Cardiology | Admitting: Cardiology

## 2019-12-19 VITALS — Ht 69.0 in | Wt 385.0 lb

## 2019-12-19 DIAGNOSIS — G4733 Obstructive sleep apnea (adult) (pediatric): Secondary | ICD-10-CM | POA: Insufficient documentation

## 2019-12-20 ENCOUNTER — Other Ambulatory Visit (HOSPITAL_BASED_OUTPATIENT_CLINIC_OR_DEPARTMENT_OTHER): Payer: Self-pay

## 2019-12-20 ENCOUNTER — Telehealth: Payer: Self-pay | Admitting: *Deleted

## 2019-12-20 DIAGNOSIS — G4733 Obstructive sleep apnea (adult) (pediatric): Secondary | ICD-10-CM

## 2019-12-20 NOTE — Telephone Encounter (Signed)
Per Melissa ADAPT ordered the ONO without 02 to try to qualify the patient for 02 on Bipap.

## 2019-12-20 NOTE — Procedures (Signed)
   Patient Name: [name], Grief Date: 12/19/2019 Gender: Male D.O.B: 01/11/1966 Age (years): 16 Referring Provider: Fransico Him MD, ABSM Height (inches): 69 Interpreting Physician: Fransico Him MD, ABSM Weight (lbs): 385 RPSGT: Gwenyth Allegra BMI: 57 MRN: [id number] Neck Size: 18.00  CLINICAL INFORMATION The patient is referred for a BiPAP titration to treat sleep apnea.  SLEEP STUDY TECHNIQUE As per the AASM Manual for the Scoring of Sleep and Associated Events v2.3 (April 2016) with a hypopnea requiring 4% desaturations.  The channels recorded and monitored were frontal, central and occipital EEG, electrooculogram (EOG), submentalis EMG (chin), nasal and oral airflow, thoracic and abdominal wall motion, anterior tibialis EMG, snore microphone, electrocardiogram, and pulse oximetry. Bilevel positive airway pressure (BPAP) was initiated at the beginning of the study and titrated to treat sleep-disordered breathing.  MEDICATIONS Medications self-administered by patient taken the night of the study : N/A  RESPIRATORY PARAMETERS Optimal IPAP Pressure (cm): 18  AHI at Optimal Pressure (/hr) 0.0 Optimal EPAP Pressure (cm):14  Overall Minimal O2 (%):91.0  Minimal O2 at Optimal Pressure (%): 94.0  SLEEP ARCHITECTURE Start Time:10:19:15 PM  top Time:4:20:24 AM  Total Time (min):361.1  Total Sleep Time (min):272 Sleep Latency (min): 22.9  Sleep Efficiency (%): 75.3%  EM Latency (min):3.0  WASO (min): 66.2 Stage N1 (%): 5.0%  Stage N2 (%): 65.1%  Stage N3 (%): 0.0% Stage R (%):30 Supine (%):35.66  Arousal Index (/hr):1.8   CARDIAC DATA The 2 lead EKG demonstrated sinus rhythm. The mean heart rate was 53.1 beats per minute. Other EKG findings include: PVCs and PACs  LEG MOVEMENT DATA The total Periodic Limb Movements of Sleep (PLMS) were 0. The PLMS index was 0.0. A PLMS index of <15 is considered normal in adults.  IMPRESSIONS - An optimal PAP pressure was selected  for this patient ( 18 / cm of water) - Central sleep apnea was not noted during this titration (CAI = 1.3/h). - Significant oxygen desaturations were not observed during this titration (min O2 = 91.0%). - The patient snored with soft snoring volume. - 2-lead EKG demonstrated: PVCs and PACs - Clinically significant periodic limb movements were not noted during this study. Arousals associated with PLMs were rare.  DIAGNOSIS - Obstructive Sleep Apnea (327.23 [G47.33 ICD-10])  RECOMMENDATIONS - Trial of BiPAP therapy on Auto BiPAP as patient fell optimum pressure was too high.  Recommend IPAP max 20cm H2O, EPAP min 5cm H2O and PS 4cm H2O with a Large size Fisher&Paykel Full Face Mask Simplus mask and heated humidification. - Avoid alcohol, sedatives and other CNS depressants that may worsen sleep apnea and disrupt normal sleep architecture. - Sleep hygiene should be reviewed to assess factors that may improve sleep quality. - Weight management and regular exercise should be initiated or continued. - Return to Sleep Center for re-evaluation after 10 weeks of therapy  [Electronically signed] 12/20/2019 12:42 PM  Fransico Him MD, ABSM Diplomate, American Board of Sleep Medicine

## 2019-12-20 NOTE — Telephone Encounter (Signed)
Informed patient of sleep study results and patient understanding was verbalized. Patient understands his sleep study showed they had a successful PAP titration and let DME know that orders are in EPIC. Please set up 10 week OV with me.   Upon patient request DME selection is [facility] Patient understands he will be contacted by [facility] to set up his cpap.  Patient has expressed that he does not want to get a new unit at this time but wants to keep his old Bipap with the current settings. He states his old unit still works. I was not able to find him in [place] and neither did [facility] because he is under the old [place] and they are not able to access those records anymore. The new Bipap order has not been sent to [facility] per patient request.

## 2019-12-20 NOTE — Telephone Encounter (Signed)
Please let patient know that we do not know old settings and therefore I cannot set it at the old settings.  To get the benefit of the PAP we need to place him on the settings determined on his PAP titration - this was the reason for the titration.  I have no way of following him to see if he is adequately treated if we cannot get download data.

## 2019-12-20 NOTE — Telephone Encounter (Signed)
-----   Message from [name], MD sent at 12/20/2019 12:48 PM EST ----- Please let patient know that they had a successful PAP titration and let DME know that orders are in EPIC.  Please set up 10 week OV with me.

## 2019-12-21 ENCOUNTER — Encounter: Payer: Self-pay | Admitting: Internal Medicine

## 2019-12-21 ENCOUNTER — Telehealth: Payer: Self-pay | Admitting: Internal Medicine

## 2019-12-21 ENCOUNTER — Ambulatory Visit (INDEPENDENT_AMBULATORY_CARE_PROVIDER_SITE_OTHER): Payer: Medicare HMO | Admitting: Internal Medicine

## 2019-12-21 ENCOUNTER — Other Ambulatory Visit: Payer: Self-pay

## 2019-12-21 VITALS — BP 124/78 | HR 71 | Temp 98.7°F | Resp 16 | Ht 69.0 in | Wt 383.2 lb

## 2019-12-21 DIAGNOSIS — F319 Bipolar disorder, unspecified: Secondary | ICD-10-CM | POA: Diagnosis not present

## 2019-12-21 DIAGNOSIS — R69 Illness, unspecified: Secondary | ICD-10-CM | POA: Diagnosis not present

## 2019-12-21 MED ORDER — CARIPRAZINE HCL 1.5 MG PO CAPS: 1.5000 mg | ORAL_CAPSULE | Freq: Every day | ORAL | 0 refills | Status: AC

## 2019-12-21 MED ORDER — CARIPRAZINE HCL 3 MG PO CAPS: 3.0000 mg | ORAL_CAPSULE | Freq: Every day | ORAL | 0 refills | Status: AC

## 2019-12-21 NOTE — Progress Notes (Signed)
Subjective:  Patient ID: [name], male    DOB: [birth year]  Age: 54 y.o. MRN: [id number]  CC: Manic Behavior  This visit occurred during the SARS-CoV-2 public health emergency.  Safety protocols were in place, including screening questions prior to the visit, additional usage of staff PPE, and extensive cleaning of exam room while observing appropriate contact time as indicated for disinfecting solutions.    HPI [name] presents for f/up - He has a history of bipolar disorder and has not recently been compliant with any medications for this.  He tells me that over the last 2 weeks he has had racing thoughts, irritability, anxiety, and depressive thoughts.  He denies feeling hopeless, helpless, suicidal, or homicidal.  He has previously tried Zoloft, Wellbutrin, BuSpar, and Dietitian.  He tells me that [name] was the most beneficial for him.  He continues to smoke cigarettes but tells me he is abstaining from alcohol intake.  Outpatient Medications Prior to Visit  Medication Sig Dispense Refill  . atorvastatin (LIPITOR) 40 MG tablet Take 1 tablet (40 mg total) by mouth daily. 90 tablet 1  . b complex vitamins tablet Take 1 tablet by mouth daily at 12 noon.    . blood glucose meter kit and supplies KIT Use to check blood sugar. DX E11.9 1 each 0  . Cholecalciferol (VITAMIN D3) 1.25 MG (50000 UT) CAPS TAKE 1 CAPSULE BY MOUTH ONE TIME PER WEEK 12 capsule 0  . clonazePAM (KLONOPIN) 1 MG tablet TAKE 1 TABLET (1 MG TOTAL) BY MOUTH 2 (TWO) TIMES DAILY AS NEEDED FOR ANXIETY. 60 tablet 3  . FARXIGA 10 MG TABS tablet TAKE 1 TABLET BY MOUTH EVERY DAY 90 tablet 1  . Icosapent Ethyl (VASCEPA) 1 g CAPS Take 2 capsules (2 g total) by mouth 2 (two) times daily. (Patient taking differently: Take 2 capsules by mouth daily. ) 360 capsule 1  . losartan (COZAAR) 50 MG tablet TAKE 1 TABLET BY MOUTH EVERY DAY 90 tablet 3  . metoprolol succinate (TOPROL-XL) 100 MG 24 hr tablet TAKE 1 TABLET (100 MG TOTAL)  BY MOUTH DAILY. TAKE WITH OR IMMEDIATELY FOLLOWING A MEAL. 90 tablet 3  . pantoprazole (PROTONIX) 40 MG tablet TAKE 1 TABLET (40 MG TOTAL) BY MOUTH DAILY. 90 tablet 1  . potassium chloride SA (KLOR-CON M20) 20 MEQ tablet Take 2 tablets (40 mEq total) by mouth daily. (Patient taking differently: Take 40 mEq by mouth daily as needed. ) 60 tablet 3  . sildenafil (REVATIO) 20 MG tablet Take 4 tablets (80 mg total) by mouth daily as needed. 60 tablet 5  . spironolactone (ALDACTONE) 25 MG tablet TAKE 1 TABLET BY MOUTH EVERY DAY 90 tablet 3  . torsemide (DEMADEX) 20 MG tablet TAKE 2 TABLETS BY MOUTH EVERY DAY 180 tablet 1  . XARELTO 20 MG TABS tablet TAKE 1 TABLET BY MOUTH EVERY DAY 30 tablet 5  . Insulin Degludec (TRESIBA FLEXTOUCH) 200 UNIT/ML SOPN Inject 26 Units into the skin daily. (Patient not taking: Reported on 12/21/2019) 9 mL 1  . Insulin Pen Needle 32G X 8 MM MISC Use as directed (Patient not taking: Reported on 12/21/2019) 100 each 0  . PRESCRIPTION MEDICATION Inhale into the lungs See admin instructions. Bipap, pressure 16/12 with 2L of O2 - use whenever sleeping     No facility-administered medications prior to visit.    ROS Review of Systems  Constitutional: Negative for diaphoresis, fatigue and unexpected weight change.  HENT: Negative.  Eyes: Negative for visual disturbance.  Respiratory: Negative for cough, chest tightness, shortness of breath and wheezing.   Cardiovascular: Negative for chest pain, palpitations and leg swelling.  Gastrointestinal: Negative for abdominal pain, constipation, diarrhea, nausea and vomiting.  Endocrine: Negative.   Genitourinary: Negative.   Musculoskeletal: Negative for arthralgias and myalgias.  Skin: Negative.   Neurological: Negative.  Negative for dizziness, weakness and light-headedness.  Hematological: Negative.   Psychiatric/Behavioral: Positive for agitation, dysphoric mood and sleep disturbance. Negative for behavioral problems, confusion,  decreased concentration, hallucinations, self-injury and suicidal ideas. The patient is nervous/anxious. The patient is not hyperactive.     Objective:  BP 124/78 (BP Location: Left Arm, Patient Position: Sitting, Cuff Size: Large)   Pulse 71   Temp 98.7 F (37.1 C) (Oral)   Resp 16   Ht 5' 9"  (1.753 m)   Wt (!) 383 lb 4 oz (173.8 kg)   SpO2 98%   BMI 56.60 kg/m   BP Readings from Last 3 Encounters:  12/21/19 124/78  11/15/19 104/68  11/04/19 127/77    Wt Readings from Last 3 Encounters:  12/21/19 (!) 383 lb 4 oz (173.8 kg)  12/19/19 (!) 385 lb (174.6 kg)  11/15/19 (!) 402 lb (182.3 kg)    Physical Exam Vitals reviewed.  Constitutional:      Appearance: He is obese. He is not diaphoretic.  HENT:     Nose: Nose normal.     Mouth/Throat:     Mouth: Mucous membranes are moist.  Eyes:     General: No scleral icterus.    Conjunctiva/sclera: Conjunctivae normal.  Cardiovascular:     Rate and Rhythm: Normal rate and regular rhythm.     Heart sounds: No murmur.  Pulmonary:     Effort: Pulmonary effort is normal.     Breath sounds: No stridor. No wheezing, rhonchi or rales.  Abdominal:     General: Abdomen is protuberant. Bowel sounds are normal. There is no distension.     Palpations: Abdomen is soft. There is no hepatomegaly or splenomegaly.     Tenderness: There is no abdominal tenderness.  Musculoskeletal:        General: Normal range of motion.     Cervical back: Neck supple.     Right lower leg: No edema.     Left lower leg: No edema.  Lymphadenopathy:     Cervical: No cervical adenopathy.  Skin:    General: Skin is warm and dry.  Neurological:     General: No focal deficit present.     Mental Status: He is alert.  Psychiatric:        Attention and Perception: Attention and perception normal.        Mood and Affect: Mood is depressed. Mood is not anxious or elated. Affect is flat and angry. Affect is not labile, blunt, tearful or inappropriate.         Speech: Speech normal. Speech is not rapid and pressured, delayed, slurred or tangential.        Behavior: Behavior is not agitated, slowed, aggressive, withdrawn or hyperactive. Behavior is cooperative.        Thought Content: Thought content normal. Thought content is not paranoid or delusional. Thought content does not include homicidal or suicidal ideation. Thought content does not include homicidal or suicidal plan.        Cognition and Memory: Cognition normal.        Judgment: Judgment normal.     Lab Results  Component Value  Date   WBC 9.3 10/11/2019   HGB 14.8 10/11/2019   HCT 42.7 10/11/2019   PLT 251 10/11/2019   GLUCOSE 106 (H) 10/25/2019   CHOL 183 10/12/2019   TRIG 259.0 (H) 10/12/2019   HDL 31.80 (L) 10/12/2019   LDLDIRECT 106.0 10/12/2019   LDLCALC 128 (H) 12/07/2018   ALT 24 10/11/2019   AST 16 10/11/2019   NA 138 10/25/2019   K 3.7 10/25/2019   CL 102 10/25/2019   CREATININE 1.32 (H) 10/25/2019   BUN 16 10/25/2019   CO2 25 10/25/2019   TSH 1.54 01/19/2019   PSA 0.63 10/12/2019   INR 1.1 (H) 04/12/2019   HGBA1C 6.0 10/12/2019   MICROALBUR 0.9 01/19/2019    No results found.  Assessment & Plan:   Westly was seen today for manic behavior.  Diagnoses and all orders for this visit:  Bipolar 1 disorder, depressed ([place])- Will start cariprazine at 1.5 mg a day.  Will slowly increase the dose over the next few weeks. -     cariprazine (VRAYLAR) capsule; Take 1 capsule (1.5 mg total) by mouth daily for 7 days. -     cariprazine (VRAYLAR) capsule; Take 1 capsule (3 mg total) by mouth daily.   I am having Loomis L. Milroy start on cariprazine and cariprazine. I am also having him maintain his blood glucose meter kit and supplies, PRESCRIPTION MEDICATION, b complex vitamins, Insulin Pen Needle, sildenafil, metoprolol succinate, pantoprazole, spironolactone, Vitamin D3, clonazePAM, Xarelto, Tresiba FlexTouch, potassium chloride SA, Vascepa, atorvastatin, losartan,  Farxiga, and torsemide.  Meds ordered this encounter  Medications  . cariprazine (VRAYLAR) capsule    Sig: Take 1 capsule (1.5 mg total) by mouth daily for 7 days.    Dispense:  7 capsule    Refill:  0  . cariprazine (VRAYLAR) capsule    Sig: Take 1 capsule (3 mg total) by mouth daily.    Dispense:  56 capsule    Refill:  0     Follow-up: Return in about 2 months (around 02/18/2020).  Scarlette Calico, MD

## 2019-12-21 NOTE — Patient Instructions (Signed)
Bipolar 1 Disorder Bipolar 1 disorder is a mental health disorder in which a person has episodes of emotional highs, or mania, and may also have episodes of lows, or depression. Bipolar 1 disorder is different from other bipolar disorders in that it involves extreme episodes of mania (manic episodes). These episodes last at least one week or involve symptoms that are so severe that hospitalization is needed to keep the person safe. What are the causes? The cause of this condition is not known. What increases the risk? The following factors may make you more likely to develop this condition:  Having a family member with the disorder.  Having an imbalance of certain chemicals in the brain (neurotransmitters).  Experiencing stress, such as illness, financial problems, or a death.  Having certain conditions that affect the brain or spinal cord (neurologic conditions).  Having had a brain injury (trauma). What are the signs or symptoms? Symptoms of mania include:  Very high self-esteem or self-confidence.  Decreased need for sleep.  Unusual talkativeness. Speech may be very fast.  Racing thoughts with quick shifts between topics that may or may not be related (flight of ideas).  Ability to concentrate either greatly improved or decreased.  Increased purposeful activity, such as work, studies, or social activity.  Increased agitation. This could be pacing, squirming, fidgeting, or finger and toe tapping.  Impulsive behavior and poor judgment. This may result in high-risk activities that are sexual, financial, or physical. Symptoms of depression include:  Extreme degrees of sadness, uncontrollable crying, hopelessness, worthlessness, or numbness.  Sleep problems, such as insomnia, waking early, or sleeping too much.  No longer enjoying things you used to enjoy.  Isolation. You may often spend time alone.  Lack of energy or motivation, and moving more slowly than  normal.  Trouble making decisions.  Increased appetite or loss of appetite.  Thoughts of death, or wanting to harm yourself. Sometimes, you may have a mixed mood. This means having symptoms of mania and depression at the same time. Stress can often trigger these symptoms. How is this diagnosed? This condition may be diagnosed based on:  Emotional episodes.  Medical history.  Use of alcohol, drugs, and prescription medicines. Certain medical conditions and substances can cause symptoms that seem like bipolar disorder. This is called secondary bipolar disorder. Your health care provider may ask you to take a short test. This helps to understand your symptoms. You may also be asked to see a mental health specialist to follow up on this diagnosis and start treatment. How is this treated?     This condition is a long-term (chronic) illness. It is often managed with ongoing treatment rather than treatment only when symptoms occur. A combination of treatments is the main approach. Treatment may include:  Medicine. Medicine can be prescribed by a health care provider who specializes in treating mental health disorders (psychiatrist). Medicines called mood stabilizers are usually prescribed. If symptoms occur during treatment with a mood stabilizer, other medicines may be added.  Psychotherapy. Some forms of talk therapy, such as cognitive behavioral therapy (CBT) and family therapy, can help with learning to manage bipolar disorder.  Psychoeducation. This helps you and others understand how this disorder is managed. Include friends and family in educational sessions so they learn how best to support you.  Methods of managing your condition, such as journaling or relaxation exercises. Relaxation exercises include: ? Yoga. ? Meditation. ? Deep breathing.  Lifestyle changes, such as: ? Limiting alcohol and drug use. ?   Exercising regularly. ? Structuring when you go to bed and get  up. ? Eating a healthy diet.  Electroconvulsive therapy (ECT). This is a procedure in which electricity is applied to the brain through the scalp. It may be used in cases of severe bipolar disorder when medicine and psychotherapy work too slowly or do not work. Follow these instructions at home: Activity  Return to your normal activities as told by your health care provider.  Find activities that you enjoy, and make time to do them.  Exercise regularly as told by your health care provider. Lifestyle   Follow a set schedule for eating and sleeping.  Eat a healthy diet that includes fresh fruits and vegetables, whole grains, low-fat dairy, and lean meat.  Get at least 7-8 hours of sleep each night.  Avoid using products that contain nicotine or tobacco. If you want help quitting, ask your health care provider.  Do not use drugs. Alcohol use  Do not drink alcohol if: ? Your health care provider tells you not to drink. ? You are pregnant, may be pregnant, or are planning to become pregnant.  If you drink alcohol: ? Limit how much you use to:  0-1 drink a day for women.  0-2 drinks a day for men. ? Be aware of how much alcohol is in your drink. In the [place] one drink equals one 12 oz bottle of beer (355 mL), one 5 oz glass of wine (148 mL), or one 1 oz glass of hard liquor (44 mL). General instructions  Take over-the-counter and prescription medicines only as told by your health care provider. You may think about stopping your medicine, but it is very important to take all your medicine as prescribed.  Consider joining a support group. Your health care provider may be able to recommend one.  Talk with your family and friends about your treatment goals and how they can help.  Keep all follow-up visits as told by your health care provider. This is important. Where to find more information  National Alliance on Mental Illness: www.nami.org  National Institute of Mental  Health: www.nimh.nih.gov Contact a health care provider if:  Your symptoms get worse, or your loved ones tell you that your symptoms are getting worse.  You have uncomfortable side effects from your medicine.  You have trouble sleeping.  You have trouble doing daily activities.  You feel unsafe in your surroundings.  You are self-medicating with alcohol or drugs. Get help right away if:  You have new symptoms.  You have thoughts about harming yourself or others.  You are considering suicide. If you ever feel like you may hurt yourself or others, or have thoughts about taking your own life, get help right away. You can go to your nearest emergency department or call:  Your local emergency services (911 in the [place]  A suicide crisis helpline, such as the National Suicide Prevention Lifeline at 1-800-273-8255. This is open 24 hours a day. Summary  Bipolar 1 disorder is a lifelong mental health disorder in which a person has episodes of mania and depression.  This disorder is mainly treated with a combination of medicines, talk and behavioral therapies, and, often, electroconvulsive therapy (ECT).  Include friends and family in educational sessions so they know how best to support you.  Get help right away if you are considering suicide. This information is not intended to replace advice given to you by your health care provider. Make sure you discuss any questions you   have with your health care provider. Document Revised: 05/03/2019 Document Reviewed: 05/03/2019 Elsevier Patient Education  2020 [employer].  

## 2019-12-21 NOTE — Telephone Encounter (Signed)
Copied from [place] [id number]. Topic: General - Other >> Dec 21, 2019  8:47 AM Leward Quan A wrote: Reason for CRM: Patient called to inform [name] that he is having racing thoughts again and that it is affecting his heart. He stated that several months ago Dr gave him some samples and they worked with the clonazePAM (KLONOPIN) 1 MG tablet and slowed down the thoughts in his mind. He is asking for some more of the medication that was given to him in samples but he can not remember the name and was hoping that [name] would be able to help him. Patient is willing to come in for a visit if needed. Asking for a call back at Ph#  [phone]

## 2019-12-21 NOTE — Telephone Encounter (Addendum)
Gave patient your recommendation and he is going to bring his chip in for a download this week on his current Bipap machine.  He has declined your new Bipap machine order stating he has the newer version already and it is working well. I will get the d/l to you as soon as I get it.

## 2019-12-21 NOTE — Telephone Encounter (Signed)
Pt scheduled with PCP today

## 2019-12-23 NOTE — Telephone Encounter (Signed)
Patient brought his chip to be downloaded, it will be sent to be scanned for the doctor to view.

## 2019-12-26 ENCOUNTER — Telehealth: Payer: Self-pay | Admitting: Internal Medicine

## 2019-12-26 DIAGNOSIS — G4733 Obstructive sleep apnea (adult) (pediatric): Secondary | ICD-10-CM | POA: Diagnosis not present

## 2019-12-26 NOTE — Telephone Encounter (Signed)
Yes, tylenol and cepacol lozenges as needed

## 2019-12-26 NOTE — Telephone Encounter (Signed)
    Patient calling to report left side of throat is sore x2days Patient states he has tried warm, salt water.  Patient wants to know what OTC medication he should take

## 2019-12-26 NOTE — Telephone Encounter (Signed)
Pt contacted and informed of same. Pt stated understanding.  

## 2020-01-02 DIAGNOSIS — I509 Heart failure, unspecified: Secondary | ICD-10-CM | POA: Diagnosis not present

## 2020-01-02 DIAGNOSIS — G4733 Obstructive sleep apnea (adult) (pediatric): Secondary | ICD-10-CM | POA: Diagnosis not present

## 2020-01-02 DIAGNOSIS — I10 Essential (primary) hypertension: Secondary | ICD-10-CM | POA: Diagnosis not present

## 2020-01-04 ENCOUNTER — Telehealth: Payer: Self-pay | Admitting: *Deleted

## 2020-01-04 DIAGNOSIS — G4733 Obstructive sleep apnea (adult) (pediatric): Secondary | ICD-10-CM

## 2020-01-04 NOTE — Telephone Encounter (Signed)
-----   Message from [name], MD sent at 01/04/2020  2:09 PM EST ----- Regarding: RE: order to D/C oxygen No hypoxemia on BiPAP titration - ok to discontinue O2 at night  [name] ----- Message ----- From: Freada Bergeron, CMA Sent: 01/04/2020  12:39 PM EST To: [name], MD Subject: order to D/C oxygen                            [facility] needs an order to discharge patient off of oxygen since his Bipap titration result reported he did not require oxygen while he was being tested. Patient does not want to continue being charged for the oxygen.  Thanks

## 2020-01-04 NOTE — Telephone Encounter (Signed)
Informed patient of compliance results and verbalized understanding was indicated.  Patient is aware and agreeable to AHI being within range at 2.8. Patient is aware and agreeable to being in compliance with machine usage. Patient is aware and agreeable to no change in current pressures.

## 2020-01-04 NOTE — Telephone Encounter (Signed)
-----   Message from [name], MD sent at 01/03/2020  5:09 PM EST ----- Good AHI and compliance.  Continue current PAP settings.

## 2020-01-05 ENCOUNTER — Other Ambulatory Visit: Payer: Self-pay

## 2020-01-05 ENCOUNTER — Ambulatory Visit (INDEPENDENT_AMBULATORY_CARE_PROVIDER_SITE_OTHER): Payer: Medicare HMO | Admitting: Internal Medicine

## 2020-01-05 ENCOUNTER — Other Ambulatory Visit (HOSPITAL_COMMUNITY)
Admission: RE | Admit: 2020-01-05 | Discharge: 2020-01-05 | Disposition: A | Discharge status: Home / Self Care | Payer: Medicare HMO | Source: Ambulatory Visit | Attending: Internal Medicine | Admitting: Internal Medicine

## 2020-01-05 ENCOUNTER — Encounter: Payer: Self-pay | Admitting: Internal Medicine

## 2020-01-05 VITALS — BP 124/86 | HR 79 | Temp 98.0°F | Resp 16 | Ht 69.0 in | Wt 388.0 lb

## 2020-01-05 DIAGNOSIS — E781 Pure hyperglyceridemia: Secondary | ICD-10-CM | POA: Diagnosis not present

## 2020-01-05 DIAGNOSIS — T502X5A Adverse effect of carbonic-anhydrase inhibitors, benzothiadiazides and other diuretics, initial encounter: Secondary | ICD-10-CM | POA: Diagnosis not present

## 2020-01-05 DIAGNOSIS — Z114 Encounter for screening for human immunodeficiency virus [HIV]: Secondary | ICD-10-CM | POA: Diagnosis not present

## 2020-01-05 DIAGNOSIS — E876 Hypokalemia: Secondary | ICD-10-CM | POA: Diagnosis not present

## 2020-01-05 DIAGNOSIS — F319 Bipolar disorder, unspecified: Secondary | ICD-10-CM

## 2020-01-05 DIAGNOSIS — R69 Illness, unspecified: Secondary | ICD-10-CM | POA: Diagnosis not present

## 2020-01-05 DIAGNOSIS — I10 Essential (primary) hypertension: Secondary | ICD-10-CM

## 2020-01-05 DIAGNOSIS — Z202 Contact with and (suspected) exposure to infections with a predominantly sexual mode of transmission: Secondary | ICD-10-CM | POA: Diagnosis present

## 2020-01-05 LAB — BASIC METABOLIC PANEL
BUN: 16 mg/dL (ref 6–23)
CO2: 26 mEq/L (ref 19–32)
Calcium: 9.1 mg/dL (ref 8.4–10.5)
Chloride: 103 mEq/L (ref 96–112)
Creatinine, Ser: 1.31 mg/dL (ref 0.40–1.50)
GFR: 69.72 mL/min (ref >60.00)
Glucose, Bld: 160 mg/dL — ABNORMAL HIGH (ref 70–99)
Potassium: 3.3 mEq/L — ABNORMAL LOW (ref 3.5–5.1)
Sodium: 139 mEq/L (ref 135–145)

## 2020-01-05 LAB — TRIGLYCERIDES: Triglycerides: 232 mg/dL — ABNORMAL HIGH (ref 0.0–149.0)

## 2020-01-05 MED ORDER — METRONIDAZOLE 500 MG PO TABS: 2000.0000 mg | ORAL_TABLET | Freq: Once | ORAL | 0 refills | Status: AC

## 2020-01-05 MED ORDER — POTASSIUM CHLORIDE CRYS ER 20 MEQ PO TBCR: 40.0000 meq | EXTENDED_RELEASE_TABLET | Freq: Every day | ORAL | 0 refills | Status: DC

## 2020-01-05 NOTE — Progress Notes (Signed)
Subjective:  Patient ID: [name], male    DOB: 1966-09-10  Age: 54 y.o. MRN: [id number]  CC: Exposure to STD, Hyperlipidemia, and Hypertension  This visit occurred during the SARS-CoV-2 public health emergency.  Safety protocols were in place, including screening questions prior to the visit, additional usage of staff PPE, and extensive cleaning of exam room while observing appropriate contact time as indicated for disinfecting solutions.    HPI [name] presents for f/up - He has had a new girlfriend over the last 3 to 4 weeks.  He tells me that 2 days ago she was diagnosed and treated for trichomonas.  He wants to be tested for STIs and to be treated for trichomonas.  He denies dysuria, hematuria, penile lesions, or pain or swelling of his foreskin.  He tells me his Vraylar dose is up to 3 mg a day and he is doing quite well on it.  Outpatient Medications Prior to Visit  Medication Sig Dispense Refill  . atorvastatin (LIPITOR) 40 MG tablet Take 1 tablet (40 mg total) by mouth daily. 90 tablet 1  . b complex vitamins tablet Take 1 tablet by mouth daily at 12 noon.    . blood glucose meter kit and supplies KIT Use to check blood sugar. DX E11.9 1 each 0  . cariprazine (VRAYLAR) capsule Take 1 capsule (3 mg total) by mouth daily. 56 capsule 0  . Cholecalciferol (VITAMIN D3) 1.25 MG (50000 UT) CAPS TAKE 1 CAPSULE BY MOUTH ONE TIME PER WEEK 12 capsule 0  . clonazePAM (KLONOPIN) 1 MG tablet TAKE 1 TABLET (1 MG TOTAL) BY MOUTH 2 (TWO) TIMES DAILY AS NEEDED FOR ANXIETY. 60 tablet 3  . FARXIGA 10 MG TABS tablet TAKE 1 TABLET BY MOUTH EVERY DAY 90 tablet 1  . Icosapent Ethyl (VASCEPA) 1 g CAPS Take 2 capsules (2 g total) by mouth 2 (two) times daily. (Patient taking differently: Take 2 capsules by mouth daily. ) 360 capsule 1  . Insulin Degludec (TRESIBA FLEXTOUCH) 200 UNIT/ML SOPN Inject 26 Units into the skin daily. 9 mL 1  . Insulin Pen Needle 32G X 8 MM MISC Use as directed 100 each  0  . losartan (COZAAR) 50 MG tablet TAKE 1 TABLET BY MOUTH EVERY DAY 90 tablet 3  . metoprolol succinate (TOPROL-XL) 100 MG 24 hr tablet TAKE 1 TABLET (100 MG TOTAL) BY MOUTH DAILY. TAKE WITH OR IMMEDIATELY FOLLOWING A MEAL. 90 tablet 3  . pantoprazole (PROTONIX) 40 MG tablet TAKE 1 TABLET (40 MG TOTAL) BY MOUTH DAILY. 90 tablet 1  . PRESCRIPTION MEDICATION Inhale into the lungs See admin instructions. Bipap, pressure 16/12 with 2L of O2 - use whenever sleeping    . sildenafil (REVATIO) 20 MG tablet Take 4 tablets (80 mg total) by mouth daily as needed. 60 tablet 5  . spironolactone (ALDACTONE) 25 MG tablet TAKE 1 TABLET BY MOUTH EVERY DAY 90 tablet 3  . torsemide (DEMADEX) 20 MG tablet TAKE 2 TABLETS BY MOUTH EVERY DAY 180 tablet 1  . XARELTO 20 MG TABS tablet TAKE 1 TABLET BY MOUTH EVERY DAY 30 tablet 5  . potassium chloride SA (KLOR-CON M20) 20 MEQ tablet Take 2 tablets (40 mEq total) by mouth daily. (Patient taking differently: Take 40 mEq by mouth daily as needed. ) 60 tablet 3   No facility-administered medications prior to visit.    ROS Review of Systems  Constitutional: Negative for chills, diaphoresis, fatigue and fever.  HENT: Negative.  Eyes: Negative.   Respiratory: Negative for cough, chest tightness, shortness of breath and wheezing.   Cardiovascular: Negative for chest pain, palpitations and leg swelling.  Gastrointestinal: Negative for abdominal pain, diarrhea and nausea.  Endocrine: Negative.   Genitourinary: Negative for decreased urine volume, difficulty urinating, discharge, dysuria, genital sores, hematuria, penile pain, penile swelling, scrotal swelling, testicular pain and urgency.  Musculoskeletal: Negative.  Negative for arthralgias and myalgias.  Skin: Negative.  Negative for rash.  Hematological: Negative for adenopathy. Does not bruise/bleed easily.  Psychiatric/Behavioral: Negative for confusion, decreased concentration, dysphoric mood and suicidal ideas. The  patient is not nervous/anxious.     Objective:  BP 124/86 (BP Location: Left Arm, Patient Position: Sitting, Cuff Size: Large)   Pulse 79   Temp 98 F (36.7 C) (Oral)   Resp 16   Ht 5' 9" (1.753 m)   Wt (!) 388 lb (176 kg)   SpO2 97%   BMI 57.30 kg/m   BP Readings from Last 3 Encounters:  01/05/20 124/86  12/21/19 124/78  11/15/19 104/68    Wt Readings from Last 3 Encounters:  01/05/20 (!) 388 lb (176 kg)  12/21/19 (!) 383 lb 4 oz (173.8 kg)  12/19/19 (!) 385 lb (174.6 kg)    Physical Exam Vitals reviewed.  Constitutional:      Appearance: He is obese.  HENT:     Nose: Nose normal.     Mouth/Throat:     Mouth: Mucous membranes are moist.  Eyes:     General: No scleral icterus.    Conjunctiva/sclera: Conjunctivae normal.  Cardiovascular:     Rate and Rhythm: Normal rate and regular rhythm.     Heart sounds: No murmur.  Pulmonary:     Effort: Pulmonary effort is normal.     Breath sounds: No stridor. No wheezing, rhonchi or rales.  Abdominal:     General: Abdomen is protuberant. Bowel sounds are normal. There is no distension.     Palpations: Abdomen is soft. There is no hepatomegaly or mass.     Tenderness: There is no abdominal tenderness.     Hernia: There is no hernia in the left inguinal area or right inguinal area.  Genitourinary:    Pubic Area: No rash.      Penis: Uncircumcised. No phimosis, paraphimosis, erythema, tenderness, discharge, swelling or lesions.      Testes: Normal.        Right: Mass not present.        Left: Mass not present.     Epididymis:     Right: Normal. Not inflamed or enlarged.     Left: Normal. Not inflamed or enlarged.  Musculoskeletal:        General: Normal range of motion.     Cervical back: Neck supple.     Right lower leg: No edema.     Left lower leg: No edema.  Lymphadenopathy:     Cervical: No cervical adenopathy.     Lower Body: No right inguinal adenopathy. No left inguinal adenopathy.  Skin:    General: Skin  is warm and dry.  Neurological:     General: No focal deficit present.     Mental Status: He is alert.  Psychiatric:        Mood and Affect: Mood normal.        Behavior: Behavior normal.        Thought Content: Thought content normal.        Judgment: Judgment normal.       Lab Results  Component Value Date   WBC 9.3 10/11/2019   HGB 14.8 10/11/2019   HCT 42.7 10/11/2019   PLT 251 10/11/2019   GLUCOSE 160 (H) 01/05/2020   CHOL 183 10/12/2019   TRIG 232.0 (H) 01/05/2020   HDL 31.80 (L) 10/12/2019   LDLDIRECT 106.0 10/12/2019   LDLCALC 128 (H) 12/07/2018   ALT 24 10/11/2019   AST 16 10/11/2019   NA 139 01/05/2020   K 3.3 (L) 01/05/2020   CL 103 01/05/2020   CREATININE 1.31 01/05/2020   BUN 16 01/05/2020   CO2 26 01/05/2020   TSH 1.54 01/19/2019   PSA 0.63 10/12/2019   INR 1.1 (H) 04/12/2019   HGBA1C 6.0 10/12/2019   MICROALBUR 0.9 01/19/2019    No results found.  Assessment & Plan:   Duaine was seen today for exposure to std, hyperlipidemia and hypertension.  Diagnoses and all orders for this visit:  Exposure to trichomonas- Screening for HIV, gonorrhea, and chlamydia is negative.  I will empirically treat him for trichomonas with a 2 g dose of metronidazole. -     Cancel: Chlamydia/Gonococcus/Trichomonas, NAA; Future -     Cancel: Urinalysis, Routine w reflex microscopic -     metroNIDAZOLE (FLAGYL) 500 MG tablet; Take 4 tablets (2,000 mg total) by mouth once for 1 dose. -     Cancel: Chlamydia/Gonococcus/Trichomonas, NAA; Future -     Urine cytology ancillary only; Future -     Urine cytology ancillary only  Encounter for screening for HIV -     HIV Antibody (routine testing w rflx)  Essential hypertension- His blood pressure is adequately well controlled.  He is hypokalemic so I have asked him to restart the potassium supplement. -     Basic metabolic panel -     potassium chloride SA (KLOR-CON M20) 20 MEQ tablet; Take 2 tablets (40 mEq total) by mouth  daily.  Hypertriglyceridemia- His triglycerides remain mildly elevated.  I recommended that he continue to work on his lifestyle modifications. -     Triglycerides  Diuretic-induced hypokalemia -     potassium chloride SA (KLOR-CON M20) 20 MEQ tablet; Take 2 tablets (40 mEq total) by mouth daily.  Bipolar 1 disorder, depressed ([place])- Improvement noted.  Will continue Vraylar 3 mg a day.   I am having Marlin L. Nuno start on metroNIDAZOLE. I am also having him maintain his blood glucose meter kit and supplies, PRESCRIPTION MEDICATION, b complex vitamins, Insulin Pen Needle, sildenafil, metoprolol succinate, pantoprazole, spironolactone, Vitamin D3, clonazePAM, Xarelto, Tresiba FlexTouch, Vascepa, atorvastatin, losartan, Farxiga, torsemide, cariprazine, and potassium chloride SA.  Meds ordered this encounter  Medications  . metroNIDAZOLE (FLAGYL) 500 MG tablet    Sig: Take 4 tablets (2,000 mg total) by mouth once for 1 dose.    Dispense:  4 tablet    Refill:  0  . potassium chloride SA (KLOR-CON M20) 20 MEQ tablet    Sig: Take 2 tablets (40 mEq total) by mouth daily.    Dispense:  180 tablet    Refill:  0     Follow-up: Return in about 3 months (around 04/03/2020).  Scarlette Calico, MD

## 2020-01-05 NOTE — Progress Notes (Signed)
[id number] cytology -

## 2020-01-05 NOTE — Patient Instructions (Signed)
Trichomoniasis Trichomoniasis is an STI (sexually transmitted infection) that can affect both women and men. In women, the outer area of the male genitalia (vulva) and the vagina are affected. In men, mainly the penis is affected, but the prostate and other reproductive organs can also be involved.  This condition can be treated with medicine. It often has no symptoms (is asymptomatic), especially in men. If not treated, trichomoniasis can last for months or years. What are the causes? This condition is caused by a parasite called Trichomonas vaginalis. Trichomoniasis most often spreads from person to person (is contagious) through sexual contact. What increases the risk? The following factors may make you more likely to develop this condition:  Having unprotected sex.  Having sex with a partner who has trichomoniasis.  Having multiple sexual partners.  Having had previous trichomoniasis infections or other STIs. What are the signs or symptoms? In women, symptoms of trichomoniasis include:  Abnormal vaginal discharge that is clear, white, gray, or yellow-green and foamy and has an unusual "fishy" odor.  Itching and irritation of the vagina and vulva.  Burning or pain during urination or sex.  Redness and swelling of the genitals. In men, symptoms of trichomoniasis include:  Penile discharge that may be foamy or contain pus.  Pain in the penis. This may happen only when urinating.  Itching or irritation inside the penis.  Burning after urination or ejaculation. How is this diagnosed? In women, this condition may be found during a routine Pap test or physical exam. It may be found in men during a routine physical exam. Your health care provider may do tests to help diagnose this infection, such as:  Urine tests (men and women).  The following in women: ? Testing the pH of the vagina. ? A vaginal swab test that checks for the Trichomonas vaginalis parasite. ? Testing vaginal  secretions. Your health care provider may test you for other STIs, including HIV (human immunodeficiency virus). How is this treated? This condition is treated with medicine taken by mouth (orally), such as metronidazole or tinidazole, to fight the infection. Your sexual partner(s) also need to be tested and treated.  If you are a woman and you plan to become pregnant or think you may be pregnant, tell your health care provider right away. Some medicines that are used to treat the infection should not be taken during pregnancy. Your health care provider may recommend over-the-counter medicines or creams to help relieve itching or irritation. You may be tested for infection again 3 months after treatment. Follow these instructions at home:  Take and use over-the-counter and prescription medicines, including creams, only as told by your health care provider.  Take your antibiotic medicine as told by your health care provider. Do not stop taking the antibiotic even if you start to feel better.  Do not have sex until 7-10 days after you finish your medicine, or until your health care provider approves. Ask your health care provider when you may start to have sex again.  (Women) Do not douche or wear tampons while you have the infection.  Discuss your infection with your sexual partner(s). Make sure that your partner gets tested and treated, if necessary.  Keep all follow-up visits as told by your health care provider. This is important. How is this prevented?   Use condoms every time you have sex. Using condoms correctly and consistently can help protect against STIs.  Avoid having multiple sexual partners.  Talk with your sexual partner about any   symptoms that either of you may have, as well as any history of STIs.  Get tested for STIs and STDs (sexually transmitted diseases) before you have sex. Ask your partner to do the same.  Do not have sexual contact if you have symptoms of  trichomoniasis or another STI. Contact a health care provider if:  You still have symptoms after you finish your medicine.  You develop pain in your abdomen.  You have pain when you urinate.  You have bleeding after sex.  You develop a rash.  You feel nauseous or you vomit.  You plan to become pregnant or think you may be pregnant. Summary  Trichomoniasis is an STI (sexually transmitted infection) that can affect both women and men.  This condition often has no symptoms (is asymptomatic), especially in men.  Without treatment, this condition can last for months or years.  You should not have sex until 7-10 days after you finish your medicine, or until your health care provider approves. Ask your health care provider when you may start to have sex again.  Discuss your infection with your sexual partner(s). Make sure that your partner gets tested and treated, if necessary. This information is not intended to replace advice given to you by your health care provider. Make sure you discuss any questions you have with your health care provider. Document Revised: 08/31/2018 Document Reviewed: 08/31/2018 [employer] Patient Education  2020 [employer]  

## 2020-01-06 ENCOUNTER — Encounter: Payer: Self-pay | Admitting: Internal Medicine

## 2020-01-06 LAB — URINE CYTOLOGY ANCILLARY ONLY
Chlamydia: NEGATIVE
Comment: NEGATIVE
Comment: NORMAL
Neisseria Gonorrhea: NEGATIVE

## 2020-01-06 LAB — HIV ANTIBODY (ROUTINE TESTING W REFLEX): HIV 1&2 Ab, 4th Generation: NONREACTIVE

## 2020-01-11 ENCOUNTER — Ambulatory Visit (HOSPITAL_COMMUNITY)
Admission: RE | Admit: 2020-01-11 | Discharge: 2020-01-11 | Disposition: A | Discharge status: Home / Self Care | Payer: Medicare HMO | Source: Ambulatory Visit | Attending: Cardiology | Admitting: Cardiology

## 2020-01-11 ENCOUNTER — Other Ambulatory Visit: Payer: Self-pay

## 2020-01-11 ENCOUNTER — Encounter (HOSPITAL_COMMUNITY): Payer: Self-pay | Admitting: Cardiology

## 2020-01-11 VITALS — BP 128/78 | HR 78 | Wt 390.0 lb

## 2020-01-11 DIAGNOSIS — G4733 Obstructive sleep apnea (adult) (pediatric): Secondary | ICD-10-CM | POA: Insufficient documentation

## 2020-01-11 DIAGNOSIS — Z7901 Long term (current) use of anticoagulants: Secondary | ICD-10-CM | POA: Diagnosis not present

## 2020-01-11 DIAGNOSIS — F1021 Alcohol dependence, in remission: Secondary | ICD-10-CM | POA: Insufficient documentation

## 2020-01-11 DIAGNOSIS — I5022 Chronic systolic (congestive) heart failure: Secondary | ICD-10-CM | POA: Insufficient documentation

## 2020-01-11 DIAGNOSIS — Z79899 Other long term (current) drug therapy: Secondary | ICD-10-CM | POA: Diagnosis not present

## 2020-01-11 DIAGNOSIS — I480 Paroxysmal atrial fibrillation: Secondary | ICD-10-CM | POA: Insufficient documentation

## 2020-01-11 DIAGNOSIS — I454 Nonspecific intraventricular block: Secondary | ICD-10-CM | POA: Insufficient documentation

## 2020-01-11 DIAGNOSIS — I5042 Chronic combined systolic (congestive) and diastolic (congestive) heart failure: Secondary | ICD-10-CM | POA: Diagnosis not present

## 2020-01-11 DIAGNOSIS — Z888 Allergy status to other drugs, medicaments and biological substances status: Secondary | ICD-10-CM | POA: Diagnosis not present

## 2020-01-11 DIAGNOSIS — F329 Major depressive disorder, single episode, unspecified: Secondary | ICD-10-CM | POA: Diagnosis not present

## 2020-01-11 DIAGNOSIS — F410 Panic disorder [episodic paroxysmal anxiety] without agoraphobia: Secondary | ICD-10-CM | POA: Insufficient documentation

## 2020-01-11 DIAGNOSIS — I110 Hypertensive heart disease with heart failure: Secondary | ICD-10-CM | POA: Diagnosis not present

## 2020-01-11 DIAGNOSIS — E785 Hyperlipidemia, unspecified: Secondary | ICD-10-CM | POA: Insufficient documentation

## 2020-01-11 DIAGNOSIS — F32A Depression, unspecified: Secondary | ICD-10-CM

## 2020-01-11 DIAGNOSIS — F17210 Nicotine dependence, cigarettes, uncomplicated: Secondary | ICD-10-CM | POA: Diagnosis not present

## 2020-01-11 DIAGNOSIS — I428 Other cardiomyopathies: Secondary | ICD-10-CM | POA: Diagnosis not present

## 2020-01-11 DIAGNOSIS — Z794 Long term (current) use of insulin: Secondary | ICD-10-CM | POA: Diagnosis not present

## 2020-01-11 DIAGNOSIS — R9431 Abnormal electrocardiogram [ECG] [EKG]: Secondary | ICD-10-CM | POA: Insufficient documentation

## 2020-01-11 DIAGNOSIS — R69 Illness, unspecified: Secondary | ICD-10-CM | POA: Diagnosis not present

## 2020-01-11 DIAGNOSIS — G629 Polyneuropathy, unspecified: Secondary | ICD-10-CM | POA: Diagnosis not present

## 2020-01-11 MED ORDER — LOSARTAN POTASSIUM 50 MG PO TABS: 75.0000 mg | ORAL_TABLET | Freq: Every day | ORAL | 3 refills | Status: DC

## 2020-01-11 NOTE — Progress Notes (Signed)
Advanced Heart Failure Clinic Note   Primary Care:Dr. Scarlette Calico Primary Cardiologist: Dr. Aundra Dubin   HPI: [name] is a 54 y.o. male with a past medical history of NICM, EF 25-30% in March 2018, felt to be related to prior ETOH abuse. He also has a history of PE 03/2014 completed a years course of Xarelto, morbid obesity, OSA, and tobacco abuse.   He was admitted 11/12/15-11/14/15 with acute on chronic systolic CHF and palpitations. He wore a 30 day event monitor at discharge as he had frequent PVC's and questionable Afib on telemetry. Also with some NSVT, so he was started on Amiodaone, but at follow up had not started taking it. He had previously refused ICD and was seen inpatient by Dr. Rayann Heman who felt that his morbid obesity was a prohibitive factor.   He was seen in the clinic in April 2018. He had started drinking ETOH again. Volume status was stable, he is not an Entresto candidate due to angioedema with lisinopril. Weight was 411 pounds.   Admitted 04/12/17 with SOB, chest pain. D- dimer was 1.16, chest CT without central obstructing PE, however more peripheral and subsegmental pulmonary artery branches were not confidently evaluated due to his body habitus. He was started on a heparin gtt for presumed PE, however VQ scan showed no PE. Troponin was elevated, peaked at 3.37. LHC showed no CAD. Echo showed an EF of 15%, grade 2 DD, no pericarditis. He was diuresed with IV lasix, and started on torsemide '20mg'$  at discharge. Discharge weight was 405 pounds.   Admitted 5/22 through 04/24/2018 with abdominal pain, nausea, and vomiting. 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 Did not require surgical intervention. He will have follow up with GI.   Today he returns for HF follow up. He is no longer drinking ETOH but does smoke some.  Weight is down around 20 lbs since the last time he was seen in the office.  He has been working on his diet.  He is now divorced and living in [place].  He continues to  deal with anxiety and depression.  He generally does not get short of breath with his daily activities but has panic attacks involving dyspnea.  Short of breath walking up stairs.  He is using his Bipap at night.  No chest pain.  No lightheadedness.  No PND.   ECG (personally reviewed): NSR, IVCD 132 msec  Labs (5/13): K 4.1, creatinine 1.05 Labs (1/14): K 3.8, creatinine 1.16, BNP 54 Labs (2/14): K 3.7, creatinine 1.11, BNP 28 Labs (2/16): K 4.1, creatinine 1.03, LDL 96, HCT 40 Labs (3/16): K 3.7, K 1.13, BNP 367 Labs (8/16): BNP 43, K 3.5, creatinine 1.01 Labs (08/13/15): K 3.7, creatinine 1.18, HCT 41.3 Labs (12/16): K 3.7, creatinine 1.14, TGs 495, digoxin < 0.2 Labs (2/17): K 3.8, creatinine 0.99, LDL 107, TGs 222 Labs (5/17): K 4, creatinine 1.47 Labs (2/18): K 3.9, creatinine 1.06, hgb 15.1, TGs 163, LDL 89, HDL 28, TSH normal  Labs (5/18): K 3.8, creatinine 1.12.  Labs (12/30/2017): K 3.8 Creatinine 1.25  Labs (01/28/2018): K 3.8 Creatinine 1.08 Labs 03/19/2018: K 4.1 Creatinine 1/15 BNP 212  Labs 04/29/3018: Creatinine 1.1  Labs (10/19): LDL 166, K 3.8, creatinine 1.01, AST 102 => 25, ALT 125 => 37, alkaline phosphatase 233 Labs (11/20): LDL 106, TGs 232 Labs (2/21): K 3.3, creatinine 1.3  PMH: 1. Nonischemic cardiomyopathy: Prior cath with no significant CAD.  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  Prior echoes with EF as low as 25%.  Echo (9/13) with EF 35-40%, moderate to severe LV dilation, diffuse hypokinesis, mild MR. Echo (5/15) with EF 30-35%, moderate to severe LAE, normal RV size and systolic function.  Angioedema with ACEI, headaches with hydralazine/nitrates. Echo (3/16) with EF 25-30%, severe LV dilation, normal RV size and systolic function.  [facility] 08/13/15 showed no significant coronary disease; RA mean 6, PA 33/11 mean 23, PCWP mean 13, Fick CO/CI 4.75 /1.68 (difficult study, radial artery spasm, if needs future cath would use  groin).  Echo (9/16) showed EF 20-25%.   - Echo (3/18): EF 25-30%, moderate LAE - Echo 4/18 EF 15% - Echo 7/19 EF 20-25% 2. HTN: angioedema with ACEI.  3. OSA: on Bipap 4. Morbid obesity 5. Paroxysmal atrial fibrillation: Not documented recently.   6. Smoker.  7. Anxiety/panic attacks 8. PE: 5/15, diagnosed by V/Q scan. CTA chest 8/16 negative for PE.  9. NSVT, PVCs: 30 day monitor (12/16) with PVCs, PACs, no atrial fibrillation.  - Zio patch (9/19): few short NSVT runs, no atrial fibrillation, 1.1% PVCs 10. Hematuria: Apparently had negative workup by urology.  11. ABIs (6/16) were normal 12. 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  13. Gout 14. Low back pain.   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  Has son and daughter.    FH: No premature CAD.    Review of systems complete and found to be negative unless listed in HPI.    Current Outpatient Medications  Medication Sig Dispense Refill  . atorvastatin (LIPITOR) 40 MG tablet Take 1 tablet (40 mg total) by mouth daily. 90 tablet 1  . b complex vitamins tablet Take 1 tablet by mouth daily at 12 noon.    . blood glucose meter kit and supplies KIT Use to check blood sugar. DX E11.9 1 each 0  . cariprazine (VRAYLAR) capsule Take 1 capsule (3 mg total) by mouth daily. 56 capsule 0  . Cholecalciferol (VITAMIN D3) 1.25 MG (50000 UT) CAPS TAKE 1 CAPSULE BY MOUTH ONE TIME PER WEEK 12 capsule 0  . clonazePAM (KLONOPIN) 1 MG tablet TAKE 1 TABLET (1 MG TOTAL) BY MOUTH 2 (TWO) TIMES DAILY AS NEEDED FOR ANXIETY. 60 tablet 3  . FARXIGA 10 MG TABS tablet TAKE 1 TABLET BY MOUTH EVERY DAY 90 tablet 1  . icosapent Ethyl (VASCEPA) 1 g capsule Take 2 g by mouth daily.    Marland Kitchen losartan (COZAAR) 50 MG tablet Take 1.5 tablets (75 mg total) by mouth at bedtime. 45 tablet 3  . metoprolol succinate (TOPROL-XL) 100 MG 24 hr tablet TAKE 1 TABLET (100 MG TOTAL) BY MOUTH DAILY. TAKE WITH OR IMMEDIATELY  FOLLOWING A MEAL. 90 tablet 3  . pantoprazole (PROTONIX) 40 MG tablet TAKE 1 TABLET (40 MG TOTAL) BY MOUTH DAILY. 90 tablet 1  . potassium chloride SA (KLOR-CON M20) 20 MEQ tablet Take 2 tablets (40 mEq total) by mouth daily. 180 tablet 0  . PRESCRIPTION MEDICATION Inhale into the lungs See admin instructions. Bipap, pressure 16/12 with 2L of O2 - use whenever sleeping    . sildenafil (REVATIO) 20 MG tablet Take 4 tablets (80 mg total) by mouth daily as needed. 60 tablet 5  . spironolactone (ALDACTONE) 25 MG tablet TAKE 1 TABLET BY MOUTH EVERY DAY 90 tablet 3  . torsemide (DEMADEX) 20 MG tablet Take 20-30 mg by mouth daily.    Alveda Reasons 20 MG TABS  tablet TAKE 1 TABLET BY MOUTH EVERY DAY 30 tablet 5  . Insulin Degludec (TRESIBA FLEXTOUCH) 200 UNIT/ML SOPN Inject 26 Units into the skin daily. (Patient not taking: Reported on 01/11/2020) 9 mL 1  . Insulin Pen Needle 32G X 8 MM MISC Use as directed (Patient not taking: Reported on 01/11/2020) 100 each 0   No current facility-administered medications for this encounter.    Allergies  Allergen Reactions  . Ace Inhibitors Anaphylaxis and Swelling  . Buspirone Other (See Comments)    dizziness      Social History   Socioeconomic History  . Marital status: Married    Spouse name: Not on file  . Number of children: 3  . Years of education: Not on file  . Highest education level: Not on file  Occupational History  . Occupation: DISABLED  Tobacco Use  . Smoking status: Current Every Day Smoker    Packs/day: 0.50    Years: 30.00    Pack years: 15.00    Types: Cigarettes  . Smokeless tobacco: Never Used  Substance and Sexual Activity  . Alcohol use: Not Currently    Alcohol/week: 0.0 standard drinks  . Drug use: No  . Sexual activity: Not Currently  Other Topics Concern  . Not on file  Social History Narrative   He smokes about a pack per day and he has been smoking since he was 54 years of age.  He drinks alcohol occasionally, but he  denies any illicit drug abuse.  He is presently on disability.    Lives with wife in a 2 story home.  Has 2 children.   Previously worked in [employer], last worked in 1998.   Highest level of education:  11th grade           Social Determinants of Health   Financial Resource Strain:   . Difficulty of Paying Living Expenses: Not on file  Food Insecurity:   . Worried About Charity fundraiser in the Last Year: Not on file  . Ran Out of Food in the Last Year: Not on file  Transportation Needs:   . Lack of Transportation (Medical): Not on file  . Lack of Transportation (Non-Medical): Not on file  Physical Activity:   . Days of Exercise per Week: Not on file  . Minutes of Exercise per Session: Not on file  Stress:   . Feeling of Stress : Not on file  Social Connections:   . Frequency of Communication with Friends and Family: Not on file  . Frequency of Social Gatherings with Friends and Family: Not on file  . Attends Religious Services: Not on file  . Active Member of Clubs or Organizations: Not on file  . Attends Archivist Meetings: Not on file  . Marital Status: Not on file  Intimate Partner Violence:   . Fear of Current or Ex-Partner: Not on file  . Emotionally Abused: Not on file  . Physically Abused: Not on file  . Sexually Abused: Not on file      Family History  Problem Relation Age of Onset  . Cancer Mother        brain tumor  . Hypertension Mother   . Diabetes Father        Deceased, 40  . Heart disease Maternal Grandmother   . Hypertension Other        Family History  . Stroke Other        Family History  . Diabetes Other  Family History  . Diabetes Daughter     Vitals:   01/11/20 1442  BP: 128/78  Pulse: 78  SpO2: 97%  Weight: (!) 176.9 kg (390 lb)   Filed Weights   01/11/20 1442  Weight: (!) 176.9 kg (390 lb)   Wt Readings from Last 3 Encounters:  01/11/20 (!) 176.9 kg (390 lb)  01/05/20 (!) 176 kg (388 lb)  12/21/19 (!) 173.8  kg (383 lb 4 oz)    PHYSICAL EXAM: General: NAD, obese.  Neck: Thick, JVP 8 cm, no thyromegaly or thyroid nodule.  Lungs: Clear to auscultation bilaterally with normal respiratory effort. CV: Nondisplaced PMI.  Heart regular S1/S2, no S3/S4, no murmur.  No peripheral edema.  No carotid bruit.  Normal pedal pulses.  Abdomen: Soft, nontender, no hepatosplenomegaly, no distention.  Skin: Intact without lesions or rashes.  Neurologic: Alert and oriented x 3.  Psych: Normal affect. Extremities: No clubbing or cyanosis.  HEENT: Normal.   ASSESSMENT & PLAN: 1. Chronic systolic CHF: Nonischemic cardiomyopathy. ?If ETOH has played a role. Echo (3/18) with EF 25-30%. He was seen by EP and decided against ICD given marked obesity.  QRS not wide enough for CRT. Echo 05/2018 with EF 20-25%. NYHA class II-III symptoms. He looks no more than mildly volume overloaded on exam.  - Continue torsemide 20 mg daily. BMET today.  - Continue Spiro 25 mg daily - Continue Toprol XL 100 daily.  - Continue dapagliflozin.  - Increase losartan to 75 mg daily.  BMET 10 days.   - No entresto with h/o angioedema.  - Had side effects from digoxin, does not want to take. - Headaches with Bidil, cannot take.  - I will arrange for repeat echo. May see some improvement if ETOH was part of the cause of cardiomyopathy.  2. Obesity: Continue to work on weight loss.  3. Smoking: Smoking 1 ppd. Encouraged cessation. No change. 4. OSA: Continue nightly BiPAP. 5. Paroxysmal atrial fibrillation: None documented recently.  He is on Xarelto.  6. PE: 04/14/2018, diagnosed by V/Q scan. He remains on Xarelto.  7. Anxiety/Panic attacks/depression: He is on Engineer, maintenance (IT).  - I think it may be helpful to have psychiatry followup, he continues to have a lot of anxiety.  Will refer. 8. Hyperlipidemia: Continue atorvastatin.   Followup in 3 months.   [name] 01/11/2020

## 2020-01-11 NOTE — Patient Instructions (Signed)
INCREASE Losartan to 75mg  (1.5) tabs every night before bed   Labs in 10 days We will only contact you if something comes back abnormal or we need to make some changes. Otherwise no news is good news!   You have been referred to psychiatry.  They will give you a call to schedule this appointment   Your physician has requested that you have an echocardiogram. Echocardiography is a painless test that uses sound waves to create images of your heart. It provides your doctor with information about the size and shape of your heart and how well your heart's chambers and valves are working. This procedure takes approximately one hour. There are no restrictions for this procedure.   Your physician recommends that you schedule a follow-up appointment in: 3 months with [name]   Please call office at [phone] option 2 if you have any questions or concerns.    At the [facility], you and your health needs are our priority. As part of our continuing mission to provide you with exceptional heart care, we have created designated Provider Care Teams. These Care Teams include your primary Cardiologist (physician) and Advanced Practice Providers (APPs- Physician Assistants and Nurse Practitioners) who all work together to provide you with the care you need, when you need it.   You may see any of the following providers on your designated Care Team at your next follow up: Marland Kitchen Dr Glori Bickers . Dr [name] . [name], NP . Lyda Jester, PA . Audry Riles, PharmD   Please be sure to bring in all your medications bottles to every appointment.

## 2020-01-12 ENCOUNTER — Encounter (HOSPITAL_COMMUNITY): Payer: Medicare HMO | Admitting: Cardiology

## 2020-01-22 ENCOUNTER — Other Ambulatory Visit: Payer: Self-pay | Admitting: Internal Medicine

## 2020-01-22 DIAGNOSIS — E559 Vitamin D deficiency, unspecified: Secondary | ICD-10-CM

## 2020-01-22 MED ORDER — VITAMIN D3 1.25 MG (50000 UT) PO CAPS: ORAL_CAPSULE | ORAL | 0 refills | Status: DC

## 2020-01-23 ENCOUNTER — Other Ambulatory Visit: Payer: Self-pay

## 2020-01-23 ENCOUNTER — Ambulatory Visit (HOSPITAL_COMMUNITY)
Admission: RE | Admit: 2020-01-23 | Discharge: 2020-01-23 | Disposition: A | Discharge status: Home / Self Care | Payer: Medicare HMO | Source: Ambulatory Visit | Attending: Cardiology | Admitting: Cardiology

## 2020-01-23 DIAGNOSIS — Z86711 Personal history of pulmonary embolism: Secondary | ICD-10-CM | POA: Diagnosis not present

## 2020-01-23 DIAGNOSIS — I071 Rheumatic tricuspid insufficiency: Secondary | ICD-10-CM | POA: Diagnosis not present

## 2020-01-23 DIAGNOSIS — F17200 Nicotine dependence, unspecified, uncomplicated: Secondary | ICD-10-CM | POA: Insufficient documentation

## 2020-01-23 DIAGNOSIS — I429 Cardiomyopathy, unspecified: Secondary | ICD-10-CM | POA: Insufficient documentation

## 2020-01-23 DIAGNOSIS — E785 Hyperlipidemia, unspecified: Secondary | ICD-10-CM | POA: Diagnosis not present

## 2020-01-23 DIAGNOSIS — I110 Hypertensive heart disease with heart failure: Secondary | ICD-10-CM | POA: Insufficient documentation

## 2020-01-23 DIAGNOSIS — R69 Illness, unspecified: Secondary | ICD-10-CM | POA: Diagnosis not present

## 2020-01-23 DIAGNOSIS — I5042 Chronic combined systolic (congestive) and diastolic (congestive) heart failure: Secondary | ICD-10-CM

## 2020-01-23 DIAGNOSIS — I4891 Unspecified atrial fibrillation: Secondary | ICD-10-CM | POA: Insufficient documentation

## 2020-01-23 DIAGNOSIS — E119 Type 2 diabetes mellitus without complications: Secondary | ICD-10-CM | POA: Diagnosis not present

## 2020-01-23 LAB — BASIC METABOLIC PANEL
Anion gap: 11 (ref 5–15)
BUN: 13 mg/dL (ref 6–20)
CO2: 27 mmol/L (ref 22–32)
Calcium: 9.4 mg/dL (ref 8.9–10.3)
Chloride: 102 mmol/L (ref 98–111)
Creatinine, Ser: 1.19 mg/dL (ref 0.61–1.24)
GFR calc Af Amer: >60 mL/min (ref >60)
GFR calc non Af Amer: >60 mL/min (ref >60)
Glucose, Bld: 132 mg/dL — ABNORMAL HIGH (ref 70–99)
Potassium: 3.8 mmol/L (ref 3.5–5.1)
Sodium: 140 mmol/L (ref 135–145)

## 2020-01-23 NOTE — Progress Notes (Signed)
  Echocardiogram 2D Echocardiogram has been performed.  Michiel Cowboy 01/23/2020, 12:06 PM

## 2020-01-24 ENCOUNTER — Other Ambulatory Visit: Payer: Self-pay | Admitting: Internal Medicine

## 2020-01-24 DIAGNOSIS — G4733 Obstructive sleep apnea (adult) (pediatric): Secondary | ICD-10-CM | POA: Diagnosis not present

## 2020-01-24 DIAGNOSIS — E559 Vitamin D deficiency, unspecified: Secondary | ICD-10-CM

## 2020-01-24 MED ORDER — VITAMIN D3 1.25 MG (50000 UT) PO CAPS: ORAL_CAPSULE | ORAL | 0 refills | Status: DC

## 2020-01-31 ENCOUNTER — Encounter (HOSPITAL_COMMUNITY): Payer: Self-pay | Admitting: Cardiology

## 2020-02-02 ENCOUNTER — Telehealth (HOSPITAL_COMMUNITY): Payer: Self-pay

## 2020-02-03 NOTE — Telephone Encounter (Signed)
Note made for pt to exercise

## 2020-02-10 ENCOUNTER — Telehealth: Payer: Self-pay | Admitting: Internal Medicine

## 2020-02-10 ENCOUNTER — Ambulatory Visit (HOSPITAL_COMMUNITY)
Admission: RE | Admit: 2020-02-10 | Discharge: 2020-02-10 | Disposition: A | Discharge status: Home / Self Care | Payer: Medicare HMO | Attending: Psychiatry | Admitting: Psychiatry

## 2020-02-10 DIAGNOSIS — Z86711 Personal history of pulmonary embolism: Secondary | ICD-10-CM | POA: Insufficient documentation

## 2020-02-10 DIAGNOSIS — F329 Major depressive disorder, single episode, unspecified: Secondary | ICD-10-CM | POA: Insufficient documentation

## 2020-02-10 DIAGNOSIS — F419 Anxiety disorder, unspecified: Secondary | ICD-10-CM | POA: Diagnosis not present

## 2020-02-10 DIAGNOSIS — I509 Heart failure, unspecified: Secondary | ICD-10-CM | POA: Diagnosis not present

## 2020-02-10 DIAGNOSIS — R69 Illness, unspecified: Secondary | ICD-10-CM | POA: Diagnosis not present

## 2020-02-10 NOTE — Telephone Encounter (Signed)
Called pt and he stated that he is having racing thoughts and that he felt very unstable. Pt stated that wasn't sure what he was going to do and that he hasn't been able to sleep. He stated that he has an appointment coming up in April but felt that he needed help right now.   I instructed pt to go to [facility] hospital. Pt agreed and will have someone take him now.

## 2020-02-10 NOTE — Telephone Encounter (Signed)
New message:   Pt is calling and states he needs to speak to CMA about seeing a psychologist. Please advise.

## 2020-02-10 NOTE — H&P (Signed)
Behavioral Health Medical Screening Exam  [name] is an 54 y.o. male with depression. He is currently being managed by his PCP for his depressive symptoms and chronic anxiety. He reports that his anxiety has increased due to his ongoing medical conditions to include CHF, PE, OSA, and cardiomyapthy. He states at times it is hard to differentiate from anxiety vs cardiovascular symptoms. He is requesting medication assistance to help control his anxiety. We discussed provoking factors as well as protective factors to help him. We reviewed coping skills in addition to all appropriate available and accessible treatment options. He denied suicidal ideation, homicidal ideation, and or hallucinations. He does not appear to be responding to internal stimuli. We agreed together that Partial Hospitalization, would be a great option for him for stabilization, medication management, daily interaction with a psychiatrist as well as group therapy that is offered in a virtual setting to promote overall improvement in his mental health and well being. He remained future oriented and he is congratulated on his 100 lb weight loss which has improved his CHF. He states he wants to lose another 100lbs, in which we discussed positive coping skills and positive affirmations.   Total Time spent with patient: 45 minutes  Psychiatric Specialty Exam: Physical Exam  Nursing note and vitals reviewed.   Review of Systems  There were no vitals taken for this visit.There is no height or weight on file to calculate BMI.  General Appearance: Fairly Groomed  Eye Contact:  Fair  Speech:  Clear and Coherent and Normal Rate  Volume:  Normal  Mood:  Depressed  Affect:  Appropriate and Flat  Thought Process:  Coherent, Linear and Descriptions of Associations: Intact  Orientation:  Full (Time, Place, and Person)  Thought Content:  Logical  Suicidal Thoughts:  No  Homicidal Thoughts:  No  Memory:  Immediate;   Fair Recent;    Fair  Judgement:  Intact  Insight:  Fair  Psychomotor Activity:  Normal  Concentration: Concentration: Fair and Attention Span: Fair  Recall:  [employer] of Knowledge:Fair  Language: Fair  Akathisia:  No  Handed:  Right  AIMS (if indicated):     Assets:  Communication Skills Desire for Improvement Financial Resources/Insurance Leisure Time Physical Health Social Support Vocational/Educational  Sleep:       Musculoskeletal: Strength & Muscle Tone: within normal limits Gait & Station: normal Patient leans: N/A  There were no vitals taken for this visit.  Recommendations:  Based on my evaluation the patient does not appear to have an emergency medical condition. Refer to PHP. Secure message sent to [name] to contact patient. Please continue to take medications as directed. If your symptoms return, worsen, or persist please call your 911, report to local ER, or contact crisis hotline. Please do not drink alcohol or use any illegal substances while taking prescription medications.   Suella Broad, FNP 02/10/2020, 8:05 PM
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
## 2020-02-15 NOTE — Progress Notes (Signed)
See telephone message 3/4

## 2020-02-20 ENCOUNTER — Telehealth (HOSPITAL_COMMUNITY): Payer: Self-pay | Admitting: Psychiatry

## 2020-02-20 NOTE — Telephone Encounter (Signed)
D:  Pt phoned earlier, requesting a sooner appointment than 03-15-20 with Dr. Jean Rosenthal.  A:  Discussed with Dr. Toy Care and she contacted Trilby Drummer (office mgr) re: rescheduling.  Pt's new appt will be 03-05-20 @ 9 a.m with Dr. Jean Rosenthal.  Contacted to inform patient.  R:  Pt receptive.

## 2020-02-21 DIAGNOSIS — G4733 Obstructive sleep apnea (adult) (pediatric): Secondary | ICD-10-CM | POA: Diagnosis not present

## 2020-02-23 ENCOUNTER — Other Ambulatory Visit: Payer: Self-pay | Admitting: Internal Medicine

## 2020-02-23 DIAGNOSIS — K219 Gastro-esophageal reflux disease without esophagitis: Secondary | ICD-10-CM

## 2020-03-05 ENCOUNTER — Encounter: Payer: Self-pay | Admitting: Psychiatry

## 2020-03-05 ENCOUNTER — Other Ambulatory Visit: Payer: Self-pay

## 2020-03-05 ENCOUNTER — Ambulatory Visit (INDEPENDENT_AMBULATORY_CARE_PROVIDER_SITE_OTHER): Payer: Medicare HMO | Admitting: Psychiatry

## 2020-03-05 DIAGNOSIS — F3130 Bipolar disorder, current episode depressed, mild or moderate severity, unspecified: Secondary | ICD-10-CM

## 2020-03-05 DIAGNOSIS — R69 Illness, unspecified: Secondary | ICD-10-CM | POA: Diagnosis not present

## 2020-03-05 DIAGNOSIS — F410 Panic disorder [episodic paroxysmal anxiety] without agoraphobia: Secondary | ICD-10-CM

## 2020-03-05 MED ORDER — SERTRALINE HCL 50 MG PO TABS: 50.0000 mg | ORAL_TABLET | Freq: Every day | ORAL | 1 refills | Status: DC

## 2020-03-05 MED ORDER — ARIPIPRAZOLE 5 MG PO TABS: 5.0000 mg | ORAL_TABLET | Freq: Every day | ORAL | 1 refills | Status: DC

## 2020-03-05 MED ORDER — CLONAZEPAM 1 MG PO TABS: 1.0000 mg | ORAL_TABLET | Freq: Two times a day (BID) | ORAL | 1 refills | Status: DC | PRN
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
## 2020-03-15 ENCOUNTER — Ambulatory Visit: Payer: Medicare HMO | Admitting: Psychiatry

## 2020-03-16 ENCOUNTER — Telehealth: Payer: Self-pay | Admitting: Internal Medicine

## 2020-03-16 NOTE — Telephone Encounter (Signed)
Pt contacted and stated that he was tasting blood and his nose was bleeding. He was NOT peeing blood.   Pt stated that it has happened twice and only from one nostril. Pt agreed to come in for follow up. Appt has been scheduled.

## 2020-03-16 NOTE — Telephone Encounter (Signed)
New message:   Pt is calling and states last night he was peeing blood and then when he woke up this morning he had a real bad nose bleed. Pt states he stopped taking his losartan and wants to know if this should be happening. Please advise.

## 2020-03-21 ENCOUNTER — Other Ambulatory Visit: Payer: Self-pay

## 2020-03-21 ENCOUNTER — Ambulatory Visit (INDEPENDENT_AMBULATORY_CARE_PROVIDER_SITE_OTHER): Payer: Medicare HMO | Admitting: Internal Medicine

## 2020-03-21 ENCOUNTER — Encounter: Payer: Self-pay | Admitting: Internal Medicine

## 2020-03-21 VITALS — BP 120/80 | HR 75 | Temp 98.0°F | Ht 69.0 in | Wt 388.0 lb

## 2020-03-21 DIAGNOSIS — E785 Hyperlipidemia, unspecified: Secondary | ICD-10-CM | POA: Diagnosis not present

## 2020-03-21 DIAGNOSIS — I5042 Chronic combined systolic (congestive) and diastolic (congestive) heart failure: Secondary | ICD-10-CM

## 2020-03-21 DIAGNOSIS — I480 Paroxysmal atrial fibrillation: Secondary | ICD-10-CM

## 2020-03-21 DIAGNOSIS — I2699 Other pulmonary embolism without acute cor pulmonale: Secondary | ICD-10-CM | POA: Diagnosis not present

## 2020-03-21 DIAGNOSIS — N522 Drug-induced erectile dysfunction: Secondary | ICD-10-CM | POA: Diagnosis not present

## 2020-03-21 DIAGNOSIS — E118 Type 2 diabetes mellitus with unspecified complications: Secondary | ICD-10-CM | POA: Diagnosis not present

## 2020-03-21 DIAGNOSIS — Z1211 Encounter for screening for malignant neoplasm of colon: Secondary | ICD-10-CM

## 2020-03-21 DIAGNOSIS — K760 Fatty (change of) liver, not elsewhere classified: Secondary | ICD-10-CM | POA: Diagnosis not present

## 2020-03-21 DIAGNOSIS — E781 Pure hyperglyceridemia: Secondary | ICD-10-CM | POA: Diagnosis not present

## 2020-03-21 LAB — HEPATIC FUNCTION PANEL
ALT: 29 U/L (ref 0–53)
AST: 23 U/L (ref 0–37)
Albumin: 3.8 g/dL (ref 3.5–5.2)
Alkaline Phosphatase: 90 U/L (ref 39–117)
Bilirubin, Direct: 0.1 mg/dL (ref 0.0–0.3)
Total Bilirubin: 0.5 mg/dL (ref 0.2–1.2)
Total Protein: 7.3 g/dL (ref 6.0–8.3)

## 2020-03-21 LAB — BASIC METABOLIC PANEL
BUN: 16 mg/dL (ref 6–23)
CO2: 27 mEq/L (ref 19–32)
Calcium: 9.1 mg/dL (ref 8.4–10.5)
Chloride: 106 mEq/L (ref 96–112)
Creatinine, Ser: 1.22 mg/dL (ref 0.40–1.50)
GFR: 75.63 mL/min (ref >60.00)
Glucose, Bld: 123 mg/dL — ABNORMAL HIGH (ref 70–99)
Potassium: 3.6 mEq/L (ref 3.5–5.1)
Sodium: 139 mEq/L (ref 135–145)

## 2020-03-21 LAB — LIPID PANEL
Cholesterol: 185 mg/dL (ref 0–200)
HDL: 28 mg/dL — ABNORMAL LOW (ref >39.00)
Total CHOL/HDL Ratio: 7
Triglycerides: 446 mg/dL — ABNORMAL HIGH (ref 0.0–149.0)

## 2020-03-21 LAB — TSH: TSH: 1.27 u[IU]/mL (ref 0.35–4.50)

## 2020-03-21 LAB — LDL CHOLESTEROL, DIRECT: Direct LDL: 114 mg/dL

## 2020-03-21 LAB — PROTIME-INR
INR: 1.1 ratio — ABNORMAL HIGH (ref 0.8–1.0)
Prothrombin Time: 12.7 s (ref 9.6–13.1)

## 2020-03-21 MED ORDER — FARXIGA 10 MG PO TABS: 10.0000 mg | ORAL_TABLET | Freq: Every day | ORAL | 1 refills | Status: DC

## 2020-03-21 MED ORDER — RIVAROXABAN 20 MG PO TABS: 20.0000 mg | ORAL_TABLET | Freq: Every day | ORAL | 1 refills | Status: DC

## 2020-03-21 MED ORDER — ATORVASTATIN CALCIUM 40 MG PO TABS: 40.0000 mg | ORAL_TABLET | Freq: Every day | ORAL | 1 refills | Status: DC

## 2020-03-21 MED ORDER — RIVAROXABAN 20 MG PO TABS: 20.0000 mg | ORAL_TABLET | Freq: Every day | ORAL | 5 refills | Status: DC

## 2020-03-21 MED ORDER — SILDENAFIL CITRATE 20 MG PO TABS: 80.0000 mg | ORAL_TABLET | Freq: Every day | ORAL | 5 refills | Status: DC | PRN

## 2020-03-21 NOTE — Patient Instructions (Signed)
Type 2 Diabetes Mellitus, Diagnosis, Adult Type 2 diabetes (type 2 diabetes mellitus) is a long-term (chronic) disease. In type 2 diabetes, one or both of these problems may be present:  The pancreas does not make enough of a hormone called insulin.  Cells in the body do not respond properly to insulin that the body makes (insulin resistance). Normally, insulin allows blood sugar (glucose) to enter cells in the body. The cells use glucose for energy. Insulin resistance or lack of insulin causes excess glucose to build up in the blood instead of going into cells. As a result, high blood glucose (hyperglycemia) develops. What increases the risk? The following factors may make you more likely to develop type 2 diabetes:  Having a family member with type 2 diabetes.  Being overweight or obese.  Having an inactive (sedentary) lifestyle.  Having been diagnosed with insulin resistance.  Having a history of prediabetes, gestational diabetes, or polycystic ovary syndrome (PCOS).  Being of American-Indian, African-American, Hispanic/Latino, or Asian/Pacific Islander descent. What are the signs or symptoms? In the early stage of this condition, you may not have symptoms. Symptoms develop slowly and may include:  Increased thirst (polydipsia).  Increased hunger(polyphagia).  Increased urination (polyuria).  Increased urination during the night (nocturia).  Unexplained weight loss.  Frequent infections that keep coming back (recurring).  Fatigue.  Weakness.  Vision changes, such as blurry vision.  Cuts or bruises that are slow to heal.  Tingling or numbness in the hands or feet.  Dark patches on the skin (acanthosis nigricans). How is this diagnosed? This condition is diagnosed based on your symptoms, your medical history, a physical exam, and your blood glucose level. Your blood glucose may be checked with one or more of the following blood tests:  A fasting blood glucose (FBG)  test. You will not be allowed to eat (you will fast) for 8 hours or longer before a blood sample is taken.  A random blood glucose test. This test checks blood glucose at any time of day regardless of when you ate.  An A1c (hemoglobin A1c) blood test. This test provides information about blood glucose control over the previous 2-3 months.  An oral glucose tolerance test (OGTT). This test measures your blood glucose at two times: ? After fasting. This is your baseline blood glucose level. ? Two hours after drinking a beverage that contains glucose. You may be diagnosed with type 2 diabetes if:  Your FBG level is 126 mg/dL (7.0 mmol/L) or higher.  Your random blood glucose level is 200 mg/dL (11.1 mmol/L) or higher.  Your A1c level is 6.5% or higher.  Your OGTT result is higher than 200 mg/dL (11.1 mmol/L). These blood tests may be repeated to confirm your diagnosis. How is this treated? Your treatment may be managed by a specialist called an endocrinologist. Type 2 diabetes may be treated by following instructions from your health care provider about:  Making diet and lifestyle changes. This may include: ? Following an individualized nutrition plan that is developed by a diet and nutrition specialist (registered dietitian). ? Exercising regularly. ? Finding ways to manage stress.  Checking your blood glucose level as often as told.  Taking diabetes medicines or insulin daily. This helps to keep your blood glucose levels in the healthy range. ? If you use insulin, you may need to adjust the dosage depending on how physically active you are and what foods you eat. Your health care provider will tell you how to adjust your dosage.    Taking medicines to help prevent complications from diabetes, such as: ? Aspirin. ? Medicine to lower cholesterol. ? Medicine to control blood pressure. Your health care provider will set individualized treatment goals for you. Your goals will be based on  your age, other medical conditions you have, and how you respond to diabetes treatment. Generally, the goal of treatment is to maintain the following blood glucose levels:  Before meals (preprandial): 80-130 mg/dL (4.4-7.2 mmol/L).  After meals (postprandial): below 180 mg/dL (10 mmol/L).  A1c level: less than 7%. Follow these instructions at home: Questions to ask your health care provider  Consider asking the following questions: ? Do I need to meet with a diabetes educator? ? Where can I find a support group for people with diabetes? ? What equipment will I need to manage my diabetes at home? ? What diabetes medicines do I need, and when should I take them? ? How often do I need to check my blood glucose? ? What number can I call if I have questions? ? When is my next appointment? General instructions  Take over-the-counter and prescription medicines only as told by your health care provider.  Keep all follow-up visits as told by your health care provider. This is important.  For more information about diabetes, visit: ? American Diabetes Association (ADA): www.diabetes.org ? American Association of Diabetes Educators (AADE): www.diabeteseducator.org Contact a health care provider if:  Your blood glucose is at or above 240 mg/dL (13.3 mmol/L) for 2 days in a row.  You have been sick or have had a fever for 2 days or longer, and you are not getting better.  You have any of the following problems for more than 6 hours: ? You cannot eat or drink. ? You have nausea and vomiting. ? You have diarrhea. Get help right away if:  Your blood glucose is lower than 54 mg/dL (3.0 mmol/L).  You become confused or you have trouble thinking clearly.  You have difficulty breathing.  You have moderate or large ketone levels in your urine. Summary  Type 2 diabetes (type 2 diabetes mellitus) is a long-term (chronic) disease. In type 2 diabetes, the pancreas does not make enough of a  hormone called insulin, or cells in the body do not respond properly to insulin that the body makes (insulin resistance).  This condition is treated by making diet and lifestyle changes and taking diabetes medicines or insulin.  Your health care provider will set individualized treatment goals for you. Your goals will be based on your age, other medical conditions you have, and how you respond to diabetes treatment.  Keep all follow-up visits as told by your health care provider. This is important. This information is not intended to replace advice given to you by your health care provider. Make sure you discuss any questions you have with your health care provider. Document Revised: 01/15/2018 Document Reviewed: 12/21/2015 Elsevier Patient Education  2020 [employer]  

## 2020-03-21 NOTE — Progress Notes (Signed)
Subjective:  Patient ID: [name], male    DOB: 1966-06-29  Age: 54 y.o. MRN: [id number]  CC: Diabetes  This visit occurred during the SARS-CoV-2 public health emergency.  Safety protocols were in place, including screening questions prior to the visit, additional usage of staff PPE, and extensive cleaning of exam room while observing appropriate contact time as indicated for disinfecting solutions.   HPI [name] presents for f/up - Since I last saw him he has been seeing a psychiatrist and tells me he is doing well on the combination of clonazepam and quetiapine.  He has mild insomnia but is able to get at least 6 or 7 hours of sleep at night.  He has been working on his lifestyle modifications but has not been able to lose weight.  About 5 days ago he had a brief nosebleed on the left side but since then has not noticed any more nosebleeds and denies any other sources of bleeding or bruising.  He tells me he is compliant with his medications to the best of his knowledge.  Outpatient Medications Prior to Visit  Medication Sig Dispense Refill  . blood glucose meter kit and supplies KIT Use to check blood sugar. DX E11.9 1 each 0  . clonazePAM (KLONOPIN) 1 MG tablet Take 1 tablet (1 mg total) by mouth 2 (two) times daily as needed for anxiety. 60 tablet 1  . losartan (COZAAR) 50 MG tablet Take 1.5 tablets (75 mg total) by mouth at bedtime. 45 tablet 3  . metoprolol succinate (TOPROL-XL) 100 MG 24 hr tablet TAKE 1 TABLET (100 MG TOTAL) BY MOUTH DAILY. TAKE WITH OR IMMEDIATELY FOLLOWING A MEAL. 90 tablet 3  . pantoprazole (PROTONIX) 40 MG tablet TAKE 1 TABLET BY MOUTH EVERY DAY 90 tablet 1  . potassium chloride SA (KLOR-CON M20) 20 MEQ tablet Take 2 tablets (40 mEq total) by mouth daily. 180 tablet 0  . PRESCRIPTION MEDICATION Inhale into the lungs See admin instructions. Bipap, pressure 16/12 with 2L of O2 - use whenever sleeping    . sertraline (ZOLOFT) 50 MG tablet Take 1 tablet  (50 mg total) by mouth daily. 30 tablet 1  . spironolactone (ALDACTONE) 25 MG tablet TAKE 1 TABLET BY MOUTH EVERY DAY 90 tablet 3  . torsemide (DEMADEX) 20 MG tablet Take 20-30 mg by mouth daily.    Marland Kitchen FARXIGA 10 MG TABS tablet TAKE 1 TABLET BY MOUTH EVERY DAY 90 tablet 1  . sildenafil (REVATIO) 20 MG tablet Take 4 tablets (80 mg total) by mouth daily as needed. 60 tablet 5  . XARELTO 20 MG TABS tablet TAKE 1 TABLET BY MOUTH EVERY DAY 30 tablet 5  . ARIPiprazole (ABILIFY) 5 MG tablet Take 1 tablet (5 mg total) by mouth daily. 30 tablet 1  . atorvastatin (LIPITOR) 40 MG tablet Take 1 tablet (40 mg total) by mouth daily. 90 tablet 1  . b complex vitamins tablet Take 1 tablet by mouth daily at 12 noon.    . Cholecalciferol (VITAMIN D3) 1.25 MG (50000 UT) CAPS TAKE 1 CAPSULE BY MOUTH ONE TIME PER WEEK 12 capsule 0  . icosapent Ethyl (VASCEPA) 1 g capsule Take 2 g by mouth daily.    . Insulin Degludec (TRESIBA FLEXTOUCH) 200 UNIT/ML SOPN Inject 26 Units into the skin daily. (Patient not taking: Reported on 01/11/2020) 9 mL 1  . Insulin Pen Needle 32G X 8 MM MISC Use as directed (Patient not taking: Reported on 01/11/2020) 100  each 0   No facility-administered medications prior to visit.    ROS Review of Systems  Constitutional: Positive for fatigue. Negative for diaphoresis and unexpected weight change.  Eyes: Negative.   Respiratory: Positive for apnea. Negative for chest tightness, shortness of breath and wheezing.   Cardiovascular: Negative for chest pain, palpitations and leg swelling.  Gastrointestinal: Negative for abdominal pain, constipation, diarrhea, nausea and vomiting.  Endocrine: Negative.   Genitourinary: Negative.  Negative for difficulty urinating.       + ED  Musculoskeletal: Negative for arthralgias and myalgias.  Skin: Negative.  Negative for color change and pallor.  Neurological: Negative.  Negative for dizziness, weakness, light-headedness and headaches.  Hematological:  Negative for adenopathy. Does not bruise/bleed easily.  Psychiatric/Behavioral: Positive for sleep disturbance. Negative for behavioral problems, confusion and dysphoric mood. The patient is not nervous/anxious.     Objective:  BP 120/80 (BP Location: Left Arm, Patient Position: Sitting, Cuff Size: Large)   Pulse 75   Temp 98 F (36.7 C) (Oral)   Ht _0  (1.753 m)   Wt (!) 388 lb (176 kg)   SpO2 96%   BMI 57.30 kg/m   BP Readings from Last 3 Encounters:  03/21/20 120/80  01/11/20 128/78  01/05/20 124/86    Wt Readings from Last 3 Encounters:  03/21/20 (!) 388 lb (176 kg)  01/11/20 (!) 390 lb (176.9 kg)  01/05/20 (!) 388 lb (176 kg)    Physical Exam Vitals reviewed.  Constitutional:      General: He is not in acute distress.    Appearance: He is obese. He is not toxic-appearing or diaphoretic.  HENT:     Nose: Nose normal.     Mouth/Throat:     Mouth: Mucous membranes are moist.  Eyes:     General: No scleral icterus.    Conjunctiva/sclera: Conjunctivae normal.  Cardiovascular:     Rate and Rhythm: Normal rate and regular rhythm.     Pulses: Normal pulses.     Heart sounds: No murmur.  Pulmonary:     Effort: Pulmonary effort is normal.     Breath sounds: No stridor. No wheezing, rhonchi or rales.  Abdominal:     General: Abdomen is protuberant. Bowel sounds are normal. There is no distension.     Palpations: Abdomen is soft. There is no hepatomegaly, splenomegaly or mass.     Tenderness: There is no abdominal tenderness.  Musculoskeletal:        General: Normal range of motion.     Cervical back: Neck supple.     Right lower leg: No edema.     Left lower leg: No edema.  Skin:    General: Skin is warm and dry.     Coloration: Skin is not pale.  Neurological:     General: No focal deficit present.     Mental Status: He is alert.  Psychiatric:        Mood and Affect: Mood normal.        Behavior: Behavior normal.        Thought Content: Thought content  normal.        Judgment: Judgment normal.     Lab Results  Component Value Date   WBC 9.2 03/21/2020   HGB 15.9 03/21/2020   HCT 46.4 03/21/2020   PLT 265.0 03/21/2020   GLUCOSE 123 (H) 03/21/2020   CHOL 185 03/21/2020   TRIG (H) 03/21/2020    446.0 Triglyceride is over 400; calculations on Lipids  are invalid.   HDL 28.00 (L) 03/21/2020   LDLDIRECT 114.0 03/21/2020   LDLCALC 128 (H) 12/07/2018   ALT 29 03/21/2020   AST 23 03/21/2020   NA 139 03/21/2020   K 3.6 03/21/2020   CL 106 03/21/2020   CREATININE 1.22 03/21/2020   BUN 16 03/21/2020   CO2 27 03/21/2020   TSH 1.27 03/21/2020   PSA 0.63 10/12/2019   INR 1.1 (H) 03/21/2020   HGBA1C 5.5 03/21/2020   MICROALBUR 0.9 01/19/2019    No results found.  Assessment & Plan:   [name] was seen today for diabetes.  Diagnoses and all orders for this visit:  Fatty liver disease, nonalcoholic- His liver enzymes have improved and he is making progress on his lifestyle modifications.  His MELD score is only 9 so I do not expect any complications from this over the next 6 months. -     Hepatic function panel; Future -     Protime-INR; Future -     Protime-INR -     Hepatic function panel  Colon cancer screening -     Cologuard  PE (pulmonary thromboembolism) ([place])- Will continue chronic anticoagulation with the DOAC. -     Discontinue: rivaroxaban (XARELTO) 20 MG TABS tablet; Take 1 tablet (20 mg total) by mouth daily. -     rivaroxaban (XARELTO) 20 MG TABS tablet; Take 1 tablet (20 mg total) by mouth daily.  AF (paroxysmal atrial fibrillation) ([place])- Will reduce the risk of thromboembolic events with the DOAC. -     Discontinue: rivaroxaban (XARELTO) 20 MG TABS tablet; Take 1 tablet (20 mg total) by mouth daily. -     rivaroxaban (XARELTO) 20 MG TABS tablet; Take 1 tablet (20 mg total) by mouth daily. -     CBC with Differential/Platelet; Future -     CBC with Differential/Platelet  Chronic combined systolic and  diastolic CHF (congestive heart failure) ([place])- He has a normal volume status.  Will continue the SGLT2 inhibitor and loop diuretic. -     dapagliflozin propanediol (FARXIGA) 10 MG TABS tablet; Take 10 mg by mouth daily. -     CBC with Differential/Platelet; Future -     CBC with Differential/Platelet  Type II diabetes mellitus with manifestations ([place])- His A1c is down to 5.5%.  I told him not to use the insulin anymore. -     dapagliflozin propanediol (FARXIGA) 10 MG TABS tablet; Take 10 mg by mouth daily. -     Basic metabolic panel; Future -     Hemoglobin A1c; Future -     Hemoglobin A1c -     Basic metabolic panel  Drug-induced erectile dysfunction -     sildenafil (REVATIO) 20 MG tablet; Take 4 tablets (80 mg total) by mouth daily as needed.  Hyperlipidemia with target LDL less than 100- He has achieved his LDL goal and is doing well on the statin. -     atorvastatin (LIPITOR) 40 MG tablet; Take 1 tablet (40 mg total) by mouth daily. -     TSH; Future -     Lipid panel; Future -     Lipid panel -     TSH  Hypertriglyceridemia- I have asked him to be more compliant with icosapent ethyl to reduce the risk of complications from hypertriglyceridemia and for cardiovascular risk reduction. -     Lipid panel; Future -     Lipid panel -     icosapent Ethyl (VASCEPA) 1 g capsule; Take  2 capsules (2 g total) by mouth 2 (two) times daily.  Other orders -     LDL cholesterol, direct   I have discontinued [name]'s b complex vitamins, Insulin Pen Needle, Xarelto, Tresiba FlexTouch, Vitamin D3, and ARIPiprazole. I have also changed his Farxiga and icosapent Ethyl. Additionally, I am having him maintain his blood glucose meter kit and supplies, PRESCRIPTION MEDICATION, metoprolol succinate, spironolactone, potassium chloride SA, torsemide, losartan, pantoprazole, clonazePAM, sertraline, rivaroxaban, sildenafil, and atorvastatin.  Meds ordered this encounter  Medications  .  DISCONTD: rivaroxaban (XARELTO) 20 MG TABS tablet    Sig: Take 1 tablet (20 mg total) by mouth daily.    Dispense:  30 tablet    Refill:  5  . dapagliflozin propanediol (FARXIGA) 10 MG TABS tablet    Sig: Take 10 mg by mouth daily.    Dispense:  90 tablet    Refill:  1  . rivaroxaban (XARELTO) 20 MG TABS tablet    Sig: Take 1 tablet (20 mg total) by mouth daily.    Dispense:  90 tablet    Refill:  1  . sildenafil (REVATIO) 20 MG tablet    Sig: Take 4 tablets (80 mg total) by mouth daily as needed.    Dispense:  60 tablet    Refill:  5  . atorvastatin (LIPITOR) 40 MG tablet    Sig: Take 1 tablet (40 mg total) by mouth daily.    Dispense:  90 tablet    Refill:  1  . icosapent Ethyl (VASCEPA) 1 g capsule    Sig: Take 2 capsules (2 g total) by mouth 2 (two) times daily.    Dispense:  360 capsule    Refill:  1   I spent 60 minutes in preparing to see the patient by review of recent labs, imaging and procedures, obtaining and reviewing separately obtained history, communicating with the patient and family or caregiver, ordering medications, tests or procedures, and documenting clinical information in the EHR including the differential Dx, treatment, and any further evaluation and other management of 1. Fatty liver disease, nonalcoholic 2. PE (pulmonary thromboembolism) ([facility]) 3. AF (paroxysmal atrial fibrillation) ([place]) 4. Chronic combined systolic and diastolic CHF (congestive heart failure) ([place]) 5. Type II diabetes mellitus with manifestations ([place]) 6. Drug-induced erectile dysfunction 7. Hyperlipidemia with target LDL less than 100 8. Hypertriglyceridemia    Follow-up: Return in about 6 months (around 09/20/2020).  Scarlette Calico, MD

## 2020-03-22 ENCOUNTER — Encounter: Payer: Self-pay | Admitting: Internal Medicine

## 2020-03-22 LAB — CBC WITH DIFFERENTIAL/PLATELET
Basophils Absolute: 0.1 10*3/uL (ref 0.0–0.1)
Basophils Relative: 0.8 % (ref 0.0–3.0)
Eosinophils Absolute: 0.1 10*3/uL (ref 0.0–0.7)
Eosinophils Relative: 1.5 % (ref 0.0–5.0)
HCT: 46.4 % (ref 39.0–52.0)
Hemoglobin: 15.9 g/dL (ref 13.0–17.0)
Lymphocytes Relative: 50.3 % — ABNORMAL HIGH (ref 12.0–46.0)
Lymphs Abs: 4.6 10*3/uL — ABNORMAL HIGH (ref 0.7–4.0)
MCHC: 34.3 g/dL (ref 30.0–36.0)
MCV: 86.8 fl (ref 78.0–100.0)
Monocytes Absolute: 0.5 10*3/uL (ref 0.1–1.0)
Monocytes Relative: 5.3 % (ref 3.0–12.0)
Neutro Abs: 3.9 10*3/uL (ref 1.4–7.7)
Neutrophils Relative %: 42.1 % — ABNORMAL LOW (ref 43.0–77.0)
Platelets: 265 10*3/uL (ref 150.0–400.0)
RBC: 5.35 Mil/uL (ref 4.22–5.81)
RDW: 15.6 % — ABNORMAL HIGH (ref 11.5–15.5)
WBC: 9.2 10*3/uL (ref 4.0–10.5)

## 2020-03-22 LAB — HEMOGLOBIN A1C: Hgb A1c MFr Bld: 5.5 % (ref 4.6–6.5)

## 2020-03-22 MED ORDER — ICOSAPENT ETHYL 1 G PO CAPS: 2.0000 g | ORAL_CAPSULE | Freq: Two times a day (BID) | ORAL | 1 refills | Status: DC

## 2020-03-23 DIAGNOSIS — G4733 Obstructive sleep apnea (adult) (pediatric): Secondary | ICD-10-CM | POA: Diagnosis not present

## 2020-04-02 ENCOUNTER — Other Ambulatory Visit: Payer: Self-pay

## 2020-04-02 ENCOUNTER — Encounter: Payer: Self-pay | Admitting: Psychiatry

## 2020-04-02 ENCOUNTER — Telehealth (INDEPENDENT_AMBULATORY_CARE_PROVIDER_SITE_OTHER): Payer: Medicare HMO | Admitting: Psychiatry

## 2020-04-02 DIAGNOSIS — R69 Illness, unspecified: Secondary | ICD-10-CM | POA: Diagnosis not present

## 2020-04-02 DIAGNOSIS — F410 Panic disorder [episodic paroxysmal anxiety] without agoraphobia: Secondary | ICD-10-CM

## 2020-04-02 DIAGNOSIS — F3175 Bipolar disorder, in partial remission, most recent episode depressed: Secondary | ICD-10-CM

## 2020-04-02 MED ORDER — VILAZODONE HCL 20 MG PO TABS: 20.0000 mg | ORAL_TABLET | Freq: Every day | ORAL | 1 refills | Status: DC

## 2020-04-02 MED ORDER — ARIPIPRAZOLE 5 MG PO TABS: 5.0000 mg | ORAL_TABLET | Freq: Every day | ORAL | 1 refills | Status: DC

## 2020-04-02 NOTE — Progress Notes (Signed)
BH MD/PA/NP OP Progress Note  I connected with  [name] on 04/02/20 by a video enabled telemedicine application and verified that I am speaking with the correct person using two identifiers.   I discussed the limitations of evaluation and management by telemedicine. The patient expressed understanding and agreed to proceed.    04/02/2020 10:44 AM [name]  MRN:  [id number]  Chief Complaint:  " I am feeling better and having less racing thoughts"  HPI: [name] was seen today via telehealth and he reported that he is doing well.  He was prescribed combination of Zoloft and Abilify at the time of his last visit a month ago.  He stated that he is feeling better and having less racing thoughts.  He reported that he is less anxious and denied depression.   Despite improvement in his mood and racing thoughts he complained of sexual side effects.  He reported that he could manage it however his significant other is really bothered by it and he does not want this to impact their relationship.  Patient was informed that it is likely that Zoloft is responsible for the sexual side effects due to its propensity.  Patient was given the option of trying something different.  Patient was agreeable to try a different option.  He was offered Viibryd.  Visit Diagnosis:    ICD-10-CM   1. Bipolar 1 disorder, depressed, partial remission ([place])  F31.75   2. Panic disorder  F41.0     Past Psychiatric History:   Past Medical History:  Past Medical History:  Diagnosis Date  . Alcohol abuse   . Anxiety state, unspecified   . Atrial fibrillation ([place])   . CHF (congestive heart failure) ([place])   . Chronic systolic heart failure ([place])   . Diabetes mellitus, type II ([place])   . Edema   . Gout   . History of medication noncompliance   . Migraine   . Obesity, unspecified   . Obstructive sleep apnea   . Psychiatric disorder   . Pulmonary embolism ([place])   . Shortness of breath     Past Surgical  History:  Procedure Laterality Date  . CARDIAC CATHETERIZATION    . CARDIAC CATHETERIZATION N/A 08/13/2015   Procedure: Right/Left Heart Cath and Coronary Angiography;  Surgeon: [name], MD;  Location: [facility];  Service: Cardiovascular;  Laterality: N/A;  . RIGHT/LEFT HEART CATH AND CORONARY ANGIOGRAPHY N/A 04/14/2017   Procedure: Right/Left Heart Cath and Coronary Angiography;  Surgeon: [name], MD;  Location: [facility];  Service: Cardiovascular;  Laterality: N/A;  . TESTICLE SURGERY      Family Psychiatric History: History of depression and bipolar in siblings  Family History:  Family History  Problem Relation Age of Onset  . Cancer Mother        brain tumor  . Hypertension Mother   . Diabetes Father        Deceased, 23  . Heart disease Maternal Grandmother   . Hypertension Other        Family History  . Stroke Other        Family History  . Diabetes Other        Family History  . Diabetes Daughter     Social History:  Social History   Socioeconomic History  . Marital status: Married    Spouse name: Not on file  . Number of children: 3  . Years of education: Not on  file  . Highest education level: Not on file  Occupational History  . Occupation: DISABLED  Tobacco Use  . Smoking status: Current Every Day Smoker    Packs/day: 0.50    Years: 30.00    Pack years: 15.00    Types: Cigarettes  . Smokeless tobacco: Never Used  Substance and Sexual Activity  . Alcohol use: Not Currently    Alcohol/week: 0.0 standard drinks  . Drug use: No  . Sexual activity: Not Currently  Other Topics Concern  . Not on file  Social History Narrative   He smokes about a pack per day and he has been smoking since he was 54 years of age.  He drinks alcohol occasionally, but he denies any illicit drug abuse.  He is presently on disability.    Lives with wife in a 2 story home.  Has 2 children.   Previously worked in [employer], last worked in 1998.    Highest level of education:  11th grade           Social Determinants of Health   Financial Resource Strain:   . Difficulty of Paying Living Expenses:   Food Insecurity:   . Worried About Charity fundraiser in the Last Year:   . Arboriculturist in the Last Year:   Transportation Needs:   . Film/video editor (Medical):   Marland Kitchen Lack of Transportation (Non-Medical):   Physical Activity:   . Days of Exercise per Week:   . Minutes of Exercise per Session:   Stress:   . Feeling of Stress :   Social Connections:   . Frequency of Communication with Friends and Family:   . Frequency of Social Gatherings with Friends and Family:   . Attends Religious Services:   . Active Member of Clubs or Organizations:   . Attends Archivist Meetings:   Marland Kitchen Marital Status:     Allergies:  Allergies  Allergen Reactions  . Ace Inhibitors Anaphylaxis and Swelling  . Buspirone Other (See Comments)    dizziness    Metabolic Disorder Labs: Lab Results  Component Value Date   HGBA1C 5.5 03/21/2020   MPG 275 04/12/2019   MPG 142.72 11/21/2018   No results found for: PROLACTIN Lab Results  Component Value Date   CHOL 185 03/21/2020   TRIG (H) 03/21/2020    446.0 Triglyceride is over 400; calculations on Lipids are invalid.   HDL 28.00 (L) 03/21/2020   CHOLHDL 7 03/21/2020   VLDL 51.8 (H) 10/12/2019   LDLCALC 128 (H) 12/07/2018   LDLCALC 89 01/15/2017   Lab Results  Component Value Date   TSH 1.27 03/21/2020   TSH 1.54 01/19/2019    Therapeutic Level Labs: No results found for: LITHIUM No results found for: VALPROATE No components found for:  CBMZ  Current Medications: Current Outpatient Medications  Medication Sig Dispense Refill  . atorvastatin (LIPITOR) 40 MG tablet Take 1 tablet (40 mg total) by mouth daily. 90 tablet 1  . blood glucose meter kit and supplies KIT Use to check blood sugar. DX E11.9 1 each 0  . clonazePAM (KLONOPIN) 1 MG tablet Take 1 tablet (1 mg  total) by mouth 2 (two) times daily as needed for anxiety. 60 tablet 1  . dapagliflozin propanediol (FARXIGA) 10 MG TABS tablet Take 10 mg by mouth daily. 90 tablet 1  . icosapent Ethyl (VASCEPA) 1 g capsule Take 2 capsules (2 g total) by mouth 2 (two) times daily. 360 capsule  1  . losartan (COZAAR) 50 MG tablet Take 1.5 tablets (75 mg total) by mouth at bedtime. 45 tablet 3  . metoprolol succinate (TOPROL-XL) 100 MG 24 hr tablet TAKE 1 TABLET (100 MG TOTAL) BY MOUTH DAILY. TAKE WITH OR IMMEDIATELY FOLLOWING A MEAL. 90 tablet 3  . pantoprazole (PROTONIX) 40 MG tablet TAKE 1 TABLET BY MOUTH EVERY DAY 90 tablet 1  . potassium chloride SA (KLOR-CON M20) 20 MEQ tablet Take 2 tablets (40 mEq total) by mouth daily. 180 tablet 0  . PRESCRIPTION MEDICATION Inhale into the lungs See admin instructions. Bipap, pressure 16/12 with 2L of O2 - use whenever sleeping    . rivaroxaban (XARELTO) 20 MG TABS tablet Take 1 tablet (20 mg total) by mouth daily. 90 tablet 1  . sertraline (ZOLOFT) 50 MG tablet Take 1 tablet (50 mg total) by mouth daily. 30 tablet 1  . sildenafil (REVATIO) 20 MG tablet Take 4 tablets (80 mg total) by mouth daily as needed. 60 tablet 5  . spironolactone (ALDACTONE) 25 MG tablet TAKE 1 TABLET BY MOUTH EVERY DAY 90 tablet 3  . torsemide (DEMADEX) 20 MG tablet Take 20-30 mg by mouth daily.     No current facility-administered medications for this visit.       Psychiatric Specialty Exam: Review of Systems  There were no vitals taken for this visit.There is no height or weight on file to calculate BMI.  General Appearance: Fairly Groomed  Eye Contact:  Good  Speech:  Clear and Coherent  Volume:  Normal  Mood:  Euthymic  Affect:  Congruent  Thought Process:  Coherent  Orientation:  Full (Time, Place, and Person)  Thought Content: WDL   Suicidal Thoughts:  No  Homicidal Thoughts:  No  Memory:  Immediate;   Good Recent;   Good  Judgement:  Good  Insight:  Good  Psychomotor  Activity:  Normal  Concentration:  Concentration: Good  Recall:  Good  Fund of Knowledge: Good  Language: Good  Akathisia:  No  Handed:  Right  AIMS (if indicated): not done  Assets:  Communication Skills Desire for Improvement Financial Resources/Insurance Housing Intimacy  ADL's:  Intact  Cognition: WNL  Sleep:  Good   Screenings: PHQ2-9     Office Visit from 12/21/2019 in [place] [facility] at [facility] Visit from 10/12/2019 in [facility] Office Visit from 12/16/2018 in [facility] from 04/29/2018 in [facility] from 02/02/2018 in [facility]  PHQ-2 Total Score  _0 0  0  PHQ-9 Total Score  _1 0  --       Assessment and Plan: Patient reported improvement in mood and anxiety after starting Zoloft and abilify.  However he reported sexual side effects.  He he requested to start another medication besides Zoloft.  He was offered Viibryd.Potential side effects of medication and risks vs benefits of treatment vs non-treatment were explained and discussed. All questions were answered.  1. Bipolar 1 disorder, depressed, partial remission ([place]) -Discontinue Zoloft due to sexual side effects. - Start Vilazodone HCl 20 MG TABS; Take 1 tablet (20 mg total) by mouth daily.  Dispense: 30 tablet; Refill: 1 - Continue ARIPiprazole (ABILIFY) 5 MG tablet; Take 1 tablet (5 mg total) by mouth daily.  Dispense: 30 tablet; Refill: 1  2. Panic disorder - Vilazodone HCl 20 MG TABS; Take  1 tablet (20 mg total) by mouth daily.  Dispense: 30 tablet; Refill: 1  Follow-up in 4 weeks.  [place] Crane, MD 04/02/2020, 10:44 AM

## 2020-04-05 ENCOUNTER — Other Ambulatory Visit (HOSPITAL_COMMUNITY): Payer: Self-pay | Admitting: Cardiology

## 2020-04-12 ENCOUNTER — Ambulatory Visit (HOSPITAL_COMMUNITY)
Admission: RE | Admit: 2020-04-12 | Discharge: 2020-04-12 | Disposition: A | Discharge status: Home / Self Care | Payer: Medicare HMO | Source: Ambulatory Visit | Attending: Cardiology | Admitting: Cardiology

## 2020-04-12 ENCOUNTER — Encounter (HOSPITAL_COMMUNITY): Payer: Self-pay | Admitting: Cardiology

## 2020-04-12 ENCOUNTER — Telehealth: Payer: Self-pay | Admitting: Internal Medicine

## 2020-04-12 ENCOUNTER — Other Ambulatory Visit: Payer: Self-pay

## 2020-04-12 VITALS — BP 93/58 | HR 65 | Wt 386.0 lb

## 2020-04-12 DIAGNOSIS — Z8249 Family history of ischemic heart disease and other diseases of the circulatory system: Secondary | ICD-10-CM | POA: Diagnosis not present

## 2020-04-12 DIAGNOSIS — F410 Panic disorder [episodic paroxysmal anxiety] without agoraphobia: Secondary | ICD-10-CM | POA: Diagnosis not present

## 2020-04-12 DIAGNOSIS — F17210 Nicotine dependence, cigarettes, uncomplicated: Secondary | ICD-10-CM | POA: Diagnosis not present

## 2020-04-12 DIAGNOSIS — I5042 Chronic combined systolic (congestive) and diastolic (congestive) heart failure: Secondary | ICD-10-CM | POA: Diagnosis not present

## 2020-04-12 DIAGNOSIS — Z7984 Long term (current) use of oral hypoglycemic drugs: Secondary | ICD-10-CM | POA: Diagnosis not present

## 2020-04-12 DIAGNOSIS — I110 Hypertensive heart disease with heart failure: Secondary | ICD-10-CM | POA: Insufficient documentation

## 2020-04-12 DIAGNOSIS — I480 Paroxysmal atrial fibrillation: Secondary | ICD-10-CM | POA: Insufficient documentation

## 2020-04-12 DIAGNOSIS — Z833 Family history of diabetes mellitus: Secondary | ICD-10-CM | POA: Diagnosis not present

## 2020-04-12 DIAGNOSIS — Z79899 Other long term (current) drug therapy: Secondary | ICD-10-CM | POA: Insufficient documentation

## 2020-04-12 DIAGNOSIS — Z888 Allergy status to other drugs, medicaments and biological substances status: Secondary | ICD-10-CM | POA: Diagnosis not present

## 2020-04-12 DIAGNOSIS — I428 Other cardiomyopathies: Secondary | ICD-10-CM | POA: Diagnosis not present

## 2020-04-12 DIAGNOSIS — G629 Polyneuropathy, unspecified: Secondary | ICD-10-CM | POA: Diagnosis not present

## 2020-04-12 DIAGNOSIS — E785 Hyperlipidemia, unspecified: Secondary | ICD-10-CM | POA: Diagnosis not present

## 2020-04-12 DIAGNOSIS — Z7901 Long term (current) use of anticoagulants: Secondary | ICD-10-CM | POA: Insufficient documentation

## 2020-04-12 DIAGNOSIS — Z6841 Body Mass Index (BMI) 40.0 and over, adult: Secondary | ICD-10-CM | POA: Insufficient documentation

## 2020-04-12 DIAGNOSIS — G4733 Obstructive sleep apnea (adult) (pediatric): Secondary | ICD-10-CM | POA: Diagnosis not present

## 2020-04-12 DIAGNOSIS — F329 Major depressive disorder, single episode, unspecified: Secondary | ICD-10-CM | POA: Insufficient documentation

## 2020-04-12 DIAGNOSIS — R69 Illness, unspecified: Secondary | ICD-10-CM | POA: Diagnosis not present

## 2020-04-12 LAB — BASIC METABOLIC PANEL
Anion gap: 8 (ref 5–15)
BUN: 15 mg/dL (ref 6–20)
CO2: 23 mmol/L (ref 22–32)
Calcium: 8.8 mg/dL — ABNORMAL LOW (ref 8.9–10.3)
Chloride: 109 mmol/L (ref 98–111)
Creatinine, Ser: 1.18 mg/dL (ref 0.61–1.24)
GFR calc Af Amer: >60 mL/min (ref >60)
GFR calc non Af Amer: >60 mL/min (ref >60)
Glucose, Bld: 132 mg/dL — ABNORMAL HIGH (ref 70–99)
Potassium: 3.9 mmol/L (ref 3.5–5.1)
Sodium: 140 mmol/L (ref 135–145)

## 2020-04-12 NOTE — Progress Notes (Signed)
Advanced Heart Failure Clinic Note   Primary Care:Dr. Scarlette Calico Primary Cardiologist: Dr. Aundra Dubin   HPI: [name] is a 54 y.o. male with a past medical history of NICM, EF 25-30% in March 2018, felt to be related to prior ETOH abuse. He also has a history of PE 03/2014 completed a years course of Xarelto, morbid obesity, OSA, and tobacco abuse.   He was admitted 11/12/15-11/14/15 with acute on chronic systolic CHF and palpitations. He wore a 30 day event monitor at discharge as he had frequent PVC's and questionable Afib on telemetry. Also with some NSVT, so he was started on Amiodaone, but at follow up had not started taking it. He had previously refused ICD and was seen inpatient by Dr. Rayann Heman who felt that his morbid obesity was a prohibitive factor.   He was seen in the clinic in April 2018. He had started drinking ETOH again. Volume status was stable, he is not an Entresto candidate due to angioedema with lisinopril. Weight was 411 pounds.   Admitted 04/12/17 with SOB, chest pain. D- dimer was 1.16, chest CT without central obstructing PE, however more peripheral and subsegmental pulmonary artery branches were not confidently evaluated due to his body habitus. He was started on a heparin gtt for presumed PE, however VQ scan showed no PE. Troponin was elevated, peaked at 3.37. LHC showed no CAD. Echo showed an EF of 15%, grade 2 DD, no pericarditis. He was diuresed with IV lasix, and started on torsemide 40m at discharge. Discharge weight was 405 pounds.   Admitted 5/22 through 04/24/2018 with abdominal pain, nausea, and vomiting. 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 Did not require surgical intervention. He will have follow up with GI.   Echo in 2/21 with EF 20-25%, severe LV dilation.   Today he returns for HF follow up. He stopped losartan because he thought it was causing nosebleeds.  He is not drinking ETOH.  Still smoking.  He is able to walk around a track without dyspnea.  He is short  of breath walking up a hill and is easily fatigued.  Weight is down 4 lbs.  He is now seeing a psychiatrist.  BP is running low today but he denies lightheadedness.   Labs (5/13): K 4.1, creatinine 1.05 Labs (1/14): K 3.8, creatinine 1.16, BNP 54 Labs (2/14): K 3.7, creatinine 1.11, BNP 28 Labs (2/16): K 4.1, creatinine 1.03, LDL 96, HCT 40 Labs (3/16): K 3.7, K 1.13, BNP 367 Labs (8/16): BNP 43, K 3.5, creatinine 1.01 Labs (08/13/15): K 3.7, creatinine 1.18, HCT 41.3 Labs (12/16): K 3.7, creatinine 1.14, TGs 495, digoxin < 0.2 Labs (2/17): K 3.8, creatinine 0.99, LDL 107, TGs 222 Labs (5/17): K 4, creatinine 1.47 Labs (2/18): K 3.9, creatinine 1.06, hgb 15.1, TGs 163, LDL 89, HDL 28, TSH normal  Labs (5/18): K 3.8, creatinine 1.12.  Labs (12/30/2017): K 3.8 Creatinine 1.25  Labs (01/28/2018): K 3.8 Creatinine 1.08 Labs 03/19/2018: K 4.1 Creatinine 1/15 BNP 212  Labs 04/29/3018: Creatinine 1.1  Labs (10/19): LDL 166, K 3.8, creatinine 1.01, AST 102 => 25, ALT 125 => 37, alkaline phosphatase 233 Labs (11/20): LDL 106, TGs 232 Labs (2/21): K 3.3, creatinine 1.3 Labs (4/21): LDL 114, TGs 466, K 3.6, creatinine 1.22  PMH: 1. Nonischemic cardiomyopathy: Prior cath with no significant CAD.  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  Prior echoes with EF as low as 25%.  Echo (9/13) with EF 35-40%, moderate to  severe LV dilation, diffuse hypokinesis, mild MR. Echo (5/15) with EF 30-35%, moderate to severe LAE, normal RV size and systolic function.  Angioedema with ACEI, headaches with hydralazine/nitrates. Echo (3/16) with EF 25-30%, severe LV dilation, normal RV size and systolic function.  [facility] 08/13/15 showed no significant coronary disease; RA mean 6, PA 33/11 mean 23, PCWP mean 13, Fick CO/CI 4.75 /1.68 (difficult study, radial artery spasm, if needs future cath would use groin).  Echo (9/16) showed EF 20-25%.   - Echo (3/18): EF 25-30%, moderate LAE - Echo  4/18 EF 15% - Echo 7/19 EF 20-25% - Echo 2/21 with EF 20-25%, severe LV dilation.  2. HTN: angioedema with ACEI.  3. OSA: on Bipap 4. Morbid obesity 5. Paroxysmal atrial fibrillation: Not documented recently.   6. Smoker.  7. Anxiety/panic attacks 8. PE: 5/15, diagnosed by V/Q scan. CTA chest 8/16 negative for PE.  9. NSVT, PVCs: 30 day monitor (12/16) with PVCs, PACs, no atrial fibrillation.  - Zio patch (9/19): few short NSVT runs, no atrial fibrillation, 1.1% PVCs 10. Hematuria: Apparently had negative workup by urology.  11. ABIs (6/16) were normal 12. 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  13. Gout 14. Low back pain.   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  Has son and daughter.    FH: No premature CAD.    Review of systems complete and found to be negative unless listed in HPI.    Current Outpatient Medications  Medication Sig Dispense Refill  . ARIPiprazole (ABILIFY) 5 MG tablet Take 1 tablet (5 mg total) by mouth daily. 30 tablet 1  . atorvastatin (LIPITOR) 40 MG tablet Take 1 tablet (40 mg total) by mouth daily. 90 tablet 1  . blood glucose meter kit and supplies KIT Use to check blood sugar. DX E11.9 1 each 0  . clonazePAM (KLONOPIN) 1 MG tablet Take 1 tablet (1 mg total) by mouth 2 (two) times daily as needed for anxiety. 60 tablet 1  . dapagliflozin propanediol (FARXIGA) 10 MG TABS tablet Take 10 mg by mouth daily. 90 tablet 1  . icosapent Ethyl (VASCEPA) 1 g capsule Take 2 capsules (2 g total) by mouth 2 (two) times daily. 360 capsule 1  . metoprolol succinate (TOPROL-XL) 100 MG 24 hr tablet TAKE 1 TABLET (100 MG TOTAL) BY MOUTH DAILY. TAKE WITH OR IMMEDIATELY FOLLOWING A MEAL. 90 tablet 3  . pantoprazole (PROTONIX) 40 MG tablet TAKE 1 TABLET BY MOUTH EVERY DAY 90 tablet 1  . potassium chloride SA (KLOR-CON) 20 MEQ tablet Take 40 mEq by mouth daily as needed.    Marland Kitchen PRESCRIPTION MEDICATION Inhale into the lungs  See admin instructions. Bipap, pressure 16/12 with 2L of O2 - use whenever sleeping    . rivaroxaban (XARELTO) 20 MG TABS tablet Take 1 tablet (20 mg total) by mouth daily. 90 tablet 1  . sildenafil (REVATIO) 20 MG tablet Take 4 tablets (80 mg total) by mouth daily as needed. 60 tablet 5  . spironolactone (ALDACTONE) 25 MG tablet TAKE 1 TABLET BY MOUTH EVERY DAY 90 tablet 3  . torsemide (DEMADEX) 20 MG tablet Take 20-30 mg by mouth daily.    . Vilazodone HCl 20 MG TABS Take 1 tablet (20 mg total) by mouth daily. 30 tablet 1   No current facility-administered medications for this encounter.    Allergies  Allergen Reactions  . Ace Inhibitors Anaphylaxis and Swelling  . Buspirone Other (  See Comments)    dizziness      Social History   Socioeconomic History  . Marital status: Married    Spouse name: Not on file  . Number of children: 3  . Years of education: Not on file  . Highest education level: Not on file  Occupational History  . Occupation: DISABLED  Tobacco Use  . Smoking status: Current Every Day Smoker    Packs/day: 0.50    Years: 30.00    Pack years: 15.00    Types: Cigarettes  . Smokeless tobacco: Never Used  Substance and Sexual Activity  . Alcohol use: Not Currently    Alcohol/week: 0.0 standard drinks  . Drug use: No  . Sexual activity: Not Currently  Other Topics Concern  . Not on file  Social History Narrative   He smokes about a pack per day and he has been smoking since he was 54 years of age.  He drinks alcohol occasionally, but he denies any illicit drug abuse.  He is presently on disability.    Lives with wife in a 2 story home.  Has 2 children.   Previously worked in [employer], last worked in 1998.   Highest level of education:  11th grade           Social Determinants of Health   Financial Resource Strain:   . Difficulty of Paying Living Expenses:   Food Insecurity:   . Worried About Charity fundraiser in the Last Year:   . Arboriculturist in  the Last Year:   Transportation Needs:   . Film/video editor (Medical):   Marland Kitchen Lack of Transportation (Non-Medical):   Physical Activity:   . Days of Exercise per Week:   . Minutes of Exercise per Session:   Stress:   . Feeling of Stress :   Social Connections:   . Frequency of Communication with Friends and Family:   . Frequency of Social Gatherings with Friends and Family:   . Attends Religious Services:   . Active Member of Clubs or Organizations:   . Attends Archivist Meetings:   Marland Kitchen Marital Status:   Intimate Partner Violence:   . Fear of Current or Ex-Partner:   . Emotionally Abused:   Marland Kitchen Physically Abused:   . Sexually Abused:       Family History  Problem Relation Age of Onset  . Cancer Mother        brain tumor  . Hypertension Mother   . Diabetes Father        Deceased, 47  . Heart disease Maternal Grandmother   . Hypertension Other        Family History  . Stroke Other        Family History  . Diabetes Other        Family History  . Diabetes Daughter     Vitals:   04/12/20 1159  BP: (!) 93/58  Pulse: 65  SpO2: 97%  Weight: (!) 175.1 kg (386 lb)   Filed Weights   04/12/20 1159  Weight: (!) 175.1 kg (386 lb)   Wt Readings from Last 3 Encounters:  04/12/20 (!) 175.1 kg (386 lb)  03/21/20 (!) 176 kg (388 lb)  01/11/20 (!) 176.9 kg (390 lb)    PHYSICAL EXAM: General: NAD, obese Neck: No JVD, no thyromegaly or thyroid nodule.  Lungs: Clear to auscultation bilaterally with normal respiratory effort. CV: Nondisplaced PMI.  Heart regular S1/S2, no S3/S4, no murmur.  No peripheral edema.  No carotid bruit.  Normal pedal pulses.  Abdomen: Soft, nontender, no hepatosplenomegaly, no distention.  Skin: Intact without lesions or rashes.  Neurologic: Alert and oriented x 3.  Psych: Normal affect. Extremities: No clubbing or cyanosis.  HEENT: Normal.   ASSESSMENT & PLAN: 1. Chronic systolic CHF: Nonischemic cardiomyopathy. ?If ETOH has played  a role (no longer drinking). Echo (3/18) with EF 25-30%. He was seen by EP and decided against ICD given marked obesity.  QRS not wide enough for CRT. Echo 7/19 and in 2/21 with EF 20-25%. NYHA class II symptoms. He does not look volume overloaded on exam.  - Continue torsemide 20 mg daily. BMET today.  - Continue spironolactone 25 mg daily - Continue Toprol XL 100 daily.  - Continue dapagliflozin 10 mg daily.  - He is off losartan due to nosebleeds.  I do not think that this medication caused the nosebleeds.  I will not start back today due to low BP.  - No entresto with h/o angioedema.  - Had side effects from digoxin, does not want to take. - Headaches with Bidil, cannot take.  2. Obesity: Continue to work on weight loss.  3. Smoking: Smoking 1 ppd. Encouraged cessation. No change. 4. OSA: Continue nightly BiPAP. 5. Paroxysmal atrial fibrillation: None documented recently.  He is on Xarelto.  6. PE: 04/14/2018, diagnosed by V/Q scan. He remains on Xarelto.  7. Anxiety/Panic attacks/depression: Followed by psychiatry.  8. Hyperlipidemia: Continue atorvastatin.  Triglycerides high, he has not been taking Vascepa.  - Would restart Vascepa 2 g bid.  Lipids 2 months.   Followup in 2 wks with HF pharmacist for medication titration.  If BP stable, would start losartan 25 mg daily.  See me in 3 months.    [name] 04/12/2020

## 2020-04-12 NOTE — Patient Instructions (Addendum)
TAKE Vascepa 2g  Twice a day  Labs today We will only contact you if something comes back abnormal or we need to make some changes. Otherwise no news is good news!  Your physician recommends that you schedule a follow-up appointment in: 4 weeks with the pharmacist   Pharmacy appointment: Tuesday June 15th, 2021 at [facility] code [phone]  Your physician recommends that you schedule a follow-up appointment in: 3 months with [name]  [name] Appointment: Friday August 13th, 2021 at 9:20am Garage code [id number]  Please call office at [phone] option [phone] if you have any questions or concerns.   At the [facility], you and your health needs are our priority. As part of our continuing mission to provide you with exceptional heart care, we have created designated Provider Care Teams. These Care Teams include your primary Cardiologist (physician) and Advanced Practice Providers (APPs- Physician Assistants and Nurse Practitioners) who all work together to provide you with the care you need, when you need it.   You may see any of the following providers on your designated Care Team at your next follow up: Marland Kitchen Dr Glori Bickers . Dr [name] . [name], NP . Lyda Jester, PA . Audry Riles, PharmD   Please be sure to bring in all your medications bottles to every appointment.

## 2020-04-12 NOTE — Progress Notes (Signed)
  Chronic Care Management   Outreach Note  04/12/2020 Name: PHELIX CALVANESE MRN: [id number] DOB: 01-Jul-1966  Referred by: [name], MD Reason for referral : No chief complaint on file.   An unsuccessful telephone outreach was attempted today. The patient was referred to the pharmacist for assistance with care management and care coordination.   This note is not being shared with the patient for the following reason: To respect privacy (The patient or proxy has requested that the information not be shared).  Follow Up Plan:   Earney [place] Upstream Scheduler

## 2020-05-01 ENCOUNTER — Telehealth (INDEPENDENT_AMBULATORY_CARE_PROVIDER_SITE_OTHER): Payer: Medicare HMO | Admitting: Psychiatry

## 2020-05-01 ENCOUNTER — Telehealth: Payer: Medicare HMO | Admitting: Psychiatry

## 2020-05-01 ENCOUNTER — Other Ambulatory Visit: Payer: Self-pay

## 2020-05-01 ENCOUNTER — Encounter (HOSPITAL_COMMUNITY): Payer: Self-pay | Admitting: Psychiatry

## 2020-05-01 DIAGNOSIS — F410 Panic disorder [episodic paroxysmal anxiety] without agoraphobia: Secondary | ICD-10-CM | POA: Diagnosis not present

## 2020-05-01 DIAGNOSIS — F3130 Bipolar disorder, current episode depressed, mild or moderate severity, unspecified: Secondary | ICD-10-CM | POA: Diagnosis not present

## 2020-05-01 DIAGNOSIS — F3175 Bipolar disorder, in partial remission, most recent episode depressed: Secondary | ICD-10-CM

## 2020-05-01 DIAGNOSIS — R69 Illness, unspecified: Secondary | ICD-10-CM | POA: Diagnosis not present

## 2020-05-01 MED ORDER — ARIPIPRAZOLE 5 MG PO TABS: 5.0000 mg | ORAL_TABLET | Freq: Every day | ORAL | 1 refills | Status: DC

## 2020-05-01 MED ORDER — CLONAZEPAM 1 MG PO TABS: 1.0000 mg | ORAL_TABLET | Freq: Two times a day (BID) | ORAL | 1 refills | Status: DC | PRN

## 2020-05-01 MED ORDER — VIIBRYD 40 MG PO TABS: 40.0000 mg | ORAL_TABLET | Freq: Every day | ORAL | 1 refills | Status: DC

## 2020-05-01 NOTE — Progress Notes (Addendum)
Adam Colon MD/PA/NP OP Progress Note Virtual Visit via Video Note  I connected with [name] on 05/01/20 at 10:00 AM EDT by a video enabled telemedicine application and verified that I am speaking with the correct person using two identifiers.  Location: Patient: Home Provider: Clinic   I discussed the limitations of evaluation and management by telemedicine and the availability of in person appointments. The patient expressed understanding and agreed to proceed.  I provided 19 minutes of non-face-to-face time during this encounter.    05/01/2020 10:12 AM [name]  MRN:  [id number]  Chief Complaint:  " My problem in that area has improved however my mood is all over the place."  HPI: Patient was seen a month ago after his initial assessment.  Patient had reported that he did notice significant provement in his mood and anxiety after starting the combination of Zoloft and Abilify.  However he had complained of sexual side effects namely delayed ejaculation.  Patient stated that it was not bothersome to him however his fiance was concerned about it and he did not want her to be unhappy.  Due to that reason he was switched to [place] from [employer]. Patient reported after he noticed that he had significant mood swings and almost crashing his mood after he stopped taking Zoloft.  He could tell that he was already missing it after he stopped taking it.  He informed that after he started taking Viibryd he did not find to be as effective as Zoloft in controlling his mood swings and also his anxiety and panic attacks.  He reported that he started to have more frequent panic attacks which are getting better with time.  He has been using clonazepam as needed to control anxiety. He informed that he is now scheduled to get married to his fiance on June 19.  He stated that he is feeling a bit anxious and developing cold feet about it.  He is excited but is also concerned as his ex who is mother of his  son is very unhappy about this. He informed that since started Viibryd he is no longer having any sexual side effects however he is not completely satisfied with his mood stabilization.  However he wants to give it more time.  He was agreeable to increasing the dose to 40 mg for optimal effect. He reported that sometimes he has a hard time staying asleep however he does not want to start any medication for that at this time. He asked what exactly the Abilify did and agreed that it was helping him.  Visit Diagnosis:    ICD-10-CM   1. Bipolar 1 disorder, depressed, partial remission ([place])  F31.75   2. Panic disorder  F41.0     Past Psychiatric History: Bipolar disorder, anxiety, panic attacks  Past Medical History:  Past Medical History:  Diagnosis Date  . Alcohol abuse   . Anxiety state, unspecified   . Atrial fibrillation ([place])   . CHF (congestive heart failure) ([place])   . Chronic systolic heart failure ([place])   . Diabetes mellitus, type II ([place])   . Edema   . Gout   . History of medication noncompliance   . Migraine   . Obesity, unspecified   . Obstructive sleep apnea   . Psychiatric disorder   . Pulmonary embolism ([place])   . Shortness of breath     Past Surgical History:  Procedure Laterality Date  . CARDIAC CATHETERIZATION    . CARDIAC CATHETERIZATION N/A  08/13/2015   Procedure: Right/Left Heart Cath and Coronary Angiography;  Surgeon: [name], MD;  Location: [facility];  Service: Cardiovascular;  Laterality: N/A;  . RIGHT/LEFT HEART CATH AND CORONARY ANGIOGRAPHY N/A 04/14/2017   Procedure: Right/Left Heart Cath and Coronary Angiography;  Surgeon: [name], MD;  Location: [name];  Service: Cardiovascular;  Laterality: N/A;  . TESTICLE SURGERY      Family Psychiatric History: History of depression and bipolar in siblings  Family History:  Family History  Problem Relation Age of Onset  . Cancer Mother        brain tumor  . Hypertension  Mother   . Diabetes Father        Deceased, 48  . Heart disease Maternal Grandmother   . Hypertension Other        Family History  . Stroke Other        Family History  . Diabetes Other        Family History  . Diabetes Daughter     Social History:  Social History   Socioeconomic History  . Marital status: Married    Spouse name: Not on file  . Number of children: 3  . Years of education: Not on file  . Highest education level: Not on file  Occupational History  . Occupation: DISABLED  Tobacco Use  . Smoking status: Current Every Day Smoker    Packs/day: 0.50    Years: 30.00    Pack years: 15.00    Types: Cigarettes  . Smokeless tobacco: Never Used  Substance and Sexual Activity  . Alcohol use: Not Currently    Alcohol/week: 0.0 standard drinks  . Drug use: No  . Sexual activity: Not Currently  Other Topics Concern  . Not on file  Social History Narrative   He smokes about a pack per day and he has been smoking since he was 54 years of age.  He drinks alcohol occasionally, but he denies any illicit drug abuse.  He is presently on disability.    Lives with wife in a 2 story home.  Has 2 children.   Previously worked in [employer], last worked in 1998.   Highest level of education:  11th grade           Social Determinants of Health   Financial Resource Strain:   . Difficulty of Paying Living Expenses:   Food Insecurity:   . Worried About Charity fundraiser in the Last Year:   . Arboriculturist in the Last Year:   Transportation Needs:   . Film/video editor (Medical):   Marland Kitchen Lack of Transportation (Non-Medical):   Physical Activity:   . Days of Exercise per Week:   . Minutes of Exercise per Session:   Stress:   . Feeling of Stress :   Social Connections:   . Frequency of Communication with Friends and Family:   . Frequency of Social Gatherings with Friends and Family:   . Attends Religious Services:   . Active Member of Clubs or Organizations:   .  Attends Archivist Meetings:   Marland Kitchen Marital Status:     Allergies:  Allergies  Allergen Reactions  . Ace Inhibitors Anaphylaxis and Swelling  . Buspirone Other (See Comments)    dizziness    Metabolic Disorder Labs: Lab Results  Component Value Date   HGBA1C 5.5 03/21/2020   MPG 275 04/12/2019   MPG 142.72 11/21/2018   No  results found for: PROLACTIN Lab Results  Component Value Date   CHOL 185 03/21/2020   TRIG (H) 03/21/2020    446.0 Triglyceride is over 400; calculations on Lipids are invalid.   HDL 28.00 (L) 03/21/2020   CHOLHDL 7 03/21/2020   VLDL 51.8 (H) 10/12/2019   LDLCALC 128 (H) 12/07/2018   LDLCALC 89 01/15/2017   Lab Results  Component Value Date   TSH 1.27 03/21/2020   TSH 1.54 01/19/2019    Therapeutic Level Labs: No results found for: LITHIUM No results found for: VALPROATE No components found for:  CBMZ  Current Medications: Current Outpatient Medications  Medication Sig Dispense Refill  . ARIPiprazole (ABILIFY) 5 MG tablet Take 1 tablet (5 mg total) by mouth daily. 30 tablet 1  . atorvastatin (LIPITOR) 40 MG tablet Take 1 tablet (40 mg total) by mouth daily. 90 tablet 1  . blood glucose meter kit and supplies KIT Use to check blood sugar. DX E11.9 1 each 0  . clonazePAM (KLONOPIN) 1 MG tablet Take 1 tablet (1 mg total) by mouth 2 (two) times daily as needed for anxiety. 60 tablet 1  . dapagliflozin propanediol (FARXIGA) 10 MG TABS tablet Take 10 mg by mouth daily. 90 tablet 1  . icosapent Ethyl (VASCEPA) 1 g capsule Take 2 capsules (2 g total) by mouth 2 (two) times daily. 360 capsule 1  . metoprolol succinate (TOPROL-XL) 100 MG 24 hr tablet TAKE 1 TABLET (100 MG TOTAL) BY MOUTH DAILY. TAKE WITH OR IMMEDIATELY FOLLOWING A MEAL. 90 tablet 3  . pantoprazole (PROTONIX) 40 MG tablet TAKE 1 TABLET BY MOUTH EVERY DAY 90 tablet 1  . potassium chloride SA (KLOR-CON) 20 MEQ tablet Take 40 mEq by mouth daily as needed.    Marland Kitchen PRESCRIPTION MEDICATION  Inhale into the lungs See admin instructions. Bipap, pressure 16/12 with 2L of O2 - use whenever sleeping    . rivaroxaban (XARELTO) 20 MG TABS tablet Take 1 tablet (20 mg total) by mouth daily. 90 tablet 1  . sildenafil (REVATIO) 20 MG tablet Take 4 tablets (80 mg total) by mouth daily as needed. 60 tablet 5  . spironolactone (ALDACTONE) 25 MG tablet TAKE 1 TABLET BY MOUTH EVERY DAY 90 tablet 3  . torsemide (DEMADEX) 20 MG tablet Take 20-30 mg by mouth daily.    . Vilazodone HCl 20 MG TABS Take 1 tablet (20 mg total) by mouth daily. 30 tablet 1   No current facility-administered medications for this visit.       Psychiatric Specialty Exam: Review of Systems  There were no vitals taken for this visit.There is no height or weight on file to calculate BMI.  General Appearance: Fairly Groomed  Eye Contact:  Good  Speech:  Clear and Coherent  Volume:  Normal  Mood:  Euthymic  Affect:  Congruent  Thought Process:  Coherent  Orientation:  Full (Time, Place, and Person)  Thought Content: WDL   Suicidal Thoughts:  No  Homicidal Thoughts:  No  Memory:  Immediate;   Good Recent;   Good  Judgement:  Good  Insight:  Good  Psychomotor Activity:  Normal  Concentration:  Concentration: Good  Recall:  Good  Fund of Knowledge: Good  Language: Good  Akathisia:  No  Handed:  Right  AIMS (if indicated): not done  Assets:  Communication Skills Desire for Improvement Financial Resources/Insurance Housing Intimacy  ADL's:  Intact  Cognition: WNL  Sleep:  Good   Screenings: PHQ2-9  Office Visit from 12/21/2019 in [place] [facility] Visit from 10/12/2019 in [place] Office Visit from 12/16/2018 in [facility] Visit from 04/29/2018 in [facility] from 02/02/2018 in [facility]  PHQ-2 Total Score  5  5  6   0  0  PHQ-9 Total Score  17  20  23   0   --       Assessment and Plan: Patient reported improvement in sexual side effects after starting Viibryd and discontinuing Zoloft.  However he does not feel Viibryd is as effective as Zoloft in controlling his mood swings.  He is agreeable to increasing the dose for optimal effects.  He is agreeable to continue Abilify 5 mg for now.  Is agreeable to continue clonazepam 1 mg twice daily as needed.  1. Bipolar 1 disorder, depressed, partial remission ([facility])  - Continue ARIPiprazole (ABILIFY) 5 MG tablet; Take 1 tablet (5 mg total) by mouth daily.  Dispense: 30 tablet; Refill: 1 - Increase Vilazodone HCl (VIIBRYD) 40 MG TABS; Take 1 tablet (40 mg total) by mouth daily.  Dispense: 30 tablet; Refill: 1  2. Panic disorder  - clonazePAM (KLONOPIN) 1 MG tablet; Take 1 tablet (1 mg total) by mouth 2 (two) times daily as needed for anxiety.  Dispense: 60 tablet; Refill: 1 - Vilazodone HCl (VIIBRYD) 40 MG TABS; Take 1 tablet (40 mg total) by mouth daily.  Dispense: 30 tablet; Refill: 1    Follow-up in 8 weeks.  [place] Crane, MD 05/01/2020, 10:12 AM

## 2020-05-07 ENCOUNTER — Telehealth (HOSPITAL_COMMUNITY): Payer: Self-pay | Admitting: Psychiatry

## 2020-05-07 MED ORDER — SERTRALINE HCL 50 MG PO TABS: 50.0000 mg | ORAL_TABLET | Freq: Every day | ORAL | 1 refills | Status: DC

## 2020-05-07 NOTE — Telephone Encounter (Signed)
Okay, recommend that he goes back to zoloft as viibryd at 40 mg dose causing side effects. Prescription for zoloft sent to his pharmacy. Can you call him to inform that I have sent the prescription for zoloft to his pharmacy. Thanks.

## 2020-05-07 NOTE — Telephone Encounter (Signed)
Lyric, myself and CMA Direce have already taken care of this encounter.

## 2020-05-07 NOTE — Telephone Encounter (Signed)
Spoke with patient  & informed per provider  : sertraline (ZOLOFT) 50 MG tablet   has been sent to [facility]

## 2020-05-14 ENCOUNTER — Telehealth: Payer: Self-pay | Admitting: Internal Medicine

## 2020-05-14 DIAGNOSIS — Z789 Other specified health status: Secondary | ICD-10-CM

## 2020-05-14 NOTE — Progress Notes (Signed)
  Chronic Care Management   Note  05/14/2020 Name: [name] MRN: [id number] DOB: 13-Sep-1966  [name] is a 54 y.o. year old male who is a primary care patient of [name], MD. I reached out to [name] by phone today in response to a referral sent by Mr. Geary Rufo Leone's PCP, [name], MD.   Mr. Fussell was given information about Chronic Care Management services today including:  1. CCM service includes personalized support from designated clinical staff supervised by his physician, including individualized plan of care and coordination with other care providers 2. 24/7 contact phone numbers for assistance for urgent and routine care needs. 3. Service will only be billed when office clinical staff spend 20 minutes or more in a month to coordinate care. 4. Only one practitioner may furnish and bill the service in a calendar month. 5. The patient may stop CCM services at any time (effective at the end of the month) by phone call to the office staff.   Patient agreed to services and verbal consent obtained.  This note is not being shared with the patient for the following reason: To respect privacy (The patient or proxy has requested that the information not be shared).  Follow up plan:   Earney [place] Upstream Scheduler

## 2020-05-15 ENCOUNTER — Ambulatory Visit (HOSPITAL_COMMUNITY)
Admission: RE | Admit: 2020-05-15 | Discharge: 2020-05-15 | Disposition: A | Discharge status: Home / Self Care | Payer: Medicare HMO | Source: Ambulatory Visit | Attending: Internal Medicine | Admitting: Internal Medicine

## 2020-05-15 ENCOUNTER — Other Ambulatory Visit: Payer: Self-pay

## 2020-05-15 VITALS — BP 110/80 | HR 69 | Wt 388.8 lb

## 2020-05-15 DIAGNOSIS — Z86711 Personal history of pulmonary embolism: Secondary | ICD-10-CM | POA: Diagnosis not present

## 2020-05-15 DIAGNOSIS — E785 Hyperlipidemia, unspecified: Secondary | ICD-10-CM | POA: Diagnosis not present

## 2020-05-15 DIAGNOSIS — G4733 Obstructive sleep apnea (adult) (pediatric): Secondary | ICD-10-CM | POA: Diagnosis not present

## 2020-05-15 DIAGNOSIS — I5042 Chronic combined systolic (congestive) and diastolic (congestive) heart failure: Secondary | ICD-10-CM | POA: Diagnosis present

## 2020-05-15 DIAGNOSIS — I480 Paroxysmal atrial fibrillation: Secondary | ICD-10-CM | POA: Diagnosis not present

## 2020-05-15 DIAGNOSIS — Z9989 Dependence on other enabling machines and devices: Secondary | ICD-10-CM | POA: Insufficient documentation

## 2020-05-15 DIAGNOSIS — F419 Anxiety disorder, unspecified: Secondary | ICD-10-CM | POA: Insufficient documentation

## 2020-05-15 DIAGNOSIS — F410 Panic disorder [episodic paroxysmal anxiety] without agoraphobia: Secondary | ICD-10-CM | POA: Diagnosis not present

## 2020-05-15 DIAGNOSIS — F329 Major depressive disorder, single episode, unspecified: Secondary | ICD-10-CM | POA: Diagnosis not present

## 2020-05-15 DIAGNOSIS — R69 Illness, unspecified: Secondary | ICD-10-CM | POA: Diagnosis not present

## 2020-05-15 DIAGNOSIS — I428 Other cardiomyopathies: Secondary | ICD-10-CM | POA: Insufficient documentation

## 2020-05-15 DIAGNOSIS — F17210 Nicotine dependence, cigarettes, uncomplicated: Secondary | ICD-10-CM | POA: Insufficient documentation

## 2020-05-15 DIAGNOSIS — Z79899 Other long term (current) drug therapy: Secondary | ICD-10-CM | POA: Diagnosis not present

## 2020-05-15 LAB — BASIC METABOLIC PANEL
Anion gap: 9 (ref 5–15)
BUN: 15 mg/dL (ref 6–20)
CO2: 24 mmol/L (ref 22–32)
Calcium: 8.9 mg/dL (ref 8.9–10.3)
Chloride: 105 mmol/L (ref 98–111)
Creatinine, Ser: 1.06 mg/dL (ref 0.61–1.24)
GFR calc Af Amer: >60 mL/min (ref >60)
GFR calc non Af Amer: >60 mL/min (ref >60)
Glucose, Bld: 79 mg/dL (ref 70–99)
Potassium: 3.7 mmol/L (ref 3.5–5.1)
Sodium: 138 mmol/L (ref 135–145)

## 2020-05-15 MED ORDER — VALSARTAN 40 MG PO TABS
20.0000 mg | ORAL_TABLET | Freq: Two times a day (BID) | ORAL | 11 refills | Status: DC
Start: 2020-05-15 — End: 2021-01-15

## 2020-05-15 MED ORDER — CHANTIX STARTING MONTH PAK 0.5 MG X 11 & 1 MG X 42 PO TABS: ORAL_TABLET | ORAL | 0 refills | Status: DC

## 2020-05-15 NOTE — Progress Notes (Signed)
PCP: Dr. Ronnald Ramp Primary Cardiologist: Dr. Radford Pax Heart Failure: Dr. Aundra Dubin  HPI:  Mr. Whisenant is a 54 y.o. male with a past medical history of NICM, EF 25-30% in March 2018, felt to be related to prior ETOH abuse. He also has a history of PE 03/2014 completed a years course of Xarelto, morbid obesity, OSA, and tobacco abuse.    He was admitted 11/12/15-11/14/15 with acute on chronic systolic CHF and palpitations. He wore a 30-day event monitor at discharge as he had frequent PVC's and questionable Afib on telemetry. Also with some NSVT, so he was started on Amiodarone, but at follow up had not started taking it. He had previously refused ICD and was seen inpatient by Dr. Rayann Heman who felt that his morbid obesity was a prohibitive factor.    He was seen in the clinic in April 2018. He had started drinking ETOH again. Volume status was stable, he is not an Entresto candidate due to angioedema with lisinopril. Weight was 411 pounds.    Admitted 04/12/17 with SOB, chest pain. D- dimer was 1.16, chest CT without central obstructing PE, however more peripheral and subsegmental pulmonary artery branches were not confidently evaluated due to his body habitus. He was started on a heparin gtt for presumed PE, however VQ scan showed no PE. Troponin was elevated, peaked at 3.37. LHC showed no CAD. Echo showed an EF of 83%, grade 2 diastolic dysfunction, no pericarditis. He was diuresed with IV furosemide and started on torsemide 20mg  at discharge. Discharge weight was 405 pounds.    Admitted 5/22 through 04/24/2018 with abdominal pain, nausea, and vomiting. Thought to have cholelithiasis. Did not require surgical intervention.    Echo in 2/21 with EF 20-25%, severe LV dilation.    Patient last seen by Dr. Aundra Dubin on 04/12/20 for HF follow up. Patient reported stopping losartan because he thought it was causing nosebleeds. Patient stated he stopped drinking ETOH but was still smoking. He reported being able to walk around a  track without dyspnea but does get short of breath walking up a hill and is easily fatigued. Weight was down 4 lbs. Patient also recently started seeing a psychiatrist. BP was low at office visit but patient denied dizziness or lightheadedness.  Today he returns to HF clinic for pharmacist medication titration. At last visit with MD, losartan could not be resumed due to low BP. He was instructed to restart taking his Vascepa. Today he feels tired, states he is sleeping a lot (uses BiPap machine). He believes it may be due to new start of psych medications such as sertraline and aripiprazole. He does report dizziness, lightheadedness, more fatigue than normal. He reports having panic attacks with SOB and chest pain but they seem to improve once he takes his medications. Patient checked his BP at home today and it was 122/79. He is able to complete ADLs independently. He does not experience shortness of breath at rest, but usually only when climbing stairs or moderate exertion. His swelling is at baseline and states good efficacy of his diuretic but does miss doses "every once in a while". JVD was negative on exam, only mild edema in extremities. Patient denies PND/orthopnea. Weight is stable around 386-388 lbs. His appetite has decreased. He does not eat as much as he used to and has lost weight since last year. Patient does eat some salty foods such as a small bag of potato chips, lightly salted peanuts, and crackers. He has stopped alcohol for over a year  but still continues to smoke. He desires to stop smoking. Discussed the negative effects of smoking on cardiovascular disease. He reports feeling worse when he smokes.  . Shortness of breath/dyspnea on exertion? yes with exertion . Orthopnea/PND? no . Edema? no . Lightheadedness/dizziness? yes . Daily weights at home? yes . Blood pressure/heart rate monitoring at home? yes . Following low-sodium/fluid-restricted diet? Sometimes  HF Medications: -  Metoprolol succinate 100 mg daily - Spironolactone 25 mg daily - Dapagliflozin 10 mg daily - Torsemide 20 mg daily - Potassium chloride 40 mEq daily PRN  Has the patient been experiencing any side effects to the medications prescribed?  Does report increased fatigue, lightheadedness, and sleepiness. His psych medications are new, which could be contributing.   Does the patient have any problems obtaining medications due to transportation or finances?   No issues. Patient has [employer]. He states he is able to afford the dapagliflozin and Vascepa without any issues.  Understanding of regimen: good Understanding of indications: good Potential of compliance: good - but does report missing doses of torsemide every once in a while Patient understands to avoid NSAIDs. Patient understands to avoid decongestants.    Pertinent Lab Values: . Serum creatinine 1.06, BUN 15, Potassium 3.7, Sodium 138   Vital Signs: . Weight: 388 lbs (last clinic weight: 386 lbs) . Blood pressure: 110/80  . Heart rate: 69  Assessment: Chronic systolic CHF: Nonischemic cardiomyopathy with question If ETOH has played a role (no longer drinking). Echo (3/18) with EF 25-30%. He was seen by EP and decided against ICD given marked obesity.  QRS not wide enough for CRT. Echo 7/19 and in 2/21 with EF 20-25%.  -NYHA class II symptoms. He does not look volume overloaded on exam.  - Continue torsemide 20 mg daily. BMET from today was stable.  - Continue metoprolol succinate 100 mg daily.  - Start valsartan 20 mg BID. Repeat BMET in 2 weeks. Explained that there is very low likelihood of this medication causing nosebleeds. BP of 110/80, should be able to tolerate low dose of valsartan. Unable to prescribe higher dose due to low BPs. - No Entresto with history of angioedema.  - Had side effects from digoxin, and patient does not want to take - Continue spironolactone 25 mg daily - Continue dapagliflozin 10 mg daily -  Patient not on BiDil due to headaches 2. Obesity: Continue to work on weight loss.  3. Smoking: Smoking 1 ppd. Encouraged cessation. Patient willing to quit.  -Start varenacline (Chantix) starter pack for smoking cessation.  4. OSA: Continue nightly BiPAP. 5. Paroxysmal atrial fibrillation: None documented recently.  He continues on Xarelto.  6. PE: 04/14/2018, diagnosed by V/Q scan. He remains on Xarelto.  7. Anxiety/Panic attacks/depression: Followed by psychiatry. Patient prescribed sertraline, clonazepam, and aripiprazole by psychiatry.  8. Hyperlipidemia:  -Continue atorvastatin.  - Continue Vascepa 2 g bid.  Lipid panel in 2 months (08. Patient instructed to fast prior to visit.   Plan: 1) Medication changes: Based on clinical presentation, vital signs and recent labs will start low dose of valsartan 20 mg BID. Start varenicline (chantix) for smoking cessation. 2) Labs: BMET today is stable. Obtain repeat BMET in 2 weeks after starting losartan. 3) Follow-up: visit with Dr. Aundra Dubin scheduled in 2 months  Sherren Kerns, PharmD PGY1 [facility] Resident   Audry Riles, PharmD, BCPS, [facility], CPP Heart Failure Clinic Pharmacist [phone]

## 2020-05-15 NOTE — Patient Instructions (Addendum)
It was a pleasure seeing you today!  MEDICATIONS: -We are changing your medications today -Start valsartan 20 mg (half-tablet) twice a day -We will contact you regarding smoking cessation medications once the prior authorization is complete -Call if you have questions about your medications.  LABS: -We will call you if your labs need attention.  NEXT APPOINTMENT: Return to clinic in 2 weeks to check labs Next visit with Dr. Aundra Dubin in 2 months  In general, to take care of your heart failure: -Limit your fluid intake to 2 Liters (half-gallon) per day.   -Limit your salt intake to ideally 2-3 grams (2000-3000 mg) per day. -Weigh yourself daily and record, and bring that "weight diary" to your next appointment.  (Weight gain of 2-3 pounds in 1 day typically means fluid weight.) -The medications for your heart are to help your heart and help you live longer.   -Please contact us before stopping any of your heart medications.  Call the clinic at [phone] with questions or to reschedule future appointments.

## 2020-05-29 ENCOUNTER — Other Ambulatory Visit: Payer: Self-pay | Admitting: Internal Medicine

## 2020-05-29 DIAGNOSIS — E118 Type 2 diabetes mellitus with unspecified complications: Secondary | ICD-10-CM

## 2020-05-29 DIAGNOSIS — I5042 Chronic combined systolic (congestive) and diastolic (congestive) heart failure: Secondary | ICD-10-CM

## 2020-05-30 ENCOUNTER — Telehealth (HOSPITAL_COMMUNITY): Payer: Self-pay | Admitting: *Deleted

## 2020-05-30 ENCOUNTER — Other Ambulatory Visit (HOSPITAL_COMMUNITY): Payer: Self-pay | Admitting: Cardiology

## 2020-05-30 ENCOUNTER — Other Ambulatory Visit (HOSPITAL_COMMUNITY): Payer: Self-pay

## 2020-05-30 ENCOUNTER — Other Ambulatory Visit (HOSPITAL_COMMUNITY): Payer: Medicare HMO

## 2020-05-30 DIAGNOSIS — I5042 Chronic combined systolic (congestive) and diastolic (congestive) heart failure: Secondary | ICD-10-CM

## 2020-05-30 NOTE — Progress Notes (Signed)
Orders Placed This Encounter  °Procedures  °• Basic Metabolic Panel (BMET)  °  Standing Status:   Future  °  Standing Expiration Date:   05/30/2021  °  Order Specific Question:   Release to patient  °  Answer:   Immediate  ° ° °

## 2020-05-30 NOTE — Telephone Encounter (Signed)
[facility] [place] [facility] day supply of Zoloft and Abilify. Called pharmacy, it is a patient preference to have a 90 day supply for his Rx's. He does have refills available for both but for 30 days. He has an appt end of July and enough medication with his 30 days refill and can discuss with Dr 90 day supply then.

## 2020-06-07 DIAGNOSIS — G4733 Obstructive sleep apnea (adult) (pediatric): Secondary | ICD-10-CM | POA: Diagnosis not present

## 2020-06-21 ENCOUNTER — Telehealth: Payer: Self-pay

## 2020-06-21 NOTE — Telephone Encounter (Deleted)
Error

## 2020-06-22 ENCOUNTER — Ambulatory Visit (INDEPENDENT_AMBULATORY_CARE_PROVIDER_SITE_OTHER): Payer: Medicare HMO | Admitting: Internal Medicine

## 2020-06-22 ENCOUNTER — Encounter: Payer: Self-pay | Admitting: Internal Medicine

## 2020-06-22 ENCOUNTER — Other Ambulatory Visit: Payer: Self-pay

## 2020-06-22 VITALS — BP 120/80 | HR 70 | Temp 99.0°F | Ht 69.0 in | Wt 394.0 lb

## 2020-06-22 DIAGNOSIS — I10 Essential (primary) hypertension: Secondary | ICD-10-CM | POA: Diagnosis not present

## 2020-06-22 DIAGNOSIS — E118 Type 2 diabetes mellitus with unspecified complications: Secondary | ICD-10-CM

## 2020-06-22 DIAGNOSIS — L0291 Cutaneous abscess, unspecified: Secondary | ICD-10-CM

## 2020-06-22 MED ORDER — SULFAMETHOXAZOLE-TRIMETHOPRIM 800-160 MG PO TABS
1.0000 | ORAL_TABLET | Freq: Two times a day (BID) | ORAL | 0 refills | Status: DC
Start: 2020-06-22 — End: 2020-11-19

## 2020-06-22 MED ORDER — MUPIROCIN CALCIUM 2 % NA OINT: 1.0000 "application " | TOPICAL_OINTMENT | Freq: Two times a day (BID) | NASAL | 0 refills | Status: DC

## 2020-06-22 NOTE — Patient Instructions (Signed)
Please take all new medication as prescribed - the pill and nasal antibiotics  Please continue all other medications as before, and refills have been done if requested.  Please have the pharmacy call with any other refills you may need.  Please keep your appointments with your specialists as you may have planned

## 2020-06-22 NOTE — Progress Notes (Signed)
Subjective:    Patient ID: [name], male    DOB: 28-Jan-1966, 54 y.o.   MRN: [id number]  HPI  Here with acute onset left pubic area red, tender swelling some raised and slight drainage last pm.  No high fever, chills and Pt denies chest pain, increased sob or doe, wheezing, orthopnea, PND, increased LE swelling, palpitations, dizziness or syncope.  seemed to start after shaving in the area.  Has happened twice now to that area, as well as hx of multiple abscess in past.  No specific hx of mrsa.  Pt denies new neurological symptoms such as new headache, or facial or extremity weakness or numbness   Pt denies polydipsia, polyuria  Past Medical History:  Diagnosis Date  . Alcohol abuse   . Anxiety state, unspecified   . Atrial fibrillation ([place])   . CHF (congestive heart failure) ([place])   . Chronic systolic heart failure ([place])   . Diabetes mellitus, type II ([place])   . Edema   . Gout   . History of medication noncompliance   . Migraine   . Obesity, unspecified   . Obstructive sleep apnea   . Psychiatric disorder   . Pulmonary embolism ([place])   . Shortness of breath    Past Surgical History:  Procedure Laterality Date  . CARDIAC CATHETERIZATION    . CARDIAC CATHETERIZATION N/A 08/13/2015   Procedure: Right/Left Heart Cath and Coronary Angiography;  Surgeon: [name], MD;  Location: [facility];  Service: Cardiovascular;  Laterality: N/A;  . RIGHT/LEFT HEART CATH AND CORONARY ANGIOGRAPHY N/A 04/14/2017   Procedure: Right/Left Heart Cath and Coronary Angiography;  Surgeon: [name], MD;  Location: [facility];  Service: Cardiovascular;  Laterality: N/A;  . TESTICLE SURGERY      reports that he has been smoking cigarettes. He has a 15.00 pack-year smoking history. He has never used smokeless tobacco. He reports previous alcohol use. He reports that he does not use drugs. family history includes Cancer in his mother; Diabetes in his daughter, father, and  another family member; Heart disease in his maternal grandmother; Hypertension in his mother and another family member; Stroke in an other family member. Allergies  Allergen Reactions  . Ace Inhibitors Anaphylaxis and Swelling  . Buspirone Other (See Comments)    dizziness   Review of Systems All otherwise neg per pt    Objective:   Physical Exam BP 120/80 (BP Location: Left Arm, Patient Position: Sitting, Cuff Size: Large)   Pulse 70   Temp 99 F (37.2 C) (Oral)   Ht 5\' 9"  (1.753 m)   Wt (!) 394 lb (178.7 kg)   SpO2 96%   BMI 58.18 kg/m  VS noted, super morbid obese Constitutional: Pt appears in NAD HENT: Head: NCAT.  Right Ear: External ear normal.  Left Ear: External ear normal.  Eyes: . Pupils are equal, round, and reactive to light. Conjunctivae and EOM are normal Nose: without d/c or deformity Neck: Neck supple. Gross normal ROM Cardiovascular: Normal rate and regular rhythm.   Pulmonary/Chest: Effort normal and breath sounds without rales or wheezing.  Abd:  Soft, NT, ND, + BS, no organomegaly Left pubic area with 1 cm area red, tender swelling and fluctuance non draining Neurological: Pt is alert. At baseline orientation, motor grossly intact Skin: Skin is warm. No rashes, other new lesions, no LE edema Psychiatric: Pt behavior is normal without agitation  All otherwise neg per pt Lab Results  Component Value Date   WBC 9.2 03/21/2020   HGB 15.9 03/21/2020   HCT 46.4 03/21/2020   PLT 265.0 03/21/2020   GLUCOSE 79 05/15/2020   CHOL 185 03/21/2020   TRIG (H) 03/21/2020    446.0 Triglyceride is over 400; calculations on Lipids are invalid.   HDL 28.00 (L) 03/21/2020   LDLDIRECT 114.0 03/21/2020   LDLCALC 128 (H) 12/07/2018   ALT 29 03/21/2020   AST 23 03/21/2020   NA 138 05/15/2020   K 3.7 05/15/2020   CL 105 05/15/2020   CREATININE 1.06 05/15/2020   BUN 15 05/15/2020   CO2 24 05/15/2020   TSH 1.27 03/21/2020   PSA 0.63 10/12/2019   INR 1.1 (H)  03/21/2020   HGBA1C 5.5 03/21/2020   MICROALBUR 0.9 01/19/2019         Assessment & Plan:

## 2020-06-23 ENCOUNTER — Encounter: Payer: Self-pay | Admitting: Internal Medicine

## 2020-06-23 NOTE — Assessment & Plan Note (Signed)
stable overall by history and exam, recent data reviewed with pt, and pt to continue medical treatment as before,  to f/u any worsening symptoms or concerns  

## 2020-06-23 NOTE — Assessment & Plan Note (Addendum)
Mild to mod, for oral antibx course,  Also empiric mupirocin asd  to f/u any worsening symptoms or concerns  I spent 31 minutes in preparing to see the patient by review of recent labs, imaging and procedures, obtaining and reviewing separately obtained history, communicating with the patient and family or caregiver, ordering medications, tests or procedures, and documenting clinical information in the EHR including the differential Dx, treatment, and any further evaluation and other management of abscess, dm, htn

## 2020-06-27 ENCOUNTER — Encounter (HOSPITAL_COMMUNITY): Payer: Self-pay | Admitting: Psychiatry

## 2020-06-27 ENCOUNTER — Other Ambulatory Visit: Payer: Self-pay

## 2020-06-27 ENCOUNTER — Telehealth (INDEPENDENT_AMBULATORY_CARE_PROVIDER_SITE_OTHER): Payer: Medicare HMO | Admitting: Psychiatry

## 2020-06-27 DIAGNOSIS — F410 Panic disorder [episodic paroxysmal anxiety] without agoraphobia: Secondary | ICD-10-CM

## 2020-06-27 DIAGNOSIS — F3175 Bipolar disorder, in partial remission, most recent episode depressed: Secondary | ICD-10-CM | POA: Diagnosis not present

## 2020-06-27 DIAGNOSIS — R69 Illness, unspecified: Secondary | ICD-10-CM | POA: Diagnosis not present

## 2020-06-27 MED ORDER — ARIPIPRAZOLE 5 MG PO TABS: 5.0000 mg | ORAL_TABLET | Freq: Every day | ORAL | 1 refills | Status: DC

## 2020-06-27 MED ORDER — SERTRALINE HCL 100 MG PO TABS: 100.0000 mg | ORAL_TABLET | Freq: Every day | ORAL | 1 refills | Status: DC

## 2020-06-27 MED ORDER — CLONAZEPAM 1 MG PO TABS: 1.0000 mg | ORAL_TABLET | Freq: Two times a day (BID) | ORAL | 1 refills | Status: DC | PRN

## 2020-06-27 NOTE — Progress Notes (Signed)
[facility] MD/PA/NP OP Progress Note  Virtual Visit via Video Note  I connected with [name] on 06/27/20 at  8:30 AM EDT by a video enabled telemedicine application and verified that I am speaking with the correct person using two identifiers.  Location: Patient: Home Provider: Clinic   I discussed the limitations of evaluation and management by telemedicine and the availability of in person appointments. The patient expressed understanding and agreed to proceed.  I provided 18 minutes of non-face-to-face time during this encounter.    06/27/2020 8:27 AM [name]  MRN:  [id number]  Chief Complaint:  " I am not feeling as good as I was."  HPI: Patient called a few days after his last visit and had reported that he did not feel the increased dose of Viibryd was helping and he had been switched back to Zoloft 50 mg at original dose.  Today, Patient reported that he is not feeling as good as he was a few days ago.  He informed that he finally did get married on June 19 however his wife is still living in [place] and he is still here locally.  He stated that his 34 year old son is still living with him.  He stated that he feels like he does not drive to do anything lately and he feels depressed.  He is not happy about their living arrangements and he is hoping that they can work through this and be united at a common place.  He stated that about a month ago he stopped smoking which was a very hard tablet to take off.  He stated that he has no energy during the daytime and he prefers to stay in bed and then stays up most of the night.  He stated that he has been planning to go for grocery shopping for the past 3 days however does not have the energy to do so. He denies any suicidal ideations. Patient was agreeable to increasing the dose of Zoloft 200 mg for optimal effect.  He was receptive to advised by the Probation officer about having a conversation with his wife regarding their living  arrangement so that they can be happy in the relationship together.   Visit Diagnosis:    ICD-10-CM   1. Bipolar 1 disorder, depressed, partial remission ([facility])  F31.75   2. Panic disorder  F41.0     Past Psychiatric History: Bipolar disorder, anxiety, panic attacks  Past Medical History:  Past Medical History:  Diagnosis Date  . Alcohol abuse   . Anxiety state, unspecified   . Atrial fibrillation ([facility])   . CHF (congestive heart failure) ([facility])   . Chronic systolic heart failure ([facility])   . Diabetes mellitus, type II ([place] [facility])   . Edema   . Gout   . History of medication noncompliance   . Migraine   . Obesity, unspecified   . Obstructive sleep apnea   . Psychiatric disorder   . Pulmonary embolism ([facility])   . Shortness of breath     Past Surgical History:  Procedure Laterality Date  . CARDIAC CATHETERIZATION    . CARDIAC CATHETERIZATION N/A 08/13/2015   Procedure: Right/Left Heart Cath and Coronary Angiography;  Surgeon: [name], MD;  Location: [facility];  Service: Cardiovascular;  Laterality: N/A;  . RIGHT/LEFT HEART CATH AND CORONARY ANGIOGRAPHY N/A 04/14/2017   Procedure: Right/Left Heart Cath and Coronary Angiography;  Surgeon: [name], MD;  Location: [facility];  Service: Cardiovascular;  Laterality: N/A;  . TESTICLE SURGERY      Family Psychiatric History: History of depression and bipolar in siblings  Family History:  Family History  Problem Relation Age of Onset  . Cancer Mother        brain tumor  . Hypertension Mother   . Diabetes Father        Deceased, 48  . Heart disease Maternal Grandmother   . Hypertension Other        Family History  . Stroke Other        Family History  . Diabetes Other        Family History  . Diabetes Daughter     Social History:  Social History   Socioeconomic History  . Marital status: Married    Spouse name: Not on file  . Number of children: 3  . Years of education: Not on file  . Highest  education level: Not on file  Occupational History  . Occupation: DISABLED  Tobacco Use  . Smoking status: Current Every Day Smoker    Packs/day: 0.50    Years: 30.00    Pack years: 15.00    Types: Cigarettes  . Smokeless tobacco: Never Used  Vaping Use  . Vaping Use: Never used  Substance and Sexual Activity  . Alcohol use: Not Currently    Alcohol/week: 0.0 standard drinks  . Drug use: No  . Sexual activity: Not Currently  Other Topics Concern  . Not on file  Social History Narrative   He smokes about a pack per day and he has been smoking since he was 54 years of age.  He drinks alcohol occasionally, but he denies any illicit drug abuse.  He is presently on disability.    Lives with wife in a 2 story home.  Has 2 children.   Previously worked in [employer], last worked in 1998.   Highest level of education:  11th grade           Social Determinants of Health   Financial Resource Strain:   . Difficulty of Paying Living Expenses:   Food Insecurity:   . Worried About Charity fundraiser in the Last Year:   . Arboriculturist in the Last Year:   Transportation Needs:   . Film/video editor (Medical):   Marland Kitchen Lack of Transportation (Non-Medical):   Physical Activity:   . Days of Exercise per Week:   . Minutes of Exercise per Session:   Stress:   . Feeling of Stress :   Social Connections:   . Frequency of Communication with Friends and Family:   . Frequency of Social Gatherings with Friends and Family:   . Attends Religious Services:   . Active Member of Clubs or Organizations:   . Attends Archivist Meetings:   Marland Kitchen Marital Status:     Allergies:  Allergies  Allergen Reactions  . Ace Inhibitors Anaphylaxis and Swelling  . Buspirone Other (See Comments)    dizziness    Metabolic Disorder Labs: Lab Results  Component Value Date   HGBA1C 5.5 03/21/2020   MPG 275 04/12/2019   MPG 142.72 11/21/2018   No results found for: PROLACTIN Lab Results   Component Value Date   CHOL 185 03/21/2020   TRIG (H) 03/21/2020    446.0 Triglyceride is over 400; calculations on Lipids are invalid.   HDL 28.00 (L) 03/21/2020   CHOLHDL 7 03/21/2020   VLDL 51.8 (H) 10/12/2019   LDLCALC 128 (  H) 12/07/2018   LDLCALC 89 01/15/2017   Lab Results  Component Value Date   TSH 1.27 03/21/2020   TSH 1.54 01/19/2019    Therapeutic Level Labs: No results found for: LITHIUM No results found for: VALPROATE No components found for:  CBMZ  Current Medications: Current Outpatient Medications  Medication Sig Dispense Refill  . ARIPiprazole (ABILIFY) 5 MG tablet Take 1 tablet (5 mg total) by mouth daily. 30 tablet 1  . atorvastatin (LIPITOR) 40 MG tablet Take 1 tablet (40 mg total) by mouth daily. 90 tablet 1  . blood glucose meter kit and supplies KIT Use to check blood sugar. DX E11.9 1 each 0  . clonazePAM (KLONOPIN) 1 MG tablet Take 1 tablet (1 mg total) by mouth 2 (two) times daily as needed for anxiety. 60 tablet 1  . FARXIGA 10 MG TABS tablet TAKE 1 TABLET BY MOUTH EVERY DAY 90 tablet 1  . icosapent Ethyl (VASCEPA) 1 g capsule Take 2 capsules (2 g total) by mouth 2 (two) times daily. 360 capsule 1  . metoprolol succinate (TOPROL-XL) 100 MG 24 hr tablet TAKE 1 TABLET (100 MG TOTAL) BY MOUTH DAILY. TAKE WITH OR IMMEDIATELY FOLLOWING A MEAL. 90 tablet 3  . mupirocin nasal ointment (BACTROBAN) 2 % Place 1 application into the nose 2 (two) times daily. Use one-half of tube in each nostril twice daily for five (5) days. After application, press sides of nose together and gently massage. 10 g 0  . pantoprazole (PROTONIX) 40 MG tablet TAKE 1 TABLET BY MOUTH EVERY DAY 90 tablet 1  . potassium chloride SA (KLOR-CON) 20 MEQ tablet Take 40 mEq by mouth daily as needed.    Marland Kitchen PRESCRIPTION MEDICATION Inhale into the lungs See admin instructions. Bipap, pressure 16/12 with 2L of O2 - use whenever sleeping    . rivaroxaban (XARELTO) 20 MG TABS tablet Take 1 tablet (20  mg total) by mouth daily. 90 tablet 1  . sildenafil (REVATIO) 20 MG tablet Take 4 tablets (80 mg total) by mouth daily as needed. 60 tablet 5  . spironolactone (ALDACTONE) 25 MG tablet TAKE 1 TABLET BY MOUTH EVERY DAY 90 tablet 3  . sulfamethoxazole-trimethoprim (BACTRIM DS) 800-160 MG tablet Take 1 tablet by mouth 2 (two) times daily. 20 tablet 0  . torsemide (DEMADEX) 20 MG tablet Take 20-30 mg by mouth daily.    . valsartan (DIOVAN) 40 MG tablet Take 0.5 tablets (20 mg total) by mouth 2 (two) times daily. 30 tablet 11  . varenicline (CHANTIX STARTING MONTH PAK) 0.5 MG X 11 & 1 MG X 42 tablet Take one 0.5 mg tablet by mouth once daily for 3 days, then increase to one 0.5 mg tablet twice daily for 4 days, then increase to one 1 mg tablet twice daily. 53 tablet 0  . VIIBRYD 20 MG TABS Take 1 tablet by mouth daily.     No current facility-administered medications for this visit.       Psychiatric Specialty Exam: Review of Systems  There were no vitals taken for this visit.There is no height or weight on file to calculate BMI.  General Appearance: Fairly Groomed  Eye Contact:  Good  Speech:  Clear and Coherent  Volume:  Normal  Mood:  Euthymic  Affect:  Congruent  Thought Process:  Coherent  Orientation:  Full (Time, Place, and Person)  Thought Content: WDL   Suicidal Thoughts:  No  Homicidal Thoughts:  No  Memory:  Immediate;   Good  Recent;   Good  Judgement:  Good  Insight:  Good  Psychomotor Activity:  Normal  Concentration:  Concentration: Good  Recall:  Good  Fund of Knowledge: Good  Language: Good  Akathisia:  No  Handed:  Right  AIMS (if indicated): not done  Assets:  Communication Skills Desire for Improvement Financial Resources/Insurance Housing Intimacy  ADL's:  Intact  Cognition: WNL  Sleep:  Good   Screenings: PHQ2-9     Office Visit from 12/21/2019 in [facility] at [facility] Visit from 10/12/2019 in [facility] Visit from 12/16/2018 in [facility] Visit from 04/29/2018 in [facility] Visit from 02/02/2018 in [facility]  PHQ-2 Total Score _0 0 0  PHQ-9 Total Score _1 0 --       Assessment and Plan: Patient reported ongoing depressive symptoms due to some conflict between him and his wife.  He is hoping that they can be reunited and live together.  He was agreeable to increasing dose of sertraline 200 mg for optimal effect.  1. Bipolar 1 disorder, depressed, partial remission ([facility])  - Continue ARIPiprazole (ABILIFY) 5 MG tablet; Take 1 tablet (5 mg total) by mouth daily.  Dispense: 30 tablet; Refill: 1 - Increase sertraline 100 mg daily  2. Panic disorder  - clonazePAM (KLONOPIN) 1 MG tablet; Take 1 tablet (1 mg total) by mouth 2 (two) times daily as needed for anxiety.  Dispense: 60 tablet; Refill: 1 -Increase sertraline 100 mg daily    Follow-up in 8 weeks.  [place] Crane, MD 06/27/2020, 8:27 AM

## 2020-07-08 DIAGNOSIS — G4733 Obstructive sleep apnea (adult) (pediatric): Secondary | ICD-10-CM | POA: Diagnosis not present

## 2020-07-13 ENCOUNTER — Encounter (HOSPITAL_COMMUNITY): Payer: Medicare HMO | Admitting: Cardiology

## 2020-07-26 NOTE — Chronic Care Management (AMB) (Signed)
Chronic Care Management Pharmacy  Name: [name]  MRN: [id number] DOB: 12/25/1965   Chief Complaint/ HPI  [name],  54 y.o. , male presents for their Initial CCM visit with the clinical pharmacist via telephone due to COVID-19 Pandemic.  PCP : [name], MD Patient Care Team: [name], MD as PCP - General (Internal Medicine) [name], MD as PCP - Cardiology (Cardiology) [name], MD as PCP - Advanced Heart Failure (Cardiology) [name], MD as Consulting Physician (Cardiology) [name], [facility] as Pharmacist (Pharmacist)  Their chronic conditions include: Hypertension, Hyperlipidemia, Diabetes, Atrial Fibrillation, Heart Failure, GERD, Depression, Anxiety, Osteoarthritis, Tobacco use and BPH, Bipolar I disorder, NASH, OSA, Hx PE, NICM  Pt recently married his wife in June and moved to [place] to live together. He has trouble sleeping at night with BiPAP, endorses frequent awakenings and sleeping upright in armchair for a few hours each night. He has trouble with motivation and energy - he wants to be able to exercise but believes medication side effects are preventing him from doing what he wants.  Office Visits: 06/22/20 [name] OV: acute visit for pubic abscess - rx Bactrim and mupirocin  03/21/20 [name] OV: chronic f/u. Stop insulin d/t A1c 5.5. High Trig- Compliance counseling for Vascepa, avoid alcohol, decrease fat/sugar intake.  Consult Visit: 06/27/20 [name] (psych): increased sertraline to 100 mg daily, plan to titrate to 200 mg for optimal effect. Viibyrd back down to 20 mg.  05/15/20 [name] pharmacy appt: start valsartan 20 mg BID and Chantix.  05/01/20 [name] (psych): increase Viibryd to 40 mg  04/12/20 [name] (HF clinic): pt stopped losartan b/c he thought it was causing nosebleeds. No Entresto d/t hx angioedema. Restart Vascepa  03/05/20 [name] (psych): dx Bipolar 1 and panic disorder. Continue  clonazepam, start sertraline and Abilify for mood sx.  Allergies  Allergen Reactions  . Ace Inhibitors Anaphylaxis and Swelling    Angioedema  . Bidil [Isosorb Dinitrate-Hydralazine] Other (See Comments)    headache  . Digoxin And Related     Unspecified "side effects"  . Buspirone Other (See Comments)    dizziness   Medications: Outpatient Encounter Medications as of 07/27/2020  Medication Sig  . ARIPiprazole (ABILIFY) 5 MG tablet Take 1 tablet (5 mg total) by mouth daily.  . blood glucose meter kit and supplies KIT Use to check blood sugar. DX E11.9  . clonazePAM (KLONOPIN) 1 MG tablet Take 1 tablet (1 mg total) by mouth 2 (two) times daily as needed for anxiety.  Marland Kitchen FARXIGA 10 MG TABS tablet TAKE 1 TABLET BY MOUTH EVERY DAY  . icosapent Ethyl (VASCEPA) 1 g capsule Take 2 capsules (2 g total) by mouth 2 (two) times daily.  . metoprolol succinate (TOPROL-XL) 100 MG 24 hr tablet TAKE 1 TABLET (100 MG TOTAL) BY MOUTH DAILY. TAKE WITH OR IMMEDIATELY FOLLOWING A MEAL.  . mupirocin nasal ointment (BACTROBAN) 2 % Place 1 application into the nose 2 (two) times daily. Use one-half of tube in each nostril twice daily for five (5) days. After application, press sides of nose together and gently massage.  . pantoprazole (PROTONIX) 40 MG tablet TAKE 1 TABLET BY MOUTH EVERY DAY  . potassium chloride SA (KLOR-CON) 20 MEQ tablet Take 40 mEq by mouth daily as needed.  Marland Kitchen PRESCRIPTION MEDICATION Inhale into the lungs See admin instructions. Bipap, pressure 16/12 with 2L of O2 - use whenever sleeping  .  rivaroxaban (XARELTO) 20 MG TABS tablet Take 1 tablet (20 mg total) by mouth daily.  . sertraline (ZOLOFT) 100 MG tablet Take 1 tablet (100 mg total) by mouth daily.  . sildenafil (REVATIO) 20 MG tablet Take 4 tablets (80 mg total) by mouth daily as needed.  Marland Kitchen spironolactone (ALDACTONE) 25 MG tablet TAKE 1 TABLET BY MOUTH EVERY DAY  . sulfamethoxazole-trimethoprim (BACTRIM DS) 800-160 MG tablet Take 1  tablet by mouth 2 (two) times daily.  Marland Kitchen torsemide (DEMADEX) 20 MG tablet Take 20-30 mg by mouth daily.  . valsartan (DIOVAN) 40 MG tablet Take 0.5 tablets (20 mg total) by mouth 2 (two) times daily.  Marland Kitchen atorvastatin (LIPITOR) 40 MG tablet Take 1 tablet (40 mg total) by mouth daily. (Patient not taking: Reported on 07/27/2020)  . varenicline (CHANTIX STARTING MONTH PAK) 0.5 MG X 11 & 1 MG X 42 tablet Take one 0.5 mg tablet by mouth once daily for 3 days, then increase to one 0.5 mg tablet twice daily for 4 days, then increase to one 1 mg tablet twice daily. (Patient not taking: Reported on 07/27/2020)  . VIIBRYD 20 MG TABS Take 1 tablet by mouth daily. (Patient not taking: Reported on 07/27/2020)  . [DISCONTINUED] dapagliflozin propanediol (FARXIGA) 10 MG TABS tablet Take 10 mg by mouth daily.  . [DISCONTINUED] pravastatin (PRAVACHOL) 40 MG tablet Take 40 mg by mouth daily.   No facility-administered encounter medications on file as of 07/27/2020.     Current Diagnosis/Assessment:  SDOH Interventions     Most Recent Value  SDOH Interventions  Financial Strain Interventions Intervention Not Indicated      Goals Addressed            This Visit's Progress   . Pharmacy Care Plan       CARE PLAN ENTRY (see longitudinal plan of care for additional care plan information)  Current Barriers:  . Chronic Disease Management support, education, and care coordination needs related to Hypertension, Hyperlipidemia, Diabetes, Atrial Fibrillation, and Heart Failure   Hypertension / Heart Failure BP Readings from Last 3 Encounters:  06/22/20 120/80  05/15/20 110/80  04/12/20 (!) 93/58 .  Pharmacist Clinical Goal(s): o Over the next 30 days, patient will work with PharmD and providers to maintain BP goal <130/80 . Current regimen:  o Metoprolol succinate 100 mg daily o Spironolactone 25 mg daily o Torsemide 20-30 mg daily as needed o Valsartan 40 mg - 1/2 tab twice a day (not started) o Farxiga  10 mg daily o Potassium chloride 20 mEq - 2 tab daily as needed . Interventions: o Discussed benefits of beta blockers, valsartan, spironolactone, and Farxiga for reducing risk for heart failure hospitalizations and prolonging life o Discussed side effects of metoprolol and potential benefits to moving dose to evening o Recommend daily use of torsemide to maintain fluid status o Recommend discussing optimal metoprolol dose with cardiologist given side effects of fatigue . Patient self care activities - Over the next 30 days, patient will: o Check BP daily, document, and provide at future appointments o Ensure daily salt intake < 2300 mg/day o Move metoprolol to evening o Reschedule appt with [name] to discuss medications/side effects  Hyperlipidemia Lab Results  Component Value Date/Time   LDLCALC 128 (H) 12/07/2018 04:14 PM   LDLDIRECT 114.0 03/21/2020 02:55 PM   TRIG (H) 03/21/2020 02:55 PM    446.0 Triglyceride is over 400; calculations on Lipids are invalid. Marland Kitchen  Pharmacist Clinical Goal(s): o Over the next 30  days, patient will work with PharmD and providers to achieve LDL goal < 100 and TRIG < 150 . Current regimen:  o Atorvastatin 40 mg daily (not taking) o Vascepa 1 g - 2 cap twice a day . Interventions: o Discussed cholesterol goals and benefits of medications for prevention of heart attack / stroke o Recommend to restart atorvastatin 40 mg daily o Discussed dosing for Vascepa is 2 capsules twice a day - previously was only taking 2 capsules per day . Patient self care activities - Over the next 30 days, patient will: o Restart atorvastatin 40 mg daily o Increase Vascepa to 2 capsules AM and PM as prescribed  Diabetes Lab Results  Component Value Date/Time   HGBA1C 5.5 03/21/2020 02:55 PM   HGBA1C 6.0 10/12/2019 11:58 AM   HGBA1C 8.7 05/25/2018 12:00 AM   HGBA1C 8.7 05/25/2018 12:00 AM   HGBA1C 6.9 (H) 12/25/2015 10:55 AM .  Pharmacist Clinical Goal(s): o Over the  next 30 days, patient will work with PharmD and providers to maintain A1c goal <7% . Current regimen:  o Farxiga 10 mg daily . Interventions: o Discussed blood sugar goals and benefits of medications for prevention of diabetic complications o Discussed mechanism of Farxiga and additional benefits for heart failure . Patient self care activities - Over the next 30 days, patient will: o Continue medication as prescribed  Bipolar Depression . Pharmacist Clinical Goal(s) o Over the next 30 days, patient will work with PharmD and providers to optimize therapy . Current regimen:  o Aripiprazole 5 mg daily o Sertraline 100 mg daily o Clonazepam 1 mg twice a day as needed . Interventions: o Discussed fatigue as medication side effects vs symptom of depression o Recommended to move medications to bedtime . Patient self care activities - Over the next 30 days, patient will: o Move aripiprazole and sertraline to bed time and see if daytime fatigue improves o Follow up with [name] as scheduled  Medication management . Pharmacist Clinical Goal(s): o Over the next 30 days, patient will work with PharmD and providers to maintain optimal medication adherence . Current [facility] . Interventions o Comprehensive medication review performed. o Continue current medication management strategy o Recommended use of Pill Box to organize AM and PM medications . Patient self care activities - Over the next 30 days, patient will: o Focus on medication adherence by pill box o Take medications as prescribed o Report any questions or concerns to PharmD and/or provider(s)  Initial goal documentation       Heart Failure / Hypertension   Type: Combined Systolic and Diastolic  Last ejection fraction 01/23/2020: 20-25% NYHA Class: II (slight limitation of activity) AHA HF Stage: C (Heart disease and symptoms present)  BP goal is:  <130/80 BP Readings from Last 3 Encounters:  06/22/20 120/80    05/15/20 110/80  04/12/20 (!) 93/58   Pulse Readings from Last 3 Encounters:  06/22/20 70  05/15/20 69  04/12/20 65   Kidney Function Lab Results  Component Value Date/Time   CREATININE 1.06 05/15/2020 02:52 PM   CREATININE 1.18 04/12/2020 12:26 PM   CREATININE 1.18 08/24/2017 10:59 AM   CREATININE 1.06 01/15/2017 12:11 PM   GFR 75.63 03/21/2020 02:55 PM   GFRNONAA >60 05/15/2020 02:52 PM   GFRAA >60 05/15/2020 02:52 PM   K 3.7 05/15/2020 02:52 PM   K 3.9 04/12/2020 12:26 PM   Wt Readings from Last 3 Encounters:  06/22/20 (!) 394 lb (178.7 kg)  05/15/20 (!) 388 lb 12.8 oz (176.4 kg)  04/12/20 (!) 386 lb (175.1 kg)   Checking BP at home: daily Home BP readings: 100/62  Patient has failed these meds in past: losartan Patient is currently uncontrolled on the following medications:  Marland Kitchen Metoprolol succinate 100 mg daily . Spironolactone 25 mg daily . Torsemide 20 mg daily . Valsartan 40 mg - 1/2 tab BID - not started . Farxiga 10 mg daily . Potassium chloride 20 mEq - 2 tab daily PRN cramping  We discussed diet and exercise extensively; benefits of beta blocker, ARB, SGLT2, and aldosterone antagonist for HF morbidity and mortality. Pt has not started valsartan due to low BP/feeling fatigued.  Patient monitors weight daily - in the past his fluid pill has dropped him 20 lbs in 1 day. Now he takes torsemide about 2 days per week, always causes frequent urination and weight loss. Discussed benefits of taking torsemide daily to manage fluid status more steadily.  Pt complains of fatigue throughout the day preventing him from exercising and moving around; discussed beta blocker side effects, potential benefits of moving metoprolol to bedtime. Also recommended to discuss these concerns with cardiologist to optimize medication doses.  Plan  Continue current medications  Move metoprolol to evening Take torsemide daily as prescribed Reschedule missed appt with [name]  AFIB    Patient is currently rate controlled. Office heart rates are  Pulse Readings from Last 3 Encounters:  06/22/20 70  05/15/20 69  04/12/20 65   CHA2DS2-VASc Score = 3  The patient's score is based upon: CHF History: 1 HTN History: 1 Age : 0 Diabetes History: 1 Stroke History: 0 Vascular Disease History: 0 Gender: 0  { Patient has failed these meds in past: amiodarone, digoxin Patient is currently controlled on the following medications:  Marland Kitchen Metoprolol succinate 100 mg daily . Xarelto 20 mg daily  We discussed:  Benefits of Xarelto for prevention of stroke; pt endorses compliance and denies issues  Plan  Continue current medications  Hyperlipidemia   LDL goal < 100  Lipid Panel     Component Value Date/Time   CHOL 185 03/21/2020 1455   TRIG (H) 03/21/2020 1455    446.0 Triglyceride is over 400; calculations on Lipids are invalid.   HDL 28.00 (L) 03/21/2020 1455   LDLCALC 128 (H) 12/07/2018 1614   LDLDIRECT 114.0 03/21/2020 1455    Hepatic Function Latest Ref Rng & Units 03/21/2020 10/11/2019 04/12/2019  Total Protein 6.0 - 8.3 g/dL 7.3 6.8 7.9  Albumin 3.5 - 5.2 g/dL 3.8 3.1(L) 3.9  AST 0 - 37 U/L 23 16 16   ALT 0 - 53 U/L 29 24 25   Alk Phosphatase 39 - 117 U/L 90 73 103  Total Bilirubin 0.2 - 1.2 mg/dL 0.5 0.7 0.8  Bilirubin, Direct 0.0 - 0.3 mg/dL 0.1 - -    The 10-year ASCVD risk score [name], et al., 2013) is: 17.3%   Values used to calculate the score:     Age: 21 years     Sex: Male     Is Non-Hispanic African American: Yes     Diabetic: Yes     Tobacco smoker: No     Systolic Blood Pressure: 765 mmHg     Is BP treated: Yes     HDL Cholesterol: 28 mg/dL     Total Cholesterol: 185 mg/dL   Patient has failed these meds in past: fenofibrate Patient is currently controlled on the following medications:  .  Atorvastatin 40 mg daily . Vascepa 1 g - 2 cap BID  We discussed:  diet and exercise extensively; pt was taking Vascepa only 2 capsules daily  due to misreading instructions; pt has not been taking atorvastatin - he denies side effects, he was concerned it was contributing to fatigue; discussed cholesterol goals and benefits of statin/Vascepa for ASCVD risk reduction  Plan  Restart atorvastatin 40 mg daily as prescribed Increase Vascepa to 2 capsules BID as prescribed  Diabetes   A1c goal <7%  Recent Relevant Labs: Lab Results  Component Value Date/Time   HGBA1C 5.5 03/21/2020 02:55 PM   HGBA1C 6.0 10/12/2019 11:58 AM   HGBA1C 8.7 05/25/2018 12:00 AM   HGBA1C 8.7 05/25/2018 12:00 AM   HGBA1C 6.9 (H) 12/25/2015 10:55 AM   GFR 75.63 03/21/2020 02:55 PM   GFR 69.72 01/05/2020 11:12 AM   MICROALBUR 0.9 01/19/2019 04:39 PM   MICROALBUR 0.8 02/02/2018 04:57 PM    Last diabetic Eye exam:  Lab Results  Component Value Date/Time   HMDIABEYEEXA No Retinopathy 01/26/2019 12:00 AM    Last diabetic Foot exam: No results found for: HMDIABFOOTEX   Checking BG: Never   Patient has failed these meds in past: metformin, Tyler Aas, Januvia Patient is currently controlled on the following medications: . Farxiga 10 mg daily  We discussed: diet and exercise extensively  Plan  Continue current medications and control with diet and exercise  Depression / Bipolar I   Managed per [name]  Depression screen [facility] 2/9 12/21/2019 10/12/2019 12/16/2018  Decreased Interest 3 3 3   Down, Depressed, Hopeless 2 2 3   PHQ - 2 Score 5 5 6   Altered sleeping 3 3 3   Tired, decreased energy 2 3 3   Change in appetite 3 3 3   Feeling bad or failure about yourself  2 3 3   Trouble concentrating 2 2 3   Moving slowly or fidgety/restless 0 1 2  Suicidal thoughts 0 0 0  PHQ-9 Score 17 20 23   Difficult doing work/chores Very difficult Very difficult Very difficult  Some recent data might be hidden   Patient has failed these meds in past: Vraylar, bupropion, citalopram, duloxetine, escitalopram, Latuda, nortiptyline, venlafaxine, risperidone,  Viibryd  Patient is currently uncontrolled on the following medications:  . Aripiprazole 5 mg daily . Sertraline 100 mg daily . Clonazepam 1 mg BID prn  We discussed:  Pt endorses significant fatigue and lack of motivation; he is not sure if medication related or symptoms of uncontrolled depression; discussed potential benefits of moving medications to bedtime to help with daytime fatigue and potentially with sleep.  Plan  Move aripiprazole and sertraline to bedtime Follow up with [name] as scheduled  Tobacco Abuse   Tobacco Status:  Social History   Tobacco Use  Smoking Status Former Smoker  . Packs/day: 0.50  . Years: 30.00  . Pack years: 15.00  . Types: Cigarettes  . Quit date: 05/15/2020  . Years since quitting: 0.2  Smokeless Tobacco Never Used  Tobacco Comment   vaping - nicotine-free products   Previous quit attempts included: Chantix  We discussed:  Pt reports he quit smoking cigarettes 2 months ago; he is now vaping instead; discussed vaping is likely an improvement over cigarettes, however vaping carries its own risks as well; recommended to at minimum switch to nicotine-free products, but ideally quit vaping altogether.  Plan  Quit vaping  GERD   Patient has failed these meds in past: n/a Patient is currently controlled on the  following medications:  . Pantoprazole 40 mg daily  We discussed: reflux worsens off of PPI.  Plan  Continue current medications  Medication Management   [facility] Uses pill box? No - prefers bottles Pt endorses 100% compliance  We discussed: Discussed benefits of med sync, packaging and delivery with [facility]. Patient is interested in switching but has recently moved to [place] [facility], will check if that is within the delivery radius for [facility].  Plan  Continue current medication management strategy    Follow up: 1 month phone visit  Charlene Brooke, PharmD, BCACP Clinical  Pharmacist [facility] [phone]

## 2020-07-27 ENCOUNTER — Other Ambulatory Visit: Payer: Self-pay

## 2020-07-27 ENCOUNTER — Ambulatory Visit: Payer: Medicare HMO | Admitting: Pharmacist

## 2020-07-27 DIAGNOSIS — I5042 Chronic combined systolic (congestive) and diastolic (congestive) heart failure: Secondary | ICD-10-CM

## 2020-07-27 DIAGNOSIS — E785 Hyperlipidemia, unspecified: Secondary | ICD-10-CM

## 2020-07-27 DIAGNOSIS — I480 Paroxysmal atrial fibrillation: Secondary | ICD-10-CM

## 2020-07-27 DIAGNOSIS — I10 Essential (primary) hypertension: Secondary | ICD-10-CM

## 2020-07-27 DIAGNOSIS — E118 Type 2 diabetes mellitus with unspecified complications: Secondary | ICD-10-CM

## 2020-07-27 NOTE — Patient Instructions (Addendum)
Visit Information  Phone number for Pharmacist: [phone]  Thank you for meeting with me to discuss your medications! I look forward to working with you to achieve your health care goals. Below is a summary of what we talked about during the visit:  Recommendations: - Move metoprolol succinate 100 mg to bedtime - Move aripiprazole and sertraline to bedtime - Start taking torsemide every day to maintain fluid status more steadily - Restart atorvastatin 40 mg for cholesterol and heart attack prevention - Take Vascepa - 2 capsules in AM and 2 capsules in PM for full benefits - Organize medications in an AM / PM pill box - Reschedule appt with [name] and discuss medication side effects  - Keep appt with [name]  Goals Addressed            This Visit's Progress   . Pharmacy Care Plan       CARE PLAN ENTRY (see longitudinal plan of care for additional care plan information)  Current Barriers:  . Chronic Disease Management support, education, and care coordination needs related to Hypertension, Hyperlipidemia, Diabetes, Atrial Fibrillation, and Heart Failure   Hypertension / Heart Failure BP Readings from Last 3 Encounters:  06/22/20 120/80  05/15/20 110/80  04/12/20 (!) 93/58 .  Pharmacist Clinical Goal(s): o Over the next 30 days, patient will work with PharmD and providers to maintain BP goal <130/80 . Current regimen:  o Metoprolol succinate 100 mg daily o Spironolactone 25 mg daily o Torsemide 20-30 mg daily as needed o Valsartan 40 mg - 1/2 tab twice a day (not started) o Farxiga 10 mg daily o Potassium chloride 20 mEq - 2 tab daily as needed . Interventions: o Discussed benefits of beta blockers, valsartan, spironolactone, and Farxiga for reducing risk for heart failure hospitalizations and prolonging life o Discussed side effects of metoprolol and potential benefits to moving dose to evening o Recommend daily use of torsemide to maintain fluid status o Recommend  discussing optimal metoprolol dose with cardiologist given side effects of fatigue . Patient self care activities - Over the next 30 days, patient will: o Check BP daily, document, and provide at future appointments o Ensure daily salt intake < 2300 mg/day o Move metoprolol to evening o Reschedule appt with [name] to discuss medications/side effects  Hyperlipidemia Lab Results  Component Value Date/Time   LDLCALC 128 (H) 12/07/2018 04:14 PM   LDLDIRECT 114.0 03/21/2020 02:55 PM   TRIG (H) 03/21/2020 02:55 PM    446.0 Triglyceride is over 400; calculations on Lipids are invalid. Marland Kitchen  Pharmacist Clinical Goal(s): o Over the next 30 days, patient will work with PharmD and providers to achieve LDL goal < 100 and TRIG < 150 . Current regimen:  o Atorvastatin 40 mg daily (not taking) o Vascepa 1 g - 2 cap twice a day . Interventions: o Discussed cholesterol goals and benefits of medications for prevention of heart attack / stroke o Recommend to restart atorvastatin 40 mg daily o Discussed dosing for Vascepa is 2 capsules twice a day - previously was only taking 2 capsules per day . Patient self care activities - Over the next 30 days, patient will: o Restart atorvastatin 40 mg daily o Increase Vascepa to 2 capsules AM and PM as prescribed  Diabetes Lab Results  Component Value Date/Time   HGBA1C 5.5 03/21/2020 02:55 PM   HGBA1C 6.0 10/12/2019 11:58 AM   HGBA1C 8.7 05/25/2018 12:00 AM   HGBA1C 8.7 05/25/2018 12:00 AM  HGBA1C 6.9 (H) 12/25/2015 10:55 AM .  Pharmacist Clinical Goal(s): o Over the next 30 days, patient will work with PharmD and providers to maintain A1c goal <7% . Current regimen:  o Farxiga 10 mg daily . Interventions: o Discussed blood sugar goals and benefits of medications for prevention of diabetic complications o Discussed mechanism of Farxiga and additional benefits for heart failure . Patient self care activities - Over the next 30 days, patient  will: o Continue medication as prescribed  Bipolar Depression . Pharmacist Clinical Goal(s) o Over the next 30 days, patient will work with PharmD and providers to optimize therapy . Current regimen:  o Aripiprazole 5 mg daily o Sertraline 100 mg daily o Clonazepam 1 mg twice a day as needed . Interventions: o Discussed fatigue as medication side effects vs symptom of depression o Recommended to move medications to bedtime . Patient self care activities - Over the next 30 days, patient will: o Move aripiprazole and sertraline to bed time and see if daytime fatigue improves o Follow up with [name] as scheduled  Medication management . Pharmacist Clinical Goal(s): o Over the next 30 days, patient will work with PharmD and providers to maintain optimal medication adherence . Current pharmacy: [facility] . Interventions o Comprehensive medication review performed. o Continue current medication management strategy o Recommended use of Pill Box to organize AM and PM medications . Patient self care activities - Over the next 30 days, patient will: o Focus on medication adherence by pill box o Take medications as prescribed o Report any questions or concerns to PharmD and/or provider(s)  Initial goal documentation      [name] was given information about Chronic Care Management services today including:  1. CCM service includes personalized support from designated clinical staff supervised by his physician, including individualized plan of care and coordination with other care providers 2. 24/7 contact phone numbers for assistance for urgent and routine care needs. 3. Standard insurance, coinsurance, copays and deductibles apply for chronic care management only during months in which we provide at least 20 minutes of these services. Most insurances cover these services at 100%, however patients may be responsible for any copay, coinsurance and/or deductible if applicable. This service may  help you avoid the need for more expensive face-to-face services. 4. Only one practitioner may furnish and bill the service in a calendar month. 5. The patient may stop CCM services at any time (effective at the end of the month) by phone call to the office staff.  Patient agreed to services and verbal consent obtained.   Patient verbalizes understanding of instructions provided today.  Telephone follow up appointment with pharmacy team member scheduled for: 1 month  [name], PharmD, BCACP Clinical Pharmacist [facility] [phone]  High Triglycerides Eating Plan Triglycerides are a type of fat in the blood. High levels of triglycerides can increase your risk of heart disease and stroke. If your triglyceride levels are high, choosing the right foods can help lower your triglycerides and keep your heart healthy. Work with your health care provider or a diet and nutrition specialist (dietitian) to develop an eating plan that is right for you. What are tips for following this plan? General guidelines   Lose weight, if you are overweight. For most people, losing 5-10 lbs (2-5 kg) helps lower triglyceride levels. A weight-loss plan may include. ? 30 minutes of exercise at least 5 days a week. ? Reducing the amount of calories, sugar,  and fat you eat.  Eat a wide variety of fresh fruits, vegetables, and whole grains. These foods are high in fiber.  Eat foods that contain healthy fats, such as fatty fish, nuts, seeds, and olive oil.  Avoid foods that are high in added sugar, added salt (sodium), saturated fat, and trans fat.  Avoid low-fiber, refined carbohydrates such as white bread, crackers, noodles, and white rice.  Avoid foods with partially hydrogenated oils (trans fats), such as fried foods or stick margarine.  Limit alcohol intake to no more than 1 drink a day for nonpregnant women and 2 drinks a day for men. One drink equals 12 oz of beer, 5 oz  of wine, or 1 oz of hard liquor. Your health care provider may recommend that you drink less depending on your overall health. Reading food labels  Check food labels for the amount of saturated fat. Choose foods with no or very little saturated fat.  Check food labels for the amount of trans fat. Choose foods with no trans fat.  Check food labels for the amount of cholesterol. Choose foods low in cholesterol. Ask your dietitian how much cholesterol you should have each day.  Check food labels for the amount of sodium. Choose foods with less than 140 milligrams (mg) per serving. Shopping  Buy dairy products labeled as nonfat (skim) or low-fat (1%).  Avoid buying processed or prepackaged foods. These are often high in added sugar, sodium, and fat. Cooking  Choose healthy fats when cooking, such as olive oil or canola oil.  Cook foods using lower fat methods, such as baking, broiling, boiling, or grilling.  Make your own sauces, dressings, and marinades when possible, instead of buying them. Store-bought sauces, dressings, and marinades are often high in sodium and sugar. Meal planning  Eat more home-cooked food and less restaurant, buffet, and fast food.  Eat fatty fish at least 2 times each week. Examples of fatty fish include salmon, trout, mackerel, tuna, and herring.  If you eat whole eggs, do not eat more than 3 egg yolks per week. What foods are recommended? The items listed may not be a complete list. Talk with your dietitian about what dietary choices are best for you. Grains Whole wheat or whole grain breads, crackers, cereals, and pasta. Unsweetened oatmeal. Bulgur. Barley. Quinoa. Brown rice. Whole wheat flour tortillas. Vegetables Fresh or frozen vegetables. Low-sodium canned vegetables. Fruits All fresh, canned (in natural juice), or frozen fruits. Meats and other protein foods Skinless chicken or [place]. Ground chicken or [place]. Lean cuts of pork, trimmed of fat.  Fish and seafood, especially salmon, trout, and herring. Egg whites. Dried beans, peas, or lentils. Unsalted nuts or seeds. Unsalted canned beans. Natural peanut or almond butter. Dairy Low-fat dairy products. Skim or low-fat (1%) milk. Reduced fat (2%) and low-sodium cheese. Low-fat ricotta cheese. Low-fat cottage cheese. Plain, low-fat yogurt. Fats and oils Tub margarine without trans fats. Light or reduced-fat mayonnaise. Light or reduced-fat salad dressings. Avocado. Safflower, olive, sunflower, soybean, and canola oils. What foods are not recommended? The items listed may not be a complete list. Talk with your dietitian about what dietary choices are best for you. Grains White bread. White (regular) pasta. White rice. Cornbread. Bagels. Pastries. Crackers that contain trans fat. Vegetables Creamed or fried vegetables. Vegetables in a cheese sauce. Fruits Sweetened dried fruit. Canned fruit in syrup. Fruit juice. Meats and other protein foods Fatty cuts of meat. Ribs. Chicken wings. Berniece Salines. Sausage. Bologna. Salami. Chitterlings. Fatback. Hot dogs.  Bratwurst. Packaged lunch meats. Dairy Whole or reduced-fat (2%) milk. Half-and-half. Cream cheese. Full-fat or sweetened yogurt. Full-fat cheese. Nondairy creamers. Whipped toppings. Processed cheese or cheese spreads. Cheese curds. Beverages Alcohol. Sweetened drinks, such as soda, lemonade, fruit drinks, or punches. Fats and oils Butter. Stick margarine. Lard. Shortening. Ghee. Bacon fat. Tropical oils, such as coconut, palm kernel, or palm oils. Sweets and desserts Corn syrup. Sugars. Honey. Molasses. Candy. Jam and jelly. Syrup. Sweetened cereals. Cookies. Pies. Cakes. Donuts. Muffins. Ice cream. Condiments Store-bought sauces, dressings, and marinades that are high in sugar, such as ketchup and barbecue sauce. Summary  High levels of triglycerides can increase the risk of heart disease and stroke. Choosing the right foods can help lower  your triglycerides.  Eat plenty of fresh fruits, vegetables, and whole grains. Choose low-fat dairy and lean meats. Eat fatty fish at least twice a week.  Avoid processed and prepackaged foods with added sugar, sodium, saturated fat, and trans fat.  If you need suggestions or have questions about what types of food are good for you, talk with your health care provider or a dietitian. This information is not intended to replace advice given to you by your health care provider. Make sure you discuss any questions you have with your health care provider. Document Revised: 10/30/2017 Document Reviewed: 01/20/2017 Elsevier Patient Education  2020 [employer].

## 2020-07-29 ENCOUNTER — Other Ambulatory Visit (HOSPITAL_COMMUNITY): Payer: Self-pay | Admitting: Psychiatry

## 2020-07-29 DIAGNOSIS — F3175 Bipolar disorder, in partial remission, most recent episode depressed: Secondary | ICD-10-CM

## 2020-07-29 DIAGNOSIS — F410 Panic disorder [episodic paroxysmal anxiety] without agoraphobia: Secondary | ICD-10-CM

## 2020-07-30 NOTE — Addendum Note (Signed)
Addended by: Juliet Rude on: 07/30/2020 12:23 PM   Modules accepted: Orders

## 2020-08-08 DIAGNOSIS — G4733 Obstructive sleep apnea (adult) (pediatric): Secondary | ICD-10-CM | POA: Diagnosis not present

## 2020-08-22 ENCOUNTER — Telehealth (INDEPENDENT_AMBULATORY_CARE_PROVIDER_SITE_OTHER): Payer: Medicare HMO | Admitting: Psychiatry

## 2020-08-22 ENCOUNTER — Other Ambulatory Visit: Payer: Self-pay

## 2020-08-22 ENCOUNTER — Encounter (HOSPITAL_COMMUNITY): Payer: Self-pay | Admitting: Psychiatry

## 2020-08-22 DIAGNOSIS — R69 Illness, unspecified: Secondary | ICD-10-CM | POA: Diagnosis not present

## 2020-08-22 DIAGNOSIS — F3175 Bipolar disorder, in partial remission, most recent episode depressed: Secondary | ICD-10-CM

## 2020-08-22 DIAGNOSIS — F410 Panic disorder [episodic paroxysmal anxiety] without agoraphobia: Secondary | ICD-10-CM | POA: Diagnosis not present

## 2020-08-22 DIAGNOSIS — Z630 Problems in relationship with spouse or partner: Secondary | ICD-10-CM | POA: Diagnosis not present

## 2020-08-22 MED ORDER — SERTRALINE HCL 100 MG PO TABS: 100.0000 mg | ORAL_TABLET | Freq: Every day | ORAL | 0 refills | Status: DC

## 2020-08-22 MED ORDER — CLONAZEPAM 1 MG PO TABS: 1.0000 mg | ORAL_TABLET | Freq: Two times a day (BID) | ORAL | 1 refills | Status: DC | PRN

## 2020-08-22 MED ORDER — ARIPIPRAZOLE 10 MG PO TABS: 10.0000 mg | ORAL_TABLET | Freq: Every day | ORAL | 1 refills | Status: DC
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
## 2020-08-24 ENCOUNTER — Telehealth: Payer: Medicare HMO

## 2020-08-24 NOTE — Chronic Care Management (AMB) (Deleted)
Chronic Care Management Pharmacy  Name: [name]  MRN: [id number] DOB: 06/09/1966   Chief Complaint/ HPI  [name],  54 y.o. , male presents for their Initial CCM visit with the clinical pharmacist via telephone due to COVID-19 Pandemic.  PCP : [name], MD Patient Care Team: [name], MD as PCP - General (Internal Medicine) [name], MD as PCP - Cardiology (Cardiology) [name], MD as PCP - Advanced Heart Failure (Cardiology) [name], MD as Consulting Physician (Cardiology) [name], [facility] as Pharmacist (Pharmacist)  Their chronic conditions include: Hypertension, Hyperlipidemia, Diabetes, Atrial Fibrillation, Heart Failure, GERD, Depression, Anxiety, Osteoarthritis, Tobacco use and BPH, Bipolar I disorder, NASH, OSA, Hx PE, NICM  Pt recently married his wife in June and moved to [place] to live together. He has trouble sleeping at night with BiPAP, endorses frequent awakenings and sleeping upright in armchair for a few hours each night. He has trouble with motivation and energy - he wants to be able to exercise but believes medication side effects are preventing him from doing what he wants.  Office Visits: 06/22/20 [name] OV: acute visit for pubic abscess - rx Bactrim and mupirocin  03/21/20 [name] OV: chronic f/u. Stop insulin d/t A1c 5.5. High Trig- Compliance counseling for Vascepa, avoid alcohol, decrease fat/sugar intake.  Consult Visit: 06/27/20 [name] (psych): increased sertraline to 100 mg daily, plan to titrate to 200 mg for optimal effect. Viibyrd back down to 20 mg.  05/15/20 [name] pharmacy appt: start valsartan 20 mg BID and Chantix.  05/01/20 [name] (psych): increase Viibryd to 40 mg  04/12/20 [name] (HF clinic): pt stopped losartan b/c he thought it was causing nosebleeds. No Entresto d/t hx angioedema. Restart Vascepa  03/05/20 [name] (psych): dx Bipolar 1 and panic disorder. Continue  clonazepam, start sertraline and Abilify for mood sx.  Allergies  Allergen Reactions  . Ace Inhibitors Anaphylaxis and Swelling    Angioedema  . Bidil [Isosorb Dinitrate-Hydralazine] Other (See Comments)    headache  . Digoxin And Related     Unspecified "side effects"  . Buspirone Other (See Comments)    dizziness   Medications: Outpatient Encounter Medications as of 08/24/2020  Medication Sig  . ARIPiprazole (ABILIFY) 10 MG tablet Take 1 tablet (10 mg total) by mouth daily.  Marland Kitchen atorvastatin (LIPITOR) 40 MG tablet Take 1 tablet (40 mg total) by mouth daily. (Patient not taking: Reported on 07/27/2020)  . blood glucose meter kit and supplies KIT Use to check blood sugar. DX E11.9  . clonazePAM (KLONOPIN) 1 MG tablet Take 1 tablet (1 mg total) by mouth 2 (two) times daily as needed for anxiety.  Marland Kitchen FARXIGA 10 MG TABS tablet TAKE 1 TABLET BY MOUTH EVERY DAY  . icosapent Ethyl (VASCEPA) 1 g capsule Take 2 capsules (2 g total) by mouth 2 (two) times daily.  . metoprolol succinate (TOPROL-XL) 100 MG 24 hr tablet TAKE 1 TABLET (100 MG TOTAL) BY MOUTH DAILY. TAKE WITH OR IMMEDIATELY FOLLOWING A MEAL.  . mupirocin nasal ointment (BACTROBAN) 2 % Place 1 application into the nose 2 (two) times daily. Use one-half of tube in each nostril twice daily for five (5) days. After application, press sides of nose together and gently massage.  . pantoprazole (PROTONIX) 40 MG tablet TAKE 1 TABLET BY MOUTH EVERY DAY  . potassium chloride SA (KLOR-CON) 20 MEQ tablet Take 40 mEq by mouth daily as needed.  Marland Kitchen  PRESCRIPTION MEDICATION Inhale into the lungs See admin instructions. Bipap, pressure 16/12 with 2L of O2 - use whenever sleeping  . rivaroxaban (XARELTO) 20 MG TABS tablet Take 1 tablet (20 mg total) by mouth daily.  . sertraline (ZOLOFT) 100 MG tablet Take 1 tablet (100 mg total) by mouth daily.  . sildenafil (REVATIO) 20 MG tablet Take 4 tablets (80 mg total) by mouth daily as needed.  Marland Kitchen spironolactone  (ALDACTONE) 25 MG tablet TAKE 1 TABLET BY MOUTH EVERY DAY  . sulfamethoxazole-trimethoprim (BACTRIM DS) 800-160 MG tablet Take 1 tablet by mouth 2 (two) times daily.  Marland Kitchen torsemide (DEMADEX) 20 MG tablet Take 20-30 mg by mouth daily.  . valsartan (DIOVAN) 40 MG tablet Take 0.5 tablets (20 mg total) by mouth 2 (two) times daily.  . varenicline (CHANTIX STARTING MONTH PAK) 0.5 MG X 11 & 1 MG X 42 tablet Take one 0.5 mg tablet by mouth once daily for 3 days, then increase to one 0.5 mg tablet twice daily for 4 days, then increase to one 1 mg tablet twice daily. (Patient not taking: Reported on 07/27/2020)  . [DISCONTINUED] dapagliflozin propanediol (FARXIGA) 10 MG TABS tablet Take 10 mg by mouth daily.  . [DISCONTINUED] pravastatin (PRAVACHOL) 40 MG tablet Take 40 mg by mouth daily.   No facility-administered encounter medications on file as of 08/24/2020.     Current Diagnosis/Assessment:    Goals Addressed   None     Heart Failure / Hypertension   Type: Combined Systolic and Diastolic  Last ejection fraction 01/23/2020: 20-25% NYHA Class: II (slight limitation of activity) AHA HF Stage: C (Heart disease and symptoms present)  BP goal is:  <130/80 BP Readings from Last 3 Encounters:  06/22/20 120/80  05/15/20 110/80  04/12/20 (!) 93/58   Pulse Readings from Last 3 Encounters:  06/22/20 70  05/15/20 69  04/12/20 65   Kidney Function Lab Results  Component Value Date/Time   CREATININE 1.06 05/15/2020 02:52 PM   CREATININE 1.18 04/12/2020 12:26 PM   CREATININE 1.18 08/24/2017 10:59 AM   CREATININE 1.06 01/15/2017 12:11 PM   GFR 75.63 03/21/2020 02:55 PM   GFRNONAA >60 05/15/2020 02:52 PM   GFRAA >60 05/15/2020 02:52 PM   K 3.7 05/15/2020 02:52 PM   K 3.9 04/12/2020 12:26 PM   Wt Readings from Last 3 Encounters:  06/22/20 (!) 394 lb (178.7 kg)  05/15/20 (!) 388 lb 12.8 oz (176.4 kg)  04/12/20 (!) 386 lb (175.1 kg)   Checking BP at home: daily Home BP readings:  100/62  Patient has failed these meds in past: losartan Patient is currently uncontrolled on the following medications:  Marland Kitchen Metoprolol succinate 100 mg daily . Spironolactone 25 mg daily . Torsemide 20 mg daily . Valsartan 40 mg - 1/2 tab BID - not started . Farxiga 10 mg daily . Potassium chloride 20 mEq - 2 tab daily PRN cramping  We discussed diet and exercise extensively; benefits of beta blocker, ARB, SGLT2, and aldosterone antagonist for HF morbidity and mortality. Pt has not started valsartan due to low BP/feeling fatigued.  Patient monitors weight daily - in the past his fluid pill has dropped him 20 lbs in 1 day. Now he takes torsemide about 2 days per week, always causes frequent urination and weight loss. Discussed benefits of taking torsemide daily to manage fluid status more steadily.  Pt complains of fatigue throughout the day preventing him from exercising and moving around; discussed beta blocker side effects, potential benefits  of moving metoprolol to bedtime. Also recommended to discuss these concerns with cardiologist to optimize medication doses.  Plan  Continue current medications  Move metoprolol to evening Take torsemide daily as prescribed Reschedule missed appt with [name]  AFIB   Patient is currently rate controlled. Office heart rates are  Pulse Readings from Last 3 Encounters:  06/22/20 70  05/15/20 69  04/12/20 65   CHA2DS2-VASc Score = 3  The patient's score is based upon: CHF History: 1 HTN History: 1 Age : 0 Diabetes History: 1 Stroke History: 0 Vascular Disease History: 0 Gender: 0  { Patient has failed these meds in past: amiodarone, digoxin Patient is currently controlled on the following medications:  Marland Kitchen Metoprolol succinate 100 mg daily . Xarelto 20 mg daily  We discussed:  Benefits of Xarelto for prevention of stroke; pt endorses compliance and denies issues  Plan  Continue current medications  Hyperlipidemia   LDL goal <  100  Lipid Panel     Component Value Date/Time   CHOL 185 03/21/2020 1455   TRIG (H) 03/21/2020 1455    446.0 Triglyceride is over 400; calculations on Lipids are invalid.   HDL 28.00 (L) 03/21/2020 1455   LDLCALC 128 (H) 12/07/2018 1614   LDLDIRECT 114.0 03/21/2020 1455    Hepatic Function Latest Ref Rng & Units 03/21/2020 10/11/2019 04/12/2019  Total Protein 6.0 - 8.3 g/dL 7.3 6.8 7.9  Albumin 3.5 - 5.2 g/dL 3.8 3.1(L) 3.9  AST 0 - 37 U/L 23 16 16   ALT 0 - 53 U/L 29 24 25   Alk Phosphatase 39 - 117 U/L 90 73 103  Total Bilirubin 0.2 - 1.2 mg/dL 0.5 0.7 0.8  Bilirubin, Direct 0.0 - 0.3 mg/dL 0.1 - -    The 10-year ASCVD risk score [name], et al., 2013) is: 17.3%   Values used to calculate the score:     Age: 72 years     Sex: Male     Is Non-Hispanic African American: Yes     Diabetic: Yes     Tobacco smoker: No     Systolic Blood Pressure: 121 mmHg     Is BP treated: Yes     HDL Cholesterol: 28 mg/dL     Total Cholesterol: 185 mg/dL   Patient has failed these meds in past: fenofibrate Patient is currently controlled on the following medications:  . Atorvastatin 40 mg daily . Vascepa 1 g - 2 cap BID  We discussed:  diet and exercise extensively; pt was taking Vascepa only 2 capsules daily due to misreading instructions; pt has not been taking atorvastatin - he denies side effects, he was concerned it was contributing to fatigue; discussed cholesterol goals and benefits of statin/Vascepa for ASCVD risk reduction  Plan  Restart atorvastatin 40 mg daily as prescribed Increase Vascepa to 2 capsules BID as prescribed  Diabetes   A1c goal <7%  Recent Relevant Labs: Lab Results  Component Value Date/Time   HGBA1C 5.5 03/21/2020 02:55 PM   HGBA1C 6.0 10/12/2019 11:58 AM   HGBA1C 8.7 05/25/2018 12:00 AM   HGBA1C 8.7 05/25/2018 12:00 AM   HGBA1C 6.9 (H) 12/25/2015 10:55 AM   GFR 75.63 03/21/2020 02:55 PM   GFR 69.72 01/05/2020 11:12 AM   MICROALBUR 0.9 01/19/2019  04:39 PM   MICROALBUR 0.8 02/02/2018 04:57 PM    Last diabetic Eye exam:  Lab Results  Component Value Date/Time   HMDIABEYEEXA No Retinopathy 01/26/2019 12:00 AM    Last  diabetic Foot exam: No results found for: HMDIABFOOTEX   Checking BG: Never   Patient has failed these meds in past: metformin, Tyler Aas, Januvia Patient is currently controlled on the following medications: . Farxiga 10 mg daily  We discussed: diet and exercise extensively  Plan  Continue current medications and control with diet and exercise  Depression / Bipolar I   Managed per [name]  Depression screen [facility] 2/9 12/21/2019 10/12/2019 12/16/2018  Decreased Interest 3 3 3   Down, Depressed, Hopeless 2 2 3   PHQ - 2 Score 5 5 6   Altered sleeping 3 3 3   Tired, decreased energy 2 3 3   Change in appetite 3 3 3   Feeling bad or failure about yourself  2 3 3   Trouble concentrating 2 2 3   Moving slowly or fidgety/restless 0 1 2  Suicidal thoughts 0 0 0  PHQ-9 Score 17 20 23   Difficult doing work/chores Very difficult Very difficult Very difficult  Some recent data might be hidden   Patient has failed these meds in past: Vraylar, bupropion, citalopram, duloxetine, escitalopram, Latuda, nortiptyline, venlafaxine, risperidone, Viibryd  Patient is currently uncontrolled on the following medications:  . Aripiprazole 5 mg daily . Sertraline 100 mg daily . Clonazepam 1 mg BID prn  We discussed:  Pt endorses significant fatigue and lack of motivation; he is not sure if medication related or symptoms of uncontrolled depression; discussed potential benefits of moving medications to bedtime to help with daytime fatigue and potentially with sleep.  Plan  Move aripiprazole and sertraline to bedtime Follow up with [name] as scheduled  Tobacco Abuse   Tobacco Status:  Social History   Tobacco Use  Smoking Status Former Smoker  . Packs/day: 0.50  . Years: 30.00  . Pack years: 15.00  . Types: Cigarettes  . Quit  date: 05/15/2020  . Years since quitting: 0.2  Smokeless Tobacco Never Used  Tobacco Comment   vaping - nicotine-free products   Previous quit attempts included: Chantix  We discussed:  Pt reports he quit smoking cigarettes 2 months ago; he is now vaping instead; discussed vaping is likely an improvement over cigarettes, however vaping carries its own risks as well; recommended to at minimum switch to nicotine-free products, but ideally quit vaping altogether.  Plan  Quit vaping  GERD   Patient has failed these meds in past: n/a Patient is currently controlled on the following medications:  . Pantoprazole 40 mg daily  We discussed: reflux worsens off of PPI.  Plan  Continue current medications  Medication Management   Pt uses [facility] for all medications Uses pill box? No - prefers bottles Pt endorses 100% compliance  We discussed: Discussed benefits of med sync, packaging and delivery with [facility]. Patient is interested in switching but has recently moved to [place], will check if that is within the delivery radius for [facility].  Plan  Continue current medication management strategy    Follow up: 1 month phone visit  Charlene Brooke, PharmD, BCACP Clinical Pharmacist [facility] [phone]

## 2020-08-30 DIAGNOSIS — G4733 Obstructive sleep apnea (adult) (pediatric): Secondary | ICD-10-CM | POA: Diagnosis not present

## 2020-09-09 ENCOUNTER — Other Ambulatory Visit: Payer: Self-pay | Admitting: Internal Medicine

## 2020-09-09 DIAGNOSIS — K219 Gastro-esophageal reflux disease without esophagitis: Secondary | ICD-10-CM

## 2020-09-26 ENCOUNTER — Other Ambulatory Visit (HOSPITAL_COMMUNITY): Payer: Self-pay | Admitting: Psychiatry

## 2020-09-26 DIAGNOSIS — F3175 Bipolar disorder, in partial remission, most recent episode depressed: Secondary | ICD-10-CM

## 2020-09-29 DIAGNOSIS — G4733 Obstructive sleep apnea (adult) (pediatric): Secondary | ICD-10-CM | POA: Diagnosis not present

## 2020-10-01 ENCOUNTER — Other Ambulatory Visit (HOSPITAL_COMMUNITY): Payer: Self-pay | Admitting: Cardiology

## 2020-10-01 ENCOUNTER — Other Ambulatory Visit: Payer: Self-pay | Admitting: Internal Medicine

## 2020-10-01 DIAGNOSIS — E781 Pure hyperglyceridemia: Secondary | ICD-10-CM

## 2020-10-01 DIAGNOSIS — E785 Hyperlipidemia, unspecified: Secondary | ICD-10-CM

## 2020-10-05 ENCOUNTER — Telehealth: Payer: Self-pay

## 2020-10-05 NOTE — Telephone Encounter (Signed)
Pt unsure if he still has the kit.  He will check & call me back to let me know if I need to have another one sent to his home.

## 2020-10-11 ENCOUNTER — Telehealth: Payer: Self-pay | Admitting: Pharmacist

## 2020-10-12 ENCOUNTER — Telehealth: Payer: Self-pay | Admitting: Internal Medicine

## 2020-10-12 DIAGNOSIS — E876 Hypokalemia: Secondary | ICD-10-CM | POA: Diagnosis not present

## 2020-10-12 DIAGNOSIS — Z86711 Personal history of pulmonary embolism: Secondary | ICD-10-CM | POA: Diagnosis not present

## 2020-10-12 DIAGNOSIS — R0789 Other chest pain: Secondary | ICD-10-CM | POA: Diagnosis not present

## 2020-10-12 DIAGNOSIS — I110 Hypertensive heart disease with heart failure: Secondary | ICD-10-CM | POA: Diagnosis not present

## 2020-10-12 DIAGNOSIS — R0600 Dyspnea, unspecified: Secondary | ICD-10-CM | POA: Diagnosis not present

## 2020-10-12 DIAGNOSIS — Z6841 Body Mass Index (BMI) 40.0 and over, adult: Secondary | ICD-10-CM | POA: Diagnosis not present

## 2020-10-12 DIAGNOSIS — I509 Heart failure, unspecified: Secondary | ICD-10-CM | POA: Diagnosis not present

## 2020-10-12 DIAGNOSIS — E119 Type 2 diabetes mellitus without complications: Secondary | ICD-10-CM | POA: Diagnosis not present

## 2020-10-12 DIAGNOSIS — R140 Abdominal distension (gaseous): Secondary | ICD-10-CM | POA: Diagnosis not present

## 2020-10-12 DIAGNOSIS — Z20822 Contact with and (suspected) exposure to covid-19: Secondary | ICD-10-CM | POA: Diagnosis not present

## 2020-10-12 DIAGNOSIS — I459 Conduction disorder, unspecified: Secondary | ICD-10-CM | POA: Diagnosis not present

## 2020-10-12 NOTE — Telephone Encounter (Signed)
Message from team health at 10/12/2020 at 11:33am. Pt states chest discomfort and stomach bloating. Chest pain while breathing. Advised to go to ED. Pt did comply and understand. Pt made appt with PCP on 10/15/2020 at 11am.

## 2020-10-12 NOTE — Telephone Encounter (Signed)
   Patient calling to report chest discomfort and stomach bloating  Call transferred to [facility]

## 2020-10-15 ENCOUNTER — Ambulatory Visit: Payer: Medicare HMO | Admitting: Internal Medicine

## 2020-10-18 ENCOUNTER — Encounter (HOSPITAL_COMMUNITY): Payer: Self-pay | Admitting: Psychiatry

## 2020-10-18 ENCOUNTER — Other Ambulatory Visit: Payer: Self-pay

## 2020-10-18 ENCOUNTER — Telehealth (INDEPENDENT_AMBULATORY_CARE_PROVIDER_SITE_OTHER): Payer: Medicare HMO | Admitting: Psychiatry

## 2020-10-18 DIAGNOSIS — F410 Panic disorder [episodic paroxysmal anxiety] without agoraphobia: Secondary | ICD-10-CM

## 2020-10-18 DIAGNOSIS — R69 Illness, unspecified: Secondary | ICD-10-CM | POA: Diagnosis not present

## 2020-10-18 DIAGNOSIS — Z630 Problems in relationship with spouse or partner: Secondary | ICD-10-CM | POA: Diagnosis not present

## 2020-10-18 DIAGNOSIS — F3175 Bipolar disorder, in partial remission, most recent episode depressed: Secondary | ICD-10-CM

## 2020-10-18 MED ORDER — CLONAZEPAM 1 MG PO TABS: 1.0000 mg | ORAL_TABLET | Freq: Two times a day (BID) | ORAL | 1 refills | Status: DC | PRN

## 2020-10-18 MED ORDER — SERTRALINE HCL 100 MG PO TABS: 100.0000 mg | ORAL_TABLET | Freq: Every day | ORAL | 0 refills | Status: DC

## 2020-10-18 MED ORDER — ARIPIPRAZOLE 10 MG PO TABS: 10.0000 mg | ORAL_TABLET | Freq: Every day | ORAL | 0 refills | Status: DC

## 2020-10-18 NOTE — Progress Notes (Signed)
BH MD/PA/NP OP Progress Note  Virtual Visit via Video Note  I connected with [name] on 10/18/20 at  9:30 AM EST by a video enabled telemedicine application and verified that I am speaking with the correct person using two identifiers.  Location: Patient: Home Provider: Clinic   I discussed the limitations of evaluation and management by telemedicine and the availability of in person appointments. The patient expressed understanding and agreed to proceed.  I provided 18 minutes of non-face-to-face time during this encounter.    10/18/2020 9:38 AM [name]  MRN:  [id number]  Chief Complaint:  " Can you give me information for a marriage counselor ?"  HPI: Patient reported that he continues to be in constant conflict with his newly married wife.  He stated that they both keep getting into altercations and at times he loses his temper.  He stated that his wife has told him that she fears for her safety at times when he gets in those wages.  He stated that he gets easily irritated on certain things although he does not happen every day but whenever they have altercations de-escalate quickly.  He stated that he was packing his stuff before the appointment because he had the urge to leave the house and walk away. When writer asked where would he go he stated he would have gone to live with his ex because he has no other place to live. He stated that he really wants to save the marriage and is therefore asking for marriage counseling. Writer recommended that he contact his insurance provider to see which marriage counselor is covered by his insurance in his area.  Patient stated that he will do that today. Patient stated that he did not sleep well last night because he was thinking about everything that is happening between him and his wife. He does not think he needs anything for sleep because he does not want any medicine that will make him too groggy. His son is still living  with his ex and he misses his son a lot. When writer asked about the upcoming holidays, he stated that he hopefully will have a good time because he does not like he had his wife fight every single day. He however is seriously pursuing marriage counseling.  Visit Diagnosis:    ICD-10-CM   1. Bipolar 1 disorder, depressed, partial remission ([place])  F31.75   2. Marital conflict  G81.1   3. Panic disorder  F41.0     Past Psychiatric History: Bipolar disorder, anxiety, panic attacks  Past Medical History:  Past Medical History:  Diagnosis Date  . Alcohol abuse   . Anxiety state, unspecified   . Atrial fibrillation ([place])   . CHF (congestive heart failure) ([place])   . Chronic systolic heart failure ([place])   . Diabetes mellitus, type II ([place])   . Edema   . Gout   . History of medication noncompliance   . Migraine   . Obesity, unspecified   . Obstructive sleep apnea   . Psychiatric disorder   . Pulmonary embolism ([place])   . Shortness of breath     Past Surgical History:  Procedure Laterality Date  . CARDIAC CATHETERIZATION    . CARDIAC CATHETERIZATION N/A 08/13/2015   Procedure: Right/Left Heart Cath and Coronary Angiography;  Surgeon: [name], MD;  Location: [facility];  Service: Cardiovascular;  Laterality: N/A;  . RIGHT/LEFT HEART CATH AND CORONARY ANGIOGRAPHY N/A 04/14/2017   Procedure:  Right/Left Heart Cath and Coronary Angiography;  Surgeon: [name], MD;  Location: [facility];  Service: Cardiovascular;  Laterality: N/A;  . TESTICLE SURGERY      Family Psychiatric History: History of depression and bipolar in siblings  Family History:  Family History  Problem Relation Age of Onset  . Cancer Mother        brain tumor  . Hypertension Mother   . Diabetes Father        Deceased, 35  . Heart disease Maternal Grandmother   . Hypertension Other        Family History  . Stroke Other        Family History  . Diabetes Other        Family History   . Diabetes Daughter     Social History:  Social History   Socioeconomic History  . Marital status: Married    Spouse name: Not on file  . Number of children: 3  . Years of education: Not on file  . Highest education level: Not on file  Occupational History  . Occupation: DISABLED  Tobacco Use  . Smoking status: Former Smoker    Packs/day: 0.50    Years: 30.00    Pack years: 15.00    Types: Cigarettes    Quit date: 05/15/2020    Years since quitting: 0.4  . Smokeless tobacco: Never Used  . Tobacco comment: vaping - nicotine-free products  Vaping Use  . Vaping Use: Never used  Substance and Sexual Activity  . Alcohol use: Not Currently    Alcohol/week: 0.0 standard drinks  . Drug use: No  . Sexual activity: Not Currently  Other Topics Concern  . Not on file  Social History Narrative   He smokes about a pack per day and he has been smoking since he was 54 years of age.  He drinks alcohol occasionally, but he denies any illicit drug abuse.  He is presently on disability.    Lives with wife in a 2 story home.  Has 2 children.   Previously worked in [employer], last worked in 1998.   Highest level of education:  11th grade           Social Determinants of Health   Financial Resource Strain: Low Risk   . Difficulty of Paying Living Expenses: Not very hard  Food Insecurity:   . Worried About Charity fundraiser in the Last Year: Not on file  . Ran Out of Food in the Last Year: Not on file  Transportation Needs:   . Lack of Transportation (Medical): Not on file  . Lack of Transportation (Non-Medical): Not on file  Physical Activity:   . Days of Exercise per Week: Not on file  . Minutes of Exercise per Session: Not on file  Stress:   . Feeling of Stress : Not on file  Social Connections:   . Frequency of Communication with Friends and Family: Not on file  . Frequency of Social Gatherings with Friends and Family: Not on file  . Attends Religious Services: Not on file   . Active Member of Clubs or Organizations: Not on file  . Attends Archivist Meetings: Not on file  . Marital Status: Not on file    Allergies:  Allergies  Allergen Reactions  . Ace Inhibitors Anaphylaxis and Swelling    Angioedema  . Bidil [Isosorb Dinitrate-Hydralazine] Other (See Comments)    headache  . Digoxin And Related  Unspecified "side effects"  . Buspirone Other (See Comments)    dizziness    Metabolic Disorder Labs: Lab Results  Component Value Date   HGBA1C 5.5 03/21/2020   MPG 275 04/12/2019   MPG 142.72 11/21/2018   No results found for: PROLACTIN Lab Results  Component Value Date   CHOL 185 03/21/2020   TRIG (H) 03/21/2020    446.0 Triglyceride is over 400; calculations on Lipids are invalid.   HDL 28.00 (L) 03/21/2020   CHOLHDL 7 03/21/2020   VLDL 51.8 (H) 10/12/2019   LDLCALC 128 (H) 12/07/2018   LDLCALC 89 01/15/2017   Lab Results  Component Value Date   TSH 1.27 03/21/2020   TSH 1.54 01/19/2019    Therapeutic Level Labs: No results found for: LITHIUM No results found for: VALPROATE No components found for:  CBMZ  Current Medications: Current Outpatient Medications  Medication Sig Dispense Refill  . ARIPiprazole (ABILIFY) 10 MG tablet TAKE 1 TABLET BY MOUTH EVERY DAY 90 tablet 1  . atorvastatin (LIPITOR) 40 MG tablet TAKE 1 TABLET BY MOUTH EVERY DAY 90 tablet 1  . blood glucose meter kit and supplies KIT Use to check blood sugar. DX E11.9 1 each 0  . clonazePAM (KLONOPIN) 1 MG tablet Take 1 tablet (1 mg total) by mouth 2 (two) times daily as needed for anxiety. 60 tablet 1  . FARXIGA 10 MG TABS tablet TAKE 1 TABLET BY MOUTH EVERY DAY 90 tablet 1  . metoprolol succinate (TOPROL-XL) 100 MG 24 hr tablet TAKE 1 TABLET (100 MG TOTAL) BY MOUTH DAILY. TAKE WITH OR IMMEDIATELY FOLLOWING A MEAL. 90 tablet 3  . mupirocin nasal ointment (BACTROBAN) 2 % Place 1 application into the nose 2 (two) times daily. Use one-half of tube in each  nostril twice daily for five (5) days. After application, press sides of nose together and gently massage. 10 g 0  . pantoprazole (PROTONIX) 40 MG tablet TAKE 1 TABLET BY MOUTH EVERY DAY 90 tablet 1  . potassium chloride SA (KLOR-CON) 20 MEQ tablet Take 40 mEq by mouth daily as needed.    Marland Kitchen PRESCRIPTION MEDICATION Inhale into the lungs See admin instructions. Bipap, pressure 16/12 with 2L of O2 - use whenever sleeping    . rivaroxaban (XARELTO) 20 MG TABS tablet Take 1 tablet (20 mg total) by mouth daily. 90 tablet 1  . sertraline (ZOLOFT) 100 MG tablet Take 1 tablet (100 mg total) by mouth daily. 90 tablet 0  . sildenafil (REVATIO) 20 MG tablet Take 4 tablets (80 mg total) by mouth daily as needed. 60 tablet 5  . spironolactone (ALDACTONE) 25 MG tablet TAKE 1 TABLET BY MOUTH EVERY DAY 90 tablet 3  . sulfamethoxazole-trimethoprim (BACTRIM DS) 800-160 MG tablet Take 1 tablet by mouth 2 (two) times daily. 20 tablet 0  . torsemide (DEMADEX) 20 MG tablet Take 20-30 mg by mouth daily.    . valsartan (DIOVAN) 40 MG tablet Take 0.5 tablets (20 mg total) by mouth 2 (two) times daily. 30 tablet 11  . varenicline (CHANTIX STARTING MONTH PAK) 0.5 MG X 11 & 1 MG X 42 tablet Take one 0.5 mg tablet by mouth once daily for 3 days, then increase to one 0.5 mg tablet twice daily for 4 days, then increase to one 1 mg tablet twice daily. (Patient not taking: Reported on 07/27/2020) 53 tablet 0  . VASCEPA 1 g capsule TAKE 2 CAPSULES (2 G TOTAL) BY MOUTH 2 (TWO) TIMES DAILY. 360 capsule 1  No current facility-administered medications for this visit.       Psychiatric Specialty Exam: Review of Systems  There were no vitals taken for this visit.There is no height or weight on file to calculate BMI.  General Appearance: Fairly Groomed  Eye Contact:  Good  Speech:  Clear and Coherent  Volume:  Normal  Mood:  Irritable  Affect:  Congruent  Thought Process:  Coherent  Orientation:  Full (Time, Place, and Person)   Thought Content: WDL   Suicidal Thoughts:  No  Homicidal Thoughts:  No  Memory:  Immediate;   Good Recent;   Good  Judgement:  Good  Insight:  Good  Psychomotor Activity:  Normal  Concentration:  Concentration: Good  Recall:  Good  Fund of Knowledge: Good  Language: Good  Akathisia:  No  Handed:  Right  AIMS (if indicated): not done  Assets:  Communication Skills Desire for Improvement Financial Resources/Insurance Housing Intimacy  ADL's:  Intact  Cognition: WNL  Sleep:  Fair   Screenings: PHQ2-9     Office Visit from 12/21/2019 in [facility] at [facility] Visit from 10/12/2019 in [facility] Office Visit from 12/16/2018 in [facility] from 04/29/2018 in [facility] from 02/02/2018 in [facility]  PHQ-2 Total Score 5 5 6  0 0  PHQ-9 Total Score 17 20 23  0 --       Assessment and Plan: Patient continues to be under stress due to ongoing marital conflicts with his wife. He is looking for a marriage counselor.  Writer recommended to continue same regimen for now while he starts marriage counseling.  1. Bipolar 1 disorder, depressed, partial remission (H[facility])  - ARIPiprazole (ABILIFY) 10 MG tablet; Take 1 tablet (10 mg total) by mouth daily.  Dispense: 90 tablet; Refill: 0 - sertraline (ZOLOFT) 100 MG tablet; Take 1 tablet (100 mg total) by mouth daily.  Dispense: 90 tablet; Refill: 0  2. Marital conflict   3. Panic disorder  - sertraline (ZOLOFT) 100 MG tablet; Take 1 tablet (100 mg total) by mouth daily.  Dispense: 90 tablet; Refill: 0 - clonazePAM (KLONOPIN) 1 MG tablet; Take 1 tablet (1 mg total) by mouth 2 (two) times daily as needed for anxiety.  Dispense: 60 tablet; Refill: 1   Supportive therapy provided during the session. Patient in the process of looking for marriage counselor. F/up in 2 months.   Nevada Crane,  MD 10/18/2020, 9:38 AM

## 2020-10-19 NOTE — Progress Notes (Signed)
Chronic Care Management Pharmacy Assistant   Name: [name]  MRN: [id number] DOB: 1966-08-15  Reason for Encounter: General Adherence Call   PCP : [name], MD  Allergies:   Allergies  Allergen Reactions  . Ace Inhibitors Anaphylaxis and Swelling    Angioedema  . Bidil [Isosorb Dinitrate-Hydralazine] Other (See Comments)    headache  . Digoxin And Related     Unspecified "side effects"  . Buspirone Other (See Comments)    dizziness    Medications: Outpatient Encounter Medications as of 10/11/2020  Medication Sig  . atorvastatin (LIPITOR) 40 MG tablet TAKE 1 TABLET BY MOUTH EVERY DAY  . blood glucose meter kit and supplies KIT Use to check blood sugar. DX E11.9  . FARXIGA 10 MG TABS tablet TAKE 1 TABLET BY MOUTH EVERY DAY  . metoprolol succinate (TOPROL-XL) 100 MG 24 hr tablet TAKE 1 TABLET (100 MG TOTAL) BY MOUTH DAILY. TAKE WITH OR IMMEDIATELY FOLLOWING A MEAL.  . mupirocin nasal ointment (BACTROBAN) 2 % Place 1 application into the nose 2 (two) times daily. Use one-half of tube in each nostril twice daily for five (5) days. After application, press sides of nose together and gently massage.  . pantoprazole (PROTONIX) 40 MG tablet TAKE 1 TABLET BY MOUTH EVERY DAY  . potassium chloride SA (KLOR-CON) 20 MEQ tablet Take 40 mEq by mouth daily as needed.  Marland Kitchen PRESCRIPTION MEDICATION Inhale into the lungs See admin instructions. Bipap, pressure 16/12 with 2L of O2 - use whenever sleeping  . rivaroxaban (XARELTO) 20 MG TABS tablet Take 1 tablet (20 mg total) by mouth daily.  . sildenafil (REVATIO) 20 MG tablet Take 4 tablets (80 mg total) by mouth daily as needed.  Marland Kitchen spironolactone (ALDACTONE) 25 MG tablet TAKE 1 TABLET BY MOUTH EVERY DAY  . sulfamethoxazole-trimethoprim (BACTRIM DS) 800-160 MG tablet Take 1 tablet by mouth 2 (two) times daily.  Marland Kitchen torsemide (DEMADEX) 20 MG tablet Take 20-30 mg by mouth daily.  . valsartan (DIOVAN) 40 MG tablet Take 0.5 tablets (20 mg  total) by mouth 2 (two) times daily.  . varenicline (CHANTIX STARTING MONTH PAK) 0.5 MG X 11 & 1 MG X 42 tablet Take one 0.5 mg tablet by mouth once daily for 3 days, then increase to one 0.5 mg tablet twice daily for 4 days, then increase to one 1 mg tablet twice daily. (Patient not taking: Reported on 07/27/2020)  . VASCEPA 1 g capsule TAKE 2 CAPSULES (2 G TOTAL) BY MOUTH 2 (TWO) TIMES DAILY.  . [DISCONTINUED] ARIPiprazole (ABILIFY) 10 MG tablet TAKE 1 TABLET BY MOUTH EVERY DAY  . [DISCONTINUED] atorvastatin (LIPITOR) 40 MG tablet Take 1 tablet (40 mg total) by mouth daily. (Patient not taking: Reported on 07/27/2020)  . [DISCONTINUED] clonazePAM (KLONOPIN) 1 MG tablet Take 1 tablet (1 mg total) by mouth 2 (two) times daily as needed for anxiety.  . [DISCONTINUED] dapagliflozin propanediol (FARXIGA) 10 MG TABS tablet Take 10 mg by mouth daily.  . [DISCONTINUED] icosapent Ethyl (VASCEPA) 1 g capsule Take 2 capsules (2 g total) by mouth 2 (two) times daily.  . [DISCONTINUED] sertraline (ZOLOFT) 100 MG tablet Take 1 tablet (100 mg total) by mouth daily.   No facility-administered encounter medications on file as of 10/11/2020.    Current Diagnosis: Patient Active Problem List   Diagnosis Date Noted  . Marital conflict 18/84/1660  . Bipolar 1 disorder, depressed, partial remission ([place]) 06/27/2020  . Abscess 06/22/2020  . Bipolar I  disorder, most recent episode depressed ([place]) 03/05/2020  . Encounter for screening for HIV 01/05/2020  . Hyperlipidemia with target LDL less than 100 10/12/2019  . Screen for colon cancer 10/12/2019  . Hidradenitis suppurativa of left axilla 10/12/2019  . Benign prostatic hyperplasia without lower urinary tract symptoms 10/12/2019  . Claudication of both lower extremities ([place]) 04/20/2019  . Drug-induced erectile dysfunction 02/28/2019  . Vitamin D deficiency disease 01/20/2019  . Severe episode of recurrent major depressive disorder, with psychotic features ([place])  12/16/2018  . Bipolar 1 disorder, depressed ([place]) 12/16/2018  . Osteoarthritis of both ankles and feet 11/30/2018  . Diabetic neuropathy, painful ([place]) 11/30/2018  . Fatty liver disease, nonalcoholic 91/79/1505  . Thiamin deficiency 04/29/2018  . Cholelithiasis 04/22/2018  . Hyperbilirubinemia   . Routine general medical examination at a health care facility 02/03/2018  . PE (pulmonary thromboembolism) ([place]) 04/12/2017  . Panic disorder 01/15/2017  . Hereditary and idiopathic peripheral neuropathy 07/16/2016  . AF (paroxysmal atrial fibrillation) ([place])   . Osteoarthritis of both knees 12/27/2014  . NSVT (nonsustained ventricular tachycardia) ([place]) 09/26/2013  . Alcohol abuse 08/10/2013  . Hypertriglyceridemia 06/29/2013  . Nonischemic cardiomyopathy ([place]) 01/26/2013  . Type II diabetes mellitus with manifestations ([place]) 04/13/2012  . Tobacco user 02/18/2012  . ED (erectile dysfunction) 07/11/2011  . Morbid obesity ([place]) 06/11/2010  . Essential hypertension 06/11/2010  . Chronic combined systolic and diastolic CHF (congestive heart failure) ([place]) 06/11/2010  . Esophageal reflux 06/11/2010  . OSA (obstructive sleep apnea) with BiPap and oxygen 06/11/2010    Goals Addressed   None     Follow-Up:  Pharmacist Review   Called to speak with the patient regarding his Valsartan based on the general adherence list it showed the patient not taking the medication anymore. After speaking with the patient he stated that he stopped the valsartan 6-7 months ago. He stated that he does not like the way it made him feel. He did not speak with Dr. Ronnald Ramp or Dr. Aundra Dubin before stopping the medication.   The patient is scheduled to see Dr. Aundra Dubin on 12/2 I expressed to the patient it is important to inform Dr. Aundra Dubin that he has not been taking his medication as well as the side effects.    Rosendo Gros, [facility]  Practice Team Manager/ CPA (Clinical Pharmacist Assistant) [phone]

## 2020-11-01 ENCOUNTER — Inpatient Hospital Stay (HOSPITAL_COMMUNITY): Admission: RE | Admit: 2020-11-01 | Payer: Medicare HMO | Source: Ambulatory Visit

## 2020-11-14 ENCOUNTER — Telehealth: Payer: Self-pay | Admitting: Internal Medicine

## 2020-11-14 MED ORDER — SPIRONOLACTONE 25 MG PO TABS: 25.0000 mg | ORAL_TABLET | Freq: Every day | ORAL | 0 refills | Status: DC

## 2020-11-14 NOTE — Telephone Encounter (Signed)
OV scheduled for 12.20.2021.

## 2020-11-14 NOTE — Addendum Note (Signed)
Addended by: Hinda Kehr on: 11/14/2020 11:28 AM   Modules accepted: Orders

## 2020-11-14 NOTE — Telephone Encounter (Signed)
Denied.  Pt was due for an OV in October.  Please schedule.

## 2020-11-14 NOTE — Telephone Encounter (Signed)
Noted  

## 2020-11-14 NOTE — Telephone Encounter (Signed)
1.Medication Requested:spironolactone (ALDACTONE) 25 MG tablet    2. Pharmacy (Name, [place], City):[id number] - [place], [place] [phone] [address] [place] [address]  3. On Med List: yes   4. Last Visit with PCP: 7.23.21  5. Next visit date with PCP: n/a   Agent: Please be advised that RX refills may take up to 3 business days. We ask that you follow-up with your pharmacy.

## 2020-11-19 ENCOUNTER — Other Ambulatory Visit: Payer: Self-pay

## 2020-11-19 ENCOUNTER — Ambulatory Visit (INDEPENDENT_AMBULATORY_CARE_PROVIDER_SITE_OTHER): Payer: Medicare HMO | Admitting: Internal Medicine

## 2020-11-19 ENCOUNTER — Encounter: Payer: Self-pay | Admitting: Internal Medicine

## 2020-11-19 VITALS — BP 132/86 | HR 80 | Temp 98.1°F | Ht 69.0 in | Wt 386.0 lb

## 2020-11-19 DIAGNOSIS — E781 Pure hyperglyceridemia: Secondary | ICD-10-CM | POA: Diagnosis not present

## 2020-11-19 DIAGNOSIS — Z0000 Encounter for general adult medical examination without abnormal findings: Secondary | ICD-10-CM | POA: Diagnosis not present

## 2020-11-19 DIAGNOSIS — E519 Thiamine deficiency, unspecified: Secondary | ICD-10-CM

## 2020-11-19 DIAGNOSIS — Z1211 Encounter for screening for malignant neoplasm of colon: Secondary | ICD-10-CM

## 2020-11-19 DIAGNOSIS — E785 Hyperlipidemia, unspecified: Secondary | ICD-10-CM | POA: Diagnosis not present

## 2020-11-19 DIAGNOSIS — E559 Vitamin D deficiency, unspecified: Secondary | ICD-10-CM

## 2020-11-19 DIAGNOSIS — N400 Enlarged prostate without lower urinary tract symptoms: Secondary | ICD-10-CM | POA: Diagnosis not present

## 2020-11-19 DIAGNOSIS — E118 Type 2 diabetes mellitus with unspecified complications: Secondary | ICD-10-CM

## 2020-11-19 DIAGNOSIS — Z0001 Encounter for general adult medical examination with abnormal findings: Secondary | ICD-10-CM

## 2020-11-19 LAB — CBC WITH DIFFERENTIAL/PLATELET
Basophils Absolute: 0 10*3/uL (ref 0.0–0.1)
Basophils Relative: 0.3 % (ref 0.0–3.0)
Eosinophils Absolute: 0.1 10*3/uL (ref 0.0–0.7)
Eosinophils Relative: 0.9 % (ref 0.0–5.0)
HCT: 45.9 % (ref 39.0–52.0)
Hemoglobin: 15.9 g/dL (ref 13.0–17.0)
Lymphocytes Relative: 30.1 % (ref 12.0–46.0)
Lymphs Abs: 3.3 10*3/uL (ref 0.7–4.0)
MCHC: 34.6 g/dL (ref 30.0–36.0)
MCV: 85.1 fl (ref 78.0–100.0)
Monocytes Absolute: 1 10*3/uL (ref 0.1–1.0)
Monocytes Relative: 8.7 % (ref 3.0–12.0)
Neutro Abs: 6.6 10*3/uL (ref 1.4–7.7)
Neutrophils Relative %: 60 % (ref 43.0–77.0)
Platelets: 243 10*3/uL (ref 150.0–400.0)
RBC: 5.39 Mil/uL (ref 4.22–5.81)
RDW: 13.9 % (ref 11.5–15.5)
WBC: 11.1 10*3/uL — ABNORMAL HIGH (ref 4.0–10.5)

## 2020-11-19 LAB — URINALYSIS, ROUTINE W REFLEX MICROSCOPIC
Bilirubin Urine: NEGATIVE
Hgb urine dipstick: NEGATIVE
Leukocytes,Ua: NEGATIVE
Nitrite: NEGATIVE
RBC / HPF: NONE SEEN (ref 0–?)
Specific Gravity, Urine: 1.02 (ref 1.000–1.030)
Total Protein, Urine: NEGATIVE
Urine Glucose: >=1000 — AB
Urobilinogen, UA: >=8 — AB (ref 0.0–1.0)
pH: 6 (ref 5.0–8.0)

## 2020-11-19 LAB — POCT GLYCOSYLATED HEMOGLOBIN (HGB A1C): Hemoglobin A1C: 6.2 % — AB (ref 4.0–5.6)

## 2020-11-19 LAB — PSA: PSA: 0.68 ng/mL (ref 0.10–4.00)

## 2020-11-19 NOTE — Patient Instructions (Signed)

## 2020-11-19 NOTE — Progress Notes (Signed)
Subjective:  Patient ID: [name], male    DOB: 1966-09-19  Age: 54 y.o. MRN: [id number]  CC: Annual Exam, Congestive Heart Failure, Hypertension, Hyperlipidemia, and Gynecologic Exam  This visit occurred during the SARS-CoV-2 public health emergency.  Safety protocols were in place, including screening questions prior to the visit, additional usage of staff PPE, and extensive cleaning of exam room while observing appropriate contact time as indicated for disinfecting solutions.    HPI [name] presents for a CPX.  He tells me that 2 months ago he was admitted to a hospital in [facility] for the evaluation of chest pain.  He says his work-up was negative.  He has been working on his lifestyle modification has been able to lose weight.  He tells me his blood sugar and blood pressure have been well controlled.  He denies polys.  He denies CP, DOE, palpitations, or edema.  Outpatient Medications Prior to Visit  Medication Sig Dispense Refill  . ARIPiprazole (ABILIFY) 10 MG tablet Take 1 tablet (10 mg total) by mouth daily. 90 tablet 0  . blood glucose meter kit and supplies KIT Use to check blood sugar. DX E11.9 1 each 0  . clonazePAM (KLONOPIN) 1 MG tablet Take 1 tablet (1 mg total) by mouth 2 (two) times daily as needed for anxiety. 60 tablet 1  . FARXIGA 10 MG TABS tablet TAKE 1 TABLET BY MOUTH EVERY DAY 90 tablet 1  . metoprolol succinate (TOPROL-XL) 100 MG 24 hr tablet TAKE 1 TABLET (100 MG TOTAL) BY MOUTH DAILY. TAKE WITH OR IMMEDIATELY FOLLOWING A MEAL. 90 tablet 3  . mupirocin nasal ointment (BACTROBAN) 2 % Place 1 application into the nose 2 (two) times daily. Use one-half of tube in each nostril twice daily for five (5) days. After application, press sides of nose together and gently massage. 10 g 0  . pantoprazole (PROTONIX) 40 MG tablet TAKE 1 TABLET BY MOUTH EVERY DAY 90 tablet 1  . potassium chloride SA (KLOR-CON) 20 MEQ tablet Take 40 mEq by mouth  daily as needed.    Marland Kitchen PRESCRIPTION MEDICATION Inhale into the lungs See admin instructions. Bipap, pressure 16/12 with 2L of O2 - use whenever sleeping    . rivaroxaban (XARELTO) 20 MG TABS tablet Take 1 tablet (20 mg total) by mouth daily. 90 tablet 1  . sertraline (ZOLOFT) 100 MG tablet Take 1 tablet (100 mg total) by mouth daily. 90 tablet 0  . sildenafil (REVATIO) 20 MG tablet Take 4 tablets (80 mg total) by mouth daily as needed. 60 tablet 5  . spironolactone (ALDACTONE) 25 MG tablet Take 1 tablet (25 mg total) by mouth daily. *MUST KEEP 12/20 APPOINTMENT FOR REFILLS* 30 tablet 0  . torsemide (DEMADEX) 20 MG tablet Take 20-30 mg by mouth daily.    . valsartan (DIOVAN) 40 MG tablet Take 0.5 tablets (20 mg total) by mouth 2 (two) times daily. 30 tablet 11  . VASCEPA 1 g capsule TAKE 2 CAPSULES (2 G TOTAL) BY MOUTH 2 (TWO) TIMES DAILY. 360 capsule 1  . atorvastatin (LIPITOR) 40 MG tablet TAKE 1 TABLET BY MOUTH EVERY DAY 90 tablet 1  . sulfamethoxazole-trimethoprim (BACTRIM DS) 800-160 MG tablet Take 1 tablet by mouth 2 (two) times daily. 20 tablet 0  . varenicline (CHANTIX STARTING MONTH PAK) 0.5 MG X 11 & 1 MG X 42 tablet Take one 0.5 mg tablet by mouth once daily for 3 days, then increase  to one 0.5 mg tablet twice daily for 4 days, then increase to one 1 mg tablet twice daily. 53 tablet 0   No facility-administered medications prior to visit.    ROS Review of Systems  Constitutional: Negative for chills, diaphoresis, fatigue and fever.  HENT: Negative.   Eyes: Negative for visual disturbance.  Respiratory: Negative for cough, chest tightness, shortness of breath and wheezing.   Cardiovascular: Negative for chest pain, palpitations and leg swelling.  Gastrointestinal: Negative for abdominal pain, blood in stool, constipation, diarrhea, nausea and vomiting.  Endocrine: Negative.  Negative for polydipsia, polyphagia and polyuria.  Genitourinary: Negative.  Negative for difficulty  urinating, dysuria, penile pain, penile swelling, scrotal swelling, testicular pain and urgency.  Musculoskeletal: Negative for arthralgias, back pain, myalgias and neck pain.  Skin: Negative.  Negative for color change and pallor.  Neurological: Negative.  Negative for dizziness, weakness, light-headedness, numbness and headaches.  Hematological: Negative for adenopathy. Does not bruise/bleed easily.  Psychiatric/Behavioral: Negative.     Objective:  BP 132/86   Pulse 80   Temp 98.1 F (36.7 C) (Oral)   Ht 5' 9"  (1.753 m)   Wt (!) 386 lb (175.1 kg)   SpO2 96%   BMI 57.00 kg/m   BP Readings from Last 3 Encounters:  11/19/20 132/86  06/22/20 120/80  05/15/20 110/80    Wt Readings from Last 3 Encounters:  11/19/20 (!) 386 lb (175.1 kg)  06/22/20 (!) 394 lb (178.7 kg)  05/15/20 (!) 388 lb 12.8 oz (176.4 kg)    Physical Exam Vitals reviewed.  Constitutional:      Appearance: Normal appearance.  HENT:     Nose: Nose normal.     Mouth/Throat:     Mouth: Mucous membranes are moist.  Eyes:     General: No scleral icterus.    Conjunctiva/sclera: Conjunctivae normal.  Cardiovascular:     Rate and Rhythm: Normal rate and regular rhythm.     Heart sounds: No murmur heard.   Pulmonary:     Effort: Pulmonary effort is normal.     Breath sounds: No stridor. No wheezing, rhonchi or rales.  Abdominal:     General: Abdomen is protuberant. Bowel sounds are normal. There is no distension.     Palpations: Abdomen is soft. There is no hepatomegaly, splenomegaly or mass.     Tenderness: There is no abdominal tenderness. There is no guarding.     Hernia: No hernia is present.  Musculoskeletal:        General: Normal range of motion.     Cervical back: Neck supple.     Right lower leg: No edema.     Left lower leg: No edema.  Skin:    General: Skin is warm and dry.     Coloration: Skin is not pale.  Neurological:     General: No focal deficit present.     Mental Status: He is  alert and oriented to person, place, and time. Mental status is at baseline.  Psychiatric:        Mood and Affect: Mood normal.        Behavior: Behavior normal.        Thought Content: Thought content normal.        Judgment: Judgment normal.     Lab Results  Component Value Date   WBC 11.1 (H) 11/19/2020   HGB 15.9 11/19/2020   HCT 45.9 11/19/2020   PLT 243.0 11/19/2020   GLUCOSE 71 11/19/2020   CHOL 226 (  H) 11/19/2020   TRIG 133.0 11/19/2020   HDL 35.00 (L) 11/19/2020   LDLDIRECT 114.0 03/21/2020   LDLCALC 164 (H) 11/19/2020   ALT 28 11/19/2020   AST 19 11/19/2020   NA 138 11/19/2020   K 3.5 11/19/2020   CL 104 11/19/2020   CREATININE 1.08 11/19/2020   BUN 14 11/19/2020   CO2 24 11/19/2020   TSH 1.27 03/21/2020   PSA 0.68 11/19/2020   INR 1.1 (H) 03/21/2020   HGBA1C 6.2 (A) 11/19/2020   MICROALBUR 2.1 (H) 11/19/2020    No results found.  Assessment & Plan:   Jacobi was seen today for annual exam, congestive heart failure, hypertension, hyperlipidemia and gynecologic exam.  Diagnoses and all orders for this visit:  Colon cancer screening -     Cologuard  Type II diabetes mellitus with manifestations ([place])- His A1c is at 6.2%.  His blood sugar is adequately well controlled.  He was praised for his lifestyle modifications. -     POCT glycosylated hemoglobin (Hb A1C) -     Basic metabolic panel; Future -     Urinalysis, Routine w reflex microscopic; Future -     Microalbumin / creatinine urine ratio; Future -     HM Diabetes Foot Exam -     Ambulatory referral to Ophthalmology -     Microalbumin / creatinine urine ratio -     Urinalysis, Routine w reflex microscopic -     Basic metabolic panel  Encounter for general adult medical examination with abnormal findings- Exam completed, labs reviewed, vaccines reviewed - He refused vaccines against pneumonia and influenza, cancer screenings addressed, patient education material was given.  Benign prostatic  hyperplasia without lower urinary tract symptoms- His PSA is normal which is a reassuring sign that he does not have prostate cancer. -     PSA; Future -     PSA  Hyperbilirubinemia- His LFTs are normal but his alk phos is mildly elevated.  I have asked him to avoid alcohol. -     Hepatic function panel; Future -     Hepatic function panel  Hyperlipidemia with target LDL less than 100- He has not achieved his LDL goal.  I have asked him to be more compliant with the statin. -     Lipid panel; Future -     Cancel: TSH; Future -     Hepatic function panel; Future -     Hepatic function panel -     Lipid panel -     atorvastatin (LIPITOR) 40 MG tablet; Take 1 tablet (40 mg total) by mouth daily.  Hypertriglyceridemia- Improvement noted with lifestyle modifications. -     Basic metabolic panel; Future -     Lipid panel; Future -     Lipid panel -     Basic metabolic panel  Thiamin deficiency -     CBC with Differential/Platelet; Future -     Vitamin B1; Future -     Vitamin B1 -     CBC with Differential/Platelet -     thiamine 100 MG tablet; Take 1 tablet (100 mg total) by mouth daily.  Vitamin D deficiency disease -     VITAMIN D 25 Hydroxy (Vit-D Deficiency, Fractures); Future -     VITAMIN D 25 Hydroxy (Vit-D Deficiency, Fractures)   I have discontinued Akeem L. Aguado's Chantix Starting [employer] and sulfamethoxazole-trimethoprim. I have also changed his atorvastatin. Additionally, I am having him start on thiamine. Lastly, I  am having him maintain his blood glucose meter kit and supplies, PRESCRIPTION MEDICATION, torsemide, rivaroxaban, sildenafil, potassium chloride SA, valsartan, Farxiga, metoprolol succinate, mupirocin nasal ointment, pantoprazole, Vascepa, ARIPiprazole, sertraline, clonazePAM, and spironolactone.  Meds ordered this encounter  Medications  . atorvastatin (LIPITOR) 40 MG tablet    Sig: Take 1 tablet (40 mg total) by mouth daily.    Dispense:  90 tablet     Refill:  1  . thiamine 100 MG tablet    Sig: Take 1 tablet (100 mg total) by mouth daily.    Dispense:  90 tablet    Refill:  1   In addition to time spent on CPE, I spent 50 minutes in preparing to see the patient by review of recent labs, imaging and procedures, obtaining and reviewing separately obtained history, communicating with the patient and family or caregiver, ordering medications, tests or procedures, and documenting clinical information in the EHR including the differential Dx, treatment, and any further evaluation and other management of 1. Type II diabetes mellitus with manifestations ([place]) 2. Benign prostatic hyperplasia without lower urinary tract symptoms 3. Hyperbilirubinemia 5. Hyperlipidemia with target LDL less than 100 6. Hypertriglyceridemia 7. Thiamin deficiency 8. Vitamin D deficiency disease     Follow-up: Return in about 6 months (around 05/20/2021).  Scarlette Calico, MD

## 2020-11-20 ENCOUNTER — Encounter: Payer: Self-pay | Admitting: Internal Medicine

## 2020-11-20 LAB — LIPID PANEL
Cholesterol: 226 mg/dL — ABNORMAL HIGH (ref 0–200)
HDL: 35 mg/dL — ABNORMAL LOW (ref >39.00)
LDL Cholesterol: 164 mg/dL — ABNORMAL HIGH (ref 0–99)
NonHDL: 190.95
Total CHOL/HDL Ratio: 6
Triglycerides: 133 mg/dL (ref 0.0–149.0)
VLDL: 26.6 mg/dL (ref 0.0–40.0)

## 2020-11-20 LAB — HEPATIC FUNCTION PANEL
ALT: 28 U/L (ref 0–53)
AST: 19 U/L (ref 0–37)
Albumin: 3.9 g/dL (ref 3.5–5.2)
Alkaline Phosphatase: 175 U/L — ABNORMAL HIGH (ref 39–117)
Bilirubin, Direct: 0.3 mg/dL (ref 0.0–0.3)
Total Bilirubin: 1 mg/dL (ref 0.2–1.2)
Total Protein: 7.6 g/dL (ref 6.0–8.3)

## 2020-11-20 LAB — VITAMIN D 25 HYDROXY (VIT D DEFICIENCY, FRACTURES): VITD: 44.92 ng/mL (ref 30.00–100.00)

## 2020-11-20 LAB — BASIC METABOLIC PANEL
BUN: 14 mg/dL (ref 6–23)
CO2: 24 mEq/L (ref 19–32)
Calcium: 9.2 mg/dL (ref 8.4–10.5)
Chloride: 104 mEq/L (ref 96–112)
Creatinine, Ser: 1.08 mg/dL (ref 0.40–1.50)
GFR: 79.1 mL/min (ref >60.00)
Glucose, Bld: 71 mg/dL (ref 70–99)
Potassium: 3.5 mEq/L (ref 3.5–5.1)
Sodium: 138 mEq/L (ref 135–145)

## 2020-11-20 LAB — MICROALBUMIN / CREATININE URINE RATIO
Creatinine,U: 187 mg/dL
Microalb Creat Ratio: 1.1 mg/g (ref 0.0–30.0)
Microalb, Ur: 2.1 mg/dL — ABNORMAL HIGH (ref 0.0–1.9)

## 2020-11-20 MED ORDER — ATORVASTATIN CALCIUM 40 MG PO TABS: 40.0000 mg | ORAL_TABLET | Freq: Every day | ORAL | 1 refills | Status: DC

## 2020-11-22 ENCOUNTER — Other Ambulatory Visit: Payer: Self-pay | Admitting: Internal Medicine

## 2020-11-22 DIAGNOSIS — G4733 Obstructive sleep apnea (adult) (pediatric): Secondary | ICD-10-CM | POA: Diagnosis not present

## 2020-11-22 LAB — VITAMIN B1: Vitamin B1 (Thiamine): <6 nmol/L — ABNORMAL LOW (ref 8–30)

## 2020-11-22 MED ORDER — THIAMINE HCL 100 MG PO TABS: 100.0000 mg | ORAL_TABLET | Freq: Every day | ORAL | 1 refills | Status: DC

## 2020-12-03 ENCOUNTER — Other Ambulatory Visit: Payer: Self-pay

## 2020-12-03 ENCOUNTER — Encounter: Payer: Self-pay | Admitting: Cardiology

## 2020-12-03 ENCOUNTER — Encounter: Payer: Medicare HMO | Admitting: Cardiology

## 2020-12-03 NOTE — Progress Notes (Signed)
This encounter was created in error - please disregard.

## 2020-12-04 NOTE — Progress Notes (Addendum)
Date:  12/07/2020   ID:  [name], DOB [birth year], MRN [id number]   PCP:  [name], MD  Sleep medicine:  Fransico Him, MD (NEW) Electrophysiologist:  None   Chief Complaint:  OSA  History of Present Illness:    [name] is a 55 y.o. 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  He was referred to me to establish sleep care a year ago. He has a hx of severe OSA with a sleep study showing no REM sleep and an AHI of > 70/hr.  He was initially started on CPAP and then changed to BiPAP.  He has had several sleep studies done in [place] and then [place] and he thinks also in [place].   He uses [place].    He is also on 2L of O2 with his BiPAP.    When I saw him in 12/202, his device was > 5 years and his SD card was not working and we ordered a new Resmed BIPAP on auto BiPAP with IPAP max 20cm H2O, EPAP min 6 cm H2O and PS of 5cm H2O.  He was doing great with BiPAP device using it nightly and a few weeks ago he was moving and his device fell out of a box and fell on the concrete and broke and cannot be used. Since then he has really struggled with sleep and is not sleeping well.  He is very fatigued during the day and falling asleep frequently during the day.  He says he cannot go on without another PAP device.  He says prior to breaking the machine he was doing great on his BiPAP and sleeping through the night and feeling rested in the am.  He uses a full face mask which he tolerates well and has no dry mouth or nose.     Prior CV studies:   The following studies were reviewed today: PAP compliance download  Past Medical History:  Diagnosis Date  . Alcohol abuse   . Anxiety state, unspecified   . Atrial fibrillation ([place])   . CHF (congestive heart failure) ([place])   . Chronic systolic heart failure ([place])   . Diabetes mellitus, type II ([place])   . Edema   . Gout   . History of medication noncompliance   .  Migraine   . Obesity, unspecified   . Obstructive sleep apnea   . Psychiatric disorder   . Pulmonary embolism ([place])   . Shortness of breath    Past Surgical History:  Procedure Laterality Date  . CARDIAC CATHETERIZATION    . CARDIAC CATHETERIZATION N/A 08/13/2015   Procedure: Right/Left Heart Cath and Coronary Angiography;  Surgeon: [name], MD;  Location: [facility];  Service: Cardiovascular;  Laterality: N/A;  . RIGHT/LEFT HEART CATH AND CORONARY ANGIOGRAPHY N/A 04/14/2017   Procedure: Right/Left Heart Cath and Coronary Angiography;  Surgeon: [name], MD;  Location: [facility];  Service: Cardiovascular;  Laterality: N/A;  . TESTICLE SURGERY       Current Meds  Medication Sig  . ARIPiprazole (ABILIFY) 10 MG tablet Take 1 tablet (10 mg total) by mouth daily.  Marland Kitchen atorvastatin (LIPITOR) 40 MG tablet Take 1 tablet (40 mg total) by mouth daily.  . blood glucose meter kit and supplies KIT Use to check blood sugar. DX E11.9  . clonazePAM (KLONOPIN) 1 MG tablet  Take 1 tablet (1 mg total) by mouth 2 (two) times daily as needed for anxiety.  Marland Kitchen FARXIGA 10 MG TABS tablet TAKE 1 TABLET BY MOUTH EVERY DAY  . metoprolol succinate (TOPROL-XL) 100 MG 24 hr tablet TAKE 1 TABLET (100 MG TOTAL) BY MOUTH DAILY. TAKE WITH OR IMMEDIATELY FOLLOWING A MEAL.  . pantoprazole (PROTONIX) 40 MG tablet TAKE 1 TABLET BY MOUTH EVERY DAY  . potassium chloride SA (KLOR-CON) 20 MEQ tablet Take 40 mEq by mouth daily as needed.  Marland Kitchen PRESCRIPTION MEDICATION Inhale into the lungs See admin instructions. Bipap, pressure 16/12 with 2L of O2 - use whenever sleeping  . rivaroxaban (XARELTO) 20 MG TABS tablet Take 1 tablet (20 mg total) by mouth daily.  . sertraline (ZOLOFT) 100 MG tablet Take 1 tablet (100 mg total) by mouth daily.  . sildenafil (REVATIO) 20 MG tablet Take 4 tablets (80 mg total) by mouth daily as needed.  Marland Kitchen spironolactone (ALDACTONE) 25 MG tablet Take 1 tablet (25 mg total) by mouth  daily. *MUST KEEP 12/20 APPOINTMENT FOR REFILLS*  . thiamine 100 MG tablet Take 1 tablet (100 mg total) by mouth daily.  Marland Kitchen torsemide (DEMADEX) 20 MG tablet Take 20-30 mg by mouth daily.  . valsartan (DIOVAN) 40 MG tablet Take 0.5 tablets (20 mg total) by mouth 2 (two) times daily.  Marland Kitchen VASCEPA 1 g capsule TAKE 2 CAPSULES (2 G TOTAL) BY MOUTH 2 (TWO) TIMES DAILY.     Allergies:   Ace inhibitors, Bidil [isosorb dinitrate-hydralazine], Digoxin and related, and Buspirone   Social History   Tobacco Use  . Smoking status: Former Smoker    Packs/day: 0.50    Years: 30.00    Pack years: 15.00    Types: Cigarettes    Quit date: 05/15/2020    Years since quitting: 0.5  . Smokeless tobacco: Never Used  . Tobacco comment: vaping - nicotine-free products  Vaping Use  . Vaping Use: Never used  Substance Use Topics  . Alcohol use: Not Currently    Alcohol/week: 0.0 standard drinks  . Drug use: No     Family Hx: The patient's family history includes Cancer in his mother; Diabetes in his daughter, father, and another family member; Heart disease in his maternal grandmother; Hypertension in his mother and another family member; Stroke in an other family member.  ROS:   Please see the history of present illness.     All other systems reviewed and are negative.   Labs/Other Tests and Data Reviewed:    Recent Labs: 03/21/2020: TSH 1.27 11/19/2020: ALT 28; BUN 14; Creatinine, Ser 1.08; Hemoglobin 15.9; Platelets 243.0; Potassium 3.5; Sodium 138   Recent Lipid Panel Lab Results  Component Value Date/Time   CHOL 226 (H) 11/19/2020 02:54 PM   TRIG 133.0 11/19/2020 02:54 PM   HDL 35.00 (L) 11/19/2020 02:54 PM   CHOLHDL 6 11/19/2020 02:54 PM   LDLCALC 164 (H) 11/19/2020 02:54 PM   LDLDIRECT 114.0 03/21/2020 02:55 PM    Wt Readings from Last 3 Encounters:  12/07/20 (!) 379 lb 12.8 oz (172.3 kg)  12/03/20 (!) 387 lb (175.5 kg)  11/19/20 (!) 386 lb (175.1 kg)     Objective:    Vital  Signs:  BP 110/70   Pulse 70   Ht 5' 9"  (1.753 m)   Wt (!) 379 lb 12.8 oz (172.3 kg)   SpO2 96%   BMI 56.09 kg/m    GEN: Well nourished, well developed in no acute distress HEENT:  Normal NECK: No JVD; No carotid bruits LYMPHATICS: No lymphadenopathy CARDIAC:RRR, no murmurs, rubs, gallops RESPIRATORY:  Clear to auscultation without rales, wheezing or rhonchi  ABDOMEN: Soft, non-tender, non-distended MUSCULOSKELETAL:  No edema; No deformity  SKIN: Warm and dry NEUROLOGIC:  Alert and oriented x 3 PSYCHIATRIC:  Normal affect   ASSESSMENT & PLAN:    1.  OSA -  The patient is tolerating PAP therapy well without any problems. The PAP download was reviewed today and showed an AHI of 3.8/hr on auto BiPAP cm H2O with 73% compliance in using more than 4 hours nightly.  The patient has been using and benefiting from PAP use and will continue to benefit from therapy.  -his machine fell on concrete during a move and is completely smashed -he has severe daytime sleepiness and not sleeping at night due to apneas from not have a device -we will set him up for a new BiPAP device on auto and see him back in 8 weeks  2.  HTN -BP controlled -continue Losartan 59m daily, Toprol XL 1035mdaily and spiro 2554maily  3.  Morbid Obesity -his weight is down and he is working on diet and exercise -he has lost 15lbs since November OV in HF clinic  4.  COVID 19 -he has not been vaccinated and I stressed the importance of getting vaccinated immediately due to high risk of getting very sick with his comorbid conditions and possible death  Medication Adjustments/Labs and Tests Ordered: Current medicines are reviewed at length with the patient today.  Concerns regarding medicines are outlined above.  Tests Ordered: No orders of the defined types were placed in this encounter.  Medication Changes: No orders of the defined types were placed in this encounter.   Disposition:  Follow up in 8 week(s) from  start of new PAP device  Signed, TraFransico HimD  12/07/2020 10:38 AM    [place]

## 2020-12-07 ENCOUNTER — Ambulatory Visit (INDEPENDENT_AMBULATORY_CARE_PROVIDER_SITE_OTHER): Payer: Medicare HMO | Admitting: Cardiology

## 2020-12-07 ENCOUNTER — Encounter: Payer: Self-pay | Admitting: Cardiology

## 2020-12-07 ENCOUNTER — Other Ambulatory Visit: Payer: Self-pay

## 2020-12-07 ENCOUNTER — Telehealth: Payer: Self-pay | Admitting: *Deleted

## 2020-12-07 VITALS — BP 110/70 | HR 70 | Ht 69.0 in | Wt 379.8 lb

## 2020-12-07 DIAGNOSIS — I10 Essential (primary) hypertension: Secondary | ICD-10-CM | POA: Diagnosis not present

## 2020-12-07 DIAGNOSIS — G4733 Obstructive sleep apnea (adult) (pediatric): Secondary | ICD-10-CM

## 2020-12-07 NOTE — Telephone Encounter (Signed)
-----   Message from [name], MD sent at 12/07/2020 11:03 AM EST ----- Please order a Resmed BIPAP on auto BiPAP with IPAP max 20cm H2O, EPAP min 6 cm H2O and PS of 5cm H2O. His device fell on concrete out of a box during a house move 3 weeks ago and is completely smashed and cannot use.  He is very symptomatic with his OSA and needs a new device ASAP.  He has been compliant with his device up until 3 weeks ago when it broke

## 2020-12-07 NOTE — Telephone Encounter (Signed)
Order placed to [facility]. 

## 2020-12-07 NOTE — Patient Instructions (Signed)
Medication Instructions:  Your physician recommends that you continue on your current medications as directed. Please refer to the Current Medication list given to you today.  *If you need a refill on your cardiac medications before your next appointment, please call your pharmacy*  Follow-Up: At [facility], you and your health needs are our priority.  As part of our continuing mission to provide you with exceptional heart care, we have created designated Provider Care Teams.  These Care Teams include your primary Cardiologist (physician) and Advanced Practice Providers (APPs -  Physician Assistants and Nurse Practitioners) who all work together to provide you with the care you need, when you need it.  Follow up with Dr. Radford Pax after receiving new device.

## 2020-12-10 ENCOUNTER — Other Ambulatory Visit: Payer: Self-pay | Admitting: Internal Medicine

## 2020-12-11 ENCOUNTER — Telehealth: Payer: Self-pay | Admitting: Pharmacist

## 2020-12-11 NOTE — Progress Notes (Signed)
Chronic Care Management Pharmacy Assistant   Name: LUISDANIEL KENTON  MRN: [id number] DOB: 08-22-1966  Reason for Encounter: General Adherence Call   PCP : [name], MD  Allergies:   Allergies  Allergen Reactions   Ace Inhibitors Anaphylaxis and Swelling    Angioedema   Bidil [Isosorb Dinitrate-Hydralazine] Other (See Comments)    headache   Digoxin And Related     Unspecified "side effects"   Buspirone Other (See Comments)    dizziness    Medications: Outpatient Encounter Medications as of 12/11/2020  Medication Sig   ARIPiprazole (ABILIFY) 10 MG tablet Take 1 tablet (10 mg total) by mouth daily.   atorvastatin (LIPITOR) 40 MG tablet Take 1 tablet (40 mg total) by mouth daily.   blood glucose meter kit and supplies KIT Use to check blood sugar. DX E11.9   clonazePAM (KLONOPIN) 1 MG tablet Take 1 tablet (1 mg total) by mouth 2 (two) times daily as needed for anxiety.   FARXIGA 10 MG TABS tablet TAKE 1 TABLET BY MOUTH EVERY DAY   metoprolol succinate (TOPROL-XL) 100 MG 24 hr tablet TAKE 1 TABLET (100 MG TOTAL) BY MOUTH DAILY. TAKE WITH OR IMMEDIATELY FOLLOWING A MEAL.   pantoprazole (PROTONIX) 40 MG tablet TAKE 1 TABLET BY MOUTH EVERY DAY   potassium chloride SA (KLOR-CON) 20 MEQ tablet Take 40 mEq by mouth daily as needed.   PRESCRIPTION MEDICATION Inhale into the lungs See admin instructions. Bipap, pressure 16/12 with 2L of O2 - use whenever sleeping   rivaroxaban (XARELTO) 20 MG TABS tablet Take 1 tablet (20 mg total) by mouth daily.   sertraline (ZOLOFT) 100 MG tablet Take 1 tablet (100 mg total) by mouth daily.   sildenafil (REVATIO) 20 MG tablet Take 4 tablets (80 mg total) by mouth daily as needed.   spironolactone (ALDACTONE) 25 MG tablet TAKE 1 TABLET (25 MG TOTAL) BY MOUTH DAILY. *MUST KEEP 12/20 APPOINTMENT FOR REFILLS*   thiamine 100 MG tablet Take 1 tablet (100 mg total) by mouth daily.   torsemide (DEMADEX) 20 MG tablet Take 20-30 mg by  mouth daily.   valsartan (DIOVAN) 40 MG tablet Take 0.5 tablets (20 mg total) by mouth 2 (two) times daily.   VASCEPA 1 g capsule TAKE 2 CAPSULES (2 G TOTAL) BY MOUTH 2 (TWO) TIMES DAILY.   [DISCONTINUED] pravastatin (PRAVACHOL) 40 MG tablet Take 40 mg by mouth daily.   No facility-administered encounter medications on file as of 12/11/2020.    Current Diagnosis: Patient Active Problem List   Diagnosis Date Noted   Colon cancer screening 11/19/2020   Bipolar 1 disorder, depressed, partial remission ([place]) 06/27/2020   Bipolar I disorder, most recent episode depressed ([place]) 03/05/2020   Encounter for screening for HIV 01/05/2020   Hyperlipidemia with target LDL less than 100 10/12/2019   Screen for colon cancer 10/12/2019   Hidradenitis suppurativa of left axilla 10/12/2019   Benign prostatic hyperplasia without lower urinary tract symptoms 10/12/2019   Claudication of both lower extremities ([place]) 04/20/2019   Drug-induced erectile dysfunction 02/28/2019   Vitamin D deficiency disease 01/20/2019   Severe episode of recurrent major depressive disorder, with psychotic features ([place]) 12/16/2018   Bipolar 1 disorder, depressed ([place]) 12/16/2018   Osteoarthritis of both ankles and feet 11/30/2018   Diabetic neuropathy, painful ([place]) 11/30/2018   Fatty liver disease, nonalcoholic 32/44/0102   Thiamin deficiency 04/29/2018   Cholelithiasis 04/22/2018   Hyperbilirubinemia    PE (pulmonary thromboembolism) ([place]) 04/12/2017  Panic disorder 01/15/2017   Hereditary and idiopathic peripheral neuropathy 07/16/2016   AF (paroxysmal atrial fibrillation) (HCC)    Osteoarthritis of both knees 12/27/2014   NSVT (nonsustained ventricular tachycardia) ([place]) 09/26/2013   Alcohol abuse 08/10/2013   Hypertriglyceridemia 06/29/2013   Nonischemic cardiomyopathy ([place]) 01/26/2013   Type II diabetes mellitus with manifestations ([place]) 04/13/2012   Tobacco user 02/18/2012    ED (erectile dysfunction) 07/11/2011   Morbid obesity ([place]) 06/11/2010   Essential hypertension 06/11/2010   Chronic combined systolic and diastolic CHF (congestive heart failure) ([place]) 06/11/2010   Esophageal reflux 06/11/2010   OSA (obstructive sleep apnea) with BiPap and oxygen 06/11/2010    Goals Addressed   None     Follow-Up:  Pharmacist Review   A general Adherence wellness call was made to Mr. Horstman to ask how he has been doing since he last spoke with the clinical pharmacist Mendel Ryder. The patient stated that he has been tired lately due to the fact that he has not had his bi-pap machine in over a month. The patient states that he is taking all of his medications as directed. He has not had any problems with his blood pressure or taking his blood sugar. Over all the patient states that he has not had any new health issues or changes. I let him know that I will pass along the information to the clinical pharmacist Mendel Ryder.   Wendy Poet, [place] [phone]

## 2020-12-13 ENCOUNTER — Other Ambulatory Visit: Payer: Self-pay

## 2020-12-13 ENCOUNTER — Encounter (HOSPITAL_COMMUNITY): Payer: Self-pay | Admitting: Psychiatry

## 2020-12-13 ENCOUNTER — Telehealth (INDEPENDENT_AMBULATORY_CARE_PROVIDER_SITE_OTHER): Payer: Medicare HMO | Admitting: Psychiatry

## 2020-12-13 DIAGNOSIS — F900 Attention-deficit hyperactivity disorder, predominantly inattentive type: Secondary | ICD-10-CM | POA: Diagnosis not present

## 2020-12-13 DIAGNOSIS — R69 Illness, unspecified: Secondary | ICD-10-CM | POA: Diagnosis not present

## 2020-12-13 DIAGNOSIS — F3175 Bipolar disorder, in partial remission, most recent episode depressed: Secondary | ICD-10-CM | POA: Diagnosis not present

## 2020-12-13 DIAGNOSIS — F410 Panic disorder [episodic paroxysmal anxiety] without agoraphobia: Secondary | ICD-10-CM

## 2020-12-13 DIAGNOSIS — Z630 Problems in relationship with spouse or partner: Secondary | ICD-10-CM | POA: Diagnosis not present

## 2020-12-13 MED ORDER — CLONAZEPAM 1 MG PO TABS
1.0000 mg | ORAL_TABLET | Freq: Two times a day (BID) | ORAL | 1 refills | Status: DC | PRN
Start: 2020-12-13 — End: 2021-01-24

## 2020-12-13 MED ORDER — ARIPIPRAZOLE 10 MG PO TABS: 10.0000 mg | ORAL_TABLET | Freq: Every day | ORAL | 0 refills | Status: DC

## 2020-12-13 MED ORDER — ATOMOXETINE HCL 40 MG PO CAPS: 40.0000 mg | ORAL_CAPSULE | Freq: Every day | ORAL | 1 refills | Status: DC

## 2020-12-13 MED ORDER — SERTRALINE HCL 100 MG PO TABS: 100.0000 mg | ORAL_TABLET | Freq: Every day | ORAL | 0 refills | Status: DC
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
## 2020-12-18 DIAGNOSIS — G4733 Obstructive sleep apnea (adult) (pediatric): Secondary | ICD-10-CM | POA: Diagnosis not present

## 2020-12-18 DIAGNOSIS — I5042 Chronic combined systolic (congestive) and diastolic (congestive) heart failure: Secondary | ICD-10-CM | POA: Diagnosis not present

## 2020-12-18 DIAGNOSIS — I2699 Other pulmonary embolism without acute cor pulmonale: Secondary | ICD-10-CM | POA: Diagnosis not present

## 2020-12-23 DIAGNOSIS — G4733 Obstructive sleep apnea (adult) (pediatric): Secondary | ICD-10-CM | POA: Diagnosis not present

## 2021-01-05 ENCOUNTER — Other Ambulatory Visit (HOSPITAL_COMMUNITY): Payer: Self-pay | Admitting: Psychiatry

## 2021-01-05 DIAGNOSIS — F900 Attention-deficit hyperactivity disorder, predominantly inattentive type: Secondary | ICD-10-CM

## 2021-01-14 ENCOUNTER — Telehealth: Payer: Self-pay

## 2021-01-14 NOTE — Telephone Encounter (Signed)
LVM instructing pt to call office re: unreturned cologuard kit. Will send mychart message.

## 2021-01-15 ENCOUNTER — Ambulatory Visit (INDEPENDENT_AMBULATORY_CARE_PROVIDER_SITE_OTHER): Payer: Medicare HMO | Admitting: Pharmacist

## 2021-01-15 ENCOUNTER — Ambulatory Visit (INDEPENDENT_AMBULATORY_CARE_PROVIDER_SITE_OTHER): Payer: Medicare HMO

## 2021-01-15 ENCOUNTER — Other Ambulatory Visit: Payer: Self-pay

## 2021-01-15 DIAGNOSIS — I480 Paroxysmal atrial fibrillation: Secondary | ICD-10-CM | POA: Diagnosis not present

## 2021-01-15 DIAGNOSIS — I5042 Chronic combined systolic (congestive) and diastolic (congestive) heart failure: Secondary | ICD-10-CM | POA: Diagnosis not present

## 2021-01-15 DIAGNOSIS — E118 Type 2 diabetes mellitus with unspecified complications: Secondary | ICD-10-CM

## 2021-01-15 DIAGNOSIS — F319 Bipolar disorder, unspecified: Secondary | ICD-10-CM

## 2021-01-15 DIAGNOSIS — I10 Essential (primary) hypertension: Secondary | ICD-10-CM | POA: Diagnosis not present

## 2021-01-15 DIAGNOSIS — E785 Hyperlipidemia, unspecified: Secondary | ICD-10-CM | POA: Diagnosis not present

## 2021-01-15 DIAGNOSIS — Z0000 Encounter for general adult medical examination without abnormal findings: Secondary | ICD-10-CM

## 2021-01-15 NOTE — Patient Instructions (Signed)
[name] , Thank you for taking time to come for your Medicare Wellness Visit. I appreciate your ongoing commitment to your health goals. Please review the following plan we discussed and let me know if I can assist you in the future.   Screening recommendations/referrals: Colonoscopy: never done; just received Cologuard in mail 01/14/2021 Recommended yearly ophthalmology/optometry visit for glaucoma screening and checkup Recommended yearly dental visit for hygiene and checkup  Vaccinations: Influenza vaccine: declined Pneumococcal vaccine: declined Tdap vaccine: 10/20/2011; due every 10 years (2022) Shingles vaccine: declined   Covid-19: declined  Advanced directives: Advance directive discussed with you today. Even though you declined this today please call our office should you change your mind and we can give you the proper paperwork for you to fill out.  Conditions/risks identified: Yes; Reviewed health maintenance screenings with patient today and relevant education, vaccines, and/or referrals were provided. Please continue to do your personal lifestyle choices by: daily care of teeth and gums, regular physical activity (goal should be 5 days a week for 30 minutes), eat a healthy diet, avoid tobacco and drug use, limiting any alcohol intake, taking a low-dose aspirin (if not allergic or have been advised by your provider otherwise) and taking vitamins and minerals as recommended by your provider. Continue doing brain stimulating activities (puzzles, reading, adult coloring books, staying active) to keep memory sharp. Continue to eat heart healthy diet (full of fruits, vegetables, whole grains, lean protein, water--limit salt, fat, and sugar intake) and increase physical activity as tolerated.  Next appointment: Please schedule your next Medicare Wellness Visit with your Nurse Health Advisor in 1 year by calling [phone].  Preventive Care 40-64 Years, Male Preventive care refers to  lifestyle choices and visits with your health care provider that can promote health and wellness. What does preventive care include?  A yearly physical exam. This is also called an annual well check.  Dental exams once or twice a year.  Routine eye exams. Ask your health care provider how often you should have your eyes checked.  Personal lifestyle choices, including:  Daily care of your teeth and gums.  Regular physical activity.  Eating a healthy diet.  Avoiding tobacco and drug use.  Limiting alcohol use.  Practicing safe sex.  Taking low-dose aspirin every day starting at age 16. What happens during an annual well check? The services and screenings done by your health care provider during your annual well check will depend on your age, overall health, lifestyle risk factors, and family history of disease. Counseling  Your health care provider may ask you questions about your:  Alcohol use.  Tobacco use.  Drug use.  Emotional well-being.  Home and relationship well-being.  Sexual activity.  Eating habits.  Work and work Statistician. Screening  You may have the following tests or measurements:  Height, weight, and BMI.  Blood pressure.  Lipid and cholesterol levels. These may be checked every 5 years, or more frequently if you are over 66 years old.  Skin check.  Lung cancer screening. You may have this screening every year starting at age 50 if you have a 30-pack-year history of smoking and currently smoke or have quit within the past 15 years.  Fecal occult blood test (FOBT) of the stool. You may have this test every year starting at age 10.  Flexible sigmoidoscopy or colonoscopy. You may have a sigmoidoscopy every 5 years or a colonoscopy every 10 years starting at age 17.  Prostate cancer screening. Recommendations will vary  depending on your family history and other risks.  Hepatitis C blood test.  Hepatitis B blood test.  Sexually transmitted  disease (STD) testing.  Diabetes screening. This is done by checking your blood sugar (glucose) after you have not eaten for a while (fasting). You may have this done every 1-3 years. Discuss your test results, treatment options, and if necessary, the need for more tests with your health care provider. Vaccines  Your health care provider may recommend certain vaccines, such as:  Influenza vaccine. This is recommended every year.  Tetanus, diphtheria, and acellular pertussis (Tdap, Td) vaccine. You may need a Td booster every 10 years.  Zoster vaccine. You may need this after age 81.  Pneumococcal 13-valent conjugate (PCV13) vaccine. You may need this if you have certain conditions and have not been vaccinated.  Pneumococcal polysaccharide (PPSV23) vaccine. You may need one or two doses if you smoke cigarettes or if you have certain conditions. Talk to your health care provider about which screenings and vaccines you need and how often you need them. This information is not intended to replace advice given to you by your health care provider. Make sure you discuss any questions you have with your health care provider. Document Released: 12/14/2015 Document Revised: 08/06/2016 Document Reviewed: 09/18/2015 Elsevier Interactive Patient Education  2017 [place] Prevention in the Home Falls can cause injuries. They can happen to people of all ages. There are many things you can do to make your home safe and to help prevent falls. What can I do on the outside of my home?  Regularly fix the edges of walkways and driveways and fix any cracks.  Remove anything that might make you trip as you walk through a door, such as a raised step or threshold.  Trim any bushes or trees on the path to your home.  Use bright outdoor lighting.  Clear any walking paths of anything that might make someone trip, such as rocks or tools.  Regularly check to see if handrails are loose or broken. Make  sure that both sides of any steps have handrails.  Any raised decks and porches should have guardrails on the edges.  Have any leaves, snow, or ice cleared regularly.  Use sand or salt on walking paths during winter.  Clean up any spills in your garage right away. This includes oil or grease spills. What can I do in the bathroom?  Use night lights.  Install grab bars by the toilet and in the tub and shower. Do not use towel bars as grab bars.  Use non-skid mats or decals in the tub or shower.  If you need to sit down in the shower, use a plastic, non-slip stool.  Keep the floor dry. Clean up any water that spills on the floor as soon as it happens.  Remove soap buildup in the tub or shower regularly.  Attach bath mats securely with double-sided non-slip rug tape.  Do not have throw rugs and other things on the floor that can make you trip. What can I do in the bedroom?  Use night lights.  Make sure that you have a light by your bed that is easy to reach.  Do not use any sheets or blankets that are too big for your bed. They should not hang down onto the floor.  Have a firm chair that has side arms. You can use this for support while you get dressed.  Do not have throw rugs and  other things on the floor that can make you trip. What can I do in the kitchen?  Clean up any spills right away.  Avoid walking on wet floors.  Keep items that you use a lot in easy-to-reach places.  If you need to reach something above you, use a strong step stool that has a grab bar.  Keep electrical cords out of the way.  Do not use floor polish or wax that makes floors slippery. If you must use wax, use non-skid floor wax.  Do not have throw rugs and other things on the floor that can make you trip. What can I do with my stairs?  Do not leave any items on the stairs.  Make sure that there are handrails on both sides of the stairs and use them. Fix handrails that are broken or loose.  Make sure that handrails are as long as the stairways.  Check any carpeting to make sure that it is firmly attached to the stairs. Fix any carpet that is loose or worn.  Avoid having throw rugs at the top or bottom of the stairs. If you do have throw rugs, attach them to the floor with carpet tape.  Make sure that you have a light switch at the top of the stairs and the bottom of the stairs. If you do not have them, ask someone to add them for you. What else can I do to help prevent falls?  Wear shoes that:  Do not have high heels.  Have rubber bottoms.  Are comfortable and fit you well.  Are closed at the toe. Do not wear sandals.  If you use a stepladder:  Make sure that it is fully opened. Do not climb a closed stepladder.  Make sure that both sides of the stepladder are locked into place.  Ask someone to hold it for you, if possible.  Clearly mark and make sure that you can see:  Any grab bars or handrails.  First and last steps.  Where the edge of each step is.  Use tools that help you move around (mobility aids) if they are needed. These include:  Canes.  Walkers.  Scooters.  Crutches.  Turn on the lights when you go into a dark area. Replace any light bulbs as soon as they burn out.  Set up your furniture so you have a clear path. Avoid moving your furniture around.  If any of your floors are uneven, fix them.  If there are any pets around you, be aware of where they are.  Review your medicines with your doctor. Some medicines can make you feel dizzy. This can increase your chance of falling. Ask your doctor what other things that you can do to help prevent falls. This information is not intended to replace advice given to you by your health care provider. Make sure you discuss any questions you have with your health care provider. Document Released: 09/13/2009 Document Revised: 04/24/2016 Document Reviewed: 12/22/2014 Elsevier Interactive Patient  Education  2017 [employer].

## 2021-01-15 NOTE — Progress Notes (Signed)
I connected with [name] today by telephone and verified that I am speaking with the correct person using two identifiers. Location patient: home Location provider: work Persons participating in the virtual visit: Sedric Guia and Lisette Abu, LPN.   I discussed the limitations, risks, security and privacy concerns of performing an evaluation and management service by telephone and the availability of in person appointments. I also discussed with the patient that there may be a patient responsible charge related to this service. The patient expressed understanding and verbally consented to this telephonic visit.    Interactive audio and video telecommunications were attempted between this provider and patient, however failed, due to patient having technical difficulties OR patient did not have access to video capability.  We continued and completed visit with audio only.  Some vital signs may be absent or patient reported.   Time Spent with patient on telephone encounter: 30 minutes  Subjective:   [name] is a 55 y.o. male who presents for Medicare Annual/Subsequent preventive examination.  Review of Systems    No ROS. Medicare Wellness Visit. Additional risk factors are reflected in social history. Cardiac Risk Factors include: dyslipidemia;family history of premature cardiovascular disease;hypertension;male gender;obesity (BMI >30kg/m2)     Objective:    There were no vitals filed for this visit. There is no height or weight on file to calculate BMI.  Advanced Directives 01/15/2021 12/19/2019 11/20/2018 04/12/2017 04/12/2017 09/04/2016 02/12/2016  Does Patient Have a Medical Advance Directive? No No No No No No No  Would patient like information on creating a medical advance directive? No - Patient declined No - Patient declined No - Patient declined No - Patient declined - - No - patient declined information  Some encounter information is confidential and restricted.  Go to Review Flowsheets activity to see all data.    Current Medications (verified) Outpatient Encounter Medications as of 01/15/2021  Medication Sig  . ARIPiprazole (ABILIFY) 10 MG tablet Take 1 tablet (10 mg total) by mouth daily.  Marland Kitchen atomoxetine (STRATTERA) 40 MG capsule TAKE 1 CAPSULE (40 MG TOTAL) BY MOUTH DAILY.  Marland Kitchen atorvastatin (LIPITOR) 40 MG tablet Take 1 tablet (40 mg total) by mouth daily.  . blood glucose meter kit and supplies KIT Use to check blood sugar. DX E11.9  . clonazePAM (KLONOPIN) 1 MG tablet Take 1 tablet (1 mg total) by mouth 2 (two) times daily as needed for anxiety.  Marland Kitchen FARXIGA 10 MG TABS tablet TAKE 1 TABLET BY MOUTH EVERY DAY  . metoprolol succinate (TOPROL-XL) 100 MG 24 hr tablet TAKE 1 TABLET (100 MG TOTAL) BY MOUTH DAILY. TAKE WITH OR IMMEDIATELY FOLLOWING A MEAL.  . pantoprazole (PROTONIX) 40 MG tablet TAKE 1 TABLET BY MOUTH EVERY DAY  . potassium chloride SA (KLOR-CON) 20 MEQ tablet Take 40 mEq by mouth daily as needed.  Marland Kitchen PRESCRIPTION MEDICATION Inhale into the lungs See admin instructions. Bipap, pressure 16/12 with 2L of O2 - use whenever sleeping  . rivaroxaban (XARELTO) 20 MG TABS tablet Take 1 tablet (20 mg total) by mouth daily.  . sertraline (ZOLOFT) 100 MG tablet Take 1 tablet (100 mg total) by mouth daily.  . sildenafil (REVATIO) 20 MG tablet Take 4 tablets (80 mg total) by mouth daily as needed.  Marland Kitchen spironolactone (ALDACTONE) 25 MG tablet TAKE 1 TABLET (25 MG TOTAL) BY MOUTH DAILY. *MUST KEEP 12/20 APPOINTMENT FOR REFILLS*  . thiamine 100 MG tablet Take 1 tablet (100 mg total) by mouth daily.  Marland Kitchen torsemide (  DEMADEX) 20 MG tablet Take 20-30 mg by mouth daily.  . valsartan (DIOVAN) 40 MG tablet Take 0.5 tablets (20 mg total) by mouth 2 (two) times daily.  Marland Kitchen VASCEPA 1 g capsule TAKE 2 CAPSULES (2 G TOTAL) BY MOUTH 2 (TWO) TIMES DAILY.  . [DISCONTINUED] pravastatin (PRAVACHOL) 40 MG tablet Take 40 mg by mouth daily.   No facility-administered encounter  medications on file as of 01/15/2021.    Allergies (verified) Ace inhibitors, Bidil [isosorb dinitrate-hydralazine], Digoxin and related, and Buspirone   History: Past Medical History:  Diagnosis Date  . Alcohol abuse   . Anxiety state, unspecified   . Atrial fibrillation ([place])   . CHF (congestive heart failure) ([place])   . Chronic systolic heart failure ([place])   . Diabetes mellitus, type II ([place])   . Edema   . Gout   . History of medication noncompliance   . Migraine   . Obesity, unspecified   . Obstructive sleep apnea   . Psychiatric disorder   . Pulmonary embolism ([place])   . Shortness of breath    Past Surgical History:  Procedure Laterality Date  . CARDIAC CATHETERIZATION    . CARDIAC CATHETERIZATION N/A 08/13/2015   Procedure: Right/Left Heart Cath and Coronary Angiography;  Surgeon: [name], MD;  Location: [facility];  Service: Cardiovascular;  Laterality: N/A;  . RIGHT/LEFT HEART CATH AND CORONARY ANGIOGRAPHY N/A 04/14/2017   Procedure: Right/Left Heart Cath and Coronary Angiography;  Surgeon: [name], MD;  Location: [facility];  Service: Cardiovascular;  Laterality: N/A;  . TESTICLE SURGERY     Family History  Problem Relation Age of Onset  . Cancer Mother        brain tumor  . Hypertension Mother   . Diabetes Father        Deceased, 31  . Heart disease Maternal Grandmother   . Hypertension Other        Family History  . Stroke Other        Family History  . Diabetes Other        Family History  . Diabetes Daughter    Social History   Socioeconomic History  . Marital status: Married    Spouse name: Not on file  . Number of children: 3  . Years of education: Not on file  . Highest education level: Not on file  Occupational History  . Occupation: DISABLED  Tobacco Use  . Smoking status: Former Smoker    Packs/day: 0.50    Years: 30.00    Pack years: 15.00    Types: Cigarettes    Quit date: 05/15/2020    Years since  quitting: 0.6  . Smokeless tobacco: Never Used  . Tobacco comment: vaping - nicotine-free products  Vaping Use  . Vaping Use: Never used  Substance and Sexual Activity  . Alcohol use: Not Currently    Alcohol/week: 0.0 standard drinks  . Drug use: No  . Sexual activity: Not Currently  Other Topics Concern  . Not on file  Social History Narrative   He smokes about a pack per day and he has been smoking since he was 55 years of age.  He drinks alcohol occasionally, but he denies any illicit drug abuse.  He is presently on disability.    Lives with wife in a 2 story home.  Has 2 children.   Previously worked in [employer], last worked in 1998.   Highest level of education:  11th grade  Social Determinants of Health   Financial Resource Strain: Low Risk   . Difficulty of Paying Living Expenses: Not hard at all  Food Insecurity: No Food Insecurity  . Worried About Charity fundraiser in the Last Year: Never true  . Ran Out of Food in the Last Year: Never true  Transportation Needs: No Transportation Needs  . Lack of Transportation (Medical): No  . Lack of Transportation (Non-Medical): No  Physical Activity: Sufficiently Active  . Days of Exercise per Week: 5 days  . Minutes of Exercise per Session: 30 min  Stress: No Stress Concern Present  . Feeling of Stress : Not at all  Social Connections: Moderately Isolated  . Frequency of Communication with Friends and Family: More than three times a week  . Frequency of Social Gatherings with Friends and Family: Never  . Attends Religious Services: Never  . Active Member of Clubs or Organizations: No  . Attends Archivist Meetings: Never  . Marital Status: Married    Tobacco Counseling Counseling given: Not Answered Comment: vaping - nicotine-free products   Clinical Intake:  Pre-visit preparation completed: Yes  Pain : No/denies pain     Nutritional Risks: None Diabetes: No  How often do you need to  have someone help you when you read instructions, pamphlets, or other written materials from your doctor or pharmacy?: 1 - Never What is the last grade level you completed in school?: 11th grade  Diabetic? No   Interpreter Needed?: No  Information entered by :: Lisette Abu, LPN   Activities of Daily Living In your present state of health, do you have any difficulty performing the following activities: 01/15/2021  Hearing? N  Vision? N  Difficulty concentrating or making decisions? N  Walking or climbing stairs? N  Dressing or bathing? N  Doing errands, shopping? N  Preparing Food and eating ? N  Using the Toilet? N  In the past six months, have you accidently leaked urine? N  Do you have problems with loss of bowel control? N  Managing your Medications? N  Managing your Finances? N  Housekeeping or managing your Housekeeping? N  Some encounter information is confidential and restricted. Go to Review Flowsheets activity to see all data.  Some recent data might be hidden    Patient Care Team: [name], MD as PCP - General (Internal Medicine) [name], MD as PCP - Cardiology (Cardiology) [name], MD as PCP - Advanced Heart Failure (Cardiology) [name], MD as Consulting Physician (Cardiology) [name], [facility] as Pharmacist (Pharmacist)  Indicate any recent Medical Services you may have received from other than [facility] providers in the past year (date may be approximate).     Assessment:   This is a routine wellness examination for [place].  Hearing/Vision screen No exam data present  Dietary issues and exercise activities discussed: Current Exercise Habits: Home exercise routine;Structured exercise class, Type of exercise: treadmill;walking;Other - see comments (2 mile trail with wife; going to work out at [employer]), Time (Minutes): 30, Frequency (Times/Week): 5, Weekly Exercise (Minutes/Week): 150, Intensity: Moderate, Exercise  limited by: respiratory conditions(s);orthopedic condition(s);cardiac condition(s)  Goals    . Limit fluid intake to 2 liters per day     Continue to work on my weight loss and lose 100 pounds.    . Pharmacy Care Plan     CARE PLAN ENTRY (see longitudinal plan of care for additional care plan information)  Current Barriers:  .  Chronic Disease Management support, education, and care coordination needs related to Hypertension, Hyperlipidemia, Diabetes, Atrial Fibrillation, and Heart Failure   Hypertension / Heart Failure BP Readings from Last 3 Encounters:  06/22/20 120/80  05/15/20 110/80  04/12/20 (!) 93/58 .  Pharmacist Clinical Goal(s): o Over the next 30 days, patient will work with PharmD and providers to maintain BP goal <130/80 . Current regimen:  o Metoprolol succinate 100 mg daily o Spironolactone 25 mg daily o Torsemide 20-30 mg daily as needed o Valsartan 40 mg - 1/2 tab twice a day (not started) o Farxiga 10 mg daily o Potassium chloride 20 mEq - 2 tab daily as needed . Interventions: o Discussed benefits of beta blockers, valsartan, spironolactone, and Farxiga for reducing risk for heart failure hospitalizations and prolonging life o Discussed side effects of metoprolol and potential benefits to moving dose to evening o Recommend daily use of torsemide to maintain fluid status o Recommend discussing optimal metoprolol dose with cardiologist given side effects of fatigue . Patient self care activities - Over the next 30 days, patient will: o Check BP daily, document, and provide at future appointments o Ensure daily salt intake < 2300 mg/day o Move metoprolol to evening o Reschedule appt with [name] to discuss medications/side effects  Hyperlipidemia Lab Results  Component Value Date/Time   LDLCALC 128 (H) 12/07/2018 04:14 PM   LDLDIRECT 114.0 03/21/2020 02:55 PM   TRIG (H) 03/21/2020 02:55 PM    446.0 Triglyceride is over 400; calculations on Lipids are  invalid. Marland Kitchen  Pharmacist Clinical Goal(s): o Over the next 30 days, patient will work with PharmD and providers to achieve LDL goal < 100 and TRIG < 150 . Current regimen:  o Atorvastatin 40 mg daily (not taking) o Vascepa 1 g - 2 cap twice a day . Interventions: o Discussed cholesterol goals and benefits of medications for prevention of heart attack / stroke o Recommend to restart atorvastatin 40 mg daily o Discussed dosing for Vascepa is 2 capsules twice a day - previously was only taking 2 capsules per day . Patient self care activities - Over the next 30 days, patient will: o Restart atorvastatin 40 mg daily o Increase Vascepa to 2 capsules AM and PM as prescribed  Diabetes Lab Results  Component Value Date/Time   HGBA1C 5.5 03/21/2020 02:55 PM   HGBA1C 6.0 10/12/2019 11:58 AM   HGBA1C 8.7 05/25/2018 12:00 AM   HGBA1C 8.7 05/25/2018 12:00 AM   HGBA1C 6.9 (H) 12/25/2015 10:55 AM .  Pharmacist Clinical Goal(s): o Over the next 30 days, patient will work with PharmD and providers to maintain A1c goal <7% . Current regimen:  o Farxiga 10 mg daily . Interventions: o Discussed blood sugar goals and benefits of medications for prevention of diabetic complications o Discussed mechanism of Farxiga and additional benefits for heart failure . Patient self care activities - Over the next 30 days, patient will: o Continue medication as prescribed  Bipolar Depression . Pharmacist Clinical Goal(s) o Over the next 30 days, patient will work with PharmD and providers to optimize therapy . Current regimen:  o Aripiprazole 5 mg daily o Sertraline 100 mg daily o Clonazepam 1 mg twice a day as needed . Interventions: o Discussed fatigue as medication side effects vs symptom of depression o Recommended to move medications to bedtime . Patient self care activities - Over the next 30 days, patient will: o Move aripiprazole and sertraline to bed time and see if daytime fatigue  improves o Follow  up with [name] as scheduled  Medication management . Pharmacist Clinical Goal(s): o Over the next 30 days, patient will work with PharmD and providers to maintain optimal medication adherence . [facility] . Interventions o Comprehensive medication review performed. o Continue current medication management strategy o Recommended use of Pill Box to organize AM and PM medications . Patient self care activities - Over the next 30 days, patient will: o Focus on medication adherence by pill box o Take medications as prescribed o Report any questions or concerns to PharmD and/or provider(s)  Initial goal documentation      Depression Screen PHQ 2/9 Scores 01/15/2021 12/21/2019 10/12/2019 12/16/2018 04/29/2018 02/03/2018 02/26/2015  PHQ - 2 Score 0 5 5 6  0 0 5  PHQ- 9 Score - 17 20 23  0 - 11    Fall Risk Fall Risk  01/15/2021 12/21/2019 12/16/2018 02/03/2018  Falls in the past year? 0 0 0 No  Number falls in past yr: 0 0 0 -  Injury with Fall? 0 0 0 -  Risk for fall due to : No Fall Risks - Orthopedic patient -  Follow up - Falls evaluation completed Falls evaluation completed -    FALL RISK PREVENTION PERTAINING TO THE HOME:  Any stairs in or around the home? No  If so, are there any without handrails? No  Home free of loose throw rugs in walkways, pet beds, electrical cords, etc? Yes  Adequate lighting in your home to reduce risk of falls? Yes   ASSISTIVE DEVICES UTILIZED TO PREVENT FALLS:  Life alert? No  Use of a cane, walker or w/c? No  Grab bars in the bathroom? No  Shower chair or bench in shower? No  Elevated toilet seat or a handicapped toilet? No   TIMED UP AND GO:  Was the test performed? No .  Length of time to ambulate 10 feet: 0 sec.   Gait steady and fast without use of assistive device  Cognitive Function: No flowsheet data found.         Immunizations Immunization History  Administered Date(s) Administered  . 19-influenza Whole 10/20/2011  .  Hep A / Hep B 05/25/2018  . Pneumococcal Polysaccharide-23 08/11/2014  . Tdap 10/20/2011    TDAP status: Up to date  Flu Vaccine status: Declined, Education has been provided regarding the importance of this vaccine but patient still declined. Advised may receive this vaccine at local pharmacy or Health Dept. Aware to provide a copy of the vaccination record if obtained from local pharmacy or Health Dept. Verbalized acceptance and understanding.  Pneumococcal vaccine status: Declined,  Education has been provided regarding the importance of this vaccine but patient still declined. Advised may receive this vaccine at local pharmacy or Health Dept. Aware to provide a copy of the vaccination record if obtained from local pharmacy or Health Dept. Verbalized acceptance and understanding.   Covid-19 vaccine status: Declined, Education has been provided regarding the importance of this vaccine but patient still declined. Advised may receive this vaccine at local pharmacy or Health Dept.or vaccine clinic. Aware to provide a copy of the vaccination record if obtained from local pharmacy or Health Dept. Verbalized acceptance and understanding.  Qualifies for Shingles Vaccine? Yes   Zostavax completed No   Shingrix Completed?: No.    Education has been provided regarding the importance of this vaccine. Patient has been advised to call insurance company to determine out of pocket expense if they have not  yet received this vaccine. Advised may also receive vaccine at local pharmacy or Health Dept. Verbalized acceptance and understanding.  Screening Tests Health Maintenance  Topic Date Due  . COVID-19 Vaccine (1) Never done  . COLONOSCOPY (Pts 45-75yr Insurance coverage will need to be confirmed)  Never done  . OPHTHALMOLOGY EXAM  01/27/2020  . INFLUENZA VACCINE  02/28/2021 (Originally 07/01/2020)  . HEMOGLOBIN A1C  05/20/2021  . TETANUS/TDAP  10/19/2021  . FOOT EXAM  11/19/2021  . PNEUMOCOCCAL  POLYSACCHARIDE VACCINE AGE 56-64 HIGH RISK  Completed  . Hepatitis C Screening  Completed  . HIV Screening  Completed    Health Maintenance  Health Maintenance Due  Topic Date Due  . COVID-19 Vaccine (1) Never done  . COLONOSCOPY (Pts 45-456yrInsurance coverage will need to be confirmed)  Never done  . OPHTHALMOLOGY EXAM  01/27/2020    Colorectal cancer screening: Referral to GI placed for Cologuard. Pt aware the office will call re: appt.  Lung Cancer Screening: (Low Dose CT Chest recommended if Age 53-80ears, 30 pack-year currently smoking OR have quit w/in 15years.) does qualify.   Lung Cancer Screening Referral: no  Additional Screening:  Hepatitis C Screening: does qualify; Completed yes  Vision Screening: Recommended annual ophthalmology exams for early detection of glaucoma and other disorders of the eye. Is the patient up to date with their annual eye exam?  Yes  Who is the provider or what is the name of the office in which the patient attends annual eye exams? [facility] If pt is not established with a provider, would they like to be referred to a provider to establish care? No .   Dental Screening: Recommended annual dental exams for proper oral hygiene  Community Resource Referral / Chronic Care Management: CRR required this visit?  No   CCM required this visit?  No      Plan:     I have personally reviewed and noted the following in the patient's chart:   . Medical and social history . Use of alcohol, tobacco or illicit drugs  . Current medications and supplements . Functional ability and status . Nutritional status . Physical activity . Advanced directives . List of other physicians . Hospitalizations, surgeries, and ER visits in previous 12 months . Vitals . Screenings to include cognitive, depression, and falls . Referrals and appointments  In addition, I have reviewed and discussed with patient certain preventive protocols, quality  metrics, and best practice recommendations. A written personalized care plan for preventive services as well as general preventive health recommendations were provided to patient.     ShSheral FlowLPN   01/08/91/1194 Nurse Notes: Patient is cogitatively intact. There were no vitals filed for this visit. There is no height or weight on file to calculate BMI. Patient stated that he has no issues with gait or balance; does not use any assistive devices.

## 2021-01-15 NOTE — Patient Instructions (Signed)
Visit Information  Phone number for Pharmacist: [phone]  Goals Addressed   None    Patient Care Plan: CCM Pharmacy Care Plan    Problem Identified: Hypertension, Hyperlipidemia, Diabetes, Atrial Fibrillation, Heart Failure, Depression and Anxiety   Priority: High    Long-Range Goal: Disease management   Start Date: 01/15/2021  Expected End Date: 07/15/2021  This Visit's Progress: On track  Priority: High  Note:   Current Barriers:  . Unable to independently monitor therapeutic efficacy . Unable to achieve control of mood/fatigue   Pharmacist Clinical Goal(s):  Marland Kitchen Over the next 90 days, patient will achieve adherence to monitoring guidelines and medication adherence to achieve therapeutic efficacy . maintain control of mood/fatigue as evidenced by patient report  through collaboration with PharmD and provider.   Interventions: . 1:1 collaboration with [name], MD regarding development and update of comprehensive plan of care as evidenced by provider attestation and co-signature . Inter-disciplinary care team collaboration (see longitudinal plan of care) . Comprehensive medication review performed; medication list updated in electronic medical record  Hypertension / Heart Failure (BP goal < 130/80) Controlled - BP is below goal, sometimes overcontrolled with feelings of dizziness/lightheadedness; pt has declined to start valsartan because of this Last ejection fraction 01/23/2020: 20-25% NYHA Class: II (slight limitation of activity) AHA HF Stage: C (Heart disease and symptoms present) Home BP readings: unable to take, monitor is broken Current regimen:  ? Metoprolol succinate 100 mg daily ? Spironolactone 25 mg daily ? Torsemide 20-30 mg daily as needed ? Farxiga 10 mg daily ? Potassium chloride 20 mEq - 2 tab daily as needed Interventions: ? Recommend discussing optimal metoprolol dose with cardiologist given side effects of fatigue. Advised to move dose to  evening to see if it this helps with daytime fatigue. ? May consider switching spironolactone to an ARB for mortality benefit in HF Patient self care activities  ? Check BP daily, document, and provide at future appointments ? Ensure daily salt intake < 2300 mg/day ? Move metoprolol to evening ? Follow up with [name] as scheduled   Hyperlipidemia (LDL goal < 100 and TRIG goal < 150) Uncontrolled  - recent LDL was above goal due to patient not taking statin; he has since restarted statin Current regimen:  ? Atorvastatin 40 mg daily ? Vascepa 1 g - 2 cap twice a day Interventions: ? Discussed cholesterol goals and benefits of medications for prevention of heart attack / stroke ? Discussed dosing for Vascepa is 2 capsules twice a day - previously was only taking 2 capsules per day Patient self care activities ? Increase Vascepa to 2 capsules AM and PM as prescribed   Diabetes (A1c goal < 7%) Current regimen:  ? Farxiga 10 mg daily Interventions: ? Discussed blood sugar goals and benefits of medications for prevention of diabetic complications ? Discussed mechanism of Wilder Glade and additional benefits for heart failure Patient self care activities  ? Continue medication as prescribed   Bipolar Depression Current regimen:  ? Aripiprazole 10 mg daily ? Sertraline 100 mg daily ? Clonazepam 1 mg twice a day as needed ? Strattera 40 mg daily -stopped after 2-3 doses Interventions: ? Discussed fatigue as medication side effects vs symptom of depression ? Recommended to move medications to bedtime ? Discussed dangers of stimulant medications given his cardiac history - stimulants will likely raise BP and HR which would be dangerous. Recommend to avoid stimulants to help with "focusing" Patient self care activities  ? Follow  up with [name] as scheduled  Patient Goals/Self-Care Activities . Over the next 90 days, patient will:  - take medications as prescribed focus on medication  adherence by pill box check blood pressure daily, document, and provide at future appointments weigh daily, and contact provider if weight gain of 3 lbs overnight or 5 lbs in a week target a minimum of 150 minutes of moderate intensity exercise weekly  Follow Up Plan: Telephone follow up appointment with care management team member scheduled for: 3 months      The patient verbalized understanding of instructions, educational materials, and care plan provided today and declined offer to receive copy of patient instructions, educational materials, and care plan.  Telephone follow up appointment with pharmacy team member scheduled for: 3 months  Charlene Brooke, PharmD, [facility] Clinical Pharmacist [facility] at [facility] [phone]

## 2021-01-15 NOTE — Progress Notes (Signed)
Chronic Care Management Pharmacy Note  01/15/2021 Name:  [name] MRN:  [id number] DOB:  [birth year]  Subjective: [name] is an 55 y.o. year old male who is a primary patient of Adam Lima, MD.  The CCM team was consulted for assistance with disease management and care coordination needs.    Engaged with patient by telephone for follow up visit in response to provider referral for pharmacy case management and/or care coordination services.   Consent to Services:  The patient was given the following information about Chronic Care Management services today, agreed to services, and gave verbal consent: 1. CCM service includes personalized support from designated clinical staff supervised by the primary care provider, including individualized plan of care and coordination with other care providers 2. 24/7 contact phone numbers for assistance for urgent and routine care needs. 3. Service will only be billed when office clinical staff spend 20 minutes or more in a month to coordinate care. 4. Only one practitioner may furnish and bill the service in a calendar month. 5.The patient may stop CCM services at any time (effective at the end of the month) by phone call to the office staff. 6. The patient will be responsible for cost sharing (co-pay) of up to 20% of the service fee (after annual deductible is met). Patient agreed to services and consent obtained.  Patient Care Team: Adam Lima, MD as PCP - General (Internal Medicine) Adam Margarita, MD as PCP - Cardiology (Cardiology) Adam Dresser, MD as PCP - Advanced Heart Failure (Cardiology) Adam Bowie Alphonse Guild, MD as Consulting Physician (Cardiology) [name], [name] as Pharmacist (Pharmacist)  Recent office visits: 11/19/20 [name] OV: CPX. LFTs mildly elevated, avoid alcohol. LDL above goal, counseled statin compliance. Rx'd thiamine 100 mg daily.  06/22/20 [name] OV: acute visit for pubic abscess - rx Bactrim  and mupirocin  03/21/20 [name] OV: chronic f/u. Stop insulin d/t A1c 5.5. High Trig- Compliance counseling for Vascepa, avoid alcohol, decrease fat/sugar intake.  Recent consult visits: 12/13/20 [name] (psych): rx'd strattera for inattention/trouble focusing. Of note pt and wife having difficulties, he had moved out for a while.  12/07/20 [name] (cardiology): f/u OSA, will get new BiPAP device since old one was broken.  06/27/20 [name] (psych): increased sertraline to 100 mg daily, plan to titrate to 200 mg for optimal effect. Viibyrd back down to 20 mg.  05/15/20 [name] pharmacy appt: start valsartan 20 mg BID and Chantix.  05/01/20 [name] (psych): increase Viibryd to 40 mg  04/12/20 [name] (HF clinic): pt stopped losartan b/c he thought it was causing nosebleeds. No Entresto d/t hx angioedema. Restart Vascepa  03/05/20 [name] (psych): dx Bipolar 1 and panic disorder. Continue clonazepam, start sertraline and Abilify for mood sx.  Hospital visits: None in previous 6 months  Objective:  Lab Results  Component Value Date   CREATININE 1.08 11/19/2020   BUN 14 11/19/2020   GFR 79.10 11/19/2020   GFRNONAA >60 05/15/2020   GFRAA >60 05/15/2020   NA 138 11/19/2020   K 3.5 11/19/2020   CALCIUM 9.2 11/19/2020   CO2 24 11/19/2020    Lab Results  Component Value Date/Time   HGBA1C 6.2 (A) 11/19/2020 02:28 PM   HGBA1C 5.5 03/21/2020 02:55 PM   HGBA1C 6.0 10/12/2019 11:58 AM   HGBA1C 8.7 05/25/2018 12:00 AM   HGBA1C 8.7 05/25/2018 12:00 AM   HGBA1C 6.9 (H) 12/25/2015 10:55 AM  GFR 79.10 11/19/2020 02:54 PM   GFR 75.63 03/21/2020 02:55 PM   MICROALBUR 2.1 (H) 11/19/2020 02:54 PM   MICROALBUR 0.9 01/19/2019 04:39 PM    Last diabetic Eye exam:  Lab Results  Component Value Date/Time   HMDIABEYEEXA No Retinopathy 01/26/2019 12:00 AM    Last diabetic Foot exam: No results found for: HMDIABFOOTEX   Lab Results  Component Value Date   CHOL 226 (H) 11/19/2020    HDL 35.00 (L) 11/19/2020   LDLCALC 164 (H) 11/19/2020   LDLDIRECT 114.0 03/21/2020   TRIG 133.0 11/19/2020   CHOLHDL 6 11/19/2020    Hepatic Function Latest Ref Rng & Units 11/19/2020 03/21/2020 10/11/2019  Total Protein 6.0 - 8.3 g/dL 7.6 7.3 6.8  Albumin 3.5 - 5.2 g/dL 3.9 3.8 3.1(L)  AST 0 - 37 U/L 19 23 16   ALT 0 - 53 U/L 28 29 24   Alk Phosphatase 39 - 117 U/L 175(H) 90 73  Total Bilirubin 0.2 - 1.2 mg/dL 1.0 0.5 0.7  Bilirubin, Direct 0.0 - 0.3 mg/dL 0.3 0.1 -    Lab Results  Component Value Date/Time   TSH 1.27 03/21/2020 02:55 PM   TSH 1.54 01/19/2019 04:21 PM    CBC Latest Ref Rng & Units 11/19/2020 03/21/2020 10/11/2019  WBC 4.0 - 10.5 K/uL 11.1(H) 9.2 9.3  Hemoglobin 13.0 - 17.0 g/dL 15.9 15.9 14.8  Hematocrit 39.0 - 52.0 % 45.9 46.4 42.7  Platelets 150.0 - 400.0 K/uL 243.0 265.0 251    Lab Results  Component Value Date/Time   VD25OH 44.92 11/19/2020 02:54 PM   VD25OH 48.62 04/12/2019 10:41 AM    Clinical ASCVD: No  The 10-year ASCVD risk score [name], et al., 2013) is: 15.4%   Values used to calculate the score:     Age: 13 years     Sex: Male     Is Non-Hispanic African American: Yes     Diabetic: Yes     Tobacco smoker: No     Systolic Blood Pressure: 449 mmHg     Is BP treated: Yes     HDL Cholesterol: 35 mg/dL     Total Cholesterol: 226 mg/dL    Depression screen [facility] 2/9 01/15/2021 12/21/2019 10/12/2019  Decreased Interest 0 3 3  Down, Depressed, Hopeless 0 2 2  PHQ - 2 Score 0 5 5  Altered sleeping - 3 3  Tired, decreased energy - 2 3  Change in appetite - 3 3  Feeling bad or failure about yourself  - 2 3  Trouble concentrating - 2 2  Moving slowly or fidgety/restless - 0 1  Suicidal thoughts - 0 0  PHQ-9 Score - 17 20  Difficult doing work/chores - Very difficult Very difficult  Some recent data might be hidden     CHA2DS2-VASc Score = 3  The patient's score is based upon: CHF History: Yes HTN History: Yes Diabetes History:  Yes Stroke History: No Vascular Disease History: No   Social History   Tobacco Use  Smoking Status Former Smoker  . Packs/day: 0.50  . Years: 30.00  . Pack years: 15.00  . Types: Cigarettes  . Quit date: 05/15/2020  . Years since quitting: 0.6  Smokeless Tobacco Never Used  Tobacco Comment   vaping - nicotine-free products   BP Readings from Last 3 Encounters:  12/07/20 110/70  11/19/20 132/86  06/22/20 120/80   Pulse Readings from Last 3 Encounters:  12/07/20 70  11/19/20 80  06/22/20 70  Wt Readings from Last 3 Encounters:  12/07/20 (!) 379 lb 12.8 oz (172.3 kg)  12/03/20 (!) 387 lb (175.5 kg)  11/19/20 (!) 386 lb (175.1 kg)    Assessment/Interventions: Review of patient past medical history, allergies, medications, health status, including review of consultants reports, laboratory and other test data, was performed as part of comprehensive evaluation and provision of chronic care management services.   SDOH:  (Social Determinants of Health) assessments and interventions performed: Yes   CCM Care Plan  Allergies  Allergen Reactions  . Ace Inhibitors Anaphylaxis and Swelling    Angioedema  . Bidil [Isosorb Dinitrate-Hydralazine] Other (See Comments)    headache  . Digoxin And Related     Unspecified "side effects"  . Buspirone Other (See Comments)    dizziness    Medications Reviewed Today    Reviewed by [name], [facility] (Pharmacist) on 01/15/21 at [place] List Status: <None>  Medication Order Taking? Sig Documenting Provider Last Dose Status Informant  ARIPiprazole (ABILIFY) 10 MG tablet [id number] Yes Take 1 tablet (10 mg total) by mouth daily. [place] Crane, MD Taking Active        Patient not taking:      Discontinued 01/15/21 1624 (Patient Preference)   atorvastatin (LIPITOR) 40 MG tablet [id number] Yes Take 1 tablet (40 mg total) by mouth daily. Adam Lima, MD Taking Active   blood glucose meter kit and supplies KIT [id number] Yes Use  to check blood sugar. DX E11.9 [name], FNP Taking Active Other  clonazePAM Bobbye [place]) 1 MG tablet [id number] Yes Take 1 tablet (1 mg total) by mouth 2 (two) times daily as needed for anxiety. [place] Crane, MD Taking Active   FARXIGA 10 MG TABS tablet [id number] Yes TAKE 1 TABLET BY MOUTH EVERY DAY Adam Lima, MD Taking Active   metoprolol succinate (TOPROL-XL) 100 MG 24 hr tablet [id number] Yes TAKE 1 TABLET (100 MG TOTAL) BY MOUTH DAILY. TAKE WITH OR IMMEDIATELY FOLLOWING A MEAL. Adam Dresser, MD Taking Active   pantoprazole (PROTONIX) 40 MG tablet [id number] Yes TAKE 1 TABLET BY MOUTH EVERY DAY Adam Lima, MD Taking Active   potassium chloride SA (KLOR-CON) 20 MEQ tablet [id number] Yes Take 40 mEq by mouth daily as needed. [provider] Taking Active         Discontinued 02/20/12 1023 (Patient has not taken in last 30 days)   PRESCRIPTION MEDICATION [id number] Yes Inhale into the lungs See admin instructions. Bipap, pressure 16/12 with 2L of O2 - use whenever sleeping [provider] Taking Active Spouse/Significant Other  rivaroxaban (XARELTO) 20 MG TABS tablet [id number] Yes Take 1 tablet (20 mg total) by mouth daily. Adam Lima, MD Taking Active   sertraline (ZOLOFT) 100 MG tablet [id number] Yes Take 1 tablet (100 mg total) by mouth daily. Nevada Crane, MD Taking Active   sildenafil (REVATIO) 20 MG tablet [id number] Yes Take 4 tablets (80 mg total) by mouth daily as needed. Adam Lima, MD Taking Active   spironolactone (ALDACTONE) 25 MG tablet [id number] Yes TAKE 1 TABLET (25 MG TOTAL) BY MOUTH DAILY. *MUST KEEP 12/20 APPOINTMENT FOR REFILLSJanith Lima, MD Taking Active   thiamine 100 MG tablet [id number] Yes Take 1 tablet (100 mg total) by mouth daily. Adam Lima, MD Taking Active   torsemide (DEMADEX) 20 MG tablet [id number] Yes Take 20-30 mg by mouth daily. [provider] Taking Active        Patient not taking:  Discontinued 01/15/21 1624 (Patient Preference)   VASCEPA 1 g capsule [id number] Yes TAKE 2 CAPSULES (2 G TOTAL) BY MOUTH 2 (TWO) TIMES DAILY. Adam Lima, MD Taking Active           Patient Active Problem List   Diagnosis Date Noted  . Attention deficit hyperactivity disorder (ADHD), inattentive type, mild 12/13/2020  . Colon cancer screening 11/19/2020  . Marital conflict 50/27/7412  . Bipolar 1 disorder, depressed, partial remission ([place]) 06/27/2020  . Bipolar I disorder, most recent episode depressed ([place]) 03/05/2020  . Encounter for screening for HIV 01/05/2020  . Hyperlipidemia with target LDL less than 100 10/12/2019  . Screen for colon cancer 10/12/2019  . Hidradenitis suppurativa of left axilla 10/12/2019  . Benign prostatic hyperplasia without lower urinary tract symptoms 10/12/2019  . Claudication of both lower extremities ([place]) 04/20/2019  . Drug-induced erectile dysfunction 02/28/2019  . Vitamin D deficiency disease 01/20/2019  . Severe episode of recurrent major depressive disorder, with psychotic features ([place]) 12/16/2018  . Bipolar 1 disorder, depressed ([place]) 12/16/2018  . Osteoarthritis of both ankles and feet 11/30/2018  . Diabetic neuropathy, painful ([place]) 11/30/2018  . Fatty liver disease, nonalcoholic 87/86/7672  . Thiamin deficiency 04/29/2018  . Cholelithiasis 04/22/2018  . Hyperbilirubinemia   . PE (pulmonary thromboembolism) ([place]) 04/12/2017  . Panic disorder 01/15/2017  . Hereditary and idiopathic peripheral neuropathy 07/16/2016  . AF (paroxysmal atrial fibrillation) ([place])   . Osteoarthritis of both knees 12/27/2014  . NSVT (nonsustained ventricular tachycardia) ([place]) 09/26/2013  . Alcohol abuse 08/10/2013  . Hypertriglyceridemia 06/29/2013  . Nonischemic cardiomyopathy ([place]) 01/26/2013  . Type II diabetes mellitus with manifestations ([place]) 04/13/2012  . Tobacco user 02/18/2012  . ED (erectile dysfunction) 07/11/2011  . Morbid obesity ([place])  06/11/2010  . Essential hypertension 06/11/2010  . Chronic combined systolic and diastolic CHF (congestive heart failure) ([place]) 06/11/2010  . Esophageal reflux 06/11/2010  . OSA (obstructive sleep apnea) with BiPap and oxygen 06/11/2010    Immunization History  Administered Date(s) Administered  . 19-influenza Whole 10/20/2011  . Hep A / Hep B 05/25/2018  . Pneumococcal Polysaccharide-23 08/11/2014  . Tdap 10/20/2011    Conditions to be addressed/monitored:  Hypertension, Hyperlipidemia, Diabetes, Atrial Fibrillation, Heart Failure, Depression and Anxiety  Care Plan : [place]  Updates made by [name], [place] since 01/15/2021 12:00 AM    Problem: Hypertension, Hyperlipidemia, Diabetes, Atrial Fibrillation, Heart Failure, Depression and Anxiety   Priority: High    Long-Range Goal: Disease management   Start Date: 01/15/2021  Expected End Date: 07/15/2021  This Visit's Progress: On track  Priority: High  Note:   Current Barriers:  . Unable to independently monitor therapeutic efficacy . Unable to achieve control of mood/fatigue   Pharmacist Clinical Goal(s):  Marland Kitchen Over the next 90 days, patient will achieve adherence to monitoring guidelines and medication adherence to achieve therapeutic efficacy . maintain control of mood/fatigue as evidenced by patient report  through collaboration with PharmD and provider.   Interventions: . 1:1 collaboration with Adam Lima, MD regarding development and update of comprehensive plan of care as evidenced by provider attestation and co-signature . Inter-disciplinary care team collaboration (see longitudinal plan of care) . Comprehensive medication review performed; medication list updated in electronic medical record  Hypertension / Heart Failure (BP goal < 130/80) Controlled - BP is below goal, sometimes overcontrolled with feelings of dizziness/lightheadedness; pt has declined to start valsartan because of  this Last ejection fraction 01/23/2020: 20-25% NYHA  Class: II (slight limitation of activity) AHA HF Stage: C (Heart disease and symptoms present) Home BP readings: unable to take, monitor is broken Current regimen:  ? Metoprolol succinate 100 mg daily ? Spironolactone 25 mg daily ? Torsemide 20-30 mg daily as needed ? Farxiga 10 mg daily ? Potassium chloride 20 mEq - 2 tab daily as needed Interventions: ? Recommend discussing optimal metoprolol dose with cardiologist given side effects of fatigue. Advised to move dose to evening to see if it this helps with daytime fatigue. ? May consider switching spironolactone to an ARB for mortality benefit in HF Patient self care activities  ? Check BP daily, document, and provide at future appointments ? Ensure daily salt intake < 2300 mg/day ? Move metoprolol to evening ? Follow up with [name] as scheduled   Hyperlipidemia (LDL goal < 100 and TRIG goal < 150) Uncontrolled  - recent LDL was above goal due to patient not taking statin; he has since restarted statin Current regimen:  ? Atorvastatin 40 mg daily ? Vascepa 1 g - 2 cap twice a day Interventions: ? Discussed cholesterol goals and benefits of medications for prevention of heart attack / stroke ? Discussed dosing for Vascepa is 2 capsules twice a day - previously was only taking 2 capsules per day Patient self care activities ? Increase Vascepa to 2 capsules AM and PM as prescribed   Diabetes (A1c goal < 7%) Current regimen:  ? Farxiga 10 mg daily Interventions: ? Discussed blood sugar goals and benefits of medications for prevention of diabetic complications ? Discussed mechanism of Wilder Glade and additional benefits for heart failure Patient self care activities  ? Continue medication as prescribed   Bipolar Depression Current regimen:  ? Aripiprazole 10 mg daily ? Sertraline 100 mg daily ? Clonazepam 1 mg twice a day as needed ? Strattera 40 mg daily -stopped after 2-3  doses Interventions: ? Discussed fatigue as medication side effects vs symptom of depression ? Recommended to move medications to bedtime ? Discussed dangers of stimulant medications given his cardiac history - stimulants will likely raise BP and HR which would be dangerous. Recommend to avoid stimulants to help with "focusing" Patient self care activities  ? Follow up with [name] as scheduled  Patient Goals/Self-Care Activities . Over the next 90 days, patient will:  - take medications as prescribed focus on medication adherence by pill box check blood pressure daily, document, and provide at future appointments weigh daily, and contact provider if weight gain of 3 lbs overnight or 5 lbs in a week target a minimum of 150 minutes of moderate intensity exercise weekly  Follow Up Plan: Telephone follow up appointment with care management team member scheduled for: 3 months      Medication Assistance: None required.  Patient affirms current coverage meets needs.  Patient's preferred pharmacy is:  [id number] [place] [id number]: [phone]: [phone] [id number] [place] [phone]: [phone]: [phone] pill box? Yes Pt endorses 90% compliance  We discussed: Current pharmacy is preferred with insurance plan and patient is satisfied with pharmacy services Patient decided to: Continue current medication management strategy  Care Plan and Follow Up Patient Decision:  Patient agrees to Care Plan and Follow-up.  Plan: Telephone follow up appointment with care management team member scheduled for:  3 months  LiCharlene BrookePharmD,  BCACP Clinical Pharmacist [facility] at [facility] [phone]

## 2021-01-18 DIAGNOSIS — G4733 Obstructive sleep apnea (adult) (pediatric): Secondary | ICD-10-CM | POA: Diagnosis not present

## 2021-01-18 DIAGNOSIS — I5042 Chronic combined systolic (congestive) and diastolic (congestive) heart failure: Secondary | ICD-10-CM | POA: Diagnosis not present

## 2021-01-18 DIAGNOSIS — I2699 Other pulmonary embolism without acute cor pulmonale: Secondary | ICD-10-CM | POA: Diagnosis not present

## 2021-01-24 ENCOUNTER — Telehealth (INDEPENDENT_AMBULATORY_CARE_PROVIDER_SITE_OTHER): Payer: Medicare HMO | Admitting: Psychiatry

## 2021-01-24 ENCOUNTER — Encounter (HOSPITAL_COMMUNITY): Payer: Self-pay | Admitting: Psychiatry

## 2021-01-24 ENCOUNTER — Other Ambulatory Visit: Payer: Self-pay

## 2021-01-24 DIAGNOSIS — F900 Attention-deficit hyperactivity disorder, predominantly inattentive type: Secondary | ICD-10-CM | POA: Diagnosis not present

## 2021-01-24 DIAGNOSIS — Z630 Problems in relationship with spouse or partner: Secondary | ICD-10-CM | POA: Diagnosis not present

## 2021-01-24 DIAGNOSIS — F410 Panic disorder [episodic paroxysmal anxiety] without agoraphobia: Secondary | ICD-10-CM

## 2021-01-24 DIAGNOSIS — F3175 Bipolar disorder, in partial remission, most recent episode depressed: Secondary | ICD-10-CM | POA: Diagnosis not present

## 2021-01-24 DIAGNOSIS — R69 Illness, unspecified: Secondary | ICD-10-CM | POA: Diagnosis not present

## 2021-01-24 MED ORDER — CLONAZEPAM 1 MG PO TABS: 1.0000 mg | ORAL_TABLET | Freq: Two times a day (BID) | ORAL | 1 refills | Status: DC | PRN

## 2021-01-24 MED ORDER — ARIPIPRAZOLE 10 MG PO TABS
10.0000 mg | ORAL_TABLET | Freq: Every day | ORAL | 0 refills | Status: DC
Start: 2021-01-24 — End: 2021-03-07

## 2021-01-24 MED ORDER — SERTRALINE HCL 100 MG PO TABS: 100.0000 mg | ORAL_TABLET | Freq: Every day | ORAL | 0 refills | Status: DC

## 2021-01-24 NOTE — Progress Notes (Signed)
[facility] MD/PA/NP OP Progress Note  Virtual Visit via Telephone Note  I connected with [name] on 01/24/21 at 11:00 AM EST by telephone and verified that I am speaking with the correct person using two identifiers.  Location: Patient: home Provider: Clinic   I discussed the limitations, risks, security and privacy concerns of performing an evaluation and management service by telephone and the availability of in person appointments. I also discussed with the patient that there may be a patient responsible charge related to this service. The patient expressed understanding and agreed to proceed.   I provided 16 minutes of non-face-to-face time during this encounter.    01/24/2021 11:01 AM [name]  MRN:  [id number]  Chief Complaint:  " I am doing better."  HPI: Patient reported that he is feeling better now.  He moved back in with his wife and things have been going better than before.  He informed that his wife is now working for [employer] system and ever since she started a job things have been a fall in place.  His son is still living with his ex and attending school there. He feels overall his mood is more stable and things are better in general. Regarding Strattera, he stated that he tried it however when he got a phone call from the nurses they discussed the possibility of Strattera causing him to have tachycardia and therefore he decided to discontinue taking it. He denies any acute issues or concerns today and feels like he is on the right track for now.   Visit Diagnosis:    ICD-10-CM   1. Bipolar 1 disorder, depressed, partial remission ([place])  F31.75   2. Panic disorder  F41.0   3. Marital conflict  X48.0   4. Attention deficit hyperactivity disorder (ADHD), inattentive type, mild  F90.0     Past Psychiatric History: Bipolar disorder, anxiety, panic attacks  Past Medical History:  Past Medical History:  Diagnosis Date  . Alcohol abuse   . Anxiety  state, unspecified   . Atrial fibrillation ([place])   . CHF (congestive heart failure) ([place])   . Chronic systolic heart failure ([place])   . Diabetes mellitus, type II ([place])   . Edema   . Gout   . History of medication noncompliance   . Migraine   . Obesity, unspecified   . Obstructive sleep apnea   . Psychiatric disorder   . Pulmonary embolism ([place])   . Shortness of breath     Past Surgical History:  Procedure Laterality Date  . CARDIAC CATHETERIZATION    . CARDIAC CATHETERIZATION N/A 08/13/2015   Procedure: Right/Left Heart Cath and Coronary Angiography;  Surgeon: [name], MD;  Location: [facility];  Service: Cardiovascular;  Laterality: N/A;  . RIGHT/LEFT HEART CATH AND CORONARY ANGIOGRAPHY N/A 04/14/2017   Procedure: Right/Left Heart Cath and Coronary Angiography;  Surgeon: [name], MD;  Location: [facility];  Service: Cardiovascular;  Laterality: N/A;  . TESTICLE SURGERY      Family Psychiatric History: History of depression and bipolar in siblings  Family History:  Family History  Problem Relation Age of Onset  . Cancer Mother        brain tumor  . Hypertension Mother   . Diabetes Father        Deceased, 52  . Heart disease Maternal Grandmother   . Hypertension Other        Family History  . Stroke Other  Family History  . Diabetes Other        Family History  . Diabetes Daughter     Social History:  Social History   Socioeconomic History  . Marital status: Married    Spouse name: Not on file  . Number of children: 3  . Years of education: Not on file  . Highest education level: Not on file  Occupational History  . Occupation: DISABLED  Tobacco Use  . Smoking status: Former Smoker    Packs/day: 0.50    Years: 30.00    Pack years: 15.00    Types: Cigarettes    Quit date: 05/15/2020    Years since quitting: 0.6  . Smokeless tobacco: Never Used  . Tobacco comment: vaping - nicotine-free products  Vaping Use  . Vaping  Use: Never used  Substance and Sexual Activity  . Alcohol use: Not Currently    Alcohol/week: 0.0 standard drinks  . Drug use: No  . Sexual activity: Not Currently  Other Topics Concern  . Not on file  Social History Narrative   He smokes about a pack per day and he has been smoking since he was 55 years of age.  He drinks alcohol occasionally, but he denies any illicit drug abuse.  He is presently on disability.    Lives with wife in a 2 story home.  Has 2 children.   Previously worked in [employer], last worked in 1998.   Highest level of education:  11th grade           Social Determinants of Health   Financial Resource Strain: Low Risk   . Difficulty of Paying Living Expenses: Not hard at all  Food Insecurity: No Food Insecurity  . Worried About Charity fundraiser in the Last Year: Never true  . Ran Out of Food in the Last Year: Never true  Transportation Needs: No Transportation Needs  . Lack of Transportation (Medical): No  . Lack of Transportation (Non-Medical): No  Physical Activity: Sufficiently Active  . Days of Exercise per Week: 5 days  . Minutes of Exercise per Session: 30 min  Stress: No Stress Concern Present  . Feeling of Stress : Not at all  Social Connections: Moderately Isolated  . Frequency of Communication with Friends and Family: More than three times a week  . Frequency of Social Gatherings with Friends and Family: Never  . Attends Religious Services: Never  . Active Member of Clubs or Organizations: No  . Attends Archivist Meetings: Never  . Marital Status: Married    Allergies:  Allergies  Allergen Reactions  . Ace Inhibitors Anaphylaxis and Swelling    Angioedema  . Bidil [Isosorb Dinitrate-Hydralazine] Other (See Comments)    headache  . Digoxin And Related     Unspecified "side effects"  . Buspirone Other (See Comments)    dizziness    Metabolic Disorder Labs: Lab Results  Component Value Date   HGBA1C 6.2 (A) 11/19/2020    MPG 275 04/12/2019   MPG 142.72 11/21/2018   No results found for: PROLACTIN Lab Results  Component Value Date   CHOL 226 (H) 11/19/2020   TRIG 133.0 11/19/2020   HDL 35.00 (L) 11/19/2020   CHOLHDL 6 11/19/2020   VLDL 26.6 11/19/2020   LDLCALC 164 (H) 11/19/2020   LDLCALC 128 (H) 12/07/2018   Lab Results  Component Value Date   TSH 1.27 03/21/2020   TSH 1.54 01/19/2019    Therapeutic Level Labs: No results found  for: LITHIUM No results found for: VALPROATE No components found for:  CBMZ  Current Medications: Current Outpatient Medications  Medication Sig Dispense Refill  . ARIPiprazole (ABILIFY) 10 MG tablet Take 1 tablet (10 mg total) by mouth daily. 90 tablet 0  . atorvastatin (LIPITOR) 40 MG tablet Take 1 tablet (40 mg total) by mouth daily. 90 tablet 1  . blood glucose meter kit and supplies KIT Use to check blood sugar. DX E11.9 1 each 0  . clonazePAM (KLONOPIN) 1 MG tablet Take 1 tablet (1 mg total) by mouth 2 (two) times daily as needed for anxiety. 60 tablet 1  . FARXIGA 10 MG TABS tablet TAKE 1 TABLET BY MOUTH EVERY DAY 90 tablet 1  . metoprolol succinate (TOPROL-XL) 100 MG 24 hr tablet TAKE 1 TABLET (100 MG TOTAL) BY MOUTH DAILY. TAKE WITH OR IMMEDIATELY FOLLOWING A MEAL. 90 tablet 3  . pantoprazole (PROTONIX) 40 MG tablet TAKE 1 TABLET BY MOUTH EVERY DAY 90 tablet 1  . potassium chloride SA (KLOR-CON) 20 MEQ tablet Take 40 mEq by mouth daily as needed.    Marland Kitchen PRESCRIPTION MEDICATION Inhale into the lungs See admin instructions. Bipap, pressure 16/12 with 2L of O2 - use whenever sleeping    . rivaroxaban (XARELTO) 20 MG TABS tablet Take 1 tablet (20 mg total) by mouth daily. 90 tablet 1  . sertraline (ZOLOFT) 100 MG tablet Take 1 tablet (100 mg total) by mouth daily. 90 tablet 0  . sildenafil (REVATIO) 20 MG tablet Take 4 tablets (80 mg total) by mouth daily as needed. 60 tablet 5  . spironolactone (ALDACTONE) 25 MG tablet TAKE 1 TABLET (25 MG TOTAL) BY MOUTH DAILY.  *MUST KEEP 12/20 APPOINTMENT FOR REFILLS* 90 tablet 1  . thiamine 100 MG tablet Take 1 tablet (100 mg total) by mouth daily. 90 tablet 1  . torsemide (DEMADEX) 20 MG tablet Take 20-30 mg by mouth daily.    Marland Kitchen VASCEPA 1 g capsule TAKE 2 CAPSULES (2 G TOTAL) BY MOUTH 2 (TWO) TIMES DAILY. 360 capsule 1   No current facility-administered medications for this visit.       Psychiatric Specialty Exam: Review of Systems  There were no vitals taken for this visit.There is no height or weight on file to calculate BMI.  General Appearance: unable to assess due to phone visit  Eye Contact:  unable to assess due to phone visit  Speech:  Clear and Coherent  Volume:  Normal  Mood:  Euthymic  Affect:  Congruent  Thought Process:  Coherent  Orientation:  Full (Time, Place, and Person)  Thought Content: WDL   Suicidal Thoughts:  No  Homicidal Thoughts:  No  Memory:  Immediate;   Good Recent;   Good  Judgement:  Fair  Insight:  Fair  Psychomotor Activity:  Normal  Concentration:  Concentration: Good  Recall:  Good  Fund of Knowledge: Good  Language: Good  Akathisia:  No  Handed:  Right  AIMS (if indicated): not done  Assets:  Communication Skills Desire for Improvement Financial Resources/Insurance Housing Intimacy  ADL's:  Intact  Cognition: WNL  Sleep:  Fair   Screenings: PHQ2-9   Flowsheet Row Clinical Support from 01/15/2021 in [place] [facility] at [facility] Visit from 12/21/2019 in [facility] at [facility] Visit from 10/12/2019 in [facility] Visit from 12/16/2018 in [facility] from 04/29/2018 in [facility]  PHQ-2 Total Score  0 _0 0  PHQ-9 Total Score - _1 0       Assessment and Plan: Patient reported some improvement in his symptoms ever since he returned back to live with his wife.  He stated that the relationship is now improved and overall he  feels he is doing better. He did not continue Strattera due to the possibility of tachycardia associated with it.   1. Bipolar 1 disorder, depressed, partial remission (H[facility])  - ARIPiprazole (ABILIFY) 10 MG tablet; Take 1 tablet (10 mg total) by mouth daily.  Dispense: 90 tablet; Refill: 0 - sertraline (ZOLOFT) 100 MG tablet; Take 1 tablet (100 mg total) by mouth daily.  Dispense: 90 tablet; Refill: 0  2. Panic disorder  - clonazePAM (KLONOPIN) 1 MG tablet; Take 1 tablet (1 mg total) by mouth 2 (two) times daily as needed for anxiety.  Dispense: 60 tablet; Refill: 1 - sertraline (ZOLOFT) 100 MG tablet; Take 1 tablet (100 mg total) by mouth daily.  Dispense: 90 tablet; Refill: 0  3. Marital conflict   4. Attention deficit hyperactivity disorder (ADHD), inattentive type, mild   Continue same medications Follow-up in 2 months.    Nevada Crane, MD 01/24/2021, 11:01 AM

## 2021-01-29 ENCOUNTER — Telehealth: Payer: Self-pay | Admitting: Pharmacist

## 2021-01-29 NOTE — Progress Notes (Signed)
Chronic Care Management Pharmacy Assistant   Name: [name]  MRN: [id number] DOB: [birth year]  Reason for Encounter: Cholesterol Adherence Call   PCP : [name], MD  Allergies:   Allergies  Allergen Reactions  . Ace Inhibitors Anaphylaxis and Swelling    Angioedema  . Bidil [Isosorb Dinitrate-Hydralazine] Other (See Comments)    headache  . Digoxin And Related     Unspecified "side effects"  . Buspirone Other (See Comments)    dizziness    Medications: Outpatient Encounter Medications as of 01/29/2021  Medication Sig  . ARIPiprazole (ABILIFY) 10 MG tablet Take 1 tablet (10 mg total) by mouth daily.  Marland Kitchen atorvastatin (LIPITOR) 40 MG tablet Take 1 tablet (40 mg total) by mouth daily.  . blood glucose meter kit and supplies KIT Use to check blood sugar. DX E11.9  . clonazePAM (KLONOPIN) 1 MG tablet Take 1 tablet (1 mg total) by mouth 2 (two) times daily as needed for anxiety.  Marland Kitchen FARXIGA 10 MG TABS tablet TAKE 1 TABLET BY MOUTH EVERY DAY  . metoprolol succinate (TOPROL-XL) 100 MG 24 hr tablet TAKE 1 TABLET (100 MG TOTAL) BY MOUTH DAILY. TAKE WITH OR IMMEDIATELY FOLLOWING A MEAL.  . pantoprazole (PROTONIX) 40 MG tablet TAKE 1 TABLET BY MOUTH EVERY DAY  . potassium chloride SA (KLOR-CON) 20 MEQ tablet Take 40 mEq by mouth daily as needed.  Marland Kitchen PRESCRIPTION MEDICATION Inhale into the lungs See admin instructions. Bipap, pressure 16/12 with 2L of O2 - use whenever sleeping  . rivaroxaban (XARELTO) 20 MG TABS tablet Take 1 tablet (20 mg total) by mouth daily.  . sertraline (ZOLOFT) 100 MG tablet Take 1 tablet (100 mg total) by mouth daily.  . sildenafil (REVATIO) 20 MG tablet Take 4 tablets (80 mg total) by mouth daily as needed.  Marland Kitchen spironolactone (ALDACTONE) 25 MG tablet TAKE 1 TABLET (25 MG TOTAL) BY MOUTH DAILY. *MUST KEEP 12/20 APPOINTMENT FOR REFILLS*  . thiamine 100 MG tablet Take 1 tablet (100 mg total) by mouth daily.  Marland Kitchen torsemide (DEMADEX) 20 MG tablet Take 20-30 mg  by mouth daily.  Marland Kitchen VASCEPA 1 g capsule TAKE 2 CAPSULES (2 G TOTAL) BY MOUTH 2 (TWO) TIMES DAILY.  . [DISCONTINUED] pravastatin (PRAVACHOL) 40 MG tablet Take 40 mg by mouth daily.   No facility-administered encounter medications on file as of 01/29/2021.    Current Diagnosis: Patient Active Problem List   Diagnosis Date Noted  . Attention deficit hyperactivity disorder (ADHD), inattentive type, mild 12/13/2020  . Colon cancer screening 11/19/2020  . Marital conflict 90/38/3338  . Bipolar 1 disorder, depressed, partial remission ([place]) 06/27/2020  . Bipolar I disorder, most recent episode depressed ([place]) 03/05/2020  . Encounter for screening for HIV 01/05/2020  . Hyperlipidemia with target LDL less than 100 10/12/2019  . Screen for colon cancer 10/12/2019  . Hidradenitis suppurativa of left axilla 10/12/2019  . Benign prostatic hyperplasia without lower urinary tract symptoms 10/12/2019  . Claudication of both lower extremities ([place]) 04/20/2019  . Drug-induced erectile dysfunction 02/28/2019  . Vitamin D deficiency disease 01/20/2019  . Severe episode of recurrent major depressive disorder, with psychotic features ([place]) 12/16/2018  . Bipolar 1 disorder, depressed ([place]) 12/16/2018  . Osteoarthritis of both ankles and feet 11/30/2018  . Diabetic neuropathy, painful ([place]) 11/30/2018  . Fatty liver disease, nonalcoholic 32/91/9166  . Thiamin deficiency 04/29/2018  . Cholelithiasis 04/22/2018  . Hyperbilirubinemia   . PE (pulmonary thromboembolism) ([place]) 04/12/2017  . Panic disorder  01/15/2017  . Hereditary and idiopathic peripheral neuropathy 07/16/2016  . AF (paroxysmal atrial fibrillation) ([place])   . Osteoarthritis of both knees 12/27/2014  . NSVT (nonsustained ventricular tachycardia) ([place]) 09/26/2013  . Alcohol abuse 08/10/2013  . Hypertriglyceridemia 06/29/2013  . Nonischemic cardiomyopathy ([place]) 01/26/2013  . Type II diabetes mellitus with manifestations ([place]) 04/13/2012  .  Tobacco user 02/18/2012  . ED (erectile dysfunction) 07/11/2011  . Morbid obesity ([place]) 06/11/2010  . Essential hypertension 06/11/2010  . Chronic combined systolic and diastolic CHF (congestive heart failure) ([place]) 06/11/2010  . Esophageal reflux 06/11/2010  . OSA (obstructive sleep apnea) with BiPap and oxygen 06/11/2010    Goals Addressed   None     Follow-Up:  Pharmacist Review   01/29/2021 Name: [name] MRN: [id number] DOB: Jun 24, 1966 [name] is a 55 y.o. year old male who is a primary care patient of [name], MD.  Comprehensive medication review performed; Spoke to patient regarding cholesterol  Lipid Panel    Component Value Date/Time   CHOL 226 (H) 11/19/2020 1454   TRIG 133.0 11/19/2020 1454   HDL 35.00 (L) 11/19/2020 1454   LDLCALC 164 (H) 11/19/2020 1454   LDLDIRECT 114.0 03/21/2020 1455    10-year ASCVD risk score: The 10-year ASCVD risk score Mikey Bussing DC Brooke Bonito., et al., 2013) is: 15.4%   Values used to calculate the score:     Age: 55 years     Sex: Male     Is Non-Hispanic African American: Yes     Diabetic: Yes     Tobacco smoker: No     Systolic Blood Pressure: 254 mmHg     Is BP treated: Yes     HDL Cholesterol: 35 mg/dL     Total Cholesterol: 226 mg/dL  . Current antihyperlipidemic regimen: The patient states that he is taking atorvastatin and vascepa for cholesterol   . Previous antihyperlipidemic medications tried: None . ASCVD risk enhancing conditions: None . What recent interventions/DTPs have been made by any provider to improve Cholesterol control since last CPP Visit: None . Any recent hospitalizations or ED visits since last visit with CPP? The patient has not been to the hospital or the ED . What diet changes have been made to improve Cholesterol? The patient states that he is trying to eat more lean meats, fruits and vegetables to help with cholesterol which he states is back to normal  . What exercise is being done to  improve Cholesterol? The patient states that he is doing a lot of walking lately at least 3 times a week   Adherence Review: Does the patient have >5 day gap between last estimated fill dates? No   Wendy Poet, [place]   Time spent:20

## 2021-02-15 DIAGNOSIS — I5042 Chronic combined systolic (congestive) and diastolic (congestive) heart failure: Secondary | ICD-10-CM | POA: Diagnosis not present

## 2021-02-15 DIAGNOSIS — I2699 Other pulmonary embolism without acute cor pulmonale: Secondary | ICD-10-CM | POA: Diagnosis not present

## 2021-02-15 DIAGNOSIS — G4733 Obstructive sleep apnea (adult) (pediatric): Secondary | ICD-10-CM | POA: Diagnosis not present

## 2021-02-18 ENCOUNTER — Encounter: Payer: Self-pay | Admitting: Cardiology

## 2021-02-18 ENCOUNTER — Telehealth (INDEPENDENT_AMBULATORY_CARE_PROVIDER_SITE_OTHER): Payer: Medicare HMO | Admitting: Cardiology

## 2021-02-18 ENCOUNTER — Other Ambulatory Visit: Payer: Self-pay

## 2021-02-18 VITALS — Ht 69.0 in | Wt 380.0 lb

## 2021-02-18 DIAGNOSIS — G4733 Obstructive sleep apnea (adult) (pediatric): Secondary | ICD-10-CM

## 2021-02-18 DIAGNOSIS — I10 Essential (primary) hypertension: Secondary | ICD-10-CM

## 2021-02-18 NOTE — Progress Notes (Signed)
Virtual Visit via Video Note   This visit type was conducted due to national recommendations for restrictions regarding the COVID-19 Pandemic (e.g. social distancing) in an effort to limit this patient's exposure and mitigate transmission in our community.  Due to his co-morbid illnesses, this patient is at least at moderate risk for complications without adequate follow up.  This format is felt to be most appropriate for this patient at this time.  All issues noted in this document were discussed and addressed.  A limited physical exam was performed with this format.  Please refer to the patient's chart for his consent to telehealth for [facility].   Date:  02/18/2021   ID:  [name], DOB 07-22-1966, MRN [id number] The patient was identified using 2 identifiers.  Patient Location: Home Provider Location: Home Office   PCP:  [name], [place]  Cardiologist: [name], MD  Electrophysiologist:  None  Sleep Medicine:  Fransico Him   {  Evaluation Performed:  Follow-Up Visit  Chief Complaint:  OSA  History of Present Illness:    [name] is a 55 y.o. male with with a hx of NICM suspected to be ETOH related, HTN, PAF and hx of OSA and has been on BiPAP.  He was referred to me to establish sleep care a year ago. He has a hx of severe OSA with a sleep study showing no REM sleep and an AHI of > 70/hr.  He was initially started on CPAP and then changed to BiPAP.  He has had several sleep studies done in [place] and then [place] and he thinks also in [place].   He uses [facility].    He is also on 2L of O2 with his BiPAP.    He is back today after getting a new PAP device because his got damaged beyond repair.  He is doing well with his CPAP device and thinks that he has gotten used to it.  He tolerates the mask and feels the pressure is adequate.  Since going on CPAP he feels rested in the am and has no significant daytime  sleepiness.  He denies any significant mouth or nasal dryness or nasal congestion.  He does not think that he snores.    The patient does not have symptoms concerning for COVID-19 infection (fever, chills, cough, or new shortness of breath).    Past Medical History:  Diagnosis Date  . Alcohol abuse   . Anxiety state, unspecified   . Atrial fibrillation ([place])   . CHF (congestive heart failure) ([place])   . Chronic systolic heart failure ([place])   . Diabetes mellitus, type II ([place])   . Edema   . Gout   . History of medication noncompliance   . Migraine   . Obesity, unspecified   . Obstructive sleep apnea   . Psychiatric disorder   . Pulmonary embolism ([place])   . Shortness of breath    Past Surgical History:  Procedure Laterality Date  . CARDIAC CATHETERIZATION    . CARDIAC CATHETERIZATION N/A 08/13/2015   Procedure: Right/Left Heart Cath and Coronary Angiography;  Surgeon: [name], MD;  Location: [facility];  Service: Cardiovascular;  Laterality: N/A;  . RIGHT/LEFT HEART CATH AND CORONARY ANGIOGRAPHY N/A 04/14/2017   Procedure: Right/Left Heart Cath and Coronary Angiography;  Surgeon: [name], MD;  Location: [place] [facility];  Service: Cardiovascular;  Laterality: N/A;  .  TESTICLE SURGERY       Current Meds  Medication Sig  . ARIPiprazole (ABILIFY) 10 MG tablet Take 1 tablet (10 mg total) by mouth daily.  Marland Kitchen atorvastatin (LIPITOR) 40 MG tablet Take 1 tablet (40 mg total) by mouth daily.  . blood glucose meter kit and supplies KIT Use to check blood sugar. DX E11.9  . clonazePAM (KLONOPIN) 1 MG tablet Take 1 tablet (1 mg total) by mouth 2 (two) times daily as needed for anxiety.  Marland Kitchen FARXIGA 10 MG TABS tablet TAKE 1 TABLET BY MOUTH EVERY DAY  . metoprolol succinate (TOPROL-XL) 100 MG 24 hr tablet TAKE 1 TABLET (100 MG TOTAL) BY MOUTH DAILY. TAKE WITH OR IMMEDIATELY FOLLOWING A MEAL.  . pantoprazole (PROTONIX) 40 MG tablet TAKE 1 TABLET BY MOUTH EVERY DAY  .  potassium chloride SA (KLOR-CON) 20 MEQ tablet Take 40 mEq by mouth daily as needed.  Marland Kitchen PRESCRIPTION MEDICATION Inhale into the lungs See admin instructions. Bipap, pressure 16/12 with 2L of O2 - use whenever sleeping  . rivaroxaban (XARELTO) 20 MG TABS tablet Take 1 tablet (20 mg total) by mouth daily.  . sertraline (ZOLOFT) 100 MG tablet Take 1 tablet (100 mg total) by mouth daily.  . sildenafil (REVATIO) 20 MG tablet Take 4 tablets (80 mg total) by mouth daily as needed.  Marland Kitchen spironolactone (ALDACTONE) 25 MG tablet TAKE 1 TABLET (25 MG TOTAL) BY MOUTH DAILY. *MUST KEEP 12/20 APPOINTMENT FOR REFILLS*  . thiamine 100 MG tablet Take 1 tablet (100 mg total) by mouth daily.  Marland Kitchen torsemide (DEMADEX) 20 MG tablet Take 20-30 mg by mouth daily.  Marland Kitchen VASCEPA 1 g capsule TAKE 2 CAPSULES (2 G TOTAL) BY MOUTH 2 (TWO) TIMES DAILY.     Allergies:   Ace inhibitors, Bidil [isosorb dinitrate-hydralazine], Digoxin and related, and Buspirone   Social History   Tobacco Use  . Smoking status: Former Smoker    Packs/day: 0.50    Years: 30.00    Pack years: 15.00    Types: Cigarettes    Quit date: 05/15/2020    Years since quitting: 0.7  . Smokeless tobacco: Never Used  . Tobacco comment: vaping - nicotine-free products  Vaping Use  . Vaping Use: Never used  Substance Use Topics  . Alcohol use: Not Currently    Alcohol/week: 0.0 standard drinks  . Drug use: No     Family Hx: The patient's family history includes Cancer in his mother; Diabetes in his daughter, father, and another family member; Heart disease in his maternal grandmother; Hypertension in his mother and another family member; Stroke in an other family member.  ROS:   Please see the history of present illness.     All other systems reviewed and are negative.   Prior CV studies:   The following studies were reviewed today:  PAP compliance download  Labs/Other Tests and Data Reviewed:    EKG:  No ECG reviewed.  Recent  Labs: 03/21/2020: TSH 1.27 11/19/2020: ALT 28; BUN 14; Creatinine, Ser 1.08; Hemoglobin 15.9; Platelets 243.0; Potassium 3.5; Sodium 138   Recent Lipid Panel Lab Results  Component Value Date/Time   CHOL 226 (H) 11/19/2020 02:54 PM   TRIG 133.0 11/19/2020 02:54 PM   HDL 35.00 (L) 11/19/2020 02:54 PM   CHOLHDL 6 11/19/2020 02:54 PM   LDLCALC 164 (H) 11/19/2020 02:54 PM   LDLDIRECT 114.0 03/21/2020 02:55 PM    Wt Readings from Last 3 Encounters:  02/18/21 (!) 380 lb (172.4 kg)  12/07/20 (!) 379 lb 12.8 oz (172.3 kg)  12/03/20 (!) 387 lb (175.5 kg)     Risk Assessment/Calculations:      Objective:    Vital Signs:  Ht _0  (1.753 m)   Wt (!) 380 lb (172.4 kg)   BMI 56.12 kg/m    VITAL SIGNS:  reviewed GEN:  no acute distress EYES:  sclerae anicteric, EOMI - Extraocular Movements Intact RESPIRATORY:  normal respiratory effort, symmetric expansion CARDIOVASCULAR:  no peripheral edema SKIN:  no rash, lesions or ulcers. MUSCULOSKELETAL:  no obvious deformities. NEURO:  alert and oriented x 3, no obvious focal deficit PSYCH:  normal affect  ASSESSMENT & PLAN:    OSA -  The patient is tolerating PAP therapy well without any problems. The PAP download was reviewed today and showed an AHI of 3.1/hr on auto BiPAP cm H2O with 90%% compliance in using more than 4 hours nightly.  The patient has been using and benefiting from PAP use and will continue to benefit from therapy.   2.  HTN -BP controlled on exam today -continue Losartan, Toprol and spiro       COVID-19 Education: The signs and symptoms of COVID-19 were discussed with the patient and how to seek care for testing (follow up with PCP or arrange E-visit).  The importance of social distancing was discussed today.  Time:   Today, I have spent 20 minutes with the patient with telehealth technology discussing the above problems.     Medication Adjustments/Labs and Tests Ordered: Current medicines are reviewed at  length with the patient today.  Concerns regarding medicines are outlined above.   Tests Ordered: No orders of the defined types were placed in this encounter.   Medication Changes: No orders of the defined types were placed in this encounter.   Follow Up:  In Person in 1 year(s)  Signed, Fransico Him, MD  02/18/2021 10:40 AM    [place]

## 2021-02-20 DIAGNOSIS — G4733 Obstructive sleep apnea (adult) (pediatric): Secondary | ICD-10-CM | POA: Diagnosis not present

## 2021-02-22 ENCOUNTER — Other Ambulatory Visit: Payer: Self-pay

## 2021-02-22 ENCOUNTER — Encounter (HOSPITAL_COMMUNITY): Payer: Self-pay | Admitting: Cardiology

## 2021-02-22 ENCOUNTER — Ambulatory Visit (HOSPITAL_COMMUNITY)
Admission: RE | Admit: 2021-02-22 | Discharge: 2021-02-22 | Disposition: A | Discharge status: Home / Self Care | Payer: Medicare HMO | Source: Ambulatory Visit | Attending: Cardiology | Admitting: Cardiology

## 2021-02-22 VITALS — BP 118/68 | HR 59 | Wt 384.2 lb

## 2021-02-22 DIAGNOSIS — Z87891 Personal history of nicotine dependence: Secondary | ICD-10-CM | POA: Diagnosis not present

## 2021-02-22 DIAGNOSIS — R519 Headache, unspecified: Secondary | ICD-10-CM | POA: Insufficient documentation

## 2021-02-22 DIAGNOSIS — I5042 Chronic combined systolic (congestive) and diastolic (congestive) heart failure: Secondary | ICD-10-CM

## 2021-02-22 DIAGNOSIS — Z7984 Long term (current) use of oral hypoglycemic drugs: Secondary | ICD-10-CM | POA: Diagnosis not present

## 2021-02-22 DIAGNOSIS — E785 Hyperlipidemia, unspecified: Secondary | ICD-10-CM | POA: Diagnosis not present

## 2021-02-22 DIAGNOSIS — G4733 Obstructive sleep apnea (adult) (pediatric): Secondary | ICD-10-CM | POA: Insufficient documentation

## 2021-02-22 DIAGNOSIS — I428 Other cardiomyopathies: Secondary | ICD-10-CM | POA: Diagnosis not present

## 2021-02-22 DIAGNOSIS — I5022 Chronic systolic (congestive) heart failure: Secondary | ICD-10-CM | POA: Diagnosis not present

## 2021-02-22 DIAGNOSIS — Z8249 Family history of ischemic heart disease and other diseases of the circulatory system: Secondary | ICD-10-CM | POA: Diagnosis not present

## 2021-02-22 DIAGNOSIS — Z79899 Other long term (current) drug therapy: Secondary | ICD-10-CM | POA: Insufficient documentation

## 2021-02-22 DIAGNOSIS — I480 Paroxysmal atrial fibrillation: Secondary | ICD-10-CM | POA: Insufficient documentation

## 2021-02-22 DIAGNOSIS — E118 Type 2 diabetes mellitus with unspecified complications: Secondary | ICD-10-CM | POA: Diagnosis not present

## 2021-02-22 DIAGNOSIS — I110 Hypertensive heart disease with heart failure: Secondary | ICD-10-CM | POA: Insufficient documentation

## 2021-02-22 DIAGNOSIS — E781 Pure hyperglyceridemia: Secondary | ICD-10-CM

## 2021-02-22 DIAGNOSIS — Z7901 Long term (current) use of anticoagulants: Secondary | ICD-10-CM | POA: Diagnosis not present

## 2021-02-22 DIAGNOSIS — E669 Obesity, unspecified: Secondary | ICD-10-CM | POA: Diagnosis not present

## 2021-02-22 DIAGNOSIS — F419 Anxiety disorder, unspecified: Secondary | ICD-10-CM | POA: Insufficient documentation

## 2021-02-22 LAB — CBC
HCT: 45.4 % (ref 39.0–52.0)
Hemoglobin: 16 g/dL (ref 13.0–17.0)
MCH: 29.4 pg (ref 26.0–34.0)
MCHC: 35.2 g/dL (ref 30.0–36.0)
MCV: 83.3 fL (ref 80.0–100.0)
Platelets: 245 10*3/uL (ref 150–400)
RBC: 5.45 MIL/uL (ref 4.22–5.81)
RDW: 13.4 % (ref 11.5–15.5)
WBC: 6.9 10*3/uL (ref 4.0–10.5)
nRBC: 0 % (ref 0.0–0.2)

## 2021-02-22 LAB — BASIC METABOLIC PANEL
Anion gap: 8 (ref 5–15)
BUN: 14 mg/dL (ref 6–20)
CO2: 26 mmol/L (ref 22–32)
Calcium: 9.2 mg/dL (ref 8.9–10.3)
Chloride: 104 mmol/L (ref 98–111)
Creatinine, Ser: 0.99 mg/dL (ref 0.61–1.24)
GFR, Estimated: >60 mL/min (ref >60)
Glucose, Bld: 117 mg/dL — ABNORMAL HIGH (ref 70–99)
Potassium: 3.8 mmol/L (ref 3.5–5.1)
Sodium: 138 mmol/L (ref 135–145)

## 2021-02-22 LAB — LIPID PANEL
Cholesterol: 157 mg/dL (ref 0–200)
HDL: 37 mg/dL — ABNORMAL LOW (ref >40)
LDL Cholesterol: 101 mg/dL — ABNORMAL HIGH (ref 0–99)
Total CHOL/HDL Ratio: 4.2 RATIO
Triglycerides: 93 mg/dL (ref <150)
VLDL: 19 mg/dL (ref 0–40)

## 2021-02-22 LAB — HEMOGLOBIN A1C
Hgb A1c MFr Bld: 5.8 % — ABNORMAL HIGH (ref 4.8–5.6)
Mean Plasma Glucose: 119.76 mg/dL

## 2021-02-22 MED ORDER — LOSARTAN POTASSIUM 25 MG PO TABS: 12.5000 mg | ORAL_TABLET | Freq: Every day | ORAL | 6 refills | Status: DC

## 2021-02-22 NOTE — Patient Instructions (Addendum)
Start Losartan 12.5 mg (1/2 tab) Daily at night  Labs done today, your results will be available in MyChart, we will contact you for abnormal readings.  Your physician recommends that you return for lab work in: 1-2 weeks, we have provided you a prescription to have this done locally  You have been referred to the [facility], please call them to schedule a consultation at [phone]  Please call our office in June to schedule your echocardiogram and follow up appointment  Do the following things EVERYDAY: 1) Weigh yourself in the morning before breakfast. Write it down and keep it in a log. 2) Take your medicines as prescribed 3) Eat low salt foods--Limit salt (sodium) to 2000 mg per day.  4) Stay as active as you can everyday 5) Limit all fluids for the day to less than 2 liters  If you have any questions or concerns before your next appointment please send [place] a message through [place] or call our office at [phone].    TO LEAVE A MESSAGE FOR THE NURSE SELECT OPTION 2, PLEASE LEAVE A MESSAGE INCLUDING: . YOUR NAME . DATE OF BIRTH . CALL BACK NUMBER . REASON FOR CALL**this is important as we prioritize the call backs  [place] AS LONG AS YOU CALL BEFORE 4:00 PM  At the [facility], you and your health needs are our priority. As part of our continuing mission to provide you with exceptional heart care, we have created designated Provider Care Teams. These Care Teams include your primary Cardiologist (physician) and Advanced Practice Providers (APPs- Physician Assistants and Nurse Practitioners) who all work together to provide you with the care you need, when you need it.   You may see any of the following providers on your designated Care Team at your next follow up: Marland Kitchen Dr Glori Bickers . Dr [name] . Dr Vickki Muff . [name], NP . Lyda Jester, [place] . Audry Riles,  PharmD   Please be sure to bring in all your medications bottles to every appointment.

## 2021-02-24 NOTE — Progress Notes (Signed)
Advanced Heart Failure Clinic Note   Primary Care:Dr. Scarlette Calico Primary Cardiologist: Dr. Aundra Dubin   HPI: [name] is a 55 y.o. male with a past medical history of NICM, EF 25-30% in March 2018, felt to be related to prior ETOH abuse. He also has a history of PE 03/2014 completed a years course of Xarelto, morbid obesity, OSA, and tobacco abuse.   He was admitted 11/12/15-11/14/15 with acute on chronic systolic CHF and palpitations. He wore a 30 day event monitor at discharge as he had frequent PVC's and questionable Afib on telemetry. Also with some NSVT, so he was started on Amiodaone, but at follow up had not started taking it. He had previously refused ICD and was seen inpatient by Dr. Rayann Heman who felt that his morbid obesity was a prohibitive factor.   He was seen in the clinic in April 2018. He had started drinking ETOH again. Volume status was stable, he is not an Entresto candidate due to angioedema with lisinopril. Weight was 411 pounds.   Admitted 04/12/17 with SOB, chest pain. D- dimer was 1.16, chest CT without central obstructing PE, however more peripheral and subsegmental pulmonary artery branches were not confidently evaluated due to his body habitus. He was started on a heparin gtt for presumed PE, however VQ scan showed no PE. Troponin was elevated, peaked at 3.37. LHC showed no CAD. Echo showed an EF of 15%, grade 2 DD, no pericarditis. He was diuresed with IV lasix, and started on torsemide 6m at discharge. Discharge weight was 405 pounds.   Admitted 5/22 through 04/24/2018 with abdominal pain, nausea, and vomiting. 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 Did not require surgical intervention. He will have follow up with GI.   Echo in 2/21 with EF 20-25%, severe LV dilation.   Today he returns for HF follow up. Using Bipap at night. No dyspnea walking on flat ground.  Notes bendopnea.  No chest pain.  No orthopnea/PND.  Weight down 2 lbs. He has quit smoking for about 4 months.  He  rarely drinks ETOH.  [place] has remarried and now lives in [place]   ECG (personally reviewed): NSR, IVCD 138 msec  Labs (5/13): K 4.1, creatinine 1.05 Labs (1/14): K 3.8, creatinine 1.16, BNP 54 Labs (2/14): K 3.7, creatinine 1.11, BNP 28 Labs (2/16): K 4.1, creatinine 1.03, LDL 96, HCT 40 Labs (3/16): K 3.7, K 1.13, BNP 367 Labs (8/16): BNP 43, K 3.5, creatinine 1.01 Labs (08/13/15): K 3.7, creatinine 1.18, HCT 41.3 Labs (12/16): K 3.7, creatinine 1.14, TGs 495, digoxin < 0.2 Labs (2/17): K 3.8, creatinine 0.99, LDL 107, TGs 222 Labs (5/17): K 4, creatinine 1.47 Labs (2/18): K 3.9, creatinine 1.06, hgb 15.1, TGs 163, LDL 89, HDL 28, TSH normal  Labs (5/18): K 3.8, creatinine 1.12.  Labs (12/30/2017): K 3.8 Creatinine 1.25  Labs (01/28/2018): K 3.8 Creatinine 1.08 Labs 03/19/2018: K 4.1 Creatinine 1/15 BNP 212  Labs 04/29/3018: Creatinine 1.1  Labs (10/19): LDL 166, K 3.8, creatinine 1.01, AST 102 => 25, ALT 125 => 37, alkaline phosphatase 233 Labs (11/20): LDL 106, TGs 232 Labs (2/21): K 3.3, creatinine 1.3 Labs (4/21): LDL 114, TGs 466, K 3.6, creatinine 1.22 Labs (12/21): K 3.5, creatinine 1.08, LDL 164, TGs 133  PMH: 1. Nonischemic cardiomyopathy: Prior cath with no significant CAD.  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  Prior echoes with EF as low as 25%.  Echo (9/13) with EF 35-40%, moderate to severe LV  dilation, diffuse hypokinesis, mild MR. Echo (5/15) with EF 30-35%, moderate to severe LAE, normal RV size and systolic function.  Angioedema with ACEI, headaches with hydralazine/nitrates. Echo (3/16) with EF 25-30%, severe LV dilation, normal RV size and systolic function.  [facility] 08/13/15 showed no significant coronary disease; RA mean 6, PA 33/11 mean 23, PCWP mean 13, Fick CO/CI 4.75 /1.68 (difficult study, radial artery spasm, if needs future cath would use groin).  Echo (9/16) showed EF 20-25%.   - Echo (3/18): EF 25-30%, moderate LAE -  Echo 4/18 EF 15% - Echo 7/19 EF 20-25% - Echo 2/21 with EF 20-25%, severe LV dilation.  2. HTN: angioedema with ACEI.  3. OSA: on Bipap 4. Morbid obesity 5. Paroxysmal atrial fibrillation: Not documented recently.   6. Smoker.  7. Anxiety/panic attacks 8. PE: 5/15, diagnosed by V/Q scan. CTA chest 8/16 negative for PE.  9. NSVT, PVCs: 30 day monitor (12/16) with PVCs, PACs, no atrial fibrillation.  - Zio patch (9/19): few short NSVT runs, no atrial fibrillation, 1.1% PVCs 10. Hematuria: Apparently had negative workup by urology.  11. ABIs (6/16) were normal 12. 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  13. Gout 14. Low back pain.   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  Has son and daughter.    FH: No premature CAD.    Review of systems complete and found to be negative unless listed in HPI.    Current Outpatient Medications  Medication Sig Dispense Refill  . ARIPiprazole (ABILIFY) 10 MG tablet Take 1 tablet (10 mg total) by mouth daily. 90 tablet 0  . atorvastatin (LIPITOR) 40 MG tablet Take 1 tablet (40 mg total) by mouth daily. 90 tablet 1  . blood glucose meter kit and supplies KIT Use to check blood sugar. DX E11.9 1 each 0  . clonazePAM (KLONOPIN) 1 MG tablet Take 1 tablet (1 mg total) by mouth 2 (two) times daily as needed for anxiety. 60 tablet 1  . FARXIGA 10 MG TABS tablet TAKE 1 TABLET BY MOUTH EVERY DAY 90 tablet 1  . losartan (COZAAR) 25 MG tablet Take 0.5 tablets (12.5 mg total) by mouth at bedtime. 15 tablet 6  . metoprolol succinate (TOPROL-XL) 100 MG 24 hr tablet TAKE 1 TABLET (100 MG TOTAL) BY MOUTH DAILY. TAKE WITH OR IMMEDIATELY FOLLOWING A MEAL. 90 tablet 3  . pantoprazole (PROTONIX) 40 MG tablet TAKE 1 TABLET BY MOUTH EVERY DAY 90 tablet 1  . potassium chloride SA (KLOR-CON) 20 MEQ tablet Take 40 mEq by mouth daily as needed.    Marland Kitchen PRESCRIPTION MEDICATION Inhale into the lungs See admin instructions.  Bipap, pressure 16/12 with 2L of O2 - use whenever sleeping    . rivaroxaban (XARELTO) 20 MG TABS tablet Take 1 tablet (20 mg total) by mouth daily. 90 tablet 1  . sertraline (ZOLOFT) 100 MG tablet Take 1 tablet (100 mg total) by mouth daily. 90 tablet 0  . sildenafil (REVATIO) 20 MG tablet Take 4 tablets (80 mg total) by mouth daily as needed. 60 tablet 5  . spironolactone (ALDACTONE) 25 MG tablet TAKE 1 TABLET (25 MG TOTAL) BY MOUTH DAILY. *MUST KEEP 12/20 APPOINTMENT FOR REFILLS* 90 tablet 1  . thiamine 100 MG tablet Take 1 tablet (100 mg total) by mouth daily. 90 tablet 1  . torsemide (DEMADEX) 20 MG tablet Take 20-30 mg by mouth daily.     No current facility-administered medications for  this encounter.    Allergies  Allergen Reactions  . Ace Inhibitors Anaphylaxis and Swelling    Angioedema  . Bidil [Isosorb Dinitrate-Hydralazine] Other (See Comments)    headache  . Digoxin And Related     Unspecified "side effects"  . Buspirone Other (See Comments)    dizziness      Social History   Socioeconomic History  . Marital status: Married    Spouse name: Not on file  . Number of children: 3  . Years of education: Not on file  . Highest education level: Not on file  Occupational History  . Occupation: DISABLED  Tobacco Use  . Smoking status: Former Smoker    Packs/day: 0.50    Years: 30.00    Pack years: 15.00    Types: Cigarettes    Quit date: 05/15/2020    Years since quitting: 0.7  . Smokeless tobacco: Never Used  . Tobacco comment: vaping - nicotine-free products  Vaping Use  . Vaping Use: Never used  Substance and Sexual Activity  . Alcohol use: Not Currently    Alcohol/week: 0.0 standard drinks  . Drug use: No  . Sexual activity: Not Currently  Other Topics Concern  . Not on file  Social History Narrative   He smokes about a pack per day and he has been smoking since he was 55 years of age.  He drinks alcohol occasionally, but he denies any illicit drug abuse.   He is presently on disability.    Lives with wife in a 2 story home.  Has 2 children.   Previously worked in [employer], last worked in 1998.   Highest level of education:  11th grade           Social Determinants of Health   Financial Resource Strain: Low Risk   . Difficulty of Paying Living Expenses: Not hard at all  Food Insecurity: No Food Insecurity  . Worried About Charity fundraiser in the Last Year: Never true  . Ran Out of Food in the Last Year: Never true  Transportation Needs: No Transportation Needs  . Lack of Transportation (Medical): No  . Lack of Transportation (Non-Medical): No  Physical Activity: Sufficiently Active  . Days of Exercise per Week: 5 days  . Minutes of Exercise per Session: 30 min  Stress: No Stress Concern Present  . Feeling of Stress : Not at all  Social Connections: Moderately Isolated  . Frequency of Communication with Friends and Family: More than three times a week  . Frequency of Social Gatherings with Friends and Family: Never  . Attends Religious Services: Never  . Active Member of Clubs or Organizations: No  . Attends Archivist Meetings: Never  . Marital Status: Married  Human resources officer Violence: Not on file      Family History  Problem Relation Age of Onset  . Cancer Mother        brain tumor  . Hypertension Mother   . Diabetes Father        Deceased, 46  . Heart disease Maternal Grandmother   . Hypertension Other        Family History  . Stroke Other        Family History  . Diabetes Other        Family History  . Diabetes Daughter     Vitals:   02/22/21 1156  BP: 118/68  Pulse: (!) 59  SpO2: 98%  Weight: (!) 174.3 kg (384 lb 3.2 oz)  Filed Weights   02/22/21 1156  Weight: (!) 174.3 kg (384 lb 3.2 oz)   Wt Readings from Last 3 Encounters:  02/22/21 (!) 174.3 kg (384 lb 3.2 oz)  02/18/21 (!) 172.4 kg (380 lb)  12/07/20 (!) 172.3 kg (379 lb 12.8 oz)    PHYSICAL EXAM: General: NAD, obese Neck:  Thick. No JVD, no thyromegaly or thyroid nodule.  Lungs: Clear to auscultation bilaterally with normal respiratory effort. CV: Nondisplaced PMI.  Heart regular S1/S2, no S3/S4, no murmur.  Trace ankle edema.  No carotid bruit.  Normal pedal pulses.  Abdomen: Soft, nontender, no hepatosplenomegaly, no distention.  Skin: Intact without lesions or rashes.  Neurologic: Alert and oriented x 3.  Psych: Normal affect. Extremities: No clubbing or cyanosis.  HEENT: Normal.   ASSESSMENT & PLAN: 1. Chronic systolic CHF: Nonischemic cardiomyopathy. ?If ETOH has played a role (now drinks rarely). Echo (3/18) with EF 25-30%. He was seen by EP and decided against ICD given marked obesity.  QRS not wide enough for CRT. Echo 7/19 and in 2/21 with EF 20-25%. NYHA class II symptoms. He does not look volume overloaded on exam.  - Continue torsemide 20 mg daily. BMET today.  - Continue spironolactone 25 mg daily - Continue Toprol XL 100 daily.  - Continue dapagliflozin 10 mg daily.  - No entresto with h/o angioedema.  - Start back on losartan at low dose, 12.5 mg daily.  He did not tolerate a higher dose in the past.  BMET 10 days.  - Had side effects from digoxin, does not want to take. - Headaches with Bidil, cannot take.  - Echo at followup appt.  2. Obesity: Continue to work on weight loss.  3. Smoking: He has quit smoking.  4. OSA: Continue nightly BiPAP. 5. Paroxysmal atrial fibrillation: None documented recently.  He is on Xarelto.  6. PE: 04/14/2018, diagnosed by V/Q scan. He remains on Xarelto.  7. Anxiety/Panic attacks/depression: Followed by psychiatry.  8. Hyperlipidemia: Continue atorvastatin.  Check lipids today.    Followup in 3 months with echo.     [name] 02/24/2021

## 2021-03-04 ENCOUNTER — Telehealth: Payer: Self-pay | Admitting: Pharmacist

## 2021-03-04 NOTE — Progress Notes (Addendum)
Chronic Care Management Pharmacy Assistant   Name: DERREL MOORE  MRN: [id number] DOB: 23-Jun-1966   Reason for Encounter: Hypertension Disease State Call    Recent office visits:  None ID  Recent consult visits:  None ID  Hospital visits:  None in previous 6 months  Medications: Outpatient Encounter Medications as of 03/04/2021  Medication Sig   ARIPiprazole (ABILIFY) 10 MG tablet Take 1 tablet (10 mg total) by mouth daily.   atorvastatin (LIPITOR) 40 MG tablet Take 1 tablet (40 mg total) by mouth daily.   blood glucose meter kit and supplies KIT Use to check blood sugar. DX E11.9   clonazePAM (KLONOPIN) 1 MG tablet Take 1 tablet (1 mg total) by mouth 2 (two) times daily as needed for anxiety.   FARXIGA 10 MG TABS tablet TAKE 1 TABLET BY MOUTH EVERY DAY   losartan (COZAAR) 25 MG tablet Take 0.5 tablets (12.5 mg total) by mouth at bedtime.   metoprolol succinate (TOPROL-XL) 100 MG 24 hr tablet TAKE 1 TABLET (100 MG TOTAL) BY MOUTH DAILY. TAKE WITH OR IMMEDIATELY FOLLOWING A MEAL.   pantoprazole (PROTONIX) 40 MG tablet TAKE 1 TABLET BY MOUTH EVERY DAY   potassium chloride SA (KLOR-CON) 20 MEQ tablet Take 40 mEq by mouth daily as needed.   PRESCRIPTION MEDICATION Inhale into the lungs See admin instructions. Bipap, pressure 16/12 with 2L of O2 - use whenever sleeping   rivaroxaban (XARELTO) 20 MG TABS tablet Take 1 tablet (20 mg total) by mouth daily.   sertraline (ZOLOFT) 100 MG tablet Take 1 tablet (100 mg total) by mouth daily.   sildenafil (REVATIO) 20 MG tablet Take 4 tablets (80 mg total) by mouth daily as needed.   spironolactone (ALDACTONE) 25 MG tablet TAKE 1 TABLET (25 MG TOTAL) BY MOUTH DAILY. *MUST KEEP 12/20 APPOINTMENT FOR REFILLS*   thiamine 100 MG tablet Take 1 tablet (100 mg total) by mouth daily.   torsemide (DEMADEX) 20 MG tablet Take 20-30 mg by mouth daily.   [DISCONTINUED] pravastatin (PRAVACHOL) 40 MG tablet Take 40 mg by mouth daily.   No  facility-administered encounter medications on file as of 03/04/2021.    Reviewed chart prior to disease state call. Spoke with patient regarding BP  Recent Office Vitals: BP Readings from Last 3 Encounters:  02/22/21 118/68  12/07/20 110/70  11/19/20 132/86   Pulse Readings from Last 3 Encounters:  02/22/21 (!) 59  12/07/20 70  11/19/20 80    Wt Readings from Last 3 Encounters:  02/22/21 (!) 384 lb 3.2 oz (174.3 kg)  02/18/21 (!) 380 lb (172.4 kg)  12/07/20 (!) 379 lb 12.8 oz (172.3 kg)     Kidney Function Lab Results  Component Value Date/Time   CREATININE 0.99 02/22/2021 12:11 PM   CREATININE 1.08 11/19/2020 02:54 PM   CREATININE 1.18 08/24/2017 10:59 AM   CREATININE 1.06 01/15/2017 12:11 PM   GFR 79.10 11/19/2020 02:54 PM   GFRNONAA >60 02/22/2021 12:11 PM   GFRAA >60 05/15/2020 02:52 PM    BMP Latest Ref Rng & Units 02/22/2021 11/19/2020 05/15/2020  Glucose 70 - 99 mg/dL 117(H) 71 79  BUN 6 - 20 mg/dL 14 14 15   Creatinine 0.61 - 1.24 mg/dL 0.99 1.08 1.06  BUN/Creat Ratio 6 - 22 (calc) - - -  Sodium 135 - 145 mmol/L 138 138 138  Potassium 3.5 - 5.1 mmol/L 3.8 3.5 3.7  Chloride 98 - 111 mmol/L 104 104 105  CO2 22 - 32 mmol/L  26 24 24   Calcium 8.9 - 10.3 mg/dL 9.2 9.2 8.9    Current antihypertensive regimen:  Losartan 25 mg daily Metoprolol succinate 100 mg daily Spironolactone 25 mg daily Torsemide 20 mg PRN  How often are you checking your Blood Pressure? The patient states that he tries to take blood pressure every other day  Current home BP readings: The patient states that his last reading was 128/77  What recent interventions/DTPs have been made by any provider to improve Blood Pressure control since last CPP Visit: The patient was started on Losartan 25 mg by Dr. Aundra Dubin - has not picked up yet.  Any recent hospitalizations or ED visits since last visit with CPP? No  What diet changes have been made to improve Blood Pressure Control?  The patient  stated that he is watching salt intake  What exercise is being done to improve your Blood Pressure Control?  The patient states that he is moving around more   Star Rating Drugs: Losartan 25 mg 1/2 tab at bedtime; last filled 02/22/21 patient has not yet started, states he will pick up today Atorvastatin 40 mg 1 tab daily; last filled 03/02/21, patient has not picked up yet   [place] Pharmacist Assistant [phone]

## 2021-03-07 ENCOUNTER — Other Ambulatory Visit (HOSPITAL_COMMUNITY): Payer: Self-pay | Admitting: Psychiatry

## 2021-03-07 DIAGNOSIS — F3175 Bipolar disorder, in partial remission, most recent episode depressed: Secondary | ICD-10-CM

## 2021-03-18 DIAGNOSIS — G4733 Obstructive sleep apnea (adult) (pediatric): Secondary | ICD-10-CM | POA: Diagnosis not present

## 2021-03-18 DIAGNOSIS — I5042 Chronic combined systolic (congestive) and diastolic (congestive) heart failure: Secondary | ICD-10-CM | POA: Diagnosis not present

## 2021-03-18 DIAGNOSIS — I2699 Other pulmonary embolism without acute cor pulmonale: Secondary | ICD-10-CM | POA: Diagnosis not present

## 2021-03-21 ENCOUNTER — Encounter (HOSPITAL_COMMUNITY): Payer: Self-pay | Admitting: Psychiatry

## 2021-03-21 ENCOUNTER — Other Ambulatory Visit: Payer: Self-pay

## 2021-03-21 ENCOUNTER — Telehealth (INDEPENDENT_AMBULATORY_CARE_PROVIDER_SITE_OTHER): Payer: Medicare HMO | Admitting: Psychiatry

## 2021-03-21 DIAGNOSIS — F410 Panic disorder [episodic paroxysmal anxiety] without agoraphobia: Secondary | ICD-10-CM | POA: Diagnosis not present

## 2021-03-21 DIAGNOSIS — Z630 Problems in relationship with spouse or partner: Secondary | ICD-10-CM | POA: Diagnosis not present

## 2021-03-21 DIAGNOSIS — R69 Illness, unspecified: Secondary | ICD-10-CM | POA: Diagnosis not present

## 2021-03-21 DIAGNOSIS — F3175 Bipolar disorder, in partial remission, most recent episode depressed: Secondary | ICD-10-CM

## 2021-03-21 MED ORDER — ARIPIPRAZOLE 10 MG PO TABS: 10.0000 mg | ORAL_TABLET | Freq: Every day | ORAL | 0 refills | Status: DC

## 2021-03-21 MED ORDER — CLONAZEPAM 1 MG PO TABS: 1.0000 mg | ORAL_TABLET | Freq: Two times a day (BID) | ORAL | 1 refills | Status: DC | PRN

## 2021-03-21 MED ORDER — SERTRALINE HCL 100 MG PO TABS
100.0000 mg | ORAL_TABLET | Freq: Every day | ORAL | 0 refills | Status: DC
Start: 2021-03-21 — End: 2021-05-20

## 2021-03-21 NOTE — Progress Notes (Signed)
[place] MD/PA/NP OP Progress Note  Virtual Visit via Video Note  I connected with [name] on 03/21/21 at  1:20 PM EDT by a video enabled telemedicine application and verified that I am speaking with the correct person using two identifiers.  Location: Patient: Home Provider: Clinic   I discussed the limitations of evaluation and management by telemedicine and the availability of in person appointments. The patient expressed understanding and agreed to proceed.  I provided 17 minutes of non-face-to-face time during this encounter.      03/21/2021 1:24 PM [name]  MRN:  [id number]  Chief Complaint:  " I moved out yesterday."  HPI: Patient reported that he moved out of his wife's house again yesterday and is currently in [place] in a friend's house.  Patient reported that he keeps getting into altercations with his wife over every little thing and it has gotten too much for him to tolerate.  He stated that he moved out yesterday while she was at work without telling her.  She found out that he had just moved out only after she got back home from work.  She texted him later in the afternoon and stated that she can see that he is gone and that she wishes that their relationship can be cordial and that they can be friends in the future. Patient stated that her 34 children 27-year-old daughter and 79 year old son were also living with them and he did get close to his 61-year-old daughter and treated her like his own. His own 5 year old son has been living with his ex who also has now moved on into a new relationship. He stated that he feels very lonely sitting here by himself without any want to take care of him. He stated that this relationship with his wife has taken a toll on his physical and mental health and that his wife does not really care for his health at all.  He stated that she also has disability for mental health issues which she did not disclose to him earlier.  He  stated that she picks her skin and blames it on him. He stated that he is not going to do anything immediately but may eventually need to go to file for divorce.  He is hoping to be able to spend more time with his 44 year old son.  Writer recommended that we continue the same regimen for now and expressed hope that he makes the right decision after taking everything into consideration.   Visit Diagnosis:    ICD-10-CM   1. Bipolar 1 disorder, depressed, partial remission ([place])  F31.75   2. Panic disorder  F41.0   3. Marital conflict  H88.5     Past Psychiatric History: Bipolar disorder, anxiety, panic attacks  Past Medical History:  Past Medical History:  Diagnosis Date  . Alcohol abuse   . Anxiety state, unspecified   . Atrial fibrillation ([place])   . CHF (congestive heart failure) ([place])   . Chronic systolic heart failure ([place])   . Diabetes mellitus, type II ([place])   . Edema   . Gout   . History of medication noncompliance   . Migraine   . Obesity, unspecified   . Obstructive sleep apnea   . Psychiatric disorder   . Pulmonary embolism ([place])   . Shortness of breath     Past Surgical History:  Procedure Laterality Date  . CARDIAC CATHETERIZATION    . CARDIAC CATHETERIZATION N/A 08/13/2015   Procedure: Right/Left Heart  Cath and Coronary Angiography;  Surgeon: [name], MD;  Location: [facility];  Service: Cardiovascular;  Laterality: N/A;  . RIGHT/LEFT HEART CATH AND CORONARY ANGIOGRAPHY N/A 04/14/2017   Procedure: Right/Left Heart Cath and Coronary Angiography;  Surgeon: [name], MD;  Location: [facility];  Service: Cardiovascular;  Laterality: N/A;  . TESTICLE SURGERY      Family Psychiatric History: History of depression and bipolar in siblings  Family History:  Family History  Problem Relation Age of Onset  . Cancer Mother        brain tumor  . Hypertension Mother   . Diabetes Father        Deceased, 56  . Heart disease Maternal  Grandmother   . Hypertension Other        Family History  . Stroke Other        Family History  . Diabetes Other        Family History  . Diabetes Daughter     Social History:  Social History   Socioeconomic History  . Marital status: Married    Spouse name: Not on file  . Number of children: 3  . Years of education: Not on file  . Highest education level: Not on file  Occupational History  . Occupation: DISABLED  Tobacco Use  . Smoking status: Former Smoker    Packs/day: 0.50    Years: 30.00    Pack years: 15.00    Types: Cigarettes    Quit date: 05/15/2020    Years since quitting: 0.8  . Smokeless tobacco: Never Used  . Tobacco comment: vaping - nicotine-free products  Vaping Use  . Vaping Use: Never used  Substance and Sexual Activity  . Alcohol use: Not Currently    Alcohol/week: 0.0 standard drinks  . Drug use: No  . Sexual activity: Not Currently  Other Topics Concern  . Not on file  Social History Narrative   He smokes about a pack per day and he has been smoking since he was 55 years of age.  He drinks alcohol occasionally, but he denies any illicit drug abuse.  He is presently on disability.    Lives with wife in a 2 story home.  Has 2 children.   Previously worked in [employer], last worked in 1998.   Highest level of education:  11th grade           Social Determinants of Health   Financial Resource Strain: Low Risk   . Difficulty of Paying Living Expenses: Not hard at all  Food Insecurity: No Food Insecurity  . Worried About Charity fundraiser in the Last Year: Never true  . Ran Out of Food in the Last Year: Never true  Transportation Needs: No Transportation Needs  . Lack of Transportation (Medical): No  . Lack of Transportation (Non-Medical): No  Physical Activity: Sufficiently Active  . Days of Exercise per Week: 5 days  . Minutes of Exercise per Session: 30 min  Stress: No Stress Concern Present  . Feeling of Stress : Not at all  Social  Connections: Moderately Isolated  . Frequency of Communication with Friends and Family: More than three times a week  . Frequency of Social Gatherings with Friends and Family: Never  . Attends Religious Services: Never  . Active Member of Clubs or Organizations: No  . Attends Archivist Meetings: Never  . Marital Status: Married    Allergies:  Allergies  Allergen Reactions  .  Ace Inhibitors Anaphylaxis and Swelling    Angioedema  . Bidil [Isosorb Dinitrate-Hydralazine] Other (See Comments)    headache  . Digoxin And Related     Unspecified "side effects"  . Buspirone Other (See Comments)    dizziness    Metabolic Disorder Labs: Lab Results  Component Value Date   HGBA1C 5.8 (H) 02/22/2021   MPG 119.76 02/22/2021   MPG 275 04/12/2019   No results found for: PROLACTIN Lab Results  Component Value Date   CHOL 157 02/22/2021   TRIG 93 02/22/2021   HDL 37 (L) 02/22/2021   CHOLHDL 4.2 02/22/2021   VLDL 19 02/22/2021   LDLCALC 101 (H) 02/22/2021   LDLCALC 164 (H) 11/19/2020   Lab Results  Component Value Date   TSH 1.27 03/21/2020   TSH 1.54 01/19/2019    Therapeutic Level Labs: No results found for: LITHIUM No results found for: VALPROATE No components found for:  CBMZ  Current Medications: Current Outpatient Medications  Medication Sig Dispense Refill  . ARIPiprazole (ABILIFY) 10 MG tablet TAKE 1 TABLET BY MOUTH EVERY DAY 90 tablet 0  . atorvastatin (LIPITOR) 40 MG tablet Take 1 tablet (40 mg total) by mouth daily. 90 tablet 1  . blood glucose meter kit and supplies KIT Use to check blood sugar. DX E11.9 1 each 0  . clonazePAM (KLONOPIN) 1 MG tablet Take 1 tablet (1 mg total) by mouth 2 (two) times daily as needed for anxiety. 60 tablet 1  . FARXIGA 10 MG TABS tablet TAKE 1 TABLET BY MOUTH EVERY DAY 90 tablet 1  . losartan (COZAAR) 25 MG tablet Take 0.5 tablets (12.5 mg total) by mouth at bedtime. 15 tablet 6  . metoprolol succinate (TOPROL-XL) 100 MG  24 hr tablet TAKE 1 TABLET (100 MG TOTAL) BY MOUTH DAILY. TAKE WITH OR IMMEDIATELY FOLLOWING A MEAL. 90 tablet 3  . pantoprazole (PROTONIX) 40 MG tablet TAKE 1 TABLET BY MOUTH EVERY DAY 90 tablet 1  . potassium chloride SA (KLOR-CON) 20 MEQ tablet Take 40 mEq by mouth daily as needed.    Marland Kitchen PRESCRIPTION MEDICATION Inhale into the lungs See admin instructions. Bipap, pressure 16/12 with 2L of O2 - use whenever sleeping    . rivaroxaban (XARELTO) 20 MG TABS tablet Take 1 tablet (20 mg total) by mouth daily. 90 tablet 1  . sertraline (ZOLOFT) 100 MG tablet Take 1 tablet (100 mg total) by mouth daily. 90 tablet 0  . sildenafil (REVATIO) 20 MG tablet Take 4 tablets (80 mg total) by mouth daily as needed. 60 tablet 5  . spironolactone (ALDACTONE) 25 MG tablet TAKE 1 TABLET (25 MG TOTAL) BY MOUTH DAILY. *MUST KEEP 12/20 APPOINTMENT FOR REFILLS* 90 tablet 1  . thiamine 100 MG tablet Take 1 tablet (100 mg total) by mouth daily. 90 tablet 1  . torsemide (DEMADEX) 20 MG tablet Take 20-30 mg by mouth daily.     No current facility-administered medications for this visit.       Psychiatric Specialty Exam: Review of Systems  There were no vitals taken for this visit.There is no height or weight on file to calculate BMI.  General Appearance: Fairly Groomed  Eye Contact:  Good  Speech:  Clear and Coherent and Normal Rate  Volume:  Normal  Mood: Slightly Irritable  Affect:  Congruent  Thought Process:  Coherent  Orientation:  Full (Time, Place, and Person)  Thought Content: WDL   Suicidal Thoughts:  No  Homicidal Thoughts:  No  Memory:  Immediate;   Good Recent;   Good  Judgement:  Fair  Insight:  Fair  Psychomotor Activity:  Normal  Concentration:  Concentration: Good  Recall:  Good  Fund of Knowledge: Good  Language: Good  Akathisia:  No  Handed:  Right  AIMS (if indicated): not done  Assets:  Communication Skills Desire for Improvement Financial Resources/Insurance Housing Intimacy   ADL's:  Intact  Cognition: WNL  Sleep:  Fair   Screenings: PHQ2-9   Flowsheet Row Clinical Support from 01/15/2021 in [facility] at [facility] Visit from 12/21/2019 in [facility] at [facility] Visit from 10/12/2019 in [facility] Visit from 12/16/2018 in [facility] from 04/29/2018 in [facility]  PHQ-2 Total Score 0 5 5 6  0  PHQ-9 Total Score -- 17 20 23  0       Assessment and Plan: Patient continues to have altercations with his wife and he moved out of their house yesterday and is living with a friend in [place] for now.  He stated that he is done with his relationship and is going file for divorce in the near future  1. Bipolar 1 disorder, depressed, partial remission ([facility])  - ARIPiprazole (ABILIFY) 10 MG tablet; Take 1 tablet (10 mg total) by mouth daily.  Dispense: 90 tablet; Refill: 0 - sertraline (ZOLOFT) 100 MG tablet; Take 1 tablet (100 mg total) by mouth daily.  Dispense: 90 tablet; Refill: 0  2. Panic disorder  - clonazePAM (KLONOPIN) 1 MG tablet; Take 1 tablet (1 mg total) by mouth 2 (two) times daily as needed for anxiety.  Dispense: 60 tablet; Refill: 1 - sertraline (ZOLOFT) 100 MG tablet; Take 1 tablet (100 mg total) by mouth daily.  Dispense: 90 tablet; Refill: 0  3. Marital conflict  Supportive therapy provided during the session. Continue same medications Follow-up in 2 months.    [place] Crane, MD 03/21/2021, 1:24 PM

## 2021-03-23 DIAGNOSIS — G4733 Obstructive sleep apnea (adult) (pediatric): Secondary | ICD-10-CM | POA: Diagnosis not present

## 2021-03-27 ENCOUNTER — Other Ambulatory Visit: Payer: Self-pay | Admitting: Internal Medicine

## 2021-03-27 DIAGNOSIS — I480 Paroxysmal atrial fibrillation: Secondary | ICD-10-CM

## 2021-03-27 DIAGNOSIS — I2699 Other pulmonary embolism without acute cor pulmonale: Secondary | ICD-10-CM

## 2021-04-04 ENCOUNTER — Other Ambulatory Visit: Payer: Self-pay | Admitting: Internal Medicine

## 2021-04-04 DIAGNOSIS — K219 Gastro-esophageal reflux disease without esophagitis: Secondary | ICD-10-CM

## 2021-04-17 ENCOUNTER — Other Ambulatory Visit: Payer: Self-pay

## 2021-04-17 ENCOUNTER — Ambulatory Visit (INDEPENDENT_AMBULATORY_CARE_PROVIDER_SITE_OTHER): Payer: Medicare HMO | Admitting: Pharmacist

## 2021-04-17 DIAGNOSIS — F319 Bipolar disorder, unspecified: Secondary | ICD-10-CM

## 2021-04-17 DIAGNOSIS — I5042 Chronic combined systolic (congestive) and diastolic (congestive) heart failure: Secondary | ICD-10-CM

## 2021-04-17 DIAGNOSIS — I2699 Other pulmonary embolism without acute cor pulmonale: Secondary | ICD-10-CM | POA: Diagnosis not present

## 2021-04-17 DIAGNOSIS — I10 Essential (primary) hypertension: Secondary | ICD-10-CM

## 2021-04-17 DIAGNOSIS — E785 Hyperlipidemia, unspecified: Secondary | ICD-10-CM

## 2021-04-17 DIAGNOSIS — E118 Type 2 diabetes mellitus with unspecified complications: Secondary | ICD-10-CM

## 2021-04-17 DIAGNOSIS — G4733 Obstructive sleep apnea (adult) (pediatric): Secondary | ICD-10-CM | POA: Diagnosis not present

## 2021-04-17 NOTE — Progress Notes (Signed)
Chronic Care Management Pharmacy Note  04/21/2021 Name:  [name] MRN:  [id number] DOB:  November 08, 1966  Subjective: [name] is an 55 y.o. year old male who is a primary patient of [name], MD.  The CCM team was consulted for assistance with disease management and care coordination needs.    Engaged with patient by telephone for follow up visit in response to provider referral for pharmacy case management and/or care coordination services.   Consent to Services:  The patient was given information about Chronic Care Management services, agreed to services, and gave verbal consent prior to initiation of services.  Please see initial visit note for detailed documentation.   Patient Care Team: [name], MD as PCP - General (Internal Medicine) [name], MD as PCP - Cardiology (Cardiology) [name], MD as PCP - Advanced Heart Failure (Cardiology) [name], MD as Consulting Physician (Cardiology) [name], [facility] as Pharmacist (Pharmacist)  Recent office visits: 11/19/20 [name] OV: CPX. LFTs mildly elevated, avoid alcohol. LDL above goal, counseled statin compliance. Rx'd thiamine 100 mg daily.  06/22/20 [name] OV: acute visit for pubic abscess - rx Bactrim and mupirocin  Recent consult visits: 4/21/2 [name] (psych): f/u visit, no med changes. Pt moved out and is planning to file for divorce.  02/22/21 [name] (cardiology/CHF): started losartan 12.5 mg, repeat BMET in 10 days. (Pt did not pick up losartan or return for labs)  12/13/20 [name] (psych): rx'd strattera for inattention/trouble focusing. Of note pt and wife having difficulties, he had moved out for a while.  12/07/20 [name] (cardiology): f/u OSA, will get new BiPAP device since old one was broken.  06/27/20 [name] (psych): increased sertraline to 100 mg daily, plan to titrate to 200 mg for optimal effect. Viibyrd back down to 20 mg.  05/15/20 [name]  pharmacy appt: start valsartan 20 mg BID and Chantix.  05/01/20 [name] (psych): increase Viibryd to 40 mg  04/12/20 [name] (HF clinic): pt stopped losartan b/c he thought it was causing nosebleeds. No Entresto d/t hx angioedema. Restart Vascepa  03/05/20 [name] (psych): dx Bipolar 1 and panic disorder. Continue clonazepam, start sertraline and Abilify for mood sx.  Hospital visits: None in previous 6 months  Objective:  Lab Results  Component Value Date   CREATININE 0.99 02/22/2021   BUN 14 02/22/2021   GFR 79.10 11/19/2020   GFRNONAA >60 02/22/2021   GFRAA >60 05/15/2020   NA 138 02/22/2021   K 3.8 02/22/2021   CALCIUM 9.2 02/22/2021   CO2 26 02/22/2021    Lab Results  Component Value Date/Time   HGBA1C 5.8 (H) 02/22/2021 12:11 PM   HGBA1C 6.2 (A) 11/19/2020 02:28 PM   HGBA1C 5.5 03/21/2020 02:55 PM   HGBA1C 8.7 05/25/2018 12:00 AM   HGBA1C 8.7 05/25/2018 12:00 AM   HGBA1C 6.9 (H) 12/25/2015 10:55 AM   GFR 79.10 11/19/2020 02:54 PM   GFR 75.63 03/21/2020 02:55 PM   MICROALBUR 2.1 (H) 11/19/2020 02:54 PM   MICROALBUR 0.9 01/19/2019 04:39 PM    Last diabetic Eye exam:  Lab Results  Component Value Date/Time   HMDIABEYEEXA No Retinopathy 01/26/2019 12:00 AM    Last diabetic Foot exam: No results found for: HMDIABFOOTEX   Lab Results  Component Value Date   CHOL 157 02/22/2021   HDL 37 (L) 02/22/2021   LDLCALC 101 (H) 02/22/2021   LDLDIRECT 114.0 03/21/2020  TRIG 93 02/22/2021   CHOLHDL 4.2 02/22/2021    Hepatic Function Latest Ref Rng & Units 11/19/2020 03/21/2020 10/11/2019  Total Protein 6.0 - 8.3 g/dL 7.6 7.3 6.8  Albumin 3.5 - 5.2 g/dL 3.9 3.8 3.1(L)  AST 0 - 37 U/L _0 ALT 0 - 53 U/L _1 Alk Phosphatase 39 - 117 U/L 175(H) 90 73  Total Bilirubin 0.2 - 1.2 mg/dL 1.0 0.5 0.7  Bilirubin, Direct 0.0 - 0.3 mg/dL 0.3 0.1 -    Lab Results  Component Value Date/Time   TSH 1.27 03/21/2020 02:55 PM   TSH 1.54 01/19/2019 04:21 PM    CBC  Latest Ref Rng & Units 02/22/2021 11/19/2020 03/21/2020  WBC 4.0 - 10.5 K/uL 6.9 11.1(H) 9.2  Hemoglobin 13.0 - 17.0 g/dL 16.0 15.9 15.9  Hematocrit 39.0 - 52.0 % 45.4 45.9 46.4  Platelets 150 - 400 K/uL 245 243.0 265.0    Lab Results  Component Value Date/Time   VD25OH 44.92 11/19/2020 02:54 PM   VD25OH 48.62 04/12/2019 10:41 AM    Clinical ASCVD: No  The 10-year ASCVD risk score [name], et al., 2013) is: 15.5%   Values used to calculate the score:     Age: 80 years     Sex: Male     Is Non-Hispanic African American: Yes     Diabetic: Yes     Tobacco smoker: No     Systolic Blood Pressure: 888 mmHg     Is BP treated: Yes     HDL Cholesterol: 37 mg/dL     Total Cholesterol: 157 mg/dL    Depression screen [facility] 2/9 01/15/2021 12/21/2019 10/12/2019  Decreased Interest 0 3 3  Down, Depressed, Hopeless 0 2 2  PHQ - 2 Score 0 5 5  Altered sleeping - 3 3  Tired, decreased energy - 2 3  Change in appetite - 3 3  Feeling bad or failure about yourself  - 2 3  Trouble concentrating - 2 2  Moving slowly or fidgety/restless - 0 1  Suicidal thoughts - 0 0  PHQ-9 Score - 17 20  Difficult doing work/chores - Very difficult Very difficult  Some recent data might be hidden     CHA2DS2-VASc Score = 3  The patient's score is based upon: CHF History: Yes HTN History: Yes Diabetes History: Yes Stroke History: No Vascular Disease History: No   Social History   Tobacco Use  Smoking Status Former Smoker  . Packs/day: 0.50  . Years: 30.00  . Pack years: 15.00  . Types: Cigarettes  . Quit date: 05/15/2020  . Years since quitting: 0.9  Smokeless Tobacco Never Used  Tobacco Comment   vaping - nicotine-free products   BP Readings from Last 3 Encounters:  02/22/21 118/68  12/07/20 110/70  11/19/20 132/86   Pulse Readings from Last 3 Encounters:  02/22/21 (!) 59  12/07/20 70  11/19/20 80   Wt Readings from Last 3 Encounters:  02/22/21 (!) 384 lb 3.2 oz (174.3 kg)   02/18/21 (!) 380 lb (172.4 kg)  12/07/20 (!) 379 lb 12.8 oz (172.3 kg)   BMI Readings from Last 3 Encounters:  02/22/21 56.74 kg/m  02/18/21 56.12 kg/m  12/07/20 56.09 kg/m   Assessment/Interventions: Review of patient past medical history, allergies, medications, health status, including review of consultants reports, laboratory and other test data, was performed as part of comprehensive evaluation and provision of chronic care management services.   SDOH:  (Social  Determinants of Health) assessments and interventions performed: Yes   SDOH Screenings   Alcohol Screen: Low Risk   . Last Alcohol Screening Score (AUDIT): 0  Depression (PHQ2-9): Low Risk   . PHQ-2 Score: 0  Financial Resource Strain: Low Risk   . Difficulty of Paying Living Expenses: Not hard at all  Food Insecurity: No Food Insecurity  . Worried About Charity fundraiser in the Last Year: Never true  . Ran Out of Food in the Last Year: Never true  Housing: Low Risk   . Last Housing Risk Score: 0  Physical Activity: Sufficiently Active  . Days of Exercise per Week: 5 days  . Minutes of Exercise per Session: 30 min  Social Connections: Moderately Isolated  . Frequency of Communication with Friends and Family: More than three times a week  . Frequency of Social Gatherings with Friends and Family: Never  . Attends Religious Services: Never  . Active Member of Clubs or Organizations: No  . Attends Archivist Meetings: Never  . Marital Status: Married  Stress: No Stress Concern Present  . Feeling of Stress : Not at all  Tobacco Use: Medium Risk  . Smoking Tobacco Use: Former Smoker  . Smokeless Tobacco Use: Never Used  Transportation Needs: No Transportation Needs  . Lack of Transportation (Medical): No  . Lack of Transportation (Non-Medical): No    CCM Care Plan  Allergies  Allergen Reactions  . Ace Inhibitors Anaphylaxis and Swelling    Angioedema  . Bidil [Isosorb Dinitrate-Hydralazine]  Other (See Comments)    headache  . Digoxin And Related     Unspecified "side effects"  . Buspirone Other (See Comments)    dizziness    Medications Reviewed Today    Reviewed by [name], [facility] (Pharmacist) on 04/21/21 at 1024  Med List Status: <None>  Medication Order Taking? Sig Documenting Provider Last Dose Status Informant  ARIPiprazole (ABILIFY) 10 MG tablet [id number] Yes Take 1 tablet (10 mg total) by mouth daily. [place] Crane, MD Taking Active   atorvastatin (LIPITOR) 40 MG tablet [id number] Yes Take 1 tablet (40 mg total) by mouth daily. [name], MD Taking Active   blood glucose meter kit and supplies KIT [id number] Yes Use to check blood sugar. DX E11.9 [name], FNP Taking Active Other  clonazePAM Bobbye [place]) 1 MG tablet [id number] Yes Take 1 tablet (1 mg total) by mouth 2 (two) times daily as needed for anxiety. [place] Crane, MD Taking Active   FARXIGA 10 MG TABS tablet [id number] Yes TAKE 1 TABLET BY MOUTH EVERY DAY [name], MD Taking Active   losartan (COZAAR) 25 MG tablet [id number] Yes Take 0.5 tablets (12.5 mg total) by mouth at bedtime. [name], MD Taking Active   metoprolol succinate (TOPROL-XL) 100 MG 24 hr tablet [id number] Yes TAKE 1 TABLET (100 MG TOTAL) BY MOUTH DAILY. TAKE WITH OR IMMEDIATELY FOLLOWING A MEAL. [name], MD Taking Active   pantoprazole (PROTONIX) 40 MG tablet [id number] Yes TAKE 1 TABLET BY MOUTH EVERY DAY [name], MD Taking Active   potassium chloride SA (KLOR-CON) 20 MEQ tablet [id number] Yes Take 40 mEq by mouth daily as needed. [provider] Taking Active         Discontinued 02/20/12 1023 (Patient has not taken in last 30 days)   PRESCRIPTION MEDICATION [id number] Yes Inhale into the lungs See admin instructions. Bipap, pressure 16/12 with 2L of O2 - use whenever sleeping [provider] Taking Active Spouse/Significant Other  sertraline (ZOLOFT) 100 MG tablet [id number]  Yes Take 1 tablet (100 mg total) by mouth daily. [place] Crane, MD Taking Active   sildenafil (REVATIO) 20 MG tablet [id number] Yes Take 4 tablets (80 mg total) by mouth daily as needed. [name], MD Taking Active   spironolactone (ALDACTONE) 25 MG tablet [id number] Yes TAKE 1 TABLET (25 MG TOTAL) BY MOUTH DAILY. *MUST KEEP 12/20 APPOINTMENT FOR REFILLSJanith Lima, MD Taking Active   thiamine 100 MG tablet [id number] Yes Take 1 tablet (100 mg total) by mouth daily. [name], MD Taking Active   torsemide (DEMADEX) 20 MG tablet [id number]  Take 1-1.5 tablets (20-30 mg total) by mouth daily. [name], MD  Active   XARELTO 20 MG TABS tablet [id number] Yes TAKE 1 TABLET BY MOUTH EVERY DAY [name], MD Taking Active           Patient Active Problem List   Diagnosis Date Noted  . Attention deficit hyperactivity disorder (ADHD), inattentive type, mild 12/13/2020  . Colon cancer screening 11/19/2020  . Marital conflict 03/55/9741  . Bipolar 1 disorder, depressed, partial remission ([place]) 06/27/2020  . Bipolar I disorder, most recent episode depressed ([place]) 03/05/2020  . Encounter for screening for HIV 01/05/2020  . Hyperlipidemia with target LDL less than 100 10/12/2019  . Screen for colon cancer 10/12/2019  . Hidradenitis suppurativa of left axilla 10/12/2019  . Benign prostatic hyperplasia without lower urinary tract symptoms 10/12/2019  . Claudication of both lower extremities ([place]) 04/20/2019  . Drug-induced erectile dysfunction 02/28/2019  . Vitamin D deficiency disease 01/20/2019  . Severe episode of recurrent major depressive disorder, with psychotic features ([place]) 12/16/2018  . Bipolar 1 disorder, depressed ([place]) 12/16/2018  . Osteoarthritis of both ankles and feet 11/30/2018  . Diabetic neuropathy, painful ([place]) 11/30/2018  . Fatty liver disease, nonalcoholic 63/84/5364  . Thiamin deficiency 04/29/2018  . Cholelithiasis 04/22/2018  . Hyperbilirubinemia    . PE (pulmonary thromboembolism) ([place]) 04/12/2017  . Panic disorder 01/15/2017  . Hereditary and idiopathic peripheral neuropathy 07/16/2016  . AF (paroxysmal atrial fibrillation) ([place])   . Osteoarthritis of both knees 12/27/2014  . NSVT (nonsustained ventricular tachycardia) ([place]) 09/26/2013  . Alcohol abuse 08/10/2013  . Hypertriglyceridemia 06/29/2013  . Nonischemic cardiomyopathy ([place]) 01/26/2013  . Type II diabetes mellitus with manifestations ([place]) 04/13/2012  . Tobacco user 02/18/2012  . ED (erectile dysfunction) 07/11/2011  . Morbid obesity ([place]) 06/11/2010  . Essential hypertension 06/11/2010  . Chronic combined systolic and diastolic CHF (congestive heart failure) ([place]) 06/11/2010  . Esophageal reflux 06/11/2010  . OSA (obstructive sleep apnea) with BiPap and oxygen 06/11/2010    Immunization History  Administered Date(s) Administered  . 19-influenza Whole 10/20/2011  . Hep A / Hep B 05/25/2018  . Pneumococcal Polysaccharide-23 08/11/2014  . Tdap 10/20/2011    Conditions to be addressed/monitored:  Hypertension, Hyperlipidemia, Diabetes, Atrial Fibrillation, Heart Failure, Depression and Anxiety  Patient Care Plan: CCM Pharmacy Care Plan    Problem Identified: Hypertension, Hyperlipidemia, Diabetes, Atrial Fibrillation, Heart Failure, Depression and Anxiety   Priority: High    Long-Range Goal: Disease management   Start Date: 01/15/2021  Expected End Date: 07/15/2021  This Visit's Progress: On track  Recent Progress: On track  Priority: High  Note:   Current Barriers:  . Unable to independently monitor therapeutic efficacy . Unable to achieve control of mood/fatigue   Pharmacist Clinical Goal(s):  Marland Kitchen Patient will achieve adherence to  monitoring guidelines and medication adherence to achieve therapeutic efficacy . maintain control of mood/fatigue as evidenced by patient report  through collaboration with PharmD and provider.   Interventions: . 1:1  collaboration with [name], MD regarding development and update of comprehensive plan of care as evidenced by provider attestation and co-signature . Inter-disciplinary care team collaboration (see longitudinal plan of care) . Comprehensive medication review performed; medication list updated in electronic medical record  Hypertension / Heart Failure (BP goal < 130/80) Controlled - BP is at goal; pt has filled losartan as prescribed Last ejection fraction 01/23/2020: 20-25% NYHA Class: II (slight limitation of activity) AHA HF Stage: C (Heart disease and symptoms present) Home BP readings: unable to take, monitor is broken Current regimen:  ? Metoprolol succinate 100 mg daily PM ? Losartan 25 mg daily ? Spironolactone 25 mg daily ? Torsemide 20-30 mg daily as needed ? Farxiga 10 mg daily ? Potassium chloride 20 mEq - 2 tab daily as needed w/ torsemide Interventions: ? Counseled on benefits of ARB, beta blocker, spironolactone, and Farxiga in heart failure ? Advised to check BP at home daily, limit salt ? Recommend to continue current medication   Hyperlipidemia (LDL goal < 100 and TRIG goal < 150) Uncontrolled  - LDL improved from 164 > 101 after restarting statin; pt endorses compliance and denies side effects Current regimen:  ? Atorvastatin 40 mg daily ? Vascepa 1 g - 2 cap twice a day Interventions: ? Discussed cholesterol goals and benefits of medications for prevention of heart attack / stroke ? Discussed dosing for Vascepa is 2 capsules twice a day - previously was only taking 2 capsules per day ? Recommend to continue current medication   Diabetes (A1c goal < 7%) Controlled  - A1c is at goal; Wilder Glade is more for heart failure than DM Current regimen:  ? Farxiga 10 mg daily Interventions: ? Discussed blood sugar goals and benefits of medications for prevention of diabetic complications ? Discussed mechanism of Wilder Glade and additional benefits for heart  failure ? Recommend to continue current medication   Bipolar Depression Not ideally controlled - pt follows with [name] for therapy and medication management; pt c/o fatigue consistently Current regimen:  ? Aripiprazole 10 mg daily ? Sertraline 100 mg daily ? Clonazepam 1 mg twice a day as needed Interventions: ? Discussed fatigue as medication side effects vs symptom of depression vs physical deconditioning ? Recommended to move medications to bedtime ? Discussed dangers of stimulant medications given his cardiac history - stimulants will likely raise BP and HR which would be dangerous. Recommend to avoid stimulants to help with "focusing" ? Advised to increase activity/exercise ? Recommend to continue current medication   Patient Goals/Self-Care Activities . Patient will:  - take medications as prescribed focus on medication adherence by pill box check blood pressure daily, document, and provide at future appointments weigh daily, and contact provider if weight gain of 3 lbs overnight or 5 lbs in a week target a minimum of 150 minutes of moderate intensity exercise weekly  Follow Up Plan: Telephone follow up appointment with care management team member scheduled for: 6 months    Medication Assistance: None required.  Patient affirms current coverage meets needs.  Patient's preferred pharmacy is:  [id number] [place] [place]Phone: [phone]: [phone] [id number] [phone]: [phone]: [phone]  Uses pill box? Yes Pt endorses 90% compliance  We discussed: Current pharmacy is preferred with insurance plan and patient is satisfied with pharmacy services Patient decided to: Continue current medication management strategy  Care Plan and Follow Up Patient Decision:  Patient agrees to Care Plan and Follow-up.  Plan:  Telephone follow up appointment with care management team member scheduled for:  6 months  Charlene Brooke, PharmD, [place], CPP Clinical Pharmacist [facility] [phone]

## 2021-04-18 ENCOUNTER — Other Ambulatory Visit (HOSPITAL_COMMUNITY): Payer: Self-pay | Admitting: *Deleted

## 2021-04-18 MED ORDER — TORSEMIDE 20 MG PO TABS: 20.0000 mg | ORAL_TABLET | Freq: Every day | ORAL | 3 refills | Status: DC

## 2021-04-21 NOTE — Patient Instructions (Signed)
Visit Information  Phone number for Pharmacist: [phone]  Goals Addressed            This Visit's Progress   . Manage My Medicine       Timeframe:  Long-Range Goal Priority:  High Start Date:  04/17/21                           Expected End Date:     04/17/22                  Follow Up Date Aug 2022   - call for medicine refill 2 or 3 days before it runs out - call if I am sick and can't take my medicine - keep a list of all the medicines I take; vitamins and herbals too - use a pillbox to sort medicine    Why is this important?   . These steps will help you keep on track with your medicines.   Notes:        Patient verbalizes understanding of instructions provided today and agrees to view in [place].  Telephone follow up appointment with pharmacy team member scheduled for: 6 months  Charlene Brooke, PharmD, [place], CPP Clinical Pharmacist [facility] [phone]

## 2021-04-22 DIAGNOSIS — G4733 Obstructive sleep apnea (adult) (pediatric): Secondary | ICD-10-CM | POA: Diagnosis not present

## 2021-05-07 DIAGNOSIS — R0602 Shortness of breath: Secondary | ICD-10-CM | POA: Diagnosis not present

## 2021-05-07 DIAGNOSIS — E119 Type 2 diabetes mellitus without complications: Secondary | ICD-10-CM | POA: Diagnosis not present

## 2021-05-07 DIAGNOSIS — R0600 Dyspnea, unspecified: Secondary | ICD-10-CM | POA: Diagnosis not present

## 2021-05-07 DIAGNOSIS — I10 Essential (primary) hypertension: Secondary | ICD-10-CM | POA: Diagnosis not present

## 2021-05-07 DIAGNOSIS — Z7901 Long term (current) use of anticoagulants: Secondary | ICD-10-CM | POA: Diagnosis not present

## 2021-05-07 DIAGNOSIS — Z86718 Personal history of other venous thrombosis and embolism: Secondary | ICD-10-CM | POA: Diagnosis not present

## 2021-05-07 DIAGNOSIS — Z20822 Contact with and (suspected) exposure to covid-19: Secondary | ICD-10-CM | POA: Diagnosis not present

## 2021-05-09 ENCOUNTER — Other Ambulatory Visit: Payer: Self-pay

## 2021-05-09 ENCOUNTER — Encounter (HOSPITAL_COMMUNITY): Payer: Self-pay | Admitting: Emergency Medicine

## 2021-05-09 ENCOUNTER — Telehealth (HOSPITAL_COMMUNITY): Payer: Self-pay | Admitting: *Deleted

## 2021-05-09 ENCOUNTER — Emergency Department (HOSPITAL_COMMUNITY): Payer: Medicare HMO

## 2021-05-09 ENCOUNTER — Inpatient Hospital Stay (HOSPITAL_COMMUNITY)
Admission: EM | Admit: 2021-05-09 | Discharge: 2021-05-19 | DRG: 208 | Disposition: A | Discharge status: Home / Self Care | Payer: Medicare HMO | Attending: Internal Medicine | Admitting: Internal Medicine

## 2021-05-09 DIAGNOSIS — F410 Panic disorder [episodic paroxysmal anxiety] without agoraphobia: Secondary | ICD-10-CM | POA: Diagnosis present

## 2021-05-09 DIAGNOSIS — E785 Hyperlipidemia, unspecified: Secondary | ICD-10-CM | POA: Diagnosis present

## 2021-05-09 DIAGNOSIS — J189 Pneumonia, unspecified organism: Secondary | ICD-10-CM | POA: Diagnosis not present

## 2021-05-09 DIAGNOSIS — I4891 Unspecified atrial fibrillation: Secondary | ICD-10-CM

## 2021-05-09 DIAGNOSIS — K8020 Calculus of gallbladder without cholecystitis without obstruction: Secondary | ICD-10-CM | POA: Diagnosis not present

## 2021-05-09 DIAGNOSIS — F411 Generalized anxiety disorder: Secondary | ICD-10-CM | POA: Diagnosis present

## 2021-05-09 DIAGNOSIS — I5043 Acute on chronic combined systolic (congestive) and diastolic (congestive) heart failure: Secondary | ICD-10-CM | POA: Diagnosis present

## 2021-05-09 DIAGNOSIS — I480 Paroxysmal atrial fibrillation: Secondary | ICD-10-CM | POA: Diagnosis present

## 2021-05-09 DIAGNOSIS — R079 Chest pain, unspecified: Secondary | ICD-10-CM | POA: Diagnosis not present

## 2021-05-09 DIAGNOSIS — Z823 Family history of stroke: Secondary | ICD-10-CM

## 2021-05-09 DIAGNOSIS — Z720 Tobacco use: Secondary | ICD-10-CM | POA: Diagnosis present

## 2021-05-09 DIAGNOSIS — R42 Dizziness and giddiness: Secondary | ICD-10-CM | POA: Diagnosis not present

## 2021-05-09 DIAGNOSIS — Z6841 Body Mass Index (BMI) 40.0 and over, adult: Secondary | ICD-10-CM

## 2021-05-09 DIAGNOSIS — I130 Hypertensive heart and chronic kidney disease with heart failure and stage 1 through stage 4 chronic kidney disease, or unspecified chronic kidney disease: Secondary | ICD-10-CM | POA: Diagnosis present

## 2021-05-09 DIAGNOSIS — F319 Bipolar disorder, unspecified: Secondary | ICD-10-CM | POA: Diagnosis present

## 2021-05-09 DIAGNOSIS — I509 Heart failure, unspecified: Secondary | ICD-10-CM

## 2021-05-09 DIAGNOSIS — Z7901 Long term (current) use of anticoagulants: Secondary | ICD-10-CM

## 2021-05-09 DIAGNOSIS — R0602 Shortness of breath: Secondary | ICD-10-CM | POA: Diagnosis not present

## 2021-05-09 DIAGNOSIS — R579 Shock, unspecified: Secondary | ICD-10-CM | POA: Diagnosis not present

## 2021-05-09 DIAGNOSIS — I428 Other cardiomyopathies: Secondary | ICD-10-CM

## 2021-05-09 DIAGNOSIS — Z9114 Patient's other noncompliance with medication regimen: Secondary | ICD-10-CM

## 2021-05-09 DIAGNOSIS — I5023 Acute on chronic systolic (congestive) heart failure: Secondary | ICD-10-CM | POA: Diagnosis not present

## 2021-05-09 DIAGNOSIS — Z978 Presence of other specified devices: Secondary | ICD-10-CM

## 2021-05-09 DIAGNOSIS — F17290 Nicotine dependence, other tobacco product, uncomplicated: Secondary | ICD-10-CM | POA: Diagnosis present

## 2021-05-09 DIAGNOSIS — Z888 Allergy status to other drugs, medicaments and biological substances status: Secondary | ICD-10-CM

## 2021-05-09 DIAGNOSIS — I5042 Chronic combined systolic (congestive) and diastolic (congestive) heart failure: Secondary | ICD-10-CM | POA: Diagnosis present

## 2021-05-09 DIAGNOSIS — J029 Acute pharyngitis, unspecified: Secondary | ICD-10-CM | POA: Diagnosis not present

## 2021-05-09 DIAGNOSIS — I517 Cardiomegaly: Secondary | ICD-10-CM | POA: Diagnosis not present

## 2021-05-09 DIAGNOSIS — Z833 Family history of diabetes mellitus: Secondary | ICD-10-CM

## 2021-05-09 DIAGNOSIS — I482 Chronic atrial fibrillation, unspecified: Secondary | ICD-10-CM | POA: Diagnosis present

## 2021-05-09 DIAGNOSIS — E1165 Type 2 diabetes mellitus with hyperglycemia: Secondary | ICD-10-CM | POA: Diagnosis present

## 2021-05-09 DIAGNOSIS — Z79899 Other long term (current) drug therapy: Secondary | ICD-10-CM

## 2021-05-09 DIAGNOSIS — J9690 Respiratory failure, unspecified, unspecified whether with hypoxia or hypercapnia: Secondary | ICD-10-CM

## 2021-05-09 DIAGNOSIS — G8929 Other chronic pain: Secondary | ICD-10-CM | POA: Diagnosis present

## 2021-05-09 DIAGNOSIS — N179 Acute kidney failure, unspecified: Secondary | ICD-10-CM | POA: Diagnosis present

## 2021-05-09 DIAGNOSIS — J811 Chronic pulmonary edema: Secondary | ICD-10-CM | POA: Diagnosis not present

## 2021-05-09 DIAGNOSIS — J80 Acute respiratory distress syndrome: Secondary | ICD-10-CM | POA: Diagnosis not present

## 2021-05-09 DIAGNOSIS — J9601 Acute respiratory failure with hypoxia: Principal | ICD-10-CM | POA: Diagnosis present

## 2021-05-09 DIAGNOSIS — R042 Hemoptysis: Secondary | ICD-10-CM | POA: Diagnosis present

## 2021-05-09 DIAGNOSIS — I493 Ventricular premature depolarization: Secondary | ICD-10-CM | POA: Diagnosis not present

## 2021-05-09 DIAGNOSIS — I4901 Ventricular fibrillation: Secondary | ICD-10-CM | POA: Diagnosis not present

## 2021-05-09 DIAGNOSIS — Z20822 Contact with and (suspected) exposure to covid-19: Secondary | ICD-10-CM | POA: Diagnosis present

## 2021-05-09 DIAGNOSIS — E118 Type 2 diabetes mellitus with unspecified complications: Secondary | ICD-10-CM | POA: Diagnosis present

## 2021-05-09 DIAGNOSIS — Z4659 Encounter for fitting and adjustment of other gastrointestinal appliance and device: Secondary | ICD-10-CM

## 2021-05-09 DIAGNOSIS — Z9911 Dependence on respirator [ventilator] status: Secondary | ICD-10-CM

## 2021-05-09 DIAGNOSIS — I462 Cardiac arrest due to underlying cardiac condition: Secondary | ICD-10-CM | POA: Diagnosis not present

## 2021-05-09 DIAGNOSIS — I110 Hypertensive heart disease with heart failure: Secondary | ICD-10-CM | POA: Diagnosis not present

## 2021-05-09 DIAGNOSIS — M25559 Pain in unspecified hip: Secondary | ICD-10-CM

## 2021-05-09 DIAGNOSIS — R519 Headache, unspecified: Secondary | ICD-10-CM | POA: Diagnosis present

## 2021-05-09 DIAGNOSIS — Z86718 Personal history of other venous thrombosis and embolism: Secondary | ICD-10-CM

## 2021-05-09 DIAGNOSIS — Z86711 Personal history of pulmonary embolism: Secondary | ICD-10-CM

## 2021-05-09 DIAGNOSIS — Z01818 Encounter for other preprocedural examination: Secondary | ICD-10-CM

## 2021-05-09 DIAGNOSIS — G4733 Obstructive sleep apnea (adult) (pediatric): Secondary | ICD-10-CM | POA: Diagnosis present

## 2021-05-09 DIAGNOSIS — E781 Pure hyperglyceridemia: Secondary | ICD-10-CM | POA: Diagnosis present

## 2021-05-09 DIAGNOSIS — Z8249 Family history of ischemic heart disease and other diseases of the circulatory system: Secondary | ICD-10-CM

## 2021-05-09 DIAGNOSIS — N182 Chronic kidney disease, stage 2 (mild): Secondary | ICD-10-CM | POA: Diagnosis present

## 2021-05-09 DIAGNOSIS — F333 Major depressive disorder, recurrent, severe with psychotic symptoms: Secondary | ICD-10-CM | POA: Diagnosis present

## 2021-05-09 DIAGNOSIS — E1122 Type 2 diabetes mellitus with diabetic chronic kidney disease: Secondary | ICD-10-CM | POA: Diagnosis present

## 2021-05-09 DIAGNOSIS — M25552 Pain in left hip: Secondary | ICD-10-CM | POA: Diagnosis present

## 2021-05-09 DIAGNOSIS — I10 Essential (primary) hypertension: Secondary | ICD-10-CM | POA: Diagnosis present

## 2021-05-09 DIAGNOSIS — I472 Ventricular tachycardia: Secondary | ICD-10-CM | POA: Diagnosis not present

## 2021-05-09 LAB — RESP PANEL BY RT-PCR (FLU A&B, COVID) ARPGX2
Influenza A by PCR: NEGATIVE
Influenza B by PCR: NEGATIVE
SARS Coronavirus 2 by RT PCR: NEGATIVE

## 2021-05-09 MED ORDER — METOPROLOL TARTRATE 5 MG/5ML IV SOLN
5.0000 mg | INTRAVENOUS | Status: DC | PRN
  Administered 2021-05-09 (×2): 5 mg via INTRAVENOUS
  Filled 2021-05-09 (×3): qty 5

## 2021-05-09 NOTE — ED Triage Notes (Signed)
Pt c/o SOB, CP, back pain, dizziness, and increased weakness x 3 days. Denies increased swelling.   Was seen at [facility] for similar s/s. D/C with inhaler without improvement

## 2021-05-09 NOTE — ED Provider Notes (Signed)
Emergency Medicine Provider Triage Evaluation Note  [name] , a 55 y.o. male was evaluated in triage.  Pt complains of SOB, CP. Seen by [facility] 3 days ago for similar complaints without relief. D- imer from [facility] WNL.Sx intermittent in nature. No abd pain. Feels intermittently dizzy with movement and when CP occurs. Does have low back pain which is not new. No recent trauma or injuries. No hx of dissection, aneurysm. Does have hx of PE on anticoagulation. Has not missed any doses. No LE edema.  Review of Systems  Positive: CP, SOB, dizziness Negative: Syncope  Physical Exam  BP 97/76   Pulse 73   Temp 97.8 F (36.6 C) (Oral)   Resp 18   Ht 5\' 8"  (1.727 m)   Wt (!) 172.4 kg   SpO2 95%   BMI 57.78 kg/m  Gen:   Awake, no distress   Cardiac: Equal rise and fall to chest wall. 2+ radial pulses bilaterally Resp:  Normal effort  Abd:   Soft non tender MSK:   Moves extremities without difficulty. No LE edema Other:    Medical Decision Making  Medically screening exam initiated at 9:48 PM.  Appropriate orders placed.  [name] was informed that the remainder of the evaluation will be completed by another provider, this initial triage assessment does not replace that evaluation, and the importance of remaining in the ED until their evaluation is complete.  EKG here with afib with RVR  Will need room in back. Nursing aware.  CP, SOB, dizziness   Kattie Santoyo A, PA-C 05/09/21 2148    Dorie Rank, MD 05/09/21 2323

## 2021-05-09 NOTE — ED Provider Notes (Signed)
[facility] EMERGENCY DEPARTMENT Provider Note   CSN: [id number] Arrival date & time: 05/09/21  2050     History Chief Complaint  Patient presents with   Shortness of Breath   Chest Pain    [name] is a 55 y.o. male.  Presents to ER with concern for shortness of breath.  Patient states for the past few days he has been feeling more short of breath than normal.  Went to an outside hospital and was admitted.  States that they gave him Lasix and albuterol and this did not improve his symptoms.  States that they had a difficult time controlling his heart rate during that admission.  He decided to come here because his cardiologist was here.  States that he still feels short of breath, this is worse with exertion.  He does not have any associated chest pain.  Patient has a history of atrial fibrillation, heart failure.  Reports compliance with anticoagulation.  Denies history of COPD or asthma.  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/2020   Colon cancer screening 22/97/9892   Marital conflict 11/94/1740   Bipolar 1 disorder, depressed, partial remission ([place]) 06/27/2020   Bipolar I disorder, most recent episode depressed ([place]) 03/05/2020   Encounter for screening for HIV 01/05/2020   Hyperlipidemia with target LDL less than 100 10/12/2019   Screen for colon cancer 10/12/2019   Hidradenitis suppurativa of left axilla 10/12/2019    Benign prostatic hyperplasia without lower urinary tract symptoms 10/12/2019   Claudication of both lower extremities ([place]) 04/20/2019   Drug-induced erectile dysfunction 02/28/2019   Vitamin D deficiency disease 01/20/2019   Severe episode of recurrent major depressive disorder, with psychotic features ([place]) 12/16/2018   Bipolar 1 disorder, depressed ([place]) 12/16/2018   Osteoarthritis of both ankles and feet 11/30/2018   Diabetic neuropathy, painful ([place]) 11/30/2018   Fatty liver disease, nonalcoholic 81/44/8185   Thiamin deficiency 04/29/2018   Cholelithiasis 04/22/2018   Hyperbilirubinemia    PE (pulmonary thromboembolism) ([place]) 04/12/2017   Panic disorder 01/15/2017   Hereditary and idiopathic peripheral neuropathy 07/16/2016   AF (paroxysmal atrial fibrillation) (HCC)    Osteoarthritis of both knees 12/27/2014   NSVT (nonsustained ventricular tachycardia) ([place]) 09/26/2013   Alcohol abuse 08/10/2013   Hypertriglyceridemia 06/29/2013   Nonischemic cardiomyopathy ([place]) 01/26/2013   Type II diabetes mellitus with manifestations ([place]) 04/13/2012   Tobacco user 02/18/2012   ED (erectile dysfunction) 07/11/2011   Morbid obesity ([place]) 06/11/2010   Essential hypertension 06/11/2010   Chronic combined systolic and diastolic CHF (congestive heart failure) ([place]) 06/11/2010   Esophageal reflux 06/11/2010   OSA (obstructive sleep apnea) with BiPap and oxygen 06/11/2010    Past Surgical History:  Procedure Laterality Date   CARDIAC CATHETERIZATION     CARDIAC CATHETERIZATION N/A 08/13/2015   Procedure: Right/Left Heart  Cath and Coronary Angiography;  Surgeon: [name], MD;  Location: [facility];  Service: Cardiovascular;  Laterality: N/A;   RIGHT/LEFT HEART CATH AND CORONARY ANGIOGRAPHY N/A 04/14/2017   Procedure: Right/Left Heart Cath and Coronary Angiography;  Surgeon: [name], MD;  Location: [facility];  Service: Cardiovascular;  Laterality: N/A;   TESTICLE  SURGERY         Family History  Problem Relation Age of Onset   Cancer Mother        brain tumor   Hypertension Mother    Diabetes Father        Deceased, 19   Heart disease Maternal Grandmother    Hypertension Other        Family History   Stroke Other        Family History   Diabetes Other        Family History   Diabetes Daughter     Social History   Tobacco Use   Smoking status: Former    Packs/day: 0.50    Years: 30.00    Pack years: 15.00    Types: Cigarettes    Quit date: 05/15/2020    Years since quitting: 0.9   Smokeless tobacco: Never   Tobacco comments:    vaping - nicotine-free products  Vaping Use   Vaping Use: Never used  Substance Use Topics   Alcohol use: Not Currently    Alcohol/week: 0.0 standard drinks   Drug use: No    Home Medications Prior to Admission medications   Medication Sig Start Date End Date Taking? Authorizing Provider  albuterol (VENTOLIN HFA) 108 (90 Base) MCG/ACT inhaler Inhale 1-2 puffs into the lungs every 4 (four) hours as needed for shortness of breath or wheezing. 05/08/21  Yes [provider]  ARIPiprazole (ABILIFY) 10 MG tablet Take 1 tablet (10 mg total) by mouth daily. 03/21/21  Yes Nevada Crane, MD  atorvastatin (LIPITOR) 40 MG tablet Take 1 tablet (40 mg total) by mouth daily. 11/20/20  Yes [name], MD  blood glucose meter kit and supplies KIT Use to check blood sugar. DX E11.9 04/30/18  Yes [name], FNP  clonazePAM (KLONOPIN) 1 MG tablet Take 1 tablet (1 mg total) by mouth 2 (two) times daily as needed for anxiety. 03/21/21  Yes [place] Crane, MD  FARXIGA 10 MG TABS tablet TAKE 1 TABLET BY MOUTH EVERY DAY Patient taking differently: Take 10 mg by mouth daily. 05/29/20  Yes [name], MD  losartan (COZAAR) 25 MG tablet Take 0.5 tablets (12.5 mg total) by mouth at bedtime. 02/22/21  Yes [name], MD  metoprolol succinate (TOPROL-XL) 100 MG 24 hr tablet TAKE 1 TABLET (100 MG TOTAL)  BY MOUTH DAILY. TAKE WITH OR IMMEDIATELY FOLLOWING A MEAL. Patient taking differently: Take 100 mg by mouth. 05/30/20  Yes [name], MD  pantoprazole (PROTONIX) 40 MG tablet TAKE 1 TABLET BY MOUTH EVERY DAY Patient taking differently: Take 40 mg by mouth daily. 04/04/21  Yes [name], MD  potassium chloride SA (KLOR-CON) 20 MEQ tablet Take 40 mEq by mouth daily as needed (cramps).   Yes [provider]  PRESCRIPTION MEDICATION Inhale into the lungs See admin instructions. Bipap, pressure 16/12 with 2L of O2 - use whenever sleeping   Yes [provider]  sertraline (ZOLOFT) 100 MG tablet Take 1 tablet (100 mg total) by mouth daily. 03/21/21  Yes Nevada Crane, MD  sildenafil (REVATIO) 20  MG tablet Take 4 tablets (80 mg total) by mouth daily as needed. Patient taking differently: Take 80 mg by mouth daily as needed (ed). 03/21/20  Yes [name], MD  spironolactone (ALDACTONE) 25 MG tablet TAKE 1 TABLET (25 MG TOTAL) BY MOUTH DAILY. *MUST KEEP 12/20 APPOINTMENT FOR REFILLS* Patient taking differently: Take 25 mg by mouth daily. 12/10/20  Yes [name], MD  thiamine 100 MG tablet Take 1 tablet (100 mg total) by mouth daily. 11/22/20  Yes [name], MD  torsemide (DEMADEX) 20 MG tablet Take 1-1.5 tablets (20-30 mg total) by mouth daily. 04/18/21  Yes [name], MD  vitamin B-12 (CYANOCOBALAMIN) 1000 MCG tablet Take 1,000 mcg by mouth daily.   Yes [provider]  XARELTO 20 MG TABS tablet TAKE 1 TABLET BY MOUTH EVERY DAY Patient taking differently: Take 20 mg by mouth daily. 03/27/21  Yes [name], MD  pravastatin (PRAVACHOL) 40 MG tablet Take 40 mg by mouth daily.  02/20/12  [provider]    Allergies    Ace inhibitors, Bidil [isosorb dinitrate-hydralazine], Digoxin and related, and Buspirone  Review of Systems   Review of Systems  Constitutional:  Negative for chills and fever.  HENT:  Negative for ear pain and sore  throat.   Eyes:  Negative for pain and visual disturbance.  Respiratory:  Positive for shortness of breath. Negative for cough.   Cardiovascular:  Negative for chest pain and palpitations.  Gastrointestinal:  Negative for abdominal pain and vomiting.  Genitourinary:  Negative for dysuria and hematuria.  Musculoskeletal:  Negative for arthralgias and back pain.  Skin:  Negative for color change and rash.  Neurological:  Negative for seizures and syncope.  All other systems reviewed and are negative.  Physical Exam Updated Vital Signs BP 108/90   Pulse 73   Temp 97.8 F (36.6 C) (Oral)   Resp 20   Ht 5' 8"  (1.727 m)   Wt (!) 172.4 kg   SpO2 97%   BMI 57.78 kg/m   Physical Exam Vitals and nursing note reviewed.  Constitutional:      Appearance: He is well-developed.  HENT:     Head: Normocephalic and atraumatic.  Eyes:     Conjunctiva/sclera: Conjunctivae normal.  Cardiovascular:     Rate and Rhythm: Normal rate and regular rhythm.     Heart sounds: No murmur heard. Pulmonary:     Comments: Mild tachypnea but no distress Abdominal:     Palpations: Abdomen is soft.     Tenderness: There is no abdominal tenderness.  Musculoskeletal:     Cervical back: Neck supple.  Skin:    General: Skin is warm and dry.  Neurological:     Mental Status: He is alert.    ED Results / Procedures / Treatments   Labs (all labs ordered are listed, but only abnormal results are displayed) Labs Reviewed  RESP PANEL BY RT-PCR (FLU A&B, COVID) ARPGX2  CBC WITH DIFFERENTIAL/PLATELET  COMPREHENSIVE METABOLIC PANEL  BRAIN NATRIURETIC PEPTIDE  MAGNESIUM  TROPONIN I (HIGH SENSITIVITY)  TROPONIN I (HIGH SENSITIVITY)    EKG EKG Interpretation  Date/Time:  Thursday May 09 2021 21:45:59 EDT Ventricular Rate:  156 PR Interval:    QRS Duration: 134 QT Interval:  340 QTC Calculation: 547 R Axis:   129 Text Interpretation: Atrial fibrillation with rapid ventricular response Non-specific  intra-ventricular conduction block T wave abnormality, consider inferior ischemia Abnormal ECG Confirmed by Madalyn Rob [phone]) on 05/09/2021  11:37:26 PM  Radiology DG Chest Portable 1 View  Result Date: 05/09/2021 CLINICAL DATA:  Chest pain and shortness of breath. EXAM: PORTABLE CHEST 1 VIEW COMPARISON:  November 20, 2018 FINDINGS: Mildly increased interstitial lung markings are seen with mild prominence of the pulmonary vasculature. Mild areas of atelectasis and/or infiltrate are seen within the bilateral lung bases. There is no evidence of a pleural effusion. The cardiac silhouette is moderately enlarged and unchanged in size. The visualized skeletal structures are unremarkable. IMPRESSION: Stable cardiomegaly with mild pulmonary vascular congestion. Electronically Signed   By: Virgina Norfolk M.D.   On: 05/09/2021 22:18    Procedures .Critical Care  Date/Time: 05/09/2021 11:37 PM Performed by: Lucrezia Starch, MD Authorized by: Lucrezia Starch, MD   Critical care provider statement:    Critical care time (minutes):  36   Critical care was necessary to treat or prevent imminent or life-threatening deterioration of the following conditions:  Cardiac failure   Critical care was time spent personally by me on the following activities:  Discussions with consultants, evaluation of patient's response to treatment, examination of patient, ordering and performing treatments and interventions, ordering and review of laboratory studies, ordering and review of radiographic studies, pulse oximetry, re-evaluation of patient's condition, obtaining history from patient or surrogate and review of old charts   Medications Ordered in ED Medications  metoprolol tartrate (LOPRESSOR) injection 5 mg (5 mg Intravenous Given 05/09/21 2236)    ED Course  I have reviewed the triage vital signs and the nursing notes.  Pertinent labs & imaging results that were available during my care of the patient were  reviewed by me and considered in my medical decision making (see chart for details).    MDM Rules/Calculators/A&P                          55 year old male with history of systolic heart failure presented to ER with concern for shortness of breath.  On exam patient was noted to be significantly tachycardic, mild tachypnea but not in acute distress.  EKG demonstrating A. fib with RVR.  Has history of A. Fib.  Lungs were clear, no wheezing appreciated on my exam.  Chest x-ray with cardiomegaly and mild pulmonary vascular congestion.  Suspect symptoms most likely related to A. fib and heart failure.  Will check labs, will give trial of as needed metoprolol for rate control.  HR improved somewhat.   While awaiting lab work and reassessment, signed out to Dr. Sedonia Small.  Final Clinical Impression(s) / ED Diagnoses Final diagnoses:  Atrial fibrillation, unspecified type ([place])  Acute on chronic heart failure, unspecified heart failure type [facility])    Rx / DC Orders ED Discharge Orders     None        Lucrezia Starch, MD 05/09/21 2338

## 2021-05-09 NOTE — ED Provider Notes (Addendum)
Provider Note MRN:  [id number]  Arrival date & time: 05/10/21    ED Course and Medical Decision Making  Assumed care from Dr. Roslynn Amble at shift change.  A. fib with RVR, receiving metoprolol, awaiting labs, plan is to consult cardiology.  On my assessment patient is back to sinus rhythm, has a left bundle which seems to be chronic.  He continues to have episodes of shortness of breath, he is also endorsing some left-sided thoracic back pain that is worse with breathing, is unchanged with movement.  History of PE.  Proceeding with CTA to ensure no recurrent blood clots.  Patient's labs were revealing a mild AKI.  He has no JVD, no lower extremity edema.  He says he has been increasing his Lasix dosing at home for the past few days.  Given this, I thought it was possible he was a bit over diuresed and with the AKI and the need for CTA imaging to give him a small amount of IV normal saline.  After CTA imaging patient began demanding more oxygen, becoming more short of breath.  Coughing up pink frothy fluid.  Clinically there is concern for acute pulmonary edema.  Was on nonrebreather to maintain sats in low 90s, now doing much better on BiPAP.  CT is without evidence of PE, no significant edema but evidence of multifocal pneumonia.  Starting antibiotics, will provide Lasix, accepted to stepdown unit for further care.  5 AM update: Patient continues to deteriorate despite BiPAP, continues to have more and more hemoptysis, still light pink but copious.  Continues to have difficulty tolerating BiPAP.  Discussed case with hospitalist as well as ICU team, ICU team will take over, decision made to intubate here in the emergency department.  See procedural details below.  Going upstairs for emergent bronc given the continued hypoxia and hemoptysis.  Concern is now for ARDS related to underlying infection or vape use.  .Critical Care  Date/Time: 05/10/2021 3:53 AM Performed by: Maudie Flakes, MD Authorized  by: Maudie Flakes, MD   Critical care provider statement:    Critical care time (minutes):  36   Critical care was necessary to treat or prevent imminent or life-threatening deterioration of the following conditions:  Respiratory failure   Critical care was time spent personally by me on the following activities:  Discussions with consultants, evaluation of patient's response to treatment, examination of patient, ordering and performing treatments and interventions, ordering and review of laboratory studies, ordering and review of radiographic studies, pulse oximetry, re-evaluation of patient's condition, obtaining history from patient or surrogate and review of old charts   I assumed direction of critical care for this patient from another provider in my specialty: yes     Care discussed with: admitting provider   Procedure Name: Intubation Date/Time: 05/10/2021 5:10 AM Performed by: Maudie Flakes, MD Pre-anesthesia Checklist: Patient identified, Emergency Drugs available, Suction available, Timeout performed and Patient being monitored Oxygen Delivery Method: Non-rebreather mask Preoxygenation: Pre-oxygenation with 100% oxygen Induction Type: Rapid sequence and IV induction Ventilation: Mask ventilation without difficulty Laryngoscope Size: Glidescope and 4 Grade View: Grade I Tube size: 8.0 mm Number of attempts: 1 Airway Equipment and Method: Rigid stylet Placement Confirmation: ETT inserted through vocal cords under direct vision, Positive ETCO2, Breath sounds checked- equal and bilateral and CO2 detector Secured at: 25 cm Tube secured with: ETT holder Dental Injury: Teeth and Oropharynx as per pre-operative assessment  Comments: Patient with severe hypoxic respiratory failure, difficulty tolerating BiPAP or  nonrebreather and so preoxygenation likely not ideal.  After induction medications with 30 mg etomidate, 100 mg rocuronium, initial oxygen saturations in the 70s attempted  bag-valve-mask ventilation for about 30 seconds to see if we could improve these oxygen saturations prior to intubation attempt.  Despite good seal and chest rise, continued downtrending of oxygen into the 60s, no improvement.  Decision made to quickly pass the tube, grade 1 view, through the cords without issue.  Persistent hypoxia despite ET tube, seems to be improving with increased PEEP.     Final Clinical Impressions(s) / ED Diagnoses     ICD-10-CM   1. Atrial fibrillation, unspecified type ([place])  I48.91     2. Acute on chronic heart failure, unspecified heart failure type (HCC)  I50.9     3. Acute respiratory failure with hypoxia [facility])  J96.01       ED Discharge Orders     None       Discharge Instructions   None     Barth Kirks. Sedonia Small, [place] [email or web address]    Maudie Flakes, MD 05/10/21 [id number]    Maudie Flakes, MD 05/10/21 [phone]

## 2021-05-09 NOTE — Telephone Encounter (Signed)
Pt left vm stating he was recently discharged from hospital and is still having trouble breathing. I called pt back to get more information no answer/left vm for pt to return my call.

## 2021-05-10 ENCOUNTER — Inpatient Hospital Stay (HOSPITAL_COMMUNITY): Payer: Medicare HMO

## 2021-05-10 ENCOUNTER — Emergency Department (HOSPITAL_COMMUNITY): Payer: Medicare HMO

## 2021-05-10 DIAGNOSIS — Z20822 Contact with and (suspected) exposure to covid-19: Secondary | ICD-10-CM | POA: Diagnosis not present

## 2021-05-10 DIAGNOSIS — I10 Essential (primary) hypertension: Secondary | ICD-10-CM | POA: Diagnosis not present

## 2021-05-10 DIAGNOSIS — G4733 Obstructive sleep apnea (adult) (pediatric): Secondary | ICD-10-CM | POA: Diagnosis not present

## 2021-05-10 DIAGNOSIS — J9601 Acute respiratory failure with hypoxia: Secondary | ICD-10-CM | POA: Diagnosis not present

## 2021-05-10 DIAGNOSIS — J9690 Respiratory failure, unspecified, unspecified whether with hypoxia or hypercapnia: Secondary | ICD-10-CM | POA: Diagnosis present

## 2021-05-10 DIAGNOSIS — J810 Acute pulmonary edema: Secondary | ICD-10-CM | POA: Diagnosis not present

## 2021-05-10 DIAGNOSIS — I509 Heart failure, unspecified: Secondary | ICD-10-CM | POA: Diagnosis not present

## 2021-05-10 DIAGNOSIS — I462 Cardiac arrest due to underlying cardiac condition: Secondary | ICD-10-CM | POA: Diagnosis not present

## 2021-05-10 DIAGNOSIS — I130 Hypertensive heart and chronic kidney disease with heart failure and stage 1 through stage 4 chronic kidney disease, or unspecified chronic kidney disease: Secondary | ICD-10-CM | POA: Diagnosis not present

## 2021-05-10 DIAGNOSIS — I340 Nonrheumatic mitral (valve) insufficiency: Secondary | ICD-10-CM | POA: Diagnosis not present

## 2021-05-10 DIAGNOSIS — I4891 Unspecified atrial fibrillation: Secondary | ICD-10-CM | POA: Diagnosis not present

## 2021-05-10 DIAGNOSIS — Z86711 Personal history of pulmonary embolism: Secondary | ICD-10-CM | POA: Diagnosis not present

## 2021-05-10 DIAGNOSIS — Z7901 Long term (current) use of anticoagulants: Secondary | ICD-10-CM | POA: Diagnosis not present

## 2021-05-10 DIAGNOSIS — I428 Other cardiomyopathies: Secondary | ICD-10-CM

## 2021-05-10 DIAGNOSIS — I110 Hypertensive heart disease with heart failure: Secondary | ICD-10-CM | POA: Diagnosis not present

## 2021-05-10 DIAGNOSIS — J80 Acute respiratory distress syndrome: Secondary | ICD-10-CM | POA: Diagnosis not present

## 2021-05-10 DIAGNOSIS — K8020 Calculus of gallbladder without cholecystitis without obstruction: Secondary | ICD-10-CM | POA: Diagnosis not present

## 2021-05-10 DIAGNOSIS — R0602 Shortness of breath: Secondary | ICD-10-CM | POA: Diagnosis not present

## 2021-05-10 DIAGNOSIS — R579 Shock, unspecified: Secondary | ICD-10-CM | POA: Diagnosis not present

## 2021-05-10 DIAGNOSIS — R042 Hemoptysis: Secondary | ICD-10-CM | POA: Diagnosis present

## 2021-05-10 DIAGNOSIS — I2699 Other pulmonary embolism without acute cor pulmonale: Secondary | ICD-10-CM | POA: Diagnosis not present

## 2021-05-10 DIAGNOSIS — I480 Paroxysmal atrial fibrillation: Secondary | ICD-10-CM | POA: Diagnosis not present

## 2021-05-10 DIAGNOSIS — I5042 Chronic combined systolic (congestive) and diastolic (congestive) heart failure: Secondary | ICD-10-CM | POA: Diagnosis not present

## 2021-05-10 DIAGNOSIS — I472 Ventricular tachycardia: Secondary | ICD-10-CM | POA: Diagnosis not present

## 2021-05-10 DIAGNOSIS — I482 Chronic atrial fibrillation, unspecified: Secondary | ICD-10-CM | POA: Diagnosis present

## 2021-05-10 DIAGNOSIS — Z6841 Body Mass Index (BMI) 40.0 and over, adult: Secondary | ICD-10-CM | POA: Diagnosis not present

## 2021-05-10 DIAGNOSIS — E1165 Type 2 diabetes mellitus with hyperglycemia: Secondary | ICD-10-CM | POA: Diagnosis present

## 2021-05-10 DIAGNOSIS — I4901 Ventricular fibrillation: Secondary | ICD-10-CM | POA: Diagnosis not present

## 2021-05-10 DIAGNOSIS — Z79899 Other long term (current) drug therapy: Secondary | ICD-10-CM | POA: Diagnosis not present

## 2021-05-10 DIAGNOSIS — M25552 Pain in left hip: Secondary | ICD-10-CM | POA: Diagnosis not present

## 2021-05-10 DIAGNOSIS — M7989 Other specified soft tissue disorders: Secondary | ICD-10-CM | POA: Diagnosis not present

## 2021-05-10 DIAGNOSIS — F319 Bipolar disorder, unspecified: Secondary | ICD-10-CM | POA: Diagnosis present

## 2021-05-10 DIAGNOSIS — N179 Acute kidney failure, unspecified: Secondary | ICD-10-CM | POA: Diagnosis not present

## 2021-05-10 DIAGNOSIS — I517 Cardiomegaly: Secondary | ICD-10-CM | POA: Diagnosis not present

## 2021-05-10 DIAGNOSIS — N182 Chronic kidney disease, stage 2 (mild): Secondary | ICD-10-CM | POA: Diagnosis present

## 2021-05-10 DIAGNOSIS — J189 Pneumonia, unspecified organism: Secondary | ICD-10-CM | POA: Diagnosis not present

## 2021-05-10 DIAGNOSIS — R609 Edema, unspecified: Secondary | ICD-10-CM | POA: Diagnosis not present

## 2021-05-10 DIAGNOSIS — I5023 Acute on chronic systolic (congestive) heart failure: Secondary | ICD-10-CM | POA: Diagnosis not present

## 2021-05-10 DIAGNOSIS — Z4682 Encounter for fitting and adjustment of non-vascular catheter: Secondary | ICD-10-CM | POA: Diagnosis not present

## 2021-05-10 DIAGNOSIS — Z888 Allergy status to other drugs, medicaments and biological substances status: Secondary | ICD-10-CM | POA: Diagnosis not present

## 2021-05-10 DIAGNOSIS — E1122 Type 2 diabetes mellitus with diabetic chronic kidney disease: Secondary | ICD-10-CM | POA: Diagnosis present

## 2021-05-10 DIAGNOSIS — Z9114 Patient's other noncompliance with medication regimen: Secondary | ICD-10-CM | POA: Diagnosis not present

## 2021-05-10 DIAGNOSIS — R079 Chest pain, unspecified: Secondary | ICD-10-CM | POA: Diagnosis not present

## 2021-05-10 DIAGNOSIS — I5043 Acute on chronic combined systolic (congestive) and diastolic (congestive) heart failure: Secondary | ICD-10-CM | POA: Diagnosis not present

## 2021-05-10 LAB — I-STAT ARTERIAL BLOOD GAS, ED
Acid-base deficit: 3 mmol/L — ABNORMAL HIGH (ref 0.0–2.0)
Bicarbonate: 22.4 mmol/L (ref 20.0–28.0)
Calcium, Ion: 1.15 mmol/L (ref 1.15–1.40)
HCT: 51 % (ref 39.0–52.0)
Hemoglobin: 17.3 g/dL — ABNORMAL HIGH (ref 13.0–17.0)
O2 Saturation: 90 %
Patient temperature: 98.6
Potassium: 3.8 mmol/L (ref 3.5–5.1)
Sodium: 143 mmol/L (ref 135–145)
TCO2: 24 mmol/L (ref 22–32)
pCO2 arterial: 40.3 mmHg (ref 32.0–48.0)
pH, Arterial: 7.352 (ref 7.350–7.450)
pO2, Arterial: 61 mmHg — ABNORMAL LOW (ref 83.0–108.0)

## 2021-05-10 LAB — POCT I-STAT 7, (LYTES, BLD GAS, ICA,H+H)
Acid-base deficit: 4 mmol/L — ABNORMAL HIGH (ref 0.0–2.0)
Acid-base deficit: 2 mmol/L (ref 0.0–2.0)
Acid-base deficit: 2 mmol/L (ref 0.0–2.0)
Bicarbonate: 27.2 mmol/L (ref 20.0–28.0)
Bicarbonate: 25.7 mmol/L (ref 20.0–28.0)
Bicarbonate: 25.6 mmol/L (ref 20.0–28.0)
Calcium, Ion: 1.19 mmol/L (ref 1.15–1.40)
Calcium, Ion: 1.14 mmol/L — ABNORMAL LOW (ref 1.15–1.40)
Calcium, Ion: 1.13 mmol/L — ABNORMAL LOW (ref 1.15–1.40)
HCT: 55 % — ABNORMAL HIGH (ref 39.0–52.0)
HCT: 52 % (ref 39.0–52.0)
HCT: 51 % (ref 39.0–52.0)
Hemoglobin: 18.7 g/dL — ABNORMAL HIGH (ref 13.0–17.0)
Hemoglobin: 17.7 g/dL — ABNORMAL HIGH (ref 13.0–17.0)
Hemoglobin: 17.3 g/dL — ABNORMAL HIGH (ref 13.0–17.0)
O2 Saturation: 91 %
O2 Saturation: 100 %
O2 Saturation: 99 %
Patient temperature: 98.6
Patient temperature: 100.1
Patient temperature: 100
Potassium: 3.3 mmol/L — ABNORMAL LOW (ref 3.5–5.1)
Potassium: 3.4 mmol/L — ABNORMAL LOW (ref 3.5–5.1)
Potassium: 3.8 mmol/L (ref 3.5–5.1)
Sodium: 144 mmol/L (ref 135–145)
Sodium: 144 mmol/L (ref 135–145)
Sodium: 143 mmol/L (ref 135–145)
TCO2: 29 mmol/L (ref 22–32)
TCO2: 27 mmol/L (ref 22–32)
TCO2: 27 mmol/L (ref 22–32)
pCO2 arterial: 72.9 mmHg (ref 32.0–48.0)
pCO2 arterial: 54.7 mmHg — ABNORMAL HIGH (ref 32.0–48.0)
pCO2 arterial: 55.7 mmHg — ABNORMAL HIGH (ref 32.0–48.0)
pH, Arterial: 7.181 — CL (ref 7.350–7.450)
pH, Arterial: 7.285 — ABNORMAL LOW (ref 7.350–7.450)
pH, Arterial: 7.274 — ABNORMAL LOW (ref 7.350–7.450)
pO2, Arterial: 80 mmHg — ABNORMAL LOW (ref 83.0–108.0)
pO2, Arterial: 229 mmHg — ABNORMAL HIGH (ref 83.0–108.0)
pO2, Arterial: 152 mmHg — ABNORMAL HIGH (ref 83.0–108.0)

## 2021-05-10 LAB — COMPREHENSIVE METABOLIC PANEL
ALT: 53 U/L — ABNORMAL HIGH (ref 0–44)
AST: 39 U/L (ref 15–41)
Albumin: 3.6 g/dL (ref 3.5–5.0)
Alkaline Phosphatase: 189 U/L — ABNORMAL HIGH (ref 38–126)
Anion gap: 13 (ref 5–15)
BUN: 16 mg/dL (ref 6–20)
CO2: 22 mmol/L (ref 22–32)
Calcium: 8.7 mg/dL — ABNORMAL LOW (ref 8.9–10.3)
Chloride: 105 mmol/L (ref 98–111)
Creatinine, Ser: 1.42 mg/dL — ABNORMAL HIGH (ref 0.61–1.24)
GFR, Estimated: 59 mL/min — ABNORMAL LOW (ref >60)
Glucose, Bld: 120 mg/dL — ABNORMAL HIGH (ref 70–99)
Potassium: 3.6 mmol/L (ref 3.5–5.1)
Sodium: 140 mmol/L (ref 135–145)
Total Bilirubin: 0.9 mg/dL (ref 0.3–1.2)
Total Protein: 7.8 g/dL (ref 6.5–8.1)

## 2021-05-10 LAB — PROTIME-INR
INR: 1.3 — ABNORMAL HIGH (ref 0.8–1.2)
Prothrombin Time: 16.5 seconds — ABNORMAL HIGH (ref 11.4–15.2)

## 2021-05-10 LAB — ECHOCARDIOGRAM LIMITED
AR max vel: 5 cm2
AV Area VTI: 4.98 cm2
AV Area mean vel: 4.01 cm2
AV Mean grad: 2 mmHg
AV Peak grad: 2.7 mmHg
Ao pk vel: 0.83 m/s
Area-P 1/2: 4.04 cm2
Height: 68 in
S' Lateral: 7 cm
Weight: 6080 oz

## 2021-05-10 LAB — CBC WITH DIFFERENTIAL/PLATELET
Abs Immature Granulocytes: 0.03 10*3/uL (ref 0.00–0.07)
Basophils Absolute: 0.1 10*3/uL (ref 0.0–0.1)
Basophils Relative: 1 %
Eosinophils Absolute: 0.1 10*3/uL (ref 0.0–0.5)
Eosinophils Relative: 1 %
HCT: 45.3 % (ref 39.0–52.0)
Hemoglobin: 15.8 g/dL (ref 13.0–17.0)
Immature Granulocytes: 0 %
Lymphocytes Relative: 52 %
Lymphs Abs: 5.5 10*3/uL — ABNORMAL HIGH (ref 0.7–4.0)
MCH: 29.5 pg (ref 26.0–34.0)
MCHC: 34.9 g/dL (ref 30.0–36.0)
MCV: 84.7 fL (ref 80.0–100.0)
Monocytes Absolute: 0.9 10*3/uL (ref 0.1–1.0)
Monocytes Relative: 9 %
Neutro Abs: 3.9 10*3/uL (ref 1.7–7.7)
Neutrophils Relative %: 37 %
Platelets: 302 10*3/uL (ref 150–400)
RBC: 5.35 MIL/uL (ref 4.22–5.81)
RDW: 13.9 % (ref 11.5–15.5)
WBC: 10.1 10*3/uL (ref 4.0–10.5)
nRBC: 0 % (ref 0.0–0.2)

## 2021-05-10 LAB — COOXEMETRY PANEL
Carboxyhemoglobin: 0.4 % — ABNORMAL LOW (ref 0.5–1.5)
Methemoglobin: 0.9 % (ref 0.0–1.5)
O2 Saturation: 79.6 %
Total hemoglobin: 17.5 g/dL — ABNORMAL HIGH (ref 12.0–16.0)

## 2021-05-10 LAB — HEMOGLOBIN A1C
Hgb A1c MFr Bld: 6.2 % — ABNORMAL HIGH (ref 4.8–5.6)
Mean Plasma Glucose: 131 mg/dL

## 2021-05-10 LAB — BASIC METABOLIC PANEL
Anion gap: 12 (ref 5–15)
BUN: 22 mg/dL — ABNORMAL HIGH (ref 6–20)
CO2: 23 mmol/L (ref 22–32)
Calcium: 8.5 mg/dL — ABNORMAL LOW (ref 8.9–10.3)
Chloride: 103 mmol/L (ref 98–111)
Creatinine, Ser: 2.27 mg/dL — ABNORMAL HIGH (ref 0.61–1.24)
GFR, Estimated: 34 mL/min — ABNORMAL LOW (ref >60)
Glucose, Bld: 201 mg/dL — ABNORMAL HIGH (ref 70–99)
Potassium: 4.1 mmol/L (ref 3.5–5.1)
Sodium: 138 mmol/L (ref 135–145)

## 2021-05-10 LAB — GLUCOSE, CAPILLARY
Glucose-Capillary: 172 mg/dL — ABNORMAL HIGH (ref 70–99)
Glucose-Capillary: 148 mg/dL — ABNORMAL HIGH (ref 70–99)
Glucose-Capillary: 205 mg/dL — ABNORMAL HIGH (ref 70–99)
Glucose-Capillary: 197 mg/dL — ABNORMAL HIGH (ref 70–99)
Glucose-Capillary: 192 mg/dL — ABNORMAL HIGH (ref 70–99)
Glucose-Capillary: 214 mg/dL — ABNORMAL HIGH (ref 70–99)

## 2021-05-10 LAB — BRAIN NATRIURETIC PEPTIDE: B Natriuretic Peptide: 464.3 pg/mL — ABNORMAL HIGH (ref 0.0–100.0)

## 2021-05-10 LAB — TROPONIN I (HIGH SENSITIVITY)
Troponin I (High Sensitivity): 19 ng/L — ABNORMAL HIGH (ref <18)
Troponin I (High Sensitivity): 23 ng/L — ABNORMAL HIGH (ref <18)

## 2021-05-10 LAB — MRSA PCR SCREENING: MRSA by PCR: NEGATIVE

## 2021-05-10 LAB — MAGNESIUM: Magnesium: 1.8 mg/dL (ref 1.7–2.4)

## 2021-05-10 LAB — APTT: aPTT: 25 seconds (ref 24–36)

## 2021-05-10 LAB — PROCALCITONIN: Procalcitonin: 0.35 ng/mL

## 2021-05-10 LAB — HIV ANTIBODY (ROUTINE TESTING W REFLEX): HIV Screen 4th Generation wRfx: NONREACTIVE

## 2021-05-10 MED ORDER — SODIUM CHLORIDE 0.9 % IV SOLN
2.0000 g | Freq: Once | INTRAVENOUS | Status: AC
  Administered 2021-05-10: 2 g via INTRAVENOUS
  Filled 2021-05-10: qty 20

## 2021-05-10 MED ORDER — PROPOFOL 1000 MG/100ML IV EMUL
25.0000 ug/kg/min | INTRAVENOUS | Status: DC
  Administered 2021-05-10 (×6): 40 ug/kg/min via INTRAVENOUS
  Administered 2021-05-10: 25 ug/kg/min via INTRAVENOUS
  Administered 2021-05-10 – 2021-05-11 (×2): 40 ug/kg/min via INTRAVENOUS
  Administered 2021-05-11: 30 ug/kg/min via INTRAVENOUS
  Administered 2021-05-11 (×2): 40 ug/kg/min via INTRAVENOUS
  Administered 2021-05-11 (×2): 30 ug/kg/min via INTRAVENOUS
  Administered 2021-05-11 (×3): 40 ug/kg/min via INTRAVENOUS
  Administered 2021-05-12: 25 ug/kg/min via INTRAVENOUS
  Administered 2021-05-12: 30 ug/kg/min via INTRAVENOUS
  Administered 2021-05-12: 25 ug/kg/min via INTRAVENOUS
  Filled 2021-05-10 (×18): qty 100

## 2021-05-10 MED ORDER — PROSOURCE TF PO LIQD
90.0000 mL | Freq: Three times a day (TID) | ORAL | Status: DC
  Administered 2021-05-10 – 2021-05-12 (×8): 90 mL
  Filled 2021-05-10 (×9): qty 90

## 2021-05-10 MED ORDER — IOHEXOL 350 MG/ML SOLN
75.0000 mL | Freq: Once | INTRAVENOUS | Status: AC | PRN
  Administered 2021-05-10: 75 mL via INTRAVENOUS

## 2021-05-10 MED ORDER — PERFLUTREN LIPID MICROSPHERE
1.0000 mL | INTRAVENOUS | Status: AC | PRN
  Administered 2021-05-10: 10 mL via INTRAVENOUS
  Filled 2021-05-10: qty 10

## 2021-05-10 MED ORDER — ORAL CARE MOUTH RINSE
15.0000 mL | OROMUCOSAL | Status: DC
  Administered 2021-05-10 – 2021-05-14 (×35): 15 mL via OROMUCOSAL

## 2021-05-10 MED ORDER — ROCURONIUM BROMIDE 50 MG/5ML IV SOLN
INTRAVENOUS | Status: AC | PRN
  Administered 2021-05-10: 100 mg via INTRAVENOUS

## 2021-05-10 MED ORDER — SODIUM CHLORIDE 0.9 % IV BOLUS
500.0000 mL | Freq: Once | INTRAVENOUS | Status: AC
  Administered 2021-05-10: 500 mL via INTRAVENOUS

## 2021-05-10 MED ORDER — PANTOPRAZOLE SODIUM 40 MG IV SOLR
40.0000 mg | Freq: Every day | INTRAVENOUS | Status: DC
  Administered 2021-05-10 – 2021-05-11 (×2): 40 mg via INTRAVENOUS
  Filled 2021-05-10 (×2): qty 40

## 2021-05-10 MED ORDER — FUROSEMIDE 10 MG/ML IJ SOLN
60.0000 mg | Freq: Once | INTRAMUSCULAR | Status: AC
  Administered 2021-05-10: 60 mg via INTRAVENOUS
  Filled 2021-05-10: qty 6

## 2021-05-10 MED ORDER — POTASSIUM CHLORIDE 20 MEQ PO PACK
40.0000 meq | PACK | Freq: Once | ORAL | Status: AC
  Administered 2021-05-10: 40 meq
  Filled 2021-05-10: qty 2

## 2021-05-10 MED ORDER — METOPROLOL TARTRATE 25 MG PO TABS: 50.0000 mg | ORAL_TABLET | Freq: Two times a day (BID) | ORAL | Status: DC

## 2021-05-10 MED ORDER — FENTANYL CITRATE (PF) 100 MCG/2ML IJ SOLN: 50.0000 ug | Freq: Once | INTRAMUSCULAR | Status: DC

## 2021-05-10 MED ORDER — SODIUM CHLORIDE 0.9% FLUSH
10.0000 mL | Freq: Two times a day (BID) | INTRAVENOUS | Status: DC
  Administered 2021-05-10 – 2021-05-16 (×14): 10 mL
  Administered 2021-05-17: 20 mL
  Administered 2021-05-17 – 2021-05-19 (×4): 10 mL

## 2021-05-10 MED ORDER — FENTANYL 2500MCG IN NS 250ML (10MCG/ML) PREMIX INFUSION
0.0000 ug/h | INTRAVENOUS | Status: DC
  Administered 2021-05-10: 50 ug/h via INTRAVENOUS
  Administered 2021-05-11 – 2021-05-12 (×2): 100 ug/h via INTRAVENOUS
  Filled 2021-05-10 (×3): qty 250

## 2021-05-10 MED ORDER — CLONAZEPAM 1 MG PO TABS
1.0000 mg | ORAL_TABLET | Freq: Two times a day (BID) | ORAL | Status: DC
  Administered 2021-05-10 – 2021-05-11 (×3): 1 mg via ORAL
  Filled 2021-05-10 (×3): qty 1

## 2021-05-10 MED ORDER — ARTIFICIAL TEARS OPHTHALMIC OINT
1.0000 "application " | TOPICAL_OINTMENT | Freq: Three times a day (TID) | OPHTHALMIC | Status: DC
  Administered 2021-05-10 – 2021-05-13 (×6): 1 via OPHTHALMIC
  Filled 2021-05-10: qty 3.5

## 2021-05-10 MED ORDER — CHLORHEXIDINE GLUCONATE CLOTH 2 % EX PADS
6.0000 | MEDICATED_PAD | Freq: Every day | CUTANEOUS | Status: DC
  Administered 2021-05-10 – 2021-05-19 (×9): 6 via TOPICAL

## 2021-05-10 MED ORDER — METHYLPREDNISOLONE SODIUM SUCC 125 MG IJ SOLR
60.0000 mg | Freq: Two times a day (BID) | INTRAMUSCULAR | Status: DC
  Administered 2021-05-10 – 2021-05-11 (×3): 60 mg via INTRAVENOUS
  Filled 2021-05-10 (×3): qty 2

## 2021-05-10 MED ORDER — SODIUM CHLORIDE 0.9 % IV SOLN
500.0000 mg | INTRAVENOUS | Status: DC
  Administered 2021-05-11: 500 mg via INTRAVENOUS
  Filled 2021-05-10: qty 500

## 2021-05-10 MED ORDER — BUDESONIDE 0.5 MG/2ML IN SUSP
0.5000 mg | Freq: Two times a day (BID) | RESPIRATORY_TRACT | Status: DC
  Administered 2021-05-11 – 2021-05-14 (×7): 0.5 mg via RESPIRATORY_TRACT
  Filled 2021-05-10 (×9): qty 2

## 2021-05-10 MED ORDER — LORAZEPAM 2 MG/ML IJ SOLN
1.0000 mg | Freq: Once | INTRAMUSCULAR | Status: AC
  Administered 2021-05-10: 1 mg via INTRAVENOUS
  Filled 2021-05-10: qty 1

## 2021-05-10 MED ORDER — SODIUM CHLORIDE 0.9 % IV SOLN
2.0000 g | INTRAVENOUS | Status: DC
  Administered 2021-05-11: 2 g via INTRAVENOUS
  Filled 2021-05-10: qty 20

## 2021-05-10 MED ORDER — MAGNESIUM SULFATE 2 GM/50ML IV SOLN
2.0000 g | Freq: Once | INTRAVENOUS | Status: AC
  Administered 2021-05-10: 2 g via INTRAVENOUS
  Filled 2021-05-10: qty 50

## 2021-05-10 MED ORDER — DEXTROSE 50 % IV SOLN: 0.0000 mL | INTRAVENOUS | Status: DC | PRN

## 2021-05-10 MED ORDER — FENTANYL CITRATE (PF) 100 MCG/2ML IJ SOLN
50.0000 ug | Freq: Once | INTRAMUSCULAR | Status: AC
Start: 2021-05-10 — End: 2021-05-10
  Administered 2021-05-10: 50 ug via INTRAVENOUS
  Filled 2021-05-10: qty 2

## 2021-05-10 MED ORDER — PROPOFOL 1000 MG/100ML IV EMUL
INTRAVENOUS | Status: AC
  Filled 2021-05-10: qty 100

## 2021-05-10 MED ORDER — SODIUM CHLORIDE 0.9% FLUSH: 10.0000 mL | INTRAVENOUS | Status: DC | PRN

## 2021-05-10 MED ORDER — FUROSEMIDE 10 MG/ML IJ SOLN
40.0000 mg | Freq: Once | INTRAMUSCULAR | Status: AC
  Administered 2021-05-10: 40 mg via INTRAVENOUS
  Filled 2021-05-10: qty 4

## 2021-05-10 MED ORDER — PROPOFOL 1000 MG/100ML IV EMUL
5.0000 ug/kg/min | INTRAVENOUS | Status: DC
  Administered 2021-05-10: 19.915 ug/kg/min via INTRAVENOUS

## 2021-05-10 MED ORDER — LACTATED RINGERS IV SOLN
INTRAVENOUS | Status: AC | PRN
  Administered 2021-05-10: 999 mL/h via INTRAVENOUS

## 2021-05-10 MED ORDER — STERILE WATER FOR INJECTION IJ SOLN
50.0000 ng/kg/min | INTRAVENOUS | Status: DC
  Administered 2021-05-10 – 2021-05-11 (×5): 50 ng/kg/min via RESPIRATORY_TRACT
  Administered 2021-05-11: 40 ng/kg/min via RESPIRATORY_TRACT
  Administered 2021-05-11: 10 ng/kg/min via RESPIRATORY_TRACT
  Administered 2021-05-11: 20 ng/kg/min via RESPIRATORY_TRACT
  Administered 2021-05-11: 30 ng/kg/min via RESPIRATORY_TRACT
  Filled 2021-05-10 (×8): qty 5

## 2021-05-10 MED ORDER — CHLORHEXIDINE GLUCONATE 0.12% ORAL RINSE (MEDLINE KIT)
15.0000 mL | Freq: Two times a day (BID) | OROMUCOSAL | Status: DC
  Administered 2021-05-10 – 2021-05-15 (×11): 15 mL via OROMUCOSAL

## 2021-05-10 MED ORDER — ETOMIDATE 2 MG/ML IV SOLN
INTRAVENOUS | Status: AC | PRN
  Administered 2021-05-10: 30 mg via INTRAVENOUS

## 2021-05-10 MED ORDER — ARTIFICIAL TEARS OPHTHALMIC OINT: 1.0000 "application " | TOPICAL_OINTMENT | Freq: Three times a day (TID) | OPHTHALMIC | Status: DC

## 2021-05-10 MED ORDER — SODIUM CHLORIDE 0.9 % IV SOLN
500.0000 mg | Freq: Once | INTRAVENOUS | Status: AC
  Administered 2021-05-10: 500 mg via INTRAVENOUS
  Filled 2021-05-10: qty 500

## 2021-05-10 MED ORDER — NOREPINEPHRINE 4 MG/250ML-% IV SOLN
0.0000 ug/min | INTRAVENOUS | Status: DC
  Administered 2021-05-10: 12 ug/min via INTRAVENOUS
  Administered 2021-05-10: 4 ug/min via INTRAVENOUS
  Administered 2021-05-10: 11 ug/min via INTRAVENOUS
  Administered 2021-05-11: 5 ug/min via INTRAVENOUS
  Administered 2021-05-11: 11 ug/min via INTRAVENOUS
  Administered 2021-05-12 – 2021-05-14 (×2): 2 ug/min via INTRAVENOUS
  Filled 2021-05-10 (×6): qty 250

## 2021-05-10 MED ORDER — TRANEXAMIC ACID FOR INHALATION
500.0000 mg | Freq: Once | RESPIRATORY_TRACT | Status: AC
  Administered 2021-05-10: 500 mg via RESPIRATORY_TRACT
  Filled 2021-05-10: qty 10

## 2021-05-10 MED ORDER — POLYETHYLENE GLYCOL 3350 17 G PO PACK: 17.0000 g | PACK | Freq: Every day | ORAL | Status: DC | PRN

## 2021-05-10 MED ORDER — NOREPINEPHRINE 4 MG/250ML-% IV SOLN
INTRAVENOUS | Status: AC
  Filled 2021-05-10: qty 250

## 2021-05-10 MED ORDER — POTASSIUM CHLORIDE 10 MEQ/100ML IV SOLN: 10.0000 meq | INTRAVENOUS | Status: DC

## 2021-05-10 MED ORDER — FENTANYL BOLUS VIA INFUSION
50.0000 ug | INTRAVENOUS | Status: DC | PRN
  Administered 2021-05-10: 100 ug via INTRAVENOUS
  Filled 2021-05-10: qty 50

## 2021-05-10 MED ORDER — INSULIN REGULAR(HUMAN) IN NACL 100-0.9 UT/100ML-% IV SOLN
INTRAVENOUS | Status: DC
  Filled 2021-05-10 (×2): qty 100

## 2021-05-10 MED ORDER — INSULIN ASPART 100 UNIT/ML IJ SOLN
0.0000 [IU] | INTRAMUSCULAR | Status: DC
  Administered 2021-05-10: 4 [IU] via SUBCUTANEOUS
  Administered 2021-05-10 (×2): 7 [IU] via SUBCUTANEOUS
  Administered 2021-05-10: 4 [IU] via SUBCUTANEOUS
  Administered 2021-05-10: 3 [IU] via SUBCUTANEOUS
  Administered 2021-05-11 (×3): 7 [IU] via SUBCUTANEOUS
  Administered 2021-05-11: 4 [IU] via SUBCUTANEOUS
  Administered 2021-05-11: 7 [IU] via SUBCUTANEOUS
  Administered 2021-05-11: 4 [IU] via SUBCUTANEOUS
  Administered 2021-05-12 – 2021-05-13 (×5): 3 [IU] via SUBCUTANEOUS
  Administered 2021-05-13: 4 [IU] via SUBCUTANEOUS
  Administered 2021-05-13 (×2): 3 [IU] via SUBCUTANEOUS
  Administered 2021-05-13: 4 [IU] via SUBCUTANEOUS
  Administered 2021-05-14: 3 [IU] via SUBCUTANEOUS
  Administered 2021-05-14: 4 [IU] via SUBCUTANEOUS
  Administered 2021-05-14 (×2): 3 [IU] via SUBCUTANEOUS
  Administered 2021-05-14 – 2021-05-15 (×3): 4 [IU] via SUBCUTANEOUS
  Administered 2021-05-15: 3 [IU] via SUBCUTANEOUS
  Administered 2021-05-15: 4 [IU] via SUBCUTANEOUS
  Administered 2021-05-15: 3 [IU] via SUBCUTANEOUS
  Administered 2021-05-15: 7 [IU] via SUBCUTANEOUS
  Administered 2021-05-15 – 2021-05-16 (×2): 3 [IU] via SUBCUTANEOUS
  Administered 2021-05-16: 4 [IU] via SUBCUTANEOUS
  Administered 2021-05-16: 3 [IU] via SUBCUTANEOUS
  Administered 2021-05-16 – 2021-05-17 (×2): 4 [IU] via SUBCUTANEOUS
  Administered 2021-05-17 – 2021-05-18 (×6): 3 [IU] via SUBCUTANEOUS
  Administered 2021-05-18: 4 [IU] via SUBCUTANEOUS

## 2021-05-10 MED ORDER — DOCUSATE SODIUM 100 MG PO CAPS: 100.0000 mg | ORAL_CAPSULE | Freq: Two times a day (BID) | ORAL | Status: DC | PRN

## 2021-05-10 MED ORDER — ROCURONIUM BROMIDE 10 MG/ML (PF) SYRINGE: 100.0000 mg | PREFILLED_SYRINGE | INTRAVENOUS | Status: DC | PRN

## 2021-05-10 MED ORDER — VITAL HIGH PROTEIN PO LIQD
1000.0000 mL | ORAL | Status: DC
  Administered 2021-05-10 – 2021-05-12 (×3): 1000 mL

## 2021-05-10 NOTE — Progress Notes (Signed)
Echocardiogram 2D Echocardiogram has been performed by Marc Morgans RDCS.  [name] 05/10/2021, 9:18 AM

## 2021-05-10 NOTE — ED Notes (Signed)
Pt places on 3 l [place]

## 2021-05-10 NOTE — Progress Notes (Addendum)
55 year old male who was admitted overnight with acute hypoxic respiratory failure in the setting of acute on chronic systolic/diastolic congestive heart failure.  He was agitated, requiring deep sedation with propofol and fentanyl with RASS goal -4 During my examination he was 100% FiO2 and PEEP of 18, serial ABGs ABGs were drawn showed PaO2 over 200, then 150, FiO2 was trended down now it is 50 and PEEP of 16.  His echocardiogram was repeated showed an EF of less than 20% with global hypokinesis   CoOX is 53 though he looks like in heart failure  He became hypotensive requiring IV Levophed infusion, given 60 mg of IV Lasix with minimal urine output Repeat BMP in the afternoon   Patient's wife was updated at bedside, patient mother was updated over the phone   Additional critical care time: 30 minutes  Performed by: [place] care time was exclusive of separately billable procedures and treating other patients.   Critical care was necessary to treat or prevent imminent or life-threatening deterioration.   Critical care was time spent personally by me on the following activities: development of treatment plan with patient and/or surrogate as well as nursing, discussions with consultants, evaluation of patient's response to treatment, examination of patient, obtaining history from patient or surrogate, ordering and performing treatments and interventions, ordering and review of laboratory studies, ordering and review of radiographic studies, pulse oximetry and re-evaluation of patient's condition.   Jacky Kindle MD [facility] See Amion for pager If no response to pager, please call [phone] until 7pm After 7pm, Please call E-link [phone]

## 2021-05-10 NOTE — Progress Notes (Signed)
eLink Physician-Brief Progress Note Patient Name: [name] DOB: 02-10-1966 MRN: [id number]   Date of Service  05/10/2021  HPI/Events of Note  Patient admitted with acute hypoxemic respiratory failure secondary to pneumonia and possibly CHF, patient also had hemoptysis, and gives a history of vaping at home prior to  hospitalization, he was intubated in the ED and is on high ventilator settings.  eICU Interventions  New Patient Evaluation.        Kerry Kass [name] 05/10/2021, 6:29 AM

## 2021-05-10 NOTE — Consult Note (Signed)
NAME:  [name], MRN:  [id number], DOB:  [birth year], LOS: 0 ADMISSION DATE:  05/09/2021, CONSULTATION DATE:  05/10/21 REFERRING MD:  EDP, CHIEF COMPLAINT:  SOB, chest pain   History of Present Illness:  55 year old man with hx of systolic heart falure, prior VTE who presented with shortness of breath. This has been refractory to bronchodilators.  In ER, received some fluids and got worse.  Placed on BIPAP but began having possible frank hemoptysis so intubated for airway protection.  CTA read as multifocal pneumonia although this was underwhelming on my read.  Currently on high vent requirements and PCCM to admit.  Pertinent  Medical History  Metabolic syndrome CHF Afib and prior VTE on AC Alcohol abuse Vaping Bipolar 1  Significant Hospital Events: Including procedures, antibiotic start and stop dates in addition to other pertinent events   6/10 intubation, CVL, admission  Interim History / Subjective:  consulted  Objective   Blood pressure (!) 168/114, pulse (!) 110, temperature 97.8 F (36.6 C), temperature source Oral, resp. rate 10, height _0  (1.727 m), weight (!) 172.4 kg, SpO2 (!) 78 %.       No intake or output data in the 24 hours ending 05/10/21 0527 Filed Weights   05/09/21 2132  Weight: (!) 172.4 kg    Examination: General: ill appearing man on vent HENT: ETT with bloody output, OGT with bloody output Lungs: crackles worse on R, passive on vent with good driving pressure ~48 cm H2o (plat 31, PEEP 18) Cardiovascular: Irregular, afib on monitor Abdomen: Soft, hypoactive BS Extremities: Trace pitting edema Neuro: sedated/paralyzed from induction, was neurologically intact pre intubation GU: foley to be placed  Labs/imaging that I havepersonally reviewed  (right click and "Reselect all SmartList Selections" daily)  Cr up a bit K/mg slightly low CBC benign Questionable peribronchovascular ggo infiltrate in lower lobes which could just be  edema/hemorrhage  Resolved Hospital Problem list   N/a  Assessment & Plan:  Acute hypoxemic respiratory failure- He has hemoptysis and peri-bronchovascular GGO in setting of trying vaping at home.  Differential includes pulmonary edema w/ hemorrhage vs. Inhalational reaction to vaping vs. CAP.  He has no evidence of bronchospasm on vent. - Placed on pretty high vent settings, sats remain marginal; may benefit from veletri when upstairs; not sure he will be prone-able given size - PAD protocol target RASS -4 to -5, will add NMB as we do not have much wiggle room with his P/F of 65 with PEEP 18 - Hold AC given hemoptysis degree - May benefit from serial lavage w/ BAL for diagnostic purposes but need to weigh this against potential further de-recruitment; will discuss with day team - CVL with CVP and Coox as well as limited echo to get some idea of filling pressures, consider CHF consult (Dr. Claris Gladden outpatient) - Ceftriaxone/azithromycin, nebulized and systemic steroids; if worsening hemoptysis add nebulized TXA but seems to be better with high PEEP - Check Pct, tracheal aspirate  Baseline NICM, AKI- near his dry weight, get CVP/echo before deciding how much to push diuresis, given fluids followed by lasix in ER Hx of Afib/VTE on AC,HTN, HLD, Bipolar, severe OSA- ok for home BB if Bps tolerate; hold AC and other home meds DM2 with hyperglycemia- insulin gtt for now   Best practice (right click and "Reselect all SmartList Selections" daily)  Diet:  NPO Pain/Anxiety/Delirium protocol (if indicated): No VAP protocol (if indicated): Yes DVT prophylaxis: Contraindicated GI prophylaxis: PPI Glucose control:  SSI  No Central venous access:  Yes, and it is still needed Arterial line:  Yes, and it is still needed Foley:  Yes, and it is still needed Mobility:  bed rest  PT consulted: N/A Last date of multidisciplinary goals of care discussion [pending, significant other updated at bedside] Code  Status:  full code Disposition: ICU  Labs   CBC: Recent Labs  Lab 05/09/21 2230 05/10/21 0424  WBC 10.1  --   NEUTROABS 3.9  --   HGB 15.8 17.3*  HCT 45.3 51.0  MCV 84.7  --   PLT 302  --     Basic Metabolic Panel: Recent Labs  Lab 05/09/21 2230 05/10/21 0424  NA 140 143  K 3.6 3.8  CL 105  --   CO2 22  --   GLUCOSE 120*  --   BUN 16  --   CREATININE 1.42*  --   CALCIUM 8.7*  --   MG 1.8  --    GFR: Estimated Creatinine Clearance: 94.7 mL/min (A) (by C-G formula based on SCr of 1.42 mg/dL (H)). Recent Labs  Lab 05/09/21 2230  WBC 10.1    Liver Function Tests: Recent Labs  Lab 05/09/21 2230  AST 39  ALT 53*  ALKPHOS 189*  BILITOT 0.9  PROT 7.8  ALBUMIN 3.6   No results for input(s): LIPASE, AMYLASE in the last 168 hours. No results for input(s): AMMONIA in the last 168 hours.  ABG    Component Value Date/Time   PHART 7.352 05/10/2021 0424   PCO2ART 40.3 05/10/2021 0424   PO2ART 61 (L) 05/10/2021 0424   HCO3 22.4 05/10/2021 0424   TCO2 24 05/10/2021 0424   ACIDBASEDEF 3.0 (H) 05/10/2021 0424   O2SAT 90.0 05/10/2021 0424     Coagulation Profile: No results for input(s): INR, PROTIME in the last 168 hours.  Cardiac Enzymes: No results for input(s): CKTOTAL, CKMB, CKMBINDEX, TROPONINI in the last 168 hours.  HbA1C: Hgb A1C (fingerstick)  Date/Time Value Ref Range Status  12/25/2015 10:55 AM 6.9 (H) <5.7 % Final    Comment:                                                                           According to the ADA Clinical Practice Recommendations for 2011, when HbA1c is used as a screening test:     >=6.5%   Diagnostic of Diabetes Mellitus            (if abnormal result is confirmed)   5.7-6.4%   Increased risk of developing Diabetes Mellitus   References:Diagnosis and Classification of Diabetes Mellitus,Diabetes VQQV,9563,87(FIEPP 1):S62-S69 and Standards of Medical Care in         Diabetes - 2011,Diabetes Care,2011,34 (Suppl  1):S11-S61.      Hemoglobin A1C  Date/Time Value Ref Range Status  11/19/2020 02:28 PM 6.2 (A) 4.0 - 5.6 % Final  05/25/2018 12:00 AM 8.7  Final  05/25/2018 12:00 AM 8.7  Final   Hgb A1c MFr Bld  Date/Time Value Ref Range Status  02/22/2021 12:11 PM 5.8 (H) 4.8 - 5.6 % Final    Comment:    (NOTE) Pre diabetes:          5.7%-6.4%  Diabetes:              >  6.4%  Glycemic control for   <7.0% adults with diabetes   03/21/2020 02:55 PM 5.5 4.6 - 6.5 % Final    Comment:    Glycemic Control Guidelines for People with Diabetes:Non Diabetic:  <6%Goal of Therapy: <7%Additional Action Suggested:  >8%     CBG: No results for input(s): GLUCAP in the last 168 hours.  Review of Systems:   Cannot assess, intubated and sedated  Past Medical History:  He,  has a past medical history of Alcohol abuse, Anxiety state, unspecified, Atrial fibrillation ([place]), CHF (congestive heart failure) ([place]), Chronic systolic heart failure ([place]), Diabetes mellitus, type II ([place]), Edema, Gout, History of medication noncompliance, Migraine, Obesity, unspecified, Obstructive sleep apnea, Psychiatric disorder, Pulmonary embolism ([place]), and Shortness of breath.   Surgical History:   Past Surgical History:  Procedure Laterality Date   CARDIAC CATHETERIZATION     CARDIAC CATHETERIZATION N/A 08/13/2015   Procedure: Right/Left Heart Cath and Coronary Angiography;  Surgeon: [name], MD;  Location: [facility];  Service: Cardiovascular;  Laterality: N/A;   RIGHT/LEFT HEART CATH AND CORONARY ANGIOGRAPHY N/A 04/14/2017   Procedure: Right/Left Heart Cath and Coronary Angiography;  Surgeon: [name], MD;  [facility];  Service: Cardiovascular;  Laterality: N/A;   TESTICLE SURGERY       Social History:   reports that he quit smoking about a year ago. His smoking use included cigarettes. He has a 15.00 pack-year smoking history. He has never used smokeless tobacco. He reports  previous alcohol use. He reports that he does not use drugs.   Family History:  His family history includes Cancer in his mother; Diabetes in his daughter, father, and another family member; Heart disease in his maternal grandmother; Hypertension in his mother and another family member; Stroke in an other family member.   Allergies Allergies  Allergen Reactions   Ace Inhibitors Anaphylaxis and Swelling    Angioedema   Bidil [Isosorb Dinitrate-Hydralazine] Other (See Comments)    headache   Digoxin And Related     Unspecified "side effects"   Buspirone Other (See Comments)    dizziness     Home Medications  Prior to Admission medications   Medication Sig Start Date End Date Taking? Authorizing Provider  albuterol (VENTOLIN HFA) 108 (90 Base) MCG/ACT inhaler Inhale 1-2 puffs into the lungs every 4 (four) hours as needed for shortness of breath or wheezing. 05/08/21  Yes [provider]  ARIPiprazole (ABILIFY) 10 MG tablet Take 1 tablet (10 mg total) by mouth daily. 03/21/21  Yes Nevada Crane, MD  atorvastatin (LIPITOR) 40 MG tablet Take 1 tablet (40 mg total) by mouth daily. 11/20/20  Yes [name], MD  blood glucose meter kit and supplies KIT Use to check blood sugar. DX E11.9 04/30/18  Yes [name], FNP  clonazePAM (KLONOPIN) 1 MG tablet Take 1 tablet (1 mg total) by mouth 2 (two) times daily as needed for anxiety. 03/21/21  Yes Nevada Crane, MD  FARXIGA 10 MG TABS tablet TAKE 1 TABLET BY MOUTH EVERY DAY Patient taking differently: Take 10 mg by mouth daily. 05/29/20  Yes [name], MD  losartan (COZAAR) 25 MG tablet Take 0.5 tablets (12.5 mg total) by mouth at bedtime. 02/22/21  Yes [name], MD  metoprolol succinate (TOPROL-XL) 100 MG 24 hr tablet TAKE 1 TABLET (100 MG TOTAL) BY MOUTH DAILY. TAKE WITH OR IMMEDIATELY FOLLOWING A MEAL. Patient taking differently: Take 100 mg by mouth.  05/30/20  Yes [name], MD  pantoprazole (PROTONIX) 40 MG  tablet TAKE 1 TABLET BY MOUTH EVERY DAY Patient taking differently: Take 40 mg by mouth daily. 04/04/21  Yes [name], MD  potassium chloride SA (KLOR-CON) 20 MEQ tablet Take 40 mEq by mouth daily as needed (cramps).   Yes [provider]  PRESCRIPTION MEDICATION Inhale into the lungs See admin instructions. Bipap, pressure 16/12 with 2L of O2 - use whenever sleeping   Yes [provider]  sertraline (ZOLOFT) 100 MG tablet Take 1 tablet (100 mg total) by mouth daily. 03/21/21  Yes [place] Crane, MD  sildenafil (REVATIO) 20 MG tablet Take 4 tablets (80 mg total) by mouth daily as needed. Patient taking differently: Take 80 mg by mouth daily as needed (ed). 03/21/20  Yes [name], MD  spironolactone (ALDACTONE) 25 MG tablet TAKE 1 TABLET (25 MG TOTAL) BY MOUTH DAILY. *MUST KEEP 12/20 APPOINTMENT FOR REFILLS* Patient taking differently: Take 25 mg by mouth daily. 12/10/20  Yes [name], MD  thiamine 100 MG tablet Take 1 tablet (100 mg total) by mouth daily. 11/22/20  Yes [name], MD  torsemide (DEMADEX) 20 MG tablet Take 1-1.5 tablets (20-30 mg total) by mouth daily. 04/18/21  Yes [name], MD  vitamin B-12 (CYANOCOBALAMIN) 1000 MCG tablet Take 1,000 mcg by mouth daily.   Yes [provider]  XARELTO 20 MG TABS tablet TAKE 1 TABLET BY MOUTH EVERY DAY Patient taking differently: Take 20 mg by mouth daily. 03/27/21  Yes [name], MD  pravastatin (PRAVACHOL) 40 MG tablet Take 40 mg by mouth daily.  02/20/12  [provider]     Critical care time: 58 minutes not including any separately billable procedures

## 2021-05-10 NOTE — Progress Notes (Signed)
Initial Nutrition Assessment  DOCUMENTATION CODES:   Morbid obesity  INTERVENTION:   Initiate tube feeding via OG tube: Vital High Protein at 40 ml/h (960 ml per day) Prosource TF 90 ml TID  Provides 1200 kcal (2285 kcal total with propofol), 150 gm protein, 803 ml free water daily.  NUTRITION DIAGNOSIS:   Inadequate oral intake related to inability to eat as evidenced by NPO status.  GOAL:   Provide needs based on ASPEN/SCCM guidelines  MONITOR:   Vent status, TF tolerance, Labs  REASON FOR ASSESSMENT:   Ventilator, Consult Enteral/tube feeding initiation and management  ASSESSMENT:   55 yo male admitted with acute hypoxic respiratory failure in the setting of acute on chronic CHF. PMH includes CHF, metabolic syndrome, A fib, alcohol abuse, vaping, bipolar disorder.  Discussed patient in ICU rounds and with RN today. Requiring propofol and levophed. Received MD Consult for TF initiation and management. OG tube in place.  Spoke with patient's wife at bedside. Patient has a good appetite and eats "a lot" at home.   Patient is currently intubated on ventilator support MV: 16.1 L/min Temp (24hrs), Avg:98.7 F (37.1 C), Min:97.6 F (36.4 C), Max:100.1 F (37.8 C)  Propofol: 41.1 ml/hr providing 1085 kcal from lipid  Labs reviewed.  CBG: 148-205  Medications reviewed and include novolog, solumedrol, protonix, levophed, propofol.   Weight history reviewed. No recent weight changes noted.  NUTRITION - FOCUSED PHYSICAL EXAM:  Flowsheet Row Most Recent Value  Orbital Region No depletion  Upper Arm Region No depletion  Thoracic and Lumbar Region No depletion  Buccal Region Unable to assess  Temple Region No depletion  Clavicle Bone Region No depletion  Clavicle and Acromion Bone Region No depletion  Scapular Bone Region No depletion  Dorsal Hand No depletion  Patellar Region No depletion  Anterior Thigh Region No depletion  Posterior Calf Region No  depletion  Edema (RD Assessment) None  Hair Reviewed  Eyes Unable to assess  Mouth Unable to assess  Skin Reviewed  Nails Reviewed       Diet Order:   Diet Order     None       EDUCATION NEEDS:   Not appropriate for education at this time  Skin:  Skin Assessment: Reviewed RN Assessment  Last BM:  no BM documented  Height:   Ht Readings from Last 1 Encounters:  05/09/21 5\' 8"  (1.727 m)    Weight:   Wt Readings from Last 1 Encounters:  05/09/21 (!) 172.4 kg    Ideal Body Weight:  70 kg  BMI:  Body mass index is 57.78 kg/m.  Estimated Nutritional Needs:   Kcal:  1540-1750  Protein:  140-175 gm  Fluid:  1.8-2 L    Lucas Mallow, RD, LDN, CNSC Please refer to Amion for contact information.

## 2021-05-10 NOTE — ED Notes (Signed)
Pt on 15 nrb. Md bero notified of pts coughing blood sputum

## 2021-05-10 NOTE — ED Notes (Signed)
Pt on 5 l [place]

## 2021-05-10 NOTE — ED Notes (Signed)
Rt called to evaluate pt due to increased respiration and low oxygen saturation.

## 2021-05-10 NOTE — Procedures (Signed)
Arterial Catheter Insertion Procedure Note  VICKY MCCANLESS  [id number]  [birth year]  Date:05/10/21  Time:7:09 AM    Provider Performing: [name]    Procedure: Insertion of Arterial Line [phone]) with US guidance ([id number])   Indication(s) Blood pressure monitoring and/or need for frequent ABGs  Consent Unable to obtain consent due to emergent nature of procedure.  Anesthesia None   Time Out Verified patient identification, verified procedure, site/side was marked, verified correct patient position, special equipment/implants available, medications/allergies/relevant history reviewed, required imaging and test results available.   Sterile Technique Maximal sterile technique including full sterile barrier drape, hand hygiene, sterile gown, sterile gloves, mask, hair covering, sterile ultrasound probe cover (if used).   Procedure Description Area of catheter insertion was cleaned with chlorhexidine and draped in sterile fashion. Without real-time ultrasound guidance an arterial catheter was placed into the right radial artery.  Appropriate arterial tracings confirmed on monitor.     Complications/Tolerance None; patient tolerated the procedure well.   EBL Minimal   Specimen(s) None  Aline was placed with two RT present at bedside, no complications noted. Aline is able was zeroed, able to draw back, and properly flushes at this time.

## 2021-05-10 NOTE — Progress Notes (Signed)
eLink Physician-Brief Progress Note Patient Name: [name] DOB: [birth year] MRN: [id number]   Date of Service  05/10/2021  HPI/Events of Note  ABG reviewed, patient about to have arterial line placed followed by repeat gas per respiratory therapy communication.  eICU Interventions  Ventilator adjustments will be as indicated by pending repeat ABG.        [name] 05/10/2021, 6:57 AM

## 2021-05-10 NOTE — Procedures (Signed)
Central Venous Catheter Insertion Procedure Note  BRADAN CONGROVE  [id number]  03/05/1966  Date:05/10/21  Time:5:54 AM   Provider Performing:Abril Cappiello R Redford Behrle   Procedure: Insertion of Non-tunneled Central Venous [phone]) with US guidance ([id number])   Indication(s) Difficult access  Consent Risks of the procedure as well as the alternatives and risks of each were explained to the patient and/or caregiver.  Consent for the procedure was obtained and is signed in the bedside chart  Anesthesia Topical only with 1% lidocaine   Timeout Verified patient identification, verified procedure, site/side was marked, verified correct patient position, special equipment/implants available, medications/allergies/relevant history reviewed, required imaging and test results available.  Sterile Technique Maximal sterile technique including full sterile barrier drape, hand hygiene, sterile gown, sterile gloves, mask, hair covering, sterile ultrasound probe cover (if used).  Procedure Description Area of catheter insertion was cleaned with chlorhexidine and draped in sterile fashion.  With real-time ultrasound guidance a central venous catheter was placed into the right internal jugular vein. Nonpulsatile blood flow and easy flushing noted in all ports.  The catheter was sutured in place and sterile dressing applied.  Complications/Tolerance None; patient tolerated the procedure well. Chest X-ray is ordered to verify placement for internal jugular or subclavian cannulation.   Chest x-ray is not ordered for femoral cannulation.  EBL Minimal  Specimen(s) None   Otilio Carpen Michalina Calbert, PA-C

## 2021-05-11 ENCOUNTER — Inpatient Hospital Stay (HOSPITAL_COMMUNITY): Payer: Medicare HMO

## 2021-05-11 DIAGNOSIS — J810 Acute pulmonary edema: Secondary | ICD-10-CM

## 2021-05-11 DIAGNOSIS — I428 Other cardiomyopathies: Secondary | ICD-10-CM

## 2021-05-11 LAB — CBC
HCT: 44.4 % (ref 39.0–52.0)
Hemoglobin: 15.4 g/dL (ref 13.0–17.0)
MCH: 29.2 pg (ref 26.0–34.0)
MCHC: 34.7 g/dL (ref 30.0–36.0)
MCV: 84.3 fL (ref 80.0–100.0)
Platelets: 301 10*3/uL (ref 150–400)
RBC: 5.27 MIL/uL (ref 4.22–5.81)
RDW: 13.8 % (ref 11.5–15.5)
WBC: 21.7 10*3/uL — ABNORMAL HIGH (ref 4.0–10.5)
nRBC: 0 % (ref 0.0–0.2)

## 2021-05-11 LAB — BASIC METABOLIC PANEL
Anion gap: 10 (ref 5–15)
BUN: 30 mg/dL — ABNORMAL HIGH (ref 6–20)
CO2: 22 mmol/L (ref 22–32)
Calcium: 8.7 mg/dL — ABNORMAL LOW (ref 8.9–10.3)
Chloride: 106 mmol/L (ref 98–111)
Creatinine, Ser: 1.91 mg/dL — ABNORMAL HIGH (ref 0.61–1.24)
GFR, Estimated: 42 mL/min — ABNORMAL LOW (ref >60)
Glucose, Bld: 227 mg/dL — ABNORMAL HIGH (ref 70–99)
Potassium: 3.6 mmol/L (ref 3.5–5.1)
Sodium: 138 mmol/L (ref 135–145)

## 2021-05-11 LAB — MAGNESIUM: Magnesium: 2.4 mg/dL (ref 1.7–2.4)

## 2021-05-11 LAB — GLUCOSE, CAPILLARY
Glucose-Capillary: 237 mg/dL — ABNORMAL HIGH (ref 70–99)
Glucose-Capillary: 200 mg/dL — ABNORMAL HIGH (ref 70–99)
Glucose-Capillary: 231 mg/dL — ABNORMAL HIGH (ref 70–99)
Glucose-Capillary: 213 mg/dL — ABNORMAL HIGH (ref 70–99)
Glucose-Capillary: 213 mg/dL — ABNORMAL HIGH (ref 70–99)
Glucose-Capillary: 180 mg/dL — ABNORMAL HIGH (ref 70–99)

## 2021-05-11 LAB — TRIGLYCERIDES: Triglycerides: 159 mg/dL — ABNORMAL HIGH (ref <150)

## 2021-05-11 MED ORDER — POTASSIUM CHLORIDE 20 MEQ PO PACK
40.0000 meq | PACK | Freq: Once | ORAL | Status: AC
  Administered 2021-05-11: 40 meq via ORAL
  Filled 2021-05-11: qty 2

## 2021-05-11 MED ORDER — FUROSEMIDE 10 MG/ML IJ SOLN
60.0000 mg | Freq: Four times a day (QID) | INTRAMUSCULAR | Status: AC
  Administered 2021-05-11 (×2): 60 mg via INTRAVENOUS
  Filled 2021-05-11 (×2): qty 6

## 2021-05-11 MED ORDER — RIVAROXABAN 20 MG PO TABS: 20.0000 mg | ORAL_TABLET | Freq: Every day | ORAL | Status: DC

## 2021-05-11 MED ORDER — CLONAZEPAM 1 MG PO TABS
1.0000 mg | ORAL_TABLET | Freq: Two times a day (BID) | ORAL | Status: DC
  Administered 2021-05-11 – 2021-05-12 (×3): 1 mg
  Filled 2021-05-11 (×3): qty 1

## 2021-05-11 MED ORDER — RIVAROXABAN 20 MG PO TABS
20.0000 mg | ORAL_TABLET | Freq: Every day | ORAL | Status: DC
  Administered 2021-05-11 – 2021-05-12 (×2): 20 mg
  Filled 2021-05-11 (×3): qty 1

## 2021-05-11 NOTE — Progress Notes (Signed)
Epoprostenol does weaned today and stopped at this time. Infusion pump is still hooked up inline if needed to restart. Patient's VSS at this time and no respiratory distress noted. RN is aware. RT will monitor as needed.

## 2021-05-11 NOTE — Progress Notes (Addendum)
NAME:  [name], MRN:  [id number], DOB:  [birth year], LOS: 1 ADMISSION DATE:  05/09/2021, CONSULTATION DATE:  05/10/21 REFERRING MD:  EDP, CHIEF COMPLAINT:  SOB, chest pain   History of Present Illness:  55 year old man who presented with shortness of breath. This has been refractory to bronchodilators.  In ER, received some fluids and got worse.  Placed on BIPAP but began having possible frank hemoptysis so intubated for airway protection.  CTA read as multifocal pneumonia although this was underwhelming on my read.  Currently on high vent requirements and PCCM to admit.  Pertinent  Medical History  Metabolic syndrome CHF Afib and prior VTE on AC Alcohol abuse Vaping Bipolar 1  Significant Hospital Events: Including procedures, antibiotic start and stop dates in addition to other pertinent events   6/10 intubation, CVL, admission 6/10 No acute events overnight, diuresed well with IV lasix yesterday. Vent setting much improved   Interim History / Subjective:  Deeply sedated on vent  Wife at bedside and updated   Objective   Blood pressure 134/89, pulse 72, temperature 99.7 F (37.6 C), temperature source Axillary, resp. rate (!) 30, height 5' 8"  (1.727 m), weight (!) 172.4 kg, SpO2 97 %.    Vent Mode: PRVC FiO2 (%):  [40 %-100 %] 40 % Set Rate:  [26 bmp-30 bmp] 30 bmp Vt Set:  [550 mL] 550 mL PEEP:  [10 DQQ22-97 cmH20] 10 cmH20 Plateau Pressure:  [20 cmH20-31 cmH20] 20 cmH20   Intake/Output Summary (Last 24 hours) at 05/11/2021 9892 Last data filed at 05/11/2021 0600 Gross per 24 hour  Intake 2873.54 ml  Output 1400 ml  Net 1473.54 ml   Filed Weights   05/09/21 2132  Weight: (!) 172.4 kg    Examination: General: Acute on chronically ill appearing middle aged male lying in bed on mechanical ventilation, in NAD HEENT: ETT, MM pink/moist, PERRL,  Neuro: Deeply sedated on vent  CV: s1s2 regular rate and rhythm, no murmur, rubs, or gallops,  PULM:  Clear to  ascultation, slightly diminished bilaterally, no added breath sounds, no increased work of breathing  GI: soft, bowel sounds active in all 4 quadrants, non-tender, non-distended Extremities: warm/dry, non pitting edema  Skin: no rashes or lesion  Labs/imaging that I havepersonally reviewed   6/10 CTA chest No PE Multifocal pneumonia. COVID-19 infection not excluded. Likely reactive right hilar and mediastinal lymph node. Cardiomegaly 6/10 ECHO EF less than 20%, with global hypokinesis,   Resolved Hospital Problem list   N/a  Assessment & Plan:  Acute hypoxemic respiratory failure Hemoptysis and peri-bronchovascular ground lass opacities -In setting of trying vaping at home.  Differential includes pulmonary edema w/ hemorrhage vs. Inhalational reaction to vaping vs. CAP.   Severe OSA P: Continue ventilator support with lung protective strategies  Wean PEEP and FiO2 for sats greater than 90%, both improved compared to admission  Head of bed elevated 30 degrees. Plateau pressures less than 30 cm H20 Follow intermittent chest x-ray and ABG SAT/SBT as tolerated, mentation preclude extubation  Ensure adequate pulmonary hygiene  Follow cultures  VAP bundle in place  PAD protocol AC remains on hold  Continue broad spectrum antibiotics  IV Steroids Continue BDs Veletri can likely be weaned off today  RASS goal 0 to -1  Chronic NICM -Near his dry weight, follow by heart failure clinic EF on ECHO 02/22 EF 20-25% with global hypokinesis  P: Strict intake and output Continuous telemetry Repeat echo with decreased EF of less  than 20% Consider cardiology consult Close monitoring of hemodynamics Optimize electrolytes Diurese with IV lasix   Acute Kidney Injury - improving  -Creatinine 0.99 3/2, creatinine on admission 1.42 with up trended to 2.27 P: Follow renal function Monitor urine output Trend Bmet Avoid nephrotoxins Ensure adequate renal perfusion  Good urine output of 1.4  L 6/10  Hx of Afib/VTE  -Chronically anticoagulated on Xarelto Hx of HTN/HLD,   -Home medications include metoprolol, Lipitor, Cozaar, sh, and Demadex P: Home Xarelto remains on hold Continuous telemetry  Resume home medications as able   DM2 with hyperglycemia -Home medications include Farxiga P: Hold home medications acutely Continue SSI CBG checks every 4 hours  History of anxiety and bipolar -Home medications include Abilify, Klonopin, and Zoloft P: Home Klonopin continued inpatient  Best practice   Diet:  NPO Pain/Anxiety/Delirium protocol (if indicated): No VAP protocol (if indicated): Yes DVT prophylaxis: Contraindicated GI prophylaxis: PPI Glucose control:  SSI No Central venous access:  Yes, and it is still needed Arterial line:  Yes, and it is still needed Foley:  Yes, and it is still needed Mobility:  bed rest  PT consulted: N/A Last date of multidisciplinary goals of care discussion: Wife updated at bedside 6/11 Code Status:  full code Disposition: ICU   Critical care time:    Performed by: Johnsie Cancel  Total critical care time: 39 minutes  Critical care time was exclusive of separately billable procedures and treating other patients.  Critical care was necessary to treat or prevent imminent or life-threatening deterioration.  Critical care was time spent personally by me on the following activities: development of treatment plan with patient and/or surrogate as well as nursing, discussions with consultants, evaluation of patient's response to treatment, examination of patient, obtaining history from patient or surrogate, ordering and performing treatments and interventions, ordering and review of laboratory studies, ordering and review of radiographic studies, pulse oximetry and re-evaluation of patient's condition.  Johnsie Cancel, NP-C  [facility] Personal contact information can be found on Amion  05/11/2021, 8:18  AM   PCCM attending:  This is a 55 year old gentleman, past medical history of nonischemic cardiomyopathy, ejection fraction less than 20%, severe OSA, severe morbid obesity BMI 57, type 2 diabetes, atrial fibrillation.  Patient presented to the hospital with hypoxemic respiratory failure and evidence of hemoptysis CT scan with bilateral groundglass opacities concerning for pulmonary edema.  I have reviewed patient's CT imaging today.  He also vapes.  BP 117/74   Pulse 65   Temp 99.4 F (37.4 C) (Axillary)   Resp (!) 30   Ht 5' 8"  (1.727 m)   Wt (!) 172.4 kg   SpO2 96%   BMI 57.78 kg/m   General: Obese middle-age male intubated on mechanical life support critically ill HEENT: Endotracheal tube in place Heart: Regular rhythm S1-S2, distant heart tones Lungs: Bilateral mechanically ventilated breath sounds Abdomen: Morbidly obese pannus Extremities: Dependent edema  Labs: Reviewed  Assessment: Acute hypoxemic respiratory failure Acute bilateral pulmonary edema, history of vaping?,  Also had some hemoptysis which I believe is related to his acute bilateral pulmonary edema on anticoagulation.  Acute on chronic systolic heart failure, nonischemic cardiomyopathy AKI A. fib, VTe on AC Severe OSA Type 2 diabetes with hyperglycemia  Plan: Patient needs continued diuresis Follow I's and O's Follow kidney function Wean PEEP and FiO2 as tolerated to maintain sats above 88%. Patient needs significant lifestyle counseling.  I met with patient's daughter and wife at  bedside.  He needs to do much better job of taking care of himself he does not routinely take his heart failure medication at home like he supposed to.  He continues to vape and eat and drink what he wants.  He also has severe obstructive sleep apnea and is not always compliant with his CPAP machine.  We will work to [employer], would try to stop the [employer].  I do not think he needs this anymore.  His oxygenation is fine  he is on 40% FiO2.  Met with patient's wife and daughter at bedside to discuss each of these issues above.  They are understanding of the plan peer we will work to try to liberate from mechanical support as soon as possible.  This patient is critically ill with multiple organ system failure; which, requires frequent high complexity decision making, assessment, support, evaluation, and titration of therapies. This was completed through the application of advanced monitoring technologies and extensive interpretation of multiple databases. During this encounter critical care time was devoted to patient care services described in this note for 42 minutes.  Garner Nash, DO [facility] 05/11/2021 11:45 AM

## 2021-05-12 DIAGNOSIS — I5042 Chronic combined systolic (congestive) and diastolic (congestive) heart failure: Secondary | ICD-10-CM

## 2021-05-12 DIAGNOSIS — I5023 Acute on chronic systolic (congestive) heart failure: Secondary | ICD-10-CM

## 2021-05-12 LAB — BASIC METABOLIC PANEL
Anion gap: 9 (ref 5–15)
Anion gap: 8 (ref 5–15)
BUN: 37 mg/dL — ABNORMAL HIGH (ref 6–20)
BUN: 40 mg/dL — ABNORMAL HIGH (ref 6–20)
CO2: 24 mmol/L (ref 22–32)
CO2: 28 mmol/L (ref 22–32)
Calcium: 8.9 mg/dL (ref 8.9–10.3)
Calcium: 9.2 mg/dL (ref 8.9–10.3)
Chloride: 111 mmol/L (ref 98–111)
Chloride: 109 mmol/L (ref 98–111)
Creatinine, Ser: 1.45 mg/dL — ABNORMAL HIGH (ref 0.61–1.24)
Creatinine, Ser: 1.58 mg/dL — ABNORMAL HIGH (ref 0.61–1.24)
GFR, Estimated: 58 mL/min — ABNORMAL LOW (ref >60)
GFR, Estimated: 52 mL/min — ABNORMAL LOW (ref >60)
Glucose, Bld: 163 mg/dL — ABNORMAL HIGH (ref 70–99)
Glucose, Bld: 162 mg/dL — ABNORMAL HIGH (ref 70–99)
Potassium: 3.7 mmol/L (ref 3.5–5.1)
Potassium: 4.5 mmol/L (ref 3.5–5.1)
Sodium: 144 mmol/L (ref 135–145)
Sodium: 145 mmol/L (ref 135–145)

## 2021-05-12 LAB — GLUCOSE, CAPILLARY
Glucose-Capillary: 101 mg/dL — ABNORMAL HIGH (ref 70–99)
Glucose-Capillary: 126 mg/dL — ABNORMAL HIGH (ref 70–99)
Glucose-Capillary: 127 mg/dL — ABNORMAL HIGH (ref 70–99)
Glucose-Capillary: 137 mg/dL — ABNORMAL HIGH (ref 70–99)
Glucose-Capillary: 143 mg/dL — ABNORMAL HIGH (ref 70–99)
Glucose-Capillary: 144 mg/dL — ABNORMAL HIGH (ref 70–99)

## 2021-05-12 LAB — CBC
HCT: 39.9 % (ref 39.0–52.0)
Hemoglobin: 14.5 g/dL (ref 13.0–17.0)
MCH: 30 pg (ref 26.0–34.0)
MCHC: 36.3 g/dL — ABNORMAL HIGH (ref 30.0–36.0)
MCV: 82.4 fL (ref 80.0–100.0)
Platelets: 264 10*3/uL (ref 150–400)
RBC: 4.84 MIL/uL (ref 4.22–5.81)
RDW: 13.6 % (ref 11.5–15.5)
WBC: 19.7 10*3/uL — ABNORMAL HIGH (ref 4.0–10.5)
nRBC: 0 % (ref 0.0–0.2)

## 2021-05-12 LAB — MAGNESIUM
Magnesium: 2.2 mg/dL (ref 1.7–2.4)
Magnesium: 2.1 mg/dL (ref 1.7–2.4)

## 2021-05-12 MED ORDER — POTASSIUM CHLORIDE 20 MEQ PO PACK: 40.0000 meq | PACK | Freq: Once | ORAL | Status: DC

## 2021-05-12 MED ORDER — FUROSEMIDE 10 MG/ML IJ SOLN
60.0000 mg | Freq: Two times a day (BID) | INTRAMUSCULAR | Status: DC
  Administered 2021-05-12: 60 mg via INTRAVENOUS
  Filled 2021-05-12: qty 6

## 2021-05-12 MED ORDER — POTASSIUM CHLORIDE 20 MEQ PO PACK
40.0000 meq | PACK | Freq: Once | ORAL | Status: AC
  Administered 2021-05-12: 40 meq
  Filled 2021-05-12: qty 2

## 2021-05-12 MED ORDER — PANTOPRAZOLE SODIUM 40 MG PO PACK
40.0000 mg | PACK | Freq: Every day | ORAL | Status: DC
  Administered 2021-05-12: 40 mg
  Filled 2021-05-12 (×3): qty 20

## 2021-05-12 MED ORDER — POTASSIUM CHLORIDE 20 MEQ PO PACK
40.0000 meq | PACK | Freq: Every day | ORAL | Status: DC
  Administered 2021-05-12: 40 meq via ORAL
  Filled 2021-05-12: qty 2

## 2021-05-12 MED ORDER — METOLAZONE 2.5 MG PO TABS
5.0000 mg | ORAL_TABLET | Freq: Once | ORAL | Status: AC
  Administered 2021-05-12: 5 mg via ORAL
  Filled 2021-05-12: qty 2

## 2021-05-12 MED ORDER — DOCUSATE SODIUM 50 MG/5ML PO LIQD: 100.0000 mg | Freq: Two times a day (BID) | ORAL | Status: DC | PRN

## 2021-05-12 MED ORDER — DEXMEDETOMIDINE HCL IN NACL 400 MCG/100ML IV SOLN
0.4000 ug/kg/h | INTRAVENOUS | Status: DC
  Administered 2021-05-12 (×2): 0.4 ug/kg/h via INTRAVENOUS
  Administered 2021-05-12 – 2021-05-13 (×2): 0.5 ug/kg/h via INTRAVENOUS
  Administered 2021-05-13: 0.2 ug/kg/h via INTRAVENOUS
  Filled 2021-05-12 (×5): qty 100

## 2021-05-12 MED ORDER — ACETAMINOPHEN 325 MG PO TABS: 650.0000 mg | ORAL_TABLET | Freq: Four times a day (QID) | ORAL | Status: DC | PRN

## 2021-05-12 MED ORDER — POTASSIUM CHLORIDE 20 MEQ PO PACK: 40.0000 meq | PACK | Freq: Every day | ORAL | Status: DC

## 2021-05-12 MED ORDER — FUROSEMIDE 10 MG/ML IJ SOLN
60.0000 mg | Freq: Three times a day (TID) | INTRAMUSCULAR | Status: DC
  Administered 2021-05-12 – 2021-05-13 (×3): 60 mg via INTRAVENOUS
  Filled 2021-05-12 (×3): qty 6

## 2021-05-12 NOTE — Progress Notes (Addendum)
NAME:  [name], MRN:  [id number], DOB:  05-28-1966, LOS: 2 ADMISSION DATE:  05/09/2021, CONSULTATION DATE:  05/10/21 REFERRING MD:  EDP, CHIEF COMPLAINT:  SOB, chest pain   History of Present Illness:  55 year old man who presented with shortness of breath. This has been refractory to bronchodilators.  In ER, received some fluids and got worse.  Placed on BIPAP but began having possible frank hemoptysis so intubated for airway protection.  CTA read as multifocal pneumonia although this was underwhelming on my read.  Currently on high vent requirements and PCCM to admit.  Pertinent  Medical History  Metabolic syndrome CHF Afib and prior VTE on AC Alcohol abuse Vaping Bipolar 1  Significant Hospital Events:  6/10 intubation, CVL, admission. Initial concern for lung injury related to vaping but patient significantly improved with aggressive diuresing which indication SOB, cough, and hemoptysis was likely secondary to severe volume overload in the setting of severe CHF with chronic anticoagulation  6/10 No acute events overnight, diuresed well with IV lasix yesterday. Vent setting much improved  6/11 Continue to diurese well with 2L removed yesterday. Pressor and sedation requirements also decreased   Interim History / Subjective:  No acute events overnight  Wife again at bedside and updated   Objective   Blood pressure 98/69, pulse (!) 54, temperature 99.4 F (37.4 C), temperature source Axillary, resp. rate 15, height 5\' 8"  (1.727 m), weight (!) 172.6 kg, SpO2 100 %.    Vent Mode: PRVC FiO2 (%):  [40 %] 40 % Set Rate:  [30 bmp] 30 bmp Vt Set:  [550 mL] 550 mL PEEP:  [8 cmH20-10 cmH20] 8 cmH20 Plateau Pressure:  [20 cmH20-22 cmH20] 21 cmH20   Intake/Output Summary (Last 24 hours) at 05/12/2021 0759 Last data filed at 05/12/2021 0600 Gross per 24 hour  Intake 2690.76 ml  Output 3310 ml  Net -619.24 ml    Filed Weights   05/09/21 2132 05/12/21 0315  Weight: (!) 172.4 kg  (!) 172.6 kg    Examination: General: Acute on chronically ill appearing middle aged male lying in bed on mechanical ventilation, in NAD HEENT: ETT, MM pink/moist, PERRL,  Neuro: Will open eyes and follow simple commands when sedation lightened CV: s1s2 regular rate and rhythm, no murmur, rubs, or gallops,  PULM:  Clear to ascultation bilaterally, no increased work of breathing no added breath sounds  GI: soft, bowel sounds active in all 4 quadrants, non-tender, non-distended Extremities: warm/dry, non pitting edema  Skin: no rashes or lesions  Labs/imaging that I havepersonally reviewed   6/10 CTA chest No PE Multifocal pneumonia. COVID-19 infection not excluded. Likely reactive right hilar and mediastinal lymph node. Cardiomegaly 6/10 ECHO EF less than 20%, with global hypokinesis,   Resolved Hospital Problem list   N/a  Assessment & Plan:  Acute hypoxemic respiratory failure Hemoptysis and peri-bronchovascular ground lass opacities -Initial concern for lung injury related to vaping but patient significantly improved with aggressive diuresing which indication SOB, cough, and hemoptysis was likely secondary to severe volume overload in the setting of severe CHF with chronic anticoagulation  Severe OSA P: Lighten sedation to facilitate vent wean  Continue ventilator support with lung protective strategies  Wean PEEP and FiO2 for sats greater than 90%. Head of bed elevated 30 degrees. Plateau pressures less than 30 cm H20.  Follow intermittent chest x-ray and ABG.   Ensure adequate pulmonary hygiene  Follow cultures  VAP bundle in place  PAD protocol Empiric antibiotic stopped  6/10 Steroid stopped 6/10 Continue BDs Veletri weaned off overnight 6/10  Chronic NICM -Near his dry weight, follow by heart failure clinic EF on ECHO 02/22 EF 20-25% with global hypokinesis  P: Strict intake and output Continuous telemetry  ECHO as above Close monitoring of hemodynamics Continue  aggressive IV diuretics  Acute Kidney Injury - improving  -Creatinine 0.99 3/2, creatinine on admission 1.42 with up trended to 2.27 P: Follow renal function  Monitor urine output Trend Bmet Avoid nephrotoxins Ensure adequate renal perfusion   Hx of Afib/VTE  -Chronically anticoagulated on Xarelto Hx of HTN/HLD,   -Home medications include metoprolol, Lipitor, Cozaar, sh, and Demadex P: Home Xarelto resumed 6/10 Continuous telemetry  Resume home medications as able   DM2 with hyperglycemia -Home medications include Farxiga P: Home medications remain on hold  Continue SSI CBG checks q 4 hrs CBG goal 140-180  History of anxiety and bipolar -Home medications include Abilify, Klonopin, and Zoloft P: Continue home Klonopin   Best practice   Diet:  Tube Feed  Pain/Anxiety/Delirium protocol (if indicated): No VAP protocol (if indicated): Yes DVT prophylaxis: Home Xarelto GI prophylaxis: PPI Glucose control:  SSI No Central venous access:  Yes, and it is still needed Arterial line:  Yes, and it is still needed Foley:  Yes, and it is still needed Mobility:  bed rest  PT consulted: N/A Last date of multidisciplinary goals of care discussion: Wife updated at bedside 6/11 Code Status:  full code Disposition: ICU   Critical care time:    Performed by: Johnsie Cancel  Total critical care time: 37 minutes  Critical care time was exclusive of separately billable procedures and treating other patients.  Critical care was necessary to treat or prevent imminent or life-threatening deterioration.  Critical care was time spent personally by me on the following activities: development of treatment plan with patient and/or surrogate as well as nursing, discussions with consultants, evaluation of patient's response to treatment, examination of patient, obtaining history from patient or surrogate, ordering and performing treatments and interventions, ordering and review of  laboratory studies, ordering and review of radiographic studies, pulse oximetry and re-evaluation of patient's condition.  Johnsie Cancel, NP-C [facility] Personal contact information can be found on Amion  05/12/2021, 7:59 AM   Pulmonary critical care attending:  This is a 55 year old gentleman, past medical history of nonischemic cardiomyopathy, ejection fraction less than 20%, severe OSA, severe morbid obesity, BMI 57, type 2 diabetes and atrial fibrillation.  Patient presents to the hospital with hypoxemic respiratory failure, frothy sputum hemoptysis and bilateral groundglass opacities concerning for acute pulmonary edema in addition patient also vapes and has not been noncompliant with his heart failure regimen at home per his wife.  Patient remains critically ill intubated on mechanical life support.  FiO2 has come down and mechanical support on ventilator has come down.  We are starting to decrease his sedation.  He is more awake and following commands.  BP 107/73   Pulse 74   Temp 98.9 F (37.2 C) (Axillary)   Resp (!) 24   Ht 5\' 8"  (1.727 m)   Wt (!) 172.6 kg   SpO2 99%   BMI 57.86 kg/m   General: Obese middle-age male intubated mechanical life support critically ill HEENT: Endotracheal tube in place, large neck Heart: Regular rhythm, S1-S2 distant heart tones Lungs: Bilateral mechanically ventilated breath sounds no crackles Abdomen: Morbidly obese pannus, soft mildly distended Extremities: Dependent edema   Intake/Output  Summary (Last 24 hours) at 05/12/2021 1030 Last data filed at 05/12/2021 0800 Gross per 24 hour  Intake 2171.06 ml  Output 2460 ml  Net -288.94 ml    Assessment: Acute hypoxemic respiratory failure Bilateral pulmonary edema, acute systolic diastolic heart failure, history of nonischemic cardiomyopathy History of vaping, question if this induced some lung injury on presentation. Noncompliance with home heart failure  regimen AKI, improving A. fib, history of VTE on anticoagulation Severe OSA Severe morbid obesity BMI 57 Type 2 diabetes hyperglycemia  Plan: Continued IV diuresis Increase Lasix to 60 every 8 hours Dose of metolazone today x1 Follow I's and O's Follow kidney function Wean PEEP and FiO2 to maintain sats above 88%. He is not quite ready to come off ventilator at this time. Try to come off of fentanyl Go to Precedex alone Xarelto per tube Check electrolytes and magnesium with increase of diuresis today sometime this afternoon  ?  Hopeful for extubation tomorrow morning 05/13/2021  Updated patient's wife at bedside.  This patient is critically ill with multiple organ system failure; which, requires frequent high complexity decision making, assessment, support, evaluation, and titration of therapies. This was completed through the application of advanced monitoring technologies and extensive interpretation of multiple databases. During this encounter critical care time was devoted to patient care services described in this note for 40 minutes.  Garner Nash, DO [facility] 05/12/2021 10:39 AM

## 2021-05-12 NOTE — Progress Notes (Signed)
K 3.7 Electrolytes replaced per [facility] electrolyte replacement protocol

## 2021-05-13 ENCOUNTER — Inpatient Hospital Stay (HOSPITAL_COMMUNITY): Payer: Medicare HMO

## 2021-05-13 ENCOUNTER — Other Ambulatory Visit (HOSPITAL_COMMUNITY): Payer: Medicare HMO

## 2021-05-13 DIAGNOSIS — I509 Heart failure, unspecified: Secondary | ICD-10-CM

## 2021-05-13 DIAGNOSIS — I4891 Unspecified atrial fibrillation: Secondary | ICD-10-CM

## 2021-05-13 LAB — BASIC METABOLIC PANEL
Anion gap: 12 (ref 5–15)
Anion gap: 11 (ref 5–15)
BUN: 51 mg/dL — ABNORMAL HIGH (ref 6–20)
BUN: 53 mg/dL — ABNORMAL HIGH (ref 6–20)
CO2: 26 mmol/L (ref 22–32)
CO2: 31 mmol/L (ref 22–32)
Calcium: 9.1 mg/dL (ref 8.9–10.3)
Calcium: 9.3 mg/dL (ref 8.9–10.3)
Chloride: 105 mmol/L (ref 98–111)
Chloride: 102 mmol/L (ref 98–111)
Creatinine, Ser: 1.76 mg/dL — ABNORMAL HIGH (ref 0.61–1.24)
Creatinine, Ser: 1.66 mg/dL — ABNORMAL HIGH (ref 0.61–1.24)
GFR, Estimated: 46 mL/min — ABNORMAL LOW (ref >60)
GFR, Estimated: 49 mL/min — ABNORMAL LOW (ref >60)
Glucose, Bld: 168 mg/dL — ABNORMAL HIGH (ref 70–99)
Glucose, Bld: 122 mg/dL — ABNORMAL HIGH (ref 70–99)
Potassium: 3.6 mmol/L (ref 3.5–5.1)
Potassium: 3.5 mmol/L (ref 3.5–5.1)
Sodium: 143 mmol/L (ref 135–145)
Sodium: 144 mmol/L (ref 135–145)

## 2021-05-13 LAB — GLUCOSE, CAPILLARY
Glucose-Capillary: 182 mg/dL — ABNORMAL HIGH (ref 70–99)
Glucose-Capillary: 183 mg/dL — ABNORMAL HIGH (ref 70–99)
Glucose-Capillary: 140 mg/dL — ABNORMAL HIGH (ref 70–99)
Glucose-Capillary: 142 mg/dL — ABNORMAL HIGH (ref 70–99)
Glucose-Capillary: 148 mg/dL — ABNORMAL HIGH (ref 70–99)

## 2021-05-13 LAB — CBC
HCT: 44.5 % (ref 39.0–52.0)
Hemoglobin: 15.7 g/dL (ref 13.0–17.0)
MCH: 29.4 pg (ref 26.0–34.0)
MCHC: 35.3 g/dL (ref 30.0–36.0)
MCV: 83.3 fL (ref 80.0–100.0)
Platelets: 246 10*3/uL (ref 150–400)
RBC: 5.34 MIL/uL (ref 4.22–5.81)
RDW: 13.8 % (ref 11.5–15.5)
WBC: 12.6 10*3/uL — ABNORMAL HIGH (ref 4.0–10.5)
nRBC: 0 % (ref 0.0–0.2)

## 2021-05-13 LAB — MAGNESIUM: Magnesium: 2.2 mg/dL (ref 1.7–2.4)

## 2021-05-13 MED ORDER — AMIODARONE LOAD VIA INFUSION
150.0000 mg | Freq: Once | INTRAVENOUS | Status: AC
  Administered 2021-05-13: 150 mg via INTRAVENOUS
  Filled 2021-05-13: qty 83.34

## 2021-05-13 MED ORDER — GABAPENTIN 100 MG PO CAPS
100.0000 mg | ORAL_CAPSULE | Freq: Three times a day (TID) | ORAL | Status: DC
  Filled 2021-05-13: qty 1

## 2021-05-13 MED ORDER — AMIODARONE HCL IN DEXTROSE 360-4.14 MG/200ML-% IV SOLN
30.0000 mg/h | INTRAVENOUS | Status: DC
  Administered 2021-05-14: 30 mg/h via INTRAVENOUS

## 2021-05-13 MED ORDER — METOPROLOL TARTRATE 5 MG/5ML IV SOLN
INTRAVENOUS | Status: AC
  Filled 2021-05-13: qty 5

## 2021-05-13 MED ORDER — SERTRALINE HCL 50 MG PO TABS
100.0000 mg | ORAL_TABLET | Freq: Every day | ORAL | Status: DC
  Filled 2021-05-13: qty 2

## 2021-05-13 MED ORDER — METOPROLOL TARTRATE 5 MG/5ML IV SOLN
5.0000 mg | INTRAVENOUS | Status: AC | PRN
  Administered 2021-05-13 (×3): 5 mg via INTRAVENOUS
  Filled 2021-05-13 (×2): qty 5

## 2021-05-13 MED ORDER — ACETAMINOPHEN 160 MG/5ML PO SOLN: 650.0000 mg | ORAL | Status: DC | PRN

## 2021-05-13 MED ORDER — ACETAMINOPHEN 160 MG/5ML PO SOLN
650.0000 mg | Freq: Four times a day (QID) | ORAL | Status: DC | PRN
  Administered 2021-05-13: 650 mg
  Filled 2021-05-13: qty 20.3

## 2021-05-13 MED ORDER — ONDANSETRON HCL 4 MG/2ML IJ SOLN
4.0000 mg | Freq: Four times a day (QID) | INTRAMUSCULAR | Status: DC | PRN
  Administered 2021-05-13: 4 mg via INTRAVENOUS
  Filled 2021-05-13: qty 2

## 2021-05-13 MED ORDER — POTASSIUM CHLORIDE 10 MEQ/50ML IV SOLN
10.0000 meq | INTRAVENOUS | Status: AC
  Administered 2021-05-13 (×4): 10 meq via INTRAVENOUS
  Filled 2021-05-13 (×4): qty 50

## 2021-05-13 MED ORDER — CLONAZEPAM 1 MG PO TABS: 1.0000 mg | ORAL_TABLET | Freq: Two times a day (BID) | ORAL | Status: DC | PRN

## 2021-05-13 MED ORDER — AMIODARONE HCL IN DEXTROSE 360-4.14 MG/200ML-% IV SOLN
60.0000 mg/h | INTRAVENOUS | Status: DC
  Administered 2021-05-13: 60 mg/h via INTRAVENOUS
  Filled 2021-05-13 (×3): qty 200

## 2021-05-13 MED ORDER — CHLORHEXIDINE GLUCONATE 0.12 % MT SOLN
OROMUCOSAL | Status: AC
  Filled 2021-05-13: qty 15

## 2021-05-13 MED ORDER — DILTIAZEM HCL 25 MG/5ML IV SOLN
10.0000 mg | Freq: Once | INTRAVENOUS | Status: AC
  Administered 2021-05-13: 10 mg via INTRAVENOUS
  Filled 2021-05-13: qty 5

## 2021-05-13 MED ORDER — OXYCODONE-ACETAMINOPHEN 5-325 MG PO TABS
1.0000 | ORAL_TABLET | ORAL | Status: DC | PRN
  Filled 2021-05-13: qty 1

## 2021-05-13 MED ORDER — FENTANYL CITRATE (PF) 100 MCG/2ML IJ SOLN
50.0000 ug | INTRAMUSCULAR | Status: DC | PRN
  Administered 2021-05-13: 50 ug via INTRAVENOUS
  Administered 2021-05-13: 25 ug via INTRAVENOUS
  Filled 2021-05-13 (×2): qty 2

## 2021-05-13 NOTE — Progress Notes (Signed)
NAME:  [name], MRN:  [id number], DOB:  Mar 30, 1966, LOS: 3 ADMISSION DATE:  05/09/2021, CONSULTATION DATE:  05/10/21 REFERRING MD:  EDP, CHIEF COMPLAINT:  SOB, chest pain   History of Present Illness:  55 year old man who presented with shortness of breath. This has been refractory to bronchodilators.  In ER, received some fluids and got worse.  Placed on BIPAP but began having possible frank hemoptysis so intubated for airway protection.  CTA read as multifocal pneumonia although this was underwhelming on my read.  Currently on high vent requirements and PCCM to admit.  Pertinent  Medical History  Metabolic syndrome CHF Afib and prior VTE on AC Alcohol abuse Vaping Bipolar 1  Significant Hospital Events:  6/10 intubation, CVL, admission. Initial concern for lung injury related to vaping but patient significantly improved with aggressive diuresing which indication SOB, cough, and hemoptysis was likely secondary to severe volume overload in the setting of severe CHF with chronic anticoagulation  6/10 No acute events overnight, diuresed well with IV lasix yesterday. Vent setting much improved  6/11 Continue to diurese well with 2L removed yesterday. Pressor and sedation requirements also decreased   Interim History / Subjective:  Continues to improve. Lightening sedation. Febrile question precedex.  Objective   Blood pressure 117/77, pulse 68, temperature (!) 102 F (38.9 C), temperature source Axillary, resp. rate (!) 30, height 5\' 8"  (1.727 m), weight (!) 167.8 kg, SpO2 98 %.    Vent Mode: PRVC FiO2 (%):  [40 %] 40 % Set Rate:  [30 bmp] 30 bmp Vt Set:  [550 mL] 550 mL PEEP:  [5 cmH20-8 cmH20] 5 cmH20 Pressure Support:  [12 cmH20] 12 cmH20 Plateau Pressure:  [17 cmH20-21 cmH20] 18 cmH20   Intake/Output Summary (Last 24 hours) at 05/13/2021 0706 Last data filed at 05/13/2021 0600 Gross per 24 hour  Intake 1602.34 ml  Output 7375 ml  Net -5772.66 ml    Filed Weights    05/09/21 2132 05/12/21 0315 05/13/21 0500  Weight: (!) 172.4 kg (!) 172.6 kg (!) 167.8 kg    Examination: Constitutional: no acute distress heavily sedated on vent  Eyes: pupils small, equal, not tracking yet Ears, nose, mouth, and throat: ETT in place, minimal secretions Cardiovascular: RRR, ext warm Respiratory: clear, no wheezing, triggers vent Gastrointestinal: soft, +BS Skin: No rashes, normal turgor Neurologic: moves all 4 ext to command with enough prompting Psychiatric: RASS -1 BUN/Cr up K down: repleting  Labs/imaging that I havepersonally reviewed   6/10 CTA chest No PE Multifocal pneumonia. COVID-19 infection not excluded. Likely reactive right hilar and mediastinal lymph node. Cardiomegaly 6/10 ECHO EF less than 20%, with global hypokinesis,   Resolved Hospital Problem list   N/a  Assessment & Plan:  Acute hypoxemic respiratory failure Hemoptysis and peri-bronchovascular ground lass opacities- improvement with PEEP/ diuresis argues this was fluid overload Severe OSA Chronic NICM Acute Kidney Injury - stabilized  Hx of Afib/VTE- back on xarelto Hx of HTN/HLD,   DM2 with hyperglycemia- minimal SSI need History of anxiety and bipolar  - Wean to extubate, resume home psych meds - Diuretic holiday - BIPAP qHS and PRN - PT/OT evaluation - Needs to stop vapoing  Best practice   Diet:  on hold for extubation Pain/Anxiety/Delirium protocol (if indicated): No VAP protocol (if indicated): Yes DVT prophylaxis: Home Xarelto GI prophylaxis: PPI Glucose control:  SSI No Central venous access:  Yes, and it is still needed Arterial line:  Yes, and it is still needed  Foley:  Yes, and it is still needed Mobility:  bed rest  PT consulted: N/A Last date of multidisciplinary goals of care discussion: Wife updated at bedside 6/13 Code Status:  full code Disposition: ICU  34 minutes cc time Erskine Emery MD PCCM

## 2021-05-13 NOTE — Progress Notes (Addendum)
eLink Physician-Brief Progress Note Patient Name: TERION HEDMAN DOB: Sep 05, 1966 MRN: [id number]   Date of Service  05/13/2021  HPI/Events of Note  Afib with RVR to 150-160 bpm. BP 100/81 (MAP 88).  Given metoprolol 5mg  IV push x3 with only minor improvement in HR to 140s. BP stable post-administration.  Of note, patient does appear somewhat hypercapnic: he wakes and is conversant and oriented but is somnolent and falls asleep rapidly.   eICU Interventions  Diltiazem 10mg  IV x1 ordered for HR.  Ordered patient to be placed on BiPAP (he is both overloaded and likely slightly hypercapnic as well).   ADDENDUM 05/13/21 11:29 PM - Patient is now on BiPAP. - Still in Afib with RVR to 140s after 3x metoprolol 5mg  IV boluses and 1x diltiazem 10mg  IV bolus (the latter of which dropped his BP to as low as 73/56, recovering to SBP of 100s over about 10 mins). - Plan: Obtain VBG in 1 hour from central line. Start amiodarone (bolus + drip) for Afib with RVR. Check BMP.  Intervention Category Intermediate Interventions: Arrhythmia - evaluation and management  Charlott Rakes 05/13/2021, 10:17 PM

## 2021-05-13 NOTE — H&P (Signed)
See consult note 6/10

## 2021-05-13 NOTE — Procedures (Signed)
Extubation Procedure Note  Patient Details:   Name: WITT PLITT DOB: 31-Jan-1966 MRN: [id number]   Airway Documentation:    Vent end date: 05/13/21 Vent end time: 0905   Evaluation  O2 sats: stable throughout Complications: No apparent complications Patient did tolerate procedure well. Bilateral Breath Sounds: Clear, Diminished   Yes 4l/min Dinosaur Incentive spirometer  Revonda Standard 05/13/2021, 9:05 AM

## 2021-05-13 NOTE — Progress Notes (Signed)
C/o acute on chronic left hip pain Has some entrapment symptoms Suspect either disc issue or OA of L hip Graded pain meds as ordered Check nonurgent hip x-ray and LLE DVT Will need OP w/u

## 2021-05-13 NOTE — Progress Notes (Signed)
SLP Cancellation Note  Patient Details Name: [name] MRN: [id number] DOB: [birth year]   Cancelled treatment:       Reason Eval/Treat Not Completed: Other (comment). RN planning to complete [facility] swallow screen during shift. Will f/u tomorrow with SLP assessment if pt is still NPO because of a failed screen. Otherwise will sign off.    Kyeisha Janowicz, Katherene Ponto 05/13/2021, 1:36 PM

## 2021-05-14 ENCOUNTER — Inpatient Hospital Stay (HOSPITAL_COMMUNITY): Payer: Medicare HMO | Admitting: Registered Nurse

## 2021-05-14 ENCOUNTER — Inpatient Hospital Stay (HOSPITAL_COMMUNITY): Payer: Medicare HMO

## 2021-05-14 ENCOUNTER — Other Ambulatory Visit (HOSPITAL_COMMUNITY): Payer: Medicare HMO

## 2021-05-14 DIAGNOSIS — I472 Ventricular tachycardia: Secondary | ICD-10-CM | POA: Diagnosis not present

## 2021-05-14 DIAGNOSIS — I509 Heart failure, unspecified: Secondary | ICD-10-CM

## 2021-05-14 DIAGNOSIS — I462 Cardiac arrest due to underlying cardiac condition: Secondary | ICD-10-CM

## 2021-05-14 DIAGNOSIS — I4901 Ventricular fibrillation: Secondary | ICD-10-CM

## 2021-05-14 DIAGNOSIS — I5023 Acute on chronic systolic (congestive) heart failure: Secondary | ICD-10-CM | POA: Diagnosis not present

## 2021-05-14 DIAGNOSIS — I480 Paroxysmal atrial fibrillation: Secondary | ICD-10-CM | POA: Diagnosis not present

## 2021-05-14 LAB — TROPONIN I (HIGH SENSITIVITY)
Troponin I (High Sensitivity): 170 ng/L (ref <18)
Troponin I (High Sensitivity): 198 ng/L (ref <18)

## 2021-05-14 LAB — APTT
aPTT: 28 seconds (ref 24–36)
aPTT: 33 seconds (ref 24–36)

## 2021-05-14 LAB — CBC WITH DIFFERENTIAL/PLATELET
Abs Immature Granulocytes: 0.05 10*3/uL (ref 0.00–0.07)
Basophils Absolute: 0 10*3/uL (ref 0.0–0.1)
Basophils Relative: 0 %
Eosinophils Absolute: 0 10*3/uL (ref 0.0–0.5)
Eosinophils Relative: 0 %
HCT: 47.7 % (ref 39.0–52.0)
Hemoglobin: 16.6 g/dL (ref 13.0–17.0)
Immature Granulocytes: 0 %
Lymphocytes Relative: 32 %
Lymphs Abs: 4.4 10*3/uL — ABNORMAL HIGH (ref 0.7–4.0)
MCH: 29.8 pg (ref 26.0–34.0)
MCHC: 34.8 g/dL (ref 30.0–36.0)
MCV: 85.6 fL (ref 80.0–100.0)
Monocytes Absolute: 1.1 10*3/uL — ABNORMAL HIGH (ref 0.1–1.0)
Monocytes Relative: 8 %
Neutro Abs: 8.2 10*3/uL — ABNORMAL HIGH (ref 1.7–7.7)
Neutrophils Relative %: 60 %
Platelets: 280 10*3/uL (ref 150–400)
RBC: 5.57 MIL/uL (ref 4.22–5.81)
RDW: 14.2 % (ref 11.5–15.5)
WBC: 13.8 10*3/uL — ABNORMAL HIGH (ref 4.0–10.5)
nRBC: 0 % (ref 0.0–0.2)

## 2021-05-14 LAB — BASIC METABOLIC PANEL
Anion gap: 13 (ref 5–15)
Anion gap: 13 (ref 5–15)
BUN: 53 mg/dL — ABNORMAL HIGH (ref 6–20)
BUN: 54 mg/dL — ABNORMAL HIGH (ref 6–20)
CO2: 29 mmol/L (ref 22–32)
CO2: 29 mmol/L (ref 22–32)
Calcium: 9.4 mg/dL (ref 8.9–10.3)
Calcium: 8.9 mg/dL (ref 8.9–10.3)
Chloride: 104 mmol/L (ref 98–111)
Chloride: 102 mmol/L (ref 98–111)
Creatinine, Ser: 1.86 mg/dL — ABNORMAL HIGH (ref 0.61–1.24)
Creatinine, Ser: 1.81 mg/dL — ABNORMAL HIGH (ref 0.61–1.24)
GFR, Estimated: 43 mL/min — ABNORMAL LOW (ref >60)
GFR, Estimated: 44 mL/min — ABNORMAL LOW (ref >60)
Glucose, Bld: 169 mg/dL — ABNORMAL HIGH (ref 70–99)
Glucose, Bld: 192 mg/dL — ABNORMAL HIGH (ref 70–99)
Potassium: 3.8 mmol/L (ref 3.5–5.1)
Potassium: 3.6 mmol/L (ref 3.5–5.1)
Sodium: 146 mmol/L — ABNORMAL HIGH (ref 135–145)
Sodium: 144 mmol/L (ref 135–145)

## 2021-05-14 LAB — POCT I-STAT 7, (LYTES, BLD GAS, ICA,H+H)
Acid-Base Excess: 5 mmol/L — ABNORMAL HIGH (ref 0.0–2.0)
Acid-Base Excess: 8 mmol/L — ABNORMAL HIGH (ref 0.0–2.0)
Bicarbonate: 30 mmol/L — ABNORMAL HIGH (ref 20.0–28.0)
Bicarbonate: 33.8 mmol/L — ABNORMAL HIGH (ref 20.0–28.0)
Calcium, Ion: 1.14 mmol/L — ABNORMAL LOW (ref 1.15–1.40)
Calcium, Ion: 1.15 mmol/L (ref 1.15–1.40)
HCT: 47 % (ref 39.0–52.0)
HCT: 47 % (ref 39.0–52.0)
Hemoglobin: 16 g/dL (ref 13.0–17.0)
Hemoglobin: 16 g/dL (ref 13.0–17.0)
O2 Saturation: 96 %
O2 Saturation: 98 %
Patient temperature: 100
Patient temperature: 101.1
Potassium: 4.1 mmol/L (ref 3.5–5.1)
Potassium: 3.7 mmol/L (ref 3.5–5.1)
Sodium: 148 mmol/L — ABNORMAL HIGH (ref 135–145)
Sodium: 147 mmol/L — ABNORMAL HIGH (ref 135–145)
TCO2: 31 mmol/L (ref 22–32)
TCO2: 35 mmol/L — ABNORMAL HIGH (ref 22–32)
pCO2 arterial: 45.2 mmHg (ref 32.0–48.0)
pCO2 arterial: 53.3 mmHg — ABNORMAL HIGH (ref 32.0–48.0)
pH, Arterial: 7.433 (ref 7.350–7.450)
pH, Arterial: 7.416 (ref 7.350–7.450)
pO2, Arterial: 84 mmHg (ref 83.0–108.0)
pO2, Arterial: 110 mmHg — ABNORMAL HIGH (ref 83.0–108.0)

## 2021-05-14 LAB — CULTURE, RESPIRATORY W GRAM STAIN: Culture: NORMAL

## 2021-05-14 LAB — COOXEMETRY PANEL
Carboxyhemoglobin: 0.8 % (ref 0.5–1.5)
Carboxyhemoglobin: 0.7 % (ref 0.5–1.5)
Methemoglobin: 1 % (ref 0.0–1.5)
Methemoglobin: 0.8 % (ref 0.0–1.5)
O2 Saturation: 68.2 %
O2 Saturation: 70.7 %
Total hemoglobin: 17 g/dL — ABNORMAL HIGH (ref 12.0–16.0)
Total hemoglobin: 16.6 g/dL — ABNORMAL HIGH (ref 12.0–16.0)

## 2021-05-14 LAB — COMPREHENSIVE METABOLIC PANEL
ALT: 65 U/L — ABNORMAL HIGH (ref 0–44)
AST: 83 U/L — ABNORMAL HIGH (ref 15–41)
Albumin: 3.2 g/dL — ABNORMAL LOW (ref 3.5–5.0)
Alkaline Phosphatase: 136 U/L — ABNORMAL HIGH (ref 38–126)
Anion gap: 11 (ref 5–15)
BUN: 55 mg/dL — ABNORMAL HIGH (ref 6–20)
CO2: 30 mmol/L (ref 22–32)
Calcium: 8.7 mg/dL — ABNORMAL LOW (ref 8.9–10.3)
Chloride: 104 mmol/L (ref 98–111)
Creatinine, Ser: 1.8 mg/dL — ABNORMAL HIGH (ref 0.61–1.24)
GFR, Estimated: 45 mL/min — ABNORMAL LOW (ref >60)
Glucose, Bld: 196 mg/dL — ABNORMAL HIGH (ref 70–99)
Potassium: 4.1 mmol/L (ref 3.5–5.1)
Sodium: 145 mmol/L (ref 135–145)
Total Bilirubin: 3 mg/dL — ABNORMAL HIGH (ref 0.3–1.2)
Total Protein: 8.2 g/dL — ABNORMAL HIGH (ref 6.5–8.1)

## 2021-05-14 LAB — HEPATIC FUNCTION PANEL
ALT: 63 U/L — ABNORMAL HIGH (ref 0–44)
AST: 65 U/L — ABNORMAL HIGH (ref 15–41)
Albumin: 3.2 g/dL — ABNORMAL LOW (ref 3.5–5.0)
Alkaline Phosphatase: 138 U/L — ABNORMAL HIGH (ref 38–126)
Bilirubin, Direct: 0.7 mg/dL — ABNORMAL HIGH (ref 0.0–0.2)
Indirect Bilirubin: 1.5 mg/dL — ABNORMAL HIGH (ref 0.3–0.9)
Total Bilirubin: 2.2 mg/dL — ABNORMAL HIGH (ref 0.3–1.2)
Total Protein: 8.3 g/dL — ABNORMAL HIGH (ref 6.5–8.1)

## 2021-05-14 LAB — GLUCOSE, CAPILLARY
Glucose-Capillary: 132 mg/dL — ABNORMAL HIGH (ref 70–99)
Glucose-Capillary: 140 mg/dL — ABNORMAL HIGH (ref 70–99)
Glucose-Capillary: 142 mg/dL — ABNORMAL HIGH (ref 70–99)
Glucose-Capillary: 148 mg/dL — ABNORMAL HIGH (ref 70–99)
Glucose-Capillary: 152 mg/dL — ABNORMAL HIGH (ref 70–99)
Glucose-Capillary: 171 mg/dL — ABNORMAL HIGH (ref 70–99)
Glucose-Capillary: 183 mg/dL — ABNORMAL HIGH (ref 70–99)
Glucose-Capillary: 196 mg/dL — ABNORMAL HIGH (ref 70–99)

## 2021-05-14 LAB — BLOOD GAS, VENOUS
Acid-Base Excess: 4.8 mmol/L — ABNORMAL HIGH (ref 0.0–2.0)
Acid-Base Excess: 8.5 mmol/L — ABNORMAL HIGH (ref 0.0–2.0)
Bicarbonate: 30.1 mmol/L — ABNORMAL HIGH (ref 20.0–28.0)
Bicarbonate: 32.8 mmol/L — ABNORMAL HIGH (ref 20.0–28.0)
Drawn by: 2869
Drawn by: 2869
O2 Saturation: 58.4 %
O2 Saturation: 61.3 %
Patient temperature: 37
Patient temperature: 37
pCO2, Ven: 47.9 mmHg (ref 44.0–60.0)
pCO2, Ven: 55.6 mmHg (ref 44.0–60.0)
pH, Ven: 7.352 (ref 7.250–7.430)
pH, Ven: 7.45 — ABNORMAL HIGH (ref 7.250–7.430)
pO2, Ven: 33 mmHg (ref 32.0–45.0)
pO2, Ven: 37.1 mmHg (ref 32.0–45.0)

## 2021-05-14 LAB — ECHOCARDIOGRAM COMPLETE
AR max vel: 1.22 cm2
AV Area VTI: 1.25 cm2
AV Area mean vel: 1.39 cm2
AV Mean grad: 4 mmHg
AV Peak grad: 7.7 mmHg
Ao pk vel: 1.39 m/s
Area-P 1/2: 6.32 cm2
Height: 68 in
MV M vel: 2 m/s
MV Peak grad: 16 mmHg
S' Lateral: 7.5 cm
Weight: 5562.65 oz

## 2021-05-14 LAB — TRIGLYCERIDES: Triglycerides: 221 mg/dL — ABNORMAL HIGH (ref <150)

## 2021-05-14 LAB — CBC
HCT: 51.4 % (ref 39.0–52.0)
HCT: 46.7 % (ref 39.0–52.0)
Hemoglobin: 18.1 g/dL — ABNORMAL HIGH (ref 13.0–17.0)
Hemoglobin: 16.3 g/dL (ref 13.0–17.0)
MCH: 30.1 pg (ref 26.0–34.0)
MCH: 29.9 pg (ref 26.0–34.0)
MCHC: 35.2 g/dL (ref 30.0–36.0)
MCHC: 34.9 g/dL (ref 30.0–36.0)
MCV: 85.5 fL (ref 80.0–100.0)
MCV: 85.5 fL (ref 80.0–100.0)
Platelets: 305 10*3/uL (ref 150–400)
Platelets: 242 10*3/uL (ref 150–400)
RBC: 6.01 MIL/uL — ABNORMAL HIGH (ref 4.22–5.81)
RBC: 5.46 MIL/uL (ref 4.22–5.81)
RDW: 14.2 % (ref 11.5–15.5)
RDW: 14.2 % (ref 11.5–15.5)
WBC: 20 10*3/uL — ABNORMAL HIGH (ref 4.0–10.5)
WBC: 13.6 10*3/uL — ABNORMAL HIGH (ref 4.0–10.5)
nRBC: 0 % (ref 0.0–0.2)
nRBC: 0 % (ref 0.0–0.2)

## 2021-05-14 LAB — PHOSPHORUS
Phosphorus: 3.8 mg/dL (ref 2.5–4.6)
Phosphorus: 3.3 mg/dL (ref 2.5–4.6)

## 2021-05-14 LAB — LACTIC ACID, PLASMA
Lactic Acid, Venous: 2.4 mmol/L (ref 0.5–1.9)
Lactic Acid, Venous: 1.6 mmol/L (ref 0.5–1.9)

## 2021-05-14 LAB — HEPARIN LEVEL (UNFRACTIONATED)
Heparin Unfractionated: 0.44 IU/mL (ref 0.30–0.70)
Heparin Unfractionated: 0.43 IU/mL (ref 0.30–0.70)

## 2021-05-14 LAB — MAGNESIUM
Magnesium: 2.8 mg/dL — ABNORMAL HIGH (ref 1.7–2.4)
Magnesium: 2.5 mg/dL — ABNORMAL HIGH (ref 1.7–2.4)
Magnesium: 3.1 mg/dL — ABNORMAL HIGH (ref 1.7–2.4)

## 2021-05-14 MED ORDER — ROCURONIUM BROMIDE 10 MG/ML (PF) SYRINGE
PREFILLED_SYRINGE | INTRAVENOUS | Status: AC
  Filled 2021-05-14: qty 10

## 2021-05-14 MED ORDER — DOCUSATE SODIUM 50 MG/5ML PO LIQD
100.0000 mg | Freq: Two times a day (BID) | ORAL | Status: DC
  Administered 2021-05-14: 100 mg
  Filled 2021-05-14: qty 10

## 2021-05-14 MED ORDER — AMIODARONE LOAD VIA INFUSION
150.0000 mg | Freq: Once | INTRAVENOUS | Status: AC
  Administered 2021-05-14: 150 mg via INTRAVENOUS
  Filled 2021-05-14: qty 83.34

## 2021-05-14 MED ORDER — ETOMIDATE 2 MG/ML IV SOLN
INTRAVENOUS | Status: DC | PRN
  Administered 2021-05-14: 20 mg via INTRAVENOUS

## 2021-05-14 MED ORDER — AMIODARONE HCL IN DEXTROSE 360-4.14 MG/200ML-% IV SOLN
30.0000 mg/h | INTRAVENOUS | Status: DC
  Administered 2021-05-14 – 2021-05-16 (×8): 60 mg/h via INTRAVENOUS
  Administered 2021-05-16: 30 mg/h via INTRAVENOUS
  Administered 2021-05-16 (×2): 60 mg/h via INTRAVENOUS
  Filled 2021-05-14 (×9): qty 200

## 2021-05-14 MED ORDER — GABAPENTIN 250 MG/5ML PO SOLN
100.0000 mg | Freq: Three times a day (TID) | ORAL | Status: DC
  Filled 2021-05-14 (×4): qty 2

## 2021-05-14 MED ORDER — DIGOXIN 0.25 MG/ML IJ SOLN
0.2500 mg | Freq: Once | INTRAMUSCULAR | Status: AC
  Administered 2021-05-14: 0.25 mg via INTRAVENOUS
  Filled 2021-05-14: qty 2

## 2021-05-14 MED ORDER — FENTANYL BOLUS VIA INFUSION
50.0000 ug | INTRAVENOUS | Status: DC | PRN
  Filled 2021-05-14: qty 100

## 2021-05-14 MED ORDER — PROPOFOL 1000 MG/100ML IV EMUL
INTRAVENOUS | Status: AC
  Administered 2021-05-14: 20 ug/kg/min via INTRAVENOUS
  Filled 2021-05-14: qty 100

## 2021-05-14 MED ORDER — SODIUM CHLORIDE 0.9 % IV SOLN
2.0000 g | INTRAVENOUS | Status: DC
  Administered 2021-05-14 – 2021-05-16 (×3): 2 g via INTRAVENOUS
  Filled 2021-05-14 (×3): qty 20

## 2021-05-14 MED ORDER — FENTANYL CITRATE (PF) 100 MCG/2ML IJ SOLN
INTRAMUSCULAR | Status: AC
  Administered 2021-05-14: 100 ug via INTRAVENOUS
  Filled 2021-05-14: qty 2

## 2021-05-14 MED ORDER — ACETAMINOPHEN 10 MG/ML IV SOLN
1000.0000 mg | Freq: Once | INTRAVENOUS | Status: AC
  Administered 2021-05-14: 1000 mg via INTRAVENOUS
  Filled 2021-05-14: qty 100

## 2021-05-14 MED ORDER — HEPARIN (PORCINE) 25000 UT/250ML-% IV SOLN
2750.0000 [IU]/h | INTRAVENOUS | Status: AC
  Administered 2021-05-14: 2000 [IU]/h via INTRAVENOUS
  Administered 2021-05-14: 1600 [IU]/h via INTRAVENOUS
  Administered 2021-05-15: 2750 [IU]/h via INTRAVENOUS
  Administered 2021-05-15: 2400 [IU]/h via INTRAVENOUS
  Administered 2021-05-16 – 2021-05-18 (×6): 2750 [IU]/h via INTRAVENOUS
  Filled 2021-05-14 (×11): qty 250

## 2021-05-14 MED ORDER — SODIUM CHLORIDE 0.9 % IV SOLN: INTRAVENOUS | Status: DC | PRN

## 2021-05-14 MED ORDER — CLONAZEPAM 1 MG PO TABS: 1.0000 mg | ORAL_TABLET | Freq: Two times a day (BID) | ORAL | Status: DC | PRN

## 2021-05-14 MED ORDER — FENTANYL 2500MCG IN NS 250ML (10MCG/ML) PREMIX INFUSION
50.0000 ug/h | INTRAVENOUS | Status: DC
  Administered 2021-05-14: 50 ug/h via INTRAVENOUS
  Filled 2021-05-14: qty 250

## 2021-05-14 MED ORDER — AMIODARONE IV BOLUS ONLY 150 MG/100ML: 150.0000 mg | Freq: Once | INTRAVENOUS | Status: DC

## 2021-05-14 MED ORDER — MIDAZOLAM HCL 2 MG/2ML IJ SOLN
INTRAMUSCULAR | Status: AC
  Filled 2021-05-14: qty 2

## 2021-05-14 MED ORDER — PROPOFOL 1000 MG/100ML IV EMUL
0.0000 ug/kg/min | INTRAVENOUS | Status: DC
  Administered 2021-05-14: 30 ug/kg/min via INTRAVENOUS
  Administered 2021-05-14 (×2): 40 ug/kg/min via INTRAVENOUS
  Filled 2021-05-14 (×2): qty 100

## 2021-05-14 MED ORDER — SUCCINYLCHOLINE CHLORIDE 20 MG/ML IJ SOLN
INTRAMUSCULAR | Status: DC | PRN
  Administered 2021-05-14: 180 mg via INTRAVENOUS

## 2021-05-14 MED ORDER — SUCCINYLCHOLINE CHLORIDE 200 MG/10ML IV SOSY
PREFILLED_SYRINGE | INTRAVENOUS | Status: AC
  Filled 2021-05-14: qty 10

## 2021-05-14 MED ORDER — PERFLUTREN LIPID MICROSPHERE
1.0000 mL | INTRAVENOUS | Status: AC | PRN
  Administered 2021-05-14: 2 mL via INTRAVENOUS
  Filled 2021-05-14: qty 10

## 2021-05-14 MED ORDER — OXYCODONE-ACETAMINOPHEN 5-325 MG PO TABS: 1.0000 | ORAL_TABLET | ORAL | Status: DC | PRN

## 2021-05-14 MED ORDER — ETOMIDATE 2 MG/ML IV SOLN
INTRAVENOUS | Status: AC
  Filled 2021-05-14: qty 20

## 2021-05-14 MED ORDER — NALOXONE HCL 0.4 MG/ML IJ SOLN
0.4000 mg | Freq: Once | INTRAMUSCULAR | Status: AC
  Administered 2021-05-14: 0.4 mg via INTRAVENOUS
  Filled 2021-05-14: qty 1

## 2021-05-14 MED ORDER — SERTRALINE HCL 50 MG PO TABS
100.0000 mg | ORAL_TABLET | Freq: Every day | ORAL | Status: DC
  Administered 2021-05-14: 100 mg
  Filled 2021-05-14: qty 2

## 2021-05-14 MED ORDER — POTASSIUM CHLORIDE 10 MEQ/50ML IV SOLN
10.0000 meq | INTRAVENOUS | Status: AC
  Administered 2021-05-14 (×4): 10 meq via INTRAVENOUS
  Filled 2021-05-14 (×4): qty 50

## 2021-05-14 MED ORDER — KETAMINE HCL 50 MG/5ML IJ SOSY
PREFILLED_SYRINGE | INTRAMUSCULAR | Status: AC
  Filled 2021-05-14: qty 5

## 2021-05-14 MED ORDER — DIGOXIN 0.05 MG/ML PO SOLN
0.2500 mg | Freq: Every day | ORAL | Status: DC
  Filled 2021-05-14: qty 5

## 2021-05-14 MED ORDER — FENTANYL CITRATE (PF) 100 MCG/2ML IJ SOLN
50.0000 ug | INTRAMUSCULAR | Status: DC | PRN
  Administered 2021-05-14: 100 ug via INTRAVENOUS
  Filled 2021-05-14: qty 2

## 2021-05-14 MED ORDER — MAGNESIUM SULFATE 2 GM/50ML IV SOLN
2.0000 g | Freq: Once | INTRAVENOUS | Status: AC
  Administered 2021-05-14: 2 g via INTRAVENOUS
  Filled 2021-05-14: qty 50

## 2021-05-14 MED ORDER — DIGOXIN 250 MCG PO TABS
0.2500 mg | ORAL_TABLET | Freq: Every day | ORAL | Status: DC
  Filled 2021-05-14: qty 1

## 2021-05-14 MED ORDER — METOPROLOL TARTRATE 5 MG/5ML IV SOLN
5.0000 mg | INTRAVENOUS | Status: AC | PRN
  Administered 2021-05-14 – 2021-05-15 (×3): 5 mg via INTRAVENOUS
  Filled 2021-05-14 (×2): qty 5

## 2021-05-14 MED ORDER — POLYETHYLENE GLYCOL 3350 17 G PO PACK
17.0000 g | PACK | Freq: Every day | ORAL | Status: DC
  Administered 2021-05-14: 17 g
  Filled 2021-05-14: qty 1

## 2021-05-14 MED ORDER — FENTANYL CITRATE (PF) 100 MCG/2ML IJ SOLN: 50.0000 ug | INTRAMUSCULAR | Status: DC | PRN

## 2021-05-14 MED ORDER — AMIODARONE IV BOLUS ONLY 150 MG/100ML
150.0000 mg | Freq: Once | INTRAVENOUS | Status: AC
  Administered 2021-05-14: 150 mg via INTRAVENOUS

## 2021-05-14 MED ORDER — LACTATED RINGERS IV SOLN: INTRAVENOUS | Status: DC | PRN

## 2021-05-14 MED ORDER — POLYETHYLENE GLYCOL 3350 17 G PO PACK: 17.0000 g | PACK | Freq: Every day | ORAL | Status: DC | PRN

## 2021-05-14 MED ORDER — IPRATROPIUM-ALBUTEROL 0.5-2.5 (3) MG/3ML IN SOLN
3.0000 mL | RESPIRATORY_TRACT | Status: DC
  Administered 2021-05-14 (×4): 3 mL via RESPIRATORY_TRACT
  Filled 2021-05-14 (×4): qty 3

## 2021-05-14 NOTE — Code Documentation (Signed)
[facility] Code Blue Note   I was called for Code [facility] for Mr. Holliman although he had achieved ROSC by my arrival. He was initially noted to be in pulseless VT following A-Fib with RVR s/p extubation. He received 1 shock and 1 round of CPR although achieved ROSC and thereafter became responsive and HDS in sinus tachycardia in the 130's.   Labs were sent including: STAT CBC w/ Diff, CMP, Mag, ionized calcium, lactic acid, troponin, and VBG  Jeralyn Bennett, MD 05/14/2021, 2:44 AM Pager: [phone]

## 2021-05-14 NOTE — Progress Notes (Addendum)
[place] notified of unchanged heart rate and rhythm despite amio bolus and drip started. Also notified [facility] of BMP and venous blood gas sent and resulted, and patient noncomplaince of keeping the BPAP mask or nasal cannula on despite multiple attempts to educate the patient. Patient is restless and pull equipment off.   0110 Spoke with Dr. Gillermina Phy about concerns of above issues. 5mg  lopressor and potassium replacement ordered.   0125 patient continuing to pull off oxygen and taking equipment off. RASS +1. Oriented x4. Precedex started at 0.78mcg/kg/hr.

## 2021-05-14 NOTE — Progress Notes (Signed)
eLink Physician-Brief Progress Note Patient Name: [name] DOB: [birth year] MRN: [id number]   Date of Service  05/14/2021  HPI/Events of Note  K 3.5. Cr 1.6. Patient not keeping BiPAP on. Afib with RVR 130-150 bpm despite amio bolus + drip.   eICU Interventions  Replete K with 40 mEq IV KCl  Ordered metoprolol 5mg  IV (up to 3 doses with BP hold parameters).     Intervention Category Intermediate Interventions: Arrhythmia - evaluation and management  Marily Lente Morna Flud 05/14/2021, 1:14 AM

## 2021-05-14 NOTE — Progress Notes (Signed)
Critical Value: Troponin 170 Lactic acid 2.4  Date and time results received: 05/14/21 4:08 AM   Name of Provider Notified: elink MD  Orders Received? Or Actions Taken? See orders

## 2021-05-14 NOTE — Progress Notes (Signed)
NAME:  [name], MRN:  [id number], DOB:  1966/10/13, LOS: 4 ADMISSION DATE:  05/09/2021, CONSULTATION DATE:  05/10/21 REFERRING MD:  EDP, CHIEF COMPLAINT:  SOB, chest pain   History of Present Illness:  55 year old man who presented with shortness of breath. This has been refractory to bronchodilators.  In ER, received some fluids and got worse.  Placed on BIPAP but began having possible frank hemoptysis so intubated for airway protection.  CTA read as multifocal pneumonia although this was underwhelming on my read.  Currently on high vent requirements and PCCM to admit.  Pertinent  Medical History  Metabolic syndrome CHF Afib and prior VTE on AC Alcohol abuse Vaping Bipolar 1  Significant Hospital Events:  6/10 intubation, CVL, admission. Initial concern for lung injury related to vaping but patient significantly improved with aggressive diuresing which indication SOB, cough, and hemoptysis was likely secondary to severe volume overload in the setting of severe CHF with chronic anticoagulation  6/10 No acute events overnight, diuresed well with IV lasix yesterday. Vent setting much improved  6/11 Continue to diurese well with 2L removed yesterday. Pressor and sedation requirements also decreased  6/12 diuresis 6/13 extubated 6/14 refractory afib/ agitation leading to Vfib arrest x 1 min, reintubation  Interim History / Subjective:  Deteriorated last night back on vent. Remains in refractory afib/RVR and shock  Objective   Blood pressure 101/75, pulse (!) 142, temperature 99.6 F (37.6 C), temperature source Oral, resp. rate (!) 24, height 5\' 8"  (1.727 m), weight (!) 157.7 kg, SpO2 100 %.    Vent Mode: PRVC FiO2 (%):  [40 %-100 %] 60 % Set Rate:  [24 bmp] 24 bmp Vt Set:  [550 mL] 550 mL PEEP:  [5 cmH20] 5 cmH20   Intake/Output Summary (Last 24 hours) at 05/14/2021 0730 Last data filed at 05/14/2021 0600 Gross per 24 hour  Intake 2113.62 ml  Output 3609 ml  Net -1495.38  ml    Filed Weights   05/12/21 0315 05/13/21 0500 05/14/21 0500  Weight: (!) 172.6 kg (!) 167.8 kg (!) 157.7 kg    Examination: Constitutional: sedated man on vent  Eyes: pupils reactive, not tracking Ears, nose, mouth, and throat: ETT in place with minimal secretions Cardiovascular: Irregular, ext warm Respiratory: Diminished at bases with crackles, no accessory muscle use Gastrointestinal: soft, +BS Skin: No rashes, normal turgor Neurologic: was intact prior to arrest last night, currently heavily sedated Psychiatric: RASS -5     Labs/imaging that I havepersonally reviewed   6/10 CTA chest No PE Multifocal pneumonia. COVID-19 infection not excluded. Likely reactive right hilar and mediastinal lymph node. Cardiomegaly 6/10 ECHO EF less than 20%, with global hypokinesis  Coox pending ABG ok BUN/Cr up CXR still look wet  Resolved Hospital Problem list   N/a  Assessment & Plan:  Acute hypoxemic respiratory failure- recurrent after arrest likely related to poor forward flow and recurrent edema Hemoptysis and peri-bronchovascular ground lass opacities- improvement with PEEP/ diuresis argues this was fluid overload; tracheal aspirate pending Severe OSA Chronic NICM, question of EtOH induced: his EF is actually lower even with abstinence Acute Kidney Injury - stabilized  Hx of Afib/VTE- on AC PTA Hx of HTN/HLD DM2 with hyperglycemia- minimal SSI need History of anxiety and bipolar New 6/14 Vfib arrest- brief, in setting of extubation, afib, resp distress, inability to tolerate BIPAP; heart is weaker than previously thought  - Attempt cardioversion now that underlying issue has been fixed (loss of PEEP), continue amiodarone  drip; ?intolerance to dig, give some empiric dig - Switch to heparin drip from xarelto - Check coox/CVP, suspect we may need to do inotropes + further diuresis, will bring CHF team on board - Probably needs AICD - Continue vent support while we try to  optimize volume status - Will update family   Best practice   Diet:  on hold for extubation Pain/Anxiety/Delirium protocol (if indicated): No VAP protocol (if indicated): Yes DVT prophylaxis: heparin gtt GI prophylaxis: PPI Glucose control:  SSI No Central venous access:  Yes, and it is still needed Arterial line:  Yes, and it is still needed Foley:  Yes, and it is still needed Mobility:  bed rest  PT consulted: N/A Last date of multidisciplinary goals of care discussion: Wife updated at bedside 6/13 Code Status:  full code Disposition: ICU  38 minutes cc time not including any separately billable procedures Erskine Emery MD PCCM

## 2021-05-14 NOTE — Progress Notes (Addendum)
PCCM INTERVAL PROGRESS NOTE  Called to bedside for patient with lethargy and again converted into AF RVR. RN reports rising fever up to 101.1. Tylenol IV given. Family reports lethargy has worsened in the last 30 minutes prior to my arrival. On exam the patient is somnolent, but does arouse to verbal stimuli and is able to speak in full sentences. AF RVR on monitor with rates in the 150s and hypotensive with SBP in the 90s.   ABG    Component Value Date/Time   PHART 7.416 05/14/2021 1704   PCO2ART 53.3 (H) 05/14/2021 1704   PO2ART 110 (H) 05/14/2021 1704   HCO3 33.8 (H) 05/14/2021 1704   TCO2 35 (H) 05/14/2021 1704   ACIDBASEDEF 2.0 05/10/2021 1035   O2SAT 98.0 05/14/2021 1704    Coox, BMP, CBC, Mag, phos pending   - Gave 0.4 narcan and he has perked up some  Plan: - At risk for intubation, will hold off for now as he is waking up a bit - Start levophed is SBP drops below 90 mmHg - Febrile with rhonchi and productive cough. Will start ceftriaxone.  - Patient seen by EP and amiodarone has been bolused.  - Ensure QHS BiPAP - Supplemental oxygen weaned to keep sats 88-92%. They have been 100% which may decrease respiratory drive with sleep disordered breathing.  - D/w Dr. Haroldine Laws. Will bolus amiodarone once again and give 0.25 mg Digoxin.   40 minutes critical care time.    Georgann Housekeeper, AGACNP-BC [facility]  See Amion for personal pager PCCM on call pager [phone] until 7pm Please call Elink 7p-7a. [phone]  05/14/2021 5:19 PM

## 2021-05-14 NOTE — Progress Notes (Signed)
Patient keeps taking BiPAP off. RT and RN has tried to talk with patient on the importance of wearing it. Pt refuses to wear.

## 2021-05-14 NOTE — Procedures (Signed)
Cardioversion  Indication: refractory Afib, shock Sedation: propofol ongoing, fentanyl 158mcg x 1 AC: only missed 1 dose xarelto, in place PTA Description: 150J synchronized cardioversion restored sinus rhythm No complications Monitor closely Check coox

## 2021-05-14 NOTE — Procedures (Signed)
Extubation Procedure Note  Patient Details:   Name: [name] DOB: 06-17-1966 MRN: [id number]   Airway Documentation:    Vent end date: 05/14/21 Vent end time: 1414   Evaluation  O2 sats: stable throughout Complications: No apparent complications Patient did tolerate procedure well. Bilateral Breath Sounds: Diminished, Clear   Yes    [name] 05/14/2021, 2:15 PM

## 2021-05-14 NOTE — Progress Notes (Signed)
2D echocardiogram with Definity completed.  05/14/2021 12:14 PM Kelby Aline., MHA, RVT, RDCS, RDMS

## 2021-05-14 NOTE — Progress Notes (Signed)
Cross covering ICU physician.   Pt poorly responsive entered back in afib with rvr given amio bolus and bp decreased somewhat but still with systolic >25 and map >83.   Narcan given as well.  Abg acceptable without hypercarbia or hypoxemia.   Pt's mental status slowly improving.  Suspect that when enters into afib with rvr his loses his forward flow suddenly in light of this chronic cm. And alertness/responsiveness wanes.   Concern that with his cough/productive and fevers his fib will be challenging to control also. Continuing amio gtt at 35 but would also resume abx despite normal flora on trach aspirate.   Pt with fevers starting ~48 hours after abx d/c'd and wbc remains elevated (understandably after his vt and dccv) .   Cont to maintain map >65 and sbp >90 unless mentation worsens with then resume levo gtt.

## 2021-05-14 NOTE — Progress Notes (Signed)
eLink Physician-Brief Progress Note Patient Name: [name] DOB: January 07, 1966 MRN: [id number]   Date of Service  05/14/2021  HPI/Events of Note  Camera check - on RA AF-RVR , on amio gtt Has OSA, on home bipap  eICU Interventions  Cleaned MAR BiPAP q hs      Intervention Category Intermediate Interventions: Other:  Leanna Sato Pernella Ackerley 05/14/2021, 8:14 PM

## 2021-05-14 NOTE — Progress Notes (Signed)
Pt was placed on Bipap per CCM MD. Pt continuing to c/o about settings being to high. RT decreased pressures and changed the itime.Pt stated it felt better.

## 2021-05-14 NOTE — Progress Notes (Signed)
0213 Patient in V-fib, unresponsive. CPR started, pads placed, Patient shocked once, CPR continued,  ROSC obtained. See code sheet for more details. MD at bedside. Patient intubated, sedated and levo started.

## 2021-05-14 NOTE — Progress Notes (Signed)
ANTICOAGULATION CONSULT NOTE - Follow Up Consult  Pharmacy Consult for IV Heparin Indication: atrial fibrillation  Allergies  Allergen Reactions   Ace Inhibitors Anaphylaxis and Swelling    Angioedema   Bidil [Isosorb Dinitrate-Hydralazine] Other (See Comments)    headache   Digoxin And Related     Unspecified "side effects"   Buspirone Other (See Comments)    dizziness    Patient Measurements: Height: 5\' 8"  (172.7 cm) Weight: (!) 157.7 kg (347 lb 10.7 oz) IBW/kg (Calculated) : 68.4 Heparin Dosing Weight: 111 kg  Vital Signs: Temp: 98.8 F (37.1 C) (06/14 1800) Temp Source: Axillary (06/14 1800) BP: 89/79 (06/14 1845) Pulse Rate: 122 (06/14 1845)  Labs: Recent Labs    05/13/21 2326 05/14/21 0227 05/14/21 0301 05/14/21 0333 05/14/21 0600 05/14/21 0804 05/14/21 1648 05/14/21 1704 05/14/21 1705  HGB  --  18.1* 16.6 16.0  --   --  16.3 16.0  --   HCT  --  51.4 47.7 47.0  --   --  46.7 47.0  --   PLT  --  305 280  --   --   --  242  --   --   APTT  --   --   --   --   --  28  --   --  33  HEPARINUNFRC  --   --   --   --   --  0.44  --   --  0.43  CREATININE 1.66* 1.86* 1.80*  --   --   --   --   --   --   TROPONINIHS  --   --  170*  --  198*  --   --   --   --     Estimated Creatinine Clearance: 70.7 mL/min (A) (by C-G formula based on SCr of 1.8 mg/dL (H)).   Medications:  Infusions:   sodium chloride     amiodarone 60 mg/hr (05/14/21 1800)   cefTRIAXone (ROCEPHIN)  IV 200 mL/hr at 05/14/21 1800   fentaNYL infusion INTRAVENOUS Stopped (05/14/21 1120)   heparin 1,600 Units/hr (05/14/21 1800)   norepinephrine (LEVOPHED) Adult infusion 2 mcg/min (05/14/21 1500)   propofol (DIPRIVAN) infusion Stopped (05/14/21 1132)    Assessment: 55 years of age male transitioned for Xarelto to IV Heparin earlier today in case of procedures.   Initial heparin level is 0.43 - impacted by recent Xarelto. Utilizing aPTT at this time.  Initial aPTT is low at 0.33 but  increasing on current Heparin rate of 1600 units/hr.  No bleeding reported.   Goal of Therapy:  Heparin level 0.3-0.7 units/ml aPTT 66-102 seconds Monitor platelets by anticoagulation protocol: Yes   Plan:  Increase IV Heparin to 2000 units/hr. aPTT and Heparin level in 6 hours.  Daily aPTT, Heparin level, and CBC while on therapy.  Sloan Leiter, PharmD, BCPS, BCCCP Clinical Pharmacist Please refer to [facility] for [place] numbers 05/14/2021,7:12 PM

## 2021-05-14 NOTE — Progress Notes (Signed)
PT Cancellation/Discharge Note  Patient Details Name: [name] MRN: [id number] DOB: 08-27-1966   Cancelled Treatment:    Reason Eval/Treat Not Completed: Patient not medically ready  Noted pt coded, intubated, and sedated since PT consult entered. Will sign-off and await new orders if appropriate.   Arby Barrette, PT Pager [phone]   Rexanne Mano 05/14/2021, 8:08 AM

## 2021-05-14 NOTE — Progress Notes (Signed)
CSW attempted to visit the patient at bedside to introduce self as the heart failure social worker and to complete a very brief SDOH screening with the patient to address social needs as needed however the patient was unresponsive and unable to engage in conversation at this time due to vent.   CSW will continue to follow for discharge needs and will check back with the patient at another time.  Purvis Sidle, MSW, [place] Heart Failure Social Worker

## 2021-05-14 NOTE — Consult Note (Addendum)
Cardiology Consultation:   Patient ID: OSLO HUNTSMAN MRN: [id number]; DOB: December 06, 1965  Admit date: 05/09/2021 Date of Consult: 05/14/2021  PCP:  [name], MD   [facility] Providers Cardiologist:  Fransico Him, MD  Advanced Heart Failure:  [name], MD  {   Patient Profile:   [name] is a 55 y.o. male with a hx of NICM (suspect ETOH), HTN, AFib, OSA w/BIPAP, DM, chronic CHF (systolic), hx of PE (9323, smoker, anxiety who is being seen 05/14/2021 for the evaluation of C arrest, consideration for ICD at the request of Dr. Haroldine Laws.  History of Present Illness:   [name] is followed out patient by HF team and Dr. Radford Pax his hx is layed out well by HF team  PE 2015 took xarelto for a year  admitted 11/12/15-11/14/15 with acute on chronic systolic CHF and palpitations. He wore a 30 day event monitor at discharge as he had frequent PVC's and questionable Afib on telemetry. Also with some NSVT, so he was started on Amiodaone, but at follow up had not started taking it. He had previously refused ICD and was seen inpatient by Dr. Rayann Heman who felt that his morbid obesity was a prohibitive factor.  April 2018. He had started drinking ETOH again  Admitted 04/12/17 with SOB, chest pain. D- dimer was 1.16, chest CT without central obstructing PE, however more peripheral and subsegmental pulmonary artery branches were not confidently evaluated due to his body habitus. He was started on a heparin gtt for presumed PE, however VQ scan showed no PE. Troponin was elevated, peaked at 3.37. LHC showed no CAD. Echo showed an EF of 15%, grade 2 DD, no pericarditis. He was diuresed  Echo in 2/21 with EF 20-25%, severe LV dilation  On xarelto outpt for AFib  Admitted 05/10/21 with complaints of SOB, refractory to bronchodilators.  In ER, received some fluids and got worse.  Placed on BIPAP but began having possible frank hemoptysis so intubated for airway protection.  CTA read as  multifocal pneumonia although CCM MD felt was underwhelming on his read.  Currently on high vent requirements and PCCM to admit. Discussed he had tried vaping at home, and initially thught perhaps this was it, though improved with diuretics  He was in Afib on admission  His xarelto was held started on diuretics and extubated yesterday, last evening developed Afib w/RVR >> declined BIPAP became increasingly hypoxic and re-intubated subsequently VT > VF arrest one round of ACL ROSC in 1 minute (shocked once)  Started on amiodarone gtt and heparin gtt HF team consulted today Wife reported intermittent compliance with, good compliance with his CPAP, still drinking but minimally Not felt to be ASC with minimally elevated trops and no CAD in 2018 Felt to be well diuresed and lasix held Coox 68% Suspect initial presentation 2/2 CHF in setting of Afib RVR and concompliance with meds/diuretics  He was recently extubated this afternoon  He is febrile 100.0 > 101.1 most recently NE is off a couple hours now per RN Amio gtt going 60 LABS Last night/Today  K+ 3.5 > 3.8 > 4.1 > 4.1 BUN/Creat 55/1.80 Mag 2.2 2.8, 2.5 HS Trop 170 > 198 WBC 12.5 > 20.0 > 13.8 H/H 16/47 Plts 280   EP is asked to revisit possible ICD  Past Medical History:  Diagnosis Date   Alcohol abuse    Anxiety state, unspecified    Atrial fibrillation (HCC)    CHF (congestive heart failure) ([place])  Chronic systolic heart failure (HCC)    Diabetes mellitus, type II (HCC)    Edema    Gout    History of medication noncompliance    Migraine    Obesity, unspecified    Obstructive sleep apnea    Psychiatric disorder    Pulmonary embolism (HCC)    Shortness of breath     Past Surgical History:  Procedure Laterality Date   CARDIAC CATHETERIZATION     CARDIAC CATHETERIZATION N/A 08/13/2015   Procedure: Right/Left Heart Cath and Coronary Angiography;  Surgeon: [name], MD;  Location: [facility];   Service: Cardiovascular;  Laterality: N/A;   RIGHT/LEFT HEART CATH AND CORONARY ANGIOGRAPHY N/A 04/14/2017   Procedure: Right/Left Heart Cath and Coronary Angiography;  Surgeon: [name], MD;  Location: [facility];  Service: Cardiovascular;  Laterality: N/A;   TESTICLE SURGERY       Home Medications:  Prior to Admission medications   Medication Sig Start Date End Date Taking? Authorizing Provider  albuterol (VENTOLIN HFA) 108 (90 Base) MCG/ACT inhaler Inhale 1-2 puffs into the lungs every 4 (four) hours as needed for shortness of breath or wheezing. 05/08/21  Yes [provider]  ARIPiprazole (ABILIFY) 10 MG tablet Take 1 tablet (10 mg total) by mouth daily. 03/21/21  Yes Nevada Crane, MD  atorvastatin (LIPITOR) 40 MG tablet Take 1 tablet (40 mg total) by mouth daily. 11/20/20  Yes [name], MD  blood glucose meter kit and supplies KIT Use to check blood sugar. DX E11.9 04/30/18  Yes [name], FNP  clonazePAM (KLONOPIN) 1 MG tablet Take 1 tablet (1 mg total) by mouth 2 (two) times daily as needed for anxiety. 03/21/21  Yes Nevada Crane, MD  FARXIGA 10 MG TABS tablet TAKE 1 TABLET BY MOUTH EVERY DAY Patient taking differently: Take 10 mg by mouth daily. 05/29/20  Yes [name], MD  losartan (COZAAR) 25 MG tablet Take 0.5 tablets (12.5 mg total) by mouth at bedtime. 02/22/21  Yes [name], MD  metoprolol succinate (TOPROL-XL) 100 MG 24 hr tablet TAKE 1 TABLET (100 MG TOTAL) BY MOUTH DAILY. TAKE WITH OR IMMEDIATELY FOLLOWING A MEAL. Patient taking differently: Take 100 mg by mouth. 05/30/20  Yes [name], MD  pantoprazole (PROTONIX) 40 MG tablet TAKE 1 TABLET BY MOUTH EVERY DAY Patient taking differently: Take 40 mg by mouth daily. 04/04/21  Yes [name], MD  potassium chloride SA (KLOR-CON) 20 MEQ tablet Take 40 mEq by mouth daily as needed (cramps).   Yes [provider]  PRESCRIPTION MEDICATION Inhale into the lungs See  admin instructions. Bipap, pressure 16/12 with 2L of O2 - use whenever sleeping   Yes [provider]  sertraline (ZOLOFT) 100 MG tablet Take 1 tablet (100 mg total) by mouth daily. 03/21/21  Yes Nevada Crane, MD  sildenafil (REVATIO) 20 MG tablet Take 4 tablets (80 mg total) by mouth daily as needed. Patient taking differently: Take 80 mg by mouth daily as needed (ed). 03/21/20  Yes [name], MD  spironolactone (ALDACTONE) 25 MG tablet TAKE 1 TABLET (25 MG TOTAL) BY MOUTH DAILY. *MUST KEEP 12/20 APPOINTMENT FOR REFILLS* Patient taking differently: Take 25 mg by mouth daily. 12/10/20  Yes [name], MD  thiamine 100 MG tablet Take 1 tablet (100 mg total) by mouth daily. 11/22/20  Yes [name], MD  torsemide (DEMADEX) 20 MG tablet Take 1-1.5 tablets (20-30 mg  total) by mouth daily. 04/18/21  Yes [name], MD  vitamin B-12 (CYANOCOBALAMIN) 1000 MCG tablet Take 1,000 mcg by mouth daily.   Yes [provider]  XARELTO 20 MG TABS tablet TAKE 1 TABLET BY MOUTH EVERY DAY Patient taking differently: Take 20 mg by mouth daily. 03/27/21  Yes [name], MD  pravastatin (PRAVACHOL) 40 MG tablet Take 40 mg by mouth daily.  02/20/12  [provider]    Inpatient Medications: Scheduled Meds:  budesonide (PULMICORT) nebulizer solution  0.5 mg Nebulization BID   chlorhexidine gluconate (MEDLINE KIT)  15 mL Mouth Rinse BID   Chlorhexidine Gluconate Cloth  6 each Topical Daily   docusate  100 mg Per Tube BID   gabapentin  100 mg Per Tube Q8H   insulin aspart  0-20 Units Subcutaneous Q4H   ipratropium-albuterol  3 mL Nebulization Q4H   pantoprazole sodium  40 mg Per Tube QHS   polyethylene glycol  17 g Per Tube Daily   sertraline  100 mg Per Tube Daily   sodium chloride flush  10-40 mL Intracatheter Q12H   Continuous Infusions:  amiodarone 60 mg/hr (05/14/21 1419)   fentaNYL infusion INTRAVENOUS Stopped (05/14/21 1120)   heparin 1,600 Units/hr  (05/14/21 1200)   norepinephrine (LEVOPHED) Adult infusion 2 mcg/min (05/14/21 1500)   propofol (DIPRIVAN) infusion Stopped (05/14/21 1132)   PRN Meds: acetaminophen, clonazePAM, docusate, fentaNYL, fentaNYL (SUBLIMAZE) injection, metoprolol tartrate, ondansetron (ZOFRAN) IV, oxyCODONE-acetaminophen, polyethylene glycol, sodium chloride flush  Allergies:    Allergies  Allergen Reactions   Ace Inhibitors Anaphylaxis and Swelling    Angioedema   Bidil [Isosorb Dinitrate-Hydralazine] Other (See Comments)    headache   Digoxin And Related     Unspecified "side effects"   Buspirone Other (See Comments)    dizziness    Social History:   Social History   Socioeconomic History   Marital status: Married    Spouse name: Not on file   Number of children: 3   Years of education: Not on file   Highest education level: Not on file  Occupational History   Occupation: DISABLED  Tobacco Use   Smoking status: Former    Packs/day: 0.50    Years: 30.00    Pack years: 15.00    Types: Cigarettes    Quit date: 05/15/2020    Years since quitting: 0.9   Smokeless tobacco: Never   Tobacco comments:    vaping - nicotine-free products  Vaping Use   Vaping Use: Never used  Substance and Sexual Activity   Alcohol use: Not Currently    Alcohol/week: 0.0 standard drinks   Drug use: No   Sexual activity: Not Currently  Other Topics Concern   Not on file  Social History Narrative   He smokes about a pack per day and he has been smoking since he was 55 years of age.  He drinks alcohol occasionally, but he denies any illicit drug abuse.  He is presently on disability.    Lives with wife in a 2 story home.  Has 2 children.   Previously worked in [employer], last worked in 1998.   Highest level of education:  11th grade           Social Determinants of Health   Financial Resource Strain: Low Risk    Difficulty of Paying Living Expenses: Not hard at all  Food Insecurity: No Food Insecurity    Worried About Charity fundraiser in the Last Year: Never  true   Ran Out of Food in the Last Year: Never true  Transportation Needs: No Transportation Needs   Lack of Transportation (Medical): No   Lack of Transportation (Non-Medical): No  Physical Activity: Sufficiently Active   Days of Exercise per Week: 5 days   Minutes of Exercise per Session: 30 min  Stress: No Stress Concern Present   Feeling of Stress : Not at all  Social Connections: Moderately Isolated   Frequency of Communication with Friends and Family: More than three times a week   Frequency of Social Gatherings with Friends and Family: Never   Attends Religious Services: Never   Marine scientist or Organizations: No   Attends Music therapist: Never   Marital Status: Married  Human resources officer Violence: Not on file    Family History:   Family History  Problem Relation Age of Onset   Cancer Mother        brain tumor   Hypertension Mother    Diabetes Father        Deceased, 28   Heart disease Maternal Grandmother    Hypertension Other        Family History   Stroke Other        Family History   Diabetes Other        Family History   Diabetes Daughter      ROS:  Please see the history of present illness.  All other ROS reviewed and negative.     Physical Exam/Data:   Vitals:   05/14/21 1415 05/14/21 1430 05/14/21 1445 05/14/21 1500  BP: 131/85 126/77 128/75 131/79  Pulse: 77 75 69 75  Resp: 17 17 (!) 22 20  Temp:      TempSrc:      SpO2: 97% 97% 96% 98%  Weight:      Height:        Intake/Output Summary (Last 24 hours) at 05/14/2021 1538 Last data filed at 05/14/2021 1500 Gross per 24 hour  Intake 1387.42 ml  Output 1663 ml  Net -275.58 ml   Last 3 Weights 05/14/2021 05/13/2021 05/12/2021  Weight (lbs) 347 lb 10.7 oz 369 lb 14.9 oz 380 lb 8.2 oz  Weight (kg) 157.7 kg 167.8 kg 172.6 kg  Some encounter information is confidential and restricted. Go to Review Flowsheets activity to  see all data.     Body mass index is 52.86 kg/m.  General:  morbidly obese, awake, lethargic HEENT: normal Lymph: no adenopathy Neck: difficult to assess Endocrine:  No thryomegaly Vascular: No carotid bruits;   Cardiac:  irreg-irreg, tachycardic no murmur Lungs:  CTA b/l (anterior auscultation), no wheezing, rhonchi or rales  Abd: soft, nontender, obese  Ext: no edema Musculoskeletal:  No deformities Skin: warm and dry  Neuro:  lethargic, no fgross focal abnormalities noted Psych:  Normal affect   EKG:  The EKG was personally reviewed and demonstrates:   AFib 156bpm, IVCD, QRS 152m SR 61bpm, IVCD, 1354mSR 67bpm, IVCD 13859moday AFib 136bpm, QRS 142 AFib 131  Telemetry:  Telemetry was personally reviewed and demonstrates:   SR > AFib 140's . PMVT > MMVT cardioverted > AFib slow > probably flutter 140's > cardioverted > SR 60's > AFib 130's-140's currently  Relevant CV Studies:  05/14/21; TTE IMPRESSIONS   1. Left ventricular ejection fraction, by estimation, is <20%. The left  ventricle has severely decreased function. The left ventricle demonstrates  global hypokinesis. The left ventricular internal cavity size was severely  dilated. Left ventricular  diastolic parameters are indeterminate.   2. Right ventricular systolic function is normal. The right ventricular  size is normal.   3. Left atrial size was mildly dilated.   4. The mitral valve is normal in structure. Trivial mitral valve  regurgitation. No evidence of mitral stenosis.   5. The aortic valve has an indeterminant number of cusps. Aortic valve  regurgitation is not visualized. No aortic stenosis is present.   6. Aortic dilatation noted. There is mild dilatation of the ascending  aorta, measuring 39 mm.   7. The inferior vena cava is normal in size with greater than 50%  respiratory variability, suggesting right atrial pressure of 3 mmHg.    05/10/21; TTE IMPRESSIONS   1. Left ventricular ejection  fraction, by estimation, is <20%. The left  ventricle has severely decreased function. The left ventricle demonstrates  global hypokinesis. The left ventricular internal cavity size was severely  dilated. Left ventricular  diastolic function could not be evaluated.   2. Right ventricular systolic function is moderately reduced. The right  ventricular size is normal.   3. Left atrial size was moderately dilated.   4. The mitral valve is normal in structure. Trivial mitral valve  regurgitation. No evidence of mitral stenosis.   5. The aortic valve is normal in structure. Aortic valve regurgitation is  trivial. No aortic stenosis is present.   6. Aortic dilatation noted. There is mild dilatation of the ascending  aorta, measuring 40 mm.   7. The inferior vena cava is normal in size with greater than 50%  respiratory variability, suggesting right atrial pressure of 3 mmHg.   2/22//21: TTE IMPRESSIONS   1. Diffuse hypokinesis Endocardium not well visualized no definity used .  Left ventricular ejection fraction, by estimation, is 20 to 25%. The left  ventricle has severely decreased function. The left ventricle has no  regional wall motion abnormalities.  The left ventricular internal cavity size was severely dilated. Left  ventricular diastolic parameters were normal.   2. Right ventricular systolic function is normal. The right ventricular  size is normal.   3. Left atrial size was moderately dilated.   4. The mitral valve is normal in structure and function. Trivial mitral  valve regurgitation. No evidence of mitral stenosis.   5. The aortic valve is tricuspid. Aortic valve regurgitation is trivial.  No aortic stenosis is present.   6. The inferior vena cava is normal in size with greater than 50%  respiratory variability, suggesting right atrial pressure of 3 mmHg.    5.15.2018: R/LHC Left Main  Short, no significant disease.  Left Anterior Descending  No significant coronary  disease.  Left Circumflex  No significant coronary disease.  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 Otherwise no significant disease noted.   RHC Procedural Findings: Hemodynamics (mmHg) RA mean 13 RV 28/8 PA 31/13, mean 20 PCWP mean 12 LV 120/17 AO 121/79  Oxygen saturations: PA 66% AO 100%  Cardiac Output (Fick) 5.53  Cardiac Index (Fick) 2.0  Laboratory Data:  High Sensitivity Troponin:   Recent Labs  Lab 05/09/21 2230 05/10/21 0000 05/14/21 0301 05/14/21 0600  TROPONINIHS 19* 23* 170* 198*     Chemistry Recent Labs  Lab 05/13/21 2326 05/14/21 0227 05/14/21 0301 05/14/21 0333  NA 144 146* 145 148*  K 3.5 3.8 4.1 4.1  CL 102 104 104  --   CO2 31 29 30   --  GLUCOSE 122* 169* 196*  --   BUN 53* 53* 55*  --   CREATININE 1.66* 1.86* 1.80*  --   CALCIUM 9.3 9.4 8.7*  --   GFRNONAA 49* 43* 45*  --   ANIONGAP 11 13 11   --     Recent Labs  Lab 05/09/21 2230 05/14/21 0301 05/14/21 0600  PROT 7.8 8.2* 8.3*  ALBUMIN 3.6 3.2* 3.2*  AST 39 83* 65*  ALT 53* 65* 63*  ALKPHOS 189* 136* 138*  BILITOT 0.9 3.0* 2.2*   Hematology Recent Labs  Lab 05/13/21 0327 05/14/21 0227 05/14/21 0301 05/14/21 0333  WBC 12.6* 20.0* 13.8*  --   RBC 5.34 6.01* 5.57  --   HGB 15.7 18.1* 16.6 16.0  HCT 44.5 51.4 47.7 47.0  MCV 83.3 85.5 85.6  --   MCH 29.4 30.1 29.8  --   MCHC 35.3 35.2 34.8  --   RDW 13.8 14.2 14.2  --   PLT 246 305 280  --    BNP Recent Labs  Lab 05/10/21 0551  BNP 464.3*    DDimer No results for input(s): DDIMER in the last 168 hours.   Radiology/Studies:  DG CHEST PORT 1 VIEW Result Date: 05/14/2021 CLINICAL DATA:  Endotracheal tube EXAM: PORTABLE CHEST 1 VIEW COMPARISON:  Radiograph 05/14/2021 FINDINGS: *Endotracheal tube tip 5.4 cm from the carina. *Right IJ catheter tip at the superior cavoatrial junction. Telemetry leads and pacer pads overlie the chest. Stable cardiomegaly. Diffuse  patchy opacities throughout both lungs, most coalescent in the retrocardiac space. No pneumothorax. No acute osseous or soft tissue abnormality. IMPRESSION: Satisfactory positioning of the lines and tubes, as above. Stable cardiomegaly. Diffuse bilateral patchy opacities, could reflect edema, atelectasis and/or airspace disease. Electronically Signed   By: Lovena Le M.D.   On: 05/14/2021 04:11     DG CHEST PORT 1 VIEW Result Date: 05/13/2021 CLINICAL DATA:  Showed is of breath on ventilation EXAM: PORTABLE CHEST 1 VIEW COMPARISON:  Radiograph 05/11/2021 FINDINGS: Endotracheal tube tip terminates in the mid to upper trachea, 6.8 cm from the carina. Transesophageal tube tip and side port distal to the GE junction. Telemetry leads overlie the chest. Coarse heterogeneous mixed interstitial and airspace opacities are present in both lungs with a mid to lower lung predominance, more focally coalescent consolidation and air bronchograms in the retrocardiac space. Overall extent of disease has significantly improved from most recent comparison prior with particularly improving aeration of the right lung. Cannot exclude small left effusion. No visible layering right effusion. No pneumothorax. Stable cardiomegaly. Remaining cardiomediastinal contours are unremarkable for portable technique. No acute osseous or soft tissue abnormality. IMPRESSION: Improving heterogeneous bilateral airspace opacities, particularly in the right mid to lower lung. Possible left effusion. Lines and tubes as above. Electronically Signed   By: Lovena Le M.D.   On: 05/13/2021 06:40      DG HIP UNILAT WITH PELVIS 2-3 VIEWS LEFT Result Date: 05/13/2021 CLINICAL DATA:  Chronic left hip pain EXAM: DG HIP (WITH OR WITHOUT PELVIS) 2-3V LEFT COMPARISON:  None. FINDINGS: SI joints are non widened. Pubic symphysis and rami appear intact. No fracture or malalignment. IMPRESSION: Negative. Electronically Signed   By: Donavan Foil M.D.   On:  05/13/2021 18:53     Assessment and Plan:   Paroxysmal AFib CHA2DS2Vasc is 4 on xarelto out pt A/c held initially 2/2 hemoptysis > heparin gtt started post code Back in rapid AFib Rebolus amio now (going) Discussed with Dr. Haroldine Laws, discussed dig  though he has a reported intolerance, and I did not give.  2. NICM Longstanding Known noncompliance with meds Initially pt declined Seen by EP service his body habitus felt to be prohibitive as well.  3. VT arrest Initially was PMVT > MMVT  Got some IV lopressor and dig for the AFib, started on amiodarone>> pt intermittently hypotensive Reported noncompliance/kept removing BIPAP eventually refusing it > VT Continue amiodarone as discussed  4. Lethargy 5. Febrile CCM at bedside  Dr. Rayann Heman will see later today Dr. Haroldine Laws will plan to pass bay again as well, if still fast will re-bolus amio    Risk Assessment/Risk Scores:  { For questions or updates, please contact [place] Please consult [email or web address] for contact info under    Signed, Baldwin Jamaica, PA-C  05/14/2021 3:38 PM   I have seen, examined the patient, and reviewed the above assessment and plan.  Changes to above are made where necessary.  On exam, morbidly obese and ill appearing.  He has had refractory afib in the setting of CHF.  Polymorphic VT arrest noted.  His overall prognosis is poor. Currently he is too ill for ICD.  His size also limits our ability to consider EP procedures.  For now, will continue IV amiodarone and medical management of multiple comorbidities.   EP to follow along.    Co Sign: Thompson Grayer, MD 05/14/2021

## 2021-05-14 NOTE — Progress Notes (Signed)
  Patient seen earlier this evening.  Was extubated in the afternoon and soon after developed AF with RVR with rates persistently in the 140s despite amio gtt. Was also febrile to 101.1 and lethargic.   Started back on abx and given several boluses of IV amio.  On my arrival patient awake in bed and interactive. Fully oriented. Clearly stated that he would prefer to avoid re-intubation.   Co-ox 71%  Case discussed with CCM at bedside. Will treat for PNA. Continue to bolus with IV amio as needed. Will give one dose of IV digoxin.   Will not be candidate for ICD until infection cleared.   Add Norepi for BP support as needed. Avoid diltiazem or IV beta-blockers given low EF and recent HF decompensation.    Additional CCT 35 mins   Glori Bickers, MD  10:38 PM

## 2021-05-14 NOTE — Significant Event (Signed)
Called to bedside for V.Fib arrest. Code team documentation  "pulseless VT following A-Fib with RVR s/p extubation. He received 1 shock and 1 round of CPR although achieved ROSC and thereafter became responsive and HDS in sinus tachycardia in the 130's"  Post-CPR patient remained lethargic and unable to protect airway. Intubated. EKG with A.Fib RVR. Given amiodarone bolus and continues on gtt. CXR with Diffuse bilateral patchy opacities. Troponin and BMP pending. Wife updated on events.

## 2021-05-14 NOTE — Progress Notes (Signed)
ANTICOAGULATION CONSULT NOTE - Initial Consult  Pharmacy Consult for Xarelto > Heparin Indication: atrial fibrillation  Allergies  Allergen Reactions   Ace Inhibitors Anaphylaxis and Swelling    Angioedema   Bidil [Isosorb Dinitrate-Hydralazine] Other (See Comments)    headache   Digoxin And Related     Unspecified "side effects"   Buspirone Other (See Comments)    dizziness    Patient Measurements: Height: 5\' 8"  (172.7 cm) Weight: (!) 157.7 kg (347 lb 10.7 oz) IBW/kg (Calculated) : 68.4 Heparin Dosing Weight: 111 kg  Vital Signs: Temp: 98.5 F (36.9 C) (06/14 0743) Temp Source: Axillary (06/14 0743) BP: 101/75 (06/14 0600) Pulse Rate: 142 (06/14 0600)  Labs: Recent Labs    05/13/21 0327 05/13/21 2326 05/14/21 0227 05/14/21 0301 05/14/21 0333 05/14/21 0600  HGB 15.7  --  18.1* 16.6 16.0  --   HCT 44.5  --  51.4 47.7 47.0  --   PLT 246  --  305 280  --   --   CREATININE 1.76* 1.66* 1.86* 1.80*  --   --   TROPONINIHS  --   --   --  170*  --  198*    Estimated Creatinine Clearance: 70.7 mL/min (A) (by C-G formula based on SCr of 1.8 mg/dL (H)).   Medical History: Past Medical History:  Diagnosis Date   Alcohol abuse    Anxiety state, unspecified    Atrial fibrillation (HCC)    CHF (congestive heart failure) (HCC)    Chronic systolic heart failure (HCC)    Diabetes mellitus, type II (HCC)    Edema    Gout    History of medication noncompliance    Migraine    Obesity, unspecified    Obstructive sleep apnea    Psychiatric disorder    Pulmonary embolism (HCC)    Shortness of breath     Assessment: 55 yo male with history of Afib on Xarelto PTA. Presented with SOB and recently extubated on 6/13. Went into VT arrest overnight requiring reintubation. Flipped into Afib with RVR post-arrest. Last dose of Xarelto during admission given 6/12. Pharmacy asked to switch to IV heparin.  CBC wnl, no overt bleeding noted. He is now s/p DCCV back into NSR this  morning. Will check baseline aPTT and HL and plan to dose based on aPTT until levels correlate.  Goal of Therapy:  Heparin level 0.3-0.7 units/ml aPTT 66-102 seconds Monitor platelets by anticoagulation protocol: Yes   Plan:  Stop Xarelto Start Heparin infusion at 1600 units/hr Check 6hr HL, aPTT Monitor daily HL, aPTT, CBC, s/sx bleeding  Richardine Service, PharmD, BCPS PGY2 Cardiology Pharmacy Resident Phone: [phone] 05/14/2021  8:08 AM  Please check [email or web address] for unit-specific pharmacy phone numbers.

## 2021-05-14 NOTE — Progress Notes (Signed)
   05/14/21 0220  Clinical Encounter Type  Visited With Patient not available  Visit Type Code  Referral From Nurse  Consult/Referral To Chaplain   Chaplain responded to code. The patient's family is not available. This chaplain remains available if needed. This note was prepared by Jeanine Luz, M.Div..  For questions please contact by phone [phone].

## 2021-05-14 NOTE — Progress Notes (Addendum)
eLink Physician-Brief Progress Note Patient Name: [name] DOB: Jan 23, 1966 MRN: [id number]   Date of Service  05/14/2021  HPI/Events of Note  ETT 5.5 cm above the carina on post-intubation CXR.   eICU Interventions  Advance ETT by 1.5cm.   For clarification: - Initial CXR showed ETT 6.5cm above the carina. This was advanced by RT and a second CXR was obtained which still shows the ETT about 5.5 cm above the carina. As such, an addition 1.5 cm advancement is indicated.  Intervention Category Minor Interventions: Other:  Charlott Rakes 05/14/2021, 4:16 AM

## 2021-05-14 NOTE — Anesthesia Procedure Notes (Signed)
Procedure Name: Intubation Date/Time: 05/14/2021 2:36 AM Performed by: Jearld Pies, CRNA Pre-anesthesia Checklist: Patient identified, Emergency Drugs available, Suction available and Patient being monitored Patient Re-evaluated:Patient Re-evaluated prior to induction Oxygen Delivery Method: Ambu bag Preoxygenation: Pre-oxygenation with 100% oxygen Induction Type: IV induction, Rapid sequence and Cricoid Pressure applied Laryngoscope Size: Glidescope and 4 Grade View: Grade I Tube type: Oral Tube size: 7.5 mm Number of attempts: 1 Airway Equipment and Method: Stylet, Rigid stylet and Video-laryngoscopy Placement Confirmation: ETT inserted through vocal cords under direct vision, breath sounds checked- equal and bilateral and CO2 detector Secured at: 23 cm Tube secured with: ICU securement device. Dental Injury: Teeth and Oropharynx as per pre-operative assessment

## 2021-05-14 NOTE — Consult Note (Addendum)
Advanced Heart Failure Team Consult Note   Primary Physician: [name], MD PCP-Cardiologist:  Fransico Him, MD [facility]: Dr. Aundra Dubin   Reason for Consultation: Acute on Chronic Systolic Heart Failure   HPI:    [name] is seen today for evaluation of acute on chronic systolic heart failure at the request of Dr. Tamala Julian, Pulmonary and Critical Care Medicine.   [name] is a 55 y.o. male with a past medical history of NICM, EF 25-30% in March [name], felt to be related to prior ETOH abuse. He also has a history of PE 03/2014 completed a years course of Xarelto, morbid obesity, OSA, and tobacco abuse.   He was admitted 11/12/15-11/14/15 with acute on chronic systolic CHF and palpitations. He wore a 30 day event monitor at discharge as he had frequent PVC's and questionable Afib on telemetry. Also with some NSVT, so he was started on Amiodaone, but at follow up had not started taking it. He had previously refused ICD and was seen inpatient by Dr. Rayann Heman who felt that his morbid obesity was a prohibitive factor.   He was seen in the clinic in April [name]. He had started drinking ETOH again. Volume status was stable, he is not an Entresto candidate due to angioedema with lisinopril. Weight was 411 pounds.   Admitted 04/12/17 with SOB, chest pain. D- dimer was 1.16, chest CT without central obstructing PE, however more peripheral and subsegmental pulmonary artery branches were not confidently evaluated due to his body habitus. He was started on a heparin gtt for presumed PE, however VQ scan showed no PE. Troponin was elevated, peaked at 3.37. LHC showed no CAD. Echo showed an EF of 15%, grade 2 DD, no pericarditis. He was diuresed with IV lasix, and started on torsemide 33m at discharge. Discharge weight was 405 pounds.   Admitted 5/22 through 04/24/2018 with abdominal pain, nausea, and vomiting. 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 Did not require surgical intervention.    Echo in 2/21 with EF  20-25%, severe LV dilation.  Presented 05/09/21 w/ SOB 2/2 acute CHF w ? PNA.  CTA showed multifocal ?PNA, no PE. Also in Afib w/ RVR in 150s on admit. Initially required bipap, developed hemoptysis and intubated. Treated w/ IV Lasix. Had improved and was extubated 6/13. Was doing fine most of the day yesterday, then went into Afib w/ RVR. Refused to wear BiPAP. Got worse, developed severe agitation/hypoxia and re-intubated.  Went into Vtach>>VF arrest overnight ACLS>>ROSC after 16m. Developed refractory AFib again this am requiring emergent cardioversion.   Currently in NSR, on amio gtt 60 + heparin. On NE 4. Co-ox 68%. CVP 4. Lactic acid downtrending, 2.4>>1.6 K 4.1, Mg 2.5   Hs trop 19>>22 on admit. Rechecked today 170>>198.   Wife present at bedside. Reports intermittent compliance w/ meds. Has been fully compliant w/ CPAP. Occasionally drinks beer at home, but not heavy, 2-3/week. No recent complaints of CP.   Limited Echo 05/10/21: LVEF < 20%. RV moderately reduced   Review of Systems: [y] = yes, [ ]  = no   General: Weight gain [ ] ; Weight loss [ ] ; Anorexia [ ] ; Fatigue [ ] ; Fever [ ] ; Chills [ ] ; Weakness [ ]   Cardiac: Chest pain/pressure [ ] ; Resting SOB [ Y]; Exertional SOB [ Y]; OrRolla Plate; Pedal Edema [ Y]; Palpitations [ Y]; Syncope [ ] ; Presyncope [ ] ; Paroxysmal nocturnal dyspnea[ ]   Pulmonary: Cough [ ] ; Wheezing[ ] ; Hemoptysis[ ] ; Sputum [ ] ; Snoring [ ]   GI: Vomiting[ ] ; Dysphagia[ ] ; Melena[ ] ; Hematochezia [ ] ; Heartburn[ ] ; Abdominal pain [ ] ; Constipation [ ] ; Diarrhea [ ] ; BRBPR [ ]   GU: Hematuria[ ] ; Dysuria [ ] ; Nocturia[ ]   Vascular: Pain in legs with walking [ ] ; Pain in feet with lying flat [ ] ; Non-healing sores [ ] ; Stroke [ ] ; TIA [ ] ; Slurred speech [ ] ;  Neuro: Headaches[ ] ; Vertigo[ ] ; Seizures[ ] ; Paresthesias[ ] ;Blurred vision [ ] ; Diplopia [ ] ; Vision changes [ ]   Ortho/Skin: Arthritis [ ] ; Joint pain [ ] ; Muscle pain [ ] ; Joint swelling [ ] ; Back Pain [  ]; Rash [ ]   Psych: Depression[ ] ; Anxiety[ ]   Heme: Bleeding problems [ ] ; Clotting disorders [ ] ; Anemia [ ]   Endocrine: Diabetes [ ] ; Thyroid dysfunction[ ]   Home Medications Prior to Admission medications   Medication Sig Start Date End Date Taking? Authorizing Provider  albuterol (VENTOLIN HFA) 108 (90 Base) MCG/ACT inhaler Inhale 1-2 puffs into the lungs every 4 (four) hours as needed for shortness of breath or wheezing. 05/08/21  Yes [provider]  ARIPiprazole (ABILIFY) 10 MG tablet Take 1 tablet (10 mg total) by mouth daily. 03/21/21  Yes Nevada Crane, MD  atorvastatin (LIPITOR) 40 MG tablet Take 1 tablet (40 mg total) by mouth daily. 11/20/20  Yes [name], MD  blood glucose meter kit and supplies KIT Use to check blood sugar. DX E11.9 04/30/18  Yes [name], FNP  clonazePAM (KLONOPIN) 1 MG tablet Take 1 tablet (1 mg total) by mouth 2 (two) times daily as needed for anxiety. 03/21/21  Yes Nevada Crane, MD  FARXIGA 10 MG TABS tablet TAKE 1 TABLET BY MOUTH EVERY DAY Patient taking differently: Take 10 mg by mouth daily. 05/29/20  Yes [name], MD  losartan (COZAAR) 25 MG tablet Take 0.5 tablets (12.5 mg total) by mouth at bedtime. 02/22/21  Yes [name], MD  metoprolol succinate (TOPROL-XL) 100 MG 24 hr tablet TAKE 1 TABLET (100 MG TOTAL) BY MOUTH DAILY. TAKE WITH OR IMMEDIATELY FOLLOWING A MEAL. Patient taking differently: Take 100 mg by mouth. 05/30/20  Yes [name], MD  pantoprazole (PROTONIX) 40 MG tablet TAKE 1 TABLET BY MOUTH EVERY DAY Patient taking differently: Take 40 mg by mouth daily. 04/04/21  Yes [name], MD  potassium chloride SA (KLOR-CON) 20 MEQ tablet Take 40 mEq by mouth daily as needed (cramps).   Yes [provider]  PRESCRIPTION MEDICATION Inhale into the lungs See admin instructions. Bipap, pressure 16/12 with 2L of O2 - use whenever sleeping   Yes [provider]  sertraline (ZOLOFT) 100 MG  tablet Take 1 tablet (100 mg total) by mouth daily. 03/21/21  Yes Nevada Crane, MD  sildenafil (REVATIO) 20 MG tablet Take 4 tablets (80 mg total) by mouth daily as needed. Patient taking differently: Take 80 mg by mouth daily as needed (ed). 03/21/20  Yes [name], MD  spironolactone (ALDACTONE) 25 MG tablet TAKE 1 TABLET (25 MG TOTAL) BY MOUTH DAILY. *MUST KEEP 12/20 APPOINTMENT FOR REFILLS* Patient taking differently: Take 25 mg by mouth daily. 12/10/20  Yes [name], MD  thiamine 100 MG tablet Take 1 tablet (100 mg total) by mouth daily. 11/22/20  Yes [name], MD  torsemide (DEMADEX) 20 MG tablet Take 1-1.5 tablets (20-30 mg total) by mouth daily. 04/18/21  Yes [name], MD  vitamin B-12 (CYANOCOBALAMIN) 1000 MCG tablet Take 1,000 mcg  by mouth daily.   Yes [provider]  XARELTO 20 MG TABS tablet TAKE 1 TABLET BY MOUTH EVERY DAY Patient taking differently: Take 20 mg by mouth daily. 03/27/21  Yes [name], MD  pravastatin (PRAVACHOL) 40 MG tablet Take 40 mg by mouth daily.  02/20/12  [provider]    Past Medical History: Past Medical History:  Diagnosis Date   Alcohol abuse    Anxiety state, unspecified    Atrial fibrillation ([place])    CHF (congestive heart failure) ([place])    Chronic systolic heart failure (HCC)    Diabetes mellitus, type II ([place])    Edema    Gout    History of medication noncompliance    Migraine    Obesity, unspecified    Obstructive sleep apnea    Psychiatric disorder    Pulmonary embolism (HCC)    Shortness of breath     Past Surgical History: Past Surgical History:  Procedure Laterality Date   CARDIAC CATHETERIZATION     CARDIAC CATHETERIZATION N/A 08/13/2015   Procedure: Right/Left Heart Cath and Coronary Angiography;  Surgeon: [name], MD;  Location: [place] [facility];  Service: Cardiovascular;  Laterality: N/A;   RIGHT/LEFT HEART CATH AND CORONARY ANGIOGRAPHY N/A 5/15/[name]   Procedure:  Right/Left Heart Cath and Coronary Angiography;  Surgeon: [name], MD;  Location: [place] [facility];  Service: Cardiovascular;  Laterality: N/A;   TESTICLE SURGERY      Family History: Family History  Problem Relation Age of Onset   Cancer Mother        brain tumor   Hypertension Mother    Diabetes Father        Deceased, 62   Heart disease Maternal Grandmother    Hypertension Other        Family History   Stroke Other        Family History   Diabetes Other        Family History   Diabetes Daughter     Social History: Social History   Socioeconomic History   Marital status: Married    Spouse name: Not on file   Number of children: 3   Years of education: Not on file   Highest education level: Not on file  Occupational History   Occupation: DISABLED  Tobacco Use   Smoking status: Former    Packs/day: 0.50    Years: 30.00    Pack years: 15.00    Types: Cigarettes    Quit date: 05/15/2020    Years since quitting: 0.9   Smokeless tobacco: Never   Tobacco comments:    vaping - nicotine-free products  Vaping Use   Vaping Use: Never used  Substance and Sexual Activity   Alcohol use: Not Currently    Alcohol/week: 0.0 standard drinks   Drug use: No   Sexual activity: Not Currently  Other Topics Concern   Not on file  Social History Narrative   He smokes about a pack per day and he has been smoking since he was 55 years of age.  He drinks alcohol occasionally, but he denies any illicit drug abuse.  He is presently on disability.    Lives with wife in a 2 story home.  Has 2 children.   Previously worked in [employer], last worked in 1998.   Highest level of education:  11th grade           Social Determinants of Radio broadcast assistant  Strain: Low Risk    Difficulty of Paying Living Expenses: Not hard at all  Food Insecurity: No Food Insecurity   Worried About Charity fundraiser in the Last Year: Never true   Ran Out of Food in the Last Year: Never  true  Transportation Needs: No Transportation Needs   Lack of Transportation (Medical): No   Lack of Transportation (Non-Medical): No  Physical Activity: Sufficiently Active   Days of Exercise per Week: 5 days   Minutes of Exercise per Session: 30 min  Stress: No Stress Concern Present   Feeling of Stress : Not at all  Social Connections: Moderately Isolated   Frequency of Communication with Friends and Family: More than three times a week   Frequency of Social Gatherings with Friends and Family: Never   Attends Religious Services: Never   Marine scientist or Organizations: No   Attends Archivist Meetings: Never   Marital Status: Married    Allergies:  Allergies  Allergen Reactions   Ace Inhibitors Anaphylaxis and Swelling    Angioedema   Bidil [Isosorb Dinitrate-Hydralazine] Other (See Comments)    headache   Digoxin And Related     Unspecified "side effects"   Buspirone Other (See Comments)    dizziness    Objective:    Vital Signs:   Temp:  [98.5 F (36.9 C)-100.9 F (38.3 C)] 98.5 F (36.9 C) (06/14 0743) Pulse Rate:  [54-143] 65 (06/14 0845) Resp:  [14-39] 22 (06/14 0845) BP: (71-139)/(19-127) 94/72 (06/14 0845) SpO2:  [91 %-100 %] 100 % (06/14 0845) Arterial Line BP: (112-128)/(61-75) 125/71 (06/13 1830) FiO2 (%):  [40 %-100 %] 60 % (06/14 0800) Weight:  [157.7 kg] 157.7 kg (06/14 0500) Last BM Date:  (pta)  Weight change: Filed Weights   05/12/21 0315 05/13/21 0500 05/14/21 0500  Weight: (!) 172.6 kg (!) 167.8 kg (!) 157.7 kg    Intake/Output:   Intake/Output Summary (Last 24 hours) at 05/14/2021 0901 Last data filed at 05/14/2021 0800 Gross per 24 hour  Intake 2188.7 ml  Output 2349 ml  Net -160.3 ml      Physical Exam    CVP 4 General:  intubated and sedated, Obese. No resp difficulty HEENT: normal Neck: supple. JVP flat . +Rt IJ CVC, Carotids 2+ bilat; no bruits. No lymphadenopathy or thyromegaly appreciated. Cor: PMI  nondisplaced. Regular rate & rhythm. No rubs, gallops or murmurs. Lungs: intubated and course  Abdomen: obese, soft, nontender, nondistended. No hepatosplenomegaly. No bruits or masses. Good bowel sounds. Extremities: no cyanosis, clubbing, rash, edema Neuro: intubated and sedated    Telemetry   Currently NSR 60s.   EKG    AFib w/ RVR 136 bpm LBBB   Labs   Basic Metabolic Panel: Recent Labs  Lab 05/12/21 0307 05/12/21 1611 05/13/21 0327 05/13/21 2326 05/14/21 0227 05/14/21 0301 05/14/21 0333  NA 144 145 143 144 146* 145 148*  K 3.7 4.5 3.6 3.5 3.8 4.1 4.1  CL 111 109 105 102 104 104  --   CO2 24 28 26 31 29 30   --   GLUCOSE 163* 162* 168* 122* 169* 196*  --   BUN 37* 40* 51* 53* 53* 55*  --   CREATININE 1.45* 1.58* 1.76* 1.66* 1.86* 1.80*  --   CALCIUM 8.9 9.2 9.1 9.3 9.4 8.7*  --   MG 2.2 2.1 2.2  --  2.8* 2.5*  --   PHOS  --   --   --   --   --  3.8  --     Liver Function Tests: Recent Labs  Lab 05/09/21 2230 05/14/21 0301 05/14/21 0600  AST 39 83* 65*  ALT 53* 65* 63*  ALKPHOS 189* 136* 138*  BILITOT 0.9 3.0* 2.2*  PROT 7.8 8.2* 8.3*  ALBUMIN 3.6 3.2* 3.2*   No results for input(s): LIPASE, AMYLASE in the last 168 hours. No results for input(s): AMMONIA in the last 168 hours.  CBC: Recent Labs  Lab 05/09/21 2230 05/10/21 0424 05/11/21 0341 05/12/21 0307 05/13/21 0327 05/14/21 0227 05/14/21 0301 05/14/21 0333  WBC 10.1  --  21.7* 19.7* 12.6* 20.0* 13.8*  --   NEUTROABS 3.9  --   --   --   --   --  8.2*  --   HGB 15.8   < > 15.4 14.5 15.7 18.1* 16.6 16.0  HCT 45.3   < > 44.4 39.9 44.5 51.4 47.7 47.0  MCV 84.7  --  84.3 82.4 83.3 85.5 85.6  --   PLT 302  --  301 264 246 305 280  --    < > = values in this interval not displayed.    Cardiac Enzymes: No results for input(s): CKTOTAL, CKMB, CKMBINDEX, TROPONINI in the last 168 hours.  BNP: BNP (last 3 results) Recent Labs    05/10/21 0551  BNP 464.3*    ProBNP (last 3 results) No  results for input(s): PROBNP in the last 8760 hours.   CBG: Recent Labs  Lab 05/13/21 1506 05/13/21 2146 05/14/21 0025 05/14/21 0223 05/14/21 0716  GLUCAP 142* 148* 142* 183* 132*    Coagulation Studies: No results for input(s): LABPROT, INR in the last 72 hours.   Imaging   DG CHEST PORT 1 VIEW  Result Date: 05/14/2021 CLINICAL DATA:  Endotracheal tube EXAM: PORTABLE CHEST 1 VIEW COMPARISON:  Radiograph 05/14/2021 FINDINGS: *Endotracheal tube tip 5.4 cm from the carina. *Right IJ catheter tip at the superior cavoatrial junction. Telemetry leads and pacer pads overlie the chest. Stable cardiomegaly. Diffuse patchy opacities throughout both lungs, most coalescent in the retrocardiac space. No pneumothorax. No acute osseous or soft tissue abnormality. IMPRESSION: Satisfactory positioning of the lines and tubes, as above. Stable cardiomegaly. Diffuse bilateral patchy opacities, could reflect edema, atelectasis and/or airspace disease. Electronically Signed   By: Lovena Le M.D.   On: 05/14/2021 04:11   DG CHEST PORT 1 VIEW  Result Date: 05/14/2021 CLINICAL DATA:  Intubation EXAM: PORTABLE CHEST 1 VIEW COMPARISON:  05/13/2021 FINDINGS: Support Apparatus: --Endotracheal tube: Tip at the level of the clavicular heads. --Enteric tube:No longer present --Vascular catheter(s):Right internal jugular vein approach central venous catheter tip is at the cavoatrial junction. --Other: None Moderate cardiomegaly with unchanged left retrocardiac opacities. No pneumothorax. IMPRESSION: 1. Endotracheal tube tip at the level of the clavicular heads. Enteric tube no longer present. 2. Right IJ approach central venous catheter with tip at the cavoatrial junction. No pneumothorax. 3. Unchanged left retrocardiac opacities. Electronically Signed   By: Ulyses Jarred M.D.   On: 05/14/2021 03:00   DG HIP UNILAT WITH PELVIS 2-3 VIEWS LEFT  Result Date: 05/13/2021 CLINICAL DATA:  Chronic left hip pain EXAM: DG HIP  (WITH OR WITHOUT PELVIS) 2-3V LEFT COMPARISON:  None. FINDINGS: SI joints are non widened. Pubic symphysis and rami appear intact. No fracture or malalignment. IMPRESSION: Negative. Electronically Signed   By: Donavan Foil M.D.   On: 05/13/2021 18:53     Medications:     Current Medications:  budesonide (PULMICORT) nebulizer  solution  0.5 mg Nebulization BID   chlorhexidine gluconate (MEDLINE KIT)  15 mL Mouth Rinse BID   Chlorhexidine Gluconate Cloth  6 each Topical Daily   docusate  100 mg Per Tube BID   gabapentin  100 mg Oral TID   insulin aspart  0-20 Units Subcutaneous Q4H   ipratropium-albuterol  3 mL Nebulization Q4H   ketamine HCl       pantoprazole sodium  40 mg Per Tube QHS   polyethylene glycol  17 g Per Tube Daily   rocuronium bromide       sertraline  100 mg Oral Daily   sodium chloride flush  10-40 mL Intracatheter Q12H    Infusions:  amiodarone 30 mg/hr (05/14/21 0800)   fentaNYL infusion INTRAVENOUS 50 mcg/hr (05/14/21 0846)   heparin 1,600 Units/hr (05/14/21 0819)   magnesium sulfate bolus IVPB 2 g (05/14/21 0802)   norepinephrine (LEVOPHED) Adult infusion 5 mcg/min (05/14/21 0800)   propofol (DIPRIVAN) infusion 40 mcg/kg/min (05/14/21 0851)      Assessment/Plan   Acute on Chronic Systolic Heart Failure: Nonischemic cardiomyopathy. ?If ETOH has played a role. Heavy drinker in the past (now drinks rarely). Echo (3/18) with EF 25-30%.  [name] showed no CAD. Echo 7/19 and in 2/21 with EF 20-25%. Now admitted w/ a/c CHF in setting of Afib w/ RVR, HR 150s on admit. Duration unknown. Developed pulmonary edema requiring intubation. VT/VF arrest 6/13 requiring defib + refractory Afib w/ RVR 6/14 requiring emergent DCCV. Limited echo this admit w/ LVEF <20%, moderately reduced RV. Co-ox 68% on NE 4. Has diuresed well. Net I/Os -6L. CVP 4   - hold IV diuretics for now w/ low CVP   - need to maintain NSR. Continue amio gtt  - wean NE as tolerated   - hold losartan and  spiro for now w/ AKI and soft BP requiring NE. Resume once stable (no Entresto w/ h/o angioedema w/ lisinopril)   - hold ? blocker for now   - will need to reconsider ICD. Previously declined. ? If BMI is prohibitive. QRS not wide enough for CRT.  2. Afib: RVR on admit, 150s. Duration unknown. Required emergent DCCV 6/14. Currently maintaining NSR on amio gtt.   - continue amio gtt at 60 hr  - continue heparin gtt  - keep K > 4.0 and Mg > 2.0   3. VT/VF Arrest:   - 6/13. ACLS w/ ROSC after 1 min   - continue amio gtt  - keep K > 4.0 and Mg > 2.0   - LHC [name] showed no CAD. HS trop 170>>198, likely demand ischemia   - Limited Echo this admit EF < 20%, RV moderately reduced. Complete 2D echo pending   - Reconsider ICD (see above)  4. Acute Hypoxic Respiratory Failure:   - intubated, vent management per PCCM   - multifactorial from CHF/pulmonary edema + ? PNA. CTA negative for PE   - appears well diuresed, CVP low at 4. Hold diuretics for now  - defer abx to PCCM   5. AKI on Stage II CKD  - Baseline SCr ~1.0  - 1.4 on admit, bumped to 2.3 this admit, improved but up trending again, 1.8 today   - hemodynamics ok w/ Co-ox at 68%  - hold diuretics today w/ low CVP and follow co-ox    6. H/o PE: Diagnosed by V/Q scan in 2019. CTA  this admit negative for PE  - continue heparin gtt for now  -  transition back to [place] once stable    Length of Stay: [address], PA-C  05/14/2021, 9:01 AM  Advanced Heart Failure Team Pager [phone] (M-F; 7a - 5p)  Please contact [facility] for night-coverage after hours (4p -7a ) and weekends on [email or web address]   Agree with above.   55 y/o with morbid obesity severe systolic HF due to NICM admitted with respiratory failure in the setting of volume overload and noncompliance with full diuretic regimen and AF. Was diuresed and extubated yesterday which he tolerated well then developed recurrent AF and VT and was reintubated and shocked.   Now  intubated. On IV amio and NE at 4. Co-ox 68% CVP 4. In NSR  Chest CT reviewed and looks more like HF to me than PNA. PCT 0.3  Echo EF < 20% severe RV dysfunction.   General:  Obese male sedated on vent HEENT: normal Neck: supple. RIJ TLC Carotids 2+ bilat; no bruits. No lymphadenopathy or thryomegaly appreciated. Cor: PMI nondisplaced. Regular rate & rhythm. No rubs, gallops or murmurs. Lungs: coarse Abdomen: obese soft, nontender, nondistended. No hepatosplenomegaly. No bruits or masses. Good bowel sounds. Extremities: no cyanosis, clubbing, rash, edema Neuro: intubated/sedated  Suspect initial insult was HF in setting of AF and not taking diuretics properly. Responded to diuresis but had to be reintubated in setting of VT. Hs trop only minimally elevated so doubt ischemic.   Volume status and x ok now. Would aim for repeat extubation today (d/w CMM) with amio on board. Follow CVP and co-ox. Have consulted EP for ICD. Has IVCD but doubt would respond to CRT.   May benefit from cardiomems eventually. Not candidate for advanced therapies currently due to size and RV dysfunction.   CRITICAL CARE Performed by: Glori Bickers  Total critical care time: 55 minutes  Critical care time was exclusive of separately billable procedures and treating other patients.  Critical care was necessary to treat or prevent imminent or life-threatening deterioration.  Critical care was time spent personally by me (independent of midlevel providers or residents) on the following activities: development of treatment plan with patient and/or surrogate as well as nursing, discussions with consultants, evaluation of patient's response to treatment, examination of patient, obtaining history from patient or surrogate, ordering and performing treatments and interventions, ordering and review of laboratory studies, ordering and review of radiographic studies, pulse oximetry and re-evaluation of patient's  condition.  Glori Bickers, MD  11:15 AM

## 2021-05-14 NOTE — Progress Notes (Signed)
OT Cancellation Note  Patient Details Name: [name] MRN: [id number] DOB: [birth year]   Cancelled Treatment:    Reason Eval/Treat Not Completed: Patient not medically ready. Per chart review, patient coded s/p intubation and sedation since time of OT order placement. OT to sign off at this time. Will await new orders if appropriate.  Gloris Manchester OTR/L Supplemental OT, Department of rehab services [phone]    [name] R H. 05/14/2021, 8:14 AM

## 2021-05-15 ENCOUNTER — Inpatient Hospital Stay (HOSPITAL_COMMUNITY): Payer: Medicare HMO

## 2021-05-15 DIAGNOSIS — M7989 Other specified soft tissue disorders: Secondary | ICD-10-CM | POA: Diagnosis not present

## 2021-05-15 DIAGNOSIS — R609 Edema, unspecified: Secondary | ICD-10-CM | POA: Diagnosis not present

## 2021-05-15 DIAGNOSIS — J9601 Acute respiratory failure with hypoxia: Principal | ICD-10-CM

## 2021-05-15 LAB — CBC
HCT: 47.9 % (ref 39.0–52.0)
HCT: 48.1 % (ref 39.0–52.0)
Hemoglobin: 16.4 g/dL (ref 13.0–17.0)
Hemoglobin: 16.6 g/dL (ref 13.0–17.0)
MCH: 29.3 pg (ref 26.0–34.0)
MCH: 29.5 pg (ref 26.0–34.0)
MCHC: 34.2 g/dL (ref 30.0–36.0)
MCHC: 34.5 g/dL (ref 30.0–36.0)
MCV: 84.8 fL (ref 80.0–100.0)
MCV: 86.3 fL (ref 80.0–100.0)
Platelets: 231 10*3/uL (ref 150–400)
Platelets: 264 10*3/uL (ref 150–400)
RBC: 5.55 MIL/uL (ref 4.22–5.81)
RBC: 5.67 MIL/uL (ref 4.22–5.81)
RDW: 14 % (ref 11.5–15.5)
RDW: 14.2 % (ref 11.5–15.5)
WBC: 12.1 10*3/uL — ABNORMAL HIGH (ref 4.0–10.5)
WBC: 12.4 10*3/uL — ABNORMAL HIGH (ref 4.0–10.5)
nRBC: 0 % (ref 0.0–0.2)
nRBC: 0 % (ref 0.0–0.2)

## 2021-05-15 LAB — BASIC METABOLIC PANEL
Anion gap: 13 (ref 5–15)
BUN: 47 mg/dL — ABNORMAL HIGH (ref 6–20)
CO2: 28 mmol/L (ref 22–32)
Calcium: 8.7 mg/dL — ABNORMAL LOW (ref 8.9–10.3)
Chloride: 106 mmol/L (ref 98–111)
Creatinine, Ser: 1.55 mg/dL — ABNORMAL HIGH (ref 0.61–1.24)
GFR, Estimated: 54 mL/min — ABNORMAL LOW (ref >60)
Glucose, Bld: 143 mg/dL — ABNORMAL HIGH (ref 70–99)
Potassium: 3.8 mmol/L (ref 3.5–5.1)
Sodium: 147 mmol/L — ABNORMAL HIGH (ref 135–145)

## 2021-05-15 LAB — PHOSPHORUS: Phosphorus: 3.1 mg/dL (ref 2.5–4.6)

## 2021-05-15 LAB — COOXEMETRY PANEL
Carboxyhemoglobin: 1 % (ref 0.5–1.5)
Methemoglobin: 1 % (ref 0.0–1.5)
O2 Saturation: 63.4 %
Total hemoglobin: 16.7 g/dL — ABNORMAL HIGH (ref 12.0–16.0)

## 2021-05-15 LAB — CALCIUM, IONIZED: Calcium, Ionized, Serum: 4.6 mg/dL (ref 4.5–5.6)

## 2021-05-15 LAB — GLUCOSE, CAPILLARY
Glucose-Capillary: 141 mg/dL — ABNORMAL HIGH (ref 70–99)
Glucose-Capillary: 164 mg/dL — ABNORMAL HIGH (ref 70–99)
Glucose-Capillary: 213 mg/dL — ABNORMAL HIGH (ref 70–99)
Glucose-Capillary: 127 mg/dL — ABNORMAL HIGH (ref 70–99)
Glucose-Capillary: 165 mg/dL — ABNORMAL HIGH (ref 70–99)
Glucose-Capillary: 147 mg/dL — ABNORMAL HIGH (ref 70–99)

## 2021-05-15 LAB — HEPARIN LEVEL (UNFRACTIONATED): Heparin Unfractionated: 0.56 IU/mL (ref 0.30–0.70)

## 2021-05-15 LAB — MAGNESIUM: Magnesium: 3.1 mg/dL — ABNORMAL HIGH (ref 1.7–2.4)

## 2021-05-15 LAB — APTT
aPTT: 45 seconds — ABNORMAL HIGH (ref 24–36)
aPTT: 57 seconds — ABNORMAL HIGH (ref 24–36)

## 2021-05-15 MED ORDER — IPRATROPIUM-ALBUTEROL 0.5-2.5 (3) MG/3ML IN SOLN: 3.0000 mL | RESPIRATORY_TRACT | Status: DC | PRN

## 2021-05-15 MED ORDER — GABAPENTIN 100 MG PO CAPS
100.0000 mg | ORAL_CAPSULE | Freq: Three times a day (TID) | ORAL | Status: DC
  Administered 2021-05-15 – 2021-05-19 (×12): 100 mg via ORAL
  Filled 2021-05-15 (×13): qty 1

## 2021-05-15 MED ORDER — POTASSIUM CHLORIDE 10 MEQ/50ML IV SOLN
10.0000 meq | INTRAVENOUS | Status: AC
  Administered 2021-05-15 (×4): 10 meq via INTRAVENOUS
  Filled 2021-05-15 (×2): qty 50

## 2021-05-15 MED ORDER — ARIPIPRAZOLE 10 MG PO TABS
10.0000 mg | ORAL_TABLET | Freq: Every day | ORAL | Status: DC
  Administered 2021-05-15 – 2021-05-19 (×5): 10 mg via ORAL
  Filled 2021-05-15 (×5): qty 1

## 2021-05-15 MED ORDER — METOPROLOL SUCCINATE ER 50 MG PO TB24
25.0000 mg | ORAL_TABLET | Freq: Every day | ORAL | Status: DC
  Administered 2021-05-15 – 2021-05-16 (×2): 25 mg via ORAL
  Filled 2021-05-15 (×2): qty 1

## 2021-05-15 MED ORDER — POLYETHYLENE GLYCOL 3350 17 G PO PACK
17.0000 g | PACK | Freq: Every day | ORAL | Status: DC
  Administered 2021-05-16 – 2021-05-19 (×4): 17 g via ORAL
  Filled 2021-05-15 (×5): qty 1

## 2021-05-15 MED ORDER — ACETAMINOPHEN 325 MG PO TABS
650.0000 mg | ORAL_TABLET | ORAL | Status: DC | PRN
  Administered 2021-05-17 – 2021-05-18 (×2): 650 mg via ORAL
  Filled 2021-05-15 (×2): qty 2

## 2021-05-15 MED ORDER — ATORVASTATIN CALCIUM 40 MG PO TABS
40.0000 mg | ORAL_TABLET | Freq: Every day | ORAL | Status: DC
  Administered 2021-05-15 – 2021-05-19 (×5): 40 mg via ORAL
  Filled 2021-05-15 (×5): qty 1

## 2021-05-15 MED ORDER — CLONAZEPAM 1 MG PO TABS
1.0000 mg | ORAL_TABLET | Freq: Two times a day (BID) | ORAL | Status: DC | PRN
  Administered 2021-05-16: 1 mg via ORAL
  Filled 2021-05-15: qty 1

## 2021-05-15 MED ORDER — CHLORHEXIDINE GLUCONATE 0.12 % MT SOLN
15.0000 mL | Freq: Two times a day (BID) | OROMUCOSAL | Status: DC
  Administered 2021-05-16 – 2021-05-19 (×7): 15 mL via OROMUCOSAL
  Filled 2021-05-15 (×6): qty 15

## 2021-05-15 MED ORDER — DOCUSATE SODIUM 100 MG PO CAPS
100.0000 mg | ORAL_CAPSULE | Freq: Two times a day (BID) | ORAL | Status: DC
  Administered 2021-05-15 – 2021-05-19 (×8): 100 mg via ORAL
  Filled 2021-05-15 (×10): qty 1

## 2021-05-15 MED ORDER — VITAMIN B-12 1000 MCG PO TABS
1000.0000 ug | ORAL_TABLET | Freq: Every day | ORAL | Status: DC
  Administered 2021-05-15 – 2021-05-19 (×5): 1000 ug via ORAL
  Filled 2021-05-15 (×5): qty 1

## 2021-05-15 MED ORDER — SERTRALINE HCL 50 MG PO TABS
100.0000 mg | ORAL_TABLET | Freq: Every day | ORAL | Status: DC
  Administered 2021-05-16 – 2021-05-19 (×4): 100 mg via ORAL
  Filled 2021-05-15 (×5): qty 2

## 2021-05-15 MED ORDER — ENSURE ENLIVE PO LIQD
237.0000 mL | Freq: Three times a day (TID) | ORAL | Status: DC
  Administered 2021-05-15 – 2021-05-19 (×10): 237 mL via ORAL

## 2021-05-15 MED ORDER — THIAMINE HCL 100 MG PO TABS
100.0000 mg | ORAL_TABLET | Freq: Every day | ORAL | Status: DC
  Administered 2021-05-15 – 2021-05-19 (×5): 100 mg via ORAL
  Filled 2021-05-15 (×5): qty 1

## 2021-05-15 MED ORDER — DIGOXIN 125 MCG PO TABS
0.1250 mg | ORAL_TABLET | Freq: Every day | ORAL | Status: DC
  Administered 2021-05-15 – 2021-05-18 (×4): 0.125 mg via ORAL
  Filled 2021-05-15 (×5): qty 1

## 2021-05-15 MED ORDER — OXYCODONE-ACETAMINOPHEN 5-325 MG PO TABS: 1.0000 | ORAL_TABLET | ORAL | Status: DC | PRN

## 2021-05-15 MED ORDER — ORAL CARE MOUTH RINSE
15.0000 mL | Freq: Two times a day (BID) | OROMUCOSAL | Status: DC
  Administered 2021-05-15 – 2021-05-18 (×7): 15 mL via OROMUCOSAL

## 2021-05-15 NOTE — H&P (View-Only) (Signed)
Advanced Heart Failure Rounding Note  PCP-Cardiologist: Fransico Him, MD   Subjective:   Over the weekend diuresed with IV lasix.  6/10 Intubated. Diuresed.  6/13 Extubated 6/14 A fib RVR- VT arrest . Urgent cardioversion. Extubated.  Placed on amio drip.   Received IV metoprolol this morning.   Complaining of dizziness.  Objective:   Weight Range: (!) 161.7 kg Body mass index is 54.2 kg/m.   Vital Signs:   Temp:  [98 F (36.7 C)-101.1 F (38.4 C)] 98 F (36.7 C) (06/15 0338) Pulse Rate:  [61-140] 96 (06/15 0900) Resp:  [12-34] 19 (06/15 0900) BP: (81-154)/(34-138) 121/63 (06/15 0730) SpO2:  [88 %-100 %] 95 % (06/15 0900) FiO2 (%):  [30 %-50 %] 30 % (06/15 0312) Weight:  [161.7 kg] 161.7 kg (06/15 0400) Last BM Date:  (pta)  Weight change: Filed Weights   05/13/21 0500 05/14/21 0500 05/15/21 0400  Weight: (!) 167.8 kg (!) 157.7 kg (!) 161.7 kg    Intake/Output:   Intake/Output Summary (Last 24 hours) at 05/15/2021 0901 Last data filed at 05/15/2021 0700 Gross per 24 hour  Intake 1692.44 ml  Output 1525 ml  Net 167.44 ml      Physical Exam   CVP 3  General:   No resp difficulty HEENT: Normal Neck: Supple. JVP .difficult to assess  Carotids 2+ bilat; no bruits. No lymphadenopathy or thyromegaly appreciated. Cor: PMI nondisplaced. Tachy Irregular rate & rhythm. No rubs, gallops or murmurs. Lungs: Clear Abdomen: obese, Soft, nontender, nondistended. No hepatosplenomegaly. No bruits or masses. Good bowel sounds. Extremities: No cyanosis, clubbing, rash, edema Neuro: Alert & orientedx3, cranial nerves grossly intact. moves all 4 extremities w/o difficulty. Affect pleasant   Telemetry   A fib RVR 120-140s  EKG  N/a  Labs    CBC Recent Labs    05/14/21 0301 05/14/21 0333 05/14/21 1648 05/14/21 1704 05/15/21 0436  WBC 13.8*  --  13.6*  --  12.4*  NEUTROABS 8.2*  --   --   --   --   HGB 16.6   < > 16.3 16.0 16.4  HCT 47.7   < > 46.7 47.0 47.9   MCV 85.6  --  85.5  --  86.3  PLT 280  --  242  --  231   < > = values in this interval not displayed.   Basic Metabolic Panel Recent Labs    05/14/21 1648 05/14/21 1704 05/15/21 0436  NA 144 147* 147*  K 3.6 3.7 3.8  CL 102  --  106  CO2 29  --  28  GLUCOSE 192*  --  143*  BUN 54*  --  47*  CREATININE 1.81*  --  1.55*  CALCIUM 8.9  --  8.7*  MG 3.1*  --  3.1*  PHOS 3.3  --  3.1   Liver Function Tests Recent Labs    05/14/21 0301 05/14/21 0600  AST 83* 65*  ALT 65* 63*  ALKPHOS 136* 138*  BILITOT 3.0* 2.2*  PROT 8.2* 8.3*  ALBUMIN 3.2* 3.2*   No results for input(s): LIPASE, AMYLASE in the last 72 hours. Cardiac Enzymes No results for input(s): CKTOTAL, CKMB, CKMBINDEX, TROPONINI in the last 72 hours.  BNP: BNP (last 3 results) Recent Labs    05/10/21 0551  BNP 464.3*    ProBNP (last 3 results) No results for input(s): PROBNP in the last 8760 hours.   D-Dimer No results for input(s): DDIMER in the last 72 hours.  Hemoglobin A1C No results for input(s): HGBA1C in the last 72 hours. Fasting Lipid Panel Recent Labs    05/14/21 0226  TRIG 221*   Thyroid Function Tests No results for input(s): TSH, T4TOTAL, T3FREE, THYROIDAB in the last 72 hours.  Invalid input(s): FREET3  Other results:   Imaging    DG Abd Portable 1V  Result Date: 05/14/2021 CLINICAL DATA:  Check gastric tube placement EXAM: PORTABLE ABDOMEN - 1 VIEW COMPARISON:  None. FINDINGS: Gastric catheter is noted with the tip in the stomach. Proximal side port lies in the distal esophagus. This should be advanced several cm deeper into the stomach. IMPRESSION: Gastric catheter as described. This should be advanced deeper into the stomach. Electronically Signed   By: Inez Catalina M.D.   On: 05/14/2021 09:29   ECHOCARDIOGRAM COMPLETE  Result Date: 05/14/2021    ECHOCARDIOGRAM REPORT   Patient Name:   [name] Date of Exam: 05/14/2021 Medical Rec #:  [id number]      Height:       68.0  in Accession #:    [id number]     Weight:       347.7 lb Date of Birth:  09-Jan-1966     BSA:          2.585 m Patient Age:    55 years       BP:           108/76 mmHg Patient Gender: M              HR:           67 bpm. Exam Location:  Inpatient Procedure: 2D Echo, Color Doppler, Cardiac Doppler and Intracardiac            Opacification Agent Indications:    Cardiac arrest  History:        Patient has prior history of Echocardiogram examinations, most                 recent 01/23/2020. CHF, Arrythmias:Atrial Fibrillation; Risk                 Factors:Diabetes.  Sonographer:    Maudry Mayhew MHA, RDMS, RVT, RDCS Referring Phys: [id number] Omar Person  Sonographer Comments: Patient is morbidly obese and echo performed with patient supine and on artificial respirator. IMPRESSIONS  1. Left ventricular ejection fraction, by estimation, is <20%. The left ventricle has severely decreased function. The left ventricle demonstrates global hypokinesis. The left ventricular internal cavity size was severely dilated. Left ventricular diastolic parameters are indeterminate.  2. Right ventricular systolic function is normal. The right ventricular size is normal.  3. Left atrial size was mildly dilated.  4. The mitral valve is normal in structure. Trivial mitral valve regurgitation. No evidence of mitral stenosis.  5. The aortic valve has an indeterminant number of cusps. Aortic valve regurgitation is not visualized. No aortic stenosis is present.  6. Aortic dilatation noted. There is mild dilatation of the ascending aorta, measuring 39 mm.  7. The inferior vena cava is normal in size with greater than 50% respiratory variability, suggesting right atrial pressure of 3 mmHg. FINDINGS  Left Ventricle: Left ventricular ejection fraction, by estimation, is <20%. The left ventricle has severely decreased function. The left ventricle demonstrates global hypokinesis. Definity contrast agent was given IV to delineate the left  ventricular endocardial borders. The left ventricular internal cavity size was severely dilated. There is no left ventricular hypertrophy. Left ventricular diastolic parameters are indeterminate. Right  Ventricle: The right ventricular size is normal. Right ventricular systolic function is normal. Left Atrium: Left atrial size was mildly dilated. Right Atrium: Right atrial size was normal in size. Pericardium: There is no evidence of pericardial effusion. Mitral Valve: The mitral valve is normal in structure. Trivial mitral valve regurgitation. No evidence of mitral valve stenosis. Tricuspid Valve: The tricuspid valve is normal in structure. Tricuspid valve regurgitation is not demonstrated. No evidence of tricuspid stenosis. Aortic Valve: The aortic valve has an indeterminant number of cusps. Aortic valve regurgitation is not visualized. No aortic stenosis is present. Aortic valve mean gradient measures 4.0 mmHg. Aortic valve peak gradient measures 7.7 mmHg. Pulmonic Valve: The pulmonic valve was normal in structure. Pulmonic valve regurgitation is not visualized. No evidence of pulmonic stenosis. Aorta: Aortic dilatation noted. There is mild dilatation of the ascending aorta, measuring 39 mm. Venous: The inferior vena cava is normal in size with greater than 50% respiratory variability, suggesting right atrial pressure of 3 mmHg.  LEFT VENTRICLE PLAX 2D LVIDd:         8.50 cm  Diastology LVIDs:         7.50 cm  LV e' lateral:   7.78 cm/s LV PW:         0.60 cm  LV E/e' lateral: 14.0 LV IVS:        0.40 cm LVOT diam:     2.40 cm LV SV:         29 LV SV Index:   11 LVOT Area:     4.52 cm  LEFT ATRIUM              Index LA diam:        4.70 cm  1.82 cm/m LA Vol (A2C):   180.0 ml 69.63 ml/m LA Vol (A4C):   93.8 ml  36.28 ml/m LA Biplane Vol: 130.0 ml 50.29 ml/m  AORTIC VALVE AV Area (Vmax):    1.22 cm AV Area (Vmean):   1.39 cm AV Area (VTI):     1.25 cm AV Vmax:           139.00 cm/s AV Vmean:          92.700  cm/s AV VTI:            0.231 m AV Peak Grad:      7.7 mmHg AV Mean Grad:      4.0 mmHg LVOT Vmax:         37.50 cm/s LVOT Vmean:        28.400 cm/s LVOT VTI:          0.064 m LVOT/AV VTI ratio: 0.28  AORTA Ao Root diam: 3.10 cm MITRAL VALVE MV Area (PHT): 6.32 cm     SHUNTS MV Decel Time: 120 msec     Systemic VTI:  0.06 m MR Peak grad: 16.0 mmHg     Systemic Diam: 2.40 cm MR Vmax:      200.00 cm/s MV E velocity: 109.00 cm/s MV A velocity: 27.20 cm/s MV E/A ratio:  4.01 Kirk Ruths MD Electronically signed by Kirk Ruths MD Signature Date/Time: 05/14/2021/1:36:58 PM    Final      Medications:     Scheduled Medications:  budesonide (PULMICORT) nebulizer solution  0.5 mg Nebulization BID   chlorhexidine gluconate (MEDLINE KIT)  15 mL Mouth Rinse BID   Chlorhexidine Gluconate Cloth  6 each Topical Daily   gabapentin  100 mg Per Tube Q8H   insulin aspart  0-20 Units Subcutaneous Q4H   metoprolol succinate  25 mg Oral Daily   pantoprazole sodium  40 mg Per Tube QHS   polyethylene glycol  17 g Per Tube Daily   sertraline  100 mg Per Tube Daily   sodium chloride flush  10-40 mL Intracatheter Q12H    Infusions:  sodium chloride     amiodarone 60 mg/hr (05/15/21 0700)   cefTRIAXone (ROCEPHIN)  IV Stopped (05/14/21 1813)   heparin 2,400 Units/hr (05/15/21 0700)   norepinephrine (LEVOPHED) Adult infusion 2 mcg/min (05/14/21 1500)   potassium chloride      PRN Medications: sodium chloride, acetaminophen, clonazePAM, docusate, ipratropium-albuterol, ondansetron (ZOFRAN) IV, oxyCODONE-acetaminophen, polyethylene glycol, sodium chloride flush   Assessment/Plan  Acute on Chronic Systolic Heart Failure: Nonischemic cardiomyopathy. ?If ETOH has played a role. Heavy drinker in the past (now drinks rarely). Echo (3/18) with EF 25-30%.  [place] 2018 showed no CAD. Echo 7/19 and in 2/21 with EF 20-25%. Now admitted w/ a/c CHF in setting of Afib w/ RVR, HR 150s on admit. Duration unknown. Developed  pulmonary edema requiring intubation. VT/VF arrest 6/13 requiring defib + refractory Afib w/ RVR 6/14 requiring emergent DCCV. Limited echo this admit w/ LVEF <20%, moderately reduced RV.  Check CO-OX. CVP 3. Hold diuretics.  - Continue amio for A Fib RVR.              - hold losartan and spiro for now w/ AKI . (no Entresto h/o angioedema w/ lisinopril)  -Intolerant bidil due to headaches.              - Add Toprol XL 25 mg daily.               2. Afib: RVR on admit, 150s. Duration unknown. Required emergent DCCV 6/14. Back in  A fib after extubation.  - Remains in A fib RVR today. Add 25 mg Toprol XL daily. .             - continue amio gtt at 60 hr             - continue heparin gtt             - keep K > 4.0 and Mg > 2.0  - Plan TEE/DC-CV tomorrow. He has missed doses of xarelto. Discussed with CCM.    3. VT/VF Arrest:             - 6/13. ACLS w/ ROSC after 1 min             - continue amio gtt. Add              - keep K > 4.0 and Mg > 2.0             - [facility] 2018 showed no CAD. HS trop 170>>198, likely demand ischemia             - EP following. Once infection clear will need to consider ICD    4. Acute Hypoxic Respiratory Failure             - intubated--> extubated 6/14              - multifactorial from CHF/pulmonary edema + ? PNA. CTA negative for PE -On ceftriaxone                5. AKI on Stage II CKD             - Baseline SCr ~1.0             -  1.4 on admit, bumped to 2.3 this admit, improved but up trending again, 1.5 today                          6. H/o PE: Diagnosed by V/Q scan in 2019. CTA  this admit negative for PE             - continue heparin gtt for now             - transition back to DOAC once stable  7.  ID 6/14 Fever Started on ceftriaxone.   8. Medication Noncompliance.  He has been taking medications intermittently.   Length of Stay: [place], NP  05/15/2021, 9:01 AM  Advanced Heart Failure Team Pager [phone] (M-F; 7a - 5p)  Please  contact [facility] for night-coverage after hours (5p -7a ) and weekends on [email or web address]  Patient seen with NP, agree with the above note.    He remains in AF, currently 120s-130s.  BP stable, not on pressors.  CVP 3.  Creatinine lower at 1.55.  He is on heparin gtt and amiodarone 60 mg/hr.  Co-ox 63% today.    CXR with diffuse bilateral infiltrates, Tm 101.1.  He is on ceftriaxone for PNA coverage.    General: NAD Neck: Thick. No JVD, no thyromegaly or thyroid nodule.  Lungs: Clear to auscultation bilaterally with normal respiratory effort. CV: Nondisplaced PMI.  Heart Tachy, irregular S1/S2, no S3/S4, no murmur.  No peripheral edema.   Abdomen: Soft, nontender, no hepatosplenomegaly, no distention.  Skin: Intact without lesions or rashes.  Neurologic: Alert and oriented x 3.  Psych: Normal affect. Extremities: No clubbing or cyanosis.  HEENT: Normal.   1. Atrial fibrillation: With RVR.  Patient has history of PAF. He was admitted with AF/RVR, this may have triggered CHF exacerbation, ?tachy-mediated.  HR still elevated currently on amiodarone 60 mg/hr.  - Continue amiodarone gtt 60 mg/hr.   - Continue heparin gtt.  - Add Toprol XL 25 daily.  - Add digoxin.  - Will need to get him back into NSR.  He reports missing Xarelto doses prior to admission.  Plan TEE-guided DCCV tomorrow if he remains in AF.  2.  Acute on chronic systolic CHF: Nonischemic cardiomyopathy. ?If ETOH has played a role (now drinks rarely). Echo (3/18) with EF 25-30%. He was seen by EP and decided against ICD given marked obesity.  QRS not wide enough for CRT. Echo 7/19 and in 2/21 with EF 20-25%. Echo this admission with EF < 20%, normal RV.  Admitted with AF/RVR and CHF exacerbation.  CVP low at 3-4, co-ox 63%.  - Holding diuretics for now with low CVP.  - Hold spironolactone, dapagliflozin, losartan with soft BP.  - No entresto with h/o angioedema.  - Starting digoxin as above.  - Toprol XL 25 mg daily.  -  Headaches with Bidil, cannot take.  3. PNA: Fever, diffuse bilateral infiltrates on CXR.  - Ceftriaxone.  4. VT arrest: 6/13, progression from AF/RVR.  Has been turned down for ICD due to size.   - Continue amiodarone.  - EP has seen.  5. OSA: Continue nightly BiPAP. 6. PE: 04/14/2018, diagnosed by V/Q scan. Has been on Xarelto.  7. Anxiety/Panic attacks/depression: Followed by psychiatry. 8. Hyperlipidemia: Continue atorvastatin.   CRITICAL CARE Performed by: [name]  Total critical care time: 35 minutes  Critical care time was exclusive of separately billable procedures and treating other patients.  Critical care was necessary to treat or prevent imminent or life-threatening deterioration.  Critical care was time spent personally by me on the following activities: development of treatment plan with patient and/or surrogate as well as nursing, discussions with consultants, evaluation of patient's response to treatment, examination of patient, obtaining history from patient or surrogate, ordering and performing treatments and interventions, ordering and review of laboratory studies, ordering and review of radiographic studies, pulse oximetry and re-evaluation of patient's condition.  [name] 05/15/2021 4:03 PM

## 2021-05-15 NOTE — Progress Notes (Signed)
Lower extremity venous has been completed.   Preliminary results in CV Proc.   Abram Sander 05/15/2021 11:05 AM

## 2021-05-15 NOTE — Progress Notes (Signed)
Pt on home Bipap, spoke w/ biomed to come check.

## 2021-05-15 NOTE — Progress Notes (Signed)
Nutrition Follow-up  DOCUMENTATION CODES:   Morbid obesity  INTERVENTION:   Ensure Enlive po TID with meals, each supplement provides 350 kcal and 20 grams of protein. Nutritional services ambassador to assist patient with meal options.   NUTRITION DIAGNOSIS:   Inadequate oral intake related to decreased appetite as evidenced by meal completion < 50%.  Ongoing, diet just advanced  GOAL:   Patient will meet greater than or equal to 90% of their needs  MONITOR:   PO intake, Supplement acceptance, Labs  REASON FOR ASSESSMENT:   Ventilator, Consult Enteral/tube feeding initiation and management  ASSESSMENT:   54 yo male admitted with acute hypoxic respiratory failure in the setting of acute on chronic CHF. PMH includes CHF, metabolic syndrome, A fib, alcohol abuse, vaping, bipolar disorder.  Discussed patient in ICU rounds and with RN today. Patient was extubated on 6/13. Currently on BiPAP.  Plans for cardioversion tomorrow at bedside. TF off since extubation.  S/P bedside swallow evaluation with SLP today. Diet advanced to regular with thin liquids. SLP to follow-up for tolerance. RN was able to remove BiPAP mask for a little while this morning so patient could eat his breakfast. He only ate ~15% of breakfast. Will add Ensure with meals to maximize intake while he's off BiPAP.   Labs reviewed. Na 147, mag 3.1 CBG: [phone]  Medications reviewed and include colace, novolog, miralax, KCl.   Weight 161.7 kg today Admission weight 172.4 kg  Diet Order:   Diet Order             Diet regular Room service appropriate? Yes with Assist; Fluid consistency: Thin  Diet effective now                   EDUCATION NEEDS:   Not appropriate for education at this time  Skin:  Skin Assessment: Reviewed RN Assessment  Last BM:  no BM documented  Height:   Ht Readings from Last 1 Encounters:  05/09/21 5\' 8"  (1.727 m)    Weight:   Wt Readings from Last 1  Encounters:  05/15/21 (!) 161.7 kg    Ideal Body Weight:  70 kg  BMI:  Body mass index is 54.2 kg/m.  Estimated Nutritional Needs:   Kcal:  2200-2400  Protein:  140-160 gm  Fluid:  2.2-2.4 L    Lucas Mallow, RD, LDN, CNSC Please refer to Amion for contact information.

## 2021-05-15 NOTE — Progress Notes (Signed)
PT Cancellation Note  Patient Details Name: [name] MRN: [id number] DOB: 1966-09-23   Cancelled Treatment:    Reason Eval/Treat Not Completed: Patient not medically ready;Medical issues which prohibited therapy (Pt in afib since he was extubated yesterday. May be cardioverted later today. Will check back tomorrow.)   Alvira Philips 05/15/2021, 10:21 AM Ellijah Leffel M,PT Acute Rehab Services [phone] (pager)

## 2021-05-15 NOTE — Progress Notes (Addendum)
Advanced Heart Failure Rounding Note  PCP-Cardiologist: Fransico Him, MD   Subjective:   Over the weekend diuresed with IV lasix.  6/10 Intubated. Diuresed.  6/13 Extubated 6/14 A fib RVR- VT arrest . Urgent cardioversion. Extubated.  Placed on amio drip.   Received IV metoprolol this morning.   Complaining of dizziness.  Objective:   Weight Range: (!) 161.7 kg Body mass index is 54.2 kg/m.   Vital Signs:   Temp:  [98 F (36.7 C)-101.1 F (38.4 C)] 98 F (36.7 C) (06/15 0338) Pulse Rate:  [61-140] 96 (06/15 0900) Resp:  [12-34] 19 (06/15 0900) BP: (81-154)/(34-138) 121/63 (06/15 0730) SpO2:  [88 %-100 %] 95 % (06/15 0900) FiO2 (%):  [30 %-50 %] 30 % (06/15 0312) Weight:  [161.7 kg] 161.7 kg (06/15 0400) Last BM Date:  (pta)  Weight change: Filed Weights   05/13/21 0500 05/14/21 0500 05/15/21 0400  Weight: (!) 167.8 kg (!) 157.7 kg (!) 161.7 kg    Intake/Output:   Intake/Output Summary (Last 24 hours) at 05/15/2021 0901 Last data filed at 05/15/2021 0700 Gross per 24 hour  Intake 1692.44 ml  Output 1525 ml  Net 167.44 ml      Physical Exam   CVP 3  General:   No resp difficulty HEENT: Normal Neck: Supple. JVP .difficult to assess  Carotids 2+ bilat; no bruits. No lymphadenopathy or thyromegaly appreciated. Cor: PMI nondisplaced. Tachy Irregular rate & rhythm. No rubs, gallops or murmurs. Lungs: Clear Abdomen: obese, Soft, nontender, nondistended. No hepatosplenomegaly. No bruits or masses. Good bowel sounds. Extremities: No cyanosis, clubbing, rash, edema Neuro: Alert & orientedx3, cranial nerves grossly intact. moves all 4 extremities w/o difficulty. Affect pleasant   Telemetry   A fib RVR 120-140s  EKG  N/a  Labs    CBC Recent Labs    05/14/21 0301 05/14/21 0333 05/14/21 1648 05/14/21 1704 05/15/21 0436  WBC 13.8*  --  13.6*  --  12.4*  NEUTROABS 8.2*  --   --   --   --   HGB 16.6   < > 16.3 16.0 16.4  HCT 47.7   < > 46.7 47.0 47.9   MCV 85.6  --  85.5  --  86.3  PLT 280  --  242  --  231   < > = values in this interval not displayed.   Basic Metabolic Panel Recent Labs    05/14/21 1648 05/14/21 1704 05/15/21 0436  NA 144 147* 147*  K 3.6 3.7 3.8  CL 102  --  106  CO2 29  --  28  GLUCOSE 192*  --  143*  BUN 54*  --  47*  CREATININE 1.81*  --  1.55*  CALCIUM 8.9  --  8.7*  MG 3.1*  --  3.1*  PHOS 3.3  --  3.1   Liver Function Tests Recent Labs    05/14/21 0301 05/14/21 0600  AST 83* 65*  ALT 65* 63*  ALKPHOS 136* 138*  BILITOT 3.0* 2.2*  PROT 8.2* 8.3*  ALBUMIN 3.2* 3.2*   No results for input(s): LIPASE, AMYLASE in the last 72 hours. Cardiac Enzymes No results for input(s): CKTOTAL, CKMB, CKMBINDEX, TROPONINI in the last 72 hours.  BNP: BNP (last 3 results) Recent Labs    05/10/21 0551  BNP 464.3*    ProBNP (last 3 results) No results for input(s): PROBNP in the last 8760 hours.   D-Dimer No results for input(s): DDIMER in the last 72 hours.  Hemoglobin A1C No results for input(s): HGBA1C in the last 72 hours. Fasting Lipid Panel Recent Labs    05/14/21 0226  TRIG 221*   Thyroid Function Tests No results for input(s): TSH, T4TOTAL, T3FREE, THYROIDAB in the last 72 hours.  Invalid input(s): FREET3  Other results:   Imaging    DG Abd Portable 1V  Result Date: 05/14/2021 CLINICAL DATA:  Check gastric tube placement EXAM: PORTABLE ABDOMEN - 1 VIEW COMPARISON:  None. FINDINGS: Gastric catheter is noted with the tip in the stomach. Proximal side port lies in the distal esophagus. This should be advanced several cm deeper into the stomach. IMPRESSION: Gastric catheter as described. This should be advanced deeper into the stomach. Electronically Signed   By: Inez Catalina M.D.   On: 05/14/2021 09:29   ECHOCARDIOGRAM COMPLETE  Result Date: 05/14/2021    ECHOCARDIOGRAM REPORT   Patient Name:   [name] Date of Exam: 05/14/2021 Medical Rec #:  [id number]      Height:       68.0  in Accession #:    [id number]     Weight:       347.7 lb Date of Birth:  09-Jan-1966     BSA:          2.585 m Patient Age:    55 years       BP:           108/76 mmHg Patient Gender: M              HR:           67 bpm. Exam Location:  Inpatient Procedure: 2D Echo, Color Doppler, Cardiac Doppler and Intracardiac            Opacification Agent Indications:    Cardiac arrest  History:        Patient has prior history of Echocardiogram examinations, most                 recent 01/23/2020. CHF, Arrythmias:Atrial Fibrillation; Risk                 Factors:Diabetes.  Sonographer:    Maudry Mayhew MHA, RDMS, RVT, RDCS Referring Phys: [id number] Omar Person  Sonographer Comments: Patient is morbidly obese and echo performed with patient supine and on artificial respirator. IMPRESSIONS  1. Left ventricular ejection fraction, by estimation, is <20%. The left ventricle has severely decreased function. The left ventricle demonstrates global hypokinesis. The left ventricular internal cavity size was severely dilated. Left ventricular diastolic parameters are indeterminate.  2. Right ventricular systolic function is normal. The right ventricular size is normal.  3. Left atrial size was mildly dilated.  4. The mitral valve is normal in structure. Trivial mitral valve regurgitation. No evidence of mitral stenosis.  5. The aortic valve has an indeterminant number of cusps. Aortic valve regurgitation is not visualized. No aortic stenosis is present.  6. Aortic dilatation noted. There is mild dilatation of the ascending aorta, measuring 39 mm.  7. The inferior vena cava is normal in size with greater than 50% respiratory variability, suggesting right atrial pressure of 3 mmHg. FINDINGS  Left Ventricle: Left ventricular ejection fraction, by estimation, is <20%. The left ventricle has severely decreased function. The left ventricle demonstrates global hypokinesis. Definity contrast agent was given IV to delineate the left  ventricular endocardial borders. The left ventricular internal cavity size was severely dilated. There is no left ventricular hypertrophy. Left ventricular diastolic parameters are indeterminate. Right  Ventricle: The right ventricular size is normal. Right ventricular systolic function is normal. Left Atrium: Left atrial size was mildly dilated. Right Atrium: Right atrial size was normal in size. Pericardium: There is no evidence of pericardial effusion. Mitral Valve: The mitral valve is normal in structure. Trivial mitral valve regurgitation. No evidence of mitral valve stenosis. Tricuspid Valve: The tricuspid valve is normal in structure. Tricuspid valve regurgitation is not demonstrated. No evidence of tricuspid stenosis. Aortic Valve: The aortic valve has an indeterminant number of cusps. Aortic valve regurgitation is not visualized. No aortic stenosis is present. Aortic valve mean gradient measures 4.0 mmHg. Aortic valve peak gradient measures 7.7 mmHg. Pulmonic Valve: The pulmonic valve was normal in structure. Pulmonic valve regurgitation is not visualized. No evidence of pulmonic stenosis. Aorta: Aortic dilatation noted. There is mild dilatation of the ascending aorta, measuring 39 mm. Venous: The inferior vena cava is normal in size with greater than 50% respiratory variability, suggesting right atrial pressure of 3 mmHg.  LEFT VENTRICLE PLAX 2D LVIDd:         8.50 cm  Diastology LVIDs:         7.50 cm  LV e' lateral:   7.78 cm/s LV PW:         0.60 cm  LV E/e' lateral: 14.0 LV IVS:        0.40 cm LVOT diam:     2.40 cm LV SV:         29 LV SV Index:   11 LVOT Area:     4.52 cm  LEFT ATRIUM              Index LA diam:        4.70 cm  1.82 cm/m LA Vol (A2C):   180.0 ml 69.63 ml/m LA Vol (A4C):   93.8 ml  36.28 ml/m LA Biplane Vol: 130.0 ml 50.29 ml/m  AORTIC VALVE AV Area (Vmax):    1.22 cm AV Area (Vmean):   1.39 cm AV Area (VTI):     1.25 cm AV Vmax:           139.00 cm/s AV Vmean:          92.700  cm/s AV VTI:            0.231 m AV Peak Grad:      7.7 mmHg AV Mean Grad:      4.0 mmHg LVOT Vmax:         37.50 cm/s LVOT Vmean:        28.400 cm/s LVOT VTI:          0.064 m LVOT/AV VTI ratio: 0.28  AORTA Ao Root diam: 3.10 cm MITRAL VALVE MV Area (PHT): 6.32 cm     SHUNTS MV Decel Time: 120 msec     Systemic VTI:  0.06 m MR Peak grad: 16.0 mmHg     Systemic Diam: 2.40 cm MR Vmax:      200.00 cm/s MV E velocity: 109.00 cm/s MV A velocity: 27.20 cm/s MV E/A ratio:  4.01 Kirk Ruths MD Electronically signed by Kirk Ruths MD Signature Date/Time: 05/14/2021/1:36:58 PM    Final      Medications:     Scheduled Medications:  budesonide (PULMICORT) nebulizer solution  0.5 mg Nebulization BID   chlorhexidine gluconate (MEDLINE KIT)  15 mL Mouth Rinse BID   Chlorhexidine Gluconate Cloth  6 each Topical Daily   gabapentin  100 mg Per Tube Q8H   insulin aspart  0-20 Units Subcutaneous Q4H   metoprolol succinate  25 mg Oral Daily   pantoprazole sodium  40 mg Per Tube QHS   polyethylene glycol  17 g Per Tube Daily   sertraline  100 mg Per Tube Daily   sodium chloride flush  10-40 mL Intracatheter Q12H    Infusions:  sodium chloride     amiodarone 60 mg/hr (05/15/21 0700)   cefTRIAXone (ROCEPHIN)  IV Stopped (05/14/21 1813)   heparin 2,400 Units/hr (05/15/21 0700)   norepinephrine (LEVOPHED) Adult infusion 2 mcg/min (05/14/21 1500)   potassium chloride      PRN Medications: sodium chloride, acetaminophen, clonazePAM, docusate, ipratropium-albuterol, ondansetron (ZOFRAN) IV, oxyCODONE-acetaminophen, polyethylene glycol, sodium chloride flush   Assessment/Plan  Acute on Chronic Systolic Heart Failure: Nonischemic cardiomyopathy. ?If ETOH has played a role. Heavy drinker in the past (now drinks rarely). Echo (3/18) with EF 25-30%.  [place] 2018 showed no CAD. Echo 7/19 and in 2/21 with EF 20-25%. Now admitted w/ a/c CHF in setting of Afib w/ RVR, HR 150s on admit. Duration unknown. Developed  pulmonary edema requiring intubation. VT/VF arrest 6/13 requiring defib + refractory Afib w/ RVR 6/14 requiring emergent DCCV. Limited echo this admit w/ LVEF <20%, moderately reduced RV.  Check CO-OX. CVP 3. Hold diuretics.  - Continue amio for A Fib RVR.              - hold losartan and spiro for now w/ AKI . (no Entresto h/o angioedema w/ lisinopril)  -Intolerant bidil due to headaches.              - Add Toprol XL 25 mg daily.               2. Afib: RVR on admit, 150s. Duration unknown. Required emergent DCCV 6/14. Back in  A fib after extubation.  - Remains in A fib RVR today. Add 25 mg Toprol XL daily. .             - continue amio gtt at 60 hr             - continue heparin gtt             - keep K > 4.0 and Mg > 2.0  - Plan TEE/DC-CV tomorrow. He has missed doses of xarelto. Discussed with CCM.    3. VT/VF Arrest:             - 6/13. ACLS w/ ROSC after 1 min             - continue amio gtt. Add              - keep K > 4.0 and Mg > 2.0             - [facility] 2018 showed no CAD. HS trop 170>>198, likely demand ischemia             - EP following. Once infection clear will need to consider ICD    4. Acute Hypoxic Respiratory Failure             - intubated--> extubated 6/14              - multifactorial from CHF/pulmonary edema + ? PNA. CTA negative for PE -On ceftriaxone                5. AKI on Stage II CKD             - Baseline SCr ~1.0             -  1.4 on admit, bumped to 2.3 this admit, improved but up trending again, 1.5 today                          6. H/o PE: Diagnosed by V/Q scan in 2019. CTA  this admit negative for PE             - continue heparin gtt for now             - transition back to DOAC once stable  7.  ID 6/14 Fever Started on ceftriaxone.   8. Medication Noncompliance.  He has been taking medications intermittently.   Length of Stay: [place], NP  05/15/2021, 9:01 AM  Advanced Heart Failure Team Pager [phone] (M-F; 7a - 5p)  Please  contact [facility] for night-coverage after hours (5p -7a ) and weekends on [email or web address]  Patient seen with NP, agree with the above note.    He remains in AF, currently 120s-130s.  BP stable, not on pressors.  CVP 3.  Creatinine lower at 1.55.  He is on heparin gtt and amiodarone 60 mg/hr.  Co-ox 63% today.    CXR with diffuse bilateral infiltrates, Tm 101.1.  He is on ceftriaxone for PNA coverage.    General: NAD Neck: Thick. No JVD, no thyromegaly or thyroid nodule.  Lungs: Clear to auscultation bilaterally with normal respiratory effort. CV: Nondisplaced PMI.  Heart Tachy, irregular S1/S2, no S3/S4, no murmur.  No peripheral edema.   Abdomen: Soft, nontender, no hepatosplenomegaly, no distention.  Skin: Intact without lesions or rashes.  Neurologic: Alert and oriented x 3.  Psych: Normal affect. Extremities: No clubbing or cyanosis.  HEENT: Normal.   1. Atrial fibrillation: With RVR.  Patient has history of PAF. He was admitted with AF/RVR, this may have triggered CHF exacerbation, ?tachy-mediated.  HR still elevated currently on amiodarone 60 mg/hr.  - Continue amiodarone gtt 60 mg/hr.   - Continue heparin gtt.  - Add Toprol XL 25 daily.  - Add digoxin.  - Will need to get him back into NSR.  He reports missing Xarelto doses prior to admission.  Plan TEE-guided DCCV tomorrow if he remains in AF.  2.  Acute on chronic systolic CHF: Nonischemic cardiomyopathy. ?If ETOH has played a role (now drinks rarely). Echo (3/18) with EF 25-30%. He was seen by EP and decided against ICD given marked obesity.  QRS not wide enough for CRT. Echo 7/19 and in 2/21 with EF 20-25%. Echo this admission with EF < 20%, normal RV.  Admitted with AF/RVR and CHF exacerbation.  CVP low at 3-4, co-ox 63%.  - Holding diuretics for now with low CVP.  - Hold spironolactone, dapagliflozin, losartan with soft BP.  - No entresto with h/o angioedema.  - Starting digoxin as above.  - Toprol XL 25 mg daily.  -  Headaches with Bidil, cannot take.  3. PNA: Fever, diffuse bilateral infiltrates on CXR.  - Ceftriaxone.  4. VT arrest: 6/13, progression from AF/RVR.  Has been turned down for ICD due to size.   - Continue amiodarone.  - EP has seen.  5. OSA: Continue nightly BiPAP. 6. PE: 04/14/2018, diagnosed by V/Q scan. Has been on Xarelto.  7. Anxiety/Panic attacks/depression: Followed by psychiatry. 8. Hyperlipidemia: Continue atorvastatin.   CRITICAL CARE Performed by: [name]  Total critical care time: 35 minutes  Critical care time was exclusive of separately billable procedures and treating other patients.  Critical care was necessary to treat or prevent imminent or life-threatening deterioration.  Critical care was time spent personally by me on the following activities: development of treatment plan with patient and/or surrogate as well as nursing, discussions with consultants, evaluation of patient's response to treatment, examination of patient, obtaining history from patient or surrogate, ordering and performing treatments and interventions, ordering and review of laboratory studies, ordering and review of radiographic studies, pulse oximetry and re-evaluation of patient's condition.  [name] 05/15/2021 4:03 PM

## 2021-05-15 NOTE — Progress Notes (Signed)
OT Cancellation Note  Patient Details Name: [name] MRN: [id number] DOB: Aug 11, 1966   Cancelled Treatment:    Reason Eval/Treat Not Completed: Patient not medically ready.  Pt with elevated HR.  Will reattempt.  Nilsa Nutting., OTR/L Acute Rehabilitation Services Pager [phone] Office [phone]   Lucille Passy M 05/15/2021, 4:57 PM

## 2021-05-15 NOTE — Progress Notes (Signed)
ANTICOAGULATION CONSULT NOTE - Follow Up Consult  Pharmacy Consult for IV Heparin Indication: atrial fibrillation  Allergies  Allergen Reactions   Ace Inhibitors Anaphylaxis and Swelling    Angioedema   Bidil [Isosorb Dinitrate-Hydralazine] Other (See Comments)    headache   Digoxin And Related     Unspecified "side effects"   Buspirone Other (See Comments)    dizziness    Patient Measurements: Height: 5\' 8"  (172.7 cm) Weight: (!) 161.7 kg (356 lb 7.7 oz) IBW/kg (Calculated) : 68.4 Heparin Dosing Weight: 111 kg  Vital Signs: Temp: 98.1 F (36.7 C) (06/15 1509) Temp Source: Oral (06/15 1509) BP: 109/78 (06/15 1700) Pulse Rate: 94 (06/15 1700)  Labs: Recent Labs    05/14/21 0301 05/14/21 0333 05/14/21 0600 05/14/21 0804 05/14/21 1648 05/14/21 1704 05/14/21 1705 05/15/21 0100 05/15/21 0436 05/15/21 0519 05/15/21 1516  HGB 16.6   < >  --   --  16.3 16.0  --   --  16.4  --   --   HCT 47.7   < >  --   --  46.7 47.0  --   --  47.9  --   --   PLT 280  --   --   --  242  --   --   --  231  --   --   APTT  --    < >  --  28  --   --  33 45*  --   --  57*  HEPARINUNFRC  --   --   --  0.44  --   --  0.43  --   --  0.56  --   CREATININE 1.80*  --   --   --  1.81*  --   --   --  1.55*  --   --   TROPONINIHS 170*  --  198*  --   --   --   --   --   --   --   --    < > = values in this interval not displayed.    Estimated Creatinine Clearance: 83.3 mL/min (A) (by C-G formula based on SCr of 1.55 mg/dL (H)).   Medications:  Infusions:   sodium chloride     amiodarone 60 mg/hr (05/15/21 1700)   cefTRIAXone (ROCEPHIN)  IV Stopped (05/14/21 1813)   heparin 2,400 Units/hr (05/15/21 1700)   norepinephrine (LEVOPHED) Adult infusion 2 mcg/min (05/14/21 1500)    Assessment: 55 years of age male transitioned for Xarelto to IV Heparin earlier today in case of procedures.   Utilizing aPTT at this time due to recent Xarelto causing falsely elevated heparin levels.  aPTT this  PM is up at 57 but still below goal after increase to 2400 units/hr.  No bleeding reported.   Goal of Therapy:  Heparin level 0.3-0.7 units/ml aPTT 66-102 seconds Monitor platelets by anticoagulation protocol: Yes   Plan:  Increase IV Heparin to 2750 units/hr. aPTT in 6 hours.  Daily aPTT, Heparin level, and CBC while on therapy.  Sloan Leiter, PharmD, BCPS, BCCCP Clinical Pharmacist Please refer to [facility] for [place] numbers 05/15/2021,5:23 PM

## 2021-05-15 NOTE — Progress Notes (Signed)
Patient continues to take off bipap mask. RT and RN have talked with pt multiple times about keeping it on. Pt currently back on. RT will continue to monitor.

## 2021-05-15 NOTE — Evaluation (Signed)
Clinical/Bedside Swallow Evaluation Patient Details  Name: [name] MRN: [id number] Date of Birth: Aug 04, 1966  Today's Date: 05/15/2021 Time: SLP Start Time (ACUTE ONLY): 7858 SLP Stop Time (ACUTE ONLY): 8502 SLP Time Calculation (min) (ACUTE ONLY): 20.62 min  Past Medical History:  Past Medical History:  Diagnosis Date   Alcohol abuse    Anxiety state, unspecified    Atrial fibrillation (HCC)    CHF (congestive heart failure) (HCC)    Chronic systolic heart failure (HCC)    Diabetes mellitus, type II (HCC)    Edema    Gout    History of medication noncompliance    Migraine    Obesity, unspecified    Obstructive sleep apnea    Psychiatric disorder    Pulmonary embolism (HCC)    Shortness of breath    Past Surgical History:  Past Surgical History:  Procedure Laterality Date   CARDIAC CATHETERIZATION     CARDIAC CATHETERIZATION N/A 08/13/2015   Procedure: Right/Left Heart Cath and Coronary Angiography;  Surgeon: [name], MD;  Location: [facility];  Service: Cardiovascular;  Laterality: N/A;   RIGHT/LEFT HEART CATH AND CORONARY ANGIOGRAPHY N/A 04/14/2017   Procedure: Right/Left Heart Cath and Coronary Angiography;  Surgeon: [name], MD;  Location: [facility];  Service: Cardiovascular;  Laterality: N/A;   TESTICLE SURGERY     HPI:  Pt is a 55 y/o male who presented with shortness of breath. In ER, received some fluids and got worse. Pt was placed on biPAP but had frank hemoptysis and was intubated for airway protection. ETT 6/10-6/13; 6/13-6/14. Pt failed [facility] since he stopped drinking. CXR 6/14: Diffuse bilateral patchy opacities, could reflect edema, atelectasis and/or airspace disease.   Assessment / Plan / Recommendation Clinical Impression  Pt was seen for bedside swallow evaluation with his wife present; both parties denied the having a history of dysphagia. Oral mechanism exam was [facility] and he presented with adequate, natural dentition.  Pt's vocal intensity was mildly reduced and pt's wife described his voice as "weak" with a return to baseline of ~90%. He tolerated all solids and liquids without signs or symptoms of oropharyngeal dysphagia even when challenged with larger boluses and consecutive swallows. A regular texture diet with thin liquids is recommended at this time and SLP will follow briefly to ensure diet tolerance. SLP Visit Diagnosis: Dysphagia, unspecified (R13.10)    Aspiration Risk  Mild aspiration risk    Diet Recommendation Regular;Thin liquid   Liquid Administration via: Cup;Straw Medication Administration: Whole meds with liquid Supervision: Patient able to self feed Postural Changes: Seated upright at 90 degrees    Other  Recommendations Oral Care Recommendations: Oral care BID   Follow up Recommendations None      Frequency and Duration min 1 x/week  1 week       Prognosis Prognosis for Safe Diet Advancement: Good      Swallow Study   General Date of Onset: 05/14/21 HPI: Pt is a 55 y/o male who presented with shortness of breath. In ER, received some fluids and got worse. Pt was placed on biPAP but had frank hemoptysis and was intubated for airway protection. ETT 6/10-6/13; 6/13-6/14. Pt failed [facility] since he stopped drinking. CXR 6/14: Diffuse bilateral patchy opacities, could reflect edema, atelectasis and/or airspace disease. Type of Study: Bedside Swallow Evaluation Previous Swallow Assessment: none Diet Prior to this Study: NPO Temperature Spikes Noted: No Respiratory Status: Room air History of Recent Intubation: Yes Length of  Intubations (days): 4 days Date extubated: 05/14/21 Behavior/Cognition: Alert;Cooperative;Pleasant mood Oral Cavity Assessment: Within Functional Limits Oral Care Completed by SLP: Yes Oral Cavity - Dentition: Adequate natural dentition Vision: Functional for self-feeding Self-Feeding Abilities: Able to feed self Patient Positioning: Upright in  bed Baseline Vocal Quality: Normal Volitional Cough: Strong Volitional Swallow: Able to elicit    Oral/Motor/Sensory Function Overall Oral Motor/Sensory Function: Within functional limits   Ice Chips Ice chips: Within functional limits Presentation: Spoon   Thin Liquid Thin Liquid: Within functional limits Presentation: Straw    Nectar Thick Nectar Thick Liquid: Not tested   Honey Thick Honey Thick Liquid: Not tested   Puree Puree: Within functional limits Presentation: Spoon   Solid     Solid: Within functional limits Presentation: [name], [place], [place] Office number [phone] Pager [phone]  [name] 05/15/2021,8:55 AM

## 2021-05-15 NOTE — Progress Notes (Signed)
ANTICOAGULATION CONSULT NOTE - Follow Up Consult  Pharmacy Consult for heparin Indication: atrial fibrillation   Labs: Recent Labs    05/14/21 0301 05/14/21 0333 05/14/21 0600 05/14/21 0804 05/14/21 1648 05/14/21 1704 05/14/21 1705 05/15/21 0100 05/15/21 0436  HGB 16.6   < >  --   --  16.3 16.0  --   --  16.4  HCT 47.7   < >  --   --  46.7 47.0  --   --  47.9  PLT 280  --   --   --  242  --   --   --  231  APTT  --   --   --  28  --   --  33 45*  --   HEPARINUNFRC  --   --   --  0.44  --   --  0.43  --   --   CREATININE 1.80*  --   --   --  1.81*  --   --   --  1.55*  TROPONINIHS 170*  --  198*  --   --   --   --   --   --    < > = values in this interval not displayed.    Assessment: 55yo male remains subtherapeutic on heparin after rate change; no gtt issues or signs of bleeding per RN.  Goal of Therapy:  aPTT 66-102 seconds   Plan:  Will increase heparin gtt by ~3 units/kgABW/hr to 2400 units/hr and check level in 6 hours.    Wynona Neat, PharmD, BCPS  05/15/2021,6:09 AM

## 2021-05-15 NOTE — Progress Notes (Signed)
Pts HR sustained in 130s-140s.  No PRNs ordered/available. BP is stable. Spoke w/ Heart Failure team to relay.

## 2021-05-15 NOTE — Progress Notes (Signed)
Telemetry reviewed, remains in rapid AFib 130's-140's a little slower this AM 110's-120's No VT D/w Dr. Aundra Dubin, will continue amio today and if needed they will plan DCCV tomorrow.  Tommye Standard, PA-C

## 2021-05-15 NOTE — Progress Notes (Signed)
NAME:  [name], MRN:  [id number], DOB:  [birth year], LOS: 5 ADMISSION DATE:  05/09/2021, CONSULTATION DATE:  05/10/21 REFERRING MD:  EDP, CHIEF COMPLAINT:  SOB, chest pain   History of Present Illness:  55 year old man who presented with shortness of breath. This has been refractory to bronchodilators.  In ER, received some fluids and got worse.  Placed on BIPAP but began having possible frank hemoptysis so intubated for airway protection.  CTA read as multifocal pneumonia although this was underwhelming on my read.  Currently on high vent requirements and PCCM to admit.  Pertinent  Medical History  Metabolic syndrome CHF Afib and prior VTE on AC Alcohol abuse Vaping Bipolar 1  Significant Hospital Events:  6/10 intubation, CVL, admission. Initial concern for lung injury related to vaping but patient significantly improved with aggressive diuresing which indication SOB, cough, and hemoptysis was likely secondary to severe volume overload in the setting of severe CHF with chronic anticoagulation  6/10 No acute events overnight, diuresed well with IV lasix yesterday. Vent setting much improved  6/11 Continue to diurese well with 2L removed yesterday. Pressor and sedation requirements also decreased  6/12 diuresis 6/13 extubated 6/14 refractory afib/ agitation leading to Vfib arrest x 1 min, reintubation; cardioversion to sinus 6/15 extubated, went back into Afib almost immediately on removal of ETT  Interim History / Subjective:  No events. Wants to eat. Remains in afib with intermittent RVR.  Objective   Blood pressure 121/63, pulse (!) 122, temperature 98 F (36.7 C), temperature source Axillary, resp. rate 19, height 5\' 8"  (1.727 m), weight (!) 161.7 kg, SpO2 98 %. CVP:  [1 mmHg-17 mmHg] 14 mmHg  Vent Mode: Stand-by FiO2 (%):  [30 %-60 %] 30 % Set Rate:  [24 bmp] 24 bmp Vt Set:  [550 mL] 550 mL PEEP:  [5 cmH20] 5 cmH20 Pressure Support:  [8 cmH20] 8 cmH20 Plateau  Pressure:  [16 cmH20] 16 cmH20   Intake/Output Summary (Last 24 hours) at 05/15/2021 0743 Last data filed at 05/15/2021 0600 Gross per 24 hour  Intake 1877.52 ml  Output 1565 ml  Net 312.52 ml    Filed Weights   05/13/21 0500 05/14/21 0500 05/15/21 0400  Weight: (!) 167.8 kg (!) 157.7 kg (!) 161.7 kg    Examination: Constitutional: chronically ill appearing obese man on BIPAP  Eyes: Strabismus, pupils equal Ears, nose, mouth, and throat: BIPAP in place with good seal Cardiovascular: Irregular, tachycardic, ext warm Respiratory: Diminished bases due to body habitus, no accessory muscle use Gastrointestinal: soft, +BS Skin: No rashes, normal turgor Neurologic: Moves all 4 ext to command Psychiatric: RASS 0, mildly anxious     Labs/imaging that I havepersonally reviewed   6/10 CTA chest No PE Multifocal pneumonia. COVID-19 infection not excluded. Likely reactive right hilar and mediastinal lymph node. Cardiomegaly 6/10 ECHO EF less than 20%, with global hypokinesis  On ceftriaxone, afebrile since yesterday   Resolved Hospital Problem list   N/a  Assessment & Plan:  Acute hypoxemic respiratory failure- recurrent 6/14 in context of refractory afib, getting dilt and likely resultant poor flow Severe OSA Chronic NICM, question of EtOH induced: his EF is actually lower even with abstinence; his CVP and coox look okay; clinically looks dry Acute Kidney Injury - stabilized/improved Hx of Afib/VTE- on heparin drip Refractory Afib- on amio drip, received multiple boluses and dig x 1, Hx of HTN/HLD DM2 with hyperglycemia- minimal SSI need History of anxiety and bipolar Early 6/14 Vfib/Vtach arrest-  brief, in setting of extubation, afib, resp distress, inability to tolerate BIPAP; baseline severe NICM Fevers, question of PNA- started on ceftriaxone 6/14, Pct benign  - Wean off BIPAP during day - Keep in ICU, high risk for needing intubation again - He does not know his  intolerance to digoxin, we may need to be more aggressive with this; will d/w CHF team - SLP eval - Continue heparin and amio drip - Needs time to regain strength - Will need AICD once we assure no infection - Replete K, keep mg/phjos WNL  Best practice   Diet:  pending SLP eval Pain/Anxiety/Delirium protocol (if indicated): No VAP protocol (if indicated): off DVT prophylaxis: heparin gtt GI prophylaxis: PPI Glucose control:  SSI No Central venous access:  Yes, and it is still needed Arterial line:  out Foley:  Yes, and it is still needed Mobility:  bed rest  PT consulted: N/A Last date of multidisciplinary goals of care discussion: Wife updated at bedside 6/14 Code Status:  full code Disposition: ICU pending respiratory and hemodynamic stability  35 minutes cc time not including any separately billable procedures Erskine Emery MD PCCM

## 2021-05-15 NOTE — Progress Notes (Signed)
Patient has been having difficulty with wearing our bipap machine at night.  Patient's family brought patient's home machine to wear.  Patient stated that he was having a little difficulty breathing and would like to go ahead and be placed on his machine.  RT examined machine and did not note any fire hazard or electrical abnormality therefore patient was placed on machine and stated that he was able to breath better.  RN will call biomed to see if they need to come check equipment as well.  Patient tolerating well at this time.  Will continue to monitor.

## 2021-05-16 ENCOUNTER — Inpatient Hospital Stay (HOSPITAL_COMMUNITY): Payer: Medicare HMO

## 2021-05-16 DIAGNOSIS — I340 Nonrheumatic mitral (valve) insufficiency: Secondary | ICD-10-CM

## 2021-05-16 DIAGNOSIS — I4891 Unspecified atrial fibrillation: Secondary | ICD-10-CM

## 2021-05-16 DIAGNOSIS — I509 Heart failure, unspecified: Secondary | ICD-10-CM | POA: Diagnosis not present

## 2021-05-16 LAB — BASIC METABOLIC PANEL
Anion gap: 9 (ref 5–15)
BUN: 39 mg/dL — ABNORMAL HIGH (ref 6–20)
CO2: 27 mmol/L (ref 22–32)
Calcium: 8.9 mg/dL (ref 8.9–10.3)
Chloride: 107 mmol/L (ref 98–111)
Creatinine, Ser: 1.4 mg/dL — ABNORMAL HIGH (ref 0.61–1.24)
GFR, Estimated: >60 mL/min (ref >60)
Glucose, Bld: 159 mg/dL — ABNORMAL HIGH (ref 70–99)
Potassium: 3.7 mmol/L (ref 3.5–5.1)
Sodium: 143 mmol/L (ref 135–145)

## 2021-05-16 LAB — GLUCOSE, CAPILLARY
Glucose-Capillary: 134 mg/dL — ABNORMAL HIGH (ref 70–99)
Glucose-Capillary: 135 mg/dL — ABNORMAL HIGH (ref 70–99)
Glucose-Capillary: 160 mg/dL — ABNORMAL HIGH (ref 70–99)
Glucose-Capillary: 104 mg/dL — ABNORMAL HIGH (ref 70–99)
Glucose-Capillary: 195 mg/dL — ABNORMAL HIGH (ref 70–99)
Glucose-Capillary: 118 mg/dL — ABNORMAL HIGH (ref 70–99)

## 2021-05-16 LAB — APTT
aPTT: 69 seconds — ABNORMAL HIGH (ref 24–36)
aPTT: 86 seconds — ABNORMAL HIGH (ref 24–36)

## 2021-05-16 LAB — HEPARIN LEVEL (UNFRACTIONATED): Heparin Unfractionated: 1.02 IU/mL — ABNORMAL HIGH (ref 0.30–0.70)

## 2021-05-16 LAB — COOXEMETRY PANEL
Carboxyhemoglobin: 1 % (ref 0.5–1.5)
Methemoglobin: 0.9 % (ref 0.0–1.5)
O2 Saturation: 55.2 %
Total hemoglobin: 17.4 g/dL — ABNORMAL HIGH (ref 12.0–16.0)

## 2021-05-16 MED ORDER — POTASSIUM CHLORIDE 10 MEQ/50ML IV SOLN
10.0000 meq | INTRAVENOUS | Status: AC
  Administered 2021-05-16 (×4): 10 meq via INTRAVENOUS
  Filled 2021-05-16 (×4): qty 50

## 2021-05-16 MED ORDER — POTASSIUM CHLORIDE CRYS ER 20 MEQ PO TBCR
40.0000 meq | EXTENDED_RELEASE_TABLET | Freq: Once | ORAL | Status: AC
  Administered 2021-05-16: 40 meq via ORAL
  Filled 2021-05-16: qty 2

## 2021-05-16 MED ORDER — POTASSIUM CHLORIDE CRYS ER 20 MEQ PO TBCR
40.0000 meq | EXTENDED_RELEASE_TABLET | Freq: Once | ORAL | Status: DC
  Filled 2021-05-16: qty 2

## 2021-05-16 MED ORDER — ETOMIDATE 2 MG/ML IV SOLN
20.0000 mg | Freq: Once | INTRAVENOUS | Status: AC
  Administered 2021-05-16: 20 mg via INTRAVENOUS
  Filled 2021-05-16: qty 10

## 2021-05-16 MED ORDER — FUROSEMIDE 10 MG/ML IJ SOLN
40.0000 mg | Freq: Once | INTRAMUSCULAR | Status: AC
  Administered 2021-05-16: 40 mg via INTRAVENOUS
  Filled 2021-05-16: qty 4

## 2021-05-16 MED ORDER — POTASSIUM CHLORIDE CRYS ER 20 MEQ PO TBCR: 40.0000 meq | EXTENDED_RELEASE_TABLET | Freq: Once | ORAL | Status: DC

## 2021-05-16 NOTE — Progress Notes (Signed)
OT Cancellation Note  Patient Details Name: [name] MRN: [id number] DOB: 08/10/1966   Cancelled Treatment:    Reason Eval/Treat Not Completed: Patient not medically ready. Per RN patient with HR 130's-140's at rest. Plan for cardioversion at bedside this date. OT to check back as time allows.   Gloris Manchester OTR/L Supplemental OT, Department of rehab services [phone]  Amberly Livas R H. 05/16/2021, 7:26 AM

## 2021-05-16 NOTE — Progress Notes (Signed)
K 3.7 Electrolytes replaced per [facility] electrolyte replacement protocol

## 2021-05-16 NOTE — Evaluation (Signed)
Occupational Therapy Evaluation Patient Details Name: [name] MRN: [id number] DOB: 1966-08-31 Today's Date: 05/16/2021    History of Present Illness 55 year old man admitted 6/10 who presented with shortness of breath. VDRF 6/10-6/15.  Post extubation with afib.  Cardioversion 6/16.PMH:  HTN, Bipolar, anxiety, Diabetes   Clinical Impression   PTA patient was living with his wife and 3 children and was grossly I with ADLs/IADLs and working as a DJ. Patient currently functioning below baseline demonstrating observed ADLs including toileting with use of urinal in standing with Min guard to Max A grossly. Patient also limited by deficits listed below including decreased safety awareness, decreased knowledge of current deficits and decreased static/dynamic sitting/standing balance and would benefit from continued acute OT services in prep for safe d/c home with 24hr supervision/assist from wife. OT will continue to follow acutely.     Follow Up Recommendations  Home health OT;Supervision/Assistance - 24 hour    Equipment Recommendations  None recommended by OT    Recommendations for Other Services       Precautions / Restrictions Precautions Precautions: Fall Restrictions Weight Bearing Restrictions: No      Mobility Bed Mobility Overal bed mobility: Needs Assistance Bed Mobility: Supine to Sit     Supine to sit: Min guard     General bed mobility comments: min guard for safety. Pts left LE hanging off bed on arrival with pt trying to get to EOB with bed rail up to use urinal.    Transfers Overall transfer level: Needs assistance Equipment used: 2 person hand held assist Transfers: Sit to/from [employer] Transfers Sit to Stand: Min assist;+2 safety/equipment;From elevated surface Stand pivot transfers: Min guard;+2 safety/equipment       General transfer comment: Pt was able to stand but initially with posterior lean and took a little time for pt to get  balance with pt with wide BOS.  Pt attempted to use urinal in standing but could not do so with therapists standing with him and too unsafe to allow pt to stand alone. Pt took pivotal steps to chair with min guard HHA. Moves impulsively needing cues and steadying assist.    Balance Overall balance assessment: Needs assistance Sitting-balance support: Bilateral upper extremity supported;Feet supported;No upper extremity supported Sitting balance-Leahy Scale: Fair Sitting balance - Comments: Slow posterior lean seated EOB requiring Min guard initially. Progressed to supervision A. Postural control: Posterior lean Standing balance support: Bilateral upper extremity supported;During functional activity Standing balance-Leahy Scale: Poor Standing balance comment: relies on UE support for steadying                           ADL either performed or assessed with clinical judgement   ADL Overall ADL's : Needs assistance/impaired Eating/Feeding: Set up;Sitting                       Toilet Transfer: Min guard;+2 for physical assistance Toilet Transfer Details (indicate cue type and reason): Min guard +2 for safety given patient stature and impulsivity. Simulated with transfer to recliner.         Functional mobility during ADLs: Min guard;Minimal assistance;+2 for physical assistance;+2 for safety/equipment General ADL Comments: Patient greatly limited by increased time needed to process verbal informaiton, decreased safety awareness and impulsivity.     Vision Baseline Vision/History: Wears glasses Wears Glasses: Distance only (When driving at night) Patient Visual Report: No change from baseline;Other (comment) (Disconjugate gaze at  baseline.) Vision Assessment?: No apparent visual deficits     Perception     Praxis      Pertinent Vitals/Pain Pain Assessment: No/denies pain     Hand Dominance     Extremity/Trunk Assessment Upper Extremity Assessment Upper  Extremity Assessment: Overall WFL for tasks assessed   Lower Extremity Assessment Lower Extremity Assessment: Defer to PT evaluation   Cervical / Trunk Assessment Cervical / Trunk Assessment: Normal   Communication Communication Communication: No difficulties   Cognition Arousal/Alertness: Awake/alert Behavior During Therapy: Impulsive Overall Cognitive Status: Impaired/Different from baseline Area of Impairment: Safety/judgement;Problem solving                         Safety/Judgement: Decreased awareness of safety;Decreased awareness of deficits   Problem Solving: Slow processing;Requires verbal cues General Comments: Overall, did well cognitively but does have some impulsivity and poor safety awareness   General Comments  VSS on 3LO2.  Diastolic BP elevated with nurse aware.    Exercises     Shoulder Instructions      Home Living Family/patient expects to be discharged to:: Private residence Living Arrangements: Spouse/significant other Available Help at Discharge: Family;Available 24 hours/day (3 children) Type of Home: House Home Access: Stairs to enter [employer] of Steps: 3 Entrance Stairs-Rails: Right;Left;Can reach both Home Layout: One level     Bathroom Shower/Tub: Teacher, early years/pre: Standard     Home Equipment: None   Additional Comments: Wife drives school bus but will be home for summer - works from backyard per pt      Prior Functioning/Environment Level of Independence: Independent        Comments: Works as DJ        OT Problem List: Decreased strength;Impaired balance (sitting and/or standing);Decreased cognition;Decreased safety awareness;Decreased knowledge of use of DME or AE;Cardiopulmonary status limiting activity;Obesity      OT Treatment/Interventions: Self-care/ADL training;Therapeutic exercise;Energy conservation;DME and/or AE instruction;Therapeutic activities;Cognitive  remediation/compensation;Patient/family education;Balance training    OT Goals(Current goals can be found in the care plan section) Acute Rehab OT Goals Patient Stated Goal: to go home OT Goal Formulation: With patient Time For Goal Achievement: 05/30/21 Potential to Achieve Goals: Good ADL Goals Pt Will Perform Grooming: with modified independence;standing Pt Will Perform Upper Body Dressing: sitting;with modified independence Pt Will Perform Lower Body Dressing: with modified independence;sitting/lateral leans Pt Will Transfer to Toilet: with modified independence;ambulating Pt Will Perform Toileting - Clothing Manipulation and hygiene: with modified independence;sit to/from stand;sitting/lateral leans  OT Frequency: Min 2X/week   Barriers to D/C:            Co-evaluation              AM-PAC OT "6 Clicks" Daily Activity     Outcome Measure Help from another person eating meals?: None Help from another person taking care of personal grooming?: A Little Help from another person toileting, which includes using toliet, bedpan, or urinal?: A Lot Help from another person bathing (including washing, rinsing, drying)?: A Lot Help from another person to put on and taking off regular upper body clothing?: A Little Help from another person to put on and taking off regular lower body clothing?: A Lot 6 Click Score: 16   End of Session Equipment Utilized During Treatment: Gait belt;Oxygen Nurse Communication: Mobility status;Other (comment) (Response to treatment)  Activity Tolerance: Patient tolerated treatment well Patient left: in chair;with call bell/phone within reach;with chair alarm set  OT Visit Diagnosis:  Unsteadiness on feet (R26.81);Muscle weakness (generalized) (M62.81)                Time: 1125-1150 OT Time Calculation (min): 25 min Charges:  OT General Charges $OT Visit: 1 Visit OT Evaluation $OT Eval Moderate Complexity: 1 Mod  Kalsey Lull H. OTR/L Supplemental  OT, Department of rehab services [phone]  Mertha Clyatt R H. 05/16/2021, 1:40 PM

## 2021-05-16 NOTE — Progress Notes (Signed)
PT Cancellation Note  Patient Details Name: [name] MRN: [id number] DOB: [birth year]   Cancelled Treatment:    Reason Eval/Treat Not Completed: Medical issues which prohibited therapy (Per RN patient with HR 130's-140's at rest. Plan for cardioversion at bedside this date.Will check back later date.)   Alvira Philips 05/16/2021, 8:40 AM Reshaun Briseno M,PT Acute Rehab Services [phone] (pager)

## 2021-05-16 NOTE — Progress Notes (Signed)
eLink Physician-Brief Progress Note Patient Name: [name] DOB: 14-Dec-1965 MRN: [id number]   Date of Service  05/16/2021  HPI/Events of Note  Sinus bradycardia - HR = 52-55. BP = 110/74. Amiodarone IV infusion currently running at 1 mg/min.  eICU Interventions  Plan: Decrease Amiodarone IV infusion to 0.5 mg/min     Intervention Category Major Interventions: Arrhythmia - evaluation and management  [name] Eugene 05/16/2021, 7:56 PM

## 2021-05-16 NOTE — Procedures (Signed)
Conscious sedation provided x 10 mins with etomidate 20mg  x 1; no complications, TEE neg for clot, converted to sinus by Dr. Aundra Dubin.  Erskine Emery MD PCCM

## 2021-05-16 NOTE — Progress Notes (Signed)
  Echocardiogram Echocardiogram Transesophageal has been performed.  [name] 05/16/2021, 10:16 AM

## 2021-05-16 NOTE — Progress Notes (Signed)
ANTICOAGULATION CONSULT NOTE - Follow Up Consult  Pharmacy Consult for IV Heparin Indication: atrial fibrillation  Allergies  Allergen Reactions   Ace Inhibitors Anaphylaxis and Swelling    Angioedema   Bidil [Isosorb Dinitrate-Hydralazine] Other (See Comments)    headache   Digoxin And Related     Unspecified "side effects"   Buspirone Other (See Comments)    dizziness    Patient Measurements: Height: 5\' 8"  (172.7 cm) Weight: (!) 161.7 kg (356 lb 7.7 oz) IBW/kg (Calculated) : 68.4 Heparin Dosing Weight: 111 kg  Vital Signs: Temp: 97.7 F (36.5 C) (06/15 2330) Temp Source: Oral (06/15 2330) BP: 128/112 (06/15 2100) Pulse Rate: 138 (06/15 2137)  Labs: Recent Labs    05/14/21 0301 05/14/21 0301 05/14/21 0333 05/14/21 0600 05/14/21 0804 05/14/21 1648 05/14/21 1704 05/14/21 1705 05/15/21 0100 05/15/21 0436 05/15/21 0519 05/15/21 1516 05/15/21 2342  HGB 16.6  --    < >  --   --  16.3 16.0  --   --  16.4  --   --  16.6  HCT 47.7  --    < >  --   --  46.7 47.0  --   --  47.9  --   --  48.1  PLT 280  --   --   --   --  242  --   --   --  231  --   --  264  APTT  --    < >  --   --  28  --   --  33 45*  --   --  57* 69*  HEPARINUNFRC  --   --   --   --  0.44  --   --  0.43  --   --  0.56  --   --   CREATININE 1.80*  --   --   --   --  1.81*  --   --   --  1.55*  --   --   --   TROPONINIHS 170*  --   --  198*  --   --   --   --   --   --   --   --   --    < > = values in this interval not displayed.     Estimated Creatinine Clearance: 83.3 mL/min (A) (by C-G formula based on SCr of 1.55 mg/dL (H)).   Medications:  Infusions:   sodium chloride     amiodarone 60 mg/hr (05/15/21 2127)   cefTRIAXone (ROCEPHIN)  IV 2 g (05/15/21 1815)   heparin 2,750 Units/hr (05/15/21 2217)   norepinephrine (LEVOPHED) Adult infusion 2 mcg/min (05/14/21 1500)    Assessment: 55 years of age male transitioned for Xarelto to IV Heparin earlier today in case of procedures.    Utilizing aPTT at this time due to recent Xarelto causing falsely elevated heparin levels.  aPTT this PM is up at 57 but still below goal after increase to 2400 units/hr.  No bleeding reported.   6/16 AM update:  aPTT therapeutic after rate increase  Goal of Therapy:  Heparin level 0.3-0.7 units/ml aPTT 66-102 seconds Monitor platelets by anticoagulation protocol: Yes   Plan:  Cont heparin 2750 units/hr Confirmatory aPTT and heparin level with AM labs  Narda Bonds, PharmD, [place] Pharmacist Phone: [phone]

## 2021-05-16 NOTE — Procedures (Addendum)
Electrical Cardioversion Procedure Note CARR SHARTZER [id number] [birth year]  Procedure: Electrical Cardioversion Indications:  Atrial Fibrillation  Procedure Details Consent: Risks of procedure as well as the alternatives and risks of each were explained to the (patient/caregiver).  Consent for procedure obtained. Time Out: Verified patient identification, verified procedure, site/side was marked, verified correct patient position, special equipment/implants available, medications/allergies/relevent history reviewed, required imaging and test results available.  Performed  Patient placed on cardiac monitor, pulse oximetry, supplemental oxygen as necessary.  Sedation given:  Etomidate per CCM Pacer pads placed anterior and posterior chest.  Cardioverted 1 time(s).  Cardioverted at [place].  Evaluation Findings: Post procedure EKG shows: NSR Complications: None Patient did tolerate procedure well.   [name] 05/16/2021, 9:53 AM

## 2021-05-16 NOTE — Progress Notes (Addendum)
NAME:  [name], MRN:  [id number], DOB:  12-24-1965, LOS: 6 ADMISSION DATE:  05/09/2021, CONSULTATION DATE:  05/10/21 REFERRING MD:  EDP, CHIEF COMPLAINT:  SOB, chest pain   History of Present Illness:  55 year old man who presented with shortness of breath. This has been refractory to bronchodilators.  In ER, received some fluids and got worse.  Placed on BIPAP but began having possible frank hemoptysis so intubated for airway protection.  CTA read as multifocal pneumonia although this was underwhelming on my read.  Currently on high vent requirements and PCCM to admit.  Pertinent  Medical History  Metabolic syndrome CHF Afib and prior VTE on AC Alcohol abuse Vaping Bipolar 1  Significant Hospital Events:  6/10 intubation, CVL, admission. Initial concern for lung injury related to vaping but patient significantly improved with aggressive diuresing which indication SOB, cough, and hemoptysis was likely secondary to severe volume overload in the setting of severe CHF with chronic anticoagulation  6/10 No acute events overnight, diuresed well with IV lasix yesterday. Vent setting much improved  6/11 Continue to diurese well with 2L removed yesterday. Pressor and sedation requirements also decreased  6/12 diuresis 6/13 extubated 6/14 refractory afib/ agitation leading to Vfib arrest x 1 min, reintubation; cardioversion to sinus 6/15 extubated, went back into Afib almost immediately on removal of ETT  Interim History / Subjective:  Did not sleep well despite using home CPAP. Remains in Afib.  Objective   Blood pressure (!) 151/133, pulse (!) 112, temperature 97.6 F (36.4 C), temperature source Axillary, resp. rate (!) 24, height 5\' 8"  (1.727 m), weight (!) 163.6 kg, SpO2 96 %. CVP:  [3 mmHg] 3 mmHg      Intake/Output Summary (Last 24 hours) at 05/16/2021 0730 Last data filed at 05/16/2021 0600 Gross per 24 hour  Intake 2049.86 ml  Output 685 ml  Net 1364.86 ml    Filed  Weights   05/14/21 0500 05/15/21 0400 05/16/21 0500  Weight: (!) 157.7 kg (!) 161.7 kg (!) 163.6 kg    Examination: Constitutional: chronically ill appearing man in NAD  Eyes: strabismus, pupils equal/reactive Ears, nose, mouth, and throat: MMM, trachea midline Cardiovascular: Irregular, tachycardic, ext warm Respiratory: diminished at bases, no wheezing or accessory muscle use Gastrointestinal: soft, +BS Skin: No rashes, normal turgor Neurologic: moves all 4 ext, weak Psychiatric: RASS 0, anxious     Labs/imaging that I havepersonally reviewed   6/10 CTA chest No PE Multifocal pneumonia. COVID-19 infection not excluded. Likely reactive right hilar and mediastinal lymph node. Cardiomegaly 6/10 ECHO EF less than 20%, with global hypokinesis  Coox 55%    Resolved Hospital Problem list   N/a  Assessment & Plan:  Acute hypoxemic respiratory failure- recurrent 6/14 in context of refractory afib, getting dilt and likely resultant poor flow; remains tenuous suspicion for afib causing cardiogenic shock on top of chronic severe NICM complicated by noncompliance. Refractory Afib- on amio drip, received multiple boluses and dig x 1,  CHF and EP following  Chronic NICM, question of EtOH induced: at dry weight Acute Kidney Injury - stabilized/improved Med compliance issues- was taking meds PRN at home apparently Hx of Afib/VTE- on heparin drip Severe OSA Hx of HTN/HLD DM2 with hyperglycemia- minimal SSI need History of anxiety and bipolar Early 6/14 Vfib/Vtach arrest- brief, in setting of extubation, afib, resp distress, inability to tolerate BIPAP; baseline severe NICM Fevers, question of PNA- started on ceftriaxone 6/14, Pct benign  - PAP qHS - TEE/cardioversion today.  Hopefully stays cardioverted this time and improves forward flow as well as many of his weakness/dyspnea which I suspect are related to low flow state - Keep in ICU, high risk for needing intubation again -  Continue heparin and amio drip - Will need AICD once we assure no infection - On ceftriaxone for ?fever and ?PNA, 5 days should be sufficient for this, I have pretty low suspicion - Replete K, keep mg/phjos WNL  Best practice   Diet:  cardiac vs. regular Pain/Anxiety/Delirium protocol (if indicated): No VAP protocol (if indicated): off DVT prophylaxis: heparin gtt GI prophylaxis: PPI Glucose control:  SSI No Central venous access:  Yes, and it is still needed Arterial line:  out Foley:  Yes, and it is still needed Mobility:  bed rest  PT consulted: N/A Last date of multidisciplinary goals of care discussion: Wife updated at bedside 6/15 and today Code Status:  full code Disposition: ICU pending respiratory and hemodynamic stability  34 minutes cc time not including any separately billable procedures Erskine Emery MD PCCM

## 2021-05-16 NOTE — Progress Notes (Signed)
Physical Therapy Evaluation Patient Details Name: [name] MRN: [id number] DOB: 07-08-1966 Today's Date: 05/16/2021   History of Present Illness  55 year old man admitted 6/10 who presented with shortness of breath. VDRF 6/10-6/15.  Post extubation with afib.  Cardioversion 6/16.PMH:  HTN, Bipolar, anxiety, Diabetes  Clinical Impression  Pt admitted with above diagnosis. Pt was able to take pivotal steps to chair with min guard assist and min assist for dynamic balance. States his wife will be home this summer therefore feel that with RW pt would be able to go home with wife with HHPT f/u. Will follow acutely.  Pt currently with functional limitations due to the deficits listed below (see PT Problem List). Pt will benefit from skilled PT to increase their independence and safety with mobility to allow discharge to the venue listed below.       Follow Up Recommendations Home health PT;Supervision/Assistance - 24 hour    Equipment Recommendations  Rolling walker with 5" wheels    Recommendations for Other Services       Precautions / Restrictions Precautions Precautions: Fall Restrictions Weight Bearing Restrictions: No      Mobility  Bed Mobility Overal bed mobility: Needs Assistance Bed Mobility: Supine to Sit     Supine to sit: Min guard     General bed mobility comments: min guard for safety. Pts left LE hanging off bed on arrival with pt trying to get to EOB with bed rail up to use urinal.    Transfers Overall transfer level: Needs assistance Equipment used: 2 person hand held assist Transfers: Sit to/from [employer] Transfers Sit to Stand: Min assist;+2 safety/equipment;From elevated surface Stand pivot transfers: Min guard;+2 safety/equipment       General transfer comment: Pt was able to stand but initially with posterior lean and took a little time for pt to get balance with pt with wide BOS.  Pt attempted to use urinal in standing but could not do  so with therapists standing with him and too unsafe to allow pt to stand alone. Pt took pivotal steps to chair with min guard HHA. Moves impulsively needing cues and steadying assist.  Ambulation/Gait                Stairs            Wheelchair Mobility    Modified Rankin (Stroke Patients Only)       Balance Overall balance assessment: Needs assistance Sitting-balance support: Bilateral upper extremity supported;Feet supported;No upper extremity supported Sitting balance-Leahy Scale: Fair     Standing balance support: Bilateral upper extremity supported;During functional activity Standing balance-Leahy Scale: Poor Standing balance comment: relies on UE support for steadying                             Pertinent Vitals/Pain Pain Assessment: No/denies pain    Home Living Family/patient expects to be discharged to:: Private residence Living Arrangements: Spouse/significant other Available Help at Discharge: Family;Available 24 hours/day (3 children) Type of Home: House Home Access: Stairs to enter Entrance Stairs-Rails: Right;Left;Can reach both Entrance Stairs-Number of Steps: 3 Home Layout: One level Home Equipment: None Additional Comments: Wife drives school bus but will be home for summer - works from backyard per pt    Prior Function Level of Independence: Independent         Comments: Works as DJ     Journalist, newspaper  Extremity/Trunk Assessment   Upper Extremity Assessment Upper Extremity Assessment: Defer to OT evaluation    Lower Extremity Assessment Lower Extremity Assessment: Generalized weakness    Cervical / Trunk Assessment Cervical / Trunk Assessment: Normal  Communication   Communication: No difficulties  Cognition Arousal/Alertness: Awake/alert Behavior During Therapy: Impulsive Overall Cognitive Status: Impaired/Different from baseline Area of Impairment: Safety/judgement;Problem solving                          Safety/Judgement: Decreased awareness of safety;Decreased awareness of deficits   Problem Solving: Slow processing;Requires verbal cues General Comments: Overall, did well cognitively but does have some impulsivity and poor safety awareness      General Comments General comments (skin integrity, edema, etc.): VSS on 3LO2.  Diastolic BP elevated with nurse aware.    Exercises     Assessment/Plan    PT Assessment Patient needs continued PT services  PT Problem List Decreased balance;Decreased activity tolerance;Decreased mobility;Decreased knowledge of use of DME;Decreased safety awareness;Decreased knowledge of precautions;Cardiopulmonary status limiting activity;Obesity       PT Treatment Interventions DME instruction;Gait training;Functional mobility training;Therapeutic activities;Therapeutic exercise;Balance training;Patient/family education;Stair training    PT Goals (Current goals can be found in the Care Plan section)  Acute Rehab PT Goals Patient Stated Goal: to go home PT Goal Formulation: With patient Time For Goal Achievement: 05/30/21 Potential to Achieve Goals: Good    Frequency Min 3X/week   Barriers to discharge        Co-evaluation               AM-PAC PT "6 Clicks" Mobility  Outcome Measure Help needed turning from your back to your side while in a flat bed without using bedrails?: None Help needed moving from lying on your back to sitting on the side of a flat bed without using bedrails?: None Help needed moving to and from a bed to a chair (including a wheelchair)?: A Little Help needed standing up from a chair using your arms (e.g., wheelchair or bedside chair)?: A Little Help needed to walk in hospital room?: A Little Help needed climbing 3-5 steps with a railing? : A Lot 6 Click Score: 19    End of Session Equipment Utilized During Treatment: Gait belt;Oxygen Activity Tolerance: Patient limited by fatigue Patient left: in  chair;with call bell/phone within reach;with chair alarm set Nurse Communication: Mobility status PT Visit Diagnosis: Unsteadiness on feet (R26.81);Muscle weakness (generalized) (M62.81)    Time: 1638-4665 PT Time Calculation (min) (ACUTE ONLY): 29 min   Charges:   PT Evaluation $PT Eval Moderate Complexity: 1 Mod          Joyceline Maiorino M,PT Acute Rehab Services [phone] (pager)   Alvira Philips 05/16/2021, 12:58 PM

## 2021-05-16 NOTE — Progress Notes (Signed)
  Speech Language Pathology Treatment: Dysphagia  Patient Details Name: [name] MRN: [id number] DOB: [birth year] Today's Date: 05/16/2021 Time: 8295-6213 SLP Time Calculation (min) (ACUTE ONLY): 22.43 min  Assessment / Plan / Recommendation Clinical Impression  Pt was seen for dysphagia treatment with his wife present. He was alert and cooperative during the session, but demonstrated intermittent confusion. Pt's responses were inconsistently unrelated to questions and pt provided many contradictions during the session. Mastication was notably prolonged and persisted beyond the point of necessity. Pt required liquid boluses and cues to swallow some boluses of solids and he reported that he was fearful of swallowing due to the possibility of it "sticking". Pt demonstrated delayed coughing following multiple boluses of salad and he reported that it was due to it "getting stuck". Pt initially reported that this symptom is new today, subsequently stated that he had this symptom yesterday with the graham cracker, and then denied having any difficulty swallow solids yesterday. Tangential tendencies were noted during the session and pt expressed that he has a "new lease on life" and would like to drink liquids only in an effort to loose weight, not due to any functional difficulty swallowing solids. Pt's change in presentation was discussed with Judson Roch, RN who indicated that she anticipated some of these behaviors closer to completion of the cardioversion, but that she will monitor him closely and follow up with Dr. Tamala Julian if confusion persists. SLP will continue to follow pt to assess need for further intervention if pt's dysphagia symptoms persist.    HPI HPI: Pt is a 55 y/o male who presented with shortness of breath. In ER, received some fluids and got worse. Pt was placed on biPAP but had frank hemoptysis and was intubated for airway protection. ETT 6/10-6/13; 6/13-6/14.  A fib RVR- VT arrest 6/14. Pt  failed [facility] since he stopped drinking. Pt s/p TEE & cardioversion 6/16. CXR 6/14: Diffuse bilateral patchy opacities, could reflect edema, atelectasis and/or airspace disease.      SLP Plan  Continue with current plan of care       Recommendations  Diet recommendations: Regular;Thin liquid Liquids provided via: Cup;Straw Medication Administration: Whole meds with liquid Supervision: Patient able to self feed                Oral Care Recommendations: Oral care BID Follow up Recommendations: None SLP Visit Diagnosis: Dysphagia, unspecified (R13.10) Plan: Continue with current plan of care       Trice Aspinall I. Hardin Negus, [place], [place] Office number [phone] Pager [place] 05/16/2021, 2:04 PM

## 2021-05-16 NOTE — Progress Notes (Signed)
RT present at bedside for TEE and cardioversion. Pt placed on 6L end tidal C02  [place]. No complications noted. Pt tolerated well, will continue to monitor.

## 2021-05-16 NOTE — CV Procedure (Signed)
Procedure: TEE  Indication: Atrial fibrillation  Sedation: Etomidate per anesthesiology  Findings: Please see echo section for full report.  Due to coughing/gagging, limited TEE was done.   Mildly dilated left atrium with no LA appendage thrombus.  Normal right atrium.  The left ventricle was severely dilated with EF < 20%.  No LV thrombus noted.  The right ventricle was normal in size with mildly decreased systolic function.  There was mild mitral regurgitation.  Aortic valve not fully visualized due to limited study, mild AI and no significant AS.  The ascending aorta was normal in caliber.   May proceed with TEE.   [name] 05/16/2021 9:50 AM

## 2021-05-16 NOTE — Interval H&P Note (Signed)
History and Physical Interval Note:  05/16/2021 9:25 AM  [name]  has presented today for surgery, with the diagnosis of * No surgery found *.  The various methods of treatment have been discussed with the patient and family. After consideration of risks, benefits and other options for treatment, the patient has consented to  * No surgery found * as a surgical intervention.  The patient's history has been reviewed, patient examined, no change in status, stable for surgery.  I have reviewed the patient's chart and labs.  Questions were answered to the patient's satisfaction.     Oluwatosin Higginson [employer]

## 2021-05-16 NOTE — Progress Notes (Signed)
Advanced Heart Failure Rounding Note  PCP-Cardiologist: Fransico Him, MD   Subjective:   Over the weekend diuresed with IV lasix.  6/10 Intubated. Diuresed.  6/13 Extubated 6/14 A fib RVR- VT arrest . Urgent cardioversion. Extubated.  Placed on amio drip.  6/16 TEE-guided DCCV.  Limited TEE due to coughing/gagging, EF <20% with severe LV dilation, mildly decreased RV systolic function, no LA thrombus. DCCV successful to NSR.   Co-ox 55% today with CVP 8-9.  Creatinine stable at 1.4.   Tired.   Objective:   Weight Range: (!) 163.6 kg Body mass index is 54.84 kg/m.   Vital Signs:   Temp:  [97.2 F (36.2 C)-98.4 F (36.9 C)] 97.6 F (36.4 C) (06/16 0700) Pulse Rate:  [74-198] 82 (06/16 0949) Resp:  [13-37] 26 (06/16 0949) BP: (87-156)/(49-140) 151/133 (06/16 0600) SpO2:  [84 %-100 %] 99 % (06/16 0949) Weight:  [163.6 kg] 163.6 kg (06/16 0500) Last BM Date:  (PTA)  Weight change: Filed Weights   05/14/21 0500 05/15/21 0400 05/16/21 0500  Weight: (!) 157.7 kg (!) 161.7 kg (!) 163.6 kg    Intake/Output:   Intake/Output Summary (Last 24 hours) at 05/16/2021 0955 Last data filed at 05/16/2021 0600 Gross per 24 hour  Intake 1935.2 ml  Output 685 ml  Net 1250.2 ml      Physical Exam   CVP 8-9 cm General: NAD Neck: Thick, JVP difficult, no thyromegaly or thyroid nodule.  Lungs: Clear to auscultation bilaterally with normal respiratory effort. CV: Nondisplaced PMI.  Heart tachy, irregular S1/S2, no S3/S4, no murmur.  No peripheral edema.   Abdomen: Soft, nontender, no hepatosplenomegaly, no distention.  Skin: Intact without lesions or rashes.  Neurologic: Alert and oriented x 3.  Psych: Normal affect. Extremities: No clubbing or cyanosis.  HEENT: Normal.    Telemetry   A fib RVR 120-140s => NSR 90s with PVCs (personally reviewed) EKG  N/a  Labs    CBC Recent Labs    05/14/21 0301 05/14/21 0333 05/15/21 0436 05/15/21 2342  WBC 13.8*   < > 12.4*  12.1*  NEUTROABS 8.2*  --   --   --   HGB 16.6   < > 16.4 16.6  HCT 47.7   < > 47.9 48.1  MCV 85.6   < > 86.3 84.8  PLT 280   < > 231 264   < > = values in this interval not displayed.   Basic Metabolic Panel Recent Labs    05/14/21 1648 05/14/21 1704 05/15/21 0436 05/16/21 0322  NA 144   < > 147* 143  K 3.6   < > 3.8 3.7  CL 102  --  106 107  CO2 29  --  28 27  GLUCOSE 192*  --  143* 159*  BUN 54*  --  47* 39*  CREATININE 1.81*  --  1.55* 1.40*  CALCIUM 8.9  --  8.7* 8.9  MG 3.1*  --  3.1*  --   PHOS 3.3  --  3.1  --    < > = values in this interval not displayed.   Liver Function Tests Recent Labs    05/14/21 0301 05/14/21 0600  AST 83* 65*  ALT 65* 63*  ALKPHOS 136* 138*  BILITOT 3.0* 2.2*  PROT 8.2* 8.3*  ALBUMIN 3.2* 3.2*   No results for input(s): LIPASE, AMYLASE in the last 72 hours. Cardiac Enzymes No results for input(s): CKTOTAL, CKMB, CKMBINDEX, TROPONINI in the last 72 hours.  BNP: BNP (last 3 results) Recent Labs    05/10/21 0551  BNP 464.3*    ProBNP (last 3 results) No results for input(s): PROBNP in the last 8760 hours.   D-Dimer No results for input(s): DDIMER in the last 72 hours. Hemoglobin A1C No results for input(s): HGBA1C in the last 72 hours. Fasting Lipid Panel Recent Labs    05/14/21 0226  TRIG 221*   Thyroid Function Tests No results for input(s): TSH, T4TOTAL, T3FREE, THYROIDAB in the last 72 hours.  Invalid input(s): FREET3  Other results:   Imaging    DG Chest 1 View  Result Date: 05/16/2021 CLINICAL DATA:  Respiratory failure EXAM: CHEST  1 VIEW COMPARISON:  05/14/2021 FINDINGS: Endotracheal tube has been removed in the interval. Right jugular line is again identified and stable. Cardiac shadow remains enlarged. Lungs are well aerated bilaterally without focal infiltrate or sizable effusion. IMPRESSION: Improved aeration bilaterally.  No acute abnormality noted. Electronically Signed   By: Inez Catalina M.D.    On: 05/16/2021 08:26   VAS [place] LOWER EXTREMITY VENOUS (DVT)  Result Date: 05/15/2021  Lower Venous DVT Study Patient Name:  [name]  Date of Exam:   05/15/2021 Medical Rec #: [id number]       Accession #:    [id number] Date of Birth: [birth year]      Patient Gender: M Patient Age:   54Y Exam Location:  [facility] Procedure:      VAS [place] LOWER EXTREMITY VENOUS (DVT) Referring Phys: [id number] Candee Furbish --------------------------------------------------------------------------------  Indications: Edema, and Swelling.  Comparison Study: prior 11/22/18 Performing Technologist: Archie Patten RVS  Examination Guidelines: A complete evaluation includes B-mode imaging, spectral Doppler, color Doppler, and power Doppler as needed of all accessible portions of each vessel. Bilateral testing is considered an integral part of a complete examination. Limited examinations for reoccurring indications may be performed as noted. The reflux portion of the exam is performed with the patient in reverse Trendelenburg.  +-----+---------------+---------+-----------+----------+--------------+ RIGHTCompressibilityPhasicitySpontaneityPropertiesThrombus Aging +-----+---------------+---------+-----------+----------+--------------+ CFV  Full           Yes      Yes                                 +-----+---------------+---------+-----------+----------+--------------+   +---------+---------------+---------+-----------+----------+--------------+ LEFT     CompressibilityPhasicitySpontaneityPropertiesThrombus Aging +---------+---------------+---------+-----------+----------+--------------+ CFV      Full           Yes      Yes                                 +---------+---------------+---------+-----------+----------+--------------+ SFJ      Full                                                        +---------+---------------+---------+-----------+----------+--------------+ FV Prox  Full                                                         +---------+---------------+---------+-----------+----------+--------------+ FV Mid   Full                                                        +---------+---------------+---------+-----------+----------+--------------+  FV DistalFull                                                        +---------+---------------+---------+-----------+----------+--------------+ PFV      Full                                                        +---------+---------------+---------+-----------+----------+--------------+ POP      Full           Yes      Yes                                 +---------+---------------+---------+-----------+----------+--------------+ PTV      Full                                                        +---------+---------------+---------+-----------+----------+--------------+ PERO     Full                                                        +---------+---------------+---------+-----------+----------+--------------+     Summary: RIGHT: - No evidence of common femoral vein obstruction.  LEFT: - There is no evidence of deep vein thrombosis in the lower extremity.  - No cystic structure found in the popliteal fossa.  *See table(s) above for measurements and observations. Electronically signed by Harold Barban MD on 05/15/2021 at 7:51:11 PM.    Final      Medications:     Scheduled Medications:  ARIPiprazole  10 mg Oral Daily   atorvastatin  40 mg Oral Daily   chlorhexidine  15 mL Mouth Rinse BID   Chlorhexidine Gluconate Cloth  6 each Topical Daily   digoxin  0.125 mg Oral Daily   docusate sodium  100 mg Oral BID   feeding supplement  237 mL Oral TID WC   gabapentin  100 mg Oral TID   insulin aspart  0-20 Units Subcutaneous Q4H   mouth rinse  15 mL Mouth Rinse q12n4p   metoprolol succinate  25 mg Oral Daily   polyethylene glycol  17 g Oral Daily   sertraline  100 mg Oral Daily   sodium  chloride flush  10-40 mL Intracatheter Q12H   thiamine  100 mg Oral Daily   vitamin B-12  1,000 mcg Oral Daily    Infusions:  sodium chloride     amiodarone 60 mg/hr (05/16/21 0809)   cefTRIAXone (ROCEPHIN)  IV Stopped (05/15/21 1845)   heparin 2,750 Units/hr (05/16/21 0808)   norepinephrine (LEVOPHED) Adult infusion 2 mcg/min (05/14/21 1500)   potassium chloride 10 mEq (05/16/21 0917)    PRN Medications: sodium chloride, acetaminophen, clonazePAM, ipratropium-albuterol, ondansetron (ZOFRAN) IV, oxyCODONE-acetaminophen, sodium chloride flush   Assessment/Plan   1. Atrial fibrillation: With RVR.  Patient has history  of PAF. He was admitted with AF/RVR, this may have triggered CHF exacerbation, ?tachy-mediated.  TEE-guided DCCV this morning, now back in NSR.  - Continue amiodarone gtt today.   - Continue heparin gtt for now, eventually back to Xarelto.   - Continue Toprol XL 25 daily.  - Continue digoxin.  2.  Acute on chronic systolic CHF: Nonischemic cardiomyopathy. ?If ETOH has played a role (now drinks rarely). Echo (3/18) with EF 25-30%. He was seen by EP and decided against ICD given marked obesity.  QRS not wide enough for CRT. Echo 7/19 and in 2/21 with EF 20-25%. Echo this admission with EF < 20%, normal RV.  Admitted with AF/RVR and CHF exacerbation.  CVP now 8-9, co-ox 55%. He was cardioverted back to NSR this morning.  - Lasix 40 mg IV x 1.  - Holding spironolactone, losartan, dapagliflozin with soft BP.  Suspect we can restart soon.  - No entresto with h/o angioedema.  - Continue digoxin as above.  - Toprol XL 25 mg daily.  - Headaches with Bidil, cannot take.  - Need to keep in NSR as above.  3. PNA: Diffuse bilateral infiltrates on CXR.  - Ceftriaxone.  4. VT arrest: 6/13, progression from AF/RVR.  Has been turned down for ICD due to size.   - Continue amiodarone.  - EP has seen.  5. OSA: Continue nightly BiPAP. 6. PE: 04/14/2018, diagnosed by V/Q scan. Has been on  Xarelto.  7. Anxiety/Panic attacks/depression: Followed by psychiatry. 8. Hyperlipidemia: Continue atorvastatin.   CRITICAL CARE Performed by: [name]  Total critical care time: 35 minutes  Critical care time was exclusive of separately billable procedures and treating other patients.  Critical care was necessary to treat or prevent imminent or life-threatening deterioration.  Critical care was time spent personally by me on the following activities: development of treatment plan with patient and/or surrogate as well as nursing, discussions with consultants, evaluation of patient's response to treatment, examination of patient, obtaining history from patient or surrogate, ordering and performing treatments and interventions, ordering and review of laboratory studies, ordering and review of radiographic studies, pulse oximetry and re-evaluation of patient's condition.  [name] 05/16/2021 9:55 AM

## 2021-05-16 NOTE — Progress Notes (Signed)
ANTICOAGULATION CONSULT NOTE - Follow Up Consult  Pharmacy Consult for IV Heparin Indication: atrial fibrillation  Allergies  Allergen Reactions   Ace Inhibitors Anaphylaxis and Swelling    Angioedema   Bidil [Isosorb Dinitrate-Hydralazine] Other (See Comments)    headache   Digoxin And Related     Unspecified "side effects"   Buspirone Other (See Comments)    dizziness    Patient Measurements: Height: 5\' 8"  (172.7 cm) Weight: (!) 163.6 kg (360 lb 10.8 oz) IBW/kg (Calculated) : 68.4 Heparin Dosing Weight: 111 kg  Vital Signs: Temp: 97.2 F (36.2 C) (06/16 0336) Temp Source: Axillary (06/16 0336) BP: 151/133 (06/16 0600) Pulse Rate: 112 (06/16 0600)  Labs: Recent Labs    05/14/21 0301 05/14/21 0333 05/14/21 0600 05/14/21 0804 05/14/21 1648 05/14/21 1704 05/14/21 1705 05/15/21 0100 05/15/21 0436 05/15/21 0519 05/15/21 1516 05/15/21 2342 05/16/21 0322  HGB 16.6   < >  --   --  16.3 16.0  --   --  16.4  --   --  16.6  --   HCT 47.7   < >  --   --  46.7 47.0  --   --  47.9  --   --  48.1  --   PLT 280  --   --   --  242  --   --   --  231  --   --  264  --   APTT  --   --   --    < >  --   --  33   < >  --   --  57* 69* 86*  HEPARINUNFRC  --   --   --    < >  --   --  0.43  --   --  0.56  --   --  1.02*  CREATININE 1.80*  --   --   --  1.81*  --   --   --  1.55*  --   --   --  1.40*  TROPONINIHS 170*  --  198*  --   --   --   --   --   --   --   --   --   --    < > = values in this interval not displayed.     Estimated Creatinine Clearance: 93 mL/min (A) (by C-G formula based on SCr of 1.4 mg/dL (H)).   Medications:  Infusions:   sodium chloride     amiodarone 60 mg/hr (05/16/21 0600)   cefTRIAXone (ROCEPHIN)  IV Stopped (05/15/21 1845)   heparin 2,750 Units/hr (05/16/21 0600)   norepinephrine (LEVOPHED) Adult infusion 2 mcg/min (05/14/21 1500)    Assessment: 55 years of age male transitioned for Xarelto to IV Heparin in case of procedures.    Utilizing aPTT at this time due to recent Xarelto causing falsely elevated heparin levels.   Confirmatory aPTT remains therapeutic at 86, on drip rate 2750 units/hr. CBC last night stable, wnl. No overt bleeding or infusion issues noted. For TEE-DCCV this morning.  Goal of Therapy:  Heparin level 0.3-0.7 units/ml aPTT 66-102 seconds Monitor platelets by anticoagulation protocol: Yes   Plan:  Continue heparin infusion at 2750 units/hr Monitor daily aPTT/HL, CBC, s/sx bleeding  Richardine Service, PharmD, BCPS PGY2 Cardiology Pharmacy Resident Phone: [phone] 05/16/2021  7:22 AM  Please check [email or web address] for unit-specific pharmacy phone numbers.

## 2021-05-17 LAB — POCT I-STAT 7, (LYTES, BLD GAS, ICA,H+H)
Acid-Base Excess: 2 mmol/L (ref 0.0–2.0)
Bicarbonate: 27.4 mmol/L (ref 20.0–28.0)
Calcium, Ion: 1.24 mmol/L (ref 1.15–1.40)
HCT: 45 % (ref 39.0–52.0)
Hemoglobin: 15.3 g/dL (ref 13.0–17.0)
O2 Saturation: 96 %
Patient temperature: 98.6
Potassium: 3.7 mmol/L (ref 3.5–5.1)
Sodium: 145 mmol/L (ref 135–145)
TCO2: 29 mmol/L (ref 22–32)
pCO2 arterial: 46.7 mmHg (ref 32.0–48.0)
pH, Arterial: 7.377 (ref 7.350–7.450)
pO2, Arterial: 83 mmHg (ref 83.0–108.0)

## 2021-05-17 LAB — BASIC METABOLIC PANEL
Anion gap: 11 (ref 5–15)
BUN: 36 mg/dL — ABNORMAL HIGH (ref 6–20)
CO2: 25 mmol/L (ref 22–32)
Calcium: 9 mg/dL (ref 8.9–10.3)
Chloride: 106 mmol/L (ref 98–111)
Creatinine, Ser: 1.42 mg/dL — ABNORMAL HIGH (ref 0.61–1.24)
GFR, Estimated: 59 mL/min — ABNORMAL LOW (ref >60)
Glucose, Bld: 129 mg/dL — ABNORMAL HIGH (ref 70–99)
Potassium: 3.8 mmol/L (ref 3.5–5.1)
Sodium: 142 mmol/L (ref 135–145)

## 2021-05-17 LAB — CBC
HCT: 47 % (ref 39.0–52.0)
Hemoglobin: 16.4 g/dL (ref 13.0–17.0)
MCH: 29.5 pg (ref 26.0–34.0)
MCHC: 34.9 g/dL (ref 30.0–36.0)
MCV: 84.5 fL (ref 80.0–100.0)
Platelets: 241 10*3/uL (ref 150–400)
RBC: 5.56 MIL/uL (ref 4.22–5.81)
RDW: 13.7 % (ref 11.5–15.5)
WBC: 8.5 10*3/uL (ref 4.0–10.5)
nRBC: 0 % (ref 0.0–0.2)

## 2021-05-17 LAB — COOXEMETRY PANEL
Carboxyhemoglobin: 1.1 % (ref 0.5–1.5)
Methemoglobin: 0.8 % (ref 0.0–1.5)
O2 Saturation: 66 %
Total hemoglobin: 16.7 g/dL — ABNORMAL HIGH (ref 12.0–16.0)

## 2021-05-17 LAB — GLUCOSE, CAPILLARY
Glucose-Capillary: 128 mg/dL — ABNORMAL HIGH (ref 70–99)
Glucose-Capillary: 166 mg/dL — ABNORMAL HIGH (ref 70–99)
Glucose-Capillary: 128 mg/dL — ABNORMAL HIGH (ref 70–99)
Glucose-Capillary: 124 mg/dL — ABNORMAL HIGH (ref 70–99)
Glucose-Capillary: 115 mg/dL — ABNORMAL HIGH (ref 70–99)
Glucose-Capillary: 115 mg/dL — ABNORMAL HIGH (ref 70–99)

## 2021-05-17 LAB — HEPARIN LEVEL (UNFRACTIONATED): Heparin Unfractionated: 0.88 IU/mL — ABNORMAL HIGH (ref 0.30–0.70)

## 2021-05-17 LAB — APTT: aPTT: 80 seconds — ABNORMAL HIGH (ref 24–36)

## 2021-05-17 MED ORDER — FUROSEMIDE 10 MG/ML IJ SOLN
40.0000 mg | Freq: Two times a day (BID) | INTRAMUSCULAR | Status: AC
  Administered 2021-05-17 (×2): 40 mg via INTRAVENOUS
  Filled 2021-05-17 (×2): qty 4

## 2021-05-17 MED ORDER — AMIODARONE HCL 200 MG PO TABS
200.0000 mg | ORAL_TABLET | Freq: Two times a day (BID) | ORAL | Status: DC
  Administered 2021-05-17 – 2021-05-19 (×5): 200 mg via ORAL
  Filled 2021-05-17 (×5): qty 1

## 2021-05-17 MED ORDER — POTASSIUM CHLORIDE CRYS ER 20 MEQ PO TBCR
40.0000 meq | EXTENDED_RELEASE_TABLET | Freq: Once | ORAL | Status: AC
  Administered 2021-05-17: 40 meq via ORAL
  Filled 2021-05-17: qty 2

## 2021-05-17 MED ORDER — BUTAMBEN-TETRACAINE-BENZOCAINE 2-2-14 % EX AERO
1.0000 | INHALATION_SPRAY | Freq: Once | CUTANEOUS | Status: DC
  Filled 2021-05-17 (×2): qty 20

## 2021-05-17 MED ORDER — SPIRONOLACTONE 12.5 MG HALF TABLET
12.5000 mg | ORAL_TABLET | Freq: Every day | ORAL | Status: DC
  Administered 2021-05-17 – 2021-05-19 (×3): 12.5 mg via ORAL
  Filled 2021-05-17 (×3): qty 1

## 2021-05-17 NOTE — Progress Notes (Signed)
  Speech Language Pathology Treatment: Dysphagia  Patient Details Name: [name] MRN: [id number] DOB: 1966/01/06 Today's Date: 05/17/2021 Time: 0802-2336 SLP Time Calculation (min) (ACUTE ONLY): 12.4 min  Assessment / Plan / Recommendation Clinical Impression  Pt was seen for dysphagia treatment and was cooperative throughout the session. Pt, and his wife reported that the pt's tolerance of the current diet has improved and that he has not required cues to swallow. Pt reported that he ate fruit last night without significant difficulty, but still has some sensation of "sticking" s/p TEE. Pt tolerated puree solids, regular texture solids, and thin liquids via cup and straw using consecutive swallows without symptoms of aspiration. Mastication and oral clearance were adequate, and pt reported that the sensation of pharyngeal residue is eliminated with a liquid wash. This symptom has developed since the TEE and SLP anticipates that this will improve with time, but has advised the pt and his wife to let his MD if improvement is not observed. Pt's current diet of regular texture solids and thin liquids will be continued. Further skilled SLP services are not clinically indicated at this time.    HPI HPI: Pt is a 55 y/o male who presented with shortness of breath. In ER, received some fluids and got worse. Pt was placed on biPAP but had frank hemoptysis and was intubated for airway protection. ETT 6/10-6/13; 6/13-6/14.  A fib RVR- VT arrest 6/14. Pt failed [facility] since he stopped drinking. Pt s/p TEE & cardioversion 6/16. CXR 6/14: Diffuse bilateral patchy opacities, could reflect edema, atelectasis and/or airspace disease.      SLP Plan  All goals met;Discharge SLP treatment due to (comment)       Recommendations  Diet recommendations: Regular;Thin liquid Liquids provided via: Cup;Straw Medication Administration: Whole meds with liquid Supervision: Patient able to self feed                 Oral Care Recommendations: Oral care BID Follow up Recommendations: None SLP Visit Diagnosis: Dysphagia, unspecified (R13.10) Plan: All goals met;Discharge SLP treatment due to (comment)       [name] I. Hardin Negus, [place], [place] Office number [phone] Pager [place] 05/17/2021, 10:29 AM

## 2021-05-17 NOTE — Progress Notes (Addendum)
Telemetry reviewed. Maintaining SR/SB 50's-80's Dr. Rayann Heman has discussed case with Dr. Aundra Dubin, no plans for ICD. His comments/discussion to follow.  Tommye Standard, PA-C  [name] and I have discussed and agree that the patient is not an ICD candidate given size and infectious risks. Plan is for amiodarone as long term therapy for afib and VT.  Ep to see as needed as per my discussion with [name].  Please call with questions.  Thompson Grayer MD, [place] 05/17/2021

## 2021-05-17 NOTE — Consult Note (Signed)
[facility] Nurse Consult Note: Patient receiving care in [facility] 2M08 Reason for Consult: Right posterior thigh wound Wound type: Blister that has ruptured now is pink and moist.  Pressure Injury POA: NA Measurement: approx. 3 x 3 Wound bed: Pink moist Dressing procedure/placement/frequency: Place a small piece of Xeroform gauze on the wound and secure with foam dressing. Change the Xeroform gauze daily.   Monitor the wound area(s) for worsening of condition such as: Signs/symptoms of infection, increase in size, development of or worsening of odor, development of pain, or increased pain at the affected locations.   Notify the medical team if any of these develop.  Thank you for the consult. [facility] nurse will not follow at this time.   Please re-consult the [facility] team if needed.  Cathlean Marseilles Tamala Julian, MSN, RN, [place], Lysle Pearl, [facility] Wound Treatment Associate Pager [phone]

## 2021-05-17 NOTE — Progress Notes (Signed)
eLink Physician-Brief Progress Note Patient Name: [name] DOB: [birth year] MRN: [id number]   Date of Service  05/17/2021  HPI/Events of Note  Patient has a sore throat.  eICU Interventions  Cetacaine spray 1 puff to the posterior pharynx  tonight.        Kerry Kass [name] 05/17/2021, 9:50 PM

## 2021-05-17 NOTE — Progress Notes (Signed)
ANTICOAGULATION CONSULT NOTE - Follow Up Consult  Pharmacy Consult for IV Heparin Indication: atrial fibrillation  Allergies  Allergen Reactions   Ace Inhibitors Anaphylaxis and Swelling    Angioedema   Bidil [Isosorb Dinitrate-Hydralazine] Other (See Comments)    headache   Digoxin And Related     Unspecified "side effects"   Buspirone Other (See Comments)    dizziness    Patient Measurements: Height: 5\' 8"  (172.7 cm) Weight: (!) 167.6 kg (369 lb 7.9 oz) IBW/kg (Calculated) : 68.4 Heparin Dosing Weight: 111 kg  Vital Signs: Temp: 96.4 F (35.8 C) (06/17 0335) Temp Source: Axillary (06/17 0335) BP: 105/61 (06/17 0600) Pulse Rate: 48 (06/17 0600)  Labs: Recent Labs    05/15/21 0436 05/15/21 0519 05/15/21 1516 05/15/21 2342 05/16/21 0322 05/17/21 0103 05/17/21 0328  HGB 16.4  --   --  16.6  --  16.4 15.3  HCT 47.9  --   --  48.1  --  47.0 45.0  PLT 231  --   --  264  --  241  --   APTT  --   --    < > 69* 86* 80*  --   HEPARINUNFRC  --  0.56  --   --  1.02* 0.88*  --   CREATININE 1.55*  --   --   --  1.40* 1.42*  --    < > = values in this interval not displayed.     Estimated Creatinine Clearance: 93 mL/min (A) (by C-G formula based on SCr of 1.42 mg/dL (H)).   Medications:  Infusions:   sodium chloride     amiodarone Stopped (05/17/21 0317)   cefTRIAXone (ROCEPHIN)  IV Stopped (05/16/21 1759)   heparin 2,750 Units/hr (05/17/21 0600)   norepinephrine (LEVOPHED) Adult infusion 2 mcg/min (05/14/21 1500)    Assessment: 55 years of age male transitioned for Xarelto to IV Heparin in case of procedures. S/p successful TEE-DCCV on 6/16.  Utilizing aPTT at this time due to recent Xarelto causing falsely elevated heparin levels.   Confirmatory aPTT remains therapeutic at 80, on drip rate 2750 units/hr. CBC stable, wnl. No overt bleeding or infusion issues noted.   Goal of Therapy:  Heparin level 0.3-0.7 units/ml aPTT 66-102 seconds Monitor platelets by  anticoagulation protocol: Yes   Plan:  Continue heparin infusion at 2750 units/hr Monitor daily aPTT/HL, CBC, s/sx bleeding Will plan to switch to [place], PharmD, [place] PGY2 Cardiology Pharmacy Resident Phone: [phone] 05/17/2021  7:03 AM  Please check [email or web address] for unit-specific pharmacy phone numbers.

## 2021-05-17 NOTE — Progress Notes (Signed)
[place] Progress Note Patient Name: [name] DOB: July 22, 1966 MRN: [id number]   Date of Service  05/17/2021  HPI/Events of Note  ABG = 7.37/46.7/83/27.4  eICU Interventions  Continue present management.      Intervention Category Major Interventions: Other:  Lysle Dingwall 05/17/2021, 3:35 AM

## 2021-05-17 NOTE — Progress Notes (Signed)
Placed patient on home BIPAP for the night

## 2021-05-17 NOTE — Progress Notes (Addendum)
NAME:  JANIS CUFFE, MRN:  [id number], DOB:  1966/04/04, LOS: 7 ADMISSION DATE:  05/09/2021, CONSULTATION DATE:  05/10/21 REFERRING MD:  EDP, CHIEF COMPLAINT:  SOB, chest pain   History of Present Illness:  55 year old man who presented with shortness of breath. This has been refractory to bronchodilators.  In ER, received some fluids and got worse.  Placed on BIPAP but began having possible frank hemoptysis so intubated for airway protection.  CTA read as multifocal pneumonia although this was underwhelming on my read.  Currently on high vent requirements and PCCM to admit.  Pertinent  Medical History  Metabolic syndrome CHF Afib and prior VTE on AC Alcohol abuse Vaping Bipolar 1  Significant Hospital Events:  6/10 intubation, CVL, admission. Initial concern for lung injury related to vaping but patient significantly improved with aggressive diuresing which indication SOB, cough, and hemoptysis was likely secondary to severe volume overload in the setting of severe CHF with chronic anticoagulation  6/10 No acute events overnight, diuresed well with IV lasix yesterday. Vent setting much improved  6/11 Continue to diurese well with 2L removed yesterday. Pressor and sedation requirements also decreased  6/12 diuresis 6/13 extubated 6/14 refractory afib/ agitation leading to Vfib arrest x 1 min, reintubation; cardioversion to sinus 6/15 extubated, went back into Afib almost immediately on removal of ETT 6/17 no further arrhythmias, making slow progress   Interim History / Subjective:  No acute issues overnight  States he feels much better today with improved strength   Objective   Blood pressure 105/61, pulse (!) 48, temperature (!) 96.4 F (35.8 C), temperature source Axillary, resp. rate (!) 23, height 5\' 8"  (1.727 m), weight (!) 167.6 kg, SpO2 95 %. CVP:  [9 mmHg] 9 mmHg      Intake/Output Summary (Last 24 hours) at 05/17/2021 0913 Last data filed at 05/17/2021 0800 Gross per 24  hour  Intake 2027.67 ml  Output 950 ml  Net 1077.67 ml    Filed Weights   05/15/21 0400 05/16/21 0500 05/17/21 0500  Weight: (!) 161.7 kg (!) 163.6 kg (!) 167.6 kg    Examination: General: Very pleasant middle aged male sitting up in bed preparing to work with PT HEENT: [place]/AT, MM pink/moist, PERRL,  Neuro: Alert and oriented x3, non-focal  CV: s1s2 regular rate and rhythm, no murmur, rubs, or gallops,  PULM:  Clear to ascultation bilaterally, no added breath sounds, on RA GI: soft, bowel sounds active in all 4 quadrants, non-tender, non-distended Extremities: warm/dry, no edema  Skin: no rashes or lesions  Labs/imaging that I havepersonally reviewed   6/10 CTA chest No PE Multifocal pneumonia. COVID-19 infection not excluded. Likely reactive right hilar and mediastinal lymph node. Cardiomegaly 6/10 ECHO EF less than 20%, with global hypokinesis Coox 66% 6/17  Resolved Hospital Problem list   Acute hypoxemic respiratory failure Hemoptysis and peri-bronchovascular ground lass opacities   Assessment & Plan:  Chronic NICM -Near his dry weight, follow by heart failure clinic EF on ECHO 02/22 EF 20-25% with global hypokinesis Refractory A-fib with Hx of chronic A-fib -Underwent electrical cardioversion with Dr. Aundra Dubin 6/17 -Chronically anticoagulated on Xarelto  Vfib/Vtach arrest -6/14 brief, in setting of extubation, afib, resp distress, inability to tolerate BIPAP Hx of HTN/HLD,   -Home medications include metoprolol, Lipitor, Cozaar, spirolactone, and Demadex P: HF following, appreciate assistance Strict intake and output  Continuous telemetry  Continue BID IV Lasix Optimize electrolytes  Continue PO Amiodarone and Digoxin   Remains on heparin  drip HF plans to convert back to Xarelto 6/18  Severe OSA P: Nocturnal CPAP Pulmonary hygiene  Mobilize as able  Continue BDs   Acute Kidney Injury - improving  -Creatinine 0.99 01/2021, creatinine on admission 1.42 with up  trended to 2.27 P: Follow renal function  Monitor urine output Trend Bmet Avoid nephrotoxins Ensure adequate renal perfusion  Enteral hydration   DM2 with hyperglycemia -Home medications include Farxiga P:  Continue SSI CBG checks ACHS CBG goal 140-180   History of anxiety and bipolar -Home medications include Abilify, Klonopin, and Zoloft P: Continue home Klonopin   Fevers, question of PNA -Started on ceftriaxone 6/14, Pct benign -Fever curve now normal with no other continued clinical signs of PNA P: Stop antibiotics  Trend CBC and fever curve   Best practice   Diet:  cardiac vs. regular Pain/Anxiety/Delirium protocol (if indicated): No VAP protocol (if indicated): off DVT prophylaxis: heparin gtt GI prophylaxis: PPI Glucose control:  SSI No Central venous access:  Yes, and it is still needed Arterial line:  out Foley:  Yes, and it is still needed Mobility:  bed rest  PT consulted: N/A Last date of multidisciplinary goals of care discussion: Wife updated at bedside 6/15 and today Code Status:  full code Disposition: ICU pending respiratory and hemodynamic stability   Signature   Johnsie Cancel, NP-C [facility] Personal contact information can be found on Amion  05/17/2021, 9:39 AM    Attending Note:  I have examined patient, reviewed labs, studies and notes.   55 year old man with a cardiomyopathy who was admitted with bilateral infiltrates and respiratory failure.  Initially treated for possible pneumonia and aggressively diuresed.  Blood-tinged respiratory secretions consistent with CHF.  Required mechanical ventilation and extubated post diuresis unfortunate course complicated by dysrhythmia, atrial fibrillation and then VF arrest.  Urgently reintubated and cardioverted.  Again extubated 6/15 and was back in atrial fibrillation with some overall hemodynamic and respiratory decompensation requiring BiPAP.  He underwent TEE and repeat  cardioversion on 6/16, successfully back to normal sinus rhythm.  Had some bradycardia overnight so his amiodarone infusion and metoprolol were transiently held.  Currently hemodynamically stable and in sinus.  He has been able to wean off BiPAP.  He is much improved and has been able to wean to room air.  Passed swallowing evaluation today.  Vitals:   05/17/21 0400 05/17/21 0500 05/17/21 0600 05/17/21 1113  BP: 102/72 (!) 89/72 105/61   Pulse: (!) 48 (!) 58 (!) 48   Resp: 16 18 (!) 23   Temp:    97.7 F (36.5 C)  TempSrc:    Oral  SpO2: (!) 85% 95% 95%   Weight:  (!) 167.6 kg    Height:       Obese man, laying in bed, comfortable, currently on room air.  He is awake, interacting, following commands and is well oriented.  He answers questions appropriately.  Strong voice.  No secretions.  Lungs decreased to both bases without any wheezes or crackles.  Heart is regular, borderline bradycardic 58.  Sinus bradycardia on monitor.  Abdomen nondistended with positive bowel sounds.  No significant lower extremity edema.  No rash.  Acute on chronic systolic CHF, significant impact of acute atrial fibrillation and tachycardia superimposed on his known nonischemic cardiomyopathy.  He is now back in sinus rhythm and looks much improved.  His amiodarone is being transitioned to p.o.  Lasix increased this morning to twice daily.  Remains on  heparin infusion.  Cardiology managing.  Plan to follow his chest x-ray with further diuresis.  His ceftriaxone is been discontinued, suspicion for pneumonia decreased given his clinical improvement with cardiac stabilization.  Severe OSA.  He is on BiPAP at home at baseline.  No longer requiring during the day.  Plan to continue nightly.  Acute renal failure due to the above.  Follow his BMP and urine output closely with aggressive diuresis.  Diabetes mellitus with hyperglycemia.  Continue his current sign scale insulin.  History of anxiety and depression, tolerating  clonazepam  Disposition: He looks markedly improved.  If he remains hemodynamically stable, remains in sinus rhythm then he may be able to move out of the ICU this afternoon 6/17.  We will recheck him to see if this is possible.  Baltazar Apo, MD, PhD 05/17/2021, 11:45 AM [facility] [phone] or if no answer [phone]

## 2021-05-17 NOTE — Progress Notes (Signed)
eLink Physician-Brief Progress Note Patient Name: [name] DOB: September 23, 1966 MRN: [id number]   Date of Service  05/17/2021  HPI/Events of Note  Multiple issues: 1. Nursing concern about patient being more lethargic. However, able to follow commands and neuro exam is non focal. Blood glucose = 128. Has not received any narcotics or sedatives according to nurse. 2. HR = 48.   eICU Interventions  Plan: ABG STAT. Hold Amiodarone IV infusion until HR > 60 x 1 hours, then restart IV infusion at present rate. Hold 10 AM Metoprolol dose until patient is re-evaluated by the PCCM rounding team.      Intervention Category Major Interventions: Change in mental status - evaluation and management;Arrhythmia - evaluation and management  [name] 05/17/2021, 3:19 AM

## 2021-05-17 NOTE — Progress Notes (Signed)
Patient ID: [name], male   DOB: 31-Mar-1966, 55 y.o.   MRN: [id number]     Advanced Heart Failure Rounding Note  PCP-Cardiologist: Fransico Him, MD   Subjective:   Over the weekend diuresed with IV lasix.  6/10 Intubated. Diuresed.  6/13 Extubated 6/14 A fib RVR- VT arrest . Urgent cardioversion. Extubated.  Placed on amio drip.  6/16 TEE-guided DCCV.  Limited TEE due to coughing/gagging, EF <20% with severe LV dilation, mildly decreased RV systolic function, no LA thrombus. DCCV successful to NSR.   Bradycardic to 50s overnight, IV amiodarone stopped.  He remains in NSR. CVP 10-11, no co-ox yet.  On Bipap.    Objective:   Weight Range: (!) 163.6 kg Body mass index is 54.84 kg/m.   Vital Signs:   Temp:  [96.4 F (35.8 C)-97.7 F (36.5 C)] 96.4 F (35.8 C) (06/17 0335) Pulse Rate:  [47-154] 48 (06/17 0400) Resp:  [15-42] 16 (06/17 0400) BP: (94-157)/(68-114) 102/72 (06/17 0400) SpO2:  [85 %-100 %] 85 % (06/17 0400) Last BM Date:  (pta)  Weight change: Filed Weights   05/14/21 0500 05/15/21 0400 05/16/21 0500  Weight: (!) 157.7 kg (!) 161.7 kg (!) 163.6 kg    Intake/Output:   Intake/Output Summary (Last 24 hours) at 05/17/2021 0625 Last data filed at 05/17/2021 0400 Gross per 24 hour  Intake 1495.83 ml  Output 400 ml  Net 1095.83 ml      Physical Exam   CVP 10-11 cm General: NAD Neck: Thick, JVP difficult, no thyromegaly or thyroid nodule.  Lungs: Clear to auscultation bilaterally with normal respiratory effort. CV: Nondisplaced PMI.  Heart regular S1/S2, no S3/S4, no murmur.  Trace ankle edema.  Abdomen: Soft, nontender, no hepatosplenomegaly, no distention.  Skin: Intact without lesions or rashes.  Neurologic: Alert and oriented x 3.  Psych: Normal affect. Extremities: No clubbing or cyanosis.  HEENT: Normal.    Telemetry   NSR 50s (personally reviewed) EKG  N/a  Labs    CBC Recent Labs    05/15/21 2342 05/17/21 0103 05/17/21 0328   WBC 12.1* 8.5  --   HGB 16.6 16.4 15.3  HCT 48.1 47.0 45.0  MCV 84.8 84.5  --   PLT 264 241  --    Basic Metabolic Panel Recent Labs    05/14/21 1648 05/14/21 1704 05/15/21 0436 05/16/21 0322 05/17/21 0103 05/17/21 0328  NA 144   < > 147* 143 142 145  K 3.6   < > 3.8 3.7 3.8 3.7  CL 102  --  106 107 106  --   CO2 29  --  28 27 25   --   GLUCOSE 192*  --  143* 159* 129*  --   BUN 54*  --  47* 39* 36*  --   CREATININE 1.81*  --  1.55* 1.40* 1.42*  --   CALCIUM 8.9  --  8.7* 8.9 9.0  --   MG 3.1*  --  3.1*  --   --   --   PHOS 3.3  --  3.1  --   --   --    < > = values in this interval not displayed.   Liver Function Tests No results for input(s): AST, ALT, ALKPHOS, BILITOT, PROT, ALBUMIN in the last 72 hours.  No results for input(s): LIPASE, AMYLASE in the last 72 hours. Cardiac Enzymes No results for input(s): CKTOTAL, CKMB, CKMBINDEX, TROPONINI in the last 72 hours.  BNP: BNP (last 3 results)  Recent Labs    05/10/21 0551  BNP 464.3*    ProBNP (last 3 results) No results for input(s): PROBNP in the last 8760 hours.   D-Dimer No results for input(s): DDIMER in the last 72 hours. Hemoglobin A1C No results for input(s): HGBA1C in the last 72 hours. Fasting Lipid Panel No results for input(s): CHOL, HDL, LDLCALC, TRIG, CHOLHDL, LDLDIRECT in the last 72 hours.  Thyroid Function Tests No results for input(s): TSH, T4TOTAL, T3FREE, THYROIDAB in the last 72 hours.  Invalid input(s): FREET3  Other results:   Imaging    DG Chest 1 View  Result Date: 05/16/2021 CLINICAL DATA:  Respiratory failure EXAM: CHEST  1 VIEW COMPARISON:  05/14/2021 FINDINGS: Endotracheal tube has been removed in the interval. Right jugular line is again identified and stable. Cardiac shadow remains enlarged. Lungs are well aerated bilaterally without focal infiltrate or sizable effusion. IMPRESSION: Improved aeration bilaterally.  No acute abnormality noted. Electronically Signed   By:  Inez Catalina M.D.   On: 05/16/2021 08:26     Medications:     Scheduled Medications:  amiodarone  200 mg Oral BID   ARIPiprazole  10 mg Oral Daily   atorvastatin  40 mg Oral Daily   chlorhexidine  15 mL Mouth Rinse BID   Chlorhexidine Gluconate Cloth  6 each Topical Daily   digoxin  0.125 mg Oral Daily   docusate sodium  100 mg Oral BID   feeding supplement  237 mL Oral TID WC   furosemide  40 mg Intravenous BID   gabapentin  100 mg Oral TID   insulin aspart  0-20 Units Subcutaneous Q4H   mouth rinse  15 mL Mouth Rinse q12n4p   metoprolol succinate  25 mg Oral Daily   polyethylene glycol  17 g Oral Daily   potassium chloride  40 mEq Oral Once   sertraline  100 mg Oral Daily   sodium chloride flush  10-40 mL Intracatheter Q12H   spironolactone  12.5 mg Oral Daily   thiamine  100 mg Oral Daily   vitamin B-12  1,000 mcg Oral Daily    Infusions:  sodium chloride     amiodarone Stopped (05/17/21 0317)   cefTRIAXone (ROCEPHIN)  IV Stopped (05/16/21 1759)   heparin 2,750 Units/hr (05/17/21 0438)   norepinephrine (LEVOPHED) Adult infusion 2 mcg/min (05/14/21 1500)    PRN Medications: sodium chloride, acetaminophen, clonazePAM, ipratropium-albuterol, ondansetron (ZOFRAN) IV, oxyCODONE-acetaminophen, sodium chloride flush   Assessment/Plan   1. Atrial fibrillation: With RVR.  Patient has history of PAF. He was admitted with AF/RVR, this may have triggered CHF exacerbation, ?tachy-mediated.  TEE-guided DCCV 6/16, now back in NSR.  - Transition to amiodarone 200 mg bid.   - Continue heparin gtt for now, eventually back to Xarelto => make change tomorrow.   - D/c Toprol XL with bradycardia.  - Continue digoxin.  2.  Acute on chronic systolic CHF: Nonischemic cardiomyopathy. ?If ETOH has played a role (now drinks rarely). Echo (3/18) with EF 25-30%. He was seen by EP and decided against ICD given marked obesity.  QRS not wide enough for CRT. Echo 7/19 and in 2/21 with EF 20-25%.  Echo this admission with EF < 20%, normal RV.  Admitted with AF/RVR and CHF exacerbation.  CVP now 10-11, co-ox pending.   - Lasix 40 mg IV bid today.  - Restart spironolactone 12.5 daily.  - Stop Toprol XL with bradycardia.  - Holding losartan, dapagliflozin with soft BP.  Suspect we can  restart soon.  - No entresto with h/o angioedema.  - Continue digoxin as above.  - Headaches with Bidil, cannot take.  - Need to keep in NSR as above.  3. PNA: Diffuse bilateral infiltrates on CXR.  - Ceftriaxone.  4. VT arrest: 6/13, progression from AF/RVR.  Has been turned down for ICD due to size.   - Amiodarone.  - EP has seen.  5. OSA: Continue nightly BiPAP. 6. PE: 04/14/2018, diagnosed by V/Q scan. Has been on Xarelto.  7. Anxiety/Panic attacks/depression: Followed by psychiatry. 8. Hyperlipidemia: Continue atorvastatin.   [name] 05/17/2021 6:25 AM
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
## 2021-05-18 DIAGNOSIS — G4733 Obstructive sleep apnea (adult) (pediatric): Secondary | ICD-10-CM

## 2021-05-18 DIAGNOSIS — I480 Paroxysmal atrial fibrillation: Secondary | ICD-10-CM

## 2021-05-18 LAB — COOXEMETRY PANEL
Carboxyhemoglobin: 1.1 % (ref 0.5–1.5)
Methemoglobin: 0.7 % (ref 0.0–1.5)
O2 Saturation: 62.9 %
Total hemoglobin: 15.5 g/dL (ref 12.0–16.0)

## 2021-05-18 LAB — CBC
HCT: 45.4 % (ref 39.0–52.0)
Hemoglobin: 15.9 g/dL (ref 13.0–17.0)
MCH: 29.6 pg (ref 26.0–34.0)
MCHC: 35 g/dL (ref 30.0–36.0)
MCV: 84.4 fL (ref 80.0–100.0)
Platelets: 213 10*3/uL (ref 150–400)
RBC: 5.38 MIL/uL (ref 4.22–5.81)
RDW: 13.6 % (ref 11.5–15.5)
WBC: 9.5 10*3/uL (ref 4.0–10.5)
nRBC: 0 % (ref 0.0–0.2)

## 2021-05-18 LAB — GLUCOSE, CAPILLARY
Glucose-Capillary: 123 mg/dL — ABNORMAL HIGH (ref 70–99)
Glucose-Capillary: 125 mg/dL — ABNORMAL HIGH (ref 70–99)
Glucose-Capillary: 127 mg/dL — ABNORMAL HIGH (ref 70–99)
Glucose-Capillary: 115 mg/dL — ABNORMAL HIGH (ref 70–99)
Glucose-Capillary: 166 mg/dL — ABNORMAL HIGH (ref 70–99)
Glucose-Capillary: 76 mg/dL (ref 70–99)

## 2021-05-18 LAB — BASIC METABOLIC PANEL
Anion gap: 9 (ref 5–15)
BUN: 29 mg/dL — ABNORMAL HIGH (ref 6–20)
CO2: 26 mmol/L (ref 22–32)
Calcium: 9 mg/dL (ref 8.9–10.3)
Chloride: 102 mmol/L (ref 98–111)
Creatinine, Ser: 1.21 mg/dL (ref 0.61–1.24)
GFR, Estimated: >60 mL/min (ref >60)
Glucose, Bld: 123 mg/dL — ABNORMAL HIGH (ref 70–99)
Potassium: 3.9 mmol/L (ref 3.5–5.1)
Sodium: 137 mmol/L (ref 135–145)

## 2021-05-18 LAB — APTT: aPTT: 95 seconds — ABNORMAL HIGH (ref 24–36)

## 2021-05-18 LAB — HEPARIN LEVEL (UNFRACTIONATED): Heparin Unfractionated: 0.99 IU/mL — ABNORMAL HIGH (ref 0.30–0.70)

## 2021-05-18 MED ORDER — LOSARTAN POTASSIUM 25 MG PO TABS
25.0000 mg | ORAL_TABLET | Freq: Every day | ORAL | Status: DC
  Administered 2021-05-18 – 2021-05-19 (×2): 25 mg via ORAL
  Filled 2021-05-18 (×2): qty 1

## 2021-05-18 MED ORDER — RIVAROXABAN 20 MG PO TABS
20.0000 mg | ORAL_TABLET | Freq: Every day | ORAL | Status: DC
  Administered 2021-05-18: 20 mg via ORAL
  Filled 2021-05-18: qty 1

## 2021-05-18 MED ORDER — TORSEMIDE 20 MG PO TABS
20.0000 mg | ORAL_TABLET | Freq: Every day | ORAL | Status: DC
  Administered 2021-05-19: 20 mg via ORAL
  Filled 2021-05-18: qty 1

## 2021-05-18 MED ORDER — FUROSEMIDE 10 MG/ML IJ SOLN
40.0000 mg | Freq: Two times a day (BID) | INTRAMUSCULAR | Status: DC
  Administered 2021-05-18: 40 mg via INTRAVENOUS
  Filled 2021-05-18: qty 4

## 2021-05-18 NOTE — Progress Notes (Signed)
ANTICOAGULATION CONSULT NOTE - Follow Up Consult  Pharmacy Consult for IV Heparin Indication: atrial fibrillation  Allergies  Allergen Reactions   Ace Inhibitors Anaphylaxis and Swelling    Angioedema   Bidil [Isosorb Dinitrate-Hydralazine] Other (See Comments)    headache   Digoxin And Related     Unspecified "side effects"   Buspirone Other (See Comments)    dizziness    Patient Measurements: Height: 5\' 8"  (172.7 cm) Weight: (!) 167 kg (368 lb 2.7 oz) IBW/kg (Calculated) : 68.4 Heparin Dosing Weight: 111 kg  Vital Signs: Temp: 97.6 F (36.4 C) (06/18 0738) Temp Source: Oral (06/18 0738) BP: 152/96 (06/18 0600) Pulse Rate: 48 (06/18 0600)  Labs: Recent Labs    05/15/21 2342 05/16/21 0322 05/17/21 0103 05/17/21 0328 05/18/21 0137 05/18/21 0355  HGB 16.6  --  16.4 15.3 15.9  --   HCT 48.1  --  47.0 45.0 45.4  --   PLT 264  --  241  --  213  --   APTT 69* 86* 80*  --  95*  --   HEPARINUNFRC  --  1.02* 0.88*  --  0.99*  --   CREATININE  --  1.40* 1.42*  --   --  1.21     Estimated Creatinine Clearance: 108.9 mL/min (by C-G formula based on SCr of 1.21 mg/dL).   Medications:  Infusions:   sodium chloride     heparin 2,750 Units/hr (05/18/21 9381)    Assessment: 55 years of age male transitioned for Xarelto to IV Heparin in case of procedures. S/p successful TEE-DCCV on 6/16.  Utilizing aPTT at this time due to recent Xarelto causing falsely elevated heparin levels.   Confirmatory aPTT remains therapeutic at 95, on drip rate 2750 units/hr. CBC stable, wnl. No overt bleeding or infusion issues noted.   Goal of Therapy:  Heparin level 0.3-0.7 units/ml aPTT 66-102 seconds Monitor platelets by anticoagulation protocol: Yes   Plan:  Continue heparin infusion at 2750 units/hr Monitor daily aPTT/HL, CBC, s/sx bleeding Pending switch to [place], PharmD, BCCCP Clinical Pharmacist [phone]  Please check AMION for all [place]  numbers  05/18/2021 10:43 AM

## 2021-05-18 NOTE — Progress Notes (Signed)
NAME:  [name], MRN:  [id number], DOB:  10-20-1966, LOS: 8 ADMISSION DATE:  05/09/2021, CONSULTATION DATE:  05/10/21 REFERRING MD:  EDP, CHIEF COMPLAINT:  SOB, chest pain   History of Present Illness:  55 year old man who presented with shortness of breath. This has been refractory to bronchodilators.  In ER, received some fluids and got worse.  Placed on BIPAP but began having possible frank hemoptysis so intubated for airway protection.  CTA read as multifocal pneumonia although this was underwhelming on my read.  Currently on high vent requirements and PCCM to admit.  Pertinent  Medical History  Metabolic syndrome CHF Afib and prior VTE on AC Alcohol abuse Vaping Bipolar 1  Significant Hospital Events:  6/10 intubation, CVL, admission. Initial concern for lung injury related to vaping but patient significantly improved with aggressive diuresing which indication SOB, cough, and hemoptysis was likely secondary to severe volume overload in the setting of severe CHF with chronic anticoagulation  6/10 No acute events overnight, diuresed well with IV lasix yesterday. Vent setting much improved  6/11 Continue to diurese well with 2L removed yesterday. Pressor and sedation requirements also decreased  6/12 diuresis 6/13 extubated 6/14 refractory afib/ agitation leading to Vfib arrest x 1 min, reintubation; cardioversion to sinus 6/15 extubated, went back into Afib almost immediately on removal of ETT 6/17 no further arrhythmias, making slow progress   Interim History / Subjective:  Sore throat overnight I/O -4L total Co-ox 63% S Cr 1.42 >> 1.21   Objective   Blood pressure (!) 152/96, pulse (!) 48, temperature 97.6 F (36.4 C), temperature source Oral, resp. rate 17, height 5\' 8"  (1.727 m), weight (!) 167 kg, SpO2 97 %.        Intake/Output Summary (Last 24 hours) at 05/18/2021 0740 Last data filed at 05/18/2021 0427 Gross per 24 hour  Intake 1526.6 ml  Output 3015 ml  Net  -1488.4 ml   Filed Weights   05/16/21 0500 05/17/21 0500 05/18/21 0500  Weight: (!) 163.6 kg (!) 167.6 kg (!) 167 kg    Examination: General: pleasant obese man, no distress in bed HEENT: Op clear, chronic disconjugate gaze,  Neuro: awake, alert, appropriate, moves all ext CV: regular, high 50's, sinus brady on telemetry PULM:  decreased both bases, no crackles or wheezes GI: non-distended, + BS Extremities:no edema Skin: no rash  Labs/imaging that I havepersonally reviewed   6/10 CTA chest No PE Multifocal pneumonia. COVID-19 infection not excluded. Likely reactive right hilar and mediastinal lymph node. Cardiomegaly 6/10 ECHO EF less than 20%, with global hypokinesis Coox 66% 6/17 Coox 63% on 6/18  Resolved Hospital Problem list   Acute hypoxemic respiratory failure Hemoptysis and peri-bronchovascular ground lass opacities   Assessment & Plan:  Chronic NICM -Near his dry weight, follow by heart failure clinic EF on ECHO 02/22 EF 20-25% with global hypokinesis Refractory A-fib with Hx of chronic A-fib -Underwent electrical cardioversion with Dr. Aundra Dubin 6/17 -Chronically anticoagulated on Xarelto, currently heparin Vfib/Vtach arrest -6/14 brief, in setting of extubation, afib, resp distress, inability to tolerate BIPAP Hx of HTN/HLD,   -Home medications include metoprolol, Lipitor, Cozaar, spirolactone, and Demadex P: -Appreciate advanced CHF team management and EP evaluation -Remains on heparin infusion, plan back to Xarelto when okay with cardiology -Amiodarone and digoxin p.o. -Diuretics as ordered, following blood pressure and renal function -No plans for ICD placement per EP notes -Continue to optimize electrolytes  Severe OSA P: -Continue his home BiPAP nightly -Push mobility -  d/c BD's   Acute Kidney Injury - improving  P: -Follow BMP, urine output with diuresis -Dosing furosemide daily  DM2 with hyperglycemia -Home medications include Farxiga P:   -Sliding-scale insulin -Home Farxiga when stabilized   History of anxiety and bipolar -Home medications include Abilify, Klonopin, and Zoloft P: -Continue home Klonopin Abilify Zoloft  Fevers, question of PNA -Started on ceftriaxone 6/14, Pct benign -Fever curve now normal with no other continued clinical signs of PNA P: -Following off antibiotics  Best practice   Diet:  cardiac  Pain/Anxiety/Delirium protocol (if indicated): No VAP protocol (if indicated): off DVT prophylaxis: heparin gtt GI prophylaxis: PPI Glucose control:  SSI No Central venous access:  Yes, and it is still needed Arterial line:  out Foley:  Yes, and it is still needed Mobility:  bed rest  PT consulted: N/A Last date of multidisciplinary goals of care discussion: Wife updated at bedside 6/17 Code Status:  full code Disposition: Transfer to telemetry 6/18   Signature   Baltazar Apo, MD, PhD 05/18/2021, 11:17 AM [facility] [phone] or if no answer before 7:00PM call [phone] For any issues after 7:00PM please call eLink [phone]

## 2021-05-18 NOTE — Plan of Care (Signed)

## 2021-05-18 NOTE — Progress Notes (Signed)
Patient ID: [name], male   DOB: 1966/10/03, 55 y.o.   MRN: [id number]     Advanced Heart Failure Rounding Note  PCP-Cardiologist: Fransico Him, MD   Subjective:   Over the weekend diuresed with IV lasix.  6/10 Intubated. Diuresed.  6/13 Extubated 6/14 A fib RVR- VT arrest . Urgent cardioversion. Extubated.  Placed on amio drip.  6/16 TEE-guided DCCV.  Limited TEE due to coughing/gagging, EF <20% with severe LV dilation, mildly decreased RV systolic function, no LA thrombus. DCCV successful to NSR.   Maintaining NSR on po amio. HRs 50-60s. Co-ox 63% Diuresing well on lasix 40 IV bid. CVP not set up.  BP high   Walking with PT. Wants to go home.   Objective:   Weight Range: (!) 167 kg Body mass index is 55.98 kg/m.   Vital Signs:   Temp:  [97.1 F (36.2 C)-97.9 F (36.6 C)] 97.5 F (36.4 C) (06/18 1145) Pulse Rate:  [48-66] 48 (06/18 0600) Resp:  [13-26] 17 (06/18 0600) BP: (92-152)/(72-98) 152/96 (06/18 0600) SpO2:  [95 %-100 %] 97 % (06/18 0600) Weight:  [466 kg] 167 kg (06/18 0500) Last BM Date:  (pta)  Weight change: Filed Weights   05/16/21 0500 05/17/21 0500 05/18/21 0500  Weight: (!) 163.6 kg (!) 167.6 kg (!) 167 kg    Intake/Output:   Intake/Output Summary (Last 24 hours) at 05/18/2021 1259 Last data filed at 05/18/2021 1100 Gross per 24 hour  Intake 849.6 ml  Output 2290 ml  Net -1440.4 ml       Physical Exam   General:  Sitting up in chair. No resp difficulty HEENT: normal Neck: supple. RIJ TLC Carotids 2+ bilat; no bruits. No lymphadenopathy or thryomegaly appreciated. Cor: PMI nondisplaced. Regular rate & rhythm. No rubs, gallops or murmurs. Lungs: clear Abdomen: obese soft, nontender, nondistended. No hepatosplenomegaly. No bruits or masses. Good bowel sounds. Extremities: no cyanosis, clubbing, rash, edema Neuro: alert & orientedx3, cranial nerves grossly intact. moves all 4 extremities w/o difficulty. Affect pleasant  Telemetry    NSR 50-60 (personally reviewed)  Labs    CBC Recent Labs    05/17/21 0103 05/17/21 0328 05/18/21 0137  WBC 8.5  --  9.5  HGB 16.4 15.3 15.9  HCT 47.0 45.0 45.4  MCV 84.5  --  84.4  PLT 241  --  599    Basic Metabolic Panel Recent Labs    05/17/21 0103 05/17/21 0328 05/18/21 0355  NA 142 145 137  K 3.8 3.7 3.9  CL 106  --  102  CO2 25  --  26  GLUCOSE 129*  --  123*  BUN 36*  --  29*  CREATININE 1.42*  --  1.21  CALCIUM 9.0  --  9.0    Liver Function Tests No results for input(s): AST, ALT, ALKPHOS, BILITOT, PROT, ALBUMIN in the last 72 hours.  No results for input(s): LIPASE, AMYLASE in the last 72 hours. Cardiac Enzymes No results for input(s): CKTOTAL, CKMB, CKMBINDEX, TROPONINI in the last 72 hours.  BNP: BNP (last 3 results) Recent Labs    05/10/21 0551  BNP 464.3*     ProBNP (last 3 results) No results for input(s): PROBNP in the last 8760 hours.   D-Dimer No results for input(s): DDIMER in the last 72 hours. Hemoglobin A1C No results for input(s): HGBA1C in the last 72 hours. Fasting Lipid Panel No results for input(s): CHOL, HDL, LDLCALC, TRIG, CHOLHDL, LDLDIRECT in the last 72 hours.  Thyroid Function Tests No results for input(s): TSH, T4TOTAL, T3FREE, THYROIDAB in the last 72 hours.  Invalid input(s): FREET3  Other results:   Imaging    No results found.   Medications:     Scheduled Medications:  amiodarone  200 mg Oral BID   ARIPiprazole  10 mg Oral Daily   atorvastatin  40 mg Oral Daily   butamben-tetracaine-benzocaine  1 spray Topical Once   chlorhexidine  15 mL Mouth Rinse BID   Chlorhexidine Gluconate Cloth  6 each Topical Daily   digoxin  0.125 mg Oral Daily   docusate sodium  100 mg Oral BID   feeding supplement  237 mL Oral TID WC   furosemide  40 mg Intravenous BID   gabapentin  100 mg Oral TID   insulin aspart  0-20 Units Subcutaneous Q4H   mouth rinse  15 mL Mouth Rinse q12n4p   polyethylene glycol  17  g Oral Daily   sertraline  100 mg Oral Daily   sodium chloride flush  10-40 mL Intracatheter Q12H   spironolactone  12.5 mg Oral Daily   thiamine  100 mg Oral Daily   vitamin B-12  1,000 mcg Oral Daily    Infusions:  sodium chloride     heparin 2,750 Units/hr (05/18/21 0958)    PRN Medications: sodium chloride, acetaminophen, clonazePAM, ondansetron (ZOFRAN) IV, oxyCODONE-acetaminophen, sodium chloride flush   Assessment/Plan   1. Atrial fibrillation: With RVR.  Patient has history of PAF. He was admitted with AF/RVR, this may have triggered CHF exacerbation, ?tachy-mediated.  TEE-guided DCCV 6/16, now back in NSR.  - Continue amiodarone 200 mg bid.   - Switch heparin back to Xarelto  - Off digoxin and Toprol XL with bradycardia.  2.  Acute on chronic systolic CHF: Nonischemic cardiomyopathy. ?If ETOH has played a role (now drinks rarely). Echo (3/18) with EF 25-30%. He was seen by EP and decided against ICD given marked obesity.  QRS not wide enough for CRT. Echo 7/19 and in 2/21 with EF 20-25%. Echo this admission with EF < 20%, normal RV.  Admitted with AF/RVR and CHF exacerbation.  Diuresing well. CVP not hooked up.  co-ox 63%  - volume status looks good. Stop IV lasix. Resume home diuretics.  - Continue spironolactone 12.5 daily.  - Off digoxin and Toprol XL with bradycardia.  - Restart losartan today. Likely can restart Farxiga soon  - No entresto with h/o angioedema.  - Headaches with Bidil, cannot take.  - Need to keep in NSR as above.  3. PNA: Diffuse bilateral infiltrates on CXR.  - Ceftriaxone.  4. VT arrest: 6/13, progression from AF/RVR.  Has been turned down for ICD due to size.   - EP has seen. Continue amio for AF and VT  5. OSA: Continue nightly BiPAP. 6. PE: 04/14/2018, diagnosed by V/Q scan. Has been on Xarelto.  7. Anxiety/Panic attacks/depression: Followed by psychiatry. 8. Hyperlipidemia: Continue atorvastatin.   Glori Bickers MD 05/18/2021 12:59  PM

## 2021-05-18 NOTE — Progress Notes (Signed)
Occupational Therapy Treatment Patient Details Name: [name] MRN: [id number] DOB: 15-Aug-1966 Today's Date: 05/18/2021    History of present illness 55 year old man admitted 6/10 who presented with shortness of breath. VDRF 6/10-6/15.  Post extubation with afib.  Cardioversion 6/16.PMH:  HTN, Bipolar, anxiety, Diabetes   OT comments  Continues to make progress toward goals. OT treatment session with focus on household and community mobility in prep for return to PLOF. Patient ambulated up to 115ft with bariatric RW and up to 137ft without AD and Min guard. Patient very motivated. States that his wedding anniversary is tomorrow. OT will continue to follow acutely. Likely to continue to progress. May progress beyond need for [place].   Follow Up Recommendations  Home health OT;Supervision/Assistance - 24 hour    Equipment Recommendations  None recommended by OT    Recommendations for Other Services      Precautions / Restrictions Precautions Precautions: Fall Restrictions Weight Bearing Restrictions: No       Mobility Bed Mobility Overal bed mobility: Needs Assistance             General bed mobility comments: Patient standing upon entry. RN notified. Chair alarm plugged in but not on.    Transfers Overall transfer level: Needs assistance Equipment used: None;Rolling walker (2 wheeled) Transfers: Sit to/from Stand Sit to Stand: Supervision         General transfer comment: Supervision A for sit to stand from low recliner.    Balance Overall balance assessment: Needs assistance Sitting-balance support: Bilateral upper extremity supported;Feet supported;No upper extremity supported Sitting balance-Leahy Scale: Fair   Postural control: Posterior lean Standing balance support: Bilateral upper extremity supported;During functional activity Standing balance-Leahy Scale: Fair Standing balance comment: Walked more than 1/2 61M loop without AD and no overt LOB.                            ADL either performed or assessed with clinical judgement   ADL Overall ADL's : Needs assistance/impaired                                       General ADL Comments: Declined ADLs this date. Reports already completing oral hygiene/face washing.     Vision       Perception     Praxis      Cognition Arousal/Alertness: Awake/alert Behavior During Therapy: Impulsive Overall Cognitive Status: Impaired/Different from baseline Area of Impairment: Safety/judgement;Problem solving                         Safety/Judgement: Decreased awareness of safety;Decreased awareness of deficits   Problem Solving: Slow processing;Requires verbal cues General Comments: Improved impulsivity and safety awareness.        Exercises     Shoulder Instructions       General Comments VSS on RA.    Pertinent Vitals/ Pain          Home Living                                          Prior Functioning/Environment              Frequency  Min 2X/week        Progress  Toward Goals  OT Goals(current goals can now be found in the care plan section)  Progress towards OT goals: Progressing toward goals  Acute Rehab OT Goals Patient Stated Goal: to go home OT Goal Formulation: With patient Time For Goal Achievement: 05/30/21 Potential to Achieve Goals: Good ADL Goals Pt Will Perform Grooming: with modified independence;standing Pt Will Perform Upper Body Dressing: sitting;with modified independence Pt Will Perform Lower Body Dressing: with modified independence;sitting/lateral leans Pt Will Transfer to Toilet: with modified independence;ambulating Pt Will Perform Toileting - Clothing Manipulation and hygiene: with modified independence;sit to/from stand;sitting/lateral leans  Plan Discharge plan remains appropriate;Frequency remains appropriate    Co-evaluation                 AM-PAC OT "6 Clicks"  Daily Activity     Outcome Measure   Help from another person eating meals?: None Help from another person taking care of personal grooming?: A Little Help from another person toileting, which includes using toliet, bedpan, or urinal?: A Little Help from another person bathing (including washing, rinsing, drying)?: A Lot Help from another person to put on and taking off regular upper body clothing?: A Little Help from another person to put on and taking off regular lower body clothing?: A Lot 6 Click Score: 17    End of Session Equipment Utilized During Treatment: Gait belt  OT Visit Diagnosis: Unsteadiness on feet (R26.81);Muscle weakness (generalized) (M62.81)   Activity Tolerance Patient tolerated treatment well   Patient Left in chair;with call bell/phone within reach;with chair alarm set   Nurse Communication Mobility status;Other (comment) (Response to treatment)        Time: 1157-2620 OT Time Calculation (min): 15 min  Charges: OT General Charges $OT Visit: 1 Visit OT Treatments $Therapeutic Activity: 8-22 mins  Urho Rio H. OTR/L Supplemental OT, Department of rehab services [phone]   Elain Wixon R H. 05/18/2021, 2:07 PM

## 2021-05-19 DIAGNOSIS — I10 Essential (primary) hypertension: Secondary | ICD-10-CM

## 2021-05-19 DIAGNOSIS — I5023 Acute on chronic systolic (congestive) heart failure: Secondary | ICD-10-CM | POA: Diagnosis not present

## 2021-05-19 DIAGNOSIS — I480 Paroxysmal atrial fibrillation: Secondary | ICD-10-CM | POA: Diagnosis not present

## 2021-05-19 LAB — CBC
HCT: 44.1 % (ref 39.0–52.0)
Hemoglobin: 15.6 g/dL (ref 13.0–17.0)
MCH: 29.7 pg (ref 26.0–34.0)
MCHC: 35.4 g/dL (ref 30.0–36.0)
MCV: 83.8 fL (ref 80.0–100.0)
Platelets: 206 10*3/uL (ref 150–400)
RBC: 5.26 MIL/uL (ref 4.22–5.81)
RDW: 13.5 % (ref 11.5–15.5)
WBC: 8.4 10*3/uL (ref 4.0–10.5)
nRBC: 0 % (ref 0.0–0.2)

## 2021-05-19 LAB — BASIC METABOLIC PANEL
Anion gap: 10 (ref 5–15)
BUN: 25 mg/dL — ABNORMAL HIGH (ref 6–20)
CO2: 27 mmol/L (ref 22–32)
Calcium: 9.3 mg/dL (ref 8.9–10.3)
Chloride: 100 mmol/L (ref 98–111)
Creatinine, Ser: 1.31 mg/dL — ABNORMAL HIGH (ref 0.61–1.24)
GFR, Estimated: >60 mL/min (ref >60)
Glucose, Bld: 92 mg/dL (ref 70–99)
Potassium: 4 mmol/L (ref 3.5–5.1)
Sodium: 137 mmol/L (ref 135–145)

## 2021-05-19 LAB — GLUCOSE, CAPILLARY
Glucose-Capillary: 91 mg/dL (ref 70–99)
Glucose-Capillary: 93 mg/dL (ref 70–99)
Glucose-Capillary: 97 mg/dL (ref 70–99)

## 2021-05-19 LAB — HEPARIN LEVEL (UNFRACTIONATED): Heparin Unfractionated: >1.1 IU/mL — ABNORMAL HIGH (ref 0.30–0.70)

## 2021-05-19 LAB — APTT: aPTT: 29 seconds (ref 24–36)

## 2021-05-19 LAB — MAGNESIUM: Magnesium: 2 mg/dL (ref 1.7–2.4)

## 2021-05-19 MED ORDER — SPIRONOLACTONE 25 MG PO TABS: 12.5000 mg | ORAL_TABLET | Freq: Every day | ORAL | 1 refills | Status: DC

## 2021-05-19 MED ORDER — TORSEMIDE 20 MG PO TABS: 20.0000 mg | ORAL_TABLET | Freq: Every day | ORAL | 1 refills | Status: DC

## 2021-05-19 MED ORDER — GABAPENTIN 100 MG PO CAPS: 100.0000 mg | ORAL_CAPSULE | Freq: Three times a day (TID) | ORAL | 0 refills | Status: DC

## 2021-05-19 MED ORDER — AMIODARONE HCL 200 MG PO TABS: 200.0000 mg | ORAL_TABLET | Freq: Two times a day (BID) | ORAL | 1 refills | Status: DC

## 2021-05-19 NOTE — Plan of Care (Signed)

## 2021-05-19 NOTE — Progress Notes (Signed)
Patient ID: [name], male   DOB: 02/19/1966, 55 y.o.   MRN: [id number]     Advanced Heart Failure Rounding Note  PCP-Cardiologist: Fransico Him, MD   Subjective:    Walking halls. Wants to go home. No SOB.  Remains in sinus brady with HRs in 40s   Objective:   Weight Range: (!) 167.2 kg Body mass index is 56.05 kg/m.   Vital Signs:   Temp:  [97.5 F (36.4 C)-97.6 F (36.4 C)] 97.5 F (36.4 C) (06/19 0700) Pulse Rate:  [37-72] 54 (06/19 0800) Resp:  [9-34] 14 (06/19 0800) BP: (95-129)/(60-91) 95/65 (06/19 0700) SpO2:  [90 %-100 %] 94 % (06/19 0800) Weight:  [167.2 kg] 167.2 kg (06/19 0500) Last BM Date: 05/19/21  Weight change: Filed Weights   05/17/21 0500 05/18/21 0500 05/19/21 0500  Weight: (!) 167.6 kg (!) 167 kg (!) 167.2 kg    Intake/Output:   Intake/Output Summary (Last 24 hours) at 05/19/2021 1121 Last data filed at 05/19/2021 1058 Gross per 24 hour  Intake 1228.75 ml  Output 1650 ml  Net -421.25 ml       Physical Exam   General:  Sitting up in chair. No resp difficulty. HEENT: normal Neck: supple. Unable to see JVP. Carotids 2+ bilat; no bruits. No lymphadenopathy or thryomegaly appreciated. Cor: PMI nondisplaced. Regular brady No rubs, gallops or murmurs. Lungs: clear Abdomen: obese soft, nontender, nondistended. No hepatosplenomegaly. No bruits or masses. Good bowel sounds. Extremities: no cyanosis, clubbing, rash, edema Neuro: alert & orientedx3, cranial nerves grossly intact. moves all 4 extremities w/o difficulty. Affect pleasant   Telemetry   NSR 50-60 (personally reviewed)  Labs    CBC Recent Labs    05/18/21 0137 05/19/21 0254  WBC 9.5 8.4  HGB 15.9 15.6  HCT 45.4 44.1  MCV 84.4 83.8  PLT 213 177    Basic Metabolic Panel Recent Labs    05/18/21 0355 05/19/21 0254  NA 137 137  K 3.9 4.0  CL 102 100  CO2 26 27  GLUCOSE 123* 92  BUN 29* 25*  CREATININE 1.21 1.31*  CALCIUM 9.0 9.3  MG  --  2.0    Liver  Function Tests No results for input(s): AST, ALT, ALKPHOS, BILITOT, PROT, ALBUMIN in the last 72 hours.  No results for input(s): LIPASE, AMYLASE in the last 72 hours. Cardiac Enzymes No results for input(s): CKTOTAL, CKMB, CKMBINDEX, TROPONINI in the last 72 hours.  BNP: BNP (last 3 results) Recent Labs    05/10/21 0551  BNP 464.3*     ProBNP (last 3 results) No results for input(s): PROBNP in the last 8760 hours.   D-Dimer No results for input(s): DDIMER in the last 72 hours. Hemoglobin A1C No results for input(s): HGBA1C in the last 72 hours. Fasting Lipid Panel No results for input(s): CHOL, HDL, LDLCALC, TRIG, CHOLHDL, LDLDIRECT in the last 72 hours.  Thyroid Function Tests No results for input(s): TSH, T4TOTAL, T3FREE, THYROIDAB in the last 72 hours.  Invalid input(s): FREET3  Other results:   Imaging    No results found.   Medications:     Scheduled Medications:  amiodarone  200 mg Oral BID   ARIPiprazole  10 mg Oral Daily   atorvastatin  40 mg Oral Daily   butamben-tetracaine-benzocaine  1 spray Topical Once   chlorhexidine  15 mL Mouth Rinse BID   Chlorhexidine Gluconate Cloth  6 each Topical Daily   docusate sodium  100 mg Oral BID  feeding supplement  237 mL Oral TID WC   gabapentin  100 mg Oral TID   insulin aspart  0-20 Units Subcutaneous Q4H   losartan  25 mg Oral Daily   mouth rinse  15 mL Mouth Rinse q12n4p   polyethylene glycol  17 g Oral Daily   rivaroxaban  20 mg Oral Q supper   sertraline  100 mg Oral Daily   sodium chloride flush  10-40 mL Intracatheter Q12H   spironolactone  12.5 mg Oral Daily   thiamine  100 mg Oral Daily   torsemide  20 mg Oral Daily   vitamin B-12  1,000 mcg Oral Daily    Infusions:  sodium chloride      PRN Medications: sodium chloride, acetaminophen, clonazePAM, ondansetron (ZOFRAN) IV, oxyCODONE-acetaminophen, sodium chloride flush   Assessment/Plan   1. Atrial fibrillation: With RVR.  Patient  has history of PAF. He was admitted with AF/RVR, this may have triggered CHF exacerbation, ?tachy-mediated.  TEE-guided DCCV 6/16, now back in NSR.  - Continue amiodarone 200 mg bid.   - Switch heparin back to Xarelto  - Off digoxin and Toprol XL with bradycardia.  2.  Acute on chronic systolic CHF: Nonischemic cardiomyopathy. ?If ETOH has played a role (now drinks rarely). Echo (3/18) with EF 25-30%. He was seen by EP and decided against ICD given marked obesity.  QRS not wide enough for CRT. Echo 7/19 and in 2/21 with EF 20-25%. Echo this admission with EF < 20%, normal RV.  Admitted with AF/RVR and CHF exacerbation.  D - volume status looks good. - Continue spironolactone 12.5 daily.  - Off digoxin and Toprol XL with bradycardia.  - On low-dose losartan BP soft  - Still off Farxiga - No entresto with h/o angioedema.  - Headaches with Bidil, cannot take.  - Need to keep in NSR as above.  3. PNA: Diffuse bilateral infiltrates on CXR.  - Ceftriaxone.  4. VT arrest: 6/13, progression from AF/RVR.  Has been turned down for ICD due to size.   - EP has seen. Continue amio for AF and VT  5. OSA: Continue nightly BiPAP. 6. PE: 04/14/2018, diagnosed by V/Q scan. Has been on Xarelto.  7. Anxiety/Panic attacks/depression: Followed by psychiatry. 8. Hyperlipidemia: Continue atorvastatin.   Discussed with [facility]. [place] for home today on following HF meds  Amio 200 bid Xarelto 20 Torsemide 20 daily Farxiga 10  Losartan 12.5 at night  Spiro 12.5  Atorva 40   Stop Toprol & sildenafil   Glori Bickers MD 05/19/2021 11:21 AM

## 2021-05-19 NOTE — Plan of Care (Signed)

## 2021-05-19 NOTE — Progress Notes (Signed)
eLink Physician-Brief Progress Note Patient Name: [name] DOB: 1966/06/21 MRN: [id number]   Date of Service  05/19/2021  HPI/Events of Note  Notified of bradycardia 40s Patient seen asleep on BiPap BP stable  eICU Interventions  If persistently bradycardic and symptomatic may need to decrease amiodarone or digoxin dose     Intervention Category Intermediate Interventions: Arrhythmia - evaluation and management  Judd Lien 05/19/2021, 1:44 AM

## 2021-05-19 NOTE — Progress Notes (Addendum)
Patient D/Ced home with patient's wife. All discharge education provided and discussed with both patient and patient's wife. All questions and concerns addressed with patient and wife. Medication education and next dosage also discussed with patient and wife. Patient's IV removed and patient sent via wheelchair to main entrance where patient's wife picked up patient. Also all patient's belongings sent with patient.

## 2021-05-19 NOTE — Discharge Summary (Signed)
Physician Discharge Summary  [name] HER:[id number] DOB: Sep 22, 1966 DOA: 05/09/2021  PCP: [name], MD  Admit date: 05/09/2021 Discharge date: 05/19/2021  Admitted From: home Disposition:  home  Recommendations for Outpatient Follow-up:  Follow up with PCP in 1-2 weeks Please obtain BMP/CBC in one week Please follow up with cardiology and [place]: no  Equipment/Devices: none   Discharge Condition: stable  CODE STATUS: full  Diet recommendation: Heart Healthy    Discharge Diagnoses: Active Problems:   Morbid obesity ([place])   Essential hypertension   ATRIAL FIBRILLATION, PAROXYSMAL   Chronic combined systolic and diastolic CHF (congestive heart failure) (HCC)   OSA (obstructive sleep apnea) with BiPap and oxygen   Tobacco user   Type II diabetes mellitus with manifestations (HCC)   Nonischemic cardiomyopathy (HCC)   Hypertriglyceridemia   AF (paroxysmal atrial fibrillation) (HCC)   Severe episode of recurrent major depressive disorder, with psychotic features ([place])   Respiratory failure (HCC)   Hemoptysis   Acute on chronic heart failure ([place])    Summary of HPI and Hospital Course:  Per PCCM: "55 year old man who presented with shortness of breath. This has been refractory to bronchodilators.  In ER, received some fluids and got worse.  Placed on BIPAP but began having possible frank hemoptysis so intubated for airway protection.  CTA read as multifocal pneumonia although this was underwhelming on my read.  Currently on high vent requirements and PCCM to admit."  Significant events: 6/10 intubation, CVL, admission. Initial concern for lung injury related to vaping but patient significantly improved with aggressive diuresing which indication SOB, cough, and hemoptysis was likely secondary to severe volume overload in the setting of severe CHF with chronic anticoagulation 6/10 No acute events overnight, diuresed well with IV lasix yesterday. Vent setting  much improved 6/11 Continue to diurese well with 2L removed yesterday. Pressor and sedation requirements also decreased 6/12 diuresis 6/13 extubated 6/14 refractory afib/ agitation leading to Vfib arrest x 1 min, reintubation; cardioversion to sinus 6/15 extubated, went back into Afib almost immediately on removal of ETT 6/17 no further arrhythmias, making slow progress"    Chronic Non-ischemic cardiomyopathy -Near his dry weight, follow by heart failure clinic EF on ECHO 02/22 EF 20-25% with global hypokinesis  Refractory A-fib with Hx of chronic A-fib -Underwent electrical cardioversion with Dr. Aundra Dubin 6/17 -Chronically anticoagulated on Xarelto, currently heparin  Vfib/Vtach arrest -6/14 brief, in setting of extubation, afib, resp distress, inability to tolerate BIPAP  Hx of HTN/HLD,   -Home medications include metoprolol, Lipitor, Cozaar, spirolactone, and Demadex  Plan for Cardiovascular above problems: -Advanced CHF team and EP consulted -Treated with heparin infusion and transition back to Xarelto  -Amiodarone and digoxin p.o. -Diuretics per cardiology -No plans for ICD placement per EP notes -optimize electrolytes, K>4, Mg>2   Severe OSA -Continue his home BiPAP nightly   Acute Kidney Injury - improved  -Follow BMP, urine output with diuresis -Caution with diuretics -Close follow up labs outpatient   DM2 with hyperglycemia -Home medications include Farxiga -Covered with sliding scale Novolog -resume [place]    History of anxiety and bipolar -On Abilify, Klonopin, and Zoloft   Fevers, question of PNA -Started on ceftriaxone 6/14, Pct benign -Fever curve now normal with no other continued clinical signs of PNA -Monitored off antibiotics and stable.     Discharge Instructions   Discharge Instructions     (HEART FAILURE PATIENTS) Call MD:  Anytime you have any of the following  symptoms: 1) 3 pound weight gain in 24 hours or 5 pounds in 1 week 2)  shortness of breath, with or without a dry hacking cough 3) swelling in the hands, feet or stomach 4) if you have to sleep on extra pillows at night in order to breathe.   Complete by: As directed    Call MD for:  extreme fatigue   Complete by: As directed    Call MD for:  persistant dizziness or light-headedness   Complete by: As directed    Call MD for:  persistant nausea and vomiting   Complete by: As directed    Call MD for:  severe uncontrolled pain   Complete by: As directed    Call MD for:  temperature >100.4   Complete by: As directed    Diet - low sodium heart healthy   Complete by: As directed    Heart Failure patients record your daily weight using the same scale at the same time of day   Complete by: As directed    Increase activity slowly   Complete by: As directed    STOP any activity that causes chest pain, shortness of breath, dizziness, sweating, or exessive weakness   Complete by: As directed       Allergies as of 05/19/2021       Reactions   Ace Inhibitors Anaphylaxis, Swelling   Angioedema   Bidil [isosorb Dinitrate-hydralazine] Other (See Comments)   headache   Digoxin And Related    Unspecified "side effects"   Buspirone Other (See Comments)   dizziness        Medication List     STOP taking these medications    metoprolol succinate 100 MG 24 hr tablet Commonly known as: TOPROL-XL   sildenafil 20 MG tablet Commonly known as: REVATIO       TAKE these medications    albuterol 108 (90 Base) MCG/ACT inhaler Commonly known as: VENTOLIN HFA Inhale 1-2 puffs into the lungs every 4 (four) hours as needed for shortness of breath or wheezing.   amiodarone 200 MG tablet Commonly known as: PACERONE Take 1 tablet (200 mg total) by mouth 2 (two) times daily.   ARIPiprazole 10 MG tablet Commonly known as: ABILIFY Take 1 tablet (10 mg total) by mouth daily.   atorvastatin 40 MG tablet Commonly known as: LIPITOR Take 1 tablet (40 mg total) by  mouth daily.   blood glucose meter kit and supplies Kit Use to check blood sugar. DX E11.9   clonazePAM 1 MG tablet Commonly known as: KLONOPIN Take 1 tablet (1 mg total) by mouth 2 (two) times daily as needed for anxiety.   Farxiga 10 MG Tabs tablet Generic drug: dapagliflozin propanediol TAKE 1 TABLET BY MOUTH EVERY DAY What changed: how much to take   gabapentin 100 MG capsule Commonly known as: NEURONTIN Take 1 capsule (100 mg total) by mouth 3 (three) times daily.   losartan 25 MG tablet Commonly known as: COZAAR Take 0.5 tablets (12.5 mg total) by mouth at bedtime.   pantoprazole 40 MG tablet Commonly known as: PROTONIX TAKE 1 TABLET BY MOUTH EVERY DAY   potassium chloride SA 20 MEQ tablet Commonly known as: KLOR-CON Take 40 mEq by mouth daily as needed (cramps).   PRESCRIPTION MEDICATION Inhale into the lungs See admin instructions. Bipap, pressure 16/12 with 2L of O2 - use whenever sleeping   sertraline 100 MG tablet Commonly known as: ZOLOFT Take 1 tablet (100 mg total) by mouth daily.  spironolactone 25 MG tablet Commonly known as: ALDACTONE Take 0.5 tablets (12.5 mg total) by mouth daily. Start taking on: May 20, 2021 What changed:  how much to take additional instructions   thiamine 100 MG tablet Take 1 tablet (100 mg total) by mouth daily.   torsemide 20 MG tablet Commonly known as: DEMADEX Take 1 tablet (20 mg total) by mouth daily. Start taking on: May 20, 2021 What changed: how much to take   vitamin B-12 1000 MCG tablet Commonly known as: CYANOCOBALAMIN Take 1,000 mcg by mouth daily.   Xarelto 20 MG Tabs tablet Generic drug: rivaroxaban TAKE 1 TABLET BY MOUTH EVERY DAY What changed: how much to take        Allergies  Allergen Reactions   Ace Inhibitors Anaphylaxis and Swelling    Angioedema   Bidil [Isosorb Dinitrate-Hydralazine] Other (See Comments)    headache   Digoxin And Related     Unspecified "side effects"    Buspirone Other (See Comments)    dizziness     If you experience worsening of your admission symptoms, develop shortness of breath, life threatening emergency, suicidal or homicidal thoughts you must seek medical attention immediately by calling 911 or calling your MD immediately  if symptoms less severe.    Please note   You were cared for by a hospitalist during your hospital stay. If you have any questions about your discharge medications or the care you received while you were in the hospital after you are discharged, you can call the unit and asked to speak with the hospitalist on call if the hospitalist that took care of you is not available. Once you are discharged, your primary care physician will handle any further medical issues. Please note that NO REFILLS for any discharge medications will be authorized once you are discharged, as it is imperative that you return to your primary care physician (or establish a relationship with a primary care physician if you do not have one) for your aftercare needs so that they can reassess your need for medications and monitor your lab values.   Consultations: Heart Failure team EP   Procedures/Studies: DG Chest 1 View  Result Date: 05/16/2021 CLINICAL DATA:  Respiratory failure EXAM: CHEST  1 VIEW COMPARISON:  05/14/2021 FINDINGS: Endotracheal tube has been removed in the interval. Right jugular line is again identified and stable. Cardiac shadow remains enlarged. Lungs are well aerated bilaterally without focal infiltrate or sizable effusion. IMPRESSION: Improved aeration bilaterally.  No acute abnormality noted. Electronically Signed   By: Inez Catalina M.D.   On: 05/16/2021 08:26   CT Angio Chest Pulmonary Embolism (PE) W or WO Contrast  Result Date: 05/10/2021 CLINICAL DATA:  Chest pain.  Shortness of breath. EXAM: CT ANGIOGRAPHY CHEST WITH CONTRAST TECHNIQUE: Multidetector CT imaging of the chest was performed using the standard protocol  during bolus administration of intravenous contrast. Multiplanar CT image reconstructions and MIPs were obtained to evaluate the vascular anatomy. CONTRAST:  20m OMNIPAQUE IOHEXOL 350 MG/ML SOLN COMPARISON:  CT chest 04/12/2017, chest x-ray 05/09/2021 FINDINGS: Cardiovascular: Satisfactory opacification of the pulmonary arteries to the segmental level. No evidence of pulmonary embolism. The main pulmonary artery is slightly enlarged in enlarged left ventricle. No significant pericardial effusion. The thoracic aorta is normal in caliber. No atherosclerotic plaque of the thoracic aorta. No coronary artery calcifications. Mediastinum/Nodes: Right hilar lymphadenopathy: 1.1 cm (5:59). No definite left hilar lymphadenopathy. Several prominent/borderline enlarged mediastinal lymph nodes. No axillary lymphadenopathy. The thyroid gland is  unremarkable. The esophagus and trachea are unremarkable. Lungs/Pleura: Expiratory phase of respiration. Patchy peribronchovascular ground-glass airspace opacity within the right lower lobe. Similar finding within the right middle lobe. Similar findings to a lesser extent within the left lower lobe with limited evaluation due to respiratory motion artifact. Upper Abdomen: Calcified gallstone within the gallbladder lumen. Otherwise no acute abnormality. Musculoskeletal: No chest wall abnormality. No suspicious lytic or blastic osseous lesions. No acute displaced fracture. Multilevel degenerative changes of the spine. Review of the MIP images confirms the above findings. IMPRESSION: 1. No pulmonary embolus. 2. Multifocal pneumonia. COVID-19 infection not excluded. Followup PA and lateral chest X-ray is recommended in 3-4 weeks following therapy to ensure resolution. 3. Likely reactive right hilar and mediastinal lymph node. 4. Cardiomegaly. 5. Cholelithiasis. Electronically Signed   By: Iven Finn M.D.   On: 05/10/2021 03:00   DG CHEST PORT 1 VIEW  Result Date: 05/14/2021 CLINICAL  DATA:  Endotracheal tube EXAM: PORTABLE CHEST 1 VIEW COMPARISON:  Radiograph 05/14/2021 FINDINGS: *Endotracheal tube tip 5.4 cm from the carina. *Right IJ catheter tip at the superior cavoatrial junction. Telemetry leads and pacer pads overlie the chest. Stable cardiomegaly. Diffuse patchy opacities throughout both lungs, most coalescent in the retrocardiac space. No pneumothorax. No acute osseous or soft tissue abnormality. IMPRESSION: Satisfactory positioning of the lines and tubes, as above. Stable cardiomegaly. Diffuse bilateral patchy opacities, could reflect edema, atelectasis and/or airspace disease. Electronically Signed   By: Lovena Le M.D.   On: 05/14/2021 04:11   DG CHEST PORT 1 VIEW  Result Date: 05/14/2021 CLINICAL DATA:  Intubation EXAM: PORTABLE CHEST 1 VIEW COMPARISON:  05/13/2021 FINDINGS: Support Apparatus: --Endotracheal tube: Tip at the level of the clavicular heads. --Enteric tube:No longer present --Vascular catheter(s):Right internal jugular vein approach central venous catheter tip is at the cavoatrial junction. --Other: None Moderate cardiomegaly with unchanged left retrocardiac opacities. No pneumothorax. IMPRESSION: 1. Endotracheal tube tip at the level of the clavicular heads. Enteric tube no longer present. 2. Right IJ approach central venous catheter with tip at the cavoatrial junction. No pneumothorax. 3. Unchanged left retrocardiac opacities. Electronically Signed   By: Ulyses Jarred M.D.   On: 05/14/2021 03:00   DG CHEST PORT 1 VIEW  Result Date: 05/13/2021 CLINICAL DATA:  Showed is of breath on ventilation EXAM: PORTABLE CHEST 1 VIEW COMPARISON:  Radiograph 05/11/2021 FINDINGS: Endotracheal tube tip terminates in the mid to upper trachea, 6.8 cm from the carina. Transesophageal tube tip and side port distal to the GE junction. Telemetry leads overlie the chest. Coarse heterogeneous mixed interstitial and airspace opacities are present in both lungs with a mid to lower  lung predominance, more focally coalescent consolidation and air bronchograms in the retrocardiac space. Overall extent of disease has significantly improved from most recent comparison prior with particularly improving aeration of the right lung. Cannot exclude small left effusion. No visible layering right effusion. No pneumothorax. Stable cardiomegaly. Remaining cardiomediastinal contours are unremarkable for portable technique. No acute osseous or soft tissue abnormality. IMPRESSION: Improving heterogeneous bilateral airspace opacities, particularly in the right mid to lower lung. Possible left effusion. Lines and tubes as above. Electronically Signed   By: Lovena Le M.D.   On: 05/13/2021 06:40   DG Chest Port 1 View  Result Date: 05/11/2021 CLINICAL DATA:  Follow-up of ARDS. EXAM: PORTABLE CHEST 1 VIEW COMPARISON:  One day prior FINDINGS: Endotracheal tube terminates 5.4 cm above carina. Right internal jugular line tip at high right atrium. Nasogastric tube extends beyond  the inferior aspect of the film. Moderate cardiomegaly. No right-sided pleural effusion. The inferior left chest is suboptimally evaluated. Cannot exclude small pleural fluid. No pneumothorax. Right greater than left interstitial and airspace disease is similar, given differences in technique. Mild right hemidiaphragm elevation. IMPRESSION: No significant change since one day prior. Multifocal interstitial and airspace disease, most consistent with pneumonia and ARDS. Possible small left pleural effusion. Electronically Signed   By: Abigail Miyamoto M.D.   On: 05/11/2021 07:55   DG Chest Portable 1 View  Result Date: 05/10/2021 CLINICAL DATA:  Shortness of breath. EXAM: PORTABLE CHEST 1 VIEW COMPARISON:  May 09, 2021. FINDINGS: The heart size and mediastinal contours are within normal limits. Endotracheal and nasogastric tubes are in grossly good position. Right internal jugular catheter is noted with tip in expected position of the  SVC. No pneumothorax or pleural effusion is noted. Increased bilateral lung opacities are noted, right worse than left, most consistent with multifocal pneumonia. The visualized skeletal structures are unremarkable. IMPRESSION: Endotracheal and nasogastric tubes are in grossly good position. Right internal jugular catheter is noted with tip in expected position of the SVC. Significantly increased bilateral lung opacities are noted, right worse than left, consistent with multifocal pneumonia. Electronically Signed   By: Marijo Conception M.D.   On: 05/10/2021 08:37   DG Chest Portable 1 View  Result Date: 05/09/2021 CLINICAL DATA:  Chest pain and shortness of breath. EXAM: PORTABLE CHEST 1 VIEW COMPARISON:  November 20, 2018 FINDINGS: Mildly increased interstitial lung markings are seen with mild prominence of the pulmonary vasculature. Mild areas of atelectasis and/or infiltrate are seen within the bilateral lung bases. There is no evidence of a pleural effusion. The cardiac silhouette is moderately enlarged and unchanged in size. The visualized skeletal structures are unremarkable. IMPRESSION: Stable cardiomegaly with mild pulmonary vascular congestion. Electronically Signed   By: Virgina Norfolk M.D.   On: 05/09/2021 22:18   DG Abd Portable 1V  Result Date: 05/14/2021 CLINICAL DATA:  Check gastric tube placement EXAM: PORTABLE ABDOMEN - 1 VIEW COMPARISON:  None. FINDINGS: Gastric catheter is noted with the tip in the stomach. Proximal side port lies in the distal esophagus. This should be advanced several cm deeper into the stomach. IMPRESSION: Gastric catheter as described. This should be advanced deeper into the stomach. Electronically Signed   By: Inez Catalina M.D.   On: 05/14/2021 09:29   ECHOCARDIOGRAM COMPLETE  Result Date: 05/14/2021    ECHOCARDIOGRAM REPORT   Patient Name:   MURIEL WILBER Date of Exam: 05/14/2021 Medical Rec #:  [id number]      Height:       68.0 in Accession #:    [id number]      Weight:       347.7 lb Date of Birth:  [birth year]     BSA:          2.585 m Patient Age:    9 years       BP:           108/76 mmHg Patient Gender: M              HR:           67 bpm. Exam Location:  Inpatient Procedure: 2D Echo, Color Doppler, Cardiac Doppler and Intracardiac            Opacification Agent Indications:    Cardiac arrest  History:        Patient has prior history of Echocardiogram  examinations, most                 recent 01/23/2020. CHF, Arrythmias:Atrial Fibrillation; Risk                 Factors:Diabetes.  Sonographer:    Maudry Mayhew MHA, RDMS, RVT, RDCS Referring Phys: [id number] Omar Person  Sonographer Comments: Patient is morbidly obese and echo performed with patient supine and on artificial respirator. IMPRESSIONS  1. Left ventricular ejection fraction, by estimation, is <20%. The left ventricle has severely decreased function. The left ventricle demonstrates global hypokinesis. The left ventricular internal cavity size was severely dilated. Left ventricular diastolic parameters are indeterminate.  2. Right ventricular systolic function is normal. The right ventricular size is normal.  3. Left atrial size was mildly dilated.  4. The mitral valve is normal in structure. Trivial mitral valve regurgitation. No evidence of mitral stenosis.  5. The aortic valve has an indeterminant number of cusps. Aortic valve regurgitation is not visualized. No aortic stenosis is present.  6. Aortic dilatation noted. There is mild dilatation of the ascending aorta, measuring 39 mm.  7. The inferior vena cava is normal in size with greater than 50% respiratory variability, suggesting right atrial pressure of 3 mmHg. FINDINGS  Left Ventricle: Left ventricular ejection fraction, by estimation, is <20%. The left ventricle has severely decreased function. The left ventricle demonstrates global hypokinesis. Definity contrast agent was given IV to delineate the left ventricular endocardial borders. The  left ventricular internal cavity size was severely dilated. There is no left ventricular hypertrophy. Left ventricular diastolic parameters are indeterminate. Right Ventricle: The right ventricular size is normal. Right ventricular systolic function is normal. Left Atrium: Left atrial size was mildly dilated. Right Atrium: Right atrial size was normal in size. Pericardium: There is no evidence of pericardial effusion. Mitral Valve: The mitral valve is normal in structure. Trivial mitral valve regurgitation. No evidence of mitral valve stenosis. Tricuspid Valve: The tricuspid valve is normal in structure. Tricuspid valve regurgitation is not demonstrated. No evidence of tricuspid stenosis. Aortic Valve: The aortic valve has an indeterminant number of cusps. Aortic valve regurgitation is not visualized. No aortic stenosis is present. Aortic valve mean gradient measures 4.0 mmHg. Aortic valve peak gradient measures 7.7 mmHg. Pulmonic Valve: The pulmonic valve was normal in structure. Pulmonic valve regurgitation is not visualized. No evidence of pulmonic stenosis. Aorta: Aortic dilatation noted. There is mild dilatation of the ascending aorta, measuring 39 mm. Venous: The inferior vena cava is normal in size with greater than 50% respiratory variability, suggesting right atrial pressure of 3 mmHg.  LEFT VENTRICLE PLAX 2D LVIDd:         8.50 cm  Diastology LVIDs:         7.50 cm  LV e' lateral:   7.78 cm/s LV PW:         0.60 cm  LV E/e' lateral: 14.0 LV IVS:        0.40 cm LVOT diam:     2.40 cm LV SV:         29 LV SV Index:   11 LVOT Area:     4.52 cm  LEFT ATRIUM              Index LA diam:        4.70 cm  1.82 cm/m LA Vol (A2C):   180.0 ml 69.63 ml/m LA Vol (A4C):   93.8 ml  36.28 ml/m LA Biplane Vol: 130.0 ml 50.29  ml/m  AORTIC VALVE AV Area (Vmax):    1.22 cm AV Area (Vmean):   1.39 cm AV Area (VTI):     1.25 cm AV Vmax:           139.00 cm/s AV Vmean:          92.700 cm/s AV VTI:            0.231 m AV  Peak Grad:      7.7 mmHg AV Mean Grad:      4.0 mmHg LVOT Vmax:         37.50 cm/s LVOT Vmean:        28.400 cm/s LVOT VTI:          0.064 m LVOT/AV VTI ratio: 0.28  AORTA Ao Root diam: 3.10 cm MITRAL VALVE MV Area (PHT): 6.32 cm     SHUNTS MV Decel Time: 120 msec     Systemic VTI:  0.06 m MR Peak grad: 16.0 mmHg     Systemic Diam: 2.40 cm MR Vmax:      200.00 cm/s MV E velocity: 109.00 cm/s MV A velocity: 27.20 cm/s MV E/A ratio:  4.01 Kirk Ruths MD Electronically signed by Kirk Ruths MD Signature Date/Time: 05/14/2021/1:36:58 PM    Final    DG HIP UNILAT WITH PELVIS 2-3 VIEWS LEFT  Result Date: 05/13/2021 CLINICAL DATA:  Chronic left hip pain EXAM: DG HIP (WITH OR WITHOUT PELVIS) 2-3V LEFT COMPARISON:  None. FINDINGS: SI joints are non widened. Pubic symphysis and rami appear intact. No fracture or malalignment. IMPRESSION: Negative. Electronically Signed   By: Donavan Foil M.D.   On: 05/13/2021 18:53   VAS [place] LOWER EXTREMITY VENOUS (DVT)  Result Date: 05/15/2021  Lower Venous DVT Study Patient Name:  DANYON MCGINNESS  Date of Exam:   05/15/2021 Medical Rec #: [id number]       Accession #:    [id number] Date of Birth: May 13, 1966      Patient Gender: M Patient Age:   54Y Exam Location:  [facility] Procedure:      VAS [place] LOWER EXTREMITY VENOUS (DVT) Referring Phys: [id number] Candee Furbish --------------------------------------------------------------------------------  Indications: Edema, and Swelling.  Comparison Study: prior 11/22/18 Performing Technologist: Archie Patten RVS  Examination Guidelines: A complete evaluation includes B-mode imaging, spectral Doppler, color Doppler, and power Doppler as needed of all accessible portions of each vessel. Bilateral testing is considered an integral part of a complete examination. Limited examinations for reoccurring indications may be performed as noted. The reflux portion of the exam is performed with the patient in reverse Trendelenburg.   +-----+---------------+---------+-----------+----------+--------------+ RIGHTCompressibilityPhasicitySpontaneityPropertiesThrombus Aging +-----+---------------+---------+-----------+----------+--------------+ CFV  Full           Yes      Yes                                 +-----+---------------+---------+-----------+----------+--------------+   +---------+---------------+---------+-----------+----------+--------------+ LEFT     CompressibilityPhasicitySpontaneityPropertiesThrombus Aging +---------+---------------+---------+-----------+----------+--------------+ CFV      Full           Yes      Yes                                 +---------+---------------+---------+-----------+----------+--------------+ SFJ      Full                                                        +---------+---------------+---------+-----------+----------+--------------+  FV Prox  Full                                                        +---------+---------------+---------+-----------+----------+--------------+ FV Mid   Full                                                        +---------+---------------+---------+-----------+----------+--------------+ FV DistalFull                                                        +---------+---------------+---------+-----------+----------+--------------+ PFV      Full                                                        +---------+---------------+---------+-----------+----------+--------------+ POP      Full           Yes      Yes                                 +---------+---------------+---------+-----------+----------+--------------+ PTV      Full                                                        +---------+---------------+---------+-----------+----------+--------------+ PERO     Full                                                         +---------+---------------+---------+-----------+----------+--------------+     Summary: RIGHT: - No evidence of common femoral vein obstruction.  LEFT: - There is no evidence of deep vein thrombosis in the lower extremity.  - No cystic structure found in the popliteal fossa.  *See table(s) above for measurements and observations. Electronically signed by Harold Barban MD on 05/15/2021 at 7:51:11 PM.    Final    ECHOCARDIOGRAM LIMITED  Result Date: 05/10/2021    ECHOCARDIOGRAM LIMITED REPORT   Patient Name:   HOOVER GREWE Date of Exam: 05/10/2021 Medical Rec #:  [id number]      Height:       68.0 in Accession #:    [id number]     Weight:       380.0 lb Date of Birth:  02/10/1966     BSA:          2.685 m Patient Age:    48 years       BP:           80/66 mmHg  Patient Gender: M              HR:           103 bpm. Exam Location:  Inpatient Procedure: Limited Echo, Color Doppler and Cardiac Doppler Indications:    I42.9 Cardiomyopathy (unspecified)  History:        Patient has prior history of Echocardiogram examinations, most                 recent 01/23/2020. CHF, Arrythmias:Atrial Fibrillation; Risk                 Factors:Sleep Apnea, Hypertension, Dyslipidemia and ETOH Abuse.  Sonographer:    Marc Morgans Referring Phys: [id number] [place]  1. Left ventricular ejection fraction, by estimation, is <20%. The left ventricle has severely decreased function. The left ventricle demonstrates global hypokinesis. The left ventricular internal cavity size was severely dilated. Left ventricular diastolic function could not be evaluated.  2. Right ventricular systolic function is moderately reduced. The right ventricular size is normal.  3. Left atrial size was moderately dilated.  4. The mitral valve is normal in structure. Trivial mitral valve regurgitation. No evidence of mitral stenosis.  5. The aortic valve is normal in structure. Aortic valve regurgitation is trivial. No aortic stenosis is present.   6. Aortic dilatation noted. There is mild dilatation of the ascending aorta, measuring 40 mm.  7. The inferior vena cava is normal in size with greater than 50% respiratory variability, suggesting right atrial pressure of 3 mmHg. FINDINGS  Left Ventricle: Left ventricular ejection fraction, by estimation, is <20%. The left ventricle has severely decreased function. The left ventricle demonstrates global hypokinesis. The left ventricular internal cavity size was severely dilated. There is no left ventricular hypertrophy. Left ventricular diastolic function could not be evaluated. Right Ventricle: The right ventricular size is normal. Right ventricular systolic function is moderately reduced. Left Atrium: Left atrial size was moderately dilated. Right Atrium: Right atrial size was normal in size. Pericardium: There is no evidence of pericardial effusion. Mitral Valve: The mitral valve is normal in structure. Trivial mitral valve regurgitation. No evidence of mitral valve stenosis. Tricuspid Valve: The tricuspid valve is normal in structure. Tricuspid valve regurgitation is not demonstrated. No evidence of tricuspid stenosis. Aortic Valve: The aortic valve is normal in structure. Aortic valve regurgitation is trivial. No aortic stenosis is present. Aortic valve mean gradient measures 2.0 mmHg. Aortic valve peak gradient measures 2.7 mmHg. Aortic valve area, by VTI measures 4.98 cm. Pulmonic Valve: The pulmonic valve was normal in structure. Pulmonic valve regurgitation is not visualized. No evidence of pulmonic stenosis. Aorta: Aortic dilatation noted. There is mild dilatation of the ascending aorta, measuring 40 mm. Venous: The inferior vena cava is normal in size with greater than 50% respiratory variability, suggesting right atrial pressure of 3 mmHg. IAS/Shunts: No atrial level shunt detected by color flow Doppler. LEFT VENTRICLE PLAX 2D LVIDd:         7.70 cm LVIDs:         7.00 cm LV PW:         1.20 cm LV IVS:         1.10 cm LVOT diam:     2.60 cm LV SV:         64 LV SV Index:   24 LVOT Area:     5.31 cm  RIGHT VENTRICLE RV S prime:     7.65 cm/s LEFT ATRIUM  Index LA diam:      5.10 cm  1.90 cm/m LA Vol (A4C): 120.0 ml 44.70 ml/m  AORTIC VALVE AV Area (Vmax):    5.00 cm AV Area (Vmean):   4.01 cm AV Area (VTI):     4.98 cm AV Vmax:           82.80 cm/s AV Vmean:          63.500 cm/s AV VTI:            0.129 m AV Peak Grad:      2.7 mmHg AV Mean Grad:      2.0 mmHg LVOT Vmax:         78.00 cm/s LVOT Vmean:        48.000 cm/s LVOT VTI:          0.121 m LVOT/AV VTI ratio: 0.94  AORTA Ao Root diam: 3.60 cm Ao Asc diam:  4.00 cm MITRAL VALVE MV Area (PHT): 4.04 cm    SHUNTS MV Decel Time: 188 msec    Systemic VTI:  0.12 m MV E velocity: 86.50 cm/s  Systemic Diam: 2.60 cm MV A velocity: 22.70 cm/s MV E/A ratio:  3.81 Fransico Him MD Electronically signed by Fransico Him MD Signature Date/Time: 05/10/2021/9:41:46 AM    Final        Subjective: Pt seen with wife at bedside in ICU. Today is their anniversary.  Pt reports ambulating today and feels well.  No chest pain, SOB or other complaints.  Eager to go celebrate.  Discharge Exam: Vitals:   05/19/21 0700 05/19/21 0800  BP: 95/65   Pulse: (!) 50 (!) 54  Resp: 14 14  Temp: (!) 97.5 F (36.4 C)   SpO2: 95% 94%   Vitals:   05/19/21 0416 05/19/21 0500 05/19/21 0700 05/19/21 0800  BP:   95/65   Pulse: (!) 43  (!) 50 (!) 54  Resp: 20  14 14   Temp:   (!) 97.5 F (36.4 C)   TempSrc:   Axillary   SpO2: 92%  95% 94%  Weight:  (!) 167.2 kg    Height:        General: Pt is alert, awake, not in acute distress, morbidly obese Cardiovascular: RRR, S1/S2 +, no rubs, no gallops Respiratory: CTA bilaterally, no wheezing, no rhonchi Abdominal: Soft, NT, ND, bowel sounds + Extremities: ble edema, no cyanosis    The results of significant diagnostics from this hospitalization (including imaging, microbiology, ancillary and laboratory) are listed  below for reference.     Microbiology: Recent Results (from the past 240 hour(s))  Resp Panel by RT-PCR (Flu A&B, Covid) Nasopharyngeal Swab     Status: None   Collection Time: 05/09/21 10:21 PM   Specimen: Nasopharyngeal Swab; Nasopharyngeal(NP) swabs in vial transport medium  Result Value Ref Range Status   SARS Coronavirus 2 by RT PCR NEGATIVE NEGATIVE Final    Comment: (NOTE) SARS-CoV-2 target nucleic acids are NOT DETECTED.  The SARS-CoV-2 RNA is generally detectable in upper respiratory specimens during the acute phase of infection. The lowest concentration of SARS-CoV-2 viral copies this assay can detect is 138 copies/mL. A negative result does not preclude SARS-Cov-2 infection and should not be used as the sole basis for treatment or other patient management decisions. A negative result may occur with  improper specimen collection/handling, submission of specimen other than nasopharyngeal swab, presence of viral mutation(s) within the areas targeted by this assay, and inadequate number of viral copies(<138 copies/mL). A  negative result must be combined with clinical observations, patient history, and epidemiological information. The expected result is Negative.  Fact Sheet for Patients:  EntrepreneurPulse.com.au  Fact Sheet for Healthcare Providers:  IncredibleEmployment.be  This test is no t yet approved or cleared by the [place] FDA and  has been authorized for detection and/or diagnosis of SARS-CoV-2 by FDA under an Emergency Use Authorization (EUA). This EUA will remain  in effect (meaning this test can be used) for the duration of the COVID-19 declaration under Section 564(b)(1) of the Act, 21 U.S.C.section 360bbb-3(b)(1), unless the authorization is terminated  or revoked sooner.       Influenza A by PCR NEGATIVE NEGATIVE Final   Influenza B by PCR NEGATIVE NEGATIVE Final    Comment: (NOTE) The Xpert Xpress  SARS-CoV-2/FLU/RSV plus assay is intended as an aid in the diagnosis of influenza from Nasopharyngeal swab specimens and should not be used as a sole basis for treatment. Nasal washings and aspirates are unacceptable for Xpert Xpress SARS-CoV-2/FLU/RSV testing.  Fact Sheet for Patients: EntrepreneurPulse.com.au  Fact Sheet for Healthcare Providers: IncredibleEmployment.be  This test is not yet approved or cleared by the [place] FDA and has been authorized for detection and/or diagnosis of SARS-CoV-2 by FDA under an Emergency Use Authorization (EUA). This EUA will remain in effect (meaning this test can be used) for the duration of the COVID-19 declaration under Section 564(b)(1) of the Act, 21 U.S.C. section 360bbb-3(b)(1), unless the authorization is terminated or revoked.  Performed at [facility], [place] [address], [place], [place] [address]   MRSA PCR Screening     Status: None   Collection Time: 05/10/21  6:23 AM   Specimen: Nasal Mucosa; Nasopharyngeal  Result Value Ref Range Status   MRSA by PCR NEGATIVE NEGATIVE Final    Comment:        The GeneXpert MRSA Assay (FDA approved for NASAL specimens only), is one component of a comprehensive MRSA colonization surveillance program. It is not intended to diagnose MRSA infection nor to guide or monitor treatment for MRSA infections. Performed at [facility], [place] [address], [place],  [address]   Culture, Respiratory w Gram Stain     Status: None   Collection Time: 05/12/21 11:24 AM   Specimen: Tracheal Aspirate; Respiratory  Result Value Ref Range Status   Specimen Description TRACHEAL ASPIRATE  Final   Special Requests NONE  Final   Gram Stain   Final    FEW WBC PRESENT, PREDOMINANTLY PMN FEW GRAM NEGATIVE RODS RARE GRAM POSITIVE RODS RARE GRAM POSITIVE COCCI IN CLUSTERS    Culture   Final    MODERATE Normal respiratory flora-no Staph aureus or  Pseudomonas seen Performed at [facility], [address] [place],  [address]    Report Status 05/14/2021 FINAL  Final     Labs: BNP (last 3 results) Recent Labs    05/10/21 0551  BNP 701.7*   Basic Metabolic Panel: Recent Labs  Lab 05/14/21 0227 05/14/21 0301 05/14/21 0333 05/14/21 1648 05/14/21 1704 05/15/21 0436 05/16/21 0322 05/17/21 0103 05/17/21 0328 05/18/21 0355 05/19/21 0254  NA 146* 145   < > 144   < > 147* 143 142 145 137 137  K 3.8 4.1   < > 3.6   < > 3.8 3.7 3.8 3.7 3.9 4.0  CL 104 104  --  102  --  106 107 106  --  102 100  CO2 29 30  --  29  --  28 27 25   --  26 27  GLUCOSE 169* 196*  --  192*  --  143* 159* 129*  --  123* 92  BUN 53* 55*  --  54*  --  47* 39* 36*  --  29* 25*  CREATININE 1.86* 1.80*  --  1.81*  --  1.55* 1.40* 1.42*  --  1.21 1.31*  CALCIUM 9.4 8.7*  --  8.9  --  8.7* 8.9 9.0  --  9.0 9.3  MG 2.8* 2.5*  --  3.1*  --  3.1*  --   --   --   --  2.0  PHOS  --  3.8  --  3.3  --  3.1  --   --   --   --   --    < > = values in this interval not displayed.   Liver Function Tests: Recent Labs  Lab 05/14/21 0301 05/14/21 0600  AST 83* 65*  ALT 65* 63*  ALKPHOS 136* 138*  BILITOT 3.0* 2.2*  PROT 8.2* 8.3*  ALBUMIN 3.2* 3.2*   No results for input(s): LIPASE, AMYLASE in the last 168 hours. No results for input(s): AMMONIA in the last 168 hours. CBC: Recent Labs  Lab 05/14/21 0301 05/14/21 0333 05/15/21 0436 05/15/21 2342 05/17/21 0103 05/17/21 0328 05/18/21 0137 05/19/21 0254  WBC 13.8*   < > 12.4* 12.1* 8.5  --  9.5 8.4  NEUTROABS 8.2*  --   --   --   --   --   --   --   HGB 16.6   < > 16.4 16.6 16.4 15.3 15.9 15.6  HCT 47.7   < > 47.9 48.1 47.0 45.0 45.4 44.1  MCV 85.6   < > 86.3 84.8 84.5  --  84.4 83.8  PLT 280   < > 231 264 241  --  213 206   < > = values in this interval not displayed.   Cardiac Enzymes: No results for input(s): CKTOTAL, CKMB, CKMBINDEX, TROPONINI in the last 168 hours. BNP: Invalid  input(s): POCBNP CBG: Recent Labs  Lab 05/18/21 1918 05/18/21 2313 05/19/21 0323 05/19/21 0740 05/19/21 1145  GLUCAP 166* 76 91 93 97   D-Dimer No results for input(s): DDIMER in the last 72 hours. Hgb A1c No results for input(s): HGBA1C in the last 72 hours. Lipid Profile No results for input(s): CHOL, HDL, LDLCALC, TRIG, CHOLHDL, LDLDIRECT in the last 72 hours. Thyroid function studies No results for input(s): TSH, T4TOTAL, T3FREE, THYROIDAB in the last 72 hours.  Invalid input(s): FREET3 Anemia work up No results for input(s): VITAMINB12, FOLATE, FERRITIN, TIBC, IRON, RETICCTPCT in the last 72 hours. Urinalysis    Component Value Date/Time   COLORURINE YELLOW 11/19/2020 1454   APPEARANCEUR CLEAR 11/19/2020 1454   LABSPEC 1.020 11/19/2020 1454   PHURINE 6.0 11/19/2020 1454   GLUCOSEU >=1000 (A) 11/19/2020 1454   HGBUR NEGATIVE 11/19/2020 1454   BILIRUBINUR NEGATIVE 11/19/2020 1454   KETONESUR TRACE (A) 11/19/2020 1454   PROTEINUR NEGATIVE 04/22/2018 0345   UROBILINOGEN >=8.0 (A) 11/19/2020 1454   NITRITE NEGATIVE 11/19/2020 1454   LEUKOCYTESUR NEGATIVE 11/19/2020 1454   Sepsis Labs Invalid input(s): PROCALCITONIN,  WBC,  LACTICIDVEN Microbiology Recent Results (from the past 240 hour(s))  Resp Panel by RT-PCR (Flu A&B, Covid) Nasopharyngeal Swab     Status: None   Collection Time: 05/09/21 10:21 PM   Specimen: Nasopharyngeal Swab; Nasopharyngeal(NP) swabs in vial transport medium  Result Value Ref Range Status   SARS Coronavirus 2 by RT PCR NEGATIVE NEGATIVE Final    Comment: (NOTE) SARS-CoV-2 target nucleic acids are NOT DETECTED.  The SARS-CoV-2 RNA is generally detectable in upper respiratory specimens during the acute phase of infection. The lowest concentration of SARS-CoV-2 viral copies this assay can detect is 138 copies/mL. A negative result does not preclude SARS-Cov-2 infection and should not be used as the sole basis for treatment or other patient  management decisions. A negative result may occur with  improper specimen collection/handling, submission of specimen other than nasopharyngeal swab, presence of viral mutation(s) within the areas targeted by this assay, and inadequate number of viral copies(<138 copies/mL). A negative result must be combined with clinical observations, patient history, and epidemiological information. The expected result is Negative.  Fact Sheet for Patients:  EntrepreneurPulse.com.au  Fact Sheet for Healthcare Providers:  IncredibleEmployment.be  This test is no t yet approved or cleared by the [place] FDA and  has been authorized for detection and/or diagnosis of SARS-CoV-2 by FDA under an Emergency Use Authorization (EUA). This EUA will remain  in effect (meaning this test can be used) for the duration of the COVID-19 declaration under Section 564(b)(1) of the Act, 21 U.S.C.section 360bbb-3(b)(1), unless the authorization is terminated  or revoked sooner.       Influenza A by PCR NEGATIVE NEGATIVE Final   Influenza B by PCR NEGATIVE NEGATIVE Final    Comment: (NOTE) The Xpert Xpress SARS-CoV-2/FLU/RSV plus assay is intended as an aid in the diagnosis of influenza from Nasopharyngeal swab specimens and should not be used as a sole basis for treatment. Nasal washings and aspirates are unacceptable for Xpert Xpress SARS-CoV-2/FLU/RSV testing.  Fact Sheet for Patients: EntrepreneurPulse.com.au  Fact Sheet for Healthcare Providers: IncredibleEmployment.be  This test is not yet approved or cleared by the [place] FDA and has been authorized for detection and/or diagnosis of SARS-CoV-2 by FDA under an Emergency Use Authorization (EUA). This EUA will remain in effect (meaning this test can be used) for the duration of the COVID-19 declaration under Section 564(b)(1) of the Act, 21 U.S.C. section 360bbb-3(b)(1),  unless the authorization is terminated or revoked.  Performed at [facility], [place] [address], [place], [place] [address]   MRSA PCR Screening     Status: None   Collection Time: 05/10/21  6:23 AM   Specimen: Nasal Mucosa; Nasopharyngeal  Result Value Ref Range Status   MRSA by PCR NEGATIVE NEGATIVE Final    Comment:        The GeneXpert MRSA Assay (FDA approved for NASAL specimens only), is one component of a comprehensive MRSA colonization surveillance program. It is not intended to diagnose MRSA infection nor to guide or monitor treatment for MRSA infections. Performed at [facility], [place] [address] [place], [place] [address]   Culture, Respiratory w Gram Stain     Status: None   Collection Time: 05/12/21 11:24 AM   Specimen: Tracheal Aspirate; Respiratory  Result Value Ref Range Status   Specimen Description TRACHEAL ASPIRATE  Final   Special Requests NONE  Final   Gram Stain   Final    FEW WBC PRESENT, PREDOMINANTLY PMN FEW GRAM NEGATIVE RODS RARE GRAM POSITIVE RODS RARE GRAM POSITIVE COCCI IN CLUSTERS    Culture   Final    MODERATE Normal respiratory flora-no Staph aureus or Pseudomonas seen Performed at [facility], [address] [place],  [address]  Report Status 05/14/2021 FINAL  Final     Time coordinating discharge: Less than 30 minutes  SIGNED:   Ezekiel Slocumb, DO [facility] 05/19/2021, 11:58 AM   If 7PM-7AM, please contact night-coverage [email or web address]

## 2021-05-20 ENCOUNTER — Other Ambulatory Visit: Payer: Self-pay

## 2021-05-20 ENCOUNTER — Telehealth: Payer: Self-pay

## 2021-05-20 ENCOUNTER — Encounter (HOSPITAL_COMMUNITY): Payer: Self-pay | Admitting: Psychiatry

## 2021-05-20 ENCOUNTER — Telehealth (INDEPENDENT_AMBULATORY_CARE_PROVIDER_SITE_OTHER): Payer: Medicare HMO | Admitting: Psychiatry

## 2021-05-20 DIAGNOSIS — Z630 Problems in relationship with spouse or partner: Secondary | ICD-10-CM | POA: Diagnosis not present

## 2021-05-20 DIAGNOSIS — F410 Panic disorder [episodic paroxysmal anxiety] without agoraphobia: Secondary | ICD-10-CM

## 2021-05-20 DIAGNOSIS — F3175 Bipolar disorder, in partial remission, most recent episode depressed: Secondary | ICD-10-CM

## 2021-05-20 DIAGNOSIS — R69 Illness, unspecified: Secondary | ICD-10-CM | POA: Diagnosis not present

## 2021-05-20 MED ORDER — CLONAZEPAM 1 MG PO TABS: 1.0000 mg | ORAL_TABLET | Freq: Two times a day (BID) | ORAL | 2 refills | Status: DC | PRN

## 2021-05-20 MED ORDER — SERTRALINE HCL 100 MG PO TABS: 100.0000 mg | ORAL_TABLET | Freq: Every day | ORAL | 0 refills | Status: DC

## 2021-05-20 MED ORDER — ARIPIPRAZOLE 10 MG PO TABS: 10.0000 mg | ORAL_TABLET | Freq: Every day | ORAL | 0 refills | Status: DC

## 2021-05-20 NOTE — Progress Notes (Signed)
[facility] MD/PA/NP OP Progress Note Virtual Visit via Video Note  I connected with [name] on 05/20/21 at  2:00 PM EDT by a video enabled telemedicine application and verified that I am speaking with the correct person using two identifiers.  Location: Patient: Home Provider: Clinic   I discussed the limitations of evaluation and management by telemedicine and the availability of in person appointments. The patient expressed understanding and agreed to proceed.  I provided 16 minutes of non-face-to-face time during this encounter.       05/20/2021 2:01 PM [name]  MRN:  [id number]  Chief Complaint:  " I just got out of the hospital yesterday."  HPI: Patient informed that he was discharged from the hospital yesterday.  As per EMR he was hospitalized at [facility] from June 10 to 19.  He presented to the hospital with complaints of shortness of breath.  He was intubated for airway protection and was admitted to the ICU.  He developed atrial fibrillation.  He was extubated on June 13 however he developed cardiac arrest and needed urgent cardioversion on June 14.  He underwent TEE-guided DCCV.  Findings were limited TEE due to coughing/gagging, EF <20% with severe LV dilation, mildly decreased RV systolic function, no LA thrombus. He was cleared for discharge yesterday and returned back home and is now with his wife.  Patient stated that he is feeling tired today but feels much better. He stated that his mood has been stable for the most part.  He stated that he does not think his psychotropic medications need any adjustments.  He also informed that a few weeks after he spoke with the writer he and his wife reconsulted and he is now back with his wife.  He stated that he and his wife feel differently about each other now and is hoping that they can work through all the other issues. He plans to start marriage counseling with her soon.  He denies any other concerns or  issues today and stated that he just wants to focus on his physical health for now.   Visit Diagnosis:    ICD-10-CM   1. Bipolar 1 disorder, depressed, partial remission ([place])  F31.75     2. Panic disorder  F41.0     3. Marital conflict  F35.4       Past Psychiatric History: Bipolar disorder, anxiety, panic attacks  Past Medical History:  Past Medical History:  Diagnosis Date   Alcohol abuse    Anxiety state, unspecified    Atrial fibrillation (HCC)    CHF (congestive heart failure) (HCC)    Chronic systolic heart failure (HCC)    Diabetes mellitus, type II ([place])    Edema    Gout    History of medication noncompliance    Migraine    Obesity, unspecified    Obstructive sleep apnea    Psychiatric disorder    Pulmonary embolism ([place])    Shortness of breath     Past Surgical History:  Procedure Laterality Date   CARDIAC CATHETERIZATION     CARDIAC CATHETERIZATION N/A 08/13/2015   Procedure: Right/Left Heart Cath and Coronary Angiography;  Surgeon: [name], MD;  Location: [facility];  Service: Cardiovascular;  Laterality: N/A;   RIGHT/LEFT HEART CATH AND CORONARY ANGIOGRAPHY N/A 04/14/2017   Procedure: Right/Left Heart Cath and Coronary Angiography;  Surgeon: [name], MD;  Location: [facility];  Service: Cardiovascular;  Laterality: N/A;  TESTICLE SURGERY      Family Psychiatric History: History of depression and bipolar in siblings  Family History:  Family History  Problem Relation Age of Onset   Cancer Mother        brain tumor   Hypertension Mother    Diabetes Father        Deceased, 48   Heart disease Maternal Grandmother    Hypertension Other        Family History   Stroke Other        Family History   Diabetes Other        Family History   Diabetes Daughter     Social History:  Social History   Socioeconomic History   Marital status: Married    Spouse name: Not on file   Number of children: 3   Years of education:  Not on file   Highest education level: Not on file  Occupational History   Occupation: DISABLED  Tobacco Use   Smoking status: Former    Packs/day: 0.50    Years: 30.00    Pack years: 15.00    Types: Cigarettes    Quit date: 05/15/2020    Years since quitting: 1.0   Smokeless tobacco: Never   Tobacco comments:    vaping - nicotine-free products  Vaping Use   Vaping Use: Never used  Substance and Sexual Activity   Alcohol use: Not Currently    Alcohol/week: 0.0 standard drinks   Drug use: No   Sexual activity: Not Currently  Other Topics Concern   Not on file  Social History Narrative   He smokes about a pack per day and he has been smoking since he was 55 years of age.  He drinks alcohol occasionally, but he denies any illicit drug abuse.  He is presently on disability.    Lives with wife in a 2 story home.  Has 2 children.   Previously worked in [employer], last worked in 1998.   Highest level of education:  11th grade           Social Determinants of Health   Financial Resource Strain: Low Risk    Difficulty of Paying Living Expenses: Not hard at all  Food Insecurity: No Food Insecurity   Worried About Charity fundraiser in the Last Year: Never true   Ran Out of Food in the Last Year: Never true  Transportation Needs: No Transportation Needs   Lack of Transportation (Medical): No   Lack of Transportation (Non-Medical): No  Physical Activity: Sufficiently Active   Days of Exercise per Week: 5 days   Minutes of Exercise per Session: 30 min  Stress: No Stress Concern Present   Feeling of Stress : Not at all  Social Connections: Moderately Isolated   Frequency of Communication with Friends and Family: More than three times a week   Frequency of Social Gatherings with Friends and Family: Never   Attends Religious Services: Never   Marine scientist or Organizations: No   Attends Archivist Meetings: Never   Marital Status: Married    Allergies:   Allergies  Allergen Reactions   Ace Inhibitors Anaphylaxis and Swelling    Angioedema   Bidil [Isosorb Dinitrate-Hydralazine] Other (See Comments)    headache   Digoxin And Related     Unspecified "side effects"   Buspirone Other (See Comments)    dizziness    Metabolic Disorder Labs: Lab Results  Component Value Date   HGBA1C  6.2 (H) 05/10/2021   MPG 131 05/10/2021   MPG 119.76 02/22/2021   No results found for: PROLACTIN Lab Results  Component Value Date   CHOL 157 02/22/2021   TRIG 221 (H) 05/14/2021   HDL 37 (L) 02/22/2021   CHOLHDL 4.2 02/22/2021   VLDL 19 02/22/2021   LDLCALC 101 (H) 02/22/2021   LDLCALC 164 (H) 11/19/2020   Lab Results  Component Value Date   TSH 1.27 03/21/2020   TSH 1.54 01/19/2019    Therapeutic Level Labs: No results found for: LITHIUM No results found for: VALPROATE No components found for:  CBMZ  Current Medications: Current Outpatient Medications  Medication Sig Dispense Refill   albuterol (VENTOLIN HFA) 108 (90 Base) MCG/ACT inhaler Inhale 1-2 puffs into the lungs every 4 (four) hours as needed for shortness of breath or wheezing.     amiodarone (PACERONE) 200 MG tablet Take 1 tablet (200 mg total) by mouth 2 (two) times daily. 60 tablet 1   ARIPiprazole (ABILIFY) 10 MG tablet Take 1 tablet (10 mg total) by mouth daily. 90 tablet 0   atorvastatin (LIPITOR) 40 MG tablet Take 1 tablet (40 mg total) by mouth daily. 90 tablet 1   blood glucose meter kit and supplies KIT Use to check blood sugar. DX E11.9 1 each 0   clonazePAM (KLONOPIN) 1 MG tablet Take 1 tablet (1 mg total) by mouth 2 (two) times daily as needed for anxiety. 60 tablet 1   FARXIGA 10 MG TABS tablet TAKE 1 TABLET BY MOUTH EVERY DAY 90 tablet 1   gabapentin (NEURONTIN) 100 MG capsule Take 1 capsule (100 mg total) by mouth 3 (three) times daily. 90 capsule 0   losartan (COZAAR) 25 MG tablet Take 0.5 tablets (12.5 mg total) by mouth at bedtime. 15 tablet 6   pantoprazole  (PROTONIX) 40 MG tablet TAKE 1 TABLET BY MOUTH EVERY DAY 90 tablet 1   potassium chloride SA (KLOR-CON) 20 MEQ tablet Take 40 mEq by mouth daily as needed (cramps).     PRESCRIPTION MEDICATION Inhale into the lungs See admin instructions. Bipap, pressure 16/12 with 2L of O2 - use whenever sleeping     sertraline (ZOLOFT) 100 MG tablet Take 1 tablet (100 mg total) by mouth daily. 90 tablet 0   spironolactone (ALDACTONE) 25 MG tablet Take 0.5 tablets (12.5 mg total) by mouth daily. 30 tablet 1   thiamine 100 MG tablet Take 1 tablet (100 mg total) by mouth daily. 90 tablet 1   torsemide (DEMADEX) 20 MG tablet Take 1 tablet (20 mg total) by mouth daily. 30 tablet 1   vitamin B-12 (CYANOCOBALAMIN) 1000 MCG tablet Take 1,000 mcg by mouth daily.     XARELTO 20 MG TABS tablet TAKE 1 TABLET BY MOUTH EVERY DAY 90 tablet 1   No current facility-administered medications for this visit.       Psychiatric Specialty Exam: Review of Systems  There were no vitals taken for this visit.There is no height or weight on file to calculate BMI.  General Appearance: Fairly Groomed  Eye Contact:  Good  Speech:  Clear and Coherent and Normal Rate  Volume:  Normal  Mood:  Euthymic  Affect:  Congruent  Thought Process:  Coherent  Orientation:  Full (Time, Place, and Person)  Thought Content: WDL   Suicidal Thoughts:  No  Homicidal Thoughts:  No  Memory:  Immediate;   Good Recent;   Good  Judgement:  Fair  Insight:  Fair  Psychomotor  Activity:  Normal  Concentration:  Concentration: Good  Recall:  Good  Fund of Knowledge: Good  Language: Good  Akathisia:  No  Handed:  Right  AIMS (if indicated): not done  Assets:  Communication Skills Desire for Improvement Financial Resources/Insurance Housing Intimacy  ADL's:  Intact  Cognition: WNL  Sleep:  Fair   Screenings: PHQ2-9    Flowsheet Row Clinical Support from 01/15/2021 in [place] at [facility] Visit from 12/21/2019 in [place] at [facility] Visit from 10/12/2019 in [place] Visit from 12/16/2018 in [place] Visit from 04/29/2018 in [facility]  PHQ-2 Total Score 0 _0 0  PHQ-9 Total Score -- _1 0      Flowsheet Row ED to Hosp-Admission (Discharged) from 05/09/2021 in [facility] 57M MEDICAL ICU  C-SSRS RISK CATEGORY No Risk        Assessment and Plan: Patient was recently discharged from the hospital after staying there for 9 days.  He was intubated for several days and then went into cardiac arrest.  He is feeling better now and is happy that he is back home with his wife.  He and his wife are working on their relationship issues and are planning to start marriage counseling soon  1. Bipolar 1 disorder, depressed, partial remission (HCC)  - ARIPiprazole (ABILIFY) 10 MG tablet; Take 1 tablet (10 mg total) by mouth daily.  Dispense: 90 tablet; Refill: 0 - sertraline (ZOLOFT) 100 MG tablet; Take 1 tablet (100 mg total) by mouth daily.  Dispense: 90 tablet; Refill: 0  2. Panic disorder  - clonazePAM (KLONOPIN) 1 MG tablet; Take 1 tablet (1 mg total) by mouth 2 (two) times daily as needed for anxiety.  Dispense: 60 tablet; Refill: 2 - sertraline (ZOLOFT) 100 MG tablet; Take 1 tablet (100 mg total) by mouth daily.  Dispense: 90 tablet; Refill: 0  3. Marital conflict   - Starting marriage counseling soon  Continue same medications Follow-up in 3 months.  Patient was informed that due to the writer leaving the office his care is being transferred to a different provider at [facility] psychiatry clinic.  He verbalized his understanding.   Nevada Crane, MD 05/20/2021, 2:01 PM

## 2021-05-20 NOTE — Telephone Encounter (Signed)
Transition Care Management Follow-up Telephone Call Date of discharge and from where: 05/19/2021 from [facility] How have you been since you were released from the hospital? Still tired Any questions or concerns? No  Items Reviewed: Did the pt receive and understand the discharge instructions provided? Yes  Medications obtained and verified? Yes  Other? No  Any new allergies since your discharge? No  Dietary orders reviewed? Yes, low sodium heart healthy Do you have support at home? Yes , wife and kids  [place] and Equipment/Supplies: Were home health services ordered? no If so, what is the name of the agency? N/a  Has the agency set up a time to come to the patient's home? no Were any new equipment or medical supplies ordered?  No What is the name of the medical supply agency? N/a Were you able to get the supplies/equipment? no Do you have any questions related to the use of the equipment or supplies? No  Functional Questionnaire: (I = Independent and D = Dependent) ADLs: I  Bathing/Dressing- I  Meal Prep- I  Eating- I  Maintaining continence- I  Transferring/Ambulation- I  Managing Meds- I  Follow up appointments reviewed:  PCP Hospital f/u appt confirmed? Yes  Scheduled to see Billey Gosling, MD on 05/31/2021 @ 05/31/2021 at 3:20 pm. [facility] f/u appt confirmed? No   Are transportation arrangements needed? No  If their condition worsens, is the pt aware to call PCP or go to the Emergency Dept.? Yes Was the patient provided with contact information for the PCP's office or ED? Yes Was to pt encouraged to call back with questions or concerns? Yes /

## 2021-05-30 ENCOUNTER — Other Ambulatory Visit: Payer: Self-pay | Admitting: Internal Medicine

## 2021-05-30 DIAGNOSIS — E785 Hyperlipidemia, unspecified: Secondary | ICD-10-CM

## 2021-05-30 NOTE — Progress Notes (Signed)
Subjective:    Patient ID: [name], male    DOB: 14-Dec-1965, 55 y.o.   MRN: [id number]  HPI The patient is here for follow up from the hospital.   He has a h/o afib, HFrEF, h/o PE, bipolar, alcohol abuse, vaping.   Admitted 6/9 - 6/19   D/c to home, no HH, No equipment Needs BMP, CBC   Discharge summary is not available at this time.   ED for SOB, CP.  Had gone to [facility] 3 days prior for similar symptoms.  D-dimer wnl at [facility].  His symptoms were intermittent.  Intermittent dizziness w/ movement and CP.  He had left sided thoracic back pain so CTA was done.  No abd pain, edema.  On a/c w/o missed doses.  He was increasing his lasix dose at home w/o improvement.   No edema.  Lungs clear.    In ED - EKG showed Afib w RVR.  CXR with mild pulm vasc congestion.  He received metoprolol in the ED.  Mild AKI .  After CTA he demanded more oxygen and had inc SOB.  He was coughing up pink frothy fluid.  Was on NRB to maintain sats then transitioned to bipap.  CT w/o PE.  Evidence of multifocal PNA.  Started Abx, lasix.    He continued to deteriorate despite bipap, had more hemoptysis, ICU team took over and pt was intubated.  Had bronchoscopy due to concern for ARDS given underlying infection or vape use.    Acute respiratory failure: Due to PNA, afib/CHF Intubated in ED Was significantly overloaded and improved with IV lasix Was on pressors Extubated 6/13   Afib with RVR Has h/o PAF.  Admitted with AF/RVR, this may have triggered CHF exac 6/14 - refractory Afib/agitation - lead to vfib arrest x 1 min, reintubation, cardioversion to sinus 6/15  extubated and went back into afib TEE guided DCCV 6/16 back in NSR On amiodarone, xarelto Off dig and toprol due to bradycardia  Acute on chronic sys HF; Nonischemic, ? Etoh related EF 25-30% in 2018 - now < 20% On losartan Can restart farxiga soon  PNA: B/l infiltrates on CXR Completed abx - ceftriaxone   He had an IV in  his right hand and central line on the right side of his neck.  His right hand has been numb in his 5th finger and partially in his 4th finger.  It has gotten a little better.  No neck pain, shoulder, elbow or wrist pain.    Having chest pain - intermittently since being home.  Worse with movement and deep breaths.  Right now it does not bother him.  Sometimes it is worse when he eats and he wonders it is gas.    He is having difficulty swallowing.  He was intubated while in the hospital twice and had a TEE.  Food gets stuck in throat.  Only issues with solid food, except if he drinks water too fast.  Typically he can drink liquids w/o any issues.  He denies any improvement since being home.   Medications and allergies reviewed with patient and updated if appropriate.  Patient Active Problem List   Diagnosis Date Noted   Acute on chronic heart failure ([place])    Respiratory failure ([place]) 05/10/2021   Hemoptysis 05/10/2021   ARDS (adult respiratory distress syndrome) (HCC)    Attention deficit hyperactivity disorder (ADHD), inattentive type, mild 12/13/2020   Colon cancer screening 36/14/4315   Marital conflict 40/07/6760  Bipolar 1 disorder, depressed, partial remission ([place]) 06/27/2020   Bipolar I disorder, most recent episode depressed ([place]) 03/05/2020   Encounter for screening for HIV 01/05/2020   Hyperlipidemia with target LDL less than 100 10/12/2019   Screen for colon cancer 10/12/2019   Hidradenitis suppurativa of left axilla 10/12/2019   Benign prostatic hyperplasia without lower urinary tract symptoms 10/12/2019   Claudication of both lower extremities ([place]) 04/20/2019   Drug-induced erectile dysfunction 02/28/2019   Vitamin D deficiency disease 01/20/2019   Severe episode of recurrent major depressive disorder, with psychotic features ([place]) 12/16/2018   Bipolar 1 disorder, depressed ([place]) 12/16/2018   Osteoarthritis of both ankles and feet 11/30/2018   Diabetic neuropathy,  painful ([place]) 11/30/2018   Fatty liver disease, nonalcoholic 93/57/0177   Thiamin deficiency 04/29/2018   Cholelithiasis 04/22/2018   Hyperbilirubinemia    PE (pulmonary thromboembolism) ([place]) 04/12/2017   Panic disorder 01/15/2017   Hereditary and idiopathic peripheral neuropathy 07/16/2016   AF (paroxysmal atrial fibrillation) (HCC)    Osteoarthritis of both knees 12/27/2014   NSVT (nonsustained ventricular tachycardia) ([place]) 09/26/2013   Alcohol abuse 08/10/2013   Hypertriglyceridemia 06/29/2013   Nonischemic cardiomyopathy ([place]) 01/26/2013   Type II diabetes mellitus with manifestations ([place]) 04/13/2012   Tobacco user 02/18/2012   ED (erectile dysfunction) 07/11/2011   ATRIAL FIBRILLATION, PAROXYSMAL 09/18/2010   Morbid obesity ([place]) 06/11/2010   Essential hypertension 06/11/2010   Chronic combined systolic and diastolic CHF (congestive heart failure) ([place]) 06/11/2010   Esophageal reflux 06/11/2010   OSA (obstructive sleep apnea) with BiPap and oxygen 06/11/2010    Current Outpatient Medications on File Prior to Visit  Medication Sig Dispense Refill   albuterol (VENTOLIN HFA) 108 (90 Base) MCG/ACT inhaler Inhale 1-2 puffs into the lungs every 4 (four) hours as needed for shortness of breath or wheezing.     amiodarone (PACERONE) 200 MG tablet Take 1 tablet (200 mg total) by mouth 2 (two) times daily. 60 tablet 1   ARIPiprazole (ABILIFY) 10 MG tablet Take 1 tablet (10 mg total) by mouth daily. 90 tablet 0   atorvastatin (LIPITOR) 40 MG tablet TAKE 1 TABLET BY MOUTH EVERY DAY 90 tablet 1   blood glucose meter kit and supplies KIT Use to check blood sugar. DX E11.9 1 each 0   clonazePAM (KLONOPIN) 1 MG tablet Take 1 tablet (1 mg total) by mouth 2 (two) times daily as needed for anxiety. 60 tablet 2   FARXIGA 10 MG TABS tablet TAKE 1 TABLET BY MOUTH EVERY DAY 90 tablet 1   gabapentin (NEURONTIN) 100 MG capsule Take 1 capsule (100 mg total) by mouth 3 (three) times daily. 90 capsule  0   losartan (COZAAR) 25 MG tablet Take 0.5 tablets (12.5 mg total) by mouth at bedtime. 15 tablet 6   pantoprazole (PROTONIX) 40 MG tablet TAKE 1 TABLET BY MOUTH EVERY DAY 90 tablet 1   potassium chloride SA (KLOR-CON) 20 MEQ tablet Take 40 mEq by mouth daily as needed (cramps).     PRESCRIPTION MEDICATION Inhale into the lungs See admin instructions. Bipap, pressure 16/12 with 2L of O2 - use whenever sleeping     sertraline (ZOLOFT) 100 MG tablet Take 1 tablet (100 mg total) by mouth daily. 90 tablet 0   spironolactone (ALDACTONE) 25 MG tablet Take 0.5 tablets (12.5 mg total) by mouth daily. 30 tablet 1   thiamine 100 MG tablet Take 1 tablet (100 mg total) by mouth daily. 90 tablet 1   torsemide (DEMADEX) 20  MG tablet Take 1 tablet (20 mg total) by mouth daily. 30 tablet 1   vitamin B-12 (CYANOCOBALAMIN) 1000 MCG tablet Take 1,000 mcg by mouth daily.     XARELTO 20 MG TABS tablet TAKE 1 TABLET BY MOUTH EVERY DAY 90 tablet 1   [DISCONTINUED] pravastatin (PRAVACHOL) 40 MG tablet Take 40 mg by mouth daily.     No current facility-administered medications on file prior to visit.    Past Medical History:  Diagnosis Date   Alcohol abuse    Anxiety state, unspecified    Atrial fibrillation (HCC)    CHF (congestive heart failure) (HCC)    Chronic systolic heart failure (HCC)    Diabetes mellitus, type II (HCC)    Edema    Gout    History of medication noncompliance    Migraine    Obesity, unspecified    Obstructive sleep apnea    Psychiatric disorder    Pulmonary embolism (HCC)    Shortness of breath     Past Surgical History:  Procedure Laterality Date   CARDIAC CATHETERIZATION     CARDIAC CATHETERIZATION N/A 08/13/2015   Procedure: Right/Left Heart Cath and Coronary Angiography;  Surgeon: [name], MD;  Location: [facility];  Service: Cardiovascular;  Laterality: N/A;   RIGHT/LEFT HEART CATH AND CORONARY ANGIOGRAPHY N/A 04/14/2017   Procedure: Right/Left Heart Cath  and Coronary Angiography;  Surgeon: [name], MD;  Location: [facility];  Service: Cardiovascular;  Laterality: N/A;   TESTICLE SURGERY      Social History   Socioeconomic History   Marital status: Married    Spouse name: Not on file   Number of children: 3   Years of education: Not on file   Highest education level: Not on file  Occupational History   Occupation: DISABLED  Tobacco Use   Smoking status: Former    Packs/day: 0.50    Years: 30.00    Pack years: 15.00    Types: Cigarettes    Quit date: 05/15/2020    Years since quitting: 1.0   Smokeless tobacco: Never   Tobacco comments:    vaping - nicotine-free products  Vaping Use   Vaping Use: Never used  Substance and Sexual Activity   Alcohol use: Not Currently    Alcohol/week: 0.0 standard drinks   Drug use: No   Sexual activity: Not Currently  Other Topics Concern   Not on file  Social History Narrative   He smokes about a pack per day and he has been smoking since he was 55 years of age.  He drinks alcohol occasionally, but he denies any illicit drug abuse.  He is presently on disability.    Lives with wife in a 2 story home.  Has 2 children.   Previously worked in [employer], last worked in 1998.   Highest level of education:  11th grade           Social Determinants of Health   Financial Resource Strain: Low Risk    Difficulty of Paying Living Expenses: Not hard at all  Food Insecurity: No Food Insecurity   Worried About Charity fundraiser in the Last Year: Never true   Ran Out of Food in the Last Year: Never true  Transportation Needs: No Transportation Needs   Lack of Transportation (Medical): No   Lack of Transportation (Non-Medical): No  Physical Activity: Sufficiently Active   Days of Exercise per Week: 5 days   Minutes of  Exercise per Session: 30 min  Stress: No Stress Concern Present   Feeling of Stress : Not at all  Social Connections: Moderately Isolated   Frequency of  Communication with Friends and Family: More than three times a week   Frequency of Social Gatherings with Friends and Family: Never   Attends Religious Services: Never   Marine scientist or Organizations: No   Attends Music therapist: Never   Marital Status: Married    Family History  Problem Relation Age of Onset   Cancer Mother        brain tumor   Hypertension Mother    Diabetes Father        Deceased, 92   Heart disease Maternal Grandmother    Hypertension Other        Family History   Stroke Other        Family History   Diabetes Other        Family History   Diabetes Daughter     Review of Systems  Constitutional:  Negative for chills and fever.  HENT:  Positive for trouble swallowing.   Respiratory:  Positive for cough (occ - irritation) and shortness of breath (only when he bends over - new). Negative for wheezing.   Cardiovascular:  Positive for chest pain (chest wall pain -worse with breathing and movement). Negative for palpitations and leg swelling.  Gastrointestinal:  Positive for constipation. Negative for abdominal pain, blood in stool, diarrhea and nausea.        Gerd controlled  Neurological:  Positive for light-headedness (when he first stands up). Negative for headaches.      Objective:   Vitals:   05/31/21 1527  BP: 110/70  Pulse: 72  Temp: 98 F (36.7 C)  SpO2: 98%   BP Readings from Last 3 Encounters:  05/31/21 110/70  05/19/21 95/65  02/22/21 118/68   Wt Readings from Last 3 Encounters:  05/31/21 (!) 363 lb (164.7 kg)  05/19/21 (!) 368 lb 9.8 oz (167.2 kg)  02/22/21 (!) 384 lb 3.2 oz (174.3 kg)   Body mass index is 55.19 kg/m.   Physical Exam    Constitutional: Appears well-developed, obese No distress.  HENT:  Head: Normocephalic and atraumatic.  Neck: Neck supple. No tracheal deviation present. No thyromegaly present.  No cervical lymphadenopathy Cardiovascular: Normal rate, regular rhythm and normal heart  sounds.  No murmur heard. No carotid bruit .  Trace edema LLE, no RLE edema Pulmonary/Chest: chest wall tenderness with palpation.  Effort normal and breath sounds normal. No respiratory distress. No has no wheezes. No rales.  Abdomen: soft, NT, ND Skin: Skin is warm and dry. Not diaphoretic.  Psychiatric: Normal mood and affect. Behavior is normal.   DG Chest 1 View CLINICAL DATA:  Respiratory failure  EXAM: CHEST  1 VIEW  COMPARISON:  05/14/2021  FINDINGS: Endotracheal tube has been removed in the interval. Right jugular line is again identified and stable. Cardiac shadow remains enlarged. Lungs are well aerated bilaterally without focal infiltrate or sizable effusion.  IMPRESSION: Improved aeration bilaterally.  No acute abnormality noted.  Electronically Signed   By: Inez Catalina M.D.   On: 05/16/2021 08:26  Lab Results  Component Value Date   WBC 8.4 05/19/2021   HGB 15.6 05/19/2021   HCT 44.1 05/19/2021   PLT 206 05/19/2021   GLUCOSE 92 05/19/2021   CHOL 157 02/22/2021   TRIG 221 (H) 05/14/2021   HDL 37 (L) 02/22/2021   LDLDIRECT  114.0 03/21/2020   LDLCALC 101 (H) 02/22/2021   ALT 63 (H) 05/14/2021   AST 65 (H) 05/14/2021   NA 137 05/19/2021   K 4.0 05/19/2021   CL 100 05/19/2021   CREATININE 1.31 (H) 05/19/2021   BUN 25 (H) 05/19/2021   CO2 27 05/19/2021   TSH 1.27 03/21/2020   PSA 0.68 11/19/2020   INR 1.3 (H) 05/10/2021   HGBA1C 6.2 (H) 05/10/2021   MICROALBUR 2.1 (H) 11/19/2020      Assessment & Plan:    Acute respiratory failure - Resolved Related to CHF/fluid overload, afib and PNA Oxgyenation normal - 98% on RA here today  PNA;  Resolved completed abx  Afib, CHF: In sinus rhythm Doing well with current medication  Euvolemic on exam Sees cardiology later this month He is trying to be more active Cbc, bmp  Dysphagia: New since leaving hospital  In throat - with solid food No improvement  Will order modified barium swallow  eval  Numbness in right 4-5th fingers Unlikely related to IV since it was on other side of hand Will refer to ortho - may need EMG  Hypertension; Controlled Continue current medications  Diabetes: Lab Results  Component Value Date   HGBA1C 6.2 (H) 05/10/2021   Controlled Continue farxiga 10 mg qd     This visit occurred during the SARS-CoV-2 public health emergency.  Safety protocols were in place, including screening questions prior to the visit, additional usage of staff PPE, and extensive cleaning of exam room while observing appropriate contact time as indicated for disinfecting solutions.

## 2021-05-30 NOTE — Patient Instructions (Addendum)
  Blood work was ordered.     Medications changes include :   none  Your prescription(s) have been submitted to your pharmacy. Please take as directed and contact our office if you believe you are having problem(s) with the medication(s).   A referral was ordered for  orthopedics.       Someone from their office will call you to schedule an appointment.    A swallowing study was ordered.

## 2021-05-31 ENCOUNTER — Other Ambulatory Visit: Payer: Self-pay

## 2021-05-31 ENCOUNTER — Ambulatory Visit (INDEPENDENT_AMBULATORY_CARE_PROVIDER_SITE_OTHER): Payer: Medicare HMO | Admitting: Internal Medicine

## 2021-05-31 ENCOUNTER — Encounter: Payer: Self-pay | Admitting: Internal Medicine

## 2021-05-31 VITALS — BP 110/70 | HR 72 | Temp 98.0°F | Ht 68.0 in | Wt 363.0 lb

## 2021-05-31 DIAGNOSIS — I10 Essential (primary) hypertension: Secondary | ICD-10-CM

## 2021-05-31 DIAGNOSIS — I5023 Acute on chronic systolic (congestive) heart failure: Secondary | ICD-10-CM

## 2021-05-31 DIAGNOSIS — R200 Anesthesia of skin: Secondary | ICD-10-CM | POA: Diagnosis not present

## 2021-05-31 DIAGNOSIS — Z1211 Encounter for screening for malignant neoplasm of colon: Secondary | ICD-10-CM

## 2021-05-31 DIAGNOSIS — I480 Paroxysmal atrial fibrillation: Secondary | ICD-10-CM

## 2021-05-31 DIAGNOSIS — R4702 Dysphasia: Secondary | ICD-10-CM | POA: Diagnosis not present

## 2021-05-31 DIAGNOSIS — E118 Type 2 diabetes mellitus with unspecified complications: Secondary | ICD-10-CM | POA: Diagnosis not present

## 2021-05-31 DIAGNOSIS — J9601 Acute respiratory failure with hypoxia: Secondary | ICD-10-CM

## 2021-05-31 LAB — CBC WITH DIFFERENTIAL/PLATELET
Basophils Absolute: 0.1 10*3/uL (ref 0.0–0.1)
Basophils Relative: 1.2 % (ref 0.0–3.0)
Eosinophils Absolute: 0.2 10*3/uL (ref 0.0–0.7)
Eosinophils Relative: 2 % (ref 0.0–5.0)
HCT: 43.7 % (ref 39.0–52.0)
Hemoglobin: 15.4 g/dL (ref 13.0–17.0)
Lymphocytes Relative: 57.4 % — ABNORMAL HIGH (ref 12.0–46.0)
Lymphs Abs: 4.3 10*3/uL — ABNORMAL HIGH (ref 0.7–4.0)
MCHC: 35.2 g/dL (ref 30.0–36.0)
MCV: 85.4 fl (ref 78.0–100.0)
Monocytes Absolute: 0.7 10*3/uL (ref 0.1–1.0)
Monocytes Relative: 9.4 % (ref 3.0–12.0)
Neutro Abs: 2.2 10*3/uL (ref 1.4–7.7)
Neutrophils Relative %: 30 % — ABNORMAL LOW (ref 43.0–77.0)
Platelets: 261 10*3/uL (ref 150.0–400.0)
RBC: 5.11 Mil/uL (ref 4.22–5.81)
RDW: 14.7 % (ref 11.5–15.5)
WBC: 7.5 10*3/uL (ref 4.0–10.5)

## 2021-05-31 LAB — BASIC METABOLIC PANEL
BUN: 21 mg/dL (ref 6–23)
CO2: 27 mEq/L (ref 19–32)
Calcium: 9.5 mg/dL (ref 8.4–10.5)
Chloride: 100 mEq/L (ref 96–112)
Creatinine, Ser: 1.4 mg/dL (ref 0.40–1.50)
GFR: 57.72 mL/min — ABNORMAL LOW (ref >60.00)
Glucose, Bld: 93 mg/dL (ref 70–99)
Potassium: 3.8 mEq/L (ref 3.5–5.1)
Sodium: 136 mEq/L (ref 135–145)

## 2021-06-04 ENCOUNTER — Telehealth: Payer: Self-pay | Admitting: Pharmacist

## 2021-06-04 ENCOUNTER — Other Ambulatory Visit (HOSPITAL_COMMUNITY): Payer: Self-pay | Admitting: *Deleted

## 2021-06-04 DIAGNOSIS — R1310 Dysphagia, unspecified: Secondary | ICD-10-CM

## 2021-06-04 NOTE — Progress Notes (Addendum)
Chronic Care Management Pharmacy Assistant   Name: [name]  MRN: [id number] DOB: 1966/09/14   Reason for Encounter: Disease State   Conditions to be addressed/monitored: HTN   Recent office visits:  05/31/21 Dr. Quay [facility], no medication changes  Recent consult visits:  05/20/21 Dr. [place] [facility] visits:  Medication Reconciliation was completed by comparing discharge summary, patient's EMR and Pharmacy list, and upon discussion with patient.  Admitted to the hospital on 05/09/21 due to sob and chest pain. Discharge date was 05/19/21. Discharged from [facility]?Medications Started at [facility] Discharge:?? -started None ID  Medication Changes at Hospital Discharge: -Changed None ID  Medications Discontinued at Hospital Discharge: -Stopped magnesium sultate, lactated ringers infusion,ceftriaxone, azithromycin, sodium chloride   Medications that remain the same after Hospital Discharge:??  -All other medications will remain the same.    Medications: Outpatient Encounter Medications as of 06/04/2021  Medication Sig   albuterol (VENTOLIN HFA) 108 (90 Base) MCG/ACT inhaler Inhale 1-2 puffs into the lungs every 4 (four) hours as needed for shortness of breath or wheezing.   amiodarone (PACERONE) 200 MG tablet Take 1 tablet (200 mg total) by mouth 2 (two) times daily.   ARIPiprazole (ABILIFY) 10 MG tablet Take 1 tablet (10 mg total) by mouth daily.   atorvastatin (LIPITOR) 40 MG tablet TAKE 1 TABLET BY MOUTH EVERY DAY   blood glucose meter kit and supplies KIT Use to check blood sugar. DX E11.9   clonazePAM (KLONOPIN) 1 MG tablet Take 1 tablet (1 mg total) by mouth 2 (two) times daily as needed for anxiety.   FARXIGA 10 MG TABS tablet TAKE 1 TABLET BY MOUTH EVERY DAY   gabapentin (NEURONTIN) 100 MG capsule Take 1 capsule (100 mg total) by mouth 3 (three) times daily.   losartan (COZAAR) 25 MG tablet Take 0.5 tablets  (12.5 mg total) by mouth at bedtime.   pantoprazole (PROTONIX) 40 MG tablet TAKE 1 TABLET BY MOUTH EVERY DAY   potassium chloride SA (KLOR-CON) 20 MEQ tablet Take 40 mEq by mouth daily as needed (cramps).   PRESCRIPTION MEDICATION Inhale into the lungs See admin instructions. Bipap, pressure 16/12 with 2L of O2 - use whenever sleeping   sertraline (ZOLOFT) 100 MG tablet Take 1 tablet (100 mg total) by mouth daily.   spironolactone (ALDACTONE) 25 MG tablet Take 0.5 tablets (12.5 mg total) by mouth daily.   thiamine 100 MG tablet Take 1 tablet (100 mg total) by mouth daily.   torsemide (DEMADEX) 20 MG tablet Take 1 tablet (20 mg total) by mouth daily.   vitamin B-12 (CYANOCOBALAMIN) 1000 MCG tablet Take 1,000 mcg by mouth daily.   XARELTO 20 MG TABS tablet TAKE 1 TABLET BY MOUTH EVERY DAY   [DISCONTINUED] pravastatin (PRAVACHOL) 40 MG tablet Take 40 mg by mouth daily.   No facility-administered encounter medications on file as of 06/04/2021.    Pharmacist Review  Reviewed chart prior to disease state call. Spoke with patient regarding BP  Recent Office Vitals: BP Readings from Last 3 Encounters:  05/31/21 110/70  05/19/21 95/65  02/22/21 118/68   Pulse Readings from Last 3 Encounters:  05/31/21 72  05/19/21 (!) 54  02/22/21 (!) 59    Wt Readings from Last 3 Encounters:  05/31/21 (!) 363 lb (164.7 kg)  05/19/21 (!) 368 lb 9.8 oz (167.2 kg)  02/22/21 (!) 384 lb 3.2 oz (174.3 kg)     Kidney Function  Lab Results  Component Value Date/Time   CREATININE 1.40 05/31/2021 04:05 PM   CREATININE 1.31 (H) 05/19/2021 02:54 AM   CREATININE 1.18 08/24/2017 10:59 AM   CREATININE 1.06 01/15/2017 12:11 PM   GFR 57.72 (L) 05/31/2021 04:05 PM   GFRNONAA >60 05/19/2021 02:54 AM   GFRAA >60 05/15/2020 02:52 PM    BMP Latest Ref Rng & Units 05/31/2021 05/19/2021 05/18/2021  Glucose 70 - 99 mg/dL 93 92 123(H)  BUN 6 - 23 mg/dL 21 25(H) 29(H)  Creatinine 0.40 - 1.50 mg/dL 1.40 1.31(H) 1.21   BUN/Creat Ratio 6 - 22 (calc) - - -  Sodium 135 - 145 mEq/L 136 137 137  Potassium 3.5 - 5.1 mEq/L 3.8 4.0 3.9  Chloride 96 - 112 mEq/L 100 100 102  CO2 19 - 32 mEq/L 27 27 26   Calcium 8.4 - 10.5 mg/dL 9.5 9.3 9.0    Current antihypertensive regimen:  Spironolactone 25 mg 1/2 tab daily Losartan 25 mg 1/2 tab daily  How often are you checking your Blood Pressure? daily  Current home BP readings: Patient states that this morning his blood pressure was 110/77  What recent interventions/DTPs have been made by any provider to improve Blood Pressure control since last CPP Visit: Advised to check blood pressure daily, limit salt, and cintinue current medications  Any recent hospitalizations or ED visits since last visit with CPP? Yes, patient was seen at the hospital in June for sob and chest pain  What diet changes have been made to improve Blood Pressure Control?  Patient states that he has stopped his salt intake and he is eating less  What exercise is being done to improve your Blood Pressure Control?  The patient states that he does walk on the treadmill for about 10 daily  Adherence Review: Is the patient currently on ACE/ARB medication? Yes Does the patient have >5 day gap between last estimated fill dates? No   Star Rating Drugs: Loasrtan 05/31/21 90 ds Atorvastatin 05/30/21 90 ds  [name] Clinical Pharmacist Assistant [phone]   Time spent:39

## 2021-06-08 MED FILL — Medication: Qty: 1 | Status: AC

## 2021-06-13 ENCOUNTER — Ambulatory Visit (INDEPENDENT_AMBULATORY_CARE_PROVIDER_SITE_OTHER): Payer: Medicare HMO | Admitting: Orthopaedic Surgery

## 2021-06-13 ENCOUNTER — Encounter: Payer: Self-pay | Admitting: Orthopaedic Surgery

## 2021-06-13 VITALS — Ht 68.5 in | Wt 375.0 lb

## 2021-06-13 DIAGNOSIS — R200 Anesthesia of skin: Secondary | ICD-10-CM

## 2021-06-13 NOTE — Progress Notes (Signed)
Office Visit Note   Patient: [name]           Date of Birth: 06-24-1966           MRN: [id number] Visit Date: 06/13/2021              Requested by: Binnie Rail, MD [place],  [place] [address] PCP: [name], MD   Assessment & Plan: Visit Diagnoses:  1. Numbness of right hand     Plan: Impression is right hand paresthesias to the ulnar nerve distribution.  We have discussed the fact that this could be due to positioning likely at the elbow from when he was on the ventilator.  We will go ahead and order a nerve conduction study/EMG of the right upper extremity to assess this.  He will follow-up with [place] once this is been completed.  Call with concerns or questions in the meantime.  Follow-Up Instructions: No follow-ups on file.   Orders:  Orders Placed This Encounter  Procedures   Ambulatory referral to Physical Medicine Rehab   No orders of the defined types were placed in this encounter.     Procedures: No procedures performed   Clinical Data: No additional findings.   Subjective: Chief Complaint  Patient presents with   Right Hand - Pain, Numbness    HPI patient is a pleasant 55 year old gentleman who comes in today with right ring and small finger numbness.  He was recently hospitalized and on a ventilator where he states he had to be restrained with mittens due to agitation.  He notes that his symptoms began when he came off of the ventilator.  He initially had numbness to the entire ring and small fingers, but now notes numbness only to the ulnar side of the ring finger in addition to the entire small finger and ulnar side of the hand.  No other injury that he is aware of. Review of Systems as detailed in HPI.  All others reviewed and are negative.   Objective: Vital Signs: Ht 5' 8.5" (1.74 m)   Wt (!) 375 lb (170.1 kg)   BMI 56.19 kg/m   Physical Exam well-developed well-nourished gentleman in no acute distress.  Alert and  oriented x3.  Ortho Exam right hand exam shows decreased sensation to the ulnar side of the ring finger in addition to the entire small finger.  Negative Tinel.  Unremarkable cervical spine exam.  Specialty Comments:  No specialty comments available.  Imaging: No new imaging   PMFS History: Patient Active Problem List   Diagnosis Date Noted   Acute on chronic heart failure ([place])    Respiratory failure ([place]) 05/10/2021   Hemoptysis 05/10/2021   ARDS (adult respiratory distress syndrome) ([place])    Attention deficit hyperactivity disorder (ADHD), inattentive type, mild 12/13/2020   Colon cancer screening 31/54/0086   Marital conflict 76/19/5093   Bipolar 1 disorder, depressed, partial remission ([place]) 06/27/2020   Bipolar I disorder, most recent episode depressed ([place]) 03/05/2020   Encounter for screening for HIV 01/05/2020   Hyperlipidemia with target LDL less than 100 10/12/2019   Screen for colon cancer 10/12/2019   Hidradenitis suppurativa of left axilla 10/12/2019   Benign prostatic hyperplasia without lower urinary tract symptoms 10/12/2019   Claudication of both lower extremities ([place]) 04/20/2019   Drug-induced erectile dysfunction 02/28/2019   Vitamin D deficiency disease 01/20/2019   Severe episode of recurrent major depressive disorder, with psychotic features ([place]) 12/16/2018  Bipolar 1 disorder, depressed ([place]) 12/16/2018   Osteoarthritis of both ankles and feet 11/30/2018   Diabetic neuropathy, painful ([place]) 11/30/2018   Fatty liver disease, nonalcoholic 78/46/9629   Thiamin deficiency 04/29/2018   Cholelithiasis 04/22/2018   Hyperbilirubinemia    PE (pulmonary thromboembolism) ([place]) 04/12/2017   Panic disorder 01/15/2017   Hereditary and idiopathic peripheral neuropathy 07/16/2016   AF (paroxysmal atrial fibrillation) (HCC)    Osteoarthritis of both knees 12/27/2014   NSVT (nonsustained ventricular tachycardia) ([place]) 09/26/2013   Alcohol abuse 08/10/2013    Hypertriglyceridemia 06/29/2013   Nonischemic cardiomyopathy ([place]) 01/26/2013   Type II diabetes mellitus with manifestations ([place]) 04/13/2012   Tobacco user 02/18/2012   ED (erectile dysfunction) 07/11/2011   ATRIAL FIBRILLATION, PAROXYSMAL 09/18/2010   Morbid obesity ([place]) 06/11/2010   Essential hypertension 06/11/2010   Chronic combined systolic and diastolic CHF (congestive heart failure) ([place]) 06/11/2010   Esophageal reflux 06/11/2010   OSA (obstructive sleep apnea) with BiPap and oxygen 06/11/2010   Past Medical History:  Diagnosis Date   Alcohol abuse    Anxiety state, unspecified    Atrial fibrillation (HCC)    CHF (congestive heart failure) (HCC)    Chronic systolic heart failure (HCC)    Diabetes mellitus, type II (HCC)    Edema    Gout    History of medication noncompliance    Migraine    Obesity, unspecified    Obstructive sleep apnea    Psychiatric disorder    Pulmonary embolism (HCC)    Shortness of breath     Family History  Problem Relation Age of Onset   Cancer Mother        brain tumor   Hypertension Mother    Diabetes Father        Deceased, 39   Heart disease Maternal Grandmother    Hypertension Other        Family History   Stroke Other        Family History   Diabetes Other        Family History   Diabetes Daughter     Past Surgical History:  Procedure Laterality Date   CARDIAC CATHETERIZATION     CARDIAC CATHETERIZATION N/A 08/13/2015   Procedure: Right/Left Heart Cath and Coronary Angiography;  Surgeon: [name], MD;  Location: [facility];  Service: Cardiovascular;  Laterality: N/A;   RIGHT/LEFT HEART CATH AND CORONARY ANGIOGRAPHY N/A 04/14/2017   Procedure: Right/Left Heart Cath and Coronary Angiography;  Surgeon: [name], MD;  Location: [facility];  Service: Cardiovascular;  Laterality: N/A;   TESTICLE SURGERY     Social History   Occupational History   Occupation: DISABLED  Tobacco Use   Smoking  status: Former    Packs/day: 0.50    Years: 30.00    Pack years: 15.00    Types: Cigarettes    Quit date: 05/15/2020    Years since quitting: 1.0   Smokeless tobacco: Never   Tobacco comments:    vaping - nicotine-free products  Vaping Use   Vaping Use: Never used  Substance and Sexual Activity   Alcohol use: Not Currently    Alcohol/week: 0.0 standard drinks   Drug use: No   Sexual activity: Not Currently

## 2021-06-14 ENCOUNTER — Other Ambulatory Visit: Payer: Self-pay | Admitting: Internal Medicine

## 2021-06-14 DIAGNOSIS — E118 Type 2 diabetes mellitus with unspecified complications: Secondary | ICD-10-CM

## 2021-06-14 DIAGNOSIS — I5042 Chronic combined systolic (congestive) and diastolic (congestive) heart failure: Secondary | ICD-10-CM

## 2021-06-14 DIAGNOSIS — G4733 Obstructive sleep apnea (adult) (pediatric): Secondary | ICD-10-CM | POA: Diagnosis not present

## 2021-06-17 DIAGNOSIS — I5042 Chronic combined systolic (congestive) and diastolic (congestive) heart failure: Secondary | ICD-10-CM | POA: Diagnosis not present

## 2021-06-17 DIAGNOSIS — I2699 Other pulmonary embolism without acute cor pulmonale: Secondary | ICD-10-CM | POA: Diagnosis not present

## 2021-06-17 DIAGNOSIS — G4733 Obstructive sleep apnea (adult) (pediatric): Secondary | ICD-10-CM | POA: Diagnosis not present

## 2021-06-18 NOTE — Progress Notes (Signed)
Advanced Heart Failure Clinic Note   Primary Care:Dr. Scarlette Calico Primary Cardiologist: Dr. Aundra Dubin   HPI: Mr. Pintor is a 55 y.o. male with a past medical history of NICM, EF 25-30% in March 2018, felt to be related to prior ETOH abuse. He also has a history of PE 03/2014 completed a years course of Xarelto, morbid obesity, OSA, and tobacco abuse.    He was admitted 11/12/15-11/14/15 with acute on chronic systolic CHF and palpitations. He wore a 30 day event monitor at discharge as he had frequent PVC's and questionable Afib on telemetry. Also with some NSVT, so he was started on Amiodaone, but at follow up had not started taking it. He had previously refused ICD and was seen inpatient by Dr. Rayann Heman who felt that his morbid obesity was a prohibitive factor.    He was seen in the clinic in April 2018. He had started drinking ETOH again. Volume status was stable, he is not an Entresto candidate due to angioedema with lisinopril. Weight was 411 pounds.    Admitted 04/12/17 with SOB, chest pain. D- dimer was 1.16, chest CT without central obstructing PE, however more peripheral and subsegmental pulmonary artery branches were not confidently evaluated due to his body habitus. He was started on a heparin gtt for presumed PE, however VQ scan showed no PE. Troponin was elevated, peaked at 3.37. LHC showed no CAD. Echo showed an EF of 15%, grade 2 DD, no pericarditis. He was diuresed with IV lasix, and started on torsemide 51m at discharge. Discharge weight was 405 pounds.   Admitted 5/22 through 04/24/2018 with abdominal pain, nausea, and vomiting. 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 Did not require surgical intervention. He will have follow up with GI.   Echo in 2/21 with EF 20-25%, severe LV dilation.   He returned 3/22 for HF follow up. Using Bipap at night. No dyspnea walking on flat ground.  Notes bendopnea.  No chest pain.  No orthopnea/PND.  Weight down 2 lbs. He has quit smoking for about 4 months.  He  rarely drinks ETOH.  [place]   Presented 05/09/21 w/ SOB 2/2 acute CHF w ? PNA.  CTA showed multifocal ?PNA, no PE. Also in Afib w/ RVR in 150s on admit. Initially required bipap, developed hemoptysis and intubated. Treated w/ IV Lasix. Echo 05/10/21: LVEF < 20%. RV moderately reduced. Had improved and was extubated 6/13. Went into Afib w/ RVR. Refused to wear BiPAP. Got worse, developed severe agitation/hypoxia and re-intubated.  Went into VT>>VF arrest overnight ACLS>>ROSC after 122m. Developed refractory AFib again next morning requiring emergent cardioversion 05/16/21. He was transitioned to PO amiodarone. HR remained in 40-50's, metoprolol and sildenafil were stopped. Discharge weight 367 lbs.   Today he returns for post hospitalization HF follow up. Overall feeling fine. Having some dizziness with position changes. Denies increasing SOB, CP, edema, or PND/Orthopnea. Appetite ok. No fever or chills. Has not been weighing at home. Taking all medications. Having shakiness in hands, started 2 weeks ago.  ECG (personally reviewed): NSR, IVCD 150 msec  Labs (5/13): K 4.1, creatinine 1.05 Labs (1/14): K 3.8, creatinine 1.16, BNP 54 Labs (2/14): K 3.7, creatinine 1.11, BNP 28 Labs (2/16): K 4.1, creatinine 1.03, LDL 96, HCT 40 Labs (3/16): K 3.7, K 1.13, BNP 367 Labs (8/16): BNP 43, K 3.5, creatinine 1.01 Labs (08/13/15): K 3.7, creatinine 1.18, HCT 41.3 Labs (12/16): K 3.7, creatinine 1.14, TGs 495, digoxin < 0.2 Labs (2/17): K  3.8, creatinine 0.99, LDL 107, TGs 222 Labs (5/17): K 4, creatinine 1.47 Labs (2/18): K 3.9, creatinine 1.06, hgb 15.1, TGs 163, LDL 89, HDL 28, TSH normal  Labs (5/18): K 3.8, creatinine 1.12.  Labs (12/30/2017): K 3.8 Creatinine 1.25  Labs (01/28/2018): K 3.8 Creatinine 1.08 Labs 03/19/2018: K 4.1 Creatinine 1/15 BNP 212  Labs 04/29/3018: Creatinine 1.1  Labs (10/19): LDL 166, K 3.8, creatinine 1.01, AST 102 => 25, ALT 125 => 37, alkaline  phosphatase 233 Labs (11/20): LDL 106, TGs 232 Labs (2/21): K 3.3, creatinine 1.3 Labs (4/21): LDL 114, TGs 466, K 3.6, creatinine 1.22 Labs (12/21): K 3.5, creatinine 1.08, LDL 164, TGs 133 Labs (722): K 3.8, creatinine 1.4  PMH: 1. Nonischemic cardiomyopathy: Prior cath with no significant CAD.  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  Prior echoes with EF as low as 25%.  Echo (9/13) with EF 35-40%, moderate to severe LV dilation, diffuse hypokinesis, mild MR. Echo (5/15) with EF 30-35%, moderate to severe LAE, normal RV size and systolic function.  Angioedema with ACEI, headaches with hydralazine/nitrates. Echo (3/16) with EF 25-30%, severe LV dilation, normal RV size and systolic function.  [facility] 08/13/15 showed no significant coronary disease; RA mean 6, PA 33/11 mean 23, PCWP mean 13, Fick CO/CI 4.75 /1.68 (difficult study, radial artery spasm, if needs future cath would use groin).  Echo (9/16) showed EF 20-25%.   - Echo (3/18): EF 25-30%, moderate LAE - Echo 4/18 EF 15% - Echo 7/19 EF 20-25% - Echo 2/21 with EF 20-25%, severe LV dilation.  2. HTN: angioedema with ACEI.  3. OSA: on Bipap 4. Morbid obesity 5. Paroxysmal atrial fibrillation: Not documented recently.   6. Smoker.  7. Anxiety/panic attacks 8. PE: 5/15, diagnosed by V/Q scan. CTA chest 8/16 negative for PE.  9. NSVT, PVCs: 30 day monitor (12/16) with PVCs, PACs, no atrial fibrillation.  - Zio patch (9/19): few short NSVT runs, no atrial fibrillation, 1.1% PVCs 10. Hematuria: Apparently had negative workup by urology.  11. ABIs (6/16) were normal 12. 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  13. Gout 14. Low back pain.    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  Has son and daughter.     FH: No premature CAD.    Review of systems complete and found to be negative unless listed in HPI.    Current Outpatient Medications   Medication Sig Dispense Refill   albuterol (VENTOLIN HFA) 108 (90 Base) MCG/ACT inhaler Inhale 1-2 puffs into the lungs every 4 (four) hours as needed for shortness of breath or wheezing.     amiodarone (PACERONE) 200 MG tablet Take 1 tablet (200 mg total) by mouth 2 (two) times daily. 60 tablet 1   ARIPiprazole (ABILIFY) 10 MG tablet Take 1 tablet (10 mg total) by mouth daily. 90 tablet 0   atorvastatin (LIPITOR) 40 MG tablet TAKE 1 TABLET BY MOUTH EVERY DAY 90 tablet 1   blood glucose meter kit and supplies KIT Use to check blood sugar. DX E11.9 1 each 0   clonazePAM (KLONOPIN) 1 MG tablet Take 1 tablet (1 mg total) by mouth 2 (two) times daily as needed for anxiety. 60 tablet 2   FARXIGA 10 MG TABS tablet TAKE 1 TABLET BY MOUTH EVERY DAY 90 tablet 1   gabapentin (NEURONTIN) 100 MG capsule Take 1 capsule (100 mg total) by mouth 3 (three)  times daily. 90 capsule 0   losartan (COZAAR) 25 MG tablet Take 0.5 tablets (12.5 mg total) by mouth at bedtime. 15 tablet 6   pantoprazole (PROTONIX) 40 MG tablet TAKE 1 TABLET BY MOUTH EVERY DAY 90 tablet 1   potassium chloride SA (KLOR-CON) 20 MEQ tablet Take 40 mEq by mouth daily as needed (cramps).     PRESCRIPTION MEDICATION Inhale into the lungs See admin instructions. Bipap, pressure 16/12 with 2L of O2 - use whenever sleeping     sertraline (ZOLOFT) 100 MG tablet Take 1 tablet (100 mg total) by mouth daily. 90 tablet 0   spironolactone (ALDACTONE) 25 MG tablet Take 0.5 tablets (12.5 mg total) by mouth daily. 30 tablet 1   thiamine 100 MG tablet Take 1 tablet (100 mg total) by mouth daily. 90 tablet 1   torsemide (DEMADEX) 20 MG tablet Take 1 tablet (20 mg total) by mouth daily. 30 tablet 1   vitamin B-12 (CYANOCOBALAMIN) 1000 MCG tablet Take 1,000 mcg by mouth daily.     XARELTO 20 MG TABS tablet TAKE 1 TABLET BY MOUTH EVERY DAY 90 tablet 1   No current facility-administered medications for this encounter.    Allergies  Allergen Reactions   Ace  Inhibitors Anaphylaxis and Swelling    Angioedema   Bidil [Isosorb Dinitrate-Hydralazine] Other (See Comments)    headache   Digoxin And Related     Unspecified "side effects"   Buspirone Other (See Comments)    dizziness   Social History   Socioeconomic History   Marital status: Married    Spouse name: Not on file   Number of children: 3   Years of education: Not on file   Highest education level: Not on file  Occupational History   Occupation: DISABLED  Tobacco Use   Smoking status: Former    Packs/day: 0.50    Years: 30.00    Pack years: 15.00    Types: Cigarettes    Quit date: 05/15/2020    Years since quitting: 1.0   Smokeless tobacco: Never   Tobacco comments:    vaping - nicotine-free products  Vaping Use   Vaping Use: Never used  Substance and Sexual Activity   Alcohol use: Not Currently    Alcohol/week: 0.0 standard drinks   Drug use: No   Sexual activity: Not Currently  Other Topics Concern   Not on file  Social History Narrative   He smokes about a pack per day and he has been smoking since he was 55 years of age.  He drinks alcohol occasionally, but he denies any illicit drug abuse.  He is presently on disability.    Lives with wife in a 2 story home.  Has 2 children.   Previously worked in [employer], last worked in 1998.   Highest level of education:  11th grade           Social Determinants of Health   Financial Resource Strain: Low Risk    Difficulty of Paying Living Expenses: Not hard at all  Food Insecurity: No Food Insecurity   Worried About Charity fundraiser in the Last Year: Never true   Ran Out of Food in the Last Year: Never true  Transportation Needs: No Transportation Needs   Lack of Transportation (Medical): No   Lack of Transportation (Non-Medical): No  Physical Activity: Sufficiently Active   Days of Exercise per Week: 5 days   Minutes of Exercise per Session: 30 min  Stress: No Stress  Concern Present   Feeling of Stress : Not at  all  Social Connections: Moderately Isolated   Frequency of Communication with Friends and Family: More than three times a week   Frequency of Social Gatherings with Friends and Family: Never   Attends Religious Services: Never   Marine scientist or Organizations: No   Attends Music therapist: Never   Marital Status: Married  Human resources officer Violence: Not on file   Family History  Problem Relation Age of Onset   Cancer Mother        brain tumor   Hypertension Mother    Diabetes Father        Deceased, 55   Heart disease Maternal Grandmother    Hypertension Other        Family History   Stroke Other        Family History   Diabetes Other        Family History   Diabetes Daughter    BP 124/78   Pulse 72   Wt (!) 165.7 kg (365 lb 6.4 oz)   SpO2 97%   BMI 54.75 kg/m   Wt Readings from Last 3 Encounters:  06/19/21 (!) 165.7 kg (365 lb 6.4 oz)  06/13/21 (!) 170.1 kg (375 lb)  05/31/21 (!) 164.7 kg (363 lb)   PHYSICAL EXAM: General:  NAD. No resp difficulty HEENT: Normal Neck: Supple. No JVD. Carotids 2+ bilat; no bruits. No lymphadenopathy or thryomegaly appreciated. Cor: PMI nondisplaced. Regular rate & rhythm. No rubs, gallops or murmurs. Lungs: Clear Abdomen: Obese, nontender, nondistended. No hepatosplenomegaly. No bruits or masses. Good bowel sounds. Extremities: No cyanosis, clubbing, rash, edema Neuro: Alert & oriented x 3, cranial nerves grossly intact. Moves all 4 extremities w/o difficulty. Affect pleasant.  ASSESSMENT & PLAN: 1. Atrial fibrillation: With RVR.  Patient has history of PAF. He was admitted with AF/RVR, this may have triggered CHF exacerbation, ?tachy-mediated.  TEE-guided DCCV 6/16, now back in NSR. - Continue amiodarone 200 mg bid. (May need to back down to 200 mg day with new hand tremor. Discussed with patient and  he prefers to not decrease amio yet. Will re-address at next visit )   - Continue Xarelto. No bleeding issues.  CBC today. - Off digoxin and Toprol XL with bradycardia. 2.  Chronic systolic CHF: Nonischemic cardiomyopathy. ?If ETOH has played a role (now drinks rarely). Echo (3/18) with EF 25-30%. He was seen by EP and decided against ICD given marked obesity.  QRS not wide enough for CRT. Echo 7/19 and in 2/21 with EF 20-25%. Echo this admission with EF < 20%, normal RV.  Admitted with AF/RVR and CHF exacerbation.  Diuresed well. - NYHA II, functional status difficult due to body habitus and general physical inactivity. Volume status ok today. - Continue torsemide 20 mg daily. - Continue spironolactone 12.5 daily. - Continue Farxiga 10 mg daily. - Continue losartan 12.5 mg at night. - Off digoxin and Toprol XL with bradycardia. Noticed HR recently on watch 49, asymptomatic & non- sustained.  - No Entresto with h/o angioedema.  - Headaches with Bidil, cannot take.  - Need to keep in NSR as above. 3. PNA: Diffuse bilateral infiltrates on CXR. - Clear lung sounds on exam. 4. VT arrest: 6/13, progression from AF/RVR.  Has been turned down for ICD due to size.   - EP has seen. Continue amio for AF and VT. 5. OSA: Continue nightly BiPAP. 6. PE: 04/14/2018, diagnosed by V/Q scan.  Has been on Xarelto. 7. Anxiety/Panic attacks/depression: Followed by psychiatry. 8. Hyperlipidemia: Continue atorvastatin.  9. Hand tremor: Likely due to amio. He thinks it is from not taking his clonazepam today. Discussed with patient and he prefers waiting until next visit to decrease dose if still present.    Follow up in 4  weeks with APP.  [place] FNP 06/19/2021

## 2021-06-19 ENCOUNTER — Ambulatory Visit (HOSPITAL_COMMUNITY)
Admission: RE | Admit: 2021-06-19 | Discharge: 2021-06-19 | Disposition: A | Discharge status: Home / Self Care | Payer: Medicare HMO | Source: Ambulatory Visit | Attending: Internal Medicine | Admitting: Internal Medicine

## 2021-06-19 ENCOUNTER — Ambulatory Visit (HOSPITAL_COMMUNITY): Admission: RE | Admit: 2021-06-19 | Payer: Medicare HMO | Source: Ambulatory Visit

## 2021-06-19 ENCOUNTER — Encounter (HOSPITAL_COMMUNITY): Payer: Self-pay

## 2021-06-19 ENCOUNTER — Ambulatory Visit (HOSPITAL_COMMUNITY)
Admission: RE | Admit: 2021-06-19 | Discharge: 2021-06-19 | Disposition: A | Discharge status: Home / Self Care | Payer: Medicare HMO | Source: Ambulatory Visit | Attending: Family Medicine | Admitting: Family Medicine

## 2021-06-19 ENCOUNTER — Other Ambulatory Visit: Payer: Self-pay

## 2021-06-19 VITALS — BP 124/78 | HR 72 | Wt 365.4 lb

## 2021-06-19 DIAGNOSIS — E785 Hyperlipidemia, unspecified: Secondary | ICD-10-CM | POA: Diagnosis not present

## 2021-06-19 DIAGNOSIS — F32A Depression, unspecified: Secondary | ICD-10-CM

## 2021-06-19 DIAGNOSIS — G4733 Obstructive sleep apnea (adult) (pediatric): Secondary | ICD-10-CM | POA: Insufficient documentation

## 2021-06-19 DIAGNOSIS — Z79899 Other long term (current) drug therapy: Secondary | ICD-10-CM | POA: Insufficient documentation

## 2021-06-19 DIAGNOSIS — Z0389 Encounter for observation for other suspected diseases and conditions ruled out: Secondary | ICD-10-CM | POA: Diagnosis not present

## 2021-06-19 DIAGNOSIS — Z8679 Personal history of other diseases of the circulatory system: Secondary | ICD-10-CM | POA: Diagnosis not present

## 2021-06-19 DIAGNOSIS — Z7901 Long term (current) use of anticoagulants: Secondary | ICD-10-CM | POA: Insufficient documentation

## 2021-06-19 DIAGNOSIS — Z6841 Body Mass Index (BMI) 40.0 and over, adult: Secondary | ICD-10-CM | POA: Diagnosis not present

## 2021-06-19 DIAGNOSIS — F410 Panic disorder [episodic paroxysmal anxiety] without agoraphobia: Secondary | ICD-10-CM | POA: Insufficient documentation

## 2021-06-19 DIAGNOSIS — E519 Thiamine deficiency, unspecified: Secondary | ICD-10-CM | POA: Diagnosis not present

## 2021-06-19 DIAGNOSIS — I5042 Chronic combined systolic (congestive) and diastolic (congestive) heart failure: Secondary | ICD-10-CM | POA: Insufficient documentation

## 2021-06-19 DIAGNOSIS — I480 Paroxysmal atrial fibrillation: Secondary | ICD-10-CM | POA: Diagnosis not present

## 2021-06-19 DIAGNOSIS — Z7984 Long term (current) use of oral hypoglycemic drugs: Secondary | ICD-10-CM | POA: Diagnosis not present

## 2021-06-19 DIAGNOSIS — I110 Hypertensive heart disease with heart failure: Secondary | ICD-10-CM | POA: Diagnosis not present

## 2021-06-19 DIAGNOSIS — R4702 Dysphasia: Secondary | ICD-10-CM

## 2021-06-19 DIAGNOSIS — R251 Tremor, unspecified: Secondary | ICD-10-CM

## 2021-06-19 DIAGNOSIS — Z09 Encounter for follow-up examination after completed treatment for conditions other than malignant neoplasm: Secondary | ICD-10-CM | POA: Diagnosis not present

## 2021-06-19 DIAGNOSIS — R42 Dizziness and giddiness: Secondary | ICD-10-CM | POA: Insufficient documentation

## 2021-06-19 DIAGNOSIS — I428 Other cardiomyopathies: Secondary | ICD-10-CM | POA: Insufficient documentation

## 2021-06-19 DIAGNOSIS — F419 Anxiety disorder, unspecified: Secondary | ICD-10-CM | POA: Diagnosis not present

## 2021-06-19 DIAGNOSIS — J189 Pneumonia, unspecified organism: Secondary | ICD-10-CM | POA: Diagnosis not present

## 2021-06-19 DIAGNOSIS — R1310 Dysphagia, unspecified: Secondary | ICD-10-CM | POA: Insufficient documentation

## 2021-06-19 DIAGNOSIS — R1313 Dysphagia, pharyngeal phase: Secondary | ICD-10-CM | POA: Diagnosis not present

## 2021-06-19 DIAGNOSIS — Z86711 Personal history of pulmonary embolism: Secondary | ICD-10-CM

## 2021-06-19 DIAGNOSIS — Z8249 Family history of ischemic heart disease and other diseases of the circulatory system: Secondary | ICD-10-CM | POA: Insufficient documentation

## 2021-06-19 DIAGNOSIS — R69 Illness, unspecified: Secondary | ICD-10-CM | POA: Diagnosis not present

## 2021-06-19 DIAGNOSIS — F17210 Nicotine dependence, cigarettes, uncomplicated: Secondary | ICD-10-CM | POA: Insufficient documentation

## 2021-06-19 DIAGNOSIS — I472 Ventricular tachycardia: Secondary | ICD-10-CM | POA: Insufficient documentation

## 2021-06-19 DIAGNOSIS — J392 Other diseases of pharynx: Secondary | ICD-10-CM | POA: Diagnosis not present

## 2021-06-19 DIAGNOSIS — R1312 Dysphagia, oropharyngeal phase: Secondary | ICD-10-CM | POA: Diagnosis not present

## 2021-06-19 LAB — CBC
HCT: 44.9 % (ref 39.0–52.0)
Hemoglobin: 16.1 g/dL (ref 13.0–17.0)
MCH: 29.8 pg (ref 26.0–34.0)
MCHC: 35.9 g/dL (ref 30.0–36.0)
MCV: 83 fL (ref 80.0–100.0)
Platelets: 244 10*3/uL (ref 150–400)
RBC: 5.41 MIL/uL (ref 4.22–5.81)
RDW: 13.7 % (ref 11.5–15.5)
WBC: 8.4 10*3/uL (ref 4.0–10.5)
nRBC: 0 % (ref 0.0–0.2)

## 2021-06-19 LAB — BASIC METABOLIC PANEL
Anion gap: 11 (ref 5–15)
BUN: 18 mg/dL (ref 6–20)
CO2: 24 mmol/L (ref 22–32)
Calcium: 9 mg/dL (ref 8.9–10.3)
Chloride: 104 mmol/L (ref 98–111)
Creatinine, Ser: 1.55 mg/dL — ABNORMAL HIGH (ref 0.61–1.24)
GFR, Estimated: 54 mL/min — ABNORMAL LOW (ref >60)
Glucose, Bld: 96 mg/dL (ref 70–99)
Potassium: 3.7 mmol/L (ref 3.5–5.1)
Sodium: 139 mmol/L (ref 135–145)

## 2021-06-19 LAB — BRAIN NATRIURETIC PEPTIDE: B Natriuretic Peptide: 81.5 pg/mL (ref 0.0–100.0)

## 2021-06-19 MED ORDER — AMIODARONE HCL 200 MG PO TABS: 200.0000 mg | ORAL_TABLET | Freq: Two times a day (BID) | ORAL | 1 refills | Status: DC

## 2021-06-19 MED ORDER — THIAMINE HCL 100 MG PO TABS: 100.0000 mg | ORAL_TABLET | Freq: Every day | ORAL | 1 refills | Status: DC

## 2021-06-19 NOTE — Progress Notes (Signed)
Modified Barium Swallow Progress Note  Patient Details  Name: [name] MRN: [id number] Date of Birth: 1966-08-15  Today's Date: 06/19/2021  Modified Barium Swallow completed.  Full report located under Chart Review in the Imaging Section.  Brief recommendations include the following:  Clinical Impression  Pt presents with pharyngeal dysphagia characterized by reduced anterior laryngeal movement and reduced cricopharyngeal relaxation which consistently resulted in pyriform sinus residue across consistencies. Amount of residue increased with bolus size and with advancement of consistency. It was improved slightly with a right head turn and pt indicated that his sensation was somewhat worse with a left head turn. No laryngeal invasion was noted during the study, but pt reported frequently "feeling like [he] would drown" when consuming thin liquids quickly. A dysphagia 3 diet with thin liquids is recomended at this time with observance of swallowing precautions. Pt reported that he has been observing these precautions and has noticed some reduction of symptoms. SLP questions the etiology of cricopharyngeal dysfunction. Pt stated that he would be amenable to a trial of dysphagia treatment.   Swallow Evaluation Recommendations       SLP Diet Recommendations: Dysphagia 3 (Mech soft) solids;Thin liquid   Liquid Administration via: Cup;Straw   Medication Administration: Whole meds with liquid       Compensations: Small sips/bites;Slow rate;Follow solids with liquid   Postural Changes: Remain semi-upright after after feeds/meals (Comment);Seated upright at 90 degrees   Oral Care Recommendations: Oral care BID      Zykia [place] I. Hardin Negus, [place], [place] Office number [phone] Pager [place] 06/19/2021,1:28 PM

## 2021-06-19 NOTE — Patient Instructions (Signed)
It was great to see you today! No medication changes are needed at this time.   Labs today We will only contact you if something comes back abnormal or we need to make some changes. Otherwise no news is good news!   Your physician recommends that you schedule a follow-up appointment in: 3-4 weeks  in the Advanced Practitioners (PA/NP) Clinic    Do the following things EVERYDAY: Weigh yourself in the morning before breakfast. Write it down and keep it in a log. Take your medicines as prescribed Eat low salt foods--Limit salt (sodium) to 2000 mg per day.  Stay as active as you can everyday Limit all fluids for the day to less than 2 liters  milAt the Advanced Heart Failure Clinic, you and your health needs are our priority. As part of our continuing mission to provide you with exceptional heart care, we have created designated Provider Care Teams. These Care Teams include your primary Cardiologist (physician) and Advanced Practice Providers (APPs- Physician Assistants and Nurse Practitioners) who all work together to provide you with the care you need, when you need it.   You may see any of the following providers on your designated Care Team at your next follow up: Dr Glori Bickers Dr [name] Dr Patrice Paradise, NP Lyda Jester, [place] Ginnie Smart Audry Riles, PharmD   Please be sure to bring in all your medications bottles to every appointment.

## 2021-06-28 NOTE — Progress Notes (Signed)
    Chronic Care Management Pharmacy Assistant   Name: [name]  MRN: [id number] DOB: [birth year]   Reason for Encounter: Chart Review   Pharmacist Review  Reviewed chart for medication changes and adherence.  Recent OV, Consult or Hospital visit: 06/13/21 Dr. Frankey Shown Orthopedic Surgery Numbness of right hand No medication changes indicated  No gaps in adherence identified. Patient has follow up scheduled with pharmacy team. No further action required.   Star Rating Drugs: Loasrtan 05/31/21 90 ds Atorvastatin 05/30/21 90 ds  [name] Clinical Pharmacist Assistant [phone]   Time spent:7

## 2021-07-03 ENCOUNTER — Other Ambulatory Visit (HOSPITAL_COMMUNITY): Payer: Self-pay | Admitting: Cardiology

## 2021-07-03 ENCOUNTER — Telehealth: Payer: Self-pay | Admitting: Internal Medicine

## 2021-07-03 ENCOUNTER — Other Ambulatory Visit: Payer: Self-pay | Admitting: Internal Medicine

## 2021-07-04 ENCOUNTER — Other Ambulatory Visit: Payer: Self-pay | Admitting: Internal Medicine

## 2021-07-04 DIAGNOSIS — I5042 Chronic combined systolic (congestive) and diastolic (congestive) heart failure: Secondary | ICD-10-CM

## 2021-07-04 DIAGNOSIS — E118 Type 2 diabetes mellitus with unspecified complications: Secondary | ICD-10-CM

## 2021-07-05 ENCOUNTER — Other Ambulatory Visit: Payer: Self-pay | Admitting: Internal Medicine

## 2021-07-05 DIAGNOSIS — E785 Hyperlipidemia, unspecified: Secondary | ICD-10-CM

## 2021-07-05 MED ORDER — ATORVASTATIN CALCIUM 40 MG PO TABS: 40.0000 mg | ORAL_TABLET | Freq: Every day | ORAL | 1 refills | Status: DC

## 2021-07-08 ENCOUNTER — Telehealth: Payer: Self-pay

## 2021-07-08 ENCOUNTER — Other Ambulatory Visit: Payer: Self-pay

## 2021-07-08 ENCOUNTER — Ambulatory Visit (INDEPENDENT_AMBULATORY_CARE_PROVIDER_SITE_OTHER): Payer: Medicare HMO | Admitting: Internal Medicine

## 2021-07-08 ENCOUNTER — Encounter: Payer: Self-pay | Admitting: Internal Medicine

## 2021-07-08 VITALS — BP 120/78 | HR 73 | Temp 98.3°F | Ht 68.5 in | Wt 384.0 lb

## 2021-07-08 DIAGNOSIS — I428 Other cardiomyopathies: Secondary | ICD-10-CM

## 2021-07-08 DIAGNOSIS — I480 Paroxysmal atrial fibrillation: Secondary | ICD-10-CM

## 2021-07-08 DIAGNOSIS — I5042 Chronic combined systolic (congestive) and diastolic (congestive) heart failure: Secondary | ICD-10-CM

## 2021-07-08 DIAGNOSIS — T502X5A Adverse effect of carbonic-anhydrase inhibitors, benzothiadiazides and other diuretics, initial encounter: Secondary | ICD-10-CM | POA: Diagnosis not present

## 2021-07-08 DIAGNOSIS — I10 Essential (primary) hypertension: Secondary | ICD-10-CM

## 2021-07-08 DIAGNOSIS — E519 Thiamine deficiency, unspecified: Secondary | ICD-10-CM

## 2021-07-08 DIAGNOSIS — E876 Hypokalemia: Secondary | ICD-10-CM | POA: Insufficient documentation

## 2021-07-08 DIAGNOSIS — E118 Type 2 diabetes mellitus with unspecified complications: Secondary | ICD-10-CM

## 2021-07-08 LAB — TROPONIN I (HIGH SENSITIVITY): High Sens Troponin I: 21 ng/L (ref 2–17)

## 2021-07-08 LAB — BASIC METABOLIC PANEL
BUN: 18 mg/dL (ref 6–23)
CO2: 27 mEq/L (ref 19–32)
Calcium: 9 mg/dL (ref 8.4–10.5)
Chloride: 104 mEq/L (ref 96–112)
Creatinine, Ser: 1.37 mg/dL (ref 0.40–1.50)
GFR: 59.2 mL/min — ABNORMAL LOW (ref >60.00)
Glucose, Bld: 125 mg/dL — ABNORMAL HIGH (ref 70–99)
Potassium: 3.3 mEq/L — ABNORMAL LOW (ref 3.5–5.1)
Sodium: 141 mEq/L (ref 135–145)

## 2021-07-08 LAB — TSH: TSH: 1.53 u[IU]/mL (ref 0.35–5.50)

## 2021-07-08 LAB — BRAIN NATRIURETIC PEPTIDE: Pro B Natriuretic peptide (BNP): 142 pg/mL — ABNORMAL HIGH (ref 0.0–100.0)

## 2021-07-08 MED ORDER — SPIRONOLACTONE 25 MG PO TABS: 25.0000 mg | ORAL_TABLET | Freq: Every day | ORAL | 0 refills | Status: DC

## 2021-07-08 MED ORDER — POTASSIUM CHLORIDE CRYS ER 15 MEQ PO TBCR
15.0000 meq | EXTENDED_RELEASE_TABLET | Freq: Two times a day (BID) | ORAL | 0 refills | Status: DC
Start: 2021-07-08 — End: 2021-07-19

## 2021-07-08 MED ORDER — DAPAGLIFLOZIN PROPANEDIOL 10 MG PO TABS: 10.0000 mg | ORAL_TABLET | Freq: Every day | ORAL | 1 refills | Status: DC

## 2021-07-08 NOTE — Patient Instructions (Signed)

## 2021-07-08 NOTE — Progress Notes (Signed)
Subjective:  Patient ID: [name], male    DOB: [birth year]  Age: 55 y.o. MRN: [id number]  CC: Atrial Fibrillation, Congestive Heart Failure, and Diabetes  This visit occurred during the SARS-CoV-2 public health emergency.  Safety protocols were in place, including screening questions prior to the visit, additional usage of staff PPE, and extensive cleaning of exam room while observing appropriate contact time as indicated for disinfecting solutions.    HPI [name] presents for f/up - He complains of a several week history of shortness of breath, weight gain, palpitations, lower extremity edema, and orthostatic dizziness and lightheadedness.  He denies chest pain presyncope, or diaphoresis.  Outpatient Medications Prior to Visit  Medication Sig Dispense Refill   albuterol (VENTOLIN HFA) 108 (90 Base) MCG/ACT inhaler Inhale 1-2 puffs into the lungs every 4 (four) hours as needed for shortness of breath or wheezing.     amiodarone (PACERONE) 200 MG tablet Take 1 tablet (200 mg total) by mouth 2 (two) times daily. 60 tablet 1   ARIPiprazole (ABILIFY) 10 MG tablet Take 1 tablet (10 mg total) by mouth daily. 90 tablet 0   atorvastatin (LIPITOR) 40 MG tablet Take 1 tablet (40 mg total) by mouth daily. 100 tablet 1   blood glucose meter kit and supplies KIT Use to check blood sugar. DX E11.9 1 each 0   clonazePAM (KLONOPIN) 1 MG tablet Take 1 tablet (1 mg total) by mouth 2 (two) times daily as needed for anxiety. 60 tablet 2   gabapentin (NEURONTIN) 100 MG capsule Take 1 capsule (100 mg total) by mouth 3 (three) times daily. 90 capsule 0   losartan (COZAAR) 25 MG tablet Take 0.5 tablets (12.5 mg total) by mouth at bedtime. 15 tablet 6   pantoprazole (PROTONIX) 40 MG tablet TAKE 1 TABLET BY MOUTH EVERY DAY 90 tablet 1   PRESCRIPTION MEDICATION Inhale into the lungs See admin instructions. Bipap, pressure 16/12 with 2L of O2 - use whenever sleeping     sertraline (ZOLOFT) 100 MG tablet  Take 1 tablet (100 mg total) by mouth daily. 90 tablet 0   thiamine 100 MG tablet Take 1 tablet (100 mg total) by mouth daily. 90 tablet 1   torsemide (DEMADEX) 20 MG tablet Take 1 tablet (20 mg total) by mouth daily. 30 tablet 1   vitamin B-12 (CYANOCOBALAMIN) 1000 MCG tablet Take 1,000 mcg by mouth daily.     XARELTO 20 MG TABS tablet TAKE 1 TABLET BY MOUTH EVERY DAY 90 tablet 1   FARXIGA 10 MG TABS tablet TAKE 1 TABLET BY MOUTH EVERY DAY 90 tablet 1   potassium chloride SA (KLOR-CON) 20 MEQ tablet Take 40 mEq by mouth daily as needed (cramps).     spironolactone (ALDACTONE) 25 MG tablet Take 0.5 tablets (12.5 mg total) by mouth daily. 30 tablet 1   No facility-administered medications prior to visit.    ROS Review of Systems  Constitutional:  Positive for fatigue and unexpected weight change. Negative for diaphoresis.  Eyes:  Negative for visual disturbance.  Respiratory:  Positive for shortness of breath. Negative for cough, chest tightness and wheezing.   Cardiovascular:  Positive for palpitations and leg swelling. Negative for chest pain.  Gastrointestinal:  Negative for abdominal pain, constipation, diarrhea, nausea and vomiting.  Genitourinary: Negative.  Negative for difficulty urinating.  Musculoskeletal: Negative.   Skin: Negative.   Neurological:  Positive for dizziness and light-headedness. Negative for weakness.  Hematological:  Negative for adenopathy. Does not  bruise/bleed easily.  Psychiatric/Behavioral: Negative.   He does do quite  Objective:  BP 120/78 (BP Location: Left Arm, Patient Position: Sitting, Cuff Size: Large)   Pulse 73   Temp 98.3 F (36.8 C) (Oral)   Ht 5' 8.5" (1.74 m)   Wt (!) 384 lb (174.2 kg)   SpO2 98%   BMI 57.54 kg/m   BP Readings from Last 3 Encounters:  07/08/21 120/78  06/19/21 124/78  05/31/21 110/70    Wt Readings from Last 3 Encounters:  07/08/21 (!) 384 lb (174.2 kg)  06/19/21 (!) 365 lb 6.4 oz (165.7 kg)  06/13/21 (!) 375  lb (170.1 kg)    Physical Exam Vitals reviewed.  Constitutional:      General: He is not in acute distress.    Appearance: He is obese. He is ill-appearing. He is not toxic-appearing or diaphoretic.  HENT:     Nose: Nose normal.     Mouth/Throat:     Mouth: Mucous membranes are moist.  Eyes:     Conjunctiva/sclera: Conjunctivae normal.  Cardiovascular:     Rate and Rhythm: Normal rate and regular rhythm.     Heart sounds: No murmur heard.    Comments: EKG- NSR, 69 bpm LBBB - unchanged  Pulmonary:     Effort: Pulmonary effort is normal.     Breath sounds: No stridor. No wheezing, rhonchi or rales.  Abdominal:     General: Abdomen is protuberant. Bowel sounds are normal. There is no distension.     Palpations: Abdomen is soft. There is no hepatomegaly, splenomegaly or mass.     Tenderness: There is no abdominal tenderness. There is no guarding.     Hernia: No hernia is present.  Musculoskeletal:        General: Normal range of motion.     Cervical back: Neck supple.     Right lower leg: Pitting Edema (trace) present.     Left lower leg: Pitting Edema (trace) present.  Lymphadenopathy:     Cervical: No cervical adenopathy.  Skin:    General: Skin is warm and dry.  Neurological:     General: No focal deficit present.     Mental Status: He is alert.  Psychiatric:        Mood and Affect: Mood normal.        Behavior: Behavior normal.    Lab Results  Component Value Date   WBC 8.4 06/19/2021   HGB 16.1 06/19/2021   HCT 44.9 06/19/2021   PLT 244 06/19/2021   GLUCOSE 125 (H) 07/08/2021   CHOL 157 02/22/2021   TRIG 221 (H) 05/14/2021   HDL 37 (L) 02/22/2021   LDLDIRECT 114.0 03/21/2020   LDLCALC 101 (H) 02/22/2021   ALT 63 (H) 05/14/2021   AST 65 (H) 05/14/2021   NA 141 07/08/2021   K 3.3 (L) 07/08/2021   CL 104 07/08/2021   CREATININE 1.37 07/08/2021   BUN 18 07/08/2021   CO2 27 07/08/2021   TSH 1.53 07/08/2021   PSA 0.68 11/19/2020   INR 1.3 (H) 05/10/2021    HGBA1C 6.2 (H) 05/10/2021   MICROALBUR 2.1 (H) 11/19/2020    DG SWALLOW FUNC OP MEDICARE SPEECH PATH  Result Date: 06/19/2021 Formatting of this result is different from the original. Objective Swallowing Evaluation: Type of Study: MBS-Modified Barium Swallow Study  Patient Details Name: [name] MRN: [id number] Date of Birth: 1966/03/09 Today's Date: 06/19/2021 Time: SLP Start Time (ACUTE ONLY): 1139 -SLP Stop Time (ACUTE ONLY):  1154 SLP Time Calculation (min) (ACUTE ONLY): 15 min Past Medical History: Past Medical History: Diagnosis Date  Alcohol abuse   Anxiety state, unspecified   Atrial fibrillation (HCC)   CHF (congestive heart failure) (HCC)   Chronic systolic heart failure (HCC)   Diabetes mellitus, type II (HCC)   Edema   Gout   History of medication noncompliance   Migraine   Obesity, unspecified   Obstructive sleep apnea   Psychiatric disorder   Pulmonary embolism (HCC)   Shortness of breath  Past Surgical History: Past Surgical History: Procedure Laterality Date  CARDIAC CATHETERIZATION    CARDIAC CATHETERIZATION N/A 08/13/2015  Procedure: Right/Left Heart Cath and Coronary Angiography;  Surgeon: [name], MD;  Location: [facility];  Service: Cardiovascular;  Laterality: N/A;  RIGHT/LEFT HEART CATH AND CORONARY ANGIOGRAPHY N/A 04/14/2017  Procedure: Right/Left Heart Cath and Coronary Angiography;  Surgeon: [name], MD;  Location: [facility];  Service: Cardiovascular;  Laterality: N/A;  TESTICLE SURGERY   HPI: Pt is a 55 y/o male who presented for an outpatient MBS due to reports of persistent globus sensation since TEE and cardioversion on 05/16/21. Pt was seen by SLP in June following intubation 6/10-6/14.  Pt was discharged from SLP services on 05/17/21 on a rgeular texture diet and thin liquids with reports of improving globus sensation.  No data recorded Assessment / Plan / Recommendation CHL IP CLINICAL IMPRESSIONS 06/19/2021 Clinical Impression Pt presents  with pharyngeal dysphagia characterized by reduced anterior laryngeal movement and reduced cricopharyngeal relaxation which consistently resulted in pyriform sinus residue across consistencies. Amount of residue increased with bolus size and with advancement of consistency. It was improved slightly with a right head turn and pt indicated that his sensation was somewhat worse with a left head turn. No laryngeal invasion was noted during the study, but pt reported frequently "feeling like [he] would drown" when consuming thin liquids quickly. A dysphagia 3 diet with thin liquids is recomended at this time with observance of swallowing precautions. Pt reported that he has been observing these precautions and has noticed some reduction of symptoms. SLP questions the etiology of cricopharyngeal dysfunction. Pt stated that he would be amenable to a trial of dysphagia treatment. SLP Visit Diagnosis Dysphagia, pharyngeal phase (R13.13) Attention and concentration deficit following -- Frontal lobe and executive function deficit following -- Impact on safety and function Mild aspiration risk   CHL IP TREATMENT RECOMMENDATION 05/15/2021 Treatment Recommendations Therapy as outlined in treatment plan below   Prognosis 05/15/2021 Prognosis for Safe Diet Advancement Good Barriers to Reach Goals -- Barriers/Prognosis Comment -- CHL IP DIET RECOMMENDATION 06/19/2021 SLP Diet Recommendations Dysphagia 3 (Mech soft) solids;Thin liquid Liquid Administration via Cup;Straw Medication Administration Whole meds with liquid Compensations Small sips/bites;Slow rate;Follow solids with liquid Postural Changes Remain semi-upright after after feeds/meals (Comment);Seated upright at 90 degrees   CHL IP OTHER RECOMMENDATIONS 06/19/2021 Recommended Consults -- Oral Care Recommendations Oral care BID Other Recommendations --   CHL IP FOLLOW UP RECOMMENDATIONS 06/19/2021 Follow up Recommendations Outpatient SLP   CHL IP FREQUENCY AND DURATION 05/15/2021  Speech Therapy Frequency (ACUTE ONLY) min 1 x/week Treatment Duration 1 week      CHL IP ORAL PHASE 06/19/2021 Oral Phase WFL Oral - Pudding Teaspoon -- Oral - Pudding Cup -- Oral - Honey Teaspoon -- Oral - Honey Cup -- Oral - Nectar Teaspoon -- Oral - Nectar Cup -- Oral - Nectar Straw -- Oral - Thin Teaspoon -- Oral - Thin  Cup -- Oral - Thin Straw -- Oral - Puree -- Oral - Mech Soft -- Oral - Regular -- Oral - Multi-Consistency -- Oral - Pill -- Oral Phase - Comment --  CHL IP PHARYNGEAL PHASE 06/19/2021 Pharyngeal Phase Impaired Pharyngeal- Pudding Teaspoon -- Pharyngeal -- Pharyngeal- Pudding Cup -- Pharyngeal -- Pharyngeal- Honey Teaspoon -- Pharyngeal -- Pharyngeal- Honey Cup -- Pharyngeal -- Pharyngeal- Nectar Teaspoon -- Pharyngeal -- Pharyngeal- Nectar Cup -- Pharyngeal -- Pharyngeal- Nectar Straw -- Pharyngeal -- Pharyngeal- Thin Teaspoon -- Pharyngeal -- Pharyngeal- Thin Cup Reduced anterior laryngeal mobility;Pharyngeal residue - pyriform Pharyngeal -- Pharyngeal- Thin Straw Reduced anterior laryngeal mobility;Pharyngeal residue - pyriform Pharyngeal -- Pharyngeal- Puree Reduced anterior laryngeal mobility;Pharyngeal residue - pyriform Pharyngeal -- Pharyngeal- Mechanical Soft -- Pharyngeal -- Pharyngeal- Regular Reduced anterior laryngeal mobility;Pharyngeal residue - pyriform Pharyngeal -- Pharyngeal- Multi-consistency -- Pharyngeal -- Pharyngeal- Pill Reduced anterior laryngeal mobility;Pharyngeal residue - pyriform Pharyngeal -- Pharyngeal Comment --  CHL IP CERVICAL ESOPHAGEAL PHASE 06/19/2021 Cervical Esophageal Phase Impaired Pudding Teaspoon -- Pudding Cup -- Honey Teaspoon -- Honey Cup -- Nectar Teaspoon -- Nectar Cup -- Nectar Straw -- Thin Teaspoon Reduced cricopharyngeal relaxation Thin Cup Reduced cricopharyngeal relaxation Thin Straw Reduced cricopharyngeal relaxation Puree Reduced cricopharyngeal relaxation Mechanical Soft Reduced cricopharyngeal relaxation Regular Reduced cricopharyngeal  relaxation Multi-consistency -- Pill -- Cervical Esophageal Comment -- [name] 06/19/2021, 2:05 PM            CLINICAL DATA:  Oropharyngeal dysphagia EXAM: MODIFIED BARIUM SWALLOW TECHNIQUE: Different consistencies of barium were administered orally to the patient by the Speech Pathologist. Imaging of the pharynx was performed in the lateral projection. The radiologist was present in the fluoroscopy room for this study, providing personal supervision. FLUOROSCOPY TIME:  Fluoroscopy Time:  1 minutes and 30 seconds Radiation Exposure Index (if provided by the fluoroscopic device): 15.4 mGy Number of Acquired Spot Images: 0 COMPARISON:  None. FINDINGS: Normal pharyngeal motion with swallowing. No laryngeal penetration or aspiration. Mild residual noted in the piriform sinuses. IMPRESSION: No findings for laryngeal penetration or aspiration. Mild residual noted in the piriform sinuses. Please refer to the Speech Pathologists report for complete details and recommendations. Electronically Signed   By: Marijo Sanes M.D.   On: 06/19/2021 13:08   IMPRESSIONS     1. No LV thrombus. Left ventricular ejection fraction, by estimation, is <20%. The left ventricle has severely decreased function. The left ventricular internal cavity size was severely dilated.  2. Right ventricular systolic function is mildly reduced. The right ventricular size is normal.  3. Left atrial size was mildly dilated. No left atrial/left atrial appendage thrombus was detected.  4. The mitral valve is normal in structure. Mild mitral valve regurgitation. No evidence of mitral stenosis.  5. The aortic valve was not well visualized. Aortic valve regurgitation is mild. No aortic stenosis is present.  6. Technically difficult study, ended early due to patient cough/gag.   Assessment & Plan:   [name] was seen today for atrial fibrillation, congestive heart failure and diabetes.  Diagnoses and all orders for this visit:  Essential  hypertension- His blood pressure is adequately well controlled. -     EKG 12-Lead -     Basic metabolic panel; Future -     Basic metabolic panel -     spironolactone (ALDACTONE) 25 MG tablet; Take 1 tablet (25 mg total) by mouth daily. -     potassium chloride SA (KLOR-CON M15) 15 MEQ tablet; Take 1 tablet (15 mEq total) by  mouth 2 (two) times daily.  Chronic combined systolic and diastolic CHF (congestive heart failure) ([place])- Will increase the dose of spironolactone. -     dapagliflozin propanediol (FARXIGA) 10 MG TABS tablet; Take 1 tablet (10 mg total) by mouth daily. -     Brain natriuretic peptide; Future -     Troponin I (High Sensitivity); Future -     Troponin I (High Sensitivity) -     Brain natriuretic peptide -     spironolactone (ALDACTONE) 25 MG tablet; Take 1 tablet (25 mg total) by mouth daily.  Type II diabetes mellitus with manifestations ([place])- His blood sugars are adequately well controlled. -     dapagliflozin propanediol (FARXIGA) 10 MG TABS tablet; Take 1 tablet (10 mg total) by mouth daily. -     Basic metabolic panel; Future -     Basic metabolic panel  Paroxysmal atrial fibrillation ([place])- He has good rate and rhythm control. -     TSH; Future -     TSH  Nonischemic cardiomyopathy ([place])- His labs are reassuring.  Will increase the dose of spironolactone. -     Brain natriuretic peptide; Future -     Troponin I (High Sensitivity); Future -     Troponin I (High Sensitivity) -     Brain natriuretic peptide -     spironolactone (ALDACTONE) 25 MG tablet; Take 1 tablet (25 mg total) by mouth daily.  Thiamin deficiency  Diuretic-induced hypokalemia -     potassium chloride SA (KLOR-CON M15) 15 MEQ tablet; Take 1 tablet (15 mEq total) by mouth 2 (two) times daily.  I have discontinued Zandyr L. Done's potassium chloride SA. I have changed his [place]ade to dapagliflozin propanediol. I have also changed his spironolactone. Additionally, I am having him start on  potassium chloride SA. Lastly, I am having him maintain his blood glucose meter kit and supplies, PRESCRIPTION MEDICATION, losartan, Xarelto, pantoprazole, albuterol, vitamin B-12, torsemide, gabapentin, clonazePAM, ARIPiprazole, sertraline, thiamine, amiodarone, and atorvastatin.  Meds ordered this encounter  Medications   dapagliflozin propanediol (FARXIGA) 10 MG TABS tablet    Sig: Take 1 tablet (10 mg total) by mouth daily.    Dispense:  90 tablet    Refill:  1   spironolactone (ALDACTONE) 25 MG tablet    Sig: Take 1 tablet (25 mg total) by mouth daily.    Dispense:  90 tablet    Refill:  0   potassium chloride SA (KLOR-CON M15) 15 MEQ tablet    Sig: Take 1 tablet (15 mEq total) by mouth 2 (two) times daily.    Dispense:  180 tablet    Refill:  0     Follow-up: Return in about 3 months (around 10/08/2021).  Scarlette Calico, MD

## 2021-07-08 NOTE — Telephone Encounter (Signed)
CRITICAL LAB  Troponin level is 20.6.

## 2021-07-15 DIAGNOSIS — G4733 Obstructive sleep apnea (adult) (pediatric): Secondary | ICD-10-CM | POA: Diagnosis not present

## 2021-07-18 DIAGNOSIS — I5042 Chronic combined systolic (congestive) and diastolic (congestive) heart failure: Secondary | ICD-10-CM | POA: Diagnosis not present

## 2021-07-18 DIAGNOSIS — I2699 Other pulmonary embolism without acute cor pulmonale: Secondary | ICD-10-CM | POA: Diagnosis not present

## 2021-07-18 DIAGNOSIS — G4733 Obstructive sleep apnea (adult) (pediatric): Secondary | ICD-10-CM | POA: Diagnosis not present

## 2021-07-18 NOTE — Progress Notes (Signed)
Advanced Heart Failure Clinic Note   Primary Care:Dr. Scarlette Calico Primary Cardiologist: Dr. Aundra Dubin   HPI: [name] is a 55 y.o. male with a past medical history of NICM, EF 25-30% in March 2018, felt to be related to prior ETOH abuse. He also has a history of PE 03/2014 completed a years course of Xarelto, morbid obesity, OSA, and tobacco abuse.    He was admitted 11/12/15-11/14/15 with acute on chronic systolic CHF and palpitations. He wore a 30 day event monitor at discharge as he had frequent PVC's and questionable Afib on telemetry. Also with some NSVT, so he was started on Amiodaone, but at follow up had not started taking it. He had previously refused ICD and was seen inpatient by Dr. Rayann Heman who felt that his morbid obesity was a prohibitive factor.    He was seen in the clinic in April 2018. He had started drinking ETOH again. Volume status was stable, he is not an Entresto candidate due to angioedema with lisinopril. Weight was 411 pounds.    Admitted 04/12/17 with SOB, chest pain. D- dimer was 1.16, chest CT without central obstructing PE, however more peripheral and subsegmental pulmonary artery branches were not confidently evaluated due to his body habitus. He was started on a heparin gtt for presumed PE, however VQ scan showed no PE. Troponin was elevated, peaked at 3.37. LHC showed no CAD. Echo showed an EF of 15%, grade 2 DD, no pericarditis. He was diuresed with IV lasix, and started on torsemide 6m at discharge. Discharge weight was 405 pounds.   Admitted 5/22 through 04/24/2018 with abdominal pain, nausea, and vomiting. 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 Did not require surgical intervention. He will have follow up with GI.   Echo in 2/21 with EF 20-25%, severe LV dilation.   He returned 3/22 for HF follow up. Using Bipap at night. No dyspnea walking on flat ground.  Notes bendopnea.  No chest pain.  No orthopnea/PND.  Weight down 2 lbs. He has quit smoking for about 4 months.  He  rarely drinks ETOH.  [place]   Presented 05/09/21 w/ SOB 2/2 acute CHF w ? PNA.  CTA showed multifocal ?PNA, no PE. Also in Afib w/ RVR in 150s on admit. Initially required bipap, developed hemoptysis and intubated. Treated w/ IV Lasix. Echo 05/10/21: LVEF < 20%. RV moderately reduced. Improved and was extubated 6/13. Went into Afib w/ RVR. Refused to wear BiPAP. Developed severe agitation/hypoxia and re-intubated.  Went into VT>>VF arrest overnight ACLS>>ROSC after 134m. Developed refractory AFib again next morning requiring emergent cardioversion 05/16/21. He was transitioned to PO amiodarone. HR remained in 40-50's, metoprolol and sildenafil were stopped. Discharge weight 367 lbs.  Today he returns for HF follow up. Feels he is carrying more fluid. Unable to weigh at home w/ broken scale. Remains SOB with walking on flat ground. +orthopnea and light-headedness. Denies CP. Drinking >2L fluid/day. Eating out a lot. He has been out of his atorva for 3 weeks. Having to take extra torsemide a couple times a week, urinary response not as brisk. Asking about refill for sildenafil. Hand tremors better after decreasing amio.  ECG (personally reviewed): NSR, IVCD 150 msec  Labs (5/13): K 4.1, creatinine 1.05 Labs (1/14): K 3.8, creatinine 1.16, BNP 54 Labs (2/14): K 3.7, creatinine 1.11, BNP 28 Labs (2/16): K 4.1, creatinine 1.03, LDL 96, HCT 40 Labs (3/16): K 3.7, K 1.13, BNP 367 Labs (8/16): BNP 43, K 3.5,  creatinine 1.01 Labs (08/13/15): K 3.7, creatinine 1.18, HCT 41.3 Labs (12/16): K 3.7, creatinine 1.14, TGs 495, digoxin < 0.2 Labs (2/17): K 3.8, creatinine 0.99, LDL 107, TGs 222 Labs (5/17): K 4, creatinine 1.47 Labs (2/18): K 3.9, creatinine 1.06, hgb 15.1, TGs 163, LDL 89, HDL 28, TSH normal  Labs (5/18): K 3.8, creatinine 1.12.  Labs (12/30/2017): K 3.8 Creatinine 1.25  Labs (01/28/2018): K 3.8 Creatinine 1.08 Labs 03/19/2018: K 4.1 Creatinine 1/15 BNP 212  Labs  04/29/3018: Creatinine 1.1  Labs (10/19): LDL 166, K 3.8, creatinine 1.01, AST 102 => 25, ALT 125 => 37, alkaline phosphatase 233 Labs (11/20): LDL 106, TGs 232 Labs (2/21): K 3.3, creatinine 1.3 Labs (4/21): LDL 114, TGs 466, K 3.6, creatinine 1.22 Labs (12/21): K 3.5, creatinine 1.08, LDL 164, TGs 133 Labs (722): K 3.8, creatinine 1.4  PMH: 1. Nonischemic cardiomyopathy: Prior cath with no significant CAD.  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  Prior echoes with EF as low as 25%.  Echo (9/13) with EF 35-40%, moderate to severe LV dilation, diffuse hypokinesis, mild MR. Echo (5/15) with EF 30-35%, moderate to severe LAE, normal RV size and systolic function.  Angioedema with ACEI, headaches with hydralazine/nitrates. Echo (3/16) with EF 25-30%, severe LV dilation, normal RV size and systolic function.  [facility] 08/13/15 showed no significant coronary disease; RA mean 6, PA 33/11 mean 23, PCWP mean 13, Fick CO/CI 4.75 /1.68 (difficult study, radial artery spasm, if needs future cath would use groin).  Echo (9/16) showed EF 20-25%.   - Echo (3/18): EF 25-30%, moderate LAE - Echo 4/18 EF 15% - Echo 7/19 EF 20-25% - Echo 2/21 with EF 20-25%, severe LV dilation.  - Echo 6/22 with EF < 20%, normal RV. 2. HTN: angioedema with ACEI.  3. OSA: on Bipap 4. Morbid obesity 5. Paroxysmal atrial fibrillation: Urgent DCCV 6/22. 6. Smoker.  7. Anxiety/panic attacks 8. PE: 5/15, diagnosed by V/Q scan. CTA chest 8/16 negative for PE.  9. NSVT, PVCs: 30 day monitor (12/16) with PVCs, PACs, no atrial fibrillation.  - Zio patch (9/19): few short NSVT runs, no atrial fibrillation, 1.1% PVCs 10. Hematuria: Apparently had negative workup by urology.  11. ABIs (6/16) were normal 12. 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  13. Gout 14. Low back pain.    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  Has son and daughter.      FH: No premature CAD.    Review of systems complete and found to be negative unless listed in HPI.   Current Outpatient Medications  Medication Sig Dispense Refill   amiodarone (PACERONE) 200 MG tablet Take 1 tablet (200 mg total) by mouth 2 (two) times daily. (Patient taking differently: Take 200 mg by mouth 2 (two) times daily. Patient taking once daily) 60 tablet 1   ARIPiprazole (ABILIFY) 10 MG tablet Take 1 tablet (10 mg total) by mouth daily. 90 tablet 0   clonazePAM (KLONOPIN) 1 MG tablet Take 1 tablet (1 mg total) by mouth 2 (two) times daily as needed for anxiety. 60 tablet 2   dapagliflozin propanediol (FARXIGA) 10 MG TABS tablet Take 1 tablet (10 mg total) by mouth daily. 90 tablet 1   losartan (COZAAR) 25 MG tablet Take 0.5 tablets (12.5 mg total) by mouth at bedtime. 15 tablet 6   pantoprazole (PROTONIX) 40 MG tablet TAKE 1 TABLET BY MOUTH EVERY  DAY 90 tablet 1   potassium chloride SA (KLOR-CON M15) 15 MEQ tablet Take 1 tablet (15 mEq total) by mouth 2 (two) times daily. (Patient taking differently: Take 15 mEq by mouth 2 (two) times daily. Taking as needed) 180 tablet 0   sertraline (ZOLOFT) 100 MG tablet Take 1 tablet (100 mg total) by mouth daily. 90 tablet 0   spironolactone (ALDACTONE) 25 MG tablet Take 1 tablet (25 mg total) by mouth daily. (Patient taking differently: Take 25 mg by mouth daily. 12.5 mg daily) 90 tablet 0   thiamine 100 MG tablet Take 1 tablet (100 mg total) by mouth daily. 90 tablet 1   torsemide (DEMADEX) 20 MG tablet Take 1 tablet (20 mg total) by mouth daily. 30 tablet 1   XARELTO 20 MG TABS tablet TAKE 1 TABLET BY MOUTH EVERY DAY 90 tablet 1   albuterol (VENTOLIN HFA) 108 (90 Base) MCG/ACT inhaler Inhale 1-2 puffs into the lungs every 4 (four) hours as needed for shortness of breath or wheezing. (Patient not taking: Reported on 07/19/2021)     atorvastatin (LIPITOR) 40 MG tablet Take 1 tablet (40 mg total) by mouth daily. (Patient not taking: Reported on  07/19/2021) 100 tablet 1   blood glucose meter kit and supplies KIT Use to check blood sugar. DX E11.9 (Patient not taking: Reported on 07/19/2021) 1 each 0   gabapentin (NEURONTIN) 100 MG capsule Take 1 capsule (100 mg total) by mouth 3 (three) times daily. (Patient not taking: Reported on 07/19/2021) 90 capsule 0   PRESCRIPTION MEDICATION Inhale into the lungs See admin instructions. Bipap, pressure 16/12 with 2L of O2 - use whenever sleeping     vitamin B-12 (CYANOCOBALAMIN) 1000 MCG tablet Take 1,000 mcg by mouth daily. (Patient not taking: Reported on 07/19/2021)     No current facility-administered medications for this encounter.   Allergies  Allergen Reactions   Ace Inhibitors Anaphylaxis and Swelling    Angioedema   Bidil [Isosorb Dinitrate-Hydralazine] Other (See Comments)    headache   Digoxin And Related     Unspecified "side effects"   Buspirone Other (See Comments)    dizziness   Social History   Socioeconomic History   Marital status: Married    Spouse name: Not on file   Number of children: 3   Years of education: Not on file   Highest education level: Not on file  Occupational History   Occupation: DISABLED  Tobacco Use   Smoking status: Former    Packs/day: 0.50    Years: 30.00    Pack years: 15.00    Types: Cigarettes    Quit date: 05/15/2020    Years since quitting: 1.1   Smokeless tobacco: Never   Tobacco comments:    vaping - nicotine-free products  Vaping Use   Vaping Use: Never used  Substance and Sexual Activity   Alcohol use: Not Currently    Alcohol/week: 0.0 standard drinks   Drug use: No   Sexual activity: Not Currently  Other Topics Concern   Not on file  Social History Narrative   He smokes about a pack per day and he has been smoking since he was 55 years of age.  He drinks alcohol occasionally, but he denies any illicit drug abuse.  He is presently on disability.    Lives with wife in a 2 story home.  Has 2 children.   Previously worked  in [place], last worked in 1998.   Highest level of education:  11th grade           Social Determinants of Health   Financial Resource Strain: Low Risk    Difficulty of Paying Living Expenses: Not hard at all  Food Insecurity: No Food Insecurity   Worried About Charity fundraiser in the Last Year: Never true   Ran Out of Food in the Last Year: Never true  Transportation Needs: No Transportation Needs   Lack of Transportation (Medical): No   Lack of Transportation (Non-Medical): No  Physical Activity: Sufficiently Active   Days of Exercise per Week: 5 days   Minutes of Exercise per Session: 30 min  Stress: No Stress Concern Present   Feeling of Stress : Not at all  Social Connections: Moderately Isolated   Frequency of Communication with Friends and Family: More than three times a week   Frequency of Social Gatherings with Friends and Family: Never   Attends Religious Services: Never   Marine scientist or Organizations: No   Attends Music therapist: Never   Marital Status: Married  Human resources officer Violence: Not on file   Family History  Problem Relation Age of Onset   Cancer Mother        brain tumor   Hypertension Mother    Diabetes Father        Deceased, 37   Heart disease Maternal Grandmother    Hypertension Other        Family History   Stroke Other        Family History   Diabetes Other        Family History   Diabetes Daughter    BP 133/76   Pulse 68   Ht _0  (1.753 m)   Wt (!) 175.5 kg (387 lb)   SpO2 100%   BMI 57.15 kg/m   Wt Readings from Last 3 Encounters:  07/19/21 (!) 175.5 kg (387 lb)  07/08/21 (!) 174.2 kg (384 lb)  06/19/21 (!) 165.7 kg (365 lb 6.4 oz)   PHYSICAL EXAM: General:  NAD. No resp difficulty HEENT: Normal Neck: Supple. JVP 10. Carotids 2+ bilat; no bruits. No lymphadenopathy or thryomegaly appreciated. Cor: PMI nondisplaced. Regular rate & rhythm. No rubs, gallops or murmurs. Lungs: Clear Abdomen:  Obese, nontender, +distended. No hepatosplenomegaly. No bruits or masses. Good bowel sounds. Extremities: No cyanosis, clubbing, rash, 3+ LE edema to knees; scabbed skin tear on right shin w/ erythema, no drainage. Neuro: Alert & oriented x 3, cranial nerves grossly intact. Moves all 4 extremities w/o difficulty. Affect pleasant.  ASSESSMENT & PLAN: 1. Atrial fibrillation: With RVR.  Patient has history of PAF. He was admitted with AF/RVR, this may have triggered CHF exacerbation, ? tachy-mediated.  TEE-guided DCCV 6/16, now back in NSR on ECG today. - Continue amiodarone 200 mg daily (recently decreased due to hand tremors). - Continue Xarelto. No bleeding issues.  - Off digoxin and Toprol XL with bradycardia. 2.  Chronic systolic CHF: Nonischemic cardiomyopathy. ?If ETOH has played a role (now drinks rarely). Echo (3/18) with EF 25-30%. He was seen by EP and decided against ICD given marked obesity.  QRS not wide enough for CRT. Echo 7/19 and in 2/21 with EF 20-25%. Echo this admission 6/22 with EF < 20%, normal RV.  Admitted with AF/RVR and CHF exacerbation.  Diuresed well. - NYHA III-IIIb. He is markedly volume overloaded on exam, weight up 20 lbs, likely due to fluid and sodium indiscretion. - Take metolazone 2.5 mg + 40  KCl x 2 days. - Increase torsemide to 60 mg bid x 3 days, then back to 20 mg daily. - Increase spironolactone to 25 mg daily. - Start 40 mEq KCl daily. - Continue Farxiga 10 mg daily. - Continue losartan 12.5 mg at night. - Off digoxin and Toprol XL with bradycardia.  - No Entresto with h/o angioedema.  - Headaches with Bidil, cannot take.  - Need to keep in NSR as above. 3. HTN: Elevated today. Medication changes as above. 4. PNA: Diffuse bilateral infiltrates on CXR. Oxygen saturation 100%.  - Clear lung sounds on exam. 5. VT arrest: 6/13, progression from AF/RVR.  Has been turned down for ICD due to size.   - EP has seen. Continue amio for AF and VT.  6. OSA:  Continue nightly BiPAP. 7. PE: 04/14/2018, diagnosed by V/Q scan. Has been on Xarelto. 8. Anxiety/Panic attacks/depression: Followed by psychiatry. 9. Hyperlipidemia: Continue atorvastatin. Will refill today.  - Check lipids in 6-8 weeks. 10. Cellulitis: He bumped his right leg a few days ago. Lesion with erythema and worsening LE edema. No drainage. - Will give 5 days of cephalexin.  Follow up with APP next week. If volume not better, consider IV lasix in clinic.  Farley FNP 07/19/2021

## 2021-07-19 ENCOUNTER — Ambulatory Visit (HOSPITAL_COMMUNITY)
Admission: RE | Admit: 2021-07-19 | Discharge: 2021-07-19 | Disposition: A | Discharge status: Home / Self Care | Payer: Medicare HMO | Source: Ambulatory Visit | Attending: Family Medicine | Admitting: Family Medicine

## 2021-07-19 ENCOUNTER — Telehealth: Payer: Self-pay | Admitting: Pharmacist

## 2021-07-19 ENCOUNTER — Encounter (HOSPITAL_COMMUNITY): Payer: Self-pay

## 2021-07-19 ENCOUNTER — Other Ambulatory Visit: Payer: Self-pay

## 2021-07-19 VITALS — BP 133/76 | HR 68 | Ht 69.0 in | Wt 387.0 lb

## 2021-07-19 DIAGNOSIS — L03115 Cellulitis of right lower limb: Secondary | ICD-10-CM

## 2021-07-19 DIAGNOSIS — Z8249 Family history of ischemic heart disease and other diseases of the circulatory system: Secondary | ICD-10-CM | POA: Insufficient documentation

## 2021-07-19 DIAGNOSIS — R001 Bradycardia, unspecified: Secondary | ICD-10-CM | POA: Diagnosis not present

## 2021-07-19 DIAGNOSIS — Z7901 Long term (current) use of anticoagulants: Secondary | ICD-10-CM | POA: Diagnosis not present

## 2021-07-19 DIAGNOSIS — I5042 Chronic combined systolic (congestive) and diastolic (congestive) heart failure: Secondary | ICD-10-CM

## 2021-07-19 DIAGNOSIS — I480 Paroxysmal atrial fibrillation: Secondary | ICD-10-CM | POA: Insufficient documentation

## 2021-07-19 DIAGNOSIS — F419 Anxiety disorder, unspecified: Secondary | ICD-10-CM | POA: Diagnosis not present

## 2021-07-19 DIAGNOSIS — I5022 Chronic systolic (congestive) heart failure: Secondary | ICD-10-CM | POA: Diagnosis not present

## 2021-07-19 DIAGNOSIS — Z79899 Other long term (current) drug therapy: Secondary | ICD-10-CM | POA: Insufficient documentation

## 2021-07-19 DIAGNOSIS — J189 Pneumonia, unspecified organism: Secondary | ICD-10-CM | POA: Insufficient documentation

## 2021-07-19 DIAGNOSIS — Z86711 Personal history of pulmonary embolism: Secondary | ICD-10-CM

## 2021-07-19 DIAGNOSIS — Z8679 Personal history of other diseases of the circulatory system: Secondary | ICD-10-CM | POA: Diagnosis not present

## 2021-07-19 DIAGNOSIS — R519 Headache, unspecified: Secondary | ICD-10-CM | POA: Insufficient documentation

## 2021-07-19 DIAGNOSIS — I472 Ventricular tachycardia: Secondary | ICD-10-CM | POA: Diagnosis not present

## 2021-07-19 DIAGNOSIS — Z8701 Personal history of pneumonia (recurrent): Secondary | ICD-10-CM | POA: Diagnosis not present

## 2021-07-19 DIAGNOSIS — G4733 Obstructive sleep apnea (adult) (pediatric): Secondary | ICD-10-CM | POA: Diagnosis not present

## 2021-07-19 DIAGNOSIS — I10 Essential (primary) hypertension: Secondary | ICD-10-CM | POA: Diagnosis not present

## 2021-07-19 DIAGNOSIS — I428 Other cardiomyopathies: Secondary | ICD-10-CM | POA: Diagnosis not present

## 2021-07-19 DIAGNOSIS — I110 Hypertensive heart disease with heart failure: Secondary | ICD-10-CM | POA: Insufficient documentation

## 2021-07-19 DIAGNOSIS — E785 Hyperlipidemia, unspecified: Secondary | ICD-10-CM

## 2021-07-19 DIAGNOSIS — R69 Illness, unspecified: Secondary | ICD-10-CM | POA: Diagnosis not present

## 2021-07-19 DIAGNOSIS — Z7984 Long term (current) use of oral hypoglycemic drugs: Secondary | ICD-10-CM | POA: Diagnosis not present

## 2021-07-19 DIAGNOSIS — Z87891 Personal history of nicotine dependence: Secondary | ICD-10-CM | POA: Insufficient documentation

## 2021-07-19 LAB — BASIC METABOLIC PANEL
Anion gap: 8 (ref 5–15)
BUN: 14 mg/dL (ref 6–20)
CO2: 26 mmol/L (ref 22–32)
Calcium: 8.6 mg/dL — ABNORMAL LOW (ref 8.9–10.3)
Chloride: 101 mmol/L (ref 98–111)
Creatinine, Ser: 1.23 mg/dL (ref 0.61–1.24)
GFR, Estimated: >60 mL/min (ref >60)
Glucose, Bld: 86 mg/dL (ref 70–99)
Potassium: 3.8 mmol/L (ref 3.5–5.1)
Sodium: 135 mmol/L (ref 135–145)

## 2021-07-19 LAB — BRAIN NATRIURETIC PEPTIDE: B Natriuretic Peptide: 98.3 pg/mL (ref 0.0–100.0)

## 2021-07-19 MED ORDER — POTASSIUM CHLORIDE CRYS ER 20 MEQ PO TBCR: 40.0000 meq | EXTENDED_RELEASE_TABLET | Freq: Every day | ORAL | 11 refills | Status: DC

## 2021-07-19 MED ORDER — SPIRONOLACTONE 25 MG PO TABS: 25.0000 mg | ORAL_TABLET | Freq: Every day | ORAL | 0 refills | Status: DC

## 2021-07-19 MED ORDER — TORSEMIDE 20 MG PO TABS: 60.0000 mg | ORAL_TABLET | Freq: Every day | ORAL | 5 refills | Status: DC

## 2021-07-19 MED ORDER — CEPHALEXIN 250 MG PO CAPS: 250.0000 mg | ORAL_CAPSULE | Freq: Four times a day (QID) | ORAL | 0 refills | Status: AC

## 2021-07-19 MED ORDER — METOLAZONE 2.5 MG PO TABS: 2.5000 mg | ORAL_TABLET | Freq: Every day | ORAL | 0 refills | Status: DC

## 2021-07-19 NOTE — Patient Instructions (Addendum)
EKG done today.  Labs done today. We will contact you only if your labs are abnormal.  INCREASE Spironolactone to '25mg'$  (1 tablet) by mouth daily.   START Metolazone 2.'5mg'$  (1 tablet) by mouth daily for 2 days.  INCREASE Potassium to 23mq(2 tablets) daily.  INCREASE Torsemide to '60mg'$  (3 tablets) by mouth 2 times daily for 3 days THEN DECREASE to '60mg'$  (3 tablets) by mouth daily.   START Keflex '250mg'$  (1 tablet) by mouth 4 times daily for 5 days.  No other medication changes were made. Please continue all current medications as prescribed.  Your physician recommends that you schedule a follow-up appointment in: 7-10 days with our APP Clinic here in our office.   If you have any questions or concerns before your next appointment please send uKoreaa message through mSalemor call our office at [phone]    TO LEAVE A MESSAGE FOR THE NURSE SELECT OPTION 2, PLEASE LEAVE A MESSAGE INCLUDING: YOUR NAME DATE OF BIRTH CALL BACK NUMBER REASON FOR CALL**this is important as we prioritize the call backs  YOU WILL RECEIVE A CALL BACK THE SAME DAY AS LONG AS YOU CALL BEFORE 4:00 PM   Do the following things EVERYDAY: Weigh yourself in the morning before breakfast. Write it down and keep it in a log. Take your medicines as prescribed Eat low salt foods--Limit salt (sodium) to 2000 mg per day.  Stay as active as you can everyday Limit all fluids for the day to less than 2 liters   [facility] you and your health needs are our priority. As part of our continuing mission to provide you with exceptional heart care, we have created designated Provider Care Teams. These Care Teams include your primary Cardiologist (physician) and Advanced Practice Providers (APPs- Physician Assistants and Nurse Practitioners) who all work together to provide you with the care you need, when you need it.   You may see any of the following providers on your designated Care Team at your next  follow up: Dr DGlori BickersDr DHaynes Kerns NP BLyda Jester PUtahLAudry Riles PharmD   Please be sure to bring in all your medications bottles to every appointment.

## 2021-07-19 NOTE — Progress Notes (Addendum)
Chronic Care Management Pharmacy Assistant   Name: [name]  MRN: [id number] DOB: [birth year]   Reason for Encounter: Disease State   Conditions to be addressed/monitored: HTN  Recent office visits:  07/08/21 [name], MD OV: F/U - changed Kcl to 15 meq BID. Increased spironolactone to 25 mg.  Recent consult visits:  06/19/21 Chronic combined systolic and diastolic CHF- Cardiology 3/53/29 Leandrew Koyanagi, MD-Orthopedic Surgery (Numbness of right hand)  Hospital visits:  05/09/21-05/19/21 Heart Failure at [facility] START taking: amiodarone (PACERONE) gabapentin (NEURONTIN)  CHANGE how you take: Wilder Glade (dapagliflozin propanediol) pantoprazole (PROTONIX) spironolactone (ALDACTONE) torsemide (DEMADEX) Xarelto  STOP taking: metoprolol succinate 100 MG 24 hr tablet (TOPROL-XL) sildenafil 20 MG tablet (REVATIO)  Medications: Outpatient Encounter Medications as of 07/19/2021  Medication Sig   albuterol (VENTOLIN HFA) 108 (90 Base) MCG/ACT inhaler Inhale 1-2 puffs into the lungs every 4 (four) hours as needed for shortness of breath or wheezing.   amiodarone (PACERONE) 200 MG tablet Take 1 tablet (200 mg total) by mouth 2 (two) times daily.   ARIPiprazole (ABILIFY) 10 MG tablet Take 1 tablet (10 mg total) by mouth daily.   atorvastatin (LIPITOR) 40 MG tablet Take 1 tablet (40 mg total) by mouth daily.   blood glucose meter kit and supplies KIT Use to check blood sugar. DX E11.9   clonazePAM (KLONOPIN) 1 MG tablet Take 1 tablet (1 mg total) by mouth 2 (two) times daily as needed for anxiety.   dapagliflozin propanediol (FARXIGA) 10 MG TABS tablet Take 1 tablet (10 mg total) by mouth daily.   gabapentin (NEURONTIN) 100 MG capsule Take 1 capsule (100 mg total) by mouth 3 (three) times daily.   losartan (COZAAR) 25 MG tablet Take 0.5 tablets (12.5 mg total) by mouth at bedtime.   pantoprazole (PROTONIX) 40 MG tablet TAKE 1 TABLET BY MOUTH EVERY DAY   potassium  chloride SA (KLOR-CON M15) 15 MEQ tablet Take 1 tablet (15 mEq total) by mouth 2 (two) times daily.   PRESCRIPTION MEDICATION Inhale into the lungs See admin instructions. Bipap, pressure 16/12 with 2L of O2 - use whenever sleeping   sertraline (ZOLOFT) 100 MG tablet Take 1 tablet (100 mg total) by mouth daily.   spironolactone (ALDACTONE) 25 MG tablet Take 1 tablet (25 mg total) by mouth daily.   thiamine 100 MG tablet Take 1 tablet (100 mg total) by mouth daily.   torsemide (DEMADEX) 20 MG tablet Take 1 tablet (20 mg total) by mouth daily.   vitamin B-12 (CYANOCOBALAMIN) 1000 MCG tablet Take 1,000 mcg by mouth daily.   XARELTO 20 MG TABS tablet TAKE 1 TABLET BY MOUTH EVERY DAY   [DISCONTINUED] pravastatin (PRAVACHOL) 40 MG tablet Take 40 mg by mouth daily.   No facility-administered encounter medications on file as of 07/19/2021.    Recent Office Vitals: BP Readings from Last 3 Encounters:  07/08/21 120/78  06/19/21 124/78  05/31/21 110/70   Pulse Readings from Last 3 Encounters:  07/08/21 73  06/19/21 72  05/31/21 72    Wt Readings from Last 3 Encounters:  07/08/21 (!) 384 lb (174.2 kg)  06/19/21 (!) 365 lb 6.4 oz (165.7 kg)  06/13/21 (!) 375 lb (170.1 kg)     Kidney Function Lab Results  Component Value Date/Time   CREATININE 1.37 07/08/2021 11:58 AM   CREATININE 1.55 (H) 06/19/2021 04:18 PM   CREATININE 1.18 08/24/2017 10:59 AM   CREATININE 1.06 01/15/2017 12:11 PM   GFR 59.20 (  L) 07/08/2021 11:58 AM   GFRNONAA 54 (L) 06/19/2021 04:18 PM   GFRAA >60 05/15/2020 02:52 PM    BMP Latest Ref Rng & Units 07/08/2021 06/19/2021 05/31/2021  Glucose 70 - 99 mg/dL 125(H) 96 93  BUN 6 - 23 mg/dL 18 18 21   Creatinine 0.40 - 1.50 mg/dL 1.37 1.55(H) 1.40  BUN/Creat Ratio 6 - 22 (calc) - - -  Sodium 135 - 145 mEq/L 141 139 136  Potassium 3.5 - 5.1 mEq/L 3.3(L) 3.7 3.8  Chloride 96 - 112 mEq/L 104 104 100  CO2 19 - 32 mEq/L 27 24 27   Calcium 8.4 - 10.5 mg/dL 9.0 9.0 9.5      Contacted patient on 07/19/21 to discuss hypertension disease state  Current antihypertensive regimen:  Spironolactone 25 mg 1/2 tab daily Losartan 25 mg 1/2 tab daily  Patient verbally confirms he is taking the above medications as directed. Yes  How often are you checking your Blood Pressure?  Patient states that he does not have a cuff now to check blood pressure. But he states that he has an appt today at Dr. Waldon Reining and will have it checked there  he checks his blood pressure  not at all  before taking his medication.  Current home BP readings: Patient does not have any readings at home  DATE:             BP               PULSE     Wrist or arm cuff:Patient states he needs a cuff Caffeine intake:occasionally one cup of coffee Salt intake:watching salt intake because of swelling OTC medications including pseudoephedrine or NSAIDs?No  Any readings above 180/120? No If yes any symptoms of hypertensive emergency? patient denies any symptoms of high blood pressure   What recent interventions/DTPs have been made by any provider to improve Blood Pressure control since last CPP Visit: none noted  Any recent hospitalizations or ED visits since last visit with CPP? No  What diet changes have been made to improve Blood Pressure Control?  Patient states that he is maintaining diet with baking and broiling his foods  What exercise is being done to improve your Blood Pressure Control?  Patient states that he walks occasionally  Adherence Review: Is the patient currently on ACE/ARB medication? Yes Does the patient have >5 day gap between last estimated fill dates? No   Star Rating Drugs:  Medication:  Last Fill: Day Supply Losartan  05/31/21  90 Atorvastatin  07/05/21            90  Care Gaps: Last annual wellness visit:11/19/20   CCM appointment on [place] Pharmacist Assistant [phone]   Time spent:36

## 2021-07-26 ENCOUNTER — Telehealth (HOSPITAL_COMMUNITY): Payer: Self-pay

## 2021-07-26 NOTE — Telephone Encounter (Signed)
Called and left a detailed voice message to confirm/remind patient of their appointment at the [facility] on 07/29/21.   Patient reminded to bring all medications and/or complete list.  Confirmed patient has transportation. Gave directions, instructed to utilize [place] parking.  Confirmed appointment prior to ending call.

## 2021-07-27 NOTE — Progress Notes (Signed)
Advanced Heart Failure Clinic Note   Primary Care:Dr. Thomas Jones Primary Cardiologist: Dr. McLean   HPI: [name] is a 55 y.o. male with a past medical history of NICM, EF 25-30% in March 2018, felt to be related to prior ETOH abuse. He also has a history of PE 03/2014 completed a years course of Xarelto, morbid obesity, OSA, and tobacco abuse.    He was admitted 11/12/15-11/14/15 with acute on chronic systolic CHF and palpitations. He wore a 30 day event monitor at discharge as he had frequent PVC's and questionable Afib on telemetry. Also with some NSVT, so he was started on Amiodaone, but at follow up had not started taking it. He had previously refused ICD and was seen inpatient by Dr. Allred who felt that his morbid obesity was a prohibitive factor.    He was seen in the clinic in April 2018. He had started drinking ETOH again. Volume status was stable, he is not an Entresto candidate due to angioedema with lisinopril. Weight was 411 pounds.    Admitted 04/12/17 with SOB, chest pain. D- dimer was 1.16, chest CT without central obstructing PE, however more peripheral and subsegmental pulmonary artery branches were not confidently evaluated due to his body habitus. He was started on a heparin gtt for presumed PE, however VQ scan showed no PE. Troponin was elevated, peaked at 3.37. LHC showed no CAD. Echo showed an EF of 15%, grade 2 DD, no pericarditis. He was diuresed with IV lasix, and started on torsemide 20mg at discharge. Discharge weight was 405 pounds.   Admitted 5/22 through 04/24/2018 with abdominal pain, nausea, and vomiting. 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 Did not require surgical intervention. He will have follow up with GI.   Echo in 2/21 with EF 20-25%, severe LV dilation.   He returned 3/22 for HF follow up. Using Bipap at night. No dyspnea walking on flat ground.  Notes bendopnea.  No chest pain.  No orthopnea/PND.  Weight down 2 lbs. He has quit smoking for about 4 months.  He  rarely drinks ETOH.  He has remarried and now lives in [place].   Presented 05/09/21 w/ SOB 2/2 acute CHF w ? PNA.  CTA showed multifocal ?PNA, no PE. Also in Afib w/ RVR in 150s on admit. Initially required bipap, developed hemoptysis and intubated. Treated w/ IV Lasix. Echo 05/10/21: LVEF < 20%. RV moderately reduced. Improved and was extubated 6/13. Went into Afib w/ RVR. Refused to wear BiPAP. Developed severe agitation/hypoxia and re-intubated.  Went into VT>>VF arrest overnight ACLS>>ROSC after 1min. Developed refractory AFib again next morning requiring emergent cardioversion 05/16/21. He was transitioned to PO amiodarone. HR remained in 40-50's, metoprolol and sildenafil were stopped. Discharge weight 367 lbs.  Today he returns for HF follow up. Feeling better, no longer SOB walking on flat ground. Does have +bendopnea. Denies CP, dizziness, edema, or PND/Orthopnea. Appetite ok. No fever or chills. Unable to weigh at home. Taking all medications. Cutting back on drinking excessive fluid and not eating out as much. Still has not picked  up his atorvastatin. He tells me his RLE wound is healing. Asking about sildenafil refill.  ECG (personally reviewed): None ordered today.  Labs (5/13): K 4.1, creatinine 1.05 Labs (1/14): K 3.8, creatinine 1.16, BNP 54 Labs (2/14): K 3.7, creatinine 1.11, BNP 28 Labs (2/16): K 4.1, creatinine 1.03, LDL 96, HCT 40 Labs (3/16): K 3.7, K 1.13, BNP 367 Labs (8/16): BNP 43, K 3.5, creatinine 1.01 Labs (08/13/15): K 3.7,   creatinine 1.18, HCT 41.3 Labs (12/16): K 3.7, creatinine 1.14, TGs 495, digoxin < 0.2 Labs (2/17): K 3.8, creatinine 0.99, LDL 107, TGs 222 Labs (5/17): K 4, creatinine 1.47 Labs (2/18): K 3.9, creatinine 1.06, hgb 15.1, TGs 163, LDL 89, HDL 28, TSH normal  Labs (5/18): K 3.8, creatinine 1.12.  Labs (12/30/2017): K 3.8 Creatinine 1.25  Labs (01/28/2018): K 3.8 Creatinine 1.08 Labs 03/19/2018: K 4.1 Creatinine 1/15 BNP 212  Labs 04/29/3018: Creatinine  1.1  Labs (10/19): LDL 166, K 3.8, creatinine 1.01, AST 102 => 25, ALT 125 => 37, alkaline phosphatase 233 Labs (11/20): LDL 106, TGs 232 Labs (2/21): K 3.3, creatinine 1.3 Labs (4/21): LDL 114, TGs 466, K 3.6, creatinine 1.22 Labs (12/21): K 3.5, creatinine 1.08, LDL 164, TGs 133 Labs (722): K 3.8, creatinine 1.4 Labs (8/22): K 3.8, creatinine 1.23  PMH: 1. Nonischemic cardiomyopathy: Prior cath with no significant CAD.  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  Prior echoes with EF as low as 25%.  Echo (9/13) with EF 35-40%, moderate to severe LV dilation, diffuse hypokinesis, mild MR. Echo (5/15) with EF 30-35%, moderate to severe LAE, normal RV size and systolic function.  Angioedema with ACEI, headaches with hydralazine/nitrates. Echo (3/16) with EF 25-30%, severe LV dilation, normal RV size and systolic function.  R/LHC 08/13/15 showed no significant coronary disease; RA mean 6, PA 33/11 mean 23, PCWP mean 13, Fick CO/CI 4.75 /1.68 (difficult study, radial artery spasm, if needs future cath would use groin).  Echo (9/16) showed EF 20-25%.   - Echo (3/18): EF 25-30%, moderate LAE - Echo 4/18 EF 15% - Echo 7/19 EF 20-25% - Echo 2/21 with EF 20-25%, severe LV dilation.  - Echo 6/22 with EF < 20%, normal RV. 2. HTN: angioedema with ACEI.  3. OSA: on Bipap 4. Morbid obesity 5. Paroxysmal atrial fibrillation: Urgent DCCV 6/22. 6. Smoker.  7. Anxiety/panic attacks 8. PE: 5/15, diagnosed by V/Q scan. CTA chest 8/16 negative for PE.  9. NSVT, PVCs: 30 day monitor (12/16) with PVCs, PACs, no atrial fibrillation.  - Zio patch (9/19): few short NSVT runs, no atrial fibrillation, 1.1% PVCs 10. Hematuria: Apparently had negative workup by urology.  11. ABIs (6/16) were normal 12. 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  13. Gout 14. Low back pain.    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  Has son and  daughter.     FH: No premature CAD.    Review of systems complete and found to be negative unless listed in HPI.   Current Outpatient Medications  Medication Sig Dispense Refill   albuterol (VENTOLIN HFA) 108 (90 Base) MCG/ACT inhaler Inhale 1-2 puffs into the lungs every 4 (four) hours as needed for shortness of breath or wheezing.     amiodarone (PACERONE) 200 MG tablet Take 1 tablet (200 mg total) by mouth 2 (two) times daily. (Patient taking differently: Take 200 mg by mouth 2 (two) times daily. Patient taking once daily) 60 tablet 1   ARIPiprazole (ABILIFY) 10 MG tablet Take 1 tablet (10 mg total) by mouth daily. 90 tablet 0   atorvastatin (LIPITOR) 40 MG tablet Take 1 tablet (40 mg total) by mouth daily. 100 tablet 1   blood glucose meter kit and supplies KIT Use to check blood sugar. DX E11.9 1 each 0   clonazePAM (KLONOPIN) 1 MG tablet Take 1 tablet (1 mg   total) by mouth 2 (two) times daily as needed for anxiety. 60 tablet 2   dapagliflozin propanediol (FARXIGA) 10 MG TABS tablet Take 1 tablet (10 mg total) by mouth daily. 90 tablet 1   losartan (COZAAR) 25 MG tablet Take 0.5 tablets (12.5 mg total) by mouth at bedtime. 15 tablet 6   pantoprazole (PROTONIX) 40 MG tablet TAKE 1 TABLET BY MOUTH EVERY DAY 90 tablet 1   potassium chloride SA (KLOR-CON) 20 MEQ tablet Take 2 tablets (40 mEq total) by mouth daily. 60 tablet 11   PRESCRIPTION MEDICATION Inhale into the lungs See admin instructions. Bipap, pressure 16/12 with 2L of O2 - use whenever sleeping     sertraline (ZOLOFT) 100 MG tablet Take 1 tablet (100 mg total) by mouth daily. 90 tablet 0   spironolactone (ALDACTONE) 25 MG tablet Take 1 tablet (25 mg total) by mouth daily. 90 tablet 0   thiamine 100 MG tablet Take 1 tablet (100 mg total) by mouth daily. 90 tablet 1   torsemide (DEMADEX) 20 MG tablet Take 3 tablets (60 mg total) by mouth daily. 90 tablet 5   XARELTO 20 MG TABS tablet TAKE 1 TABLET BY MOUTH EVERY DAY 90 tablet 1    gabapentin (NEURONTIN) 100 MG capsule Take 1 capsule (100 mg total) by mouth 3 (three) times daily. (Patient not taking: Reported on 07/29/2021) 90 capsule 0   metolazone (ZAROXOLYN) 2.5 MG tablet Take 1 tablet (2.5 mg total) by mouth daily for 2 days. (Patient not taking: Reported on 07/29/2021) 2 tablet 0   vitamin B-12 (CYANOCOBALAMIN) 1000 MCG tablet Take 1,000 mcg by mouth daily. (Patient not taking: No sig reported)     No current facility-administered medications for this encounter.   Allergies  Allergen Reactions   Ace Inhibitors Anaphylaxis and Swelling    Angioedema   Bidil [Isosorb Dinitrate-Hydralazine] Other (See Comments)    headache   Digoxin And Related     Unspecified "side effects"   Buspirone Other (See Comments)    dizziness   Social History   Socioeconomic History   Marital status: Married    Spouse name: Not on file   Number of children: 3   Years of education: Not on file   Highest education level: Not on file  Occupational History   Occupation: DISABLED  Tobacco Use   Smoking status: Former    Packs/day: 0.50    Years: 30.00    Pack years: 15.00    Types: Cigarettes    Quit date: 05/15/2020    Years since quitting: 1.2   Smokeless tobacco: Never   Tobacco comments:    vaping - nicotine-free products  Vaping Use   Vaping Use: Never used  Substance and Sexual Activity   Alcohol use: Not Currently    Alcohol/week: 0.0 standard drinks   Drug use: No   Sexual activity: Not Currently  Other Topics Concern   Not on file  Social History Narrative   He smokes about a pack per day and he has been smoking since he was 55 years of age.  He drinks alcohol occasionally, but he denies any illicit drug abuse.  He is presently on disability.    Lives with wife in a 2 story home.  Has 2 children.   Previously worked in security, last worked in 1998.   Highest level of education:  11th grade           Social Determinants of Health   Financial Resource  Strain:   Low Risk    Difficulty of Paying Living Expenses: Not hard at all  Food Insecurity: No Food Insecurity   Worried About Running Out of Food in the Last Year: Never true   Ran Out of Food in the Last Year: Never true  Transportation Needs: No Transportation Needs   Lack of Transportation (Medical): No   Lack of Transportation (Non-Medical): No  Physical Activity: Sufficiently Active   Days of Exercise per Week: 5 days   Minutes of Exercise per Session: 30 min  Stress: No Stress Concern Present   Feeling of Stress : Not at all  Social Connections: Moderately Isolated   Frequency of Communication with Friends and Family: More than three times a week   Frequency of Social Gatherings with Friends and Family: Never   Attends Religious Services: Never   Active Member of Clubs or Organizations: No   Attends Club or Organization Meetings: Never   Marital Status: Married  Intimate Partner Violence: Not on file   Family History  Problem Relation Age of Onset   Cancer Mother        brain tumor   Hypertension Mother    Diabetes Father        Deceased, 57   Heart disease Maternal Grandmother    Hypertension Other        Family History   Stroke Other        Family History   Diabetes Other        Family History   Diabetes Daughter    BP 114/76   Pulse 63   Ht 5' 9" (1.753 m)   Wt (!) 174.7 kg (385 lb 3.2 oz)   SpO2 97%   BMI 56.88 kg/m   Wt Readings from Last 3 Encounters:  07/29/21 (!) 174.7 kg (385 lb 3.2 oz)  07/19/21 (!) 175.5 kg (387 lb)  07/08/21 (!) 174.2 kg (384 lb)   PHYSICAL EXAM: General:  NAD. No resp difficulty HEENT: Normal Neck: Supple. No JVD. Carotids 2+ bilat; no bruits. No lymphadenopathy or thryomegaly appreciated. Cor: PMI nondisplaced. Regular rate & rhythm. No rubs, gallops or murmurs. Lungs: Clear Abdomen: Obese, nontender, nondistended. No hepatosplenomegaly. No bruits or masses. Good bowel sounds. Extremities: No cyanosis, clubbing, rash, 1+  LE edema; scabbed skin tear on right shin healing, no erythema or drainage present. Neuro: Alert & oriented x 3, cranial nerves grossly intact. Moves all 4 extremities w/o difficulty. Affect pleasant.  ASSESSMENT & PLAN: 1. Atrial fibrillation: With RVR.  Patient has history of PAF. He was admitted with AF/RVR, this may have triggered CHF exacerbation, ? tachy-mediated.  TEE-guided DCCV 6/16, he has remained in SR. - Continue amiodarone 200 mg daily (recently decreased due to hand tremors). - Continue Xarelto. No bleeding issues.  - Off digoxin and Toprol XL with bradycardia. 2.  Chronic systolic CHF: Nonischemic cardiomyopathy. ?If ETOH has played a role (now drinks rarely). Echo (3/18) with EF 25-30%. He was seen by EP and decided against ICD given marked obesity.  QRS not wide enough for CRT. Echo 7/19 and in 2/21 with EF 20-25%. Echo 6/22 with EF < 20%, normal RV.  Admitted with AF/RVR and CHF exacerbation, diuresed well. Now, NYHA II-early III. He appears mildly fluid overloaded on exam. - Increase daily torsemide to 40 mg. BMET today. - Continue spironolactone 25 mg daily. - Continue Farxiga 10 mg daily. - Continue losartan 12.5 mg at night. - Off digoxin and Toprol XL with bradycardia.  - No Entresto with   h/o angioedema.  - Headaches with Bidil, cannot take.  - Need to keep in NSR as above. 3. HTN: Stable. Continue current regimen. 4. PNA: Diffuse bilateral infiltrates on CXR. Oxygen saturation 100%.  - Clear lung sounds on exam. 5. VT arrest: 6/13, progression from AF/RVR.  Has been turned down for ICD due to size.   - EP has seen. Continue amio for AF and VT.  6. OSA: Continue nightly BiPAP. 7. PE: 04/14/2018, diagnosed by V/Q scan. Has been on Xarelto. 8. Anxiety/Panic attacks/depression: Followed by psychiatry. 9. Hyperlipidemia: Restart atorvastatin (he has been out of this for several weeks).  - Check lipids in 6-8 weeks. 10. Cellulitis: Improving after day days of  cephalexin. Instructed him to keep an eye on area and if it becomes red/warm/drainage, he needs to notify his PCP.He bumped his right leg a few days ago.  11. ED: I will refill his sildenafil as requested. He is not on, nor does he use  nitrates.  Follow up 2-3 months with Dr. Aundra Dubin.  [place] FNP 07/29/2021

## 2021-07-28 ENCOUNTER — Other Ambulatory Visit: Payer: Self-pay | Admitting: Internal Medicine

## 2021-07-28 DIAGNOSIS — N522 Drug-induced erectile dysfunction: Secondary | ICD-10-CM

## 2021-07-29 ENCOUNTER — Ambulatory Visit (HOSPITAL_COMMUNITY)
Admission: RE | Admit: 2021-07-29 | Discharge: 2021-07-29 | Disposition: A | Discharge status: Home / Self Care | Payer: Medicare HMO | Source: Ambulatory Visit | Attending: Family Medicine | Admitting: Family Medicine

## 2021-07-29 ENCOUNTER — Other Ambulatory Visit: Payer: Self-pay

## 2021-07-29 ENCOUNTER — Encounter (HOSPITAL_COMMUNITY): Payer: Self-pay

## 2021-07-29 VITALS — BP 114/76 | HR 63 | Ht 69.0 in | Wt 385.2 lb

## 2021-07-29 DIAGNOSIS — J189 Pneumonia, unspecified organism: Secondary | ICD-10-CM | POA: Diagnosis not present

## 2021-07-29 DIAGNOSIS — I110 Hypertensive heart disease with heart failure: Secondary | ICD-10-CM | POA: Diagnosis not present

## 2021-07-29 DIAGNOSIS — F17210 Nicotine dependence, cigarettes, uncomplicated: Secondary | ICD-10-CM | POA: Diagnosis not present

## 2021-07-29 DIAGNOSIS — Z6841 Body Mass Index (BMI) 40.0 and over, adult: Secondary | ICD-10-CM | POA: Diagnosis not present

## 2021-07-29 DIAGNOSIS — I5022 Chronic systolic (congestive) heart failure: Secondary | ICD-10-CM | POA: Insufficient documentation

## 2021-07-29 DIAGNOSIS — N529 Male erectile dysfunction, unspecified: Secondary | ICD-10-CM

## 2021-07-29 DIAGNOSIS — F419 Anxiety disorder, unspecified: Secondary | ICD-10-CM | POA: Diagnosis not present

## 2021-07-29 DIAGNOSIS — I480 Paroxysmal atrial fibrillation: Secondary | ICD-10-CM | POA: Diagnosis not present

## 2021-07-29 DIAGNOSIS — L03115 Cellulitis of right lower limb: Secondary | ICD-10-CM | POA: Diagnosis not present

## 2021-07-29 DIAGNOSIS — Z7901 Long term (current) use of anticoagulants: Secondary | ICD-10-CM | POA: Diagnosis not present

## 2021-07-29 DIAGNOSIS — Z86711 Personal history of pulmonary embolism: Secondary | ICD-10-CM

## 2021-07-29 DIAGNOSIS — Z8679 Personal history of other diseases of the circulatory system: Secondary | ICD-10-CM

## 2021-07-29 DIAGNOSIS — L0390 Cellulitis, unspecified: Secondary | ICD-10-CM | POA: Diagnosis not present

## 2021-07-29 DIAGNOSIS — I472 Ventricular tachycardia: Secondary | ICD-10-CM | POA: Diagnosis not present

## 2021-07-29 DIAGNOSIS — I10 Essential (primary) hypertension: Secondary | ICD-10-CM | POA: Diagnosis not present

## 2021-07-29 DIAGNOSIS — G4733 Obstructive sleep apnea (adult) (pediatric): Secondary | ICD-10-CM | POA: Insufficient documentation

## 2021-07-29 DIAGNOSIS — Z7984 Long term (current) use of oral hypoglycemic drugs: Secondary | ICD-10-CM | POA: Diagnosis not present

## 2021-07-29 DIAGNOSIS — I428 Other cardiomyopathies: Secondary | ICD-10-CM | POA: Diagnosis not present

## 2021-07-29 DIAGNOSIS — Z888 Allergy status to other drugs, medicaments and biological substances status: Secondary | ICD-10-CM | POA: Diagnosis not present

## 2021-07-29 DIAGNOSIS — Z8701 Personal history of pneumonia (recurrent): Secondary | ICD-10-CM

## 2021-07-29 DIAGNOSIS — Z79899 Other long term (current) drug therapy: Secondary | ICD-10-CM | POA: Diagnosis not present

## 2021-07-29 DIAGNOSIS — E785 Hyperlipidemia, unspecified: Secondary | ICD-10-CM | POA: Insufficient documentation

## 2021-07-29 DIAGNOSIS — R69 Illness, unspecified: Secondary | ICD-10-CM | POA: Diagnosis not present

## 2021-07-29 DIAGNOSIS — I509 Heart failure, unspecified: Secondary | ICD-10-CM

## 2021-07-29 LAB — BASIC METABOLIC PANEL
Anion gap: 5 (ref 5–15)
BUN: 22 mg/dL — ABNORMAL HIGH (ref 6–20)
CO2: 24 mmol/L (ref 22–32)
Calcium: 8.7 mg/dL — ABNORMAL LOW (ref 8.9–10.3)
Chloride: 110 mmol/L (ref 98–111)
Creatinine, Ser: 1.31 mg/dL — ABNORMAL HIGH (ref 0.61–1.24)
GFR, Estimated: >60 mL/min (ref >60)
Glucose, Bld: 106 mg/dL — ABNORMAL HIGH (ref 70–99)
Potassium: 3.8 mmol/L (ref 3.5–5.1)
Sodium: 139 mmol/L (ref 135–145)

## 2021-07-29 MED ORDER — SILDENAFIL CITRATE 20 MG PO TABS
20.0000 mg | ORAL_TABLET | ORAL | 0 refills | Status: DC | PRN
Start: 2021-07-29 — End: 2021-09-20

## 2021-07-29 MED ORDER — TORSEMIDE 20 MG PO TABS
40.0000 mg | ORAL_TABLET | Freq: Every day | ORAL | 3 refills | Status: DC
Start: 2021-07-29 — End: 2022-01-31

## 2021-07-29 NOTE — Patient Instructions (Signed)
Increase Torsemide to 40 mg daily  Sildenafil 20 mg as needed ( may take up to 4 tablets as needed  Labs done today, your results will be available in MyChart, we will contact you for abnormal readings.   Your physician recommends that you schedule a follow-up appointment in: 3 months  If you have any questions or concerns before your next appointment please send [place] a message through [place] or call our office at [phone].    TO LEAVE A MESSAGE FOR THE NURSE SELECT OPTION 2, PLEASE LEAVE A MESSAGE INCLUDING: YOUR NAME DATE OF BIRTH CALL BACK NUMBER REASON FOR CALL**this is important as we prioritize the call backs  YOU WILL RECEIVE A CALL BACK THE SAME DAY AS LONG AS YOU CALL BEFORE 4:00 PM  At the [facility], you and your health needs are our priority. As part of our continuing mission to provide you with exceptional heart care, we have created designated Provider Care Teams. These Care Teams include your primary Cardiologist (physician) and Advanced Practice Providers (APPs- Physician Assistants and Nurse Practitioners) who all work together to provide you with the care you need, when you need it.   You may see any of the following providers on your designated Care Team at your next follow up: Dr Glori Bickers Dr [name] Dr Patrice Paradise, NP Lyda Jester, [place] Ginnie Smart Audry Riles, PharmD   Please be sure to bring in all your medications bottles to every appointment.

## 2021-07-30 ENCOUNTER — Other Ambulatory Visit (HOSPITAL_COMMUNITY): Payer: Self-pay | Admitting: Cardiology

## 2021-07-30 DIAGNOSIS — I509 Heart failure, unspecified: Secondary | ICD-10-CM

## 2021-07-30 NOTE — Progress Notes (Signed)
Orders for repeat labs placed

## 2021-08-07 DIAGNOSIS — G4733 Obstructive sleep apnea (adult) (pediatric): Secondary | ICD-10-CM | POA: Diagnosis not present

## 2021-08-12 ENCOUNTER — Telehealth: Payer: Self-pay | Admitting: Pharmacist
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
## 2021-08-12 NOTE — Progress Notes (Signed)
Chronic Care Management Pharmacy Assistant   Name: [name]  MRN: [id number] DOB: [birth year]   Reason for Encounter: Disease State   Conditions to be addressed/monitored: HLD   Recent office visits:  None ID  Recent consult visits:  None ID  Hospital visits:  None since last coordination call  Medications: Outpatient Encounter Medications as of 08/12/2021  Medication Sig   albuterol (VENTOLIN HFA) 108 (90 Base) MCG/ACT inhaler Inhale 1-2 puffs into the lungs every 4 (four) hours as needed for shortness of breath or wheezing.   amiodarone (PACERONE) 200 MG tablet Take 1 tablet (200 mg total) by mouth 2 (two) times daily. (Patient taking differently: Take 200 mg by mouth 2 (two) times daily. Patient taking once daily)   ARIPiprazole (ABILIFY) 10 MG tablet Take 1 tablet (10 mg total) by mouth daily.   atorvastatin (LIPITOR) 40 MG tablet Take 1 tablet (40 mg total) by mouth daily.   blood glucose meter kit and supplies KIT Use to check blood sugar. DX E11.9   clonazePAM (KLONOPIN) 1 MG tablet Take 1 tablet (1 mg total) by mouth 2 (two) times daily as needed for anxiety.   dapagliflozin propanediol (FARXIGA) 10 MG TABS tablet Take 1 tablet (10 mg total) by mouth daily.   gabapentin (NEURONTIN) 100 MG capsule Take 1 capsule (100 mg total) by mouth 3 (three) times daily. (Patient not taking: Reported on 07/29/2021)   losartan (COZAAR) 25 MG tablet Take 0.5 tablets (12.5 mg total) by mouth at bedtime.   metolazone (ZAROXOLYN) 2.5 MG tablet Take 1 tablet (2.5 mg total) by mouth daily for 2 days. (Patient not taking: Reported on 07/29/2021)   pantoprazole (PROTONIX) 40 MG tablet TAKE 1 TABLET BY MOUTH EVERY DAY   potassium chloride SA (KLOR-CON) 20 MEQ tablet Take 2 tablets (40 mEq total) by mouth daily.   PRESCRIPTION MEDICATION Inhale into the lungs See admin instructions. Bipap, pressure 16/12 with 2L of O2 - use whenever sleeping   sertraline (ZOLOFT) 100 MG tablet Take 1  tablet (100 mg total) by mouth daily.   sildenafil (REVATIO) 20 MG tablet Take 1 tablet (20 mg total) by mouth as needed. May take up to  4 tabs as needed   spironolactone (ALDACTONE) 25 MG tablet Take 1 tablet (25 mg total) by mouth daily.   thiamine 100 MG tablet Take 1 tablet (100 mg total) by mouth daily.   torsemide (DEMADEX) 20 MG tablet Take 2 tablets (40 mg total) by mouth daily.   vitamin B-12 (CYANOCOBALAMIN) 1000 MCG tablet Take 1,000 mcg by mouth daily. (Patient not taking: No sig reported)   XARELTO 20 MG TABS tablet TAKE 1 TABLET BY MOUTH EVERY DAY   [DISCONTINUED] pravastatin (PRAVACHOL) 40 MG tablet Take 40 mg by mouth daily.   No facility-administered encounter medications on file as of 08/12/2021.   08/12/2021 Name: [name] MRN: [id number] DOB: 10-10-1966 [name] is a 55 y.o. year old male who is a primary care patient of [name], MD.  Comprehensive medication review performed; Spoke to patient regarding cholesterol  Lipid Panel    Component Value Date/Time   CHOL 157 02/22/2021 1211   TRIG 221 (H) 05/14/2021 0226   HDL 37 (L) 02/22/2021 1211   LDLCALC 101 (H) 02/22/2021 1211   LDLDIRECT 114.0 03/21/2020 1455   Contacted patient on 08/12/21 to discuss hyperlipidemia  10-year ASCVD risk score: The 10-year ASCVD risk score (Arnett DK, et al., 2019) is: 14.6%  Values used to calculate the score:     Age: 55 years     Sex: Male     Is Non-Hispanic African American: Yes     Diabetic: Yes     Tobacco smoker: No     Systolic Blood Pressure: 923 mmHg     Is BP treated: Yes     HDL Cholesterol: 37 mg/dL     Total Cholesterol: 157 mg/dL  Current antihyperlipidemic regimen:  Atorvastatin 40 mg 1 tab daily (Patient states that he has been out of atorvastatin for almost a month. Called [facility] pharmacist ran it through and he can pick up this evening) Previous antihyperlipidemic medications tried: none noted  ASCVD risk enhancing conditions: DM  What  recent interventions/DTPs have been made by any provider to improve Cholesterol control since last CPP Visit: none noted  Any recent hospitalizations or ED visits since last visit with CPP? No  What diet changes have been made to improve Cholesterol?  Patient stated that he is eating more fruits and vegetables  What exercise is being done to improve Cholesterol?  Patient stated that he is walking on treadmill a few times a week  Adherence Review: Does the patient have >5 day gap between last estimated fill dates? No  Atorvastatin 40 mg 1 tab daily   Care Gaps: Annual wellness visit in last year? Yes Most Recent BP reading:110/70  If Diabetic: Most recent A1C reading:6.2 05/10/21 Last eye exam / retinopathy screening:last ordered 11/19/20 Last diabetic foot exam:11/19/20  CCM appointment on 10/09/21  Star Rating Drugs Medication Name/Dose: Last fill date Days supply  Atorvastatin 40 mg   07/05/21  [place] Pharmacist Assistant [phone]   Time spent:33

## 2021-08-13 ENCOUNTER — Other Ambulatory Visit: Payer: Self-pay

## 2021-08-13 ENCOUNTER — Encounter: Payer: Self-pay | Admitting: Psychiatry

## 2021-08-13 ENCOUNTER — Telehealth (INDEPENDENT_AMBULATORY_CARE_PROVIDER_SITE_OTHER): Payer: Medicare HMO | Admitting: Psychiatry

## 2021-08-13 DIAGNOSIS — F3132 Bipolar disorder, current episode depressed, moderate: Secondary | ICD-10-CM

## 2021-08-13 DIAGNOSIS — R69 Illness, unspecified: Secondary | ICD-10-CM | POA: Diagnosis not present

## 2021-08-13 DIAGNOSIS — F4310 Post-traumatic stress disorder, unspecified: Secondary | ICD-10-CM | POA: Diagnosis not present

## 2021-08-13 DIAGNOSIS — F1020 Alcohol dependence, uncomplicated: Secondary | ICD-10-CM | POA: Diagnosis not present

## 2021-08-13 MED ORDER — SERTRALINE HCL 100 MG PO TABS: 150.0000 mg | ORAL_TABLET | Freq: Every day | ORAL | 0 refills | Status: DC

## 2021-08-13 MED ORDER — ARIPIPRAZOLE 10 MG PO TABS: 10.0000 mg | ORAL_TABLET | Freq: Every day | ORAL | 0 refills | Status: DC

## 2021-08-13 NOTE — Patient Instructions (Signed)
Increase sertraline 150 mg daily 2. Continue Abilify 10 mg daily 3. Obtain labs (LFT) 4. Continue clonazepam 1 mg twice a day as needed for anxiety 5. Consider starting naltrexone 25 mg at night after obtaining labs 6. Next appointment- 10/25 at 10:30 for 30 mins, video 7 . Referral for thearpy

## 2021-08-14 ENCOUNTER — Ambulatory Visit (HOSPITAL_COMMUNITY)
Admission: RE | Admit: 2021-08-14 | Discharge: 2021-08-14 | Disposition: A | Discharge status: Home / Self Care | Payer: Medicare HMO | Source: Ambulatory Visit | Attending: Internal Medicine | Admitting: Internal Medicine

## 2021-08-14 DIAGNOSIS — I509 Heart failure, unspecified: Secondary | ICD-10-CM | POA: Diagnosis not present

## 2021-08-14 LAB — BASIC METABOLIC PANEL
Anion gap: 7 (ref 5–15)
BUN: 15 mg/dL (ref 6–20)
CO2: 22 mmol/L (ref 22–32)
Calcium: 9 mg/dL (ref 8.9–10.3)
Chloride: 109 mmol/L (ref 98–111)
Creatinine, Ser: 1.26 mg/dL — ABNORMAL HIGH (ref 0.61–1.24)
GFR, Estimated: >60 mL/min (ref >60)
Glucose, Bld: 82 mg/dL (ref 70–99)
Potassium: 3.6 mmol/L (ref 3.5–5.1)
Sodium: 138 mmol/L (ref 135–145)

## 2021-08-18 DIAGNOSIS — G4733 Obstructive sleep apnea (adult) (pediatric): Secondary | ICD-10-CM | POA: Diagnosis not present

## 2021-08-18 DIAGNOSIS — I2699 Other pulmonary embolism without acute cor pulmonale: Secondary | ICD-10-CM | POA: Diagnosis not present

## 2021-08-18 DIAGNOSIS — I5042 Chronic combined systolic (congestive) and diastolic (congestive) heart failure: Secondary | ICD-10-CM | POA: Diagnosis not present

## 2021-08-19 ENCOUNTER — Telehealth: Payer: Self-pay | Admitting: Physical Medicine and Rehabilitation

## 2021-08-19 NOTE — Telephone Encounter (Signed)
Patient called. He would like to [facility] his appointment

## 2021-08-20 ENCOUNTER — Encounter: Payer: Medicare HMO | Admitting: Physical Medicine and Rehabilitation

## 2021-09-04 ENCOUNTER — Other Ambulatory Visit (HOSPITAL_COMMUNITY): Payer: Self-pay | Admitting: Family Medicine

## 2021-09-05 ENCOUNTER — Other Ambulatory Visit (HOSPITAL_COMMUNITY): Payer: Self-pay | Admitting: Cardiology

## 2021-09-06 ENCOUNTER — Telehealth: Payer: Self-pay

## 2021-09-06 NOTE — Progress Notes (Signed)
Chronic Care Management Pharmacy Assistant   Name: [name]  MRN: [id number] DOB: [birth year]    Reason for Encounter: Disease State   Conditions to be addressed/monitored: HTN   Recent office visits:  None ID  Recent consult visits:  None ID  Hospital visits:  None in previous 6 months  Medications: Outpatient Encounter Medications as of 09/06/2021  Medication Sig   albuterol (VENTOLIN HFA) 108 (90 Base) MCG/ACT inhaler Inhale 1-2 puffs into the lungs every 4 (four) hours as needed for shortness of breath or wheezing.   amiodarone (PACERONE) 200 MG tablet Take 1 tablet (200 mg total) by mouth daily. Patient taking once daily   ARIPiprazole (ABILIFY) 10 MG tablet Take 1 tablet (10 mg total) by mouth daily.   atorvastatin (LIPITOR) 40 MG tablet Take 1 tablet (40 mg total) by mouth daily.   blood glucose meter kit and supplies KIT Use to check blood sugar. DX E11.9   clonazePAM (KLONOPIN) 1 MG tablet Take 1 tablet (1 mg total) by mouth 2 (two) times daily as needed for anxiety.   dapagliflozin propanediol (FARXIGA) 10 MG TABS tablet Take 1 tablet (10 mg total) by mouth daily.   losartan (COZAAR) 25 MG tablet TAKE 0.5 TABLETS BY MOUTH AT BEDTIME.   metolazone (ZAROXOLYN) 2.5 MG tablet Take 1 tablet (2.5 mg total) by mouth daily for 2 days. (Patient not taking: Reported on 07/29/2021)   pantoprazole (PROTONIX) 40 MG tablet TAKE 1 TABLET BY MOUTH EVERY DAY   potassium chloride SA (KLOR-CON) 20 MEQ tablet Take 2 tablets (40 mEq total) by mouth daily.   PRESCRIPTION MEDICATION Inhale into the lungs See admin instructions. Bipap, pressure 16/12 with 2L of O2 - use whenever sleeping   sertraline (ZOLOFT) 100 MG tablet Take 1.5 tablets (150 mg total) by mouth daily.   sildenafil (REVATIO) 20 MG tablet Take 1 tablet (20 mg total) by mouth as needed. May take up to  4 tabs as needed   spironolactone (ALDACTONE) 25 MG tablet Take 1 tablet (25 mg total) by mouth daily.   thiamine  100 MG tablet Take 1 tablet (100 mg total) by mouth daily.   torsemide (DEMADEX) 20 MG tablet Take 2 tablets (40 mg total) by mouth daily.   vitamin B-12 (CYANOCOBALAMIN) 1000 MCG tablet Take 1,000 mcg by mouth daily. (Patient not taking: No sig reported)   XARELTO 20 MG TABS tablet TAKE 1 TABLET BY MOUTH EVERY DAY   [DISCONTINUED] pravastatin (PRAVACHOL) 40 MG tablet Take 40 mg by mouth daily.   No facility-administered encounter medications on file as of 09/06/2021.   Reviewed chart prior to disease state call. Spoke with patient regarding BP  Recent Office Vitals: BP Readings from Last 3 Encounters:  07/29/21 114/76  07/19/21 133/76  07/08/21 120/78   Pulse Readings from Last 3 Encounters:  07/29/21 63  07/19/21 68  07/08/21 73    Wt Readings from Last 3 Encounters:  07/29/21 (!) 385 lb 3.2 oz (174.7 kg)  07/19/21 (!) 387 lb (175.5 kg)  07/08/21 (!) 384 lb (174.2 kg)     Kidney Function Lab Results  Component Value Date/Time   CREATININE 1.26 (H) 08/14/2021 02:47 PM   CREATININE 1.31 (H) 07/29/2021 12:54 PM   CREATININE 1.18 08/24/2017 10:59 AM   CREATININE 1.06 01/15/2017 12:11 PM   GFR 59.20 (L) 07/08/2021 11:58 AM   GFRNONAA >60 08/14/2021 02:47 PM   GFRAA >60 05/15/2020 02:52 PM    BMP Latest Ref Rng &  Units 08/14/2021 07/29/2021 07/19/2021  Glucose 70 - 99 mg/dL 82 106(H) 86  BUN 6 - 20 mg/dL 15 22(H) 14  Creatinine 0.61 - 1.24 mg/dL 1.26(H) 1.31(H) 1.23  BUN/Creat Ratio 6 - 22 (calc) - - -  Sodium 135 - 145 mmol/L 138 139 135  Potassium 3.5 - 5.1 mmol/L 3.6 3.8 3.8  Chloride 98 - 111 mmol/L 109 110 101  CO2 22 - 32 mmol/L _0 Calcium 8.9 - 10.3 mg/dL 9.0 8.7(L) 8.6(L)    Current antihypertensive regimen:  Losartan 25 mg 1 tab bedtime  How often are you checking your Blood Pressure?  Patient states that he does not check but once or twice a month  Current home BP readings: Patient states he does not log readings when he does take his blood  pressure  What recent interventions/DTPs have been made by any provider to improve Blood Pressure control since last CPP Visit: None noted  Any recent hospitalizations or ED visits since last visit with CPP? No  What diet changes have been made to improve Blood Pressure Control?  Patient states that he watches what he eats baked foods, vegetables, cut out fried foods  What exercise is being done to improve your Blood Pressure Control?  Patient is continuing to walk  Adherence Review: Is the patient currently on ACE/ARB medication? Yes Does the patient have >5 day gap between last estimated fill dates? No  [name] Clinical Pharmacist Assistant [phone]

## 2021-09-10 ENCOUNTER — Other Ambulatory Visit: Payer: Self-pay

## 2021-09-10 ENCOUNTER — Ambulatory Visit (INDEPENDENT_AMBULATORY_CARE_PROVIDER_SITE_OTHER): Payer: Medicare HMO | Admitting: Licensed Clinical Social Worker

## 2021-09-10 DIAGNOSIS — R69 Illness, unspecified: Secondary | ICD-10-CM | POA: Diagnosis not present

## 2021-09-10 DIAGNOSIS — F3132 Bipolar disorder, current episode depressed, moderate: Secondary | ICD-10-CM

## 2021-09-10 DIAGNOSIS — F4310 Post-traumatic stress disorder, unspecified: Secondary | ICD-10-CM | POA: Diagnosis not present

## 2021-09-10 NOTE — Progress Notes (Signed)
Virtual Visit via Video Note  I connected with [name] on 09/10/21 at  2:00 PM EDT by a video enabled telemedicine application and verified that I am speaking with the correct person using two identifiers.  Location: Patient: home Provider: remote office [place], [place])   I discussed the limitations of evaluation and management by telemedicine and the availability of in person appointments. The patient expressed understanding and agreed to proceed.  I discussed the assessment and treatment plan with the patient. The patient was provided an opportunity to ask questions and all were answered. The patient agreed with the plan and demonstrated an understanding of the instructions.   The patient was advised to call back or seek an in-person evaluation if the symptoms worsen or if the condition fails to improve as anticipated.  I provided 45 minutes of non-face-to-face time during this encounter.   [place], LCSW  THERAPIST PROGRESS NOTE  Session Time: 2-245p  Participation Level: Active  Behavioral Response: Neat and Well GroomedAlertDepressed  Type of Therapy: Individual Therapy  Treatment Goals addressed:  Goal: LTG: Reduce frequency, intensity, and duration of depression symptoms as evidenced by: pt self report Outcome: Not Applicable Note: developed  Goal: STG: [name] WILL PARTICIPATE IN AT LEAST 80% OF SCHEDULED INDIVIDUAL PSYCHOTHERAPY SESSIONS Outcome: Not Applicable Note: developed  Interventions:  Intervention: WORK WITH [name] TO IDENTIFY THE MAJOR COMPONENTS OF A RECENT EPISODE OF DEPRESSION: PHYSICAL SYMPTOMS, MAJOR THOUGHTS AND IMAGES, AND MAJOR BEHAVIORS THEY EXPERIENCED  Intervention: REVIEW PLEASE SKILLS (TREAT PHYSICAL ILLNESS, BALANCE EATING, AVOID MOOD-ALTERING SUBSTANCES, BALANCE SLEEP AND GET EXERCISE) WITH [name]  Intervention: Discuss self-management skills   Summary: [name] is a 55 y.o. male who presents with symptoms  consistent with bipolar disorder. Pt reports that mood has been more depressed recently. Allowed pt to explore and express thoughts and feelings about recent external stressors and life events. Pt reports that wife has recently gone back to work and pt is feeling unsettled in empty quiet house. "I need to find something to do to occupy my time".   Suicidal/Homicidal: No  Therapist Response: Reviewed/updated CCA and developed treatment plan  Plan: Return again in 4 weeks. 4 Diagnosis: Axis I: Bipolar 1 disorder, depressed    Axis II: No diagnosis    [place], LCSW 09/10/2021

## 2021-09-11 ENCOUNTER — Telehealth: Payer: Self-pay | Admitting: Pharmacist

## 2021-09-11 NOTE — Progress Notes (Signed)
    Chronic Care Management Pharmacy Assistant   Name: [name]  MRN: [id number] DOB: 05/12/1966  Spoke with patient today and rescheduled his appointment with clinical pharmacist Charlene Brooke on 10/09/21 to 10/17/21 @ 2:45 pm.  [place] Pharmacist Assistant [phone]

## 2021-09-11 NOTE — Plan of Care (Signed)
  Problem: Decrease depressive symptoms and improve levels of effective functioning Goal: LTG: Reduce frequency, intensity, and duration of depression symptoms as evidenced by: pt self report Outcome: Not Applicable Note: developed Goal: STG: [name] WILL PARTICIPATE IN AT LEAST 80% OF SCHEDULED INDIVIDUAL PSYCHOTHERAPY SESSIONS Outcome: Not Applicable Note: developed Intervention: WORK WITH [name] TO IDENTIFY THE MAJOR COMPONENTS OF A RECENT EPISODE OF DEPRESSION: PHYSICAL SYMPTOMS, MAJOR THOUGHTS AND IMAGES, AND MAJOR BEHAVIORS THEY EXPERIENCED Intervention: REVIEW PLEASE SKILLS (TREAT PHYSICAL ILLNESS, BALANCE EATING, AVOID MOOD-ALTERING SUBSTANCES, BALANCE SLEEP AND GET EXERCISE) WITH [name] Intervention: Discuss self-management skills

## 2021-09-17 DIAGNOSIS — G4733 Obstructive sleep apnea (adult) (pediatric): Secondary | ICD-10-CM | POA: Diagnosis not present

## 2021-09-17 DIAGNOSIS — I2699 Other pulmonary embolism without acute cor pulmonale: Secondary | ICD-10-CM | POA: Diagnosis not present

## 2021-09-17 DIAGNOSIS — I5042 Chronic combined systolic (congestive) and diastolic (congestive) heart failure: Secondary | ICD-10-CM | POA: Diagnosis not present
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
## 2021-09-20 ENCOUNTER — Other Ambulatory Visit (HOSPITAL_COMMUNITY): Payer: Self-pay | Admitting: Family Medicine

## 2021-09-20 ENCOUNTER — Telehealth: Payer: Self-pay | Admitting: Internal Medicine

## 2021-09-20 NOTE — Telephone Encounter (Signed)
1.Medication Requested: spironolactone (ALDACTONE) 25 MG tablet clonazePAM (KLONOPIN) 1 MG tablet sertraline (ZOLOFT) 100 MG tablet dapagliflozin propanediol (FARXIGA) 10 MG TABS tablet amiodarone (PACERONE) 200 MG tablet sildenafil (REVATIO) 20 MG tablet   2. Pharmacy (Name, [place]): [id number] - [place], [place] [phone] [place] [address]  Phone:  [phone] Fax:  [phone]     3. On Med List: yes  4. Last Visit with PCP: 08.08.22  5. Next visit date with PCP: n/a   Agent: Please be advised that RX refills may take up to 3 business days. We ask that you follow-up with your pharmacy.

## 2021-09-23 ENCOUNTER — Other Ambulatory Visit (HOSPITAL_COMMUNITY): Payer: Self-pay

## 2021-09-23 ENCOUNTER — Other Ambulatory Visit: Payer: Self-pay | Admitting: Internal Medicine

## 2021-09-23 DIAGNOSIS — I480 Paroxysmal atrial fibrillation: Secondary | ICD-10-CM

## 2021-09-23 DIAGNOSIS — I5042 Chronic combined systolic (congestive) and diastolic (congestive) heart failure: Secondary | ICD-10-CM

## 2021-09-23 DIAGNOSIS — I2699 Other pulmonary embolism without acute cor pulmonale: Secondary | ICD-10-CM

## 2021-09-23 DIAGNOSIS — I428 Other cardiomyopathies: Secondary | ICD-10-CM

## 2021-09-23 DIAGNOSIS — I10 Essential (primary) hypertension: Secondary | ICD-10-CM

## 2021-09-23 DIAGNOSIS — E118 Type 2 diabetes mellitus with unspecified complications: Secondary | ICD-10-CM

## 2021-09-23 MED ORDER — RIVAROXABAN 20 MG PO TABS: 20.0000 mg | ORAL_TABLET | Freq: Every day | ORAL | 1 refills | Status: DC

## 2021-09-23 MED ORDER — SPIRONOLACTONE 25 MG PO TABS: 25.0000 mg | ORAL_TABLET | Freq: Every day | ORAL | 1 refills | Status: DC

## 2021-09-23 MED ORDER — DAPAGLIFLOZIN PROPANEDIOL 10 MG PO TABS: 10.0000 mg | ORAL_TABLET | Freq: Every day | ORAL | 1 refills | Status: DC

## 2021-09-24 ENCOUNTER — Encounter: Payer: Self-pay | Admitting: Psychiatry

## 2021-09-24 ENCOUNTER — Telehealth (INDEPENDENT_AMBULATORY_CARE_PROVIDER_SITE_OTHER): Payer: Medicare HMO | Admitting: Psychiatry

## 2021-09-24 ENCOUNTER — Other Ambulatory Visit: Payer: Self-pay

## 2021-09-24 DIAGNOSIS — F3132 Bipolar disorder, current episode depressed, moderate: Secondary | ICD-10-CM

## 2021-09-24 DIAGNOSIS — F4310 Post-traumatic stress disorder, unspecified: Secondary | ICD-10-CM | POA: Diagnosis not present

## 2021-09-24 DIAGNOSIS — F1020 Alcohol dependence, uncomplicated: Secondary | ICD-10-CM | POA: Diagnosis not present

## 2021-09-24 DIAGNOSIS — R69 Illness, unspecified: Secondary | ICD-10-CM | POA: Diagnosis not present

## 2021-09-24 MED ORDER — BUPROPION HCL ER (XL) 150 MG PO TB24: 150.0000 mg | ORAL_TABLET | Freq: Every day | ORAL | 1 refills | Status: DC

## 2021-09-24 NOTE — Patient Instructions (Signed)
Continue sertraline 150 mg daily  Continue Abilify 10 mg daily Continue clonazepam 1 mg twice a day as needed for anxiety Obtain labs (LFT) Consider starting naltrexone 25 mg at night after obtaining labs  Next appointment- 12/13 at 2 PM

## 2021-09-30 ENCOUNTER — Other Ambulatory Visit: Payer: Self-pay | Admitting: Internal Medicine

## 2021-09-30 DIAGNOSIS — K219 Gastro-esophageal reflux disease without esophagitis: Secondary | ICD-10-CM

## 2021-09-30 DIAGNOSIS — I2699 Other pulmonary embolism without acute cor pulmonale: Secondary | ICD-10-CM

## 2021-09-30 DIAGNOSIS — I480 Paroxysmal atrial fibrillation: Secondary | ICD-10-CM

## 2021-10-09 ENCOUNTER — Telehealth: Payer: Medicare HMO

## 2021-10-14 ENCOUNTER — Other Ambulatory Visit: Payer: Self-pay

## 2021-10-14 ENCOUNTER — Ambulatory Visit (INDEPENDENT_AMBULATORY_CARE_PROVIDER_SITE_OTHER): Payer: Medicare HMO | Admitting: Licensed Clinical Social Worker

## 2021-10-14 ENCOUNTER — Telehealth: Payer: Self-pay

## 2021-10-14 DIAGNOSIS — F4310 Post-traumatic stress disorder, unspecified: Secondary | ICD-10-CM | POA: Diagnosis not present

## 2021-10-14 DIAGNOSIS — R69 Illness, unspecified: Secondary | ICD-10-CM | POA: Diagnosis not present

## 2021-10-14 NOTE — Progress Notes (Signed)
Virtual Visit via Video Note  I connected with [name] on 10/14/21 at  2:00 PM EST by a video enabled telemedicine application and verified that I am speaking with the correct person using two identifiers.  Location: Patient: home Provider: remote office [place], [place])   I discussed the limitations of evaluation and management by telemedicine and the availability of in person appointments. The patient expressed understanding and agreed to proceed.  I discussed the assessment and treatment plan with the patient. The patient was provided an opportunity to ask questions and all were answered. The patient agreed with the plan and demonstrated an understanding of the instructions.   The patient was advised to call back or seek an in-person evaluation if the symptoms worsen or if the condition fails to improve as anticipated.  I provided 30 minutes of non-face-to-face time during this encounter.   [name] R Adam Gibas, LCSW   THERAPIST PROGRESS NOTE  Session Time: 2-230p  Participation Level: Active  Behavioral Response: Neat and Well GroomedAlertAnxious and Depressed  Type of Therapy: Individual Therapy  Treatment Goals addressed:  Problem: Reduce the negative impact trauma related symptoms have on social, occupational, and family functioning. Goal: LTG: Reduce frequency, intensity, and duration of PTSD symptoms so daily functioning is improved: Input needed on appropriate metric.  per pt self report Outcome: Progressing Goal: STG: [name] WILL PRACTICE EMOTION REGULATION SKILLS 7 PER WEEK FOR THE NEXT 16 WEEKS Outcome: Progressing Interventions:  Intervention: WORK WITH [name] TO COMPLETE A TRAUMA INTERVIEW IN AN INDIVIDUAL SESSION Intervention: WORK WITH [name] TO IDENTIFY A MINIMUM OF 2 COMMUNICATION PATTERNS THAT RESULT IN CONFLICT WITH OTHERS AND DISCUSS WITH STAFF IN SESSION     Summary: [name] is a 55 y.o. male who presents with improving symptoms related  to PTSD diagnosis. Pt reports that overall mood is stable and that he is managing stressors well. Pt reports that relationships are good between him and wife. 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 Discussed overall health-related concerns and pt is still worried about overall lack of energy and fatigue.  Pt reports that he didn't get the RX for buproprion from last visit. LCSW counselor sent message to MD and CMA to check on Rx.   Allowed pt to identify environmental triggers and discuss how pt balances out stress  .   Suicidal/Homicidal: No  Therapist Response: Pt is continuing to apply interventions learned in session into daily life situations. Pt is currently on track to meet goals utilizing interventions mentioned above. Personal growth and progress noted. Treatment to continue as indicated.   Plan: Return again in 4 weeks.  Diagnosis: Axis I: PTSD    Axis II: No diagnosis    Adam Bo [name], LCSW 10/14/2021

## 2021-10-14 NOTE — Telephone Encounter (Signed)
Pt was told that the pharmacy should have medication because it was sent on 09-24-21 @ 10:55.  Pt was told that I tried to call pharmacy but it was closed today.     Outpatient Medication Detail   Disp Refills Start End   buPROPion (WELLBUTRIN XL) 150 MG 24 hr tablet 30 tablet 1 09/24/2021 11/23/2021   Sig - Route: Take 1 tablet (150 mg total) by mouth daily. - Oral   Sent to pharmacy as: buPROPion (WELLBUTRIN XL) 150 MG 24 hr tablet   E-Prescribing Status: Receipt confirmed by pharmacy (09/24/2021 10:55 AM EDT)

## 2021-10-14 NOTE — Telephone Encounter (Signed)
tried to call the pharmacy but it was a message that pharmacy was closed today.

## 2021-10-14 NOTE — Telephone Encounter (Signed)
pt therapist sent message that pt told her that the pharmacy did not have his wellbutrin sent to the pharamcy.

## 2021-10-15 NOTE — Plan of Care (Signed)
  Problem: Reduce impulsive actions while increasing concentration and focus on self regulation Goal: LTG: Manson WILL REDUCE THE AMOUNT OF ANGER-RELATED INCIDENTS/OUTBURST BY 100%. Outcome: Progressing Goal: STG: [name] will identify situations, thoughts, and feelings that trigger internal anger, angry verbal and/or aggressive behavioral actions as evidenced by a self-recorded report. Outcome: Progressing Intervention: WORK WITH [name] TO IDENTIFY AT LEAST 1 TRIGGER THOUGHTS (THOUGHTS THAT TRIGGER AN ANGER RESPONSE) Intervention: WORK WITH [name] TO DEVELOP A COPING PLAN Intervention: ENCOURAGE [name] TO COMPLETE AN ANGER LOG EACH WEEK FOR THE NEXT 4 WEEKS

## 2021-10-15 NOTE — Plan of Care (Signed)
  Problem: Reduce the negative impact trauma related symptoms have on social, occupational, and family functioning. Goal: LTG: Reduce frequency, intensity, and duration of PTSD symptoms so daily functioning is improved: Input needed on appropriate metric.  per pt self report Outcome: Progressing Goal: STG: [name] WILL PRACTICE EMOTION REGULATION SKILLS 7 PER WEEK FOR THE NEXT 16 WEEKS Outcome: Progressing Intervention: WORK WITH [name] TO COMPLETE A TRAUMA INTERVIEW IN AN INDIVIDUAL SESSION Intervention: WORK WITH [name] TO IDENTIFY A MINIMUM OF 2 COMMUNICATION PATTERNS THAT RESULT IN CONFLICT WITH OTHERS AND DISCUSS WITH STAFF IN SESSION

## 2021-10-17 ENCOUNTER — Ambulatory Visit (INDEPENDENT_AMBULATORY_CARE_PROVIDER_SITE_OTHER): Payer: Medicare HMO | Admitting: Pharmacist

## 2021-10-17 ENCOUNTER — Other Ambulatory Visit: Payer: Self-pay

## 2021-10-17 DIAGNOSIS — E118 Type 2 diabetes mellitus with unspecified complications: Secondary | ICD-10-CM

## 2021-10-17 DIAGNOSIS — E876 Hypokalemia: Secondary | ICD-10-CM

## 2021-10-17 DIAGNOSIS — I10 Essential (primary) hypertension: Secondary | ICD-10-CM

## 2021-10-17 DIAGNOSIS — I480 Paroxysmal atrial fibrillation: Secondary | ICD-10-CM

## 2021-10-17 DIAGNOSIS — F319 Bipolar disorder, unspecified: Secondary | ICD-10-CM

## 2021-10-17 DIAGNOSIS — E785 Hyperlipidemia, unspecified: Secondary | ICD-10-CM

## 2021-10-17 DIAGNOSIS — I5042 Chronic combined systolic (congestive) and diastolic (congestive) heart failure: Secondary | ICD-10-CM

## 2021-10-17 DIAGNOSIS — T502X5A Adverse effect of carbonic-anhydrase inhibitors, benzothiadiazides and other diuretics, initial encounter: Secondary | ICD-10-CM

## 2021-10-17 NOTE — Patient Instructions (Signed)
Visit Information  Phone number for Pharmacist: [phone]   Goals Addressed             This Visit's Progress    Manage My Medicine       Timeframe:  Long-Range Goal Priority:  High Start Date:  04/17/21                           Expected End Date:     04/17/22                  Follow Up Date Feb 2023   - call for medicine refill 2 or 3 days before it runs out - call if I am sick and can't take my medicine - keep a list of all the medicines I take; vitamins and herbals too - use a pillbox to sort medicine  -Take potassium with torsemide   Why is this important?   These steps will help you keep on track with your medicines.   Notes:         Care Plan : CCM Pharmacy Care Plan  Updates made by [name], RPH since 10/17/2021 12:00 AM     Problem: Hypertension, Hyperlipidemia, Diabetes, Atrial Fibrillation, Heart Failure, Depression and Anxiety   Priority: High     Long-Range Goal: Disease management   Start Date: 01/15/2021  Expected End Date: 07/15/2021  This Visit's Progress: On track  Recent Progress: On track  Priority: High  Note:   Current Barriers:  Unable to independently monitor therapeutic efficacy Unable to achieve control of mood/fatigue   Pharmacist Clinical Goal(s):  Patient will achieve adherence to monitoring guidelines and medication adherence to achieve therapeutic efficacy maintain control of mood/fatigue as evidenced by patient report  through collaboration with PharmD and provider.   Interventions: 1:1 collaboration with [name], MD regarding development and update of comprehensive plan of care as evidenced by provider attestation and co-signature Inter-disciplinary care team collaboration (see longitudinal plan of care) Comprehensive medication review performed; medication list updated in electronic medical record  Hypertension / Heart Failure (BP goal < 130/80) Controlled - BP is at goal; pt endorses compliance with  medications and denies issues; he reports swelling is under control with current diuretic regimen -pt reports he only takes potassium "as needed" - when asked how he can tell he needs it, he reports when he feels heart palpitations or leg cramps -Last ejection fraction 01/23/2020: 20-25% -NYHA Class: II (slight limitation of activity) Home BP readings: 120/80 at pharmacy last week Current treatment:  Losartan 25 mg daily Spironolactone 25 mg daily Torsemide 20 mg - 2 tab daily Farxiga 10 mg daily Potassium chloride 20 mEq - takes PRN Interventions: Counseled on benefits of ARB, beta blocker, spironolactone, and Farxiga in heart failure Advised to check BP at home periodically, limit salt Discussed most recent potassium was 3.7 in Sept 2022, goal is > 4; advised to take potassium daily with torsemide; pt has f/u appt 12/2 with HF clinic   Atrial Fibrillation (Goal: prevent stroke and major bleeding) -Controlled - pt endorses compliance with medication; does not endorses bleeding complications; he reports feeling better since reducing amiodarone to 1 tablet -CHADSVASC: 3 -Current treatment: Amiodarone 200 mg daily Xarelto 20 mg daily -Counseled on increased risk of stroke due to Afib and benefits of anticoagulation for stroke prevention; importance of adherence to anticoagulant exactly as prescribed; bleeding risk associated with Xarelto and importance of self-monitoring for signs/symptoms  of bleeding; -Recommended to continue current medication  Hyperlipidemia (LDL goal < 100 and TRIG goal < 150) Controlled  - LDL improved from 164 > 101 after restarting statin; Trig at goal (excluding 05/2021 labs when pt was intubated/on propofol); pt endorses compliance and denies side effects Current regimen:  Atorvastatin 40 mg daily Vascepa 1 g - 2 cap twice a day Interventions: Discussed cholesterol goals and benefits of medications for prevention of heart attack / stroke Discussed dosing for  Vascepa is 2 capsules twice a day - previously was only taking 2 capsules per day Recommend to continue current medication   Diabetes (A1c goal < 7%) Controlled  - A1c is at goal; Wilder Glade is more for heart failure than DM Current regimen:  Farxiga 10 mg daily Interventions: Discussed blood sugar goals and benefits of medications for prevention of diabetic complications Discussed mechanism of Farxiga and additional benefits for heart failure Recommend to continue current medication   Bipolar Depression Not ideally controlled - pt follows with Dr Modesta Messing for therapy and medication management; pt c/o fatigue consistently; he was recently prescribed bupropion, just picked up and has not started yet Current regimen:  Aripiprazole 10 mg daily Sertraline 150 mg daily Clonazepam 1 mg twice a day as needed Bupropion XL 150 mg daily - not started yet Interventions: Discussed fatigue as medication side effects vs symptom of depression vs physical deconditioning Counseled on benefits of bupropion for fatigue/motivation; discussed potential for increased anxiety; advised adequate trial of 4-6 weeks unless intolerable side effects occur Recommend to continue current medication  Patient Goals/Self-Care Activities Patient will:  - take medications as prescribed -focus on medication adherence by pill box -check blood pressure periodically -weigh daily, and contact provider if weight gain of 3 lbs overnight or 5 lbs in a week -target a minimum of 150 minutes of moderate intensity exercise weekly -Take potassium with torsemide (goal potassium > 4)      Patient verbalizes understanding of instructions provided today and agrees to view in [place].  Telephone follow up appointment with pharmacy team member scheduled for: 3 months  Charlene Brooke, PharmD, Para [place], CPP Clinical Pharmacist Practitioner [facility] [phone]

## 2021-10-17 NOTE — Progress Notes (Signed)
Chronic Care Management Pharmacy Note  10/17/2021 Name:  [name] MRN:  [id number] DOB:  Jul 16, 1966  Summary: -Pt reports swelling is under control with torsemide 20 mg BID. He is not taking potassium except "as needed" - which is when he feels heart palpitations or leg cramps -Pt has not started bupropion yet (prescribed 10/25 per psych) - counseled on benefits for fatigue and potential side effects (anxiety)  Recommendations/Changes made from today's visit: -Advised to take potassium with torsemide; f/u with [name] 12/2 -Advised to start bupropion; take in AM   Subjective: [name] is an 55 y.o. year old male who is a primary patient of [name], MD.  The CCM team was consulted for assistance with disease management and care coordination needs.    Engaged with patient by telephone for follow up visit in response to provider referral for pharmacy case management and/or care coordination services.   Consent to Services:  The patient was given information about Chronic Care Management services, agreed to services, and gave verbal consent prior to initiation of services.  Please see initial visit note for detailed documentation.   Patient Care Team: [name], MD as PCP - General (Internal Medicine) [name], MD as PCP - Cardiology (Cardiology) [name], MD as PCP - Advanced Heart Failure (Cardiology) [name], MD as Consulting Physician (Cardiology) [name], [facility] as Pharmacist (Pharmacist)  Recent office visits: 07/08/21 [name] OV: f/u - c/o SOB, LE edema, wt gain. Increase spironolactone to 25 mg daily. Changed potassium to 15 meq BID  11/19/20 [name] OV: CPX. LFTs mildly elevated, avoid alcohol. LDL above goal, counseled statin compliance. Rx'd thiamine 100 mg daily.  06/22/20 [name] OV: acute visit for pubic abscess - rx Bactrim and mupirocin  Recent consult visits: 09/24/21 Dr Modesta Messing (psych): start  bupropion to target depression/fatigue. Plan to start naltrexone for alcohol use if LFTs are normal  08/13/21 Dr Modesta Messing (psych): increased sertraline to 150 mg daily. D/C gabapentin due to not taking. Ordered LFT. Referred for therapy.  07/19/21 NP Allena Katz (HF clinic): start metolazone 2.5 mg x 2 days. Increase torsemide to 60 mg BID x 3 days then back to 20 mg daily. Increase potassium to 40 mEq x 2 days. Start Keflex for cellulitis.  07/29/21 NP Allena Katz (HF clinic): increase torsemide to 40 mg daily. Rx'd sildenafil 20 mg Prn. Off digoxin and Toprol d/t bradycardia. No Entresto d/t hx angioedema.  4/21/2 [name] (psych): f/u visit, no med changes. Pt moved out and is planning to file for divorce.  02/22/21 [name] (cardiology/CHF): started losartan 12.5 mg, repeat BMET in 10 days. (Pt did not pick up losartan or return for labs)  12/13/20 [name] (psych): rx'd strattera for inattention/trouble focusing. Of note pt and wife having difficulties, he had moved out for a while.  12/07/20 [name] (cardiology): f/u OSA, will get new BiPAP device since old one was broken.  Hospital visits: Medication Reconciliation was completed by comparing discharge summary, patient's EMR and Pharmacy list, and upon discussion with patient.   Admitted to the hospital on 05/09/21 due to sob and chest pain. Discharge date was 05/19/21. Discharged from [facility].   -intubated in ED. SOB likely 2/2 severe volume overload. Extubated 6/13. 6/14 refractory Afib led to Vfib arrest x 1 min, reintubation. 6/15 extubated.   New?Medications Started at [facility] Discharge:?? -started amiodarone, gabapentin  Medication Changes at Hospital Discharge: -Changed  spironolactone to 12.5 mg daily -Changed torsemide to 20 mg daily  Medications Discontinued at Hospital Discharge: -Stopped metoprolol, sildenafil   Medications that remain the same after Hospital Discharge:??  -All other medications will remain  the same.    Objective:  Lab Results  Component Value Date   CREATININE 1.26 (H) 08/14/2021   BUN 15 08/14/2021   GFR 59.20 (L) 07/08/2021   GFRNONAA >60 08/14/2021   GFRAA >60 05/15/2020   NA 138 08/14/2021   K 3.6 08/14/2021   CALCIUM 9.0 08/14/2021   CO2 22 08/14/2021    Lab Results  Component Value Date/Time   HGBA1C 6.2 (H) 05/10/2021 08:53 AM   HGBA1C 5.8 (H) 02/22/2021 12:11 PM   HGBA1C 8.7 05/25/2018 12:00 AM   HGBA1C 8.7 05/25/2018 12:00 AM   HGBA1C 6.9 (H) 12/25/2015 10:55 AM   GFR 59.20 (L) 07/08/2021 11:58 AM   GFR 57.72 (L) 05/31/2021 04:05 PM   MICROALBUR 2.1 (H) 11/19/2020 02:54 PM   MICROALBUR 0.9 01/19/2019 04:39 PM    Last diabetic Eye exam:  Lab Results  Component Value Date/Time   HMDIABEYEEXA No Retinopathy 01/26/2019 12:00 AM    Last diabetic Foot exam: No results found for: HMDIABFOOTEX   Lab Results  Component Value Date   CHOL 157 02/22/2021   HDL 37 (L) 02/22/2021   LDLCALC 101 (H) 02/22/2021   LDLDIRECT 114.0 03/21/2020   TRIG 221 (H) 05/14/2021   CHOLHDL 4.2 02/22/2021    Hepatic Function Latest Ref Rng & Units 05/14/2021 05/14/2021 05/09/2021  Total Protein 6.5 - 8.1 g/dL 8.3(H) 8.2(H) 7.8  Albumin 3.5 - 5.0 g/dL 3.2(L) 3.2(L) 3.6  AST 15 - 41 U/L 65(H) 83(H) 39  ALT 0 - 44 U/L 63(H) 65(H) 53(H)  Alk Phosphatase 38 - 126 U/L 138(H) 136(H) 189(H)  Total Bilirubin 0.3 - 1.2 mg/dL 2.2(H) 3.0(H) 0.9  Bilirubin, Direct 0.0 - 0.2 mg/dL 0.7(H) - -    Lab Results  Component Value Date/Time   TSH 1.53 07/08/2021 11:58 AM   TSH 1.27 03/21/2020 02:55 PM    CBC Latest Ref Rng & Units 06/19/2021 05/31/2021 05/19/2021  WBC 4.0 - 10.5 K/uL 8.4 7.5 8.4  Hemoglobin 13.0 - 17.0 g/dL 16.1 15.4 15.6  Hematocrit 39.0 - 52.0 % 44.9 43.7 44.1  Platelets 150 - 400 K/uL 244 261.0 206    Lab Results  Component Value Date/Time   VD25OH 44.92 11/19/2020 02:54 PM   VD25OH 48.62 04/12/2019 10:41 AM    Clinical ASCVD: No  The 10-year ASCVD risk score  (Arnett DK, et al., 2019) is: 15.2%   Values used to calculate the score:     Age: 50 years     Sex: Male     Is Non-Hispanic African American: Yes     Diabetic: Yes     Tobacco smoker: No     Systolic Blood Pressure: 825 mmHg     Is BP treated: Yes     HDL Cholesterol: 37 mg/dL     Total Cholesterol: 157 mg/dL    Depression screen [facility] 2/9 09/10/2021 08/13/2021 07/08/2021  Decreased Interest 3 3 0  Down, Depressed, Hopeless 3 1 0  PHQ - 2 Score 6 4 0  Altered sleeping 1 0 -  Tired, decreased energy 3 3 -  Change in appetite 1 1 -  Feeling bad or failure about yourself  3 3 -  Trouble concentrating 3 3 -  Moving slowly or fidgety/restless 1 0 -  Suicidal thoughts 0  0 -  PHQ-9 Score 18 14 -  Difficult doing work/chores Somewhat difficult Somewhat difficult -  Some recent data might be hidden    CHA2DS2-VASc Score = 3  The patient's score is based upon: CHF History: 1 HTN History: 1 Diabetes History: 1 Stroke History: 0 Vascular Disease History: 0 Age Score: 0 Gender Score: 0      Social History   Tobacco Use  Smoking Status Former   Packs/day: 0.50   Years: 30.00   Pack years: 15.00   Types: Cigarettes   Quit date: 05/15/2020   Years since quitting: 1.4  Smokeless Tobacco Never  Tobacco Comments   vaping - nicotine-free products   BP Readings from Last 3 Encounters:  07/29/21 114/76  07/19/21 133/76  07/08/21 120/78   Pulse Readings from Last 3 Encounters:  07/29/21 63  07/19/21 68  07/08/21 73   Wt Readings from Last 3 Encounters:  07/29/21 (!) 385 lb 3.2 oz (174.7 kg)  07/19/21 (!) 387 lb (175.5 kg)  07/08/21 (!) 384 lb (174.2 kg)   BMI Readings from Last 3 Encounters:  07/29/21 56.88 kg/m  07/19/21 57.15 kg/m  07/08/21 57.54 kg/m   Assessment/Interventions: Review of patient past medical history, allergies, medications, health status, including review of consultants reports, laboratory and other test data, was performed as part of comprehensive  evaluation and provision of chronic care management services.   SDOH:  (Social Determinants of Health) assessments and interventions performed: Yes   SDOH Screenings   Alcohol Screen: Low Risk    Last Alcohol Screening Score (AUDIT): 0  Depression (PHQ2-9): Medium Risk   PHQ-2 Score: 18  Financial Resource Strain: Low Risk    Difficulty of Paying Living Expenses: Not hard at all  Food Insecurity: No Food Insecurity   Worried About Charity fundraiser in the Last Year: Never true   Ran Out of Food in the Last Year: Never true  Housing: Low Risk    Last Housing Risk Score: 0  Physical Activity: Sufficiently Active   Days of Exercise per Week: 5 days   Minutes of Exercise per Session: 30 min  Social Connections: Moderately Isolated   Frequency of Communication with Friends and Family: More than three times a week   Frequency of Social Gatherings with Friends and Family: Never   Attends Religious Services: Never   Marine scientist or Organizations: No   Attends Music therapist: Never   Marital Status: Married  Stress: No Stress Concern Present   Feeling of Stress : Not at all  Tobacco Use: Medium Risk   Smoking Tobacco Use: Former   Smokeless Tobacco Use: Never   Passive Exposure: Not on Pensions consultant Needs: No Transportation Needs   Lack of Transportation (Medical): No   Lack of Transportation (Non-Medical): No    CCM Care Plan  Allergies  Allergen Reactions   Ace Inhibitors Anaphylaxis and Swelling    Angioedema   Bidil [Isosorb Dinitrate-Hydralazine] Other (See Comments)    headache   Digoxin And Related     Unspecified "side effects"   Buspirone Other (See Comments)    dizziness    Medications Reviewed Today     Reviewed by [name], [facility] (Pharmacist) on 10/17/21 at 1506  Med List Status: <None>   Medication Order Taking? Sig Documenting Provider Last Dose Status Informant  albuterol (VENTOLIN HFA) 108 (90 Base) MCG/ACT  inhaler [id number] Yes Inhale 1-2 puffs into the lungs every 4 (four) hours  as needed for shortness of breath or wheezing. [provider] Taking Active   amiodarone (PACERONE) 200 MG tablet [id number] Yes Take 1 tablet (200 mg total) by mouth daily. Patient taking once daily Milford, Jessica M, FNP Taking Active   ARIPiprazole (ABILIFY) 10 MG tablet [id number] Yes Take 1 tablet (10 mg total) by mouth daily. Hisada, Reina, MD Taking Active   atorvastatin (LIPITOR) 40 MG tablet [id number] Yes Take 1 tablet (40 mg total) by mouth daily. Jones, Thomas L, MD Taking Active   blood glucose meter kit and supplies KIT [id number] Yes Use to check blood sugar. DX E11.9 Murray, Laura Woodruff, FNP Taking Active   buPROPion (WELLBUTRIN XL) 150 MG 24 hr tablet [id number]  Take 1 tablet (150 mg total) by mouth daily. Hisada, Reina, MD  Active   clonazePAM (KLONOPIN) 1 MG tablet [id number] Yes Take 1 tablet (1 mg total) by mouth 2 (two) times daily as needed for anxiety. Kaur, Mandeep, MD Taking Active   dapagliflozin propanediol (FARXIGA) 10 MG TABS tablet [id number] Yes Take 1 tablet (10 mg total) by mouth daily. Jones, Thomas L, MD Taking Active   losartan (COZAAR) 25 MG tablet [id number]  TAKE 0.5 TABLETS BY MOUTH AT BEDTIME. McLean, Dalton S, MD  Active   pantoprazole (PROTONIX) 40 MG tablet [id number] Yes TAKE 1 TABLET BY MOUTH EVERY DAY Jones, Thomas L, MD Taking Active   potassium chloride SA (KLOR-CON) 20 MEQ tablet [id number] Yes Take 2 tablets (40 mEq total) by mouth daily.  Patient taking differently: Take 20 mEq by mouth daily.   Milford, Jessica M, FNP Taking Active     Discontinued 02/20/12 1023 (Patient has not taken in last 30 days)   PRESCRIPTION MEDICATION [id number] Yes Inhale into the lungs See admin instructions. Bipap, pressure 16/12 with 2L of O2 - use whenever sleeping [provider] Taking Active Self  sertraline (ZOLOFT) 100 MG tablet [id number] Yes Take 1.5 tablets (150 mg total) by  mouth daily. Hisada, Reina, MD Taking Active   sildenafil (REVATIO) 20 MG tablet [id number] Yes TAKE 1 TABLET BY MOUTH AS NEEDED, MAY take UP TO FOUR TABLETS AS NEEDED McLean, Dalton S, MD Taking Active   spironolactone (ALDACTONE) 25 MG tablet [id number] Yes Take 1 tablet (25 mg total) by mouth daily. Milford, Jessica M, FNP Taking Active   thiamine 100 MG tablet [id number] Yes Take 1 tablet (100 mg total) by mouth daily. Milford, Jessica M, FNP Taking Active   torsemide (DEMADEX) 20 MG tablet [id number] Yes Take 2 tablets (40 mg total) by mouth daily. Milford, Jessica M, FNP Taking Active   vitamin B-12 (CYANOCOBALAMIN) 1000 MCG tablet [id number] Yes Take 1,000 mcg by mouth daily. [provider] Taking Active   XARELTO 20 MG TABS tablet [id number] Yes TAKE 1 TABLET BY MOUTH EVERY DAY Jones, Thomas L, MD Taking Active             Patient Active Problem List   Diagnosis Date Noted   Diuretic-induced hypokalemia 07/08/2021   Respiratory failure (HCC) 05/10/2021   Hemoptysis 05/10/2021   ARDS (adult respiratory distress syndrome) (HCC)    Colon cancer screening 11/19/2020   Hyperlipidemia with target LDL less than 100 10/12/2019   Screen for colon cancer 10/12/2019   Hidradenitis suppurativa of left axilla 10/12/2019   Benign prostatic hyperplasia without lower urinary tract symptoms 10/12/2019   Claudication of both lower extremities (HCC) 04/20/2019   Drug-induced erectile dysfunction 02/28/2019   Vitamin D deficiency disease 01/20/2019   Osteoarthritis   of both ankles and feet 11/30/2018   Diabetic neuropathy, painful ([place]) 11/30/2018   Fatty liver disease, nonalcoholic 19/75/8832   Thiamin deficiency 04/29/2018   Cholelithiasis 04/22/2018   Hyperbilirubinemia    PE (pulmonary thromboembolism) ([place]) 04/12/2017   Hereditary and idiopathic peripheral neuropathy 07/16/2016   AF (paroxysmal atrial fibrillation) (HCC)    Osteoarthritis of both knees 12/27/2014   NSVT  (nonsustained ventricular tachycardia) ([place]) 09/26/2013   Alcohol abuse 08/10/2013   Hypertriglyceridemia 06/29/2013   Nonischemic cardiomyopathy ([place]) 01/26/2013   Type II diabetes mellitus with manifestations ([place]) 04/13/2012   Tobacco user 02/18/2012   ED (erectile dysfunction) 07/11/2011   ATRIAL FIBRILLATION, PAROXYSMAL 09/18/2010   Morbid obesity ([place]) 06/11/2010   Essential hypertension 06/11/2010   Chronic combined systolic and diastolic CHF (congestive heart failure) ([place]) 06/11/2010   Esophageal reflux 06/11/2010   OSA (obstructive sleep apnea) with BiPap and oxygen 06/11/2010    Immunization History  Administered Date(s) Administered   19-influenza Whole 10/20/2011   Hep A / Hep B 05/25/2018   Pneumococcal Polysaccharide-23 08/11/2014   Tdap 10/20/2011    Conditions to be addressed/monitored:  Hypertension, Hyperlipidemia, Diabetes, Atrial Fibrillation, Heart Failure, Depression and Anxiety  Care Plan : [place]  Updates made by [name], [place] since 10/17/2021 12:00 AM     Problem: Hypertension, Hyperlipidemia, Diabetes, Atrial Fibrillation, Heart Failure, Depression and Anxiety   Priority: High     Long-Range Goal: Disease management   Start Date: 01/15/2021  Expected End Date: 07/15/2021  This Visit's Progress: On track  Recent Progress: On track  Priority: High  Note:   Current Barriers:  Unable to independently monitor therapeutic efficacy Unable to achieve control of mood/fatigue   Pharmacist Clinical Goal(s):  Patient will achieve adherence to monitoring guidelines and medication adherence to achieve therapeutic efficacy maintain control of mood/fatigue as evidenced by patient report  through collaboration with PharmD and provider.   Interventions: 1:1 collaboration with [name], MD regarding development and update of comprehensive plan of care as evidenced by provider attestation and co-signature Inter-disciplinary  care team collaboration (see longitudinal plan of care) Comprehensive medication review performed; medication list updated in electronic medical record  Hypertension / Heart Failure (BP goal < 130/80) Controlled - BP is at goal; pt endorses compliance with medications and denies issues; he reports swelling is under control with current diuretic regimen -pt reports he only takes potassium "as needed" - when asked how he can tell he needs it, he reports when he feels heart palpitations or leg cramps -Last ejection fraction 01/23/2020: 20-25% -NYHA Class: II (slight limitation of activity) Home BP readings: 120/80 at pharmacy last week Current treatment:  Losartan 25 mg daily Spironolactone 25 mg daily Torsemide 20 mg - 2 tab daily Farxiga 10 mg daily Potassium chloride 20 mEq - takes PRN Interventions: Counseled on benefits of ARB, beta blocker, spironolactone, and Farxiga in heart failure Advised to check BP at home periodically, limit salt Discussed most recent potassium was 3.7 in Sept 2022, goal is > 4; advised to take potassium daily with torsemide; pt has f/u appt 12/2 with HF clinic   Atrial Fibrillation (Goal: prevent stroke and major bleeding) -Controlled - pt endorses compliance with medication; does not endorses bleeding complications; he reports feeling better since reducing amiodarone to 1 tablet -CHADSVASC: 3 -Current treatment: Amiodarone 200 mg daily Xarelto 20 mg daily -Counseled on increased risk of stroke due to Afib and benefits of anticoagulation for stroke prevention; importance of adherence  to anticoagulant exactly as prescribed; bleeding risk associated with Xarelto and importance of self-monitoring for signs/symptoms of bleeding; -Recommended to continue current medication  Hyperlipidemia (LDL goal < 100 and TRIG goal < 150) Controlled  - LDL improved from 164 > 101 after restarting statin; Trig at goal (excluding 05/2021 labs when pt was intubated/on propofol);  pt endorses compliance and denies side effects Current regimen:  Atorvastatin 40 mg daily Vascepa 1 g - 2 cap twice a day Interventions: Discussed cholesterol goals and benefits of medications for prevention of heart attack / stroke Discussed dosing for Vascepa is 2 capsules twice a day - previously was only taking 2 capsules per day Recommend to continue current medication   Diabetes (A1c goal < 7%) Controlled  - A1c is at goal; Farxiga is more for heart failure than DM Current regimen:  Farxiga 10 mg daily Interventions: Discussed blood sugar goals and benefits of medications for prevention of diabetic complications Discussed mechanism of Farxiga and additional benefits for heart failure Recommend to continue current medication   Bipolar Depression Not ideally controlled - pt follows with Dr Hisada for therapy and medication management; pt c/o fatigue consistently; he was recently prescribed bupropion, just picked up and has not started yet Current regimen:  Aripiprazole 10 mg daily Sertraline 150 mg daily Clonazepam 1 mg twice a day as needed Bupropion XL 150 mg daily - not started yet Interventions: Discussed fatigue as medication side effects vs symptom of depression vs physical deconditioning Counseled on benefits of bupropion for fatigue/motivation; discussed potential for increased anxiety; advised adequate trial of 4-6 weeks unless intolerable side effects occur Recommend to continue current medication  Patient Goals/Self-Care Activities Patient will:  - take medications as prescribed -focus on medication adherence by pill box -check blood pressure periodically -weigh daily, and contact provider if weight gain of 3 lbs overnight or 5 lbs in a week -target a minimum of 150 minutes of moderate intensity exercise weekly -Take potassium with torsemide (goal potassium > 4)     Compliance/Adherence/Medication fill history: Care Gaps: Colonoscopy (never done) Eye exam  (due 01/27/20)  Star-[facility] Drugs: Atorvastatin - LF 08/12/21 x 90 ds (PDC 100%) Farxiga - LF 09/22/21 x 90 ds (PDC 100%) Losartan - LF 09/05/21 x 90 ds (PDC 98%)  Medication Assistance: None required.  Patient affirms current coverage meets needs.  Patient's preferred pharmacy is:  [id number] - [place], [place] - [address] [place] [address] Phone: [phone] Fax: [phone]  Uses pill box? Yes Pt endorses 90% compliance  We discussed: Current pharmacy is preferred with insurance plan and patient is satisfied with pharmacy services Patient decided to: Continue current medication management strategy  Care Plan and Follow Up Patient Decision:  Patient agrees to Care Plan and Follow-up.  Plan: Telephone follow up appointment with care management team member scheduled for:  3 months   , PharmD, BCACP, CPP Clinical Pharmacist Practitioner [facility] [place] [phone]  

## 2021-10-30 DIAGNOSIS — G4733 Obstructive sleep apnea (adult) (pediatric): Secondary | ICD-10-CM | POA: Diagnosis not present

## 2021-10-30 DIAGNOSIS — I110 Hypertensive heart disease with heart failure: Secondary | ICD-10-CM | POA: Diagnosis not present

## 2021-10-30 DIAGNOSIS — E1169 Type 2 diabetes mellitus with other specified complication: Secondary | ICD-10-CM | POA: Diagnosis not present

## 2021-10-30 DIAGNOSIS — E785 Hyperlipidemia, unspecified: Secondary | ICD-10-CM | POA: Diagnosis not present

## 2021-10-30 DIAGNOSIS — I480 Paroxysmal atrial fibrillation: Secondary | ICD-10-CM

## 2021-10-30 DIAGNOSIS — I5042 Chronic combined systolic (congestive) and diastolic (congestive) heart failure: Secondary | ICD-10-CM | POA: Diagnosis not present

## 2021-11-01 ENCOUNTER — Encounter (HOSPITAL_COMMUNITY): Payer: Self-pay | Admitting: Cardiology

## 2021-11-01 ENCOUNTER — Other Ambulatory Visit: Payer: Self-pay

## 2021-11-01 ENCOUNTER — Ambulatory Visit (HOSPITAL_COMMUNITY)
Admission: RE | Admit: 2021-11-01 | Discharge: 2021-11-01 | Disposition: A | Discharge status: Home / Self Care | Payer: Medicare HMO | Source: Ambulatory Visit | Attending: Cardiology | Admitting: Cardiology

## 2021-11-01 VITALS — BP 116/88 | HR 71 | Wt 373.8 lb

## 2021-11-01 DIAGNOSIS — Z79899 Other long term (current) drug therapy: Secondary | ICD-10-CM | POA: Diagnosis not present

## 2021-11-01 DIAGNOSIS — Z7984 Long term (current) use of oral hypoglycemic drugs: Secondary | ICD-10-CM | POA: Insufficient documentation

## 2021-11-01 DIAGNOSIS — I480 Paroxysmal atrial fibrillation: Secondary | ICD-10-CM | POA: Insufficient documentation

## 2021-11-01 DIAGNOSIS — Z6841 Body Mass Index (BMI) 40.0 and over, adult: Secondary | ICD-10-CM | POA: Insufficient documentation

## 2021-11-01 DIAGNOSIS — Z7901 Long term (current) use of anticoagulants: Secondary | ICD-10-CM | POA: Diagnosis not present

## 2021-11-01 DIAGNOSIS — F419 Anxiety disorder, unspecified: Secondary | ICD-10-CM | POA: Insufficient documentation

## 2021-11-01 DIAGNOSIS — E785 Hyperlipidemia, unspecified: Secondary | ICD-10-CM | POA: Diagnosis not present

## 2021-11-01 DIAGNOSIS — I5042 Chronic combined systolic (congestive) and diastolic (congestive) heart failure: Secondary | ICD-10-CM | POA: Diagnosis not present

## 2021-11-01 DIAGNOSIS — I447 Left bundle-branch block, unspecified: Secondary | ICD-10-CM | POA: Insufficient documentation

## 2021-11-01 DIAGNOSIS — Z87891 Personal history of nicotine dependence: Secondary | ICD-10-CM | POA: Insufficient documentation

## 2021-11-01 DIAGNOSIS — F410 Panic disorder [episodic paroxysmal anxiety] without agoraphobia: Secondary | ICD-10-CM | POA: Diagnosis not present

## 2021-11-01 DIAGNOSIS — Z86711 Personal history of pulmonary embolism: Secondary | ICD-10-CM | POA: Insufficient documentation

## 2021-11-01 DIAGNOSIS — I110 Hypertensive heart disease with heart failure: Secondary | ICD-10-CM | POA: Insufficient documentation

## 2021-11-01 DIAGNOSIS — I472 Ventricular tachycardia, unspecified: Secondary | ICD-10-CM | POA: Insufficient documentation

## 2021-11-01 DIAGNOSIS — I428 Other cardiomyopathies: Secondary | ICD-10-CM | POA: Insufficient documentation

## 2021-11-01 DIAGNOSIS — Z09 Encounter for follow-up examination after completed treatment for conditions other than malignant neoplasm: Secondary | ICD-10-CM | POA: Insufficient documentation

## 2021-11-01 DIAGNOSIS — I4901 Ventricular fibrillation: Secondary | ICD-10-CM | POA: Diagnosis not present

## 2021-11-01 DIAGNOSIS — G4733 Obstructive sleep apnea (adult) (pediatric): Secondary | ICD-10-CM | POA: Insufficient documentation

## 2021-11-01 DIAGNOSIS — R69 Illness, unspecified: Secondary | ICD-10-CM | POA: Diagnosis not present

## 2021-11-01 LAB — CBC
HCT: 46.1 % (ref 39.0–52.0)
Hemoglobin: 16.6 g/dL (ref 13.0–17.0)
MCH: 29.6 pg (ref 26.0–34.0)
MCHC: 36 g/dL (ref 30.0–36.0)
MCV: 82.3 fL (ref 80.0–100.0)
Platelets: 227 10*3/uL (ref 150–400)
RBC: 5.6 MIL/uL (ref 4.22–5.81)
RDW: 13.8 % (ref 11.5–15.5)
WBC: 7.4 10*3/uL (ref 4.0–10.5)
nRBC: 0 % (ref 0.0–0.2)

## 2021-11-01 LAB — COMPREHENSIVE METABOLIC PANEL
ALT: 25 U/L (ref 0–44)
AST: 21 U/L (ref 15–41)
Albumin: 3.8 g/dL (ref 3.5–5.0)
Alkaline Phosphatase: 110 U/L (ref 38–126)
Anion gap: 11 (ref 5–15)
BUN: 17 mg/dL (ref 6–20)
CO2: 25 mmol/L (ref 22–32)
Calcium: 9 mg/dL (ref 8.9–10.3)
Chloride: 103 mmol/L (ref 98–111)
Creatinine, Ser: 1.6 mg/dL — ABNORMAL HIGH (ref 0.61–1.24)
GFR, Estimated: 51 mL/min — ABNORMAL LOW (ref >60)
Glucose, Bld: 148 mg/dL — ABNORMAL HIGH (ref 70–99)
Potassium: 3.7 mmol/L (ref 3.5–5.1)
Sodium: 139 mmol/L (ref 135–145)
Total Bilirubin: 1 mg/dL (ref 0.3–1.2)
Total Protein: 7.9 g/dL (ref 6.5–8.1)

## 2021-11-01 LAB — TSH: TSH: 2.183 u[IU]/mL (ref 0.350–4.500)

## 2021-11-01 MED ORDER — CARVEDILOL 3.125 MG PO TABS: 3.1250 mg | ORAL_TABLET | Freq: Two times a day (BID) | ORAL | 6 refills | Status: DC

## 2021-11-01 MED ORDER — SILDENAFIL CITRATE 50 MG PO TABS: 50.0000 mg | ORAL_TABLET | Freq: Every day | ORAL | 5 refills | Status: DC | PRN

## 2021-11-01 NOTE — Patient Instructions (Signed)
Start Carvedilol 3.125 mg Twice daily   Labs done today, your results will be available in MyChart, we will contact you for abnormal readings.  You have been referred to Cardiac Rehab, they will contact you to schedule  Your physician recommends that you schedule a follow-up appointment in: 2 months  Do the following things EVERYDAY: Weigh yourself in the morning before breakfast. Write it down and keep it in a log. Take your medicines as prescribed Eat low salt foods--Limit salt (sodium) to 2000 mg per day.  Stay as active as you can everyday Limit all fluids for the day to less than 2 liters  If you have any questions or concerns before your next appointment please send [place] a message through [place] or call our office at [phone].    TO LEAVE A MESSAGE FOR THE NURSE SELECT OPTION 2, PLEASE LEAVE A MESSAGE INCLUDING: YOUR NAME DATE OF BIRTH CALL BACK NUMBER REASON FOR CALL**this is important as we prioritize the call backs  YOU WILL RECEIVE A CALL BACK THE SAME DAY AS LONG AS YOU CALL BEFORE 4:00 PM  At the [facility], you and your health needs are our priority. As part of our continuing mission to provide you with exceptional heart care, we have created designated Provider Care Teams. These Care Teams include your primary Cardiologist (physician) and Advanced Practice Providers (APPs- Physician Assistants and Nurse Practitioners) who all work together to provide you with the care you need, when you need it.   You may see any of the following providers on your designated Care Team at your next follow up: Dr Glori Bickers Dr Haynes Kerns, NP Lyda Jester, [place] [facility] [place], [place] Audry Riles, PharmD   Please be sure to bring in all your medications bottles to every appointment.

## 2021-11-03 NOTE — Progress Notes (Signed)
Advanced Heart Failure Clinic Note   Primary Care:Dr. Scarlette Calico Primary Cardiologist: Dr. Aundra Dubin   HPI: Mr. Starace is a 55 y.o. male with a past medical history of NICM, EF 25-30% in March 2018, felt to be related to prior ETOH abuse. He also has a history of PE 03/2014 completed a years course of Xarelto, morbid obesity, OSA, and tobacco abuse.    He was admitted 11/12/15-11/14/15 with acute on chronic systolic CHF and palpitations. He wore a 30 day event monitor at discharge as he had frequent PVC's and questionable Afib on telemetry. Also with some NSVT, so he was started on Amiodaone, but at follow up had not started taking it. He had previously refused ICD and was seen inpatient by Dr. Rayann Heman who felt that his morbid obesity was a prohibitive factor.    He was seen in the clinic in April 2018. He had started drinking ETOH again. Volume status was stable, he is not an Entresto candidate due to angioedema with lisinopril. Weight was 411 pounds.    Admitted 04/12/17 with SOB, chest pain. D- dimer was 1.16, chest CT without central obstructing PE, however more peripheral and subsegmental pulmonary artery branches were not confidently evaluated due to his body habitus. He was started on a heparin gtt for presumed PE, however VQ scan showed no PE. Troponin was elevated, peaked at 3.37. LHC showed no CAD. Echo showed an EF of 15%, grade 2 DD, no pericarditis. He was diuresed with IV lasix, and started on torsemide 98m at discharge. Discharge weight was 405 pounds.   Admitted 5/22 through 04/24/2018 with abdominal pain, nausea, and vomiting. 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 Did not require surgical intervention. He will have follow up with GI.   Echo in 2/21 with EF 20-25%, severe LV dilation.   [place] has [place]ried [place]   Presented 05/09/21 w/ SOB 2/2 acute CHF w ? PNA.  CTA showed multifocal ?PNA, no PE. Also in Afib w/ RVR in 150s on admit. Initially required bipap, developed  hemoptysis and intubated. Treated w/ IV Lasix. Echo 05/10/21: LVEF < 20%. RV moderately reduced. Improved and was extubated 6/13. Went into Afib w/ RVR. Refused to wear BiPAP. Developed severe agitation/hypoxia and re-intubated.  Went into VT>>VF arrest overnight ACLS>>ROSC after 164m. Developed refractory AFib again next morning requiring emergent cardioversion 05/16/21. He was transitioned to PO amiodarone. HR remained in 40-50's, metoprolol and sildenafil were stopped. Discharge weight 367 lbs.  He returns today for followup of CHF and atrial fibrillation.  Weight is down 12 lbs.  No dyspnea walking on flat ground or walking up stairs, he does have bendopnea.  No lightheadedness.  No BRBPR/melena.  He is not smoking and rarely drinks. No palpitations, NSR today.   ECG (personally reviewed): NSR, LBBB, 156 msec  Labs (5/13): K 4.1, creatinine 1.05 Labs (1/14): K 3.8, creatinine 1.16, BNP 54 Labs (2/14): K 3.7, creatinine 1.11, BNP 28 Labs (2/16): K 4.1, creatinine 1.03, LDL 96, HCT 40 Labs (3/16): K 3.7, K 1.13, BNP 367 Labs (8/16): BNP 43, K 3.5, creatinine 1.01 Labs (08/13/15): K 3.7, creatinine 1.18, HCT 41.3 Labs (12/16): K 3.7, creatinine 1.14, TGs 495, digoxin < 0.2 Labs (2/17): K 3.8, creatinine 0.99, LDL 107, TGs 222 Labs (5/17): K 4, creatinine 1.47 Labs (2/18): K 3.9, creatinine 1.06, hgb 15.1, TGs 163, LDL 89, HDL 28, TSH normal  Labs (5/18): K 3.8, creatinine 1.12.  Labs (12/30/2017): K 3.8 Creatinine 1.25  Labs (01/28/2018): K 3.8  Creatinine 1.08 Labs 03/19/2018: K 4.1 Creatinine 1/15 BNP 212  Labs 04/29/3018: Creatinine 1.1  Labs (10/19): LDL 166, K 3.8, creatinine 1.01, AST 102 => 25, ALT 125 => 37, alkaline phosphatase 233 Labs (11/20): LDL 106, TGs 232 Labs (2/21): K 3.3, creatinine 1.3 Labs (4/21): LDL 114, TGs 466, K 3.6, creatinine 1.22 Labs (12/21): K 3.5, creatinine 1.08, LDL 164, TGs 133 Labs (722): K 3.8, creatinine 1.4 Labs (8/22): K 3.8, creatinine 1.23 Labs (9/22):  K 3.6, creatinine 1.26  PMH: 1. Nonischemic cardiomyopathy: Prior cath with no significant CAD.  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  Prior echoes with EF as low as 25%.  Echo (9/13) with EF 35-40%, moderate to severe LV dilation, diffuse hypokinesis, mild MR. Echo (5/15) with EF 30-35%, moderate to severe LAE, normal RV size and systolic function.  Angioedema with ACEI, headaches with hydralazine/nitrates. Echo (3/16) with EF 25-30%, severe LV dilation, normal RV size and systolic function.  [facility] 08/13/15 showed no significant coronary disease; RA mean 6, PA 33/11 mean 23, PCWP mean 13, Fick CO/CI 4.75 /1.68 (difficult study, radial artery spasm, if needs future cath would use groin).  Echo (9/16) showed EF 20-25%.   - Echo (3/18): EF 25-30%, moderate LAE - Echo 4/18 EF 15% - Echo 7/19 EF 20-25% - Echo 2/21 with EF 20-25%, severe LV dilation.  - Echo 6/22 with EF < 20%, normal RV. 2. HTN: angioedema with ACEI.  3. OSA: on Bipap 4. Morbid obesity 5. Paroxysmal atrial fibrillation: Urgent DCCV 6/22. 6. Smoker.  7. Anxiety/panic attacks 8. PE: 5/15, diagnosed by V/Q scan. CTA chest 8/16 negative for PE.  9. NSVT, PVCs: 30 day monitor (12/16) with PVCs, PACs, no atrial fibrillation.  - Zio patch (9/19): few short NSVT runs, no atrial fibrillation, 1.1% PVCs 10. Hematuria: Apparently had negative workup by urology.  11. ABIs (6/16) were normal 12. 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  13. Gout 14. Low back pain.  66. VT/VF 6/22   SH: Married, lives in [place], drinks ETOH occasionally, no drugs, no smoking.  Has son and daughter.     FH: No premature CAD.    Review of systems complete and found to be negative unless listed in HPI.   Current Outpatient Medications  Medication Sig Dispense Refill   albuterol (VENTOLIN HFA) 108 (90 Base) MCG/ACT inhaler Inhale 1-2 puffs into the lungs every 4 (four) hours as needed for shortness of breath  or wheezing.     amiodarone (PACERONE) 200 MG tablet Take 1 tablet (200 mg total) by mouth daily. Patient taking once daily 120 tablet 0   ARIPiprazole (ABILIFY) 10 MG tablet Take 1 tablet (10 mg total) by mouth daily. 90 tablet 0   atorvastatin (LIPITOR) 40 MG tablet Take 1 tablet (40 mg total) by mouth daily. 100 tablet 1   blood glucose meter kit and supplies KIT Use to check blood sugar. DX E11.9 1 each 0   buPROPion (WELLBUTRIN XL) 150 MG 24 hr tablet Take 1 tablet (150 mg total) by mouth daily. 30 tablet 1   carvedilol (COREG) 3.125 MG tablet Take 1 tablet (3.125 mg total) by mouth 2 (two) times daily. 60 tablet 6   clonazePAM (KLONOPIN) 1 MG tablet Take 1 tablet (1 mg total) by mouth 2 (two) times daily as needed for anxiety. 60 tablet 2   dapagliflozin propanediol (FARXIGA) 10 MG TABS tablet Take 1 tablet (10 mg total) by mouth daily.  90 tablet 1   losartan (COZAAR) 25 MG tablet TAKE 0.5 TABLETS BY MOUTH AT BEDTIME. 45 tablet 2   pantoprazole (PROTONIX) 40 MG tablet TAKE 1 TABLET BY MOUTH EVERY DAY 90 tablet 1   PRESCRIPTION MEDICATION Inhale into the lungs See admin instructions. Bipap, pressure 16/12 with 2L of O2 - use whenever sleeping     sertraline (ZOLOFT) 100 MG tablet Take 1.5 tablets (150 mg total) by mouth daily. 135 tablet 0   sildenafil (VIAGRA) 50 MG tablet Take 1 tablet (50 mg total) by mouth daily as needed for erectile dysfunction (begin with one tablet --may take 2 if necessary). 15 tablet 5   spironolactone (ALDACTONE) 25 MG tablet Take 1 tablet (25 mg total) by mouth daily. 90 tablet 1   thiamine 100 MG tablet Take 1 tablet (100 mg total) by mouth daily. 90 tablet 1   torsemide (DEMADEX) 20 MG tablet Take 2 tablets (40 mg total) by mouth daily. 30 tablet 3   XARELTO 20 MG TABS tablet TAKE 1 TABLET BY MOUTH EVERY DAY 90 tablet 1   potassium chloride SA (KLOR-CON) 20 MEQ tablet Take 2 tablets (40 mEq total) by mouth daily. (Patient not taking: Reported on 11/01/2021) 60  tablet 11   No current facility-administered medications for this encounter.   Allergies  Allergen Reactions   Ace Inhibitors Anaphylaxis and Swelling    Angioedema   Bidil [Isosorb Dinitrate-Hydralazine] Other (See Comments)    headache   Digoxin And Related     Unspecified "side effects"   Buspirone Other (See Comments)    dizziness   Social History   Socioeconomic History   Marital status: Married    Spouse name: Not on file   Number of children: 3   Years of education: Not on file   Highest education level: Not on file  Occupational History   Occupation: DISABLED  Tobacco Use   Smoking status: Former    Packs/day: 0.50    Years: 30.00    Pack years: 15.00    Types: Cigarettes    Quit date: 05/15/2020    Years since quitting: 1.4   Smokeless tobacco: Never   Tobacco comments:    vaping - nicotine-free products  Vaping Use   Vaping Use: Never used  Substance and Sexual Activity   Alcohol use: Not Currently    Alcohol/week: 0.0 standard drinks   Drug use: No   Sexual activity: Not Currently  Other Topics Concern   Not on file  Social History Narrative   He smokes about a pack per day and he has been smoking since he was 55 years of age.  He drinks alcohol occasionally, but he denies any illicit drug abuse.  He is presently on disability.    Lives with wife in a 2 story home.  Has 2 children.   Previously worked in [employer], last worked in 1998.   Highest level of education:  11th grade           Social Determinants of Health   Financial Resource Strain: Low Risk    Difficulty of Paying Living Expenses: Not hard at all  Food Insecurity: No Food Insecurity   Worried About Charity fundraiser in the Last Year: Never true   Ran Out of Food in the Last Year: Never true  Transportation Needs: No Transportation Needs   Lack of Transportation (Medical): No   Lack of Transportation (Non-Medical): No  Physical Activity: Sufficiently Active   Days of  Exercise per  Week: 5 days   Minutes of Exercise per Session: 30 min  Stress: No Stress Concern Present   Feeling of Stress : Not at all  Social Connections: Moderately Isolated   Frequency of Communication with Friends and Family: More than three times a week   Frequency of Social Gatherings with Friends and Family: Never   Attends Religious Services: Never   Marine scientist or Organizations: No   Attends Music therapist: Never   Marital Status: Married  Human resources officer Violence: Not on file   Family History  Problem Relation Age of Onset   Cancer Mother        brain tumor   Hypertension Mother    Diabetes Father        Deceased, 57   Heart disease Maternal Grandmother    Hypertension Other        Family History   Stroke Other        Family History   Diabetes Other        Family History   Diabetes Daughter    BP 116/88   Pulse 71   Wt (!) 169.6 kg (373 lb 12.8 oz)   SpO2 97%   BMI 55.20 kg/m   Wt Readings from Last 3 Encounters:  11/01/21 (!) 169.6 kg (373 lb 12.8 oz)  07/29/21 (!) 174.7 kg (385 lb 3.2 oz)  07/19/21 (!) 175.5 kg (387 lb)   PHYSICAL EXAM: General: NAD Neck: No JVD, no thyromegaly or thyroid nodule.  Lungs: Clear to auscultation bilaterally with normal respiratory effort. CV: Nondisplaced PMI.  Heart regular S1/S2, no S3/S4, no murmur.  No peripheral edema.  No carotid bruit.  Normal pedal pulses.  Abdomen: Soft, nontender, no hepatosplenomegaly, no distention.  Skin: Intact without lesions or rashes.  Neurologic: Alert and oriented x 3.  Psych: Normal affect. Extremities: No clubbing or cyanosis.  HEENT: Normal.   ASSESSMENT & PLAN: 1. Atrial fibrillation: Paroyxsmal. Emergent DCCV in 6/22. NSR today, no palpitations.  - Continue amiodarone 200 mg daily, check LFTs, TSH today.  Needs regular eye exam.  - Continue Xarelto. CBC today.  2.  Chronic systolic CHF: Nonischemic cardiomyopathy. ?If ETOH has played a role (now drinks rarely).  Echo (3/18) with EF 25-30%. He was seen by EP and decided against ICD given marked obesity.  QRS not wide enough for CRT. Echo 7/19 and in 2/21 with EF 20-25%. Echo 6/22 with EF < 20%, normal RV.  NYHA class II now, not volume overloaded on exam.  - Continue torsemide 40 mg daily. BMET today. - Continue spironolactone 25 mg daily. - Continue Farxiga 10 mg daily. - Continue losartan 12.5 mg at night. - Start Coreg 3.125 mg bid.  - No Entresto, ACEI with h/o angioedema.  - Headaches with Bidil, cannot take.  - He has lost some weight and now has wide LBBB, I will review with EP whether he is a candidate for CRT-D.  3. HTN: Controlled.  4. 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  Had been turned down for ICD due to size.   - Review again with EP, he has lost some weight and would likely benefit from CRT-D.  5. OSA: Continue nightly BiPAP. 6. PE: 04/14/2018, diagnosed by V/Q scan. Has been on Xarelto. 7. Anxiety/Panic attacks/depression: Followed by psychiatry. 9. Hyperlipidemia: Continue atorvastatin, check lipids next appt.  10. Obesity: I will refer to pharmacy clinic for semaglutide.   Followup with APP in 2 months.  [name] FNP 11/03/2021

## 2021-11-04 ENCOUNTER — Telehealth: Payer: Self-pay

## 2021-11-04 NOTE — Progress Notes (Signed)
Chronic Care Management Pharmacy Assistant   Name: [name]  MRN: [id number] DOB: 02/13/1966   Reason for Encounter: Disease State   Conditions to be addressed/monitored: HTN   Recent office visits:  None ID  Recent consult visits:  None ID  Hospital visits:  None in previous 6 months  Medications: Outpatient Encounter Medications as of 11/04/2021  Medication Sig Note   albuterol (VENTOLIN HFA) 108 (90 Base) MCG/ACT inhaler Inhale 1-2 puffs into the lungs every 4 (four) hours as needed for shortness of breath or wheezing.    amiodarone (PACERONE) 200 MG tablet Take 1 tablet (200 mg total) by mouth daily. Patient taking once daily    ARIPiprazole (ABILIFY) 10 MG tablet Take 1 tablet (10 mg total) by mouth daily.    atorvastatin (LIPITOR) 40 MG tablet Take 1 tablet (40 mg total) by mouth daily.    blood glucose meter kit and supplies KIT Use to check blood sugar. DX E11.9    buPROPion (WELLBUTRIN XL) 150 MG 24 hr tablet Take 1 tablet (150 mg total) by mouth daily.    carvedilol (COREG) 3.125 MG tablet Take 1 tablet (3.125 mg total) by mouth 2 (two) times daily.    clonazePAM (KLONOPIN) 1 MG tablet Take 1 tablet (1 mg total) by mouth 2 (two) times daily as needed for anxiety.    dapagliflozin propanediol (FARXIGA) 10 MG TABS tablet Take 1 tablet (10 mg total) by mouth daily.    losartan (COZAAR) 25 MG tablet TAKE 0.5 TABLETS BY MOUTH AT BEDTIME.    pantoprazole (PROTONIX) 40 MG tablet TAKE 1 TABLET BY MOUTH EVERY DAY    potassium chloride SA (KLOR-CON) 20 MEQ tablet Take 2 tablets (40 mEq total) by mouth daily. (Patient not taking: Reported on 11/01/2021) 11/01/2021: Takes as needed   Leonardo into the lungs See admin instructions. Bipap, pressure 16/12 with 2L of O2 - use whenever sleeping    sertraline (ZOLOFT) 100 MG tablet Take 1.5 tablets (150 mg total) by mouth daily.    sildenafil (VIAGRA) 50 MG tablet Take 1 tablet (50 mg total) by mouth daily  as needed for erectile dysfunction (begin with one tablet --may take 2 if necessary).    spironolactone (ALDACTONE) 25 MG tablet Take 1 tablet (25 mg total) by mouth daily.    thiamine 100 MG tablet Take 1 tablet (100 mg total) by mouth daily.    torsemide (DEMADEX) 20 MG tablet Take 2 tablets (40 mg total) by mouth daily.    XARELTO 20 MG TABS tablet TAKE 1 TABLET BY MOUTH EVERY DAY    [DISCONTINUED] pravastatin (PRAVACHOL) 40 MG tablet Take 40 mg by mouth daily.    No facility-administered encounter medications on file as of 11/04/2021.   Reviewed chart prior to disease state call. Spoke with patient regarding BP  Recent Office Vitals: BP Readings from Last 3 Encounters:  11/01/21 116/88  07/29/21 114/76  07/19/21 133/76   Pulse Readings from Last 3 Encounters:  11/01/21 71  07/29/21 63  07/19/21 68    Wt Readings from Last 3 Encounters:  11/01/21 (!) 373 lb 12.8 oz (169.6 kg)  07/29/21 (!) 385 lb 3.2 oz (174.7 kg)  07/19/21 (!) 387 lb (175.5 kg)     Kidney Function Lab Results  Component Value Date/Time   CREATININE 1.60 (H) 11/01/2021 11:44 AM   CREATININE 1.26 (H) 08/14/2021 02:47 PM   CREATININE 1.18 08/24/2017 10:59 AM   CREATININE 1.06 01/15/2017 12:11 PM  GFR 59.20 (L) 07/08/2021 11:58 AM   GFRNONAA 51 (L) 11/01/2021 11:44 AM   GFRAA >60 05/15/2020 02:52 PM    BMP Latest Ref Rng & Units 11/01/2021 08/14/2021 07/29/2021  Glucose 70 - 99 mg/dL 148(H) 82 106(H)  BUN 6 - 20 mg/dL 17 15 22(H)  Creatinine 0.61 - 1.24 mg/dL 1.60(H) 1.26(H) 1.31(H)  BUN/Creat Ratio 6 - 22 (calc) - - -  Sodium 135 - 145 mmol/L 139 138 139  Potassium 3.5 - 5.1 mmol/L 3.7 3.6 3.8  Chloride 98 - 111 mmol/L 103 109 110  CO2 22 - 32 mmol/L _0 Calcium 8.9 - 10.3 mg/dL 9.0 9.0 8.7(L)    Current antihypertensive regimen:  Losartan 25 mg Carvedilol 3.125 mg  How often are you checking your Blood Pressure? when feeling symptomatic  Current home BP readings: 120/82  What recent  interventions/DTPs have been made by any provider to improve Blood Pressure control since last CPP Visit: none noted  Any recent hospitalizations or ED visits since last visit with CPP? No  What diet changes have been made to improve Blood Pressure Control?  Patient states that he has cut back on sodium when cooking What exercise is being done to improve your Blood Pressure Control?  Patient states that he is getting out more to walk  Adherence Review: Is the patient currently on ACE/ARB medication? Yes Does the patient have >5 day gap between last estimated fill dates? No    Care Gaps: Colonoscopy-NA Diabetic Foot Exam-11/19/20 Ophthalmology-11/19/20 Dexa Scan - NA Annual Well Visit - NA Micro albumin-NA Hemoglobin A1c- 05/10/21  Star Rating Drugs: Losartan 25 mg-last fill 09/05/21 90 ds Atorvastatin 40 mg-last fill 08/12/21 90 ds  [name] Clinical Pharmacist Assistant [phone]
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
## 2021-11-08 ENCOUNTER — Other Ambulatory Visit: Payer: Self-pay | Admitting: Psychiatry

## 2021-11-12 ENCOUNTER — Other Ambulatory Visit: Payer: Self-pay

## 2021-11-12 ENCOUNTER — Encounter: Payer: Self-pay | Admitting: Psychiatry

## 2021-11-12 ENCOUNTER — Telehealth (INDEPENDENT_AMBULATORY_CARE_PROVIDER_SITE_OTHER): Payer: Medicare HMO | Admitting: Psychiatry

## 2021-11-12 DIAGNOSIS — F3132 Bipolar disorder, current episode depressed, moderate: Secondary | ICD-10-CM

## 2021-11-12 DIAGNOSIS — R69 Illness, unspecified: Secondary | ICD-10-CM | POA: Diagnosis not present

## 2021-11-12 DIAGNOSIS — F1010 Alcohol abuse, uncomplicated: Secondary | ICD-10-CM

## 2021-11-12 DIAGNOSIS — F4310 Post-traumatic stress disorder, unspecified: Secondary | ICD-10-CM | POA: Diagnosis not present

## 2021-11-12 MED ORDER — SERTRALINE HCL 100 MG PO TABS: 150.0000 mg | ORAL_TABLET | Freq: Every day | ORAL | 1 refills | Status: DC

## 2021-11-12 MED ORDER — BUPROPION HCL ER (XL) 150 MG PO TB24
150.0000 mg | ORAL_TABLET | Freq: Every day | ORAL | 0 refills | Status: DC
Start: 2021-11-24 — End: 2022-02-20

## 2021-11-12 MED ORDER — ARIPIPRAZOLE 10 MG PO TABS: 10.0000 mg | ORAL_TABLET | Freq: Every day | ORAL | 0 refills | Status: DC

## 2021-11-12 NOTE — Patient Instructions (Signed)
Continue sertraline 150 mg daily  Continue bupropion 150 mg daily Continue Abilify 10 mg daily Continue clonazepam 1 mg twice a day as needed for anxiety  Next appointment- 2/20 at 10 AM

## 2021-11-22 ENCOUNTER — Ambulatory Visit: Payer: Medicare HMO | Admitting: Licensed Clinical Social Worker

## 2021-11-22 ENCOUNTER — Other Ambulatory Visit: Payer: Self-pay

## 2021-11-28 ENCOUNTER — Ambulatory Visit (INDEPENDENT_AMBULATORY_CARE_PROVIDER_SITE_OTHER): Payer: Medicare HMO | Admitting: Cardiology

## 2021-11-28 ENCOUNTER — Other Ambulatory Visit: Payer: Self-pay

## 2021-11-28 ENCOUNTER — Encounter: Payer: Self-pay | Admitting: Cardiology

## 2021-11-28 VITALS — BP 110/64 | HR 70 | Ht 69.0 in | Wt 370.8 lb

## 2021-11-28 DIAGNOSIS — Z8679 Personal history of other diseases of the circulatory system: Secondary | ICD-10-CM | POA: Diagnosis not present

## 2021-11-28 DIAGNOSIS — I5042 Chronic combined systolic (congestive) and diastolic (congestive) heart failure: Secondary | ICD-10-CM | POA: Diagnosis not present

## 2021-11-28 DIAGNOSIS — G4733 Obstructive sleep apnea (adult) (pediatric): Secondary | ICD-10-CM

## 2021-11-28 DIAGNOSIS — I480 Paroxysmal atrial fibrillation: Secondary | ICD-10-CM

## 2021-11-28 NOTE — Patient Instructions (Addendum)
Medication Instructions:  Your physician recommends that you continue on your current medications as directed. Please refer to the Current Medication list given to you today. *If you need a refill on your cardiac medications before your next appointment, please call your pharmacy*  Lab Work: None ordered. If you have labs (blood work) drawn today and your tests are completely normal, you will receive your results only by: [place] (if you have MyChart) OR A paper copy in the mail If you have any lab test that is abnormal or we need to change your treatment, we will call you to review the results.  Testing/Procedures: None ordered.  Follow-Up: At [facility], you and your health needs are our priority.  As part of our continuing mission to provide you with exceptional heart care, we have created designated Provider Care Teams.  These Care Teams include your primary Cardiologist (physician) and Advanced Practice Providers (APPs -  Physician Assistants and Nurse Practitioners) who all work together to provide you with the care you need, when you need it.  Your next appointment:    SEE INSTRUCTION LETTER

## 2021-11-28 NOTE — Progress Notes (Signed)
Electrophysiology Office Note:    Date:  11/28/2021   ID:  [name], DOB [birth year], MRN [id number]  PCP:  [name], MD  [facility] Cardiologist:  Fransico Him, MD  [facility] Electrophysiologist:  None   Referring MD: [name], MD   Chief Complaint: Chronic systolic heart failure  History of Present Illness:    [name] is a 55 y.o. male who presents for an evaluation of chronic systolic heart failure at the request of Dr. Aundra Dubin. Their medical history includes chronic systolic heart failure secondary to nonischemic cardiomyopathy, prior alcohol abuse, tobacco abuse, history of pulmonary embolism, obstructive sleep apnea, morbid obesity.  He is here today with his family.  He last saw Dr. Aundra Dubin on November 01, 2021.  NYHA class II-III.  He tells me he is working on improving his exercise capacity.  He walks on a treadmill daily, at least 15 minutes but sometimes has to stop because of shortness of breath.  Sometimes he has some dizziness spells but no frank syncope.  He is working on losing weight.  He is lost over 70 pounds and is planning on losing more.  He is achieving this by eating less.  He had a complicated admission in June 2022 with shortness of breath and acute heart failure.  He ultimately was intubated.  RV function was moderately reduced.  He had an episode of atrial fibrillation with rapid ventricular rates and had flash pulmonary edema.  He declined BiPAP and had respiratory distress and hypoxia.  He ultimately had a VT/VF arrest and had ROSC after 1 minute.  He does not drink alcohol anymore.  He is off of cigarettes and now vapes but he is trying to wean off of this as well.  He is working on improving his exercise capacity and is actively losing weight.    Past Medical History:  Diagnosis Date   Alcohol abuse    Anxiety state, unspecified    Atrial fibrillation (HCC)    CHF (congestive heart failure) (HCC)    Chronic systolic heart  failure (HCC)    Diabetes mellitus, type II (HCC)    Edema    Gout    History of medication noncompliance    Migraine    Obesity, unspecified    Obstructive sleep apnea    Psychiatric disorder    Pulmonary embolism (HCC)    Shortness of breath     Past Surgical History:  Procedure Laterality Date   CARDIAC CATHETERIZATION     CARDIAC CATHETERIZATION N/A 08/13/2015   Procedure: Right/Left Heart Cath and Coronary Angiography;  Surgeon: [name], MD;  Location: [facility];  Service: Cardiovascular;  Laterality: N/A;   RIGHT/LEFT HEART CATH AND CORONARY ANGIOGRAPHY N/A 04/14/2017   Procedure: Right/Left Heart Cath and Coronary Angiography;  Surgeon: [name], MD;  Location: [facility];  Service: Cardiovascular;  Laterality: N/A;   TESTICLE SURGERY      Current Medications: Current Meds  Medication Sig   albuterol (VENTOLIN HFA) 108 (90 Base) MCG/ACT inhaler Inhale 1-2 puffs into the lungs every 4 (four) hours as needed for shortness of breath or wheezing.   amiodarone (PACERONE) 200 MG tablet Take 1 tablet (200 mg total) by mouth daily. Patient taking once daily   ARIPiprazole (ABILIFY) 10 MG tablet Take 1 tablet (10 mg total) by mouth daily.   atorvastatin (LIPITOR) 40 MG tablet Take 1 tablet (40 mg total) by mouth daily.  blood glucose meter kit and supplies KIT Use to check blood sugar. DX E11.9   buPROPion (WELLBUTRIN XL) 150 MG 24 hr tablet Take 1 tablet (150 mg total) by mouth daily.   carvedilol (COREG) 3.125 MG tablet Take 1 tablet (3.125 mg total) by mouth 2 (two) times daily.   clonazePAM (KLONOPIN) 1 MG tablet Take 1 tablet (1 mg total) by mouth 2 (two) times daily as needed for anxiety.   dapagliflozin propanediol (FARXIGA) 10 MG TABS tablet Take 1 tablet (10 mg total) by mouth daily.   losartan (COZAAR) 25 MG tablet TAKE 0.5 TABLETS BY MOUTH AT BEDTIME.   pantoprazole (PROTONIX) 40 MG tablet TAKE 1 TABLET BY MOUTH EVERY DAY   potassium  chloride SA (KLOR-CON) 20 MEQ tablet Take 2 tablets (40 mEq total) by mouth daily. (Patient taking differently: Take 40 mEq by mouth as needed (muscle cramps).)   PRESCRIPTION MEDICATION Inhale into the lungs See admin instructions. Bipap, pressure 16/12 with 2L of O2 - use whenever sleeping   sertraline (ZOLOFT) 100 MG tablet Take 1.5 tablets (150 mg total) by mouth daily.   sildenafil (VIAGRA) 50 MG tablet Take 1 tablet (50 mg total) by mouth daily as needed for erectile dysfunction (begin with one tablet --may take 2 if necessary).   spironolactone (ALDACTONE) 25 MG tablet Take 1 tablet (25 mg total) by mouth daily.   thiamine 100 MG tablet Take 1 tablet (100 mg total) by mouth daily.   torsemide (DEMADEX) 20 MG tablet Take 2 tablets (40 mg total) by mouth daily.   XARELTO 20 MG TABS tablet TAKE 1 TABLET BY MOUTH EVERY DAY     Allergies:   Ace inhibitors, Bidil [isosorb dinitrate-hydralazine], Digoxin and related, and Buspirone   Social History   Socioeconomic History   Marital status: Married    Spouse name: Not on file   Number of children: 3   Years of education: Not on file   Highest education level: Not on file  Occupational History   Occupation: DISABLED  Tobacco Use   Smoking status: Former    Packs/day: 0.50    Years: 30.00    Pack years: 15.00    Types: Cigarettes    Quit date: 05/15/2020    Years since quitting: 1.5   Smokeless tobacco: Never   Tobacco comments:    vaping - nicotine-free products  Vaping Use   Vaping Use: Never used  Substance and Sexual Activity   Alcohol use: Not Currently    Alcohol/week: 0.0 standard drinks   Drug use: No   Sexual activity: Not Currently  Other Topics Concern   Not on file  Social History Narrative   He smokes about a pack per day and he has been smoking since he was 55 years of age.  He drinks alcohol occasionally, but he denies any illicit drug abuse.  He is presently on disability.    Lives with wife in a 2 story home.   Has 2 children.   Previously worked in [employer], last worked in 1998.   Highest level of education:  11th grade           Social Determinants of Health   Financial Resource Strain: Low Risk    Difficulty of Paying Living Expenses: Not hard at all  Food Insecurity: No Food Insecurity   Worried About Charity fundraiser in the Last Year: Never true   Ran Out of Food in the Last Year: Never true  Transportation Needs: No  Transportation Needs   Lack of Transportation (Medical): No   Lack of Transportation (Non-Medical): No  Physical Activity: Sufficiently Active   Days of Exercise per Week: 5 days   Minutes of Exercise per Session: 30 min  Stress: No Stress Concern Present   Feeling of Stress : Not at all  Social Connections: Moderately Isolated   Frequency of Communication with Friends and Family: More than three times a week   Frequency of Social Gatherings with Friends and Family: Never   Attends Religious Services: Never   Marine scientist or Organizations: No   Attends Music therapist: Never   Marital Status: Married     Family History: The patient's family history includes Cancer in his mother; Diabetes in his daughter, father, and another family member; Heart disease in his maternal grandmother; Hypertension in his mother and another family member; Stroke in an other family member.  ROS:   Please see the history of present illness.    All other systems reviewed and are negative.  EKGs/Labs/Other Studies Reviewed:    The following studies were reviewed today:  Heart failure clinic records  May 16, 2021 transesophageal echo No LV thrombus LV function less than 20% RV function mildly reduced Dilated left atrium Mild MR  EKG:  The ekg ordered today demonstrates left bundle branch block   Recent Labs: 05/19/2021: Magnesium 2.0 07/08/2021: Pro B Natriuretic peptide (BNP) 142.0 07/19/2021: B Natriuretic Peptide 98.3 11/01/2021: ALT 25; BUN 17;  Creatinine, Ser 1.60; Hemoglobin 16.6; Platelets 227; Potassium 3.7; Sodium 139; TSH 2.183  Recent Lipid Panel    Component Value Date/Time   CHOL 157 02/22/2021 1211   TRIG 221 (H) 05/14/2021 0226   HDL 37 (L) 02/22/2021 1211   CHOLHDL 4.2 02/22/2021 1211   VLDL 19 02/22/2021 1211   LDLCALC 101 (H) 02/22/2021 1211   LDLDIRECT 114.0 03/21/2020 1455    Physical Exam:    VS:  BP 110/64    Pulse 70    Ht _0  (1.753 m)    Wt (!) 370 lb 12.8 oz (168.2 kg)    SpO2 97%    BMI 54.76 kg/m     Wt Readings from Last 3 Encounters:  11/28/21 (!) 370 lb 12.8 oz (168.2 kg)  11/01/21 (!) 373 lb 12.8 oz (169.6 kg)  07/29/21 (!) 385 lb 3.2 oz (174.7 kg)     GEN:  Well nourished, well developed in no acute distress.  Morbidly obese HEENT: Normal NECK: No JVD; No carotid bruits LYMPHATICS: No lymphadenopathy CARDIAC: RRR, no murmurs, rubs, gallops RESPIRATORY:  Clear to auscultation without rales, wheezing or rhonchi  ABDOMEN: Soft, non-tender, non-distended MUSCULOSKELETAL:  No edema; No deformity  SKIN: Warm and dry NEUROLOGIC:  Alert and oriented x 3 PSYCHIATRIC:  Normal affect       ASSESSMENT:    1. Chronic combined systolic and diastolic CHF (congestive heart failure) (HCC)   2. AF (paroxysmal atrial fibrillation) ([place])   3. OSA treated with BiPAP   4. History of ventricular tachycardia    PLAN:    In order of problems listed above:  #Chronic combined systolic and diastolic heart failure NYHA class II-III.  Warm and relatively euvolemic on exam today.  On good medical therapy with Coreg, Farxiga, Cozaar, spironolactone and torsemide. The patient has a left bundle branch block and persistently reduced left ventricular function.  I do think he would overall benefit from resynchronization therapy with ICD.  He also has a history of  VT/VF arrest in the setting of hypoxia.    I discussed the CRT-D implant procedure in detail during today's visit.  I discussed the risks, potential  benefits and recovery.  Given his body mass and history of difficulty with anesthesia, we will plan to perform the procedure with our anesthesia colleagues support.  I will defer to them whether or not they would like to do general anesthesia or MAC.  If they think it would be lower risk, I am okay with doing the case under MAC sedation.  May need arterial blood pressure monitoring during the case.  We will plan for a [facility] CRT-D.  The patient has an nonischemic CM (EF <20%), NYHA Class III CHF, and CAD.  He is referred by [name] for risk stratification of sudden death and consideration of ICD implantation.  At this time, he meets criteria for ICD implantation for primary prevention of sudden death.  I have had a thorough discussion with the patient reviewing options.  The patient and their family (if available) have had opportunities to ask questions and have them answered. The patient and I have decided together through a shared decision making process to proceed with ICD implant at this time.    Risks, benefits, alternatives to ICD implantation were discussed in detail with the patient today. The patient understands that the risks include but are not limited to bleeding, infection, pneumothorax, perforation, tamponade, vascular damage, renal failure, MI, stroke, death, inappropriate shocks, and lead dislodgement and wishes to proceed.  We will therefore schedule device implantation at the next available time.  We did discuss how his significantly reduced left ventricular function puts him at an increased risk of complication.   #Paroxysmal atrial fibrillation In sinus rhythm today.  Will need to hold his Xarelto for 3 days prior to the implant.  #Obstructive sleep apnea on BiPAP Encouraged continued BiPAP use  #History of ventricular tachycardia Has a primary prevention indication for ICD therapy despite his history of VT/VF in the setting of hypoxia.  I do think his episode of VT/VF while  hospitalized was due to a seemingly reversible cause.       Total time spent with patient today 50 minutes. This includes reviewing records, evaluating the patient and coordinating care.  Medication Adjustments/Labs and Tests Ordered: Current medicines are reviewed at length with the patient today.  Concerns regarding medicines are outlined above.  No orders of the defined types were placed in this encounter.  No orders of the defined types were placed in this encounter.    Signed, Hilton Cork. Quentin Ore, MD, [facility], [facility] 11/28/2021 7:48 PM    Electrophysiology [facility]

## 2021-11-29 DIAGNOSIS — G4733 Obstructive sleep apnea (adult) (pediatric): Secondary | ICD-10-CM | POA: Diagnosis not present

## 2021-12-04 ENCOUNTER — Telehealth: Payer: Self-pay

## 2021-12-04 NOTE — Progress Notes (Signed)
Chronic Care Management Pharmacy Assistant   Name: [name]  MRN: [id number] DOB: 15-Jan-1966   Reason for Encounter: Disease State   Conditions to be addressed/monitored: HTN   Recent office visits:  None ID  Recent consult visits:  11/28/21 Lars Mage T, MD-Cardiology (Chronic combined systolic and diastolic CHF) No orders or med changes  Hospital visits:  None in previous 6 months  Medications: Outpatient Encounter Medications as of 12/04/2021  Medication Sig   albuterol (VENTOLIN HFA) 108 (90 Base) MCG/ACT inhaler Inhale 1-2 puffs into the lungs every 4 (four) hours as needed for shortness of breath or wheezing.   amiodarone (PACERONE) 200 MG tablet Take 1 tablet (200 mg total) by mouth daily. Patient taking once daily   ARIPiprazole (ABILIFY) 10 MG tablet Take 1 tablet (10 mg total) by mouth daily.   atorvastatin (LIPITOR) 40 MG tablet Take 1 tablet (40 mg total) by mouth daily.   blood glucose meter kit and supplies KIT Use to check blood sugar. DX E11.9   buPROPion (WELLBUTRIN XL) 150 MG 24 hr tablet Take 1 tablet (150 mg total) by mouth daily.   carvedilol (COREG) 3.125 MG tablet Take 1 tablet (3.125 mg total) by mouth 2 (two) times daily.   clonazePAM (KLONOPIN) 1 MG tablet Take 1 tablet (1 mg total) by mouth 2 (two) times daily as needed for anxiety.   dapagliflozin propanediol (FARXIGA) 10 MG TABS tablet Take 1 tablet (10 mg total) by mouth daily.   losartan (COZAAR) 25 MG tablet TAKE 0.5 TABLETS BY MOUTH AT BEDTIME.   pantoprazole (PROTONIX) 40 MG tablet TAKE 1 TABLET BY MOUTH EVERY DAY   potassium chloride SA (KLOR-CON) 20 MEQ tablet Take 2 tablets (40 mEq total) by mouth daily. (Patient taking differently: Take 40 mEq by mouth as needed (muscle cramps).)   PRESCRIPTION MEDICATION Inhale into the lungs See admin instructions. Bipap, pressure 16/12 with 2L of O2 - use whenever sleeping   sertraline (ZOLOFT) 100 MG tablet Take 1.5 tablets (150 mg total)  by mouth daily.   sildenafil (VIAGRA) 50 MG tablet Take 1 tablet (50 mg total) by mouth daily as needed for erectile dysfunction (begin with one tablet --may take 2 if necessary).   spironolactone (ALDACTONE) 25 MG tablet Take 1 tablet (25 mg total) by mouth daily.   thiamine 100 MG tablet Take 1 tablet (100 mg total) by mouth daily.   torsemide (DEMADEX) 20 MG tablet Take 2 tablets (40 mg total) by mouth daily.   XARELTO 20 MG TABS tablet TAKE 1 TABLET BY MOUTH EVERY DAY   [DISCONTINUED] pravastatin (PRAVACHOL) 40 MG tablet Take 40 mg by mouth daily.   No facility-administered encounter medications on file as of 12/04/2021.   Reviewed chart prior to disease state call. Spoke with patient regarding BP  Recent Office Vitals: BP Readings from Last 3 Encounters:  11/28/21 110/64  11/01/21 116/88  07/29/21 114/76   Pulse Readings from Last 3 Encounters:  11/28/21 70  11/01/21 71  07/29/21 63    Wt Readings from Last 3 Encounters:  11/28/21 (!) 370 lb 12.8 oz (168.2 kg)  11/01/21 (!) 373 lb 12.8 oz (169.6 kg)  07/29/21 (!) 385 lb 3.2 oz (174.7 kg)     Kidney Function Lab Results  Component Value Date/Time   CREATININE 1.60 (H) 11/01/2021 11:44 AM   CREATININE 1.26 (H) 08/14/2021 02:47 PM   CREATININE 1.18 08/24/2017 10:59 AM   CREATININE 1.06 01/15/2017 12:11 PM   GFR  59.20 (L) 07/08/2021 11:58 AM   GFRNONAA 51 (L) 11/01/2021 11:44 AM   GFRAA >60 05/15/2020 02:52 PM    BMP Latest Ref Rng & Units 11/01/2021 08/14/2021 07/29/2021  Glucose 70 - 99 mg/dL 148(H) 82 106(H)  BUN 6 - 20 mg/dL 17 15 22(H)  Creatinine 0.61 - 1.24 mg/dL 1.60(H) 1.26(H) 1.31(H)  BUN/Creat Ratio 6 - 22 (calc) - - -  Sodium 135 - 145 mmol/L 139 138 139  Potassium 3.5 - 5.1 mmol/L 3.7 3.6 3.8  Chloride 98 - 111 mmol/L 103 109 110  CO2 22 - 32 mmol/L 25 22 24   Calcium 8.9 - 10.3 mg/dL 9.0 9.0 8.7(L)    Current antihypertensive regimen:  Losartan 25 mg Carvedilol 3.125 mg  How often are you checking  your Blood Pressure? when feeling symptomatic  Current home BP readings: 110/72 yesterday morning  What recent interventions/DTPs have been made by any provider to improve Blood Pressure control since last CPP Visit: Patient stated that next month he will be getting a ICD, per Dr. Quentin Ore  Any recent hospitalizations or ED visits since last visit with CPP? No  What diet changes have been made to improve Blood Pressure Control?  Patient stated he has not made any changes, just eats less  What exercise is being done to improve your Blood Pressure Control?  Patient stated he is trying to get better with exercising. He sometimes uses his treadmill to walk  Adherence Review: Is the patient currently on ACE/ARB medication? Yes Does the patient have >5 day gap between last estimated fill dates? No    Care Gaps: Colonoscopy-NA Diabetic Foot Exam-11/19/20 Ophthalmology-11/19/20 Dexa Scan - NA Annual Well Visit - 01/15/21 Micro albumin-NA Hemoglobin A1c- 05/10/21  Star Rating Drugs: Losartan 25 mg-last fill 09/05/21 90 ds Atorvastatin 40 mg-last fill 11/08/21 90 ds   [name] Clinical Pharmacist Assistant [phone]

## 2021-12-30 ENCOUNTER — Telehealth: Payer: Self-pay

## 2021-12-30 DIAGNOSIS — G4733 Obstructive sleep apnea (adult) (pediatric): Secondary | ICD-10-CM | POA: Diagnosis not present

## 2021-12-30 NOTE — Telephone Encounter (Signed)
Pt calling in requesting a refill on: clonazePAM (KLONOPIN) 1 MG tablet.  Pharmacy: [id number] [address], [place] [address]  LOV 07/08/21  Pt scheduled F/U for Dr. Ronnald Ramp first available 3/16. Pt will be having surgery on 2/13

## 2021-12-31 NOTE — Telephone Encounter (Signed)
Pt calling to check the status of the refill on: clonazePAM (KLONOPIN) 1 MG tablet.

## 2022-01-02 ENCOUNTER — Telehealth: Payer: Self-pay

## 2022-01-02 ENCOUNTER — Other Ambulatory Visit: Payer: Self-pay | Admitting: Psychiatry

## 2022-01-02 DIAGNOSIS — F410 Panic disorder [episodic paroxysmal anxiety] without agoraphobia: Secondary | ICD-10-CM

## 2022-01-02 MED ORDER — CLONAZEPAM 1 MG PO TABS: 1.0000 mg | ORAL_TABLET | Freq: Two times a day (BID) | ORAL | 0 refills | Status: DC | PRN

## 2022-01-02 NOTE — Chronic Care Management (AMB) (Signed)
Chronic Care Management Pharmacy Assistant   Name: [name]  MRN: [id number] DOB: 23-May-1966   Reason for Encounter: Disease State   Conditions to be addressed/monitored: HTN   Recent office visits:  None ID  Recent consult visits:  None ID  Hospital visits:  None in previous 6 months  Medications: Outpatient Encounter Medications as of 01/02/2022  Medication Sig   albuterol (VENTOLIN HFA) 108 (90 Base) MCG/ACT inhaler Inhale 1-2 puffs into the lungs every 4 (four) hours as needed for shortness of breath or wheezing.   amiodarone (PACERONE) 200 MG tablet Take 1 tablet (200 mg total) by mouth daily. Patient taking once daily   ARIPiprazole (ABILIFY) 10 MG tablet Take 1 tablet (10 mg total) by mouth daily.   atorvastatin (LIPITOR) 40 MG tablet Take 1 tablet (40 mg total) by mouth daily.   blood glucose meter kit and supplies KIT Use to check blood sugar. DX E11.9   buPROPion (WELLBUTRIN XL) 150 MG 24 hr tablet Take 1 tablet (150 mg total) by mouth daily.   carvedilol (COREG) 3.125 MG tablet Take 1 tablet (3.125 mg total) by mouth 2 (two) times daily.   clonazePAM (KLONOPIN) 1 MG tablet Take 1 tablet (1 mg total) by mouth 2 (two) times daily as needed for anxiety.   dapagliflozin propanediol (FARXIGA) 10 MG TABS tablet Take 1 tablet (10 mg total) by mouth daily.   losartan (COZAAR) 25 MG tablet TAKE 0.5 TABLETS BY MOUTH AT BEDTIME.   pantoprazole (PROTONIX) 40 MG tablet TAKE 1 TABLET BY MOUTH EVERY DAY   potassium chloride SA (KLOR-CON) 20 MEQ tablet Take 2 tablets (40 mEq total) by mouth daily. (Patient taking differently: Take 40 mEq by mouth as needed (muscle cramps).)   PRESCRIPTION MEDICATION Inhale into the lungs See admin instructions. Bipap, pressure 16/12 with 2L of O2 - use whenever sleeping   sertraline (ZOLOFT) 100 MG tablet Take 1.5 tablets (150 mg total) by mouth daily.   sildenafil (VIAGRA) 50 MG tablet Take 1 tablet (50 mg total) by mouth daily as needed  for erectile dysfunction (begin with one tablet --may take 2 if necessary).   spironolactone (ALDACTONE) 25 MG tablet Take 1 tablet (25 mg total) by mouth daily.   thiamine 100 MG tablet Take 1 tablet (100 mg total) by mouth daily.   torsemide (DEMADEX) 20 MG tablet Take 2 tablets (40 mg total) by mouth daily.   XARELTO 20 MG TABS tablet TAKE 1 TABLET BY MOUTH EVERY DAY   [DISCONTINUED] pravastatin (PRAVACHOL) 40 MG tablet Take 40 mg by mouth daily.   No facility-administered encounter medications on file as of 01/02/2022.   Reviewed chart prior to disease state call. Spoke with patient regarding BP  Recent Office Vitals: BP Readings from Last 3 Encounters:  11/28/21 110/64  11/01/21 116/88  07/29/21 114/76   Pulse Readings from Last 3 Encounters:  11/28/21 70  11/01/21 71  07/29/21 63    Wt Readings from Last 3 Encounters:  11/28/21 (!) 370 lb 12.8 oz (168.2 kg)  11/01/21 (!) 373 lb 12.8 oz (169.6 kg)  07/29/21 (!) 385 lb 3.2 oz (174.7 kg)     Kidney Function Lab Results  Component Value Date/Time   CREATININE 1.60 (H) 11/01/2021 11:44 AM   CREATININE 1.26 (H) 08/14/2021 02:47 PM   CREATININE 1.18 08/24/2017 10:59 AM   CREATININE 1.06 01/15/2017 12:11 PM   GFR 59.20 (L) 07/08/2021 11:58 AM   GFRNONAA 51 (L) 11/01/2021 11:44 AM  GFRAA >60 05/15/2020 02:52 PM    BMP Latest Ref Rng & Units 11/01/2021 08/14/2021 07/29/2021  Glucose 70 - 99 mg/dL 148(H) 82 106(H)  BUN 6 - 20 mg/dL 17 15 22(H)  Creatinine 0.61 - 1.24 mg/dL 1.60(H) 1.26(H) 1.31(H)  BUN/Creat Ratio 6 - 22 (calc) - - -  Sodium 135 - 145 mmol/L 139 138 139  Potassium 3.5 - 5.1 mmol/L 3.7 3.6 3.8  Chloride 98 - 111 mmol/L 103 109 110  CO2 22 - 32 mmol/L _0 Calcium 8.9 - 10.3 mg/dL 9.0 9.0 8.7(L)    Current antihypertensive regimen:  Losartan 25 mg Carvedilol 3.125 mg  How often are you checking your Blood Pressure?  Patient just checks blood pressure when he feels like he is dizzy or  lightheaded  Current home BP readings: Patient does not have a recent home reading, but last bp reading was 110/61 on 11/28/21  What recent interventions/DTPs have been made by any provider to improve Blood Pressure control since last CPP Visit: None noted  Any recent hospitalizations or ED visits since last visit with CPP? No  What diet changes have been made to improve Blood Pressure Control?  Patient states that he does not add salt to his foods, eating more vegetables and fruits  What exercise is being done to improve your Blood Pressure Control?  Patient states the uses his treadmill,walks about 3 times a week  Adherence Review: Is the patient currently on ACE/ARB medication? Yes Does the patient have >5 day gap between last estimated fill dates? No   Care Gaps: Colonoscopy-NA Diabetic Foot Exam-11/19/20 Ophthalmology-11/19/20 Dexa Scan - NA Annual Well Visit - 01/15/21 Micro albumin-NA Hemoglobin A1c- 05/10/21  Star Rating Drugs: Losartan 25 mg-last fill 09/05/21 90 ds Atorvastatin 40 mg-last fill 11/08/21 90 ds  [name] Clinical Pharmacist Assistant [phone]

## 2022-01-02 NOTE — Telephone Encounter (Signed)
Pt has been informed he would need an OV to discuss. He stated that he would check his schedule and give the office a call back.

## 2022-01-02 NOTE — Telephone Encounter (Signed)
pt called states he needs a refill on the klonopin

## 2022-01-02 NOTE — Telephone Encounter (Signed)
Pt checking status of refill request, informed pt provider is not the prescribing provider  Pt states the psychiatrist that prescribed the medication retired and he does not know the name of his current psychiatrist   Pt requesting a c/b for recommendations on how to get the rx filled or an alternative medication  *see below*

## 2022-01-02 NOTE — Telephone Encounter (Signed)
Ordered

## 2022-01-06 ENCOUNTER — Telehealth (HOSPITAL_COMMUNITY): Payer: Self-pay

## 2022-01-06 NOTE — Telephone Encounter (Signed)
Called to confirm/remind patient of their appointment at the [facility] on 01/07/22.   Patient reminded to bring all medications and/or complete list.  Confirmed patient has transportation. Gave directions, instructed to utilize [place] parking.  Confirmed appointment prior to ending call.

## 2022-01-06 NOTE — Progress Notes (Signed)
Advanced Heart Failure Clinic Note   Primary Care:Dr. Scarlette Calico Primary Cardiologist: Dr. Aundra Dubin   HPI: [name] is a 56 y.o. male with a past medical history of NICM, EF 25-30% in March 2018, felt to be related to prior ETOH abuse. He also has a history of PE 03/2014 completed a years course of Xarelto, morbid obesity, OSA, and tobacco abuse.    He was admitted 11/12/15-11/14/15 with acute on chronic systolic CHF and palpitations. He wore a 30 day event monitor at discharge as he had frequent PVC's and questionable Afib on telemetry. Also with some NSVT, so he was started on Amiodaone, but at follow up had not started taking it. He had previously refused ICD and was seen inpatient by Dr. Rayann Heman who felt that his morbid obesity was a prohibitive factor.    He was seen in the clinic in April 2018. He had started drinking ETOH again. Volume status was stable, he is not an Entresto candidate due to angioedema with lisinopril. Weight was 411 pounds.    Admitted 04/12/17 with SOB, chest pain. D- dimer was 1.16, chest CT without central obstructing PE, however more peripheral and subsegmental pulmonary artery branches were not confidently evaluated due to his body habitus. He was started on a heparin gtt for presumed PE, however VQ scan showed no PE. Troponin was elevated, peaked at 3.37. LHC showed no CAD. Echo showed an EF of 15%, grade 2 DD, no pericarditis. He was diuresed with IV lasix, and started on torsemide 20 mg at discharge. Discharge weight was 405 pounds.   Admitted 5/22 through 04/24/2018 with abdominal pain, nausea, and vomiting. 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 Did not require surgical intervention. He will have follow up with GI.   Echo in 2/21 with EF 20-25%, severe LV dilation.   He has remarried and now lives in [place].   Presented 05/09/21 w/ SOB 2/2 acute CHF w ? PNA.  CTA showed multifocal ?PNA, no PE. Also in Afib w/ RVR in 150s on admit. Initially required bipap, developed  hemoptysis and intubated. Treated w/ IV Lasix. Echo 05/10/21: LVEF < 20%. RV moderately reduced. Improved and was extubated 6/13. Went into Afib w/ RVR. Refused to wear BiPAP. Developed severe agitation/hypoxia and re-intubated.  Went into VT>>VF arrest overnight ACLS>>ROSC after 68mn. Developed refractory AFib again next morning requiring emergent cardioversion 05/16/21. He was transitioned to PO amiodarone. HR remained in 40-50's, metoprolol and sildenafil were stopped. Discharge weight 367 lbs.  Today he returns for HF follow up with his wife. Overall feeling fine. Main complaint is anxiety surrounding upcoming ICD placement. He continues with + bendopnea but no issues walking on flat ground. Denies abnormal bleeding, palpitaitons, CP, dizziness, edema, or PND/Orthopnea. Appetite ok. No fever or chills. He does not have a scale at home. Taking all medications. Wearing BiPap nightly. Drinking a lot of fluid the last few days. Rare ETOH.  ECG (personally reviewed): none ordered today.  Labs (5/13): K 4.1, creatinine 1.05 Labs (1/14): K 3.8, creatinine 1.16, BNP 54 Labs (2/14): K 3.7, creatinine 1.11, BNP 28 Labs (2/16): K 4.1, creatinine 1.03, LDL 96, HCT 40 Labs (3/16): K 3.7, K 1.13, BNP 367 Labs (8/16): BNP 43, K 3.5, creatinine 1.01 Labs (08/13/15): K 3.7, creatinine 1.18, HCT 41.3 Labs (12/16): K 3.7, creatinine 1.14, TGs 495, digoxin < 0.2 Labs (2/17): K 3.8, creatinine 0.99, LDL 107, TGs 222 Labs (5/17): K 4, creatinine 1.47 Labs (2/18): K 3.9, creatinine 1.06, hgb 15.1, TGs 163, LDL 89,  HDL 28, TSH normal  Labs (5/18): K 3.8, creatinine 1.12.  Labs (12/30/2017): K 3.8 Creatinine 1.25  Labs (01/28/2018): K 3.8 Creatinine 1.08 Labs 03/19/2018: K 4.1 Creatinine 1/15 BNP 212  Labs 04/29/3018: Creatinine 1.1  Labs (10/19): LDL 166, K 3.8, creatinine 1.01, AST 102 => 25, ALT 125 => 37, alkaline phosphatase 233 Labs (11/20): LDL 106, TGs 232 Labs (2/21): K 3.3, creatinine 1.3 Labs (4/21): LDL  114, TGs 466, K 3.6, creatinine 1.22 Labs (12/21): K 3.5, creatinine 1.08, LDL 164, TGs 133 Labs (722): K 3.8, creatinine 1.4 Labs (8/22): K 3.8, creatinine 1.23 Labs (9/22): K 3.6, creatinine 1.26 Labs (12/22): K 3.7, creatinine 1.60, hgb 16.6, TSH and LFTs normal.  PMH: 1. Nonischemic cardiomyopathy: Prior cath with no significant CAD.  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  Prior echoes with EF as low as 25%.  Echo (9/13) with EF 35-40%, moderate to severe LV dilation, diffuse hypokinesis, mild MR. Echo (5/15) with EF 30-35%, moderate to severe LAE, normal RV size and systolic function.  Angioedema with ACEI, headaches with hydralazine/nitrates. Echo (3/16) with EF 25-30%, severe LV dilation, normal RV size and systolic function.  [facility] 08/13/15 showed no significant coronary disease; RA mean 6, PA 33/11 mean 23, PCWP mean 13, Fick CO/CI 4.75 /1.68 (difficult study, radial artery spasm, if needs future cath would use groin).  Echo (9/16) showed EF 20-25%.   - Echo (3/18): EF 25-30%, moderate LAE - Echo 4/18 EF 15% - Echo 7/19 EF 20-25% - Echo 2/21 with EF 20-25%, severe LV dilation.  - Echo 6/22 with EF < 20%, normal RV. 2. HTN: angioedema with ACEI.  3. OSA: on Bipap 4. Morbid obesity 5. Paroxysmal atrial fibrillation: Urgent DCCV 6/22. 6. Smoker.  7. Anxiety/panic attacks 8. PE: 5/15, diagnosed by V/Q scan. CTA chest 8/16 negative for PE.  9. NSVT, PVCs: 30 day monitor (12/16) with PVCs, PACs, no atrial fibrillation.  - Zio patch (9/19): few short NSVT runs, no atrial fibrillation, 1.1% PVCs 10. Hematuria: Apparently had negative workup by urology.  11. ABIs (6/16) were normal 12. 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  13. Gout 14. Low back pain.  40. VT/VF 6/22   SH: Married, lives in [place], drinks ETOH occasionally, no drugs, no smoking.  Has son and daughter.     FH: No premature CAD.    Review of systems complete and found  to be negative unless listed in HPI.   Current Outpatient Medications  Medication Sig Dispense Refill   albuterol (VENTOLIN HFA) 108 (90 Base) MCG/ACT inhaler Inhale 1-2 puffs into the lungs every 4 (four) hours as needed for shortness of breath or wheezing.     amiodarone (PACERONE) 200 MG tablet Take 1 tablet (200 mg total) by mouth daily. Patient taking once daily 120 tablet 0   ARIPiprazole (ABILIFY) 10 MG tablet Take 1 tablet (10 mg total) by mouth daily. 90 tablet 0   atorvastatin (LIPITOR) 40 MG tablet Take 1 tablet (40 mg total) by mouth daily. 100 tablet 1   blood glucose meter kit and supplies KIT Use to check blood sugar. DX E11.9 1 each 0   buPROPion (WELLBUTRIN XL) 150 MG 24 hr tablet Take 1 tablet (150 mg total) by mouth daily. 90 tablet 0   carvedilol (COREG) 3.125 MG tablet Take 1 tablet (3.125 mg total) by mouth 2 (two) times daily. 60 tablet 6   clonazePAM (KLONOPIN) 1 MG tablet Take 1  tablet (1 mg total) by mouth 2 (two) times daily as needed for anxiety. 60 tablet 0   dapagliflozin propanediol (FARXIGA) 10 MG TABS tablet Take 1 tablet (10 mg total) by mouth daily. 90 tablet 1   losartan (COZAAR) 25 MG tablet TAKE 0.5 TABLETS BY MOUTH AT BEDTIME. 45 tablet 2   pantoprazole (PROTONIX) 40 MG tablet TAKE 1 TABLET BY MOUTH EVERY DAY 90 tablet 1   potassium chloride SA (KLOR-CON) 20 MEQ tablet Take 2 tablets (40 mEq total) by mouth daily. (Patient taking differently: Take 40 mEq by mouth as needed (muscle cramps).) 60 tablet 11   PRESCRIPTION MEDICATION Inhale into the lungs See admin instructions. Bipap, pressure 16/12 with 2L of O2 - use whenever sleeping     sertraline (ZOLOFT) 100 MG tablet Take 1.5 tablets (150 mg total) by mouth daily. 135 tablet 1   sildenafil (VIAGRA) 50 MG tablet Take 1 tablet (50 mg total) by mouth daily as needed for erectile dysfunction (begin with one tablet --may take 2 if necessary). 15 tablet 5   spironolactone (ALDACTONE) 25 MG tablet Take 1 tablet  (25 mg total) by mouth daily. 90 tablet 1   thiamine 100 MG tablet Take 1 tablet (100 mg total) by mouth daily. 90 tablet 1   torsemide (DEMADEX) 20 MG tablet Take 2 tablets (40 mg total) by mouth daily. 30 tablet 3   XARELTO 20 MG TABS tablet TAKE 1 TABLET BY MOUTH EVERY DAY 90 tablet 1   No current facility-administered medications for this encounter.   Allergies  Allergen Reactions   Ace Inhibitors Anaphylaxis and Swelling    Angioedema   Bidil [Isosorb Dinitrate-Hydralazine] Other (See Comments)    headache   Digoxin And Related     Unspecified "side effects"   Buspirone Other (See Comments)    dizziness   Social History   Socioeconomic History   Marital status: Married    Spouse name: Not on file   Number of children: 3   Years of education: Not on file   Highest education level: Not on file  Occupational History   Occupation: DISABLED  Tobacco Use   Smoking status: Former    Packs/day: 0.50    Years: 30.00    Pack years: 15.00    Types: Cigarettes    Quit date: 05/15/2020    Years since quitting: 1.6   Smokeless tobacco: Never   Tobacco comments:    vaping - nicotine-free products  Vaping Use   Vaping Use: Never used  Substance and Sexual Activity   Alcohol use: Not Currently    Alcohol/week: 0.0 standard drinks   Drug use: No   Sexual activity: Not Currently  Other Topics Concern   Not on file  Social History Narrative   He smokes about a pack per day and he has been smoking since he was 56 years of age.  He drinks alcohol occasionally, but he denies any illicit drug abuse.  He is presently on disability.    Lives with wife in a 2 story home.  Has 2 children.   Previously worked in [employer], last worked in 1998.   Highest level of education:  11th grade           Social Determinants of Health   Financial Resource Strain: Low Risk    Difficulty of Paying Living Expenses: Not hard at all  Food Insecurity: No Food Insecurity   Worried About Ship broker in the Last Year: Never  true   Ran Out of Food in the Last Year: Never true  Transportation Needs: No Transportation Needs   Lack of Transportation (Medical): No   Lack of Transportation (Non-Medical): No  Physical Activity: Sufficiently Active   Days of Exercise per Week: 5 days   Minutes of Exercise per Session: 30 min  Stress: No Stress Concern Present   Feeling of Stress : Not at all  Social Connections: Moderately Isolated   Frequency of Communication with Friends and Family: More than three times a week   Frequency of Social Gatherings with Friends and Family: Never   Attends Religious Services: Never   Marine scientist or Organizations: No   Attends Music therapist: Never   Marital Status: Married  Human resources officer Violence: Not on file   Family History  Problem Relation Age of Onset   Cancer Mother        brain tumor   Hypertension Mother    Diabetes Father        Deceased, 57   Heart disease Maternal Grandmother    Hypertension Other        Family History   Stroke Other        Family History   Diabetes Other        Family History   Diabetes Daughter    BP 100/70    Pulse 64    Wt (!) 170.2 kg (375 lb 3.2 oz)    SpO2 98%    BMI 55.41 kg/m   Wt Readings from Last 3 Encounters:  01/07/22 (!) 170.2 kg (375 lb 3.2 oz)  11/28/21 (!) 168.2 kg (370 lb 12.8 oz)  11/01/21 (!) 169.6 kg (373 lb 12.8 oz)   PHYSICAL EXAM: General:  NAD. No resp difficulty HEENT: Normal Neck: Supple. No JVD. Carotids 2+ bilat; no bruits. No lymphadenopathy or thryomegaly appreciated. Cor: PMI nondisplaced. Regular rate & rhythm. No rubs, gallops or murmurs. Lungs: Clear Abdomen: Soft, nontender, nondistended. No hepatosplenomegaly. No bruits or masses. Good bowel sounds. Extremities: No cyanosis, clubbing, rash, edema Neuro: Alert & oriented x 3, cranial nerves grossly intact. Moves all 4 extremities w/o difficulty. Affect pleasant.  ASSESSMENT & PLAN: 1.  Atrial fibrillation: Paroyxsmal. Emergent DCCV in 6/22. Regular on exam today, no palpitations.  - Continue amiodarone 200 mg daily, LFTs and TSH ok 12/22.  Needs regular eye exam.  - Continue Xarelto. CBC today.  2.  Chronic systolic CHF: Nonischemic cardiomyopathy. ?If ETOH has played a role (now drinks rarely). Echo (3/18) with EF 25-30%. He was seen by EP and decided against ICD given marked obesity.  QRS not wide enough for CRT. Echo 7/19 and in 2/21 with EF 20-25%. Echo 6/22 with EF < 20%, normal RV.  NYHA class II now, not volume overloaded on exam.  - Continue torsemide 40 mg daily. BMET today. - Continue spironolactone 25 mg daily. - Continue Farxiga 10 mg daily. - Continue losartan 12.5 mg at night. - Continue Coreg 3.125 mg bid.  - No Entresto, ACEI with h/o angioedema.  - Headaches with Bidil, cannot take.  - Plan for CRT-D on 01/13/22 w/ Dr. Quentin Ore. 3. HTN: Controlled.  4. 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  Had been turned down for ICD due to size, but now has lost weight and planning CRT-D soon.   5. OSA: Continue nightly BiPAP. 6. PE: 04/14/2018, diagnosed by V/Q scan. Has been on Xarelto. 7. Anxiety/Panic attacks/depression: Followed by psychiatry. 9. Hyperlipidemia: Continue atorvastatin, check lipids  today. 10. Obesity: Body mass index is 55.41 kg/m. He has been referred to pharmacy for semaglutide, but his insurance did not cover it. He is asking for another referral in hopes that it is now approved. Will arrange.  Followup with Dr. Aundra Dubin in 3 months.  [place] FNP 01/07/2022

## 2022-01-07 ENCOUNTER — Encounter (HOSPITAL_COMMUNITY): Payer: Self-pay

## 2022-01-07 ENCOUNTER — Telehealth: Payer: Self-pay | Admitting: Pharmacist

## 2022-01-07 ENCOUNTER — Other Ambulatory Visit: Payer: Self-pay

## 2022-01-07 ENCOUNTER — Ambulatory Visit (HOSPITAL_COMMUNITY)
Admission: RE | Admit: 2022-01-07 | Discharge: 2022-01-07 | Disposition: A | Discharge status: Home / Self Care | Payer: Medicare HMO | Source: Ambulatory Visit | Attending: Family Medicine | Admitting: Family Medicine

## 2022-01-07 VITALS — BP 100/70 | HR 64 | Wt 375.2 lb

## 2022-01-07 DIAGNOSIS — G4733 Obstructive sleep apnea (adult) (pediatric): Secondary | ICD-10-CM | POA: Diagnosis not present

## 2022-01-07 DIAGNOSIS — F410 Panic disorder [episodic paroxysmal anxiety] without agoraphobia: Secondary | ICD-10-CM | POA: Insufficient documentation

## 2022-01-07 DIAGNOSIS — Z7901 Long term (current) use of anticoagulants: Secondary | ICD-10-CM | POA: Insufficient documentation

## 2022-01-07 DIAGNOSIS — F32A Depression, unspecified: Secondary | ICD-10-CM | POA: Diagnosis not present

## 2022-01-07 DIAGNOSIS — Z6841 Body Mass Index (BMI) 40.0 and over, adult: Secondary | ICD-10-CM | POA: Diagnosis not present

## 2022-01-07 DIAGNOSIS — I428 Other cardiomyopathies: Secondary | ICD-10-CM | POA: Diagnosis not present

## 2022-01-07 DIAGNOSIS — Z79899 Other long term (current) drug therapy: Secondary | ICD-10-CM | POA: Insufficient documentation

## 2022-01-07 DIAGNOSIS — F17210 Nicotine dependence, cigarettes, uncomplicated: Secondary | ICD-10-CM | POA: Insufficient documentation

## 2022-01-07 DIAGNOSIS — I472 Ventricular tachycardia, unspecified: Secondary | ICD-10-CM | POA: Diagnosis not present

## 2022-01-07 DIAGNOSIS — Z7984 Long term (current) use of oral hypoglycemic drugs: Secondary | ICD-10-CM | POA: Diagnosis not present

## 2022-01-07 DIAGNOSIS — I110 Hypertensive heart disease with heart failure: Secondary | ICD-10-CM | POA: Diagnosis not present

## 2022-01-07 DIAGNOSIS — Z09 Encounter for follow-up examination after completed treatment for conditions other than malignant neoplasm: Secondary | ICD-10-CM | POA: Diagnosis not present

## 2022-01-07 DIAGNOSIS — I10 Essential (primary) hypertension: Secondary | ICD-10-CM

## 2022-01-07 DIAGNOSIS — I5042 Chronic combined systolic (congestive) and diastolic (congestive) heart failure: Secondary | ICD-10-CM | POA: Insufficient documentation

## 2022-01-07 DIAGNOSIS — F419 Anxiety disorder, unspecified: Secondary | ICD-10-CM | POA: Diagnosis not present

## 2022-01-07 DIAGNOSIS — E785 Hyperlipidemia, unspecified: Secondary | ICD-10-CM | POA: Diagnosis not present

## 2022-01-07 DIAGNOSIS — I480 Paroxysmal atrial fibrillation: Secondary | ICD-10-CM | POA: Insufficient documentation

## 2022-01-07 DIAGNOSIS — I5022 Chronic systolic (congestive) heart failure: Secondary | ICD-10-CM

## 2022-01-07 DIAGNOSIS — Z86711 Personal history of pulmonary embolism: Secondary | ICD-10-CM | POA: Diagnosis not present

## 2022-01-07 DIAGNOSIS — R69 Illness, unspecified: Secondary | ICD-10-CM | POA: Diagnosis not present

## 2022-01-07 DIAGNOSIS — Z8679 Personal history of other diseases of the circulatory system: Secondary | ICD-10-CM | POA: Diagnosis not present

## 2022-01-07 LAB — LIPID PANEL
Cholesterol: 142 mg/dL (ref 0–200)
HDL: 40 mg/dL — ABNORMAL LOW (ref >40)
LDL Cholesterol: 75 mg/dL (ref 0–99)
Total CHOL/HDL Ratio: 3.6 RATIO
Triglycerides: 133 mg/dL (ref <150)
VLDL: 27 mg/dL (ref 0–40)

## 2022-01-07 LAB — CBC
HCT: 41.2 % (ref 39.0–52.0)
Hemoglobin: 14.9 g/dL (ref 13.0–17.0)
MCH: 30.3 pg (ref 26.0–34.0)
MCHC: 36.2 g/dL — ABNORMAL HIGH (ref 30.0–36.0)
MCV: 83.7 fL (ref 80.0–100.0)
Platelets: 231 10*3/uL (ref 150–400)
RBC: 4.92 MIL/uL (ref 4.22–5.81)
RDW: 14 % (ref 11.5–15.5)
WBC: 8.1 10*3/uL (ref 4.0–10.5)
nRBC: 0 % (ref 0.0–0.2)

## 2022-01-07 LAB — BASIC METABOLIC PANEL
Anion gap: 9 (ref 5–15)
BUN: 16 mg/dL (ref 6–20)
CO2: 22 mmol/L (ref 22–32)
Calcium: 8.7 mg/dL — ABNORMAL LOW (ref 8.9–10.3)
Chloride: 109 mmol/L (ref 98–111)
Creatinine, Ser: 1.22 mg/dL (ref 0.61–1.24)
GFR, Estimated: >60 mL/min (ref >60)
Glucose, Bld: 109 mg/dL — ABNORMAL HIGH (ref 70–99)
Potassium: 3.5 mmol/L (ref 3.5–5.1)
Sodium: 140 mmol/L (ref 135–145)

## 2022-01-07 LAB — HEMOGLOBIN A1C
Hgb A1c MFr Bld: 5.3 % (ref 4.8–5.6)
Mean Plasma Glucose: 105.41 mg/dL

## 2022-01-07 NOTE — Patient Instructions (Addendum)
It was great to see you today! No medication changes are needed at this time.  Labs today We will only contact you if something comes back abnormal or we need to make some changes. Otherwise no news is good news!  You have been referred to [place] Team -they will be in touch with an appointment   Your physician recommends that you schedule a follow-up appointment in: 3-4 months with [name]  Do the following things EVERYDAY: Weigh yourself in the morning before breakfast. Write it down and keep it in a log. Take your medicines as prescribed Eat low salt foods--Limit salt (sodium) to 2000 mg per day.  Stay as active as you can everyday Limit all fluids for the day to less than 2 liters  At the [facility], you and your health needs are our priority. As part of our continuing mission to provide you with exceptional heart care, we have created designated Provider Care Teams. These Care Teams include your primary Cardiologist (physician) and Advanced Practice Providers (APPs- Physician Assistants and Nurse Practitioners) who all work together to provide you with the care you need, when you need it.   You may see any of the following providers on your designated Care Team at your next follow up: Dr Glori Bickers Dr Haynes Kerns, NP Lyda Jester, [place] [facility] [place], [place] Audry Riles, PharmD   Please be sure to bring in all your medications bottles to every appointment.

## 2022-01-07 NOTE — Telephone Encounter (Signed)
Received referral from CHF clinic to start [place] therapy for weight loss. Pt has Medicare insurance which will not cover Wegovy. His formulary does cover Ozempic without requiring prior authorization. If he changes plans in the future to one that requires a prior authorization, it would be denied as he does not have type 2 DM (A1c checked today was 5.3%). Ozempic copay would be $47/month like his Xarelto and [place] but pt assistance would not be available.  Called pt to discuss. He is interested in trying Ozempic but wants to discuss more after his ICD is placed which he has been nervous about. This is scheduled for 2/13. Will reach out to pt later that week and revisit.

## 2022-01-08 ENCOUNTER — Telehealth (HOSPITAL_COMMUNITY): Payer: Self-pay | Admitting: Cardiology

## 2022-01-08 DIAGNOSIS — I10 Essential (primary) hypertension: Secondary | ICD-10-CM

## 2022-01-08 MED ORDER — POTASSIUM CHLORIDE CRYS ER 20 MEQ PO TBCR: 20.0000 meq | EXTENDED_RELEASE_TABLET | Freq: Every day | ORAL | 11 refills | Status: DC

## 2022-01-08 NOTE — Telephone Encounter (Signed)
Patient called.  Patient aware.Phone:  [phone] Jerilynn Mages)

## 2022-01-08 NOTE — Telephone Encounter (Signed)
-----   Message from Rafael Bihari, [place] sent at 01/07/2022  3:17 PM EST ----- Lipids look good.   A1C down to 5.3 from 6.2! Great job!  K is borderline low. Start 20 KCL daily

## 2022-01-10 NOTE — Pre-Procedure Instructions (Signed)
Instructed patient on the following items: Arrival time 0930 Nothing to eat or drink after midnight No meds AM of procedure Responsible person to drive you home and stay with you for 24 hrs Wash with special soap night before and morning of procedure If on anti-coagulant drug instructions Xarelto- last dose 2/9

## 2022-01-11 ENCOUNTER — Other Ambulatory Visit (HOSPITAL_COMMUNITY): Payer: Self-pay | Admitting: Family Medicine

## 2022-01-13 ENCOUNTER — Ambulatory Visit (HOSPITAL_COMMUNITY): Payer: Medicare HMO

## 2022-01-13 ENCOUNTER — Ambulatory Visit (HOSPITAL_BASED_OUTPATIENT_CLINIC_OR_DEPARTMENT_OTHER): Payer: Medicare HMO | Admitting: Certified Registered"

## 2022-01-13 ENCOUNTER — Encounter (HOSPITAL_COMMUNITY): Payer: Self-pay | Admitting: Cardiology

## 2022-01-13 ENCOUNTER — Inpatient Hospital Stay (HOSPITAL_COMMUNITY)
Admission: RE | Admit: 2022-01-13 | Discharge: 2022-01-16 | DRG: 981 | Disposition: A | Discharge status: Home / Self Care | Payer: Medicare HMO | Attending: Cardiology | Admitting: Cardiology

## 2022-01-13 ENCOUNTER — Encounter (HOSPITAL_COMMUNITY)
Admission: AD | Disposition: A | Discharge status: Home / Self Care | Payer: Self-pay | Source: Home / Self Care | Attending: Cardiology

## 2022-01-13 ENCOUNTER — Other Ambulatory Visit: Payer: Self-pay

## 2022-01-13 ENCOUNTER — Telehealth: Payer: Medicaid Other

## 2022-01-13 ENCOUNTER — Ambulatory Visit (HOSPITAL_COMMUNITY): Payer: Medicare HMO | Admitting: Certified Registered"

## 2022-01-13 DIAGNOSIS — U071 COVID-19: Principal | ICD-10-CM | POA: Diagnosis present

## 2022-01-13 DIAGNOSIS — I130 Hypertensive heart and chronic kidney disease with heart failure and stage 1 through stage 4 chronic kidney disease, or unspecified chronic kidney disease: Secondary | ICD-10-CM | POA: Diagnosis present

## 2022-01-13 DIAGNOSIS — I10 Essential (primary) hypertension: Secondary | ICD-10-CM | POA: Diagnosis not present

## 2022-01-13 DIAGNOSIS — I447 Left bundle-branch block, unspecified: Secondary | ICD-10-CM | POA: Diagnosis not present

## 2022-01-13 DIAGNOSIS — E1122 Type 2 diabetes mellitus with diabetic chronic kidney disease: Secondary | ICD-10-CM | POA: Diagnosis present

## 2022-01-13 DIAGNOSIS — I517 Cardiomegaly: Secondary | ICD-10-CM | POA: Diagnosis not present

## 2022-01-13 DIAGNOSIS — J1282 Pneumonia due to coronavirus disease 2019: Principal | ICD-10-CM | POA: Diagnosis present

## 2022-01-13 DIAGNOSIS — Z8249 Family history of ischemic heart disease and other diseases of the circulatory system: Secondary | ICD-10-CM

## 2022-01-13 DIAGNOSIS — J811 Chronic pulmonary edema: Secondary | ICD-10-CM | POA: Diagnosis not present

## 2022-01-13 DIAGNOSIS — E119 Type 2 diabetes mellitus without complications: Secondary | ICD-10-CM | POA: Diagnosis not present

## 2022-01-13 DIAGNOSIS — J9690 Respiratory failure, unspecified, unspecified whether with hypoxia or hypercapnia: Secondary | ICD-10-CM | POA: Diagnosis present

## 2022-01-13 DIAGNOSIS — G4733 Obstructive sleep apnea (adult) (pediatric): Secondary | ICD-10-CM | POA: Diagnosis present

## 2022-01-13 DIAGNOSIS — Z95810 Presence of automatic (implantable) cardiac defibrillator: Secondary | ICD-10-CM | POA: Diagnosis not present

## 2022-01-13 DIAGNOSIS — R7989 Other specified abnormal findings of blood chemistry: Secondary | ICD-10-CM

## 2022-01-13 DIAGNOSIS — I428 Other cardiomyopathies: Secondary | ICD-10-CM

## 2022-01-13 DIAGNOSIS — Z79899 Other long term (current) drug therapy: Secondary | ICD-10-CM

## 2022-01-13 DIAGNOSIS — J189 Pneumonia, unspecified organism: Secondary | ICD-10-CM | POA: Diagnosis not present

## 2022-01-13 DIAGNOSIS — Z833 Family history of diabetes mellitus: Secondary | ICD-10-CM

## 2022-01-13 DIAGNOSIS — I110 Hypertensive heart disease with heart failure: Secondary | ICD-10-CM | POA: Diagnosis not present

## 2022-01-13 DIAGNOSIS — Z6841 Body Mass Index (BMI) 40.0 and over, adult: Secondary | ICD-10-CM

## 2022-01-13 DIAGNOSIS — I480 Paroxysmal atrial fibrillation: Secondary | ICD-10-CM | POA: Diagnosis present

## 2022-01-13 DIAGNOSIS — I5022 Chronic systolic (congestive) heart failure: Secondary | ICD-10-CM | POA: Diagnosis not present

## 2022-01-13 DIAGNOSIS — I509 Heart failure, unspecified: Secondary | ICD-10-CM | POA: Diagnosis not present

## 2022-01-13 DIAGNOSIS — N1831 Chronic kidney disease, stage 3a: Secondary | ICD-10-CM | POA: Diagnosis present

## 2022-01-13 DIAGNOSIS — Z7984 Long term (current) use of oral hypoglycemic drugs: Secondary | ICD-10-CM

## 2022-01-13 DIAGNOSIS — I5042 Chronic combined systolic (congestive) and diastolic (congestive) heart failure: Secondary | ICD-10-CM | POA: Diagnosis present

## 2022-01-13 DIAGNOSIS — R0602 Shortness of breath: Secondary | ICD-10-CM

## 2022-01-13 DIAGNOSIS — Z7901 Long term (current) use of anticoagulants: Secondary | ICD-10-CM

## 2022-01-13 DIAGNOSIS — Z87891 Personal history of nicotine dependence: Secondary | ICD-10-CM

## 2022-01-13 DIAGNOSIS — Z86711 Personal history of pulmonary embolism: Secondary | ICD-10-CM

## 2022-01-13 DIAGNOSIS — F411 Generalized anxiety disorder: Secondary | ICD-10-CM | POA: Diagnosis present

## 2022-01-13 DIAGNOSIS — Z888 Allergy status to other drugs, medicaments and biological substances status: Secondary | ICD-10-CM

## 2022-01-13 DIAGNOSIS — M109 Gout, unspecified: Secondary | ICD-10-CM | POA: Diagnosis present

## 2022-01-13 DIAGNOSIS — E118 Type 2 diabetes mellitus with unspecified complications: Secondary | ICD-10-CM | POA: Diagnosis present

## 2022-01-13 DIAGNOSIS — J9601 Acute respiratory failure with hypoxia: Secondary | ICD-10-CM | POA: Diagnosis present

## 2022-01-13 HISTORY — PX: BIV ICD INSERTION CRT-D: EP1195

## 2022-01-13 LAB — CBC WITH DIFFERENTIAL/PLATELET
Abs Immature Granulocytes: 0.03 10*3/uL (ref 0.00–0.07)
Basophils Absolute: 0 10*3/uL (ref 0.0–0.1)
Basophils Relative: 0 %
Eosinophils Absolute: 0 10*3/uL (ref 0.0–0.5)
Eosinophils Relative: 0 %
HCT: 39.3 % (ref 39.0–52.0)
Hemoglobin: 14.3 g/dL (ref 13.0–17.0)
Immature Granulocytes: 0 %
Lymphocytes Relative: 9 %
Lymphs Abs: 0.8 10*3/uL (ref 0.7–4.0)
MCH: 30.5 pg (ref 26.0–34.0)
MCHC: 36.4 g/dL — ABNORMAL HIGH (ref 30.0–36.0)
MCV: 83.8 fL (ref 80.0–100.0)
Monocytes Absolute: 1.3 10*3/uL — ABNORMAL HIGH (ref 0.1–1.0)
Monocytes Relative: 14 %
Neutro Abs: 7 10*3/uL (ref 1.7–7.7)
Neutrophils Relative %: 77 %
Platelets: 192 10*3/uL (ref 150–400)
RBC: 4.69 MIL/uL (ref 4.22–5.81)
RDW: 14.3 % (ref 11.5–15.5)
WBC: 9.2 10*3/uL (ref 4.0–10.5)
nRBC: 0 % (ref 0.0–0.2)

## 2022-01-13 LAB — BRAIN NATRIURETIC PEPTIDE: B Natriuretic Peptide: 292.4 pg/mL — ABNORMAL HIGH (ref 0.0–100.0)

## 2022-01-13 LAB — COMPREHENSIVE METABOLIC PANEL
ALT: 28 U/L (ref 0–44)
AST: 28 U/L (ref 15–41)
Albumin: 3.3 g/dL — ABNORMAL LOW (ref 3.5–5.0)
Alkaline Phosphatase: 105 U/L (ref 38–126)
Anion gap: 10 (ref 5–15)
BUN: 23 mg/dL — ABNORMAL HIGH (ref 6–20)
CO2: 23 mmol/L (ref 22–32)
Calcium: 8.3 mg/dL — ABNORMAL LOW (ref 8.9–10.3)
Chloride: 106 mmol/L (ref 98–111)
Creatinine, Ser: 1.71 mg/dL — ABNORMAL HIGH (ref 0.61–1.24)
GFR, Estimated: 47 mL/min — ABNORMAL LOW (ref >60)
Glucose, Bld: 139 mg/dL — ABNORMAL HIGH (ref 70–99)
Potassium: 3.3 mmol/L — ABNORMAL LOW (ref 3.5–5.1)
Sodium: 139 mmol/L (ref 135–145)
Total Bilirubin: 0.6 mg/dL (ref 0.3–1.2)
Total Protein: 6.7 g/dL (ref 6.5–8.1)

## 2022-01-13 LAB — SARS CORONAVIRUS 2 BY RT PCR (HOSPITAL ORDER, PERFORMED IN ~~LOC~~ HOSPITAL LAB): SARS Coronavirus 2: POSITIVE — AB

## 2022-01-13 LAB — D-DIMER, QUANTITATIVE: D-Dimer, Quant: 2.42 ug/mL-FEU — ABNORMAL HIGH (ref 0.00–0.50)

## 2022-01-13 LAB — GLUCOSE, CAPILLARY
Glucose-Capillary: 121 mg/dL — ABNORMAL HIGH (ref 70–99)
Glucose-Capillary: 149 mg/dL — ABNORMAL HIGH (ref 70–99)

## 2022-01-13 LAB — C-REACTIVE PROTEIN: CRP: 2 mg/dL — ABNORMAL HIGH (ref <1.0)

## 2022-01-13 LAB — PROCALCITONIN: Procalcitonin: 1.4 ng/mL

## 2022-01-13 SURGERY — BIV ICD INSERTION CRT-D
Anesthesia: General

## 2022-01-13 MED ORDER — PROPOFOL 10 MG/ML IV BOLUS
INTRAVENOUS | Status: DC | PRN
Start: 2022-01-13 — End: 2022-01-13
  Administered 2022-01-13: 100 mg via INTRAVENOUS
  Administered 2022-01-13 (×2): 50 mg via INTRAVENOUS

## 2022-01-13 MED ORDER — LIDOCAINE HCL (PF) 1 % IJ SOLN
INTRAMUSCULAR | Status: DC | PRN
  Administered 2022-01-13: 50 mL

## 2022-01-13 MED ORDER — CHLORHEXIDINE GLUCONATE 4 % EX LIQD: 4.0000 "application " | Freq: Once | CUTANEOUS | Status: DC

## 2022-01-13 MED ORDER — FUROSEMIDE 10 MG/ML IJ SOLN
40.0000 mg | Freq: Once | INTRAMUSCULAR | Status: AC
  Administered 2022-01-13: 40 mg via INTRAVENOUS

## 2022-01-13 MED ORDER — ONDANSETRON HCL 4 MG/2ML IJ SOLN: 4.0000 mg | Freq: Four times a day (QID) | INTRAMUSCULAR | Status: DC | PRN

## 2022-01-13 MED ORDER — PHENYLEPHRINE HCL-NACL 20-0.9 MG/250ML-% IV SOLN
INTRAVENOUS | Status: DC | PRN
  Administered 2022-01-13: 25 ug/min via INTRAVENOUS

## 2022-01-13 MED ORDER — NITROGLYCERIN IN D5W 200-5 MCG/ML-% IV SOLN
INTRAVENOUS | Status: AC
  Filled 2022-01-13: qty 250

## 2022-01-13 MED ORDER — SODIUM CHLORIDE 0.9 % IV SOLN
INTRAVENOUS | Status: AC
  Filled 2022-01-13: qty 2

## 2022-01-13 MED ORDER — ZINC SULFATE 220 (50 ZN) MG PO CAPS
220.0000 mg | ORAL_CAPSULE | Freq: Every day | ORAL | Status: DC
  Administered 2022-01-14 – 2022-01-16 (×3): 220 mg via ORAL
  Filled 2022-01-13 (×3): qty 1

## 2022-01-13 MED ORDER — NITROGLYCERIN IN D5W 200-5 MCG/ML-% IV SOLN
5.0000 ug/min | INTRAVENOUS | Status: DC
  Administered 2022-01-13: 50 ug/min via INTRAVENOUS

## 2022-01-13 MED ORDER — BUPROPION HCL ER (XL) 150 MG PO TB24
150.0000 mg | ORAL_TABLET | Freq: Every day | ORAL | Status: DC
  Administered 2022-01-14 – 2022-01-16 (×3): 150 mg via ORAL
  Filled 2022-01-13 (×3): qty 1

## 2022-01-13 MED ORDER — SODIUM CHLORIDE 0.9 % IV SOLN
80.0000 mg | INTRAVENOUS | Status: AC
  Administered 2022-01-13: 80 mg

## 2022-01-13 MED ORDER — SPIRONOLACTONE 25 MG PO TABS
25.0000 mg | ORAL_TABLET | Freq: Every day | ORAL | Status: DC
  Administered 2022-01-14 – 2022-01-16 (×3): 25 mg via ORAL
  Filled 2022-01-13 (×3): qty 1

## 2022-01-13 MED ORDER — SODIUM CHLORIDE 0.9 % IV SOLN: INTRAVENOUS | Status: DC

## 2022-01-13 MED ORDER — FUROSEMIDE 10 MG/ML IJ SOLN
INTRAMUSCULAR | Status: AC
  Filled 2022-01-13: qty 4

## 2022-01-13 MED ORDER — TORSEMIDE 20 MG PO TABS
20.0000 mg | ORAL_TABLET | Freq: Two times a day (BID) | ORAL | Status: DC
  Administered 2022-01-14 – 2022-01-16 (×5): 20 mg via ORAL
  Filled 2022-01-13 (×5): qty 1

## 2022-01-13 MED ORDER — CARVEDILOL 3.125 MG PO TABS
3.1250 mg | ORAL_TABLET | Freq: Two times a day (BID) | ORAL | Status: DC
  Administered 2022-01-14 – 2022-01-16 (×5): 3.125 mg via ORAL
  Filled 2022-01-13 (×6): qty 1

## 2022-01-13 MED ORDER — CEFAZOLIN IN SODIUM CHLORIDE 3-0.9 GM/100ML-% IV SOLN
3.0000 g | INTRAVENOUS | Status: AC
  Administered 2022-01-13: 3 g via INTRAVENOUS
  Filled 2022-01-13: qty 100

## 2022-01-13 MED ORDER — NIRMATRELVIR/RITONAVIR (PAXLOVID)TABLET
3.0000 | ORAL_TABLET | Freq: Two times a day (BID) | ORAL | Status: DC
  Administered 2022-01-13: 3 via ORAL
  Filled 2022-01-13: qty 30

## 2022-01-13 MED ORDER — LOSARTAN POTASSIUM 25 MG PO TABS: 12.5000 mg | ORAL_TABLET | Freq: Every day | ORAL | Status: DC

## 2022-01-13 MED ORDER — CEFAZOLIN SODIUM-DEXTROSE 2-4 GM/100ML-% IV SOLN
2.0000 g | Freq: Three times a day (TID) | INTRAVENOUS | Status: DC
  Administered 2022-01-13: 2 g via INTRAVENOUS
  Filled 2022-01-13 (×3): qty 100

## 2022-01-13 MED ORDER — ALBUTEROL SULFATE HFA 108 (90 BASE) MCG/ACT IN AERS
2.0000 | INHALATION_SPRAY | Freq: Four times a day (QID) | RESPIRATORY_TRACT | Status: DC
  Administered 2022-01-13 – 2022-01-14 (×2): 2 via RESPIRATORY_TRACT
  Filled 2022-01-13: qty 6.7

## 2022-01-13 MED ORDER — POVIDONE-IODINE 10 % EX SWAB: 2.0000 "application " | Freq: Once | CUTANEOUS | Status: DC

## 2022-01-13 MED ORDER — DAPAGLIFLOZIN PROPANEDIOL 10 MG PO TABS
10.0000 mg | ORAL_TABLET | Freq: Every day | ORAL | Status: DC
  Administered 2022-01-14 – 2022-01-16 (×3): 10 mg via ORAL
  Filled 2022-01-13 (×3): qty 1

## 2022-01-13 MED ORDER — SERTRALINE HCL 100 MG PO TABS
150.0000 mg | ORAL_TABLET | Freq: Every day | ORAL | Status: DC
  Administered 2022-01-13 – 2022-01-16 (×4): 150 mg via ORAL
  Filled 2022-01-13 (×4): qty 1

## 2022-01-13 MED ORDER — PROPOFOL 500 MG/50ML IV EMUL
INTRAVENOUS | Status: DC | PRN
  Administered 2022-01-13: 150 ug/kg/min via INTRAVENOUS

## 2022-01-13 MED ORDER — IOHEXOL 350 MG/ML SOLN
INTRAVENOUS | Status: DC | PRN
  Administered 2022-01-13 (×3): 5 mL

## 2022-01-13 MED ORDER — ACETAMINOPHEN 325 MG PO TABS
325.0000 mg | ORAL_TABLET | ORAL | Status: DC | PRN
  Administered 2022-01-14 (×2): 650 mg via ORAL
  Filled 2022-01-13 (×2): qty 2

## 2022-01-13 MED ORDER — ACETAMINOPHEN 500 MG PO TABS
1000.0000 mg | ORAL_TABLET | Freq: Three times a day (TID) | ORAL | Status: DC
  Administered 2022-01-13 – 2022-01-16 (×7): 1000 mg via ORAL
  Filled 2022-01-13 (×9): qty 2

## 2022-01-13 MED ORDER — LIDOCAINE HCL 1 % IJ SOLN
INTRAMUSCULAR | Status: AC
  Filled 2022-01-13: qty 60

## 2022-01-13 MED ORDER — AMIODARONE HCL 200 MG PO TABS
200.0000 mg | ORAL_TABLET | Freq: Every day | ORAL | Status: DC
  Administered 2022-01-14 – 2022-01-16 (×3): 200 mg via ORAL
  Filled 2022-01-13 (×3): qty 1

## 2022-01-13 MED ORDER — SUCCINYLCHOLINE CHLORIDE 200 MG/10ML IV SOSY
PREFILLED_SYRINGE | INTRAVENOUS | Status: DC | PRN
Start: 2022-01-13 — End: 2022-01-13
  Administered 2022-01-13: 200 mg via INTRAVENOUS

## 2022-01-13 MED ORDER — ASCORBIC ACID 500 MG PO TABS
500.0000 mg | ORAL_TABLET | Freq: Every day | ORAL | Status: DC
  Administered 2022-01-14 – 2022-01-16 (×3): 500 mg via ORAL
  Filled 2022-01-13 (×3): qty 1

## 2022-01-13 MED ORDER — HEPARIN (PORCINE) IN NACL 1000-0.9 UT/500ML-% IV SOLN
INTRAVENOUS | Status: DC | PRN
  Administered 2022-01-13: 500 mL

## 2022-01-13 MED ORDER — GUAIFENESIN-DM 100-10 MG/5ML PO SYRP
10.0000 mL | ORAL_SOLUTION | ORAL | Status: DC | PRN
  Administered 2022-01-14: 10 mL via ORAL
  Filled 2022-01-13: qty 10

## 2022-01-13 SURGICAL SUPPLY — 19 items
ATTRACTOMAT 16X20 MAGNETIC DRP (DRAPES) ×1 IMPLANT
BALLN COR SINUS VENO 6FR 80 (BALLOONS) ×2
BALLOON COR SINUS VENO 6FR 80 (BALLOONS) IMPLANT
CABLE SURGICAL S-101-97-12 (CABLE) ×2 IMPLANT
CATH CPS DIRECT WD DS2C028 (CATHETERS) ×1 IMPLANT
ICD GALLANT HFCRTD CDHFA500Q (ICD Generator) ×1 IMPLANT
LEAD DURATA 7122Q-65CM (Lead) ×1 IMPLANT
LEAD QUARTET 1458QL-86 (Lead) IMPLANT
LEAD TENDRIL MRI 52CM LPA1200M (Lead) ×1 IMPLANT
PAD DEFIB RADIO PHYSIO CONN (PAD) ×2 IMPLANT
QUARTET 1458QL-86 (Lead) ×2 IMPLANT
SHEATH 7FR PRELUDE SNAP 13 (SHEATH) ×1 IMPLANT
SHEATH 8FR PRELUDE SNAP 13 (SHEATH) ×2 IMPLANT
SHEATH 9.5FR PRELUDE SNAP 13 (SHEATH) ×1 IMPLANT
SHEATH PROBE COVER 6X72 (BAG) ×1 IMPLANT
SLITTER UNIVERSAL DS2A003 (MISCELLANEOUS) ×1 IMPLANT
TRAY PACEMAKER INSERTION (PACKS) ×2 IMPLANT
WIRE ACUITY WHISPER EDS 4648 (WIRE) ×2 IMPLANT
WIRE HI TORQ VERSACORE-J 145CM (WIRE) ×2 IMPLANT

## 2022-01-13 NOTE — Anesthesia Preprocedure Evaluation (Signed)
Anesthesia Evaluation  Patient identified by MRN, date of birth, ID band Patient awake    Reviewed: Allergy & Precautions, NPO status , Patient's Chart, lab work & pertinent test results, reviewed documented beta blocker date and time   History of Anesthesia Complications Negative for: history of anesthetic complications  Airway Mallampati: II  TM Distance: >3 FB Neck ROM: Full    Dental  (+) Dental Advisory Given, Teeth Intact   Pulmonary shortness of breath and with exertion, sleep apnea and Continuous Positive Airway Pressure Ventilation , neg recent URI, former smoker,    breath sounds clear to auscultation       Cardiovascular hypertension, Pt. on medications and Pt. on home beta blockers +CHF   Rhythm:Regular  1. No LV thrombus. Left ventricular ejection fraction, by estimation, is  <20%. The left ventricle has severely decreased function. The left  ventricular internal cavity size was severely dilated.  2. Right ventricular systolic function is mildly reduced. The right  ventricular size is normal.  3. Left atrial size was mildly dilated. No left atrial/left atrial  appendage thrombus was detected.  4. The mitral valve is normal in structure. Mild mitral valve  regurgitation. No evidence of mitral stenosis.  5. The aortic valve was not well visualized. Aortic valve regurgitation  is mild. No aortic stenosis is present.  6. Technically difficult study, ended early due to patient cough/gag.   Conclusion  1. No significant coronary disease.  2. Mildly elevated right heart filling pressure, PWCP normal.  3. Low, but not markedly low, cardiac output.     Neuro/Psych  Headaches, PSYCHIATRIC DISORDERS Anxiety  Neuromuscular disease    GI/Hepatic Neg liver ROS, GERD  Medicated and Controlled,  Endo/Other  diabetesMorbid obesity  Renal/GU negative Renal ROSLab Results      Component                Value                Date                      CREATININE               1.22                01/07/2022                Musculoskeletal  (+) Arthritis ,   Abdominal   Peds  Hematology Lab Results      Component                Value               Date                      WBC                      8.1                 01/07/2022                HGB                      14.9                01/07/2022                HCT  41.2                01/07/2022                MCV                      83.7                01/07/2022                PLT                      231                 01/07/2022              Anesthesia Other Findings   Reproductive/Obstetrics                             Anesthesia Physical Anesthesia Plan  ASA: 4  Anesthesia Plan: General   Post-op Pain Management: Minimal or no pain anticipated   Induction: Intravenous  PONV Risk Score and Plan: 2 and Ondansetron and Dexamethasone  Airway Management Planned: Oral ETT and LMA  Additional Equipment: None  Intra-op Plan:   Post-operative Plan: Extubation in OR  Informed Consent: I have reviewed the patients History and Physical, chart, labs and discussed the procedure including the risks, benefits and alternatives for the proposed anesthesia with the patient or authorized representative who has indicated his/her understanding and acceptance.     Dental advisory given  Plan Discussed with: Anesthesiologist and CRNA  Anesthesia Plan Comments:         Anesthesia Quick Evaluation

## 2022-01-13 NOTE — Progress Notes (Signed)
Removed patient from BIPAP and placed him on 5lpm nasal cannula. Will place patient on bipap tonight.

## 2022-01-13 NOTE — Consult Note (Signed)
Medical Consultation   [name]  KGM:[id number]  DOB: 1966-04-24  DOA: 01/13/2022  PCP: [name], MD   Chief Complaint: covid positivity    Requesting physician:  Dr. Quentin Ore  Reason for consultation: Covid Positivity   History of Present Illness:  56 year old male with past medical history of V-fib arrest (05/2021, no ICD at the time), nonischemic cardiomyopathy (cath 03/2017 with no CAD) with systolic congestive heart failure (Echo 05/2021 EF <20%), prior history of EtOH abuse, pulmonary embolism 03/2014 status post course of Xarelto, class III obesity (BMI 55), obstructive sleep apnea (on BIPAP QHS), nicotine dependence, hypertension and paroxysmal atrial fibrillation status post DCCV 05/2021 who presented to and was admitted to the electrophysiology service earlier today (2/13) for ICD insertion.  Patient underwent successful placement of a Saint Jude CRT-D device.  Patient was initially placed on BiPAP therapy post procedure and has now been transferred to [facility] while being transitioned to nasal cannula.  Patient was identified to be COVID-positive via PCR testing this afternoon.  The hospitalist group has been consulted to evaluate the patient, determine the clinical significance of this positive test and assist with management.  Per my discussion with the patient he reports that in the days preceding his admission to the hospital his shortness of breath has not deviated from his baseline nor has he developed a new cough.  He states that he has not had any fevers preceding his hospitalization.  He reports that his daughter has been ill for the past several days with respiratory symptoms.   Review of Systems:   Review of Systems  Constitutional:  Positive for fever and malaise/fatigue.  All other systems reviewed and are negative.    Past Medical History: Past Medical History:  Diagnosis Date   Alcohol abuse    Anxiety state, unspecified    Atrial  fibrillation (HCC)    CHF (congestive heart failure) (HCC)    Chronic systolic heart failure (HCC)    Diabetes mellitus, type II (HCC)    Edema    Gout    History of medication noncompliance    Migraine    Obesity, unspecified    Obstructive sleep apnea    Psychiatric disorder    Pulmonary embolism (HCC)    Shortness of breath     Past Surgical History: Past Surgical History:  Procedure Laterality Date   CARDIAC CATHETERIZATION     CARDIAC CATHETERIZATION N/A 08/13/2015   Procedure: Right/Left Heart Cath and Coronary Angiography;  Surgeon: [name], MD;  Location: [facility];  Service: Cardiovascular;  Laterality: N/A;   RIGHT/LEFT HEART CATH AND CORONARY ANGIOGRAPHY N/A 04/14/2017   Procedure: Right/Left Heart Cath and Coronary Angiography;  Surgeon: [name], MD;  Location: [facility];  Service: Cardiovascular;  Laterality: N/A;   TESTICLE SURGERY       Allergies:   Allergies  Allergen Reactions   Ace Inhibitors Anaphylaxis and Swelling    Angioedema   Bidil [Isosorb Dinitrate-Hydralazine] Other (See Comments)    headache   Digoxin And Related     Unspecified "side effects"   Buspirone Other (See Comments)    dizziness     Social History:  reports that he quit smoking about 20 months ago. His smoking use included cigarettes. He has a 15.00 pack-year smoking history. He has never used smokeless tobacco. He reports that he does not currently use alcohol.  He reports that he does not use drugs.   Family History: Family History  Problem Relation Age of Onset   Cancer Mother        brain tumor   Hypertension Mother    Diabetes Father        Deceased, 20   Heart disease Maternal Grandmother    Hypertension Other        Family History   Stroke Other        Family History   Diabetes Other        Family History   Diabetes Daughter        Physical Exam: Vitals:   01/13/22 2303 01/13/22 2359 01/14/22 0218 01/14/22 0308  BP:   108/85  132/88  Pulse: 78 73 73 73  Resp: (!) 22 19 19 12   Temp:  99.8 F (37.7 C)  (!) 100.7 F (38.2 C)  TempSrc:  Oral  Oral  SpO2: 99% 95% 95% 100%  Weight:      Height:        Constitutional: Acute alert and oriented x3, no associated distress.   Skin: no rashes, no lesions, good skin turgor noted. Eyes: Pupils are equally reactive to light.  No evidence of scleral icterus or conjunctival pallor.  ENMT: Mucous membranes are moist. Posterior pharynx clear of any exudate or lesions. Normal dentition.   Neck: normal, supple, no masses, no thyromegaly Respiratory: Bibasilar rales noted with scattered rhonchi bilaterally.  No evidence of wheezing.  Normal respiratory effort. No accessory muscle use.  Cardiovascular: Regular rate and rhythm, no murmurs / rubs / gallops.  Trace distal bilateral lower extremity edema.  2+ pedal pulses. No carotid bruits.  Back:   Nontender without crepitus or deformity. Abdomen: Abdomen is soft and nontender.  No evidence of intra-abdominal masses.  Positive bowel sounds noted in all quadrants.   Musculoskeletal: No joint deformity upper and lower extremities. Good ROM, no contractures. Normal muscle tone.  Neurologic: CN 2-12 grossly intact. Sensation intact, strength noted to be 5 out of 5 in all 4 extremities.  Patient is following all commands.  Patient is responsive to verbal stimuli.   Psychiatric: Patient presents as a normal mood with appropriate affect.  Patient seems to possess insight as to theircurrent situation.      Data reviewed:  I have personally reviewed following labs and imaging studies Labs:  CBC: Recent Labs  Lab 01/07/22 1214 01/13/22 2220 01/14/22 0415  WBC 8.1 9.2 8.7  NEUTROABS  --  7.0 5.4  HGB 14.9 14.3 14.8  HCT 41.2 39.3 40.7  MCV 83.7 83.8 83.9  PLT 231 192 952    Basic Metabolic Panel: Recent Labs  Lab 01/07/22 1214 01/13/22 2220 01/14/22 0415  NA 140 139 138  K 3.5 3.3* 3.8  CL 109 106 106  CO2 22 23  23   GLUCOSE 109* 139* 94  BUN 16 23* 22*  CREATININE 1.22 1.71* 1.48*  CALCIUM 8.7* 8.3* 9.0  MG  --   --  2.0   GFR Estimated Creatinine Clearance: 90.2 mL/min (A) (by C-G formula based on SCr of 1.48 mg/dL (H)). Liver Function Tests: Recent Labs  Lab 01/13/22 2220 01/14/22 0415  AST 28 28  ALT 28 26  ALKPHOS 105 107  BILITOT 0.6 0.6  PROT 6.7 7.0  ALBUMIN 3.3* 3.4*   No results for input(s): LIPASE, AMYLASE in the last 168 hours. No results for input(s): AMMONIA in the last 168 hours. Coagulation profile No results for  input(s): INR, PROTIME in the last 168 hours.  Cardiac Enzymes: No results for input(s): CKTOTAL, CKMB, CKMBINDEX, TROPONINI in the last 168 hours. BNP: Invalid input(s): POCBNP CBG: Recent Labs  Lab 01/13/22 1000 01/13/22 1527  GLUCAP 121* 149*   D-Dimer Recent Labs    01/13/22 2220 01/14/22 0415  DDIMER 2.42* 3.29*   Hgb A1c No results for input(s): HGBA1C in the last 72 hours. Lipid Profile No results for input(s): CHOL, HDL, LDLCALC, TRIG, CHOLHDL, LDLDIRECT in the last 72 hours. Thyroid function studies No results for input(s): TSH, T4TOTAL, T3FREE, THYROIDAB in the last 72 hours.  Invalid input(s): FREET3 Anemia work up No results for input(s): VITAMINB12, FOLATE, FERRITIN, TIBC, IRON, RETICCTPCT in the last 72 hours. Urinalysis    Component Value Date/Time   COLORURINE YELLOW 11/19/2020 [place] 11/19/2020 1454   LABSPEC 1.020 11/19/2020 1454   PHURINE 6.0 11/19/2020 1454   GLUCOSEU >=1000 (A) 11/19/2020 1454   HGBUR NEGATIVE 11/19/2020 1454   BILIRUBINUR NEGATIVE 11/19/2020 1454   KETONESUR TRACE (A) 11/19/2020 1454   PROTEINUR NEGATIVE 04/22/2018 0345   UROBILINOGEN >=8.0 (A) 11/19/2020 1454   NITRITE NEGATIVE 11/19/2020 1454   LEUKOCYTESUR NEGATIVE 11/19/2020 1454     Microbiology Recent Results (from the past 240 hour(s))  SARS Coronavirus 2 by RT PCR (hospital order, performed in [facility]  hospital lab) Nasopharyngeal Nasopharyngeal Swab     Status: Abnormal   Collection Time: 01/13/22  3:51 PM   Specimen: Nasopharyngeal Swab  Result Value Ref Range Status   SARS Coronavirus 2 POSITIVE (A) NEGATIVE Final    Comment: (NOTE) SARS-CoV-2 target nucleic acids are DETECTED  SARS-CoV-2 RNA is generally detectable in upper respiratory specimens  during the acute phase of infection.  Positive results are indicative  of the presence of the identified virus, but do not rule out bacterial infection or co-infection with other pathogens not detected by the test.  Clinical correlation with patient history and  other diagnostic information is necessary to determine patient infection status.  The expected result is negative.  Fact Sheet for Patients:   StrictlyIdeas.no   Fact Sheet for Healthcare Providers:   BankingDealers.co.za    This test is not yet approved or cleared by the [place] FDA and  has been authorized for detection and/or diagnosis of SARS-CoV-2 by FDA under an Emergency Use Authorization (EUA).  This EUA will remain in effect (meaning this test can be used) for the duration of  the COVID-19 declaration under Section 564(b)(1)  of the Act, 21 U.S.C. section 360-bbb-3(b)(1), unless the authorization is terminated or revoked sooner.  Performed at [facility], [place] [address], [place], [place] [address]        Inpatient Medications:   Scheduled Meds:  acetaminophen  1,000 mg Oral Q8H   amiodarone  200 mg Oral Daily   vitamin C  500 mg Oral Daily   buPROPion  150 mg Oral Daily   carvedilol  3.125 mg Oral BID WC   dapagliflozin propanediol  10 mg Oral Daily   methylPREDNISolone (SOLU-MEDROL) injection  0.5 mg/kg Intravenous BID   Followed by   Derrill Memo ON 01/17/2022] predniSONE  50 mg Oral Daily   sertraline  150 mg Oral Daily   spironolactone  25 mg Oral Daily   torsemide  20 mg Oral BID   zinc sulfate   220 mg Oral Daily   Continuous Infusions:  cefTRIAXone (ROCEPHIN)  IV 2 g (01/14/22 3086)   doxycycline (  VIBRAMYCIN) IV     remdesivir 200 mg in sodium chloride 0.9% 250 mL IVPB     Followed by   Derrill Memo ON 01/15/2022] remdesivir 100 mg in NS 100 mL       Radiological Exams on Admission: EP PPM/ICD IMPLANT  Addendum Date: 01/13/2022   CONCLUSIONS: 1. Successful implantation of a St Jude CRT-D for nonischemic cardiomyopathy 2. No early apparent complications.   Result Date: 01/13/2022 CONCLUSIONS: 1. Successful implantation of a St Jude CRT-D for nonischemic cardiomyopathy 2. No early apparent complications.   DG CHEST PORT 1 VIEW  Result Date: 01/13/2022 CLINICAL DATA:  Post ICD EXAM: PORTABLE CHEST 1 VIEW COMPARISON:  05/16/2021 FINDINGS: Interim placement of left-sided pacing device with leads projecting over the right atrium, right ventricle and coronary sinus. No visible left pneumothorax. Cardiomegaly with vascular congestion and mild pulmonary edema. No pleural effusion. IMPRESSION: 1. Left-sided pacing device as above without visible pneumothorax 2. Cardiomegaly with vascular congestion and mild pulmonary edema Electronically Signed   By: Donavan Foil M.D.   On: 01/13/2022 18:04    Impression/Recommendations Assessment and Plan: * COVID-19 virus infection- (present on admission) Patient presenting with complaints of generalized malaise in the days preceding hospitalization, and found to exhibit hypoxia postoperatively as well as low-grade fevers on arrival to the unit here. COVID-19 PCR testing positive. Chest x-ray revealing patchy bilateral infiltrates.  That are consistent with COVID-19 infection although underlying concurrent bacterial pneumonia cannot be excluded. Airborne and contact isolation initiated. Providing patient with supplemental oxygen for bouts of hypoxia with target oxygen saturation of 94%. Patient was initially given a dose of Paxlovid but I have transitioned  the patient to intravenous remdesivir for the remainder of his 5-day course of therapy. Considering bout of hypoxia and oxygen requirement patient has been initiated on intravenous Solumedrol followed by Prednisone As needed bronchodilator therapy for shortness of breath and wheezing  As needed antitussives for cough  Hydrating patient gently with intravenous isotonic fluids  Following CRP, D-dimer,  CBC daily Blood cultures obtained Close clinical monitoring    Pneumonia of both lungs due to infectious organism- (present on admission) Considering patient has been found to have an elevated procalcitonin concurrent bacterial pneumonia must be considered Patient is additionally been initiated on intravenous ceftriaxone (switched from the typical post op regimen of Ancef) in addition to intravenous doxycycline Obtaining CT angiogram of the chest to evaluate for pulmonary embolism which can also secondarily evaluate the nature of the patient's pulmonary infiltrates Remainder of assessment and plan as above  Elevated d-dimer- (present on admission) Notably elevated D-dimer While this very well may be secondary to the patient's COVID-19 infection and/or superimposed bacterial infection pulmonary embolism must be ruled out considering patient recently discontinued his Xarelto several days ago. CT angiogram of the chest has been ordered and results will be followed up on  ICD (implantable cardioverter-defibrillator) in place- (present on admission) Successful placement of ICD by Dr. Quentin Ore on 2/13 We will defer postop management concerning this to EP/cardiology  Chronic systolic CHF (congestive heart failure) ([place])- (present on admission) I personally find clinical evidence of cardiogenic volume overload I do not believe that the patchy bilateral infiltrates on chest imaging are due to pulmonary edema Continuing home regimen of torsemide for now   Paroxysmal atrial fibrillation ([place])-  (present on admission) home regimen of Xarelto currently being held Continuing home regimen of amiodarone Rate currently controlled with patient normal sinus rhythm  Essential hypertension- (present on admission) Continue home regimen of  home antihypertensive therapy  Chronic kidney disease, stage 3a ([place])- (present on admission) Strict intake and output monitoring Creatinine near baseline Minimizing nephrotoxic agents as much as possible Serial chemistries to monitor renal function and electrolytes           Thank you for this consultation.  Our [facility] hospitalist team will follow the patient with you.   Time Spent: 50 minutes  Vernelle Emerald M.D. [facility] 01/14/2022, 6:25 AM

## 2022-01-13 NOTE — Discharge Instructions (Addendum)
Xarelto to be resumed on 2/20   Supplemental Discharge Instructions for  Pacemaker/Defibrillator Patients   Activity No heavy lifting or vigorous activity with your left/right arm for 6 to 8 weeks.  Do not raise your left/right arm above your head for one week.  Gradually raise your affected arm as drawn below.              01/17/22                    01/18/22                    01/19/22                  01/20/22 __  NO DRIVING for   1 week  ; you may begin driving on   1/47/82  .  WOUND CARE Keep the wound area clean and dry.  Do not get this area wet , no showers for one week; you may shower on  01/20/22   . The tape/steri-strips on your wound will fall off; do not pull them off.  No bandage is needed on the site.  DO  NOT apply any creams, oils, or ointments to the wound area. If you notice any drainage or discharge from the wound, any swelling or bruising at the site, or you develop a fever > 101? F after you are discharged home, call the office at once.  Special Instructions You are still able to use cellular telephones; use the ear opposite the side where you have your pacemaker/defibrillator.  Avoid carrying your cellular phone near your device. When traveling through airports, show security personnel your identification card to avoid being screened in the metal detectors.  Ask the security personnel to use the hand wand. Avoid arc welding equipment, MRI testing (magnetic resonance imaging), TENS units (transcutaneous nerve stimulators).  Call the office for questions about other devices. Avoid electrical appliances that are in poor condition or are not properly grounded. Microwave ovens are safe to be near or to operate.  Additional information for defibrillator patients should your device go off: If your device goes off ONCE and you feel fine afterward, notify the device clinic nurses. If your device goes off ONCE and you do not feel well afterward, call 911. If your  device goes off TWICE, call 911. If your device goes off THREE times in one day, call 911.  DO NOT DRIVE YOURSELF OR A FAMILY MEMBER WITH A DEFIBRILLATOR TO THE HOSPITAL--CALL 911.  You were cared for by a hospitalist during your hospital stay. If you have any questions about your discharge medications or the care you received while you were in the hospital after you are discharged, you can call the unit and asked to speak with the hospitalist on call if the hospitalist that took care of you is not available. Once you are discharged, your primary care physician will handle any further medical issues.   Please note that NO REFILLS for any discharge medications will be authorized once you are discharged, as it is imperative that you return to your primary care physician (or establish a relationship with a primary care physician if you do not have one) for your aftercare needs so that they can reassess your need for medications and monitor your lab values.  Please take all your medications with you for your next visit with your Primary MD. Please ask your Primary MD to get all  Hospital records sent to his/her office. Please request your Primary MD to go over all hospital test results at the follow up.   If you experience worsening of your admission symptoms, develop shortness of breath, chest pain, suicidal or homicidal thoughts or a life threatening emergency, you must seek medical attention immediately by calling 911 or calling your MD.   Dennis Bast must read the complete instructions/literature along with all the possible adverse reactions/side effects for all the medicines you take including new medications that have been prescribed to you. Take new medicines after you have completely understood and accpet all the possible adverse reactions/side effects.    Do not drive when taking pain medications or sedatives.     Do not take more than prescribed Pain, Sleep and Anxiety Medications   If you have  smoked or chewed Tobacco in the last 2 yrs please stop. Stop any regular alcohol  and or recreational drug use.   Wear Seat belts while driving.

## 2022-01-13 NOTE — H&P (Signed)
Electrophysiology Office Note:     Date:  01/13/2022    ID:  [name], DOB [birth year], MRN [id number]   PCP:  [name], MD          [facility] Cardiologist:  Fransico Him, MD  [facility] Electrophysiologist:  None    Referring MD: [name], MD    Chief Complaint: Chronic systolic heart failure   History of Present Illness:     [name] is a 56 y.o. male who presents for an evaluation of chronic systolic heart failure at the request of Dr. Aundra [name]. Their medical history includes chronic systolic heart failure secondary to nonischemic cardiomyopathy, prior alcohol abuse, tobacco abuse, history of pulmonary embolism, obstructive sleep apnea, morbid obesity.  He is here today with his family.   He last saw Dr. Aundra [name] on November 01, 2021.  NYHA class II-III.  He tells me he is working on improving his exercise capacity.  He walks on a treadmill daily, at least 15 minutes but sometimes has to stop because of shortness of breath.  Sometimes he has some dizziness spells but no frank syncope.  He is working on losing weight.  He is lost over 70 pounds and is planning on losing more.  He is achieving this by eating less.   He had a complicated admission in June 2022 with shortness of breath and acute heart failure.  He ultimately was intubated.  RV function was moderately reduced.  He had an episode of atrial fibrillation with rapid ventricular rates and had flash pulmonary edema.  He declined BiPAP and had respiratory distress and hypoxia.  He ultimately had a VT/VF arrest and had ROSC after 1 minute.   He does not drink alcohol anymore.  He is off of cigarettes and now vapes but he is trying to wean off of this as well.  He is working on improving his exercise capacity and is actively losing weight.   01/13/2022 - Today he is doing well. No new symptoms/complaints.   Objective        Past Medical History:  Diagnosis Date   Alcohol abuse     Anxiety state,  unspecified     Atrial fibrillation (HCC)     CHF (congestive heart failure) (HCC)     Chronic systolic heart failure (HCC)     Diabetes mellitus, type II (HCC)     Edema     Gout     History of medication noncompliance     Migraine     Obesity, unspecified     Obstructive sleep apnea     Psychiatric disorder     Pulmonary embolism (HCC)     Shortness of breath             Past Surgical History:  Procedure Laterality Date   CARDIAC CATHETERIZATION       CARDIAC CATHETERIZATION N/A 08/13/2015    Procedure: Right/Left Heart Cath and Coronary Angiography;  Surgeon: [name], MD;  Location: [place] [facility];  Service: Cardiovascular;  Laterality: N/A;   RIGHT/LEFT HEART CATH AND CORONARY ANGIOGRAPHY N/A 04/14/2017    Procedure: Right/Left Heart Cath and Coronary Angiography;  Surgeon: [name], MD;  Location: [place] [facility];  Service: Cardiovascular;  Laterality: N/A;   TESTICLE SURGERY          Current Medications: Active Medications      Current Meds  Medication Sig   albuterol (VENTOLIN HFA) 108 (90 Base)  MCG/ACT inhaler Inhale 1-2 puffs into the lungs every 4 (four) hours as needed for shortness of breath or wheezing.   amiodarone (PACERONE) 200 MG tablet Take 1 tablet (200 mg total) by mouth daily. Patient taking once daily   ARIPiprazole (ABILIFY) 10 MG tablet Take 1 tablet (10 mg total) by mouth daily.   atorvastatin (LIPITOR) 40 MG tablet Take 1 tablet (40 mg total) by mouth daily.   blood glucose meter kit and supplies KIT Use to check blood sugar. DX E11.9   buPROPion (WELLBUTRIN XL) 150 MG 24 hr tablet Take 1 tablet (150 mg total) by mouth daily.   carvedilol (COREG) 3.125 MG tablet Take 1 tablet (3.125 mg total) by mouth 2 (two) times daily.   clonazePAM (KLONOPIN) 1 MG tablet Take 1 tablet (1 mg total) by mouth 2 (two) times daily as needed for anxiety.   dapagliflozin propanediol (FARXIGA) 10 MG TABS tablet Take 1 tablet (10 mg total) by mouth  daily.   losartan (COZAAR) 25 MG tablet TAKE 0.5 TABLETS BY MOUTH AT BEDTIME.   pantoprazole (PROTONIX) 40 MG tablet TAKE 1 TABLET BY MOUTH EVERY DAY   potassium chloride SA (KLOR-CON) 20 MEQ tablet Take 2 tablets (40 mEq total) by mouth daily. (Patient taking differently: Take 40 mEq by mouth as needed (muscle cramps).)   PRESCRIPTION MEDICATION Inhale into the lungs See admin instructions. Bipap, pressure 16/12 with 2L of O2 - use whenever sleeping   sertraline (ZOLOFT) 100 MG tablet Take 1.5 tablets (150 mg total) by mouth daily.   sildenafil (VIAGRA) 50 MG tablet Take 1 tablet (50 mg total) by mouth daily as needed for erectile dysfunction (begin with one tablet --may take 2 if necessary).   spironolactone (ALDACTONE) 25 MG tablet Take 1 tablet (25 mg total) by mouth daily.   thiamine 100 MG tablet Take 1 tablet (100 mg total) by mouth daily.   torsemide (DEMADEX) 20 MG tablet Take 2 tablets (40 mg total) by mouth daily.   XARELTO 20 MG TABS tablet TAKE 1 TABLET BY MOUTH EVERY DAY        Allergies:   Ace inhibitors, Bidil [isosorb dinitrate-hydralazine], Digoxin and related, and Buspirone    Social History         Socioeconomic History   Marital status: Married      Spouse name: Not on file   Number of children: 3   Years of education: Not on file   Highest education level: Not on file  Occupational History   Occupation: DISABLED  Tobacco Use   Smoking status: Former      Packs/day: 0.50      Years: 30.00      Pack years: 15.00      Types: Cigarettes      Quit date: 05/15/2020      Years since quitting: 1.5   Smokeless tobacco: Never   Tobacco comments:      vaping - nicotine-free products  Vaping Use   Vaping Use: Never used  Substance and Sexual Activity   Alcohol use: Not Currently      Alcohol/week: 0.0 standard drinks   Drug use: No   Sexual activity: Not Currently  Other Topics Concern   Not on file  Social History Narrative    He smokes about a pack per day  and he has been smoking since he was 56 years of age.  He drinks alcohol occasionally, but he denies any illicit drug abuse.  He is presently on disability.  Lives with wife in a 2 story home.  Has 2 children.    Previously worked in [employer], last worked in 1998.    Highest level of education:  11th grade               Social Determinants of Health       Financial Resource Strain: Low Risk    Difficulty of Paying Living Expenses: Not hard at all  Food Insecurity: No Food Insecurity   Worried About Charity fundraiser in the Last Year: Never true   Ran Out of Food in the Last Year: Never true  Transportation Needs: No Transportation Needs   Lack of Transportation (Medical): No   Lack of Transportation (Non-Medical): No  Physical Activity: Sufficiently Active   Days of Exercise per Week: 5 days   Minutes of Exercise per Session: 30 min  Stress: No Stress Concern Present   Feeling of Stress : Not at all  Social Connections: Moderately Isolated   Frequency of Communication with Friends and Family: More than three times a week   Frequency of Social Gatherings with Friends and Family: Never   Attends Religious Services: Never   Marine scientist or Organizations: No   Attends Music therapist: Never   Marital Status: Married      Family History: The patient's family history includes Cancer in his mother; Diabetes in his daughter, father, and another family member; Heart disease in his maternal grandmother; Hypertension in his mother and another family member; Stroke in an other family member.   ROS:   Please see the history of present illness.    All other systems reviewed and are negative.   EKGs/Labs/Other Studies Reviewed:     The following studies were reviewed today:   Heart failure clinic records  May 16, 2021 transesophageal echo No LV thrombus LV function less than 20% RV function mildly reduced Dilated left atrium Mild MR   EKG:  The ekg  ordered today demonstrates left bundle branch block     Recent Labs: 05/19/2021: Magnesium 2.0 07/08/2021: Pro B Natriuretic peptide (BNP) 142.0 07/19/2021: B Natriuretic Peptide 98.3 11/01/2021: ALT 25; BUN 17; Creatinine, Ser 1.60; Hemoglobin 16.6; Platelets 227; Potassium 3.7; Sodium 139; TSH 2.183  Recent Lipid Panel Labs (Brief)          Component Value Date/Time    CHOL 157 02/22/2021 1211    TRIG 221 (H) 05/14/2021 0226    HDL 37 (L) 02/22/2021 1211    CHOLHDL 4.2 02/22/2021 1211    VLDL 19 02/22/2021 1211    LDLCALC 101 (H) 02/22/2021 1211    LDLDIRECT 114.0 03/21/2020 1455        Physical Exam:     VS:  BP 110/64    Pulse 70    Ht _0  (1.753 m)    Wt (!) 370 lb 12.8 oz (168.2 kg)    SpO2 97%    BMI 54.76 kg/m         Wt Readings from Last 3 Encounters:  11/28/21 (!) 370 lb 12.8 oz (168.2 kg)  11/01/21 (!) 373 lb 12.8 oz (169.6 kg)  07/29/21 (!) 385 lb 3.2 oz (174.7 kg)      GEN:  Well nourished, well developed in no acute distress.  Morbidly obese HEENT: Normal NECK: No JVD; No carotid bruits LYMPHATICS: No lymphadenopathy CARDIAC: RRR, no murmurs, rubs, gallops RESPIRATORY:  Clear to auscultation without rales, wheezing or rhonchi  ABDOMEN: Soft, non-tender, non-distended  MUSCULOSKELETAL:  No edema; No deformity  SKIN: Warm and dry NEUROLOGIC:  Alert and oriented x 3 PSYCHIATRIC:  Normal affect          Assessment     ASSESSMENT:     1. Chronic combined systolic and diastolic CHF (congestive heart failure) (HCC)   2. AF (paroxysmal atrial fibrillation) ([place])   3. OSA treated with BiPAP   4. History of ventricular tachycardia     PLAN:     In order of problems listed above:   #Chronic combined systolic and diastolic heart failure NYHA class II-III.  Warm and relatively euvolemic on exam today.  On good medical therapy with Coreg, Farxiga, Cozaar, spironolactone and torsemide. The patient has a left bundle branch block and persistently reduced  left ventricular function.  I do think he would overall benefit from resynchronization therapy with ICD.  He also has a history of VT/VF arrest in the setting of hypoxia.     I discussed the CRT-D implant procedure in detail during today's visit.  I discussed the risks, potential benefits and recovery.  Given his body mass and history of difficulty with anesthesia, we will plan to perform the procedure with our anesthesia colleagues support.  I will defer to them whether or not they would like to do general anesthesia or MAC.  If they think it would be lower risk, I am okay with doing the case under MAC sedation.  May need arterial blood pressure monitoring during the case.  We will plan for a [facility] Jude CRT-D.   The patient has an nonischemic CM (EF <20%), NYHA Class III CHF, and CAD.  He is referred by Dr [name] for risk stratification of sudden death and consideration of ICD implantation.  At this time, he meets criteria for ICD implantation for primary prevention of sudden death.  I have had a thorough discussion with the patient reviewing options.  The patient and their family (if available) have had opportunities to ask questions and have them answered. The patient and I have decided together through a shared decision making process to proceed with ICD implant at this time.     Risks, benefits, alternatives to ICD implantation were discussed in detail with the patient today. The patient understands that the risks include but are not limited to bleeding, infection, pneumothorax, perforation, tamponade, vascular damage, renal failure, MI, stroke, death, inappropriate shocks, and lead dislodgement and wishes to proceed.  We will therefore schedule device implantation at the next available time.  We did discuss how his significantly reduced left ventricular function puts him at an increased risk of complication.    #Paroxysmal atrial fibrillation In sinus rhythm today.  Will need to hold his Xarelto  for 3 days prior to the implant.  #Obstructive sleep apnea on BiPAP Encouraged continued BiPAP use  #History of ventricular tachycardia Has a primary prevention indication for ICD therapy despite his history of VT/VF in the setting of hypoxia.  I do think his episode of VT/VF while hospitalized was due to a seemingly reversible cause.             Total time spent with patient today 50 minutes. This includes reviewing records, evaluating the patient and coordinating care.   Medication Adjustments/Labs and Tests Ordered: Current medicines are reviewed at length with the patient today.  Concerns regarding medicines are outlined above.  No orders of the defined types were placed in this encounter.   No orders of the defined types were placed  in this encounter.       Signed, Hilton Cork. Quentin Ore, MD, [facility], [facility] 11/28/2021 7:48 PM    Electrophysiology [facility]        I have seen, examined the patient, and reviewed the above assessment and plan.    Plan for CRT-D today. Procedure reviewed.   Vickie Epley, MD 01/13/2022 10:48 AM

## 2022-01-13 NOTE — Anesthesia Postprocedure Evaluation (Signed)
Anesthesia Post Note  Patient: [name]  Procedure(s) Performed: BIV ICD INSERTION CRT-D     Patient location during evaluation: PACU Anesthesia Type: General Level of consciousness: awake and alert Pain management: pain level controlled Vital Signs Assessment: post-procedure vital signs reviewed and stable Respiratory status: spontaneous breathing and respiratory function stable (Pt comfortable on BiPAP) Cardiovascular status: blood pressure returned to baseline and stable Postop Assessment: no apparent nausea or vomiting Anesthetic complications: no Comments: RR 22. Sat 97% on BiPAP. Pt comfortable and in NAD. BP in the 120's. PT to go to [facility] stepdown for admission.   No notable events documented.  Last Vitals:  Vitals:   01/13/22 1535 01/13/22 1540  BP: 108/77 120/73  Pulse: 85 94  Resp: (!) 21 15  Temp:    SpO2: 95% 97%    Last Pain:  Vitals:   01/13/22 1432  TempSrc: Temporal  PainSc: 0-No pain                 Tiajuana Amass

## 2022-01-13 NOTE — Progress Notes (Signed)
Pt transported to the holding area for recovery post procedure. Pt presented with rapid and shallow breathing. RR were between 44-52. CRNA at bedside with patient. Patient also seemed very cold and was shivering. Md Quentin Ore called to bedside as well as MD Ola Spurr with anesthesia. Orders given to place patient on Bipap. Orders also given to start a nitro drip and give 40 mg IV lasix.

## 2022-01-13 NOTE — Progress Notes (Signed)
Pt transported on V60 from cath lab to [facility].  No issues.

## 2022-01-13 NOTE — Transfer of Care (Signed)
Immediate Anesthesia Transfer of Care Note  Patient: [name]  Procedure(s) Performed: BIV ICD INSERTION CRT-D  Patient Location: Cath Lab  Anesthesia Type:General  Level of Consciousness: awake  Airway & Oxygen Therapy: Patient Spontanous Breathing, connected to Bipap, anesthesiologist at bedside for tachypnea. VSS.  Post-op Assessment: Report given to RN and Post -op Vital signs reviewed and stable  Post vital signs: Reviewed and stable  Last Vitals:  Vitals Value Taken Time  BP 112/73 01/13/22 1509  Temp 36.2 C 01/13/22 1432  Pulse 94 01/13/22 1515  Resp 23 01/13/22 1515  SpO2 97 % 01/13/22 1515  Vitals shown include unvalidated device data.  Last Pain:  Vitals:   01/13/22 1432  TempSrc: Temporal  PainSc: 0-No pain      Patients Stated Pain Goal: 3 (09/16/50 0258)  Complications: No notable events documented.

## 2022-01-13 NOTE — Anesthesia Procedure Notes (Signed)
Procedure Name: Intubation Date/Time: 01/13/2022 12:28 PM Performed by: Barrington Ellison, CRNA Pre-anesthesia Checklist: Patient identified, Emergency Drugs available, Suction available and Patient being monitored Patient Re-evaluated:Patient Re-evaluated prior to induction Oxygen Delivery Method: Circle System Utilized Preoxygenation: Pre-oxygenation with 100% oxygen Induction Type: IV induction Ventilation: Mask ventilation without difficulty, Two handed mask ventilation required and Oral airway inserted - appropriate to patient size Laryngoscope Size: Mac and 3 Grade View: Grade I Tube type: Oral Tube size: 8.0 mm Number of attempts: 1 Airway Equipment and Method: Stylet and Oral airway Placement Confirmation: ETT inserted through vocal cords under direct vision, positive ETCO2 and breath sounds checked- equal and bilateral Secured at: 23 cm Tube secured with: Tape Dental Injury: Teeth and Oropharynx as per pre-operative assessment  Comments: LMA 5 inserted, TV low and unable to ventilate. Replaced with ETT-Grade 1 view

## 2022-01-14 ENCOUNTER — Encounter (HOSPITAL_COMMUNITY): Payer: Self-pay | Admitting: Cardiology

## 2022-01-14 ENCOUNTER — Inpatient Hospital Stay (HOSPITAL_COMMUNITY): Payer: Medicare HMO

## 2022-01-14 ENCOUNTER — Ambulatory Visit (HOSPITAL_COMMUNITY): Payer: Medicare HMO

## 2022-01-14 DIAGNOSIS — G4733 Obstructive sleep apnea (adult) (pediatric): Secondary | ICD-10-CM | POA: Diagnosis present

## 2022-01-14 DIAGNOSIS — J9601 Acute respiratory failure with hypoxia: Secondary | ICD-10-CM | POA: Diagnosis not present

## 2022-01-14 DIAGNOSIS — Z6841 Body Mass Index (BMI) 40.0 and over, adult: Secondary | ICD-10-CM | POA: Diagnosis not present

## 2022-01-14 DIAGNOSIS — J1282 Pneumonia due to coronavirus disease 2019: Secondary | ICD-10-CM | POA: Diagnosis not present

## 2022-01-14 DIAGNOSIS — J9811 Atelectasis: Secondary | ICD-10-CM | POA: Diagnosis not present

## 2022-01-14 DIAGNOSIS — Z7901 Long term (current) use of anticoagulants: Secondary | ICD-10-CM | POA: Diagnosis not present

## 2022-01-14 DIAGNOSIS — Z888 Allergy status to other drugs, medicaments and biological substances status: Secondary | ICD-10-CM | POA: Diagnosis not present

## 2022-01-14 DIAGNOSIS — R7989 Other specified abnormal findings of blood chemistry: Secondary | ICD-10-CM | POA: Diagnosis present

## 2022-01-14 DIAGNOSIS — Z87891 Personal history of nicotine dependence: Secondary | ICD-10-CM | POA: Diagnosis not present

## 2022-01-14 DIAGNOSIS — U071 COVID-19: Secondary | ICD-10-CM | POA: Insufficient documentation

## 2022-01-14 DIAGNOSIS — J811 Chronic pulmonary edema: Secondary | ICD-10-CM | POA: Diagnosis not present

## 2022-01-14 DIAGNOSIS — F411 Generalized anxiety disorder: Secondary | ICD-10-CM | POA: Diagnosis present

## 2022-01-14 DIAGNOSIS — I130 Hypertensive heart and chronic kidney disease with heart failure and stage 1 through stage 4 chronic kidney disease, or unspecified chronic kidney disease: Secondary | ICD-10-CM | POA: Diagnosis not present

## 2022-01-14 DIAGNOSIS — J189 Pneumonia, unspecified organism: Secondary | ICD-10-CM | POA: Diagnosis present

## 2022-01-14 DIAGNOSIS — Z86711 Personal history of pulmonary embolism: Secondary | ICD-10-CM | POA: Diagnosis not present

## 2022-01-14 DIAGNOSIS — Z833 Family history of diabetes mellitus: Secondary | ICD-10-CM | POA: Diagnosis not present

## 2022-01-14 DIAGNOSIS — I5042 Chronic combined systolic (congestive) and diastolic (congestive) heart failure: Secondary | ICD-10-CM | POA: Diagnosis not present

## 2022-01-14 DIAGNOSIS — I428 Other cardiomyopathies: Secondary | ICD-10-CM | POA: Diagnosis not present

## 2022-01-14 DIAGNOSIS — Z8249 Family history of ischemic heart disease and other diseases of the circulatory system: Secondary | ICD-10-CM | POA: Diagnosis not present

## 2022-01-14 DIAGNOSIS — Z79899 Other long term (current) drug therapy: Secondary | ICD-10-CM | POA: Diagnosis not present

## 2022-01-14 DIAGNOSIS — I517 Cardiomegaly: Secondary | ICD-10-CM | POA: Diagnosis not present

## 2022-01-14 DIAGNOSIS — E1122 Type 2 diabetes mellitus with diabetic chronic kidney disease: Secondary | ICD-10-CM | POA: Diagnosis not present

## 2022-01-14 DIAGNOSIS — I480 Paroxysmal atrial fibrillation: Secondary | ICD-10-CM | POA: Diagnosis not present

## 2022-01-14 DIAGNOSIS — N1831 Chronic kidney disease, stage 3a: Secondary | ICD-10-CM | POA: Diagnosis present

## 2022-01-14 DIAGNOSIS — M109 Gout, unspecified: Secondary | ICD-10-CM | POA: Diagnosis present

## 2022-01-14 DIAGNOSIS — I509 Heart failure, unspecified: Secondary | ICD-10-CM | POA: Diagnosis not present

## 2022-01-14 DIAGNOSIS — Z7984 Long term (current) use of oral hypoglycemic drugs: Secondary | ICD-10-CM | POA: Diagnosis not present

## 2022-01-14 LAB — CBC WITH DIFFERENTIAL/PLATELET
Abs Immature Granulocytes: 0.05 10*3/uL (ref 0.00–0.07)
Basophils Absolute: 0 10*3/uL (ref 0.0–0.1)
Basophils Relative: 0 %
Eosinophils Absolute: 0 10*3/uL (ref 0.0–0.5)
Eosinophils Relative: 0 %
HCT: 40.7 % (ref 39.0–52.0)
Hemoglobin: 14.8 g/dL (ref 13.0–17.0)
Immature Granulocytes: 1 %
Lymphocytes Relative: 18 %
Lymphs Abs: 1.6 10*3/uL (ref 0.7–4.0)
MCH: 30.5 pg (ref 26.0–34.0)
MCHC: 36.4 g/dL — ABNORMAL HIGH (ref 30.0–36.0)
MCV: 83.9 fL (ref 80.0–100.0)
Monocytes Absolute: 1.6 10*3/uL — ABNORMAL HIGH (ref 0.1–1.0)
Monocytes Relative: 19 %
Neutro Abs: 5.4 10*3/uL (ref 1.7–7.7)
Neutrophils Relative %: 62 %
Platelets: 195 10*3/uL (ref 150–400)
RBC: 4.85 MIL/uL (ref 4.22–5.81)
RDW: 14.3 % (ref 11.5–15.5)
WBC: 8.7 10*3/uL (ref 4.0–10.5)
nRBC: 0 % (ref 0.0–0.2)

## 2022-01-14 LAB — COMPREHENSIVE METABOLIC PANEL
ALT: 26 U/L (ref 0–44)
AST: 28 U/L (ref 15–41)
Albumin: 3.4 g/dL — ABNORMAL LOW (ref 3.5–5.0)
Alkaline Phosphatase: 107 U/L (ref 38–126)
Anion gap: 9 (ref 5–15)
BUN: 22 mg/dL — ABNORMAL HIGH (ref 6–20)
CO2: 23 mmol/L (ref 22–32)
Calcium: 9 mg/dL (ref 8.9–10.3)
Chloride: 106 mmol/L (ref 98–111)
Creatinine, Ser: 1.48 mg/dL — ABNORMAL HIGH (ref 0.61–1.24)
GFR, Estimated: 56 mL/min — ABNORMAL LOW (ref >60)
Glucose, Bld: 94 mg/dL (ref 70–99)
Potassium: 3.8 mmol/L (ref 3.5–5.1)
Sodium: 138 mmol/L (ref 135–145)
Total Bilirubin: 0.6 mg/dL (ref 0.3–1.2)
Total Protein: 7 g/dL (ref 6.5–8.1)

## 2022-01-14 LAB — D-DIMER, QUANTITATIVE: D-Dimer, Quant: 3.29 ug/mL-FEU — ABNORMAL HIGH (ref 0.00–0.50)

## 2022-01-14 LAB — MAGNESIUM: Magnesium: 2 mg/dL (ref 1.7–2.4)

## 2022-01-14 MED ORDER — SODIUM CHLORIDE 0.9 % IV SOLN
200.0000 mg | Freq: Once | INTRAVENOUS | Status: DC
  Filled 2022-01-14: qty 40

## 2022-01-14 MED ORDER — SODIUM CHLORIDE 0.9 % IV SOLN
100.0000 mg | Freq: Two times a day (BID) | INTRAVENOUS | Status: DC
  Administered 2022-01-14 – 2022-01-15 (×4): 100 mg via INTRAVENOUS
  Filled 2022-01-14 (×5): qty 100

## 2022-01-14 MED ORDER — PREDNISONE 50 MG PO TABS: 50.0000 mg | ORAL_TABLET | Freq: Every day | ORAL | Status: DC

## 2022-01-14 MED ORDER — ALBUTEROL SULFATE HFA 108 (90 BASE) MCG/ACT IN AERS
2.0000 | INHALATION_SPRAY | RESPIRATORY_TRACT | Status: DC | PRN
  Filled 2022-01-14: qty 6.7

## 2022-01-14 MED ORDER — METHYLPREDNISOLONE SODIUM SUCC 125 MG IJ SOLR
0.5000 mg/kg | Freq: Two times a day (BID) | INTRAMUSCULAR | Status: DC
  Administered 2022-01-14 – 2022-01-16 (×5): 85 mg via INTRAVENOUS
  Filled 2022-01-14 (×5): qty 2

## 2022-01-14 MED ORDER — CLONAZEPAM 0.5 MG PO TABS
1.0000 mg | ORAL_TABLET | Freq: Two times a day (BID) | ORAL | Status: DC | PRN
  Administered 2022-01-14 – 2022-01-15 (×2): 1 mg via ORAL
  Filled 2022-01-14 (×2): qty 2

## 2022-01-14 MED ORDER — SODIUM CHLORIDE 0.9 % IV SOLN
100.0000 mg | Freq: Every day | INTRAVENOUS | Status: DC
  Administered 2022-01-15 – 2022-01-16 (×2): 100 mg via INTRAVENOUS
  Filled 2022-01-14 (×2): qty 20

## 2022-01-14 MED ORDER — IOHEXOL 350 MG/ML SOLN
80.0000 mL | Freq: Once | INTRAVENOUS | Status: AC | PRN
  Administered 2022-01-14: 80 mL via INTRAVENOUS

## 2022-01-14 MED ORDER — RIVAROXABAN 20 MG PO TABS
20.0000 mg | ORAL_TABLET | Freq: Every day | ORAL | Status: DC
  Filled 2022-01-14: qty 1

## 2022-01-14 MED ORDER — SODIUM CHLORIDE 0.9 % IV SOLN
2.0000 g | Freq: Every day | INTRAVENOUS | Status: DC
  Administered 2022-01-14 – 2022-01-16 (×3): 2 g via INTRAVENOUS
  Filled 2022-01-14 (×3): qty 20

## 2022-01-14 NOTE — Progress Notes (Signed)
Progress Note  Patient Name: [name] Date of Encounter: 01/14/2022  [facility] Cardiologist: Fransico Him, MD  AHF: Dr. Aundra Dubin EP: FDr. lambert  Subjective   Feeling much better today, denies any SOB, implant site aches a little  Inpatient Medications    Scheduled Meds:  acetaminophen  1,000 mg Oral Q8H   amiodarone  200 mg Oral Daily   vitamin C  500 mg Oral Daily   buPROPion  150 mg Oral Daily   carvedilol  3.125 mg Oral BID WC   dapagliflozin propanediol  10 mg Oral Daily   methylPREDNISolone (SOLU-MEDROL) injection  0.5 mg/kg Intravenous BID   Followed by   Derrill Memo ON 01/17/2022] predniSONE  50 mg Oral Daily   sertraline  150 mg Oral Daily   spironolactone  25 mg Oral Daily   torsemide  20 mg Oral BID   zinc sulfate  220 mg Oral Daily   Continuous Infusions:  cefTRIAXone (ROCEPHIN)  IV 2 g (01/14/22 4010)   doxycycline (VIBRAMYCIN) IV     remdesivir 200 mg in sodium chloride 0.9% 250 mL IVPB     Followed by   Derrill Memo ON 01/15/2022] remdesivir 100 mg in NS 100 mL     PRN Meds: acetaminophen, albuterol, guaiFENesin-dextromethorphan, ondansetron (ZOFRAN) IV   Vital Signs    Vitals:   01/13/22 2359 01/14/22 0218 01/14/22 0308 01/14/22 0816  BP: 108/85  132/88 120/80  Pulse: 73 73 73 74  Resp: 19 19 12 19   Temp: 99.8 F (37.7 C)  (!) 100.7 F (38.2 C) 97.9 F (36.6 C)  TempSrc: Oral  Oral Oral  SpO2: 95% 95% 100% 96%  Weight:      Height:        Intake/Output Summary (Last 24 hours) at 01/14/2022 1032 Last data filed at 01/14/2022 0308 Gross per 24 hour  Intake 612.88 ml  Output 970 ml  Net -357.12 ml   Last 3 Weights 01/13/2022 01/07/2022 11/28/2021  Weight (lbs) 375 lb 375 lb 3.2 oz 370 lb 12.8 oz  Weight (kg) 170.099 kg 170.19 kg 168.194 kg  Some encounter information is confidential and restricted. Go to Review Flowsheets activity to see all data.      Telemetry    SR/ V paced, AV pacing- Personally Reviewed  ECG    AV paced 73  -  Personally Reviewed  Physical Exam   GEN: No acute distress.   Neck: No JVD Cardiac: RRR, no murmurs, rubs, or gallops.  Respiratory: slightly diminished at the bases. GI: Soft, nontender, non-distended  MS: No edema; No deformity. Neuro:  Nonfocal  Psych: Normal affect   ICD site is stable, no bleeding, no hematoma  Labs    High Sensitivity Troponin:  No results for input(s): TROPONINIHS in the last 720 hours.   Chemistry Recent Labs  Lab 01/07/22 1214 01/13/22 2220 01/14/22 0415  NA 140 139 138  K 3.5 3.3* 3.8  CL 109 106 106  CO2 22 23 23   GLUCOSE 109* 139* 94  BUN 16 23* 22*  CREATININE 1.22 1.71* 1.48*  CALCIUM 8.7* 8.3* 9.0  MG  --   --  2.0  PROT  --  6.7 7.0  ALBUMIN  --  3.3* 3.4*  AST  --  28 28  ALT  --  28 26  ALKPHOS  --  105 107  BILITOT  --  0.6 0.6  GFRNONAA >60 47* 56*  ANIONGAP 9 10 9     Lipids  Recent  Labs  Lab 01/07/22 1214  CHOL 142  TRIG 133  HDL 40*  LDLCALC 75  CHOLHDL 3.6    Hematology Recent Labs  Lab 01/07/22 1214 01/13/22 2220 01/14/22 0415  WBC 8.1 9.2 8.7  RBC 4.92 4.69 4.85  HGB 14.9 14.3 14.8  HCT 41.2 39.3 40.7  MCV 83.7 83.8 83.9  MCH 30.3 30.5 30.5  MCHC 36.2* 36.4* 36.4*  RDW 14.0 14.3 14.3  PLT 231 192 195   Thyroid No results for input(s): TSH, FREET4 in the last 168 hours.  BNP Recent Labs  Lab 01/13/22 2220  BNP 292.4*    DDimer  Recent Labs  Lab 01/13/22 2220 01/14/22 0415  DDIMER 2.42* 3.29*     Radiology    DG Chest 2 View Result Date: 01/14/2022 CLINICAL DATA:  COVID positive, ICD EXAM: CHEST - 2 VIEW COMPARISON:  Chest radiograph from one day prior. FINDINGS: Cervical configuration of 3 lead left subclavian ICD with lead tips overlying the right atrium, right ventricle and coronary sinus. Stable cardiomediastinal silhouette with mild cardiomegaly. No pneumothorax. No pleural effusion. Low lung volumes. Borderline mild pulmonary edema, improved. Stable mild left basilar atelectasis.  IMPRESSION: 1. Borderline mild congestive heart failure, improved. 2. Stable mild left basilar atelectasis. Electronically Signed   By: Ilona Sorrel M.D.   On: 01/14/2022 08:33    Cardiac Studies    [facility] 08/13/15 showed no significant coronary disease; RA mean 6, PA 33/11 mean 23, PCWP mean 13, Fick CO/CI 4.75 /1.68 (difficult study, radial artery spasm, if needs future cath would use groin).  Echo (9/16) showed EF 20-25%.   - Echo (3/18): EF 25-30%, moderate LAE - Echo 4/18 EF 15% - Echo 7/19 EF 20-25% - Echo 2/21 with EF 20-25%, severe LV dilation.  - Echo 6/22 with EF < 20%, normal RV.  Patient Profile     56 y.o. male w/PMHx of NICM, PE (remotely and completed tx)morbid obesity, OSA, smoker, chronic CHF (systolic), PVCs, VT, AFib, anxiety admitted to undergo ICD implant  Assessment & Plan    CRT-D implanted yesterday Post extubation with tachypnea respiratory distress Site is stable, no bleeding or hematoma Device check this Am with stable measurements CXR yesterday and today with no PTX Wound care and activity restrictions were reviewed with the patient  EP follow up is in place  NICM Chronic CHF (systolic) Flash pulmonary edema post implant Volume status is much better Respiratory insufficiency, post extubation Subsequently COVID + Weaned off NTG gtt last evening VSS Off BIPAP (wears at baseline for HS) Low grade temps Appreciate IM help with COVID management, in d/w Dr. Benny Lennert, they will take on attending We will continue to follow  Given he is s/p new ICD implant please DO NOT USE any kind of heparin products    Paroxysmal Afib CHA2DS2Vasc is 4, on Xarelto outpatient Amiodarone chronicically for AF and VT NO XARELTO for 5 days starting today, plan to resume 01/20/22 pending pocket stability    For questions or updates, please contact [facility] Please consult [email or web address] for contact info under        Signed, Baldwin Jamaica, PA-C  01/14/2022,  10:32 AM

## 2022-01-14 NOTE — Assessment & Plan Note (Addendum)
·   Notably elevated D-dimer  Due to COVID-19 infection and/or superimposed bacterial infection pulmonary embolism must be ruled out considering patient recently discontinued his Xarelto several days ago.  CT angiogram of the chest has ruled out PE.  Continue anticoagulation with xarelto.

## 2022-01-14 NOTE — Assessment & Plan Note (Addendum)
-   Resolved.  -Patient required BIPAP on 2/14 after AICD.

## 2022-01-14 NOTE — Progress Notes (Signed)
PROGRESS NOTE  [name] PZW:[id number] DOB: [birth year] DOA: 01/13/2022 PCP: [name], MD  Brief History   56 year old male with past medical history of V-fib arrest (05/2021, no ICD at the time), nonischemic cardiomyopathy (cath 03/2017 with no CAD) with systolic congestive heart failure (Echo 05/2021 EF <20%), prior history of EtOH abuse, pulmonary embolism 03/2014 status post course of Xarelto, class III obesity (BMI 55), obstructive sleep apnea (on BIPAP QHS), nicotine dependence, hypertension and paroxysmal atrial fibrillation status post DCCV 05/2021 who presented to and was admitted to the electrophysiology service earlier today (2/13) for ICD insertion.   Patient underwent successful placement of a [employer] Jude CRT-D device.  Patient was initially placed on BiPAP therapy post procedure and has now been transferred to [facility] while being transitioned to nasal cannula.   Patient was identified to be COVID-positive via PCR testing this afternoon.  The hospitalist group has been consulted to evaluate the patient, determine the clinical significance of this positive test and assist with management.   Per my discussion with the patient he reports that in the days preceding his admission to the hospital his shortness of breath has not deviated from his baseline nor has he developed a new cough.  He states that he has not had any fevers preceding his hospitalization.  He reports that his daughter has been ill for the past several days with respiratory symptoms.    The patient is now on [facility] service.  Consultants  Cardiology.  Procedures  Placement of AICD by cardiology on 01/13/2022.  Antibiotics   Anti-infectives (From admission, onward)    Start     Dose/Rate Route Frequency Ordered Stop   01/15/22 1000  remdesivir 100 mg in sodium chloride 0.9 % 100 mL IVPB       See Hyperspace for full Linked Orders Report.   100 mg 200 mL/hr over 30 Minutes Intravenous Daily 01/14/22 0611  01/19/22 0959   01/14/22 0700  remdesivir 200 mg in sodium chloride 0.9% 250 mL IVPB       See Hyperspace for full Linked Orders Report.   200 mg 580 mL/hr over 30 Minutes Intravenous Once 01/14/22 0611     01/14/22 0600  cefTRIAXone (ROCEPHIN) 2 g in sodium chloride 0.9 % 100 mL IVPB        2 g 200 mL/hr over 30 Minutes Intravenous Daily 01/14/22 0536     01/14/22 0600  doxycycline (VIBRAMYCIN) 100 mg in sodium chloride 0.9 % 250 mL IVPB        100 mg 125 mL/hr over 120 Minutes Intravenous 2 times daily 01/14/22 0536     01/13/22 2245  nirmatrelvir/ritonavir EUA (PAXLOVID) 3 tablet  Status:  Discontinued        3 tablet Oral 2 times daily 01/13/22 2146 01/14/22 0608   01/13/22 2030  ceFAZolin (ANCEF) IVPB 2g/100 mL premix  Status:  Discontinued        2 g 200 mL/hr over 30 Minutes Intravenous Every 8 hours 01/13/22 1708 01/14/22 0535   01/13/22 1130  gentamicin (GARAMYCIN) 80 mg in sodium chloride 0.9 % 500 mL irrigation        80 mg Irrigation On call 01/13/22 0948 01/13/22 1353   01/13/22 1130  ceFAZolin (ANCEF) IVPB 3g/100 mL premix        3 g 200 mL/hr over 30 Minutes Intravenous On call 01/13/22 0948 01/13/22 1235      Interval History/Subjective  The patient is sitting up in a chair. No  new complaints.  Objective   Vitals:  Vitals:   01/14/22 0816 01/14/22 1211  BP: 120/80   Pulse: 74 73  Resp: 19   Temp: 97.9 F (36.6 C) 98 F (36.7 C)  SpO2: 96% 96%    Exam:  Constitutional:  The patient is awake, alert, and oriented x 3. No acute distress. Respiratory:  No increased work of breathing. No wheezes, rales, or rhonchi No tactile fremitus Cardiovascular:  Regular rate and rhythm No murmurs, ectopy, or gallups. No lateral PMI. No thrills. Abdomen:  Abdomen is soft, non-tender, non-distended No hernias, masses, or organomegaly Normoactive bowel sounds.  Musculoskeletal:  No cyanosis, clubbing, or edema Skin:  No rashes, lesions, ulcers palpation of  skin: no induration or nodules Neurologic:  CN 2-12 intact Sensation all 4 extremities intact Psychiatric:  Mental status Mood, affect appropriate Orientation to person, place, time  judgment and insight appear intact  I have personally reviewed the following:   Today's Data  Vitals  Lab Data  CMP CBC  Micro Data    Imaging  CTA chest   Cardiology Data  EKG  Other Data    Scheduled Meds:  acetaminophen  1,000 mg Oral Q8H   amiodarone  200 mg Oral Daily   vitamin C  500 mg Oral Daily   buPROPion  150 mg Oral Daily   carvedilol  3.125 mg Oral BID WC   dapagliflozin propanediol  10 mg Oral Daily   methylPREDNISolone (SOLU-MEDROL) injection  0.5 mg/kg Intravenous BID   Followed by   Derrill Memo ON 01/17/2022] predniSONE  50 mg Oral Daily   rivaroxaban  20 mg Oral Daily   sertraline  150 mg Oral Daily   spironolactone  25 mg Oral Daily   torsemide  20 mg Oral BID   zinc sulfate  220 mg Oral Daily   Continuous Infusions:  cefTRIAXone (ROCEPHIN)  IV 2 g (01/14/22 6195)   doxycycline (VIBRAMYCIN) IV 100 mg (01/14/22 1054)   remdesivir 200 mg in sodium chloride 0.9% 250 mL IVPB     Followed by   Derrill Memo ON 01/15/2022] remdesivir 100 mg in NS 100 mL      Principal Problem:   Pneumonia due to COVID-19 virus Active Problems:   Essential hypertension   Chronic combined systolic and diastolic CHF (congestive heart failure) (HCC)   Type II diabetes mellitus with manifestations (HCC)   Pneumonia of both lungs due to infectious organism   Paroxysmal atrial fibrillation (HCC)   Respiratory failure ([place])   ICD (implantable cardioverter-defibrillator) in place   Elevated d-dimer   Chronic kidney disease, stage 3a ([place])   LOS: 0 days   A & P  Assessment and Plan: * Pneumonia due to COVID-19 virus- (present on admission) Patient presenting with complaints of generalized malaise in the days preceding hospitalization, and found to exhibit hypoxia postoperatively as well as  low-grade fevers on arrival to the unit here. COVID-19 PCR testing positive. Chest x-ray revealing patchy bilateral infiltrates.  That are consistent with COVID-19 infection although underlying concurrent bacterial pneumonia cannot be excluded. CTA chest has ruled out PE and again demonstrated patchy bilateral infiltrates consistent with COVID pneumonia. Airborne and contact isolation initiated. Providing patient with supplemental oxygen for bouts of hypoxia with target oxygen saturation of 94%. Patient was initially given a dose of Paxlovid but I have transitioned the patient to intravenous remdesivir for the remainder of his 5-day course of therapy. Considering bout of hypoxia and oxygen requirement patient has been initiated on  intravenous Solumedrol followed by Prednisone As needed bronchodilator therapy for shortness of breath and wheezing  As needed antitussives for cough  Hydrating patient gently with intravenous isotonic fluids  Following CRP, D-dimer,  CBC daily Blood cultures obtained  Chronic kidney disease, stage 3a ([place])- (present on admission)  Creatinine near baseline. Monitor creatinine, electrolytes, and volume status.  Avoid nephrotoxins and hypotension.   Elevated d-dimer- (present on admission) Notably elevated D-dimer Due to COVID-19 infection and/or superimposed bacterial infection pulmonary embolism must be ruled out considering patient recently discontinued his Xarelto several days ago. CT angiogram of the chest has ruled out PE. Continue anticoagulation with xarelto.  COVID-19 virus infection Patient presenting with complaints of generalized malaise in the days preceding hospitalization, and found to exhibit hypoxia postoperatively as well as low-grade fevers on arrival to the unit here. COVID-19 PCR testing positive. Chest x-ray revealing patchy bilateral infiltrates.  That are consistent with COVID-19 infection although underlying concurrent bacterial  pneumonia cannot be excluded. CTA chest has ruled out PE and again demonstrated patchy bilateral infiltrates consistent with COVID pneumonia. Airborne and contact isolation initiated. Providing patient with supplemental oxygen for bouts of hypoxia with target oxygen saturation of 94%. Patient was initially given a dose of Paxlovid but I have transitioned the patient to intravenous remdesivir for the remainder of his 5-day course of therapy. Considering bout of hypoxia and oxygen requirement patient has been initiated on intravenous Solumedrol followed by Prednisone As needed bronchodilator therapy for shortness of breath and wheezing  As needed antitussives for cough  Hydrating patient gently with intravenous isotonic fluids  Following CRP, D-dimer,  CBC daily Blood cultures obtained Close clinical monitoring    ICD (implantable cardioverter-defibrillator) in place- (present on admission) Successful placement of ICD by Dr. Quentin Ore on 2/13 We will defer postop management concerning this to EP/cardiology  Respiratory failure [facility])- (present on admission) Resolved. Patient required BIPAP overnight with 40% FIO2 to maintain saturations in the 90's. He is currently saturating 96% on room air. Monitor.  Paroxysmal atrial fibrillation ([place])- (present on admission) home regimen of Xarelto currently being held Continue amiodarone as at home Monitor on telemetry. Rate currently controlled with patient normal sinus rhythm  Chronic systolic CHF (congestive heart failure) ([place]) I personally find clinical evidence of cardiogenic volume overload I do not believe that the patchy bilateral infiltrates on chest imaging are due to pulmonary edema Continuing home regimen of torsemide for now   Pneumonia of both lungs due to infectious organism- (present on admission) Considering patient has been found to have an elevated procalcitonin concurrent bacterial pneumonia must be considered Patient is  additionally been initiated on intravenous ceftriaxone (switched from the typical post op regimen of Ancef) in addition to intravenous doxycycline CTA of the chest has ruled out PE and confirmed bilateral infiltrates. These are atypical and consistent with COVID-pneumonia. However, elevated procalcitonin suggests possible bacterial superinfection. Continue IV antibiotics. Remainder of assessment and plan as above  Type II diabetes mellitus with manifestations ([place])- (present on admission) At home the patient's glucoses were controlled with Farxiga 10 mg daily. The patient's most recent HbA1c was 5.3 per the patient. His glucoses might be higher here due to steroids given for COVID. Monitor and follow FSBS with SSI.  Chronic combined systolic and diastolic CHF (congestive heart failure) ([place])- (present on admission) As of TEE 05/16/2021 the patient's echocardiogram demonstrated no LV thrombus, LVEF is less than 20% with global hypokinesis. The LV internal cavity size was severely dilated. RV systolic function was  mildly reduced. Lt atrial size was mildly dilated there was no left atrial/left atrial appendage thrombus detected. This study ended early due to patient cough/gag. Diastolic function could not be evaluated. The patient underwent placement of AICD on 01/13/2022. He has been continued on his home dosages of Coreg 3.125, Losartan 12.5 daily, aldactone 25 mg daily, and demadex 20 mg bid. He did receive lasix 40 mg IV once on the evening of 01/13/2022 to improve respiratory function. Cardiology is following.  Essential hypertension- (present on admission) Continue home regimen of home antihypertensive therapy      I have seen and examined this patient myself. I have spent 36 minutes in her evaluation and care.  DVT prophylaxis: Xarelto Code Status: Full Code Family Communication: None available Disposition Plan: To be determined   Nida Manfredi, DO [facility] Direct contact: see  [email or web address]  7PM-7AM contact night coverage as above 01/14/2022, 7:07 PM  LOS: 0 days

## 2022-01-14 NOTE — Assessment & Plan Note (Addendum)
-   TEE 05/16/2021 > LVEF is less than 20% with global hypokinesis. The LV internal cavity size was severely dilated. RV systolic function was mildly reduced.   -  underwent placement of AICD on 01/13/2022.  -  continued  Coreg 3.125, Losartan 12.5 daily, aldactone 25 mg daily,  demadex 20 mg bid, Farxiga 10 mg daily- no changes in home meds

## 2022-01-14 NOTE — Assessment & Plan Note (Addendum)
stable °

## 2022-01-14 NOTE — Assessment & Plan Note (Signed)
•   I personally find clinical evidence of cardiogenic volume overload  I do not believe that the patchy bilateral infiltrates on chest imaging are due to pulmonary edema  Continuing home regimen of torsemide for now

## 2022-01-14 NOTE — Assessment & Plan Note (Addendum)
-   resume Xarelto on 2/20 per EP

## 2022-01-14 NOTE — Assessment & Plan Note (Addendum)
-   Successful placement of ICD by Dr. Quentin Ore on 2/13

## 2022-01-14 NOTE — Assessment & Plan Note (Addendum)
-  COVID-positive on 01/14/2022 -Possible bacterial infection ? aspiration-continue ceftriaxone and doxycycline -he is off the BiPAP - has received 3 days of treatment- he is asymptomatic today - will dc home with Ceftin, Doxy and Prednisone

## 2022-01-14 NOTE — Assessment & Plan Note (Addendum)
·   Patient presenting with complaints of generalized malaise in the days preceding hospitalization, and found to exhibit hypoxia postoperatively as well as low-grade fevers on arrival to the unit here.  COVID-19 PCR testing positive.  Chest x-ray revealing patchy bilateral infiltrates.  That are consistent with COVID-19 infection although underlying concurrent bacterial pneumonia cannot be excluded.  CTA chest has ruled out PE and again demonstrated patchy bilateral infiltrates consistent with COVID pneumonia.  Airborne and contact isolation initiated.  Providing patient with supplemental oxygen for bouts of hypoxia with target oxygen saturation of 94%.  Patient was initially given a dose of Paxlovid but I have transitioned the patient to intravenous remdesivir for the remainder of his 5-day course of therapy.  Considering bout of hypoxia and oxygen requirement patient has been initiated on intravenous Solumedrol followed by Prednisone  As needed bronchodilator therapy for shortness of breath and wheezing   As needed antitussives for cough   Hydrating patient gently with intravenous isotonic fluids   Following CRP, D-dimer,  CBC daily  Blood cultures obtained  Close clinical monitoring

## 2022-01-14 NOTE — Assessment & Plan Note (Addendum)
-   Continue home regimen of home antihypertensive therapy

## 2022-01-14 NOTE — Plan of Care (Signed)

## 2022-01-14 NOTE — Assessment & Plan Note (Addendum)
·   Considering patient has been found to have an elevated procalcitonin concurrent bacterial pneumonia must be considered  Patient is additionally been initiated on intravenous ceftriaxone (switched from the typical post op regimen of Ancef) in addition to intravenous doxycycline  CTA of the chest has ruled out PE and confirmed bilateral infiltrates. These are atypical and consistent with COVID-pneumonia. However, elevated procalcitonin suggests possible bacterial superinfection. Continue IV antibiotics.  Remainder of assessment and plan as above

## 2022-01-15 ENCOUNTER — Telehealth: Payer: Medicaid Other

## 2022-01-15 DIAGNOSIS — J9601 Acute respiratory failure with hypoxia: Secondary | ICD-10-CM

## 2022-01-15 LAB — CBC WITH DIFFERENTIAL/PLATELET
Abs Immature Granulocytes: 0.02 10*3/uL (ref 0.00–0.07)
Basophils Absolute: 0 10*3/uL (ref 0.0–0.1)
Basophils Relative: 0 %
Eosinophils Absolute: 0 10*3/uL (ref 0.0–0.5)
Eosinophils Relative: 0 %
HCT: 43.2 % (ref 39.0–52.0)
Hemoglobin: 15.8 g/dL (ref 13.0–17.0)
Immature Granulocytes: 0 %
Lymphocytes Relative: 14 %
Lymphs Abs: 1 10*3/uL (ref 0.7–4.0)
MCH: 30.6 pg (ref 26.0–34.0)
MCHC: 36.6 g/dL — ABNORMAL HIGH (ref 30.0–36.0)
MCV: 83.6 fL (ref 80.0–100.0)
Monocytes Absolute: 0.4 10*3/uL (ref 0.1–1.0)
Monocytes Relative: 6 %
Neutro Abs: 5.3 10*3/uL (ref 1.7–7.7)
Neutrophils Relative %: 80 %
Platelets: 183 10*3/uL (ref 150–400)
RBC: 5.17 MIL/uL (ref 4.22–5.81)
RDW: 14.2 % (ref 11.5–15.5)
WBC: 6.7 10*3/uL (ref 4.0–10.5)
nRBC: 0 % (ref 0.0–0.2)

## 2022-01-15 LAB — BASIC METABOLIC PANEL
Anion gap: 13 (ref 5–15)
BUN: 21 mg/dL — ABNORMAL HIGH (ref 6–20)
CO2: 22 mmol/L (ref 22–32)
Calcium: 9.1 mg/dL (ref 8.9–10.3)
Chloride: 102 mmol/L (ref 98–111)
Creatinine, Ser: 1.53 mg/dL — ABNORMAL HIGH (ref 0.61–1.24)
GFR, Estimated: 54 mL/min — ABNORMAL LOW (ref >60)
Glucose, Bld: 180 mg/dL — ABNORMAL HIGH (ref 70–99)
Potassium: 3.9 mmol/L (ref 3.5–5.1)
Sodium: 137 mmol/L (ref 135–145)

## 2022-01-15 NOTE — Plan of Care (Signed)

## 2022-01-15 NOTE — Plan of Care (Signed)

## 2022-01-15 NOTE — Progress Notes (Signed)
Patient placed self on BiPAP.

## 2022-01-15 NOTE — Progress Notes (Signed)
Tele/chart review AV pacing on telemetry VSS on RA Intermittent fever Cumulatively volume neg -2849ml Labs stable No PE Given he is s/p new ICD implant please DO NOT USE any kind of heparin products NO XARELTO until 01/20/22   Appreciate IM, management COVID  Tommye Standard, PA-C

## 2022-01-15 NOTE — Progress Notes (Deleted)
[place] MD/PA/NP OP Progress Note  01/15/2022 4:50 PM [name]  MRN:  [id number]  Chief Complaint: No chief complaint on file.  HPI:  - since the last visit, he was admitted for pneumonia in the setting of COVID - he was placed AICD     Visit Diagnosis: No diagnosis found.  Past Psychiatric History: ***  Past Medical History:  Past Medical History:  Diagnosis Date   Alcohol abuse    Anxiety state, unspecified    Atrial fibrillation (HCC)    CHF (congestive heart failure) (HCC)    Chronic systolic heart failure (HCC)    Diabetes mellitus, type II (HCC)    Edema    Gout    History of medication noncompliance    Migraine    Obesity, unspecified    Obstructive sleep apnea    Psychiatric disorder    Pulmonary embolism (HCC)    Shortness of breath     Past Surgical History:  Procedure Laterality Date   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   Procedure: BIV ICD INSERTION CRT-D;  Surgeon: Vickie Epley, MD;  Location: [facility];  Service: Cardiovascular;  Laterality: N/A;   CARDIAC CATHETERIZATION     CARDIAC CATHETERIZATION N/A 08/13/2015   Procedure: Right/Left Heart Cath and Coronary Angiography;  Surgeon: [name], MD;  Location: [facility];  Service: Cardiovascular;  Laterality: N/A;   RIGHT/LEFT HEART CATH AND CORONARY ANGIOGRAPHY N/A 04/14/2017   Procedure: Right/Left Heart Cath and Coronary Angiography;  Surgeon: [name], MD;  Location: [facility];  Service: Cardiovascular;  Laterality: N/A;   TESTICLE SURGERY      Family Psychiatric History: ***  Family History:  Family History  Problem Relation Age of Onset   Cancer Mother        brain tumor   Hypertension Mother    Diabetes Father        Deceased, 83   Heart disease Maternal Grandmother    Hypertension Other        Family History   Stroke Other        Family History   Diabetes Other        Family History   Diabetes Daughter     Social History:  Social  History   Socioeconomic History   Marital status: Married    Spouse name: Not on file   Number of children: 3   Years of education: Not on file   Highest education level: Not on file  Occupational History   Occupation: DISABLED  Tobacco Use   Smoking status: Former    Packs/day: 0.50    Years: 30.00    Pack years: 15.00    Types: Cigarettes    Quit date: 05/15/2020    Years since quitting: 1.6   Smokeless tobacco: Never   Tobacco comments:    vaping - nicotine-free products  Vaping Use   Vaping Use: Never used  Substance and Sexual Activity   Alcohol use: Not Currently    Alcohol/week: 0.0 standard drinks   Drug use: No   Sexual activity: Not Currently  Other Topics Concern   Not on file  Social History Narrative   He smokes about a pack per day and he has been smoking since he was 56 years of age.  He drinks alcohol occasionally, but he denies any illicit drug abuse.  He is presently on disability.    Lives with wife in a 2 story home.  Has 2 children.   Previously worked in [employer], last worked in 1998.   Highest level of education:  11th grade           Social Determinants of Health   Financial Resource Strain: Low Risk    Difficulty of Paying Living Expenses: Not hard at all  Food Insecurity: No Food Insecurity   Worried About Charity fundraiser in the Last Year: Never true   Ran Out of Food in the Last Year: Never true  Transportation Needs: No Transportation Needs   Lack of Transportation (Medical): No   Lack of Transportation (Non-Medical): No  Physical Activity: Sufficiently Active   Days of Exercise per Week: 5 days   Minutes of Exercise per Session: 30 min  Stress: No Stress Concern Present   Feeling of Stress : Not at all  Social Connections: Moderately Isolated   Frequency of Communication with Friends and Family: More than three times a week   Frequency of Social Gatherings with Friends and Family: Never   Attends Religious Services: Never    Marine scientist or Organizations: No   Attends Archivist Meetings: Never   Marital Status: Married    Allergies:  Allergies  Allergen Reactions   Ace Inhibitors Anaphylaxis and Swelling    Angioedema   Bidil [Isosorb Dinitrate-Hydralazine] Other (See Comments)    headache   Digoxin And Related     Unspecified "side effects"   Buspirone Other (See Comments)    dizziness    Metabolic Disorder Labs: Lab Results  Component Value Date   HGBA1C 5.3 01/07/2022   MPG 105.41 01/07/2022   MPG 131 05/10/2021   No results found for: PROLACTIN Lab Results  Component Value Date   CHOL 142 01/07/2022   TRIG 133 01/07/2022   HDL 40 (L) 01/07/2022   CHOLHDL 3.6 01/07/2022   VLDL 27 01/07/2022   LDLCALC 75 01/07/2022   LDLCALC 101 (H) 02/22/2021   Lab Results  Component Value Date   TSH 2.183 11/01/2021   TSH 1.53 07/08/2021    Therapeutic Level Labs: No results found for: LITHIUM No results found for: VALPROATE No components found for:  CBMZ  Current Medications: No current facility-administered medications for this visit.   No current outpatient medications on file.   Facility-Administered Medications Ordered in Other Visits  Medication Dose Route Frequency Provider Last Rate Last Admin   acetaminophen (TYLENOL) tablet 1,000 mg  1,000 mg Oral Q8H Baldwin [place], PA-C   1,000 mg at 01/15/22 1640   acetaminophen (TYLENOL) tablet 325-650 mg  325-650 mg Oral Q4H PRN Vickie Epley, MD   650 mg at 01/14/22 2147   albuterol (VENTOLIN HFA) 108 (90 Base) MCG/ACT inhaler 2 puff  2 puff Inhalation Q4H PRN Vickie Epley, MD       amiodarone (PACERONE) tablet 200 mg  200 mg Oral Daily Lars Mage T, MD   200 mg at 01/15/22 5701   ascorbic acid (VITAMIN C) tablet 500 mg  500 mg Oral Daily Vernelle Emerald, MD   500 mg at 01/15/22 0850   buPROPion (WELLBUTRIN XL) 24 hr tablet 150 mg  150 mg Oral Daily Lars Mage T, MD   150 mg at 01/15/22 0852    carvedilol (COREG) tablet 3.125 mg  3.125 mg Oral BID WC Vickie Epley, MD   3.125 mg at 01/15/22 1640   cefTRIAXone (ROCEPHIN) 2 g in sodium chloride 0.9 % 100 mL IVPB  2  g Intravenous Daily Shalhoub, Sherryll Burger, MD 200 mL/hr at 01/15/22 0913 2 g at 01/15/22 0913   clonazePAM (KLONOPIN) tablet 1 mg  1 mg Oral BID PRN [name], MD   1 mg at 01/14/22 2133   dapagliflozin propanediol (FARXIGA) tablet 10 mg  10 mg Oral Daily Vickie Epley, MD   10 mg at 01/15/22 0849   doxycycline (VIBRAMYCIN) 100 mg in sodium chloride 0.9 % 250 mL IVPB  100 mg Intravenous BID Vernelle Emerald, MD 125 mL/hr at 01/15/22 0907 100 mg at 01/15/22 0907   guaiFENesin-dextromethorphan (ROBITUSSIN DM) 100-10 MG/5ML syrup 10 mL  10 mL Oral Q4H PRN Vernelle Emerald, MD   10 mL at 01/14/22 2133   methylPREDNISolone sodium succinate (SOLU-MEDROL) 125 mg/2 mL injection 85 mg  0.5 mg/kg Intravenous BID Vernelle Emerald, MD   85 mg at 01/15/22 1117   Followed by   Derrill Memo ON 01/17/2022] predniSONE (DELTASONE) tablet 50 mg  50 mg Oral Daily Shalhoub, Sherryll Burger, MD       ondansetron [facility]) injection 4 mg  4 mg Intravenous Q6H PRN Vickie Epley, MD       remdesivir 200 mg in sodium chloride 0.9% 250 mL IVPB  200 mg Intravenous Once Vickie Epley, MD       Followed by   remdesivir 100 mg in sodium chloride 0.9 % 100 mL IVPB  100 mg Intravenous Daily Lars Mage T, MD 200 mL/hr at 01/15/22 1213 100 mg at 01/15/22 1213   sertraline (ZOLOFT) tablet 150 mg  150 mg Oral Daily Vickie Epley, MD   150 mg at 01/15/22 0850   spironolactone (ALDACTONE) tablet 25 mg  25 mg Oral Daily Vickie Epley, MD   25 mg at 01/15/22 0851   torsemide (DEMADEX) tablet 20 mg  20 mg Oral BID Vickie Epley, MD   20 mg at 01/15/22 1640   zinc sulfate capsule 220 mg  220 mg Oral Daily Vernelle Emerald, MD   220 mg at 01/15/22 3567     Musculoskeletal: Strength & Muscle Tone: {desc; muscle tone:32375} Gait &  Station: {PE GAIT ED OLID:03013} Patient leans: {Patient Leans:21022755}  Psychiatric Specialty Exam: Review of Systems  There were no vitals taken for this visit.There is no height or weight on file to calculate BMI.  General Appearance: {Appearance:22683}  Eye Contact:  {BHH EYE CONTACT:22684}  Speech:  {Speech:22685}  Volume:  {Volume (PAA):22686}  Mood:  {BHH MOOD:22306}  Affect:  {Affect (PAA):22687}  Thought Process:  {Thought Process (PAA):22688}  Orientation:  {BHH ORIENTATION (PAA):22689}  Thought Content: {Thought Content:22690}   Suicidal Thoughts:  {ST/HT (PAA):22692}  Homicidal Thoughts:  {ST/HT (PAA):22692}  Memory:  {BHH MEMORY:22881}  Judgement:  {Judgement (PAA):22694}  Insight:  {Insight (PAA):22695}  Psychomotor Activity:  {Psychomotor (PAA):22696}  Concentration:  {Concentration:21399}  Recall:  {BHH GOOD/FAIR/POOR:22877}  Fund of Knowledge: {BHH GOOD/FAIR/POOR:22877}  Language: {BHH GOOD/FAIR/POOR:22877}  Akathisia:  {BHH YES OR NO:22294}  Handed:  {Handed:22697}  AIMS (if indicated): {Desc; done/not:10129}  Assets:  {Assets (PAA):22698}  ADL's:  {BHH HYH'O:88757}  Cognition: {chl bhh cognition:304700322}  Sleep:  {BHH GOOD/FAIR/POOR:22877}   Screenings: GAD-7    Flowsheet Row Counselor from 09/10/2021 in [place]  Total GAD-7 Score 8      PHQ2-9    Flowsheet Row Video Visit from 11/12/2021 in [place] Counselor from 09/10/2021 in [facility] Video Visit from 08/13/2021 in [facility] [place]  [facility] Office Visit from 07/08/2021 in [place] at [facility] Visit from 05/31/2021 in [place] at [facility]  PHQ-2 Total Score 0 6 4 0 0  PHQ-9 Total Score -- 18 14 -- --      Flowsheet Row Admission (Current) from 01/13/2022 in [facility] Video Visit from 11/12/2021 in [place] Counselor from 09/10/2021 in [place] No Risk No Risk No Risk        Assessment and Plan: ***  Collaboration of Care: Collaboration of Care: [facility] OP Collaboration of Care:[id number]}  Patient/Guardian was advised Release of Information must be obtained prior to any record release in order to collaborate their care with an outside provider. Patient/Guardian was advised if they have not already done so to contact the registration department to sign all necessary forms in order for [place] to release information regarding their care.   Consent: Patient/Guardian gives verbal consent for treatment and assignment of benefits for services provided during this visit. Patient/Guardian expressed understanding and agreed to proceed.    [name], MD 01/15/2022, 4:50 PM

## 2022-01-15 NOTE — Progress Notes (Addendum)
Progress Note   Patient: [name] ZDG:[id number] DOB: 28-Nov-1966 DOA: 01/13/2022     1 DOS: the patient was seen and examined on 01/15/2022   Brief hospital course: This is a 56 year old chronic systolic heart failure, atrial fibrillation, diabetes mellitus type 2, morbid obesity, obstructive sleep apnea, V-fib arrest in June 2022 and gout who was admitted to the hospital on 2/13 by the cardiology service for an AICD.  This was done to assess fully on 2/13.  Subsequently the patient was have a fever and a COVID-19 test was found to be positive.  Also after the procedure, he was noted to have "rapid and shallow breathing" with respiratory rate in the high 40s and low 50s and was placed on a BiPAP.  The patient states that no symptoms of COVID at home.  A chest x-ray on 2/14 suggested a mild left lower lobe infiltrate.  A CT scan for PE was performed on 2/14 : Patchy ground-glass particularly at the LEFT lung base could represent mild pneumonitis.  He was started on remdesivir, Solu-Medrol, ceftriaxone and doxycycline.   Assessment and Plan: * LLL pneumonia in setting of COVID infection- (present on admission) -COVID-positive on 01/14/2022 -Possible bacterial infection ? aspiration-continue ceftriaxone and doxycycline - Patient was initially given a dose of Paxlovid and then switched to remdesivir-first dose was received on 2/14-follow CRP and D-dimer to decide on duration of course -he is off the BiPAP  Acute respiratory failure with hypoxia ([place])- (present on admission) - Resolved.  -Patient required BIPAP on 2/14 after AICD.  Chronic combined systolic and diastolic CHF (congestive heart failure) ([place])- (present on admission) As of TEE 05/16/2021 > LVEF is less than 20% with global hypokinesis. The LV internal cavity size was severely dilated. RV systolic function was mildly reduced.   -  underwent placement of AICD on 01/13/2022. He  -  continued  Coreg 3.125, Losartan 12.5 daily,  aldactone 25 mg daily,  demadex 20 mg bid, Farxiga 10 mg daily  ICD (implantable cardioverter-defibrillator) in place- (present on admission) Successful placement of ICD by Dr. Quentin Ore on 2/13 We will defer postop management concerning this to EP/cardiology  Chronic kidney disease, stage 3a ([place])- (present on admission)  Creatinine near baseline. Monitor creatinine, electrolytes, and volume status.  Avoid nephrotoxins and hypotension.   Essential hypertension- (present on admission) Continue home regimen of home antihypertensive therapy  Paroxysmal atrial fibrillation ([place])- (present on admission) home regimen of Xarelto currently being held Continue amiodarone as at home In normal sinus rhythm  Type II diabetes mellitus with manifestations ([place])- (present on admission) At home the patient's glucoses were controlled with Farxiga 10 mg daily. The patient's most recent HbA1c was 5.3 per the patient. His glucoses might be higher here due to steroids given for COVID. Monitor and follow FSBS with SSI.        Subjective: Complains of mild cough with clear sputum.  Physical Exam: Vitals:   01/14/22 2148 01/14/22 2252 01/14/22 2355 01/15/22 0603  BP:   126/86 (!) 131/95  Pulse:  83 74 75  Resp:  (!) 21 16 20   Temp: (!) 101.2 F (38.4 C)  97.8 F (36.6 C) 98.3 F (36.8 C)  TempSrc: Oral  Oral Oral  SpO2:  96% 95% 91%  Weight:      Height:       General exam: Appears comfortable  HEENT: PERRLA, oral mucosa moist, no sclera icterus or thrush Respiratory system: Clear to auscultation. Respiratory effort normal. Cardiovascular system: S1 &  S2 heard, regular rate and rhythm Gastrointestinal system: Abdomen soft, non-tender, nondistended. Normal bowel sounds   Central nervous system: Alert and oriented. No focal neurological deficits. Extremities: No cyanosis, clubbing or edema Skin: No rashes or ulcers Psychiatry:  Mood & affect appropriate.    Data Reviewed:  All  results reviewed today.  Family Communication: None    Disposition: Status is: Inpatient Remains inpatient appropriate because: Treating acute infection    Place and maintain sequential compression device Start: 01/14/22 2014        Planned Discharge Destination: Home     Time spent: 35 minutes  Author: Debbe Odea, MD 01/15/2022 12:49 PM  For on call review [email or web address].

## 2022-01-15 NOTE — Hospital Course (Addendum)
This is a 56 year old chronic systolic heart failure, atrial fibrillation, diabetes mellitus type 2, morbid obesity, obstructive sleep apnea, V-fib arrest in June 2022 and gout who was admitted to the hospital on 2/13 by the cardiology service for an AICD.  This was done to assess fully on 2/13.  Subsequently the patient was have a fever and a COVID-19 test was found to be positive.  Also after the procedure, he was noted to have "rapid and shallow breathing" with respiratory rate in the high 40s and low 50s and was placed on a BiPAP.  The patient states that he had no symptoms of COVID at home.  A chest x-ray on 2/14 suggested a mild left lower lobe infiltrate.   A CT scan for PE was performed on 2/14 : Patchy ground-glass particularly at the LEFT lung base could represent mild pneumonitis.  He was started on remdesivir, Solu-Medrol, ceftriaxone and doxycycline.

## 2022-01-16 DIAGNOSIS — J189 Pneumonia, unspecified organism: Secondary | ICD-10-CM | POA: Diagnosis not present

## 2022-01-16 DIAGNOSIS — J9601 Acute respiratory failure with hypoxia: Secondary | ICD-10-CM | POA: Diagnosis not present

## 2022-01-16 LAB — D-DIMER, QUANTITATIVE: D-Dimer, Quant: 3.11 ug/mL-FEU — ABNORMAL HIGH (ref 0.00–0.50)

## 2022-01-16 LAB — GLUCOSE, CAPILLARY
Glucose-Capillary: 232 mg/dL — ABNORMAL HIGH (ref 70–99)
Glucose-Capillary: 137 mg/dL — ABNORMAL HIGH (ref 70–99)

## 2022-01-16 LAB — BASIC METABOLIC PANEL
Anion gap: 13 (ref 5–15)
BUN: 30 mg/dL — ABNORMAL HIGH (ref 6–20)
CO2: 21 mmol/L — ABNORMAL LOW (ref 22–32)
Calcium: 8.9 mg/dL (ref 8.9–10.3)
Chloride: 103 mmol/L (ref 98–111)
Creatinine, Ser: 1.47 mg/dL — ABNORMAL HIGH (ref 0.61–1.24)
GFR, Estimated: 57 mL/min — ABNORMAL LOW (ref >60)
Glucose, Bld: 193 mg/dL — ABNORMAL HIGH (ref 70–99)
Potassium: 3.9 mmol/L (ref 3.5–5.1)
Sodium: 137 mmol/L (ref 135–145)

## 2022-01-16 LAB — C-REACTIVE PROTEIN: CRP: 3.4 mg/dL — ABNORMAL HIGH (ref <1.0)

## 2022-01-16 MED ORDER — INSULIN ASPART 100 UNIT/ML IJ SOLN
0.0000 [IU] | Freq: Three times a day (TID) | INTRAMUSCULAR | Status: DC
  Administered 2022-01-16: 2 [IU] via SUBCUTANEOUS

## 2022-01-16 MED ORDER — CEFUROXIME AXETIL 500 MG PO TABS: 500.0000 mg | ORAL_TABLET | Freq: Two times a day (BID) | ORAL | 0 refills | Status: AC

## 2022-01-16 MED ORDER — DOXYCYCLINE MONOHYDRATE 100 MG PO TABS: 100.0000 mg | ORAL_TABLET | Freq: Two times a day (BID) | ORAL | 0 refills | Status: AC

## 2022-01-16 MED ORDER — DOXYCYCLINE HYCLATE 100 MG PO TABS: 100.0000 mg | ORAL_TABLET | Freq: Two times a day (BID) | ORAL | Status: DC

## 2022-01-16 MED ORDER — PREDNISONE 50 MG PO TABS
50.0000 mg | ORAL_TABLET | Freq: Every day | ORAL | 0 refills | Status: AC
Start: 2022-01-17 — End: 2022-01-20

## 2022-01-16 NOTE — Plan of Care (Signed)

## 2022-01-16 NOTE — Progress Notes (Signed)
RT note. Patient bpap qhs, not requiring bipap at this time. Patient on Rm air, RT will continue to monitor.

## 2022-01-16 NOTE — Plan of Care (Signed)
°  Problem: Education: Goal: Knowledge of General Education information will improve Description: Including pain rating scale, medication(s)/side effects and non-pharmacologic comfort measures 01/16/2022 1121 by Jae Dire, RN Outcome: Adequate for Discharge 01/16/2022 0810 by Jae Dire, RN Outcome: Progressing   Problem: Health Behavior/Discharge Planning: Goal: Ability to manage health-related needs will improve 01/16/2022 1121 by Jae Dire, RN Outcome: Adequate for Discharge 01/16/2022 0810 by Jae Dire, RN Outcome: Progressing   Problem: Clinical Measurements: Goal: Ability to maintain clinical measurements within normal limits will improve 01/16/2022 1121 by Jae Dire, RN Outcome: Adequate for Discharge 01/16/2022 0810 by Jae Dire, RN Outcome: Progressing Goal: Will remain free from infection Outcome: Adequate for Discharge Goal: Diagnostic test results will improve Outcome: Adequate for Discharge Goal: Respiratory complications will improve 01/16/2022 1121 by Jae Dire, RN Outcome: Adequate for Discharge 01/16/2022 0810 by Jae Dire, RN Outcome: Progressing Goal: Cardiovascular complication will be avoided Outcome: Adequate for Discharge   Problem: Activity: Goal: Risk for activity intolerance will decrease Outcome: Adequate for Discharge   Problem: Nutrition: Goal: Adequate nutrition will be maintained Outcome: Adequate for Discharge   Problem: Coping: Goal: Level of anxiety will decrease Outcome: Adequate for Discharge   Problem: Elimination: Goal: Will not experience complications related to bowel motility Outcome: Adequate for Discharge Goal: Will not experience complications related to urinary retention Outcome: Adequate for Discharge   Problem: Pain Managment: Goal: General experience of comfort will improve Outcome: Adequate for Discharge   Problem: Safety: Goal: Ability to remain free from injury will  improve Outcome: Adequate for Discharge   Problem: Skin Integrity: Goal: Risk for impaired skin integrity will decrease Outcome: Adequate for Discharge

## 2022-01-16 NOTE — TOC Progression Note (Signed)
Transition of Care [facility]) - Progression Note    Patient Details  Name: MARQUI FORMBY MRN: [id number] Date of Birth: Apr 14, 1966  Transition of Care [facility]) CM/SW Contact  Angelita Ingles, RN Phone Number:[phone]  01/16/2022, 12:54 PM  Clinical Narrative:     Transition of Care [facility]) Screening Note   Patient Details  Name: CHOUA CHALKER Date of Birth: [birth year]   Transition of Care [facility]) CM/SW Contact:    Angelita Ingles, RN Phone Number: 01/16/2022, 12:54 PM    Transition of Care Department [facility]) has reviewed patient and no TOC needs have been identified at this time. We will continue to monitor patient advancement through interdisciplinary progression rounds. If new patient transition needs arise, please place a TOC consult.          Expected Discharge Plan and Services           Expected Discharge Date: 01/16/22                                     Social Determinants of Health (SDOH) Interventions    Readmission Risk Interventions No flowsheet data found.

## 2022-01-16 NOTE — Discharge Summary (Signed)
Physician Discharge Summary   Patient: [name] MRN: [id number] DOB: [birth year]  Admit date:     01/13/2022  Discharge date: 01/16/22  Discharge Physician: Debbe Odea   PCP: [name], MD   Recommendations at discharge:    Xarelto to be resumed on 2/20  Discharge Diagnoses: Principal Problem:   LLL pneumonia in setting of COVID infection Active Problems:   Acute respiratory failure with hypoxia (HCC)   Chronic combined systolic and diastolic CHF (congestive heart failure) (HCC)   ICD (implantable cardioverter-defibrillator) in place   Paroxysmal atrial fibrillation ([place])   Chronic kidney disease, stage 3a ([place])   Essential hypertension   Type II diabetes mellitus with manifestations ([place])  Resolved Problems:   * No resolved hospital problems. [facility] Course: This is a 56 year old chronic systolic heart failure, atrial fibrillation, diabetes mellitus type 2, morbid obesity, obstructive sleep apnea, V-fib arrest in June 2022 and gout who was admitted to the hospital on 2/13 by the cardiology service for an AICD.  This was done to assess fully on 2/13.  Subsequently the patient was have a fever and a COVID-19 test was found to be positive.  Also after the procedure, he was noted to have "rapid and shallow breathing" with respiratory rate in the high 40s and low 50s and was placed on a BiPAP.  The patient states that he had no symptoms of COVID at home.  A chest x-ray on 2/14 suggested a mild left lower lobe infiltrate.   A CT scan for PE was performed on 2/14 : Patchy ground-glass particularly at the LEFT lung base could represent mild pneumonitis.  He was started on remdesivir, Solu-Medrol, ceftriaxone and doxycycline.   Assessment and Plan: * LLL pneumonia in setting of COVID infection- (present on admission) -COVID-positive on 01/14/2022 -Possible bacterial infection ? aspiration-continue ceftriaxone and doxycycline -he is off the BiPAP - has received 3 days  of treatment- he is asymptomatic today - will dc home with Ceftin, Doxy and Prednisone  Acute respiratory failure with hypoxia ([place])- (present on admission) - Resolved.  -Patient required BIPAP on 2/14 after AICD.  Chronic combined systolic and diastolic CHF (congestive heart failure) ([place])- (present on admission) - TEE 05/16/2021 > LVEF is less than 20% with global hypokinesis. The LV internal cavity size was severely dilated. RV systolic function was mildly reduced.   -  underwent placement of AICD on 01/13/2022.  -  continued  Coreg 3.125, Losartan 12.5 daily, aldactone 25 mg daily,  demadex 20 mg bid, Farxiga 10 mg daily- no changes in home meds  ICD (implantable cardioverter-defibrillator) in place- (present on admission) - Successful placement of ICD by Dr. Quentin Ore on 2/13   Chronic kidney disease, stage 3a ([place])- (present on admission)  -stable   Paroxysmal atrial fibrillation ([place])- (present on admission) - resume Xarelto on 2/20 per EP  Essential hypertension- (present on admission) - Continue home regimen of home antihypertensive therapy  Type II diabetes mellitus with manifestations ([place])- (present on admission)        DISCHARGE MEDICATION: Allergies as of 01/16/2022       Reactions   Ace Inhibitors Anaphylaxis, Swelling   Angioedema   Bidil [isosorb Dinitrate-hydralazine] Other (See Comments)   headache   Digoxin And Related    Unspecified "side effects"   Buspirone Other (See Comments)   dizziness        Medication List     STOP taking these medications    Xarelto 20 MG  Tabs tablet Generic drug: rivaroxaban       TAKE these medications    albuterol 108 (90 Base) MCG/ACT inhaler Commonly known as: VENTOLIN HFA Inhale 1-2 puffs into the lungs every 4 (four) hours as needed for shortness of breath or wheezing.   amiodarone 200 MG tablet Commonly known as: PACERONE TAKE 1 TABLET (200 MG TOTAL) BY MOUTH DAILY. PATIENT TAKING ONCE DAILY    ARIPiprazole 10 MG tablet Commonly known as: ABILIFY Take 1 tablet (10 mg total) by mouth daily.   atorvastatin 40 MG tablet Commonly known as: LIPITOR Take 1 tablet (40 mg total) by mouth daily.   blood glucose meter kit and supplies Kit Use to check blood sugar. DX E11.9   buPROPion 150 MG 24 hr tablet Commonly known as: Wellbutrin XL Take 1 tablet (150 mg total) by mouth daily.   carvedilol 3.125 MG tablet Commonly known as: COREG Take 1 tablet (3.125 mg total) by mouth 2 (two) times daily.   cefUROXime 500 MG tablet Commonly known as: CEFTIN Take 1 tablet (500 mg total) by mouth 2 (two) times daily for 4 days. Start taking on: January 17, 2022   clonazePAM 1 MG tablet Commonly known as: KLONOPIN Take 1 tablet (1 mg total) by mouth 2 (two) times daily as needed for anxiety.   dapagliflozin propanediol 10 MG Tabs tablet Commonly known as: Farxiga Take 1 tablet (10 mg total) by mouth daily.   doxycycline 100 MG tablet Commonly known as: ADOXA Take 1 tablet (100 mg total) by mouth 2 (two) times daily for 5 days.   losartan 25 MG tablet Commonly known as: COZAAR TAKE 0.5 TABLETS BY MOUTH AT BEDTIME.   pantoprazole 40 MG tablet Commonly known as: PROTONIX TAKE 1 TABLET BY MOUTH EVERY DAY   potassium chloride SA 20 MEQ tablet Commonly known as: KLOR-CON M Take 1 tablet (20 mEq total) by mouth daily.   predniSONE 50 MG tablet Commonly known as: DELTASONE Take 1 tablet (50 mg total) by mouth daily with breakfast for 3 days. Start taking on: January 17, 2022   PRESCRIPTION MEDICATION Inhale into the lungs See admin instructions. Bipap, pressure 16/12 with 2L of O2 - use whenever sleeping   sertraline 100 MG tablet Commonly known as: ZOLOFT Take 1.5 tablets (150 mg total) by mouth daily.   sildenafil 50 MG tablet Commonly known as: Viagra Take 1 tablet (50 mg total) by mouth daily as needed for erectile dysfunction (begin with one tablet --may take 2 if  necessary).   spironolactone 25 MG tablet Commonly known as: ALDACTONE Take 1 tablet (25 mg total) by mouth daily.   thiamine 100 MG tablet Take 1 tablet (100 mg total) by mouth daily.   torsemide 20 MG tablet Commonly known as: DEMADEX Take 2 tablets (40 mg total) by mouth daily. What changed:  how much to take when to take this         Discharge Exam: Filed Weights   01/13/22 0954 01/15/22 2147 01/16/22 0400  Weight: (!) 170.1 kg (!) 163.1 kg (!) 163.1 kg    Condition at discharge: stable  The results of significant diagnostics from this hospitalization (including imaging, microbiology, ancillary and laboratory) are listed below for reference.   Imaging Studies: DG Chest 2 View  Result Date: 01/14/2022 CLINICAL DATA:  COVID positive, ICD EXAM: CHEST - 2 VIEW COMPARISON:  Chest radiograph from one day prior. FINDINGS: Cervical configuration of 3 lead left subclavian ICD with lead tips overlying the right  atrium, right ventricle and coronary sinus. Stable cardiomediastinal silhouette with mild cardiomegaly. No pneumothorax. No pleural effusion. Low lung volumes. Borderline mild pulmonary edema, improved. Stable mild left basilar atelectasis. IMPRESSION: 1. Borderline mild congestive heart failure, improved. 2. Stable mild left basilar atelectasis. Electronically Signed   By: Ilona Sorrel M.D.   On: 01/14/2022 08:33   CT Angio Chest Pulmonary Embolism (PE) W or WO Contrast  Result Date: 01/14/2022 CLINICAL DATA:  A 56 year old male presents for evaluation of suspected pulmonary embolus. EXAM: CT ANGIOGRAPHY CHEST WITH CONTRAST TECHNIQUE: Multidetector CT imaging of the chest was performed using the standard protocol during bolus administration of intravenous contrast. Multiplanar CT image reconstructions and MIPs were obtained to evaluate the vascular anatomy. RADIATION DOSE REDUCTION: This exam was performed according to the departmental dose-optimization program which includes  automated exposure control, adjustment of the mA and/or kV according to patient size and/or use of iterative reconstruction technique. CONTRAST:  14m OMNIPAQUE IOHEXOL 350 MG/ML SOLN COMPARISON:  May 10, 2021. FINDINGS: Cardiovascular: Heart size remains markedly enlarged. Three lead cardiac assist device with power pack over the LEFT chest as before. Aorta not yet opacified with normal caliber and without signs of adjacent stranding. Study is limited by respiratory motion and patient body habitus. Opacification of more peripheral branches is limited perhaps related to underlying cardiac dysfunction. Also with arm position further quantum mottle limits assessment more of structures in the LEFT chest over the RIGHT chest. No signs of central or lobar level embolism. Segmental branches are not well assessed due to reasons outlined above. Mediastinum/Nodes: Scattered small mediastinal lymph nodes none with pathologic enlargement. No axillary adenopathy. No hilar adenopathy. Lungs/Pleura: Respiratory motion particularly at the mid chest and lower lobe level. No signs of pneumothorax, consolidation or pleural effusion. Evidence of basilar atelectasis. No lobar consolidation. Areas of patchy ground-glass in the LEFT lower lobe. Airways are patent. Upper Abdomen: Incidental imaging of upper abdominal contents shows no acute finding. 1.8 cm LEFT adrenal nodule has been seen on prior studies and is favored to represent a small adenoma. Not substantially changed since May of 2019. This measures 1 Hounsfield unit on some images of the current study but there is considerable quantum mottle. Musculoskeletal: No acute bone finding. No destructive bone process. Spinal degenerative changes. Review of the MIP images confirms the above findings. IMPRESSION: 1. Study is limited by respiratory motion and patient body habitus. No signs of central or lobar level embolism. Segmental branches are not well assessed due to reasons outlined  above. 2. Patchy ground-glass particularly at the LEFT lung base could represent mild pneumonitis. Correlate with any signs of respiratory infection. 3. Marked cardiomegaly with 3 lead cardiac assist device. 4. Stable LEFT adrenal nodule, favored to represent a small adenoma. Electronically Signed   By: GZetta BillsM.D.   On: 01/14/2022 10:49   EP PPM/ICD IMPLANT  Addendum Date: 01/13/2022   CONCLUSIONS: 1. Successful implantation of a [employer] Jude CRT-D for nonischemic cardiomyopathy 2. No early apparent complications.   Result Date: 01/13/2022 CONCLUSIONS: 1. Successful implantation of a St Jude CRT-D for nonischemic cardiomyopathy 2. No early apparent complications.   DG CHEST PORT 1 VIEW  Result Date: 01/13/2022 CLINICAL DATA:  Post ICD EXAM: PORTABLE CHEST 1 VIEW COMPARISON:  05/16/2021 FINDINGS: Interim placement of left-sided pacing device with leads projecting over the right atrium, right ventricle and coronary sinus. No visible left pneumothorax. Cardiomegaly with vascular congestion and mild pulmonary edema. No pleural effusion. IMPRESSION: 1. Left-sided pacing device  as above without visible pneumothorax 2. Cardiomegaly with vascular congestion and mild pulmonary edema Electronically Signed   By: Donavan Foil M.D.   On: 01/13/2022 18:04    Microbiology: Results for orders placed or performed during the hospital encounter of 01/13/22  SARS Coronavirus 2 by RT PCR (hospital order, performed in [facility] hospital lab) Nasopharyngeal Nasopharyngeal Swab     Status: Abnormal   Collection Time: 01/13/22  3:51 PM   Specimen: Nasopharyngeal Swab  Result Value Ref Range Status   SARS Coronavirus 2 POSITIVE (A) NEGATIVE Final    Comment: (NOTE) SARS-CoV-2 target nucleic acids are DETECTED  SARS-CoV-2 RNA is generally detectable in upper respiratory specimens  during the acute phase of infection.  Positive results are indicative  of the presence of the identified virus, but do not rule  out bacterial infection or co-infection with other pathogens not detected by the test.  Clinical correlation with patient history and  other diagnostic information is necessary to determine patient infection status.  The expected result is negative.  Fact Sheet for Patients:   StrictlyIdeas.no   Fact Sheet for Healthcare Providers:   BankingDealers.co.za    This test is not yet approved or cleared by the [place] FDA and  has been authorized for detection and/or diagnosis of SARS-CoV-2 by FDA under an Emergency Use Authorization (EUA).  This EUA will remain in effect (meaning this test can be used) for the duration of  the COVID-19 declaration under Section 564(b)(1)  of the Act, 21 U.S.C. section 360-bbb-3(b)(1), unless the authorization is terminated or revoked sooner.  Performed at [facility], [place] [address] [place], [place] [address]     Labs: CBC: Recent Labs  Lab 01/13/22 2220 01/14/22 0415 01/15/22 0055  WBC 9.2 8.7 6.7  NEUTROABS 7.0 5.4 5.3  HGB 14.3 14.8 15.8  HCT 39.3 40.7 43.2  MCV 83.8 83.9 83.6  PLT 192 195 160   Basic Metabolic Panel: Recent Labs  Lab 01/13/22 2220 01/14/22 0415 01/15/22 0055 01/16/22 0158  NA 139 138 137 137  K 3.3* 3.8 3.9 3.9  CL 106 106 102 103  CO2 23 23 22  21*  GLUCOSE 139* 94 180* 193*  BUN 23* 22* 21* 30*  CREATININE 1.71* 1.48* 1.53* 1.47*  CALCIUM 8.3* 9.0 9.1 8.9  MG  --  2.0  --   --    Liver Function Tests: Recent Labs  Lab 01/13/22 2220 01/14/22 0415  AST 28 28  ALT 28 26  ALKPHOS 105 107  BILITOT 0.6 0.6  PROT 6.7 7.0  ALBUMIN 3.3* 3.4*   CBG: Recent Labs  Lab 01/13/22 1000 01/13/22 1527 01/16/22 0745  GLUCAP 121* 149* 232*    Discharge time spent: greater than 30 minutes.  Signed: Debbe Odea, MD [facility] 01/16/2022

## 2022-01-16 NOTE — Progress Notes (Signed)
Patient re-visited and re-educated on wound care and activity instructions Follow up is in place Home with same meds No xarelto until 01/20/22  Ok to discharge from EP perspective after industry teaching is completed.  Tommye Standard, PA-C

## 2022-01-20 ENCOUNTER — Telehealth: Payer: Self-pay

## 2022-01-20 ENCOUNTER — Ambulatory Visit: Payer: Medicare HMO | Admitting: Psychiatry

## 2022-01-20 NOTE — Telephone Encounter (Signed)
Called pt to discuss Ozempic again. He does not wish to start at this time given recent procedure as well as COVID infection. I provided him with my direct # in office and advised him to call me if he wishes to try Ozempic in the future. He verbalized understanding.

## 2022-01-20 NOTE — Telephone Encounter (Signed)
Transition Care Management Follow-up Telephone Call Date of discharge and from where: [facility]  01-16-22 Dx: LLL pneumonia in setting of COVID  How have you been since you were released from the hospital? Feeling a little better  Any questions or concerns? No  Items Reviewed: Did the pt receive and understand the discharge instructions provided? Yes  Medications obtained and verified? Yes  Other? No  Any new allergies since your discharge? No  Dietary orders reviewed? Yes Do you have support at home? Yes   Home Care and Equipment/Supplies: Were home health services ordered? no If so, what is the name of the agency? [facility]   Has the agency set up a time to come to the patient's home? not applicable Were any new equipment or medical supplies ordered?  No What is the name of the medical supply agency? na Were you able to get the supplies/equipment? no Do you have any questions related to the use of the equipment or supplies? No  Functional Questionnaire: (I = Independent and D = Dependent) ADLs: I  Bathing/Dressing- I  Meal Prep- I  Eating- I  Maintaining continence- I  Transferring/Ambulation- I  Managing Meds- I  Follow up appointments reviewed:  PCP Hospital f/u appt confirmed? Yes  Scheduled to see [name]  on 01-23-22 @ 240pm . [facility] f/u appt --confirmed? Yes  Scheduled to see Dr Quentin Ore on 01-23-22 @ 1120am. Are transportation arrangements needed? No  If their condition worsens, is the pt aware to call PCP or go to the Emergency Dept.? Yes Was the patient provided with contact information for the PCP's office or ED? Yes Was to pt encouraged to call back with questions or concerns? Yes

## 2022-01-23 ENCOUNTER — Ambulatory Visit (INDEPENDENT_AMBULATORY_CARE_PROVIDER_SITE_OTHER): Payer: Medicare HMO

## 2022-01-23 ENCOUNTER — Ambulatory Visit (INDEPENDENT_AMBULATORY_CARE_PROVIDER_SITE_OTHER): Payer: Medicare HMO | Admitting: Internal Medicine

## 2022-01-23 ENCOUNTER — Encounter: Payer: Self-pay | Admitting: Internal Medicine

## 2022-01-23 ENCOUNTER — Other Ambulatory Visit: Payer: Self-pay

## 2022-01-23 VITALS — BP 118/74 | HR 73 | Temp 98.8°F | Ht 69.0 in | Wt 355.0 lb

## 2022-01-23 DIAGNOSIS — I517 Cardiomegaly: Secondary | ICD-10-CM | POA: Diagnosis not present

## 2022-01-23 DIAGNOSIS — J9601 Acute respiratory failure with hypoxia: Secondary | ICD-10-CM

## 2022-01-23 DIAGNOSIS — J189 Pneumonia, unspecified organism: Secondary | ICD-10-CM

## 2022-01-23 DIAGNOSIS — E118 Type 2 diabetes mellitus with unspecified complications: Secondary | ICD-10-CM | POA: Diagnosis not present

## 2022-01-23 DIAGNOSIS — R059 Cough, unspecified: Secondary | ICD-10-CM

## 2022-01-23 DIAGNOSIS — Z7901 Long term (current) use of anticoagulants: Secondary | ICD-10-CM

## 2022-01-23 DIAGNOSIS — I428 Other cardiomyopathies: Secondary | ICD-10-CM

## 2022-01-23 LAB — CUP PACEART INCLINIC DEVICE CHECK
Battery Remaining Longevity: 52 mo
Brady Statistic RA Percent Paced: 91 %
Brady Statistic RV Percent Paced: 97 %
Date Time Interrogation Session: 20230223162544
HighPow Impedance: 66.375
Implantable Lead Implant Date: 20230213
Implantable Lead Implant Date: 20230213
Implantable Lead Implant Date: 20230213
Implantable Lead Location: 753857
Implantable Lead Location: 753859
Implantable Lead Location: 753860
Implantable Pulse Generator Implant Date: 20230213
Lead Channel Impedance Value: 487.5 Ohm
Lead Channel Impedance Value: 512.5 Ohm
Lead Channel Impedance Value: 850 Ohm
Lead Channel Pacing Threshold Amplitude: 0.75 V
Lead Channel Pacing Threshold Amplitude: 0.75 V
Lead Channel Pacing Threshold Amplitude: 1 V
Lead Channel Pacing Threshold Amplitude: 1 V
Lead Channel Pacing Threshold Amplitude: 1 V
Lead Channel Pacing Threshold Amplitude: 1 V
Lead Channel Pacing Threshold Pulse Width: 0.5 ms
Lead Channel Pacing Threshold Pulse Width: 0.5 ms
Lead Channel Pacing Threshold Pulse Width: 0.5 ms
Lead Channel Pacing Threshold Pulse Width: 0.5 ms
Lead Channel Pacing Threshold Pulse Width: 0.5 ms
Lead Channel Pacing Threshold Pulse Width: 0.5 ms
Lead Channel Sensing Intrinsic Amplitude: 11.8 mV
Lead Channel Sensing Intrinsic Amplitude: 4.2 mV
Lead Channel Setting Pacing Amplitude: 3.5 V
Lead Channel Setting Pacing Amplitude: 3.5 V
Lead Channel Setting Pacing Amplitude: 3.5 V
Lead Channel Setting Pacing Pulse Width: 0.5 ms
Lead Channel Setting Pacing Pulse Width: 0.5 ms
Lead Channel Setting Sensing Sensitivity: 0.5 mV
Pulse Gen Serial Number: 210000080

## 2022-01-23 LAB — CBC WITH DIFFERENTIAL/PLATELET
Basophils Absolute: 0.1 10*3/uL (ref 0.0–0.1)
Basophils Relative: 0.7 % (ref 0.0–3.0)
Eosinophils Absolute: 0 10*3/uL (ref 0.0–0.7)
Eosinophils Relative: 0.4 % (ref 0.0–5.0)
HCT: 46.7 % (ref 39.0–52.0)
Hemoglobin: 16 g/dL (ref 13.0–17.0)
Lymphocytes Relative: 39.9 % (ref 12.0–46.0)
Lymphs Abs: 2.9 10*3/uL (ref 0.7–4.0)
MCHC: 34.4 g/dL (ref 30.0–36.0)
MCV: 86.3 fl (ref 78.0–100.0)
Monocytes Absolute: 1 10*3/uL (ref 0.1–1.0)
Monocytes Relative: 14.3 % — ABNORMAL HIGH (ref 3.0–12.0)
Neutro Abs: 3.2 10*3/uL (ref 1.4–7.7)
Neutrophils Relative %: 44.7 % (ref 43.0–77.0)
Platelets: 182 10*3/uL (ref 150.0–400.0)
RBC: 5.41 Mil/uL (ref 4.22–5.81)
RDW: 15.3 % (ref 11.5–15.5)
WBC: 7.2 10*3/uL (ref 4.0–10.5)

## 2022-01-23 LAB — URINALYSIS, ROUTINE W REFLEX MICROSCOPIC
Bilirubin Urine: NEGATIVE
Hgb urine dipstick: NEGATIVE
Ketones, ur: NEGATIVE
Leukocytes,Ua: NEGATIVE
Nitrite: NEGATIVE
RBC / HPF: NONE SEEN (ref 0–?)
Specific Gravity, Urine: 1.02 (ref 1.000–1.030)
Total Protein, Urine: NEGATIVE
Urine Glucose: >=1000 — AB
Urobilinogen, UA: 0.2 (ref 0.0–1.0)
pH: 5.5 (ref 5.0–8.0)

## 2022-01-23 LAB — BASIC METABOLIC PANEL
BUN: 20 mg/dL (ref 6–23)
CO2: 25 mEq/L (ref 19–32)
Calcium: 8.6 mg/dL (ref 8.4–10.5)
Chloride: 103 mEq/L (ref 96–112)
Creatinine, Ser: 1.32 mg/dL (ref 0.40–1.50)
GFR: 61.66 mL/min (ref >60.00)
Glucose, Bld: 79 mg/dL (ref 70–99)
Potassium: 4 mEq/L (ref 3.5–5.1)
Sodium: 138 mEq/L (ref 135–145)

## 2022-01-23 LAB — HEPATIC FUNCTION PANEL
ALT: 29 U/L (ref 0–53)
AST: 28 U/L (ref 0–37)
Albumin: 3.7 g/dL (ref 3.5–5.2)
Alkaline Phosphatase: 120 U/L — ABNORMAL HIGH (ref 39–117)
Bilirubin, Direct: 0.3 mg/dL (ref 0.0–0.3)
Total Bilirubin: 1.1 mg/dL (ref 0.2–1.2)
Total Protein: 7.4 g/dL (ref 6.0–8.3)

## 2022-01-23 LAB — TSH: TSH: 2.22 u[IU]/mL (ref 0.35–5.50)

## 2022-01-23 LAB — HEMOGLOBIN A1C: Hgb A1c MFr Bld: 6.3 % (ref 4.6–6.5)

## 2022-01-23 NOTE — Progress Notes (Signed)
Wound check appointment. Steri-strips removed. Wound without redness or edema. Incision edges approximated, wound well healed. Normal device function. Thresholds, sensing, and impedances consistent with implant measurements. Device programmed at 3.5V for extra safety margin until 3 month visit. Histogram distribution appropriate for patient and level of activity. AT/AF burden <1% w/ peak VR 154 bpm. Patient educated about wound care, arm mobility, lifting restrictions, shock plan. ROV in 3 months with implanting physician.

## 2022-01-23 NOTE — Progress Notes (Signed)
Patient ID: CAESON FILIPPI, male   DOB: 1965/12/06, 56 y.o.   MRN: [id number]        Chief Complaint: follow up recent covid infection, LLL CAP 2/13 - 2/16       HPI:  OREST DYGERT is a 56 y.o. male here with c/o recent covid infeciton tx also for presumed LLL bacterial pna with dua oral antibx, overall doing ok.  Pt denies chest pain, increased sob or doe, wheezing, orthopnea, PND, increased LE swelling, palpitations, dizziness or syncope.   Pt denies fever, wt loss, night sweats, loss of appetite, or other constitutional symptoms  Does have worsening constipation post hosp and fatigue and residual non prod cough.  Restarted 2/20 xarelto, did have some blood on blowing nose today, o/w no overt bleeding or bruising     Transitional Care Management elements noted today: 1)  Date of D/C: as above 2)  Medication reconciliation:  done today at end visit 3)  Review of D/C summary or other information:  done today 4)  Review of need for f/u on pending diagnostic tests and treatments:  done today 5)  Review of need for Interaction with other providers who will assume or resume care of pt specific problems: done today 6)  Education of patient/family/guardian or caregiver: done today  Wt Readings from Last 3 Encounters:  01/23/22 (!) 355 lb (161 kg)  01/16/22 (!) 359 lb 9.1 oz (163.1 kg)  01/07/22 (!) 375 lb 3.2 oz (170.2 kg)   BP Readings from Last 3 Encounters:  01/23/22 118/74  01/16/22 126/89  01/07/22 100/70         Past Medical History:  Diagnosis Date   Alcohol abuse    Anxiety state, unspecified    Atrial fibrillation (HCC)    CHF (congestive heart failure) (HCC)    Chronic systolic heart failure ([place])    Diabetes mellitus, type II ([place])    Edema    Gout    History of medication noncompliance    Migraine    Obesity, unspecified    Obstructive sleep apnea    Psychiatric disorder    Pulmonary embolism ([place])    Shortness of breath    Past Surgical History:  Procedure  Laterality Date   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   Procedure: BIV ICD INSERTION CRT-D;  Surgeon: Vickie Epley, MD;  Location: [facility];  Service: Cardiovascular;  Laterality: N/A;   CARDIAC CATHETERIZATION     CARDIAC CATHETERIZATION N/A 08/13/2015   Procedure: Right/Left Heart Cath and Coronary Angiography;  Surgeon: [name], MD;  Location: [facility];  Service: Cardiovascular;  Laterality: N/A;   RIGHT/LEFT HEART CATH AND CORONARY ANGIOGRAPHY N/A 04/14/2017   Procedure: Right/Left Heart Cath and Coronary Angiography;  Surgeon: [name], MD;  Location: [facility];  Service: Cardiovascular;  Laterality: N/A;   TESTICLE SURGERY      reports that he quit smoking about 20 months ago. His smoking use included cigarettes. He has a 15.00 pack-year smoking history. He has never used smokeless tobacco. He reports that he does not currently use alcohol. He reports that he does not use drugs. family history includes Cancer in his mother; Diabetes in his daughter, father, and another family member; Heart disease in his maternal grandmother; Hypertension in his mother and another family member; Stroke in an other family member. Allergies  Allergen Reactions   Ace Inhibitors Anaphylaxis and Swelling    Angioedema  Bidil [Isosorb Dinitrate-Hydralazine] Other (See Comments)    headache   Digoxin And Related     Unspecified "side effects"   Buspirone Other (See Comments)    dizziness   Current Outpatient Medications on File Prior to Visit  Medication Sig Dispense Refill   albuterol (VENTOLIN HFA) 108 (90 Base) MCG/ACT inhaler Inhale 1-2 puffs into the lungs every 4 (four) hours as needed for shortness of breath or wheezing.     amiodarone (PACERONE) 200 MG tablet TAKE 1 TABLET (200 MG TOTAL) BY MOUTH DAILY. PATIENT TAKING ONCE DAILY 90 tablet 1   ARIPiprazole (ABILIFY) 10 MG tablet Take 1 tablet (10 mg total) by mouth daily. 90 tablet 0   atorvastatin  (LIPITOR) 40 MG tablet Take 1 tablet (40 mg total) by mouth daily. 100 tablet 1   blood glucose meter kit and supplies KIT Use to check blood sugar. DX E11.9 1 each 0   buPROPion (WELLBUTRIN XL) 150 MG 24 hr tablet Take 1 tablet (150 mg total) by mouth daily. 90 tablet 0   carvedilol (COREG) 3.125 MG tablet Take 1 tablet (3.125 mg total) by mouth 2 (two) times daily. 60 tablet 6   clonazePAM (KLONOPIN) 1 MG tablet Take 1 tablet (1 mg total) by mouth 2 (two) times daily as needed for anxiety. 60 tablet 0   dapagliflozin propanediol (FARXIGA) 10 MG TABS tablet Take 1 tablet (10 mg total) by mouth daily. 90 tablet 1   pantoprazole (PROTONIX) 40 MG tablet TAKE 1 TABLET BY MOUTH EVERY DAY 90 tablet 1   potassium chloride SA (KLOR-CON M) 20 MEQ tablet Take 1 tablet (20 mEq total) by mouth daily. 60 tablet 11   PRESCRIPTION MEDICATION Inhale into the lungs See admin instructions. Bipap, pressure 16/12 with 2L of O2 - use whenever sleeping     rivaroxaban (XARELTO) 20 MG TABS tablet Take 20 mg by mouth daily with supper.     sertraline (ZOLOFT) 100 MG tablet Take 1.5 tablets (150 mg total) by mouth daily. 135 tablet 1   sildenafil (VIAGRA) 50 MG tablet Take 1 tablet (50 mg total) by mouth daily as needed for erectile dysfunction (begin with one tablet --may take 2 if necessary). 15 tablet 5   spironolactone (ALDACTONE) 25 MG tablet Take 1 tablet (25 mg total) by mouth daily. 90 tablet 1   thiamine 100 MG tablet Take 1 tablet (100 mg total) by mouth daily. 90 tablet 1   torsemide (DEMADEX) 20 MG tablet Take 2 tablets (40 mg total) by mouth daily. (Patient taking differently: Take 20 mg by mouth 2 (two) times daily.) 30 tablet 3   losartan (COZAAR) 25 MG tablet TAKE 0.5 TABLETS BY MOUTH AT BEDTIME. (Patient not taking: Reported on 01/08/2022) 45 tablet 2   [DISCONTINUED] pravastatin (PRAVACHOL) 40 MG tablet Take 40 mg by mouth daily.     No current facility-administered medications on file prior to visit.         ROS:  All others reviewed and negative.  Objective        PE:  BP 118/74 (BP Location: Right Arm, Patient Position: Sitting, Cuff Size: Large)    Pulse 73    Temp 98.8 F (37.1 C) (Oral)    Ht 5' 9"  (1.753 m)    Wt (!) 355 lb (161 kg)    SpO2 97%    BMI 52.42 kg/m                 Constitutional: Pt appears in  NAD               HENT: Head: NCAT.                Right Ear: External ear normal.                 Left Ear: External ear normal.                Eyes: . Pupils are equal, round, and reactive to light. Conjunctivae and EOM are normal               Nose: without d/c or deformity               Neck: Neck supple. Gross normal ROM               Cardiovascular: Normal rate and regular rhythm.                 Pulmonary/Chest: Effort normal and breath sounds without rales or wheezing.                Abd:  Soft, NT, ND, + BS, no organomegaly               Neurological: Pt is alert. At baseline orientation, motor grossly intact               Skin: Skin is warm. No rashes, no other new lesions, LE edema - nonr               Psychiatric: Pt behavior is normal without agitation   Micro: none  Cardiac tracings I have personally interpreted today:  none  Pertinent Radiological findings (summarize): none   Lab Results  Component Value Date   WBC 7.2 01/23/2022   HGB 16.0 01/23/2022   HCT 46.7 01/23/2022   PLT 182.0 01/23/2022   GLUCOSE 79 01/23/2022   CHOL 142 01/07/2022   TRIG 133 01/07/2022   HDL 40 (L) 01/07/2022   LDLDIRECT 114.0 03/21/2020   LDLCALC 75 01/07/2022   ALT 29 01/23/2022   AST 28 01/23/2022   NA 138 01/23/2022   K 4.0 01/23/2022   CL 103 01/23/2022   CREATININE 1.32 01/23/2022   BUN 20 01/23/2022   CO2 25 01/23/2022   TSH 2.22 01/23/2022   PSA 0.68 11/19/2020   INR 1.3 (H) 05/10/2021   HGBA1C 6.3 01/23/2022   MICROALBUR 2.1 (H) 11/19/2020   Assessment/Plan:  TALLIN HART is a 56 y.o. Black or African American [2] male with  has a past medical history  of Alcohol abuse, Anxiety state, unspecified, Atrial fibrillation ([place]), CHF (congestive heart failure) ([place]), Chronic systolic heart failure ([place]), Diabetes mellitus, type II ([place]), Edema, Gout, History of medication noncompliance, Migraine, Obesity, unspecified, Obstructive sleep apnea, Psychiatric disorder, Pulmonary embolism ([place]), and Shortness of breath.  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  Cathlean Cower, MD 01/25/2022 5:25 PM [facility]

## 2022-01-23 NOTE — Patient Instructions (Addendum)
Please continue all other medications as before, and ok to try the [facility] for the constipation  Please have the pharmacy call with any other refills you may need.  Please continue your efforts at being more active, low cholesterol diet, and weight control.  Please keep your appointments with your specialists as you may have planned  Please go to the XRAY Department in the first floor for the x-ray testing  Please go to the LAB at the blood drawing area for the tests to be done  You will be contacted by phone if any changes need to be made immediately.  Otherwise, you will receive a letter about your results with an explanation, but please check with MyChart first.  Please remember to sign up for MyChart if you have not done so, as this will be important to you in the future with finding out test results, communicating by private email, and scheduling acute appointments online when needed.  Please see [name] in 3 months

## 2022-01-23 NOTE — Patient Instructions (Signed)

## 2022-01-25 NOTE — Assessment & Plan Note (Signed)
Resolved,  to f/u any worsening symptoms or concerns  

## 2022-01-25 NOTE — Assessment & Plan Note (Signed)
xarelto restarted,  to f/u any worsening symptoms or concerns

## 2022-01-25 NOTE — Assessment & Plan Note (Addendum)
Resolved, finished oral antibx, ok to continue to follow, for f/u xray and labs per pt reqeust

## 2022-01-28 DIAGNOSIS — G4733 Obstructive sleep apnea (adult) (pediatric): Secondary | ICD-10-CM | POA: Diagnosis not present

## 2022-01-30 ENCOUNTER — Telehealth: Payer: Self-pay

## 2022-01-30 NOTE — Progress Notes (Signed)
? ? ?Chronic Care Management ?Pharmacy Assistant  ? ?Name: ARIEL WINGROVE  MRN: [id number] DOB: 1966/02/01 ? ?STARLIN STEIB is an 56 y.o. year old male who presents for his follow-up CCM visit with the clinical pharmacist. ? ?Reason for Encounter: Disease State ?  ?Conditions to be addressed/monitored: ?HTN ? ? ?Recent office visits:  ?01/23/22 Biagio Borg, MD-Internal Medicine (Pneumonia) Blood work ordered and chest xray, no med changes ? ?Recent consult visits:  ?None ID ? ?Hospital visits:  ?Medication Reconciliation was completed by comparing discharge summary, patient?s EMR and Pharmacy list, and upon discussion with patient. ? ?Admitted to the hospital on 01/13/22 due to pneumonia. Discharge date was 01/16/22. Discharged from [facility].   ? ?Medications that remain the same after Hospital Discharge:??  ?-All other medications will remain the same.   ? ?Medications: ?Outpatient Encounter Medications as of 01/30/2022  ?Medication Sig  ? albuterol (VENTOLIN HFA) 108 (90 Base) MCG/ACT inhaler Inhale 1-2 puffs into the lungs every 4 (four) hours as needed for shortness of breath or wheezing.  ? amiodarone (PACERONE) 200 MG tablet TAKE 1 TABLET (200 MG TOTAL) BY MOUTH DAILY. PATIENT TAKING ONCE DAILY  ? ARIPiprazole (ABILIFY) 10 MG tablet Take 1 tablet (10 mg total) by mouth daily.  ? atorvastatin (LIPITOR) 40 MG tablet Take 1 tablet (40 mg total) by mouth daily.  ? blood glucose meter kit and supplies KIT Use to check blood sugar. DX E11.9  ? buPROPion (WELLBUTRIN XL) 150 MG 24 hr tablet Take 1 tablet (150 mg total) by mouth daily.  ? carvedilol (COREG) 3.125 MG tablet Take 1 tablet (3.125 mg total) by mouth 2 (two) times daily.  ? clonazePAM (KLONOPIN) 1 MG tablet Take 1 tablet (1 mg total) by mouth 2 (two) times daily as needed for anxiety.  ? dapagliflozin propanediol (FARXIGA) 10 MG TABS tablet Take 1 tablet (10 mg total) by mouth daily.  ? losartan (COZAAR) 25 MG tablet TAKE 0.5 TABLETS BY  MOUTH AT BEDTIME. (Patient not taking: Reported on 01/08/2022)  ? pantoprazole (PROTONIX) 40 MG tablet TAKE 1 TABLET BY MOUTH EVERY DAY  ? potassium chloride SA (KLOR-CON M) 20 MEQ tablet Take 1 tablet (20 mEq total) by mouth daily.  ? PRESCRIPTION MEDICATION Inhale into the lungs See admin instructions. Bipap, pressure 16/12 with 2L of O2 - use whenever sleeping  ? rivaroxaban (XARELTO) 20 MG TABS tablet Take 20 mg by mouth daily with supper.  ? sertraline (ZOLOFT) 100 MG tablet Take 1.5 tablets (150 mg total) by mouth daily.  ? sildenafil (VIAGRA) 50 MG tablet Take 1 tablet (50 mg total) by mouth daily as needed for erectile dysfunction (begin with one tablet --may take 2 if necessary).  ? spironolactone (ALDACTONE) 25 MG tablet Take 1 tablet (25 mg total) by mouth daily.  ? thiamine 100 MG tablet Take 1 tablet (100 mg total) by mouth daily.  ? torsemide (DEMADEX) 20 MG tablet Take 2 tablets (40 mg total) by mouth daily. (Patient taking differently: Take 20 mg by mouth 2 (two) times daily.)  ? [DISCONTINUED] pravastatin (PRAVACHOL) 40 MG tablet Take 40 mg by mouth daily.  ? ?No facility-administered encounter medications on file as of 01/30/2022.  ? ?Reviewed chart prior to disease state call. Spoke with patient regarding BP ? ?Recent Office Vitals: ?BP Readings from Last 3 Encounters:  ?01/23/22 118/74  ?01/16/22 126/89  ?01/07/22 100/70  ? ?Pulse Readings from Last 3 Encounters:  ?01/23/22 73  ?01/16/22 73  ?  01/07/22 64  ?  ?Wt Readings from Last 3 Encounters:  ?01/23/22 (!) 355 lb (161 kg)  ?01/16/22 (!) 359 lb 9.1 oz (163.1 kg)  ?01/07/22 (!) 375 lb 3.2 oz (170.2 kg)  ?  ? ?Kidney Function ?Lab Results  ?Component Value Date/Time  ? CREATININE 1.32 01/23/2022 03:00 PM  ? CREATININE 1.47 (H) 01/16/2022 01:58 AM  ? CREATININE 1.18 08/24/2017 10:59 AM  ? CREATININE 1.06 01/15/2017 12:11 PM  ? GFR 61.66 01/23/2022 03:00 PM  ? GFRNONAA 57 (L) 01/16/2022 01:58 AM  ? GFRAA >60 05/15/2020 02:52 PM  ? ? ?BMP Latest Ref Rng  & Units 01/23/2022 01/16/2022 01/15/2022  ?Glucose 70 - 99 mg/dL 79 193(H) 180(H)  ?BUN 6 - 23 mg/dL 20 30(H) 21(H)  ?Creatinine 0.40 - 1.50 mg/dL 1.32 1.47(H) 1.53(H)  ?BUN/Creat Ratio 6 - 22 (calc) - - -  ?Sodium 135 - 145 mEq/L 138 137 137  ?Potassium 3.5 - 5.1 mEq/L 4.0 3.9 3.9  ?Chloride 96 - 112 mEq/L 103 103 102  ?CO2 19 - 32 mEq/L 25 21(L) 22  ?Calcium 8.4 - 10.5 mg/dL 8.6 8.9 9.1  ? ? ?Current antihypertensive regimen:  ?Losartan 25 mg ?Carvedilol 3.125 mg ? ?How often are you checking your Blood Pressure?  Patient states that he has not checked blood pressure he has been sick the last few weeks and spent time in hospital ? ?Current home BP readings: Patient last blood pressure reading was 118/74 at Dr. Jenny Reichmann office ? ?What recent interventions/DTPs have been made by any provider to improve Blood Pressure control since last CPP Visit: None ID ? ?Any recent hospitalizations or ED visits since last visit with CPP? Yes, patient was in hospital for pneumonia and pacemaker implant ? ?What diet changes have been made to improve Blood Pressure Control?  ?Patient states no changes in diet ? ?What exercise is being done to improve your Blood Pressure Control?  ?Patient states that he has not been exercising because he has been sick ? ?Adherence Review: ?Is the patient currently on ACE/ARB medication? Yes ?Does the patient have >5 day gap between last estimated fill dates? No ? ?Care Gaps: ?Colonoscopy-NA ?Diabetic Foot Exam-11/19/20 ?Ophthalmology-01/26/19 ?Dexa Scan - NA ?Annual Well Visit - 01/15/21 ?Micro albumin-11/19/20 ?Hemoglobin A1c- 01/23/22 ? ?Star Rating Drugs: ?Losartan 25 mg-last fill 09/05/21 90 ds ?Atorvastatin 40 mg-last fill 11/08/21 90 ds ? ?[name] ?Clinical Pharmacist Assistant ?[phone]  ? ?

## 2022-01-31 ENCOUNTER — Other Ambulatory Visit (HOSPITAL_COMMUNITY): Payer: Self-pay | Admitting: Family Medicine

## 2022-02-12 ENCOUNTER — Other Ambulatory Visit: Payer: Self-pay | Admitting: Psychiatry

## 2022-02-12 NOTE — Telephone Encounter (Signed)
Refill was declined at this time as the patient would still have some medication. Please contact him to make a follow up appointment (for 30 mins).  ?

## 2022-02-13 ENCOUNTER — Encounter: Payer: Self-pay | Admitting: Internal Medicine

## 2022-02-13 ENCOUNTER — Other Ambulatory Visit: Payer: Self-pay

## 2022-02-13 ENCOUNTER — Ambulatory Visit: Payer: Medicaid Other

## 2022-02-13 ENCOUNTER — Ambulatory Visit (INDEPENDENT_AMBULATORY_CARE_PROVIDER_SITE_OTHER): Payer: Medicare HMO | Admitting: Internal Medicine

## 2022-02-13 VITALS — BP 118/76 | HR 76 | Temp 98.5°F | Ht 69.0 in | Wt 354.0 lb

## 2022-02-13 DIAGNOSIS — I5042 Chronic combined systolic (congestive) and diastolic (congestive) heart failure: Secondary | ICD-10-CM

## 2022-02-13 DIAGNOSIS — I480 Paroxysmal atrial fibrillation: Secondary | ICD-10-CM | POA: Diagnosis not present

## 2022-02-13 DIAGNOSIS — E118 Type 2 diabetes mellitus with unspecified complications: Secondary | ICD-10-CM

## 2022-02-13 DIAGNOSIS — K219 Gastro-esophageal reflux disease without esophagitis: Secondary | ICD-10-CM | POA: Diagnosis not present

## 2022-02-13 DIAGNOSIS — L602 Onychogryphosis: Secondary | ICD-10-CM

## 2022-02-13 DIAGNOSIS — N400 Enlarged prostate without lower urinary tract symptoms: Secondary | ICD-10-CM

## 2022-02-13 DIAGNOSIS — Z23 Encounter for immunization: Secondary | ICD-10-CM

## 2022-02-13 LAB — URINALYSIS, ROUTINE W REFLEX MICROSCOPIC
Bilirubin Urine: NEGATIVE
Hgb urine dipstick: NEGATIVE
Ketones, ur: NEGATIVE
Leukocytes,Ua: NEGATIVE
Nitrite: NEGATIVE
RBC / HPF: NONE SEEN (ref 0–?)
Specific Gravity, Urine: 1.02 (ref 1.000–1.030)
Total Protein, Urine: NEGATIVE
Urine Glucose: >=1000 — AB
Urobilinogen, UA: 0.2 (ref 0.0–1.0)
pH: 5.5 (ref 5.0–8.0)

## 2022-02-13 LAB — MICROALBUMIN / CREATININE URINE RATIO
Creatinine,U: 194.2 mg/dL
Microalb Creat Ratio: 0.4 mg/g (ref 0.0–30.0)
Microalb, Ur: 0.8 mg/dL (ref 0.0–1.9)

## 2022-02-13 LAB — PSA: PSA: 0.47 ng/mL (ref 0.10–4.00)

## 2022-02-13 MED ORDER — RIVAROXABAN 20 MG PO TABS: 20.0000 mg | ORAL_TABLET | Freq: Every day | ORAL | 1 refills | Status: DC

## 2022-02-13 MED ORDER — BOOSTRIX 5-2.5-18.5 LF-MCG/0.5 IM SUSP: 0.5000 mL | Freq: Once | INTRAMUSCULAR | 0 refills | Status: AC

## 2022-02-13 MED ORDER — PANTOPRAZOLE SODIUM 40 MG PO TBEC
40.0000 mg | DELAYED_RELEASE_TABLET | Freq: Every day | ORAL | 1 refills | Status: DC
Start: 2022-02-13 — End: 2022-12-07

## 2022-02-13 NOTE — Progress Notes (Signed)
? ? ? ? ?Subjective:  ?Patient ID: GEROLD SAR, male    DOB: Nov 18, 1966  Age: 56 y.o. MRN: [id number] ? ?CC: Diabetes and Congestive Heart Failure ? ?This visit occurred during the SARS-CoV-2 public health emergency.  Safety protocols were in place, including screening questions prior to the visit, additional usage of staff PPE, and extensive cleaning of exam room while observing appropriate contact time as indicated for disinfecting solutions.   ? ?HPI ?[name] presents for f/up - ? ?The SOB has improved. He denies CP, diaphoresis, weight gain, edema. ? ?Outpatient Medications Prior to Visit  ?Medication Sig Dispense Refill  ? albuterol (VENTOLIN HFA) 108 (90 Base) MCG/ACT inhaler Inhale 1-2 puffs into the lungs every 4 (four) hours as needed for shortness of breath or wheezing.    ? amiodarone (PACERONE) 200 MG tablet TAKE 1 TABLET (200 MG TOTAL) BY MOUTH DAILY. PATIENT TAKING ONCE DAILY 90 tablet 1  ? ARIPiprazole (ABILIFY) 10 MG tablet Take 1 tablet (10 mg total) by mouth daily. 90 tablet 0  ? atorvastatin (LIPITOR) 40 MG tablet Take 1 tablet (40 mg total) by mouth daily. 100 tablet 1  ? blood glucose meter kit and supplies KIT Use to check blood sugar. DX E11.9 1 each 0  ? buPROPion (WELLBUTRIN XL) 150 MG 24 hr tablet Take 1 tablet (150 mg total) by mouth daily. 90 tablet 0  ? carvedilol (COREG) 3.125 MG tablet Take 1 tablet (3.125 mg total) by mouth 2 (two) times daily. 60 tablet 6  ? dapagliflozin propanediol (FARXIGA) 10 MG TABS tablet Take 1 tablet (10 mg total) by mouth daily. 90 tablet 1  ? losartan (COZAAR) 25 MG tablet TAKE 0.5 TABLETS BY MOUTH AT BEDTIME. 45 tablet 2  ? potassium chloride SA (KLOR-CON M) 20 MEQ tablet Take 1 tablet (20 mEq total) by mouth daily. 60 tablet 11  ? PRESCRIPTION MEDICATION Inhale into the lungs See admin instructions. Bipap, pressure 16/12 with 2L of O2 - use whenever sleeping    ? sertraline (ZOLOFT) 100 MG tablet Take 1.5 tablets (150 mg total) by mouth daily.  135 tablet 1  ? sildenafil (VIAGRA) 50 MG tablet Take 1 tablet (50 mg total) by mouth daily as needed for erectile dysfunction (begin with one tablet --may take 2 if necessary). 15 tablet 5  ? spironolactone (ALDACTONE) 25 MG tablet Take 1 tablet (25 mg total) by mouth daily. 90 tablet 1  ? thiamine 100 MG tablet Take 1 tablet (100 mg total) by mouth daily. 90 tablet 1  ? torsemide (DEMADEX) 20 MG tablet Take 2 tablets (40 mg total) by mouth daily. 60 tablet 1  ? pantoprazole (PROTONIX) 40 MG tablet TAKE 1 TABLET BY MOUTH EVERY DAY 90 tablet 1  ? rivaroxaban (XARELTO) 20 MG TABS tablet Take 20 mg by mouth daily with supper.    ? clonazePAM (KLONOPIN) 1 MG tablet Take 1 tablet (1 mg total) by mouth 2 (two) times daily as needed for anxiety. 60 tablet 0  ? ?No facility-administered medications prior to visit.  ? ? ?ROS ?Review of Systems  ?Constitutional:  Positive for unexpected weight change (intentional weight loss). Negative for chills, diaphoresis, fatigue and fever.  ?Respiratory:  Positive for shortness of breath. Negative for cough, chest tightness and wheezing.   ?Cardiovascular:  Negative for chest pain, palpitations and leg swelling.  ?Gastrointestinal:  Negative for abdominal pain, constipation, diarrhea, nausea and vomiting.  ?Genitourinary:  Negative for difficulty urinating.  ?Musculoskeletal: Negative.   ?  Skin: Negative.   ?Neurological:  Negative for dizziness and weakness.  ?Hematological:  Negative for adenopathy. Does not bruise/bleed easily.  ?Psychiatric/Behavioral: Negative.    ? ?Objective:  ?BP 118/76 (BP Location: Left Arm, Patient Position: Sitting, Cuff Size: Large)   Pulse 76   Temp 98.5 ?F (36.9 ?C) (Oral)   Ht 5' 9"  (1.753 m)   Wt (!) 354 lb (160.6 kg)   SpO2 96%   BMI 52.28 kg/m?  ? ?BP Readings from Last 3 Encounters:  ?02/13/22 118/76  ?01/23/22 118/74  ?01/16/22 126/89  ? ? ?Wt Readings from Last 3 Encounters:  ?02/13/22 (!) 354 lb (160.6 kg)  ?01/23/22 (!) 355 lb (161 kg)   ?01/16/22 (!) 359 lb 9.1 oz (163.1 kg)  ? ? ?Physical Exam ?HENT:  ?   Nose: Nose normal.  ?   Mouth/Throat:  ?   Mouth: Mucous membranes are moist.  ?Eyes:  ?   General: No scleral icterus. ?   Conjunctiva/sclera: Conjunctivae normal.  ?Cardiovascular:  ?   Rate and Rhythm: Normal rate and regular rhythm.  ?   Heart sounds: No murmur heard. ?Pulmonary:  ?   Effort: Pulmonary effort is normal.  ?   Breath sounds: No stridor. No wheezing, rhonchi or rales.  ?Abdominal:  ?   General: Abdomen is protuberant. Bowel sounds are normal. There is no distension.  ?   Palpations: Abdomen is soft. There is no hepatomegaly, splenomegaly or mass.  ?Musculoskeletal:     ?   General: Normal range of motion.  ?   Cervical back: Neck supple.  ?   Right lower leg: No edema.  ?   Left lower leg: No edema.  ?Lymphadenopathy:  ?   Cervical: No cervical adenopathy.  ?Skin: ?   General: Skin is warm.  ?Neurological:  ?   General: No focal deficit present.  ?   Mental Status: He is alert.  ?Psychiatric:     ?   Mood and Affect: Mood normal.     ?   Behavior: Behavior normal.  ? ? ?Lab Results  ?Component Value Date  ? WBC 7.2 01/23/2022  ? HGB 16.0 01/23/2022  ? HCT 46.7 01/23/2022  ? PLT 182.0 01/23/2022  ? GLUCOSE 79 01/23/2022  ? CHOL 142 01/07/2022  ? TRIG 133 01/07/2022  ? HDL 40 (L) 01/07/2022  ? LDLDIRECT 114.0 03/21/2020  ? [place] 75 01/07/2022  ? ALT 29 01/23/2022  ? AST 28 01/23/2022  ? NA 138 01/23/2022  ? K 4.0 01/23/2022  ? CL 103 01/23/2022  ? CREATININE 1.32 01/23/2022  ? BUN 20 01/23/2022  ? CO2 25 01/23/2022  ? TSH 2.22 01/23/2022  ? PSA 0.47 02/13/2022  ? INR 1.3 (H) 05/10/2021  ? HGBA1C 6.3 01/23/2022  ? MICROALBUR 0.8 02/13/2022  ? ? ?DG Chest 2 View ? ?Result Date: 01/14/2022 ?CLINICAL DATA:  COVID positive, ICD EXAM: CHEST - 2 VIEW COMPARISON:  Chest radiograph from one day prior. FINDINGS: Cervical configuration of 3 lead left subclavian ICD with lead tips overlying the right atrium, right ventricle and coronary  sinus. Stable cardiomediastinal silhouette with mild cardiomegaly. No pneumothorax. No pleural effusion. Low lung volumes. Borderline mild pulmonary edema, improved. Stable mild left basilar atelectasis. IMPRESSION: 1. Borderline mild congestive heart failure, improved. 2. Stable mild left basilar atelectasis. Electronically Signed   By: Ilona Sorrel M.D.   On: 01/14/2022 08:33  ? ?CT Angio Chest Pulmonary Embolism (PE) W or WO Contrast ? ?Result Date: 01/14/2022 ?CLINICAL  DATA:  A 56 year old male presents for evaluation of suspected pulmonary embolus. EXAM: CT ANGIOGRAPHY CHEST WITH CONTRAST TECHNIQUE: Multidetector CT imaging of the chest was performed using the standard protocol during bolus administration of intravenous contrast. Multiplanar CT image reconstructions and MIPs were obtained to evaluate the vascular anatomy. RADIATION DOSE REDUCTION: This exam was performed according to the departmental dose-optimization program which includes automated exposure  MG/ML SOLN COMPARISON:  May 10, 2021. FINDINGS: Cardiovascular: Heart control, adjustment of the mA and/or kV according to patient size and/or use of iterative reconstruction technique. CONTRAST:  74m OMNIPAQUE IOHEXOL 350size remains markedly enlarged. Three lead cardiac assist device with power pack over the LEFT chest as before. Aorta not yet opacified with normal caliber and without signs of adjacent stranding. Study is limited by respiratory motion and patient body habitus. Opacification of more peripheral branches is limited perhaps related to underlying cardiac dysfunction. Also with arm position further quantum mottle limits assessment more of structures in the LEFT chest over the RIGHT chest. No signs of central or lobar level embolism. Segmental branches are not well assessed due to reasons outlined above. Mediastinum/Nodes: Scattered small mediastinal lymph nodes none with pathologic enlargement. No axillary adenopathy. No hilar adenopathy.  Lungs/Pleura: Respiratory motion particularly at the mid chest and lower lobe level. No signs of pneumothorax, consolidation or pleural effusion. Evidence of basilar atelectasis. No lobar consolidation. Areas of patc

## 2022-02-13 NOTE — Patient Instructions (Signed)
Type 2 Diabetes Mellitus, Diagnosis, Adult ?Type 2 diabetes (type 2 diabetes mellitus) is a long-term, or chronic, disease. In type 2 diabetes, one or both of these problems may be present: ?The pancreas does not make enough of a hormone called insulin. ?Cells in the body do not respond properly to the insulin that the body makes (insulin resistance). ?Normally, insulin allows blood sugar (glucose) to enter cells in the body. The cells use glucose for energy. Insulin resistance or lack of insulin causes excess glucose to build up in the blood instead of going into cells. This causes high blood glucose (hyperglycemia).  ?What are the causes? ?The exact cause of type 2 diabetes is not known. ?What increases the risk? ?The following factors may make you more likely to develop this condition: ?Having a family member with type 2 diabetes. ?Being overweight or obese. ?Being inactive (sedentary). ?Having been diagnosed with insulin resistance. ?Having a history of prediabetes, diabetes when you were pregnant (gestational diabetes), or polycystic ovary syndrome (PCOS). ?What are the signs or symptoms? ?In the early stage of this condition, you may not have symptoms. Symptoms develop slowly and may include: ?Increased thirst or hunger. ?Increased urination. ?Unexplained weight loss. ?Tiredness (fatigue) or weakness. ?Vision changes, such as blurry vision. ?Dark patches on the skin. ?How is this diagnosed? ?This condition is diagnosed based on your symptoms, your medical history, a physical exam, and your blood glucose level. Your blood glucose may be checked with one or more of the following blood tests: ?A fasting blood glucose (FBG) test. You will not be allowed to eat (you will fast) for 8 hours or longer before a blood sample is taken. ?A random blood glucose test. This test checks blood glucose at any time of day regardless of when you ate. ?An A1C (hemoglobin A1C) blood test. This test provides information about blood  glucose levels over the previous 2-3 months. ?An oral glucose tolerance test (OGTT). This test measures your blood glucose at two times: ?After fasting. This is your baseline blood glucose level. ?Two hours after drinking a beverage that contains glucose. ?You may be diagnosed with type 2 diabetes if: ?Your fasting blood glucose level is 126 mg/dL (7.0 mmol/L) or higher. ?Your random blood glucose level is 200 mg/dL (11.1 mmol/L) or higher. ?Your A1C level is 6.5% or higher. ?Your oral glucose tolerance test result is higher than 200 mg/dL (11.1 mmol/L). ?These blood tests may be repeated to confirm your diagnosis. ?How is this treated? ?Your treatment may be managed by a specialist called an endocrinologist. Type 2 diabetes may be treated by following instructions from your health care provider about: ?Making dietary and lifestyle changes. These may include: ?Following a personalized nutrition plan that is developed by a registered dietitian. ?Exercising regularly. ?Finding ways to manage stress. ?Checking your blood glucose level as often as told. ?Taking diabetes medicines or insulin daily. This helps to keep your blood glucose levels in the healthy range. ?Taking medicines to help prevent complications from diabetes. Medicines may include: ?Aspirin. ?Medicine to lower cholesterol. ?Medicine to control blood pressure. ?Your health care provider will set treatment goals for you. Your goals will be based on your age, other medical conditions you have, and how you respond to diabetes treatment. Generally, the goal of treatment is to maintain the following blood glucose levels: ?Before meals: 80-130 mg/dL (4.4-7.2 mmol/L). ?After meals: below 180 mg/dL (10 mmol/L). ?A1C level: less than 7%. ?Follow these instructions at home: ?Questions to ask your health care provider ?  Consider asking the following questions: ?Should I meet with a certified diabetes care and education specialist? ?What diabetes medicines do I need,  and when should I take them? ?What equipment will I need to manage my diabetes at home? ?How often do I need to check my blood glucose? ?Where can I find a support group for people with diabetes? ?What number can I call if I have questions? ?When is my next appointment? ?General instructions ?Take over-the-counter and prescription medicines only as told by your health care provider. ?Keep all follow-up visits. This is important. ?Where to find more information ?For help and guidance and for more information about diabetes, please visit: ?American Diabetes Association (ADA): www.diabetes.org ?American Association of Diabetes Care and Education Specialists (ADCES): www.diabeteseducator.org ?International Diabetes Federation (IDF): www.idf.org ?Contact a health care provider if: ?Your blood glucose is at or above 240 mg/dL (13.3 mmol/L) for 2 days in a row. ?You have been sick or have had a fever for 2 days or longer, and you are not getting better. ?You have any of the following problems for more than 6 hours: ?You cannot eat or drink. ?You have nausea and vomiting. ?You have diarrhea. ?Get help right away if: ?You have severe hypoglycemia. This means your blood glucose is lower than 54 mg/dL (3.0 mmol/L). ?You become confused or you have trouble thinking clearly. ?You have difficulty breathing. ?You have moderate or large ketone levels in your urine. ?These symptoms may represent a serious problem that is an emergency. Do not wait to see if the symptoms will go away. Get medical help right away. Call your local emergency services (911 in the [place] Do not drive yourself to the hospital. ?Summary ?Type 2 diabetes mellitus is a long-term, or chronic, disease. In type 2 diabetes, the pancreas does not make enough of a hormone called insulin, or cells in the body do not respond properly to insulin that the body makes. ?This condition is treated by making dietary and lifestyle changes and taking diabetes medicines or  insulin. ?Your health care provider will set treatment goals for you. Your goals will be based on your age, other medical conditions you have, and how you respond to diabetes treatment. ?Keep all follow-up visits. This is important. ?This information is not intended to replace advice given to you by your health care provider. Make sure you discuss any questions you have with your health care provider. ?Document Revised: 02/11/2021 Document Reviewed: 02/11/2021 ?Elsevier Patient Education ? 2022 Elsevier Inc. ? ?

## 2022-02-20 ENCOUNTER — Telehealth (INDEPENDENT_AMBULATORY_CARE_PROVIDER_SITE_OTHER): Payer: Medicare HMO | Admitting: Psychiatry

## 2022-02-20 ENCOUNTER — Other Ambulatory Visit: Payer: Self-pay

## 2022-02-20 ENCOUNTER — Encounter: Payer: Self-pay | Admitting: Psychiatry

## 2022-02-20 DIAGNOSIS — R69 Illness, unspecified: Secondary | ICD-10-CM | POA: Diagnosis not present

## 2022-02-20 DIAGNOSIS — F1010 Alcohol abuse, uncomplicated: Secondary | ICD-10-CM | POA: Diagnosis not present

## 2022-02-20 DIAGNOSIS — F4310 Post-traumatic stress disorder, unspecified: Secondary | ICD-10-CM | POA: Diagnosis not present

## 2022-02-20 DIAGNOSIS — F410 Panic disorder [episodic paroxysmal anxiety] without agoraphobia: Secondary | ICD-10-CM | POA: Diagnosis not present

## 2022-02-20 DIAGNOSIS — F3175 Bipolar disorder, in partial remission, most recent episode depressed: Secondary | ICD-10-CM

## 2022-02-20 DIAGNOSIS — F3132 Bipolar disorder, current episode depressed, moderate: Secondary | ICD-10-CM | POA: Diagnosis not present

## 2022-02-20 MED ORDER — ARIPIPRAZOLE 10 MG PO TABS: 10.0000 mg | ORAL_TABLET | Freq: Every day | ORAL | 0 refills | Status: DC

## 2022-02-20 MED ORDER — BUPROPION HCL ER (XL) 150 MG PO TB24: 150.0000 mg | ORAL_TABLET | Freq: Every day | ORAL | 0 refills | Status: DC

## 2022-02-20 MED ORDER — CLONAZEPAM 1 MG PO TABS: 1.0000 mg | ORAL_TABLET | Freq: Two times a day (BID) | ORAL | 1 refills | Status: DC | PRN

## 2022-02-20 NOTE — Patient Instructions (Signed)
Continue sertraline 150 mg daily  ?Continue bupropion 150 mg daily ?Continue Abilify 10 mg daily ?Continue clonazepam 1 mg twice a day as needed for anxiety ? Next appointment- 5/15 at 3 PM  ? ?The next visit will be in person visit. Please arrive 15 mins before the scheduled time.  ? ?[facility]  ?Address: [address], [place], [place] [address]   ?

## 2022-02-20 NOTE — Progress Notes (Signed)
Virtual Visit via Video Note ? ?I connected with [name] on 02/20/22 at  1:00 PM EDT by a video enabled telemedicine application and verified that I am speaking with the correct person using two identifiers. ? ?Location: ?Patient: home ?Provider: office ?Persons participated in the visit- patient, provider  ?  ?I discussed the limitations of evaluation and management by telemedicine and the availability of in person appointments. The patient expressed understanding and agreed to proceed. ? ?  ?I discussed the assessment and treatment plan with the patient. The patient was provided an opportunity to ask questions and all were answered. The patient agreed with the plan and demonstrated an understanding of the instructions. ?  ?The patient was advised to call back or seek an in-person evaluation if the symptoms worsen or if the condition fails to improve as anticipated. ? ?I provided 17 minutes of non-face-to-face time during this encounter. ? ? ?[name], MD ? ? ? ?BH MD/PA/NP OP Progress Note ? ?02/20/2022 1:30 PM ?[name]  ?MRN:  [id number] ? ?Chief Complaint:  ?Chief Complaint  ?Patient presents with  ? Follow-up  ? Trauma  ? Alcohol Problem  ? ?HPI:  ?- since the last visit, he was admitted for CRT-D implant procedure, and was subsequently treated for pneumonia in the setting of COVID.  ? ?He states that he was admitted since the last visit for his cardiac condition, and COVID.  He has been feeling dizzy and fatigued.  Although he tried treadmill the other day, he felt exhausted.  He thinks everything is going good otherwise.  He reports good relationship with his wife and his children.  His son is not in the house anymore.  He takes medication every day.  Although he feels down about his current medical condition, he denies feeling depressed.  He has good appetite.  He has occasional insomnia.  He denies SI.  He denies decreased need for sleep, euphonia.  He has occasional panic attacks, and takes  clonazepam for anxiety.  He denies alcohol use or drug use.  He feels comfortable to stay on the current medication regimen at this time.  ? ? ?Wt Readings from Last 3 Encounters:  ?02/13/22 (!) 354 lb (160.6 kg)  ?01/23/22 (!) 355 lb (161 kg)  ?01/16/22 (!) 359 lb 9.1 oz (163.1 kg)  ?  ? ? ? ? ?Daily routine: sitting around most of the time (unable to go outside due to his physical issues) ?Exercise: treadmill every day ?Employment: unemployed, on disability due to heart issues  ?Support: ?Household: wife, her 2 children, and his son with autism ?Marital status: married since June 2021 ?Number of children: 3 (13, and 29) ?He had 13 siblings, felt neglected as a child ? ? ?Visit Diagnosis:  ?  ICD-10-CM   ?1. Bipolar disorder, in partial remission, most recent episode depressed ([place])  F31.75   ?  ?2. PTSD (post-traumatic stress disorder)  F43.10   ?  ?3. Alcohol use disorder, mild, abuse  F10.10   ?  ?4. Bipolar affective disorder, currently depressed, moderate ([facility])  F31.32 ARIPiprazole (ABILIFY) 10 MG tablet  ?  ?5. Panic disorder  F41.0 clonazePAM (KLONOPIN) 1 MG tablet  ?  ? ? ?Past Psychiatric History: Please see initial evaluation for full details. I have reviewed the history. No updates at this time.  ?  ? ?Past Medical History:  ?Past Medical History:  ?Diagnosis Date  ? Alcohol abuse   ? Anxiety state, unspecified   ?  Atrial fibrillation ([place])   ? CHF (congestive heart failure) ([place])   ? Chronic systolic heart failure ([place])   ? Diabetes mellitus, type II ([place])   ? Edema   ? Gout   ? History of medication noncompliance   ? Migraine   ? Obesity, unspecified   ? Obstructive sleep apnea   ? Psychiatric disorder   ? Pulmonary embolism ([place])   ? Shortness of breath   ?  ?Past Surgical History:  ?Procedure Laterality Date  ? 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  ? Procedure: BIV ICD INSERTION CRT-D;  Surgeon: Vickie Epley, MD;  Location: [facility];  Service: Cardiovascular;  Laterality: N/A;  ?  CARDIAC CATHETERIZATION    ? CARDIAC CATHETERIZATION N/A 08/13/2015  ? Procedure: Right/Left Heart Cath and Coronary Angiography;  Surgeon: [name], MD;  Location: [facility];  Service: Cardiovascular;  Laterality: N/A;  ? RIGHT/LEFT HEART CATH AND CORONARY ANGIOGRAPHY N/A 04/14/2017  ? Procedure: Right/Left Heart Cath and Coronary Angiography;  Surgeon: [name], MD;  Location: [facility];  Service: Cardiovascular;  Laterality: N/A;  ? TESTICLE SURGERY    ? ? ?Family Psychiatric History: Please see initial evaluation for full details. I have reviewed the history. No updates at this time.  ?  ? ?Family History:  ?Family History  ?Problem Relation Age of Onset  ? Cancer Mother   ?     brain tumor  ? Hypertension Mother   ? Diabetes Father   ?     Deceased, 71  ? Heart disease Maternal Grandmother   ? Hypertension Other   ?     Family History  ? Stroke Other   ?     Family History  ? Diabetes Other   ?     Family History  ? Diabetes Daughter   ? ? ?Social History:  ?Social History  ? ?Socioeconomic History  ? Marital status: Married  ?  Spouse name: Not on file  ? Number of children: 3  ? Years of education: Not on file  ? Highest education level: Not on file  ?Occupational History  ? Occupation: DISABLED  ?Tobacco Use  ? Smoking status: Former  ?  Packs/day: 0.50  ?  Years: 30.00  ?  Pack years: 15.00  ?  Types: Cigarettes  ?  Quit date: 05/15/2020  ?  Years since quitting: 1.7  ? Smokeless tobacco: Never  ? Tobacco comments:  ?  vaping - nicotine-free products  ?Vaping Use  ? Vaping Use: Never used  ?Substance and Sexual Activity  ? Alcohol use: Not Currently  ?  Alcohol/week: 0.0 standard drinks  ? Drug use: No  ? Sexual activity: Not Currently  ?Other Topics Concern  ? Not on file  ?Social History Narrative  ? He smokes about a pack per day and he has been smoking since he was 56 years of age.  He drinks alcohol occasionally, but he denies any illicit drug abuse.  He is presently on  disability.   ? Lives with wife in a 2 story home.  Has 2 children.  ? Previously worked in [employer], last worked in 1998.  ? Highest level of education:  11th grade  ?   ?     ? ?Social Determinants of Health  ? ?Financial Resource Strain: Not on file  ?Food Insecurity: Not on file  ?Transportation Needs: Not on file  ?Physical Activity: Not on file  ?Stress:  Not on file  ?Social Connections: Not on file  ? ? ?Allergies:  ?Allergies  ?Allergen Reactions  ? Ace Inhibitors Anaphylaxis and Swelling  ?  Angioedema  ? Bidil [Isosorb Dinitrate-Hydralazine] Other (See Comments)  ?  headache  ? Digoxin And Related   ?  Unspecified "side effects"  ? Buspirone Other (See Comments)  ?  dizziness  ? ? ?Metabolic Disorder Labs: ?Lab Results  ?Component Value Date  ? HGBA1C 6.3 01/23/2022  ? MPG 105.41 01/07/2022  ? MPG 131 05/10/2021  ? ?No results found for: PROLACTIN ?Lab Results  ?Component Value Date  ? CHOL 142 01/07/2022  ? TRIG 133 01/07/2022  ? HDL 40 (L) 01/07/2022  ? CHOLHDL 3.6 01/07/2022  ? VLDL 27 01/07/2022  ? [place] 75 01/07/2022  ? LDLCALC 101 (H) 02/22/2021  ? ?Lab Results  ?Component Value Date  ? TSH 2.22 01/23/2022  ? TSH 2.183 11/01/2021  ? ? ?Therapeutic Level Labs: ?No results found for: LITHIUM ?No results found for: VALPROATE ?No components found for:  CBMZ ? ?Current Medications: ?Current Outpatient Medications  ?Medication Sig Dispense Refill  ? albuterol (VENTOLIN HFA) 108 (90 Base) MCG/ACT inhaler Inhale 1-2 puffs into the lungs every 4 (four) hours as needed for shortness of breath or wheezing.    ? amiodarone (PACERONE) 200 MG tablet TAKE 1 TABLET (200 MG TOTAL) BY MOUTH DAILY. PATIENT TAKING ONCE DAILY 90 tablet 1  ? ARIPiprazole (ABILIFY) 10 MG tablet Take 1 tablet (10 mg total) by mouth daily. 90 tablet 0  ? atorvastatin (LIPITOR) 40 MG tablet Take 1 tablet (40 mg total) by mouth daily. 100 tablet 1  ? blood glucose meter kit and supplies KIT Use to check blood sugar. DX E11.9 1 each 0  ?  buPROPion (WELLBUTRIN XL) 150 MG 24 hr tablet Take 1 tablet (150 mg total) by mouth daily. 90 tablet 0  ? carvedilol (COREG) 3.125 MG tablet Take 1 tablet (3.125 mg total) by mouth 2 (two) times daily. 60 tab

## 2022-02-20 NOTE — Telephone Encounter (Signed)
pt was made an appt for today ?

## 2022-02-21 ENCOUNTER — Encounter: Payer: Self-pay | Admitting: Podiatrist

## 2022-02-21 ENCOUNTER — Other Ambulatory Visit: Payer: Self-pay

## 2022-02-21 ENCOUNTER — Ambulatory Visit (INDEPENDENT_AMBULATORY_CARE_PROVIDER_SITE_OTHER): Payer: Medicare HMO | Admitting: Podiatrist

## 2022-02-21 DIAGNOSIS — L84 Corns and callosities: Secondary | ICD-10-CM | POA: Diagnosis not present

## 2022-02-21 DIAGNOSIS — M79675 Pain in left toe(s): Secondary | ICD-10-CM

## 2022-02-21 DIAGNOSIS — M79674 Pain in right toe(s): Secondary | ICD-10-CM | POA: Diagnosis not present

## 2022-02-21 DIAGNOSIS — B351 Tinea unguium: Secondary | ICD-10-CM

## 2022-02-21 NOTE — Progress Notes (Signed)
?No chief complaint on file. ?  ? ?HPI: Patient is 56 y.o. male who presents today for painful thickened toenails of bilateral feet he also relates the left great toenail is painful and he has dryness on his feet with cracked painful heels.  He also relates he just got a new pacemaker installed and he is on Xarelto for atrial fibrillation ? ?Patient Active Problem List  ? Diagnosis Date Noted  ? Long toenail 02/13/2022  ? COVID 01/14/2022  ? ICD (implantable cardioverter-defibrillator) in place 01/13/2022  ? Diuretic-induced hypokalemia 07/08/2021  ? Hemoptysis 05/10/2021  ? Colon cancer screening 11/19/2020  ? Hyperlipidemia with target LDL less than 100 10/12/2019  ? Screen for colon cancer 10/12/2019  ? Hidradenitis suppurativa of left axilla 10/12/2019  ? Benign prostatic hyperplasia without lower urinary tract symptoms 10/12/2019  ? Claudication of both lower extremities ([place]) 04/20/2019  ? Drug-induced erectile dysfunction 02/28/2019  ? Vitamin D deficiency disease 01/20/2019  ? Osteoarthritis of both ankles and feet 11/30/2018  ? Diabetic neuropathy, painful ([place]) 11/30/2018  ? Fatty liver disease, nonalcoholic 35/24/8185  ? Thiamin deficiency 04/29/2018  ? Cholelithiasis 04/22/2018  ? Hyperbilirubinemia   ? PE (pulmonary thromboembolism) ([place]) 04/12/2017  ? Hereditary and idiopathic peripheral neuropathy 07/16/2016  ? Paroxysmal atrial fibrillation ([facility])   ? Chronic anticoagulation, secondary to PE 03/2014 12/27/2014  ? Osteoarthritis of both knees 12/27/2014  ? NSVT (nonsustained ventricular tachycardia) ([place]) 09/26/2013  ? Alcohol abuse 08/10/2013  ? Hypertriglyceridemia 06/29/2013  ? Nonischemic cardiomyopathy ([place]) 01/26/2013  ? Type II diabetes mellitus with manifestations ([place]) 04/13/2012  ? Tobacco user 02/18/2012  ? ED (erectile dysfunction) 07/11/2011  ? ATRIAL FIBRILLATION, PAROXYSMAL 09/18/2010  ? Morbid obesity ([place]) 06/11/2010  ? Essential hypertension 06/11/2010  ? Chronic combined systolic and  diastolic CHF (congestive heart failure) ([place]) 06/11/2010  ? Esophageal reflux 06/11/2010  ? OSA (obstructive sleep apnea) with BiPap and oxygen 06/11/2010  ? ? ?Current Outpatient Medications on File Prior to Visit  ?Medication Sig Dispense Refill  ? albuterol (VENTOLIN HFA) 108 (90 Base) MCG/ACT inhaler Inhale 1-2 puffs into the lungs every 4 (four) hours as needed for shortness of breath or wheezing.    ? amiodarone (PACERONE) 200 MG tablet TAKE 1 TABLET (200 MG TOTAL) BY MOUTH DAILY. PATIENT TAKING ONCE DAILY 90 tablet 1  ? ARIPiprazole (ABILIFY) 10 MG tablet Take 1 tablet (10 mg total) by mouth daily. 90 tablet 0  ? atorvastatin (LIPITOR) 40 MG tablet Take 1 tablet (40 mg total) by mouth daily. 100 tablet 1  ? blood glucose meter kit and supplies KIT Use to check blood sugar. DX E11.9 1 each 0  ? buPROPion (WELLBUTRIN XL) 150 MG 24 hr tablet Take 1 tablet (150 mg total) by mouth daily. 90 tablet 0  ? carvedilol (COREG) 3.125 MG tablet Take 1 tablet (3.125 mg total) by mouth 2 (two) times daily. 60 tablet 6  ? clonazePAM (KLONOPIN) 1 MG tablet Take 1 tablet (1 mg total) by mouth 2 (two) times daily as needed for anxiety. 60 tablet 1  ? dapagliflozin propanediol (FARXIGA) 10 MG TABS tablet Take 1 tablet (10 mg total) by mouth daily. 90 tablet 1  ? losartan (COZAAR) 25 MG tablet TAKE 0.5 TABLETS BY MOUTH AT BEDTIME. 45 tablet 2  ? pantoprazole (PROTONIX) 40 MG tablet Take 1 tablet (40 mg total) by mouth daily. 90 tablet 1  ? potassium chloride SA (KLOR-CON M) 20 MEQ tablet Take 1 tablet (20 mEq total) by mouth daily.  60 tablet 11  ? PRESCRIPTION MEDICATION Inhale into the lungs See admin instructions. Bipap, pressure 16/12 with 2L of O2 - use whenever sleeping    ? rivaroxaban (XARELTO) 20 MG TABS tablet Take 1 tablet (20 mg total) by mouth daily with supper. 90 tablet 1  ? sertraline (ZOLOFT) 100 MG tablet Take 1.5 tablets (150 mg total) by mouth daily. 135 tablet 1  ? sildenafil (VIAGRA) 50 MG tablet Take 1  tablet (50 mg total) by mouth daily as needed for erectile dysfunction (begin with one tablet --may take 2 if necessary). 15 tablet 5  ? spironolactone (ALDACTONE) 25 MG tablet Take 1 tablet (25 mg total) by mouth daily. 90 tablet 1  ? thiamine 100 MG tablet Take 1 tablet (100 mg total) by mouth daily. 90 tablet 1  ? torsemide (DEMADEX) 20 MG tablet Take 2 tablets (40 mg total) by mouth daily. 60 tablet 1  ? [DISCONTINUED] pravastatin (PRAVACHOL) 40 MG tablet Take 40 mg by mouth daily.    ? ?No current facility-administered medications on file prior to visit.  ? ? ?Allergies  ?Allergen Reactions  ? Ace Inhibitors Anaphylaxis and Swelling  ?  Angioedema  ? Bidil [Isosorb Dinitrate-Hydralazine] Other (See Comments)  ?  headache  ? Digoxin And Related   ?  Unspecified "side effects"  ? Buspirone Other (See Comments)  ?  dizziness  ? ? ?Review of Systems ?No fevers, chills, nausea, muscle aches, no difficulty breathing, no calf pain, no chest pain or shortness of breath. ? ? ?Physical Exam ? ?GENERAL APPEARANCE: Alert, conversant. Appropriately groomed. No acute distress.  ? ?VASCULAR: Pedal pulses palpable DP and PT bilateral.  Capillary refill time is immediate to all digits,  Proximal to distal cooling it warm to warm.  Digital perfusion adequate.  ? ?NEUROLOGIC: sensation is intact to 5.07 monofilament at 5/5 sites bilateral.  Light touch is intact bilateral, vibratory sensation intact bilateral ? ?MUSCULOSKELETAL: acceptable muscle strength, tone and stability bilateral.  No gross boney pedal deformities noted.  Planus foot type is noted bilateral.  Bunion deformity is also seen on the left. ? ?DERMATOLOGIC: skin is warm, xerotic with scaling dry skin plantarly bilateral.   No open wounds are noted.  No preulcerative lesions are seen.  Digital nails are thick, discolored, dystrophic, brittle with subungual debris present and clinically mycotic x 10.  Left great toenail does not appear to be infected.  Slight  discomfort due to the thickness of the nail is noted bilateral.  Skin thickness and callus deformity is present bilateral heels posterior leg. ? ? ? ? ?Assessment  ? ?  ICD-10-CM   ?1. Pain due to onychomycosis of toenails of both feet  B35.1   ? U31.497   ? W26.378   ?  ?2. Heel callus  L84   ?  ? ? ? ?Plan ? ?Discussed exam findings with the patient and recommended debridement of the nails and calluses and the patient agreed.  This was carried out today with sterile nail nippers and a power bur without complication.  The calluses were trimmed with a #15 blade also without complication.  I recommended routine nail debridements every 3 months to keep the nails in check.  I also wrote instructions for exfoliation and hydration of his feet.  If any problems or concerns arise he will call otherwise he will be seen back in 3 months for routine care. ?

## 2022-02-21 NOTE — Patient Instructions (Signed)
Foot Exfoliation Instructions:  1)  Get a tub of warm water (about 10 cups of water or enough water to cover the bottom of your foot)  and place 1/2 cup Epsom salts in the water ( water will be a little cloudy) Soak for 15-20  minutes.  2)  remove your foot from the soak and scrub the feet with Dr. Teals epsom salt exfoliant scrub ([facility])   3)  Next use a pumice stone or a pedicure file to scrub down the rough areas of skin on your feet-  do this while your feet are still wet.  You may also prefer to go into the shower and do this while showering.  (Every other day at first , then once weekly for maintenance)  4)  cleanse all of the scrub off of your feet and dry the feet well.    5)  Apply Flexitol Heel balm or [place]efs Healthy feet foot balm after this procedure-- may also apply to feet daily or twice daily.  ** Once a week you may use Dr. Scholls Ultra exfoliating foot mask or similar ([facility], [facility], etc) prior to soaking as well.      Shopping list:  Epsom salts   Pumice stone or pedicure file   Flexitol Heel balm or O'[place]efs healthy feet foot balm    Optional-  Dr. Schools Ultra exfoliating foot mask          

## 2022-02-25 DIAGNOSIS — G4733 Obstructive sleep apnea (adult) (pediatric): Secondary | ICD-10-CM | POA: Diagnosis not present

## 2022-03-14 ENCOUNTER — Ambulatory Visit (INDEPENDENT_AMBULATORY_CARE_PROVIDER_SITE_OTHER): Payer: Medicare HMO

## 2022-03-14 DIAGNOSIS — E785 Hyperlipidemia, unspecified: Secondary | ICD-10-CM

## 2022-03-14 DIAGNOSIS — I480 Paroxysmal atrial fibrillation: Secondary | ICD-10-CM

## 2022-03-14 DIAGNOSIS — I5042 Chronic combined systolic (congestive) and diastolic (congestive) heart failure: Secondary | ICD-10-CM

## 2022-03-14 DIAGNOSIS — I10 Essential (primary) hypertension: Secondary | ICD-10-CM

## 2022-03-14 NOTE — Patient Instructions (Signed)
Visit Information ? ?Following are the goals we discussed today:  ? ?Manage My Medicine  ? ?Timeframe:  Long-Range Goal ?Priority:  High ?Start Date:  04/17/21                           ?Expected End Date:     03/15/2023               ? ?Follow Up Date October 2023 ?  ?- call for medicine refill 2 or 3 days before it runs out ?- call if I am sick and can't take my medicine ?- keep a list of all the medicines I take; vitamins and herbals too ?- use a pillbox to sort medicine  ?  ?Why is this important?   ?These steps will help you keep on track with your medicines. ? ?Plan: Telephone follow up appointment with care management team member scheduled for:  6 months ?The patient has been provided with contact information for the care management team and has been advised to call with any health related questions or concerns.  ? ?Tomasa Blase, PharmD ?Clinical Pharmacist, [place]  ? ?Please call the care guide team at [phone] if you need to cancel or reschedule your appointment.  ? ?Patient verbalizes understanding of instructions and care plan provided today and agrees to view in [place]. Active MyChart status confirmed with patient.   ? ?

## 2022-03-14 NOTE — Progress Notes (Signed)
? ?Chronic Care Management ?Pharmacy Note ? ?03/14/2022 ?Name:  [name] MRN:  [id number] DOB:  08/19/1966 ? ?Summary: ?-Patient endorses compliance to current medications ?-Has not been checking BP at home, was recently restarted on coreg by cardiology - denies any issues since restarting - BP at recent office visits has been well controlled  ?-Pacemaker procedure 01/13/2022 - feels that he has been doing well since  ?-Patient notes that he feels he does not have much energy - wants to be able to work out so that he can lose weight ? ?Recommendations/Changes made from today's visit: ?-Recommending no medication changes today, patient agreeable to purchase BP cuff to be able to monitor at home, reviewed BP goals, will reach out to office should BP control be lost / if he has issues with hypotension ?-Patient plans to discuss [place] medications with Dr. Modesta Messing - medications amongst comorbid conditions likely contributing to fatigue  ?-Advised for patient to receive shingles vaccines and tdap booster with next visit to pharmacy  ? ? ?Subjective: ?[name] is an 56 y.o. year old male who is a primary patient of [name], MD.  The CCM team was consulted for assistance with disease management and care coordination needs.   ? ?Engaged with patient by telephone for follow up visit in response to provider referral for pharmacy case management and/or care coordination services.  ? ?Consent to Services:  ?The patient was given information about Chronic Care Management services, agreed to services, and gave verbal consent prior to initiation of services.  Please see initial visit note for detailed documentation.  ? ?Patient Care Team: ?[name], MD as PCP - General (Internal Medicine) ?[name], MD as PCP - Cardiology (Cardiology) ?[name], MD as PCP - Advanced Heart Failure (Cardiology) ?Arnoldo Lenis, MD as Consulting Physician (Cardiology) ?Tomasa Blase, [facility]  (Pharmacist) ? ?Recent office visits: ?02/13/2022 - Dr. Ronnald Ramp - referral to podiatry - f/u in 6 months  ?01/23/2022 - Dr. Jenny Reichmann  - recent COVID infection - no changes to medications - f/u in 3 months  ? ?Recent consult visits: ?02/21/2022 - Dr. Valentina Lucks - Podiatry - routine care - f/u in 3 months  ?11/28/2021 - Dr. Quentin Ore - Cardiology - no changes to medications  ?11/01/2021 - Dr. Aundra Dubin - Cardiology - start coreg 3.176m BID ? ?H[facility]: ?[facility]  - was in for AICD - noted to have rapid and shallow breathing - COVID psitive- LLL pneumonia in setting of COVID infection  - started on remdesivir, Solu-Medrol, ceftriaxone, and doxycycline  ?01/13/2022 - Pacemaker implanted  ? ?Objective: ? ?Lab Results  ?Component Value Date  ? CREATININE 1.32 01/23/2022  ? BUN 20 01/23/2022  ? GFR 61.66 01/23/2022  ? GFRNONAA 57 (L) 01/16/2022  ? GFRAA >60 05/15/2020  ? NA 138 01/23/2022  ? K 4.0 01/23/2022  ? CALCIUM 8.6 01/23/2022  ? CO2 25 01/23/2022  ? ? ?Lab Results  ?Component Value Date/Time  ? HGBA1C 6.3 01/23/2022 03:00 PM  ? HGBA1C 5.3 01/07/2022 12:15 PM  ? HGBA1C 8.7 05/25/2018 12:00 AM  ? HGBA1C 8.7 05/25/2018 12:00 AM  ? HGBA1C 6.9 (H) 12/25/2015 10:55 AM  ? GFR 61.66 01/23/2022 03:00 PM  ? GFR 59.20 (L) 07/08/2021 11:58 AM  ? MICROALBUR 0.8 02/13/2022 02:14 PM  ? MICROALBUR 2.1 (H) 11/19/2020 02:54 PM  ?  ?Last diabetic Eye exam:  ?Lab Results  ?Component Value Date/Time  ? HMDIABEYEEXA No Retinopathy 01/26/2019  12:00 AM  ?  ?Last diabetic Foot exam: No results found for: HMDIABFOOTEX  ? ?Lab Results  ?Component Value Date  ? CHOL 142 01/07/2022  ? HDL 40 (L) 01/07/2022  ? Angelina 75 01/07/2022  ? LDLDIRECT 114.0 03/21/2020  ? TRIG 133 01/07/2022  ? CHOLHDL 3.6 01/07/2022  ? ? ? ?  Latest Ref Rng & Units 01/23/2022  ?  3:00 PM 01/14/2022  ?  4:15 AM 01/13/2022  ? 10:20 PM  ?Hepatic Function  ?Total Protein 6.0 - 8.3 g/dL 7.4   7.0   6.7    ?Albumin 3.5 - 5.2 g/dL 3.7   3.4   3.3    ?AST 0 -  37 U/L 28   28   28     ?ALT 0 - 53 U/L 29   26   28     ?Alk Phosphatase 39 - 117 U/L 120   107   105    ?Total Bilirubin 0.2 - 1.2 mg/dL 1.1   0.6   0.6    ?Bilirubin, Direct 0.0 - 0.3 mg/dL 0.3      ? ? ?Lab Results  ?Component Value Date/Time  ? TSH 2.22 01/23/2022 03:00 PM  ? TSH 2.183 11/01/2021 11:44 AM  ? TSH 1.53 07/08/2021 11:58 AM  ? ? ? ?  Latest Ref Rng & Units 01/23/2022  ?  3:00 PM 01/15/2022  ? 12:55 AM 01/14/2022  ?  4:15 AM  ?CBC  ?WBC 4.0 - 10.5 K/uL 7.2   6.7   8.7    ?Hemoglobin 13.0 - 17.0 g/dL 16.0   15.8   14.8    ?Hematocrit 39.0 - 52.0 % 46.7   43.2   40.7    ?Platelets 150.0 - 400.0 K/uL 182.0   183   195    ? ? ?Lab Results  ?Component Value Date/Time  ? VD25OH 44.92 11/19/2020 02:54 PM  ? VD25OH 48.62 04/12/2019 10:41 AM  ? ? ?Clinical ASCVD: No  ?The 10-year ASCVD risk score (Arnett DK, et al., 2019) is: 15.4% ?  Values used to calculate the score: ?    Age: 40 years ?    Sex: Male ?    Is Non-Hispanic African American: Yes ?    Diabetic: Yes ?    Tobacco smoker: No ?    Systolic Blood Pressure: 157 mmHg ?    Is BP treated: Yes ?    HDL Cholesterol: 40 mg/dL ?    Total Cholesterol: 142 mg/dL   ? ? ?  01/23/2022  ?  2:30 PM 11/12/2021  ?  2:14 PM 09/10/2021  ?  2:21 PM  ?Depression screen PHQ 2/9  ?Decreased Interest 0    ?Down, Depressed, Hopeless 0    ?PHQ - 2 Score 0    ?Altered sleeping     ?Tired, decreased energy     ?Change in appetite     ?Feeling bad or failure about yourself      ?Trouble concentrating     ?Moving slowly or fidgety/restless     ?Suicidal thoughts     ?PHQ-9 Score     ?Difficult doing work/chores     ?  ? Information is confidential and restricted. Go to Review Flowsheets to unlock data.  ?  ?CHA2DS2-VASc Score = 3  ?The patient's score is based upon: ?CHF History: 1 ?HTN History: 1 ?Diabetes History: 1 ?Stroke History: 0 ?Vascular Disease History: 0 ?Age Score: 0 ?Gender Score: 0 ?    ? ? ?  Social History  ? ?Tobacco Use  ?Smoking Status Former  ? Packs/day: 0.50  ?  Years: 30.00  ? Pack years: 15.00  ? Types: Cigarettes  ? Quit date: 05/15/2020  ? Years since quitting: 1.8  ?Smokeless Tobacco Never  ?Tobacco Comments  ? vaping - nicotine-free products  ? ?BP Readings from Last 3 Encounters:  ?02/13/22 118/76  ?01/23/22 118/74  ?01/16/22 126/89  ? ?Pulse Readings from Last 3 Encounters:  ?02/13/22 76  ?01/23/22 73  ?01/16/22 73  ? ?Wt Readings from Last 3 Encounters:  ?02/13/22 (!) 354 lb (160.6 kg)  ?01/23/22 (!) 355 lb (161 kg)  ?01/16/22 (!) 359 lb 9.1 oz (163.1 kg)  ? ?BMI Readings from Last 3 Encounters:  ?02/13/22 52.28 kg/m?  ?01/23/22 52.42 kg/m?  ?01/16/22 53.10 kg/m?  ? ?Assessment/Interventions: Review of patient past medical history, allergies, medications, health status, including review of consultants reports, laboratory and other test data, was performed as part of comprehensive evaluation and provision of chronic care management services.  ? ?SDOH:  (Social Determinants of Health) assessments and interventions performed: Yes ?  ?SDOH Screenings  ? ?Alcohol Screen: Not on file  ?Depression (PHQ2-9): Low Risk   ? PHQ-2 Score: 0  ?Financial Resource Strain: Not on file  ?Food Insecurity: Not on file  ?Housing: Not on file  ?Physical Activity: Not on file  ?Social Connections: Not on file  ?Stress: Not on file  ?Tobacco Use: Medium Risk  ? Smoking Tobacco Use: Former  ? Smokeless Tobacco Use: Never  ? Passive Exposure: Not on file  ?Transportation Needs: Not on file  ? ? ?[place] ? ?Allergies  ?Allergen Reactions  ? Ace Inhibitors Anaphylaxis and Swelling  ?  Angioedema  ? Bidil [Isosorb Dinitrate-Hydralazine] Other (See Comments)  ?  headache  ? Digoxin And Related   ?  Unspecified "side effects"  ? Buspirone Other (See Comments)  ?  dizziness  ? ? ?Medications Reviewed Today   ? ? Reviewed by Tomasa Blase, [facility] (Pharmacist) on 03/14/22 at 1004  Med List Status: <None>  ? ?Medication Order Taking? Sig Documenting Provider Last Dose Status Informant   ?albuterol (VENTOLIN HFA) 108 (90 Base) MCG/ACT inhaler [id number]  Inhale 1-2 puffs into the lungs every 4 (four) hours as needed for shortness of breath or wheezing. [provider]  Active Self  ?amiodarone (PACERONE)

## 2022-03-28 DIAGNOSIS — G4733 Obstructive sleep apnea (adult) (pediatric): Secondary | ICD-10-CM | POA: Diagnosis not present

## 2022-03-30 ENCOUNTER — Other Ambulatory Visit (HOSPITAL_COMMUNITY): Payer: Self-pay | Admitting: Family Medicine

## 2022-03-30 DIAGNOSIS — E785 Hyperlipidemia, unspecified: Secondary | ICD-10-CM

## 2022-03-30 DIAGNOSIS — I428 Other cardiomyopathies: Secondary | ICD-10-CM

## 2022-03-30 DIAGNOSIS — I480 Paroxysmal atrial fibrillation: Secondary | ICD-10-CM

## 2022-03-30 DIAGNOSIS — I10 Essential (primary) hypertension: Secondary | ICD-10-CM

## 2022-03-30 DIAGNOSIS — I5042 Chronic combined systolic (congestive) and diastolic (congestive) heart failure: Secondary | ICD-10-CM

## 2022-04-11 ENCOUNTER — Ambulatory Visit (HOSPITAL_COMMUNITY)
Admission: RE | Admit: 2022-04-11 | Discharge: 2022-04-11 | Disposition: A | Discharge status: Home / Self Care | Payer: Medicare HMO | Source: Ambulatory Visit | Attending: Cardiology | Admitting: Cardiology

## 2022-04-11 VITALS — BP 112/76 | HR 73 | Ht 69.0 in | Wt 358.4 lb

## 2022-04-11 DIAGNOSIS — Z7901 Long term (current) use of anticoagulants: Secondary | ICD-10-CM | POA: Insufficient documentation

## 2022-04-11 DIAGNOSIS — E785 Hyperlipidemia, unspecified: Secondary | ICD-10-CM | POA: Diagnosis not present

## 2022-04-11 DIAGNOSIS — I5042 Chronic combined systolic (congestive) and diastolic (congestive) heart failure: Secondary | ICD-10-CM | POA: Diagnosis not present

## 2022-04-11 DIAGNOSIS — I428 Other cardiomyopathies: Secondary | ICD-10-CM | POA: Insufficient documentation

## 2022-04-11 DIAGNOSIS — I480 Paroxysmal atrial fibrillation: Secondary | ICD-10-CM | POA: Insufficient documentation

## 2022-04-11 DIAGNOSIS — I4901 Ventricular fibrillation: Secondary | ICD-10-CM | POA: Diagnosis not present

## 2022-04-11 DIAGNOSIS — E118 Type 2 diabetes mellitus with unspecified complications: Secondary | ICD-10-CM | POA: Diagnosis not present

## 2022-04-11 DIAGNOSIS — Z86711 Personal history of pulmonary embolism: Secondary | ICD-10-CM | POA: Insufficient documentation

## 2022-04-11 DIAGNOSIS — Z87891 Personal history of nicotine dependence: Secondary | ICD-10-CM | POA: Diagnosis not present

## 2022-04-11 DIAGNOSIS — Z79899 Other long term (current) drug therapy: Secondary | ICD-10-CM | POA: Insufficient documentation

## 2022-04-11 DIAGNOSIS — Z7984 Long term (current) use of oral hypoglycemic drugs: Secondary | ICD-10-CM | POA: Diagnosis not present

## 2022-04-11 DIAGNOSIS — E781 Pure hyperglyceridemia: Secondary | ICD-10-CM | POA: Diagnosis not present

## 2022-04-11 DIAGNOSIS — I110 Hypertensive heart disease with heart failure: Secondary | ICD-10-CM | POA: Diagnosis not present

## 2022-04-11 DIAGNOSIS — Z95810 Presence of automatic (implantable) cardiac defibrillator: Secondary | ICD-10-CM | POA: Diagnosis not present

## 2022-04-11 DIAGNOSIS — G4733 Obstructive sleep apnea (adult) (pediatric): Secondary | ICD-10-CM | POA: Diagnosis not present

## 2022-04-11 DIAGNOSIS — I5022 Chronic systolic (congestive) heart failure: Secondary | ICD-10-CM

## 2022-04-11 LAB — HEMOGLOBIN A1C
Hgb A1c MFr Bld: 5.3 % (ref 4.8–5.6)
Mean Plasma Glucose: 105.41 mg/dL

## 2022-04-11 LAB — COMPREHENSIVE METABOLIC PANEL
ALT: 22 U/L (ref 0–44)
AST: 21 U/L (ref 15–41)
Albumin: 3.3 g/dL — ABNORMAL LOW (ref 3.5–5.0)
Alkaline Phosphatase: 106 U/L (ref 38–126)
Anion gap: 8 (ref 5–15)
BUN: 16 mg/dL (ref 6–20)
CO2: 23 mmol/L (ref 22–32)
Calcium: 9 mg/dL (ref 8.9–10.3)
Chloride: 110 mmol/L (ref 98–111)
Creatinine, Ser: 1.37 mg/dL — ABNORMAL HIGH (ref 0.61–1.24)
GFR, Estimated: >60 mL/min (ref >60)
Glucose, Bld: 135 mg/dL — ABNORMAL HIGH (ref 70–99)
Potassium: 3.3 mmol/L — ABNORMAL LOW (ref 3.5–5.1)
Sodium: 141 mmol/L (ref 135–145)
Total Bilirubin: 1.2 mg/dL (ref 0.3–1.2)
Total Protein: 7.4 g/dL (ref 6.5–8.1)

## 2022-04-11 LAB — LIPID PANEL
Cholesterol: 177 mg/dL (ref 0–200)
HDL: 39 mg/dL — ABNORMAL LOW (ref >40)
LDL Cholesterol: 113 mg/dL — ABNORMAL HIGH (ref 0–99)
Total CHOL/HDL Ratio: 4.5 RATIO
Triglycerides: 127 mg/dL (ref <150)
VLDL: 25 mg/dL (ref 0–40)

## 2022-04-11 LAB — BRAIN NATRIURETIC PEPTIDE: B Natriuretic Peptide: 267.8 pg/mL — ABNORMAL HIGH (ref 0.0–100.0)

## 2022-04-11 LAB — TSH: TSH: 1.758 u[IU]/mL (ref 0.350–4.500)

## 2022-04-11 MED ORDER — LOSARTAN POTASSIUM 25 MG PO TABS: 25.0000 mg | ORAL_TABLET | Freq: Every day | ORAL | 3 refills | Status: DC

## 2022-04-11 NOTE — Patient Instructions (Signed)
Labs done today. We will contact you only if your labs are abnormal. ? ?INCREASE Losartan to '25mg'$  (1 tablet) by mouth daily at bedtime.  ? ?No other medication changes were made. Please continue all current medications as prescribed. ? ?You have been referred to Cardiac Rehab in [place]. They will contact you to schedule an appointment.  ? ?You have been referred to the Pharmacy Clinic for [facility]. They will contact you to schedule an appointment.  ? ?Your physician recommends that you schedule a follow-up appointment in: 3 months with an echo prior to your appointment. ? ?Your physician has requested that you have an echocardiogram. Echocardiography is a painless test that uses sound waves to create images of your heart. It provides your doctor with information about the size and shape of your heart and how well your heart?s chambers and valves are working. This procedure takes approximately one hour. There are no restrictions for this procedure. ? ?If you have any questions or concerns before your next appointment please send [place] a message through [place] or call our office at [phone].   ? ?TO LEAVE A MESSAGE FOR THE NURSE SELECT OPTION 2, PLEASE LEAVE A MESSAGE INCLUDING: ?YOUR NAME ?DATE OF BIRTH ?CALL BACK NUMBER ?REASON FOR CALL**this is important as we prioritize the call backs ? ?YOU WILL RECEIVE A CALL BACK THE SAME DAY AS LONG AS YOU CALL BEFORE 4:00 PM ? ? ?Do the following things EVERYDAY: ?Weigh yourself in the morning before breakfast. Write it down and keep it in a log. ?Take your medicines as prescribed ?Eat low salt foods--Limit salt (sodium) to 2000 mg per day.  ?Stay as active as you can everyday ?Limit all fluids for the day to less than 2 liters ? ? ?At the [facility], you and your health needs are our priority. As part of our continuing mission to provide you with exceptional heart care, we have created designated Provider Care Teams. These Care Teams include your  primary Cardiologist (physician) and Advanced Practice Providers (APPs- Physician Assistants and Nurse Practitioners) who all work together to provide you with the care you need, when you need it.  ? ?You may see any of the following providers on your designated Care Team at your next follow up: ?Dr Glori Bickers ?Dr [name] ?[name], NP ?Lyda Jester, PA ?Audry Riles, PharmD ? ? ?Please be sure to bring in all your medications bottles to every appointment.  ? ?

## 2022-04-13 NOTE — Progress Notes (Signed)
?Advanced Heart Failure Clinic Note  ? ?Primary Care:Dr. Scarlette Calico ?Primary Cardiologist: Dr. Aundra Dubin  ? ?HPI: [name] is a 56 y.o. male with a past medical history of NICM, EF 25-30% in March 2018, felt to be related to prior ETOH abuse. He also has a history of PE 03/2014 completed a years course of Xarelto, morbid obesity, OSA, and tobacco abuse.  ?  ?He was admitted 11/12/15-11/14/15 with acute on chronic systolic CHF and palpitations. He wore a 30 day event monitor at discharge as he had frequent PVC's and questionable Afib on telemetry. Also with some NSVT, so he was started on Amiodaone, but at follow up had not started taking it. He had previously refused ICD and was seen inpatient by Dr. Rayann Heman who felt that his morbid obesity was a prohibitive factor.  ?  ?He was seen in the clinic in April 2018. He had started drinking ETOH again. Volume status was stable, he is not an Entresto candidate due to angioedema with lisinopril. Weight was 411 pounds.  ?  ?Admitted 04/12/17 with SOB, chest pain. D- dimer was 1.16, chest CT without central obstructing PE, however more peripheral and subsegmental pulmonary artery branches were not confidently evaluated due to his body habitus. He was started on a heparin gtt for presumed PE, however VQ scan showed no PE. Troponin was elevated, peaked at 3.37. LHC showed no CAD. Echo showed an EF of 15%, grade 2 DD, no pericarditis. He was diuresed with IV lasix, and started on torsemide 44m at discharge. Discharge weight was 405 pounds.  ? ?Admitted 5/22 through 04/24/2018 with abdominal pain, nausea, and vomiting. 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 Did not require surgical intervention. He will have follow up with GI.  ? ?Echo in 2/21 with EF 20-25%, severe LV dilation.  ? ?He has remarried and now lives in [place]  ? ?Presented 05/09/21 w/ SOB 2/2 acute CHF w ? PNA.  CTA showed multifocal ?PNA, no PE. Also in Afib w/ RVR in 150s on admit. Initially required bipap, developed  hemoptysis and intubated. Treated w/ IV Lasix. Echo 05/10/21: LVEF < 20%. RV moderately reduced. Improved and was extubated 6/13. Went into Afib w/ RVR. Refused to wear BiPAP. Developed severe agitation/hypoxia and re-intubated.  Went into VT>>VF arrest overnight ACLS>>ROSC after 146m. Developed refractory AFib again next morning requiring emergent cardioversion 05/16/21. He was transitioned to PO amiodarone. HR remained in 40-50's, metoprolol and sildenafil were stopped. Discharge weight 367 lbs. ? ?St Jude CRT-D device placed in 2/23.  ? ?He returns today for followup of CHF and atrial fibrillation.  Weight unchanged.  Generally fatigued.  Walking more recently without dyspnea, but pulled his hamstring.  No chest pain.  No orthopnea/PND.  No BRBPR/melena.  He is not smoking and rarely drinks. No palpitations. Using Bipap at night.  ? ?ECG (personally reviewed): NSR, LBBB, 156 msec ? ?Labs (5/13): K 4.1, creatinine 1.05 ?Labs (1/14): K 3.8, creatinine 1.16, BNP 54 ?Labs (2/14): K 3.7, creatinine 1.11, BNP 28 ?Labs (2/16): K 4.1, creatinine 1.03, LDL 96, HCT 40 ?Labs (3/16): K 3.7, K 1.13, BNP 367 ?Labs (8/16): BNP 43, K 3.5, creatinine 1.01 ?Labs (08/13/15): K 3.7, creatinine 1.18, HCT 41.3 ?Labs (12/16): K 3.7, creatinine 1.14, TGs 495, digoxin < 0.2 ?Labs (2/17): K 3.8, creatinine 0.99, LDL 107, TGs 222 ?Labs (5/17): K 4, creatinine 1.47 ?Labs (2/18): K 3.9, creatinine 1.06, hgb 15.1, TGs 163, LDL 89, HDL 28, TSH normal  ?Labs (5/18): K 3.8, creatinine 1.12.  ?Labs (  12/30/2017): K 3.8 Creatinine 1.25  ?Labs (01/28/2018): K 3.8 Creatinine 1.08 ?Labs 03/19/2018: K 4.1 Creatinine 1/15 BNP 212  ?Labs 04/29/3018: Creatinine 1.1  ?Labs (10/19): LDL 166, K 3.8, creatinine 1.01, AST 102 => 25, ALT 125 => 37, alkaline phosphatase 233 ?Labs (11/20): LDL 106, TGs 232 ?Labs (2/21): K 3.3, creatinine 1.3 ?Labs (4/21): LDL 114, TGs 466, K 3.6, creatinine 1.22 ?Labs (12/21): K 3.5, creatinine 1.08, LDL 164, TGs 133 ?Labs (722): K 3.8,  creatinine 1.4 ?Labs (8/22): K 3.8, creatinine 1.23 ?Labs (9/22): K 3.6, creatinine 1.26 ?Labs (2/23): LFTs normal, TSH normal, K 4, creatinine 1.3 ? ?PMH: ?1. Nonischemic cardiomyopathy: Prior cath with no significant CAD.  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  Prior echoes with EF as low as 25%.  Echo (9/13) with EF 35-40%, moderate to severe LV dilation, diffuse hypokinesis, mild MR. Echo (5/15) with EF 30-35%, moderate to severe LAE, normal RV size and systolic function.  Angioedema with ACEI, headaches with hydralazine/nitrates. Echo (3/16) with EF 25-30%, severe LV dilation, normal RV size and systolic function.  [facility] 08/13/15 showed no significant coronary disease; RA mean 6, PA 33/11 mean 23, PCWP mean 13, Fick CO/CI 4.75 /1.68 (difficult study, radial artery spasm, if needs future cath would use groin).  Echo (9/16) showed EF 20-25%.   ?- Echo (3/18): EF 25-30%, moderate LAE ?- Echo 4/18 EF 15% ?- Echo 7/19 EF 20-25% ?- Echo 2/21 with EF 20-25%, severe LV dilation.  ?- Echo 6/22 with EF < 20%, normal RV. ?- St Jude CRT-D 2/23.  ?2. HTN: angioedema with ACEI.  ?3. OSA: on Bipap ?4. Morbid obesity ?5. Paroxysmal atrial fibrillation: Urgent DCCV 6/22. ?6. Smoker.  ?7. Anxiety/panic attacks ?8. PE: 5/15, diagnosed by V/Q scan. CTA chest 8/16 negative for PE.  ?9. NSVT, PVCs: 30 day monitor (12/16) with PVCs, PACs, no atrial fibrillation.  ?- Zio patch (9/19): few short NSVT runs, no atrial fibrillation, 1.1% PVCs ?10. Hematuria: Apparently had negative workup by urology.  ?11. ABIs (6/16) were normal ?12. 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  ?13. Gout ?14. Low back pain.  ?15. VT/VF 6/22 ?  ?SH: Married, lives in [place], drinks ETOH occasionally, no drugs, no smoking.  Has son and daughter.   ?  ?FH: No premature CAD.   ? ?Review of systems complete and found to be negative unless listed in HPI.  ? ?Current Outpatient Medications  ?Medication Sig Dispense  Refill  ? albuterol (VENTOLIN HFA) 108 (90 Base) MCG/ACT inhaler Inhale 1-2 puffs into the lungs every 4 (four) hours as needed for shortness of breath or wheezing.    ? amiodarone (PACERONE) 200 MG tablet TAKE 1 TABLET (200 MG TOTAL) BY MOUTH DAILY. PATIENT TAKING ONCE DAILY 90 tablet 1  ? ARIPiprazole (ABILIFY) 10 MG tablet Take 1 tablet (10 mg total) by mouth daily. 90 tablet 0  ? atorvastatin (LIPITOR) 40 MG tablet Take 1 tablet (40 mg total) by mouth daily. 100 tablet 1  ? blood glucose meter kit and supplies KIT Use to check blood sugar. DX E11.9 1 each 0  ? buPROPion (WELLBUTRIN XL) 150 MG 24 hr tablet Take 1 tablet (150 mg total) by mouth daily. 90 tablet 0  ? carvedilol (COREG) 3.125 MG tablet Take 1 tablet (3.125 mg total) by mouth 2 (two) times daily. 60 tablet 6  ? clonazePAM (KLONOPIN) 1 MG tablet Take 1 tablet (1 mg total) by mouth 2 (two) times  daily as needed for anxiety. 60 tablet 1  ? dapagliflozin propanediol (FARXIGA) 10 MG TABS tablet Take 1 tablet (10 mg total) by mouth daily. 90 tablet 1  ? pantoprazole (PROTONIX) 40 MG tablet Take 1 tablet (40 mg total) by mouth daily. 90 tablet 1  ? potassium chloride SA (KLOR-CON M) 20 MEQ tablet Take 1 tablet (20 mEq total) by mouth daily. 60 tablet 11  ? PRESCRIPTION MEDICATION Inhale into the lungs See admin instructions. Bipap, pressure 16/12 with 2L of O2 - use whenever sleeping    ? rivaroxaban (XARELTO) 20 MG TABS tablet Take 1 tablet (20 mg total) by mouth daily with supper. 90 tablet 1  ? sertraline (ZOLOFT) 100 MG tablet Take 1.5 tablets (150 mg total) by mouth daily. 135 tablet 1  ? sildenafil (VIAGRA) 50 MG tablet Take 1 tablet (50 mg total) by mouth daily as needed for erectile dysfunction (begin with one tablet --may take 2 if necessary). 15 tablet 5  ? spironolactone (ALDACTONE) 25 MG tablet TAKE 1 TABLET (25 MG TOTAL) BY MOUTH DAILY. 90 tablet 1  ? torsemide (DEMADEX) 20 MG tablet Take 2 tablets (40 mg total) by mouth daily. 60 tablet 1  ?  losartan (COZAAR) 25 MG tablet Take 1 tablet (25 mg total) by mouth at bedtime. 90 tablet 3  ? ?No current facility-administered medications for this encounter.  ? ?Allergies  ?Allergen Reactions  ? Ace Inh

## 2022-04-14 ENCOUNTER — Telehealth (HOSPITAL_COMMUNITY): Payer: Self-pay | Admitting: Surgery

## 2022-04-14 ENCOUNTER — Telehealth: Payer: Self-pay

## 2022-04-14 ENCOUNTER — Ambulatory Visit: Payer: Medicare HMO | Admitting: Psychiatry

## 2022-04-14 NOTE — Telephone Encounter (Signed)
-----   Message from [name], MD sent at 04/13/2022 10:56 PM EDT ----- ?Increase dietary K ?

## 2022-04-14 NOTE — Telephone Encounter (Signed)
I called patient to review results and recommendations per provider.  Patient is aware and agreeable. 

## 2022-04-14 NOTE — Progress Notes (Signed)
? ? ?Chronic Care Management ?Pharmacy Assistant  ? ?Name: [name]  MRN: [id number] DOB: 29-Sep-1966 ? ? ?Reason for Encounter: Disease State-General ?  ?Recent office visits:  ?None since last coordination call 03/12/22 ? ?Recent consult visits:  ?04/11/22 [name], MD-Cardiology ([facility]) Blood work done: B Nat peptide, cmp,lipid panel; Medication changes: Increase losartan to 25 mg at bedtime ? ?Hospital visits:  ?None since last coordination call 03/12/22 ? ?Medications: ?Outpatient Encounter Medications as of 04/14/2022  ?Medication Sig  ? albuterol (VENTOLIN HFA) 108 (90 Base) MCG/ACT inhaler Inhale 1-2 puffs into the lungs every 4 (four) hours as needed for shortness of breath or wheezing.  ? amiodarone (PACERONE) 200 MG tablet TAKE 1 TABLET (200 MG TOTAL) BY MOUTH DAILY. PATIENT TAKING ONCE DAILY  ? ARIPiprazole (ABILIFY) 10 MG tablet Take 1 tablet (10 mg total) by mouth daily.  ? atorvastatin (LIPITOR) 40 MG tablet Take 1 tablet (40 mg total) by mouth daily.  ? blood glucose meter kit and supplies KIT Use to check blood sugar. DX E11.9  ? buPROPion (WELLBUTRIN XL) 150 MG 24 hr tablet Take 1 tablet (150 mg total) by mouth daily.  ? carvedilol (COREG) 3.125 MG tablet Take 1 tablet (3.125 mg total) by mouth 2 (two) times daily.  ? clonazePAM (KLONOPIN) 1 MG tablet Take 1 tablet (1 mg total) by mouth 2 (two) times daily as needed for anxiety.  ? dapagliflozin propanediol (FARXIGA) 10 MG TABS tablet Take 1 tablet (10 mg total) by mouth daily.  ? losartan (COZAAR) 25 MG tablet Take 1 tablet (25 mg total) by mouth at bedtime.  ? pantoprazole (PROTONIX) 40 MG tablet Take 1 tablet (40 mg total) by mouth daily.  ? potassium chloride SA (KLOR-CON M) 20 MEQ tablet Take 1 tablet (20 mEq total) by mouth daily.  ? PRESCRIPTION MEDICATION Inhale into the lungs See admin instructions. Bipap, pressure 16/12 with 2L of O2 - use whenever sleeping  ? rivaroxaban (XARELTO) 20 MG TABS tablet Take 1 tablet (20 mg total) by  mouth daily with supper.  ? sertraline (ZOLOFT) 100 MG tablet Take 1.5 tablets (150 mg total) by mouth daily.  ? sildenafil (VIAGRA) 50 MG tablet Take 1 tablet (50 mg total) by mouth daily as needed for erectile dysfunction (begin with one tablet --may take 2 if necessary).  ? spironolactone (ALDACTONE) 25 MG tablet TAKE 1 TABLET (25 MG TOTAL) BY MOUTH DAILY.  ? torsemide (DEMADEX) 20 MG tablet Take 2 tablets (40 mg total) by mouth daily.  ? [DISCONTINUED] pravastatin (PRAVACHOL) 40 MG tablet Take 40 mg by mouth daily.  ? ?No facility-administered encounter medications on file as of 04/14/2022.  ? ?Contacted [name] for General Review Call ? ? ?Chart Review: ? ?Have there been any documented new, changed, or discontinued medications since last visit? Yes (If yes, include name, dose, frequency, date)Increase losartan to 25 mg at bedtime ? ?Has there been any documented recent hospitalizations or ED visits since last visit with Clinical Pharmacist? No ?Brief Summary (including medication and/or Diagnosis changes): ? ? ?Adherence Review: ? ?Does the Clinical Pharmacist Assistant have access to adherence rates? Yes ?Adherence rates for STAR metric medications (List medication(s)/day supply/ last 2 fill dates). ?Adherence rates for medications indicated for disease state being reviewed (List medication(s)/day supply/ last 2 fill dates). ?Does the patient have >5 day gap between last estimated fill dates for any of the above medications or other medication gaps? No ?Reason for medication gaps. ? ? ?  Disease State Questions: ? ?Able to connect with Patient? Yes ?Did patient have any problems with their health recently? No ?Note problems and Concerns: ?Have you had any admissions or emergency room visits or worsening of your condition(s) since last visit? No, none since last coordination call ?Details of ED visit, hospital visit and/or worsening condition(s): ?Have you had any visits with new specialists or providers  since your last visit? Yes ?Explain:F/u to CHF  ?Have you had any new health care problem(s) since your last visit? No ?New problem(s) reported: ?Have you run out of any of your medications since you last spoke with clinical pharmacist? No ?What caused you to run out of your medications? ?Are there any medications you are not taking as prescribed? No ?What kept you from taking your medications as prescribed? ?Are you having any issues or side effects with your medications? No ?Note of issues or side effects: ?Do you have any other health concerns or questions you want to discuss with your Clinical Pharmacist before your next visit? No ?Note additional concerns and questions from Patient. ?Are there any health concerns that you feel we can do a better job addressing? No ?Note Patient's response. ?Are you having any problems with any of the following since the last visit: (select all that apply) ? None ? Details: ?12. Any falls since last visit? No ? Details: ?13. Any increased or uncontrolled pain since last visit? No ? Details: ?14. Next visit Type: telephone ?      Visit with: ?       Date:09/12/22 ?       Time:11am ? ?15. Additional Details? No   ? ?Care Gaps: ?Colonoscopy-NA ?Diabetic Foot Exam-04/15/22 ?Ophthalmology-01/26/19 ?Dexa Scan - NA ?Annual Well Visit - 01/15/21 ?Micro albumin-05/16/22 ?Hemoglobin A1c- 04/11/22 ?  ?Star Rating Drugs: ?Losartan 25 mg-last fill 04/11/22 90 ds ?Atorvastatin 40 mg-last fill 03/19/22 90 ds ?  ?[name] ?Clinical Pharmacist Assistant ?[phone]  ?

## 2022-04-22 ENCOUNTER — Encounter: Payer: Medicaid Other | Admitting: Cardiology

## 2022-04-23 ENCOUNTER — Other Ambulatory Visit (HOSPITAL_COMMUNITY): Payer: Self-pay | Admitting: *Deleted

## 2022-04-23 MED ORDER — ALBUTEROL SULFATE HFA 108 (90 BASE) MCG/ACT IN AERS: 1.0000 | INHALATION_SPRAY | RESPIRATORY_TRACT | 0 refills | Status: DC | PRN

## 2022-04-24 ENCOUNTER — Ambulatory Visit (INDEPENDENT_AMBULATORY_CARE_PROVIDER_SITE_OTHER): Payer: Medicare HMO

## 2022-04-24 DIAGNOSIS — I428 Other cardiomyopathies: Secondary | ICD-10-CM

## 2022-04-24 LAB — CUP PACEART REMOTE DEVICE CHECK
Battery Remaining Longevity: 50 mo
Battery Remaining Percentage: 92 %
Battery Voltage: 2.98 V
Brady Statistic AP VP Percent: 78 %
Brady Statistic AP VS Percent: 1.9 %
Brady Statistic AS VP Percent: 20 %
Brady Statistic AS VS Percent: 1 %
Brady Statistic RA Percent Paced: 79 %
Date Time Interrogation Session: 20230525073540
HighPow Impedance: 69 Ohm
Implantable Lead Implant Date: 20230213
Implantable Lead Implant Date: 20230213
Implantable Lead Implant Date: 20230213
Implantable Lead Location: 753857
Implantable Lead Location: 753859
Implantable Lead Location: 753860
Implantable Pulse Generator Implant Date: 20230213
Lead Channel Impedance Value: 450 Ohm
Lead Channel Impedance Value: 500 Ohm
Lead Channel Impedance Value: 850 Ohm
Lead Channel Pacing Threshold Amplitude: 0.75 V
Lead Channel Pacing Threshold Amplitude: 1 V
Lead Channel Pacing Threshold Amplitude: 1 V
Lead Channel Pacing Threshold Pulse Width: 0.5 ms
Lead Channel Pacing Threshold Pulse Width: 0.5 ms
Lead Channel Pacing Threshold Pulse Width: 0.5 ms
Lead Channel Sensing Intrinsic Amplitude: 11.8 mV
Lead Channel Sensing Intrinsic Amplitude: 4.1 mV
Lead Channel Setting Pacing Amplitude: 3.5 V
Lead Channel Setting Pacing Amplitude: 3.5 V
Lead Channel Setting Pacing Amplitude: 3.5 V
Lead Channel Setting Pacing Pulse Width: 0.5 ms
Lead Channel Setting Pacing Pulse Width: 0.5 ms
Lead Channel Setting Sensing Sensitivity: 0.5 mV
Pulse Gen Serial Number: 210000080

## 2022-04-29 DIAGNOSIS — G4733 Obstructive sleep apnea (adult) (pediatric): Secondary | ICD-10-CM | POA: Diagnosis not present
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
## 2022-05-06 ENCOUNTER — Other Ambulatory Visit (HOSPITAL_COMMUNITY): Payer: Self-pay | Admitting: *Deleted

## 2022-05-06 NOTE — Progress Notes (Signed)
Remote ICD transmission.   

## 2022-05-08 ENCOUNTER — Encounter: Payer: Self-pay | Admitting: Psychiatry

## 2022-05-08 ENCOUNTER — Ambulatory Visit (INDEPENDENT_AMBULATORY_CARE_PROVIDER_SITE_OTHER): Payer: Medicare HMO | Admitting: Psychiatry

## 2022-05-08 VITALS — BP 124/81 | HR 71 | Temp 98.3°F | Wt 360.6 lb

## 2022-05-08 DIAGNOSIS — F3132 Bipolar disorder, current episode depressed, moderate: Secondary | ICD-10-CM | POA: Diagnosis not present

## 2022-05-08 DIAGNOSIS — R69 Illness, unspecified: Secondary | ICD-10-CM | POA: Diagnosis not present

## 2022-05-08 DIAGNOSIS — F4310 Post-traumatic stress disorder, unspecified: Secondary | ICD-10-CM | POA: Diagnosis not present

## 2022-05-08 MED ORDER — ARIPIPRAZOLE 15 MG PO TABS: 15.0000 mg | ORAL_TABLET | Freq: Every day | ORAL | 1 refills | Status: DC

## 2022-05-08 MED ORDER — BUPROPION HCL ER (XL) 150 MG PO TB24: 150.0000 mg | ORAL_TABLET | Freq: Every day | ORAL | 0 refills | Status: DC

## 2022-05-08 NOTE — Patient Instructions (Signed)
Continue sertraline 150 mg daily  Continue bupropion 150 mg daily Increase Abilify 15 mg daily Continue clonazepam 1 mg twice a day as needed for anxiety  Next appointment- 7/19 at 3 PM, video

## 2022-05-30 ENCOUNTER — Ambulatory Visit (HOSPITAL_COMMUNITY)
Admission: RE | Admit: 2022-05-30 | Discharge: 2022-05-30 | Disposition: A | Discharge status: Home / Self Care | Payer: Medicare HMO | Source: Ambulatory Visit | Attending: Family Medicine | Admitting: Family Medicine

## 2022-05-30 ENCOUNTER — Encounter (HOSPITAL_COMMUNITY): Payer: Self-pay

## 2022-05-30 VITALS — BP 124/78 | HR 73 | Wt 359.8 lb

## 2022-05-30 DIAGNOSIS — Z86711 Personal history of pulmonary embolism: Secondary | ICD-10-CM | POA: Diagnosis not present

## 2022-05-30 DIAGNOSIS — G4733 Obstructive sleep apnea (adult) (pediatric): Secondary | ICD-10-CM | POA: Diagnosis not present

## 2022-05-30 DIAGNOSIS — Z8679 Personal history of other diseases of the circulatory system: Secondary | ICD-10-CM | POA: Diagnosis not present

## 2022-05-30 DIAGNOSIS — I10 Essential (primary) hypertension: Secondary | ICD-10-CM | POA: Diagnosis not present

## 2022-05-30 DIAGNOSIS — Z95810 Presence of automatic (implantable) cardiac defibrillator: Secondary | ICD-10-CM | POA: Diagnosis not present

## 2022-05-30 DIAGNOSIS — I5022 Chronic systolic (congestive) heart failure: Secondary | ICD-10-CM | POA: Diagnosis not present

## 2022-05-30 DIAGNOSIS — Z7901 Long term (current) use of anticoagulants: Secondary | ICD-10-CM | POA: Diagnosis not present

## 2022-05-30 DIAGNOSIS — I480 Paroxysmal atrial fibrillation: Secondary | ICD-10-CM | POA: Diagnosis not present

## 2022-05-30 DIAGNOSIS — F419 Anxiety disorder, unspecified: Secondary | ICD-10-CM

## 2022-05-30 DIAGNOSIS — R69 Illness, unspecified: Secondary | ICD-10-CM | POA: Diagnosis not present

## 2022-05-30 DIAGNOSIS — Z79899 Other long term (current) drug therapy: Secondary | ICD-10-CM | POA: Diagnosis not present

## 2022-05-30 DIAGNOSIS — I110 Hypertensive heart disease with heart failure: Secondary | ICD-10-CM | POA: Insufficient documentation

## 2022-05-30 DIAGNOSIS — I472 Ventricular tachycardia, unspecified: Secondary | ICD-10-CM | POA: Diagnosis not present

## 2022-05-30 DIAGNOSIS — I428 Other cardiomyopathies: Secondary | ICD-10-CM | POA: Insufficient documentation

## 2022-05-30 DIAGNOSIS — E785 Hyperlipidemia, unspecified: Secondary | ICD-10-CM

## 2022-05-30 LAB — BASIC METABOLIC PANEL
Anion gap: 10 (ref 5–15)
BUN: 17 mg/dL (ref 6–20)
CO2: 22 mmol/L (ref 22–32)
Calcium: 9 mg/dL (ref 8.9–10.3)
Chloride: 107 mmol/L (ref 98–111)
Creatinine, Ser: 1.46 mg/dL — ABNORMAL HIGH (ref 0.61–1.24)
GFR, Estimated: 57 mL/min — ABNORMAL LOW (ref >60)
Glucose, Bld: 142 mg/dL — ABNORMAL HIGH (ref 70–99)
Potassium: 3.5 mmol/L (ref 3.5–5.1)
Sodium: 139 mmol/L (ref 135–145)

## 2022-05-30 LAB — BRAIN NATRIURETIC PEPTIDE: B Natriuretic Peptide: 228.1 pg/mL — ABNORMAL HIGH (ref 0.0–100.0)

## 2022-05-30 MED ORDER — METOLAZONE 2.5 MG PO TABS: 2.5000 mg | ORAL_TABLET | Freq: Every day | ORAL | 0 refills | Status: DC

## 2022-05-30 NOTE — Progress Notes (Signed)
Advanced Heart Failure Clinic Note   Primary Care:Dr. Scarlette Calico HF Cardiologist: Dr. Aundra Dubin   HPI: Mr. Kowal is a 56 y.o. male with a past medical history of NICM, EF 25-30% in March 2018, felt to be related to prior ETOH abuse. He also has a history of PE 03/2014 completed a years course of Xarelto, morbid obesity, OSA, and tobacco abuse.    He was admitted 11/12/15-11/14/15 with acute on chronic systolic CHF and palpitations. He wore a 30 day event monitor at discharge as he had frequent PVC's and questionable Afib on telemetry. Also with some NSVT, so he was started on Amiodaone, but at follow up had not started taking it. He had previously refused ICD and was seen inpatient by Dr. Rayann Heman who felt that his morbid obesity was a prohibitive factor.    He was seen in the clinic in April 2018. He had started drinking ETOH again. Volume status was stable, he is not an Entresto candidate due to angioedema with lisinopril. Weight was 411 pounds.    Admitted 04/12/17 with SOB, chest pain. D- dimer was 1.16, chest CT without central obstructing PE, however more peripheral and subsegmental pulmonary artery branches were not confidently evaluated due to his body habitus. He was started on a heparin gtt for presumed PE, however VQ scan showed no PE. Troponin was elevated, peaked at 3.37. LHC showed no CAD. Echo showed an EF of 15%, grade 2 DD, no pericarditis. He was diuresed with IV lasix, and started on torsemide 24m at discharge. Discharge weight was 405 pounds.   Admitted 5/22 through 04/24/2018 with abdominal pain, nausea, and vomiting. 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 Did not require surgical intervention. He will have follow up with GI.   Echo in 2/21 with EF 20-25%, severe LV dilation.   He has remarried and now [place] in [place]   Presented 05/09/21 w/ SOB 2/2 acute CHF w ? PNA.  CTA showed multifocal ?PNA, no PE. Also in Afib w/ RVR in 150s on admit. Initially required bipap, developed  hemoptysis and intubated. Treated w/ IV Lasix. Echo 05/10/21: LVEF < 20%. RV moderately reduced. Improved and was extubated 6/13. Went into Afib w/ RVR. Refused to wear BiPAP. Developed severe agitation/hypoxia and re-intubated.  Went into VT>>VF arrest overnight ACLS>>ROSC after 171m. Developed refractory AFib again next morning requiring emergent cardioversion 05/16/21. He was transitioned to PO amiodarone. HR remained in 40-50's, metoprolol and sildenafil were stopped. Discharge weight 367 lbs.  St Jude CRT-D device placed in 2/23.   Today he returns for an acute visit with his wife with complaints of worsening fatigue. Overall feeling poorly. Not urinating as briskly on current diuretic dose. He has dyspnea bending over and with mild activity. Occasional dizziness, no falls. Denies palpitations, CP, edema, or PND/Orthopnea. Appetite ok. No fever or chills. Weight at home 360 pounds. Taking all medications. Wearing BiPap.  REDs: 41%  Device interrogation (personally reviewed): CoreVue down suggesting volume overload  ECG (personally reviewed): none ordered today.  Labs (5/13): K 4.1, creatinine 1.05 Labs (1/14): K 3.8, creatinine 1.16, BNP 54 Labs (2/14): K 3.7, creatinine 1.11, BNP 28 Labs (2/16): K 4.1, creatinine 1.03, LDL 96, HCT 40 Labs (3/16): K 3.7, K 1.13, BNP 367 Labs (8/16): BNP 43, K 3.5, creatinine 1.01 Labs (08/13/15): K 3.7, creatinine 1.18, HCT 41.3 Labs (12/16): K 3.7, creatinine 1.14, TGs 495, digoxin < 0.2 Labs (2/17): K 3.8, creatinine 0.99, LDL 107, TGs 222 Labs (5/17): K 4, creatinine 1.47 Labs (2/18): K  3.9, creatinine 1.06, hgb 15.1, TGs 163, LDL 89, HDL 28, TSH normal  Labs (5/18): K 3.8, creatinine 1.12.  Labs (12/30/2017): K 3.8 Creatinine 1.25  Labs (01/28/2018): K 3.8 Creatinine 1.08 Labs 03/19/2018: K 4.1 Creatinine 1/15 BNP 212  Labs 04/29/3018: Creatinine 1.1  Labs (10/19): LDL 166, K 3.8, creatinine 1.01, AST 102 => 25, ALT 125 => 37, alkaline phosphatase  233 Labs (11/20): LDL 106, TGs 232 Labs (2/21): K 3.3, creatinine 1.3 Labs (4/21): LDL 114, TGs 466, K 3.6, creatinine 1.22 Labs (12/21): K 3.5, creatinine 1.08, LDL 164, TGs 133 Labs (722): K 3.8, creatinine 1.4 Labs (8/22): K 3.8, creatinine 1.23 Labs (9/22): K 3.6, creatinine 1.26 Labs (2/23): LFTs normal, TSH normal, K 4, creatinine 1.3 Labs (5/23): K 3.3, creatinine 1.37, TSH normal, LDL 113, HDL 39  PMH: 1. Nonischemic cardiomyopathy: Prior cath with no significant CAD.  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  Prior echoes with EF as low as 25%.  Echo (9/13) with EF 35-40%, moderate to severe LV dilation, diffuse hypokinesis, mild MR. Echo (5/15) with EF 30-35%, moderate to severe LAE, normal RV size and systolic function.  Angioedema with ACEI, headaches with hydralazine/nitrates. Echo (3/16) with EF 25-30%, severe LV dilation, normal RV size and systolic function.  [facility] 08/13/15 showed no significant coronary disease; RA mean 6, PA 33/11 mean 23, PCWP mean 13, Fick CO/CI 4.75 /1.68 (difficult study, radial artery spasm, if needs future cath would use groin).  Echo (9/16) showed EF 20-25%.   - Echo (3/18): EF 25-30%, moderate LAE - Echo 4/18 EF 15% - Echo 7/19 EF 20-25% - Echo 2/21 with EF 20-25%, severe LV dilation.  - Echo 6/22 with EF < 20%, normal RV. - St Jude CRT-D 2/23.  2. HTN: angioedema with ACEI.  3. OSA: on Bipap 4. Morbid obesity 5. Paroxysmal atrial fibrillation: Urgent DCCV 6/22. 6. Smoker.  7. Anxiety/panic attacks 8. PE: 5/15, diagnosed by V/Q scan. CTA chest 8/16 negative for PE.  9. NSVT, PVCs: 30 day monitor (12/16) with PVCs, PACs, no atrial fibrillation.  - Zio patch (9/19): few short NSVT runs, no atrial fibrillation, 1.1% PVCs 10. Hematuria: Apparently had negative workup by urology.  11. ABIs (6/16) were normal 12. 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  13. Gout 14. Low back pain.  20. VT/VF 6/22   SH:  Married, lives in [place], drinks ETOH occasionally, no drugs, no smoking.  Has son and daughter.     FH: No premature CAD.    Review of systems complete and found to be negative unless listed in HPI.   Current Outpatient Medications  Medication Sig Dispense Refill   albuterol (VENTOLIN HFA) 108 (90 Base) MCG/ACT inhaler Inhale 1-2 puffs into the lungs every 4 (four) hours as needed for shortness of breath or wheezing. 1 each 0   amiodarone (PACERONE) 200 MG tablet TAKE 1 TABLET (200 MG TOTAL) BY MOUTH DAILY. PATIENT TAKING ONCE DAILY 90 tablet 1   ARIPiprazole (ABILIFY) 15 MG tablet Take 1 tablet (15 mg total) by mouth daily. 30 tablet 1   atorvastatin (LIPITOR) 40 MG tablet Take 1 tablet (40 mg total) by mouth daily. 100 tablet 1   buPROPion (WELLBUTRIN XL) 150 MG 24 hr tablet Take 1 tablet (150 mg total) by mouth daily. 90 tablet 0   carvedilol (COREG) 3.125 MG tablet Take 1 tablet (3.125 mg total) by mouth 2 (two) times daily. 60 tablet 6  clonazePAM (KLONOPIN) 1 MG tablet Take 1 tablet (1 mg total) by mouth 2 (two) times daily as needed for anxiety. 60 tablet 1   dapagliflozin propanediol (FARXIGA) 10 MG TABS tablet Take 1 tablet (10 mg total) by mouth daily. 90 tablet 1   losartan (COZAAR) 25 MG tablet Take 1 tablet (25 mg total) by mouth at bedtime. 90 tablet 3   pantoprazole (PROTONIX) 40 MG tablet Take 1 tablet (40 mg total) by mouth daily. 90 tablet 1   potassium chloride SA (KLOR-CON M) 20 MEQ tablet Take 1 tablet (20 mEq total) by mouth daily. 60 tablet 11   PRESCRIPTION MEDICATION Inhale into the lungs See admin instructions. Bipap, pressure 16/12 with 2L of O2 - use whenever sleeping     rivaroxaban (XARELTO) 20 MG TABS tablet Take 1 tablet (20 mg total) by mouth daily with supper. 90 tablet 1   sertraline (ZOLOFT) 100 MG tablet Take 1.5 tablets (150 mg total) by mouth daily. 135 tablet 1   sildenafil (VIAGRA) 50 MG tablet Take 1 tablet (50 mg total) by mouth daily as needed for  erectile dysfunction (begin with one tablet --may take 2 if necessary). 15 tablet 5   spironolactone (ALDACTONE) 25 MG tablet TAKE 1 TABLET (25 MG TOTAL) BY MOUTH DAILY. 90 tablet 1   torsemide (DEMADEX) 20 MG tablet Take 2 tablets (40 mg total) by mouth daily. 60 tablet 1   blood glucose meter kit and supplies KIT Use to check blood sugar. DX E11.9 (Patient not taking: Reported on 05/30/2022) 1 each 0   No current facility-administered medications for this encounter.   Allergies  Allergen Reactions   Ace Inhibitors Anaphylaxis and Swelling    Angioedema   Bidil [Isosorb Dinitrate-Hydralazine] Other (See Comments)    headache   Digoxin And Related     Unspecified "side effects"   Buspirone Other (See Comments)    dizziness   Social History   Socioeconomic History   Marital status: Married    Spouse name: Not on file   Number of children: 3   Years of education: Not on file   Highest education level: Not on file  Occupational History   Occupation: DISABLED  Tobacco Use   Smoking status: Former    Packs/day: 0.50    Years: 30.00    Total pack years: 15.00    Types: Cigarettes    Quit date: 05/15/2020    Years since quitting: 2.0   Smokeless tobacco: Never   Tobacco comments:    vaping - nicotine-free products  Vaping Use   Vaping Use: Never used  Substance and Sexual Activity   Alcohol use: Not Currently    Alcohol/week: 0.0 standard drinks of alcohol   Drug use: No   Sexual activity: Not Currently  Other Topics Concern   Not on file  Social History Narrative   He smokes about a pack per day and he has been smoking since he was 56 years of age.  He drinks alcohol occasionally, but he denies any illicit drug abuse.  He is presently on disability.    Lives with wife in a 2 story home.  Has 2 children.   Previously worked in [employer], last worked in 1998.   Highest level of education:  11th grade           Social Determinants of Health   Financial Resource Strain:  Low Risk  (01/15/2021)   Overall Financial Resource Strain (CARDIA)    Difficulty of Paying  Living Expenses: Not hard at all  Food Insecurity: No Food Insecurity (01/15/2021)   Hunger Vital Sign    Worried About Running Out of Food in the Last Year: Never true    Ran Out of Food in the Last Year: Never true  Transportation Needs: No Transportation Needs (01/15/2021)   PRAPARE - Hydrologist (Medical): No    Lack of Transportation (Non-Medical): No  Physical Activity: Sufficiently Active (01/15/2021)   Exercise Vital Sign    Days of Exercise per Week: 5 days    Minutes of Exercise per Session: 30 min  Stress: No Stress Concern Present (01/15/2021)   [place]    Feeling of Stress : Not at all  Social Connections: Moderately Isolated (01/15/2021)   Social Connection and Isolation Panel [NHANES]    Frequency of Communication with Friends and Family: More than three times a week    Frequency of Social Gatherings with Friends and Family: Never    Attends Religious Services: Never    Marine scientist or Organizations: No    Attends Music therapist: Never    Marital Status: Married  Human resources officer Violence: Not on file   Family History  Problem Relation Age of Onset   Cancer Mother        brain tumor   Hypertension Mother    Diabetes Father        Deceased, 47   Heart disease Maternal Grandmother    Hypertension Other        Family History   Stroke Other        Family History   Diabetes Other        Family History   Diabetes Daughter    BP 124/78   Pulse 73   Wt (!) 163.2 kg (359 lb 12.8 oz)   SpO2 98%   BMI 53.13 kg/m   Wt Readings from Last 3 Encounters:  05/30/22 (!) 163.2 kg (359 lb 12.8 oz)  04/11/22 (!) 162.6 kg (358 lb 6.4 oz)  02/13/22 (!) 160.6 kg (354 lb)   PHYSICAL EXAM: General:  NAD. No resp difficulty HEENT: Normal Neck: Supple. No JVD.  Carotids 2+ bilat; no bruits. No lymphadenopathy or thryomegaly appreciated. Cor: PMI nondisplaced. Regular rate & rhythm. No rubs, gallops or murmurs. Lungs: Clear Abdomen: Obese, nontender, nondistended. No hepatosplenomegaly. No bruits or masses. Good bowel sounds. Extremities: No cyanosis, clubbing, rash, edema Neuro: Alert & oriented x 3, cranial nerves grossly intact. Moves all 4 extremities w/o difficulty. Affect pleasant.   ASSESSMENT & PLAN: 1. Atrial fibrillation: Paroyxsmal. Emergent DCCV in 6/22. Regular on exam, no palpitations.  - Continue amiodarone 200 mg daily.  LFTs and TSH normal 5/23.  Needs regular eye exam.  - Continue Xarelto. 2.  Chronic systolic CHF: Nonischemic cardiomyopathy. ?If ETOH has played a role (now drinks rarely). Echo (3/18) with EF 25-30%. He was seen by EP and decided against ICD given marked obesity.  QRS not wide enough for CRT. Echo 7/19 and in 2/21 with EF 20-25%. Echo 6/22 with EF < 20%, normal RV.  Has St Jude CRT-D device.  Worse NYHA class III, mostly due to fatigue. He is volume overloaded by CoreVue. - Take metolazone 2.5 mg + extra 40 KCL today with torsemide dose. BMET/BNP today, repeat BMET in 1 week (Rx given for labs at [facility]). - I will ask device RN to send transmission in 1 week to  follow CoreVue. - Continue torsemide 40 mg daily. - Continue spironolactone 25 mg daily. - Continue Farxiga 10 mg daily. - Continue losartan 25 mg at night. - Continue Coreg 3.125 mg bid. Consider changing to Toprol if increased fatigue remains after diuresis. - No Entresto, ACEI with h/o angioedema.  - Headaches with Bidil, cannot take.  - He has been referred to cardiac rehab at hospital in [place].  - Echo at followup in 3 months.  3. HTN: Controlled.  4. 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  Has St Jude CRT-D device now.  5. OSA: Continue nightly BiPAP. 6. PE: 04/14/2018, diagnosed by V/Q scan. Has been on Xarelto. 7. Anxiety/Panic  attacks/depression: Followed by psychiatry. 8. Hyperlipidemia: Continue atorvastatin, LDL 113 (5/23). Unclear if he was consistently taking statin when lipids last checked. 9. Obesity: Body mass index is 53.13 kg/m. - Refer to pharmacy clinic for semaglutide or tirzepatide.   Followup in 2 months with Dr. Aundra Dubin with echo, as scheduled.   Maricela Bo [facility] FNP-BC 05/30/2022

## 2022-05-30 NOTE — Patient Instructions (Signed)
Thank you for coming in today  Labs were done today, if any labs are abnormal the clinic will call you No news is good news  TAKE Metolazone 2.5 mg 1 tablet with a extra 40 meq of potassium  Your physician recommends that you return for lab work in:  1 week for lab work at [place] you have been given a prescription to get you lab work drawn  You have been referred to cardiac rehab they will contact you for further details  You have been reffered to pharmacy for medications and they will contact you for further details  At the [facility], you and your health needs are our priority. As part of our continuing mission to provide you with exceptional heart care, we have created designated Provider Care Teams. These Care Teams include your primary Cardiologist (physician) and Advanced Practice Providers (APPs- Physician Assistants and Nurse Practitioners) who all work together to provide you with the care you need, when you need it.   You may see any of the following providers on your designated Care Team at your next follow up: Dr Glori Bickers Dr Haynes Kerns, NP Lyda Jester, [place] [facility] [place], [place] Audry Riles, PharmD   Please be sure to bring in all your medications bottles to every appointment.   If you have any questions or concerns before your next appointment please send [place] a message through [place] or call our office at [phone].    TO LEAVE A MESSAGE FOR THE NURSE SELECT OPTION 2, PLEASE LEAVE A MESSAGE INCLUDING: YOUR NAME DATE OF BIRTH CALL BACK NUMBER REASON FOR CALL**this is important as we prioritize the call backs  YOU WILL RECEIVE A CALL BACK THE SAME DAY AS LONG AS YOU CALL BEFORE 4:00 PM

## 2022-05-30 NOTE — Progress Notes (Signed)
ReDS Vest / Clip - 05/30/22 1000       ReDS Vest / Clip   Station Marker D    Ruler Value 36    ReDS Value Range High volume overload    ReDS Actual Value 41

## 2022-06-02 ENCOUNTER — Other Ambulatory Visit (HOSPITAL_COMMUNITY): Payer: Self-pay | Admitting: Cardiology

## 2022-06-04 ENCOUNTER — Other Ambulatory Visit (HOSPITAL_COMMUNITY): Payer: Self-pay | Admitting: Cardiology

## 2022-06-04 ENCOUNTER — Telehealth: Payer: Self-pay | Admitting: Pharmacist

## 2022-06-04 NOTE — Telephone Encounter (Signed)
Pt re-referred for GLP1RA therapy by CHF clinic. Previously referred in Feb 2023 but pt declined at that time. He has Medicare and Medicaid insurance which do not cover weight loss medications like Wegovy. He does not have DM so they will not cover Ozempic or Mounjaro either. Called pt to let him know, he was appreciative for the call.

## 2022-06-06 ENCOUNTER — Ambulatory Visit (INDEPENDENT_AMBULATORY_CARE_PROVIDER_SITE_OTHER): Payer: Medicare HMO

## 2022-06-06 ENCOUNTER — Telehealth: Payer: Self-pay

## 2022-06-06 ENCOUNTER — Other Ambulatory Visit: Payer: Self-pay | Admitting: Psychiatry

## 2022-06-06 DIAGNOSIS — I5022 Chronic systolic (congestive) heart failure: Secondary | ICD-10-CM

## 2022-06-06 DIAGNOSIS — Z95810 Presence of automatic (implantable) cardiac defibrillator: Secondary | ICD-10-CM

## 2022-06-06 DIAGNOSIS — F3132 Bipolar disorder, current episode depressed, moderate: Secondary | ICD-10-CM

## 2022-06-06 NOTE — Progress Notes (Signed)
Received: Today [place], Maricela Bo, FNP  Talishia Betzler Panda, RN No med changes for now. Yes, please enroll in monthly fluid monitoring.  Thanks Margarita Grizzle!

## 2022-06-06 NOTE — Telephone Encounter (Signed)
Spoke with patient and explained reason for ICM call.  [place], NP at [facility] clinic would like to recheck his fluid levels today on remote transmission.  He has the app on his phone and will send report now.  See ICM note for transmission review.

## 2022-06-06 NOTE — Progress Notes (Signed)
EPIC Encounter for ICM Monitoring  Patient Name: [name] is a 56 y.o. male Date: 06/06/2022 Primary Care Physican: [name], MD Primary Cardiologist: Aundra Dubin Electrophysiologist: Marisa Sprinkles Pacing: 98% 06/06/2022 Weight: 360 lbs       ICM recheck for HF clinic.  Heart Failure questions reviewed.  Pt reports sluggishness he was feeling last week has resolved.  He denies any fluid symptoms today.  He did not have a significant amount of urine output after taking Metolazone last week.   CorVue thoracic impedance suggesting possible fluid accumulation starting 7/5 but trending back toward baseline.   Prescribed:  Torsemide 20 mg Take 2 tablets (40 mg total) by mouth daily. Potassium 20 mEq take 1 tablet(s) (20 mEq total) by mouth.  Labs: 05/30/2022 Creatinine 1.46, BUN 17, Potassium 3.5, Sodium 139, GFR 57 A complete set of results can be found in Results Review.  Recommendations: Reinforced limiting salt intake.  Copy sent to Allena Katz, NP for review and recommendations if needed.   Follow-up plan: ICM clinic phone appointment not scheduled at this time.   91 day device clinic remote transmission 07/24/2022.    EP/Cardiology Office Visits: 07/23/2022 with Dr. Aundra Dubin.    Copy of ICM check sent to Dr. Quentin Ore.   3 month ICM trend: 06/06/2022.    12-14 Month ICM trend:     Rosalene Billings, RN 06/06/2022 1:37 PM

## 2022-06-06 NOTE — Progress Notes (Signed)
Attempted call to unable to reach.  Left message to return call. Transmission reviewed.   Allena Katz, NP recommended to check monthly fluid levels.  Will schedule recheck 7/17.  Left phone number with patient to return call.

## 2022-06-16 ENCOUNTER — Ambulatory Visit: Payer: Medicare HMO

## 2022-06-16 ENCOUNTER — Other Ambulatory Visit: Payer: Self-pay | Admitting: Internal Medicine

## 2022-06-16 DIAGNOSIS — Z95810 Presence of automatic (implantable) cardiac defibrillator: Secondary | ICD-10-CM

## 2022-06-16 DIAGNOSIS — I5022 Chronic systolic (congestive) heart failure: Secondary | ICD-10-CM

## 2022-06-16 MED ORDER — METOLAZONE 2.5 MG PO TABS
2.5000 mg | ORAL_TABLET | Freq: Every day | ORAL | 0 refills | Status: DC
Start: 2022-06-16 — End: 2022-07-23

## 2022-06-16 NOTE — Progress Notes (Signed)
Received: Today [place], Maricela Bo, FNP  Guillermo Difrancesco Panda, RN He can take metolazone 2.5 mg + 40 KCL with one of his torsemide doses.  He will need repeat BMET in 1 week please.   Thanks!
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
## 2022-06-16 NOTE — Progress Notes (Signed)
EPIC Encounter for ICM Monitoring  Patient Name: [name] is a 56 y.o. male Date: 06/16/2022 Primary Care Physican: [name], MD Primary Cardiologist: Aundra Dubin Electrophysiologist: Marisa Sprinkles Pacing: 98% 06/06/2022 Weight: 360 lbs    06/16/2022 Weight: 360 lbs                                                  ICM recheck for HF clinic.  Heart Failure questions reviewed.  Pt reports sluggishness he was feeling last week has resolved.  Some days he has better urine output than other days.  He has been drinking more than 64 oz fluid daily and is not strict with limiting salt intake.  He does eat restaurant foods which he is aware the food is high in salt.      CorVue thoracic impedance suggesting possible fluid accumulation starting 7/12.   Prescribed:  Torsemide 20 mg Take 2 tablets (40 mg total) by mouth daily. Potassium 20 mEq take 1 tablet(s) (20 mEq total) by mouth.   Labs: 05/30/2022 Creatinine 1.46, BUN 17, Potassium 3.5, Sodium 139, GFR 57 A complete set of results can be found in Results Review.   Recommendations: Reinforced limiting salt and intake.  Copy sent to Allena Katz, NP for review and recommendations if needed.    Follow-up plan: ICM clinic phone appointment 06/30/2022 to recheck fluid levels.   91 day device clinic remote transmission 07/24/2022.     EP/Cardiology Office Visits: 07/23/2022 with Dr. Aundra Dubin.     Copy of ICM check sent to Dr. Quentin Ore.   3 month ICM trend: 06/16/2022.    12-14 Month ICM trend:     Rosalene Billings, RN 06/16/2022 1:48 PM

## 2022-06-16 NOTE — Progress Notes (Signed)
Spoke with patient and advised Allena Katz, NP recommended to take 1 time dose of Metolazone 2.5 mg with 40 mEq potassium 30 minutes prior to taking Torsemide for maximum effect.  Advised to have labs drawn in 1 week.  He has a script for BMP written end of June and will [facility] it to [facility] to be [facility].  He verbalized understanding of plan and provided number to call call back with questions.  He will send remote transmission to recheck fluid levels 7/21.

## 2022-06-18 ENCOUNTER — Encounter: Payer: Self-pay | Admitting: Psychiatry

## 2022-06-18 ENCOUNTER — Telehealth (INDEPENDENT_AMBULATORY_CARE_PROVIDER_SITE_OTHER): Payer: Medicare HMO | Admitting: Psychiatry

## 2022-06-18 DIAGNOSIS — F410 Panic disorder [episodic paroxysmal anxiety] without agoraphobia: Secondary | ICD-10-CM | POA: Diagnosis not present

## 2022-06-18 DIAGNOSIS — F4310 Post-traumatic stress disorder, unspecified: Secondary | ICD-10-CM

## 2022-06-18 DIAGNOSIS — R69 Illness, unspecified: Secondary | ICD-10-CM | POA: Diagnosis not present

## 2022-06-18 DIAGNOSIS — F3132 Bipolar disorder, current episode depressed, moderate: Secondary | ICD-10-CM | POA: Diagnosis not present

## 2022-06-18 MED ORDER — CLONAZEPAM 1 MG PO TABS: 1.0000 mg | ORAL_TABLET | Freq: Two times a day (BID) | ORAL | 0 refills | Status: DC | PRN

## 2022-06-18 NOTE — Patient Instructions (Addendum)
Restart Abilify 15 mg daily  Hold sertraline, bupropion  Continue clonazepam 1 mg twice a day as needed for anxiety  Next appointment- 8/18 at 10:30

## 2022-06-20 ENCOUNTER — Ambulatory Visit (INDEPENDENT_AMBULATORY_CARE_PROVIDER_SITE_OTHER): Payer: Medicare HMO

## 2022-06-20 ENCOUNTER — Telehealth: Payer: Self-pay

## 2022-06-20 DIAGNOSIS — Z95810 Presence of automatic (implantable) cardiac defibrillator: Secondary | ICD-10-CM

## 2022-06-20 DIAGNOSIS — I5022 Chronic systolic (congestive) heart failure: Secondary | ICD-10-CM

## 2022-06-20 NOTE — Telephone Encounter (Signed)
Spoke with patient.  Advised to send manual remote transmission to recheck fluid levels as discussed earlier this week.  He has not taken the Metolazone as prescribed by Allena Katz, NP at HF clinic due to he did not have a way to get to the pharmacy.  He will send remote transmission today and will pick up Metolazone to take this weekend.

## 2022-06-20 NOTE — Progress Notes (Signed)
EPIC Encounter for ICM Monitoring  Patient Name: [name] is a 56 y.o. male Date: 06/20/2022 Primary Care Physican: [name], MD Primary Cardiologist: Aundra Dubin Electrophysiologist: Marisa Sprinkles Pacing: 98% 06/06/2022 Weight: 360 lbs    06/16/2022 Weight: 360 lbs     06/20/2022 Weight: 356 lbs                                             ICM recheck for HF clinic.  Heart Failure questions reviewed.  Pt reports he felt like fluid accumulation unresolved this week WITHOUT taking Metolazone + Potassium.  He stated the reason he did not take Metolazone, as prescribed by Allena Katz, NP at clinic, was because he did not have transportation to the pharmacy.   CorVue thoracic impedance suggesting fluid levels returned to normal WITHOUT taking Metolazone as ordered.    Prescribed:  Torsemide 20 mg Take 2 tablets (40 mg total) by mouth daily. Potassium 20 mEq take 1 tablet(s) (20 mEq total) by mouth.   Labs: 05/30/2022 Creatinine 1.46, BUN 17, Potassium 3.5, Sodium 139, GFR 57 A complete set of results can be found in Results Review.   Recommendations:  Advised since fluid levels returned to normal, do not take the one time dose of Metolazone as ordered 7/17.  But did advise DO NOT take Metolazone in the future unless directed by HF clinic to do so.  Advised to limit salt and fluid intake    Follow-up plan: ICM clinic phone appointment 07/07/2022.   91 day device clinic remote transmission 07/24/2022.     EP/Cardiology Office Visits: 07/23/2022 with Dr. Aundra Dubin.     Copy of ICM check sent to Dr. Quentin Ore and Allena Katz, NP to provide update.     3 month ICM trend: 06/20/2022.    12-14 Month ICM trend:     Rosalene Billings, RN 06/20/2022 1:42 PM

## 2022-06-29 DIAGNOSIS — G4733 Obstructive sleep apnea (adult) (pediatric): Secondary | ICD-10-CM | POA: Diagnosis not present

## 2022-06-30 ENCOUNTER — Telehealth: Payer: Self-pay | Admitting: Internal Medicine

## 2022-06-30 NOTE — Telephone Encounter (Signed)
LVM for pt to rtn my call to schedule AWV with [facility] call back # [phone] 

## 2022-07-02 ENCOUNTER — Other Ambulatory Visit: Payer: Self-pay | Admitting: Internal Medicine

## 2022-07-02 ENCOUNTER — Other Ambulatory Visit: Payer: Self-pay | Admitting: Psychiatry

## 2022-07-02 DIAGNOSIS — I5042 Chronic combined systolic (congestive) and diastolic (congestive) heart failure: Secondary | ICD-10-CM

## 2022-07-02 DIAGNOSIS — E118 Type 2 diabetes mellitus with unspecified complications: Secondary | ICD-10-CM

## 2022-07-02 DIAGNOSIS — F3132 Bipolar disorder, current episode depressed, moderate: Secondary | ICD-10-CM

## 2022-07-02 DIAGNOSIS — F4310 Post-traumatic stress disorder, unspecified: Secondary | ICD-10-CM

## 2022-07-03 DIAGNOSIS — H5010 Unspecified exotropia: Secondary | ICD-10-CM | POA: Diagnosis not present

## 2022-07-03 DIAGNOSIS — E119 Type 2 diabetes mellitus without complications: Secondary | ICD-10-CM | POA: Diagnosis not present

## 2022-07-03 DIAGNOSIS — H524 Presbyopia: Secondary | ICD-10-CM | POA: Diagnosis not present

## 2022-07-03 DIAGNOSIS — H25813 Combined forms of age-related cataract, bilateral: Secondary | ICD-10-CM | POA: Diagnosis not present

## 2022-07-03 LAB — HM DIABETES EYE EXAM

## 2022-07-07 ENCOUNTER — Ambulatory Visit (INDEPENDENT_AMBULATORY_CARE_PROVIDER_SITE_OTHER): Payer: Medicare HMO

## 2022-07-07 DIAGNOSIS — I5022 Chronic systolic (congestive) heart failure: Secondary | ICD-10-CM

## 2022-07-07 DIAGNOSIS — Z95810 Presence of automatic (implantable) cardiac defibrillator: Secondary | ICD-10-CM

## 2022-07-08 NOTE — Progress Notes (Signed)
EPIC Encounter for ICM Monitoring  Patient Name: [name] is a 56 y.o. male Date: 07/08/2022 Primary Care Physican: [name], MD Primary Cardiologist: Aundra Dubin Electrophysiologist: Marisa Sprinkles Pacing: 98% 06/06/2022 Weight: 360 lbs    06/16/2022 Weight: 360 lbs     06/20/2022 Weight: 356 lbs                                             Spoke with patient and heart failure questions reviewed.  Pt asymptomatic for fluid accumulation.  Reports feeling well at this time and voices no complaints.    CorVue thoracic impedance normal since 7/19.    Prescribed:  Torsemide 20 mg Take 2 tablets (40 mg total) by mouth daily. Potassium 20 mEq take 1 tablet(s) (20 mEq total) by mouth.   Labs: 05/30/2022 Creatinine 1.46, BUN 17, Potassium 3.5, Sodium 139, GFR 57 A complete set of results can be found in Results Review.   Recommendations:  No changes and encouraged to call if experiencing any fluid symptoms.   Follow-up plan: ICM clinic phone appointment 08/11/2022.   91 day device clinic remote transmission 07/24/2022.     EP/Cardiology Office Visits: 07/23/2022 with Dr. Aundra Dubin.     Copy of ICM check sent to Dr. Quentin Ore.   3 month ICM trend: 07/07/2022.    12-14 Month ICM trend:     Rosalene Billings, RN 07/08/2022 3:36 PM
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
## 2022-07-17 ENCOUNTER — Other Ambulatory Visit (HOSPITAL_COMMUNITY): Payer: Self-pay | Admitting: Family Medicine

## 2022-07-17 DIAGNOSIS — I10 Essential (primary) hypertension: Secondary | ICD-10-CM

## 2022-07-18 ENCOUNTER — Telehealth (INDEPENDENT_AMBULATORY_CARE_PROVIDER_SITE_OTHER): Payer: Medicare HMO | Admitting: Psychiatry

## 2022-07-18 ENCOUNTER — Encounter: Payer: Self-pay | Admitting: Psychiatry

## 2022-07-18 DIAGNOSIS — F3132 Bipolar disorder, current episode depressed, moderate: Secondary | ICD-10-CM

## 2022-07-18 DIAGNOSIS — F410 Panic disorder [episodic paroxysmal anxiety] without agoraphobia: Secondary | ICD-10-CM | POA: Diagnosis not present

## 2022-07-18 DIAGNOSIS — R69 Illness, unspecified: Secondary | ICD-10-CM | POA: Diagnosis not present

## 2022-07-18 DIAGNOSIS — F4310 Post-traumatic stress disorder, unspecified: Secondary | ICD-10-CM | POA: Diagnosis not present

## 2022-07-18 MED ORDER — ARIPIPRAZOLE 15 MG PO TABS: 15.0000 mg | ORAL_TABLET | Freq: Every day | ORAL | 1 refills | Status: DC

## 2022-07-18 MED ORDER — CLONAZEPAM 1 MG PO TABS: 1.0000 mg | ORAL_TABLET | Freq: Two times a day (BID) | ORAL | 1 refills | Status: DC | PRN

## 2022-07-18 NOTE — Patient Instructions (Signed)
Continue Abilify 15 mg daily  Continue clonazepam 1 mg twice a day as needed for anxiety  Next appointment- 10/11 at 10:30

## 2022-07-20 ENCOUNTER — Other Ambulatory Visit: Payer: Self-pay | Admitting: Psychiatry

## 2022-07-20 DIAGNOSIS — F3132 Bipolar disorder, current episode depressed, moderate: Secondary | ICD-10-CM

## 2022-07-23 ENCOUNTER — Ambulatory Visit (HOSPITAL_BASED_OUTPATIENT_CLINIC_OR_DEPARTMENT_OTHER)
Admission: RE | Admit: 2022-07-23 | Discharge: 2022-07-23 | Disposition: A | Discharge status: Home / Self Care | Payer: Medicare HMO | Source: Ambulatory Visit | Attending: Cardiology | Admitting: Cardiology

## 2022-07-23 ENCOUNTER — Ambulatory Visit (HOSPITAL_COMMUNITY)
Admission: RE | Admit: 2022-07-23 | Discharge: 2022-07-23 | Disposition: A | Discharge status: Home / Self Care | Payer: Medicare HMO | Source: Ambulatory Visit | Attending: Cardiology | Admitting: Cardiology

## 2022-07-23 ENCOUNTER — Encounter (HOSPITAL_COMMUNITY): Payer: Self-pay | Admitting: Cardiology

## 2022-07-23 VITALS — Wt 362.4 lb

## 2022-07-23 DIAGNOSIS — F419 Anxiety disorder, unspecified: Secondary | ICD-10-CM | POA: Diagnosis not present

## 2022-07-23 DIAGNOSIS — I428 Other cardiomyopathies: Secondary | ICD-10-CM | POA: Insufficient documentation

## 2022-07-23 DIAGNOSIS — Z7901 Long term (current) use of anticoagulants: Secondary | ICD-10-CM | POA: Diagnosis not present

## 2022-07-23 DIAGNOSIS — I5042 Chronic combined systolic (congestive) and diastolic (congestive) heart failure: Secondary | ICD-10-CM

## 2022-07-23 DIAGNOSIS — I110 Hypertensive heart disease with heart failure: Secondary | ICD-10-CM | POA: Insufficient documentation

## 2022-07-23 DIAGNOSIS — E785 Hyperlipidemia, unspecified: Secondary | ICD-10-CM | POA: Diagnosis not present

## 2022-07-23 DIAGNOSIS — I480 Paroxysmal atrial fibrillation: Secondary | ICD-10-CM | POA: Insufficient documentation

## 2022-07-23 DIAGNOSIS — Z86711 Personal history of pulmonary embolism: Secondary | ICD-10-CM | POA: Insufficient documentation

## 2022-07-23 DIAGNOSIS — Z79899 Other long term (current) drug therapy: Secondary | ICD-10-CM | POA: Diagnosis not present

## 2022-07-23 DIAGNOSIS — I472 Ventricular tachycardia, unspecified: Secondary | ICD-10-CM | POA: Diagnosis not present

## 2022-07-23 DIAGNOSIS — Z7984 Long term (current) use of oral hypoglycemic drugs: Secondary | ICD-10-CM | POA: Diagnosis not present

## 2022-07-23 DIAGNOSIS — R002 Palpitations: Secondary | ICD-10-CM | POA: Diagnosis not present

## 2022-07-23 DIAGNOSIS — G4733 Obstructive sleep apnea (adult) (pediatric): Secondary | ICD-10-CM | POA: Insufficient documentation

## 2022-07-23 DIAGNOSIS — Z01818 Encounter for other preprocedural examination: Secondary | ICD-10-CM | POA: Insufficient documentation

## 2022-07-23 DIAGNOSIS — R69 Illness, unspecified: Secondary | ICD-10-CM | POA: Diagnosis not present

## 2022-07-23 LAB — BRAIN NATRIURETIC PEPTIDE: B Natriuretic Peptide: 285.7 pg/mL — ABNORMAL HIGH (ref 0.0–100.0)

## 2022-07-23 LAB — BASIC METABOLIC PANEL
Anion gap: 9 (ref 5–15)
BUN: 15 mg/dL (ref 6–20)
CO2: 25 mmol/L (ref 22–32)
Calcium: 8.7 mg/dL — ABNORMAL LOW (ref 8.9–10.3)
Chloride: 107 mmol/L (ref 98–111)
Creatinine, Ser: 1.46 mg/dL — ABNORMAL HIGH (ref 0.61–1.24)
GFR, Estimated: 57 mL/min — ABNORMAL LOW (ref >60)
Glucose, Bld: 96 mg/dL (ref 70–99)
Potassium: 3 mmol/L — ABNORMAL LOW (ref 3.5–5.1)
Sodium: 141 mmol/L (ref 135–145)

## 2022-07-23 LAB — ECHOCARDIOGRAM COMPLETE
AR max vel: 3.6 cm2
AV Area VTI: 3.34 cm2
AV Area mean vel: 3.42 cm2
AV Mean grad: 2 mmHg
AV Peak grad: 3.4 mmHg
Ao pk vel: 0.93 m/s
Area-P 1/2: 4.39 cm2
S' Lateral: 7.9 cm

## 2022-07-23 MED ORDER — LOSARTAN POTASSIUM 25 MG PO TABS
12.5000 mg | ORAL_TABLET | Freq: Every day | ORAL | 3 refills | Status: DC
Start: 2022-07-23 — End: 2022-07-23

## 2022-07-23 MED ORDER — ATORVASTATIN CALCIUM 40 MG PO TABS: 40.0000 mg | ORAL_TABLET | Freq: Every day | ORAL | 3 refills | Status: DC

## 2022-07-23 MED ORDER — POTASSIUM CHLORIDE CRYS ER 20 MEQ PO TBCR: 20.0000 meq | EXTENDED_RELEASE_TABLET | Freq: Every day | ORAL | 3 refills | Status: DC

## 2022-07-23 MED ORDER — PERFLUTREN LIPID MICROSPHERE
1.0000 mL | INTRAVENOUS | Status: DC | PRN
  Administered 2022-07-23: 3 mL via INTRAVENOUS

## 2022-07-23 MED ORDER — LOSARTAN POTASSIUM 25 MG PO TABS
12.5000 mg | ORAL_TABLET | Freq: Two times a day (BID) | ORAL | 3 refills | Status: DC
Start: 2022-07-23 — End: 2023-01-09

## 2022-07-23 MED ORDER — TORSEMIDE 20 MG PO TABS: ORAL_TABLET | ORAL | 3 refills | Status: DC

## 2022-07-23 NOTE — Progress Notes (Signed)
Advanced Heart Failure Clinic Note   Primary Care:Dr. Scarlette Calico Primary Cardiologist: Dr. Aundra Dubin   HPI: [name] is a 56 y.o. male with a past medical history of NICM, EF 25-30% in March 2018, felt to be related to prior ETOH abuse. He also has a history of PE 03/2014 completed a years course of Xarelto, morbid obesity, OSA, and tobacco abuse.    He was admitted 11/12/15-11/14/15 with acute on chronic systolic CHF and palpitations. He wore a 30 day event monitor at discharge as he had frequent PVC's and questionable Afib on telemetry. Also with some NSVT, so he was started on Amiodaone, but at follow up had not started taking it. He had previously refused ICD and was seen inpatient by Dr. Rayann Heman who felt that his morbid obesity was a prohibitive factor.    He was seen in the clinic in April 2018. He had started drinking ETOH again. Volume status was stable, he is not an Entresto candidate due to angioedema with lisinopril. Weight was 411 pounds.    Admitted 04/12/17 with SOB, chest pain. D- dimer was 1.16, chest CT without central obstructing PE, however more peripheral and subsegmental pulmonary artery branches were not confidently evaluated due to his body habitus. He was started on a heparin gtt for presumed PE, however VQ scan showed no PE. Troponin was elevated, peaked at 3.37. LHC showed no CAD. Echo showed an EF of 15%, grade 2 DD, no pericarditis. He was diuresed with IV lasix, and started on torsemide '20mg'$  at discharge. Discharge weight was 405 pounds.   Admitted 5/22 through 04/24/2018 with abdominal pain, nausea, and vomiting. 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 Did not require surgical intervention. He will have follow up with GI.   Echo in 2/21 with EF 20-25%, severe LV dilation.   Presented 05/09/21 w/ SOB 2/2 acute CHF w ? PNA.  CTA showed multifocal ?PNA, no PE. Also in Afib w/ RVR in 150s on admit. Initially required bipap, developed hemoptysis and intubated. Treated w/ IV Lasix. Echo  05/10/21: LVEF < 20%. RV moderately reduced. Improved and was extubated 6/13. Went into Afib w/ RVR. Refused to wear BiPAP. Developed severe agitation/hypoxia and re-intubated.  Went into VT>>VF arrest overnight ACLS>>ROSC after 32mn. Developed refractory AFib again next morning requiring emergent cardioversion 05/16/21. He was transitioned to PO amiodarone. HR remained in 40-50's, metoprolol and sildenafil were stopped. Discharge weight 367 lbs.  St Jude CRT-D device placed in 2/23.  Echo was done today and reviewed, EF <20%, mild MR, RV normal.   He returns today for followup of CHF and atrial fibrillation.  Weight is up 3 lbs.  Last week, he had a day when he felt lightheaded.  He has not taken losartan since that day.  He was doing ok prior with no particular lightheadedness.  He has not been taking KCl.  He tries to walk a mile around his apartment complex on most days.  +Bendopnea.  No orthopnea/PND.  No chest pain.  No palpitations.  [place] Using Bipap nightly.   ECG (personally reviewed): A-BiV pacing  Labs (5/13): K 4.1, creatinine 1.05 Labs (1/14): K 3.8, creatinine 1.16, BNP 54 Labs (2/14): K 3.7, creatinine 1.11, BNP 28 Labs (2/16): K 4.1, creatinine 1.03, LDL 96, HCT 40 Labs (3/16): K 3.7, K 1.13, BNP 367 Labs (8/16): BNP 43, K 3.5, creatinine 1.01 Labs (08/13/15): K 3.7, creatinine 1.18, HCT 41.3 Labs (12/16): K 3.7, creatinine 1.14, TGs 495,  digoxin < 0.2 Labs (2/17): K 3.8, creatinine 0.99, LDL 107, TGs 222 Labs (5/17): K 4, creatinine 1.47 Labs (2/18): K 3.9, creatinine 1.06, hgb 15.1, TGs 163, LDL 89, HDL 28, TSH normal  Labs (5/18): K 3.8, creatinine 1.12.  Labs (12/30/2017): K 3.8 Creatinine 1.25  Labs (01/28/2018): K 3.8 Creatinine 1.08 Labs 03/19/2018: K 4.1 Creatinine 1/15 BNP 212  Labs 04/29/3018: Creatinine 1.1  Labs (10/19): LDL 166, K 3.8, creatinine 1.01, AST 102 => 25, ALT 125 => 37, alkaline phosphatase 233 Labs (11/20):  LDL 106, TGs 232 Labs (2/21): K 3.3, creatinine 1.3 Labs (4/21): LDL 114, TGs 466, K 3.6, creatinine 1.22 Labs (12/21): K 3.5, creatinine 1.08, LDL 164, TGs 133 Labs (722): K 3.8, creatinine 1.4 Labs (8/22): K 3.8, creatinine 1.23 Labs (9/22): K 3.6, creatinine 1.26 Labs (2/23): LFTs normal, TSH normal, K 4, creatinine 1.3  PMH: 1. Nonischemic cardiomyopathy: Prior cath with no significant CAD.  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  Prior echoes with EF as low as 25%.  Echo (9/13) with EF 35-40%, moderate to severe LV dilation, diffuse hypokinesis, mild MR. Echo (5/15) with EF 30-35%, moderate to severe LAE, normal RV size and systolic function.  Angioedema with ACEI, headaches with hydralazine/nitrates. Echo (3/16) with EF 25-30%, severe LV dilation, normal RV size and systolic function.  [facility] 08/13/15 showed no significant coronary disease; RA mean 6, PA 33/11 mean 23, PCWP mean 13, Fick CO/CI 4.75 /1.68 (difficult study, radial artery spasm, if needs future cath would use groin).  Echo (9/16) showed EF 20-25%.   - Echo (3/18): EF 25-30%, moderate LAE - Echo 4/18 EF 15% - Echo 7/19 EF 20-25% - Echo 2/21 with EF 20-25%, severe LV dilation.  - Echo 6/22 with EF < 20%, normal RV. - St Jude CRT-D 2/23.  - Echo (8/23): EF <20%, mild MR, RV normal.  2. HTN: angioedema with ACEI.  3. OSA: on Bipap 4. Morbid obesity 5. Paroxysmal atrial fibrillation: Urgent DCCV 6/22. 6. Smoker.  7. Anxiety/panic attacks 8. PE: 5/15, diagnosed by V/Q scan. CTA chest 8/16 negative for PE.  9. NSVT, PVCs: 30 day monitor (12/16) with PVCs, PACs, no atrial fibrillation.  - Zio patch (9/19): few short NSVT runs, no atrial fibrillation, 1.1% PVCs 10. Hematuria: Apparently had negative workup by urology.  11. ABIs (6/16) were normal 12. 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  13. Gout 14. Low back pain.  18. VT/VF 6/22   SH: Married, lives in [place], drinks ETOH  occasionally, no drugs, no smoking.  Has son and daughter.     FH: No premature CAD.    Review of systems complete and found to be negative unless listed in HPI.   Current Outpatient Medications  Medication Sig Dispense Refill   albuterol (VENTOLIN HFA) 108 (90 Base) MCG/ACT inhaler INHALE 1-2 PUFFS INTO THE LUNGS EVERY 4 (FOUR) HOURS AS NEEDED FOR SHORTNESS OF BREATH OR WHEEZING. 8.5 each 1   amiodarone (PACERONE) 200 MG tablet TAKE 1 TABLET (200 MG TOTAL) BY MOUTH DAILY. PATIENT TAKING ONCE DAILY 90 tablet 3   ARIPiprazole (ABILIFY) 15 MG tablet Take 1 tablet (15 mg total) by mouth daily. 30 tablet 1   carvedilol (COREG) 3.125 MG tablet Take 1 tablet (3.125 mg total) by mouth 2 (two) times daily. 60 tablet 6   clonazePAM (KLONOPIN) 1 MG tablet Take 1 tablet (1 mg total) by mouth 2 (two) times daily as needed for anxiety.  60 tablet 1   FARXIGA 10 MG TABS tablet TAKE 1 TABLET BY MOUTH EVERY DAY 90 tablet 1   KLOR-CON M20 20 MEQ tablet TAKE 2 TABLETS BY MOUTH DAILY 180 tablet 3   pantoprazole (PROTONIX) 40 MG tablet Take 1 tablet (40 mg total) by mouth daily. 90 tablet 1   PRESCRIPTION MEDICATION Inhale into the lungs See admin instructions. Bipap, pressure 16/12 with 2L of O2 - use whenever sleeping     rivaroxaban (XARELTO) 20 MG TABS tablet Take 1 tablet (20 mg total) by mouth daily with supper. 90 tablet 1   sildenafil (VIAGRA) 50 MG tablet Take 1 tablet by mouth daily as needed for erectile dysfunction (begin with one tablet --may take 2 if necessary). 15 tablet 5   spironolactone (ALDACTONE) 25 MG tablet TAKE 1 TABLET (25 MG TOTAL) BY MOUTH DAILY. 90 tablet 1   atorvastatin (LIPITOR) 40 MG tablet Take 1 tablet (40 mg total) by mouth daily. 90 tablet 3   losartan (COZAAR) 25 MG tablet Take 0.5 tablets (12.5 mg total) by mouth 2 (two) times daily. 90 tablet 3   potassium chloride SA (KLOR-CON M) 20 MEQ tablet Take 1 tablet (20 mEq total) by mouth daily. 90 tablet 3   torsemide (DEMADEX) 20  MG tablet Take '60mg'$  daily,alternating with 40 mg daily 180 tablet 3   No current facility-administered medications for this encounter.   Allergies  Allergen Reactions   Ace Inhibitors Anaphylaxis and Swelling    Angioedema   Bidil [Isosorb Dinitrate-Hydralazine] Other (See Comments)    headache   Digoxin And Related     Unspecified "side effects"   Buspirone Other (See Comments)    dizziness   Social History   Socioeconomic History   Marital status: Married    Spouse name: Not on file   Number of children: 3   Years of education: Not on file   Highest education level: Not on file  Occupational History   Occupation: DISABLED  Tobacco Use   Smoking status: Former    Packs/day: 0.50    Years: 30.00    Total pack years: 15.00    Types: Cigarettes    Quit date: 05/15/2020    Years since quitting: 2.1   Smokeless tobacco: Never   Tobacco comments:    vaping - nicotine-free products  Vaping Use   Vaping Use: Never used  Substance and Sexual Activity   Alcohol use: Not Currently    Alcohol/week: 0.0 standard drinks of alcohol   Drug use: No   Sexual activity: Not Currently  Other Topics Concern   Not on file  Social History Narrative   He smokes about a pack per day and he has been smoking since he was 56 years of age.  He drinks alcohol occasionally, but he denies any illicit drug abuse.  He is presently on disability.    Lives with wife in a 2 story home.  Has 2 children.   Previously worked in [employer], last worked in 1998.   Highest level of education:  11th grade           Social Determinants of Health   Financial Resource Strain: Low Risk  (01/15/2021)   Overall Financial Resource Strain (CARDIA)    Difficulty of Paying Living Expenses: Not hard at all  Food Insecurity: No Food Insecurity (01/15/2021)   Hunger Vital Sign    Worried About Running Out of Food in the Last Year: Never true    Ran Out  of Food in the Last Year: Never true  Transportation Needs: No  Transportation Needs (01/15/2021)   PRAPARE - Hydrologist (Medical): No    Lack of Transportation (Non-Medical): No  Physical Activity: Sufficiently Active (01/15/2021)   Exercise Vital Sign    Days of Exercise per Week: 5 days    Minutes of Exercise per Session: 30 min  Stress: No Stress Concern Present (01/15/2021)   [place]    Feeling of Stress : Not at all  Social Connections: Moderately Isolated (01/15/2021)   Social Connection and Isolation Panel [NHANES]    Frequency of Communication with Friends and Family: More than three times a week    Frequency of Social Gatherings with Friends and Family: Never    Attends Religious Services: Never    Marine scientist or Organizations: No    Attends Music therapist: Never    Marital Status: Married  Human resources officer Violence: Not on file   Family History  Problem Relation Age of Onset   Cancer Mother        brain tumor   Hypertension Mother    Diabetes Father        Deceased, 7   Heart disease Maternal Grandmother    Hypertension Other        Family History   Stroke Other        Family History   Diabetes Other        Family History   Diabetes Daughter    Wt (!) 164.4 kg (362 lb 6.4 oz)   BMI 53.52 kg/m   Wt Readings from Last 3 Encounters:  07/23/22 (!) 164.4 kg (362 lb 6.4 oz)  05/30/22 (!) 163.2 kg (359 lb 12.8 oz)  04/11/22 (!) 162.6 kg (358 lb 6.4 oz)   PHYSICAL EXAM: General: NAD, obese.  Neck: Thick, JVP 8 cm, no thyromegaly or thyroid nodule.  Lungs: Clear to auscultation bilaterally with normal respiratory effort. CV: Nondisplaced PMI.  Heart regular S1/S2, no S3/S4, no murmur.  No peripheral edema.  No carotid bruit.  Normal pedal pulses.  Abdomen: Soft, nontender, no hepatosplenomegaly, no distention.  Skin: Intact without lesions or rashes.  Neurologic: Alert and oriented x 3.  Psych: Normal  affect. Extremities: No clubbing or cyanosis.  HEENT: Normal.   ASSESSMENT & PLAN: 1. Atrial fibrillation: Paroyxsmal. Emergent DCCV in 6/22. NSR today, no palpitations.  - Continue amiodarone 200 mg daily.  Check LFTs, TSH today.  Needs regular eye exam.  - Continue Xarelto. 2.  Chronic systolic CHF: Nonischemic cardiomyopathy. ?If ETOH has played a role (now drinks rarely). Echo (3/18) with EF 25-30%. Echo 7/19 and in 2/21 with EF 20-25%. Echo 6/22 with EF < 20%, normal RV.  Has St Jude CRT-D device now.  Echo-post CRT today showed EF <20%, mild MR, RV normal.  NYHA class II now, volume status difficult by exam but weight is up and he feels like he is retaining fluid.  - Increase torsemide to 60 daily alternating with 40 daily.  He needs to take KCl 20 daily. BMET today and in 10 days.  - Continue spironolactone 25 mg daily. - Continue Farxiga 10 mg daily. - Restart losartan at 12.5 mg bid.  - Continue Coreg 3.125 mg bid.  - No Entresto, ACEI with h/o angioedema.  - Headaches with Bidil, cannot take.  - He did not tolerate digoxin.  - I will refer for  cardiac rehab.   - I will arrange for CPX.  - I will have him assessed today for baroreceptor activation therapy.  - I am concerned about him long-term, he is too heavy for heart transplant and needs weight loss even to get him to LVAD.  Discussed today.  3. HTN: Controlled.  4. 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  Has St Jude CRT-D device now.  5. OSA: Continue nightly BiPAP. 6. PE: 04/14/2018, diagnosed by V/Q scan. Has been on Xarelto. 7. Anxiety/Panic attacks/depression: Followed by psychiatry. 9. Hyperlipidemia: Continue atorvastatin.   10. Obesity: Insurance will not cover semaglutide/dulaglutide.  - [facility].  - Refer for cardiac rehab.    Followup in 6 wks with APP.   [name] MD 07/23/2022

## 2022-07-23 NOTE — Patient Instructions (Addendum)
Alternate your Torsemide '60mg'$  daily with '40mg'$  daily  Decrease Losartan to 12.'5mg'$  (1/2 Tab) daily.  Take Potassium 1 Tab daily.  Labs done today, your results will be available in MyChart, we will contact you for abnormal readings.  Your physician has recommended that you have a cardiopulmonary stress test (CPX). CPX testing is a non-invasive measurement of heart and lung function. It replaces a traditional treadmill stress test. This type of test provides a tremendous amount of information that relates not only to your present condition but also for future outcomes. This test combines measurements of you ventilation, respiratory gas exchange in the lungs, electrocardiogram (EKG), blood pressure and physical response before, during, and following an exercise protocol.  You have been referred to Cardiac Rehab. They will call you to arrange the appointment.  You have been referred to [facility]. They will call you to arrange the appointment.  Your physician recommends that you schedule a follow-up appointment in: 6 weeks  If you have any questions or concerns before your next appointment please send [place] a message through [place] or call our office at [phone].    TO LEAVE A MESSAGE FOR THE NURSE SELECT OPTION 2, PLEASE LEAVE A MESSAGE INCLUDING: YOUR NAME DATE OF BIRTH CALL BACK NUMBER REASON FOR CALL**this is important as we prioritize the call backs  YOU WILL RECEIVE A CALL BACK THE SAME DAY AS LONG AS YOU CALL BEFORE 4:00 PM  At the [facility], you and your health needs are our priority. As part of our continuing mission to provide you with exceptional heart care, we have created designated Provider Care Teams. These Care Teams include your primary Cardiologist (physician) and Advanced Practice Providers (APPs- Physician Assistants and Nurse Practitioners) who all work together to provide you with the care you need, when you need it.   You  may see any of the following providers on your designated Care Team at your next follow up: Dr Glori Bickers Dr Haynes Kerns, NP Lyda Jester, [place] [facility] [place], [place] Audry Riles, PharmD   Please be sure to bring in all your medications bottles to every appointment.

## 2022-07-23 NOTE — Progress Notes (Signed)
ReDS Vest / Clip - 07/23/22 1200       ReDS Vest / Clip   Station Marker D    Ruler Value 47    ReDS Value Range Low volume    ReDS Actual Value 32

## 2022-07-23 NOTE — Progress Notes (Signed)
  Echocardiogram 2D Echocardiogram has been performed.  [name] 07/23/2022, 12:03 PM

## 2022-07-24 ENCOUNTER — Ambulatory Visit (INDEPENDENT_AMBULATORY_CARE_PROVIDER_SITE_OTHER): Payer: Medicare HMO

## 2022-07-24 DIAGNOSIS — Z95810 Presence of automatic (implantable) cardiac defibrillator: Secondary | ICD-10-CM | POA: Diagnosis not present

## 2022-07-24 LAB — CUP PACEART REMOTE DEVICE CHECK
Battery Remaining Longevity: 47 mo
Battery Remaining Percentage: 88 %
Battery Voltage: 2.96 V
Brady Statistic AP VP Percent: 80 %
Brady Statistic AP VS Percent: 1.9 %
Brady Statistic AS VP Percent: 17 %
Brady Statistic AS VS Percent: 1 %
Brady Statistic RA Percent Paced: 82 %
Date Time Interrogation Session: 20230824020054
HighPow Impedance: 69 Ohm
Implantable Lead Implant Date: 20230213
Implantable Lead Implant Date: 20230213
Implantable Lead Implant Date: 20230213
Implantable Lead Location: 753857
Implantable Lead Location: 753859
Implantable Lead Location: 753860
Implantable Pulse Generator Implant Date: 20230213
Lead Channel Impedance Value: 440 Ohm
Lead Channel Impedance Value: 450 Ohm
Lead Channel Impedance Value: 880 Ohm
Lead Channel Pacing Threshold Amplitude: 0.75 V
Lead Channel Pacing Threshold Amplitude: 1 V
Lead Channel Pacing Threshold Amplitude: 1 V
Lead Channel Pacing Threshold Pulse Width: 0.5 ms
Lead Channel Pacing Threshold Pulse Width: 0.5 ms
Lead Channel Pacing Threshold Pulse Width: 0.5 ms
Lead Channel Sensing Intrinsic Amplitude: 0.8 mV
Lead Channel Sensing Intrinsic Amplitude: 11.8 mV
Lead Channel Setting Pacing Amplitude: 3.5 V
Lead Channel Setting Pacing Amplitude: 3.5 V
Lead Channel Setting Pacing Amplitude: 3.5 V
Lead Channel Setting Pacing Pulse Width: 0.5 ms
Lead Channel Setting Pacing Pulse Width: 0.5 ms
Lead Channel Setting Sensing Sensitivity: 0.5 mV
Pulse Gen Serial Number: 210000080

## 2022-07-28 DIAGNOSIS — G4733 Obstructive sleep apnea (adult) (pediatric): Secondary | ICD-10-CM | POA: Diagnosis not present

## 2022-07-31 ENCOUNTER — Ambulatory Visit (HOSPITAL_COMMUNITY)
Admission: RE | Admit: 2022-07-31 | Discharge: 2022-07-31 | Disposition: A | Discharge status: Home / Self Care | Payer: Medicare HMO | Source: Ambulatory Visit | Attending: Internal Medicine | Admitting: Internal Medicine

## 2022-07-31 DIAGNOSIS — I5042 Chronic combined systolic (congestive) and diastolic (congestive) heart failure: Secondary | ICD-10-CM | POA: Diagnosis not present

## 2022-07-31 LAB — BASIC METABOLIC PANEL
Anion gap: 8 (ref 5–15)
BUN: 21 mg/dL — ABNORMAL HIGH (ref 6–20)
CO2: 27 mmol/L (ref 22–32)
Calcium: 8.9 mg/dL (ref 8.9–10.3)
Chloride: 106 mmol/L (ref 98–111)
Creatinine, Ser: 1.49 mg/dL — ABNORMAL HIGH (ref 0.61–1.24)
GFR, Estimated: 56 mL/min — ABNORMAL LOW (ref >60)
Glucose, Bld: 100 mg/dL — ABNORMAL HIGH (ref 70–99)
Potassium: 3.4 mmol/L — ABNORMAL LOW (ref 3.5–5.1)
Sodium: 141 mmol/L (ref 135–145)

## 2022-08-01 ENCOUNTER — Telehealth (HOSPITAL_COMMUNITY): Payer: Self-pay | Admitting: Surgery

## 2022-08-01 NOTE — Telephone Encounter (Signed)
Patient called to review results and recommendations per provider.  He is aware and was grateful for the call.

## 2022-08-01 NOTE — Telephone Encounter (Signed)
-----   Message from [name], MD sent at 08/01/2022  3:35 PM EDT ----- Increase K in diet.

## 2022-08-11 ENCOUNTER — Ambulatory Visit (INDEPENDENT_AMBULATORY_CARE_PROVIDER_SITE_OTHER): Payer: Medicare HMO

## 2022-08-11 DIAGNOSIS — Z95810 Presence of automatic (implantable) cardiac defibrillator: Secondary | ICD-10-CM | POA: Diagnosis not present

## 2022-08-11 DIAGNOSIS — I5042 Chronic combined systolic (congestive) and diastolic (congestive) heart failure: Secondary | ICD-10-CM

## 2022-08-13 NOTE — Progress Notes (Signed)
EPIC Encounter for ICM Monitoring  Patient Name: [name] is a 56 y.o. male Date: 08/13/2022 Primary Care Physican: [name], MD Primary Cardiologist: Aundra Dubin Electrophysiologist: Marisa Sprinkles Pacing: 98% 06/06/2022 Weight: 360 lbs    06/16/2022 Weight: 360 lbs     06/20/2022 Weight: 356 lbs      08/13/2022 Weight: 372 lbs                                        Spoke with patient and heart failure questions reviewed.  Pt asymptomatic for fluid accumulation.  Reports feeling well at this time and voices no complaints.     CorVue thoracic impedance suggesting normal fluid levels with intermittent days of possible fluid accumulation within last month.    Prescribed:  Torsemide 20 mg Take 3 tablets (60 mg total) by mouth alternating 2 tablets (40 mg total) every other day. Potassium 20 mEq take 1 tablet(s) (20 mEq total) by mouth. Spironolactone 25 mg take 1 tablet daily   Labs: 07/31/2022 Creatinine 1.49, BUN 21, Potassium 3.4, Sodium 141, GFR 56 07/23/2022 Creatinine 1.46, BUN 15, Potassium 3.0, Sodium 141, GFR 57 05/30/2022 Creatinine 1.46, BUN 17, Potassium 3.5, Sodium 139, GFR 57 A complete set of results can be found in Results Review.   Recommendations:  No changes and encouraged to call if experiencing any fluid symptoms.   Follow-up plan: ICM clinic phone appointment 09/15/2022.   91 day device clinic remote transmission 10/28/2022.     EP/Cardiology Office Visits:   No appts or recalls.   6 weeks appt due for HF appointment 10/9 (no recall).   04/22/2022  OV for 91 day follow up post Device implant OV with Dr Quentin Ore was canceled and not rescheduled.  Message sent to EP scheduler 9/13 to contact patient to schedule EP visit.     Copy of ICM check sent to Dr. Quentin Ore.    3 month ICM trend: 08/12/2022.    12-14 Month ICM trend:     Rosalene Billings, RN 08/13/2022 8:10 AM

## 2022-08-19 ENCOUNTER — Telehealth (HOSPITAL_COMMUNITY): Payer: Self-pay

## 2022-08-19 NOTE — Telephone Encounter (Signed)
Called to confirm/remind patient of their appointment at the [facility] on 08/20/22.   Patient reminded to bring all medications and/or complete list.  Confirmed patient has transportation. Gave directions, instructed to utilize [place] parking.  Confirmed appointment prior to ending call.

## 2022-08-19 NOTE — Progress Notes (Signed)
Remote ICD transmission.   

## 2022-08-20 ENCOUNTER — Encounter (HOSPITAL_COMMUNITY): Payer: Self-pay

## 2022-08-20 ENCOUNTER — Ambulatory Visit (HOSPITAL_COMMUNITY): Payer: Medicare HMO

## 2022-08-20 ENCOUNTER — Ambulatory Visit (HOSPITAL_COMMUNITY)
Admission: RE | Admit: 2022-08-20 | Discharge: 2022-08-20 | Disposition: A | Discharge status: Home / Self Care | Payer: Medicare HMO | Source: Ambulatory Visit | Attending: Family Medicine | Admitting: Family Medicine

## 2022-08-20 VITALS — BP 106/74 | HR 73 | Wt 368.8 lb

## 2022-08-20 DIAGNOSIS — Z86711 Personal history of pulmonary embolism: Secondary | ICD-10-CM | POA: Diagnosis not present

## 2022-08-20 DIAGNOSIS — Z720 Tobacco use: Secondary | ICD-10-CM

## 2022-08-20 DIAGNOSIS — F419 Anxiety disorder, unspecified: Secondary | ICD-10-CM | POA: Diagnosis not present

## 2022-08-20 DIAGNOSIS — I5042 Chronic combined systolic (congestive) and diastolic (congestive) heart failure: Secondary | ICD-10-CM | POA: Diagnosis not present

## 2022-08-20 DIAGNOSIS — E785 Hyperlipidemia, unspecified: Secondary | ICD-10-CM

## 2022-08-20 DIAGNOSIS — Z95810 Presence of automatic (implantable) cardiac defibrillator: Secondary | ICD-10-CM | POA: Diagnosis not present

## 2022-08-20 DIAGNOSIS — I5022 Chronic systolic (congestive) heart failure: Secondary | ICD-10-CM | POA: Diagnosis not present

## 2022-08-20 DIAGNOSIS — I472 Ventricular tachycardia, unspecified: Secondary | ICD-10-CM | POA: Diagnosis not present

## 2022-08-20 DIAGNOSIS — F17210 Nicotine dependence, cigarettes, uncomplicated: Secondary | ICD-10-CM | POA: Insufficient documentation

## 2022-08-20 DIAGNOSIS — Z7901 Long term (current) use of anticoagulants: Secondary | ICD-10-CM | POA: Diagnosis not present

## 2022-08-20 DIAGNOSIS — R69 Illness, unspecified: Secondary | ICD-10-CM | POA: Diagnosis not present

## 2022-08-20 DIAGNOSIS — I10 Essential (primary) hypertension: Secondary | ICD-10-CM | POA: Diagnosis not present

## 2022-08-20 DIAGNOSIS — Z6841 Body Mass Index (BMI) 40.0 and over, adult: Secondary | ICD-10-CM | POA: Insufficient documentation

## 2022-08-20 DIAGNOSIS — Z7984 Long term (current) use of oral hypoglycemic drugs: Secondary | ICD-10-CM | POA: Diagnosis not present

## 2022-08-20 DIAGNOSIS — Z8679 Personal history of other diseases of the circulatory system: Secondary | ICD-10-CM

## 2022-08-20 DIAGNOSIS — I428 Other cardiomyopathies: Secondary | ICD-10-CM | POA: Diagnosis not present

## 2022-08-20 DIAGNOSIS — G4733 Obstructive sleep apnea (adult) (pediatric): Secondary | ICD-10-CM | POA: Diagnosis not present

## 2022-08-20 DIAGNOSIS — I110 Hypertensive heart disease with heart failure: Secondary | ICD-10-CM | POA: Insufficient documentation

## 2022-08-20 DIAGNOSIS — R002 Palpitations: Secondary | ICD-10-CM | POA: Diagnosis not present

## 2022-08-20 DIAGNOSIS — I4901 Ventricular fibrillation: Secondary | ICD-10-CM | POA: Diagnosis not present

## 2022-08-20 DIAGNOSIS — R519 Headache, unspecified: Secondary | ICD-10-CM | POA: Insufficient documentation

## 2022-08-20 DIAGNOSIS — I4891 Unspecified atrial fibrillation: Secondary | ICD-10-CM | POA: Diagnosis not present

## 2022-08-20 DIAGNOSIS — Z79899 Other long term (current) drug therapy: Secondary | ICD-10-CM | POA: Insufficient documentation

## 2022-08-20 DIAGNOSIS — I480 Paroxysmal atrial fibrillation: Secondary | ICD-10-CM

## 2022-08-20 LAB — BASIC METABOLIC PANEL
Anion gap: 8 (ref 5–15)
BUN: 17 mg/dL (ref 6–20)
CO2: 25 mmol/L (ref 22–32)
Calcium: 9 mg/dL (ref 8.9–10.3)
Chloride: 105 mmol/L (ref 98–111)
Creatinine, Ser: 1.53 mg/dL — ABNORMAL HIGH (ref 0.61–1.24)
GFR, Estimated: 54 mL/min — ABNORMAL LOW (ref >60)
Glucose, Bld: 110 mg/dL — ABNORMAL HIGH (ref 70–99)
Potassium: 3.4 mmol/L — ABNORMAL LOW (ref 3.5–5.1)
Sodium: 138 mmol/L (ref 135–145)

## 2022-08-20 LAB — BRAIN NATRIURETIC PEPTIDE: B Natriuretic Peptide: 231.1 pg/mL — ABNORMAL HIGH (ref 0.0–100.0)

## 2022-08-20 MED ORDER — ATORVASTATIN CALCIUM 40 MG PO TABS: 40.0000 mg | ORAL_TABLET | Freq: Every day | ORAL | 3 refills | Status: DC

## 2022-08-20 NOTE — Progress Notes (Signed)
Advanced Heart Failure Clinic Note   Primary Care: Dr. Scarlette Calico HF Cardiologist: Dr. Aundra Dubin   HPI: [name] is a 56 y.o. male with a past medical history of NICM, EF 25-30% in March 2018, felt to be related to prior ETOH abuse. He also has a history of PE 03/2014 completed a years course of Xarelto, morbid obesity, OSA, and tobacco abuse.    He was admitted 11/12/15-11/14/15 with acute on chronic systolic CHF and palpitations. He wore a 30 day event monitor at discharge as he had frequent PVC's and questionable Afib on telemetry. Also with some NSVT, so he was started on Amiodaone, but at follow up had not started taking it. He had previously refused ICD and was seen inpatient by Dr. Rayann Heman who felt that his morbid obesity was a prohibitive factor.    He was seen in the clinic in April 2018. He had started drinking ETOH again. Volume status was stable, he is not an Entresto candidate due to angioedema with lisinopril. Weight was 411 pounds.    Admitted 04/12/17 with SOB, chest pain. D- dimer was 1.16, chest CT without central obstructing PE, however more peripheral and subsegmental pulmonary artery branches were not confidently evaluated due to his body habitus. He was started on a heparin gtt for presumed PE, however VQ scan showed no PE. Troponin was elevated, peaked at 3.37. LHC showed no CAD. Echo showed an EF of 15%, grade 2 DD, no pericarditis. He was diuresed with IV lasix, and started on torsemide '20mg'$  at discharge. Discharge weight was 405 pounds.   Admitted 5/22 through 04/24/2018 with abdominal pain, nausea, and vomiting. 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 Did not require surgical intervention. He will have follow up with GI.   Echo in 2/21 with EF 20-25%, severe LV dilation.   Presented 05/09/21 w/ SOB 2/2 acute CHF w ? PNA.  CTA showed multifocal ?PNA, no PE. Also in Afib w/ RVR in 150s on admit. Initially required bipap, developed hemoptysis and intubated. Treated w/ IV Lasix. Echo  05/10/21: LVEF < 20%. RV moderately reduced. Improved and was extubated 6/13. Went into Afib w/ RVR. Refused to wear BiPAP. Developed severe agitation/hypoxia and re-intubated.  Went into VT>>VF arrest overnight ACLS>>ROSC after 74mn. Developed refractory AFib again next morning requiring emergent cardioversion 05/16/21. He was transitioned to PO amiodarone. HR remained in 40-50's, metoprolol and sildenafil were stopped. Discharge weight 367 lbs.  St Jude CRT-D device placed in 2/23.  Echo 8/23 showed EF <20%, mild MR, RV normal.   Today he returns for HF follow up. Overall feeling fatigued, but better on higher dose of torsemide. He had been walking around his neighborhood twice a day without significant dyspnea. He had a dizzy spell recently after a walk and has since stopped. Feet are swelling some. Denies CP, palpitations, edema, or PND/Orthopnea. Appetite ok. No fever or chills. Weight at home 368 pounds. Taking all medications. Drinking a lot of fluids, more than 2L/day. Wearing BiPap. Back smoking 3-4 cigs/day. Drinks light beer on the weekend. Separated from wife and now living in [place]  Device interrogation (personally reviewed from 08/11/22): CoreVue stable  ECG (personally reviewed): none ordered today.  Labs (5/13): K 4.1, creatinine 1.05 Labs (1/14): K 3.8, creatinine 1.16, BNP 54 Labs (2/14): K 3.7, creatinine 1.11, BNP 28 Labs (2/16): K 4.1, creatinine 1.03, LDL 96, HCT 40 Labs (3/16): K 3.7, K 1.13, BNP 367 Labs (8/16): BNP 43, K 3.5, creatinine 1.01 Labs (08/13/15): K 3.7, creatinine 1.18, HCT  41.3 Labs (12/16): K 3.7, creatinine 1.14, TGs 495, digoxin < 0.2 Labs (2/17): K 3.8, creatinine 0.99, LDL 107, TGs 222 Labs (5/17): K 4, creatinine 1.47 Labs (2/18): K 3.9, creatinine 1.06, hgb 15.1, TGs 163, LDL 89, HDL 28, TSH normal  Labs (5/18): K 3.8, creatinine 1.12.  Labs (1/19): K 3.8 Creatinine 1.25  Labs (2/19): K 3.8 Creatinine 1.08 Labs (4/19): K 4.1 creatinine 1/15 BNP  212  Labs (5/19): creatinine 1.1  Labs (10/19): LDL 166, K 3.8, creatinine 1.01, AST 102 => 25, ALT 125 => 37, alkaline phosphatase 233 Labs (11/20): LDL 106, TGs 232 Labs (2/21): K 3.3, creatinine 1.3 Labs (4/21): LDL 114, TGs 466, K 3.6, creatinine 1.22 Labs (12/21): K 3.5, creatinine 1.08, LDL 164, TGs 133 Labs (722): K 3.8, creatinine 1.4 Labs (8/22): K 3.8, creatinine 1.23 Labs (9/22): K 3.6, creatinine 1.26 Labs (2/23): LFTs normal, TSH normal, K 4, creatinine 1.3 Labs (8/23): K 3.4, creatinine 1.49  PMH: 1. Nonischemic cardiomyopathy: Prior cath with no significant CAD.  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  Prior echoes with EF as low as 25%.  Echo (9/13) with EF 35-40%, moderate to severe LV dilation, diffuse hypokinesis, mild MR. Echo (5/15) with EF 30-35%, moderate to severe LAE, normal RV size and systolic function.  Angioedema with ACEI, headaches with hydralazine/nitrates. Echo (3/16) with EF 25-30%, severe LV dilation, normal RV size and systolic function.  [facility] 08/13/15 showed no significant coronary disease; RA mean 6, PA 33/11 mean 23, PCWP mean 13, Fick CO/CI 4.75 /1.68 (difficult study, radial artery spasm, if needs future cath would use groin).  Echo (9/16) showed EF 20-25%.   - Echo (3/18): EF 25-30%, moderate LAE - Echo 4/18 EF 15% - Echo 7/19 EF 20-25% - Echo 2/21 with EF 20-25%, severe LV dilation.  - Echo 6/22 with EF < 20%, normal RV. - St Jude CRT-D 2/23.  - Echo (8/23): EF <20%, mild MR, RV normal.  2. HTN: angioedema with ACEI.  3. OSA: on Bipap 4. Morbid obesity 5. Paroxysmal atrial fibrillation: Urgent DCCV 6/22. 6. Smoker.  7. Anxiety/panic attacks 8. PE: 5/15, diagnosed by V/Q scan. CTA chest 8/16 negative for PE.  9. NSVT, PVCs: 30 day monitor (12/16) with PVCs, PACs, no atrial fibrillation.  - Zio patch (9/19): few short NSVT runs, no atrial fibrillation, 1.1% PVCs 10. Hematuria: Apparently had negative  workup by urology.  11. ABIs (6/16) were normal 12. 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  13. Gout 14. Low back pain.  73. VT/VF 6/22   SH: Married, lives in [place], drinks ETOH occasionally, no drugs, no smoking.  Has son and daughter.     FH: No premature CAD.    Review of systems complete and found to be negative unless listed in HPI.   Current Outpatient Medications  Medication Sig Dispense Refill   albuterol (VENTOLIN HFA) 108 (90 Base) MCG/ACT inhaler INHALE 1-2 PUFFS INTO THE LUNGS EVERY 4 (FOUR) HOURS AS NEEDED FOR SHORTNESS OF BREATH OR WHEEZING. 8.5 each 1   amiodarone (PACERONE) 200 MG tablet TAKE 1 TABLET (200 MG TOTAL) BY MOUTH DAILY. PATIENT TAKING ONCE DAILY 90 tablet 3   ARIPiprazole (ABILIFY) 15 MG tablet Take 1 tablet (15 mg total) by mouth daily. 30 tablet 1   atorvastatin (LIPITOR) 40 MG tablet Take 1 tablet (40 mg total) by mouth daily. 90 tablet 3   carvedilol (COREG) 3.125 MG tablet Take 1 tablet (3.125  mg total) by mouth 2 (two) times daily. 60 tablet 6   clonazePAM (KLONOPIN) 1 MG tablet Take 1 tablet (1 mg total) by mouth 2 (two) times daily as needed for anxiety. 60 tablet 1   FARXIGA 10 MG TABS tablet TAKE 1 TABLET BY MOUTH EVERY DAY 90 tablet 1   losartan (COZAAR) 25 MG tablet Take 0.5 tablets (12.5 mg total) by mouth 2 (two) times daily. 90 tablet 3   pantoprazole (PROTONIX) 40 MG tablet Take 1 tablet (40 mg total) by mouth daily. 90 tablet 1   potassium chloride SA (KLOR-CON M) 20 MEQ tablet Take 1 tablet (20 mEq total) by mouth daily. 90 tablet 3   PRESCRIPTION MEDICATION Inhale into the lungs See admin instructions. Bipap, pressure 16/12 with 2L of O2 - use whenever sleeping     rivaroxaban (XARELTO) 20 MG TABS tablet Take 1 tablet (20 mg total) by mouth daily with supper. 90 tablet 1   sildenafil (VIAGRA) 50 MG tablet Take 1 tablet by mouth daily as needed for erectile dysfunction (begin with one tablet --may take 2 if necessary). 15 tablet 5    spironolactone (ALDACTONE) 25 MG tablet TAKE 1 TABLET (25 MG TOTAL) BY MOUTH DAILY. 90 tablet 1   torsemide (DEMADEX) 20 MG tablet Take '60mg'$  daily,alternating with 40 mg daily 180 tablet 3   No current facility-administered medications for this encounter.   Allergies  Allergen Reactions   Ace Inhibitors Anaphylaxis and Swelling    Angioedema   Bidil [Isosorb Dinitrate-Hydralazine] Other (See Comments)    headache   Digoxin And Related     Unspecified "side effects"   Buspirone Other (See Comments)    dizziness   Social History   Socioeconomic History   Marital status: Married    Spouse name: Not on file   Number of children: 3   Years of education: Not on file   Highest education level: Not on file  Occupational History   Occupation: DISABLED  Tobacco Use   Smoking status: Former    Packs/day: 0.50    Years: 30.00    Total pack years: 15.00    Types: Cigarettes    Quit date: 05/15/2020    Years since quitting: 2.2   Smokeless tobacco: Never   Tobacco comments:    vaping - nicotine-free products  Vaping Use   Vaping Use: Never used  Substance and Sexual Activity   Alcohol use: Not Currently    Alcohol/week: 0.0 standard drinks of alcohol   Drug use: No   Sexual activity: Not Currently  Other Topics Concern   Not on file  Social History Narrative   He smokes about a pack per day and he has been smoking since he was 56 years of age.  He drinks alcohol occasionally, but he denies any illicit drug abuse.  He is presently on disability.    Lives with wife in a 2 story home.  Has 2 children.   Previously worked in [employer], last worked in 1998.   Highest level of education:  11th grade           Social Determinants of Health   Financial Resource Strain: Low Risk  (01/15/2021)   Overall Financial Resource Strain (CARDIA)    Difficulty of Paying Living Expenses: Not hard at all  Food Insecurity: No Food Insecurity (01/15/2021)   Hunger Vital Sign    Worried About  Running Out of Food in the Last Year: Never true    Ran Out  of Food in the Last Year: Never true  Transportation Needs: No Transportation Needs (01/15/2021)   PRAPARE - Hydrologist (Medical): No    Lack of Transportation (Non-Medical): No  Physical Activity: Sufficiently Active (01/15/2021)   Exercise Vital Sign    Days of Exercise per Week: 5 days    Minutes of Exercise per Session: 30 min  Stress: No Stress Concern Present (01/15/2021)   [place]    Feeling of Stress : Not at all  Social Connections: Moderately Isolated (01/15/2021)   Social Connection and Isolation Panel [NHANES]    Frequency of Communication with Friends and Family: More than three times a week    Frequency of Social Gatherings with Friends and Family: Never    Attends Religious Services: Never    Marine scientist or Organizations: No    Attends Music therapist: Never    Marital Status: Married  Human resources officer Violence: Not on file   Family History  Problem Relation Age of Onset   Cancer Mother        brain tumor   Hypertension Mother    Diabetes Father        Deceased, 53   Heart disease Maternal Grandmother    Hypertension Other        Family History   Stroke Other        Family History   Diabetes Other        Family History   Diabetes Daughter    BP 106/74   Pulse 73   Wt (!) 167.3 kg (368 lb 12.8 oz)   SpO2 98%   BMI 54.46 kg/m   Wt Readings from Last 3 Encounters:  08/20/22 (!) 167.3 kg (368 lb 12.8 oz)  07/23/22 (!) 164.4 kg (362 lb 6.4 oz)  05/30/22 (!) 163.2 kg (359 lb 12.8 oz)   PHYSICAL EXAM: General:  NAD. No resp difficulty, walked into clinic, fatigued-appearing. HEENT: Normal Neck: Supple. No JVD. Carotids 2+ bilat; no bruits. No lymphadenopathy or thryomegaly appreciated. Cor: PMI nondisplaced. Regular rate & rhythm. No rubs, gallops or murmurs. Lungs:  Clear Abdomen: Obese, soft, nontender, nondistended. No hepatosplenomegaly. No bruits or masses. Good bowel sounds. Extremities: No cyanosis, clubbing, rash, 1+ BLE edema R>L Neuro: Alert & oriented x 3, cranial nerves grossly intact. Moves all 4 extremities w/o difficulty. Affect pleasant.  ASSESSMENT & PLAN: 1. Atrial fibrillation: Paroyxsmal. Emergent DCCV in 6/22. Regular on exam today, no palpitations.  - Continue amiodarone 200 mg daily.  LFTs and TSH normal 5/23.  Needs regular eye exam.  - Continue Xarelto. 2.  Chronic systolic CHF: Nonischemic cardiomyopathy. ?If ETOH has played a role (now drinks rarely). Echo (3/18) with EF 25-30%. Echo 7/19 and in 2/21 with EF 20-25%. Echo 6/22 with EF < 20%, normal RV.  Has St Jude CRT-D device now.  Echo-post CRT (8/23) showed EF <20%, mild MR, RV normal.  NYHA class II-early III, functional status difficult with general physical inactivity. Volume status difficult by exam, weight up 6 lbs but OptiVol suggests no fluid accumulation.  - Continue torsemide 60 daily alternating with 40 mg daily w/ 20 KCL daily.  BMET/BNP today. - Continue spironolactone 25 mg daily. - Continue Farxiga 10 mg daily. - Continue losartan 12.5 mg bid.  - Continue Coreg 3.125 mg bid.  - No Entresto, ACEI with h/o angioedema.  - Headaches with Bidil, cannot take.  - He did  not tolerate digoxin.  - He has been referred for cardiac rehab.   - CPX scheduled for later today.  - Insurance denied baroreceptor activation therapy and he has CRT.  - I am concerned about him long-term, he is too heavy for heart transplant and needs weight loss even to get him to LVAD.  Discussed today.  3. HTN: Controlled. Continue current medications. 4. 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  Has St Jude CRT-D device now.  5. OSA: Continue nightly BiPAP. 6. PE: 04/14/2018, diagnosed by V/Q scan. Has been on Xarelto. No bleeding issues. 7. Anxiety/Panic attacks/depression: Followed by  psychiatry. 9. Hyperlipidemia: Continue atorvastatin.  Refill atorvastatin today. 10. Obesity: Body mass index is 54.46 kg/m. Insurance will not cover semaglutide/dulaglutide.  - [facility]  - He has been referred for cardiac rehab.   11. Tobacco use: Back smoking 3-4 cigs/day. Encouraged cessation. He has plans to quit.  Followup in 6-8 weeks with Dr. Aundra Dubin, may need earlier depending on CPX results.  [facility] FNP-BC 08/20/2022

## 2022-08-20 NOTE — Patient Instructions (Signed)
It was great to see you today! No medication changes are needed at this time. -a refill for Lipitor was sent to your pharmacy  Labs today We will only contact you if something comes back abnormal or we need to make some changes. Otherwise no news is good news!  Your physician recommends that you schedule a follow-up appointment in: 6-8 weeks with [name]   Do the following things EVERYDAY: Weigh yourself in the morning before breakfast. Write it down and keep it in a log. Take your medicines as prescribed Eat low salt foods--Limit salt (sodium) to 2000 mg per day.  Stay as active as you can everyday Limit all fluids for the day to less than 2 liters   At the [facility], you and your health needs are our priority. As part of our continuing mission to provide you with exceptional heart care, we have created designated Provider Care Teams. These Care Teams include your primary Cardiologist (physician) and Advanced Practice Providers (APPs- Physician Assistants and Nurse Practitioners) who all work together to provide you with the care you need, when you need it.   You may see any of the following providers on your designated Care Team at your next follow up: Dr Glori Bickers Dr [name] Dr. Roxana Hires, NP Lyda Jester, [place] [facility] [place], [place] Forestine Na, NP Audry Riles, PharmD   Please be sure to bring in all your medications bottles to every appointment.   If you have any questions or concerns before your next appointment please send [place] a message through [place] or call our office at [phone].    TO LEAVE A MESSAGE FOR THE NURSE SELECT OPTION 2, PLEASE LEAVE A MESSAGE INCLUDING: YOUR NAME DATE OF BIRTH CALL BACK NUMBER REASON FOR CALL**this is important as we prioritize the call backs  YOU WILL RECEIVE A CALL BACK THE SAME DAY AS LONG AS YOU CALL BEFORE 4:00 PM

## 2022-08-21 ENCOUNTER — Telehealth (HOSPITAL_COMMUNITY): Payer: Self-pay

## 2022-08-21 MED ORDER — POTASSIUM CHLORIDE CRYS ER 20 MEQ PO TBCR: 40.0000 meq | EXTENDED_RELEASE_TABLET | Freq: Every day | ORAL | 3 refills | Status: DC

## 2022-08-21 NOTE — Telephone Encounter (Signed)
Patient advised and verbalized understanding. Med list updated to reflect changes.   Meds ordered this encounter  Medications   potassium chloride SA (KLOR-CON M) 20 MEQ tablet    Sig: Take 2 tablets (40 mEq total) by mouth daily.    Dispense:  180 tablet    Refill:  3    Please cancel all previous orders for current medication. Change in dosage or pill size.

## 2022-08-21 NOTE — Telephone Encounter (Signed)
-----   Message from Rafael Bihari, [place] sent at 08/20/2022 10:45 AM EDT ----- K remains low. He has been taking 20 KCL daily.   Please increase to 40 KCL daily. Will repeat labs at follow up

## 2022-08-24 ENCOUNTER — Other Ambulatory Visit: Payer: Self-pay | Admitting: Psychiatry

## 2022-08-26 ENCOUNTER — Ambulatory Visit (INDEPENDENT_AMBULATORY_CARE_PROVIDER_SITE_OTHER): Payer: Medicare HMO

## 2022-08-26 VITALS — BP 116/76 | HR 80 | Temp 97.6°F | Ht 69.0 in | Wt 370.0 lb

## 2022-08-26 DIAGNOSIS — Z0000 Encounter for general adult medical examination without abnormal findings: Secondary | ICD-10-CM | POA: Diagnosis not present

## 2022-08-26 NOTE — Progress Notes (Signed)
Subjective:   [name] is a 56 y.o. male who presents for Medicare Annual/Subsequent preventive examination.  Review of Systems    No ROS. Medicare Wellness Visit. Additional risk factors are reflected in social history. Cardiac Risk Factors include: male gender;obesity (BMI >30kg/m2);smoking/ tobacco exposure     Objective:    Today's Vitals   08/26/22 1429 08/26/22 1447  BP: 116/76   Pulse: 80   Temp: 97.6 F (36.4 C)   TempSrc: Temporal   SpO2: 96%   Weight: (!) 370 lb (167.8 kg)   Height: '5\' 9"'$  (1.753 m)   PainSc:  7    Body mass index is 54.64 kg/m.     08/26/2022    3:09 PM 01/16/2022   11:23 AM 01/13/2022   10:32 AM 05/19/2021   12:11 PM 05/09/2021    9:34 PM 01/15/2021   11:14 AM 02/10/2020    7:46 PM  Advanced Directives  Does Patient Have a Medical Advance Directive? No No No No No No   Would patient like information on creating a medical advance directive? No - Patient declined No - Patient declined Yes (MAU/Ambulatory/Procedural Areas - Information given) No - Patient declined No - Patient declined No - Patient declined      Information is confidential and restricted. Go to Review Flowsheets to unlock data.    Current Medications (verified) Outpatient Encounter Medications as of 08/26/2022  Medication Sig   albuterol (VENTOLIN HFA) 108 (90 Base) MCG/ACT inhaler INHALE 1-2 PUFFS INTO THE LUNGS EVERY 4 (FOUR) HOURS AS NEEDED FOR SHORTNESS OF BREATH OR WHEEZING.   amiodarone (PACERONE) 200 MG tablet TAKE 1 TABLET (200 MG TOTAL) BY MOUTH DAILY. PATIENT TAKING ONCE DAILY   ARIPiprazole (ABILIFY) 15 MG tablet Take 1 tablet (15 mg total) by mouth daily.   atorvastatin (LIPITOR) 40 MG tablet Take 1 tablet (40 mg total) by mouth daily.   carvedilol (COREG) 3.125 MG tablet Take 1 tablet (3.125 mg total) by mouth 2 (two) times daily.   clonazePAM (KLONOPIN) 1 MG tablet Take 1 tablet (1 mg total) by mouth 2 (two) times daily as needed for anxiety.   FARXIGA 10 MG  TABS tablet TAKE 1 TABLET BY MOUTH EVERY DAY   losartan (COZAAR) 25 MG tablet Take 0.5 tablets (12.5 mg total) by mouth 2 (two) times daily.   pantoprazole (PROTONIX) 40 MG tablet Take 1 tablet (40 mg total) by mouth daily.   potassium chloride SA (KLOR-CON M) 20 MEQ tablet Take 2 tablets (40 mEq total) by mouth daily.   PRESCRIPTION MEDICATION Inhale into the lungs See admin instructions. Bipap, pressure 16/12 with 2L of O2 - use whenever sleeping   rivaroxaban (XARELTO) 20 MG TABS tablet Take 1 tablet (20 mg total) by mouth daily with supper.   sildenafil (VIAGRA) 50 MG tablet Take 1 tablet by mouth daily as needed for erectile dysfunction (begin with one tablet --may take 2 if necessary).   spironolactone (ALDACTONE) 25 MG tablet TAKE 1 TABLET (25 MG TOTAL) BY MOUTH DAILY.   torsemide (DEMADEX) 20 MG tablet Take '60mg'$  daily,alternating with 40 mg daily   [DISCONTINUED] pravastatin (PRAVACHOL) 40 MG tablet Take 40 mg by mouth daily.   No facility-administered encounter medications on file as of 08/26/2022.    Allergies (verified) Ace inhibitors, Bidil [isosorb dinitrate-hydralazine], Digoxin and related, and Buspirone   History: Past Medical History:  Diagnosis Date   Alcohol abuse    Anxiety state, unspecified    Atrial fibrillation ([place])  CHF (congestive heart failure) (HCC)    Chronic systolic heart failure (HCC)    Diabetes mellitus, type II (HCC)    Edema    Gout    History of medication noncompliance    Migraine    Obesity, unspecified    Obstructive sleep apnea    Psychiatric disorder    Pulmonary embolism (HCC)    Shortness of breath    Past Surgical History:  Procedure Laterality Date   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   Procedure: BIV ICD INSERTION CRT-D;  Surgeon: Vickie Epley, MD;  Location: [facility];  Service: Cardiovascular;  Laterality: N/A;   CARDIAC CATHETERIZATION     CARDIAC CATHETERIZATION N/A 08/13/2015   Procedure: Right/Left  Heart Cath and Coronary Angiography;  Surgeon: [name], MD;  Location: [facility];  Service: Cardiovascular;  Laterality: N/A;   PACEMAKER INSERTION     RIGHT/LEFT HEART CATH AND CORONARY ANGIOGRAPHY N/A 04/14/2017   Procedure: Right/Left Heart Cath and Coronary Angiography;  Surgeon: [name], MD;  Location: [facility];  Service: Cardiovascular;  Laterality: N/A;   TESTICLE SURGERY     Family History  Problem Relation Age of Onset   Cancer Mother        brain tumor   Hypertension Mother    Diabetes Father        Deceased, 47   Heart disease Maternal Grandmother    Hypertension Other        Family History   Stroke Other        Family History   Diabetes Other        Family History   Diabetes Daughter    Social History   Socioeconomic History   Marital status: Married    Spouse name: Not on file   Number of children: 3   Years of education: Not on file   Highest education level: Not on file  Occupational History   Occupation: DISABLED  Tobacco Use   Smoking status: Former    Packs/day: 0.50    Years: 30.00    Total pack years: 15.00    Types: Cigarettes    Quit date: 05/15/2020    Years since quitting: 2.2   Smokeless tobacco: Never   Tobacco comments:    vaping - nicotine-free products  Vaping Use   Vaping Use: Never used  Substance and Sexual Activity   Alcohol use: Not Currently    Alcohol/week: 0.0 standard drinks of alcohol   Drug use: No   Sexual activity: Not Currently  Other Topics Concern   Not on file  Social History Narrative   He smokes about a pack per day and he has been smoking since he was 56 years of age.  He drinks alcohol occasionally, but he denies any illicit drug abuse.  He is presently on disability.    Lives with wife in a 2 story home.  Has 2 children.   Previously worked in [employer], last worked in 1998.   Highest level of education:  11th grade           Social Determinants of Health   Financial  Resource Strain: Low Risk  (08/26/2022)   Overall Financial Resource Strain (CARDIA)    Difficulty of Paying Living Expenses: Not very hard  Food Insecurity: No Food Insecurity (08/26/2022)   Hunger Vital Sign    Worried About Running Out of Food in the Last Year: Never true    Ran  Out of Food in the Last Year: Never true  Transportation Needs: No Transportation Needs (08/26/2022)   PRAPARE - Hydrologist (Medical): No    Lack of Transportation (Non-Medical): No  Physical Activity: Sufficiently Active (08/26/2022)   Exercise Vital Sign    Days of Exercise per Week: 5 days    Minutes of Exercise per Session: 30 min  Stress: No Stress Concern Present (08/26/2022)   [place]    Feeling of Stress : Only a little  Social Connections: Socially Isolated (08/26/2022)   Social Connection and Isolation Panel [NHANES]    Frequency of Communication with Friends and Family: Once a week    Frequency of Social Gatherings with Friends and Family: Never    Attends Religious Services: Never    Marine scientist or Organizations: No    Attends Music therapist: Never    Marital Status: Separated    Tobacco Counseling Counseling given: Not Answered Tobacco comments: vaping - nicotine-free products   Clinical Intake:  Pre-visit preparation completed: Yes  Pain : 0-10 Pain Score: 7  Pain Type: Acute pain Pain Location: Knee Pain Orientation: Left Pain Descriptors / Indicators: Aching Pain Onset: In the past 7 days Pain Frequency: Constant     Nutritional Status: BMI > 30  Obese Nutritional Risks: None Diabetes: Yes CBG done?: No Did pt. bring in CBG monitor from home?: No  How often do you need to have someone help you when you read instructions, pamphlets, or other written materials from your doctor or pharmacy?: 1 - Never What is the last grade level you completed in school?:  11th grade  Diabetic?yes Nutrition Risk Assessment:  Has the patient had any N/V/D within the last 2 months?  Yes diarrhea Does the patient have any non-healing wounds?  No  Has the patient had any unintentional weight loss or weight gain?  No   Diabetes:  Is the patient diabetic?  Yes  If diabetic, was a CBG obtained today?  No  Did the patient bring in their glucometer from home?  No  How often do you monitor your CBG's? Not often since A1c went down.   Financial Strains and Diabetes Management:  Are you having any financial strains with the device, your supplies or your medication?  N/A .  Does the patient want to be seen by Chronic Care Management for management of their diabetes?  No  Would the patient like to be referred to a Nutritionist or for Diabetic Management?  No   Diabetic Exams:  Diabetic Eye Exam: Completed a month ago Diabetic Foot Exam: Completed 02/13/22    Interpreter Needed?: No  Information entered by :: Jillene Bucks, [facility]   Activities of Daily Living    08/26/2022    3:10 PM 01/16/2022   11:22 AM  In your present state of health, do you have any difficulty performing the following activities:  Hearing? 0   Vision? 0   Difficulty concentrating or making decisions? 1   Comment sometimes   Walking or climbing stairs? 0   Dressing or bathing? 0   Doing errands, shopping? 0 0  Preparing Food and eating ? N   Using the Toilet? N   In the past six months, have you accidently leaked urine? Y   Do you have problems with loss of bowel control? N   Managing your Medications? Y   Managing your Finances? N  Housekeeping or managing your Housekeeping? N     Patient Care Team: [name], MD as PCP - General (Internal Medicine) [name], MD as PCP - Cardiology (Cardiology) [name], MD as PCP - Advanced Heart Failure (Cardiology) [name], MD as Consulting Physician (Cardiology) Szabat, Darnelle Maffucci, [facility] (Inactive)  (Pharmacist)  Indicate any recent Medical Services you may have received from other than [facility] providers in the past year (date may be approximate).     Assessment:   This is a routine wellness examination for Charon.  Hearing/Vision screen Patient denied any hearing difficulty. No hearing aids. Patient does wear corrective lenses.  Dietary issues and exercise activities discussed: Current Exercise Habits: Home exercise routine, Type of exercise: walking, Time (Minutes): 30, Frequency (Times/Week): 5, Weekly Exercise (Minutes/Week): 150, Intensity: Mild, Exercise limited by: None identified   Goals Addressed             This Visit's Progress    Patient Stated       Patient would like to continue working on weight loss.        Depression Screen    08/26/2022    3:08 PM 05/08/2022    1:37 PM 01/23/2022    2:30 PM 11/12/2021    2:14 PM 09/10/2021    2:21 PM 08/13/2021    2:16 PM 07/08/2021   11:27 AM  PHQ 2/9 Scores  PHQ - 2 Score 1  0    0  PHQ- 9 Score            Information is confidential and restricted. Go to Review Flowsheets to unlock data.    Fall Risk    08/26/2022    3:09 PM 01/23/2022    2:30 PM 07/08/2021   11:27 AM 05/31/2021    3:30 PM 01/15/2021   11:15 AM  Fall Risk   Falls in the past year? 1 0 0 0 0  Number falls in past yr: 0 0 0  0  Injury with Fall? 1 0 0 0 0  Risk for fall due to : History of fall(s)    No Fall Risks  Follow up Falls evaluation completed        [place]:  Any stairs in or around the home? Yes  If so, are there any without handrails? No  Home free of loose throw rugs in walkways, pet beds, electrical cords, etc? Yes  Adequate lighting in your home to reduce risk of falls? Yes   ASSISTIVE DEVICES UTILIZED TO PREVENT FALLS:  Life alert? No  Use of a cane, walker or w/c? No  Grab bars in the bathroom? Yes  Shower chair or bench in shower? No  Elevated toilet seat or a handicapped toilet? No    TIMED UP AND GO:  Was the test performed? No .  Length of time to ambulate 10 feet: N/A sec.   Gait steady and fast without use of assistive device  Cognitive Function:  Patient is cogitatively intact.      08/26/2022    3:11 PM  6CIT Screen  What Year? 0 points  What month? 0 points  What time? 0 points  Count back from 20 0 points  Months in reverse 0 points  Repeat phrase 8 points  Total Score 8 points    Immunizations Immunization History  Administered Date(s) Administered   19-influenza Whole 10/20/2011   Hep A / Hep B 05/25/2018   PNEUMOCOCCAL CONJUGATE-20  02/13/2022   Pneumococcal Polysaccharide-23 08/11/2014   Tdap 10/20/2011    TDAP status: Due, Education has been provided regarding the importance of this vaccine. Advised may receive this vaccine at local pharmacy or Health Dept. Aware to provide a copy of the vaccination record if obtained from local pharmacy or Health Dept. Verbalized acceptance and understanding.  Flu Vaccine status: Declined, Education has been provided regarding the importance of this vaccine but patient still declined. Advised may receive this vaccine at local pharmacy or Health Dept. Aware to provide a copy of the vaccination record if obtained from local pharmacy or Health Dept. Verbalized acceptance and understanding.  Pneumococcal vaccine status: N/A  Covid-19 vaccine status: Declined, Education has been provided regarding the importance of this vaccine but patient still declined. Advised may receive this vaccine at local pharmacy or Health Dept.or vaccine clinic. Aware to provide a copy of the vaccination record if obtained from local pharmacy or Health Dept. Verbalized acceptance and understanding.  Qualifies for Shingles Vaccine? Yes   Zostavax completed No   Shingrix Completed?: No.    Education has been provided regarding the importance of this vaccine. Patient has been advised to call insurance company to determine out of pocket  expense if they have not yet received this vaccine. Advised may also receive vaccine at local pharmacy or Health Dept. Verbalized acceptance and understanding.  Screening Tests Health Maintenance  Topic Date Due   COLONOSCOPY (Pts 45-65yr Insurance coverage will need to be confirmed)  Never done   COVID-19 Vaccine (1) 09/11/2022 (Originally 03/27/1967)   Zoster Vaccines- Shingrix (1 of 2) 11/25/2022 (Originally 09/26/2018)   INFLUENZA VACCINE  03/01/2023 (Originally 07/01/2022)   TETANUS/TDAP  08/27/2023 (Originally 10/19/2021)   HEMOGLOBIN A1C  10/12/2022   Diabetic kidney evaluation - Urine ACR  02/14/2023   FOOT EXAM  02/14/2023   OPHTHALMOLOGY EXAM  07/04/2023   Diabetic kidney evaluation - GFR measurement  08/21/2023   Hepatitis C Screening  Completed   HIV Screening  Completed   HPV VACCINES  Aged Out    Health Maintenance  Health Maintenance Due  Topic Date Due   COLONOSCOPY (Pts [phone] coverage will need to be confirmed)  Never done    Colorectal Cancer screening: due  Lung Cancer Screening: (Low Dose CT Chest recommended if Age 54-80ears, 30 pack-year currently smoking OR have quit w/in 15years.) does qualify.   Lung Cancer Screening Referral: N/A  Additional Screening:  Hepatitis C Screening: does qualify; Completed 04/22/2018  Vision Screening: Recommended annual ophthalmology exams for early detection of glaucoma and other disorders of the eye. Is the patient up to date with their annual eye exam?  Yes  Who is the provider or what is the name of the office in which the patient attends annual eye exams? [facility] provider, would they like to be referred to a provider to establish care? No .   Dental Screening: Recommended annual dental exams for proper oral hygiene  Community Resource Referral / Chronic Care Management: CRR required this visit?  No   CCM required this visit?  No      Plan:     I have  personally reviewed and noted the following in the patient's chart:   Medical and social history Use of alcohol, tobacco or illicit drugs  Current medications and supplements including opioid prescriptions. Patient is not currently taking opioid prescriptions. Functional ability and status Nutritional status Physical activity Advanced directives List  of other physicians Hospitalizations, surgeries, and ER visits in previous 12 months Vitals Screenings to include cognitive, depression, and falls Referrals and appointments  In addition, I have reviewed and discussed with patient certain preventive protocols, quality metrics, and best practice recommendations. A written personalized care plan for preventive services as well as general preventive health recommendations were provided to patient.     Rossie Muskrat, [place]   08/26/2022   Nurse Notes:  Patient declined vaccine today.  Patient is aware he is due for colonoscopy, states he prefers to do cologuard but would like to decline ordering today.

## 2022-08-26 NOTE — Patient Instructions (Signed)
It was great speaking with you today!  Please schedule your next Medicare Wellness Visit with your Nurse Health Advisor in 1 year by calling [phone]. 

## 2022-08-28 DIAGNOSIS — G4733 Obstructive sleep apnea (adult) (pediatric): Secondary | ICD-10-CM | POA: Diagnosis not present

## 2022-08-28 NOTE — Progress Notes (Signed)
Electrophysiology Office Note Date: 08/29/2022  ID:  [name], DOB 30-May-1966, MRN [id number]  PCP: [name], MD Primary Cardiologist: Fransico Him, MD Electrophysiologist: Vickie Epley, MD   CC: Routine ICD follow-up  [name] is a 56 y.o. male seen today for Vickie Epley, MD for routine electrophysiology followup. Since last being seen in our clinic the patient reports doing OK from a cardiac perspective. SOB is near his baseline. He is going through a separation so has started smoking again. Otherwise, no complaints today.   He has not had ICD shocks.   Device History: St. Jude BiV ICD implanted 01/2022 for NICM / VF  Past Medical History:  Diagnosis Date   Alcohol abuse    Anxiety state, unspecified    Atrial fibrillation (HCC)    CHF (congestive heart failure) (HCC)    Chronic systolic heart failure (HCC)    Diabetes mellitus, type II ([place])    Edema    Gout    History of medication noncompliance    Migraine    Obesity, unspecified    Obstructive sleep apnea    Psychiatric disorder    Pulmonary embolism ([place])    Shortness of breath    Past Surgical History:  Procedure Laterality Date   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   Procedure: BIV ICD INSERTION CRT-D;  Surgeon: Vickie Epley, MD;  Location: [facility];  Service: Cardiovascular;  Laterality: N/A;   CARDIAC CATHETERIZATION     CARDIAC CATHETERIZATION N/A 08/13/2015   Procedure: Right/Left Heart Cath and Coronary Angiography;  Surgeon: [name], MD;  Location: [facility];  Service: Cardiovascular;  Laterality: N/A;   PACEMAKER INSERTION     RIGHT/LEFT HEART CATH AND CORONARY ANGIOGRAPHY N/A 04/14/2017   Procedure: Right/Left Heart Cath and Coronary Angiography;  Surgeon: [name], MD;  Location: [facility];  Service: Cardiovascular;  Laterality: N/A;   TESTICLE SURGERY      Current Outpatient Medications  Medication Sig Dispense  Refill   albuterol (VENTOLIN HFA) 108 (90 Base) MCG/ACT inhaler INHALE 1-2 PUFFS INTO THE LUNGS EVERY 4 (FOUR) HOURS AS NEEDED FOR SHORTNESS OF BREATH OR WHEEZING. 8.5 each 1   amiodarone (PACERONE) 200 MG tablet TAKE 1 TABLET (200 MG TOTAL) BY MOUTH DAILY. PATIENT TAKING ONCE DAILY 90 tablet 3   ARIPiprazole (ABILIFY) 15 MG tablet Take 1 tablet (15 mg total) by mouth daily. 30 tablet 1   atorvastatin (LIPITOR) 40 MG tablet Take 1 tablet (40 mg total) by mouth daily. 90 tablet 3   carvedilol (COREG) 3.125 MG tablet Take 1 tablet (3.125 mg total) by mouth 2 (two) times daily. 60 tablet 6   clonazePAM (KLONOPIN) 1 MG tablet Take 1 tablet (1 mg total) by mouth 2 (two) times daily as needed for anxiety. 60 tablet 1   FARXIGA 10 MG TABS tablet TAKE 1 TABLET BY MOUTH EVERY DAY 90 tablet 1   losartan (COZAAR) 25 MG tablet Take 0.5 tablets (12.5 mg total) by mouth 2 (two) times daily. 90 tablet 3   pantoprazole (PROTONIX) 40 MG tablet Take 1 tablet (40 mg total) by mouth daily. 90 tablet 1   potassium chloride SA (KLOR-CON M) 20 MEQ tablet Take 2 tablets (40 mEq total) by mouth daily. 180 tablet 3   rivaroxaban (XARELTO) 20 MG TABS tablet Take 1 tablet (20 mg total) by mouth daily with supper. 90 tablet 1   sildenafil (VIAGRA)  50 MG tablet Take 1 tablet by mouth daily as needed for erectile dysfunction (begin with one tablet --may take 2 if necessary). 15 tablet 5   spironolactone (ALDACTONE) 25 MG tablet TAKE 1 TABLET (25 MG TOTAL) BY MOUTH DAILY. 90 tablet 1   torsemide (DEMADEX) 20 MG tablet Take '60mg'$  daily,alternating with 40 mg daily 180 tablet 3   PRESCRIPTION MEDICATION Inhale into the lungs See admin instructions. Bipap, pressure 16/12 with 2L of O2 - use whenever sleeping     No current facility-administered medications for this visit.    Allergies:   Ace inhibitors, Bidil [isosorb dinitrate-hydralazine], Digoxin and related, and Buspirone   Social History: Social History   Socioeconomic  History   Marital status: Married    Spouse name: Not on file   Number of children: 3   Years of education: Not on file   Highest education level: Not on file  Occupational History   Occupation: DISABLED  Tobacco Use   Smoking status: Former    Packs/day: 0.50    Years: 30.00    Total pack years: 15.00    Types: Cigarettes    Quit date: 05/15/2020    Years since quitting: 2.2   Smokeless tobacco: Never   Tobacco comments:    vaping - nicotine-free products  Vaping Use   Vaping Use: Never used  Substance and Sexual Activity   Alcohol use: Not Currently    Alcohol/week: 0.0 standard drinks of alcohol   Drug use: No   Sexual activity: Not Currently  Other Topics Concern   Not on file  Social History Narrative   He smokes about a pack per day and he has been smoking since he was 56 years of age.  He drinks alcohol occasionally, but he denies any illicit drug abuse.  He is presently on disability.    Lives with wife in a 2 story home.  Has 2 children.   Previously worked in [employer], last worked in 1998.   Highest level of education:  11th grade           Social Determinants of Health   Financial Resource Strain: Low Risk  (08/26/2022)   Overall Financial Resource Strain (CARDIA)    Difficulty of Paying Living Expenses: Not very hard  Food Insecurity: No Food Insecurity (08/26/2022)   Hunger Vital Sign    Worried About Running Out of Food in the Last Year: Never true    Ran Out of Food in the Last Year: Never true  Transportation Needs: No Transportation Needs (08/26/2022)   PRAPARE - Hydrologist (Medical): No    Lack of Transportation (Non-Medical): No  Physical Activity: Sufficiently Active (08/26/2022)   Exercise Vital Sign    Days of Exercise per Week: 5 days    Minutes of Exercise per Session: 30 min  Stress: No Stress Concern Present (08/26/2022)   [place]    Feeling of  Stress : Only a little  Social Connections: Socially Isolated (08/26/2022)   Social Connection and Isolation Panel [NHANES]    Frequency of Communication with Friends and Family: Once a week    Frequency of Social Gatherings with Friends and Family: Never    Attends Religious Services: Never    Marine scientist or Organizations: No    Attends Archivist Meetings: Never    Marital Status: Separated  Intimate Partner Violence: Not At Risk (08/26/2022)   Humiliation,  Afraid, Rape, and Kick questionnaire    Fear of Current or Ex-Partner: No    Emotionally Abused: No    Physically Abused: No    Sexually Abused: No    Family History: Family History  Problem Relation Age of Onset   Cancer Mother        brain tumor   Hypertension Mother    Diabetes Father        Deceased, 87   Heart disease Maternal Grandmother    Hypertension Other        Family History   Stroke Other        Family History   Diabetes Other        Family History   Diabetes Daughter     Review of Systems: All other systems reviewed and are otherwise negative except as noted above.   Physical Exam: Vitals:   08/29/22 1051  BP: 110/80  Pulse: 72  Weight: (!) 370 lb (167.8 kg)  Height: 5' 8.5" (1.74 m)     GEN- The patient is well appearing, alert and oriented x 3 today.   HEENT: normocephalic, atraumatic; sclera clear, conjunctiva pink; hearing intact; oropharynx clear; neck supple, no JVP Lymph- no cervical lymphadenopathy Lungs- Clear to ausculation bilaterally, normal work of breathing.  No wheezes, rales, rhonchi Heart- Regular  rate and rhythm, no murmurs, rubs or gallops, PMI not laterally displaced GI- soft, non-tender, non-distended, bowel sounds present, no hepatosplenomegaly Extremities- no clubbing or cyanosis. Trace peripheral edema; DP/PT/radial pulses 2+ bilaterally MS- no significant deformity or atrophy Skin- warm and dry, no rash or lesion; ICD pocket well healed Psych-  euthymic mood, full affect Neuro- strength and sensation are intact  ICD interrogation- reviewed in detail today,  See PACEART report  EKG:  EKG is not ordered today. Personal review of EKG ordered  07/23/22  shows AV dual paced rhythm at 73 bpm  Recent Labs: 01/14/2022: Magnesium 2.0 01/23/2022: Hemoglobin 16.0; Platelets 182.0 04/11/2022: ALT 22; TSH 1.758 08/20/2022: B Natriuretic Peptide 231.1; BUN 17; Creatinine, Ser 1.53; Potassium 3.4; Sodium 138   Wt Readings from Last 3 Encounters:  08/29/22 (!) 370 lb (167.8 kg)  08/26/22 (!) 370 lb (167.8 kg)  08/20/22 (!) 368 lb 12.8 oz (167.3 kg)     Other studies Reviewed: Additional studies/ records that were reviewed today include: Previous EP office notes.   Assessment and Plan:  1.  Chronic systolic dysfunction s/p St. Jude CRT-D  2. H/o VT/VF euvolemic today Stable on an appropriate medical regimen Normal ICD function See Pace Art report Thresholds lowered to chronic safety margins with a 0.75V LV safety margin.  Echo 07/2022 LVEF <20% Long term prognosis concerning BAT denied by insurance CRT in place.  Needs weight loss for heart transplant or VAD.  Following closely with HF clinic  2. Obesity Body mass index is 55.44 kg/m.  Encouraged lifestyle modification   Current medicines are reviewed at length with the patient today.     Disposition:   Follow up with Dr. Quentin Ore in 6 months    Signed, Shirley Friar, PA-C  08/29/2022 11:08 AM  [facility] [address] [place] [address] [phone] (office) [phone] (fax)

## 2022-08-29 ENCOUNTER — Ambulatory Visit: Payer: Medicare HMO | Attending: Cardiology | Admitting: Student

## 2022-08-29 ENCOUNTER — Encounter: Payer: Self-pay | Admitting: Student

## 2022-08-29 VITALS — BP 110/80 | HR 72 | Ht 68.5 in | Wt 370.0 lb

## 2022-08-29 DIAGNOSIS — I5022 Chronic systolic (congestive) heart failure: Secondary | ICD-10-CM

## 2022-08-29 DIAGNOSIS — I472 Ventricular tachycardia, unspecified: Secondary | ICD-10-CM

## 2022-08-29 LAB — CUP PACEART INCLINIC DEVICE CHECK
Battery Remaining Longevity: 62 mo
Brady Statistic RA Percent Paced: 83 %
Brady Statistic RV Percent Paced: 98 %
Date Time Interrogation Session: 20230929112359
HighPow Impedance: 76.5 Ohm
Implantable Lead Implant Date: 20230213
Implantable Lead Implant Date: 20230213
Implantable Lead Implant Date: 20230213
Implantable Lead Location: 753857
Implantable Lead Location: 753859
Implantable Lead Location: 753860
Implantable Pulse Generator Implant Date: 20230213
Lead Channel Impedance Value: 1062.5 Ohm
Lead Channel Impedance Value: 462.5 Ohm
Lead Channel Impedance Value: 500 Ohm
Lead Channel Pacing Threshold Amplitude: 0.75 V
Lead Channel Pacing Threshold Amplitude: 0.75 V
Lead Channel Pacing Threshold Amplitude: 0.75 V
Lead Channel Pacing Threshold Amplitude: 0.75 V
Lead Channel Pacing Threshold Amplitude: 0.75 V
Lead Channel Pacing Threshold Amplitude: 0.75 V
Lead Channel Pacing Threshold Pulse Width: 0.5 ms
Lead Channel Pacing Threshold Pulse Width: 0.5 ms
Lead Channel Pacing Threshold Pulse Width: 0.5 ms
Lead Channel Pacing Threshold Pulse Width: 0.5 ms
Lead Channel Pacing Threshold Pulse Width: 0.5 ms
Lead Channel Pacing Threshold Pulse Width: 0.5 ms
Lead Channel Sensing Intrinsic Amplitude: 11.8 mV
Lead Channel Sensing Intrinsic Amplitude: 2.9 mV
Lead Channel Setting Pacing Amplitude: 1.5 V
Lead Channel Setting Pacing Amplitude: 2.5 V
Lead Channel Setting Pacing Amplitude: 3.5 V
Lead Channel Setting Pacing Pulse Width: 0.5 ms
Lead Channel Setting Pacing Pulse Width: 0.5 ms
Lead Channel Setting Sensing Sensitivity: 0.5 mV
Pulse Gen Serial Number: 210000080

## 2022-08-29 NOTE — Patient Instructions (Signed)
Medication Instructions:  Your physician recommends that you continue on your current medications as directed. Please refer to the Current Medication list given to you today.  *If you need a refill on your cardiac medications before your next appointment, please call your pharmacy*   Lab Work: None If you have labs (blood work) drawn today and your tests are completely normal, you will receive your results only by: MyChart Message (if you have MyChart) OR A paper copy in the mail If you have any lab test that is abnormal or we need to change your treatment, we will call you to review the results.   Follow-Up: At [facility], you and your health needs are our priority.  As part of our continuing mission to provide you with exceptional heart care, we have created designated Provider Care Teams.  These Care Teams include your primary Cardiologist (physician) and Advanced Practice Providers (APPs -  Physician Assistants and Nurse Practitioners) who all work together to provide you with the care you need, when you need it.   Your next appointment:   6 month(s)  The format for your next appointment:   In Person  Provider:   Cameron Lambert, MD    Important Information About Sugar       
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
## 2022-09-10 ENCOUNTER — Telehealth (INDEPENDENT_AMBULATORY_CARE_PROVIDER_SITE_OTHER): Payer: Medicare HMO | Admitting: Psychiatry

## 2022-09-10 ENCOUNTER — Encounter: Payer: Self-pay | Admitting: Psychiatry

## 2022-09-10 DIAGNOSIS — F4310 Post-traumatic stress disorder, unspecified: Secondary | ICD-10-CM | POA: Diagnosis not present

## 2022-09-10 DIAGNOSIS — F3132 Bipolar disorder, current episode depressed, moderate: Secondary | ICD-10-CM

## 2022-09-10 DIAGNOSIS — F410 Panic disorder [episodic paroxysmal anxiety] without agoraphobia: Secondary | ICD-10-CM | POA: Diagnosis not present

## 2022-09-10 DIAGNOSIS — F1010 Alcohol abuse, uncomplicated: Secondary | ICD-10-CM | POA: Diagnosis not present

## 2022-09-10 DIAGNOSIS — R69 Illness, unspecified: Secondary | ICD-10-CM | POA: Diagnosis not present

## 2022-09-10 MED ORDER — BUPROPION HCL ER (XL) 150 MG PO TB24: 150.0000 mg | ORAL_TABLET | Freq: Every day | ORAL | 1 refills | Status: DC

## 2022-09-10 MED ORDER — ARIPIPRAZOLE 15 MG PO TABS: 15.0000 mg | ORAL_TABLET | Freq: Every day | ORAL | 0 refills | Status: DC

## 2022-09-10 MED ORDER — CLONAZEPAM 1 MG PO TABS: ORAL_TABLET | ORAL | 1 refills | Status: DC

## 2022-09-12 ENCOUNTER — Telehealth: Payer: Medicare HMO

## 2022-09-15 ENCOUNTER — Ambulatory Visit (INDEPENDENT_AMBULATORY_CARE_PROVIDER_SITE_OTHER): Payer: Medicare HMO

## 2022-09-15 DIAGNOSIS — I5022 Chronic systolic (congestive) heart failure: Secondary | ICD-10-CM | POA: Diagnosis not present

## 2022-09-15 DIAGNOSIS — Z95810 Presence of automatic (implantable) cardiac defibrillator: Secondary | ICD-10-CM | POA: Diagnosis not present

## 2022-09-19 NOTE — Progress Notes (Signed)
EPIC Encounter for ICM Monitoring  Patient Name: [name] is a 56 y.o. male Date: 09/19/2022 Primary Care Physican: [name], MD Primary Cardiologist: Aundra Dubin Electrophysiologist: Marisa Sprinkles Pacing: 98% 06/06/2022 Weight: 360 lbs    06/16/2022 Weight: 360 lbs     06/20/2022 Weight: 356 lbs      08/13/2022 Weight: 372 lbs  09/19/2022 Weight: 372 lbs                                       Spoke with patient and heart failure questions reviewed.  Pt asymptomatic for fluid accumulation.  He is walking for a 1.5 miles.      CorVue thoracic impedance suggesting normal fluid levels with intermittent days of possible fluid accumulation within last month.    Prescribed:  Torsemide 20 mg Take 3 tablets (60 mg total) by mouth alternating 2 tablets (40 mg total) every other day. Potassium 20 mEq take 1 tablet(s) (20 mEq total) by mouth. Spironolactone 25 mg take 1 tablet daily   Labs: 07/31/2022 Creatinine 1.49, BUN 21, Potassium 3.4, Sodium 141, GFR 56 07/23/2022 Creatinine 1.46, BUN 15, Potassium 3.0, Sodium 141, GFR 57 05/30/2022 Creatinine 1.46, BUN 17, Potassium 3.5, Sodium 139, GFR 57 A complete set of results can be found in Results Review.   Recommendations:  No changes and encouraged to call if experiencing any fluid symptoms.   Follow-up plan: ICM clinic phone appointment 10/20/2022.   91 day device clinic remote transmission 10/28/2022.     EP/Cardiology Office Visits:  10/10/2022 with HF clinic.   02/10/2023 with Dr Quentin Ore.   Copy of ICM check sent to Dr. Quentin Ore.   3 month ICM trend: 09/15/2022.    12-14 Month ICM trend:     Rosalene Billings, RN 09/19/2022 10:38 AM

## 2022-09-25 ENCOUNTER — Encounter: Payer: Self-pay | Admitting: Family Medicine

## 2022-09-25 ENCOUNTER — Ambulatory Visit (INDEPENDENT_AMBULATORY_CARE_PROVIDER_SITE_OTHER): Payer: Medicare HMO

## 2022-09-25 ENCOUNTER — Ambulatory Visit (INDEPENDENT_AMBULATORY_CARE_PROVIDER_SITE_OTHER): Payer: Medicare HMO | Admitting: Family Medicine

## 2022-09-25 VITALS — BP 100/70 | HR 73 | Temp 97.7°F | Ht 68.5 in | Wt 378.0 lb

## 2022-09-25 DIAGNOSIS — R0602 Shortness of breath: Secondary | ICD-10-CM | POA: Diagnosis not present

## 2022-09-25 DIAGNOSIS — R042 Hemoptysis: Secondary | ICD-10-CM

## 2022-09-25 DIAGNOSIS — Z7901 Long term (current) use of anticoagulants: Secondary | ICD-10-CM

## 2022-09-25 DIAGNOSIS — Z86711 Personal history of pulmonary embolism: Secondary | ICD-10-CM | POA: Diagnosis not present

## 2022-09-25 DIAGNOSIS — R6883 Chills (without fever): Secondary | ICD-10-CM | POA: Diagnosis not present

## 2022-09-25 DIAGNOSIS — R051 Acute cough: Secondary | ICD-10-CM

## 2022-09-25 DIAGNOSIS — I5042 Chronic combined systolic (congestive) and diastolic (congestive) heart failure: Secondary | ICD-10-CM | POA: Diagnosis not present

## 2022-09-25 DIAGNOSIS — T502X5A Adverse effect of carbonic-anhydrase inhibitors, benzothiadiazides and other diuretics, initial encounter: Secondary | ICD-10-CM | POA: Diagnosis not present

## 2022-09-25 DIAGNOSIS — E876 Hypokalemia: Secondary | ICD-10-CM | POA: Diagnosis not present

## 2022-09-25 DIAGNOSIS — R059 Cough, unspecified: Secondary | ICD-10-CM | POA: Diagnosis not present

## 2022-09-25 LAB — PROTIME-INR
INR: 1.5 ratio — ABNORMAL HIGH (ref 0.8–1.0)
Prothrombin Time: 16.5 s — ABNORMAL HIGH (ref 9.6–13.1)

## 2022-09-25 LAB — CBC WITH DIFFERENTIAL/PLATELET
Basophils Absolute: 0.1 10*3/uL (ref 0.0–0.1)
Basophils Relative: 1.2 % (ref 0.0–3.0)
Eosinophils Absolute: 0.2 10*3/uL (ref 0.0–0.7)
Eosinophils Relative: 2.4 % (ref 0.0–5.0)
HCT: 44.3 % (ref 39.0–52.0)
Hemoglobin: 15 g/dL (ref 13.0–17.0)
Lymphocytes Relative: 39.5 % (ref 12.0–46.0)
Lymphs Abs: 2.8 10*3/uL (ref 0.7–4.0)
MCHC: 33.9 g/dL (ref 30.0–36.0)
MCV: 87.1 fl (ref 78.0–100.0)
Monocytes Absolute: 0.8 10*3/uL (ref 0.1–1.0)
Monocytes Relative: 11.6 % (ref 3.0–12.0)
Neutro Abs: 3.2 10*3/uL (ref 1.4–7.7)
Neutrophils Relative %: 45.3 % (ref 43.0–77.0)
Platelets: 203 10*3/uL (ref 150.0–400.0)
RBC: 5.09 Mil/uL (ref 4.22–5.81)
RDW: 15.1 % (ref 11.5–15.5)
WBC: 7.2 10*3/uL (ref 4.0–10.5)

## 2022-09-25 LAB — D-DIMER, QUANTITATIVE: D-Dimer, Quant: 0.19 mcg/mL FEU (ref <0.50)

## 2022-09-25 LAB — BASIC METABOLIC PANEL
BUN: 22 mg/dL (ref 6–23)
CO2: 27 mEq/L (ref 19–32)
Calcium: 9.1 mg/dL (ref 8.4–10.5)
Chloride: 105 mEq/L (ref 96–112)
Creatinine, Ser: 1.72 mg/dL — ABNORMAL HIGH (ref 0.40–1.50)
GFR: 44.67 mL/min — ABNORMAL LOW (ref >60.00)
Glucose, Bld: 97 mg/dL (ref 70–99)
Potassium: 3.8 mEq/L (ref 3.5–5.1)
Sodium: 139 mEq/L (ref 135–145)

## 2022-09-25 LAB — APTT: aPTT: 35.6 s (ref 25.4–36.8)

## 2022-09-25 LAB — POC COVID19 BINAXNOW: SARS Coronavirus 2 Ag: NEGATIVE

## 2022-09-25 LAB — POCT INFLUENZA A/B: Influenza A, POC: NEGATIVE

## 2022-09-25 NOTE — Progress Notes (Signed)
Subjective:  [name] is a 56 y.o. male who presents for a  2 day hx of chills, body aches, nasal congestion, post nasal drainage,  cough with blood in mucus.   States he did have shortness of breath only while having sex. He used his inhaler and then his breathing returned to baseline a short time later. States he took sildenafil.   Denies dizziness, chest pain, palpitations, shortness of breath, abdominal pain, vomiting or diarrhea. States his legs are swollen but this is not new and he is taking diuretics to improve this.   No other aggravating or relieving factors.  No other c/o.  ROS as in subjective.   Objective: Vitals:   09/25/22 1330  BP: 100/70  Pulse: 73  Temp: 97.7 F (36.5 C)  SpO2: 97%    General appearance: Alert, WD/WN, no distress, mildly ill appearing                             Skin: warm, no rash                           Head: no sinus tenderness                            Eyes: conjunctiva normal, corneas clear, PERRLA                            Ears: pearly TMs, external ear canals normal                          Nose: septum midline, turbinates swollen, with erythema              Mouth/throat: MMM, tongue normal, mild pharyngeal erythema                           Neck: supple, no adenopathy, no thyromegaly, nontender                          Heart: RRR                         Lungs: CTA bilaterally, no wheezes, rales, or rhonchi   Extremities: LE edema, 1+ pitting       Assessment: Hemoptysis - Plan: DG Chest 2 View, CBC with Differential/Platelet, Basic metabolic panel, Basic metabolic panel, CBC with Differential/Platelet, D-Dimer, Quantitative, D-Dimer, Quantitative  Shortness of breath - Plan: POC COVID-19, D-Dimer, Quantitative, D-Dimer, Quantitative  Chills - Plan: POC COVID-19, POCT Influenza A/B  Acute cough - Plan: DG Chest 2 View, POCT Influenza A/B  Diuretic-induced hypokalemia - Plan: Basic metabolic panel, Basic metabolic  panel  Chronic combined systolic and diastolic CHF (congestive heart failure) (HCC)  Chronic anticoagulation - Plan: APTT, Protime-INR, Protime-INR, APTT  History of pulmonary embolus (PE) - Plan: D-Dimer, Quantitative, D-Dimer, Quantitative   Plan: He is not in any acute distress but symptoms are alarming due to hx of PE, CHF, A-fib on anticoagulant and diabetes. He is high risk for ACS or PE although he reports good compliance with medications including Xarelto.  Negative Covid test.  Stat labs including troponin, PT-INR, APTT, D-dimer, BMP, CBC w/diff ordered. Stat  CXR ordered.  He was monitored until chest XR, troponin, PT-INR, BMP and CBC was resulted. He did not report having any chest pain, shortness of breath or hemoptysis while waiting.  PT-INR therapeutic and D-dimer negative. Troponin negative.  Strict precautions to call 911 or go to the ED if he develops any new or worsening symptoms.

## 2022-09-25 NOTE — Patient Instructions (Signed)
As discussed, your chest x-ray is negative for pneumonia.  Your labs are stable.  We will be in touch with your D-dimer but I suspect this will be negative since your blood is thin enough with the Xarelto.  Continue your current medications.   Follow up if you are getting worse.   Call 911 if you develop chest pain or cough up blood again or have severe shortness of breath.

## 2022-09-27 DIAGNOSIS — G4733 Obstructive sleep apnea (adult) (pediatric): Secondary | ICD-10-CM | POA: Diagnosis not present

## 2022-10-02 ENCOUNTER — Other Ambulatory Visit: Payer: Self-pay | Admitting: Psychiatry

## 2022-10-03 ENCOUNTER — Telehealth (HOSPITAL_COMMUNITY): Payer: Self-pay

## 2022-10-03 NOTE — Telephone Encounter (Signed)
Called and spoke with pt in regards to CR, pt stated he is not interested at this time.   Closed referral 

## 2022-10-07 ENCOUNTER — Other Ambulatory Visit: Payer: Self-pay | Admitting: Internal Medicine

## 2022-10-07 DIAGNOSIS — I480 Paroxysmal atrial fibrillation: Secondary | ICD-10-CM

## 2022-10-10 ENCOUNTER — Ambulatory Visit (HOSPITAL_COMMUNITY)
Admission: RE | Admit: 2022-10-10 | Discharge: 2022-10-10 | Disposition: A | Discharge status: Home / Self Care | Payer: Medicare HMO | Source: Ambulatory Visit | Attending: Cardiology | Admitting: Cardiology

## 2022-10-10 ENCOUNTER — Encounter (HOSPITAL_COMMUNITY): Payer: Self-pay | Admitting: Cardiology

## 2022-10-10 VITALS — BP 100/60 | HR 72 | Wt 371.4 lb

## 2022-10-10 DIAGNOSIS — Z79899 Other long term (current) drug therapy: Secondary | ICD-10-CM | POA: Insufficient documentation

## 2022-10-10 DIAGNOSIS — G4733 Obstructive sleep apnea (adult) (pediatric): Secondary | ICD-10-CM | POA: Insufficient documentation

## 2022-10-10 DIAGNOSIS — F410 Panic disorder [episodic paroxysmal anxiety] without agoraphobia: Secondary | ICD-10-CM | POA: Diagnosis not present

## 2022-10-10 DIAGNOSIS — I480 Paroxysmal atrial fibrillation: Secondary | ICD-10-CM | POA: Diagnosis not present

## 2022-10-10 DIAGNOSIS — Z86711 Personal history of pulmonary embolism: Secondary | ICD-10-CM | POA: Diagnosis not present

## 2022-10-10 DIAGNOSIS — I5042 Chronic combined systolic (congestive) and diastolic (congestive) heart failure: Secondary | ICD-10-CM | POA: Insufficient documentation

## 2022-10-10 DIAGNOSIS — Z95810 Presence of automatic (implantable) cardiac defibrillator: Secondary | ICD-10-CM | POA: Diagnosis not present

## 2022-10-10 DIAGNOSIS — E1122 Type 2 diabetes mellitus with diabetic chronic kidney disease: Secondary | ICD-10-CM | POA: Diagnosis not present

## 2022-10-10 DIAGNOSIS — Z6841 Body Mass Index (BMI) 40.0 and over, adult: Secondary | ICD-10-CM | POA: Insufficient documentation

## 2022-10-10 DIAGNOSIS — F17210 Nicotine dependence, cigarettes, uncomplicated: Secondary | ICD-10-CM | POA: Diagnosis not present

## 2022-10-10 DIAGNOSIS — I472 Ventricular tachycardia, unspecified: Secondary | ICD-10-CM | POA: Diagnosis not present

## 2022-10-10 DIAGNOSIS — I4901 Ventricular fibrillation: Secondary | ICD-10-CM | POA: Insufficient documentation

## 2022-10-10 DIAGNOSIS — Z7951 Long term (current) use of inhaled steroids: Secondary | ICD-10-CM | POA: Diagnosis not present

## 2022-10-10 DIAGNOSIS — R451 Restlessness and agitation: Secondary | ICD-10-CM | POA: Insufficient documentation

## 2022-10-10 DIAGNOSIS — N183 Chronic kidney disease, stage 3 unspecified: Secondary | ICD-10-CM | POA: Diagnosis not present

## 2022-10-10 DIAGNOSIS — F1011 Alcohol abuse, in remission: Secondary | ICD-10-CM | POA: Diagnosis not present

## 2022-10-10 DIAGNOSIS — I130 Hypertensive heart and chronic kidney disease with heart failure and stage 1 through stage 4 chronic kidney disease, or unspecified chronic kidney disease: Secondary | ICD-10-CM | POA: Insufficient documentation

## 2022-10-10 DIAGNOSIS — F419 Anxiety disorder, unspecified: Secondary | ICD-10-CM | POA: Insufficient documentation

## 2022-10-10 DIAGNOSIS — I428 Other cardiomyopathies: Secondary | ICD-10-CM | POA: Diagnosis not present

## 2022-10-10 DIAGNOSIS — F32A Depression, unspecified: Secondary | ICD-10-CM | POA: Diagnosis not present

## 2022-10-10 DIAGNOSIS — E785 Hyperlipidemia, unspecified: Secondary | ICD-10-CM | POA: Diagnosis not present

## 2022-10-10 DIAGNOSIS — Z7984 Long term (current) use of oral hypoglycemic drugs: Secondary | ICD-10-CM | POA: Diagnosis not present

## 2022-10-10 DIAGNOSIS — Z7901 Long term (current) use of anticoagulants: Secondary | ICD-10-CM | POA: Diagnosis not present

## 2022-10-10 DIAGNOSIS — I5022 Chronic systolic (congestive) heart failure: Secondary | ICD-10-CM

## 2022-10-10 LAB — COMPREHENSIVE METABOLIC PANEL
ALT: 23 U/L (ref 0–44)
AST: 22 U/L (ref 15–41)
Albumin: 3.5 g/dL (ref 3.5–5.0)
Alkaline Phosphatase: 120 U/L (ref 38–126)
Anion gap: 9 (ref 5–15)
BUN: 16 mg/dL (ref 6–20)
CO2: 24 mmol/L (ref 22–32)
Calcium: 8.9 mg/dL (ref 8.9–10.3)
Chloride: 107 mmol/L (ref 98–111)
Creatinine, Ser: 1.74 mg/dL — ABNORMAL HIGH (ref 0.61–1.24)
GFR, Estimated: 46 mL/min — ABNORMAL LOW (ref >60)
Glucose, Bld: 97 mg/dL (ref 70–99)
Potassium: 4.1 mmol/L (ref 3.5–5.1)
Sodium: 140 mmol/L (ref 135–145)
Total Bilirubin: 0.8 mg/dL (ref 0.3–1.2)
Total Protein: 6.9 g/dL (ref 6.5–8.1)

## 2022-10-10 LAB — HEMOGLOBIN A1C
Hgb A1c MFr Bld: 5.7 % — ABNORMAL HIGH (ref 4.8–5.6)
Mean Plasma Glucose: 116.89 mg/dL

## 2022-10-10 LAB — TSH: TSH: 4.323 u[IU]/mL (ref 0.350–4.500)

## 2022-10-10 MED ORDER — AMIODARONE HCL 200 MG PO TABS: 100.0000 mg | ORAL_TABLET | Freq: Every day | ORAL | 1 refills | Status: DC

## 2022-10-10 MED ORDER — CARVEDILOL 6.25 MG PO TABS: 3.1250 mg | ORAL_TABLET | Freq: Two times a day (BID) | ORAL | 3 refills | Status: DC

## 2022-10-10 NOTE — Patient Instructions (Signed)
DECREASE Amiodarone to 100 mg  daily   INCREASE Coreg to 6.25 mg Twice daily  You have been referred to pharmacy for [facility] they will call to schedule  You have been referred to healthy weight and wellness they will call to schedule  Labs done today, your results will be available in MyChart, we will contact you for abnormal readings.  Your physician recommends that you schedule a follow-up appointment in: 3 months with app clinic  If you have any questions or concerns before your next appointment please send [place] a message through [place] or call our office at [phone].    TO LEAVE A MESSAGE FOR THE NURSE SELECT OPTION 2, PLEASE LEAVE A MESSAGE INCLUDING: YOUR NAME DATE OF BIRTH CALL BACK NUMBER REASON FOR CALL**this is important as we prioritize the call backs  YOU WILL RECEIVE A CALL BACK THE SAME DAY AS LONG AS YOU CALL BEFORE 4:00 PM  At the [facility], you and your health needs are our priority. As part of our continuing mission to provide you with exceptional heart care, we have created designated Provider Care Teams. These Care Teams include your primary Cardiologist (physician) and Advanced Practice Providers (APPs- Physician Assistants and Nurse Practitioners) who all work together to provide you with the care you need, when you need it.   You may see any of the following providers on your designated Care Team at your next follow up: Dr Glori Bickers Dr [name] Dr. Roxana Hires, NP Lyda Jester, [place] [facility], [place] Forestine Na, NP Audry Riles, PharmD   Please be sure to bring in all your medications bottles to every appointment.   Do the following things EVERYDAY: Weigh yourself in the morning before breakfast. Write it down and keep it in a log. Take your medicines as prescribed Eat low salt foods--Limit salt (sodium) to 2000 mg per day.  Stay as active as you can everyday Limit all fluids  for the day to less than 2 liters
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
## 2022-10-10 NOTE — Addendum Note (Signed)
Encounter addended by: [name], MD on: 10/10/2022 4:42 PM  Actions taken: Clinical Note Signed

## 2022-10-10 NOTE — Progress Notes (Addendum)
Advanced Heart Failure Clinic Note   Primary Care: Dr. Scarlette [name] HF Cardiologist: Dr. Aundra [name]   HPI: [name] is a 56 y.o. male with a past medical history of NICM, EF 25-30% in March 2018, felt to be related to prior ETOH abuse. He also has a history of PE 03/2014 completed a years course of Xarelto, morbid obesity, OSA, and tobacco abuse.    He was admitted 11/12/15-11/14/15 with acute on chronic systolic CHF and palpitations. He wore a 30 day event monitor at discharge as he had frequent PVC's and questionable Afib on telemetry. Also with some NSVT, so he was started on Amiodaone, but at follow up had not started taking it. He had previously refused ICD and was seen inpatient by Dr. Rayann [name] who felt that his morbid obesity was a prohibitive factor.    He was seen in the clinic in April 2018. He had started drinking ETOH again. Volume status was stable, he is not an Entresto candidate due to angioedema with lisinopril. Weight was 411 pounds.    Admitted 04/12/17 with SOB, chest pain. D- dimer was 1.16, chest CT without central obstructing PE, however more peripheral and subsegmental pulmonary artery branches were not confidently evaluated due to his body habitus. He was started on a heparin gtt for presumed PE, however VQ scan showed no PE. Troponin was elevated, peaked at 3.37. LHC showed no CAD. Echo showed an EF of 15%, grade 2 DD, no pericarditis. He was diuresed with IV lasix, and started on torsemide '20mg'$  at discharge. Discharge weight was 405 pounds.   Admitted 5/22 through 04/24/2018 with abdominal pain, nausea, and vomiting. 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 Did not require surgical intervention. He will have follow up with GI.   Echo in 2/21 with EF 20-25%, severe LV dilation.   Presented 05/09/21 w/ SOB 2/2 acute CHF w ? PNA.  CTA showed multifocal ?PNA, no PE. Also in Afib w/ RVR in 150s on admit. Initially required bipap, developed hemoptysis and intubated. Treated w/ IV Lasix. Echo  05/10/21: LVEF < 20%. RV moderately reduced. Improved and was extubated 6/13. Went into Afib w/ RVR. Refused to wear BiPAP. Developed severe agitation/hypoxia and re-intubated.  Went into VT>>VF arrest overnight ACLS>>ROSC after 65mn. Developed refractory AFib again next morning requiring emergent cardioversion 05/16/21. He was transitioned to PO amiodarone. HR remained in 40-50's, metoprolol and sildenafil were stopped. Discharge weight 367 lbs.  St Jude CRT-D device placed in 2/23.  Echo 8/23 showed EF <20%, mild MR, RV normal.  CPX in 9/23 showed moderate functional limitation due to HF and body habitus.   Today he returns for HF follow up. He has quit smoking again.  He reports periods of anxiety.  He is separated from his wife and living in [place].  Using Bipap.  Exercises with Nustep.  No dyspnea except with moderate to heavy exertion (walking up hill).  No chest pain.  Weight up 3 lbs. He decided not to do cardiac rehab.   St Jude device interrogation: 99% BiV pacing, no AF/VT, impedance had been low but now trending back up.   ECG (personally reviewed): A-BiV dual pacing  Labs (5/13): K 4.1, creatinine 1.05 Labs (1/14): K 3.8, creatinine 1.16, BNP 54 Labs (2/14): K 3.7, creatinine 1.11, BNP 28 Labs (2/16): K 4.1, creatinine 1.03, LDL 96, HCT 40 Labs (3/16): K 3.7, K 1.13, BNP 367 Labs (8/16): BNP 43, K 3.5, creatinine 1.01 Labs (08/13/15): K 3.7, creatinine 1.18, HCT 41.3 Labs (12/16): K  3.7, creatinine 1.14, TGs 495, digoxin < 0.2 Labs (2/17): K 3.8, creatinine 0.99, LDL 107, TGs 222 Labs (5/17): K 4, creatinine 1.47 Labs (2/18): K 3.9, creatinine 1.06, hgb 15.1, TGs 163, LDL 89, HDL 28, TSH normal  Labs (5/18): K 3.8, creatinine 1.12.  Labs (1/19): K 3.8 Creatinine 1.25  Labs (2/19): K 3.8 Creatinine 1.08 Labs (4/19): K 4.1 creatinine 1/15 BNP 212  Labs (5/19): creatinine 1.1  Labs (10/19): LDL 166, K 3.8, creatinine 1.01, AST 102 => 25, ALT 125 => 37, alkaline phosphatase  233 Labs (11/20): LDL 106, TGs 232 Labs (2/21): K 3.3, creatinine 1.3 Labs (4/21): LDL 114, TGs 466, K 3.6, creatinine 1.22 Labs (12/21): K 3.5, creatinine 1.08, LDL 164, TGs 133 Labs (722): K 3.8, creatinine 1.4 Labs (8/22): K 3.8, creatinine 1.23 Labs (9/22): K 3.6, creatinine 1.26 Labs (2/23): LFTs normal, TSH normal, K 4, creatinine 1.3 Labs (8/23): K 3.4, creatinine 1.49 Labs (10/23): K 3.8, creatinine 1.72  PMH: 1. Nonischemic cardiomyopathy: Prior cath with no significant CAD.  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  Prior echoes with EF as low as 25%.  Echo (9/13) with EF 35-40%, moderate to severe LV dilation, diffuse hypokinesis, mild MR. Echo (5/15) with EF 30-35%, moderate to severe LAE, normal RV size and systolic function.  Angioedema with ACEI, headaches with hydralazine/nitrates. Echo (3/16) with EF 25-30%, severe LV dilation, normal RV size and systolic function.  [facility] 08/13/15 showed no significant coronary disease; RA mean 6, PA 33/11 mean 23, PCWP mean 13, Fick CO/CI 4.75 /1.68 (difficult study, radial artery spasm, if needs future cath would use groin).  Echo (9/16) showed EF 20-25%.   - Echo (3/18): EF 25-30%, moderate LAE - Echo 4/18 EF 15% - Echo 7/19 EF 20-25% - Echo 2/21 with EF 20-25%, severe LV dilation.  - Echo 6/22 with EF < 20%, normal RV. - St Jude CRT-D 2/23.  - Echo (8/23): EF <20%, mild MR, RV normal.  - CPX (9/23): Peak VO2 13.1, VE/VCO2 slope 37, RER 1.10.  Moderate functional limitation, body habitus and HF.  2. HTN: angioedema with ACEI.  3. OSA: on Bipap 4. Morbid obesity 5. Paroxysmal atrial fibrillation: Urgent DCCV 6/22. 6. Smoker.  7. Anxiety/panic attacks 8. PE: 5/15, diagnosed by V/Q scan. CTA chest 8/16 negative for PE.  9. NSVT, PVCs: 30 day monitor (12/16) with PVCs, PACs, no atrial fibrillation.  - Zio patch (9/19): few short NSVT runs, no atrial fibrillation, 1.1% PVCs 10. Hematuria:  Apparently had negative workup by urology.  11. ABIs (6/16) were normal 12. 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  13. Gout 14. Low back pain.  15. 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 CKD stage 3 17. Type 2 diabetes   SH: Separated, lives in [place], drinks ETOH occasionally, no drugs. Prior smoker.  Has son and daughter.     FH: No premature CAD.    Review of systems complete and found to be negative unless listed in HPI.   Current Outpatient Medications  Medication Sig Dispense Refill   albuterol (VENTOLIN HFA) 108 (90 Base) MCG/ACT inhaler INHALE 1-2 PUFFS INTO THE LUNGS EVERY 4 (FOUR) HOURS AS NEEDED FOR SHORTNESS OF BREATH OR WHEEZING. 8.5 each 1   ARIPiprazole (ABILIFY) 15 MG tablet Take 1 tablet (15 mg total) by mouth daily. 30 tablet 0   atorvastatin (LIPITOR) 40 MG tablet Take 1 tablet (40 mg total) by mouth daily. 90 tablet 3   buPROPion [facility]  XL) 150 MG 24 hr tablet Take 1 tablet (150 mg total) by mouth daily. 30 tablet 1   clonazePAM (KLONOPIN) 1 MG tablet Take 0.5 tablets (0.5 mg total) by mouth daily. May also take 1 tablet (1 mg total) at bedtime as needed for anxiety. 45 tablet 1   FARXIGA 10 MG TABS tablet TAKE 1 TABLET BY MOUTH EVERY DAY 90 tablet 1   losartan (COZAAR) 25 MG tablet Take 0.5 tablets (12.5 mg total) by mouth 2 (two) times daily. 90 tablet 3   pantoprazole (PROTONIX) 40 MG tablet Take 1 tablet (40 mg total) by mouth daily. 90 tablet 1   potassium chloride SA (KLOR-CON M) 20 MEQ tablet Take 2 tablets (40 mEq total) by mouth daily. 180 tablet 3   PRESCRIPTION MEDICATION Inhale into the lungs See admin instructions. Bipap, pressure 16/12 with 2L of O2 - use whenever sleeping     sildenafil (VIAGRA) 50 MG tablet Take 1 tablet by mouth daily as needed for erectile dysfunction (begin with one tablet --may take 2 if necessary). 15 tablet 5   spironolactone (ALDACTONE) 25 MG tablet TAKE 1 TABLET (25 MG TOTAL) BY MOUTH DAILY. 90 tablet 1   torsemide (DEMADEX) 20 MG  tablet Take '60mg'$  daily,alternating with 40 mg daily 180 tablet 3   XARELTO 20 MG TABS tablet TAKE 1 TABLET BY MOUTH DAILY WITH SUPPER. 90 tablet 1   amiodarone (PACERONE) 200 MG tablet Take 0.5 tablets (100 mg total) by mouth daily. Patient taking once daily 90 tablet 1   carvedilol (COREG) 6.25 MG tablet Take 0.5 tablets (3.125 mg total) by mouth 2 (two) times daily. 60 tablet 3   No current facility-administered medications for this encounter.   Allergies  Allergen Reactions   Ace Inhibitors Anaphylaxis and Swelling    Angioedema   Bidil [Isosorb Dinitrate-Hydralazine] Other (See Comments)    headache   Digoxin And Related     Unspecified "side effects"   Buspirone Other (See Comments)    dizziness   Social History   Socioeconomic History   Marital status: Married    Spouse name: Not on file   Number of children: 3   Years of education: Not on file   Highest education level: Not on file  Occupational History   Occupation: DISABLED  Tobacco Use   Smoking status: Former    Packs/day: 0.50    Years: 30.00    Total pack years: 15.00    Types: Cigarettes    Quit date: 05/15/2020    Years since quitting: 2.4   Smokeless tobacco: Never   Tobacco comments:    vaping - nicotine-free products  Vaping Use   Vaping Use: Never used  Substance and Sexual Activity   Alcohol use: Not Currently    Alcohol/week: 0.0 standard drinks of alcohol   Drug use: No   Sexual activity: Not Currently  Other Topics Concern   Not on file  Social History Narrative   He smokes about a pack per day and he has been smoking since he was 56 years of age.  He drinks alcohol occasionally, but he denies any illicit drug abuse.  He is presently on disability.    Lives with wife in a 2 story home.  Has 2 children.   Previously worked in [employer], last worked in 1998.   Highest level of education:  11th grade           Social Determinants of Health   Financial Resource Strain: Low  Risk  (08/26/2022)    Overall Financial Resource Strain (CARDIA)    Difficulty of Paying Living Expenses: Not very hard  Food Insecurity: No Food Insecurity (08/26/2022)   Hunger Vital Sign    Worried About Running Out of Food in the Last Year: Never true    Ran Out of Food in the Last Year: Never true  Transportation Needs: No Transportation Needs (08/26/2022)   PRAPARE - Hydrologist (Medical): No    Lack of Transportation (Non-Medical): No  Physical Activity: Sufficiently Active (08/26/2022)   Exercise Vital Sign    Days of Exercise per Week: 5 days    Minutes of Exercise per Session: 30 min  Stress: No Stress Concern Present (08/26/2022)   [place]    Feeling of Stress : Only a little  Social Connections: Socially Isolated (08/26/2022)   Social Connection and Isolation Panel [NHANES]    Frequency of Communication with Friends and Family: Once a week    Frequency of Social Gatherings with Friends and Family: Never    Attends Religious Services: Never    Marine scientist or Organizations: No    Attends Archivist Meetings: Never    Marital Status: Separated  Intimate Partner Violence: Not At Risk (08/26/2022)   Humiliation, Afraid, Rape, and Kick questionnaire    Fear of Current or Ex-Partner: No    Emotionally Abused: No    Physically Abused: No    Sexually Abused: No   Family History  Problem Relation Age of Onset   Cancer Mother        brain tumor   Hypertension Mother    Diabetes Father        Deceased, 54   Heart disease Maternal Grandmother    Hypertension Other        Family History   Stroke Other        Family History   Diabetes Other        Family History   Diabetes Daughter    BP 100/60   Pulse 72   Wt (!) 168.5 kg (371 lb 6.4 oz)   SpO2 97%   BMI 55.65 kg/m   Wt Readings from Last 3 Encounters:  10/10/22 (!) 168.5 kg (371 lb 6.4 oz)  09/25/22 (!) 171.5 kg (378  lb)  08/29/22 (!) 167.8 kg (370 lb)   PHYSICAL EXAM: General: NAD, obese.  Neck: Thick. No JVD, no thyromegaly or thyroid nodule.  Lungs: Clear to auscultation bilaterally with normal respiratory effort. CV: Nondisplaced PMI.  Heart regular S1/S2, no S3/S4, no murmur.  No peripheral edema.  No carotid bruit.  Normal pedal pulses.  Abdomen: Soft, nontender, no hepatosplenomegaly, no distention.  Skin: Intact without lesions or rashes.  Neurologic: Alert and oriented x 3.  Psych: Normal affect. Extremities: No clubbing or cyanosis.  HEENT: Normal.   ASSESSMENT & PLAN: 1. Atrial fibrillation: Paroyxsmal. Emergent DCCV in 6/22. NSR today.  - Continue amiodarone but will decrease to 100 mg daily.  Check LFTs and TSH.  Needs regular eye exam.  - Continue Xarelto. - Based on body habitus, not good ablation candidate.  2.  Chronic systolic CHF: Nonischemic cardiomyopathy. ?If ETOH has played a role (now drinks rarely). Echo (3/18) with EF 25-30%. Echo 7/19 and in 2/21 with EF 20-25%. Echo 6/22 with EF < 20%, normal RV.  Has St Jude CRT-D device now.  Echo-post CRT (8/23) showed EF <20%, mild  MR, RV normal.  CPX in 9/23 with moderate functional limitation due to HF and body habitus.  NYHA class II, not volume overloaded by exam or Optivol.   - Continue torsemide 60 daily alternating with 40 mg daily w/ 40 KCL daily.  BMET/BNP today. - Continue spironolactone 25 mg daily. - Continue Farxiga 10 mg daily. - Continue losartan 12.5 mg bid.  - Increase Coreg to 6.25 mg bid.   - No Entresto, ACEI with h/o angioedema.  - Headaches with Bidil, cannot take.  - He did not tolerate digoxin.  - has not wanted to do cardiac rehab.  - Insurance denied baroreceptor activation therapy and he has CRT now.  - I am concerned about him long-term, he is too heavy for heart transplant and needs weight loss even to get him to LVAD.    3. HTN: BP now runs low.  4. 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  Has  St Jude CRT-D device now.  5. OSA: Continue nightly BiPAP. 6. PE: 04/14/2018, diagnosed by V/Q scan. Has been on Xarelto. No bleeding issues. 7. Anxiety/Panic attacks/depression: Followed by psychiatry. 9. Hyperlipidemia: Continue atorvastatin.   10. Obesity: Body mass index is 55.65 kg/m.  - He has a diagnosis of diabetes, will see if he can get coverage for semaglutide or dulaglitide.   - [facility]  - Has not wanted to do cardiac rehab.   11. Tobacco use: He has quit again.   Followup in 3 months with APP  [name]  10/10/2022

## 2022-10-15 ENCOUNTER — Encounter: Payer: Self-pay | Admitting: Psychiatry

## 2022-10-15 ENCOUNTER — Telehealth (INDEPENDENT_AMBULATORY_CARE_PROVIDER_SITE_OTHER): Payer: Medicare HMO | Admitting: Psychiatry

## 2022-10-15 DIAGNOSIS — R69 Illness, unspecified: Secondary | ICD-10-CM | POA: Diagnosis not present

## 2022-10-15 DIAGNOSIS — F3132 Bipolar disorder, current episode depressed, moderate: Secondary | ICD-10-CM

## 2022-10-15 DIAGNOSIS — F410 Panic disorder [episodic paroxysmal anxiety] without agoraphobia: Secondary | ICD-10-CM | POA: Diagnosis not present

## 2022-10-15 DIAGNOSIS — F4310 Post-traumatic stress disorder, unspecified: Secondary | ICD-10-CM

## 2022-10-15 MED ORDER — ARIPIPRAZOLE 15 MG PO TABS: 15.0000 mg | ORAL_TABLET | Freq: Every day | ORAL | 1 refills | Status: DC

## 2022-10-15 NOTE — Patient Instructions (Signed)
Continue Abilify 15 mg daily  Continue bupropion 150 mg daily  Continue clonazepam 0.5 mg in AM, 1 mg at night  Next appointment- 1/3 at 10:30

## 2022-10-20 ENCOUNTER — Ambulatory Visit (INDEPENDENT_AMBULATORY_CARE_PROVIDER_SITE_OTHER): Payer: Medicare HMO

## 2022-10-20 DIAGNOSIS — I5022 Chronic systolic (congestive) heart failure: Secondary | ICD-10-CM | POA: Diagnosis not present

## 2022-10-20 DIAGNOSIS — Z95810 Presence of automatic (implantable) cardiac defibrillator: Secondary | ICD-10-CM | POA: Diagnosis not present

## 2022-10-22 NOTE — Progress Notes (Signed)
EPIC Encounter for ICM Monitoring  Patient Name: [name] is a 56 y.o. male Date: 10/22/2022 Primary Care Physican: [name], MD Primary Cardiologist: Aundra Dubin Electrophysiologist: Marisa Sprinkles Pacing: 99% 06/06/2022 Weight: 360 lbs    06/16/2022 Weight: 360 lbs     06/20/2022 Weight: 356 lbs      08/13/2022 Weight: 372 lbs  10/22/2022 Weight: 372 lbs                                       Spoke with patient and heart failure questions reviewed.  Pt reports he can tell he has fluid during decreased impedance but no specific symptoms.     CorVue thoracic impedance suggesting normal fluid levels with intermittent days of possible fluid accumulation within last month.    Prescribed:  Torsemide 20 mg Take 3 tablets (60 mg total) by mouth alternating 2 tablets (40 mg total) every other day. Potassium 20 mEq take 1 tablet(s) (20 mEq total) by mouth. Spironolactone 25 mg take 1 tablet daily   Labs: 10/10/2022 Creatinine 1.74, BUN 16, Potassium 4.1, Sodium 140, GFR 46 09/25/2022 Creatinine 1.72, BUN 22, Potassium 3.8, Sodium 139, GFR 44.67  08/20/2022 Creatinine 1.53, BUN 17, Potassium 3.4, Sodium 138, GFR 54  07/31/2022 Creatinine 1.49, BUN 21, Potassium 3.4, Sodium 141, GFR 56 07/23/2022 Creatinine 1.46, BUN 15, Potassium 3.0, Sodium 141, GFR 57 05/30/2022 Creatinine 1.46, BUN 17, Potassium 3.5, Sodium 139, GFR 57 A complete set of results can be found in Results Review.   Recommendations:  Recommendation to limit salt intake to 2000 mg daily and fluid intake to 64 oz daily.  Encouraged to call if experiencing any fluid symptoms.    Follow-up plan: ICM clinic phone appointment 11/25/2022.   91 day device clinic remote transmission 10/28/2022.     EP/Cardiology Office Visits: 01/09/2023 with HF clinic.   02/10/2023 with Dr Quentin Ore.   Copy of ICM check sent to Dr. Quentin Ore.    3 month ICM trend: 10/20/2022.    12-14 Month ICM trend:     Rosalene Billings, RN 10/22/2022 10:29  AM

## 2022-10-27 DIAGNOSIS — G4733 Obstructive sleep apnea (adult) (pediatric): Secondary | ICD-10-CM | POA: Diagnosis not present

## 2022-10-28 ENCOUNTER — Ambulatory Visit (INDEPENDENT_AMBULATORY_CARE_PROVIDER_SITE_OTHER): Payer: Medicare HMO

## 2022-10-28 DIAGNOSIS — I472 Ventricular tachycardia, unspecified: Secondary | ICD-10-CM | POA: Diagnosis not present

## 2022-10-28 LAB — CUP PACEART REMOTE DEVICE CHECK
Battery Remaining Longevity: 74 mo
Battery Remaining Percentage: 83 %
Battery Voltage: 2.98 V
Brady Statistic AP VP Percent: 84 %
Brady Statistic AP VS Percent: 1.3 %
Brady Statistic AS VP Percent: 15 %
Brady Statistic AS VS Percent: 1 %
Brady Statistic RA Percent Paced: 85 %
Date Time Interrogation Session: 20231128020014
HighPow Impedance: 69 Ohm
Implantable Lead Connection Status: 753985
Implantable Lead Connection Status: 753985
Implantable Lead Connection Status: 753985
Implantable Lead Implant Date: 20230213
Implantable Lead Implant Date: 20230213
Implantable Lead Implant Date: 20230213
Implantable Lead Location: 753857
Implantable Lead Location: 753859
Implantable Lead Location: 753860
Implantable Pulse Generator Implant Date: 20230213
Lead Channel Impedance Value: 440 Ohm
Lead Channel Impedance Value: 460 Ohm
Lead Channel Impedance Value: 900 Ohm
Lead Channel Pacing Threshold Amplitude: 0.75 V
Lead Channel Pacing Threshold Amplitude: 0.75 V
Lead Channel Pacing Threshold Amplitude: 0.75 V
Lead Channel Pacing Threshold Pulse Width: 0.5 ms
Lead Channel Pacing Threshold Pulse Width: 0.5 ms
Lead Channel Pacing Threshold Pulse Width: 0.5 ms
Lead Channel Sensing Intrinsic Amplitude: 11.8 mV
Lead Channel Sensing Intrinsic Amplitude: 3.7 mV
Lead Channel Setting Pacing Amplitude: 1.5 V
Lead Channel Setting Pacing Amplitude: 2 V
Lead Channel Setting Pacing Amplitude: 2.5 V
Lead Channel Setting Pacing Pulse Width: 0.5 ms
Lead Channel Setting Pacing Pulse Width: 0.5 ms
Lead Channel Setting Sensing Sensitivity: 0.5 mV
Pulse Gen Serial Number: 210000080
Zone Setting Status: 755011

## 2022-11-17 ENCOUNTER — Other Ambulatory Visit: Payer: Self-pay | Admitting: Psychiatry

## 2022-11-17 DIAGNOSIS — F3132 Bipolar disorder, current episode depressed, moderate: Secondary | ICD-10-CM
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
## 2022-11-25 ENCOUNTER — Ambulatory Visit (INDEPENDENT_AMBULATORY_CARE_PROVIDER_SITE_OTHER): Payer: Medicare HMO

## 2022-11-25 DIAGNOSIS — Z95810 Presence of automatic (implantable) cardiac defibrillator: Secondary | ICD-10-CM | POA: Diagnosis not present

## 2022-11-25 DIAGNOSIS — I5022 Chronic systolic (congestive) heart failure: Secondary | ICD-10-CM

## 2022-11-25 NOTE — Progress Notes (Signed)
EPIC Encounter for ICM Monitoring  Patient Name: [name] is a 56 y.o. male Date: 11/25/2022 Primary Care Physican: [name], MD Primary Cardiologist: Aundra Dubin Electrophysiologist: Marisa Sprinkles Pacing: 99% 06/06/2022 Weight: 360 lbs    06/16/2022 Weight: 360 lbs     06/20/2022 Weight: 356 lbs      08/13/2022 Weight: 372 lbs  10/22/2022 Weight: 372 lbs   11/25/2022 Weight: 365 lbs                                     Spoke with patient and heart failure questions reviewed.  Transmission results reviewed.  Pt asymptomatic for fluid accumulation.  Reports feeling well at this time and voices no complaints.  He may have eaten holiday foods higher in salt contributing to decreased impedance.    CorVue thoracic impedance suggesting possible fluid accumulation starting 12/21 but returned close to baseline.    Prescribed:  Torsemide 20 mg Take 3 tablets (60 mg total) by mouth alternating 2 tablets (40 mg total) every other day. Potassium 20 mEq take 1 tablet(s) (20 mEq total) by mouth. Spironolactone 25 mg take 1 tablet daily   Labs: 10/10/2022 Creatinine 1.74, BUN 16, Potassium 4.1, Sodium 140, GFR 46 09/25/2022 Creatinine 1.72, BUN 22, Potassium 3.8, Sodium 139, GFR 44.67  08/20/2022 Creatinine 1.53, BUN 17, Potassium 3.4, Sodium 138, GFR 54  07/31/2022 Creatinine 1.49, BUN 21, Potassium 3.4, Sodium 141, GFR 56 07/23/2022 Creatinine 1.46, BUN 15, Potassium 3.0, Sodium 141, GFR 57 05/30/2022 Creatinine 1.46, BUN 17, Potassium 3.5, Sodium 139, GFR 57 A complete set of results can be found in Results Review.   Recommendations:  Recommendation to limit salt intake.  Encouraged to call if experiencing any fluid symptoms.     Follow-up plan: ICM clinic phone appointment 12/29/2022.   91 day device clinic remote transmission 01/27/2023.     EP/Cardiology Office Visits: 01/09/2023 with HF clinic.   02/10/2023 with Dr Quentin Ore.   Copy of ICM check sent to Dr. Quentin Ore.    3 month ICM trend:  11/25/2022.    12-14 Month ICM trend:     Rosalene Billings, RN 11/25/2022 9:00 AM

## 2022-11-26 DIAGNOSIS — G4733 Obstructive sleep apnea (adult) (pediatric): Secondary | ICD-10-CM | POA: Diagnosis not present

## 2022-11-26 NOTE — Progress Notes (Signed)
Remote ICD transmission.   

## 2022-11-28 ENCOUNTER — Ambulatory Visit (INDEPENDENT_AMBULATORY_CARE_PROVIDER_SITE_OTHER): Payer: Medicare HMO

## 2022-11-28 ENCOUNTER — Encounter: Payer: Self-pay | Admitting: Family Medicine

## 2022-11-28 ENCOUNTER — Ambulatory Visit (INDEPENDENT_AMBULATORY_CARE_PROVIDER_SITE_OTHER): Payer: Medicare HMO | Admitting: Family Medicine

## 2022-11-28 VITALS — BP 114/84 | HR 73 | Temp 97.6°F | Ht 68.5 in | Wt 373.0 lb

## 2022-11-28 DIAGNOSIS — M25421 Effusion, right elbow: Secondary | ICD-10-CM

## 2022-11-28 DIAGNOSIS — M25521 Pain in right elbow: Secondary | ICD-10-CM

## 2022-11-28 LAB — CBC WITH DIFFERENTIAL/PLATELET
Basophils Absolute: 0.1 10*3/uL (ref 0.0–0.1)
Basophils Relative: 1.3 % (ref 0.0–3.0)
Eosinophils Absolute: 0.2 10*3/uL (ref 0.0–0.7)
Eosinophils Relative: 2.1 % (ref 0.0–5.0)
HCT: 47.8 % (ref 39.0–52.0)
Hemoglobin: 16.2 g/dL (ref 13.0–17.0)
Lymphocytes Relative: 41.2 % (ref 12.0–46.0)
Lymphs Abs: 3.3 10*3/uL (ref 0.7–4.0)
MCHC: 33.8 g/dL (ref 30.0–36.0)
MCV: 86.1 fl (ref 78.0–100.0)
Monocytes Absolute: 0.9 10*3/uL (ref 0.1–1.0)
Monocytes Relative: 10.9 % (ref 3.0–12.0)
Neutro Abs: 3.6 10*3/uL (ref 1.4–7.7)
Neutrophils Relative %: 44.5 % (ref 43.0–77.0)
Platelets: 248 10*3/uL (ref 150.0–400.0)
RBC: 5.56 Mil/uL (ref 4.22–5.81)
RDW: 15.3 % (ref 11.5–15.5)
WBC: 8.1 10*3/uL (ref 4.0–10.5)

## 2022-11-28 MED ORDER — DICLOFENAC SODIUM 1 % EX GEL: 2.0000 g | Freq: Four times a day (QID) | CUTANEOUS | 0 refills | Status: DC

## 2022-11-28 NOTE — Patient Instructions (Addendum)
Your labs and X ray are ok.   Use an ice pack, elevate your elbow on a pillow and take Tylenol 1,000 mg twice daily.  You can use Voltaren gel on your elbow for the next few days.   If you are not improving, follow up with [place] next week or go to an urgent care over the weekend if you are getting much worse.

## 2022-11-28 NOTE — Progress Notes (Unsigned)
Subjective:     Patient ID: [name], male    DOB: 21-Nov-1966, 56 y.o.   MRN: [id number]  Chief Complaint  Patient presents with   Elbow Pain    Right elbow achy pain since yesterday. Can barely move, when does move it becomes a sharp pain and tender to the touch.     HPI Patient is in today for acute onset of right elbow swelling, pain and limited movement. Pain is shooting down his right forearm. No other arthralgias or myalgias. No fever, chills, N/V Denies injury or similar pain.   ?hx of gout    Health Maintenance Due  Topic Date Due   COVID-19 Vaccine (1) Never done   COLONOSCOPY (Pts 45-80yr Insurance coverage will need to be confirmed)  Never done   Zoster Vaccines- Shingrix (1 of 2) Never done   DTaP/Tdap/Td (2 - Td or Tdap) 10/19/2021    Past Medical History:  Diagnosis Date   Alcohol abuse    Anxiety state, unspecified    Atrial fibrillation (HCC)    CHF (congestive heart failure) (HCC)    Chronic systolic heart failure (HCC)    Diabetes mellitus, type II (HCC)    Edema    Gout    History of medication noncompliance    Migraine    Obesity, unspecified    Obstructive sleep apnea    Psychiatric disorder    Pulmonary embolism ([place]    Shortness of breath     Past Surgical History:  Procedure Laterality Date   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   Procedure: BIV ICD INSERTION CRT-D;  Surgeon: LVickie Epley MD;  [facility];  Service: Cardiovascular;  Laterality: N/A;   CARDIAC CATHETERIZATION     CARDIAC CATHETERIZATION N/A 08/13/2015   Procedure: Right/Left Heart Cath and Coronary Angiography;  Surgeon: DLarey Dresser MD;  [facility];  Service: Cardiovascular;  Laterality: N/A;   PACEMAKER INSERTION     RIGHT/LEFT HEART CATH AND CORONARY ANGIOGRAPHY N/A 04/14/2017   Procedure: Right/Left Heart Cath and Coronary Angiography;  Surgeon: MLarey Dresser MD;  [facility];  Service:  Cardiovascular;  Laterality: N/A;   TESTICLE SURGERY      Family History  Problem Relation Age of Onset   Cancer Mother        brain tumor   Hypertension Mother    Diabetes Father        Deceased, 566  Heart disease Maternal Grandmother    Hypertension Other        Family History   Stroke Other        Family History   Diabetes Other        Family History   Diabetes Daughter     Social History   Socioeconomic History   Marital status: Married    Spouse name: Not on file   Number of children: 3   Years of education: Not on file   Highest education level: Not on file  Occupational History   Occupation: DISABLED  Tobacco Use   Smoking status: Former    Packs/day: 0.50    Years: 30.00    Total pack years: 15.00    Types: Cigarettes    Quit date: 05/15/2020    Years since quitting: 2.5   Smokeless tobacco: Never   Tobacco comments:    vaping - nicotine-free products  Vaping Use   Vaping Use: Never used  Substance and  Sexual Activity   Alcohol use: Not Currently    Alcohol/week: 0.0 standard drinks of alcohol   Drug use: No   Sexual activity: Not Currently  Other Topics Concern   Not on file  Social History Narrative   He smokes about a pack per day and he has been smoking since he was 56 years of age.  He drinks alcohol occasionally, but he denies any illicit drug abuse.  He is presently on disability.    Lives with wife in a 2 story home.  Has 2 children.   Previously worked in [employer], last worked in 1998.   Highest level of education:  11th grade           Social Determinants of Health   Financial Resource Strain: Low Risk  (08/26/2022)   Overall Financial Resource Strain (CARDIA)    Difficulty of Paying Living Expenses: Not very hard  Food Insecurity: No Food Insecurity (08/26/2022)   Hunger Vital Sign    Worried About Running Out of Food in the Last Year: Never true    Ran Out of Food in the Last Year: Never true  Transportation Needs: No  Transportation Needs (08/26/2022)   PRAPARE - Hydrologist (Medical): No    Lack of Transportation (Non-Medical): No  Physical Activity: Sufficiently Active (08/26/2022)   Exercise Vital Sign    Days of Exercise per Week: 5 days    Minutes of Exercise per Session: 30 min  Stress: No Stress Concern Present (08/26/2022)   [place]    Feeling of Stress : Only a little  Social Connections: Socially Isolated (08/26/2022)   Social Connection and Isolation Panel [NHANES]    Frequency of Communication with Friends and Family: Once a week    Frequency of Social Gatherings with Friends and Family: Never    Attends Religious Services: Never    Marine scientist or Organizations: No    Attends Archivist Meetings: Never    Marital Status: Separated  Intimate Partner Violence: Not At Risk (08/26/2022)   Humiliation, Afraid, Rape, and Kick questionnaire    Fear of Current or Ex-Partner: No    Emotionally Abused: No    Physically Abused: No    Sexually Abused: No    Outpatient Medications Prior to Visit  Medication Sig Dispense Refill   albuterol (VENTOLIN HFA) 108 (90 Base) MCG/ACT inhaler INHALE 1-2 PUFFS INTO THE LUNGS EVERY 4 (FOUR) HOURS AS NEEDED FOR SHORTNESS OF BREATH OR WHEEZING. 8.5 each 1   amiodarone (PACERONE) 200 MG tablet Take 0.5 tablets (100 mg total) by mouth daily. Patient taking once daily 90 tablet 1   ARIPiprazole (ABILIFY) 15 MG tablet Take 1 tablet (15 mg total) by mouth daily. 90 tablet 0   atorvastatin (LIPITOR) 40 MG tablet Take 1 tablet (40 mg total) by mouth daily. 90 tablet 3   carvedilol (COREG) 6.25 MG tablet Take 0.5 tablets (3.125 mg total) by mouth 2 (two) times daily. 60 tablet 3   FARXIGA 10 MG TABS tablet TAKE 1 TABLET BY MOUTH EVERY DAY 90 tablet 1   losartan (COZAAR) 25 MG tablet Take 0.5 tablets (12.5 mg total) by mouth 2 (two) times daily. 90 tablet 3    pantoprazole (PROTONIX) 40 MG tablet Take 1 tablet (40 mg total) by mouth daily. 90 tablet 1   potassium chloride SA (KLOR-CON M) 20 MEQ tablet Take 2 tablets (40 mEq total) by  mouth daily. 180 tablet 3   PRESCRIPTION MEDICATION Inhale into the lungs See admin instructions. Bipap, pressure 16/12 with 2L of O2 - use whenever sleeping     sildenafil (VIAGRA) 50 MG tablet Take 1 tablet by mouth daily as needed for erectile dysfunction (begin with one tablet --may take 2 if necessary). 15 tablet 5   spironolactone (ALDACTONE) 25 MG tablet TAKE 1 TABLET (25 MG TOTAL) BY MOUTH DAILY. 90 tablet 1   torsemide (DEMADEX) 20 MG tablet Take '60mg'$  daily,alternating with 40 mg daily 180 tablet 3   XARELTO 20 MG TABS tablet TAKE 1 TABLET BY MOUTH DAILY WITH SUPPER. 90 tablet 1   buPROPion (WELLBUTRIN XL) 150 MG 24 hr tablet Take 1 tablet (150 mg total) by mouth daily. 30 tablet 1   clonazePAM (KLONOPIN) 1 MG tablet Take 0.5 tablets (0.5 mg total) by mouth daily. May also take 1 tablet (1 mg total) at bedtime as needed for anxiety. 45 tablet 1   No facility-administered medications prior to visit.    Allergies  Allergen Reactions   Ace Inhibitors Anaphylaxis and Swelling    Angioedema   Bidil [Isosorb Dinitrate-Hydralazine] Other (See Comments)    headache   Digoxin And Related     Unspecified "side effects"   Buspirone Other (See Comments)    dizziness    ROS     Objective:    Physical Exam Constitutional:      General: He is not in acute distress.    Appearance: He is not ill-appearing.  Cardiovascular:     Rate and Rhythm: Normal rate.  Pulmonary:     Effort: Pulmonary effort is normal.  Musculoskeletal:     Right upper arm: Normal.     Left upper arm: Normal.     Right elbow: Swelling present. Decreased range of motion. Tenderness present in medial epicondyle.     Left elbow: Normal.     Right forearm: Normal.     Left forearm: Normal.     Right wrist: Normal.     Left wrist:  Normal.     Right hand: Normal. No tenderness. Normal strength. Normal sensation. Normal capillary refill. Normal pulse.     Left hand: Normal.     Cervical back: Normal range of motion and neck supple.  Skin:    General: Skin is warm and dry.     Findings: No erythema or rash.  Neurological:     General: No focal deficit present.     Mental Status: He is alert and oriented to person, place, and time.  Psychiatric:        Mood and Affect: Mood normal.        Behavior: Behavior normal.     BP 114/84 (BP Location: Left Arm, Patient Position: Sitting, Cuff Size: Large)   Pulse 73   Temp 97.6 F (36.4 C) (Temporal)   Ht 5' 8.5" (1.74 m)   Wt (!) 373 lb (169.2 kg)   SpO2 97%   BMI 55.89 kg/m  Wt Readings from Last 3 Encounters:  11/28/22 (!) 373 lb (169.2 kg)  10/10/22 (!) 371 lb 6.4 oz (168.5 kg)  09/25/22 (!) 378 lb (171.5 kg)       Assessment & Plan:   Problem List Items Addressed This Visit   None Visit Diagnoses     Pain and swelling of elbow, right    -  Primary   Relevant Orders   CBC with Differential/Platelet (Completed)   DG Elbow Complete  Right (Completed)      No sign of infection or DVT of RUE.  No joint effusion and elbow X ray is negative.  CBC normal.    I am having Kooper L. Halfhill maintain his PRESCRIPTION MEDICATION, pantoprazole, spironolactone, albuterol, sildenafil, Farxiga, torsemide, losartan, atorvastatin, potassium chloride SA, buPROPion, clonazePAM, Xarelto, amiodarone, carvedilol, and ARIPiprazole.  No orders of the defined types were placed in this encounter.

## 2022-12-01 ENCOUNTER — Ambulatory Visit (HOSPITAL_COMMUNITY)
Admission: EM | Admit: 2022-12-01 | Discharge: 2022-12-01 | Disposition: A | Discharge status: Home / Self Care | Payer: Medicare HMO | Attending: Internal Medicine | Admitting: Internal Medicine

## 2022-12-01 DIAGNOSIS — M7021 Olecranon bursitis, right elbow: Secondary | ICD-10-CM

## 2022-12-01 MED ORDER — PREDNISONE 20 MG PO TABS: 20.0000 mg | ORAL_TABLET | Freq: Every day | ORAL | 0 refills | Status: AC

## 2022-12-01 NOTE — Discharge Instructions (Signed)
Apply a compressive ACE bandage.  Rest and elevate the affected painful area.   Apply cold compresses intermittently as needed.   As pain recedes, begin normal activities slowly as tolerated.   Please return to urgent care if symptoms worsen

## 2022-12-01 NOTE — ED Triage Notes (Signed)
Pt is here for right arm pain . Pt stated he woke up 7days ago with right arm pain causing pain and discomfort. Pt saw pcp on  11/27/2022,. However, pt states pain has increased and was instructed to come to Urgent care for revaluation .

## 2022-12-02 NOTE — ED Provider Notes (Signed)
[place]    CSN: [id number] Arrival date & time: 12/01/22  1729      History   Chief Complaint Chief Complaint  Patient presents with   Arm Pain    HPI [name] is a 57 y.o. male comes to the urgent care with 1 week history of right elbow pain.  Pain is of moderate severity, throbbing and aggravated by palpation or movement of the right elbow.  Patient denies any trauma to the right elbow.  No fever or chills.  No heavy lifting.  No repetitive hand movement.  Patient denies any known relieving factors.  Pain does not radiate and is associated with swelling over the right elbow.  Patient was seen by the primary care provider on 11/27/2022 and was given diclofenac gel to apply.  There is no improvement in pain level as well as swelling.  Patient has a remote history of gout but his gout symptoms were confined to his feet.  HPI  Past Medical History:  Diagnosis Date   Alcohol abuse    Anxiety state, unspecified    Atrial fibrillation ([place])    CHF (congestive heart failure) ([place])    Chronic systolic heart failure (HCC)    Diabetes mellitus, type II ([place])    Edema    Gout    History of medication noncompliance    Migraine    Obesity, unspecified    Obstructive sleep apnea    Psychiatric disorder    Pulmonary embolism ([place])    Shortness of breath     Patient Active Problem List   Diagnosis Date Noted   History of pulmonary embolus (PE) 09/25/2022   Long toenail 02/13/2022   COVID 01/14/2022   ICD (implantable cardioverter-defibrillator) in place 01/13/2022   Diuretic-induced hypokalemia 07/08/2021   Hemoptysis 05/10/2021   Colon cancer screening 11/19/2020   Hyperlipidemia with target LDL less than 100 10/12/2019   Screen for colon cancer 10/12/2019   Hidradenitis suppurativa of left axilla 10/12/2019   Benign prostatic hyperplasia without lower urinary tract symptoms 10/12/2019   Claudication of both lower extremities ([place]) 04/20/2019   Drug-induced  erectile dysfunction 02/28/2019   Vitamin D deficiency disease 01/20/2019   Osteoarthritis of both ankles and feet 11/30/2018   Diabetic neuropathy, painful ([place]) 11/30/2018   Fatty liver disease, nonalcoholic 41/66/0630   Thiamin deficiency 04/29/2018   Cholelithiasis 04/22/2018   Hyperbilirubinemia    PE (pulmonary thromboembolism) ([place]) 04/12/2017   Hereditary and idiopathic peripheral neuropathy 07/16/2016   Paroxysmal atrial fibrillation (HCC)    Chronic anticoagulation, secondary to PE 03/2014 12/27/2014   Osteoarthritis of both knees 12/27/2014   NSVT (nonsustained ventricular tachycardia) ([place]) 09/26/2013   Alcohol abuse 08/10/2013   Hypertriglyceridemia 06/29/2013   Nonischemic cardiomyopathy ([place]) 01/26/2013   Type II diabetes mellitus with manifestations ([place]) 04/13/2012   Shortness of breath 02/18/2012   Tobacco user 02/18/2012   ED (erectile dysfunction) 07/11/2011   ATRIAL FIBRILLATION, PAROXYSMAL 09/18/2010   Morbid obesity ([place]) 06/11/2010   Essential hypertension 06/11/2010   Chronic combined systolic and diastolic CHF (congestive heart failure) ([place]) 06/11/2010   Esophageal reflux 06/11/2010   OSA (obstructive sleep apnea) with BiPap and oxygen 06/11/2010    Past Surgical History:  Procedure Laterality Date   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   Procedure: BIV ICD INSERTION CRT-D;  Surgeon: Vickie Epley, MD;  Location: [facility];  Service: Cardiovascular;  Laterality: N/A;   CARDIAC CATHETERIZATION     CARDIAC CATHETERIZATION N/A  08/13/2015   Procedure: Right/Left Heart Cath and Coronary Angiography;  Surgeon: [name], MD;  Location: [facility];  Service: Cardiovascular;  Laterality: N/A;   PACEMAKER INSERTION     RIGHT/LEFT HEART CATH AND CORONARY ANGIOGRAPHY N/A 04/14/2017   Procedure: Right/Left Heart Cath and Coronary Angiography;  Surgeon: [name], MD;  Location: [facility];  Service: Cardiovascular;   Laterality: N/A;   TESTICLE SURGERY         Home Medications    Prior to Admission medications   Medication Sig Start Date End Date Taking? Authorizing Provider  predniSONE (DELTASONE) 20 MG tablet Take 1 tablet (20 mg total) by mouth daily for 5 days. 12/01/22 12/06/22 Yes Kaaren Nass, Myrene Galas, MD  albuterol (VENTOLIN HFA) 108 (90 Base) MCG/ACT inhaler INHALE 1-2 PUFFS INTO THE LUNGS EVERY 4 (FOUR) HOURS AS NEEDED FOR SHORTNESS OF BREATH OR WHEEZING. 06/04/22   [name], MD  amiodarone (PACERONE) 200 MG tablet Take 0.5 tablets (100 mg total) by mouth daily. Patient taking once daily 10/10/22   [name], MD  ARIPiprazole (ABILIFY) 15 MG tablet Take 1 tablet (15 mg total) by mouth daily. 11/17/22 02/15/23  [name], MD  atorvastatin (LIPITOR) 40 MG tablet Take 1 tablet (40 mg total) by mouth daily. 08/20/22   Rafael Bihari, FNP  buPROPion (WELLBUTRIN XL) 150 MG 24 hr tablet Take 1 tablet (150 mg total) by mouth daily. 09/10/22 11/09/22  [name], MD  carvedilol (COREG) 6.25 MG tablet Take 0.5 tablets (3.125 mg total) by mouth 2 (two) times daily. 10/10/22   [name], MD  clonazePAM (KLONOPIN) 1 MG tablet Take 0.5 tablets (0.5 mg total) by mouth daily. May also take 1 tablet (1 mg total) at bedtime as needed for anxiety. 09/10/22 11/09/22  [name], MD  diclofenac Sodium (VOLTAREN) 1 % GEL Apply 2 g topically 4 (four) times daily. 11/28/22   Henson, Vickie L, NP-C  FARXIGA 10 MG TABS tablet TAKE 1 TABLET BY MOUTH EVERY DAY 07/02/22   [name], MD  losartan (COZAAR) 25 MG tablet Take 0.5 tablets (12.5 mg total) by mouth 2 (two) times daily. 07/23/22   [name], MD  pantoprazole (PROTONIX) 40 MG tablet Take 1 tablet (40 mg total) by mouth daily. 02/13/22   [name], MD  potassium chloride SA (KLOR-CON M) 20 MEQ tablet Take 2 tablets (40 mEq total) by mouth daily. 08/21/22   [place], Maricela Bo, FNP  PRESCRIPTION MEDICATION Inhale into the lungs See  admin instructions. Bipap, pressure 16/12 with 2L of O2 - use whenever sleeping    [provider]  sildenafil (VIAGRA) 50 MG tablet Take 1 tablet by mouth daily as needed for erectile dysfunction (begin with one tablet --may take 2 if necessary). 06/05/22   [name], MD  spironolactone (ALDACTONE) 25 MG tablet TAKE 1 TABLET (25 MG TOTAL) BY MOUTH DAILY. 03/31/22   [name], MD  torsemide (DEMADEX) 20 MG tablet Take '60mg'$  daily,alternating with 40 mg daily 07/23/22   [name], MD  XARELTO 20 MG TABS tablet TAKE 1 TABLET BY MOUTH DAILY WITH SUPPER. 10/07/22   [name], MD  pravastatin (PRAVACHOL) 40 MG tablet Take 40 mg by mouth daily.  02/20/12  [provider]    Family History Family History  Problem Relation Age of Onset   Cancer Mother        brain tumor  Hypertension Mother    Diabetes Father        Deceased, 56   Heart disease Maternal Grandmother    Hypertension Other        Family History   Stroke Other        Family History   Diabetes Other        Family History   Diabetes Daughter     Social History Social History   Tobacco Use   Smoking status: Former    Packs/day: 0.50    Years: 30.00    Total pack years: 15.00    Types: Cigarettes    Quit date: 05/15/2020    Years since quitting: 2.5   Smokeless tobacco: Never   Tobacco comments:    vaping - nicotine-free products  Vaping Use   Vaping Use: Never used  Substance Use Topics   Alcohol use: Not Currently    Alcohol/week: 0.0 standard drinks of alcohol   Drug use: No     Allergies   Ace inhibitors, Bidil [isosorb dinitrate-hydralazine], Digoxin and related, and Buspirone   Review of Systems Review of Systems  Musculoskeletal:  Positive for arthralgias. Negative for gait problem and joint swelling.  Skin:  Positive for color change. Negative for pallor and rash.     Physical Exam Triage Vital Signs ED Triage Vitals  Enc Vitals Group     BP 12/01/22 1851  (!) 128/96     Pulse Rate 12/01/22 1851 74     Resp 12/01/22 1851 16     Temp 12/01/22 1851 98 F (36.7 C)     Temp Source 12/01/22 1851 Oral     SpO2 12/01/22 1851 98 %     Weight --      Height --      Head Circumference --      Peak Flow --      Pain Score 12/01/22 1850 9     Pain Loc --      Pain Edu? --      Excl. in [place]? --    No data found.  Updated Vital Signs BP (!) 128/96 (BP Location: Left Arm)   Pulse 74   Temp 98 F (36.7 C) (Oral)   Resp 16   SpO2 98%   Visual Acuity Right Eye Distance:   Left Eye Distance:   Bilateral Distance:    Right Eye Near:   Left Eye Near:    Bilateral Near:     Physical Exam Vitals and nursing note reviewed.  Constitutional:      General: He is not in acute distress.    Appearance: He is not ill-appearing.  Cardiovascular:     Rate and Rhythm: Normal rate and regular rhythm.  Musculoskeletal:        General: Tenderness present. No deformity.     Comments: Tenderness over the right olecranon process.  Full range of motion of the right elbow.  Patient has some pain with full range of motion of the right elbow.  Neurological:     Mental Status: He is alert.      UC Treatments / Results  Labs (all labs ordered are listed, but only abnormal results are displayed) Labs Reviewed - No data to display  EKG   Radiology No results found.  Procedures Procedures (including critical care time)  Medications Ordered in UC Medications - No data to display  Initial Impression / Assessment and Plan / UC Course  I have reviewed the triage vital signs  and the nursing notes.  Pertinent labs & imaging results that were available during my care of the patient were reviewed by me and considered in my medical decision making (see chart for details).     1.  Olecranon bursitis of the right elbow: Prednisone 20 mg orally daily for 5 days Compressive bandage Cold compress of the right elbow as needed Gentle range of motion  exercises Return precautions given. Final Clinical Impressions(s) / UC Diagnoses   Final diagnoses:  Olecranon bursitis of right elbow     Discharge Instructions      Apply a compressive ACE bandage.  Rest and elevate the affected painful area.   Apply cold compresses intermittently as needed.   As pain recedes, begin normal activities slowly as tolerated.   Please return to urgent care if symptoms worsen    ED Prescriptions     Medication Sig Dispense Auth. Provider   predniSONE (DELTASONE) 20 MG tablet Take 1 tablet (20 mg total) by mouth daily for 5 days. 5 tablet Matan Steen, Myrene Galas, MD      PDMP not reviewed this encounter.   Chase Picket, MD 12/02/22 1700

## 2022-12-03 ENCOUNTER — Telehealth (INDEPENDENT_AMBULATORY_CARE_PROVIDER_SITE_OTHER): Payer: Medicare HMO | Admitting: Psychiatry

## 2022-12-03 ENCOUNTER — Encounter: Payer: Self-pay | Admitting: Psychiatry

## 2022-12-03 DIAGNOSIS — R69 Illness, unspecified: Secondary | ICD-10-CM | POA: Diagnosis not present

## 2022-12-03 DIAGNOSIS — F4310 Post-traumatic stress disorder, unspecified: Secondary | ICD-10-CM | POA: Diagnosis not present

## 2022-12-03 DIAGNOSIS — F410 Panic disorder [episodic paroxysmal anxiety] without agoraphobia: Secondary | ICD-10-CM

## 2022-12-03 DIAGNOSIS — F1010 Alcohol abuse, uncomplicated: Secondary | ICD-10-CM | POA: Diagnosis not present

## 2022-12-03 DIAGNOSIS — F3132 Bipolar disorder, current episode depressed, moderate: Secondary | ICD-10-CM | POA: Diagnosis not present

## 2022-12-03 MED ORDER — ARIPIPRAZOLE 15 MG PO TABS: 15.0000 mg | ORAL_TABLET | Freq: Every day | ORAL | 0 refills | Status: DC

## 2022-12-03 MED ORDER — CLONAZEPAM 1 MG PO TABS: ORAL_TABLET | ORAL | 0 refills | Status: DC

## 2022-12-05 ENCOUNTER — Ambulatory Visit: Payer: Medicare HMO | Attending: Cardiology | Admitting: Pharmacist

## 2022-12-05 MED ORDER — MOUNJARO 2.5 MG/0.5ML ~~LOC~~ SOAJ: 2.5000 mg | SUBCUTANEOUS | 0 refills | Status: DC

## 2022-12-05 NOTE — Progress Notes (Unsigned)
Patient ID: [name]                 DOB: 1966-07-19                    MRN: [id number]     HPI: [name] is a 57 y.o. male patient referred to pharmacy clinic by Allena Katz, NP and [name] to initiate weight loss therapy with GLP1-RA. PMH is significant for NICM with EF <20% in Aug 2023, CKD, T2DM, afib, PE in 2015, HTN, anxiety, morbid obesity, OSA, and tobacco/alcohol use. Pt initially referred to PharmD Feb 2023 but wanted to postpone starting therapy due to ICD placement and COVID infection.  Pt with hx of T2DM and former A1Cs of 7.8% in 2013, 6.7% in 2017, and 7.3% in 2019. Has been back down in prediabetic range likely since taking Farxiga for his CHF. Most recent A1c 5.7%.   Reports he used to weigh ~465 lbs a few years ago, cut out sugar and dropped to ~350 lbs.  Mounjaro on his formulary, has both Medicare and Medicaid.  Baseline weight and BMI: 373 lbs, 58  Current meds that affect weight: Abilify - weight gain; bupropion - weight loss  Diet: Usually eats only once a day but eats whatever he wants at that time.  Exercise: Starting to walk more, got a new fitbit, averaging 1200 steps per day[facility]5 [facility] most days a week.   Family History: Mother with HTN and brain tumor, father with DM, maternal grandmother with heart disease, daughter with DM.  Social History: Former tobacco and alcohol use.  Labs: Lab Results  Component Value Date   HGBA1C 5.7 (H) 10/10/2022    Wt Readings from Last 1 Encounters:  11/28/22 (!) 373 lb (169.2 kg)    BP Readings from Last 1 Encounters:  12/01/22 (!) 128/96   Pulse Readings from Last 1 Encounters:  12/01/22 74       Component Value Date/Time   CHOL 177 04/11/2022 1237   TRIG 127 04/11/2022 1237   HDL 39 (L) 04/11/2022 1237   CHOLHDL 4.5 04/11/2022 1237   VLDL 25 04/11/2022 1237   LDLCALC 113 (H) 04/11/2022 1237   LDLDIRECT 114.0 03/21/2020 1455    Past Medical History:   Diagnosis Date   Alcohol abuse    Anxiety state, unspecified    Atrial fibrillation (HCC)    CHF (congestive heart failure) (HCC)    Chronic systolic heart failure (HCC)    Diabetes mellitus, type II (HCC)    Edema    Gout    History of medication noncompliance    Migraine    Obesity, unspecified    Obstructive sleep apnea    Psychiatric disorder    Pulmonary embolism (HCC)    Shortness of breath     Current Outpatient Medications on File Prior to Visit  Medication Sig Dispense Refill   albuterol (VENTOLIN HFA) 108 (90 Base) MCG/ACT inhaler INHALE 1-2 PUFFS INTO THE LUNGS EVERY 4 (FOUR) HOURS AS NEEDED FOR SHORTNESS OF BREATH OR WHEEZING. 8.5 each 1   amiodarone (PACERONE) 200 MG tablet Take 0.5 tablets (100 mg total) by mouth daily. Patient taking once daily 90 tablet 1   ARIPiprazole (ABILIFY) 15 MG tablet Take 1 tablet (15 mg total) by mouth daily. 90 tablet 0   atorvastatin (LIPITOR) 40 MG tablet Take 1 tablet (40 mg total) by mouth daily. 90 tablet 3   buPROPion (  WELLBUTRIN XL) 150 MG 24 hr tablet Take 1 tablet (150 mg total) by mouth daily. 30 tablet 1   carvedilol (COREG) 6.25 MG tablet Take 0.5 tablets (3.125 mg total) by mouth 2 (two) times daily. 60 tablet 3   clonazePAM (KLONOPIN) 1 MG tablet Take 0.5 tablets (0.5 mg total) by mouth daily. May also take 1 tablet (1 mg total) at bedtime as needed for anxiety. 45 tablet 0   diclofenac Sodium (VOLTAREN) 1 % GEL Apply 2 g topically 4 (four) times daily. 100 g 0   FARXIGA 10 MG TABS tablet TAKE 1 TABLET BY MOUTH EVERY DAY 90 tablet 1   losartan (COZAAR) 25 MG tablet Take 0.5 tablets (12.5 mg total) by mouth 2 (two) times daily. 90 tablet 3   pantoprazole (PROTONIX) 40 MG tablet Take 1 tablet (40 mg total) by mouth daily. 90 tablet 1   potassium chloride SA (KLOR-CON M) 20 MEQ tablet Take 2 tablets (40 mEq total) by mouth daily. 180 tablet 3   predniSONE (DELTASONE) 20 MG tablet Take 1 tablet (20 mg total) by mouth daily for 5  days. 5 tablet 0   PRESCRIPTION MEDICATION Inhale into the lungs See admin instructions. Bipap, pressure 16/12 with 2L of O2 - use whenever sleeping     sildenafil (VIAGRA) 50 MG tablet Take 1 tablet by mouth daily as needed for erectile dysfunction (begin with one tablet --may take 2 if necessary). 15 tablet 5   spironolactone (ALDACTONE) 25 MG tablet TAKE 1 TABLET (25 MG TOTAL) BY MOUTH DAILY. 90 tablet 1   torsemide (DEMADEX) 20 MG tablet Take '60mg'$  daily,alternating with 40 mg daily 180 tablet 3   XARELTO 20 MG TABS tablet TAKE 1 TABLET BY MOUTH DAILY WITH SUPPER. 90 tablet 1   [DISCONTINUED] pravastatin (PRAVACHOL) 40 MG tablet Take 40 mg by mouth daily.     No current facility-administered medications on file prior to visit.    Allergies  Allergen Reactions   Ace Inhibitors Anaphylaxis and Swelling    Angioedema   Bidil [Isosorb Dinitrate-Hydralazine] Other (See Comments)    headache   Digoxin And Related     Unspecified "side effects"   Buspirone Other (See Comments)    dizziness     Assessment/Plan:  1. T2DM + Obesity - Will start Mounjaro 2.'5mg'$  SQ once weekly x 4 weeks and follow pt via telephone for monthly subsequent dose titrations in 2.'5mg'$  monthly increments up to max weekly dose of '15mg'$  pending tolerability. A1c well controlled in prediabetic range on Farxiga for his CHF, CKD, and DM. Baseline BMI 56. Pt has Medicare and Medicaid insurance so copay should be low.  Advised patient on common side effects including nausea, diarrhea, dyspepsia, decreased appetite, and fatigue. Counseled patient on reducing meal size and how to titrate medication to minimize side effects. Counseled patient to call if intolerable side effects or if experiencing dehydration, abdominal pain, or dizziness. Patient will adhere to dietary modifications and will target at least 150 minutes of moderate intensity exercise weekly.  Follow up in 1 month via telephone for subsequent dose  titrations.  Ann Groeneveld E. Grettell Ransdell, PharmD, BCACP, [place]. [address] [place], [place] [address] Phone: [phone]; Fax: [phone] 12/05/2022 10:48 AM

## 2022-12-05 NOTE — Patient Instructions (Signed)
Mounjaro Counseling Points This medication reduces your appetite and may make you feel fuller longer.  Stop eating when your body tells you that you are full. This will likely happen sooner than you are used to. Fried/greasy food and sweets may upset your stomach - minimize these as much as possible. Store your medication in the fridge until you are ready to use it. Inject your medication in the fatty tissue of your lower abdominal area (2 inches away from belly button) or upper outer thigh. Rotate injection sites. Common side effects include: nausea, diarrhea/constipation, and heartburn, and are more likely to occur if you overeat. Stop your injection for 7 days prior to surgical procedures requiring anesthesia.  Dosing schedule: - Month 1: Inject Mounjaro 2.'5mg'$  subcutaneously once weekly for 4 weeks - We will increase your dose in 2.'5mg'$  increments each month as you tolerate it, up to a max weekly dose of '15mg'$   Tips for success: Write down the reasons why you want to lose weight and post it in a place where you'll see it often.  Start small and work your way up. Keep in mind that it takes time to achieve goals, and small steps add up.  Any additional movements help to burn calories. Taking the stairs rather than the elevator and parking at the far end of your parking lot are easy ways to start. Brisk walking for at least 30 minutes 4 or more days of the week is an excellent goal to work toward  Understanding what it means to feel full: Did you know that it can take 15 minutes or more for your brain to receive the message that you've eaten? That means that, if you eat less food, but consume it slower, you may still feel satisfied.  Eating a lot of fruits and vegetables can also help you feel fuller.  Eat off of smaller plates so that moderate portions don't seem too small  Tips for living a healthier life     Building a Healthy and Balanced Diet Make most of your meal vegetables and  fruits -  of your plate. Aim for color and variety, and remember that potatoes don't count as vegetables on the Healthy Eating Plate because of their negative impact on blood sugar.  Go for whole grains -  of your plate. Whole and intact grains--whole wheat, barley, wheat berries, quinoa, oats, brown rice, and foods made with them, such as whole wheat pasta--have a milder effect on blood sugar and insulin than white bread, white rice, and other refined grains.  Protein power -  of your plate. Fish, poultry, beans, and nuts are all healthy, versatile protein sources--they can be mixed into salads, and pair well with vegetables on a plate. Limit red meat, and avoid processed meats such as bacon and sausage.  Healthy plant oils - in moderation. Choose healthy vegetable oils like olive, canola, soy, corn, sunflower, peanut, and others, and avoid partially hydrogenated oils, which contain unhealthy trans fats. Remember that low-fat does not mean "healthy."  Drink water, coffee, or tea. Skip sugary drinks, limit milk and dairy products to one to two servings per day, and limit juice to a small glass per day.  Stay active. The red figure running across the [place] is a reminder that staying active is also important in weight control.  The main message of the Healthy Eating Plate is to focus on diet quality:  The type of carbohydrate in the diet is more important than the  amount of carbohydrate in the diet, because some sources of carbohydrate--like vegetables (other than potatoes), fruits, whole grains, and beans--are healthier than others. The Healthy Eating Plate also advises consumers to avoid sugary beverages, a major source of calories--usually with little nutritional value--in the American diet. The Healthy Eating Plate encourages consumers to use healthy oils, and it does not set a maximum on the percentage of calories people should get each day from healthy sources  of fat. In this way, the Healthy Eating Plate recommends the opposite of the low-fat message promoted for decades by the USDA.  [employer].no  SUGAR  Sugar is a huge problem in the modern day diet. Sugar is a big contributor to heart disease, diabetes, high triglyceride levels, fatty liver disease and obesity. Sugar is hidden in almost all packaged foods/beverages. Added sugar is extra sugar that is added beyond what is naturally found and has no nutritional benefit for your body. The American Heart Association recommends limiting added sugars to no more than 25g for women and 36 grams for men per day. There are many names for sugar including maltose, sucrose (names ending in "ose"), high fructose corn syrup, molasses, cane sugar, corn sweetener, raw sugar, syrup, honey or fruit juice concentrate.   One of the best ways to limit your added sugars is to stop drinking sweetened beverages such as soda, sweet tea, and fruit juice.  There is 65g of added sugars in one 20oz bottle of Coke! That is equal to 7.5 donuts.   Pay attention and read all nutrition facts labels. Below is an examples of a nutrition facts label. The #1 is showing you the total sugars where the # 2 is showing you the added sugars. This one serving has almost the max amount of added sugars per day!   EXERCISE  Exercise is good. We've all heard that. In an ideal world, we would all have time and resources to get plenty of it. When you are active, your heart pumps more efficiently and you will feel better.  Multiple studies show that even walking regularly has benefits that include living a longer life. The American Heart Association recommends 150 minutes per week of exercise (30 minutes per day most days of the week). You can do this in any increment you wish. Nine or more 10-minute walks count. So does an hour-long exercise class. Break the time apart into what will work in your  life. Some of the best things you can do include walking briskly, jogging, cycling or swimming laps. Not everyone is ready to "exercise." Sometimes we need to start with just getting active. Here are some easy ways to be more active throughout the day:  Take the stairs instead of the elevator  Go for a 10-15 minute walk during your lunch break (find a friend to make it more enjoyable)  When shopping, park at the back of the parking lot  If you take public transportation, get off one stop early and walk the extra distance  Pace around while making phone calls  Check with your doctor if you aren't sure what your limitations may be. Always remember to drink plenty of water when doing any type of exercise. Don't feel like a failure if you're not getting the 90-150 minutes per week. If you started by being a couch potato, then just a 10-minute walk each day is a huge improvement. Start with little victories and work your way up.   HEALTHY EATING TIPS  Plan ahead: make a menu of the meals for a week then create a grocery list to go with that menu. Consider meals that easily stretch into a night of leftovers, such as stews or casseroles. Or consider making two of your favorite meal and put one in the freezer for another night. Try a night or two each week that is "meatless" or "no cook" such as salads. When you get home from the grocery store wash and prepare your vegetables and fruits. Then when you need them they are ready to go.   Tips for going to the grocery store:  [place] store or generic brands  Check the weekly ad from your store on-line or in their in-store flyer  Look at the unit price on the shelf tag to compare/contrast the costs of different items  Buy fruits/vegetables in season  Carrots, bananas and apples are low-cost, naturally healthy items  If meats or frozen vegetables are on sale, buy some extras and put in your freezer  Limit buying prepared or "ready to eat" items, even  if they are pre-made salads or fruit snacks  Do not shop when you're hungry  Foods at eye level tend to be more expensive. Look on the high and low shelves for deals.  Consider shopping at the farmer's market for fresh foods in season.  Avoid the cookie and chip aisles (these are expensive, high in calories and low in nutritional value). Shop on the outside of the grocery store.  Healthy food preparations:  If you can't get lean hamburger, be sure to drain the fat when cooking  Steam, saut (in olive oil), grill or bake foods  Experiment with different seasonings to avoid adding salt to your foods. Kosher salt, sea salt and Himalayan salt are all still salt and should be avoided. Try seasoning food with onion, garlic, thyme, rosemary, basil ect. Onion powder or garlic powder is ok. Avoid if it says salt (ie garlic salt).

## 2022-12-07 ENCOUNTER — Other Ambulatory Visit: Payer: Self-pay | Admitting: Internal Medicine

## 2022-12-07 DIAGNOSIS — K219 Gastro-esophageal reflux disease without esophagitis: Secondary | ICD-10-CM

## 2022-12-22 ENCOUNTER — Encounter (INDEPENDENT_AMBULATORY_CARE_PROVIDER_SITE_OTHER): Payer: Medicare HMO | Admitting: Internal Medicine

## 2022-12-22 ENCOUNTER — Ambulatory Visit: Payer: Medicare HMO | Admitting: Dietician

## 2022-12-23 ENCOUNTER — Other Ambulatory Visit: Payer: Self-pay

## 2022-12-23 ENCOUNTER — Emergency Department (HOSPITAL_COMMUNITY)
Admission: EM | Admit: 2022-12-23 | Discharge: 2022-12-23 | Disposition: A | Discharge status: Home / Self Care | Payer: Medicare HMO | Attending: Emergency Medicine | Admitting: Emergency Medicine

## 2022-12-23 ENCOUNTER — Emergency Department (HOSPITAL_BASED_OUTPATIENT_CLINIC_OR_DEPARTMENT_OTHER): Payer: Medicare HMO

## 2022-12-23 DIAGNOSIS — I509 Heart failure, unspecified: Secondary | ICD-10-CM | POA: Diagnosis not present

## 2022-12-23 DIAGNOSIS — Z7901 Long term (current) use of anticoagulants: Secondary | ICD-10-CM | POA: Insufficient documentation

## 2022-12-23 DIAGNOSIS — Z79899 Other long term (current) drug therapy: Secondary | ICD-10-CM | POA: Diagnosis not present

## 2022-12-23 DIAGNOSIS — M79601 Pain in right arm: Secondary | ICD-10-CM

## 2022-12-23 DIAGNOSIS — M79609 Pain in unspecified limb: Secondary | ICD-10-CM | POA: Diagnosis not present

## 2022-12-23 DIAGNOSIS — E119 Type 2 diabetes mellitus without complications: Secondary | ICD-10-CM | POA: Diagnosis not present

## 2022-12-23 DIAGNOSIS — M7989 Other specified soft tissue disorders: Secondary | ICD-10-CM

## 2022-12-23 DIAGNOSIS — I110 Hypertensive heart disease with heart failure: Secondary | ICD-10-CM | POA: Insufficient documentation

## 2022-12-23 LAB — BASIC METABOLIC PANEL
Anion gap: 8 (ref 5–15)
BUN: 15 mg/dL (ref 6–20)
CO2: 23 mmol/L (ref 22–32)
Calcium: 8.9 mg/dL (ref 8.9–10.3)
Chloride: 106 mmol/L (ref 98–111)
Creatinine, Ser: 1.53 mg/dL — ABNORMAL HIGH (ref 0.61–1.24)
GFR, Estimated: 54 mL/min — ABNORMAL LOW (ref >60)
Glucose, Bld: 91 mg/dL (ref 70–99)
Potassium: 3.9 mmol/L (ref 3.5–5.1)
Sodium: 137 mmol/L (ref 135–145)

## 2022-12-23 LAB — CBC WITH DIFFERENTIAL/PLATELET
Abs Immature Granulocytes: 0.01 10*3/uL (ref 0.00–0.07)
Basophils Absolute: 0 10*3/uL (ref 0.0–0.1)
Basophils Relative: 1 %
Eosinophils Absolute: 0.1 10*3/uL (ref 0.0–0.5)
Eosinophils Relative: 2 %
HCT: 44.2 % (ref 39.0–52.0)
Hemoglobin: 15.7 g/dL (ref 13.0–17.0)
Immature Granulocytes: 0 %
Lymphocytes Relative: 38 %
Lymphs Abs: 2.9 10*3/uL (ref 0.7–4.0)
MCH: 29 pg (ref 26.0–34.0)
MCHC: 35.5 g/dL (ref 30.0–36.0)
MCV: 81.7 fL (ref 80.0–100.0)
Monocytes Absolute: 0.7 10*3/uL (ref 0.1–1.0)
Monocytes Relative: 10 %
Neutro Abs: 3.7 10*3/uL (ref 1.7–7.7)
Neutrophils Relative %: 49 %
Platelets: 255 10*3/uL (ref 150–400)
RBC: 5.41 MIL/uL (ref 4.22–5.81)
RDW: 14.7 % (ref 11.5–15.5)
WBC: 7.5 10*3/uL (ref 4.0–10.5)
nRBC: 0 % (ref 0.0–0.2)

## 2022-12-23 MED ORDER — KETOROLAC TROMETHAMINE 15 MG/ML IJ SOLN
15.0000 mg | Freq: Once | INTRAMUSCULAR | Status: AC
  Administered 2022-12-23: 15 mg via INTRAMUSCULAR
  Filled 2022-12-23: qty 1

## 2022-12-23 NOTE — Discharge Instructions (Addendum)
Keep your appointment with your PCP.  I would also like you to follow-up with orthopedics/sports medicine.  There is a name attached to these discharge papers that you may see.  Continue with outpatient Tylenol, ibuprofen, Voltaren gel and ice.

## 2022-12-23 NOTE — ED Provider Notes (Signed)
[facility] Provider Note   CSN: [id number] Arrival date & time: 12/23/22  1051     History  Chief Complaint  Patient presents with   Arm Pain    [name] is a 57 y.o. male with a past medical history of hypertension, paroxysmal A-fib, CHF and type 2 diabetes presenting today with arm pain.  He reports has been going on for around a month.  Has seen PCP who gave him topical gel as well as urgent care that gave him steroids 3 weeks ago.  He says that the steroids helped however he was only given 5 days worth of these.  Says that it is worse in the morning but improves throughout the day.  No other joint discomfort.  Says that his diabetes is well-controlled and denies any IVDU.  Patient does not work and denies lifting or moving heavy objects.  No history of gout   Arm Pain       Home Medications Prior to Admission medications   Medication Sig Start Date End Date Taking? Authorizing Provider  albuterol (VENTOLIN HFA) 108 (90 Base) MCG/ACT inhaler INHALE 1-2 PUFFS INTO THE LUNGS EVERY 4 (FOUR) HOURS AS NEEDED FOR SHORTNESS OF BREATH OR WHEEZING. 06/04/22   [name], MD  amiodarone (PACERONE) 200 MG tablet Take 0.5 tablets (100 mg total) by mouth daily. Patient taking once daily 10/10/22   [name], MD  ARIPiprazole (ABILIFY) 15 MG tablet Take 1 tablet (15 mg total) by mouth daily. 12/03/22 03/03/23  [name], MD  atorvastatin (LIPITOR) 40 MG tablet Take 1 tablet (40 mg total) by mouth daily. 08/20/22   Rafael Bihari, FNP  buPROPion (WELLBUTRIN XL) 150 MG 24 hr tablet Take 1 tablet (150 mg total) by mouth daily. 09/10/22 11/09/22  [name], MD  carvedilol (COREG) 6.25 MG tablet Take 0.5 tablets (3.125 mg total) by mouth 2 (two) times daily. 10/10/22   [name], MD  clonazePAM (KLONOPIN) 1 MG tablet Take 0.5 tablets (0.5 mg total) by mouth daily. May also take 1 tablet (1 mg total) at bedtime as needed for  anxiety. 12/03/22 01/02/23  [name], MD  diclofenac Sodium (VOLTAREN) 1 % GEL Apply 2 g topically 4 (four) times daily. 11/28/22   Henson, Vickie L, NP-C  FARXIGA 10 MG TABS tablet TAKE 1 TABLET BY MOUTH EVERY DAY 07/02/22   [name], MD  losartan (COZAAR) 25 MG tablet Take 0.5 tablets (12.5 mg total) by mouth 2 (two) times daily. 07/23/22   [name], MD  pantoprazole (PROTONIX) 40 MG tablet TAKE 1 TABLET BY MOUTH EVERY DAY 12/07/22   [name], MD  potassium chloride SA (KLOR-CON M) 20 MEQ tablet Take 2 tablets (40 mEq total) by mouth daily. 08/21/22   [place], Maricela Bo, FNP  PRESCRIPTION MEDICATION Inhale into the lungs See admin instructions. Bipap, pressure 16/12 with 2L of O2 - use whenever sleeping    [provider]  sildenafil (VIAGRA) 50 MG tablet Take 1 tablet by mouth daily as needed for erectile dysfunction (begin with one tablet --may take 2 if necessary). 06/05/22   [name], MD  spironolactone (ALDACTONE) 25 MG tablet TAKE 1 TABLET (25 MG TOTAL) BY MOUTH DAILY. 03/31/22   [name], MD  tirzepatide [facility]) 2.5 MG/0.5ML Pen Inject 2.5 mg into the skin once a week. 12/05/22   [name], MD  torsemide (DEMADEX) 20 MG tablet  Take '60mg'$  daily,alternating with 40 mg daily 07/23/22   [name], MD  XARELTO 20 MG TABS tablet TAKE 1 TABLET BY MOUTH DAILY WITH SUPPER. 10/07/22   [name], MD  pravastatin (PRAVACHOL) 40 MG tablet Take 40 mg by mouth daily.  02/20/12  [provider]      Allergies    Ace inhibitors, Bidil [isosorb dinitrate-hydralazine], Digoxin and related, and Buspirone    Review of Systems   Review of Systems  Physical Exam Updated Vital Signs BP 103/79 (BP Location: Left Wrist)   Pulse 73   Temp 97.9 F (36.6 C) (Oral)   Resp 12   Ht '5\' 8"'$  (1.727 m)   Wt (!) 167.8 kg   SpO2 100%   BMI 56.26 kg/m  Physical Exam Vitals and nursing note reviewed.  Constitutional:      Appearance: Normal  appearance.  HENT:     Head: Normocephalic and atraumatic.  Eyes:     General: No scleral icterus.    Conjunctiva/sclera: Conjunctivae normal.  Pulmonary:     Effort: Pulmonary effort is normal. No respiratory distress.  Musculoskeletal:     Comments: Full range of motion of right upper extremity.  Strong radial pulse.  Normal finger grip.  Normal sensation distally.  No deformities.  No warmth or appreciable swelling to the upper arm.  Normal overlying skin  Skin:    Findings: No rash.  Neurological:     Mental Status: He is alert.  Psychiatric:        Mood and Affect: Mood normal.     ED Results / Procedures / Treatments   Labs (all labs ordered are listed, but only abnormal results are displayed) Labs Reviewed  BASIC METABOLIC PANEL - Abnormal; Notable for the following components:      Result Value   Creatinine, Ser 1.53 (*)    GFR, Estimated 54 (*)    All other components within normal limits  CBC WITH DIFFERENTIAL/PLATELET    EKG None  Radiology UE VENOUS DUPLEX (7am - 7pm)  Result Date: 12/23/2022 UPPER VENOUS STUDY  Patient Name:  [name]  Date of Exam:   12/23/2022 Medical Rec #: [id number]       Accession #:    [id number] Date of Birth: 06-Oct-1966      Patient Gender: M Patient Age:   28 years Exam Location:  [facility] Procedure:      VAS [place] UPPER EXTREMITY VENOUS DUPLEX Referring Phys: [place] --------------------------------------------------------------------------------  Indications: Right arm pain x1 month with focal pain at elbow and swelling into forearm Comparison Study: No prior studies. Performing Technologist: Darlin Coco RDMS, RVT  Examination Guidelines: A complete evaluation includes B-mode imaging, spectral Doppler, color Doppler, and power Doppler as needed of all accessible portions of each vessel. Bilateral testing is considered an integral part of a complete examination. Limited examinations for reoccurring indications may be  performed as noted.  Right Findings: +----------+------------+---------+-----------+----------+-------+ RIGHT     CompressiblePhasicitySpontaneousPropertiesSummary +----------+------------+---------+-----------+----------+-------+ IJV           Full       Yes       Yes                      +----------+------------+---------+-----------+----------+-------+ Subclavian               Yes       Yes                      +----------+------------+---------+-----------+----------+-------+  Axillary      Full       Yes       Yes                      +----------+------------+---------+-----------+----------+-------+ Brachial      Full                                          +----------+------------+---------+-----------+----------+-------+ Radial        Full                                          +----------+------------+---------+-----------+----------+-------+ Ulnar         Full                                          +----------+------------+---------+-----------+----------+-------+ Cephalic      Full                                          +----------+------------+---------+-----------+----------+-------+ Basilic       Full                                          +----------+------------+---------+-----------+----------+-------+  Left Findings: +----------+------------+---------+-----------+----------+-------+ LEFT      CompressiblePhasicitySpontaneousPropertiesSummary +----------+------------+---------+-----------+----------+-------+ Subclavian               Yes       Yes                      +----------+------------+---------+-----------+----------+-------+  Summary:  Right: No evidence of deep vein thrombosis in the upper extremity. No evidence of superficial vein thrombosis in the upper extremity.  Left: No evidence of thrombosis in the subclavian.  *See table(s) above for measurements and observations.    Preliminary      Procedures Procedures   Medications Ordered in ED Medications - No data to display  ED Course/ Medical Decision Making/ A&P                             Medical Decision Making Risk Prescription drug management.   57 year old presenting with right elbow pain. No other joint pain. Differential includes but is not limited to osteoarthritis, rheumatoid arthritis, gout/pseudogout, AVN, RA, bursitis, septic joint, gonococcal arthritis   This is not an exhaustive differential.    Past Medical History / Co-morbidities / Social History: Obesity, type 2 diabetes, hypertension, hypercholesterolemia   Additional history: Per chart review, patient was seen by urgent care 3 weeks ago and diagnosed with olecranon bursitis.  At that time he was given 5 days worth of prednisone.  3 days before this urgent care visit patient also was seen by PCP.  Had a negative x-ray with them.  He was started on Tylenol, Voltaren gel and NSAIDs  Physical Exam: Pertinent physical exam findings include Normal range of motion of the right elbow.  TTP around the olecranon.  No warmth or appreciable swelling.  No step-offs or crepitus.  Strong radial pulse, normal finger grip and sensation intact.  Lab Tests: I ordered, and personally interpreted labs.  The pertinent results include: No acute findings   Imaging Studies: Arm ultrasound ordered in triage.  I viewed and interpreted this and agree that there are no acute findings.   Medications: I ordered medication including toradol.    MDM/Disposition: This is a 57 year old male who presented today with right elbow pain.  Atraumatic.  Has been seen by PCP and urgent care.  Reports that steroids helped him but the pain has come back.  Patient is very sedentary and does not work, doubt overuse injury.  No risk factors for gonococcal arthritis.  Patient does have a history of diabetes but no history of sickle cell, low suspicion AVN.  Patient's physical exam is  inconsistent with gout.  No signs of septic joint, does not require an arthrocentesis.  I did viewed patient's x-ray done a month ago with PCP and there were no abnormalities.  Ultrasound was ordered in triage and there were no signs of DVT today.  At this time I do not believe patient has an emergent cause of elbow pain that requires further evaluation in the emergency department today.  He was treated with IM Toradol.  Will not treat with steroids due to his type 2 diabetes.  He was given follow-up with PCP later this week and I will give him a referral to sports medicine as well.  He is agreeable to the plan.     I discussed this case with my attending physician Dr. Doren Custard who cosigned this note including patient's presenting symptoms, physical exam, and planned diagnostics and interventions. Attending physician stated agreement with plan or made changes to plan which were implemented.      Final Clinical Impression(s) / ED Diagnoses Final diagnoses:  Right arm pain    Rx / DC Orders ED Discharge Orders     None      Results and diagnoses were explained to the patient. Return precautions discussed in full. Patient had no additional questions and expressed complete understanding.   This chart was dictated using voice recognition software.  Despite best efforts to proofread,  errors can occur which can change the documentation meaning.    Darliss Ridgel 12/23/22 1657    Godfrey Pick, MD 12/24/22 [phone]

## 2022-12-23 NOTE — ED Triage Notes (Signed)
Right arm swelling x 1 month. Pt states Swelling starts at elbow and is going down to right wrist. Pt was at PCP 3 weeks ago and was given a topical cream and then a week later went to Urgent care and was given a week of steroids. Pt denies any improvement and states pain and swelling is worsening. Denies ever having any injury.

## 2022-12-23 NOTE — ED Notes (Signed)
Pt ambulatory from bathroom, pt c/o swelling and pain to his R elbow, states that the pain shoots down his arm, states that it hurts to move his amr.  Pt has strong radial pulse.

## 2022-12-23 NOTE — ED Provider Triage Note (Addendum)
Emergency Medicine Provider Triage Evaluation Note  [name] , a 57 y.o. male  was evaluated in triage.  Pt complains of right arm swelling x 1 month.  Denies recent injury, trauma, fall prior to onset of his symptoms.  Was evaluated by his primary care provider and given Voltaren gel.  Was evaluated at urgent care as well and given a week of steroids.  Notes that his symptoms slightly improved with the steroids.  Patient is currently on Xarelto due to atrial fibrillation.  Review of Systems  Positive:  Negative:  Physical Exam  BP 100/74 (BP Location: Left Arm)   Pulse 73   Temp 97.8 F (36.6 C) (Oral)   Resp 19   Ht '5\' 8"'$  (1.727 m)   Wt (!) 167.8 kg   SpO2 99%   BMI 56.26 kg/m  Gen:   Awake, no distress   Resp:  Normal effort  MSK:   Moves extremities without difficulty  Other:  Tenderness to palpation noted to medial epicondyle of right arm.  Mild swelling noted to the area.  No overlying erythema noted.  No tenderness to palpation noted to right forearm.  Tenderness to palpation noted with supination and pronation of the right arm.  Medical Decision Making  Medically screening exam initiated at 12:15 PM.  Appropriate orders placed.  [name] was informed that the remainder of the evaluation will be completed by another provider, this initial triage assessment does not replace that evaluation, and the importance of remaining in the ED until their evaluation is complete.  Workup initiated   Helana Macbride A, PA-C 12/23/22 1222   2:45 PM -notified that patient has DVT negative   Callin Ashe A, PA-C 12/23/22 1445

## 2022-12-23 NOTE — Progress Notes (Signed)
Upper extremity venous right study completed.  Preliminary results relayed to [place], [place].   See CV Proc for preliminary results report.   Darlin Coco, RDMS, RVT

## 2022-12-26 ENCOUNTER — Telehealth: Payer: Self-pay | Admitting: Pharmacist

## 2022-12-26 MED ORDER — MOUNJARO 5 MG/0.5ML ~~LOC~~ SOAJ: 5.0000 mg | SUBCUTANEOUS | 0 refills | Status: DC

## 2022-12-26 NOTE — Telephone Encounter (Signed)
Called pt to follow up with Mounjaro tolerability and dose increase. Reports tolerating well, no GI side effects. Has been dealing with arm pain recently. Will increase dose to '5mg'$  weekly for the next month. I'll call pt in another month for continued dose titration.

## 2022-12-27 DIAGNOSIS — G4733 Obstructive sleep apnea (adult) (pediatric): Secondary | ICD-10-CM | POA: Diagnosis not present

## 2022-12-29 ENCOUNTER — Ambulatory Visit: Payer: Medicare HMO | Attending: Cardiology

## 2022-12-29 ENCOUNTER — Other Ambulatory Visit (HOSPITAL_COMMUNITY): Payer: Self-pay | Admitting: Cardiology

## 2022-12-29 DIAGNOSIS — Z95810 Presence of automatic (implantable) cardiac defibrillator: Secondary | ICD-10-CM

## 2022-12-29 DIAGNOSIS — I5022 Chronic systolic (congestive) heart failure: Secondary | ICD-10-CM

## 2022-12-30 ENCOUNTER — Other Ambulatory Visit (HOSPITAL_COMMUNITY): Payer: Self-pay | Admitting: Cardiology

## 2022-12-30 MED ORDER — CARVEDILOL 6.25 MG PO TABS: 6.2500 mg | ORAL_TABLET | Freq: Two times a day (BID) | ORAL | 11 refills | Status: DC
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
## 2022-12-31 ENCOUNTER — Telehealth: Payer: Self-pay

## 2022-12-31 ENCOUNTER — Ambulatory Visit: Payer: Medicare HMO | Admitting: Internal Medicine

## 2022-12-31 ENCOUNTER — Encounter: Payer: Self-pay | Admitting: Psychiatry

## 2022-12-31 ENCOUNTER — Telehealth (INDEPENDENT_AMBULATORY_CARE_PROVIDER_SITE_OTHER): Payer: Medicare HMO | Admitting: Psychiatry

## 2022-12-31 DIAGNOSIS — F4310 Post-traumatic stress disorder, unspecified: Secondary | ICD-10-CM

## 2022-12-31 DIAGNOSIS — F3132 Bipolar disorder, current episode depressed, moderate: Secondary | ICD-10-CM

## 2022-12-31 DIAGNOSIS — F410 Panic disorder [episodic paroxysmal anxiety] without agoraphobia: Secondary | ICD-10-CM | POA: Diagnosis not present

## 2022-12-31 DIAGNOSIS — R69 Illness, unspecified: Secondary | ICD-10-CM | POA: Diagnosis not present

## 2022-12-31 MED ORDER — CLONAZEPAM 1 MG PO TABS: ORAL_TABLET | ORAL | 1 refills | Status: DC

## 2022-12-31 MED ORDER — MIRTAZAPINE 7.5 MG PO TABS: 7.5000 mg | ORAL_TABLET | Freq: Every day | ORAL | 1 refills | Status: DC

## 2022-12-31 NOTE — Progress Notes (Signed)
EPIC Encounter for ICM Monitoring  Patient Name: [name] is a 57 y.o. male Date: 12/31/2022 Primary Care Physican: [name], MD Primary Cardiologist: Aundra Dubin Electrophysiologist: Marisa Sprinkles Pacing: 99% 06/06/2022 Weight: 360 lbs    06/16/2022 Weight: 360 lbs     06/20/2022 Weight: 356 lbs      08/13/2022 Weight: 372 lbs  10/22/2022 Weight: 372 lbs   11/25/2022 Weight: 365 lbs                                     Attempted call to patient and unable to reach.  Transmission reviewed.    CorVue thoracic impedance suggesting intermittent days between 1/10-1/18 with possible fluid accumulation.    Prescribed:  Torsemide 20 mg Take 3 tablets (60 mg total) by mouth alternating 2 tablets (40 mg total) every other day. Potassium 20 mEq take 1 tablet(s) (20 mEq total) by mouth. Spironolactone 25 mg take 1 tablet daily   Labs: 10/10/2022 Creatinine 1.74, BUN 16, Potassium 4.1, Sodium 140, GFR 46 09/25/2022 Creatinine 1.72, BUN 22, Potassium 3.8, Sodium 139, GFR 44.67  08/20/2022 Creatinine 1.53, BUN 17, Potassium 3.4, Sodium 138, GFR 54  07/31/2022 Creatinine 1.49, BUN 21, Potassium 3.4, Sodium 141, GFR 56 07/23/2022 Creatinine 1.46, BUN 15, Potassium 3.0, Sodium 141, GFR 57 05/30/2022 Creatinine 1.46, BUN 17, Potassium 3.5, Sodium 139, GFR 57 A complete set of results can be found in Results Review.   Recommendations:  Unable to reach.     Follow-up plan: ICM clinic phone appointment 02/02/2023.   91 day device clinic remote transmission 01/27/2023.     EP/Cardiology Office Visits: 01/09/2023 with HF clinic.   02/10/2023 with Dr Quentin Ore.   Copy of ICM check sent to Dr. Quentin Ore.    3 month ICM trend: 12/29/2022.    12-14 Month ICM trend:     Rosalene Billings, RN 12/31/2022 2:39 PM

## 2022-12-31 NOTE — Telephone Encounter (Signed)
Remote ICM transmission received.  Attempted call to patient regarding ICM remote transmission and no answer.  

## 2023-01-01 ENCOUNTER — Telehealth: Payer: Self-pay | Admitting: *Deleted

## 2023-01-01 NOTE — Telephone Encounter (Signed)
     Patient  visit on 12/23/2022  at [facility]  was for treatment   Have you been able to follow up with your primary care physician? Cannot get an appt 01/12/2023 with the PA beccause Other specialist not available until later in month . Advised to call PCP or go to Urgent care if he has issues prior to the appt date  The patient was able to obtain any needed medicine or equipment.  Are there diet recommendations that you are having difficulty following? NA Patient expresses understanding of discharge instructions and education provided has no other needs at this time. Yes  [facility] [phone] [address] , [place] [address] Email : [email or web address]

## 2023-01-06 NOTE — Progress Notes (Signed)
Advanced Heart Failure Clinic Note   Primary Care: Dr. Scarlette Calico HF Cardiologist: Dr. Aundra Dubin   HPI: Mr. Culleton is a 57 y.o. male with a past medical history of NICM, EF 25-30% in March 2018, felt to be related to prior ETOH abuse. He also has a history of PE 03/2014 completed a years course of Xarelto, morbid obesity, OSA, and tobacco abuse.    He was admitted 11/12/15-11/14/15 with acute on chronic systolic CHF and palpitations. He wore a 30 day event monitor at discharge as he had frequent PVC's and questionable Afib on telemetry. Also with some NSVT, so he was started on Amiodaone, but at follow up had not started taking it. He had previously refused ICD and was seen inpatient by Dr. Rayann Heman who felt that his morbid obesity was a prohibitive factor.    He was seen in the clinic in April 2018. He had started drinking ETOH again. Volume status was stable, he is not an Entresto candidate due to angioedema with lisinopril. Weight was 411 pounds.    Admitted 04/12/17 with SOB, chest pain. D- dimer was 1.16, chest CT without central obstructing PE, however more peripheral and subsegmental pulmonary artery branches were not confidently evaluated due to his body habitus. He was started on a heparin gtt for presumed PE, however VQ scan showed no PE. Troponin was elevated, peaked at 3.37. LHC showed no CAD. Echo showed an EF of 15%, grade 2 DD, no pericarditis. He was diuresed with IV lasix, and started on torsemide 81m at discharge. Discharge weight was 405 pounds.   Admitted 5/22 through 04/24/2018 with abdominal pain, nausea, and vomiting. 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 Did not require surgical intervention. He will have follow up with GI.   Echo in 2/21 with EF 20-25%, severe LV dilation.   Presented 05/09/21 w/ SOB 2/2 acute CHF w ? PNA.  CTA showed multifocal ?PNA, no PE. Also in Afib w/ RVR in 150s on admit. Initially required bipap, developed hemoptysis and intubated. Treated w/ IV Lasix. Echo  05/10/21: LVEF < 20%. RV moderately reduced. Improved and was extubated 6/13. Went into Afib w/ RVR. Refused to wear BiPAP. Developed severe agitation/hypoxia and re-intubated.  Went into VT>>VF arrest overnight ACLS>>ROSC after 152m. Developed refractory AFib again next morning requiring emergent cardioversion 05/16/21. He was transitioned to PO amiodarone. HR remained in 40-50's, metoprolol and sildenafil were stopped. Discharge weight 367 lbs.  St Jude CRT-D device placed in 2/23.  Echo 8/23 showed EF <20%, mild MR, RV normal.  CPX in 9/23 showed moderate functional limitation due to HF and body habitus.   Follow up 11/23,  NYHA II and volume stable. Remained in NSR and amiodarone decreased to 100 mg daily.   Today he returns for HF follow up. Overall feeling fine. [place] no [place]B walking on flat ground. Denies abnormal bleeding, palpitations, CP, dizziness, edema, or PND/Orthopnea. Appetite ok. No fever or chills. Weight at home 377 pounds. Taking all medications but ran out of losartan. Wears BiPap, started back smoking 3 cigs/day, rare ETOH. Took 5 sildenafil tablets with no result.  St Jude device interrogation: 98% BiV pacing, no AF/VT, CorVue stable   ECG (personally reviewed): none ordered today.  Labs (1/19): K 3.8 Creatinine 1.25  Labs (2/19): K 3.8 Creatinine 1.08 Labs (4/19): K 4.1 creatinine 1/15 BNP 212  Labs (5/19): creatinine 1.1  Labs (10/19): LDL 166, K 3.8, creatinine 1.01, AST 102 => 25, ALT 125 => 37,  alkaline phosphatase 233 Labs (11/20): LDL 106, TGs 232 Labs (2/21): K 3.3, creatinine 1.3 Labs (4/21): LDL 114, TGs 466, K 3.6, creatinine 1.22 Labs (12/21): K 3.5, creatinine 1.08, LDL 164, TGs 133 Labs (722): K 3.8, creatinine 1.4 Labs (8/22): K 3.8, creatinine 1.23 Labs (9/22): K 3.6, creatinine 1.26 Labs (2/23): LFTs normal, TSH normal, K 4, creatinine 1.3 Labs (8/23): K 3.4, creatinine 1.49 Labs (10/23): K 3.8, creatinine  1.72 Labs (11/23): LFTs normal, TSH normal Labs (1/24): K 3.9, creatinine 1.53  PMH: 1. Nonischemic cardiomyopathy: Prior cath with no significant CAD.  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  Prior echoes with EF as low as 25%.  Echo (9/13) with EF 35-40%, moderate to severe LV dilation, diffuse hypokinesis, mild MR. Echo (5/15) with EF 30-35%, moderate to severe LAE, normal RV size and systolic function.  Angioedema with ACEI, headaches with hydralazine/nitrates. Echo (3/16) with EF 25-30%, severe LV dilation, normal RV size and systolic function.  [facility] 08/13/15 showed no significant coronary disease; RA mean 6, PA 33/11 mean 23, PCWP mean 13, Fick CO/CI 4.75 /1.68 (difficult study, radial artery spasm, if needs future cath would use groin).  Echo (9/16) showed EF 20-25%.   - Echo (3/18): EF 25-30%, moderate LAE - Echo 4/18 EF 15% - Echo 7/19 EF 20-25% - Echo 2/21 with EF 20-25%, severe LV dilation.  - Echo 6/22 with EF < 20%, normal RV. - St Jude CRT-D 2/23.  - Echo (8/23): EF <20%, mild MR, RV normal.  - CPX (9/23): Peak VO2 13.1, VE/VCO2 slope 37, RER 1.10.  Moderate functional limitation, body habitus and HF.  2. HTN: angioedema with ACEI.  3. OSA: on Bipap 4. Morbid obesity 5. Paroxysmal atrial fibrillation: Urgent DCCV 6/22. 6. Smoker.  7. Anxiety/panic attacks 8. PE: 5/15, diagnosed by V/Q scan. CTA chest 8/16 negative for PE.  9. NSVT, PVCs: 30 day monitor (12/16) with PVCs, PACs, no atrial fibrillation.  - Zio patch (9/19): few short NSVT runs, no atrial fibrillation, 1.1% PVCs 10. Hematuria: Apparently had negative workup by urology.  11. ABIs (6/16) were normal 12. 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  13. Gout 14. Low back pain.  15. 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 CKD stage 3 17. Type 2 diabetes   SH: Separated, lives in [place], drinks ETOH occasionally, no drugs. Prior smoker.  Has son and daughter.     FH: No premature CAD.     Review of systems complete and found to be negative unless listed in HPI.   Current Outpatient Medications  Medication Sig Dispense Refill   albuterol (VENTOLIN HFA) 108 (90 Base) MCG/ACT inhaler INHALE 1-2 PUFFS INTO THE LUNGS EVERY 4 (FOUR) HOURS AS NEEDED FOR SHORTNESS OF BREATH OR WHEEZING. 8.5 each 1   amiodarone (PACERONE) 200 MG tablet Take 0.5 tablets (100 mg total) by mouth daily. Patient taking once daily 90 tablet 1   ARIPiprazole (ABILIFY) 15 MG tablet Take 1 tablet (15 mg total) by mouth daily. 90 tablet 0   atorvastatin (LIPITOR) 40 MG tablet Take 1 tablet (40 mg total) by mouth daily. 90 tablet 3   buPROPion (WELLBUTRIN XL) 150 MG 24 hr tablet Take 1 tablet (150 mg total) by mouth daily. 30 tablet 1   carvedilol (COREG) 6.25 MG tablet Take 1 tablet (6.25 mg total) by mouth 2 (two) times daily. 60 tablet 11   clonazePAM (KLONOPIN) 1 MG tablet Take 0.5 tablets (0.5 mg total) by mouth daily.  May also take 1 tablet (1 mg total) at bedtime as needed for anxiety. 45 tablet 1   diclofenac Sodium (VOLTAREN) 1 % GEL Apply 2 g topically 4 (four) times daily. 100 g 0   FARXIGA 10 MG TABS tablet TAKE 1 TABLET BY MOUTH EVERY DAY 90 tablet 1   mirtazapine (REMERON) 7.5 MG tablet Take 1 tablet (7.5 mg total) by mouth at bedtime. 30 tablet 1   pantoprazole (PROTONIX) 40 MG tablet TAKE 1 TABLET BY MOUTH EVERY DAY 90 tablet 0   potassium chloride SA (KLOR-CON M) 20 MEQ tablet Take 2 tablets (40 mEq total) by mouth daily. 180 tablet 3   PRESCRIPTION MEDICATION Inhale into the lungs See admin instructions. Bipap, pressure 16/12 with 2L of O2 - use whenever sleeping     sildenafil (REVATIO) 20 MG tablet TAKE 1 TABLET BY MOUTH AS NEEDED, MAY take UP TO FOUR TABLETS AS NEEDED 20 tablet 0   sildenafil (VIAGRA) 50 MG tablet Take 1 tablet by mouth daily as needed for erectile dysfunction (begin with one tablet --may take 2 if necessary). 15 tablet 5   tirzepatide (MOUNJARO) 5 MG/0.5ML Pen Inject 5 mg  into the skin once a week. 2 mL 0   torsemide (DEMADEX) 20 MG tablet Take 59m daily,alternating with 40 mg daily 180 tablet 3   XARELTO 20 MG TABS tablet TAKE 1 TABLET BY MOUTH DAILY WITH SUPPER. 90 tablet 1   spironolactone (ALDACTONE) 25 MG tablet Take 1 tablet (25 mg total) by mouth at bedtime. 90 tablet 2   No current facility-administered medications for this encounter.   Allergies  Allergen Reactions   Ace Inhibitors Anaphylaxis and Swelling    Angioedema   Bidil [Isosorb Dinitrate-Hydralazine] Other (See Comments)    headache   Digoxin And Related     Unspecified "side effects"   Buspirone Other (See Comments)    dizziness   Social History   Socioeconomic History   Marital status: Married    Spouse name: Not on file   Number of children: 3   Years of education: Not on file   Highest education level: Not on file  Occupational History   Occupation: DISABLED  Tobacco Use   Smoking status: Some Days    Packs/day: 0.50    Years: 30.00    Total pack years: 15.00    Types: Cigarettes    Last attempt to quit: 05/15/2020    Years since quitting: 2.6   Smokeless tobacco: Never   Tobacco comments:    vaping - nicotine-free products  Vaping Use   Vaping Use: Never used  Substance and Sexual Activity   Alcohol use: Not Currently    Alcohol/week: 0.0 standard drinks of alcohol   Drug use: No   Sexual activity: Not Currently  Other Topics Concern   Not on file  Social History Narrative   He smokes about a pack per day and he has been smoking since he was 57years of age.  He drinks alcohol occasionally, but he denies any illicit drug abuse.  He is presently on disability.    Lives with wife in a 2 story home.  Has 2 children.   Previously worked in [employer] last worked in 1998.   Highest level of education:  11th grade           Social Determinants of Health   Financial Resource Strain: Low Risk  (08/26/2022)   Overall Financial Resource Strain (CARDIA)     Difficulty of  Paying Living Expenses: Not very hard  Food Insecurity: No Food Insecurity (08/26/2022)   Hunger Vital Sign    Worried About Running Out of Food in the Last Year: Never true    Ran Out of Food in the Last Year: Never true  Transportation Needs: No Transportation Needs (08/26/2022)   PRAPARE - Hydrologist (Medical): No    Lack of Transportation (Non-Medical): No  Physical Activity: Sufficiently Active (08/26/2022)   Exercise Vital Sign    Days of Exercise per Week: 5 days    Minutes of Exercise per Session: 30 min  Stress: No Stress Concern Present (08/26/2022)   [place]    Feeling of Stress : Only a little  Social Connections: Socially Isolated (08/26/2022)   Social Connection and Isolation Panel [NHANES]    Frequency of Communication with Friends and Family: Once a week    Frequency of Social Gatherings with Friends and Family: Never    Attends Religious Services: Never    Marine scientist or Organizations: No    Attends Archivist Meetings: Never    Marital Status: Separated  Intimate Partner Violence: Not At Risk (08/26/2022)   Humiliation, Afraid, Rape, and Kick questionnaire    Fear of Current or Ex-Partner: No    Emotionally Abused: No    Physically Abused: No    Sexually Abused: No   Family History  Problem Relation Age of Onset   Cancer Mother        brain tumor   Hypertension Mother    Diabetes Father        Deceased, 32   Heart disease Maternal Grandmother    Hypertension Other        Family History   Stroke Other        Family History   Diabetes Other        Family History   Diabetes Daughter    BP (!) 120/94   Pulse 75   Ht 5' 8.5" (1.74 m)   Wt (!) 166.7 kg (367 lb 9.6 oz)   SpO2 99%   BMI 55.08 kg/m   Wt Readings from Last 3 Encounters:  01/09/23 (!) 166.7 kg (367 lb 9.6 oz)  12/23/22 (!) 167.8 kg (370 lb)  12/05/22 (!) 169.2  kg (373 lb)   PHYSICAL EXAM: General:  NAD. No resp difficulty, walked into clinic HEENT: Normal Neck: Supple. No JVD, thick neck. Carotids 2+ bilat; no bruits. No lymphadenopathy or thryomegaly appreciated. Cor: PMI nondisplaced. Regular rate & rhythm. No rubs, gallops or murmurs. Lungs: Clear Abdomen: Soft, obese, nontender, nondistended. No hepatosplenomegaly. No bruits or masses. Good bowel sounds. Extremities: No cyanosis, clubbing, rash, edema Neuro: Alert & oriented x 3, cranial nerves grossly intact. Moves all 4 extremities w/o difficulty. Affect pleasant.  ASSESSMENT & PLAN: 1. Atrial fibrillation: Paroyxsmal. Emergent DCCV in 6/22. Regular on exam today. - Continue amiodarone 100 mg daily.  Recent LFTs and TSH normal.  Needs regular eye exam.  - Continue Xarelto. No bleeding issues. CBC today. - Based on body habitus, not good ablation candidate.  2.  Chronic systolic CHF: Nonischemic cardiomyopathy. ?If ETOH has played a role (now drinks rarely). Echo (3/18) with EF 25-30%. Echo 7/19 and in 2/21 with EF 20-25%. Echo 6/22 with EF < 20%, normal RV.  Has St Jude CRT-D device now.  Echo-post CRT (8/23) showed EF <20%, mild MR, RV normal.  CPX in 9/23  with moderate functional limitation due to HF and body habitus.  NYHA class II, not volume overloaded by exam or device interrogation. He is enrolled in monthly fluid monitoring. - Restart losartan 25 mg daily. BMET/BNP today, repeat BMET in 10-14 days. - Continue torsemide 60 daily alternating with 40 mg daily w/ 40 KCL daily.   - Continue spironolactone 25 mg daily. - Continue Farxiga 10 mg daily. - Continue Coreg 6.25 mg bid.   - No Entresto, ACEI with h/o angioedema.  - Headaches with Bidil, cannot take.  - He did not tolerate digoxin.  - has not wanted to do cardiac rehab.  - Insurance denied baroreceptor activation therapy and he has CRT now.  - I am concerned about him long-term, he is too heavy for heart transplant and needs  weight loss even to get him to LVAD.    3. HTN: BP stable. Med changes as above.  4. 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  Has St Jude CRT-D device now.  5. OSA: Continue nightly BiPAP. 6. PE: 04/14/2018, diagnosed by V/Q scan. Has been on Xarelto. No bleeding issues. 7. Anxiety/Panic attacks/depression: Followed by psychiatry. 8. Hyperlipidemia: Continue atorvastatin.   9. Obesity: Body mass index is 55.08 kg/m.  - He is now on Mounjaro - [facility]  - Has not wanted to do cardiac rehab.   10. Tobacco use: Back smoking. Discussed cessation. 11. ED: Needs follow up with PCP or Urology as he reports unresponsive to high-dose sildenafil.  Follow up in 3 months with Dr. Aundra Dubin.  [place] FNP 01/09/2023

## 2023-01-09 ENCOUNTER — Ambulatory Visit (HOSPITAL_COMMUNITY)
Admission: RE | Admit: 2023-01-09 | Discharge: 2023-01-09 | Disposition: A | Discharge status: Home / Self Care | Payer: Medicare HMO | Source: Ambulatory Visit | Attending: Family Medicine | Admitting: Family Medicine

## 2023-01-09 ENCOUNTER — Encounter (HOSPITAL_COMMUNITY): Payer: Self-pay

## 2023-01-09 VITALS — BP 120/94 | HR 75 | Ht 68.5 in | Wt 367.6 lb

## 2023-01-09 DIAGNOSIS — Z791 Long term (current) use of non-steroidal anti-inflammatories (NSAID): Secondary | ICD-10-CM | POA: Insufficient documentation

## 2023-01-09 DIAGNOSIS — I5022 Chronic systolic (congestive) heart failure: Secondary | ICD-10-CM | POA: Insufficient documentation

## 2023-01-09 DIAGNOSIS — I4901 Ventricular fibrillation: Secondary | ICD-10-CM | POA: Diagnosis not present

## 2023-01-09 DIAGNOSIS — I428 Other cardiomyopathies: Secondary | ICD-10-CM

## 2023-01-09 DIAGNOSIS — Z79899 Other long term (current) drug therapy: Secondary | ICD-10-CM | POA: Diagnosis not present

## 2023-01-09 DIAGNOSIS — Z7901 Long term (current) use of anticoagulants: Secondary | ICD-10-CM | POA: Diagnosis not present

## 2023-01-09 DIAGNOSIS — F419 Anxiety disorder, unspecified: Secondary | ICD-10-CM

## 2023-01-09 DIAGNOSIS — Z8679 Personal history of other diseases of the circulatory system: Secondary | ICD-10-CM | POA: Diagnosis not present

## 2023-01-09 DIAGNOSIS — E785 Hyperlipidemia, unspecified: Secondary | ICD-10-CM | POA: Diagnosis not present

## 2023-01-09 DIAGNOSIS — Z6841 Body Mass Index (BMI) 40.0 and over, adult: Secondary | ICD-10-CM | POA: Diagnosis not present

## 2023-01-09 DIAGNOSIS — F410 Panic disorder [episodic paroxysmal anxiety] without agoraphobia: Secondary | ICD-10-CM | POA: Insufficient documentation

## 2023-01-09 DIAGNOSIS — Z86711 Personal history of pulmonary embolism: Secondary | ICD-10-CM | POA: Diagnosis not present

## 2023-01-09 DIAGNOSIS — I480 Paroxysmal atrial fibrillation: Secondary | ICD-10-CM

## 2023-01-09 DIAGNOSIS — I472 Ventricular tachycardia, unspecified: Secondary | ICD-10-CM | POA: Insufficient documentation

## 2023-01-09 DIAGNOSIS — R69 Illness, unspecified: Secondary | ICD-10-CM | POA: Diagnosis not present

## 2023-01-09 DIAGNOSIS — R519 Headache, unspecified: Secondary | ICD-10-CM | POA: Diagnosis not present

## 2023-01-09 DIAGNOSIS — R002 Palpitations: Secondary | ICD-10-CM | POA: Diagnosis not present

## 2023-01-09 DIAGNOSIS — G4733 Obstructive sleep apnea (adult) (pediatric): Secondary | ICD-10-CM | POA: Diagnosis not present

## 2023-01-09 DIAGNOSIS — I130 Hypertensive heart and chronic kidney disease with heart failure and stage 1 through stage 4 chronic kidney disease, or unspecified chronic kidney disease: Secondary | ICD-10-CM | POA: Diagnosis not present

## 2023-01-09 DIAGNOSIS — R042 Hemoptysis: Secondary | ICD-10-CM | POA: Insufficient documentation

## 2023-01-09 DIAGNOSIS — F17210 Nicotine dependence, cigarettes, uncomplicated: Secondary | ICD-10-CM | POA: Diagnosis not present

## 2023-01-09 DIAGNOSIS — I10 Essential (primary) hypertension: Secondary | ICD-10-CM | POA: Diagnosis not present

## 2023-01-09 DIAGNOSIS — Z720 Tobacco use: Secondary | ICD-10-CM

## 2023-01-09 DIAGNOSIS — Z7984 Long term (current) use of oral hypoglycemic drugs: Secondary | ICD-10-CM | POA: Insufficient documentation

## 2023-01-09 DIAGNOSIS — N529 Male erectile dysfunction, unspecified: Secondary | ICD-10-CM | POA: Diagnosis not present

## 2023-01-09 LAB — BASIC METABOLIC PANEL
Anion gap: 13 (ref 5–15)
BUN: 18 mg/dL (ref 6–20)
CO2: 21 mmol/L — ABNORMAL LOW (ref 22–32)
Calcium: 8.9 mg/dL (ref 8.9–10.3)
Chloride: 105 mmol/L (ref 98–111)
Creatinine, Ser: 1.68 mg/dL — ABNORMAL HIGH (ref 0.61–1.24)
GFR, Estimated: 48 mL/min — ABNORMAL LOW (ref >60)
Glucose, Bld: 85 mg/dL (ref 70–99)
Potassium: 3.2 mmol/L — ABNORMAL LOW (ref 3.5–5.1)
Sodium: 139 mmol/L (ref 135–145)

## 2023-01-09 LAB — CBC
HCT: 43.3 % (ref 39.0–52.0)
Hemoglobin: 15.4 g/dL (ref 13.0–17.0)
MCH: 28.9 pg (ref 26.0–34.0)
MCHC: 35.6 g/dL (ref 30.0–36.0)
MCV: 81.2 fL (ref 80.0–100.0)
Platelets: 292 10*3/uL (ref 150–400)
RBC: 5.33 MIL/uL (ref 4.22–5.81)
RDW: 14.7 % (ref 11.5–15.5)
WBC: 7.6 10*3/uL (ref 4.0–10.5)
nRBC: 0 % (ref 0.0–0.2)

## 2023-01-09 LAB — BRAIN NATRIURETIC PEPTIDE: B Natriuretic Peptide: 173.4 pg/mL — ABNORMAL HIGH (ref 0.0–100.0)

## 2023-01-09 MED ORDER — SPIRONOLACTONE 25 MG PO TABS: 25.0000 mg | ORAL_TABLET | Freq: Every evening | ORAL | 2 refills | Status: DC

## 2023-01-09 NOTE — Patient Instructions (Addendum)
Thank you for coming in today  Labs were done today, if any labs are abnormal the clinic will call you No news is good news  Your physician recommends that you schedule a follow-up appointment in:  3 months with Dr. Kendall Flack will receive a reminder letter in the mail a few months in advance. If you don't receive a letter, please call our office to schedule the follow-up appointment.  Your physician recommends that you return for lab work in:  2 weeks you were given a prescription to get labs at your local [facility]   Do the following things EVERYDAY: Weigh yourself in the morning before breakfast. Write it down and keep it in a log. Take your medicines as prescribed Eat low salt foods--Limit salt (sodium) to 2000 mg per day.  Stay as active as you can everyday Limit all fluids for the day to less than 2 liters   At the [facility], you and your health needs are our priority. As part of our continuing mission to provide you with exceptional heart care, we have created designated Provider Care Teams. These Care Teams include your primary Cardiologist (physician) and Advanced Practice Providers (APPs- Physician Assistants and Nurse Practitioners) who all work together to provide you with the care you need, when you need it.   You may see any of the following providers on your designated Care Team at your next follow up: Dr Glori Bickers Dr [name] Dr. Roxana Hires, NP Lyda Jester, [place] [facility] [place], [place] Forestine Na, NP Audry Riles, PharmD   Please be sure to bring in all your medications bottles to every appointment.    Thank you for choosing [facility]   If you have any questions or concerns before your next appointment please send [place] a message through [place] or call our office at [phone].    TO LEAVE A MESSAGE FOR THE NURSE SELECT OPTION 2, PLEASE LEAVE A MESSAGE  INCLUDING: YOUR NAME DATE OF BIRTH CALL BACK NUMBER REASON FOR CALL**this is important as we prioritize the call backs  YOU WILL RECEIVE A CALL BACK THE SAME DAY AS LONG AS YOU CALL BEFORE 4:00 PM

## 2023-01-12 ENCOUNTER — Encounter: Payer: Self-pay | Admitting: Physician Assistant

## 2023-01-12 ENCOUNTER — Ambulatory Visit (INDEPENDENT_AMBULATORY_CARE_PROVIDER_SITE_OTHER): Payer: Medicare HMO | Admitting: Physician Assistant

## 2023-01-12 ENCOUNTER — Telehealth (HOSPITAL_COMMUNITY): Payer: Self-pay | Admitting: *Deleted

## 2023-01-12 DIAGNOSIS — M7701 Medial epicondylitis, right elbow: Secondary | ICD-10-CM | POA: Diagnosis not present

## 2023-01-12 DIAGNOSIS — M778 Other enthesopathies, not elsewhere classified: Secondary | ICD-10-CM

## 2023-01-12 MED ORDER — METHYLPREDNISOLONE ACETATE 40 MG/ML IJ SUSP
20.0000 mg | INTRAMUSCULAR | Status: AC | PRN
  Administered 2023-01-12: 20 mg via INTRAMUSCULAR

## 2023-01-12 MED ORDER — LIDOCAINE HCL 1 % IJ SOLN
0.5000 mL | INTRAMUSCULAR | Status: AC | PRN
  Administered 2023-01-12: .5 mL

## 2023-01-12 NOTE — Telephone Encounter (Signed)
Called patient per Adam Katz, NP with following lab results and instructions:  "K is low, please ensure he has been taking 40 KCL daily. We restarted losartan today so that should help bring levels up some.   He needs to increase KCL to 40 daily x 3 days, then back to 40 daily thereafter. He was given Rx today for repeat labs at [facility]  in [place]"  Pt verbalized understanding of above and will get labs re-checked locally as instructed. He can call Heart Failure Clinic at [phone] if any questions or concerns.

## 2023-01-12 NOTE — Progress Notes (Signed)
Office Visit Note   Patient: [name]           Date of Birth: 08/26/1966           MRN: [id number] Visit Date: 01/12/2023              Requested by: [name], MD [address] [place],  [place] [address] PCP: [name], MD   Assessment & Plan: Visit Diagnoses:  1. Triceps tendonitis   2. Medial epicondylitis of right elbow     Plan: Patient states that he believes most of the time near [place] area.  Therefore he would like to go to therapy here there.  He is given a prescription for right elbow range of motion to include modalities and strengthening home exercise program for triceps insertional tendinitis and medial epicondylitis of the right elbow.  Will see him back in 4 to 6 weeks to see how he is doing overall.  He tolerated the injection well and stated he had decreased pain with range of motion of the right elbow postinjection.  Follow-Up Instructions: Return in about 6 weeks (around 02/23/2023).   Orders:  Orders Placed This Encounter  Procedures   Trigger Point Inj   No orders of the defined types were placed in this encounter.     Procedures: Trigger Point Inj  Date/Time: 01/12/2023 12:07 PM  Performed by: Pete Pelt, PA-C Authorized by: Pete Pelt, PA-C   Consent Given by:  Patient Site marked: the procedure site was marked   Timeout: prior to procedure the correct patient, procedure, and site was verified   Indications:  Pain and therapeutic Total # of Trigger Points:  1 Location: upper extremity   Needle Size:  25 G Approach:  Dorsal Medications #1:  0.5 mL lidocaine 1 %; 20 mg methylPREDNISolone acetate 40 MG/ML    Clinical Data: No additional findings.   Subjective: Chief Complaint  Patient presents with   Right Elbow - Pain    HPI [name] is 57 year old male were seen for the first time for right elbow pain.  He is right-hand dominant.  He states he has had pain in the elbow for about 2 months.  He had  radiographs of the right elbow which I personally reviewed these are dated 11/28/2022 and showed multiple views of the elbow.  There is a large olecranon spur present.  Otherwise no significant arthritic changes.  No acute fractures or acute findings. He states he has tried creams for his elbow.  Also was placed on a short round of oral steroids which helped.  He notes some swelling of the elbow.  Denies any numbness tingling down the arm.  Does have some pain that radiates down the arm into the wrist area though at times.  He does have a history of gout.  He is diabetic but reports good control of his diabetes with a hemoglobin A1c of 5.7.He rates his pain to be 7 out of 10 pain.  He notes decreased range of motion of the elbow.  He is on chronic Xarelto due to history of A-fib and PE.  Review of Systems See HPI.  Objective: Vital Signs: There were no vitals taken for this visit.  Physical Exam General: Well-developed well-nourished male no acute distress mood and affect appropriate.  Psych alert and oriented x 3  Ortho Exam Left elbow he lacks last few degrees in full extension but has no pain with range of motion of the  left elbow actively and passively.  Nontender about the left elbow throughout.  He has full supination pronation of the left forearm.  Right elbow lacks last 10 degrees in full extension and lacks approximately 20 degrees in full supination of the forearm.  He has full pronation.  Tenderness over the medial epicondyle region of the right elbow and also at the triceps insertion.  5 out of 5 strength bilateral biceps and triceps against resistance.  Provocative maneuvers of the right wrist causes pain medial aspect of the right elbow.  There is no rashes skin lesions ulcerations of bilateral elbows forearms.  Bilateral hands full sensation.  Radial pulses are 2+ bilaterally.  Full motor bilateral hands. Specialty Comments:  No specialty comments available.  Imaging: No results  found.   PMFS History: Patient Active Problem List   Diagnosis Date Noted   History of pulmonary embolus (PE) 09/25/2022   Long toenail 02/13/2022   COVID 01/14/2022   ICD (implantable cardioverter-defibrillator) in place 01/13/2022   Diuretic-induced hypokalemia 07/08/2021   Hemoptysis 05/10/2021   Colon cancer screening 11/19/2020   Hyperlipidemia with target LDL less than 100 10/12/2019   Screen for colon cancer 10/12/2019   Hidradenitis suppurativa of left axilla 10/12/2019   Benign prostatic hyperplasia without lower urinary tract symptoms 10/12/2019   Claudication of both lower extremities ([facility]) 04/20/2019   Drug-induced erectile dysfunction 02/28/2019   Vitamin D deficiency disease 01/20/2019   Osteoarthritis of both ankles and feet 11/30/2018   Diabetic neuropathy, painful ([facility]) 11/30/2018   Fatty liver disease, nonalcoholic 0000000   Thiamin deficiency 04/29/2018   Cholelithiasis 04/22/2018   Hyperbilirubinemia    PE (pulmonary thromboembolism) ([facility]) 04/12/2017   Hereditary and idiopathic peripheral neuropathy 07/16/2016   Paroxysmal atrial fibrillation (HCC)    Chronic anticoagulation, secondary to PE 03/2014 12/27/2014   Osteoarthritis of both knees 12/27/2014   NSVT (nonsustained ventricular tachycardia) ([facility]) 09/26/2013   Alcohol abuse 08/10/2013   Hypertriglyceridemia 06/29/2013   Nonischemic cardiomyopathy ([facility]) 01/26/2013   Type II diabetes mellitus with manifestations ([facility]) 04/13/2012   Shortness of breath 02/18/2012   Tobacco user 02/18/2012   ED (erectile dysfunction) 07/11/2011   ATRIAL FIBRILLATION, PAROXYSMAL 09/18/2010   Morbid obesity ([facility]) 06/11/2010   Essential hypertension 06/11/2010   Chronic combined systolic and diastolic CHF (congestive heart failure) ([facility]) 06/11/2010   Esophageal reflux 06/11/2010   OSA (obstructive sleep apnea) with BiPap and oxygen 06/11/2010   Past Medical History:  Diagnosis Date   Alcohol abuse    Anxiety state,  unspecified    Atrial fibrillation (HCC)    CHF (congestive heart failure) (HCC)    Chronic systolic heart failure (HCC)    Diabetes mellitus, type II (HCC)    Edema    Gout    History of medication noncompliance    Migraine    Obesity, unspecified    Obstructive sleep apnea    Psychiatric disorder    Pulmonary embolism (HCC)    Shortness of breath     Family History  Problem Relation Age of Onset   Cancer Mother        brain tumor   Hypertension Mother    Diabetes Father        Deceased, 38   Heart disease Maternal Grandmother    Hypertension Other        Family History   Stroke Other        Family History   Diabetes Other        Family History  Diabetes Daughter     Past Surgical History:  Procedure Laterality Date   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   Procedure: BIV ICD INSERTION CRT-D;  Surgeon: Vickie Epley, MD;  Location: [facility];  Service: Cardiovascular;  Laterality: N/A;   CARDIAC CATHETERIZATION     CARDIAC CATHETERIZATION N/A 08/13/2015   Procedure: Right/Left Heart Cath and Coronary Angiography;  Surgeon: [name], MD;  Location: [facility];  Service: Cardiovascular;  Laterality: N/A;   PACEMAKER INSERTION     RIGHT/LEFT HEART CATH AND CORONARY ANGIOGRAPHY N/A 04/14/2017   Procedure: Right/Left Heart Cath and Coronary Angiography;  Surgeon: [name], MD;  Location: [facility];  Service: Cardiovascular;  Laterality: N/A;   TESTICLE SURGERY     Social History   Occupational History   Occupation: DISABLED  Tobacco Use   Smoking status: Some Days    Packs/day: 0.50    Years: 30.00    Total pack years: 15.00    Types: Cigarettes    Last attempt to quit: 05/15/2020    Years since quitting: 2.6   Smokeless tobacco: Never   Tobacco comments:    vaping - nicotine-free products  Vaping Use   Vaping Use: Never used  Substance and Sexual Activity   Alcohol use: Not Currently    Alcohol/week: 0.0  standard drinks of alcohol   Drug use: No   Sexual activity: Not Currently

## 2023-01-14 ENCOUNTER — Ambulatory Visit: Payer: Medicare HMO | Admitting: Skilled Nursing Facility1

## 2023-01-19 ENCOUNTER — Telehealth: Payer: Self-pay | Admitting: Pharmacist

## 2023-01-19 MED ORDER — MOUNJARO 7.5 MG/0.5ML ~~LOC~~ SOAJ: 7.5000 mg | SUBCUTANEOUS | 0 refills | Status: DC

## 2023-01-19 NOTE — Telephone Encounter (Signed)
Called pt to follow up with Mounjaro tolerability and dose increase. No GI upset, a little swollen in his stomach that feels like some bloating. Glucose 93 lowest, gives injections on Wednesdays. Only other DM he takes is [place] (for CHF as well). Will increase Mounjaro to 7.81m weekly and I'll call pt in another month. He will keep an eye on glucose readings and call if he starts seeing readings < 70.

## 2023-01-23 ENCOUNTER — Other Ambulatory Visit: Payer: Self-pay | Admitting: Psychiatry

## 2023-01-25 DIAGNOSIS — G4733 Obstructive sleep apnea (adult) (pediatric): Secondary | ICD-10-CM | POA: Diagnosis not present

## 2023-01-27 ENCOUNTER — Ambulatory Visit: Payer: Medicare HMO

## 2023-01-27 DIAGNOSIS — I428 Other cardiomyopathies: Secondary | ICD-10-CM

## 2023-01-28 LAB — CUP PACEART REMOTE DEVICE CHECK
Battery Remaining Longevity: 70 mo
Battery Remaining Percentage: 80 %
Battery Voltage: 2.98 V
Brady Statistic AP VP Percent: 84 %
Brady Statistic AP VS Percent: 1.4 %
Brady Statistic AS VP Percent: 15 %
Brady Statistic AS VS Percent: 1 %
Brady Statistic RA Percent Paced: 85 %
Date Time Interrogation Session: 20240227074217
HighPow Impedance: 74 Ohm
Implantable Lead Connection Status: 753985
Implantable Lead Connection Status: 753985
Implantable Lead Connection Status: 753985
Implantable Lead Implant Date: 20230213
Implantable Lead Implant Date: 20230213
Implantable Lead Implant Date: 20230213
Implantable Lead Location: 753857
Implantable Lead Location: 753859
Implantable Lead Location: 753860
Implantable Pulse Generator Implant Date: 20230213
Lead Channel Impedance Value: 400 Ohm
Lead Channel Impedance Value: 460 Ohm
Lead Channel Impedance Value: 900 Ohm
Lead Channel Pacing Threshold Amplitude: 0.75 V
Lead Channel Pacing Threshold Amplitude: 0.75 V
Lead Channel Pacing Threshold Amplitude: 0.75 V
Lead Channel Pacing Threshold Pulse Width: 0.5 ms
Lead Channel Pacing Threshold Pulse Width: 0.5 ms
Lead Channel Pacing Threshold Pulse Width: 0.5 ms
Lead Channel Sensing Intrinsic Amplitude: 12 mV
Lead Channel Sensing Intrinsic Amplitude: 2.1 mV
Lead Channel Setting Pacing Amplitude: 1.5 V
Lead Channel Setting Pacing Amplitude: 2 V
Lead Channel Setting Pacing Amplitude: 2.5 V
Lead Channel Setting Pacing Pulse Width: 0.5 ms
Lead Channel Setting Pacing Pulse Width: 0.5 ms
Lead Channel Setting Sensing Sensitivity: 0.5 mV
Pulse Gen Serial Number: 210000080
Zone Setting Status: 755011

## 2023-01-30 DIAGNOSIS — M25521 Pain in right elbow: Secondary | ICD-10-CM | POA: Diagnosis not present

## 2023-02-02 ENCOUNTER — Ambulatory Visit: Payer: Medicare HMO | Attending: Cardiology

## 2023-02-02 DIAGNOSIS — Z95810 Presence of automatic (implantable) cardiac defibrillator: Secondary | ICD-10-CM | POA: Diagnosis not present

## 2023-02-02 DIAGNOSIS — I5022 Chronic systolic (congestive) heart failure: Secondary | ICD-10-CM

## 2023-02-04 NOTE — Progress Notes (Signed)
EPIC Encounter for ICM Monitoring  Patient Name: [name] is a 57 y.o. male Date: 02/04/2023 Primary Care Physican: [name], MD Primary Cardiologist: Aundra Dubin Electrophysiologist: Marisa Sprinkles Pacing: 99% 06/06/2022 Weight: 360 lbs    06/16/2022 Weight: 360 lbs     06/20/2022 Weight: 356 lbs      08/13/2022 Weight: 372 lbs  10/22/2022 Weight: 372 lbs   11/25/2022 Weight: 365 lbs   02/04/2023 Weight:                                    Spoke with patient and heart failure questions reviewed.  Transmission results reviewed.  Pt asymptomatic for fluid accumulation.  He missed a few doses of Torsemide during decreased impedance.    CorVue thoracic impedance normal but was suggesting possible fluid accumulation from 2/22-2/26.    Prescribed:  Torsemide 20 mg Take 3 tablets (60 mg total) by mouth alternating 2 tablets (40 mg total) every other day. Potassium 20 mEq take 1 tablet(s) (20 mEq total) by mouth. Spironolactone 25 mg take 1 tablet daily   Labs: 01/09/2023 Creatinine 1.68, BUN 18, Potassium 3.2, Sodium 139, GFR 48  12/23/2022 Creatinine 1.53, BUN 15, Potassium 3.9, Sodium 137, GFR 54  10/10/2022 Creatinine 1.74, BUN 16, Potassium 4.1, Sodium 140, GFR 46 09/25/2022 Creatinine 1.72, BUN 22, Potassium 3.8, Sodium 139, GFR 44.67  A complete set of results can be found in Results Review.   Recommendations:  No changes and encouraged to call if experiencing any fluid symptoms.   Follow-up plan: ICM clinic phone appointment 03/09/2023.   91 day device clinic remote transmission 04/28/2023.     EP/Cardiology Office Visits:    02/10/2023 with Dr Quentin Ore.   Copy of ICM check sent to Dr. Quentin Ore.    3 month ICM trend: 02/02/2023.    12-14 Month ICM trend:     Rosalene Billings, RN 02/04/2023 1:20 PM

## 2023-02-05 ENCOUNTER — Encounter: Payer: Self-pay | Admitting: Internal Medicine

## 2023-02-05 ENCOUNTER — Encounter: Payer: Self-pay | Admitting: Radiology

## 2023-02-05 ENCOUNTER — Ambulatory Visit (INDEPENDENT_AMBULATORY_CARE_PROVIDER_SITE_OTHER): Payer: Medicare HMO | Admitting: Internal Medicine

## 2023-02-05 VITALS — BP 122/78 | HR 73 | Temp 98.4°F | Resp 16 | Ht 68.0 in | Wt 360.0 lb

## 2023-02-05 DIAGNOSIS — E519 Thiamine deficiency, unspecified: Secondary | ICD-10-CM | POA: Diagnosis not present

## 2023-02-05 DIAGNOSIS — R3912 Poor urinary stream: Secondary | ICD-10-CM | POA: Diagnosis not present

## 2023-02-05 DIAGNOSIS — E876 Hypokalemia: Secondary | ICD-10-CM

## 2023-02-05 DIAGNOSIS — T502X5A Adverse effect of carbonic-anhydrase inhibitors, benzothiadiazides and other diuretics, initial encounter: Secondary | ICD-10-CM | POA: Diagnosis not present

## 2023-02-05 DIAGNOSIS — Z0001 Encounter for general adult medical examination with abnormal findings: Secondary | ICD-10-CM

## 2023-02-05 DIAGNOSIS — E785 Hyperlipidemia, unspecified: Secondary | ICD-10-CM | POA: Diagnosis not present

## 2023-02-05 DIAGNOSIS — N182 Chronic kidney disease, stage 2 (mild): Secondary | ICD-10-CM | POA: Diagnosis not present

## 2023-02-05 DIAGNOSIS — I5042 Chronic combined systolic (congestive) and diastolic (congestive) heart failure: Secondary | ICD-10-CM

## 2023-02-05 DIAGNOSIS — K760 Fatty (change of) liver, not elsewhere classified: Secondary | ICD-10-CM

## 2023-02-05 DIAGNOSIS — E118 Type 2 diabetes mellitus with unspecified complications: Secondary | ICD-10-CM

## 2023-02-05 DIAGNOSIS — I10 Essential (primary) hypertension: Secondary | ICD-10-CM

## 2023-02-05 DIAGNOSIS — L602 Onychogryphosis: Secondary | ICD-10-CM | POA: Diagnosis not present

## 2023-02-05 LAB — HEPATIC FUNCTION PANEL
ALT: 18 U/L (ref 0–53)
AST: 16 U/L (ref 0–37)
Albumin: 3.9 g/dL (ref 3.5–5.2)
Alkaline Phosphatase: 137 U/L — ABNORMAL HIGH (ref 39–117)
Bilirubin, Direct: 0.2 mg/dL (ref 0.0–0.3)
Total Bilirubin: 0.9 mg/dL (ref 0.2–1.2)
Total Protein: 7.8 g/dL (ref 6.0–8.3)

## 2023-02-05 LAB — URINALYSIS, ROUTINE W REFLEX MICROSCOPIC
Bilirubin Urine: NEGATIVE
Hgb urine dipstick: NEGATIVE
Ketones, ur: NEGATIVE
Leukocytes,Ua: NEGATIVE
Nitrite: NEGATIVE
Specific Gravity, Urine: 1.015 (ref 1.000–1.030)
Total Protein, Urine: NEGATIVE
Urine Glucose: >=1000 — AB
Urobilinogen, UA: 1 (ref 0.0–1.0)
pH: 6 (ref 5.0–8.0)

## 2023-02-05 LAB — LIPID PANEL
Cholesterol: 132 mg/dL (ref 0–200)
HDL: 34.5 mg/dL — ABNORMAL LOW (ref >39.00)
LDL Cholesterol: 65 mg/dL (ref 0–99)
NonHDL: 97.4
Total CHOL/HDL Ratio: 4
Triglycerides: 162 mg/dL — ABNORMAL HIGH (ref 0.0–149.0)
VLDL: 32.4 mg/dL (ref 0.0–40.0)

## 2023-02-05 LAB — MICROALBUMIN / CREATININE URINE RATIO
Creatinine,U: 174.8 mg/dL
Microalb Creat Ratio: 0.7 mg/g (ref 0.0–30.0)
Microalb, Ur: 1.2 mg/dL (ref 0.0–1.9)

## 2023-02-05 LAB — HEMOGLOBIN A1C: Hgb A1c MFr Bld: 6 % (ref 4.6–6.5)

## 2023-02-05 LAB — TSH: TSH: 2.98 u[IU]/mL (ref 0.35–5.50)

## 2023-02-05 LAB — PSA: PSA: 0.51 ng/mL (ref 0.10–4.00)

## 2023-02-05 LAB — MAGNESIUM: Magnesium: 1.8 mg/dL (ref 1.5–2.5)

## 2023-02-05 NOTE — Patient Instructions (Signed)
Health Maintenance, Male Adopting a healthy lifestyle and getting preventive care are important in promoting health and wellness. Ask your health care provider about: The right schedule for you to have regular tests and exams. Things you can do on your own to prevent diseases and keep yourself healthy. What should I know about diet, weight, and exercise? Eat a healthy diet  Eat a diet that includes plenty of vegetables, fruits, low-fat dairy products, and lean protein. Do not eat a lot of foods that are high in solid fats, added sugars, or sodium. Maintain a healthy weight Body mass index (BMI) is a measurement that can be used to identify possible weight problems. It estimates body fat based on height and weight. Your health care provider can help determine your BMI and help you achieve or maintain a healthy weight. Get regular exercise Get regular exercise. This is one of the most important things you can do for your health. Most adults should: Exercise for at least 150 minutes each week. The exercise should increase your heart rate and make you sweat (moderate-intensity exercise). Do strengthening exercises at least twice a week. This is in addition to the moderate-intensity exercise. Spend less time sitting. Even light physical activity can be beneficial. Watch cholesterol and blood lipids Have your blood tested for lipids and cholesterol at 55 years of age, then have this test every 5 years. You may need to have your cholesterol levels checked more often if: Your lipid or cholesterol levels are high. You are older than 55 years of age. You are at high risk for heart disease. What should I know about cancer screening? Many types of cancers can be detected early and may often be prevented. Depending on your health history and family history, you may need to have cancer screening at various ages. This may include screening for: Colorectal cancer. Prostate cancer. Skin cancer. Lung  cancer. What should I know about heart disease, diabetes, and high blood pressure? Blood pressure and heart disease High blood pressure causes heart disease and increases the risk of stroke. This is more likely to develop in people who have high blood pressure readings or are overweight. Talk with your health care provider about your target blood pressure readings. Have your blood pressure checked: Every 3-5 years if you are 18-39 years of age. Every year if you are 40 years old or older. If you are between the ages of 65 and 75 and are a current or former smoker, ask your health care provider if you should have a one-time screening for abdominal aortic aneurysm (AAA). Diabetes Have regular diabetes screenings. This checks your fasting blood sugar level. Have the screening done: Once every three years after age 45 if you are at a normal weight and have a low risk for diabetes. More often and at a younger age if you are overweight or have a high risk for diabetes. What should I know about preventing infection? Hepatitis B If you have a higher risk for hepatitis B, you should be screened for this virus. Talk with your health care provider to find out if you are at risk for hepatitis B infection. Hepatitis C Blood testing is recommended for: Everyone born from 1945 through 1965. Anyone with known risk factors for hepatitis C. Sexually transmitted infections (STIs) You should be screened each year for STIs, including gonorrhea and chlamydia, if: You are sexually active and are younger than 55 years of age. You are older than 55 years of age and your   health care provider tells you that you are at risk for this type of infection. Your sexual activity has changed since you were last screened, and you are at increased risk for chlamydia or gonorrhea. Ask your health care provider if you are at risk. Ask your health care provider about whether you are at high risk for HIV. Your health care provider  may recommend a prescription medicine to help prevent HIV infection. If you choose to take medicine to prevent HIV, you should first get tested for HIV. You should then be tested every 3 months for as long as you are taking the medicine. Follow these instructions at home: Alcohol use Do not drink alcohol if your health care provider tells you not to drink. If you drink alcohol: Limit how much you have to 0-2 drinks a day. Know how much alcohol is in your drink. In the [place] one drink equals one 12 oz bottle of beer (355 mL), one 5 oz glass of wine (148 mL), or one 1 oz glass of hard liquor (44 mL). Lifestyle Do not use any products that contain nicotine or tobacco. These products include cigarettes, chewing tobacco, and vaping devices, such as e-cigarettes. If you need help quitting, ask your health care provider. Do not use street drugs. Do not share needles. Ask your health care provider for help if you need support or information about quitting drugs. General instructions Schedule regular health, dental, and eye exams. Stay current with your vaccines. Tell your health care provider if: You often feel depressed. You have ever been abused or do not feel safe at home. Summary Adopting a healthy lifestyle and getting preventive care are important in promoting health and wellness. Follow your health care provider's instructions about healthy diet, exercising, and getting tested or screened for diseases. Follow your health care provider's instructions on monitoring your cholesterol and blood pressure. This information is not intended to replace advice given to you by your health care provider. Make sure you discuss any questions you have with your health care provider. Document Revised: 04/08/2021 Document Reviewed: 04/08/2021 Elsevier Patient Education  2023 [employer]  

## 2023-02-05 NOTE — Progress Notes (Signed)
Subjective:  Patient ID: [name], male    DOB: 1966-05-26  Age: 57 y.o. MRN: [id number]  CC: Annual Exam, Hypertension, Hyperlipidemia, and Diabetes      HPI [name] presents for a CPX and f/up ---  He complains of SOB but denies CP, diaphoresis, or weight gain.  Outpatient Medications Prior to Visit  Medication Sig Dispense Refill   albuterol (VENTOLIN HFA) 108 (90 Base) MCG/ACT inhaler INHALE 1-2 PUFFS INTO THE LUNGS EVERY 4 (FOUR) HOURS AS NEEDED FOR SHORTNESS OF BREATH OR WHEEZING. 8.5 each 1   amiodarone (PACERONE) 200 MG tablet Take 0.5 tablets (100 mg total) by mouth daily. Patient taking once daily 90 tablet 1   ARIPiprazole (ABILIFY) 15 MG tablet Take 1 tablet (15 mg total) by mouth daily. 90 tablet 0   atorvastatin (LIPITOR) 40 MG tablet Take 1 tablet (40 mg total) by mouth daily. 90 tablet 3   carvedilol (COREG) 6.25 MG tablet Take 1 tablet (6.25 mg total) by mouth 2 (two) times daily. 60 tablet 11   clonazePAM (KLONOPIN) 1 MG tablet Take 0.5 tablets (0.5 mg total) by mouth daily. May also take 1 tablet (1 mg total) at bedtime as needed for anxiety. 45 tablet 1   diclofenac Sodium (VOLTAREN) 1 % GEL Apply 2 g topically 4 (four) times daily. 100 g 0   mirtazapine (REMERON) 7.5 MG tablet Take 1 tablet (7.5 mg total) by mouth at bedtime. 30 tablet 1   pantoprazole (PROTONIX) 40 MG tablet TAKE 1 TABLET BY MOUTH EVERY DAY 90 tablet 0   potassium chloride SA (KLOR-CON M) 20 MEQ tablet Take 2 tablets (40 mEq total) by mouth daily. 180 tablet 3   PRESCRIPTION MEDICATION Inhale into the lungs See admin instructions. Bipap, pressure 16/12 with 2L of O2 - use whenever sleeping     sildenafil (REVATIO) 20 MG tablet TAKE 1 TABLET BY MOUTH AS NEEDED, MAY take UP TO FOUR TABLETS AS NEEDED 20 tablet 0   sildenafil (VIAGRA) 50 MG tablet Take 1 tablet by mouth daily as needed for erectile dysfunction (begin with one tablet --may take 2 if necessary). 15 tablet 5    spironolactone (ALDACTONE) 25 MG tablet Take 1 tablet (25 mg total) by mouth at bedtime. 90 tablet 2   torsemide (DEMADEX) 20 MG tablet Take '60mg'$  daily,alternating with 40 mg daily 180 tablet 3   XARELTO 20 MG TABS tablet TAKE 1 TABLET BY MOUTH DAILY WITH SUPPER. 90 tablet 1   FARXIGA 10 MG TABS tablet TAKE 1 TABLET BY MOUTH EVERY DAY 90 tablet 1   buPROPion (WELLBUTRIN XL) 150 MG 24 hr tablet Take 1 tablet (150 mg total) by mouth daily. 30 tablet 1   tirzepatide (MOUNJARO) 7.5 MG/0.5ML Pen Inject 7.5 mg into the skin once a week. (Patient not taking: Reported on 02/05/2023) 2 mL 0   No facility-administered medications prior to visit.    ROS Review of Systems  Constitutional: Negative.  Negative for diaphoresis, fatigue, fever and unexpected weight change.  HENT: Negative.    Eyes: Negative.   Respiratory:  Positive for apnea and shortness of breath. Negative for cough, chest tightness and wheezing.   Cardiovascular:  Negative for chest pain, palpitations and leg swelling.  Gastrointestinal:  Negative for abdominal pain, diarrhea, nausea and vomiting.  Endocrine: Negative.   Genitourinary:  Positive for difficulty urinating. Negative for dysuria and hematuria.  Musculoskeletal: Negative.  Negative for arthralgias and myalgias.  Skin: Negative.  Negative for color  change.  Neurological:  Negative for dizziness and weakness.  Hematological:  Negative for adenopathy. Does not bruise/bleed easily.  Psychiatric/Behavioral:  Positive for dysphoric mood. Negative for confusion, decreased concentration, sleep disturbance and suicidal ideas. The patient is nervous/anxious.     Objective:  BP 122/78 (BP Location: Left Arm, Patient Position: Sitting, Cuff Size: Normal)   Pulse 73   Temp 98.4 F (36.9 C) (Oral)   Resp 16   Ht '5\' 8"'$  (1.727 m)   Wt (!) 360 lb (163.3 kg)   SpO2 96%   BMI 54.74 kg/m   BP Readings from Last 3 Encounters:  02/05/23 122/78  01/09/23 (!) 120/94  12/23/22 120/85     Wt Readings from Last 3 Encounters:  02/05/23 (!) 360 lb (163.3 kg)  01/09/23 (!) 367 lb 9.6 oz (166.7 kg)  12/23/22 (!) 370 lb (167.8 kg)    Physical Exam Vitals reviewed.  Constitutional:      General: He is not in acute distress.    Appearance: He is obese. He is ill-appearing. He is not toxic-appearing or diaphoretic.  HENT:     Nose: Nose normal.     Mouth/Throat:     Mouth: Mucous membranes are moist.  Eyes:     General: No scleral icterus.    Conjunctiva/sclera: Conjunctivae normal.  Cardiovascular:     Rate and Rhythm: Normal rate and regular rhythm.     Heart sounds: No murmur heard.    No friction rub. No gallop.  Pulmonary:     Effort: Pulmonary effort is normal.     Breath sounds: No stridor. No wheezing, rhonchi or rales.  Abdominal:     General: Abdomen is protuberant. Bowel sounds are normal. There is no distension.     Palpations: There is no hepatomegaly, splenomegaly or mass.     Tenderness: There is no abdominal tenderness. There is no guarding.  Genitourinary:    Comments: He was not willing to do a GU/rectal exam Musculoskeletal:        General: Normal range of motion.     Cervical back: Neck supple.     Right lower leg: No edema.     Left lower leg: No edema.  Skin:    General: Skin is warm and dry.     Coloration: Skin is not pale.  Neurological:     General: No focal deficit present.     Mental Status: He is alert.     Lab Results  Component Value Date   WBC 7.6 01/09/2023   HGB 15.4 01/09/2023   HCT 43.3 01/09/2023   PLT 292 01/09/2023   GLUCOSE 64 (L) 02/06/2023   CHOL 132 02/05/2023   TRIG 162.0 (H) 02/05/2023   HDL 34.50 (L) 02/05/2023   LDLDIRECT 114.0 03/21/2020   LDLCALC 65 02/05/2023   ALT 18 02/05/2023   AST 16 02/05/2023   NA 138 02/06/2023   K 4.2 02/06/2023   CL 103 02/06/2023   CREATININE 1.86 (H) 02/06/2023   BUN 24 (H) 02/06/2023   CO2 17 (L) 02/06/2023   TSH 2.98 02/05/2023   PSA 0.51 02/05/2023   INR 1.5  (H) 09/25/2022   HGBA1C 6.0 02/05/2023   MICROALBUR 1.2 02/05/2023    No results found.  Assessment & Plan:   [name] was seen today for annual exam, hypertension, hyperlipidemia and diabetes.  Diagnoses and all orders for this visit:  Diuretic-induced hypokalemia -     Magnesium; Future -     Magnesium -  Basic metabolic panel; Future -     Basic metabolic panel  Weak urine stream -     PSA; Future -     PSA  Essential hypertension- His BP is well controlled. -     TSH; Future -     Urinalysis, Routine w reflex microscopic; Future -     Urinalysis, Routine w reflex microscopic -     TSH -     Basic metabolic panel; Future -     Basic metabolic panel  Type II diabetes mellitus with manifestations ([place])- his blood sugar is well controlled. -     Urinalysis, Routine w reflex microscopic; Future -     Hemoglobin A1c; Future -     Microalbumin / creatinine urine ratio; Future -     Microalbumin / creatinine urine ratio -     Hemoglobin A1c -     Urinalysis, Routine w reflex microscopic -     Basic metabolic panel; Future -     Basic metabolic panel -     dapagliflozin propanediol (FARXIGA) 10 MG TABS tablet; Take 1 tablet (10 mg total) by mouth daily.  Thiamin deficiency  Long toenail -     Ambulatory referral to Podiatry  Hyperbilirubinemia -     Hepatic function panel; Future -     Hepatic function panel  Fatty liver disease, nonalcoholic- LFT's are normal.  Hyperlipidemia with target LDL less than 100- LDL goal achieved. Doing well on the statin  -     Lipid panel; Future -     Lipid panel  Chronic renal disease, stage 2, mildly decreased glomerular filtration rate (GFR) between 60-89 mL/min/1.73 square meter- Will restart the SGLT-2 inh. -     dapagliflozin propanediol (FARXIGA) 10 MG TABS tablet; Take 1 tablet (10 mg total) by mouth daily.  Chronic combined systolic and diastolic CHF (congestive heart failure) ([place])- Normal volmue status. -      dapagliflozin propanediol (FARXIGA) 10 MG TABS tablet; Take 1 tablet (10 mg total) by mouth daily.  Encounter for general adult medical examination with abnormal findings- Exam completed, labs reviewed, vaccines updated, cancer screenings addressed (he will do the cologuard test), pt ed material was given.   I have changed [name]'s Farxiga to dapagliflozin propanediol. I am also having him maintain his PRESCRIPTION MEDICATION, albuterol, sildenafil, torsemide, atorvastatin, potassium chloride SA, buPROPion, Xarelto, amiodarone, diclofenac Sodium, ARIPiprazole, pantoprazole, sildenafil, carvedilol, mirtazapine, clonazePAM, spironolactone, and Mounjaro.  Meds ordered this encounter  Medications   dapagliflozin propanediol (FARXIGA) 10 MG TABS tablet    Sig: Take 1 tablet (10 mg total) by mouth daily.    Dispense:  90 tablet    Refill:  1     Follow-up: Return in about 6 months (around 08/08/2023).  Scarlette Calico, MD

## 2023-02-06 DIAGNOSIS — Z1211 Encounter for screening for malignant neoplasm of colon: Secondary | ICD-10-CM | POA: Insufficient documentation

## 2023-02-06 DIAGNOSIS — Z0001 Encounter for general adult medical examination with abnormal findings: Secondary | ICD-10-CM | POA: Insufficient documentation

## 2023-02-06 LAB — BASIC METABOLIC PANEL
BUN: 24 mg/dL — ABNORMAL HIGH (ref 6–23)
CO2: 17 mEq/L — ABNORMAL LOW (ref 19–32)
Calcium: 9.5 mg/dL (ref 8.4–10.5)
Chloride: 103 mEq/L (ref 96–112)
Creatinine, Ser: 1.86 mg/dL — ABNORMAL HIGH (ref 0.40–1.50)
GFR: 40.56 mL/min — ABNORMAL LOW (ref >60.00)
Glucose, Bld: 64 mg/dL — ABNORMAL LOW (ref 70–99)
Potassium: 4.2 mEq/L (ref 3.5–5.1)
Sodium: 138 mEq/L (ref 135–145)

## 2023-02-06 MED ORDER — DAPAGLIFLOZIN PROPANEDIOL 10 MG PO TABS: 10.0000 mg | ORAL_TABLET | Freq: Every day | ORAL | 1 refills | Status: DC

## 2023-02-09 NOTE — Progress Notes (Unsigned)
  Electrophysiology Office Follow up Visit Note:    Date:  02/10/2023   ID:  [name], DOB [birth year], MRN [id number]  PCP:  [name], MD  [facility] Cardiologist:  Fransico Him, MD  [facility] Electrophysiologist:  Vickie Epley, MD    Interval History:    [name] is a 57 y.o. male who presents for a follow up visit.   He has HFrEF and had a CRT-D implanted 01/13/2022.  Recent remotes show stable device function.       Past medical, surgical, social and family history were reviewed.  ROS:   Please see the history of present illness.    All other systems reviewed and are negative.  EKGs/Labs/Other Studies Reviewed:    The following studies were reviewed today:  02/10/2023 in clinic device interrogation personally reviewed BiV pacing 98% Lead parameter stable No high-voltage therapies Atrial pacing 85% Underlying rhythm present at 63 bpm No programming changes made today  EKG:  The ekg ordered today demonstrates a sensed, BiV pacing   Physical Exam:    VS:  BP 134/82   Pulse 75   Ht 5' 8.5" (1.74 m)   Wt (!) 358 lb 9.6 oz (162.7 kg)   SpO2 98%   BMI 53.73 kg/m     Wt Readings from Last 3 Encounters:  02/10/23 (!) 358 lb 9.6 oz (162.7 kg)  02/05/23 (!) 360 lb (163.3 kg)  01/09/23 (!) 367 lb 9.6 oz (166.7 kg)     GEN:  Well nourished, well developed in no acute distress.  Obese CARDIAC: RRR, no murmurs, rubs, gallops.  ICD pocket well-healed RESPIRATORY:  Clear to auscultation without rales, wheezing or rhonchi       ASSESSMENT:    1. Chronic combined systolic and diastolic CHF (congestive heart failure) ([place])   2. ICD (implantable cardioverter-defibrillator) in place   3. AF (paroxysmal atrial fibrillation) ([place])   4. Encounter for long-term (current) use of high-risk medication    PLAN:    In order of problems listed above:   #HFrEF #ICD in situ NYHA II. Warm and dry by exam and OptiVol Continue GDMT ICD  functioning appropriately.  #pAF #High risk med - amiodarone Cont amio and xarelto  Follow up 6 months.  CMP, TSH and free T4 at that visit.       Signed, Lars Mage, MD, [facility], [facility] 02/10/2023 11:53 AM    Electrophysiology [facility]

## 2023-02-10 ENCOUNTER — Encounter: Payer: Self-pay | Admitting: Cardiology

## 2023-02-10 ENCOUNTER — Ambulatory Visit: Payer: Medicare HMO | Attending: Cardiology | Admitting: Cardiology

## 2023-02-10 VITALS — BP 134/82 | HR 75 | Ht 68.5 in | Wt 358.6 lb

## 2023-02-10 DIAGNOSIS — I5042 Chronic combined systolic (congestive) and diastolic (congestive) heart failure: Secondary | ICD-10-CM | POA: Diagnosis not present

## 2023-02-10 DIAGNOSIS — I480 Paroxysmal atrial fibrillation: Secondary | ICD-10-CM

## 2023-02-10 DIAGNOSIS — Z79899 Other long term (current) drug therapy: Secondary | ICD-10-CM

## 2023-02-10 DIAGNOSIS — Z95810 Presence of automatic (implantable) cardiac defibrillator: Secondary | ICD-10-CM

## 2023-02-10 LAB — CUP PACEART INCLINIC DEVICE CHECK
Battery Remaining Longevity: 69 mo
Brady Statistic RA Percent Paced: 85 %
Brady Statistic RV Percent Paced: 98 %
Date Time Interrogation Session: 20240312160903
HighPow Impedance: 77.625
Implantable Lead Connection Status: 753985
Implantable Lead Connection Status: 753985
Implantable Lead Connection Status: 753985
Implantable Lead Implant Date: 20230213
Implantable Lead Implant Date: 20230213
Implantable Lead Implant Date: 20230213
Implantable Lead Location: 753857
Implantable Lead Location: 753859
Implantable Lead Location: 753860
Implantable Pulse Generator Implant Date: 20230213
Lead Channel Impedance Value: 1100 Ohm
Lead Channel Impedance Value: 437.5 Ohm
Lead Channel Impedance Value: 487.5 Ohm
Lead Channel Pacing Threshold Amplitude: 0.75 V
Lead Channel Pacing Threshold Amplitude: 0.75 V
Lead Channel Pacing Threshold Amplitude: 0.75 V
Lead Channel Pacing Threshold Amplitude: 0.75 V
Lead Channel Pacing Threshold Amplitude: 0.75 V
Lead Channel Pacing Threshold Amplitude: 0.75 V
Lead Channel Pacing Threshold Pulse Width: 0.5 ms
Lead Channel Pacing Threshold Pulse Width: 0.5 ms
Lead Channel Pacing Threshold Pulse Width: 0.5 ms
Lead Channel Pacing Threshold Pulse Width: 0.5 ms
Lead Channel Pacing Threshold Pulse Width: 0.5 ms
Lead Channel Pacing Threshold Pulse Width: 0.5 ms
Lead Channel Sensing Intrinsic Amplitude: 12 mV
Lead Channel Sensing Intrinsic Amplitude: 3.8 mV
Lead Channel Setting Pacing Amplitude: 1.5 V
Lead Channel Setting Pacing Amplitude: 2 V
Lead Channel Setting Pacing Amplitude: 2.5 V
Lead Channel Setting Pacing Pulse Width: 0.5 ms
Lead Channel Setting Pacing Pulse Width: 0.5 ms
Lead Channel Setting Sensing Sensitivity: 0.5 mV
Pulse Gen Serial Number: 210000080
Zone Setting Status: 755011

## 2023-02-10 NOTE — Patient Instructions (Signed)
Medication Instructions:  Your physician recommends that you continue on your current medications as directed. Please refer to the Current Medication list given to you today.  *If you need a refill on your cardiac medications before your next appointment, please call your pharmacy*  Follow-Up: At [facility], you and your health needs are our priority.  As part of our continuing mission to provide you with exceptional heart care, we have created designated Provider Care Teams.  These Care Teams include your primary Cardiologist (physician) and Advanced Practice Providers (APPs -  Physician Assistants and Nurse Practitioners) who all work together to provide you with the care you need, when you need it.  Your next appointment:   6 month(s)  Provider:   You will see one of the following Advanced Practice Providers on your designated Care Team:   Renee Ursuy, PA-C Michael "Andy" Tillery, PA-C Suzann Riddle, NP   

## 2023-02-13 ENCOUNTER — Other Ambulatory Visit (HOSPITAL_COMMUNITY): Payer: Self-pay

## 2023-02-13 MED ORDER — LOSARTAN POTASSIUM 25 MG PO TABS: 25.0000 mg | ORAL_TABLET | Freq: Every day | ORAL | 3 refills | Status: DC
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
## 2023-02-16 ENCOUNTER — Telehealth: Payer: Self-pay | Admitting: Pharmacist

## 2023-02-16 MED ORDER — MOUNJARO 10 MG/0.5ML ~~LOC~~ SOAJ: 10.0000 mg | SUBCUTANEOUS | 0 refills | Status: DC

## 2023-02-16 NOTE — Telephone Encounter (Signed)
Called pt to f/u with Mounjaro tolerability. Saw EP recently, weight down from 373 baseline to 358 for 15 lb weight loss so far.  Pt states he went 1 week without Mounjaro since the pharmacy couldn't get it in stock. Gave his 2nd dose of 7.5mg  today, tolerating well without GI issues. Glucose was 115 today, no lows. Also on Farxiga (DM and CHF). He'd like to increase his dose for the next fill. Rx sent in and hopefully pharmacy will be able to get in stock by the time he needs it in a few weeks.

## 2023-02-18 ENCOUNTER — Encounter: Payer: Self-pay | Admitting: Psychiatry

## 2023-02-18 ENCOUNTER — Telehealth (INDEPENDENT_AMBULATORY_CARE_PROVIDER_SITE_OTHER): Payer: Medicare HMO | Admitting: Psychiatry

## 2023-02-18 DIAGNOSIS — F4310 Post-traumatic stress disorder, unspecified: Secondary | ICD-10-CM | POA: Diagnosis not present

## 2023-02-18 DIAGNOSIS — F3132 Bipolar disorder, current episode depressed, moderate: Secondary | ICD-10-CM | POA: Diagnosis not present

## 2023-02-18 DIAGNOSIS — F1320 Sedative, hypnotic or anxiolytic dependence, uncomplicated: Secondary | ICD-10-CM

## 2023-02-18 DIAGNOSIS — F410 Panic disorder [episodic paroxysmal anxiety] without agoraphobia: Secondary | ICD-10-CM

## 2023-02-18 DIAGNOSIS — R69 Illness, unspecified: Secondary | ICD-10-CM | POA: Diagnosis not present

## 2023-02-18 MED ORDER — BUPROPION HCL ER (XL) 150 MG PO TB24: 150.0000 mg | ORAL_TABLET | Freq: Every day | ORAL | 0 refills | Status: DC

## 2023-02-18 MED ORDER — CLONAZEPAM 1 MG PO TABS: 1.0000 mg | ORAL_TABLET | Freq: Every day | ORAL | 1 refills | Status: DC | PRN

## 2023-02-18 MED ORDER — MIRTAZAPINE 15 MG PO TABS: 15.0000 mg | ORAL_TABLET | Freq: Every day | ORAL | 1 refills | Status: DC

## 2023-02-18 NOTE — Patient Instructions (Signed)
Continue Abilify 15 mg daily  Continue bupropion 150 mg daily  Increase mirtazapine 15 mg at night Restart clonazepam 1 mg daily as needed for anxiety  Next appointment- 5/13 at 1 pm

## 2023-02-23 DIAGNOSIS — G4733 Obstructive sleep apnea (adult) (pediatric): Secondary | ICD-10-CM | POA: Diagnosis not present

## 2023-02-25 ENCOUNTER — Ambulatory Visit (INDEPENDENT_AMBULATORY_CARE_PROVIDER_SITE_OTHER): Payer: Medicare HMO | Admitting: Orthopaedic Surgery

## 2023-02-25 ENCOUNTER — Encounter: Payer: Self-pay | Admitting: Orthopaedic Surgery

## 2023-02-25 DIAGNOSIS — M778 Other enthesopathies, not elsewhere classified: Secondary | ICD-10-CM | POA: Diagnosis not present

## 2023-02-25 DIAGNOSIS — M7701 Medial epicondylitis, right elbow: Secondary | ICD-10-CM

## 2023-02-25 NOTE — Progress Notes (Signed)
The patient comes in with continued right triceps pain and pain over the medial aspect of his right elbow.  He is someone who cannot have an MRI due to a pacemaker and defibrillator.  He is only 74 and has a history of congestive heart failure.  He has been wearing an Ace wrap around his elbow and tightening that down some of that compression has helped some.  A steroid injection has been placed around the medial epicondyle which she tolerated well but he said that did not really help.  He has been through physical therapy as well for the elbow and he said that has not helped either.  He does have still tenderness over the medial epicondyle and the triceps of the right elbow.  His range of motion is full and I did not sense that I can feel any instability of the elbow at all.  His ulnar nerve did not subluxate either.  I would like to send him to my partner Dr. Sammuel Hines to have him take a look at the patient's right elbow to see if there is anything else that could be recommended including unfortunately the possibility of a surgical intervention.  The patient agrees with this treatment plan.

## 2023-03-04 ENCOUNTER — Other Ambulatory Visit: Payer: Self-pay | Admitting: Psychiatry

## 2023-03-04 DIAGNOSIS — F3132 Bipolar disorder, current episode depressed, moderate: Secondary | ICD-10-CM

## 2023-03-04 NOTE — Progress Notes (Signed)
Remote ICD transmission.   

## 2023-03-09 ENCOUNTER — Ambulatory Visit: Payer: Medicare HMO | Attending: Cardiology

## 2023-03-09 DIAGNOSIS — Z95810 Presence of automatic (implantable) cardiac defibrillator: Secondary | ICD-10-CM | POA: Diagnosis not present

## 2023-03-09 DIAGNOSIS — I5042 Chronic combined systolic (congestive) and diastolic (congestive) heart failure: Secondary | ICD-10-CM

## 2023-03-11 NOTE — Progress Notes (Signed)
EPIC Encounter for ICM Monitoring  Patient Name: [name] is a 57 y.o. male Date: 03/11/2023 Primary Care Physican: Etta Grandchild, MD Primary Cardiologist: Shirlee Latch Electrophysiologist: Townsend Roger Pacing: 96% 10/22/2022 Weight: 372 lbs   11/25/2022 Weight: 365 lbs   02/04/2023 Weight:  358 lbs                                   Spoke with patient and heart failure questions reviewed.  Transmission results reviewed.  Pt asymptomatic for fluid accumulation.  He feels really tired.   CorVue thoracic impedance normal but was suggesting possible fluid accumulation from 3/22-3/30.    Prescribed:  Torsemide 20 mg Take 3 tablets (60 mg total) by mouth alternating 2 tablets (40 mg total) every other day. Potassium 20 mEq take 2 tablet(s) (40 mEq total) by mouth. Spironolactone 25 mg take 1 tablet daily   Labs: 02/06/2023 Creatinine 1.86, BUN 24, Potassium 4.2, Sodium 138, GFR 40.56 01/09/2023 Creatinine 1.68, BUN 18, Potassium 3.2, Sodium 139, GFR 48  12/23/2022 Creatinine 1.53, BUN 15, Potassium 3.9, Sodium 137, GFR 54  10/10/2022 Creatinine 1.74, BUN 16, Potassium 4.1, Sodium 140, GFR 46 09/25/2022 Creatinine 1.72, BUN 22, Potassium 3.8, Sodium 139, GFR 44.67  A complete set of results can be found in Results Review.   Recommendations:  No changes and encouraged to call if experiencing any fluid symptoms.   Follow-up plan: ICM clinic phone appointment 04/13/2023.   91 day device clinic remote transmission 04/28/2023.     EP/Cardiology Office Visits:   Recall 04/08/2023 with Dr Shirlee Latch.  Recall 95/2024 with EP APP.   Copy of ICM check sent to Dr. Lalla Brothers.    3 month ICM trend: 03/09/2023.    12-14 Month ICM trend:     Karie Soda, RN 03/11/2023 1:28 PM

## 2023-03-12 ENCOUNTER — Other Ambulatory Visit: Payer: Self-pay | Admitting: Psychiatry

## 2023-03-13 ENCOUNTER — Ambulatory Visit (HOSPITAL_BASED_OUTPATIENT_CLINIC_OR_DEPARTMENT_OTHER): Payer: Medicare HMO | Admitting: Orthopaedic Surgery

## 2023-03-26 DIAGNOSIS — G4733 Obstructive sleep apnea (adult) (pediatric): Secondary | ICD-10-CM | POA: Diagnosis not present

## 2023-03-30 ENCOUNTER — Other Ambulatory Visit: Payer: Self-pay | Admitting: Internal Medicine

## 2023-03-30 ENCOUNTER — Telehealth: Payer: Self-pay | Admitting: Pharmacist

## 2023-03-30 DIAGNOSIS — I480 Paroxysmal atrial fibrillation: Secondary | ICD-10-CM

## 2023-03-30 MED ORDER — MOUNJARO 10 MG/0.5ML ~~LOC~~ SOAJ: 10.0000 mg | SUBCUTANEOUS | 11 refills | Status: DC

## 2023-03-30 NOTE — Telephone Encounter (Signed)
Called pt to f/u with Mounjaro tolerability. Gives his 4th dose of 10mg  today. Appetite is definitely lower. Trying to keep up nutrition, drinking some Ensure. Glucose readings are stable. Pt prefers to continue on same dose of Mounjaro, 10mg  dose sent in with refills. He will let me know if he wishes to increase his dose in the future.

## 2023-04-08 ENCOUNTER — Telehealth: Payer: Self-pay

## 2023-04-08 ENCOUNTER — Other Ambulatory Visit: Payer: Self-pay | Admitting: Internal Medicine

## 2023-04-08 DIAGNOSIS — I5042 Chronic combined systolic (congestive) and diastolic (congestive) heart failure: Secondary | ICD-10-CM

## 2023-04-08 DIAGNOSIS — E118 Type 2 diabetes mellitus with unspecified complications: Secondary | ICD-10-CM

## 2023-04-08 DIAGNOSIS — K219 Gastro-esophageal reflux disease without esophagitis: Secondary | ICD-10-CM

## 2023-04-08 DIAGNOSIS — N182 Chronic kidney disease, stage 2 (mild): Secondary | ICD-10-CM

## 2023-04-08 MED ORDER — PANTOPRAZOLE SODIUM 40 MG PO TBEC
40.0000 mg | DELAYED_RELEASE_TABLET | Freq: Every day | ORAL | 0 refills | Status: DC
Start: 2023-04-08 — End: 2023-07-28

## 2023-04-08 NOTE — Telephone Encounter (Signed)
[facility] [place].Marland KitchenRaechel Chute

## 2023-04-08 NOTE — Telephone Encounter (Signed)
Pt has called and stated he is needing a rx refill for his pantoprazole (PROTONIX) 40 MG tablet  and is needing [facility] to [id number], [place] - [id number]
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
## 2023-04-13 ENCOUNTER — Ambulatory Visit: Payer: Medicare HMO | Attending: Cardiology

## 2023-04-13 ENCOUNTER — Telehealth: Payer: Self-pay | Admitting: Internal Medicine

## 2023-04-13 ENCOUNTER — Telehealth (INDEPENDENT_AMBULATORY_CARE_PROVIDER_SITE_OTHER): Payer: Medicare HMO | Admitting: Psychiatry

## 2023-04-13 ENCOUNTER — Encounter: Payer: Self-pay | Admitting: Psychiatry

## 2023-04-13 DIAGNOSIS — F1010 Alcohol abuse, uncomplicated: Secondary | ICD-10-CM | POA: Diagnosis not present

## 2023-04-13 DIAGNOSIS — I5042 Chronic combined systolic (congestive) and diastolic (congestive) heart failure: Secondary | ICD-10-CM | POA: Diagnosis not present

## 2023-04-13 DIAGNOSIS — F4310 Post-traumatic stress disorder, unspecified: Secondary | ICD-10-CM

## 2023-04-13 DIAGNOSIS — F410 Panic disorder [episodic paroxysmal anxiety] without agoraphobia: Secondary | ICD-10-CM | POA: Diagnosis not present

## 2023-04-13 DIAGNOSIS — F3132 Bipolar disorder, current episode depressed, moderate: Secondary | ICD-10-CM | POA: Diagnosis not present

## 2023-04-13 DIAGNOSIS — Z95810 Presence of automatic (implantable) cardiac defibrillator: Secondary | ICD-10-CM | POA: Diagnosis not present

## 2023-04-13 DIAGNOSIS — F1320 Sedative, hypnotic or anxiolytic dependence, uncomplicated: Secondary | ICD-10-CM | POA: Diagnosis not present

## 2023-04-13 MED ORDER — ARIPIPRAZOLE 15 MG PO TABS
15.0000 mg | ORAL_TABLET | Freq: Every day | ORAL | 0 refills | Status: DC
Start: 2023-04-13 — End: 2023-06-22

## 2023-04-13 MED ORDER — CLONAZEPAM 1 MG PO TABS: 1.0000 mg | ORAL_TABLET | Freq: Every day | ORAL | 2 refills | Status: DC | PRN

## 2023-04-13 NOTE — Telephone Encounter (Signed)
Contacted [name] to schedule their annual wellness visit. Appointment made for 04/28/2023.  [facility] Care Guide [facility] AWV TEAM Direct Dial: [phone]

## 2023-04-13 NOTE — Patient Instructions (Signed)
Restart Abilify 15 mg daily  Continue bupropion 150 mg daily  Continue mirtazapine 7.5 mg at night  Continue clonazepam 1 mg daily as needed for anxiety  Next appointment- 7/22 at 3 pm

## 2023-04-15 NOTE — Progress Notes (Signed)
EPIC Encounter for ICM Monitoring  Patient Name: [name] is a 57 y.o. male Date: 04/15/2023 Primary Care Physican: Etta Grandchild, MD Primary Cardiologist: Shirlee Latch Electrophysiologist: Townsend Roger Pacing: 97% 10/22/2022 Weight: 372 lbs   11/25/2022 Weight: 365 lbs   02/04/2023 Weight:  358 lbs                                   Spoke with patient and heart failure questions reviewed.  Transmission results reviewed.  Pt asymptomatic for fluid accumulation.  Reports feeling well at this time and voices no complaints.     CorVue thoracic impedance normal but was suggesting possible fluid accumulation from 5/1-5/5 and 5/6-5/10.    Prescribed:  Torsemide 20 mg Take 3 tablets (60 mg total) by mouth alternating 2 tablets (40 mg total) every other day. Potassium 20 mEq take 2 tablet(s) (40 mEq total) by mouth. Spironolactone 25 mg take 1 tablet daily   Labs: 02/06/2023 Creatinine 1.86, BUN 24, Potassium 4.2, Sodium 138, GFR 40.56 01/09/2023 Creatinine 1.68, BUN 18, Potassium 3.2, Sodium 139, GFR 48  12/23/2022 Creatinine 1.53, BUN 15, Potassium 3.9, Sodium 137, GFR 54  10/10/2022 Creatinine 1.74, BUN 16, Potassium 4.1, Sodium 140, GFR 46 09/25/2022 Creatinine 1.72, BUN 22, Potassium 3.8, Sodium 139, GFR 44.67  A complete set of results can be found in Results Review.   Recommendations:  No changes and encouraged to call if experiencing any fluid symptoms.   Follow-up plan: ICM clinic phone appointment 05/18/2023.   91 day device clinic remote transmission 04/28/2023.     EP/Cardiology Office Visits:   Advised to call Dr Alford Highland office for office appointment.  Recall 04/09/2023 with Dr Shirlee Latch.  Recall 08/09/2023 with EP APP.   Copy of ICM check sent to Dr. Lalla Brothers.     3 month ICM trend: 04/13/2023.    12-14 Month ICM trend:     Karie Soda, RN 04/15/2023 8:04 AM

## 2023-04-25 DIAGNOSIS — G4733 Obstructive sleep apnea (adult) (pediatric): Secondary | ICD-10-CM | POA: Diagnosis not present

## 2023-04-28 ENCOUNTER — Ambulatory Visit (INDEPENDENT_AMBULATORY_CARE_PROVIDER_SITE_OTHER): Payer: Medicare HMO

## 2023-04-28 ENCOUNTER — Telehealth: Payer: Self-pay

## 2023-04-28 DIAGNOSIS — I428 Other cardiomyopathies: Secondary | ICD-10-CM | POA: Diagnosis not present

## 2023-04-28 NOTE — Telephone Encounter (Signed)
Tried to called patient regarding his AWV. Called patient twice and lvm for patient to call the office back regarding his appt.

## 2023-04-29 LAB — CUP PACEART REMOTE DEVICE CHECK
Battery Remaining Longevity: 67 mo
Battery Remaining Percentage: 77 %
Battery Voltage: 2.98 V
Brady Statistic AP VP Percent: 77 %
Brady Statistic AP VS Percent: 2.3 %
Brady Statistic AS VP Percent: 20 %
Brady Statistic AS VS Percent: 1 %
Brady Statistic RA Percent Paced: 79 %
Date Time Interrogation Session: 20240528020154
HighPow Impedance: 69 Ohm
Implantable Lead Connection Status: 753985
Implantable Lead Connection Status: 753985
Implantable Lead Connection Status: 753985
Implantable Lead Implant Date: 20230213
Implantable Lead Implant Date: 20230213
Implantable Lead Implant Date: 20230213
Implantable Lead Location: 753857
Implantable Lead Location: 753859
Implantable Lead Location: 753860
Implantable Pulse Generator Implant Date: 20230213
Lead Channel Impedance Value: 1125 Ohm
Lead Channel Impedance Value: 410 Ohm
Lead Channel Impedance Value: 460 Ohm
Lead Channel Pacing Threshold Amplitude: 0.75 V
Lead Channel Pacing Threshold Amplitude: 0.75 V
Lead Channel Pacing Threshold Amplitude: 0.75 V
Lead Channel Pacing Threshold Pulse Width: 0.5 ms
Lead Channel Pacing Threshold Pulse Width: 0.5 ms
Lead Channel Pacing Threshold Pulse Width: 0.5 ms
Lead Channel Sensing Intrinsic Amplitude: 11.8 mV
Lead Channel Sensing Intrinsic Amplitude: 2.3 mV
Lead Channel Setting Pacing Amplitude: 1.5 V
Lead Channel Setting Pacing Amplitude: 2 V
Lead Channel Setting Pacing Amplitude: 2.5 V
Lead Channel Setting Pacing Pulse Width: 0.5 ms
Lead Channel Setting Pacing Pulse Width: 0.5 ms
Lead Channel Setting Sensing Sensitivity: 0.5 mV
Pulse Gen Serial Number: 210000080
Zone Setting Status: 755011

## 2023-05-04 ENCOUNTER — Other Ambulatory Visit: Payer: Self-pay | Admitting: Psychiatry

## 2023-05-05 MED ORDER — MOUNJARO 12.5 MG/0.5ML ~~LOC~~ SOAJ: 12.5000 mg | SUBCUTANEOUS | 2 refills | Status: DC

## 2023-05-05 NOTE — Telephone Encounter (Signed)
Patient called for Mounjaro dose titration. Reports he does not have any side effects tolerates 10 mg weekly dose well and he has lost almost 36 lbs on GLP1 treatment. His FBG now~100-120 mg/dl range  Would like to go on next dose Mounjaro 12.5 mg once week.   Prescription sent to [facility] as per patient's request.

## 2023-05-05 NOTE — Addendum Note (Signed)
Addended by: Tylene Fantasia on: 05/05/2023 02:56 PM   Modules accepted: Orders

## 2023-05-06 ENCOUNTER — Encounter: Payer: Self-pay | Admitting: Pharmacist

## 2023-05-06 NOTE — Telephone Encounter (Signed)
This encounter was created in error - please disregard.

## 2023-05-20 NOTE — Progress Notes (Signed)
No ICM remote transmission received for 05/18/2023 and next ICM transmission scheduled for 05/25/2023.   

## 2023-05-21 NOTE — Progress Notes (Signed)
Remote ICD transmission.   

## 2023-05-26 DIAGNOSIS — G4733 Obstructive sleep apnea (adult) (pediatric): Secondary | ICD-10-CM | POA: Diagnosis not present

## 2023-05-27 NOTE — Progress Notes (Signed)
No ICM remote transmission received for 05/25/2023 and next ICM transmission scheduled for 06/01/2023.

## 2023-06-01 ENCOUNTER — Ambulatory Visit: Payer: Medicare HMO | Attending: Cardiology

## 2023-06-01 DIAGNOSIS — I5042 Chronic combined systolic (congestive) and diastolic (congestive) heart failure: Secondary | ICD-10-CM

## 2023-06-01 DIAGNOSIS — Z95810 Presence of automatic (implantable) cardiac defibrillator: Secondary | ICD-10-CM

## 2023-06-01 MED ORDER — TORSEMIDE 20 MG PO TABS: 20.0000 mg | ORAL_TABLET | Freq: Every day | ORAL | 3 refills | Status: DC

## 2023-06-01 NOTE — Progress Notes (Signed)
EPIC Encounter for ICM Monitoring  Patient Name: [name] is a 57 y.o. male Date: 06/01/2023 Primary Care Physican: Etta Grandchild, MD Primary Cardiologist: Shirlee Latch Electrophysiologist: Townsend Roger Pacing: 97% 10/22/2022 Weight: 372 lbs   11/25/2022 Weight: 365 lbs   02/04/2023 Weight:  358 lbs       06/01/2023 Weight: 328 lbs (losing weight taking Mounjaro)                            Spoke with patient and heart failure questions reviewed.  Transmission results reviewed.  Pt asymptomatic for fluid accumulation.  He reports he only takes Torsemide 40 mg once a week instead of the recommended dosage due to concern about how the medication affects his kidneys.  He does not feel like that he needs that much Torsemide.   Pt is not drinking a lot of fluids and eating less since taking Mounjaro.     CorVue thoracic impedance suggesting pattern of possible fluid accumulation alternating with possible dryness.    Prescribed:  Torsemide 20 mg Take 3 tablets (60 mg total) by mouth alternating 2 tablets (40 mg total) every other day.  2 tablets  Potassium 20 mEq take 2 tablet(s) (40 mEq total) by mouth. Spironolactone 25 mg take 1 tablet daily   Labs: 02/06/2023 Creatinine 1.86, BUN 24, Potassium 4.2, Sodium 138, GFR 40.56 01/09/2023 Creatinine 1.68, BUN 18, Potassium 3.2, Sodium 139, GFR 48  12/23/2022 Creatinine 1.53, BUN 15, Potassium 3.9, Sodium 137, GFR 54  10/10/2022 Creatinine 1.74, BUN 16, Potassium 4.1, Sodium 140, GFR 46 09/25/2022 Creatinine 1.72, BUN 22, Potassium 3.8, Sodium 139, GFR 44.67  A complete set of results can be found in Results Review.   Recommendations:  Advised to call Dr Alford Highland office for appointment and to try to take Torsemide 40 mg daily for the next week instead of once a week and will recheck fluid levels in a week.    Copy sent to Dr Shirlee Latch for review and recommendations regarding Torsemide dosage and any adjustment to Potassium dosage.  Advised to keep fluid  intake consistent and close to but not over 64 oz daily to stay hydrated.     Follow-up plan: ICM clinic phone appointment 06/08/2023 to recheck fluid levels.   91 day device clinic remote transmission 07/28/2023.     EP/Cardiology Office Visits:   Recall 04/09/2023 with Dr Shirlee Latch.  Recall 08/09/2023 with EP APP.   Copy of ICM check sent to Dr. Lalla Brothers.      3 month ICM trend: 06/01/2023.    12-14 Month ICM trend:     Karie Soda, RN 06/01/2023 8:51 AM

## 2023-06-01 NOTE — Progress Notes (Signed)
Spoke with patient and advised Dr Shirlee Latch recommended he take Torsemide 20 mg daily.  Pt verbalized understanding and agreed with plan.  He will call back with any changes in condition.

## 2023-06-01 NOTE — Progress Notes (Signed)
Would have him take torsemide 20 mg daily long-term (if he is only taking torsemide 40 mg once weekly now).

## 2023-06-03 ENCOUNTER — Telehealth: Payer: Self-pay | Admitting: Pharmacist

## 2023-06-03 MED ORDER — MOUNJARO 10 MG/0.5ML ~~LOC~~ SOAJ: 10.0000 mg | SUBCUTANEOUS | 11 refills | Status: DC

## 2023-06-03 NOTE — Telephone Encounter (Signed)
Pt called clinic to follow up with his Mounjaro. Feels weaker, goes some days without eating at all, stomach hurt for a week, drinking more Ensure. Had a bit more of an appetite on the 10mg  dose. Just ate a wrap. Down to 328 lbs.  Discussed decreasing his Mounjaro dose, 12.5mg  seems too strong as we don't want his appetite completely suppressed/causing fatigue. Will decrease back to 10mg  weekly. Pt will monitor for improvement in appetite/food intake/fatigue. Will add refills to this dose and consider it his maintenance dose. Pt advised to call clinic if he feels that dose needs to be reduced further.

## 2023-06-08 ENCOUNTER — Ambulatory Visit: Payer: Medicare HMO | Attending: Cardiology

## 2023-06-08 DIAGNOSIS — I5042 Chronic combined systolic (congestive) and diastolic (congestive) heart failure: Secondary | ICD-10-CM

## 2023-06-08 DIAGNOSIS — Z95810 Presence of automatic (implantable) cardiac defibrillator: Secondary | ICD-10-CM

## 2023-06-09 NOTE — Progress Notes (Signed)
EPIC Encounter for ICM Monitoring  Patient Name: [name] is a 57 y.o. male Date: 06/09/2023 Primary Care Physican: Etta Grandchild, MD Primary Cardiologist: Shirlee Latch Electrophysiologist: Townsend Roger Pacing: 97% 10/22/2022 Weight: 372 lbs   11/25/2022 Weight: 365 lbs   02/04/2023 Weight:  358 lbs       06/01/2023 Weight: 328 lbs (losing weight taking Mounjaro)   06/09/2023 Weight: 328 lbs                          Spoke with patient and heart failure questions reviewed.  Transmission results reviewed.  Pt asymptomatic for fluid accumulation.   He is inconsistent with fluid intake and some days does not drink a lot.   CorVue thoracic impedance suggesting pattern of possible fluid accumulation alternating with possible dryness.    Prescribed:  Torsemide 20 mg Take 1 tablet daily Potassium 20 mEq take 2 tablet(s) (40 mEq total) by mouth. Spironolactone 25 mg take 1 tablet daily   Labs: 02/06/2023 Creatinine 1.86, BUN 24, Potassium 4.2, Sodium 138, GFR 40.56 01/09/2023 Creatinine 1.68, BUN 18, Potassium 3.2, Sodium 139, GFR 48  12/23/2022 Creatinine 1.53, BUN 15, Potassium 3.9, Sodium 137, GFR 54  10/10/2022 Creatinine 1.74, BUN 16, Potassium 4.1, Sodium 140, GFR 46 09/25/2022 Creatinine 1.72, BUN 22, Potassium 3.8, Sodium 139, GFR 44.67  A complete set of results can be found in Results Review.   Recommendations:   Advised to keep fluid intake consistent and close to but not over 64 oz daily to stay hydrated.     Follow-up plan: ICM clinic phone appointment 07/06/2023.   91 day device clinic remote transmission 07/28/2023.     EP/Cardiology Office Visits:   06/16/2023 with HF clinic.  Recall 08/09/2023 with EP APP.   Copy of ICM check sent to Dr. Lalla Brothers.       3 month ICM trend: 06/08/2023.    12-14 Month ICM trend:     Karie Soda, RN 06/09/2023 12:30 PM
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
## 2023-06-15 ENCOUNTER — Telehealth (HOSPITAL_COMMUNITY): Payer: Self-pay

## 2023-06-15 NOTE — Telephone Encounter (Signed)
Called to confirm/remind patient of their appointment [facility] on [facility].   Patient reminded to bring all medications and/or complete list.  Confirmed patient has transportation. Gave directions, instructed to utilize valet parking.  Confirmed appointment prior to ending call.

## 2023-06-15 NOTE — Progress Notes (Signed)
[facility] Clinic Note   Primary Care: Dr. Sanda Linger HF Cardiologist: Dr. Shirlee Latch   HPI: [name] is a 57 y.o. male with a past medical history of NICM, EF 25-30% in March 2018, felt to be related to prior ETOH abuse. He also has a history of PE 03/2014 completed a years course of Xarelto, morbid obesity, OSA, and tobacco abuse.    He was admitted 11/12/15-11/14/15 with acute on chronic systolic CHF and palpitations. He wore a 30 day event monitor at discharge as he had frequent PVC's and questionable Afib on telemetry. Also with some NSVT, so he was started on Amiodaone, but at follow up had not started taking it. He had previously refused ICD and was seen inpatient by Dr. Johney Frame who felt that his morbid obesity was a prohibitive factor.    He was seen in the clinic in April 2018. He had started drinking ETOH again. Volume status was stable, he is not an Entresto candidate due to angioedema with lisinopril. Weight was 411 pounds.    Admitted 04/12/17 with SOB, chest pain. D- dimer was 1.16, chest CT without central obstructing PE, however more peripheral and subsegmental pulmonary artery branches were not confidently evaluated due to his body habitus. He was started on a heparin gtt for presumed PE, however VQ scan showed no PE. Troponin was elevated, peaked at 3.37. LHC showed no CAD. Echo showed an EF of 15%, grade 2 DD, no pericarditis. He was diuresed with IV lasix, and started on torsemide 20mg  at discharge. Discharge weight was 405 pounds.   Admitted 5/22 through 04/24/2018 with abdominal pain, nausea, and vomiting. 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 Did not require surgical intervention. He will have follow up with GI.   Echo in 2/21 with EF 20-25%, severe LV dilation.   Presented 05/09/21 w/ SOB 2/2 acute CHF w ? PNA.  CTA showed multifocal ?PNA, no PE. Also in Afib w/ RVR in 150s on admit. Initially required bipap, developed hemoptysis and intubated. Treated w/ IV Lasix. Echo  05/10/21: LVEF < 20%. RV moderately reduced. Improved and was extubated 6/13. Went into Afib w/ RVR. Refused to wear BiPAP. Developed severe agitation/hypoxia and re-intubated.  Went into VT>>VF arrest overnight ACLS>>ROSC after . Developed refractory AFib again next morning requiring emergent cardioversion 05/16/21. He was transitioned to PO amiodarone. HR remained in 40-50's, metoprolol and sildenafil were stopped. Discharge weight 367 lbs.  St Jude CRT-D device placed in 2/23.  Echo 8/23 showed EF <20%, mild MR, RV normal.  CPX in 9/23 showed moderate functional limitation due to HF and body habitus.   Follow up 11/23,  NYHA II and volume stable. Remained in NSR and amiodarone decreased to 100 mg daily.   Today he returns for HF follow up. Overall feeling fine. He now lives in [place], [place]. Has benodpnea, no SOB walking on flat ground. Denies abnormal bleeding, palpitations, CP, dizziness, edema, or PND/Orthopnea. Appetite ok. No fever or chills. Weight at home 377 pounds. Taking all medications but ran out of losartan. Wears BiPap, started back smoking 3 cigs/day, rare ETOH. Took 5 sildenafil tablets with no result.  St Jude device interrogation: 98% BiV pacing, no AF/VT, CorVue stable   ECG (personally reviewed): none ordered today.  Labs (1/19): K 3.8 Creatinine 1.25  Labs (2/19): K 3.8 Creatinine 1.08 Labs (4/19): K 4.1 creatinine 1/15 BNP 212  Labs (5/19): creatinine 1.1  Labs (10/19): LDL 166, K 3.8, creatinine 1.01, AST 102 => 25, ALT 125 => 37,  alkaline phosphatase 233 Labs (11/20): LDL 106, TGs 232 Labs (2/21): K 3.3, creatinine 1.3 Labs (4/21): LDL 114, TGs 466, K 3.6, creatinine 1.22 Labs (12/21): K 3.5, creatinine 1.08, LDL 164, TGs 133 Labs (722): K 3.8, creatinine 1.4 Labs (8/22): K 3.8, creatinine 1.23 Labs (9/22): K 3.6, creatinine 1.26 Labs (2/23): LFTs normal, TSH normal, K 4, creatinine 1.3 Labs (8/23): K 3.4, creatinine 1.49 Labs (10/23): K 3.8, creatinine  1.72 Labs (11/23): LFTs normal, TSH normal Labs (1/24): K 3.9, creatinine 1.53  PMH: 1. Nonischemic cardiomyopathy: Prior cath with no significant CAD.  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  Prior echoes with EF as low as 25%.  Echo (9/13) with EF 35-40%, moderate to severe LV dilation, diffuse hypokinesis, mild MR. Echo (5/15) with EF 30-35%, moderate to severe LAE, normal RV size and systolic function.  Angioedema with ACEI, headaches with hydralazine/nitrates. Echo (3/16) with EF 25-30%, severe LV dilation, normal RV size and systolic function.  [facility] 08/13/15 showed no significant coronary disease; RA mean 6, PA 33/11 mean 23, PCWP mean 13, Fick CO/CI 4.75 /1.68 (difficult study, radial artery spasm, if needs future cath would use groin).  Echo (9/16) showed EF 20-25%.   - Echo (3/18): EF 25-30%, moderate LAE - Echo 4/18 EF 15% - Echo 7/19 EF 20-25% - Echo 2/21 with EF 20-25%, severe LV dilation.  - Echo 6/22 with EF < 20%, normal RV. - St Jude CRT-D 2/23.  - Echo (8/23): EF <20%, mild MR, RV normal.  - CPX (9/23): Peak VO2 13.1, VE/VCO2 slope 37, RER 1.10.  Moderate functional limitation, body habitus and HF.  2. HTN: angioedema with ACEI.  3. OSA: on Bipap 4. Morbid obesity 5. Paroxysmal atrial fibrillation: Urgent DCCV 6/22. 6. Smoker.  7. Anxiety/panic attacks 8. PE: 5/15, diagnosed by V/Q scan. CTA chest 8/16 negative for PE.  9. NSVT, PVCs: 30 day monitor (12/16) with PVCs, PACs, no atrial fibrillation.  - Zio patch (9/19): few short NSVT runs, no atrial fibrillation, 1.1% PVCs 10. Hematuria: Apparently had negative workup by urology.  11. ABIs (6/16) were normal 12. 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  13. Gout 14. Low back pain.  15. 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 CKD stage 3 17. Type 2 diabetes   SH: Separated, lives in [name], drinks ETOH occasionally, no drugs. Prior smoker.  Has son and daughter.     FH: No premature CAD.     Review of systems complete and found to be negative unless listed in HPI.   Current Outpatient Medications  Medication Sig Dispense Refill   albuterol (VENTOLIN HFA) 108 (90 Base) MCG/ACT inhaler INHALE 1-2 PUFFS INTO THE LUNGS EVERY 4 (FOUR) HOURS AS NEEDED FOR SHORTNESS OF BREATH OR WHEEZING. 8.5 each 1   amiodarone (PACERONE) 200 MG tablet Take 0.5 tablets (100 mg total) by mouth daily. Patient taking once daily 90 tablet 1   ARIPiprazole (ABILIFY) 15 MG tablet Take 1 tablet (15 mg total) by mouth daily. 90 tablet 0   atorvastatin (LIPITOR) 40 MG tablet Take 1 tablet (40 mg total) by mouth daily. 90 tablet 3   buPROPion (WELLBUTRIN XL) 150 MG 24 hr tablet Take 1 tablet (150 mg total) by mouth daily. 90 tablet 0   carvedilol (COREG) 6.25 MG tablet Take 1 tablet (6.25 mg total) by mouth 2 (two) times daily. 60 tablet 11   clonazePAM (KLONOPIN) 1 MG tablet Take 0.5 tablets (0.5 mg total) by mouth daily.  May also take 1 tablet (1 mg total) at bedtime as needed for anxiety. 45 tablet 1   clonazePAM (KLONOPIN) 1 MG tablet Take 1 tablet (1 mg total) by mouth daily as needed for anxiety. 30 tablet 2   diclofenac Sodium (VOLTAREN) 1 % GEL Apply 2 g topically 4 (four) times daily. 100 g 0   FARXIGA 10 MG TABS tablet TAKE 1 TABLET BY MOUTH EVERY DAY 90 tablet 1   losartan (COZAAR) 25 MG tablet Take 1 tablet (25 mg total) by mouth daily. 90 tablet 3   mirtazapine (REMERON) 15 MG tablet Take 1 tablet (15 mg total) by mouth at bedtime. 30 tablet 1   pantoprazole (PROTONIX) 40 MG tablet Take 1 tablet (40 mg total) by mouth daily. 90 tablet 0   potassium chloride SA (KLOR-CON M) 20 MEQ tablet Take 2 tablets (40 mEq total) by mouth daily. 180 tablet 3   PRESCRIPTION MEDICATION Inhale into the lungs See admin instructions. Bipap, pressure 16/12 with 2L of O2 - use whenever sleeping     sildenafil (REVATIO) 20 MG tablet TAKE 1 TABLET BY MOUTH AS NEEDED, MAY take UP TO FOUR TABLETS AS NEEDED 20 tablet 0    sildenafil (VIAGRA) 50 MG tablet Take 1 tablet by mouth daily as needed for erectile dysfunction (begin with one tablet --may take 2 if necessary). 15 tablet 5   spironolactone (ALDACTONE) 25 MG tablet Take 1 tablet (25 mg total) by mouth at bedtime. 90 tablet 2   tirzepatide (MOUNJARO) 10 MG/0.5ML Pen Inject 10 mg into the skin once a week. 2 mL 11   torsemide (DEMADEX) 20 MG tablet Take 1 tablet (20 mg total) by mouth daily. 90 tablet 3   XARELTO 20 MG TABS tablet TAKE 1 TABLET BY MOUTH DAILY WITH SUPPER 90 tablet 1   No current facility-administered medications for this visit.   Allergies  Allergen Reactions   Ace Inhibitors Anaphylaxis and Swelling    Angioedema   Bidil [Isosorb Dinitrate-Hydralazine] Other (See Comments)    headache   Digoxin And Related     Unspecified "side effects"   Buspirone Other (See Comments)    dizziness   Social History   Socioeconomic History   Marital status: Married    Spouse name: Not on file   Number of children: 3   Years of education: Not on file   Highest education level: Not on file  Occupational History   Occupation: DISABLED  Tobacco Use   Smoking status: Some Days    Current packs/day: 0.00    Average packs/day: 0.5 packs/day for 30.0 years (15.0 ttl pk-yrs)    Types: Cigarettes    Start date: 05/15/1990    Last attempt to quit: 05/15/2020    Years since quitting: 3.0   Smokeless tobacco: Never   Tobacco comments:    vaping - nicotine-free products  Vaping Use   Vaping status: Never Used  Substance and Sexual Activity   Alcohol use: Not Currently    Alcohol/week: 0.0 standard drinks of alcohol   Drug use: No   Sexual activity: Not Currently  Other Topics Concern   Not on file  Social History Narrative   He smokes about a pack per day and he has been smoking since he was 57 years of age.  He drinks alcohol occasionally, but he denies any illicit drug abuse.  He is presently on disability.    Lives with wife in a 2 story home.   Has 2 children.  Previously worked in Office manager, last worked in 1998.   Highest level of education:  11th grade           Social Determinants of Health   Financial Resource Strain: Low Risk  (08/26/2022)   Overall Financial Resource Strain (CARDIA)    Difficulty of Paying Living Expenses: Not very hard  Food Insecurity: No Food Insecurity (08/26/2022)   Hunger Vital Sign    Worried About Running Out of Food in the Last Year: Never true    Ran Out of Food in the Last Year: Never true  Transportation Needs: No Transportation Needs (08/26/2022)   PRAPARE - Administrator, Civil Service (Medical): No    Lack of Transportation (Non-Medical): No  Physical Activity: Sufficiently Active (08/26/2022)   Exercise Vital Sign    Days of Exercise per Week: 5 days    Minutes of Exercise per Session: 30 min  Stress: No Stress Concern Present (08/26/2022)   [employer] of Occupational Health - Occupational Stress Questionnaire    Feeling of Stress : Only a little  Social Connections: Socially Isolated (08/26/2022)   Social Connection and Isolation Panel [NHANES]    Frequency of Communication with Friends and Family: Once a week    Frequency of Social Gatherings with Friends and Family: Never    Attends Religious Services: Never    Database administrator or Organizations: No    Attends Banker Meetings: Never    Marital Status: Separated  Intimate Partner Violence: Not At Risk (08/26/2022)   Humiliation, Afraid, Rape, and Kick questionnaire    Fear of Current or Ex-Partner: No    Emotionally Abused: No    Physically Abused: No    Sexually Abused: No   Family History  Problem Relation Age of Onset   Cancer Mother        brain tumor   Hypertension Mother    Diabetes Father        Deceased, 79   Heart disease Maternal Grandmother    Hypertension Other        Family History   Stroke Other        Family History   Diabetes Other        Family History   Diabetes  Daughter    There were no vitals taken for this visit.  Wt Readings from Last 3 Encounters:  02/10/23 (!) 162.7 kg (358 lb 9.6 oz)  02/05/23 (!) 163.3 kg (360 lb)  01/09/23 (!) 166.7 kg (367 lb 9.6 oz)   PHYSICAL EXAM: General:  NAD. No resp difficulty, walked into clinic HEENT: Normal Neck: Supple. No JVD, thick neck. Carotids 2+ bilat; no bruits. No lymphadenopathy or thryomegaly appreciated. Cor: PMI nondisplaced. Regular rate & rhythm. No rubs, gallops or murmurs. Lungs: Clear Abdomen: Soft, obese, nontender, nondistended. No hepatosplenomegaly. No bruits or masses. Good bowel sounds. Extremities: No cyanosis, clubbing, rash, edema Neuro: Alert & oriented x 3, cranial nerves grossly intact. Moves all 4 extremities w/o difficulty. Affect pleasant.  ASSESSMENT & PLAN: 1. Atrial fibrillation: Paroyxsmal. Emergent DCCV in 6/22. Regular on exam today. - Continue amiodarone 100 mg daily.  Recent LFTs and TSH normal.  Needs regular eye exam.  - Continue Xarelto. No bleeding issues. CBC today. - Based on body habitus, not good ablation candidate.  2.  Chronic systolic CHF: Nonischemic cardiomyopathy. ?If ETOH has played a role (now drinks rarely). Echo (3/18) with EF 25-30%. Echo 7/19 and in 2/21 with EF 20-25%. Echo 6/22  with EF < 20%, normal RV.  Has St Jude CRT-D device now.  Echo-post CRT (8/23) showed EF <20%, mild MR, RV normal.  CPX in 9/23 with moderate functional limitation due to HF and body habitus.  NYHA class II, not volume overloaded by exam or device interrogation. He is enrolled in monthly fluid monitoring. - Restart losartan 25 mg daily. BMET/BNP today, repeat BMET in 10-14 days. - Continue torsemide 60 daily alternating with 40 mg daily w/ 40 KCL daily.   - Continue spironolactone 25 mg daily. - Continue Farxiga 10 mg daily. - Continue Coreg 6.25 mg bid.   - No Entresto, ACEI with h/o angioedema.  - Headaches with Bidil, cannot take.  - He did not tolerate digoxin.  -  has not wanted to do cardiac rehab.  - Insurance denied baroreceptor activation therapy and he has CRT now.  - I am concerned about him long-term, he is too heavy for heart transplant and needs weight loss even to get him to LVAD.    3. HTN: BP stable. Med changes as above.  4. 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  Has St Jude CRT-D device now.  5. OSA: Continue nightly BiPAP. 6. PE: 04/14/2018, diagnosed by V/Q scan. Has been on Xarelto. No bleeding issues. 7. Anxiety/Panic attacks/depression: Followed by psychiatry. 8. Hyperlipidemia: Continue atorvastatin.   9. Obesity: There is no height or weight on file to calculate BMI.  - He is now on [facility] - [facility].  - Has not wanted to do cardiac rehab.   10. Tobacco use: Back smoking. Discussed cessation. 11. ED: Needs follow up with PCP or Urology as he reports unresponsive to high-dose sildenafil.  Follow up in 3 months with Dr. Shirlee Latch.  Anderson [facility] FNP 06/15/2023

## 2023-06-16 ENCOUNTER — Ambulatory Visit (HOSPITAL_COMMUNITY)
Admission: RE | Admit: 2023-06-16 | Discharge: 2023-06-16 | Disposition: A | Discharge status: Home / Self Care | Payer: Medicare HMO | Source: Ambulatory Visit | Attending: Family Medicine | Admitting: Family Medicine

## 2023-06-16 ENCOUNTER — Encounter (HOSPITAL_COMMUNITY): Payer: Self-pay

## 2023-06-16 VITALS — BP 110/72 | HR 80 | Ht 69.0 in | Wt 329.4 lb

## 2023-06-16 DIAGNOSIS — Z7984 Long term (current) use of oral hypoglycemic drugs: Secondary | ICD-10-CM | POA: Diagnosis not present

## 2023-06-16 DIAGNOSIS — Z79899 Other long term (current) drug therapy: Secondary | ICD-10-CM | POA: Insufficient documentation

## 2023-06-16 DIAGNOSIS — F17210 Nicotine dependence, cigarettes, uncomplicated: Secondary | ICD-10-CM | POA: Diagnosis not present

## 2023-06-16 DIAGNOSIS — F419 Anxiety disorder, unspecified: Secondary | ICD-10-CM | POA: Diagnosis not present

## 2023-06-16 DIAGNOSIS — I428 Other cardiomyopathies: Secondary | ICD-10-CM | POA: Diagnosis not present

## 2023-06-16 DIAGNOSIS — E118 Type 2 diabetes mellitus with unspecified complications: Secondary | ICD-10-CM | POA: Diagnosis not present

## 2023-06-16 DIAGNOSIS — Z720 Tobacco use: Secondary | ICD-10-CM

## 2023-06-16 DIAGNOSIS — I5022 Chronic systolic (congestive) heart failure: Secondary | ICD-10-CM | POA: Diagnosis not present

## 2023-06-16 DIAGNOSIS — I10 Essential (primary) hypertension: Secondary | ICD-10-CM

## 2023-06-16 DIAGNOSIS — Z7901 Long term (current) use of anticoagulants: Secondary | ICD-10-CM | POA: Insufficient documentation

## 2023-06-16 DIAGNOSIS — G4733 Obstructive sleep apnea (adult) (pediatric): Secondary | ICD-10-CM | POA: Diagnosis not present

## 2023-06-16 DIAGNOSIS — I5023 Acute on chronic systolic (congestive) heart failure: Secondary | ICD-10-CM | POA: Insufficient documentation

## 2023-06-16 DIAGNOSIS — I480 Paroxysmal atrial fibrillation: Secondary | ICD-10-CM

## 2023-06-16 DIAGNOSIS — Z86711 Personal history of pulmonary embolism: Secondary | ICD-10-CM | POA: Diagnosis not present

## 2023-06-16 DIAGNOSIS — E785 Hyperlipidemia, unspecified: Secondary | ICD-10-CM | POA: Diagnosis not present

## 2023-06-16 DIAGNOSIS — I472 Ventricular tachycardia, unspecified: Secondary | ICD-10-CM | POA: Insufficient documentation

## 2023-06-16 DIAGNOSIS — R42 Dizziness and giddiness: Secondary | ICD-10-CM | POA: Insufficient documentation

## 2023-06-16 DIAGNOSIS — Z6841 Body Mass Index (BMI) 40.0 and over, adult: Secondary | ICD-10-CM | POA: Diagnosis not present

## 2023-06-16 DIAGNOSIS — I130 Hypertensive heart and chronic kidney disease with heart failure and stage 1 through stage 4 chronic kidney disease, or unspecified chronic kidney disease: Secondary | ICD-10-CM | POA: Diagnosis not present

## 2023-06-16 DIAGNOSIS — Z716 Tobacco abuse counseling: Secondary | ICD-10-CM | POA: Insufficient documentation

## 2023-06-16 LAB — CBC
HCT: 41.7 % (ref 39.0–52.0)
Hemoglobin: 14.5 g/dL (ref 13.0–17.0)
MCH: 27.8 pg (ref 26.0–34.0)
MCHC: 34.8 g/dL (ref 30.0–36.0)
MCV: 79.9 fL — ABNORMAL LOW (ref 80.0–100.0)
Platelets: 264 10*3/uL (ref 150–400)
RBC: 5.22 MIL/uL (ref 4.22–5.81)
RDW: 14.7 % (ref 11.5–15.5)
WBC: 6.2 10*3/uL (ref 4.0–10.5)
nRBC: 0 % (ref 0.0–0.2)

## 2023-06-16 LAB — BASIC METABOLIC PANEL
Anion gap: 7 (ref 5–15)
BUN: 13 mg/dL (ref 6–20)
CO2: 22 mmol/L (ref 22–32)
Calcium: 9 mg/dL (ref 8.9–10.3)
Chloride: 107 mmol/L (ref 98–111)
Creatinine, Ser: 1.1 mg/dL (ref 0.61–1.24)
GFR, Estimated: >60 mL/min (ref >60)
Glucose, Bld: 84 mg/dL (ref 70–99)
Potassium: 3.5 mmol/L (ref 3.5–5.1)
Sodium: 136 mmol/L (ref 135–145)

## 2023-06-16 LAB — FERRITIN: Ferritin: 126 ng/mL (ref 24–336)

## 2023-06-16 LAB — IRON AND TIBC
Iron: 39 ug/dL — ABNORMAL LOW (ref 45–182)
Saturation Ratios: 12 % — ABNORMAL LOW (ref 17.9–39.5)
TIBC: 328 ug/dL (ref 250–450)
UIBC: 289 ug/dL

## 2023-06-16 LAB — BRAIN NATRIURETIC PEPTIDE: B Natriuretic Peptide: 432.2 pg/mL — ABNORMAL HIGH (ref 0.0–100.0)

## 2023-06-16 MED ORDER — CARVEDILOL 6.25 MG PO TABS: 6.2500 mg | ORAL_TABLET | Freq: Two times a day (BID) | ORAL | 11 refills | Status: DC

## 2023-06-16 MED ORDER — POTASSIUM CHLORIDE CRYS ER 20 MEQ PO TBCR: 40.0000 meq | EXTENDED_RELEASE_TABLET | Freq: Every day | ORAL | 3 refills | Status: DC

## 2023-06-16 MED ORDER — DAPAGLIFLOZIN PROPANEDIOL 10 MG PO TABS
10.0000 mg | ORAL_TABLET | Freq: Every day | ORAL | 1 refills | Status: DC
Start: 2023-06-16 — End: 2024-10-05

## 2023-06-16 MED ORDER — TORSEMIDE 20 MG PO TABS: ORAL_TABLET | ORAL | 6 refills | Status: DC

## 2023-06-16 MED ORDER — LOSARTAN POTASSIUM 25 MG PO TABS: 25.0000 mg | ORAL_TABLET | Freq: Every day | ORAL | 3 refills | Status: DC

## 2023-06-16 MED ORDER — ATORVASTATIN CALCIUM 40 MG PO TABS
40.0000 mg | ORAL_TABLET | Freq: Every day | ORAL | 3 refills | Status: DC
Start: 2023-06-16 — End: 2024-09-30

## 2023-06-16 MED ORDER — SPIRONOLACTONE 25 MG PO TABS
25.0000 mg | ORAL_TABLET | Freq: Every evening | ORAL | 3 refills | Status: DC
Start: 2023-06-16 — End: 2024-08-15

## 2023-06-16 NOTE — Patient Instructions (Signed)
Thank you for coming in today  If you had labs drawn today, any labs that are abnormal the clinic will call you No news is good news  Medications: Increase Torsemide to 40 mg daily alternating with 20 mg every other day  Follow up appointments: Your physician recommends that you return for lab work in: 10-14 days BMET  Your physician recommends that you schedule a follow-up appointment in:  2 months With Dr. Shirlee Latch echocardiogram  Your physician has requested that you have an echocardiogram. Echocardiography is a painless test that uses sound waves to create images of your heart. It provides your doctor with information about the size and shape of your heart and how well your heart's chambers and valves are working. This procedure takes approximately one hour. There are no restrictions for this procedure.      Do the following things EVERYDAY: Weigh yourself in the morning before breakfast. Write it down and keep it in a log. Take your medicines as prescribed Eat low salt foods--Limit salt (sodium) to 2000 mg per day.  Stay as active as you can everyday Limit all fluids for the day to less than 2 liters   At the [facility], you and your health needs are our priority. As part of our continuing mission to provide you with exceptional heart care, we have created designated Provider Care Teams. These Care Teams include your primary Cardiologist (physician) and Advanced Practice Providers (APPs- Physician Assistants and Nurse Practitioners) who all work together to provide you with the care you need, when you need it.   You may see any of the following providers on your designated Care Team at your next follow up: Dr Arvilla Meres Dr Marca Ancona Dr. Marcos Eke, NP Robbie Lis, [place] [facility] [place], [place] Brynda Peon, NP Karle Plumber, PharmD   Please be sure to bring in all your medications bottles to every appointment.     Thank you for choosing [facility]  If you have any questions or concerns before your next appointment please send [place] a message through [place] or call our office at [phone].    TO LEAVE A MESSAGE FOR THE NURSE SELECT OPTION 2, PLEASE LEAVE A MESSAGE INCLUDING: YOUR NAME DATE OF BIRTH CALL BACK NUMBER REASON FOR CALL**this is important as we prioritize the call backs  YOU WILL RECEIVE A CALL BACK THE SAME DAY AS LONG AS YOU CALL BEFORE 4:00 PM

## 2023-06-22 ENCOUNTER — Ambulatory Visit (INDEPENDENT_AMBULATORY_CARE_PROVIDER_SITE_OTHER): Payer: Medicare HMO | Admitting: Psychiatry

## 2023-06-22 ENCOUNTER — Encounter: Payer: Self-pay | Admitting: Psychiatry

## 2023-06-22 VITALS — BP 107/70 | HR 79 | Temp 92.2°F | Ht 69.0 in | Wt 321.0 lb

## 2023-06-22 DIAGNOSIS — F4310 Post-traumatic stress disorder, unspecified: Secondary | ICD-10-CM

## 2023-06-22 DIAGNOSIS — F410 Panic disorder [episodic paroxysmal anxiety] without agoraphobia: Secondary | ICD-10-CM | POA: Diagnosis not present

## 2023-06-22 DIAGNOSIS — F3132 Bipolar disorder, current episode depressed, moderate: Secondary | ICD-10-CM

## 2023-06-22 DIAGNOSIS — G4700 Insomnia, unspecified: Secondary | ICD-10-CM

## 2023-06-22 MED ORDER — TRAZODONE HCL 50 MG PO TABS: 25.0000 mg | ORAL_TABLET | Freq: Every evening | ORAL | 1 refills | Status: DC | PRN

## 2023-06-22 MED ORDER — ARIPIPRAZOLE 5 MG PO TABS: 5.0000 mg | ORAL_TABLET | Freq: Every day | ORAL | 1 refills | Status: DC

## 2023-06-25 ENCOUNTER — Ambulatory Visit: Payer: Medicare HMO | Admitting: Internal Medicine

## 2023-06-25 ENCOUNTER — Encounter: Payer: Self-pay | Admitting: Internal Medicine

## 2023-06-25 ENCOUNTER — Telehealth: Payer: Self-pay | Admitting: Radiology

## 2023-06-25 VITALS — BP 104/66 | HR 93 | Temp 97.9°F | Ht 69.0 in | Wt 317.0 lb

## 2023-06-25 DIAGNOSIS — K047 Periapical abscess without sinus: Secondary | ICD-10-CM | POA: Insufficient documentation

## 2023-06-25 DIAGNOSIS — Z86711 Personal history of pulmonary embolism: Secondary | ICD-10-CM

## 2023-06-25 DIAGNOSIS — E118 Type 2 diabetes mellitus with unspecified complications: Secondary | ICD-10-CM | POA: Diagnosis not present

## 2023-06-25 DIAGNOSIS — G4733 Obstructive sleep apnea (adult) (pediatric): Secondary | ICD-10-CM | POA: Diagnosis not present

## 2023-06-25 DIAGNOSIS — B3742 Candidal balanitis: Secondary | ICD-10-CM

## 2023-06-25 DIAGNOSIS — E611 Iron deficiency: Secondary | ICD-10-CM | POA: Diagnosis not present

## 2023-06-25 DIAGNOSIS — R0602 Shortness of breath: Secondary | ICD-10-CM

## 2023-06-25 DIAGNOSIS — I480 Paroxysmal atrial fibrillation: Secondary | ICD-10-CM

## 2023-06-25 DIAGNOSIS — I428 Other cardiomyopathies: Secondary | ICD-10-CM | POA: Diagnosis not present

## 2023-06-25 DIAGNOSIS — R0609 Other forms of dyspnea: Secondary | ICD-10-CM | POA: Diagnosis not present

## 2023-06-25 DIAGNOSIS — I2699 Other pulmonary embolism without acute cor pulmonale: Secondary | ICD-10-CM | POA: Diagnosis not present

## 2023-06-25 LAB — CBC WITH DIFFERENTIAL/PLATELET
Basophils Absolute: 0.1 10*3/uL (ref 0.0–0.1)
Basophils Relative: 1.1 % (ref 0.0–3.0)
Eosinophils Absolute: 0.1 10*3/uL (ref 0.0–0.7)
Eosinophils Relative: 0.8 % (ref 0.0–5.0)
HCT: 50.5 % (ref 39.0–52.0)
Hemoglobin: 16.8 g/dL (ref 13.0–17.0)
Lymphocytes Relative: 38.6 % (ref 12.0–46.0)
Lymphs Abs: 3.4 10*3/uL (ref 0.7–4.0)
MCHC: 33.3 g/dL (ref 30.0–36.0)
MCV: 84.4 fl (ref 78.0–100.0)
Monocytes Absolute: 1 10*3/uL (ref 0.1–1.0)
Monocytes Relative: 11.6 % (ref 3.0–12.0)
Neutro Abs: 4.2 10*3/uL (ref 1.4–7.7)
Neutrophils Relative %: 47.9 % (ref 43.0–77.0)
Platelets: 260 10*3/uL (ref 150.0–400.0)
RBC: 5.98 Mil/uL — ABNORMAL HIGH (ref 4.22–5.81)
RDW: 16.1 % — ABNORMAL HIGH (ref 11.5–15.5)
WBC: 8.7 10*3/uL (ref 4.0–10.5)

## 2023-06-25 LAB — D-DIMER, QUANTITATIVE: D-Dimer, Quant: 1.51 mcg/mL FEU — ABNORMAL HIGH (ref <0.50)

## 2023-06-25 LAB — BRAIN NATRIURETIC PEPTIDE: Pro B Natriuretic peptide (BNP): 241 pg/mL — ABNORMAL HIGH (ref 0.0–100.0)

## 2023-06-25 LAB — TROPONIN I (HIGH SENSITIVITY): High Sens Troponin I: 25 ng/L (ref 2–17)

## 2023-06-25 MED ORDER — AMOXICILLIN-POT CLAVULANATE 875-125 MG PO TABS
1.0000 | ORAL_TABLET | Freq: Two times a day (BID) | ORAL | 0 refills | Status: AC
Start: 2023-06-25 — End: 2023-07-05

## 2023-06-25 MED ORDER — XARELTO 20 MG PO TABS
20.0000 mg | ORAL_TABLET | Freq: Every day | ORAL | 1 refills | Status: DC
Start: 2023-06-25 — End: 2023-11-15

## 2023-06-25 MED ORDER — FLUCONAZOLE 150 MG PO TABS
150.0000 mg | ORAL_TABLET | Freq: Once | ORAL | 1 refills | Status: AC
Start: 2023-06-25 — End: 2023-06-25

## 2023-06-25 MED ORDER — ACCRUFER 30 MG PO CAPS
1.0000 | ORAL_CAPSULE | Freq: Two times a day (BID) | ORAL | 0 refills | Status: DC
Start: 2023-06-25 — End: 2023-07-30

## 2023-06-25 MED ORDER — OXYCODONE HCL 5 MG PO TABS
5.0000 mg | ORAL_TABLET | Freq: Four times a day (QID) | ORAL | 0 refills | Status: AC | PRN
Start: 2023-06-25 — End: 2023-07-03

## 2023-06-25 NOTE — Progress Notes (Signed)
Subjective:  Patient ID: [name], male    DOB: November 26, 1966  Age: 57 y.o. MRN: [id number]  CC: Congestive Heart Failure and Diabetes   HPI [name] presents for f/up ---  Discussed the use of AI scribe software for clinical note transcription with the patient, who gave verbal consent to proceed.  History of Present Illness   The patient, with a history of heart disease and a pacemaker, presents with complaints of insomnia, dizziness, and significant weight loss. They report a recent prescription for Trazodone, but have not started it due to concerns about potential interactions with their current medication, aminoronadone.  They also report a recent diagnosis of iron deficiency, but deny any known blood loss. Despite this, they have not yet received an iron infusion. They deny any symptoms suggestive of gastrointestinal bleeding, such as hematochezia or melena. They also deny any abdominal pain or dysphagia.  The patient has been experiencing significant weight loss, dropping from 322 to 317 pounds within a short period. They attribute this to a decreased appetite and reduced food intake, which they believe is a side effect of their Mounjaro injections.  They also mention a recent oral abscess, which they believe has ruptured. They deny any chest pain but do report occasional shortness of breath. They have not taken their blood pressure medication on the day of the consultation.  The patient also reports a growth on their genitals, but no further details are provided. They deny any foot pain, but note that one of their toenails is swollen.       Outpatient Medications Prior to Visit  Medication Sig Dispense Refill   albuterol (VENTOLIN HFA) 108 (90 Base) MCG/ACT inhaler INHALE 1-2 PUFFS INTO THE LUNGS EVERY 4 (FOUR) HOURS AS NEEDED FOR SHORTNESS OF BREATH OR WHEEZING. 8.5 each 1   amiodarone (PACERONE) 200 MG tablet Take 0.5 tablets (100 mg total) by mouth daily. Patient  taking once daily 90 tablet 1   ARIPiprazole (ABILIFY) 5 MG tablet Take 1 tablet (5 mg total) by mouth daily. 30 tablet 1   atorvastatin (LIPITOR) 40 MG tablet Take 1 tablet (40 mg total) by mouth daily. 90 tablet 3   carvedilol (COREG) 6.25 MG tablet Take 1 tablet (6.25 mg total) by mouth 2 (two) times daily. 60 tablet 11   dapagliflozin propanediol (FARXIGA) 10 MG TABS tablet Take 1 tablet (10 mg total) by mouth daily. 90 tablet 1   diclofenac Sodium (VOLTAREN) 1 % GEL Apply 2 g topically 4 (four) times daily. 100 g 0   losartan (COZAAR) 25 MG tablet Take 1 tablet (25 mg total) by mouth daily. 90 tablet 3   pantoprazole (PROTONIX) 40 MG tablet Take 1 tablet (40 mg total) by mouth daily. 90 tablet 0   potassium chloride SA (KLOR-CON M) 20 MEQ tablet Take 2 tablets (40 mEq total) by mouth daily. 180 tablet 3   PRESCRIPTION MEDICATION Inhale into the lungs See admin instructions. Bipap, pressure 16/12 with 2L of O2 - use whenever sleeping     sildenafil (REVATIO) 20 MG tablet TAKE 1 TABLET BY MOUTH AS NEEDED, MAY take UP TO FOUR TABLETS AS NEEDED 20 tablet 0   sildenafil (VIAGRA) 50 MG tablet Take 1 tablet by mouth daily as needed for erectile dysfunction (begin with one tablet --may take 2 if necessary). 15 tablet 5   spironolactone (ALDACTONE) 25 MG tablet Take 1 tablet (25 mg total) by mouth at bedtime. 90 tablet 3   tirzepatide [facility])  10 MG/0.5ML Pen Inject 10 mg into the skin once a week. 2 mL 11   torsemide (DEMADEX) 20 MG tablet Take 2 tablets (40 mg total) by mouth See admin instructions AND 1 tablet (20 mg total) See admin instructions. Alternate 40 mg every other day when 20 mg every other day. 60 tablet 6   traZODone (DESYREL) 50 MG tablet Take 0.5-1 tablets (25-50 mg total) by mouth at bedtime as needed for sleep. 30 tablet 1   XARELTO 20 MG TABS tablet TAKE 1 TABLET BY MOUTH DAILY WITH SUPPER 90 tablet 1   No facility-administered medications prior to visit.    ROS Review of  Systems  Constitutional:  Positive for appetite change, diaphoresis, fatigue and unexpected weight change (wt loss). Negative for activity change, chills and fever.  HENT:  Positive for dental problem.        Left upper tooth pain and swelling for 3 days  Eyes:  Negative for visual disturbance.  Respiratory:  Positive for shortness of breath. Negative for cough, chest tightness and wheezing.   Cardiovascular:  Positive for leg swelling. Negative for chest pain and palpitations.  Gastrointestinal:  Negative for abdominal pain, blood in stool, constipation, diarrhea and vomiting.  Genitourinary:  Negative for decreased urine volume, difficulty urinating, dysuria, flank pain, penile discharge, penile pain and scrotal swelling.  Musculoskeletal: Negative.   Skin: Negative.  Negative for rash.  Neurological: Negative.  Negative for dizziness and weakness.  Hematological:  Negative for adenopathy. Does not bruise/bleed easily.  Psychiatric/Behavioral:  Positive for decreased concentration, dysphoric mood and sleep disturbance. Negative for confusion and self-injury. The patient is nervous/anxious.     Objective:  BP 104/66 (BP Location: Right Arm, Patient Position: Sitting, Cuff Size: Large)   Pulse 93   Temp 97.9 F (36.6 C) (Oral)   Ht 5\' 9"  (1.753 m)   Wt (!) 317 lb (143.8 kg)   SpO2 97%   BMI 46.81 kg/m   BP Readings from Last 3 Encounters:  06/25/23 104/66  06/16/23 110/72  02/10/23 134/82    Wt Readings from Last 3 Encounters:  06/25/23 (!) 317 lb (143.8 kg)  06/16/23 (!) 329 lb 6.4 oz (149.4 kg)  02/10/23 (!) 358 lb 9.6 oz (162.7 kg)    Physical Exam Vitals reviewed.  Constitutional:      General: He is not in acute distress.    Appearance: He is ill-appearing. He is not toxic-appearing or diaphoretic.  HENT:     Head:     Comments: Tooth #15 has an apical abscess with an area of swelling, tenderness, and fluctuance.    Nose: Nose normal.     Mouth/Throat:     Mouth:  Mucous membranes are moist.  Eyes:     General: No scleral icterus.    Conjunctiva/sclera: Conjunctivae normal.  Cardiovascular:     Rate and Rhythm: Normal rate and regular rhythm.     Heart sounds: No murmur heard.    No friction rub. Gallop present.  Pulmonary:     Effort: Pulmonary effort is normal.     Breath sounds: No stridor. No wheezing, rhonchi or rales.  Abdominal:     General: Abdomen is protuberant. There is no distension.     Palpations: There is no hepatomegaly, splenomegaly or mass.     Tenderness: There is no abdominal tenderness.     Hernia: There is no hernia in the left inguinal area or right inguinal area.  Genitourinary:    Pubic Area: No  rash.      Penis: Uncircumcised. Erythema and discharge present. No phimosis, paraphimosis, hypospadias, tenderness, swelling or lesions.      Testes: Normal.     Epididymis:     Right: Normal.     Left: Normal.     Prostate: Normal. Not enlarged, not tender and no nodules present.     Rectum: Normal. Guaiac result negative. No mass, tenderness, anal fissure, external hemorrhoid or internal hemorrhoid. Normal anal tone.     Comments: White exudate over the base of the glans Musculoskeletal:     Cervical back: Neck supple.     Right lower leg: Edema present.     Left lower leg: Edema present.  Lymphadenopathy:     Cervical: No cervical adenopathy.     Lower Body: No right inguinal adenopathy. No left inguinal adenopathy.  Skin:    General: Skin is warm and dry.     Findings: No rash.     Comments: clammy  Neurological:     General: No focal deficit present.     Mental Status: He is alert. Mental status is at baseline.  Psychiatric:        Mood and Affect: Mood normal.        Behavior: Behavior normal.     Lab Results  Component Value Date   WBC 8.7 06/25/2023   HGB 16.8 06/25/2023   HCT 50.5 06/25/2023   PLT 260.0 06/25/2023   GLUCOSE 84 06/16/2023   CHOL 132 02/05/2023   TRIG 162.0 (H) 02/05/2023   HDL  34.50 (L) 02/05/2023   LDLDIRECT 114.0 03/21/2020   LDLCALC 65 02/05/2023   ALT 18 02/05/2023   AST 16 02/05/2023   NA 136 06/16/2023   K 3.5 06/16/2023   CL 107 06/16/2023   CREATININE 1.10 06/16/2023   BUN 13 06/16/2023   CO2 22 06/16/2023   TSH 2.98 02/05/2023   PSA 0.51 02/05/2023   INR 1.5 (H) 09/25/2022   HGBA1C 5.5 06/26/2023   MICROALBUR 1.2 02/05/2023    No results found.  Assessment & Plan:  Iron deficiency -     CBC with Differential/Platelet; Future -     ACCRUFeR; Take 1 capsule (30 mg total) by mouth in the morning and at bedtime.  Dispense: 180 capsule; Refill: 0  DOE (dyspnea on exertion)- His enzymes are reassuring. -     Troponin I (High Sensitivity); Future -     Brain natriuretic peptide; Future -     D-dimer, quantitative; Future  Nonischemic cardiomyopathy (HCC) -     Troponin I (High Sensitivity); Future -     Brain natriuretic peptide; Future  PE (pulmonary thromboembolism) (HCC) -     D-dimer, quantitative; Future  Shortness of breath -     Troponin I (High Sensitivity); Future -     Brain natriuretic peptide; Future -     D-dimer, quantitative; Future  Type II diabetes mellitus with manifestations (HCC) -     Hemoglobin A1c; Future  Candidal balanitis -     Fluconazole; Take 1 tablet (150 mg total) by mouth once for 1 dose.  Dispense: 1 tablet; Refill: 1  Abscess, dental -     Ambulatory referral to Oral Maxillofacial Surgery -     Amoxicillin-Pot Clavulanate; Take 1 tablet by mouth 2 (two) times daily for 10 days.  Dispense: 20 tablet; Refill: 0 -     oxyCODONE HCl; Take 1 tablet (5 mg total) by mouth every 6 (six) hours as needed  for up to 8 days for severe pain.  Dispense: 30 tablet; Refill: 0  Paroxysmal atrial fibrillation (HCC)- He has good rate/rhythm control. -     Xarelto; Take 1 tablet (20 mg total) by mouth daily with supper.  Dispense: 90 tablet; Refill: 1  History of pulmonary embolus (PE) -     Xarelto; Take 1 tablet (20  mg total) by mouth daily with supper.  Dispense: 90 tablet; Refill: 1     Follow-up: Return in about 3 weeks (around 07/16/2023).  Sanda Linger, MD

## 2023-06-25 NOTE — Patient Instructions (Signed)
Dental Abscess  A dental abscess is an infection around a tooth that may involve pain, swelling, and a collection of pus, as well as other symptoms. Treatment is important to help with symptoms and to prevent the infection from spreading. The general types of dental abscesses are: Pulpal abscess. This abscess may form from the inner part of the tooth (pulp). Periodontal abscess. This abscess may form from the gum. What are the causes? This condition is caused by a bacterial infection in or around the tooth. It may result from: Severe tooth decay (cavities). Trauma to the tooth, such as a broken or chipped tooth. What increases the risk? This condition is more likely to develop in males. It is also more likely to develop in people who: Have cavities. Have severe gum disease. Eat sugary snacks between meals. Use tobacco products. Have diabetes. Have a weakened disease-fighting system (immune system). Do not brush and care for their teeth regularly. What are the signs or symptoms? Mild symptoms of this condition include: Tenderness. Bad breath. Fever. A bitter taste in the mouth. Pain in and around the infected tooth. Moderate symptoms of this condition include: Swollen neck glands. Chills. Pus drainage. Swelling and redness around the infected tooth, in the mouth, or in the face. Severe pain in and around the infected tooth. Severe symptoms of this condition include: Difficulty swallowing. Difficulty opening the mouth. Nausea. Vomiting. How is this diagnosed? This condition is diagnosed based on: Your symptoms and your medical and dental history. An examination of the infected tooth. During the exam, your dental care provider may tap on the infected tooth. You may also need to have X-rays taken of the affected area. How is this treated? This condition is treated by getting rid of the infection. This may be done with: Antibiotic medicines. These may be used in certain  situations. Antibacterial mouth rinse. Incision and drainage. This procedure is done by making an incision in the abscess to drain out the pus. Removing pus is the first priority in treating an abscess. A root canal. This may be performed to save the tooth. Your dental care provider accesses the visible part of your tooth (crown) with a drill and removes any infected pulp. Then the space is filled and sealed off. Tooth extraction. The tooth is pulled out if it cannot be saved by other treatment. You may also receive treatment for pain, such as: Acetaminophen or NSAIDs. Gels that contain a numbing medicine. An injection to block the pain near your nerve. Follow these instructions at home: Medicines Take over-the-counter and prescription medicines only as told by your dental care provider. If you were prescribed an antibiotic, take it as told by your dental care provider. Do not stop taking the antibiotic even if you start to feel better. If you were prescribed a gel that contains a numbing medicine, use it exactly as told in the directions. Do not use these gels for children who are younger than 2 years of age. Use an antibacterial mouth rinse as told by your dental care provider. General instructions  Gargle with a mixture of salt and water 3-4 times a day or as needed. To make salt water, completely dissolve -1 tsp (3-6 g) of salt in 1 cup (237 mL) of warm water. Eat a soft diet while your abscess is healing. Drink enough fluid to keep your urine pale yellow. Do not apply heat to the outside of your mouth. Do not use any products that contain nicotine or tobacco. These   products include cigarettes, chewing tobacco, and vaping devices, such as e-cigarettes. If you need help quitting, ask your dental care provider. Keep all follow-up visits. This is important. How is this prevented?  Excellent dental home care, which includes brushing your teeth every morning and night with fluoride  toothpaste. Floss one time each day. Get regularly scheduled dental cleanings. Consider having a dental sealant applied on teeth that have deep grooves to prevent cavities. Drink fluoridated water regularly. This includes most tap water. Check the label on bottled water to see if it contains fluoride. Reduce or eliminate sugary drinks. Eat healthy meals and snacks. Wear a mouth guard or face shield to protect your teeth while playing sports. Contact a health care provider if: Your pain is worse and is not helped by medicine. You have swelling. You see pus around the tooth. You have a fever or chills. Get help right away if: Your symptoms suddenly get worse. You have a very bad headache. You have problems breathing or swallowing. You have trouble opening your mouth. You have swelling in your neck or around your eye. These symptoms may represent a serious problem that is an emergency. Do not wait to see if the symptoms will go away. Get medical help right away. Call your local emergency services (911 in the [place] Do not drive yourself to the hospital. Summary A dental abscess is a collection of pus in or around a tooth that results from an infection. A dental abscess may result from severe tooth decay, trauma to the tooth, or severe gum disease around a tooth. Symptoms include severe pain, swelling, redness, and drainage of pus in and around the infected tooth. The first priority in treating a dental abscess is to drain out the pus. Treatment may also involve removing damage inside the tooth (root canal) or extracting the tooth. This information is not intended to replace advice given to you by your health care provider. Make sure you discuss any questions you have with your health care provider. Document Revised: 01/24/2021 Document Reviewed: 01/24/2021 Elsevier Patient Education  2024 [employer]  

## 2023-06-25 NOTE — Telephone Encounter (Signed)
CRITICAL VALUE: TROPONIN 25   RECEIVER (on-site recipient of call): Kory Rains K. CMA   DATE & TIME NOTIFIED: 06/25/23 @15 :17   MESSENGER (representative from lab): Hope   MD NOTIFIED: YES

## 2023-06-26 ENCOUNTER — Other Ambulatory Visit: Payer: Medicare HMO

## 2023-06-26 ENCOUNTER — Other Ambulatory Visit (INDEPENDENT_AMBULATORY_CARE_PROVIDER_SITE_OTHER): Payer: Medicare HMO

## 2023-06-26 ENCOUNTER — Other Ambulatory Visit: Payer: Self-pay

## 2023-06-26 DIAGNOSIS — E118 Type 2 diabetes mellitus with unspecified complications: Secondary | ICD-10-CM

## 2023-06-26 LAB — HEMOGLOBIN A1C: Hgb A1c MFr Bld: 5.5 % (ref 4.6–6.5)

## 2023-06-30 ENCOUNTER — Other Ambulatory Visit (HOSPITAL_COMMUNITY): Payer: Medicare HMO

## 2023-06-30 ENCOUNTER — Encounter: Payer: Self-pay | Admitting: Cardiology

## 2023-07-03 ENCOUNTER — Ambulatory Visit (HOSPITAL_COMMUNITY)
Admission: RE | Admit: 2023-07-03 | Discharge: 2023-07-03 | Disposition: A | Discharge status: Home / Self Care | Payer: Medicare HMO | Source: Ambulatory Visit | Attending: Cardiology | Admitting: Cardiology

## 2023-07-03 DIAGNOSIS — D509 Iron deficiency anemia, unspecified: Secondary | ICD-10-CM | POA: Insufficient documentation

## 2023-07-03 MED ORDER — SODIUM CHLORIDE 0.9 % IV SOLN
510.0000 mg | Freq: Once | INTRAVENOUS | Status: AC
  Administered 2023-07-03: 510 mg via INTRAVENOUS
  Filled 2023-07-03: qty 510

## 2023-07-04 ENCOUNTER — Other Ambulatory Visit (HOSPITAL_COMMUNITY): Payer: Self-pay | Admitting: Cardiology

## 2023-07-06 ENCOUNTER — Ambulatory Visit: Payer: Medicare HMO | Attending: Cardiology

## 2023-07-06 DIAGNOSIS — Z95810 Presence of automatic (implantable) cardiac defibrillator: Secondary | ICD-10-CM | POA: Diagnosis not present

## 2023-07-06 DIAGNOSIS — I5022 Chronic systolic (congestive) heart failure: Secondary | ICD-10-CM | POA: Diagnosis not present

## 2023-07-07 NOTE — Progress Notes (Signed)
EPIC Encounter for ICM Monitoring  Patient Name: [name] is a 57 y.o. male Date: 07/07/2023 Primary Care Physican: Etta Grandchild, MD Primary Cardiologist: Shirlee Latch Electrophysiologist: Townsend Roger Pacing: 96% 10/22/2022 Weight: 372 lbs   11/25/2022 Weight: 365 lbs   02/04/2023 Weight:  358 lbs       06/01/2023 Weight: 328 lbs (losing weight taking Mounjaro)   06/09/2023 Weight: 328 lbs             07/07/2023 Weight: 308 lbs              Spoke with patient and heart failure questions reviewed.  Transmission results reviewed.  Pt reports feeling like he had a lot fluid and has not been consistently taking Torsemide as prescribed.  He took extra tablet yesterday with good urine output.       CorVue thoracic impedance suggesting possible fluid accumulation starting 8/4 and also from 7/30-8/2.   Prescribed:  Torsemide 20 mg Take 2 tablet(s) (40 mg total) every other day alternating with 1 tablet (20 mg total) every other day Potassium 20 mEq take 2 tablet(s) (40 mEq total) by mouth daily. Spironolactone 25 mg take 1 tablet daily   Labs: 06/16/2023 Creatinine 1.10, BUN 13, Potassium 3.5, Sodium 136, GFR >60 02/06/2023 Creatinine 1.86, BUN 24, Potassium 4.2, Sodium 138, GFR 40.56 01/09/2023 Creatinine 1.68, BUN 18, Potassium 3.2, Sodium 139, GFR 48  12/23/2022 Creatinine 1.53, BUN 15, Potassium 3.9, Sodium 137, GFR 54  10/10/2022 Creatinine 1.74, BUN 16, Potassium 4.1, Sodium 140, GFR 46 09/25/2022 Creatinine 1.72, BUN 22, Potassium 3.8, Sodium 139, GFR 44.67  A complete set of results can be found in Results Review.   Recommendations:   Discussed taking Torsemide consistently to help manage fluid levels better.  He does not feel like he has any fluid at this time.     Follow-up plan: ICM clinic phone appointment 08/10/2023.   91 day device clinic remote transmission 07/28/2023.     EP/Cardiology Office Visits:   Recall 08/28/2023 with Dr Shirlee Latch.  Recall 08/09/2023 with EP APP.   Copy of ICM  check sent to Dr. Lalla Brothers.     3 month ICM trend: 07/06/2023.    12-14 Month ICM trend:     Karie Soda, RN 07/07/2023 9:10 AM

## 2023-07-13 ENCOUNTER — Telehealth: Payer: Self-pay | Admitting: Pharmacist

## 2023-07-13 MED ORDER — MOUNJARO 12.5 MG/0.5ML ~~LOC~~ SOAJ: 12.5000 mg | SUBCUTANEOUS | 0 refills | Status: DC

## 2023-07-13 NOTE — Telephone Encounter (Signed)
Patient called today stating that he would like to go up on [facility]. Says that his dose was decreased bc he was not eating, but now he feels like it is wearing off and he is eating more and gaining weight. Rx for [facility] 12.5mg  sent into [facility].

## 2023-07-15 ENCOUNTER — Other Ambulatory Visit: Payer: Self-pay | Admitting: Psychiatry

## 2023-07-25 DIAGNOSIS — G4733 Obstructive sleep apnea (adult) (pediatric): Secondary | ICD-10-CM | POA: Diagnosis not present

## 2023-07-28 ENCOUNTER — Other Ambulatory Visit: Payer: Self-pay | Admitting: Internal Medicine

## 2023-07-28 ENCOUNTER — Ambulatory Visit (INDEPENDENT_AMBULATORY_CARE_PROVIDER_SITE_OTHER): Payer: Medicare HMO

## 2023-07-28 DIAGNOSIS — I5022 Chronic systolic (congestive) heart failure: Secondary | ICD-10-CM | POA: Diagnosis not present

## 2023-07-28 DIAGNOSIS — I428 Other cardiomyopathies: Secondary | ICD-10-CM

## 2023-07-28 DIAGNOSIS — K219 Gastro-esophageal reflux disease without esophagitis: Secondary | ICD-10-CM

## 2023-07-28 LAB — CUP PACEART REMOTE DEVICE CHECK
Battery Remaining Longevity: 65 mo
Battery Remaining Percentage: 74 %
Battery Voltage: 2.98 V
Brady Statistic AP VP Percent: 60 %
Brady Statistic AP VS Percent: 2.6 %
Brady Statistic AS VP Percent: 36 %
Brady Statistic AS VS Percent: 1 %
Brady Statistic RA Percent Paced: 61 %
Date Time Interrogation Session: 20240827020210
HighPow Impedance: 71 Ohm
Implantable Lead Connection Status: 753985
Implantable Lead Connection Status: 753985
Implantable Lead Connection Status: 753985
Implantable Lead Implant Date: 20230213
Implantable Lead Implant Date: 20230213
Implantable Lead Implant Date: 20230213
Implantable Lead Location: 753857
Implantable Lead Location: 753859
Implantable Lead Location: 753860
Implantable Pulse Generator Implant Date: 20230213
Lead Channel Impedance Value: 440 Ohm
Lead Channel Impedance Value: 460 Ohm
Lead Channel Impedance Value: 850 Ohm
Lead Channel Pacing Threshold Amplitude: 0.75 V
Lead Channel Pacing Threshold Amplitude: 0.75 V
Lead Channel Pacing Threshold Amplitude: 0.75 V
Lead Channel Pacing Threshold Pulse Width: 0.5 ms
Lead Channel Pacing Threshold Pulse Width: 0.5 ms
Lead Channel Pacing Threshold Pulse Width: 0.5 ms
Lead Channel Sensing Intrinsic Amplitude: 11.8 mV
Lead Channel Sensing Intrinsic Amplitude: 2.9 mV
Lead Channel Setting Pacing Amplitude: 1.5 V
Lead Channel Setting Pacing Amplitude: 2 V
Lead Channel Setting Pacing Amplitude: 2.5 V
Lead Channel Setting Pacing Pulse Width: 0.5 ms
Lead Channel Setting Pacing Pulse Width: 0.5 ms
Lead Channel Setting Sensing Sensitivity: 0.5 mV
Pulse Gen Serial Number: 210000080
Zone Setting Status: 755011

## 2023-07-29 ENCOUNTER — Telehealth (HOSPITAL_COMMUNITY): Payer: Self-pay

## 2023-07-29 NOTE — Progress Notes (Signed)
Advanced [facility] Failure Clinic Note   Primary Care: Dr. Sanda Linger HF Cardiologist: Dr. Shirlee Latch   HPI: [name] is a 57 y.o. male with a past medical history of NICM, EF 25-30% in March 2018, felt to be related to prior ETOH abuse. He also has a history of PE 03/2014 completed a years course of Xarelto, morbid obesity, OSA, and tobacco abuse.    He was admitted 11/12/15-11/14/15 with acute on chronic systolic CHF and palpitations. He wore a 30 day event monitor at discharge as he had frequent PVC's and questionable Afib on telemetry. Also with some NSVT, so he was started on Amiodaone, but at follow up had not started taking it. He had previously refused ICD and was seen inpatient by Dr. Johney Frame who felt that his morbid obesity was a prohibitive factor.    He was seen in the clinic in April 2018. He had started drinking ETOH again. Volume status was stable, he is not an Entresto candidate due to angioedema with lisinopril. Weight was 411 pounds.    Admitted 04/12/17 with SOB, chest pain. D- dimer was 1.16, chest CT without central obstructing PE, however more peripheral and subsegmental pulmonary artery branches were not confidently evaluated due to his body habitus. He was started on a heparin gtt for presumed PE, however VQ scan showed no PE. Troponin was elevated, peaked at 3.37. LHC showed no CAD. Echo showed an EF of 15%, grade 2 DD, no pericarditis. He was diuresed with IV lasix, and started on torsemide 20mg  at discharge. Discharge weight was 405 pounds.   Admitted 5/22 through 04/24/2018 with abdominal pain, nausea, and vomiting. 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 Did not require surgical intervention. Followed by outpatient GI.   Echo in 2/21 with EF 20-25%, severe LV dilation.   Presented 05/09/21 w/ SOB 2/2 acute CHF w ? PNA.  CTA showed multifocal ?PNA, no PE. Also in Afib w/ RVR in 150s on admit. Initially required bipap, developed hemoptysis and intubated. Treated w/ IV Lasix. Echo 05/10/21:  LVEF < 20%. RV moderately reduced. Improved and was extubated 6/13. Went into Afib w/ RVR. Refused to wear BiPAP. Developed severe agitation/hypoxia and re-intubated.  Went into VT>>VF arrest, treated w/ ACLS>>ROSC after . Developed refractory AFib again next morning requiring emergent cardioversion 05/16/21. He was transitioned to PO amiodarone. HR remained in 40-50's, metoprolol and sildenafil were stopped. Discharge weight 367 lbs.  St Jude CRT-D device placed in 2/23.  Echo 8/23 showed EF <20%, mild MR, RV normal.  CPX in 9/23 showed moderate functional limitation due to HF and body habitus.   Follow up 11/23,  NYHA II and volume stable. Remained in NSR and amiodarone decreased to 100 mg daily.   Last seen 06/16/23 and was found to be volume overloaded on exam and by device interrogation. Torsemide was increased to 40 mg daily alternating with 20 mg every other day. He also complained of fatigued. Iron studies were checked and c/w IDA. Referred for Feraheme infusion, 8/2.    He returns back today for f/u. Wt is down 8 lb since last OV. He still feels poorly. Tired and fatigued. C/w w/ dizziness, worse the last 2 days. Also w/ intermittent palpitations over the last several days. BP 106/74 (prior to AM meds). EKG today shows atrial- sensed v paced rhythm, 82 bpm. QTc 490 ms.   Device interrogation shows AT/AF burden < 1%, no SVT episodes. No VT/VF. CorVue impedence ok.    After review of meds, he states that  he self increased torsemide to 40 mg daily, instead of instructed alternation of 20-40 mg every other day. Also only taking Coreg 6.25 mg once a day. Feels worse if he takes it twice daily.    St Jude device interrogation: 97% BiV pacing, CorVue impedence ok, no VT/VF.  ECG (personally reviewed): AV paced 82 bpm   Labs (1/19): K 3.8 Creatinine 1.25  Labs (2/19): K 3.8 Creatinine 1.08 Labs (4/19): K 4.1 creatinine 1/15 BNP 212  Labs (5/19): creatinine 1.1  Labs (10/19): LDL 166, K 3.8,  creatinine 1.01, AST 102 => 25, ALT 125 => 37, alkaline phosphatase 233 Labs (11/20): LDL 106, TGs 232 Labs (2/21): K 3.3, creatinine 1.3 Labs (4/21): LDL 114, TGs 466, K 3.6, creatinine 1.22 Labs (12/21): K 3.5, creatinine 1.08, LDL 164, TGs 133 Labs (722): K 3.8, creatinine 1.4 Labs (8/22): K 3.8, creatinine 1.23 Labs (9/22): K 3.6, creatinine 1.26 Labs (2/23): LFTs normal, TSH normal, K 4, creatinine 1.3 Labs (8/23): K 3.4, creatinine 1.49 Labs (10/23): K 3.8, creatinine 1.72 Labs (11/23): LFTs normal, TSH normal Labs (1/24): K 3.9, creatinine 1.53 Labs (3/24): K 4.2, creatinine 1.86, LDL 65 Labs (7/24): K 3.5, creatinine 1.10, BNP 241, Hgb 14.5, Hgb A1c 5.5   PMH: 1. Nonischemic cardiomyopathy: Prior cath with no significant CAD.  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  Prior echoes with EF as low as 25%.  Echo (9/13) with EF 35-40%, moderate to severe LV dilation, diffuse hypokinesis, mild MR. Echo (5/15) with EF 30-35%, moderate to severe LAE, normal RV size and systolic function.  Angioedema with ACEI, headaches with hydralazine/nitrates. Echo (3/16) with EF 25-30%, severe LV dilation, normal RV size and systolic function.  [facility] 08/13/15 showed no significant coronary disease; RA mean 6, PA 33/11 mean 23, PCWP mean 13, Fick CO/CI 4.75 /1.68 (difficult study, radial artery spasm, if needs future cath would use groin).  Echo (9/16) showed EF 20-25%.   - Echo (3/18): EF 25-30%, moderate LAE - Echo 4/18 EF 15% - Echo 7/19 EF 20-25% - Echo 2/21 with EF 20-25%, severe LV dilation.  - Echo 6/22 with EF < 20%, normal RV. - St Jude CRT-D 2/23.  - Echo (8/23): EF < 20%, mild MR, RV normal.  - CPX (9/23): Peak VO2 13.1, VE/VCO2 slope 37, RER 1.10.  Moderate functional limitation, body habitus and HF.  2. HTN: angioedema with ACEI.  3. OSA: on Bipap 4. Morbid obesity 5. Paroxysmal atrial fibrillation: Urgent DCCV 6/22. 6. Smoker.  7. Anxiety/panic  attacks 8. PE: 5/15, diagnosed by V/Q scan. CTA chest 8/16 negative for PE.  9. NSVT, PVCs: 30 day monitor (12/16) with PVCs, PACs, no atrial fibrillation.  - Zio patch (9/19): few short NSVT runs, no atrial fibrillation, 1.1% PVCs 10. Hematuria: Apparently had negative workup by urology.  11. ABIs (6/16) were normal 12. 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  13. Gout 14. Low back pain.  15. 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 CKD stage 3 17. Type 2 diabetes   SH: Separated, lives in [place], drinks ETOH occasionally, no drugs. Prior smoker.  Has son and daughter.     FH: No premature CAD.    Review of systems complete and found to be negative unless listed in HPI.   Current Outpatient Medications  Medication Sig Dispense Refill   albuterol (VENTOLIN HFA) 108 (90 Base) MCG/ACT inhaler INHALE 1-2 PUFFS INTO THE LUNGS EVERY 4 (FOUR) HOURS AS NEEDED FOR SHORTNESS OF BREATH  OR WHEEZING. 8.5 each 1   amiodarone (PACERONE) 200 MG tablet Take 0.5 tablets (100 mg total) by mouth daily. Patient taking once daily 90 tablet 1   ARIPiprazole (ABILIFY) 5 MG tablet Take 1 tablet (5 mg total) by mouth daily. 30 tablet 1   atorvastatin (LIPITOR) 40 MG tablet Take 1 tablet (40 mg total) by mouth daily. 90 tablet 3   carvedilol (COREG) 6.25 MG tablet Take 1 tablet (6.25 mg total) by mouth 2 (two) times daily. 60 tablet 11   dapagliflozin propanediol (FARXIGA) 10 MG TABS tablet Take 1 tablet (10 mg total) by mouth daily. 90 tablet 1   pantoprazole (PROTONIX) 40 MG tablet TAKE 1 TABLET BY MOUTH EVERY DAY 90 tablet 0   potassium chloride SA (KLOR-CON M) 20 MEQ tablet Take 2 tablets (40 mEq total) by mouth daily. 180 tablet 3   PRESCRIPTION MEDICATION Inhale into the lungs See admin instructions. Bipap, pressure 16/12 with 2L of O2 - use whenever sleeping     sildenafil (REVATIO) 20 MG tablet TAKE 1 TABLET BY MOUTH AS NEEDED, MAY take UP TO FOUR TABLETS AS NEEDED 20 tablet 0   sildenafil (VIAGRA) 50 MG tablet Take 1  tablet by mouth daily as needed for erectile dysfunction (begin with one tablet --may take 2 if necessary). 15 tablet 5   spironolactone (ALDACTONE) 25 MG tablet Take 1 tablet (25 mg total) by mouth at bedtime. 90 tablet 3   tirzepatide (MOUNJARO) 12.5 MG/0.5ML Pen Inject 12.5 mg into the skin once a week. (Patient taking differently: Inject 10 mg into the skin once a week.) 2 mL 0   XARELTO 20 MG TABS tablet Take 1 tablet (20 mg total) by mouth daily with supper. 90 tablet 1   losartan (COZAAR) 25 MG tablet Take 0.5 tablets (12.5 mg total) by mouth at bedtime. 15 tablet 3   torsemide (DEMADEX) 20 MG tablet Take 40 mg by mouth every other day alternating with 20 mg every other day 60 tablet 6   No current facility-administered medications for this encounter.   Allergies  Allergen Reactions   Ace Inhibitors Anaphylaxis and Swelling    Angioedema   Bidil [Isosorb Dinitrate-Hydralazine] Other (See Comments)    headache   Digoxin And Related     Unspecified "side effects"   Buspirone Other (See Comments)    dizziness   Social History   Socioeconomic History   Marital status: Married    Spouse name: Not on file   Number of children: 3   Years of education: Not on file   Highest education level: Not on file  Occupational History   Occupation: DISABLED  Tobacco Use   Smoking status: Some Days    Current packs/day: 0.00    Average packs/day: 0.5 packs/day for 30.0 years (15.0 ttl pk-yrs)    Types: Cigarettes    Start date: 05/15/1990    Last attempt to quit: 05/15/2020    Years since quitting: 3.2   Smokeless tobacco: Never   Tobacco comments:    vaping - nicotine-free products  Vaping Use   Vaping status: Never Used  Substance and Sexual Activity   Alcohol use: Not Currently    Alcohol/week: 0.0 standard drinks of alcohol   Drug use: No   Sexual activity: Not Currently  Other Topics Concern   Not on file  Social History Narrative   He smokes about a pack per day and he has  been smoking since he was 57 years of age.  He drinks alcohol occasionally, but he denies any illicit drug abuse.  He is presently on disability.    Lives with wife in a 2 story home.  Has 2 children.   Previously worked in Office manager, last worked in 1998.   Highest level of education:  11th grade           Social Determinants of Health   Financial Resource Strain: Low Risk  (08/26/2022)   Overall Financial Resource Strain (CARDIA)    Difficulty of Paying Living Expenses: Not very hard  Food Insecurity: No Food Insecurity (08/26/2022)   Hunger Vital Sign    Worried About Running Out of Food in the Last Year: Never true    Ran Out of Food in the Last Year: Never true  Transportation Needs: No Transportation Needs (08/26/2022)   PRAPARE - Administrator, Civil Service (Medical): No    Lack of Transportation (Non-Medical): No  Physical Activity: Sufficiently Active (08/26/2022)   Exercise Vital Sign    Days of Exercise per Week: 5 days    Minutes of Exercise per Session: 30 min  Stress: No Stress Concern Present (08/26/2022)   [employer] of Occupational Health - Occupational Stress Questionnaire    Feeling of Stress : Only a little  Social Connections: Socially Isolated (08/26/2022)   Social Connection and Isolation Panel [NHANES]    Frequency of Communication with Friends and Family: Once a week    Frequency of Social Gatherings with Friends and Family: Never    Attends Religious Services: Never    Database administrator or Organizations: No    Attends Banker Meetings: Never    Marital Status: Separated  Intimate Partner Violence: Not At Risk (08/26/2022)   Humiliation, Afraid, Rape, and Kick questionnaire    Fear of Current or Ex-Partner: No    Emotionally Abused: No    Physically Abused: No    Sexually Abused: No   Family History  Problem Relation Age of Onset   Cancer Mother        brain tumor   Hypertension Mother    Diabetes Father         Deceased, 81   Heart disease Maternal Grandmother    Hypertension Other        Family History   Stroke Other        Family History   Diabetes Other        Family History   Diabetes Daughter    BP 106/74   Pulse 82   Wt (!) 142.4 kg (314 lb)   SpO2 99%   BMI 46.37 kg/m   Wt Readings from Last 3 Encounters:  07/30/23 (!) 142.4 kg (314 lb)  07/03/23 (!) 141.5 kg (312 lb)  06/25/23 (!) 143.8 kg (317 lb)   PHYSICAL EXAM: General:  Well appearing, obese. No respiratory difficulty HEENT: normal Neck: supple. no JVD. Carotids 2+ bilat; no bruits. No lymphadenopathy or thyromegaly appreciated. Cor: PMI nondisplaced. Regular rate & rhythm. No rubs, gallops or murmurs. Lungs: clear Abdomen: obese, soft, nontender, nondistended. No hepatosplenomegaly. No bruits or masses. Good bowel sounds. Extremities: no cyanosis, clubbing, rash, edema Neuro: alert & oriented x 3, cranial nerves grossly intact. moves all 4 extremities w/o difficulty. Affect pleasant.  ASSESSMENT & PLAN: 1. Atrial fibrillation: Paroyxsmal. Emergent DCCV in 6/22. EKG w/ atrial- sensed v paced rhythm. Device interrogation shows < 1% AT/AF burden.  - Continue amiodarone 100 mg daily. Check HFT, and TSH today.  Needs  regular eye exam.  - Continue Xarelto. No bleeding issues. CBC today. - Based on body habitus, not good ablation candidate.  2.  Chronic systolic CHF: Nonischemic cardiomyopathy. ?If ETOH has played a role (now drinks rarely). Echo (3/18) with EF 25-30%. Echo 7/19 and in 2/21 with EF 20-25%. Echo 6/22 with EF < 20%, normal RV.  Has St Jude CRT-D device now.  Echo-post CRT (8/23) showed EF < 20%, mild MR, RV normal.  CPX in 9/23 with moderate functional limitation due to HF and body habitus.  NYHA class IIb-III. W/ recent low BP and positional dizziness. Not grossly volume overloaded on exam, think he may be a little dry. Device interrogation does not suggest volume overload.  - reduce torsemide back down to  alternating doses of 20 and 40 mg every other day.  - reduce losartan to 12.5 mg and switch to bedtime. - he is only taking Coreg 6.25 mg once daily. Intolerant to bid dosing, reports increased fatigue and dizziness. Explained that he needs to take twice daily for full benefit. Will reduce to 3.125 mg bid  - Continue spironolactone 25 mg daily. - Continue Farxiga 10 mg daily. - No Entresto, ACEI with h/o angioedema.  - Headaches with Bidil, cannot take.  - He did not tolerate digoxin.  - check BMP and BNP today  - Insurance denied baroreceptor activation therapy and he has CRT now.  - I am concerned about him long-term, he is too heavy for heart transplant and needs weight loss even to get him to LVAD.  Weight is slowly coming down, we discussed repeating CPX +/- RHC soon to risk stratify.   - Repeat echo next visit, 9/27.  3. HTN: soft. Reports recent dizziness but no syncope/ near syncope.  - diuretic/GDMT dose reduction per above - check BMP today  4. 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  Has St Jude CRT-D device now. On amiodarone. No VT on device interrogation  - continue amiodarone 100 mg daily. QTc ok on EKG. Check TSH and HFTs today  5. OSA: reports full compliance w/ BiPAP.  - Continue nightly. 6. PE: 04/14/2018, diagnosed by V/Q scan. Has been on Xarelto. No bleeding issues. 7. Anxiety/Panic attacks/depression: Followed by psychiatry. 8. Hyperlipidemia:  LDL 65 (3/24) - Continue atorvastatin. 9. Obesity: Body mass index is 46.37 kg/m.  - He is now on Mounjaro. Has lost ~100 lb so far   10. Tobacco use: Back smoking. Discussed cessation. 11. Iron Deficiency Anemia: received dose of Feraheme 8/2 - repeat CBC, Ferritin, Iron and TIBC  12. T2DM  - on Mounjaro and Farxiga - on statin + ARB  - pt requesting Hgb A1c be checked today. Will order   Keep f/u w/  Dr. Shirlee Latch + echo next month   Kaislee Chao Sharol Harness PA-C 07/30/2023

## 2023-07-29 NOTE — Telephone Encounter (Signed)
Called to confirm/remind patient of their appointment at the [facility] on 07/30/23.   Patient reminded to bring all medications and/or complete list.  Confirmed patient has transportation. Gave directions, instructed to utilize valet parking.  Confirmed appointment prior to ending call.

## 2023-07-30 ENCOUNTER — Encounter (HOSPITAL_COMMUNITY): Payer: Self-pay

## 2023-07-30 ENCOUNTER — Ambulatory Visit (HOSPITAL_COMMUNITY)
Admission: RE | Admit: 2023-07-30 | Discharge: 2023-07-30 | Disposition: A | Discharge status: Home / Self Care | Payer: Medicare HMO | Source: Ambulatory Visit | Attending: Cardiology | Admitting: Cardiology

## 2023-07-30 VITALS — BP 106/74 | HR 82 | Wt 314.0 lb

## 2023-07-30 DIAGNOSIS — Z86711 Personal history of pulmonary embolism: Secondary | ICD-10-CM | POA: Diagnosis not present

## 2023-07-30 DIAGNOSIS — Z7984 Long term (current) use of oral hypoglycemic drugs: Secondary | ICD-10-CM | POA: Diagnosis not present

## 2023-07-30 DIAGNOSIS — F410 Panic disorder [episodic paroxysmal anxiety] without agoraphobia: Secondary | ICD-10-CM | POA: Insufficient documentation

## 2023-07-30 DIAGNOSIS — R112 Nausea with vomiting, unspecified: Secondary | ICD-10-CM | POA: Diagnosis not present

## 2023-07-30 DIAGNOSIS — I480 Paroxysmal atrial fibrillation: Secondary | ICD-10-CM | POA: Insufficient documentation

## 2023-07-30 DIAGNOSIS — D509 Iron deficiency anemia, unspecified: Secondary | ICD-10-CM | POA: Insufficient documentation

## 2023-07-30 DIAGNOSIS — Z8674 Personal history of sudden cardiac arrest: Secondary | ICD-10-CM | POA: Insufficient documentation

## 2023-07-30 DIAGNOSIS — I5022 Chronic systolic (congestive) heart failure: Secondary | ICD-10-CM

## 2023-07-30 DIAGNOSIS — F418 Other specified anxiety disorders: Secondary | ICD-10-CM | POA: Insufficient documentation

## 2023-07-30 DIAGNOSIS — Z7985 Long-term (current) use of injectable non-insulin antidiabetic drugs: Secondary | ICD-10-CM | POA: Diagnosis not present

## 2023-07-30 DIAGNOSIS — F17210 Nicotine dependence, cigarettes, uncomplicated: Secondary | ICD-10-CM | POA: Insufficient documentation

## 2023-07-30 DIAGNOSIS — I428 Other cardiomyopathies: Secondary | ICD-10-CM | POA: Insufficient documentation

## 2023-07-30 DIAGNOSIS — E785 Hyperlipidemia, unspecified: Secondary | ICD-10-CM | POA: Diagnosis not present

## 2023-07-30 DIAGNOSIS — G4733 Obstructive sleep apnea (adult) (pediatric): Secondary | ICD-10-CM | POA: Diagnosis not present

## 2023-07-30 DIAGNOSIS — Z6841 Body Mass Index (BMI) 40.0 and over, adult: Secondary | ICD-10-CM | POA: Insufficient documentation

## 2023-07-30 DIAGNOSIS — Z7901 Long term (current) use of anticoagulants: Secondary | ICD-10-CM | POA: Diagnosis not present

## 2023-07-30 DIAGNOSIS — I130 Hypertensive heart and chronic kidney disease with heart failure and stage 1 through stage 4 chronic kidney disease, or unspecified chronic kidney disease: Secondary | ICD-10-CM | POA: Insufficient documentation

## 2023-07-30 LAB — COMPREHENSIVE METABOLIC PANEL
ALT: 27 U/L (ref 0–44)
AST: 19 U/L (ref 15–41)
Albumin: 3 g/dL — ABNORMAL LOW (ref 3.5–5.0)
Alkaline Phosphatase: 109 U/L (ref 38–126)
Anion gap: 13 (ref 5–15)
BUN: 24 mg/dL — ABNORMAL HIGH (ref 6–20)
CO2: 22 mmol/L (ref 22–32)
Calcium: 9 mg/dL (ref 8.9–10.3)
Chloride: 101 mmol/L (ref 98–111)
Creatinine, Ser: 1.62 mg/dL — ABNORMAL HIGH (ref 0.61–1.24)
GFR, Estimated: 50 mL/min — ABNORMAL LOW (ref >60)
Glucose, Bld: 126 mg/dL — ABNORMAL HIGH (ref 70–99)
Potassium: 3.2 mmol/L — ABNORMAL LOW (ref 3.5–5.1)
Sodium: 136 mmol/L (ref 135–145)
Total Bilirubin: 0.8 mg/dL (ref 0.3–1.2)
Total Protein: 7.1 g/dL (ref 6.5–8.1)

## 2023-07-30 LAB — CBC
HCT: 43.1 % (ref 39.0–52.0)
Hemoglobin: 15.4 g/dL (ref 13.0–17.0)
MCH: 28.3 pg (ref 26.0–34.0)
MCHC: 35.7 g/dL (ref 30.0–36.0)
MCV: 79.2 fL — ABNORMAL LOW (ref 80.0–100.0)
Platelets: 270 10*3/uL (ref 150–400)
RBC: 5.44 MIL/uL (ref 4.22–5.81)
RDW: 14.6 % (ref 11.5–15.5)
WBC: 8.4 10*3/uL (ref 4.0–10.5)
nRBC: 0 % (ref 0.0–0.2)

## 2023-07-30 LAB — HEMOGLOBIN A1C
Hgb A1c MFr Bld: 5.4 % (ref 4.8–5.6)
Mean Plasma Glucose: 108.28 mg/dL

## 2023-07-30 LAB — IRON AND TIBC
Iron: 71 ug/dL (ref 45–182)
Saturation Ratios: 22 % (ref 17.9–39.5)
TIBC: 318 ug/dL (ref 250–450)
UIBC: 247 ug/dL

## 2023-07-30 LAB — TSH: TSH: <0.01 u[IU]/mL — ABNORMAL LOW (ref 0.350–4.500)

## 2023-07-30 LAB — BRAIN NATRIURETIC PEPTIDE: B Natriuretic Peptide: 167.9 pg/mL — ABNORMAL HIGH (ref 0.0–100.0)

## 2023-07-30 LAB — FERRITIN: Ferritin: 305 ng/mL (ref 24–336)

## 2023-07-30 MED ORDER — LOSARTAN POTASSIUM 25 MG PO TABS: 12.5000 mg | ORAL_TABLET | Freq: Every day | ORAL | 3 refills | Status: DC

## 2023-07-30 MED ORDER — TORSEMIDE 20 MG PO TABS: ORAL_TABLET | ORAL | 6 refills | Status: DC

## 2023-07-30 NOTE — Patient Instructions (Addendum)
EKG done today.  Labs done today. We will contact you only if your labs are abnormal.  DECREASE Losartan to 12.5mg  (1/2 tablet) by mouth daily at bedtime  DECREASE Torsemide to 40mg  (2 tablets) by mouth daily alternating with 20mg  (1 tablet) every other day.   No other medication changes were made. Please continue all current medications as prescribed.  Your physician recommends that you keep your scheduled a follow-up appointment.  If you have any questions or concerns before your next appointment please send [place] a message through [place] or call our office at [phone].    TO LEAVE A MESSAGE FOR THE NURSE SELECT OPTION 2, PLEASE LEAVE A MESSAGE INCLUDING: YOUR NAME DATE OF BIRTH CALL BACK NUMBER REASON FOR CALL**this is important as we prioritize the call backs  YOU WILL RECEIVE A CALL BACK THE SAME DAY AS LONG AS YOU CALL BEFORE 4:00 PM   Do the following things EVERYDAY: Weigh yourself in the morning before breakfast. Write it down and keep it in a log. Take your medicines as prescribed Eat low salt foods--Limit salt (sodium) to 2000 mg per day.  Stay as active as you can everyday Limit all fluids for the day to less than 2 liters   At the [facility], you and your health needs are our priority. As part of our continuing mission to provide you with exceptional heart care, we have created designated Provider Care Teams. These Care Teams include your primary Cardiologist (physician) and Advanced Practice Providers (APPs- Physician Assistants and Nurse Practitioners) who all work together to provide you with the care you need, when you need it.   You may see any of the following providers on your designated Care Team at your next follow up: Dr Arvilla Meres Dr Marca Ancona Dr. Marcos Eke, NP Robbie Lis, [place] [facility] [place], [place] Brynda Peon, NP Karle Plumber, PharmD   Please be sure to bring in all your medications  bottles to every appointment.    Thank you for choosing [facility]

## 2023-08-02 NOTE — Progress Notes (Unsigned)
NS

## 2023-08-10 ENCOUNTER — Ambulatory Visit: Payer: Medicare HMO | Attending: Cardiology

## 2023-08-10 DIAGNOSIS — I428 Other cardiomyopathies: Secondary | ICD-10-CM

## 2023-08-10 DIAGNOSIS — I5022 Chronic systolic (congestive) heart failure: Secondary | ICD-10-CM | POA: Diagnosis not present

## 2023-08-10 NOTE — Progress Notes (Signed)
Remote ICD transmission.   

## 2023-08-11 ENCOUNTER — Telehealth: Payer: Self-pay | Admitting: Pharmacist

## 2023-08-11 ENCOUNTER — Ambulatory Visit (INDEPENDENT_AMBULATORY_CARE_PROVIDER_SITE_OTHER): Payer: Self-pay | Admitting: Psychiatry

## 2023-08-11 DIAGNOSIS — Z91199 Patient's noncompliance with other medical treatment and regimen due to unspecified reason: Secondary | ICD-10-CM

## 2023-08-11 NOTE — Telephone Encounter (Signed)
Called pt to follow up on Mounjaro tolerability. The pharmacy refilled a 10mg  dose instead of the 12.5mg  dose, he currently has 1 10mg  dose left and 3 12.5mg  doses left. He will use them all and I'll touch base in another month to see if he wishes to increase up to max 15mg  weekly dose.

## 2023-08-11 NOTE — Progress Notes (Signed)
EPIC Encounter for ICM Monitoring  Patient Name: [name] is a 57 y.o. male Date: 08/11/2023 Primary Care Physican: Etta Grandchild, MD Primary Cardiologist: Shirlee Latch Electrophysiologist: Townsend Roger Pacing: 96% 02/04/2023 Weight:  358 lbs       06/01/2023 Weight: 328 lbs (losing weight taking Mounjaro)   06/09/2023 Weight: 328 lbs             07/07/2023 Weight: 308 lbs   9/10/202 Weight: 308 lbs           Spoke with patient and heart failure questions reviewed.  Transmission results reviewed.  Pt asymptomatic for fluid accumulation.  He was out of town this past weekend and forgot to take his fluid pills.    CorVue thoracic impedance suggesting intermittent days with possible fluid accumulation within the last month.   Prescribed:  Torsemide 20 mg Take 2 tablet(s) (40 mg total) every other day alternating with 1 tablet (20 mg total) every other day Potassium 20 mEq take 2 tablet(s) (40 mEq total) by mouth daily. Spironolactone 25 mg take 1 tablet daily   Labs: 07/30/2023 Creatinine 1.62, BUN 24, Potassium 3.2, Sodium 136, GFR 50 06/16/2023 Creatinine 1.10, BUN 13, Potassium 3.5, Sodium 136, GFR >60 02/06/2023 Creatinine 1.86, BUN 24, Potassium 4.2, Sodium 138, GFR 40.56 01/09/2023 Creatinine 1.68, BUN 18, Potassium 3.2, Sodium 139, GFR 48  12/23/2022 Creatinine 1.53, BUN 15, Potassium 3.9, Sodium 137, GFR 54  A complete set of results can be found in Results Review.   Recommendations:  Recommendation to limit fluid intake to 64 oz daily.  Encouraged to call if experiencing any fluid symptoms.    Follow-up plan: ICM clinic phone appointment 09/14/2023.   91 day device clinic remote transmission 10/27/2023.     EP/Cardiology Office Visits:   08/28/2023 with Dr Shirlee Latch.     Copy of ICM check sent to Dr. Lalla Brothers.     3 month ICM trend: 08/10/2023.    12-14 Month ICM trend:     Karie Soda, RN 08/11/2023 11:48 AM

## 2023-08-13 ENCOUNTER — Encounter: Payer: Self-pay | Admitting: Cardiology

## 2023-08-18 ENCOUNTER — Other Ambulatory Visit (HOSPITAL_COMMUNITY): Payer: Self-pay | Admitting: Cardiology

## 2023-08-18 NOTE — Telephone Encounter (Signed)
Patient called to request refills on sildenafil to local pharmacy as he has relocated    Will confirm with provider ok to fill -last written 05/2022

## 2023-08-19 MED ORDER — SILDENAFIL CITRATE 50 MG PO TABS: 50.0000 mg | ORAL_TABLET | ORAL | 5 refills | Status: DC | PRN

## 2023-08-19 NOTE — Telephone Encounter (Signed)
Refills returned to local pharmacy as requested

## 2023-08-24 ENCOUNTER — Other Ambulatory Visit: Payer: Self-pay | Admitting: Psychiatry

## 2023-08-25 DIAGNOSIS — G4733 Obstructive sleep apnea (adult) (pediatric): Secondary | ICD-10-CM | POA: Diagnosis not present

## 2023-08-28 ENCOUNTER — Other Ambulatory Visit (HOSPITAL_COMMUNITY): Payer: Self-pay

## 2023-08-28 ENCOUNTER — Ambulatory Visit (HOSPITAL_BASED_OUTPATIENT_CLINIC_OR_DEPARTMENT_OTHER)
Admission: RE | Admit: 2023-08-28 | Discharge: 2023-08-28 | Disposition: A | Discharge status: Home / Self Care | Payer: Medicare HMO | Source: Ambulatory Visit | Attending: Cardiology | Admitting: Cardiology

## 2023-08-28 ENCOUNTER — Ambulatory Visit (HOSPITAL_COMMUNITY)
Admission: RE | Admit: 2023-08-28 | Discharge: 2023-08-28 | Disposition: A | Discharge status: Home / Self Care | Payer: Medicare HMO | Source: Ambulatory Visit | Attending: Cardiology | Admitting: Cardiology

## 2023-08-28 ENCOUNTER — Encounter (HOSPITAL_COMMUNITY): Payer: Self-pay | Admitting: Cardiology

## 2023-08-28 VITALS — BP 102/68 | HR 71 | Wt 317.0 lb

## 2023-08-28 DIAGNOSIS — R079 Chest pain, unspecified: Secondary | ICD-10-CM | POA: Diagnosis not present

## 2023-08-28 DIAGNOSIS — F32A Depression, unspecified: Secondary | ICD-10-CM | POA: Diagnosis not present

## 2023-08-28 DIAGNOSIS — D631 Anemia in chronic kidney disease: Secondary | ICD-10-CM | POA: Diagnosis not present

## 2023-08-28 DIAGNOSIS — G4733 Obstructive sleep apnea (adult) (pediatric): Secondary | ICD-10-CM | POA: Insufficient documentation

## 2023-08-28 DIAGNOSIS — F419 Anxiety disorder, unspecified: Secondary | ICD-10-CM | POA: Diagnosis not present

## 2023-08-28 DIAGNOSIS — R002 Palpitations: Secondary | ICD-10-CM | POA: Insufficient documentation

## 2023-08-28 DIAGNOSIS — Z716 Tobacco abuse counseling: Secondary | ICD-10-CM | POA: Insufficient documentation

## 2023-08-28 DIAGNOSIS — I472 Ventricular tachycardia, unspecified: Secondary | ICD-10-CM | POA: Diagnosis not present

## 2023-08-28 DIAGNOSIS — F410 Panic disorder [episodic paroxysmal anxiety] without agoraphobia: Secondary | ICD-10-CM | POA: Diagnosis not present

## 2023-08-28 DIAGNOSIS — I428 Other cardiomyopathies: Secondary | ICD-10-CM | POA: Diagnosis not present

## 2023-08-28 DIAGNOSIS — N183 Chronic kidney disease, stage 3 unspecified: Secondary | ICD-10-CM | POA: Diagnosis not present

## 2023-08-28 DIAGNOSIS — E785 Hyperlipidemia, unspecified: Secondary | ICD-10-CM | POA: Diagnosis not present

## 2023-08-28 DIAGNOSIS — K219 Gastro-esophageal reflux disease without esophagitis: Secondary | ICD-10-CM | POA: Insufficient documentation

## 2023-08-28 DIAGNOSIS — I5042 Chronic combined systolic (congestive) and diastolic (congestive) heart failure: Secondary | ICD-10-CM | POA: Diagnosis not present

## 2023-08-28 DIAGNOSIS — I110 Hypertensive heart disease with heart failure: Secondary | ICD-10-CM | POA: Insufficient documentation

## 2023-08-28 DIAGNOSIS — E1122 Type 2 diabetes mellitus with diabetic chronic kidney disease: Secondary | ICD-10-CM | POA: Insufficient documentation

## 2023-08-28 DIAGNOSIS — Z7901 Long term (current) use of anticoagulants: Secondary | ICD-10-CM | POA: Insufficient documentation

## 2023-08-28 DIAGNOSIS — Z7984 Long term (current) use of oral hypoglycemic drugs: Secondary | ICD-10-CM | POA: Diagnosis not present

## 2023-08-28 DIAGNOSIS — Z79899 Other long term (current) drug therapy: Secondary | ICD-10-CM | POA: Insufficient documentation

## 2023-08-28 DIAGNOSIS — I5022 Chronic systolic (congestive) heart failure: Secondary | ICD-10-CM

## 2023-08-28 DIAGNOSIS — Z6841 Body Mass Index (BMI) 40.0 and over, adult: Secondary | ICD-10-CM | POA: Diagnosis not present

## 2023-08-28 DIAGNOSIS — F17210 Nicotine dependence, cigarettes, uncomplicated: Secondary | ICD-10-CM | POA: Insufficient documentation

## 2023-08-28 DIAGNOSIS — I480 Paroxysmal atrial fibrillation: Secondary | ICD-10-CM | POA: Insufficient documentation

## 2023-08-28 DIAGNOSIS — I4901 Ventricular fibrillation: Secondary | ICD-10-CM | POA: Insufficient documentation

## 2023-08-28 LAB — CBC
HCT: 47.5 % (ref 39.0–52.0)
Hemoglobin: 17.1 g/dL — ABNORMAL HIGH (ref 13.0–17.0)
MCH: 28.9 pg (ref 26.0–34.0)
MCHC: 36 g/dL (ref 30.0–36.0)
MCV: 80.2 fL (ref 80.0–100.0)
Platelets: 252 10*3/uL (ref 150–400)
RBC: 5.92 MIL/uL — ABNORMAL HIGH (ref 4.22–5.81)
RDW: 15.4 % (ref 11.5–15.5)
WBC: 9.2 10*3/uL (ref 4.0–10.5)
nRBC: 0 % (ref 0.0–0.2)

## 2023-08-28 LAB — ECHOCARDIOGRAM COMPLETE
AR max vel: 2.62 cm2
AV Area VTI: 2.68 cm2
AV Area mean vel: 2.64 cm2
AV Mean grad: 3 mm[Hg]
AV Peak grad: 5.4 mm[Hg]
Ao pk vel: 1.16 m/s
Area-P 1/2: 2.87 cm2
Calc EF: 19.6 %
Est EF: <20
P 1/2 time: 297 ms
S' Lateral: 7.2 cm
Single Plane A2C EF: 23.5 %
Single Plane A4C EF: 19 %

## 2023-08-28 LAB — COMPREHENSIVE METABOLIC PANEL
ALT: 20 U/L (ref 0–44)
AST: 18 U/L (ref 15–41)
Albumin: 3.3 g/dL — ABNORMAL LOW (ref 3.5–5.0)
Alkaline Phosphatase: 107 U/L (ref 38–126)
Anion gap: 10 (ref 5–15)
BUN: 15 mg/dL (ref 6–20)
CO2: 23 mmol/L (ref 22–32)
Calcium: 9 mg/dL (ref 8.9–10.3)
Chloride: 103 mmol/L (ref 98–111)
Creatinine, Ser: 1.39 mg/dL — ABNORMAL HIGH (ref 0.61–1.24)
GFR, Estimated: >60 mL/min (ref >60)
Glucose, Bld: 77 mg/dL (ref 70–99)
Potassium: 3.6 mmol/L (ref 3.5–5.1)
Sodium: 136 mmol/L (ref 135–145)
Total Bilirubin: 0.8 mg/dL (ref 0.3–1.2)
Total Protein: 7.5 g/dL (ref 6.5–8.1)

## 2023-08-28 LAB — T4, FREE: Free T4: 1.54 ng/dL — ABNORMAL HIGH (ref 0.61–1.12)

## 2023-08-28 MED ORDER — PERFLUTREN LIPID MICROSPHERE
1.0000 mL | INTRAVENOUS | Status: DC | PRN
  Administered 2023-08-28: 5 mL via INTRAVENOUS

## 2023-08-28 NOTE — Patient Instructions (Signed)
There has been no changes to your medications.  Labs done today, your results will be available in MyChart, we will contact you for abnormal readings.  You have been referred to the Endocrinology. They will call you to arrange your appointment.  Expect to be in the lab for 2 hours. Please plan to arrive 30 minutes prior to your appointment. You may be asked to reschedule your test if you arrive 20 minutes or more after your scheduled appointment time.  Main Campus address: [address] [place], [place] [address] You may arrive to the Main Entrance A or Entrance C (free valet parking is available at both). -Main Entrance A (on [address]) :proceed to admitting for check in -Entrance C (on [facility]): proceed to [employer] parking or under hospital deck parking using this code _________  Check In: [facility] waiting room (1st floor)   General Instructions for the day of the test (Please follow all instructions from your physician): Refrain from ingesting a heavy meal, alcohol, or caffeine or using tobacco products within 2 hours of the test (DO NOT FAST for mare than 8 hours). You may have all other non-alcoholic, non -caffeinated beverage,a light snack (crackers,a piece of fruit, carrot sticks, toast bagel,etc) up to your appointment. Avoid significant exertion or exercise within 24 hours of your test. Be prepared to exercise and sweat. Your clothing should permit freedom of movement and include walking or running shoes. Women bring loose fitting short sleeved blouse.  This evaluation may be fatiguing and you may wish ti have someone accompany you to the assessment to drive you home afterward. Bring a list of your medications with you, including dosage and frequency you take the medications (  I.e.,once per day, twice per day, etc). Take all medications as prescribed, unless noted below or instructed to do so by your physician.  Please do not take the following medications prior  to your CPX:  _________________________________________________  _________________________________________________  Brief description of the test: A brief lung test will be performed. This will involve you taking deep breaths and blowing hard and fast through your mouth. During these , a clip will be on your nose and you will be breathing through a breathing device.   For the exercise portion of the test you will be walking on a treadmill, or riding a stationary bike, to your maximal effor or until symptoms such as chest pain, shortness of breath, leg pain or dizziness limit your exercise. You will be breathing in and out of a breathing device through your mouth (a clip will be on your nose again). Your heart rate, ECG, blood pressure, oxygen saturations, breathing rate and depth, amount of oxygen you consume and amount of carbon dioxide you produce will be measured and monitored throughout the exercise test.  If you need to cancel or reschedule your appointment please call [phone] If you have further questions please call your physician or Philip Aspen, MS, ACSM-RCEP at [phone]   You are scheduled for a Cardiac Catheterization on Tuesday, October 15 with Dr. Marca Ancona.  1. Please arrive at the [facility] (Main Entrance A) at [facility]: [address] [place], [place] [address] at 11:30 AM (This time is 2 hour(s) before your procedure to ensure your preparation). Free valet parking service is available. You will check in at ADMITTING. The support person will be asked to wait in the waiting room.  It is OK to have someone drop you off and come  back when you are ready to be discharged.    Special note: Every effort is made to have your procedure done on time. Please understand that emergencies sometimes delay scheduled procedures.  2. Diet: Do not eat solid foods after midnight.  The patient may have clear liquids until 5am upon the day of the procedure.  3.  Medication  instructions in preparation for your procedure:   Contrast Allergy: No  HOLD YOUR Xarelto DAY BEFORE THE PROCEDURE AND DAY OF PROCEDURE.  HOLD Spironolactone, Torsemide and Farxiga the day of your procedure.  HOLD YOUR MONJURO THE MORNING OF PROCEDURE   On the morning of your procedure, take any morning medicines NOT listed above.  You may use sips of water.  5. Plan to go home the same day, you will only stay overnight if medically necessary. 6. Bring a current list of your medications and current insurance cards. 7. You MUST have a responsible person to drive you home. 8. Someone MUST be with you the first 24 hours after you arrive home or your discharge will be delayed. 9. Please wear clothes that are easy to get on and off and wear slip-on shoes.  Your physician recommends that you schedule a follow-up appointment in: 6 weeks  If you have any questions or concerns before your next appointment please send [place] a message through [place] or call our office at [phone].    TO LEAVE A MESSAGE FOR THE NURSE SELECT OPTION 2, PLEASE LEAVE A MESSAGE INCLUDING: YOUR NAME DATE OF BIRTH CALL BACK NUMBER REASON FOR CALL**this is important as we prioritize the call backs  YOU WILL RECEIVE A CALL BACK THE SAME DAY AS LONG AS YOU CALL BEFORE 4:00 PM  At the [facility], you and your health needs are our priority. As part of our continuing mission to provide you with exceptional heart care, we have created designated Provider Care Teams. These Care Teams include your primary Cardiologist (physician) and Advanced Practice Providers (APPs- Physician Assistants and Nurse Practitioners) who all work together to provide you with the care you need, when you need it.   You may see any of the following providers on your designated Care Team at your next follow up: Dr Arvilla Meres Dr Marca Ancona Dr. Marcos Eke, NP Robbie Lis, [place] [facility] [place], [place] Brynda Peon, NP Karle Plumber, PharmD   Please be sure to bring in all your medications bottles to every appointment.    Thank you for choosing [facility]

## 2023-08-29 LAB — T3, FREE: T3, Free: 3 pg/mL (ref 2.0–4.4)

## 2023-08-30 NOTE — H&P (View-Only) (Signed)
[facility] Note   Primary Care: Dr. Sanda Linger HF Cardiologist: Dr. Shirlee Latch   HPI: [name] is a 57 y.o. male with a past medical history of NICM, EF 25-30% in March 2018, felt to be related to prior ETOH abuse. He also has a history of PE 03/2014 completed a years course of Xarelto, morbid obesity, OSA, and tobacco abuse.    He was admitted 11/12/15-11/14/15 with acute on chronic systolic CHF and palpitations. He wore a 30 day event monitor at discharge as he had frequent PVC's and questionable Afib on telemetry. Also with some NSVT, so he was started on Amiodarone, but at follow up had not started taking it. He had previously refused ICD and was seen inpatient by Dr. Johney Frame who felt that his morbid obesity was a prohibitive factor.    He was seen in the clinic in April 2018. He had started drinking ETOH again. Volume status was stable, he is not an Entresto candidate due to angioedema with lisinopril. Weight was 411 pounds.    Admitted 04/12/17 with SOB, chest pain. D- dimer was 1.16, chest CT without central obstructing PE, however more peripheral and subsegmental pulmonary artery branches were not confidently evaluated due to his body habitus. He was started on a heparin gtt for presumed PE, however VQ scan showed no PE. Troponin was elevated, peaked at 3.37. LHC showed no CAD. Echo showed an EF of 15%, grade 2 DD, no pericarditis. He was diuresed with IV lasix, and started on torsemide 20mg  at discharge. Discharge weight was 405 pounds.   Admitted 5/22 through 04/24/2018 with abdominal pain, nausea, and vomiting. 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 Did not require surgical intervention. Followed by outpatient GI.   Echo in 2/21 with EF 20-25%, severe LV dilation.   Presented 05/09/21 w/ SOB 2/2 acute CHF w ? PNA.  CTA showed multifocal ?PNA, no PE. Also in Afib w/ RVR in 150s on admit. Initially required bipap, developed hemoptysis and intubated. Treated w/ IV Lasix. Echo  05/10/21: LVEF < 20%. RV moderately reduced. Improved and was extubated 6/13. Went into Afib w/ RVR. Refused to wear BiPAP. Developed severe agitation/hypoxia and re-intubated.  Went into VT>>VF arrest, treated w/ ACLS>>ROSC after . Developed refractory AFib again next morning requiring emergent cardioversion 05/16/21. He was transitioned to PO amiodarone. HR remained in 40-50's, metoprolol and sildenafil were stopped. Discharge weight 367 lbs.  St Jude CRT-D device placed in 2/23.  Echo 8/23 showed EF <20%, mild MR, RV normal.  CPX in 9/23 showed moderate functional limitation due to HF and body habitus.   Echo was done today and reviewed, EF <20% with severe LV dilation, normal RV size/function, no significant MR.   Patient returns today for followup of CHF.  He has had a significant weight loss over the last year with Mounjaro, weight used to be > 400 lbs.  He reports significant fatigue with exertion.  Occasional palpitations.  Does not get particularly short of breath walking but tires quickly.  Has occasional GERD-type chest pain after meals.  Using Bipap regularly.  Cannot take even low dose losartan due to orthostatic symptoms. Only can remember to take Coreg once a day.    St Jude device interrogation: 95% BiV pacing, 59% a-pacing, stable thoracic impedance, no VT/AF.    ECG (personally reviewed): NSR with BiV pacing  Labs (1/19): K 3.8 Creatinine 1.25  Labs (2/19): K 3.8 Creatinine 1.08 Labs (4/19): K 4.1 creatinine 1/15 BNP 212  Labs (5/19): creatinine  1.1  Labs (10/19): LDL 166, K 3.8, creatinine 1.01, AST 102 => 25, ALT 125 => 37, alkaline phosphatase 233 Labs (11/20): LDL 106, TGs 232 Labs (2/21): K 3.3, creatinine 1.3 Labs (4/21): LDL 114, TGs 466, K 3.6, creatinine 1.22 Labs (12/21): K 3.5, creatinine 1.08, LDL 164, TGs 133 Labs (722): K 3.8, creatinine 1.4 Labs (8/22): K 3.8, creatinine 1.23 Labs (9/22): K 3.6, creatinine 1.26 Labs (2/23): LFTs normal, TSH normal, K 4,  creatinine 1.3 Labs (8/23): K 3.4, creatinine 1.49 Labs (10/23): K 3.8, creatinine 1.72 Labs (11/23): LFTs normal, TSH normal Labs (1/24): K 3.9, creatinine 1.53 Labs (3/24): K 4.2, creatinine 1.86, LDL 65 Labs (7/24): K 3.5, creatinine 1.10, BNP 241, Hgb 14.5, Hgb A1c 5.5  Labs (8/24): transferrin saturation 22%, BNP 168, TSH < 0.01, K 3.2, creatinine 1.62, LFTs normal  PMH: 1. Nonischemic cardiomyopathy: Prior cath with no significant CAD.  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  Prior echoes with EF as low as 25%.  Echo (9/13) with EF 35-40%, moderate to severe LV dilation, diffuse hypokinesis, mild MR. Echo (5/15) with EF 30-35%, moderate to severe LAE, normal RV size and systolic function.  Angioedema with ACEI, headaches with hydralazine/nitrates. Echo (3/16) with EF 25-30%, severe LV dilation, normal RV size and systolic function.  [facility] 08/13/15 showed no significant coronary disease; RA mean 6, PA 33/11 mean 23, PCWP mean 13, Fick CO/CI 4.75 /1.68 (difficult study, radial artery spasm, if needs future cath would use groin).  Echo (9/16) showed EF 20-25%.   - Echo (3/18): EF 25-30%, moderate LAE - Echo 4/18 EF 15% - Echo 7/19 EF 20-25% - Echo 2/21 with EF 20-25%, severe LV dilation.  - Echo 6/22 with EF < 20%, normal RV. - St Jude CRT-D 2/23.  - Echo (8/23): EF < 20%, mild MR, RV normal.  - CPX (9/23): Peak VO2 13.1, VE/VCO2 slope 37, RER 1.10.  Moderate functional limitation, body habitus and HF.  - Echo (9/24): EF <20% with severe LV dilation, normal RV size/function, no significant MR.  2. HTN: angioedema with ACEI.  3. OSA: on Bipap 4. Morbid obesity 5. Paroxysmal atrial fibrillation: Urgent DCCV 6/22. 6. Smoker.  7. Anxiety/panic attacks 8. PE: 5/15, diagnosed by V/Q scan. CTA chest 8/16 negative for PE.  9. NSVT, PVCs: 30 day monitor (12/16) with PVCs, PACs, no atrial fibrillation.  - Zio patch (9/19): few short NSVT runs, no atrial  fibrillation, 1.1% PVCs 10. Hematuria: Apparently had negative workup by urology.  11. ABIs (6/16) were normal 12. 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  13. Gout 14. Low back pain.  15. 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 CKD stage 3 17. Type 2 diabetes   SH: Separated, lives in [place], drinks ETOH occasionally, no drugs. Prior smoker.  Has son and daughter.     FH: No premature CAD.    Review of systems complete and found to be negative unless listed in HPI.   Current Outpatient Medications  Medication Sig Dispense Refill   albuterol (VENTOLIN HFA) 108 (90 Base) MCG/ACT inhaler INHALE 1-2 PUFFS INTO THE LUNGS EVERY 4 (FOUR) HOURS AS NEEDED FOR SHORTNESS OF BREATH OR WHEEZING. 8.5 each 1   amiodarone (PACERONE) 200 MG tablet Take 0.5 tablets (100 mg total) by mouth daily. Patient taking once daily 90 tablet 1   atorvastatin (LIPITOR) 40 MG tablet Take 1 tablet (40 mg total) by mouth daily. 90 tablet 3   carvedilol (COREG) 6.25 MG  tablet Take 1 tablet (6.25 mg total) by mouth 2 (two) times daily. 60 tablet 11   dapagliflozin propanediol (FARXIGA) 10 MG TABS tablet Take 1 tablet (10 mg total) by mouth daily. 90 tablet 1   pantoprazole (PROTONIX) 40 MG tablet TAKE 1 TABLET BY MOUTH EVERY DAY 90 tablet 0   potassium chloride SA (KLOR-CON M) 20 MEQ tablet Take 2 tablets (40 mEq total) by mouth daily. 180 tablet 3   PRESCRIPTION MEDICATION Inhale into the lungs See admin instructions. Bipap, pressure 16/12 with 2L of O2 - use whenever sleeping     sildenafil (VIAGRA) 50 MG tablet Take 50 mg by mouth as needed for erectile dysfunction.     spironolactone (ALDACTONE) 25 MG tablet Take 1 tablet (25 mg total) by mouth at bedtime. 90 tablet 3   tirzepatide (MOUNJARO) 12.5 MG/0.5ML Pen Inject 12.5 mg into the skin once a week. 2 mL 0   torsemide (DEMADEX) 20 MG tablet Take 40 mg by mouth every other day alternating with 20 mg every other day 60 tablet 6   XARELTO 20 MG TABS tablet Take 1 tablet (20 mg  total) by mouth daily with supper. 90 tablet 1   losartan (COZAAR) 25 MG tablet Take 0.5 tablets (12.5 mg total) by mouth at bedtime. (Patient not taking: Reported on 08/28/2023) 15 tablet 3   No current facility-administered medications for this encounter.   Allergies  Allergen Reactions   Ace Inhibitors Anaphylaxis and Swelling    Angioedema   Bidil [Isosorb Dinitrate-Hydralazine] Other (See Comments)    headache   Digoxin And Related     Unspecified "side effects"   Buspirone Other (See Comments)    dizziness   Social History   Socioeconomic History   Marital status: Married    Spouse name: Not on file   Number of children: 3   Years of education: Not on file   Highest education level: Not on file  Occupational History   Occupation: DISABLED  Tobacco Use   Smoking status: Some Days    Current packs/day: 0.00    Average packs/day: 0.5 packs/day for 30.0 years (15.0 ttl pk-yrs)    Types: Cigarettes    Start date: 05/15/1990    Last attempt to quit: 05/15/2020    Years since quitting: 3.2   Smokeless tobacco: Never   Tobacco comments:    vaping - nicotine-free products  Vaping Use   Vaping status: Never Used  Substance and Sexual Activity   Alcohol use: Not Currently    Alcohol/week: 0.0 standard drinks of alcohol   Drug use: No   Sexual activity: Not Currently  Other Topics Concern   Not on file  Social History Narrative   He smokes about a pack per day and he has been smoking since he was 57 years of age.  He drinks alcohol occasionally, but he denies any illicit drug abuse.  He is presently on disability.    Lives with wife in a 2 story home.  Has 2 children.   Previously worked in Office manager, last worked in 1998.   Highest level of education:  11th grade           Social Determinants of Health   Financial Resource Strain: Low Risk  (08/26/2022)   Overall Financial Resource Strain (CARDIA)    Difficulty of Paying Living Expenses: Not very hard  Food Insecurity:  No Food Insecurity (08/26/2022)   Hunger Vital Sign    Worried About Running Out of Food  in the Last Year: Never true    Ran Out of Food in the Last Year: Never true  Transportation Needs: No Transportation Needs (08/26/2022)   PRAPARE - Administrator, Civil Service (Medical): No    Lack of Transportation (Non-Medical): No  Physical Activity: Sufficiently Active (08/26/2022)   Exercise Vital Sign    Days of Exercise per Week: 5 days    Minutes of Exercise per Session: 30 min  Stress: No Stress Concern Present (08/26/2022)   [employer] of Occupational Health - Occupational Stress Questionnaire    Feeling of Stress : Only a little  Social Connections: Socially Isolated (08/26/2022)   Social Connection and Isolation Panel [NHANES]    Frequency of Communication with Friends and Family: Once a week    Frequency of Social Gatherings with Friends and Family: Never    Attends Religious Services: Never    Database administrator or Organizations: No    Attends Banker Meetings: Never    Marital Status: Separated  Intimate Partner Violence: Not At Risk (08/26/2022)   Humiliation, Afraid, Rape, and Kick questionnaire    Fear of Current or Ex-Partner: No    Emotionally Abused: No    Physically Abused: No    Sexually Abused: No   Family History  Problem Relation Age of Onset   Cancer Mother        brain tumor   Hypertension Mother    Diabetes Father        Deceased, 43   Heart disease Maternal Grandmother    Hypertension Other        Family History   Stroke Other        Family History   Diabetes Other        Family History   Diabetes Daughter    BP 102/68   Pulse 71   Wt (!) 143.8 kg (317 lb)   SpO2 97%   BMI 46.81 kg/m   Wt Readings from Last 3 Encounters:  08/28/23 (!) 143.8 kg (317 lb)  07/30/23 (!) 142.4 kg (314 lb)  07/03/23 (!) 141.5 kg (312 lb)   PHYSICAL EXAM: General: NAD, obese.  Neck: No JVD, no thyromegaly or thyroid nodule.  Lungs:  Clear to auscultation bilaterally with normal respiratory effort. CV: Nondisplaced PMI.  Heart regular S1/S2, no S3/S4, no murmur.  No peripheral edema.  No carotid bruit.  Normal pedal pulses.  Abdomen: Soft, nontender, no hepatosplenomegaly, no distention.  Skin: Intact without lesions or rashes.  Neurologic: Alert and oriented x 3.  Psych: Normal affect. Extremities: No clubbing or cyanosis.  HEENT: Normal.   ASSESSMENT & PLAN: 1. Atrial fibrillation: Paroyxsmal. Emergent DCCV in 6/22. EKG w/ atrial- sensed v paced rhythm. Device interrogation shows no significant AF episodes.   - Continue amiodarone 100 mg daily. Check LFTs today. Will need regular eye exam.  TSH was low in 8/24, needs repeat TSH with free T3 and free T4 today.  Refer to endocrinology if he has hyperthyroidism.  - Continue Xarelto. No bleeding issues. CBC today. - Based on body habitus, not good ablation candidate.  2.  Chronic systolic CHF: Nonischemic cardiomyopathy. ?If ETOH has played a role (now drinks rarely). Echo (3/18) with EF 25-30%. Echo 7/19 and in 2/21 with EF 20-25%. Echo 6/22 with EF < 20%, normal RV.  Has St Jude CRT-D device now.  Echo-post CRT (8/23) showed EF < 20%, mild MR, RV normal.  CPX in 9/23 with moderate functional limitation  due to HF and body habitus. Echo today showed  EF <20% with severe LV dilation, normal RV size/function, no significant MR.  NYHA class III symptoms.  Not volume overloaded by Optivol or exam.  He has had trouble tolerating GDMT due to low BP.  - Continue torsemide 40 mg daily alternating with 20 mg daily. BMET/BNP today.  - Unable to tolerate losartan due to low BP.  - he is only remembering to take Coreg 6.25 mg once daily. Stop Coreg, start bisoprolol 2.5 mg daily.  - Continue spironolactone 25 mg daily. - Continue Farxiga 10 mg daily. - No Entresto, ACEI with h/o angioedema.  - Headaches with Bidil, cannot take.  - He did not tolerate digoxin.   - Insurance denied  baroreceptor activation therapy.  - In the past, too heavy to consider LVAD or transplant.  He has lost a lot of weight, now down to 317 lbs (was > 400 lbs).  Think LVAD would be a potential option, we discussed this today.  I worry that he is nearing end-stage HF.  I will arrange for CPX and RHC to risk stratify him.  We discussed risks/benefits and he agrees to procedure. .   3. HTN: BP now runs low, limits GDMT.  4. 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  Has St Jude CRT-D device now. On amiodarone. No VT on device interrogation  - continue amiodarone 100 mg daily. See discussion above regarding thyroid labs. 5. OSA: reports full compliance w/ BiPAP.  - Continue nightly. 6. PE: 04/14/2018, diagnosed by V/Q scan. Has been on Xarelto. No bleeding issues. 7. Anxiety/Panic attacks/depression: Followed by psychiatry. 8. Hyperlipidemia:  LDL 65 (3/24) - Continue atorvastatin. 9. Obesity: Body mass index is 46.81 kg/m.  - He is now on Mounjaro. Has lost ~100 lb so far   10. Tobacco use: Back smoking. Discussed cessation. 11. Iron Deficiency Anemia: received dose of Feraheme 8/24 12. T2DM  - on Mounjaro and Farxiga - on statin + ARB ]  Followup after cath.  Marca Ancona  08/30/2023

## 2023-08-30 NOTE — Progress Notes (Signed)
Advanced Heart Failure Clinic Note   Primary Care: Dr. Sanda Linger HF Cardiologist: Dr. Shirlee Latch   HPI: [name] is a 57 y.o. male with a past medical history of NICM, EF 25-30% in March 2018, felt to be related to prior ETOH abuse. He also has a history of PE 03/2014 completed a years course of Xarelto, morbid obesity, OSA, and tobacco abuse.    He was admitted 11/12/15-11/14/15 with acute on chronic systolic CHF and palpitations. He wore a 30 day event monitor at discharge as he had frequent PVC's and questionable Afib on telemetry. Also with some NSVT, so he was started on Amiodarone, but at follow up had not started taking it. He had previously refused ICD and was seen inpatient by Dr. Johney Frame who felt that his morbid obesity was a prohibitive factor.    He was seen in the clinic in April 2018. He had started drinking ETOH again. Volume status was stable, he is not an Entresto candidate due to angioedema with lisinopril. Weight was 411 pounds.    Admitted 04/12/17 with SOB, chest pain. D- dimer was 1.16, chest CT without central obstructing PE, however more peripheral and subsegmental pulmonary artery branches were not confidently evaluated due to his body habitus. He was started on a heparin gtt for presumed PE, however VQ scan showed no PE. Troponin was elevated, peaked at 3.37. LHC showed no CAD. Echo showed an EF of 15%, grade 2 DD, no pericarditis. He was diuresed with IV lasix, and started on torsemide 20mg  at discharge. Discharge weight was 405 pounds.   Admitted 5/22 through 04/24/2018 with abdominal pain, nausea, and vomiting. 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 Did not require surgical intervention. Followed by outpatient GI.   Echo in 2/21 with EF 20-25%, severe LV dilation.   Presented 05/09/21 w/ SOB 2/2 acute CHF w ? PNA.  CTA showed multifocal ?PNA, no PE. Also in Afib w/ RVR in 150s on admit. Initially required bipap, developed hemoptysis and intubated. Treated w/ IV Lasix. Echo  05/10/21: LVEF < 20%. RV moderately reduced. Improved and was extubated 6/13. Went into Afib w/ RVR. Refused to wear BiPAP. Developed severe agitation/hypoxia and re-intubated.  Went into VT>>VF arrest, treated w/ ACLS>>ROSC after . Developed refractory AFib again next morning requiring emergent cardioversion 05/16/21. He was transitioned to PO amiodarone. HR remained in 40-50's, metoprolol and sildenafil were stopped. Discharge weight 367 lbs.  St Jude CRT-D device placed in 2/23.  Echo 8/23 showed EF <20%, mild MR, RV normal.  CPX in 9/23 showed moderate functional limitation due to HF and body habitus.   Echo was done today and reviewed, EF <20% with severe LV dilation, normal RV size/function, no significant MR.   Patient returns today for followup of CHF.  He has had a significant weight loss over the last year with Mounjaro, weight used to be > 400 lbs.  He reports significant fatigue with exertion.  Occasional palpitations.  Does not get particularly short of breath walking but tires quickly.  Has occasional GERD-type chest pain after meals.  Using Bipap regularly.  Cannot take even low dose losartan due to orthostatic symptoms. Only can remember to take Coreg once a day.    St Jude device interrogation: 95% BiV pacing, 59% a-pacing, stable thoracic impedance, no VT/AF.    ECG (personally reviewed): NSR with BiV pacing  Labs (1/19): K 3.8 Creatinine 1.25  Labs (2/19): K 3.8 Creatinine 1.08 Labs (4/19): K 4.1 creatinine 1/15 BNP 212  Labs (5/19): creatinine  1.1  Labs (10/19): LDL 166, K 3.8, creatinine 1.01, AST 102 => 25, ALT 125 => 37, alkaline phosphatase 233 Labs (11/20): LDL 106, TGs 232 Labs (2/21): K 3.3, creatinine 1.3 Labs (4/21): LDL 114, TGs 466, K 3.6, creatinine 1.22 Labs (12/21): K 3.5, creatinine 1.08, LDL 164, TGs 133 Labs (722): K 3.8, creatinine 1.4 Labs (8/22): K 3.8, creatinine 1.23 Labs (9/22): K 3.6, creatinine 1.26 Labs (2/23): LFTs normal, TSH normal, K 4,  creatinine 1.3 Labs (8/23): K 3.4, creatinine 1.49 Labs (10/23): K 3.8, creatinine 1.72 Labs (11/23): LFTs normal, TSH normal Labs (1/24): K 3.9, creatinine 1.53 Labs (3/24): K 4.2, creatinine 1.86, LDL 65 Labs (7/24): K 3.5, creatinine 1.10, BNP 241, Hgb 14.5, Hgb A1c 5.5  Labs (8/24): transferrin saturation 22%, BNP 168, TSH < 0.01, K 3.2, creatinine 1.62, LFTs normal  PMH: 1. Nonischemic cardiomyopathy: Prior cath with no significant CAD.  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  Prior echoes with EF as low as 25%.  Echo (9/13) with EF 35-40%, moderate to severe LV dilation, diffuse hypokinesis, mild MR. Echo (5/15) with EF 30-35%, moderate to severe LAE, normal RV size and systolic function.  Angioedema with ACEI, headaches with hydralazine/nitrates. Echo (3/16) with EF 25-30%, severe LV dilation, normal RV size and systolic function.  [facility] 08/13/15 showed no significant coronary disease; RA mean 6, PA 33/11 mean 23, PCWP mean 13, Fick CO/CI 4.75 /1.68 (difficult study, radial artery spasm, if needs future cath would use groin).  Echo (9/16) showed EF 20-25%.   - Echo (3/18): EF 25-30%, moderate LAE - Echo 4/18 EF 15% - Echo 7/19 EF 20-25% - Echo 2/21 with EF 20-25%, severe LV dilation.  - Echo 6/22 with EF < 20%, normal RV. - St Jude CRT-D 2/23.  - Echo (8/23): EF < 20%, mild MR, RV normal.  - CPX (9/23): Peak VO2 13.1, VE/VCO2 slope 37, RER 1.10.  Moderate functional limitation, body habitus and HF.  - Echo (9/24): EF <20% with severe LV dilation, normal RV size/function, no significant MR.  2. HTN: angioedema with ACEI.  3. OSA: on Bipap 4. Morbid obesity 5. Paroxysmal atrial fibrillation: Urgent DCCV 6/22. 6. Smoker.  7. Anxiety/panic attacks 8. PE: 5/15, diagnosed by V/Q scan. CTA chest 8/16 negative for PE.  9. NSVT, PVCs: 30 day monitor (12/16) with PVCs, PACs, no atrial fibrillation.  - Zio patch (9/19): few short NSVT runs, no atrial  fibrillation, 1.1% PVCs 10. Hematuria: Apparently had negative workup by urology.  11. ABIs (6/16) were normal 12. 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  13. Gout 14. Low back pain.  15. 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 CKD stage 3 17. Type 2 diabetes   SH: Separated, lives in [place], drinks ETOH occasionally, no drugs. Prior smoker.  Has son and daughter.     FH: No premature CAD.    Review of systems complete and found to be negative unless listed in HPI.   Current Outpatient Medications  Medication Sig Dispense Refill   albuterol (VENTOLIN HFA) 108 (90 Base) MCG/ACT inhaler INHALE 1-2 PUFFS INTO THE LUNGS EVERY 4 (FOUR) HOURS AS NEEDED FOR SHORTNESS OF BREATH OR WHEEZING. 8.5 each 1   amiodarone (PACERONE) 200 MG tablet Take 0.5 tablets (100 mg total) by mouth daily. Patient taking once daily 90 tablet 1   atorvastatin (LIPITOR) 40 MG tablet Take 1 tablet (40 mg total) by mouth daily. 90 tablet 3   carvedilol (COREG) 6.25 MG  tablet Take 1 tablet (6.25 mg total) by mouth 2 (two) times daily. 60 tablet 11   dapagliflozin propanediol (FARXIGA) 10 MG TABS tablet Take 1 tablet (10 mg total) by mouth daily. 90 tablet 1   pantoprazole (PROTONIX) 40 MG tablet TAKE 1 TABLET BY MOUTH EVERY DAY 90 tablet 0   potassium chloride SA (KLOR-CON M) 20 MEQ tablet Take 2 tablets (40 mEq total) by mouth daily. 180 tablet 3   PRESCRIPTION MEDICATION Inhale into the lungs See admin instructions. Bipap, pressure 16/12 with 2L of O2 - use whenever sleeping     sildenafil (VIAGRA) 50 MG tablet Take 50 mg by mouth as needed for erectile dysfunction.     spironolactone (ALDACTONE) 25 MG tablet Take 1 tablet (25 mg total) by mouth at bedtime. 90 tablet 3   tirzepatide (MOUNJARO) 12.5 MG/0.5ML Pen Inject 12.5 mg into the skin once a week. 2 mL 0   torsemide (DEMADEX) 20 MG tablet Take 40 mg by mouth every other day alternating with 20 mg every other day 60 tablet 6   XARELTO 20 MG TABS tablet Take 1 tablet (20 mg  total) by mouth daily with supper. 90 tablet 1   losartan (COZAAR) 25 MG tablet Take 0.5 tablets (12.5 mg total) by mouth at bedtime. (Patient not taking: Reported on 08/28/2023) 15 tablet 3   No current facility-administered medications for this encounter.   Allergies  Allergen Reactions   Ace Inhibitors Anaphylaxis and Swelling    Angioedema   Bidil [Isosorb Dinitrate-Hydralazine] Other (See Comments)    headache   Digoxin And Related     Unspecified "side effects"   Buspirone Other (See Comments)    dizziness   Social History   Socioeconomic History   Marital status: Married    Spouse name: Not on file   Number of children: 3   Years of education: Not on file   Highest education level: Not on file  Occupational History   Occupation: DISABLED  Tobacco Use   Smoking status: Some Days    Current packs/day: 0.00    Average packs/day: 0.5 packs/day for 30.0 years (15.0 ttl pk-yrs)    Types: Cigarettes    Start date: 05/15/1990    Last attempt to quit: 05/15/2020    Years since quitting: 3.2   Smokeless tobacco: Never   Tobacco comments:    vaping - nicotine-free products  Vaping Use   Vaping status: Never Used  Substance and Sexual Activity   Alcohol use: Not Currently    Alcohol/week: 0.0 standard drinks of alcohol   Drug use: No   Sexual activity: Not Currently  Other Topics Concern   Not on file  Social History Narrative   He smokes about a pack per day and he has been smoking since he was 57 years of age.  He drinks alcohol occasionally, but he denies any illicit drug abuse.  He is presently on disability.    Lives with wife in a 2 story home.  Has 2 children.   Previously worked in Office manager, last worked in 1998.   Highest level of education:  11th grade           Social Determinants of Health   Financial Resource Strain: Low Risk  (08/26/2022)   Overall Financial Resource Strain (CARDIA)    Difficulty of Paying Living Expenses: Not very hard  Food Insecurity:  No Food Insecurity (08/26/2022)   Hunger Vital Sign    Worried About Running Out of Food  in the Last Year: Never true    Ran Out of Food in the Last Year: Never true  Transportation Needs: No Transportation Needs (08/26/2022)   PRAPARE - Administrator, Civil Service (Medical): No    Lack of Transportation (Non-Medical): No  Physical Activity: Sufficiently Active (08/26/2022)   Exercise Vital Sign    Days of Exercise per Week: 5 days    Minutes of Exercise per Session: 30 min  Stress: No Stress Concern Present (08/26/2022)   [employer] of Occupational Health - Occupational Stress Questionnaire    Feeling of Stress : Only a little  Social Connections: Socially Isolated (08/26/2022)   Social Connection and Isolation Panel [NHANES]    Frequency of Communication with Friends and Family: Once a week    Frequency of Social Gatherings with Friends and Family: Never    Attends Religious Services: Never    Database administrator or Organizations: No    Attends Banker Meetings: Never    Marital Status: Separated  Intimate Partner Violence: Not At Risk (08/26/2022)   Humiliation, Afraid, Rape, and Kick questionnaire    Fear of Current or Ex-Partner: No    Emotionally Abused: No    Physically Abused: No    Sexually Abused: No   Family History  Problem Relation Age of Onset   Cancer Mother        brain tumor   Hypertension Mother    Diabetes Father        Deceased, 43   Heart disease Maternal Grandmother    Hypertension Other        Family History   Stroke Other        Family History   Diabetes Other        Family History   Diabetes Daughter    BP 102/68   Pulse 71   Wt (!) 143.8 kg (317 lb)   SpO2 97%   BMI 46.81 kg/m   Wt Readings from Last 3 Encounters:  08/28/23 (!) 143.8 kg (317 lb)  07/30/23 (!) 142.4 kg (314 lb)  07/03/23 (!) 141.5 kg (312 lb)   PHYSICAL EXAM: General: NAD, obese.  Neck: No JVD, no thyromegaly or thyroid nodule.  Lungs:  Clear to auscultation bilaterally with normal respiratory effort. CV: Nondisplaced PMI.  Heart regular S1/S2, no S3/S4, no murmur.  No peripheral edema.  No carotid bruit.  Normal pedal pulses.  Abdomen: Soft, nontender, no hepatosplenomegaly, no distention.  Skin: Intact without lesions or rashes.  Neurologic: Alert and oriented x 3.  Psych: Normal affect. Extremities: No clubbing or cyanosis.  HEENT: Normal.   ASSESSMENT & PLAN: 1. Atrial fibrillation: Paroyxsmal. Emergent DCCV in 6/22. EKG w/ atrial- sensed v paced rhythm. Device interrogation shows no significant AF episodes.   - Continue amiodarone 100 mg daily. Check LFTs today. Will need regular eye exam.  TSH was low in 8/24, needs repeat TSH with free T3 and free T4 today.  Refer to endocrinology if he has hyperthyroidism.  - Continue Xarelto. No bleeding issues. CBC today. - Based on body habitus, not good ablation candidate.  2.  Chronic systolic CHF: Nonischemic cardiomyopathy. ?If ETOH has played a role (now drinks rarely). Echo (3/18) with EF 25-30%. Echo 7/19 and in 2/21 with EF 20-25%. Echo 6/22 with EF < 20%, normal RV.  Has St Jude CRT-D device now.  Echo-post CRT (8/23) showed EF < 20%, mild MR, RV normal.  CPX in 9/23 with moderate functional limitation  due to HF and body habitus. Echo today showed  EF <20% with severe LV dilation, normal RV size/function, no significant MR.  NYHA class III symptoms.  Not volume overloaded by Optivol or exam.  He has had trouble tolerating GDMT due to low BP.  - Continue torsemide 40 mg daily alternating with 20 mg daily. BMET/BNP today.  - Unable to tolerate losartan due to low BP.  - he is only remembering to take Coreg 6.25 mg once daily. Stop Coreg, start bisoprolol 2.5 mg daily.  - Continue spironolactone 25 mg daily. - Continue Farxiga 10 mg daily. - No Entresto, ACEI with h/o angioedema.  - Headaches with Bidil, cannot take.  - He did not tolerate digoxin.   - Insurance denied  baroreceptor activation therapy.  - In the past, too heavy to consider LVAD or transplant.  He has lost a lot of weight, now down to 317 lbs (was > 400 lbs).  Think LVAD would be a potential option, we discussed this today.  I worry that he is nearing end-stage HF.  I will arrange for CPX and RHC to risk stratify him.  We discussed risks/benefits and he agrees to procedure. .   3. HTN: BP now runs low, limits GDMT.  4. 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  Has St Jude CRT-D device now. On amiodarone. No VT on device interrogation  - continue amiodarone 100 mg daily. See discussion above regarding thyroid labs. 5. OSA: reports full compliance w/ BiPAP.  - Continue nightly. 6. PE: 04/14/2018, diagnosed by V/Q scan. Has been on Xarelto. No bleeding issues. 7. Anxiety/Panic attacks/depression: Followed by psychiatry. 8. Hyperlipidemia:  LDL 65 (3/24) - Continue atorvastatin. 9. Obesity: Body mass index is 46.81 kg/m.  - He is now on Mounjaro. Has lost ~100 lb so far   10. Tobacco use: Back smoking. Discussed cessation. 11. Iron Deficiency Anemia: received dose of Feraheme 8/24 12. T2DM  - on Mounjaro and Farxiga - on statin + ARB ]  Followup after cath.  Marca Ancona  08/30/2023

## 2023-08-31 ENCOUNTER — Telehealth (HOSPITAL_COMMUNITY): Payer: Self-pay

## 2023-08-31 MED ORDER — METHIMAZOLE 10 MG PO TABS: 10.0000 mg | ORAL_TABLET | Freq: Every day | ORAL | 3 refills | Status: DC

## 2023-08-31 NOTE — Telephone Encounter (Addendum)
Pt aware, agreeable, and verbalized understanding  Rx sent in, referral placed   ----- Message from Marca Ancona sent at 08/30/2023  7:15 PM EDT ----- Hyperthyroidism.  Start methimazole 10 mg daily and make sure he gets an appointment with endocrinology asap.

## 2023-09-02 ENCOUNTER — Telehealth (HOSPITAL_COMMUNITY): Payer: Self-pay

## 2023-09-02 ENCOUNTER — Telehealth: Payer: Self-pay | Admitting: *Deleted

## 2023-09-02 NOTE — Telephone Encounter (Signed)
  ADVANCED HEART FAILURE CLINIC   Pre-operative Risk Assessment   HEARTCARE STAFF-IMPORTANT INSTRUCTIONS 1 Red and Blue Text will auto delete once note is signed or closed. 2 Press F2 to navigate through template.   3 On drop down lists, L click to select >> R click to activate next field 4 Reason for Visit format is IMPORTANT!!  See Directions on No. 2 below. 5 Please review chart to determine if there is already a clearance note open for this procedure!!  DO NOT duplicate if a note already exists!!    :1}     Request for Surgical Clearance    Procedure:   Deep Cleaning, fillings, crowns or bridges  Date of Surgery:  Clearance TBD                                 Surgeon:  Dr. [place] Thoms Surgeon's Group or Practice Name:  [facility] Phone number:  [phone] Fax number:  [phone]   Type of Clearance Requested:   - Medical    Type of Anesthesia:  Local   Additional requests/questions:  Please fax a copy of medical clearance  to the surgeon's office.  Signed, Linda Hedges   09/02/2023, 9:38 AM   Advanced Heart Failure Clinic [place] Rome, FNP [facility] [address] [facility] [place] [address] [phone] (office) [phone] (fax)

## 2023-09-02 NOTE — Telephone Encounter (Signed)
Medical clearance form faxed to [facility] via Epic

## 2023-09-04 ENCOUNTER — Telehealth: Payer: Self-pay | Admitting: Pharmacist

## 2023-09-04 NOTE — Telephone Encounter (Signed)
Called pt to follow up with [facility]. Reports he wants to stay on 12.5mg  for another month, pharmacy advised him they have rx ready for pick up. Will call again in a month.

## 2023-09-09 ENCOUNTER — Telehealth: Payer: Self-pay | Admitting: Internal Medicine

## 2023-09-09 ENCOUNTER — Other Ambulatory Visit: Payer: Self-pay | Admitting: Internal Medicine

## 2023-09-09 DIAGNOSIS — E611 Iron deficiency: Secondary | ICD-10-CM

## 2023-09-09 NOTE — Telephone Encounter (Signed)
Patient called and said Dr. Yetta Barre prescribed iron medication a few months ago. He said it's not covered by his insurance and will cost over $2000. He wanted to know if there was an alternative he could get. Best callback is [phone].

## 2023-09-15 ENCOUNTER — Other Ambulatory Visit: Payer: Self-pay

## 2023-09-15 ENCOUNTER — Ambulatory Visit (INDEPENDENT_AMBULATORY_CARE_PROVIDER_SITE_OTHER): Payer: Medicare HMO

## 2023-09-15 ENCOUNTER — Ambulatory Visit (HOSPITAL_COMMUNITY)
Admission: RE | Admit: 2023-09-15 | Discharge: 2023-09-15 | Disposition: A | Discharge status: Home / Self Care | Payer: Medicare HMO | Source: Ambulatory Visit | Attending: Cardiology | Admitting: Cardiology

## 2023-09-15 ENCOUNTER — Encounter (HOSPITAL_COMMUNITY)
Admission: RE | Disposition: A | Discharge status: Home / Self Care | Payer: Self-pay | Source: Ambulatory Visit | Attending: Cardiology

## 2023-09-15 DIAGNOSIS — Z95810 Presence of automatic (implantable) cardiac defibrillator: Secondary | ICD-10-CM | POA: Diagnosis not present

## 2023-09-15 DIAGNOSIS — E669 Obesity, unspecified: Secondary | ICD-10-CM | POA: Insufficient documentation

## 2023-09-15 DIAGNOSIS — I272 Pulmonary hypertension, unspecified: Secondary | ICD-10-CM | POA: Diagnosis not present

## 2023-09-15 DIAGNOSIS — E785 Hyperlipidemia, unspecified: Secondary | ICD-10-CM | POA: Insufficient documentation

## 2023-09-15 DIAGNOSIS — Z6841 Body Mass Index (BMI) 40.0 and over, adult: Secondary | ICD-10-CM | POA: Diagnosis not present

## 2023-09-15 DIAGNOSIS — G4733 Obstructive sleep apnea (adult) (pediatric): Secondary | ICD-10-CM | POA: Insufficient documentation

## 2023-09-15 DIAGNOSIS — Z7984 Long term (current) use of oral hypoglycemic drugs: Secondary | ICD-10-CM | POA: Insufficient documentation

## 2023-09-15 DIAGNOSIS — Z7985 Long-term (current) use of injectable non-insulin antidiabetic drugs: Secondary | ICD-10-CM | POA: Insufficient documentation

## 2023-09-15 DIAGNOSIS — Z79899 Other long term (current) drug therapy: Secondary | ICD-10-CM | POA: Diagnosis not present

## 2023-09-15 DIAGNOSIS — Z7901 Long term (current) use of anticoagulants: Secondary | ICD-10-CM | POA: Diagnosis not present

## 2023-09-15 DIAGNOSIS — I110 Hypertensive heart disease with heart failure: Secondary | ICD-10-CM | POA: Insufficient documentation

## 2023-09-15 DIAGNOSIS — F17210 Nicotine dependence, cigarettes, uncomplicated: Secondary | ICD-10-CM | POA: Diagnosis not present

## 2023-09-15 DIAGNOSIS — D509 Iron deficiency anemia, unspecified: Secondary | ICD-10-CM | POA: Diagnosis not present

## 2023-09-15 DIAGNOSIS — E119 Type 2 diabetes mellitus without complications: Secondary | ICD-10-CM | POA: Diagnosis not present

## 2023-09-15 DIAGNOSIS — I5042 Chronic combined systolic (congestive) and diastolic (congestive) heart failure: Secondary | ICD-10-CM | POA: Diagnosis not present

## 2023-09-15 DIAGNOSIS — I428 Other cardiomyopathies: Secondary | ICD-10-CM | POA: Insufficient documentation

## 2023-09-15 DIAGNOSIS — I509 Heart failure, unspecified: Secondary | ICD-10-CM | POA: Diagnosis not present

## 2023-09-15 DIAGNOSIS — F32A Depression, unspecified: Secondary | ICD-10-CM | POA: Diagnosis not present

## 2023-09-15 DIAGNOSIS — I480 Paroxysmal atrial fibrillation: Secondary | ICD-10-CM | POA: Diagnosis not present

## 2023-09-15 DIAGNOSIS — I5022 Chronic systolic (congestive) heart failure: Secondary | ICD-10-CM | POA: Insufficient documentation

## 2023-09-15 DIAGNOSIS — F410 Panic disorder [episodic paroxysmal anxiety] without agoraphobia: Secondary | ICD-10-CM | POA: Diagnosis not present

## 2023-09-15 DIAGNOSIS — F419 Anxiety disorder, unspecified: Secondary | ICD-10-CM | POA: Insufficient documentation

## 2023-09-15 HISTORY — PX: RIGHT HEART CATH: CATH118263

## 2023-09-15 LAB — GLUCOSE, CAPILLARY
Glucose-Capillary: 173 mg/dL — ABNORMAL HIGH (ref 70–99)
Glucose-Capillary: 85 mg/dL (ref 70–99)

## 2023-09-15 LAB — POCT I-STAT EG7
Acid-Base Excess: 4 mmol/L — ABNORMAL HIGH (ref 0.0–2.0)
Acid-Base Excess: 4 mmol/L — ABNORMAL HIGH (ref 0.0–2.0)
Bicarbonate: 28.1 mmol/L — ABNORMAL HIGH (ref 20.0–28.0)
Bicarbonate: 28.4 mmol/L — ABNORMAL HIGH (ref 20.0–28.0)
Calcium, Ion: 1.17 mmol/L (ref 1.15–1.40)
Calcium, Ion: 1.18 mmol/L (ref 1.15–1.40)
HCT: 46 % (ref 39.0–52.0)
HCT: 47 % (ref 39.0–52.0)
Hemoglobin: 15.6 g/dL (ref 13.0–17.0)
Hemoglobin: 16 g/dL (ref 13.0–17.0)
O2 Saturation: 65 %
O2 Saturation: 65 %
Potassium: 3.5 mmol/L (ref 3.5–5.1)
Potassium: 3.6 mmol/L (ref 3.5–5.1)
Sodium: 138 mmol/L (ref 135–145)
Sodium: 138 mmol/L (ref 135–145)
TCO2: 29 mmol/L (ref 22–32)
TCO2: 30 mmol/L (ref 22–32)
pCO2, Ven: 40.6 mm[Hg] — ABNORMAL LOW (ref 44–60)
pCO2, Ven: 40.8 mm[Hg] — ABNORMAL LOW (ref 44–60)
pH, Ven: 7.448 — ABNORMAL HIGH (ref 7.25–7.43)
pH, Ven: 7.451 — ABNORMAL HIGH (ref 7.25–7.43)
pO2, Ven: 32 mm[Hg] (ref 32–45)
pO2, Ven: 32 mm[Hg] (ref 32–45)

## 2023-09-15 SURGERY — RIGHT HEART CATH
Anesthesia: LOCAL

## 2023-09-15 MED ORDER — MIDAZOLAM HCL 2 MG/2ML IJ SOLN
INTRAMUSCULAR | Status: DC | PRN
  Administered 2023-09-15: 1 mg via INTRAVENOUS

## 2023-09-15 MED ORDER — LABETALOL HCL 5 MG/ML IV SOLN: 10.0000 mg | INTRAVENOUS | Status: AC | PRN

## 2023-09-15 MED ORDER — HYDRALAZINE HCL 20 MG/ML IJ SOLN: 10.0000 mg | INTRAMUSCULAR | Status: AC | PRN

## 2023-09-15 MED ORDER — SODIUM CHLORIDE 0.9 % IV SOLN: 250.0000 mL | INTRAVENOUS | Status: DC | PRN

## 2023-09-15 MED ORDER — LIDOCAINE HCL (PF) 1 % IJ SOLN
INTRAMUSCULAR | Status: DC | PRN
  Administered 2023-09-15: 2 mL

## 2023-09-15 MED ORDER — MIDAZOLAM HCL 2 MG/2ML IJ SOLN
INTRAMUSCULAR | Status: AC
  Filled 2023-09-15: qty 2

## 2023-09-15 MED ORDER — SODIUM CHLORIDE 0.9 % IV SOLN: INTRAVENOUS | Status: DC

## 2023-09-15 MED ORDER — ACETAMINOPHEN 325 MG PO TABS: 650.0000 mg | ORAL_TABLET | ORAL | Status: DC | PRN

## 2023-09-15 MED ORDER — SODIUM CHLORIDE 0.9% FLUSH: 3.0000 mL | Freq: Two times a day (BID) | INTRAVENOUS | Status: DC

## 2023-09-15 MED ORDER — LIDOCAINE HCL (PF) 1 % IJ SOLN
INTRAMUSCULAR | Status: AC
  Filled 2023-09-15: qty 30

## 2023-09-15 MED ORDER — HEPARIN (PORCINE) IN NACL 1000-0.9 UT/500ML-% IV SOLN
INTRAVENOUS | Status: DC | PRN
  Administered 2023-09-15: 500 mL

## 2023-09-15 MED ORDER — SODIUM CHLORIDE 0.9% FLUSH: 3.0000 mL | INTRAVENOUS | Status: DC | PRN

## 2023-09-15 MED ORDER — TORSEMIDE 20 MG PO TABS: ORAL_TABLET | ORAL | 6 refills | Status: DC

## 2023-09-15 MED ORDER — ONDANSETRON HCL 4 MG/2ML IJ SOLN: 4.0000 mg | Freq: Four times a day (QID) | INTRAMUSCULAR | Status: DC | PRN

## 2023-09-15 SURGICAL SUPPLY — 7 items
CATH SWAN GANZ 7F STRAIGHT (CATHETERS) IMPLANT
GLIDESHEATH SLENDER 7FR .021G (SHEATH) IMPLANT
GUIDEWIRE .025 260CM (WIRE) IMPLANT
PACK CARDIAC CATHETERIZATION (CUSTOM PROCEDURE TRAY) ×1 IMPLANT
TRANSDUCER W/STOPCOCK (MISCELLANEOUS) IMPLANT
TUBING ART PRESS 72 MALE/FEM (TUBING) IMPLANT
WIRE MICROINTRODUCER 60CM (WIRE) IMPLANT

## 2023-09-15 NOTE — Interval H&P Note (Signed)
History and Physical Interval Note:  09/15/2023 2:08 PM  [name]  has presented today for surgery, with the diagnosis of heart failure.  The various methods of treatment have been discussed with the patient and family. After consideration of risks, benefits and other options for treatment, the patient has consented to  Procedure(s): RIGHT HEART CATH (N/A) as a surgical intervention.  The patient's history has been reviewed, patient examined, no change in status, stable for surgery.  I have reviewed the patient's chart and labs.  Questions were answered to the patient's satisfaction.     Arwa Yero [employer]

## 2023-09-15 NOTE — Discharge Instructions (Addendum)
1. Restart Xarelto this evening.  2. He needs to increase torsemide to 40 mg (2 tabs) every morning and 20 mg (1 tab) every evening.      Brachial Site Care   This sheet gives you information about how to care for yourself after your procedure. Your health care provider may also give you more specific instructions. If you have problems or questions, contact your health care provider. What can I expect after the procedure? After the procedure, it is common to have: Bruising and tenderness at the catheter insertion area. Follow these instructions at home:  Insertion site care Follow instructions from your health care provider about how to take care of your insertion site. Make sure you: Wash your hands with soap and water before you change your bandage (dressing). If soap and water are not available, use hand sanitizer. Remove your dressing as told by your health care provider. In 24 hours Check your insertion site every day for signs of infection. Check for: Redness, swelling, or pain. Pus or a bad smell. Warmth. You may shower 24-48 hours after the procedure. Do not apply powder or lotion to the site.  Activity For 24 hours after the procedure, or as directed by your health care provider: Do not push or pull heavy objects with the affected arm. Do not drive yourself home from the hospital or clinic. You may drive 24 hours after the procedure unless your health care provider tells you not to. Do not lift anything that is heavier than 10 lb (4.5 kg), or the limit that you are told, until your health care provider says that it is safe.  For 24 hours

## 2023-09-16 ENCOUNTER — Encounter (HOSPITAL_COMMUNITY): Payer: Self-pay | Admitting: Cardiology

## 2023-09-16 NOTE — Progress Notes (Signed)
EPIC Encounter for ICM Monitoring  Patient Name: [name] is a 57 y.o. male Date: 09/16/2023 Primary Care Physican: Etta Grandchild, MD Primary Cardiologist: Shirlee Latch Electrophysiologist: Townsend Roger Pacing: 95% 02/04/2023 Weight:  358 lbs       06/01/2023 Weight: 328 lbs (losing weight taking Mounjaro)   06/09/2023 Weight: 328 lbs             07/07/2023 Weight: 308 lbs   9/10/202 Weight: 308 lbs      09/16/2023 Weight: 317 lbs      Spoke with patient and heart failure questions reviewed.  Transmission results reviewed.  Pt had heart Cath 10/15.  He stated he may need a heart pump but will find out more about that later.  He was told that he still had fluid accumulation when the cath was done and Torsemide dosage was increased.  Weight has increased.   CorVue thoracic impedance suggesting possible fluid accumulation from 9/28-10/13 followed by possible dryness starting 10/14.   Prescribed:  Torsemide 20 mg Take 2 tablet(s) (40 mg total) every morning and 1 tablet (20 mg total) every afternoon Potassium 20 mEq take 2 tablet(s) (40 mEq total) by mouth daily. Spironolactone 25 mg take 1 tablet daily   Labs: 09/15/2023 Potassium 3.6, Sodium 138 08/28/2023 Creatinine 1.39, BUN 15, Potassium 3.6, Sodium 136, GFR >60 07/30/2023 Creatinine 1.62, BUN 24, Potassium 3.2, Sodium 136, GFR 50 06/16/2023 Creatinine 1.10, BUN 13, Potassium 3.5, Sodium 136, GFR >60 02/06/2023 Creatinine 1.86, BUN 24, Potassium 4.2, Sodium 138, GFR 40.56 01/09/2023 Creatinine 1.68, BUN 18, Potassium 3.2, Sodium 139, GFR 48  12/23/2022 Creatinine 1.53, BUN 15, Potassium 3.9, Sodium 137, GFR 54  A complete set of results can be found in Results Review.   Recommendations:  Recommendations given at Heart Cath discharge.   Advised patient will follow up next week.     Follow-up plan: ICM clinic phone appointment 09/21/2023 to recheck fluid levels   91 day device clinic remote transmission 10/27/2023.     EP/Cardiology  Office Visits:   10/07/2023 with HF clinic.  Recall 08/09/2023 with Francis Dowse, PA   Copy of ICM check sent to Dr. Lalla Brothers.     3 month ICM trend: 09/14/2023.    12-14 Month ICM trend:     Karie Soda, RN 09/16/2023 8:45 AM

## 2023-09-17 ENCOUNTER — Telehealth: Payer: Self-pay | Admitting: Oncology

## 2023-09-17 ENCOUNTER — Ambulatory Visit: Payer: Medicare HMO

## 2023-09-17 ENCOUNTER — Other Ambulatory Visit (HOSPITAL_COMMUNITY): Payer: Self-pay | Admitting: Unknown Physician Specialty

## 2023-09-17 ENCOUNTER — Encounter (HOSPITAL_COMMUNITY): Payer: Self-pay | Admitting: Unknown Physician Specialty

## 2023-09-17 VITALS — Ht 69.0 in | Wt 313.0 lb

## 2023-09-17 DIAGNOSIS — Z0000 Encounter for general adult medical examination without abnormal findings: Secondary | ICD-10-CM

## 2023-09-17 DIAGNOSIS — I5042 Chronic combined systolic (congestive) and diastolic (congestive) heart failure: Secondary | ICD-10-CM

## 2023-09-17 NOTE — Progress Notes (Signed)
[facility] EDUCATION NOTE:                VAD evaluation consent reviewed and signed by [name].  Initial VAD teaching completed with pt and caregiver.   VAD educational packet including "Understanding Your Options with Advanced Heart Failure", "[name] Patient Agreement for VAD Evaluation and Potential Implantation" consent, and [employer] "Heartmate 3 Left Ventricular Device (LVAD) Patient Guide", Heartmate 3 Left Ventricular Assist System Patient Education Program DVD", "[place] HM III Patient Education", "[place] Mechanical Circulatory Support Program", and "Decision Aids for Left Ventricular Assist Device" reviewed in detail and left at bedside for continued reference.   All questions answered regarding VAD implant, hospital stay, and what to expect when discharged home living with a heart pump. Pt identified his ex-wife [name] as his primary caregiver if this therapy should be deemed appropriate for him.  Explained need for 24/7 care when pt is discharged home due to sternal precautions, adaptation to living on support, emotional support, consistent and meticulous exit site care and management, medication adherence and high volume of follow up visits with the VAD Clinic after discharge; both pt and caregiver verbalized understanding of above.   Explained that LVAD can be implanted for two indications in the setting of advanced left ventricular heart failure treatment:  Bridge to transplant - used for patients who cannot safely wait for heart transplant without this device.  Or    Destination therapy - used for patients until end of life or recovery of heart function.  Patient and caregiver(s) acknowledge that the indication at this point in time for LVAD therapy would be for DT due to BMI of 46.2.   Reviewed and supplied a copy of home inspection check list stressing that only three pronged grounded power outlets can be used for VAD equipment. Adam Gratz lives with this ex-wife and daughter and  confirmed home has electrical outlets that will support the equipment along with access working telephone.  Identified the following lifestyle modifications while living on MCS:    1. No driving for at least three months and then only if doctor gives permission to do so.   2. No tub baths while pump implanted, and shower only when doctor gives permission.   3. No swimming or submersion in water while implanted with pump.   4. No contact sports or engaging in jumping activities.   5. Always have a backup controller, charged spare batteries, and battery clips nearby at all times in case of emergency.   6. Call the doctor or hospital contact person if any change in how the pump sounds, feels, or works.   7. Plan to sleep only when connected to the power module.   8. Do not sleep on your stomach.   9. Keep a backup system controller, charged batteries, battery clips, and flashlight near you during sleep in case of electrical power outage.   10. Exit site care including dressing changes, monitoring for infection, and importance of keeping percutaneous lead stabilized at all times.    Reviewed pictures of VAD drive line, site care, dressing changes, and drive line stabilization including securement attachment device and abdominal binder. Discussed with pt and family that they will be required to purchase dressing supplies as long as patient has the VAD in place.   He will also need to abide by sternal precautions with no lifting >10lbs, pushing, pulling and will need assistance with adapting to new life style with VAD equipment and care.  Intermacs patient survival statistics through June 2024 reviewed with patient and caregiver as follows:    The patient understands that from this discussion it does not mean that they will receive the device, but that depends on an extensive evaluation process. The patient is aware of the fact that if at anytime they want to stop the evaluation process they  can.  All questions have been answered at this time and contact information was provided should they encounter any further questions.  They are both agreeable at this time to the evaluation process and will move forward.   We will work on scheduling the pts evaluation tests for next week. Pt would like a second opinion from the [facility] heart failure team. VAD coordinator informed pt we can help arrange this referral.   Carlton Adam, RN VAD Coordinator   Office: [phone] 24/7 VAD Pager: [phone]

## 2023-09-17 NOTE — Patient Instructions (Signed)
[name] , Thank you for taking time to come for your Medicare Wellness Visit. I appreciate your ongoing commitment to your health goals. Please review the following plan we discussed and let me know if I can assist you in the future.   Referrals/Orders/Follow-Ups/Clinician Recommendations: You are due for a yearly eye exam and also a colon screening.  Please discuss these things with your PCP during your next office visit.  It was nice talking with you today.  Aim for 30 minutes of exercise or brisk walking, 6-8 glasses of water, and 5 servings of fruits and vegetables each day.   This is a list of the screening recommended for you and due dates:  Health Maintenance  Topic Date Due   Complete foot exam   02/14/2023   Flu Shot  Never done   Eye exam for diabetics  07/04/2023   Zoster (Shingles) Vaccine (1 of 2) 09/25/2023*   Colon Cancer Screening  06/24/2024*   Hemoglobin A1C  01/29/2024   Yearly kidney health urinalysis for diabetes  02/05/2024   Yearly kidney function blood test for diabetes  08/27/2024   Medicare Annual Wellness Visit  09/16/2024   Hepatitis C Screening  Completed   HIV Screening  Completed   HPV Vaccine  Aged Out   DTaP/Tdap/Td vaccine  Discontinued   COVID-19 Vaccine  Discontinued  *Topic was postponed. The date shown is not the original due date.    Advanced directives: (Copy Requested) Please bring a copy of your health care power of attorney and living will to the office to be added to your chart at your convenience.  Next Medicare Annual Wellness Visit scheduled for next year: No

## 2023-09-17 NOTE — Progress Notes (Signed)
Subjective:   [name] is a 57 y.o. male who presents for Medicare Annual/Subsequent preventive examination.  Visit Complete: Virtual I connected with  Amie Critchley on 09/17/23 by a audio enabled telemedicine application and verified that I am speaking with the correct person using two identifiers.  Patient Location: Home  Provider Location: Office/Clinic  I discussed the limitations of evaluation and management by telemedicine. The patient expressed understanding and agreed to proceed.  Vital Signs: Because this visit was a virtual/telehealth visit, some criteria may be missing or patient reported. Any vitals not documented were not able to be obtained and vitals that have been documented are patient reported.   Cardiac Risk Factors include: male gender;advanced age (>14men, >36 women);hypertension;obesity (BMI >30kg/m2);Other (see comment);diabetes mellitus, Risk factor comments: A-Fib, CHF, NSVT, OSA, Fatty Liver, Chronic renal disease     Objective:    Today's Vitals   09/17/23 0945  Weight: (!) 313 lb (142 kg)  Height: 5\' 9"  (1.753 m)   Body mass index is 46.22 kg/m.     09/17/2023    9:53 AM 09/15/2023   12:19 PM 12/23/2022   11:54 AM 08/26/2022    3:09 PM 01/16/2022   11:23 AM 01/13/2022   10:32 AM 05/19/2021   12:11 PM  Advanced Directives  Does Patient Have a Medical Advance Directive? No No No No No No No  Would patient like information on creating a medical advance directive?  No - Patient declined No - Patient declined No - Patient declined No - Patient declined Yes (MAU/Ambulatory/Procedural Areas - Information given) No - Patient declined    Current Medications (verified) Outpatient Encounter Medications as of 09/17/2023  Medication Sig   albuterol (VENTOLIN HFA) 108 (90 Base) MCG/ACT inhaler INHALE 1-2 PUFFS INTO THE LUNGS EVERY 4 (FOUR) HOURS AS NEEDED FOR SHORTNESS OF BREATH OR WHEEZING.   amiodarone (PACERONE) 200 MG tablet Take 0.5 tablets (100  mg total) by mouth daily. Patient taking once daily   atorvastatin (LIPITOR) 40 MG tablet Take 1 tablet (40 mg total) by mouth daily.   carvedilol (COREG) 6.25 MG tablet Take 1 tablet (6.25 mg total) by mouth 2 (two) times daily.   dapagliflozin propanediol (FARXIGA) 10 MG TABS tablet Take 1 tablet (10 mg total) by mouth daily.   losartan (COZAAR) 25 MG tablet Take 0.5 tablets (12.5 mg total) by mouth at bedtime.   methimazole (TAPAZOLE) 10 MG tablet Take 1 tablet (10 mg total) by mouth daily.   pantoprazole (PROTONIX) 40 MG tablet TAKE 1 TABLET BY MOUTH EVERY DAY   potassium chloride SA (KLOR-CON M) 20 MEQ tablet Take 2 tablets (40 mEq total) by mouth daily. (Patient taking differently: Take 20 mEq by mouth 2 (two) times daily.)   PRESCRIPTION MEDICATION Inhale into the lungs See admin instructions. Bipap, pressure 16/12 with 2L of O2 - use whenever sleeping   sildenafil (VIAGRA) 50 MG tablet Take 50 mg by mouth as needed for erectile dysfunction.   spironolactone (ALDACTONE) 25 MG tablet Take 1 tablet (25 mg total) by mouth at bedtime.   tirzepatide (MOUNJARO) 12.5 MG/0.5ML Pen Inject 12.5 mg into the skin once a week.   torsemide (DEMADEX) 20 MG tablet Take 2 tablets (40 mg total) by mouth every morning AND 1 tablet (20 mg total) every evening. Take 40 mg by mouth every other day alternating with 20 mg every other day.   XARELTO 20 MG TABS tablet Take 1 tablet (20 mg total) by mouth daily with  supper.   [DISCONTINUED] pravastatin (PRAVACHOL) 40 MG tablet Take 40 mg by mouth daily.   No facility-administered encounter medications on file as of 09/17/2023.    Allergies (verified) Ace inhibitors, Bidil [isosorb dinitrate-hydralazine], Digoxin and related, and Buspirone   History: Past Medical History:  Diagnosis Date   Alcohol abuse    Anxiety state, unspecified    Atrial fibrillation (HCC)    CHF (congestive heart failure) (HCC)    Chronic systolic heart failure (HCC)    Diabetes  mellitus, type II (HCC)    Edema    Gout    History of medication noncompliance    Migraine    Obesity, unspecified    Obstructive sleep apnea    Psychiatric disorder    Pulmonary embolism (HCC)    Shortness of breath    Past Surgical History:  Procedure Laterality Date   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   Procedure: BIV ICD INSERTION CRT-D;  Surgeon: Lanier Prude, MD;  [facility]VAS[facility] CV LAB;  Service: Cardiovascular;  Laterality: N/A;   CARDIAC CATHETERIZATION     CARDIAC CATHETERIZATION N/A 08/13/2015   Procedure: Right/Left Heart Cath and Coronary Angiography;  Surgeon: Laurey Morale, MD;  Location[facility] INVASIVE CV LAB;  Service: Cardiovascular;  Laterality: N/A;   PACEMAKER INSERTION     RIGHT HEART CATH N/A 09/15/2023   Procedure: RIGHT HEART CATH;  Surgeon: Laurey Morale, MD;  Location: [facility] INVASIVE CV LAB;  Service: Cardiovascular;  Laterality: N/A;   RIGHT/LEFT HEART CATH AND CORONARY ANGIOGRAPHY N/A 04/14/2017   Procedure: Right/Left Heart Cath and Coronary Angiography;  Surgeon: Laurey Morale, MD;  Location: [facility] INVASIVE CV LAB;  Service: Cardiovascular;  Laterality: N/A;   TESTICLE SURGERY     Family History  Problem Relation Age of Onset   Cancer Mother        brain tumor   Hypertension Mother    Diabetes Father        Deceased, 25   Heart disease Maternal Grandmother    Hypertension Other        Family History   Stroke Other        Family History   Diabetes Other        Family History   Diabetes Daughter    Social History   Socioeconomic History   Marital status: Divorced    Spouse name: Not on file   Number of children: 3   Years of education: Not on file   Highest education level: Not on file  Occupational History   Occupation: DISABLED  Tobacco Use   Smoking status: Some Days    Current packs/day: 0.00    Average packs/day: 0.5 packs/day for 30.0 years (15.0 ttl pk-yrs)    Types: Cigarettes    Start date: 05/15/1990     Last attempt to quit: 05/15/2020    Years since quitting: 3.3   Smokeless tobacco: Never   Tobacco comments:    vaping - nicotine-free products  Vaping Use   Vaping status: Never Used  Substance and Sexual Activity   Alcohol use: Not Currently    Alcohol/week: 0.0 standard drinks of alcohol   Drug use: No   Sexual activity: Not Currently  Other Topics Concern   Not on file  Social History Narrative   He smokes about a pack per day and he has been smoking since he was 57 years of age.  He drinks alcohol occasionally, but he denies any illicit drug abuse.  He is presently on disability.    Lives with wife in a 2 story home.  Has 2 children.   Previously worked in Office manager, last worked in 1998.   Highest level of education:  11th grade      Lives with his daughter's mother, for right now.  Recently divorced. (2024).        Social Determinants of Health   Financial Resource Strain: Medium Risk (09/17/2023)   Overall Financial Resource Strain (CARDIA)    Difficulty of Paying Living Expenses: Somewhat hard  Food Insecurity: Food Insecurity Present (09/17/2023)   Hunger Vital Sign    Worried About Running Out of Food in the Last Year: Sometimes true    Ran Out of Food in the Last Year: Sometimes true  Transportation Needs: No Transportation Needs (09/17/2023)   PRAPARE - Administrator, Civil Service (Medical): No    Lack of Transportation (Non-Medical): No  Physical Activity: Inactive (09/17/2023)   Exercise Vital Sign    Days of Exercise per Week: 0 days    Minutes of Exercise per Session: 0 min  Stress: Stress Concern Present (09/17/2023)   [employer] of Occupational Health - Occupational Stress Questionnaire    Feeling of Stress : Rather much  Social Connections: Socially Isolated (09/17/2023)   Social Connection and Isolation Panel [NHANES]    Frequency of Communication with Friends and Family: More than three times a week    Frequency of Social Gatherings  with Friends and Family: Never    Attends Religious Services: Never    Database administrator or Organizations: No    Attends Engineer, structural: Never    Marital Status: Divorced    Tobacco Counseling Ready to quit: Not Answered Counseling given: Not Answered Tobacco comments: vaping - nicotine-free products   Clinical Intake:  Pre-visit preparation completed: Yes  Pain : No/denies pain     BMI - recorded: 46.22 Nutritional Status: BMI > 30  Obese Nutritional Risks: None Diabetes: Yes CBG done?: No Did pt. bring in CBG monitor from home?: No  How often do you need to have someone help you when you read instructions, pamphlets, or other written materials from your doctor or pharmacy?: 1 - Never  Interpreter Needed?: No  Information entered by :: Shahiem Bedwell, RMA   Activities of Daily Living    09/17/2023    9:49 AM  In your present state of health, do you have any difficulty performing the following activities:  Hearing? 0  Vision? 0  Difficulty concentrating or making decisions? 0  Walking or climbing stairs? 0  Dressing or bathing? 0  Doing errands, shopping? 0  Preparing Food and eating ? N  Using the Toilet? N  In the past six months, have you accidently leaked urine? Y  Do you have problems with loss of bowel control? N  Managing your Medications? N  Managing your Finances? N  Housekeeping or managing your Housekeeping? N    Patient Care Team: Etta Grandchild, MD as PCP - General (Internal Medicine) Quintella Reichert, MD as PCP - Cardiology (Cardiology) Laurey Morale, MD as PCP - Advanced Heart Failure (Cardiology) Lanier Prude, MD as PCP - Electrophysiology (Cardiology) Wyline Mood Dorothe Pea, MD as Consulting Physician (Cardiology) Szabat, Vinnie Level, [facility] (Inactive) (Pharmacist)  Indicate any recent Medical Services you may have received from other than [facility] providers in the past year (date may be approximate).     Assessment:    This  is a routine wellness examination for Delores.  Hearing/Vision screen Hearing Screening - Comments:: Denies hearing difficulties   Vision Screening - Comments:: Wears eyeglasses   Goals Addressed   None   Depression Screen    09/17/2023    9:57 AM 06/22/2023    5:18 PM 02/05/2023    2:17 PM 08/26/2022    3:08 PM 05/08/2022    1:37 PM 01/23/2022    2:30 PM 11/12/2021    2:14 PM  PHQ 2/9 Scores  PHQ - 2 Score 1  0 1  0   PHQ- 9 Score 6           Information is confidential and restricted. Go to Review Flowsheets to unlock data.    Fall Risk    09/17/2023    9:53 AM 02/05/2023    2:17 PM 08/26/2022    3:09 PM 01/23/2022    2:30 PM 07/08/2021   11:27 AM  Fall Risk   Falls in the past year? 1 0 1 0 0  Number falls in past yr: 0 0 0 0 0  Injury with Fall? 0 0 1 0 0  Risk for fall due to :  No Fall Risks History of fall(s)    Follow up Falls evaluation completed;Falls prevention discussed Falls evaluation completed Falls evaluation completed      MEDICARE RISK AT HOME: Medicare Risk at Home Any stairs in or around the home?: Yes (14 stairs) If so, are there any without handrails?: Yes Home free of loose throw rugs in walkways, pet beds, electrical cords, etc?: Yes Adequate lighting in your home to reduce risk of falls?: Yes Life alert?: No Use of a cane, walker or w/c?: No Grab bars in the bathroom?: No Shower chair or bench in shower?: No Elevated toilet seat or a handicapped toilet?: No  TIMED UP AND GO:  Was the test performed?  No    Cognitive Function:        09/17/2023   10:03 AM 08/26/2022    3:11 PM  6CIT Screen  What Year? 0 points 0 points  What month? 0 points 0 points  What time? 0 points 0 points  Count back from 20 0 points 0 points  Months in reverse 0 points 0 points  Repeat phrase 0 points 8 points  Total Score 0 points 8 points    Immunizations Immunization History  Administered Date(s) Administered   19-influenza Whole 10/20/2011   Hep  A / Hep B 05/25/2018   PNEUMOCOCCAL CONJUGATE-20 02/13/2022   Pneumococcal Polysaccharide-23 08/11/2014   Tdap 10/20/2011    TDAP status: Due, Education has been provided regarding the importance of this vaccine. Advised may receive this vaccine at local pharmacy or Health Dept. Aware to provide a copy of the vaccination record if obtained from local pharmacy or Health Dept. Verbalized acceptance and understanding.  Flu Vaccine status: Declined, Education has been provided regarding the importance of this vaccine but patient still declined. Advised may receive this vaccine at local pharmacy or Health Dept. Aware to provide a copy of the vaccination record if obtained from local pharmacy or Health Dept. Verbalized acceptance and understanding.  Pneumococcal vaccine status: Due, Education has been provided regarding the importance of this vaccine. Advised may receive this vaccine at local pharmacy or Health Dept. Aware to provide a copy of the vaccination record if obtained from local pharmacy or Health Dept. Verbalized acceptance and understanding.  Covid-19 vaccine status: Declined, Education has been provided regarding the importance  of this vaccine but patient still declined. Advised may receive this vaccine at local pharmacy or Health Dept.or vaccine clinic. Aware to provide a copy of the vaccination record if obtained from local pharmacy or Health Dept. Verbalized acceptance and understanding.  Qualifies for Shingles Vaccine? Yes   Zostavax completed No   Shingrix Completed?: No.    Education has been provided regarding the importance of this vaccine. Patient has been advised to call insurance company to determine out of pocket expense if they have not yet received this vaccine. Advised may also receive vaccine at local pharmacy or Health Dept. Verbalized acceptance and understanding.  Screening Tests Health Maintenance  Topic Date Due   FOOT EXAM  02/14/2023   INFLUENZA VACCINE  Never done    OPHTHALMOLOGY EXAM  07/04/2023   Zoster Vaccines- Shingrix (1 of 2) 09/25/2023 (Originally 09/27/1987)   Colonoscopy  06/24/2024 (Originally 09/26/2013)   HEMOGLOBIN A1C  01/29/2024   Diabetic kidney evaluation - Urine ACR  02/05/2024   Diabetic kidney evaluation - eGFR measurement  08/27/2024   Medicare Annual Wellness (AWV)  09/16/2024   Hepatitis C Screening  Completed   HIV Screening  Completed   HPV VACCINES  Aged Out   DTaP/Tdap/Td  Discontinued   COVID-19 Vaccine  Discontinued    Health Maintenance  Health Maintenance Due  Topic Date Due   FOOT EXAM  02/14/2023   INFLUENZA VACCINE  Never done   OPHTHALMOLOGY EXAM  07/04/2023    Lung Cancer Screening: (Low Dose CT Chest recommended if Age 12-80 years, 20 pack-year currently smoking OR have quit w/in 15years.) does not qualify.   Lung Cancer Screening Referral: N/A  Additional Screening:  Hepatitis C Screening: does qualify; Completed 04/22/2018  Vision Screening: Recommended annual ophthalmology exams for early detection of glaucoma and other disorders of the eye. Is the patient up to date with their annual eye exam?  No  Who is the provider or what is the name of the office in which the patient attends annual eye exams? N/A If pt is not established with a provider, would they like to be referred to a provider to establish care? Yes .   Dental Screening: Recommended annual dental exams for proper oral hygiene  Diabetic Foot Exam: Diabetic Foot Exam: Completed 02/06/2023  Community Resource Referral / Chronic Care Management: CRR required this visit?  No   CCM required this visit?  No     Plan:     I have personally reviewed and noted the following in the patient's chart:   Medical and social history Use of alcohol, tobacco or illicit drugs  Current medications and supplements including opioid prescriptions. Patient is not currently taking opioid prescriptions. Functional ability and status Nutritional  status Physical activity Advanced directives List of other physicians Hospitalizations, surgeries, and ER visits in previous 12 months Vitals Screenings to include cognitive, depression, and falls Referrals and appointments  In addition, I have reviewed and discussed with patient certain preventive protocols, quality metrics, and best practice recommendations. A written personalized care plan for preventive services as well as general preventive health recommendations were provided to patient.     Dimitrius Steedman L Rodnisha Blomgren, CMA   09/17/2023   After Visit Summary: (MyChart) Due to this being a telephonic visit, the after visit summary with patients personalized plan was offered to patient via MyChart   Nurse Notes: Patient is due for a eye examination.  He can not remember the name of eye care center and exactly when he  went.  Patient needs a referral for eye care.  Patient declines the Flu vaccine as well as the Shingrix vaccines. He also states that he has to do a colo guard verses a colonoscopy due to heat conditions.  Patient will discuss these things during his up coming office visit with Dr. Yetta Barre next week.  He will wait to schedule next year's AWVs.

## 2023-09-17 NOTE — Telephone Encounter (Signed)
Scheduled appointments per referral. Patient is aware of the appointment time and date as well as the address. Patient was informed to arrive 10-15 minutes prior with updated insurance information. All questions were answered.
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
## 2023-09-22 ENCOUNTER — Encounter: Payer: Self-pay | Admitting: Internal Medicine

## 2023-09-22 ENCOUNTER — Ambulatory Visit: Payer: Medicare HMO | Admitting: Internal Medicine

## 2023-09-22 ENCOUNTER — Ambulatory Visit: Payer: Medicare HMO | Attending: Cardiology

## 2023-09-22 ENCOUNTER — Telehealth (HOSPITAL_COMMUNITY): Payer: Self-pay | Admitting: *Deleted

## 2023-09-22 VITALS — BP 106/76 | HR 74 | Temp 97.9°F | Ht 69.0 in | Wt 315.2 lb

## 2023-09-22 DIAGNOSIS — E118 Type 2 diabetes mellitus with unspecified complications: Secondary | ICD-10-CM

## 2023-09-22 DIAGNOSIS — R7989 Other specified abnormal findings of blood chemistry: Secondary | ICD-10-CM

## 2023-09-22 DIAGNOSIS — G5712 Meralgia paresthetica, left lower limb: Secondary | ICD-10-CM

## 2023-09-22 DIAGNOSIS — I10 Essential (primary) hypertension: Secondary | ICD-10-CM

## 2023-09-22 DIAGNOSIS — E611 Iron deficiency: Secondary | ICD-10-CM | POA: Diagnosis not present

## 2023-09-22 DIAGNOSIS — Z95810 Presence of automatic (implantable) cardiac defibrillator: Secondary | ICD-10-CM

## 2023-09-22 DIAGNOSIS — I5042 Chronic combined systolic (congestive) and diastolic (congestive) heart failure: Secondary | ICD-10-CM

## 2023-09-22 NOTE — Progress Notes (Signed)
Subjective:  Patient ID: [name], male    DOB: 12-18-1965  Age: 57 y.o. MRN: [id number]  CC: Congestive Heart Failure   HPI [name] presents for f/up ----  Discussed the use of AI scribe software for clinical note transcription with the patient, who gave verbal consent to proceed.  History of Present Illness   The patient, with a history of cardiac disease, is scheduled to see a new heart specialist and has multiple upcoming appointments related to a planned surgery. They report a recent return of a pins and needles sensation in the left thigh, described as numbness with a burning feeling. This symptom had previously occurred during a hospitalization years ago.  Additionally, the patient experiences burping reminiscent of boiled eggs, particularly after eating. They mention a recent increase in their Mounjaro dose by the cardiology team. They deny any dysphagia or odynophagia but report trouble sleeping and describe it as "broken sleep."  The patient also mentions a recent life event, a divorce, which was finalized a few months ago. They had their last eye exam approximately one to two years ago and currently wear glasses. They deny any pain or swelling in their feet and regularly have their toenails trimmed.       Outpatient Medications Prior to Visit  Medication Sig Dispense Refill   albuterol (VENTOLIN HFA) 108 (90 Base) MCG/ACT inhaler INHALE 1-2 PUFFS INTO THE LUNGS EVERY 4 (FOUR) HOURS AS NEEDED FOR SHORTNESS OF BREATH OR WHEEZING. 8.5 each 1   amiodarone (PACERONE) 200 MG tablet Take 0.5 tablets (100 mg total) by mouth daily. Patient taking once daily 90 tablet 1   atorvastatin (LIPITOR) 40 MG tablet Take 1 tablet (40 mg total) by mouth daily. 90 tablet 3   carvedilol (COREG) 6.25 MG tablet Take 1 tablet (6.25 mg total) by mouth 2 (two) times daily. 60 tablet 11   dapagliflozin propanediol (FARXIGA) 10 MG TABS tablet Take 1 tablet (10 mg total) by mouth daily.  90 tablet 1   losartan (COZAAR) 25 MG tablet Take 0.5 tablets (12.5 mg total) by mouth at bedtime. 15 tablet 3   methimazole (TAPAZOLE) 10 MG tablet Take 1 tablet (10 mg total) by mouth daily. 90 tablet 3   pantoprazole (PROTONIX) 40 MG tablet TAKE 1 TABLET BY MOUTH EVERY DAY 90 tablet 0   potassium chloride SA (KLOR-CON M) 20 MEQ tablet Take 2 tablets (40 mEq total) by mouth daily. (Patient taking differently: Take 20 mEq by mouth 2 (two) times daily.) 180 tablet 3   PRESCRIPTION MEDICATION Inhale into the lungs See admin instructions. Bipap, pressure 16/12 with 2L of O2 - use whenever sleeping     sildenafil (VIAGRA) 50 MG tablet Take 50 mg by mouth as needed for erectile dysfunction.     spironolactone (ALDACTONE) 25 MG tablet Take 1 tablet (25 mg total) by mouth at bedtime. 90 tablet 3   tirzepatide (MOUNJARO) 12.5 MG/0.5ML Pen Inject 12.5 mg into the skin once a week. 2 mL 0   torsemide (DEMADEX) 20 MG tablet Take 2 tablets (40 mg total) by mouth every morning AND 1 tablet (20 mg total) every evening. Take 40 mg by mouth every other day alternating with 20 mg every other day. 60 tablet 6   XARELTO 20 MG TABS tablet Take 1 tablet (20 mg total) by mouth daily with supper. 90 tablet 1   No facility-administered medications prior to visit.    ROS Review of Systems  Constitutional:  Positive for fatigue. Negative for appetite change, chills, diaphoresis and unexpected weight change.  HENT: Negative.    Respiratory:  Positive for shortness of breath. Negative for chest tightness and wheezing.   Cardiovascular:  Negative for chest pain, palpitations and leg swelling.  Gastrointestinal:  Negative for abdominal pain, diarrhea, nausea and vomiting.  Genitourinary: Negative.  Negative for difficulty urinating.  Musculoskeletal: Negative.  Negative for myalgias.  Skin: Negative.   Neurological: Negative.  Negative for dizziness.  Hematological:  Negative for adenopathy. Does not bruise/bleed  easily.  Psychiatric/Behavioral:  Positive for dysphoric mood and sleep disturbance. Negative for suicidal ideas. The patient is nervous/anxious.     Objective:  BP 106/76 (BP Location: Left Arm, Patient Position: Sitting, Cuff Size: Large)   Pulse 74   Temp 97.9 F (36.6 C) (Oral)   Ht 5\' 9"  (1.753 m)   Wt (!) 315 lb 3.2 oz (143 kg)   SpO2 96%   BMI 46.55 kg/m   BP Readings from Last 3 Encounters:  09/22/23 106/76  09/15/23 105/76  08/28/23 102/68    Wt Readings from Last 3 Encounters:  09/22/23 (!) 315 lb 3.2 oz (143 kg)  09/17/23 (!) 313 lb (142 kg)  09/15/23 (!) 313 lb (142 kg)    Physical Exam Vitals reviewed.  Constitutional:      General: He is not in acute distress.    Appearance: He is ill-appearing. He is not toxic-appearing or diaphoretic.  HENT:     Mouth/Throat:     Mouth: Mucous membranes are moist.  Eyes:     General: No scleral icterus.    Conjunctiva/sclera: Conjunctivae normal.  Cardiovascular:     Rate and Rhythm: Normal rate and regular rhythm.     Heart sounds: No murmur heard. Pulmonary:     Effort: Pulmonary effort is normal.     Breath sounds: No stridor. No wheezing, rhonchi or rales.  Abdominal:     General: Abdomen is protuberant. Bowel sounds are normal. There is no distension.     Palpations: Abdomen is soft. There is no hepatomegaly, splenomegaly or mass.     Tenderness: There is no abdominal tenderness.  Musculoskeletal:        General: Normal range of motion.     Cervical back: Neck supple.     Right lower leg: No edema.     Left lower leg: No edema.  Lymphadenopathy:     Cervical: No cervical adenopathy.  Skin:    General: Skin is warm and dry.  Neurological:     General: No focal deficit present.  Psychiatric:        Mood and Affect: Mood normal.        Behavior: Behavior normal.     Lab Results  Component Value Date   WBC 9.2 08/28/2023   HGB 15.6 09/15/2023   HGB 16.0 09/15/2023   HCT 46.0 09/15/2023   HCT 47.0  09/15/2023   PLT 252 08/28/2023   GLUCOSE 77 08/28/2023   CHOL 132 02/05/2023   TRIG 162.0 (H) 02/05/2023   HDL 34.50 (L) 02/05/2023   LDLDIRECT 114.0 03/21/2020   LDLCALC 65 02/05/2023   ALT 20 08/28/2023   AST 18 08/28/2023   NA 138 09/15/2023   NA 138 09/15/2023   K 3.5 09/15/2023   K 3.6 09/15/2023   CL 103 08/28/2023   CREATININE 1.39 (H) 08/28/2023   BUN 15 08/28/2023   CO2 23 08/28/2023   TSH <0.010 (L) 07/30/2023  PSA 0.51 02/05/2023   INR 1.5 (H) 09/25/2022   HGBA1C 5.4 07/30/2023   MICROALBUR 1.2 02/05/2023    CARDIAC CATHETERIZATION  Result Date: 09/15/2023 1. Elevated filling pressures. 2. Mild pulmonary venous hypertension. 3. Low cardiac output.  4. Preserved PAPi Increase torsemide to 40 qam/20 qpm.  Will start LVAD workup.    Assessment & Plan:   Low TSH level- He has subclinical hyperthyroidism.  Essential hypertension- His blood pressure is very well-controlled.  Iron deficiency - Recent H/H were normal.  Meralgia paresthetica, left- I have asked him to change positions frequently and to do some stretching maneuvers.  Type II diabetes mellitus with manifestations (HCC)- His blood sugar is well-controlled. -     HM Diabetes Foot Exam -     Ambulatory referral to Ophthalmology     Follow-up: No follow-ups on file.  Sanda Linger, MD

## 2023-09-22 NOTE — Telephone Encounter (Addendum)
  ADVANCED [facility] FAILURE [facility]   Pre-operative Risk Assessment    Request for Surgical Clearance    Procedure:   Cleaning (simple or deep), filling/crowns/bridges  Date of Surgery:  Clearance TBD                                 Surgeon:   Surgeon's Group or Practice Name:  [facility] Phone number:  [phone] Fax number:  [phone]   Type of Clearance Requested:   - Medical    Type of Anesthesia:  Local with epinephrine   Additional requests/questions:  Please fax a copy of clearance to the surgeon's office.  Sent to Dr Shirlee Latch for recommendations/clearance  Signed, Meredith Staggers, RN 09/22/2023, 11:50 AM

## 2023-09-22 NOTE — Telephone Encounter (Signed)
Clearance faxed

## 2023-09-23 ENCOUNTER — Other Ambulatory Visit (HOSPITAL_COMMUNITY): Payer: Self-pay | Admitting: *Deleted

## 2023-09-23 ENCOUNTER — Ambulatory Visit (HOSPITAL_COMMUNITY): Payer: Medicare HMO | Attending: Internal Medicine

## 2023-09-23 DIAGNOSIS — I5042 Chronic combined systolic (congestive) and diastolic (congestive) heart failure: Secondary | ICD-10-CM | POA: Diagnosis not present

## 2023-09-23 DIAGNOSIS — D6851 Activated protein C resistance: Secondary | ICD-10-CM | POA: Diagnosis not present

## 2023-09-23 DIAGNOSIS — I5022 Chronic systolic (congestive) heart failure: Secondary | ICD-10-CM

## 2023-09-23 DIAGNOSIS — E669 Obesity, unspecified: Secondary | ICD-10-CM | POA: Diagnosis not present

## 2023-09-23 NOTE — Progress Notes (Unsigned)
EPIC Encounter for ICM Monitoring  Patient Name: [name] is a 57 y.o. male Date: 09/23/2023 Primary Care Physican: Adam Grandchild, MD Primary Cardiologist: [name] Electrophysiologist: [name] Pacing: 95% 02/04/2023 Weight:  358 lbs       06/01/2023 Weight: 328 lbs (losing weight taking Mounjaro)   06/09/2023 Weight: 328 lbs             07/07/2023 Weight: 308 lbs   9/10/202 Weight: 308 lbs      09/16/2023 Weight: 317 lbs      Spoke with patient and heart failure questions reviewed.  Transmission results reviewed.  Pt denies dehydration or fluid symptoms.  Reports feeling well at this time and voices no complaints.     CorVue thoracic impedance suggesting possible dryness starting 10/14.   Prescribed:  Torsemide 20 mg Take 2 tablet(s) (40 mg total) every morning and 1 tablet (20 mg total) every afternoon.  09/23/2023 Pt reports he is only taking Torsemide 40 mg in the morning and no afternoon dose.  Potassium 20 mEq take 2 tablet(s) (40 mEq total) by mouth daily. Spironolactone 25 mg take 1 tablet daily   Labs: 09/15/2023 Potassium 3.6, Sodium 138 08/28/2023 Creatinine 1.39, BUN 15, Potassium 3.6, Sodium 136, GFR >60 07/30/2023 Creatinine 1.62, BUN 24, Potassium 3.2, Sodium 136, GFR 50 06/16/2023 Creatinine 1.10, BUN 13, Potassium 3.5, Sodium 136, GFR >60 02/06/2023 Creatinine 1.86, BUN 24, Potassium 4.2, Sodium 138, GFR 40.56 01/09/2023 Creatinine 1.68, BUN 18, Potassium 3.2, Sodium 139, GFR 48  12/23/2022 Creatinine 1.53, BUN 15, Potassium 3.9, Sodium 137, GFR 54  A complete set of results can be found in Results Review.   Recommendations:  Advised to increase fluid intake to stay hydrated.   Copy sent to Dr [name] for review.      Follow-up plan: ICM clinic phone appointment 09/29/2023 to recheck fluid levels.   91 day device clinic remote transmission 10/27/2023.     EP/Cardiology Office Visits:   10/07/2023 with HF clinic.  Recall 08/09/2023 with Adam Dowse, PA   Copy of  ICM check sent to Dr. Lalla [name].     3 month ICM trend: 09/21/2023.    12-14 Month ICM trend:     Adam Soda, RN 09/23/2023 8:48 AM

## 2023-09-23 NOTE — Progress Notes (Signed)
No changes for now but have him get a BMET this week please.

## 2023-09-24 ENCOUNTER — Telehealth (INDEPENDENT_AMBULATORY_CARE_PROVIDER_SITE_OTHER): Payer: Medicare HMO | Admitting: Psychiatry

## 2023-09-24 ENCOUNTER — Encounter: Payer: Self-pay | Admitting: Psychiatry

## 2023-09-24 DIAGNOSIS — F3132 Bipolar disorder, current episode depressed, moderate: Secondary | ICD-10-CM

## 2023-09-24 DIAGNOSIS — F4310 Post-traumatic stress disorder, unspecified: Secondary | ICD-10-CM

## 2023-09-24 DIAGNOSIS — F410 Panic disorder [episodic paroxysmal anxiety] without agoraphobia: Secondary | ICD-10-CM | POA: Diagnosis not present

## 2023-09-24 DIAGNOSIS — F1010 Alcohol abuse, uncomplicated: Secondary | ICD-10-CM

## 2023-09-24 DIAGNOSIS — G4700 Insomnia, unspecified: Secondary | ICD-10-CM

## 2023-09-24 DIAGNOSIS — G4733 Obstructive sleep apnea (adult) (pediatric): Secondary | ICD-10-CM | POA: Diagnosis not present
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
## 2023-09-24 NOTE — Patient Instructions (Signed)
Start melatonin 6 mg, two hours before going to bed Next appointment- 1/3 at 8 am Referral to therapy

## 2023-09-24 NOTE — Progress Notes (Signed)
Spoke with patient and advised Dr Shirlee Latch recommended to have labs done today or tomorrow.   Patient said he is out of town and will have them drawn on 10/29.  Lab appointment for 10/29 at [facility].   Pt is scheduled for test on 10/29 as well.     Advised will inform Dr Shirlee Latch that labs will be drawn next week due to patient is out of town today through Monday.

## 2023-09-26 DIAGNOSIS — R110 Nausea: Secondary | ICD-10-CM | POA: Diagnosis not present

## 2023-09-26 DIAGNOSIS — R1084 Generalized abdominal pain: Secondary | ICD-10-CM | POA: Diagnosis not present

## 2023-09-26 DIAGNOSIS — I4891 Unspecified atrial fibrillation: Secondary | ICD-10-CM | POA: Diagnosis not present

## 2023-09-26 DIAGNOSIS — I429 Cardiomyopathy, unspecified: Secondary | ICD-10-CM | POA: Diagnosis not present

## 2023-09-26 DIAGNOSIS — I10 Essential (primary) hypertension: Secondary | ICD-10-CM | POA: Diagnosis not present

## 2023-09-26 DIAGNOSIS — I110 Hypertensive heart disease with heart failure: Secondary | ICD-10-CM | POA: Diagnosis not present

## 2023-09-26 DIAGNOSIS — T50901A Poisoning by unspecified drugs, medicaments and biological substances, accidental (unintentional), initial encounter: Secondary | ICD-10-CM | POA: Diagnosis not present

## 2023-09-26 DIAGNOSIS — T50995A Adverse effect of other drugs, medicaments and biological substances, initial encounter: Secondary | ICD-10-CM | POA: Diagnosis not present

## 2023-09-26 DIAGNOSIS — N390 Urinary tract infection, site not specified: Secondary | ICD-10-CM | POA: Diagnosis not present

## 2023-09-26 DIAGNOSIS — Z7901 Long term (current) use of anticoagulants: Secondary | ICD-10-CM | POA: Diagnosis not present

## 2023-09-26 DIAGNOSIS — M549 Dorsalgia, unspecified: Secondary | ICD-10-CM | POA: Diagnosis not present

## 2023-09-26 DIAGNOSIS — R109 Unspecified abdominal pain: Secondary | ICD-10-CM | POA: Diagnosis not present

## 2023-09-26 DIAGNOSIS — I509 Heart failure, unspecified: Secondary | ICD-10-CM | POA: Diagnosis not present

## 2023-09-26 DIAGNOSIS — R142 Eructation: Secondary | ICD-10-CM | POA: Diagnosis not present

## 2023-09-26 DIAGNOSIS — Z95810 Presence of automatic (implantable) cardiac defibrillator: Secondary | ICD-10-CM | POA: Diagnosis not present

## 2023-09-26 DIAGNOSIS — R1013 Epigastric pain: Secondary | ICD-10-CM | POA: Diagnosis not present

## 2023-09-26 DIAGNOSIS — I5022 Chronic systolic (congestive) heart failure: Secondary | ICD-10-CM | POA: Diagnosis not present

## 2023-09-26 DIAGNOSIS — R1033 Periumbilical pain: Secondary | ICD-10-CM | POA: Diagnosis not present

## 2023-09-26 DIAGNOSIS — R140 Abdominal distension (gaseous): Secondary | ICD-10-CM | POA: Diagnosis not present

## 2023-09-28 ENCOUNTER — Telehealth (HOSPITAL_COMMUNITY): Payer: Self-pay | Admitting: Cardiology

## 2023-09-28 ENCOUNTER — Encounter (HOSPITAL_COMMUNITY): Payer: Medicare HMO

## 2023-09-28 ENCOUNTER — Ambulatory Visit: Payer: Medicare HMO | Admitting: Psychiatry

## 2023-09-28 ENCOUNTER — Other Ambulatory Visit (HOSPITAL_COMMUNITY): Payer: Self-pay | Admitting: Unknown Physician Specialty

## 2023-09-28 DIAGNOSIS — I5042 Chronic combined systolic (congestive) and diastolic (congestive) heart failure: Secondary | ICD-10-CM

## 2023-09-28 NOTE — Progress Notes (Signed)
Repeat labs done 10/28

## 2023-09-28 NOTE — Telephone Encounter (Signed)
Opened in error

## 2023-09-29 ENCOUNTER — Ambulatory Visit (HOSPITAL_COMMUNITY)
Admission: RE | Admit: 2023-09-29 | Discharge: 2023-09-29 | Disposition: A | Discharge status: Home / Self Care | Payer: Medicare HMO | Source: Ambulatory Visit | Attending: Cardiology | Admitting: Cardiology

## 2023-09-29 ENCOUNTER — Ambulatory Visit (INDEPENDENT_AMBULATORY_CARE_PROVIDER_SITE_OTHER): Payer: Medicare HMO

## 2023-09-29 DIAGNOSIS — I5042 Chronic combined systolic (congestive) and diastolic (congestive) heart failure: Secondary | ICD-10-CM

## 2023-09-29 DIAGNOSIS — Z01818 Encounter for other preprocedural examination: Secondary | ICD-10-CM | POA: Diagnosis not present

## 2023-09-29 DIAGNOSIS — Z9989 Dependence on other enabling machines and devices: Secondary | ICD-10-CM | POA: Diagnosis not present

## 2023-09-29 DIAGNOSIS — Z95810 Presence of automatic (implantable) cardiac defibrillator: Secondary | ICD-10-CM

## 2023-09-29 DIAGNOSIS — Z01810 Encounter for preprocedural cardiovascular examination: Secondary | ICD-10-CM | POA: Diagnosis present

## 2023-09-29 DIAGNOSIS — Z01812 Encounter for preprocedural laboratory examination: Secondary | ICD-10-CM | POA: Diagnosis present

## 2023-09-29 LAB — LIPID PANEL
Cholesterol: 122 mg/dL (ref 0–200)
HDL: 36 mg/dL — ABNORMAL LOW (ref >40)
LDL Cholesterol: 57 mg/dL (ref 0–99)
Total CHOL/HDL Ratio: 3.4 {ratio}
Triglycerides: 146 mg/dL (ref <150)
VLDL: 29 mg/dL (ref 0–40)

## 2023-09-29 LAB — PULMONARY FUNCTION TEST
DL/VA % pred: 71 %
DL/VA: 3.1 ml/min/mmHg/L
DLCO unc % pred: 69 %
DLCO unc: 19.11 ml/min/mmHg
FEF 25-75 Post: 3.74 L/s
FEF 25-75 Pre: 3.55 L/s
FEF2575-%Change-Post: 5 %
FEF2575-%Pred-Post: 119 %
FEF2575-%Pred-Pre: 113 %
FEV1-%Change-Post: 2 %
FEV1-%Pred-Post: 99 %
FEV1-%Pred-Pre: 96 %
FEV1-Post: 3.62 L
FEV1-Pre: 3.52 L
FEV1FVC-%Change-Post: 2 %
FEV1FVC-%Pred-Pre: 103 %
FEV6-%Change-Post: 0 %
FEV6-%Pred-Post: 95 %
FEV6-%Pred-Pre: 94 %
FEV6-Post: 4.35 L
FEV6-Pre: 4.32 L
FEV6FVC-%Change-Post: 0 %
FEV6FVC-%Pred-Post: 104 %
FEV6FVC-%Pred-Pre: 104 %
FVC-%Change-Post: 0 %
FVC-%Pred-Post: 92 %
FVC-%Pred-Pre: 92 %
FVC-Post: 4.42 L
FVC-Pre: 4.42 L
Post FEV1/FVC ratio: 82 %
Post FEV6/FVC ratio: 100 %
Pre FEV1/FVC ratio: 80 %
Pre FEV6/FVC Ratio: 100 %
RV % pred: 81 %
RV: 1.7 L
TLC % pred: 92 %
TLC: 6.27 L

## 2023-09-29 LAB — CBC WITH DIFFERENTIAL/PLATELET
Abs Immature Granulocytes: 0.02 10*3/uL (ref 0.00–0.07)
Basophils Absolute: 0.1 10*3/uL (ref 0.0–0.1)
Basophils Relative: 1 %
Eosinophils Absolute: 0.9 10*3/uL — ABNORMAL HIGH (ref 0.0–0.5)
Eosinophils Relative: 10 %
HCT: 44.1 % (ref 39.0–52.0)
Hemoglobin: 15.8 g/dL (ref 13.0–17.0)
Immature Granulocytes: 0 %
Lymphocytes Relative: 38 %
Lymphs Abs: 3.3 10*3/uL (ref 0.7–4.0)
MCH: 29.4 pg (ref 26.0–34.0)
MCHC: 35.8 g/dL (ref 30.0–36.0)
MCV: 82.1 fL (ref 80.0–100.0)
Monocytes Absolute: 0.6 10*3/uL (ref 0.1–1.0)
Monocytes Relative: 7 %
Neutro Abs: 3.8 10*3/uL (ref 1.7–7.7)
Neutrophils Relative %: 44 %
Platelets: 255 10*3/uL (ref 150–400)
RBC: 5.37 MIL/uL (ref 4.22–5.81)
RDW: 14.6 % (ref 11.5–15.5)
WBC: 8.6 10*3/uL (ref 4.0–10.5)
nRBC: 0 % (ref 0.0–0.2)

## 2023-09-29 LAB — COMPREHENSIVE METABOLIC PANEL
ALT: 16 U/L (ref 0–44)
AST: 15 U/L (ref 15–41)
Albumin: 3.1 g/dL — ABNORMAL LOW (ref 3.5–5.0)
Alkaline Phosphatase: 109 U/L (ref 38–126)
Anion gap: 7 (ref 5–15)
BUN: 16 mg/dL (ref 6–20)
CO2: 24 mmol/L (ref 22–32)
Calcium: 8.8 mg/dL — ABNORMAL LOW (ref 8.9–10.3)
Chloride: 108 mmol/L (ref 98–111)
Creatinine, Ser: 1.44 mg/dL — ABNORMAL HIGH (ref 0.61–1.24)
GFR, Estimated: 57 mL/min — ABNORMAL LOW (ref >60)
Glucose, Bld: 95 mg/dL (ref 70–99)
Potassium: 3.6 mmol/L (ref 3.5–5.1)
Sodium: 139 mmol/L (ref 135–145)
Total Bilirubin: 0.5 mg/dL (ref 0.3–1.2)
Total Protein: 7.2 g/dL (ref 6.5–8.1)

## 2023-09-29 LAB — HIV ANTIBODY (ROUTINE TESTING W REFLEX): HIV Screen 4th Generation wRfx: NONREACTIVE

## 2023-09-29 LAB — PROTIME-INR
INR: 1.5 — ABNORMAL HIGH (ref 0.8–1.2)
Prothrombin Time: 18.1 s — ABNORMAL HIGH (ref 11.4–15.2)

## 2023-09-29 LAB — VAS US DOPPLER PRE VAD
Left ABI: 1.1
Right ABI: 1.21

## 2023-09-29 LAB — HEPATITIS B CORE ANTIBODY, TOTAL: Hep B Core Total Ab: NONREACTIVE

## 2023-09-29 LAB — URIC ACID: Uric Acid, Serum: 8.8 mg/dL — ABNORMAL HIGH (ref 3.7–8.6)

## 2023-09-29 LAB — PREALBUMIN: Prealbumin: 23 mg/dL (ref 18–38)

## 2023-09-29 LAB — APTT: aPTT: 28 s (ref 24–36)

## 2023-09-29 LAB — HEPATITIS B SURFACE ANTIGEN: Hepatitis B Surface Ag: NONREACTIVE

## 2023-09-29 LAB — HEPATITIS C ANTIBODY: HCV Ab: NONREACTIVE

## 2023-09-29 LAB — PSA: Prostatic Specific Antigen: 0.59 ng/mL (ref 0.00–4.00)

## 2023-09-29 LAB — LACTATE DEHYDROGENASE: LDH: 144 U/L (ref 98–192)

## 2023-09-29 LAB — HEPATITIS B SURFACE ANTIBODY,QUALITATIVE: Hep B S Ab: NONREACTIVE

## 2023-09-29 MED ORDER — ALBUTEROL SULFATE (2.5 MG/3ML) 0.083% IN NEBU
2.5000 mg | INHALATION_SOLUTION | Freq: Once | RESPIRATORY_TRACT | Status: AC
  Administered 2023-09-29: 2.5 mg via RESPIRATORY_TRACT

## 2023-09-29 NOTE — Progress Notes (Signed)
Lower extremity venous duplex completed. Please see CV Procedures for preliminary results.  Shona Simpson, RVT 09/29/23 11:05 AM

## 2023-09-29 NOTE — Progress Notes (Signed)
Pre-VAD completed. Please see CV Procedures for preliminary results.  Shona Simpson, RVT 09/29/23 11:28 AM

## 2023-09-29 NOTE — Progress Notes (Unsigned)
EPIC Encounter for ICM Monitoring  Patient Name: [name] is a 57 y.o. male Date: 09/29/2023 Primary Care Physican: Etta Grandchild, MD Primary Cardiologist: Shirlee Latch Electrophysiologist: Townsend Roger Pacing: 95% 02/04/2023 Weight:  358 lbs       06/01/2023 Weight: 328 lbs (losing weight taking Mounjaro)   06/09/2023 Weight: 328 lbs             07/07/2023 Weight: 308 lbs   9/10/202 Weight: 308 lbs      09/16/2023 Weight: 317 lbs      Spoke with patient and heart failure questions reviewed.  Transmission results reviewed.  Pt asymptomatic for fluid accumulation.  He missed several doses of Torsemide due to traveling over the weekend but resume his morning dosage on 10/27 with very good urine output.  Pt is having work up for LVAD.      CorVue thoracic impedance suggesting fluid levels changed from dryness from 10/14-10/25 to possible fluid accumulation starting 10/27.  Difficulty balancing fluid levels.   Prescribed:  Torsemide 20 mg Take 2 tablet(s) (40 mg total) every morning and 1 tablet (20 mg total) every afternoon.  09/23/2023 Pt reports he is only taking Torsemide 40 mg in the morning and no afternoon dose.  Potassium 20 mEq take 2 tablet(s) (40 mEq total) by mouth daily. Spironolactone 25 mg take 1 tablet daily   Labs: 09/29/2023 Creatinine 1.44, BUN 16, Potassium 3.6, Sodium 139, GFR 57 09/15/2023 Potassium 3.6, Sodium 138 08/28/2023 Creatinine 1.39, BUN 15, Potassium 3.6, Sodium 136, GFR >60 07/30/2023 Creatinine 1.62, BUN 24, Potassium 3.2, Sodium 136, GFR 50 06/16/2023 Creatinine 1.10, BUN 13, Potassium 3.5, Sodium 136, GFR >60 02/06/2023 Creatinine 1.86, BUN 24, Potassium 4.2, Sodium 138, GFR 40.56 01/09/2023 Creatinine 1.68, BUN 18, Potassium 3.2, Sodium 139, GFR 48  12/23/2022 Creatinine 1.53, BUN 15, Potassium 3.9, Sodium 137, GFR 54  A complete set of results can be found in Results Review.   Recommendations:  10/30 report shows fluid levels trending back toward  baseline.  Advised to take Torsemide morning dosage and afternoon dosage x 2 days.  He normally only takes morning dosage.     Follow-up plan: ICM clinic phone appointment 10/06/2023 to recheck fluid levels.   91 day device clinic remote transmission 10/27/2023.     EP/Cardiology Office Visits:   10/07/2023 with HF clinic canceled.  Recall 08/09/2023 with Francis Dowse, PA   Copy of ICM check sent to Dr. Lalla Brothers.  09/30/2023 ICM Trend           3 month ICM trend: 09/28/2023.    12-14 Month ICM trend:     Karie Soda, RN 09/29/2023 8:08 AM

## 2023-10-01 ENCOUNTER — Telehealth (HOSPITAL_COMMUNITY): Payer: Self-pay | Admitting: *Deleted

## 2023-10-02 ENCOUNTER — Ambulatory Visit (HOSPITAL_COMMUNITY)
Admission: RE | Admit: 2023-10-02 | Discharge: 2023-10-02 | Disposition: A | Discharge status: Home / Self Care | Payer: Medicare HMO | Source: Ambulatory Visit | Attending: Cardiology | Admitting: Cardiology

## 2023-10-02 DIAGNOSIS — K0889 Other specified disorders of teeth and supporting structures: Secondary | ICD-10-CM | POA: Diagnosis not present

## 2023-10-02 DIAGNOSIS — M278 Other specified diseases of jaws: Secondary | ICD-10-CM | POA: Diagnosis not present

## 2023-10-02 DIAGNOSIS — K8020 Calculus of gallbladder without cholecystitis without obstruction: Secondary | ICD-10-CM | POA: Diagnosis not present

## 2023-10-02 DIAGNOSIS — R160 Hepatomegaly, not elsewhere classified: Secondary | ICD-10-CM | POA: Diagnosis not present

## 2023-10-02 DIAGNOSIS — I5042 Chronic combined systolic (congestive) and diastolic (congestive) heart failure: Secondary | ICD-10-CM | POA: Diagnosis not present

## 2023-10-02 DIAGNOSIS — I517 Cardiomegaly: Secondary | ICD-10-CM | POA: Diagnosis not present

## 2023-10-02 DIAGNOSIS — R42 Dizziness and giddiness: Secondary | ICD-10-CM | POA: Diagnosis not present

## 2023-10-02 DIAGNOSIS — I509 Heart failure, unspecified: Secondary | ICD-10-CM | POA: Diagnosis not present

## 2023-10-02 DIAGNOSIS — I700 Atherosclerosis of aorta: Secondary | ICD-10-CM | POA: Diagnosis not present

## 2023-10-02 LAB — DRVVT CONFIRM: dRVVT Confirm: 1.2 {ratio} (ref 0.8–1.2)

## 2023-10-02 LAB — LUPUS ANTICOAGULANT PANEL
DRVVT: 71.9 s — ABNORMAL HIGH (ref 0.0–47.0)
PTT Lupus Anticoagulant: 33.6 s (ref 0.0–43.5)

## 2023-10-02 LAB — ANTITHROMBIN PANEL
AT III AG PPP IMM-ACNC: 85 % (ref 72–124)
Antithrombin Activity: 113 % (ref 75–135)

## 2023-10-02 LAB — DRVVT MIX: dRVVT Mix: 51.9 s — ABNORMAL HIGH (ref 0.0–40.4)

## 2023-10-05 ENCOUNTER — Institutional Professional Consult (permissible substitution) (INDEPENDENT_AMBULATORY_CARE_PROVIDER_SITE_OTHER): Payer: Medicare HMO | Admitting: Cardiothoracic Surgery

## 2023-10-05 ENCOUNTER — Encounter: Payer: Self-pay | Admitting: Cardiothoracic Surgery

## 2023-10-05 VITALS — BP 109/75 | HR 72 | Resp 20 | Ht 69.0 in

## 2023-10-05 DIAGNOSIS — Z01810 Encounter for preprocedural cardiovascular examination: Secondary | ICD-10-CM | POA: Insufficient documentation

## 2023-10-05 DIAGNOSIS — I5042 Chronic combined systolic (congestive) and diastolic (congestive) heart failure: Secondary | ICD-10-CM | POA: Diagnosis not present

## 2023-10-05 NOTE — Progress Notes (Signed)
CSW met with patient, ex-wife and mother in the office today to complete LVAD Psychosocial Assessment. Patient and support family members met with Dr. Alla German and patient appeared to ask good questions. Patient states that he has been following a [employer] on LVADs and learning as much as he can in preparation for work up. He did ask if he needed this pump and shared that he is initially hesitant to proceed with getting it.   During the assessment process, Patient shared that he lives with his ex wife and that they divorced 5 years ago. He states that he just divorced his most recent wife 2 months ago. CSW reiterated the caregiver role as described by Dr Maren Beach and explained the 24/7 caregiver role for the 1st two weeks post op and the ongoing role of dressing changes. CSW asked patient who would be his primary caregiver and was unable at this time to identify someone. He asked "What if I don't have a caregiver and would insurance pay". CSW explained the role of caregiver and unlikely that insurance would pay for the support needed 24/7 and ongoing dressing changes. Patient was unsure and states will need some time to think and come up with a plan. Neither patient's ex wife or mother offered to be caregiver during the meeting.  CSW suggested that patient absorb all the information discussed today and contact CSW to continue the assessment process when ready. Patient appears a bit hesitant at this time although appears to be exploring further information regarding life with an LVAD. Patient will reach out to CSW regarding continuation of LVAD Psychosocial Assessment. CSW will discuss with team and offer to meet with an LVAD patient to assist with further decision making regarding continuing with work up. Lasandra Beech, LCSW, CCSW-MCS [phone]

## 2023-10-05 NOTE — Progress Notes (Signed)
PCP is Etta Grandchild, MD Referring Provider is Laurey Morale, MD  Chief Complaint  Patient presents with   Congestive Heart Failure    LVAD consult  Patient examined, images of most recent echocardiogram and CT scan of chest and abdomen and right heart cath data and PFT data all personally reviewed and counseled with patient and family.  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  He has been fairly well compensated and followed carefully in the advanced heart failure clinic and has responded to guideline directed medical therapy.  His ejection fraction has been 20 to 25% with normal LV function by echo.  He has had no significant valvular disease and has remained in sinus rhythm on amiodarone.  He has had a BiV pacer AICD placed and is mainly BiV pacing on recorded data and he has never received a shock from the AICD since it was placed over 2 years ago.  The patient's renal hepatic and neurologic function remain stable.  He was last hospitalized for heart failure over 2 years ago.  The patient was severely overweight, over 400 pounds and was started on GLP-1 and now weighs 320 pounds. His nutritional intake sounds very poor.  He recently was hospitalized for some diarrhea and abdominal cramping and the dose of the GLP-1 was reduced. 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  He has been on Xarelto for several years without any GI bleeding problems.  Patient's most recent right heart cath shows mildly elevated filling pressures, normal papi, reduced cardiac index 1.8 but mixed venous saturation 65%   Right Heart PressuresRHC Procedural Findings: Hemodynamics (mmHg) RA mean 9 RV 39/13 PA 38/15, mean 29 PCWP mean 22  Oxygen saturations: PA 65% AO 99%  Cardiac Output (Fick) 4.23  Cardiac Index (Fick) 1.69  PVR 1.65 WU  Cardiac Output (Thermo) 5.14  Cardiac Index (Thermo) 2.06 PVR 1.4  WU  PAPi 2  Past Medical History:  Diagnosis Date   Alcohol abuse    Anxiety state, unspecified    Atrial fibrillation (HCC)    CHF (congestive heart failure) (HCC)    Chronic systolic heart failure (HCC)    Diabetes mellitus, type II (HCC)    Edema    Gout    History of medication noncompliance    Migraine    Obesity, unspecified    Obstructive sleep apnea    Psychiatric disorder    Pulmonary embolism (HCC)    Shortness of breath     Past Surgical History:  Procedure Laterality Date   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   Procedure: BIV ICD INSERTION CRT-D;  Surgeon: Lanier Prude, MD;  Location: [facility] INVASIVE CV LAB;  Service: Cardiovascular;  Laterality: N/A;   CARDIAC CATHETERIZATION     CARDIAC CATHETERIZATION N/A 08/13/2015   Procedure: Right/Left Heart Cath and Coronary Angiography;  Surgeon: Laurey Morale, MD;  Location: [facility] INVASIVE CV LAB;  Service: Cardiovascular;  Laterality: N/A;   PACEMAKER INSERTION     RIGHT HEART CATH N/A 09/15/2023   Procedure: RIGHT HEART CATH;  Surgeon: Laurey Morale, MD;  Location: [facility] INVASIVE CV LAB;  Service: Cardiovascular;  Laterality: N/A;   RIGHT/LEFT HEART CATH AND CORONARY ANGIOGRAPHY N/A 04/14/2017   Procedure: Right/Left Heart Cath and Coronary Angiography;  Surgeon: Laurey Morale, MD;  Location: [facility] INVASIVE CV LAB;  Service: Cardiovascular;  Laterality: N/A;   TESTICLE SURGERY  Family History  Problem Relation Age of Onset   Cancer Mother        brain tumor   Hypertension Mother    Diabetes Father        Deceased, 4   Heart disease Maternal Grandmother    Hypertension Other        Family History   Stroke Other        Family History   Diabetes Other        Family History   Diabetes Daughter     Social History Social History   Tobacco Use   Smoking status: Some Days    Current packs/day: 0.00    Average packs/day: 0.5 packs/day for 30.0 years (15.0 ttl pk-yrs)    Types: Cigarettes    Start  date: 05/15/1990    Last attempt to quit: 05/15/2020    Years since quitting: 3.3   Smokeless tobacco: Never   Tobacco comments:    vaping - nicotine-free products  Vaping Use   Vaping status: Never Used  Substance Use Topics   Alcohol use: Not Currently    Alcohol/week: 0.0 standard drinks of alcohol   Drug use: No    Current Outpatient Medications  Medication Sig Dispense Refill   albuterol (VENTOLIN HFA) 108 (90 Base) MCG/ACT inhaler INHALE 1-2 PUFFS INTO THE LUNGS EVERY 4 (FOUR) HOURS AS NEEDED FOR SHORTNESS OF BREATH OR WHEEZING. 8.5 each 1   amiodarone (PACERONE) 200 MG tablet Take 0.5 tablets (100 mg total) by mouth daily. Patient taking once daily 90 tablet 1   atorvastatin (LIPITOR) 40 MG tablet Take 1 tablet (40 mg total) by mouth daily. 90 tablet 3   carvedilol (COREG) 6.25 MG tablet Take 1 tablet (6.25 mg total) by mouth 2 (two) times daily. 60 tablet 11   dapagliflozin propanediol (FARXIGA) 10 MG TABS tablet Take 1 tablet (10 mg total) by mouth daily. 90 tablet 1   losartan (COZAAR) 25 MG tablet Take 0.5 tablets (12.5 mg total) by mouth at bedtime. 15 tablet 3   methimazole (TAPAZOLE) 10 MG tablet Take 1 tablet (10 mg total) by mouth daily. 90 tablet 3   pantoprazole (PROTONIX) 40 MG tablet TAKE 1 TABLET BY MOUTH EVERY DAY 90 tablet 0   potassium chloride SA (KLOR-CON M) 20 MEQ tablet Take 2 tablets (40 mEq total) by mouth daily. (Patient taking differently: Take 20 mEq by mouth 2 (two) times daily.) 180 tablet 3   PRESCRIPTION MEDICATION Inhale into the lungs See admin instructions. Bipap, pressure 16/12 with 2L of O2 - use whenever sleeping     sildenafil (VIAGRA) 50 MG tablet Take 50 mg by mouth as needed for erectile dysfunction.     spironolactone (ALDACTONE) 25 MG tablet Take 1 tablet (25 mg total) by mouth at bedtime. 90 tablet 3   tirzepatide (MOUNJARO) 12.5 MG/0.5ML Pen Inject 12.5 mg into the skin once a week. 2 mL 0   torsemide (DEMADEX) 20 MG tablet Take 2 tablets  (40 mg total) by mouth every morning AND 1 tablet (20 mg total) every evening. Take 40 mg by mouth every other day alternating with 20 mg every other day. 60 tablet 6   XARELTO 20 MG TABS tablet Take 1 tablet (20 mg total) by mouth daily with supper. 90 tablet 1   No current facility-administered medications for this visit.    Allergies  Allergen Reactions   Ace Inhibitors Anaphylaxis and Swelling    Angioedema   Isosorbide Anaphylaxis   Bidil [  Isosorb Dinitrate-Hydralazine] Other (See Comments)    headache   Digoxin And Related     Unspecified "side effects"   Buspirone Other (See Comments)    dizziness    Review of Systems:                   Review of Systems :  [ y ] = yes, [  ] = no        General :  Weight gain [   ]    Weight loss  [ y  ]  Fatigue [  ]  Fever [  ]  Chills  [  ]                                          HEENT    Headache [  ]  Dizziness [  ]  Blurred vision [  ] Glaucoma  [  ]                          Nosebleeds [  ] Painful or loose teeth [  ]        Cardiac :  Chest pain/ pressure [  ]  Resting SOB [  ] exertional SOB [  ]                        Orthopnea [  ]  Pedal edema  [  ]  Palpitations [  ] Syncope/presyncope [ ]                         Paroxysmal nocturnal dyspnea [  ]         Pulmonary : cough [  ]  wheezing [  ]  Hemoptysis [  ] Sputum [  ] Snoring [  ]                              Pneumothorax [  ]  Sleep apnea [  ]        GI : Vomiting [  ]  Dysphagia [  ]  Melena  [  ]  Abdominal pain [  ] BRBPR [  ]              Heart burn [  ]  Constipation [ y ] Diarrhea  Cove.Etienne  ] Colonoscopy [   ] abdominal cramping and diarrhea from wegovy        GU : Hematuria [  ]  Dysuria [ y treated UTI]  Nocturia [  ] UTI's [  ]        Vascular : Claudication [  ]  Rest pain [  ]  DVT [ y remote] Vein stripping [  ] leg ulcers [  ]                          TIA [  ] Stroke [  ]  Varicose veins Cove.Etienne  ]        NEURO :  Headaches  [  ] Seizures [  ] Vision changes [   ] Paresthesias [  ]  Musculoskeletal :  Arthritis [  ] Gout  [  ]  Back pain [  ]  Joint pain [  ]        Skin :  Rash [  ]  Melanoma [  ] Sores [  ]        Heme : Bleeding problems [  ]Clotting Disorders [  ] Anemia [  ]Blood Transfusion [ ]         Endocrine : Diabetes [y type II A1c 5.5] Heat or Cold intolerance [  ] Polyuria [  ]excessive thirst [ ]         Psych : Depression [  ]  Anxiety [  ]  Psych hospitalizations [  ] Memory change [  ]       Right-hand-dominant No history of abdominal or thoracic surgery, trauma No bleeding diathesis on Xarelto PFTs with normal mechanics and diffusion capacity                                                                   BP 109/75   Pulse 72   Resp 20   Ht 5\' 9"  (1.753 m)   SpO2 98% Comment: RA  BMI 46.55 kg/m  Physical Exam:      Physical Exam  General: Morbidly obese middle-age male no acute distress HEENT: Normocephalic pupils equal , dentition adequate Neck: Supple without JVD, adenopathy, or bruit Chest: Clear to auscultation, symmetrical breath sounds, no rhonchi, no tenderness             or deformity Cardiovascular: Regular rate and rhythm, no murmur, no gallop, peripheral pulses             palpable in all extremities Abdomen:  Soft, nontender, no palpable mass or organomegaly Extremities: Warm, well-perfused, no clubbing or cyanosis , 2+edema without tenderness,               2+ venous stasis changes of the legs Rectal/GU: Deferred Neuro: Grossly non--focal and symmetrical throughout Skin: Clean and dry without rash or ulceration   Diagnostic Tests: Echocardiogram results reviewed showing EF 20-25% with severe LV dilatation (8 cm diameter) with minimal dysfunction of the RV, minimal valvular disease and AICD pacer wires in place.  CT scan of the chest shows no significant disease of the great vessels, pleural effusion, abnormal adenopathy, or pulmonary  nodules.  Impression: Nonischemic cardiomyopathy from earlier heavy alcohol intake, now fairly well compensated  Morbid obesity History of DVT/PE Type 2 diabetes Severe weight loss with probable poor nutritional status  I discussed the procedure of VAD implantation detail with the patient and his family including the details of the surgery, the expected postoperative hospital recovery, and the long-term benefits of survival and improve quality life and the surgical risks of bleeding, infection, pulmonary insufficiency requiring prolonged ventilation, organ failure, and death.  We reviewed several of their pertinent questions regarding the operative procedure and the perioperative care.  Plan: The patient appears to be potential candidate for implantable VAD therapy of his nonischemic cardiomyopathy and CHF.  His hemodynamics and symptoms appear to be fairly well compensated at this time and he feels his symptoms have been stable for several months.  Concerns for surgical implantation include his morbid obesity and risk of  pulmonary complications and prolonged ventilation.  I am also concerned over his nutritional status with severe weight loss and poor oral intake over a long period of time.  I did not see prealbumin value, serum albumin  is 3.1.   Lovett Sox, MD [facility] [phone]

## 2023-10-06 ENCOUNTER — Telehealth: Payer: Self-pay | Admitting: Oncology

## 2023-10-06 ENCOUNTER — Encounter (HOSPITAL_COMMUNITY): Payer: Medicare HMO | Admitting: Cardiology

## 2023-10-06 ENCOUNTER — Ambulatory Visit: Payer: Medicare HMO | Attending: Cardiology

## 2023-10-06 DIAGNOSIS — Z95810 Presence of automatic (implantable) cardiac defibrillator: Secondary | ICD-10-CM

## 2023-10-06 DIAGNOSIS — I5042 Chronic combined systolic (congestive) and diastolic (congestive) heart failure: Secondary | ICD-10-CM

## 2023-10-06 LAB — FACTOR 5 LEIDEN

## 2023-10-06 NOTE — Telephone Encounter (Signed)
Patient called in on 10/06/2023 in regards to cancelling their appointment, patient stated that they will follow up with heart doctor instead and that this appointment was no longer needed

## 2023-10-07 ENCOUNTER — Encounter (HOSPITAL_COMMUNITY): Payer: Medicare HMO

## 2023-10-07 NOTE — Progress Notes (Signed)
EPIC Encounter for ICM Monitoring  Patient Name: [name] is a 57 y.o. male Date: 10/07/2023 Primary Care Physican: Etta Grandchild, MD Primary Cardiologist: Shirlee Latch Electrophysiologist: Townsend Roger Pacing: 95% 02/04/2023 Weight:  358 lbs       06/01/2023 Weight: 328 lbs (losing weight taking Mounjaro)   06/09/2023 Weight: 328 lbs             07/07/2023 Weight: 308 lbs   9/10/202 Weight: 308 lbs      09/16/2023 Weight: 317 lbs      Spoke with patient and heart failure questions reviewed.  Transmission results reviewed.  Pt asymptomatic for fluid accumulation.   He is trying to make a decision if he wants to get an LVAD and currently having  a work up done.   CorVue thoracic impedance suggesting fluid levels returned to normal after taking Torsemide morning and afternoon dosage x 2 days.  Difficulty balancing fluid levels.   Prescribed:  Torsemide 20 mg Take 2 tablet(s) (40 mg total) every morning and 1 tablet (20 mg total) every afternoon.  09/23/2023 Pt reports he is only taking Torsemide 40 mg in the morning and no afternoon dose.  Potassium 20 mEq take 2 tablet(s) (40 mEq total) by mouth daily. Spironolactone 25 mg take 1 tablet daily   Labs: 09/29/2023 Creatinine 1.44, BUN 16, Potassium 3.6, Sodium 139, GFR 57 09/15/2023 Potassium 3.6, Sodium 138 08/28/2023 Creatinine 1.39, BUN 15, Potassium 3.6, Sodium 136, GFR >60 07/30/2023 Creatinine 1.62, BUN 24, Potassium 3.2, Sodium 136, GFR 50 06/16/2023 Creatinine 1.10, BUN 13, Potassium 3.5, Sodium 136, GFR >60 02/06/2023 Creatinine 1.86, BUN 24, Potassium 4.2, Sodium 138, GFR 40.56 01/09/2023 Creatinine 1.68, BUN 18, Potassium 3.2, Sodium 139, GFR 48  12/23/2022 Creatinine 1.53, BUN 15, Potassium 3.9, Sodium 137, GFR 54  A complete set of results can be found in Results Review.   Recommendations:  No changes and encouraged to call if experiencing any fluid symptoms.   Follow-up plan: ICM clinic phone appointment 11/02/2023.   91 day  device clinic remote transmission 10/27/2023.     EP/Cardiology Office Visits:    Recall 08/09/2023 with Francis Dowse, PA   Copy of ICM check sent to Dr. Lalla Brothers.  3 month ICM trend: 10/05/2023.    12-14 Month ICM trend:     Karie Soda, RN 10/07/2023 1:54 PM

## 2023-10-08 ENCOUNTER — Ambulatory Visit: Payer: Medicare HMO | Admitting: Internal Medicine

## 2023-10-09 ENCOUNTER — Encounter: Payer: Medicare HMO | Admitting: Oncology

## 2023-10-09 ENCOUNTER — Other Ambulatory Visit: Payer: Medicare HMO

## 2023-10-12 ENCOUNTER — Telehealth: Payer: Self-pay | Admitting: Pharmacist

## 2023-10-12 NOTE — Telephone Encounter (Signed)
Called pt to follow up on tolerability of Mounjaro 12.5mg . LVM for patient to call back

## 2023-10-12 NOTE — Telephone Encounter (Signed)
Patient called back, reports he is experiencing  constipation and stomach cramping at 12.5 mg Mounjaro, still has 3 doses left. Suggest to treat constipation with OTC laxative.   He is going through bunch of test for heart transplant so he may need to come off of Mounjaro. He will continue taking till his get instructions from his doctor to d/c. He will contact us if he needs more refill for [facility].

## 2023-10-23 ENCOUNTER — Emergency Department (HOSPITAL_COMMUNITY)
Admission: EM | Admit: 2023-10-23 | Discharge: 2023-10-23 | Disposition: A | Discharge status: Home / Self Care | Payer: Medicare HMO | Attending: Student | Admitting: Student

## 2023-10-23 ENCOUNTER — Encounter (HOSPITAL_COMMUNITY): Payer: Self-pay

## 2023-10-23 ENCOUNTER — Other Ambulatory Visit: Payer: Self-pay

## 2023-10-23 ENCOUNTER — Emergency Department (HOSPITAL_COMMUNITY): Payer: Medicare HMO

## 2023-10-23 DIAGNOSIS — M25572 Pain in left ankle and joints of left foot: Secondary | ICD-10-CM | POA: Diagnosis not present

## 2023-10-23 DIAGNOSIS — S93492A Sprain of other ligament of left ankle, initial encounter: Secondary | ICD-10-CM | POA: Diagnosis not present

## 2023-10-23 DIAGNOSIS — Z7901 Long term (current) use of anticoagulants: Secondary | ICD-10-CM | POA: Insufficient documentation

## 2023-10-23 DIAGNOSIS — M79672 Pain in left foot: Secondary | ICD-10-CM | POA: Diagnosis not present

## 2023-10-23 DIAGNOSIS — X509XXA Other and unspecified overexertion or strenuous movements or postures, initial encounter: Secondary | ICD-10-CM | POA: Diagnosis not present

## 2023-10-23 DIAGNOSIS — M19072 Primary osteoarthritis, left ankle and foot: Secondary | ICD-10-CM | POA: Diagnosis not present

## 2023-10-23 DIAGNOSIS — S93432A Sprain of tibiofibular ligament of left ankle, initial encounter: Secondary | ICD-10-CM | POA: Diagnosis not present

## 2023-10-23 DIAGNOSIS — Q6689 Other  specified congenital deformities of feet: Secondary | ICD-10-CM | POA: Diagnosis not present

## 2023-10-23 NOTE — Discharge Instructions (Addendum)
You had x-rays of your ankle done today.  We did not see any broken bones.  I suspect that you have sprained your ankle.  Recommend taking over-the-counter Tylenol as needed for pain.  Do not exceed 4000 mg in a 24-hour period.  Recommend elevation of your left ankle is much as possible, especially when you are resting.  You may use the walking boot and crutches for the next 1 to 2 weeks, to help with pain.  I would recommend making an appointment with the orthopedic doctors as soon as possible to be seen in the next 1 to 2 weeks.  Please return if you have worsening pain or swelling, or new numbness in your foot.

## 2023-10-23 NOTE — ED Triage Notes (Signed)
Pt states he was stretching left ankle and felt a pop 2 days ago and has not been able to put weight on it. Pt states the pain has gotten worse.

## 2023-10-23 NOTE — ED Provider Notes (Signed)
[place] EMERGENCY DEPARTMENT AT [facility] Provider Note   CSN: [id number] Arrival date & time: 10/23/23  1152     History  Chief Complaint  Patient presents with   Ankle Pain    [name] is a 57 y.o. male.  57 year old male presents here for concerns of acute onset left ankle pain.  Patient states that 2 days ago, he was stretching his left ankle when he heard a pop.  He has had pain with weightbearing and ambulation since then.  Pain is primarily located around the left lateral malleolus.  He denies any other trauma to the ankle.  He has noticed some swelling in his ankle.  The history is provided by the patient.  Ankle Pain      Home Medications Prior to Admission medications   Medication Sig Start Date End Date Taking? Authorizing Provider  albuterol (VENTOLIN HFA) 108 (90 Base) MCG/ACT inhaler INHALE 1-2 PUFFS INTO THE LUNGS EVERY 4 (FOUR) HOURS AS NEEDED FOR SHORTNESS OF BREATH OR WHEEZING. 06/04/22   Laurey Morale, MD  amiodarone (PACERONE) 200 MG tablet Take 0.5 tablets (100 mg total) by mouth daily. Patient taking once daily 10/10/22   Laurey Morale, MD  atorvastatin (LIPITOR) 40 MG tablet Take 1 tablet (40 mg total) by mouth daily. 06/16/23   Milford, Anderson Malta, FNP  carvedilol (COREG) 6.25 MG tablet Take 1 tablet (6.25 mg total) by mouth 2 (two) times daily. 06/16/23   Milford, Anderson Malta, FNP  dapagliflozin propanediol (FARXIGA) 10 MG TABS tablet Take 1 tablet (10 mg total) by mouth daily. 06/16/23   Milford, Anderson Malta, FNP  losartan (COZAAR) 25 MG tablet Take 0.5 tablets (12.5 mg total) by mouth at bedtime. 07/30/23   Robbie Lis M, PA-C  methimazole (TAPAZOLE) 10 MG tablet Take 1 tablet (10 mg total) by mouth daily. 08/31/23   Laurey Morale, MD  pantoprazole (PROTONIX) 40 MG tablet TAKE 1 TABLET BY MOUTH EVERY DAY 07/28/23   Etta Grandchild, MD  potassium chloride SA (KLOR-CON M) 20 MEQ tablet Take 2 tablets (40 mEq total) by mouth  daily. Patient taking differently: Take 20 mEq by mouth 2 (two) times daily. 06/16/23   Milford, Anderson Malta, FNP  PRESCRIPTION MEDICATION Inhale into the lungs See admin instructions. Bipap, pressure 16/12 with 2L of O2 - use whenever sleeping    [provider]  sildenafil (VIAGRA) 50 MG tablet Take 50 mg by mouth as needed for erectile dysfunction.    [provider]  spironolactone (ALDACTONE) 25 MG tablet Take 1 tablet (25 mg total) by mouth at bedtime. 06/16/23   Milford, Anderson Malta, FNP  tirzepatide [facility]) 12.5 MG/0.5ML Pen Inject 12.5 mg into the skin once a week. 07/13/23   Laurey Morale, MD  torsemide (DEMADEX) 20 MG tablet Take 2 tablets (40 mg total) by mouth every morning AND 1 tablet (20 mg total) every evening. Take 40 mg by mouth every other day alternating with 20 mg every other day. 09/15/23   Laurey Morale, MD  XARELTO 20 MG TABS tablet Take 1 tablet (20 mg total) by mouth daily with supper. 06/25/23   Etta Grandchild, MD  pravastatin (PRAVACHOL) 40 MG tablet Take 40 mg by mouth daily.  02/20/12  [provider]      Allergies    Ace inhibitors, Isosorbide, Bidil [isosorb dinitrate-hydralazine], Digoxin and related, and Buspirone    Review of Systems   As noted in HPI  Physical Exam Updated Vital Signs BP (!) 131/96 (BP Location: Left Arm)   Pulse 84   Temp 98.5 F (36.9 C) (Oral)   Resp 15   Ht 5\' 9"  (1.753 m)   Wt (!) 142 kg   SpO2 100%   BMI 46.22 kg/m  Physical Exam Vitals reviewed.  Constitutional:      General: He is not in acute distress.    Appearance: He is obese. He is not ill-appearing, toxic-appearing or diaphoretic.  Cardiovascular:     Rate and Rhythm: Normal rate and regular rhythm.     Pulses:          Dorsalis pedis pulses are 2+ on the left side.  Pulmonary:     Effort: Pulmonary effort is normal.  Musculoskeletal:     Right lower leg: Normal. No swelling. No edema.     Left lower leg: Normal. No swelling. No  edema.     Right ankle: Normal.     Left ankle: Swelling present. No deformity or ecchymosis. Tenderness present over the lateral malleolus and ATF ligament. Normal pulse.     Left Achilles Tendon: Normal. No tenderness or defects. Thompson's test negative.  Skin:    General: Skin is warm and dry.  Neurological:     Mental Status: He is alert.     Comments: Sensation intact to bilateral lower extremities distal to the knee.     ED Results / Procedures / Treatments   Labs (all labs ordered are listed, but only abnormal results are displayed) Labs Reviewed - No data to display  EKG None  Radiology DG Ankle Complete Left  Result Date: 10/23/2023 CLINICAL DATA:  Patient was stretching left ankle and felt a pop 2 days ago. Pain. EXAM: LEFT ANKLE COMPLETE - 3+ VIEW; LEFT FOOT - COMPLETE 3+ VIEW COMPARISON:  None Available. FINDINGS: Left ankle: The ankle mortise is symmetric and intact. Mild distal fibular and medial malleolar degenerative spurring. Moderate-to-large distal dorsal talar neck spur/ "talar beak." Mild distal anterior tibial plafond degenerative osteophytosis. No definite calcaneonavicular or talocalcaneal coalition is seen. Moderate dorsal talonavicular subchondral sclerosis. Moderate dorsal navicular-cuneiform and second tarsometatarsal degenerative osteophytosis. Mild second tarsometatarsal joint space narrowing. Left foot: Moderate to severe great toe metatarsophalangeal joint space narrowing, subchondral sclerosis, and peripheral osteophytosis. Mild calcaneocuboid peripheral osteophytosis. Small plantar and posterior calcaneal heel spurs. No acute fracture or dislocation. IMPRESSION: 1. No acute fracture. 2. Moderate-to-large distal dorsal talar neck spur/ "talar beak." This can be seen with hindfoot tarsal coalition, however no definite tarsal coalition is seen. Likely associated osteophytosis at the anterior tibiotalar and dorsal talonavicular articulations. 3. Moderate midfoot  osteoarthritis. 4. 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 Electronically Signed   By: Neita Garnet M.D.   On: 10/23/2023 12:54   DG Foot Complete Left  Result Date: 10/23/2023 CLINICAL DATA:  Patient was stretching left ankle and felt a pop 2 days ago. Pain. EXAM: LEFT ANKLE COMPLETE - 3+ VIEW; LEFT FOOT - COMPLETE 3+ VIEW COMPARISON:  None Available. FINDINGS: Left ankle: The ankle mortise is symmetric and intact. Mild distal fibular and medial malleolar degenerative spurring. Moderate-to-large distal dorsal talar neck spur/ "talar beak." Mild distal anterior tibial plafond degenerative osteophytosis. No definite calcaneonavicular or talocalcaneal coalition is seen. Moderate dorsal talonavicular subchondral sclerosis. Moderate dorsal navicular-cuneiform and second tarsometatarsal degenerative osteophytosis. Mild second tarsometatarsal joint space narrowing. Left foot: Moderate to severe great toe metatarsophalangeal joint space narrowing, subchondral sclerosis, and peripheral osteophytosis. Mild calcaneocuboid peripheral osteophytosis. Small  plantar and posterior calcaneal heel spurs. No acute fracture or dislocation. IMPRESSION: 1. No acute fracture. 2. Moderate-to-large distal dorsal talar neck spur/ "talar beak." This can be seen with hindfoot tarsal coalition, however no definite tarsal coalition is seen. Likely associated osteophytosis at the anterior tibiotalar and dorsal talonavicular articulations. 3. Moderate midfoot osteoarthritis. 4. 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 Electronically Signed   By: Neita Garnet M.D.   On: 10/23/2023 12:54    Procedures Procedures    Medications Ordered in ED Medications - No data to display  ED Course/ Medical Decision Making/ A&P                                 Medical Decision Making Amount and/or Complexity of Data Reviewed Radiology: ordered and independent interpretation  performed.   57 year old male presents here for concerns of left ankle pain.  Vitals reassuring.  On exam, patient has localized swelling to the left ankle with exquisite tenderness to the anterior and posterior portions of the left lateral malleolus.  Swelling does not extend proximally up the leg.  No tenderness to palpation of the left Achilles.  Thompson test negative.  Sensation intact to distal, bilateral lower extremities.  Patient has 2+ DP pulse of the left foot.  No fifth metatarsal tenderness.    Initial differential diagnosis includes acute fracture, neurovascular injury, sprain/strain.  X-rays of the left ankle and foot obtained.  Independently reviewed the patient's imaging, which does not demonstrate any evidence of fracture.  Patient's presentation is most consistent with musculoskeletal sprain.  Patient is currently on anticoagulation, therefore unable to take NSAIDs.  He was recommended to take Tylenol as needed for pain.  Patient advised to rest, ice, elevate his extremity.  He was provided with a removable Aircast to use for the next 1 to 2 weeks for comfort as well as crutches to assist him in ambulating, as he has been hesitant to take his diuretics because he cannot get to the bathroom quick enough.  Patient would benefit from follow-up and reevaluation by orthopedic provider in 1 to 2 weeks.  Advised the patient to call and schedule an appointment later today.  Patient provided with orthopedic clinic information.  Patient does not meet criteria for admission.  He is felt to be appropriate for outpatient management.  Discharged in stable condition.  Patient's presentation is most consistent with acute, uncomplicated illness.         Final Clinical Impression(s) / ED Diagnoses Final diagnoses:  Sprain of anterior talofibular ligament of left ankle, initial encounter    Rx / DC Orders ED Discharge Orders     None         Rolla Flatten, MD 10/23/23 1737     Glendora Score, MD 10/23/23 1911

## 2023-10-27 ENCOUNTER — Ambulatory Visit (INDEPENDENT_AMBULATORY_CARE_PROVIDER_SITE_OTHER): Payer: Medicare HMO

## 2023-10-27 DIAGNOSIS — I428 Other cardiomyopathies: Secondary | ICD-10-CM

## 2023-10-27 DIAGNOSIS — I5022 Chronic systolic (congestive) heart failure: Secondary | ICD-10-CM

## 2023-10-27 LAB — CUP PACEART REMOTE DEVICE CHECK
Battery Remaining Longevity: 62 mo
Battery Remaining Percentage: 71 %
Battery Voltage: 2.98 V
Brady Statistic AP VP Percent: 63 %
Brady Statistic AP VS Percent: 3 %
Brady Statistic AS VP Percent: 32 %
Brady Statistic AS VS Percent: 1 %
Brady Statistic RA Percent Paced: 65 %
Date Time Interrogation Session: 20241125211439
HighPow Impedance: 72 Ohm
Implantable Lead Connection Status: 753985
Implantable Lead Connection Status: 753985
Implantable Lead Connection Status: 753985
Implantable Lead Implant Date: 20230213
Implantable Lead Implant Date: 20230213
Implantable Lead Implant Date: 20230213
Implantable Lead Location: 753857
Implantable Lead Location: 753859
Implantable Lead Location: 753860
Implantable Pulse Generator Implant Date: 20230213
Lead Channel Impedance Value: 440 Ohm
Lead Channel Impedance Value: 500 Ohm
Lead Channel Impedance Value: 850 Ohm
Lead Channel Pacing Threshold Amplitude: 0.75 V
Lead Channel Pacing Threshold Amplitude: 0.75 V
Lead Channel Pacing Threshold Amplitude: 0.75 V
Lead Channel Pacing Threshold Pulse Width: 0.5 ms
Lead Channel Pacing Threshold Pulse Width: 0.5 ms
Lead Channel Pacing Threshold Pulse Width: 0.5 ms
Lead Channel Sensing Intrinsic Amplitude: 12 mV
Lead Channel Sensing Intrinsic Amplitude: 4.1 mV
Lead Channel Setting Pacing Amplitude: 1.5 V
Lead Channel Setting Pacing Amplitude: 2 V
Lead Channel Setting Pacing Amplitude: 2.5 V
Lead Channel Setting Pacing Pulse Width: 0.5 ms
Lead Channel Setting Pacing Pulse Width: 0.5 ms
Lead Channel Setting Sensing Sensitivity: 0.5 mV
Pulse Gen Serial Number: 210000080
Zone Setting Status: 755011

## 2023-10-28 DIAGNOSIS — G4733 Obstructive sleep apnea (adult) (pediatric): Secondary | ICD-10-CM | POA: Diagnosis not present

## 2023-11-02 ENCOUNTER — Ambulatory Visit: Payer: Medicare HMO | Attending: Cardiology

## 2023-11-02 DIAGNOSIS — Z95810 Presence of automatic (implantable) cardiac defibrillator: Secondary | ICD-10-CM

## 2023-11-02 DIAGNOSIS — I5042 Chronic combined systolic (congestive) and diastolic (congestive) heart failure: Secondary | ICD-10-CM

## 2023-11-02 NOTE — Progress Notes (Signed)
EPIC Encounter for ICM Monitoring  Patient Name: [name] is a 57 y.o. male Date: 11/02/2023 Primary Care Physican: Etta Grandchild, MD Primary Cardiologist: Shirlee Latch Electrophysiologist: Townsend Roger Pacing: 95% 02/04/2023 Weight:  358 lbs       06/01/2023 Weight: 328 lbs (losing weight taking Mounjaro)   06/09/2023 Weight: 328 lbs             07/07/2023 Weight: 308 lbs   9/10/202 Weight: 308 lbs      09/16/2023 Weight: 317 lbs      Spoke with patient and heart failure questions reviewed.  Transmission results reviewed.  Pt reports SOB and is currently out of town for the holiday.   CorVue thoracic impedance suggesting possible fluid accumulation starting 11/28.   Prescribed:  Torsemide 20 mg Take 2 tablet(s) (40 mg total) every morning and 1 tablet (20 mg total) every afternoon.  09/23/2023 Pt reports he is only taking Torsemide 40 mg in the morning and no afternoon dose.  Potassium 20 mEq take 2 tablet(s) (40 mEq total) by mouth daily. Spironolactone 25 mg take 1 tablet daily   Labs: 09/29/2023 Creatinine 1.44, BUN 16, Potassium 3.6, Sodium 139, GFR 57 09/15/2023 Potassium 3.6, Sodium 138 08/28/2023 Creatinine 1.39, BUN 15, Potassium 3.6, Sodium 136, GFR >60 07/30/2023 Creatinine 1.62, BUN 24, Potassium 3.2, Sodium 136, GFR 50 06/16/2023 Creatinine 1.10, BUN 13, Potassium 3.5, Sodium 136, GFR >60 02/06/2023 Creatinine 1.86, BUN 24, Potassium 4.2, Sodium 138, GFR 40.56 01/09/2023 Creatinine 1.68, BUN 18, Potassium 3.2, Sodium 139, GFR 48  12/23/2022 Creatinine 1.53, BUN 15, Potassium 3.9, Sodium 137, GFR 54  A complete set of results can be found in Results Review.   Recommendations:  Upon return to home tomorrow, he will take prescribed Torsemide dosage of 2 tablets in AM and 1 tablet in PM x 3 days.  Pt does not usually take afternoon dosage.    Follow-up plan: ICM clinic phone appointment 11/09/2023 to recheck fluid levels.   91 day device clinic remote transmission 10/27/2023.      EP/Cardiology Office Visits:    Recall 08/09/2023 with Francis Dowse, PA   Copy of ICM check sent to Dr. Lalla Brothers.  3 month ICM trend: 11/02/2023.    12-14 Month ICM trend:     Karie Soda, RN 11/02/2023 1:35 PM

## 2023-11-04 MED ORDER — MOUNJARO 5 MG/0.5ML ~~LOC~~ SOAJ: 5.0000 mg | SUBCUTANEOUS | 0 refills | Status: DC

## 2023-11-04 NOTE — Addendum Note (Signed)
Addended by: Malena Peer D on: 11/04/2023 10:02 AM   Modules accepted: Orders

## 2023-11-04 NOTE — Telephone Encounter (Signed)
Patient hasn't had Mounjaro in 3 weeks because he went out of town and forgot it. Will need to resume at lower dose. Will restart at 5mg  as he didn't have any issues with this dose. He has some side effects at 12.5mg . May only titrate to 10mg . Will follow up with pt in 4 weeks.

## 2023-11-05 ENCOUNTER — Other Ambulatory Visit: Payer: Self-pay | Admitting: Internal Medicine

## 2023-11-05 DIAGNOSIS — K219 Gastro-esophageal reflux disease without esophagitis: Secondary | ICD-10-CM

## 2023-11-09 ENCOUNTER — Ambulatory Visit: Payer: Medicare HMO | Attending: Cardiology

## 2023-11-09 DIAGNOSIS — I5042 Chronic combined systolic (congestive) and diastolic (congestive) heart failure: Secondary | ICD-10-CM

## 2023-11-09 DIAGNOSIS — Z95810 Presence of automatic (implantable) cardiac defibrillator: Secondary | ICD-10-CM

## 2023-11-10 ENCOUNTER — Telehealth: Payer: Self-pay

## 2023-11-10 NOTE — Telephone Encounter (Signed)
Remote ICM transmission received.  Attempted call to patient regarding ICM remote transmission and left message per DPR with ICM phone number to return call.

## 2023-11-10 NOTE — Progress Notes (Signed)
EPIC Encounter for ICM Monitoring  Patient Name: [name] is a 57 y.o. male Date: 11/10/2023 Primary Care Physican: Etta Grandchild, MD Primary Cardiologist: Shirlee Latch Electrophysiologist: Townsend Roger Pacing: 95% 02/04/2023 Weight:  358 lbs       06/01/2023 Weight: 328 lbs (losing weight taking Mounjaro)   06/09/2023 Weight: 328 lbs             07/07/2023 Weight: 308 lbs   9/10/202 Weight: 308 lbs      09/16/2023 Weight: 317 lbs      Attempted call to patient and unable to reach.  Left message for return call.  Transmission results reviewed.    CorVue thoracic impedance suggesting fluid levels returned to normal after recommendation to take prescribed afternoon dose x 3 days.     Prescribed:  Torsemide 20 mg Take 2 tablet(s) (40 mg total) every morning and 1 tablet (20 mg total) every afternoon.  09/23/2023 Pt reports he is only taking Torsemide 40 mg in the morning and no afternoon dose.  Potassium 20 mEq take 2 tablet(s) (40 mEq total) by mouth daily. Spironolactone 25 mg take 1 tablet daily   Labs: 09/29/2023 Creatinine 1.44, BUN 16, Potassium 3.6, Sodium 139, GFR 57 09/15/2023 Potassium 3.6, Sodium 138 08/28/2023 Creatinine 1.39, BUN 15, Potassium 3.6, Sodium 136, GFR >60 07/30/2023 Creatinine 1.62, BUN 24, Potassium 3.2, Sodium 136, GFR 50 06/16/2023 Creatinine 1.10, BUN 13, Potassium 3.5, Sodium 136, GFR >60 02/06/2023 Creatinine 1.86, BUN 24, Potassium 4.2, Sodium 138, GFR 40.56 01/09/2023 Creatinine 1.68, BUN 18, Potassium 3.2, Sodium 139, GFR 48  12/23/2022 Creatinine 1.53, BUN 15, Potassium 3.9, Sodium 137, GFR 54  A complete set of results can be found in Results Review.   Recommendations:  Unable to reach.     Follow-up plan: ICM clinic phone appointment 12/07/2023.   91 day device clinic remote transmission 01/26/2024.     EP/Cardiology Office Visits:    Recall 08/09/2023 with Francis Dowse, PA   Copy of ICM check sent to Dr. Lalla Brothers.  3 month ICM trend:  11/08/2023.    12-14 Month ICM trend:     Karie Soda, RN 11/10/2023 3:34 PM

## 2023-11-13 DIAGNOSIS — I5020 Unspecified systolic (congestive) heart failure: Secondary | ICD-10-CM | POA: Diagnosis not present

## 2023-11-15 ENCOUNTER — Other Ambulatory Visit: Payer: Self-pay | Admitting: Internal Medicine

## 2023-11-15 DIAGNOSIS — Z86711 Personal history of pulmonary embolism: Secondary | ICD-10-CM

## 2023-11-15 DIAGNOSIS — I480 Paroxysmal atrial fibrillation: Secondary | ICD-10-CM
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
## 2023-11-24 DIAGNOSIS — M19072 Primary osteoarthritis, left ankle and foot: Secondary | ICD-10-CM | POA: Diagnosis not present

## 2023-11-24 DIAGNOSIS — S99912D Unspecified injury of left ankle, subsequent encounter: Secondary | ICD-10-CM | POA: Diagnosis not present

## 2023-11-24 DIAGNOSIS — X509XXD Other and unspecified overexertion or strenuous movements or postures, subsequent encounter: Secondary | ICD-10-CM | POA: Diagnosis not present

## 2023-11-24 DIAGNOSIS — Z950 Presence of cardiac pacemaker: Secondary | ICD-10-CM | POA: Diagnosis not present

## 2023-11-24 DIAGNOSIS — S99912A Unspecified injury of left ankle, initial encounter: Secondary | ICD-10-CM | POA: Diagnosis not present

## 2023-11-24 DIAGNOSIS — I509 Heart failure, unspecified: Secondary | ICD-10-CM | POA: Diagnosis not present

## 2023-11-24 DIAGNOSIS — M25572 Pain in left ankle and joints of left foot: Secondary | ICD-10-CM | POA: Diagnosis not present

## 2023-11-24 DIAGNOSIS — F17210 Nicotine dependence, cigarettes, uncomplicated: Secondary | ICD-10-CM | POA: Diagnosis not present

## 2023-11-26 NOTE — Progress Notes (Signed)
Remote ICD transmission.   

## 2023-11-27 DIAGNOSIS — G4733 Obstructive sleep apnea (adult) (pediatric): Secondary | ICD-10-CM | POA: Diagnosis not present

## 2023-12-03 ENCOUNTER — Telehealth: Payer: Self-pay | Admitting: Pharmacist

## 2023-12-03 MED ORDER — MOUNJARO 7.5 MG/0.5ML ~~LOC~~ SOAJ: 7.5000 mg | SUBCUTANEOUS | 0 refills | Status: DC

## 2023-12-03 NOTE — Telephone Encounter (Signed)
 Spoke with patient. He took his last dose of Mounjaro 5mg  yesterday. Will increase to 7.5mg . He is out of town. Rx sent [facility] in [facility].

## 2023-12-04 ENCOUNTER — Telehealth: Payer: Medicare HMO | Admitting: Psychiatry

## 2023-12-04 ENCOUNTER — Encounter: Payer: Self-pay | Admitting: Psychiatry

## 2023-12-04 DIAGNOSIS — K8020 Calculus of gallbladder without cholecystitis without obstruction: Secondary | ICD-10-CM | POA: Diagnosis not present

## 2023-12-04 DIAGNOSIS — I10 Essential (primary) hypertension: Secondary | ICD-10-CM | POA: Diagnosis not present

## 2023-12-04 DIAGNOSIS — I714 Abdominal aortic aneurysm, without rupture, unspecified: Secondary | ICD-10-CM | POA: Diagnosis not present

## 2023-12-04 DIAGNOSIS — F1021 Alcohol dependence, in remission: Secondary | ICD-10-CM

## 2023-12-04 DIAGNOSIS — G4700 Insomnia, unspecified: Secondary | ICD-10-CM | POA: Diagnosis not present

## 2023-12-04 DIAGNOSIS — F410 Panic disorder [episodic paroxysmal anxiety] without agoraphobia: Secondary | ICD-10-CM

## 2023-12-04 DIAGNOSIS — F4310 Post-traumatic stress disorder, unspecified: Secondary | ICD-10-CM | POA: Diagnosis not present

## 2023-12-04 DIAGNOSIS — I7121 Aneurysm of the ascending aorta, without rupture: Secondary | ICD-10-CM | POA: Diagnosis not present

## 2023-12-04 DIAGNOSIS — R197 Diarrhea, unspecified: Secondary | ICD-10-CM | POA: Diagnosis not present

## 2023-12-04 DIAGNOSIS — R0989 Other specified symptoms and signs involving the circulatory and respiratory systems: Secondary | ICD-10-CM | POA: Diagnosis not present

## 2023-12-04 DIAGNOSIS — F3131 Bipolar disorder, current episode depressed, mild: Secondary | ICD-10-CM | POA: Diagnosis not present

## 2023-12-04 DIAGNOSIS — I712 Thoracic aortic aneurysm, without rupture, unspecified: Secondary | ICD-10-CM | POA: Diagnosis not present

## 2023-12-04 DIAGNOSIS — R1084 Generalized abdominal pain: Secondary | ICD-10-CM | POA: Diagnosis not present

## 2023-12-04 NOTE — Patient Instructions (Signed)
 Start melatonin 6 mg, two hours before going to bed Next appointment- 2/26 at 1 40, video Referral to therapy onsite

## 2023-12-07 ENCOUNTER — Ambulatory Visit: Payer: Medicare HMO | Attending: Cardiology

## 2023-12-07 DIAGNOSIS — I5042 Chronic combined systolic (congestive) and diastolic (congestive) heart failure: Secondary | ICD-10-CM | POA: Diagnosis not present

## 2023-12-07 DIAGNOSIS — Z95810 Presence of automatic (implantable) cardiac defibrillator: Secondary | ICD-10-CM

## 2023-12-09 ENCOUNTER — Other Ambulatory Visit: Payer: Self-pay

## 2023-12-09 DIAGNOSIS — E118 Type 2 diabetes mellitus with unspecified complications: Secondary | ICD-10-CM

## 2023-12-09 NOTE — Progress Notes (Signed)
 EPIC Encounter for ICM Monitoring  Patient Name: [name] is a 58 y.o. male Date: 12/09/2023 Primary Care Physican: Adam Debby LITTIE, MD Primary Cardiologist: [name] Electrophysiologist: [name] Pacing: >99% 02/04/2023 Weight:  358 lbs       06/01/2023 Weight: 328 lbs (losing weight taking Mounjaro )   06/09/2023 Weight: 328 lbs             07/07/2023 Weight: 308 lbs   9/10/202 Weight: 308 lbs      09/16/2023 Weight: 317 lbs      Transmission results reviewed.    CorVue thoracic impedance suggesting normal fluid levels within the last month.     Prescribed:  Torsemide  20 mg Take 2 tablet(s) (40 mg total) every morning and 1 tablet (20 mg total) every afternoon.  09/23/2023 Pt reports he is only taking Torsemide  40 mg in the morning and no afternoon dose.  Potassium 20 mEq take 2 tablet(s) (40 mEq total) by mouth daily. Spironolactone  25 mg take 1 tablet daily   Labs: 12/04/2023 Creatinine 1.51, BUN 23, Potassium 3.7, Sodium 136, GFR 54 09/29/2023 Creatinine 1.44, BUN 16, Potassium 3.6, Sodium 139, GFR 57 09/15/2023 Potassium 3.6, Sodium 138 08/28/2023 Creatinine 1.39, BUN 15, Potassium 3.6, Sodium 136, GFR >60 07/30/2023 Creatinine 1.62, BUN 24, Potassium 3.2, Sodium 136, GFR 50 06/16/2023 Creatinine 1.10, BUN 13, Potassium 3.5, Sodium 136, GFR >60 02/06/2023 Creatinine 1.86, BUN 24, Potassium 4.2, Sodium 138, GFR 40.56 01/09/2023 Creatinine 1.68, BUN 18, Potassium 3.2, Sodium 139, GFR 48  12/23/2022 Creatinine 1.53, BUN 15, Potassium 3.9, Sodium 137, GFR 54  A complete set of results can be found in Results Review.   Recommendations:   No changes.   Follow-up plan: ICM clinic phone appointment 01/11/2024.   91 day device clinic remote transmission 01/26/2024.     EP/Cardiology Office Visits:    Recall 08/09/2023 with Adam Arthur, PA   Copy of ICM check sent to Dr. Cindie.  3 month ICM trend: 12/07/2023.    12-14 Month ICM trend:     Adam GORMAN Garner, RN 12/09/2023 3:34  PM

## 2023-12-16 ENCOUNTER — Other Ambulatory Visit: Payer: Medicare HMO

## 2023-12-16 DIAGNOSIS — E118 Type 2 diabetes mellitus with unspecified complications: Secondary | ICD-10-CM | POA: Diagnosis not present

## 2023-12-17 LAB — COMPREHENSIVE METABOLIC PANEL
AG Ratio: 1 (calc) (ref 1.0–2.5)
ALT: 7 U/L — ABNORMAL LOW (ref 9–46)
AST: 9 U/L — ABNORMAL LOW (ref 10–35)
Albumin: 3.8 g/dL (ref 3.6–5.1)
Alkaline phosphatase (APISO): 126 U/L (ref 35–144)
BUN/Creatinine Ratio: 12 (calc) (ref 6–22)
BUN: 20 mg/dL (ref 7–25)
CO2: 28 mmol/L (ref 20–32)
Calcium: 9.2 mg/dL (ref 8.6–10.3)
Chloride: 101 mmol/L (ref 98–110)
Creat: 1.61 mg/dL — ABNORMAL HIGH (ref 0.70–1.30)
Globulin: 3.7 g/dL (ref 1.9–3.7)
Glucose, Bld: 109 mg/dL — ABNORMAL HIGH (ref 65–99)
Potassium: 3.7 mmol/L (ref 3.5–5.3)
Sodium: 141 mmol/L (ref 135–146)
Total Bilirubin: 0.8 mg/dL (ref 0.2–1.2)
Total Protein: 7.5 g/dL (ref 6.1–8.1)

## 2023-12-17 LAB — HEMOGLOBIN A1C
Hgb A1c MFr Bld: 5.1 %{Hb} (ref <5.7)
Mean Plasma Glucose: 100 mg/dL
eAG (mmol/L): 5.5 mmol/L

## 2023-12-17 LAB — LIPID PANEL
Cholesterol: 140 mg/dL (ref <200)
HDL: 35 mg/dL — ABNORMAL LOW (ref >=40)
LDL Cholesterol (Calc): 83 mg/dL
Non-HDL Cholesterol (Calc): 105 mg/dL (ref <130)
Total CHOL/HDL Ratio: 4 (calc) (ref <5.0)
Triglycerides: 123 mg/dL (ref <150)

## 2023-12-22 ENCOUNTER — Ambulatory Visit (INDEPENDENT_AMBULATORY_CARE_PROVIDER_SITE_OTHER): Payer: Medicare HMO | Admitting: "Endocrinology

## 2023-12-22 ENCOUNTER — Encounter: Payer: Self-pay | Admitting: "Endocrinology

## 2023-12-22 VITALS — BP 100/78 | HR 76 | Temp 97.8°F | Ht 69.0 in | Wt 319.0 lb

## 2023-12-22 DIAGNOSIS — Z9229 Personal history of other drug therapy: Secondary | ICD-10-CM

## 2023-12-22 DIAGNOSIS — E0590 Thyrotoxicosis, unspecified without thyrotoxic crisis or storm: Secondary | ICD-10-CM | POA: Diagnosis not present

## 2023-12-22 NOTE — Progress Notes (Signed)
Outpatient Endocrinology Note [name] [place], MD  12/22/23   [name] [birth year] [id number]  Referring Provider: Laurey Morale, MD Primary Care Provider: Etta Grandchild, MD Subjective  Chief Complaint  Patient presents with   New Patient (Initial Visit)    Assessment & Plan  [name] was seen today for new patient (initial visit).  Diagnoses and all orders for this visit:  Hyperthyroidism -     T4, free -     T3, free -     TSH -     TRAb (TSH Receptor Binding Antibody) -     Thyroid stimulating immunoglobulin  H/O amiodarone therapy    [name] is currently taking methimazole 10 mg qd.  He is on amiodarone 100 mg every day for Atrial fibrillation, on amiodarone since 2022. No recent blood work done.  Discussed the etiology for hyperthyroidism. Educated on thyroid axis.  Recommend the following: Take methimazole 10 mg once a day. Repeat labs now and then in 3 months or sooner if symptoms of hyper or hypothyroidism develop.  -complications of untreated hyperthyroidism include atrial fibrillation, heart failure and osteoporosis -side effects of Methimazole include but are not limited to allergic reaction, rash, bone marrow suppression, liver dysfunction and teratogenic potential -implications in pregnancy and breastfeeding -compliance and follow up needs     I have reviewed current medications, nurse's notes, allergies, vital signs, past medical and surgical history, family medical history, and social history for this encounter. Counseled patient on symptoms, examination findings, lab findings, imaging results, treatment decisions and monitoring and prognosis. The patient understood the recommendations and agrees with the treatment plan. All questions regarding treatment plan were fully answered.   Return in about 3 months (around 03/21/2024) for visit + labs before next visit, labs today.   [name] , MD  12/22/23   I have reviewed  current medications, nurse's notes, allergies, vital signs, past medical and surgical history, family medical history, and social history for this encounter. Counseled patient on symptoms, examination findings, lab findings, imaging results, treatment decisions and monitoring and prognosis. The patient understood the recommendations and agrees with the treatment plan. All questions regarding treatment plan were fully answered.   History of Present Illness [name] is a 58 y.o. year old male who presents to our clinic with hyperthyroidism diagnosed in 07/2023.    Symptoms suggestive of HYPOTHYROIDISM:  fatigue Yes weight gain No cold intolerance  Yes constipation  No  Symptoms suggestive of HYPERTHYROIDISM:  weight loss  Yes, on mounjaro  heat intolerance No hyperdefecation  No palpitations  No has pace maker   Compressive symptoms:  dysphagia  No dysphonia  No positional dyspnea (especially with simultaneous arms elevation)  No  Smokes  Yes On biotin  No Personal history of head/neck surgery/irradiation  No  Adverse Drug Effects from Methimazole (MMI): No  Grave's Ophthalmopathy Clinical Activity Score: 0/9  Component     Latest Ref Rng 07/30/2023 08/28/2023  TSH     0.350 - 4.500 uIU/mL <0.010 (L)    Triiodothyronine,Free,Serum     2.0 - 4.4 pg/mL  3.0   T4,Free(Direct)     0.61 - 1.12 ng/dL  1.91 (H)    47/07/2955 CT CHEST, ABDOMEN AND PELVIS WITHOUT CONTRAST  Thyroid gland demonstrate no significant findings.  Physical Exam  BP 100/78   Pulse 76   Temp 97.8 F (36.6 C) (Oral)   Ht 5\' 9"  (1.753 m)   Wt (!) 319 lb (  144.7 kg)   SpO2 99%   BMI 47.11 kg/m  Constitutional: well developed, well nourished Head: normocephalic, atraumatic, no exophthalmos Eyes: sclera anicteric, no redness Neck: no thyromegaly, no thyroid tenderness; no nodules palpated Lungs: normal respiratory effort Neurology: alert and oriented, no fine hand tremor Skin: dry, no appreciable  rashes Musculoskeletal: no appreciable defects Psychiatric: normal mood and affect  Allergies Allergies  Allergen Reactions   Ace Inhibitors Anaphylaxis and Swelling    Angioedema   Isosorbide Anaphylaxis   Bidil [Isosorb Dinitrate-Hydralazine] Other (See Comments)    headache   Digoxin And Related     Unspecified "side effects"   Buspirone Other (See Comments)    dizziness    Current Medications Patient's Medications  New Prescriptions   No medications on file  Previous Medications   ALBUTEROL (VENTOLIN HFA) 108 (90 BASE) MCG/ACT INHALER    INHALE 1-2 PUFFS INTO THE LUNGS EVERY 4 (FOUR) HOURS AS NEEDED FOR SHORTNESS OF BREATH OR WHEEZING.   AMIODARONE (PACERONE) 200 MG TABLET    Take 0.5 tablets (100 mg total) by mouth daily. Patient taking once daily   ATORVASTATIN (LIPITOR) 40 MG TABLET    Take 1 tablet (40 mg total) by mouth daily.   CARVEDILOL (COREG) 6.25 MG TABLET    Take 1 tablet (6.25 mg total) by mouth 2 (two) times daily.   DAPAGLIFLOZIN PROPANEDIOL (FARXIGA) 10 MG TABS TABLET    Take 1 tablet (10 mg total) by mouth daily.   LOSARTAN (COZAAR) 25 MG TABLET    Take 0.5 tablets (12.5 mg total) by mouth at bedtime.   METHIMAZOLE (TAPAZOLE) 10 MG TABLET    Take 1 tablet (10 mg total) by mouth daily.   PANTOPRAZOLE (PROTONIX) 40 MG TABLET    TAKE 1 TABLET BY MOUTH EVERY DAY   POTASSIUM CHLORIDE SA (KLOR-CON M) 20 MEQ TABLET    Take 2 tablets (40 mEq total) by mouth daily.   PRESCRIPTION MEDICATION    Inhale into the lungs See admin instructions. Bipap, pressure 16/12 with 2L of O2 - use whenever sleeping   SILDENAFIL (VIAGRA) 50 MG TABLET    Take 50 mg by mouth as needed for erectile dysfunction.   SPIRONOLACTONE (ALDACTONE) 25 MG TABLET    Take 1 tablet (25 mg total) by mouth at bedtime.   TIRZEPATIDE (MOUNJARO) 7.5 MG/0.5ML PEN    Inject 7.5 mg into the skin once a week.   TORSEMIDE (DEMADEX) 20 MG TABLET    Take 2 tablets (40 mg total) by mouth every morning AND 1 tablet  (20 mg total) every evening. Take 40 mg by mouth every other day alternating with 20 mg every other day.   XARELTO 20 MG TABS TABLET    TAKE 1 TABLET BY MOUTH DAILY WITH SUPPER.  Modified Medications   No medications on file  Discontinued Medications   No medications on file    Past Medical History Past Medical History:  Diagnosis Date   Alcohol abuse    Anxiety state, unspecified    Atrial fibrillation (HCC)    CHF (congestive heart failure) (HCC)    Chronic systolic heart failure (HCC)    Diabetes mellitus, type II (HCC)    Edema    Gout    History of medication noncompliance    Migraine    Obesity, unspecified    Obstructive sleep apnea    Psychiatric disorder    Pulmonary embolism (HCC)    Shortness of breath     Past Surgical  History Past Surgical History:  Procedure Laterality Date   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   Procedure: BIV ICD INSERTION CRT-D;  Surgeon: Lanier Prude, MD;  Location: [facility] INVASIVE CV LAB;  Service: Cardiovascular;  Laterality: N/A;   CARDIAC CATHETERIZATION     CARDIAC CATHETERIZATION N/A 08/13/2015   Procedure: Right/Left Heart Cath and Coronary Angiography;  Surgeon: Laurey Morale, MD;  Location: [facility] INVASIVE CV LAB;  Service: Cardiovascular;  Laterality: N/A;   PACEMAKER INSERTION     RIGHT HEART CATH N/A 09/15/2023   Procedure: RIGHT HEART CATH;  Surgeon: Laurey Morale, MD;  Location: [facility] INVASIVE CV LAB;  Service: Cardiovascular;  Laterality: N/A;   RIGHT/LEFT HEART CATH AND CORONARY ANGIOGRAPHY N/A 04/14/2017   Procedure: Right/Left Heart Cath and Coronary Angiography;  Surgeon: Laurey Morale, MD;  Location: [facility] INVASIVE CV LAB;  Service: Cardiovascular;  Laterality: N/A;   TESTICLE SURGERY      Family History family history includes Cancer in his mother; Diabetes in his daughter, father, and another family member; Heart disease in his maternal grandmother; Hypertension in his mother and another family member; Stroke in  an other family member.  Social History Social History   Socioeconomic History   Marital status: Divorced    Spouse name: Not on file   Number of children: 3   Years of education: Not on file   Highest education level: Not on file  Occupational History   Occupation: DISABLED  Tobacco Use   Smoking status: Some Days    Current packs/day: 0.00    Average packs/day: 0.5 packs/day for 30.0 years (15.0 ttl pk-yrs)    Types: Cigarettes    Start date: 05/15/1990    Last attempt to quit: 05/15/2020    Years since quitting: 3.6   Smokeless tobacco: Never   Tobacco comments:    vaping - nicotine-free products  Vaping Use   Vaping status: Never Used  Substance and Sexual Activity   Alcohol use: Not Currently    Alcohol/week: 0.0 standard drinks of alcohol   Drug use: No   Sexual activity: Not Currently  Other Topics Concern   Not on file  Social History Narrative   He smokes about a pack per day and he has been smoking since he was 58 years of age.  He drinks alcohol occasionally, but he denies any illicit drug abuse.  He is presently on disability.    Lives with wife in a 2 story home.  Has 2 children.   Previously worked in Office manager, last worked in 1998.   Highest level of education:  11th grade      Lives with his daughter's mother, for right now.  Recently divorced. (2024).        Social Drivers of Health   Financial Resource Strain: Medium Risk (09/17/2023)   Overall Financial Resource Strain (CARDIA)    Difficulty of Paying Living Expenses: Somewhat hard  Food Insecurity: Food Insecurity Present (09/17/2023)   Hunger Vital Sign    Worried About Running Out of Food in the Last Year: Sometimes true    Ran Out of Food in the Last Year: Sometimes true  Transportation Needs: No Transportation Needs (09/17/2023)   PRAPARE - Administrator, Civil Service (Medical): No    Lack of Transportation (Non-Medical): No  Physical Activity: Inactive (09/17/2023)   Exercise  Vital Sign    Days of Exercise per Week: 0 days    Minutes of Exercise per Session: 0  min  Stress: Stress Concern Present (09/17/2023)   [employer] of Occupational Health - Occupational Stress Questionnaire    Feeling of Stress : Rather much  Social Connections: Socially Isolated (09/17/2023)   Social Connection and Isolation Panel [NHANES]    Frequency of Communication with Friends and Family: More than three times a week    Frequency of Social Gatherings with Friends and Family: Never    Attends Religious Services: Never    Database administrator or Organizations: No    Attends Banker Meetings: Never    Marital Status: Divorced  Catering manager Violence: Not At Risk (11/24/2023)   Received from [facility]   HITS    Over the last 12 months how often did your partner physically hurt you?: Never    Over the last 12 months how often did your partner insult you or talk down to you?: Never    Over the last 12 months how often did your partner threaten you with physical harm?: Never    Over the last 12 months how often did your partner scream or curse at you?: Never    Laboratory Investigations Lab Results  Component Value Date   TSH <0.010 (L) 07/30/2023   TSH 2.98 02/05/2023   TSH 4.323 10/10/2022   FREET4 1.54 (H) 08/28/2023     No results found for: "TSI"   No components found for: "TRAB"   Lab Results  Component Value Date   CHOL 140 12/16/2023   Lab Results  Component Value Date   HDL 35 (L) 12/16/2023   Lab Results  Component Value Date   LDLCALC 83 12/16/2023   Lab Results  Component Value Date   TRIG 123 12/16/2023   Lab Results  Component Value Date   CHOLHDL 4.0 12/16/2023   Lab Results  Component Value Date   CREATININE 1.61 (H) 12/16/2023   Lab Results  Component Value Date   GFR 40.56 (L) 02/06/2023      Component Value Date/Time   NA 141 12/16/2023 1012   K 3.7 12/16/2023 1012   CL 101 12/16/2023 1012   CO2 28  12/16/2023 1012   GLUCOSE 109 (H) 12/16/2023 1012   BUN 20 12/16/2023 1012   CREATININE 1.61 (H) 12/16/2023 1012   CALCIUM 9.2 12/16/2023 1012   PROT 7.5 12/16/2023 1012   ALBUMIN 3.1 (L) 09/29/2023 0938   AST 9 (L) 12/16/2023 1012   ALT 7 (L) 12/16/2023 1012   ALKPHOS 109 09/29/2023 0938   BILITOT 0.8 12/16/2023 1012   GFRNONAA 57 (L) 09/29/2023 0938   GFRAA >60 05/15/2020 1452      Latest Ref Rng & Units 12/16/2023   10:12 AM 09/29/2023    9:38 AM 09/15/2023    2:51 PM  BMP  Glucose 65 - 99 mg/dL 952  95    BUN 7 - 25 mg/dL 20  16    Creatinine 8.41 - 1.30 mg/dL 3.24  4.01    BUN/Creat Ratio 6 - 22 (calc) 12     Sodium 135 - 146 mmol/L 141  139  138    138   Potassium 3.5 - 5.3 mmol/L 3.7  3.6  3.5    3.6   Chloride 98 - 110 mmol/L 101  108    CO2 20 - 32 mmol/L 28  24    Calcium 8.6 - 10.3 mg/dL 9.2  8.8         Component Value Date/Time   WBC 8.6 09/29/2023  0938   RBC 5.37 09/29/2023 0938   HGB 15.8 09/29/2023 0938   HCT 44.1 09/29/2023 0938   PLT 255 09/29/2023 0938   MCV 82.1 09/29/2023 0938   MCH 29.4 09/29/2023 0938   MCHC 35.8 09/29/2023 0938   RDW 14.6 09/29/2023 0938   LYMPHSABS 3.3 09/29/2023 0938   MONOABS 0.6 09/29/2023 0938   EOSABS 0.9 (H) 09/29/2023 0938   BASOSABS 0.1 09/29/2023 0938      Parts of this note may have been dictated using voice recognition software. There may be variances in spelling and vocabulary which are unintentional. Not all errors are proofread. Please notify the Thereasa Parkin if any discrepancies are noted or if the meaning of any statement is not clear.

## 2023-12-22 NOTE — Patient Instructions (Signed)
If you notice any symptoms of worsening fatigue, fever with sore throat, loss of appetite, yellowing of eyes, dark urine, joint pains, sores in the mouth, itchy rash, light colored stools or abdominal pain, please stop the medication and call us immediately as this can be a serious side effect of the medication.

## 2023-12-23 ENCOUNTER — Other Ambulatory Visit: Payer: Self-pay | Admitting: "Endocrinology

## 2023-12-23 ENCOUNTER — Telehealth: Payer: Self-pay

## 2023-12-23 DIAGNOSIS — Z9229 Personal history of other drug therapy: Secondary | ICD-10-CM

## 2023-12-23 DIAGNOSIS — E0590 Thyrotoxicosis, unspecified without thyrotoxic crisis or storm: Secondary | ICD-10-CM

## 2023-12-23 NOTE — Telephone Encounter (Signed)
Patient given medication changes as directed by MD. No further questions at this time.

## 2023-12-23 NOTE — Telephone Encounter (Signed)
-----   Message from [facility] sent at 12/23/2023 12:40 PM EST ----- Please let the patient know that he needs to cut down the methimazole to 5 mg once a day.  If he has 10 mg pills, he can take half of it every day.  I am also going to send him more 5 mg pills that he just needs to take 1 a day.  Thanks

## 2023-12-24 MED ORDER — MOUNJARO 7.5 MG/0.5ML ~~LOC~~ SOAJ: 7.5000 mg | SUBCUTANEOUS | 0 refills | Status: DC

## 2023-12-24 NOTE — Telephone Encounter (Signed)
Spoke with patient.  Noticed he was seen in the ER out of town for diarrhea and abdominal pain.  He states that on CT they saw an aneurysm.  He is seeing Dr. Shirlee Latch next week and I advised that he make him aware so that he can get referred to the appropriate people for monitoring.  His abdominal pain and diarrhea has resolved.  Currently on Mounjaro 7.5 mg.  We discussed staying at this dose for at least another month.  Will send in prescription and follow-up in another month.

## 2023-12-24 NOTE — Addendum Note (Signed)
Addended by: Malena Peer D on: 12/24/2023 02:04 PM   Modules accepted: Orders

## 2023-12-25 LAB — T4, FREE: Free T4: 1.1 ng/dL (ref 0.8–1.8)

## 2023-12-25 LAB — THYROID STIMULATING IMMUNOGLOBULIN: TSI: 102 %{baseline}

## 2023-12-25 LAB — TRAB (TSH RECEPTOR BINDING ANTIBODY): TRAB: <1 IU/L

## 2023-12-25 LAB — TSH: TSH: 12.49 m[IU]/L — ABNORMAL HIGH (ref 0.40–4.50)

## 2023-12-25 LAB — T3, FREE: T3, Free: 2.8 pg/mL (ref 2.3–4.2)

## 2023-12-28 DIAGNOSIS — G4733 Obstructive sleep apnea (adult) (pediatric): Secondary | ICD-10-CM | POA: Diagnosis not present

## 2023-12-29 ENCOUNTER — Ambulatory Visit (HOSPITAL_COMMUNITY)
Admission: RE | Admit: 2023-12-29 | Discharge: 2023-12-29 | Disposition: A | Discharge status: Home / Self Care | Payer: Medicare HMO | Source: Ambulatory Visit | Attending: Cardiology | Admitting: Cardiology

## 2023-12-29 ENCOUNTER — Encounter (HOSPITAL_COMMUNITY): Payer: Self-pay | Admitting: Cardiology

## 2023-12-29 ENCOUNTER — Telehealth: Payer: Self-pay | Admitting: Pharmacist

## 2023-12-29 VITALS — BP 122/83 | HR 76 | Wt 311.0 lb

## 2023-12-29 DIAGNOSIS — I4901 Ventricular fibrillation: Secondary | ICD-10-CM | POA: Diagnosis not present

## 2023-12-29 DIAGNOSIS — Z79899 Other long term (current) drug therapy: Secondary | ICD-10-CM | POA: Diagnosis not present

## 2023-12-29 DIAGNOSIS — I480 Paroxysmal atrial fibrillation: Secondary | ICD-10-CM | POA: Diagnosis not present

## 2023-12-29 DIAGNOSIS — G4733 Obstructive sleep apnea (adult) (pediatric): Secondary | ICD-10-CM | POA: Diagnosis not present

## 2023-12-29 DIAGNOSIS — I428 Other cardiomyopathies: Secondary | ICD-10-CM | POA: Insufficient documentation

## 2023-12-29 DIAGNOSIS — F419 Anxiety disorder, unspecified: Secondary | ICD-10-CM | POA: Diagnosis not present

## 2023-12-29 DIAGNOSIS — Z86711 Personal history of pulmonary embolism: Secondary | ICD-10-CM | POA: Insufficient documentation

## 2023-12-29 DIAGNOSIS — Z7901 Long term (current) use of anticoagulants: Secondary | ICD-10-CM | POA: Diagnosis not present

## 2023-12-29 DIAGNOSIS — Z7984 Long term (current) use of oral hypoglycemic drugs: Secondary | ICD-10-CM | POA: Insufficient documentation

## 2023-12-29 DIAGNOSIS — I5022 Chronic systolic (congestive) heart failure: Secondary | ICD-10-CM | POA: Diagnosis not present

## 2023-12-29 DIAGNOSIS — D509 Iron deficiency anemia, unspecified: Secondary | ICD-10-CM | POA: Insufficient documentation

## 2023-12-29 DIAGNOSIS — F17210 Nicotine dependence, cigarettes, uncomplicated: Secondary | ICD-10-CM | POA: Diagnosis not present

## 2023-12-29 DIAGNOSIS — M25572 Pain in left ankle and joints of left foot: Secondary | ICD-10-CM | POA: Insufficient documentation

## 2023-12-29 DIAGNOSIS — Z6841 Body Mass Index (BMI) 40.0 and over, adult: Secondary | ICD-10-CM | POA: Diagnosis not present

## 2023-12-29 DIAGNOSIS — E785 Hyperlipidemia, unspecified: Secondary | ICD-10-CM | POA: Diagnosis not present

## 2023-12-29 DIAGNOSIS — E0590 Thyrotoxicosis, unspecified without thyrotoxic crisis or storm: Secondary | ICD-10-CM | POA: Diagnosis not present

## 2023-12-29 DIAGNOSIS — I110 Hypertensive heart disease with heart failure: Secondary | ICD-10-CM | POA: Insufficient documentation

## 2023-12-29 DIAGNOSIS — E119 Type 2 diabetes mellitus without complications: Secondary | ICD-10-CM | POA: Diagnosis not present

## 2023-12-29 DIAGNOSIS — R0602 Shortness of breath: Secondary | ICD-10-CM | POA: Diagnosis not present

## 2023-12-29 DIAGNOSIS — F410 Panic disorder [episodic paroxysmal anxiety] without agoraphobia: Secondary | ICD-10-CM | POA: Insufficient documentation

## 2023-12-29 DIAGNOSIS — F32A Depression, unspecified: Secondary | ICD-10-CM | POA: Insufficient documentation

## 2023-12-29 DIAGNOSIS — R109 Unspecified abdominal pain: Secondary | ICD-10-CM | POA: Insufficient documentation

## 2023-12-29 DIAGNOSIS — I472 Ventricular tachycardia, unspecified: Secondary | ICD-10-CM | POA: Insufficient documentation

## 2023-12-29 LAB — CBC
HCT: 43.5 % (ref 39.0–52.0)
Hemoglobin: 15.7 g/dL (ref 13.0–17.0)
MCH: 29.5 pg (ref 26.0–34.0)
MCHC: 36.1 g/dL — ABNORMAL HIGH (ref 30.0–36.0)
MCV: 81.8 fL (ref 80.0–100.0)
Platelets: 262 10*3/uL (ref 150–400)
RBC: 5.32 MIL/uL (ref 4.22–5.81)
RDW: 13.9 % (ref 11.5–15.5)
WBC: 6.7 10*3/uL (ref 4.0–10.5)
nRBC: 0 % (ref 0.0–0.2)

## 2023-12-29 LAB — COMPREHENSIVE METABOLIC PANEL
ALT: 10 U/L (ref 0–44)
AST: 17 U/L (ref 15–41)
Albumin: 3.6 g/dL (ref 3.5–5.0)
Alkaline Phosphatase: 108 U/L (ref 38–126)
Anion gap: 17 — ABNORMAL HIGH (ref 5–15)
BUN: 15 mg/dL (ref 6–20)
CO2: 25 mmol/L (ref 22–32)
Calcium: 9.1 mg/dL (ref 8.9–10.3)
Chloride: 100 mmol/L (ref 98–111)
Creatinine, Ser: 1.79 mg/dL — ABNORMAL HIGH (ref 0.61–1.24)
GFR, Estimated: 44 mL/min — ABNORMAL LOW (ref >60)
Glucose, Bld: 80 mg/dL (ref 70–99)
Potassium: 3 mmol/L — ABNORMAL LOW (ref 3.5–5.1)
Sodium: 142 mmol/L (ref 135–145)
Total Bilirubin: 1.4 mg/dL — ABNORMAL HIGH (ref 0.0–1.2)
Total Protein: 8 g/dL (ref 6.5–8.1)

## 2023-12-29 LAB — PREALBUMIN: Prealbumin: 22 mg/dL (ref 18–38)

## 2023-12-29 LAB — BRAIN NATRIURETIC PEPTIDE: B Natriuretic Peptide: 255.4 pg/mL — ABNORMAL HIGH (ref 0.0–100.0)

## 2023-12-29 NOTE — Progress Notes (Addendum)
Patient presents for 2 month follow up in [facility] Clinic today. VAD evaluation signed 09/15/23 and pt requested second opinion at [facility]. Pt seen by Dr. Edwena Blow and deemed not a transplant candidate at this time due to his weight. Pt has previously declined VAD therapy.   Pt ambulated independently using a cane into clinic this morning accompanied by his mother and ex wife. Pt denies lightheadedness, dizziness, falls, shortness of breath, and signs of bleeding. He was hospitalized in [facility] 12/04/23 with severe abdominal pain with diarrhea. Work up completed and believed to likely be a viral illness. Mounjaro dose increase to 7.5mg  prior to hospitalization. Dr.McLean advises to reduce dose and increase proper nutrition by supplementing with Ensure. VAD Coordinator spoke to Malena Peer [facility] who manages pt's GLP-1 and plan is to hold [facility] for 2 weeks. RPH will then resume at lower dose if appropriate.   EKG obtained today pt AV paced at 73 bpm.  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 Reviewed notes with Dr. Shirlee Latch in clinic. No treatment needed at this time.   Discussion held about VAD therapy again. Pt now open to considering VAD as an option. Evaluation started in October. Pt will need to meet with Dr. Laneta Simmers and complete his social evaluation to complete. Pt identified his mother as his primary caregiver if this therapy should be deemed appropriate for him. Explained need for 24/7 care when pt is discharged home due to sternal precautions, adaptation to living on support, emotional support, consistent and meticulous exit site care and management, medication adherence and high volume of follow up visits with the VAD Clinic after discharge; both pt and caregiver verbalized understanding of above.   Vital Signs:  HR: 76 BP: 122/83 (95) SPO2: 97 %   Weight: 311 lb  Last weight: 319 lb  Symptom YES NO DETAILS  Angina  X Activity:  Claudication  X How Far:  Syncope  X When:   Stroke  X   Orthopnea  X How many pillows:  PND   How often:  CPAP   How many hours:  Pedal Edema  X   Abdominal Fullness  X   Nausea / Vomit X  Daily with food  Diaphoresis  X When:  Shortness of Breath  X Activity:  Palpitations  X When:  ICD shock     Bleeding S/S  X   Tea-colored Urine     Hospitalizations     Emergency Room X  11/22 ankle pain- xrays obtained no fracture- placed in boot and referred to orthopedics   Other MD   10/05/23- Surgical evaluation with Dr. Maren Beach 12/22/23- Endocrinology- decrease Methimazole to 5mg  daily 12/04/23 Psych- start Melatonin for sleep  Activity   Fluid   Diet    Addendum:  Pt's K+ came back at 3.0. Pt currently take as needed. Discussed with Dr. Shirlee Latch. Pt to increase to 40 meq daily (2 tablets). Detailed VM left with instructions.   Device: Abbott Therapies: ON VF 222 Last check: 10/26/23  Patient Instructions:   Reduce Mounjaro dose to 5mg  weekly; Continue to follow with Malena Peer [facility] for titration  Return to VAD Clinic in 3 weeks for social eval and follow up with Dr. Shirlee Latch Call PCP in regard to ankle pain Continue to take Torsemide 40mg  daily   Simmie Davies RN,BSN VAD Coordinator  Office: [phone] 24/7 Emergency VAD Pager: [phone]

## 2023-12-29 NOTE — Patient Instructions (Addendum)
Await call from Margaretmary Dys [facility] about Mounjaro dose Return to VAD Clinic in 3 weeks for social eval and follow up with Dr. Shirlee Latch Call PCP in regard to ankle pain Continue to take Torsemide 40mg  daily

## 2023-12-29 NOTE — Telephone Encounter (Signed)
Spoke with patient. He is having diarrhea, nausea, stomach pain, cannot eat much at all. Throws most of it up. Will Hold Mounjaro. His CT scan from 1/3 did show some gallstones (which GLP-1 does increase the risk of) but without acute cholecystis.   I asked him to stop the [facility] for 2 weeks. I will call him at that point and we can re-evaluate and could resume at 2.5mg .

## 2023-12-29 NOTE — Progress Notes (Signed)
Advanced Heart Failure Clinic Note   Primary Care: Dr. Sanda Linger HF Cardiologist: Dr. Shirlee Latch   Chief complaint: Shortness of breath  HPI: Mr. Antrim is a 58 y.o. male with a past medical history of NICM, EF 25-30% in March 2018, felt to be related to prior ETOH abuse. He also has a history of PE 03/2014 completed a years course of Xarelto, morbid obesity, OSA, and tobacco abuse.    He was admitted 11/12/15-11/14/15 with acute on chronic systolic CHF and palpitations. He wore a 30 day event monitor at discharge as he had frequent PVC's and questionable Afib on telemetry. Also with some NSVT, so he was started on Amiodarone, but at follow up had not started taking it. He had previously refused ICD and was seen inpatient by Dr. Johney Frame who felt that his morbid obesity was a prohibitive factor.    He was seen in the clinic in April 2018. He had started drinking ETOH again. Volume status was stable, he is not an Entresto candidate due to angioedema with lisinopril. Weight was 411 pounds.    Admitted 04/12/17 with SOB, chest pain. D- dimer was 1.16, chest CT without central obstructing PE, however more peripheral and subsegmental pulmonary artery branches were not confidently evaluated due to his body habitus. He was started on a heparin gtt for presumed PE, however VQ scan showed no PE. Troponin was elevated, peaked at 3.37. LHC showed no CAD. Echo showed an EF of 15%, grade 2 DD, no pericarditis. He was diuresed with IV lasix, and started on torsemide 20mg  at discharge. Discharge weight was 405 pounds.   Admitted 5/22 through 04/24/2018 with abdominal pain, nausea, and vomiting. 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 Did not require surgical intervention. Followed by outpatient GI.   Echo in 2/21 with EF 20-25%, severe LV dilation.   Presented 05/09/21 w/ SOB 2/2 acute CHF w ? PNA.  CTA showed multifocal ?PNA, no PE. Also in Afib w/ RVR in 150s on admit. Initially required bipap, developed hemoptysis and  intubated. Treated w/ IV Lasix. Echo 05/10/21: LVEF < 20%. RV moderately reduced. Improved and was extubated 6/13. Went into Afib w/ RVR. Refused to wear BiPAP. Developed severe agitation/hypoxia and re-intubated.  Went into VT>>VF arrest, treated w/ ACLS>>ROSC after . Developed refractory AFib again next morning requiring emergent cardioversion 05/16/21. He was transitioned to PO amiodarone. HR remained in 40-50's, metoprolol and sildenafil were stopped. Discharge weight 367 lbs.  St Jude CRT-D device placed in 2/23.  Echo 8/23 showed EF <20%, mild MR, RV normal.  CPX in 9/23 showed moderate functional limitation due to HF and body habitus.   Echo in 9/24 showed EF <20% with severe LV dilation, normal RV size/function, no significant MR.   CPX was done in 10/24, this showed only mild to moderate HF limitation.  However, RHC in 10/24 showed mildly elevated filling pressures and low cardiac index (1.69 Fick, 2.06 Thermo).    Patient returns today for followup of CHF.  He continues to lose weight with Mounjaro, down another 6 lbs.  However, he has had nausea/vomiting, GI upset, and poor appetite while on Mounjaro.  He has left ankle pain which is limiting is walking currently, plain films have shown arthritis.  No dyspnea walking around the house but gets fatigued and short of breath with longer distances.  He stopped losartan due to lightheadedness.  He is followed by endocrinology for hyperthyroidism.  TSH now elevated and he has cut back on methimazole.  We have been  discussing advanced therapies and he is here with his family today to think about LVAD placement.  He saw Dr. Edwena Blow at [facility] who agreed with me that he is not a transplant candidate due to weight but would be a LVAD candidate.     ECG (personally reviewed): A-BiV pacing  Labs (1/19): K 3.8 Creatinine 1.25  Labs (2/19): K 3.8 Creatinine 1.08 Labs (4/19): K 4.1 creatinine 1/15 BNP 212  Labs (5/19): creatinine 1.1  Labs (10/19): LDL 166,  K 3.8, creatinine 1.01, AST 102 => 25, ALT 125 => 37, alkaline phosphatase 233 Labs (11/20): LDL 106, TGs 232 Labs (2/21): K 3.3, creatinine 1.3 Labs (4/21): LDL 114, TGs 466, K 3.6, creatinine 1.22 Labs (12/21): K 3.5, creatinine 1.08, LDL 164, TGs 133 Labs (722): K 3.8, creatinine 1.4 Labs (8/22): K 3.8, creatinine 1.23 Labs (9/22): K 3.6, creatinine 1.26 Labs (2/23): LFTs normal, TSH normal, K 4, creatinine 1.3 Labs (8/23): K 3.4, creatinine 1.49 Labs (10/23): K 3.8, creatinine 1.72 Labs (11/23): LFTs normal, TSH normal Labs (1/24): K 3.9, creatinine 1.53 Labs (3/24): K 4.2, creatinine 1.86, LDL 65 Labs (7/24): K 3.5, creatinine 1.10, BNP 241, Hgb 14.5, Hgb A1c 5.5  Labs (8/24): transferrin saturation 22%, BNP 168, TSH < 0.01, K 3.2, creatinine 1.62, LFTs normal Labs (1/25): TSH 12.49, LDL 83, K 3.7, creatinine 1.6  PMH: 1. Nonischemic cardiomyopathy: Prior cath with no significant CAD.  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  Prior echoes with EF as low as 25%.  Echo (9/13) with EF 35-40%, moderate to severe LV dilation, diffuse hypokinesis, mild MR. Echo (5/15) with EF 30-35%, moderate to severe LAE, normal RV size and systolic function.  Angioedema with ACEI, headaches with hydralazine/nitrates. Echo (3/16) with EF 25-30%, severe LV dilation, normal RV size and systolic function.  [facility] 08/13/15 showed no significant coronary disease; RA mean 6, PA 33/11 mean 23, PCWP mean 13, Fick CO/CI 4.75 /1.68 (difficult study, radial artery spasm, if needs future cath would use groin).  Echo (9/16) showed EF 20-25%.   - Echo (3/18): EF 25-30%, moderate LAE - Echo 4/18 EF 15% - Echo 7/19 EF 20-25% - Echo 2/21 with EF 20-25%, severe LV dilation.  - Echo 6/22 with EF < 20%, normal RV. - St Jude CRT-D 2/23.  - Echo (8/23): EF < 20%, mild MR, RV normal.  - CPX (9/23): Peak VO2 13.1, VE/VCO2 slope 37, RER 1.10.  Moderate functional limitation, body habitus  and HF.  - Echo (9/24): EF <20% with severe LV dilation, normal RV size/function, no significant MR.  - CPX (10/24): peak VO2 14.5, VE/VCO2 slope 33, RER 1.06.  Mild-moderate HF limitation.  - RHC (10/24): mean RA 9, PA 38/15 mean 29, mean PCWP 22, CI 1.69 Fick/2.06 thermo, PAPi 2.5.  2. HTN: angioedema with ACEI.  3. OSA: on Bipap 4. Morbid obesity 5. Paroxysmal atrial fibrillation: Urgent DCCV 6/22. 6. Smoker.  7. Anxiety/panic attacks 8. PE: 5/15, diagnosed by V/Q scan. CTA chest 8/16 negative for PE.  9. NSVT, PVCs: 30 day monitor (12/16) with PVCs, PACs, no atrial fibrillation.  - Zio patch (9/19): few short NSVT runs, no atrial fibrillation, 1.1% PVCs 10. Hematuria: Apparently had negative workup by urology.  11. ABIs (6/16) were normal 12. 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  13. Gout 14. Low back pain.  15. 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 CKD stage 3 17. Type 2 diabetes 18. 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  SH: Separated, lives in [place], drinks ETOH occasionally, no drugs. Prior smoker.  Has son and daughter.     FH: No premature CAD.    Review of systems complete and found to be negative unless listed in HPI.   Current Outpatient Medications  Medication Sig Dispense Refill   albuterol (VENTOLIN HFA) 108 (90 Base) MCG/ACT inhaler INHALE 1-2 PUFFS INTO THE LUNGS EVERY 4 (FOUR) HOURS AS NEEDED FOR SHORTNESS OF BREATH OR WHEEZING. 8.5 each 1   amiodarone (PACERONE) 200 MG tablet Take 0.5 tablets (100 mg total) by mouth daily. Patient taking once daily 90 tablet 1   atorvastatin (LIPITOR) 40 MG tablet Take 1 tablet (40 mg total) by mouth daily. 90 tablet 3   carvedilol (COREG) 6.25 MG tablet Take 1 tablet (6.25 mg total) by mouth 2 (two) times daily. 60 tablet 11   dapagliflozin propanediol (FARXIGA) 10 MG TABS tablet Take 1 tablet (10 mg total) by mouth daily. 90 tablet 1   methimazole (TAPAZOLE) 10 MG tablet Take 1 tablet (10 mg total) by mouth daily. (Patient taking  differently: Take 5 mg by mouth daily.) 90 tablet 3   pantoprazole (PROTONIX) 40 MG tablet TAKE 1 TABLET BY MOUTH EVERY DAY 90 tablet 0   potassium chloride SA (KLOR-CON M) 20 MEQ tablet Take 2 tablets (40 mEq total) by mouth daily. (Patient taking differently: Take 20 mEq by mouth as needed.) 180 tablet 3   sildenafil (VIAGRA) 50 MG tablet Take 50 mg by mouth as needed for erectile dysfunction.     spironolactone (ALDACTONE) 25 MG tablet Take 1 tablet (25 mg total) by mouth at bedtime. 90 tablet 3   tirzepatide (MOUNJARO) 7.5 MG/0.5ML Pen Inject 7.5 mg into the skin once a week. 2 mL 0   torsemide (DEMADEX) 20 MG tablet Take 2 tablets (40 mg total) by mouth every morning AND 1 tablet (20 mg total) every evening. Take 40 mg by mouth every other day alternating with 20 mg every other day. 60 tablet 6   XARELTO 20 MG TABS tablet TAKE 1 TABLET BY MOUTH DAILY WITH SUPPER. 90 tablet 0   losartan (COZAAR) 25 MG tablet Take 0.5 tablets (12.5 mg total) by mouth at bedtime. (Patient not taking: Reported on 12/22/2023) 15 tablet 3   PRESCRIPTION MEDICATION Inhale into the lungs See admin instructions. Bipap, pressure 16/12 with 2L of O2 - use whenever sleeping     No current facility-administered medications for this encounter.   Allergies  Allergen Reactions   Ace Inhibitors Anaphylaxis and Swelling    Angioedema   Isosorbide Anaphylaxis   Bidil [Isosorb Dinitrate-Hydralazine] Other (See Comments)    headache   Digoxin And Related     Unspecified "side effects"   Buspirone Other (See Comments)    dizziness   Social History   Socioeconomic History   Marital status: Divorced    Spouse name: Not on file   Number of children: 3   Years of education: Not on file   Highest education level: Not on file  Occupational History   Occupation: DISABLED  Tobacco Use   Smoking status: Some Days    Current packs/day: 0.00    Average packs/day: 0.5 packs/day for 30.0 years (15.0 ttl pk-yrs)    Types:  Cigarettes    Start date: 05/15/1990    Last attempt to quit: 05/15/2020    Years since quitting: 3.6   Smokeless tobacco: Never   Tobacco comments:    vaping - nicotine-free products  Vaping Use   Vaping status: Never Used  Substance and Sexual Activity   Alcohol use: Not Currently    Alcohol/week: 0.0 standard drinks of alcohol   Drug use: No   Sexual activity: Not Currently  Other Topics Concern   Not on file  Social History Narrative   He smokes about a pack per day and he has been smoking since he was 58 years of age.  He drinks alcohol occasionally, but he denies any illicit drug abuse.  He is presently on disability.    Lives with wife in a 2 story home.  Has 2 children.   Previously worked in Office manager, last worked in 1998.   Highest level of education:  11th grade      Lives with his daughter's mother, for right now.  Recently divorced. (2024).        Social Drivers of Health   Financial Resource Strain: Medium Risk (09/17/2023)   Overall Financial Resource Strain (CARDIA)    Difficulty of Paying Living Expenses: Somewhat hard  Food Insecurity: Food Insecurity Present (09/17/2023)   Hunger Vital Sign    Worried About Running Out of Food in the Last Year: Sometimes true    Ran Out of Food in the Last Year: Sometimes true  Transportation Needs: No Transportation Needs (09/17/2023)   PRAPARE - Administrator, Civil Service (Medical): No    Lack of Transportation (Non-Medical): No  Physical Activity: Inactive (09/17/2023)   Exercise Vital Sign    Days of Exercise per Week: 0 days    Minutes of Exercise per Session: 0 min  Stress: Stress Concern Present (09/17/2023)   [employer] of Occupational Health - Occupational Stress Questionnaire    Feeling of Stress : Rather much  Social Connections: Socially Isolated (09/17/2023)   Social Connection and Isolation Panel [NHANES]    Frequency of Communication with Friends and Family: More than three times a  week    Frequency of Social Gatherings with Friends and Family: Never    Attends Religious Services: Never    Database administrator or Organizations: No    Attends Banker Meetings: Never    Marital Status: Divorced  Catering manager Violence: Not At Risk (11/24/2023)   Received from [facility]   HITS    Over the last 12 months how often did your partner physically hurt you?: Never    Over the last 12 months how often did your partner insult you or talk down to you?: Never    Over the last 12 months how often did your partner threaten you with physical harm?: Never    Over the last 12 months how often did your partner scream or curse at you?: Never   Family History  Problem Relation Age of Onset   Cancer Mother        brain tumor   Hypertension Mother    Diabetes Father        Deceased, 80   Heart disease Maternal Grandmother    Hypertension Other        Family History   Stroke Other        Family History   Diabetes Other        Family History   Diabetes Daughter    BP 122/83 Comment: 95  Pulse 76   Wt (!) 141.1 kg (311 lb)   SpO2 97%   BMI 45.93 kg/m   Wt Readings from Last 3 Encounters:  12/29/23 (!) 141.1  kg (311 lb)  12/22/23 (!) 144.7 kg (319 lb)  10/23/23 (!) 142 kg (313 lb)   PHYSICAL EXAM: General: NAD Neck: No JVD, no thyromegaly or thyroid nodule.  Lungs: Clear to auscultation bilaterally with normal respiratory effort. CV: Nondisplaced PMI.  Heart regular S1/S2, no S3/S4, no murmur.  No peripheral edema.  No carotid bruit.  Normal pedal pulses.  Abdomen: Soft, nontender, no hepatosplenomegaly, no distention.  Skin: Intact without lesions or rashes.  Neurologic: Alert and oriented x 3.  Psych: Normal affect. Extremities: No clubbing or cyanosis.  HEENT: Normal.   ASSESSMENT & PLAN: 1. Atrial fibrillation: Paroyxsmal. Emergent DCCV in 6/22. He is in NSR today. - Continue amiodarone 100 mg daily. Check LFTs. Will need regular eye exam.   Seeing endocrinology for hyperthyroidism, has been on methimazole and now TSH high (cutting back methimazole).  - Continue Xarelto. CBC today.  2.  Chronic systolic CHF: Nonischemic cardiomyopathy. ?If ETOH has played a role (now drinks rarely). Echo (3/18) with EF 25-30%. Echo 7/19 and in 2/21 with EF 20-25%. Echo 6/22 with EF < 20%, normal RV.  Has St Jude CRT-D device now.  Echo-post CRT (8/23) showed EF < 20%, mild MR, RV normal.  CPX in 9/23 with moderate functional limitation due to HF and body habitus. Echo in 9/24 showed  EF <20% with severe LV dilation, normal RV size/function, no significant MR.  CPX in 10/24 showed mild to moderate HF limitation but RHC in 10/24 showed low output (1.69 thermo/2.05 Fick).  Medication titration has been limited by hypotension.  NYHA class III symptoms.  - Continue torsemide 40 mg daily. BMET/BNP today.  - Unable to tolerate losartan due to low BP (off).   - Continue Coreg 6.25 mg bid. BP will not tolerate titration.   - Continue spironolactone 25 mg daily. - Continue Farxiga 10 mg daily. - No Entresto, ACEI with h/o angioedema.  - Headaches with Bidil, cannot take.  - He did not tolerate digoxin.   - Insurance denied baroreceptor activation therapy.  - In the past, too heavy to consider LVAD or transplant.  He has lost a lot of weight, now down to 311 lbs (was > 400 lbs).  He saw Dr Edwena Blow at [facility], not transplant candidate as BMI is still too high and still smoking but thought he was reasonable for LVAD.  I worry that he is nearing end-stage HF with low output on RHC and difficulty titrating meds. Currently, he seems to be in a window where LVAD would be feasible.  With some worsening of renal function, we may not have a wide window.  He comes in with his family today to discuss LVAD.  He is interested in pursuing at this time, mother would be his caregiver.  I will have him see Dr. Laneta Simmers, meet an LVAD patient, and see our social worker.  3. HTN: BP now runs  low, limits GDMT.  4. 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  Has St Jude CRT-D device now. On amiodarone. No VT on device interrogation  - continue amiodarone 100 mg daily. See discussion above regarding thyroid labs. 5. OSA: reports full compliance w/ BiPAP.  - Continue nightly. 6. PE: 04/14/2018, diagnosed by V/Q scan. Has been on Xarelto. No bleeding issues. 7. Anxiety/Panic attacks/depression: Followed by psychiatry. 8. Hyperlipidemia:  LDL 65 (3/24) - Continue atorvastatin. 9. Obesity: Body mass index is 45.93 kg/m. He is now on [place]. Has lost >100 lb so far. With nausea/abdominal pain/poor appetite, I  worry about him becoming malnourished.  - Check prealbumin.  - I will discuss with pharmacy clinic either cutting back Mounjaro or stopping it for now.  10. Tobacco use: Back smoking. Discussed cessation. 11. Iron Deficiency Anemia: received dose of Feraheme 8/24 12. T2DM  - on Mounjaro and Farxiga 13. Hyperthyroidism: Suspect related to amiodarone.  He is now on methimazole and TSH is elevated.  - Followed by endocrinology and cutting back on methimazole.   Followup 3 wks.   Marca Ancona  12/29/2023

## 2023-12-30 ENCOUNTER — Telehealth (HOSPITAL_COMMUNITY): Payer: Self-pay

## 2023-12-30 NOTE — Telephone Encounter (Signed)
Pt called this morning and confirmed understanding of instructions to take of Potassium daily per Dr. Shirlee Latch. Repeat BMET scheduled 01/12/24 at 11:00.  Simmie Davies RN, BSN VAD Coordinator 24/7 Pager [phone]

## 2024-01-08 ENCOUNTER — Other Ambulatory Visit (HOSPITAL_COMMUNITY): Payer: Self-pay | Admitting: Unknown Physician Specialty

## 2024-01-08 DIAGNOSIS — I5022 Chronic systolic (congestive) heart failure: Secondary | ICD-10-CM

## 2024-01-11 ENCOUNTER — Ambulatory Visit: Payer: Medicare HMO | Attending: Cardiology

## 2024-01-11 DIAGNOSIS — Z95810 Presence of automatic (implantable) cardiac defibrillator: Secondary | ICD-10-CM

## 2024-01-11 DIAGNOSIS — I5022 Chronic systolic (congestive) heart failure: Secondary | ICD-10-CM | POA: Diagnosis not present

## 2024-01-12 ENCOUNTER — Other Ambulatory Visit (HOSPITAL_COMMUNITY): Payer: Medicare HMO

## 2024-01-12 NOTE — Progress Notes (Addendum)
EPIC Encounter for ICM Monitoring  Patient Name: [name] is a 58 y.o. male Date: 01/12/2024 Primary Care Physican: Etta Grandchild, MD Primary Cardiologist: Shirlee Latch Electrophysiologist: Townsend Roger Pacing: 959% 02/04/2023 Weight:  358 lbs       06/01/2023 Weight: 328 lbs (losing weight taking Mounjaro)   06/09/2023 Weight: 328 lbs             07/07/2023 Weight: 308 lbs   9/10/202 Weight: 308 lbs      09/16/2023 Weight: 317 lbs    12/29/2023 Office Weight: 311 lbs   Spoke with patient and heart failure questions reviewed.  Transmission results reviewed.  Pt reports feeling some SOB and fullness in his chest in the last 10 days.     Diet:  He reports 2/11 eating foods high in salt over the last couple of weeks.  CorVue thoracic impedance suggesting possible fluid accumulation starting 1/31 and returned close to baseline 2/9.     Prescribed:  Torsemide 20 mg Take 2 tablet(s) (40 mg total) every morning and 1 tablet (20 mg total) every afternoon.  01/12/2024 Pt reports he is only taking Torsemide 40 mg in the morning and no afternoon dose.  Potassium 20 mEq take 2 tablet(s) (40 mEq total) by mouth daily.  2/11 Confirmed he is taking Potassium every day as prescribed.  Spironolactone 25 mg take 1 tablet daily   Labs: 01/18/2024 BMET Scheduled at HF clinic.  Pt missed 2/11 lab appointment but agreed to have BMET drawn on 2/17.  Discussed the importance of rechecking labs due to low potassium. Rescheduled for 2/17.  12/29/2023 Creatinine 1.79, BUN 15, Potassium 3.0, Sodium 142, GFR 44 (Prescribed potassium) 12/16/2023 Creatinine 1.61, BUN 20, Potassium 3.7, Sodium 141 12/04/2023 Creatinine 1.51, BUN 23, Potassium 3.7, Sodium 136, GFR 54 A complete set of results can be found in Results Review.   Recommendations:   Advised to take Torsemide 60 mg tomorrow morning x 1 day only since he does not take the afternoon prescribed dose and then return to 40 mg every morning.   Follow-up plan: ICM  clinic phone appointment 01/18/2024 to recheck fluid levels.   91 day device clinic remote transmission 01/26/2024.     EP/Cardiology Office Visits:    Recall 08/09/2023 with Francis Dowse, PA   Copy of ICM check sent to Dr. Lalla Brothers.   3 month ICM trend: 01/10/2024.    12-14 Month ICM trend:     Karie Soda, RN 01/12/2024 7:47 AM

## 2024-01-13 ENCOUNTER — Other Ambulatory Visit (HOSPITAL_COMMUNITY): Payer: Self-pay | Admitting: *Deleted

## 2024-01-13 DIAGNOSIS — I428 Other cardiomyopathies: Secondary | ICD-10-CM

## 2024-01-13 DIAGNOSIS — I5022 Chronic systolic (congestive) heart failure: Secondary | ICD-10-CM

## 2024-01-18 ENCOUNTER — Ambulatory Visit
Admission: RE | Admit: 2024-01-18 | Discharge: 2024-01-18 | Disposition: A | Discharge status: Home / Self Care | Payer: Medicare HMO | Source: Ambulatory Visit | Attending: Cardiology | Admitting: Cardiology

## 2024-01-18 ENCOUNTER — Other Ambulatory Visit (HOSPITAL_COMMUNITY): Payer: Medicare HMO

## 2024-01-18 ENCOUNTER — Encounter (HOSPITAL_COMMUNITY): Payer: Self-pay

## 2024-01-18 ENCOUNTER — Ambulatory Visit (INDEPENDENT_AMBULATORY_CARE_PROVIDER_SITE_OTHER): Payer: Medicare HMO

## 2024-01-18 DIAGNOSIS — I480 Paroxysmal atrial fibrillation: Secondary | ICD-10-CM | POA: Insufficient documentation

## 2024-01-18 DIAGNOSIS — N183 Chronic kidney disease, stage 3 unspecified: Secondary | ICD-10-CM | POA: Diagnosis not present

## 2024-01-18 DIAGNOSIS — Z7901 Long term (current) use of anticoagulants: Secondary | ICD-10-CM | POA: Insufficient documentation

## 2024-01-18 DIAGNOSIS — F329 Major depressive disorder, single episode, unspecified: Secondary | ICD-10-CM | POA: Insufficient documentation

## 2024-01-18 DIAGNOSIS — E0590 Thyrotoxicosis, unspecified without thyrotoxic crisis or storm: Secondary | ICD-10-CM | POA: Diagnosis not present

## 2024-01-18 DIAGNOSIS — E1122 Type 2 diabetes mellitus with diabetic chronic kidney disease: Secondary | ICD-10-CM | POA: Diagnosis not present

## 2024-01-18 DIAGNOSIS — F410 Panic disorder [episodic paroxysmal anxiety] without agoraphobia: Secondary | ICD-10-CM | POA: Diagnosis not present

## 2024-01-18 DIAGNOSIS — M25579 Pain in unspecified ankle and joints of unspecified foot: Secondary | ICD-10-CM | POA: Diagnosis not present

## 2024-01-18 DIAGNOSIS — G4733 Obstructive sleep apnea (adult) (pediatric): Secondary | ICD-10-CM | POA: Insufficient documentation

## 2024-01-18 DIAGNOSIS — Z7984 Long term (current) use of oral hypoglycemic drugs: Secondary | ICD-10-CM | POA: Diagnosis not present

## 2024-01-18 DIAGNOSIS — Z79899 Other long term (current) drug therapy: Secondary | ICD-10-CM | POA: Diagnosis not present

## 2024-01-18 DIAGNOSIS — F17210 Nicotine dependence, cigarettes, uncomplicated: Secondary | ICD-10-CM | POA: Diagnosis not present

## 2024-01-18 DIAGNOSIS — Z86711 Personal history of pulmonary embolism: Secondary | ICD-10-CM | POA: Diagnosis not present

## 2024-01-18 DIAGNOSIS — I428 Other cardiomyopathies: Secondary | ICD-10-CM

## 2024-01-18 DIAGNOSIS — F419 Anxiety disorder, unspecified: Secondary | ICD-10-CM | POA: Diagnosis not present

## 2024-01-18 DIAGNOSIS — Z95810 Presence of automatic (implantable) cardiac defibrillator: Secondary | ICD-10-CM

## 2024-01-18 DIAGNOSIS — I5042 Chronic combined systolic (congestive) and diastolic (congestive) heart failure: Secondary | ICD-10-CM | POA: Diagnosis not present

## 2024-01-18 DIAGNOSIS — I5022 Chronic systolic (congestive) heart failure: Secondary | ICD-10-CM | POA: Diagnosis not present

## 2024-01-18 DIAGNOSIS — I130 Hypertensive heart and chronic kidney disease with heart failure and stage 1 through stage 4 chronic kidney disease, or unspecified chronic kidney disease: Secondary | ICD-10-CM | POA: Diagnosis not present

## 2024-01-18 DIAGNOSIS — R7989 Other specified abnormal findings of blood chemistry: Secondary | ICD-10-CM

## 2024-01-18 LAB — BRAIN NATRIURETIC PEPTIDE: B Natriuretic Peptide: 244.5 pg/mL — ABNORMAL HIGH (ref 0.0–100.0)

## 2024-01-18 LAB — BASIC METABOLIC PANEL
Anion gap: 10 (ref 5–15)
BUN: 24 mg/dL — ABNORMAL HIGH (ref 6–20)
CO2: 23 mmol/L (ref 22–32)
Calcium: 9.3 mg/dL (ref 8.9–10.3)
Chloride: 102 mmol/L (ref 98–111)
Creatinine, Ser: 1.56 mg/dL — ABNORMAL HIGH (ref 0.61–1.24)
GFR, Estimated: 52 mL/min — ABNORMAL LOW (ref >60)
Glucose, Bld: 95 mg/dL (ref 70–99)
Potassium: 4.5 mmol/L (ref 3.5–5.1)
Sodium: 135 mmol/L (ref 135–145)

## 2024-01-18 LAB — TSH: TSH: 6.339 u[IU]/mL — ABNORMAL HIGH (ref 0.350–4.500)

## 2024-01-18 LAB — T4, FREE: Free T4: 0.93 ng/dL (ref 0.61–1.12)

## 2024-01-18 NOTE — Patient Instructions (Signed)
No change in medications Return to CHF clinic in 4 weeks with Dr Shirlee Latch

## 2024-01-18 NOTE — Progress Notes (Signed)
Patient presents for 3 week follow up in VAD Clinic today with his family. Social evaluation completed today.   VAD evaluation signed 09/15/23 and pt requested second opinion at [facility]. Pt seen by Dr. Edwena Blow and deemed not a transplant candidate at this time due to his weight. Pt has previously declined VAD therapy.   Pt ambulated independently using a cane into clinic this morning accompanied by his mother and ex wife. Pt denies lightheadedness, dizziness, falls, shortness of breath, and signs of bleeding.   EKG obtained today pt AV paced at 73 bpm.  Discussion held about VAD therapy again. Pt now open to considering VAD as an option. Evaluation started in October. Pt will need to meet with Dr. Laneta Simmers and complete his social evaluation to complete. Pt identified his mother as his primary caregiver if this therapy should be deemed appropriate for him. Explained need for 24/7 care when pt is discharged home due to sternal precautions, adaptation to living on support, emotional support, consistent and meticulous exit site care and management, medication adherence and high volume of follow up visits with the VAD Clinic after discharge; both pt and caregiver verbalized understanding of above.   Vital Signs:  HR: 76 BP: 122/83 (95) SPO2: 97 %   Weight: 311 lb  Last weight: 319 lb  Symptom YES NO DETAILS  Angina  X Activity:  Claudication  X How Far:  Syncope  X When:  Stroke  X   Orthopnea  X How many pillows:  PND   How often:  CPAP   How many hours:  Pedal Edema  X   Abdominal Fullness  X   Nausea / Vomit X  Daily with food  Diaphoresis  X When:  Shortness of Breath  X Activity:  Palpitations  X When:  ICD shock     Bleeding S/S  X   Tea-colored Urine     Hospitalizations     Emergency Room X  11/22 ankle pain- xrays obtained no fracture- placed in boot and referred to orthopedics   Other MD   10/05/23- Surgical evaluation with Dr. Maren Beach 12/22/23- Endocrinology- decrease Methimazole  to 5mg  daily 12/04/23 Psych- start Melatonin for sleep  Activity   Fluid   Diet    Addendum:  Pt's K+ came back at 3.0. Pt currently take as needed. Discussed with Dr. Shirlee Latch. Pt to increase to 40 meq daily (2 tablets). Detailed VM left with instructions.   Device: Abbott Therapies: ON VF 222 Last check: 10/26/23  Patient Instructions:   Reduce Mounjaro dose to 5mg  weekly; Continue to follow with [facility] for titration  Return to VAD Clinic in 3 weeks for social eval and follow up with Dr. Shirlee Latch Call PCP in regard to ankle pain Continue to take Torsemide 40mg  daily   Simmie Davies RN,BSN VAD Coordinator  Office: [phone] 24/7 Emergency VAD Pager: [phone]

## 2024-01-18 NOTE — Progress Notes (Signed)
Advanced Heart Failure Clinic Note   Primary Care: Dr. Sanda Linger HF Cardiologist: Dr. Shirlee Latch   Chief complaint: Shortness of breath  HPI: [name] is a 58 y.o. male with a past medical history of NICM, EF 25-30% in March 2018, felt to be related to prior ETOH abuse. He also has a history of PE 03/2014 completed a years course of Xarelto, morbid obesity, OSA, and tobacco abuse.    He was admitted 11/12/15-11/14/15 with acute on chronic systolic CHF and palpitations. He wore a 30 day event monitor at discharge as he had frequent PVC's and questionable Afib on telemetry. Also with some NSVT, so he was started on Amiodarone, but at follow up had not started taking it. He had previously refused ICD and was seen inpatient by Dr. Johney Frame who felt that his morbid obesity was a prohibitive factor.    He was seen in the clinic in April 2018. He had started drinking ETOH again. Volume status was stable, he is not an Entresto candidate due to angioedema with lisinopril. Weight was 411 pounds.    Admitted 04/12/17 with SOB, chest pain. D- dimer was 1.16, chest CT without central obstructing PE, however more peripheral and subsegmental pulmonary artery branches were not confidently evaluated due to his body habitus. He was started on a heparin gtt for presumed PE, however VQ scan showed no PE. Troponin was elevated, peaked at 3.37. LHC showed no CAD. Echo showed an EF of 15%, grade 2 DD, no pericarditis. He was diuresed with IV lasix, and started on torsemide 20mg  at discharge. Discharge weight was 405 pounds.   Admitted 5/22 through 04/24/2018 with abdominal pain, nausea, and vomiting. 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 Did not require surgical intervention. Followed by outpatient GI.   Echo in 2/21 with EF 20-25%, severe LV dilation.   Presented 05/09/21 w/ SOB 2/2 acute CHF w ? PNA.  CTA showed multifocal ?PNA, no PE. Also in Afib w/ RVR in 150s on admit. Initially required bipap, developed hemoptysis and  intubated. Treated w/ IV Lasix. Echo 05/10/21: LVEF < 20%. RV moderately reduced. Improved and was extubated 6/13. Went into Afib w/ RVR. Refused to wear BiPAP. Developed severe agitation/hypoxia and re-intubated.  Went into VT>>VF arrest, treated w/ ACLS>>ROSC after . Developed refractory AFib again next morning requiring emergent cardioversion 05/16/21. He was transitioned to PO amiodarone. HR remained in 40-50's, metoprolol and sildenafil were stopped. Discharge weight 367 lbs.  St Jude CRT-D device placed in 2/23.  Echo 8/23 showed EF <20%, mild MR, RV normal.  CPX in 9/23 showed moderate functional limitation due to HF and body habitus.   Echo in 9/24 showed EF <20% with severe LV dilation, normal RV size/function, no significant MR.   CPX was done in 10/24, this showed only mild to moderate HF limitation.  However, RHC in 10/24 showed mildly elevated filling pressures and low cardiac index (1.69 Fick, 2.06 Thermo).    Patient returns today for followup of CHF.  Ankle pain is improved and he is walking more.  Weight is stable.  He is walking with a cane for stability given ankle pain.  He really reports no significant exertional dyspnea currently.  He can walk up a flight of stairs without trouble.  No orthopnea/PND.  No chest pain.  He denies any particular fatigue.      St Jude device interrogation: Recent volume overload, now thoracic impedance at baseline.  95% BiV pacing.  No atrial fibrillation.   Labs (1/19): K 3.8  Creatinine 1.25  Labs (2/19): K 3.8 Creatinine 1.08 Labs (4/19): K 4.1 creatinine 1/15 BNP 212  Labs (5/19): creatinine 1.1  Labs (10/19): LDL 166, K 3.8, creatinine 1.01, AST 102 => 25, ALT 125 => 37, alkaline phosphatase 233 Labs (11/20): LDL 106, TGs 232 Labs (2/21): K 3.3, creatinine 1.3 Labs (4/21): LDL 114, TGs 466, K 3.6, creatinine 1.22 Labs (12/21): K 3.5, creatinine 1.08, LDL 164, TGs 133 Labs (722): K 3.8, creatinine 1.4 Labs (8/22): K 3.8, creatinine  1.23 Labs (9/22): K 3.6, creatinine 1.26 Labs (2/23): LFTs normal, TSH normal, K 4, creatinine 1.3 Labs (8/23): K 3.4, creatinine 1.49 Labs (10/23): K 3.8, creatinine 1.72 Labs (11/23): LFTs normal, TSH normal Labs (1/24): K 3.9, creatinine 1.53 Labs (3/24): K 4.2, creatinine 1.86, LDL 65 Labs (7/24): K 3.5, creatinine 1.10, BNP 241, Hgb 14.5, Hgb A1c 5.5  Labs (8/24): transferrin saturation 22%, BNP 168, TSH < 0.01, K 3.2, creatinine 1.62, LFTs normal Labs (1/25): TSH 12.49, LDL 83, K 3.7, creatinine 1.6 => 1.79, BNP 255, hgb 15.7, AST/ALT normal  PMH: 1. Nonischemic cardiomyopathy: Prior cath with no significant CAD.  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  Prior echoes with EF as low as 25%.  Echo (9/13) with EF 35-40%, moderate to severe LV dilation, diffuse hypokinesis, mild MR. Echo (5/15) with EF 30-35%, moderate to severe LAE, normal RV size and systolic function.  Angioedema with ACEI, headaches with hydralazine/nitrates. Echo (3/16) with EF 25-30%, severe LV dilation, normal RV size and systolic function.  [facility] 08/13/15 showed no significant coronary disease; RA mean 6, PA 33/11 mean 23, PCWP mean 13, Fick CO/CI 4.75 /1.68 (difficult study, radial artery spasm, if needs future cath would use groin).  Echo (9/16) showed EF 20-25%.   - Echo (3/18): EF 25-30%, moderate LAE - Echo 4/18 EF 15% - Echo 7/19 EF 20-25% - Echo 2/21 with EF 20-25%, severe LV dilation.  - Echo 6/22 with EF < 20%, normal RV. - St Jude CRT-D 2/23.  - Echo (8/23): EF < 20%, mild MR, RV normal.  - CPX (9/23): Peak VO2 13.1, VE/VCO2 slope 37, RER 1.10.  Moderate functional limitation, body habitus and HF.  - Echo (9/24): EF <20% with severe LV dilation, normal RV size/function, no significant MR.  - CPX (10/24): peak VO2 14.5, VE/VCO2 slope 33, RER 1.06.  Mild-moderate HF limitation.  - RHC (10/24): mean RA 9, PA 38/15 mean 29, mean PCWP 22, CI 1.69 Fick/2.06 thermo, PAPi  2.5.  2. HTN: angioedema with ACEI.  3. OSA: on Bipap 4. Morbid obesity 5. Paroxysmal atrial fibrillation: Urgent DCCV 6/22. 6. Smoker.  7. Anxiety/panic attacks 8. PE: 5/15, diagnosed by V/Q scan. CTA chest 8/16 negative for PE.  9. NSVT, PVCs: 30 day monitor (12/16) with PVCs, PACs, no atrial fibrillation.  - Zio patch (9/19): few short NSVT runs, no atrial fibrillation, 1.1% PVCs 10. Hematuria: Apparently had negative workup by urology.  11. ABIs (6/16) were normal 12. 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  13. Gout 14. Low back pain.  15. 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 CKD stage 3 17. Type 2 diabetes 18. 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    SH: Separated, lives in [place], drinks ETOH occasionally, no drugs. Prior smoker.  Has son and daughter.     FH: No premature CAD.    Review of systems complete and found to be negative unless listed in HPI.   Current Outpatient Medications  Medication Sig  Dispense Refill   albuterol (VENTOLIN HFA) 108 (90 Base) MCG/ACT inhaler INHALE 1-2 PUFFS INTO THE LUNGS EVERY 4 (FOUR) HOURS AS NEEDED FOR SHORTNESS OF BREATH OR WHEEZING. 8.5 each 1   amiodarone (PACERONE) 200 MG tablet Take 0.5 tablets (100 mg total) by mouth daily. Patient taking once daily 90 tablet 1   atorvastatin (LIPITOR) 40 MG tablet Take 1 tablet (40 mg total) by mouth daily. 90 tablet 3   carvedilol (COREG) 6.25 MG tablet Take 1 tablet (6.25 mg total) by mouth 2 (two) times daily. 60 tablet 11   dapagliflozin propanediol (FARXIGA) 10 MG TABS tablet Take 1 tablet (10 mg total) by mouth daily. 90 tablet 1   losartan (COZAAR) 25 MG tablet Take 0.5 tablets (12.5 mg total) by mouth at bedtime. (Patient not taking: Reported on 12/22/2023) 15 tablet 3   methimazole (TAPAZOLE) 10 MG tablet Take 1 tablet (10 mg total) by mouth daily. (Patient taking differently: Take 5 mg by mouth daily.) 90 tablet 3   pantoprazole (PROTONIX) 40 MG tablet TAKE 1 TABLET BY MOUTH EVERY DAY 90 tablet 0    potassium chloride SA (KLOR-CON M) 20 MEQ tablet Take 2 tablets (40 mEq total) by mouth daily. (Patient taking differently: Take 20 mEq by mouth as needed.) 180 tablet 3   PRESCRIPTION MEDICATION Inhale into the lungs See admin instructions. Bipap, pressure 16/12 with 2L of O2 - use whenever sleeping     sildenafil (VIAGRA) 50 MG tablet Take 50 mg by mouth as needed for erectile dysfunction.     spironolactone (ALDACTONE) 25 MG tablet Take 1 tablet (25 mg total) by mouth at bedtime. 90 tablet 3   tirzepatide (MOUNJARO) 7.5 MG/0.5ML Pen Inject 7.5 mg into the skin once a week. 2 mL 0   torsemide (DEMADEX) 20 MG tablet Take 2 tablets (40 mg total) by mouth every morning AND 1 tablet (20 mg total) every evening. Take 40 mg by mouth every other day alternating with 20 mg every other day. (Patient taking differently: Take 2 tablets (40 mg total) by mouth every morning ) 60 tablet 6   XARELTO 20 MG TABS tablet TAKE 1 TABLET BY MOUTH DAILY WITH SUPPER. 90 tablet 0   No current facility-administered medications for this encounter.   Allergies  Allergen Reactions   Ace Inhibitors Anaphylaxis and Swelling    Angioedema   Isosorbide Anaphylaxis   Bidil [Isosorb Dinitrate-Hydralazine] Other (See Comments)    headache   Digoxin And Related     Unspecified "side effects"   Buspirone Other (See Comments)    dizziness   Social History   Socioeconomic History   Marital status: Divorced    Spouse name: Not on file   Number of children: 3   Years of education: Not on file   Highest education level: Not on file  Occupational History   Occupation: DISABLED  Tobacco Use   Smoking status: Some Days    Current packs/day: 0.00    Average packs/day: 0.5 packs/day for 30.0 years (15.0 ttl pk-yrs)    Types: Cigarettes    Start date: 05/15/1990    Last attempt to quit: 05/15/2020    Years since quitting: 3.6   Smokeless tobacco: Never   Tobacco comments:    vaping - nicotine-free products  Vaping Use    Vaping status: Never Used  Substance and Sexual Activity   Alcohol use: Not Currently    Alcohol/week: 0.0 standard drinks of alcohol   Drug use: No  Sexual activity: Not Currently  Other Topics Concern   Not on file  Social History Narrative   He smokes about a pack per day and he has been smoking since he was 58 years of age.  He drinks alcohol occasionally, but he denies any illicit drug abuse.  He is presently on disability.    Lives with wife in a 2 story home.  Has 2 children.   Previously worked in Office manager, last worked in 1998.   Highest level of education:  11th grade      Lives with his daughter's mother, for right now.  Recently divorced. (2024).        Social Drivers of Health   Financial Resource Strain: Medium Risk (09/17/2023)   Overall Financial Resource Strain (CARDIA)    Difficulty of Paying Living Expenses: Somewhat hard  Food Insecurity: Food Insecurity Present (09/17/2023)   Hunger Vital Sign    Worried About Running Out of Food in the Last Year: Sometimes true    Ran Out of Food in the Last Year: Sometimes true  Transportation Needs: No Transportation Needs (09/17/2023)   PRAPARE - Administrator, Civil Service (Medical): No    Lack of Transportation (Non-Medical): No  Physical Activity: Inactive (09/17/2023)   Exercise Vital Sign    Days of Exercise per Week: 0 days    Minutes of Exercise per Session: 0 min  Stress: Stress Concern Present (09/17/2023)   [employer] of Occupational Health - Occupational Stress Questionnaire    Feeling of Stress : Rather much  Social Connections: Socially Isolated (09/17/2023)   Social Connection and Isolation Panel [NHANES]    Frequency of Communication with Friends and Family: More than three times a week    Frequency of Social Gatherings with Friends and Family: Never    Attends Religious Services: Never    Database administrator or Organizations: No    Attends Banker Meetings: Never     Marital Status: Divorced  Catering manager Violence: Not At Risk (11/24/2023)   Received from [facility]   HITS    Over the last 12 months how often did your partner physically hurt you?: Never    Over the last 12 months how often did your partner insult you or talk down to you?: Never    Over the last 12 months how often did your partner threaten you with physical harm?: Never    Over the last 12 months how often did your partner scream or curse at you?: Never   Family History  Problem Relation Age of Onset   Cancer Mother        brain tumor   Hypertension Mother    Diabetes Father        Deceased, 69   Heart disease Maternal Grandmother    Hypertension Other        Family History   Stroke Other        Family History   Diabetes Other        Family History   Diabetes Daughter    There were no vitals taken for this visit.  Wt Readings from Last 3 Encounters:  12/29/23 (!) 141.1 kg (311 lb)  12/22/23 (!) 144.7 kg (319 lb)  10/23/23 (!) 142 kg (313 lb)   PHYSICAL EXAM: General: NAD Neck: No JVD, no thyromegaly or thyroid nodule.  Lungs: Clear to auscultation bilaterally with normal respiratory effort. CV: Nondisplaced PMI.  Heart regular S1/S2, no S3/S4, no murmur.  No peripheral edema.  No carotid bruit.  Normal pedal pulses.  Abdomen: Soft, nontender, no hepatosplenomegaly, no distention.  Skin: Intact without lesions or rashes.  Neurologic: Alert and oriented x 3.  Psych: Normal affect. Extremities: No clubbing or cyanosis.  HEENT: Normal.   ASSESSMENT & PLAN: 1. Atrial fibrillation: Paroyxsmal. Emergent DCCV in 6/22. He is in NSR today. - Continue amiodarone 100 mg daily. Check LFTs. Will need regular eye exam.  Seeing endocrinology for hyperthyroidism, has been on methimazole and now TSH high (recently cut back methimazole).  - Continue Xarelto.  2.  Chronic systolic CHF: Nonischemic cardiomyopathy. ?If ETOH has played a role (now drinks rarely). Echo (3/18)  with EF 25-30%. Echo 7/19 and in 2/21 with EF 20-25%. Echo 6/22 with EF < 20%, normal RV.  Has St Jude CRT-D device now.  Echo-post CRT (8/23) showed EF < 20%, mild MR, RV normal.  CPX in 9/23 with moderate functional limitation due to HF and body habitus. Echo in 9/24 showed  EF <20% with severe LV dilation, normal RV size/function, no significant MR.  CPX in 10/24 showed mild to moderate HF limitation but RHC in 10/24 showed low output (1.69 thermo/2.05 Fick).  Medication titration has been limited by hypotension.  Symptoms really do not match his 10/24 RHC and seem more like the 10/24 CPX which showed less severe limitation.  NYHA class I-II symptoms at most.  He is not volume overloaded on exam.  - Continue torsemide 40 mg daily. BMET/BNP today.  He says that he is now taking his potassium regularly.  - Unable to tolerate losartan due to low BP (off).   - Continue Coreg 6.25 mg bid. BP will not tolerate titration.   - Continue spironolactone 25 mg daily. - Continue Farxiga 10 mg daily. - No Entresto, ACEI with h/o angioedema.  - Headaches with Bidil, cannot take.  - He did not tolerate digoxin.   - Insurance denied baroreceptor activation therapy.  - In the past, too heavy to consider LVAD or transplant.  He has lost a lot of weight, now down to 311 lbs (was > 400 lbs).  He saw Dr Edwena Blow at [facility], not transplant candidate as BMI is still too high and still smoking but thought he was reasonable for LVAD.  I worry that he is nearing end-stage HF with low output on RHC and difficulty titrating meds. Currently, he seems to be in a window where LVAD would be feasible.  With some worsening of renal function, we may not have a wide window.  However, his CPX showed only a mild to moderate HF limitation and his symptoms are really minimal currently (NYHA I-II). There is a definite disconnect between symptoms and RHC.  We talked extensively about this today and have decided to hold off on LVAD for now but to  follow him closely. He will be seeing Dr. Laneta Simmers to determine surgical candidacy if symptoms were to worsen again.  3. HTN: BP now runs low, limits GDMT.  4. 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  Has St Jude CRT-D device now. On amiodarone. No VT on device interrogation  - continue amiodarone 100 mg daily. See discussion above regarding thyroid labs. 5. OSA: reports full compliance w/ BiPAP.  - Continue nightly. 6. PE: 04/14/2018, diagnosed by V/Q scan. Has been on Xarelto. No bleeding issues. 7. Anxiety/Panic attacks/depression: Followed by psychiatry.  Seems to be doing better.  8. Hyperlipidemia:  LDL 65 (3/24) - Continue atorvastatin. 9. Obesity:  He is now on [place]. Has lost >100 lb so far.  10. Tobacco use: Back smoking. Discussed cessation. 11. Iron Deficiency Anemia: received dose of Feraheme 8/24 12. T2DM  - on Mounjaro and Farxiga 13. Hyperthyroidism: Suspect related to amiodarone.  He is now on methimazole and TSH is elevated.  - Followed by endocrinology and cutting back on methimazole.   Followup 4 wks.   I spent 42 minutes reviewing data, interviewing patient and his family, and organizing the orders/followup.   [name]  01/18/2024

## 2024-01-18 NOTE — Progress Notes (Signed)
EPIC Encounter for ICM Monitoring  Patient Name: [name] is a 58 y.o. male Date: 01/18/2024 Primary Care Physican: Etta Grandchild, MD Primary Cardiologist: Shirlee Latch Electrophysiologist: Townsend Roger Pacing: 95% 02/04/2023 Weight:  358 lbs       06/01/2023 Weight: 328 lbs (losing weight taking Mounjaro)   06/09/2023 Weight: 328 lbs             07/07/2023 Weight: 308 lbs   9/10/202 Weight: 308 lbs      09/16/2023 Weight: 317 lbs    12/29/2023 Office Weight: 311 lbs   Spoke with patient and heart failure questions reviewed.  Transmission results reviewed.  Pt reports only taking one extra Torsemide as recommended.      Diet:  He reports 2/11 eating foods high in salt over the last couple of weeks.   CorVue thoracic impedance suggesting fluid levels changed from possible fluid accumulation to possible dryness starting 12/10 after recommending to take extra 20 mg Torsemide x 1 day on 2/11.     Prescribed:  Torsemide 20 mg Take 2 tablet(s) (40 mg total) every morning and 1 tablet (20 mg total) every afternoon.  01/12/2024 Pt reports he is only taking Torsemide 40 mg in the morning and no afternoon dose.  Potassium 20 mEq take 2 tablet(s) (40 mEq total) by mouth daily.  2/11 Confirmed he is taking Potassium every day as prescribed.  Spironolactone 25 mg take 1 tablet daily   Labs: 01/18/2024 BMET Scheduled at HF clinic.    12/29/2023 Creatinine 1.79, BUN 15, Potassium 3.0, Sodium 142, GFR 44 (Prescribed potassium) 12/16/2023 Creatinine 1.61, BUN 20, Potassium 3.7, Sodium 141 12/04/2023 Creatinine 1.51, BUN 23, Potassium 3.7, Sodium 136, GFR 54 A complete set of results can be found in Results Review.   Recommendations:   Pt being seen in HF clinic office in VAD clinic.  Advised to increase fluid intake today prior to lab and office visit this afternoon.   Follow-up plan: ICM clinic phone appointment 02/22/2024.   91 day device clinic remote transmission 01/26/2024.     EP/Cardiology Office  Visits:    Recall 08/09/2023 with Francis Dowse, PA   Copy of ICM check sent to Dr. Lalla Brothers and Dr Shirlee Latch as Lorain Childes.    3 month ICM trend: 01/17/2024.    12-14 Month ICM trend:     Karie Soda, RN 01/18/2024 11:53 AM

## 2024-01-19 NOTE — Progress Notes (Signed)
LVAD Initial Psychosocial Screening  Date/Time Initiated:  01-18-24 1:30pm Referral Source:  [name] [name], VAD Coordinator Referral Reason:  LVAD implantation Source of Information:  Pt, Ex-wife, [name] and chart review  Demographics Name:  [name] [name] Address: [address] [place], [place] [address] Home phone:  [phone] (home)    Cell: [phone] Marital Status: Divorced  Faith:  Christianity  Primary Language:  English Last 4 # SS: [id number]  DOB: 1966-07-24   Psychological Health Appearance:  Unremarkable Mental Status:  Alert, oriented Eye Contact:  Good Thought Content:  Coherent Speech:  Logical/coherent Mood:  Apprehensive and Pleasant  Affect:  Apprehensive Insight:  Good Judgement: Unimpaired Interaction Style:  Engaged   Family/Social Information Who lives in your home? Name:   Relationship:   [name] [name]   44 yo son [name] [name]   61 yo step son  Other family members/support persons in your life? Name:   Relationship:  [name] [name]  (works at [employer]) Radiation protection practitioner Mother  Community Are you active with community agencies/resources/homecare? No  Are you active in a church, synagogue, mosque or other faith based community? No  Do you have other sources do you have for spiritual support? Mother- prayer and bible studies Are you active in any clubs or social organizations? no What do you do for fun?  Hobbies?  Interests? 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 Yes [employer] Do you own or rent your home? own Number of steps into the home? 1 step How many levels in the home? 2 with 16 steps to second floor bedroom Electrical needs for LVAD (3 prong outlets)? yes Second hand smoke exposure in the home? Ex wife smokes in garage Travel distance from [place]? 15 minutes   Financial Information What is your source of income? Social Security  Disability since 2000 Do you have difficulty meeting your monthly expenses? Yes sometimes How do you cope with this? "Get it done- budget" Can you budget for the monthly cost for dressing supplies post procedure? Yes   Primary Health insurance:  [employer] Secondary Insurance: Medicaid Prescription plan: varies Have you ever had to refuse medication due to cost?  Yes Do you use mail order for your prescriptions?  No Have you applied for Medicaid?  Current Have you applied for Social Security Disability/(SSI)  Current   Education/Work Information What is the last grade of school you completed? 11th grade Do you have any problems with reading or writing?  No Preferred method of learning?  Hands on Are you currently employed?  No  If not currently employed when did you last work? 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 Many years Did you serve in the military? No     Advance Directives: Do you have a Living Will or Medical POA? No  Would you like to complete a Living Will and Medical POA prior to surgery?  Yes Have you had a consult with the Palliative Care Team at [facility]? No   Legal Do you currently have any legal issues/problems?  denies Have you had any legal issues/problems in the past?  denies Do you have a Durable POA?  denies  Medical Information Do you have any family history of heart problems? Grandmother and uncle Do you have a PCP or other medical provider? [name] [name] Are you able to complete your ADL's?  yes Do  you have any assistive devices in the home? cane How are you currently managing your medications at home? Take from the bottles How many hours do you sleep at night? Not sleeping good How is your appetite? Not eating much Do you smoke now or past usage? Yes   1/2 pack a day for 30+ years Do you drink alcohol now or past usage? past usage    Quit date: years ago "drank all the time"   Are you  currently using illegal drugs or misuse of medication or past usage? never  Have you ever been treated for substance abuse? No   Mental Health History How have you been feeling in the past year? "With everything going on (2nd divorce) over the past year I am trying to get my head around it"  PHQ2 Depression Scale: 5  Have you ever had any problems with depression, anxiety or other mental health concerns? Yes with depression, PTSD and anxiety Have you or are you taking medications for anxiety/depression or any mental health concerns?  No was taking multiple medications but currently off all. Current Medications: none at this time Do you have a history of a traumatic event? "I don't want to talk about it" Have you had any past or current thoughts of suicide? never Are there any other current stressors in your life?  no Do you see a counselor, psychiatrist or therapist?  Yes- [facility] [name] [name] If you are currently experiencing problems are you interested in talking with a professional? No What are your coping strategies under stressful situations? Keep my mind occupied  Would you be interested in attending the LVAD support group? Yes will consider   Medical & Follow-up Do you take your medications as prescribed by the doctors? Yes Do you feel as if you have a good understanding of your medications and what they are for? yes If you experience medical concerns or barriers to care in between appointments how do you manage this? Call the clinic or will use the VAD pager No Show Rate Percentage: 10%    Caregiving Needs Who is the primary caregiver? [name] [name]- 1st ex wife Health status:  good Do you drive?  yes Do you work?  Unemployed currently Physical Limitations:  none Do you have other care giving responsibilities?  no Contact number: [phone]  Who is the secondary caregiver? [name] [name]- [name] Health status:  good Do you drive?  yes Do you work?  Yes  [employer] at [employer]  Physical Limitations:  none Do you have other care giving responsibilities?  no Contact number: [phone]   Plan for VAD Implementation Do you know and understand what happens during the VAD surgery? Patient Verbalizes Understanding  of surgery and able to describe details What do you know about the risks and side effect associated with VAD surgery? Patient Verbalizes Understanding  of risks (infection, stroke and death) Explain what will happen right after surgery: Patient Verbalizes Understanding  of OR to ICU and will be intubated  What is your plan for transportation for the first 8 weeks post-surgery? (Patients are not recommended to drive post-surgery for 8 weeks)  Driver: Multiple family members Do you have airbags in your vehicle?  There is a risk of discharging the device if the airbag were to deploy. yes What do you know about your diet post-surgery? Patient Verbalizes Understanding  of Heart healthy How do you plan to complete ADL's post-surgery?  Family will assist Will it be difficult to ask for  help from your caregivers?  No  Please explain what you hope will be improved about your life as a result of receiving the LVAD? "Long life" Please tell me your biggest concern or fear about living with the LVAD?  "Dependency" Please explain your understanding of how your body will change?  Are you worried about these changes? "I think it will but my goal to live longer supercedes the body image"  Do you see any barriers to your surgery or follow-up? Patient shared that he is still considering the VAD and "trying to make a decision".  Understanding of LVAD Patient states understanding of the following: Surgical procedures and risks, Electrical need for LVAD (3 prong outlets), Safety precautions with LVAD (water, etc.), LVAD daily self-care (dressing changes, computer check, extra supplies), Outpatient follow up (LVAD clinic appts, monitoring blood thinners),  and Need for Emergency Planning  Discussed and Reviewed with Patient and Caregiver  Patient's current level of motivation to prepare for LVAD: Motivated for improved health Patient's present Level of Consent for LVAD: Not ready as he states still considering    Education provided to patient/family/caregiver:   Caregiver role and responsibiltiy, Financial planning for LVAD, Role of Clinical Social Worker, and Signs of Depression and Anxiety    Discussed and Reviewed with Patient and Caregiver  Caregiver questions Please explain what you hope will be improved about your life and loved one's life as a result of receiving the LVAD?  Live longer What is your biggest concern or fear about caregiving with an LVAD patient?  The time involved with caregiving What is your plan for availability to provide care 24/7 x2 weeks post op and dressing changes ongoing?  Not currently working Who is the relief/backup caregiver and what is their availability?  [name] Preferred method of learning? Hands on  Do you drive? yes How do you handle stressful situations?  "Well - pray" Do you think you can do this? yes Is there anything that concerns about caregiving?  no Do you provide caregiving to anyone else?  No   Caregiver's current level of motivation to prepare for LVAD: Motivated Caregiver's present level of consent for LVAD: ready when patient has confirmed decision to pursue VAD  Clinical Interventions Needed:    CSW will monitor signs and symptoms of depression and assist with adjustment to life with an LVAD. Will suggest further follow up with Behavioral Health Adam. Vanetta [name] for readiness for LVAD implantation. CSW will follow up with a PHQ9 prior to surgery. CSW will monitor body image concerns and provide support as needed. CSW will refer patient for HPOA,  if not completed prior to surgery if still wishing to complete.  CSW encouraged attendance with the LVAD Support Group to assist further with  adjustment and post implant peer support. Clinical Impressions/Recommendations:   Mr. Harton is a 58 year old male. He reports he recently divorced his 2nd wife and is currently residing with his first wife who he divorced many years ago. His 15 yo son and adult step son also reside in the home. His adult [name] Horatio Pel lives nearby and has agreed to be a caregiver as well. He states he is not involved with any community organizations or social clubs and enjoys playing virtual reality gaming for fun. His home has 2 levels with 16 steps to the bedrooms on the second floor. 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 He would like to complete a living will prior to surgery. He denies any legal issues. He states he has not been sleeping well and not much of an appetite. He admits to tobacco use of 1/2 a pack a day for 30+ years. He states that he used to "drink all the time" but stopped many years ago and never used any illegal substances.   He reports that he has been going through a lot over the past year and "trying to get his head around it". He has a mental health history of depression, PTSD and anxiety. He denies any current medications as he was on multiple in the past and did not like how he felt. He sees [name] [name] for Behavioral health follow up. He states that "don't want to talk about it" when asked about any traumatic events in his life. He denies any suicidal history and scored a 5 on the PHQ2. He states he copes by "keeping my mind occupied"  Mr. Bautch has good family support and caregivers with his ex wife and [name] along with other support persons in the family. He shares that his goal for the LVAD is to live a long life. He states that he is motivated although still struggling with the decision to pursue LVAD. Patient appears to be a good candidate although due to his apprehension and  hesitancy to pursue the patient will be on hold at this time.  CSW will be available as needed. Lasandra Beech, LCSW, CCSW-MCS [phone]

## 2024-01-19 NOTE — Addendum Note (Signed)
Encounter addended by: Marcy Siren, LCSW on: 01/19/2024 10:19 AM  Actions taken: Clinical Note Signed

## 2024-01-19 NOTE — Addendum Note (Signed)
Encounter addended by: Marcy Siren, LCSW on: 01/19/2024 8:45 AM  Actions taken: Pend clinical note

## 2024-01-20 ENCOUNTER — Encounter: Payer: Self-pay | Admitting: Surgery

## 2024-01-20 ENCOUNTER — Institutional Professional Consult (permissible substitution): Payer: Medicare HMO | Admitting: Surgery

## 2024-01-20 ENCOUNTER — Telehealth: Payer: Self-pay | Admitting: Pharmacist

## 2024-01-20 VITALS — BP 140/80 | HR 84 | Resp 18 | Ht 69.0 in

## 2024-01-20 DIAGNOSIS — I5042 Chronic combined systolic (congestive) and diastolic (congestive) heart failure: Secondary | ICD-10-CM | POA: Diagnosis not present

## 2024-01-20 LAB — T3, FREE: T3, Free: 2.5 pg/mL (ref 2.0–4.4)

## 2024-01-20 MED ORDER — MOUNJARO 2.5 MG/0.5ML ~~LOC~~ SOAJ: 2.5000 mg | SUBCUTANEOUS | 6 refills | Status: DC

## 2024-01-20 NOTE — Telephone Encounter (Signed)
Spoke to patient. He feels much better, no more diarrhea or stomach pain. Has been able to eat more. Would like to resume Mounjaro. Will resume at 2.5mg  and stay there for awhile.

## 2024-01-20 NOTE — Progress Notes (Unsigned)
Cardiothoracic Surgery Consultation  PCP is Etta Grandchild, MD Referring Provider is Laurey Morale, MD  Chief Complaint  Patient presents with   Congestive Heart Failure    HPI:  The patient is a 58 year old gentleman with a history of morbid obesity, type 2 diabetes, atrial fibrillation, OSA on CPAP, and chronic systolic heart failure who was referred for consideration of LVAD therapy.  He has been followed by Dr. Shirlee Latch in the advanced heart failure clinic and was seen by Dr. Maren Beach on 10/05/2023 for LVAD evaluation.  He has been well compensated on GDMT.  His last echo on 08/28/2023 showed an LVEF of less than 20% with global hypokinesis and a diastolic LV diameter of 8 cm.  Right ventricular systolic function was normal.  There was no TR and trivial MR.  There is trivial aortic insufficiency.  His last right heart cath on 09/15/2023 showed:   Right Heart Pressures RHC Procedural Findings: Hemodynamics (mmHg) RA mean 9 RV 39/13 PA 38/15, mean 29 PCWP mean 22  Oxygen saturations: PA 65% AO 99%  Cardiac Output (Fick) 4.23  Cardiac Index (Fick) 1.69  PVR 1.65 WU  Cardiac Output (Thermo) 5.14  Cardiac Index (Thermo) 2.06 PVR 1.4 WU  PAPi 2.5    A CPX test on 09/23/2023 showed moderate functional impairment with mild heart failure limitation.  He says that he has felt fairly well overall.  He denies any shortness of breath or significant exertional fatigue.  His lower extremity edema has been under control on Demadex 40 mg daily and spironolactone 25 mg nightly.  His main difficulty over the past year has been the effects of weight loss using Mounjaro.  He was over 400 pounds and since starting on GLP-1 he has dropped to about 311 pounds.  He was not eating for several days at a time and developed malnutrition.  His dose has been decreased and he is now eating a better diet with a prealbumin of 22 and albumin of 3.6 a few weeks ago.  He did have a second opinion at [facility] and  was seen by Dr. Edwena Blow who did not feel he was a transplant candidate at this time due to his weight.  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  He is on disability. He had a history of heavy alcohol use in the past but no longer drinks.  He smokes cigarettes.  Denies any other drug use. Past Medical History:  Diagnosis Date   Alcohol abuse    Anxiety state, unspecified    Atrial fibrillation (HCC)    CHF (congestive heart failure) (HCC)    Chronic systolic heart failure (HCC)    Diabetes mellitus, type II (HCC)    Edema    Gout    History of medication noncompliance    Migraine    Obesity, unspecified    Obstructive sleep apnea    Psychiatric disorder    Pulmonary embolism (HCC)    Shortness of breath     Past Surgical History:  Procedure Laterality Date   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   Procedure: BIV ICD INSERTION CRT-D;  Surgeon: Lanier Prude, MD;  Location: [facility] INVASIVE CV LAB;  Service: Cardiovascular;  Laterality: N/A;   CARDIAC CATHETERIZATION     CARDIAC CATHETERIZATION N/A 08/13/2015   Procedure: Right/Left Heart Cath and Coronary Angiography;  Surgeon: Laurey Morale, MD;  Location: [facility] INVASIVE CV LAB;  Service: Cardiovascular;  Laterality: N/A;   PACEMAKER INSERTION     RIGHT HEART CATH N/A 09/15/2023   Procedure: RIGHT HEART CATH;  Surgeon: Laurey Morale, MD;  Location: [facility] INVASIVE CV LAB;  Service: Cardiovascular;  Laterality: N/A;   RIGHT/LEFT HEART CATH AND CORONARY ANGIOGRAPHY N/A 04/14/2017   Procedure: Right/Left Heart Cath and Coronary Angiography;  Surgeon: Laurey Morale, MD;  Location: [facility] INVASIVE CV LAB;  Service: Cardiovascular;  Laterality: N/A;   TESTICLE SURGERY      Family History  Problem Relation Age of Onset   Cancer Mother        brain tumor   Hypertension Mother    Diabetes Father        Deceased, 75   Heart disease Maternal Grandmother    Hypertension Other        Family History   Stroke  Other        Family History   Diabetes Other        Family History   Diabetes Daughter     Social History Social History   Tobacco Use   Smoking status: Some Days    Current packs/day: 0.00    Average packs/day: 0.5 packs/day for 30.0 years (15.0 ttl pk-yrs)    Types: Cigarettes    Start date: 05/15/1990    Last attempt to quit: 05/15/2020    Years since quitting: 3.6   Smokeless tobacco: Never   Tobacco comments:    vaping - nicotine-free products  Vaping Use   Vaping status: Never Used  Substance Use Topics   Alcohol use: Not Currently    Alcohol/week: 0.0 standard drinks of alcohol   Drug use: No    Current Outpatient Medications  Medication Sig Dispense Refill   albuterol (VENTOLIN HFA) 108 (90 Base) MCG/ACT inhaler INHALE 1-2 PUFFS INTO THE LUNGS EVERY 4 (FOUR) HOURS AS NEEDED FOR SHORTNESS OF BREATH OR WHEEZING. 8.5 each 1   amiodarone (PACERONE) 200 MG tablet Take 0.5 tablets (100 mg total) by mouth daily. Patient taking once daily 90 tablet 1   atorvastatin (LIPITOR) 40 MG tablet Take 1 tablet (40 mg total) by mouth daily. 90 tablet 3   carvedilol (COREG) 6.25 MG tablet Take 1 tablet (6.25 mg total) by mouth 2 (two) times daily. 60 tablet 11   dapagliflozin propanediol (FARXIGA) 10 MG TABS tablet Take 1 tablet (10 mg total) by mouth daily. 90 tablet 1   losartan (COZAAR) 25 MG tablet Take 0.5 tablets (12.5 mg total) by mouth at bedtime. 15 tablet 3   methimazole (TAPAZOLE) 10 MG tablet Take 1 tablet (10 mg total) by mouth daily. (Patient taking differently: Take 5 mg by mouth daily.) 90 tablet 3   pantoprazole (PROTONIX) 40 MG tablet TAKE 1 TABLET BY MOUTH EVERY DAY 90 tablet 0   potassium chloride SA (KLOR-CON M) 20 MEQ tablet Take 2 tablets (40 mEq total) by mouth daily. (Patient taking differently: Take 20 mEq by mouth as needed.) 180 tablet 3   PRESCRIPTION MEDICATION Inhale into the lungs See admin instructions. Bipap, pressure 16/12 with 2L of O2 - use whenever  sleeping     sildenafil (VIAGRA) 50 MG tablet Take 50 mg by mouth as needed for erectile dysfunction.     spironolactone (ALDACTONE) 25 MG tablet Take 1 tablet (25 mg total) by mouth at bedtime. 90 tablet 3   tirzepatide (MOUNJARO) 2.5 MG/0.5ML Pen Inject 2.5 mg into the skin once a week. 2 mL 6   torsemide (DEMADEX) 20 MG  tablet Take 2 tablets (40 mg total) by mouth every morning AND 1 tablet (20 mg total) every evening. Take 40 mg by mouth every other day alternating with 20 mg every other day. (Patient taking differently: Take 2 tablets (40 mg total) by mouth every morning ) 60 tablet 6   XARELTO 20 MG TABS tablet TAKE 1 TABLET BY MOUTH DAILY WITH SUPPER. 90 tablet 0   No current facility-administered medications for this visit.    Allergies  Allergen Reactions   Ace Inhibitors Anaphylaxis and Swelling    Angioedema   Isosorbide Anaphylaxis   Bidil [Isosorb Dinitrate-Hydralazine] Other (See Comments)    headache   Digoxin And Related     Unspecified "side effects"   Buspirone Other (See Comments)    dizziness    Review of Systems  Constitutional:  Positive for appetite change. Negative for activity change and fatigue.  HENT: Negative.    Eyes: Negative.   Respiratory:  Negative for chest tightness and shortness of breath.   Cardiovascular:  Positive for leg swelling. Negative for chest pain.  Gastrointestinal: Negative.   Endocrine: Negative.   Genitourinary: Negative.   Musculoskeletal:        Walks with a cane  Skin: Negative.   Allergic/Immunologic: Negative.   Neurological:  Negative for dizziness and syncope.  Hematological: Negative.   Psychiatric/Behavioral: Negative.      BP (!) 140/80   Pulse 84   Resp 18   Ht 5\' 9"  (1.753 m)   SpO2 97%   BMI 45.93 kg/m  Physical Exam Constitutional:      Appearance: Normal appearance. He is obese.  HENT:     Head: Normocephalic and atraumatic.  Eyes:     Extraocular Movements: Extraocular movements intact.      Conjunctiva/sclera: Conjunctivae normal.     Pupils: Pupils are equal, round, and reactive to light.  Neck:     Vascular: No carotid bruit.  Cardiovascular:     Rate and Rhythm: Normal rate and regular rhythm.     Heart sounds: Normal heart sounds. No murmur heard. Pulmonary:     Effort: Pulmonary effort is normal.     Breath sounds: Normal breath sounds.  Abdominal:     General: There is no distension.     Palpations: Abdomen is soft.     Tenderness: There is no abdominal tenderness.  Musculoskeletal:        General: No swelling.  Skin:    General: Skin is warm and dry.     Coloration: Skin is not jaundiced.  Neurological:     General: No focal deficit present.     Mental Status: He is alert and oriented to person, place, and time.  Psychiatric:        Mood and Affect: Mood normal.        Behavior: Behavior normal.      Diagnostic Tests:  ECHOCARDIOGRAM REPORT       Patient Name:   GORDON VANDUNK Date of Exam: 08/28/2023  Medical Rec #:  [id number]      Height:       69.0 in  Accession #:    [id number]     Weight:       314.0 lb  Date of Birth:  [birth year]     BSA:          2.503 m  Patient Age:    56 years       BP:  102/60 mmHg  Patient Gender: M              HR:           106 bpm.  Exam Location:  Outpatient   Procedure: 2D Echo, Cardiac Doppler and Color Doppler   Indications:    CHF    History:        Patient has prior history of Echocardiogram examinations.    Sonographer:    Hilbert Corrigan Thornton-Maynard  Referring Phys: [id number] D[address] S MCLEAN   IMPRESSIONS     1. Left ventricular ejection fraction, by estimation, is <20%. The left  ventricle has severely decreased function. The left ventricle demonstrates  global hypokinesis. The left ventricular internal cavity size was severely  dilated. Left ventricular  diastolic parameters are consistent with Grade I diastolic dysfunction  (impaired relaxation). No LV thrombus noted.   2. Right ventricular  systolic function is normal. The right ventricular  size is normal. Tricuspid regurgitation signal is inadequate for assessing  PA pressure.   3. Left atrial size was mildly dilated.   4. The mitral valve is normal in structure. Trivial mitral valve  regurgitation. No evidence of mitral stenosis.   5. The aortic valve is tricuspid. Aortic valve regurgitation is trivial.  No aortic stenosis is present.   6. The IVC was not visualized.   FINDINGS   Left Ventricle: Left ventricular ejection fraction, by estimation, is  <20%. The left ventricle has severely decreased function. The left  ventricle demonstrates global hypokinesis. The left ventricular internal  cavity size was severely dilated. There is  no left ventricular hypertrophy. Left ventricular diastolic parameters are  consistent with Grade I diastolic dysfunction (impaired relaxation).   Right Ventricle: The right ventricular size is normal. No increase in  right ventricular wall thickness. Right ventricular systolic function is  normal. Tricuspid regurgitation signal is inadequate for assessing PA  pressure.   Left Atrium: Left atrial size was mildly dilated.   Right Atrium: Right atrial size was normal in size.   Pericardium: There is no evidence of pericardial effusion.   Mitral Valve: The mitral valve is normal in structure. Trivial mitral  valve regurgitation. No evidence of mitral valve stenosis.   Tricuspid Valve: The tricuspid valve is normal in structure. Tricuspid  valve regurgitation is not demonstrated.   Aortic Valve: The aortic valve is tricuspid. Aortic valve regurgitation is  trivial. Aortic regurgitation PHT measures 297 msec. No aortic stenosis is  present. Aortic valve mean gradient measures 3.0 mmHg. Aortic valve peak  gradient measures 5.4 mmHg.  Aortic valve area, by VTI measures 2.68 cm.   Pulmonic Valve: The pulmonic valve was normal in structure. Pulmonic valve  regurgitation is not visualized.    Aorta: The aortic root is normal in size and structure.   Venous: The IVC was not visualized. The inferior vena cava was not well  visualized.   IAS/Shunts: No atrial level shunt detected by color flow Doppler.   Additional Comments: A device lead is visualized in the right ventricle.     LEFT VENTRICLE  PLAX 2D  LVIDd:         8.00 cm      Diastology  LVIDs:         7.20 cm      LV e' medial:    4.95 cm/s  LV PW:         1.20 cm      LV E/e' medial:  11.5  LV IVS:  1.10 cm      LV e' lateral:   6.18 cm/s  LVOT diam:     2.40 cm      LV E/e' lateral: 9.2  LV SV:         48  LV SV Index:   19  LVOT Area:     4.52 cm    LV Volumes (MOD)  LV vol d, MOD A2C: 388.0 ml  LV vol d, MOD A4C: 448.0 ml  LV vol s, MOD A2C: 297.0 ml  LV vol s, MOD A4C: 363.0 ml  LV SV MOD A2C:     91.0 ml  LV SV MOD A4C:     448.0 ml  LV SV MOD BP:      83.5 ml   RIGHT VENTRICLE  RV Basal diam:  3.40 cm  RV S prime:     14.30 cm/s  TAPSE (M-mode): 1.6 cm   LEFT ATRIUM              Index        RIGHT ATRIUM           Index  LA diam:        5.60 cm  2.24 cm/m   RA Area:     14.90 cm  LA Vol (A2C):   110.0 ml 43.95 ml/m  RA Volume:   35.30 ml  14.11 ml/m  LA Vol (A4C):   108.0 ml 43.16 ml/m  LA Biplane Vol: 111.0 ml 44.35 ml/m   AORTIC VALVE                    PULMONIC VALVE  AV Area (Vmax):    2.62 cm     PV Vmax:       0.93 m/s  AV Area (Vmean):   2.64 cm     PV Peak grad:  3.5 mmHg  AV Area (VTI):     2.68 cm  AV Vmax:           116.00 cm/s  AV Vmean:          80.700 cm/s  AV VTI:            0.178 m  AV Peak Grad:      5.4 mmHg  AV Mean Grad:      3.0 mmHg  LVOT Vmax:         67.10 cm/s  LVOT Vmean:        47.100 cm/s  LVOT VTI:          0.105 m  LVOT/AV VTI ratio: 0.59  AI PHT:            297 msec    AORTA  Ao Root diam: 3.40 cm  Ao Asc diam:  4.10 cm  Ao Arch diam: 3.5 cm   MITRAL VALVE  MV Area (PHT): 2.87 cm    SHUNTS  MV Decel Time: 264 msec    Systemic VTI:   0.10 m  MV E velocity: 57.10 cm/s  Systemic Diam: 2.40 cm  MV A velocity: 65.00 cm/s  MV E/A ratio:  0.88   Dalton McleanMD  Electronically signed by Wilfred Lacy  Signature Date/Time: 08/28/2023/12:07:51 PM        Final       Impression:  The patient is a 58 year old gentleman with nonischemic cardiomyopathy and an ejection fraction of less than 20% with NYHA class II symptoms of congestive heart failure.  He is currently well compensated  on GDMT.  I think he is likely to require advanced therapies over the next 6 to 12 months.  At the present time his liver and kidney function appears stable although he has an elevated baseline creatinine which was 1.56 on 01/18/2024.  It has been 1.4-1.8 over the past couple years.  His right ventricular systolic function is normal.  He is not a transplant candidate due to his weight and smoking although his weight may continue to decrease.  I think he would be an acceptable candidate for LVAD therapy although his risk of perioperative complications and longer-term driveline infections would be increased due to his morbid obesity.  I discussed LVAD therapy with him including the operative procedure, benefits, and risks and answered all of his questions.  He feels fairly well at this time and is not ready to commit to LVAD therapy quite yet.  He is going to continue follow-up with the heart failure team and will let us know if there is any worsening in his signs or symptoms of congestive heart failure.  He understands the importance of instituting advanced therapies before he has irreversible endorgan damage.  Plan:  He will continue to follow-up in heart failure clinic and I will be happy to see him if the need arises for further discussion of LVAD therapy.  I spent 60 minutes performing this consultation and > 50% of this time was spent face to face counseling and coordinating the care of this patient's chronic congestive heart failure.  Alleen Borne,  MD [facility] [phone]

## 2024-01-21 ENCOUNTER — Encounter: Payer: Medicare HMO | Admitting: Surgery

## 2024-01-26 ENCOUNTER — Ambulatory Visit (INDEPENDENT_AMBULATORY_CARE_PROVIDER_SITE_OTHER): Payer: Medicare HMO

## 2024-01-26 DIAGNOSIS — I428 Other cardiomyopathies: Secondary | ICD-10-CM

## 2024-01-26 DIAGNOSIS — I5022 Chronic systolic (congestive) heart failure: Secondary | ICD-10-CM

## 2024-01-27 ENCOUNTER — Telehealth: Payer: Self-pay | Admitting: Psychiatry

## 2024-01-27 LAB — CUP PACEART REMOTE DEVICE CHECK
Battery Remaining Longevity: 60 mo
Battery Remaining Percentage: 68 %
Battery Voltage: 2.96 V
Brady Statistic AP VP Percent: 68 %
Brady Statistic AP VS Percent: 2.8 %
Brady Statistic AS VP Percent: 28 %
Brady Statistic AS VS Percent: 1 %
Brady Statistic RA Percent Paced: 69 %
Date Time Interrogation Session: 20250224210102
HighPow Impedance: 79 Ohm
Implantable Lead Connection Status: 753985
Implantable Lead Connection Status: 753985
Implantable Lead Connection Status: 753985
Implantable Lead Implant Date: 20230213
Implantable Lead Implant Date: 20230213
Implantable Lead Implant Date: 20230213
Implantable Lead Location: 753857
Implantable Lead Location: 753859
Implantable Lead Location: 753860
Implantable Pulse Generator Implant Date: 20230213
Lead Channel Impedance Value: 1175 Ohm
Lead Channel Impedance Value: 440 Ohm
Lead Channel Impedance Value: 490 Ohm
Lead Channel Pacing Threshold Amplitude: 0.75 V
Lead Channel Pacing Threshold Amplitude: 0.75 V
Lead Channel Pacing Threshold Amplitude: 0.75 V
Lead Channel Pacing Threshold Pulse Width: 0.5 ms
Lead Channel Pacing Threshold Pulse Width: 0.5 ms
Lead Channel Pacing Threshold Pulse Width: 0.5 ms
Lead Channel Sensing Intrinsic Amplitude: 12 mV
Lead Channel Sensing Intrinsic Amplitude: 2.1 mV
Lead Channel Setting Pacing Amplitude: 1.5 V
Lead Channel Setting Pacing Amplitude: 2 V
Lead Channel Setting Pacing Amplitude: 2.5 V
Lead Channel Setting Pacing Pulse Width: 0.5 ms
Lead Channel Setting Pacing Pulse Width: 0.5 ms
Lead Channel Setting Sensing Sensitivity: 0.5 mV
Pulse Gen Serial Number: 210000080
Zone Setting Status: 755011

## 2024-02-02 ENCOUNTER — Encounter: Payer: Self-pay | Admitting: Cardiology

## 2024-02-03 ENCOUNTER — Telehealth: Payer: Self-pay

## 2024-02-03 NOTE — Telephone Encounter (Signed)
 pt states he is not sleeping and his depression is worse. wanted to know if he can get something to help him. pt was last seen on 1-3 next appt 3-26. ( pt was added to the waitlist for sooner appt)

## 2024-02-03 NOTE — Telephone Encounter (Signed)
 Per review of notes it looks like patient was advised to take melatonin due to his cardiac condition and to avoid any other medication that could cause cardiac side effects.  Please check with patient the dosage of his melatonin.  If he is only taking melatonin 6 mg please advise him to increase it to a 10 mg.  He could also discuss this with cardiology before increasing the dosage.  Will defer further management to Dr. Vanetta Shawl once she is back in office.  Please place him on a list to call for sooner appointment as needed.

## 2024-02-04 NOTE — Telephone Encounter (Signed)
 Patient with cardiac problems as well as sleep apnea. Since he is already on a high dosage of melatonin and still not effective, could try a low dosage Belsomra. However he could discuss with cardiology first.

## 2024-02-04 NOTE — Telephone Encounter (Signed)
 pt states he is taking 2 of the 10mg  melatonin gummies. I advised him to call his cardiologist and see what he recommends him to take especially with his heart condition. the provider should be able to let him know what is safe for him to take.  He agreed and stated that he would call provider.  I told him that I would let dr. Elna Breslow know also.

## 2024-02-05 NOTE — Telephone Encounter (Signed)
 pt states that he waiting on cardiologist to call him back but that he needed something to help him sleep so if dr. Elna Breslow could send in the belsomra as she recommended. [facility] [place]   Pt was told to check with the pharmacy later today

## 2024-02-10 ENCOUNTER — Other Ambulatory Visit: Payer: Self-pay | Admitting: Internal Medicine

## 2024-02-10 DIAGNOSIS — K219 Gastro-esophageal reflux disease without esophagitis: Secondary | ICD-10-CM

## 2024-02-11 ENCOUNTER — Telehealth (HOSPITAL_COMMUNITY): Payer: Self-pay | Admitting: Cardiology

## 2024-02-11 NOTE — Telephone Encounter (Signed)
 Patient called to report OTC melatonin is not helping for sleep management. Reports he was told by PCP/psychiatry they will not prescribe meds with cardiology history and VAD work up.  Educated HF provider can provide recommendations however further management will need to come from PCP   Please advise

## 2024-02-11 NOTE — Telephone Encounter (Signed)
 He can have trazodone 50 mg at bedtime prn insomnia.

## 2024-02-12 MED ORDER — TRAZODONE HCL 50 MG PO TABS: 25.0000 mg | ORAL_TABLET | Freq: Every evening | ORAL | 0 refills | Status: DC | PRN

## 2024-02-12 NOTE — Telephone Encounter (Signed)
 Rx written by Dr Lenore Manner  Pt aware script sent

## 2024-02-12 NOTE — Telephone Encounter (Signed)
 pt states that he is still not sleeping. pt states he called the cardiologist but they just keep telling him that he will have to call our office. pt states that he not sleeping that that he got to sleep around 5:00 this morning and woke up at 9:00

## 2024-02-12 NOTE — Telephone Encounter (Signed)
 pt would like for you to send something. he is not sleeping he states he try anything.
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
## 2024-02-15 NOTE — Telephone Encounter (Signed)
 pt had already been told to check with his pharmacy later friday. called pt today and he slept better. thank you.

## 2024-02-17 ENCOUNTER — Telehealth (HOSPITAL_COMMUNITY): Payer: Self-pay | Admitting: Cardiology

## 2024-02-17 ENCOUNTER — Encounter: Payer: Self-pay | Admitting: Psychiatry

## 2024-02-17 ENCOUNTER — Telehealth (INDEPENDENT_AMBULATORY_CARE_PROVIDER_SITE_OTHER): Admitting: Psychiatry

## 2024-02-17 DIAGNOSIS — F3131 Bipolar disorder, current episode depressed, mild: Secondary | ICD-10-CM

## 2024-02-17 DIAGNOSIS — F410 Panic disorder [episodic paroxysmal anxiety] without agoraphobia: Secondary | ICD-10-CM | POA: Diagnosis not present

## 2024-02-17 DIAGNOSIS — F4310 Post-traumatic stress disorder, unspecified: Secondary | ICD-10-CM | POA: Diagnosis not present

## 2024-02-17 DIAGNOSIS — G4700 Insomnia, unspecified: Secondary | ICD-10-CM | POA: Diagnosis not present

## 2024-02-17 DIAGNOSIS — F1021 Alcohol dependence, in remission: Secondary | ICD-10-CM

## 2024-02-17 MED ORDER — AMIODARONE HCL 200 MG PO TABS: 100.0000 mg | ORAL_TABLET | Freq: Every day | ORAL | 6 refills | Status: DC

## 2024-02-17 NOTE — Telephone Encounter (Signed)
 Patient called to request refill on amio  meds sent

## 2024-02-22 ENCOUNTER — Ambulatory Visit (HOSPITAL_COMMUNITY)
Admission: RE | Admit: 2024-02-22 | Discharge: 2024-02-22 | Disposition: A | Discharge status: Home / Self Care | Payer: Medicare HMO | Source: Ambulatory Visit | Attending: Cardiology | Admitting: Cardiology

## 2024-02-22 ENCOUNTER — Ambulatory Visit (INDEPENDENT_AMBULATORY_CARE_PROVIDER_SITE_OTHER): Payer: Medicare HMO

## 2024-02-22 VITALS — BP 102/81 | HR 70 | Wt 330.0 lb

## 2024-02-22 DIAGNOSIS — I5042 Chronic combined systolic (congestive) and diastolic (congestive) heart failure: Secondary | ICD-10-CM

## 2024-02-22 DIAGNOSIS — Z6841 Body Mass Index (BMI) 40.0 and over, adult: Secondary | ICD-10-CM | POA: Insufficient documentation

## 2024-02-22 DIAGNOSIS — Z8674 Personal history of sudden cardiac arrest: Secondary | ICD-10-CM | POA: Insufficient documentation

## 2024-02-22 DIAGNOSIS — I5022 Chronic systolic (congestive) heart failure: Secondary | ICD-10-CM

## 2024-02-22 DIAGNOSIS — F410 Panic disorder [episodic paroxysmal anxiety] without agoraphobia: Secondary | ICD-10-CM | POA: Insufficient documentation

## 2024-02-22 DIAGNOSIS — Z7984 Long term (current) use of oral hypoglycemic drugs: Secondary | ICD-10-CM | POA: Insufficient documentation

## 2024-02-22 DIAGNOSIS — Z7985 Long-term (current) use of injectable non-insulin antidiabetic drugs: Secondary | ICD-10-CM | POA: Insufficient documentation

## 2024-02-22 DIAGNOSIS — I480 Paroxysmal atrial fibrillation: Secondary | ICD-10-CM | POA: Diagnosis not present

## 2024-02-22 DIAGNOSIS — Z86711 Personal history of pulmonary embolism: Secondary | ICD-10-CM | POA: Diagnosis not present

## 2024-02-22 DIAGNOSIS — G4733 Obstructive sleep apnea (adult) (pediatric): Secondary | ICD-10-CM | POA: Insufficient documentation

## 2024-02-22 DIAGNOSIS — I110 Hypertensive heart disease with heart failure: Secondary | ICD-10-CM | POA: Diagnosis not present

## 2024-02-22 DIAGNOSIS — F17210 Nicotine dependence, cigarettes, uncomplicated: Secondary | ICD-10-CM | POA: Diagnosis not present

## 2024-02-22 DIAGNOSIS — F419 Anxiety disorder, unspecified: Secondary | ICD-10-CM | POA: Insufficient documentation

## 2024-02-22 DIAGNOSIS — Z95810 Presence of automatic (implantable) cardiac defibrillator: Secondary | ICD-10-CM

## 2024-02-22 DIAGNOSIS — E119 Type 2 diabetes mellitus without complications: Secondary | ICD-10-CM | POA: Insufficient documentation

## 2024-02-22 DIAGNOSIS — I959 Hypotension, unspecified: Secondary | ICD-10-CM | POA: Insufficient documentation

## 2024-02-22 DIAGNOSIS — E785 Hyperlipidemia, unspecified: Secondary | ICD-10-CM | POA: Insufficient documentation

## 2024-02-22 DIAGNOSIS — F32A Depression, unspecified: Secondary | ICD-10-CM | POA: Insufficient documentation

## 2024-02-22 DIAGNOSIS — Z7901 Long term (current) use of anticoagulants: Secondary | ICD-10-CM | POA: Diagnosis not present

## 2024-02-22 DIAGNOSIS — I428 Other cardiomyopathies: Secondary | ICD-10-CM | POA: Insufficient documentation

## 2024-02-22 DIAGNOSIS — Z79899 Other long term (current) drug therapy: Secondary | ICD-10-CM | POA: Diagnosis not present

## 2024-02-22 DIAGNOSIS — E0590 Thyrotoxicosis, unspecified without thyrotoxic crisis or storm: Secondary | ICD-10-CM | POA: Insufficient documentation

## 2024-02-22 LAB — COMPREHENSIVE METABOLIC PANEL
ALT: 17 U/L (ref 0–44)
AST: 19 U/L (ref 15–41)
Albumin: 3.4 g/dL — ABNORMAL LOW (ref 3.5–5.0)
Alkaline Phosphatase: 93 U/L (ref 38–126)
Anion gap: 9 (ref 5–15)
BUN: 27 mg/dL — ABNORMAL HIGH (ref 6–20)
CO2: 20 mmol/L — ABNORMAL LOW (ref 22–32)
Calcium: 8.7 mg/dL — ABNORMAL LOW (ref 8.9–10.3)
Chloride: 107 mmol/L (ref 98–111)
Creatinine, Ser: 1.77 mg/dL — ABNORMAL HIGH (ref 0.61–1.24)
GFR, Estimated: 45 mL/min — ABNORMAL LOW (ref >60)
Glucose, Bld: 120 mg/dL — ABNORMAL HIGH (ref 70–99)
Potassium: 3.7 mmol/L (ref 3.5–5.1)
Sodium: 136 mmol/L (ref 135–145)
Total Bilirubin: 0.7 mg/dL (ref 0.0–1.2)
Total Protein: 7.4 g/dL (ref 6.5–8.1)

## 2024-02-22 LAB — TSH: TSH: 4.457 u[IU]/mL (ref 0.350–4.500)

## 2024-02-22 MED ORDER — TORSEMIDE 20 MG PO TABS: ORAL_TABLET | ORAL | 6 refills | Status: DC

## 2024-02-22 NOTE — Progress Notes (Signed)
 [facility] Note   Primary Care: Dr. Sanda Linger HF Cardiologist: Dr. Shirlee Latch   Chief complaint: Shortness of breath  HPI: [name] is a 58 y.o. male with a past medical history of NICM, EF 25-30% in March 2018, felt to be related to prior ETOH abuse. He also has a history of PE 03/2014 completed a years course of Xarelto, morbid obesity, OSA, and tobacco abuse.    He was admitted 11/12/15-11/14/15 with acute on chronic systolic CHF and palpitations. He wore a 30 day event monitor at discharge as he had frequent PVC's and questionable Afib on telemetry. Also with some NSVT, so he was started on Amiodarone, but at follow up had not started taking it. He had previously refused ICD and was seen inpatient by Dr. Johney Frame who felt that his morbid obesity was a prohibitive factor.    He was seen in the clinic in April 2018. He had started drinking ETOH again. Volume status was stable, he is not an Entresto candidate due to angioedema with lisinopril. Weight was 411 pounds.    Admitted 04/12/17 with SOB, chest pain. D- dimer was 1.16, chest CT without central obstructing PE, however more peripheral and subsegmental pulmonary artery branches were not confidently evaluated due to his body habitus. He was started on a heparin gtt for presumed PE, however VQ scan showed no PE. Troponin was elevated, peaked at 3.37. LHC showed no CAD. Echo showed an EF of 15%, grade 2 DD, no pericarditis. He was diuresed with IV lasix, and started on torsemide 20mg  at discharge. Discharge weight was 405 pounds.   Admitted 5/22 through 04/24/2018 with abdominal pain, nausea, and vomiting. 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 Did not require surgical intervention. Followed by outpatient GI.   Echo in 2/21 with EF 20-25%, severe LV dilation.   Presented 05/09/21 w/ SOB 2/2 acute CHF w ? PNA.  CTA showed multifocal ?PNA, no PE. Also in Afib w/ RVR in 150s on admit. Initially required bipap, developed hemoptysis and  intubated. Treated w/ IV Lasix. Echo 05/10/21: LVEF < 20%. RV moderately reduced. Improved and was extubated 6/13. Went into Afib w/ RVR. Refused to wear BiPAP. Developed severe agitation/hypoxia and re-intubated.  Went into VT>>VF arrest, treated w/ ACLS>>ROSC after . Developed refractory AFib again next morning requiring emergent cardioversion 05/16/21. He was transitioned to PO amiodarone. HR remained in 40-50's, metoprolol and sildenafil were stopped. Discharge weight 367 lbs.  St Jude CRT-D device placed in 2/23.  Echo 8/23 showed EF <20%, mild MR, RV normal.  CPX in 9/23 showed moderate functional limitation due to HF and body habitus.   Echo in 9/24 showed EF <20% with severe LV dilation, normal RV size/function, no significant MR.   CPX was done in 10/24, this showed only mild to moderate HF limitation.  However, RHC in 10/24 showed mildly elevated filling pressures and low cardiac index (1.69 Fick, 2.06 Thermo).    Patient returns today for followup of CHF.  Weight is up 19 lbs.   He just restarted Mounjaro and has been eating more. His ankle is less painful and he is moving better.  No dyspnea with usual activities.  No orthopnea/PND.  No chest pain. No lightheadedness. He is still smoking 1/2 ppd. Rare ETOH.   ECG (personally reviewed): NSR, BiV pacing   Labs (1/19): K 3.8 Creatinine 1.25  Labs (2/19): K 3.8 Creatinine 1.08 Labs (4/19): K 4.1 creatinine 1/15 BNP 212  Labs (5/19): creatinine 1.1  Labs (10/19): LDL  166, K 3.8, creatinine 1.01, AST 102 => 25, ALT 125 => 37, alkaline phosphatase 233 Labs (11/20): LDL 106, TGs 232 Labs (2/21): K 3.3, creatinine 1.3 Labs (4/21): LDL 114, TGs 466, K 3.6, creatinine 1.22 Labs (12/21): K 3.5, creatinine 1.08, LDL 164, TGs 133 Labs (722): K 3.8, creatinine 1.4 Labs (8/22): K 3.8, creatinine 1.23 Labs (9/22): K 3.6, creatinine 1.26 Labs (2/23): LFTs normal, TSH normal, K 4, creatinine 1.3 Labs (8/23): K 3.4, creatinine 1.49 Labs (10/23):  K 3.8, creatinine 1.72 Labs (11/23): LFTs normal, TSH normal Labs (1/24): K 3.9, creatinine 1.53 Labs (3/24): K 4.2, creatinine 1.86, LDL 65 Labs (7/24): K 3.5, creatinine 1.10, BNP 241, Hgb 14.5, Hgb A1c 5.5  Labs (8/24): transferrin saturation 22%, BNP 168, TSH < 0.01, K 3.2, creatinine 1.62, LFTs normal Labs (1/25): TSH 12.49, LDL 83, K 3.7, creatinine 1.6 => 1.79, BNP 255, hgb 15.7, AST/ALT normal Labs (2/25): TSH 6.3, free T4 normal, K 4.5, creatinine 1.56, BNP 244, hgb 15.7  PMH: 1. Nonischemic cardiomyopathy: Prior cath with no significant CAD.  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  Prior echoes with EF as low as 25%.  Echo (9/13) with EF 35-40%, moderate to severe LV dilation, diffuse hypokinesis, mild MR. Echo (5/15) with EF 30-35%, moderate to severe LAE, normal RV size and systolic function.  Angioedema with ACEI, headaches with hydralazine/nitrates. Echo (3/16) with EF 25-30%, severe LV dilation, normal RV size and systolic function.  [facility] 08/13/15 showed no significant coronary disease; RA mean 6, PA 33/11 mean 23, PCWP mean 13, Fick CO/CI 4.75 /1.68 (difficult study, radial artery spasm, if needs future cath would use groin).  Echo (9/16) showed EF 20-25%.   - Echo (3/18): EF 25-30%, moderate LAE - Echo 4/18 EF 15% - Echo 7/19 EF 20-25% - Echo 2/21 with EF 20-25%, severe LV dilation.  - Echo 6/22 with EF < 20%, normal RV. - St Jude CRT-D 2/23.  - Echo (8/23): EF < 20%, mild MR, RV normal.  - CPX (9/23): Peak VO2 13.1, VE/VCO2 slope 37, RER 1.10.  Moderate functional limitation, body habitus and HF.  - Echo (9/24): EF <20% with severe LV dilation, normal RV size/function, no significant MR.  - CPX (10/24): peak VO2 14.5, VE/VCO2 slope 33, RER 1.06.  Mild-moderate HF limitation.  - RHC (10/24): mean RA 9, PA 38/15 mean 29, mean PCWP 22, CI 1.69 Fick/2.06 thermo, PAPi 2.5.  2. HTN: angioedema with ACEI.  3. OSA: on Bipap 4. Morbid  obesity 5. Paroxysmal atrial fibrillation: Urgent DCCV 6/22. 6. Smoker.  7. Anxiety/panic attacks 8. PE: 5/15, diagnosed by V/Q scan. CTA chest 8/16 negative for PE.  9. NSVT, PVCs: 30 day monitor (12/16) with PVCs, PACs, no atrial fibrillation.  - Zio patch (9/19): few short NSVT runs, no atrial fibrillation, 1.1% PVCs 10. Hematuria: Apparently had negative workup by urology.  11. ABIs (6/16) were normal 12. 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  13. Gout 14. Low back pain.  15. 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 CKD stage 3 17. Type 2 diabetes 18. 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    SH: Separated, lives in [place], drinks ETOH occasionally, no drugs. Prior smoker.  Has son and daughter.     FH: No premature CAD.    Review of systems complete and found to be negative unless listed in HPI.   Current Outpatient Medications  Medication Sig Dispense Refill   albuterol (VENTOLIN HFA) 108 (90 Base) MCG/ACT  inhaler INHALE 1-2 PUFFS INTO THE LUNGS EVERY 4 (FOUR) HOURS AS NEEDED FOR SHORTNESS OF BREATH OR WHEEZING. 8.5 each 1   amiodarone (PACERONE) 200 MG tablet Take 0.5 tablets (100 mg total) by mouth daily. Patient taking once daily 15 tablet 6   atorvastatin (LIPITOR) 40 MG tablet Take 1 tablet (40 mg total) by mouth daily. 90 tablet 3   carvedilol (COREG) 6.25 MG tablet Take 1 tablet (6.25 mg total) by mouth 2 (two) times daily. 60 tablet 11   dapagliflozin propanediol (FARXIGA) 10 MG TABS tablet Take 1 tablet (10 mg total) by mouth daily. 90 tablet 1   methimazole (TAPAZOLE) 10 MG tablet Take 1 tablet (10 mg total) by mouth daily. (Patient taking differently: Take 5 mg by mouth daily.) 90 tablet 3   pantoprazole (PROTONIX) 40 MG tablet Take 1 tablet (40 mg total) by mouth daily. Schedule an appt for further refills 30 tablet 0   potassium chloride SA (KLOR-CON M) 20 MEQ tablet Take 2 tablets (40 mEq total) by mouth daily. (Patient taking differently: Take 20 mEq by mouth as needed.) 180  tablet 3   PRESCRIPTION MEDICATION Inhale into the lungs See admin instructions. Bipap, pressure 16/12 with 2L of O2 - use whenever sleeping     sildenafil (VIAGRA) 50 MG tablet Take 50 mg by mouth as needed for erectile dysfunction.     spironolactone (ALDACTONE) 25 MG tablet Take 1 tablet (25 mg total) by mouth at bedtime. 90 tablet 3   tirzepatide (MOUNJARO) 2.5 MG/0.5ML Pen Inject 2.5 mg into the skin once a week. 2 mL 6   traZODone (DESYREL) 50 MG tablet Take 0.5-1 tablets (25-50 mg total) by mouth at bedtime as needed for sleep. 15 tablet 0   XARELTO 20 MG TABS tablet TAKE 1 TABLET BY MOUTH DAILY WITH SUPPER. 90 tablet 0   torsemide (DEMADEX) 20 MG tablet Take 2 tablets (40 mg total) by mouth every morning 60 tablet 6   No current facility-administered medications for this encounter.   Allergies  Allergen Reactions   Ace Inhibitors Anaphylaxis and Swelling    Angioedema   Isosorbide Anaphylaxis   Bidil [Isosorb Dinitrate-Hydralazine] Other (See Comments)    headache   Digoxin And Related     Unspecified "side effects"   Buspirone Other (See Comments)    dizziness   Social History   Socioeconomic History   Marital status: Divorced    Spouse name: Not on file   Number of children: 3   Years of education: Not on file   Highest education level: Not on file  Occupational History   Occupation: DISABLED  Tobacco Use   Smoking status: Some Days    Current packs/day: 0.00    Average packs/day: 0.5 packs/day for 30.0 years (15.0 ttl pk-yrs)    Types: Cigarettes    Start date: 05/15/1990    Last attempt to quit: 05/15/2020    Years since quitting: 3.7   Smokeless tobacco: Never   Tobacco comments:    vaping - nicotine-free products  Vaping Use   Vaping status: Never Used  Substance and Sexual Activity   Alcohol use: Not Currently    Alcohol/week: 0.0 standard drinks of alcohol   Drug use: No   Sexual activity: Not Currently  Other Topics Concern   Not on file  Social  History Narrative   He smokes about a pack per day and he has been smoking since he was 58 years of age.  He drinks alcohol  occasionally, but he denies any illicit drug abuse.  He is presently on disability.    Lives with wife in a 2 story home.  Has 2 children.   Previously worked in Office manager, last worked in 1998.   Highest level of education:  11th grade      Lives with his daughter's mother, for right now.  Recently divorced. (2024).        Social Drivers of Health   Financial Resource Strain: Medium Risk (09/17/2023)   Overall Financial Resource Strain (CARDIA)    Difficulty of Paying Living Expenses: Somewhat hard  Food Insecurity: Food Insecurity Present (09/17/2023)   Hunger Vital Sign    Worried About Running Out of Food in the Last Year: Sometimes true    Ran Out of Food in the Last Year: Sometimes true  Transportation Needs: No Transportation Needs (09/17/2023)   PRAPARE - Administrator, Civil Service (Medical): No    Lack of Transportation (Non-Medical): No  Physical Activity: Inactive (09/17/2023)   Exercise Vital Sign    Days of Exercise per Week: 0 days    Minutes of Exercise per Session: 0 min  Stress: Stress Concern Present (09/17/2023)   [employer] of Occupational Health - Occupational Stress Questionnaire    Feeling of Stress : Rather much  Social Connections: Socially Isolated (09/17/2023)   Social Connection and Isolation Panel [NHANES]    Frequency of Communication with Friends and Family: More than three times a week    Frequency of Social Gatherings with Friends and Family: Never    Attends Religious Services: Never    Database administrator or Organizations: No    Attends Banker Meetings: Never    Marital Status: Divorced  Catering manager Violence: Not At Risk (11/24/2023)   Received from [facility]   HITS    Over the last 12 months how often did your partner physically hurt you?: Never    Over the last 12 months  how often did your partner insult you or talk down to you?: Never    Over the last 12 months how often did your partner threaten you with physical harm?: Never    Over the last 12 months how often did your partner scream or curse at you?: Never   Family History  Problem Relation Age of Onset   Cancer Mother        brain tumor   Hypertension Mother    Diabetes Father        Deceased, 80   Heart disease Maternal Grandmother    Hypertension Other        Family History   Stroke Other        Family History   Diabetes Other        Family History   Diabetes Daughter    BP 102/81   Pulse 70   Wt (!) 149.7 kg (330 lb)   SpO2 97%   BMI 48.73 kg/m   Wt Readings from Last 3 Encounters:  02/22/24 (!) 149.7 kg (330 lb)  12/29/23 (!) 141.1 kg (311 lb)  12/22/23 (!) 144.7 kg (319 lb)   PHYSICAL EXAM: General: NAD Neck: No JVD, no thyromegaly or thyroid nodule.  Lungs: Clear to auscultation bilaterally with normal respiratory effort. CV: Nondisplaced PMI.  Heart regular S1/S2, no S3/S4, no murmur.  No peripheral edema.  No carotid bruit.  Normal pedal pulses.  Abdomen: Soft, nontender, no hepatosplenomegaly, no distention.  Skin: Intact without lesions or  rashes.  Neurologic: Alert and oriented x 3.  Psych: Normal affect. Extremities: No clubbing or cyanosis.  HEENT: Normal.   ASSESSMENT & PLAN: 1. Atrial fibrillation: Paroyxsmal. Emergent DCCV in 6/22. He is in NSR today. - Continue amiodarone 100 mg daily. Check LFTs. Will need regular eye exam.  Seeing endocrinology for hyperthyroidism, has been on methimazole and now TSH high (recently cut back methimazole).  - Continue Xarelto.  2.  Chronic systolic CHF: Nonischemic cardiomyopathy. ?If ETOH has played a role (now drinks rarely). Echo (3/18) with EF 25-30%. Echo 7/19 and in 2/21 with EF 20-25%. Echo 6/22 with EF < 20%, normal RV.  Has St Jude CRT-D device now.  Echo-post CRT (8/23) showed EF < 20%, mild MR, RV normal.  CPX in 9/23  with moderate functional limitation due to HF and body habitus. Echo in 9/24 showed  EF <20% with severe LV dilation, normal RV size/function, no significant MR.  CPX in 10/24 showed mild to moderate HF limitation but RHC in 10/24 showed low output (1.69 thermo/2.05 Fick).  Medication titration has been limited by hypotension.  Symptoms really do not match his 10/24 RHC and seem more like the 10/24 CPX which showed less severe limitation.  NYHA class I-II symptoms at most.  Though weight is up, I think this is more caloric as he is not volume overloaded on exam.  - Continue torsemide 40 mg daily. BMET today.  He says that he is now taking his potassium regularly.  - Unable to tolerate losartan due to low BP (off).   - Continue Coreg 6.25 mg bid. BP will not tolerate titration.   - Continue spironolactone 25 mg daily. - Continue Farxiga 10 mg daily. - No Entresto, ACEI with h/o angioedema.  - Headaches with Bidil, cannot take.  - He did not tolerate digoxin.   - Insurance denied baroreceptor activation therapy.  - In the past, too heavy to consider LVAD or transplant.  He has lost a lot of weight.  He saw Dr Edwena Blow at [facility], not transplant candidate as BMI is still too high and still smoking but thought he was reasonable for LVAD.  I worry that he is nearing end-stage HF with low output on RHC and difficulty titrating meds. Currently, he seems to be in a window where LVAD would be feasible.  He was seen by Dr. Laneta Simmers and deemed and LVAD candidate.  With some worsening of renal function, we may not have a wide window.  However, his CPX showed only a mild to moderate HF limitation and his symptoms are really minimal currently (NYHA I-II). There is a definite disconnect between symptoms and RHC.  We have decided to hold off on LVAD for now but to follow him closely.  3. HTN: BP now runs low, limits GDMT.  4. 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  Has St Jude CRT-D device now. On amiodarone. No VT on  device interrogation  - continue amiodarone 100 mg daily. See discussion above regarding thyroid labs. 5. OSA: reports full compliance w/ BiPAP.  - Continue nightly. 6. PE: 04/14/2018, diagnosed by V/Q scan. Has been on Xarelto. No bleeding issues. 7. Anxiety/Panic attacks/depression: Followed by psychiatry.  Seems to be doing better.  8. Hyperlipidemia:   - Continue atorvastatin. 9. Obesity:  Weight is up, but he has restarted Mounjaro.  10. Tobacco use: Back smoking. Discussed cessation. 11. T2DM  - on Mounjaro and Farxiga 12. Hyperthyroidism: Suspect related to amiodarone.  He is now on  methimazole and TSH is elevated.  - Followed by endocrinology and cutting back on methimazole.   Followup 3 months with APP.   I spent 32 minutes reviewing data, interviewing patient and his family, and organizing the orders/followup.   Marca Ancona  02/22/2024

## 2024-02-22 NOTE — Patient Instructions (Signed)
 STOP Losartan   Labs done today, your results will be available in MyChart, we will contact you for abnormal readings.  Your physician recommends that you schedule a follow-up appointment in: 4 months.  If you have any questions or concerns before your next appointment please send [place] a message through [place] or call our office at [phone].    TO LEAVE A MESSAGE FOR THE NURSE SELECT OPTION 2, PLEASE LEAVE A MESSAGE INCLUDING: YOUR NAME DATE OF BIRTH CALL BACK NUMBER REASON FOR CALL**this is important as we prioritize the call backs  YOU WILL RECEIVE A CALL BACK THE SAME DAY AS LONG AS YOU CALL BEFORE 4:00 PM  At the [facility], you and your health needs are our priority. As part of our continuing mission to provide you with exceptional heart care, we have created designated Provider Care Teams. These Care Teams include your primary Cardiologist (physician) and Advanced Practice Providers (APPs- Physician Assistants and Nurse Practitioners) who all work together to provide you with the care you need, when you need it.   You may see any of the following providers on your designated Care Team at your next follow up: Dr Arvilla Meres Dr Marca Ancona Dr. Dorthula Nettles Dr. Clearnce Hasten Amy Filbert Schilder, NP Robbie Lis, [place] [facility] [place], [place] Brynda Peon, NP [place] Lee, NP Clarisa Kindred, NP Karle Plumber, PharmD Enos Fling, PharmD   Please be sure to bring in all your medications bottles to every appointment.    Thank you for choosing  [facility]

## 2024-02-23 NOTE — Progress Notes (Signed)
 EPIC Encounter for ICM Monitoring  Patient Name: [name] is a 58 y.o. male Date: 02/23/2024 Primary Care Physican: Etta Grandchild, MD Primary Cardiologist: Shirlee Latch Electrophysiologist: Townsend Roger Pacing: 96% 02/04/2023 Weight:  358 lbs       06/01/2023 Weight: 328 lbs (losing weight taking Mounjaro)   06/09/2023 Weight: 328 lbs             07/07/2023 Weight: 308 lbs   9/10/202 Weight: 308 lbs      09/16/2023 Weight: 317 lbs    12/29/2023 Office Weight: 311 lbs 02/22/2024 Office Weight: 330 lbs   Transmission results reviewed.     Diet:  He reports 2/11 eating foods high in salt over the last couple of weeks.   CorVue thoracic impedance suggesting fluid levels changed from possible fluid accumulation 3/8-3/17 to possible dryness starting 3/18 and trending back toward baseline.   Prescribed:  Torsemide 20 mg Take 2 tablet(s) (40 mg total) every morning. Potassium 20 mEq take 2 tablet(s) (40 mEq total) by mouth daily.   Spironolactone 25 mg take 1 tablet daily   Labs: 02/22/2024 Creatinine 1.77, BUN 27, Potassium 3.7, Sodium 136, GFR 45 01/18/2024 Creatinine 1.56, BUN 24, Potassium 4.5, Sodium 135, GFR 52  12/29/2023 Creatinine 1.79, BUN 15, Potassium 3.0, Sodium 142, GFR 44 (Prescribed potassium) 12/16/2023 Creatinine 1.61, BUN 20, Potassium 3.7, Sodium 141 12/04/2023 Creatinine 1.51, BUN 23, Potassium 3.7, Sodium 136, GFR 54 A complete set of results can be found in Results Review.   Recommendations:  Recommendations given at 02/22/2024 HF clinic OV by Dr Shirlee Latch.     Follow-up plan: ICM clinic phone appointment 03/28/2024.   91 day device clinic remote transmission 04/26/2024.     EP/Cardiology Office Visits:  06/23/2024 with HF Clinic.  Recall 08/09/2023 with Francis Dowse, PA   Copy of ICM check sent to Dr. Lalla Brothers.  3 month ICM trend: 02/22/2024.    12-14 Month ICM trend:     Karie Soda, RN 02/23/2024 3:03 PM

## 2024-02-24 ENCOUNTER — Telehealth: Payer: Self-pay

## 2024-02-24 ENCOUNTER — Telehealth: Payer: Medicare HMO | Admitting: Psychiatry

## 2024-02-24 NOTE — Progress Notes (Signed)
 This patient is appearing on a report for being at risk of failing the adherence measure for cholesterol (statin) and hypertension (ACEi/ARB) medications this calendar year.   Medication: Losartan 25mg  daily - stopped due to hypotension  Atorvastatin 40mg  daily Last fill date: 02/10/2024 for 90 day supply  No intervention needed    Verdene Rio, PharmD PGY1 Pharmacy Resident

## 2024-02-24 NOTE — Telephone Encounter (Signed)
 Opened in error

## 2024-02-29 ENCOUNTER — Encounter (HOSPITAL_COMMUNITY): Payer: Medicare HMO | Admitting: Internal Medicine

## 2024-03-02 NOTE — Progress Notes (Signed)
 Remote ICD transmission.

## 2024-03-02 NOTE — Addendum Note (Signed)
 Addended by: Geralyn Flash D on: 03/02/2024 03:19 PM   Modules accepted: Orders

## 2024-03-07 ENCOUNTER — Other Ambulatory Visit: Payer: Self-pay | Admitting: Internal Medicine

## 2024-03-07 DIAGNOSIS — K219 Gastro-esophageal reflux disease without esophagitis: Secondary | ICD-10-CM

## 2024-03-22 ENCOUNTER — Other Ambulatory Visit: Payer: Medicare HMO

## 2024-03-22 DIAGNOSIS — E0590 Thyrotoxicosis, unspecified without thyrotoxic crisis or storm: Secondary | ICD-10-CM | POA: Diagnosis not present

## 2024-03-23 ENCOUNTER — Telehealth: Payer: Self-pay | Admitting: Cardiology

## 2024-03-23 ENCOUNTER — Encounter: Payer: Self-pay | Admitting: Internal Medicine

## 2024-03-23 ENCOUNTER — Ambulatory Visit (INDEPENDENT_AMBULATORY_CARE_PROVIDER_SITE_OTHER): Admitting: Internal Medicine

## 2024-03-23 VITALS — BP 98/74 | HR 73 | Temp 98.5°F | Ht 69.0 in | Wt 334.0 lb

## 2024-03-23 DIAGNOSIS — J069 Acute upper respiratory infection, unspecified: Secondary | ICD-10-CM

## 2024-03-23 DIAGNOSIS — R051 Acute cough: Secondary | ICD-10-CM

## 2024-03-23 DIAGNOSIS — I10 Essential (primary) hypertension: Secondary | ICD-10-CM | POA: Diagnosis not present

## 2024-03-23 LAB — T4, FREE: Free T4: 1.2 ng/dL (ref 0.8–1.8)

## 2024-03-23 LAB — POC INFLUENZA A&B (BINAX/QUICKVUE)
Influenza A, POC: NEGATIVE
Influenza B, POC: NEGATIVE

## 2024-03-23 LAB — TSH: TSH: 4.13 m[IU]/L (ref 0.40–4.50)

## 2024-03-23 LAB — POC COVID19 BINAXNOW: SARS Coronavirus 2 Ag: NEGATIVE

## 2024-03-23 LAB — T3, FREE: T3, Free: 2.9 pg/mL (ref 2.3–4.2)

## 2024-03-23 MED ORDER — AMOXICILLIN-POT CLAVULANATE 875-125 MG PO TABS: 1.0000 | ORAL_TABLET | Freq: Two times a day (BID) | ORAL | 0 refills | Status: AC

## 2024-03-23 NOTE — Telephone Encounter (Signed)
 Pt would like to update his phone for the defibrillator app and was told to call our office. Please advise

## 2024-03-23 NOTE — Progress Notes (Signed)
 Subjective:    Patient ID: [name], male    DOB: 1966-01-25, 58 y.o.   MRN: [id number]      HPI Joseguadalupe is here for  Chief Complaint  Patient presents with   Cough    Cough and nasal congestion; Started Sunday with cough and sore throat    He is here for an acute visit for cold symptoms.  His symptoms started 3 days ago  He is experiencing fatigue, nasal congestion, sore throat on right side of throat, cough - dry and productive and headaches.  No SOB, wheeze, fever and sinus pain.    He has tried taking nothing.  He was not sure what he could take.      Medications and allergies reviewed with patient and updated if appropriate.  Current Outpatient Medications on File Prior to Visit  Medication Sig Dispense Refill   albuterol  (VENTOLIN  HFA) 108 (90 Base) MCG/ACT inhaler INHALE 1-2 PUFFS INTO THE LUNGS EVERY 4 (FOUR) HOURS AS NEEDED FOR SHORTNESS OF BREATH OR WHEEZING. 8.5 each 1   amiodarone  (PACERONE ) 200 MG tablet Take 0.5 tablets (100 mg total) by mouth daily. Patient taking once daily 15 tablet 6   atorvastatin  (LIPITOR) 40 MG tablet Take 1 tablet (40 mg total) by mouth daily. 90 tablet 3   carvedilol  (COREG ) 6.25 MG tablet Take 1 tablet (6.25 mg total) by mouth 2 (two) times daily. 60 tablet 11   dapagliflozin  propanediol (FARXIGA ) 10 MG TABS tablet Take 1 tablet (10 mg total) by mouth daily. 90 tablet 1   methimazole  (TAPAZOLE ) 10 MG tablet Take 1 tablet (10 mg total) by mouth daily. (Patient taking differently: Take 5 mg by mouth daily.) 90 tablet 3   pantoprazole  (PROTONIX ) 40 MG tablet TAKE 1 TABLET (40 MG TOTAL) BY MOUTH DAILY. SCHEDULE AN APPT FOR FURTHER REFILLS 90 tablet 1   potassium chloride  SA (KLOR-CON  M) 20 MEQ tablet Take 2 tablets (40 mEq total) by mouth daily. (Patient taking differently: Take 20 mEq by mouth as needed.) 180 tablet 3   PRESCRIPTION MEDICATION Inhale into the lungs See admin instructions. Bipap, pressure 16/12 with 2L of O2 -  use whenever sleeping     sildenafil  (VIAGRA ) 50 MG tablet Take 50 mg by mouth as needed for erectile dysfunction.     spironolactone  (ALDACTONE ) 25 MG tablet Take 1 tablet (25 mg total) by mouth at bedtime. 90 tablet 3   tirzepatide  (MOUNJARO ) 2.5 MG/0.5ML Pen Inject 2.5 mg into the skin once a week. 2 mL 6   torsemide  (DEMADEX ) 20 MG tablet Take 2 tablets (40 mg total) by mouth every morning 60 tablet 6   traZODone  (DESYREL ) 50 MG tablet Take 0.5-1 tablets (25-50 mg total) by mouth at bedtime as needed for sleep. 15 tablet 0   XARELTO  20 MG TABS tablet TAKE 1 TABLET BY MOUTH DAILY WITH SUPPER. 90 tablet 0   [DISCONTINUED] pravastatin  (PRAVACHOL ) 40 MG tablet Take 40 mg by mouth daily.     No current facility-administered medications on file prior to visit.    Review of Systems  Constitutional:  Positive for fatigue. Negative for fever.  HENT:  Positive for congestion, nosebleeds (right side - few episodes) and sore throat (right side). Negative for ear pain and sinus pain.   Respiratory:  Positive for cough (productive at times). Negative for chest tightness, shortness of breath and wheezing.   Gastrointestinal:  Positive for abdominal pain (epigastric).  Neurological:  Positive for headaches (mild -  right forehead and posterior head). Negative for dizziness and light-headedness.       Objective:   Vitals:   03/23/24 1459  BP: 98/74  Pulse: 73  Temp: 98.5 F (36.9 C)  SpO2: 97%   BP Readings from Last 3 Encounters:  03/23/24 98/74  02/22/24 102/81  01/20/24 (!) 140/80   Wt Readings from Last 3 Encounters:  03/23/24 (!) 334 lb (151.5 kg)  02/22/24 (!) 330 lb (149.7 kg)  12/29/23 (!) 311 lb (141.1 kg)   Body mass index is 49.32 kg/m.    Physical Exam Constitutional:      General: He is not in acute distress.    Appearance: Normal appearance. He is not ill-appearing.  HENT:     Head: Normocephalic.     Right Ear: Tympanic membrane, ear canal and external ear normal.  There is no impacted cerumen.     Left Ear: Tympanic membrane, ear canal and external ear normal. There is no impacted cerumen.     Mouth/Throat:     Mouth: Mucous membranes are moist.     Pharynx: No oropharyngeal exudate or posterior oropharyngeal erythema.  Eyes:     Conjunctiva/sclera: Conjunctivae normal.  Cardiovascular:     Rate and Rhythm: Normal rate and regular rhythm.  Pulmonary:     Effort: Pulmonary effort is normal. No respiratory distress.     Breath sounds: Normal breath sounds. No wheezing or rales.  Musculoskeletal:     Cervical back: Neck supple. No tenderness.  Lymphadenopathy:     Cervical: No cervical adenopathy.  Skin:    General: Skin is warm and dry.     Findings: No rash.  Neurological:     Mental Status: He is alert.         Covid and flu tests negative.     Assessment & Plan:    URI, acute cough Acute COVID and flu test negative here today There is some concern for bacterial infection Start Augmentin  875-125 mg twice daily x 7 days Can take Coricidin cold products and if there is any concerns he can discuss with the pharmacist Rest, fluids Call if no improvement  Hypertension: Chronic Blood pressure on the low side here today, but also has combined chronic systolic and diastolic heart failure Blood pressure looks variable BP Readings from Last 3 Encounters:  03/23/24 98/74  02/22/24 102/81  01/20/24 (!) 140/80   Following closely with cardiology so I will not make any changes Continue amiodarone  100 mg daily, carvedilol  6.25 mg twice daily, spironolactone  25 mg daily, torsemide  40 mg daily

## 2024-03-23 NOTE — Patient Instructions (Addendum)
      Medications changes include :   Augmentin  twice daily x 7 days   Use coricidin cold products for your symptoms and Mucinex .     Return if symptoms worsen or fail to improve.

## 2024-03-24 NOTE — Telephone Encounter (Signed)
 I spoke with the pt and put his new number in [employer].

## 2024-03-25 ENCOUNTER — Encounter: Payer: Self-pay | Admitting: "Endocrinology

## 2024-03-25 ENCOUNTER — Ambulatory Visit (INDEPENDENT_AMBULATORY_CARE_PROVIDER_SITE_OTHER): Payer: Medicare HMO | Admitting: "Endocrinology

## 2024-03-25 VITALS — BP 102/80 | HR 75 | Ht 69.0 in | Wt 336.0 lb

## 2024-03-25 DIAGNOSIS — Z79899 Other long term (current) drug therapy: Secondary | ICD-10-CM | POA: Diagnosis not present

## 2024-03-25 DIAGNOSIS — E0590 Thyrotoxicosis, unspecified without thyrotoxic crisis or storm: Secondary | ICD-10-CM | POA: Diagnosis not present

## 2024-03-25 MED ORDER — METHIMAZOLE 5 MG PO TABS: 5.0000 mg | ORAL_TABLET | Freq: Every day | ORAL | 1 refills | Status: DC

## 2024-03-25 NOTE — Progress Notes (Signed)
 Outpatient Endocrinology Note [name], MD  03/25/24   [name] [birth year] [id number]  Referring Provider: Arcadio Knuckles, MD Primary Care Provider: Arcadio Knuckles, MD Subjective  No chief complaint on file.   Assessment & Plan  Diagnoses and all orders for this visit:  Hyperthyroidism -     NM THYROID  MULT UPTAKE W/IMAGING; Future -     TSH -     T4, free -     T3, free  On amiodarone  therapy -     NM THYROID  MULT UPTAKE W/IMAGING; Future  Other orders -     methimazole  (TAPAZOLE ) 5 MG tablet; Take 1 tablet (5 mg total) by mouth daily.     [name] is currently taking methimazole  5 mg qd.  He is on amiodarone  100 mg every day for Atrial fibrillation, on amiodarone  since 2022. 12/22/23: TSI/TRAb -ve 03/25/24: Ordered [facility] imaging  Educated on thyroid  axis.  Recommend the following: Continue methimazole  5 mg once a day. Repeat labs now and then in 3 months or sooner if symptoms of hyper or hypothyroidism develop.  -complications of untreated hyperthyroidism include atrial fibrillation, heart failure and osteoporosis -side effects of Methimazole  include but are not limited to allergic reaction, rash, bone marrow suppression, liver dysfunction and teratogenic potential -implications in pregnancy and breastfeeding -compliance and follow up needs    Follow up with ophthalmologist  I have reviewed current medications, nurse's notes, allergies, vital signs, past medical and surgical history, family medical history, and social history for this encounter. Counseled patient on symptoms, examination findings, lab findings, imaging results, treatment decisions and monitoring and prognosis. The patient understood the recommendations and agrees with the treatment plan. All questions regarding treatment plan were fully answered.   Return in about 3 months (around 06/24/2024) for visit + labs before next visit.   [name], MD  03/25/24   I have  reviewed current medications, nurse's notes, allergies, vital signs, past medical and surgical history, family medical history, and social history for this encounter. Counseled patient on symptoms, examination findings, lab findings, imaging results, treatment decisions and monitoring and prognosis. The patient understood the recommendations and agrees with the treatment plan. All questions regarding treatment plan were fully answered.   History of Present Illness [name] is a 58 y.o. year old male who presents to our clinic with hyperthyroidism diagnosed in 07/2023.    On amiodarone   Symptoms suggestive of HYPOTHYROIDISM:  fatigue Yes weight gain No cold intolerance  No constipation  Yes, sometimes   Symptoms suggestive of HYPERTHYROIDISM:  weight loss  Yes, on mounjaro   heat intolerance No hyperdefecation  No palpitations  No has pace maker   Compressive symptoms:  dysphagia  No dysphonia  No positional dyspnea (especially with simultaneous arms elevation)  No  Smokes  Yes On biotin  No Personal history of head/neck surgery/irradiation  No  Adverse Drug Effects from Methimazole  (MMI): rash No fever No throat pain No arthritis No mouth ulcers No jaundice No loss of appetite No lymphadenopathy No  Grave's Ophthalmopathy Clinical Activity Score: 0/9  Component     Latest Ref Rng 07/30/2023 08/28/2023  TSH     0.350 - 4.500 uIU/mL <0.010 (L)    Triiodothyronine,Free,Serum     2.0 - 4.4 pg/mL  3.0   T4,Free(Direct)     0.61 - 1.12 ng/dL  2.95 (H)    28/03/1323 CT CHEST, ABDOMEN AND PELVIS WITHOUT CONTRAST  Thyroid  gland demonstrate no significant  findings.  Physical Exam  BP 102/80   Pulse 75   Ht 5\' 9"  (1.753 m)   Wt (!) 336 lb (152.4 kg)   SpO2 98%   BMI 49.62 kg/m  Constitutional: well developed, well nourished Head: normocephalic, atraumatic, no exophthalmos Eyes: sclera anicteric, no redness Neck: + thyromegaly, no thyroid  tenderness; no nodules  palpated Lungs: normal respiratory effort Neurology: alert and oriented, no fine hand tremor Skin: dry, no appreciable rashes Musculoskeletal: no appreciable defects Psychiatric: normal mood and affect  Allergies Allergies  Allergen Reactions   Ace Inhibitors Anaphylaxis and Swelling    Angioedema   Isosorbide  Anaphylaxis   Bidil [Isosorb Dinitrate-Hydralazine ] Other (See Comments)    headache   Digoxin  And Related     Unspecified "side effects"   Buspirone Other (See Comments)    dizziness    Current Medications Patient's Medications  New Prescriptions   METHIMAZOLE  (TAPAZOLE ) 5 MG TABLET    Take 1 tablet (5 mg total) by mouth daily.  Previous Medications   ALBUTEROL  (VENTOLIN  HFA) 108 (90 BASE) MCG/ACT INHALER    INHALE 1-2 PUFFS INTO THE LUNGS EVERY 4 (FOUR) HOURS AS NEEDED FOR SHORTNESS OF BREATH OR WHEEZING.   AMIODARONE  (PACERONE ) 200 MG TABLET    Take 0.5 tablets (100 mg total) by mouth daily. Patient taking once daily   AMOXICILLIN -CLAVULANATE (AUGMENTIN ) 875-125 MG TABLET    Take 1 tablet by mouth 2 (two) times daily for 7 days.   ATORVASTATIN  (LIPITOR) 40 MG TABLET    Take 1 tablet (40 mg total) by mouth daily.   CARVEDILOL  (COREG ) 6.25 MG TABLET    Take 1 tablet (6.25 mg total) by mouth 2 (two) times daily.   DAPAGLIFLOZIN  PROPANEDIOL (FARXIGA ) 10 MG TABS TABLET    Take 1 tablet (10 mg total) by mouth daily.   PANTOPRAZOLE  (PROTONIX ) 40 MG TABLET    TAKE 1 TABLET (40 MG TOTAL) BY MOUTH DAILY. SCHEDULE AN APPT FOR FURTHER REFILLS   POTASSIUM CHLORIDE  SA (KLOR-CON  M) 20 MEQ TABLET    Take 2 tablets (40 mEq total) by mouth daily.   PRESCRIPTION MEDICATION    Inhale into the lungs See admin instructions. Bipap, pressure 16/12 with 2L of O2 - use whenever sleeping   SILDENAFIL  (VIAGRA ) 50 MG TABLET    Take 50 mg by mouth as needed for erectile dysfunction.   SPIRONOLACTONE  (ALDACTONE ) 25 MG TABLET    Take 1 tablet (25 mg total) by mouth at bedtime.   TIRZEPATIDE  (MOUNJARO )  2.5 MG/0.5ML PEN    Inject 2.5 mg into the skin once a week.   TORSEMIDE  (DEMADEX ) 20 MG TABLET    Take 2 tablets (40 mg total) by mouth every morning   TRAZODONE  (DESYREL ) 50 MG TABLET    Take 0.5-1 tablets (25-50 mg total) by mouth at bedtime as needed for sleep.   XARELTO  20 MG TABS TABLET    TAKE 1 TABLET BY MOUTH DAILY WITH SUPPER.  Modified Medications   No medications on file  Discontinued Medications   METHIMAZOLE  (TAPAZOLE ) 10 MG TABLET    Take 1 tablet (10 mg total) by mouth daily.    Past Medical History Past Medical History:  Diagnosis Date   Alcohol abuse    Anxiety state, unspecified    Atrial fibrillation (HCC)    CHF (congestive heart failure) (HCC)    Chronic systolic heart failure (HCC)    Diabetes mellitus, type II (HCC)    Edema    Gout  History of medication noncompliance    Migraine    Obesity, unspecified    Obstructive sleep apnea    Psychiatric disorder    Pulmonary embolism (HCC)    Shortness of breath     Past Surgical History Past Surgical History:  Procedure Laterality Date   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   Procedure: BIV ICD INSERTION CRT-D;  Surgeon: Boyce Byes, MD;  Location: [facility] INVASIVE CV LAB;  Service: Cardiovascular;  Laterality: N/A;   CARDIAC CATHETERIZATION     CARDIAC CATHETERIZATION N/A 08/13/2015   Procedure: Right/Left Heart Cath and Coronary Angiography;  Surgeon: Darlis Eisenmenger, MD;  Location: [facility] INVASIVE CV LAB;  Service: Cardiovascular;  Laterality: N/A;   PACEMAKER INSERTION     RIGHT HEART CATH N/A 09/15/2023   Procedure: RIGHT HEART CATH;  Surgeon: Darlis Eisenmenger, MD;  Location: [facility] INVASIVE CV LAB;  Service: Cardiovascular;  Laterality: N/A;   RIGHT/LEFT HEART CATH AND CORONARY ANGIOGRAPHY N/A 04/14/2017   Procedure: Right/Left Heart Cath and Coronary Angiography;  Surgeon: Darlis Eisenmenger, MD;  Location: [facility] INVASIVE CV LAB;  Service: Cardiovascular;  Laterality: N/A;   TESTICLE SURGERY      Family  History family history includes Cancer in his mother; Diabetes in his daughter, father, and another family member; Heart disease in his maternal grandmother; Hypertension in his mother and another family member; Stroke in an other family member.  Social History Social History   Socioeconomic History   Marital status: Divorced    Spouse name: Not on file   Number of children: 3   Years of education: Not on file   Highest education level: Not on file  Occupational History   Occupation: DISABLED  Tobacco Use   Smoking status: Some Days    Current packs/day: 0.00    Average packs/day: 0.5 packs/day for 30.0 years (15.0 ttl pk-yrs)    Types: Cigarettes    Start date: 05/15/1990    Last attempt to quit: 05/15/2020    Years since quitting: 3.8   Smokeless tobacco: Never   Tobacco comments:    vaping - nicotine -free products  Vaping Use   Vaping status: Never Used  Substance and Sexual Activity   Alcohol use: Not Currently    Alcohol/week: 0.0 standard drinks of alcohol   Drug use: No   Sexual activity: Not Currently  Other Topics Concern   Not on file  Social History Narrative   He smokes about a pack per day and he has been smoking since he was 58 years of age.  He drinks alcohol occasionally, but he denies any illicit drug abuse.  He is presently on disability.    Lives with wife in a 2 story home.  Has 2 children.   Previously worked in Office manager, last worked in 1998.   Highest level of education:  11th grade      Lives with his daughter's mother, for right now.  Recently divorced. (2024).        Social Drivers of Health   Financial Resource Strain: Medium Risk (09/17/2023)   Overall Financial Resource Strain (CARDIA)    Difficulty of Paying Living Expenses: Somewhat hard  Food Insecurity: Food Insecurity Present (09/17/2023)   Hunger Vital Sign    Worried About Running Out of Food in the Last Year: Sometimes true    Ran Out of Food in the Last Year: Sometimes true   Transportation Needs: No Transportation Needs (09/17/2023)   PRAPARE - Transportation  Lack of Transportation (Medical): No    Lack of Transportation (Non-Medical): No  Physical Activity: Inactive (09/17/2023)   Exercise Vital Sign    Days of Exercise per Week: 0 days    Minutes of Exercise per Session: 0 min  Stress: Stress Concern Present (09/17/2023)   [employer] of Occupational Health - Occupational Stress Questionnaire    Feeling of Stress : Rather much  Social Connections: Socially Isolated (09/17/2023)   Social Connection and Isolation Panel [NHANES]    Frequency of Communication with Friends and Family: More than three times a week    Frequency of Social Gatherings with Friends and Family: Never    Attends Religious Services: Never    Database administrator or Organizations: No    Attends Banker Meetings: Never    Marital Status: Divorced  Catering manager Violence: Not At Risk (11/24/2023)   Received from [facility]   HITS    Over the last 12 months how often did your partner physically hurt you?: Never    Over the last 12 months how often did your partner insult you or talk down to you?: Never    Over the last 12 months how often did your partner threaten you with physical harm?: Never    Over the last 12 months how often did your partner scream or curse at you?: Never    Laboratory Investigations Lab Results  Component Value Date   TSH 4.13 03/22/2024   TSH 4.457 02/22/2024   TSH 6.339 (H) 01/18/2024   FREET4 1.2 03/22/2024   FREET4 0.93 01/18/2024   FREET4 1.1 12/22/2023     Lab Results  Component Value Date   TSI 102 12/22/2023     No components found for: "TRAB"   Lab Results  Component Value Date   CHOL 140 12/16/2023   Lab Results  Component Value Date   HDL 35 (L) 12/16/2023   Lab Results  Component Value Date   LDLCALC 83 12/16/2023   Lab Results  Component Value Date   TRIG 123 12/16/2023   Lab Results   Component Value Date   CHOLHDL 4.0 12/16/2023   Lab Results  Component Value Date   CREATININE 1.77 (H) 02/22/2024   Lab Results  Component Value Date   GFR 40.56 (L) 02/06/2023      Component Value Date/Time   NA 136 02/22/2024 1018   K 3.7 02/22/2024 1018   CL 107 02/22/2024 1018   CO2 20 (L) 02/22/2024 1018   GLUCOSE 120 (H) 02/22/2024 1018   BUN 27 (H) 02/22/2024 1018   CREATININE 1.77 (H) 02/22/2024 1018   CREATININE 1.61 (H) 12/16/2023 1012   CALCIUM  8.7 (L) 02/22/2024 1018   PROT 7.4 02/22/2024 1018   ALBUMIN  3.4 (L) 02/22/2024 1018   AST 19 02/22/2024 1018   ALT 17 02/22/2024 1018   ALKPHOS 93 02/22/2024 1018   BILITOT 0.7 02/22/2024 1018   GFRNONAA 45 (L) 02/22/2024 1018   GFRAA >60 05/15/2020 1452      Latest Ref Rng & Units 02/22/2024   10:18 AM 01/18/2024    2:50 PM 12/29/2023   11:11 AM  BMP  Glucose 70 - 99 mg/dL 119  95  80   BUN 6 - 20 mg/dL 27  24  15    Creatinine 0.61 - 1.24 mg/dL 1.47  8.29  5.62   Sodium 135 - 145 mmol/L 136  135  142   Potassium 3.5 - 5.1 mmol/L 3.7  4.5  3.0   Chloride 98 - 111 mmol/L 107  102  100   CO2 22 - 32 mmol/L 20  23  25    Calcium  8.9 - 10.3 mg/dL 8.7  9.3  9.1        Component Value Date/Time   WBC 6.7 12/29/2023 1111   RBC 5.32 12/29/2023 1111   HGB 15.7 12/29/2023 1111   HCT 43.5 12/29/2023 1111   PLT 262 12/29/2023 1111   MCV 81.8 12/29/2023 1111   MCH 29.5 12/29/2023 1111   MCHC 36.1 (H) 12/29/2023 1111   RDW 13.9 12/29/2023 1111   LYMPHSABS 3.3 09/29/2023 0938   MONOABS 0.6 09/29/2023 0938   EOSABS 0.9 (H) 09/29/2023 0938   BASOSABS 0.1 09/29/2023 0938      Parts of this note may have been dictated using voice recognition software. There may be variances in spelling and vocabulary which are unintentional. Not all errors are proofread. Please notify the [facility] if any discrepancies are noted or if the meaning of any statement is not clear.

## 2024-03-28 ENCOUNTER — Ambulatory Visit: Attending: Cardiology

## 2024-03-28 DIAGNOSIS — I5042 Chronic combined systolic (congestive) and diastolic (congestive) heart failure: Secondary | ICD-10-CM | POA: Diagnosis not present

## 2024-03-28 DIAGNOSIS — Z95810 Presence of automatic (implantable) cardiac defibrillator: Secondary | ICD-10-CM

## 2024-03-30 NOTE — Progress Notes (Signed)
 EPIC Encounter for ICM Monitoring  Patient Name: [name] is a 58 y.o. male Date: 03/30/2024 Primary Care Physican: Arcadio Knuckles, MD Primary Cardiologist: Mitzie Anda Electrophysiologist: Kasandra Pain Pacing: 96% 02/04/2023 Weight:  358 lbs       06/01/2023 Weight: 328 lbs (losing weight taking Mounjaro )   06/09/2023 Weight: 328 lbs             07/07/2023 Weight: 308 lbs   9/10/202 Weight: 308 lbs      09/16/2023 Weight: 317 lbs    12/29/2023 Office Weight: 311 lbs 02/22/2024 Office Weight: 330 lbs 03/30/2024 Weight: 332 lbs (was up to 336 lbs)   Spoke with patient and heart failure questions reviewed.  Transmission results reviewed.  Pt asymptomatic for fluid accumulation.  He has been feeling really thirsty the last couple of days.    He was out of town from 4/13-4/21 which correlates with decreased impedance.  Encouraged morning daily weights for accuracy to monitor fluid weight gain.   Diet:  He reports he stays thirsty and drinks a lot of fluids.     CorVue thoracic impedance suggesting fluid levels changed from possible fluid accumulation from 4/13-4/24 to possible dryness starting 4/26. .   Prescribed:  Torsemide  20 mg Take 2 tablet(s) (40 mg total) every morning. Potassium 20 mEq take 2 tablet(s) (40 mEq total) by mouth daily.   Spironolactone  25 mg take 1 tablet daily   Labs: 02/22/2024 Creatinine 1.77, BUN 27, Potassium 3.7, Sodium 136, GFR 45 01/18/2024 Creatinine 1.56, BUN 24, Potassium 4.5, Sodium 135, GFR 52  12/29/2023 Creatinine 1.79, BUN 15, Potassium 3.0, Sodium 142, GFR 44 (Prescribed potassium) 12/16/2023 Creatinine 1.61, BUN 20, Potassium 3.7, Sodium 141 12/04/2023 Creatinine 1.51, BUN 23, Potassium 3.7, Sodium 136, GFR 54 A complete set of results can be found in Results Review.   Recommendations:  Advised to drink more fluids this evening and tomorrow 64-70 oz.  Will send report on 5/2 to recheck fluid levels.   Follow-up plan: ICM clinic phone appointment  04/01/2024 to recheck fluid levels.   91 day device clinic remote transmission 04/26/2024.     EP/Cardiology Office Visits:  06/23/2024 with HF Clinic.  Recall 08/09/2023 with Mertha Abrahams, PA   Copy of ICM check sent to Dr. Marven Slimmer.  3 month ICM trend: 03/30/2024.    12-14 Month ICM trend:     Almyra Jain, RN 03/30/2024 4:27 PM

## 2024-04-01 ENCOUNTER — Ambulatory Visit: Attending: Cardiology

## 2024-04-01 DIAGNOSIS — Z95810 Presence of automatic (implantable) cardiac defibrillator: Secondary | ICD-10-CM

## 2024-04-01 DIAGNOSIS — I5042 Chronic combined systolic (congestive) and diastolic (congestive) heart failure: Secondary | ICD-10-CM

## 2024-04-01 NOTE — Progress Notes (Signed)
 Attempted call to patient and no answer.  Will follow up on 5/5.

## 2024-04-01 NOTE — Progress Notes (Signed)
 EPIC Encounter for ICM Monitoring  Patient Name: [name] is a 58 y.o. male Date: 04/01/2024 Primary Care Physican: Arcadio Knuckles, MD Primary Cardiologist: Mitzie Anda Electrophysiologist: Kasandra Pain Pacing: 96% 02/04/2023 Weight:  358 lbs       06/01/2023 Weight: 328 lbs (losing weight taking Mounjaro )   06/09/2023 Weight: 328 lbs             07/07/2023 Weight: 308 lbs   9/10/202 Weight: 308 lbs      09/16/2023 Weight: 317 lbs    12/29/2023 Office Weight: 311 lbs 02/22/2024 Office Weight: 330 lbs 03/30/2024 Weight: 332 lbs (was up to 336 lbs) 04/01/2024 Weight:  331 lbs   Spoke with patient and heart failure questions reviewed.  Transmission results reviewed.  Pt asymptomatic for fluid accumulation or dryness.  Pt reports he stays very thirsty and held Torsemide  dosage on 5/1 due to he was not home all day.     Diet:  He reports he stays thirsty and drinks a lot of fluids.     CorVue thoracic impedance suggesting possible dryness starting 4/26 but returning toward baseline after holding Torsemide  5/1 and increasing fluid intake. .   Prescribed:  Torsemide  20 mg Take 2 tablet(s) (40 mg total) every morning. Potassium 20 mEq take 2 tablet(s) (40 mEq total) by mouth daily.   Spironolactone  25 mg take 1 tablet daily   Labs: 02/22/2024 Creatinine 1.77, BUN 27, Potassium 3.7, Sodium 136, GFR 45 01/18/2024 Creatinine 1.56, BUN 24, Potassium 4.5, Sodium 135, GFR 52  12/29/2023 Creatinine 1.79, BUN 15, Potassium 3.0, Sodium 142, GFR 44 (Prescribed potassium) 12/16/2023 Creatinine 1.61, BUN 20, Potassium 3.7, Sodium 141 12/04/2023 Creatinine 1.51, BUN 23, Potassium 3.7, Sodium 136, GFR 54 A complete set of results can be found in Results Review.   Recommendations: Pt held Torsemide  dosage on 5/1 and increased fluid intake. Advised to drink 65-75 oz fluid today.   Copy sent to Dr Mitzie Anda for review and recommendations if needed.     Follow-up plan: ICM clinic phone appointment 04/04/2024 to recheck  fluid levels.   91 day device clinic remote transmission 04/26/2024.     EP/Cardiology Office Visits:  06/23/2024 with HF Clinic.  Recall 08/09/2023 with Mertha Abrahams, PA   Copy of ICM check sent to Dr. Marven Slimmer.  3 month ICM trend: 04/01/2024.    12-14 Month ICM trend:     Almyra Jain, RN 04/01/2024 9:53 AM

## 2024-04-04 ENCOUNTER — Ambulatory Visit: Attending: Cardiology

## 2024-04-04 DIAGNOSIS — Z95810 Presence of automatic (implantable) cardiac defibrillator: Secondary | ICD-10-CM

## 2024-04-04 DIAGNOSIS — I5042 Chronic combined systolic (congestive) and diastolic (congestive) heart failure: Secondary | ICD-10-CM

## 2024-04-04 NOTE — Progress Notes (Signed)
 EPIC Encounter for ICM Monitoring  Patient Name: [name] is a 58 y.o. male Date: 04/04/2024 Primary Care Physican: Arcadio Knuckles, MD Primary Cardiologist: Mitzie Anda Electrophysiologist: Kasandra Pain Pacing: 96% 02/04/2023 Weight:  358 lbs       06/01/2023 Weight: 328 lbs (losing weight taking Mounjaro )   06/09/2023 Weight: 328 lbs             07/07/2023 Weight: 308 lbs   9/10/202 Weight: 308 lbs      09/16/2023 Weight: 317 lbs    12/29/2023 Office Weight: 311 lbs 02/22/2024 Office Weight: 330 lbs 03/30/2024 Weight: 332 lbs (was up to 336 lbs) 04/01/2024 Weight:  331 lbs   Spoke with patient and heart failure questions reviewed.  Transmission results reviewed.  Pt asymptomatic for fluid accumulation or dryness.     Diet:  He reports he stays thirsty and drinks a lot of fluids.     CorVue thoracic impedance suggesting fluid levels have returned to normal after holding 2 dosages of Torsemide . .   Prescribed:  Torsemide  20 mg Take 2 tablet(s) (40 mg total) every morning. Potassium 20 mEq take 2 tablet(s) (40 mEq total) by mouth daily.   Spironolactone  25 mg take 1 tablet daily   Labs: 02/22/2024 Creatinine 1.77, BUN 27, Potassium 3.7, Sodium 136, GFR 45 01/18/2024 Creatinine 1.56, BUN 24, Potassium 4.5, Sodium 135, GFR 52  12/29/2023 Creatinine 1.79, BUN 15, Potassium 3.0, Sodium 142, GFR 44 (Prescribed potassium) 12/16/2023 Creatinine 1.61, BUN 20, Potassium 3.7, Sodium 141 12/04/2023 Creatinine 1.51, BUN 23, Potassium 3.7, Sodium 136, GFR 54 A complete set of results can be found in Results Review.   Recommendations:  Recommended to be consistent with fluid intake of 64 oz fluid daily and take prescribed Torsemide  dosage.   Follow-up plan: ICM clinic phone appointment 05/02/2024.   91 day device clinic remote transmission 04/26/2024.     EP/Cardiology Office Visits:  06/23/2024 with HF Clinic.  Recall 08/09/2023 with Mertha Abrahams, PA   Copy of ICM check sent to Dr. Marven Slimmer.  3 month ICM  trend: 04/03/2024.    12-14 Month ICM trend:     Almyra Jain, RN 04/04/2024 10:21 AM

## 2024-04-07 ENCOUNTER — Ambulatory Visit (HOSPITAL_COMMUNITY)
Admission: RE | Admit: 2024-04-07 | Discharge: 2024-04-07 | Disposition: A | Discharge status: Home / Self Care | Source: Ambulatory Visit | Attending: "Endocrinology | Admitting: "Endocrinology

## 2024-04-07 DIAGNOSIS — Z79899 Other long term (current) drug therapy: Secondary | ICD-10-CM | POA: Diagnosis present

## 2024-04-07 DIAGNOSIS — E0590 Thyrotoxicosis, unspecified without thyrotoxic crisis or storm: Secondary | ICD-10-CM | POA: Insufficient documentation

## 2024-04-07 MED ORDER — SODIUM IODIDE I-123 7.4 MBQ CAPS
450.0000 | ORAL_CAPSULE | Freq: Once | ORAL | Status: AC
  Administered 2024-04-07: 450 via ORAL

## 2024-04-08 ENCOUNTER — Ambulatory Visit (HOSPITAL_COMMUNITY)
Admission: RE | Admit: 2024-04-08 | Discharge: 2024-04-08 | Disposition: A | Discharge status: Home / Self Care | Source: Ambulatory Visit | Attending: "Endocrinology | Admitting: "Endocrinology

## 2024-04-08 DIAGNOSIS — E0590 Thyrotoxicosis, unspecified without thyrotoxic crisis or storm: Secondary | ICD-10-CM | POA: Diagnosis not present

## 2024-04-13 ENCOUNTER — Telehealth (HOSPITAL_COMMUNITY): Payer: Self-pay | Admitting: Cardiology

## 2024-04-13 NOTE — Telephone Encounter (Signed)
 Pt called to report he is unable to see denitst/oral surgeon until 05/2024. Report pus-like drainage at gumline. ] Reports increased concerns for infections with recent ICD implant and LVAD work up.  Pt would like to know where he should go next? Or if he will need abx?   Advised to contact PCP for abx and if clearance is needed for any dental procedures, dental office can send over clearance. Could also try urgent care   Pt requests providers input as well.

## 2024-04-14 NOTE — Telephone Encounter (Signed)
 Pt aware Reports he was seen by dentist 5/15 and given abx, will schedule dental extraction later
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
## 2024-04-20 ENCOUNTER — Telehealth (INDEPENDENT_AMBULATORY_CARE_PROVIDER_SITE_OTHER): Admitting: Psychiatry

## 2024-04-20 ENCOUNTER — Encounter: Payer: Self-pay | Admitting: Psychiatry

## 2024-04-20 DIAGNOSIS — F410 Panic disorder [episodic paroxysmal anxiety] without agoraphobia: Secondary | ICD-10-CM

## 2024-04-20 DIAGNOSIS — F3175 Bipolar disorder, in partial remission, most recent episode depressed: Secondary | ICD-10-CM | POA: Diagnosis not present

## 2024-04-20 DIAGNOSIS — G4700 Insomnia, unspecified: Secondary | ICD-10-CM | POA: Diagnosis not present

## 2024-04-20 DIAGNOSIS — F4310 Post-traumatic stress disorder, unspecified: Secondary | ICD-10-CM

## 2024-04-26 ENCOUNTER — Ambulatory Visit (INDEPENDENT_AMBULATORY_CARE_PROVIDER_SITE_OTHER): Payer: Medicare HMO

## 2024-04-26 DIAGNOSIS — I5022 Chronic systolic (congestive) heart failure: Secondary | ICD-10-CM | POA: Diagnosis not present

## 2024-04-26 DIAGNOSIS — G4733 Obstructive sleep apnea (adult) (pediatric): Secondary | ICD-10-CM | POA: Diagnosis not present

## 2024-04-26 DIAGNOSIS — I428 Other cardiomyopathies: Secondary | ICD-10-CM

## 2024-04-27 ENCOUNTER — Ambulatory Visit: Admitting: Professional Counselor

## 2024-04-27 LAB — CUP PACEART REMOTE DEVICE CHECK
Battery Remaining Longevity: 56 mo
Battery Remaining Percentage: 65 %
Battery Voltage: 2.96 V
Brady Statistic AP VP Percent: 70 %
Brady Statistic AP VS Percent: 2.6 %
Brady Statistic AS VP Percent: 25 %
Brady Statistic AS VS Percent: 1 %
Brady Statistic RA Percent Paced: 72 %
Date Time Interrogation Session: 20250526222717
HighPow Impedance: 79 Ohm
Implantable Lead Connection Status: 753985
Implantable Lead Connection Status: 753985
Implantable Lead Connection Status: 753985
Implantable Lead Implant Date: 20230213
Implantable Lead Implant Date: 20230213
Implantable Lead Implant Date: 20230213
Implantable Lead Location: 753857
Implantable Lead Location: 753859
Implantable Lead Location: 753860
Implantable Pulse Generator Implant Date: 20230213
Lead Channel Impedance Value: 1125 Ohm
Lead Channel Impedance Value: 430 Ohm
Lead Channel Impedance Value: 460 Ohm
Lead Channel Pacing Threshold Amplitude: 0.75 V
Lead Channel Pacing Threshold Amplitude: 0.75 V
Lead Channel Pacing Threshold Amplitude: 0.75 V
Lead Channel Pacing Threshold Pulse Width: 0.5 ms
Lead Channel Pacing Threshold Pulse Width: 0.5 ms
Lead Channel Pacing Threshold Pulse Width: 0.5 ms
Lead Channel Sensing Intrinsic Amplitude: 11.8 mV
Lead Channel Sensing Intrinsic Amplitude: 2.8 mV
Lead Channel Setting Pacing Amplitude: 1.5 V
Lead Channel Setting Pacing Amplitude: 2 V
Lead Channel Setting Pacing Amplitude: 2.5 V
Lead Channel Setting Pacing Pulse Width: 0.5 ms
Lead Channel Setting Pacing Pulse Width: 0.5 ms
Lead Channel Setting Sensing Sensitivity: 0.5 mV
Pulse Gen Serial Number: 210000080
Zone Setting Status: 755011

## 2024-05-02 ENCOUNTER — Other Ambulatory Visit (HOSPITAL_COMMUNITY): Payer: Self-pay

## 2024-05-02 ENCOUNTER — Ambulatory Visit: Payer: Self-pay | Admitting: Cardiology

## 2024-05-02 ENCOUNTER — Encounter: Payer: Self-pay | Admitting: Pharmacist Clinician (PhC)/ Clinical Pharmacy Specialist

## 2024-05-02 ENCOUNTER — Ambulatory Visit: Attending: Cardiology

## 2024-05-02 DIAGNOSIS — I5042 Chronic combined systolic (congestive) and diastolic (congestive) heart failure: Secondary | ICD-10-CM | POA: Diagnosis not present

## 2024-05-02 DIAGNOSIS — Z95810 Presence of automatic (implantable) cardiac defibrillator: Secondary | ICD-10-CM | POA: Diagnosis not present

## 2024-05-03 ENCOUNTER — Telehealth: Payer: Self-pay

## 2024-05-03 NOTE — Progress Notes (Signed)
 EPIC Encounter for ICM Monitoring  Patient Name: [name] is a 58 y.o. male Date: 05/03/2024 Primary Care Physican: Arcadio Knuckles, MD Primary Cardiologist: Mitzie Anda Electrophysiologist: Kasandra Pain Pacing: 96% 02/04/2023 Weight:  358 lbs       06/01/2023 Weight: 328 lbs (losing weight taking Mounjaro )   06/09/2023 Weight: 328 lbs             07/07/2023 Weight: 308 lbs   9/10/202 Weight: 308 lbs      09/16/2023 Weight: 317 lbs    12/29/2023 Office Weight: 311 lbs 02/22/2024 Office Weight: 330 lbs 03/30/2024 Weight: 332 lbs (was up to 336 lbs) 04/01/2024 Weight:  331 lbs   Attempted call to patient and unable to reach.   Transmission results reviewed.      Diet:  He reports he stays thirsty and drinks a lot of fluids.     CorVue thoracic impedance suggesting normal fluid levels within the last month. .   Prescribed:  Torsemide  20 mg Take 2 tablet(s) (40 mg total) every morning. Potassium 20 mEq take 2 tablet(s) (40 mEq total) by mouth daily.   Spironolactone  25 mg take 1 tablet daily   Labs: 02/22/2024 Creatinine 1.77, BUN 27, Potassium 3.7, Sodium 136, GFR 45 01/18/2024 Creatinine 1.56, BUN 24, Potassium 4.5, Sodium 135, GFR 52  12/29/2023 Creatinine 1.79, BUN 15, Potassium 3.0, Sodium 142, GFR 44 (Prescribed potassium) 12/16/2023 Creatinine 1.61, BUN 20, Potassium 3.7, Sodium 141 12/04/2023 Creatinine 1.51, BUN 23, Potassium 3.7, Sodium 136, GFR 54 A complete set of results can be found in Results Review.   Recommendations:  Unable to reach.     Follow-up plan: ICM clinic phone appointment 06/20/2024.   91 day device clinic remote transmission 07/26/2024.     EP/Cardiology Office Visits:  06/23/2024 with HF Clinic.  Recall 08/09/2023 with Mertha Abrahams, PA   Copy of ICM check sent to Dr. Marven Slimmer.  3 month ICM trend: 05/01/2024.    12-14 Month ICM trend:     Almyra Jain, RN 05/03/2024 4:03 PM

## 2024-05-03 NOTE — Telephone Encounter (Signed)
 Remote ICM transmission received.  Attempted call to patient regarding ICM remote transmission and no answer.

## 2024-05-05 ENCOUNTER — Telehealth (HOSPITAL_COMMUNITY): Payer: Self-pay

## 2024-05-05 NOTE — Telephone Encounter (Signed)
  ADVANCED HEART FAILURE CLINIC   Pre-operative Risk Assessment    Request for Surgical Clearance{  Procedure:  Cleaning, deep or simple, filling, crown, bridge { Date of Surgery:  Clearance TBD                               { Surgeon:  [facility]  Surgeon's Group or Practice Name:  [facility]  Phone number:  [phone] Fax number:  [phone] { Type of Clearance Requested:   - Medical  - Pharmacy:  Hold Rivaroxaban  (Xarelto )   { Type of Anesthesia:  Local  with epinepherine  Signed, Louden Houseworth B Sherryll Skoczylas   05/05/2024, 12:40 PM

## 2024-05-06 NOTE — Telephone Encounter (Signed)
 Copy of clearance with provider recommendations faxed to requesting office via Epic fax function.

## 2024-05-21 ENCOUNTER — Encounter (HOSPITAL_COMMUNITY): Payer: Self-pay

## 2024-05-21 ENCOUNTER — Emergency Department (HOSPITAL_COMMUNITY)
Admission: EM | Admit: 2024-05-21 | Discharge: 2024-05-21 | Disposition: A | Discharge status: Home / Self Care | Attending: Emergency Medicine | Admitting: Emergency Medicine

## 2024-05-21 ENCOUNTER — Emergency Department (HOSPITAL_COMMUNITY)

## 2024-05-21 ENCOUNTER — Other Ambulatory Visit: Payer: Self-pay

## 2024-05-21 DIAGNOSIS — Z743 Need for continuous supervision: Secondary | ICD-10-CM | POA: Diagnosis not present

## 2024-05-21 DIAGNOSIS — N281 Cyst of kidney, acquired: Secondary | ICD-10-CM | POA: Diagnosis not present

## 2024-05-21 DIAGNOSIS — K8020 Calculus of gallbladder without cholecystitis without obstruction: Secondary | ICD-10-CM | POA: Diagnosis not present

## 2024-05-21 DIAGNOSIS — R1032 Left lower quadrant pain: Secondary | ICD-10-CM | POA: Diagnosis not present

## 2024-05-21 DIAGNOSIS — I509 Heart failure, unspecified: Secondary | ICD-10-CM | POA: Diagnosis not present

## 2024-05-21 DIAGNOSIS — M79605 Pain in left leg: Secondary | ICD-10-CM | POA: Diagnosis not present

## 2024-05-21 DIAGNOSIS — M5442 Lumbago with sciatica, left side: Secondary | ICD-10-CM | POA: Insufficient documentation

## 2024-05-21 DIAGNOSIS — M48061 Spinal stenosis, lumbar region without neurogenic claudication: Secondary | ICD-10-CM | POA: Diagnosis not present

## 2024-05-21 DIAGNOSIS — N433 Hydrocele, unspecified: Secondary | ICD-10-CM | POA: Diagnosis not present

## 2024-05-21 DIAGNOSIS — E119 Type 2 diabetes mellitus without complications: Secondary | ICD-10-CM | POA: Insufficient documentation

## 2024-05-21 DIAGNOSIS — M545 Low back pain, unspecified: Secondary | ICD-10-CM | POA: Diagnosis present

## 2024-05-21 DIAGNOSIS — M47816 Spondylosis without myelopathy or radiculopathy, lumbar region: Secondary | ICD-10-CM | POA: Diagnosis not present

## 2024-05-21 DIAGNOSIS — M5126 Other intervertebral disc displacement, lumbar region: Secondary | ICD-10-CM | POA: Diagnosis not present

## 2024-05-21 DIAGNOSIS — M4807 Spinal stenosis, lumbosacral region: Secondary | ICD-10-CM | POA: Diagnosis not present

## 2024-05-21 LAB — CBC
HCT: 40 % (ref 39.0–52.0)
Hemoglobin: 14 g/dL (ref 13.0–17.0)
MCH: 28.6 pg (ref 26.0–34.0)
MCHC: 35 g/dL (ref 30.0–36.0)
MCV: 81.6 fL (ref 80.0–100.0)
Platelets: 264 10*3/uL (ref 150–400)
RBC: 4.9 MIL/uL (ref 4.22–5.81)
RDW: 14.6 % (ref 11.5–15.5)
WBC: 9.4 10*3/uL (ref 4.0–10.5)
nRBC: 0 % (ref 0.0–0.2)

## 2024-05-21 LAB — URINALYSIS, ROUTINE W REFLEX MICROSCOPIC
Bacteria, UA: NONE SEEN
Bilirubin Urine: NEGATIVE
Glucose, UA: >=500 mg/dL — AB
Hgb urine dipstick: NEGATIVE
Ketones, ur: NEGATIVE mg/dL
Leukocytes,Ua: NEGATIVE
Nitrite: NEGATIVE
Protein, ur: NEGATIVE mg/dL
Specific Gravity, Urine: 1.031 — ABNORMAL HIGH (ref 1.005–1.030)
pH: 5 (ref 5.0–8.0)

## 2024-05-21 LAB — COMPREHENSIVE METABOLIC PANEL WITH GFR
ALT: 14 U/L (ref 0–44)
AST: 19 U/L (ref 15–41)
Albumin: 3 g/dL — ABNORMAL LOW (ref 3.5–5.0)
Alkaline Phosphatase: 93 U/L (ref 38–126)
Anion gap: 8 (ref 5–15)
BUN: 19 mg/dL (ref 6–20)
CO2: 21 mmol/L — ABNORMAL LOW (ref 22–32)
Calcium: 8.6 mg/dL — ABNORMAL LOW (ref 8.9–10.3)
Chloride: 106 mmol/L (ref 98–111)
Creatinine, Ser: 1.33 mg/dL — ABNORMAL HIGH (ref 0.61–1.24)
GFR, Estimated: >60 mL/min (ref >60)
Glucose, Bld: 122 mg/dL — ABNORMAL HIGH (ref 70–99)
Potassium: 4.1 mmol/L (ref 3.5–5.1)
Sodium: 135 mmol/L (ref 135–145)
Total Bilirubin: 1.1 mg/dL (ref 0.0–1.2)
Total Protein: 7 g/dL (ref 6.5–8.1)

## 2024-05-21 LAB — LIPASE, BLOOD: Lipase: 48 U/L (ref 11–51)

## 2024-05-21 MED ORDER — KETOROLAC TROMETHAMINE 15 MG/ML IJ SOLN
15.0000 mg | Freq: Once | INTRAMUSCULAR | Status: AC
  Administered 2024-05-21: 15 mg via INTRAVENOUS
  Filled 2024-05-21: qty 1

## 2024-05-21 MED ORDER — IOHEXOL 350 MG/ML SOLN
75.0000 mL | Freq: Once | INTRAVENOUS | Status: AC | PRN
  Administered 2024-05-21: 75 mL via INTRAVENOUS

## 2024-05-21 MED ORDER — HYDROCODONE-ACETAMINOPHEN 5-325 MG PO TABS: 1.0000 | ORAL_TABLET | ORAL | 0 refills | Status: DC | PRN

## 2024-05-21 MED ORDER — MORPHINE SULFATE (PF) 4 MG/ML IV SOLN
6.0000 mg | Freq: Once | INTRAVENOUS | Status: AC
  Administered 2024-05-21: 6 mg via INTRAVENOUS
  Filled 2024-05-21: qty 2

## 2024-05-21 MED ORDER — PREDNISONE 20 MG PO TABS
60.0000 mg | ORAL_TABLET | Freq: Once | ORAL | Status: AC
  Administered 2024-05-21: 60 mg via ORAL
  Filled 2024-05-21: qty 3

## 2024-05-21 MED ORDER — OXYCODONE-ACETAMINOPHEN 5-325 MG PO TABS
1.0000 | ORAL_TABLET | ORAL | Status: AC
  Administered 2024-05-21: 1 via ORAL
  Filled 2024-05-21: qty 1

## 2024-05-21 MED ORDER — HYDROCODONE-ACETAMINOPHEN 5-325 MG PO TABS
1.0000 | ORAL_TABLET | Freq: Once | ORAL | Status: AC
  Administered 2024-05-21: 1 via ORAL
  Filled 2024-05-21: qty 1

## 2024-05-21 MED ORDER — PREDNISONE 10 MG PO TABS: 40.0000 mg | ORAL_TABLET | Freq: Every day | ORAL | 0 refills | Status: AC

## 2024-05-21 MED ORDER — LIDOCAINE 5 % EX PTCH
1.0000 | MEDICATED_PATCH | CUTANEOUS | Status: DC
  Administered 2024-05-21: 1 via TRANSDERMAL
  Filled 2024-05-21: qty 1

## 2024-05-21 NOTE — ED Provider Notes (Signed)
 Accepted handoff at shift change from Medford Lites, PA-C. Please see prior provider note for more detail.   Briefly: Patient is 58 y.o. presenting for left growing, flank and lower back pain  DDX: concern for nephrolithiasis versus pyelonephritis versus lumbar strain   Plan: CT abdomen/pelvis, L-spine and ultrasound of the scrotum are pending   Physical Exam  BP (!) 113/99   Pulse 77   Temp 98.3 F (36.8 C) (Oral)   Resp 17   Ht 5' 9 (1.753 m)   Wt (!) 152.4 kg   SpO2 100%   BMI 49.62 kg/m   Physical Exam  Procedures  Procedures  ED Course / MDM   Clinical Course as of 05/21/24 0838  Sat May 21, 2024  0632 Flank pain/groin pain. H/o testicular torsion. CT scans are pending. Can be dc if ok.  [JR]    Clinical Course User Index [JR] Lang Norleen POUR, PA-C   Medical Decision Making Amount and/or Complexity of Data Reviewed Labs: ordered. Radiology: ordered.  Risk Prescription drug management.   CT L-spine revealed: 1. Advanced facet hypertrophy and broad-based disc protrusion at L4-5 with  moderate central and severe bilateral foraminal stenosis, similar to the prior  study.  2. Severe right and moderate left facet hypertrophy and spurring at L5-S1 with  severe right and moderate left foraminal stenosis. No significant disc  protrusion.  No acute findings  CT ab/pelvis revealed cholelithiasis without acute cholecystitis and ultrasound was negative for torsion  Discussed findings with patient.  Have low suspicion for acute gallbladder pathology given no upper abdominal tenderness and liver enzymes and lipase are within normal limits and CT scan does not suggest inflammation of the gallbladder.  Also considered cauda equina but unlikely given lack of red flag symptoms.  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 Suspect lumbar radiculopathy. Gave Prednisone  and Toradol  and morphine .  On  reassessment was ambulatory with assistance and stating his pain was improved.  Advised that he follow-up with neurosurgery.  Started him on a 5-day course of prednisone , a few tablets of Norco and advised Tylenol  ibuprofen as well.  Discussed return precautions.  Discharged.         Lang Norleen POUR, PA-C 05/21/24 1137    Kommor, Lum, MD 05/21/24 1139

## 2024-05-21 NOTE — Discharge Instructions (Addendum)
 Evaluation today revealed you have some stenosis in your lumbar spine this can cause pinching to the nerve roots coming out of the lower spine.  I have sent a 4-day course of prednisone  and Norco.  Please follow-up with neurosurgery.  If you develop numbness or weakness in your legs, urinary incontinence or retention, fever or any new trauma to your back please return to the ED for further evaluation.

## 2024-05-21 NOTE — ED Notes (Signed)
 As per EMS pt got Fentanyl  1103mcg/IV

## 2024-05-21 NOTE — ED Notes (Signed)
 Pt is unable to ambulate. Pt unable to move L leg without extreme pain

## 2024-05-21 NOTE — ED Triage Notes (Signed)
 Pt BIBEMS form home came to ED c/o left groin and left flank pain started yesterday morning

## 2024-05-21 NOTE — ED Provider Notes (Signed)
 [place] EMERGENCY DEPARTMENT AT [facility] Provider Note   CSN: [id number] Arrival date & time: 05/21/24  0532     Patient presents with: Groin Pain and Flank Pain   [name] is a 58 y.o. male with medical history to include alcohol abuse, atrial fibrillation, CHF, diabetes, gout, obesity.  Patient presents to ED for evaluation of left flank pain/left low back pain, left-sided groin pain.  Reports that his symptoms began 48 hours ago.  States that he was up playing video games when he changed positions and had a sudden onset of pain in his left lower back which radiates into his left groin.  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  States that this was managed with pinning.  Reports that this pain did relieve however returned today and has been persistent since.  Reports the pain is so significant he is having issues bearing weight on his left leg.  Denies any nausea, vomiting, fevers at home.  Denies diarrhea.  Denies dysuria.  Denies history of kidney stones.  Reports that when he attempts to move his left leg the pain worsens.  Denies distal weakness, groin numbness or bladder incontinence.    Groin Pain Associated symptoms include abdominal pain.  Flank Pain Associated symptoms include abdominal pain.       Prior to Admission medications   Medication Sig Start Date End Date Taking? Authorizing Provider  albuterol  (VENTOLIN  HFA) 108 (90 Base) MCG/ACT inhaler INHALE 1-2 PUFFS INTO THE LUNGS EVERY 4 (FOUR) HOURS AS NEEDED FOR SHORTNESS OF BREATH OR WHEEZING. 06/04/22   Rolan Ezra RAMAN, MD  amiodarone  (PACERONE ) 200 MG tablet Take 0.5 tablets (100 mg total) by mouth daily. Patient taking once daily 02/17/24   McLean, Dalton S, MD  atorvastatin  (LIPITOR) 40 MG tablet Take 1 tablet (40 mg total) by mouth daily. 06/16/23   Milford, Harlene HERO, FNP  carvedilol  (COREG ) 6.25 MG tablet Take 1 tablet (6.25 mg total) by mouth 2 (two) times daily.  06/16/23   Milford, Harlene HERO, FNP  dapagliflozin  propanediol (FARXIGA ) 10 MG TABS tablet Take 1 tablet (10 mg total) by mouth daily. 06/16/23   Milford, Harlene HERO, FNP  methimazole  (TAPAZOLE ) 5 MG tablet Take 1 tablet (5 mg total) by mouth daily. 03/25/24 06/23/24  Motwani, Komal, MD  pantoprazole  (PROTONIX ) 40 MG tablet TAKE 1 TABLET (40 MG TOTAL) BY MOUTH DAILY. SCHEDULE AN APPT FOR FURTHER REFILLS 03/07/24   Joshua Debby LITTIE, MD  potassium chloride  SA (KLOR-CON  M) 20 MEQ tablet Take 2 tablets (40 mEq total) by mouth daily. Patient taking differently: Take 20 mEq by mouth as needed. 06/16/23   Milford, Harlene HERO, FNP  PRESCRIPTION MEDICATION Inhale into the lungs See admin instructions. Bipap, pressure 16/12 with 2L of O2 - use whenever sleeping    [provider]  sildenafil  (VIAGRA ) 50 MG tablet Take 50 mg by mouth as needed for erectile dysfunction.    [provider]  spironolactone  (ALDACTONE ) 25 MG tablet Take 1 tablet (25 mg total) by mouth at bedtime. 06/16/23   Milford, Harlene HERO, FNP  tirzepatide  (MOUNJARO ) 2.5 MG/0.5ML Pen Inject 2.5 mg into the skin once a week. 01/20/24   Rolan Ezra RAMAN, MD  torsemide  (DEMADEX ) 20 MG tablet Take 2 tablets (40 mg total) by mouth every morning 02/22/24   McLean, Dalton S, MD  traZODone  (DESYREL ) 50 MG tablet Take 0.5-1 tablets (25-50 mg total) by mouth at bedtime as needed  for sleep. 02/12/24   Bahraini, Sarah A  XARELTO  20 MG TABS tablet TAKE 1 TABLET BY MOUTH DAILY WITH SUPPER. 11/15/23   Joshua Debby CROME, MD  pravastatin  (PRAVACHOL ) 40 MG tablet Take 40 mg by mouth daily.  02/20/12  [provider]    Allergies: Ace inhibitors, Isosorbide , Bidil [isosorb dinitrate-hydralazine ], Digoxin  and related, and Buspirone    Review of Systems  Gastrointestinal:  Positive for abdominal pain.  Genitourinary:  Positive for flank pain.  All other systems reviewed and are negative.   Updated Vital Signs BP 112/74   Pulse 74   Temp 98.3 F  (36.8 C) (Oral)   Resp 19   Ht 5' 9 (1.753 m)   Wt (!) 152.4 kg   SpO2 99%   BMI 49.62 kg/m   Physical Exam Vitals and nursing note reviewed.  Constitutional:      General: He is not in acute distress.    Appearance: He is well-developed.  HENT:     Head: Normocephalic and atraumatic.   Eyes:     Conjunctiva/sclera: Conjunctivae normal.    Cardiovascular:     Rate and Rhythm: Normal rate and regular rhythm.     Heart sounds: No murmur heard. Pulmonary:     Effort: Pulmonary effort is normal. No respiratory distress.     Breath sounds: Normal breath sounds.  Abdominal:     Palpations: Abdomen is soft.     Tenderness: There is no abdominal tenderness. There is left CVA tenderness.  Genitourinary:    Comments: Cremasteric reflex present.  No high riding testicle noted.  Musculoskeletal:        General: No swelling.     Cervical back: Neck supple.       Back:   Skin:    General: Skin is warm and dry.     Capillary Refill: Capillary refill takes less than 2 seconds.   Neurological:     Mental Status: He is alert.   Psychiatric:        Mood and Affect: Mood normal.     (all labs ordered are listed, but only abnormal results are displayed) Labs Reviewed  CBC  COMPREHENSIVE METABOLIC PANEL WITH GFR  URINALYSIS, ROUTINE W REFLEX MICROSCOPIC  LIPASE, BLOOD    EKG: None  Radiology: No results found.   Procedures   Medications Ordered in the ED  oxyCODONE -acetaminophen  (PERCOCET/ROXICET) 5-325 MG per tablet 1 tablet (1 tablet Oral Given 05/21/24 0602)    Medical Decision Making Amount and/or Complexity of Data Reviewed Labs: ordered. Radiology: ordered.  Risk Prescription drug management.   58 year old male presents for evaluation.  Please see HPI for further details.  On examination the patient is afebrile and nontachycardic.  His lung sounds are clear bilaterally, he is not hypoxic.  His abdomen is soft and compressible.  He has left-sided CVA  tenderness.  Neurological examination at baseline.  Reassuring vital signs.  Differential diagnosis for this patient is broad.  Includes nephrolithiasis versus pyelonephritis versus lumbar strain.  Will assess with CBC, CMP, lipase, urinalysis, CT abdomen pelvis, CT L-spine, ultrasound imaging of the testicle to rule out torsion.  Patient given oxycodone  for pain control.  At the end of my shift, patient work about complete.  Signed out to oncoming provider [employer].  Plan of management discussed.    Final diagnoses:  Acute left-sided low back pain with left-sided sciatica    ED Discharge Orders     None  Ruthell Lonni FALCON, PA-C 05/21/24 9365    Geroldine Berg, MD 05/24/24 2306

## 2024-05-23 ENCOUNTER — Telehealth: Payer: Self-pay | Admitting: Pharmacist

## 2024-05-23 NOTE — Progress Notes (Signed)
 Pharmacy Quality Measure Review  This patient is appearing on a report for being at risk of failing the adherence measure for diabetes medications this calendar year.   Medication: Farxiga   Last fill date: 05/10/24 for 90 day supply  Insurance report was not up to date. No action needed at this time.   Darrelyn Drum, PharmD, BCPS, CPP Clinical Pharmacist Practitioner  Primary Care at [facility] [phone]

## 2024-05-26 ENCOUNTER — Ambulatory Visit: Admitting: Internal Medicine

## 2024-05-26 ENCOUNTER — Encounter: Payer: Self-pay | Admitting: Internal Medicine

## 2024-05-26 VITALS — BP 118/90 | HR 73 | Temp 97.5°F | Ht 69.0 in | Wt 336.0 lb

## 2024-05-26 DIAGNOSIS — K047 Periapical abscess without sinus: Secondary | ICD-10-CM

## 2024-05-26 MED ORDER — AMOXICILLIN-POT CLAVULANATE 875-125 MG PO TABS: 1.0000 | ORAL_TABLET | Freq: Two times a day (BID) | ORAL | 0 refills | Status: AC

## 2024-05-26 NOTE — Progress Notes (Signed)
 Subjective:    Patient ID: [name], male    DOB: [birth year], 58 y.o.   MRN: [id number]      HPI Travis is here for  Chief Complaint  Patient presents with   Jaw Pain    Jaw pain, left ear pain and sore throat (left side); Fatigue    Last year he had a dental infection and was given an antibiotic.  He saw his dentist - swelling came back and had to be on abx again.  He needs two teeth pulled on the left side.  He has had increased fatigue recently.  The past couple of days he has had left jaw/left side of face pain/ ear pain.   He is not sure if it is related to the teeth or something else.  The jaw pain is not bad right now.  No fever or chills.      Medications and allergies reviewed with patient and updated if appropriate.  Current Outpatient Medications on File Prior to Visit  Medication Sig Dispense Refill   albuterol  (VENTOLIN  HFA) 108 (90 Base) MCG/ACT inhaler INHALE 1-2 PUFFS INTO THE LUNGS EVERY 4 (FOUR) HOURS AS NEEDED FOR SHORTNESS OF BREATH OR WHEEZING. 8.5 each 1   amiodarone  (PACERONE ) 200 MG tablet Take 0.5 tablets (100 mg total) by mouth daily. Patient taking once daily 15 tablet 6   atorvastatin  (LIPITOR) 40 MG tablet Take 1 tablet (40 mg total) by mouth daily. 90 tablet 3   carvedilol  (COREG ) 6.25 MG tablet Take 1 tablet (6.25 mg total) by mouth 2 (two) times daily. 60 tablet 11   dapagliflozin  propanediol (FARXIGA ) 10 MG TABS tablet Take 1 tablet (10 mg total) by mouth daily. 90 tablet 1   HYDROcodone -acetaminophen  (NORCO/VICODIN) 5-325 MG tablet Take 1 tablet by mouth every 4 (four) hours as needed. 8 tablet 0   methimazole  (TAPAZOLE ) 5 MG tablet Take 1 tablet (5 mg total) by mouth daily. 90 tablet 1   pantoprazole  (PROTONIX ) 40 MG tablet TAKE 1 TABLET (40 MG TOTAL) BY MOUTH DAILY. SCHEDULE AN APPT FOR FURTHER REFILLS 90 tablet 1   potassium chloride  SA (KLOR-CON  M) 20 MEQ tablet Take 2 tablets (40 mEq total) by mouth daily. (Patient taking  differently: Take 20 mEq by mouth as needed.) 180 tablet 3   PRESCRIPTION MEDICATION Inhale into the lungs See admin instructions. Bipap, pressure 16/12 with 2L of O2 - use whenever sleeping     sildenafil  (VIAGRA ) 50 MG tablet Take 50 mg by mouth as needed for erectile dysfunction.     spironolactone  (ALDACTONE ) 25 MG tablet Take 1 tablet (25 mg total) by mouth at bedtime. 90 tablet 3   tirzepatide  (MOUNJARO ) 2.5 MG/0.5ML Pen Inject 2.5 mg into the skin once a week. 2 mL 6   torsemide  (DEMADEX ) 20 MG tablet Take 2 tablets (40 mg total) by mouth every morning 60 tablet 6   traZODone  (DESYREL ) 50 MG tablet Take 0.5-1 tablets (25-50 mg total) by mouth at bedtime as needed for sleep. 15 tablet 0   XARELTO  20 MG TABS tablet TAKE 1 TABLET BY MOUTH DAILY WITH SUPPER. 90 tablet 0   [DISCONTINUED] pravastatin  (PRAVACHOL ) 40 MG tablet Take 40 mg by mouth daily.     No current facility-administered medications on file prior to visit.    Review of Systems  Constitutional:  Negative for fever.  HENT:  Positive for ear pain (left) and sore throat (left side). Negative for congestion and sinus pain.  Respiratory:  Positive for cough (intermittent) and shortness of breath.   Neurological:  Positive for headaches (in the morning).       Objective:   Vitals:   05/26/24 1038  BP: (!) 118/90  Pulse: 73  Temp: (!) 97.5 F (36.4 C)  SpO2: 98%   BP Readings from Last 3 Encounters:  05/26/24 (!) 118/90  05/21/24 111/82  03/25/24 102/80   Wt Readings from Last 3 Encounters:  05/26/24 (!) 336 lb (152.4 kg)  05/21/24 (!) 335 lb 15.7 oz (152.4 kg)  03/25/24 (!) 336 lb (152.4 kg)   Body mass index is 49.62 kg/m.    Physical Exam Constitutional:      General: He is not in acute distress.    Appearance: Normal appearance. He is not ill-appearing.  HENT:     Head: Normocephalic and atraumatic.     Right Ear: Ear canal and external ear normal. There is no impacted cerumen (TM not visualized due  to excessive cerumen).     Left Ear: Tympanic membrane, ear canal and external ear normal. There is no impacted cerumen.     Mouth/Throat:     Mouth: Mucous membranes are moist.     Pharynx: No posterior oropharyngeal erythema.     Comments: Poor dentition - two molars in upper left rear have excessive tartar, slight gum swelling - no discharge/ bleeding  Musculoskeletal:     Cervical back: No rigidity or tenderness (no left TMJ or jaw tenderness).  Lymphadenopathy:     Cervical: No cervical adenopathy.   Skin:    General: Skin is warm and dry.     Findings: No erythema or rash.   Neurological:     Mental Status: He is alert.            Assessment & Plan:    Dental abscess: Acute on chronic Symptoms c/w dental abscess which is chronic and had been treated several times with abx  Awaiting to have teeth removed by dentist Start Augmentin  875-125 mg bid x 10 days - hopefully this will hold him over until he has the teeth removed

## 2024-05-26 NOTE — Patient Instructions (Addendum)
   Medications changes include :   Augmentin twice daily x 10 days      Return if symptoms worsen or fail to improve.

## 2024-05-30 ENCOUNTER — Other Ambulatory Visit: Payer: Self-pay | Admitting: Cardiology

## 2024-05-30 ENCOUNTER — Other Ambulatory Visit: Payer: Self-pay | Admitting: Internal Medicine

## 2024-05-30 DIAGNOSIS — E118 Type 2 diabetes mellitus with unspecified complications: Secondary | ICD-10-CM

## 2024-05-30 DIAGNOSIS — Z86711 Personal history of pulmonary embolism: Secondary | ICD-10-CM

## 2024-05-30 DIAGNOSIS — I480 Paroxysmal atrial fibrillation: Secondary | ICD-10-CM

## 2024-05-31 ENCOUNTER — Other Ambulatory Visit (HOSPITAL_COMMUNITY): Payer: Self-pay

## 2024-05-31 ENCOUNTER — Telehealth: Payer: Self-pay | Admitting: Pharmacy Technician

## 2024-05-31 NOTE — Telephone Encounter (Signed)
 Pharmacy Patient Advocate Encounter   Received notification from RX Request Messages that prior authorization for MOUNJARO  2.5 MG/0.5 ML PEN is required/requested.   Insurance verification completed.   The patient is insured through [facility] .   Per test claim: PA required; PA submitted to above mentioned insurance via CoverMyMeds Key/confirmation #/EOC [id number] Status is pending

## 2024-06-01 DIAGNOSIS — H5010 Unspecified exotropia: Secondary | ICD-10-CM | POA: Diagnosis not present

## 2024-06-01 DIAGNOSIS — H25813 Combined forms of age-related cataract, bilateral: Secondary | ICD-10-CM | POA: Diagnosis not present

## 2024-06-01 DIAGNOSIS — E119 Type 2 diabetes mellitus without complications: Secondary | ICD-10-CM | POA: Diagnosis not present

## 2024-06-01 LAB — HM DIABETES EYE EXAM

## 2024-06-01 NOTE — Telephone Encounter (Signed)
 Pharmacy Patient Advocate Encounter  Received notification from [employer] [facility] that Prior Authorization for mounjaro  2.5mg  has been APPROVED from 12/02/23 to 11/30/24. Spoke to pharmacy to process.Copay is $0.00.

## 2024-06-13 NOTE — Progress Notes (Signed)
 Remote ICD transmission.

## 2024-06-13 NOTE — Addendum Note (Signed)
 Addended by: TAWNI DRILLING D on: 06/13/2024 03:50 PM   Modules accepted: Orders

## 2024-06-20 ENCOUNTER — Ambulatory Visit: Attending: Cardiology

## 2024-06-20 DIAGNOSIS — I5042 Chronic combined systolic (congestive) and diastolic (congestive) heart failure: Secondary | ICD-10-CM | POA: Diagnosis not present

## 2024-06-20 DIAGNOSIS — Z95810 Presence of automatic (implantable) cardiac defibrillator: Secondary | ICD-10-CM | POA: Diagnosis not present

## 2024-06-22 NOTE — Progress Notes (Signed)
 EPIC Encounter for ICM Monitoring  Patient Name: [name] is a 58 y.o. male Date: 06/22/2024 Primary Care Physican: Joshua Debby LITTIE, MD Primary Cardiologist: Rolan Electrophysiologist: Cindie Pore Pacing: 96% 02/04/2023 Weight:  358 lbs       06/01/2023 Weight: 328 lbs (losing weight taking Mounjaro )   06/09/2023 Weight: 328 lbs             07/07/2023 Weight: 308 lbs   9/10/202 Weight: 308 lbs      09/16/2023 Weight: 317 lbs    12/29/2023 Office Weight: 311 lbs 02/22/2024 Office Weight: 330 lbs 03/30/2024 Weight: 332 lbs (was up to 336 lbs) 04/01/2024 Weight:  331 lbs   Spoke with patient and heart failure questions reviewed.  Transmission results reviewed.  Pt reports SOB during decreased impedance but is now doing okay.     Diet:  He reports he stays thirsty and drinks a lot of fluids.     CorVue thoracic impedance suggesting normal fluid levels with the exception of possible fluid accumulation 7/9-7/18 .   Prescribed:  Torsemide  20 mg Take 2 tablet(s) (40 mg total) every morning. Potassium 20 mEq take 2 tablet(s) (40 mEq total) by mouth daily.   Spironolactone  25 mg take 1 tablet daily   Labs: 05/21/2024 Creatinine 1.33,BUN 19, Potassium 4.1, Sodium 135, GFR >60 02/22/2024 Creatinine 1.77, BUN 27, Potassium 3.7, Sodium 136, GFR 45 01/18/2024 Creatinine 1.56, BUN 24, Potassium 4.5, Sodium 135, GFR 52  12/29/2023 Creatinine 1.79, BUN 15, Potassium 3.0, Sodium 142, GFR 44 (Prescribed potassium) 12/16/2023 Creatinine 1.61, BUN 20, Potassium 3.7, Sodium 141 12/04/2023 Creatinine 1.51, BUN 23, Potassium 3.7, Sodium 136, GFR 54 A complete set of results can be found in Results Review.   Recommendations:      Follow-up plan: ICM clinic phone appointment 07/27/2024.   91 day device clinic remote transmission 07/26/2024.     EP/Cardiology Office Visits:  06/23/2024 with HF Clinic.  Recall 08/09/2023 with Charlies Arthur, PA   Copy of ICM check sent to Dr. Cindie.  3 month ICM trend:  06/20/2024.    12-14 Month ICM trend:     [name], RN 06/22/2024 4:49 PM

## 2024-06-23 ENCOUNTER — Encounter (HOSPITAL_COMMUNITY)

## 2024-06-23 NOTE — Progress Notes (Incomplete)
 Advanced Heart Failure Clinic Note   Primary Care: Dr. Debby Molt HF Cardiologist: Dr. Rolan   Chief complaint: Shortness of breath  HPI: [name] is a 58 y.o. male with a past medical history of NICM, EF 25-30% in March 2018, felt to be related to prior ETOH abuse. He also has a history of PE 03/2014 completed a years course of Xarelto , morbid obesity, OSA, and tobacco abuse.    He was admitted 11/12/15-11/14/15 with acute on chronic systolic CHF and palpitations. He wore a 30 day event monitor at discharge as he had frequent PVC's and questionable Afib on telemetry. Also with some NSVT, so he was started on Amiodarone , but at follow up had not started taking it. He had previously refused ICD and was seen inpatient by Dr. Kelsie who felt that his morbid obesity was a prohibitive factor.    He was seen in the clinic in April 2018. He had started drinking ETOH again. Volume status was stable, he is not an Entresto candidate due to angioedema with lisinopril. Weight was 411 pounds.    Admitted 04/12/17 with SOB, chest pain. D- dimer was 1.16, chest CT without central obstructing PE, however more peripheral and subsegmental pulmonary artery branches were not confidently evaluated due to his body habitus. He was started on a heparin  gtt for presumed PE, however VQ scan showed no PE. Troponin was elevated, peaked at 3.37. LHC showed no CAD. Echo showed an EF of 15%, grade 2 DD, no pericarditis. He was diuresed with IV lasix , and started on torsemide  20mg  at discharge. Discharge weight was 405 pounds.   Admitted 5/22 through 04/24/2018 with abdominal pain, nausea, and vomiting. 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 Did not require surgical intervention. Followed by outpatient GI.   Echo in 2/21 with EF 20-25%, severe LV dilation.   Presented 05/09/21 w/ SOB 2/2 acute CHF w ? PNA.  CTA showed multifocal ?PNA, no PE. Also in Afib w/ RVR in 150s on admit. Initially required bipap, developed hemoptysis and  intubated. Treated w/ IV Lasix . Echo 05/10/21: LVEF < 20%. RV moderately reduced. Improved and was extubated 6/13. Went into Afib w/ RVR. Refused to wear BiPAP. Developed severe agitation/hypoxia and re-intubated.  Went into VT>>VF arrest, treated w/ ACLS>>ROSC after . Developed refractory AFib again next morning requiring emergent cardioversion 05/16/21. He was transitioned to PO amiodarone . HR remained in 40-50's, metoprolol  and sildenafil  were stopped. Discharge weight 367 lbs.  St Jude CRT-D device placed in 2/23.  Echo 8/23 showed EF <20%, mild MR, RV normal.  CPX in 9/23 showed moderate functional limitation due to HF and body habitus.   Echo in 9/24 showed EF <20% with severe LV dilation, normal RV size/function, no significant MR.   CPX was done in 10/24, this showed only mild to moderate HF limitation.  However, RHC in 10/24 showed mildly elevated filling pressures and low cardiac index (1.69 Fick, 2.06 Thermo).    He is here today for CHF follow-up.   He is still smoking 1/2 ppd. Rare ETOH.   ECG (personally reviewed):     PMH: 1. Nonischemic cardiomyopathy: Prior cath with no significant CAD.  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  Prior echoes with EF as low as 25%.  Echo (9/13) with EF 35-40%, moderate to severe LV dilation, diffuse hypokinesis, mild MR. Echo (5/15) with EF 30-35%, moderate to severe LAE, normal RV size and systolic function.  Angioedema with ACEI, headaches with hydralazine /nitrates. Echo (  3/16) with EF 25-30%, severe LV dilation, normal RV size and systolic function.  [facility] 08/13/15 showed no significant coronary disease; RA mean 6, PA 33/11 mean 23, PCWP mean 13, Fick CO/CI 4.75 /1.68 (difficult study, radial artery spasm, if needs future cath would use groin).  Echo (9/16) showed EF 20-25%.   - Echo (3/18): EF 25-30%, moderate LAE - Echo 4/18 EF 15% - Echo 7/19 EF 20-25% - Echo 2/21 with EF 20-25%, severe LV dilation.   - Echo 6/22 with EF < 20%, normal RV. - St Jude CRT-D 2/23.  - Echo (8/23): EF < 20%, mild MR, RV normal.  - CPX (9/23): Peak VO2 13.1, VE/VCO2 slope 37, RER 1.10.  Moderate functional limitation, body habitus and HF.  - Echo (9/24): EF <20% with severe LV dilation, normal RV size/function, no significant MR.  - CPX (10/24): peak VO2 14.5, VE/VCO2 slope 33, RER 1.06.  Mild-moderate HF limitation.  - RHC (10/24): mean RA 9, PA 38/15 mean 29, mean PCWP 22, CI 1.69 Fick/2.06 thermo, PAPi 2.5.  2. HTN: angioedema with ACEI.  3. OSA: on Bipap 4. Morbid obesity 5. Paroxysmal atrial fibrillation: Urgent DCCV 6/22. 6. Smoker.  7. Anxiety/panic attacks 8. PE: 5/15, diagnosed by V/Q scan. CTA chest 8/16 negative for PE.  9. NSVT, PVCs: 30 day monitor (12/16) with PVCs, PACs, no atrial fibrillation.  - Zio patch (9/19): few short NSVT runs, no atrial fibrillation, 1.1% PVCs 10. Hematuria: Apparently had negative workup by urology.  11. ABIs (6/16) were normal 12. 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  13. Gout 14. Low back pain.  15. 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 CKD stage 3 17. Type 2 diabetes 18. 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    SH: Separated, lives in [place], drinks ETOH occasionally, no drugs. Prior smoker.  Has son and daughter.     FH: No premature CAD.    Review of systems complete and found to be negative unless listed in HPI.   Current Outpatient Medications  Medication Sig Dispense Refill   albuterol  (VENTOLIN  HFA) 108 (90 Base) MCG/ACT inhaler INHALE 1-2 PUFFS INTO THE LUNGS EVERY 4 (FOUR) HOURS AS NEEDED FOR SHORTNESS OF BREATH OR WHEEZING. 8.5 each 1   amiodarone  (PACERONE ) 200 MG tablet Take 0.5 tablets (100 mg total) by mouth daily. Patient taking once daily 15 tablet 6   atorvastatin  (LIPITOR) 40 MG tablet Take 1 tablet (40 mg total) by mouth daily. 90 tablet 3   carvedilol  (COREG ) 6.25 MG tablet Take 1 tablet (6.25 mg total) by mouth 2 (two) times daily. 60 tablet  11   dapagliflozin  propanediol (FARXIGA ) 10 MG TABS tablet Take 1 tablet (10 mg total) by mouth daily. 90 tablet 1   HYDROcodone -acetaminophen  (NORCO/VICODIN) 5-325 MG tablet Take 1 tablet by mouth every 4 (four) hours as needed. 8 tablet 0   methimazole  (TAPAZOLE ) 5 MG tablet Take 1 tablet (5 mg total) by mouth daily. 90 tablet 1   pantoprazole  (PROTONIX ) 40 MG tablet TAKE 1 TABLET (40 MG TOTAL) BY MOUTH DAILY. SCHEDULE AN APPT FOR FURTHER REFILLS 90 tablet 1   potassium chloride  SA (KLOR-CON  M) 20 MEQ tablet Take 2 tablets (40 mEq total) by mouth daily. (Patient taking differently: Take 20 mEq by mouth as needed.) 180 tablet 3   PRESCRIPTION MEDICATION Inhale into the lungs See admin instructions. Bipap, pressure 16/12 with 2L of O2 - use whenever sleeping     sildenafil  (VIAGRA ) 50 MG tablet Take 50 mg by mouth as needed for  erectile dysfunction.     spironolactone  (ALDACTONE ) 25 MG tablet Take 1 tablet (25 mg total) by mouth at bedtime. 90 tablet 3   tirzepatide  (MOUNJARO ) 2.5 MG/0.5ML Pen INJECT 2.5 MG SUBCUTANEOUSLY WEEKLY 2 mL 0   torsemide  (DEMADEX ) 20 MG tablet Take 2 tablets (40 mg total) by mouth every morning 60 tablet 6   traZODone  (DESYREL ) 50 MG tablet Take 0.5-1 tablets (25-50 mg total) by mouth at bedtime as needed for sleep. 15 tablet 0   XARELTO  20 MG TABS tablet TAKE 1 TABLET BY MOUTH DAILY WITH SUPPER. 90 tablet 0   No current facility-administered medications for this visit.   Allergies  Allergen Reactions   Ace Inhibitors Anaphylaxis and Swelling    Angioedema   Isosorbide  Anaphylaxis   Bidil [Isosorb Dinitrate-Hydralazine ] Other (See Comments)    headache   Digoxin  And Related     Unspecified side effects   Buspirone Other (See Comments)    dizziness   Social History   Socioeconomic History   Marital status: Divorced    Spouse name: Not on file   Number of children: 3   Years of education: Not on file   Highest education level: Not on file  Occupational  History   Occupation: DISABLED  Tobacco Use   Smoking status: Some Days    Current packs/day: 0.00    Average packs/day: 0.5 packs/day for 30.0 years (15.0 ttl pk-yrs)    Types: Cigarettes    Start date: 05/15/1990    Last attempt to quit: 05/15/2020    Years since quitting: 4.1   Smokeless tobacco: Never   Tobacco comments:    vaping - nicotine -free products  Vaping Use   Vaping status: Never Used  Substance and Sexual Activity   Alcohol use: Not Currently    Alcohol/week: 0.0 standard drinks of alcohol   Drug use: No   Sexual activity: Not Currently  Other Topics Concern   Not on file  Social History Narrative   He smokes about a pack per day and he has been smoking since he was 58 years of age.  He drinks alcohol occasionally, but he denies any illicit drug abuse.  He is presently on disability.    Lives with wife in a 2 story home.  Has 2 children.   Previously worked in Office manager, last worked in 1998.   Highest level of education:  11th grade      Lives with his daughter's mother, for right now.  Recently divorced. (2024).        Social Drivers of Health   Financial Resource Strain: Medium Risk (09/17/2023)   Overall Financial Resource Strain (CARDIA)    Difficulty of Paying Living Expenses: Somewhat hard  Food Insecurity: Food Insecurity Present (09/17/2023)   Hunger Vital Sign    Worried About Running Out of Food in the Last Year: Sometimes true    Ran Out of Food in the Last Year: Sometimes true  Transportation Needs: No Transportation Needs (09/17/2023)   PRAPARE - Administrator, Civil Service (Medical): No    Lack of Transportation (Non-Medical): No  Physical Activity: Inactive (09/17/2023)   Exercise Vital Sign    Days of Exercise per Week: 0 days    Minutes of Exercise per Session: 0 min  Stress: Stress Concern Present (09/17/2023)   [employer] of Occupational Health - Occupational Stress Questionnaire    Feeling of Stress : Rather much   Social Connections: Socially Isolated (09/17/2023)   Social Connection  and Isolation Panel    Frequency of Communication with Friends and Family: More than three times a week    Frequency of Social Gatherings with Friends and Family: Never    Attends Religious Services: Never    Database administrator or Organizations: No    Attends Banker Meetings: Never    Marital Status: Divorced  Catering manager Violence: Not At Risk (11/24/2023)   Received from [facility]   HITS    Over the last 12 months how often did your partner physically hurt you?: Never    Over the last 12 months how often did your partner insult you or talk down to you?: Never    Over the last 12 months how often did your partner threaten you with physical harm?: Never    Over the last 12 months how often did your partner scream or curse at you?: Never   Family History  Problem Relation Age of Onset   Cancer Mother        brain tumor   Hypertension Mother    Diabetes Father        Deceased, 15   Heart disease Maternal Grandmother    Hypertension Other        Family History   Stroke Other        Family History   Diabetes Other        Family History   Diabetes Daughter    There were no vitals taken for this visit.  Wt Readings from Last 3 Encounters:  05/26/24 (!) 152.4 kg (336 lb)  05/21/24 (!) 152.4 kg (335 lb 15.7 oz)  03/25/24 (!) 152.4 kg (336 lb)   PHYSICAL EXAM: General: NAD Neck: No JVD, no thyromegaly or thyroid  nodule.  Lungs: Clear to auscultation bilaterally with normal respiratory effort. CV: Nondisplaced PMI.  Heart regular S1/S2, no S3/S4, no murmur.  No peripheral edema.  No carotid bruit.  Normal pedal pulses.  Abdomen: Soft, nontender, no hepatosplenomegaly, no distention.  Skin: Intact without lesions or rashes.  Neurologic: Alert and oriented x 3.  Psych: Normal affect. Extremities: No clubbing or cyanosis.  HEENT: Normal.   ASSESSMENT & PLAN: 1. Atrial  fibrillation: Paroyxsmal. Emergent DCCV in 6/22. He is in NSR today. - Continue amiodarone  100 mg daily. Check LFTs. Will need regular eye exam.  Seeing endocrinology for hyperthyroidism, has been on methimazole . TSH high in January, normalized after cutting back methimazole .  - Continue Xarelto .  2.  Chronic systolic CHF: Nonischemic cardiomyopathy. ?If ETOH has played a role (now drinks rarely). Echo (3/18) with EF 25-30%. Echo 7/19 and in 2/21 with EF 20-25%. Echo 6/22 with EF < 20%, normal RV.  Has St Jude CRT-D device now.  Echo-post CRT (8/23) showed EF < 20%, mild MR, RV normal.  CPX in 9/23 with moderate functional limitation due to HF and body habitus. Echo in 9/24 showed  EF <20% with severe LV dilation, normal RV size/function, no significant MR.  CPX in 10/24 showed mild to moderate HF limitation but RHC in 10/24 showed low output (1.69 thermo/2.05 Fick).  Medication titration has been limited by hypotension.  Symptoms really do not match his 10/24 RHC and seem more like the 10/24 CPX which showed less severe limitation.  NYHA class *** - Continue torsemide  40 mg daily. BMET today.  He says that he is now taking his potassium regularly.  - Unable to tolerate losartan  due to low BP (off).   - Continue Coreg  6.25  mg bid. BP will not tolerate titration.   - Continue spironolactone  25 mg daily. - Continue Farxiga  10 mg daily. - No Entresto, ACEI with h/o angioedema.  - Headaches with Bidil, cannot take.  - He did not tolerate digoxin .   - Insurance denied baroreceptor activation therapy.  - In the past, too heavy to consider LVAD or transplant.  He has lost a lot of weight.  [facility] Devore at [facility], not transplant candidate as BMI is still too high and still smoking but thought he was reasonable for LVAD.  I worry that he is nearing end-stage HF with low output on RHC and difficulty titrating meds. Currently, he seems to be in a window where LVAD would be feasible.  He was seen by Dr. Lucas  and deemed and LVAD candidate.  With some worsening of renal function, we may not have a wide window.  However, his CPX showed only a mild to moderate HF limitation and his symptoms are really minimal currently (NYHA I-II). There is a definite disconnect between symptoms and RHC.  We have decided to hold off on LVAD for now but to follow him closely.  3. HTN: BP now runs low, limits GDMT.  4. 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  Has St Jude CRT-D device now. On amiodarone . No VT on device interrogation  - continue amiodarone  100 mg daily. See discussion above regarding thyroid  labs. 5. OSA: reports full compliance w/ BiPAP.  - Continue nightly. 6. PE: 04/14/2018, diagnosed by V/Q scan. Has been on Xarelto . No bleeding issues. 7. Anxiety/Panic attacks/depression: Followed by psychiatry.  Seems to be doing better.  8. Hyperlipidemia:   - Continue atorvastatin . 9. Obesity:  Weight is up, but he has restarted Mounjaro .  10. Tobacco use: Back smoking. Discussed cessation. 11. T2DM  - on Mounjaro  and Farxiga  12. Hyperthyroidism: Suspect related to amiodarone . - Followed by endocrinology and is on methimazole . TSH and Free T4 in April were normal.   Followup ***  I spent *** minutes reviewing data, interviewing patient and his family, and organizing the orders/followup.   Brigitta Pricer N  06/23/2024
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
## 2024-06-29 ENCOUNTER — Encounter: Payer: Self-pay | Admitting: Psychiatry

## 2024-06-29 ENCOUNTER — Telehealth (HOSPITAL_COMMUNITY): Payer: Self-pay

## 2024-06-29 ENCOUNTER — Telehealth: Admitting: Psychiatry

## 2024-06-29 ENCOUNTER — Telehealth: Payer: Self-pay | Admitting: Psychiatry

## 2024-06-29 DIAGNOSIS — G4700 Insomnia, unspecified: Secondary | ICD-10-CM | POA: Diagnosis not present

## 2024-06-29 DIAGNOSIS — F4310 Post-traumatic stress disorder, unspecified: Secondary | ICD-10-CM

## 2024-06-29 DIAGNOSIS — F410 Panic disorder [episodic paroxysmal anxiety] without agoraphobia: Secondary | ICD-10-CM

## 2024-06-29 DIAGNOSIS — F3175 Bipolar disorder, in partial remission, most recent episode depressed: Secondary | ICD-10-CM

## 2024-06-29 MED ORDER — DOXEPIN HCL 6 MG PO TABS: 6.0000 mg | ORAL_TABLET | Freq: Every evening | ORAL | 1 refills | Status: DC | PRN

## 2024-06-29 NOTE — Telephone Encounter (Signed)
 Please notify him that I was able to speak with his cardiologist, Dr. Rolan. I have sent in a prescription for Doxepin  6 mg to be taken at night as needed for insomnia. Please advise him to try this medication for insomnia.

## 2024-06-29 NOTE — Addendum Note (Signed)
 Addended by: Rachel Samples on: 06/29/2024 05:36 PM   Modules accepted: Orders

## 2024-06-29 NOTE — Telephone Encounter (Signed)
 Called to confirm/remind patient of their appointment at the [facility] on 06/30/2024 11:00.   Appointment:   [x] Confirmed  [] Left mess   [] No answer/No voice mail  [] VM Full/unable to leave message  [] Phone not in service  Patient reminded to bring all medications and/or complete list.  Confirmed patient has transportation. Gave directions, instructed to utilize valet parking.

## 2024-06-29 NOTE — Patient Instructions (Signed)
  Next appointment- 10/1 at 1 20 for 20 mins

## 2024-06-30 ENCOUNTER — Ambulatory Visit (HOSPITAL_COMMUNITY)
Admission: RE | Admit: 2024-06-30 | Discharge: 2024-06-30 | Disposition: A | Discharge status: Home / Self Care | Source: Ambulatory Visit | Attending: Physician Assistant | Admitting: Physician Assistant

## 2024-06-30 ENCOUNTER — Encounter (HOSPITAL_COMMUNITY): Payer: Self-pay

## 2024-06-30 ENCOUNTER — Ambulatory Visit (HOSPITAL_COMMUNITY): Payer: Self-pay | Admitting: Physician Assistant

## 2024-06-30 VITALS — BP 103/75 | HR 72 | Ht 69.0 in | Wt 346.2 lb

## 2024-06-30 DIAGNOSIS — I472 Ventricular tachycardia, unspecified: Secondary | ICD-10-CM | POA: Diagnosis not present

## 2024-06-30 DIAGNOSIS — Z95810 Presence of automatic (implantable) cardiac defibrillator: Secondary | ICD-10-CM | POA: Insufficient documentation

## 2024-06-30 DIAGNOSIS — E0590 Thyrotoxicosis, unspecified without thyrotoxic crisis or storm: Secondary | ICD-10-CM | POA: Insufficient documentation

## 2024-06-30 DIAGNOSIS — E1122 Type 2 diabetes mellitus with diabetic chronic kidney disease: Secondary | ICD-10-CM | POA: Diagnosis not present

## 2024-06-30 DIAGNOSIS — I110 Hypertensive heart disease with heart failure: Secondary | ICD-10-CM | POA: Insufficient documentation

## 2024-06-30 DIAGNOSIS — I480 Paroxysmal atrial fibrillation: Secondary | ICD-10-CM

## 2024-06-30 DIAGNOSIS — Z720 Tobacco use: Secondary | ICD-10-CM

## 2024-06-30 DIAGNOSIS — Z7984 Long term (current) use of oral hypoglycemic drugs: Secondary | ICD-10-CM | POA: Insufficient documentation

## 2024-06-30 DIAGNOSIS — T462X5A Adverse effect of other antidysrhythmic drugs, initial encounter: Secondary | ICD-10-CM

## 2024-06-30 DIAGNOSIS — I428 Other cardiomyopathies: Secondary | ICD-10-CM | POA: Diagnosis not present

## 2024-06-30 DIAGNOSIS — F17210 Nicotine dependence, cigarettes, uncomplicated: Secondary | ICD-10-CM | POA: Insufficient documentation

## 2024-06-30 DIAGNOSIS — Z86711 Personal history of pulmonary embolism: Secondary | ICD-10-CM | POA: Insufficient documentation

## 2024-06-30 DIAGNOSIS — Z79899 Other long term (current) drug therapy: Secondary | ICD-10-CM | POA: Insufficient documentation

## 2024-06-30 DIAGNOSIS — I5022 Chronic systolic (congestive) heart failure: Secondary | ICD-10-CM | POA: Insufficient documentation

## 2024-06-30 DIAGNOSIS — G4733 Obstructive sleep apnea (adult) (pediatric): Secondary | ICD-10-CM | POA: Diagnosis not present

## 2024-06-30 DIAGNOSIS — E0580 Other thyrotoxicosis without thyrotoxic crisis or storm: Secondary | ICD-10-CM

## 2024-06-30 DIAGNOSIS — Z7901 Long term (current) use of anticoagulants: Secondary | ICD-10-CM | POA: Insufficient documentation

## 2024-06-30 DIAGNOSIS — E785 Hyperlipidemia, unspecified: Secondary | ICD-10-CM | POA: Diagnosis not present

## 2024-06-30 DIAGNOSIS — Z5986 Financial insecurity: Secondary | ICD-10-CM | POA: Diagnosis not present

## 2024-06-30 DIAGNOSIS — Z7985 Long-term (current) use of injectable non-insulin antidiabetic drugs: Secondary | ICD-10-CM | POA: Insufficient documentation

## 2024-06-30 DIAGNOSIS — I4901 Ventricular fibrillation: Secondary | ICD-10-CM | POA: Insufficient documentation

## 2024-06-30 DIAGNOSIS — I10 Essential (primary) hypertension: Secondary | ICD-10-CM | POA: Diagnosis not present

## 2024-06-30 DIAGNOSIS — I959 Hypotension, unspecified: Secondary | ICD-10-CM | POA: Insufficient documentation

## 2024-06-30 DIAGNOSIS — N183 Chronic kidney disease, stage 3 unspecified: Secondary | ICD-10-CM | POA: Insufficient documentation

## 2024-06-30 LAB — COMPREHENSIVE METABOLIC PANEL WITH GFR
ALT: 15 U/L (ref 0–44)
AST: 17 U/L (ref 15–41)
Albumin: 3.3 g/dL — ABNORMAL LOW (ref 3.5–5.0)
Alkaline Phosphatase: 117 U/L (ref 38–126)
Anion gap: 9 (ref 5–15)
BUN: 18 mg/dL (ref 6–20)
CO2: 24 mmol/L (ref 22–32)
Calcium: 8.9 mg/dL (ref 8.9–10.3)
Chloride: 105 mmol/L (ref 98–111)
Creatinine, Ser: 1.45 mg/dL — ABNORMAL HIGH (ref 0.61–1.24)
GFR, Estimated: 57 mL/min — ABNORMAL LOW (ref >60)
Glucose, Bld: 98 mg/dL (ref 70–99)
Potassium: 4.4 mmol/L (ref 3.5–5.1)
Sodium: 138 mmol/L (ref 135–145)
Total Bilirubin: 1 mg/dL (ref 0.0–1.2)
Total Protein: 6.8 g/dL (ref 6.5–8.1)

## 2024-06-30 LAB — TSH: TSH: 6.705 u[IU]/mL — ABNORMAL HIGH (ref 0.350–4.500)

## 2024-06-30 LAB — T4, FREE: Free T4: 1.1 ng/dL (ref 0.61–1.12)

## 2024-06-30 LAB — BRAIN NATRIURETIC PEPTIDE: B Natriuretic Peptide: 193.1 pg/mL — ABNORMAL HIGH (ref 0.0–100.0)

## 2024-06-30 NOTE — Telephone Encounter (Addendum)
 Pt aware, agreeable, and verbalized understanding   ----- Message from [facility], [facility] sent at 06/30/2024  2:12 PM EDT ----- His TSH is mildly elevated but free T4 is okay. This will need to be followed. He needs to get in for follow-up appointment with his endocrinologist. ----- Message ----- From: Interface, Lab In [place] Sent: 06/30/2024  12:58 PM EDT To: Manuelita Nat Dutch, PA-C

## 2024-06-30 NOTE — Patient Instructions (Addendum)
 There has been no changes to your medications.  Labs done today, your results will be available in MyChart, we will contact you for abnormal readings.  PLEASE CALL YOUR ENDOCRINOLOGIST FOR A FOLLOW UP APPOINTMENT.  Your physician recommends that you schedule a follow-up appointment in: 3 months ( October) ** PLEASE CALL THE OFFICE IN 3 WEEKS TO ARRANGE YOUR FOLLOW UP APPOINTMENT.**  If you have any questions or concerns before your next appointment please send us  a message through [place] or call our office at [phone].    TO LEAVE A MESSAGE FOR THE NURSE SELECT OPTION 2, PLEASE LEAVE A MESSAGE INCLUDING: YOUR NAME DATE OF BIRTH CALL BACK NUMBER REASON FOR CALL**this is important as we prioritize the call backs  YOU WILL RECEIVE A CALL BACK THE SAME DAY AS LONG AS YOU CALL BEFORE 4:00 PM  At the [facility], you and your health needs are our priority. As part of our continuing mission to provide you with exceptional heart care, we have created designated Provider Care Teams. These Care Teams include your primary Cardiologist (physician) and Advanced Practice Providers (APPs- Physician Assistants and Nurse Practitioners) who all work together to provide you with the care you need, when you need it.   You may see any of the following providers on your designated Care Team at your next follow up: Dr Toribio Fuel Dr Ezra Shuck Dr. Ria Commander Dr. Morene Brownie Amy Lenetta, NP Caffie Shed, [place] [facility] [place], [place] Beckey Coe, NP [place] Lee, NP Ellouise Class, NP Tinnie Redman, PharmD Jaun Bash, PharmD   Please be sure to bring in all your medications bottles to every appointment.    Thank you for choosing [facility]

## 2024-06-30 NOTE — Progress Notes (Addendum)
 Advanced Heart Failure Clinic Note   Primary Care: Dr. Debby Molt HF Cardiologist: Dr. Rolan   Chief complaint: CHF  HPI: Mr. Anding is a 58 y.o. male with a past medical history of NICM, EF 25-30% in March 2018, felt to be related to prior ETOH abuse. He also has a history of PE 03/2014 completed a years course of Xarelto , morbid obesity, OSA, and tobacco abuse.    He was admitted 11/12/15-11/14/15 with acute on chronic systolic CHF and palpitations. He wore a 30 day event monitor at discharge as he had frequent PVC's and questionable Afib on telemetry. Also with some NSVT, so he was started on Amiodarone , but at follow up had not started taking it. He had previously refused ICD and was seen inpatient by Dr. Kelsie who felt that his morbid obesity was a prohibitive factor.    He was seen in the clinic in April 2018. He had started drinking ETOH again. Volume status was stable, he is not an Entresto candidate due to angioedema with lisinopril. Weight was 411 pounds.    Admitted 04/12/17 with SOB, chest pain. D- dimer was 1.16, chest CT without central obstructing PE, however more peripheral and subsegmental pulmonary artery branches were not confidently evaluated due to his body habitus. He was started on a heparin  gtt for presumed PE, however VQ scan showed no PE. Troponin was elevated, peaked at 3.37. LHC showed no CAD. Echo showed an EF of 15%, grade 2 DD, no pericarditis. He was diuresed with IV lasix , and started on torsemide  20mg  at discharge. Discharge weight was 405 pounds.   Admitted 5/22 through 04/24/2018 with abdominal pain, nausea, and vomiting. 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 Did not require surgical intervention. Followed by outpatient GI.   Echo in 2/21 with EF 20-25%, severe LV dilation.   Presented 05/09/21 w/ SOB 2/2 acute CHF w ? PNA.  CTA showed multifocal ?PNA, no PE. Also in Afib w/ RVR in 150s on admit. Initially required bipap, developed hemoptysis and intubated. Treated  w/ IV Lasix . Echo 05/10/21: LVEF < 20%. RV moderately reduced. Improved and was extubated 6/13. Went into Afib w/ RVR. Refused to wear BiPAP. Developed severe agitation/hypoxia and re-intubated.  Went into VT>>VF arrest, treated w/ ACLS>>ROSC after . Developed refractory AFib again next morning requiring emergent cardioversion 05/16/21. He was transitioned to PO amiodarone . HR remained in 40-50's, metoprolol  and sildenafil  were stopped. Discharge weight 367 lbs.  St Jude CRT-D device placed in 2/23.  Echo 8/23 showed EF <20%, mild MR, RV normal.  CPX in 9/23 showed moderate functional limitation due to HF and body habitus.   Echo in 9/24 showed EF <20% with severe LV dilation, normal RV size/function, no significant MR.   CPX was done in 10/24, this showed only mild to moderate HF limitation.  However, RHC in 10/24 showed mildly elevated filling pressures and low cardiac index (1.69 Fick, 2.06 Thermo).    He is here today for CHF follow-up. Doing well from HF standpoint. His weight is up about 10 lb. He started mounjaro  back this week. He lost a supply a couple of months ago after his refrigerator broke. Patient is able to perform ADLs and climb a flight of stairs without difficulty. More short of breath in heat and tends to stay inside during warm months. No orthopnea, PND or lower extremity edema. Taking all medications as prescribed.  No ETOH or drug use. Still smoking but has cut back to about 5 cigarettes a day.  PMH: 1. Nonischemic cardiomyopathy: Prior cath with no significant CAD.  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  Prior echoes with EF as low as 25%.  Echo (9/13) with EF 35-40%, moderate to severe LV dilation, diffuse hypokinesis, mild MR. Echo (5/15) with EF 30-35%, moderate to severe LAE, normal RV size and systolic function.  Angioedema with ACEI, headaches with hydralazine /nitrates. Echo (3/16) with EF 25-30%, severe LV dilation, normal  RV size and systolic function.  [facility] 08/13/15 showed no significant coronary disease; RA mean 6, PA 33/11 mean 23, PCWP mean 13, Fick CO/CI 4.75 /1.68 (difficult study, radial artery spasm, if needs future cath would use groin).  Echo (9/16) showed EF 20-25%.   - Echo (3/18): EF 25-30%, moderate LAE - Echo 4/18 EF 15% - Echo 7/19 EF 20-25% - Echo 2/21 with EF 20-25%, severe LV dilation.  - Echo 6/22 with EF < 20%, normal RV. - St Jude CRT-D 2/23.  - Echo (8/23): EF < 20%, mild MR, RV normal.  - CPX (9/23): Peak VO2 13.1, VE/VCO2 slope 37, RER 1.10.  Moderate functional limitation, body habitus and HF.  - Echo (9/24): EF <20% with severe LV dilation, normal RV size/function, no significant MR.  - CPX (10/24): peak VO2 14.5, VE/VCO2 slope 33, RER 1.06.  Mild-moderate HF limitation.  - RHC (10/24): mean RA 9, PA 38/15 mean 29, mean PCWP 22, CI 1.69 Fick/2.06 thermo, PAPi 2.5.  2. HTN: angioedema with ACEI.  3. OSA: on Bipap 4. Morbid obesity 5. Paroxysmal atrial fibrillation: Urgent DCCV 6/22. 6. Smoker.  7. Anxiety/panic attacks 8. PE: 5/15, diagnosed by V/Q scan. CTA chest 8/16 negative for PE.  9. NSVT, PVCs: 30 day monitor (12/16) with PVCs, PACs, no atrial fibrillation.  - Zio patch (9/19): few short NSVT runs, no atrial fibrillation, 1.1% PVCs 10. Hematuria: Apparently had negative workup by urology.  11. ABIs (6/16) were normal 12. 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  13. Gout 14. Low back pain.  15. 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 CKD stage 3 17. Type 2 diabetes 18. 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    Review of systems complete and found to be negative unless listed in HPI.   Current Outpatient Medications  Medication Sig Dispense Refill   albuterol  (VENTOLIN  HFA) 108 (90 Base) MCG/ACT inhaler INHALE 1-2 PUFFS INTO THE LUNGS EVERY 4 (FOUR) HOURS AS NEEDED FOR SHORTNESS OF BREATH OR WHEEZING. 8.5 each 1   amiodarone  (PACERONE ) 200 MG tablet Take 0.5 tablets (100 mg total) by  mouth daily. Patient taking once daily 15 tablet 6   atorvastatin  (LIPITOR) 40 MG tablet Take 1 tablet (40 mg total) by mouth daily. 90 tablet 3   carvedilol  (COREG ) 6.25 MG tablet Take 1 tablet (6.25 mg total) by mouth 2 (two) times daily. 60 tablet 11   dapagliflozin  propanediol (FARXIGA ) 10 MG TABS tablet Take 1 tablet (10 mg total) by mouth daily. 90 tablet 1   Doxepin  HCl 6 MG TABS Take 1 tablet (6 mg total) by mouth at bedtime as needed. 30 tablet 1   HYDROcodone -acetaminophen  (NORCO/VICODIN) 5-325 MG tablet Take 1 tablet by mouth every 4 (four) hours as needed. 8 tablet 0   methimazole  (TAPAZOLE ) 5 MG tablet Take 1 tablet (5 mg total) by mouth daily. 90 tablet 1   pantoprazole  (PROTONIX ) 40 MG tablet TAKE 1 TABLET (40 MG TOTAL) BY MOUTH DAILY. SCHEDULE AN APPT FOR FURTHER REFILLS 90 tablet 1   potassium chloride  SA (KLOR-CON   M) 20 MEQ tablet Take 2 tablets (40 mEq total) by mouth daily. 180 tablet 3   PRESCRIPTION MEDICATION Inhale into the lungs See admin instructions. Bipap, pressure 16/12 with 2L of O2 - use whenever sleeping     sildenafil  (VIAGRA ) 50 MG tablet Take 50 mg by mouth as needed for erectile dysfunction.     spironolactone  (ALDACTONE ) 25 MG tablet Take 1 tablet (25 mg total) by mouth at bedtime. 90 tablet 3   tirzepatide  (MOUNJARO ) 2.5 MG/0.5ML Pen INJECT 2.5 MG SUBCUTANEOUSLY WEEKLY 2 mL 0   torsemide  (DEMADEX ) 20 MG tablet Take 2 tablets (40 mg total) by mouth every morning 60 tablet 6   XARELTO  20 MG TABS tablet TAKE 1 TABLET BY MOUTH DAILY WITH SUPPER. 90 tablet 0   No current facility-administered medications for this encounter.   Allergies  Allergen Reactions   Ace Inhibitors Anaphylaxis and Swelling    Angioedema   Isosorbide  Anaphylaxis   Bidil [Isosorb Dinitrate-Hydralazine ] Other (See Comments)    headache   Digoxin  And Related     Unspecified side effects   Buspirone Other (See Comments)    dizziness   Social History   Socioeconomic History    Marital status: Divorced    Spouse name: Not on file   Number of children: 3   Years of education: Not on file   Highest education level: Not on file  Occupational History   Occupation: DISABLED  Tobacco Use   Smoking status: Some Days    Current packs/day: 0.00    Average packs/day: 0.5 packs/day for 30.0 years (15.0 ttl pk-yrs)    Types: Cigarettes    Start date: 05/15/1990    Last attempt to quit: 05/15/2020    Years since quitting: 4.1   Smokeless tobacco: Never   Tobacco comments:    vaping - nicotine -free products  Vaping Use   Vaping status: Never Used  Substance and Sexual Activity   Alcohol use: Not Currently    Alcohol/week: 0.0 standard drinks of alcohol   Drug use: No   Sexual activity: Not Currently  Other Topics Concern   Not on file  Social History Narrative   He smokes about a pack per day and he has been smoking since he was 58 years of age.  He drinks alcohol occasionally, but he denies any illicit drug abuse.  He is presently on disability.    Lives with wife in a 2 story home.  Has 2 children.   Previously worked in Office manager, last worked in 1998.   Highest level of education:  11th grade      Lives with his daughter's mother, for right now.  Recently divorced. (2024).        Social Drivers of Health   Financial Resource Strain: Medium Risk (09/17/2023)   Overall Financial Resource Strain (CARDIA)    Difficulty of Paying Living Expenses: Somewhat hard  Food Insecurity: Food Insecurity Present (09/17/2023)   Hunger Vital Sign    Worried About Running Out of Food in the Last Year: Sometimes true    Ran Out of Food in the Last Year: Sometimes true  Transportation Needs: No Transportation Needs (09/17/2023)   PRAPARE - Administrator, Civil Service (Medical): No    Lack of Transportation (Non-Medical): No  Physical Activity: Inactive (09/17/2023)   Exercise Vital Sign    Days of Exercise per Week: 0 days    Minutes of Exercise per Session: 0  min  Stress: Stress Concern Present (  09/17/2023)   [employer] of Occupational Health - Occupational Stress Questionnaire    Feeling of Stress : Rather much  Social Connections: Socially Isolated (09/17/2023)   Social Connection and Isolation Panel    Frequency of Communication with Friends and Family: More than three times a week    Frequency of Social Gatherings with Friends and Family: Never    Attends Religious Services: Never    Database administrator or Organizations: No    Attends Banker Meetings: Never    Marital Status: Divorced  Catering manager Violence: Not At Risk (11/24/2023)   Received from [facility]   HITS    Over the last 12 months how often did your partner physically hurt you?: Never    Over the last 12 months how often did your partner insult you or talk down to you?: Never    Over the last 12 months how often did your partner threaten you with physical harm?: Never    Over the last 12 months how often did your partner scream or curse at you?: Never   Family History  Problem Relation Age of Onset   Cancer Mother        brain tumor   Hypertension Mother    Diabetes Father        Deceased, 47   Heart disease Maternal Grandmother    Hypertension Other        Family History   Stroke Other        Family History   Diabetes Other        Family History   Diabetes Daughter    BP 103/75   Pulse 72   Ht 5' 9 (1.753 m)   Wt (!) 157 kg (346 lb 3.2 oz)   SpO2 95%   BMI 51.12 kg/m   Wt Readings from Last 3 Encounters:  06/30/24 (!) 157 kg (346 lb 3.2 oz)  05/26/24 (!) 152.4 kg (336 lb)  05/21/24 (!) 152.4 kg (335 lb 15.7 oz)   PHYSICAL EXAM: General:  Well appearing.  Cor: JVP difficult d/t thick neck. Regular rate & rhythm. No murmurs. Lungs: clear Abdomen: obese, soft, nontender, nondistended.  Extremities: no edema Neuro: alert & orientedx3. Affect pleasant  ICD interrogation: 96% BiV pacing, no VT/VF, < 1% AF burden, CorVue  thoracic impedance above threshold  ASSESSMENT & PLAN: 1.  Chronic systolic CHF: Nonischemic cardiomyopathy. ?If ETOH has played a role (now drinks rarely). Echo (3/18) with EF 25-30%. Echo 7/19 and in 2/21 with EF 20-25%. Echo 6/22 with EF < 20%, normal RV.  Has St Jude CRT-D device now.  Echo-post CRT (8/23) showed EF < 20%, mild MR, RV normal.  CPX in 9/23 with moderate functional limitation due to HF and body habitus. Echo in 9/24 showed  EF <20% with severe LV dilation, normal RV size/function, no significant MR.  CPX in 10/24 showed mild to moderate HF limitation but RHC in 10/24 showed low output (1.69 thermo/2.05 Fick).  Medication titration has been limited by hypotension.  Symptoms really do not match his 10/24 RHC and seem more like the 10/24 CPX which showed less severe limitation.  NYHA class II. - Continue Torsemide  40 mg daily. Check CMET/BNP today. - Unable to tolerate losartan  due to low BP (off).   - Continue Coreg  6.25 mg bid. No room for titration. - Continue spiro 25 mg daily - Continue farxiga  10 mg daily - No Entresto, ACEI with h/o angioedema.  - Headaches with  Bidil, cannot take.  - He did not tolerate digoxin .   - Insurance denied baroreceptor activation therapy.  - In the past, too heavy to consider LVAD or transplant.  He has lost a lot of weight.  He saw Dr Devore at [facility], not transplant candidate as BMI is still too high and still smoking but thought he was reasonable for LVAD.  I worry that he is nearing end-stage HF with low output on RHC and difficulty titrating meds. Currently, he seems to be in a window where LVAD would be feasible.  He was seen by Dr. Lucas and deemed an LVAD candidate.  With some worsening of renal function, we may not have a wide window.  However, his CPX showed only a mild to moderate HF limitation and his symptoms are really minimal currently (NYHA I-II). There is a definite disconnect between symptoms and RHC.  We have decided to hold off on  LVAD for now but will follow him closely.  2. Atrial fibrillation: Paroyxsmal. Emergent DCCV in 6/22. Rhythm regular on exam. < 1% burden on device check - Continue amiodarone  100 mg daily. Check LFTs, TSH, and Free T4. Will need regular eye exam.  Seeing endocrinology for hyperthyroidism, has been on methimazole . TSH high in January, normalized after cutting back methimazole . Due for f/u with Endocrine, recommended he call for appointment. - Continue Xarelto .  3. HTN: BP now low, no room to titrate GDMT as above. 4. 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  Has St Jude CRT-D device now. On amiodarone . No VT on device interrogation today. - Continue amiodarone  100 mg daily. See discussion above regarding thyroid  labs. 5. OSA: reports full compliance w/ BiPAP.  - Continue nightly. 6. PE: 04/14/2018, diagnosed by V/Q scan. Has been on Xarelto . No bleeding issues. 7. Anxiety/Panic attacks/depression: Followed by psychiatry.  Seems to be doing better.  8. Hyperlipidemia:   - Continue atorvastatin . 9. Obesity:  Weight up 10 lb, but still down about 100 lb overall.  - Recently restarted Mounjaro . 10. Tobacco use: Smoking about 5 cigarettes daily. Quit in the past. - Discussed cessation today. 11. T2DM  - on Mounjaro  and Farxiga  12. Hyperthyroidism: Suspect related to amiodarone . - Followed by endocrinology and is on methimazole . TSH and Free T4 today. See discussion above.  Followup 3 months with Dr. Rolan  I spent 33 minutes reviewing data, interviewing patient and his family, and organizing the orders/followup.   Ellaina Schuler N  06/30/2024

## 2024-06-30 NOTE — Telephone Encounter (Signed)
 Pt was notified.

## 2024-07-04 ENCOUNTER — Encounter: Payer: Self-pay | Admitting: "Endocrinology

## 2024-07-04 ENCOUNTER — Ambulatory Visit (INDEPENDENT_AMBULATORY_CARE_PROVIDER_SITE_OTHER): Admitting: "Endocrinology

## 2024-07-04 VITALS — BP 126/80 | HR 73 | Ht 69.0 in | Wt 344.0 lb

## 2024-07-04 DIAGNOSIS — E0590 Thyrotoxicosis, unspecified without thyrotoxic crisis or storm: Secondary | ICD-10-CM | POA: Diagnosis not present

## 2024-07-04 DIAGNOSIS — Z79899 Other long term (current) drug therapy: Secondary | ICD-10-CM

## 2024-07-04 MED ORDER — METHIMAZOLE 5 MG PO TABS: 2.5000 mg | ORAL_TABLET | Freq: Every day | ORAL | 1 refills | Status: DC

## 2024-07-04 NOTE — Progress Notes (Signed)
 Outpatient Endocrinology Note Obadiah Birmingham, MD  07/04/24   [name] [birth year] [id number]  Referring Provider: Joshua Debby LITTIE, MD Primary Care Provider: Joshua Debby LITTIE, MD Subjective  No chief complaint on file.   Assessment & Plan  Diagnoses and all orders for this visit:  Hyperthyroidism -     TSH -     T4, free -     T3, free  On amiodarone  therapy  Other orders -     methimazole  (TAPAZOLE ) 5 MG tablet; Take 0.5 tablets (2.5 mg total) by mouth daily.      [name] is currently taking methimazole  5 mg qd.  TSI/TRAb -ve He is on amiodarone  100 mg every day for Atrial fibrillation, on amiodarone  since 2022. 12/22/23: TSI/TRAb -ve 04/07/24: THYROID  SCAN AND UPTAKE : Decreased iodine  at take values at 4 hours and 24 hours. This may be artifactual given patient's chronic amiodarone  therapy Educated on thyroid  axis.  Recommend the following: Decrease to methimazole  2.5 mg once a day. Repeat labs now and then in 3 months or sooner if symptoms of hyper or hypothyroidism develop.  -complications of untreated hyperthyroidism include atrial fibrillation, heart failure and osteoporosis -side effects of Methimazole  include but are not limited to allergic reaction, rash, bone marrow suppression, liver dysfunction and teratogenic potential -implications in pregnancy and breastfeeding -compliance and follow up needs    Follows up with ophthalmologist  I have reviewed current medications, nurse's notes, allergies, vital signs, past medical and surgical history, family medical history, and social history for this encounter. Counseled patient on symptoms, examination findings, lab findings, imaging results, treatment decisions and monitoring and prognosis. The patient understood the recommendations and agrees with the treatment plan. All questions regarding treatment plan were fully answered.   Return in about 3 months (around 10/04/2024) for visit + labs before  next visit.   Obadiah Birmingham, MD  07/04/24   I have reviewed current medications, nurse's notes, allergies, vital signs, past medical and surgical history, family medical history, and social history for this encounter. Counseled patient on symptoms, examination findings, lab findings, imaging results, treatment decisions and monitoring and prognosis. The patient understood the recommendations and agrees with the treatment plan. All questions regarding treatment plan were fully answered.   History of Present Illness [name] is a 58 y.o. year old male who presents to our clinic with hyperthyroidism diagnosed in 07/2023.    On amiodarone   Symptoms suggestive of HYPOTHYROIDISM:  fatigue Yes weight gain No cold intolerance  No constipation  Yes, sometimes   Symptoms suggestive of HYPERTHYROIDISM:  weight loss  Yes, just restarted mounjaro  and lost 2 lbs, gained before mounjaro  heat intolerance No hyperdefecation  No palpitations  No has pace maker   Compressive symptoms:  dysphagia  No dysphonia  No positional dyspnea (especially with simultaneous arms elevation)  No  Smokes  Yes On biotin  No Personal history of head/neck surgery/irradiation  No  Adverse Drug Effects from Methimazole  (MMI): rash No fever No throat pain No arthritis No mouth ulcers No jaundice No loss of appetite No lymphadenopathy No  Grave's Ophthalmopathy Clinical Activity Score: 0/9  Component     Latest Ref Rng 07/30/2023 08/28/2023  TSH     0.350 - 4.500 uIU/mL <0.010 (L)    Triiodothyronine,Free,Serum     2.0 - 4.4 pg/mL  3.0   T4,Free(Direct)     0.61 - 1.12 ng/dL  8.45 (H)    88/07/7974 CT CHEST, ABDOMEN  AND PELVIS WITHOUT CONTRAST  Thyroid  gland demonstrate no significant findings.  Physical Exam  BP 126/80   Pulse 73   Ht 5' 9 (1.753 m)   Wt (!) 344 lb (156 kg)   SpO2 96%   BMI 50.80 kg/m  Constitutional: well developed, well nourished Head: normocephalic, atraumatic, no  exophthalmos Eyes: sclera anicteric, no redness Neck: - thyromegaly, no thyroid  tenderness; no nodules palpated Lungs: normal respiratory effort Neurology: alert and oriented, no fine hand tremor Skin: dry, no appreciable rashes Musculoskeletal: no appreciable defects Psychiatric: normal mood and affect  Allergies Allergies  Allergen Reactions   Ace Inhibitors Anaphylaxis and Swelling    Angioedema   Isosorbide  Anaphylaxis   Bidil [Isosorb Dinitrate-Hydralazine ] Other (See Comments)    headache   Digoxin  And Related     Unspecified side effects   Buspirone Other (See Comments)    dizziness    Current Medications Patient's Medications  New Prescriptions   No medications on file  Previous Medications   ALBUTEROL  (VENTOLIN  HFA) 108 (90 BASE) MCG/ACT INHALER    INHALE 1-2 PUFFS INTO THE LUNGS EVERY 4 (FOUR) HOURS AS NEEDED FOR SHORTNESS OF BREATH OR WHEEZING.   AMIODARONE  (PACERONE ) 200 MG TABLET    Take 0.5 tablets (100 mg total) by mouth daily. Patient taking once daily   ATORVASTATIN  (LIPITOR) 40 MG TABLET    Take 1 tablet (40 mg total) by mouth daily.   CARVEDILOL  (COREG ) 6.25 MG TABLET    Take 1 tablet (6.25 mg total) by mouth 2 (two) times daily.   DAPAGLIFLOZIN  PROPANEDIOL (FARXIGA ) 10 MG TABS TABLET    Take 1 tablet (10 mg total) by mouth daily.   DOXEPIN  HCL 6 MG TABS    Take 1 tablet (6 mg total) by mouth at bedtime as needed.   HYDROCODONE -ACETAMINOPHEN  (NORCO/VICODIN) 5-325 MG TABLET    Take 1 tablet by mouth every 4 (four) hours as needed.   PANTOPRAZOLE  (PROTONIX ) 40 MG TABLET    TAKE 1 TABLET (40 MG TOTAL) BY MOUTH DAILY. SCHEDULE AN APPT FOR FURTHER REFILLS   POTASSIUM CHLORIDE  SA (KLOR-CON  M) 20 MEQ TABLET    Take 2 tablets (40 mEq total) by mouth daily.   PRESCRIPTION MEDICATION    Inhale into the lungs See admin instructions. Bipap, pressure 16/12 with 2L of O2 - use whenever sleeping   SILDENAFIL  (VIAGRA ) 50 MG TABLET    Take 50 mg by mouth as needed for  erectile dysfunction.   SPIRONOLACTONE  (ALDACTONE ) 25 MG TABLET    Take 1 tablet (25 mg total) by mouth at bedtime.   TIRZEPATIDE  (MOUNJARO ) 2.5 MG/0.5ML PEN    INJECT 2.5 MG SUBCUTANEOUSLY WEEKLY   TORSEMIDE  (DEMADEX ) 20 MG TABLET    Take 2 tablets (40 mg total) by mouth every morning   XARELTO  20 MG TABS TABLET    TAKE 1 TABLET BY MOUTH DAILY WITH SUPPER.  Modified Medications   Modified Medication Previous Medication   METHIMAZOLE  (TAPAZOLE ) 5 MG TABLET methimazole  (TAPAZOLE ) 5 MG tablet      Take 0.5 tablets (2.5 mg total) by mouth daily.    Take 1 tablet (5 mg total) by mouth daily.  Discontinued Medications   No medications on file    Past Medical History Past Medical History:  Diagnosis Date   Alcohol abuse    Anxiety state, unspecified    Atrial fibrillation (HCC)    CHF (congestive heart failure) (HCC)    Chronic systolic heart failure (HCC)    Diabetes  mellitus, type II (HCC)    Edema    Gout    History of medication noncompliance    Migraine    Obesity, unspecified    Obstructive sleep apnea    Psychiatric disorder    Pulmonary embolism (HCC)    Shortness of breath     Past Surgical History Past Surgical History:  Procedure Laterality Date   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   Procedure: BIV ICD INSERTION CRT-D;  Surgeon: Cindie Ole DASEN, MD;  Location: [facility] INVASIVE CV LAB;  Service: Cardiovascular;  Laterality: N/A;   CARDIAC CATHETERIZATION     CARDIAC CATHETERIZATION N/A 08/13/2015   Procedure: Right/Left Heart Cath and Coronary Angiography;  Surgeon: Ezra GORMAN Shuck, MD;  Location: [facility] INVASIVE CV LAB;  Service: Cardiovascular;  Laterality: N/A;   PACEMAKER INSERTION     RIGHT HEART CATH N/A 09/15/2023   Procedure: RIGHT HEART CATH;  Surgeon: Shuck Ezra GORMAN, MD;  Location: [facility] INVASIVE CV LAB;  Service: Cardiovascular;  Laterality: N/A;   RIGHT/LEFT HEART CATH AND CORONARY ANGIOGRAPHY N/A 04/14/2017   Procedure: Right/Left Heart Cath and Coronary  Angiography;  Surgeon: Shuck Ezra GORMAN, MD;  Location: [facility] INVASIVE CV LAB;  Service: Cardiovascular;  Laterality: N/A;   TESTICLE SURGERY      Family History family history includes Cancer in his mother; Diabetes in his daughter, father, and another family member; Heart disease in his maternal grandmother; Hypertension in his mother and another family member; Stroke in an other family member.  Social History Social History   Socioeconomic History   Marital status: Divorced    Spouse name: Not on file   Number of children: 3   Years of education: Not on file   Highest education level: Not on file  Occupational History   Occupation: DISABLED  Tobacco Use   Smoking status: Some Days    Current packs/day: 0.00    Average packs/day: 0.5 packs/day for 30.0 years (15.0 ttl pk-yrs)    Types: Cigarettes    Start date: 05/15/1990    Last attempt to quit: 05/15/2020    Years since quitting: 4.1   Smokeless tobacco: Never   Tobacco comments:    vaping - nicotine -free products  Vaping Use   Vaping status: Never Used  Substance and Sexual Activity   Alcohol use: Not Currently    Alcohol/week: 0.0 standard drinks of alcohol   Drug use: No   Sexual activity: Not Currently  Other Topics Concern   Not on file  Social History Narrative   He smokes about a pack per day and he has been smoking since he was 58 years of age.  He drinks alcohol occasionally, but he denies any illicit drug abuse.  He is presently on disability.    Lives with wife in a 2 story home.  Has 2 children.   Previously worked in Office manager, last worked in 1998.   Highest level of education:  11th grade      Lives with his daughter's mother, for right now.  Recently divorced. (2024).        Social Drivers of Health   Financial Resource Strain: Medium Risk (09/17/2023)   Overall Financial Resource Strain (CARDIA)    Difficulty of Paying Living Expenses: Somewhat hard  Food Insecurity: Food Insecurity Present  (09/17/2023)   Hunger Vital Sign    Worried About Running Out of Food in the Last Year: Sometimes true    Ran Out of Food in the Last Year: Sometimes true  Transportation Needs: No Transportation Needs (09/17/2023)   PRAPARE - Administrator, Civil Service (Medical): No    Lack of Transportation (Non-Medical): No  Physical Activity: Inactive (09/17/2023)   Exercise Vital Sign    Days of Exercise per Week: 0 days    Minutes of Exercise per Session: 0 min  Stress: Stress Concern Present (09/17/2023)   [employer] of Occupational Health - Occupational Stress Questionnaire    Feeling of Stress : Rather much  Social Connections: Socially Isolated (09/17/2023)   Social Connection and Isolation Panel    Frequency of Communication with Friends and Family: More than three times a week    Frequency of Social Gatherings with Friends and Family: Never    Attends Religious Services: Never    Database administrator or Organizations: No    Attends Banker Meetings: Never    Marital Status: Divorced  Catering manager Violence: Not At Risk (11/24/2023)   Received from [facility]   HITS    Over the last 12 months how often did your partner physically hurt you?: Never    Over the last 12 months how often did your partner insult you or talk down to you?: Never    Over the last 12 months how often did your partner threaten you with physical harm?: Never    Over the last 12 months how often did your partner scream or curse at you?: Never    Laboratory Investigations Lab Results  Component Value Date   TSH 6.705 (H) 06/30/2024   TSH 4.13 03/22/2024   TSH 4.457 02/22/2024   FREET4 1.10 06/30/2024   FREET4 1.2 03/22/2024   FREET4 0.93 01/18/2024     Lab Results  Component Value Date   TSI 102 12/22/2023     No components found for: TRAB   Lab Results  Component Value Date   CHOL 140 12/16/2023   Lab Results  Component Value Date   HDL 35 (L) 12/16/2023    Lab Results  Component Value Date   LDLCALC 83 12/16/2023   Lab Results  Component Value Date   TRIG 123 12/16/2023   Lab Results  Component Value Date   CHOLHDL 4.0 12/16/2023   Lab Results  Component Value Date   CREATININE 1.45 (H) 06/30/2024   Lab Results  Component Value Date   GFR 40.56 (L) 02/06/2023      Component Value Date/Time   NA 138 06/30/2024 1134   K 4.4 06/30/2024 1134   CL 105 06/30/2024 1134   CO2 24 06/30/2024 1134   GLUCOSE 98 06/30/2024 1134   BUN 18 06/30/2024 1134   CREATININE 1.45 (H) 06/30/2024 1134   CREATININE 1.61 (H) 12/16/2023 1012   CALCIUM  8.9 06/30/2024 1134   PROT 6.8 06/30/2024 1134   ALBUMIN  3.3 (L) 06/30/2024 1134   AST 17 06/30/2024 1134   ALT 15 06/30/2024 1134   ALKPHOS 117 06/30/2024 1134   BILITOT 1.0 06/30/2024 1134   GFRNONAA 57 (L) 06/30/2024 1134   GFRAA >60 05/15/2020 1452      Latest Ref Rng & Units 06/30/2024   11:34 AM 05/21/2024    5:50 AM 02/22/2024   10:18 AM  BMP  Glucose 70 - 99 mg/dL 98  877  879   BUN 6 - 20 mg/dL 18  19  27    Creatinine 0.61 - 1.24 mg/dL 8.54  8.66  8.22   Sodium 135 - 145 mmol/L 138  135  136  Potassium 3.5 - 5.1 mmol/L 4.4  4.1  3.7   Chloride 98 - 111 mmol/L 105  106  107   CO2 22 - 32 mmol/L 24  21  20    Calcium  8.9 - 10.3 mg/dL 8.9  8.6  8.7        Component Value Date/Time   WBC 9.4 05/21/2024 0550   RBC 4.90 05/21/2024 0550   HGB 14.0 05/21/2024 0550   HCT 40.0 05/21/2024 0550   PLT 264 05/21/2024 0550   MCV 81.6 05/21/2024 0550   MCH 28.6 05/21/2024 0550   MCHC 35.0 05/21/2024 0550   RDW 14.6 05/21/2024 0550   LYMPHSABS 3.3 09/29/2023 0938   MONOABS 0.6 09/29/2023 0938   EOSABS 0.9 (H) 09/29/2023 0938   BASOSABS 0.1 09/29/2023 0938      Parts of this note may have been dictated using voice recognition software. There may be variances in spelling and vocabulary which are unintentional. Not all errors are proofread. Please notify the dino if any discrepancies  are noted or if the meaning of any statement is not clear.

## 2024-07-11 ENCOUNTER — Encounter: Payer: Self-pay | Admitting: Cardiology

## 2024-07-26 ENCOUNTER — Ambulatory Visit (INDEPENDENT_AMBULATORY_CARE_PROVIDER_SITE_OTHER): Payer: Medicare HMO

## 2024-07-26 DIAGNOSIS — G4733 Obstructive sleep apnea (adult) (pediatric): Secondary | ICD-10-CM | POA: Diagnosis not present

## 2024-07-26 DIAGNOSIS — I5022 Chronic systolic (congestive) heart failure: Secondary | ICD-10-CM | POA: Diagnosis not present

## 2024-07-27 ENCOUNTER — Ambulatory Visit: Attending: Cardiology

## 2024-07-27 ENCOUNTER — Ambulatory Visit: Payer: Self-pay | Admitting: Cardiology

## 2024-07-27 DIAGNOSIS — I5022 Chronic systolic (congestive) heart failure: Secondary | ICD-10-CM | POA: Diagnosis not present

## 2024-07-27 DIAGNOSIS — Z95810 Presence of automatic (implantable) cardiac defibrillator: Secondary | ICD-10-CM

## 2024-07-27 LAB — CUP PACEART REMOTE DEVICE CHECK
Battery Remaining Longevity: 53 mo
Battery Remaining Percentage: 62 %
Battery Voltage: 2.96 V
Brady Statistic AP VP Percent: 72 %
Brady Statistic AP VS Percent: 2.5 %
Brady Statistic AS VP Percent: 25 %
Brady Statistic AS VS Percent: 1 %
Brady Statistic RA Percent Paced: 73 %
Date Time Interrogation Session: 20250826020414
HighPow Impedance: 73 Ohm
Implantable Lead Connection Status: 753985
Implantable Lead Connection Status: 753985
Implantable Lead Connection Status: 753985
Implantable Lead Implant Date: 20230213
Implantable Lead Implant Date: 20230213
Implantable Lead Implant Date: 20230213
Implantable Lead Location: 753857
Implantable Lead Location: 753859
Implantable Lead Location: 753860
Implantable Pulse Generator Implant Date: 20230213
Lead Channel Impedance Value: 430 Ohm
Lead Channel Impedance Value: 440 Ohm
Lead Channel Impedance Value: 910 Ohm
Lead Channel Pacing Threshold Amplitude: 0.75 V
Lead Channel Pacing Threshold Amplitude: 0.75 V
Lead Channel Pacing Threshold Amplitude: 0.75 V
Lead Channel Pacing Threshold Pulse Width: 0.5 ms
Lead Channel Pacing Threshold Pulse Width: 0.5 ms
Lead Channel Pacing Threshold Pulse Width: 0.5 ms
Lead Channel Sensing Intrinsic Amplitude: 11.8 mV
Lead Channel Sensing Intrinsic Amplitude: 3 mV
Lead Channel Setting Pacing Amplitude: 1.5 V
Lead Channel Setting Pacing Amplitude: 2 V
Lead Channel Setting Pacing Amplitude: 2.5 V
Lead Channel Setting Pacing Pulse Width: 0.5 ms
Lead Channel Setting Pacing Pulse Width: 0.5 ms
Lead Channel Setting Sensing Sensitivity: 0.5 mV
Pulse Gen Serial Number: 210000080
Zone Setting Status: 755011

## 2024-07-28 NOTE — Progress Notes (Signed)
 EPIC Encounter for ICM Monitoring  Patient Name: [name] is a 58 y.o. male Date: 07/28/2024 Primary Care Physican: Joshua Debby LITTIE, MD Primary Cardiologist: Rolan Electrophysiologist: Cindie Pore Pacing: 96% 02/04/2023 Weight:  358 lbs       06/01/2023 Weight: 328 lbs (losing weight taking Mounjaro )   06/09/2023 Weight: 328 lbs             07/07/2023 Weight: 308 lbs   9/10/202 Weight: 308 lbs      09/16/2023 Weight: 317 lbs    12/29/2023 Office Weight: 311 lbs 02/22/2024 Office Weight: 330 lbs 03/30/2024 Weight: 332 lbs (was up to 336 lbs) 04/01/2024 Weight:  331 lbs 07/04/2024 Office Weight: 344 lbs   Spoke with patient and heart failure questions reviewed.  Transmission results reviewed.  Pt reports he is doing well and no fluid symptoms.   Diet:  He reports he stays thirsty and drinks a lot of fluids.     CorVue thoracic impedance suggesting normal fluid levels within the last month. .   Prescribed:  Torsemide  20 mg Take 2 tablet(s) (40 mg total) every morning. Potassium 20 mEq take 2 tablet(s) (40 mEq total) by mouth daily.   Spironolactone  25 mg take 1 tablet daily   Labs: 06/30/2024 Creatinine 1.45, BUN 18, Potassium 4.4, Sodium 138, GFR 57 05/21/2024 Creatinine 1.33,BUN 19, Potassium 4.1, Sodium 135, GFR >60 02/22/2024 Creatinine 1.77, BUN 27, Potassium 3.7, Sodium 136, GFR 45 01/18/2024 Creatinine 1.56, BUN 24, Potassium 4.5, Sodium 135, GFR 52  12/29/2023 Creatinine 1.79, BUN 15, Potassium 3.0, Sodium 142, GFR 44 (Prescribed potassium) 12/16/2023 Creatinine 1.61, BUN 20, Potassium 3.7, Sodium 141 12/04/2023 Creatinine 1.51, BUN 23, Potassium 3.7, Sodium 136, GFR 54 A complete set of results can be found in Results Review.   Recommendations:   No changes and encouraged to call if experiencing any fluid symptoms.   Follow-up plan: ICM clinic phone appointment 08/29/2024.   91 day device clinic remote transmission 10/25/2024.     EP/Cardiology Office Visits:  Recall  09/28/2024 with HF Clinic.  Recall 08/09/2023 with Charlies Arthur, PA   Copy of ICM check sent to Dr. Cindie.  3 month ICM trend: 07/27/2024.    12-14 Month ICM trend:     [name], RN 07/28/2024 3:58 PM

## 2024-08-04 ENCOUNTER — Ambulatory Visit: Payer: Self-pay

## 2024-08-04 NOTE — Telephone Encounter (Signed)
 Patient has been scheduled for an appointment.

## 2024-08-04 NOTE — Telephone Encounter (Signed)
 FYI Only or Action Required?: Action required by provider: request for appointment.  Patient was last seen in primary care on 05/26/2024 by [name], MD.  Called Nurse Triage reporting Extremity Weakness.  Symptoms began several days ago.  Interventions attempted: Rest, hydration, or home remedies.  Symptoms are: completely resolved.  Triage Disposition: See HCP Within 4 Hours (Or PCP Triage)  Patient/caregiver understands and will follow disposition?: No, refuses disposition   Copied from CRM #[id number]. Topic: Clinical - Red Word Triage >> Aug 04, 2024  1:34 PM Lauren C wrote: Red Word that prompted transfer to Nurse Triage: Pt having periods of time where his body is becoming paralyzed. He went to the hospital for this a couple months ago and it went away, but then happened again a few days ago. Reason for Disposition  History of prior blood clot in leg or lungs (i.e., deep vein thrombosis, pulmonary embolism)  Additional Information  Negative: [1] MODERATE weakness (e.g., interferes with work, school, normal activities) AND [2] persists > 3 days    Now resolved but had weakness Sunday(severe)-Wednesday(resolved).  Answer Assessment - Initial Assessment Questions 1. DESCRIPTION: Describe how you are feeling.     When he woke up felt like his legs were being pulled hip to thigh, he ambulated down the stairs, walked to friends home, while there he started having trouble moving legs due to heaviness, needed help to get back home and son needed to help his lift his leg. Left sided hip and leg.  2. SEVERITY: How bad is it?  Can you stand and walk?     Sunday night was unable to get out of bed.  3. ONSET: When did these symptoms begin? (e.g., hours, days, weeks, months)     Sunday 4. CAUSE: What do you think is causing the weakness or fatigue? (e.g., not drinking enough fluids, medical problem, trouble sleeping)     Unsure, this happened a few months back as well.  5.  NEW MEDICINES:  Have you started on any new medicines recently? (e.g., opioid pain medicines, benzodiazepines, muscle relaxants, antidepressants, antihistamines, neuroleptics, beta blockers)      6. OTHER SYMPTOMS: Do you have any other symptoms? (e.g., chest pain, fever, cough, SOB, vomiting, diarrhea, bleeding, other areas of pain)  Denies  Answer Assessment - Initial Assessment Questions Additional info: 1) Was evaluated 05/21/24 at ER for the same exact symptoms 2) Patient requests follow up with PCP, none are available until October. Requests to be worked in to [employer] schedule, has already seen an alternate provider in office and feels he needs pcp.  3) Symptoms have resolved since 08/03/24. Agreeable to ER if symptoms reoccur.    1. ONSET: When did the pain start?      Sunday 2. LOCATION: Where is the pain located?      Bilateral legs  3. PAIN: How bad is the pain?    (Scale 1-10; or mild, moderate, severe)     Now resolved. Was severe Sunday-Wednesday 4. WORK OR EXERCISE: Has there been any recent work or exercise that involved this part of the body?      Denies  5. CAUSE: What do you think is causing the leg pain?     Unsure  6. OTHER SYMPTOMS: Do you have any other symptoms? (e.g., chest pain, back pain, breathing difficulty, swelling, rash, fever, numbness, weakness)     Minor sore throat  Protocols used: Weakness (Generalized) and Fatigue-A-AH, Leg Pain-A-AH

## 2024-08-07 ENCOUNTER — Encounter: Payer: Self-pay | Admitting: Internal Medicine

## 2024-08-07 NOTE — Progress Notes (Unsigned)
 Subjective:    Patient ID: [name], male    DOB: 11-Jan-1966, 58 y.o.   MRN: [id number]      HPI [name] is here for No chief complaint on file.   6/ 21/25-went to the ED for left lower back, groin and leg pain.  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  Prednisone , Toradol  and morphine  given pain had improved.  He was advised to follow-up with neurosurgery.  He was discharged on a 5-day course of prednisone  and a few tablets of Norco.        Medications and allergies reviewed with patient and updated if appropriate.  Current Outpatient Medications on File Prior to Visit  Medication Sig Dispense Refill   albuterol  (VENTOLIN  HFA) 108 (90 Base) MCG/ACT inhaler INHALE 1-2 PUFFS INTO THE LUNGS EVERY 4 (FOUR) HOURS AS NEEDED FOR SHORTNESS OF BREATH OR WHEEZING. 8.5 each 1   amiodarone  (PACERONE ) 200 MG tablet Take 0.5 tablets (100 mg total) by mouth daily. Patient taking once daily 15 tablet 6   atorvastatin  (LIPITOR) 40 MG tablet Take 1 tablet (40 mg total) by mouth daily. 90 tablet 3   carvedilol  (COREG ) 6.25 MG tablet Take 1 tablet (6.25 mg total) by mouth 2 (two) times daily. 60 tablet 11   dapagliflozin  propanediol (FARXIGA ) 10 MG TABS tablet Take 1 tablet (10 mg total) by mouth daily. 90 tablet 1   Doxepin  HCl 6 MG TABS Take 1 tablet (6 mg total) by mouth at bedtime as needed. 30 tablet 1   HYDROcodone -acetaminophen  (NORCO/VICODIN) 5-325 MG tablet Take 1 tablet by mouth every 4 (four) hours as needed. 8 tablet 0   methimazole  (TAPAZOLE ) 5 MG tablet Take 0.5 tablets (2.5 mg total) by mouth daily. 45 tablet 1   pantoprazole  (PROTONIX ) 40 MG tablet TAKE 1 TABLET (40 MG TOTAL) BY MOUTH DAILY. SCHEDULE AN APPT FOR FURTHER REFILLS 90 tablet 1   potassium chloride  SA (KLOR-CON  M) 20 MEQ tablet Take 2 tablets (40 mEq total) by mouth daily. 180 tablet 3   PRESCRIPTION MEDICATION  Inhale into the lungs See admin instructions. Bipap, pressure 16/12 with 2L of O2 - use whenever sleeping     sildenafil  (VIAGRA ) 50 MG tablet Take 50 mg by mouth as needed for erectile dysfunction.     spironolactone  (ALDACTONE ) 25 MG tablet Take 1 tablet (25 mg total) by mouth at bedtime. 90 tablet 3   tirzepatide  (MOUNJARO ) 2.5 MG/0.5ML Pen INJECT 2.5 MG SUBCUTANEOUSLY WEEKLY 2 mL 0   torsemide  (DEMADEX ) 20 MG tablet Take 2 tablets (40 mg total) by mouth every morning 60 tablet 6   XARELTO  20 MG TABS tablet TAKE 1 TABLET BY MOUTH DAILY WITH SUPPER. 90 tablet 0   [DISCONTINUED] pravastatin  (PRAVACHOL ) 40 MG tablet Take 40 mg by mouth daily.     No current facility-administered medications on file prior to visit.    Review of Systems     Objective:  There were no vitals filed for this visit. BP Readings from Last 3 Encounters:  07/04/24 126/80  06/30/24 103/75  05/26/24 (!) 118/90   Wt Readings from Last 3 Encounters:  07/04/24 (!) 344 lb (156 kg)  06/30/24 (!) 346 lb 3.2 oz (157 kg)  05/26/24 (!) 336 lb (152.4 kg)   There is no height or weight on file to calculate BMI.    Physical Exam         Assessment &  Plan:    See Problem List for Assessment and Plan of chronic medical problems.

## 2024-08-08 ENCOUNTER — Ambulatory Visit (INDEPENDENT_AMBULATORY_CARE_PROVIDER_SITE_OTHER): Admitting: Internal Medicine

## 2024-08-08 VITALS — BP 118/70 | HR 80 | Temp 98.0°F | Ht 69.0 in | Wt 342.0 lb

## 2024-08-08 DIAGNOSIS — M5416 Radiculopathy, lumbar region: Secondary | ICD-10-CM

## 2024-08-08 DIAGNOSIS — G5712 Meralgia paresthetica, left lower limb: Secondary | ICD-10-CM

## 2024-08-08 NOTE — Patient Instructions (Signed)
    Medications changes include :   None    A referral was ordered neurosurgery and someone will call you to schedule an appointment.

## 2024-08-09 ENCOUNTER — Other Ambulatory Visit (HOSPITAL_COMMUNITY): Payer: Self-pay | Admitting: Cardiology

## 2024-08-10 ENCOUNTER — Other Ambulatory Visit (HOSPITAL_COMMUNITY): Payer: Self-pay | Admitting: Family Medicine

## 2024-08-14 ENCOUNTER — Other Ambulatory Visit (HOSPITAL_COMMUNITY): Payer: Self-pay | Admitting: Family Medicine

## 2024-08-14 DIAGNOSIS — I10 Essential (primary) hypertension: Secondary | ICD-10-CM

## 2024-08-14 DIAGNOSIS — I428 Other cardiomyopathies: Secondary | ICD-10-CM

## 2024-08-16 NOTE — Progress Notes (Signed)
Remote ICD Transmission.

## 2024-08-21 ENCOUNTER — Other Ambulatory Visit: Payer: Self-pay | Admitting: Internal Medicine

## 2024-08-21 DIAGNOSIS — K219 Gastro-esophageal reflux disease without esophagitis: Secondary | ICD-10-CM
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
## 2024-08-29 ENCOUNTER — Ambulatory Visit: Attending: Cardiology

## 2024-08-29 DIAGNOSIS — I5022 Chronic systolic (congestive) heart failure: Secondary | ICD-10-CM

## 2024-08-29 DIAGNOSIS — Z95810 Presence of automatic (implantable) cardiac defibrillator: Secondary | ICD-10-CM | POA: Diagnosis not present

## 2024-08-31 ENCOUNTER — Telehealth: Admitting: Psychiatry

## 2024-08-31 ENCOUNTER — Encounter: Payer: Self-pay | Admitting: Psychiatry

## 2024-08-31 DIAGNOSIS — G4700 Insomnia, unspecified: Secondary | ICD-10-CM

## 2024-08-31 DIAGNOSIS — F3175 Bipolar disorder, in partial remission, most recent episode depressed: Secondary | ICD-10-CM

## 2024-08-31 DIAGNOSIS — F4310 Post-traumatic stress disorder, unspecified: Secondary | ICD-10-CM | POA: Diagnosis not present

## 2024-08-31 DIAGNOSIS — F410 Panic disorder [episodic paroxysmal anxiety] without agoraphobia: Secondary | ICD-10-CM | POA: Diagnosis not present

## 2024-08-31 NOTE — Progress Notes (Signed)
 EPIC Encounter for ICM Monitoring  Patient Name: [name] is a 58 y.o. male Date: 08/31/2024 Primary Care Physican: Joshua Debby LITTIE, MD Primary Cardiologist: Rolan Electrophysiologist: Cindie Pore Pacing: 96% 02/04/2023 Weight:  358 lbs       06/01/2023 Weight: 328 lbs (losing weight taking Mounjaro )   06/09/2023 Weight: 328 lbs             07/07/2023 Weight: 308 lbs   9/10/202 Weight: 308 lbs      09/16/2023 Weight: 317 lbs    12/29/2023 Office Weight: 311 lbs 02/22/2024 Office Weight: 330 lbs 03/30/2024 Weight: 332 lbs (was up to 336 lbs) 04/01/2024 Weight:  331 lbs 07/04/2024 Office Weight: 344 lbs   Spoke with patient and heart failure questions reviewed.  Transmission results reviewed.  Pt reports he is doing well and no fluid symptoms except feels really tired at times.   Diet:  He reports he stays thirsty and drinks a lot of fluids.     Since 07/27/2024 ICM Remote Transmission:  CorVue thoracic impedance suggesting normal fluid levels within the last month. .   Prescribed:  Torsemide  20 mg Take 2 tablet(s) (40 mg total) every morning. Potassium 20 mEq take 2 tablet(s) (40 mEq total) by mouth daily.   Spironolactone  25 mg take 1 tablet daily   Labs: 06/30/2024 Creatinine 1.45, BUN 18, Potassium 4.4, Sodium 138, GFR 57 05/21/2024 Creatinine 1.33,BUN 19, Potassium 4.1, Sodium 135, GFR >60 02/22/2024 Creatinine 1.77, BUN 27, Potassium 3.7, Sodium 136, GFR 45 01/18/2024 Creatinine 1.56, BUN 24, Potassium 4.5, Sodium 135, GFR 52  12/29/2023 Creatinine 1.79, BUN 15, Potassium 3.0, Sodium 142, GFR 44 (Prescribed potassium) 12/16/2023 Creatinine 1.61, BUN 20, Potassium 3.7, Sodium 141 12/04/2023 Creatinine 1.51, BUN 23, Potassium 3.7, Sodium 136, GFR 54 A complete set of results can be found in Results Review.   Recommendations:   No changes and encouraged to call if experiencing any fluid symptoms.   Follow-up plan: ICM clinic phone appointment 10/03/2024.   91 day device clinic  remote transmission 10/25/2024.     EP/Cardiology Office Visits:  Recall 09/28/2024 with HF Clinic.   Message sent to EP scheduler 08/31/2024 to call patient for overdue EP appt.  Recall 08/09/2023 with Charlies Arthur, PA   Copy of ICM check sent to Dr. Cindie.  Remote monitoring is medically necessary for Heart Failure Management.    90 day Daily Thoracic Impedance ICM trend: 05/31/2024 through 08/29/2024.    12-14 Month Thoracic Impedance ICM trend:     [name], RN 08/31/2024 2:35 PM

## 2024-09-02 DIAGNOSIS — M47816 Spondylosis without myelopathy or radiculopathy, lumbar region: Secondary | ICD-10-CM | POA: Diagnosis not present

## 2024-09-02 DIAGNOSIS — Z6841 Body Mass Index (BMI) 40.0 and over, adult: Secondary | ICD-10-CM | POA: Diagnosis not present

## 2024-09-16 ENCOUNTER — Encounter: Payer: Self-pay | Admitting: *Deleted

## 2024-09-16 NOTE — Progress Notes (Signed)
 [name]                                          MRN: [id number]   09/16/2024   The VBCI Quality Team Specialist reviewed this patient medical record for the purposes of chart review for care gap closure. The following were reviewed: chart review for care gap closure-kidney health evaluation for diabetes:eGFR  and uACR.    VBCI Quality Team

## 2024-09-17 ENCOUNTER — Other Ambulatory Visit: Payer: Self-pay | Admitting: "Endocrinology

## 2024-09-19 ENCOUNTER — Other Ambulatory Visit: Payer: Self-pay

## 2024-09-19 ENCOUNTER — Emergency Department (HOSPITAL_COMMUNITY)
Admission: EM | Admit: 2024-09-19 | Discharge: 2024-09-19 | Disposition: A | Discharge status: Home / Self Care | Attending: Emergency Medicine | Admitting: Emergency Medicine

## 2024-09-19 ENCOUNTER — Emergency Department (HOSPITAL_COMMUNITY)

## 2024-09-19 ENCOUNTER — Encounter (HOSPITAL_COMMUNITY): Payer: Self-pay

## 2024-09-19 DIAGNOSIS — R0602 Shortness of breath: Secondary | ICD-10-CM | POA: Diagnosis not present

## 2024-09-19 DIAGNOSIS — R059 Cough, unspecified: Secondary | ICD-10-CM | POA: Insufficient documentation

## 2024-09-19 DIAGNOSIS — R600 Localized edema: Secondary | ICD-10-CM | POA: Insufficient documentation

## 2024-09-19 DIAGNOSIS — Z950 Presence of cardiac pacemaker: Secondary | ICD-10-CM | POA: Diagnosis not present

## 2024-09-19 DIAGNOSIS — M79601 Pain in right arm: Secondary | ICD-10-CM | POA: Diagnosis not present

## 2024-09-19 DIAGNOSIS — M79602 Pain in left arm: Secondary | ICD-10-CM | POA: Diagnosis not present

## 2024-09-19 DIAGNOSIS — R5383 Other fatigue: Secondary | ICD-10-CM | POA: Diagnosis present

## 2024-09-19 DIAGNOSIS — I509 Heart failure, unspecified: Secondary | ICD-10-CM | POA: Diagnosis not present

## 2024-09-19 DIAGNOSIS — Z79899 Other long term (current) drug therapy: Secondary | ICD-10-CM | POA: Diagnosis not present

## 2024-09-19 DIAGNOSIS — Z7901 Long term (current) use of anticoagulants: Secondary | ICD-10-CM | POA: Diagnosis not present

## 2024-09-19 DIAGNOSIS — Z95810 Presence of automatic (implantable) cardiac defibrillator: Secondary | ICD-10-CM | POA: Diagnosis not present

## 2024-09-19 LAB — BASIC METABOLIC PANEL WITH GFR
Anion gap: 10 (ref 5–15)
BUN: 12 mg/dL (ref 6–20)
CO2: 19 mmol/L — ABNORMAL LOW (ref 22–32)
Calcium: 8.7 mg/dL — ABNORMAL LOW (ref 8.9–10.3)
Chloride: 107 mmol/L (ref 98–111)
Creatinine, Ser: 1.17 mg/dL (ref 0.61–1.24)
GFR, Estimated: >60 mL/min (ref >60)
Glucose, Bld: 105 mg/dL — ABNORMAL HIGH (ref 70–99)
Potassium: 3.6 mmol/L (ref 3.5–5.1)
Sodium: 136 mmol/L (ref 135–145)

## 2024-09-19 LAB — RESP PANEL BY RT-PCR (RSV, FLU A&B, COVID)  RVPGX2
Influenza A by PCR: NEGATIVE
Influenza B by PCR: NEGATIVE
Resp Syncytial Virus by PCR: NEGATIVE
SARS Coronavirus 2 by RT PCR: NEGATIVE

## 2024-09-19 LAB — CBC
HCT: 41.7 % (ref 39.0–52.0)
Hemoglobin: 14.3 g/dL (ref 13.0–17.0)
MCH: 27.3 pg (ref 26.0–34.0)
MCHC: 34.3 g/dL (ref 30.0–36.0)
MCV: 79.7 fL — ABNORMAL LOW (ref 80.0–100.0)
Platelets: 282 K/uL (ref 150–400)
RBC: 5.23 MIL/uL (ref 4.22–5.81)
RDW: 15 % (ref 11.5–15.5)
WBC: 6.7 K/uL (ref 4.0–10.5)
nRBC: 0 % (ref 0.0–0.2)

## 2024-09-19 LAB — TROPONIN I (HIGH SENSITIVITY)
Troponin I (High Sensitivity): 17 ng/L (ref <18)
Troponin I (High Sensitivity): 16 ng/L (ref <18)

## 2024-09-19 LAB — BRAIN NATRIURETIC PEPTIDE: B Natriuretic Peptide: 342.3 pg/mL — ABNORMAL HIGH (ref 0.0–100.0)

## 2024-09-19 MED ORDER — METHYLPREDNISOLONE 4 MG PO TBPK: ORAL_TABLET | ORAL | 0 refills | Status: DC

## 2024-09-19 NOTE — ED Triage Notes (Addendum)
 Pt c/o generalized pain moving around to different parts of body and fatigue for 2 days. Pt states he is also SOB and probably fluid overloaded, has not taken his diuretic in 2 days d/t feeling bad. Hx of CHF and Afib. C/o cough as well. Pt has defibrillator pacemaker and supposed to have 6 month check up tomorrow.

## 2024-09-19 NOTE — Progress Notes (Unsigned)
  Electrophysiology Office Note:   ID:  [name], DOB [birth year], MRN [id number]  Primary Cardiologist: Wilbert Bihari, MD Electrophysiologist: OLE ONEIDA HOLTS, MD  {Click to update primary MD,subspecialty MD or APP then REFRESH:1}    History of Present Illness:   [name] is a 58 y.o. male with h/o VT/VF, Obesity, and chronic systolic CHF s/p CRT-D seen today for routine electrophysiology followup.   Since last being seen in our clinic the patient reports doing ***.  he denies chest pain, palpitations, dyspnea, PND, orthopnea, nausea, vomiting, dizziness, syncope, edema, weight gain, or early satiety.   Review of systems complete and found to be negative unless listed in HPI.   EP Information / Studies Reviewed:    EKG is not ordered today. EKG from 09/19/2024 reviewed which showed NSR with appropriate LV pacing.        ICD Interrogation-  reviewed in detail today,  See PACEART report.  Arrhythmia/Device History St. Jude BiV ICD implanted 01/2022 for NICM / VF   Physical Exam:   VS:  There were no vitals taken for this visit.   Wt Readings from Last 3 Encounters:  09/19/24 (!) 340 lb (154.2 kg)  08/08/24 (!) 342 lb (155.1 kg)  07/04/24 (!) 344 lb (156 kg)     GEN: No acute distress *** NECK: No JVD; No carotid bruits CARDIAC: {EPRHYTHM:28826}, no murmurs, rubs, gallops RESPIRATORY:  Clear to auscultation without rales, wheezing or rhonchi  ABDOMEN: Soft, non-tender, non-distended EXTREMITIES:  {EDEMA LEVEL:28147::No} edema; No deformity   ASSESSMENT AND PLAN:    Chronic systolic CHF  s/p Abbott CRT-D  H/o VT/VF euvolemic today Stable on an appropriate medical regimen Normal ICD function See Pace Art report No changes today  Obesity There is no height or weight on file to calculate BMI.  Encouraged lifestyle modification   Disposition:   Follow up with {EPPROVIDERS:28135::EP Team} {EPFOLLOW UP:28173}   Signed, Ozell Prentice Passey, PA-C

## 2024-09-19 NOTE — ED Triage Notes (Signed)
 Patient reports pain started in his finger and moved to joints up his arm and to other side, states he was suspected of having gout previously but never put on meds for it.

## 2024-09-19 NOTE — ED Notes (Signed)
 Pt refused repeat blood work from ED triage phlebotomist. NT wheeled pt back out to lobby. Pt yelling and being belligerent towards ED staff.

## 2024-09-19 NOTE — Discharge Instructions (Signed)
 Please double up your torsemide  for the next couple days.  Let your cardiology clinic know, when they look at your pacemaker defibrillator tomorrow they can tell you how much fluid they think you have on you.  Please return for worsening pain fever inability eat or drink.

## 2024-09-19 NOTE — ED Provider Notes (Signed)
 [place] EMERGENCY DEPARTMENT AT [facility] Provider Note   CSN: [id number] Arrival date & time: 09/19/24  1227     Patient presents with: Shortness of Breath and Fatigue   [name] is a 58 y.o. male.   58 yo M with a chief complaints of arthralgias.  This is mostly to the arms bilaterally.  He said he noticed it first in the right second digit and then noticed on the ulnar aspect of the wrist pain to the elbow and the right trapezius.  He then felt like it moved to the other side of his body and to his including the left wrist and left hand left elbow.  He has felt very fatigued and has not been taking his medications due to this.  Says he has a history of heart failure and has not been taking his diuretics for medication for A-fib.  He has been coughing a little bit which he thinks is consistent with fluid overload.  No known sick contacts.  No fevers.  He is follow-up for a pacemaker check tomorrow.   Shortness of Breath      Prior to Admission medications   Medication Sig Start Date End Date Taking? Authorizing Provider  methylPREDNISolone  (MEDROL  DOSEPAK) 4 MG TBPK tablet Day 1: 8mg  before breakfast, 4 mg after lunch, 4 mg after supper, and 8 mg at bedtime Day 2: 4 mg before breakfast, 4 mg after lunch, 4 mg  after supper, and 8 mg  at bedtime Day 3:  4 mg  before breakfast, 4 mg  after lunch, 4 mg after supper, and 4 mg  at bedtime Day 4: 4 mg  before breakfast, 4 mg  after lunch, and 4 mg at bedtime Day 5: 4 mg  before breakfast and 4 mg at bedtime Day 6: 4 mg  before breakfast 09/19/24  Yes Jackelyn Illingworth, DO  albuterol  (VENTOLIN  HFA) 108 (90 Base) MCG/ACT inhaler INHALE 1-2 PUFFS INTO THE LUNGS EVERY 4 (FOUR) HOURS AS NEEDED FOR SHORTNESS OF BREATH OR WHEEZING. 06/04/22   Rolan Ezra RAMAN, MD  amiodarone  (PACERONE ) 200 MG tablet Take 0.5 tablets (100 mg total) by mouth daily. PLEASE SCHEDULE FOLLOW UP FOR MORE RERFILLS 08/09/24   Rolan Ezra RAMAN, MD   atorvastatin  (LIPITOR) 40 MG tablet Take 1 tablet (40 mg total) by mouth daily. 06/16/23   Milford, Harlene HERO, FNP  carvedilol  (COREG ) 6.25 MG tablet Take 1 tablet (6.25 mg total) by mouth 2 (two) times daily. 06/16/23   Milford, Harlene HERO, FNP  dapagliflozin  propanediol (FARXIGA ) 10 MG TABS tablet Take 1 tablet (10 mg total) by mouth daily. 06/16/23   Milford, Harlene HERO, FNP  Doxepin  HCl 6 MG TABS Take 1 tablet (6 mg total) by mouth at bedtime as needed. 06/29/24 08/28/24  Vickey Mettle, MD  HYDROcodone -acetaminophen  (NORCO/VICODIN) 5-325 MG tablet Take 1 tablet by mouth every 4 (four) hours as needed. 05/21/24   Robinson, John K, PA-C  methimazole  (TAPAZOLE ) 5 MG tablet TAKE 1 TABLET (5 MG TOTAL) BY MOUTH DAILY. 09/19/24   Motwani, Komal, MD  pantoprazole  (PROTONIX ) 40 MG tablet TAKE 1 TABLET (40 MG TOTAL) BY MOUTH DAILY. SCHEDULE AN APPT FOR FURTHER REFILLS 08/25/24   Joshua Debby LITTIE, MD  potassium chloride  SA (KLOR-CON  M) 20 MEQ tablet TAKE 2 TABLETS BY MOUTH DAILY 08/10/24   Milford, Harlene HERO, FNP  PRESCRIPTION MEDICATION Inhale into the lungs See admin instructions. Bipap, pressure 16/12 with 2L of O2 - use whenever sleeping  [provider]  sildenafil  (VIAGRA ) 50 MG tablet Take 50 mg by mouth as needed for erectile dysfunction.    [provider]  spironolactone  (ALDACTONE ) 25 MG tablet TAKE 1 TABLET BY MOUTH EVERYDAY AT BEDTIME 08/15/24   Colletta Manuelita Garre, PA-C  tirzepatide  (MOUNJARO ) 2.5 MG/0.5ML Pen INJECT 2.5 MG SUBCUTANEOUSLY WEEKLY 06/07/24   Rolan Ezra RAMAN, MD  torsemide  (DEMADEX ) 20 MG tablet Take 2 tablets (40 mg total) by mouth every morning 02/22/24   McLean, Dalton S, MD  XARELTO  20 MG TABS tablet TAKE 1 TABLET BY MOUTH DAILY WITH SUPPER. 05/31/24   Joshua Debby CROME, MD  pravastatin  (PRAVACHOL ) 40 MG tablet Take 40 mg by mouth daily.  02/20/12  [provider]    Allergies: Ace inhibitors, Isosorbide , Bidil [isosorb dinitrate-hydralazine ], Digoxin  and  related, and Buspirone    Review of Systems  Respiratory:  Positive for shortness of breath.     Updated Vital Signs BP 103/72   Pulse 73   Temp 97.8 F (36.6 C)   Resp 20   Ht 5' 9 (1.753 m)   Wt (!) 154.2 kg   SpO2 100%   BMI 50.21 kg/m   Physical Exam Vitals and nursing note reviewed.  Constitutional:      Appearance: He is well-developed.     Comments: BMI 50  HENT:     Head: Normocephalic and atraumatic.  Eyes:     Pupils: Pupils are equal, round, and reactive to light.  Neck:     Vascular: No JVD.  Cardiovascular:     Rate and Rhythm: Normal rate and regular rhythm.     Heart sounds: No murmur heard.    No friction rub. No gallop.  Pulmonary:     Effort: No respiratory distress.     Breath sounds: No wheezing.  Abdominal:     General: There is no distension.     Tenderness: There is no abdominal tenderness. There is no guarding or rebound.  Musculoskeletal:        General: Normal range of motion.     Cervical back: Normal range of motion and neck supple.     Right lower leg: Edema present.     Left lower leg: Edema present.     Comments: Trace bilateral lower extremity edema.  I am able to range all the joints in question.  There is no erythema warmth.  Pulse motor and sensation intact bilateral upper extremities.  He does have pain over the right trapezius muscle belly.  Extends down the back and just lateral to midline along the T-spine.  Skin:    Coloration: Skin is not pale.     Findings: No rash.  Neurological:     Mental Status: He is alert and oriented to person, place, and time.  Psychiatric:        Behavior: Behavior normal.     (all labs ordered are listed, but only abnormal results are displayed) Labs Reviewed  BASIC METABOLIC PANEL WITH GFR - Abnormal; Notable for the following components:      Result Value   CO2 19 (*)    Glucose, Bld 105 (*)    Calcium  8.7 (*)    All other components within normal limits  CBC - Abnormal; Notable  for the following components:   MCV 79.7 (*)    All other components within normal limits  BRAIN NATRIURETIC PEPTIDE - Abnormal; Notable for the following components:   B Natriuretic Peptide 342.3 (*)  All other components within normal limits  RESP PANEL BY RT-PCR (RSV, FLU A&B, COVID)  RVPGX2  TROPONIN I (HIGH SENSITIVITY)  TROPONIN I (HIGH SENSITIVITY)    EKG: EKG Interpretation Date/Time:  Monday September 19 2024 13:05:57 EDT Ventricular Rate:  77 PR Interval:  162 QRS Duration:  130 QT Interval:  442 QTC Calculation: 500 R Axis:   129  Text Interpretation: Atrial-sensed ventricular-paced rhythm Abnormal ECG No significant change since last tracing Confirmed by Emil Share [phone]) on 09/19/2024 4:48:55 PM  Radiology: DG Chest 2 View Result Date: 09/19/2024 CLINICAL DATA:  Shortness of breath and fatigue. EXAM: CHEST - 2 VIEW COMPARISON:  10/02/2023. FINDINGS: Trachea is midline. Heart is enlarged, stable. Pacemaker and ICD lead tips are in the right atrium and right ventricle as well as expected location of the coronary sinus. Mild interstitial prominence and indistinctness. Peripheral septal lines in the right lung base. Probable small right pleural effusion. IMPRESSION: Mild congestive heart failure. Electronically Signed   By: Newell Eke M.D.   On: 09/19/2024 14:49     Procedures   Medications Ordered in the ED - No data to display                                  Medical Decision Making Amount and/or Complexity of Data Reviewed Labs: ordered. Radiology: ordered.  Risk Prescription drug management.   57 yo M with a chief complaints of bilateral arm pain.  This been going on for a couple days now.  He felt like it made him very fatigued and he has not been able to take his medications at home.  I was discussing with him that clinically it said like he may have a viral syndrome if he has cough and diffuse myalgias/arthralgias.  He then endorsed that he had  played in what sounds like a multiple day videogame tournament.  I think perhaps the patient had strained multiple muscles as this seems to spare the lower extremities and is mostly along the upper extremities and the back.  Trial Medrol  Dosepak.  He feels he is fluid overloaded though not requiring oxygen  no tachypnea.  Will have him double his diuretic for a few days.  Has a pacemaker defibrillator check tomorrow.  Troponin negative.  BNP mildly elevated, seems consistent with baseline.  COVID flu and RSV are negative. Chest x-ray independently interpreted by me without obvious focal infiltrates or pneumothorax.  5:29 PM:  I have discussed the diagnosis/risks/treatment options with the patient.  Evaluation and diagnostic testing in the emergency department does not suggest an emergent condition requiring admission or immediate intervention beyond what has been performed at this time.  They will follow up with PCP. We also discussed returning to the ED immediately if new or worsening sx occur. We discussed the sx which are most concerning (e.g., sudden worsening pain, fever, inability to tolerate by mouth) that necessitate immediate return. Medications administered to the patient during their visit and any new prescriptions provided to the patient are listed below.  Medications given during this visit Medications - No data to display   The patient appears reasonably screen and/or stabilized for discharge and I doubt any other medical condition or other [facility] requiring further screening, evaluation, or treatment in the ED at this time prior to discharge.        Final diagnoses:  Pain in both upper extremities    ED Discharge Orders  Ordered    methylPREDNISolone  (MEDROL  DOSEPAK) 4 MG TBPK tablet        09/19/24 1713               Emil Share, DO 09/19/24 1729

## 2024-09-20 ENCOUNTER — Ambulatory Visit: Attending: Student | Admitting: Student

## 2024-09-20 ENCOUNTER — Encounter: Payer: Self-pay | Admitting: Student

## 2024-09-20 VITALS — BP 102/68 | HR 73 | Ht 69.0 in | Wt 349.1 lb

## 2024-09-20 DIAGNOSIS — I472 Ventricular tachycardia, unspecified: Secondary | ICD-10-CM | POA: Diagnosis not present

## 2024-09-20 DIAGNOSIS — I5022 Chronic systolic (congestive) heart failure: Secondary | ICD-10-CM

## 2024-09-20 LAB — CUP PACEART INCLINIC DEVICE CHECK
Battery Remaining Longevity: 51 mo
Brady Statistic RA Percent Paced: 73 %
Brady Statistic RV Percent Paced: 96 %
Date Time Interrogation Session: 20251021100319
HighPow Impedance: 70.875
Implantable Lead Connection Status: 753985
Implantable Lead Connection Status: 753985
Implantable Lead Connection Status: 753985
Implantable Lead Implant Date: 20230213
Implantable Lead Implant Date: 20230213
Implantable Lead Implant Date: 20230213
Implantable Lead Location: 753857
Implantable Lead Location: 753859
Implantable Lead Location: 753860
Implantable Pulse Generator Implant Date: 20230213
Lead Channel Impedance Value: 1062.5 Ohm
Lead Channel Impedance Value: 412.5 Ohm
Lead Channel Impedance Value: 412.5 Ohm
Lead Channel Pacing Threshold Amplitude: 0.75 V
Lead Channel Pacing Threshold Amplitude: 0.75 V
Lead Channel Pacing Threshold Amplitude: 0.75 V
Lead Channel Pacing Threshold Amplitude: 0.75 V
Lead Channel Pacing Threshold Amplitude: 1 V
Lead Channel Pacing Threshold Amplitude: 1 V
Lead Channel Pacing Threshold Pulse Width: 0.5 ms
Lead Channel Pacing Threshold Pulse Width: 0.5 ms
Lead Channel Pacing Threshold Pulse Width: 0.5 ms
Lead Channel Pacing Threshold Pulse Width: 0.5 ms
Lead Channel Pacing Threshold Pulse Width: 0.5 ms
Lead Channel Pacing Threshold Pulse Width: 0.5 ms
Lead Channel Sensing Intrinsic Amplitude: 1.4 mV
Lead Channel Sensing Intrinsic Amplitude: 11.8 mV
Lead Channel Setting Pacing Amplitude: 1.5 V
Lead Channel Setting Pacing Amplitude: 2 V
Lead Channel Setting Pacing Amplitude: 2.5 V
Lead Channel Setting Pacing Pulse Width: 0.5 ms
Lead Channel Setting Pacing Pulse Width: 0.5 ms
Lead Channel Setting Sensing Sensitivity: 0.5 mV
Pulse Gen Serial Number: 210000080
Zone Setting Status: 755011

## 2024-09-20 NOTE — Patient Instructions (Signed)
 Medication Instructions:  Your physician recommends that you continue on your current medications as directed. Please refer to the Current Medication list given to you today.  *If you need a refill on your cardiac medications before your next appointment, please call your pharmacy*  Lab Work: CMET, TSH, FreeT4-TODAY If you have labs (blood work) drawn today and your tests are completely normal, you will receive your results only by: MyChart Message (if you have MyChart) OR A paper copy in the mail If you have any lab test that is abnormal or we need to change your treatment, we will call you to review the results.  Follow-Up: At [facility], you and your health needs are our priority.  As part of our continuing mission to provide you with exceptional heart care, our providers are all part of one team.  This team includes your primary Cardiologist (physician) and Advanced Practice Providers or APPs (Physician Assistants and Nurse Practitioners) who all work together to provide you with the care you need, when you need it.  Your next appointment:   1 year(s)  Provider:   Ole Holts, MD

## 2024-09-21 ENCOUNTER — Ambulatory Visit: Payer: Self-pay | Admitting: Student

## 2024-09-21 LAB — COMPREHENSIVE METABOLIC PANEL WITH GFR
ALT: 9 IU/L (ref 0–44)
AST: 12 IU/L (ref 0–40)
Albumin: 3.8 g/dL (ref 3.8–4.9)
Alkaline Phosphatase: 141 IU/L — ABNORMAL HIGH (ref 47–123)
BUN/Creatinine Ratio: 12 (ref 9–20)
BUN: 15 mg/dL (ref 6–24)
Bilirubin Total: 0.7 mg/dL (ref 0.0–1.2)
CO2: 20 mmol/L (ref 20–29)
Calcium: 9 mg/dL (ref 8.7–10.2)
Chloride: 105 mmol/L (ref 96–106)
Creatinine, Ser: 1.25 mg/dL (ref 0.76–1.27)
Globulin, Total: 3.6 g/dL (ref 1.5–4.5)
Glucose: 97 mg/dL (ref 70–99)
Potassium: 4.1 mmol/L (ref 3.5–5.2)
Sodium: 140 mmol/L (ref 134–144)
Total Protein: 7.4 g/dL (ref 6.0–8.5)
eGFR: 68 mL/min/1.73 (ref >59)

## 2024-09-21 LAB — TSH: TSH: 4.82 u[IU]/mL — AB (ref 0.450–4.500)

## 2024-09-21 LAB — T4, FREE: Free T4: 1.26 ng/dL (ref 0.82–1.77)

## 2024-09-23 ENCOUNTER — Ambulatory Visit (INDEPENDENT_AMBULATORY_CARE_PROVIDER_SITE_OTHER)

## 2024-09-23 VITALS — Ht 69.0 in | Wt 349.0 lb

## 2024-09-23 DIAGNOSIS — Z0000 Encounter for general adult medical examination without abnormal findings: Secondary | ICD-10-CM

## 2024-09-23 NOTE — Patient Instructions (Addendum)
 [name],  Thank you for taking the time for your Medicare Wellness Visit. I appreciate your continued commitment to your health goals. Please review the care plan we discussed, and feel free to reach out if I can assist you further.  Medicare recommends these wellness visits once per year to help you and your care team stay ahead of potential health issues. These visits are designed to focus on prevention, allowing your provider to concentrate on managing your acute and chronic conditions during your regular appointments.  Please note that Annual Wellness Visits do not include a physical exam. Some assessments may be limited, especially if the visit was conducted virtually. If needed, we may recommend a separate in-person follow-up with your provider.  Ongoing Care Seeing your primary care provider every 3 to 6 months helps us  monitor your health and provide consistent, personalized care.   Referrals If a referral was made during today's visit and you haven't received any updates within two weeks, please contact the referred provider directly to check on the status.  Recommended Screenings:  Health Maintenance  Topic Date Due   Zoster (Shingles) Vaccine (1 of 2) Never done   Yearly kidney health urinalysis for diabetes  02/08/2015   Hepatitis B Vaccine (2 of 3 - 19+ 3-dose series) 06/22/2018   Hemoglobin A1C  06/14/2024   Complete foot exam   09/21/2024   Colon Cancer Screening  09/23/2025*   Eye exam for diabetics  06/01/2025   Yearly kidney function blood test for diabetes  09/20/2025   Medicare Annual Wellness Visit  09/23/2025   Pneumococcal Vaccine for age over 63  Completed   Hepatitis C Screening  Completed   HIV Screening  Completed   HPV Vaccine  Aged Out   Meningitis B Vaccine  Aged Out   DTaP/Tdap/Td vaccine  Discontinued   Flu Shot  Discontinued   COVID-19 Vaccine  Discontinued  *Topic was postponed. The date shown is not the original due date.       09/23/2024    10:37 AM  Advanced Directives  Does Patient Have a Medical Advance Directive? No  Would patient like information on creating a medical advance directive? No - Patient declined   Advance Care Planning is important because it: Ensures you receive medical care that aligns with your values, goals, and preferences. Provides guidance to your family and loved ones, reducing the emotional burden of decision-making during critical moments.  Vision: Annual vision screenings are recommended for early detection of glaucoma, cataracts, and diabetic retinopathy. These exams can also reveal signs of chronic conditions such as diabetes and high blood pressure.  Dental: Annual dental screenings help detect early signs of oral cancer, gum disease, and other conditions linked to overall health, including heart disease and diabetes.  Please see the attached documents for additional preventive care recommendations.

## 2024-09-23 NOTE — Progress Notes (Signed)
 Subjective:  Please attest and cosign this visit due to patients primary care provider not being in the office at the time the visit was completed.  (Pt of Dr Debby Molt)   [name] is a 58 y.o. who presents for a Medicare Wellness preventive visit.  As a reminder, Annual Wellness Visits don't include a physical exam, and some assessments may be limited, especially if this visit is performed virtually. We may recommend an in-person follow-up visit with your provider if needed.  Visit Complete: Virtual I connected with  [name] on 09/23/24 by a audio enabled telemedicine application and verified that I am speaking with the correct person using two identifiers.  Patient Location: Home  Provider Location: Office/Clinic  I discussed the limitations of evaluation and management by telemedicine. The patient expressed understanding and agreed to proceed.  Vital Signs: Because this visit was a virtual/telehealth visit, some criteria may be missing or patient reported. Any vitals not documented were not able to be obtained and vitals that have been documented are patient reported.  VideoDeclined- This patient declined Librarian, academic. Therefore the visit was completed with audio only.  Persons Participating in Visit: Patient.  AWV Questionnaire: No: Patient Medicare AWV questionnaire was not completed prior to this visit.  Cardiac Risk Factors include: advanced age (>23men, >91 women);diabetes mellitus;dyslipidemia;hypertension;male gender;obesity (BMI >30kg/m2);Other (see comment);smoking/ tobacco exposure, Risk factor comments: A. Fib     Objective:    Today's Vitals   09/23/24 1054  Weight: (!) 349 lb (158.3 kg)  Height: 5' 9 (1.753 m)  PainSc: 10-Worst pain ever  PainLoc: Shoulder   Body mass index is 51.54 kg/m.     09/23/2024   10:37 AM 09/19/2024    1:06 PM 05/21/2024    5:35 AM 10/23/2023   11:59 AM 09/17/2023    9:53 AM  09/15/2023   12:19 PM 12/23/2022   11:54 AM  Advanced Directives  Does Patient Have a Medical Advance Directive? No No Unable to assess, patient is non-responsive or altered mental status No No No No  Would patient like information on creating a medical advance directive? No - Patient declined     No - Patient declined No - Patient declined    Current Medications (verified) Outpatient Encounter Medications as of 09/23/2024  Medication Sig   albuterol  (VENTOLIN  HFA) 108 (90 Base) MCG/ACT inhaler INHALE 1-2 PUFFS INTO THE LUNGS EVERY 4 (FOUR) HOURS AS NEEDED FOR SHORTNESS OF BREATH OR WHEEZING.   amiodarone  (PACERONE ) 200 MG tablet Take 0.5 tablets (100 mg total) by mouth daily. PLEASE SCHEDULE FOLLOW UP FOR MORE RERFILLS   atorvastatin  (LIPITOR) 40 MG tablet Take 1 tablet (40 mg total) by mouth daily.   carvedilol  (COREG ) 6.25 MG tablet Take 1 tablet (6.25 mg total) by mouth 2 (two) times daily. (Patient taking differently: Take 6.25 mg by mouth daily at 6 (six) AM.)   dapagliflozin  propanediol (FARXIGA ) 10 MG TABS tablet Take 1 tablet (10 mg total) by mouth daily.   Doxepin  HCl 6 MG TABS Take 1 tablet (6 mg total) by mouth at bedtime as needed.   gabapentin  (NEURONTIN ) 300 MG capsule Take 300 mg by mouth 3 (three) times daily.   HYDROcodone -acetaminophen  (NORCO/VICODIN) 5-325 MG tablet Take 1 tablet by mouth every 4 (four) hours as needed.   methylPREDNISolone  (MEDROL  DOSEPAK) 4 MG TBPK tablet Day 1: 8mg  before breakfast, 4 mg after lunch, 4 mg after supper, and 8 mg at bedtime Day 2: 4  mg before breakfast, 4 mg after lunch, 4 mg  after supper, and 8 mg  at bedtime Day 3:  4 mg  before breakfast, 4 mg  after lunch, 4 mg after supper, and 4 mg  at bedtime Day 4: 4 mg  before breakfast, 4 mg  after lunch, and 4 mg at bedtime Day 5: 4 mg  before breakfast and 4 mg at bedtime Day 6: 4 mg  before breakfast   pantoprazole  (PROTONIX ) 40 MG tablet TAKE 1 TABLET (40 MG TOTAL) BY MOUTH DAILY. SCHEDULE AN  APPT FOR FURTHER REFILLS   potassium chloride  SA (KLOR-CON  M) 20 MEQ tablet TAKE 2 TABLETS BY MOUTH DAILY   PRESCRIPTION MEDICATION Inhale into the lungs See admin instructions. Bipap, pressure 16/12 with 2L of O2 - use whenever sleeping   sildenafil  (VIAGRA ) 50 MG tablet Take 50 mg by mouth as needed for erectile dysfunction.   spironolactone  (ALDACTONE ) 25 MG tablet TAKE 1 TABLET BY MOUTH EVERYDAY AT BEDTIME   torsemide  (DEMADEX ) 20 MG tablet Take 2 tablets (40 mg total) by mouth every morning   XARELTO  20 MG TABS tablet TAKE 1 TABLET BY MOUTH DAILY WITH SUPPER.   methimazole  (TAPAZOLE ) 5 MG tablet TAKE 1 TABLET (5 MG TOTAL) BY MOUTH DAILY. (Patient not taking: Reported on 09/23/2024)   tirzepatide  (MOUNJARO ) 2.5 MG/0.5ML Pen INJECT 2.5 MG SUBCUTANEOUSLY WEEKLY (Patient not taking: Reported on 09/23/2024)   [DISCONTINUED] pravastatin  (PRAVACHOL ) 40 MG tablet Take 40 mg by mouth daily.   No facility-administered encounter medications on file as of 09/23/2024.    Allergies (verified) Ace inhibitors, Isosorbide , Buspirone, Doxycycline , Bidil [isosorb dinitrate-hydralazine ], Digoxin , Digoxin  and related, and Isosorbide  dinitrate   History: Past Medical History:  Diagnosis Date   Alcohol abuse    Anxiety state, unspecified    Atrial fibrillation (HCC)    CHF (congestive heart failure) (HCC)    Chronic systolic heart failure (HCC)    Diabetes mellitus, type II (HCC)    Edema    Gout    History of medication noncompliance    Migraine    Obesity, unspecified    Obstructive sleep apnea    Psychiatric disorder    Pulmonary embolism (HCC)    Shortness of breath    Past Surgical History:  Procedure Laterality Date   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   Procedure: BIV ICD INSERTION CRT-D;  Surgeon: Cindie Ole DASEN, MD;  Location: [facility] INVASIVE CV LAB;  Service: Cardiovascular;  Laterality: N/A;   CARDIAC CATHETERIZATION     CARDIAC CATHETERIZATION N/A 08/13/2015   Procedure:  Right/Left Heart Cath and Coronary Angiography;  Surgeon: Ezra GORMAN Shuck, MD;  Location: [facility] INVASIVE CV LAB;  Service: Cardiovascular;  Laterality: N/A;   PACEMAKER INSERTION     RIGHT HEART CATH N/A 09/15/2023   Procedure: RIGHT HEART CATH;  Surgeon: Shuck Ezra GORMAN, MD;  Location: [facility] INVASIVE CV LAB;  Service: Cardiovascular;  Laterality: N/A;   RIGHT/LEFT HEART CATH AND CORONARY ANGIOGRAPHY N/A 04/14/2017   Procedure: Right/Left Heart Cath and Coronary Angiography;  Surgeon: Shuck Ezra GORMAN, MD;  Location: [facility] INVASIVE CV LAB;  Service: Cardiovascular;  Laterality: N/A;   TESTICLE SURGERY     Family History  Problem Relation Age of Onset   Cancer Mother        brain tumor   Hypertension Mother    Diabetes Father        Deceased, 54   Heart disease Maternal Grandmother    Hypertension Other  Family History   Stroke Other        Family History   Diabetes Other        Family History   Diabetes Daughter    Social History   Socioeconomic History   Marital status: Divorced    Spouse name: Not on file   Number of children: 3   Years of education: Not on file   Highest education level: Not on file  Occupational History   Occupation: DISABLED  Tobacco Use   Smoking status: Some Days    Current packs/day: 0.00    Average packs/day: 0.5 packs/day for 30.0 years (15.0 ttl pk-yrs)    Types: Cigarettes    Start date: 05/15/1990    Last attempt to quit: 05/15/2020    Years since quitting: 4.3   Smokeless tobacco: Never   Tobacco comments:    09/20/2024 Patient states when he smokes he smokes maybe 5 cigarettes     vaping - nicotine -free products  Vaping Use   Vaping status: Never Used  Substance and Sexual Activity   Alcohol use: Not Currently    Alcohol/week: 0.0 standard drinks of alcohol   Drug use: No   Sexual activity: Not Currently  Other Topics Concern   Not on file  Social History Narrative   He smokes about a pack per day and he has been smoking since he was  58 years of age.  He drinks alcohol occasionally, but he denies any illicit drug abuse.  He is presently on disability.    Lives with wife in a 2 story home.  Has 2 children.   Previously worked in Office manager, last worked in 1998.   Highest level of education:  11th grade      Lives with his daughter's mother, for right now.  Recently divorced. (2024).        Social Drivers of Corporate investment banker Strain: Low Risk  (09/23/2024)   Overall Financial Resource Strain (CARDIA)    Difficulty of Paying Living Expenses: Not hard at all  Food Insecurity: No Food Insecurity (09/23/2024)   Hunger Vital Sign    Worried About Running Out of Food in the Last Year: Never true    Ran Out of Food in the Last Year: Never true  Transportation Needs: No Transportation Needs (09/23/2024)   PRAPARE - Administrator, Civil Service (Medical): No    Lack of Transportation (Non-Medical): No  Physical Activity: Inactive (09/23/2024)   Exercise Vital Sign    Days of Exercise per Week: 0 days    Minutes of Exercise per Session: 0 min  Stress: Stress Concern Present (09/23/2024)   [employer] of Occupational Health - Occupational Stress Questionnaire    Feeling of Stress: Very much  Social Connections: Socially Isolated (09/23/2024)   Social Connection and Isolation Panel    Frequency of Communication with Friends and Family: More than three times a week    Frequency of Social Gatherings with Friends and Family: Never    Attends Religious Services: Never    Database administrator or Organizations: No    Attends Engineer, structural: Never    Marital Status: Divorced    Tobacco Counseling Ready to quit: No Counseling given: Yes Tobacco comments: 09/20/2024 Patient states when he smokes he smokes maybe 5 cigarettes  vaping - nicotine -free products    Clinical Intake:  Pre-visit preparation completed: Yes  Pain : 0-10 Pain Score: 10-Worst pain ever Pain Type:  Neuropathic  pain, Acute pain Pain Location: Shoulder Pain Orientation: Left, Right Pain Descriptors / Indicators: Constant Pain Onset: More than a month ago Pain Frequency: Intermittent     BMI - recorded: 51.54 Nutritional Status: BMI > 30  Obese Nutritional Risks: None, Failure to thrive Diabetes: Yes CBG done?: No Did pt. bring in CBG monitor from home?: No  Lab Results  Component Value Date   HGBA1C 5.1 12/16/2023   HGBA1C 5.4 07/30/2023   HGBA1C 5.5 06/26/2023     How often do you need to have someone help you when you read instructions, pamphlets, or other written materials from your doctor or pharmacy?: 1 - Never  Interpreter Needed?: No  Information entered by :: Verdie Saba, CMA   Activities of Daily Living     09/23/2024   10:44 AM  In your present state of health, do you have any difficulty performing the following activities:  Hearing? 0  Vision? 0  Difficulty concentrating or making decisions? 0  Walking or climbing stairs? 0  Dressing or bathing? 0  Doing errands, shopping? 0  Preparing Food and eating ? N  Using the Toilet? N  In the past six months, have you accidently leaked urine? N  Do you have problems with loss of bowel control? N  Managing your Medications? N  Managing your Finances? N  Housekeeping or managing your Housekeeping? N    Patient Care Team: Joshua Debby CROME, MD as PCP - General (Internal Medicine) Shlomo Wilbert SAUNDERS, MD as PCP - Cardiology (Cardiology) Rolan Ezra RAMAN, MD as PCP - Advanced Heart Failure (Cardiology) Cindie Ole DASEN, MD as PCP - Electrophysiology (Cardiology) Alvan Dorn FALCON, MD as Consulting Physician (Cardiology) Szabat, Toribio BROCKS, [facility] (Inactive) (Pharmacist)  I have updated your Care Teams any recent Medical Services you may have received from other providers in the past year.     Assessment:   This is a routine wellness examination for [name].  Hearing/Vision screen Hearing Screening -  Comments:: Denies hearing difficulties   Vision Screening - Comments:: Wears rx glasses   Goals Addressed               This Visit's Progress     Patient Stated (pt-stated)        Patient stated he plans to walk more and watching diet       Depression Screen     09/23/2024   10:51 AM 08/08/2024   10:37 AM 05/26/2024   10:42 AM 03/23/2024    3:19 PM 09/22/2023    8:30 AM 09/17/2023    9:57 AM 06/22/2023    5:18 PM  PHQ 2/9 Scores  PHQ - 2 Score 5 0 0 0 6 1   PHQ- 9 Score 14   9 14 6       Information is confidential and restricted. Go to Review Flowsheets to unlock data.    Fall Risk     09/23/2024   10:46 AM 08/08/2024   10:37 AM 05/26/2024   10:42 AM 03/23/2024    3:18 PM 09/22/2023    8:30 AM  Fall Risk   Falls in the past year? 0 0 0 0 0  Number falls in past yr: 0 0 0 0 0  Injury with Fall? 0 0 0 0 0  Risk for fall due to : No Fall Risks No Fall Risks No Fall Risks No Fall Risks No Fall Risks  Follow up Falls evaluation completed;Falls prevention discussed Falls evaluation completed Falls evaluation completed Falls  evaluation completed Falls evaluation completed    MEDICARE RISK AT HOME:  Medicare Risk at Home Any stairs in or around the home?: Yes If so, are there any without handrails?: No Home free of loose throw rugs in walkways, pet beds, electrical cords, etc?: Yes Adequate lighting in your home to reduce risk of falls?: Yes Life alert?: No Use of a cane, walker or w/c?: Yes (cane) Grab bars in the bathroom?: No Shower chair or bench in shower?: No Elevated toilet seat or a handicapped toilet?: No  TIMED UP AND GO:  Was the test performed?  No  Cognitive Function: Declined/Normal: No cognitive concerns noted by patient or family. Patient alert, oriented, able to answer questions appropriately and recall recent events. No signs of memory loss or confusion.    09/23/2024   10:53 AM  MMSE - Mini Mental State Exam  Not completed: Refused         09/17/2023   10:03 AM 08/26/2022    3:11 PM  6CIT Screen  What Year? 0 points 0 points  What month? 0 points 0 points  What time? 0 points 0 points  Count back from 20 0 points 0 points  Months in reverse 0 points 0 points  Repeat phrase 0 points 8 points  Total Score 0 points 8 points    Immunizations Immunization History  Administered Date(s) Administered   19-influenza Whole 10/20/2011   Hep A / Hep B 05/25/2018   PNEUMOCOCCAL CONJUGATE-20 02/13/2022   Pneumococcal Polysaccharide-23 08/11/2014   Tdap 10/20/2011    Screening Tests Health Maintenance  Topic Date Due   Zoster Vaccines- Shingrix (1 of 2) Never done   Diabetic kidney evaluation - Urine ACR  02/08/2015   Hepatitis B Vaccines 19-59 Average Risk (2 of 3 - 19+ 3-dose series) 06/22/2018   HEMOGLOBIN A1C  06/14/2024   FOOT EXAM  09/21/2024   Colonoscopy  09/23/2025 (Originally 09/26/2013)   OPHTHALMOLOGY EXAM  06/01/2025   Diabetic kidney evaluation - eGFR measurement  09/20/2025   Medicare Annual Wellness (AWV)  09/23/2025   Pneumococcal Vaccine: 50+ Years  Completed   Hepatitis C Screening  Completed   HIV Screening  Completed   HPV VACCINES  Aged Out   Meningococcal B Vaccine  Aged Out   DTaP/Tdap/Td  Discontinued   Influenza Vaccine  Discontinued   COVID-19 Vaccine  Discontinued    Health Maintenance Items Addressed:  Patient declined Colonoscopy at this time due to managing other medical concerns.  Additional Screening:  Vision Screening: Recommended annual ophthalmology exams for early detection of glaucoma and other disorders of the eye. Is the patient up to date with their annual eye exam?  Yes  Who is the provider or what is the name of the office in which the patient attends annual eye exams? Pt unable to recall the name of Optometrist  Dental Screening: Recommended annual dental exams for proper oral hygiene  Community Resource Referral / Chronic Care Management: CRR required this visit?   No   CCM required this visit?  No   Plan:    I have personally reviewed and noted the following in the patient's chart:   Medical and social history Use of alcohol, tobacco or illicit drugs  Current medications and supplements including opioid prescriptions. Patient is not currently taking opioid prescriptions. Functional ability and status Nutritional status Physical activity Advanced directives List of other physicians Hospitalizations, surgeries, and ER visits in previous 12 months Vitals Screenings to include cognitive, depression, and  falls Referrals and appointments  In addition, I have reviewed and discussed with patient certain preventive protocols, quality metrics, and best practice recommendations. A written personalized care plan for preventive services as well as general preventive health recommendations were provided to patient.   Verdie CHRISTELLA Saba, CMA   09/23/2024   After Visit Summary: (MyChart) Due to this being a telephonic visit, the after visit summary with patients personalized plan was offered to patient via MyChart   Notes: Scheduled a f/u appt w/PCP for 10/2024.  Pt stated he does not have a f/u appt scheduled w/Neurologist.

## 2024-09-28 ENCOUNTER — Other Ambulatory Visit: Payer: Self-pay | Admitting: Internal Medicine

## 2024-09-28 DIAGNOSIS — Z86711 Personal history of pulmonary embolism: Secondary | ICD-10-CM

## 2024-09-28 DIAGNOSIS — I480 Paroxysmal atrial fibrillation: Secondary | ICD-10-CM

## 2024-09-28 NOTE — Progress Notes (Deleted)
 Subjective:    Patient ID: [name], male    DOB: 11-13-1966, 58 y.o.   MRN: [id number]     HPI [name] is here for follow up of his chronic medical problems.  Last saw Dr Joshua 09/2023 - has appt 10/18/24  Recent TSH elevated - follows with Dr Dartha Joshua - pantoprazole , Xarelto     Medications and allergies reviewed with patient and updated if appropriate.  Current Outpatient Medications on File Prior to Visit  Medication Sig Dispense Refill   albuterol  (VENTOLIN  HFA) 108 (90 Base) MCG/ACT inhaler INHALE 1-2 PUFFS INTO THE LUNGS EVERY 4 (FOUR) HOURS AS NEEDED FOR SHORTNESS OF BREATH OR WHEEZING. 8.5 each 1   amiodarone  (PACERONE ) 200 MG tablet Take 0.5 tablets (100 mg total) by mouth daily. PLEASE SCHEDULE FOLLOW UP FOR MORE RERFILLS 45 tablet 2   atorvastatin  (LIPITOR) 40 MG tablet Take 1 tablet (40 mg total) by mouth daily. 90 tablet 3   carvedilol  (COREG ) 6.25 MG tablet Take 1 tablet (6.25 mg total) by mouth 2 (two) times daily. (Patient taking differently: Take 6.25 mg by mouth daily at 6 (six) AM.) 60 tablet 11   dapagliflozin  propanediol (FARXIGA ) 10 MG TABS tablet Take 1 tablet (10 mg total) by mouth daily. 90 tablet 1   Doxepin  HCl 6 MG TABS Take 1 tablet (6 mg total) by mouth at bedtime as needed. 30 tablet 1   gabapentin  (NEURONTIN ) 300 MG capsule Take 300 mg by mouth 3 (three) times daily.     HYDROcodone -acetaminophen  (NORCO/VICODIN) 5-325 MG tablet Take 1 tablet by mouth every 4 (four) hours as needed. 8 tablet 0   methimazole  (TAPAZOLE ) 5 MG tablet TAKE 1 TABLET (5 MG TOTAL) BY MOUTH DAILY. (Patient not taking: Reported on 09/23/2024) 90 tablet 1   methylPREDNISolone  (MEDROL  DOSEPAK) 4 MG TBPK tablet Day 1: 8mg  before breakfast, 4 mg after lunch, 4 mg after supper, and 8 mg at bedtime Day 2: 4 mg before breakfast, 4 mg after lunch, 4 mg  after supper, and 8 mg  at bedtime Day 3:  4 mg  before breakfast, 4 mg  after lunch, 4 mg after supper, and 4 mg  at  bedtime Day 4: 4 mg  before breakfast, 4 mg  after lunch, and 4 mg at bedtime Day 5: 4 mg  before breakfast and 4 mg at bedtime Day 6: 4 mg  before breakfast 1 each 0   pantoprazole  (PROTONIX ) 40 MG tablet TAKE 1 TABLET (40 MG TOTAL) BY MOUTH DAILY. SCHEDULE AN APPT FOR FURTHER REFILLS 90 tablet 1   potassium chloride  SA (KLOR-CON  M) 20 MEQ tablet TAKE 2 TABLETS BY MOUTH DAILY 180 tablet 3   PRESCRIPTION MEDICATION Inhale into the lungs See admin instructions. Bipap, pressure 16/12 with 2L of O2 - use whenever sleeping     sildenafil  (VIAGRA ) 50 MG tablet Take 50 mg by mouth as needed for erectile dysfunction.     spironolactone  (ALDACTONE ) 25 MG tablet TAKE 1 TABLET BY MOUTH EVERYDAY AT BEDTIME 90 tablet 3   tirzepatide  (MOUNJARO ) 2.5 MG/0.5ML Pen INJECT 2.5 MG SUBCUTANEOUSLY WEEKLY (Patient not taking: Reported on 09/23/2024) 2 mL 0   torsemide  (DEMADEX ) 20 MG tablet Take 2 tablets (40 mg total) by mouth every morning 60 tablet 6   XARELTO  20 MG TABS tablet TAKE 1 TABLET BY MOUTH DAILY WITH SUPPER. 90 tablet 0   [DISCONTINUED] pravastatin  (PRAVACHOL ) 40 MG tablet Take 40 mg by mouth daily.  No current facility-administered medications on file prior to visit.     Review of Systems     Objective:  There were no vitals filed for this visit. BP Readings from Last 3 Encounters:  09/20/24 102/68  09/19/24 103/72  08/08/24 118/70   Wt Readings from Last 3 Encounters:  09/23/24 (!) 349 lb (158.3 kg)  09/20/24 (!) 349 lb 1.6 oz (158.4 kg)  09/19/24 (!) 340 lb (154.2 kg)   There is no height or weight on file to calculate BMI.    Physical Exam     Lab Results  Component Value Date   WBC 6.7 09/19/2024   HGB 14.3 09/19/2024   HCT 41.7 09/19/2024   PLT 282 09/19/2024   GLUCOSE 97 09/20/2024   CHOL 140 12/16/2023   TRIG 123 12/16/2023   HDL 35 (L) 12/16/2023   LDLDIRECT 114.0 03/21/2020   LDLCALC 83 12/16/2023   ALT 9 09/20/2024   AST 12 09/20/2024   NA 140 09/20/2024   K  4.1 09/20/2024   CL 105 09/20/2024   CREATININE 1.25 09/20/2024   BUN 15 09/20/2024   CO2 20 09/20/2024   TSH 4.820 (H) 09/20/2024   PSA 0.51 02/05/2023   INR 1.5 (H) 09/29/2023   HGBA1C 5.1 12/16/2023   MICROALBUR 3.88 (H) 02/07/2014     Assessment & Plan:    See Problem List for Assessment and Plan of chronic medical problems.

## 2024-09-29 ENCOUNTER — Ambulatory Visit: Admitting: Internal Medicine

## 2024-09-29 ENCOUNTER — Other Ambulatory Visit (HOSPITAL_COMMUNITY): Payer: Self-pay | Admitting: Family Medicine

## 2024-09-29 ENCOUNTER — Encounter: Payer: Self-pay | Admitting: Internal Medicine

## 2024-09-29 DIAGNOSIS — E785 Hyperlipidemia, unspecified: Secondary | ICD-10-CM

## 2024-10-03 ENCOUNTER — Other Ambulatory Visit

## 2024-10-03 ENCOUNTER — Ambulatory Visit: Attending: Cardiology

## 2024-10-03 DIAGNOSIS — I5022 Chronic systolic (congestive) heart failure: Secondary | ICD-10-CM

## 2024-10-03 DIAGNOSIS — E0590 Thyrotoxicosis, unspecified without thyrotoxic crisis or storm: Secondary | ICD-10-CM | POA: Diagnosis not present

## 2024-10-03 DIAGNOSIS — Z95810 Presence of automatic (implantable) cardiac defibrillator: Secondary | ICD-10-CM

## 2024-10-04 LAB — T3, FREE: T3, Free: 2.6 pg/mL (ref 2.3–4.2)

## 2024-10-04 LAB — TSH: TSH: 2.67 m[IU]/L (ref 0.40–4.50)

## 2024-10-04 LAB — T4, FREE: Free T4: 1.2 ng/dL (ref 0.8–1.8)

## 2024-10-05 ENCOUNTER — Telehealth (HOSPITAL_COMMUNITY): Payer: Self-pay | Admitting: Cardiology

## 2024-10-05 DIAGNOSIS — Z86711 Personal history of pulmonary embolism: Secondary | ICD-10-CM

## 2024-10-05 DIAGNOSIS — E118 Type 2 diabetes mellitus with unspecified complications: Secondary | ICD-10-CM

## 2024-10-05 DIAGNOSIS — F419 Anxiety disorder, unspecified: Secondary | ICD-10-CM

## 2024-10-05 MED ORDER — DAPAGLIFLOZIN PROPANEDIOL 10 MG PO TABS: 10.0000 mg | ORAL_TABLET | Freq: Every day | ORAL | 1 refills | Status: DC

## 2024-10-05 NOTE — Telephone Encounter (Signed)
 Refill sent.

## 2024-10-07 ENCOUNTER — Encounter: Payer: Self-pay | Admitting: "Endocrinology

## 2024-10-07 ENCOUNTER — Telehealth: Payer: Self-pay

## 2024-10-07 ENCOUNTER — Ambulatory Visit (INDEPENDENT_AMBULATORY_CARE_PROVIDER_SITE_OTHER): Admitting: "Endocrinology

## 2024-10-07 ENCOUNTER — Other Ambulatory Visit

## 2024-10-07 VITALS — BP 102/80 | HR 74 | Ht 69.0 in | Wt 345.0 lb

## 2024-10-07 DIAGNOSIS — E119 Type 2 diabetes mellitus without complications: Secondary | ICD-10-CM

## 2024-10-07 DIAGNOSIS — E010 Iodine-deficiency related diffuse (endemic) goiter: Secondary | ICD-10-CM | POA: Diagnosis not present

## 2024-10-07 DIAGNOSIS — E038 Other specified hypothyroidism: Secondary | ICD-10-CM

## 2024-10-07 NOTE — Progress Notes (Signed)
 EPIC Encounter for ICM Monitoring  Patient Name: [name] is a 58 y.o. male Date: 10/07/2024 Primary Care Physican: Joshua Debby LITTIE, MD Primary Cardiologist: Rolan Electrophysiologist: Kennyth Pore Pacing: 96% 02/04/2023 Weight:  358 lbs       06/01/2023 Weight: 328 lbs (losing weight taking Mounjaro )   06/09/2023 Weight: 328 lbs             07/07/2023 Weight: 308 lbs   09/16/2023 Weight: 317 lbs    12/29/2023 Office Weight: 311 lbs 02/22/2024 Office Weight: 330 lbs 03/30/2024 Weight: 332 lbs (was up to 336 lbs) 04/01/2024 Weight:  331 lbs 07/04/2024 Office Weight: 344 lbs 09/20/2024 Office Weight: 349 lbs   Attempted call to patient and unable to reach.   Transmission results reviewed.    Diet:  Drinks a lot of fluids.     Since 08/29/2024 ICM Remote Transmission:  CorVue thoracic impedance suggesting normal fluid levels with the exception of possible fluid accumulation from 09/09/2024-09/13/2024 and 09/16/2024-09/22/2024. [name]   Prescribed:  Torsemide  20 mg Take 2 tablet(s) (40 mg total) every morning. Potassium 20 mEq take 2 tablet(s) (40 mEq total) by mouth daily.   Spironolactone  25 mg take 1 tablet daily   Labs: 09/20/2024 Creatinine 1.25, BUN 15, Potassium 4.1, Sodium 140, GFR 68 09/19/2024 Creatinine 1.17, BUN 12, Potassium 3.6, Sodium 136 06/30/2024 Creatinine 1.45, BUN 18, Potassium 4.4, Sodium 138, GFR 57 05/21/2024 Creatinine 1.33, BUN 19, Potassium 4.1, Sodium 135, GFR >60 02/22/2024 Creatinine 1.77, BUN 27, Potassium 3.7, Sodium 136, GFR 45 A complete set of results can be found in Results Review.   Recommendations:   Unable to reach.     Follow-up plan: ICM clinic phone appointment 11/14/2024.   91 day device clinic remote transmission 10/25/2024.     EP/Cardiology Office Visits:  Recall 09/28/2024 with HF Clinic.   Recall 09/15/2025 with Charlies Arthur, PA   Copy of ICM check sent to Dr. Kennyth.  Remote monitoring is medically necessary for Heart Failure Management.     Daily Thoracic Impedance ICM trend: 07/05/2024 through 10/03/2024.    12-14 Month Thoracic Impedance ICM trend:     [name], RN 10/07/2024 9:12 AM

## 2024-10-07 NOTE — Progress Notes (Signed)
 Spoke with patient and heart failure questions reviewed.  Transmission results reviewed.  Pt asymptomatic for fluid accumulation and did not have any symptoms during decreased impedance.  Reports feeling well at this time and voices no complaints.  10/07/2024 Office weight: 345 lbs.  No changes and encouraged to call if experiencing any fluid symptoms.

## 2024-10-07 NOTE — Telephone Encounter (Signed)
 Remote ICM transmission received.  Attempted call to patient regarding ICM remote transmission and no answer.

## 2024-10-07 NOTE — Progress Notes (Signed)
 Outpatient Endocrinology Note Adam Birmingham, [name]  10/07/24   [name] 07/29/1966 [id number]  Referring Provider: Joshua Debby LITTIE, [name] Primary Care Provider: Joshua Debby LITTIE, [name] Subjective  No chief complaint on file.   Assessment & Plan  Diagnoses and all orders for this visit:  Subclinical hypothyroidism -     TSH + free T4  Thyromegaly -     US  THYROID ; Future -     US  THYROID   Controlled type 2 diabetes mellitus without complication (HCC) -     Microalbumin / creatinine urine ratio -     Ambulatory referral to Podiatry     [name] is currently not taking any methimazole . Self stopped methimzole in 07/2024 (was asked to take 2.5mg marica day) TSI/TRAb -ve He is on amiodarone  100 mg every day for Atrial fibrillation, on amiodarone  since 2022. 12/22/23: TSI/TRAb -ve 04/07/24: THYROID  SCAN AND UPTAKE : Decreased iodine  at take values at 4 hours and 24 hours. This may be artifactual given patient's chronic amiodarone  therapy Educated on thyroid  axis.  Recommend the following: ordered lab in 6 weeks. Repeat labs now and then in 3 months or sooner if symptoms of hyper or hypothyroidism develop.   Right thyromegaly noted on exam Ordered ultrasound  Patient has very dry feet as well as discolored toenails, onychomycosis has history of diabetes managed by PCP Referred to podiatry  Follows up with ophthalmologist  I have reviewed current medications, nurse's notes, allergies, vital signs, past medical and surgical history, family medical history, and social history for this encounter. Counseled patient on symptoms, examination findings, lab findings, imaging results, treatment decisions and monitoring and prognosis. The patient understood the recommendations and agrees with the treatment plan. All questions regarding treatment plan were fully answered.   Return in about 6 weeks (around 11/18/2024) for visit, labs today.   Adam Birmingham, [name]  10/07/24   I  have reviewed current medications, nurse's notes, allergies, vital signs, past medical and surgical history, family medical history, and social history for this encounter. Counseled patient on symptoms, examination findings, lab findings, imaging results, treatment decisions and monitoring and prognosis. The patient understood the recommendations and agrees with the treatment plan. All questions regarding treatment plan were fully answered.   History of Present Illness [name] is a 58 y.o. year old male who presents to our clinic with hyperthyroidism diagnosed in 07/2023.    On amiodarone   Symptoms suggestive of HYPOTHYROIDISM:  fatigue Yes weight gain No cold intolerance  No constipation  No  Symptoms suggestive of HYPERTHYROIDISM:  weight loss  Yes, just restarted mounjaro  and lost 2 lbs, gained before mounjaro  heat intolerance No hyperdefecation  No palpitations  No has pace maker   Compressive symptoms:  dysphagia  No dysphonia  No positional dyspnea (especially with simultaneous arms elevation)  No  Smokes  Yes On biotin  No Personal history of head/neck surgery/irradiation  No    Component     Latest Ref Rng 07/30/2023 08/28/2023  TSH     0.350 - 4.500 uIU/mL <0.010 (L)    Triiodothyronine,Free,Serum     2.0 - 4.4 pg/mL  3.0   T4,Free(Direct)     0.61 - 1.12 ng/dL  8.45 (H)    88/07/7974 CT CHEST, ABDOMEN AND PELVIS WITHOUT CONTRAST  Thyroid  gland demonstrate no significant findings.  Physical Exam  BP 102/80   Pulse 74   Ht 5' 9 (1.753 m)   Wt (!) 345 lb (156.5 kg)  SpO2 98%   BMI 50.95 kg/m  Constitutional: well developed, well nourished Head: normocephalic, atraumatic, no exophthalmos Eyes: sclera anicteric, no redness Neck: + R thyromegaly, no thyroid  tenderness; no nodules palpated Lungs: normal respiratory effort Neurology: alert and oriented, no fine hand tremor Skin: dry, no appreciable rashes Musculoskeletal: no appreciable  defects Psychiatric: normal mood and affect Diabetic Foot Exam - Simple   Simple Foot Form Diabetic Foot exam was performed with the following findings: Yes 10/07/2024 10:37 AM  Visual Inspection No deformities, no ulcerations, no other skin breakdown bilaterally: Yes Sensation Testing Intact to touch and monofilament testing bilaterally: Yes Pulse Check Posterior Tibialis and Dorsalis pulse intact bilaterally: Yes Comments     Allergies Allergies  Allergen Reactions   Ace Inhibitors Anaphylaxis and Swelling    Angioedema   Isosorbide  Anaphylaxis   Buspirone Other (See Comments)    dizziness   Doxycycline  Other (See Comments)   Bidil [Isosorb Dinitrate-Hydralazine ] Other (See Comments)    headache   Digoxin      Unspecified side effects   Digoxin  And Related     Unspecified side effects   Isosorbide  Dinitrate     Current Medications Patient's Medications  New Prescriptions   No medications on file  Previous Medications   ALBUTEROL  (VENTOLIN  HFA) 108 (90 BASE) MCG/ACT INHALER    INHALE 1-2 PUFFS INTO THE LUNGS EVERY 4 (FOUR) HOURS AS NEEDED FOR SHORTNESS OF BREATH OR WHEEZING.   AMIODARONE  (PACERONE ) 200 MG TABLET    Take 0.5 tablets (100 mg total) by mouth daily. PLEASE SCHEDULE FOLLOW UP FOR MORE RERFILLS   ATORVASTATIN  (LIPITOR) 40 MG TABLET    TAKE 1 TABLET BY MOUTH EVERY DAY   CARVEDILOL  (COREG ) 6.25 MG TABLET    Take 1 tablet (6.25 mg total) by mouth 2 (two) times daily.   DAPAGLIFLOZIN  PROPANEDIOL (FARXIGA ) 10 MG TABS TABLET    Take 1 tablet (10 mg total) by mouth daily.   DOXEPIN  HCL 6 MG TABS    Take 1 tablet (6 mg total) by mouth at bedtime as needed.   GABAPENTIN  (NEURONTIN ) 300 MG CAPSULE    Take 300 mg by mouth 3 (three) times daily.   METHIMAZOLE  (TAPAZOLE ) 5 MG TABLET    TAKE 1 TABLET (5 MG TOTAL) BY MOUTH DAILY.   METHYLPREDNISOLONE  (MEDROL  DOSEPAK) 4 MG TBPK TABLET    Day 1: 8mg  before breakfast, 4 mg after lunch, 4 mg after supper, and 8 mg at bedtime  Day 2: 4 mg before breakfast, 4 mg after lunch, 4 mg  after supper, and 8 mg  at bedtime Day 3:  4 mg  before breakfast, 4 mg  after lunch, 4 mg after supper, and 4 mg  at bedtime Day 4: 4 mg  before breakfast, 4 mg  after lunch, and 4 mg at bedtime Day 5: 4 mg  before breakfast and 4 mg at bedtime Day 6: 4 mg  before breakfast   PANTOPRAZOLE  (PROTONIX ) 40 MG TABLET    TAKE 1 TABLET (40 MG TOTAL) BY MOUTH DAILY. SCHEDULE AN APPT FOR FURTHER REFILLS   POTASSIUM CHLORIDE  SA (KLOR-CON  M) 20 MEQ TABLET    TAKE 2 TABLETS BY MOUTH DAILY   PRESCRIPTION MEDICATION    Inhale into the lungs See admin instructions. Bipap, pressure 16/12 with 2L of O2 - use whenever sleeping   SILDENAFIL  (VIAGRA ) 50 MG TABLET    Take 50 mg by mouth as needed for erectile dysfunction.   SPIRONOLACTONE  (ALDACTONE ) 25 MG TABLET  TAKE 1 TABLET BY MOUTH EVERYDAY AT BEDTIME   TIRZEPATIDE  (MOUNJARO ) 2.5 MG/0.5ML PEN    INJECT 2.5 MG SUBCUTANEOUSLY WEEKLY   TORSEMIDE  (DEMADEX ) 20 MG TABLET    Take 2 tablets (40 mg total) by mouth every morning   XARELTO  20 MG TABS TABLET    TAKE 1 TABLET BY MOUTH DAILY WITH SUPPER.  Modified Medications   No medications on file  Discontinued Medications   No medications on file    Past Medical History Past Medical History:  Diagnosis Date   Alcohol abuse    Anxiety state, unspecified    Atrial fibrillation (HCC)    CHF (congestive heart failure) (HCC)    Chronic systolic heart failure (HCC)    Diabetes mellitus, type II (HCC)    Edema    Gout    History of medication noncompliance    Migraine    Obesity, unspecified    Obstructive sleep apnea    Psychiatric disorder    Pulmonary embolism (HCC)    Shortness of breath     Past Surgical History Past Surgical History:  Procedure Laterality Date   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   Procedure: BIV ICD INSERTION CRT-D;  Surgeon: Cindie Ole DASEN, [name];  Location: [facility] INVASIVE CV LAB;  Service: Cardiovascular;  Laterality: N/A;    CARDIAC CATHETERIZATION     CARDIAC CATHETERIZATION N/A 08/13/2015   Procedure: Right/Left Heart Cath and Coronary Angiography;  Surgeon: Ezra GORMAN Shuck, [name];  Location: [facility] INVASIVE CV LAB;  Service: Cardiovascular;  Laterality: N/A;   PACEMAKER INSERTION     RIGHT HEART CATH N/A 09/15/2023   Procedure: RIGHT HEART CATH;  Surgeon: Shuck Ezra GORMAN, [name];  Location: [facility] INVASIVE CV LAB;  Service: Cardiovascular;  Laterality: N/A;   RIGHT/LEFT HEART CATH AND CORONARY ANGIOGRAPHY N/A 04/14/2017   Procedure: Right/Left Heart Cath and Coronary Angiography;  Surgeon: Shuck Ezra GORMAN, [name];  Location: [facility] INVASIVE CV LAB;  Service: Cardiovascular;  Laterality: N/A;   TESTICLE SURGERY      Family History family history includes Cancer in his mother; Diabetes in his daughter, father, and another family member; Heart disease in his maternal grandmother; Hypertension in his mother and another family member; Stroke in an other family member.  Social History Social History   Socioeconomic History   Marital status: Divorced    Spouse name: Not on file   Number of children: 3   Years of education: Not on file   Highest education level: Not on file  Occupational History   Occupation: DISABLED  Tobacco Use   Smoking status: Some Days    Current packs/day: 0.00    Average packs/day: 0.5 packs/day for 30.0 years (15.0 ttl pk-yrs)    Types: Cigarettes    Start date: 05/15/1990    Last attempt to quit: 05/15/2020    Years since quitting: 4.4   Smokeless tobacco: Never   Tobacco comments:    09/20/2024 Patient states when he smokes he smokes maybe 5 cigarettes     vaping - nicotine -free products  Vaping Use   Vaping status: Never Used  Substance and Sexual Activity   Alcohol use: Not Currently    Alcohol/week: 0.0 standard drinks of alcohol   Drug use: No   Sexual activity: Not Currently  Other Topics Concern   Not on file  Social History Narrative   He smokes about a pack per day and he has been  smoking since he was 58 years of age.  He drinks  alcohol occasionally, but he denies any illicit drug abuse.  He is presently on disability.    Lives with wife in a 2 story home.  Has 2 children.   Previously worked in office manager, last worked in 1998.   Highest level of education:  11th grade      Lives with his daughter's mother, for right now.  Recently divorced. (2024).        Social Drivers of Corporate Investment Banker Strain: Low Risk  (09/23/2024)   Overall Financial Resource Strain (CARDIA)    Difficulty of Paying Living Expenses: Not hard at all  Food Insecurity: No Food Insecurity (09/23/2024)   Hunger Vital Sign    Worried About Running Out of Food in the Last Year: Never true    Ran Out of Food in the Last Year: Never true  Transportation Needs: No Transportation Needs (09/23/2024)   PRAPARE - Administrator, Civil Service (Medical): No    Lack of Transportation (Non-Medical): No  Physical Activity: Inactive (09/23/2024)   Exercise Vital Sign    Days of Exercise per Week: 0 days    Minutes of Exercise per Session: 0 min  Stress: Stress Concern Present (09/23/2024)   [employer] of Occupational Health - Occupational Stress Questionnaire    Feeling of Stress: Very much  Social Connections: Socially Isolated (09/23/2024)   Social Connection and Isolation Panel    Frequency of Communication with Friends and Family: More than three times a week    Frequency of Social Gatherings with Friends and Family: Never    Attends Religious Services: Never    Database Administrator or Organizations: No    Attends Banker Meetings: Never    Marital Status: Divorced  Catering Manager Violence: Not At Risk (09/23/2024)   Humiliation, Afraid, Rape, and Kick questionnaire    Fear of Current or Ex-Partner: No    Emotionally Abused: No    Physically Abused: No    Sexually Abused: No    Laboratory Investigations Lab Results  Component Value Date   TSH  2.67 10/03/2024   TSH 4.820 (H) 09/20/2024   TSH 6.705 (H) 06/30/2024   FREET4 1.2 10/03/2024   FREET4 1.26 09/20/2024   FREET4 1.10 06/30/2024     Lab Results  Component Value Date   TSI 102 12/22/2023     No components found for: TRAB   Lab Results  Component Value Date   CHOL 140 12/16/2023   Lab Results  Component Value Date   HDL 35 (L) 12/16/2023   Lab Results  Component Value Date   LDLCALC 83 12/16/2023   Lab Results  Component Value Date   TRIG 123 12/16/2023   Lab Results  Component Value Date   CHOLHDL 4.0 12/16/2023   Lab Results  Component Value Date   CREATININE 1.25 09/20/2024   Lab Results  Component Value Date   GFR 40.56 (L) 02/06/2023      Component Value Date/Time   NA 140 09/20/2024 1011   K 4.1 09/20/2024 1011   CL 105 09/20/2024 1011   CO2 20 09/20/2024 1011   GLUCOSE 97 09/20/2024 1011   GLUCOSE 105 (H) 09/19/2024 1325   BUN 15 09/20/2024 1011   CREATININE 1.25 09/20/2024 1011   CREATININE 1.61 (H) 12/16/2023 1012   CALCIUM  9.0 09/20/2024 1011   PROT 7.4 09/20/2024 1011   ALBUMIN  3.8 09/20/2024 1011   AST 12 09/20/2024 1011   ALT 9 09/20/2024 1011  ALKPHOS 141 (H) 09/20/2024 1011   BILITOT 0.7 09/20/2024 1011   GFRNONAA >60 09/19/2024 1325   GFRAA >60 05/15/2020 1452      Latest Ref Rng & Units 09/20/2024   10:11 AM 09/19/2024    1:25 PM 06/30/2024   11:34 AM  BMP  Glucose 70 - 99 mg/dL 97  894  98   BUN 6 - 24 mg/dL 15  12  18    Creatinine 0.76 - 1.27 mg/dL 8.74  8.82  8.54   BUN/Creat Ratio 9 - 20 12     Sodium 134 - 144 mmol/L 140  136  138   Potassium 3.5 - 5.2 mmol/L 4.1  3.6  4.4   Chloride 96 - 106 mmol/L 105  107  105   CO2 20 - 29 mmol/L 20  19  24    Calcium  8.7 - 10.2 mg/dL 9.0  8.7  8.9        Component Value Date/Time   WBC 6.7 09/19/2024 1325   RBC 5.23 09/19/2024 1325   HGB 14.3 09/19/2024 1325   HCT 41.7 09/19/2024 1325   PLT 282 09/19/2024 1325   MCV 79.7 (L) 09/19/2024 1325   MCH 27.3  09/19/2024 1325   MCHC 34.3 09/19/2024 1325   RDW 15.0 09/19/2024 1325   LYMPHSABS 3.3 09/29/2023 0938   MONOABS 0.6 09/29/2023 0938   EOSABS 0.9 (H) 09/29/2023 0938   BASOSABS 0.1 09/29/2023 0938      Parts of this note may have been dictated using voice recognition software. There may be variances in spelling and vocabulary which are unintentional. Not all errors are proofread. Please notify the dino if any discrepancies are noted or if the meaning of any statement is not clear.

## 2024-10-08 LAB — MICROALBUMIN / CREATININE URINE RATIO
Creatinine, Urine: 118 mg/dL (ref 20–320)
Microalb Creat Ratio: 4 mg/g{creat} (ref <30)
Microalb, Ur: 0.5 mg/dL

## 2024-10-13 ENCOUNTER — Ambulatory Visit: Admitting: Podiatry

## 2024-10-13 ENCOUNTER — Encounter: Payer: Self-pay | Admitting: Podiatry

## 2024-10-13 DIAGNOSIS — M79675 Pain in left toe(s): Secondary | ICD-10-CM

## 2024-10-13 DIAGNOSIS — B351 Tinea unguium: Secondary | ICD-10-CM | POA: Diagnosis not present

## 2024-10-13 DIAGNOSIS — M79674 Pain in right toe(s): Secondary | ICD-10-CM

## 2024-10-14 ENCOUNTER — Ambulatory Visit (HOSPITAL_COMMUNITY)
Admission: RE | Admit: 2024-10-14 | Discharge: 2024-10-14 | Disposition: A | Discharge status: Home / Self Care | Source: Ambulatory Visit | Attending: "Endocrinology | Admitting: "Endocrinology

## 2024-10-14 DIAGNOSIS — E010 Iodine-deficiency related diffuse (endemic) goiter: Secondary | ICD-10-CM | POA: Insufficient documentation

## 2024-10-14 DIAGNOSIS — E0510 Thyrotoxicosis with toxic single thyroid nodule without thyrotoxic crisis or storm: Secondary | ICD-10-CM | POA: Diagnosis not present

## 2024-10-15 NOTE — Progress Notes (Signed)
 Subjective:   Patient ID: [name], male   DOB: 58 y.o.   MRN: [id number]   HPI Patient presents severe nail disease 1-5 both feet that does not been cut for a long time and painful neuro   ROS      Objective:  Physical Exam  Vascular status intact with severe thickening and pain of the nailbeds 1-5 both feet     Assessment:  Mycotic nail infection 1-5 both feet     Plan:  Debridement nailbeds 1-5 both feet Neutra genic bleeding reappoint 2 see back

## 2024-10-18 ENCOUNTER — Ambulatory Visit: Admitting: Internal Medicine

## 2024-10-18 ENCOUNTER — Encounter: Payer: Self-pay | Admitting: Internal Medicine

## 2024-10-18 VITALS — BP 110/82 | HR 75 | Temp 98.3°F | Resp 16 | Ht 69.0 in | Wt 345.2 lb

## 2024-10-18 DIAGNOSIS — Z7985 Long-term (current) use of injectable non-insulin antidiabetic drugs: Secondary | ICD-10-CM

## 2024-10-18 DIAGNOSIS — G4733 Obstructive sleep apnea (adult) (pediatric): Secondary | ICD-10-CM

## 2024-10-18 DIAGNOSIS — M150 Primary generalized (osteo)arthritis: Secondary | ICD-10-CM

## 2024-10-18 DIAGNOSIS — Z6841 Body Mass Index (BMI) 40.0 and over, adult: Secondary | ICD-10-CM

## 2024-10-18 DIAGNOSIS — Z86711 Personal history of pulmonary embolism: Secondary | ICD-10-CM

## 2024-10-18 DIAGNOSIS — E781 Pure hyperglyceridemia: Secondary | ICD-10-CM | POA: Diagnosis not present

## 2024-10-18 DIAGNOSIS — R252 Cramp and spasm: Secondary | ICD-10-CM | POA: Diagnosis not present

## 2024-10-18 DIAGNOSIS — Z125 Encounter for screening for malignant neoplasm of prostate: Secondary | ICD-10-CM

## 2024-10-18 DIAGNOSIS — I480 Paroxysmal atrial fibrillation: Secondary | ICD-10-CM

## 2024-10-18 DIAGNOSIS — E118 Type 2 diabetes mellitus with unspecified complications: Secondary | ICD-10-CM

## 2024-10-18 DIAGNOSIS — Z0001 Encounter for general adult medical examination with abnormal findings: Secondary | ICD-10-CM | POA: Diagnosis not present

## 2024-10-18 DIAGNOSIS — Z1211 Encounter for screening for malignant neoplasm of colon: Secondary | ICD-10-CM

## 2024-10-18 LAB — PSA: PSA: 0.71 ng/mL (ref 0.10–4.00)

## 2024-10-18 LAB — LIPID PANEL
Cholesterol: 143 mg/dL (ref 0–200)
HDL: 35.5 mg/dL — ABNORMAL LOW (ref >39.00)
LDL Cholesterol: 86 mg/dL (ref 0–99)
NonHDL: 107.75
Total CHOL/HDL Ratio: 4
Triglycerides: 110 mg/dL (ref 0.0–149.0)
VLDL: 22 mg/dL (ref 0.0–40.0)

## 2024-10-18 LAB — HEMOGLOBIN A1C: Hgb A1c MFr Bld: 6.6 % — ABNORMAL HIGH (ref 4.6–6.5)

## 2024-10-18 LAB — C-REACTIVE PROTEIN: CRP: 1.4 mg/dL (ref 0.5–20.0)

## 2024-10-18 LAB — CK: Total CK: 64 U/L (ref 17–232)

## 2024-10-18 LAB — MAGNESIUM: Magnesium: 2 mg/dL (ref 1.5–2.5)

## 2024-10-18 MED ORDER — MOUNJARO 2.5 MG/0.5ML ~~LOC~~ SOAJ: 2.5000 mg | SUBCUTANEOUS | 0 refills | Status: AC

## 2024-10-18 MED ORDER — XARELTO 20 MG PO TABS: 20.0000 mg | ORAL_TABLET | Freq: Every day | ORAL | 1 refills | Status: AC

## 2024-10-18 MED ORDER — TRAMADOL HCL ER 100 MG PO TB24: 100.0000 mg | ORAL_TABLET | Freq: Every day | ORAL | 1 refills | Status: DC

## 2024-10-18 NOTE — Progress Notes (Unsigned)
 Subjective:  Patient ID: [name], male    DOB: 10/16/1966  Age: 58 y.o. MRN: [id number]  CC: Annual Exam, Congestive Heart Failure, Atrial Fibrillation, and Osteoarthritis   HPI [name] presents for a CPX and f/up --  Discussed the use of AI scribe software for clinical note transcription with the patient, who gave verbal consent to proceed.  History of Present Illness [name] is a 58 year old male with sleep apnea and arthritis who presents with extreme fatigue and joint pain.  He experiences extreme fatigue despite using a BiPAP machine. He wakes up frequently during the night and feels very tired upon waking. He has tried using the BiPAP with and without distilled water  but continues to feel fatigued. He has not seen a sleep doctor in a long time and does not recall the last time he had a consultation regarding his sleep apnea.  He describes significant joint pain, which he refers to as 'arthritis all through my body.' The pain initially started in his hands and has since moved to his shoulders and shoulder blades, occurring in both shoulders simultaneously. Prednisone  has provided relief in the past, but the pain returns once the medication is stopped. Topical creams and Tylenol  have been ineffective. He recently took a leftover 10 mg prednisone  tablet from June, which alleviated the pain immediately.  His throat occasionally starts hurting 'out of nowhere,' but he denies any trouble swallowing.  He is currently taking Mounjaro  and Farxiga  for diabetes management. He recalls his last A1c was low, around 5, but he is unsure of the exact date of the test. No symptoms of high or low blood sugar.  No dizziness, lightheadedness, bleeding, or bruising from Xarelto . No constipation or blood in stool.     Outpatient Medications Prior to Visit  Medication Sig Dispense Refill   albuterol  (VENTOLIN  HFA) 108 (90 Base) MCG/ACT inhaler INHALE 1-2 PUFFS INTO THE LUNGS EVERY  4 (FOUR) HOURS AS NEEDED FOR SHORTNESS OF BREATH OR WHEEZING. 8.5 each 1   amiodarone  (PACERONE ) 200 MG tablet Take 0.5 tablets (100 mg total) by mouth daily. PLEASE SCHEDULE FOLLOW UP FOR MORE RERFILLS 45 tablet 2   atorvastatin  (LIPITOR) 40 MG tablet TAKE 1 TABLET BY MOUTH EVERY DAY 90 tablet 3   carvedilol  (COREG ) 6.25 MG tablet Take 1 tablet (6.25 mg total) by mouth 2 (two) times daily. (Patient taking differently: Take 6.25 mg by mouth daily at 6 (six) AM.) 60 tablet 11   dapagliflozin  propanediol (FARXIGA ) 10 MG TABS tablet Take 1 tablet (10 mg total) by mouth daily. 90 tablet 1   gabapentin  (NEURONTIN ) 300 MG capsule Take 300 mg by mouth 3 (three) times daily.     methimazole  (TAPAZOLE ) 5 MG tablet TAKE 1 TABLET (5 MG TOTAL) BY MOUTH DAILY. 90 tablet 1   pantoprazole  (PROTONIX ) 40 MG tablet TAKE 1 TABLET (40 MG TOTAL) BY MOUTH DAILY. SCHEDULE AN APPT FOR FURTHER REFILLS 90 tablet 1   potassium chloride  SA (KLOR-CON  M) 20 MEQ tablet TAKE 2 TABLETS BY MOUTH DAILY 180 tablet 3   PRESCRIPTION MEDICATION Inhale into the lungs See admin instructions. Bipap, pressure 16/12 with 2L of O2 - use whenever sleeping     sildenafil  (VIAGRA ) 50 MG tablet Take 50 mg by mouth as needed for erectile dysfunction.     spironolactone  (ALDACTONE ) 25 MG tablet TAKE 1 TABLET BY MOUTH EVERYDAY AT BEDTIME 90 tablet 3   torsemide  (DEMADEX ) 20 MG tablet Take 2 tablets (  40 mg total) by mouth every morning 60 tablet 6   Doxepin  HCl 6 MG TABS Take 1 tablet (6 mg total) by mouth at bedtime as needed. 30 tablet 1   methylPREDNISolone  (MEDROL  DOSEPAK) 4 MG TBPK tablet Day 1: 8mg  before breakfast, 4 mg after lunch, 4 mg after supper, and 8 mg at bedtime Day 2: 4 mg before breakfast, 4 mg after lunch, 4 mg  after supper, and 8 mg  at bedtime Day 3:  4 mg  before breakfast, 4 mg  after lunch, 4 mg after supper, and 4 mg  at bedtime Day 4: 4 mg  before breakfast, 4 mg  after lunch, and 4 mg at bedtime Day 5: 4 mg  before breakfast  and 4 mg at bedtime Day 6: 4 mg  before breakfast 1 each 0   tirzepatide  (MOUNJARO ) 2.5 MG/0.5ML Pen INJECT 2.5 MG SUBCUTANEOUSLY WEEKLY 2 mL 0   XARELTO  20 MG TABS tablet TAKE 1 TABLET BY MOUTH DAILY WITH SUPPER. 30 tablet 0   No facility-administered medications prior to visit.    ROS Review of Systems  Objective:  BP 110/82 (BP Location: Left Arm, Patient Position: Sitting)   Pulse 75   Temp 98.3 F (36.8 C) (Temporal)   Resp 16   Ht 5' 9 (1.753 m)   Wt (!) 345 lb 3.2 oz (156.6 kg)   SpO2 98%   BMI 50.98 kg/m   BP Readings from Last 3 Encounters:  10/18/24 110/82  10/07/24 102/80  09/20/24 102/68    Wt Readings from Last 3 Encounters:  10/18/24 (!) 345 lb 3.2 oz (156.6 kg)  10/07/24 (!) 345 lb (156.5 kg)  09/23/24 (!) 349 lb (158.3 kg)    Physical Exam  Lab Results  Component Value Date   WBC 6.7 09/19/2024   HGB 14.3 09/19/2024   HCT 41.7 09/19/2024   PLT 282 09/19/2024   GLUCOSE 97 09/20/2024   CHOL 143 10/18/2024   TRIG 110.0 10/18/2024   HDL 35.50 (L) 10/18/2024   LDLDIRECT 114.0 03/21/2020   LDLCALC 86 10/18/2024   ALT 9 09/20/2024   AST 12 09/20/2024   NA 140 09/20/2024   K 4.1 09/20/2024   CL 105 09/20/2024   CREATININE 1.25 09/20/2024   BUN 15 09/20/2024   CO2 20 09/20/2024   TSH 2.67 10/03/2024   PSA 0.71 10/18/2024   INR 1.5 (H) 09/29/2023   HGBA1C 6.6 (H) 10/18/2024   MICROALBUR 0.5 10/07/2024    No results found.  Assessment & Plan:  Type II diabetes mellitus with manifestations (HCC) -     Hemoglobin A1c; Future -     Hepatitis B surface antibody,quantitative; Future -     Mounjaro ; Inject 2.5 mg into the skin once a week.  Dispense: 2 mL; Refill: 0  Paroxysmal atrial fibrillation (HCC) -     Xarelto ; Take 1 tablet (20 mg total) by mouth daily with supper.  Dispense: 90 tablet; Refill: 1  History of pulmonary embolus (PE) -     Xarelto ; Take 1 tablet (20 mg total) by mouth daily with supper.  Dispense: 90 tablet; Refill:  1  Prostate cancer screening -     PSA; Future  Screening for colon cancer -     Ambulatory referral to Gastroenterology  Encounter for general adult medical examination with abnormal findings  Primary osteoarthritis involving multiple joints -     C-reactive protein; Future -     traMADol  HCl ER; Take 1 tablet (100  mg total) by mouth daily.  Dispense: 90 tablet; Refill: 1  Morbid obesity (HCC)  OSA (obstructive sleep apnea) with BiPap and oxygen  -     Pulmonary Visit  Hypertriglyceridemia -     Lipid panel; Future  Muscle cramps -     Magnesium ; Future -     CK; Future     Follow-up: Return in about 6 months (around 04/17/2025).  Debby Molt, MD

## 2024-10-18 NOTE — Patient Instructions (Signed)
 Health Maintenance, Male  Adopting a healthy lifestyle and getting preventive care are important in promoting health and wellness. Ask your health care provider about:  The right schedule for you to have regular tests and exams.  Things you can do on your own to prevent diseases and keep yourself healthy.  What should I know about diet, weight, and exercise?  Eat a healthy diet    Eat a diet that includes plenty of vegetables, fruits, low-fat dairy products, and lean protein.  Do not eat a lot of foods that are high in solid fats, added sugars, or sodium.  Maintain a healthy weight  Body mass index (BMI) is a measurement that can be used to identify possible weight problems. It estimates body fat based on height and weight. Your health care provider can help determine your BMI and help you achieve or maintain a healthy weight.  Get regular exercise  Get regular exercise. This is one of the most important things you can do for your health. Most adults should:  Exercise for at least 150 minutes each week. The exercise should increase your heart rate and make you sweat (moderate-intensity exercise).  Do strengthening exercises at least twice a week. This is in addition to the moderate-intensity exercise.  Spend less time sitting. Even light physical activity can be beneficial.  Watch cholesterol and blood lipids  Have your blood tested for lipids and cholesterol at 56 years of age, then have this test every 5 years.  You may need to have your cholesterol levels checked more often if:  Your lipid or cholesterol levels are high.  You are older than 56 years of age.  You are at high risk for heart disease.  What should I know about cancer screening?  Many types of cancers can be detected early and may often be prevented. Depending on your health history and family history, you may need to have cancer screening at various ages. This may include screening for:  Colorectal cancer.  Prostate cancer.  Skin cancer.  Lung  cancer.  What should I know about heart disease, diabetes, and high blood pressure?  Blood pressure and heart disease  High blood pressure causes heart disease and increases the risk of stroke. This is more likely to develop in people who have high blood pressure readings or are overweight.  Talk with your health care provider about your target blood pressure readings.  Have your blood pressure checked:  Every 3-5 years if you are 56-52 years of age.  Every year if you are 3 years old or older.  If you are between the ages of 60 and 72 and are a current or former smoker, ask your health care provider if you should have a one-time screening for abdominal aortic aneurysm (AAA).  Diabetes  Have regular diabetes screenings. This checks your fasting blood sugar level. Have the screening done:  Once every three years after age 56 if you are at a normal weight and have a low risk for diabetes.  More often and at a younger age if you are overweight or have a high risk for diabetes.  What should I know about preventing infection?  Hepatitis B  If you have a higher risk for hepatitis B, you should be screened for this virus. Talk with your health care provider to find out if you are at risk for hepatitis B infection.  Hepatitis C  Blood testing is recommended for:  Everyone born from [birth year] through 1963.  Anyone  with known risk factors for hepatitis C.  Sexually transmitted infections (STIs)  You should be screened each year for STIs, including gonorrhea and chlamydia, if:  You are sexually active and are younger than 56 years of age.  You are older than 56 years of age and your health care provider tells you that you are at risk for this type of infection.  Your sexual activity has changed since you were last screened, and you are at increased risk for chlamydia or gonorrhea. Ask your health care provider if you are at risk.  Ask your health care provider about whether you are at high risk for HIV. Your health care provider  may recommend a prescription medicine to help prevent HIV infection. If you choose to take medicine to prevent HIV, you should first get tested for HIV. You should then be tested every 3 months for as long as you are taking the medicine.  Follow these instructions at home:  Alcohol use  Do not drink alcohol if your health care provider tells you not to drink.  If you drink alcohol:  Limit how much you have to 0-2 drinks a day.  Know how much alcohol is in your drink. In the [place] one drink equals one 12 oz bottle of beer (355 mL), one 5 oz glass of wine (148 mL), or one 1 oz glass of hard liquor (44 mL).  Lifestyle  Do not use any products that contain nicotine or tobacco. These products include cigarettes, chewing tobacco, and vaping devices, such as e-cigarettes. If you need help quitting, ask your health care provider.  Do not use street drugs.  Do not share needles.  Ask your health care provider for help if you need support or information about quitting drugs.  General instructions  Schedule regular health, dental, and eye exams.  Stay current with your vaccines.  Tell your health care provider if:  You often feel depressed.  You have ever been abused or do not feel safe at home.  Summary  Adopting a healthy lifestyle and getting preventive care are important in promoting health and wellness.  Follow your health care provider's instructions about healthy diet, exercising, and getting tested or screened for diseases.  Follow your health care provider's instructions on monitoring your cholesterol and blood pressure.  This information is not intended to replace advice given to you by your health care provider. Make sure you discuss any questions you have with your health care provider.  Document Revised: 04/08/2021 Document Reviewed: 04/08/2021  Elsevier Patient Education  2024 [employer].

## 2024-10-19 ENCOUNTER — Ambulatory Visit: Payer: Self-pay | Admitting: Internal Medicine

## 2024-10-19 ENCOUNTER — Telehealth: Payer: Self-pay

## 2024-10-19 LAB — HEPATITIS B SURFACE ANTIBODY, QUANTITATIVE: Hep B S AB Quant (Post): <5 m[IU]/mL — ABNORMAL LOW (ref >=10)

## 2024-10-19 NOTE — Telephone Encounter (Signed)
 Copied from CRM [id number]. Topic: General - Call Back - No Documentation >> Oct 19, 2024 12:32 PM Rea C wrote: Reason for CRM: Patient called in and stated that Dr. Joshua sent the referral for [facility] in error for a colonscopy. Patient cannot do a colonscopy due to his heart issues. He just wanted to make Dr. Joshua aware of the error that the referral was sent to the GI Office.   [phone] (M)

## 2024-10-19 NOTE — Telephone Encounter (Signed)
 Copied from CRM [id number]. Topic: Clinical - Prescription Issue >> Oct 19, 2024 12:34 PM Rea C wrote: Reason for CRM: traMADol  (ULTRAM -ER) 100 MG 24 hr tablet  Patient stated that tramadol  is showing 7 day supply for pills for 25 refills at [facility], take one tablet my mouth per Dr. Debby Molt, MD. Patient is currently at the pharmacy.   Patient is asking is that the way it is supposed to go or something having to do with his insurance.   In the patients chart, it is showing once daily with 90 day supply. Patient just wants to know which is true, and is he having to go to the pharmacy every week to get a refill?   [phone] (M)

## 2024-10-21 NOTE — Telephone Encounter (Signed)
 I have advised the patient that his insurance is the reason why he's only getting 7 days at a time because the script was called in for 90 quantity with 1 refill. I advised him to reach out to the insurance company to figure out why they are dispensing his medication like that. He gave a verbal understanding.

## 2024-10-21 NOTE — Telephone Encounter (Signed)
 Just a FYI. His cardiologist has sent him a cologuard. He just wanted to make you aware.

## 2024-10-25 ENCOUNTER — Other Ambulatory Visit (HOSPITAL_COMMUNITY): Payer: Self-pay | Admitting: Cardiology

## 2024-10-25 ENCOUNTER — Ambulatory Visit: Payer: Medicare HMO

## 2024-10-25 DIAGNOSIS — I5022 Chronic systolic (congestive) heart failure: Secondary | ICD-10-CM

## 2024-10-25 LAB — CUP PACEART REMOTE DEVICE CHECK
Battery Remaining Longevity: 52 mo
Battery Remaining Percentage: 59 %
Battery Voltage: 2.96 V
Brady Statistic AP VP Percent: 66 %
Brady Statistic AP VS Percent: 2.5 %
Brady Statistic AS VP Percent: 31 %
Brady Statistic AS VS Percent: 1 %
Brady Statistic RA Percent Paced: 67 %
Date Time Interrogation Session: 20251125020140
HighPow Impedance: 69 Ohm
Implantable Lead Connection Status: 753985
Implantable Lead Connection Status: 753985
Implantable Lead Connection Status: 753985
Implantable Lead Implant Date: 20230213
Implantable Lead Implant Date: 20230213
Implantable Lead Implant Date: 20230213
Implantable Lead Location: 753857
Implantable Lead Location: 753859
Implantable Lead Location: 753860
Implantable Pulse Generator Implant Date: 20230213
Lead Channel Impedance Value: 1075 Ohm
Lead Channel Impedance Value: 400 Ohm
Lead Channel Impedance Value: 430 Ohm
Lead Channel Pacing Threshold Amplitude: 0.75 V
Lead Channel Pacing Threshold Amplitude: 0.75 V
Lead Channel Pacing Threshold Amplitude: 1 V
Lead Channel Pacing Threshold Pulse Width: 0.5 ms
Lead Channel Pacing Threshold Pulse Width: 0.5 ms
Lead Channel Pacing Threshold Pulse Width: 0.5 ms
Lead Channel Sensing Intrinsic Amplitude: 11.8 mV
Lead Channel Sensing Intrinsic Amplitude: 2.8 mV
Lead Channel Setting Pacing Amplitude: 1.5 V
Lead Channel Setting Pacing Amplitude: 2 V
Lead Channel Setting Pacing Amplitude: 2.5 V
Lead Channel Setting Pacing Pulse Width: 0.5 ms
Lead Channel Setting Pacing Pulse Width: 0.5 ms
Lead Channel Setting Sensing Sensitivity: 0.5 mV
Pulse Gen Serial Number: 210000080
Zone Setting Status: 755011

## 2024-10-26 NOTE — Progress Notes (Signed)
 Remote ICD Transmission

## 2024-10-27 ENCOUNTER — Emergency Department (HOSPITAL_BASED_OUTPATIENT_CLINIC_OR_DEPARTMENT_OTHER)

## 2024-10-27 ENCOUNTER — Other Ambulatory Visit: Payer: Self-pay

## 2024-10-27 ENCOUNTER — Encounter (HOSPITAL_BASED_OUTPATIENT_CLINIC_OR_DEPARTMENT_OTHER): Payer: Self-pay

## 2024-10-27 ENCOUNTER — Emergency Department (HOSPITAL_BASED_OUTPATIENT_CLINIC_OR_DEPARTMENT_OTHER)
Admission: EM | Admit: 2024-10-27 | Discharge: 2024-10-27 | Disposition: A | Discharge status: Home / Self Care | Attending: Emergency Medicine | Admitting: Emergency Medicine

## 2024-10-27 DIAGNOSIS — N400 Enlarged prostate without lower urinary tract symptoms: Secondary | ICD-10-CM | POA: Diagnosis not present

## 2024-10-27 DIAGNOSIS — Z7901 Long term (current) use of anticoagulants: Secondary | ICD-10-CM | POA: Insufficient documentation

## 2024-10-27 DIAGNOSIS — Z950 Presence of cardiac pacemaker: Secondary | ICD-10-CM | POA: Diagnosis not present

## 2024-10-27 DIAGNOSIS — N281 Cyst of kidney, acquired: Secondary | ICD-10-CM | POA: Diagnosis not present

## 2024-10-27 DIAGNOSIS — R590 Localized enlarged lymph nodes: Secondary | ICD-10-CM | POA: Insufficient documentation

## 2024-10-27 DIAGNOSIS — J984 Other disorders of lung: Secondary | ICD-10-CM | POA: Diagnosis not present

## 2024-10-27 DIAGNOSIS — K8020 Calculus of gallbladder without cholecystitis without obstruction: Secondary | ICD-10-CM | POA: Insufficient documentation

## 2024-10-27 DIAGNOSIS — D3502 Benign neoplasm of left adrenal gland: Secondary | ICD-10-CM | POA: Insufficient documentation

## 2024-10-27 DIAGNOSIS — I723 Aneurysm of iliac artery: Secondary | ICD-10-CM | POA: Diagnosis not present

## 2024-10-27 DIAGNOSIS — I480 Paroxysmal atrial fibrillation: Secondary | ICD-10-CM | POA: Diagnosis not present

## 2024-10-27 DIAGNOSIS — J189 Pneumonia, unspecified organism: Secondary | ICD-10-CM | POA: Diagnosis not present

## 2024-10-27 DIAGNOSIS — R0602 Shortness of breath: Secondary | ICD-10-CM | POA: Diagnosis not present

## 2024-10-27 DIAGNOSIS — R079 Chest pain, unspecified: Secondary | ICD-10-CM | POA: Diagnosis not present

## 2024-10-27 DIAGNOSIS — Z95810 Presence of automatic (implantable) cardiac defibrillator: Secondary | ICD-10-CM | POA: Diagnosis not present

## 2024-10-27 DIAGNOSIS — R918 Other nonspecific abnormal finding of lung field: Secondary | ICD-10-CM | POA: Diagnosis not present

## 2024-10-27 DIAGNOSIS — R0789 Other chest pain: Secondary | ICD-10-CM | POA: Diagnosis not present

## 2024-10-27 LAB — BASIC METABOLIC PANEL WITH GFR
Anion gap: 12 (ref 5–15)
BUN: 20 mg/dL (ref 6–20)
CO2: 21 mmol/L — ABNORMAL LOW (ref 22–32)
Calcium: 9.1 mg/dL (ref 8.9–10.3)
Chloride: 103 mmol/L (ref 98–111)
Creatinine, Ser: 1.59 mg/dL — ABNORMAL HIGH (ref 0.61–1.24)
GFR, Estimated: 51 mL/min — ABNORMAL LOW (ref >60)
Glucose, Bld: 114 mg/dL — ABNORMAL HIGH (ref 70–99)
Potassium: 4.9 mmol/L (ref 3.5–5.1)
Sodium: 136 mmol/L (ref 135–145)

## 2024-10-27 LAB — CBC
HCT: 43.6 % (ref 39.0–52.0)
Hemoglobin: 15.2 g/dL (ref 13.0–17.0)
MCH: 26.9 pg (ref 26.0–34.0)
MCHC: 34.9 g/dL (ref 30.0–36.0)
MCV: 77 fL — ABNORMAL LOW (ref 80.0–100.0)
Platelets: 296 K/uL (ref 150–400)
RBC: 5.66 MIL/uL (ref 4.22–5.81)
RDW: 15.5 % (ref 11.5–15.5)
WBC: 8.3 K/uL (ref 4.0–10.5)
nRBC: 0 % (ref 0.0–0.2)

## 2024-10-27 LAB — TROPONIN T, HIGH SENSITIVITY
Troponin T High Sensitivity: <15 ng/L (ref 0–19)
Troponin T High Sensitivity: <15 ng/L (ref 0–19)

## 2024-10-27 MED ORDER — AZITHROMYCIN 250 MG PO TABS: 250.0000 mg | ORAL_TABLET | Freq: Every day | ORAL | 0 refills | Status: AC

## 2024-10-27 MED ORDER — LIDOCAINE-EPINEPHRINE-TETRACAINE (LET) TOPICAL GEL
3.0000 mL | Freq: Once | TOPICAL | Status: AC
  Administered 2024-10-27: 3 mL via TOPICAL
  Filled 2024-10-27: qty 3

## 2024-10-27 MED ORDER — AZITHROMYCIN 250 MG PO TABS
500.0000 mg | ORAL_TABLET | Freq: Once | ORAL | Status: AC
  Administered 2024-10-27: 500 mg via ORAL
  Filled 2024-10-27: qty 2

## 2024-10-27 MED ORDER — HYDROMORPHONE HCL 1 MG/ML IJ SOLN
2.0000 mg | Freq: Once | INTRAMUSCULAR | Status: AC
Start: 2024-10-27 — End: 2024-10-27
  Administered 2024-10-27: 2 mg via INTRAMUSCULAR
  Filled 2024-10-27: qty 2

## 2024-10-27 MED ORDER — MORPHINE SULFATE (PF) 4 MG/ML IV SOLN
4.0000 mg | Freq: Once | INTRAVENOUS | Status: AC
  Administered 2024-10-27: 4 mg via INTRAVENOUS
  Filled 2024-10-27: qty 1

## 2024-10-27 MED ORDER — IOHEXOL 350 MG/ML SOLN
100.0000 mL | Freq: Once | INTRAVENOUS | Status: AC | PRN
  Administered 2024-10-27: 100 mL via INTRAVENOUS

## 2024-10-27 MED ORDER — SODIUM CHLORIDE 0.9 % IV SOLN
1.0000 g | Freq: Once | INTRAVENOUS | Status: AC
  Administered 2024-10-27: 1 g via INTRAVENOUS
  Filled 2024-10-27: qty 10

## 2024-10-27 NOTE — ED Notes (Addendum)
 Bilateral blood pressures obtained. Dr. Cottie notified of results. No new orders at this time.

## 2024-10-27 NOTE — ED Notes (Signed)
 Patient transferred from waiting room to ED treatment room. Assuming pt care at this time.

## 2024-10-27 NOTE — ED Notes (Signed)
 Pt currently on CT table. Multiple attempts made for IV access by multiple RN's. USIV attempted by Dr. Cottie x 2. All attempts unsuccessful. Per Dr. Cottie, pt is to be transferred back to ED RM, given IM pain medication, and then try again for USIV. CTA scan delayed d/t this.

## 2024-10-27 NOTE — ED Triage Notes (Signed)
 Pt sob, in to be seen for chest pain. Pain onset 30 minutes ago. Left chest and around to left shoulder.

## 2024-10-27 NOTE — ED Notes (Signed)
 Pt returned from CT at this time.

## 2024-10-27 NOTE — Discharge Instructions (Addendum)
 You are being treated for inflammation or infection of the lung called pneumonitis.  You were given antibiotics in the ER prescribed a few more days of antibiotics.  This infection can sometimes be caused by bacteria, and sometimes be caused by viruses.  Your symptoms are to improve over the next several days.  Please schedule follow-up appointment with your primary care provider.  You had a CT scan of your chest abdomen pelvis done in the ER today.  We talked about some incidental findings.  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  You also have a small cyst in your kidneys.  This did not need specific follow-up.    Your prostate also appeared enlarged.  This can potentially be an early sign of prostate cancer.  This he can follow-up with your primary care for, may need blood testing or PSA levels checked.  Your CT scan also showed that you have inflamed or enlarged lymph nodes in your chest.  This can be caused by multiple things, including infection, or certain types of cancer.  It is recommended that you follow-up for repeat CT imaging in 3 months of our chest for your lymph nodes.  Please follow up with your doctor for this issue.

## 2024-10-27 NOTE — ED Notes (Signed)
Pt transported back to CT at this time

## 2024-10-27 NOTE — ED Notes (Signed)
 Repeat troponin level delayed d/t pt being a difficult stick

## 2024-10-27 NOTE — ED Provider Notes (Signed)
 [place] [facility] Provider Note   CSN: [id number] Arrival date & time: 10/27/24  1741     Patient presents with: Chest Pain   [name] is a 58 y.o. male with history of nonischemic cardiomyopathy status post pacemaker, nonsustained V. tach, high cholesterol, paroxysmal A-fib on amiodarone  and Xarelto , obesity, high blood pressure, presented to the ED with chest pain.  Patient reports that this began gradually while he was driving today back from visiting a family member.  He reports he has sharp and heavy pain on the left side of his chest that radiates into his left shoulder.  He has never had this before.  He does not get regular chest pain or anginal pain.  He denies loss of consciousness, reports there is some pleuritic component, also reports the fingers in his left fingers appear to be tingling.  He denies that the pain is reproducible with movement.   HPI     Prior to Admission medications   Medication Sig Start Date End Date Taking? Authorizing Provider  azithromycin  (ZITHROMAX ) 250 MG tablet Take 1 tablet (250 mg total) by mouth daily for 4 days. Take first 2 tablets together, then 1 every day until finished. 10/28/24 11/01/24 Yes Cottie Donnice PARAS, MD  albuterol  (VENTOLIN  HFA) 108 [id number] Base) MCG/ACT inhaler INHALE 1-2 PUFFS INTO THE LUNGS EVERY 4 (FOUR) HOURS AS NEEDED FOR SHORTNESS OF BREATH OR WHEEZING. 06/04/22   Rolan Ezra RAMAN, MD  amiodarone  (PACERONE ) 200 MG tablet Take 0.5 tablets (100 mg total) by mouth daily. PLEASE SCHEDULE FOLLOW UP FOR MORE RERFILLS 08/09/24   Rolan Ezra RAMAN, MD  atorvastatin  (LIPITOR) 40 MG tablet TAKE 1 TABLET BY MOUTH EVERY DAY 09/30/24   Milford, Jessica M, FNP  carvedilol  (COREG ) 6.25 MG tablet Take 1 tablet (6.25 mg total) by mouth 2 (two) times daily. Patient taking differently: Take 6.25 mg by mouth daily at 6 (six) AM. 06/16/23   Milford, Harlene HERO, FNP  dapagliflozin  propanediol (FARXIGA ) 10 MG TABS  tablet Take 1 tablet (10 mg total) by mouth daily. 10/05/24   Rolan Ezra RAMAN, MD  gabapentin  (NEURONTIN ) 300 MG capsule Take 300 mg by mouth 3 (three) times daily. 09/02/24   [provider]  methimazole  (TAPAZOLE ) 5 MG tablet TAKE 1 TABLET (5 MG TOTAL) BY MOUTH DAILY. 09/19/24   Motwani, Komal, MD  pantoprazole  (PROTONIX ) 40 MG tablet TAKE 1 TABLET (40 MG TOTAL) BY MOUTH DAILY. SCHEDULE AN APPT FOR FURTHER REFILLS 08/25/24   Joshua Debby LITTIE, MD  potassium chloride  SA (KLOR-CON  M) 20 MEQ tablet TAKE 2 TABLETS BY MOUTH DAILY 08/10/24   Milford, Harlene HERO, FNP  PRESCRIPTION MEDICATION Inhale into the lungs See admin instructions. Bipap, pressure 16/12 with 2L of O2 - use whenever sleeping    [provider]  sildenafil  (VIAGRA ) 50 MG tablet TAKE 1 TABLET BY MOUTH AS NEEDED FOR ERECTILE DYSFUNCTION BEGIN  WITH  1  TABLET,  MAY  TAKE  2  IF  NECESSARY 10/25/24   Rolan Ezra RAMAN, MD  spironolactone  (ALDACTONE ) 25 MG tablet TAKE 1 TABLET BY MOUTH EVERYDAY AT BEDTIME 08/15/24   Colletta Manuelita Garre, PA-C  tirzepatide  (MOUNJARO ) 2.5 MG/0.5ML Pen Inject 2.5 mg into the skin once a week. 10/18/24   Joshua Debby LITTIE, MD  torsemide  (DEMADEX ) 20 MG tablet Take 2 tablets (40 mg total) by mouth every morning 02/22/24   McLean, Dalton S, MD  traMADol  (ULTRAM -ER) 100 MG 24 hr tablet Take  1 tablet (100 mg total) by mouth daily. 10/18/24   Joshua Debby CROME, MD  XARELTO  20 MG TABS tablet Take 1 tablet (20 mg total) by mouth daily with supper. 10/18/24   Joshua Debby CROME, MD  pravastatin  (PRAVACHOL ) 40 MG tablet Take 40 mg by mouth daily.  02/20/12  [provider]    Allergies: Ace inhibitors, Isosorbide , Buspirone, Doxycycline , Bidil [isosorb dinitrate-hydralazine ], Digoxin , Digoxin  and related, and Isosorbide  dinitrate    Review of Systems  Updated Vital Signs BP 102/86 (BP Location: Left Arm)   Pulse 73   Temp 98.2 F (36.8 C) (Oral)   Resp 18   Ht 5' 9 (1.753 m)   Wt (!) 157.6 kg    SpO2 98%   BMI 51.31 kg/m   Physical Exam Constitutional:      General: He is not in acute distress.    Appearance: He is obese.  HENT:     Head: Normocephalic and atraumatic.  Eyes:     Conjunctiva/sclera: Conjunctivae normal.     Pupils: Pupils are equal, round, and reactive to light.  Cardiovascular:     Rate and Rhythm: Normal rate and regular rhythm.  Pulmonary:     Effort: Pulmonary effort is normal. No respiratory distress.  Abdominal:     General: There is no distension.     Tenderness: There is no abdominal tenderness.  Skin:    General: Skin is warm and dry.  Neurological:     General: No focal deficit present.     Mental Status: He is alert. Mental status is at baseline.  Psychiatric:        Mood and Affect: Mood normal.        Behavior: Behavior normal.     (all labs ordered are listed, but only abnormal results are displayed) Labs Reviewed  BASIC METABOLIC PANEL WITH GFR - Abnormal; Notable for the following components:      Result Value   CO2 21 (*)    Glucose, Bld 114 (*)    Creatinine, Ser 1.59 (*)    GFR, Estimated 51 (*)    All other components within normal limits  CBC - Abnormal; Notable for the following components:   MCV 77.0 (*)    All other components within normal limits  TROPONIN T, HIGH SENSITIVITY  TROPONIN T, HIGH SENSITIVITY    EKG: EKG Interpretation Date/Time:  Thursday October 27 2024 17:51:07 EST Ventricular Rate:  79 PR Interval:  170 QRS Duration:  171 QT Interval:  416 QTC Calculation: 477 R Axis:   142  Text Interpretation: Sinus rhythm Nonspecific intraventricular conduction delay Minimal ST depression Confirmed by Cottie Cough [phone]) on 10/27/2024 5:55:27 PM  Radiology: CT Angio Chest/Abd/Pel for Dissection W and/or Wo Contrast Result Date: 10/27/2024 EXAM: CTA CHEST, ABDOMEN AND PELVIS WITH AND WITHOUT CONTRAST 10/27/2024 08:20:19 PM TECHNIQUE: CTA of the chest was performed with and without the administration  of intravenous contrast. CTA of the abdomen and pelvis was performed with the administration of intravenous contrast. Multiplanar reformatted images are provided for review. MIP images are provided for review. Automated exposure control, iterative reconstruction, and/or weight based adjustment of the mA/kV was utilized to reduce the radiation dose to as low as reasonably achievable. COMPARISON: Portable chest today, PA lat chest 09/19/2024, chest, abdomen and pelvis CT without contrast 10/02/2023, and CTA chest 01/14/2022. CLINICAL HISTORY: Shortness of breath and chest pain onset 30 minutes ago, left chest radiating to the left shoulder, left basilar infiltrates were suspected on  the portable chest today. FINDINGS: VASCULATURE: AORTA: The aorta is normal in course and caliber. The thoracic aorta is plaque-free. There is minimal scattered calcific plaque in the abdominal aorta without aneurysm, stenosis, or dissection. PULMONARY ARTERIES: There is an enlarged pulmonary trunk, 4.0 cm indicating arterial hypertension, previously 3.3 cm. No central embolism is seen, but opacification of pulmonary arteries does not extend beyond the lobar main arteries due to the systemic arterial technique. GREAT VESSELS OF AORTIC ARCH: The great vessels are clear. No acute finding. No dissection. No arterial occlusion or significant stenosis. CELIAC TRUNK: The celiac artery is widely patent. There is a separate, also widely patent aortic origin for the left gastric artery, and early celiac branching of the common hepatic artery. No occlusion or significant stenosis. SUPERIOR MESENTERIC ARTERY: The SMA is widely patent. There is a replaced widely patent right hepatic artery arising from it. SMA branch opacification is not well depicted due to beam hardening owing to patient body habitus. No occlusion or significant stenosis. INFERIOR MESENTERIC ARTERY: No acute finding. No occlusion or significant stenosis. RENAL ARTERIES: There is a  single widely patent right renal artery. On the left, a small-caliber artery arises first and supplies a portion of the upper pole of the kidney. The main renal artery arises second. Both vessels are widely patent. No occlusion or significant stenosis. ILIAC ARTERIES: There are mild patchy calcific plaques in the iliac arteries, with tortuous but clear common iliac, internal and external iliac arteries. Generalized borderline aneurysmal ectasia is seen in both common iliac arteries, up to 1.7 cm on the right, 1.9 cm on the left with asymmetrically ectatic left internal iliac artery measuring 1.5 cm, also borderline aneurysmal. No occlusion or significant stenosis. CHEST: MEDIASTINUM: Metal artifact again noted due to a left chest dual lead pacing system with separate aid wire through the coronary sinus. Central pulmonary veins are prominent, similar to the 2023 study but increased since 2024. The heart is moderately enlarged with left chamber predominance and dilated left ventricle, again similar to 2023 but increased from 2024. No visible coronary artery calcification. There is increased prominence of mediastinal lymph nodes, up to 1.7 cm short axis in the right paratracheal chain, 1.5 cm in the subcarinal space, with bilateral scattered hilar lymph nodes up to 1.3 cm. There is an AP window node measuring 1.8 cm (series 11, axial 54). No bulky or encasing adenopathy is seen. The esophagus is unremarkable. LUNGS AND PLEURA: Lung apices again show mild paraseptal emphysematous change. There is mild diffuse bronchial thickening. There are patchy hazy opacities in both lower lobes which could be due to pneumonitis, mosaicism related to air trapping, or combination. The upper and right middle lobes are clear of infiltrates. Mildly There is mild chronic subpleural reticulation in the upper lobes. No pleural effusion or overt edema. Central airways and trachea are clear. No pneumothorax. THORACIC BONES AND SOFT TISSUES:  Thoracic spine again with extensive bridging enthesopathy. No acute or significant thoracic osseous finding. There is discoid subareolar gynecomastia bilaterally. ABDOMEN AND PELVIS: LIMITATIONS/ARTIFACTS: There is loss of fine detail in the abdomen and pelvis due to beam hardening and body habitus. The images are grainy. LIVER: The liver is 20 cm in length with moderate steatosis without evidence of mass. GALLBLADDER AND BILE DUCTS: There is a 1.6 cm single rim-calcified stone in the proximal gallbladder without overt wall thickening. No biliary ductal dilatation. SPLEEN: The spleen is unremarkable. PANCREAS: The pancreas is unremarkable. ADRENAL GLANDS: There is a 1.8 cm stable  left adrenal adenoma, Hounsfield density is 8. There is no right adrenal mass. KIDNEYS, URETERS AND BLADDER: No renal mass. There is a 1.5 cm Bosniak 1 cyst anteriorly in the right kidney, Hounsfield density is 19. No follow-up imaging is recommended. No stones in the kidneys or ureters. No hydronephrosis. No perinephric or periureteral stranding. Urinary bladder is unremarkable. GI AND BOWEL: Stomach and duodenal sweep demonstrate no acute abnormality. There is a normal caliber appendix. Scattered uncomplicated colonic diverticulosis. No bowel obstruction or inflammation. No abnormal bowel wall thickening or distension. REPRODUCTIVE: Enlarged prostate is seen measuring 4.8 cm. PERITONEUM AND RETROPERITONEUM: No ascites or free air. LYMPH NODES: No lymphadenopathy. ABDOMINAL BONES AND SOFT TISSUES: Degenerative spondylosis and facet hypertrophy of the lumbar spine. Mild hip arthrosis is noted with ankylosis over both SI joints. Acquired spinal stenosis at L4-L5. There are small umbilical and inguinal fat hernias. No acute soft tissue abnormality. IMPRESSION: 1. No evidence of acute aortic syndrome. Only minimal atherosclerosis is seen, in the distal abdominal aorta. 2. Enlarged pulmonary trunk measuring 4.0 cm, increased from 3.3 cm,  compatible with pulmonary arterial hypertension; no central pulmonary embolism is identified. 3. Moderate cardiomegaly with left-sided chamber predominance, central venous distention and dilated left ventricle, similar to 2023 and increased from 2024. 4. Patchy hazy opacities in both lower lobes that may reflect pneumonitis versus mosaic attenuation from air trapping, or both. Mild diffuse bronchial thickening. 5. Mediastinal lymphadenopathy with nodes up to 1.8 cm in the aortopulmonary window without bulky or encasing disease. 3 month follow up CT or PET CT recommended. 6. Enlarged prostate. 7. Steatosis of the liver . Cholelithiasis. 8. Atherosclerosis and tortuosity with borderline aneurysmal common iliac arteries and left internal iliac artery. No stenosis or dissection. 9. Acquired spinal stenosis L4-L5. [name] Electronically signed by: Francis Quam MD 10/27/2024 09:12 PM EST RP Workstation: [id number]   DG Chest Portable 1 View Result Date: 10/27/2024 CLINICAL DATA:  Shortness of breath and chest pain EXAM: PORTABLE CHEST 1 VIEW COMPARISON:  Chest radiograph dated 09/19/2024 FINDINGS: Left chest wall ICD leads project over the right atrium and ventricle and tributary of the coronary sinus. Normal lung volumes. Right basilar and left retrocardiac patchy opacities. No pleural effusion or pneumothorax. Similar enlarged cardiomediastinal silhouette. No acute osseous abnormality. IMPRESSION: 1. Right basilar and left retrocardiac patchy opacities, which may represent atelectasis, aspiration, or pneumonia. 2. Similar cardiomegaly. Electronically Signed   By: Limin  Xu M.D.   On: 10/27/2024 18:35     Procedures   Medications Ordered in the ED  morphine  (PF) 4 MG/ML injection 4 mg (4 mg Intravenous Given 10/27/24 1806)  iohexol  (OMNIPAQUE ) 350 MG/ML injection 100 mL (100 mLs Intravenous Contrast Given 10/27/24 1843)  HYDROmorphone  (DILAUDID ) injection 2 mg (2 mg Intramuscular Given 10/27/24 1933)   lidocaine -EPINEPHrine -tetracaine  (LET) topical gel (3 mLs Topical Given 10/27/24 1933)  cefTRIAXone  (ROCEPHIN ) 1 g in sodium chloride  0.9 % 100 mL IVPB (0 g Intravenous Stopped 10/27/24 2214)  azithromycin  (ZITHROMAX ) tablet 500 mg (500 mg Oral Given 10/27/24 2139)    Clinical Course as of 10/27/24 2215  Thu Oct 27, 2024  1925 IV reportedly infiltrated with saline check at CT scan.  I attempted bedside ultrasound IV but patient would not tolerate due to reported pain (in chest and at IV sticking site). I've advised IM pain medication with dilaudid  as he reported morphine  did not help.  He remains completely HD normal and stable - no elevated BP or HR; initial troponin undetectable. [MT]  2011 IV  placed, back to CT [MT]    Clinical Course User Index [MT] Jordani Nunn, Donnice PARAS, MD                                 Medical Decision Making Amount and/or Complexity of Data Reviewed Labs: ordered. Radiology: ordered.  Risk Prescription drug management.   This patient presents to the Emergency Department with complaint of chest pain. This involves an extensive number of treatment options, and is a complaint that carries with it a high risk of complications and morbidity, given the patient's comorbidity, including cardiovascular disease.The differential diagnosis includes ACS vs Pneumothorax vs Reflux/Gastritis vs MSK pain vs Pneumonia vs other.  I felt PE was less likely given that patient is compliant with Xarelto , no other acute risk factors, no hypoxia.  I ordered, reviewed, and interpreted labs.  Pertinent results include no emergent findings I ordered medication for chest pain; IV antibiotics for pneumonitis I ordered imaging studies which included x-ray of the chest, CT chest abdomen pelvis dissection protocol I independently visualized and interpreted imaging which showed likely pneumonitis findings of the lungs, several incidental findings as noted in discharge, for which the patient may  need follow-up for and the monitor tracing which sinus rhythm. I agree with the radiologist interpretation I personally reviewed the patients ECG which sinus rhythm no acute ischemia After the interventions stated above, I reevaluated the patient and found that they were pain is significantly improved.  Pain is now extremely minimal.  Based on the patient's clinical exam, vital signs, risk factors, and ED testing, I felt that the patient's overall risk of life-threatening emergency such as ACS, PE, sepsis, or infection was low.  At this time, I felt the patient's presentation was most clinically consistent with lung infection, but explained to the patient that this evaluation was not a definitive diagnostic workup.  I discussed outpatient follow up with primary care provider, and provided specialist office number on the patient's discharge paper if a referral was deemed necessary.  Return precautions were discussed with the patient.  I felt the patient was clinically stable for discharge.  We discussed follow-up versus other incidental findings including enlarged lymph nodes and enlarged prostate.  He says his doctor does check his PSA levels.     Final diagnoses:  Pneumonitis  Enlarged prostate  Adenoma of left adrenal gland  Renal cyst  Calculus of gallbladder without cholecystitis without obstruction  Mediastinal lymphadenopathy    ED Discharge Orders          Ordered    azithromycin  (ZITHROMAX ) 250 MG tablet  Daily        10/27/24 2211               Cottie Donnice PARAS, MD 10/27/24 2215

## 2024-10-29 ENCOUNTER — Other Ambulatory Visit: Payer: Self-pay

## 2024-10-29 ENCOUNTER — Encounter (HOSPITAL_COMMUNITY): Payer: Self-pay | Admitting: *Deleted

## 2024-10-29 ENCOUNTER — Emergency Department (HOSPITAL_COMMUNITY): Admission: EM | Admit: 2024-10-29 | Discharge: 2024-10-29 | Disposition: A | Discharge status: Home / Self Care

## 2024-10-29 ENCOUNTER — Emergency Department (HOSPITAL_COMMUNITY)

## 2024-10-29 DIAGNOSIS — Z7901 Long term (current) use of anticoagulants: Secondary | ICD-10-CM | POA: Insufficient documentation

## 2024-10-29 DIAGNOSIS — R0789 Other chest pain: Secondary | ICD-10-CM | POA: Insufficient documentation

## 2024-10-29 DIAGNOSIS — I517 Cardiomegaly: Secondary | ICD-10-CM | POA: Diagnosis not present

## 2024-10-29 DIAGNOSIS — R0989 Other specified symptoms and signs involving the circulatory and respiratory systems: Secondary | ICD-10-CM | POA: Diagnosis not present

## 2024-10-29 DIAGNOSIS — M25532 Pain in left wrist: Secondary | ICD-10-CM | POA: Insufficient documentation

## 2024-10-29 DIAGNOSIS — R918 Other nonspecific abnormal finding of lung field: Secondary | ICD-10-CM | POA: Diagnosis not present

## 2024-10-29 MED ORDER — HYDROCODONE-ACETAMINOPHEN 5-325 MG PO TABS: 1.0000 | ORAL_TABLET | ORAL | 0 refills | Status: DC | PRN

## 2024-10-29 MED ORDER — HYDROCODONE-ACETAMINOPHEN 5-325 MG PO TABS
1.0000 | ORAL_TABLET | Freq: Once | ORAL | Status: AC
  Administered 2024-10-29: 1 via ORAL
  Filled 2024-10-29: qty 1

## 2024-10-29 NOTE — ED Triage Notes (Signed)
 States he was at [facility] last thurs for chest pain several attempts were made at that time for IV , states they used u/s and during one of the attempts he had severe pain in left arm and states now it is hard to move his arm. Positive  radial pulse

## 2024-10-29 NOTE — ED Provider Notes (Signed)
 [name] AT [facility] Provider Note   CSN: [id number] Arrival date & time: 10/29/24  9198     Patient presents with: Arm Pain   [name] is a 58 y.o. male.   Patient complains of pain in his left arm after having an IV placed 2 days ago.  Patient reports he was being evaluated for chest pain and was diagnosed with pneumonia.  Patient reports that he had to have multiple needlesticks in order to obtain an IV.  Patient states that he has pain at the site of ultrasound IV.  Patient states that they had today go around in order to get an IV.  Patient states that his pain starts in his elbow and that he has pain that goes down to his hand.  He reports his hand feels swollen.  Patient was diagnosed with pneumonia he was started on Zithromax .  Patient has a past medical history of atrial fibrillation he currently is on a blood thinner.  Patient states he still has the same pain in the left side of his chest it is worse when he breathes it is the same pain that he was told came from pneumonia.  Patient states that the antibiotic that he was given had precautions that it could cause interactions with heart medicines and he is concerned as to whether he should take it or not.  The history is provided by the patient. No language interpreter was used.  Arm Pain Associated symptoms include chest pain. Pertinent negatives include no shortness of breath.       Prior to Admission medications   Medication Sig Start Date End Date Taking? Authorizing Provider  HYDROcodone -acetaminophen  (NORCO/VICODIN) 5-325 MG tablet Take 1 tablet by mouth every 4 (four) hours as needed. 10/29/24 10/29/25 Yes Legacy Carrender K, PA-C  albuterol  (VENTOLIN  HFA) 108 (90 Base) MCG/ACT inhaler INHALE 1-2 PUFFS INTO THE LUNGS EVERY 4 (FOUR) HOURS AS NEEDED FOR SHORTNESS OF BREATH OR WHEEZING. 06/04/22   Rolan Ezra RAMAN, MD  amiodarone  (PACERONE ) 200 MG tablet Take 0.5 tablets (100 mg total) by  mouth daily. PLEASE SCHEDULE FOLLOW UP FOR MORE RERFILLS 08/09/24   Rolan Ezra RAMAN, MD  atorvastatin  (LIPITOR) 40 MG tablet TAKE 1 TABLET BY MOUTH EVERY DAY 09/30/24   Milford, Jessica M, FNP  azithromycin  (ZITHROMAX ) 250 MG tablet Take 1 tablet (250 mg total) by mouth daily for 4 days. Take first 2 tablets together, then 1 every day until finished. 10/28/24 11/01/24  Cottie Donnice PARAS, MD  carvedilol  (COREG ) 6.25 MG tablet Take 1 tablet (6.25 mg total) by mouth 2 (two) times daily. Patient taking differently: Take 6.25 mg by mouth daily at 6 (six) AM. 06/16/23   Milford, Harlene HERO, FNP  dapagliflozin  propanediol (FARXIGA ) 10 MG TABS tablet Take 1 tablet (10 mg total) by mouth daily. 10/05/24   Rolan Ezra RAMAN, MD  gabapentin  (NEURONTIN ) 300 MG capsule Take 300 mg by mouth 3 (three) times daily. 09/02/24   [provider]  methimazole  (TAPAZOLE ) 5 MG tablet TAKE 1 TABLET (5 MG TOTAL) BY MOUTH DAILY. 09/19/24   Motwani, Komal, MD  pantoprazole  (PROTONIX ) 40 MG tablet TAKE 1 TABLET (40 MG TOTAL) BY MOUTH DAILY. SCHEDULE AN APPT FOR FURTHER REFILLS 08/25/24   Joshua Debby LITTIE, MD  potassium chloride  SA (KLOR-CON  M) 20 MEQ tablet TAKE 2 TABLETS BY MOUTH DAILY 08/10/24   Milford, Harlene HERO, FNP  PRESCRIPTION MEDICATION Inhale into the lungs See admin instructions. Bipap, pressure 16/12  with 2L of O2 - use whenever sleeping    [provider]  sildenafil  (VIAGRA ) 50 MG tablet TAKE 1 TABLET BY MOUTH AS NEEDED FOR ERECTILE DYSFUNCTION BEGIN  WITH  1  TABLET,  MAY  TAKE  2  IF  NECESSARY 10/25/24   Rolan Ezra RAMAN, MD  spironolactone  (ALDACTONE ) 25 MG tablet TAKE 1 TABLET BY MOUTH EVERYDAY AT BEDTIME 08/15/24   Colletta Manuelita Garre, PA-C  tirzepatide  (MOUNJARO ) 2.5 MG/0.5ML Pen Inject 2.5 mg into the skin once a week. 10/18/24   Joshua Debby CROME, MD  torsemide  (DEMADEX ) 20 MG tablet Take 2 tablets (40 mg total) by mouth every morning 02/22/24   McLean, Dalton S, MD  traMADol  (ULTRAM -ER) 100 MG 24 hr  tablet Take 1 tablet (100 mg total) by mouth daily. 10/18/24   Joshua Debby CROME, MD  XARELTO  20 MG TABS tablet Take 1 tablet (20 mg total) by mouth daily with supper. 10/18/24   Joshua Debby CROME, MD  pravastatin  (PRAVACHOL ) 40 MG tablet Take 40 mg by mouth daily.  02/20/12  [provider]    Allergies: Ace inhibitors, Isosorbide , Buspirone, Doxycycline , Bidil [isosorb dinitrate-hydralazine ], Digoxin , Digoxin  and related, and Isosorbide  dinitrate    Review of Systems  Respiratory:  Positive for chest tightness. Negative for shortness of breath and wheezing.   Cardiovascular:  Positive for chest pain.  All other systems reviewed and are negative.   Updated Vital Signs BP 121/86 (BP Location: Right Arm)   Pulse (!) 51   Temp (!) 97.5 F (36.4 C) (Oral)   Resp 18   Ht 5' 9 (1.753 m)   Wt (!) 154.2 kg   SpO2 97%   BMI 50.21 kg/m   Physical Exam Vitals and nursing note reviewed.  Constitutional:      Appearance: He is well-developed.  HENT:     Head: Normocephalic.     Mouth/Throat:     Mouth: Mucous membranes are moist.  Cardiovascular:     Rate and Rhythm: Normal rate and regular rhythm.  Pulmonary:     Effort: Pulmonary effort is normal.     Breath sounds: Normal breath sounds.  Abdominal:     General: There is no distension.  Musculoskeletal:        General: Normal range of motion.     Cervical back: Normal range of motion.     Comments: Left arm 2 cm area of bruising antecubital area, no significant hematoma full range of motion elbow wrist and fingers, good pulses neurosensory is intact.  No arm swelling, no swelling in upper arm  Skin:    General: Skin is warm.  Neurological:     General: No focal deficit present.     Mental Status: He is alert and oriented to person, place, and time.  Psychiatric:        Mood and Affect: Mood normal.     (all labs ordered are listed, but only abnormal results are displayed) Labs Reviewed - No data to  display  EKG: EKG Interpretation Date/Time:  Saturday October 29 2024 09:21:31 EST Ventricular Rate:  76 PR Interval:  168 QRS Duration:  114 QT Interval:  418 QTC Calculation: 470 R Axis:   124  Text Interpretation: Atrial-sensed ventricular-paced rhythm with occasional atrial-paced complexes  Baseline artifact  Compared with prior EKG from 10/27/2024 Confirmed by Gennaro Bouchard ([id number]) on 10/29/2024 9:33:43 AM  Radiology: [facility] Chest 2 View Result Date: 10/29/2024 CLINICAL DATA:  Pneumonia EXAM: CHEST - 2 VIEW COMPARISON:  Prior chest x-ray 10/27/2024 FINDINGS: Stable appearance of biventricular cardiac rhythm maintenance device via the left subclavian vein. Stable cardiomegaly. Mild vascular congestion without overt edema. Mild patchy left lower lobe airspace opacity may reflect atelectasis or pneumonia. IMPRESSION: 1. Mild left lower lobe patchy airspace opacity may reflect atelectasis or pneumonia. 2. Stable cardiomegaly with left subclavian approach biventricular cardiac rhythm maintenance device in place. 3. Mild pulmonary vascular congestion without overt edema. Electronically Signed   By: Wilkie Lent M.D.   On: 10/29/2024 09:46   CT Angio Chest/Abd/Pel for Dissection W and/or Wo Contrast Result Date: 10/27/2024 EXAM: CTA CHEST, ABDOMEN AND PELVIS WITH AND WITHOUT CONTRAST 10/27/2024 08:20:19 PM TECHNIQUE: CTA of the chest was performed with and without the administration of intravenous contrast. CTA of the abdomen and pelvis was performed with the administration of intravenous contrast. Multiplanar reformatted images are provided for review. MIP images are provided for review. Automated exposure control, iterative reconstruction, and/or weight based adjustment of the mA/kV was utilized to reduce the radiation dose to as low as reasonably achievable. COMPARISON: Portable chest today, PA lat chest 09/19/2024, chest, abdomen and pelvis CT without contrast 10/02/2023, and CTA chest  01/14/2022. CLINICAL HISTORY: Shortness of breath and chest pain onset 30 minutes ago, left chest radiating to the left shoulder, left basilar infiltrates were suspected on the portable chest today. FINDINGS: VASCULATURE: AORTA: The aorta is normal in course and caliber. The thoracic aorta is plaque-free. There is minimal scattered calcific plaque in the abdominal aorta without aneurysm, stenosis, or dissection. PULMONARY ARTERIES: There is an enlarged pulmonary trunk, 4.0 cm indicating arterial hypertension, previously 3.3 cm. No central embolism is seen, but opacification of pulmonary arteries does not extend beyond the lobar main arteries due to the systemic arterial technique. GREAT VESSELS OF AORTIC ARCH: The great vessels are clear. No acute finding. No dissection. No arterial occlusion or significant stenosis. CELIAC TRUNK: The celiac artery is widely patent. There is a separate, also widely patent aortic origin for the left gastric artery, and early celiac branching of the common hepatic artery. No occlusion or significant stenosis. SUPERIOR MESENTERIC ARTERY: The SMA is widely patent. There is a replaced widely patent right hepatic artery arising from it. SMA branch opacification is not well depicted due to beam hardening owing to patient body habitus. No occlusion or significant stenosis. INFERIOR MESENTERIC ARTERY: No acute finding. No occlusion or significant stenosis. RENAL ARTERIES: There is a single widely patent right renal artery. On the left, a small-caliber artery arises first and supplies a portion of the upper pole of the kidney. The main renal artery arises second. Both vessels are widely patent. No occlusion or significant stenosis. ILIAC ARTERIES: There are mild patchy calcific plaques in the iliac arteries, with tortuous but clear common iliac, internal and external iliac arteries. Generalized borderline aneurysmal ectasia is seen in both common iliac arteries, up to 1.7 cm on the right, 1.9  cm on the left with asymmetrically ectatic left internal iliac artery measuring 1.5 cm, also borderline aneurysmal. No occlusion or significant stenosis. CHEST: MEDIASTINUM: Metal artifact again noted due to a left chest dual lead pacing system with separate aid wire through the coronary sinus. Central pulmonary veins are prominent, similar to the 2023 study but increased since 2024. The heart is moderately enlarged with left chamber predominance and dilated left ventricle, again similar to 2023 but increased from 2024. No visible coronary artery calcification. There is increased prominence of mediastinal lymph nodes, up to 1.7 cm short axis  in the right paratracheal chain, 1.5 cm in the subcarinal space, with bilateral scattered hilar lymph nodes up to 1.3 cm. There is an AP window node measuring 1.8 cm (series 11, axial 54). No bulky or encasing adenopathy is seen. The esophagus is unremarkable. LUNGS AND PLEURA: Lung apices again show mild paraseptal emphysematous change. There is mild diffuse bronchial thickening. There are patchy hazy opacities in both lower lobes which could be due to pneumonitis, mosaicism related to air trapping, or combination. The upper and right middle lobes are clear of infiltrates. Mildly There is mild chronic subpleural reticulation in the upper lobes. No pleural effusion or overt edema. Central airways and trachea are clear. No pneumothorax. THORACIC BONES AND SOFT TISSUES: Thoracic spine again with extensive bridging enthesopathy. No acute or significant thoracic osseous finding. There is discoid subareolar gynecomastia bilaterally. ABDOMEN AND PELVIS: LIMITATIONS/ARTIFACTS: There is loss of fine detail in the abdomen and pelvis due to beam hardening and body habitus. The images are grainy. LIVER: The liver is 20 cm in length with moderate steatosis without evidence of mass. GALLBLADDER AND BILE DUCTS: There is a 1.6 cm single rim-calcified stone in the proximal gallbladder without  overt wall thickening. No biliary ductal dilatation. SPLEEN: The spleen is unremarkable. PANCREAS: The pancreas is unremarkable. ADRENAL GLANDS: There is a 1.8 cm stable left adrenal adenoma, Hounsfield density is 8. There is no right adrenal mass. KIDNEYS, URETERS AND BLADDER: No renal mass. There is a 1.5 cm Bosniak 1 cyst anteriorly in the right kidney, Hounsfield density is 19. No follow-up imaging is recommended. No stones in the kidneys or ureters. No hydronephrosis. No perinephric or periureteral stranding. Urinary bladder is unremarkable. GI AND BOWEL: Stomach and duodenal sweep demonstrate no acute abnormality. There is a normal caliber appendix. Scattered uncomplicated colonic diverticulosis. No bowel obstruction or inflammation. No abnormal bowel wall thickening or distension. REPRODUCTIVE: Enlarged prostate is seen measuring 4.8 cm. PERITONEUM AND RETROPERITONEUM: No ascites or free air. LYMPH NODES: No lymphadenopathy. ABDOMINAL BONES AND SOFT TISSUES: Degenerative spondylosis and facet hypertrophy of the lumbar spine. Mild hip arthrosis is noted with ankylosis over both SI joints. Acquired spinal stenosis at L4-L5. There are small umbilical and inguinal fat hernias. No acute soft tissue abnormality. IMPRESSION: 1. No evidence of acute aortic syndrome. Only minimal atherosclerosis is seen, in the distal abdominal aorta. 2. Enlarged pulmonary trunk measuring 4.0 cm, increased from 3.3 cm, compatible with pulmonary arterial hypertension; no central pulmonary embolism is identified. 3. Moderate cardiomegaly with left-sided chamber predominance, central venous distention and dilated left ventricle, similar to 2023 and increased from 2024. 4. Patchy hazy opacities in both lower lobes that may reflect pneumonitis versus mosaic attenuation from air trapping, or both. Mild diffuse bronchial thickening. 5. Mediastinal lymphadenopathy with nodes up to 1.8 cm in the aortopulmonary window without bulky or encasing  disease. 3 month follow up CT or PET CT recommended. 6. Enlarged prostate. 7. Steatosis of the liver . Cholelithiasis. 8. Atherosclerosis and tortuosity with borderline aneurysmal common iliac arteries and left internal iliac artery. No stenosis or dissection. 9. Acquired spinal stenosis L4-L5. [name] Electronically signed by: Francis Quam MD 10/27/2024 09:12 PM EST RP Workstation: [id number]   DG Chest Portable 1 View Result Date: 10/27/2024 CLINICAL DATA:  Shortness of breath and chest pain EXAM: PORTABLE CHEST 1 VIEW COMPARISON:  Chest radiograph dated 09/19/2024 FINDINGS: Left chest wall ICD leads project over the right atrium and ventricle and tributary of the coronary sinus. Normal lung volumes. Right basilar and  left retrocardiac patchy opacities. No pleural effusion or pneumothorax. Similar enlarged cardiomediastinal silhouette. No acute osseous abnormality. IMPRESSION: 1. Right basilar and left retrocardiac patchy opacities, which may represent atelectasis, aspiration, or pneumonia. 2. Similar cardiomegaly. Electronically Signed   By: Limin  Xu M.D.   On: 10/27/2024 18:35     Procedures   Medications Ordered in the ED  HYDROcodone -acetaminophen  (NORCO/VICODIN) 5-325 MG per tablet 1 tablet (1 tablet Oral Given 10/29/24 1046)                                    Medical Decision Making Patient complains of continued pain in his chest he is unsure if he should continue the antibiotic he also has pain at the site of his IV that he would like to have evaluated  Amount and/or Complexity of Data Reviewed Radiology: ordered.    Details: Chest x-ray shows right basilar and left retrocardiac patchy opacities which could be atelectasis or pneumonia ECG/medicine tests: ordered and independent interpretation performed. Decision-making details documented in ED Course.    Details: EKG normal sinus no acute findings  Risk Prescription drug management. Risk Details: Patient counseled on drug  interactions.  He is advised to continue the antibiotic that he is taking.  He is afebrile.  Patient looks well.  I counseled him on possible superficial thrombophlebitis from IV stick he is advised warm compresses.  He is advised that he does not need antibiotics and that he is already on anticoagulation so he does not need to be concerned that he has a blood clot in this area.  Patient is given a prescription for 10 hydrocodone  to help with his arm and his chest discomfort from the pneumonia.  He is advised to schedule to see his primary care physician next week return to the emergency department if symptoms worsen or change.        Final diagnoses:  Chest wall pain  Left wrist pain    ED Discharge Orders          Ordered    HYDROcodone -acetaminophen  (NORCO/VICODIN) 5-325 MG tablet  Every 4 hours PRN        10/29/24 1042            An After Visit Summary was printed and given to the patient.    Flint Sonny POUR, PA-C 10/29/24 1328    Kammerer, Megan L, DO 10/30/24 [phone]

## 2024-10-29 NOTE — Discharge Instructions (Addendum)
 Return if any problems.  Continue antibiotics.  Warm compresses to area of IV stick.  Follow up with your Physicain next week.  Take medication for pain

## 2024-10-31 ENCOUNTER — Ambulatory Visit: Payer: Self-pay | Admitting: Cardiology

## 2024-11-04 ENCOUNTER — Ambulatory Visit: Admitting: Sleep Medicine
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
## 2024-11-07 ENCOUNTER — Emergency Department (HOSPITAL_COMMUNITY)

## 2024-11-07 ENCOUNTER — Encounter (HOSPITAL_COMMUNITY): Payer: Self-pay

## 2024-11-07 ENCOUNTER — Ambulatory Visit: Payer: Self-pay

## 2024-11-07 ENCOUNTER — Other Ambulatory Visit: Payer: Self-pay

## 2024-11-07 ENCOUNTER — Emergency Department (HOSPITAL_COMMUNITY)
Admission: EM | Admit: 2024-11-07 | Discharge: 2024-11-07 | Disposition: A | Discharge status: Home / Self Care | Attending: Emergency Medicine | Admitting: Emergency Medicine

## 2024-11-07 DIAGNOSIS — J189 Pneumonia, unspecified organism: Secondary | ICD-10-CM

## 2024-11-07 DIAGNOSIS — G4733 Obstructive sleep apnea (adult) (pediatric): Secondary | ICD-10-CM | POA: Insufficient documentation

## 2024-11-07 DIAGNOSIS — T148XXA Other injury of unspecified body region, initial encounter: Secondary | ICD-10-CM

## 2024-11-07 DIAGNOSIS — Z7984 Long term (current) use of oral hypoglycemic drugs: Secondary | ICD-10-CM | POA: Diagnosis not present

## 2024-11-07 DIAGNOSIS — Z8701 Personal history of pneumonia (recurrent): Secondary | ICD-10-CM | POA: Diagnosis present

## 2024-11-07 DIAGNOSIS — K8020 Calculus of gallbladder without cholecystitis without obstruction: Secondary | ICD-10-CM | POA: Diagnosis not present

## 2024-11-07 DIAGNOSIS — E1122 Type 2 diabetes mellitus with diabetic chronic kidney disease: Secondary | ICD-10-CM | POA: Diagnosis not present

## 2024-11-07 DIAGNOSIS — Z86711 Personal history of pulmonary embolism: Secondary | ICD-10-CM | POA: Insufficient documentation

## 2024-11-07 DIAGNOSIS — I428 Other cardiomyopathies: Secondary | ICD-10-CM | POA: Insufficient documentation

## 2024-11-07 DIAGNOSIS — M19071 Primary osteoarthritis, right ankle and foot: Secondary | ICD-10-CM | POA: Diagnosis not present

## 2024-11-07 DIAGNOSIS — E1140 Type 2 diabetes mellitus with diabetic neuropathy, unspecified: Secondary | ICD-10-CM | POA: Diagnosis not present

## 2024-11-07 DIAGNOSIS — M79674 Pain in right toe(s): Secondary | ICD-10-CM | POA: Diagnosis not present

## 2024-11-07 DIAGNOSIS — R051 Acute cough: Secondary | ICD-10-CM | POA: Insufficient documentation

## 2024-11-07 DIAGNOSIS — R0602 Shortness of breath: Secondary | ICD-10-CM | POA: Diagnosis present

## 2024-11-07 DIAGNOSIS — Z950 Presence of cardiac pacemaker: Secondary | ICD-10-CM | POA: Diagnosis not present

## 2024-11-07 DIAGNOSIS — Z95810 Presence of automatic (implantable) cardiac defibrillator: Secondary | ICD-10-CM | POA: Insufficient documentation

## 2024-11-07 DIAGNOSIS — Z79899 Other long term (current) drug therapy: Secondary | ICD-10-CM | POA: Diagnosis not present

## 2024-11-07 DIAGNOSIS — X58XXXA Exposure to other specified factors, initial encounter: Secondary | ICD-10-CM | POA: Diagnosis not present

## 2024-11-07 DIAGNOSIS — I482 Chronic atrial fibrillation, unspecified: Secondary | ICD-10-CM | POA: Diagnosis not present

## 2024-11-07 DIAGNOSIS — K219 Gastro-esophageal reflux disease without esophagitis: Secondary | ICD-10-CM | POA: Insufficient documentation

## 2024-11-07 DIAGNOSIS — R0781 Pleurodynia: Secondary | ICD-10-CM | POA: Diagnosis not present

## 2024-11-07 DIAGNOSIS — R791 Abnormal coagulation profile: Secondary | ICD-10-CM | POA: Diagnosis not present

## 2024-11-07 DIAGNOSIS — I129 Hypertensive chronic kidney disease with stage 1 through stage 4 chronic kidney disease, or unspecified chronic kidney disease: Secondary | ICD-10-CM | POA: Diagnosis not present

## 2024-11-07 DIAGNOSIS — R7989 Other specified abnormal findings of blood chemistry: Secondary | ICD-10-CM | POA: Diagnosis not present

## 2024-11-07 DIAGNOSIS — S29012A Strain of muscle and tendon of back wall of thorax, initial encounter: Secondary | ICD-10-CM | POA: Diagnosis not present

## 2024-11-07 DIAGNOSIS — M546 Pain in thoracic spine: Secondary | ICD-10-CM | POA: Diagnosis present

## 2024-11-07 DIAGNOSIS — I517 Cardiomegaly: Secondary | ICD-10-CM | POA: Diagnosis not present

## 2024-11-07 DIAGNOSIS — R059 Cough, unspecified: Secondary | ICD-10-CM | POA: Diagnosis not present

## 2024-11-07 DIAGNOSIS — S161XXA Strain of muscle, fascia and tendon at neck level, initial encounter: Secondary | ICD-10-CM | POA: Diagnosis not present

## 2024-11-07 DIAGNOSIS — R0989 Other specified symptoms and signs involving the circulatory and respiratory systems: Secondary | ICD-10-CM | POA: Diagnosis not present

## 2024-11-07 DIAGNOSIS — Z7901 Long term (current) use of anticoagulants: Secondary | ICD-10-CM | POA: Diagnosis not present

## 2024-11-07 DIAGNOSIS — N189 Chronic kidney disease, unspecified: Secondary | ICD-10-CM | POA: Diagnosis not present

## 2024-11-07 LAB — CBC WITH DIFFERENTIAL/PLATELET
Abs Immature Granulocytes: 0.03 K/uL (ref 0.00–0.07)
Basophils Absolute: 0 K/uL (ref 0.0–0.1)
Basophils Relative: 0 %
Eosinophils Absolute: 0.2 K/uL (ref 0.0–0.5)
Eosinophils Relative: 2 %
HCT: 40.7 % (ref 39.0–52.0)
Hemoglobin: 13.8 g/dL (ref 13.0–17.0)
Immature Granulocytes: 0 %
Lymphocytes Relative: 33 %
Lymphs Abs: 2.3 K/uL (ref 0.7–4.0)
MCH: 26.7 pg (ref 26.0–34.0)
MCHC: 33.9 g/dL (ref 30.0–36.0)
MCV: 78.9 fL — ABNORMAL LOW (ref 80.0–100.0)
Monocytes Absolute: 0.8 K/uL (ref 0.1–1.0)
Monocytes Relative: 11 %
Neutro Abs: 3.7 K/uL (ref 1.7–7.7)
Neutrophils Relative %: 54 %
Platelets: 281 K/uL (ref 150–400)
RBC: 5.16 MIL/uL (ref 4.22–5.81)
RDW: 15.9 % — ABNORMAL HIGH (ref 11.5–15.5)
WBC: 7 K/uL (ref 4.0–10.5)
nRBC: 0 % (ref 0.0–0.2)

## 2024-11-07 LAB — COMPREHENSIVE METABOLIC PANEL WITH GFR
ALT: 15 U/L (ref 0–44)
AST: 15 U/L (ref 15–41)
Albumin: 3.1 g/dL — ABNORMAL LOW (ref 3.5–5.0)
Alkaline Phosphatase: 104 U/L (ref 38–126)
Anion gap: 10 (ref 5–15)
BUN: 17 mg/dL (ref 6–20)
CO2: 23 mmol/L (ref 22–32)
Calcium: 8.4 mg/dL — ABNORMAL LOW (ref 8.9–10.3)
Chloride: 103 mmol/L (ref 98–111)
Creatinine, Ser: 1.42 mg/dL — ABNORMAL HIGH (ref 0.61–1.24)
GFR, Estimated: 58 mL/min — ABNORMAL LOW (ref >60)
Glucose, Bld: 103 mg/dL — ABNORMAL HIGH (ref 70–99)
Potassium: 3.9 mmol/L (ref 3.5–5.1)
Sodium: 136 mmol/L (ref 135–145)
Total Bilirubin: 1.3 mg/dL — ABNORMAL HIGH (ref 0.0–1.2)
Total Protein: 7.5 g/dL (ref 6.5–8.1)

## 2024-11-07 LAB — RESP PANEL BY RT-PCR (RSV, FLU A&B, COVID)  RVPGX2
Influenza A by PCR: NEGATIVE
Influenza B by PCR: NEGATIVE
Resp Syncytial Virus by PCR: NEGATIVE
SARS Coronavirus 2 by RT PCR: NEGATIVE

## 2024-11-07 LAB — I-STAT VENOUS BLOOD GAS, ED
Acid-Base Excess: 2 mmol/L (ref 0.0–2.0)
Bicarbonate: 26.9 mmol/L (ref 20.0–28.0)
Calcium, Ion: 1.09 mmol/L — ABNORMAL LOW (ref 1.15–1.40)
HCT: 43 % (ref 39.0–52.0)
Hemoglobin: 14.6 g/dL (ref 13.0–17.0)
O2 Saturation: 80 %
Potassium: 3.9 mmol/L (ref 3.5–5.1)
Sodium: 139 mmol/L (ref 135–145)
TCO2: 28 mmol/L (ref 22–32)
pCO2, Ven: 41.4 mmHg — ABNORMAL LOW (ref 44–60)
pH, Ven: 7.421 (ref 7.25–7.43)
pO2, Ven: 44 mmHg (ref 32–45)

## 2024-11-07 LAB — BRAIN NATRIURETIC PEPTIDE: B Natriuretic Peptide: 310.2 pg/mL — ABNORMAL HIGH (ref 0.0–100.0)

## 2024-11-07 LAB — D-DIMER, QUANTITATIVE: D-Dimer, Quant: 0.76 ug{FEU}/mL — ABNORMAL HIGH (ref 0.00–0.50)

## 2024-11-07 LAB — TROPONIN I (HIGH SENSITIVITY)
Troponin I (High Sensitivity): 19 ng/L — ABNORMAL HIGH (ref <18)
Troponin I (High Sensitivity): 22 ng/L — ABNORMAL HIGH (ref <18)

## 2024-11-07 MED ORDER — IOHEXOL 350 MG/ML SOLN
75.0000 mL | Freq: Once | INTRAVENOUS | Status: AC | PRN
  Administered 2024-11-07: 75 mL via INTRAVENOUS

## 2024-11-07 MED ORDER — ASPIRIN 81 MG PO CHEW
324.0000 mg | CHEWABLE_TABLET | Freq: Once | ORAL | Status: AC
  Administered 2024-11-07: 324 mg via ORAL
  Filled 2024-11-07: qty 4

## 2024-11-07 MED ORDER — DOXYCYCLINE HYCLATE 100 MG PO TABS
100.0000 mg | ORAL_TABLET | Freq: Once | ORAL | Status: AC
  Administered 2024-11-07: 100 mg via ORAL
  Filled 2024-11-07: qty 1

## 2024-11-07 MED ORDER — LIDOCAINE 5 % EX PTCH
1.0000 | MEDICATED_PATCH | CUTANEOUS | Status: DC
  Administered 2024-11-07: 1 via TRANSDERMAL
  Filled 2024-11-07: qty 1

## 2024-11-07 MED ORDER — DOXYCYCLINE HYCLATE 100 MG PO CAPS: 100.0000 mg | ORAL_CAPSULE | Freq: Two times a day (BID) | ORAL | 0 refills | Status: DC

## 2024-11-07 MED ORDER — BENZONATATE 100 MG PO CAPS
100.0000 mg | ORAL_CAPSULE | Freq: Once | ORAL | Status: AC
  Administered 2024-11-07: 100 mg via ORAL
  Filled 2024-11-07: qty 1

## 2024-11-07 MED ORDER — CYCLOBENZAPRINE HCL 10 MG PO TABS
5.0000 mg | ORAL_TABLET | Freq: Once | ORAL | Status: AC
  Administered 2024-11-07: 5 mg via ORAL
  Filled 2024-11-07: qty 1

## 2024-11-07 MED ORDER — KETOROLAC TROMETHAMINE 15 MG/ML IJ SOLN
15.0000 mg | Freq: Once | INTRAMUSCULAR | Status: AC
  Administered 2024-11-07: 15 mg via INTRAVENOUS
  Filled 2024-11-07: qty 1

## 2024-11-07 MED ORDER — CYCLOBENZAPRINE HCL 10 MG PO TABS: 5.0000 mg | ORAL_TABLET | Freq: Two times a day (BID) | ORAL | 0 refills | Status: DC | PRN

## 2024-11-07 MED ORDER — BENZONATATE 100 MG PO CAPS: 100.0000 mg | ORAL_CAPSULE | Freq: Three times a day (TID) | ORAL | 0 refills | Status: DC

## 2024-11-07 NOTE — ED Provider Triage Note (Signed)
 Emergency Medicine Provider Triage Evaluation Note  [name] , a 58 y.o. male  was evaluated in triage.  Pt complains of chest pain, SOB, pleuritic left chest pain with respiration, recent double pneumonia, h/o pacer.  Review of Systems  Positive: As above Negative: fever  Physical Exam  There were no vitals taken for this visit. Gen:   Awake, no distress  dyspneic Resp:  No wheezing MSK:   Moves extremities without difficulty  Other:  Morbidly obese  Medical Decision Making  Medically screening exam initiated at 2:07 PM.  Appropriate orders placed.  [name] was informed that the remainder of the evaluation will be completed by another provider, this initial triage assessment does not replace that evaluation, and the importance of remaining in the ED until their evaluation is complete.     Adam Balls, PA-C 11/07/24 1408

## 2024-11-07 NOTE — ED Notes (Signed)
 CT

## 2024-11-07 NOTE — Discharge Instructions (Signed)
 [name] Silence  Thank you for allowing us  to take care of you today.  You came to the Emergency Department today because you had cough and shortness of breath, as well as a pain in your chest when you were taking a deep breath in.  Here in the emergency department your labs are looking well, you do not have any white blood cell count elevation, there is no evidence of a blood clot in your lungs.  There is no dense pneumonia.  You possibly have a viral infection, versus a atypical bacterial pneumonia.  Given you do have other medical risk factors, we are going to treat you with doxycycline  to treat any possible atypical bacterial pneumonia with doxycycline  twice a day for 10 days.  You are able to tolerate this medication while you were in the ED.  Otherwise we are also prescribing you Tessalon  Perles, you can take these every 8 hours as needed for cough, additionally you do have some muscular pain in your back as well as left-sided neck.  You can use Tylenol , ibuprofen, over-the-counter lidocaine  patches, as well as Flexeril  twice a day as needed for muscle pain and spasms.  Please do not drive after taking Flexeril  as it can make you sleepy.  To-Do: 1. Please follow-up with your primary doctor within 1 - 2 weeks / as soon as possible.   Please return to the Emergency Department or call 911 if you experience have worsening of your symptoms, or do not get better, new or different chest pain, shortness of breath, severe or significantly worsening pain, high fever, severe confusion, pass out or have any reason to think that you need emergency medical care.   We hope you feel better soon.   Mitzie Later, MD Department of Emergency Medicine [facility]

## 2024-11-07 NOTE — ED Triage Notes (Signed)
 Pt dx with pneumonia about 2 weeks ago, pt states symptoms resolved from that occurrence but just came back about an hour ago with chest pain, SOB, cough. Pt also has pacemaker and states he feels like his wires are pulling.

## 2024-11-07 NOTE — Telephone Encounter (Signed)
 FYI Only or Action Required?: FYI only for provider: ED advised.  Patient was last seen in primary care on 10/18/2024 by Joshua Debby CROME, MD.  Called Nurse Triage reporting Shortness of Breath.  Symptoms began several weeks ago.  Interventions attempted: Other: Seen at ED 2 times - Antibiotic given.  Symptoms are: gradually worsening.  Triage Disposition: Go to ED Now (Notify PCP)  Patient/caregiver understands and will follow disposition?: Yes                    Copied from CRM [id number]. Topic: Clinical - Red Word Triage >> Nov 07, 2024 11:03 AM Robinson H wrote: [facility] that prompted transfer to Nurse Triage: Patient calling for a refill for the albuterol  (VENTOLIN  HFA) 108 (90 Base) MCG/ACT inhaler and agent noticed patient breathing hard and sounding out of breath as he was talking. Patient states he's a little short of breath, side hurts when he breathes in, was diagnosed with Pneumonia in both lungs around Thanksgiving. Reason for Disposition  [1] MODERATE difficulty breathing (e.g., speaks in phrases, SOB even at rest, pulse 100-120) AND [2] NEW-onset or WORSE than normal  Answer Assessment - Initial Assessment Questions 1. RESPIRATORY STATUS: Describe your breathing? (e.g., wheezing, shortness of breath, unable to speak, severe coughing)      SOB -  2. ONSET: When did this breathing problem begin?      Thanksgiving 3. PATTERN Does the difficult breathing come and go, or has it been constant since it started?      Constant 4. SEVERITY: How bad is your breathing? (e.g., mild, moderate, severe)      Severe 5. RECURRENT SYMPTOM: Have you had difficulty breathing before? If Yes, ask: When was the last time? and What happened that time?      ongoin 6. CARDIAC HISTORY: Do you have any history of heart disease? (e.g., heart attack, angina, bypass surgery, angioplasty)      Yes - afib 7. LUNG HISTORY: Do you have any history of lung disease?   (e.g., pulmonary embolus, asthma, emphysema)     yes 8. CAUSE: What do you think is causing the breathing problem?      Pneumonia 9. OTHER SYMPTOMS: Do you have any other symptoms? (e.g., chest pain, cough, dizziness, fever, runny nose)     Chest pain  Protocols used: Breathing Difficulty-A-AH

## 2024-11-07 NOTE — ED Provider Notes (Signed)
 [place] EMERGENC[facility] DEPARTMENT AT [facility] Provider Note   CSN: [id number] Arrival date & time: 11/07/24  1328     History Chief Complaint  Patient presents with   Shortness of Breath   Cough   Chest Pain    HPI: [name] is a 58 y.o. male with history pertinent for elevated BMI, OSA, HTN, A-fib on Xarelto , GERD, T2DM, nonischemic cardiomyopathy, prior PE, CKD, diabetic neuropathy, ICD who presents complaining of multiple symptoms. Patient arrived via POV.  History provided by patient.  No interpreter required during this encounter.  Patient reports that he recently was diagnosed with pneumonia and completed a course of azithromycin .  Reports that he initially had improvement, however feels that he has had recurrence of his symptoms over the past 24 hours.  Reports that he has left pleuritic chest pain as well as a pulling sensation.  Reports that he has sensation of muscle spasms in his upper back as well as left-sided neck as if I slept on my neck wrong.  Reports that he also has a pulling sensation across his left-sided chest wall, denies that this is a sensation of his ICD wires pulling on my evaluation.  Denies any substernal chest pressure.  Does report that he also has productive cough over the past 48 hours.  Denies any known history of asthma or COPD, however reports that he previously has been prescribed albuterol  for unclear reasons, therefore he reports that he called his PCPs office yesterday and requested albuterol , however was told that he needed to go to the emergency department for further evaluation.  Denies fever, chills, endorses sore throat, endorses congestion and rhinorrhea, denies ear pain, endorses shortness of breath, pleuritic chest pain, denies substernal chest pain, endorses nausea, denies vomiting, abdominal pain, diarrhea.  Also reports that he has had atraumatic pain of his right third toe.  Patient's recorded medical, surgical, social,  medication list and allergies were reviewed in the Snapshot window as part of the initial history.   Prior to Admission medications   Medication Sig Start Date End Date Taking? Authorizing Provider  benzonatate  (TESSALON ) 100 MG capsule Take 1 capsule (100 mg total) by mouth every 8 (eight) hours. 11/07/24  Yes Rogelia Jerilynn RAMAN, MD  cyclobenzaprine  (FLEXERIL ) 10 MG tablet Take 0.5 tablets (5 mg total) by mouth 2 (two) times daily as needed for muscle spasms. 11/07/24  Yes Rogelia Jerilynn RAMAN, MD  doxycycline  (VIBRAMYCIN ) 100 MG capsule Take 1 capsule (100 mg total) by mouth 2 (two) times daily. 11/07/24  Yes Rogelia Jerilynn RAMAN, MD  albuterol  (VENTOLIN  HFA) 108 [phone] Base) MCG/ACT inhaler INHALE 1-2 PUFFS INTO THE LUNGS EVERY 4 (FOUR) HOURS AS NEEDED FOR SHORTNESS OF BREATH OR WHEEZING. 06/04/22   Rolan Ezra RAMAN, MD  amiodarone  (PACERONE ) 200 MG tablet Take 0.5 tablets (100 mg total) by mouth daily. PLEASE SCHEDULE FOLLOW UP FOR MORE RERFILLS 08/09/24   Rolan Ezra RAMAN, MD  atorvastatin  (LIPITOR) 40 MG tablet TAKE 1 TABLET BY MOUTH EVERY DAY 09/30/24   Milford, Jessica M, FNP  carvedilol  (COREG ) 6.25 MG tablet Take 1 tablet (6.25 mg total) by mouth 2 (two) times daily. Patient taking differently: Take 6.25 mg by mouth daily at 6 (six) AM. 06/16/23   Milford, Harlene HERO, FNP  dapagliflozin  propanediol (FARXIGA ) 10 MG TABS tablet Take 1 tablet (10 mg total) by mouth daily. 10/05/24   Rolan Ezra RAMAN, MD  gabapentin  (NEURONTIN ) 300 MG capsule Take 300 mg by mouth 3 (three) times  daily. 09/02/24   [provider]  HYDROcodone -acetaminophen  (NORCO/VICODIN) 5-325 MG tablet Take 1 tablet by mouth every 4 (four) hours as needed. 10/29/24 10/29/25  Sofia, Leslie K, PA-C  methimazole  (TAPAZOLE ) 5 MG tablet TAKE 1 TABLET (5 MG TOTAL) BY MOUTH DAILY. 09/19/24   Komal, Motwani, MD  pantoprazole  (PROTONIX ) 40 MG tablet TAKE 1 TABLET (40 MG TOTAL) BY MOUTH DAILY. SCHEDULE AN APPT FOR FURTHER REFILLS 08/25/24    Joshua Debby CROME, MD  potassium chloride  SA (KLOR-CON  M) 20 MEQ tablet TAKE 2 TABLETS BY MOUTH DAILY 08/10/24   Milford, Harlene HERO, FNP  PRESCRIPTION MEDICATION Inhale into the lungs See admin instructions. Bipap, pressure 16/12 with 2L of O2 - use whenever sleeping    [provider]  sildenafil  (VIAGRA ) 50 MG tablet TAKE 1 TABLET BY MOUTH AS NEEDED FOR ERECTILE DYSFUNCTION BEGIN  WITH  1  TABLET,  MAY  TAKE  2  IF  NECESSARY 10/25/24   Rolan Ezra RAMAN, MD  spironolactone  (ALDACTONE ) 25 MG tablet TAKE 1 TABLET BY MOUTH EVERYDAY AT BEDTIME 08/15/24   Colletta Manuelita Garre, PA-C  tirzepatide  (MOUNJARO ) 2.5 MG/0.5ML Pen Inject 2.5 mg into the skin once a week. 10/18/24   Joshua Debby CROME, MD  torsemide  (DEMADEX ) 20 MG tablet Take 2 tablets (40 mg total) by mouth every morning 02/22/24   McLean, Dalton S, MD  traMADol  (ULTRAM -ER) 100 MG 24 hr tablet Take 1 tablet (100 mg total) by mouth daily. 10/18/24   Joshua Debby CROME, MD  XARELTO  20 MG TABS tablet Take 1 tablet (20 mg total) by mouth daily with supper. 10/18/24   Joshua Debby CROME, MD  pravastatin  (PRAVACHOL ) 40 MG tablet Take 40 mg by mouth daily.  02/20/12  [provider]     Allergies: Ace inhibitors, Isosorbide , Buspirone, Doxycycline , Bidil [isosorb dinitrate-hydralazine ], Digoxin , Digoxin  and related, and Isosorbide  dinitrate   Review of Systems   ROS as per HPI  Physical Exam Updated Vital Signs BP 101/70 (BP Location: Right Arm)   Pulse 76   Temp 98.3 F (36.8 C) (Oral)   Resp 16   Ht 5' 9 (1.753 m)   Wt (!) 154.2 kg   SpO2 94%   BMI 50.21 kg/m  Physical Exam Vitals and nursing note reviewed.  Constitutional:      General: He is not in acute distress.    Appearance: He is well-developed.  HENT:     Head: Normocephalic and atraumatic.     Right Ear: Tympanic membrane normal. No mastoid tenderness.     Left Ear: Tympanic membrane normal. No mastoid tenderness.     Mouth/Throat:     Mouth: Mucous membranes are  moist.     Pharynx: Oropharynx is clear. No pharyngeal swelling or uvula swelling.     Tonsils: No tonsillar exudate or tonsillar abscesses.  Eyes:     Conjunctiva/sclera: Conjunctivae normal.  Cardiovascular:     Rate and Rhythm: Normal rate and regular rhythm.     Heart sounds: No murmur heard. Pulmonary:     Effort: Pulmonary effort is normal. No respiratory distress.     Breath sounds: Normal breath sounds.  Abdominal:     Palpations: Abdomen is soft.     Tenderness: There is no abdominal tenderness.  Musculoskeletal:        General: No swelling.     Cervical back: Neck supple.     Comments: Pain to palpation of the right third toe without erythema, swelling, deformity, normal capillary refill  and sensation intact to light touch of all toes of the right foot  Skin:    General: Skin is warm and dry.     Capillary Refill: Capillary refill takes less than 2 seconds.  Neurological:     Mental Status: He is alert.  Psychiatric:        Mood and Affect: Mood normal.     ED Course/ Medical Decision Making/ A&P    Procedures Procedures   Medications Ordered in ED Medications  lidocaine  (LIDODERM ) 5 % 1 patch (1 patch Transdermal Patch Applied 11/07/24 1551)  ketorolac  (TORADOL ) 15 MG/ML injection 15 mg (15 mg Intravenous Given 11/07/24 1550)  cyclobenzaprine  (FLEXERIL ) tablet 5 mg (5 mg Oral Given 11/07/24 1551)  aspirin  chewable tablet 324 mg (324 mg Oral Given 11/07/24 1551)  benzonatate  (TESSALON ) capsule 100 mg (100 mg Oral Given 11/07/24 1551)  iohexol  (OMNIPAQUE ) 350 MG/ML injection 75 mL (75 mLs Intravenous Contrast Given 11/07/24 1836)  doxycycline  (VIBRA -TABS) tablet 100 mg (100 mg Oral Given 11/07/24 2000)    Medical Decision Making:   HILLARY SCHWEGLER is a 58 y.o. male who presents for multiple symptoms as per above.  Physical exam is pertinent for newness to palpation of the right third toe, otherwise no focal abnormalities on pulmonary exam, oropharyngeal exam.   The  differential includes but is not limited to viral infection, pneumonia, pneumonitis, bronchitis, hypercarbic respiratory failure, ACS, muscular strain, pulmonary embolism, fracture, dislocation, COVID toes.  Independent historian: None  External data reviewed: Notes: Reviewed recent prior ED notes, patient was placed on azithromycin  on 11/27, also has telephone nurse triage note from yesterday  Initial Plan:  Screening labs including CBC and Metabolic panel to evaluate for infectious or metabolic etiology of disease.  Screening BNP to evaluate volume status Screening D-dimer in the setting of pleuritic chest pain, history of prior PE VBG to evaluate for hypercarbic respiratory failure RVP in the setting of multiple URI symptoms (sore throat, cough, congestion) Chest x-ray to evaluate for structural/infectious intra-thoracic pathology.  EKG and serial troponin to evaluate for cardiac pathology. Objective evaluation as below reviewed   Labs: Ordered, Independent interpretation, and Details: Initial troponin 19, delta flat at 22.  D-dimer elevated at 0.76.  BNP mildly elevated at 300.  CBC without leukocytosis, anemia, thrombocytopenia.  CMP with creatinine elevated at baseline, no AKI, emergent electrolyte derangement or emergent LFT abnormality.  COVID/flu/RSV negative.  VBG without acidosis or hypercarbia  Radiology: Ordered, Independent interpretation, Details: Chest x-ray with cardiomegaly, no focal airspace opacification, cardiomediastinal switcher Intran, pneumothorax, pleural effusion, bony derangement.  CT PE study without large central PE or focal parenchymal changes, and All images reviewed independently.  Agree with radiology report at this time.   CT Angio Chest PE W and/or Wo Contrast Result Date: 11/07/2024 CLINICAL DATA:  Pleuritic left chest pain and shortness of breath EXAM: CT ANGIOGRAPHY CHEST WITH CONTRAST TECHNIQUE: Multidetector CT imaging of the chest was performed using the  standard protocol during bolus administration of intravenous contrast. Multiplanar CT image reconstructions and MIPs were obtained to evaluate the vascular anatomy. RADIATION DOSE REDUCTION: This exam was performed according to the departmental dose-optimization program which includes automated exposure control, adjustment of the mA and/or kV according to patient size and/or use of iterative reconstruction technique. CONTRAST:  75mL OMNIPAQUE  IOHEXOL  350 MG/ML SOLN COMPARISON:  Same day chest radiograph, CTA chest dated 10/27/2024 FINDINGS: Cardiovascular: Left chest wall ICD leads terminate in the right atrium, ventricle, and tributary of the coronary sinus. The  study is adequate for the evaluation of pulmonary embolism to the level of subsegmental pulmonary arteries in the upper lobes and proximal segmental pulmonary arteries in the lower lobes due to motion artifact. There are no filling defects in the central, lobar, segmental or visualized subsegmental pulmonary artery branches to suggest acute pulmonary embolism. Dilated main pulmonary artery measures 4.5 cm. Multichamber cardiomegaly. No significant pericardial fluid/thickening. Mediastinum/Nodes: Imaged thyroid  gland without nodules meeting criteria for imaging follow-up by size. Normal esophagus. Unchanged multi station mediastinal and hilar lymphadenopathy, for example 16 mm precarinal (5:52) and 11 mm left hilar (5:66). Lungs/Pleura: The central airways are patent. Mild diffuse bronchial wall thickening. Mild interlobular septal thickening. Bilateral peribronchovascular ground-glass opacities. No pneumothorax. Trace bilateral pleural effusions. Upper abdomen: Cholelithiasis. 14 mm left adrenal nodule measures -3 HU (5:143), unchanged, likely adenoma. No specific follow-up imaging recommended. No right adrenal nodule. Musculoskeletal: No acute or abnormal lytic or blastic osseous lesions. Flowing anterior osteophytes over at least 4 contiguous thoracic  vertebrae which can be seen in the setting of diffuse idiopathic skeletal hyperostosis (DISH). Review of the MIP images confirms the above findings. IMPRESSION: 1. No evidence of acute pulmonary embolism to the level of the subsegmental pulmonary arteries in the upper lobes and proximal segmental pulmonary arteries in the lower lobes due to motion artifact. 2. Multichamber cardiomegaly with mild pulmonary edema and trace bilateral pleural effusions. 3. Bilateral peribronchovascular ground-glass opacities, which may represent pulmonary edema or atypical infection. 4. Unchanged multi station mediastinal and hilar lymphadenopathy, likely reactive. 5. Dilated main pulmonary artery, which can be seen in the setting of pulmonary arterial hypertension. 6. Cholelithiasis. Electronically Signed   By: Limin  Xu M.D.   On: 11/07/2024 19:10   DG Foot 2 Views Right Result Date: 11/07/2024 CLINICAL DATA:  Tenderness to palpation of the third toe EXAM: RIGHT FOOT - 2 VIEW COMPARISON:  None Available. FINDINGS: There is no evidence of fracture or dislocation. Diffuse degenerative changes of the foot. Osteophyte formation, most pronounced at the calcaneocuboid joint. Soft tissues are unremarkable. IMPRESSION: 1. No acute fracture or dislocation. Specifically, no fracture or dislocation of the third toe. 2. Diffuse degenerative changes of the foot. Electronically Signed   By: Limin  Xu M.D.   On: 11/07/2024 16:07   DG Chest 2 View Result Date: 11/07/2024 CLINICAL DATA:  Pleuritic left chest pain. EXAM: CHEST - 2 VIEW COMPARISON:  10/29/2024 FINDINGS: Stable enlarged cardiac silhouette and left subclavian pacer and AICD leads. The pulmonary vasculature remains mildly prominent. Clear lungs. No pleural fluid seen. Thoracic spine degenerative changes. IMPRESSION: 1. No acute abnormality. 2. Stable cardiomegaly and mild pulmonary vascular congestion. Electronically Signed   By: Elspeth Bathe M.D.   On: 11/07/2024 15:19   DG Chest  2 View Result Date: 10/29/2024 CLINICAL DATA:  Pneumonia EXAM: CHEST - 2 VIEW COMPARISON:  Prior chest x-ray 10/27/2024 FINDINGS: Stable appearance of biventricular cardiac rhythm maintenance device via the left subclavian vein. Stable cardiomegaly. Mild vascular congestion without overt edema. Mild patchy left lower lobe airspace opacity may reflect atelectasis or pneumonia. IMPRESSION: 1. Mild left lower lobe patchy airspace opacity may reflect atelectasis or pneumonia. 2. Stable cardiomegaly with left subclavian approach biventricular cardiac rhythm maintenance device in place. 3. Mild pulmonary vascular congestion without overt edema. Electronically Signed   By: Wilkie Lent M.D.   On: 10/29/2024 09:46   CT Angio Chest/Abd/Pel for Dissection W and/or Wo Contrast Result Date: 10/27/2024 EXAM: CTA CHEST, ABDOMEN AND PELVIS WITH AND WITHOUT CONTRAST 10/27/2024  08:20:19 PM TECHNIQUE: CTA of the chest was performed with and without the administration of intravenous contrast. CTA of the abdomen and pelvis was performed with the administration of intravenous contrast. Multiplanar reformatted images are provided for review. MIP images are provided for review. Automated exposure control, iterative reconstruction, and/or weight based adjustment of the mA/kV was utilized to reduce the radiation dose to as low as reasonably achievable. COMPARISON: Portable chest today, PA lat chest 09/19/2024, chest, abdomen and pelvis CT without contrast 10/02/2023, and CTA chest 01/14/2022. CLINICAL HISTORY: Shortness of breath and chest pain onset 30 minutes ago, left chest radiating to the left shoulder, left basilar infiltrates were suspected on the portable chest today. FINDINGS: VASCULATURE: AORTA: The aorta is normal in course and caliber. The thoracic aorta is plaque-free. There is minimal scattered calcific plaque in the abdominal aorta without aneurysm, stenosis, or dissection. PULMONARY ARTERIES: There is an enlarged  pulmonary trunk, 4.0 cm indicating arterial hypertension, previously 3.3 cm. No central embolism is seen, but opacification of pulmonary arteries does not extend beyond the lobar main arteries due to the systemic arterial technique. GREAT VESSELS OF AORTIC ARCH: The great vessels are clear. No acute finding. No dissection. No arterial occlusion or significant stenosis. CELIAC TRUNK: The celiac artery is widely patent. There is a separate, also widely patent aortic origin for the left gastric artery, and early celiac branching of the common hepatic artery. No occlusion or significant stenosis. SUPERIOR MESENTERIC ARTERY: The SMA is widely patent. There is a replaced widely patent right hepatic artery arising from it. SMA branch opacification is not well depicted due to beam hardening owing to patient body habitus. No occlusion or significant stenosis. INFERIOR MESENTERIC ARTERY: No acute finding. No occlusion or significant stenosis. RENAL ARTERIES: There is a single widely patent right renal artery. On the left, a small-caliber artery arises first and supplies a portion of the upper pole of the kidney. The main renal artery arises second. Both vessels are widely patent. No occlusion or significant stenosis. ILIAC ARTERIES: There are mild patchy calcific plaques in the iliac arteries, with tortuous but clear common iliac, internal and external iliac arteries. Generalized borderline aneurysmal ectasia is seen in both common iliac arteries, up to 1.7 cm on the right, 1.9 cm on the left with asymmetrically ectatic left internal iliac artery measuring 1.5 cm, also borderline aneurysmal. No occlusion or significant stenosis. CHEST: MEDIASTINUM: Metal artifact again noted due to a left chest dual lead pacing system with separate aid wire through the coronary sinus. Central pulmonary veins are prominent, similar to the 2023 study but increased since 2024. The heart is moderately enlarged with left chamber predominance and  dilated left ventricle, again similar to 2023 but increased from 2024. No visible coronary artery calcification. There is increased prominence of mediastinal lymph nodes, up to 1.7 cm short axis in the right paratracheal chain, 1.5 cm in the subcarinal space, with bilateral scattered hilar lymph nodes up to 1.3 cm. There is an AP window node measuring 1.8 cm (series 11, axial 54). No bulky or encasing adenopathy is seen. The esophagus is unremarkable. LUNGS AND PLEURA: Lung apices again show mild paraseptal emphysematous change. There is mild diffuse bronchial thickening. There are patchy hazy opacities in both lower lobes which could be due to pneumonitis, mosaicism related to air trapping, or combination. The upper and right middle lobes are clear of infiltrates. Mildly There is mild chronic subpleural reticulation in the upper lobes. No pleural effusion or overt edema. Central airways  and trachea are clear. No pneumothorax. THORACIC BONES AND SOFT TISSUES: Thoracic spine again with extensive bridging enthesopathy. No acute or significant thoracic osseous finding. There is discoid subareolar gynecomastia bilaterally. ABDOMEN AND PELVIS: LIMITATIONS/ARTIFACTS: There is loss of fine detail in the abdomen and pelvis due to beam hardening and body habitus. The images are grainy. LIVER: The liver is 20 cm in length with moderate steatosis without evidence of mass. GALLBLADDER AND BILE DUCTS: There is a 1.6 cm single rim-calcified stone in the proximal gallbladder without overt wall thickening. No biliary ductal dilatation. SPLEEN: The spleen is unremarkable. PANCREAS: The pancreas is unremarkable. ADRENAL GLANDS: There is a 1.8 cm stable left adrenal adenoma, Hounsfield density is 8. There is no right adrenal mass. KIDNEYS, URETERS AND BLADDER: No renal mass. There is a 1.5 cm Bosniak 1 cyst anteriorly in the right kidney, Hounsfield density is 19. No follow-up imaging is recommended. No stones in the kidneys or  ureters. No hydronephrosis. No perinephric or periureteral stranding. Urinary bladder is unremarkable. GI AND BOWEL: Stomach and duodenal sweep demonstrate no acute abnormality. There is a normal caliber appendix. Scattered uncomplicated colonic diverticulosis. No bowel obstruction or inflammation. No abnormal bowel wall thickening or distension. REPRODUCTIVE: Enlarged prostate is seen measuring 4.8 cm. PERITONEUM AND RETROPERITONEUM: No ascites or free air. LYMPH NODES: No lymphadenopathy. ABDOMINAL BONES AND SOFT TISSUES: Degenerative spondylosis and facet hypertrophy of the lumbar spine. Mild hip arthrosis is noted with ankylosis over both SI joints. Acquired spinal stenosis at L4-L5. There are small umbilical and inguinal fat hernias. No acute soft tissue abnormality. IMPRESSION: 1. No evidence of acute aortic syndrome. Only minimal atherosclerosis is seen, in the distal abdominal aorta. 2. Enlarged pulmonary trunk measuring 4.0 cm, increased from 3.3 cm, compatible with pulmonary arterial hypertension; no central pulmonary embolism is identified. 3. Moderate cardiomegaly with left-sided chamber predominance, central venous distention and dilated left ventricle, similar to 2023 and increased from 2024. 4. Patchy hazy opacities in both lower lobes that may reflect pneumonitis versus mosaic attenuation from air trapping, or both. Mild diffuse bronchial thickening. 5. Mediastinal lymphadenopathy with nodes up to 1.8 cm in the aortopulmonary window without bulky or encasing disease. 3 month follow up CT or PET CT recommended. 6. Enlarged prostate. 7. Steatosis of the liver . Cholelithiasis. 8. Atherosclerosis and tortuosity with borderline aneurysmal common iliac arteries and left internal iliac artery. No stenosis or dissection. 9. Acquired spinal stenosis L4-L5. [name] Electronically signed by: Francis Quam MD 10/27/2024 09:12 PM EST RP Workstation: [id number]   DG Chest Portable 1 View Result Date:  10/27/2024 CLINICAL DATA:  Shortness of breath and chest pain EXAM: PORTABLE CHEST 1 VIEW COMPARISON:  Chest radiograph dated 09/19/2024 FINDINGS: Left chest wall ICD leads project over the right atrium and ventricle and tributary of the coronary sinus. Normal lung volumes. Right basilar and left retrocardiac patchy opacities. No pleural effusion or pneumothorax. Similar enlarged cardiomediastinal silhouette. No acute osseous abnormality. IMPRESSION: 1. Right basilar and left retrocardiac patchy opacities, which may represent atelectasis, aspiration, or pneumonia. 2. Similar cardiomegaly. Electronically Signed   By: Limin  Xu M.D.   On: 10/27/2024 18:35   CUP PACEART REMOTE DEVICE CHECK Result Date: 10/25/2024 ICD Scheduled remote reviewed. Normal device function.  Presenting rhythm: AS/BiV pace HF diagnostics have been abnormal this remote cycle, followed by ICM Next remote transmission per protocol. LA, CVRS  US  THYROID  Result Date: 10/22/2024 CLINICAL DATA:  Other. Thyroid  nodule evaluation. History of hyperthyroidism and chronic amiodarone  therapy. EXAM: THYROID  ULTRASOUND  TECHNIQUE: Ultrasound examination of the thyroid  gland and adjacent soft tissues was performed. COMPARISON:  Nuclear medicine thyroid  scan 04/08/2024 FINDINGS: Parenchymal Echotexture: Mildly heterogenous Isthmus: 1 cm Right lobe: 3.8 x 1.4 x 1.8 cm Left lobe: 3.7 x 1.7 x 1.7 cm _________________________________________________________ Estimated total number of nodules >/= 1 cm: 1 Number of spongiform nodules >/=  2 cm not described below (TR1): 0 Number of mixed cystic and solid nodules >/= 1.5 cm not described below (TR2): 0 _________________________________________________________ Nodule # 1: Location: Right; Superior Maximum size: 1.6 cm; Other 2 dimensions: 1.3 x 0.9 cm Composition: solid/almost completely solid (2) Echogenicity: isoechoic (1) Shape: not taller-than-wide (0) Margins: smooth (0) Echogenic foci: none (0) ACR  TI-RADS total points: 3. ACR TI-RADS risk category: TR3 (3 points). ACR TI-RADS recommendations: *Given size (>/= 1.5 - 2.4 cm) and appearance, a follow-up ultrasound in 1 year should be considered based on TI-RADS criteria. _________________________________________________________ No enlarged or abnormal appearing lymph nodes are identified. IMPRESSION: 1.6 cm right superior thyroid  nodule (nodule 1) meets criteria for 1 year follow-up ultrasound. Electronically Signed   By: Marcey Moan M.D.   On: 10/22/2024 11:43    EKG/Medicine tests: Ordered, Independent interpretation, and Details: Rate 73, AV dual placed rhythm, no significant ST elevations or depression               Interventions: Lidocaine  patch, Toradol , Flexeril , Tessalon  Perles, aspirin   See the EMR for full details regarding lab and imaging results.  Patient presents to the emergency department with multiple different complaints including pleuritic chest pain, shortness of breath, productive cough, and muscle soreness/spasms.  Patient does have muscular tenderness to palpation on exam, however also has multiple chronic medical conditions that require broad lab workup as per above.  Patient treated with multimodal pain therapy, and additionally aspirin  was administered given patient had mildly elevated initial troponin of 19, however delta is stable and reassuring at 22.  Doubt ACS no acidosis or hypercarbia on VBG, therefore doubt COPD exacerbation, patient is negative for COVID, flu, RSV.  Lack of leukocytosis makes infectious etiology such as pneumonia unlikely.  BNP only mildly elevated, thus doubt heart failure exacerbation.  Patient does have elevation of D-dimer, given patient's pleuritic component of chest pain, and history of prior PE, do feel that patient warrants CT PE study.  This was obtained and demonstrates possible bibasilar patchy opacifications concerning for developing atypical pneumonia, however no dense consolidation, no  pulmonary embolism.  Patient remained hemodynamically stable and saturating well on room air throughout his time in the ED, on reevaluation has improvement in his symptoms.  Discussed reassuring workup, discussed that he likely has a viral etiology of symptoms given his multisystem symptoms including sore throat, congestion, rhinorrhea, cough, however given his workup is reassuring, however, given multiple comorbid conditions, possible atypical pneumonia on CT, do feel that benefits of course of doxycycline  outweigh risks.  Patient is documented as having a doxycycline  allergy, however description of allergies not provided in chart, patient does not believe that he has an allergy to doxycycline .  First dose given in the ED, patient was able to tolerate without difficulty and did not have any symptoms with 20 minutes of monitoring, therefore do feel that patient is appropriate for discharge with course of doxycycline .  Patient does report that he has close follow-up scheduled with his pulmonologist on 12/15.  Presentation is most consistent with acute complicated illness, Current presentation is complicated by underlying chronic conditions, and I did consider and rule out  acute life/limb-threatening illness  Discussion of management or test interpretations with external provider(s): Not indicated  Risk Drugs:OTC drugs and Prescription drug management  Disposition: DISCHARGE: I believe that the patient is safe for discharge home with outpatient follow-up. Patient was informed of all pertinent physical exam, laboratory, and imaging findings. Patient's suspected etiology of their symptom presentation was discussed with the patient and all questions were answered. We discussed following up with PCP, pulmonologist. I provided thorough ED return precautions. The patient feels safe and comfortable with this plan.  MDM generated using voice dictation software and may contain dictation errors.  Please contact me  for any clarification or with any questions.  Clinical Impression:  1. Acute cough   2. Shortness of breath   3. Muscle strain   4. Atypical pneumonia      Discharge   Final Clinical Impression(s) / ED Diagnoses Final diagnoses:  Acute cough  Shortness of breath  Muscle strain  Atypical pneumonia    Rx / DC Orders ED Discharge Orders          Ordered    benzonatate  (TESSALON ) 100 MG capsule  Every 8 hours        11/07/24 2016    cyclobenzaprine  (FLEXERIL ) 10 MG tablet  2 times daily PRN        11/07/24 2016    doxycycline  (VIBRAMYCIN ) 100 MG capsule  2 times daily        11/07/24 2016             Rogelia Jerilynn RAMAN, MD 11/07/24 2021

## 2024-11-07 NOTE — ED Notes (Signed)
 Patient transported to CT

## 2024-11-09 ENCOUNTER — Telehealth: Admitting: Psychiatry

## 2024-11-09 ENCOUNTER — Encounter: Payer: Self-pay | Admitting: Psychiatry

## 2024-11-09 DIAGNOSIS — F4310 Post-traumatic stress disorder, unspecified: Secondary | ICD-10-CM

## 2024-11-09 DIAGNOSIS — F3175 Bipolar disorder, in partial remission, most recent episode depressed: Secondary | ICD-10-CM | POA: Diagnosis not present

## 2024-11-09 DIAGNOSIS — F410 Panic disorder [episodic paroxysmal anxiety] without agoraphobia: Secondary | ICD-10-CM | POA: Diagnosis not present

## 2024-11-09 DIAGNOSIS — F1021 Alcohol dependence, in remission: Secondary | ICD-10-CM | POA: Diagnosis not present

## 2024-11-09 DIAGNOSIS — G4700 Insomnia, unspecified: Secondary | ICD-10-CM | POA: Diagnosis not present

## 2024-11-09 MED ORDER — DAYVIGO 5 MG PO TABS: 5.0000 mg | ORAL_TABLET | Freq: Every evening | ORAL | 1 refills | Status: DC | PRN

## 2024-11-14 ENCOUNTER — Ambulatory Visit: Attending: Cardiology

## 2024-11-14 ENCOUNTER — Ambulatory Visit: Admitting: Sleep Medicine

## 2024-11-14 ENCOUNTER — Encounter: Payer: Self-pay | Admitting: Sleep Medicine

## 2024-11-14 VITALS — BP 120/88 | HR 78 | Temp 98.0°F | Ht 69.0 in | Wt 351.8 lb

## 2024-11-14 DIAGNOSIS — I10 Essential (primary) hypertension: Secondary | ICD-10-CM

## 2024-11-14 DIAGNOSIS — G4700 Insomnia, unspecified: Secondary | ICD-10-CM | POA: Diagnosis not present

## 2024-11-14 DIAGNOSIS — I5022 Chronic systolic (congestive) heart failure: Secondary | ICD-10-CM | POA: Diagnosis not present

## 2024-11-14 DIAGNOSIS — Z95810 Presence of automatic (implantable) cardiac defibrillator: Secondary | ICD-10-CM | POA: Diagnosis not present

## 2024-11-14 DIAGNOSIS — G4733 Obstructive sleep apnea (adult) (pediatric): Secondary | ICD-10-CM | POA: Diagnosis not present

## 2024-11-14 DIAGNOSIS — F17210 Nicotine dependence, cigarettes, uncomplicated: Secondary | ICD-10-CM | POA: Diagnosis not present

## 2024-11-14 MED ORDER — TRAZODONE HCL 50 MG PO TABS: 50.0000 mg | ORAL_TABLET | Freq: Every day | ORAL | 3 refills | Status: DC

## 2024-11-14 NOTE — Progress Notes (Signed)
 EPIC Encounter for ICM Monitoring  Patient Name: [name] is a 58 y.o. male Date: 11/14/2024 Primary Care Physican: Adam Debby LITTIE, MD Primary Cardiologist: [name] Electrophysiologist: [name]: 96% 02/04/2023 Weight:  358 lbs       06/01/2023 Weight: 328 lbs (losing weight taking Mounjaro )   06/09/2023 Weight: 328 lbs             07/07/2023 Weight: 308 lbs   09/16/2023 Weight: 317 lbs    02/22/2024 Office Weight: 330 lbs 03/30/2024 Weight: 332 lbs (was up to 336 lbs) 04/01/2024 Weight:  331 lbs 07/04/2024 Office Weight: 344 lbs 09/20/2024 Office Weight: 349 lbs 10/07/2024 Weight: 345 lbs   Spoke with patient and heart failure questions reviewed.  Transmission results reviewed.  Pt reports swelling in feet and leg weakness since 11/07/2024 ED visit.  He has missed most days since fluid accumulation started due to leg weakness and unable to get the bathroom quick enough.  He is a little better today and will take the Torsemide  today.  He has not been sleeping either and has had some med changes.  3 ED visit between 10/27/2024-11/07/2024.   Diet:  Drinks a lot of fluids.     Since 10/07/2024 ICM Remote Transmission:  CorVue thoracic impedance suggesting possible fluid accumulation starting 11/12/2024.  Also suggesting possible fluid accumulation from 10/28/2024-11/10/2024.     [name]   Prescribed:  Torsemide  20 mg Take 2 tablet(s) (40 mg total) every morning.  11/11/2024 Has not been compliant with Torsemide  since 10/28/2024 due to difficulty ambulating to the bathroom.   Potassium 20 mEq take 2 tablet(s) (40 mEq total) by mouth daily.   Spironolactone  25 mg take 1 tablet daily   Labs: 11/07/2024 Potassium 3.9, Sodium 139 (4:07 PM) 11/07/2024 Creatinine 1.42, BUN 17, Potassium 3.9, Sodium 136, GFR 58 (2:26 PM) 10/27/2024 Creatinine 1.59, BUN 20, Potassium 4.9, Sodium 136, GFR 51 09/20/2024 Creatinine 1.25, BUN 15, Potassium 4.1, Sodium 140, GFR 68 09/19/2024 Creatinine 1.17, BUN 12,  Potassium 3.6, Sodium 136 06/30/2024 Creatinine 1.45, BUN 18, Potassium 4.4, Sodium 138, GFR 57 05/21/2024 Creatinine 1.33, BUN 19, Potassium 4.1, Sodium 135, GFR >60 02/22/2024 Creatinine 1.77, BUN 27, Potassium 3.7, Sodium 136, GFR 45 A complete set of results can be found in Results Review.   Recommendations:  He plans on resuming prescribed Torsemide  dosage as long as he is able to ambulate safely to the bathroom.    Follow-up plan: ICM clinic phone appointment 11/22/2024 to recheck fluid levels.   91 day device clinic remote transmission 01/25/2024.     EP/Cardiology Office Visits:  12/14/2024 with Dr [name].   Recall 09/15/2025 with Charlies Arthur, PA.   Copy of ICM check sent to Dr. Kennyth.  Remote monitoring is medically necessary for Heart Failure Management.    Daily Thoracic Impedance ICM trend: 08/16/2024 through 11/14/2024.    12-14 Month Thoracic Impedance ICM trend:     Adam GORMAN Garner, RN 11/14/2024 3:34 PM

## 2024-11-14 NOTE — Progress Notes (Unsigned)
 Name:[name] MRN: [id number] DOB: 10-09-1966   CHIEF COMPLAINT:  ESTABLISH CARE FOR OSA   HISTORY OF PRESENT ILLNESS: [name] is a 58 y.o. w/ a h/o OSA, HTN, DMII, CHF s/p AICD, atrial fibrillation and morbid obesity who presents to establish care for OSA. Reports that he was initially diagnosed with severe OSA several years ago and was subsequently started on CPAP therapy. Reports that he was started on CPAP therapy initially and then changed to BIPAP therapy due to pressure discomfort. Reports using BIPAP therapy every night, which is confirmed by compliance data. Reports a 100 lb weight loss over the last few years. Reports excessive daytime sleepiness despite excellent PAP compliance.   Reports nocturnal awakenings due to unclear reasons and occasionally has difficulty falling back to sleep. Admits to dry mouth and headaches. Denies RLS symptoms, dream enactment, cataplexy, hypnagogic or hypnapompic hallucinations. Reports a family history of sleep apnea. Denies drowsy driving. Denies alcohol or drug use. Smokes 1 pack of tobacco every two days.   Bedtime 12 am Sleep onset 20 mins Rise time 7-10 am   EPWORTH SLEEP SCORE 15    11/14/2024    1:00 PM  Results of the Epworth flowsheet  Sitting and reading 3  Watching TV 3  Sitting, inactive in a public place (e.g. a theatre or a meeting) 1  As a passenger in a car for an hour without a break 0  Lying down to rest in the afternoon when circumstances permit 3  Sitting and talking to someone 2  Sitting quietly after a lunch without alcohol 3  In a car, while stopped for a few minutes in traffic 0  Total score 15    PAST MEDICAL HISTORY :   has a past medical history of Alcohol abuse, Anxiety state, unspecified, Atrial fibrillation (HCC), CHF (congestive heart failure) (HCC), Chronic systolic heart failure (HCC), Diabetes mellitus, type II (HCC), Edema, Gout, History of medication noncompliance, Migraine, Obesity,  unspecified, Obstructive sleep apnea, Psychiatric disorder, Pulmonary embolism (HCC), and Shortness of breath.  has a past surgical history that includes Testicle surgery; Cardiac catheterization; Cardiac catheterization (N/A, 08/13/2015); RIGHT/LEFT HEART CATH AND CORONARY ANGIOGRAPHY (N/A, 04/14/2017); BIV ICD INSERTION CRT-D (N/A, 01/13/2022); Pacemaker insertion; and RIGHT HEART CATH (N/A, 09/15/2023). Prior to Admission medications  Medication Sig Start Date End Date Taking? Authorizing Provider  albuterol  (VENTOLIN  HFA) 108 (90 Base) MCG/ACT inhaler INHALE 1-2 PUFFS INTO THE LUNGS EVERY 4 (FOUR) HOURS AS NEEDED FOR SHORTNESS OF BREATH OR WHEEZING. 06/04/22  Yes Rolan Ezra RAMAN, MD  amiodarone  (PACERONE ) 200 MG tablet Take 0.5 tablets (100 mg total) by mouth daily. PLEASE SCHEDULE FOLLOW UP FOR MORE RERFILLS 08/09/24  Yes Rolan Ezra RAMAN, MD  atorvastatin  (LIPITOR) 40 MG tablet TAKE 1 TABLET BY MOUTH EVERY DAY 09/30/24  Yes Milford, [place], FNP  benzonatate  (TESSALON ) 100 MG capsule Take 1 capsule (100 mg total) by mouth every 8 (eight) hours. 11/07/24  Yes Rogelia Jerilynn RAMAN, MD  carvedilol  (COREG ) 6.25 MG tablet Take 1 tablet (6.25 mg total) by mouth 2 (two) times daily. Patient taking differently: Take 6.25 mg by mouth daily at 6 (six) AM. 06/16/23  Yes Milford, Harlene HERO, FNP  cyclobenzaprine  (FLEXERIL ) 10 MG tablet Take 0.5 tablets (5 mg total) by mouth 2 (two) times daily as needed for muscle spasms. 11/07/24  Yes Rogelia Jerilynn RAMAN, MD  dapagliflozin  propanediol (FARXIGA ) 10 MG TABS tablet Take 1 tablet (10 mg total) by mouth daily.  10/05/24  Yes Rolan Ezra RAMAN, MD  doxycycline  (VIBRAMYCIN ) 100 MG capsule Take 1 capsule (100 mg total) by mouth 2 (two) times daily. 11/07/24  Yes Rogelia Jerilynn RAMAN, MD  gabapentin  (NEURONTIN ) 300 MG capsule Take 300 mg by mouth 3 (three) times daily. 09/02/24  Yes [provider]  HYDROcodone -acetaminophen  (NORCO/VICODIN) 5-325 MG tablet Take 1 tablet  by mouth every 4 (four) hours as needed. 10/29/24 10/29/25 Yes Flint Raring K, PA-C  Lemborexant  (DAYVIGO ) 5 MG TABS Take 1 tablet (5 mg total) by mouth at bedtime as needed (insomnia). 11/09/24 01/08/25 Yes Hisada, Katheren, MD  methimazole  (TAPAZOLE ) 5 MG tablet TAKE 1 TABLET (5 MG TOTAL) BY MOUTH DAILY. 09/19/24  Yes Komal, Motwani, MD  pantoprazole  (PROTONIX ) 40 MG tablet TAKE 1 TABLET (40 MG TOTAL) BY MOUTH DAILY. SCHEDULE AN APPT FOR FURTHER REFILLS 08/25/24  Yes Joshua Debby CROME, MD  potassium chloride  SA (KLOR-CON  M) 20 MEQ tablet TAKE 2 TABLETS BY MOUTH DAILY 08/10/24  Yes Milford, Harlene HERO, FNP  PRESCRIPTION MEDICATION Inhale into the lungs See admin instructions. Bipap, pressure 16/12 with 2L of O2 - use whenever sleeping   Yes [provider]  sildenafil  (VIAGRA ) 50 MG tablet TAKE 1 TABLET BY MOUTH AS NEEDED FOR ERECTILE DYSFUNCTION BEGIN  WITH  1  TABLET,  MAY  TAKE  2  IF  NECESSARY 10/25/24  Yes Rolan Ezra RAMAN, MD  spironolactone  (ALDACTONE ) 25 MG tablet TAKE 1 TABLET BY MOUTH EVERYDAY AT BEDTIME 08/15/24  Yes Colletta Manuelita Garre, PA-C  tirzepatide  (MOUNJARO ) 2.5 MG/0.5ML Pen Inject 2.5 mg into the skin once a week. 10/18/24  Yes Joshua Debby CROME, MD  torsemide  (DEMADEX ) 20 MG tablet Take 2 tablets (40 mg total) by mouth every morning 02/22/24  Yes McLean, Dalton S, MD  traMADol  (ULTRAM -ER) 100 MG 24 hr tablet Take 1 tablet (100 mg total) by mouth daily. 10/18/24  Yes Joshua Debby CROME, MD  XARELTO  20 MG TABS tablet Take 1 tablet (20 mg total) by mouth daily with supper. 10/18/24  Yes Joshua Debby CROME, MD  pravastatin  (PRAVACHOL ) 40 MG tablet Take 40 mg by mouth daily.  02/20/12  [provider]   Allergies[1]  FAMILY HISTORY:  family history includes Cancer in his mother; Diabetes in his daughter, father, and another family member; Heart disease in his maternal grandmother; Hypertension in his mother and another family member; Stroke in an other family member. SOCIAL  HISTORY:  reports that he has been smoking cigarettes. He started smoking about 34 years ago. He has a 15 pack-year smoking history. He has never used smokeless tobacco. He reports that he does not currently use alcohol. He reports that he does not use drugs.   Review of Systems:  Gen:  Denies  fever, sweats, chills weight loss  HEENT: Denies blurred vision, double vision, ear pain, eye pain, hearing loss, nose bleeds, sore throat Cardiac:  No dizziness, chest pain or heaviness, chest tightness,edema, No JVD Resp:   No cough, -sputum production, -shortness of breath,-wheezing, -hemoptysis,  Gi: Denies swallowing difficulty, stomach pain, nausea or vomiting, diarrhea, constipation, bowel incontinence Gu:  Denies bladder incontinence, burning urine Ext:   Denies Joint pain, stiffness or swelling Skin: Denies  skin rash, easy bruising or bleeding or hives Endoc:  Denies polyuria, polydipsia , polyphagia or weight change Psych:   Denies depression, insomnia or hallucinations  Other:  All other systems negative  VITAL SIGNS: BP 120/88   Pulse 78   Temp 98 F (36.7  C)   Ht 5' 9 (1.753 m)   Wt (!) 351 lb 12.8 oz (159.6 kg)   SpO2 98%   BMI 51.95 kg/m    Physical Examination:   General Appearance: No distress  EYES PERRLA, EOM intact.   NECK Supple, No JVD Pulmonary: normal breath sounds, No wheezing.  CardiovascularNormal S1,S2.  No m/r/g.   Abdomen: Benign, Soft, non-tender. Skin:   warm, no rashes, no ecchymosis  Extremities: normal, no cyanosis, clubbing. Neuro:without focal findings,  speech normal  PSYCHIATRIC: Mood, affect within normal limits.   ASSESSMENT AND PLAN  OSA Due to significant weight loss, will complete in lab CPAP titration study. Discussed the consequences of untreated sleep apnea. Advised not to drive drowsy for safety of patient and others. Will complete further evaluation with titration study and follow up to review results.    HTN Stable, on  current management. Following with PCP.   Insomnia Counseled patient on stimulus control and improve sleep hygiene practices. Will discontinue Dayvigo  and try patient on Trazodone  50 mg nightly.    MEDICATION ADJUSTMENTS/LABS AND TESTS ORDERED: Recommend Sleep Study   Patient  satisfied with Plan of action and management. All questions answered  Follow up to review sleep study results and treatment plan.   I spent a total of 30 minutes reviewing chart data, face-to-face evaluation with the patient, counseling and coordination of care as detailed above.    Tenise Stetler, M.D.  Sleep Medicine [facility]           [1]  Allergies Allergen Reactions   Ace Inhibitors Anaphylaxis and Swelling    Angioedema   Isosorbide  Anaphylaxis   Buspirone Other (See Comments)    dizziness   Doxycycline  Other (See Comments)   Bidil [Isosorb Dinitrate-Hydralazine ] Other (See Comments)    headache   Digoxin      Unspecified side effects   Digoxin  And Related     Unspecified side effects   Isosorbide  Dinitrate

## 2024-11-17 ENCOUNTER — Encounter: Payer: Self-pay | Admitting: "Endocrinology

## 2024-11-17 ENCOUNTER — Ambulatory Visit: Admitting: "Endocrinology

## 2024-11-17 VITALS — BP 120/80 | HR 87 | Ht 69.0 in | Wt 355.0 lb

## 2024-11-17 DIAGNOSIS — E041 Nontoxic single thyroid nodule: Secondary | ICD-10-CM | POA: Diagnosis not present

## 2024-11-17 DIAGNOSIS — Z9229 Personal history of other drug therapy: Secondary | ICD-10-CM

## 2024-11-17 NOTE — Progress Notes (Signed)
 Outpatient Endocrinology Note Obadiah Birmingham, MD  11/17/2024   [name] [birth year] [id number]  Referring Provider: Joshua Debby LITTIE, MD Primary Care Provider: Joshua Debby LITTIE, MD Subjective  No chief complaint on file.   Assessment & Plan  Diagnoses and all orders for this visit:  Thyroid  nodule -     TSH + free T4  H/O amiodarone  therapy    [name] is currently not taking any methimazole . Self stopped methimzole in 07/2024 (was asked to take 2.5mg marica day) TSI/TRAb -ve He is on amiodarone  100 mg every day for Atrial fibrillation, on amiodarone  since 2022. 12/22/23: TSI/TRAb -ve 04/07/24: THYROID  SCAN AND UPTAKE : Decreased iodine  at take values at 4 hours and 24 hours. This may be artifactual given patient's chronic amiodarone  therapy Educated on thyroid  axis.  Recommend the following: patient is currently euthyroid. Ordered repeat lab in 3 mo. Patient doesn't want tor repeat it today.  Repeat labs now and then in 3 months or sooner if symptoms of hyper or hypothyroidism develop.   Right thyromegaly noted on exam 10/14/24: thyroid  ultrasound: reviewed and interpreted images for myself: agree with 1.6 cm right superior thyroid  nodule (nodule 1) meets criteria for 1 year follow-up ultrasound. F/u thyroid  ultrasound in 10/2025  Patient has very dry feet as well as discolored toenails, onychomycosis has history of diabetes managed by PCP Referred to podiatry previously  Follows up with ophthalmologist  I have reviewed current medications, nurse's notes, allergies, vital signs, past medical and surgical history, family medical history, and social history for this encounter. Counseled patient on symptoms, examination findings, lab findings, imaging results, treatment decisions and monitoring and prognosis. The patient understood the recommendations and agrees with the treatment plan. All questions regarding treatment plan were fully answered.   Return in about  3 months (around 02/15/2025) for visit + labs before next visit.   Obadiah Birmingham, MD  11/17/2024   I have reviewed current medications, nurse's notes, allergies, vital signs, past medical and surgical history, family medical history, and social history for this encounter. Counseled patient on symptoms, examination findings, lab findings, imaging results, treatment decisions and monitoring and prognosis. The patient understood the recommendations and agrees with the treatment plan. All questions regarding treatment plan were fully answered.   History of Present Illness [name] is a 58 y.o. year old male who presents to our clinic with hyperthyroidism diagnosed in 07/2023.    On amiodarone   C/o fatigue, poor sleep, leg swelling Throat may hurt intermittently   Symptoms suggestive of HYPOTHYROIDISM:  fatigue Yes weight gain Yes cold intolerance  No constipation  No  Symptoms suggestive of HYPERTHYROIDISM:  weight loss  Yes, just restarted mounjaro  and lost 2 lbs, gained before mounjaro  heat intolerance No hyperdefecation  No palpitations  No has pace maker   Compressive symptoms:  dysphagia  No dysphonia  No positional dyspnea (especially with simultaneous arms elevation)  No  Smokes  Yes On biotin  No Personal history of head/neck surgery/irradiation  No    Component     Latest Ref Rng 07/30/2023 08/28/2023  TSH     0.350 - 4.500 uIU/mL <0.010 (L)    Triiodothyronine,Free,Serum     2.0 - 4.4 pg/mL  3.0   T4,Free(Direct)     0.61 - 1.12 ng/dL  8.45 (H)    88/07/7974 CT CHEST, ABDOMEN AND PELVIS WITHOUT CONTRAST  Thyroid  gland demonstrate no significant findings.  Physical Exam  BP 120/80   Pulse 87  Ht 5' 9 (1.753 m)   Wt (!) 355 lb (161 kg)   SpO2 95%   BMI 52.42 kg/m  Constitutional: well developed, well nourished Head: normocephalic, atraumatic, no exophthalmos Eyes: sclera anicteric, no redness Neck: + R thyromegaly, no thyroid  tenderness; no  nodules palpated Lungs: normal respiratory effort Neurology: alert and oriented, no fine hand tremor Skin: dry, no appreciable rashes Musculoskeletal: no appreciable defects Psychiatric: normal mood and affect Diabetic Foot Exam - Simple   No data filed     Allergies Allergies  Allergen Reactions   Ace Inhibitors Anaphylaxis and Swelling    Angioedema   Isosorbide  Anaphylaxis   Buspirone Other (See Comments)    dizziness   Doxycycline  Other (See Comments)   Bidil [Isosorb Dinitrate-Hydralazine ] Other (See Comments)    headache   Digoxin      Unspecified side effects   Digoxin  And Related     Unspecified side effects   Isosorbide  Dinitrate     Current Medications Patient's Medications  New Prescriptions   No medications on file  Previous Medications   ALBUTEROL  (VENTOLIN  HFA) 108 (90 BASE) MCG/ACT INHALER    INHALE 1-2 PUFFS INTO THE LUNGS EVERY 4 (FOUR) HOURS AS NEEDED FOR SHORTNESS OF BREATH OR WHEEZING.   AMIODARONE  (PACERONE ) 200 MG TABLET    Take 0.5 tablets (100 mg total) by mouth daily. PLEASE SCHEDULE FOLLOW UP FOR MORE RERFILLS   ATORVASTATIN  (LIPITOR) 40 MG TABLET    TAKE 1 TABLET BY MOUTH EVERY DAY   BENZONATATE  (TESSALON ) 100 MG CAPSULE    Take 1 capsule (100 mg total) by mouth every 8 (eight) hours.   CARVEDILOL  (COREG ) 6.25 MG TABLET    Take 1 tablet (6.25 mg total) by mouth 2 (two) times daily.   CYCLOBENZAPRINE  (FLEXERIL ) 10 MG TABLET    Take 0.5 tablets (5 mg total) by mouth 2 (two) times daily as needed for muscle spasms.   DAPAGLIFLOZIN  PROPANEDIOL (FARXIGA ) 10 MG TABS TABLET    Take 1 tablet (10 mg total) by mouth daily.   DOXYCYCLINE  (VIBRAMYCIN ) 100 MG CAPSULE    Take 1 capsule (100 mg total) by mouth 2 (two) times daily.   GABAPENTIN  (NEURONTIN ) 300 MG CAPSULE    Take 300 mg by mouth 3 (three) times daily.   HYDROCODONE -ACETAMINOPHEN  (NORCO/VICODIN) 5-325 MG TABLET    Take 1 tablet by mouth every 4 (four) hours as needed.   LEMBOREXANT  (DAYVIGO ) 5  MG TABS    Take 1 tablet (5 mg total) by mouth at bedtime as needed (insomnia).   METHIMAZOLE  (TAPAZOLE ) 5 MG TABLET    TAKE 1 TABLET (5 MG TOTAL) BY MOUTH DAILY.   PANTOPRAZOLE  (PROTONIX ) 40 MG TABLET    TAKE 1 TABLET (40 MG TOTAL) BY MOUTH DAILY. SCHEDULE AN APPT FOR FURTHER REFILLS   POTASSIUM CHLORIDE  SA (KLOR-CON  M) 20 MEQ TABLET    TAKE 2 TABLETS BY MOUTH DAILY   PRESCRIPTION MEDICATION    Inhale into the lungs See admin instructions. Bipap, pressure 16/12 with 2L of O2 - use whenever sleeping   SILDENAFIL  (VIAGRA ) 50 MG TABLET    TAKE 1 TABLET BY MOUTH AS NEEDED FOR ERECTILE DYSFUNCTION BEGIN  WITH  1  TABLET,  MAY  TAKE  2  IF  NECESSARY   SPIRONOLACTONE  (ALDACTONE ) 25 MG TABLET    TAKE 1 TABLET BY MOUTH EVERYDAY AT BEDTIME   TIRZEPATIDE  (MOUNJARO ) 2.5 MG/0.5ML PEN    Inject 2.5 mg into the skin once a week.   TORSEMIDE  (  DEMADEX ) 20 MG TABLET    Take 2 tablets (40 mg total) by mouth every morning   TRAMADOL  (ULTRAM -ER) 100 MG 24 HR TABLET    Take 1 tablet (100 mg total) by mouth daily.   TRAZODONE  (DESYREL ) 50 MG TABLET    Take 1 tablet (50 mg total) by mouth at bedtime.   XARELTO  20 MG TABS TABLET    Take 1 tablet (20 mg total) by mouth daily with supper.  Modified Medications   No medications on file  Discontinued Medications   No medications on file    Past Medical History Past Medical History:  Diagnosis Date   Alcohol abuse    Anxiety state, unspecified    Atrial fibrillation (HCC)    CHF (congestive heart failure) (HCC)    Chronic systolic heart failure (HCC)    Diabetes mellitus, type II (HCC)    Edema    Gout    History of medication noncompliance    Migraine    Obesity, unspecified    Obstructive sleep apnea    Psychiatric disorder    Pulmonary embolism (HCC)    Shortness of breath     Past Surgical History Past Surgical History:  Procedure Laterality Date   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   Procedure: BIV ICD INSERTION CRT-D;  Surgeon: Cindie Ole DASEN, MD;  Location: [facility] INVASIVE CV LAB;  Service: Cardiovascular;  Laterality: N/A;   CARDIAC CATHETERIZATION     CARDIAC CATHETERIZATION N/A 08/13/2015   Procedure: Right/Left Heart Cath and Coronary Angiography;  Surgeon: Ezra GORMAN Shuck, MD;  Location: [facility] INVASIVE CV LAB;  Service: Cardiovascular;  Laterality: N/A;   PACEMAKER INSERTION     RIGHT HEART CATH N/A 09/15/2023   Procedure: RIGHT HEART CATH;  Surgeon: Shuck Ezra GORMAN, MD;  Location: [facility] INVASIVE CV LAB;  Service: Cardiovascular;  Laterality: N/A;   RIGHT/LEFT HEART CATH AND CORONARY ANGIOGRAPHY N/A 04/14/2017   Procedure: Right/Left Heart Cath and Coronary Angiography;  Surgeon: Shuck Ezra GORMAN, MD;  Location: [facility] INVASIVE CV LAB;  Service: Cardiovascular;  Laterality: N/A;   TESTICLE SURGERY      Family History family history includes Cancer in his mother; Diabetes in his daughter, father, and another family member; Heart disease in his maternal grandmother; Hypertension in his mother and another family member; Stroke in an other family member.  Social History Social History   Socioeconomic History   Marital status: Divorced    Spouse name: Not on file   Number of children: 3   Years of education: Not on file   Highest education level: Not on file  Occupational History   Occupation: DISABLED  Tobacco Use   Smoking status: Some Days    Current packs/day: 0.00    Average packs/day: 0.5 packs/day for 30.0 years (15.0 ttl pk-yrs)    Types: Cigarettes    Start date: 05/15/1990    Last attempt to quit: 05/15/2020    Years since quitting: 4.5   Smokeless tobacco: Never   Tobacco comments:    09/20/2024 Patient states when he smokes he smokes maybe 5 cigarettes     vaping - nicotine -free products  Vaping Use   Vaping status: Never Used  Substance and Sexual Activity   Alcohol use: Not Currently    Alcohol/week: 0.0 standard drinks of alcohol   Drug use: No   Sexual activity: Not Currently  Other Topics Concern    Not on file  Social History Narrative   He smokes about a  pack per day and he has been smoking since he was 58 years of age.  He drinks alcohol occasionally, but he denies any illicit drug abuse.  He is presently on disability.    Lives with wife in a 2 story home.  Has 2 children.   Previously worked in office manager, last worked in 1998.   Highest level of education:  11th grade      Lives with his daughter's mother, for right now.  Recently divorced. (2024).        Social Drivers of Health   Tobacco Use: High Risk (11/17/2024)   Patient History    Smoking Tobacco Use: Some Days    Smokeless Tobacco Use: Never    Passive Exposure: Not on file  Financial Resource Strain: Low Risk (09/23/2024)   Overall Financial Resource Strain (CARDIA)    Difficulty of Paying Living Expenses: Not hard at all  Food Insecurity: No Food Insecurity (09/23/2024)   Epic    Worried About Programme Researcher, Broadcasting/film/video in the Last Year: Never true    Ran Out of Food in the Last Year: Never true  Transportation Needs: No Transportation Needs (09/23/2024)   Epic    Lack of Transportation (Medical): No    Lack of Transportation (Non-Medical): No  Physical Activity: Inactive (09/23/2024)   Exercise Vital Sign    Days of Exercise per Week: 0 days    Minutes of Exercise per Session: 0 min  Stress: Stress Concern Present (09/23/2024)   [employer] of Occupational Health - Occupational Stress Questionnaire    Feeling of Stress: Very much  Social Connections: Socially Isolated (09/23/2024)   Social Connection and Isolation Panel    Frequency of Communication with Friends and Family: More than three times a week    Frequency of Social Gatherings with Friends and Family: Never    Attends Religious Services: Never    Database Administrator or Organizations: No    Attends Banker Meetings: Never    Marital Status: Divorced  Catering Manager Violence: Not At Risk (09/23/2024)   Epic    Fear of Current  or Ex-Partner: No    Emotionally Abused: No    Physically Abused: No    Sexually Abused: No  Depression (PHQ2-9): High Risk (09/23/2024)   Depression (PHQ2-9)    PHQ-2 Score: 14  Alcohol Screen: Low Risk (09/23/2024)   Alcohol Screen    Last Alcohol Screening Score (AUDIT): 0  Housing: Unknown (09/23/2024)   Epic    Unable to Pay for Housing in the Last Year: No    Number of Times Moved in the Last Year: Not on file    Homeless in the Last Year: No  Utilities: Not At Risk (09/23/2024)   Epic    Threatened with loss of utilities: No  Health Literacy: Adequate Health Literacy (09/23/2024)   B1300 Health Literacy    Frequency of need for help with medical instructions: Never    Laboratory Investigations Lab Results  Component Value Date   TSH 2.67 10/03/2024   TSH 4.820 (H) 09/20/2024   TSH 6.705 (H) 06/30/2024   FREET4 1.2 10/03/2024   FREET4 1.26 09/20/2024   FREET4 1.10 06/30/2024     Lab Results  Component Value Date   TSI 102 12/22/2023     No components found for: TRAB   Lab Results  Component Value Date   CHOL 143 10/18/2024   Lab Results  Component Value Date   HDL 35.50 (L) 10/18/2024  Lab Results  Component Value Date   LDLCALC 86 10/18/2024   Lab Results  Component Value Date   TRIG 110.0 10/18/2024   Lab Results  Component Value Date   CHOLHDL 4 10/18/2024   Lab Results  Component Value Date   CREATININE 1.42 (H) 11/07/2024   Lab Results  Component Value Date   GFR 40.56 (L) 02/06/2023      Component Value Date/Time   NA 139 11/07/2024 1607   NA 140 09/20/2024 1011   K 3.9 11/07/2024 1607   CL 103 11/07/2024 1426   CO2 23 11/07/2024 1426   GLUCOSE 103 (H) 11/07/2024 1426   BUN 17 11/07/2024 1426   BUN 15 09/20/2024 1011   CREATININE 1.42 (H) 11/07/2024 1426   CREATININE 1.61 (H) 12/16/2023 1012   CALCIUM  8.4 (L) 11/07/2024 1426   PROT 7.5 11/07/2024 1426   PROT 7.4 09/20/2024 1011   ALBUMIN  3.1 (L) 11/07/2024 1426   ALBUMIN   3.8 09/20/2024 1011   AST 15 11/07/2024 1426   ALT 15 11/07/2024 1426   ALKPHOS 104 11/07/2024 1426   BILITOT 1.3 (H) 11/07/2024 1426   BILITOT 0.7 09/20/2024 1011   GFRNONAA 58 (L) 11/07/2024 1426   GFRAA >60 05/15/2020 1452      Latest Ref Rng & Units 11/07/2024    4:07 PM 11/07/2024    2:26 PM 10/27/2024    5:51 PM  BMP  Glucose 70 - 99 mg/dL  896  885   BUN 6 - 20 mg/dL  17  20   Creatinine 9.38 - 1.24 mg/dL  8.57  8.40   Sodium 864 - 145 mmol/L 139  136  136   Potassium 3.5 - 5.1 mmol/L 3.9  3.9  4.9   Chloride 98 - 111 mmol/L  103  103   CO2 22 - 32 mmol/L  23  21   Calcium  8.9 - 10.3 mg/dL  8.4  9.1        Component Value Date/Time   WBC 7.0 11/07/2024 1426   RBC 5.16 11/07/2024 1426   HGB 14.6 11/07/2024 1607   HCT 43.0 11/07/2024 1607   PLT 281 11/07/2024 1426   MCV 78.9 (L) 11/07/2024 1426   MCH 26.7 11/07/2024 1426   MCHC 33.9 11/07/2024 1426   RDW 15.9 (H) 11/07/2024 1426   LYMPHSABS 2.3 11/07/2024 1426   MONOABS 0.8 11/07/2024 1426   EOSABS 0.2 11/07/2024 1426   BASOSABS 0.0 11/07/2024 1426      Parts of this note may have been dictated using voice recognition software. There may be variances in spelling and vocabulary which are unintentional. Not all errors are proofread. Please notify the dino if any discrepancies are noted or if the meaning of any statement is not clear.

## 2024-11-18 ENCOUNTER — Telehealth: Payer: Self-pay

## 2024-11-18 NOTE — Telephone Encounter (Signed)
 CRM disappeared -- pt had called in saying the trazodone  was not helping and seeking an alternative.

## 2024-11-22 ENCOUNTER — Ambulatory Visit

## 2024-11-22 DIAGNOSIS — Z95810 Presence of automatic (implantable) cardiac defibrillator: Secondary | ICD-10-CM

## 2024-11-22 DIAGNOSIS — I5022 Chronic systolic (congestive) heart failure: Secondary | ICD-10-CM

## 2024-11-22 NOTE — Progress Notes (Signed)
 EPIC Encounter for ICM Monitoring  Patient Name: [name] is a 58 y.o. male Date: 11/22/2024 Primary Care Physican: Joshua Debby LITTIE, MD Primary Cardiologist: Rolan Electrophysiologist: Kennyth Pore Pacing: 96% 02/04/2023 Weight:  358 lbs       06/01/2023 Weight: 328 lbs (losing weight taking Mounjaro )   06/09/2023 Weight: 328 lbs             07/07/2023 Weight: 308 lbs   09/16/2023 Weight: 317 lbs    02/22/2024 Office Weight: 330 lbs 03/30/2024 Weight: 332 lbs (was up to 336 lbs) 04/01/2024 Weight:  331 lbs 07/04/2024 Office Weight: 344 lbs 09/20/2024 Office Weight: 349 lbs 10/07/2024 Weight: 345 lbs   Spoke with patient and heart failure questions reviewed.  Transmission results reviewed.  Pt reports improvement in lower extremity swelling.     Diet:  Drinks a lot of fluids.     Since 11/14/2024 ICM Remote Transmission:  CorVue thoracic impedance suggesting fluid levels returned to normal 11/18/2024 after resuming Torsemide  as prescribed.   .   Prescribed:  Torsemide  20 mg Take 2 tablet(s) (40 mg total) every morning.   Potassium 20 mEq take 2 tablet(s) (40 mEq total) by mouth daily.   Spironolactone  25 mg take 1 tablet daily   Labs: 11/07/2024 Potassium 3.9, Sodium 139 (4:07 PM) 11/07/2024 Creatinine 1.42, BUN 17, Potassium 3.9, Sodium 136, GFR 58 (2:26 PM) 10/27/2024 Creatinine 1.59, BUN 20, Potassium 4.9, Sodium 136, GFR 51 09/20/2024 Creatinine 1.25, BUN 15, Potassium 4.1, Sodium 140, GFR 68 09/19/2024 Creatinine 1.17, BUN 12, Potassium 3.6, Sodium 136 06/30/2024 Creatinine 1.45, BUN 18, Potassium 4.4, Sodium 138, GFR 57 05/21/2024 Creatinine 1.33, BUN 19, Potassium 4.1, Sodium 135, GFR >60 02/22/2024 Creatinine 1.77, BUN 27, Potassium 3.7, Sodium 136, GFR 45 A complete set of results can be found in Results Review.   Recommendations:  No changes and encouraged to call if experiencing any fluid symptoms.   Follow-up plan: ICM clinic phone appointment 12/19/2024.   91 day device  clinic remote transmission 01/25/2024.     EP/Cardiology Office Visits:  12/14/2024 with Dr Rolan.   Recall 09/15/2025 with Charlies Arthur, PA.   Copy of ICM check sent to Dr. Kennyth.   Remote monitoring is medically necessary for Heart Failure Management.    Daily Thoracic Impedance ICM trend: 08/24/2024 through 11/22/2024.    12-14 Month Thoracic Impedance ICM trend:     [name], RN 11/22/2024 7:36 AM

## 2024-11-22 NOTE — Telephone Encounter (Signed)
 Patient advised as below. New prescription sent to [facility]. NFN.    Reddy, Pallavi D, MD  11/30/24 10:48 AM Recommend increasing Trazodone  to 100 mg nightly.   _____________________________________________________________  Copied from CRM #[id number]. Topic: Clinical - Prescription Issue >> Nov 18, 2024  8:55 AM Joesph PARAS wrote: Reason for CRM: Patient states that TRAZADONE rx is not helping him at all. Patient states that it is not helping him at current dosage and would like an alternative. Patient also states sleep study is not til January 21. >> Nov 22, 2024  4:08 PM Joesph PARAS wrote: Patient is calling to request an update on trazodone  dosage. Patient is unhappy that this may go unresolved over the holiday. Patient requesting advisement ASAP.  >> Nov 22, 2024  9:52 AM Corean SAUNDERS wrote: Patient is requesting an update on what Dr. Jess advised for him wether it be a higher Trazodone  dosage or an alternative medication.

## 2024-11-22 NOTE — Telephone Encounter (Unsigned)
 Copied from CRM #[id number]. Topic: Clinical - Prescription Issue >> Nov 22, 2024  9:52 AM [name] wrote: Patient is requesting an update on what Dr. Jess advised for him wether it be a higher Trazodone  dosage or an alternative medication.

## 2024-11-28 NOTE — Telephone Encounter (Unsigned)
 Copied from CRM #[id number]. Topic: Clinical - Prescription Issue >> Nov 18, 2024  8:55 AM Joesph PARAS wrote: Reason for CRM: Patient states that TRAZADONE rx is not helping him at all. Patient states that it is not helping him at current dosage and would like an alternative. Patient also states sleep study is not til January 21. >> Nov 25, 2024 11:04 AM Corean SAUNDERS wrote: Patient is upset that Dr. Jess never called him back since last week about his medication changes - patient states he will be looking for a new doctor and writing a bad review.  >> Nov 22, 2024  4:08 PM Joesph PARAS wrote: Patient is calling to request an update on trazodone  dosage. Patient is unhappy that this may go unresolved over the holiday. Patient requesting advisement ASAP.  >> Nov 22, 2024  9:52 AM Corean SAUNDERS wrote: Patient is requesting an update on what Dr. Jess advised for him wether it be a higher Trazodone  dosage or an alternative medication.

## 2024-11-30 MED ORDER — TRAZODONE HCL 100 MG PO TABS: 50.0000 mg | ORAL_TABLET | Freq: Every day | ORAL | 3 refills | Status: DC

## 2024-11-30 NOTE — Addendum Note (Signed)
 Addended by: ROGELIA LARAY RAMAN on: 11/30/2024 10:56 AM   Modules accepted: Orders

## 2024-12-02 NOTE — Progress Notes (Signed)
 [name]                                          MRN: [id number]   12/02/2024   The [facility] Quality Team Specialist reviewed this patient medical record for the purposes of chart review for care gap closure. The following were reviewed: abstraction for care gap closure-kidney health evaluation for diabetes:eGFR  and uACR.    [facility] Quality Team

## 2024-12-05 ENCOUNTER — Ambulatory Visit (HOSPITAL_COMMUNITY): Admitting: Cardiology
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
## 2024-12-13 ENCOUNTER — Telehealth (HOSPITAL_COMMUNITY): Payer: Self-pay | Admitting: Cardiology

## 2024-12-13 NOTE — Telephone Encounter (Signed)
 Called to confirm/remind patient of their appointment at the [facility] on 12/13/24.   Appointment:   [x] Confirmed  [] Left mess   [] No answer/No voice mail  [] VM Full/unable to leave message  [] Phone not in service  Patient reminded to bring all medications and/or complete list.  Confirmed patient has transportation. Gave directions, instructed to utilize valet parking.

## 2024-12-14 ENCOUNTER — Ambulatory Visit (HOSPITAL_COMMUNITY): Payer: Self-pay | Admitting: Cardiology

## 2024-12-14 ENCOUNTER — Ambulatory Visit (HOSPITAL_BASED_OUTPATIENT_CLINIC_OR_DEPARTMENT_OTHER)
Admission: RE | Admit: 2024-12-14 | Discharge: 2024-12-14 | Disposition: A | Discharge status: Home / Self Care | Source: Ambulatory Visit | Attending: Cardiology | Admitting: Cardiology

## 2024-12-14 ENCOUNTER — Other Ambulatory Visit (HOSPITAL_COMMUNITY): Payer: Self-pay | Admitting: Cardiology

## 2024-12-14 ENCOUNTER — Encounter (HOSPITAL_COMMUNITY): Payer: Self-pay | Admitting: Cardiology

## 2024-12-14 ENCOUNTER — Other Ambulatory Visit (HOSPITAL_COMMUNITY): Payer: Self-pay

## 2024-12-14 VITALS — BP 94/60 | HR 81 | Wt 361.6 lb

## 2024-12-14 DIAGNOSIS — M79603 Pain in arm, unspecified: Secondary | ICD-10-CM | POA: Insufficient documentation

## 2024-12-14 DIAGNOSIS — Z555 Less than a high school diploma: Secondary | ICD-10-CM | POA: Insufficient documentation

## 2024-12-14 DIAGNOSIS — F419 Anxiety disorder, unspecified: Secondary | ICD-10-CM | POA: Insufficient documentation

## 2024-12-14 DIAGNOSIS — Z7985 Long-term (current) use of injectable non-insulin antidiabetic drugs: Secondary | ICD-10-CM | POA: Insufficient documentation

## 2024-12-14 DIAGNOSIS — F410 Panic disorder [episodic paroxysmal anxiety] without agoraphobia: Secondary | ICD-10-CM | POA: Insufficient documentation

## 2024-12-14 DIAGNOSIS — E785 Hyperlipidemia, unspecified: Secondary | ICD-10-CM | POA: Insufficient documentation

## 2024-12-14 DIAGNOSIS — I959 Hypotension, unspecified: Secondary | ICD-10-CM | POA: Insufficient documentation

## 2024-12-14 DIAGNOSIS — Z7901 Long term (current) use of anticoagulants: Secondary | ICD-10-CM | POA: Insufficient documentation

## 2024-12-14 DIAGNOSIS — M542 Cervicalgia: Secondary | ICD-10-CM | POA: Insufficient documentation

## 2024-12-14 DIAGNOSIS — I5022 Chronic systolic (congestive) heart failure: Secondary | ICD-10-CM | POA: Insufficient documentation

## 2024-12-14 DIAGNOSIS — E0590 Thyrotoxicosis, unspecified without thyrotoxic crisis or storm: Secondary | ICD-10-CM | POA: Insufficient documentation

## 2024-12-14 DIAGNOSIS — I428 Other cardiomyopathies: Secondary | ICD-10-CM | POA: Insufficient documentation

## 2024-12-14 DIAGNOSIS — Z635 Disruption of family by separation and divorce: Secondary | ICD-10-CM | POA: Insufficient documentation

## 2024-12-14 DIAGNOSIS — E1122 Type 2 diabetes mellitus with diabetic chronic kidney disease: Secondary | ICD-10-CM | POA: Insufficient documentation

## 2024-12-14 DIAGNOSIS — G4733 Obstructive sleep apnea (adult) (pediatric): Secondary | ICD-10-CM | POA: Insufficient documentation

## 2024-12-14 DIAGNOSIS — F32A Depression, unspecified: Secondary | ICD-10-CM | POA: Insufficient documentation

## 2024-12-14 DIAGNOSIS — Z6841 Body Mass Index (BMI) 40.0 and over, adult: Secondary | ICD-10-CM | POA: Insufficient documentation

## 2024-12-14 DIAGNOSIS — I480 Paroxysmal atrial fibrillation: Secondary | ICD-10-CM | POA: Insufficient documentation

## 2024-12-14 DIAGNOSIS — Z86711 Personal history of pulmonary embolism: Secondary | ICD-10-CM | POA: Insufficient documentation

## 2024-12-14 DIAGNOSIS — Z8674 Personal history of sudden cardiac arrest: Secondary | ICD-10-CM | POA: Insufficient documentation

## 2024-12-14 DIAGNOSIS — F17210 Nicotine dependence, cigarettes, uncomplicated: Secondary | ICD-10-CM | POA: Insufficient documentation

## 2024-12-14 DIAGNOSIS — G8929 Other chronic pain: Secondary | ICD-10-CM | POA: Insufficient documentation

## 2024-12-14 DIAGNOSIS — Z7984 Long term (current) use of oral hypoglycemic drugs: Secondary | ICD-10-CM | POA: Insufficient documentation

## 2024-12-14 DIAGNOSIS — M79643 Pain in unspecified hand: Secondary | ICD-10-CM | POA: Insufficient documentation

## 2024-12-14 DIAGNOSIS — Z4502 Encounter for adjustment and management of automatic implantable cardiac defibrillator: Secondary | ICD-10-CM | POA: Insufficient documentation

## 2024-12-14 DIAGNOSIS — N183 Chronic kidney disease, stage 3 unspecified: Secondary | ICD-10-CM | POA: Insufficient documentation

## 2024-12-14 DIAGNOSIS — Z79899 Other long term (current) drug therapy: Secondary | ICD-10-CM | POA: Insufficient documentation

## 2024-12-14 DIAGNOSIS — I130 Hypertensive heart and chronic kidney disease with heart failure and stage 1 through stage 4 chronic kidney disease, or unspecified chronic kidney disease: Secondary | ICD-10-CM | POA: Insufficient documentation

## 2024-12-14 DIAGNOSIS — I472 Ventricular tachycardia, unspecified: Secondary | ICD-10-CM | POA: Insufficient documentation

## 2024-12-14 LAB — COMPREHENSIVE METABOLIC PANEL WITH GFR
ALT: 15 U/L (ref 0–44)
AST: 19 U/L (ref 15–41)
Albumin: 3.7 g/dL (ref 3.5–5.0)
Alkaline Phosphatase: 143 U/L — ABNORMAL HIGH (ref 38–126)
Anion gap: 12 (ref 5–15)
BUN: 22 mg/dL — ABNORMAL HIGH (ref 6–20)
CO2: 22 mmol/L (ref 22–32)
Calcium: 9 mg/dL (ref 8.9–10.3)
Chloride: 103 mmol/L (ref 98–111)
Creatinine, Ser: 1.4 mg/dL — ABNORMAL HIGH (ref 0.61–1.24)
GFR, Estimated: 59 mL/min — ABNORMAL LOW
Glucose, Bld: 118 mg/dL — ABNORMAL HIGH (ref 70–99)
Potassium: 4.2 mmol/L (ref 3.5–5.1)
Sodium: 137 mmol/L (ref 135–145)
Total Bilirubin: 0.8 mg/dL (ref 0.0–1.2)
Total Protein: 8 g/dL (ref 6.5–8.1)

## 2024-12-14 LAB — T4, FREE: Free T4: 1.44 ng/dL (ref 0.80–2.00)

## 2024-12-14 LAB — TSH: TSH: 5.04 u[IU]/mL — ABNORMAL HIGH (ref 0.350–4.500)

## 2024-12-14 LAB — PRO BRAIN NATRIURETIC PEPTIDE: Pro Brain Natriuretic Peptide: 1569 pg/mL — ABNORMAL HIGH

## 2024-12-14 MED ORDER — CARVEDILOL 6.25 MG PO TABS: 6.2500 mg | ORAL_TABLET | Freq: Two times a day (BID) | ORAL | 11 refills | Status: DC

## 2024-12-14 MED ORDER — METOLAZONE 2.5 MG PO TABS: 2.5000 mg | ORAL_TABLET | Freq: Every day | ORAL | 0 refills | Status: DC

## 2024-12-14 MED ORDER — POTASSIUM CHLORIDE CRYS ER 20 MEQ PO TBCR: 80.0000 meq | EXTENDED_RELEASE_TABLET | Freq: Every day | ORAL | 3 refills | Status: DC

## 2024-12-14 MED ORDER — CYCLOBENZAPRINE HCL 10 MG PO TABS: 10.0000 mg | ORAL_TABLET | Freq: Two times a day (BID) | ORAL | 0 refills | Status: AC | PRN

## 2024-12-14 MED ORDER — TORSEMIDE 20 MG PO TABS: 80.0000 mg | ORAL_TABLET | Freq: Every day | ORAL | 6 refills | Status: DC

## 2024-12-14 NOTE — Patient Instructions (Signed)
 CHANGE Torsemide  to 80 mg ( 4 tabs) daily.  CHANGE Potassium to 80 mEq ( 4 Tabs) daily.  TAKE METOLAZONE  2.5 MG TODAY WITH YOUR 4 TORSEMIDE  TABLETS AND YOUR 4 POTASSIUM TODAY.[name]  PLEASE MAKE SURE YOU ARE TAKING YOUR CARVEDILOL  TWICE A DAY.  Labs done today, your results will be available in MyChart, we will contact you for abnormal readings.  You are scheduled for a Cardiac Catheterization on Friday, January 16 with Dr. Ezra Shuck.  1. Please arrive at the [facility] (Main Entrance A) at [facility]: [address] [place], [place] [address] at 5:30 AM (This time is 2 hour(s) before your procedure to ensure your preparation).   Free valet parking service is available. You will check in at ADMITTING. The support person will be asked to wait in the waiting room.  It is OK to have someone drop you off and come back when you are ready to be discharged.    Special note: Every effort is made to have your procedure done on time. Please understand that emergencies sometimes delay scheduled procedures.  2. Diet: Nothing to eat after midnight.  3. Medication instructions in preparation for your procedure:   Contrast Allergy: No  HOLD YOUR TORSEMIDE ,SPIRONOLACTONE , FARXIGA  AND XARELTO  THE MORNING OF YOUR PROCEDURE.  On the morning of your procedure, take any morning medicines NOT listed above.  You may use sips of water .  6. Plan to go home the same day, you will only stay overnight if medically necessary. 7. Bring a current list of your medications and current insurance cards. 8. You MUST have a responsible person to drive you home. 9. Someone MUST be with you the first 24 hours after you arrive home or your discharge will be delayed. 10. Please wear clothes that are easy to get on and off and wear slip-on shoes.  If you have any questions or concerns before your next appointment please send us  a message through [place] or call our office at [phone].    TO LEAVE A MESSAGE  FOR THE NURSE SELECT OPTION 2, PLEASE LEAVE A MESSAGE INCLUDING: YOUR NAME DATE OF BIRTH CALL BACK NUMBER REASON FOR CALL**this is important as we prioritize the call backs  YOU WILL RECEIVE A CALL BACK THE SAME DAY AS LONG AS YOU CALL BEFORE 4:00 PM  At the [facility], you and your health needs are our priority. As part of our continuing mission to provide you with exceptional heart care, we have created designated Provider Care Teams. These Care Teams include your primary Cardiologist (physician) and Advanced Practice Providers (APPs- Physician Assistants and Nurse Practitioners) who all work together to provide you with the care you need, when you need it.   You may see any of the following providers on your designated Care Team at your next follow up: Dr Toribio Fuel Dr Ezra Shuck Dr. Morene Brownie Greig Mosses, NP Caffie Shed, [place] [facility] [place], [place] Beckey Coe, NP Jordan Lee, NP Ellouise Class, NP Tinnie Redman, PharmD Jaun Bash, PharmD   Please be sure to bring in all your medications bottles to every appointment.    Thank you for choosing [facility]

## 2024-12-14 NOTE — Progress Notes (Signed)
 [name]

## 2024-12-14 NOTE — Progress Notes (Signed)
 "  Advanced Heart Failure Clinic Note   Primary Care: Dr. Debby Molt HF Cardiologist: Dr. Rolan   Chief complaint: CHF  HPI: [name] is a 59 y.o. male with a past medical history of NICM, EF 25-30% in March 2018, felt to be related to prior ETOH abuse. He also has a history of PE 03/2014 completed a years course of Xarelto , morbid obesity, OSA, and tobacco abuse.    He was admitted 11/12/15-11/14/15 with acute on chronic systolic CHF and palpitations. He wore a 30 day event monitor at discharge as he had frequent PVC's and questionable Afib on telemetry. Also with some NSVT, so he was started on Amiodarone , but at follow up had not started taking it. He had previously refused ICD and was seen inpatient by Dr. Kelsie who felt that his morbid obesity was a prohibitive factor.    He was seen in the clinic in April 2018. He had started drinking ETOH again. Volume status was stable, he is not an Entresto candidate due to angioedema with lisinopril. Weight was 411 pounds.    Admitted 04/12/17 with SOB, chest pain. D- dimer was 1.16, chest CT without central obstructing PE, however more peripheral and subsegmental pulmonary artery branches were not confidently evaluated due to his body habitus. He was started on a heparin  gtt for presumed PE, however VQ scan showed no PE. Troponin was elevated, peaked at 3.37. LHC showed no CAD. Echo showed an EF of 15%, grade 2 DD, no pericarditis. He was diuresed with IV lasix , and started on torsemide  20mg  at discharge. Discharge weight was 405 pounds.   Admitted 5/22 through 04/24/2018 with abdominal pain, nausea, and vomiting. 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 Did not require surgical intervention. Followed by outpatient GI.   Echo in 2/21 with EF 20-25%, severe LV dilation.   Presented 05/09/21 w/ SOB 2/2 acute CHF w ? PNA.  CTA showed multifocal ?PNA, no PE. Also in Afib w/ RVR in 150s on admit. Initially required bipap, developed hemoptysis and intubated. Treated  w/ IV Lasix . Echo 05/10/21: LVEF < 20%. RV moderately reduced. Improved and was extubated 6/13. Went into Afib w/ RVR. Refused to wear BiPAP. Developed severe agitation/hypoxia and re-intubated.  Went into VT>>VF arrest, treated w/ ACLS>>ROSC after . Developed refractory AFib again next morning requiring emergent cardioversion 05/16/21. He was transitioned to PO amiodarone . HR remained in 40-50's, metoprolol  and sildenafil  were stopped. Discharge weight 367 lbs.  St Jude CRT-D device placed in 2/23.  Echo 8/23 showed EF <20%, mild MR, RV normal.  CPX in 9/23 showed moderate functional limitation due to HF and body habitus.   Echo in 9/24 showed EF <20% with severe LV dilation, normal RV size/function, no significant MR.   CPX was done in 10/24, this showed only mild to moderate HF limitation.  However, RHC in 10/24 showed mildly elevated filling pressures and low cardiac index (1.69 Fick, 2.06 Thermo).  He had workup for LVAD (BMI too high for transplant and has not quit smoking). However, given minimal symptoms, he decided against it.  He is here today for CHF follow-up. He has been doing poorly recently.  He says that he stopped his psych meds (clonazepam  and others) last summer when he separated from his wife and since then has had a lot of anxiety.  He reports panic attacks where he will wake up at night profoundly short of breath.  These episodes actually sound like PND.  He misses his meds fairly frequently, only takes Coreg   once a day.  He has a lot of chronic pain with neuropathic-type pain in his neck/arms/hands.  He has profound orthopnea and sleeps sitting up often.  Weight is up 16 lbs even though he is still sporadically taking tirzepatide . He is using Bipap regularly.  He is short of breath walking short distances (around house) and has developed significant peripheral edema.    No ETOH or drug use. Smoking several cigarettes/day.    ECG (personally reviewed): A-BiV pacing  [facility]  CRT-D device interrogation: 95% BiV pacing, 52% a-pacing, no AF, no VT, thoracic impedance trending down.   Labs (11/25): LDL 86 Labs (12/25): BNP 310, K 3.9, creatinine 1.42, LFTs normal, hb 13.8  PMH: 1. Nonischemic cardiomyopathy: Prior cath with no significant CAD.  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  Prior echoes with EF as low as 25%.  Echo (9/13) with EF 35-40%, moderate to severe LV dilation, diffuse hypokinesis, mild MR. Echo (5/15) with EF 30-35%, moderate to severe LAE, normal RV size and systolic function.  Angioedema with ACEI, headaches with hydralazine /nitrates. Echo (3/16) with EF 25-30%, severe LV dilation, normal RV size and systolic function.  [facility] 08/13/15 showed no significant coronary disease; RA mean 6, PA 33/11 mean 23, PCWP mean 13, Fick CO/CI 4.75 /1.68 (difficult study, radial artery spasm, if needs future cath would use groin).  Echo (9/16) showed EF 20-25%.   - Echo (3/18): EF 25-30%, moderate LAE - Echo 4/18 EF 15% - Echo 7/19 EF 20-25% - Echo 2/21 with EF 20-25%, severe LV dilation.  - Echo 6/22 with EF < 20%, normal RV. - St Jude CRT-D 2/23.  - Echo (8/23): EF < 20%, mild MR, RV normal.  - CPX (9/23): Peak VO2 13.1, VE/VCO2 slope 37, RER 1.10.  Moderate functional limitation, body habitus and HF.  - Echo (9/24): EF <20% with severe LV dilation, normal RV size/function, no significant MR.  - CPX (10/24): peak VO2 14.5, VE/VCO2 slope 33, RER 1.06.  Mild-moderate HF limitation.  - RHC (10/24): mean RA 9, PA 38/15 mean 29, mean PCWP 22, CI 1.69 Fick/2.06 thermo, PAPi 2.5.  2. HTN: angioedema with ACEI.  3. OSA: on Bipap 4. Morbid obesity 5. Paroxysmal atrial fibrillation: Urgent DCCV 6/22. 6. Smoker.  7. Anxiety/panic attacks 8. PE: 5/15, diagnosed by V/Q scan. CTA chest 8/16 negative for PE.  9. NSVT, PVCs: 30 day monitor (12/16) with PVCs, PACs, no atrial fibrillation.  - Zio patch (9/19): few short NSVT runs, no  atrial fibrillation, 1.1% PVCs 10. Hematuria: Apparently had negative workup by urology.  11. ABIs (6/16) were normal 12. 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  13. Gout 14. Low back pain.  15. 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 CKD stage 3 17. Type 2 diabetes 18. 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   Review of systems complete and found to be negative unless listed in HPI.   Current Outpatient Medications  Medication Sig Dispense Refill   amiodarone  (PACERONE ) 200 MG tablet Take 0.5 tablets (100 mg total) by mouth daily. PLEASE SCHEDULE FOLLOW UP FOR MORE RERFILLS 45 tablet 2   atorvastatin  (LIPITOR) 40 MG tablet TAKE 1 TABLET BY MOUTH EVERY DAY 90 tablet 3   dapagliflozin  propanediol (FARXIGA ) 10 MG TABS tablet Take 1 tablet (10 mg total) by mouth daily. 90 tablet 1   Lemborexant  (DAYVIGO ) 5 MG TABS Take 1 tablet (5 mg total) by mouth at bedtime as needed (insomnia). 30 tablet 1   metolazone  (ZAROXOLYN )  2.5 MG tablet Take 1 tablet (2.5 mg total) by mouth daily. 1 tablet 0   pantoprazole  (PROTONIX ) 40 MG tablet TAKE 1 TABLET (40 MG TOTAL) BY MOUTH DAILY. SCHEDULE AN APPT FOR FURTHER REFILLS 90 tablet 1   PRESCRIPTION MEDICATION Inhale into the lungs See admin instructions. Bipap, pressure 16/12 with 2L of O2 - use whenever sleeping     sildenafil  (VIAGRA ) 50 MG tablet TAKE 1 TABLET BY MOUTH AS NEEDED FOR ERECTILE DYSFUNCTION BEGIN  WITH  1  TABLET,  MAY  TAKE  2  IF  NECESSARY 15 tablet 0   spironolactone  (ALDACTONE ) 25 MG tablet TAKE 1 TABLET BY MOUTH EVERYDAY AT BEDTIME 90 tablet 3   tirzepatide  (MOUNJARO ) 2.5 MG/0.5ML Pen Inject 2.5 mg into the skin once a week. 2 mL 0   XARELTO  20 MG TABS tablet Take 1 tablet (20 mg total) by mouth daily with supper. 90 tablet 1   albuterol  (VENTOLIN  HFA) 108 (90 Base) MCG/ACT inhaler INHALE 1-2 PUFFS INTO THE LUNGS EVERY 4 (FOUR) HOURS AS NEEDED FOR SHORTNESS OF BREATH OR WHEEZING. (Patient not taking: Reported on 12/14/2024) 8.5 each 1   benzonatate  (TESSALON )  100 MG capsule Take 1 capsule (100 mg total) by mouth every 8 (eight) hours. (Patient not taking: Reported on 12/14/2024) 21 capsule 0   carvedilol  (COREG ) 6.25 MG tablet Take 1 tablet (6.25 mg total) by mouth 2 (two) times daily. 60 tablet 11   cyclobenzaprine  (FLEXERIL ) 10 MG tablet Take 1 tablet (10 mg total) by mouth 2 (two) times daily as needed for muscle spasms. 10 tablet 0   doxycycline  (VIBRAMYCIN ) 100 MG capsule Take 1 capsule (100 mg total) by mouth 2 (two) times daily. (Patient not taking: Reported on 12/14/2024) 20 capsule 0   gabapentin  (NEURONTIN ) 300 MG capsule Take 300 mg by mouth 3 (three) times daily. (Patient not taking: Reported on 12/14/2024)     HYDROcodone -acetaminophen  (NORCO/VICODIN) 5-325 MG tablet Take 1 tablet by mouth every 4 (four) hours as needed. (Patient not taking: Reported on 12/14/2024) 12 tablet 0   potassium chloride  SA (KLOR-CON  M) 20 MEQ tablet Take 4 tablets (80 mEq total) by mouth daily. 180 tablet 3   torsemide  (DEMADEX ) 20 MG tablet Take 4 tablets (80 mg total) by mouth daily. Take 2 tablets (40 mg total) by mouth every morning 200 tablet 6   traMADol  (ULTRAM -ER) 100 MG 24 hr tablet Take 1 tablet (100 mg total) by mouth daily. (Patient not taking: Reported on 12/14/2024) 90 tablet 1   traZODone  (DESYREL ) 100 MG tablet Take 0.5 tablets (50 mg total) by mouth at bedtime. (Patient not taking: Reported on 12/14/2024) 30 tablet 3   No current facility-administered medications for this encounter.   Allergies  Allergen Reactions   Ace Inhibitors Anaphylaxis and Swelling    Angioedema   Isosorbide  Anaphylaxis   Buspirone Other (See Comments)    dizziness   Doxycycline  Other (See Comments)   Bidil [Isosorb Dinitrate-Hydralazine ] Other (See Comments)    headache   Digoxin      Unspecified side effects   Digoxin  And Related     Unspecified side effects   Isosorbide  Dinitrate    Social History   Socioeconomic History   Marital status: Divorced    Spouse  name: Not on file   Number of children: 3   Years of education: Not on file   Highest education level: Not on file  Occupational History   Occupation: DISABLED  Tobacco Use   Smoking status:  Some Days    Current packs/day: 0.00    Average packs/day: 0.5 packs/day for 30.0 years (15.0 ttl pk-yrs)    Types: Cigarettes    Start date: 05/15/1990    Last attempt to quit: 05/15/2020    Years since quitting: 4.5   Smokeless tobacco: Never   Tobacco comments:    09/20/2024 Patient states when he smokes he smokes maybe 5 cigarettes     vaping - nicotine -free products  Vaping Use   Vaping status: Never Used  Substance and Sexual Activity   Alcohol use: Not Currently    Alcohol/week: 0.0 standard drinks of alcohol   Drug use: No   Sexual activity: Not Currently  Other Topics Concern   Not on file  Social History Narrative   He smokes about a pack per day and he has been smoking since he was 59 years of age.  He drinks alcohol occasionally, but he denies any illicit drug abuse.  He is presently on disability.    Lives with wife in a 2 story home.  Has 2 children.   Previously worked in office manager, last worked in 1998.   Highest level of education:  11th grade      Lives with his daughter's mother, for right now.  Recently divorced. (2024).        Social Drivers of Health   Tobacco Use: High Risk (12/14/2024)   Patient History    Smoking Tobacco Use: Some Days    Smokeless Tobacco Use: Never    Passive Exposure: Not on file  Financial Resource Strain: Low Risk (09/23/2024)   Overall Financial Resource Strain (CARDIA)    Difficulty of Paying Living Expenses: Not hard at all  Food Insecurity: No Food Insecurity (09/23/2024)   Epic    Worried About Programme Researcher, Broadcasting/film/video in the Last Year: Never true    Ran Out of Food in the Last Year: Never true  Transportation Needs: No Transportation Needs (09/23/2024)   Epic    Lack of Transportation (Medical): No    Lack of Transportation  (Non-Medical): No  Physical Activity: Inactive (09/23/2024)   Exercise Vital Sign    Days of Exercise per Week: 0 days    Minutes of Exercise per Session: 0 min  Stress: Stress Concern Present (09/23/2024)   [employer] of Occupational Health - Occupational Stress Questionnaire    Feeling of Stress: Very much  Social Connections: Socially Isolated (09/23/2024)   Social Connection and Isolation Panel    Frequency of Communication with Friends and Family: More than three times a week    Frequency of Social Gatherings with Friends and Family: Never    Attends Religious Services: Never    Database Administrator or Organizations: No    Attends Banker Meetings: Never    Marital Status: Divorced  Catering Manager Violence: Not At Risk (09/23/2024)   Epic    Fear of Current or Ex-Partner: No    Emotionally Abused: No    Physically Abused: No    Sexually Abused: No  Depression (PHQ2-9): High Risk (09/23/2024)   Depression (PHQ2-9)    PHQ-2 Score: 14  Alcohol Screen: Low Risk (09/23/2024)   Alcohol Screen    Last Alcohol Screening Score (AUDIT): 0  Housing: Unknown (09/23/2024)   Epic    Unable to Pay for Housing in the Last Year: No    Number of Times Moved in the Last Year: Not on file    Homeless in the Last Year:  No  Utilities: Not At Risk (09/23/2024)   Epic    Threatened with loss of utilities: No  Health Literacy: Adequate Health Literacy (09/23/2024)   B1300 Health Literacy    Frequency of need for help with medical instructions: Never   Family History  Problem Relation Age of Onset   Cancer Mother        brain tumor   Hypertension Mother    Diabetes Father        Deceased, 82   Heart disease Maternal Grandmother    Hypertension Other        Family History   Stroke Other        Family History   Diabetes Other        Family History   Diabetes Daughter    BP 94/60   Pulse 81   Wt (!) 164 kg (361 lb 9.6 oz)   SpO2 99%   BMI 53.40 kg/m   Wt  Readings from Last 3 Encounters:  12/14/24 (!) 164 kg (361 lb 9.6 oz)  11/17/24 (!) 161 kg (355 lb)  11/14/24 (!) 159.6 kg (351 lb 12.8 oz)   PHYSICAL EXAM: General: NAD Neck: JVP 14-16, no thyromegaly or thyroid  nodule.  Lungs: Clear to auscultation bilaterally with normal respiratory effort. CV: Nondisplaced PMI.  Heart regular S1/S2, no S3/S4, no murmur.  2+ edema to knees.  No carotid bruit.  Normal pedal pulses.  Abdomen: Soft, nontender, no hepatosplenomegaly, no distention.  Skin: Intact without lesions or rashes.  Neurologic: Alert and oriented x 3.  Psych: Normal affect. Extremities: No clubbing or cyanosis.  HEENT: Normal.   ASSESSMENT & PLAN: 1.  Chronic systolic CHF: Nonischemic cardiomyopathy. ?If ETOH has played a role (now drinks rarely). Echo (3/18) with EF 25-30%. Echo 7/19 and in 2/21 with EF 20-25%. Echo 6/22 with EF < 20%, normal RV.  Has St Jude CRT-D device now.  Echo-post CRT (8/23) showed EF < 20%, mild MR, RV normal.  CPX in 9/23 with moderate functional limitation due to HF and body habitus. Echo in 9/24 showed  EF <20% with severe LV dilation, normal RV size/function, no significant MR.  CPX in 10/24 showed mild to moderate HF limitation but RHC in 10/24 showed low output (1.69 thermo/2.05 Fick).  Medication titration has been limited by hypotension.  He was worked up for LVAD after RHC in 10/24 (not transplant candidate with weight and smoking), but he decided that he did not want to pursue as his symptoms were minimal.  Today, he is markedly volume overloaded with NYHA class IIIb symptoms.  I think that his panic attacks are PND. - He will increase torsemide  to 80 mg daily starting today.  He will take a dose of metolazone  2.5 with torsemide  today.  We are unable to get Furoscix  for him. He will take KCl 80 mEq daily while on higher dose of torsemide .  - I will arrange for RHC on Friday, I suspect that he will need to be admitted after RHC.  If CO is low, which I  suspect it will be, we will need to reconsider LVAD.   - Unable to tolerate losartan  due to low BP (off).   - He should take Coreg  6.25 mg bid. May need to stop this if CO low on RHC Friday.  - Continue spiro 25 mg daily - Continue farxiga  10 mg daily - No Entresto, ACEI with h/o angioedema.  - Headaches with Bidil, cannot take.  - He did not  tolerate digoxin .   - Insurance denied baroreceptor activation therapy.  2. Atrial fibrillation: Paroyxsmal. Emergent DCCV in 6/22. Rhythm regular on exam. < 1% burden on device check - Continue amiodarone  100 mg daily. Check LFTs, TSH, and Free T4/T3. Will need regular eye exam.  Seeing endocrinology for hyperthyroidism, has been on methimazole .  - Continue Xarelto , hold day of cath.  3. HTN: BP now low, no room to titrate GDMT as above. 4. 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  Has St Jude CRT-D device now. On amiodarone . No VT on device interrogation today. - Continue amiodarone  100 mg daily. See discussion above regarding thyroid  labs. 5. OSA: reports full compliance w/ BiPAP.  - Continue nightly. 6. PE: 04/14/2018, diagnosed by V/Q scan. Has been on Xarelto . No bleeding issues. 7. Anxiety/Panic attacks/depression: Followed by psychiatry.  Symptoms worse since he stopped meds last summer.   8. Hyperlipidemia:   - Continue atorvastatin . 9. Obesity:  Weight up 16 lbs, suspect due to volume overload.  - He is on Mounjaro . 10. Tobacco use: Smoking about 5 cigarettes daily. Quit in the past. - Discussed cessation today. 11. T2DM  - on Mounjaro  and Farxiga  12. Hyperthyroidism: Suspect related to amiodarone . - Followed by endocrinology and is on methimazole . TSH and Free T4 today. See discussion above. 14. CKD stage 3: Creatinine most recently 1.4.  - BMET today.   Followup after RHC/likely hospitalization.   I spent 41 minutes reviewing data, interviewing patient and his family, and organizing the orders/followup.   [name]   12/14/2024 "

## 2024-12-14 NOTE — H&P (View-Only) (Signed)
 "  Advanced Heart Failure Clinic Note   Primary Care: Dr. Debby Molt HF Cardiologist: Dr. Rolan   Chief complaint: CHF  HPI: [name] is a 59 y.o. male with a past medical history of NICM, EF 25-30% in March 2018, felt to be related to prior ETOH abuse. He also has a history of PE 03/2014 completed a years course of Xarelto , morbid obesity, OSA, and tobacco abuse.    He was admitted 11/12/15-11/14/15 with acute on chronic systolic CHF and palpitations. He wore a 30 day event monitor at discharge as he had frequent PVC's and questionable Afib on telemetry. Also with some NSVT, so he was started on Amiodarone , but at follow up had not started taking it. He had previously refused ICD and was seen inpatient by Dr. Kelsie who felt that his morbid obesity was a prohibitive factor.    He was seen in the clinic in April 2018. He had started drinking ETOH again. Volume status was stable, he is not an Entresto candidate due to angioedema with lisinopril. Weight was 411 pounds.    Admitted 04/12/17 with SOB, chest pain. D- dimer was 1.16, chest CT without central obstructing PE, however more peripheral and subsegmental pulmonary artery branches were not confidently evaluated due to his body habitus. He was started on a heparin  gtt for presumed PE, however VQ scan showed no PE. Troponin was elevated, peaked at 3.37. LHC showed no CAD. Echo showed an EF of 15%, grade 2 DD, no pericarditis. He was diuresed with IV lasix , and started on torsemide  20mg  at discharge. Discharge weight was 405 pounds.   Admitted 5/22 through 04/24/2018 with abdominal pain, nausea, and vomiting. 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 Did not require surgical intervention. Followed by outpatient GI.   Echo in 2/21 with EF 20-25%, severe LV dilation.   Presented 05/09/21 w/ SOB 2/2 acute CHF w ? PNA.  CTA showed multifocal ?PNA, no PE. Also in Afib w/ RVR in 150s on admit. Initially required bipap, developed hemoptysis and intubated. Treated  w/ IV Lasix . Echo 05/10/21: LVEF < 20%. RV moderately reduced. Improved and was extubated 6/13. Went into Afib w/ RVR. Refused to wear BiPAP. Developed severe agitation/hypoxia and re-intubated.  Went into VT>>VF arrest, treated w/ ACLS>>ROSC after . Developed refractory AFib again next morning requiring emergent cardioversion 05/16/21. He was transitioned to PO amiodarone . HR remained in 40-50's, metoprolol  and sildenafil  were stopped. Discharge weight 367 lbs.  St Jude CRT-D device placed in 2/23.  Echo 8/23 showed EF <20%, mild MR, RV normal.  CPX in 9/23 showed moderate functional limitation due to HF and body habitus.   Echo in 9/24 showed EF <20% with severe LV dilation, normal RV size/function, no significant MR.   CPX was done in 10/24, this showed only mild to moderate HF limitation.  However, RHC in 10/24 showed mildly elevated filling pressures and low cardiac index (1.69 Fick, 2.06 Thermo).  He had workup for LVAD (BMI too high for transplant and has not quit smoking). However, given minimal symptoms, he decided against it.  He is here today for CHF follow-up. He has been doing poorly recently.  He says that he stopped his psych meds (clonazepam  and others) last summer when he separated from his wife and since then has had a lot of anxiety.  He reports panic attacks where he will wake up at night profoundly short of breath.  These episodes actually sound like PND.  He misses his meds fairly frequently, only takes Coreg   once a day.  He has a lot of chronic pain with neuropathic-type pain in his neck/arms/hands.  He has profound orthopnea and sleeps sitting up often.  Weight is up 16 lbs even though he is still sporadically taking tirzepatide . He is using Bipap regularly.  He is short of breath walking short distances (around house) and has developed significant peripheral edema.    No ETOH or drug use. Smoking several cigarettes/day.    ECG (personally reviewed): A-BiV pacing  [employer]bbott  CRT-D device interrogation: 95% BiV pacing, 52% a-pacing, no AF, no VT, thoracic impedance trending down.   Labs (11/25): LDL 86 Labs (12/25): BNP 310, K 3.9, creatinine 1.42, LFTs normal, hb 13.8  PMH: 1. Nonischemic cardiomyopathy: Prior cath with no significant CAD.  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  Prior echoes with EF as low as 25%.  Echo (9/13) with EF 35-40%, moderate to severe LV dilation, diffuse hypokinesis, mild MR. Echo (5/15) with EF 30-35%, moderate to severe LAE, normal RV size and systolic function.  Angioedema with ACEI, headaches with hydralazine /nitrates. Echo (3/16) with EF 25-30%, severe LV dilation, normal RV size and systolic function.  [facility] 08/13/15 showed no significant coronary disease; RA mean 6, PA 33/11 mean 23, PCWP mean 13, Fick CO/CI 4.75 /1.68 (difficult study, radial artery spasm, if needs future cath would use groin).  Echo (9/16) showed EF 20-25%.   - Echo (3/18): EF 25-30%, moderate LAE - Echo 4/18 EF 15% - Echo 7/19 EF 20-25% - Echo 2/21 with EF 20-25%, severe LV dilation.  - Echo 6/22 with EF < 20%, normal RV. - St Jude CRT-D 2/23.  - Echo (8/23): EF < 20%, mild MR, RV normal.  - CPX (9/23): Peak VO2 13.1, VE/VCO2 slope 37, RER 1.10.  Moderate functional limitation, body habitus and HF.  - Echo (9/24): EF <20% with severe LV dilation, normal RV size/function, no significant MR.  - CPX (10/24): peak VO2 14.5, VE/VCO2 slope 33, RER 1.06.  Mild-moderate HF limitation.  - RHC (10/24): mean RA 9, PA 38/15 mean 29, mean PCWP 22, CI 1.69 Fick/2.06 thermo, PAPi 2.5.  2. HTN: angioedema with ACEI.  3. OSA: on Bipap 4. Morbid obesity 5. Paroxysmal atrial fibrillation: Urgent DCCV 6/22. 6. Smoker.  7. Anxiety/panic attacks 8. PE: 5/15, diagnosed by V/Q scan. CTA chest 8/16 negative for PE.  9. NSVT, PVCs: 30 day monitor (12/16) with PVCs, PACs, no atrial fibrillation.  - Zio patch (9/19): few short NSVT runs, no  atrial fibrillation, 1.1% PVCs 10. Hematuria: Apparently had negative workup by urology.  11. ABIs (6/16) were normal 12. 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  13. Gout 14. Low back pain.  15. 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 CKD stage 3 17. Type 2 diabetes 18. 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   Review of systems complete and found to be negative unless listed in HPI.   Current Outpatient Medications  Medication Sig Dispense Refill   amiodarone  (PACERONE ) 200 MG tablet Take 0.5 tablets (100 mg total) by mouth daily. PLEASE SCHEDULE FOLLOW UP FOR MORE RERFILLS 45 tablet 2   atorvastatin  (LIPITOR) 40 MG tablet TAKE 1 TABLET BY MOUTH EVERY DAY 90 tablet 3   dapagliflozin  propanediol (FARXIGA ) 10 MG TABS tablet Take 1 tablet (10 mg total) by mouth daily. 90 tablet 1   Lemborexant  (DAYVIGO ) 5 MG TABS Take 1 tablet (5 mg total) by mouth at bedtime as needed (insomnia). 30 tablet 1   metolazone  (ZAROXOLYN )  2.5 MG tablet Take 1 tablet (2.5 mg total) by mouth daily. 1 tablet 0   pantoprazole  (PROTONIX ) 40 MG tablet TAKE 1 TABLET (40 MG TOTAL) BY MOUTH DAILY. SCHEDULE AN APPT FOR FURTHER REFILLS 90 tablet 1   PRESCRIPTION MEDICATION Inhale into the lungs See admin instructions. Bipap, pressure 16/12 with 2L of O2 - use whenever sleeping     sildenafil  (VIAGRA ) 50 MG tablet TAKE 1 TABLET BY MOUTH AS NEEDED FOR ERECTILE DYSFUNCTION BEGIN  WITH  1  TABLET,  MAY  TAKE  2  IF  NECESSARY 15 tablet 0   spironolactone  (ALDACTONE ) 25 MG tablet TAKE 1 TABLET BY MOUTH EVERYDAY AT BEDTIME 90 tablet 3   tirzepatide  (MOUNJARO ) 2.5 MG/0.5ML Pen Inject 2.5 mg into the skin once a week. 2 mL 0   XARELTO  20 MG TABS tablet Take 1 tablet (20 mg total) by mouth daily with supper. 90 tablet 1   albuterol  (VENTOLIN  HFA) 108 (90 Base) MCG/ACT inhaler INHALE 1-2 PUFFS INTO THE LUNGS EVERY 4 (FOUR) HOURS AS NEEDED FOR SHORTNESS OF BREATH OR WHEEZING. (Patient not taking: Reported on 12/14/2024) 8.5 each 1   benzonatate  (TESSALON )  100 MG capsule Take 1 capsule (100 mg total) by mouth every 8 (eight) hours. (Patient not taking: Reported on 12/14/2024) 21 capsule 0   carvedilol  (COREG ) 6.25 MG tablet Take 1 tablet (6.25 mg total) by mouth 2 (two) times daily. 60 tablet 11   cyclobenzaprine  (FLEXERIL ) 10 MG tablet Take 1 tablet (10 mg total) by mouth 2 (two) times daily as needed for muscle spasms. 10 tablet 0   doxycycline  (VIBRAMYCIN ) 100 MG capsule Take 1 capsule (100 mg total) by mouth 2 (two) times daily. (Patient not taking: Reported on 12/14/2024) 20 capsule 0   gabapentin  (NEURONTIN ) 300 MG capsule Take 300 mg by mouth 3 (three) times daily. (Patient not taking: Reported on 12/14/2024)     HYDROcodone -acetaminophen  (NORCO/VICODIN) 5-325 MG tablet Take 1 tablet by mouth every 4 (four) hours as needed. (Patient not taking: Reported on 12/14/2024) 12 tablet 0   potassium chloride  SA (KLOR-CON  M) 20 MEQ tablet Take 4 tablets (80 mEq total) by mouth daily. 180 tablet 3   torsemide  (DEMADEX ) 20 MG tablet Take 4 tablets (80 mg total) by mouth daily. Take 2 tablets (40 mg total) by mouth every morning 200 tablet 6   traMADol  (ULTRAM -ER) 100 MG 24 hr tablet Take 1 tablet (100 mg total) by mouth daily. (Patient not taking: Reported on 12/14/2024) 90 tablet 1   traZODone  (DESYREL ) 100 MG tablet Take 0.5 tablets (50 mg total) by mouth at bedtime. (Patient not taking: Reported on 12/14/2024) 30 tablet 3   No current facility-administered medications for this encounter.   Allergies  Allergen Reactions   Ace Inhibitors Anaphylaxis and Swelling    Angioedema   Isosorbide  Anaphylaxis   Buspirone Other (See Comments)    dizziness   Doxycycline  Other (See Comments)   Bidil [Isosorb Dinitrate-Hydralazine ] Other (See Comments)    headache   Digoxin      Unspecified side effects   Digoxin  And Related     Unspecified side effects   Isosorbide  Dinitrate    Social History   Socioeconomic History   Marital status: Divorced    Spouse  name: Not on file   Number of children: 3   Years of education: Not on file   Highest education level: Not on file  Occupational History   Occupation: DISABLED  Tobacco Use   Smoking status:  Some Days    Current packs/day: 0.00    Average packs/day: 0.5 packs/day for 30.0 years (15.0 ttl pk-yrs)    Types: Cigarettes    Start date: 05/15/1990    Last attempt to quit: 05/15/2020    Years since quitting: 4.5   Smokeless tobacco: Never   Tobacco comments:    09/20/2024 Patient states when he smokes he smokes maybe 5 cigarettes     vaping - nicotine -free products  Vaping Use   Vaping status: Never Used  Substance and Sexual Activity   Alcohol use: Not Currently    Alcohol/week: 0.0 standard drinks of alcohol   Drug use: No   Sexual activity: Not Currently  Other Topics Concern   Not on file  Social History Narrative   He smokes about a pack per day and he has been smoking since he was 59 years of age.  He drinks alcohol occasionally, but he denies any illicit drug abuse.  He is presently on disability.    Lives with wife in a 2 story home.  Has 2 children.   Previously worked in office manager, last worked in 1998.   Highest level of education:  11th grade      Lives with his daughter's mother, for right now.  Recently divorced. (2024).        Social Drivers of Health   Tobacco Use: High Risk (12/14/2024)   Patient History    Smoking Tobacco Use: Some Days    Smokeless Tobacco Use: Never    Passive Exposure: Not on file  Financial Resource Strain: Low Risk (09/23/2024)   Overall Financial Resource Strain (CARDIA)    Difficulty of Paying Living Expenses: Not hard at all  Food Insecurity: No Food Insecurity (09/23/2024)   Epic    Worried About Programme Researcher, Broadcasting/film/video in the Last Year: Never true    Ran Out of Food in the Last Year: Never true  Transportation Needs: No Transportation Needs (09/23/2024)   Epic    Lack of Transportation (Medical): No    Lack of Transportation  (Non-Medical): No  Physical Activity: Inactive (09/23/2024)   Exercise Vital Sign    Days of Exercise per Week: 0 days    Minutes of Exercise per Session: 0 min  Stress: Stress Concern Present (09/23/2024)   [employer] of Occupational Health - Occupational Stress Questionnaire    Feeling of Stress: Very much  Social Connections: Socially Isolated (09/23/2024)   Social Connection and Isolation Panel    Frequency of Communication with Friends and Family: More than three times a week    Frequency of Social Gatherings with Friends and Family: Never    Attends Religious Services: Never    Database Administrator or Organizations: No    Attends Banker Meetings: Never    Marital Status: Divorced  Catering Manager Violence: Not At Risk (09/23/2024)   Epic    Fear of Current or Ex-Partner: No    Emotionally Abused: No    Physically Abused: No    Sexually Abused: No  Depression (PHQ2-9): High Risk (09/23/2024)   Depression (PHQ2-9)    PHQ-2 Score: 14  Alcohol Screen: Low Risk (09/23/2024)   Alcohol Screen    Last Alcohol Screening Score (AUDIT): 0  Housing: Unknown (09/23/2024)   Epic    Unable to Pay for Housing in the Last Year: No    Number of Times Moved in the Last Year: Not on file    Homeless in the Last Year:  No  Utilities: Not At Risk (09/23/2024)   Epic    Threatened with loss of utilities: No  Health Literacy: Adequate Health Literacy (09/23/2024)   B1300 Health Literacy    Frequency of need for help with medical instructions: Never   Family History  Problem Relation Age of Onset   Cancer Mother        brain tumor   Hypertension Mother    Diabetes Father        Deceased, 82   Heart disease Maternal Grandmother    Hypertension Other        Family History   Stroke Other        Family History   Diabetes Other        Family History   Diabetes Daughter    BP 94/60   Pulse 81   Wt (!) 164 kg (361 lb 9.6 oz)   SpO2 99%   BMI 53.40 kg/m   Wt  Readings from Last 3 Encounters:  12/14/24 (!) 164 kg (361 lb 9.6 oz)  11/17/24 (!) 161 kg (355 lb)  11/14/24 (!) 159.6 kg (351 lb 12.8 oz)   PHYSICAL EXAM: General: NAD Neck: JVP 14-16, no thyromegaly or thyroid  nodule.  Lungs: Clear to auscultation bilaterally with normal respiratory effort. CV: Nondisplaced PMI.  Heart regular S1/S2, no S3/S4, no murmur.  2+ edema to knees.  No carotid bruit.  Normal pedal pulses.  Abdomen: Soft, nontender, no hepatosplenomegaly, no distention.  Skin: Intact without lesions or rashes.  Neurologic: Alert and oriented x 3.  Psych: Normal affect. Extremities: No clubbing or cyanosis.  HEENT: Normal.   ASSESSMENT & PLAN: 1.  Chronic systolic CHF: Nonischemic cardiomyopathy. ?If ETOH has played a role (now drinks rarely). Echo (3/18) with EF 25-30%. Echo 7/19 and in 2/21 with EF 20-25%. Echo 6/22 with EF < 20%, normal RV.  Has St Jude CRT-D device now.  Echo-post CRT (8/23) showed EF < 20%, mild MR, RV normal.  CPX in 9/23 with moderate functional limitation due to HF and body habitus. Echo in 9/24 showed  EF <20% with severe LV dilation, normal RV size/function, no significant MR.  CPX in 10/24 showed mild to moderate HF limitation but RHC in 10/24 showed low output (1.69 thermo/2.05 Fick).  Medication titration has been limited by hypotension.  He was worked up for LVAD after RHC in 10/24 (not transplant candidate with weight and smoking), but he decided that he did not want to pursue as his symptoms were minimal.  Today, he is markedly volume overloaded with NYHA class IIIb symptoms.  I think that his panic attacks are PND. - He will increase torsemide  to 80 mg daily starting today.  He will take a dose of metolazone  2.5 with torsemide  today.  We are unable to get Furoscix  for him. He will take KCl 80 mEq daily while on higher dose of torsemide .  - I will arrange for RHC on Friday, I suspect that he will need to be admitted after RHC.  If CO is low, which I  suspect it will be, we will need to reconsider LVAD.   - Unable to tolerate losartan  due to low BP (off).   - He should take Coreg  6.25 mg bid. May need to stop this if CO low on RHC Friday.  - Continue spiro 25 mg daily - Continue farxiga  10 mg daily - No Entresto, ACEI with h/o angioedema.  - Headaches with Bidil, cannot take.  - He did not  tolerate digoxin .   - Insurance denied baroreceptor activation therapy.  2. Atrial fibrillation: Paroyxsmal. Emergent DCCV in 6/22. Rhythm regular on exam. < 1% burden on device check - Continue amiodarone  100 mg daily. Check LFTs, TSH, and Free T4/T3. Will need regular eye exam.  Seeing endocrinology for hyperthyroidism, has been on methimazole .  - Continue Xarelto , hold day of cath.  3. HTN: BP now low, no room to titrate GDMT as above. 4. 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  Has St Jude CRT-D device now. On amiodarone . No VT on device interrogation today. - Continue amiodarone  100 mg daily. See discussion above regarding thyroid  labs. 5. OSA: reports full compliance w/ BiPAP.  - Continue nightly. 6. PE: 04/14/2018, diagnosed by V/Q scan. Has been on Xarelto . No bleeding issues. 7. Anxiety/Panic attacks/depression: Followed by psychiatry.  Symptoms worse since he stopped meds last summer.   8. Hyperlipidemia:   - Continue atorvastatin . 9. Obesity:  Weight up 16 lbs, suspect due to volume overload.  - He is on Mounjaro . 10. Tobacco use: Smoking about 5 cigarettes daily. Quit in the past. - Discussed cessation today. 11. T2DM  - on Mounjaro  and Farxiga  12. Hyperthyroidism: Suspect related to amiodarone . - Followed by endocrinology and is on methimazole . TSH and Free T4 today. See discussion above. 14. CKD stage 3: Creatinine most recently 1.4.  - BMET today.   Followup after RHC/likely hospitalization.   I spent 41 minutes reviewing data, interviewing patient and his family, and organizing the orders/followup.   [name]   12/14/2024 "

## 2024-12-15 LAB — T3, FREE: T3, Free: 2.6 pg/mL (ref 2.0–4.4)

## 2024-12-16 ENCOUNTER — Other Ambulatory Visit: Payer: Self-pay

## 2024-12-16 ENCOUNTER — Encounter (HOSPITAL_COMMUNITY): Admission: RE | Disposition: A | Payer: Self-pay | Source: Home / Self Care | Attending: Cardiology

## 2024-12-16 ENCOUNTER — Inpatient Hospital Stay (HOSPITAL_COMMUNITY)

## 2024-12-16 ENCOUNTER — Telehealth: Admitting: Psychiatry

## 2024-12-16 ENCOUNTER — Telehealth: Payer: Self-pay | Admitting: Psychiatry

## 2024-12-16 ENCOUNTER — Encounter (HOSPITAL_COMMUNITY): Payer: Self-pay | Admitting: Cardiology

## 2024-12-16 ENCOUNTER — Inpatient Hospital Stay (HOSPITAL_COMMUNITY)
Admission: RE | Admit: 2024-12-16 | Discharge: 2024-12-21 | DRG: 286 | Disposition: A | Discharge status: Home / Self Care | Attending: Internal Medicine | Admitting: Internal Medicine

## 2024-12-16 DIAGNOSIS — Z86711 Personal history of pulmonary embolism: Secondary | ICD-10-CM

## 2024-12-16 DIAGNOSIS — F17210 Nicotine dependence, cigarettes, uncomplicated: Secondary | ICD-10-CM | POA: Diagnosis present

## 2024-12-16 DIAGNOSIS — I428 Other cardiomyopathies: Secondary | ICD-10-CM | POA: Diagnosis present

## 2024-12-16 DIAGNOSIS — Z8249 Family history of ischemic heart disease and other diseases of the circulatory system: Secondary | ICD-10-CM

## 2024-12-16 DIAGNOSIS — Z87892 Personal history of anaphylaxis: Secondary | ICD-10-CM

## 2024-12-16 DIAGNOSIS — N179 Acute kidney failure, unspecified: Secondary | ICD-10-CM | POA: Diagnosis not present

## 2024-12-16 DIAGNOSIS — Z833 Family history of diabetes mellitus: Secondary | ICD-10-CM

## 2024-12-16 DIAGNOSIS — G609 Hereditary and idiopathic neuropathy, unspecified: Secondary | ICD-10-CM

## 2024-12-16 DIAGNOSIS — I5023 Acute on chronic systolic (congestive) heart failure: Secondary | ICD-10-CM

## 2024-12-16 DIAGNOSIS — N1831 Chronic kidney disease, stage 3a: Secondary | ICD-10-CM | POA: Diagnosis present

## 2024-12-16 DIAGNOSIS — Z7985 Long-term (current) use of injectable non-insulin antidiabetic drugs: Secondary | ICD-10-CM

## 2024-12-16 DIAGNOSIS — F419 Anxiety disorder, unspecified: Secondary | ICD-10-CM

## 2024-12-16 DIAGNOSIS — Z6841 Body Mass Index (BMI) 40.0 and over, adult: Secondary | ICD-10-CM | POA: Diagnosis not present

## 2024-12-16 DIAGNOSIS — I472 Ventricular tachycardia, unspecified: Secondary | ICD-10-CM | POA: Diagnosis present

## 2024-12-16 DIAGNOSIS — Z4502 Encounter for adjustment and management of automatic implantable cardiac defibrillator: Secondary | ICD-10-CM

## 2024-12-16 DIAGNOSIS — I509 Heart failure, unspecified: Secondary | ICD-10-CM | POA: Diagnosis present

## 2024-12-16 DIAGNOSIS — E0590 Thyrotoxicosis, unspecified without thyrotoxic crisis or storm: Secondary | ICD-10-CM | POA: Diagnosis present

## 2024-12-16 DIAGNOSIS — F411 Generalized anxiety disorder: Secondary | ICD-10-CM | POA: Diagnosis present

## 2024-12-16 DIAGNOSIS — Z7901 Long term (current) use of anticoagulants: Secondary | ICD-10-CM

## 2024-12-16 DIAGNOSIS — Z555 Less than a high school diploma: Secondary | ICD-10-CM

## 2024-12-16 DIAGNOSIS — I110 Hypertensive heart disease with heart failure: Principal | ICD-10-CM | POA: Diagnosis present

## 2024-12-16 DIAGNOSIS — Z881 Allergy status to other antibiotic agents status: Secondary | ICD-10-CM

## 2024-12-16 DIAGNOSIS — I4901 Ventricular fibrillation: Secondary | ICD-10-CM | POA: Diagnosis present

## 2024-12-16 DIAGNOSIS — Z823 Family history of stroke: Secondary | ICD-10-CM

## 2024-12-16 DIAGNOSIS — Z888 Allergy status to other drugs, medicaments and biological substances status: Secondary | ICD-10-CM

## 2024-12-16 DIAGNOSIS — I5021 Acute systolic (congestive) heart failure: Secondary | ICD-10-CM | POA: Diagnosis not present

## 2024-12-16 DIAGNOSIS — I480 Paroxysmal atrial fibrillation: Secondary | ICD-10-CM | POA: Diagnosis present

## 2024-12-16 DIAGNOSIS — M79601 Pain in right arm: Secondary | ICD-10-CM | POA: Diagnosis not present

## 2024-12-16 DIAGNOSIS — Z91148 Patient's other noncompliance with medication regimen for other reason: Secondary | ICD-10-CM

## 2024-12-16 DIAGNOSIS — I2721 Secondary pulmonary arterial hypertension: Secondary | ICD-10-CM | POA: Diagnosis present

## 2024-12-16 DIAGNOSIS — E1122 Type 2 diabetes mellitus with diabetic chronic kidney disease: Secondary | ICD-10-CM | POA: Diagnosis present

## 2024-12-16 DIAGNOSIS — Z635 Disruption of family by separation and divorce: Secondary | ICD-10-CM

## 2024-12-16 DIAGNOSIS — Z79899 Other long term (current) drug therapy: Secondary | ICD-10-CM

## 2024-12-16 DIAGNOSIS — E1140 Type 2 diabetes mellitus with diabetic neuropathy, unspecified: Secondary | ICD-10-CM | POA: Diagnosis present

## 2024-12-16 DIAGNOSIS — Z95810 Presence of automatic (implantable) cardiac defibrillator: Principal | ICD-10-CM

## 2024-12-16 DIAGNOSIS — E785 Hyperlipidemia, unspecified: Secondary | ICD-10-CM | POA: Diagnosis present

## 2024-12-16 DIAGNOSIS — Z7984 Long term (current) use of oral hypoglycemic drugs: Secondary | ICD-10-CM

## 2024-12-16 DIAGNOSIS — G8929 Other chronic pain: Secondary | ICD-10-CM | POA: Diagnosis present

## 2024-12-16 DIAGNOSIS — G4733 Obstructive sleep apnea (adult) (pediatric): Secondary | ICD-10-CM | POA: Diagnosis present

## 2024-12-16 DIAGNOSIS — T50916A Underdosing of multiple unspecified drugs, medicaments and biological substances, initial encounter: Secondary | ICD-10-CM | POA: Diagnosis present

## 2024-12-16 DIAGNOSIS — I5022 Chronic systolic (congestive) heart failure: Secondary | ICD-10-CM

## 2024-12-16 DIAGNOSIS — Z91128 Patient's intentional underdosing of medication regimen for other reason: Secondary | ICD-10-CM

## 2024-12-16 DIAGNOSIS — Z8674 Personal history of sudden cardiac arrest: Secondary | ICD-10-CM

## 2024-12-16 DIAGNOSIS — I10 Essential (primary) hypertension: Secondary | ICD-10-CM

## 2024-12-16 DIAGNOSIS — E118 Type 2 diabetes mellitus with unspecified complications: Secondary | ICD-10-CM

## 2024-12-16 HISTORY — PX: RIGHT HEART CATH: CATH118263

## 2024-12-16 LAB — GLUCOSE, CAPILLARY
Glucose-Capillary: 196 mg/dL — ABNORMAL HIGH (ref 70–99)
Glucose-Capillary: 157 mg/dL — ABNORMAL HIGH (ref 70–99)

## 2024-12-16 LAB — POCT I-STAT EG7
Acid-Base Excess: 2 mmol/L (ref 0.0–2.0)
Bicarbonate: 27.4 mmol/L (ref 20.0–28.0)
Calcium, Ion: 1.17 mmol/L (ref 1.15–1.40)
HCT: 40 % (ref 39.0–52.0)
Hemoglobin: 13.6 g/dL (ref 13.0–17.0)
O2 Saturation: 45 %
Potassium: 3.9 mmol/L (ref 3.5–5.1)
Sodium: 141 mmol/L (ref 135–145)
TCO2: 29 mmol/L (ref 22–32)
pCO2, Ven: 43.4 mmHg — ABNORMAL LOW (ref 44–60)
pH, Ven: 7.408 (ref 7.25–7.43)
pO2, Ven: 25 mmHg — CL (ref 32–45)

## 2024-12-16 LAB — HEPATIC FUNCTION PANEL
ALT: 14 U/L (ref 0–44)
AST: 24 U/L (ref 15–41)
Albumin: 3.6 g/dL (ref 3.5–5.0)
Alkaline Phosphatase: 148 U/L — ABNORMAL HIGH (ref 38–126)
Bilirubin, Direct: 0.5 mg/dL — ABNORMAL HIGH (ref 0.0–0.2)
Indirect Bilirubin: 0.7 mg/dL (ref 0.3–0.9)
Total Bilirubin: 1.2 mg/dL (ref 0.0–1.2)
Total Protein: 8.4 g/dL — ABNORMAL HIGH (ref 6.5–8.1)

## 2024-12-16 LAB — TSH: TSH: 4.28 u[IU]/mL (ref 0.350–4.500)

## 2024-12-16 LAB — CBC
HCT: 38.3 % — ABNORMAL LOW (ref 39.0–52.0)
Hemoglobin: 13.3 g/dL (ref 13.0–17.0)
MCH: 26.4 pg (ref 26.0–34.0)
MCHC: 34.7 g/dL (ref 30.0–36.0)
MCV: 76.1 fL — ABNORMAL LOW (ref 80.0–100.0)
Platelets: 288 K/uL (ref 150–400)
RBC: 5.03 MIL/uL (ref 4.22–5.81)
RDW: 16.1 % — ABNORMAL HIGH (ref 11.5–15.5)
WBC: 8.4 K/uL (ref 4.0–10.5)
nRBC: 0 % (ref 0.0–0.2)

## 2024-12-16 LAB — BASIC METABOLIC PANEL WITH GFR
Anion gap: 15 (ref 5–15)
BUN: 25 mg/dL — ABNORMAL HIGH (ref 6–20)
CO2: 20 mmol/L — ABNORMAL LOW (ref 22–32)
Calcium: 9.1 mg/dL (ref 8.9–10.3)
Chloride: 102 mmol/L (ref 98–111)
Creatinine, Ser: 1.57 mg/dL — ABNORMAL HIGH (ref 0.61–1.24)
GFR, Estimated: 51 mL/min — ABNORMAL LOW
Glucose, Bld: 156 mg/dL — ABNORMAL HIGH (ref 70–99)
Potassium: 3.7 mmol/L (ref 3.5–5.1)
Sodium: 137 mmol/L (ref 135–145)

## 2024-12-16 LAB — HIV ANTIBODY (ROUTINE TESTING W REFLEX): HIV Screen 4th Generation wRfx: NONREACTIVE

## 2024-12-16 LAB — MAGNESIUM: Magnesium: 2 mg/dL (ref 1.7–2.4)

## 2024-12-16 LAB — PRO BRAIN NATRIURETIC PEPTIDE: Pro Brain Natriuretic Peptide: 1982 pg/mL — ABNORMAL HIGH

## 2024-12-16 MED ORDER — SODIUM CHLORIDE 0.9% FLUSH: 3.0000 mL | INTRAVENOUS | Status: DC | PRN

## 2024-12-16 MED ORDER — DAPAGLIFLOZIN PROPANEDIOL 10 MG PO TABS
10.0000 mg | ORAL_TABLET | Freq: Every day | ORAL | Status: DC
  Administered 2024-12-16 – 2024-12-21 (×6): 10 mg via ORAL
  Filled 2024-12-16 (×6): qty 1

## 2024-12-16 MED ORDER — RIVAROXABAN 20 MG PO TABS
20.0000 mg | ORAL_TABLET | Freq: Every day | ORAL | Status: DC
  Administered 2024-12-16 – 2024-12-20 (×5): 20 mg via ORAL
  Filled 2024-12-16 (×5): qty 1

## 2024-12-16 MED ORDER — SODIUM CHLORIDE 0.9 % IV SOLN: 250.0000 mL | INTRAVENOUS | Status: DC | PRN

## 2024-12-16 MED ORDER — MIDAZOLAM HCL 2 MG/2ML IJ SOLN
INTRAMUSCULAR | Status: AC
  Filled 2024-12-16: qty 2

## 2024-12-16 MED ORDER — AMIODARONE HCL 100 MG PO TABS
100.0000 mg | ORAL_TABLET | Freq: Every day | ORAL | Status: DC
  Administered 2024-12-16 – 2024-12-21 (×6): 100 mg via ORAL
  Filled 2024-12-16 (×6): qty 1

## 2024-12-16 MED ORDER — MIDAZOLAM HCL (PF) 2 MG/2ML IJ SOLN
INTRAMUSCULAR | Status: DC | PRN
  Administered 2024-12-16: 1 mg via INTRAVENOUS

## 2024-12-16 MED ORDER — FENTANYL CITRATE (PF) 100 MCG/2ML IJ SOLN
INTRAMUSCULAR | Status: DC | PRN
  Administered 2024-12-16: 25 ug via INTRAVENOUS

## 2024-12-16 MED ORDER — FREE WATER: 250.0000 mL | Freq: Once | Status: DC

## 2024-12-16 MED ORDER — ATORVASTATIN CALCIUM 40 MG PO TABS
40.0000 mg | ORAL_TABLET | Freq: Every day | ORAL | Status: DC
  Administered 2024-12-16 – 2024-12-21 (×6): 40 mg via ORAL
  Filled 2024-12-16 (×6): qty 1

## 2024-12-16 MED ORDER — MILRINONE LACTATE IN DEXTROSE 20-5 MG/100ML-% IV SOLN
0.1250 ug/kg/min | INTRAVENOUS | Status: DC
  Administered 2024-12-16 – 2024-12-19 (×9): 0.25 ug/kg/min via INTRAVENOUS
  Administered 2024-12-19 – 2024-12-20 (×3): 0.125 ug/kg/min via INTRAVENOUS
  Filled 2024-12-16 (×12): qty 100

## 2024-12-16 MED ORDER — SPIRONOLACTONE 25 MG PO TABS
25.0000 mg | ORAL_TABLET | Freq: Every day | ORAL | Status: DC
  Administered 2024-12-16 – 2024-12-21 (×6): 25 mg via ORAL
  Filled 2024-12-16 (×6): qty 1

## 2024-12-16 MED ORDER — METHIMAZOLE 2.5 MG HALF TABLET: 2.5000 mg | ORAL_TABLET | Freq: Every day | ORAL | Status: DC

## 2024-12-16 MED ORDER — LIDOCAINE HCL (PF) 1 % IJ SOLN
INTRAMUSCULAR | Status: DC | PRN
  Administered 2024-12-16: 4 mL

## 2024-12-16 MED ORDER — SODIUM CHLORIDE 0.9% FLUSH
3.0000 mL | Freq: Two times a day (BID) | INTRAVENOUS | Status: DC
  Administered 2024-12-16 – 2024-12-21 (×8): 3 mL via INTRAVENOUS

## 2024-12-16 MED ORDER — FENTANYL CITRATE (PF) 100 MCG/2ML IJ SOLN
INTRAMUSCULAR | Status: AC
  Filled 2024-12-16: qty 2

## 2024-12-16 MED ORDER — HEPARIN (PORCINE) IN NACL 1000-0.9 UT/500ML-% IV SOLN
INTRAVENOUS | Status: DC | PRN
  Administered 2024-12-16: 500 mL

## 2024-12-16 MED ORDER — ONDANSETRON HCL 4 MG/2ML IJ SOLN: 4.0000 mg | Freq: Four times a day (QID) | INTRAMUSCULAR | Status: DC | PRN

## 2024-12-16 MED ORDER — POTASSIUM CHLORIDE CRYS ER 20 MEQ PO TBCR
40.0000 meq | EXTENDED_RELEASE_TABLET | ORAL | Status: AC
  Administered 2024-12-16 (×3): 40 meq via ORAL
  Filled 2024-12-16 (×2): qty 2

## 2024-12-16 MED ORDER — PANTOPRAZOLE SODIUM 40 MG PO TBEC
40.0000 mg | DELAYED_RELEASE_TABLET | Freq: Every day | ORAL | Status: DC
  Administered 2024-12-16 – 2024-12-21 (×6): 40 mg via ORAL
  Filled 2024-12-16 (×6): qty 1

## 2024-12-16 MED ORDER — FUROSEMIDE 10 MG/ML IJ SOLN
INTRAMUSCULAR | Status: AC
  Administered 2024-12-16: 80 mg via INTRAVENOUS
  Filled 2024-12-16: qty 8

## 2024-12-16 MED ORDER — METOLAZONE 2.5 MG PO TABS
2.5000 mg | ORAL_TABLET | Freq: Once | ORAL | Status: AC
  Administered 2024-12-16: 2.5 mg via ORAL
  Filled 2024-12-16: qty 1

## 2024-12-16 MED ORDER — CYCLOBENZAPRINE HCL 10 MG PO TABS
10.0000 mg | ORAL_TABLET | Freq: Two times a day (BID) | ORAL | Status: DC | PRN
  Administered 2024-12-16 – 2024-12-21 (×7): 10 mg via ORAL
  Filled 2024-12-16 (×8): qty 1

## 2024-12-16 MED ORDER — TRAZODONE HCL 50 MG PO TABS
50.0000 mg | ORAL_TABLET | Freq: Every day | ORAL | Status: DC
  Administered 2024-12-16 – 2024-12-17 (×2): 50 mg via ORAL
  Filled 2024-12-16 (×4): qty 1

## 2024-12-16 MED ORDER — MELATONIN 5 MG PO TABS
5.0000 mg | ORAL_TABLET | Freq: Every evening | ORAL | Status: DC | PRN
  Administered 2024-12-16 – 2024-12-19 (×4): 5 mg via ORAL
  Filled 2024-12-16 (×4): qty 1

## 2024-12-16 MED ORDER — POTASSIUM CHLORIDE CRYS ER 20 MEQ PO TBCR
EXTENDED_RELEASE_TABLET | ORAL | Status: AC
  Filled 2024-12-16: qty 2

## 2024-12-16 MED ORDER — SODIUM CHLORIDE 0.9% FLUSH
3.0000 mL | Freq: Two times a day (BID) | INTRAVENOUS | Status: DC
  Administered 2024-12-16: 3 mL via INTRAVENOUS

## 2024-12-16 MED ORDER — FUROSEMIDE 10 MG/ML IJ SOLN
20.0000 mg/h | INTRAVENOUS | Status: DC
  Administered 2024-12-16 – 2024-12-17 (×2): 15 mg/h via INTRAVENOUS
  Administered 2024-12-17: 20 mg/h via INTRAVENOUS
  Administered 2024-12-17: 15 mg/h via INTRAVENOUS
  Administered 2024-12-18: 20 mg/h via INTRAVENOUS
  Filled 2024-12-16 (×2): qty 200
  Filled 2024-12-16: qty 20
  Filled 2024-12-16 (×2): qty 200
  Filled 2024-12-16: qty 20

## 2024-12-16 MED ORDER — ACETAMINOPHEN 325 MG PO TABS
650.0000 mg | ORAL_TABLET | ORAL | Status: DC | PRN
  Administered 2024-12-16 – 2024-12-21 (×5): 650 mg via ORAL
  Filled 2024-12-16 (×5): qty 2

## 2024-12-16 MED ORDER — SODIUM CHLORIDE 0.9 % IV SOLN: 250.0000 mL | INTRAVENOUS | Status: AC | PRN

## 2024-12-16 MED ORDER — FUROSEMIDE 10 MG/ML IJ SOLN: 80.0000 mg | Freq: Two times a day (BID) | INTRAMUSCULAR | Status: DC

## 2024-12-16 MED ORDER — LIDOCAINE HCL (PF) 1 % IJ SOLN
INTRAMUSCULAR | Status: AC
  Filled 2024-12-16: qty 30

## 2024-12-16 MED ORDER — MAGNESIUM SULFATE 2 GM/50ML IV SOLN
2.0000 g | Freq: Once | INTRAVENOUS | Status: AC
  Administered 2024-12-16: 2 g via INTRAVENOUS
  Filled 2024-12-16: qty 50

## 2024-12-16 NOTE — Progress Notes (Signed)
 Peripherally Inserted Central Catheter Placement  The IV Nurse has discussed with the patient and/or persons authorized to consent for the patient, the purpose of this procedure and the potential benefits and risks involved with this procedure.  The benefits include less needle sticks, lab draws from the catheter, and the patient may be discharged home with the catheter. Risks include, but not limited to, infection, bleeding, blood clot (thrombus formation), and puncture of an artery; nerve damage and irregular heartbeat and possibility to perform a PICC exchange if needed/ordered by physician.  Alternatives to this procedure were also discussed.  Bard Power PICC patient education guide, fact sheet on infection prevention and patient information card has been provided to patient /or left at bedside.   Due to sedation, telephone consent obtained at bedside by patient's mother.    PICC placement unsuccessful. Limited veins, left arm restricted due to pacer. Dr. Rolan and primary nurse notified.        Hobert Benders 12/16/2024, 6:11 PM

## 2024-12-16 NOTE — Progress Notes (Signed)
 VAD coordinator met with pt today to discuss LVAD again. Pt is adamant that he does not want VAD or re evaluation for VAD. Pt states that if he changes his mind he will let Dr Rolan know. VAD coordinator discussed this with Dr Mclean.  Lauraine Ip RN, BSN VAD Coordinator 24/7 Pager [phone]

## 2024-12-16 NOTE — Progress Notes (Signed)
 Heart Failure Navigator Progress Note  Assessed for Heart & Vascular TOC clinic readiness.  Patient does not meet criteria due to established with AHF clinic, patient of Dr. Rolan.   Will sign off.   Duwaine Plant, PharmD, BCPS Heart Failure Stewardship Pharmacist Phone [phone]

## 2024-12-16 NOTE — Interval H&P Note (Signed)
 History and Physical Interval Note:  12/16/2024 8:07 AM  [name] Silence  has presented today for surgery, with the diagnosis of heart failure.  The various methods of treatment have been discussed with the patient and family. After consideration of risks, benefits and other options for treatment, the patient has consented to  Procedures: RIGHT HEART CATH (N/A) as a surgical intervention.  The patient's history has been reviewed, patient examined, no change in status, stable for surgery.  I have reviewed the patient's chart and labs.  Questions were answered to the patient's satisfaction.     Nera Haworth [employer]

## 2024-12-16 NOTE — Progress Notes (Signed)
 Echo attempted, patient moving to another location.

## 2024-12-16 NOTE — Progress Notes (Signed)
 Attempted PICC placement per order, unsuccessful. Only one vein to right arm noted, and unable to use left arm due to pacer/AICD. Vein clamped down and patient not tolerating it well.R basilic and cephalic veins are not visible via US . No other option to pursue for PICC at this time. Please refer to IR or place CVC on neck or chest. RN aware.

## 2024-12-16 NOTE — Telephone Encounter (Signed)
 Patient confirmed and verbalized understanding he will speak with provider at apt about medications as he is in the hospital a the moment.

## 2024-12-16 NOTE — Telephone Encounter (Signed)
 Please contact him to ask about his concerns regarding the medication changes. Please let him know that we will plan to discuss it at his next visit, especially since he is currently under the care of the provider while in the hospital. If he has any urgent concerns, please advise him to speak directly with his care team in the hospital.

## 2024-12-16 NOTE — Progress Notes (Signed)
 Orthopedic Tech Progress Note Patient Details:  [name] 08/29/1966 [id number]  Patient ID: [name] Silence, male   DOB: 03-07-1966, 58 y.o.   MRN: [id number] Order received for unna boot application.  Await transfer to room in order to contact nursing for application of unna boot. Glendine Swetz OTR/L 12/16/2024, 1:47 PM

## 2024-12-16 NOTE — H&P (Signed)
 "   Advanced Heart Failure Team History and Physical Note   PCP:  Joshua Debby CROME, MD  PCP-Cardiology: Wilbert Bihari, MD     Reason for Admission: CHF HPI:    [name] is a 59 y.o. male with a past medical history of NICM, EF 25-30% in March 2018, felt to be related to prior ETOH abuse. He also has a history of PE 03/2014 completed a years course of Xarelto , morbid obesity, OSA, and tobacco abuse.    He was admitted 11/12/15-11/14/15 with acute on chronic systolic CHF and palpitations. He wore a 30 day event monitor at discharge as he had frequent PVC's and questionable Afib on telemetry. Also with some NSVT, so he was started on Amiodarone , but at follow up had not started taking it. He had previously refused ICD and was seen inpatient by Dr. Kelsie who felt that his morbid obesity was a prohibitive factor.    He was seen in the clinic in April 2018. He had started drinking ETOH again. Volume status was stable, he is not an Entresto candidate due to angioedema with lisinopril. Weight was 411 pounds.    Admitted 04/12/17 with SOB, chest pain. D- dimer was 1.16, chest CT without central obstructing PE, however more peripheral and subsegmental pulmonary artery branches were not confidently evaluated due to his body habitus. He was started on a heparin  gtt for presumed PE, however VQ scan showed no PE. Troponin was elevated, peaked at 3.37. LHC showed no CAD. Echo showed an EF of 15%, grade 2 DD, no pericarditis. He was diuresed with IV lasix , and started on torsemide  20mg  at discharge. Discharge weight was 405 pounds.    Admitted 5/22 through 04/24/2018 with abdominal pain, nausea, and vomiting. 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 Did not require surgical intervention. Followed by outpatient GI.    Echo in 2/21 with EF 20-25%, severe LV dilation.    Presented 05/09/21 w/ SOB 2/2 acute CHF w ? PNA.  CTA showed multifocal ?PNA, no PE. Also in Afib w/ RVR in 150s on admit. Initially required bipap, developed  hemoptysis and intubated. Treated w/ IV Lasix . Echo 05/10/21: LVEF < 20%. RV moderately reduced. Improved and was extubated 6/13. Went into Afib w/ RVR. Refused to wear BiPAP. Developed severe agitation/hypoxia and re-intubated.  Went into VT>>VF arrest, treated w/ ACLS>>ROSC after . Developed refractory AFib again next morning requiring emergent cardioversion 05/16/21. He was transitioned to PO amiodarone . HR remained in 40-50's, metoprolol  and sildenafil  were stopped. Discharge weight 367 lbs.   St Jude CRT-D device placed in 2/23.  Echo 8/23 showed EF <20%, mild MR, RV normal.  CPX in 9/23 showed moderate functional limitation due to HF and body habitus.    Echo in 9/24 showed EF <20% with severe LV dilation, normal RV size/function, no significant MR.    CPX was done in 10/24, this showed only mild to moderate HF limitation.  However, RHC in 10/24 showed mildly elevated filling pressures and low cardiac index (1.69 Fick, 2.06 Thermo).  He had workup for LVAD (BMI too high for transplant and has not quit smoking). However, given minimal symptoms, he decided against it.   I saw him earlier this week in clinic. He has been doing poorly recently.  He says that he stopped his psych meds (clonazepam  and others) last summer when he separated from his wife and since then has had a lot of anxiety.  He reports panic attacks where he will wake up at night profoundly short  of breath.  These episodes actually sound like PND.  He misses his meds fairly frequently, only takes Coreg  once a day.  He has a lot of chronic pain with neuropathic-type pain in his neck/arms/hands.  He has profound orthopnea and sleeps sitting up often.  Weight is up 16 lbs even though he is still sporadically taking tirzepatide . He is using Bipap regularly.  He is short of breath walking short distances (around house) and has developed significant peripheral edema.     No ETOH or drug use. Smoking several cigarettes/day.    I increased  his torsemide  to 80 mg daily and wanted him to get metolazone .  His pharmacy apparently had no metolazone  in stock.  He says he did not urinate much more with increased torsemide . He came to the hospital today for RHC:  Hemodynamics (mmHg) RA mean 18 RV 75/23 PA 83/40, mean 54 PCWP mean 41 Oxygen  saturations: PA 46% AO 95% Cardiac Output (Fick) 3.99  Cardiac Index (Fick) 1.5 PVR 3.25 WU Cardiac Output (Thermo) 4.21 Cardiac Index (Thermo) 1.59 PVR 3.09 WU PAPi 2.4    Home Medications Prior to Admission medications  Medication Sig Start Date End Date Taking? Authorizing Provider  amiodarone  (PACERONE ) 200 MG tablet Take 0.5 tablets (100 mg total) by mouth daily. PLEASE SCHEDULE FOLLOW UP FOR MORE RERFILLS 08/09/24  Yes Rolan Ezra RAMAN, MD  atorvastatin  (LIPITOR) 40 MG tablet TAKE 1 TABLET BY MOUTH EVERY DAY 09/30/24  Yes [place], [place], FNP  carvedilol  (COREG ) 6.25 MG tablet Take 1 tablet (6.25 mg total) by mouth 2 (two) times daily. 12/14/24  Yes Rolan Ezra RAMAN, MD  cyclobenzaprine  (FLEXERIL ) 10 MG tablet Take 1 tablet (10 mg total) by mouth 2 (two) times daily as needed for muscle spasms. 12/14/24  Yes Rolan Ezra RAMAN, MD  dapagliflozin  propanediol (FARXIGA ) 10 MG TABS tablet Take 1 tablet (10 mg total) by mouth daily. 10/05/24  Yes Rolan Ezra RAMAN, MD  DOXEPIN  HCL PO Take 6 mg by mouth at bedtime as needed (sleep).   Yes [provider]  Multiple Vitamins-Minerals (MULTIVITAMIN ADULTS 50+ PO) Take 1 tablet by mouth daily.   Yes [provider]  naproxen  sodium (ALEVE ) 220 MG tablet Take 440 mg by mouth daily as needed.   Yes [provider]  pantoprazole  (PROTONIX ) 40 MG tablet TAKE 1 TABLET (40 MG TOTAL) BY MOUTH DAILY. SCHEDULE AN APPT FOR FURTHER REFILLS 08/25/24  Yes Joshua Debby CROME, MD  potassium chloride  SA (KLOR-CON  M) 20 MEQ tablet Take 4 tablets (80 mEq total) by mouth daily. 12/14/24  Yes Rolan Ezra RAMAN, MD  PRESCRIPTION MEDICATION Inhale into  the lungs See admin instructions. Bipap, pressure 16/12 with 2L of O2 - use whenever sleeping   Yes [provider]  spironolactone  (ALDACTONE ) 25 MG tablet TAKE 1 TABLET BY MOUTH EVERYDAY AT BEDTIME Patient taking differently: Take 25 mg by mouth daily. 08/15/24  Yes Colletta Manuelita Garre, PA-C  tirzepatide  (MOUNJARO ) 2.5 MG/0.5ML Pen Inject 2.5 mg into the skin once a week. 10/18/24  Yes Joshua Debby CROME, MD  torsemide  (DEMADEX ) 20 MG tablet Take 4 tablets (80 mg total) by mouth daily. 12/15/24  Yes Rolan Ezra RAMAN, MD  XARELTO  20 MG TABS tablet Take 1 tablet (20 mg total) by mouth daily with supper. 10/18/24  Yes Joshua Debby CROME, MD  albuterol  (VENTOLIN  HFA) 108 (90 Base) MCG/ACT inhaler INHALE 1-2 PUFFS INTO THE LUNGS EVERY 4 (FOUR) HOURS AS NEEDED FOR SHORTNESS OF BREATH OR WHEEZING.  Patient not taking: No sig reported 06/04/22   Rolan Ezra RAMAN, MD  benzonatate  (TESSALON ) 100 MG capsule Take 1 capsule (100 mg total) by mouth every 8 (eight) hours. Patient not taking: No sig reported 11/07/24   Rogelia Jerilynn RAMAN, MD  doxycycline  (VIBRAMYCIN ) 100 MG capsule Take 1 capsule (100 mg total) by mouth 2 (two) times daily. Patient not taking: No sig reported 11/07/24   Rogelia Jerilynn RAMAN, MD  gabapentin  (NEURONTIN ) 300 MG capsule Take 300 mg by mouth 3 (three) times daily. Patient not taking: No sig reported 09/02/24   [provider]  Lemborexant  (DAYVIGO ) 5 MG TABS Take 1 tablet (5 mg total) by mouth at bedtime as needed (insomnia). Patient not taking: Reported on 12/15/2024 11/09/24 01/08/25  Vickey Mettle, MD  metolazone  (ZAROXOLYN ) 2.5 MG tablet Take 1 tablet (2.5 mg total) by mouth daily. Patient not taking: Reported on 12/15/2024 12/14/24 03/14/25  Rolan Ezra RAMAN, MD  sildenafil  (VIAGRA ) 50 MG tablet TAKE 1 TABLET BY MOUTH AS NEEDED FOR ERECTILE DYSFUNCTION BEGIN  WITH  1  TABLET,  MAY  TAKE  2  IF  NECESSARY Patient not taking: Reported on 12/15/2024 10/25/24   Rolan Ezra RAMAN, MD   traMADol  (ULTRAM -ER) 100 MG 24 hr tablet Take 1 tablet (100 mg total) by mouth daily. Patient not taking: No sig reported 10/18/24   Joshua Debby CROME, MD  traZODone  (DESYREL ) 100 MG tablet Take 0.5 tablets (50 mg total) by mouth at bedtime. Patient not taking: No sig reported 11/30/24   Reddy, Pallavi D, MD  traZODone  (DESYREL ) 50 MG tablet Take 50 mg by mouth at bedtime. Patient not taking: Reported on 12/15/2024 12/13/24   [provider]  pravastatin  (PRAVACHOL ) 40 MG tablet Take 40 mg by mouth daily.  02/20/12  [provider]    Past Medical History: Past Medical History:  Diagnosis Date   Alcohol abuse    Anxiety state, unspecified    Atrial fibrillation (HCC)    CHF (congestive heart failure) (HCC)    Chronic systolic heart failure (HCC)    Diabetes mellitus, type II (HCC)    Edema    Gout    History of medication noncompliance    Migraine    Obesity, unspecified    Obstructive sleep apnea    Psychiatric disorder    Pulmonary embolism (HCC)    Shortness of breath     Past Surgical History: Past Surgical History:  Procedure Laterality Date   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   Procedure: BIV ICD INSERTION CRT-D;  Surgeon: Cindie Ole DASEN, MD;  Location: [facility] INVASIVE CV LAB;  Service: Cardiovascular;  Laterality: N/A;   CARDIAC CATHETERIZATION     CARDIAC CATHETERIZATION N/A 08/13/2015   Procedure: Right/Left Heart Cath and Coronary Angiography;  Surgeon: Ezra RAMAN Rolan, MD;  Location: [facility] INVASIVE CV LAB;  Service: Cardiovascular;  Laterality: N/A;   PACEMAKER INSERTION     RIGHT HEART CATH N/A 09/15/2023   Procedure: RIGHT HEART CATH;  Surgeon: Rolan Ezra RAMAN, MD;  Location: [facility] INVASIVE CV LAB;  Service: Cardiovascular;  Laterality: N/A;   RIGHT/LEFT HEART CATH AND CORONARY ANGIOGRAPHY N/A 04/14/2017   Procedure: Right/Left Heart Cath and Coronary Angiography;  Surgeon: Rolan Ezra RAMAN, MD;  Location: [facility] INVASIVE CV LAB;  Service:  Cardiovascular;  Laterality: N/A;   TESTICLE SURGERY      Family History:  Family History  Problem Relation Age of Onset   Cancer Mother  brain tumor   Hypertension Mother    Diabetes Father        Deceased, 55   Heart disease Maternal Grandmother    Hypertension Other        Family History   Stroke Other        Family History   Diabetes Other        Family History   Diabetes Daughter     Social History: Social History   Socioeconomic History   Marital status: Divorced    Spouse name: Not on file   Number of children: 3   Years of education: Not on file   Highest education level: Not on file  Occupational History   Occupation: DISABLED  Tobacco Use   Smoking status: Some Days    Current packs/day: 0.00    Average packs/day: 0.5 packs/day for 30.0 years (15.0 ttl pk-yrs)    Types: Cigarettes    Start date: 05/15/1990    Last attempt to quit: 05/15/2020    Years since quitting: 4.5   Smokeless tobacco: Never   Tobacco comments:    09/20/2024 Patient states when he smokes he smokes maybe 5 cigarettes     vaping - nicotine -free products  Vaping Use   Vaping status: Never Used  Substance and Sexual Activity   Alcohol use: Not Currently    Alcohol/week: 0.0 standard drinks of alcohol   Drug use: No   Sexual activity: Not Currently  Other Topics Concern   Not on file  Social History Narrative   He smokes about a pack per day and he has been smoking since he was 59 years of age.  He drinks alcohol occasionally, but he denies any illicit drug abuse.  He is presently on disability.    Lives with wife in a 2 story home.  Has 2 children.   Previously worked in office manager, last worked in 1998.   Highest level of education:  11th grade      Lives with his daughter's mother, for right now.  Recently divorced. (2024).        Social Drivers of Health   Tobacco Use: High Risk (12/14/2024)   Patient History    Smoking Tobacco Use: Some Days    Smokeless Tobacco Use:  Never    Passive Exposure: Not on file  Financial Resource Strain: Low Risk (09/23/2024)   Overall Financial Resource Strain (CARDIA)    Difficulty of Paying Living Expenses: Not hard at all  Food Insecurity: No Food Insecurity (09/23/2024)   Epic    Worried About Programme Researcher, Broadcasting/film/video in the Last Year: Never true    Ran Out of Food in the Last Year: Never true  Transportation Needs: No Transportation Needs (09/23/2024)   Epic    Lack of Transportation (Medical): No    Lack of Transportation (Non-Medical): No  Physical Activity: Inactive (09/23/2024)   Exercise Vital Sign    Days of Exercise per Week: 0 days    Minutes of Exercise per Session: 0 min  Stress: Stress Concern Present (09/23/2024)   [employer] of Occupational Health - Occupational Stress Questionnaire    Feeling of Stress: Very much  Social Connections: Socially Isolated (09/23/2024)   Social Connection and Isolation Panel    Frequency of Communication with Friends and Family: More than three times a week    Frequency of Social Gatherings with Friends and Family: Never    Attends Religious Services: Never    Database Administrator or Organizations:  No    Attends Club or Organization Meetings: Never    Marital Status: Divorced  Depression (PHQ2-9): High Risk (09/23/2024)   Depression (PHQ2-9)    PHQ-2 Score: 14  Alcohol Screen: Low Risk (09/23/2024)   Alcohol Screen    Last Alcohol Screening Score (AUDIT): 0  Housing: Unknown (09/23/2024)   Epic    Unable to Pay for Housing in the Last Year: No    Number of Times Moved in the Last Year: Not on file    Homeless in the Last Year: No  Utilities: Not At Risk (09/23/2024)   Epic    Threatened with loss of utilities: No  Health Literacy: Adequate Health Literacy (09/23/2024)   B1300 Health Literacy    Frequency of need for help with medical instructions: Never    Allergies:  Allergies[1]  Objective:    Vital Signs:   Temp:  [98.2 F (36.8 C)-98.3 F  (36.8 C)] 98.2 F (36.8 C) (01/16 0900) Pulse Rate:  [75-93] 81 (01/16 1215) Resp:  [15-33] 24 (01/16 1215) BP: (99-121)/(55-99) 99/65 (01/16 1215) SpO2:  [92 %-97 %] 96 % (01/16 1215) Weight:  [163.7 kg] 163.7 kg (01/16 0609)   Filed Weights   12/16/24 0609  Weight: (!) 163.7 kg    Physical Exam    General: NAD Neck: JVP 16 cm, no thyromegaly or thyroid  nodule.  Lungs: Clear to auscultation bilaterally with normal respiratory effort. CV: Nondisplaced PMI.  Heart regular S1/S2, no S3/S4, no murmur.  2+ edema to thighs.  No carotid bruit.  Normal pedal pulses.  Abdomen: Soft, nontender, no hepatosplenomegaly, no distention.  Skin: Intact without lesions or rashes.  Neurologic: Alert and oriented x 3.  Psych: Normal affect. Extremities: No clubbing or cyanosis.  HEENT: Normal.   Telemetry   A-BiV pacing  EKG   Pending  Labs  Basic Metabolic Panel: Recent Labs  Lab 12/14/24 1422 12/16/24 0827 12/16/24 1042  NA 137 141 137  K 4.2 3.9 3.7  CL 103  --  102  CO2 22  --  20*  GLUCOSE 118*  --  156*  BUN 22*  --  25*  CREATININE 1.40*  --  1.57*  CALCIUM  9.0  --  9.1  MG  --   --  2.0    Liver Function Tests: Recent Labs  Lab 12/14/24 1422 12/16/24 1042  AST 19 24  ALT 15 14  ALKPHOS 143* 148*  BILITOT 0.8 1.2  PROT 8.0 8.4*  ALBUMIN  3.7 3.6   No results for input(s): LIPASE, AMYLASE in the last 168 hours. No results for input(s): AMMONIA in the last 168 hours.  CBC: Recent Labs  Lab 12/16/24 0636 12/16/24 0827  WBC 8.4  --   HGB 13.3 13.6  HCT 38.3* 40.0  MCV 76.1*  --   PLT 288  --     Cardiac Enzymes: No results for input(s): CKTOTAL, CKMB, CKMBINDEX, TROPONINI in the last 168 hours.  BNP: BNP (last 3 results) Recent Labs    06/30/24 1134 09/19/24 1325 11/07/24 1426  BNP 193.1* 342.3* 310.2*    ProBNP (last 3 results) Recent Labs    12/14/24 1422 12/16/24 1042  PROBNP 1,569.0* 1,982.0*     CBG: Recent Labs   Lab 12/16/24 0634  GLUCAP 196*    Coagulation Studies: No results for input(s): LABPROT, INR in the last 72 hours.  Imaging: CARDIAC CATHETERIZATION Result Date: 12/16/2024 1. Markedly elevated left and right heart filling pressures. 2. 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 3. Low cardiac output.   US  EKG SITE RITE Result Date: 12/16/2024 If Site Rite image not attached, placement could not be confirmed due to current cardiac rhythm.   Assessment/Plan   1.  Acute on chronic systolic CHF with low output: Nonischemic cardiomyopathy. ?If ETOH has played a role (now drinks rarely). Echo (3/18) with EF 25-30%. Echo 7/19 and in 2/21 with EF 20-25%. Echo 6/22 with EF < 20%, normal RV.  Has St Jude CRT-D device now.  Echo-post CRT (8/23) showed EF < 20%, mild MR, RV normal.  CPX in 9/23 with moderate functional limitation due to HF and body habitus. Echo in 9/24 showed  EF <20% with severe LV dilation, normal RV size/function, no significant MR.  CPX in 10/24 showed mild to moderate HF limitation but RHC in 10/24 showed low output (1.69 thermo/2.05 Fick).  Medication titration has been limited by hypotension.  He was worked up for LVAD after RHC in 10/24 (not transplant candidate with weight and smoking), but he decided that he did not want to pursue as his symptoms were minimal.  Today, he is markedly volume overloaded with NYHA class IIIb symptoms.  I think that his panic attacks are PND.  RHC today showed severely elevated left and right heart filling pressures and low cardiac output.  - Admit today, start milrinone  0.25 mcg/kg/min - Will place PICC and follow CVP and co-ox.  - Give Lasix  80 mg IV x 1 then start gtt 15 mg/hr.  Will get dose of metolazone  today as well.    - Unable to tolerate losartan  due to low BP (off).   - Stop Coreg  with volume overload and low output.  - Continue spiro 25 mg daily - Continue farxiga  10 mg  daily - No Entresto, ACEI with h/o angioedema.  - Headaches with Bidil, cannot take.  - He did not tolerate digoxin .   - Insurance denied baroreceptor activation therapy.  - Will need full diuresis then again will need to consider LVAD.  His weight and social situation will make him a difficult candidate, but I think he has end stage heart failure at this point.  VAD coordinators will see this admission.  2. Atrial fibrillation: Paroyxsmal. Emergent DCCV in 6/22. Rhythm regular on exam. < 1% burden on device check - Continue amiodarone  100 mg daily. Seeing endocrinology for hyperthyroidism, now off methimazole .  - Continue Xarelto  for now.  3. HTN: BP now low, no room to titrate GDMT as above. 4. 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  Has St Jude CRT-D device now. On amiodarone . No VT on device interrogation today. - Continue amiodarone  100 mg daily.  5. OSA: reports full compliance w/ BiPAP.  - Continue nightly. 6. PE: 04/14/2018, diagnosed by V/Q scan. Has been on Xarelto . No bleeding issues. 7. Anxiety/Panic attacks/depression: Followed by psychiatry.  Symptoms worse since he stopped meds last summer but suspect panic attacks are more likely PND from volume overload.   8. Hyperlipidemia:   - Continue atorvastatin . 9. Obesity:  Weight up 16 lbs, suspect due to volume overload.  - He is on Mounjaro . 10. Tobacco use: Smoking about 5 cigarettes daily. Quit in the past. - Discussed cessation today. 11. T2DM  - on Mounjaro  and Farxiga  12. Hyperthyroidism: Suspect related to amiodarone .  Followed by endocrinology but now off methimazole .  TSH mildly elevated with normal free T3 and free T4 on recent check.   13. CKD stage 3: Creatinine 1.5 today  which appears to be baseline.   Ezra Shuck, MD 12/16/2024, 12:33 PM  Advanced Heart Failure Team Pager [phone] (M-F; 7a - 5p)   Please visit [email or web address]: For overnight coverage please call cardiology fellow first. If fellow not  available call Shock/ECMO MD on call.  For ECMO / Mechanical Support (Impella, IABP, LVAD) issues call Shock / ECMO MD on call.       [1]  Allergies Allergen Reactions   Ace Inhibitors Anaphylaxis and Swelling    Angioedema   Isosorbide  Anaphylaxis   Buspirone Other (See Comments)    dizziness   Doxycycline  Other (See Comments)    Real bad acid reflux/heartburn   Bidil [Isosorb Dinitrate-Hydralazine ] Other (See Comments)    headache   Digoxin      Unspecified side effects Thinks this had to do with his heart stopping    Digoxin  And Related     Unspecified side effects   Isosorbide  Dinitrate     Unsure of reaction   "

## 2024-12-17 ENCOUNTER — Inpatient Hospital Stay (HOSPITAL_COMMUNITY)

## 2024-12-17 ENCOUNTER — Encounter (HOSPITAL_COMMUNITY): Payer: Self-pay | Admitting: Cardiology

## 2024-12-17 DIAGNOSIS — I5021 Acute systolic (congestive) heart failure: Secondary | ICD-10-CM

## 2024-12-17 LAB — ECHOCARDIOGRAM COMPLETE
Area-P 1/2: 6.32 cm2
Est EF: <20
Height: 69 in
MV M vel: 4.44 m/s
MV Peak grad: 78.9 mmHg
S' Lateral: 7.9 cm
Single Plane A4C EF: 8.4 %
Weight: 5453.3 [oz_av]

## 2024-12-17 LAB — BASIC METABOLIC PANEL WITH GFR
Anion gap: 13 (ref 5–15)
BUN: 30 mg/dL — ABNORMAL HIGH (ref 6–20)
CO2: 27 mmol/L (ref 22–32)
Calcium: 9.6 mg/dL (ref 8.9–10.3)
Chloride: 97 mmol/L — ABNORMAL LOW (ref 98–111)
Creatinine, Ser: 1.8 mg/dL — ABNORMAL HIGH (ref 0.61–1.24)
GFR, Estimated: 44 mL/min — ABNORMAL LOW
Glucose, Bld: 132 mg/dL — ABNORMAL HIGH (ref 70–99)
Potassium: 4.2 mmol/L (ref 3.5–5.1)
Sodium: 136 mmol/L (ref 135–145)

## 2024-12-17 LAB — MAGNESIUM: Magnesium: 2.5 mg/dL — ABNORMAL HIGH (ref 1.7–2.4)

## 2024-12-17 MED ORDER — PERFLUTREN LIPID MICROSPHERE
1.0000 mL | INTRAVENOUS | Status: AC | PRN
  Administered 2024-12-17: 3 mL via INTRAVENOUS

## 2024-12-17 MED ORDER — ALPRAZOLAM 0.5 MG PO TABS
1.0000 mg | ORAL_TABLET | Freq: Once | ORAL | Status: AC | PRN
  Administered 2024-12-17: 1 mg via ORAL
  Filled 2024-12-17: qty 2

## 2024-12-17 MED ORDER — SODIUM CHLORIDE 0.9% FLUSH
10.0000 mL | Freq: Two times a day (BID) | INTRAVENOUS | Status: DC
  Administered 2024-12-17 – 2024-12-18 (×2): 10 mL
  Administered 2024-12-18: 30 mL
  Administered 2024-12-19 – 2024-12-21 (×3): 10 mL

## 2024-12-17 MED ORDER — CHLORHEXIDINE GLUCONATE CLOTH 2 % EX PADS
6.0000 | MEDICATED_PAD | Freq: Every day | CUTANEOUS | Status: DC
  Administered 2024-12-17 – 2024-12-21 (×5): 6 via TOPICAL

## 2024-12-17 MED ORDER — SODIUM CHLORIDE 0.9% FLUSH: 10.0000 mL | INTRAVENOUS | Status: DC | PRN

## 2024-12-17 NOTE — Progress Notes (Signed)
 OT Cancellation Note  Patient Details Name: [name] MRN: [id number] DOB: [birth year]   Cancelled Treatment:    Reason Eval/Treat Not Completed: Fatigue/lethargy limiting ability to participate (Pt with fatigue limiting ability to participate in OT eval at this time due to not sleeping well last night and PT eval earlier this day; suspect medications also impacting. OT to reattempt to see pt for OT eval at a later time as appropriate/available.)  Margarie Rockey HERO., OTR/L, MA Acute Rehab [phone]   Margarie FORBES Horns 12/17/2024, 3:30 PM

## 2024-12-17 NOTE — Progress Notes (Signed)
 "   Advanced Heart Failure Rounding Note   Subjective:     Remains on milrinone . PICC just placed. No co-ox yet.   + orthopnea and PND.   On lasix  gtt at 15/hr. 2L out.   Echo done today EF < 20% RV moderate to severely HK     Objective:   Weight Range:  Vital Signs:   Temp:  [97.4 F (36.3 C)-97.9 F (36.6 C)] 97.9 F (36.6 C) (01/17 1125) Pulse Rate:  [70-95] 70 (01/17 1125) Resp:  [17-27] 18 (01/17 1125) BP: (102-128)/(74-99) 107/86 (01/17 1125) SpO2:  [95 %-99 %] 95 % (01/17 1125) Weight:  [154.6 kg-155.6 kg] 154.6 kg (01/17 0300) Last BM Date : 12/15/24 (per pt)  Weight change: Filed Weights   12/16/24 0609 12/16/24 1547 12/17/24 0300  Weight: (!) 163.7 kg (!) 155.6 kg (!) 154.6 kg    Intake/Output:   Intake/Output Summary (Last 24 hours) at 12/17/2024 1421 Last data filed at 12/17/2024 1123 Gross per 24 hour  Intake 1809.79 ml  Output 7500 ml  Net -5690.21 ml     Physical Exam: General:  Sitting up in bed. No resp difficulty HEENT: normal Neck: supple. JVP to ear Cor: Regular rate & rhythm. +s3. Lungs: clear Abdomen: obese soft, nontender, nondistended.Good bowel sounds. Extremities: no cyanosis, clubbing, rash, 2-3+ edema Neuro: alert & orientedx3, cranial nerves grossly intact. moves all 4 extremities w/o difficulty. Affect pleasant   Telemetry: NSR 70-80s Personally reviewed  Labs: Basic Metabolic Panel: Recent Labs  Lab 12/14/24 1422 12/16/24 0827 12/16/24 1042 12/17/24 0251  NA 137 141 137 136  K 4.2 3.9 3.7 4.2  CL 103  --  102 97*  CO2 22  --  20* 27  GLUCOSE 118*  --  156* 132*  BUN 22*  --  25* 30*  CREATININE 1.40*  --  1.57* 1.80*  CALCIUM  9.0  --  9.1 9.6  MG  --   --  2.0 2.5*    Liver Function Tests: Recent Labs  Lab 12/14/24 1422 12/16/24 1042  AST 19 24  ALT 15 14  ALKPHOS 143* 148*  BILITOT 0.8 1.2  PROT 8.0 8.4*  ALBUMIN  3.7 3.6   No results for input(s): LIPASE, AMYLASE in the last 168 hours. No  results for input(s): AMMONIA in the last 168 hours.  CBC: Recent Labs  Lab 12/16/24 0636 12/16/24 0827  WBC 8.4  --   HGB 13.3 13.6  HCT 38.3* 40.0  MCV 76.1*  --   PLT 288  --     Cardiac Enzymes: No results for input(s): CKTOTAL, CKMB, CKMBINDEX, TROPONINI in the last 168 hours.  BNP: BNP (last 3 results) Recent Labs    06/30/24 1134 09/19/24 1325 11/07/24 1426  BNP 193.1* 342.3* 310.2*    ProBNP (last 3 results) Recent Labs    12/14/24 1422 12/16/24 1042  PROBNP 1,569.0* 1,982.0*      Other results:  Imaging: DG CHEST PORT 1 VIEW Result Date: 12/17/2024 CLINICAL DATA:  Status post PICC line placement. EXAM: PORTABLE CHEST 1 VIEW COMPARISON:  12/17/2024. FINDINGS: The heart is enlarged and the mediastinal contour stable. The pulmonary vasculature is distended. No consolidation, effusion, or pneumothorax is seen. A multi lead pacemaker device is present over the left chest. A right internal jugular central venous catheter terminates over the superior vena cava. IMPRESSION: 1. Cardiomegaly with pulmonary vascular congestion. 2. Right PICC line terminates over the superior vena cava. Electronically Signed   By: Leita Birmingham  M.D.   On: 12/17/2024 14:01   DG Chest Port 1 View Result Date: 12/17/2024 EXAM: 1 VIEW(S) XRAY OF THE CHEST 12/17/2024 06:30:00 AM COMPARISON: 11/07/2024 CLINICAL HISTORY: CHF (congestive heart failure) (HCC) FINDINGS: LINES, TUBES AND DEVICES: Left ICD in place. LUNGS AND PLEURA: Central mild pulmonary edema. Hazy opacity at left lung base, possibly representing atelectasis, consolidation or small pleural effusion. Small left pleural effusion. No pneumothorax. HEART AND MEDIASTINUM: Cardiomegaly, stable. BONES AND SOFT TISSUES: No acute osseous abnormality. IMPRESSION: 1. Mild pulmonary edema. 2. Small left pleural effusion with hazy left basilar opacity, which may represent atelectasis or consolidation. Electronically signed by: Waddell Calk MD 12/17/2024 07:56 AM EST RP Workstation: [id number]   CARDIAC CATHETERIZATION Result Date: 12/16/2024 1. Markedly elevated left and right heart filling pressures. 2. 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 3. Low cardiac output.   US  EKG SITE RITE Result Date: 12/16/2024 If Site Rite image not attached, placement could not be confirmed due to current cardiac rhythm.    Medications:     Scheduled Medications:  amiodarone   100 mg Oral Daily   atorvastatin   40 mg Oral Daily   dapagliflozin  propanediol  10 mg Oral Daily   pantoprazole   40 mg Oral Daily   rivaroxaban   20 mg Oral Q supper   sodium chloride  flush  3 mL Intravenous Q12H   spironolactone   25 mg Oral Daily   traZODone   50 mg Oral QHS    Infusions:  furosemide  (LASIX ) 200 mg in dextrose  5 % 100 mL (2 mg/mL) infusion 15 mg/hr (12/17/24 1209)   milrinone  0.25 mcg/kg/min (12/17/24 0924)    PRN Medications: acetaminophen , cyclobenzaprine , melatonin, ondansetron  (ZOFRAN ) IV, sodium chloride  flush   Assessment/Plan:    1.  Acute on chronic systolic CHF with low output: Nonischemic cardiomyopathy. ?If ETOH has played a role (now drinks rarely). Echo (3/18) with EF 25-30%. Echo 7/19 and in 2/21 with EF 20-25%. Echo 6/22 with EF < 20%, normal RV.  Has St Jude CRT-D device now.  Echo-post CRT (8/23) showed EF < 20%, mild MR, RV normal.  CPX in 9/23 with moderate functional limitation due to HF and body habitus. Echo in 9/24 showed  EF <20% with severe LV dilation, normal RV size/function, no significant MR.  CPX in 10/24 showed mild to moderate HF limitation but RHC in 10/24 showed low output (1.69 thermo/2.05 Fick).  Medication titration has been limited by hypotension.  He was worked up for LVAD after RHC in 10/24 (not transplant candidate with weight and smoking), but he decided that he did not want to pursue as his symptoms were minimal.  Today, he is markedly  volume overloaded with NYHA class IIIb symptoms.  I think that his panic attacks are PND.   - RHC 1/16 RA 18 PA 83/40 (54) PCWP 41 Fick 4.0/1.5 TD 4.2/1.6 PVR 3.09 WU PAPi 2.4  - Echo done today EF < 20% RV moderate to severely HK  - Continue milrinone  0.25 mcg/kg/min - PICC just placed. Follow CVP and co-ox.  - Markedly volume overloaded. Increase lasix  gtt to 20/hr - Place UNNA - Unable to tolerate losartan  due to low BP (off).   - Stop Coreg  with volume overload and low output.  - Continue spiro 25 mg daily - Continue farxiga  10 mg daily - No Entresto, ACEI with h/o angioedema.  - Headaches with Bidil, cannot take.  - He did not tolerate digoxin .   - Insurance denied baroreceptor activation therapy.  -  Will need full diuresis then again will need to consider LVAD.  His weight and social situation will make him a difficult candidate, but I think he has end stage heart failure at this point.  VAD coordinators have seen  2. Atrial fibrillation: Paroyxsmal. Emergent DCCV in 6/22. Rhythm regular on exam. < 1% burden on device check - In NSR - Continue amiodarone  100 mg daily. Seeing endocrinology for hyperthyroidism, now off methimazole .  - Continue Xarelto  for now.  3. HTN: BP now low, no room to titrate GDMT as above. 4. 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  Has St Jude CRT-D device now. On amiodarone . No VT on device interrogation today. - Continue amiodarone  100 mg daily.  5. OSA: reports full compliance w/ BiPAP.  - Continue nightly. 6. PE: 04/14/2018, diagnosed by V/Q scan. Has been on Xarelto . No bleeding issues. 7. Anxiety/Panic attacks/depression: Followed by psychiatry.  Symptoms worse since he stopped meds last summer but suspect panic attacks are more likely PND from volume overload.   8. Hyperlipidemia:   - Continue atorvastatin . 9. Obesity:   - Body mass index is 50.33 kg/m.  - He is on Mounjaro . 10. Tobacco use: Smoking about 5 cigarettes daily. Quit in the  past. - Discussed need to quit  11. T2DM  - on Mounjaro  and Farxiga  12. Hyperthyroidism: Suspect related to amiodarone .  Followed by endocrinology but now off methimazole .  TSH mildly elevated with normal free T3 and free T4 on recent check.   13. Aki on CKD stage 3: Creatinine 1.5 -> 1.8 today  - suspect cardiorenal. - follow closely      Length of Stay: 1   Toribio Fuel MD 12/17/2024, 2:21 PM  Advanced Heart Failure Team Pager [phone] (M-F; 7a - 4p)  Please contact [facility] for night-coverage after hours (4p -7a ) and weekends on [email or web address]  "

## 2024-12-17 NOTE — Evaluation (Signed)
 Physical Therapy Evaluation Patient Details Name: [name] MRN: [id number] DOB: Apr 13, 1966 Today's Date: 12/17/2024  History of Present Illness  Pt is a 59 y.o. M presenting to [facility] on 12/16/24 for RHC surgical intervention. PMH is significant for CHF, A-fib, CKD, hyperthyroidism, T2DM, obesity, anxiety, HLD, gout.  Clinical Impression  Prior to admittance, pt was mobilizing independently and occasionally mod I using SPC when feeling fatigued or unsteady; indep with ADLs. Pt presents to evaluation with deficits in mobility, power, activity tolerance, and balance. Pt was able to ambulate short household distances with and without AD and no physical assistance. Unable to progress due to significant fatigue following events of the day with pt requesting to return to the room. PT will continue to treat pt while he is admitted. Pending progress, recommending HHPT at discharge to address remaining mobility deficits and optimize return to PLOF.         If plan is discharge home, recommend the following: A little help with walking and/or transfers;A little help with bathing/dressing/bathroom;Help with stairs or ramp for entrance;Assist for transportation   Can travel by private vehicle        Equipment Recommendations None recommended by PT  Recommendations for Other Services       Functional Status Assessment Patient has had a recent decline in their functional status and demonstrates the ability to make significant improvements in function in a reasonable and predictable amount of time.     Precautions / Restrictions Precautions Precautions: Fall Recall of Precautions/Restrictions: Intact Restrictions Weight Bearing Restrictions Per Provider Order: No      Mobility  Bed Mobility Overal bed mobility: Modified Independent             General bed mobility comments: Sup <> sit on L side of bed with HOB elevated and no physical assitance. Increased time to complete.     Transfers Overall transfer level: Needs assistance Equipment used: None Transfers: Sit to/from Stand Sit to Stand: Supervision           General transfer comment: STS from EOB without using BUE, rocking to use forward momentum to assist. Increased time to complete.    Ambulation/Gait Ambulation/Gait assistance: Contact guard assist Gait Distance (Feet): 50 Feet Assistive device: IV Pole Gait Pattern/deviations: Step-through pattern, Decreased stride length, Trunk flexed Gait velocity: reduced Gait velocity interpretation: <1.8 ft/sec, indicate of risk for recurrent falls   General Gait Details: Pt demonstrates reciprocal gait pattern with reduced gait speed, stabilizing self via IV pole. At end of gait, pt able to ambualte without holding onto IV pole; no losses of balance noted.  Stairs            Wheelchair Mobility     Tilt Bed    Modified Rankin (Stroke Patients Only)       Balance Overall balance assessment: Needs assistance Sitting-balance support: No upper extremity supported, Feet supported Sitting balance-Leahy Scale: Good Sitting balance - Comments: seated EOB   Standing balance support: During functional activity, No upper extremity supported Standing balance-Leahy Scale: Fair Standing balance comment: pt able to stand without UE support and ambulated 10' without device and no losses of balance or signs of instability noted                             Pertinent Vitals/Pain Pain Assessment Pain Assessment: No/denies pain    Home Living Family/patient expects to be discharged to:: Private residence Living Arrangements: Children;Other relatives (  son (16)) Available Help at Discharge: Family;Available PRN/intermittently Type of Home: House Home Access: Level entry     Alternate Level Stairs-Number of Steps: 15 Home Layout: Two level Home Equipment: Tub bench;Grab bars - tub/shower;Cane - single point      Prior Function Prior  Level of Function : Independent/Modified Independent             Mobility Comments: indep - mod I, occasionally using SPC for mobility when feeling weak ADLs Comments: indep     Extremity/Trunk Assessment   Upper Extremity Assessment Upper Extremity Assessment: Defer to OT evaluation    Lower Extremity Assessment Lower Extremity Assessment: Generalized weakness    Cervical / Trunk Assessment Cervical / Trunk Assessment: Kyphotic  Communication   Communication Communication: No apparent difficulties    Cognition Arousal: Lethargic (pt recenly received xanax  and is having difficulty maintaining awake during session, but with warm wash cloth and occasional tactile cues, pt's alertness increased) Behavior During Therapy: WFL for tasks assessed/performed   PT - Cognitive impairments: No apparent impairments                         Following commands: Intact       Cueing Cueing Techniques: Verbal cues, Gestural cues     General Comments General comments (skin integrity, edema, etc.): 2L via [place] SpO2 94% (typically only using oxygen  for sleeping)    Exercises     Assessment/Plan    PT Assessment Patient needs continued PT services  PT Problem List Decreased activity tolerance;Decreased balance;Decreased mobility       PT Treatment Interventions DME instruction;Gait training;Stair training;Functional mobility training;Therapeutic activities;Therapeutic exercise;Balance training;Wheelchair mobility training;Manual techniques;Modalities    PT Goals (Current goals can be found in the Care Plan section)  Acute Rehab PT Goals Patient Stated Goal: to be able to sleep better PT Goal Formulation: With patient Time For Goal Achievement: 12/31/24 Potential to Achieve Goals: Good    Frequency Min 1X/week     Co-evaluation               AM-PAC PT 6 Clicks Mobility  Outcome Measure Help needed turning from your back to your side while in a flat bed  without using bedrails?: None Help needed moving from lying on your back to sitting on the side of a flat bed without using bedrails?: None Help needed moving to and from a bed to a chair (including a wheelchair)?: A Little Help needed standing up from a chair using your arms (e.g., wheelchair or bedside chair)?: A Little Help needed to walk in hospital room?: A Little Help needed climbing 3-5 steps with a railing? : Total 6 Click Score: 18    End of Session Equipment Utilized During Treatment: Gait belt;Oxygen  Activity Tolerance: Patient limited by lethargy Patient left: in bed;with call bell/phone within reach;with bed alarm set Nurse Communication: Mobility status PT Visit Diagnosis: Other abnormalities of gait and mobility (R26.89)    Time: 8553-8484 PT Time Calculation (min) (ACUTE ONLY): 29 min   Charges:   PT Evaluation $PT Eval Moderate Complexity: 1 Mod   PT General Charges $$ ACUTE PT VISIT: 1 Visit         [name] DPT Acute Rehab Services [phone] Prefer contact via chat   [name] [name] 12/17/2024, 4:51 PM

## 2024-12-17 NOTE — Plan of Care (Signed)

## 2024-12-17 NOTE — Progress Notes (Signed)
 Orthopedic Tech Progress Note Patient Details:  [name] 04-09-1966 [id number] Applied unna boots with assistance from RN per order.  Ortho Devices Type of Ortho Device: Ace wrap, Unna boot Ortho Device/Splint Location: BLE Ortho Device/Splint Interventions: Ordered, Adjustment, Application   Post Interventions Patient Tolerated: Well Instructions Provided: Adjustment of device, Care of device, Poper ambulation with device  Morna Pink 12/17/2024, 5:00 PM

## 2024-12-17 NOTE — Progress Notes (Signed)
 PT Cancellation Note  Patient Details Name: [name] MRN: [id number] DOB: 12/20/1965   Cancelled Treatment:    Reason Eval/Treat Not Completed: Patient at procedure or test/unavailable (Pt off unit for procedure. Plan to attempt to follow up later.)  Leontine Hilt DPT Acute Rehab Services [phone] Prefer contact via chat   Leontine NOVAK Kyerra Vargo 12/17/2024, 1:31 PM

## 2024-12-17 NOTE — Progress Notes (Signed)
 Peripherally Inserted Central Catheter Placement  The IV Nurse has discussed with the patient and/or persons authorized to consent for the patient, the purpose of this procedure and the potential benefits and risks involved with this procedure.  The benefits include less needle sticks, lab draws from the catheter, and the patient may be discharged home with the catheter. Risks include, but not limited to, infection, bleeding, blood clot (thrombus formation), and puncture of an artery; nerve damage and irregular heartbeat and possibility to perform a PICC exchange if needed/ordered by physician.  Alternatives to this procedure were also discussed.  Bard Power PICC patient education guide, fact sheet on infection prevention and patient information card has been provided to patient /or left at bedside.    PICC Placement Documentation  PICC Double Lumen 12/17/24 Right Brachial 44 cm 0 cm (Active)  Indication for Insertion or Continuance of Line Chronic illness with exacerbations (CF, Sickle Cell, etc.) 12/17/24 1252  Exposed Catheter (cm) 0 cm 12/17/24 1252  Site Assessment Clean, Dry, Intact 12/17/24 1252  Lumen #1 Status Flushed;Saline locked;Blood return noted 12/17/24 1252  Lumen #2 Status Flushed;Saline locked;Blood return noted 12/17/24 1252  Dressing Type Transparent;Securing device 12/17/24 1252  Dressing Status Antimicrobial disc/dressing in place;Clean, Dry, Intact 12/17/24 1252  Line Care Connections checked and tightened 12/17/24 1252  Line Adjustment (NICU/IV Team Only) No 12/17/24 1252  Dressing Intervention New dressing;Adhesive placed at insertion site (IV team only) 12/17/24 1252  Dressing Change Due 12/24/24 12/17/24 1252       Maegan Buller Sheral Ruth 12/17/2024, 12:58 PM

## 2024-12-17 NOTE — Progress Notes (Signed)
 IV team consulted for placement of new PIV due complaints of pain with infusion. Existing PIV are patent. Unit RN lowered rate on infusion of Mg and pt now has no complaints of pain with infusion. Advised pt that if pain returns to notify RN to reconsult IV team for placement of new PIV. Unit RN made aware of this plan.

## 2024-12-17 NOTE — Progress Notes (Signed)
" °  Echocardiogram 2D Echocardiogram has been performed.  Koleen KANDICE Popper, RDCS 12/17/2024, 2:32 PM "

## 2024-12-18 DIAGNOSIS — I5020 Unspecified systolic (congestive) heart failure: Secondary | ICD-10-CM

## 2024-12-18 LAB — BASIC METABOLIC PANEL WITH GFR
Anion gap: 14 (ref 5–15)
BUN: 33 mg/dL — ABNORMAL HIGH (ref 6–20)
CO2: 28 mmol/L (ref 22–32)
Calcium: 9.4 mg/dL (ref 8.9–10.3)
Chloride: 92 mmol/L — ABNORMAL LOW (ref 98–111)
Creatinine, Ser: 1.91 mg/dL — ABNORMAL HIGH (ref 0.61–1.24)
GFR, Estimated: 41 mL/min — ABNORMAL LOW
Glucose, Bld: 197 mg/dL — ABNORMAL HIGH (ref 70–99)
Potassium: 3.4 mmol/L — ABNORMAL LOW (ref 3.5–5.1)
Sodium: 134 mmol/L — ABNORMAL LOW (ref 135–145)

## 2024-12-18 LAB — COOXEMETRY PANEL
Carboxyhemoglobin: 1.6 % — ABNORMAL HIGH (ref 0.5–1.5)
Methemoglobin: <0.7 % (ref 0.0–1.5)
O2 Saturation: 62.4 %
Total hemoglobin: 15.2 g/dL (ref 12.0–16.0)

## 2024-12-18 LAB — MAGNESIUM: Magnesium: 2.3 mg/dL (ref 1.7–2.4)

## 2024-12-18 MED ORDER — POTASSIUM CHLORIDE 20 MEQ PO PACK
40.0000 meq | PACK | ORAL | Status: AC
  Administered 2024-12-18 (×2): 40 meq via ORAL
  Filled 2024-12-18 (×2): qty 2

## 2024-12-18 NOTE — Progress Notes (Signed)
 Received call from CCMD notified that pt got 4 beats of vtach. Pt asymptomatic. Will continue to monitor pt.   Amado GORMAN Arabia, RN

## 2024-12-18 NOTE — Plan of Care (Signed)
  Problem: Activity: Goal: Ability to return to baseline activity level will improve Outcome: Progressing   Problem: Education: Goal: Knowledge of General Education information will improve Description: Including pain rating scale, medication(s)/side effects and non-pharmacologic comfort measures Outcome: Progressing   Problem: Health Behavior/Discharge Planning: Goal: Ability to manage health-related needs will improve Outcome: Progressing   Problem: Clinical Measurements: Goal: Will remain free from infection Outcome: Progressing

## 2024-12-18 NOTE — Progress Notes (Signed)
 Mobility Specialist Progress Note:    12/18/24 1547  Mobility  Activity Ambulated with assistance  Level of Assistance Contact guard assist, steadying assist  Assistive Device Other (Comment) (HHA)  Distance Ambulated (ft) 15 ft  Range of Motion/Exercises Active  Activity Response Tolerated fair  Mobility Referral Yes  Mobility visit 1 Mobility  Mobility Specialist Start Time (ACUTE ONLY) 1547  Mobility Specialist Stop Time (ACUTE ONLY) 1556  Mobility Specialist Time Calculation (min) (ACUTE ONLY) 9 min   Received pt in room agreeable to session. C/o of some in hand and feeling SOB but otherwise doing well. Pt moving and ambulating decently. Returned pt to bed w/ all needs met.   Venetia Keel Mobility Specialist Please Neurosurgeon or Rehab Office at [phone]

## 2024-12-18 NOTE — Progress Notes (Signed)
" ° ° °  Please excuse the patient's wife from work.  She is visiting her husband in the hospital.  She will need time off during his stay to attend to him.   "

## 2024-12-18 NOTE — Evaluation (Signed)
 Occupational Therapy Evaluation Patient Details Name: [name] MRN: [id number] DOB: 1966/02/22 Today's Date: 12/18/2024   History of Present Illness   Pt is a 59 y.o. M presenting to [facility] on 12/16/24 for RHC surgical intervention. PMH is significant for CHF, A-fib, CKD, hyperthyroidism, T2DM, obesity, anxiety, HLD, gout.     Clinical Impressions Jaydis was evaluated s/p the above admission list. He is indep at baseline. Upon evaluation the pt was limited by activity tolerance, SOB with activity, chronic bilat hand arthritic pain and generalized weakness. Overall he needed generalized superivsion A for ambulation and ADLs, no DME needed. Pt was on RA throughout with SpO2 >90%. Pt will benefit from continued acute OT services and assist from family as needed at discharge.      If plan is discharge home, recommend the following:   A little help with walking and/or transfers;Assistance with cooking/housework;Assist for transportation;Help with stairs or ramp for entrance     Functional Status Assessment   Patient has had a recent decline in their functional status and demonstrates the ability to make significant improvements in function in a reasonable and predictable amount of time.     Equipment Recommendations   None recommended by OT      Precautions/Restrictions   Precautions Precautions: Fall Recall of Precautions/Restrictions: Intact Precaution/Restrictions Comments: PICC, 2L when sleeping Restrictions Weight Bearing Restrictions Per Provider Order: No     Mobility Bed Mobility               General bed mobility comments: OOb o narrival    Transfers Overall transfer level: Needs assistance Equipment used: None Transfers: Sit to/from Stand Sit to Stand: Supervision                  Balance Overall balance assessment: Needs assistance Sitting-balance support: No upper extremity supported, Feet supported Sitting balance-Leahy Scale:  Good     Standing balance support: During functional activity, No upper extremity supported Standing balance-Leahy Scale: Fair Standing balance comment: ambulated and completed OOB ADLs without DME                           ADL either performed or assessed with clinical judgement   ADL Overall ADL's : Needs assistance/impaired       General ADL Comments: generalized supervision A for safety only, no AD needed     Vision Baseline Vision/History: 0 No visual deficits Vision Assessment?: No apparent visual deficits Additional Comments: chronic dysconjugagte gaze     Perception Perception: Within Functional Limits       Praxis Praxis: WFL       Pertinent Vitals/Pain Pain Assessment Pain Assessment: No/denies pain     Extremity/Trunk Assessment Upper Extremity Assessment Upper Extremity Assessment: RUE deficits/detail;LUE deficits/detail RUE Deficits / Details: arthritic pain in hands and wrists, decreased grip strength. using functionally for ADLs LUE Deficits / Details: arthritic pain in hands and wrists, decreased grip strength. using functionally for ADLs   Lower Extremity Assessment Lower Extremity Assessment: Defer to PT evaluation   Cervical / Trunk Assessment Cervical / Trunk Assessment: Kyphotic;Other exceptions Cervical / Trunk Exceptions: body habitus   Communication Communication Communication: No apparent difficulties   Cognition Arousal: Alert Behavior During Therapy: WFL for tasks assessed/performed Cognition: No apparent impairments                               Following commands: Intact  Cueing  General Comments   Cueing Techniques: Verbal cues;Gestural cues  2L donned at the start of the session, session completed on RA with SPO2 >90%.           Home Living Family/patient expects to be discharged to:: Private residence Living Arrangements: Children;Other relatives Available Help at Discharge:  Family;Available PRN/intermittently Type of Home: House Home Access: Level entry     Home Layout: Two level Alternate Level Stairs-Number of Steps: 15 Alternate Level Stairs-Rails: Right Bathroom Shower/Tub: Chief Strategy Officer: Standard     Home Equipment: Tub bench;Grab bars - tub/shower;Cane - single point          Prior Functioning/Environment Prior Level of Function : Independent/Modified Independent;Driving             Mobility Comments: indep - mod I, occasionally using SPC for mobility when feeling weak ADLs Comments: indep    OT Problem List: Decreased range of motion;Decreased activity tolerance;Impaired balance (sitting and/or standing);Cardiopulmonary status limiting activity   OT Treatment/Interventions: Self-care/ADL training;Therapeutic exercise;Energy conservation;DME and/or AE instruction;Therapeutic activities;Balance training;Patient/family education      OT Goals(Current goals can be found in the care plan section)   Acute Rehab OT Goals Patient Stated Goal: home OT Goal Formulation: With patient Time For Goal Achievement: 01/01/25 Potential to Achieve Goals: Good ADL Goals Additional ADL Goal #1: Pt will complete all ADLs at indep baseline Additional ADL Goal #2: Pt wil indep recall at least 3 energy conservation strategies   OT Frequency:  Min 1X/week       AM-PAC OT 6 Clicks Daily Activity     Outcome Measure Help from another person eating meals?: None Help from another person taking care of personal grooming?: A Little Help from another person toileting, which includes using toliet, bedpan, or urinal?: A Little Help from another person bathing (including washing, rinsing, drying)?: A Little Help from another person to put on and taking off regular upper body clothing?: A Little Help from another person to put on and taking off regular lower body clothing?: A Little 6 Click Score: 19   End of Session Equipment  Utilized During Treatment: Gait belt Nurse Communication: Mobility status  Activity Tolerance: Patient tolerated treatment well Patient left: in chair;with call bell/phone within reach  OT Visit Diagnosis: Unsteadiness on feet (R26.81);Other abnormalities of gait and mobility (R26.89);Muscle weakness (generalized) (M62.81)                Time: 8584-8565 OT Time Calculation (min): 19 min Charges:  OT General Charges $OT Visit: 1 Visit OT Evaluation $OT Eval Low Complexity: 1 Low  Lucie Kendall, OTR/L Acute Rehabilitation Services Office [phone] Secure Chat Communication Preferred   Lucie JONETTA Kendall 12/18/2024, 3:01 PM

## 2024-12-18 NOTE — Progress Notes (Signed)
 "   Advanced Heart Failure Rounding Note   Subjective:     Remains on milrinone  0.25 Co-ox 62%   On lasix  gtt at 20/hr. Almost 5L out  CVP 5  Feeling better. Ambulating halls  Echo 1/17 EF < 20% RV moderate to severely HK     Objective:   Weight Range:  Vital Signs:   Temp:  [97.4 F (36.3 C)-98 F (36.7 C)] 97.4 F (36.3 C) (01/18 0748) Pulse Rate:  [70-96] 96 (01/18 0748) Resp:  [17-20] 19 (01/18 0748) BP: (105-119)/(71-94) 110/71 (01/18 0748) SpO2:  [94 %-97 %] 97 % (01/18 0748) FiO2 (%):  [28 %] 28 % (01/17 2100) Weight:  [145.9 kg] 145.9 kg (01/18 0338) Last BM Date : 12/15/24  Weight change: Filed Weights   12/16/24 1547 12/17/24 0300 12/18/24 0338  Weight: (!) 155.6 kg (!) 154.6 kg (!) 145.9 kg    Intake/Output:   Intake/Output Summary (Last 24 hours) at 12/18/2024 1035 Last data filed at 12/18/2024 0919 Gross per 24 hour  Intake 1285.84 ml  Output 3775 ml  Net -2489.16 ml     Physical Exam: General:  Ambulating halls No resp difficulty HEENT: normal Neck: supple. no JVD.  Cor: Regular rate & rhythm. No rubs, gallops or murmurs. Lungs: clear Abdomen: soft, nontender, nondistended.Good bowel sounds. Extremities: no cyanosis, clubbing, rash, edema +UNNA Neuro: alert & orientedx3, cranial nerves grossly intact. moves all 4 extremities w/o difficulty. Affect pleasant   Telemetry: NSR 80-90s Personally reviewed  Labs: Basic Metabolic Panel: Recent Labs  Lab 12/14/24 1422 12/16/24 0827 12/16/24 1042 12/17/24 0251 12/18/24 0400  NA 137 141 137 136 134*  K 4.2 3.9 3.7 4.2 3.4*  CL 103  --  102 97* 92*  CO2 22  --  20* 27 28  GLUCOSE 118*  --  156* 132* 197*  BUN 22*  --  25* 30* 33*  CREATININE 1.40*  --  1.57* 1.80* 1.91*  CALCIUM  9.0  --  9.1 9.6 9.4  MG  --   --  2.0 2.5* 2.3    Liver Function Tests: Recent Labs  Lab 12/14/24 1422 12/16/24 1042  AST 19 24  ALT 15 14  ALKPHOS 143* 148*  BILITOT 0.8 1.2  PROT 8.0 8.4*  ALBUMIN   3.7 3.6   No results for input(s): LIPASE, AMYLASE in the last 168 hours. No results for input(s): AMMONIA in the last 168 hours.  CBC: Recent Labs  Lab 12/16/24 0636 12/16/24 0827  WBC 8.4  --   HGB 13.3 13.6  HCT 38.3* 40.0  MCV 76.1*  --   PLT 288  --     Cardiac Enzymes: No results for input(s): CKTOTAL, CKMB, CKMBINDEX, TROPONINI in the last 168 hours.  BNP: BNP (last 3 results) Recent Labs    06/30/24 1134 09/19/24 1325 11/07/24 1426  BNP 193.1* 342.3* 310.2*    ProBNP (last 3 results) Recent Labs    12/14/24 1422 12/16/24 1042  PROBNP 1,569.0* 1,982.0*      Other results:  Imaging: ECHOCARDIOGRAM COMPLETE Result Date: 12/17/2024    ECHOCARDIOGRAM REPORT   Patient Name:   [name] Date of Exam: 12/17/2024 Medical Rec #:  [id number]      Height:       69.0 in Accession #:    [id number]     Weight:       340.8 lb Date of Birth:  [birth year]     BSA:  2.591 m Patient Age:    59 years       BP:           116/84 mmHg Patient Gender: M              HR:           82 bpm. Exam Location:  Inpatient Procedure: 2D Echo, Cardiac Doppler, Color Doppler and Intracardiac            Opacification Agent (Both Spectral and Color Flow Doppler were            utilized during procedure). Indications:     CHF- Acute Systolic I50.21  History:         Patient has prior history of Echocardiogram examinations, most                  recent 08/28/2023. CHF, Pacemaker and Defibrillator; Risk                  Factors:Hypertension, Sleep Apnea, Diabetes, CKD and                  Dyslipidemia.  Sonographer:     Koleen Popper RDCS Referring Phys:  EZRA GORMAN SHUCK Diagnosing Phys: Diannah Priya Mallipeddi  Sonographer Comments: Patient is obese. Image acquisition challenging due to patient body habitus. IMPRESSIONS  1. No LV thrombus by Definity . Left ventricular ejection fraction, by estimation, is <20%. Left ventricular ejection fraction by PLAX is 5 %. The left ventricle  has severely decreased function. The left ventricle has no regional wall motion abnormalities. The left ventricular internal cavity size was severely dilated. There is moderate left ventricular hypertrophy of the lateral segment. Left ventricular diastolic parameters are consistent with Grade III diastolic dysfunction (restrictive).  2. Right ventricular systolic function is moderately reduced. The right ventricular size is normal. Tricuspid regurgitation signal is inadequate for assessing PA pressure.  3. Left atrial size was moderately dilated.  4. The mitral valve is normal in structure. No evidence of mitral valve regurgitation. No evidence of mitral stenosis.  5. The aortic valve was not well visualized. Aortic valve regurgitation is mild. No aortic stenosis is present.  6. The inferior vena cava is normal in size with greater than 50% respiratory variability, suggesting right atrial pressure of 3 mmHg. Comparison(s): No significant change from prior study. FINDINGS  Left Ventricle: No LV thrombus by Definity . Left ventricular ejection fraction, by estimation, is <20%. Left ventricular ejection fraction by PLAX is 5 %. The left ventricle has severely decreased function. The left ventricle has no regional wall motion  abnormalities. Definity  contrast agent was given IV to delineate the left ventricular endocardial borders. Strain was performed and the global longitudinal strain is indeterminate. The left ventricular internal cavity size was severely dilated. There is  moderate left ventricular hypertrophy of the lateral segment. Left ventricular diastolic parameters are consistent with Grade III diastolic dysfunction (restrictive). Right Ventricle: The right ventricular size is normal. No increase in right ventricular wall thickness. Right ventricular systolic function is moderately reduced. Tricuspid regurgitation signal is inadequate for assessing PA pressure. Left Atrium: Left atrial size was moderately  dilated. Right Atrium: Right atrial size was normal in size. Pericardium: There is no evidence of pericardial effusion. Mitral Valve: The mitral valve is normal in structure. No evidence of mitral valve regurgitation. No evidence of mitral valve stenosis. Tricuspid Valve: The tricuspid valve is normal in structure. Tricuspid valve regurgitation is not demonstrated. No evidence of tricuspid stenosis.  Aortic Valve: The aortic valve was not well visualized. Aortic valve regurgitation is mild. No aortic stenosis is present. Pulmonic Valve: The pulmonic valve was grossly normal. Pulmonic valve regurgitation is trivial. No evidence of pulmonic stenosis. Aorta: The aortic root and ascending aorta are structurally normal, with no evidence of dilitation. Venous: The inferior vena cava is normal in size with greater than 50% respiratory variability, suggesting right atrial pressure of 3 mmHg. IAS/Shunts: No atrial level shunt detected by color flow Doppler. Additional Comments: 3D was performed not requiring image post processing on an independent workstation and was indeterminate. A device lead is visualized.  LEFT VENTRICLE PLAX 2D LV EF:         Left            Diastology                ventricular     LV e' medial:    5.87 cm/s                ejection        LV E/e' medial:  13.2                fraction by     LV e' lateral:   10.40 cm/s                PLAX is 5 %.    LV E/e' lateral: 7.4 LVIDd:         8.10 cm LVIDs:         7.90 cm LV PW:         1.50 cm LV IVS:        1.20 cm LVOT diam:     2.10 cm LV SV:         38 LV SV Index:   15 LVOT Area:     3.46 cm  LV Volumes (MOD) LV vol d, MOD    501.0 ml A4C: LV vol s, MOD    459.0 ml A4C: LV SV MOD A4C:   501.0 ml IVC IVC diam: 1.90 cm LEFT ATRIUM              Index LA diam:        5.30 cm  2.05 cm/m LA Vol (A2C):   96.6 ml  37.28 ml/m LA Vol (A4C):   105.0 ml 40.52 ml/m LA Biplane Vol: 105.0 ml 40.52 ml/m  AORTIC VALVE LVOT Vmax:   76.60 cm/s LVOT Vmean:  51.100 cm/s  LVOT VTI:    0.111 m  AORTA Ao Root diam: 3.60 cm Ao Asc diam:  3.90 cm MITRAL VALVE MV Area (PHT): 6.32 cm    SHUNTS MV Decel Time: 120 msec    Systemic VTI:  0.11 m MR Peak grad: 78.9 mmHg    Systemic Diam: 2.10 cm MR Vmax:      444.00 cm/s MV E velocity: 77.30 cm/s MV A velocity: 37.40 cm/s MV E/A ratio:  2.07 Vishnu Priya Mallipeddi Electronically signed by Diannah Late Mallipeddi Signature Date/Time: 12/17/2024/2:38:14 PM    Final (Updated)    DG CHEST PORT 1 VIEW Result Date: 12/17/2024 CLINICAL DATA:  Status post PICC line placement. EXAM: PORTABLE CHEST 1 VIEW COMPARISON:  12/17/2024. FINDINGS: The heart is enlarged and the mediastinal contour stable. The pulmonary vasculature is distended. No consolidation, effusion, or pneumothorax is seen. A multi lead pacemaker device is present over the left chest. A right internal jugular central venous catheter terminates over the superior vena  cava. IMPRESSION: 1. Cardiomegaly with pulmonary vascular congestion. 2. Right PICC line terminates over the superior vena cava. Electronically Signed   By: Leita Birmingham M.D.   On: 12/17/2024 14:01   DG Chest Port 1 View Result Date: 12/17/2024 EXAM: 1 VIEW(S) XRAY OF THE CHEST 12/17/2024 06:30:00 AM COMPARISON: 11/07/2024 CLINICAL HISTORY: CHF (congestive heart failure) (HCC) FINDINGS: LINES, TUBES AND DEVICES: Left ICD in place. LUNGS AND PLEURA: Central mild pulmonary edema. Hazy opacity at left lung base, possibly representing atelectasis, consolidation or small pleural effusion. Small left pleural effusion. No pneumothorax. HEART AND MEDIASTINUM: Cardiomegaly, stable. BONES AND SOFT TISSUES: No acute osseous abnormality. IMPRESSION: 1. Mild pulmonary edema. 2. Small left pleural effusion with hazy left basilar opacity, which may represent atelectasis or consolidation. Electronically signed by: Taylor Stroud MD 12/17/2024 07:56 AM EST RP Workstation: [id number]     Medications:     Scheduled Medications:   amiodarone   100 mg Oral Daily   atorvastatin   40 mg Oral Daily   Chlorhexidine  Gluconate Cloth  6 each Topical Daily   dapagliflozin  propanediol  10 mg Oral Daily   pantoprazole   40 mg Oral Daily   rivaroxaban   20 mg Oral Q supper   sodium chloride  flush  10-40 mL Intracatheter Q12H   sodium chloride  flush  3 mL Intravenous Q12H   spironolactone   25 mg Oral Daily   traZODone   50 mg Oral QHS    Infusions:  furosemide  (LASIX ) 200 mg in dextrose  5 % 100 mL (2 mg/mL) infusion 20 mg/hr (12/18/24 0729)   milrinone  0.25 mcg/kg/min (12/18/24 0900)    PRN Medications: acetaminophen , cyclobenzaprine , melatonin, ondansetron  (ZOFRAN ) IV, sodium chloride  flush, sodium chloride  flush   Assessment/Plan:    1.  Acute on chronic systolic CHF with low output: Nonischemic cardiomyopathy. ?If ETOH has played a role (now drinks rarely). Echo (3/18) with EF 25-30%. Echo 7/19 and in 2/21 with EF 20-25%. Echo 6/22 with EF < 20%, normal RV.  Has St Jude CRT-D device now.  Echo-post CRT (8/23) showed EF < 20%, mild MR, RV normal.  CPX in 9/23 with moderate functional limitation due to HF and body habitus. Echo in 9/24 showed  EF <20% with severe LV dilation, normal RV size/function, no significant MR.  CPX in 10/24 showed mild to moderate HF limitation but RHC in 10/24 showed low output (1.69 thermo/2.05 Fick).  Medication titration has been limited by hypotension.  He was worked up for LVAD after RHC in 10/24 (not transplant candidate with weight and smoking), but he decided that he did not want to pursue as his symptoms were minimal.  Today, he is markedly volume overloaded with NYHA class IIIb symptoms.  I think that his panic attacks are PND.   - RHC 1/16 RA 18 PA 83/40 (54) PCWP 41 Fick 4.0/1.5 TD 4.2/1.6 PVR 3.09 WU PAPi 2.4  - Echo 12/17/24: EF < 20% RV moderate to severely HK  - Remains on milrinone  0.25 Co-ox 62%  - On lasix  gtt at 20/hr. Almost 5L out  CVP 5. SCr up. Stop IV lasix  - Continue milrinone   0.25 mcg/kg/min - Continue UNNA - Unable to tolerate losartan  due to low BP (off).   - Off Coreg  with volume overload and low output.  - Continue spiro 25 mg daily - Continue farxiga  10 mg daily - No Entresto, ACEI with h/o angioedema.  - Headaches with Bidil, cannot take.  - He did not tolerate digoxin .   - Insurance denied baroreceptor activation therapy.  -  He remains resistant to LVAD (and with RV dysfunction may not be good candidate) His weight and social situation will make him a difficult candidate as well. VAD coordinators have seen  2. Atrial fibrillation: Paroyxsmal. Emergent DCCV in 6/22. Rhythm regular on exam. < 1% burden on device check - In NSR - Continue amiodarone  100 mg daily. Seeing endocrinology for hyperthyroidism, now off methimazole .  - Continue Xarelto  for now.  3. HTN: BP now low, no room to titrate GDMT as above. 4. 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  Has St Jude CRT-D device now. On amiodarone . No VT on device interrogation today. - Continue amiodarone  100 mg daily.  5. OSA: reports full compliance w/ BiPAP.  - Continue nightly. 6. PE: 04/14/2018, diagnosed by V/Q scan. Has been on Xarelto . No bleeding issues. 7. Anxiety/Panic attacks/depression: Followed by psychiatry.  Symptoms worse since he stopped meds last summer but suspect panic attacks are more likely PND from volume overload.   8. Hyperlipidemia:   - Continue atorvastatin . 9. Obesity:   - Body mass index is 47.5 kg/m.  - He is on Mounjaro . 10. Tobacco use: Smoking about 5 cigarettes daily. Quit in the past. - Discussed need to quit  11. T2DM  - on Mounjaro  and Farxiga  - no change 12. Hyperthyroidism: Suspect related to amiodarone .  Followed by endocrinology but now off methimazole .  TSH mildly elevated with normal free T3 and free T4 on recent check.   13. Aki on CKD stage 3: Creatinine 1.5 -> 1.8 -> 1.9 today  - suspect cardiorenal. - follow closely  - stop IV lasix     Length  of Stay: 2  Toribio Fuel MD 12/18/2024, 10:35 AM  Advanced Heart Failure Team Pager [phone] (M-F; 7a - 4p)  Please contact [facility] for night-coverage after hours (4p -7a ) and weekends on [email or web address]  "

## 2024-12-19 ENCOUNTER — Inpatient Hospital Stay (HOSPITAL_COMMUNITY)

## 2024-12-19 ENCOUNTER — Ambulatory Visit

## 2024-12-19 DIAGNOSIS — M79601 Pain in right arm: Secondary | ICD-10-CM | POA: Diagnosis not present

## 2024-12-19 LAB — POCT I-STAT EG7
Acid-Base Excess: 1 mmol/L (ref 0.0–2.0)
Bicarbonate: 25.6 mmol/L (ref 20.0–28.0)
Calcium, Ion: 1.16 mmol/L (ref 1.15–1.40)
HCT: 40 % (ref 39.0–52.0)
Hemoglobin: 13.6 g/dL (ref 13.0–17.0)
O2 Saturation: 46 %
Potassium: 3.8 mmol/L (ref 3.5–5.1)
Sodium: 142 mmol/L (ref 135–145)
TCO2: 27 mmol/L (ref 22–32)
pCO2, Ven: 41.4 mmHg — ABNORMAL LOW (ref 44–60)
pH, Ven: 7.399 (ref 7.25–7.43)
pO2, Ven: 25 mmHg — CL (ref 32–45)

## 2024-12-19 LAB — COOXEMETRY PANEL
Carboxyhemoglobin: 1.6 % — ABNORMAL HIGH (ref 0.5–1.5)
Methemoglobin: <0.7 % (ref 0.0–1.5)
O2 Saturation: 64.2 %
Total hemoglobin: 15.1 g/dL (ref 12.0–16.0)

## 2024-12-19 LAB — BASIC METABOLIC PANEL WITH GFR
Anion gap: 12 (ref 5–15)
BUN: 35 mg/dL — ABNORMAL HIGH (ref 6–20)
CO2: 29 mmol/L (ref 22–32)
Calcium: 9.2 mg/dL (ref 8.9–10.3)
Chloride: 89 mmol/L — ABNORMAL LOW (ref 98–111)
Creatinine, Ser: 1.82 mg/dL — ABNORMAL HIGH (ref 0.61–1.24)
GFR, Estimated: 43 mL/min — ABNORMAL LOW
Glucose, Bld: 193 mg/dL — ABNORMAL HIGH (ref 70–99)
Potassium: 3.8 mmol/L (ref 3.5–5.1)
Sodium: 129 mmol/L — ABNORMAL LOW (ref 135–145)

## 2024-12-19 LAB — MAGNESIUM: Magnesium: 2.3 mg/dL (ref 1.7–2.4)

## 2024-12-19 MED ORDER — POTASSIUM CHLORIDE CRYS ER 20 MEQ PO TBCR
20.0000 meq | EXTENDED_RELEASE_TABLET | Freq: Once | ORAL | Status: AC
  Administered 2024-12-19: 20 meq via ORAL
  Filled 2024-12-19: qty 1

## 2024-12-19 MED ORDER — TORSEMIDE 20 MG PO TABS
80.0000 mg | ORAL_TABLET | Freq: Every day | ORAL | Status: DC
  Administered 2024-12-19: 80 mg via ORAL
  Filled 2024-12-19: qty 4

## 2024-12-19 NOTE — Plan of Care (Signed)
" °  Problem: Education: Goal: Understanding of CV disease, CV risk reduction, and recovery process will improve Outcome: Progressing Goal: Individualized Educational Video(s) Outcome: Progressing   Problem: Activity: Goal: Ability to return to baseline activity level will improve Outcome: Progressing   Problem: Cardiovascular: Goal: Ability to achieve and maintain adequate cardiovascular perfusion will improve Outcome: Progressing Goal: Vascular access site(s) Level 0-1 will be maintained Outcome: Progressing   Problem: Health Behavior/Discharge Planning: Goal: Ability to safely manage health-related needs after discharge will improve Outcome: Progressing   Problem: Health Behavior/Discharge Planning: Goal: Ability to manage health-related needs will improve Outcome: Progressing   Problem: Clinical Measurements: Goal: Ability to maintain clinical measurements within normal limits will improve Outcome: Progressing Goal: Will remain free from infection Outcome: Progressing Goal: Diagnostic test results will improve Outcome: Progressing Goal: Respiratory complications will improve Outcome: Progressing Goal: Cardiovascular complication will be avoided Outcome: Progressing   Problem: Activity: Goal: Risk for activity intolerance will decrease Outcome: Progressing   Problem: Nutrition: Goal: Adequate nutrition will be maintained Outcome: Progressing   "

## 2024-12-19 NOTE — Plan of Care (Signed)
  Problem: Education: Goal: Understanding of CV disease, CV risk reduction, and recovery process will improve Outcome: Progressing   Problem: Activity: Goal: Ability to return to baseline activity level will improve Outcome: Progressing   Problem: Cardiovascular: Goal: Ability to achieve and maintain adequate cardiovascular perfusion will improve Outcome: Progressing   Problem: Education: Goal: Knowledge of General Education information will improve Description: Including pain rating scale, medication(s)/side effects and non-pharmacologic comfort measures Outcome: Progressing   Problem: Health Behavior/Discharge Planning: Goal: Ability to manage health-related needs will improve Outcome: Progressing

## 2024-12-19 NOTE — Progress Notes (Signed)
 No ICM remote transmission received for 12/19/2024 and next ICM transmission scheduled for 12/27/2024.

## 2024-12-19 NOTE — TOC CM/SW Note (Signed)
 Transition of Care [facility]) - Inpatient Brief Assessment   Patient Details  Name: [name] MRN: [id number] Date of Birth: 1966-06-06  Transition of Care [facility]) CM/SW Contact:    Waddell Barnie Rama, RN Phone Number: 12/19/2024, 4:28 PM   Clinical Narrative: From home with son and ex wife, has PCP and insurance on file, states has no HH services in place at this time , has a cane and bipap at home.  States family member will transport them home at [employer] and family is support system, states gets medications from [facility] in [place].  Pta self ambulatory with cane. He has a bp cuff and a scale, he consumes a low sodium diet.   There are no ICM needs identified  at this time.  Please place consult for ICM needs.  He states he has a sleep study on 1/21 in [place] [phone].  NCM will inform MD of sleep study.   Transition of Care Asessment: Insurance and Status: Insurance coverage has been reviewed Patient has primary care physician: Yes Home environment has been reviewed: home with son and ex wife Prior level of function:: indep Prior/Current Home Services: Current home services (cane, bipap) Social Drivers of Health Review: SDOH reviewed no interventions necessary Readmission risk has been reviewed: Yes Transition of care needs: no transition of care needs at this time

## 2024-12-19 NOTE — Plan of Care (Signed)
" °  Problem: Education: Goal: Understanding of CV disease, CV risk reduction, and recovery process will improve 12/19/2024 2005 by Zelia Sharyne POUR, RN Outcome: Progressing 12/19/2024 2004 by Zelia Sharyne POUR, RN Outcome: Progressing   Problem: Activity: Goal: Ability to return to baseline activity level will improve Outcome: Progressing   Problem: Cardiovascular: Goal: Ability to achieve and maintain adequate cardiovascular perfusion will improve 12/19/2024 2005 by Zelia Sharyne POUR, RN Outcome: Progressing 12/19/2024 2004 by Zelia Sharyne POUR, RN Outcome: Progressing   Problem: Education: Goal: Knowledge of General Education information will improve Description: Including pain rating scale, medication(s)/side effects and non-pharmacologic comfort measures Outcome: Progressing   Problem: Health Behavior/Discharge Planning: Goal: Ability to manage health-related needs will improve 12/19/2024 2005 by Zelia Sharyne POUR, RN Outcome: Progressing 12/19/2024 2004 by Zelia Sharyne POUR, RN Outcome: Progressing   "

## 2024-12-19 NOTE — Progress Notes (Signed)
 Physical Therapy Treatment Patient Details Name: [name] MRN: [id number] DOB: 1966-06-17 Today's Date: 12/19/2024   History of Present Illness Pt is a 59 y.o. M presenting to [facility] on 12/16/24 for RHC surgical intervention. PMH is significant for CHF, A-fib, CKD, hyperthyroidism, T2DM, obesity, anxiety, HLD, gout.    PT Comments  Pt tolerated treatment well today. Session today focused on stair training which pt was able to perform with CGA. Pt overall supervision to ambulate in hallway however reports that he feels back to baseline. DC recs updated to no PT follow up. PT will continue to follow acutely.     If plan is discharge home, recommend the following: A little help with walking and/or transfers;A little help with bathing/dressing/bathroom;Help with stairs or ramp for entrance;Assist for transportation   Can travel by private vehicle        Equipment Recommendations  None recommended by PT    Recommendations for Other Services       Precautions / Restrictions Precautions Precautions: Fall Recall of Precautions/Restrictions: Intact Precaution/Restrictions Comments: PICC, 2L when sleeping Restrictions Weight Bearing Restrictions Per Provider Order: No     Mobility  Bed Mobility               General bed mobility comments: Up in recliner    Transfers Overall transfer level: Independent Equipment used: None                    Ambulation/Gait Ambulation/Gait assistance: Supervision Gait Distance (Feet): 200 Feet Assistive device: None Gait Pattern/deviations: Step-through pattern, Decreased stride length, Trunk flexed Gait velocity: reduced Gait velocity interpretation: 1.31 - 2.62 ft/sec, indicative of limited community ambulator   General Gait Details: Pt with reciprocal gait pattern with decreased speed. no LOB noted. Pt reports that he feels that he is at his baseline.   Stairs Stairs: Yes Stairs assistance: Contact guard assist Stair  Management: No rails, Step to pattern, Forwards, One rail Left Number of Stairs: 3 General stair comments: Limited by IV. No noted noted. Step to pattern.   Wheelchair Mobility     Tilt Bed    Modified Rankin (Stroke Patients Only)       Balance Overall balance assessment: No apparent balance deficits (not formally assessed)                                          Communication Communication Communication: No apparent difficulties  Cognition Arousal: Alert Behavior During Therapy: WFL for tasks assessed/performed   PT - Cognitive impairments: No apparent impairments                         Following commands: Intact      Cueing Cueing Techniques: Verbal cues, Gestural cues  Exercises      General Comments General comments (skin integrity, edema, etc.): SpO2 between 95-99% on RA.      Pertinent Vitals/Pain Pain Assessment Pain Assessment: No/denies pain    Home Living                          Prior Function            PT Goals (current goals can now be found in the care plan section) Progress towards PT goals: Progressing toward goals    Frequency    Min 1X/week  PT Plan      Co-evaluation              AM-PAC PT 6 Clicks Mobility   Outcome Measure  Help needed turning from your back to your side while in a flat bed without using bedrails?: None Help needed moving from lying on your back to sitting on the side of a flat bed without using bedrails?: None Help needed moving to and from a bed to a chair (including a wheelchair)?: A Little Help needed standing up from a chair using your arms (e.g., wheelchair or bedside chair)?: A Little Help needed to walk in hospital room?: A Little Help needed climbing 3-5 steps with a railing? : A Little 6 Click Score: 20    End of Session Equipment Utilized During Treatment: Gait belt;Oxygen  Activity Tolerance: Patient tolerated treatment well Patient  left: Other (comment) (Standing at sink to wash up.) Nurse Communication: Mobility status;Other (comment) (Pt left at sink to wash up) PT Visit Diagnosis: Other abnormalities of gait and mobility (R26.89)     Time: 8542-8490 PT Time Calculation (min) (ACUTE ONLY): 12 min  Charges:    $Gait Training: 8-22 mins PT General Charges $$ ACUTE PT VISIT: 1 Visit                     [name], PT, DPT Acute Rehab Services [phone]    [name] 12/19/2024, 3:57 PM

## 2024-12-19 NOTE — Progress Notes (Addendum)
 "   Advanced Heart Failure Rounding Note   Subjective:     Co-ox 64% on 0.25 milrinone .  IV lasix  stopped 1/18. Scr 1.9>1.8. CVP 7  No shortness of breath. Reports fatigue but states he is always tired.  Echo 1/17 EF < 20% RV moderate to severely HK     Objective:    Vital Signs:   Temp:  [97.4 F (36.3 C)-98.4 F (36.9 C)] 98.4 F (36.9 C) (01/19 0300) Pulse Rate:  [91-105] 104 (01/19 0300) Resp:  [19-29] 19 (01/19 0300) BP: (103-132)/(67-93) 132/93 (01/19 0300) SpO2:  [95 %-100 %] 96 % (01/19 0300) FiO2 (%):  [28 %] 28 % (01/19 0003) Weight:  [151.2 kg] 151.2 kg (01/19 0300) Last BM Date : 12/15/24  Weight change: Filed Weights   12/17/24 0300 12/18/24 0338 12/19/24 0300  Weight: (!) 154.6 kg (!) 145.9 kg (!) 151.2 kg    Intake/Output:   Intake/Output Summary (Last 24 hours) at 12/19/2024 0733 Last data filed at 12/19/2024 0300 Gross per 24 hour  Intake 1797.49 ml  Output 2750 ml  Net -952.51 ml     Physical Exam: General:  Fatigued appearing Neck: JVP 6 cm Cor: Regular rate & rhythm. No murmurs. Lungs: clear Abdomen: obese, nondistended Extremities: 1+ edema, + UNNA Neuro: alert & orientedx3. Affect pleasant    Telemetry: AV paced 90, runs of NSVT  Labs: Basic Metabolic Panel: Recent Labs  Lab 12/14/24 1422 12/16/24 0827 12/16/24 1042 12/17/24 0251 12/18/24 0400 12/19/24 0258  NA 137 141 137 136 134* 129*  K 4.2 3.9 3.7 4.2 3.4* 3.8  CL 103  --  102 97* 92* 89*  CO2 22  --  20* 27 28 29   GLUCOSE 118*  --  156* 132* 197* 193*  BUN 22*  --  25* 30* 33* 35*  CREATININE 1.40*  --  1.57* 1.80* 1.91* 1.82*  CALCIUM  9.0  --  9.1 9.6 9.4 9.2  MG  --   --  2.0 2.5* 2.3 2.3    Liver Function Tests: Recent Labs  Lab 12/14/24 1422 12/16/24 1042  AST 19 24  ALT 15 14  ALKPHOS 143* 148*  BILITOT 0.8 1.2  PROT 8.0 8.4*  ALBUMIN  3.7 3.6   No results for input(s): LIPASE, AMYLASE in the last 168 hours. No results for input(s):  AMMONIA in the last 168 hours.  CBC: Recent Labs  Lab 12/16/24 0636 12/16/24 0827  WBC 8.4  --   HGB 13.3 13.6  HCT 38.3* 40.0  MCV 76.1*  --   PLT 288  --     Cardiac Enzymes: No results for input(s): CKTOTAL, CKMB, CKMBINDEX, TROPONINI in the last 168 hours.  BNP: BNP (last 3 results) Recent Labs    06/30/24 1134 09/19/24 1325 11/07/24 1426  BNP 193.1* 342.3* 310.2*    ProBNP (last 3 results) Recent Labs    12/14/24 1422 12/16/24 1042  PROBNP 1,569.0* 1,982.0*      Other results:  Imaging: ECHOCARDIOGRAM COMPLETE Result Date: 12/17/2024    ECHOCARDIOGRAM REPORT   Patient Name:   [name] Date of Exam: 12/17/2024 Medical Rec #:  [id number]      Height:       69.0 in Accession #:    [id number]     Weight:       340.8 lb Date of Birth:  [birth year]     BSA:          2.591 m Patient Age:    59  years       BP:           116/84 mmHg Patient Gender: M              HR:           82 bpm. Exam Location:  Inpatient Procedure: 2D Echo, Cardiac Doppler, Color Doppler and Intracardiac            Opacification Agent (Both Spectral and Color Flow Doppler were            utilized during procedure). Indications:     CHF- Acute Systolic I50.21  History:         Patient has prior history of Echocardiogram examinations, most                  recent 08/28/2023. CHF, Pacemaker and Defibrillator; Risk                  Factors:Hypertension, Sleep Apnea, Diabetes, CKD and                  Dyslipidemia.  Sonographer:     Koleen Popper RDCS Referring Phys:  EZRA GORMAN SHUCK Diagnosing Phys: Diannah Priya Mallipeddi  Sonographer Comments: Patient is obese. Image acquisition challenging due to patient body habitus. IMPRESSIONS  1. No LV thrombus by Definity . Left ventricular ejection fraction, by estimation, is <20%. Left ventricular ejection fraction by PLAX is 5 %. The left ventricle has severely decreased function. The left ventricle has no regional wall motion abnormalities. The left  ventricular internal cavity size was severely dilated. There is moderate left ventricular hypertrophy of the lateral segment. Left ventricular diastolic parameters are consistent with Grade III diastolic dysfunction (restrictive).  2. Right ventricular systolic function is moderately reduced. The right ventricular size is normal. Tricuspid regurgitation signal is inadequate for assessing PA pressure.  3. Left atrial size was moderately dilated.  4. The mitral valve is normal in structure. No evidence of mitral valve regurgitation. No evidence of mitral stenosis.  5. The aortic valve was not well visualized. Aortic valve regurgitation is mild. No aortic stenosis is present.  6. The inferior vena cava is normal in size with greater than 50% respiratory variability, suggesting right atrial pressure of 3 mmHg. Comparison(s): No significant change from prior study. FINDINGS  Left Ventricle: No LV thrombus by Definity . Left ventricular ejection fraction, by estimation, is <20%. Left ventricular ejection fraction by PLAX is 5 %. The left ventricle has severely decreased function. The left ventricle has no regional wall motion  abnormalities. Definity  contrast agent was given IV to delineate the left ventricular endocardial borders. Strain was performed and the global longitudinal strain is indeterminate. The left ventricular internal cavity size was severely dilated. There is  moderate left ventricular hypertrophy of the lateral segment. Left ventricular diastolic parameters are consistent with Grade III diastolic dysfunction (restrictive). Right Ventricle: The right ventricular size is normal. No increase in right ventricular wall thickness. Right ventricular systolic function is moderately reduced. Tricuspid regurgitation signal is inadequate for assessing PA pressure. Left Atrium: Left atrial size was moderately dilated. Right Atrium: Right atrial size was normal in size. Pericardium: There is no evidence of pericardial  effusion. Mitral Valve: The mitral valve is normal in structure. No evidence of mitral valve regurgitation. No evidence of mitral valve stenosis. Tricuspid Valve: The tricuspid valve is normal in structure. Tricuspid valve regurgitation is not demonstrated. No evidence of tricuspid stenosis. Aortic Valve: The aortic valve was not well  visualized. Aortic valve regurgitation is mild. No aortic stenosis is present. Pulmonic Valve: The pulmonic valve was grossly normal. Pulmonic valve regurgitation is trivial. No evidence of pulmonic stenosis. Aorta: The aortic root and ascending aorta are structurally normal, with no evidence of dilitation. Venous: The inferior vena cava is normal in size with greater than 50% respiratory variability, suggesting right atrial pressure of 3 mmHg. IAS/Shunts: No atrial level shunt detected by color flow Doppler. Additional Comments: 3D was performed not requiring image post processing on an independent workstation and was indeterminate. A device lead is visualized.  LEFT VENTRICLE PLAX 2D LV EF:         Left            Diastology                ventricular     LV e' medial:    5.87 cm/s                ejection        LV E/e' medial:  13.2                fraction by     LV e' lateral:   10.40 cm/s                PLAX is 5 %.    LV E/e' lateral: 7.4 LVIDd:         8.10 cm LVIDs:         7.90 cm LV PW:         1.50 cm LV IVS:        1.20 cm LVOT diam:     2.10 cm LV SV:         38 LV SV Index:   15 LVOT Area:     3.46 cm  LV Volumes (MOD) LV vol d, MOD    501.0 ml A4C: LV vol s, MOD    459.0 ml A4C: LV SV MOD A4C:   501.0 ml IVC IVC diam: 1.90 cm LEFT ATRIUM              Index LA diam:        5.30 cm  2.05 cm/m LA Vol (A2C):   96.6 ml  37.28 ml/m LA Vol (A4C):   105.0 ml 40.52 ml/m LA Biplane Vol: 105.0 ml 40.52 ml/m  AORTIC VALVE LVOT Vmax:   76.60 cm/s LVOT Vmean:  51.100 cm/s LVOT VTI:    0.111 m  AORTA Ao Root diam: 3.60 cm Ao Asc diam:  3.90 cm MITRAL VALVE MV Area (PHT): 6.32 cm     SHUNTS MV Decel Time: 120 msec    Systemic VTI:  0.11 m MR Peak grad: 78.9 mmHg    Systemic Diam: 2.10 cm MR Vmax:      444.00 cm/s MV E velocity: 77.30 cm/s MV A velocity: 37.40 cm/s MV E/A ratio:  2.07 Vishnu Priya Mallipeddi Electronically signed by Diannah Late Mallipeddi Signature Date/Time: 12/17/2024/2:38:14 PM    Final (Updated)    DG CHEST PORT 1 VIEW Result Date: 12/17/2024 CLINICAL DATA:  Status post PICC line placement. EXAM: PORTABLE CHEST 1 VIEW COMPARISON:  12/17/2024. FINDINGS: The heart is enlarged and the mediastinal contour stable. The pulmonary vasculature is distended. No consolidation, effusion, or pneumothorax is seen. A multi lead pacemaker device is present over the left chest. A right internal jugular central venous catheter terminates over the superior vena cava. IMPRESSION: 1. Cardiomegaly with pulmonary vascular congestion.  2. Right PICC line terminates over the superior vena cava. Electronically Signed   By: Leita Birmingham M.D.   On: 12/17/2024 14:01     Medications:     Scheduled Medications:  amiodarone   100 mg Oral Daily   atorvastatin   40 mg Oral Daily   Chlorhexidine  Gluconate Cloth  6 each Topical Daily   dapagliflozin  propanediol  10 mg Oral Daily   pantoprazole   40 mg Oral Daily   rivaroxaban   20 mg Oral Q supper   sodium chloride  flush  10-40 mL Intracatheter Q12H   sodium chloride  flush  3 mL Intravenous Q12H   spironolactone   25 mg Oral Daily   traZODone   50 mg Oral QHS    Infusions:  milrinone  0.25 mcg/kg/min (12/19/24 0252)    PRN Medications: acetaminophen , cyclobenzaprine , melatonin, ondansetron  (ZOFRAN ) IV, sodium chloride  flush, sodium chloride  flush   Assessment/Plan:   1.  Acute on chronic systolic CHF with low output: Nonischemic cardiomyopathy. ?If ETOH has played a role (now drinks rarely). Echo (3/18) with EF 25-30%. Echo 7/19 and in 2/21 with EF 20-25%. Echo 6/22 with EF < 20%, normal RV.  Has St Jude CRT-D device now.  Echo-post  CRT (8/23) showed EF < 20%, mild MR, RV normal.  CPX in 9/23 with moderate functional limitation due to HF and body habitus. Echo in 9/24 showed  EF <20% with severe LV dilation, normal RV size/function, no significant MR.  CPX in 10/24 showed mild to moderate HF limitation but RHC in 10/24 showed low output (1.69 thermo/2.05 Fick).  Medication titration has been limited by hypotension.  He was worked up for LVAD after RHC in 10/24 (not transplant candidate with weight and smoking), but he decided that he did not want to pursue as his symptoms were minimal.  Today, he is markedly volume overloaded with NYHA class IIIb symptoms.  I think that his panic attacks are PND.   - RHC 1/16 RA 18 PA 83/40 (54) PCWP 41 Fick 4.0/1.5 TD 4.2/1.6 PVR 3.09 WU PAPi 2.4  - Echo 12/17/24: EF < 20% RV moderate to severely HK  - Remains on milrinone  0.25 with co-ox 64%. Wean milrinone  to 0.125. - Off lasix  gtt. CVP 7. Start po Torsemide  80 mg daily.  - Continue UNNA - Unable to tolerate losartan  due to low BP (off).   - Off Coreg  with volume overload and low output.  - Continue spiro 25 mg daily - Continue farxiga  10 mg daily - No Entresto, ACEI with h/o angioedema.  - Headaches with Bidil, cannot take. Could consider hydralazine  alone? - He did not tolerate digoxin .   - Insurance denied baroreceptor activation therapy.  - He remains resistant to LVAD (and with RV dysfunction may not be good candidate) His weight and social situation will make him a difficult candidate as well. VAD coordinators have seen  2. Atrial fibrillation: Paroyxsmal. Emergent DCCV in 6/22. Rhythm regular on exam. < 1% burden on device check - In NSR - Continue amiodarone  100 mg daily. Seeing endocrinology for hyperthyroidism, now off methimazole .  - Continue Xarelto  for now.  3. HTN: BP now on lower end, no room to titrate GDMT as above. 4. 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  Has St Jude CRT-D device now. On amiodarone .  - More  runs of NSVT on tele - Continue amiodarone  100 mg daily. Check TSH. May need to increase amio if burden does not improve as we wean milrinone  5. OSA: reports full compliance w/ BiPAP.  -  Continue nightly. 6. PE: 04/14/2018, diagnosed by V/Q scan. Has been on Xarelto . No bleeding issues. 7. Anxiety/Panic attacks/depression: Followed by psychiatry.  Symptoms worse since he stopped meds last summer but suspect panic attacks are more likely PND from volume overload.   8. Hyperlipidemia:   - Continue atorvastatin . 9. Obesity:   - Body mass index is 49.23 kg/m.  - He is on Mounjaro . 10. Tobacco use: Smoking about 5 cigarettes daily. Quit in the past. - Discussed need to quit  11. T2DM  - on Mounjaro  and Farxiga  - no change 12. Hyperthyroidism: Suspect related to amiodarone .  Followed by endocrinology but now off methimazole .  TSH mildly elevated with normal free T3 and free T4 on recent check.   13. Aki on CKD stage 3: Creatinine 1.8 - suspect cardiorenal. - follow closely   Length of Stay: 3  FINCH, LINDSAY N MD 12/19/2024, 7:33 AM  Advanced Heart Failure Team Pager [phone] (M-F; 7a - 4p)  Please contact [facility] for night-coverage after hours (4p -7a ) and weekends on [email or web address]  Patient seen and examined with the above-signed Advanced Practice Provider and/or Housestaff. I personally reviewed laboratory data, imaging studies and relevant notes. I independently examined the patient and formulated the important aspects of the plan. I have edited the note to reflect any of my changes or salient points. I have personally discussed the plan with the patient and/or family.  Remains on milrinone . Co-ox improved. CVP 7 now off lasix  gtt. Breathing better. Having cramps   General:  Sitting up in chair. No resp difficulty HEENT: normal Neck: supple. no JVD.  Cor: Regular rate & rhythm. No rubs, gallops or murmurs. Lungs: clear Abdomen: obese soft, nontender, nondistended.Good bowel  sounds. Extremities: no cyanosis, clubbing, rash, edema Neuro: alert & orientedx3, cranial nerves grossly intact. moves all 4 extremities w/o difficulty. Affect pleasant  He is improved with milrinone . Now fully diuresed.   He remains resistant to VAD and has multiple complicating issues as well (obesity, RV failure, CKD, compliance).  Will attempt to wean milrinone  and see how he does. If co-ox drops significantly will need  to reconsider if he is candidate for advanced therapies. Check thyroid  labs today.   Toribio Fuel, MD  2:09 PM     "

## 2024-12-19 NOTE — Progress Notes (Signed)
 RUE venous duplex has been completed.   Results can be found under chart review under CV PROC. 12/19/2024 4:51 PM Cormac Wint RVT, RDMS

## 2024-12-20 LAB — COOXEMETRY PANEL
Carboxyhemoglobin: 1.3 % (ref 0.5–1.5)
Carboxyhemoglobin: 1.3 % (ref 0.5–1.5)
Methemoglobin: 0.7 % (ref 0.0–1.5)
Methemoglobin: <0.7 % (ref 0.0–1.5)
O2 Saturation: 43.7 %
O2 Saturation: 60.1 %
Total hemoglobin: 15.1 g/dL (ref 12.0–16.0)
Total hemoglobin: 14.7 g/dL (ref 12.0–16.0)

## 2024-12-20 LAB — T4, FREE: Free T4: 1.33 ng/dL (ref 0.80–2.00)

## 2024-12-20 LAB — BASIC METABOLIC PANEL WITH GFR
Anion gap: 11 (ref 5–15)
BUN: 40 mg/dL — ABNORMAL HIGH (ref 6–20)
CO2: 31 mmol/L (ref 22–32)
Calcium: 9.2 mg/dL (ref 8.9–10.3)
Chloride: 93 mmol/L — ABNORMAL LOW (ref 98–111)
Creatinine, Ser: 1.94 mg/dL — ABNORMAL HIGH (ref 0.61–1.24)
GFR, Estimated: 40 mL/min — ABNORMAL LOW
Glucose, Bld: 137 mg/dL — ABNORMAL HIGH (ref 70–99)
Potassium: 4.3 mmol/L (ref 3.5–5.1)
Sodium: 134 mmol/L — ABNORMAL LOW (ref 135–145)

## 2024-12-20 LAB — MAGNESIUM: Magnesium: 2.5 mg/dL — ABNORMAL HIGH (ref 1.7–2.4)

## 2024-12-20 LAB — TSH: TSH: 4.25 u[IU]/mL (ref 0.350–4.500)

## 2024-12-20 MED ORDER — TORSEMIDE 20 MG PO TABS
80.0000 mg | ORAL_TABLET | Freq: Every day | ORAL | Status: DC
  Administered 2024-12-20 – 2024-12-21 (×2): 80 mg via ORAL
  Filled 2024-12-20 (×2): qty 4

## 2024-12-20 MED ORDER — TORSEMIDE 20 MG PO TABS
40.0000 mg | ORAL_TABLET | Freq: Every day | ORAL | Status: DC
  Administered 2024-12-20: 40 mg via ORAL
  Filled 2024-12-20: qty 2

## 2024-12-20 NOTE — Progress Notes (Signed)
 Mobility Specialist Progress Note:    12/20/24 1257  Mobility  Activity Ambulated with assistance;Stood with assistance (Standing Marches, Mini Squats)  Level of Assistance Standby assist, set-up cues, supervision of patient - no hands on  Assistive Device None  Distance Ambulated (ft) 15 ft  Range of Motion/Exercises Active  Activity Response Tolerated well  Mobility Referral Yes  Mobility visit 1 Mobility  Mobility Specialist Start Time (ACUTE ONLY) 1257  Mobility Specialist Stop Time (ACUTE ONLY) 1319  Mobility Specialist Time Calculation (min) (ACUTE ONLY) 22 min   Received pt sitting in recliner agreeable to in room ambulation d/t expecting family. No c/o any symptoms. Pt ambulate through twice before also performing standing marches and mini squats. Pt able to perform movements well w/ no SOB. Returned pt to recliner w/ all needs met.   Venetia Keel Mobility Specialist Please Neurosurgeon or Rehab Office at [phone]

## 2024-12-20 NOTE — Progress Notes (Addendum)
 "   Advanced Heart Failure Rounding Note  Chief Complaint:  Subjective:    1/16: RHC with RA 18 PA 83/40 (54) PCWP 41 Fick 4.0/1.5 TD 4.2/1.6 PVR 3.09 WU PAPi 2.4  1/17: Echo with EF < 20% RV moderate to severely HK  1/18: IV lasix  stopped 1/18. sCr 1.8-1.9 1/19: milrinone  weaned to 0.125  Co-ox 44% on 0.125 milrinone . CVP 11  Lying in bed, feels ok. No shortness of breath. Does report early satiety. Talked with him and his mother via phone about his labs this morning and how they relate to his heart failure.   Objective:    Vital Signs:   Temp:  [97.5 F (36.4 C)-98.2 F (36.8 C)] 98 F (36.7 C) (01/20 0751) Pulse Rate:  [87-99] 87 (01/20 0751) Resp:  [13-28] 16 (01/20 0751) BP: (100-161)/(55-143) 161/143 (01/20 0751) SpO2:  [93 %-100 %] 97 % (01/20 0751) Weight:  [150.9 kg] 150.9 kg (01/20 0300) Last BM Date : 12/20/23  Weight change: Filed Weights   12/18/24 0338 12/19/24 0300 12/20/24 0300  Weight: (!) 145.9 kg (!) 151.2 kg (!) 150.9 kg   Intake/Output:  Intake/Output Summary (Last 24 hours) at 12/20/2024 0802 Last data filed at 12/20/2024 0751 Gross per 24 hour  Intake 1523.97 ml  Output 3775 ml  Net -2251.03 ml    Physical Exam: General: Well appearing. No distress  Cardiac: JVP difficult to assess. No murmurs  Abdomen: Soft, non-distended.  Extremities: Warm and dry.  No peripheral edema.  Neuro: A&O x3. Affect pleasant.   Telemetry: VP 80s, 6b run NSVT (personally reviewed)  Labs: Basic Metabolic Panel: Recent Labs  Lab 12/16/24 1042 12/17/24 0251 12/18/24 0400 12/19/24 0258 12/20/24 0543  NA 137 136 134* 129* 134*  K 3.7 4.2 3.4* 3.8 4.3  CL 102 97* 92* 89* 93*  CO2 20* 27 28 29 31   GLUCOSE 156* 132* 197* 193* 137*  BUN 25* 30* 33* 35* 40*  CREATININE 1.57* 1.80* 1.91* 1.82* 1.94*  CALCIUM  9.1 9.6 9.4 9.2 9.2  MG 2.0 2.5* 2.3 2.3 2.5*    Liver Function Tests: Recent Labs  Lab 12/14/24 1422 12/16/24 1042  AST 19 24  ALT 15 14   ALKPHOS 143* 148*  BILITOT 0.8 1.2  PROT 8.0 8.4*  ALBUMIN  3.7 3.6   No results for input(s): LIPASE, AMYLASE in the last 168 hours. No results for input(s): AMMONIA in the last 168 hours.  CBC: Recent Labs  Lab 12/16/24 0636 12/16/24 0827  WBC 8.4  --   HGB 13.3 13.6  13.6  HCT 38.3* 40.0  40.0  MCV 76.1*  --   PLT 288  --    Cardiac Enzymes: No results for input(s): CKTOTAL, CKMB, CKMBINDEX, TROPONINI in the last 168 hours.  BNP (last 3 results) Recent Labs    06/30/24 1134 09/19/24 1325 11/07/24 1426  BNP 193.1* 342.3* 310.2*   ProBNP (last 3 results) Recent Labs    12/14/24 1422 12/16/24 1042  PROBNP 1,569.0* 1,982.0*   Other results:  Imaging: VAS US  UPPER EXTREMITY VENOUS DUPLEX Result Date: 12/19/2024 UPPER VENOUS STUDY  Patient Name:  BRINTON BRANDEL  Date of Exam:   12/19/2024 Medical Rec #: [id number]       Accession #:    [id number] Date of Birth: 1966/09/02      Patient Gender: M Patient Age:   59 years Exam Location:  [facility] Procedure:      VAS US  UPPER EXTREMITY VENOUS DUPLEX Referring Phys:  LINDSAY FINCH --------------------------------------------------------------------------------  Indications: Pain Other Indications: Recent PICC line place to RUE. Comparison Study: Previous exam on 12/23/2022 was negative for DVT. Performing Technologist: Ezzie Potters RVT, RDMS  Examination Guidelines: A complete evaluation includes B-mode imaging, spectral Doppler, color Doppler, and power Doppler as needed of all accessible portions of each vessel. Bilateral testing is considered an integral part of a complete examination. Limited examinations for reoccurring indications may be performed as noted.  Right Findings: +----------+------------+---------+-----------+----------+-------+ RIGHT     CompressiblePhasicitySpontaneousPropertiesSummary +----------+------------+---------+-----------+----------+-------+ IJV           Full       Yes        Yes                      +----------+------------+---------+-----------+----------+-------+ Subclavian    Full       Yes       Yes                      +----------+------------+---------+-----------+----------+-------+ Axillary      Full       Yes       Yes                      +----------+------------+---------+-----------+----------+-------+ Brachial      Full       Yes       Yes                      +----------+------------+---------+-----------+----------+-------+ Radial        Full                                          +----------+------------+---------+-----------+----------+-------+ Ulnar         Full                                          +----------+------------+---------+-----------+----------+-------+ Cephalic      Full                                          +----------+------------+---------+-----------+----------+-------+ Basilic       Full       Yes       Yes                      +----------+------------+---------+-----------+----------+-------+ Limited visualization of basilic and brachial veins due to PICC line placement. Visualized areas are compressible with color and phasic flow.  Left Findings: +----------+------------+---------+-----------+----------+--------------------+ LEFT      CompressiblePhasicitySpontaneousProperties      Summary        +----------+------------+---------+-----------+----------+--------------------+ Subclavian               Yes       Yes                   patent by  color/doppler     +----------+------------+---------+-----------+----------+--------------------+  Summary:  Right: No evidence of deep vein thrombosis in the upper extremity. No evidence of superficial vein thrombosis in the upper extremity.  Left: No evidence of thrombosis in the subclavian.  *See table(s) above for measurements and observations.     Preliminary     Medications:    Scheduled Medications:  amiodarone   100 mg Oral Daily   atorvastatin   40 mg Oral Daily   Chlorhexidine  Gluconate Cloth  6 each Topical Daily   dapagliflozin  propanediol  10 mg Oral Daily   pantoprazole   40 mg Oral Daily   rivaroxaban   20 mg Oral Q supper   sodium chloride  flush  10-40 mL Intracatheter Q12H   sodium chloride  flush  3 mL Intravenous Q12H   spironolactone   25 mg Oral Daily   torsemide   80 mg Oral Daily   traZODone   50 mg Oral QHS    Infusions:  milrinone  0.125 mcg/kg/min (12/20/24 0521)    PRN Medications: acetaminophen , cyclobenzaprine , melatonin, ondansetron  (ZOFRAN ) IV, sodium chloride  flush, sodium chloride  flush  Assessment/Plan:   1.  Acute on chronic systolic CHF with low output: Nonischemic cardiomyopathy. ?If ETOH has played a role (now drinks rarely). Echo (3/18) with EF 25-30%. Echo 7/19 and in 2/21 with EF 20-25%. Echo 6/22 with EF < 20%, normal RV.  Has St Jude CRT-D device now.  Echo-post CRT (8/23) showed EF < 20%, mild MR, RV normal.  CPX in 9/23 with moderate functional limitation due to HF and body habitus. Echo in 9/24 showed  EF <20% with severe LV dilation, normal RV size/function, no significant MR.  CPX in 10/24 showed mild to moderate HF limitation but RHC in 10/24 showed low output (1.69 thermo/2.05 Fick).  Medication titration has been limited by hypotension.  He was worked up for LVAD after RHC in 10/24 (not transplant candidate with weight and smoking), but he decided that he did not want to pursue as his symptoms were minimal.   Suspect that his panic attacks are PND.   - RHC 1/16 RA 18 PA 83/40 (54) PCWP 41 Fick 4.0/1.5 TD 4.2/1.6 PVR 3.09 WU PAPi 2.4  - Echo 1/17: EF < 20% RV moderate to severely HK  - Co-ox now 44% after milrinone  wean to 0.125. Repeat co-ox STAT, if truly low, increase milrinone  to 0.25 - CVP up to 11 from 7. Increase torsemide  to 80 qam and 40 qpm. - Continue spiro 25 mg daily - Continue farxiga  10  mg daily - No Entresto, ACEI with h/o angioedema.  - Unable to tolerate losartan  due to low BP (off).   - Off Coreg  with volume overload and low output.  - Headaches with Bidil, cannot take. Could consider hydralazine  alone? - He did not tolerate digoxin .   - Insurance denied baroreceptor activation therapy.  - He remains resistant to LVAD (and with RV dysfunction may not be good candidate) His weight and social situation will make him a difficult candidate as well. VAD coordinators have seen. May need to rediscuss advanced therapies.  2. Atrial fibrillation: Paroyxsmal. Emergent DCCV in 6/22. Rhythm regular on exam. < 1% burden on device check - Remains in NSR - Continue amiodarone  100 mg daily. Seeing endocrinology for hyperthyroidism, now off methimazole .  - Thyroid  labs ok. - Continue Xarelto  for now.  3. HTN: BP now on lower end, no room to titrate GDMT as above. 4. 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  Has St Jude CRT-D device now. On  amiodarone .  - NSVT appears to be improving, 1 short 6 b run overnight.  - Continue amiodarone  100 mg daily. TSH ok.  5. OSA: reports full compliance w/ BiPAP.  - Continue nightly. 6. PE: 04/14/2018, diagnosed by V/Q scan. Has been on Xarelto . No bleeding issues. 7. Anxiety/Panic attacks/depression: Followed by psychiatry.  Symptoms worse since he stopped meds last summer but suspect panic attacks are more likely PND from volume overload.   8. Hyperlipidemia:   - Continue atorvastatin . 9. Obesity:   - Body mass index is 49.13 kg/m.  - He is on Mounjaro . 10. Tobacco use: Smoking about 5 cigarettes daily. Quit in the past. - Discussed need to quit  11. T2DM  - on Mounjaro  and Farxiga  - no change 12. Hyperthyroidism: Suspect related to amiodarone .  Followed by endocrinology but now off methimazole .  Thyroid  studies normal. 13. AKI on CKD stage 3: Creatinine 1.8-1.9 - suspect cardiorenal. - follow closely   Length of Stay: 4  Jordan  Lee MD 12/20/2024, 8:02 AM  Advanced Heart Failure Team Pager [phone] (M-F; 7a - 4p)  Please contact [facility] for night-coverage after hours (4p -7a ) and weekends on [email or web address]  Patient seen and examined with the above-signed Advanced Practice Provider and/or Housestaff. I personally reviewed laboratory data, imaging studies and relevant notes. I independently examined the patient and formulated the important aspects of the plan. I have edited the note to reflect any of my changes or salient points. I have personally discussed the plan with the patient and/or family.  Milrinone  turned down to yesterday. Co-ox down to 44% today, CVP up. Doesn;t feel as good.   General:  Sitting in chair No resp difficulty HEENT: normal Neck: supple. JVP 11 Cor: Regular rate & rhythm. No rubs, gallops or murmurs. Lungs: clear Abdomen: obese soft, nontender, nondistended.Good bowel sounds. Extremities: no cyanosis, clubbing, rash, tr edema Neuro: alert & orientedx3, cranial nerves grossly intact. moves all 4 extremities w/o difficulty. Affect pleasant  He is failing milrinone  wean. Will repeat co-ox. If persistently low will need to increase milrinone  back up and see if he may be candidate for VAD. (Multiple obstacles including weight, RV dysfunction, CKD)   Toribio Fuel, MD  10:09 AM   "

## 2024-12-20 NOTE — Care Management Important Message (Signed)
 Important Message  Patient Details  Name: [name] MRN: [id number] Date of Birth: 01/31/1966   Important Message Given:  Yes - Medicare IM     Vonzell Arrie Sharps 12/20/2024, 8:07 AM

## 2024-12-20 NOTE — Progress Notes (Signed)
 Orthopedic Tech Progress Note Patient Details:  [name] 08-17-1966 [id number]  We are currently out of UNNA PASTE  Patient ID: [name], male   DOB: 22-Oct-1966, 59 y.o.   MRN: [id number]  [name] 12/20/2024, 7:58 AM

## 2024-12-20 NOTE — Plan of Care (Signed)
" °  Problem: Activity: Goal: Ability to return to baseline activity level will improve Outcome: Progressing   Problem: Education: Goal: Knowledge of General Education information will improve Description: Including pain rating scale, medication(s)/side effects and non-pharmacologic comfort measures Outcome: Progressing   Problem: Elimination: Goal: Will not experience complications related to bowel motility Outcome: Progressing Goal: Will not experience complications related to urinary retention Outcome: Progressing   Problem: Education: Goal: Understanding of CV disease, CV risk reduction, and recovery process will improve Outcome: Not Progressing   Problem: Clinical Measurements: Goal: Ability to maintain clinical measurements within normal limits will improve Outcome: Not Progressing   "

## 2024-12-21 ENCOUNTER — Other Ambulatory Visit (HOSPITAL_COMMUNITY): Payer: Self-pay

## 2024-12-21 LAB — BASIC METABOLIC PANEL WITH GFR
Anion gap: 11 (ref 5–15)
BUN: 43 mg/dL — ABNORMAL HIGH (ref 6–20)
CO2: 31 mmol/L (ref 22–32)
Calcium: 9.1 mg/dL (ref 8.9–10.3)
Chloride: 92 mmol/L — ABNORMAL LOW (ref 98–111)
Creatinine, Ser: 1.65 mg/dL — ABNORMAL HIGH (ref 0.61–1.24)
GFR, Estimated: 48 mL/min — ABNORMAL LOW
Glucose, Bld: 174 mg/dL — ABNORMAL HIGH (ref 70–99)
Potassium: 3.3 mmol/L — ABNORMAL LOW (ref 3.5–5.1)
Sodium: 134 mmol/L — ABNORMAL LOW (ref 135–145)

## 2024-12-21 LAB — COOXEMETRY PANEL
Carboxyhemoglobin: 1.2 % (ref 0.5–1.5)
Carboxyhemoglobin: 1.4 % (ref 0.5–1.5)
Methemoglobin: <0.7 % (ref 0.0–1.5)
Methemoglobin: <0.7 % (ref 0.0–1.5)
O2 Saturation: 65.5 %
O2 Saturation: 66.3 %
Total hemoglobin: 16.6 g/dL — ABNORMAL HIGH (ref 12.0–16.0)
Total hemoglobin: 15 g/dL (ref 12.0–16.0)

## 2024-12-21 LAB — MAGNESIUM: Magnesium: 2.3 mg/dL (ref 1.7–2.4)

## 2024-12-21 MED ORDER — POTASSIUM CHLORIDE CRYS ER 20 MEQ PO TBCR
40.0000 meq | EXTENDED_RELEASE_TABLET | Freq: Two times a day (BID) | ORAL | 3 refills | Status: DC
  Filled 2024-12-21: qty 180, 45d supply, fill #0

## 2024-12-21 MED ORDER — POTASSIUM CHLORIDE CRYS ER 20 MEQ PO TBCR
40.0000 meq | EXTENDED_RELEASE_TABLET | ORAL | Status: AC
  Administered 2024-12-21 (×2): 40 meq via ORAL
  Filled 2024-12-21 (×2): qty 2

## 2024-12-21 MED ORDER — DAPAGLIFLOZIN PROPANEDIOL 10 MG PO TABS
10.0000 mg | ORAL_TABLET | Freq: Every day | ORAL | 3 refills | Status: AC
  Filled 2024-12-21: qty 90, 90d supply, fill #0

## 2024-12-21 MED ORDER — ACETAMINOPHEN 325 MG PO TABS: 650.0000 mg | ORAL_TABLET | ORAL | Status: AC | PRN

## 2024-12-21 MED ORDER — TORSEMIDE 20 MG PO TABS
ORAL_TABLET | ORAL | 6 refills | Status: DC
  Filled 2024-12-21: qty 200, 30d supply, fill #0

## 2024-12-21 MED ORDER — POTASSIUM CHLORIDE CRYS ER 20 MEQ PO TBCR
40.0000 meq | EXTENDED_RELEASE_TABLET | Freq: Every day | ORAL | 3 refills | Status: DC
  Filled 2024-12-21: qty 180, 90d supply, fill #0

## 2024-12-21 MED ORDER — AMIODARONE HCL 200 MG PO TABS
100.0000 mg | ORAL_TABLET | Freq: Every day | ORAL | 6 refills | Status: AC
  Filled 2024-12-21: qty 15, 30d supply, fill #0

## 2024-12-21 NOTE — Progress Notes (Signed)
 Patient has received Paperwork, I removed, Tele monitor removed, NT transporting to pick up meds from [facility] pharmacy.

## 2024-12-21 NOTE — Progress Notes (Addendum)
 "   Advanced Heart Failure Rounding Note  HF MD: Dr. Rolan Chief Complaint: A/C HFrEF Subjective:    1/16: RHC with RA 18 PA 83/40 (54) PCWP 41 Fick 4.0/1.5 TD 4.2/1.6 PVR 3.09 WU PAPi 2.4  1/17: Echo with EF < 20% RV moderate to severely HK  1/18: IV lasix  stopped 1/18. sCr 1.8-1.9 1/19: milrinone  weaned to 0.125  Co-ox 66% on milrinone  0.125 mcg/kg/min. 3.7L UOP on torsemide  80/40 mg and his weight is down 1 lb. CVP <5. VS stable. sCr 1.94>1.65. Did not sleep well overnight, very drowsy on exam. No other complaints, no shortness of breath. Feels like he is ready to go home.   Objective:    Vital Signs:   Temp:  [97.6 F (36.4 C)-98.1 F (36.7 C)] 98 F (36.7 C) (01/21 0432) Pulse Rate:  [80-89] 89 (01/21 0432) Resp:  [16-20] 18 (01/21 0432) BP: (112-161)/(78-143) 115/85 (01/21 0432) SpO2:  [97 %-100 %] 99 % (01/21 0432) Weight:  [150.5 kg] 150.5 kg (01/21 0432) Last BM Date : 12/17/24  Weight change: Filed Weights   12/19/24 0300 12/20/24 0300 12/21/24 0432  Weight: (!) 151.2 kg (!) 150.9 kg (!) 150.5 kg   Intake/Output:  Intake/Output Summary (Last 24 hours) at 12/21/2024 0711 Last data filed at 12/21/2024 0435 Gross per 24 hour  Intake 841 ml  Output 3700 ml  Net -2859 ml    Physical Exam: General: Well appearing. No distress  Cardiac: JVP flat. No murmurs  Abdomen: Soft, non-distended.  Extremities: Warm and dry.  No peripheral edema.  Neuro: A&O x3. Affect pleasant.   Telemetry: VP 90s (personally reviewed)  Labs: Basic Metabolic Panel: Recent Labs  Lab 12/17/24 0251 12/18/24 0400 12/19/24 0258 12/20/24 0543 12/21/24 0555  NA 136 134* 129* 134* 134*  K 4.2 3.4* 3.8 4.3 3.3*  CL 97* 92* 89* 93* 92*  CO2 27 28 29 31 31   GLUCOSE 132* 197* 193* 137* 174*  BUN 30* 33* 35* 40* 43*  CREATININE 1.80* 1.91* 1.82* 1.94* 1.65*  CALCIUM  9.6 9.4 9.2 9.2 9.1  MG 2.5* 2.3 2.3 2.5* 2.3    Liver Function Tests: Recent Labs  Lab 12/14/24 1422  12/16/24 1042  AST 19 24  ALT 15 14  ALKPHOS 143* 148*  BILITOT 0.8 1.2  PROT 8.0 8.4*  ALBUMIN  3.7 3.6   No results for input(s): LIPASE, AMYLASE in the last 168 hours. No results for input(s): AMMONIA in the last 168 hours.  CBC: Recent Labs  Lab 12/16/24 0636 12/16/24 0827  WBC 8.4  --   HGB 13.3 13.6  13.6  HCT 38.3* 40.0  40.0  MCV 76.1*  --   PLT 288  --    Cardiac Enzymes: No results for input(s): CKTOTAL, CKMB, CKMBINDEX, TROPONINI in the last 168 hours.  BNP (last 3 results) Recent Labs    06/30/24 1134 09/19/24 1325 11/07/24 1426  BNP 193.1* 342.3* 310.2*   ProBNP (last 3 results) Recent Labs    12/14/24 1422 12/16/24 1042  PROBNP 1,569.0* 1,982.0*   Other results:  Imaging: VAS US  UPPER EXTREMITY VENOUS DUPLEX Result Date: 12/20/2024 UPPER VENOUS STUDY  Patient Name:  [name]  Date of Exam:   12/19/2024 Medical Rec #: [id number]       Accession #:    [id number] Date of Birth: 1966-04-14      Patient Gender: M Patient Age:   59 years Exam Location:  [facility] Procedure:      VAS  US  UPPER EXTREMITY VENOUS DUPLEX Referring Phys: [facility] --------------------------------------------------------------------------------  Indications: Pain Other Indications: Recent PICC line place to RUE. Comparison Study: Previous exam on 12/23/2022 was negative for DVT. Performing Technologist: Ezzie Potters RVT, RDMS  Examination Guidelines: A complete evaluation includes B-mode imaging, spectral Doppler, color Doppler, and power Doppler as needed of all accessible portions of each vessel. Bilateral testing is considered an integral part of a complete examination. Limited examinations for reoccurring indications may be performed as noted.  Right Findings: +----------+------------+---------+-----------+----------+-------+ RIGHT     CompressiblePhasicitySpontaneousPropertiesSummary  +----------+------------+---------+-----------+----------+-------+ IJV           Full       Yes       Yes                      +----------+------------+---------+-----------+----------+-------+ Subclavian    Full       Yes       Yes                      +----------+------------+---------+-----------+----------+-------+ Axillary      Full       Yes       Yes                      +----------+------------+---------+-----------+----------+-------+ Brachial      Full       Yes       Yes                      +----------+------------+---------+-----------+----------+-------+ Radial        Full                                          +----------+------------+---------+-----------+----------+-------+ Ulnar         Full                                          +----------+------------+---------+-----------+----------+-------+ Cephalic      Full                                          +----------+------------+---------+-----------+----------+-------+ Basilic       Full       Yes       Yes                      +----------+------------+---------+-----------+----------+-------+ Limited visualization of basilic and brachial veins due to PICC line placement. Visualized areas are compressible with color and phasic flow.  Left Findings: +----------+------------+---------+-----------+----------+--------------------+ LEFT      CompressiblePhasicitySpontaneousProperties      Summary        +----------+------------+---------+-----------+----------+--------------------+ Subclavian               Yes       Yes                   patent by  color/doppler     +----------+------------+---------+-----------+----------+--------------------+  Summary:  Right: No evidence of deep vein thrombosis in the upper extremity. No evidence of superficial vein thrombosis in the upper extremity.  Left: No evidence of  thrombosis in the subclavian.  *See table(s) above for measurements and observations.  Diagnosing physician: Penne Colorado MD Electronically signed by Penne Colorado MD on 12/20/2024 at 10:11:33 AM.    Final    Medications:    Scheduled Medications:  amiodarone   100 mg Oral Daily   atorvastatin   40 mg Oral Daily   Chlorhexidine  Gluconate Cloth  6 each Topical Daily   dapagliflozin  propanediol  10 mg Oral Daily   pantoprazole   40 mg Oral Daily   rivaroxaban   20 mg Oral Q supper   sodium chloride  flush  10-40 mL Intracatheter Q12H   sodium chloride  flush  3 mL Intravenous Q12H   spironolactone   25 mg Oral Daily   torsemide   80 mg Oral Daily   And   torsemide   40 mg Oral Daily   traZODone   50 mg Oral QHS    Infusions:  milrinone  0.125 mcg/kg/min (12/20/24 2123)    PRN Medications: acetaminophen , cyclobenzaprine , melatonin, ondansetron  (ZOFRAN ) IV, sodium chloride  flush, sodium chloride  flush  Assessment/Plan:   1.  Acute on chronic systolic CHF with low output: Nonischemic cardiomyopathy. ?If ETOH has played a role (now drinks rarely). Echo (3/18) with EF 25-30%. Echo 7/19 and in 2/21 with EF 20-25%. Echo 6/22 with EF < 20%, normal RV.  Has St Jude CRT-D device now.  Echo-post CRT (8/23) showed EF < 20%, mild MR, RV normal.  CPX in 9/23 with moderate functional limitation due to HF and body habitus. Echo in 9/24 showed  EF <20% with severe LV dilation, normal RV size/function, no significant MR.  CPX in 10/24 showed mild to moderate HF limitation but RHC in 10/24 showed low output (1.69 thermo/2.05 Fick).  Medication titration has been limited by hypotension.  He was worked up for LVAD after RHC in 10/24 (not transplant candidate with weight and smoking), but he decided that he did not want to pursue as his symptoms were minimal.   Suspect that his panic attacks are PND.   - RHC 1/16 RA 18 PA 83/40 (54) PCWP 41 Fick 4.0/1.5 TD 4.2/1.6 PVR 3.09 WU PAPi 2.4  - Echo 1/17: EF < 20% RV moderate  to severely HK  - Coox 66 on milrinone  0.125. Stop today, repeat co-ox in 2h. - CVP down to <5, creatinine 1.94>1.65 with good response to torsemide .  - Continue torsemide  80/40 mg; may need to decrease tomorrow if creatinine trend changes. - Continue spiro 25 mg daily - Continue farxiga  10 mg daily - No Entresto, ACEI with h/o angioedema.  - Unable to tolerate losartan  due to low BP (off).   - Off Coreg  with volume overload and low output.  - Headaches with Bidil, cannot take.  - He did not tolerate digoxin .   - Insurance denied baroreceptor activation therapy.  - He remains resistant to LVAD (and with RV dysfunction may not be good candidate). His weight and social situation will make him a difficult candidate as well. VAD coordinators have seen. Discussed at [facility] today, he was turned down for LVAD. 2. Atrial fibrillation: Paroyxsmal. Emergent DCCV in 6/22. Rhythm regular on exam. < 1% burden on device check - Remains in NSR - Continue amiodarone  100 mg daily. Seeing endocrinology for hyperthyroidism, now off methimazole .  - Thyroid  labs ok. - Continue Xarelto   for now.  3. HTN: BP now on lower end, no room to titrate GDMT as above. 4. 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  Has St Jude CRT-D device now. On amiodarone .  - NSVT appears to be improving, 1 short 6 b run overnight.  - Continue amiodarone  100 mg daily. TSH ok.  5. OSA: reports full compliance w/ BiPAP.  - Continue nightly. 6. PE: 04/14/2018, diagnosed by V/Q scan. Has been on Xarelto . No bleeding issues. 7. Anxiety/Panic attacks/depression: Followed by psychiatry.  Symptoms worse since he stopped meds last summer but suspect panic attacks are more likely PND from volume overload.   8. Hyperlipidemia:   - Continue atorvastatin . 9. Obesity:   - Body mass index is 48.98 kg/m.  - He is on Mounjaro . 10. Tobacco use: Smoking about 5 cigarettes daily. Quit in the past. - Discussed need to quit  11. T2DM  - on Mounjaro   and Farxiga  - no change 12. Hyperthyroidism: Suspect related to amiodarone .  Followed by endocrinology but now off methimazole .  Thyroid  studies normal. 13. AKI on CKD stage 3: Creatinine 1.8-1.9. Down to 1.6 today - suspect cardiorenal.  Length of Stay: 5  Jordan Lee MD 12/21/2024, 7:11 AM  Advanced Heart Failure Team Pager [phone] (M-F; 7a - 4p)  Please contact [facility] for night-coverage after hours (4p -7a ) and weekends on [email or web address]  Patient seen and examined with the above-signed Advanced Practice Provider and/or Housestaff. I personally reviewed laboratory data, imaging studies and relevant notes. I independently examined the patient and formulated the important aspects of the plan. I have edited the note to reflect any of my changes or salient points. I have personally discussed the plan with the patient and/or family.  Remains on milrinone  0.125. Co-ox looks good. CVP < 5. He is eager to go home  General:  Sitting up in bed. No resp difficulty HEENT: normal Neck: supple. no JVD.  Cor: Regular rate & rhythm. No rubs, gallops or murmurs. Lungs: clear Abdomen: obese soft, nontender, nondistended.Good bowel sounds. Extremities: no cyanosis, clubbing, rash, edema Neuro: alert & orientedx3, cranial nerves grossly intact. moves all 4 extremities w/o difficulty. Affect pleasant  He has end-stage HF. Has refused VAD multiple times.   I think this is as good as we can get him. Would stop milrinone  now and if symptomatically stable can go home later today.   Will arrange outpatient f/u.   Toribio Fuel, MD  9:08 AM    "

## 2024-12-21 NOTE — Progress Notes (Signed)
 Occupational Therapy Treatment Patient Details Name: [name] MRN: [id number] DOB: [birth year] Today's Date: 12/21/2024   History of present illness Pt is a 59 y.o. M presenting to [facility] on 12/16/24 for RHC surgical intervention. PMH is significant for CHF, A-fib, CKD, hyperthyroidism, T2DM, obesity, anxiety, HLD, gout.   OT comments  Pt reported they were feeling sleepy and has tension around the scapula. Pt educated about positioning and gentle passive stretching. He reported decrease in pain and wanted to ambulate. He was able to complete ambulation with supervision while on RA 94-97%. Pt reported feeling more alert and is eager to go home. No follow up recommendation at this time but if he continues to have back pain may want to complete OP PT.       If plan is discharge home, recommend the following:  A little help with walking and/or transfers;Assistance with cooking/housework;Assist for transportation;Help with stairs or ramp for entrance   Equipment Recommendations  None recommended by OT    Recommendations for Other Services      Precautions / Restrictions Precautions Precautions: Fall Recall of Precautions/Restrictions: Intact Precaution/Restrictions Comments: PICC, 2L when sleeping Restrictions Weight Bearing Restrictions Per Provider Order: No       Mobility Bed Mobility               General bed mobility comments: in recliner    Transfers Overall transfer level: Independent                       Balance                                           ADL either performed or assessed with clinical judgement   ADL Overall ADL's : Modified independent                                       General ADL Comments: occasional cues on pacing    Extremity/Trunk Assessment Upper Extremity Assessment Upper Extremity Assessment:  (Pt reported tension pain around scapulas now cousing difficulties with BUE AROM)    Lower Extremity Assessment Lower Extremity Assessment: Defer to PT evaluation        Vision       Perception     Praxis     Communication Communication Communication: No apparent difficulties   Cognition Arousal: Alert Behavior During Therapy: WFL for tasks assessed/performed                                 Following commands: Intact        Cueing   Cueing Techniques: Verbal cues, Gestural cues  Exercises      Shoulder Instructions       General Comments Pt on RA and was at 94-97%    Pertinent Vitals/ Pain       Pain Assessment Pain Assessment: No/denies pain  Home Living                                          Prior Functioning/Environment              Frequency  Min 1X/week        Progress Toward Goals  OT Goals(current goals can now be found in the care plan section)  Progress towards OT goals: Progressing toward goals  Acute Rehab OT Goals Patient Stated Goal: to go home today OT Goal Formulation: With patient Time For Goal Achievement: 01/01/25 Potential to Achieve Goals: Good ADL Goals Additional ADL Goal #1: Pt will complete all ADLs at indep baseline Additional ADL Goal #2: Pt wil indep recall at least 3 energy conservation strategies  Plan      Co-evaluation                 AM-PAC OT 6 Clicks Daily Activity     Outcome Measure   Help from another person eating meals?: None Help from another person taking care of personal grooming?: None Help from another person toileting, which includes using toliet, bedpan, or urinal?: None Help from another person bathing (including washing, rinsing, drying)?: None Help from another person to put on and taking off regular upper body clothing?: None Help from another person to put on and taking off regular lower body clothing?: None 6 Click Score: 24    End of Session Equipment Utilized During Treatment: Gait belt  OT Visit Diagnosis:  Unsteadiness on feet (R26.81);Other abnormalities of gait and mobility (R26.89);Muscle weakness (generalized) (M62.81)   Activity Tolerance Patient tolerated treatment well   Patient Left in bed;with call bell/phone within reach   Nurse Communication Mobility status        Time: 1108-1140 OT Time Calculation (min): 32 min  Charges: OT General Charges $OT Visit: 1 Visit OT Treatments $Self Care/Home Management : 23-37 mins  Warrick POUR OTR/L  Acute Rehab Services  [phone] office number   Warrick Berber 12/21/2024, 11:46 AM

## 2024-12-21 NOTE — Discharge Summary (Addendum)
 Advanced Heart Failure Team  Discharge Summary   [name] ID: [name] MRN: [id number], DOB/AGE: [birth year] 59 y.o. Admit date: 12/16/2024 D/C date:     12/21/2024   Primary Discharge Diagnoses:  A/C HFrEF, End stage  Secondary Discharge Diagnoses:  Atrial Fibrillation HTN VT/VF OSA PE HLD Obesity Tobacco use T2DM  Hospital Course:   [name] is a 59 y.o. male with a past medical history of NICM, EF 25-30% in March 2018, felt to be related to prior ETOH abuse. He also has a history of PE 03/2014 completed a years course of Xarelto , morbid obesity, OSA, and tobacco abuse.   Admitted after RHC with Dr. Rolan 12/16/24 for elevated filling pressures and low output. He was started on milrinone  and aggressively diuresed with IV lasix  and metolazone . Once, euvolemic milrinone  was weaned down. Again the heart failure team had multiple discussion with him about LVAD consideration, as he is end-stage. He still remains resistant to VAD. He was presented at [facility], he will not undergo VAD due to [name] preference. He is stable off milrinone  and euvolemic back on home diuretics.   He was seen today and deemed appropriate for discharge by Dr. Cherrie. He has follow up scheduled in the HF Clinic.   Discharge Weight: 331 lb Discharge Vitals: Blood pressure 123/88, pulse 78, temperature 98.1 F (36.7 C), temperature source Tympanic, resp. rate 20, height 5' 9 (1.753 m), weight (!) 150.5 kg, SpO2 100%.  Labs: Lab Results  Component Value Date   WBC 8.4 12/16/2024   HGB 13.6 12/16/2024   HGB 13.6 12/16/2024   HCT 40.0 12/16/2024   HCT 40.0 12/16/2024   MCV 76.1 (L) 12/16/2024   PLT 288 12/16/2024    Recent Labs  Lab 12/16/24 1042 12/17/24 0251 12/21/24 0555  NA 137   < > 134*  K 3.7   < > 3.3*  CL 102   < > 92*  CO2 20*   < > 31  BUN 25*   < > 43*  CREATININE 1.57*   < > 1.65*  CALCIUM  9.1   < > 9.1  PROT 8.4*  --   --   BILITOT 1.2  --   --   ALKPHOS 148*  --   --   ALT 14   --   --   AST 24  --   --   GLUCOSE 156*   < > 174*   < > = values in this interval not displayed.   Lab Results  Component Value Date   CHOL 143 10/18/2024   HDL 35.50 (L) 10/18/2024   LDLCALC 86 10/18/2024   TRIG 110.0 10/18/2024   BNP (last 3 results) Recent Labs    06/30/24 1134 09/19/24 1325 11/07/24 1426  BNP 193.1* 342.3* 310.2*    ProBNP (last 3 results) Recent Labs    12/14/24 1422 12/16/24 1042  PROBNP 1,569.0* 1,982.0*     Diagnostic Studies/Procedures   VAS US  UPPER EXTREMITY VENOUS DUPLEX Result Date: 12/20/2024 UPPER VENOUS STUDY  [name] Name:  [name]  Date of Exam:   12/19/2024 Medical Rec #: [id number]       Accession #:    [id number] Date of Birth: 1966/03/14      [name] Gender: M [name] Age:   63 years Exam Location:  [facility] Procedure:      VAS US  UPPER EXTREMITY VENOUS DUPLEX Referring Phys: [name] --------------------------------------------------------------------------------  Indications: Pain Other Indications: Recent PICC line place to RUE.  Comparison Study: Previous exam on 12/23/2022 was negative for DVT. Performing Technologist: Ezzie Potters RVT, RDMS  Examination Guidelines: A complete evaluation includes B-mode imaging, spectral Doppler, color Doppler, and power Doppler as needed of all accessible portions of each vessel. Bilateral testing is considered an integral part of a complete examination. Limited examinations for reoccurring indications may be performed as noted.  Right Findings: +----------+------------+---------+-----------+----------+-------+ RIGHT     CompressiblePhasicitySpontaneousPropertiesSummary +----------+------------+---------+-----------+----------+-------+ IJV           Full       Yes       Yes                      +----------+------------+---------+-----------+----------+-------+ Subclavian    Full       Yes       Yes                       +----------+------------+---------+-----------+----------+-------+ Axillary      Full       Yes       Yes                      +----------+------------+---------+-----------+----------+-------+ Brachial      Full       Yes       Yes                      +----------+------------+---------+-----------+----------+-------+ Radial        Full                                          +----------+------------+---------+-----------+----------+-------+ Ulnar         Full                                          +----------+------------+---------+-----------+----------+-------+ Cephalic      Full                                          +----------+------------+---------+-----------+----------+-------+ Basilic       Full       Yes       Yes                      +----------+------------+---------+-----------+----------+-------+ Limited visualization of basilic and brachial veins due to PICC line placement. Visualized areas are compressible with color and phasic flow.  Left Findings: +----------+------------+---------+-----------+----------+--------------------+ LEFT      CompressiblePhasicitySpontaneousProperties      Summary        +----------+------------+---------+-----------+----------+--------------------+ Subclavian               Yes       Yes                   patent by                                                              color/doppler     +----------+------------+---------+-----------+----------+--------------------+  Summary:  Right: No evidence of deep vein thrombosis in the upper extremity. No evidence of superficial vein thrombosis in the upper extremity.  Left: No evidence of thrombosis in the subclavian.  *See table(s) above for measurements and observations.  Diagnosing physician: Penne Colorado MD Electronically signed by Penne Colorado MD on 12/20/2024 at 10:11:33 AM.    Final     Discharge Medications   Allergies as of 12/21/2024        Reactions   Ace Inhibitors Anaphylaxis, Swelling   Angioedema   Isosorbide  Anaphylaxis   Buspirone Other (See Comments)   dizziness   Doxycycline  Other (See Comments)   Real bad acid reflux/heartburn   Bidil [isosorb Dinitrate-hydralazine ] Other (See Comments)   headache   Digoxin     Unspecified side effects Thinks this had to do with his heart stopping    Digoxin  And Related    Unspecified side effects   Isosorbide  Dinitrate    Unsure of reaction        Medication List     STOP taking these medications    carvedilol  6.25 MG tablet Commonly known as: COREG    doxycycline  100 MG capsule Commonly known as: VIBRAMYCIN    metolazone  2.5 MG tablet Commonly known as: ZAROXOLYN    naproxen  sodium 220 MG tablet Commonly known as: ALEVE    sildenafil  50 MG tablet Commonly known as: VIAGRA    traMADol  100 MG 24 hr tablet Commonly known as: ULTRAM -ER   traZODone  100 MG tablet Commonly known as: DESYREL    traZODone  50 MG tablet Commonly known as: DESYREL        TAKE these medications    acetaminophen  325 MG tablet Commonly known as: TYLENOL  Take 2 tablets (650 mg total) by mouth every 4 (four) hours as needed for headache or mild pain (pain score 1-3).   albuterol  108 (90 Base) MCG/ACT inhaler Commonly known as: VENTOLIN  HFA INHALE 1-2 PUFFS INTO THE LUNGS EVERY 4 (FOUR) HOURS AS NEEDED FOR SHORTNESS OF BREATH OR WHEEZING.   amiodarone  200 MG tablet Commonly known as: PACERONE  Take 0.5 tablets (100 mg total) by mouth daily. PLEASE SCHEDULE FOLLOW UP FOR MORE RERFILLS   atorvastatin  40 MG tablet Commonly known as: LIPITOR TAKE 1 TABLET BY MOUTH EVERY DAY   benzonatate  100 MG capsule Commonly known as: TESSALON  Take 1 capsule (100 mg total) by mouth every 8 (eight) hours.   cyclobenzaprine  10 MG tablet Commonly known as: FLEXERIL  Take 1 tablet (10 mg total) by mouth 2 (two) times daily as needed for muscle spasms.   dapagliflozin  propanediol 10 MG Tabs  tablet Commonly known as: Farxiga  Take 1 tablet (10 mg total) by mouth daily.   DayVigo  5 MG Tabs Generic drug: Lemborexant  Take 1 tablet (5 mg total) by mouth at bedtime as needed (insomnia).   DOXEPIN  HCL PO Take 6 mg by mouth at bedtime as needed (sleep).   gabapentin  300 MG capsule Commonly known as: NEURONTIN  Take 300 mg by mouth 3 (three) times daily.   Mounjaro  2.5 MG/0.5ML Pen Generic drug: tirzepatide  Inject 2.5 mg into the skin once a week.   MULTIVITAMIN ADULTS 50+ PO Take 1 tablet by mouth daily.   pantoprazole  40 MG tablet Commonly known as: PROTONIX  TAKE 1 TABLET (40 MG TOTAL) BY MOUTH DAILY. SCHEDULE AN APPT FOR FURTHER REFILLS   potassium chloride  SA 20 MEQ tablet Commonly known as: KLOR-CON  M Take 2 tablets (40 mEq total) by mouth daily. What changed: how much to take   PRESCRIPTION MEDICATION Inhale into the  lungs See admin instructions. Bipap, pressure 16/12 with 2L of O2 - use whenever sleeping   spironolactone  25 MG tablet Commonly known as: ALDACTONE  TAKE 1 TABLET BY MOUTH EVERYDAY AT BEDTIME What changed: See the new instructions.   torsemide  20 MG tablet Commonly known as: DEMADEX  Take 80 mg (4 tablets) every morning and 40 mg (2 tablets) every evening What changed:  how much to take how to take this when to take this additional instructions   Xarelto  20 MG Tabs tablet Generic drug: rivaroxaban  Take 1 tablet (20 mg total) by mouth daily with supper.        Disposition   The [name] will be discharged in stable condition to home.   Follow-up Information     [name], MD Follow up on 12/29/2024.   Specialty: Internal Medicine Why: 11:00 am for hospital follow up Contact information: [address] [place] [address] [phone]         [facility] Follow up on 12/30/2024.   Specialty: Cardiology Why: at 11:00 am [facility], Entrance C Free Valet Parking  Available Contact information: [address] [place]  [address] [phone]                  Duration of Discharge Encounter: 15 minutes   Jordan Lee, NP 12/21/2024, 1:49 PM ' [name] seen and examined with the above-signed Advanced Practice Provider and/or Housestaff. I personally reviewed laboratory data, imaging studies and relevant notes. I independently examined the [name] and formulated the important aspects of the plan. I have edited the note to reflect any of my changes or salient points. I have personally discussed the plan with the [name] and/or family.   Remains on milrinone  0.125. Co-ox looks good. CVP < 5. He is eager to go home   General:  Sitting up in bed. No resp difficulty HEENT: normal Neck: supple. no JVD.  Cor: Regular rate & rhythm. No rubs, gallops or murmurs. Lungs: clear Abdomen: obese soft, nontender, nondistended.Good bowel sounds. Extremities: no cyanosis, clubbing, rash, edema Neuro: alert & orientedx3, cranial nerves grossly intact. moves all 4 extremities w/o difficulty. Affect pleasant   He has end-stage HF. Has refused VAD multiple times.   Case discussed in detail at [facility] and decided that he is not an LVAD candidate.    I think this is as good as we can get him. Would stop milrinone  now and if symptomatically stable can go home later today.   Will arrange outpatient f/u.   MD d//c time 38 mins   Toribio Fuel, MD  9:08 AM

## 2024-12-21 NOTE — Progress Notes (Signed)
 Orthopedic Tech Progress Note Patient Details:  [name] May 27, 1966 [id number]  Patient stated RN had just taken them off and he was going home today   Patient ID: [name], male   DOB: 05/01/1966, 59 y.o.   MRN: [id number]  [name] 12/21/2024, 11:15 AM

## 2024-12-21 NOTE — Progress Notes (Signed)
 Mobility Specialist Progress Note:    12/21/24 1152  Mobility  Activity Ambulated with assistance;Stood with assistance (Standing Marches, Mini Squats)  Level of Assistance Standby assist, set-up cues, supervision of patient - no hands on  Assistive Device None  Distance Ambulated (ft) 15 ft  Range of Motion/Exercises Active  Activity Response Tolerated well  Mobility Referral Yes  Mobility visit 1 Mobility  Mobility Specialist Start Time (ACUTE ONLY) 1152  Mobility Specialist Stop Time (ACUTE ONLY) 1201  Mobility Specialist Time Calculation (min) (ACUTE ONLY) 9 min   Received pt sitting in recliner. Previously seen by OT but agreeable to in room session. Pt no c/o any symptoms. Pt able to move, ambulate, and perform exercises well. Pt returned back to recliner for lunch w/ all needs met.   Venetia Keel Mobility Specialist Please Neurosurgeon or Rehab Office at [phone]

## 2024-12-21 NOTE — TOC Transition Note (Signed)
 Transition of Care [facility]) - Discharge Note   Patient Details  Name: [name] MRN: [id number] Date of Birth: August 16, 1966  [facility] CM/SW Contact:  Waddell Barnie Rama, RN Phone Number: 12/21/2024, 2:45 PM   Clinical Narrative:    For dc today, per PA, they will set up sleep study outpatient.  Patient has transportation at dc.          Patient Goals and CMS Choice            Discharge Placement                       Discharge Plan and Services Additional resources added to the After Visit Summary for                                       Social Drivers of Health (SDOH) Interventions SDOH Screenings   Food Insecurity: No Food Insecurity (12/16/2024)  Housing: Low Risk (12/16/2024)  Transportation Needs: No Transportation Needs (12/16/2024)  Utilities: Not At Risk (12/16/2024)  Alcohol Screen: Low Risk (09/23/2024)  Depression (PHQ2-9): High Risk (09/23/2024)  Financial Resource Strain: Low Risk (09/23/2024)  Physical Activity: Inactive (09/23/2024)  Social Connections: Moderately Isolated (12/16/2024)  Stress: Stress Concern Present (09/23/2024)  Tobacco Use: High Risk (12/16/2024)  Health Literacy: Adequate Health Literacy (09/23/2024)     Readmission Risk Interventions    12/19/2024    4:27 PM  Readmission Risk Prevention Plan  Transportation Screening Complete  PCP or Specialist Appt within 5-7 Days Complete  Home Care Screening Complete  Medication Review (RN CM) Complete

## 2024-12-22 ENCOUNTER — Telehealth: Payer: Self-pay | Admitting: *Deleted

## 2024-12-22 NOTE — Transitions of Care (Post Inpatient/ED Visit) (Signed)
 "  12/22/2024  Name: [name] MRN: [id number] DOB: [birth year]  Today's TOC FU Call Status: Today's TOC FU Call Status:: Successful TOC FU Call Completed TOC FU Call Complete Date: 12/22/24  Patient's Name and Date of Birth confirmed. Name, DOB  Transition Care Management Follow-up Telephone Call Date of Discharge: 12/21/24 Discharge Facility: [facility]) Type of Discharge: Inpatient Admission Primary Inpatient Discharge Diagnosis:: CHF exacerbation post- scheduled- planned cardiac catherization How have you been since you were released from the hospital?: Same (I am in a lot of discomfort and this call is really stressing me out, I know to call the doctors and I will get around to it when I have time. Patient abruptly ended call by hanging up without allowing further input from [facility] RN CM) Any questions or concerns?: Yes Patient Questions/Concerns:: I don't understand why they changed some of these medications, they didn't really tell me anything about it when I was at the hospital- I just saw it when I started reading these discharge papers; I have not had time to call the heart doctor right now, I am too uncomfortable, I will get around to it when I have time Patient Questions/Concerns Addressed: Other: (Attempted to review medication changes/ details with patient however he reports he is in discomfort from chronic back pain and is not up to completing TOC assessment- patient ended up abruptly hanging up phone mid way through [facility] assessment)  Items Reviewed: Did you receive and understand the discharge instructions provided?: Yes (briefly reviewed with patient who verbalizes fair understanding of same) Medications obtained,verified, and reconciled?: No (Declined all aspects of medication reconciliation/ review, states in too much discomfort to complete TOC call; patient abruptly ended call by hanging up) Medications Not Reviewed Reasons:: Other: (unable to determine/ assess:  patient declined full TOC call: declined all aspects of detailed medication review) Any new allergies since your discharge?: No Dietary orders reviewed?: No (unable to determine/ assess: patient declined full TOC call) Do you have support at home?: Yes People in Home [RPT]: child(ren), dependent, other relative(s) Name of Support/Comfort Primary Source: Reports essentially independent in self-care activities; resides with 55 y/o son and ex-spouse: assists as/ if needed/ indicated if they are available  Medications Reviewed Today: Medications Reviewed Today     Reviewed by Zyliah Schier M, RN (Registered Nurse) on 12/22/24 at 1536  Med List Status: <None>   Medication Order Taking? Sig Documenting Provider Last Dose Status Informant  acetaminophen  (TYLENOL ) 325 MG tablet [id number]  Take 2 tablets (650 mg total) by mouth every 4 (four) hours as needed for headache or mild pain (pain score 1-3). Lee, Jordan, NP  Active   albuterol  (VENTOLIN  HFA) 108 [phone] Base) MCG/ACT inhaler [id number] No INHALE 1-2 PUFFS INTO THE LUNGS EVERY 4 (FOUR) HOURS AS NEEDED FOR SHORTNESS OF BREATH OR WHEEZING.  Patient not taking: No sig reported   Rolan Ezra RAMAN, MD Not Taking Active Self, Pharmacy Records  amiodarone  (PACERONE ) 200 MG tablet [id number]  Take 0.5 tablets (100 mg total) by mouth daily. PLEASE SCHEDULE FOLLOW UP FOR MORE RERFILLS Lee, Jordan, NP  Active   atorvastatin  (LIPITOR) 40 MG tablet [id number] No TAKE 1 TABLET BY MOUTH EVERY DAY Glena Harlene HERO, [place] 12/15/2024 Active Self, Pharmacy Records  benzonatate  (TESSALON ) 100 MG capsule [id number] No Take 1 capsule (100 mg total) by mouth every 8 (eight) hours.  Patient not taking: No sig reported   Rogelia Jerilynn RAMAN, MD Not Taking Active Self, Pharmacy Records  cyclobenzaprine  (  FLEXERIL ) 10 MG tablet [id number] No Take 1 tablet (10 mg total) by mouth 2 (two) times daily as needed for muscle spasms. Rolan Ezra RAMAN, MD 12/15/2024 Active Self, Pharmacy  Records  dapagliflozin  propanediol (FARXIGA ) 10 MG TABS tablet [id number]  Take 1 tablet (10 mg total) by mouth daily. Lee, Jordan, NP  Active   DOXEPIN  HCL PO [id number] No Take 6 mg by mouth at bedtime as needed (sleep). [provider] 12/15/2024 Active Self, Pharmacy Records  gabapentin  (NEURONTIN ) 300 MG capsule [id number] No Take 300 mg by mouth 3 (three) times daily.  Patient not taking: No sig reported   [provider] Not Taking Active Self, Pharmacy Records  Lemborexant  (DAYVIGO ) 5 MG TABS [id number] No Take 1 tablet (5 mg total) by mouth at bedtime as needed (insomnia).  Patient not taking: Reported on 12/15/2024   Vickey Mettle, MD Not Taking Active Self, Pharmacy Records  Multiple Vitamins-Minerals (MULTIVITAMIN ADULTS 50+ PO) [id number] No Take 1 tablet by mouth daily. [provider] 12/15/2024 Active Self, Pharmacy Records  pantoprazole  (PROTONIX ) 40 MG tablet [id number] No TAKE 1 TABLET (40 MG TOTAL) BY MOUTH DAILY. SCHEDULE AN APPT FOR FURTHER REFILLS Joshua Debby CROME, MD 12/15/2024 Active Self, Pharmacy Records  potassium chloride  SA (KLOR-CON  M) 20 MEQ tablet [id number]  Take 2 tablets (40 mEq total) by mouth daily. Lee, Jordan, NP  Active   Discontinued 02/20/12 1023 (Patient has not taken in last 30 days)   PRESCRIPTION MEDICATION [id number]  Inhale into the lungs See admin instructions. Bipap, pressure 16/12 with 2L of O2 - use whenever sleeping [provider]  Active Self, Pharmacy Records  spironolactone  (ALDACTONE ) 25 MG tablet [id number]  TAKE 1 TABLET BY MOUTH EVERYDAY AT BEDTIME  Patient taking differently: Take 25 mg by mouth daily.   Colletta Manuelita Garre, PA-C  Active Self, Pharmacy Records  tirzepatide  (MOUNJARO ) 2.5 MG/0.5ML Pen [id number] No Inject 2.5 mg into the skin once a week. Joshua Debby CROME, MD Past Month Active Self, Pharmacy Records  torsemide  (DEMADEX ) 20 MG tablet [id number]  Take 80 mg (4 tablets) every morning and 40 mg (2  tablets) every evening Lee, Jordan, NP  Active   XARELTO  20 MG TABS tablet [id number] No Take 1 tablet (20 mg total) by mouth daily with supper. Joshua Debby CROME, MD 12/15/2024  8:00 AM Active Self, Pharmacy Records  Med List Note Emiliano Lash, CPhT 12/15/24 1211): Patient on a Bipap           Home Care and Equipment/Supplies: Were Home Health Services Ordered?: No Any new equipment or medical supplies ordered?: No  Functional Questionnaire: Do you need assistance with bathing/showering or dressing?:  (unable to determine/ assess: patient declined full TOC call) Do you need assistance with meal preparation?:  (unable to determine/ assess: patient declined full TOC call) Do you need assistance with eating?:  (unable to determine/ assess: patient declined full TOC call) Do you have difficulty maintaining continence: No Do you need assistance with getting out of bed/getting out of a chair/moving?: No Do you have difficulty managing or taking your medications?:  (unable to determine/ assess: patient declined full TOC call and medication questions)  Follow up appointments reviewed: PCP Follow-up appointment confirmed?: Yes Date of PCP follow-up appointment?: 12/29/24 Follow-up Provider: PCP- covering provider Specialist Hospital Follow-up appointment confirmed?: Yes Date of Specialist follow-up appointment?: 12/30/24 Follow-Up Specialty Provider:: cardiology provider Do you need transportation to your follow-up appointment?: No Do you understand care options if your condition(s) worsen?: Yes-patient  verbalized understanding  SDOH Interventions Today    Flowsheet Row Most Recent Value  SDOH Interventions   Food Insecurity Interventions Patient Declined  [Declined full TOC call/ assessments]  Housing Interventions Patient Declined  [Declined full TOC call/ assessments]  Transportation Interventions Intervention Not Indicated  [Reports drives self at baseline,  family- friends assists as- if  needed- indicated]  Utilities Interventions Patient Declined  [Declined full TOC call/ assessments]   See TOC assessment tabs for additional assessment/ TOC intervention information  Patient confrontational and obviously irritated/ annoyed by TOC call: reports he is having chronic back discomfort and he abruptly ended call by hanging up without allowing further input from [facility] RN CM- was unable to offer alternate time to contact patient or TOC 30-day program enrollment  Pls call/ message for questions,  Beatris Blinda Lawrence, RN, BSN, Media Planner  Transitions of Care  [facility] [phone]: direct office  "
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
## 2024-12-27 ENCOUNTER — Encounter: Payer: Self-pay | Admitting: Internal Medicine

## 2024-12-27 ENCOUNTER — Ambulatory Visit: Attending: Cardiology

## 2024-12-27 DIAGNOSIS — Z95810 Presence of automatic (implantable) cardiac defibrillator: Secondary | ICD-10-CM | POA: Diagnosis not present

## 2024-12-27 DIAGNOSIS — I5022 Chronic systolic (congestive) heart failure: Secondary | ICD-10-CM

## 2024-12-27 NOTE — Progress Notes (Signed)
" °  Received: Today [place], Harlene HERO, FNP  [place], Mitzie RAMAN, RN Recommend he get back on torsemide  80/40, will re-evaluate at follow up . Thanks "

## 2024-12-27 NOTE — Progress Notes (Unsigned)
 "     Subjective:    Patient ID: [name], male    DOB: [birth year], 59 y.o.   MRN: [id number]     HPI [name] is here for follow up from the hospital.  Admitted 1/16-1/21  He is a 59 year old male with nonischemic cardiomyopathy with EF 25-30% (2018) likely related to prior alcohol abuse.,  History of PE s/p treatment with Xarelto , OSA, obesity and tobacco abuse.  Was seen recently in clinic and was doing poorly.  He had stopped his psych meds several months ago, separated from his wife and was having increased anxiety.  He was having shortness of breath at night that sounded like PND.  He was missing his medications frequently, taking Coreg  only once daily, suffering from chronic neuropathic pain, had profound orthopnea and was sitting up while sleeping.  He was short of breath walking short distances and had increased peripheral edema.  His weight was up 16 pounds.  His torsemide  was increased to 80 mg daily and he was started on metolazone , which his pharmacy did not have any did not start.  He had decreased urination.  He was in the hospital for RHC, which showed elevated filling pressures and low output.  He was admitted for acute on chronic systolic heart failure with low output.  Acute on chronic systolic heart failure: Started on milrinone  0.25 mcg/kg/min PICC placed, follow CVP, Co. ox Given Lasix  80 mg IV x 1 then started on a drip 15 mg per hour Metolazone  given Coreg  stopped, continued spironolactone , Farxiga  Once euvolemic milrinone  discontinued Considered end-stage-discussed LVAD, but he remained resistant Discharged on home diuretics-spironolactone  25 mg daily, torsemide  80 mg in the morning 40 mg in the evening   Atrial fibrillation, paroxysmal: Continue amiodarone  100 mg daily, Xarelto   Hypertension: Only on spironolactone  25 mg daily and torsemide   OSA- BiPAP nightly  Hyperlipidemia Continue atorvastatin  40 mg daily  Obesity: Weight was up 16  pounds-likely related to volume overload Continue Mounjaro   Type 2 diabetes: On Mounjaro , Farxiga   Hypothyroidism: Suspected related to amiodarone  Following with endocrine  CKD, stage III Creatinine back at baseline   Hosp scale - 331 - last Wednesday Home scale - last Friday - 341 Home scale - today 343    Discussed the use of AI scribe software for clinical note transcription with the patient, who gave verbal consent to proceed.  History of Present Illness [name] is a 59 year old male with heart failure who presents with worsening joint pain and fluid overload.  He was recently discharged from the hospital where he was treated for fluid overload related to heart failure. His weight increased significantly, reaching 341 pounds last Friday, and has fluctuated since then. He is currently taking torsemide  80 mg in the morning but has been unable to take the additional 40 mg dose in the afternoon due to weakness. He uses urinals as he is unable to reach the bathroom due to weakness and pain. No fever or chest pain. The patient does not feel heart palpitations. Occasional coughing, particularly with exertion, and increased leg swelling are noted.  He experiences severe joint pain, particularly in his shoulders, wrists, and knees, described as 'burning' and 'hitting me everywhere.' The pain is bilateral and has significantly impacted his mobility, making it difficult to perform daily activities such as dressing and using the bathroom. He has tried tramadol  and Tylenol  for pain relief without success. Prednisone  provided temporary relief in the past.  He has a history  of anxiety and was previously on clonazepam , which he discontinued in 2024. He experiences panic attacks and difficulty sleeping, waking up hourly and feeling out of breath. He has tried gabapentin  and doxepin  for sleep and anxiety without improvement. He also has a history of using a BiPAP machine for sleep apnea, with  recent issues regarding oxygen  levels dropping during sleep.  He missed his sleep study while in the hospital and is trying to reschedule it.   He reports a history of constipation, which he managed with laxatives. He notes occasional epistaxis, which he attributes to Xarelto  use.    Medications and allergies reviewed with patient and updated if appropriate.  Medications Ordered Prior to Encounter[1]   Review of Systems  Constitutional:  Negative for fever.  Respiratory:  Positive for cough (with exertion) and shortness of breath. Negative for wheezing.   Cardiovascular:  Positive for leg swelling (worse since discharge). Negative for chest pain and palpitations.  Gastrointestinal:  Positive for constipation. Negative for abdominal pain.  Musculoskeletal:  Positive for arthralgias.  Neurological:  Negative for light-headedness and headaches.       Objective:   Vitals:   12/29/24 1051  BP: 112/78  Pulse: 90  Temp: 98.2 F (36.8 C)  SpO2: 98%   BP Readings from Last 3 Encounters:  12/29/24 112/78  12/21/24 123/88  12/14/24 94/60   Wt Readings from Last 3 Encounters:  12/29/24 (!) 347 lb (157.4 kg)  12/21/24 (!) 331 lb 11.2 oz (150.5 kg)  12/14/24 (!) 361 lb 9.6 oz (164 kg)   Body mass index is 51.24 kg/m.    Physical Exam Constitutional:      General: He is not in acute distress.    Appearance: Normal appearance. He is not ill-appearing.  HENT:     Head: Normocephalic and atraumatic.  Eyes:     Conjunctiva/sclera: Conjunctivae normal.  Cardiovascular:     Rate and Rhythm: Normal rate and regular rhythm.     Heart sounds: Normal heart sounds.  Pulmonary:     Effort: Pulmonary effort is normal. No respiratory distress.     Breath sounds: Normal breath sounds. No wheezing or rales.  Musculoskeletal:        General: Swelling (mild swelling) and tenderness (with palpation of several finger joints and wrists) present. No deformity.     Right lower leg: Edema  (minimal pitting) present.     Left lower leg: Edema (minimal pitting) present.     Comments: Decreased ROM of finger, wrists and shoulders due to pain  Skin:    General: Skin is warm and dry.     Findings: No rash.  Neurological:     Mental Status: He is alert. Mental status is at baseline.  Psychiatric:        Mood and Affect: Mood normal.        Lab Results  Component Value Date   WBC 8.4 12/16/2024   HGB 13.6 12/16/2024   HGB 13.6 12/16/2024   HCT 40.0 12/16/2024   HCT 40.0 12/16/2024   PLT 288 12/16/2024   GLUCOSE 174 (H) 12/21/2024   CHOL 143 10/18/2024   TRIG 110.0 10/18/2024   HDL 35.50 (L) 10/18/2024   LDLDIRECT 114.0 03/21/2020   LDLCALC 86 10/18/2024   ALT 14 12/16/2024   AST 24 12/16/2024   NA 134 (L) 12/21/2024   K 3.3 (L) 12/21/2024   CL 92 (L) 12/21/2024   CREATININE 1.65 (H) 12/21/2024   BUN 43 (H) 12/21/2024  CO2 31 12/21/2024   TSH 4.250 12/20/2024   PSA 0.71 10/18/2024   INR 1.5 (H) 09/29/2023   HGBA1C 6.6 (H) 10/18/2024   MICROALBUR 0.5 10/07/2024   VAS US  UPPER EXTREMITY VENOUS DUPLEX UPPER VENOUS STUDY    Patient Name:  [name]  Date of Exam:   12/19/2024 Medical Rec #: [id number]       Accession #:    [id number] Date of Birth: September 23, 1966      Patient Gender: M Patient Age:   64 years Exam Location:  [facility] Procedure:      VAS US  UPPER EXTREMITY VENOUS DUPLEX Referring Phys: MANUELITA [facility]  --------------------------------------------------------------------------------   Indications: Pain Other Indications: Recent PICC line place to RUE. Comparison Study: Previous exam on 12/23/2022 was negative for DVT.  Performing Technologist: Ezzie Potters RVT, RDMS    Examination Guidelines: A complete evaluation includes B-mode imaging, spectral Doppler, color Doppler, and power Doppler as needed of all accessible portions of each vessel. Bilateral testing is considered an integral part of a complete examination. Limited  examinations for reoccurring indications may be performed as noted.    Right Findings: +----------+------------+---------+-----------+----------+-------+ RIGHT     CompressiblePhasicitySpontaneousPropertiesSummary +----------+------------+---------+-----------+----------+-------+ IJV           Full       Yes       Yes                      +----------+------------+---------+-----------+----------+-------+ Subclavian    Full       Yes       Yes                      +----------+------------+---------+-----------+----------+-------+ Axillary      Full       Yes       Yes                      +----------+------------+---------+-----------+----------+-------+ Brachial      Full       Yes       Yes                      +----------+------------+---------+-----------+----------+-------+ Radial        Full                                          +----------+------------+---------+-----------+----------+-------+ Ulnar         Full                                          +----------+------------+---------+-----------+----------+-------+ Cephalic      Full                                          +----------+------------+---------+-----------+----------+-------+ Basilic       Full       Yes       Yes                      +----------+------------+---------+-----------+----------+-------+  Limited visualization of basilic and brachial veins due to PICC line placement. Visualized areas are compressible with color and phasic flow.  Left Findings: +----------+------------+---------+-----------+----------+--------------------+ LEFT      CompressiblePhasicitySpontaneousProperties      Summary        +----------+------------+---------+-----------+----------+--------------------+ Subclavian               Yes       Yes                   patent by                                                              color/doppler      +----------+------------+---------+-----------+----------+--------------------+    Summary:   Right: No evidence of deep vein thrombosis in the upper extremity. No evidence of superficial vein thrombosis in the upper extremity.   Left: No evidence of thrombosis in the subclavian.    *See table(s) above for measurements and observations.   Diagnosing physician: Penne Colorado MD Electronically signed by Penne Colorado MD on 12/20/2024 at 10:11:33 AM.      Final      Assessment & Plan:    See Problem List for Assessment and Plan of chronic medical problems.   Assessment and Plan Assessment & Plan Polyarthritis with chronic joint pain Chronic joint pain in hands, wrists, shoulders, and knees-severe in nature. Previous treatments ineffective including tramadol  and Tylenol . Prednisone  not suitable due to kidney concerns. Significant distress over pain and quality of life impact. - Referred to sports medicine for joint evaluation and x-rays. - Ordered blood work to rule out autoimmune disease, gout. - Prescribed 5-day supply of hydrocodone - acet 5-325 mg for pain management. - Referred to pain management for further evaluation and treatment options.  Obstructive sleep apnea Difficulty sleeping with frequent awakenings and gasping. Recent sleep study missed due to hospitalization. Current BiPAP settings not tolerated. Significant sleep disturbances and daytime fatigue reported.  Was on oxygen  in the past but not on it currently at home and denies hospitalization oxygen  level did drop at nighttime. - Reschedule sleep study to assess current sleep apnea status and need for oxygen  therapy.  Chronic kidney disease stage 3b Stage 3 chronic kidney disease likely exacerbated by diuretic use and fluid overload. Recent hospitalization involved diuretic adjustments. Aware of need to monitor kidney function closely. - Continue Torsemide  80 mg AM   Acute on chronic systolic heart  failure Echocardiogram 12/16/2024 with EF less than 20%, grade 3 DD Currently taking torsemide  80 mg daily in the morning-not able to take the 40 mg in the afternoon due to weakness, chronic pain Has noticed fluid reaccumulation since discharge Sees cardiology tomorrow  Hypertension, hyperlipidemia Chronic Controlled Continue atorvastatin  40 mg daily, spironolactone  25 mg daily, torsemide  80 mg daily  Paroxysmal atrial fibrillation Chronic Continue Xarelto  20 mg daily, amiodarone  100 mg daily Has follow-up with cardiology tomorrow    [1]  Current Outpatient Medications on File Prior to Visit  Medication Sig Dispense Refill   acetaminophen  (TYLENOL ) 325 MG tablet Take 2 tablets (650 mg total) by mouth every 4 (four) hours as needed for headache or mild pain (pain score 1-3).     albuterol  (VENTOLIN  HFA) 108 (90 Base) MCG/ACT inhaler INHALE 1-2 PUFFS INTO THE LUNGS EVERY 4 (FOUR) HOURS AS NEEDED FOR SHORTNESS OF BREATH OR WHEEZING. 8.5 each 1   atorvastatin  (LIPITOR) 40 MG tablet TAKE  1 TABLET BY MOUTH EVERY DAY 90 tablet 3   cyclobenzaprine  (FLEXERIL ) 10 MG tablet Take 1 tablet (10 mg total) by mouth 2 (two) times daily as needed for muscle spasms. 10 tablet 0   dapagliflozin  propanediol (FARXIGA ) 10 MG TABS tablet Take 1 tablet (10 mg total) by mouth daily. 90 tablet 3   DOXEPIN  HCL PO Take 6 mg by mouth at bedtime as needed (sleep).     Multiple Vitamins-Minerals (MULTIVITAMIN ADULTS 50+ PO) Take 1 tablet by mouth daily.     pantoprazole  (PROTONIX ) 40 MG tablet TAKE 1 TABLET (40 MG TOTAL) BY MOUTH DAILY. SCHEDULE AN APPT FOR FURTHER REFILLS 90 tablet 1   potassium chloride  SA (KLOR-CON  M) 20 MEQ tablet Take 2 tablets (40 mEq total) by mouth daily. 180 tablet 3   PRESCRIPTION MEDICATION Inhale into the lungs See admin instructions. Bipap, pressure 16/12 with 2L of O2 - use whenever sleeping     spironolactone  (ALDACTONE ) 25 MG tablet TAKE 1 TABLET BY MOUTH EVERYDAY AT BEDTIME 90 tablet 3    tirzepatide  (MOUNJARO ) 2.5 MG/0.5ML Pen Inject 2.5 mg into the skin once a week. 2 mL 0   torsemide  (DEMADEX ) 20 MG tablet Take 80 mg (4 tablets) every morning and 40 mg (2 tablets) every evening 200 tablet 6   XARELTO  20 MG TABS tablet Take 1 tablet (20 mg total) by mouth daily with supper. 90 tablet 1   amiodarone  (PACERONE ) 200 MG tablet Take 0.5 tablets (100 mg total) by mouth daily. PLEASE SCHEDULE FOLLOW UP FOR MORE RERFILLS (Patient not taking: Reported on 12/29/2024) 15 tablet 6   [DISCONTINUED] pravastatin  (PRAVACHOL ) 40 MG tablet Take 40 mg by mouth daily.     No current facility-administered medications on file prior to visit.   "

## 2024-12-27 NOTE — Progress Notes (Signed)
 EPIC Encounter for ICM Monitoring  Patient Name: [name] is a 59 y.o. male Date: 12/27/2024 Primary Care Physican: Joshua Debby LITTIE, MD Primary Cardiologist: Rolan Electrophysiologist: Kennyth Pore Pacing: 96% 02/04/2023 Weight:  358 lbs       06/01/2023 Weight: 328 lbs (losing weight taking Mounjaro )   06/09/2023 Weight: 328 lbs             07/07/2023 Weight: 308 lbs   09/16/2023 Weight: 317 lbs    02/22/2024 Office Weight: 330 lbs 03/30/2024 Weight: 332 lbs (was up to 336 lbs) 04/01/2024 Weight:  331 lbs 07/04/2024 Office Weight: 344 lbs 09/20/2024 Office Weight: 349 lbs 10/07/2024 Weight: 345 lbs 12/21/2024 Discharge Weight: 331 lbs 12/27/2024 Weight: 346 lbs     Spoke with patient and heart failure questions reviewed.  Transmission results reviewed.  Pt has not been taking Torsemide  because he felt like it was too much and makes him urinate a lot.  He has gained about 8-10 lbs since hospital discharge.  Hospitalized 12/16/2024-12/21/2024 (correlates with possible dryness)   Diet:  12/27/2024 Pt reports he has cut back on fluid intake but not aware to limit salt intake.    Since 11/22/2024 ICM Remote Transmission:  CorVue thoracic impedance suggesting possible fluid accumulation starting 12/24/2024.  Suggesting possible dryness from 12/16/2024-12/22/2024.  [name]   Prescribed:  Torsemide  20 mg Take 4 tablet(s) (80 mg total) every morning and 2 tablets (40 mg total) every evening.   Potassium 20 mEq take 2 tablet(s) (40 mEq total) by mouth daily.   Spironolactone  25 mg take 1 tablet daily   Labs: 12/21/2024 Creatinine 1.65, BUN 43, Potassium 3.3, Sodium 134, GFR 48  12/20/2024 Creatinine 1.94, BUN 40, Potassium 4.3, Sodium 134, GFR 40  12/19/2024 Creatinine 1.82, BUN 35, Potassium 3.8, Sodium 129, GFR 43  12/18/2024 Creatinine 1.91, BUN 33, Potassium 3.4, Sodium 134, GFR 41  12/17/2024 Creatinine 1.80, BUN 30, Potassium 4.2, Sodium 136, GFR 44  12/16/2024 Creatinine 1.57, BUN 25, Potassium 3.7,  Sodium 137, GFR 51  A complete set of results can be found in Results Review.   Recommendations:  Discussed limiting salt intake and taking Torsemide  as prescribed.  Also explained importance of taking Potassium and all his meds.  He confirmed he will be at 12/30/2024 HF clinic visit.    Follow-up plan: ICM clinic 31 day follow up currently on hold but 91 day remote monitoring will continue.   Next 31 day ICM remote transmission due 01/27/2025.   91 day device clinic remote transmission 01/25/2024.     EP/Cardiology Office Visits:  12/30/2024 with HF Clinic.   Recall 09/15/2025 with Charlies Arthur, PA.   Copy of ICM check sent to Dr. Kennyth.  Sent to Harlene Gainer, NP for review and recommendations if needed.  Remote monitoring is medically necessary for Heart Failure Management.    Daily Thoracic Impedance ICM trend: 09/28/2024 through 12/27/2024.    12-14 Month Thoracic Impedance ICM trend:     [name], RN 12/27/2024 10:40 AM

## 2024-12-27 NOTE — Progress Notes (Signed)
 [name]                                          MRN: [id number]   12/27/2024   The [facility]BCI Quality Team Specialist reviewed this patient medical record for the purposes of chart review for care gap closure. The following were reviewed: chart review for care gap closure-kidney health evaluation for diabetes:eGFR  and uACR.    VBCI Quality Team

## 2024-12-28 ENCOUNTER — Telehealth: Payer: Self-pay

## 2024-12-28 DIAGNOSIS — I5022 Chronic systolic (congestive) heart failure: Secondary | ICD-10-CM

## 2024-12-29 ENCOUNTER — Ambulatory Visit: Payer: Self-pay | Admitting: Internal Medicine

## 2024-12-29 ENCOUNTER — Telehealth: Payer: Self-pay

## 2024-12-29 ENCOUNTER — Telehealth (HOSPITAL_COMMUNITY): Payer: Self-pay

## 2024-12-29 ENCOUNTER — Ambulatory Visit: Admitting: Internal Medicine

## 2024-12-29 VITALS — BP 112/78 | HR 90 | Temp 98.2°F | Ht 69.0 in | Wt 347.0 lb

## 2024-12-29 DIAGNOSIS — I480 Paroxysmal atrial fibrillation: Secondary | ICD-10-CM | POA: Diagnosis not present

## 2024-12-29 DIAGNOSIS — I5023 Acute on chronic systolic (congestive) heart failure: Secondary | ICD-10-CM

## 2024-12-29 DIAGNOSIS — I10 Essential (primary) hypertension: Secondary | ICD-10-CM | POA: Diagnosis not present

## 2024-12-29 DIAGNOSIS — N1832 Chronic kidney disease, stage 3b: Secondary | ICD-10-CM

## 2024-12-29 DIAGNOSIS — M2550 Pain in unspecified joint: Secondary | ICD-10-CM | POA: Diagnosis not present

## 2024-12-29 DIAGNOSIS — G4733 Obstructive sleep apnea (adult) (pediatric): Secondary | ICD-10-CM

## 2024-12-29 DIAGNOSIS — E785 Hyperlipidemia, unspecified: Secondary | ICD-10-CM | POA: Diagnosis not present

## 2024-12-29 LAB — COMPREHENSIVE METABOLIC PANEL WITH GFR
ALT: 17 U/L (ref 3–53)
AST: 18 U/L (ref 5–37)
Albumin: 3.5 g/dL (ref 3.5–5.2)
Alkaline Phosphatase: 128 U/L — ABNORMAL HIGH (ref 39–117)
BUN: 19 mg/dL (ref 6–23)
CO2: 28 meq/L (ref 19–32)
Calcium: 9.1 mg/dL (ref 8.4–10.5)
Chloride: 98 meq/L (ref 96–112)
Creatinine, Ser: 1.47 mg/dL (ref 0.40–1.50)
GFR: 53.08 mL/min — ABNORMAL LOW
Glucose, Bld: 145 mg/dL — ABNORMAL HIGH (ref 70–99)
Potassium: 4.1 meq/L (ref 3.5–5.1)
Sodium: 133 meq/L — ABNORMAL LOW (ref 135–145)
Total Bilirubin: 0.8 mg/dL (ref 0.2–1.2)
Total Protein: 8.5 g/dL — ABNORMAL HIGH (ref 6.0–8.3)

## 2024-12-29 LAB — CBC WITH DIFFERENTIAL/PLATELET
Basophils Absolute: 0.1 10*3/uL (ref 0.0–0.1)
Basophils Relative: 0.8 % (ref 0.0–3.0)
Eosinophils Absolute: 0.2 10*3/uL (ref 0.0–0.7)
Eosinophils Relative: 2.1 % (ref 0.0–5.0)
HCT: 42.6 % (ref 39.0–52.0)
Hemoglobin: 14 g/dL (ref 13.0–17.0)
Lymphocytes Relative: 29.8 % (ref 12.0–46.0)
Lymphs Abs: 2.4 10*3/uL (ref 0.7–4.0)
MCHC: 32.8 g/dL (ref 30.0–36.0)
MCV: 78.8 fl (ref 78.0–100.0)
Monocytes Absolute: 0.7 10*3/uL (ref 0.1–1.0)
Monocytes Relative: 9.2 % (ref 3.0–12.0)
Neutro Abs: 4.6 10*3/uL (ref 1.4–7.7)
Neutrophils Relative %: 58.1 % (ref 43.0–77.0)
Platelets: 367 10*3/uL (ref 150.0–400.0)
RBC: 5.4 Mil/uL (ref 4.22–5.81)
RDW: 17.5 % — ABNORMAL HIGH (ref 11.5–15.5)
WBC: 7.9 10*3/uL (ref 4.0–10.5)

## 2024-12-29 LAB — SEDIMENTATION RATE: Sed Rate: 127 mm/h — ABNORMAL HIGH (ref 0–20)

## 2024-12-29 LAB — URIC ACID: Uric Acid, Serum: 9.1 mg/dL — ABNORMAL HIGH (ref 4.0–7.8)

## 2024-12-29 LAB — C-REACTIVE PROTEIN: CRP: 1.8 mg/dL (ref 1.0–20.0)

## 2024-12-29 MED ORDER — HYDROCODONE-ACETAMINOPHEN 5-325 MG PO TABS: 1.0000 | ORAL_TABLET | Freq: Three times a day (TID) | ORAL | 0 refills | Status: AC | PRN

## 2024-12-29 NOTE — Progress Notes (Signed)
 Complex Care Management Note  Care Guide Note 12/29/2024 Name: [name] MRN: [id number] DOB: 1966/03/12  [name] Silence is a 59 y.o. year old male who sees Joshua Debby LITTIE, MD for primary care. I reached out to [name] Silence by phone today to offer complex care management services.  [name] was given information about Complex Care Management services today including:   The Complex Care Management services include support from the care team which includes your Nurse Care Manager, Clinical Social Worker, or Pharmacist.  The Complex Care Management team is here to help remove barriers to the health concerns and goals most important to you. Complex Care Management services are voluntary, and the patient may decline or stop services at any time by request to their care team member.   Complex Care Management Consent Status: Patient agreed to services and verbal consent obtained.   Follow up plan:  Telephone appointment with complex care management team member scheduled for:  01/02/2025 and 01/11/2025.  Encounter Outcome:  Patient Scheduled  [facility]  [facility], [facility] Guide Direct Dial: [phone]  Fax: [phone]

## 2024-12-29 NOTE — Patient Instructions (Addendum)
" ° °  Have blood work done.    Make an appointment with sports medicine downstairs - joint pain.    Reschedule your sleep study.   See Cardiology tomorrow.      Medications changes include :   vicodin for pain    A referral was ordered sports medicine and rehab and pain management and someone will call you to schedule an appointment.   "

## 2024-12-29 NOTE — Progress Notes (Signed)
 31 day ICM Remote transmission canceled due to [facility] clinic is on hold until further notice.  91 day remote monitoring will continue per protocol.

## 2024-12-29 NOTE — Telephone Encounter (Signed)
 Called to confirm/remind patient of their appointment at the [facility] on 12/29/24.   Appointment:   [x] Confirmed  [] Left mess   [] No answer/No voice mail  [] VM Full/unable to leave message  [] Phone not in service  Patient reminded to bring all medications and/or complete list.  Confirmed patient has transportation. Gave directions, instructed to utilize valet parking.

## 2024-12-30 ENCOUNTER — Ambulatory Visit (HOSPITAL_COMMUNITY): Admit: 2024-12-30 | Discharge: 2024-12-30 | Disposition: A | Attending: Cardiology

## 2024-12-30 ENCOUNTER — Ambulatory Visit: Admitting: Family Medicine

## 2024-12-30 ENCOUNTER — Encounter (HOSPITAL_COMMUNITY): Payer: Self-pay

## 2024-12-30 VITALS — BP 122/78 | HR 92 | Ht 69.0 in | Wt 350.6 lb

## 2024-12-30 VITALS — BP 122/78 | HR 92 | Ht 69.0 in | Wt 350.0 lb

## 2024-12-30 DIAGNOSIS — E8770 Fluid overload, unspecified: Secondary | ICD-10-CM | POA: Diagnosis not present

## 2024-12-30 DIAGNOSIS — I130 Hypertensive heart and chronic kidney disease with heart failure and stage 1 through stage 4 chronic kidney disease, or unspecified chronic kidney disease: Secondary | ICD-10-CM | POA: Diagnosis not present

## 2024-12-30 DIAGNOSIS — E1122 Type 2 diabetes mellitus with diabetic chronic kidney disease: Secondary | ICD-10-CM | POA: Insufficient documentation

## 2024-12-30 DIAGNOSIS — Z86711 Personal history of pulmonary embolism: Secondary | ICD-10-CM | POA: Diagnosis not present

## 2024-12-30 DIAGNOSIS — M109 Gout, unspecified: Secondary | ICD-10-CM | POA: Insufficient documentation

## 2024-12-30 DIAGNOSIS — M2550 Pain in unspecified joint: Secondary | ICD-10-CM

## 2024-12-30 DIAGNOSIS — N1832 Chronic kidney disease, stage 3b: Secondary | ICD-10-CM | POA: Diagnosis not present

## 2024-12-30 DIAGNOSIS — I480 Paroxysmal atrial fibrillation: Secondary | ICD-10-CM | POA: Insufficient documentation

## 2024-12-30 DIAGNOSIS — F410 Panic disorder [episodic paroxysmal anxiety] without agoraphobia: Secondary | ICD-10-CM | POA: Diagnosis not present

## 2024-12-30 DIAGNOSIS — I5084 End stage heart failure: Secondary | ICD-10-CM | POA: Diagnosis not present

## 2024-12-30 DIAGNOSIS — M1039 Gout due to renal impairment, multiple sites: Secondary | ICD-10-CM | POA: Diagnosis not present

## 2024-12-30 DIAGNOSIS — F32A Depression, unspecified: Secondary | ICD-10-CM | POA: Diagnosis not present

## 2024-12-30 DIAGNOSIS — F419 Anxiety disorder, unspecified: Secondary | ICD-10-CM | POA: Insufficient documentation

## 2024-12-30 DIAGNOSIS — M353 Polymyalgia rheumatica: Secondary | ICD-10-CM

## 2024-12-30 DIAGNOSIS — I428 Other cardiomyopathies: Secondary | ICD-10-CM | POA: Diagnosis not present

## 2024-12-30 DIAGNOSIS — I5022 Chronic systolic (congestive) heart failure: Secondary | ICD-10-CM | POA: Insufficient documentation

## 2024-12-30 DIAGNOSIS — M1A00X0 Idiopathic chronic gout, unspecified site, without tophus (tophi): Secondary | ICD-10-CM

## 2024-12-30 DIAGNOSIS — I10 Essential (primary) hypertension: Secondary | ICD-10-CM | POA: Diagnosis not present

## 2024-12-30 DIAGNOSIS — I5023 Acute on chronic systolic (congestive) heart failure: Secondary | ICD-10-CM

## 2024-12-30 DIAGNOSIS — I472 Ventricular tachycardia, unspecified: Secondary | ICD-10-CM | POA: Diagnosis not present

## 2024-12-30 DIAGNOSIS — Z79899 Other long term (current) drug therapy: Secondary | ICD-10-CM | POA: Diagnosis not present

## 2024-12-30 DIAGNOSIS — E785 Hyperlipidemia, unspecified: Secondary | ICD-10-CM | POA: Diagnosis not present

## 2024-12-30 DIAGNOSIS — Z7901 Long term (current) use of anticoagulants: Secondary | ICD-10-CM | POA: Insufficient documentation

## 2024-12-30 DIAGNOSIS — E0590 Thyrotoxicosis, unspecified without thyrotoxic crisis or storm: Secondary | ICD-10-CM

## 2024-12-30 DIAGNOSIS — Z7985 Long-term (current) use of injectable non-insulin antidiabetic drugs: Secondary | ICD-10-CM | POA: Insufficient documentation

## 2024-12-30 DIAGNOSIS — Z95810 Presence of automatic (implantable) cardiac defibrillator: Secondary | ICD-10-CM | POA: Insufficient documentation

## 2024-12-30 DIAGNOSIS — G4733 Obstructive sleep apnea (adult) (pediatric): Secondary | ICD-10-CM | POA: Diagnosis not present

## 2024-12-30 DIAGNOSIS — F17210 Nicotine dependence, cigarettes, uncomplicated: Secondary | ICD-10-CM | POA: Diagnosis not present

## 2024-12-30 DIAGNOSIS — R700 Elevated erythrocyte sedimentation rate: Secondary | ICD-10-CM

## 2024-12-30 DIAGNOSIS — I4901 Ventricular fibrillation: Secondary | ICD-10-CM | POA: Diagnosis not present

## 2024-12-30 MED ORDER — POTASSIUM CHLORIDE CRYS ER 20 MEQ PO TBCR: 60.0000 meq | EXTENDED_RELEASE_TABLET | Freq: Every day | ORAL | 3 refills | Status: AC

## 2024-12-30 MED ORDER — METOLAZONE 2.5 MG PO TABS: ORAL_TABLET | ORAL | 0 refills | Status: AC

## 2024-12-30 MED ORDER — ALLOPURINOL 100 MG PO TABS: 50.0000 mg | ORAL_TABLET | Freq: Every day | ORAL | 3 refills | Status: AC

## 2024-12-30 MED ORDER — TORSEMIDE 20 MG PO TABS: ORAL_TABLET | ORAL | 6 refills | Status: AC

## 2024-12-30 MED ORDER — PREDNISONE 20 MG PO TABS: 20.0000 mg | ORAL_TABLET | Freq: Every day | ORAL | 0 refills | Status: AC

## 2024-12-30 MED ORDER — PREDNISONE 20 MG PO TABS: ORAL_TABLET | ORAL | 0 refills | Status: AC

## 2024-12-30 NOTE — Patient Instructions (Addendum)
 Thank you for coming in today.   Prescription sent in for oral Prednisone .   Referral placed to [facility]. 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   See you back if symptoms don't improve, or as needed.

## 2024-12-30 NOTE — Progress Notes (Signed)
 "        I, Ileana Collet, PhD, LAT, ATC acting as a scribe for Artist Lloyd, MD.  [name] is a 59 y.o. male who presents to [facility] today for polyarthralgia ongoing for months. He notes several recent hospital stays and ED visits November thru January w/ heart failure and heart cath. He is experiencing severe joint pain, especially in his shoulders, wrists, and knees. Pain is effecting his mobility and ADLs. PCP did labs yesterday revealing elevated uric acid.  Cardiology today prescribed prednisone  40 mg for 3 days, allopurinol  50 mg and higher dose torsemide  along with potassium.  Hx of gout in his Great toe  Treatments tried: prednisone   Pertinent review of systems: No fevers or chills.  Relevant historical information: Heart failure pulmonary embolism heart disease.  Recently discharged with heart failure exacerbation.  Being considered for LVAD. Chronic gout   Exam:  BP 122/78   Pulse 92   Ht 5' 9 (1.753 m)   Wt (!) 350 lb (158.8 kg)   SpO2 98%   BMI 51.69 kg/m  General: Well Developed, well nourished, and in no acute distress.   MSK: Shoulders bilaterally decreased range of motion.  Diffusely tender to palpation.  Strength reduced abduction. Hands bilaterally synovitis and swelling is present.  Decreased range of motion lacks full flexion.    Lab and Radiology Results Results for orders placed or performed in visit on 12/29/24 (from the past 72 hours)  CBC with Differential/Platelet     Status: Abnormal   Collection Time: 12/29/24 11:56 AM  Result Value Ref Range   WBC 7.9 4.0 - 10.5 K/uL   RBC 5.40 4.22 - 5.81 Mil/uL   Hemoglobin 14.0 13.0 - 17.0 g/dL   HCT 57.3 60.9 - 47.9 %   MCV 78.8 78.0 - 100.0 fl   MCHC 32.8 30.0 - 36.0 g/dL   RDW 82.4 (H) 88.4 - 84.4 %   Platelets 367.0 150.0 - 400.0 K/uL   Neutrophils Relative % 58.1 43.0 - 77.0 %   Lymphocytes Relative 29.8 12.0 - 46.0 %   Monocytes Relative 9.2 3.0 - 12.0 %    Eosinophils Relative 2.1 0.0 - 5.0 %   Basophils Relative 0.8 0.0 - 3.0 %   Neutro Abs 4.6 1.4 - 7.7 K/uL   Lymphs Abs 2.4 0.7 - 4.0 K/uL   Monocytes Absolute 0.7 0.1 - 1.0 K/uL   Eosinophils Absolute 0.2 0.0 - 0.7 K/uL   Basophils Absolute 0.1 0.0 - 0.1 K/uL  Comprehensive metabolic panel with GFR     Status: Abnormal   Collection Time: 12/29/24 11:56 AM  Result Value Ref Range   Sodium 133 (L) 135 - 145 mEq/L   Potassium 4.1 3.5 - 5.1 mEq/L   Chloride 98 96 - 112 mEq/L   CO2 28 19 - 32 mEq/L    Comment: Elevated LDH levels may cause falsely increased CO2 results. If LDH is >2000 U/L, a positive bias of 12% is possible.   Glucose, Bld 145 (H) 70 - 99 mg/dL   BUN 19 6 - 23 mg/dL   Creatinine, Ser 8.52 0.40 - 1.50 mg/dL   Total Bilirubin 0.8 0.2 - 1.2 mg/dL   Alkaline Phosphatase 128 (H) 39 - 117 U/L   AST 18 5 - 37 U/L   ALT 17 3 - 53 U/L   Total Protein 8.5 (H) 6.0 - 8.3 g/dL   Albumin  3.5 3.5 - 5.2 g/dL  GFR 53.08 (L) >60.00 mL/min    Comment: Calculated using the CKD-EPI Creatinine Equation (2021)   Calcium  9.1 8.4 - 10.5 mg/dL  Uric acid     Status: Abnormal   Collection Time: 12/29/24 11:56 AM  Result Value Ref Range   Uric Acid, Serum 9.1 (H) 4.0 - 7.8 mg/dL  Sedimentation rate     Status: Abnormal   Collection Time: 12/29/24 11:56 AM  Result Value Ref Range   Sed Rate 127 (H) 0 - 20 mm/hr  C-reactive protein     Status: None   Collection Time: 12/29/24 11:56 AM  Result Value Ref Range   CRP 1.8 1.0 - 20.0 mg/dL   *Note: Due to a large number of results and/or encounters for the requested time period, some results have not been displayed. A complete set of results can be found in Results Review.   No results found.     Assessment and Plan: 59 y.o. male with diffuse myalgias and arthralgias occurring in the setting of an elevated sed rate of 127 and an elevated uric acid at 9.1.  These labs and symptoms and physical exam findings strongly correlate with  rheumatologic condition such as polymyalgia rheumatica.  Gout is less likely especially given his more diffuse pain.  I agree with cardiology starting prednisone .  He is already prescribed prednisone  40 for 3 days.  I have also prescribed prednisone  20 mg that he can start taking afterwards for total of 2 weeks.  Will refer to rheumatology ASAP.  I am concerned for polymyalgia rheumatica.  Unfortunately there may be not good options.  His prednisone  will exacerbate his heart failure and his torsemide  will exacerbate uric acid.   PDMP not reviewed this encounter. Orders Placed This Encounter  Procedures   Ambulatory referral to Rheumatology    Referral Priority:   Routine    Referral Type:   Consultation    Referral Reason:   Specialty Services Required    Requested Specialty:   Rheumatology    Number of Visits Requested:   1   Meds ordered this encounter  Medications   predniSONE  (DELTASONE ) 20 MG tablet    Sig: Take 1 tablet (20 mg total) by mouth daily. Following 40mg  dose prescribed by cardiology    Dispense:  14 tablet    Refill:  0     Discussed warning signs or symptoms. Please see discharge instructions. Patient expresses understanding.   The above documentation has been reviewed and is accurate and complete Artist Lloyd, M.D.   "

## 2024-12-30 NOTE — Patient Instructions (Addendum)
 Medication Changes:  START PREDNISONE  40MG  DAILY FOR 3 DAYS   START ALLOPURINOL  50MG  ONCE DAILY   INCREASE TORSEMIDE  TO 100 MG IN THE MORNING AND 40MG  IN THE EVENING   INCREASE POTASSIUM TO ONCE DAILY   START METOLAZONE  2.5MG  TODAY, AND TOMORROW-- TAKE AN EXTRA OF POTASSIUM WITH THIS   Lab Work:  LABS NEXT WEEK AT APPOINTMENT   PLEASE CALL TO RESCHEDULE YOUR SLEEP STUDY   Follow-Up in: NEXT WEEK AS SCHEDULED WITH APP CLINIC   At the [facility], you and your health needs are our priority. We have a designated team specialized in the treatment of Heart Failure. This Care Team includes your primary Heart Failure Specialized Cardiologist (physician), Advanced Practice Providers (APPs- Physician Assistants and Nurse Practitioners), and Pharmacist who all work together to provide you with the care you need, when you need it.   You may see any of the following providers on your designated Care Team at your next follow up:  Dr. Toribio Fuel Dr. Ezra Shuck Dr. Odis Brownie Greig Mosses, NP Caffie Shed, [place] [facility] [place], [place] Beckey Coe, NP Jordan Lee, NP Tinnie Redman, PharmD   Please be sure to bring in all your medications bottles to every appointment.   Need to Contact Us :  If you have any questions or concerns before your next appointment please send us  a message through [place] or call our office at [phone].    TO LEAVE A MESSAGE FOR THE NURSE SELECT OPTION 2, PLEASE LEAVE A MESSAGE INCLUDING: YOUR NAME DATE OF BIRTH CALL BACK NUMBER REASON FOR CALL**this is important as we prioritize the call backs  YOU WILL RECEIVE A CALL BACK THE SAME DAY AS LONG AS YOU CALL BEFORE 4:00 PM

## 2025-01-02 ENCOUNTER — Telehealth: Admitting: Psychiatry

## 2025-01-02 ENCOUNTER — Encounter: Payer: Self-pay | Admitting: Psychiatry

## 2025-01-02 ENCOUNTER — Ambulatory Visit: Payer: Self-pay

## 2025-01-02 ENCOUNTER — Other Ambulatory Visit: Payer: Self-pay

## 2025-01-02 DIAGNOSIS — G4700 Insomnia, unspecified: Secondary | ICD-10-CM

## 2025-01-02 DIAGNOSIS — F3175 Bipolar disorder, in partial remission, most recent episode depressed: Secondary | ICD-10-CM | POA: Diagnosis not present

## 2025-01-02 DIAGNOSIS — F410 Panic disorder [episodic paroxysmal anxiety] without agoraphobia: Secondary | ICD-10-CM

## 2025-01-02 DIAGNOSIS — F4310 Post-traumatic stress disorder, unspecified: Secondary | ICD-10-CM

## 2025-01-02 LAB — ANA: Anti Nuclear Antibody (ANA): POSITIVE — AB

## 2025-01-02 LAB — ANTI-NUCLEAR AB-TITER (ANA TITER)
ANA TITER: 1:80 {titer} — ABNORMAL HIGH
ANA Titer 1: 1:80 {titer} — ABNORMAL HIGH

## 2025-01-02 LAB — RHEUMATOID FACTOR: Rheumatoid fact SerPl-aCnc: 72 [IU]/mL — ABNORMAL HIGH

## 2025-01-02 LAB — CYCLIC CITRUL PEPTIDE ANTIBODY, IGG: Cyclic Citrullin Peptide Ab: >250 U — ABNORMAL HIGH

## 2025-01-02 MED ORDER — RAMELTEON 8 MG PO TABS: 8.0000 mg | ORAL_TABLET | Freq: Every day | ORAL | 1 refills | Status: AC

## 2025-01-02 NOTE — Patient Instructions (Signed)
 Visit Information  Thank you for taking time to visit with me today. Please don't hesitate to contact me if I can be of assistance to you before our next scheduled appointment.  Our next appointment is no further scheduled appointments.   Please call the care guide team at [phone] if you need to cancel or reschedule your appointment.   Following is a copy of your care plan:   Goals Addressed             This Visit's Progress    BSW Visit       Current SDOH Barriers:  Limited access to food Housing barriers  Interventions: Patient interviewed and appropriate screenings performed Referred patient to community resources           Please call the Suicide and Crisis Lifeline: 988 call the [place]  National Suicide Prevention Lifeline: (463)273-4305 or TTY: 548-045-0289 TTY (812)747-5997) to talk to a trained counselor call 1-800-273-TALK (toll free, 24 hour hotline) go to [facility] [address] [place] [phone]) call 911 if you are experiencing a Mental Health or Behavioral Health Crisis or need someone to talk to.  Patient verbalized understanding of Care plan and visit instructions communicated this visit  Orlean Fey, BSW [facility]  [facility] Social Worker, [employer] [facility] [phone]

## 2025-01-02 NOTE — Patient Outreach (Signed)
 Social Drivers of Health  Community Resource and Care Coordination Visit Note   01/02/2025  Name: [name] MRN: [id number] DOB:08-Jan-1966  Situation: Referral received for [facility] needs assessment and assistance related to Housing  Financial [employer] . I obtained verbal consent from Patient.  Visit completed with Patient on the phone.   Background:   SDOH Interventions Today    Flowsheet Row Most Recent Value  SDOH Interventions   Food Insecurity Interventions Community Resources Provided  Transportation Interventions Community Resources Provided  Financial Strain Interventions Community Resources Provided     Assessment:   BSW outreached patient today to assess for SDOH barriers. During the call, housing and food insecurity were identified as current barriers for the patient. Patient reported that he is currently living with his ex-partner and has sole custody of his 44 year old son. Patient stated that he would like to move out and secure affordable housing for himself and his son, as the current living arrangement is not ideal long-term. Regarding food needs, patient stated that he currently receives SNAP benefits; however, he reported that the benefits are sometimes not sufficient to cover food for both him and his son throughout the month. In regard to transportation, patient stated that he does have access to transportation but is sometimes unable to get around due to his medical condition. Patient reported that he is able to receive rides from family members or friends when needed. BSW informed patient that he may also qualify for transportation benefits through his insurance plan and encouraged him to contact his insurance provider to review available transportation services and other covered benefits. BSW discussed available community resources. BSW will send patient a list of local food pantries and affordable housing resources to assist with his current needs. BSW  advised to reach out if he had further questions.    Goals Addressed             This Visit's Progress    BSW Visit       Current SDOH Barriers:  Limited access to food Housing barriers  Interventions: Patient interviewed and appropriate screenings performed Referred patient to community resources           Recommendation:   attend all scheduled provider appointments call for transportation assistance at least one week before appointments ask for help if you don't understand your health insurance benefits  Follow Up Plan:   Patient has achieved all patient stated goals. [employer] will be closed. Patient has been provided contact information should new needs arise.   Orlean Fey, BSW [place]  [facility] Social Worker, [employer] [facility] [phone]

## 2025-01-02 NOTE — Telephone Encounter (Signed)
 FYI Only or Action Required?: FYI only for provider: condition update.  Patient was last seen in primary care on 12/29/2024 by [name], MD.  Called Nurse Triage reporting Generalized Body Aches.  Symptoms began several weeks ago.  Interventions attempted: Prescription medications: steroids.  Symptoms are: gradually improving.  Triage Disposition: No disposition on file.  Patient/caregiver understands and will follow disposition?: Yes  Reason for Triage: [employer] nurse rep,Alana, is not with the patient but  the patient said he has extremely severe 10 out of 10 pain all over his body, main complaints are arms, legs, hands, shoulders, and over the counter medication isnt helping.   Answer Assessment - Initial Assessment Questions [employer] nurse calling in concerned that pt was expressing extreme body pain.Called pt and reviewed med rx at last appt for pain. States hydrocodone  doesn't help but pain has improved since starting steroids, able to walk and move normally.     1. ONSET: When did the muscle aches or body pains start?      weeks 2. LOCATION: What part of your body is hurting? (e.g., entire body, arms, legs)      joints 3. SEVERITY: How bad is the pain? (Scale 1-10; or mild, moderate, severe)     moderate 4. CAUSE: What do you think is causing the pains?     arthritis 5. FEVER: Do you have a fever? If Yes, ask: What is your temperature, how was it measured, and  when did it start?      no  Protocols used: Muscle Aches and Body Pain-A-AH

## 2025-01-03 NOTE — Telephone Encounter (Signed)
 Copied from CRM [id number]. Topic: General - Other >> Jan 03, 2025 11:17 AM Burnard DEL wrote: Reason for CRM: Patient returned call to [facility], Patient would like a return call once CMA is available.

## 2025-01-03 NOTE — Telephone Encounter (Signed)
 Patient states that he has been seen for this and they have a plan to get this handled.

## 2025-01-03 NOTE — Telephone Encounter (Signed)
 Unable to reach patient. LMTRC

## 2025-01-06 ENCOUNTER — Telehealth: Payer: Self-pay

## 2025-01-06 ENCOUNTER — Ambulatory Visit (HOSPITAL_COMMUNITY)

## 2025-01-06 NOTE — Progress Notes (Unsigned)
 Complex Care Management Care Guide Note  01/06/2025 Name: [name] MRN: [id number] DOB: 05-21-1966  [name] is a 59 y.o. year old male who is a primary care patient of Joshua Debby LITTIE, MD and is actively engaged with the care management team. I reached out to [name] by phone today to assist with re-scheduling  with the BSW.  Follow up plan: PT states he completed his appt on 01/02/25. His appt was scheduled for 10 am but he requested a cb at noon and states it was completed at that time w/ BSW.    Doyce Razor [facility], [facility] Guide Direct Dial: [phone]  Fax: [phone]

## 2025-01-07 ENCOUNTER — Ambulatory Visit

## 2025-01-09 ENCOUNTER — Ambulatory Visit

## 2025-01-11 ENCOUNTER — Telehealth

## 2025-01-13 ENCOUNTER — Ambulatory Visit (HOSPITAL_COMMUNITY)

## 2025-01-24 ENCOUNTER — Ambulatory Visit: Payer: Medicare HMO

## 2025-02-10 ENCOUNTER — Telehealth: Admitting: Psychiatry

## 2025-02-13 ENCOUNTER — Other Ambulatory Visit

## 2025-02-15 ENCOUNTER — Ambulatory Visit: Admitting: "Endocrinology

## 2025-04-25 ENCOUNTER — Ambulatory Visit

## 2025-07-25 ENCOUNTER — Ambulatory Visit

## 2025-10-24 ENCOUNTER — Ambulatory Visit

## 2026-01-23 ENCOUNTER — Ambulatory Visit
